# Patient Record
Sex: Female | Born: 1945 | Race: White | Hispanic: No | Marital: Single | Smoking: Never smoker
Health system: Southern US, Community
[De-identification: ages and names within clinical notes are randomized; demographics above are authoritative.]

## PROBLEM LIST (undated history)

## (undated) DIAGNOSIS — N186 End stage renal disease: Secondary | ICD-10-CM

## (undated) DIAGNOSIS — E785 Hyperlipidemia, unspecified: Secondary | ICD-10-CM

## (undated) DIAGNOSIS — I5030 Unspecified diastolic (congestive) heart failure: Secondary | ICD-10-CM

## (undated) DIAGNOSIS — Z9289 Personal history of other medical treatment: Secondary | ICD-10-CM

## (undated) DIAGNOSIS — I779 Disorder of arteries and arterioles, unspecified: Secondary | ICD-10-CM

## (undated) DIAGNOSIS — J449 Chronic obstructive pulmonary disease, unspecified: Secondary | ICD-10-CM

## (undated) DIAGNOSIS — G8929 Other chronic pain: Secondary | ICD-10-CM

## (undated) DIAGNOSIS — T7840XA Allergy, unspecified, initial encounter: Secondary | ICD-10-CM

## (undated) DIAGNOSIS — J45909 Unspecified asthma, uncomplicated: Secondary | ICD-10-CM

## (undated) DIAGNOSIS — I10 Essential (primary) hypertension: Secondary | ICD-10-CM

## (undated) DIAGNOSIS — Z95828 Presence of other vascular implants and grafts: Secondary | ICD-10-CM

## (undated) DIAGNOSIS — E118 Type 2 diabetes mellitus with unspecified complications: Secondary | ICD-10-CM

## (undated) DIAGNOSIS — I2510 Atherosclerotic heart disease of native coronary artery without angina pectoris: Secondary | ICD-10-CM

## (undated) DIAGNOSIS — G4730 Sleep apnea, unspecified: Secondary | ICD-10-CM

## (undated) DIAGNOSIS — I350 Nonrheumatic aortic (valve) stenosis: Secondary | ICD-10-CM

## (undated) DIAGNOSIS — D638 Anemia in other chronic diseases classified elsewhere: Secondary | ICD-10-CM

## (undated) DIAGNOSIS — Z992 Dependence on renal dialysis: Secondary | ICD-10-CM

## (undated) DIAGNOSIS — I509 Heart failure, unspecified: Secondary | ICD-10-CM

## (undated) DIAGNOSIS — M549 Dorsalgia, unspecified: Secondary | ICD-10-CM

## (undated) DIAGNOSIS — F419 Anxiety disorder, unspecified: Secondary | ICD-10-CM

## (undated) DIAGNOSIS — M109 Gout, unspecified: Secondary | ICD-10-CM

## (undated) DIAGNOSIS — L988 Other specified disorders of the skin and subcutaneous tissue: Secondary | ICD-10-CM

## (undated) DIAGNOSIS — I700 Atherosclerosis of aorta: Secondary | ICD-10-CM

## (undated) DIAGNOSIS — K219 Gastro-esophageal reflux disease without esophagitis: Secondary | ICD-10-CM

## (undated) DIAGNOSIS — N289 Disorder of kidney and ureter, unspecified: Secondary | ICD-10-CM

## (undated) HISTORY — PX: GALLBLADDER SURGERY: SHX652

## (undated) HISTORY — DX: Anxiety disorder, unspecified: F41.9

## (undated) HISTORY — DX: Type 2 diabetes mellitus with unspecified complications: E11.8

## (undated) HISTORY — DX: Unspecified diastolic (congestive) heart failure: I50.30

## (undated) HISTORY — DX: Other chronic pain: G89.29

## (undated) HISTORY — DX: Allergy, unspecified, initial encounter: T78.40XA

## (undated) HISTORY — PX: OTHER SURGICAL HISTORY: SHX169

## (undated) HISTORY — DX: Heart failure, unspecified: I50.9

## (undated) HISTORY — DX: Essential (primary) hypertension: I10

## (undated) HISTORY — PX: CARDIAC CATHETERIZATION: SHX172

## (undated) HISTORY — DX: Personal history of other medical treatment: Z92.89

## (undated) HISTORY — DX: Chronic obstructive pulmonary disease, unspecified: J44.9

## (undated) HISTORY — DX: Anemia in other chronic diseases classified elsewhere: D63.8

## (undated) HISTORY — DX: Dorsalgia, unspecified: M54.9

## (undated) HISTORY — DX: End stage renal disease: N18.6

## (undated) HISTORY — DX: Unspecified asthma, uncomplicated: J45.909

## (undated) HISTORY — DX: Disorder of arteries and arterioles, unspecified: I77.9

## (undated) HISTORY — DX: Hyperlipidemia, unspecified: E78.5

## (undated) HISTORY — DX: Atherosclerosis of aorta: I70.0

## (undated) HISTORY — DX: Dependence on renal dialysis: Z99.2

---
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
## 2004-10-25 ENCOUNTER — Ambulatory Visit: Payer: Self-pay | Admitting: Physician Assistant

## 2005-02-21 ENCOUNTER — Ambulatory Visit: Payer: Self-pay | Admitting: Physician Assistant

## 2005-04-24 ENCOUNTER — Ambulatory Visit: Payer: Self-pay | Admitting: Internal Medicine

## 2005-05-03 ENCOUNTER — Ambulatory Visit: Payer: Self-pay | Admitting: Internal Medicine

## 2005-05-12 ENCOUNTER — Other Ambulatory Visit: Payer: Self-pay

## 2005-05-16 ENCOUNTER — Ambulatory Visit: Payer: Self-pay | Admitting: General Surgery

## 2005-06-20 ENCOUNTER — Ambulatory Visit: Payer: Self-pay | Admitting: Physician Assistant

## 2005-06-28 ENCOUNTER — Encounter: Payer: Self-pay | Admitting: Physician Assistant

## 2005-06-30 ENCOUNTER — Ambulatory Visit: Payer: Self-pay | Admitting: Internal Medicine

## 2005-07-07 ENCOUNTER — Ambulatory Visit: Payer: Self-pay | Admitting: Internal Medicine

## 2005-07-21 ENCOUNTER — Encounter: Payer: Self-pay | Admitting: Physician Assistant

## 2005-07-28 ENCOUNTER — Ambulatory Visit: Payer: Self-pay | Admitting: Internal Medicine

## 2005-10-19 ENCOUNTER — Ambulatory Visit: Payer: Self-pay | Admitting: Physician Assistant

## 2006-01-17 ENCOUNTER — Ambulatory Visit: Payer: Self-pay | Admitting: Pain Medicine

## 2006-02-01 ENCOUNTER — Ambulatory Visit: Payer: Self-pay | Admitting: Pain Medicine

## 2006-02-13 ENCOUNTER — Ambulatory Visit: Payer: Self-pay | Admitting: Physician Assistant

## 2006-03-12 ENCOUNTER — Ambulatory Visit: Payer: Self-pay | Admitting: Physician Assistant

## 2006-03-16 ENCOUNTER — Ambulatory Visit: Payer: Self-pay | Admitting: Internal Medicine

## 2006-03-29 ENCOUNTER — Ambulatory Visit: Payer: Self-pay | Admitting: Internal Medicine

## 2006-05-15 ENCOUNTER — Ambulatory Visit: Payer: Self-pay

## 2006-05-18 ENCOUNTER — Ambulatory Visit: Payer: Self-pay | Admitting: General Surgery

## 2006-05-28 ENCOUNTER — Ambulatory Visit: Payer: Self-pay | Admitting: Physician Assistant

## 2006-06-06 ENCOUNTER — Ambulatory Visit: Payer: Self-pay | Admitting: Unknown Physician Specialty

## 2006-08-16 ENCOUNTER — Encounter: Payer: Self-pay | Admitting: Rheumatology

## 2006-08-20 ENCOUNTER — Encounter: Payer: Self-pay | Admitting: Rheumatology

## 2006-08-20 ENCOUNTER — Ambulatory Visit: Payer: Self-pay | Admitting: Pain Medicine

## 2006-09-17 ENCOUNTER — Ambulatory Visit: Payer: Self-pay | Admitting: Specialist

## 2006-09-20 ENCOUNTER — Encounter: Payer: Self-pay | Admitting: Rheumatology

## 2006-11-21 ENCOUNTER — Ambulatory Visit: Payer: Self-pay | Admitting: Physician Assistant

## 2007-01-14 ENCOUNTER — Ambulatory Visit: Payer: Self-pay | Admitting: Pain Medicine

## 2007-01-24 ENCOUNTER — Ambulatory Visit: Payer: Self-pay | Admitting: Pain Medicine

## 2007-02-18 ENCOUNTER — Ambulatory Visit: Payer: Self-pay | Admitting: Physician Assistant

## 2007-02-26 ENCOUNTER — Ambulatory Visit: Payer: Self-pay | Admitting: Specialist

## 2007-02-28 ENCOUNTER — Ambulatory Visit: Payer: Self-pay | Admitting: Pain Medicine

## 2007-03-18 ENCOUNTER — Ambulatory Visit: Payer: Self-pay | Admitting: Pain Medicine

## 2007-03-25 ENCOUNTER — Ambulatory Visit: Payer: Self-pay | Admitting: Pain Medicine

## 2007-04-30 ENCOUNTER — Ambulatory Visit: Payer: Self-pay | Admitting: Pain Medicine

## 2007-05-15 ENCOUNTER — Ambulatory Visit: Payer: Self-pay | Admitting: Physician Assistant

## 2007-06-06 ENCOUNTER — Ambulatory Visit: Payer: Self-pay | Admitting: Pain Medicine

## 2007-07-01 ENCOUNTER — Ambulatory Visit: Payer: Self-pay | Admitting: Pain Medicine

## 2007-07-11 ENCOUNTER — Ambulatory Visit: Payer: Self-pay | Admitting: Pain Medicine

## 2007-08-12 ENCOUNTER — Ambulatory Visit: Payer: Self-pay | Admitting: Pain Medicine

## 2007-10-07 ENCOUNTER — Ambulatory Visit: Payer: Self-pay | Admitting: Physician Assistant

## 2007-11-04 ENCOUNTER — Ambulatory Visit: Payer: Self-pay | Admitting: Physician Assistant

## 2007-11-28 ENCOUNTER — Ambulatory Visit: Payer: Self-pay | Admitting: Pain Medicine

## 2007-12-30 ENCOUNTER — Ambulatory Visit: Payer: Self-pay | Admitting: Physician Assistant

## 2008-01-14 ENCOUNTER — Encounter: Payer: Self-pay | Admitting: Physician Assistant

## 2008-01-19 ENCOUNTER — Encounter: Payer: Self-pay | Admitting: Physician Assistant

## 2008-01-30 ENCOUNTER — Ambulatory Visit: Payer: Self-pay | Admitting: Physician Assistant

## 2008-02-03 ENCOUNTER — Ambulatory Visit: Payer: Self-pay | Admitting: Internal Medicine

## 2008-04-07 ENCOUNTER — Ambulatory Visit: Payer: Self-pay | Admitting: Physician Assistant

## 2008-06-15 ENCOUNTER — Ambulatory Visit: Payer: Self-pay | Admitting: Physician Assistant

## 2008-07-28 ENCOUNTER — Ambulatory Visit: Payer: Self-pay | Admitting: Physician Assistant

## 2008-08-17 ENCOUNTER — Ambulatory Visit: Payer: Self-pay | Admitting: Internal Medicine

## 2008-09-02 ENCOUNTER — Ambulatory Visit: Payer: Self-pay | Admitting: Specialist

## 2008-11-24 ENCOUNTER — Ambulatory Visit: Payer: Self-pay | Admitting: Physician Assistant

## 2008-12-24 ENCOUNTER — Emergency Department: Payer: Self-pay

## 2008-12-31 ENCOUNTER — Encounter: Payer: Self-pay | Admitting: Unknown Physician Specialty

## 2009-03-02 ENCOUNTER — Ambulatory Visit: Payer: Self-pay | Admitting: Physician Assistant

## 2009-04-28 ENCOUNTER — Ambulatory Visit: Payer: Self-pay | Admitting: Physician Assistant

## 2009-07-28 ENCOUNTER — Ambulatory Visit: Payer: Self-pay | Admitting: Physician Assistant

## 2009-11-09 ENCOUNTER — Ambulatory Visit: Payer: Self-pay | Admitting: Physician Assistant

## 2009-12-06 ENCOUNTER — Ambulatory Visit: Payer: Self-pay | Admitting: Pain Medicine

## 2009-12-29 ENCOUNTER — Ambulatory Visit: Payer: Self-pay | Admitting: Pain Medicine

## 2010-02-25 ENCOUNTER — Encounter: Payer: Self-pay | Admitting: Internal Medicine

## 2010-03-20 ENCOUNTER — Encounter: Payer: Self-pay | Admitting: Internal Medicine

## 2010-04-11 ENCOUNTER — Ambulatory Visit: Payer: Self-pay | Admitting: Internal Medicine

## 2010-04-20 ENCOUNTER — Encounter: Payer: Self-pay | Admitting: Internal Medicine

## 2010-07-22 ENCOUNTER — Ambulatory Visit: Payer: Self-pay | Admitting: Internal Medicine

## 2011-07-10 ENCOUNTER — Encounter: Payer: Self-pay | Admitting: Physician Assistant

## 2011-07-22 ENCOUNTER — Encounter: Payer: Self-pay | Admitting: Physician Assistant

## 2011-08-21 ENCOUNTER — Encounter: Payer: Self-pay | Admitting: Physician Assistant

## 2011-11-06 ENCOUNTER — Ambulatory Visit: Payer: Self-pay | Admitting: Internal Medicine

## 2012-08-19 ENCOUNTER — Ambulatory Visit: Payer: Self-pay | Admitting: General Practice

## 2012-08-23 ENCOUNTER — Ambulatory Visit: Payer: Self-pay | Admitting: General Practice

## 2013-03-18 ENCOUNTER — Ambulatory Visit: Payer: Self-pay

## 2013-04-10 ENCOUNTER — Ambulatory Visit: Payer: Self-pay | Admitting: Unknown Physician Specialty

## 2013-06-11 ENCOUNTER — Encounter: Payer: Self-pay | Admitting: Otolaryngology

## 2013-06-16 ENCOUNTER — Emergency Department: Payer: Self-pay | Admitting: Emergency Medicine

## 2013-06-20 ENCOUNTER — Encounter: Payer: Self-pay | Admitting: Otolaryngology

## 2013-07-21 ENCOUNTER — Encounter: Payer: Self-pay | Admitting: Otolaryngology

## 2013-09-08 ENCOUNTER — Emergency Department: Payer: Self-pay | Admitting: Emergency Medicine

## 2014-01-22 ENCOUNTER — Ambulatory Visit (INDEPENDENT_AMBULATORY_CARE_PROVIDER_SITE_OTHER): Payer: Medicare Other | Admitting: Podiatry

## 2014-01-22 ENCOUNTER — Ambulatory Visit (INDEPENDENT_AMBULATORY_CARE_PROVIDER_SITE_OTHER): Payer: Medicare Other

## 2014-01-22 ENCOUNTER — Encounter: Payer: Self-pay | Admitting: Podiatry

## 2014-01-22 VITALS — BP 144/83 | HR 85 | Resp 16 | Ht <= 58 in | Wt 144.0 lb

## 2014-01-22 DIAGNOSIS — M722 Plantar fascial fibromatosis: Secondary | ICD-10-CM

## 2014-01-22 DIAGNOSIS — M79676 Pain in unspecified toe(s): Secondary | ICD-10-CM

## 2014-01-22 DIAGNOSIS — M795 Residual foreign body in soft tissue: Secondary | ICD-10-CM

## 2014-01-22 DIAGNOSIS — M79609 Pain in unspecified limb: Secondary | ICD-10-CM

## 2014-01-22 NOTE — Progress Notes (Signed)
Need and injection in both arches and also have a piece of glass in my left big toe that has been there since two weeks ago Sunday. She denies fever chills nausea vomiting muscle aches and pains. She states that she has recently been placed on the kidney transplant list at [facility]. She's also complaining of plantar fasciitis bilateral.  Objective: Vital signs are stable she is alert and oriented x3. Pulses are palpable bilateral foot. Hallux left does not demonstrate any erythema edema cellulitis drainage or odor. However we are able to see where a small piece of glass in her foot. Radiographic evaluation does demonstrate a stained-glass or leg glass within the skin and superficial tissues. I see no signs of skin right down gases or bone infection. Pain on palpation medial continued tubercles bilateral.  Assessment: Painful foreign body hallux left. Plantar fasciitis bilateral.   Plan: Discussed etiology pathology conservative versus surgical therapies. After initial evaluation of the glasses determined it was deep within the tissues. We did provide and local anesthesia to the hallux in the form of lidocaine and Marcaine 50-50 mixture total of 3 cc was utilized. The toe was then prepped and draped in is normal sterile fashion and the glass was removed. The area was then dressed with antibiotic ointment a dry sterile compressive dressing. I injected her bilateral heels with Kenalog and local anesthetic. She will start soaking on a twice a day basis and Betadine and water she will watch for signs and symptoms of infection. She will notify us with any questions or concerns.

## 2014-02-16 ENCOUNTER — Ambulatory Visit (INDEPENDENT_AMBULATORY_CARE_PROVIDER_SITE_OTHER): Payer: Medicare Other | Admitting: Podiatry

## 2014-02-16 VITALS — Resp 16 | Ht <= 58 in | Wt 144.0 lb

## 2014-02-16 DIAGNOSIS — M795 Residual foreign body in soft tissue: Secondary | ICD-10-CM

## 2014-02-16 NOTE — Progress Notes (Signed)
She presents today for followup of her painful foreign body hallux left.  Objective: Vital signs are stable she is alert and oriented x3. She still has pain on palpation to the area from which the glass was removed. It is tender on palpation today I pain and it with Betadine and debrided the lesions once again removing a small piece of glass.  Assessment: Retention remnant of foreign body hallux left.  Plan: Debridement of foreign body today. She will soak in Betadine and water or Epsom salts warm water and cover with a Band-Aid. She will washes signs and symptoms of infection if there are any she will notify me immediately.

## 2014-07-31 ENCOUNTER — Ambulatory Visit: Payer: Self-pay | Admitting: Internal Medicine

## 2014-11-02 ENCOUNTER — Ambulatory Visit (INDEPENDENT_AMBULATORY_CARE_PROVIDER_SITE_OTHER): Payer: Medicare Other | Admitting: Podiatry

## 2014-11-02 VITALS — BP 130/70 | HR 91 | Resp 16

## 2014-11-02 DIAGNOSIS — B351 Tinea unguium: Secondary | ICD-10-CM

## 2014-11-02 DIAGNOSIS — M722 Plantar fascial fibromatosis: Secondary | ICD-10-CM | POA: Diagnosis not present

## 2014-11-02 DIAGNOSIS — M79676 Pain in unspecified toe(s): Secondary | ICD-10-CM

## 2014-11-02 NOTE — Progress Notes (Signed)
She presents today bilateral heel pain states that she like to have another set of injections if possible also complaining of painful elongated toenails 1 through 5 bilateral.  Objective: Vital signs are stable she is alert and oriented 3. Pulses are strongly palpable bilateral. Pain on palpation medially continue tubercles bilateral. Nails are thick yellow dystrophic and mycotic bilateral.  Assessment: Pain in limb secondary to onychomycosis 1 through 5 bilateral. Plantar fasciitis bilateral.  Plan: Injected bilateral heels today with Kenalog and local anesthetic and debrided nails 1 through 5 bilateral. Service secondary to pain and diabetes.

## 2014-12-04 ENCOUNTER — Inpatient Hospital Stay: Payer: Self-pay | Admitting: Internal Medicine

## 2014-12-04 LAB — CBC
HCT: 25.9 % — ABNORMAL LOW (ref 35.0–47.0)
HGB: 8.2 g/dL — ABNORMAL LOW (ref 12.0–16.0)
MCH: 29.1 pg (ref 26.0–34.0)
MCHC: 31.7 g/dL — ABNORMAL LOW (ref 32.0–36.0)
MCV: 92 fL (ref 80–100)
Platelet: 141 10*3/uL — ABNORMAL LOW (ref 150–440)
RBC: 2.82 10*6/uL — ABNORMAL LOW (ref 3.80–5.20)
RDW: 15 % — ABNORMAL HIGH (ref 11.5–14.5)
WBC: 13.4 10*3/uL — ABNORMAL HIGH (ref 3.6–11.0)

## 2014-12-04 LAB — HEMOGLOBIN A1C: Hemoglobin A1C: 4.9 % (ref 4.2–6.3)

## 2014-12-04 LAB — BASIC METABOLIC PANEL
Anion Gap: 13 (ref 7–16)
BUN: 90 mg/dL — ABNORMAL HIGH (ref 7–18)
Calcium, Total: 8.2 mg/dL — ABNORMAL LOW (ref 8.5–10.1)
Chloride: 111 mmol/L — ABNORMAL HIGH (ref 98–107)
Co2: 16 mmol/L — ABNORMAL LOW (ref 21–32)
Creatinine: 9.39 mg/dL — ABNORMAL HIGH (ref 0.60–1.30)
EGFR (African American): 5 — ABNORMAL LOW
EGFR (Non-African Amer.): 4 — ABNORMAL LOW
Glucose: 125 mg/dL — ABNORMAL HIGH (ref 65–99)
Osmolality: 308 (ref 275–301)
Potassium: 4.7 mmol/L (ref 3.5–5.1)
Sodium: 140 mmol/L (ref 136–145)

## 2014-12-04 LAB — CK TOTAL AND CKMB (NOT AT ARMC)
CK, Total: 54 U/L (ref 26–192)
CK, Total: 49 U/L (ref 26–192)
CK, Total: 45 U/L (ref 26–192)
CK-MB: 2.1 ng/mL (ref 0.5–3.6)
CK-MB: 2 ng/mL (ref 0.5–3.6)
CK-MB: 1.8 ng/mL (ref 0.5–3.6)

## 2014-12-04 LAB — TROPONIN I
Troponin-I: 0.04 ng/mL
Troponin-I: 0.05 ng/mL
Troponin-I: 0.06 ng/mL — ABNORMAL HIGH

## 2014-12-04 LAB — PROTIME-INR
INR: 1.1
Prothrombin Time: 14.4 secs (ref 11.5–14.7)

## 2014-12-04 LAB — PRO B NATRIURETIC PEPTIDE: B-Type Natriuretic Peptide: 22913 pg/mL — ABNORMAL HIGH (ref 0–125)

## 2014-12-05 LAB — BASIC METABOLIC PANEL
Anion Gap: 14 (ref 7–16)
BUN: 98 mg/dL — ABNORMAL HIGH (ref 7–18)
Calcium, Total: 7.8 mg/dL — ABNORMAL LOW (ref 8.5–10.1)
Chloride: 112 mmol/L — ABNORMAL HIGH (ref 98–107)
Co2: 17 mmol/L — ABNORMAL LOW (ref 21–32)
Creatinine: 9.37 mg/dL — ABNORMAL HIGH (ref 0.60–1.30)
EGFR (African American): 5 — ABNORMAL LOW
EGFR (Non-African Amer.): 4 — ABNORMAL LOW
Glucose: 83 mg/dL (ref 65–99)
Osmolality: 315 (ref 275–301)
Potassium: 4.7 mmol/L (ref 3.5–5.1)
Sodium: 143 mmol/L (ref 136–145)

## 2014-12-05 LAB — CBC WITH DIFFERENTIAL/PLATELET
Basophil #: 0 10*3/uL (ref 0.0–0.1)
Basophil %: 0.3 %
Eosinophil #: 0.1 10*3/uL (ref 0.0–0.7)
Eosinophil %: 1.5 %
HCT: 20.7 % — ABNORMAL LOW (ref 35.0–47.0)
HGB: 6.8 g/dL — ABNORMAL LOW (ref 12.0–16.0)
Lymphocyte #: 0.9 10*3/uL — ABNORMAL LOW (ref 1.0–3.6)
Lymphocyte %: 11.7 %
MCH: 29.6 pg (ref 26.0–34.0)
MCHC: 32.7 g/dL (ref 32.0–36.0)
MCV: 91 fL (ref 80–100)
Monocyte #: 0.7 x10 3/mm (ref 0.2–0.9)
Monocyte %: 8.9 %
Neutrophil #: 5.8 10*3/uL (ref 1.4–6.5)
Neutrophil %: 77.6 %
Platelet: 103 10*3/uL — ABNORMAL LOW (ref 150–440)
RBC: 2.28 10*6/uL — ABNORMAL LOW (ref 3.80–5.20)
RDW: 15.1 % — ABNORMAL HIGH (ref 11.5–14.5)
WBC: 7.5 10*3/uL (ref 3.6–11.0)

## 2014-12-05 LAB — TSH: Thyroid Stimulating Horm: 0.762 u[IU]/mL

## 2014-12-06 LAB — BASIC METABOLIC PANEL
Anion Gap: 12 (ref 7–16)
BUN: 103 mg/dL — ABNORMAL HIGH (ref 7–18)
Calcium, Total: 8.2 mg/dL — ABNORMAL LOW (ref 8.5–10.1)
Chloride: 109 mmol/L — ABNORMAL HIGH (ref 98–107)
Co2: 18 mmol/L — ABNORMAL LOW (ref 21–32)
Creatinine: 9.63 mg/dL — ABNORMAL HIGH (ref 0.60–1.30)
EGFR (African American): 5 — ABNORMAL LOW
EGFR (Non-African Amer.): 4 — ABNORMAL LOW
Glucose: 103 mg/dL — ABNORMAL HIGH (ref 65–99)
Osmolality: 310 (ref 275–301)
Potassium: 4.4 mmol/L (ref 3.5–5.1)
Sodium: 139 mmol/L (ref 136–145)

## 2014-12-06 LAB — CBC WITH DIFFERENTIAL/PLATELET
Basophil #: 0 10*3/uL (ref 0.0–0.1)
Basophil %: 0.3 %
Eosinophil #: 0.2 10*3/uL (ref 0.0–0.7)
Eosinophil %: 3.2 %
HCT: 25 % — ABNORMAL LOW (ref 35.0–47.0)
HGB: 8.2 g/dL — ABNORMAL LOW (ref 12.0–16.0)
Lymphocyte #: 0.9 10*3/uL — ABNORMAL LOW (ref 1.0–3.6)
Lymphocyte %: 12.7 %
MCH: 29.2 pg (ref 26.0–34.0)
MCHC: 32.8 g/dL (ref 32.0–36.0)
MCV: 89 fL (ref 80–100)
Monocyte #: 0.5 x10 3/mm (ref 0.2–0.9)
Monocyte %: 7.4 %
Neutrophil #: 5.4 10*3/uL (ref 1.4–6.5)
Neutrophil %: 76.4 %
Platelet: 114 10*3/uL — ABNORMAL LOW (ref 150–440)
RBC: 2.8 10*6/uL — ABNORMAL LOW (ref 3.80–5.20)
RDW: 14.6 % — ABNORMAL HIGH (ref 11.5–14.5)
WBC: 7.1 10*3/uL (ref 3.6–11.0)

## 2014-12-07 LAB — COMPREHENSIVE METABOLIC PANEL
Albumin: 2.5 g/dL — ABNORMAL LOW (ref 3.4–5.0)
Alkaline Phosphatase: 58 U/L
Anion Gap: 13 (ref 7–16)
BUN: 109 mg/dL — ABNORMAL HIGH (ref 7–18)
Bilirubin,Total: 0.3 mg/dL (ref 0.2–1.0)
Calcium, Total: 8.3 mg/dL — ABNORMAL LOW (ref 8.5–10.1)
Chloride: 108 mmol/L — ABNORMAL HIGH (ref 98–107)
Co2: 19 mmol/L — ABNORMAL LOW (ref 21–32)
Creatinine: 9.64 mg/dL — ABNORMAL HIGH (ref 0.60–1.30)
EGFR (African American): 5 — ABNORMAL LOW
EGFR (Non-African Amer.): 4 — ABNORMAL LOW
Glucose: 103 mg/dL — ABNORMAL HIGH (ref 65–99)
Osmolality: 314 (ref 275–301)
Potassium: 4.2 mmol/L (ref 3.5–5.1)
SGOT(AST): 18 U/L (ref 15–37)
SGPT (ALT): 18 U/L
Sodium: 140 mmol/L (ref 136–145)
Total Protein: 6 g/dL — ABNORMAL LOW (ref 6.4–8.2)

## 2014-12-07 LAB — CBC WITH DIFFERENTIAL/PLATELET
Basophil #: 0 10*3/uL (ref 0.0–0.1)
Basophil %: 0.6 %
Eosinophil #: 0.3 10*3/uL (ref 0.0–0.7)
Eosinophil %: 5 %
HCT: 26.1 % — ABNORMAL LOW (ref 35.0–47.0)
HGB: 8.5 g/dL — ABNORMAL LOW (ref 12.0–16.0)
Lymphocyte #: 1 10*3/uL (ref 1.0–3.6)
Lymphocyte %: 15.7 %
MCH: 29.2 pg (ref 26.0–34.0)
MCHC: 32.4 g/dL (ref 32.0–36.0)
MCV: 90 fL (ref 80–100)
Monocyte #: 0.5 x10 3/mm (ref 0.2–0.9)
Monocyte %: 8 %
Neutrophil #: 4.7 10*3/uL (ref 1.4–6.5)
Neutrophil %: 70.7 %
Platelet: 126 10*3/uL — ABNORMAL LOW (ref 150–440)
RBC: 2.9 10*6/uL — ABNORMAL LOW (ref 3.80–5.20)
RDW: 14.4 % (ref 11.5–14.5)
WBC: 6.7 10*3/uL (ref 3.6–11.0)

## 2014-12-07 LAB — PHOSPHORUS: Phosphorus: 8.4 mg/dL — ABNORMAL HIGH (ref 2.5–4.9)

## 2014-12-07 LAB — IRON AND TIBC
Iron Bind.Cap.(Total): 206 ug/dL — ABNORMAL LOW (ref 250–450)
Iron Saturation: 27 %
Iron: 55 ug/dL (ref 50–170)
Unbound Iron-Bind.Cap.: 151 ug/dL

## 2014-12-07 LAB — FERRITIN: Ferritin (ARMC): 387 ng/mL (ref 8–388)

## 2014-12-08 LAB — BASIC METABOLIC PANEL
Anion Gap: 10 (ref 7–16)
Anion Gap: 13 (ref 7–16)
BUN: 79 mg/dL — ABNORMAL HIGH (ref 7–18)
BUN: 79 mg/dL — ABNORMAL HIGH (ref 7–18)
Calcium, Total: 8 mg/dL — ABNORMAL LOW (ref 8.5–10.1)
Calcium, Total: 7.8 mg/dL — ABNORMAL LOW (ref 8.5–10.1)
Chloride: 106 mmol/L (ref 98–107)
Chloride: 104 mmol/L (ref 98–107)
Co2: 26 mmol/L (ref 21–32)
Co2: 24 mmol/L (ref 21–32)
Creatinine: 7.41 mg/dL — ABNORMAL HIGH (ref 0.60–1.30)
Creatinine: 7.71 mg/dL — ABNORMAL HIGH (ref 0.60–1.30)
EGFR (African American): 7 — ABNORMAL LOW
EGFR (African American): 7 — ABNORMAL LOW
EGFR (Non-African Amer.): 6 — ABNORMAL LOW
EGFR (Non-African Amer.): 6 — ABNORMAL LOW
Glucose: 101 mg/dL — ABNORMAL HIGH (ref 65–99)
Glucose: 207 mg/dL — ABNORMAL HIGH (ref 65–99)
Osmolality: 307 (ref 275–301)
Osmolality: 311 (ref 275–301)
Potassium: 3.8 mmol/L (ref 3.5–5.1)
Potassium: 3.6 mmol/L (ref 3.5–5.1)
Sodium: 142 mmol/L (ref 136–145)
Sodium: 141 mmol/L (ref 136–145)

## 2014-12-08 LAB — CBC WITH DIFFERENTIAL/PLATELET
Basophil #: 0 10*3/uL (ref 0.0–0.1)
Basophil %: 0.5 %
Eosinophil #: 0.3 10*3/uL (ref 0.0–0.7)
Eosinophil %: 5.3 %
HCT: 26.3 % — ABNORMAL LOW (ref 35.0–47.0)
HGB: 8.9 g/dL — ABNORMAL LOW (ref 12.0–16.0)
Lymphocyte #: 1.1 10*3/uL (ref 1.0–3.6)
Lymphocyte %: 19.1 %
MCH: 29.7 pg (ref 26.0–34.0)
MCHC: 33.8 g/dL (ref 32.0–36.0)
MCV: 88 fL (ref 80–100)
Monocyte #: 0.7 x10 3/mm (ref 0.2–0.9)
Monocyte %: 11.8 %
Neutrophil #: 3.7 10*3/uL (ref 1.4–6.5)
Neutrophil %: 63.3 %
Platelet: 145 10*3/uL — ABNORMAL LOW (ref 150–440)
RBC: 3 10*6/uL — ABNORMAL LOW (ref 3.80–5.20)
RDW: 14.6 % — ABNORMAL HIGH (ref 11.5–14.5)
WBC: 5.8 10*3/uL (ref 3.6–11.0)

## 2014-12-09 LAB — CBC WITH DIFFERENTIAL/PLATELET
Basophil #: 0 10*3/uL (ref 0.0–0.1)
Basophil %: 0.5 %
Eosinophil #: 0.3 10*3/uL (ref 0.0–0.7)
Eosinophil %: 5.4 %
HCT: 27.6 % — ABNORMAL LOW (ref 35.0–47.0)
HGB: 9.2 g/dL — ABNORMAL LOW (ref 12.0–16.0)
Lymphocyte #: 1.5 10*3/uL (ref 1.0–3.6)
Lymphocyte %: 27.9 %
MCH: 29.7 pg (ref 26.0–34.0)
MCHC: 33.3 g/dL (ref 32.0–36.0)
MCV: 89 fL (ref 80–100)
Monocyte #: 0.6 x10 3/mm (ref 0.2–0.9)
Monocyte %: 12.1 %
Neutrophil #: 2.9 10*3/uL (ref 1.4–6.5)
Neutrophil %: 54.1 %
Platelet: 137 10*3/uL — ABNORMAL LOW (ref 150–440)
RBC: 3.1 10*6/uL — ABNORMAL LOW (ref 3.80–5.20)
RDW: 14.6 % — ABNORMAL HIGH (ref 11.5–14.5)
WBC: 5.4 10*3/uL (ref 3.6–11.0)

## 2014-12-09 LAB — PHOSPHORUS: Phosphorus: 4.4 mg/dL (ref 2.5–4.9)

## 2014-12-10 LAB — CBC WITH DIFFERENTIAL/PLATELET
Basophil #: 0 10*3/uL (ref 0.0–0.1)
Basophil %: 0.6 %
Eosinophil #: 0.4 10*3/uL (ref 0.0–0.7)
Eosinophil %: 6.1 %
HCT: 28.5 % — ABNORMAL LOW (ref 35.0–47.0)
HGB: 9.4 g/dL — ABNORMAL LOW (ref 12.0–16.0)
Lymphocyte #: 1.4 10*3/uL (ref 1.0–3.6)
Lymphocyte %: 24.4 %
MCH: 29.3 pg (ref 26.0–34.0)
MCHC: 33 g/dL (ref 32.0–36.0)
MCV: 89 fL (ref 80–100)
Monocyte #: 0.6 x10 3/mm (ref 0.2–0.9)
Monocyte %: 10.3 %
Neutrophil #: 3.4 10*3/uL (ref 1.4–6.5)
Neutrophil %: 58.6 %
Platelet: 136 10*3/uL — ABNORMAL LOW (ref 150–440)
RBC: 3.21 10*6/uL — ABNORMAL LOW (ref 3.80–5.20)
RDW: 14.1 % (ref 11.5–14.5)
WBC: 5.8 10*3/uL (ref 3.6–11.0)

## 2014-12-10 LAB — BASIC METABOLIC PANEL
Anion Gap: 6 — ABNORMAL LOW (ref 7–16)
BUN: 19 mg/dL — ABNORMAL HIGH (ref 7–18)
Calcium, Total: 8.6 mg/dL (ref 8.5–10.1)
Chloride: 102 mmol/L (ref 98–107)
Co2: 34 mmol/L — ABNORMAL HIGH (ref 21–32)
Creatinine: 3.3 mg/dL — ABNORMAL HIGH (ref 0.60–1.30)
EGFR (African American): 18 — ABNORMAL LOW
EGFR (Non-African Amer.): 15 — ABNORMAL LOW
Glucose: 104 mg/dL — ABNORMAL HIGH (ref 65–99)
Osmolality: 286 (ref 275–301)
Potassium: 3.6 mmol/L (ref 3.5–5.1)
Sodium: 142 mmol/L (ref 136–145)

## 2014-12-17 ENCOUNTER — Ambulatory Visit: Payer: Self-pay | Admitting: Vascular Surgery

## 2015-02-01 ENCOUNTER — Ambulatory Visit (INDEPENDENT_AMBULATORY_CARE_PROVIDER_SITE_OTHER): Payer: Medicare Other | Admitting: Cardiovascular Disease

## 2015-02-01 ENCOUNTER — Encounter: Payer: Self-pay | Admitting: Cardiovascular Disease

## 2015-02-01 VITALS — BP 146/72 | HR 90 | Ht <= 58 in | Wt 136.0 lb

## 2015-02-01 DIAGNOSIS — J45909 Unspecified asthma, uncomplicated: Secondary | ICD-10-CM | POA: Insufficient documentation

## 2015-02-01 DIAGNOSIS — R0602 Shortness of breath: Secondary | ICD-10-CM | POA: Insufficient documentation

## 2015-02-01 DIAGNOSIS — Z992 Dependence on renal dialysis: Secondary | ICD-10-CM | POA: Diagnosis not present

## 2015-02-01 DIAGNOSIS — N186 End stage renal disease: Secondary | ICD-10-CM | POA: Insufficient documentation

## 2015-02-01 DIAGNOSIS — E1169 Type 2 diabetes mellitus with other specified complication: Secondary | ICD-10-CM | POA: Insufficient documentation

## 2015-02-01 DIAGNOSIS — I5032 Chronic diastolic (congestive) heart failure: Secondary | ICD-10-CM

## 2015-02-01 DIAGNOSIS — I5033 Acute on chronic diastolic (congestive) heart failure: Secondary | ICD-10-CM | POA: Insufficient documentation

## 2015-02-01 NOTE — Assessment & Plan Note (Signed)
End-stage renal disease, worse recently now requiring hemodialysis. She indicates that she is on the transplant list at [facility]. Prior echocardiogram and stress test were normal No further testing needed

## 2015-02-01 NOTE — Assessment & Plan Note (Signed)
Managed by Dr. Ginette Pitman. Notes indicate diabetes may have led to her underlying renal dysfunction

## 2015-02-01 NOTE — Assessment & Plan Note (Signed)
Reports feeling well, continues to make urine. No significant hypotension, infect blood pressure typically runs high after dialysis. Suggested she monitor her blood pressure before and after dialysis. Additional medication changes could be made. She is curious about her elevated heart rate. Perhaps carvedilol could be increased if tolerated

## 2015-02-01 NOTE — Patient Instructions (Signed)
You are doing well. No medication changes were made.  Please call us if you have new issues that need to be addressed before your next appt.  Your physician wants you to follow-up in: 6 months.  You will receive a reminder letter in the mail two months in advance. If you don't receive a letter, please call our office to schedule the follow-up appointment.   

## 2015-02-01 NOTE — Assessment & Plan Note (Signed)
No recent shortness of breath symptoms since her hospitalization and start on dialysis. Appears euvolemic

## 2015-02-01 NOTE — Assessment & Plan Note (Signed)
She appears to be relatively euvolemic on hemodialysis. Receiving regular HD 3 days per week. She is not short of breath leading up to dialysis even the night before with no orthostasis or PND. I suspect she is having some reaction while actually having dialysis as after being set up for her procedure she has acute shortness of breath. Suggested she talk about this with Dr. Candiss Norse. Based on her day-to-day performance, I do not think that she needs a lower dry weight

## 2015-02-01 NOTE — Progress Notes (Signed)
Patient ID: Phyllis [name], female    DOB: Dec 19, 1945, 69 y.o.   MRN: [id number]  HPI Comments: [name] is a very pleasant 69 year old woman with history of chronic renal failure, essentially normal echocardiogram and stress test March 2015, with recent admission to the hospital 12/04/2014 for acute respiratory failure with hypoxia, found to have acute on chronic renal failure, 25 pound weight gain, evidence of heart failure. AV fistula was not working on the left and temporary dialysis catheter was placed and dialysis performed 3 in the hospital. She presents today for follow-up of possible heart failure.  She has continued on dialysis on Tuesday Thursday and Saturdays. Monitored by Dr. Candiss Norse. She does not have any shortness of breath in general, even on Monday evenings and Tuesday mornings prior to dialysis. She does report that she needs oxygen when placed on dialysis for acute shortness of breath only during the several hours she is at the center and going through dialysis. Otherwise she does not need oxygen. Denies any leg edema. She continues to make significant urine. Reports sleeping well with no orthopnea or PND. In general has good energy  Review of the records shows stress test 02/12/2014 showing no ischemia Also echocardiogram March 2015 showing normal LV function, no significant valve problems  EKG on today's visit   shows normal sinus rhythm with rate 90 bpm, no significant ST or T-wave changes  Allergies  Allergen Reactions  . Baclofen Other (See Comments)    lightheadness ,drowsiness , muscle weakness , twitching in hands   . Bactrim [Sulfamethoxazole-Trimethoprim] Swelling  . Codeine Nausea And Vomiting  . Macrodantin [Nitrofurantoin Macrocrystal] Swelling  . Neosporin [Neomycin-Bacitracin Zn-Polymyx] Other (See Comments)    Skin irritation   . Quinine Derivatives Other (See Comments)    Vertigo,nausea vomiting blurred vision headache ears sensitivity   . Vasotec [Enalapril] Other (See Comments)    Headaches   . Vicodin [Hydrocodone-Acetaminophen] Nausea And Vomiting  . Zocor [Simvastatin] Other (See Comments)    Muscle pain and spasms  . Lidoderm [Lidocaine] Rash  . Ultram [Tramadol] Palpitations    Outpatient Encounter Prescriptions as of 02/01/2015  Medication Sig  . allopurinol (ZYLOPRIM) 100 MG tablet Take 100 mg by mouth 2 (two) times daily.   Marland Kitchen amLODipine (NORVASC) 10 MG tablet Take 10 mg by mouth.   . budesonide-formoterol (SYMBICORT) 160-4.5 MCG/ACT inhaler Inhale 2 puffs into the lungs 2 (two) times daily.  . carvedilol (COREG) 12.5 MG tablet Take 12.5 mg by mouth 2 (two) times daily with a meal.   . cetirizine (ZYRTEC) 10 MG tablet Take 10 mg by mouth as needed for allergies.  . cyclobenzaprine (FLEXERIL) 10 MG tablet Take 10 mg by mouth 3 (three) times daily as needed for muscle spasms.  Marland Kitchen docusate sodium (COLACE) 100 MG capsule Take 100 mg by mouth 3 (three) times daily as needed for mild constipation.  Marland Kitchen HYDROcodone-acetaminophen (NORCO) 7.5-325 MG per tablet 1 tablet every 4 (four) hours as needed.   Marland Kitchen ipratropium-albuterol (DUONEB) 0.5-2.5 (3) MG/3ML SOLN Take 3 mLs by nebulization.  . mometasone (NASONEX) 50 MCG/ACT nasal spray Place 2 sprays into the nose daily.  Marland Kitchen omeprazole (PRILOSEC) 20 MG capsule Take 20 mg by mouth 2 (two) times daily before a meal.  . pravastatin (PRAVACHOL) 40 MG tablet Take 40 mg by mouth daily.   Marland Kitchen senna (SENOKOT) 8.6 MG tablet Take 1 tablet by mouth daily.  Marland Kitchen topiramate (TOPAMAX) 25 MG tablet Take 25 mg by mouth as  needed.   . VESICARE 5 MG tablet Take 5 mg by mouth daily.   . Vitamin D, Ergocalciferol, (DRISDOL) 50000 UNITS CAPS capsule Take 50,000 Units by mouth every 7 (seven) days.   . [DISCONTINUED] RENVELA 800 MG tablet     Past Medical History  Diagnosis Date  . Diabetes mellitus without complication   . Allergy   . Chronic kidney disease   . Hypertension   . Asthma   . COPD  (chronic obstructive pulmonary disease)   . Dialysis patient     3 days every week.     Past Surgical History  Procedure Laterality Date  . Gallbladder surgery    . Carpel tunnel      Social History  reports that she has never smoked. She has never used smokeless tobacco. She reports that she does not drink alcohol or use illicit drugs.  Family History family history includes Heart attack in her brother; Heart attack (age of onset: 9) in her father; Heart disease in her brother and father; Hyperlipidemia in her father and mother; Hypertension in her father and mother.       Review of Systems  Constitutional: Negative.   HENT: Negative.   Respiratory: Negative.   Cardiovascular: Negative.   Gastrointestinal: Negative.   Musculoskeletal: Negative.   Skin: Negative.   Neurological: Negative.   Hematological: Negative.   Psychiatric/Behavioral: Negative.   All other systems reviewed and are negative.   BP 146/72 mmHg  Pulse 90  Ht 4\' 10"  (1.473 m)  Wt 136 lb (61.689 kg)  BMI 28.43 kg/m2  Physical Exam  Constitutional: She is oriented to person, place, and time. She appears well-developed and well-nourished.  HENT:  Head: Normocephalic.  Nose: Nose normal.  Mouth/Throat: Oropharynx is clear and moist.  Eyes: Conjunctivae are normal. Pupils are equal, round, and reactive to light.  Neck: Normal range of motion. Neck supple. No JVD present.  Cardiovascular: Normal rate, regular rhythm, S1 normal, S2 normal, normal heart sounds and intact distal pulses.  Exam reveals no gallop and no friction rub.   No murmur heard. Pulmonary/Chest: Effort normal and breath sounds normal. No respiratory distress. She has no wheezes. She has no rales. She exhibits no tenderness.  Abdominal: Soft. Bowel sounds are normal. She exhibits no distension. There is no tenderness.  Musculoskeletal: Normal range of motion. She exhibits no edema or tenderness.  Lymphadenopathy:    She has no  cervical adenopathy.  Neurological: She is alert and oriented to person, place, and time. Coordination normal.  Skin: Skin is warm and dry. No rash noted. No erythema.  Psychiatric: She has a normal mood and affect. Her behavior is normal. Judgment and thought content normal.    Assessment and Plan  Nursing note and vitals reviewed.

## 2015-02-18 ENCOUNTER — Ambulatory Visit
Admit: 2015-02-18 | Disposition: A | Discharge status: Home / Self Care | Payer: Self-pay | Attending: Vascular Surgery | Admitting: Vascular Surgery

## 2015-03-09 NOTE — Op Note (Signed)
PATIENT NAME:  [name], Phyllis [name] MR#:  [id number] DATE OF BIRTH:  [birth year]  DATE OF PROCEDURE:  08/23/2012  PREOPERATIVE DIAGNOSIS: Right carpal tunnel syndrome.   POSTOPERATIVE DIAGNOSIS: Right carpal tunnel syndrome.    PROCEDURE PERFORMED: Right carpal tunnel release.   SURGEON: Laurice Record. Holley Bouche., MD   ANESTHESIA: General.   ESTIMATED BLOOD LOSS: Minimal.   TOURNIQUET TIME: 21 minutes.   DRAINS: None.   INDICATIONS FOR SURGERY: The patient is a 69 year old female who has been seen for complaints of numbness and pain to the hands with the right hand more symptomatic than the left. The EMG nerve conduction studies were consistent with severe carpal tunnel syndrome to the right with more mild to moderate changes to the left. After discussion of the risks and benefits of surgical intervention, the patient expressed her understanding of the risks and benefits and agreed with plans for surgical intervention.   PROCEDURE IN DETAIL: The patient was brought in the operating room and, after adequate general anesthesia was achieved, a tourniquet was placed on the patient's upper right arm. The patient's right hand and arm were cleaned and prepped with alcohol and DuraPrep and draped in the usual sterile fashion. A "time-out" was performed as per usual protocol. Loupe magnification was used throughout the procedure. The right upper extremity was exsanguinated using an Esmarch and the tourniquet was inflated to 250 mmHg. A curvilinear incision was made just ulnar to the thenar palmar crease. Dissection was carried down through the palmar fascia of the transverse carpal ligament. Transverse carpal ligament was sharply incised, taking care to protect the underlying structures within the carpal tunnel. Complete release of the transverse carpal ligament was achieved. Inspection of the tunnel showed no evidence of lipoma or ganglion cyst. There was initially a fusiform shape to the median nerve  consistent with compression. The wound was irrigated with copious amounts of fluid. Wound edges were reapproximated using interrupted sutures of #5-0 nylon. 0.25% Marcaine was injected along the incision site. A sterile dressing was applied followed by application of a volar splint. Tourniquet was deflated after a total tourniquet time of 20 minutes.   The patient tolerated the procedure well. She was transported to the recovery room in stable condition.   ____________________________ Laurice Record. Holley Bouche., MD jph:drc D: 08/23/2012 08:19:29 ET T: 08/23/2012 08:35:56 ET JOB#: [id number]  cc: Laurice Record. Holley Bouche., MD, <Dictator> Laurice Record Holley Bouche MD ELECTRONICALLY SIGNED 08/23/2012 16:35

## 2015-03-21 NOTE — H&P (Signed)
PATIENT NAME:  Phyllis [name], Phyllis [name] MR#:  [id number] DATE OF BIRTH:  02-07-1946  DATE OF ADMISSION:  12/04/2014  REFERRING PHYSICIAN:  Gregor Hams, M.D.   PRIMARY CARE PHYSICIAN:  [name], MD   ADMISSION DIAGNOSIS: Acute respiratory failure with hypoxemia.   HISTORY OF PRESENT ILLNESS: This is a 69 year old Caucasian female who presents to the Emergency Department via EMS for acute shortness of breath. The patient states that her symptoms began last night when she felt as if she were having an asthma attack.  She had some inhalers at home, but admits that they are old. She tried to push through her symptoms, but gradually it became harder and harder to breathe.  Once she reached point where she could not take more than a few steps without severe shortness of breath and air hunger, she called EMS. The patient denies having any associated chest pain, nausea, vomiting or diaphoresis with this episode of shortness of breath.  In the Emergency Department, the patient arrived on CPAP, but was initially found to have O2 saturation in the 50% range.  After receiving multiple DuoNebs through the BiPAP circuit, the patient was feeling much better and her sats had improved.  She revealed to me that she also has congestive heart failure and is approximately 25 pounds above her dry weight. Due to the need for continued respiratory care, as well as management of fluid overload, the Emergency Department called for admission.   REVIEW OF SYSTEMS:  CONSTITUTIONAL: The patient denies fever, but admits to fatigue.  EYES: Denies blurred vision or inflammation.  EARS, NOSE AND THROAT: Denies tinnitus or sore throat.  RESPIRATORY: Admits to shortness of breath, but denies cough.  CARDIOVASCULAR: Denies chest pain, palpitations, orthopnea, or paroxysmal nocturnal dyspnea.  GASTROINTESTINAL: Denies nausea, vomiting, diarrhea, or abdominal pain.  GENITOURINARY: The patient makes urine. She denies dysuria,  increased frequency, or hesitancy of urination.  ENDOCRINE: Denies polyuria, polydipsia.  HEMATOLOGIC AND LYMPHATIC: Denies easy bruising or bleeding.  INTEGUMENT: Denies rashes or lesions.  MUSCULOSKELETAL: Denies arthralgias or myalgias.  NEUROLOGIC: Denies numbness in her extremities, (but admits to a burning sensation in her feet associated with neuropathy).  She also denies dysarthria.  PSYCHIATRIC: Denies depression or suicidal ideation.   PAST MEDICAL HISTORY: Congestive heart failure, presumably systolic, chronic kidney disease, diabetic neuropathy, diabetes type 2 and asthma.   PAST SURGICAL HISTORY: Fistula placement in the left forearm.   SOCIAL HISTORY: The patient lives with a friend of hers. She does not smoke, drink, or do any drugs.   FAMILY HISTORY: Significant for coronary artery disease, particularly in her brother who died fairly young from a myocardial infarction.   MEDICATIONS:  1.  Acetaminophen with hydrocodone 325 mg-7.5 mg tablet 1 tablet p.o. 4 times a day.  2.  Topiramate 25 mg 1 tablet orally continuously as needed (directions need to be clarified).  3.  Allopurinol 100 mg 1 tablet p.o. b.i.d.  4.  Cetirizine 10 mg 1 tab p.o. daily.  5.  Carvedilol 12.5 mg 1 tablet p.o. b.i.d.  6.  Combivent 2 puffs inhaled b.i.d. as needed for shortness of breath.  7.  Amlodipine 10 mg 1 tablet p.o. daily.  8.  Docusate sodium 100 mg 1 capsule p.o. t.i.d.  9.  Omeprazole 20 mg delayed release capsule 1 capsule p.o. b.i.d.  10. VESIcare 1 tablet p.o. once daily.  11. Vitamin D2 at 50,000 international units 1 capsule p.o. once per week.  12. Pravastatin 40 mg 1  tablet p.o. at bedtime.   ALLERGIES: BACLOFEN, BACTRIM, CODEINE, LIDODERM, NEOSPORIN, QUININE, ULTRAM. VASOTEC, ZOCOR AND MACRODANTIN.   PERTINENT LABORATORY RESULTS AND RADIOGRAPHIC FINDINGS: Serum glucose is 125. BNP is 22,913, BUN 90, creatinine 9.39, serum sodium 140, potassium is 4.7, chloride is 111, bicarb  16, calcium is 8.2. Troponin is 0.04. White blood cell count is 13.4, hemoglobin 8.2, hematocrit is 25.9, platelet count 141,000.  MCV is 92, ABG shows a pH of 7.3,  pCO2 of 31, PO2 of 133, with an FiO2 of 40, base excess of -10 and that is on BiPAP with a PEEP of 5, mechanical rate of 12. Chest x-ray shows patchy right-sided air space opacification, which raises concern for pneumonia.  The radiologist comments that this is an appearance less typical for interstitial edema.   PHYSICAL EXAMINATION:  VITAL SIGNS: Temperature is not documented at this time, pulse 103, respirations 38, blood pressure is 169/74, pulse oximetry is 100% on 3 liters of oxygen via BiPAP.  GENERAL: The patient is alert and oriented x 3 in no apparent distress.  HEENT: Normocephalic, atraumatic.  Pupils equal, round, and reactive to light and accommodation. Extraocular movements are intact. Mucous membranes are moist.  NECK: Trachea is midline. No adenopathy. Thyroid is nonpalpable and nontender.  CHEST: Symmetric and atraumatic.  CARDIOVASCULAR: Tachycardic. Normal rhythm. Normal S1, S2. No rubs, clicks, or murmurs appreciated.  LUNGS: Clear to auscultation bilaterally. The patient is wearing BiPAP.  ABDOMEN: Positive bowel sounds. Soft, nontender, nondistended. No hepatosplenomegaly.  GENITOURINARY: Deferred.  MUSCULOSKELETAL: The patient moves all 4 extremities equally. There is good range of motion, but I have not done strength testing.  SKIN: Warm and dry. No rashes or lesions.  EXTREMITIES: No clubbing or cyanosis. The patient does have some nonpitting trace edema of her lower extremities.  NEUROLOGIC: Cranial nerves II through XII are grossly intact.  PSYCHIATRIC: Mood is normal. Affect is congruent. The patient has good insight and judgment into her medical condition.   ASSESSMENT AND PLAN: This is an 69 year old female admitted for acute respiratory failure with hypoxemia.  1.  Acute respiratory failure. The patient  likely has some form of chronic lung disease. She carries the diagnosis of chronic obstructive pulmonary disease on her triage intake notations, but the patient states that she has asthma.  I feel like the latter is more likely as she has remarkably good air movement at this time without any auto PEEP.  Her lungs also do not have the appearance of hyperinflation seen in chronic obstructive disease.  Her oxygen saturations have markedly improved following multiple DuoNebs and she is feeling much more comfortable. This exacerbation may have been caused by congestive heart failure and the improvement seen now on my physical examination is likely the result of IV Lasix that she is has already received.   We will continue to give her DuoNebs as needed for respiratory support.  There is a concern about pneumonia in the right middle or right lower lobe of the patient's lungs.   I will give her a dose of Zosyn and azithromycin.  If her respiratory status improves greatly in under 24 hours, we will know that some of that opacification seen on chest x-ray is likely fluid and not infiltrate and will thus not have to continue with pneumonia treatment.    2.  Congestive heart failure. The patient reports being 25 pounds over her dry weight from 1 month ago and up 15 pounds since last week.  She has already received  some Lasix in the Emergency Department, but she has very tenuous renal function, so we will have to diurese her gently.  3.  Chronic kidney disease, Stage V.  The patient has a mature fistula in her left arm. She does not require dialysis at the moment as potassium is normal and respiratory function has improved. It does not look like she has terrible pulmonary edema at this time. Thus, I will hold off diuresing her further without input from nephrology.  She usually sees her nephrologist at [facility].   4.  Diabetes type 2, hold oral hypoglycemics. We will check the patient's hemoglobin  A1c and I have started a sliding scale insulin while she is hospitalized.  5.  Neuropathy. Continue gabapentin.  6.  Asthma.  Excellent air movement now. She may need a maintenance inhaler or further trigger modification to prevent future recurrences.  7.  Deep vein thrombosis prophylaxis. Heparin.  8.  Gastrointestinal prophylaxis: None.      CODE STATUS:  The patient is a FULL CODE.   TIME SPENT ON ADMISSION ORDERS AND CRITICAL PATIENT CARE: Approximately 45 minutes.     ____________________________ Norva Riffle. Marcille Blanco, MD msd:DT D: 12/04/2014 07:46:55 ET T: 12/04/2014 08:31:47 ET JOB#: [id number]  cc: Norva Riffle. Marcille Blanco, MD, <Dictator> Norva Riffle Saleem Coccia MD ELECTRONICALLY SIGNED 12/15/2014 2:53

## 2015-03-21 NOTE — Op Note (Signed)
PATIENT NAME:  Phyllis [name], Phyllis [name] MR#:  [id number] DATE OF BIRTH:  1946/09/30  DATE OF PROCEDURE:  12/07/2014  PREOPERATIVE DIAGNOSES:   1.  End-stage renal disease.  2.  Hypertension.  3.  Diabetes.   POSTOPERATIVE DIAGNOSES: 1.  End-stage renal disease.  2.  Hypertension.  3.  Diabetes.   PROCEDURES: 1.  Ultrasound guidance for vascular access to right internal jugular vein.  2.  Fluoroscopic guidance for placement of catheter.  3. Placement of a 19 cm tip-to-cuff tunneled hemodialysis catheter via the right internal jugular vein.   SURGEON:  Leotis Pain, MD.    ANESTHESIA: Local with sedation.   BLOOD LOSS: 25 mL.   INDICATION FOR PROCEDURE: The patient is a 69 year old female who has now progressed to end-stage renal disease. An attempt to use her AV fistula was tried this weekend, but the fistula infiltrated. She now needs a PermCath for initiation of dialysis. Risks and benefits were discussed. Informed consent was obtained.   DESCRIPTION OF THE PROCEDURE: The patient was brought to the vascular and interventional radiology suite. The patient's right neck and chest were sterilely prepped and draped and a sterile surgical field was created. The right internal jugular vein was visualized with ultrasound and found to be patent. It was then accessed under direct ultrasound guidance and a permanent image was recorded. A wire was placed. After a skin nick and dilatation, the peel-away sheath was placed over the wire.   I then turned my attention to an area under the clavicle. Approximately 2 fingerbreadths below the clavicle a small counter incision was created and we tunneled from the subclavicular incision to the access site. Using fluoroscopic guidance, a 19 cm tip-to-cuff tunneled hemodialysis catheter was selected, tunneled from the subclavicular incision to the access site. It was then placed through the peel-away sheath and the peel-away sheath was removed. The catheter tips were  parked in the right atrium. The appropriate distal connectors were placed. It withdrew blood well and flushed easily with heparinized saline and a concentrated heparin solution was then placed. It was secured to the chest wall with 2 Prolene sutures. The access incision was closed with a single 4-0 Monocryl. A 4-0 Monocryl pursestring suture was placed around the exit site. Sterile dressings were placed.   The patient tolerated the procedure well and was taken to the recovery room in stable condition.    ____________________________ Algernon Huxley, MD jsd:bu D: 12/07/2014 15:43:59 ET T: 12/07/2014 16:13:43 ET JOB#: [id number]  cc: Algernon Huxley, MD, <Dictator> Algernon Huxley MD ELECTRONICALLY SIGNED 12/10/2014 10:41

## 2015-03-21 NOTE — Op Note (Signed)
PATIENT NAME:  Phyllis [name], Phyllis [name] MR#:  [id number] DATE OF BIRTH:  03-27-1946  DATE OF PROCEDURE:  12/17/2014  PREOPERATIVE DIAGNOSES: 1. End-stage renal disease.  2. 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   POSTOPERATIVE DIAGNOSES:  1. End-stage renal disease.  2. 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   PROCEDURES: 1. Ultrasound guidance for vascular access to the cephalic vein just below the antecubital fossa for retrograde access to radiocephalic AV fistula.  2. Left upper extremity fistulogram and central venogram.  3. Percutaneous transluminal angioplasty of perianastomotic stenosis with 5 mm diameter drug-coated, and 6 mm and 7 mm diameter high-pressure angioplasty balloon.   SURGEON: Algernon Huxley, MD   ANESTHESIA: Local with moderate conscious sedation.   ESTIMATED BLOOD LOSS: Minimal.   FLUOROSCOPY TIME: Two minutes.   INDICATION FOR PROCEDURE: This is an individual with end-stage renal disease. She had a left radiocephalic AV fistula placed about a year ago. This has not matured to be usable for dialysis, so recent attempt resulted in significant infiltration and hematoma. She is brought in for fistulogram for further evaluation and potential treatment. Risks and benefits were discussed. Informed consent was obtained.   DESCRIPTION OF PROCEDURE: The patient was brought to the vascular suite. The left upper extremity is sterilely prepped and draped and a sterile surgical field was created. Just below the antecubital fossa, accessed the cephalic vein in a retrograde fashion without difficulty with a micropuncture needle. A micropuncture wire and sheath were then placed. Upsized to a 6 [place] sheath and placed a Kumpe catheter to arterial anastomosis. There was a perianastomotic stenosis from intimal hyperplasia less than 1 cm from the anastomosis that tracked over about a 2 cm span. There was  some mild narrowing where the sheath was placed, likely from spasm. There was then two-vessel outflow in the upper arm from basilic and the cephalic vein, and the central venous circulation was patent. The patient was given 3000 units of intravenous heparin. Magic Torque wire was replaced. I treated the perianastomotic stenosis initially with a 5 mm diameter Lutonix drug-coated angioplasty balloon and then a 6 mm diameter conventional angioplasty balloon. There was still some residual narrowing of a centimeter or 2 beyond the anastomosis and upsized to a 7 mm diameter angioplasty balloon with a good angiographic completion result and only about a 20% to 25% residual stenosis. At this point, I terminated the procedure. The sheath was removed, 4-0 Monocryl pursestring suture placed. Pressure was held. Sterile dressings were placed. The patient tolerated the procedure well and was taken to the recovery room in stable condition.     ____________________________ Algernon Huxley, MD jsd:mw D: 12/17/2014 10:18:29 ET T: 12/17/2014 15:55:16 ET JOB#: [id number]  cc: Algernon Huxley, MD, <Dictator> Algernon Huxley MD ELECTRONICALLY SIGNED 12/23/2014 11:54

## 2015-03-21 NOTE — Op Note (Signed)
PATIENT NAME:  Phyllis [name], Phyllis [name] MR#:  [id number] DATE OF BIRTH:  03/13/1946  DATE OF PROCEDURE:  02/18/2015  PREOPERATIVE DIAGNOSES:  1. End-stage renal disease.  2. Poorly functioning dialysis access with poor flows on dialysis and the report of the dialysis center pulling clots.  3. Hyperlipidemia.  4. Chronic obstructive pulmonary disease.  5. Hypertension.  6. Diabetes mellitus.   POSTOPERATIVE DIAGNOSES:  1. End-stage renal disease.  2. Poorly functioning dialysis access with poor flows on dialysis and the report of the dialysis center pulling clots.  3. Hyperlipidemia.  4. Chronic obstructive pulmonary disease.  5. Hypertension.  6. Diabetes mellitus.   PROCEDURES:  1. Ultrasound guidance for vascular access to left radiocephalic AV fistula in a retrograde fashion just below the antecubital fossa.  2. Percutaneous transluminal angioplasty with drug-coated angioplasty balloon to the anastomosis of the radiocephalic arteriovenous fistula with a 5-mm diameter Lutonix drug-coated angioplasty balloon.  3. Coil embolization of cephalic vein branches in the forearm to diminish competitive flow with two Nester coils, one 6 mm, one 8 mm.  SURGEON: Algernon Huxley, MD.   ANESTHESIA: Local with moderate conscious sedation.   BLOOD LOSS: Minimal.   INDICATION FOR PROCEDURE: A 69 year old female who was sent [place] for from her dialysis center. They had reported pulling clots. Her flow rates were low, and her fistula was not running as well as it should. It had been in for a long enough time to have matured normally. She still had a catheter in place to try to get her a functional fistula. Intervention is recommended. Risks and benefits were discussed. Informed consent was obtained.   DESCRIPTION OF PROCEDURE: The patient was brought to the vascular suite. The left upper extremity was sterilely prepped and draped and a sterile surgical field was created. The fistula was accessed just below the  antecubital fossa in a retrograde fashion in the cephalic vein under direct ultrasound guidance, and a permanent image was recorded. A micropuncture wire and sheath were then placed, and we upsized to a 6-French sheath and gave the patient 3000 units of intravenous heparin. A Kumpe catheter was placed into the radial artery and imaging was performed. This showed a moderate stenosis from intimal hyperplasia just beyond the anastomosis in the 60% to 65% range. About 5 to 7 cm beyond the anastomosis, there was a large cephalic vein branch with 2 primary branches causing competitive flow. The remainder of the fistula appeared widely patent, and there was dual outflow in the upper arm of the cephalic and the basilic vein. The central venous circulation was patent. I initially re-parked a wire in the radial artery and treated the peri-anastomotic stenosis with a 5-mm diameter Lutonix drug-coated angioplasty balloon with the distal tip just into the radial artery and the balloon encompassing the stenosis at the anastomosis and into the cephalic vein.  Completion angiogram following this showed improvement, although there was significant spasm in the vein. The residual stenosis appeared to be about 30%. I then cannulated the large cephalic vein branch. I went out both of the primary branches as this branch bifurcated quickly after about 3 to 4 cm. In the smaller of the 2 branches, I placed a 6-mm coil. In the larger of the 2 branches and back into the main cephalic vein branch, I used an 8 Nestor coil. This resulted in successful exclusion of flow in these branches with all the flow now going through the fistula. At this point, I elected to terminate the procedure.  The sheath was removed. A 4-0 Monocryl pursestring suture was placed. Pressure was held and sterile dressings were placed. The patient tolerated the procedure well and was taken to the recovery room in stable condition.    ____________________________ Algernon Huxley, MD jsd:jh D: 02/18/2015 17:17:50 ET T: 02/19/2015 08:42:06 ET JOB#: [id number]  cc: Algernon Huxley, MD, <Dictator> Algernon Huxley MD ELECTRONICALLY SIGNED 02/22/2015 15:52

## 2015-03-21 NOTE — Consult Note (Signed)
CHIEF COMPLAINT and HISTORY:  Subjective/Chief Complaint SOB, ESRD   History of Present Illness Patient admitted over the weekend and found to now have ESRD.  Had known CKD for several years.  Was admitted with lethargy and SOB and found to now have a GFR of 4.  She has no palpitations or arrhythmias.  She was quite anemic and has responded to transfusion.  Her lethargy and SOB had progressed over many months.  It worsened to the point she needed to be admitted.  She has an AVF which was placed a year or so ago. This was attempted for dialysis and unable to be used. Needs a permcath and needs procedure to get fistula useable.   PAST MEDICAL/SURGICAL HISTORY:  Past Medical History:   Gout:    Migraines:    Sleep Apnea:    COPD:    Asthma:    Renal Insufficiency:    Osteoarthritis:    Hypertension:    NIDDM:    GERD - Esophageal Reflux:    Cholecystectomy:   ALLERGIES:  Allergies:  Codeine: N/V  Quinine: N/V, Headaches  Macrodantin: Swelling  Bactrim: Swelling  Vasotec: Headaches  Lidoderm: Hives  Zocor: Other  Baclofen: Other  Neosporin: Other  Ultram: Other  HOME MEDICATIONS:  Home Medications: Medication Instructions Status  allopurinol 100 mg oral tablet 1 tab(s) orally 2 times a day  Active  docusate sodium 100 mg oral capsule 1 cap(s) orally 3 times a day Active  Vitamin D2 50,000 intl units (1.25 mg) oral capsule 1 cap(s) orally once a week Active  cetirizine 10 mg oral tablet 1 tab(s) orally once a day Active  amlodipine 10 mg oral tablet 1 tab(s) orally once a day Active  topiramate 25 mg oral tablet 1 tab(s) orally continuously, As Needed Active  VESIcare 5 mg oral tablet 1 tab(s) orally once a day Active  omeprazole 20 mg oral delayed release capsule 1 cap(s) orally 2 times a day Active  cyclobenzaprine 10 mg oral tablet 1 tab(s) orally 4 times a day, As Needed for muscle spasms.  Active  carvedilol 12.5 mg oral tablet 1 tab(s) orally 2 times a day  Active  acetaminophen-HYDROcodone 325 mg-7.5 mg oral tablet 1 tab(s) orally 4 times a day Active  Combivent 2 puff(s) inhaled 2 times a day, As Needed - for Shortness of Breath Active  pravastatin 40 mg oral tablet 1 tab(s) orally once a day (at bedtime) Active   Family and Social History:  Family History Coronary Artery Disease  Hypertension   Social History negative tobacco, negative ETOH, negative Illicit drugs   Place of Living Home   Review of Systems:  Subjective/Chief Complaint No TIA/stroke/seizure No heat or cold intolerance No dysuria/hematuria No blurry or double vision No tinnitus or ear pain No rashes or ulcer No suicidal ideation or psychosis No signs of bleeding or easy bruising.  Anemia from CKD Positive for SOB No palpitations or chest pain No N/V/D or abdominal pain No joint pain or joint swelling No fever or chills No unintentional weight loss.  Positive for weight gain   Medications/Allergies Reviewed Medications/Allergies reviewed   Physical Exam:  GEN well developed, well nourished, no acute distress   HEENT pink conjunctivae, moist oral mucosa   NECK No masses  trachea midline   RESP normal resp effort  no use of accessory muscles   CARD regular rate  LE edema present  no JVD   VASCULAR ACCESS AV fistula present  Good bruit  Good  thrill  left arm   ABD denies tenderness  soft  normal BS   GU clear yellow urine draining  no superpubic tenderness   LYMPH negative neck, negative axillae   EXTR negative cyanosis/clubbing, positive edema   SKIN normal to palpation, skin turgor good   NEURO cranial nerves intact, follows commands, motor/sensory function intact   PSYCH alert, A+O to time, place, person   LABS:  Laboratory Results: Thyroid:    16-Jan-16 04:56, Thyroid Stimulating Hormone  Thyroid Stimulating Hormone 0.762  0.45-4.50  (IU = International Unit)   -----------------------  Pregnant patients have   different reference    ranges for TSH:   - - - - - - - - - -   Pregnant, first trimetser:   0.36 - 2.50 uIU/mL  Hepatic:    18-Jan-16 06:09, Comprehensive Metabolic Panel  Bilirubin, Total 0.3  Alkaline Phosphatase 58  46-116  NOTE: New Reference Range  06/09/14  SGPT (ALT) 18  14-63  NOTE: New Reference Range  06/09/14  SGOT (AST) 18  Total Protein, Serum 6.0  Albumin, Serum 2.5  Routine BB:    16-Jan-16 09:16, Crossmatch 2 Units  Crossmatch Unit 1   Transfused  Crossmatch Unit 2 Ready  Result(s) reported on 06 Dec 2014 at 07:34AM.    16-Jan-16 09:16, Type and Antibody Screen  ABO Group + Rh Type   O Positive  Antibody Screen NEGATIVE  Result(s) reported on 05 Dec 2014 at 10:14AM.  Lab:    15-Jan-16 06:10, ABG  pH (ABG) 7.300  7.350-7.450  NOTE: New Reference Range  06/13/14  PCO2 31  32-48  NOTE: New Reference Range  06/30/14  PO2 133  83-108  NOTE: New Reference Range  06/13/14  FiO2 40  Base Excess -10  -3-3  NOTE: New Reference Range  06/30/14  HCO3 15.3  21.0-28.0  NOTE: New Reference Range  06/13/14  O2 Saturation 97.1  O2 Device BIPAP  Specimen Site (ABG)   RT RADIAL  Specimen Type (ABG) ARTERIAL  Patient Temp (ABG) 37.0  PSV 12  PEEP 5.0  Mechanical Rate 12  Result(s) reported on 04 Dec 2014 at 06:18AM.  Cardiology:    15-Jan-16 05:49, ED ECG  Ventricular Rate 106  Atrial Rate 106  P-R Interval 138  QRS Duration 82  QT 504  QTc 669  P Axis 32  T Axis 22  ECG interpretation   Sinus tachycardia with frequent Premature ventricular complexes  Possible Left atrial enlargement  Borderline ECG  When compared with ECG of 19-Aug-2012 12:31,  Premature ventricular complexes are now Present  Vent. rate has increased BY  50 BPM  Nonspecific T wave abnormality, improved in Anterior leads  QT has lengthened  ----------unconfirmed----------  Confirmed by OVERREAD, NOT ([id number]), editor PEARSON, BARBARA ([id number]) on 12/07/2014 9:07:20 AM  ED ECG   Routine Chem:    15-Jan-16  62:56, Basic Metabolic Panel (w/Total Calcium)  Glucose, Serum 125  BUN 90  Creatinine (comp) 9.39  Sodium, Serum 140  Potassium, Serum 4.7  Chloride, Serum 111  CO2, Serum 16  Calcium (Total), Serum 8.2  Anion Gap 13  Osmolality (calc) 308  eGFR (African American) 5  eGFR (Non-African American) 4  eGFR values <19m/min/1.73 m2 may be an indication of chronic  kidney disease (CKD).  Calculated eGFR, using the MRDR Study equation, is useful in   patients with stable renal function.  The eGFR calculation will not be reliable in acutely ill patients  when serum creatinine is  changing rapidly. It is not useful in  patients on dialysis. The eGFR calculation may not be applicable  to patients at the low and high extremes of body sizes, pregnant  women, and vegetarians.    15-Jan-16 05:54, B-Type Natriuretic Peptide [facility])  B-Type Natriuretic Peptide [facility]) [phone]  Result(s) reported on 04 Dec 2014 at 06:18AM.    15-Jan-16 05:54, Hemoglobin A1c ([facility])  Hemoglobin A1c [facility]) 4.9  The American Diabetes Association recommends that a primary goal of  therapy should be <7% and that physicians should reevaluate the  treatment regimen in patients with HbA1c values consistently >8%.    15-Jan-16 13:40, Troponin I  Result Comment   TROPONIN - RESULTS VERIFIED BY REPEAT TESTING.   - C/BRITTANY RUDD AT 1425 12/04/14-DAS   - READ-BACK PROCESS PERFORMED.   Result(s) reported on 04 Dec 2014 at 02:29PM.    16-Jan-16 78:58, Basic Metabolic Panel (w/Total Calcium)  Glucose, Serum 83  BUN 98  Creatinine (comp) 9.37  Sodium, Serum 143  Potassium, Serum 4.7  Chloride, Serum 112  CO2, Serum 17  Calcium (Total), Serum 7.8  Anion Gap 14  Osmolality (calc) 315  eGFR (African American) 5  eGFR (Non-African American) 4  eGFR values <87m/min/1.73 m2 may be an indication of chronic  kidney disease (CKD).  Calculated eGFR, using the MRDR Study equation, is useful in   patients with stable renal  function.  The eGFR calculation will not be reliable in acutely ill patients  when serum creatinine is changing rapidly. It is not useful in  patients on dialysis. The eGFR calculation may not be applicable  to patients at the low and high extremes of body sizes, pregnant  women, and vegetarians.    17-Jan-16 085:02 Basic Metabolic Panel (w/Total Calcium)  Glucose, Serum 103  BUN 103  Creatinine (comp) 9.63  Sodium, Serum 139  Potassium, Serum 4.4  Chloride, Serum 109  CO2, Serum 18  Calcium (Total), Serum 8.2  Anion Gap 12  Osmolality (calc) 310  eGFR (African American) 5  eGFR (Non-African American) 4  eGFR values <679mmin/1.73 m2 may be an indication of chronic  kidney disease (CKD).  Calculated eGFR, using the MRDR Study equation, is useful in   patients with stable renal function.  The eGFR calculation will not be reliable in acutely ill patients  when serum creatinine is changing rapidly. It is not useful in  patients on dialysis. The eGFR calculation may not be applicable  to patients at the low and high extremes of body sizes, pregnant  women, and vegetarians.    18-Jan-16 06:09, Comprehensive Metabolic Panel  Glucose, Serum 103  BUN 109  Creatinine (comp) 9.64  Sodium, Serum 140  Potassium, Serum 4.2  Chloride, Serum 108  CO2, Serum 19  Calcium (Total), Serum 8.3  Osmolality (calc) 314  eGFR (African American) 5  eGFR (Non-African American) 4  eGFR values <6037min/1.73 m2 may be an indication of chronic  kidney disease (CKD).  Calculated eGFR, using the MRDR Study equation, is useful in   patients with stable renal function.  The eGFR calculation will not be reliable in acutely ill patients  when serum creatinine is changing rapidly. It is not useful in  patients on dialysis. The eGFR calculation may not be applicable  to patients at the low and high extremes of body sizes, pregnant  women, and vegetarians.  Anion Gap 13  Cardiac:    15-Jan-16 05:54,  Cardiac Panel  CK, Total 54  CPK-MB, Serum 2.1  Result(s) reported on 04 Dec 2014 at 08:44AM.    15-Jan-16 05:54, Troponin I  Troponin I 0.04  0.00-0.05  0.05 ng/mL or less: NEGATIVE   Repeat testing in 3-6 hrs   if clinically indicated.  >0.05 ng/mL: POTENTIAL   MYOCARDIAL INJURY. Repeat   testing in 3-6 hrs if   clinically indicated.  NOTE: An increase or decrease   of 30% or more on serial   testing suggests a   clinically important change    15-Jan-16 09:56, Cardiac Panel  CK, Total 49  CPK-MB, Serum 2.0  Result(s) reported on 04 Dec 2014 at 11:36AM.    15-Jan-16 09:56, Troponin I  Troponin I 0.05  0.00-0.05  0.05 ng/mL or less: NEGATIVE   Repeat testing in 3-6 hrs   if clinically indicated.  >0.05 ng/mL: POTENTIAL   MYOCARDIAL INJURY. Repeat   testing in 3-6 hrs if   clinically indicated.  NOTE: An increase or decrease   of 30% or more on serial   testing suggests a   clinically important change    15-Jan-16 13:40, Cardiac Panel  CK, Total 45  CPK-MB, Serum 1.8  Result(s) reported on 04 Dec 2014 at 02:20PM.    15-Jan-16 13:40, Troponin I  Troponin I 0.06  0.00-0.05  0.05 ng/mL or less: NEGATIVE   Repeat testing in 3-6 hrs   if clinically indicated.  >0.05 ng/mL: POTENTIAL   MYOCARDIAL INJURY. Repeat   testing in 3-6 hrs if   clinically indicated.  NOTE: An increase or decrease   of 30% or more on serial   testing suggests a   clinically important change  Routine Coag:    15-Jan-16 05:54, Prothrombin Time  Prothrombin 14.4  INR 1.1  INR reference interval applies to patients on anticoagulant therapy.  A single INR therapeutic range for coumarins is not optimal for all  indications; however, the suggested range for most indications is  2.0 - 3.0.  Exceptions to the INR Reference Range may include: Prosthetic heart  valves, acute myocardial infarction, prevention of myocardial  infarction, and combinations of aspirin and anticoagulant. The need  for  a higher or lower target INR must be assessed individually.  Reference: The Pharmacology and Management of the Vitamin K   antagonists: the seventh ACCP Conference on Antithrombotic and  Thrombolytic Therapy. XHBZJ.6967 Sept:126 (3suppl): [id number].  A HCT value >55% may artifactually increase the PT.  In one study,   the increase was an average of 25%.  Reference:  "Effect on Routine and Special Coagulation Testing Values  of Citrate Anticoagulant Adjustment in Patients with High HCT Values."  American Journal of Clinical Pathology 2006;126:400-405.  Routine Hem:    15-Jan-16 05:54, Hemogram, Platelet Count  WBC (CBC) 13.4  RBC (CBC) 2.82  Hemoglobin (CBC) 8.2  Hematocrit (CBC) 25.9  Platelet Count (CBC) 141  Result(s) reported on 04 Dec 2014 at 06:06AM.  MCV 92  MCH 29.1  MCHC 31.7  RDW 15.0    16-Jan-16 04:56, CBC Profile  WBC (CBC) 7.5  RBC (CBC) 2.28  Hemoglobin (CBC) 6.8  Hematocrit (CBC) 20.7  Platelet Count (CBC) 103  MCV 91  MCH 29.6  MCHC 32.7  RDW 15.1  Neutrophil % 77.6  Lymphocyte % 11.7  Monocyte % 8.9  Eosinophil % 1.5  Basophil % 0.3  Neutrophil # 5.8  Lymphocyte # 0.9  Monocyte # 0.7  Eosinophil # 0.1  Basophil # 0.0  Result(s) reported on 05 Dec 2014 at 05:38AM.  17-Jan-16 07:44, CBC Profile  WBC (CBC) 7.1  RBC (CBC) 2.80  Hemoglobin (CBC) 8.2  Hematocrit (CBC) 25.0  Platelet Count (CBC) 114  MCV 89  MCH 29.2  MCHC 32.8  RDW 14.6  Neutrophil % 76.4  Lymphocyte % 12.7  Monocyte % 7.4  Eosinophil % 3.2  Basophil % 0.3  Neutrophil # 5.4  Lymphocyte # 0.9  Monocyte # 0.5  Eosinophil # 0.2  Basophil # 0.0  Result(s) reported on 06 Dec 2014 at 07:55AM.    18-Jan-16 06:09, CBC Profile  WBC (CBC) 6.7  RBC (CBC) 2.90  Hemoglobin (CBC) 8.5  Hematocrit (CBC) 26.1  Platelet Count (CBC) 126  MCV 90  MCH 29.2  MCHC 32.4  RDW 14.4  Neutrophil % 70.7  Lymphocyte % 15.7  Monocyte % 8.0  Eosinophil % 5.0  Basophil % 0.6  Neutrophil # 4.7   Lymphocyte # 1.0  Monocyte # 0.5  Eosinophil # 0.3  Basophil # 0.0  Result(s) reported on 07 Dec 2014 at 06:32AM.   RADIOLOGY:  Radiology Results: XRay:    15-Jan-16 06:14, Chest Portable Single View  Chest Portable Single View  REASON FOR EXAM:    resp distress  COMMENTS:       PROCEDURE: DXR - DXR PORTABLE CHEST SINGLE VIEW  - Dec 04 2014  6:14AM     CLINICAL DATA:  Acute onset of respiratory distress and decreased O2  saturation. Tachypnea and tachycardia. Initial encounter.    EXAM:  PORTABLE CHEST - 1 VIEW    COMPARISON:  CT of the chest performed 09/02/2008    FINDINGS:  The lungs are well-aerated. Patchy right-sided airspace  opacification raises concern for pneumonia. There is no evidence of  pleural effusion or pneumothorax.    The cardiomediastinal silhouette is within normal limits. No acute  osseous abnormalities are seen.     IMPRESSION:  Patchy right-sided airspace opacification raises concern for  pneumonia. The appearance is less typical for interstitial edema.      Electronically Signed    By: Garald Balding M.D.    On: 12/04/2014 06:24     Verified By: JEFFREY . CHANG, M.D.,    18-Jan-16 09:42, Chest 1 View AP or PA  Chest 1 View AP or PA  REASON FOR EXAM:    Pneumonia follow up  COMMENTS:       PROCEDURE: DXR - DXR CHEST 1 VIEWAP OR PA  - Dec 07 2014  9:42AM     CLINICAL DATA:  Pneumonia follow-up    EXAM:  CHEST - 1 VIEW    COMPARISON:  12/04/2014    FINDINGS:  Cardiomediastinal silhouette is stable. There is improvement in  aeration. Residual small atelectasis or infiltrate right base. No  pulmonary edema. Atherosclerotic calcifications of thoracic aorta  again noted. No new infiltrate or pulmonary edema.     IMPRESSION:  Residual small atelectasis or infiltrate right base with improvement  from prior exam. No pulmonary edema.      Electronically Signed    By: Lahoma Crocker M.D.    On: 12/07/2014 10:24         Verified By:  Ephraim Hamburger, M.D.,  [place]:    11-Sep-15 10:35, Screening Digital Mammogram  PACS Image    15-Jan-16 06:14, Chest Portable Single View  PACS Image    18-Jan-16 09:42, Chest 1 View AP or PA  PACS Image  [facility]:    11-Sep-15 10:35, Screening Digital Mammogram  Screening Digital Mammogram  REASON FOR EXAM:    SCR MAMMO NO ORDER  COMMENTS:       PROCEDURE: MAM - MAM DGTL SCRN MAM NO ORDER W/CAD  - Jul 31 2014 10:35AM     CLINICAL DATA:  Screening.    EXAM:  DIGITAL SCREENING BILATERAL MAMMOGRAM WITH CAD    COMPARISON:  Previous exam(s).    ACR Breast Density Category b: There are scattered areas of  fibroglandular density.  FINDINGS:  There are no findings suspicious for malignancy. Images were  processed with CAD.     IMPRESSION:  No mammographic evidence of malignancy. A result letter of this  screening mammogram will be mailed directly to the patient.    RECOMMENDATION:  Screening mammogram in one year. (Code:[id number]-B-01Y)    BI-RADS CATEGORY  1: Negative.      Electronically Signed    By: Enrique Sack M.D.    On: 07/31/2014 15:01        Verified By: Gerald Stabs, M.D.,   ASSESSMENT AND PLAN:  Assessment/Admission Diagnosis progression of renal disease and now ESRD AVF not matured for use despite being a year old.  Infitrated with dialysis attempt Saturday Other medical issues as above   Plan ESRD, Permcath placed today Complication of dialysis access.  AVF needs a fistulagram and this can be scheduled for later this week after she starts HD with a Permcath.  May need surgical revision but would do fistulagram first. Other medical issues being managed by primary service.   level 4 consult   Electronic Signatures: Algernon Huxley (MD)  (Signed 18-Jan-16 15:41)  Authored: Chief Complaint and History, PAST MEDICAL/SURGICAL HISTORY, ALLERGIES, HOME MEDICATIONS, Family and Social History, Review of Systems, Physical Exam, LABS, RADIOLOGY, Assessment and  Plan   Last Updated: 18-Jan-16 15:41 by Algernon Huxley (MD)

## 2015-03-21 NOTE — Discharge Summary (Signed)
PATIENT NAME:  Phyllis, [name] MR#:  [id number] DATE OF BIRTH:  1946/02/22  DATE OF ADMISSION:  12/04/2014 DATE OF DISCHARGE:    DIAGNOSES AT TIME OF DISCHARGE:  1. Acute respiratory failure secondary to pneumonia and asthma.  2. End-stage renal disease requiring dialysis.  3. Type 2 diabetes.  4. Peripheral neuropathy.  5. Generalized weakness.   CHIEF COMPLAINT: Shortness of breath.   HISTORY OF PRESENT ILLNESS: Phyllis [name] is a 69 year old female who presents to the ED complaining of shortness of breath like she was having an asthma attack. The patient had been using inhalers at home, but reportedly symptoms continued to get worse and she subsequently called EMS, was initially placed on CPAP, but O2 saturations were noted to be around 50 and after receiving multiple DuoNebs through BiPAP circuit her breathing improved. The patient also has been experiencing fluid retention and gained approximately 25 pounds in weight.   PAST MEDICAL HISTORY: Significant for chronic kidney disease, diabetic neuropathy, type 2 diabetes, and asthma. See H and P for full details.   HOSPITAL COURSE: The patient was admitted initially to ICU. She was seen in consultation by nephrologist Dr. Holley Raring.  Her GFR as noted before. She was therefore advised dialysis.  The patient has already had an AV fistula in the left forearm, but this did not work very well and she therefore needed PermCath placement in the jugular artery and the jugulars and subsequently dialysis was started. The patient underwent 3 sessions of dialysis and was also started on IV Levaquin and subsequently switched to p.o. Levaquin. During her stay in the hospital she continued to make good progress.   LABORATORY DATA: Serum creatinine 9.64 on admission, glucose 103, potassium 4.2, CO2 19, total protein 6, albumin 2.5. Serum creatinine improved to 3.3 following dialysis. Initial chest x-ray showed patchy right-sided air space opacification  raising concern for pneumonia and a repeat x-ray showed residual small atelectasis or infiltrate in the right base with improvement from previous exam. The patient clinically improved and it was felt that she would benefit from rehabilitation. She was therefore transferred to rehabilitation facility at [facility] and will undergo dialysis there as well.   MEDICATIONS ON DISCHARGE: Levaquin 250 mg every 48 hours for 10 days, calcium acetate 667 mg t.i.d. with meals, albuterol ipratropium nebulizer q.i.d. p.r.n., Combivent inhaler 2 puffs 4 times a day as needed, hydrocodone/acetaminophen 7.5/325 mg 1 tablet 4 times a day as needed, carvedilol 12.5 mg b.i.d., cyclobenzaprine 10 mg q.i.d. as needed, omeprazole 20 mg b.i.d., Vesicare 5 mg once a day, Topamax 25 mg as needed, amlodipine 10 mg once a day, cetirizine 10 mg once a day, vitamin D2, 50,000 units once a week, docusate sodium 100 mg t.i.d., pravastatin 40 mg once a day, allopurinol 100 mg once a day, and insulin aspart subcutaneous solution at bedtime.   DISCHARGE INSTRUCTIONS:  The patient was advised a renal diet and to follow up with Dr. Holley Raring, nephrologist and also follow up with Dr. Ginette Pitman in 1-2 weeks time. The patient is stable at the time of discharge.   Total time spent in discharge of this pt; 35 minutes ____________________________ [name], MD vh:bu D: 12/10/2014 13:18:41 ET T: 12/10/2014 13:42:08 ET JOB#: [id number]  cc: [name], MD, <Dictator> [name] MD ELECTRONICALLY SIGNED 12/10/2014 17:45

## 2015-04-12 ENCOUNTER — Ambulatory Visit
Admission: RE | Admit: 2015-04-12 | Discharge: 2015-04-12 | Disposition: A | Discharge status: Home / Self Care | Payer: Medicare Other | Source: Ambulatory Visit | Attending: Vascular Surgery | Admitting: Vascular Surgery

## 2015-04-12 ENCOUNTER — Encounter
Admission: RE | Disposition: A | Discharge status: Home / Self Care | Payer: PRIVATE HEALTH INSURANCE | Source: Ambulatory Visit | Attending: Vascular Surgery

## 2015-04-12 ENCOUNTER — Encounter: Payer: Self-pay | Admitting: *Deleted

## 2015-04-12 DIAGNOSIS — E780 Pure hypercholesterolemia: Secondary | ICD-10-CM | POA: Insufficient documentation

## 2015-04-12 DIAGNOSIS — M109 Gout, unspecified: Secondary | ICD-10-CM | POA: Diagnosis not present

## 2015-04-12 DIAGNOSIS — Z79899 Other long term (current) drug therapy: Secondary | ICD-10-CM | POA: Insufficient documentation

## 2015-04-12 DIAGNOSIS — I120 Hypertensive chronic kidney disease with stage 5 chronic kidney disease or end stage renal disease: Secondary | ICD-10-CM | POA: Insufficient documentation

## 2015-04-12 DIAGNOSIS — G4730 Sleep apnea, unspecified: Secondary | ICD-10-CM | POA: Diagnosis not present

## 2015-04-12 DIAGNOSIS — T829XXA Unspecified complication of cardiac and vascular prosthetic device, implant and graft, initial encounter: Secondary | ICD-10-CM

## 2015-04-12 DIAGNOSIS — T82858A Stenosis of vascular prosthetic devices, implants and grafts, initial encounter: Secondary | ICD-10-CM | POA: Insufficient documentation

## 2015-04-12 DIAGNOSIS — K219 Gastro-esophageal reflux disease without esophagitis: Secondary | ICD-10-CM | POA: Diagnosis not present

## 2015-04-12 DIAGNOSIS — I868 Varicose veins of other specified sites: Secondary | ICD-10-CM | POA: Insufficient documentation

## 2015-04-12 DIAGNOSIS — Y832 Surgical operation with anastomosis, bypass or graft as the cause of abnormal reaction of the patient, or of later complication, without mention of misadventure at the time of the procedure: Secondary | ICD-10-CM | POA: Insufficient documentation

## 2015-04-12 DIAGNOSIS — G43909 Migraine, unspecified, not intractable, without status migrainosus: Secondary | ICD-10-CM | POA: Insufficient documentation

## 2015-04-12 DIAGNOSIS — J449 Chronic obstructive pulmonary disease, unspecified: Secondary | ICD-10-CM | POA: Insufficient documentation

## 2015-04-12 DIAGNOSIS — N186 End stage renal disease: Secondary | ICD-10-CM | POA: Diagnosis not present

## 2015-04-12 DIAGNOSIS — E119 Type 2 diabetes mellitus without complications: Secondary | ICD-10-CM | POA: Insufficient documentation

## 2015-04-12 DIAGNOSIS — J45909 Unspecified asthma, uncomplicated: Secondary | ICD-10-CM | POA: Diagnosis not present

## 2015-04-12 HISTORY — DX: Sleep apnea, unspecified: G47.30

## 2015-04-12 HISTORY — PX: PERIPHERAL VASCULAR CATHETERIZATION: SHX172C

## 2015-04-12 HISTORY — DX: Gastro-esophageal reflux disease without esophagitis: K21.9

## 2015-04-12 HISTORY — DX: Other specified disorders of the skin and subcutaneous tissue: L98.8

## 2015-04-12 HISTORY — DX: Gout, unspecified: M10.9

## 2015-04-12 HISTORY — DX: Presence of other vascular implants and grafts: Z95.828

## 2015-04-12 LAB — POTASSIUM (ARMC VASCULAR LAB ONLY): Potassium (ARMC vascular lab): 4.4

## 2015-04-12 LAB — GLUCOSE, CAPILLARY: Glucose-Capillary: 103 mg/dL — ABNORMAL HIGH (ref 65–99)

## 2015-04-12 SURGERY — A/V SHUNTOGRAM/FISTULAGRAM
Anesthesia: Moderate Sedation

## 2015-04-12 MED ORDER — CEFAZOLIN SODIUM 1-5 GM-% IV SOLN
1.0000 g | Freq: Once | INTRAVENOUS | Status: AC
  Administered 2015-04-12: 1 g via INTRAVENOUS

## 2015-04-12 MED ORDER — HEPARIN SODIUM (PORCINE) 1000 UNIT/ML IJ SOLN
INTRAMUSCULAR | Status: AC
  Filled 2015-04-12: qty 1

## 2015-04-12 MED ORDER — IOHEXOL 300 MG/ML  SOLN
INTRAMUSCULAR | Status: DC | PRN
  Administered 2015-04-12: 30 mL via INTRA_ARTERIAL

## 2015-04-12 MED ORDER — MIDAZOLAM HCL 2 MG/2ML IJ SOLN
INTRAMUSCULAR | Status: DC | PRN
  Administered 2015-04-12: 2 mg via INTRAVENOUS

## 2015-04-12 MED ORDER — FENTANYL CITRATE (PF) 100 MCG/2ML IJ SOLN
INTRAMUSCULAR | Status: DC | PRN
  Administered 2015-04-12: 50 ug via INTRAVENOUS

## 2015-04-12 MED ORDER — ATROPINE SULFATE 0.4 MG/ML IJ SOLN
0.5000 mg | INTRAMUSCULAR | Status: DC | PRN
  Filled 2015-04-12: qty 1.25

## 2015-04-12 MED ORDER — MIDAZOLAM HCL 5 MG/5ML IJ SOLN
INTRAMUSCULAR | Status: AC
  Filled 2015-04-12: qty 5

## 2015-04-12 MED ORDER — FENTANYL CITRATE (PF) 100 MCG/2ML IJ SOLN
INTRAMUSCULAR | Status: AC
  Filled 2015-04-12: qty 2

## 2015-04-12 MED ORDER — SODIUM CHLORIDE 0.9 % IV SOLN
INTRAVENOUS | Status: DC
  Administered 2015-04-12: 10:00:00 via INTRAVENOUS

## 2015-04-12 MED ORDER — LIDOCAINE-EPINEPHRINE (PF) 1 %-1:200000 IJ SOLN
INTRAMUSCULAR | Status: AC
  Filled 2015-04-12: qty 30

## 2015-04-12 MED ORDER — HEPARIN (PORCINE) IN NACL 2-0.9 UNIT/ML-% IJ SOLN
INTRAMUSCULAR | Status: AC
  Filled 2015-04-12: qty 1000

## 2015-04-12 MED ORDER — HEPARIN SODIUM (PORCINE) 1000 UNIT/ML IJ SOLN
INTRAMUSCULAR | Status: DC | PRN
  Administered 2015-04-12: 3000 [IU] via INTRAVENOUS

## 2015-04-12 MED ORDER — CEFAZOLIN SODIUM 1-5 GM-% IV SOLN
INTRAVENOUS | Status: AC
  Filled 2015-04-12: qty 50

## 2015-04-12 MED ORDER — ONDANSETRON HCL 4 MG/2ML IJ SOLN: 4.0000 mg | INTRAMUSCULAR | Status: DC | PRN

## 2015-04-12 SURGICAL SUPPLY — 10 items
BALLN DORADO 8X60X80 (BALLOONS) ×3
BALLN LUTONIX DCB 7X60X130 (BALLOONS) ×3
CANNULA 5F STIFF (CANNULA) ×3
CATH KUMPE (CATHETERS) ×2
CATH SLIP 5FR 0.38 X 40 KMP (CATHETERS) ×1
DEVICE PRESTO INFLATION (MISCELLANEOUS) ×3
PACK ANGIOGRAPHY (CUSTOM PROCEDURE TRAY) ×3
SHEATH BRITE TIP 6FRX5.5 (SHEATH) ×3
TOWEL OR 17X26 4PK STRL BLUE (TOWEL DISPOSABLE) ×3
WIRE MAGIC TOR.035 180C (WIRE) ×3

## 2015-04-12 NOTE — H&P (Signed)
[facility] History & Physical Update  The patient was interviewed and re-examined.  The patient's previous History and Physical has been reviewed and is unchanged.  There is no change in the plan of care.  Tron Flythe, MD  04/12/2015, 9:18 AM

## 2015-04-12 NOTE — Op Note (Signed)
 VEIN AND VASCULAR SURGERY    OPERATIVE NOTE   PROCEDURE: 1.   Left radiocephalic arteriovenous fistula cannulation under ultrasound guidance in the cephalic vein in a retrograde fashion just below the antecubital fossa 2.   Left arm fistulagram including central venogram 3.   Percutaneous transluminal angioplasty of perianastomotic stenosis with 7 mm diameter drug-coated an 8 mm diameter high pressure angioplasty balloon  PRE-OPERATIVE DIAGNOSIS: 1. ESRD 2. Poorly functional left radiocephalic AVF  POST-OPERATIVE DIAGNOSIS: same as above   SURGEON: Leotis Pain, MD  ANESTHESIA: local with MCS  ESTIMATED BLOOD LOSS: Minimal  FINDING(S): 1. 70-80% stenosis in the cephalic vein just beyond the anastomosis, improved to less than 30% after angioplasty  SPECIMEN(S):  None  CONTRAST: 35 cc  INDICATIONS: Phyllis [name] is a 69 y.o. female who presents with malfunctioning  left radiocephalic arteriovenous fistula.  The patient is scheduled for  left arm fistulagram.  The patient is aware the risks include but are not limited to: bleeding, infection, thrombosis of the cannulated access, and possible anaphylactic reaction to the contrast.  The patient is aware of the risks of the procedure and elects to proceed forward.  DESCRIPTION: After full informed written consent was obtained, the patient was brought back to the angiography suite and placed supine upon the angiography table.  The patient was connected to monitoring equipment.  The  left arm was prepped and draped in the standard fashion for a percutaneous access intervention.  Under ultrasound guidance, the  left radiocephalic arteriovenous fistula was cannulated with a micropuncture needle under direct ultrasound guidance just below the antecubital fossa and a retrograde fashion and a permanent image was performed.  The microwire was advanced into the fistula and the needle was exchanged for the a microsheath.  I then upsized  to a 6 Fr Sheath and imaging was performed.  Hand injections were completed to image the access including the central venous system. A Kumpe catheter was placed at the anastomosis to evaluate the perianastomotic region. This demonstrated about a 70-75% stenosis in the cephalic vein just beyond the anastomosis. The previously embolized branch of the cephalic vein remained occluded. There was dual outflow in the upper arm and patent central venous circulation.  Based on the images, this patient will need intervention for the perianastomotic stenosis. I then gave the patient 3000 units of intravenous heparin.  I then crossed the stenosis with a Magic Tourqe wire.  Based on the imaging, a 7 mm x 6 cm  Lutonix drug-coated angioplasty balloon was selected.  The balloon was centered around the perianastomotic stenosis with the distal tip placed into the radial artery and inflated to 12 ATM for 1 minute(s).  This resulted in a suboptimal result with a greater than 50% stenosis. I then exchanged for an 8 mm diameter high pressure angioplasty balloon inflated to 12 atm for 1 minute. On completion imaging, a 25-30 % residual stenosis was present.  The fistula now had an excellent thrill to palpation   Based on the completion imaging, no further intervention is necessary.  The wire and balloon were removed from the sheath.  A 4-0 Monocryl purse-string suture was sewn around the sheath.  The sheath was removed while tying down the suture.  A sterile bandage was applied to the puncture site.  COMPLICATIONS: none  CONDITION: stable   Phyllis [name]  04/12/2015 10:40 AM

## 2015-04-13 ENCOUNTER — Encounter: Payer: Self-pay | Admitting: Vascular Surgery

## 2015-04-30 ENCOUNTER — Other Ambulatory Visit: Payer: Self-pay | Admitting: Internal Medicine

## 2015-04-30 DIAGNOSIS — Z1231 Encounter for screening mammogram for malignant neoplasm of breast: Secondary | ICD-10-CM

## 2015-06-09 ENCOUNTER — Encounter
Admission: RE | Disposition: A | Discharge status: Home / Self Care | Payer: Self-pay | Source: Ambulatory Visit | Attending: Vascular Surgery

## 2015-06-09 ENCOUNTER — Ambulatory Visit
Admission: RE | Admit: 2015-06-09 | Discharge: 2015-06-09 | Disposition: A | Discharge status: Home / Self Care | Payer: Medicare Other | Source: Ambulatory Visit | Attending: Vascular Surgery | Admitting: Vascular Surgery

## 2015-06-09 DIAGNOSIS — I120 Hypertensive chronic kidney disease with stage 5 chronic kidney disease or end stage renal disease: Secondary | ICD-10-CM | POA: Insufficient documentation

## 2015-06-09 DIAGNOSIS — J45909 Unspecified asthma, uncomplicated: Secondary | ICD-10-CM | POA: Insufficient documentation

## 2015-06-09 DIAGNOSIS — M109 Gout, unspecified: Secondary | ICD-10-CM | POA: Insufficient documentation

## 2015-06-09 DIAGNOSIS — G4730 Sleep apnea, unspecified: Secondary | ICD-10-CM | POA: Insufficient documentation

## 2015-06-09 DIAGNOSIS — E1122 Type 2 diabetes mellitus with diabetic chronic kidney disease: Secondary | ICD-10-CM | POA: Insufficient documentation

## 2015-06-09 DIAGNOSIS — E780 Pure hypercholesterolemia: Secondary | ICD-10-CM | POA: Insufficient documentation

## 2015-06-09 DIAGNOSIS — Z452 Encounter for adjustment and management of vascular access device: Secondary | ICD-10-CM | POA: Insufficient documentation

## 2015-06-09 DIAGNOSIS — K219 Gastro-esophageal reflux disease without esophagitis: Secondary | ICD-10-CM | POA: Diagnosis not present

## 2015-06-09 DIAGNOSIS — I868 Varicose veins of other specified sites: Secondary | ICD-10-CM | POA: Insufficient documentation

## 2015-06-09 DIAGNOSIS — J449 Chronic obstructive pulmonary disease, unspecified: Secondary | ICD-10-CM | POA: Insufficient documentation

## 2015-06-09 DIAGNOSIS — Z79899 Other long term (current) drug therapy: Secondary | ICD-10-CM | POA: Insufficient documentation

## 2015-06-09 DIAGNOSIS — N186 End stage renal disease: Secondary | ICD-10-CM | POA: Insufficient documentation

## 2015-06-09 HISTORY — PX: PERIPHERAL VASCULAR CATHETERIZATION: SHX172C

## 2015-06-09 SURGERY — DIALYSIS/PERMA CATHETER REMOVAL
Anesthesia: Moderate Sedation

## 2015-06-09 MED ORDER — LIDOCAINE-EPINEPHRINE (PF) 1 %-1:200000 IJ SOLN
INTRAMUSCULAR | Status: AC
  Filled 2015-06-09: qty 30

## 2015-06-09 SURGICAL SUPPLY — 1 items: TRAY LACERAT/PLASTIC (MISCELLANEOUS) ×3 IMPLANT

## 2015-06-09 NOTE — Op Note (Signed)
  OPERATIVE NOTE   PROCEDURE: 1. Removal of a Right internal jugular tunneled dialysis catheter  PRE-OPERATIVE DIAGNOSIS: Complication of dialysis catheter, End stage renal disease  POST-OPERATIVE DIAGNOSIS: Same  SURGEON: Schnier, Dolores Lory, M.D.  ANESTHESIA: Local anesthetic with 1% lidocaine with epinephrine   ESTIMATED BLOOD LOSS: Minimal   FINDING(S): 1. Catheter intact   SPECIMEN(S):  Catheter  INDICATIONS:   Phyllis [name] is a 69 y.o. female who presents with a working AV fistula and poor function of the dialysis catheter.  The patient has undergone placement of an extremity access which is working and this has been successfully cannulated without difficulty.  therefore is undergoing removal of his tunneled catheter which is no longer needed to avoid septic complications.   DESCRIPTION: After obtaining full informed written consent, the patient was positioned supine. The right catheter and surrounding area is prepped and draped in a sterile fashion. The cuff was localized by palpation and noted to be less than 3 cm from the exit site. After appropriate timeout is called, 1% lidocaine with epinephrine is infiltrated into the surrounding tissues around the cuff. Small transverse incision is created at the exit site with an 11 blade scalpel and the dissection was carried up along the catheter to expose the cuff of the tunneled catheter.  The catheter cuff is then freed from the surrounding attachments and adhesions. Once the catheter has been freed circumferentially it is removed in 1 piece. Light pressure was held at the base of the neck.   Antibiotic ointment and a sterile dressing is applied to the exit site. Patient tolerated procedure well and there were no complications.  COMPLICATIONS: None  CONDITION: Unchanged  Schnier, Dolores Lory, M.D. [facility] Office: [phone]  06/09/2015,1:13 PM

## 2015-06-10 ENCOUNTER — Encounter: Payer: Self-pay | Admitting: Vascular Surgery

## 2015-06-11 NOTE — H&P (Signed)
[facility] History & Physical Update  The patient was interviewed and re-examined.  The patient's previous History and Physical has been reviewed and is unchanged.  There is no change in the plan of care. We plan to proceed with the scheduled procedure.  Please see H&P from office dated 05/28/2015   Delana Meyer Dolores Lory, MD  06/11/2015, 12:32 PM

## 2015-07-27 ENCOUNTER — Encounter: Payer: Self-pay | Admitting: Podiatry

## 2015-07-27 ENCOUNTER — Ambulatory Visit: Payer: Medicare Other

## 2015-07-27 ENCOUNTER — Ambulatory Visit (INDEPENDENT_AMBULATORY_CARE_PROVIDER_SITE_OTHER): Payer: Medicare Other | Admitting: Podiatry

## 2015-07-27 VITALS — BP 116/63 | HR 105 | Resp 18

## 2015-07-27 DIAGNOSIS — M79672 Pain in left foot: Secondary | ICD-10-CM

## 2015-07-27 DIAGNOSIS — M795 Residual foreign body in soft tissue: Secondary | ICD-10-CM

## 2015-07-27 MED ORDER — DOXYCYCLINE HYCLATE 100 MG PO TABS: 100.0000 mg | ORAL_TABLET | Freq: Two times a day (BID) | ORAL | Status: DC

## 2015-07-28 NOTE — Progress Notes (Signed)
Patient ID: Phyllis [name], female   DOB: 1946/01/27, 69 y.o.   MRN: [id number]   Subjective: 69 year old female presents the office or concerns or glass the bottom of her foot on the ball the fifth toe. She states that over the weekend she was cutting stain glass step on a piece. She states that she did not remove any glass her foot she was there is still a piece in there and she can feel it when she walks and puts pressure to her foot. She denies any swelling or redness to the area. She denies any systemic complaints as fevers, chills, nausea, vomiting. No calf pain, chest pain, shards of breath. She's had no recent treatment for this. No other complaints at this time.  Objective: AAO 3, NAD DP/PT pulses palpable, CRT less than 3 seconds protective sensation appears to be intact [employer] monofilament On the plantar aspect of the left foot sub-metatarsal 5 there is an area of what appears to be of puncture wound. There is small pieces of black foreign objects visible. Upon debridement small pieces of black appearing class was removed. Subjectively she states that she was cutting black glass. Upon further debridement puncture site was visible however no further foreign body was identified. There is no drainage or purulence. There is no swelling erythema, ascending cellulitis, fluctuance, crepitus, malodor. No other areas of open lesions or puncture site identified. No pain with calf compression, swelling, warmth, erythema.  Assessment: Left foot foreign body  Plan: -X-rays were obtained and reviewed with the patient. No visible foreign body was identified at this time. -Treatment options discussed including all alternatives, risks, and complications -Discussed exploration and removal of foreign body. The area was sharply debrided with a scalpel. Due to tenderness total of 2 mL of a mixture of 2% lidocaine plain and 0.5% Marcaine plain was infiltrated in a regional block fashion of the area  for anesthesia. Once anesthetized the skin was prepped in sterile fashion. The areas further debrided and multiple pieces a small black appearing glass was removed. Upon further evaluation no further body was identified. Area was irrigated antibiotics when it was applied followed by a bandage. -I discussed with the patient that once the anesthesia wears off she continues to have symptoms to call the office and I will order an ultrasound however it appears like all of the foreign object out of this time. Continue to monitor for any signs or symptoms of infection. -Prescribed doxycycline due to diabetes on dialysis with foreign body -She is unsure about her tetanus and may have been within the last 5 years. Recommended her to call her primary care physician to see she is an update. If she is unable to to go the urgent care for tetanus. She understands. -Monitor for any clinical signs or symptoms of infection and directed to call the office immediately should any occur or go to the ER. -Follow-up 10 days or sooner if any problems arise. In the meantime, encouraged to call the office with any questions, concerns, change in symptoms.   Celesta Gentile, DPM

## 2015-08-02 ENCOUNTER — Ambulatory Visit
Admission: RE | Admit: 2015-08-02 | Discharge: 2015-08-02 | Disposition: A | Discharge status: Home / Self Care | Payer: Medicare Other | Source: Ambulatory Visit | Attending: Internal Medicine | Admitting: Internal Medicine

## 2015-08-02 ENCOUNTER — Other Ambulatory Visit: Payer: Self-pay | Admitting: Internal Medicine

## 2015-08-02 DIAGNOSIS — Z1231 Encounter for screening mammogram for malignant neoplasm of breast: Secondary | ICD-10-CM | POA: Insufficient documentation

## 2015-08-06 ENCOUNTER — Ambulatory Visit (INDEPENDENT_AMBULATORY_CARE_PROVIDER_SITE_OTHER): Payer: Medicare Other | Admitting: Cardiovascular Disease

## 2015-08-06 ENCOUNTER — Encounter: Payer: Self-pay | Admitting: Cardiovascular Disease

## 2015-08-06 VITALS — BP 112/62 | HR 83 | Ht <= 58 in | Wt 144.8 lb

## 2015-08-06 DIAGNOSIS — E785 Hyperlipidemia, unspecified: Secondary | ICD-10-CM

## 2015-08-06 DIAGNOSIS — R0789 Other chest pain: Secondary | ICD-10-CM

## 2015-08-06 DIAGNOSIS — R079 Chest pain, unspecified: Secondary | ICD-10-CM | POA: Diagnosis not present

## 2015-08-06 DIAGNOSIS — E1169 Type 2 diabetes mellitus with other specified complication: Secondary | ICD-10-CM | POA: Diagnosis not present

## 2015-08-06 DIAGNOSIS — I5032 Chronic diastolic (congestive) heart failure: Secondary | ICD-10-CM | POA: Diagnosis not present

## 2015-08-06 DIAGNOSIS — N186 End stage renal disease: Secondary | ICD-10-CM

## 2015-08-06 DIAGNOSIS — R000 Tachycardia, unspecified: Secondary | ICD-10-CM

## 2015-08-06 NOTE — Assessment & Plan Note (Signed)
Fluid status managed by dialysis. She has indicated that she would like to only do dialysis 2 times per week. Given she has been stable, do not think this would be very good idea. Explained this to her. She will talk with Dr. Candiss Norse

## 2015-08-06 NOTE — Assessment & Plan Note (Signed)
Atypical type pain. She reports stress test needed for kidney transplant list.  She is unable to treadmill. Pharmacologic Myoview will be ordered

## 2015-08-06 NOTE — Assessment & Plan Note (Signed)
Currently on pravastatin. Will defer lab work to Dr. Candiss Norse or primary care

## 2015-08-06 NOTE — Assessment & Plan Note (Signed)
Still makes urine , on dialysis 3 days per week managed by Dr. Candiss Norse

## 2015-08-06 NOTE — Assessment & Plan Note (Signed)
We have encouraged continued exercise, careful diet management in an effort to lose weight. 

## 2015-08-06 NOTE — Progress Notes (Signed)
Patient ID: Phyllis [name], female    DOB: 05-Jan-1946, 70 y.o.   MRN: [id number]  HPI Comments: [name] is a very pleasant 69 year old woman with history of chronic renal failure, essentially normal echocardiogram and stress test March 2015, with recent admission to the hospital 12/04/2014 for acute respiratory failure with hypoxia, found to have acute on chronic renal failure, 25 pound weight gain, evidence of heart failure. AV fistula was not working on the left and temporary dialysis catheter was placed and dialysis performed 3 in the hospital. She presents today for chronic diastolic CHF  She reports that she does dialysis 3 days per week,. Monitored by Dr. Candiss Norse. On this regimen, she has been doing well. She does report that she continues to make urine Weight is up from 136 pounds on her last clinic visit March 2016 now 144 pounds She states this is food weight, not water weight When she has fluid, typically has abdominal bloating. Denies any significant symptoms. Reports having some chest discomfort, tenderness to palpation along the ribs, mediastinal area  Monitored by [facility], kidney transplant list. They are requesting pharmacologic Myoview per the patient. Review of her lab work shows hemoglobin A1c 5.1  EKG on today's visit shows normal sinus rhythm with rate 83 bpm, no significant ST or T-wave changes  Other past medical history stress test 02/12/2014 showing no ischemia echocardiogram March 2015 showing normal LV function, no significant valve problems  Allergies  Allergen Reactions  . Codeine Nausea And Vomiting and Nausea Only  . Nitrofurantoin Swelling and Rash    Other Reaction: swelling of body  . Sulfamethoxazole-Trimethoprim Swelling  . Vicodin [Hydrocodone-Acetaminophen] Nausea And Vomiting and Nausea Only  . 2,4-D Dimethylamine (Amisol) Rash    Other Reaction: h/a  . Baclofen Other (See Comments) and Nausea Only    lightheadness  ,drowsiness , muscle weakness , twitching in hands   . Lidoderm [Lidocaine] Rash  . Neosporin [Neomycin-Bacitracin Zn-Polymyx] Other (See Comments) and Rash    Other Reaction: irritation Skin irritation   . Quinine Rash    Other Reaction: Vomiting rash, h/a, vision  . Ultram [Tramadol] Palpitations  . Zocor [Simvastatin] Other (See Comments) and Rash    Other Reaction: muscle spasms Muscle pain and spasms  . Bactrim [Sulfamethoxazole-Trimethoprim] Swelling  . Macrodantin [Nitrofurantoin Macrocrystal] Swelling  . Quinine Derivatives Other (See Comments)    Vertigo,nausea vomiting blurred vision headache ears sensitivity   . Vasotec [Enalapril] Other (See Comments)    Headaches     Outpatient Encounter Prescriptions as of 08/06/2015  Medication Sig  . allopurinol (ZYLOPRIM) 100 MG tablet Take 100 mg by mouth 2 (two) times daily.   Marland Kitchen amLODipine (NORVASC) 10 MG tablet Take 10 mg by mouth.   . budesonide-formoterol (SYMBICORT) 160-4.5 MCG/ACT inhaler Inhale 2 puffs into the lungs 2 (two) times daily.  . calcium acetate, Phos Binder, (PHOSLYRA) 667 MG/5ML SOLN Take by mouth 3 (three) times daily with meals.  . carvedilol (COREG) 12.5 MG tablet Take 12.5 mg by mouth 2 (two) times daily with a meal.   . cetirizine (ZYRTEC) 10 MG tablet Take 10 mg by mouth as needed for allergies.  . cyclobenzaprine (FLEXERIL) 10 MG tablet Take 10 mg by mouth 3 (three) times daily as needed for muscle spasms.  Marland Kitchen docusate sodium (COLACE) 100 MG capsule Take 100 mg by mouth 3 (three) times daily as needed for mild constipation.  Marland Kitchen HYDROcodone-acetaminophen (NORCO) 7.5-325 MG per tablet 1 tablet every 4 (four) hours  as needed.   Marland Kitchen ipratropium-albuterol (DUONEB) 0.5-2.5 (3) MG/3ML SOLN Take 3 mLs by nebulization.  . mometasone (NASONEX) 50 MCG/ACT nasal spray Place 2 sprays into the nose daily.  Marland Kitchen omeprazole (PRILOSEC) 20 MG capsule Take 20 mg by mouth 2 (two) times daily before a meal.  . pravastatin  (PRAVACHOL) 40 MG tablet Take 40 mg by mouth daily.   Marland Kitchen senna (SENOKOT) 8.6 MG tablet Take 1 tablet by mouth daily.  Marland Kitchen topiramate (TOPAMAX) 25 MG tablet Take 25 mg by mouth as needed.   . VESICARE 5 MG tablet Take 5 mg by mouth daily.   . Vitamin D, Ergocalciferol, (DRISDOL) 50000 UNITS CAPS capsule Take 50,000 Units by mouth every 7 (seven) days.   . [DISCONTINUED] doxycycline (VIBRA-TABS) 100 MG tablet Take 1 tablet (100 mg total) by mouth 2 (two) times daily. (Patient not taking: Reported on 08/06/2015)   No facility-administered encounter medications on file as of 08/06/2015.    Past Medical History  Diagnosis Date  . Diabetes mellitus without complication   . Allergy   . Chronic kidney disease   . Hypertension   . Asthma   . COPD (chronic obstructive pulmonary disease)   . Dialysis patient     3 days every week.   . Sleep apnea   . GERD (gastroesophageal reflux disease)   . Gout   . 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     right chest  . Fistula     lower left arm  . Dialysis patient     Past Surgical History  Procedure Laterality Date  . Gallbladder surgery    . Carpel tunnel    . Peripheral vascular catheterization N/A 04/12/2015    Procedure: A/V Shuntogram/Fistulagram;  Surgeon: Algernon Huxley, MD;  Location: [facility];  Service: Cardiovascular;  Laterality: N/A;  . Peripheral vascular catheterization N/A 04/12/2015    Procedure: A/V Shunt Intervention;  Surgeon: Algernon Huxley, MD;  Location: [facility];  Service: Cardiovascular;  Laterality: N/A;  . Peripheral vascular catheterization N/A 06/09/2015    Procedure: Dialysis/Perma Catheter Removal;  Surgeon: Katha Cabal, MD;  Location: [place] [facility];  Service: Cardiovascular;  Laterality: N/A;    Social History  reports that she has never smoked. She has never used smokeless tobacco. She reports that she does not drink alcohol or use illicit drugs.  Family History family history  includes Heart attack in her brother; Heart attack (age of onset: 87) in her father; Heart disease in her brother and father; Hyperlipidemia in her father and mother; Hypertension in her father and mother.   Review of Systems  Constitutional: Negative.   Respiratory: Negative.   Cardiovascular: Negative.   Gastrointestinal: Negative.   Musculoskeletal: Negative.   Neurological: Negative.   Hematological: Negative.   Psychiatric/Behavioral: Negative.   All other systems reviewed and are negative.   BP 112/62 mmHg  Pulse 83  Ht 4\' 10"  (1.473 m)  Wt 144 lb 12 oz (65.658 kg)  BMI 30.26 kg/m2  Physical Exam  Constitutional: She is oriented to person, place, and time. She appears well-developed and well-nourished.  HENT:  Head: Normocephalic.  Nose: Nose normal.  Mouth/Throat: Oropharynx is clear and moist.  Eyes: Conjunctivae are normal. Pupils are equal, round, and reactive to light.  Neck: Normal range of motion. Neck supple. No JVD present.  Cardiovascular: Normal rate, regular rhythm, S1 normal, S2 normal, normal heart sounds and intact distal pulses.  Exam reveals no  gallop and no friction rub.   No murmur heard. Pulmonary/Chest: Effort normal and breath sounds normal. No respiratory distress. She has no wheezes. She has no rales. She exhibits no tenderness.  Abdominal: Soft. Bowel sounds are normal. She exhibits no distension. There is no tenderness.  Musculoskeletal: Normal range of motion. She exhibits no edema or tenderness.  Lymphadenopathy:    She has no cervical adenopathy.  Neurological: She is alert and oriented to person, place, and time. Coordination normal.  Skin: Skin is warm and dry. No rash noted. No erythema.  Psychiatric: She has a normal mood and affect. Her behavior is normal. Judgment and thought content normal.    Assessment and Plan  Nursing note and vitals reviewed.

## 2015-08-06 NOTE — Patient Instructions (Addendum)
You are doing well. No medication changes were made.  We will set up a pharmacological myoview  Please call us if you have new issues that need to be addressed before your next appt.  Your physician wants you to follow-up in: 12 months.  You will receive a reminder letter in the mail two months in advance. If you don't receive a letter, please call our office to schedule the follow-up appointment.  [place]  Your caregiver has ordered a Stress Test with nuclear imaging. The purpose of this test is to evaluate the blood supply to your heart muscle. This procedure is referred to as a "Non-Invasive Stress Test." This is because other than having an IV started in your vein, nothing is inserted or "invades" your body. Cardiac stress tests are done to find areas of poor blood flow to the heart by determining the extent of coronary artery disease (CAD). Some patients exercise on a treadmill, which naturally increases the blood flow to your heart, while others who are  unable to walk on a treadmill due to physical limitations have a pharmacologic/chemical stress agent called Lexiscan . This medicine will mimic walking on a treadmill by temporarily increasing your coronary blood flow.   Please note: these test may take anywhere between 2-4 hours to complete  PLEASE REPORT TO [place] AT THE FIRST DESK WILL DIRECT YOU WHERE TO GO  Date of Procedure:__Monday, October 3_______  Arrival Time for Procedure:___7:15 am__________  Instructions regarding medication:   __X__:  Hold CARVEDILOL the night before procedure and morning of procedure   PLEASE NOTIFY THE OFFICE AT LEAST 24 HOURS IN ADVANCE IF YOU ARE UNABLE TO KEEP YOUR APPOINTMENT.  [phone] AND  PLEASE NOTIFY NUCLEAR MEDICINE AT [facility] AT LEAST 24 HOURS IN ADVANCE IF YOU ARE UNABLE TO KEEP YOUR APPOINTMENT. [phone]  How to prepare for your Myoview test:   Do not eat or drink after midnight  No  caffeine for 24 hours prior to test  No smoking 24 hours prior to test.  Your medication may be taken with water.  If your doctor stopped a medication because of this test, do not take that medication.  Ladies, please do not wear dresses.  Skirts or pants are appropriate. Please wear a short sleeve shirt.  No perfume, cologne or lotion.  Cardiac Nuclear Scanning A cardiac nuclear scan is used to check your heart for problems, such as the following:  A portion of the heart is not getting enough blood.  Part of the heart muscle has died, which happens with a heart attack.  The heart wall is not working normally.  In this test, a radioactive dye (tracer) is injected into your bloodstream. After the tracer has traveled to your heart, a scanning device is used to measure how much of the tracer is absorbed by or distributed to various areas of your heart. LET [facility] CARE PROVIDER KNOW ABOUT:  Any allergies you have.  All medicines you are taking, including vitamins, herbs, eye drops, creams, and over-the-counter medicines.  Previous problems you or members of your family have had with the use of anesthetics.  Any blood disorders you have.  Previous surgeries you have had.  Medical conditions you have.  RISKS AND COMPLICATIONS Generally, this is a safe procedure. However, as with any procedure, problems can occur. Possible problems include:   Serious chest pain.  Rapid heartbeat.  Sensation of warmth in your chest. This usually passes quickly.  BEFORE THE PROCEDURE Ask your health care provider about changing or stopping your regular medicines. PROCEDURE This procedure is usually done at a hospital and takes 2-4 hours.  An IV tube is inserted into one of your veins.  Your health care provider will inject a small amount of radioactive tracer through the tube.  You will then wait for 20-40 minutes while the tracer travels through your bloodstream.  You will lie down on  an exam table so images of your heart can be taken. Images will be taken for about 15-20 minutes.  You will exercise on a treadmill or stationary bike. While you exercise, your heart activity will be monitored with an electrocardiogram (ECG), and your blood pressure will be checked.  If you are unable to exercise, you may be given a medicine to make your heart beat faster.  When blood flow to your heart has peaked, tracer will again be injected through the IV tube.  After 20-40 minutes, you will get back on the exam table and have more images taken of your heart.  When the procedure is over, your IV tube will be removed. AFTER THE PROCEDURE  You will likely be able to leave shortly after the test. Unless your health care provider tells you otherwise, you may return to your normal schedule, including diet, activities, and medicines.  Make sure you find out how and when you will get your test results. Document Released: 12/01/2004 Document Revised: 11/11/2013 Document Reviewed: 10/15/2013 [facility] Patient Information 2015 [place], [place]. This information is not intended to replace advice given to you by your health care provider. Make sure you discuss any questions you have with your health care provider.

## 2015-08-17 ENCOUNTER — Encounter: Payer: Self-pay | Admitting: Podiatry

## 2015-08-17 ENCOUNTER — Ambulatory Visit (INDEPENDENT_AMBULATORY_CARE_PROVIDER_SITE_OTHER): Payer: Medicare Other | Admitting: Podiatry

## 2015-08-17 VITALS — BP 144/69 | HR 86 | Resp 16

## 2015-08-17 DIAGNOSIS — M79672 Pain in left foot: Secondary | ICD-10-CM | POA: Diagnosis not present

## 2015-08-17 DIAGNOSIS — M795 Residual foreign body in soft tissue: Secondary | ICD-10-CM | POA: Diagnosis not present

## 2015-08-17 NOTE — Progress Notes (Signed)
Patient ID: Phyllis [name], female   DOB: 1945/11/21, 69 y.o.   MRN: [id number]  Subjective: 69 year old female presents the office they for follow-up evaluation of removal of foreign body to her left foot . She states that she had resolution of pain within 2 days after last appointment. She denies any redness or swelling to her foot. She has no pain bilaterally. No other complaints at this time in no acute changes since last appointment.  Objective: AAO 3, NAD Neurovascular status intact and unchanged At this time there is no evidence of foreign body or puncture site of bilateral lower extremity's. There is no open lesion the site of the foreign body removal on the left side. There is no tenderness palpation around the area. There is no overlying edema, erythema, increase in warmth. No open lesions or pre-ulcerative lesions bilaterally. There is no pain with calf compression, swelling, warmth, erythema.  Assessment: 69 year old female with resolved symptoms after form body removal left foot  Plan: -Treatment options discussed including all alternatives, risks, and complications -Continue to monitor for any reoccurrence. Discussed that as important to wear shoes and socks. Discussed importance of daily foot inspection. Follow-up as needed. Call the office with any questions, concerns, change in symptoms.  Celesta Gentile, DPM

## 2015-08-23 ENCOUNTER — Ambulatory Visit
Admission: RE | Admit: 2015-08-23 | Discharge: 2015-08-23 | Disposition: A | Discharge status: Home / Self Care | Payer: Medicare Other | Source: Ambulatory Visit | Attending: Cardiovascular Disease | Admitting: Cardiovascular Disease

## 2015-08-23 DIAGNOSIS — R079 Chest pain, unspecified: Secondary | ICD-10-CM

## 2015-08-23 DIAGNOSIS — R000 Tachycardia, unspecified: Secondary | ICD-10-CM

## 2015-08-27 ENCOUNTER — Encounter
Admission: RE | Admit: 2015-08-27 | Discharge: 2015-08-27 | Disposition: A | Discharge status: Home / Self Care | Payer: Medicare Other | Source: Ambulatory Visit | Attending: Cardiovascular Disease | Admitting: Cardiovascular Disease

## 2015-08-27 DIAGNOSIS — R000 Tachycardia, unspecified: Secondary | ICD-10-CM

## 2015-08-27 DIAGNOSIS — R079 Chest pain, unspecified: Secondary | ICD-10-CM

## 2015-08-27 MED ORDER — TECHNETIUM TC 99M SESTAMIBI - CARDIOLITE
33.0000 | Freq: Once | INTRAVENOUS | Status: AC | PRN
  Administered 2015-08-27: 30.465 via INTRAVENOUS

## 2015-08-27 MED ORDER — TECHNETIUM TC 99M SESTAMIBI - CARDIOLITE
13.0000 | Freq: Once | INTRAVENOUS | Status: AC | PRN
Start: 2015-08-27 — End: 2015-08-27
  Administered 2015-08-27: 08:00:00 12.4 via INTRAVENOUS

## 2015-08-27 MED ORDER — REGADENOSON 0.4 MG/5ML IV SOLN
0.4000 mg | Freq: Once | INTRAVENOUS | Status: DC
  Filled 2015-08-27: qty 5

## 2015-08-30 LAB — NM MYOCAR MULTI W/SPECT W/WALL MOTION / EF
LV dias vol: 58 mL
LV sys vol: 23 mL
Peak HR: 109 {beats}/min
Percent HR: 72 %
Rest HR: 79 {beats}/min
SDS: 1
SRS: 2
SSS: 1
TID: 1.12

## 2015-09-01 ENCOUNTER — Other Ambulatory Visit: Payer: Self-pay

## 2015-09-01 DIAGNOSIS — R079 Chest pain, unspecified: Secondary | ICD-10-CM

## 2015-10-01 ENCOUNTER — Ambulatory Visit (INDEPENDENT_AMBULATORY_CARE_PROVIDER_SITE_OTHER): Payer: Medicare Other

## 2015-10-01 ENCOUNTER — Other Ambulatory Visit: Payer: Self-pay

## 2015-10-01 DIAGNOSIS — R079 Chest pain, unspecified: Secondary | ICD-10-CM | POA: Diagnosis not present

## 2016-04-26 ENCOUNTER — Ambulatory Visit: Payer: Medicare Other | Admitting: Podiatry

## 2016-05-08 ENCOUNTER — Ambulatory Visit (INDEPENDENT_AMBULATORY_CARE_PROVIDER_SITE_OTHER): Payer: Medicare Other | Admitting: Podiatry

## 2016-05-08 ENCOUNTER — Encounter: Payer: Self-pay | Admitting: Podiatry

## 2016-05-08 VITALS — BP 167/77 | HR 88 | Resp 12

## 2016-05-08 DIAGNOSIS — E1142 Type 2 diabetes mellitus with diabetic polyneuropathy: Secondary | ICD-10-CM | POA: Diagnosis not present

## 2016-05-08 DIAGNOSIS — B351 Tinea unguium: Secondary | ICD-10-CM

## 2016-05-08 DIAGNOSIS — M79676 Pain in unspecified toe(s): Secondary | ICD-10-CM | POA: Diagnosis not present

## 2016-05-08 DIAGNOSIS — M722 Plantar fascial fibromatosis: Secondary | ICD-10-CM

## 2016-05-08 NOTE — Progress Notes (Signed)
She presents today with chief complaint of ankle pain right she's also complaining of painful elongated toenails. She is also complaining of bilateral plantar fasciitis.  Objective: Vital signs are stable she is alert and oriented 3. Pulses are palpable. She has pain on palpation medial calcaneal tubercles bilateral heels and pain on palpation to thick yellow dystrophic onychomycotic nails bilaterally. Evaluation of the right ankle does not use any type of major abnormality nor does it demonstrate any fluctuance or any suspicion of a tear.  Assessment: Plantar fasciitis bilateral. Pain in limb secondary to onychomycosis and diabetic peripheral neuropathy.  Plan: She is scan for new set of orthotics today. I also injected the bilateral heels today with Kenalog and local anesthetic. And I also debrided her nails for her today is covered service secondary to pain.

## 2016-06-23 ENCOUNTER — Encounter: Payer: Self-pay | Admitting: *Deleted

## 2016-07-10 ENCOUNTER — Encounter: Payer: Self-pay | Admitting: Podiatry

## 2016-07-10 ENCOUNTER — Ambulatory Visit (INDEPENDENT_AMBULATORY_CARE_PROVIDER_SITE_OTHER): Payer: Medicare Other | Admitting: Podiatry

## 2016-07-10 DIAGNOSIS — M722 Plantar fascial fibromatosis: Secondary | ICD-10-CM | POA: Diagnosis not present

## 2016-07-10 NOTE — Progress Notes (Signed)
She presents today with a chief complaint of painful bilateral heels. She states that she has to have a exercise induced stress test which she cannot walk.  Objective: Vital signs are stable she is alert and oriented 3 pulses are strongly palpable posterior tibial and dorsalis pedis bilateral. Capillary fill time is immediate. Venous distention of the veins is present. Toenails are thick but not painful. She has pain on palpation medial calcaneal tubercles bilateral.  Assessment: Diabetes mellitus with diabetic peripheral neuropathy as well as kidney failure. And is on dialysis. She has plantar fasciitis bilateral.  Plan: I injected her bilateral heels today with Kenalog and local anesthetic and we'll follow-up with her on an as-needed basis.

## 2016-08-08 ENCOUNTER — Other Ambulatory Visit
Admission: RE | Admit: 2016-08-08 | Discharge: 2016-08-08 | Disposition: A | Discharge status: Home / Self Care | Payer: Medicare Other | Source: Other Acute Inpatient Hospital | Attending: Nephrology | Admitting: Nephrology

## 2016-08-08 DIAGNOSIS — N186 End stage renal disease: Secondary | ICD-10-CM | POA: Insufficient documentation

## 2016-08-08 LAB — POTASSIUM: Potassium: 2.6 mmol/L — CL (ref 3.5–5.1)

## 2016-09-15 ENCOUNTER — Other Ambulatory Visit: Payer: Self-pay | Admitting: Internal Medicine

## 2016-09-15 DIAGNOSIS — Z1231 Encounter for screening mammogram for malignant neoplasm of breast: Secondary | ICD-10-CM

## 2016-10-23 ENCOUNTER — Ambulatory Visit: Payer: Medicare Other | Attending: Rheumatology | Admitting: Physical Therapy

## 2016-10-23 ENCOUNTER — Encounter: Payer: Self-pay | Admitting: Physical Therapy

## 2016-10-23 DIAGNOSIS — R262 Difficulty in walking, not elsewhere classified: Secondary | ICD-10-CM | POA: Diagnosis present

## 2016-10-23 DIAGNOSIS — M6281 Muscle weakness (generalized): Secondary | ICD-10-CM | POA: Insufficient documentation

## 2016-10-24 NOTE — Therapy (Signed)
[facility] [address], [place], [address] Phone: [phone]   Fax:  [phone]  Physical Therapy Evaluation  Patient Details  Name: Phyllis [name] MRN: [id number] Date of Birth: 01-08-1946 Referring Provider: Cynda Familia MD  Encounter Date: 10/23/2016      PT End of Session - 10/23/16 1027    Visit Number 1   Number of Visits 12   Date for PT Re-Evaluation 12/04/16   Authorization Type 1   Authorization Time Period 10 (G-code)   PT Start Time [phone]   PT Stop Time 1025   PT Time Calculation (min) 48 min   Activity Tolerance Patient tolerated treatment well   Behavior During Therapy [facility] for tasks assessed/performed      Past Medical History:  Diagnosis Date  . Allergy   . Asthma   . Chronic kidney disease   . COPD (chronic obstructive pulmonary disease) ([place])   . Diabetes mellitus without complication ([place])   . Dialysis patient ([place])    3 days every week.   . Dialysis patient ([place])   . Fistula    lower left arm  . GERD (gastroesophageal reflux disease)   . Gout   . Hypertension   . 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 Sleep apnea     Past Surgical History:  Procedure Laterality Date  . carpel tunnel    . GALLBLADDER SURGERY    . PERIPHERAL VASCULAR CATHETERIZATION N/A 04/12/2015   Procedure: A/V Shuntogram/Fistulagram;  Surgeon: Algernon Huxley, MD;  Location: [facility];  Service: Cardiovascular;  Laterality: N/A;  . PERIPHERAL VASCULAR CATHETERIZATION N/A 04/12/2015   Procedure: A/V Shunt Intervention;  Surgeon: Algernon Huxley, MD;  Location: [place] [facility];  Service: Cardiovascular;  Laterality: N/A;  . PERIPHERAL VASCULAR CATHETERIZATION N/A 06/09/2015   Procedure: Dialysis/Perma Catheter Removal;  Surgeon: Katha Cabal, MD;  Location: [place] [facility];  Service: Cardiovascular;  Laterality: N/A;    There were no vitals filed for  this visit.       Subjective Assessment - 10/23/16 0956    Subjective Patient reports she is having difficulty with walking long distances (>3 min. she feels weak).    Pertinent History Patient reports history of kidney problems and is on dialysis currently since last year, 2x/week. Plantar fascitis within the past year with injections within the past 6 months. also reports she has had back pain intermittently with activity over the past >10 years.    Limitations House hold activities;Walking;Standing   Patient Stated Goals be able to walk further for community ambulation; shopping   Currently in Pain? No/denies            [facility] PT Assessment - 10/23/16 0948      Assessment   Medical Diagnosis bilateral leg weakness (R29.898)   Referring Provider Cynda Familia MD   Onset Date/Surgical Date 10/21/15  worsening in the past 5-6 months   Hand Dominance Right   Next MD Visit none   Prior Therapy 2016 skilled nursing care      Precautions   Precautions None     Restrictions   Weight Bearing Restrictions No     Balance Screen   Has the patient fallen in the past 6 months Yes   How many times? 1  tripped over a rug   Has the patient had a decrease in activity level because of  a fear of falling?  No   Is the patient reluctant to leave their home because of a fear of falling?  No     Home Environment   Living Environment Private residence   Living Arrangements Non-relatives/Friends   Type of [place] to enter   Entrance Stairs-Number of Steps 4   Entrance Stairs-Rails Left  going up   [place] - single point;Shower seat - built in;Grab bars - tub/shower;Hand held shower head     Prior Function   Level of Independence Independent   Vocation Retired   Biomedical scientist retired Pharmacist, hospital   Leisure stained glass, wood working     Charity fundraiser Status Within [employer] for tasks  assessed     Objective: Gait: ambulating with decreased trunk rotation, decreased step length, decreased hip/knee flexion with swing through AROM: LE's hip and knee WNL all planes of motion  Strength: RLE hip flexion 4-/5, hip extension grossly 3/5, ER 4-/5 Knee extension 4-/5, flexion 4/5 LLE hip flexion 4/5, hip extension grossly 3/5, ER 4-/5 Knee extension 4-/5, flexion 4-/5  Outcome measures: 5x <>stand 16.5 seconds: using UE for assist (decreased: norm for age 5.6 seconds without use of UE's: in need of intervention) 10MW  12.22 seconds: .2m/s (decresaed: norm .833 - 1.3 m/s: in need of intervention) 6 min walk test to be assessed LEFS: 27/80 (80 = no self perceived impairment/disability) Patient with self perceived disability to perform daily tasks, decreased ability getting in/out of car, stair climbing, standing, walking even short distances        PT Education - 10/23/16 1030    Education provided Yes   Education Details HEP: sitting hip adduction with glute sets, hip abduction with band, ball roll outs    Person(s) Educated Patient   Methods Explanation;Demonstration;Verbal cues;Handout   Comprehension Verbalized understanding;Returned demonstration;Verbal cues required             PT Long Term Goals - 10/23/16 1055      PT LONG TERM GOAL #1   Title Patient will demonstrate improved function with daily tasks at home and in community with LEFS score of 40/80 or better by 12/04/2016   Baseline LEFS 27/80   Status New     PT LONG TERM GOAL #2   Title Patient with demonstrate improved functional strength to demonstrate improved LE function with transfers, walking as indicated by 5x sit to stand in < 13 seconds by 12/04/2016   Baseline 16.5 seconds (norm = 12.6 for age group)   Status New     PT LONG TERM GOAL #3   Title patient will demonstrate improved community ambulation ability as indicated by 10MW of 27m/s or better by 12/04/2016   Baseline current:  0.18m/s   Status New     PT LONG TERM GOAL #4   Title Patient will be independent with home program to continue with strength, balance/endurance by discharge to continue self management by 12/04/2016   Baseline limited knowledge of appropriate exercises and progression to improve function   Status New               Plan - 10/23/16 1038    Clinical Impression Statement Patient is a 70 year old female who presents with decreased endurance/strength in LE's with worsening symptoms over the past 6 months. She has limitations with walking and daily tasks that limit household and community ambulation safely. Her  LEFS score of 27.5 and 10MW test and 5x sit to stand all demonstrate moderate impairments and are in need of intervention to allow patient to return to improved functional level. She has limited knoweldge of appropriate exercises and progression to assist with improving strength and endurance and will benefit from skilled physical therapy intervention to achieve goals for improved community ambulation.    Rehab Potential Good   Clinical Impairments Affecting Rehab Potential (+) motivated (-)comorbidities, age, chronic back pain   PT Frequency 2x / week   PT Duration 6 weeks   PT Treatment/Interventions Patient/family education;Electrical Stimulation;Cryotherapy;Moist Heat;Neuromuscular re-education;Therapeutic exercise;Manual techniques   PT Next Visit Plan therapeutic exercise   PT Home Exercise Plan LE exercises, core exercises   Consulted and Agree with Plan of Care Patient      Patient will benefit from skilled therapeutic intervention in order to improve the following deficits and impairments:  Decreased strength, Impaired perceived functional ability, Decreased activity tolerance, Decreased endurance, Difficulty walking  Visit Diagnosis: Difficulty in walking, not elsewhere classified - Plan: PT plan of care cert/re-cert  Muscle weakness (generalized) - Plan: PT plan of care  cert/re-cert      G-Codes - 48/88/91 1033    Functional Assessment Tool Used 10MW, 5x sit to stand, LEFS, strength, clinical judgment   Functional Limitation Mobility: Walking and moving around   Mobility: Walking and Moving Around Current Status ([id number]) At least 40 percent but less than 60 percent impaired, limited or restricted   Mobility: Walking and Moving Around Goal Status [phone]) At least 20 percent but less than 40 percent impaired, limited or restricted       Problem List Patient Active Problem List   Diagnosis Date Noted  . Chest discomfort 08/06/2015  . Hyperlipidemia 08/06/2015  . Complication from renal dialysis device 04/12/2015  . SOB (shortness of breath) 02/01/2015  . End stage renal disease ([place]) 02/01/2015  . 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 Type 2 diabetes mellitus with other specified complication ([place]) 80/01/4916  . Asthma 02/01/2015  . Chronic diastolic CHF (congestive heart failure) ([place]) 02/01/2015    Jomarie Longs PT 10/24/2016, 1:11 PM  [facility] [address] [place], [address] Phone: [phone]   Fax:  [phone]  Name: MYLIYAH REBUCK MRN: [id number] Date of Birth: March 02, 1946

## 2016-10-25 ENCOUNTER — Ambulatory Visit: Payer: Medicare Other | Admitting: Physical Therapy

## 2016-10-27 ENCOUNTER — Ambulatory Visit
Admission: RE | Admit: 2016-10-27 | Discharge: 2016-10-27 | Disposition: A | Discharge status: Home / Self Care | Payer: Medicare Other | Source: Ambulatory Visit | Attending: Internal Medicine | Admitting: Internal Medicine

## 2016-10-27 DIAGNOSIS — Z1231 Encounter for screening mammogram for malignant neoplasm of breast: Secondary | ICD-10-CM | POA: Insufficient documentation

## 2016-10-31 ENCOUNTER — Ambulatory Visit: Payer: Medicare Other | Admitting: Physical Therapy

## 2016-10-31 ENCOUNTER — Encounter: Payer: Self-pay | Admitting: Physical Therapy

## 2016-10-31 DIAGNOSIS — M6281 Muscle weakness (generalized): Secondary | ICD-10-CM

## 2016-10-31 DIAGNOSIS — R262 Difficulty in walking, not elsewhere classified: Secondary | ICD-10-CM

## 2016-10-31 NOTE — Therapy (Signed)
[facility] [address] [place], [address] Phone: [phone]   Fax:  [phone]  Physical Therapy Treatment  Patient Details  Name: Phyllis [name] MRN: [id number] Date of Birth: 01/09/1946 Referring Provider: Cynda Familia MD  Encounter Date: 10/31/2016      PT End of Session - 10/31/16 1630    Visit Number 2   Number of Visits 12   Date for PT Re-Evaluation 12/04/16   Authorization Type 2   Authorization Time Period 10 (G-code)   PT Start Time 1625   PT Stop Time 1655   PT Time Calculation (min) 30 min   Activity Tolerance Patient tolerated treatment well;Patient limited by fatigue   Behavior During Therapy [facility] for tasks assessed/performed      Past Medical History:  Diagnosis Date  . Allergy   . Asthma   . Chronic kidney disease   . COPD (chronic obstructive pulmonary disease) ([place])   . Diabetes mellitus without complication ([place])   . Dialysis patient ([place])    3 days every week.   . Dialysis patient ([place])   . Fistula    lower left arm  . GERD (gastroesophageal reflux disease)   . Gout   . Hypertension   . 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 Sleep apnea     Past Surgical History:  Procedure Laterality Date  . carpel tunnel    . GALLBLADDER SURGERY    . PERIPHERAL VASCULAR CATHETERIZATION N/A 04/12/2015   Procedure: A/V Shuntogram/Fistulagram;  Surgeon: Algernon Huxley, MD;  Location: [facility];  Service: Cardiovascular;  Laterality: N/A;  . PERIPHERAL VASCULAR CATHETERIZATION N/A 04/12/2015   Procedure: A/V Shunt Intervention;  Surgeon: Algernon Huxley, MD;  Location: [place] [facility];  Service: Cardiovascular;  Laterality: N/A;  . PERIPHERAL VASCULAR CATHETERIZATION N/A 06/09/2015   Procedure: Dialysis/Perma Catheter Removal;  Surgeon: Katha Cabal, MD;  Location: [place] [facility];  Service: Cardiovascular;  Laterality: N/A;     There were no vitals filed for this visit.      Subjective Assessment - 10/31/16 1628    Subjective Patient reports she feels tired today. She had to walk quite a bit and is fatigued from that.    Limitations House hold activities;Walking;Standing   Patient Stated Goals be able to walk further for community ambulation; shopping   Currently in Pain? No/denies     Objective: Gait: slow cadence, ambulating without AD Strength: decreased bilateral LE's left>right for hip flexion, knee flexion and extension  Treatment:  Therapeutic exercise: patient performed with VC, tactile cues and demonstration of therapist:  Sitting:  Rocker board for DF/PF x 2 min and side to side x 2 min. Knee extension 2# x 15 reps, no weight x 15 reps Knee flexion red resistive band 2 x 15 reps Hip flexion 2 x 5 reps with 2# weights in thighs Hip adduction with ball and glute sets x 15 reps Hip abduction with resistive band x 15 reps with guidance  Patient response to treatment: Patient demonstrated improved technique with exercises with moderate VC and assistance for correct alignment. Patient with mild to moderate fatigue with exercises and able to ambulate with less difficulty at end of session.        PT Education - 10/31/16 1629    Education provided Yes   Education Details HEP: sitting rocker board, knee extension, flexion, hip abduction with resistive  band   Person(s) Educated Patient   Methods Explanation;Demonstration;Verbal cues   Comprehension Verbalized understanding;Returned demonstration;Verbal cues required             PT Long Term Goals - 10/23/16 1055      PT LONG TERM GOAL #1   Title Patient will demonstrate improved function with daily tasks at home and in community with LEFS score of 40/80 or better by 12/04/2016   Baseline LEFS 27/80   Status New     PT LONG TERM GOAL #2   Title Patient with demonstrate improved functional strength to demonstrate improved LE function  with transfers, walking as indicated by 5x sit to stand in < 13 seconds by 12/04/2016   Baseline 16.5 seconds (norm = 12.6 for age group)   Status New     PT LONG TERM GOAL #3   Title patient will demonstrate improved community ambulation ability as indicated by 10MW of 36m/s or better by 12/04/2016   Baseline current: 0.44m/s   Status New     PT LONG TERM GOAL #4   Title Patient will be independent with home program to continue with strength, balance/endurance by discharge to continue self management by 12/04/2016   Baseline limited knowledge of appropriate exercises and progression to improve function   Status New               Plan - 10/31/16 1707    Clinical Impression Statement Patient tolerated session well with improved motor control and endurance with exercises. she requires guidance and assistance to complete exercises with correct technique and alignment and will benefit from additional physical therapy intervention to achieve goals.    Rehab Potential Good   PT Frequency 2x / week   PT Duration 6 weeks   PT Treatment/Interventions Patient/family education;Electrical Stimulation;Cryotherapy;Moist Heat;Neuromuscular re-education;Therapeutic exercise;Manual techniques   PT Next Visit Plan therapeutic exercise   PT Home Exercise Plan LE exercises, core exercises      Patient will benefit from skilled therapeutic intervention in order to improve the following deficits and impairments:  Decreased strength, Impaired perceived functional ability, Decreased activity tolerance, Decreased endurance, Difficulty walking  Visit Diagnosis: Difficulty in walking, not elsewhere classified  Muscle weakness (generalized)     Problem List Patient Active Problem List   Diagnosis Date Noted  . Chest discomfort 08/06/2015  . Hyperlipidemia 08/06/2015  . Complication from renal dialysis device 04/12/2015  . SOB (shortness of breath) 02/01/2015  . End stage renal disease ([place])  02/01/2015  . 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 Type 2 diabetes mellitus with other specified complication ([place]) 86/75/4492  . Asthma 02/01/2015  . Chronic diastolic CHF (congestive heart failure) ([place]) 02/01/2015    Jomarie Longs PT 11/01/2016, 11:43 AM  [facility] [address], [place], [address] Phone: [phone]   Fax:  [phone]  Name: Phyllis [name] MRN: [id number] Date of Birth: [birth year]

## 2016-11-02 ENCOUNTER — Ambulatory Visit: Payer: Medicare Other | Admitting: Physical Therapy

## 2016-11-02 ENCOUNTER — Encounter: Payer: Self-pay | Admitting: Physical Therapy

## 2016-11-02 DIAGNOSIS — M6281 Muscle weakness (generalized): Secondary | ICD-10-CM

## 2016-11-02 DIAGNOSIS — R262 Difficulty in walking, not elsewhere classified: Secondary | ICD-10-CM

## 2016-11-03 NOTE — Therapy (Signed)
[facility] [address] [place], [address] Phone: [phone]   Fax:  [phone]  Physical Therapy Treatment  Patient Details  Name: Phyllis [name] MRN: [id number] Date of Birth: 03/12/1946 Referring Provider: Cynda Familia MD  Encounter Date: 11/02/2016      PT End of Session - 11/02/16 1619    Visit Number 3   Number of Visits 12   Date for PT Re-Evaluation 12/04/16   Authorization Type 3   Authorization Time Period 10 (G-code)   PT Start Time 1617   PT Stop Time 1645   PT Time Calculation (min) 28 min   Activity Tolerance Patient tolerated treatment well;Patient limited by fatigue   Behavior During Therapy [facility] for tasks assessed/performed      Past Medical History:  Diagnosis Date  . Allergy   . Asthma   . Chronic kidney disease   . COPD (chronic obstructive pulmonary disease) ([place])   . Diabetes mellitus without complication ([place])   . Dialysis patient ([place])    3 days every week.   . Dialysis patient ([place])   . Fistula    lower left arm  . GERD (gastroesophageal reflux disease)   . Gout   . Hypertension   . 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 Sleep apnea     Past Surgical History:  Procedure Laterality Date  . carpel tunnel    . GALLBLADDER SURGERY    . PERIPHERAL VASCULAR CATHETERIZATION N/A 04/12/2015   Procedure: A/V Shuntogram/Fistulagram;  Surgeon: Algernon Huxley, MD;  Location: [place] [facility];  Service: Cardiovascular;  Laterality: N/A;  . PERIPHERAL VASCULAR CATHETERIZATION N/A 04/12/2015   Procedure: A/V Shunt Intervention;  Surgeon: Algernon Huxley, MD;  Location: [place] [facility];  Service: Cardiovascular;  Laterality: N/A;  . PERIPHERAL VASCULAR CATHETERIZATION N/A 06/09/2015   Procedure: Dialysis/Perma Catheter Removal;  Surgeon: Katha Cabal, MD;  Location: [place] [facility];  Service: Cardiovascular;  Laterality: N/A;     There were no vitals filed for this visit.      Subjective Assessment - 11/02/16 1618    Subjective Patient less tired today and did not feel bad following previous session.    Limitations House hold activities;Walking;Standing   Patient Stated Goals be able to walk further for community ambulation; shopping   Currently in Pain? No/denies      Objective:  Treatment:  Therapeutic exercise: patient performed with VC, tactile cues and demonstration of therapist:  Sitting:  Rocker board for DF/PF x 2 min and side to side x 2 min. Knee extension 2# x 15 reps Knee flexion red resistive band 2 x 15 reps Hip flexion 3 x 5 reps with 2# weights in thighs Hip adduction with ball and glute sets x 15 reps Hip abduction with resistive band x 15 reps with guidance Standing: Step ups onto balance pad leading with each LE x 10 reps with VC to clear pad  Patient response to treatment: Patient demonstrated improved endurance with minimal rest periods between exercises and minimal VC/guidance to perform exercises with proper alignment, technique. Mild fatigue with exercises Patient demonstrated improved technique with exercises with moderate VC and assistance for correct alignment.        PT Education - 11/02/16 1644    Education provided Yes   Education Details HEP: as instruced, re assessed technique   Person(s) Educated Patient   Methods Explanation;Verbal cues;Demonstration  Comprehension Verbalized understanding;Returned demonstration;Verbal cues required             PT Long Term Goals - 10/23/16 1055      PT LONG TERM GOAL #1   Title Patient will demonstrate improved function with daily tasks at home and in community with LEFS score of 40/80 or better by 12/04/2016   Baseline LEFS 27/80   Status New     PT LONG TERM GOAL #2   Title Patient with demonstrate improved functional strength to demonstrate improved LE function with transfers, walking as indicated by 5x sit  to stand in < 13 seconds by 12/04/2016   Baseline 16.5 seconds (norm = 12.6 for age group)   Status New     PT LONG TERM GOAL #3   Title patient will demonstrate improved community ambulation ability as indicated by 10MW of 15m/s or better by 12/04/2016   Baseline current: 0.16m/s   Status New     PT LONG TERM GOAL #4   Title Patient will be independent with home program to continue with strength, balance/endurance by discharge to continue self management by 12/04/2016   Baseline limited knowledge of appropriate exercises and progression to improve function   Status New               Plan - 11/02/16 1620    Clinical Impression Statement Patient demonstrated improved endurance this session indicating good carry over with treatment. Patient requires assistance and VC for correct technique and alignment of hip/knee during exercises. She should continue to improved with additional physical therapy intervention.    Rehab Potential Good   PT Frequency 2x / week   PT Duration 6 weeks   PT Treatment/Interventions Patient/family education;Electrical Stimulation;Cryotherapy;Moist Heat;Neuromuscular re-education;Therapeutic exercise;Manual techniques   PT Next Visit Plan therapeutic exercise   PT Home Exercise Plan LE exercises, core exercises      Patient will benefit from skilled therapeutic intervention in order to improve the following deficits and impairments:  Decreased strength, Impaired perceived functional ability, Decreased activity tolerance, Decreased endurance, Difficulty walking  Visit Diagnosis: Difficulty in walking, not elsewhere classified  Muscle weakness (generalized)     Problem List Patient Active Problem List   Diagnosis Date Noted  . Chest discomfort 08/06/2015  . Hyperlipidemia 08/06/2015  . Complication from renal dialysis device 04/12/2015  . SOB (shortness of breath) 02/01/2015  . End stage renal disease ([place]) 02/01/2015  . 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 Type 2 diabetes mellitus with other specified complication ([place]) 89/37/3428  . Asthma 02/01/2015  . Chronic diastolic CHF (congestive heart failure) ([place]) 02/01/2015    Jomarie Longs PT 11/03/2016, 6:57 PM  [facility] [address], [place], [address] Phone: [phone]   Fax:  [phone]  Name: Phyllis [name] MRN: [id number] Date of Birth: 05-Feb-1946

## 2016-11-06 ENCOUNTER — Ambulatory Visit: Payer: Medicare Other | Admitting: Physical Therapy

## 2016-11-06 ENCOUNTER — Encounter: Payer: Self-pay | Admitting: Physical Therapy

## 2016-11-06 DIAGNOSIS — M6281 Muscle weakness (generalized): Secondary | ICD-10-CM

## 2016-11-06 DIAGNOSIS — R262 Difficulty in walking, not elsewhere classified: Secondary | ICD-10-CM

## 2016-11-06 NOTE — Therapy (Signed)
[facility] [address], [place], [address] Phone: [phone]   Fax:  [phone]  Physical Therapy Treatment  Patient Details  Name: Phyllis [name] MRN: [id number] Date of Birth: 1946-10-17 Referring Provider: Cynda Familia MD  Encounter Date: 11/06/2016      PT End of Session - 11/06/16 1540    Visit Number 4   Number of Visits 12   Date for PT Re-Evaluation 12/04/16   Authorization Type 4   Authorization Time Period 10 (G-code)   PT Start Time 1537   PT Stop Time 1605   PT Time Calculation (min) 28 min   Activity Tolerance Patient tolerated treatment well;Patient limited by fatigue   Behavior During Therapy [facility] for tasks assessed/performed      Past Medical History:  Diagnosis Date  . Allergy   . Asthma   . Chronic kidney disease   . COPD (chronic obstructive pulmonary disease) ([place])   . Diabetes mellitus without complication ([place])   . Dialysis patient ([place])    3 days every week.   . Dialysis patient ([place])   . Fistula    lower left arm  . GERD (gastroesophageal reflux disease)   . Gout   . Hypertension   . 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 Sleep apnea     Past Surgical History:  Procedure Laterality Date  . carpel tunnel    . GALLBLADDER SURGERY    . PERIPHERAL VASCULAR CATHETERIZATION N/A 04/12/2015   Procedure: A/V Shuntogram/Fistulagram;  Surgeon: Algernon Huxley, MD;  Location: [place] [facility];  Service: Cardiovascular;  Laterality: N/A;  . PERIPHERAL VASCULAR CATHETERIZATION N/A 04/12/2015   Procedure: A/V Shunt Intervention;  Surgeon: Algernon Huxley, MD;  Location: [place] [facility];  Service: Cardiovascular;  Laterality: N/A;  . PERIPHERAL VASCULAR CATHETERIZATION N/A 06/09/2015   Procedure: Dialysis/Perma Catheter Removal;  Surgeon: Katha Cabal, MD;  Location: [place] [facility];  Service: Cardiovascular;  Laterality: N/A;     There were no vitals filed for this visit.      Subjective Assessment - 11/06/16 1539    Subjective Patient reports she has been busy today in her shop doing drafts.    Limitations House hold activities;Walking;Standing   Patient Stated Goals be able to walk further for community ambulation; shopping   Currently in Pain? No/denies      Objective:  Treatment:  Therapeutic exercise: patient performed with VC, tactile cues and demonstration of therapist:  Sitting:  Rocker board for DF/PF x 2 min and side to side x 2 min. With balance stones on top of board Knee extension 2#  2 x 15 reps Knee flexion red resistive band 2 x 15 reps Hip adduction with ball and glute sets x 15 reps Hip abduction with resistive band x 15 reps with guidance Standing: Step ups onto balance pad leading with each LE x 10 reps with VC to clear pad  Patient response to treatment: Patient demonstrated improved technique with minimal VC and assistance of therapist. Mild fatigue noted with exercises.         PT Education - 11/06/16 1539    Education provided Yes   Education Details HEP:     Person(s) Educated Patient   Methods Explanation;Demonstration;Verbal cues   Comprehension Verbalized understanding;Returned demonstration;Verbal cues required             PT Long Term Goals - 10/23/16  [place] #1   Title Patient will demonstrate improved function with daily tasks at home and in community with LEFS score of 40/80 or better by 12/04/2016   Baseline LEFS 27/80   Status New     PT LONG TERM GOAL #2   Title Patient with demonstrate improved functional strength to demonstrate improved LE function with transfers, walking as indicated by 5x sit to stand in < 13 seconds by 12/04/2016   Baseline 16.5 seconds (norm = 12.6 for age group)   Status New     PT LONG TERM GOAL #3   Title patient will demonstrate improved community ambulation ability as indicated by 10MW of 18m/s or  better by 12/04/2016   Baseline current: 0.70m/s   Status New     PT LONG TERM GOAL #4   Title Patient will be independent with home program to continue with strength, balance/endurance by discharge to continue self management by 12/04/2016   Baseline limited knowledge of appropriate exercises and progression to improve function   Status New               Plan - 11/06/16 1604    Clinical Impression Statement Patient demosntrated improved endurance this session indicating good carry over between visits/ She requires assistance and VC to perform most exercises with correct posiitoning and alignment and will benefit from additional physical therpay interveniton to achieve goals.    Rehab Potential Good   PT Frequency 2x / week   PT Duration 6 weeks   PT Treatment/Interventions Patient/family education;Electrical Stimulation;Cryotherapy;Moist Heat;Neuromuscular re-education;Therapeutic exercise;Manual techniques   PT Next Visit Plan therapeutic exercise   PT Home Exercise Plan LE exercises, core exercises      Patient will benefit from skilled therapeutic intervention in order to improve the following deficits and impairments:  Decreased strength, Impaired perceived functional ability, Decreased activity tolerance, Decreased endurance, Difficulty walking  Visit Diagnosis: Difficulty in walking, not elsewhere classified  Muscle weakness (generalized)     Problem List Patient Active Problem List   Diagnosis Date Noted  . Chest discomfort 08/06/2015  . Hyperlipidemia 08/06/2015  . Complication from renal dialysis device 04/12/2015  . SOB (shortness of breath) 02/01/2015  . End stage renal disease ([place]) 02/01/2015  . 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 Type 2 diabetes mellitus with other specified complication ([place]) 09/38/1829  . Asthma 02/01/2015  . Chronic diastolic CHF (congestive heart failure) ([place]) 02/01/2015    Jomarie Longs PT 11/07/2016, 7:33 PM  [facility] [address], [place], [address] Phone: [phone]   Fax:  [phone]  Name: Phyllis [name] MRN: [id number] Date of Birth: [birth year]

## 2016-11-07 ENCOUNTER — Encounter: Payer: PRIVATE HEALTH INSURANCE | Admitting: Physical Therapy

## 2016-11-09 ENCOUNTER — Ambulatory Visit: Payer: Medicare Other | Admitting: Physical Therapy

## 2016-11-09 ENCOUNTER — Encounter: Payer: Self-pay | Admitting: Physical Therapy

## 2016-11-09 DIAGNOSIS — M6281 Muscle weakness (generalized): Secondary | ICD-10-CM

## 2016-11-09 DIAGNOSIS — R262 Difficulty in walking, not elsewhere classified: Secondary | ICD-10-CM

## 2016-11-09 NOTE — Therapy (Signed)
[facility] [address], [place], [address] Phone: [phone]   Fax:  [phone]  Physical Therapy Treatment  Patient Details  Name: DEONE OMAHONEY MRN: [id number] Date of Birth: [birth year] Referring Provider: Cynda Familia MD  Encounter Date: 11/09/2016      PT End of Session - 11/09/16 1612    Visit Number 5   Number of Visits 12   Date for PT Re-Evaluation 12/04/16   Authorization Type 5   Authorization Time Period 10 (G-code)   PT Start Time 1528   PT Stop Time 1604   PT Time Calculation (min) 36 min   Activity Tolerance Patient tolerated treatment well;Patient limited by fatigue;Patient limited by pain   Behavior During Therapy [facility] for tasks assessed/performed      Past Medical History:  Diagnosis Date  . Allergy   . Asthma   . Chronic kidney disease   . COPD (chronic obstructive pulmonary disease) ([place])   . Diabetes mellitus without complication ([place])   . Dialysis patient ([place])    3 days every week.   . Dialysis patient ([place])   . Fistula    lower left arm  . GERD (gastroesophageal reflux disease)   . Gout   . Hypertension   . 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 Sleep apnea     Past Surgical History:  Procedure Laterality Date  . carpel tunnel    . GALLBLADDER SURGERY    . PERIPHERAL VASCULAR CATHETERIZATION N/A 04/12/2015   Procedure: A/V Shuntogram/Fistulagram;  Surgeon: Algernon Huxley, MD;  Location: [place] [facility];  Service: Cardiovascular;  Laterality: N/A;  . PERIPHERAL VASCULAR CATHETERIZATION N/A 04/12/2015   Procedure: A/V Shunt Intervention;  Surgeon: Algernon Huxley, MD;  Location: [place] [facility];  Service: Cardiovascular;  Laterality: N/A;  . PERIPHERAL VASCULAR CATHETERIZATION N/A 06/09/2015   Procedure: Dialysis/Perma Catheter Removal;  Surgeon: Katha Cabal, MD;  Location: [place] [facility];  Service: Cardiovascular;   Laterality: N/A;    There were no vitals filed for this visit.      Subjective Assessment - 11/09/16 1532    Subjective Patient reports she has been busy today shopping and walking quite a bit and is doing well.    Pertinent History Patient reports history of kidney problems and is on dialysis currently since last year, 2x/week. Plantar fascitis within the past year with injections within the past 6 months. also reports she has had back pain intermittently with activity over the past >10 years.    Limitations House hold activities;Walking;Standing   Patient Stated Goals be able to walk further for community ambulation; shopping   Currently in Pain? No/denies        Objective: Gait: ambulating without AD, slow cadence, Y ligament walk pattern Strength: both LE's decreased knee extension strength 4-/5, knee flexion 3+/5  Treatment:  Therapeutic exercise: patient performed with VC, tactile cues and demonstration of therapist:  Sitting:  Rocker board for DF/PF x 2 min and side to side x 2 min. With balance stones on top of board Knee extension 2#  2 x 15 reps Knee flexion red resistive band 1 x 15 reps Hip adduction with ball and glute sets x 15 reps Hip abduction with resistive band x 10 reps with guidance and manual resistance at end range hold 5 seconds Hip abduction with red resistive band x 10 reps single leg with guided  motion Standing: Step ups onto balance beam leading with each LE x 10 reps  Lateral step ups each LE x 10 Side step along beam x 1 min. Side stepping with red resistive band around thighs x 15 reps each direction (3 steps x 5 reps) Standing hip extension with toe tap with red resistive band around thighs  NuStep (no charge) x 8 min. At end of session for ROM/endurance level #1-2 workload  Patient response to treatment: patient able to perform all exercises with guided motion to achieve full ROM for extension and flexion of knees. Fatigued with increased  right knee soreness/pain following walking and standing activies, better following NuStep x 7 min.            PT Education - 11/09/16 1600    Education provided Yes   Education Details HEP: continue with exercises with 2# weight and resistive band at home as able   Person(s) Educated Patient   Methods Explanation   Comprehension Verbalized understanding             PT Long Term Goals - 10/23/16 1055      PT LONG TERM GOAL #1   Title Patient will demonstrate improved function with daily tasks at home and in community with LEFS score of 40/80 or better by 12/04/2016   Baseline LEFS 27/80   Status New     PT LONG TERM GOAL #2   Title Patient with demonstrate improved functional strength to demonstrate improved LE function with transfers, walking as indicated by 5x sit to stand in < 13 seconds by 12/04/2016   Baseline 16.5 seconds (norm = 12.6 for age group)   Status New     PT LONG TERM GOAL #3   Title patient will demonstrate improved community ambulation ability as indicated by 10MW of 50m/s or better by 12/04/2016   Baseline current: 0.60m/s   Status New     PT LONG TERM GOAL #4   Title Patient will be independent with home program to continue with strength, balance/endurance by discharge to continue self management by 12/04/2016   Baseline limited knowledge of appropriate exercises and progression to improve function   Status New               Plan - 11/09/16 1616    Clinical Impression Statement Patient demonstrated improved endurance on Nustep and with exercises indicating good carry over between sessions. She requires VC and assistance for exercises to perform with correct technique and good alignment.    Rehab Potential Good   PT Frequency 2x / week   PT Duration 6 weeks   PT Treatment/Interventions Patient/family education;Electrical Stimulation;Cryotherapy;Moist Heat;Neuromuscular re-education;Therapeutic exercise;Manual techniques   PT Next Visit Plan  therapeutic exercise   PT Home Exercise Plan LE exercises, core exercises      Patient will benefit from skilled therapeutic intervention in order to improve the following deficits and impairments:  Decreased strength, Impaired perceived functional ability, Decreased activity tolerance, Decreased endurance, Difficulty walking  Visit Diagnosis: Difficulty in walking, not elsewhere classified  Muscle weakness (generalized)     Problem List Patient Active Problem List   Diagnosis Date Noted  . Chest discomfort 08/06/2015  . Hyperlipidemia 08/06/2015  . Complication from renal dialysis device 04/12/2015  . SOB (shortness of breath) 02/01/2015  . End stage renal disease ([place]) 02/01/2015  . 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 Type 2 diabetes mellitus with other specified complication ([place]) 28/31/5176  . Asthma 02/01/2015  . Chronic diastolic  CHF (congestive heart failure) ([place]) 02/01/2015    Jomarie Longs PT 11/09/2016, 4:21 PM  [facility] [address] [place], [address] Phone: [phone]   Fax:  [phone]  Name: DEBHORA TITUS MRN: [id number] Date of Birth: 1946-05-28

## 2016-11-14 ENCOUNTER — Encounter: Payer: PRIVATE HEALTH INSURANCE | Admitting: Physical Therapy

## 2016-11-15 ENCOUNTER — Ambulatory Visit: Payer: Medicare Other | Admitting: Physical Therapy

## 2016-11-16 ENCOUNTER — Ambulatory Visit: Payer: Medicare Other | Admitting: Physical Therapy

## 2016-11-16 ENCOUNTER — Encounter: Payer: Self-pay | Admitting: Physical Therapy

## 2016-11-16 DIAGNOSIS — R262 Difficulty in walking, not elsewhere classified: Secondary | ICD-10-CM | POA: Diagnosis not present

## 2016-11-16 DIAGNOSIS — M6281 Muscle weakness (generalized): Secondary | ICD-10-CM

## 2016-11-16 NOTE — Therapy (Signed)
[facility] [address], [place], [address] Phone: [phone]   Fax:  [phone]  Physical Therapy Treatment  Patient Details  Name: Phyllis [name] MRN: [id number] Date of Birth: 12-02-1945 Referring Provider: Cynda Familia MD  Encounter Date: 11/16/2016      PT End of Session - 11/16/16 1620    Visit Number 6   Number of Visits 12   Date for PT Re-Evaluation 12/04/16   Authorization Type 6   Authorization Time Period 10 (G-code)   PT Start Time 1614   PT Stop Time 1652   PT Time Calculation (min) 38 min   Activity Tolerance Patient tolerated treatment well;Patient limited by fatigue;Patient limited by pain   Behavior During Therapy [facility] for tasks assessed/performed      Past Medical History:  Diagnosis Date  . Allergy   . Asthma   . Chronic kidney disease   . COPD (chronic obstructive pulmonary disease) ([place])   . Diabetes mellitus without complication ([place])   . Dialysis patient ([place])    3 days every week.   . Dialysis patient ([place])   . Fistula    lower left arm  . GERD (gastroesophageal reflux disease)   . Gout   . Hypertension   . 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 Sleep apnea     Past Surgical History:  Procedure Laterality Date  . carpel tunnel    . GALLBLADDER SURGERY    . PERIPHERAL VASCULAR CATHETERIZATION N/A 04/12/2015   Procedure: A/V Shuntogram/Fistulagram;  Surgeon: Algernon Huxley, MD;  Location: [facility];  Service: Cardiovascular;  Laterality: N/A;  . PERIPHERAL VASCULAR CATHETERIZATION N/A 04/12/2015   Procedure: A/V Shunt Intervention;  Surgeon: Algernon Huxley, MD;  Location: [facility];  Service: Cardiovascular;  Laterality: N/A;  . PERIPHERAL VASCULAR CATHETERIZATION N/A 06/09/2015   Procedure: Dialysis/Perma Catheter Removal;  Surgeon: Katha Cabal, MD;  Location: [facility];  Service: Cardiovascular;   Laterality: N/A;    There were no vitals filed for this visit.      Subjective Assessment - 11/16/16 1618    Subjective Patient reports having a busy day and having to walk into hospital today and she is tired this afternoon. She was able to walk into movie theater over the weekend well and only  had difficulty with climbing down stairs in the theater.    Limitations House hold activities;Walking;Standing   Patient Stated Goals be able to walk further for community ambulation; shopping   Currently in Pain? No/denies      Objective: Gait: ambulating without AD, slow cadence Strength: both LE's decreased knee extension strength 4-/5, knee flexion 3+/5  Treatment:  Therapeutic exercise: patient performed with VC, tactile cues and demonstration of therapist:  Sitting:  Rocker board for DF/PF x 2 min and side to side x 2 min. Knee extension 2# 2 x 15 reps Knee extension sitting 2# weights on ankles 1 x 15 reps Knee flexion red resistive band 1 x 20 reps Hip adduction with ball and glute sets x 15 reps Hip abduction with resistive band x 10 reps with guidance and manual resistance at end range hold 5 seconds Sit to stand from elevated surface onto balance pad 3 x 5 reps 5x sit to stand 12 seconds without use of hands  Standing: Step ups onto balance beam leading with each LE x 10 reps  Lateral step ups each LE x 10 Side stepping with red resistive band around thighs x 15 reps each direction (3 steps x 5 reps)  NuStep (no charge) x 8 min. At end of session for ROM/endurance level #2 workload (averaged 26 SPM)  Patient response to treatment: Patient able to perform all exercises with improved technique and minimal VC. Mild fatigue noted with standing exercises.          PT Education - 11/16/16 1750    Education provided Yes   Education Details HEP: re assessed exercises   Person(s) Educated Patient   Methods Explanation;Demonstration;Verbal cues   Comprehension Verbalized  understanding;Returned demonstration;Verbal cues required             PT Long Term Goals - 10/23/16 1055      PT LONG TERM GOAL #1   Title Patient will demonstrate improved function with daily tasks at home and in community with LEFS score of 40/80 or better by 12/04/2016   Baseline LEFS 27/80   Status New     PT LONG TERM GOAL #2   Title Patient with demonstrate improved functional strength to demonstrate improved LE function with transfers, walking as indicated by 5x sit to stand in < 13 seconds by 12/04/2016   Baseline 16.5 seconds (norm = 12.6 for age group)   Status New     PT LONG TERM GOAL #3   Title patient will demonstrate improved community ambulation ability as indicated by 10MW of 77m/s or better by 12/04/2016   Baseline current: 0.35m/s   Status New     PT LONG TERM GOAL #4   Title Patient will be independent with home program to continue with strength, balance/endurance by discharge to continue self management by 12/04/2016   Baseline limited knowledge of appropriate exercises and progression to improve function   Status New               Plan - 11/16/16 2006    Clinical Impression Statement Patient improving with strength and endurance as demonstrated by ability to perform exercises with minimal rest periods and with good techniques. mild fatigue noted at end of session.    PT Frequency 2x / week   PT Duration 6 weeks   PT Treatment/Interventions Patient/family education;Electrical Stimulation;Cryotherapy;Moist Heat;Neuromuscular re-education;Therapeutic exercise;Manual techniques   PT Next Visit Plan therapeutic exercise   PT Home Exercise Plan LE exercises, core exercises      Patient will benefit from skilled therapeutic intervention in order to improve the following deficits and impairments:  Decreased strength, Impaired perceived functional ability, Decreased activity tolerance, Decreased endurance, Difficulty walking  Visit Diagnosis: Difficulty in  walking, not elsewhere classified  Muscle weakness (generalized)     Problem List Patient Active Problem List   Diagnosis Date Noted  . Chest discomfort 08/06/2015  . Hyperlipidemia 08/06/2015  . Complication from renal dialysis device 04/12/2015  . SOB (shortness of breath) 02/01/2015  . End stage renal disease ([place]) 02/01/2015  . 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 Type 2 diabetes mellitus with other specified complication ([place]) 34/74/2595  . Asthma 02/01/2015  . Chronic diastolic CHF (congestive heart failure) ([place]) 02/01/2015    Jomarie Longs PT 11/17/2016, 10:57 AM  [facility] [address], [place], [address] Phone: [phone]   Fax:  [phone]  Name: Phyllis [name] MRN: [id number] Date of Birth: Apr 07, 1946

## 2016-11-23 ENCOUNTER — Ambulatory Visit: Payer: Medicare Other | Admitting: Physical Therapy

## 2016-11-30 ENCOUNTER — Ambulatory Visit: Payer: PRIVATE HEALTH INSURANCE | Admitting: Cardiovascular Disease

## 2016-12-08 ENCOUNTER — Ambulatory Visit: Payer: PRIVATE HEALTH INSURANCE | Admitting: Cardiovascular Disease

## 2016-12-14 ENCOUNTER — Other Ambulatory Visit (INDEPENDENT_AMBULATORY_CARE_PROVIDER_SITE_OTHER): Payer: Self-pay | Admitting: Vascular Surgery

## 2016-12-14 DIAGNOSIS — T829XXD Unspecified complication of cardiac and vascular prosthetic device, implant and graft, subsequent encounter: Secondary | ICD-10-CM

## 2016-12-15 ENCOUNTER — Ambulatory Visit (INDEPENDENT_AMBULATORY_CARE_PROVIDER_SITE_OTHER): Payer: Medicare Other | Admitting: Vascular Surgery

## 2016-12-15 ENCOUNTER — Ambulatory Visit (INDEPENDENT_AMBULATORY_CARE_PROVIDER_SITE_OTHER): Payer: Medicare Other

## 2016-12-15 ENCOUNTER — Encounter (INDEPENDENT_AMBULATORY_CARE_PROVIDER_SITE_OTHER): Payer: Self-pay | Admitting: Vascular Surgery

## 2016-12-15 VITALS — BP 185/77 | HR 71 | Resp 16 | Ht <= 58 in | Wt 144.0 lb

## 2016-12-15 DIAGNOSIS — E785 Hyperlipidemia, unspecified: Secondary | ICD-10-CM

## 2016-12-15 DIAGNOSIS — T829XXD Unspecified complication of cardiac and vascular prosthetic device, implant and graft, subsequent encounter: Secondary | ICD-10-CM | POA: Diagnosis not present

## 2016-12-15 DIAGNOSIS — N186 End stage renal disease: Secondary | ICD-10-CM | POA: Diagnosis not present

## 2016-12-15 DIAGNOSIS — E1169 Type 2 diabetes mellitus with other specified complication: Secondary | ICD-10-CM | POA: Diagnosis not present

## 2016-12-15 NOTE — Assessment & Plan Note (Signed)
blood glucose control important in reducing the progression of atherosclerotic disease. Also, involved in wound healing. On appropriate medications.  

## 2016-12-15 NOTE — Assessment & Plan Note (Signed)
lipid control important in reducing the progression of atherosclerotic disease. Continue statin therapy  

## 2016-12-15 NOTE — Progress Notes (Signed)
MRN : [id number]  Phyllis [name] is a 71 y.o. ([birth year]) female who presents with chief complaint of  Chief Complaint  Patient presents with  . Re-evaluation    6 month Ultrasound follow up  .  History of Present Illness: Patient returns today in follow up of AV fistula on the left arm being used for her dialysis treatments. She says it is working well without any major problems. The access is working well without any current issues with difficult access, prolonged bleeding or diminished flow. Duplex today shows some moderately elevated velocities with some narrowing in the perianastomotic cephalic vein with an otherwise patent left radiocephalic AV fistula.  Current Outpatient Prescriptions  Medication Sig Dispense Refill  . allopurinol (ZYLOPRIM) 100 MG tablet Take 100 mg by mouth 2 (two) times daily.     Marland Kitchen amLODipine (NORVASC) 10 MG tablet Take 10 mg by mouth.     . budesonide-formoterol (SYMBICORT) 160-4.5 MCG/ACT inhaler Inhale 2 puffs into the lungs 2 (two) times daily.    . calcium acetate, Phos Binder, (PHOSLYRA) 667 MG/5ML SOLN Take by mouth 3 (three) times daily with meals.    . carvedilol (COREG) 12.5 MG tablet Take 12.5 mg by mouth 2 (two) times daily with a meal.     . cetirizine (ZYRTEC) 10 MG tablet Take 10 mg by mouth as needed for allergies.    . cyclobenzaprine (FLEXERIL) 10 MG tablet Take 10 mg by mouth 3 (three) times daily as needed for muscle spasms.    Marland Kitchen docusate sodium (COLACE) 100 MG capsule Take 100 mg by mouth 3 (three) times daily as needed for mild constipation.    Marland Kitchen HYDROcodone-acetaminophen (NORCO) 7.5-325 MG per tablet 1 tablet every 4 (four) hours as needed.     Marland Kitchen ipratropium-albuterol (DUONEB) 0.5-2.5 (3) MG/3ML SOLN Take 3 mLs by nebulization.    . mometasone (NASONEX) 50 MCG/ACT nasal spray Place 2 sprays into the nose daily.    Marland Kitchen omeprazole (PRILOSEC) 20 MG capsule Take 20 mg by mouth 2 (two) times daily before a meal.    . pravastatin  (PRAVACHOL) 40 MG tablet Take 40 mg by mouth daily.     Marland Kitchen senna (SENOKOT) 8.6 MG tablet Take 1 tablet by mouth daily.    Marland Kitchen topiramate (TOPAMAX) 25 MG tablet Take 25 mg by mouth as needed.     . VESICARE 5 MG tablet Take 5 mg by mouth daily.     . Vitamin D, Ergocalciferol, (DRISDOL) 50000 UNITS CAPS capsule Take 50,000 Units by mouth every 7 (seven) days.      No current facility-administered medications for this visit.     Past Medical History:  Diagnosis Date  . Allergy   . Asthma   . Chronic kidney disease   . COPD (chronic obstructive pulmonary disease) ([place])   . Diabetes mellitus without complication ([place])   . Dialysis patient ([place])    3 days every week.   . Dialysis patient ([place])   . Fistula    lower left arm  . GERD (gastroesophageal reflux disease)   . Gout   . Hypertension   . 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 Sleep apnea     Past Surgical History:  Procedure Laterality Date  . carpel tunnel    . GALLBLADDER SURGERY    . PERIPHERAL VASCULAR CATHETERIZATION N/A 04/12/2015   Procedure: A/V Shuntogram/Fistulagram;  Surgeon: Algernon Huxley, MD;  Location: [facility];  Service: Cardiovascular;  Laterality: N/A;  . PERIPHERAL VASCULAR CATHETERIZATION N/A 04/12/2015   Procedure: A/V Shunt Intervention;  Surgeon: Algernon Huxley, MD;  Location: [facility];  Service: Cardiovascular;  Laterality: N/A;  . PERIPHERAL VASCULAR CATHETERIZATION N/A 06/09/2015   Procedure: Dialysis/Perma Catheter Removal;  Surgeon: Katha Cabal, MD;  Location: [facility];  Service: Cardiovascular;  Laterality: N/A;    Social History Social History  Substance Use Topics  . Smoking status: Never Smoker  . Smokeless tobacco: Never Used  . Alcohol use No    Family History Family History  Problem Relation Age of Onset  . Hypertension Mother   . Hyperlipidemia Mother   . Heart disease Father   . Heart attack Father 55  . Hypertension  Father   . Hyperlipidemia Father   . Heart disease Brother     CABG   . Heart attack Brother   . Breast cancer Neg Hx     Allergies  Allergen Reactions  . Codeine Nausea And Vomiting and Nausea Only  . Nitrofurantoin Swelling and Rash    Other Reaction: swelling of body  . Sulfamethoxazole-Trimethoprim Swelling  . Vicodin [Hydrocodone-Acetaminophen] Nausea And Vomiting and Nausea Only  . 2,4-D Dimethylamine (Amisol) Rash    Other Reaction: h/a  . Baclofen Other (See Comments) and Nausea Only    lightheadness ,drowsiness , muscle weakness , twitching in hands   . Neosporin [Neomycin-Bacitracin Zn-Polymyx] Other (See Comments) and Rash    Other Reaction: irritation Skin irritation   . Quinine Rash    Other Reaction: Vomiting rash, h/a, vision  . Ultram [Tramadol] Palpitations  . Zocor [Simvastatin] Other (See Comments) and Rash    Other Reaction: muscle spasms Muscle pain and spasms  . Bactrim [Sulfamethoxazole-Trimethoprim] Swelling  . Macrodantin [Nitrofurantoin Macrocrystal] Swelling  . Quinine Derivatives Other (See Comments)    Vertigo,nausea vomiting blurred vision headache ears sensitivity   . Vasotec [Enalapril] Other (See Comments)    Headaches      REVIEW OF SYSTEMS (Negative unless checked)  Constitutional: [] Weight loss  [] Fever  [] Chills Cardiac: [] Chest pain   [] Chest pressure   [] Palpitations   [] Shortness of breath when laying flat   [] Shortness of breath at rest   [] Shortness of breath with exertion. Vascular:  [] Pain in legs with walking   [] Pain in legs at rest   [] Pain in legs when laying flat   [] Claudication   [] Pain in feet when walking  [] Pain in feet at rest  [] Pain in feet when laying flat   [] History of DVT   [] Phlebitis   [x] Swelling in legs   [] Varicose veins   [] Non-healing ulcers Pulmonary:   [] Uses home oxygen   [] Productive cough   [] Hemoptysis   [] Wheeze  [] COPD   [] Asthma Neurologic:  [] Dizziness  [] Blackouts   [] Seizures   [] History of  stroke   [] History of TIA  [] Aphasia   [] Temporary blindness   [] Dysphagia   [] Weakness or numbness in arms   [] Weakness or numbness in legs Musculoskeletal:  [] Arthritis   [] Joint swelling   [] Joint pain   [] Low back pain Hematologic:  [] Easy bruising  [] Easy bleeding   [] Hypercoagulable state   [] Anemic   Gastrointestinal:  [] Blood in stool   [] Vomiting blood  [] Gastroesophageal reflux/heartburn   [] Abdominal pain Genitourinary:  [x] Chronic kidney disease   [] Difficult urination  [] Frequent urination  [] Burning with urination   [] Hematuria Skin:  [] Rashes   [] Ulcers   [] Wounds Psychological:  [] History of  anxiety   []  History of major depression.  Physical Examination  BP (!) 185/77 (BP Location: Right Arm)   Pulse 71   Resp 16   Ht 4\' 10"  (1.473 m)   Wt 65.3 kg (144 lb)   BMI 30.10 kg/m  Gen:  WD/WN, NAD Head: [place]/AT, No temporalis wasting. Ear/Nose/Throat: Hearing grossly intact, nares w/o erythema or drainage, trachea midline Eyes: Conjunctiva clear. Sclera non-icteric Neck: Supple.  No JVD.  Pulmonary:  Good air movement, no use of accessory muscles.  Cardiac: RRR, normal S1, S2 Vascular: thrill present in left radiocephalic AVF Vessel Right Left  Radial Palpable Palpable                                   Gastrointestinal: soft, non-tender/non-distended. No guarding/reflex.  Musculoskeletal: M/S 5/5 throughout.  No deformity or atrophy.  Neurologic: Sensation grossly intact in extremities.  Symmetrical.  Speech is fluent.  Psychiatric: Judgment intact, Mood & affect appropriate for pt's clinical situation. Dermatologic: No rashes or ulcers noted.  No cellulitis or open wounds. Lymph : No Cervical, Axillary, or Inguinal lymphadenopathy.      Labs No results found for this or any previous visit (from the past 2160 hour(s)).  Radiology No results found.    Assessment/Plan  Hyperlipidemia lipid control important in reducing the progression of atherosclerotic  disease. Continue statin therapy   Type 2 diabetes mellitus with other specified complication blood glucose control important in reducing the progression of atherosclerotic disease. Also, involved in wound healing. On appropriate medications.   End stage renal disease Duplex today shows some moderately elevated velocities with some narrowing in the perianastomotic cephalic vein with an otherwise patent left radiocephalic AV fistula. As the fistula is currently working well, I do not think this will require any intervention at this point. I would recommend evaluation with a duplex in about 6 months for further evaluation of the narrowing to recheck it status. If she has any difficulties with the access in the interim, she will contact our office.    Leotis Pain, MD  12/15/2016 4:46 PM    This note was created with Dragon medical transcription system.  Any errors from dictation are purely unintentional

## 2016-12-15 NOTE — Assessment & Plan Note (Signed)
Duplex today shows some moderately elevated velocities with some narrowing in the perianastomotic cephalic vein with an otherwise patent left radiocephalic AV fistula. As the fistula is currently working well, I do not think this will require any intervention at this point. I would recommend evaluation with a duplex in about 6 months for further evaluation of the narrowing to recheck it status. If she has any difficulties with the access in the interim, she will contact our office.

## 2016-12-22 ENCOUNTER — Ambulatory Visit (INDEPENDENT_AMBULATORY_CARE_PROVIDER_SITE_OTHER): Payer: Medicare Other | Admitting: Cardiovascular Disease

## 2016-12-22 ENCOUNTER — Encounter: Payer: Self-pay | Admitting: Cardiovascular Disease

## 2016-12-22 VITALS — BP 170/84 | HR 85 | Ht <= 58 in | Wt 141.0 lb

## 2016-12-22 DIAGNOSIS — R0602 Shortness of breath: Secondary | ICD-10-CM | POA: Diagnosis not present

## 2016-12-22 DIAGNOSIS — E785 Hyperlipidemia, unspecified: Secondary | ICD-10-CM

## 2016-12-22 DIAGNOSIS — I5032 Chronic diastolic (congestive) heart failure: Secondary | ICD-10-CM | POA: Diagnosis not present

## 2016-12-22 DIAGNOSIS — Z992 Dependence on renal dialysis: Secondary | ICD-10-CM

## 2016-12-22 DIAGNOSIS — N186 End stage renal disease: Secondary | ICD-10-CM | POA: Diagnosis not present

## 2016-12-22 DIAGNOSIS — E1169 Type 2 diabetes mellitus with other specified complication: Secondary | ICD-10-CM

## 2016-12-22 NOTE — Progress Notes (Signed)
Cardiology Office Note  Date:  12/22/2016   ID:  RHODA WALDVOGEL, DOB 08/16/1946, MRN [id number]  PCP:  [name], MD   Chief Complaint  Patient presents with  . other     OD 75mo f/u. Pt states she had stress test at [facility] 3-4 months ago and would like Dr.Delmer Kowalski to review. Pt worried that they're trying to knock her off of kidney transplant list due to results. Pt also c/o diarrhea the past couple of days. Reviewed meds with pt verbally.    HPI:  Ms. Abagael Kramm is a very pleasant 71 year old woman with history of chronic renal failure, on hemodialysis 2 days per week Tuesday and Saturday, diffuse aortic atherosclerosis seen on CT scan 2014, obesity, hypertension, hyperlipidemia who presents for follow-up of her hypertension, atherosclerosis.  Recent stress Myoview done October 2017 showing no ischemia, significant GI uptake artifact  She reports that she had recent exercise stress test at [facility] 09/2016 She exercises on a bike for only 3 minutes,Mets of 2.28, no incline. 48% of VO2.   knee pain and leg fatiugue   She reports that based on this study, they are going to take her off the transplant list  At baseline she does do any exercise, is very deconditioned Despite this, she reports that she feels well has no complaints Blood pressure running high today, only took her medications before she came to her visit today Typically blood pressure at home runs 297 up to 989 systolic over 80  Previous CT abd in 2014: Reviewed with her Diffuse athero in aorta  Has HD satuday, Tuesday Tuesday they pulled 1.6 L  No chest discomfort, denies any significant change in her breathing  hemoglobin A1c typically in the 5 range  EKG on today's visit shows normal sinus rhythm with rate 85 bpm, no significant ST or T-wave changes  Other past medical history reviewed  essentially normal echocardiogram and stress test March 2015,  admission to the hospital 12/04/2014 for  acute respiratory failure with hypoxia, found to have acute on chronic renal failure, 25 pound weight gain  AV fistula was not working on the left and temporary dialysis catheter was placed and dialysis performed 3 in the hospital.  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  PSH:    Past Surgical History:  Procedure Laterality Date  . carpel tunnel    . GALLBLADDER SURGERY    . PERIPHERAL VASCULAR CATHETERIZATION N/A 04/12/2015   Procedure: A/V Shuntogram/Fistulagram;  Surgeon: Algernon Huxley, MD;  Location: [place] [facility];  Service: Cardiovascular;  Laterality: N/A;  . PERIPHERAL VASCULAR CATHETERIZATION N/A 04/12/2015   Procedure: A/V Shunt Intervention;  Surgeon: Algernon Huxley, MD;  Location: [place] [facility];  Service: Cardiovascular;  Laterality: N/A;  . PERIPHERAL VASCULAR CATHETERIZATION N/A 06/09/2015   Procedure: Dialysis/Perma Catheter Removal;  Surgeon: Katha Cabal, MD;  Location: [place] [facility];  Service: Cardiovascular;  Laterality: N/A;    Current Outpatient Prescriptions  Medication Sig Dispense Refill  . allopurinol (ZYLOPRIM) 100 MG tablet Take 100 mg by mouth 2 (two) times daily.     Marland Kitchen amLODipine (NORVASC) 10 MG tablet Take  10 mg by mouth.     . calcium acetate, Phos Binder, (PHOSLYRA) 667 MG/5ML SOLN Take by mouth 3 (three) times daily with meals.    . carvedilol (COREG) 12.5 MG tablet Take 12.5 mg by mouth 2 (two) times daily with a meal.     . cetirizine (ZYRTEC) 10 MG tablet Take 10 mg by mouth as needed for allergies.     . cyclobenzaprine (FLEXERIL) 10 MG tablet Take 10 mg by mouth 3 (three) times daily as needed for muscle spasms.    Marland Kitchen docusate sodium (COLACE) 100 MG capsule Take 100 mg by mouth 3 (three) times daily as needed for mild constipation.    Marland Kitchen HYDROcodone-acetaminophen (NORCO) 7.5-325 MG per tablet 1 tablet 3 (three) times daily.     Marland Kitchen ipratropium-albuterol (DUONEB) 0.5-2.5 (3) MG/3ML SOLN Take 3 mLs by nebulization.    . mometasone (NASONEX) 50 MCG/ACT nasal spray Place 2 sprays into the nose daily.    Marland Kitchen omeprazole (PRILOSEC) 20 MG capsule Take 20 mg by mouth 2 (two) times daily before a meal.    . pravastatin (PRAVACHOL) 40 MG tablet Take 40 mg by mouth daily.     Marland Kitchen senna (SENOKOT) 8.6 MG tablet Take 1 tablet by mouth daily.    Marland Kitchen topiramate (TOPAMAX) 25 MG tablet Take 25 mg by mouth as needed.     . VESICARE 5 MG tablet Take 5 mg by mouth daily.     . Vitamin D, Ergocalciferol, (DRISDOL) 50000 UNITS CAPS capsule Take 50,000 Units by mouth every 7 (seven) days.      No current facility-administered medications for this visit.      Allergies:   Codeine; Nitrofurantoin; Sulfamethoxazole-trimethoprim; Vicodin [hydrocodone-acetaminophen]; 2,4-d dimethylamine (amisol); Baclofen; Neosporin [neomycin-bacitracin zn-polymyx]; Quinine; Ultram [tramadol]; Zocor [simvastatin]; Bactrim [sulfamethoxazole-trimethoprim]; Macrodantin [nitrofurantoin macrocrystal]; Quinine derivatives; and Vasotec [enalapril]   Social History:  The patient  reports that she has never smoked. She has never used smokeless tobacco. She reports that she does not drink alcohol or use drugs.   Family History:   family history includes Heart attack in her brother; Heart attack (age of onset: 76) in her father; Heart disease in her brother and father; Hyperlipidemia in her father and mother; Hypertension in her father and mother.    Review of Systems: Review of Systems  Constitutional: Negative.   Respiratory: Negative.    Cardiovascular: Negative.   Gastrointestinal: Negative.   Musculoskeletal: Negative.   Neurological: Negative.   Psychiatric/Behavioral: Negative.   All other systems reviewed and are negative.    PHYSICAL EXAM: VS:  BP (!) 170/84 (BP Location: Left Arm, Patient Position: Sitting, Cuff Size: Normal)   Pulse 85   Ht 4\' 10"  (1.473 m)   Wt 141 lb (64 kg)   BMI 29.47 kg/m  , BMI Body mass index is 29.47 kg/m. GEN: Well nourished, well developed, in no acute distress, obese  HEENT: normal  Neck: no JVD, carotid bruits, or masses Cardiac: RRR; no murmurs, rubs, or gallops,no edema  Respiratory:  clear to auscultation bilaterally, normal work of breathing GI: soft, nontender, nondistended, + BS MS: no deformity or atrophy  Skin: warm and dry, no rash Neuro:  Strength and sensation are intact Psych: euthymic mood, full affect    Recent Labs: 08/08/2016: Potassium 2.6    Lipid Panel No results found for: CHOL, HDL, LDLCALC, TRIG    Wt Readings from Last 3 Encounters:  12/22/16 141 lb (64 kg)  12/15/16 144 lb (65.3 kg)  08/06/15  144 lb 12 oz (65.7 kg)       ASSESSMENT AND PLAN:  Chronic diastolic CHF (congestive heart failure) ([place]) - Plan: EKG 12-Lead Appears relatively euvolemic on today's visit, dialysis 2 days per week Fluid status managed by hemodialysis.   Hyperlipidemia, unspecified hyperlipidemia type - Plan: EKG 12-Lead No recent lipid panel available that we can find Recommended she discuss this with primary care, certainly we could do this in our office if needed. Goal LDL less than 70 given aortic atherosclerosis  SOB (shortness of breath) - Plan: EKG 12-Lead Recommended she start a regular exercise or walking program Recent exercise stress study at [facility] shows she is very deconditioned. 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 EKG 12-Lead     Signed, Esmond Plants, M.D., Ph.D. 12/22/2016  [place], [place]

## 2016-12-22 NOTE — Patient Instructions (Signed)

## 2017-05-07 ENCOUNTER — Encounter: Payer: Self-pay | Admitting: Podiatry

## 2017-05-07 ENCOUNTER — Ambulatory Visit (INDEPENDENT_AMBULATORY_CARE_PROVIDER_SITE_OTHER): Payer: Medicare Other | Admitting: Podiatry

## 2017-05-07 DIAGNOSIS — M722 Plantar fascial fibromatosis: Secondary | ICD-10-CM

## 2017-05-07 DIAGNOSIS — E1142 Type 2 diabetes mellitus with diabetic polyneuropathy: Secondary | ICD-10-CM

## 2017-05-07 DIAGNOSIS — B351 Tinea unguium: Secondary | ICD-10-CM

## 2017-05-07 DIAGNOSIS — M79676 Pain in unspecified toe(s): Secondary | ICD-10-CM

## 2017-05-07 NOTE — Progress Notes (Signed)
She presents today states that she needs an injection in both heels also like to have her toenails trimmed those are long and painful.  Objective: Vital signs are stable she is alert and oriented 3 pulses remain palpable. She has pain on palpation medial calcaneal tubercles bilateral. Her toenails are long thick yellow dystrophic and clinically mycotic.  Assessment: Pain and limb secondary to onychomycosis diabetes mellitus with diabetic peripheral neuropathy and plantar fasciitis.  Plan: Injected the bilateral heels today and debrided toenails 1 through 5 bilateral.

## 2017-05-31 ENCOUNTER — Other Ambulatory Visit: Payer: Self-pay | Admitting: Internal Medicine

## 2017-05-31 DIAGNOSIS — M545 Low back pain: Secondary | ICD-10-CM

## 2017-06-07 ENCOUNTER — Ambulatory Visit
Admission: RE | Admit: 2017-06-07 | Discharge: 2017-06-07 | Disposition: A | Discharge status: Home / Self Care | Payer: Medicare Other | Source: Ambulatory Visit | Attending: Internal Medicine | Admitting: Internal Medicine

## 2017-06-07 DIAGNOSIS — R29898 Other symptoms and signs involving the musculoskeletal system: Secondary | ICD-10-CM | POA: Diagnosis present

## 2017-06-07 DIAGNOSIS — G8929 Other chronic pain: Secondary | ICD-10-CM | POA: Diagnosis present

## 2017-06-07 DIAGNOSIS — M545 Low back pain: Secondary | ICD-10-CM

## 2017-06-07 DIAGNOSIS — M47816 Spondylosis without myelopathy or radiculopathy, lumbar region: Secondary | ICD-10-CM | POA: Insufficient documentation

## 2017-06-22 ENCOUNTER — Encounter (INDEPENDENT_AMBULATORY_CARE_PROVIDER_SITE_OTHER): Payer: Self-pay | Admitting: Vascular Surgery

## 2017-06-22 ENCOUNTER — Ambulatory Visit (INDEPENDENT_AMBULATORY_CARE_PROVIDER_SITE_OTHER): Payer: Medicare Other | Admitting: Vascular Surgery

## 2017-06-22 ENCOUNTER — Ambulatory Visit (INDEPENDENT_AMBULATORY_CARE_PROVIDER_SITE_OTHER): Payer: Medicare Other

## 2017-06-22 VITALS — BP 181/73 | HR 74 | Resp 15 | Ht <= 58 in | Wt 141.0 lb

## 2017-06-22 DIAGNOSIS — E1169 Type 2 diabetes mellitus with other specified complication: Secondary | ICD-10-CM | POA: Diagnosis not present

## 2017-06-22 DIAGNOSIS — E785 Hyperlipidemia, unspecified: Secondary | ICD-10-CM

## 2017-06-22 DIAGNOSIS — I5032 Chronic diastolic (congestive) heart failure: Secondary | ICD-10-CM | POA: Diagnosis not present

## 2017-06-22 DIAGNOSIS — N186 End stage renal disease: Secondary | ICD-10-CM | POA: Diagnosis not present

## 2017-06-22 DIAGNOSIS — T829XXD Unspecified complication of cardiac and vascular prosthetic device, implant and graft, subsequent encounter: Secondary | ICD-10-CM | POA: Diagnosis not present

## 2017-06-22 NOTE — Progress Notes (Signed)
MRN : [id number]  Phyllis [name] is a 71 y.o. ([birth year]) female who presents with chief complaint of  Chief Complaint  Patient presents with  . Re-evaluation    6 month HDA follow up  .  History of Present Illness: Patient returns today in follow up of her AVF and ESRD. She has a left radiocephalic AV fistula about 63-21 years old and has not had an intervention in a couple of years. She says it is working well on dialysis. She denies any prolonged bleeding, difficulties with access, or diminished flow. Her duplex does show significantly elevated velocities in the perianastomotic regions of the left radiocephalic AV fistula with no other areas of narrowing or stenosis identified.  Current Outpatient Prescriptions  Medication Sig Dispense Refill  . allopurinol (ZYLOPRIM) 100 MG tablet Take 100 mg by mouth 2 (two) times daily.     Marland Kitchen amLODipine (NORVASC) 10 MG tablet Take 10 mg by mouth.     . budesonide-formoterol (SYMBICORT) 160-4.5 MCG/ACT inhaler Inhale 2 puffs into the lungs 2 (two) times daily.    . calcium acetate, Phos Binder, (PHOSLYRA) 667 MG/5ML SOLN Take by mouth 3 (three) times daily with meals.    . carvedilol (COREG) 12.5 MG tablet Take 12.5 mg by mouth 2 (two) times daily with a meal.     . cetirizine (ZYRTEC) 10 MG tablet Take 10 mg by mouth as needed for allergies.    . cyclobenzaprine (FLEXERIL) 10 MG tablet Take 10 mg by mouth 3 (three) times daily as needed for muscle spasms.    Marland Kitchen docusate sodium (COLACE) 100 MG capsule Take 100 mg by mouth 3 (three) times daily as needed for mild constipation.    Marland Kitchen HYDROcodone-acetaminophen (NORCO) 7.5-325 MG per tablet 1 tablet every 4 (four) hours as needed.     Marland Kitchen ipratropium-albuterol (DUONEB) 0.5-2.5 (3) MG/3ML SOLN Take 3 mLs by nebulization.    . mometasone (NASONEX) 50 MCG/ACT nasal spray Place 2 sprays into the nose daily.    Marland Kitchen omeprazole (PRILOSEC) 20 MG capsule Take 20 mg by mouth 2 (two) times daily  before a meal.    . pravastatin (PRAVACHOL) 40 MG tablet Take 40 mg by mouth daily.     Marland Kitchen senna (SENOKOT) 8.6 MG tablet Take 1 tablet by mouth daily.    Marland Kitchen topiramate (TOPAMAX) 25 MG tablet Take 25 mg by mouth as needed.     . VESICARE 5 MG tablet Take 5 mg by mouth daily.     . Vitamin D, Ergocalciferol, (DRISDOL) 50000 UNITS CAPS capsule Take 50,000 Units by mouth every 7 (seven) days.      No current facility-administered medications for this visit.         Past Medical History:  Diagnosis Date  . Allergy   . Asthma   . Chronic kidney disease   . COPD (chronic obstructive pulmonary disease) ([place])   . Diabetes mellitus without complication ([place])   . Dialysis patient ([place])    3 days every week.   . Dialysis patient ([place])   . Fistula    lower left arm  . GERD (gastroesophageal reflux disease)   . Gout   . Hypertension   . 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 Sleep apnea          Past Surgical History:  Procedure Laterality Date  . carpel tunnel    . GALLBLADDER SURGERY    . PERIPHERAL VASCULAR CATHETERIZATION N/A  04/12/2015   Procedure: A/V Shuntogram/Fistulagram;  Surgeon: Algernon Huxley, MD;  Location: [place] [facility];  Service: Cardiovascular;  Laterality: N/A;  . PERIPHERAL VASCULAR CATHETERIZATION N/A 04/12/2015   Procedure: A/V Shunt Intervention;  Surgeon: Algernon Huxley, MD;  Location: [place] [facility];  Service: Cardiovascular;  Laterality: N/A;  . PERIPHERAL VASCULAR CATHETERIZATION N/A 06/09/2015   Procedure: Dialysis/Perma Catheter Removal;  Surgeon: Katha Cabal, MD;  Location: [place] [facility];  Service: Cardiovascular;  Laterality: N/A;    Social History     Social History  Substance Use Topics  . Smoking status: Never Smoker  . Smokeless tobacco: Never Used  . Alcohol use No    Family History       Family History  Problem Relation Age of Onset  . Hypertension Mother     . Hyperlipidemia Mother   . Heart disease Father   . Heart attack Father 28  . Hypertension Father   . Hyperlipidemia Father   . Heart disease Brother     CABG   . Heart attack Brother   . Breast cancer Neg Hx          Allergies  Allergen Reactions  . Codeine Nausea And Vomiting and Nausea Only  . Nitrofurantoin Swelling and Rash    Other Reaction: swelling of body  . Sulfamethoxazole-Trimethoprim Swelling  . Vicodin [Hydrocodone-Acetaminophen] Nausea And Vomiting and Nausea Only  . 2,4-D Dimethylamine (Amisol) Rash    Other Reaction: h/a  . Baclofen Other (See Comments) and Nausea Only    lightheadness ,drowsiness , muscle weakness , twitching in hands   . Neosporin [Neomycin-Bacitracin Zn-Polymyx] Other (See Comments) and Rash    Other Reaction: irritation Skin irritation   . Quinine Rash    Other Reaction: Vomiting rash, h/a, vision  . Ultram [Tramadol] Palpitations  . Zocor [Simvastatin] Other (See Comments) and Rash    Other Reaction: muscle spasms Muscle pain and spasms  . Bactrim [Sulfamethoxazole-Trimethoprim] Swelling  . Macrodantin [Nitrofurantoin Macrocrystal] Swelling  . Quinine Derivatives Other (See Comments)    Vertigo,nausea vomiting blurred vision headache ears sensitivity   . Vasotec [Enalapril] Other (See Comments)    Headaches      REVIEW OF SYSTEMS (Negative unless checked)  Constitutional: [] Weight loss  [] Fever  [] Chills Cardiac: [] Chest pain   [] Chest pressure   [] Palpitations   [] Shortness of breath when laying flat   [] Shortness of breath at rest   [] Shortness of breath with exertion. Vascular:  [] Pain in legs with walking   [] Pain in legs at rest   [] Pain in legs when laying flat   [] Claudication   [] Pain in feet when walking  [] Pain in feet at rest  [] Pain in feet when laying flat   [] History of DVT   [] Phlebitis   [x] Swelling in legs   [] Varicose veins   [] Non-healing ulcers Pulmonary:   [] Uses home  oxygen   [] Productive cough   [] Hemoptysis   [] Wheeze  [] COPD   [] Asthma Neurologic:  [] Dizziness  [] Blackouts   [] Seizures   [] History of stroke   [] History of TIA  [] Aphasia   [] Temporary blindness   [] Dysphagia   [] Weakness or numbness in arms   [] Weakness or numbness in legs Musculoskeletal:  [] Arthritis   [] Joint swelling   [] Joint pain   [] Low back pain Hematologic:  [] Easy bruising  [] Easy bleeding   [] Hypercoagulable state   [] Anemic   Gastrointestinal:  [] Blood in stool   [] Vomiting blood  [] Gastroesophageal reflux/heartburn   []   Abdominal pain Genitourinary:  [x] Chronic kidney disease   [] Difficult urination  [] Frequent urination  [] Burning with urination   [] Hematuria Skin:  [] Rashes   [] Ulcers   [] Wounds Psychological:  [] History of anxiety   []  History of major depression.   Physical Examination  BP (!) 181/73 (BP Location: Right Arm)   Pulse 74   Resp 15   Ht 4\' 10"  (1.473 m)   Wt 141 lb (64 kg)   BMI 29.47 kg/m  Gen:  WD/WN, NAD Head: /AT, No temporalis wasting. Ear/Nose/Throat: Hearing grossly intact, nares w/o erythema or drainage, trachea midline Eyes: Conjunctiva clear. Sclera non-icteric Neck: Supple.  No JVD.  Pulmonary:  Good air movement, no use of accessory muscles.  Cardiac: RRR, normal S1, S2 Vascular: good thrill in left radiocephalic AVF Vessel Right Left  Radial Palpable Palpable                                    Musculoskeletal: M/S 5/5 throughout.  No deformity or atrophy.  Neurologic: Sensation grossly intact in extremities.  Symmetrical.  Speech is fluent.  Psychiatric: Judgment intact, Mood & affect appropriate for pt's clinical situation. Dermatologic: No rashes or ulcers noted.  No cellulitis or open wounds.       Labs No results found for this or any previous visit (from the past 2160 hour(s)).  Radiology Mr Lumbar Spine Wo Contrast  Result Date: 06/07/2017 CLINICAL DATA:  Chronic low back pain with onset of lower extremity  weakness in the past 6 months to a year. No known injury. EXAM: MRI LUMBAR SPINE WITHOUT CONTRAST TECHNIQUE: Multiplanar, multisequence MR imaging of the lumbar spine was performed. No intravenous contrast was administered. COMPARISON:  None. FINDINGS: Segmentation:  Standard. Alignment:  Maintained. Vertebrae:  No fracture or worrisome lesion. Conus medullaris: Extends to the L1 level and appears normal. Paraspinal and other soft tissues: The kidneys are atrophic with scattered cysts. Disc levels: T9-10, T10-11 and T11-12 are imaged in the sagittal plane only and negative. T12-L1:  Negative. L1-2:  Negative. L2-3:  Negative. L3-4: Shallow disc bulge without central canal or foraminal stenosis. L4-5: There is ligamentum flavum thickening and a shallow broad-based disc bulge with an annular fissure. Mild to moderate central canal stenosis is present with narrowing in the subarticular recesses. Moderate to moderately severe foraminal narrowing is worse on the right. L5-S1: Right worse than left facet degenerative disease. Very shallow central protrusion is identified without central canal or foraminal stenosis. 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 Moderate to moderately severe foraminal narrowing at this level is worse on the right. Electronically Signed   By: Inge Rise M.D.   On: 06/07/2017 12:29    Assessment/Plan Hyperlipidemia lipid control important in reducing the progression of atherosclerotic disease. Continue statin therapy   Type 2 diabetes mellitus with other specified complication blood glucose control important in reducing the progression of atherosclerotic disease. Also, involved in wound healing. On appropriate medications.  End stage renal disease Her duplex does show significantly elevated velocities in the perianastomotic regions of the left radiocephalic AV fistula  with no other areas of narrowing or stenosis identified. She says the access is working pretty well and as long as the function is good think we can safely follow this area of stenosis without intervention. Should she develop any problems on dialysis,  a fistulogram would clearly be recommended. Otherwise, I will plan to see her back in about 6 months.    Leotis Pain, MD  06/22/2017 5:20 PM    This note was created with Dragon medical transcription system.  Any errors from dictation are purely unintentional

## 2017-06-22 NOTE — Assessment & Plan Note (Signed)
Her duplex does show significantly elevated velocities in the perianastomotic regions of the left radiocephalic AV fistula with no other areas of narrowing or stenosis identified. She says the access is working pretty well and as long as the function is good think we can safely follow this area of stenosis without intervention. Should she develop any problems on dialysis, a fistulogram would clearly be recommended. Otherwise, I will plan to see her back in about 6 months.

## 2017-06-27 ENCOUNTER — Encounter: Payer: Self-pay | Admitting: Physical Therapy

## 2017-06-27 ENCOUNTER — Ambulatory Visit: Payer: Medicare Other | Attending: Internal Medicine | Admitting: Physical Therapy

## 2017-06-27 DIAGNOSIS — M6281 Muscle weakness (generalized): Secondary | ICD-10-CM | POA: Insufficient documentation

## 2017-06-27 DIAGNOSIS — R262 Difficulty in walking, not elsewhere classified: Secondary | ICD-10-CM

## 2017-06-27 DIAGNOSIS — G8929 Other chronic pain: Secondary | ICD-10-CM | POA: Diagnosis present

## 2017-06-27 DIAGNOSIS — M545 Low back pain, unspecified: Secondary | ICD-10-CM

## 2017-06-28 NOTE — Therapy (Signed)
[facility] [address] [place], [address] Phone: [phone]   Fax:  [phone]  Physical Therapy Evaluation  Patient Details  Name: Phyllis [name] MRN: [id number] Date of Birth: Oct 04, 1946 Referring Provider: Azzie Glatter MD  Encounter Date: 06/27/2017      PT End of Session - 06/27/17 1630    Visit Number 1   Number of Visits 8   Date for PT Re-Evaluation 07/25/17   Authorization Type 1   Authorization Time Period 10 (G-code)   PT Start Time 1540   PT Stop Time 1620   PT Time Calculation (min) 40 min   Activity Tolerance Patient tolerated treatment well   Behavior During Therapy [facility] for tasks assessed/performed      Past Medical History:  Diagnosis Date  . Allergy   . Asthma   . Chronic kidney disease   . COPD (chronic obstructive pulmonary disease) ([place])   . Diabetes mellitus without complication ([place])   . Dialysis patient ([place])    3 days every week.   . Dialysis patient ([place])   . Fistula    lower left arm  . GERD (gastroesophageal reflux disease)   . Gout   . Hypertension   . 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 Sleep apnea     Past Surgical History:  Procedure Laterality Date  . carpel tunnel    . GALLBLADDER SURGERY    . PERIPHERAL VASCULAR CATHETERIZATION N/A 04/12/2015   Procedure: A/V Shuntogram/Fistulagram;  Surgeon: Algernon Huxley, MD;  Location: [facility];  Service: Cardiovascular;  Laterality: N/A;  . PERIPHERAL VASCULAR CATHETERIZATION N/A 04/12/2015   Procedure: A/V Shunt Intervention;  Surgeon: Algernon Huxley, MD;  Location: [place];  Service: Cardiovascular;  Laterality: N/A;  . PERIPHERAL VASCULAR CATHETERIZATION N/A 06/09/2015   Procedure: Dialysis/Perma Catheter Removal;  Surgeon: Katha Cabal, MD;  Location: [facility];  Service: Cardiovascular;  Laterality: N/A;    There were no vitals  filed for this visit.       Subjective Assessment - 06/27/17 1611    Subjective Patient reports having pain in mid back between scapulae and pain is worse with using arms such as house cleaning, doing wood working crafts or stained glass.    Pertinent History Pain in her back is chronic and has worsened over the past year. Patient reports history of kidney problems and is on dialysis currently since last year, 2x/week. Plantar fascitis within the past year with injections within the past year. also reports she has had back pain intermittently with activity over the past >10 years.    Limitations House hold activities;Other (comment);Lifting  doing crafts, wood working, stained glass   How long can you sit comfortably? as long as she wants    How long can you stand comfortably? 30 min or less   How long can you walk comfortably? unable to walk long distances   Diagnostic tests MRI   Patient Stated Goals less back pain to allow her to do house keeping chores and do wood working, stained glass with less back pain   Currently in Pain? Yes   Pain Score 3    Pain Location Back   Pain Orientation Mid;Right   Pain Descriptors / Indicators Aching;Burning;Sore   Pain Type Chronic pain   Pain Onset More than a month ago   Pain Frequency Constant   Aggravating  Factors  using UE's for daily chores, crafts   Pain Relieving Factors rest: no pain on waking, pain increases at the day progresses and she uses her UE's   Effect of Pain on Daily Activities limits abiltiy to use UE's for tasks            [facility] PT Assessment - 06/27/17 1757      Assessment   Medical Diagnosis Chronic midline low back pain without sciatica,   Referring Provider Azzie Glatter MD   Onset Date/Surgical Date 11/21/15   Hand Dominance Right   Next MD Visit none   Prior Therapy yes     Precautions   Precautions None     Restrictions   Weight Bearing Restrictions No     Balance Screen   Has the  patient fallen in the past 6 months Yes   How many times? 1  slipped getting out of bed, landed on floor, called EMS   Has the patient had a decrease in activity level because of a fear of falling?  No   Is the patient reluctant to leave their home because of a fear of falling?  No     Home Environment   Living Environment Private residence   Living Arrangements Non-relatives/Friends   Type of [place] to enter   Entrance Stairs-Number of Steps 4   Entrance Stairs-Rails Left  going up   [place] - single point;Shower seat - built in;Grab bars - tub/shower;Hand held shower head     Prior Function   Level of Independence Independent   Vocation Retired   Biomedical scientist retired Pharmacist, hospital   Leisure stained glass, wood working     Cognition   Overall Cognitive Status Within [employer] [facility] for tasks assessed     Observation/Other Assessments   Modified [place] 36%     Posture/Postural Control   Posture/Postural Control Postural limitations   Posture Comments increased thoracic kyphosis, forward head and rounded shoulders     ROM / Strength   AROM / PROM / Strength AROM;Strength     AROM   Overall AROM Comments UE's shoulder forward elevation 150 degrees, ER, IR WFL     Strength   Overall Strength Comments bilateral UE's shoulder flexion, abduction, ER/IR decreased 4-/5, periscapular muscles and lats. decresaed strength     Palpation   Palpation comment thoracic to lumbar spine tender to palaption along spinous processes and right side paraspinal muscles lower thoracic into lumbar spine     Ambulation/Gait   Gait Comments ambulating independently without AD with short step length bilateral, decresaed hip/knee flexion bilateral            Objective measurements completed on examination: See above findings.                  PT Education - 06/27/17 1700    Education provided Yes    Education Details POC; body mechanics for daily activities; hip adduction with ball and glute sets, scapula retraction with resistive band, high row, shoulder extension to hips red resistive band   Person(s) Educated Patient   Methods Explanation;Demonstration;Verbal cues;Handout   Comprehension Returned demonstration;Verbal cues required;Verbalized understanding             PT Long Term Goals - 06/27/17 1732      PT LONG TERM GOAL #1   Title Patient will demonstrate improved function with walking, standing and use of UE's  with MODI score of <20%    Baseline MODI 36%   Status New   Target Date 07/25/17     PT LONG TERM GOAL #2   Title Patient will be independent with home program to continue with strength, balance/endurance by discharge to continue self management   Baseline limited knowledge of appropriate exercises and progression to improve function   Status New   Target Date 07/25/17                Plan - 06/27/17 1630    Clinical Impression Statement Patient is a 71 year old female who presents with chronic midline back pain that has worsened over the past year with decresaed functional use of UE's. She has spasms along right side mid to lower back and she is limited in abiltiy to stand for prolonged periods. Her MODI score of 36% indicates moderate self perceived disability. She has limited knowledge of appropriate exercises and self managemnt of pain and will therefore benefit from physical therapy intervention to address functional limitations in order to achieve goals.    History and Personal Factors relevant to plan of care: chronic back pain, kidney problems; on dialysis;    Clinical Presentation Evolving   Clinical Presentation due to: worseingin symptoms with decreased functional use UE's    Clinical Decision Making Moderate   Rehab Potential Fair   Clinical Impairments Affecting Rehab Potential (+)motivated(-)chronic condition, comorbidities, age   PT  Frequency 2x / week   PT Duration 4 weeks   PT Treatment/Interventions Patient/family education;Electrical Stimulation;Cryotherapy;Moist Heat;Neuromuscular re-education;Therapeutic exercise;Manual techniques   PT Next Visit Plan therapeutic exercise   PT Home Exercise Plan scapular retraction, posture awareness, body mechanics   Consulted and Agree with Plan of Care Patient      Patient will benefit from skilled therapeutic intervention in order to improve the following deficits and impairments:  Decreased strength, Impaired perceived functional ability, Decreased activity tolerance, Decreased endurance, Pain, Increased muscle spasms, Impaired UE functional use  Visit Diagnosis: Muscle weakness (generalized) - Plan: PT plan of care cert/re-cert  Difficulty in walking, not elsewhere classified - Plan: PT plan of care cert/re-cert  Chronic midline low back pain without sciatica - Plan: PT plan of care cert/re-cert      G-Codes - 17/51/02 1710    Functional Assessment Tool Used (Outpatient Only) MODI, strength, pain, clinical judgment   Functional Limitation Mobility: Walking and moving around   Mobility: Walking and Moving Around Current Status (H8527) At least 40 percent but less than 60 percent impaired, limited or restricted   Mobility: Walking and Moving Around Goal Status [phone]) At least 20 percent but less than 40 percent impaired, limited or restricted       Problem List Patient Active Problem List   Diagnosis Date Noted  . Chest discomfort 08/06/2015  . Hyperlipidemia 08/06/2015  . Complication from renal dialysis device 04/12/2015  . SOB (shortness of breath) 02/01/2015  . End stage renal disease ([place]) 02/01/2015  . 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 Type 2 diabetes mellitus with other specified complication ([place]) 35/36/1443  . Asthma 02/01/2015  . Chronic diastolic CHF (congestive heart failure) ([place]) 02/01/2015    Jomarie Longs PT 06/28/2017, 10:51  PM  [facility] [address], [place], [address] Phone: [phone]   Fax:  [phone]  Name: Phyllis [name] MRN: [id number] Date of Birth: 1946-03-11

## 2017-07-02 ENCOUNTER — Ambulatory Visit: Payer: Medicare Other | Admitting: Physical Therapy

## 2017-07-02 DIAGNOSIS — R262 Difficulty in walking, not elsewhere classified: Secondary | ICD-10-CM

## 2017-07-02 DIAGNOSIS — M6281 Muscle weakness (generalized): Secondary | ICD-10-CM

## 2017-07-02 DIAGNOSIS — M545 Low back pain, unspecified: Secondary | ICD-10-CM

## 2017-07-02 DIAGNOSIS — G8929 Other chronic pain: Secondary | ICD-10-CM

## 2017-07-02 NOTE — Therapy (Signed)
[facility] [address] [place], [address] Phone: [phone]   Fax:  [phone]  Physical Therapy Treatment  Patient Details  Name: Phyllis [name] MRN: [id number] Date of Birth: 1946-01-14 Referring Provider: Azzie Glatter MD  Encounter Date: 07/02/2017      PT End of Session - 07/02/17 1515    Visit Number 2   Number of Visits 8   Date for PT Re-Evaluation 07/25/17   Authorization Type 2   Authorization Time Period 10 (G-code)   PT Start Time 1443   PT Stop Time 1515   PT Time Calculation (min) 32 min   Activity Tolerance Patient tolerated treatment well   Behavior During Therapy [name] for tasks assessed/performed      Past Medical History:  Diagnosis Date  . Allergy   . Asthma   . Chronic kidney disease   . COPD (chronic obstructive pulmonary disease) ([place])   . Diabetes mellitus without complication ([place])   . Dialysis patient ([place])    3 days every week.   . Dialysis patient ([place])   . Fistula    lower left arm  . GERD (gastroesophageal reflux disease)   . Gout   . Hypertension   . 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 Sleep apnea     Past Surgical History:  Procedure Laterality Date  . carpel tunnel    . GALLBLADDER SURGERY    . PERIPHERAL VASCULAR CATHETERIZATION N/A 04/12/2015   Procedure: A/V Shuntogram/Fistulagram;  Surgeon: Algernon Huxley, MD;  Location: [facility];  Service: Cardiovascular;  Laterality: N/A;  . PERIPHERAL VASCULAR CATHETERIZATION N/A 04/12/2015   Procedure: A/V Shunt Intervention;  Surgeon: Algernon Huxley, MD;  Location: [facility];  Service: Cardiovascular;  Laterality: N/A;  . PERIPHERAL VASCULAR CATHETERIZATION N/A 06/09/2015   Procedure: Dialysis/Perma Catheter Removal;  Surgeon: Katha Cabal, MD;  Location: [facility];  Service: Cardiovascular;  Laterality: N/A;    There were no vitals  filed for this visit.      Subjective Assessment - 07/02/17 1444    Subjective Patient reports she is not having any discomfort in her back today and has not done a whole lot of activity today. She reports she is exercising at home as instructed.   Pertinent History Pain in her back is chronic and has worsened over the past year. Patient reports history of kidney problems and is on dialysis currently since last year, 2x/week. Plantar fascitis within the past year with injections within the past year. also reports she has had back pain intermittently with activity over the past >10 years.    Limitations House hold activities;Other (comment);Lifting  doing crafts, wood working, stained glass   How long can you sit comfortably? as long as she wants    How long can you stand comfortably? 30 min or less   How long can you walk comfortably? unable to walk long distances   Diagnostic tests MRI   Patient Stated Goals less back pain to allow her to do house keeping chores and do wood working, stained glass with less back pain   Currently in Pain? No/denies       Objective: Palpation: increased spasms along left side of lumbar spine, decreased thoracic spine mobility Periscapular control decreased bilaterally  Treatment: Therapeutic exercise: patient performed exercises with verbal, tactile cues and demonstration of therapist: Goal: improve MODI; independent with  home program Sitting:  Hip adduction with ball and glute sets x 10 reps Hip abduction with manual resistance x 10 reps with VC Knee extension with tapping balance stones in front x 15 reps Rocker board for DF/PF x 2 min./ weight shifting side to side x 2 min. UE's: Scapular retraction x 15 reps standing red resistive band scapular retraction with shoulder extension to hips x 15 reps Sitting scapular retraction sets of 5 reps, alternating with thoracic extension x 5 reps with tactile cues and guidance mid thoracic spine, instructed in  self mobilization in chair over towel or pillow Sitting shoulder elevation and depression x 5 reps  Patient response to treatment: patient demonstrated improved technique with exercises with minimal VC for correct alignment. Improved ability to perform scapular retraction with increased ROM following thoracic spine exercises/mobilization and repetitions.            PT Education - 07/02/17 2330    Education provided Yes   Education Details exercise instruction for home: shoulder elevation, depression, retraction and thoracic extension in sitting   Person(s) Educated Patient   Methods Explanation;Demonstration;Verbal cues   Comprehension Verbalized understanding;Returned demonstration;Verbal cues required             PT Long Term Goals - 06/27/17 1732      PT LONG TERM GOAL #1   Title Patient will demonstrate improved function with walking, standing and use of UE's with MODI score of <20%    Baseline MODI 36%   Status New   Target Date 07/25/17     PT LONG TERM GOAL #2   Title Patient will be independent with home program to continue with strength, balance/endurance by discharge to continue self management   Baseline limited knowledge of appropriate exercises and progression to improve function   Status New   Target Date 07/25/17               Plan - 07/02/17 1516    Clinical Impression Statement Patient demonstrated improved technique with exercises with minimal VC and demonstration. Patient demonstrates steady progress towards goals with improvement noted in ROM, strength, endurance.  Patient will benefit from continued physical therapy intervention to address limitations and achieve goals.    Rehab Potential Fair   Clinical Impairments Affecting Rehab Potential (+)motivated(-)chronic condition, comorbidities, age   PT Frequency 2x / week   PT Duration 4 weeks   PT Treatment/Interventions Patient/family education;Electrical Stimulation;Cryotherapy;Moist  Heat;Neuromuscular re-education;Therapeutic exercise;Manual techniques   PT Next Visit Plan therapeutic exercise   PT Home Exercise Plan scapular retraction, posture awareness, body mechanics      Patient will benefit from skilled therapeutic intervention in order to improve the following deficits and impairments:  Decreased strength, Impaired perceived functional ability, Decreased activity tolerance, Decreased endurance, Pain, Increased muscle spasms, Impaired UE functional use  Visit Diagnosis: Muscle weakness (generalized)  Difficulty in walking, not elsewhere classified  Chronic midline low back pain without sciatica     Problem List Patient Active Problem List   Diagnosis Date Noted  . Chest discomfort 08/06/2015  . Hyperlipidemia 08/06/2015  . Complication from renal dialysis device 04/12/2015  . SOB (shortness of breath) 02/01/2015  . End stage renal disease ([place]) 02/01/2015  . 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 Type 2 diabetes mellitus with other specified complication ([place]) 73/22/0254  . Asthma 02/01/2015  . Chronic diastolic CHF (congestive heart failure) ([place]) 02/01/2015    Jomarie Longs PT 07/03/2017, 2:11 PM  [facility] [address] [place], [address] Phone: [phone]   Fax:  [phone]  Name: Phyllis [name] MRN: [id number] Date of Birth: 12-12-1945

## 2017-07-05 ENCOUNTER — Ambulatory Visit: Payer: Medicare Other | Admitting: Physical Therapy

## 2017-07-09 ENCOUNTER — Ambulatory Visit: Payer: Medicare Other | Admitting: Physical Therapy

## 2017-07-11 ENCOUNTER — Ambulatory Visit: Payer: Medicare Other | Admitting: Physical Therapy

## 2017-07-11 ENCOUNTER — Encounter: Payer: Self-pay | Admitting: Physical Therapy

## 2017-07-11 DIAGNOSIS — G8929 Other chronic pain: Secondary | ICD-10-CM

## 2017-07-11 DIAGNOSIS — M6281 Muscle weakness (generalized): Secondary | ICD-10-CM | POA: Diagnosis not present

## 2017-07-11 DIAGNOSIS — R262 Difficulty in walking, not elsewhere classified: Secondary | ICD-10-CM

## 2017-07-11 DIAGNOSIS — M545 Low back pain: Secondary | ICD-10-CM

## 2017-07-12 NOTE — Therapy (Signed)
[facility] [address] [place], [address] Phone: [phone]   Fax:  [phone]  Physical Therapy Treatment  Patient Details  Name: Phyllis [name] MRN: [id number] Date of Birth: 09/24/1946 Referring Provider: Azzie Glatter MD  Encounter Date: 07/11/2017      PT End of Session - 07/11/17 0958    Visit Number 3   Number of Visits 8   Date for PT Re-Evaluation 07/25/17   Authorization Type 2   Authorization Time Period 10 (G-code)   PT Start Time 0955   PT Stop Time 1027   PT Time Calculation (min) 32 min   Activity Tolerance Patient tolerated treatment well   Behavior During Therapy [facility] for tasks assessed/performed      Past Medical History:  Diagnosis Date  . Allergy   . Asthma   . Chronic kidney disease   . COPD (chronic obstructive pulmonary disease) ([place])   . Diabetes mellitus without complication ([place])   . Dialysis patient ([place])    3 days every week.   . Dialysis patient ([place])   . Fistula    lower left arm  . GERD (gastroesophageal reflux disease)   . Gout   . Hypertension   . 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 Sleep apnea     Past Surgical History:  Procedure Laterality Date  . carpel tunnel    . GALLBLADDER SURGERY    . PERIPHERAL VASCULAR CATHETERIZATION N/A 04/12/2015   Procedure: A/V Shuntogram/Fistulagram;  Surgeon: Algernon Huxley, MD;  Location: [facility];  Service: Cardiovascular;  Laterality: N/A;  . PERIPHERAL VASCULAR CATHETERIZATION N/A 04/12/2015   Procedure: A/V Shunt Intervention;  Surgeon: Algernon Huxley, MD;  Location: [facility];  Service: Cardiovascular;  Laterality: N/A;  . PERIPHERAL VASCULAR CATHETERIZATION N/A 06/09/2015   Procedure: Dialysis/Perma Catheter Removal;  Surgeon: Katha Cabal, MD;  Location: [facility];  Service: Cardiovascular;  Laterality: N/A;    There were no vitals  filed for this visit.      Subjective Assessment - 07/11/17 0956    Subjective Patient reports she is not having any discomfort in her back today and has not done a whole lot.    Pertinent History Pain in her back is chronic and has worsened over the past year. Patient reports history of kidney problems and is on dialysis currently since last year, 2x/week. Plantar fascitis within the past year with injections within the past year. also reports she has had back pain intermittently with activity over the past >10 years.    Limitations House hold activities;Other (comment);Lifting  doing crafts, wood working, stained glass   How long can you sit comfortably? as long as she wants    How long can you stand comfortably? 30 min or less   How long can you walk comfortably? unable to walk long distances   Diagnostic tests MRI   Patient Stated Goals less back pain to allow her to do house keeping chores and do wood working, stained glass with less back pain   Currently in Pain? No/denies      Objective: Posture: increased thoracic kyphosis, rounded shoulders  Palpation: decreased thoracic spine mobility Periscapular control decreased bilaterally, improved from previous session  Treatment: Therapeutic exercise: patient performed exercises with verbal, tactile cues and demonstration of therapist: Goal: improve MODI; independent with home program Sitting:  Hip adduction with ball and  glute sets x 10 reps Hip abduction with manual resistance x 10 reps with VC Rocker board for DF/PF x 2 min./ weight shifting side to side x 2 min. Sitting on stability ball:  Slouch correct posture x 10 Rhythmic stabilization flexion/extension x 10 reps with patient using UE's for balance/support as needed Knee extension alternating LE's x 10  UE's: seated:  Scapular retraction x 15 reps red resistive band Sitting shoulder elevation and depression x 5 reps Raise arms overhead with breathing in, breathe out when  lowering 5 reps Scapular retraction x 10   Patient response to treatment: Patient demonstrated improved technique with exercises with minimal cuing and assistance.        PT Education - 07/11/17 0956    Education provided Yes   Education Details exercise instruction with shoulder exercises and thoracic mobilization   Person(s) Educated Patient   Methods Explanation;Demonstration;Verbal cues   Comprehension Verbalized understanding;Returned demonstration;Verbal cues required             PT Long Term Goals - 06/27/17 1732      PT LONG TERM GOAL #1   Title Patient will demonstrate improved function with walking, standing and use of UE's with MODI score of <20%    Baseline MODI 36%   Status New   Target Date 07/25/17     PT LONG TERM GOAL #2   Title Patient will be independent with home program to continue with strength, balance/endurance by discharge to continue self management   Baseline limited knowledge of appropriate exercises and progression to improve function   Status New   Target Date 07/25/17               Plan - 07/11/17 1030    Clinical Impression Statement Patient demonstrates improving knowledge of appropriate exercises, intensity and self management of pain with good carry over between sessions. She continues with pain in upper back and UE's with crafts and this should improve with continued exercise instruction with goal of transition to indpendent HEP and self managment.    Rehab Potential Fair   Clinical Impairments Affecting Rehab Potential (+)motivated(-)chronic condition, comorbidities, age   PT Frequency 2x / week   PT Duration 4 weeks   PT Treatment/Interventions Patient/family education;Electrical Stimulation;Cryotherapy;Moist Heat;Neuromuscular re-education;Therapeutic exercise;Manual techniques   PT Next Visit Plan therapeutic exercise   PT Home Exercise Plan scapular retraction, posture awareness, body mechanics      Patient will  benefit from skilled therapeutic intervention in order to improve the following deficits and impairments:  Decreased strength, Impaired perceived functional ability, Decreased activity tolerance, Decreased endurance, Pain, Increased muscle spasms, Impaired UE functional use  Visit Diagnosis: Muscle weakness (generalized)  Difficulty in walking, not elsewhere classified  Chronic midline low back pain without sciatica     Problem List Patient Active Problem List   Diagnosis Date Noted  . Chest discomfort 08/06/2015  . Hyperlipidemia 08/06/2015  . Complication from renal dialysis device 04/12/2015  . SOB (shortness of breath) 02/01/2015  . End stage renal disease ([place]) 02/01/2015  . 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 Type 2 diabetes mellitus with other specified complication ([place]) 98/92/1194  . Asthma 02/01/2015  . Chronic diastolic CHF (congestive heart failure) ([place]) 02/01/2015    Jomarie Longs PT 07/12/2017, 1:22 PM  [facility] [address], [place], [address] Phone: [phone]   Fax:  [phone]  Name: MAKINZY CLEERE MRN: [id number] Date of Birth: 30-Aug-1946

## 2017-07-18 ENCOUNTER — Encounter: Payer: Self-pay | Admitting: Physical Therapy

## 2017-07-18 ENCOUNTER — Ambulatory Visit: Payer: Medicare Other | Admitting: Physical Therapy

## 2017-07-18 DIAGNOSIS — M545 Low back pain, unspecified: Secondary | ICD-10-CM

## 2017-07-18 DIAGNOSIS — R262 Difficulty in walking, not elsewhere classified: Secondary | ICD-10-CM

## 2017-07-18 DIAGNOSIS — G8929 Other chronic pain: Secondary | ICD-10-CM

## 2017-07-18 DIAGNOSIS — M6281 Muscle weakness (generalized): Secondary | ICD-10-CM | POA: Diagnosis not present

## 2017-07-18 NOTE — Therapy (Signed)
[facility] [address] [place], [address] Phone: [phone]   Fax:  [phone]  Physical Therapy Treatment  Patient Details  Name: Phyllis [name] MRN: [id number] Date of Birth: Mar 18, 1946 Referring Provider: Azzie Glatter MD  Encounter Date: 07/18/2017      PT End of Session - 07/18/17 1003    Visit Number 4   Number of Visits 8   Date for PT Re-Evaluation 07/25/17   Authorization Type 2   Authorization Time Period 10 (G-code)   PT Start Time [phone]   PT Stop Time 1030   PT Time Calculation (min) 32 min   Activity Tolerance Patient tolerated treatment well   Behavior During Therapy [facility] for tasks assessed/performed      Past Medical History:  Diagnosis Date  . Allergy   . Asthma   . Chronic kidney disease   . COPD (chronic obstructive pulmonary disease) ([place])   . Diabetes mellitus without complication ([place])   . Dialysis patient ([place])    3 days every week.   . Dialysis patient ([place])   . Fistula    lower left arm  . GERD (gastroesophageal reflux disease)   . Gout   . Hypertension   . 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 Sleep apnea     Past Surgical History:  Procedure Laterality Date  . carpel tunnel    . GALLBLADDER SURGERY    . PERIPHERAL VASCULAR CATHETERIZATION N/A 04/12/2015   Procedure: A/V Shuntogram/Fistulagram;  Surgeon: Algernon Huxley, MD;  Location: [place] [facility];  Service: Cardiovascular;  Laterality: N/A;  . PERIPHERAL VASCULAR CATHETERIZATION N/A 04/12/2015   Procedure: A/V Shunt Intervention;  Surgeon: Algernon Huxley, MD;  Location: [place] [facility];  Service: Cardiovascular;  Laterality: N/A;  . PERIPHERAL VASCULAR CATHETERIZATION N/A 06/09/2015   Procedure: Dialysis/Perma Catheter Removal;  Surgeon: Katha Cabal, MD;  Location: [place] [facility];  Service: Cardiovascular;  Laterality: N/A;    There were no vitals  filed for this visit.      Subjective Assessment - 07/18/17 1001    Subjective Patient reports she did not do as well with dialysis yesterday with cramping in her toes. Today she feels a little weak in her legs.    Pertinent History Pain in her back is chronic and has worsened over the past year. Patient reports history of kidney problems and is on dialysis currently since last year, 2x/week. Plantar fascitis within the past year with injections within the past year. also reports she has had back pain intermittently with activity over the past >10 years.    Limitations House hold activities;Other (comment);Lifting  doing crafts, wood working, stained glass   How long can you sit comfortably? as long as she wants    How long can you stand comfortably? 30 min or less   How long can you walk comfortably? unable to walk long distances   Diagnostic tests MRI   Patient Stated Goals less back pain to allow her to do house keeping chores and do wood working, stained glass with less back pain   Currently in Pain? No/denies        Objective: Posture: increased thoracic kyphosis, rounded shoulders  Periscapular control decreased bilaterally, improved from previous session  Treatment: Therapeutic exercise: patient performed exercises with verbal, tactile cues and demonstration of therapist: Goal: improve MODI; independent with home program Sitting:  Hip adduction  with ball and glute sets x 10 reps Hip abduction with manual resistance x 10 reps with VC Rocker board for DF/PF x 2 min Slouch correct posture x 10 Knee extension alternating LE's x 10  UE's: seated:  Scapular retraction 2 x 15 reps red resistive band Straight arm pull downs 2 x 15 palloff modified press forward 2 x 15 reps Sitting shoulder elevation and depression x 5 reps Raise arms overhead with breathing in, breathe out when lowering 5 reps Hug a tree position/stretch x 5 reps Scapular retraction x 10   Patient response to  treatment: Patient improved technique with all exercises with demonstration and minimal cuing       PT Education - 07/18/17 1013    Education provided Yes   Education Details exercise instruction with cuing for technique   Person(s) Educated Patient   Methods Explanation;Demonstration;Verbal cues   Comprehension Verbalized understanding;Returned demonstration;Verbal cues required             PT Long Term Goals - 06/27/17 1732      PT LONG TERM GOAL #1   Title Patient will demonstrate improved function with walking, standing and use of UE's with MODI score of <20%    Baseline MODI 36%   Status New   Target Date 07/25/17     PT LONG TERM GOAL #2   Title Patient will be independent with home program to continue with strength, balance/endurance by discharge to continue self management   Baseline limited knowledge of appropriate exercises and progression to improve function   Status New   Target Date 07/25/17               Plan - 07/18/17 1003    Clinical Impression Statement Patient demonstrates improvement with stabilization and strength with exercises. Patient demonstrates steady progress towards goals with improvement noted in strength, endurance.  Patient will benefit from continued physical therapy intervention to address limitations and achieve goals.     Rehab Potential Fair   Clinical Impairments Affecting Rehab Potential (+)motivated(-)chronic condition, comorbidities, age   PT Frequency 2x / week   PT Duration 4 weeks   PT Treatment/Interventions Patient/family education;Electrical Stimulation;Cryotherapy;Moist Heat;Neuromuscular re-education;Therapeutic exercise;Manual techniques   PT Next Visit Plan therapeutic exercise   PT Home Exercise Plan scapular retraction, posture awareness, body mechanics      Patient will benefit from skilled therapeutic intervention in order to improve the following deficits and impairments:  Decreased strength, Impaired  perceived functional ability, Decreased activity tolerance, Decreased endurance, Pain, Increased muscle spasms, Impaired UE functional use  Visit Diagnosis: Muscle weakness (generalized)  Difficulty in walking, not elsewhere classified  Chronic midline low back pain without sciatica     Problem List Patient Active Problem List   Diagnosis Date Noted  . Chest discomfort 08/06/2015  . Hyperlipidemia 08/06/2015  . Complication from renal dialysis device 04/12/2015  . SOB (shortness of breath) 02/01/2015  . End stage renal disease ([place]) 02/01/2015  . 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 Type 2 diabetes mellitus with other specified complication ([place]) 82/99/3716  . Asthma 02/01/2015  . Chronic diastolic CHF (congestive heart failure) ([place]) 02/01/2015    Jomarie Longs PT 07/18/2017, 10:51 PM  [facility] [address] [place], [address] Phone: [phone]   Fax:  [phone]  Name: Phyllis [name] MRN: [id number] Date of Birth: [birth year]

## 2017-07-25 ENCOUNTER — Encounter: Payer: Self-pay | Admitting: Physical Therapy

## 2017-07-25 ENCOUNTER — Ambulatory Visit: Payer: Medicare Other | Attending: Internal Medicine | Admitting: Physical Therapy

## 2017-07-25 DIAGNOSIS — M545 Low back pain, unspecified: Secondary | ICD-10-CM

## 2017-07-25 DIAGNOSIS — R262 Difficulty in walking, not elsewhere classified: Secondary | ICD-10-CM | POA: Diagnosis present

## 2017-07-25 DIAGNOSIS — G8929 Other chronic pain: Secondary | ICD-10-CM | POA: Diagnosis present

## 2017-07-25 DIAGNOSIS — M6281 Muscle weakness (generalized): Secondary | ICD-10-CM | POA: Diagnosis present

## 2017-07-25 NOTE — Therapy (Signed)
[facility] [address], [place], [address] Phone: [phone]   Fax:  [phone]  Physical Therapy Treatment  Patient Details  Name: Phyllis [name] MRN: [id number] Date of Birth: [birth year] Referring Provider: Azzie Glatter MD  Encounter Date: 07/25/2017      PT End of Session - 07/25/17 1501    Visit Number 5   Number of Visits 8   Date for PT Re-Evaluation 09/12/17   Authorization Type 5   Authorization Time Period 10 (G-code)   PT Start Time 1458   PT Stop Time 1528   PT Time Calculation (min) 30 min   Activity Tolerance Patient tolerated treatment well   Behavior During Therapy [facility] for tasks assessed/performed      Past Medical History:  Diagnosis Date  . Allergy   . Asthma   . Chronic kidney disease   . COPD (chronic obstructive pulmonary disease) ([place])   . Diabetes mellitus without complication ([place])   . Dialysis patient ([place])    3 days every week.   . Dialysis patient ([place])   . Fistula    lower left arm  . GERD (gastroesophageal reflux disease)   . Gout   . Hypertension   . 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 Sleep apnea     Past Surgical History:  Procedure Laterality Date  . carpel tunnel    . GALLBLADDER SURGERY    . PERIPHERAL VASCULAR CATHETERIZATION N/A 04/12/2015   Procedure: A/V Shuntogram/Fistulagram;  Surgeon: Algernon Huxley, MD;  Location: [place] [facility];  Service: Cardiovascular;  Laterality: N/A;  . PERIPHERAL VASCULAR CATHETERIZATION N/A 04/12/2015   Procedure: A/V Shunt Intervention;  Surgeon: Algernon Huxley, MD;  Location: [place] [facility];  Service: Cardiovascular;  Laterality: N/A;  . PERIPHERAL VASCULAR CATHETERIZATION N/A 06/09/2015   Procedure: Dialysis/Perma Catheter Removal;  Surgeon: Katha Cabal, MD;  Location: [place] [facility];  Service: Cardiovascular;  Laterality: N/A;    There were no vitals  filed for this visit.      Subjective Assessment - 07/25/17 1500    Subjective patient reports she did well with dialysis yesterday and has not had pain in back due to not doing much at home. Still feels weak in legs.    Pertinent History Pain in her back is chronic and has worsened over the past year. Patient reports history of kidney problems and is on dialysis currently since last year, 2x/week. Plantar fascitis within the past year with injections within the past year. also reports she has had back pain intermittently with activity over the past >10 years.    Limitations House hold activities;Other (comment);Lifting  doing crafts, wood working, stained glass   How long can you sit comfortably? as long as she wants    How long can you stand comfortably? 30 min or less   How long can you walk comfortably? unable to walk long distances   Diagnostic tests MRI   Patient Stated Goals less back pain to allow her to do house keeping chores and do wood working, stained glass with less back pain   Currently in Pain? No/denies      Objective: Posture: increased thoracic kyphosis, rounded shoulders  Periscapular control decreased bilaterally, improved from previous session Outcome measure: MODI 44% (0 = no self perceived disability)  Treatment: Therapeutic exercise: patient performed exercises with verbal, tactile cues and demonstration of therapist: Goal:  improve MODI; independent with home program Sitting:  Hip adduction with ball and glute sets x 10 reps Hip abduction with manual resistance x 10 reps with VC Slouch correct posture x 10 Knee extension alternating LE's 3# weights on ankles 2  x 10  UE's: seated:  Scapular retraction 2 x 15 reps red resistive band Straight arm pull downs 2 x 15 palloff modified press forward 2 x 15 reps Sitting shoulder elevation and depression x 5 reps Raise arms overhead with breathing in, breathe out when lowering 5 reps Hug a tree position/stretch x  5 reps  Patient response to treatment: Patient improved technique with all exercises with demonstration and minimal cuing; mild fatigue noted w exercises          PT Education - 07/25/17 1501    Education provided Yes   Education Details exercise instruction    Person(s) Educated Patient   Methods Explanation;Demonstration;Verbal cues   Comprehension Verbalized understanding;Returned demonstration;Verbal cues required             PT Long Term Goals - 07/25/17 1552      PT LONG TERM GOAL #1   Title Patient will demonstrate improved function with walking, standing and use of UE's with MODI score of <20%    Baseline MODI 36%; 07/25/17 44%   Status Revised   Target Date 09/12/17     PT LONG TERM GOAL #2   Title Patient will be independent with home program to continue with strength, balance/endurance by discharge to continue self management   Baseline limited knowledge of appropriate exercises and progression to improve function   Status Revised   Target Date 09/12/17               Plan - 07/25/17 1600    Clinical Impression Statement Patient demonstrates improvement with strength in core and UE's and continues with generalized weakness in LE's which limit ambulation and daily tasks without difficulty. Her MODI score of 44% indicates continued moderate self perceived disability. She will benefit from additional physical therapy intervention to address strength deficits and pain in order to achieve maximal function and be able to transition to self management.    Rehab Potential Fair   Clinical Impairments Affecting Rehab Potential (+)motivated(-)chronic condition, comorbidities, age   PT Frequency 2x / week   PT Duration 6 weeks   PT Treatment/Interventions Patient/family education;Electrical Stimulation;Cryotherapy;Moist Heat;Neuromuscular re-education;Therapeutic exercise;Manual techniques   PT Next Visit Plan therapeutic exercise   PT Home Exercise Plan scapular  retraction, posture awareness, body mechanics      Patient will benefit from skilled therapeutic intervention in order to improve the following deficits and impairments:  Decreased strength, Impaired perceived functional ability, Decreased activity tolerance, Decreased endurance, Pain, Increased muscle spasms, Impaired UE functional use  Visit Diagnosis: Muscle weakness (generalized) - Plan: PT plan of care cert/re-cert  Difficulty in walking, not elsewhere classified - Plan: PT plan of care cert/re-cert  Chronic midline low back pain without sciatica - Plan: PT plan of care cert/re-cert     Problem List Patient Active Problem List   Diagnosis Date Noted  . Chest discomfort 08/06/2015  . Hyperlipidemia 08/06/2015  . Complication from renal dialysis device 04/12/2015  . SOB (shortness of breath) 02/01/2015  . End stage renal disease ([place]) 02/01/2015  . 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 Type 2 diabetes mellitus with other specified complication ([place]) 16/08/9603  . Asthma 02/01/2015  . Chronic diastolic CHF (congestive heart failure) ([place]) 02/01/2015  Jomarie Longs PT 07/26/2017, 10:31 PM  [facility] [address] [place], [address] Phone: [phone]   Fax:  [phone]  Name: ARYIA DELIRA MRN: [id number] Date of Birth: [birth year]

## 2017-07-30 ENCOUNTER — Ambulatory Visit: Payer: Medicare Other | Admitting: Physical Therapy

## 2017-07-30 ENCOUNTER — Encounter: Payer: Self-pay | Admitting: Physical Therapy

## 2017-07-30 DIAGNOSIS — M6281 Muscle weakness (generalized): Secondary | ICD-10-CM

## 2017-07-30 DIAGNOSIS — M545 Low back pain, unspecified: Secondary | ICD-10-CM

## 2017-07-30 DIAGNOSIS — G8929 Other chronic pain: Secondary | ICD-10-CM

## 2017-07-30 DIAGNOSIS — R262 Difficulty in walking, not elsewhere classified: Secondary | ICD-10-CM

## 2017-07-30 NOTE — Therapy (Signed)
[facility] [address] [place], [address] Phone: [phone]   Fax:  [phone]  Physical Therapy Treatment  Patient Details  Name: Phyllis [name] MRN: [id number] Date of Birth: [birth year] Referring Provider: Azzie Glatter MD  Encounter Date: 07/30/2017      PT End of Session - 07/30/17 1440    Visit Number 6   Number of Visits 8   Date for PT Re-Evaluation 09/12/17   Authorization Type 6   Authorization Time Period 10 (G-code)   PT Start Time 1436   PT Stop Time 1510   PT Time Calculation (min) 34 min   Activity Tolerance Patient tolerated treatment well   Behavior During Therapy [facility] for tasks assessed/performed      Past Medical History:  Diagnosis Date  . Allergy   . Asthma   . Chronic kidney disease   . COPD (chronic obstructive pulmonary disease) ([place])   . Diabetes mellitus without complication ([place])   . Dialysis patient ([place])    3 days every week.   . Dialysis patient ([place])   . Fistula    lower left arm  . GERD (gastroesophageal reflux disease)   . Gout   . Hypertension   . 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 Sleep apnea     Past Surgical History:  Procedure Laterality Date  . carpel tunnel    . GALLBLADDER SURGERY    . PERIPHERAL VASCULAR CATHETERIZATION N/A 04/12/2015   Procedure: A/V Shuntogram/Fistulagram;  Surgeon: Algernon Huxley, MD;  Location: [place] [facility];  Service: Cardiovascular;  Laterality: N/A;  . PERIPHERAL VASCULAR CATHETERIZATION N/A 04/12/2015   Procedure: A/V Shunt Intervention;  Surgeon: Algernon Huxley, MD;  Location: [place] [facility];  Service: Cardiovascular;  Laterality: N/A;  . PERIPHERAL VASCULAR CATHETERIZATION N/A 06/09/2015   Procedure: Dialysis/Perma Catheter Removal;  Surgeon: Katha Cabal, MD;  Location: [place] [facility];  Service: Cardiovascular;  Laterality: N/A;    There were no vitals  filed for this visit.      Subjective Assessment - 07/30/17 1439    Subjective patient reports she did well with dialysis yesterday and has not had pain in back due to not doing much at home. Still feels weak in legs.    Pertinent History Pain in her back is chronic and has worsened over the past year. Patient reports history of kidney problems and is on dialysis currently since last year, 2x/week. Plantar fascitis within the past year with injections within the past year. also reports she has had back pain intermittently with activity over the past >10 years.    Limitations House hold activities;Other (comment);Lifting  doing crafts, wood working, stained glass   How long can you sit comfortably? as long as she wants    How long can you stand comfortably? 30 min or less   How long can you walk comfortably? unable to walk long distances   Diagnostic tests MRI   Patient Stated Goals less back pain to allow her to do house keeping chores and do wood working, stained glass with less back pain   Currently in Pain? No/denies       Objective: Posture: increased thoracic kyphosis, rounded shoulders  Periscapular control decreased bilaterally, improved from previous session Strength: decreased left LE knee extension 4-/5, hip abduction 4-/5, knee flexion 3+/5  Treatment: Therapeutic exercise: patient performed exercises with verbal, tactile cues and  demonstration of therapist: Goal: improve MODI; independent with home program Sitting:  Hip adduction with ball and glute sets x 10 reps Hip abduction with red resistive band x 10 reps with VC Slouch correct posture x 10 Knee extension alternating LE's 3# weights on ankles 2  x 10  UE's: seated:  Scapular retraction 2x 15 reps red resistive band Straight arm pull downs 2 x 15 palloff modified press forward  x 15 reps Raise arms overhead with breathing in, breathe out when lowering 5 reps Hug a tree position/stretch x 5 reps scaption with  bilateral UE's x 15 reps  Patient response to treatment: Patient demonstrated improved scapular retraction with repetition.  Patient with mild fatigue w exercises             PT Education - 07/30/17 1528    Education provided Yes   Education Details exercise instruction, proper technique and alignment   Person(s) Educated Patient   Methods Explanation;Verbal cues;Demonstration   Comprehension Verbalized understanding;Returned demonstration;Verbal cues required             PT Long Term Goals - 07/25/17 1552      PT LONG TERM GOAL #1   Title Patient will demonstrate improved function with walking, standing and use of UE's with MODI score of <20%    Baseline MODI 36%   Status Revised   Target Date 09/12/17     PT LONG TERM GOAL #2   Title Patient will be independent with home program to continue with strength, balance/endurance by discharge to continue self management   Baseline limited knowledge of appropriate exercises and progression to improve function   Status Revised   Target Date 09/12/17               Plan - 07/30/17 1516    Clinical Impression Statement Patient demonstrated improvement with core control, strength. left LE with decreased strength today as compared to right, improved with repetition. She continues with limitations on strength and endurance and will benefit from additional physical therapy intervention to achieve goals.     Rehab Potential Fair   Clinical Impairments Affecting Rehab Potential (+)motivated(-)chronic condition, comorbidities, age   PT Frequency 2x / week   PT Duration 6 weeks   PT Treatment/Interventions Patient/family education;Electrical Stimulation;Cryotherapy;Moist Heat;Neuromuscular re-education;Therapeutic exercise;Manual techniques   PT Next Visit Plan therapeutic exercise   PT Home Exercise Plan scapular retraction, posture awareness, body mechanics      Patient will benefit from skilled therapeutic intervention in  order to improve the following deficits and impairments:  Decreased strength, Impaired perceived functional ability, Decreased activity tolerance, Decreased endurance, Pain, Increased muscle spasms, Impaired UE functional use  Visit Diagnosis: Muscle weakness (generalized)  Difficulty in walking, not elsewhere classified  Chronic midline low back pain without sciatica     Problem List Patient Active Problem List   Diagnosis Date Noted  . Chest discomfort 08/06/2015  . Hyperlipidemia 08/06/2015  . Complication from renal dialysis device 04/12/2015  . SOB (shortness of breath) 02/01/2015  . End stage renal disease ([place]) 02/01/2015  . 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 Type 2 diabetes mellitus with other specified complication ([place]) 93/57/0177  . Asthma 02/01/2015  . Chronic diastolic CHF (congestive heart failure) ([place]) 02/01/2015    Jomarie Longs PT 07/30/2017, 3:30 PM  [facility] [address] [place], [address] Phone: [phone]   Fax:  [phone]  Name: Phyllis [name] MRN: [id number] Date  of Birth: 12-30-1945

## 2017-08-01 ENCOUNTER — Encounter: Payer: PRIVATE HEALTH INSURANCE | Admitting: Physical Therapy

## 2017-08-08 ENCOUNTER — Ambulatory Visit: Payer: Medicare Other | Admitting: Physical Therapy

## 2017-08-15 ENCOUNTER — Encounter: Payer: Self-pay | Admitting: Physical Therapy

## 2017-08-15 ENCOUNTER — Ambulatory Visit: Payer: Medicare Other | Admitting: Physical Therapy

## 2017-08-15 DIAGNOSIS — M6281 Muscle weakness (generalized): Secondary | ICD-10-CM | POA: Diagnosis not present

## 2017-08-15 DIAGNOSIS — G8929 Other chronic pain: Secondary | ICD-10-CM

## 2017-08-15 DIAGNOSIS — M545 Low back pain, unspecified: Secondary | ICD-10-CM

## 2017-08-15 DIAGNOSIS — R262 Difficulty in walking, not elsewhere classified: Secondary | ICD-10-CM

## 2017-08-16 ENCOUNTER — Emergency Department: Payer: Medicare Other

## 2017-08-16 ENCOUNTER — Emergency Department
Admission: EM | Admit: 2017-08-16 | Discharge: 2017-08-16 | Disposition: A | Discharge status: Home / Self Care | Payer: Medicare Other | Attending: Emergency Medicine | Admitting: Emergency Medicine

## 2017-08-16 DIAGNOSIS — S0990XA Unspecified injury of head, initial encounter: Secondary | ICD-10-CM | POA: Diagnosis present

## 2017-08-16 DIAGNOSIS — Y9301 Activity, walking, marching and hiking: Secondary | ICD-10-CM | POA: Diagnosis not present

## 2017-08-16 DIAGNOSIS — W19XXXA Unspecified fall, initial encounter: Secondary | ICD-10-CM

## 2017-08-16 DIAGNOSIS — Z992 Dependence on renal dialysis: Secondary | ICD-10-CM | POA: Insufficient documentation

## 2017-08-16 DIAGNOSIS — J45909 Unspecified asthma, uncomplicated: Secondary | ICD-10-CM | POA: Insufficient documentation

## 2017-08-16 DIAGNOSIS — I132 Hypertensive heart and chronic kidney disease with heart failure and with stage 5 chronic kidney disease, or end stage renal disease: Secondary | ICD-10-CM | POA: Diagnosis not present

## 2017-08-16 DIAGNOSIS — Z23 Encounter for immunization: Secondary | ICD-10-CM | POA: Diagnosis not present

## 2017-08-16 DIAGNOSIS — N186 End stage renal disease: Secondary | ICD-10-CM | POA: Diagnosis not present

## 2017-08-16 DIAGNOSIS — W010XXA Fall on same level from slipping, tripping and stumbling without subsequent striking against object, initial encounter: Secondary | ICD-10-CM | POA: Diagnosis not present

## 2017-08-16 DIAGNOSIS — E1122 Type 2 diabetes mellitus with diabetic chronic kidney disease: Secondary | ICD-10-CM | POA: Diagnosis not present

## 2017-08-16 DIAGNOSIS — Y929 Unspecified place or not applicable: Secondary | ICD-10-CM | POA: Insufficient documentation

## 2017-08-16 DIAGNOSIS — Y999 Unspecified external cause status: Secondary | ICD-10-CM | POA: Diagnosis not present

## 2017-08-16 DIAGNOSIS — S0003XA Contusion of scalp, initial encounter: Secondary | ICD-10-CM

## 2017-08-16 DIAGNOSIS — J449 Chronic obstructive pulmonary disease, unspecified: Secondary | ICD-10-CM | POA: Insufficient documentation

## 2017-08-16 DIAGNOSIS — I5032 Chronic diastolic (congestive) heart failure: Secondary | ICD-10-CM | POA: Diagnosis not present

## 2017-08-16 DIAGNOSIS — S0101XA Laceration without foreign body of scalp, initial encounter: Secondary | ICD-10-CM | POA: Insufficient documentation

## 2017-08-16 DIAGNOSIS — S40022A Contusion of left upper arm, initial encounter: Secondary | ICD-10-CM | POA: Diagnosis not present

## 2017-08-16 LAB — CBC
HCT: 26.3 % — ABNORMAL LOW (ref 35.0–47.0)
Hemoglobin: 9.2 g/dL — ABNORMAL LOW (ref 12.0–16.0)
MCH: 30.7 pg (ref 26.0–34.0)
MCHC: 35 g/dL (ref 32.0–36.0)
MCV: 87.5 fL (ref 80.0–100.0)
Platelets: 175 10*3/uL (ref 150–440)
RBC: 3 MIL/uL — ABNORMAL LOW (ref 3.80–5.20)
RDW: 13.5 % (ref 11.5–14.5)
WBC: 13.3 10*3/uL — ABNORMAL HIGH (ref 3.6–11.0)

## 2017-08-16 LAB — BASIC METABOLIC PANEL
Anion gap: 13 (ref 5–15)
BUN: 33 mg/dL — ABNORMAL HIGH (ref 6–20)
CO2: 28 mmol/L (ref 22–32)
Calcium: 8.5 mg/dL — ABNORMAL LOW (ref 8.9–10.3)
Chloride: 97 mmol/L — ABNORMAL LOW (ref 101–111)
Creatinine, Ser: 6.31 mg/dL — ABNORMAL HIGH (ref 0.44–1.00)
GFR calc Af Amer: 7 mL/min — ABNORMAL LOW (ref >60)
GFR calc non Af Amer: 6 mL/min — ABNORMAL LOW (ref >60)
Glucose, Bld: 128 mg/dL — ABNORMAL HIGH (ref 65–99)
Potassium: 3.9 mmol/L (ref 3.5–5.1)
Sodium: 138 mmol/L (ref 135–145)

## 2017-08-16 LAB — TROPONIN I
Troponin I: 0.03 ng/mL (ref <0.03)
Troponin I: 0.03 ng/mL (ref <0.03)

## 2017-08-16 LAB — CK: Total CK: 95 U/L (ref 38–234)

## 2017-08-16 MED ORDER — HYDROCODONE-ACETAMINOPHEN 5-325 MG PO TABS
1.0000 | ORAL_TABLET | Freq: Once | ORAL | Status: AC
  Administered 2017-08-16: 1 via ORAL
  Filled 2017-08-16: qty 1

## 2017-08-16 MED ORDER — HYDROCODONE-ACETAMINOPHEN 5-325 MG PO TABS: 1.0000 | ORAL_TABLET | Freq: Four times a day (QID) | ORAL | 0 refills | Status: DC | PRN

## 2017-08-16 MED ORDER — LIDOCAINE-EPINEPHRINE 1 %-1:100000 IJ SOLN
20.0000 mL | Freq: Once | INTRAMUSCULAR | Status: AC
  Administered 2017-08-16: 5 mL via INTRADERMAL
  Filled 2017-08-16: qty 20

## 2017-08-16 MED ORDER — TETANUS-DIPHTH-ACELL PERTUSSIS 5-2.5-18.5 LF-MCG/0.5 IM SUSP
0.5000 mL | Freq: Once | INTRAMUSCULAR | Status: AC
  Administered 2017-08-16: 0.5 mL via INTRAMUSCULAR
  Filled 2017-08-16: qty 0.5

## 2017-08-16 NOTE — ED Provider Notes (Signed)
[facility] Emergency Department Provider Note  ____________________________________________  Time seen: Approximately 8:35 AM  I have reviewed the triage vital signs and the nursing notes.   HISTORY  Chief Complaint Fall    HPI RAWAN RIENDEAU is a 71 y.o. female with a history of ESRD on HD, last full dialysis Tuesday, COPD, HTN, presenting with a fall and head injury. The patient reports that she sustained a fall yesterday when she tripped over some shoes. She denies any loss of consciousness, hitting her head, neck or back pain. She did land on her left side and developed a large left upper extremity hematoma in the upper part of the arm but continues to have full range of motion without significant pain. Today, she was trying to push a foot rest into a reclining chair while standing, and lost her balance, falling backwards and striking the top of her head on a wooden stereo stand. She did not lose consciousness but was unable to get up on her own; she called EMS. She has been ambulatory since the fall without significant difficulty; she is supposed to use a cane but does not use it.the patient denies any severe headache, nausea or vomiting, numbness tingling or weakness. She has not had any chest pain, shortness of breath, lightheadedness or syncope, palpitations over the past several days. She has had no recent illness or change in her medications.   Past Medical History:  Diagnosis Date  . Allergy   . Asthma   . Chronic kidney disease   . COPD (chronic obstructive pulmonary disease) ([place])   . Diabetes mellitus without complication ([place])   . Dialysis patient ([place])    3 days every week.   . Dialysis patient ([place])   . Fistula    lower left arm  . GERD (gastroesophageal reflux disease)   . Gout   . Hypertension   . 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 Sleep apnea     Patient Active Problem List   Diagnosis Date Noted  .  Chest discomfort 08/06/2015  . Hyperlipidemia 08/06/2015  . Complication from renal dialysis device 04/12/2015  . SOB (shortness of breath) 02/01/2015  . End stage renal disease ([place]) 02/01/2015  . Dependence on hemodialysis ([place]) 02/01/2015  . Type 2 diabetes mellitus with other specified complication ([place]) 68[birth year]/1962  . Asthma 02/01/2015  . Chronic diastolic CHF (congestive heart failure) ([place]) 02/01/2015    Past Surgical History:  Procedure Laterality Date  . carpel tunnel    . GALLBLADDER SURGERY    . PERIPHERAL VASCULAR CATHETERIZATION N/A 04/12/2015   Procedure: A/V Shuntogram/Fistulagram;  Surgeon: Algernon Huxley, MD;  Location: [facility];  Service: Cardiovascular;  Laterality: N/A;  . PERIPHERAL VASCULAR CATHETERIZATION N/A 04/12/2015   Procedure: A/V Shunt Intervention;  Surgeon: Algernon Huxley, MD;  Location: [facility];  Service: Cardiovascular;  Laterality: N/A;  . PERIPHERAL VASCULAR CATHETERIZATION N/A 06/09/2015   Procedure: Dialysis/Perma Catheter Removal;  Surgeon: Katha Cabal, MD;  Location: [facility];  Service: Cardiovascular;  Laterality: N/A;    Current Outpatient Rx  . Order #: [id number] Class: Historical Med  . Order #: [id number] Class: Historical Med  . Order #: [id number] Class: Historical Med  . Order #: [id number] Class: Historical Med  . Order #: [id number] Class: Historical Med  . Order #: [id number] Class: Historical Med  . Order #: [id number] Class: Historical Med  . Order #: [id number] Class: Historical Med  . Order #:  [id number] Class: Historical Med  . Order #: [id number] Class: Historical Med  . Order #: [id number] Class: Historical Med  . Order #: [id number] Class: Historical Med  . Order #: [id number] Class: Historical Med  . Order #: [id number] Class: Historical Med  . Order #: [id number] Class: Historical Med  . Order #: [id number] Class: Historical Med  . Order #: [id number] Class: Historical Med    Allergies Codeine; Enalapril maleate;  Nitrofurantoin; Sulfamethoxazole-trimethoprim; Vicodin [hydrocodone-acetaminophen]; 2,4-d dimethylamine (amisol); Baclofen; Neosporin [neomycin-bacitracin zn-polymyx]; Quinine; Ultram [tramadol]; Zocor [simvastatin]; Bactrim [sulfamethoxazole-trimethoprim]; Macrodantin [nitrofurantoin macrocrystal]; Quinine derivatives; and Vasotec [enalapril]  Family History  Problem Relation Age of Onset  . Hypertension Mother   . Hyperlipidemia Mother   . Heart disease Father   . Heart attack Father 66  . Hypertension Father   . Hyperlipidemia Father   . Heart disease Brother        CABG   . Heart attack Brother   . Breast cancer Neg Hx     Social History Social History  Substance Use Topics  . Smoking status: Never Smoker  . Smokeless tobacco: Never Used  . Alcohol use No    Review of Systems Constitutional: No fever/chills.positive mechanical fall 2. Negative loss of consciousness. No general malaise or recent illness. Eyes: No visual changes.no blurred or double vision. ENT: No sore throat. No congestion or rhinorrhea. Cardiovascular: Denies chest pain. Denies palpitations. Respiratory: Denies shortness of breath.  No cough. Gastrointestinal: No abdominal pain.  No nausea, no vomiting.  No diarrhea.  No constipation. Genitourinary: Negative for dysuria. Musculoskeletal: Negative for back pain.negative for neck pain. Positive for left upper extremity bruising and pain. Skin: Negative for rash.positive for laceration to the top of the scalp. Neurological: Negative for headaches. No focal numbness, tingling or weakness.     ____________________________________________   PHYSICAL EXAM:  VITAL SIGNS: ED Triage Vitals  Enc Vitals Group     BP 08/16/17 0749 (!) 145/60     Pulse Rate 08/16/17 0749 76     Resp 08/16/17 0749 16     Temp 08/16/17 0749 98.4 F (36.9 C)     Temp Source 08/16/17 0749 Oral     SpO2 08/16/17 0749 95 %     Weight 08/16/17 0750 139 lb (63 kg)     Height  08/16/17 0750 4\' 11"  (1.499 m)     Head Circumference --      Peak Flow --      Pain Score 08/16/17 0749 7     Pain Loc --      Pain Edu? --      Excl. in [place]? --     Constitutional: Alert and oriented. hronically ill appearing but nontoxic.Marland Kitchen Answers questions appropriately.GCS is 15 Eyes: Conjunctivae are normal.  EOMI. PERRLA. No horizontal or vertical nystagmus. No raccoon eyes.No scleral icterus. Head: the patient has a large 20 cm C-shaped scalp hematoma to the galea on the top right scalp.No Battle sign. Nose: No congestion/rhinnorhea.no swelling over the nose or septal hematoma. Mouth/Throat: Mucous membranes are moist. No dental injury or malocclusion. Neck: No stridor.  Supple.  o midline C-spine tenderness to palpation, step-offs or deformities. Cardiovascular: Normal rate, regular rhythm. Significant 5 out of 5 holosystolic murmur, without rubs or gallops.  Respiratory: Normal respiratory effort.  No accessory muscle use or retractions. Lungs CTAB.  No wheezes, rales or ronchi. Gastrointestinal: morbidly obese.Soft, nontender and nondistended.  No guarding or rebound.  No peritoneal signs. Musculoskeletal: the patient has bruising of almost the entirety of the upper left upper  extremity from the shoulder to be elbow on the lateral aspect. She has a small abrasion without any evidence of infection or acute bleeding. The patient has full range of motion of the bilateral wrists, elbows, and shoulders with out pain. She has range of motion of the bilateral hips, knees, and ankles without pain. She has a stable pelvis. The patient has a thrill over her left upper extremity fistula that is normal. Neurologic:  A&Ox3.  Speech is clear.  Face and smile are symmetric.  EOMI.  Moves all extremities well. Skin:  Skin is warm, dry.See skin exam under [facility]: Mood and affect are normal. Speech and behavior are normal.  Normal judgement  ____________________________________________    LABS (all labs ordered are listed, but only abnormal results are displayed)  Labs Reviewed  CBC - Abnormal; Notable for the following:       Result Value   WBC 13.3 (*)    RBC 3.00 (*)    Hemoglobin 9.2 (*)    HCT 26.3 (*)    All other components within normal limits  BASIC METABOLIC PANEL - Abnormal; Notable for the following:    Chloride 97 (*)    Glucose, Bld 128 (*)    BUN 33 (*)    Creatinine, Ser 6.31 (*)    Calcium 8.5 (*)    GFR calc non Af Amer 6 (*)    GFR calc Af Amer 7 (*)    All other components within normal limits  TROPONIN I - Abnormal; Notable for the following:    Troponin I 0.03 (*)    All other components within normal limits  CK  TROPONIN I   ____________________________________________  EKG  ED ECG REPORT I, Eula Listen, the attending physician, personally viewed and interpreted this ECG.   Date: 08/16/2017  EKG Time: 746  Rate: 77  Rhythm: normal sinus rhythm  Axis: normal  Intervals:prolonged QTc  ST&T Change: No STEMI  ____________________________________________  RADIOLOGY  Ct Head Wo Contrast  Result Date: 08/16/2017 CLINICAL DATA:  Fall at home. Worsening bilateral lower extremity weakness. Head injury. Neck pain. EXAM: CT HEAD WITHOUT CONTRAST CT CERVICAL SPINE WITHOUT CONTRAST TECHNIQUE: Multidetector CT imaging of the head and cervical spine was performed following the standard protocol without intravenous contrast. Multiplanar CT image reconstructions of the cervical spine were also generated. COMPARISON:  07/30/2010 head CT. FINDINGS: CT HEAD FINDINGS Brain: No evidence of parenchymal hemorrhage or extra-axial fluid collection. No mass lesion, mass effect, or midline shift. No CT evidence of acute infarction. Two tiny right basal ganglia lacunes. Nonspecific moderate subcortical and periventricular white matter hypodensity, most in keeping with chronic small vessel ischemic change. No ventriculomegaly. Vascular: No acute  abnormality. Skull: No evidence of calvarial fracture. Sinuses/Orbits: Complete opacification of the left sphenoid sinus with associated left sphenoid sinus wall hyperostosis. Mucoperiosteal thickening in the right sphenoid sinus. No fluid levels. Other: Moderate scalp contusion with minimal subcutaneous emphysema at the vertex. The mastoid air cells are unopacified. CT CERVICAL SPINE FINDINGS Alignment: Straightening of the cervical spine. No subluxation. Dens is well positioned between the lateral masses of C1. Skull base and vertebrae: No acute fracture. No primary bone lesion or focal pathologic process. Soft tissues and spinal canal: No prevertebral fluid or swelling. No visible canal hematoma. Disc levels: Mild-to-moderate multilevel degenerative disc disease in the cervical spine, most prominent at C5-6. Moderate bilateral facet arthropathy. No significant degenerative foraminal stenosis. Upper chest: Negative. Other: Visualized mastoid air cells appear clear. Heterogeneous thyroid  gland without discrete nodules. No pathologically enlarged cervical nodes. IMPRESSION: 1. Moderate scalp contusion with minimal subcutaneous emphysema at the vertex. 2. No evidence of acute intracranial abnormality. No evidence of calvarial fracture. 3. Moderate chronic small vessel ischemic change. Two tiny right basal ganglia lacunes. 4. No cervical spine fracture or subluxation. 5. Mild-to-moderate degenerative changes in the cervical spine as detailed. Electronically Signed   By: Ilona Sorrel M.D.   On: 08/16/2017 09:15   Ct Cervical Spine Wo Contrast  Result Date: 08/16/2017 CLINICAL DATA:  Fall at home. Worsening bilateral lower extremity weakness. Head injury. Neck pain. EXAM: CT HEAD WITHOUT CONTRAST CT CERVICAL SPINE WITHOUT CONTRAST TECHNIQUE: Multidetector CT imaging of the head and cervical spine was performed following the standard protocol without intravenous contrast. Multiplanar CT image reconstructions of the  cervical spine were also generated. COMPARISON:  07/30/2010 head CT. FINDINGS: CT HEAD FINDINGS Brain: No evidence of parenchymal hemorrhage or extra-axial fluid collection. No mass lesion, mass effect, or midline shift. No CT evidence of acute infarction. Two tiny right basal ganglia lacunes. Nonspecific moderate subcortical and periventricular white matter hypodensity, most in keeping with chronic small vessel ischemic change. No ventriculomegaly. Vascular: No acute abnormality. Skull: No evidence of calvarial fracture. Sinuses/Orbits: Complete opacification of the left sphenoid sinus with associated left sphenoid sinus wall hyperostosis. Mucoperiosteal thickening in the right sphenoid sinus. No fluid levels. Other: Moderate scalp contusion with minimal subcutaneous emphysema at the vertex. The mastoid air cells are unopacified. CT CERVICAL SPINE FINDINGS Alignment: Straightening of the cervical spine. No subluxation. Dens is well positioned between the lateral masses of C1. Skull base and vertebrae: No acute fracture. No primary bone lesion or focal pathologic process. Soft tissues and spinal canal: No prevertebral fluid or swelling. No visible canal hematoma. Disc levels: Mild-to-moderate multilevel degenerative disc disease in the cervical spine, most prominent at C5-6. Moderate bilateral facet arthropathy. No significant degenerative foraminal stenosis. Upper chest: Negative. Other: Visualized mastoid air cells appear clear. Heterogeneous thyroid gland without discrete nodules. No pathologically enlarged cervical nodes. IMPRESSION: 1. Moderate scalp contusion with minimal subcutaneous emphysema at the vertex. 2. No evidence of acute intracranial abnormality. No evidence of calvarial fracture. 3. Moderate chronic small vessel ischemic change. Two tiny right basal ganglia lacunes. 4. No cervical spine fracture or subluxation. 5. Mild-to-moderate degenerative changes in the cervical spine as detailed.  Electronically Signed   By: Ilona Sorrel M.D.   On: 08/16/2017 09:15   Dg Humerus Left  Result Date: 08/16/2017 CLINICAL DATA:  Status post fall today. The patient is complaining of upper left lateral humeral pain and abrasion. EXAM: LEFT HUMERUS - 2+ VIEW COMPARISON:  None in PACs FINDINGS: The humerus is subjectively adequately mineralized. No acute fracture nor dislocation is observed. The overlying soft tissues exhibit no acute abnormalities. IMPRESSION: No acute fracture nor dislocation of the left humerus is observed. Electronically Signed   By: David  Martinique M.D.   On: 08/16/2017 09:27    ____________________________________________   PROCEDURES  Procedure(s) performed: None  Procedures  Critical Care performed: No ____________________________________________   INITIAL IMPRESSION / ASSESSMENT AND PLAN / ED COURSE  Pertinent labs & imaging results that were available during my care of the patient were reviewed by me and considered in my medical decision making (see chart for details).  71 y.o. Female, not anticoagulated, with ESRD on HD and multiple other chronic comorbidities presenting with 2 falls that were mechanical over the last 2 days. She denies any loss of consciousness. The  patient has a large scalp hematoma with laceration it will need repair. We'll get a CT scan of the head and neck to rule out any cranial or C-spine injury. I'm also concerned about the extent of the bruising in the left upper extremity will get x-ray imaging to evaluate for bony injury, and a CK to rule out rhabdomyolysis. Basic labs are also pending. The patient will be given hydrocodone, which she takes at home for pain,to treat her discomfort. Plan reevaluation for final disposition. The patient will receive a Tdap, as she does not remember if she has had one in the last 5 years.  ----------------------------------------- 9:38 AM on 08/16/2017 -----------------------------------------  The  patient's workup in the emergency department has been reassuring. She does not have any evidence of fracture in her left humerus, nor does she have any acute intracranial or C-spine injury from her fall. I will plan to repair her scalp laceration. Her laboratory studies are also reassuring, she has a chronic anemia which is unchanged. She does have a troponin of 0.03, but has no ischemic changes on her EKG and has not been experiencing any chest pain, shortness of breath or red flag symptoms. I will repeat this to ensure that it is stable, but anticipate discharge home.  LACERATION REPAIR Performed by: Eula Listen Authorized by: Eula Listen Consent: Verbal consent obtained. Risks and benefits: risks, benefits and alternatives were discussed Consent given by: patient Patient identity confirmed: provided demographic data Prepped and Draped in normal sterile fashion Wound explored  Laceration Location: scalp  Laceration Length: 25cm; Once blood clot was removed, the laceration was seemed to extend 5 additional centimeters.  No Foreign Bodies seen or palpated  Anesthesia: local infiltration  Local anesthetic: lidocaine 1% with epinephrine  Anesthetic total: 8 ml  Irrigation method: syringe Amount of cleaning: complicated with removal of clot and fat globules  Skin closure: staples  Number of sutures: 21  Technique: simple  Patient tolerance: Patient tolerated the procedure well with no immediate complications.  ____________________________________________  FINAL CLINICAL IMPRESSION(S) / ED DIAGNOSES  Final diagnoses:  Fall, initial encounter  Laceration of scalp, initial encounter  Hematoma of scalp, initial encounter  Traumatic ecchymosis of left upper arm, initial encounter         NEW MEDICATIONS STARTED DURING THIS VISIT:  New Prescriptions   No medications on file      Eula Listen, MD 08/16/17 1008

## 2017-08-16 NOTE — Therapy (Signed)
[facility] [address], [place], [address] Phone: [phone]   Fax:  [phone]  Physical Therapy Treatment  Patient Details  Name: Phyllis [name] MRN: [id number] Date of Birth: 1945-11-22 Referring Provider: Azzie Glatter MD  Encounter Date: 08/15/2017      PT End of Session - 08/15/17 1700    Visit Number 7   Number of Visits 8   Date for PT Re-Evaluation 09/12/17   Authorization Type 7   Authorization Time Period 10 (G-code)   PT Start Time 1515   PT Stop Time 1548   PT Time Calculation (min) 33 min   Activity Tolerance Patient tolerated treatment well   Behavior During Therapy [facility] for tasks assessed/performed      Past Medical History:  Diagnosis Date  . Allergy   . Asthma   . Chronic kidney disease   . COPD (chronic obstructive pulmonary disease) ([place])   . Diabetes mellitus without complication ([place])   . Dialysis patient ([place])    3 days every week.   . Dialysis patient ([place])   . Fistula    lower left arm  . GERD (gastroesophageal reflux disease)   . Gout   . Hypertension   . 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 Sleep apnea     Past Surgical History:  Procedure Laterality Date  . carpel tunnel    . GALLBLADDER SURGERY    . PERIPHERAL VASCULAR CATHETERIZATION N/A 04/12/2015   Procedure: A/V Shuntogram/Fistulagram;  Surgeon: Algernon Huxley, MD;  Location: [facility];  Service: Cardiovascular;  Laterality: N/A;  . PERIPHERAL VASCULAR CATHETERIZATION N/A 04/12/2015   Procedure: A/V Shunt Intervention;  Surgeon: Algernon Huxley, MD;  Location: [facility];  Service: Cardiovascular;  Laterality: N/A;  . PERIPHERAL VASCULAR CATHETERIZATION N/A 06/09/2015   Procedure: Dialysis/Perma Catheter Removal;  Surgeon: Katha Cabal, MD;  Location: [facility];  Service: Cardiovascular;  Laterality: N/A;    There were no vitals  filed for this visit.      Subjective Assessment - 08/15/17 1540    Subjective Patient reports she fell this morning due to tripping on a shoe in her room. She required assistance to get up and had to call EMS. She did not go to emergency room and is able to move and walk without increased pain. She reports she fell to her left side and has bruises on arm and side and thigh.   Pertinent History Pain in her back is chronic and has worsened over the past year. Patient reports history of kidney problems and is on dialysis currently since last year, 2x/week. Plantar fascitis within the past year with injections within the past year. also reports she has had back pain intermittently with activity over the past >10 years.    Limitations House hold activities;Other (comment);Lifting  doing crafts, wood working, stained glass   How long can you sit comfortably? as long as she wants    How long can you stand comfortably? 30 min or less   How long can you walk comfortably? unable to walk long distances   Diagnostic tests MRI   Patient Stated Goals less back pain to allow her to do house keeping chores and do wood working, stained glass with less back pain      Objective: Gait: ambulating with SPC independently on arrival to clinic Observation: ecchymosis along left forearm and  upper arm: able to actively move UE's through full ROM without complaints of pain; LE's hip, knees and ankle WNL active motion without complaints of pain  Treatment: Therapeutic exercise: patient performed exercises with verbal, tactile cues and demonstration of therapist: Goal: improve MODI; independent with home program Sitting: (with moist heat applied to back during exercises and ice pack applied to left UE with UE supported on pillow) no adverse reactions noted Hip adduction with ball and glute sets x 10 reps Hip abduction with x 10 reps with VC Knee extension alternating LE's x 10  Ankle DF and PF active x 10 each Ball  under foot; roll back and forth with knee flexion/extension x 25 reps   Patient response to treatment: Patient performed all exercises with minimal cuing and no reports of increased pain; improved gait and able to ambulate with less difficulty at end of session.                   PT Education - 08/15/17 1543    Education provided Yes   Education Details exercise instruction; use of heat/ice to decreased soreness in back/UE   Person(s) Educated Patient   Methods Explanation   Comprehension Verbalized understanding             PT Long Term Goals - 07/25/17 1552      PT LONG TERM GOAL #1   Title Patient will demonstrate improved function with walking, standing and use of UE's with MODI score of <20%    Baseline MODI 36%   Status Revised   Target Date 09/12/17     PT LONG TERM GOAL #2   Title Patient will be independent with home program to continue with strength, balance/endurance by discharge to continue self management   Baseline limited knowledge of appropriate exercises and progression to improve function   Status Revised   Target Date 09/12/17               Plan - 08/15/17 1706    Clinical Impression Statement Patient was able to perform all exercises with minimal cuing and no difficulty. She reported no increased pain during session. She will perform active exercises until next session and then resume strengthening as able.    Rehab Potential Fair   Clinical Impairments Affecting Rehab Potential (+)motivated(-)chronic condition, comorbidities, age   PT Frequency 2x / week   PT Duration 6 weeks   PT Treatment/Interventions Patient/family education;Electrical Stimulation;Cryotherapy;Moist Heat;Neuromuscular re-education;Therapeutic exercise;Manual techniques   PT Next Visit Plan therapeutic exercise   PT Home Exercise Plan scapular retraction, posture awareness, body mechanics      Patient will benefit from skilled therapeutic intervention in  order to improve the following deficits and impairments:  Decreased strength, Impaired perceived functional ability, Decreased activity tolerance, Decreased endurance, Pain, Increased muscle spasms, Impaired UE functional use  Visit Diagnosis: Muscle weakness (generalized)  Difficulty in walking, not elsewhere classified  Chronic midline low back pain without sciatica     Problem List Patient Active Problem List   Diagnosis Date Noted  . Chest discomfort 08/06/2015  . Hyperlipidemia 08/06/2015  . Complication from renal dialysis device 04/12/2015  . SOB (shortness of breath) 02/01/2015  . End stage renal disease ([place]) 02/01/2015  . Dependence on hemodialysis ([place]) 02/01/2015  . Type 2 diabetes mellitus with other specified complication ([place]) 02/40/9735  . Asthma 02/01/2015  . Chronic diastolic CHF (congestive heart failure) ([place]) 02/01/2015    Jomarie Longs PT 08/16/2017, 7:28 PM  [place]  [facility] [address] [place], [address] Phone: [phone]   Fax:  [phone]  Name: JULIETH TUGMAN MRN: [id number] Date of Birth: 06/29/1946

## 2017-08-16 NOTE — ED Notes (Signed)
PT dressed and verbalized understanding of wait for EMS to transport pt home. PT and pts partner at bedside. NAD at this time. Bleeding from laceration controlled at this time.

## 2017-08-16 NOTE — ED Notes (Signed)
Patient returned from CT and x-ray.  Cardiac monitor applied.  VSS.

## 2017-08-16 NOTE — ED Triage Notes (Addendum)
Patient brought in via [facility] for fall at home.  Patient reports being treated by physical therapy for bilateral weakness in legs that has gotten progressively worse.  Yesterday patient tripped and fell over her shoes but did not come to ER.  Today patient was trying to put foot portion of recliner chair back down when she reports losing her balance and falling.  Patient fell and hit head and has sustained large hematoma to top medial portion of head.  Currently bleeding is controlled.  Patient denies dizziness prior to fall and denies loss of consciousness.  Patient denies being on blood thinners.  Patient is alert and oriented. Large purple/blue bruise noted to left upper arm, patient reports that bruise is from fall yesterday but only painful when touched.  Best friend at bedside.

## 2017-08-16 NOTE — ED Notes (Signed)
PT taken out with EMS. Pts roommate left with patient. PT in no acute distress. Bleeding under control and no neuro deficits noted. Pt given new pain prescription to hold her over until PCP can rewrite prescription. Pt given discharge papers and reports understanding of follow up care.

## 2017-08-16 NOTE — ED Notes (Signed)
Patient transported to CT 

## 2017-08-16 NOTE — Discharge Instructions (Signed)
PLEASE USE YOUR CANE AT ALL TIMES WHEN YOU ARE WALKING.  You may shower with your staples in place but do not soak your head. Please make sure your monitoring your laceration; you may notice some mild bleeding or clear drainage but seek immediate attention if you develop significant swelling, pain, warmth, redness, or pus drainage.  Please have the medical staff at dialysis check your wound on Saturday to look for any signs of infection. Please make an appointment with your primary care physician for staple removal and wound recheck in 5-7 days.  Return to the emergency department if you develop severe pain, chest pain, shortness of breath, signs of infection, fever, lightheadedness or fainting, or any other symptoms concerning to you.

## 2017-08-22 ENCOUNTER — Encounter: Payer: PRIVATE HEALTH INSURANCE | Admitting: Physical Therapy

## 2017-08-27 ENCOUNTER — Encounter: Payer: PRIVATE HEALTH INSURANCE | Admitting: Physical Therapy

## 2017-08-27 ENCOUNTER — Ambulatory Visit: Payer: Medicare Other | Admitting: Physical Therapy

## 2017-08-29 ENCOUNTER — Encounter: Payer: PRIVATE HEALTH INSURANCE | Admitting: Physical Therapy

## 2017-09-05 ENCOUNTER — Encounter: Payer: Self-pay | Admitting: Physical Therapy

## 2017-09-05 ENCOUNTER — Ambulatory Visit: Payer: Medicare Other | Attending: Internal Medicine | Admitting: Physical Therapy

## 2017-09-05 DIAGNOSIS — G8929 Other chronic pain: Secondary | ICD-10-CM

## 2017-09-05 DIAGNOSIS — M545 Low back pain: Secondary | ICD-10-CM | POA: Insufficient documentation

## 2017-09-05 DIAGNOSIS — R262 Difficulty in walking, not elsewhere classified: Secondary | ICD-10-CM | POA: Diagnosis present

## 2017-09-05 DIAGNOSIS — M6281 Muscle weakness (generalized): Secondary | ICD-10-CM | POA: Diagnosis present

## 2017-09-06 NOTE — Therapy (Signed)
[facility] [address], [place], [address] Phone: [phone]   Fax:  [phone]  Physical Therapy Treatment  Patient Details  Name: Phyllis [name] MRN: [id number] Date of Birth: 1946/11/12 Referring Provider: Azzie Glatter MD  Encounter Date: 09/05/2017      PT End of Session - 09/05/17 1535    Visit Number 8   Number of Visits 16   Date for PT Re-Evaluation 09/12/17   Authorization Type 8   Authorization Time Period 10 (G-code)   PT Start Time 1458   PT Stop Time 1528   PT Time Calculation (min) 30 min   Activity Tolerance Patient tolerated treatment well   Behavior During Therapy [facility] for tasks assessed/performed      Past Medical History:  Diagnosis Date  . Allergy   . Asthma   . Chronic kidney disease   . COPD (chronic obstructive pulmonary disease) ([place])   . Diabetes mellitus without complication ([place])   . Dialysis patient ([place])    3 days every week.   . Dialysis patient ([place])   . Fistula    lower left arm  . GERD (gastroesophageal reflux disease)   . Gout   . Hypertension   . 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 Sleep apnea     Past Surgical History:  Procedure Laterality Date  . carpel tunnel    . GALLBLADDER SURGERY    . PERIPHERAL VASCULAR CATHETERIZATION N/A 04/12/2015   Procedure: A/V Shuntogram/Fistulagram;  Surgeon: Algernon Huxley, MD;  Location: [facility];  Service: Cardiovascular;  Laterality: N/A;  . PERIPHERAL VASCULAR CATHETERIZATION N/A 04/12/2015   Procedure: A/V Shunt Intervention;  Surgeon: Algernon Huxley, MD;  Location: [facility];  Service: Cardiovascular;  Laterality: N/A;  . PERIPHERAL VASCULAR CATHETERIZATION N/A 06/09/2015   Procedure: Dialysis/Perma Catheter Removal;  Surgeon: Katha Cabal, MD;  Location: [facility];  Service: Cardiovascular;  Laterality: N/A;    There were no vitals  filed for this visit.      Subjective Assessment - 09/05/17 1504    Subjective Patient reports she fell 08/16/2017 and hit her head and was seen in ED treated for laceration of head and had CT of head and X ray of left arm with report of negative for fx. Today she is returning for exercises for strengthening.    Pertinent History Pain in her back is chronic and has worsened over the past year. Patient reports history of kidney problems and is on dialysis currently since last year, 2x/week. Plantar fascitis within the past year with injections within the past year. also reports she has had back pain intermittently with activity over the past >10 years.    Limitations House hold activities;Other (comment);Lifting  doing crafts, wood working, stained glass   How long can you sit comfortably? as long as she wants    How long can you stand comfortably? 30 min or less   How long can you walk comfortably? unable to walk long distances   Diagnostic tests MRI   Patient Stated Goals less back pain to allow her to do house keeping chores and do wood working, stained glass with less back pain   Currently in Pain? No/denies        Objective: Gait: ambulating with SPC with small base  For support, using significant other for support Observation: ecchymosis along left forearm and upper  arm: increased firm area lateral aspect of shoulder; encouraged patient to contact MD to have this area further evaluated Palpation: increased warmth lateral aspect of left shoulder  Treatment: Therapeutic exercise: patient performed exercises with verbal, tactile cues and demonstration of therapist: Goal: improve MODI; independent with home program Sitting:  Hip adduction with ball and glute sets x 10 reps Hip abduction x 10 reps with red resistive band then single leg abduction x 10 reps Knee extension with 2# on ankles alternating LE's 2 x 10  Ankle DF with red resistive band 2 x 10 each PF off balance stones 2 x 15  reps, with 4# weight on thighs  Patient response to treatment: patient demonstrated improved technique with exercises with minimal VC for correct alignment. Patient required assistance to complete exercises with correct technique. Decreased strength and endurance noted with left LE as compared to right          PT Education - 09/05/17 1530    Education provided Yes   Education Details exercise instruction  and  plan of progression of exercises   Person(s) Educated Patient   Methods Explanation;Demonstration;Verbal cues   Comprehension Verbalized understanding;Returned demonstration;Verbal cues required             PT Long Term Goals - 07/25/17 1552      PT LONG TERM GOAL #1   Title Patient will demonstrate improved function with walking, standing and use of UE's with MODI score of <20%    Baseline MODI 36%   Status Revised   Target Date 09/12/17     PT LONG TERM GOAL #2   Title Patient will be independent with home program to continue with strength, balance/endurance by discharge to continue self management   Baseline limited knowledge of appropriate exercises and progression to improve function   Status Revised   Target Date 09/12/17               Plan - 09/05/17 1535    Clinical Impression Statement Patient demonstrated good  technique and  able to perform exercises with moderate assistance of therapist. She continues with decreased strength left LE and was limited in UE exercises due to recent fall. she will benefit from continue physical therapy intervention to address limitations and achieve maximal function.    Rehab Potential Fair   Clinical Impairments Affecting Rehab Potential (+)motivated(-)chronic condition, comorbidities, age   PT Frequency 2x / week   PT Duration 6 weeks   PT Treatment/Interventions Patient/family education;Electrical Stimulation;Cryotherapy;Moist Heat;Neuromuscular re-education;Therapeutic exercise;Manual techniques   PT Next Visit  Plan therapeutic exercise   PT Home Exercise Plan scapular retraction, posture awareness, body mechanics      Patient will benefit from skilled therapeutic intervention in order to improve the following deficits and impairments:  Decreased strength, Impaired perceived functional ability, Decreased activity tolerance, Decreased endurance, Pain, Increased muscle spasms, Impaired UE functional use  Visit Diagnosis: Muscle weakness (generalized)  Difficulty in walking, not elsewhere classified  Chronic midline low back pain without sciatica     Problem List Patient Active Problem List   Diagnosis Date Noted  . Chest discomfort 08/06/2015  . Hyperlipidemia 08/06/2015  . Complication from renal dialysis device 04/12/2015  . SOB (shortness of breath) 02/01/2015  . End stage renal disease ([place]) 02/01/2015  . 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 Type 2 diabetes mellitus with other specified complication ([place]) 02/63/7858  . Asthma 02/01/2015  . Chronic diastolic CHF (congestive heart failure) ([place]) 02/01/2015    Jomarie Longs  PT 09/06/2017, 9:43 PM  [facility] [address], [place], [address] Phone: [phone]   Fax:  [phone]  Name: TZIVIA ONEIL MRN: [id number] Date of Birth: [birth year]

## 2017-09-11 ENCOUNTER — Encounter: Payer: Self-pay | Admitting: Intensive Care

## 2017-09-11 ENCOUNTER — Emergency Department: Payer: Medicare Other

## 2017-09-11 ENCOUNTER — Emergency Department
Admission: EM | Admit: 2017-09-11 | Discharge: 2017-09-11 | Disposition: A | Discharge status: Home / Self Care | Payer: Medicare Other | Attending: Emergency Medicine | Admitting: Emergency Medicine

## 2017-09-11 DIAGNOSIS — J189 Pneumonia, unspecified organism: Secondary | ICD-10-CM

## 2017-09-11 DIAGNOSIS — Z79899 Other long term (current) drug therapy: Secondary | ICD-10-CM | POA: Insufficient documentation

## 2017-09-11 DIAGNOSIS — J449 Chronic obstructive pulmonary disease, unspecified: Secondary | ICD-10-CM | POA: Diagnosis not present

## 2017-09-11 DIAGNOSIS — N186 End stage renal disease: Secondary | ICD-10-CM | POA: Diagnosis not present

## 2017-09-11 DIAGNOSIS — I132 Hypertensive heart and chronic kidney disease with heart failure and with stage 5 chronic kidney disease, or end stage renal disease: Secondary | ICD-10-CM | POA: Insufficient documentation

## 2017-09-11 DIAGNOSIS — I5032 Chronic diastolic (congestive) heart failure: Secondary | ICD-10-CM | POA: Insufficient documentation

## 2017-09-11 DIAGNOSIS — Z992 Dependence on renal dialysis: Secondary | ICD-10-CM | POA: Diagnosis not present

## 2017-09-11 DIAGNOSIS — R0602 Shortness of breath: Secondary | ICD-10-CM | POA: Diagnosis present

## 2017-09-11 DIAGNOSIS — E1122 Type 2 diabetes mellitus with diabetic chronic kidney disease: Secondary | ICD-10-CM | POA: Diagnosis not present

## 2017-09-11 DIAGNOSIS — J181 Lobar pneumonia, unspecified organism: Secondary | ICD-10-CM

## 2017-09-11 LAB — COMPREHENSIVE METABOLIC PANEL
ALT: 8 U/L — ABNORMAL LOW (ref 14–54)
AST: 15 U/L (ref 15–41)
Albumin: 3.8 g/dL (ref 3.5–5.0)
Alkaline Phosphatase: 64 U/L (ref 38–126)
Anion gap: 13 (ref 5–15)
BUN: 28 mg/dL — ABNORMAL HIGH (ref 6–20)
CO2: 26 mmol/L (ref 22–32)
Calcium: 8.7 mg/dL — ABNORMAL LOW (ref 8.9–10.3)
Chloride: 102 mmol/L (ref 101–111)
Creatinine, Ser: 6.32 mg/dL — ABNORMAL HIGH (ref 0.44–1.00)
GFR calc Af Amer: 7 mL/min — ABNORMAL LOW (ref >60)
GFR calc non Af Amer: 6 mL/min — ABNORMAL LOW (ref >60)
Glucose, Bld: 118 mg/dL — ABNORMAL HIGH (ref 65–99)
Potassium: 3.4 mmol/L — ABNORMAL LOW (ref 3.5–5.1)
Sodium: 141 mmol/L (ref 135–145)
Total Bilirubin: 0.8 mg/dL (ref 0.3–1.2)
Total Protein: 7.2 g/dL (ref 6.5–8.1)

## 2017-09-11 LAB — CBC WITH DIFFERENTIAL/PLATELET
Basophils Absolute: 0.1 10*3/uL (ref 0–0.1)
Basophils Relative: 1 %
Eosinophils Absolute: 0.4 10*3/uL (ref 0–0.7)
Eosinophils Relative: 5 %
HCT: 28.1 % — ABNORMAL LOW (ref 35.0–47.0)
Hemoglobin: 9.2 g/dL — ABNORMAL LOW (ref 12.0–16.0)
Lymphocytes Relative: 12 %
Lymphs Abs: 1.1 10*3/uL (ref 1.0–3.6)
MCH: 29.8 pg (ref 26.0–34.0)
MCHC: 32.8 g/dL (ref 32.0–36.0)
MCV: 90.7 fL (ref 80.0–100.0)
Monocytes Absolute: 0.6 10*3/uL (ref 0.2–0.9)
Monocytes Relative: 6 %
Neutro Abs: 6.8 10*3/uL — ABNORMAL HIGH (ref 1.4–6.5)
Neutrophils Relative %: 76 %
Platelets: 200 10*3/uL (ref 150–440)
RBC: 3.1 MIL/uL — ABNORMAL LOW (ref 3.80–5.20)
RDW: 14.3 % (ref 11.5–14.5)
WBC: 8.8 10*3/uL (ref 3.6–11.0)

## 2017-09-11 LAB — BRAIN NATRIURETIC PEPTIDE: B Natriuretic Peptide: 664 pg/mL — ABNORMAL HIGH (ref 0.0–100.0)

## 2017-09-11 MED ORDER — ALBUTEROL SULFATE (2.5 MG/3ML) 0.083% IN NEBU
5.0000 mg | INHALATION_SOLUTION | Freq: Once | RESPIRATORY_TRACT | Status: AC
  Administered 2017-09-11: 5 mg via RESPIRATORY_TRACT
  Filled 2017-09-11: qty 6

## 2017-09-11 MED ORDER — LEVOFLOXACIN 750 MG PO TABS
750.0000 mg | ORAL_TABLET | Freq: Once | ORAL | Status: AC
  Administered 2017-09-11: 750 mg via ORAL
  Filled 2017-09-11: qty 1

## 2017-09-11 MED ORDER — LEVOFLOXACIN 500 MG PO TABS: 500.0000 mg | ORAL_TABLET | ORAL | 0 refills | Status: DC

## 2017-09-11 NOTE — Discharge Instructions (Signed)
Your chest xray shows increased fluid in your lungs which will be improved by your dialysis.  You also have a small pneumonia.  Take Levaquin as prescribed to treat this infection.

## 2017-09-11 NOTE — ED Provider Notes (Signed)
[facility] Emergency Department Provider Note  ____________________________________________  Time seen: Approximately 9:11 AM  I have reviewed the triage vital signs and the nursing notes.   HISTORY  Chief Complaint Shortness of Breath    HPI Phyllis [name] is a 71 y.o. female who complains of shortness of breath for the past 3 days. Improves with her inhalers at home, but today just seemed worse. Positive nonproductive cough. She is dialysis dependent and goes all her sessions and has not had any issues with them, but does note that she does not adhere to the dialysis diet as she should. Shortness of breath is intermittent, no aggravating factors. Not associated with pain. Moderate intensity.     Past Medical History:  Diagnosis Date  . Allergy   . Asthma   . Chronic kidney disease   . COPD (chronic obstructive pulmonary disease) ([place])   . Diabetes mellitus without complication ([place])   . Dialysis patient ([place])    3 days every week.   . Dialysis patient ([place])   . Fistula    lower left arm  . GERD (gastroesophageal reflux disease)   . Gout   . Hypertension   . 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 Sleep apnea      Patient Active Problem List   Diagnosis Date Noted  . Chest discomfort 08/06/2015  . Hyperlipidemia 08/06/2015  . Complication from renal dialysis device 04/12/2015  . SOB (shortness of breath) 02/01/2015  . End stage renal disease ([place]) 02/01/2015  . 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 Type 2 diabetes mellitus with other specified complication ([place]) 34/28/7681  . Asthma 02/01/2015  . Chronic diastolic CHF (congestive heart failure) ([place]) 02/01/2015     Past Surgical History:  Procedure Laterality Date  . carpel tunnel    . GALLBLADDER SURGERY    . PERIPHERAL VASCULAR CATHETERIZATION N/A 04/12/2015   Procedure: A/V Shuntogram/Fistulagram;  Surgeon: Algernon Huxley, MD;  Location: [place] [facility];  Service: Cardiovascular;  Laterality: N/A;  . PERIPHERAL VASCULAR CATHETERIZATION N/A 04/12/2015   Procedure: A/V Shunt Intervention;  Surgeon: Algernon Huxley, MD;  Location: [place] [facility];  Service: Cardiovascular;  Laterality: N/A;  . PERIPHERAL VASCULAR CATHETERIZATION N/A 06/09/2015   Procedure: Dialysis/Perma Catheter Removal;  Surgeon: Katha Cabal, MD;  Location: [place] [facility];  Service: Cardiovascular;  Laterality: N/A;     Prior to Admission medications   Medication Sig Start Date End Date Taking? Authorizing Provider  allopurinol (ZYLOPRIM) 100 MG tablet Take 100 mg by mouth 2 (two) times daily.  01/14/14   [provider]  amLODipine (NORVASC) 10 MG tablet Take 10 mg by mouth.  12/29/13   [provider]  calcium acetate, Phos Binder, (PHOSLYRA) 667 MG/5ML SOLN Take by mouth 3 (three) times daily with meals.    [provider]  carvedilol (COREG) 12.5 MG tablet Take 12.5 mg by mouth 2 (two) times daily with a meal.  01/09/14   [provider]  cetirizine (ZYRTEC) 10 MG tablet Take 10 mg by mouth as needed for allergies.    [provider]  cyclobenzaprine (FLEXERIL) 10 MG tablet Take 10 mg by mouth 3 (three) times daily as needed for muscle spasms.    [provider]  docusate sodium (COLACE) 100 MG capsule Take 100 mg by mouth 3 (three) times daily as needed for mild constipation.    [provider]  HYDROcodone-acetaminophen (  NORCO) 5-325 MG tablet Take 1 tablet by mouth every 6 (six) hours as needed for moderate pain or severe pain. 08/16/17   Eula Listen, MD  ipratropium-albuterol (DUONEB) 0.5-2.5 (3) MG/3ML SOLN Inhale into the lungs.    [provider]  levofloxacin (LEVAQUIN) 500 MG tablet Take 1 tablet (500 mg total) by mouth every other day. 09/13/17 09/18/17  Carrie Mew, MD  Lidocaine HCl 4 % GEL Apply 1 Application topically once daily as needed. 03/26/17 03/26/18   [provider]  mometasone (NASONEX) 50 MCG/ACT nasal spray Place 2 sprays into the nose daily.    [provider]  omeprazole (PRILOSEC) 20 MG capsule Take 20 mg by mouth 2 (two) times daily before a meal.    [provider]  pravastatin (PRAVACHOL) 40 MG tablet Take 40 mg by mouth daily.  12/29/13   [provider]  senna (SENOKOT) 8.6 MG tablet Take 1 tablet by mouth daily.    [provider]  topiramate (TOPAMAX) 25 MG tablet Take 25 mg by mouth as needed.  01/14/14   [provider]  VESICARE 5 MG tablet Take 5 mg by mouth daily.  11/11/13   [provider]  Vitamin D, Ergocalciferol, (DRISDOL) 50000 UNITS CAPS capsule Take 50,000 Units by mouth every 7 (seven) days.  12/29/13   [provider]     Allergies Codeine; Enalapril maleate; Nitrofurantoin; Sulfamethoxazole-trimethoprim; Vicodin [hydrocodone-acetaminophen]; 2,4-d dimethylamine (amisol); Baclofen; Neosporin [neomycin-bacitracin zn-polymyx]; Quinine; Ultram [tramadol]; Zocor [simvastatin]; Bactrim [sulfamethoxazole-trimethoprim]; Macrodantin [nitrofurantoin macrocrystal]; Quinine derivatives; and Vasotec [enalapril]   Family History  Problem Relation Age of Onset  . Hypertension Mother   . Hyperlipidemia Mother   . Heart disease Father   . Heart attack Father 44  . Hypertension Father   . Hyperlipidemia Father   . Heart disease Brother        CABG   . Heart attack Brother   . Breast cancer Neg Hx     Social History Social History  Substance Use Topics  . Smoking status: Never Smoker  . Smokeless tobacco: Never Used  . Alcohol use No    Review of Systems  Constitutional:   No fever or chills.  ENT:   No sore throat. No rhinorrhea. Cardiovascular:   No chest pain or syncope. Respiratory:   Positive shortness of breath and nonproductive cough. Gastrointestinal:   Negative for abdominal pain, vomiting and diarrhea.  Musculoskeletal:  Mild  bilateral leg swelling All other systems reviewed and are negative except as documented above in ROS and HPI.  ____________________________________________   PHYSICAL EXAM:  VITAL SIGNS: ED Triage Vitals  Enc Vitals Group     BP 09/11/17 0734 (!) 148/92     Pulse Rate 09/11/17 0734 92     Resp 09/11/17 0734 18     Temp 09/11/17 0734 98.1 F (36.7 C)     Temp src --      SpO2 09/11/17 0734 92 %     Weight 09/11/17 0736 139 lb (63 kg)     Height 09/11/17 0736 4\' 11"  (1.499 m)     Head Circumference --      Peak Flow --      Pain Score 09/11/17 0733 2     Pain Loc --      Pain Edu? --      Excl. in [place]? --   Oxygen saturation 96% on room air  Vital signs reviewed, nursing assessments reviewed.   Constitutional:   Alert and  oriented. Well appearing and in no distress. Eyes:   No scleral icterus.  EOMI. No nystagmus. No conjunctival pallor. PERRL. ENT   Head:   Normocephalic and atraumatic.   Nose:   No congestion/rhinnorhea.    Mouth/Throat:   MMM, no pharyngeal erythema. No peritonsillar mass.    Neck:   No meningismus. Full ROM. No JVD Hematological/Lymphatic/Immunilogical:   No cervical lymphadenopathy. Cardiovascular:   RRR. Symmetric bilateral radial and DP pulses.  No murmurs.  Respiratory:   Normal respiratory effort without tachypnea/retractions. Good air entry in all lung fields. Right basilar crackles. No wheezing Gastrointestinal:   Soft and nontender. Non distended. There is no CVA tenderness.  No rebound, rigidity, or guarding. Genitourinary:   deferred Musculoskeletal:   Normal range of motion in all extremities. No joint effusions.  No lower extremity tenderness.  1+ doughy pitting edema erythema or inflammatory changes. No calf tenderness. Neurologic:   Normal speech and language.  Motor grossly intact. No gross focal neurologic deficits are appreciated.  Skin:    Skin is warm, dry and intact. No rash noted.  No petechiae, purpura, or  bullae.  ____________________________________________    LABS (pertinent positives/negatives) (all labs ordered are listed, but only abnormal results are displayed) Labs Reviewed  CBC WITH DIFFERENTIAL/PLATELET - Abnormal; Notable for the following:       Result Value   RBC 3.10 (*)    Hemoglobin 9.2 (*)    HCT 28.1 (*)    Neutro Abs 6.8 (*)    All other components within normal limits  BRAIN NATRIURETIC PEPTIDE - Abnormal; Notable for the following:    B Natriuretic Peptide 664.0 (*)    All other components within normal limits  COMPREHENSIVE METABOLIC PANEL - Abnormal; Notable for the following:    Potassium 3.4 (*)    Glucose, Bld 118 (*)    BUN 28 (*)    Creatinine, Ser 6.32 (*)    Calcium 8.7 (*)    ALT 8 (*)    GFR calc non Af Amer 6 (*)    GFR calc Af Amer 7 (*)    All other components within normal limits   ____________________________________________   EKG  Interpreted by me Sinus rhythm rate of 90, normal axis and intervals. Normal QRS ST segments and T waves.  ____________________________________________    [id number]  Dg Chest 2 View  Result Date: 09/11/2017 CLINICAL DATA:  Shortness of Breath EXAM: CHEST  2 VIEW COMPARISON:  December 04, 2014 FINDINGS: There is interstitial pulmonary edema. No airspace consolidation. Heart is mildly enlarged with mild pulmonary venous hypertension. There is aortic atherosclerosis. No adenopathy. Bones are osteoporotic. IMPRESSION: Interstitial pulmonary edema. No airspace consolidation. Mild cardiomegaly with pulmonary venous hypertension consistent with a degree of pulmonary vascular congestion. There is aortic atherosclerosis. Bones osteoporotic. Aortic Atherosclerosis (ICD10-I70.0). Electronically Signed   By: Lowella Grip III M.D.   On: 09/11/2017 08:06    ____________________________________________   PROCEDURES Procedures  ____________________________________________   DIFFERENTIAL DIAGNOSIS  Pneumonia,  pneumothorax, COPD exacerbation, pulmonary edema  CLINICAL IMPRESSION / ASSESSMENT AND PLAN / ED COURSE  Pertinent labs & imaging results that were available during my care of the patient were reviewed by me and considered in my medical decision making (see chart for details).   Patient well-appearing no acute distress, unremarkable vital signs, presents with shortness of breath and cough. Well appearing. Suspicion for PE dissection or ACS. Not septic. Check labs and chest x-ray. Albuterol trial.  Clinical Course as of Oct  [id number]  Tue Sep 11, 2017  0859 Rad report of cxr c/w mild pulm edema / volume overload. This matches with 1+ doughy peripheral edema on exam and will be approp. Treated with her routing dialysis today at noon.  On my view of CXR images, there also appears to be a hazy opacity on lateral film in the retro-diaphragmatic space matching the R base crackles. I will treat her for CAP with levaquin while planning for discharge to continue her dialysis.  She is suitable for outpatient follow up with nephro and PCP. Return precautions discused. Low susp. Acs, pe, dissection, aaa, SSTI, sepsis, or resp failure.  DG Chest 2 View [PS]    Clinical Course User Index [PS] Carrie Mew, MD     ----------------------------------------- 9:15 AM on 09/11/2017 -----------------------------------------  Patient feeling fine, oxygenation stable without supplemental O2. Chest x-ray consistent with mild edema as well as a small infiltrate consistent with an early community-acquired pneumonia. Started on Levaquin, follow up with primary care, continue outpatient dialysis as scheduled today which will help with the edema.  ____________________________________________   FINAL CLINICAL IMPRESSION(S) / ED DIAGNOSES    Final diagnoses:  Shortness of breath  Community acquired pneumonia of right lower lobe of lung ([place])  End stage renal disease ([place])      New Prescriptions    LEVOFLOXACIN (LEVAQUIN) 500 MG TABLET    Take 1 tablet (500 mg total) by mouth every other day.     Portions of this note were generated with dragon dictation software. Dictation errors may occur despite best attempts at proofreading.    Carrie Mew, MD 09/11/17 [phone]

## 2017-09-11 NOTE — ED Notes (Signed)
ED Provider at bedside explaining discharge

## 2017-09-12 ENCOUNTER — Ambulatory Visit: Payer: Medicare Other | Admitting: Physical Therapy

## 2017-09-14 ENCOUNTER — Encounter: Payer: Self-pay | Admitting: Emergency Medicine

## 2017-09-14 ENCOUNTER — Emergency Department
Admission: EM | Admit: 2017-09-14 | Discharge: 2017-09-15 | Disposition: A | Discharge status: Home / Self Care | Payer: Medicare Other | Attending: Emergency Medicine | Admitting: Emergency Medicine

## 2017-09-14 ENCOUNTER — Emergency Department: Payer: Medicare Other

## 2017-09-14 DIAGNOSIS — T7840XA Allergy, unspecified, initial encounter: Secondary | ICD-10-CM | POA: Diagnosis not present

## 2017-09-14 DIAGNOSIS — N186 End stage renal disease: Secondary | ICD-10-CM | POA: Insufficient documentation

## 2017-09-14 DIAGNOSIS — J45909 Unspecified asthma, uncomplicated: Secondary | ICD-10-CM | POA: Diagnosis not present

## 2017-09-14 DIAGNOSIS — I5032 Chronic diastolic (congestive) heart failure: Secondary | ICD-10-CM | POA: Diagnosis not present

## 2017-09-14 DIAGNOSIS — T368X5A Adverse effect of other systemic antibiotics, initial encounter: Secondary | ICD-10-CM | POA: Diagnosis not present

## 2017-09-14 DIAGNOSIS — I132 Hypertensive heart and chronic kidney disease with heart failure and with stage 5 chronic kidney disease, or end stage renal disease: Secondary | ICD-10-CM | POA: Insufficient documentation

## 2017-09-14 DIAGNOSIS — Z79899 Other long term (current) drug therapy: Secondary | ICD-10-CM | POA: Diagnosis not present

## 2017-09-14 DIAGNOSIS — R259 Unspecified abnormal involuntary movements: Secondary | ICD-10-CM | POA: Diagnosis present

## 2017-09-14 DIAGNOSIS — Z992 Dependence on renal dialysis: Secondary | ICD-10-CM | POA: Insufficient documentation

## 2017-09-14 DIAGNOSIS — J449 Chronic obstructive pulmonary disease, unspecified: Secondary | ICD-10-CM | POA: Diagnosis not present

## 2017-09-14 DIAGNOSIS — E1122 Type 2 diabetes mellitus with diabetic chronic kidney disease: Secondary | ICD-10-CM | POA: Diagnosis not present

## 2017-09-14 DIAGNOSIS — I509 Heart failure, unspecified: Secondary | ICD-10-CM

## 2017-09-14 DIAGNOSIS — T50905A Adverse effect of unspecified drugs, medicaments and biological substances, initial encounter: Secondary | ICD-10-CM

## 2017-09-14 LAB — CBC WITH DIFFERENTIAL/PLATELET
Basophils Absolute: 0.1 10*3/uL (ref 0–0.1)
Basophils Relative: 1 %
Eosinophils Absolute: 0.4 10*3/uL (ref 0–0.7)
Eosinophils Relative: 6 %
HCT: 28.8 % — ABNORMAL LOW (ref 35.0–47.0)
Hemoglobin: 9.7 g/dL — ABNORMAL LOW (ref 12.0–16.0)
Lymphocytes Relative: 15 %
Lymphs Abs: 1 10*3/uL (ref 1.0–3.6)
MCH: 29.8 pg (ref 26.0–34.0)
MCHC: 33.7 g/dL (ref 32.0–36.0)
MCV: 88.6 fL (ref 80.0–100.0)
Monocytes Absolute: 0.5 10*3/uL (ref 0.2–0.9)
Monocytes Relative: 8 %
Neutro Abs: 4.6 10*3/uL (ref 1.4–6.5)
Neutrophils Relative %: 70 %
Platelets: 258 10*3/uL (ref 150–440)
RBC: 3.25 MIL/uL — ABNORMAL LOW (ref 3.80–5.20)
RDW: 14.4 % (ref 11.5–14.5)
WBC: 6.6 10*3/uL (ref 3.6–11.0)

## 2017-09-14 LAB — URINALYSIS, COMPLETE (UACMP) WITH MICROSCOPIC
Bilirubin Urine: NEGATIVE
Glucose, UA: NEGATIVE mg/dL
Hgb urine dipstick: NEGATIVE
Ketones, ur: NEGATIVE mg/dL
Nitrite: NEGATIVE
Protein, ur: >=300 mg/dL — AB
Specific Gravity, Urine: 1.014 (ref 1.005–1.030)
pH: 7 (ref 5.0–8.0)

## 2017-09-14 LAB — COMPREHENSIVE METABOLIC PANEL
ALT: 8 U/L — ABNORMAL LOW (ref 14–54)
AST: 17 U/L (ref 15–41)
Albumin: 3.9 g/dL (ref 3.5–5.0)
Alkaline Phosphatase: 69 U/L (ref 38–126)
Anion gap: 15 (ref 5–15)
BUN: 23 mg/dL — ABNORMAL HIGH (ref 6–20)
CO2: 26 mmol/L (ref 22–32)
Calcium: 9.2 mg/dL (ref 8.9–10.3)
Chloride: 98 mmol/L — ABNORMAL LOW (ref 101–111)
Creatinine, Ser: 6.23 mg/dL — ABNORMAL HIGH (ref 0.44–1.00)
GFR calc Af Amer: 7 mL/min — ABNORMAL LOW (ref >60)
GFR calc non Af Amer: 6 mL/min — ABNORMAL LOW (ref >60)
Glucose, Bld: 115 mg/dL — ABNORMAL HIGH (ref 65–99)
Potassium: 3.7 mmol/L (ref 3.5–5.1)
Sodium: 139 mmol/L (ref 135–145)
Total Bilirubin: 0.7 mg/dL (ref 0.3–1.2)
Total Protein: 7.3 g/dL (ref 6.5–8.1)

## 2017-09-14 LAB — TROPONIN I: Troponin I: <0.03 ng/mL (ref <0.03)

## 2017-09-14 LAB — BRAIN NATRIURETIC PEPTIDE: B Natriuretic Peptide: 578 pg/mL — ABNORMAL HIGH (ref 0.0–100.0)

## 2017-09-14 MED ORDER — FUROSEMIDE 10 MG/ML IJ SOLN
20.0000 mg | Freq: Once | INTRAMUSCULAR | Status: AC
  Administered 2017-09-14: 20 mg via INTRAVENOUS
  Filled 2017-09-14: qty 4

## 2017-09-14 MED ORDER — HYDROCODONE-ACETAMINOPHEN 5-325 MG PO TABS
1.0000 | ORAL_TABLET | Freq: Once | ORAL | Status: AC
  Administered 2017-09-14: 1 via ORAL

## 2017-09-14 MED ORDER — HYDROCODONE-ACETAMINOPHEN 5-325 MG PO TABS
ORAL_TABLET | ORAL | Status: AC
  Filled 2017-09-14: qty 1

## 2017-09-14 NOTE — ED Notes (Signed)
Verified with pt that she does take hydrocodone at home for pain. Pt verbalizes yes. I have been taking it for years. Im allergic to it in cough syrup.

## 2017-09-14 NOTE — ED Provider Notes (Signed)
[facility] Emergency Department Provider Note   ____________________________________________   First MD Initiated Contact with Patient 09/14/17 1947     (approximate)  I have reviewed the triage vital signs and the nursing notes.   HISTORY  Chief Complaint Allergic Reaction   HPI Phyllis [name] is a 71 y.o. female Says about 4:00 this morning she felt nervous and shaky and felt like everything was closing in on the inside. She is possibly a little bit better now. She started Levaquin 3 days ago for pneumonia she's had 2 pills so far. She is not itchy or any more short of breath than usual she has a history of COPD.  Past Medical History:  Diagnosis Date  . Allergy   . Asthma   . Chronic kidney disease   . COPD (chronic obstructive pulmonary disease) ([place])   . Diabetes mellitus without complication ([place])   . Dialysis patient ([place])    3 days every week.   . Dialysis patient ([place])   . Fistula    lower left arm  . GERD (gastroesophageal reflux disease)   . Gout   . Hypertension   . 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 Sleep apnea     Patient Active Problem List   Diagnosis Date Noted  . Chest discomfort 08/06/2015  . Hyperlipidemia 08/06/2015  . Complication from renal dialysis device 04/12/2015  . SOB (shortness of breath) 02/01/2015  . End stage renal disease ([place]) 02/01/2015  . 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 Type 2 diabetes mellitus with other specified complication ([place]) 81/82/9937  . Asthma 02/01/2015  . Chronic diastolic CHF (congestive heart failure) ([place]) 02/01/2015    Past Surgical History:  Procedure Laterality Date  . carpel tunnel    . GALLBLADDER SURGERY    . PERIPHERAL VASCULAR CATHETERIZATION N/A 04/12/2015   Procedure: A/V Shuntogram/Fistulagram;  Surgeon: Algernon Huxley, MD;  Location: [place] [facility];  Service: Cardiovascular;  Laterality: N/A;  . PERIPHERAL VASCULAR  CATHETERIZATION N/A 04/12/2015   Procedure: A/V Shunt Intervention;  Surgeon: Algernon Huxley, MD;  Location: [place] [facility];  Service: Cardiovascular;  Laterality: N/A;  . PERIPHERAL VASCULAR CATHETERIZATION N/A 06/09/2015   Procedure: Dialysis/Perma Catheter Removal;  Surgeon: Katha Cabal, MD;  Location: [place] [facility];  Service: Cardiovascular;  Laterality: N/A;    Prior to Admission medications   Medication Sig Start Date End Date Taking? Authorizing Provider  allopurinol (ZYLOPRIM) 100 MG tablet Take 100 mg by mouth 2 (two) times daily.  01/14/14   [provider]  amLODipine (NORVASC) 10 MG tablet Take 10 mg by mouth.  12/29/13   [provider]  calcium acetate, Phos Binder, (PHOSLYRA) 667 MG/5ML SOLN Take by mouth 3 (three) times daily with meals.    [provider]  carvedilol (COREG) 12.5 MG tablet Take 12.5 mg by mouth 2 (two) times daily with a meal.  01/09/14   [provider]  cetirizine (ZYRTEC) 10 MG tablet Take 10 mg by mouth as needed for allergies.    [provider]  cyclobenzaprine (FLEXERIL) 10 MG tablet Take 10 mg by mouth 3 (three) times daily as needed for muscle spasms.    [provider]  docusate sodium (COLACE) 100 MG capsule Take 100 mg by mouth 3 (three) times daily as needed for mild constipation.    [provider]  HYDROcodone-acetaminophen (NORCO) 5-325 MG tablet Take 1 tablet by  mouth every 6 (six) hours as needed for moderate pain or severe pain. 08/16/17   Eula Listen, MD  ipratropium-albuterol (DUONEB) 0.5-2.5 (3) MG/3ML SOLN Inhale into the lungs.    [provider]  levofloxacin (LEVAQUIN) 500 MG tablet Take 1 tablet (500 mg total) by mouth every other day. 09/13/17 09/18/17  Carrie Mew, MD  Lidocaine HCl 4 % GEL Apply 1 Application topically once daily as needed. 03/26/17 03/26/18  [provider]  mometasone (NASONEX) 50 MCG/ACT nasal spray Place 2 sprays  into the nose daily.    [provider]  omeprazole (PRILOSEC) 20 MG capsule Take 20 mg by mouth 2 (two) times daily before a meal.    [provider]  pravastatin (PRAVACHOL) 40 MG tablet Take 40 mg by mouth daily.  12/29/13   [provider]  senna (SENOKOT) 8.6 MG tablet Take 1 tablet by mouth daily.    [provider]  topiramate (TOPAMAX) 25 MG tablet Take 25 mg by mouth as needed.  01/14/14   [provider]  VESICARE 5 MG tablet Take 5 mg by mouth daily.  11/11/13   [provider]  Vitamin D, Ergocalciferol, (DRISDOL) 50000 UNITS CAPS capsule Take 50,000 Units by mouth every 7 (seven) days.  12/29/13   [provider]    Allergies Codeine; Enalapril maleate; Nitrofurantoin; Sulfamethoxazole-trimethoprim; Vicodin [hydrocodone-acetaminophen]; 2,4-d dimethylamine (amisol); Baclofen; Neosporin [neomycin-bacitracin zn-polymyx]; Quinine; Ultram [tramadol]; Zocor [simvastatin]; Bactrim [sulfamethoxazole-trimethoprim]; Macrodantin [nitrofurantoin macrocrystal]; Quinine derivatives; and Vasotec [enalapril]  Family History  Problem Relation Age of Onset  . Hypertension Mother   . Hyperlipidemia Mother   . Heart disease Father   . Heart attack Father 68  . Hypertension Father   . Hyperlipidemia Father   . Heart disease Brother        CABG   . Heart attack Brother   . Breast cancer Neg Hx     Social History Social History  Substance Use Topics  . Smoking status: Never Smoker  . Smokeless tobacco: Never Used  . Alcohol use No    Review of Systems  Constitutional: No fever/chills Eyes: No visual changes. ENT: No sore throat. Cardiovascular: Denies chest pain. Respiratory: Denies shortness of breath. Gastrointestinal: No abdominal pain.  No nausea, no vomiting.  No diarrhea.  No constipation. Genitourinary: Negative for dysuria. Musculoskeletal: Negative for back pain. Skin: Negative for rash. Neurological: Negative for  headaches, focal weakness  ____________________________________________   PHYSICAL EXAM:  VITAL SIGNS: ED Triage Vitals [09/14/17 1930]  Enc Vitals Group     BP (!) 167/53     Pulse Rate 99     Resp 16     Temp 98.4 F (36.9 C)     Temp Source Oral     SpO2 (!) 89 %     Weight      Height      Head Circumference      Peak Flow      Pain Score 9     Pain Loc      Pain Edu?      Excl. in [place]?     Constitutional: Alert and oriented. Well appearing and in no acute distress. Eyes: Conjunctivae are normal.  Head: Atraumatic. Nose: No congestion/rhinnorhea. Mouth/Throat: Mucous membranes are moist.  Oropharynx non-erythematous. Neck: No stridor.  Cardiovascular: Normal rate, regular rhythm. Grossly normal heart sounds.  Good peripheral circulation. Respiratory: Normal respiratory effort.  No retractions. Lungs CTAB. Gastrointestinal: Soft and nontender. No distention. No abdominal bruits. No  CVA tenderness. }Musculoskeletal: No lower extremity tenderness nor edema.  No joint effusions. Neurologic:  Normal speech and language. No gross focal neurologic deficits are appreciated. Skin:  Skin is warm, dry and intact. No rash noted. Psychiatric: Mood and affect are normal. Speech and behavior are normal.  ____________________________________________   LABS (all labs ordered are listed, but only abnormal results are displayed)  Labs Reviewed  COMPREHENSIVE METABOLIC PANEL - Abnormal; Notable for the following:       Result Value   Chloride 98 (*)    Glucose, Bld 115 (*)    BUN 23 (*)    Creatinine, Ser 6.23 (*)    ALT 8 (*)    GFR calc non Af Amer 6 (*)    GFR calc Af Amer 7 (*)    All other components within normal limits  BRAIN NATRIURETIC PEPTIDE - Abnormal; Notable for the following:    B Natriuretic Peptide 578.0 (*)    All other components within normal limits  CBC WITH DIFFERENTIAL/PLATELET - Abnormal; Notable for the following:    RBC 3.25 (*)    Hemoglobin 9.7  (*)    HCT 28.8 (*)    All other components within normal limits  URINALYSIS, COMPLETE (UACMP) WITH MICROSCOPIC - Abnormal; Notable for the following:    Color, Urine YELLOW (*)    APPearance HAZY (*)    Protein, ur >=300 (*)    Leukocytes, UA TRACE (*)    Bacteria, UA RARE (*)    Squamous Epithelial / LPF 0-5 (*)    All other components within normal limits  TROPONIN I   ____________________________________________  EKG EKG didn't done during the last visit 3 days ago showed no acute disease.  ____________________________________________  RADIOLOGY  chest x-ray looks mostly like congestive failure which would be consistent with her lab work especially the BNP and a normal white count ____________________________________________   PROCEDURES  Procedure(s) performed:   Procedures  Critical Care performed:   ____________________________________________   INITIAL IMPRESSION / ASSESSMENT AND PLAN / ED COURSE  As part of my medical decision making, I reviewed the following data within the [place]   reviewed care everywhere records from [facility] in [place] and the records from last visit here.  patient feels better after Lasix she is getting up and down not having that closing and sensation any more I will let her go have her follow-up with acute care tomorrow patient's BNP is lower than it was 3 days ago he feels better. ____________________________________________   FINAL CLINICAL IMPRESSION(S) / ED DIAGNOSES  Final diagnoses:  Adverse effect of drug, initial encounter  Congestive heart failure, unspecified HF chronicity, unspecified heart failure type ([place])      NEW MEDICATIONS STARTED DURING THIS VISIT:  New Prescriptions   No medications on file     Note:  This document was prepared using Dragon voice recognition software and may include unintentional dictation errors.    Nena Polio, MD 09/14/17 2312

## 2017-09-14 NOTE — ED Notes (Signed)
Pt up to the bedside commode for the 2nd time to urinate

## 2017-09-14 NOTE — Discharge Instructions (Signed)
please do not take the Levaquin any longer. Please return if you have any more symptoms of the closing in feeling or anything else unusual. Please follow-up with your doctor or acute-care tomorrow. Please have them review the tests we did here. It looks like you have a little bit of congestive heart failure yourheart is not pumping as well as it should.

## 2017-09-14 NOTE — ED Triage Notes (Signed)
Patient seen her for SOB and pneumonia found and patient started on Levofloxacin 500mg  every other day.  Pt states "it feels like things are closing in on me", she denies any skin irregularities, or SOB.  Pt had hx of COPD and Asthma.  Pt has chronic neck and back pain.

## 2017-09-14 NOTE — ED Notes (Signed)
Family out of room asking for something for pain in the neck. Pt has chronic neck pain.

## 2017-09-14 NOTE — ED Notes (Signed)
Family at bedside. 

## 2017-09-14 NOTE — ED Notes (Signed)
Pt to the er for possible allergic reaction. Pt currently taking levoquin every other day for pneumonia. Pt seen in the Er by [facility]. Pt RX 4 levoquin tabs. Pt has taken 2. Today when pt was sitting in her recliner, she felt like things were closing in and jittery on the inside. Pt says it started at 4 or 5 yesterday. Pt sats are 95% on room air at this time.

## 2017-09-14 NOTE — ED Notes (Signed)
ED Provider at bedside. 

## 2017-09-15 ENCOUNTER — Emergency Department
Admission: EM | Admit: 2017-09-15 | Discharge: 2017-09-15 | Disposition: A | Discharge status: Home / Self Care | Payer: Medicare Other | Attending: Emergency Medicine | Admitting: Emergency Medicine

## 2017-09-15 ENCOUNTER — Encounter: Payer: Self-pay | Admitting: Emergency Medicine

## 2017-09-15 DIAGNOSIS — Z992 Dependence on renal dialysis: Secondary | ICD-10-CM | POA: Diagnosis not present

## 2017-09-15 DIAGNOSIS — J45909 Unspecified asthma, uncomplicated: Secondary | ICD-10-CM | POA: Insufficient documentation

## 2017-09-15 DIAGNOSIS — Z7902 Long term (current) use of antithrombotics/antiplatelets: Secondary | ICD-10-CM | POA: Insufficient documentation

## 2017-09-15 DIAGNOSIS — I132 Hypertensive heart and chronic kidney disease with heart failure and with stage 5 chronic kidney disease, or end stage renal disease: Secondary | ICD-10-CM | POA: Diagnosis not present

## 2017-09-15 DIAGNOSIS — F419 Anxiety disorder, unspecified: Secondary | ICD-10-CM | POA: Diagnosis not present

## 2017-09-15 DIAGNOSIS — J449 Chronic obstructive pulmonary disease, unspecified: Secondary | ICD-10-CM | POA: Diagnosis not present

## 2017-09-15 DIAGNOSIS — I5032 Chronic diastolic (congestive) heart failure: Secondary | ICD-10-CM | POA: Insufficient documentation

## 2017-09-15 DIAGNOSIS — Z79899 Other long term (current) drug therapy: Secondary | ICD-10-CM | POA: Insufficient documentation

## 2017-09-15 DIAGNOSIS — E1122 Type 2 diabetes mellitus with diabetic chronic kidney disease: Secondary | ICD-10-CM | POA: Insufficient documentation

## 2017-09-15 DIAGNOSIS — N186 End stage renal disease: Secondary | ICD-10-CM | POA: Insufficient documentation

## 2017-09-15 LAB — CBC WITH DIFFERENTIAL/PLATELET
Basophils Absolute: 0 10*3/uL (ref 0–0.1)
Basophils Relative: 1 %
Eosinophils Absolute: 0.2 10*3/uL (ref 0–0.7)
Eosinophils Relative: 4 %
HCT: 27.8 % — ABNORMAL LOW (ref 35.0–47.0)
Hemoglobin: 9.3 g/dL — ABNORMAL LOW (ref 12.0–16.0)
Lymphocytes Relative: 12 %
Lymphs Abs: 0.8 10*3/uL — ABNORMAL LOW (ref 1.0–3.6)
MCH: 29.9 pg (ref 26.0–34.0)
MCHC: 33.6 g/dL (ref 32.0–36.0)
MCV: 89.2 fL (ref 80.0–100.0)
Monocytes Absolute: 0.5 10*3/uL (ref 0.2–0.9)
Monocytes Relative: 7 %
Neutro Abs: 5 10*3/uL (ref 1.4–6.5)
Neutrophils Relative %: 76 %
Platelets: 231 10*3/uL (ref 150–440)
RBC: 3.12 MIL/uL — ABNORMAL LOW (ref 3.80–5.20)
RDW: 14.4 % (ref 11.5–14.5)
WBC: 6.6 10*3/uL (ref 3.6–11.0)

## 2017-09-15 LAB — COMPREHENSIVE METABOLIC PANEL
ALT: 9 U/L — ABNORMAL LOW (ref 14–54)
AST: 19 U/L (ref 15–41)
Albumin: 3.8 g/dL (ref 3.5–5.0)
Alkaline Phosphatase: 60 U/L (ref 38–126)
Anion gap: 12 (ref 5–15)
BUN: 11 mg/dL (ref 6–20)
CO2: 29 mmol/L (ref 22–32)
Calcium: 8.6 mg/dL — ABNORMAL LOW (ref 8.9–10.3)
Chloride: 101 mmol/L (ref 101–111)
Creatinine, Ser: 3.67 mg/dL — ABNORMAL HIGH (ref 0.44–1.00)
GFR calc Af Amer: 13 mL/min — ABNORMAL LOW (ref >60)
GFR calc non Af Amer: 11 mL/min — ABNORMAL LOW (ref >60)
Glucose, Bld: 136 mg/dL — ABNORMAL HIGH (ref 65–99)
Potassium: 3.2 mmol/L — ABNORMAL LOW (ref 3.5–5.1)
Sodium: 142 mmol/L (ref 135–145)
Total Bilirubin: 0.6 mg/dL (ref 0.3–1.2)
Total Protein: 7 g/dL (ref 6.5–8.1)

## 2017-09-15 MED ORDER — ACETAMINOPHEN 325 MG PO TABS
650.0000 mg | ORAL_TABLET | Freq: Once | ORAL | Status: AC
  Administered 2017-09-15: 650 mg via ORAL
  Filled 2017-09-15: qty 2

## 2017-09-15 NOTE — ED Triage Notes (Signed)
States has been feeling anxious x 1 1/2 hours. States was at dialysis and stopped her treatment about 30 min early. States had similar symptoms yesterday and thought it was a reaction to an antibiotic.

## 2017-09-15 NOTE — ED Provider Notes (Addendum)
[facility] Emergency Department Provider Note  ____________________________________________   I have reviewed the triage vital signs and the nursing notes.   HISTORY  Chief Complaint Anxiety    HPI Phyllis [name] is a 71 y.o. female who was recently on Levaquin has not had a dose in 2 days however it made her feel very anxious.  She states she still feels anxious.  She has no chest pain or shortness of breath no nausea no vomiting, she is not having any hallucinations, she does not feel otherwise altered she did drive a car here with no difficulty.  She states "on the inside I feel anxious".  She has no SI no HI, she states that it is unusual for her to feel this anxious.  She is anxious about feeling anxious.  She has had no recent change in medications, aside from Levaquin.  Apparently, after the Levaquin she received dialysis and all of her respiratory symptoms are gone and she has no ongoing symptoms of that variety.  No chest pain or shortness of breath, no focal numbness no weakness no chest pain no headache no stiff neck she has no focal complaints of neurologic deficit or difficulty speaking, she just feels "anxious inside". She states she stopped her dialysis today a few minutes early because she was feeling very anxious.  Yesterday for feeling anxious at that time as well.  No history of benzodiazepine use or abuse, no history of alcohol use or abuse     Past Medical History:  Diagnosis Date  . Allergy   . Asthma   . Chronic kidney disease   . COPD (chronic obstructive pulmonary disease) ([place])   . Diabetes mellitus without complication ([place])   . Dialysis patient ([place])    3 days every week.   . Dialysis patient ([place])   . Fistula    lower left arm  . GERD (gastroesophageal reflux disease)   . Gout   . Hypertension   . 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 Sleep apnea     Patient Active Problem List   Diagnosis Date  Noted  . Chest discomfort 08/06/2015  . Hyperlipidemia 08/06/2015  . Complication from renal dialysis device 04/12/2015  . SOB (shortness of breath) 02/01/2015  . End stage renal disease ([place]) 02/01/2015  . 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 Type 2 diabetes mellitus with other specified complication ([place]) 16/60/6301  . Asthma 02/01/2015  . Chronic diastolic CHF (congestive heart failure) ([place]) 02/01/2015    Past Surgical History:  Procedure Laterality Date  . carpel tunnel    . GALLBLADDER SURGERY    . PERIPHERAL VASCULAR CATHETERIZATION N/A 04/12/2015   Procedure: A/V Shuntogram/Fistulagram;  Surgeon: Algernon Huxley, MD;  Location: [place] [facility];  Service: Cardiovascular;  Laterality: N/A;  . PERIPHERAL VASCULAR CATHETERIZATION N/A 04/12/2015   Procedure: A/V Shunt Intervention;  Surgeon: Algernon Huxley, MD;  Location: [place] [facility];  Service: Cardiovascular;  Laterality: N/A;  . PERIPHERAL VASCULAR CATHETERIZATION N/A 06/09/2015   Procedure: Dialysis/Perma Catheter Removal;  Surgeon: Katha Cabal, MD;  Location: [place] [facility];  Service: Cardiovascular;  Laterality: N/A;    Prior to Admission medications   Medication Sig Start Date End Date Taking? Authorizing Provider  allopurinol (ZYLOPRIM) 100 MG tablet Take 100 mg by mouth 2 (two) times daily.  01/14/14   [provider]  amLODipine (NORVASC) 10 MG tablet Take 10 mg by mouth.  12/29/13   [provider]  calcium acetate, Phos Binder, (PHOSLYRA) 667 MG/5ML SOLN Take by mouth 3 (three) times daily with meals.    [provider]  carvedilol (COREG) 12.5 MG tablet Take 12.5 mg by mouth 2 (two) times daily with a meal.  01/09/14   [provider]  cetirizine (ZYRTEC) 10 MG tablet Take 10 mg by mouth as needed for allergies.    [provider]  cyclobenzaprine (FLEXERIL) 10 MG tablet Take 10 mg by mouth 3 (three) times daily as needed for muscle spasms.    [provider]  docusate sodium (COLACE) 100 MG capsule Take 100 mg by mouth 3 (three) times daily as needed for mild constipation.    [provider]  HYDROcodone-acetaminophen (NORCO) 5-325 MG tablet Take 1 tablet by mouth every 6 (six) hours as needed for moderate pain or severe pain. 08/16/17   Eula Listen, MD  ipratropium-albuterol (DUONEB) 0.5-2.5 (3) MG/3ML SOLN Inhale into the lungs.    [provider]  levofloxacin (LEVAQUIN) 500 MG tablet Take 1 tablet (500 mg total) by mouth every other day. 09/13/17 09/18/17  Carrie Mew, MD  Lidocaine HCl 4 % GEL Apply 1 Application topically once daily as needed. 03/26/17 03/26/18  [provider]  mometasone (NASONEX) 50 MCG/ACT nasal spray Place 2 sprays into the nose daily.    [provider]  omeprazole (PRILOSEC) 20 MG capsule Take 20 mg by mouth 2 (two) times daily before a meal.    [provider]  pravastatin (PRAVACHOL) 40 MG tablet Take 40 mg by mouth daily.  12/29/13   [provider]  senna (SENOKOT) 8.6 MG tablet Take 1 tablet by mouth daily.    [provider]  topiramate (TOPAMAX) 25 MG tablet Take 25 mg by mouth as needed.  01/14/14   [provider]  VESICARE 5 MG tablet Take 5 mg by mouth daily.  11/11/13   [provider]  Vitamin D, Ergocalciferol, (DRISDOL) 50000 UNITS CAPS capsule Take 50,000 Units by mouth every 7 (seven) days.  12/29/13   [provider]    Allergies Codeine; Enalapril maleate; Nitrofurantoin; Sulfamethoxazole-trimethoprim; Vicodin [hydrocodone-acetaminophen]; 2,4-d dimethylamine (amisol); Baclofen; Neosporin [neomycin-bacitracin zn-polymyx]; Quinine; Ultram [tramadol]; Zocor [simvastatin]; Bactrim [sulfamethoxazole-trimethoprim]; Macrodantin [nitrofurantoin macrocrystal]; Quinine derivatives; and Vasotec [enalapril]  Family History  Problem Relation Age of Onset  . Hypertension Mother   . Hyperlipidemia Mother    . Heart disease Father   . Heart attack Father 38  . Hypertension Father   . Hyperlipidemia Father   . Heart disease Brother        CABG   . Heart attack Brother   . Breast cancer Neg Hx     Social History Social History  Substance Use Topics  . Smoking status: Never Smoker  . Smokeless tobacco: Never Used  . Alcohol use No    Review of Systems Constitutional: No fever/chills Eyes: No visual changes. ENT: No sore throat. No stiff neck no neck pain Cardiovascular: Denies chest pain. Respiratory: Denies shortness of breath. Gastrointestinal:   no vomiting.  No diarrhea.  No constipation. Genitourinary: Negative for dysuria. Musculoskeletal: Negative lower extremity swelling Skin: Negative for rash. Neurological: Negative for severe headaches, focal weakness or numbness.   ____________________________________________   PHYSICAL EXAM:  VITAL SIGNS: ED Triage Vitals  Enc Vitals Group     BP 09/15/17 1454 (!) 137/36     Pulse Rate 09/15/17 1454 72     Resp 09/15/17 1454  18     Temp 09/15/17 1454 98.8 F (37.1 C)     Temp Source 09/15/17 1454 Oral     SpO2 09/15/17 1454 96 %     Weight 09/15/17 1456 139 lb (63 kg)     Height 09/15/17 1456 4\' 10"  (1.473 m)     Head Circumference --      Peak Flow --      Pain Score --      Pain Loc --      Pain Edu? --      Excl. in [place]? --     Constitutional: Alert and oriented. Well appearing and in no acute distress. Eyes: Conjunctivae are normal Head: Atraumatic HEENT: No congestion/rhinnorhea. Mucous membranes are moist.  Oropharynx non-erythematous Neck:   Nontender with no meningismus, no masses, no stridor Cardiovascular: Normal rate, regular rhythm. Grossly normal heart sounds.  Good peripheral circulation. Respiratory: Normal respiratory effort.  No retractions. Lungs CTAB. Abdominal: Soft and nontender. No distention. No guarding no rebound Back:  There is no focal tenderness or step off.  there is no midline  tenderness there are no lesions noted. there is no CVA tenderness Musculoskeletal: No lower extremity tenderness, no upper extremity tenderness. No joint effusions, no DVT signs strong distal pulses no edema Neurologic:  Normal speech and language. No gross focal neurologic deficits are appreciated.  Skin:  Skin is warm, dry and intact. No rash noted. Psychiatric: Mood and affect are anxious. Speech and behavior are normal.  ____________________________________________   LABS (all labs ordered are listed, but only abnormal results are displayed)  Labs Reviewed  CBC WITH DIFFERENTIAL/PLATELET - Abnormal; Notable for the following:       Result Value   RBC 3.12 (*)    Hemoglobin 9.3 (*)    HCT 27.8 (*)    Lymphs Abs 0.8 (*)    All other components within normal limits  COMPREHENSIVE METABOLIC PANEL - Abnormal; Notable for the following:    Potassium 3.2 (*)    Glucose, Bld 136 (*)    Creatinine, Ser 3.67 (*)    Calcium 8.6 (*)    ALT 9 (*)    GFR calc non Af Amer 11 (*)    GFR calc Af Amer 13 (*)    All other components within normal limits    Pertinent labs  results that were available during my care of the patient were reviewed by me and considered in my medical decision making (see chart for details). ____________________________________________  EKG  I personally interpreted any EKGs ordered by me or triage Normal sinus rhythm, LVH noted no acute ST elevation or depression no specific ST changes rate 86 ____________________________________________  RADIOLOGY  Pertinent labs & imaging results that were available during my care of the patient were reviewed by me and considered in my medical decision making (see chart for details). If possible, patient and/or family made aware of any abnormal findings. ____________________________________________    PROCEDURES  Procedure(s) performed: None  Procedures  Critical Care performed:  None  ____________________________________________   INITIAL IMPRESSION / ASSESSMENT AND PLAN / ED COURSE  Pertinent labs & imaging results that were available during my care of the patient were reviewed by me and considered in my medical decision making (see chart for details).  Here feeling anxious, she was seen here yesterday as well.  Very reassuring exam and workup thus far.  Nothing to suggest withdrawal or CVA, nothing to suggest ACS PE or dissection nothing to suggest ongoing  significant toxidrome.  Is not unusual for people her age to have reactions to Levaquin but she has not been out for 2 days there is no evidence of pneumonia on chest x-ray yesterday she has no cough or shortness of breath today.  She is no longer taking the medication of advised her not to return to it it should clear, extensive return precautions and follow-up given and understood patient does not meet criteria for admitted shunt, she has not altered in any way, she just feels somewhat anxious.  She has no evidence of danger to self or others.   ----------------------------------------- 7:17 PM on 09/15/2017 -----------------------------------------  Patient in no acute distress using the telephone, she is reassured by her visit here, no acute intervention is indicated at this moment I do not think, we have encouraged her to practice deep breathing and relax, and we will have her follow closely with primary care.  She will no longer take Levaquin and there is no indication that is acutely needed at this time     ____________________________________________   FINAL CLINICAL IMPRESSION(S) / ED DIAGNOSES  Final diagnoses:  None      This chart was dictated using voice recognition software.  Despite best efforts to proofread,  errors can occur which can change meaning.      Schuyler Amor, MD 09/15/17 1843    Schuyler Amor, MD 09/15/17 [id number]

## 2017-09-15 NOTE — Discharge Instructions (Signed)
Return to the emergency room for any new or worrisome symptoms, if you feel more anxious have any thoughts of hurting herself or anyone else or any other concerns please return here.  Do not take any more Levaquin, which should be out of your system very soon,

## 2017-09-18 ENCOUNTER — Encounter: Payer: Self-pay | Admitting: *Deleted

## 2017-09-18 ENCOUNTER — Observation Stay
Admission: EM | Admit: 2017-09-18 | Discharge: 2017-09-19 | Disposition: A | Discharge status: Home / Self Care | Payer: Medicare Other | Attending: Internal Medicine | Admitting: Internal Medicine

## 2017-09-18 ENCOUNTER — Telehealth: Payer: Self-pay | Admitting: Internal Medicine

## 2017-09-18 ENCOUNTER — Emergency Department: Payer: Medicare Other

## 2017-09-18 DIAGNOSIS — K219 Gastro-esophageal reflux disease without esophagitis: Secondary | ICD-10-CM | POA: Insufficient documentation

## 2017-09-18 DIAGNOSIS — J449 Chronic obstructive pulmonary disease, unspecified: Secondary | ICD-10-CM | POA: Insufficient documentation

## 2017-09-18 DIAGNOSIS — E785 Hyperlipidemia, unspecified: Secondary | ICD-10-CM | POA: Diagnosis not present

## 2017-09-18 DIAGNOSIS — Z882 Allergy status to sulfonamides status: Secondary | ICD-10-CM | POA: Insufficient documentation

## 2017-09-18 DIAGNOSIS — M1990 Unspecified osteoarthritis, unspecified site: Secondary | ICD-10-CM | POA: Insufficient documentation

## 2017-09-18 DIAGNOSIS — N186 End stage renal disease: Secondary | ICD-10-CM | POA: Diagnosis not present

## 2017-09-18 DIAGNOSIS — M109 Gout, unspecified: Secondary | ICD-10-CM | POA: Insufficient documentation

## 2017-09-18 DIAGNOSIS — Z888 Allergy status to other drugs, medicaments and biological substances status: Secondary | ICD-10-CM | POA: Insufficient documentation

## 2017-09-18 DIAGNOSIS — R079 Chest pain, unspecified: Secondary | ICD-10-CM | POA: Diagnosis present

## 2017-09-18 DIAGNOSIS — I5032 Chronic diastolic (congestive) heart failure: Secondary | ICD-10-CM | POA: Insufficient documentation

## 2017-09-18 DIAGNOSIS — Z992 Dependence on renal dialysis: Secondary | ICD-10-CM | POA: Insufficient documentation

## 2017-09-18 DIAGNOSIS — I2510 Atherosclerotic heart disease of native coronary artery without angina pectoris: Secondary | ICD-10-CM | POA: Insufficient documentation

## 2017-09-18 DIAGNOSIS — Z885 Allergy status to narcotic agent status: Secondary | ICD-10-CM | POA: Diagnosis not present

## 2017-09-18 DIAGNOSIS — E1122 Type 2 diabetes mellitus with diabetic chronic kidney disease: Secondary | ICD-10-CM | POA: Diagnosis not present

## 2017-09-18 DIAGNOSIS — Z79899 Other long term (current) drug therapy: Secondary | ICD-10-CM | POA: Diagnosis not present

## 2017-09-18 DIAGNOSIS — Z66 Do not resuscitate: Secondary | ICD-10-CM | POA: Diagnosis not present

## 2017-09-18 DIAGNOSIS — R0789 Other chest pain: Secondary | ICD-10-CM | POA: Diagnosis not present

## 2017-09-18 DIAGNOSIS — G4730 Sleep apnea, unspecified: Secondary | ICD-10-CM | POA: Insufficient documentation

## 2017-09-18 DIAGNOSIS — I132 Hypertensive heart and chronic kidney disease with heart failure and with stage 5 chronic kidney disease, or end stage renal disease: Secondary | ICD-10-CM | POA: Diagnosis not present

## 2017-09-18 LAB — BASIC METABOLIC PANEL
Anion gap: 14 (ref 5–15)
BUN: 28 mg/dL — ABNORMAL HIGH (ref 6–20)
CO2: 27 mmol/L (ref 22–32)
Calcium: 8.6 mg/dL — ABNORMAL LOW (ref 8.9–10.3)
Chloride: 97 mmol/L — ABNORMAL LOW (ref 101–111)
Creatinine, Ser: 7.79 mg/dL — ABNORMAL HIGH (ref 0.44–1.00)
GFR calc Af Amer: 5 mL/min — ABNORMAL LOW (ref >60)
GFR calc non Af Amer: 5 mL/min — ABNORMAL LOW (ref >60)
Glucose, Bld: 111 mg/dL — ABNORMAL HIGH (ref 65–99)
Potassium: 3.7 mmol/L (ref 3.5–5.1)
Sodium: 138 mmol/L (ref 135–145)

## 2017-09-18 LAB — CBC
HCT: 28.7 % — ABNORMAL LOW (ref 35.0–47.0)
Hemoglobin: 9.5 g/dL — ABNORMAL LOW (ref 12.0–16.0)
MCH: 29.4 pg (ref 26.0–34.0)
MCHC: 33.2 g/dL (ref 32.0–36.0)
MCV: 88.5 fL (ref 80.0–100.0)
Platelets: 246 10*3/uL (ref 150–440)
RBC: 3.24 MIL/uL — ABNORMAL LOW (ref 3.80–5.20)
RDW: 14.3 % (ref 11.5–14.5)
WBC: 6.7 10*3/uL (ref 3.6–11.0)

## 2017-09-18 LAB — TROPONIN I
Troponin I: 0.03 ng/mL (ref <0.03)
Troponin I: 0.03 ng/mL (ref <0.03)
Troponin I: 0.03 ng/mL (ref <0.03)

## 2017-09-18 MED ORDER — CARVEDILOL 12.5 MG PO TABS
12.5000 mg | ORAL_TABLET | Freq: Two times a day (BID) | ORAL | Status: DC
  Administered 2017-09-18 – 2017-09-19 (×2): 12.5 mg via ORAL
  Filled 2017-09-18 (×2): qty 1

## 2017-09-18 MED ORDER — IPRATROPIUM-ALBUTEROL 0.5-2.5 (3) MG/3ML IN SOLN
3.0000 mL | Freq: Four times a day (QID) | RESPIRATORY_TRACT | Status: DC
  Administered 2017-09-18 – 2017-09-19 (×3): 3 mL via RESPIRATORY_TRACT
  Filled 2017-09-18 (×3): qty 3

## 2017-09-18 MED ORDER — ONDANSETRON HCL 4 MG PO TABS: 4.0000 mg | ORAL_TABLET | Freq: Four times a day (QID) | ORAL | Status: DC | PRN

## 2017-09-18 MED ORDER — HEPARIN SODIUM (PORCINE) 5000 UNIT/ML IJ SOLN
5000.0000 [IU] | Freq: Three times a day (TID) | INTRAMUSCULAR | Status: DC
  Administered 2017-09-18 – 2017-09-19 (×3): 5000 [IU] via SUBCUTANEOUS
  Filled 2017-09-18 (×3): qty 1

## 2017-09-18 MED ORDER — HYDROCODONE-ACETAMINOPHEN 5-325 MG PO TABS: 1.0000 | ORAL_TABLET | Freq: Four times a day (QID) | ORAL | Status: DC | PRN

## 2017-09-18 MED ORDER — SENNA 8.6 MG PO TABS
1.0000 | ORAL_TABLET | Freq: Every day | ORAL | Status: DC
  Administered 2017-09-18 – 2017-09-19 (×2): 8.6 mg via ORAL
  Filled 2017-09-18 (×2): qty 1

## 2017-09-18 MED ORDER — ACETAMINOPHEN 650 MG RE SUPP: 650.0000 mg | Freq: Four times a day (QID) | RECTAL | Status: DC | PRN

## 2017-09-18 MED ORDER — PANTOPRAZOLE SODIUM 40 MG PO TBEC
40.0000 mg | DELAYED_RELEASE_TABLET | Freq: Two times a day (BID) | ORAL | Status: DC
Start: 2017-09-18 — End: 2017-09-19
  Administered 2017-09-18 – 2017-09-19 (×2): 40 mg via ORAL
  Filled 2017-09-18 (×2): qty 1

## 2017-09-18 MED ORDER — SODIUM CHLORIDE 0.9% FLUSH
3.0000 mL | Freq: Two times a day (BID) | INTRAVENOUS | Status: DC
  Administered 2017-09-18 – 2017-09-19 (×2): 3 mL via INTRAVENOUS

## 2017-09-18 MED ORDER — FENTANYL CITRATE (PF) 100 MCG/2ML IJ SOLN
25.0000 ug | Freq: Once | INTRAMUSCULAR | Status: AC
  Administered 2017-09-18: 25 ug via INTRAVENOUS
  Filled 2017-09-18: qty 2

## 2017-09-18 MED ORDER — FLUTICASONE PROPIONATE 50 MCG/ACT NA SUSP
2.0000 | Freq: Every day | NASAL | Status: DC
  Administered 2017-09-19: 2 via NASAL
  Filled 2017-09-18: qty 16

## 2017-09-18 MED ORDER — DARIFENACIN HYDROBROMIDE ER 7.5 MG PO TB24
7.5000 mg | ORAL_TABLET | Freq: Every day | ORAL | Status: DC
  Administered 2017-09-19: 7.5 mg via ORAL
  Filled 2017-09-18: qty 1

## 2017-09-18 MED ORDER — ACETAMINOPHEN 325 MG PO TABS: 650.0000 mg | ORAL_TABLET | Freq: Four times a day (QID) | ORAL | Status: DC | PRN

## 2017-09-18 MED ORDER — DOCUSATE SODIUM 100 MG PO CAPS: 100.0000 mg | ORAL_CAPSULE | Freq: Three times a day (TID) | ORAL | Status: DC | PRN

## 2017-09-18 MED ORDER — ONDANSETRON HCL 4 MG/2ML IJ SOLN
4.0000 mg | Freq: Four times a day (QID) | INTRAMUSCULAR | Status: DC | PRN
  Administered 2017-09-19: 4 mg via INTRAVENOUS
  Filled 2017-09-18: qty 2

## 2017-09-18 MED ORDER — ASPIRIN EC 81 MG PO TBEC
81.0000 mg | DELAYED_RELEASE_TABLET | Freq: Every day | ORAL | Status: DC
  Administered 2017-09-19: 81 mg via ORAL
  Filled 2017-09-18: qty 1

## 2017-09-18 MED ORDER — CYCLOBENZAPRINE HCL 10 MG PO TABS: 10.0000 mg | ORAL_TABLET | Freq: Three times a day (TID) | ORAL | Status: DC | PRN

## 2017-09-18 MED ORDER — LORATADINE 10 MG PO TABS
10.0000 mg | ORAL_TABLET | Freq: Every day | ORAL | Status: DC
  Administered 2017-09-19: 10 mg via ORAL
  Filled 2017-09-18: qty 1

## 2017-09-18 MED ORDER — ASPIRIN 81 MG PO CHEW
324.0000 mg | CHEWABLE_TABLET | Freq: Once | ORAL | Status: AC
  Administered 2017-09-18: 324 mg via ORAL
  Filled 2017-09-18: qty 4

## 2017-09-18 MED ORDER — METHYLPREDNISOLONE SODIUM SUCC 40 MG IJ SOLR
40.0000 mg | Freq: Every day | INTRAMUSCULAR | Status: DC
  Administered 2017-09-18 – 2017-09-19 (×2): 40 mg via INTRAVENOUS
  Filled 2017-09-18 (×2): qty 1

## 2017-09-18 MED ORDER — AMLODIPINE BESYLATE 10 MG PO TABS
10.0000 mg | ORAL_TABLET | Freq: Every day | ORAL | Status: DC
Start: 2017-09-19 — End: 2017-09-19
  Administered 2017-09-19: 10 mg via ORAL
  Filled 2017-09-18: qty 1

## 2017-09-18 MED ORDER — BUDESONIDE 0.5 MG/2ML IN SUSP
0.5000 mg | Freq: Two times a day (BID) | RESPIRATORY_TRACT | Status: DC
  Administered 2017-09-18 – 2017-09-19 (×2): 0.5 mg via RESPIRATORY_TRACT
  Filled 2017-09-18 (×3): qty 2

## 2017-09-18 MED ORDER — CALCIUM ACETATE (PHOS BINDER) 667 MG/5ML PO SOLN
667.0000 mg | Freq: Three times a day (TID) | ORAL | Status: DC
  Administered 2017-09-18 – 2017-09-19 (×2): 667 mg via ORAL
  Filled 2017-09-18 (×4): qty 5

## 2017-09-18 MED ORDER — PRAVASTATIN SODIUM 40 MG PO TABS
40.0000 mg | ORAL_TABLET | Freq: Every day | ORAL | Status: DC
  Administered 2017-09-18: 40 mg via ORAL
  Filled 2017-09-18: qty 1

## 2017-09-18 NOTE — ED Provider Notes (Signed)
[facility] Emergency Department Provider Note  ____________________________________________   First MD Initiated Contact with Patient 09/18/17 1059     (approximate)  I have reviewed the triage vital signs and the nursing notes.   HISTORY  Chief Complaint Chest Pain   HPI Phyllis [name] is a 71 y.o. female history of CHF as well as end-stage renal disease on dialysis who is complaining of 24 hours of chest pain.  She says the chest pain started when she was at rest.  She says the pain is central and nonradiating it feels like a squeezing pain.  She rates it as a 5 out of 10 at this time.  She says that the pain has been intermittent since yesterday morning and is not worsened by activity or movement.  She has no associated symptoms.  Patient is denying any associated shortness of breath, nausea, vomiting, diaphoresis or worsening with exertion.  Patient does not have a history of ischemic heart disease.  Denies any radiation of her pain.  Patient came to the emergency department instead of going to dialysis because of concern over her chest pain.  She is a patient of Dr. Zollie Scale.  Last dialyzed this past Tuesday.    Past Medical History:  Diagnosis Date  . Allergy   . Asthma   . Chronic kidney disease   . COPD (chronic obstructive pulmonary disease) ([place])   . Diabetes mellitus without complication ([place])   . Dialysis patient ([place])    3 days every week.   . Dialysis patient ([place])   . Fistula    lower left arm  . GERD (gastroesophageal reflux disease)   . Gout   . Hypertension   . 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 Sleep apnea     Patient Active Problem List   Diagnosis Date Noted  . Chest discomfort 08/06/2015  . Hyperlipidemia 08/06/2015  . Complication from renal dialysis device 04/12/2015  . SOB (shortness of breath) 02/01/2015  . End stage renal disease ([place]) 02/01/2015  . Dependence on hemodialysis ([place])  02/01/2015  . Type 2 diabetes mellitus with other specified complication ([place]) 03/50/0938  . Asthma 02/01/2015  . Chronic diastolic CHF (congestive heart failure) ([place]) 02/01/2015    Past Surgical History:  Procedure Laterality Date  . carpel tunnel    . GALLBLADDER SURGERY    . PERIPHERAL VASCULAR CATHETERIZATION N/A 04/12/2015   Procedure: A/V Shuntogram/Fistulagram;  Surgeon: Algernon Huxley, MD;  Location: [place] [facility];  Service: Cardiovascular;  Laterality: N/A;  . PERIPHERAL VASCULAR CATHETERIZATION N/A 04/12/2015   Procedure: A/V Shunt Intervention;  Surgeon: Algernon Huxley, MD;  Location: [place] [facility];  Service: Cardiovascular;  Laterality: N/A;  . PERIPHERAL VASCULAR CATHETERIZATION N/A 06/09/2015   Procedure: Dialysis/Perma Catheter Removal;  Surgeon: Katha Cabal, MD;  Location: [place] [facility];  Service: Cardiovascular;  Laterality: N/A;    Prior to Admission medications   Medication Sig Start Date End Date Taking? Authorizing Provider  albuterol (PROVENTIL HFA;VENTOLIN HFA) 108 (90 Base) MCG/ACT inhaler Inhale 2 puffs into the lungs every 6 (six) hours as needed for wheezing or shortness of breath.   Yes [provider]  amLODipine (NORVASC) 10 MG tablet Take 10 mg by mouth.  12/29/13  Yes [provider]  calcium acetate, Phos Binder, (PHOSLYRA) 667 MG/5ML SOLN Take by mouth 3 (three) times daily with meals.   Yes [provider]  carvedilol (COREG)  12.5 MG tablet Take 12.5 mg by mouth 2 (two) times daily with a meal.  01/09/14  Yes [provider]  cetirizine (ZYRTEC) 10 MG tablet Take 10 mg by mouth as needed for allergies.   Yes [provider]  Fluticasone-Salmeterol (ADVAIR) 250-50 MCG/DOSE AEPB Inhale 1 puff into the lungs 2 (two) times daily.   Yes [provider]  HYDROcodone-acetaminophen (NORCO) 5-325 MG tablet Take 1 tablet by mouth every 6 (six) hours as needed for moderate pain or severe pain.  08/16/17  Yes Eula Listen, MD  omeprazole (PRILOSEC) 20 MG capsule Take 20 mg by mouth 2 (two) times daily before a meal.   Yes [provider]  ondansetron (ZOFRAN) 4 MG tablet Take 4 mg by mouth every 8 (eight) hours as needed for nausea or vomiting.   Yes [provider]  pravastatin (PRAVACHOL) 40 MG tablet Take 40 mg by mouth daily.  12/29/13  Yes [provider]  VESICARE 5 MG tablet Take 5 mg by mouth daily.  11/11/13  Yes [provider]  Vitamin D, Ergocalciferol, (DRISDOL) 50000 UNITS CAPS capsule Take 50,000 Units by mouth every 7 (seven) days.  12/29/13  Yes [provider]  cyclobenzaprine (FLEXERIL) 10 MG tablet Take 10 mg by mouth 3 (three) times daily as needed for muscle spasms.    [provider]  docusate sodium (COLACE) 100 MG capsule Take 100 mg by mouth 3 (three) times daily as needed for mild constipation.    [provider]  ipratropium-albuterol (DUONEB) 0.5-2.5 (3) MG/3ML SOLN Inhale into the lungs.    [provider]  levofloxacin (LEVAQUIN) 500 MG tablet Take 1 tablet (500 mg total) by mouth every other day. Patient not taking: Reported on 09/18/2017 09/13/17 09/18/17  Carrie Mew, MD  Lidocaine HCl 4 % GEL Apply 1 Application topically once daily as needed. 03/26/17 03/26/18  [provider]  mometasone (NASONEX) 50 MCG/ACT nasal spray Place 2 sprays into the nose daily.    [provider]  senna (SENOKOT) 8.6 MG tablet Take 1 tablet by mouth daily.    [provider]  topiramate (TOPAMAX) 25 MG tablet Take 25 mg by mouth as needed.  01/14/14   [provider]    Allergies Codeine; Enalapril maleate; Nitrofurantoin; Sulfamethoxazole-trimethoprim; Vicodin [hydrocodone-acetaminophen]; 2,4-d dimethylamine (amisol); Baclofen; Neosporin [neomycin-bacitracin zn-polymyx]; Quinine; Ultram [tramadol]; Zocor [simvastatin]; Bactrim [sulfamethoxazole-trimethoprim];  Macrodantin [nitrofurantoin macrocrystal]; Quinine derivatives; and Vasotec [enalapril]  Family History  Problem Relation Age of Onset  . Hypertension Mother   . Hyperlipidemia Mother   . Heart disease Father   . Heart attack Father 82  . Hypertension Father   . Hyperlipidemia Father   . Heart disease Brother        CABG   . Heart attack Brother   . Breast cancer Neg Hx     Social History Social History  Substance Use Topics  . Smoking status: Never Smoker  . Smokeless tobacco: Never Used  . Alcohol use No    Review of Systems  Constitutional: No fever/chills Eyes: No visual changes. ENT: No sore throat. Cardiovascular: As above Respiratory: Denies shortness of breath. Gastrointestinal: No abdominal pain.  No nausea, no vomiting.  No diarrhea.  No constipation. Genitourinary: Negative for dysuria. Musculoskeletal: Negative for back pain. Skin: Negative for rash. Neurological: Negative for headaches, focal weakness or numbness.   ____________________________________________   PHYSICAL EXAM:  VITAL SIGNS: ED Triage Vitals [09/18/17 1037]  Enc Vitals Group  BP (!) 139/53     Pulse Rate 72     Resp 18     Temp 99 F (37.2 C)     Temp Source Oral     SpO2 99 %     Weight 139 lb (63 kg)     Height 4\' 10"  (1.473 m)     Head Circumference      Peak Flow      Pain Score 8     Pain Loc      Pain Edu?      Excl. in [place]?     Constitutional: Alert and oriented. Well appearing and in no acute distress. Eyes: Conjunctivae are normal.  Head: Atraumatic. Nose: No congestion/rhinnorhea. Mouth/Throat: Mucous membranes are moist.  Neck: No stridor.   Cardiovascular: Normal rate, regular rhythm.  3 out of 6 systolic ejection murmur present. Good peripheral circulation with palpable thrill to the left forearm dialysis fistula.  Chest pain is reproducible over the sternum Respiratory: Normal respiratory effort.  No retractions. Lungs CTAB. Gastrointestinal: Soft and  nontender. No distention. Musculoskeletal: No lower extremity tenderness nor edema.  No joint effusions. Neurologic:  Normal speech and language. No gross focal neurologic deficits are appreciated. Skin:  Skin is warm, dry and intact. No rash noted. Psychiatric: Mood and affect are normal. Speech and behavior are normal.  ____________________________________________   LABS (all labs ordered are listed, but only abnormal results are displayed)  Labs Reviewed  BASIC METABOLIC PANEL - Abnormal; Notable for the following:       Result Value   Chloride 97 (*)    Glucose, Bld 111 (*)    BUN 28 (*)    Creatinine, Ser 7.79 (*)    Calcium 8.6 (*)    GFR calc non Af Amer 5 (*)    GFR calc Af Amer 5 (*)    All other components within normal limits  CBC - Abnormal; Notable for the following:    RBC 3.24 (*)    Hemoglobin 9.5 (*)    HCT 28.7 (*)    All other components within normal limits  TROPONIN I - Abnormal; Notable for the following:    Troponin I 0.03 (*)    All other components within normal limits  TROPONIN I   ____________________________________________  EKG  ED ECG REPORT I, Doran Stabler, the attending physician, personally viewed and interpreted this ECG.   Date: 09/18/2017  EKG Time: 10 37  Rate: 73  Rhythm: normal sinus rhythm  Axis: Normal  Intervals:Prolonged QT  ST&T Change: No ST segment elevation or depression.  No abnormal T wave inversion.  ____________________________________________  RADIOLOGY  Small bilateral pleural effusions. ____________________________________________   PROCEDURES  Procedure(s) performed:  Procedures  Critical Care performed:   ____________________________________________   INITIAL IMPRESSION / ASSESSMENT AND PLAN / ED COURSE  Pertinent labs & imaging results that were available during my care of the patient were reviewed by me and considered in my medical decision making (see chart for  details).  Differential diagnosis includes, but is not limited to, ACS, aortic dissection, pulmonary embolism, cardiac tamponade, pneumothorax, pneumonia, pericarditis/myocarditis, GI-related causes including esophagitis/gastritis, and musculoskeletal chest wall pain.    As part of my medical decision making, I reviewed the following data within the electronic MEDICAL RECORD NUMBER Notes from prior ED visits  ----------------------------------------- 12:23 PM on 09/18/2017 -----------------------------------------  Discussed the case with Dr. and of cardiology who agrees that if the patient seems low risk that she should  be okay to follow-up in the office.  I also discussed the case with Dr. Zollie Scale of nephrology who says that he will schedule the patient for third shift tomorrow at 2 or 3 PM to be dialyzed.    ----------------------------------------- 1:16 PM on 09/18/2017 -----------------------------------------  Patient with persistent chest pain despite fentanyl.  Patient will be admitted to the hospital.  Patient also complaining of feeling jittery and anxious and asking for anxiety medications.  Signed out to Dr. Earleen Newport.   ____________________________________________   FINAL CLINICAL IMPRESSION(S) / ED DIAGNOSES  Chest pain     NEW MEDICATIONS STARTED DURING THIS VISIT:  New Prescriptions   No medications on file     Note:  This document was prepared using Dragon voice recognition software and may include unintentional dictation errors.     Orbie Pyo, MD 09/18/17 [phone]

## 2017-09-18 NOTE — Progress Notes (Signed)
Admitted for chest pain and shortness of breath,alert and oriented x4,troponins positive and cardiologist consultation done,high fall risk and uses a cane.

## 2017-09-18 NOTE — Telephone Encounter (Signed)
I was contacted by Dr. Clearnce Hasten in the [facility] ED regarding Phyllis [name], who presented with an episode of chest pain.  She has previously seen Dr. Rockey Situ, with low risk, albeit difficult to interpret, nuclear stress test in 08/2015.  Initial EKG shows no acute ischemic changes.  First troponin is 0.03, which is non-specific in the setting of ESRD.  Dr. Clearnce Hasten  plans to check delta troponin and discharge the patient with outpatient follow-up if there is no significant bump in troponin.  Assuming that she is chest pain-free without rising troponin, I think this is a reasonable plan.  I will forward this to Dr. Rockey Situ for his review.  [name], MD [facility] Pager: [phone]

## 2017-09-18 NOTE — Telephone Encounter (Signed)
She may benefit from clinic follow-up to discuss

## 2017-09-18 NOTE — ED Triage Notes (Signed)
States chest pain and SOB since yesterday, states she is scheduled for dialysis today at 1130 but states her chest pain was too bad, awake and alert in no acute distress

## 2017-09-18 NOTE — ED Notes (Signed)
Unable to call report at this time due to floor RN being unavailable at this time.

## 2017-09-18 NOTE — H&P (Signed)
[facility] [place] NAME: Phyllis [name]    MR#:  [id number]  DATE OF BIRTH:  11-02-1946  DATE OF ADMISSION:  09/18/2017  PRIMARY CARE PHYSICIAN: [name], MD   REQUESTING/REFERRING PHYSICIAN: Dr Clearnce Hasten  CHIEF COMPLAINT:   Chief Complaint  Patient presents with  . Chest Pain    HISTORY OF PRESENT ILLNESS:  Phyllis [name]  is a 71 y.o. female presents with chest pain.  Patient states that she came into the ER last week with an allergic reaction to Levaquin.  She took 2 days of this medication.  Since then she is feeling all nervous inside.  She started developing chest pressure yesterday.  It was on and off for about 30 minutes at a time.  This morning it became more constant.  She thought taking a deep breath helps a little bit.  Severe in nature center of her chest.  Reproducible when she presses on her chest.  In the ER, the first cardiac enzyme was negative and EKG was unremarkable.  The patient has had a stress test in 2016 and 2017 that was negative.  The patient states that she does not want to do a stress test if possible.  PAST MEDICAL HISTORY:   Past Medical History:  Diagnosis Date  . Allergy   . Asthma   . Chronic kidney disease   . COPD (chronic obstructive pulmonary disease) ([place])   . Diabetes mellitus without complication ([place])   . Dialysis patient ([place])    3 days every week.   . Dialysis patient ([place])   . Fistula    lower left arm  . GERD (gastroesophageal reflux disease)   . Gout   . Hypertension   . 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 Sleep apnea     PAST SURGICAL HISTORY:   Past Surgical History:  Procedure Laterality Date  . carpel tunnel    . GALLBLADDER SURGERY    . PERIPHERAL VASCULAR CATHETERIZATION N/A 04/12/2015   Procedure: A/V Shuntogram/Fistulagram;  Surgeon: Algernon Huxley, MD;  Location: [facility];  Service: Cardiovascular;  Laterality:  N/A;  . PERIPHERAL VASCULAR CATHETERIZATION N/A 04/12/2015   Procedure: A/V Shunt Intervention;  Surgeon: Algernon Huxley, MD;  Location: [facility];  Service: Cardiovascular;  Laterality: N/A;  . PERIPHERAL VASCULAR CATHETERIZATION N/A 06/09/2015   Procedure: Dialysis/Perma Catheter Removal;  Surgeon: Katha Cabal, MD;  Location: [facility];  Service: Cardiovascular;  Laterality: N/A;    SOCIAL HISTORY:   Social History  Substance Use Topics  . Smoking status: Never Smoker  . Smokeless tobacco: Never Used  . Alcohol use No    FAMILY HISTORY:   Family History  Problem Relation Age of Onset  . Hypertension Mother   . Hyperlipidemia Mother   . Heart disease Father   . Heart attack Father 77  . Hypertension Father   . Hyperlipidemia Father   . Heart disease Brother        CABG   . Heart attack Brother   . Breast cancer Neg Hx     DRUG ALLERGIES:   Allergies  Allergen Reactions  . Codeine Nausea And Vomiting and Nausea Only  . Enalapril Maleate     Other reaction(s): Headache  . Nitrofurantoin Swelling and Rash    Other Reaction: swelling of body  . Sulfamethoxazole-Trimethoprim Swelling  . Vicodin [Hydrocodone-Acetaminophen] Nausea And Vomiting and Nausea Only  .  2,4-D Dimethylamine (Amisol) Rash    Other Reaction: h/a  . Baclofen Other (See Comments) and Nausea Only    lightheadness ,drowsiness , muscle weakness , twitching in hands   . Neosporin [Neomycin-Bacitracin Zn-Polymyx] Other (See Comments) and Rash    Other Reaction: irritation Skin irritation   . Quinine Rash    Other Reaction: Vomiting rash, h/a, vision  . Ultram [Tramadol] Palpitations  . Zocor [Simvastatin] Other (See Comments) and Rash    Other Reaction: muscle spasms Muscle pain and spasms  . Bactrim [Sulfamethoxazole-Trimethoprim] Swelling  . Macrodantin [Nitrofurantoin Macrocrystal] Swelling  . Quinine Derivatives Other (See Comments)    Vertigo,nausea vomiting blurred  vision headache ears sensitivity   . Vasotec [Enalapril] Other (See Comments)    Headaches     REVIEW OF SYSTEMS:  CONSTITUTIONAL: No fever, fatigue or weakness.  EYES: No blurred or double vision.  Wears glasses. EARS, NOSE, AND THROAT: No tinnitus or ear pain. No sore throat RESPIRATORY: No cough, some shortness of breath, wheezing or hemoptysis.  CARDIOVASCULAR: Positive for chest pain, no orthopnea, edema.  GASTROINTESTINAL: No nausea, vomiting, diarrhea or abdominal pain. No blood in bowel movements GENITOURINARY: No dysuria, hematuria.  ENDOCRINE: No polyuria, nocturia,  HEMATOLOGY: No anemia, easy bruising or bleeding SKIN: No rash or lesion. MUSCULOSKELETAL: Some left knee pain NEUROLOGIC: No tingling, numbness, weakness.  PSYCHIATRY: Nervous feeling since taking Levaquin  MEDICATIONS AT HOME:   Prior to Admission medications   Medication Sig Start Date End Date Taking? Authorizing Provider  albuterol (PROVENTIL HFA;VENTOLIN HFA) 108 (90 Base) MCG/ACT inhaler Inhale 2 puffs into the lungs every 6 (six) hours as needed for wheezing or shortness of breath.   Yes [provider]  amLODipine (NORVASC) 10 MG tablet Take 10 mg by mouth.  12/29/13  Yes [provider]  calcium acetate, Phos Binder, (PHOSLYRA) 667 MG/5ML SOLN Take by mouth 3 (three) times daily with meals.   Yes [provider]  carvedilol (COREG) 12.5 MG tablet Take 12.5 mg by mouth 2 (two) times daily with a meal.  01/09/14  Yes [provider]  cetirizine (ZYRTEC) 10 MG tablet Take 10 mg by mouth as needed for allergies.   Yes [provider]  Fluticasone-Salmeterol (ADVAIR) 250-50 MCG/DOSE AEPB Inhale 1 puff into the lungs 2 (two) times daily.   Yes [provider]  HYDROcodone-acetaminophen (NORCO) 5-325 MG tablet Take 1 tablet by mouth every 6 (six) hours as needed for moderate pain or severe pain. 08/16/17  Yes Eula Listen, MD  omeprazole (PRILOSEC)  20 MG capsule Take 20 mg by mouth 2 (two) times daily before a meal.   Yes [provider]  ondansetron (ZOFRAN) 4 MG tablet Take 4 mg by mouth every 8 (eight) hours as needed for nausea or vomiting.   Yes [provider]  pravastatin (PRAVACHOL) 40 MG tablet Take 40 mg by mouth daily.  12/29/13  Yes [provider]  VESICARE 5 MG tablet Take 5 mg by mouth daily.  11/11/13  Yes [provider]  Vitamin D, Ergocalciferol, (DRISDOL) 50000 UNITS CAPS capsule Take 50,000 Units by mouth every 7 (seven) days.  12/29/13  Yes [provider]  cyclobenzaprine (FLEXERIL) 10 MG tablet Take 10 mg by mouth 3 (three) times daily as needed for muscle spasms.    [provider]  docusate sodium (COLACE) 100 MG capsule Take 100 mg by mouth 3 (three) times daily as needed for mild constipation.    [provider]  ipratropium-albuterol (DUONEB) 0.5-2.5 (3) MG/3ML SOLN Inhale into the lungs.    [provider]  Lidocaine HCl 4 % GEL Apply 1 Application topically once daily as needed. 03/26/17 03/26/18  [provider]  mometasone (NASONEX) 50 MCG/ACT nasal spray Place 2 sprays into the nose daily.    [provider]  senna (SENOKOT) 8.6 MG tablet Take 1 tablet by mouth daily.    [provider]  topiramate (TOPAMAX) 25 MG tablet Take 25 mg by mouth as needed.  01/14/14   [provider]      VITAL SIGNS:  Blood pressure (!) 148/56, pulse 80, temperature 99 F (37.2 C), temperature source Oral, resp. rate 16, height 4\' 10"  (1.473 m), weight 63 kg (139 lb), SpO2 96 %.  PHYSICAL EXAMINATION:  GENERAL:  71 y.o.-year-old patient lying in the bed with no acute distress.  EYES: Pupils equal, round, reactive to light and accommodation. No scleral icterus. Extraocular muscles intact.  HEENT: Head atraumatic, normocephalic. Oropharynx and nasopharynx clear.  NECK:  Supple, no jugular venous distention. No thyroid  enlargement, no tenderness.  LUNGS: Decreased breath sounds bilaterally, no wheezing, rales,rhonchi or crepitation. No use of accessory muscles of respiration.  CARDIOVASCULAR: S1, S2 normal.  2 out of 6 systolic murmurs.  No rubs, or gallops.  Chest pain reproducible palpation over sternum. ABDOMEN: Soft, nontender, nondistended. Bowel sounds present. No organomegaly or mass.  EXTREMITIES: No pedal edema, cyanosis, or clubbing.  NEUROLOGIC: Cranial nerves II through XII are intact. Muscle strength 5/5 in all extremities. Sensation intact. Gait not checked.  PSYCHIATRIC: The patient is alert and oriented x 3.  SKIN: No rash, lesion, or ulcer.   LABORATORY PANEL:   CBC  Recent Labs Lab 09/18/17 1052  WBC 6.7  HGB 9.5*  HCT 28.7*  PLT 246   ------------------------------------------------------------------------------------------------------------------  Chemistries   Recent Labs Lab 09/15/17 1509 09/18/17 1052  NA 142 138  K 3.2* 3.7  CL 101 97*  CO2 29 27  GLUCOSE 136* 111*  BUN 11 28*  CREATININE 3.67* 7.79*  CALCIUM 8.6* 8.6*  AST 19  --   ALT 9*  --   ALKPHOS 60  --   BILITOT 0.6  --    ------------------------------------------------------------------------------------------------------------------  Cardiac Enzymes  Recent Labs Lab 09/18/17 1052  TROPONINI 0.03*   ------------------------------------------------------------------------------------------------------------------  RADIOLOGY:  Dg Chest 2 View  Result Date: 09/18/2017 CLINICAL DATA:  Chest pain on and off since yesterday. EXAM: CHEST  2 VIEW COMPARISON:  09/14/2017 FINDINGS: The heart is normal in size and stable. There is significant tortuosity and calcification of the thoracic aorta. The pulmonary hila appear grossly normal in stable. Chronic bronchitic type interstitial lung changes along with streaky areas of atelectasis. No focal pulmonary infiltrates. There are small bilateral pleural  effusions noted. The bony thorax is intact. IMPRESSION: Chronic bronchitic type interstitial lung changes but no focal infiltrate. Small bilateral pleural effusions. Electronically Signed   By: Marijo Sanes M.D.   On: 09/18/2017 12:19    EKG:   Normal sinus rhythm 73 bpm no acute ST-T wave changes.  IMPRESSION AND PLAN:   1.  Atypical chest pain.  Monitor overnight on telemetry.  Serial cardiac enzymes.  Since chest pain is reproducible this is most likely a costochondritis.  Will give IV Solu-Medrol 40 mg IV daily.  Aspirin given in the ER.  Chest x-ray showing chronic bronchitic interstitial lung changes.  Will also give nebulizer treatment. 2.  End-stage renal disease on dialysis twice a  week.  Case discussed with Dr. Holley Raring nephrology and he will set up outpatient dialysis for tomorrow at 4 PM. 3.  Essential hypertension continue usual medications 4.  GERD on PPI 5.  Osteoarthritis 6.  History of COPD.  Will give nebulizer treatment.  All the records are reviewed and case discussed with ED provider. Management plans discussed with the patient, family and they are in agreement.  CODE STATUS: DNR  TOTAL TIME TAKING CARE OF THIS PATIENT: 50 minutes.    Loletha Grayer M.D on 09/18/2017 at 1:53 PM  Between 7am to 6pm - Pager - [phone]  After 6pm call admission pager [phone]  [facility] Office  [phone]  CC: Primary care physician; [name], MD

## 2017-09-18 NOTE — ED Notes (Signed)
Dr. Clearnce Hasten aware of Troponin 0.03.

## 2017-09-19 ENCOUNTER — Ambulatory Visit: Payer: Medicare Other | Admitting: Physical Therapy

## 2017-09-19 DIAGNOSIS — R0789 Other chest pain: Secondary | ICD-10-CM | POA: Diagnosis not present

## 2017-09-19 LAB — CBC
HCT: 26.4 % — ABNORMAL LOW (ref 35.0–47.0)
Hemoglobin: 8.9 g/dL — ABNORMAL LOW (ref 12.0–16.0)
MCH: 29.6 pg (ref 26.0–34.0)
MCHC: 33.6 g/dL (ref 32.0–36.0)
MCV: 87.9 fL (ref 80.0–100.0)
Platelets: 216 10*3/uL (ref 150–440)
RBC: 3.01 MIL/uL — ABNORMAL LOW (ref 3.80–5.20)
RDW: 14.6 % — ABNORMAL HIGH (ref 11.5–14.5)
WBC: 4.2 10*3/uL (ref 3.6–11.0)

## 2017-09-19 LAB — BASIC METABOLIC PANEL
Anion gap: 13 (ref 5–15)
BUN: 34 mg/dL — ABNORMAL HIGH (ref 6–20)
CO2: 24 mmol/L (ref 22–32)
Calcium: 8.7 mg/dL — ABNORMAL LOW (ref 8.9–10.3)
Chloride: 98 mmol/L — ABNORMAL LOW (ref 101–111)
Creatinine, Ser: 7.93 mg/dL — ABNORMAL HIGH (ref 0.44–1.00)
GFR calc Af Amer: 5 mL/min — ABNORMAL LOW (ref >60)
GFR calc non Af Amer: 5 mL/min — ABNORMAL LOW (ref >60)
Glucose, Bld: 171 mg/dL — ABNORMAL HIGH (ref 65–99)
Potassium: 4.1 mmol/L (ref 3.5–5.1)
Sodium: 135 mmol/L (ref 135–145)

## 2017-09-19 LAB — LIPID PANEL
Cholesterol: 159 mg/dL (ref 0–200)
HDL: 53 mg/dL (ref >40)
LDL Cholesterol: 82 mg/dL (ref 0–99)
Total CHOL/HDL Ratio: 3 RATIO
Triglycerides: 118 mg/dL (ref <150)
VLDL: 24 mg/dL (ref 0–40)

## 2017-09-19 MED ORDER — ASPIRIN 81 MG PO TBEC: 81.0000 mg | DELAYED_RELEASE_TABLET | Freq: Every day | ORAL | Status: DC

## 2017-09-19 NOTE — Progress Notes (Signed)
Pt discharged to home via wc.  Instructions and rx given to pt.  Questions answered.  No distress.  

## 2017-09-19 NOTE — Plan of Care (Signed)
Problem: Safety: Goal: Ability to remain free from injury will improve Outcome: Progressing Fall precautions in place, non skid socks when oob  Problem: Pain Managment: Goal: General experience of comfort will improve Outcome: Progressing Prn medications  Problem: Tissue Perfusion: Goal: Risk factors for ineffective tissue perfusion will decrease Outcome: Progressing SQ Heparin  Problem: Fluid Volume: Goal: Ability to maintain a balanced intake and output will improve Outcome: Progressing Daily weighs, intake and output  Problem: Education: Goal: Knowledge of disease and its progression will improve Outcome: Progressing Renal diet with 126ml FR  Problem: Fluid Volume: Goal: Compliance with measures to maintain balanced fluid volume will improve Outcome: Progressing Daily weights, intake and output

## 2017-09-19 NOTE — Telephone Encounter (Signed)
Patient currently admitted at this time. 

## 2017-09-19 NOTE — Care Management Obs Status (Signed)
[facility] NOTIFICATION   Patient Details  Name: Phyllis [name] MRN: [id number] Date of Birth: 1946-04-25   Medicare Observation Status Notification Given:  Yes Notice signed, one given to patient and the other to HIM for scanning    Katrina Stack, RN 09/19/2017, 10:25 AM

## 2017-09-19 NOTE — Discharge Summary (Addendum)
[facility] [name] NAME: Phyllis [name]    MR#:  [id number]  DATE OF BIRTH:  [birth year]  DATE OF ADMISSION:  09/18/2017 ADMITTING PHYSICIAN: Loletha Grayer, MD  DATE OF DISCHARGE: 09/19/2017  PRIMARY CARE PHYSICIAN: [name], MD    ADMISSION DIAGNOSIS:  Nonspecific chest pain [R07.9]  DISCHARGE DIAGNOSIS:  Active Problems:   Chest pain   SECONDARY DIAGNOSIS:   Past Medical History:  Diagnosis Date  . Allergy   . Asthma   . Chronic kidney disease   . COPD (chronic obstructive pulmonary disease) ([place])   . Diabetes mellitus without complication ([place])   . Dialysis patient ([place])    3 days every week.   . Dialysis patient ([facility])   . Fistula    lower left arm  . GERD (gastroesophageal reflux disease)   . Gout   . Hypertension   . 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 Sleep apnea     HOSPITAL COURSE:   71 year old female with end-stage renal disease on hemodialysis, CAD and COPD who presents with atypical chest pain/pressure.  1. ATypical chest pain: Patient's headache enzymes were negative. She had no chest pain during hospital stay. She will follow-up withcardiology as an outpatient.  2. End-stage renal disease on hemodialysis: Patient will have outpatient dialysis which has been scheduled by Dr. Holley Raring today at 4:00 and then will resume her regular schedule dialysis.  3. Essential hypertension:continue Norvasc and Coreg  4. Hyperlipidemia: Continue statin  5. CAD: Continue Norvasc, Coregaspirin and pravastatin DISCHARGE CONDITIONS AND DIET:   Stable for discharge on heart healthy diet  CONSULTS OBTAINED:    DRUG ALLERGIES:   Allergies  Allergen Reactions  . Codeine Nausea And Vomiting and Nausea Only  . Enalapril Maleate     Other reaction(s): Headache  . Nitrofurantoin Swelling and Rash    Other Reaction: swelling of body  . Sulfamethoxazole-Trimethoprim Swelling  .  Vicodin [Hydrocodone-Acetaminophen] Nausea And Vomiting and Nausea Only  . 2,4-D Dimethylamine (Amisol) Rash    Other Reaction: h/a  . Baclofen Other (See Comments) and Nausea Only    lightheadness ,drowsiness , muscle weakness , twitching in hands   . Neosporin [Neomycin-Bacitracin Zn-Polymyx] Other (See Comments) and Rash    Other Reaction: irritation Skin irritation   . Quinine Rash    Other Reaction: Vomiting rash, h/a, vision  . Ultram [Tramadol] Palpitations  . Zocor [Simvastatin] Other (See Comments) and Rash    Other Reaction: muscle spasms Muscle pain and spasms  . Bactrim [Sulfamethoxazole-Trimethoprim] Swelling  . Macrodantin [Nitrofurantoin Macrocrystal] Swelling  . Quinine Derivatives Other (See Comments)    Vertigo,nausea vomiting blurred vision headache ears sensitivity   . Vasotec [Enalapril] Other (See Comments)    Headaches     DISCHARGE MEDICATIONS:   Current Discharge Medication List    START taking these medications   Details  aspirin EC 81 MG EC tablet Take 1 tablet (81 mg total) by mouth daily.      CONTINUE these medications which have NOT CHANGED   Details  albuterol (PROVENTIL HFA;VENTOLIN HFA) 108 (90 Base) MCG/ACT inhaler Inhale 2 puffs into the lungs every 6 (six) hours as needed for wheezing or shortness of breath.    amLODipine (NORVASC) 10 MG tablet Take 10 mg by mouth.     calcium acetate, Phos Binder, (PHOSLYRA) 667 MG/5ML SOLN Take by mouth 3 (three) times daily with meals.    carvedilol (COREG) 12.5  MG tablet Take 12.5 mg by mouth 2 (two) times daily with a meal.     cetirizine (ZYRTEC) 10 MG tablet Take 10 mg by mouth as needed for allergies.    Fluticasone-Salmeterol (ADVAIR) 250-50 MCG/DOSE AEPB Inhale 1 puff into the lungs 2 (two) times daily.    HYDROcodone-acetaminophen (NORCO) 5-325 MG tablet Take 1 tablet by mouth every 6 (six) hours as needed for moderate pain or severe pain. Qty: 10 tablet, Refills: 0    omeprazole  (PRILOSEC) 20 MG capsule Take 20 mg by mouth 2 (two) times daily before a meal.    ondansetron (ZOFRAN) 4 MG tablet Take 4 mg by mouth every 8 (eight) hours as needed for nausea or vomiting.    pravastatin (PRAVACHOL) 40 MG tablet Take 40 mg by mouth daily.     VESICARE 5 MG tablet Take 5 mg by mouth daily.     Vitamin D, Ergocalciferol, (DRISDOL) 50000 UNITS CAPS capsule Take 50,000 Units by mouth every 7 (seven) days.     cyclobenzaprine (FLEXERIL) 10 MG tablet Take 10 mg by mouth 3 (three) times daily as needed for muscle spasms.    docusate sodium (COLACE) 100 MG capsule Take 100 mg by mouth 3 (three) times daily as needed for mild constipation.    ipratropium-albuterol (DUONEB) 0.5-2.5 (3) MG/3ML SOLN Inhale into the lungs.    Lidocaine HCl 4 % GEL Apply 1 Application topically once daily as needed.    mometasone (NASONEX) 50 MCG/ACT nasal spray Place 2 sprays into the nose daily.    senna (SENOKOT) 8.6 MG tablet Take 1 tablet by mouth daily.    topiramate (TOPAMAX) 25 MG tablet Take 25 mg by mouth as needed.           Today   CHIEF COMPLAINT:   No chest pain overnight   VITAL SIGNS:  Blood pressure (!) 134/48, pulse 82, temperature 98.2 F (36.8 C), temperature source Oral, resp. rate 17, height 4\' 10"  (1.473 m), weight 63 kg (138 lb 12.8 oz), SpO2 96 %.   REVIEW OF SYSTEMS:  Review of Systems  Constitutional: Negative.  Negative for chills, fever and malaise/fatigue.  HENT: Negative.  Negative for ear discharge, ear pain, hearing loss, nosebleeds and sore throat.   Eyes: Negative.  Negative for blurred vision and pain.  Respiratory: Negative.  Negative for cough, hemoptysis, shortness of breath and wheezing.   Cardiovascular: Negative.  Negative for chest pain, palpitations and leg swelling.  Gastrointestinal: Negative.  Negative for abdominal pain, blood in stool, diarrhea, nausea and vomiting.  Genitourinary: Negative.  Negative for dysuria.   Musculoskeletal: Negative.  Negative for back pain.  Skin: Negative.   Neurological: Negative for dizziness, tremors, speech change, focal weakness, seizures and headaches.  Endo/Heme/Allergies: Negative.  Does not bruise/bleed easily.  Psychiatric/Behavioral: Negative.  Negative for depression, hallucinations and suicidal ideas.     PHYSICAL EXAMINATION:  GENERAL:  71 y.o.-year-old patient lying in the bed with no acute distress.  NECK:  Supple, no jugular venous distention. No thyroid enlargement, no tenderness.  LUNGS: Normal breath sounds bilaterally, no wheezing, rales,rhonchi  No use of accessory muscles of respiration.  CARDIOVASCULAR: S1, S2 normal. No murmurs, rubs, or gallops.  ABDOMEN: Soft, non-tender, non-distended. Bowel sounds present. No organomegaly or mass.  EXTREMITIES: No pedal edema, cyanosis, or clubbing.  PSYCHIATRIC: The patient is alert and oriented x 3.  SKIN: No obvious rash, lesion, or ulcer.   DATA REVIEW:   CBC  Recent Labs Lab 09/19/17  0455  WBC 4.2  HGB 8.9*  HCT 26.4*  PLT 216    Chemistries   Recent Labs Lab 09/15/17 1509  09/19/17 0455  NA 142  < > 135  K 3.2*  < > 4.1  CL 101  < > 98*  CO2 29  < > 24  GLUCOSE 136*  < > 171*  BUN 11  < > 34*  CREATININE 3.67*  < > 7.93*  CALCIUM 8.6*  < > 8.7*  AST 19  --   --   ALT 9*  --   --   ALKPHOS 60  --   --   BILITOT 0.6  --   --   < > = values in this interval not displayed.  Cardiac Enzymes  Recent Labs Lab 09/18/17 1052 09/18/17 1534 09/18/17 1912  TROPONINI 0.03* 0.03* 0.03*    Microbiology Results  @[email or web address] @  RADIOLOGY:  Dg Chest 2 View  Result Date: 09/18/2017 CLINICAL DATA:  Chest pain on and off since yesterday. EXAM: CHEST  2 VIEW COMPARISON:  09/14/2017 FINDINGS: The heart is normal in size and stable. There is significant tortuosity and calcification of the thoracic aorta. The pulmonary hila appear grossly normal in stable. Chronic bronchitic type  interstitial lung changes along with streaky areas of atelectasis. No focal pulmonary infiltrates. There are small bilateral pleural effusions noted. The bony thorax is intact. IMPRESSION: Chronic bronchitic type interstitial lung changes but no focal infiltrate. Small bilateral pleural effusions. Electronically Signed   By: Marijo Sanes M.D.   On: 09/18/2017 12:19      Current Discharge Medication List    START taking these medications   Details  aspirin EC 81 MG EC tablet Take 1 tablet (81 mg total) by mouth daily.      CONTINUE these medications which have NOT CHANGED   Details  albuterol (PROVENTIL HFA;VENTOLIN HFA) 108 (90 Base) MCG/ACT inhaler Inhale 2 puffs into the lungs every 6 (six) hours as needed for wheezing or shortness of breath.    amLODipine (NORVASC) 10 MG tablet Take 10 mg by mouth.     calcium acetate, Phos Binder, (PHOSLYRA) 667 MG/5ML SOLN Take by mouth 3 (three) times daily with meals.    carvedilol (COREG) 12.5 MG tablet Take 12.5 mg by mouth 2 (two) times daily with a meal.     cetirizine (ZYRTEC) 10 MG tablet Take 10 mg by mouth as needed for allergies.    Fluticasone-Salmeterol (ADVAIR) 250-50 MCG/DOSE AEPB Inhale 1 puff into the lungs 2 (two) times daily.    HYDROcodone-acetaminophen (NORCO) 5-325 MG tablet Take 1 tablet by mouth every 6 (six) hours as needed for moderate pain or severe pain. Qty: 10 tablet, Refills: 0    omeprazole (PRILOSEC) 20 MG capsule Take 20 mg by mouth 2 (two) times daily before a meal.    ondansetron (ZOFRAN) 4 MG tablet Take 4 mg by mouth every 8 (eight) hours as needed for nausea or vomiting.    pravastatin (PRAVACHOL) 40 MG tablet Take 40 mg by mouth daily.     VESICARE 5 MG tablet Take 5 mg by mouth daily.     Vitamin D, Ergocalciferol, (DRISDOL) 50000 UNITS CAPS capsule Take 50,000 Units by mouth every 7 (seven) days.     cyclobenzaprine (FLEXERIL) 10 MG tablet Take 10 mg by mouth 3 (three) times daily as needed for  muscle spasms.    docusate sodium (COLACE) 100 MG capsule Take 100 mg by mouth 3 (three) times daily  as needed for mild constipation.    ipratropium-albuterol (DUONEB) 0.5-2.5 (3) MG/3ML SOLN Inhale into the lungs.    Lidocaine HCl 4 % GEL Apply 1 Application topically once daily as needed.    mometasone (NASONEX) 50 MCG/ACT nasal spray Place 2 sprays into the nose daily.    senna (SENOKOT) 8.6 MG tablet Take 1 tablet by mouth daily.    topiramate (TOPAMAX) 25 MG tablet Take 25 mg by mouth as needed.             Management plans discussed with the patient and she is in agreement. Stable for discharge home  Patient should follow up with PCP  CODE STATUS:     Code Status Orders        Start     Ordered   09/18/17 1348  Do not attempt resuscitation (DNR)  Continuous    Question Answer Comment  In the event of cardiac or respiratory ARREST Do not call a "code blue"   In the event of cardiac or respiratory ARREST Do not perform Intubation, CPR, defibrillation or ACLS   In the event of cardiac or respiratory ARREST Use medication by any route, position, wound care, and other measures to relive pain and suffering. May use oxygen, suction and manual treatment of airway obstruction as needed for comfort.   Comments nurse may pronounce      09/18/17 1348    Code Status History    Date Active Date Inactive Code Status Order ID Comments User Context   04/12/2015 10:56 AM 04/12/2015  4:31 PM Full Code [id number]  Algernon Huxley, MD Inpatient      TOTAL TIME TAKING CARE OF THIS PATIENT: 37 minutes.    Note: This dictation was prepared with Dragon dictation along with smaller phrase technology. Any transcriptional errors that result from this process are unintentional.  Bohdi Leeds M.D on 09/19/2017 at 11:25 AM  Between 7am to 6pm - Pager - [phone] After 6pm go to [email or web address] - password [email or web address]  Office  [phone]  CC: Primary care  physician; [name], MD

## 2017-09-20 ENCOUNTER — Encounter: Payer: Self-pay | Admitting: Physician Assistant

## 2017-09-20 ENCOUNTER — Ambulatory Visit (INDEPENDENT_AMBULATORY_CARE_PROVIDER_SITE_OTHER): Payer: Medicare Other | Admitting: Physician Assistant

## 2017-09-20 VITALS — BP 130/62 | HR 69 | Ht <= 58 in | Wt 141.2 lb

## 2017-09-20 DIAGNOSIS — R079 Chest pain, unspecified: Secondary | ICD-10-CM

## 2017-09-20 DIAGNOSIS — I10 Essential (primary) hypertension: Secondary | ICD-10-CM

## 2017-09-20 DIAGNOSIS — W19XXXS Unspecified fall, sequela: Secondary | ICD-10-CM

## 2017-09-20 DIAGNOSIS — R0602 Shortness of breath: Secondary | ICD-10-CM | POA: Diagnosis not present

## 2017-09-20 DIAGNOSIS — N186 End stage renal disease: Secondary | ICD-10-CM | POA: Diagnosis not present

## 2017-09-20 DIAGNOSIS — R011 Cardiac murmur, unspecified: Secondary | ICD-10-CM

## 2017-09-20 DIAGNOSIS — I5032 Chronic diastolic (congestive) heart failure: Secondary | ICD-10-CM | POA: Diagnosis not present

## 2017-09-20 DIAGNOSIS — E782 Mixed hyperlipidemia: Secondary | ICD-10-CM

## 2017-09-20 NOTE — Progress Notes (Signed)
Cardiology Office Note Date:  09/20/2017  Patient ID:  Phyllis [name], DOB July 26, 1946, MRN [id number] PCP:  [name], MD  Cardiologist:  Dr. Rockey Situ, MD    Chief Complaint: Hospital follow up  History of Present Illness: Phyllis [name] is a 71 y.o. female with history of ESRD (small bilateral kidneys) on HD Tuesday and Saturday listed for transplant at [facility] since 10/2013, aortic atherosclerosis, DM2, COPD, HTN, HLD, anemia of chronic disease, chronic back and knee pain, anxiety, sleep apnea, and GERD who presents for hospital follow up of chest pain.   Prior echo in 01/2014 showed normal LV systolic function without any significant valvular abnormalities. Stress test in 01/2014 showed no evidence of ischemia. Repeat stress test 08/2015 showed no significant ischemia with significant GI uptake artifact, unable to estimate EF or wall motion, no significant EKG changes concerning for ischemia, low risk scan. TTE 09/2015 showed normal EF with mild LVH. Cardiopulmonary stress test at [facility] 10/06/2016, exercising for 3 minutes and 12 second on the bike, without incline, with a peak METs of 2.28. She achieved VO2 of 8.1, 48% of prediced, indicating moderate to severe functional impairment. Evidence of blunted heart rate, stroke volume and BP augmentation as well as ventilation-perfusion mismatch with exercise were noted. EKG was notable for baseline NSR with isolated PVCs with exercise. No st/t changes were noted with exercise. No pulmonary limitations. Patient felt like her test was limited by knee pain, though data indicated maximal CV performance. There were some concerns among the transplant team regarding her candidacy regarding the above CPET.   She was seen in the ED on 10/23 and diagnosed with PNA. BNP 664, WBC 8.8, HGB 9.2, SCr 6.32, K+ 3.4. CXR showed interstitial edema with pulmonary vascular congestion. EKG not acute. She was advised to follow up with dialysis, started on Levaquin,  and advised to follow up with PCP as an outpatient. She returend to the ED on 10/26 for possible allergic reaction. She was noted to be hypoxic with ocygen saturation of 89% on room air. Repeat CXR showed atypical PNA vs vascular congestion with small bilateral pleural effusions. Repeat BNP was 578, troponin negative x 1, WBC 6.6, HGB 9.7, SCr 6.23, K+ 3.7. She was given IV Lasix with improvement in breathing. Outpatient follow up was advised. She returned to the ED on 10/27 with sensation of anxiety. No chest pain or SOB. EKG not acute. CXR not performed. Labs showed SCr 3.67, K+ 3.2, WBC 6.6, HGB 9.3. She was advised to no longer take Levaquin and follow up with her PCP. She returned to the ED for a 4th time in 1 week on 10/30 with intermittent chest pain that was reproducible to palpation and was admitted. EKG not acute. Troponin 0.03 x 3, CXR showed small bilateral pleural effusions with changes consistent with chronic bronchitis. WBC 4.2, HGB 8.9, SCr 7.93, K+ 4.1, LDL 82. She declined stress testing and was advised to follow up with cardiology. She underwent HD as an outpatient on 10/31 following her discharge with subsequent plans to resume her regular schedule of Tuesday and Saturday.   Patient comes in today stating she has really noticed a decline in her functional capacity since suffering a fall in late September, 2018 leading to multiple staples being placed in her scalp. In late September she was attempting to push in the reclining function of her recliner when she fell backwards, hitting her head on her stereo. She is uncertain what led to the fall and does  not know if she just lost her balance. She denies any chest pain, palpitations, diaphoresis, dizziness, presyncope, or syncope around the time of the fall or afterwards. She reports she did not suffer LOC. She does report being fairly unsteady on her feet and ambulates with a cane and sometimes leaning on her friend for added support. She does not  have a walker. Since this fall, her functional status has been very limited, more so than her baseline limitations as noted above. Over the past 1 to 1.5 weeks she has noticed intermittent, substernal chest pain that is not associated with activities or exertion. Pain does not radiate from the center of her chest and improves with deep inspiration after about 30-45 minutes. She has never had pain like this before. There is no associated SOB, nausea, vomiting, diaphoresis, palpitations, dizziness, presyncope, or syncope. She denies any cough, lower extremity swelling, abdominal distension, early satiety, PND, or orthopnea. She does apply extra salt to her foods and eats out at restaurants ~ 1 time per month. She drinks less than 2 liters of fluids daily. She has not missed any dialysis sessions. She does not weight herself or check her BP at home. She last had an episode of chest pain the night prior (following her discharge from [facility]) that lasted ~ 30 minutes and improved with deep inspiration. She is currently symptom free. She is accompanied by her long time friend today.    Past Medical History:  Diagnosis Date  . Allergy   . Anemia of chronic disease   . Anxiety   . Aortic atherosclerosis ([place])   . Asthma   . Chronic back pain   . COPD (chronic obstructive pulmonary disease) ([place])   . Diabetes mellitus with complication ([place])   . ESRD on hemodialysis ([place])    a. Tues/Sat; b. 2/2 small kidneys  . Essential hypertension   . Fistula    lower left arm  . GERD (gastroesophageal reflux disease)   . Gout   . History of echocardiogram    a. TTE 01/2014: nl LV sys fxn, no valvular abnormalities; b. TTE 11/16: nl EF, mild LVH  . History of exercise stress test    a. 01/2014: no evidence of ischemia; b. Lexiscan 08/2015: no sig ischemia, severe GI uptake artifact, low risk; c. CPET @ [facility] 09/2016: exercised 3 min 12 sec on bike without incline, 2.28 METs, VO2 of 8.1, 48% of predicted, indicating mod to  sev functional impairment, evidence of blunted HR, stroke volume, and BP augmentation as well as ventilation-perfusion mismatch with exercise  . HLD (hyperlipidemia)   . 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 Sleep apnea     Past Surgical History:  Procedure Laterality Date  . carpel tunnel    . GALLBLADDER SURGERY    . PERIPHERAL VASCULAR CATHETERIZATION N/A 04/12/2015   Procedure: A/V Shuntogram/Fistulagram;  Surgeon: Algernon Huxley, MD;  Location: [place] [facility];  Service: Cardiovascular;  Laterality: N/A;  . PERIPHERAL VASCULAR CATHETERIZATION N/A 04/12/2015   Procedure: A/V Shunt Intervention;  Surgeon: Algernon Huxley, MD;  Location: [place] [facility];  Service: Cardiovascular;  Laterality: N/A;  . PERIPHERAL VASCULAR CATHETERIZATION N/A 06/09/2015   Procedure: Dialysis/Perma Catheter Removal;  Surgeon: Katha Cabal, MD;  Location: [place] [facility];  Service: Cardiovascular;  Laterality: N/A;    Current Meds  Medication Sig  . albuterol (PROVENTIL HFA;VENTOLIN HFA) 108 (90 Base) MCG/ACT inhaler Inhale 2 puffs into the  lungs every 6 (six) hours as needed for wheezing or shortness of breath.  Marland Kitchen amLODipine (NORVASC) 10 MG tablet Take 10 mg by mouth.   Marland Kitchen aspirin EC 81 MG EC tablet Take 1 tablet (81 mg total) by mouth daily.  . calcium acetate, Phos Binder, (PHOSLYRA) 667 MG/5ML SOLN Take by mouth 3 (three) times daily with meals.  . carvedilol (COREG) 12.5 MG tablet Take 12.5 mg by mouth 2 (two) times daily with a meal.   . cetirizine (ZYRTEC) 10 MG tablet Take 10 mg by mouth as needed for allergies.  . cyclobenzaprine (FLEXERIL) 10 MG tablet Take 10 mg by mouth 3 (three) times daily as needed for muscle spasms.  Marland Kitchen docusate sodium (COLACE) 100 MG capsule Take 100 mg by mouth 3 (three) times daily as needed for mild constipation.  . Fluticasone-Salmeterol (ADVAIR) 250-50 MCG/DOSE AEPB Inhale 1 puff into the lungs 2 (two) times daily.  Marland Kitchen  HYDROcodone-acetaminophen (NORCO) 5-325 MG tablet Take 1 tablet by mouth every 6 (six) hours as needed for moderate pain or severe pain.  Marland Kitchen ipratropium-albuterol (DUONEB) 0.5-2.5 (3) MG/3ML SOLN Inhale into the lungs.  . Lidocaine HCl 4 % GEL Apply 1 Application topically once daily as needed.  . mometasone (NASONEX) 50 MCG/ACT nasal spray Place 2 sprays into the nose daily.  Marland Kitchen omeprazole (PRILOSEC) 20 MG capsule Take 20 mg by mouth 2 (two) times daily before a meal.  . ondansetron (ZOFRAN) 4 MG tablet Take 4 mg by mouth every 8 (eight) hours as needed for nausea or vomiting.  . pravastatin (PRAVACHOL) 40 MG tablet Take 40 mg by mouth daily.   Marland Kitchen senna (SENOKOT) 8.6 MG tablet Take 1 tablet by mouth daily.  Marland Kitchen topiramate (TOPAMAX) 25 MG tablet Take 25 mg by mouth as needed.   . VESICARE 5 MG tablet Take 5 mg by mouth daily.   . Vitamin D, Ergocalciferol, (DRISDOL) 50000 UNITS CAPS capsule Take 50,000 Units by mouth every 7 (seven) days.     Allergies:   Codeine; Enalapril maleate; Nitrofurantoin; Sulfamethoxazole-trimethoprim; Vicodin [hydrocodone-acetaminophen]; 2,4-d dimethylamine (amisol); Baclofen; Neosporin [neomycin-bacitracin zn-polymyx]; Quinine; Ultram [tramadol]; Zocor [simvastatin]; Bactrim [sulfamethoxazole-trimethoprim]; Levodopa; Macrodantin [nitrofurantoin macrocrystal]; Quinine derivatives; and Vasotec [enalapril]   Social History:  The patient  reports that she has never smoked. She has never used smokeless tobacco. She reports that she does not drink alcohol or use drugs.   Family History:  The patient's family history includes Heart attack in her brother; Heart attack (age of onset: 28) in her father; Heart disease in her brother and father; Hyperlipidemia in her father and mother; Hypertension in her father and mother.  ROS:   Review of Systems  Constitutional: Positive for malaise/fatigue. Negative for chills, diaphoresis, fever and weight loss.  HENT: Negative for congestion.    Eyes: Negative for discharge and redness.  Respiratory: Positive for shortness of breath. Negative for cough, hemoptysis, sputum production and wheezing.   Cardiovascular: Positive for chest pain. Negative for palpitations, orthopnea, claudication, leg swelling and PND.  Gastrointestinal: Negative for abdominal pain, blood in stool, heartburn, melena, nausea and vomiting.  Genitourinary: Negative for hematuria.  Musculoskeletal: Positive for falls. Negative for myalgias.  Skin: Negative for rash.  Neurological: Positive for weakness. Negative for dizziness, tingling, tremors, sensory change, speech change, focal weakness, seizures, loss of consciousness and headaches.  Endo/Heme/Allergies: Does not bruise/bleed easily.  Psychiatric/Behavioral: Negative for substance abuse. The patient is not nervous/anxious.   All other systems reviewed and are negative.    PHYSICAL  EXAM:  VS:  BP 130/62 (BP Location: Right Arm, Patient Position: Sitting, Cuff Size: Normal)   Pulse 68   Ht 4\' 10"  (1.473 m)   Wt 141 lb 4 oz (64.1 kg)   BMI 29.52 kg/m  BMI: Body mass index is 29.52 kg/m.  Physical Exam  Constitutional: She is oriented to person, place, and time. She appears well-developed and well-nourished.  HENT:  Head: Normocephalic and atraumatic.  Eyes: Right eye exhibits no discharge. Left eye exhibits no discharge.  Neck: Normal range of motion. No JVD present.  Cardiovascular: Normal rate, regular rhythm, S1 normal and S2 normal.  Exam reveals no distant heart sounds, no friction rub, no midsystolic click and no opening snap.   Murmur heard. High-pitched blowing holosystolic murmur is present with a grade of 2/6  at the apex  Harsh midsystolic murmur of grade 2/6 is also present at the upper right sternal border radiating to the neck. Pulses:      Posterior tibial pulses are 2+ on the right side, and 2+ on the left side.  Palpation of the anterior chest wall does cause some discomfort,  though this pain is not the same pain she has been noting or same pain that led to her ED visits and hospital admission. She indicates when her chest was palpated in the hospital it too caused some discomfort, though it was not the same pain she had been noting.   Pulmonary/Chest: Effort normal and breath sounds normal. No respiratory distress. She has no decreased breath sounds. She has no wheezes. She has no rales. She exhibits no tenderness.  Abdominal: Soft. She exhibits no distension. There is no tenderness.  Musculoskeletal: She exhibits no edema.  Neurological: She is alert and oriented to person, place, and time.  Skin: Skin is warm and dry. No cyanosis. Nails show no clubbing.  Psychiatric: She has a normal mood and affect. Her speech is normal and behavior is normal. Judgment and thought content normal.    EKG:  Was ordered and interpreted by me today. Shows NSR, 69 bpm, LVH, nonspecific inferior st/t changes  Recent Labs: 09/14/2017: B Natriuretic Peptide 578.0 09/15/2017: ALT 9 09/19/2017: BUN 34; Creatinine, Ser 7.93; Hemoglobin 8.9; Platelets 216; Potassium 4.1; Sodium 135  09/19/2017: Cholesterol 159; HDL 53; LDL Cholesterol 82; Total CHOL/HDL Ratio 3.0; Triglycerides 118; VLDL 24   Estimated Creatinine Clearance: 5.2 mL/min (A) (by C-G formula based on SCr of 7.93 mg/dL (H)).   Wt Readings from Last 3 Encounters:  09/20/17 141 lb 4 oz (64.1 kg)  09/19/17 138 lb 12.8 oz (63 kg)  09/15/17 139 lb (63 kg)     Other studies reviewed: Additional studies/records reviewed today include: summarized above  ASSESSMENT AND PLAN:  1. Chest pain with moderate risk for cardiac etiology: Currently without pain. Pain has been substernal and does not radiate. Deep inspiration will improve the pain. Pain has been intermittent and not associated with exertion. Pain will last for ~ 30-45 minutes before improving with deep breathing. She has not taken any medication for this pain. Palpation  of the anterior chest wall does cause some discomfort, though this is a different pain than the pain she has been dealing with. Schedule Lexiscan Myoview to evaluate for high-risk ischemia. Continue ASA and Coreg.   2. Chronic diastolic CHF: She does not appear volume overloaded at this time. Volume is mostly managed through HD, though she does female some urine. Consider prn Lasix pending echo results. Not on spironolactone given CKD.  CHF education provided. Patient does not eat out at restaurants though does apply extra salt to her food daily. She drinks less than liters of fluids daily.   3. Murmur: Check echo. Previously noted to have mild mitral regurgitation.   4. Fall: Patient reports falling down while attempting to push in the reclining function on her recliner in late September, 2018. She is not certain what led to the fall. There was no associated chest pain, palpitations, diaphoresis, dizziness, presyncope or syncope. She is fairly unsteady on her feet at baseline and uses a cane. She declines prescription for a walker today. She declines outpatient cardiac monitoring. Check echo as above.   5. Dyspnea: Improved. Nuclear stress test 08/2015 without evidence of ischemia, though poor image quality due to severe GI uptake artifact. Unable to assess EF or wall motion. Read as low risk study. She underwent CPET 09/2016 that showed poor function status without evidence of ischemia on EKG. Has been advised to start exercise program previously. She is yet to start this exercise regimen. Check echo and nuclear stress test as above.   6. ESRD on HD: Followed by nephrology for HD Tuesday and Saturday. CPET as above showing poor functional status. Followed by [facility] transplant service.   7. HTN: Blood pressure reasonably controlled today. Continue current medications.   8. HLD: Consider changing pravastatin to Lipitor as below. Defer to primary cardiologist.   9. Aortic atherosclerosis: LDL of 82 on  09/19/2017. Consider changing pravastatin to Lipitor. Has previously not tolerated simvastatin 2/2 myalgias.   Disposition: F/u with me in 4 weeks.   Current medicines are reviewed at length with the patient today.  The patient did not have any concerns regarding medicines.  Melvern Banker PA-C 09/20/2017 2:21 PM     [address] [place], [place] [address] [phone]

## 2017-09-20 NOTE — Patient Instructions (Addendum)
Medication Instructions:  Your physician recommends that you continue on your current medications as directed. Please refer to the Current Medication list given to you today.   Labwork: none  Testing/Procedures: Your physician has requested that you have an echocardiogram. Echocardiography is a painless test that uses sound waves to create images of your heart. It provides your doctor with information about the size and shape of your heart and how well your heart's chambers and valves are working. This procedure takes approximately one hour. There are no restrictions for this procedure.  Your physician has requested that you have a lexiscan myoview. For further information please visit [email or web address]. Please follow instruction sheet, as given.  Phyllis [name]  Your caregiver has ordered a Stress Test with nuclear imaging. The purpose of this test is to evaluate the blood supply to your heart muscle. This procedure is referred to as a "Non-Invasive Stress Test." This is because other than having an IV started in your vein, nothing is inserted or "invades" your body. Cardiac stress tests are done to find areas of poor blood flow to the heart by determining the extent of coronary artery disease (CAD). Some patients exercise on a treadmill, which naturally increases the blood flow to your heart, while others who are  unable to walk on a treadmill due to physical limitations have a pharmacologic/chemical stress agent called Lexiscan . This medicine will mimic walking on a treadmill by temporarily increasing your coronary blood flow.   Please note: these test may take anywhere between 2-4 hours to complete  PLEASE REPORT TO [place] AT THE FIRST DESK WILL DIRECT YOU WHERE TO GO  Date of Procedure:___11/5/18_______  Arrival Time for Procedure:______08:45 AM______  Instructions regarding medication:   _X_:  Hold betablocker(CARVEDILOL) THE night before  procedure and morning of procedure   PLEASE NOTIFY THE OFFICE AT LEAST 24 HOURS IN ADVANCE IF YOU ARE UNABLE TO KEEP YOUR APPOINTMENT.  [phone] AND  PLEASE NOTIFY NUCLEAR MEDICINE AT [facility] AT LEAST 24 HOURS IN ADVANCE IF YOU ARE UNABLE TO KEEP YOUR APPOINTMENT. [phone]  How to prepare for your Myoview test:  1. Do not eat or drink after midnight 2. No caffeine for 24 hours prior to test 3. No smoking 24 hours prior to test. 4. Your medication may be taken with water.  If your doctor stopped a medication because of this test, do not take that medication. 5. Ladies, please do not wear dresses.  Skirts or pants are appropriate. Please wear a short sleeve shirt. 6. No perfume, cologne or lotion. 7. Wear comfortable walking shoes. No heels!    Follow-Up: Your physician recommends that you schedule a follow-up appointment in: [place] APP.   If you need a refill on your cardiac medications before your next appointment, please call your pharmacy.   Cardiac Nuclear Scan A cardiac nuclear scan is a test that measures blood flow to the heart when a person is resting and when he or she is exercising. The test looks for problems such as:  Not enough blood reaching a portion of the heart.  The heart muscle not working normally.  You may need this test if:  You have heart disease.  You have had abnormal lab results.  You have had heart surgery or angioplasty.  You have chest pain.  You have shortness of breath.  In this test, a radioactive dye (tracer) is injected into your bloodstream. After the tracer has  traveled to your heart, an imaging device is used to measure how much of the tracer is absorbed by or distributed to various areas of your heart. This procedure is usually done at a hospital and takes 2-4 hours. Tell a health care provider about:  Any allergies you have.  All medicines you are taking, including vitamins, herbs, eye drops, creams, and  over-the-counter medicines.  Any problems you or family members have had with the use of anesthetic medicines.  Any blood disorders you have.  Any surgeries you have had.  Any medical conditions you have.  Whether you are pregnant or may be pregnant. What are the risks? Generally, this is a safe procedure. However, problems may occur, including:  Serious chest pain and heart attack. This is only a risk if the stress portion of the test is done.  Rapid heartbeat.  Sensation of warmth in your chest. This usually passes quickly.  What happens before the procedure?  Ask your health care provider about changing or stopping your regular medicines. This is especially important if you are taking diabetes medicines or blood thinners.  Remove your jewelry on the day of the procedure. What happens during the procedure?  An IV tube will be inserted into one of your veins.  Your health care provider will inject a small amount of radioactive tracer through the tube.  You will wait for 20-40 minutes while the tracer travels through your bloodstream.  Your heart activity will be monitored with an electrocardiogram (ECG).  You will lie down on an exam table.  Images of your heart will be taken for about 15-20 minutes.  You may be asked to exercise on a treadmill or stationary bike. While you exercise, your heart's activity will be monitored with an ECG, and your blood pressure will be checked. If you are unable to exercise, you may be given a medicine to increase blood flow to parts of your heart.  When blood flow to your heart has peaked, a tracer will again be injected through the IV tube.  After 20-40 minutes, you will get back on the exam table and have more images taken of your heart.  When the procedure is over, your IV tube will be removed. The procedure may vary among health care providers and hospitals. Depending on the type of tracer used, scans may need to be repeated 3-4  hours later. What happens after the procedure?  Unless your health care provider tells you otherwise, you may return to your normal schedule, including diet, activities, and medicines.  Unless your health care provider tells you otherwise, you may increase your fluid intake. This will help flush the contrast dye from your body. Drink enough fluid to keep your urine clear or pale yellow.  It is up to you to get your test results. Ask your health care provider, or the department that is doing the test, when your results will be ready. Summary  A cardiac nuclear scan measures the blood flow to the heart when a person is resting and when he or she is exercising.  You may need this test if you are at risk for heart disease.  Tell your health care provider if you are pregnant.  Unless your health care provider tells you otherwise, increase your fluid intake. This will help flush the contrast dye from your body. Drink enough fluid to keep your urine clear or pale yellow. This information is not intended to replace advice given to you by your health care  provider. Make sure you discuss any questions you have with your health care provider. Document Released: 12/01/2004 Document Revised: 11/08/2016 Document Reviewed: 10/15/2013 Elsevier Interactive Patient Education  2017 [place].     Echocardiogram An echocardiogram, or echocardiography, uses sound waves (ultrasound) to produce an image of your heart. The echocardiogram is simple, painless, obtained within a short period of time, and offers valuable information to your health care provider. The images from an echocardiogram can provide information such as:  Evidence of coronary artery disease (CAD).  Heart size.  Heart muscle function.  Heart valve function.  Aneurysm detection.  Evidence of a past heart attack.  Fluid buildup around the heart.  Heart muscle thickening.  Assess heart valve function.  Tell a health care  provider about:  Any allergies you have.  All medicines you are taking, including vitamins, herbs, eye drops, creams, and over-the-counter medicines.  Any problems you or family members have had with anesthetic medicines.  Any blood disorders you have.  Any surgeries you have had.  Any medical conditions you have.  Whether you are pregnant or may be pregnant. What happens before the procedure? No special preparation is needed. Eat and drink normally. What happens during the procedure?  In order to produce an image of your heart, gel will be applied to your chest and a wand-like tool (transducer) will be moved over your chest. The gel will help transmit the sound waves from the transducer. The sound waves will harmlessly bounce off your heart to allow the heart images to be captured in real-time motion. These images will then be recorded.  You may need an IV to receive a medicine that improves the quality of the pictures. What happens after the procedure? You may return to your normal schedule including diet, activities, and medicines, unless your health care provider tells you otherwise. This information is not intended to replace advice given to you by your health care provider. Make sure you discuss any questions you have with your health care provider. Document Released: 11/03/2000 Document Revised: 06/24/2016 Document Reviewed: 07/14/2013 Elsevier Interactive Patient Education  2017 [employer].

## 2017-09-21 ENCOUNTER — Emergency Department
Admission: EM | Admit: 2017-09-21 | Discharge: 2017-09-21 | Disposition: A | Discharge status: Home / Self Care | Payer: Medicare Other | Attending: Emergency Medicine | Admitting: Emergency Medicine

## 2017-09-21 ENCOUNTER — Telehealth: Payer: Self-pay | Admitting: Cardiovascular Disease

## 2017-09-21 ENCOUNTER — Emergency Department: Payer: Medicare Other

## 2017-09-21 ENCOUNTER — Other Ambulatory Visit: Payer: Self-pay

## 2017-09-21 DIAGNOSIS — Z7982 Long term (current) use of aspirin: Secondary | ICD-10-CM | POA: Insufficient documentation

## 2017-09-21 DIAGNOSIS — Z885 Allergy status to narcotic agent status: Secondary | ICD-10-CM | POA: Diagnosis not present

## 2017-09-21 DIAGNOSIS — J45909 Unspecified asthma, uncomplicated: Secondary | ICD-10-CM | POA: Insufficient documentation

## 2017-09-21 DIAGNOSIS — J449 Chronic obstructive pulmonary disease, unspecified: Secondary | ICD-10-CM | POA: Diagnosis not present

## 2017-09-21 DIAGNOSIS — E1122 Type 2 diabetes mellitus with diabetic chronic kidney disease: Secondary | ICD-10-CM | POA: Insufficient documentation

## 2017-09-21 DIAGNOSIS — I5032 Chronic diastolic (congestive) heart failure: Secondary | ICD-10-CM | POA: Insufficient documentation

## 2017-09-21 DIAGNOSIS — I132 Hypertensive heart and chronic kidney disease with heart failure and with stage 5 chronic kidney disease, or end stage renal disease: Secondary | ICD-10-CM | POA: Diagnosis not present

## 2017-09-21 DIAGNOSIS — Z79899 Other long term (current) drug therapy: Secondary | ICD-10-CM | POA: Diagnosis not present

## 2017-09-21 DIAGNOSIS — I700 Atherosclerosis of aorta: Secondary | ICD-10-CM | POA: Diagnosis not present

## 2017-09-21 DIAGNOSIS — Z992 Dependence on renal dialysis: Secondary | ICD-10-CM | POA: Diagnosis not present

## 2017-09-21 DIAGNOSIS — R0789 Other chest pain: Secondary | ICD-10-CM | POA: Insufficient documentation

## 2017-09-21 DIAGNOSIS — N186 End stage renal disease: Secondary | ICD-10-CM | POA: Diagnosis not present

## 2017-09-21 LAB — CBC WITH DIFFERENTIAL/PLATELET
Basophils Absolute: 0.1 10*3/uL (ref 0–0.1)
Basophils Relative: 1 %
Eosinophils Absolute: 0.1 10*3/uL (ref 0–0.7)
Eosinophils Relative: 2 %
HCT: 28 % — ABNORMAL LOW (ref 35.0–47.0)
Hemoglobin: 9.4 g/dL — ABNORMAL LOW (ref 12.0–16.0)
Lymphocytes Relative: 16 %
Lymphs Abs: 1.2 10*3/uL (ref 1.0–3.6)
MCH: 29.6 pg (ref 26.0–34.0)
MCHC: 33.5 g/dL (ref 32.0–36.0)
MCV: 88.2 fL (ref 80.0–100.0)
Monocytes Absolute: 0.8 10*3/uL (ref 0.2–0.9)
Monocytes Relative: 10 %
Neutro Abs: 5.5 10*3/uL (ref 1.4–6.5)
Neutrophils Relative %: 71 %
Platelets: 229 10*3/uL (ref 150–440)
RBC: 3.18 MIL/uL — ABNORMAL LOW (ref 3.80–5.20)
RDW: 14.6 % — ABNORMAL HIGH (ref 11.5–14.5)
WBC: 7.6 10*3/uL (ref 3.6–11.0)

## 2017-09-21 LAB — BASIC METABOLIC PANEL
Anion gap: 12 (ref 5–15)
BUN: 33 mg/dL — ABNORMAL HIGH (ref 6–20)
CO2: 26 mmol/L (ref 22–32)
Calcium: 8 mg/dL — ABNORMAL LOW (ref 8.9–10.3)
Chloride: 97 mmol/L — ABNORMAL LOW (ref 101–111)
Creatinine, Ser: 6.87 mg/dL — ABNORMAL HIGH (ref 0.44–1.00)
GFR calc Af Amer: 6 mL/min — ABNORMAL LOW (ref >60)
GFR calc non Af Amer: 5 mL/min — ABNORMAL LOW (ref >60)
Glucose, Bld: 107 mg/dL — ABNORMAL HIGH (ref 65–99)
Potassium: 3.5 mmol/L (ref 3.5–5.1)
Sodium: 135 mmol/L (ref 135–145)

## 2017-09-21 LAB — HEPATIC FUNCTION PANEL
ALT: 14 U/L (ref 14–54)
AST: 24 U/L (ref 15–41)
Albumin: 3.7 g/dL (ref 3.5–5.0)
Alkaline Phosphatase: 52 U/L (ref 38–126)
Bilirubin, Direct: <0.1 mg/dL — ABNORMAL LOW (ref 0.1–0.5)
Total Bilirubin: 0.6 mg/dL (ref 0.3–1.2)
Total Protein: 6.3 g/dL — ABNORMAL LOW (ref 6.5–8.1)

## 2017-09-21 LAB — TROPONIN I: Troponin I: 0.03 ng/mL (ref <0.03)

## 2017-09-21 LAB — BRAIN NATRIURETIC PEPTIDE: B Natriuretic Peptide: 451 pg/mL — ABNORMAL HIGH (ref 0.0–100.0)

## 2017-09-21 MED ORDER — OXYCODONE-ACETAMINOPHEN 5-325 MG PO TABS
1.0000 | ORAL_TABLET | Freq: Once | ORAL | Status: AC
  Administered 2017-09-21: 1 via ORAL
  Filled 2017-09-21: qty 1

## 2017-09-21 MED ORDER — NITROGLYCERIN 0.4 MG SL SUBL: 0.4000 mg | SUBLINGUAL_TABLET | SUBLINGUAL | 1 refills | Status: DC | PRN

## 2017-09-21 NOTE — Telephone Encounter (Signed)
Pt c/o of Chest Pain: STAT if CP now or developed within 24 hours  1. Are you having CP right now? yes  2. Are you experiencing any other symptoms (ex. SOB, nausea, vomiting, sweating)?  No just chest hurting  3. How long have you been experiencing CP?  For a couple hours  4. Is your CP continuous or coming and going?  Continuous  5. Have you taken Nitroglycerin?  Doesn't have any   ?

## 2017-09-21 NOTE — Telephone Encounter (Signed)
Pt called back to report she took 3 nitroglycerin tablets. Pain unrelieved so she called 911. She is currently in the ER awaiting labs. Bernerd Pho, [place], aware

## 2017-09-21 NOTE — ED Notes (Signed)
Patient does not appear to be in any acute distress at time of discharge. Patient wheeled to lobby in wheelchair. Patient denies any comments or concerns regarding discharge.  

## 2017-09-21 NOTE — Telephone Encounter (Signed)
Pt c/o 9 out of 10 chest pain beginning at 7am today. It is constant in the center of her chest, sometimes improving with deep inspiration. Denies N/V, left arm or jaw pain, no epigastric or back pain.  Reports being "a little short of breath" BP 169/71, HR 96. She took coreg and amlodipine half hour ago. Pt's friend [name] is with her at this time.  Pt was recently discharged 10/31 [facility] w/diagnosis of chest pain. Follow up visit with Christell Faith, PA, 11/1. Pt scheduled for lexiscan and echo.  Reviewed with Bernerd Pho, PA, who recommends nitroglycerin and continue to monitor sx. Reviewed recommendations w/pt who is agreeable w/plan. Her friend will pick up prescription now. Reviewed how to take nitroglycerin and Ss to monitor. She understands if symptoms do not improve or worsen, she should proceed to ER or call 911.

## 2017-09-21 NOTE — ED Notes (Signed)
Reported top provider patient requested something for pain

## 2017-09-21 NOTE — ED Provider Notes (Addendum)
[facility] Emergency Department Provider Note  ____________________________________________   I have reviewed the triage vital signs and the nursing notes.   HISTORY  Chief Complaint Chest Pain (Since 0700, constant, sub sternal, mid chest, no radiation, no SOB)    HPI Phyllis [name] is a 71 y.o. female  a history of anemia anxiety asthma chronic back pain COPD end-stage renal usually on Tuesday/Saturday dialysis, hypertension, who presents today complaining of right-sided chest wall pain. Patient states she has a history of costochondritis, she takes hydrocodone for it.  She states she has not run out of her hydrocodone.  The pain is sharp, nonpleuritic, worse when she touches it or changes position.  "It is my chest muscles that hurt".  She firmly believes that her costochondritis.  Patient is very anxious and upset about this.  She has had multiple different visits in the last few days similar including a hospital admission, she was seen here on October 23 October 27 October 30th and October 31 and admitted for chest pain, with a reassuring evaluation and discharge.  He also saw her cardiologist for this and they do not think it is a blood clot or ACS.  She has no cough she is not short of breath pain is pain is sometimes is better when she takes a deep breath in fact.   Past Medical History:  Diagnosis Date  . Allergy   . Anemia of chronic disease   . Anxiety   . Aortic atherosclerosis ([place])   . Asthma   . Chronic back pain   . COPD (chronic obstructive pulmonary disease) ([place])   . Diabetes mellitus with complication ([place])   . ESRD on hemodialysis ([place])    a. Tues/Sat; b. 2/2 small kidneys  . Essential hypertension   . Fistula    lower left arm  . GERD (gastroesophageal reflux disease)   . Gout   . History of echocardiogram    a. TTE 01/2014: nl LV sys fxn, no valvular abnormalities; b. TTE 11/16: nl EF, mild LVH  . History of exercise stress  test    a. 01/2014: no evidence of ischemia; b. Lexiscan 08/2015: no sig ischemia, severe GI uptake artifact, low risk; c. CPET @ [facility] 09/2016: exercised 3 min 12 sec on bike without incline, 2.28 METs, VO2 of 8.1, 48% of predicted, indicating mod to sev functional impairment, evidence of blunted HR, stroke volume, and BP augmentation as well as ventilation-perfusion mismatch with exercise  . HLD (hyperlipidemia)   . 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 Sleep apnea     Patient Active Problem List   Diagnosis Date Noted  . Chest pain 09/18/2017  . Chest discomfort 08/06/2015  . Hyperlipidemia 08/06/2015  . Complication from renal dialysis device 04/12/2015  . SOB (shortness of breath) 02/01/2015  . End stage renal disease ([place]) 02/01/2015  . Dependence on hemodialysis ([place]) 02/01/2015  . Type 2 diabetes mellitus with other specified complication ([place]) 96/22/2979  . Asthma 02/01/2015  . Chronic diastolic CHF (congestive heart failure) ([place]) 02/01/2015    Past Surgical History:  Procedure Laterality Date  . carpel tunnel    . GALLBLADDER SURGERY    . PERIPHERAL VASCULAR CATHETERIZATION N/A 04/12/2015   Procedure: A/V Shuntogram/Fistulagram;  Surgeon: Algernon Huxley, MD;  Location: [place] [facility];  Service: Cardiovascular;  Laterality: N/A;  . PERIPHERAL VASCULAR CATHETERIZATION N/A 04/12/2015   Procedure: A/V Shunt Intervention;  Surgeon: Algernon Huxley,  MD;  Location: [place] [facility];  Service: Cardiovascular;  Laterality: N/A;  . PERIPHERAL VASCULAR CATHETERIZATION N/A 06/09/2015   Procedure: Dialysis/Perma Catheter Removal;  Surgeon: Katha Cabal, MD;  Location: [place] [facility];  Service: Cardiovascular;  Laterality: N/A;    Prior to Admission medications   Medication Sig Start Date End Date Taking? Authorizing Provider  albuterol (PROVENTIL HFA;VENTOLIN HFA) 108 (90 Base) MCG/ACT inhaler Inhale 2 puffs into the lungs every 6 (six) hours as  needed for wheezing or shortness of breath.    [provider]  amLODipine (NORVASC) 10 MG tablet Take 10 mg by mouth.  12/29/13   [provider]  aspirin EC 81 MG EC tablet Take 1 tablet (81 mg total) by mouth daily. 09/20/17   Bettey Costa, MD  calcium acetate, Phos Binder, (PHOSLYRA) 667 MG/5ML SOLN Take by mouth 3 (three) times daily with meals.    [provider]  carvedilol (COREG) 12.5 MG tablet Take 12.5 mg by mouth 2 (two) times daily with a meal.  01/09/14   [provider]  cetirizine (ZYRTEC) 10 MG tablet Take 10 mg by mouth as needed for allergies.    [provider]  cyclobenzaprine (FLEXERIL) 10 MG tablet Take 10 mg by mouth 3 (three) times daily as needed for muscle spasms.    [provider]  docusate sodium (COLACE) 100 MG capsule Take 100 mg by mouth 3 (three) times daily as needed for mild constipation.    [provider]  Fluticasone-Salmeterol (ADVAIR) 250-50 MCG/DOSE AEPB Inhale 1 puff into the lungs 2 (two) times daily.    [provider]  HYDROcodone-acetaminophen (NORCO) 5-325 MG tablet Take 1 tablet by mouth every 6 (six) hours as needed for moderate pain or severe pain. 08/16/17   Eula Listen, MD  ipratropium-albuterol (DUONEB) 0.5-2.5 (3) MG/3ML SOLN Inhale into the lungs.    [provider]  Lidocaine HCl 4 % GEL Apply 1 Application topically once daily as needed. 03/26/17 03/26/18  [provider]  mometasone (NASONEX) 50 MCG/ACT nasal spray Place 2 sprays into the nose daily.    [provider]  nitroGLYCERIN (NITROSTAT) 0.4 MG SL tablet Place 1 tablet (0.4 mg total) under the tongue every 5 (five) minutes as needed for chest pain. 09/21/17   Strader, Fransisco Hertz, PA-C  omeprazole (PRILOSEC) 20 MG capsule Take 20 mg by mouth 2 (two) times daily before a meal.    [provider]  ondansetron (ZOFRAN) 4 MG tablet Take 4 mg by mouth every 8 (eight) hours as needed for  nausea or vomiting.    [provider]  pravastatin (PRAVACHOL) 40 MG tablet Take 40 mg by mouth daily.  12/29/13   [provider]  senna (SENOKOT) 8.6 MG tablet Take 1 tablet by mouth daily.    [provider]  topiramate (TOPAMAX) 25 MG tablet Take 25 mg by mouth as needed.  01/14/14   [provider]  VESICARE 5 MG tablet Take 5 mg by mouth daily.  11/11/13   [provider]  Vitamin D, Ergocalciferol, (DRISDOL) 50000 UNITS CAPS capsule Take 50,000 Units by mouth every 7 (seven) days.  12/29/13   [provider]    Allergies Codeine; Enalapril maleate; Nitrofurantoin; Sulfamethoxazole-trimethoprim; Vicodin [hydrocodone-acetaminophen]; 2,4-d dimethylamine (amisol); Baclofen; Neosporin [neomycin-bacitracin zn-polymyx]; Quinine; Ultram [tramadol]; Zocor [simvastatin]; Bactrim [sulfamethoxazole-trimethoprim]; Levodopa; Macrodantin [nitrofurantoin macrocrystal]; Quinine derivatives; and Vasotec [enalapril]  Family History  Problem Relation Age of Onset  . Hypertension Mother   .  Hyperlipidemia Mother   . Heart disease Father   . Heart attack Father 35  . Hypertension Father   . Hyperlipidemia Father   . Heart disease Brother        CABG   . Heart attack Brother   . Breast cancer Neg Hx     Social History Social History  Substance Use Topics  . Smoking status: Never Smoker  . Smokeless tobacco: Never Used  . Alcohol use No    Review of Systems Constitutional: No fever/chills Eyes: No visual changes. ENT: No sore throat. No stiff neck no neck pain Cardiovascular: + chest pain. Respiratory: Denies shortness of breath. Gastrointestinal:   no vomiting.  No diarrhea.  No constipation. Genitourinary: Negative for dysuria. Musculoskeletal: Negative lower extremity swelling Skin: Negative for rash. Neurological: Negative for severe headaches, focal weakness or numbness.   ____________________________________________   PHYSICAL  EXAM:  VITAL SIGNS: ED Triage Vitals  Enc Vitals Group     BP 09/21/17 1538 (!) 136/58     Pulse Rate 09/21/17 1538 77     Resp 09/21/17 1538 18     Temp 09/21/17 1538 98.8 F (37.1 C)     Temp Source 09/21/17 1538 Oral     SpO2 09/21/17 1538 95 %     Weight 09/21/17 1539 141 lb (64 kg)     Height 09/21/17 1539 4\' 10"  (1.473 m)     Head Circumference --      Peak Flow --      Pain Score 09/21/17 1536 9     Pain Loc --      Pain Edu? --      Excl. in [place]? --     Constitutional: Alert and oriented. Well appearing and in no acute distress. Eyes: Conjunctivae are normal Head: Atraumatic HEENT: No congestion/rhinnorhea. Mucous membranes are moist.  Oropharynx non-erythematous Neck:   Nontender with no meningismus, no masses, no stridor Cardiovascular: Normal rate, regular rhythm.  Chronic 2/6 systolic murmur noted good peripheral circulation. Chest: Tender to palpation the right chest wall no crepitus no flail chest by touch this area patient states "ouch that is the pain right there" and post back.  Also elicitable with motion or ranging her arm.  T there are no lesions noted Respiratory: Normal respiratory effort.  No retractions. Lungs CTAB. Abdominal: Soft and nontender. No distention. No guarding no rebound Back:  There is no focal tenderness or step off.  there is no midline tenderness there are no lesions noted. there is no CVA tenderness  Musculoskeletal: No lower extremity tenderness, no upper extremity tenderness. No joint effusions, no DVT signs strong distal pulses no edema Neurologic:  Normal speech and language. No gross focal neurologic deficits are appreciated.  Skin:  Skin is warm, dry and intact. No rash noted. Psychiatric: Mood and affect are anxious. Speech and behavior are normal.  ____________________________________________   LABS (all labs ordered are listed, but only abnormal results are displayed)  Labs Reviewed  TROPONIN I - Abnormal; Notable for the  following:       Result Value   Troponin I 0.03 (*)    All other components within normal limits  CBC WITH DIFFERENTIAL/PLATELET - Abnormal; Notable for the following:    RBC 3.18 (*)    Hemoglobin 9.4 (*)    HCT 28.0 (*)    RDW 14.6 (*)    All other components within normal limits  BASIC METABOLIC PANEL - Abnormal; Notable for the following:  Chloride 97 (*)    Glucose, Bld 107 (*)    BUN 33 (*)    Creatinine, Ser 6.87 (*)    Calcium 8.0 (*)    GFR calc non Af Amer 5 (*)    GFR calc Af Amer 6 (*)    All other components within normal limits  HEPATIC FUNCTION PANEL - Abnormal; Notable for the following:    Total Protein 6.3 (*)    Bilirubin, Direct <0.1 (*)    All other components within normal limits  BRAIN NATRIURETIC PEPTIDE - Abnormal; Notable for the following:    B Natriuretic Peptide 451.0 (*)    All other components within normal limits    Pertinent labs  results that were available during my care of the patient were reviewed by me and considered in my medical decision making (see chart for details). ____________________________________________  EKG  I personally interpreted any EKGs ordered by me or triage Normal sinus rhythm, rate 78 bpm, no acute ST elevation or depression, normal axis, nonspecific ST changes borderline long QT ____________________________________________  RADIOLOGY  Pertinent labs & imaging results that were available during my care of the patient were reviewed by me and considered in my medical decision making (see chart for details). If possible, patient and/or family made aware of any abnormal findings. ____________________________________________    PROCEDURES  Procedure(s) performed: None  Procedures  Critical Care performed: None  ____________________________________________   INITIAL IMPRESSION / ASSESSMENT AND PLAN / ED COURSE  Pertinent labs & imaging results that were available during my care of the patient were reviewed  by me and considered in my medical decision making (see chart for details).  Patient here with very reproducible chest wall pain which is been chronic for 1 week.  She has chronic costochondritis.  She feels this is her costochondritis and frankly I do to.  At this time at this time, there does not appear to be clinical evidence to support the diagnosis of pulmonary embolus, dissection, myocarditis, endocarditis, pericarditis, pericardial tamponade, acute coronary syndrome, pneumothorax, pneumonia, or any other acute intrathoracic pathology that will require admission or acute intervention. Nor is there evidence of any significant intra-abdominal pathology causing this discomfort.  We will give the patient pain medication, and we will strongly advise close outpatient follow-up. Minute the patient has had uninterrupted pain for 1 week in fact, we will avoid serial enzymes as I do not think they are of utility.  BNP is trending down patient is stable for dialysis tomorrow, creatinine flex ESRD, K+ level is normal.  Patient is somewhat anxious but has no evidence of thoughts of self-harm and with family.  Patient instructed not to drive after getting pain medications here she is here with a ride she states    ____________________________________________   FINAL CLINICAL IMPRESSION(S) / ED DIAGNOSES  Final diagnoses:  None      This chart was dictated using voice recognition software.  Despite best efforts to proofread,  errors can occur which can change meaning.      Schuyler Amor, MD 09/21/17 Joan Flores, MD 09/21/17 1800    Schuyler Amor, MD 09/21/17 Kathyrn Drown

## 2017-09-24 ENCOUNTER — Ambulatory Visit: Payer: Medicare Other | Admitting: Physical Therapy

## 2017-09-24 ENCOUNTER — Encounter
Admission: RE | Admit: 2017-09-24 | Discharge: 2017-09-24 | Disposition: A | Discharge status: Home / Self Care | Payer: Medicare Other | Source: Ambulatory Visit | Attending: Physician Assistant | Admitting: Physician Assistant

## 2017-09-24 DIAGNOSIS — R011 Cardiac murmur, unspecified: Secondary | ICD-10-CM

## 2017-09-24 DIAGNOSIS — R079 Chest pain, unspecified: Secondary | ICD-10-CM

## 2017-09-24 HISTORY — DX: Disorder of kidney and ureter, unspecified: N28.9

## 2017-09-24 MED ORDER — TECHNETIUM TC 99M TETROFOSMIN IV KIT
27.2700 | PACK | Freq: Once | INTRAVENOUS | Status: AC | PRN
  Administered 2017-09-24: 27.27 via INTRAVENOUS

## 2017-09-24 MED ORDER — REGADENOSON 0.4 MG/5ML IV SOLN
0.4000 mg | Freq: Once | INTRAVENOUS | Status: AC
  Administered 2017-09-24: 0.4 mg via INTRAVENOUS
  Filled 2017-09-24: qty 5

## 2017-09-24 MED ORDER — TECHNETIUM TC 99M TETROFOSMIN IV KIT
13.0000 | PACK | Freq: Once | INTRAVENOUS | Status: AC | PRN
  Administered 2017-09-24: 9.573 via INTRAVENOUS

## 2017-09-25 LAB — NM MYOCAR MULTI W/SPECT W/WALL MOTION / EF
Estimated workload: 1 METS
LV dias vol: 72 mL (ref 46–106)
LV sys vol: 35 mL
Peak HR: 104 {beats}/min
Percent HR: 73 %
Percent of predicted max HR: 69 %
Rest HR: 81 {beats}/min
SDS: 3
SRS: 1
SSS: 4
Stage 1 Grade: 0 %
Stage 1 HR: 83 {beats}/min
Stage 1 Speed: 0 mph
Stage 2 Grade: 0 %
Stage 2 HR: 83 {beats}/min
Stage 2 Speed: 0 mph
Stage 3 Grade: 0 %
Stage 3 HR: 104 {beats}/min
Stage 3 Speed: 0 mph
Stage 4 Grade: 0 %
Stage 4 HR: 109 {beats}/min
Stage 4 Speed: 0 mph
Stage 5 DBP: 54 mmHg
Stage 5 Grade: 0 %
Stage 5 HR: 107 {beats}/min
Stage 5 SBP: 167 mmHg
Stage 5 Speed: 0 mph
TID: 1.26

## 2017-10-03 ENCOUNTER — Other Ambulatory Visit: Payer: Medicare Other

## 2017-10-03 ENCOUNTER — Encounter: Payer: PRIVATE HEALTH INSURANCE | Admitting: Physical Therapy

## 2017-10-07 ENCOUNTER — Encounter: Payer: Self-pay | Admitting: Emergency Medicine

## 2017-10-07 ENCOUNTER — Emergency Department
Admission: EM | Admit: 2017-10-07 | Discharge: 2017-10-07 | Disposition: A | Discharge status: Home / Self Care | Payer: Medicare Other | Attending: Emergency Medicine | Admitting: Emergency Medicine

## 2017-10-07 ENCOUNTER — Emergency Department: Payer: Medicare Other

## 2017-10-07 ENCOUNTER — Other Ambulatory Visit: Payer: Self-pay

## 2017-10-07 DIAGNOSIS — Z992 Dependence on renal dialysis: Secondary | ICD-10-CM | POA: Diagnosis not present

## 2017-10-07 DIAGNOSIS — R51 Headache: Secondary | ICD-10-CM | POA: Insufficient documentation

## 2017-10-07 DIAGNOSIS — Z7982 Long term (current) use of aspirin: Secondary | ICD-10-CM | POA: Insufficient documentation

## 2017-10-07 DIAGNOSIS — I132 Hypertensive heart and chronic kidney disease with heart failure and with stage 5 chronic kidney disease, or end stage renal disease: Secondary | ICD-10-CM | POA: Diagnosis not present

## 2017-10-07 DIAGNOSIS — F419 Anxiety disorder, unspecified: Secondary | ICD-10-CM | POA: Diagnosis not present

## 2017-10-07 DIAGNOSIS — J45909 Unspecified asthma, uncomplicated: Secondary | ICD-10-CM | POA: Insufficient documentation

## 2017-10-07 DIAGNOSIS — N186 End stage renal disease: Secondary | ICD-10-CM | POA: Diagnosis not present

## 2017-10-07 DIAGNOSIS — I5032 Chronic diastolic (congestive) heart failure: Secondary | ICD-10-CM | POA: Diagnosis not present

## 2017-10-07 DIAGNOSIS — E1122 Type 2 diabetes mellitus with diabetic chronic kidney disease: Secondary | ICD-10-CM | POA: Diagnosis not present

## 2017-10-07 DIAGNOSIS — R519 Headache, unspecified: Secondary | ICD-10-CM

## 2017-10-07 DIAGNOSIS — Z79899 Other long term (current) drug therapy: Secondary | ICD-10-CM | POA: Diagnosis not present

## 2017-10-07 DIAGNOSIS — J449 Chronic obstructive pulmonary disease, unspecified: Secondary | ICD-10-CM | POA: Insufficient documentation

## 2017-10-07 LAB — CBC
HCT: 34.9 % — ABNORMAL LOW (ref 35.0–47.0)
Hemoglobin: 11.3 g/dL — ABNORMAL LOW (ref 12.0–16.0)
MCH: 29 pg (ref 26.0–34.0)
MCHC: 32.4 g/dL (ref 32.0–36.0)
MCV: 89.5 fL (ref 80.0–100.0)
Platelets: 198 10*3/uL (ref 150–440)
RBC: 3.9 MIL/uL (ref 3.80–5.20)
RDW: 15.8 % — ABNORMAL HIGH (ref 11.5–14.5)
WBC: 6.8 10*3/uL (ref 3.6–11.0)

## 2017-10-07 LAB — COMPREHENSIVE METABOLIC PANEL
ALT: 12 U/L — ABNORMAL LOW (ref 14–54)
AST: 19 U/L (ref 15–41)
Albumin: 4.1 g/dL (ref 3.5–5.0)
Alkaline Phosphatase: 65 U/L (ref 38–126)
Anion gap: 15 (ref 5–15)
BUN: 34 mg/dL — ABNORMAL HIGH (ref 6–20)
CO2: 20 mmol/L — ABNORMAL LOW (ref 22–32)
Calcium: 8.7 mg/dL — ABNORMAL LOW (ref 8.9–10.3)
Chloride: 103 mmol/L (ref 101–111)
Creatinine, Ser: 6.42 mg/dL — ABNORMAL HIGH (ref 0.44–1.00)
GFR calc Af Amer: 7 mL/min — ABNORMAL LOW (ref >60)
GFR calc non Af Amer: 6 mL/min — ABNORMAL LOW (ref >60)
Glucose, Bld: 95 mg/dL (ref 65–99)
Potassium: 4.1 mmol/L (ref 3.5–5.1)
Sodium: 138 mmol/L (ref 135–145)
Total Bilirubin: 0.9 mg/dL (ref 0.3–1.2)
Total Protein: 6.8 g/dL (ref 6.5–8.1)

## 2017-10-07 LAB — LIPASE, BLOOD: Lipase: 17 U/L (ref 11–51)

## 2017-10-07 MED ORDER — MORPHINE SULFATE (PF) 2 MG/ML IV SOLN
2.0000 mg | Freq: Once | INTRAVENOUS | Status: AC
Start: 2017-10-07 — End: 2017-10-07
  Administered 2017-10-07: 2 mg via INTRAVENOUS
  Filled 2017-10-07: qty 1

## 2017-10-07 MED ORDER — AMLODIPINE BESYLATE 5 MG PO TABS
10.0000 mg | ORAL_TABLET | Freq: Once | ORAL | Status: AC
Start: 2017-10-07 — End: 2017-10-07
  Administered 2017-10-07: 10 mg via ORAL
  Filled 2017-10-07: qty 2

## 2017-10-07 MED ORDER — ONDANSETRON 4 MG PO TBDP
4.0000 mg | ORAL_TABLET | Freq: Once | ORAL | Status: AC
  Administered 2017-10-07: 4 mg via ORAL
  Filled 2017-10-07: qty 1

## 2017-10-07 MED ORDER — CARVEDILOL 6.25 MG PO TABS
12.5000 mg | ORAL_TABLET | Freq: Once | ORAL | Status: AC
  Administered 2017-10-07: 12.5 mg via ORAL
  Filled 2017-10-07: qty 2

## 2017-10-07 MED ORDER — HYDROCODONE-ACETAMINOPHEN 5-325 MG PO TABS
1.0000 | ORAL_TABLET | Freq: Once | ORAL | Status: AC
  Administered 2017-10-07: 1 via ORAL
  Filled 2017-10-07: qty 1

## 2017-10-07 NOTE — Discharge Instructions (Signed)
You have been seen in the Emergency Department (ED) for a headache.  Please use Tylenol or Motrin as needed for symptoms, but only as written on the box.  As we have discussed, please follow up with your primary care doctor as soon as possible regarding today?s Emergency Department (ED) visit and your headache symptoms.  It is possible that sure new medication, BuSpar may be leading to your headache.  Please discontinue this and discuss this with your primary doctor.  Call your doctor or return to the ED if you have a worsening headache, sudden and severe headache, confusion, slurred speech, facial droop, weakness or numbness in any arm or leg, extreme fatigue, vision problems, or other symptoms that concern you.

## 2017-10-07 NOTE — ED Provider Notes (Signed)
[facility] Emergency Department Provider Note   ____________________________________________   First MD Initiated Contact with Patient 10/07/17 [phone]     (approximate)  I have reviewed the triage vital signs and the nursing notes.   HISTORY  Chief Complaint Headache and Emesis    HPI Phyllis [name] is a 71 y.o. female comes for evaluation of a headache  Patient reports that she had a headache on Tuesday at dialysis, it was throbbing and mostly right-sided.  It went away after a day or 2, then yesterday her headache came back.  She reports that she started a new medication, BuSpar, to help with anxiety related to her medical conditions and dialysis.  She first took the [place] before her dialysis Tuesday, and reports that the headache started about 1-2 hours after that and lasted for about 12 hours.  The headache did go away, and then it came back yesterday about an hour to 2 hours after she took a second tablet of BuSpar in preparation for having dialysis yesterday.  She reports however that she became nauseated felt slightly dizzy with it, and threw up once so she did not go to dialysis.  She presently reports that she has a moderate to severe right sided throbbing headache.  No changes in her vision.  No numbness or tingling.  No trouble speaking.  Reports that seems to be getting slightly better now than it was yesterday, but has been somewhat persistent which led her to come for evaluation today.  No chest pain.  No trouble breathing.  Sees a nephrologist regularly.  No fevers or chills.  No stiff neck.  No abdominal pain.  Past Medical History:  Diagnosis Date  . Allergy   . Anemia of chronic disease   . Anxiety   . Aortic atherosclerosis ([place])   . Asthma   . Chronic back pain   . COPD (chronic obstructive pulmonary disease) ([place])   . Diabetes mellitus with complication ([place])   . ESRD on hemodialysis ([place])    a. Tues/Sat; b. 2/2 small kidneys   . Essential hypertension   . Fistula    lower left arm  . GERD (gastroesophageal reflux disease)   . Gout   . History of echocardiogram    a. TTE 01/2014: nl LV sys fxn, no valvular abnormalities; b. TTE 11/16: nl EF, mild LVH  . History of exercise stress test    a. 01/2014: no evidence of ischemia; b. Lexiscan 08/2015: no sig ischemia, severe GI uptake artifact, low risk; c. CPET @ [facility] 09/2016: exercised 3 min 12 sec on bike without incline, 2.28 METs, VO2 of 8.1, 48% of predicted, indicating mod to sev functional impairment, evidence of blunted HR, stroke volume, and BP augmentation as well as ventilation-perfusion mismatch with exercise  . HLD (hyperlipidemia)   . 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 Renal insufficiency 09/24/2017   Dialysis patient.  . Sleep apnea     Patient Active Problem List   Diagnosis Date Noted  . Chest pain 09/18/2017  . Chest discomfort 08/06/2015  . Hyperlipidemia 08/06/2015  . Complication from renal dialysis device 04/12/2015  . SOB (shortness of breath) 02/01/2015  . End stage renal disease ([place]) 02/01/2015  . Dependence on hemodialysis ([employer]) 02/01/2015  . Type 2 diabetes mellitus with other specified complication ([place]) 70/11/7492  . Asthma 02/01/2015  . Chronic diastolic CHF (congestive heart failure) ([place]) 02/01/2015    Past Surgical History:  Procedure Laterality Date  . A/V Shunt Intervention N/A 04/12/2015   Performed by Algernon Huxley, MD at [place] [facility]  . A/V Shuntogram/Fistulagram N/A 04/12/2015   Performed by Algernon Huxley, MD at [place] [facility]  . carpel tunnel    . Dialysis/Perma Catheter Removal N/A 06/09/2015   Performed by Katha Cabal, MD at [place] [facility]  . GALLBLADDER SURGERY      Prior to Admission medications   Medication Sig Start Date End Date Taking? Authorizing Provider  albuterol (PROVENTIL HFA;VENTOLIN HFA) 108 (90 Base) MCG/ACT inhaler Inhale 2 puffs into the lungs  every 6 (six) hours as needed for wheezing or shortness of breath.    [provider]  amLODipine (NORVASC) 10 MG tablet Take 10 mg by mouth.  12/29/13   [provider]  aspirin EC 81 MG EC tablet Take 1 tablet (81 mg total) by mouth daily. 09/20/17   Bettey Costa, MD  calcium acetate, Phos Binder, (PHOSLYRA) 667 MG/5ML SOLN Take by mouth 3 (three) times daily with meals.    [provider]  carvedilol (COREG) 12.5 MG tablet Take 12.5 mg by mouth 2 (two) times daily with a meal.  01/09/14   [provider]  cetirizine (ZYRTEC) 10 MG tablet Take 10 mg by mouth as needed for allergies.    [provider]  cyclobenzaprine (FLEXERIL) 10 MG tablet Take 10 mg by mouth 3 (three) times daily as needed for muscle spasms.    [provider]  docusate sodium (COLACE) 100 MG capsule Take 100 mg by mouth 3 (three) times daily as needed for mild constipation.    [provider]  Fluticasone-Salmeterol (ADVAIR) 250-50 MCG/DOSE AEPB Inhale 1 puff into the lungs 2 (two) times daily.    [provider]  HYDROcodone-acetaminophen (NORCO) 5-325 MG tablet Take 1 tablet by mouth every 6 (six) hours as needed for moderate pain or severe pain. 08/16/17   Eula Listen, MD  ipratropium-albuterol (DUONEB) 0.5-2.5 (3) MG/3ML SOLN Inhale into the lungs.    [provider]  Lidocaine HCl 4 % GEL Apply 1 Application topically once daily as needed. 03/26/17 03/26/18  [provider]  mometasone (NASONEX) 50 MCG/ACT nasal spray Place 2 sprays into the nose daily.    [provider]  nitroGLYCERIN (NITROSTAT) 0.4 MG SL tablet Place 1 tablet (0.4 mg total) under the tongue every 5 (five) minutes as needed for chest pain. 09/21/17   Strader, Fransisco Hertz, PA-C  omeprazole (PRILOSEC) 20 MG capsule Take 20 mg by mouth 2 (two) times daily before a meal.    [provider]  ondansetron (ZOFRAN) 4 MG tablet Take 4 mg by mouth every 8  (eight) hours as needed for nausea or vomiting.    [provider]  pravastatin (PRAVACHOL) 40 MG tablet Take 40 mg by mouth daily.  12/29/13   [provider]  senna (SENOKOT) 8.6 MG tablet Take 1 tablet by mouth daily.    [provider]  topiramate (TOPAMAX) 25 MG tablet Take 25 mg by mouth as needed.  01/14/14   [provider]  VESICARE 5 MG tablet Take 5 mg by mouth daily.  11/11/13   [provider]  Vitamin D, Ergocalciferol, (DRISDOL) 50000 UNITS CAPS capsule Take 50,000 Units by mouth every 7 (seven) days.  12/29/13   [provider]    Allergies Codeine; Enalapril maleate; Nitrofurantoin; Sulfamethoxazole-trimethoprim; Vicodin [hydrocodone-acetaminophen]; 2,4-d dimethylamine (amisol); Baclofen; Neosporin [neomycin-bacitracin zn-polymyx];  Quinine; Ultram [tramadol]; Zocor [simvastatin]; Bactrim [sulfamethoxazole-trimethoprim]; Levodopa; Macrodantin [nitrofurantoin macrocrystal]; Quinine derivatives; and Vasotec [enalapril]  Family History  Problem Relation Age of Onset  . Hypertension Mother   . Hyperlipidemia Mother   . Heart disease Father   . Heart attack Father 109  . Hypertension Father   . Hyperlipidemia Father   . Heart disease Brother        CABG   . Heart attack Brother   . Breast cancer Neg Hx     Social History Social History   Tobacco Use  . Smoking status: Never Smoker  . Smokeless tobacco: Never Used  Substance Use Topics  . Alcohol use: No  . Drug use: No    Review of Systems Constitutional: No fever/chills Eyes: No visual changes. ENT: No sore throat. Cardiovascular: Denies chest pain. Respiratory: Denies shortness of breath. Gastrointestinal: No abdominal pain.   No diarrhea.  No constipation. Genitourinary: Negative for dysuria. Musculoskeletal: Negative for back pain. Skin: Negative for rash. Neurological: Negative for focal weakness or numbness. Has not taken her morning medications  yet   ____________________________________________   PHYSICAL EXAM:  VITAL SIGNS: ED Triage Vitals  Enc Vitals Group     BP 10/07/17 0759 (!) 163/71     Pulse Rate 10/07/17 0759 86     Resp 10/07/17 0759 (!) 22     Temp 10/07/17 0759 98.4 F (36.9 C)     Temp Source 10/07/17 0759 Oral     SpO2 10/07/17 0759 96 %     Weight 10/07/17 0759 140 lb (63.5 kg)     Height 10/07/17 0759 4\' 10"  (1.473 m)     Head Circumference --      Peak Flow --      Pain Score 10/07/17 0758 8     Pain Loc --      Pain Edu? --      Excl. in [place]? --     Constitutional: Alert and oriented. Well appearing and in no acute distress.  Does not appear to be in painful extremis.  Eyes: Conjunctivae are normal. Head: Atraumatic.  No temporal artery tenderness.  No tenderness over the scalp. Nose: No congestion/rhinnorhea. Mouth/Throat: Mucous membranes are moist. Neck: No stridor.   Cardiovascular: Normal rate, regular rhythm. Grossly normal heart sounds.  Good peripheral circulation. Respiratory: Normal respiratory effort.  No retractions. Lungs CTAB. Gastrointestinal: Soft and nontender. No distention. Musculoskeletal: No lower extremity tenderness nor edema. Neurologic:  Normal speech and language. No gross focal neurologic deficits are appreciated.  No pronator drift.  Normal strength in all extremities.  No sensory deficits noted in any extremity or over the face.  No cranial nerve deficits.  Normal cranial nerve exam.  Extraocular movements are normal. Skin:  Skin is warm, dry and intact. No rash noted. Psychiatric: Mood and affect are normal. Speech and behavior are normal.  ____________________________________________   LABS (all labs ordered are listed, but only abnormal results are displayed)  Labs Reviewed  CBC - Abnormal; Notable for the following components:      Result Value   Hemoglobin 11.3 (*)    HCT 34.9 (*)    RDW 15.8 (*)    All other components within normal limits   COMPREHENSIVE METABOLIC PANEL - Abnormal; Notable for the following components:   CO2 20 (*)    BUN 34 (*)    Creatinine, Ser 6.42 (*)    Calcium 8.7 (*)    ALT 12 (*)    GFR calc  non Af Amer 6 (*)    GFR calc Af Amer 7 (*)    All other components within normal limits  LIPASE, BLOOD  URINALYSIS, COMPLETE (UACMP) WITH MICROSCOPIC   ____________________________________________  EKG   ____________________________________________  RADIOLOGY  Ct Head Wo Contrast  Result Date: 10/07/2017 CLINICAL DATA:  Headache EXAM: CT HEAD WITHOUT CONTRAST TECHNIQUE: Contiguous axial images were obtained from the base of the skull through the vertex without intravenous contrast. COMPARISON:  08/16/2017 FINDINGS: Brain: Few small old bilateral basal ganglia and right thalamus lacunar infarcts. Extensive chronic small vessel disease throughout the deep white matter. No acute infarction or hemorrhage. No hydrocephalus. Vascular: No hyperdense vessel or unexpected calcification. Skull: No acute calvarial abnormality. Sinuses/Orbits: Visualized paranasal sinuses and mastoids clear. Orbital soft tissues unremarkable. 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 Chronic small vessel disease. No acute intracranial abnormality. Electronically Signed   By: Rolm Baptise M.D.   On: 10/07/2017 08:47   CT result reviewed by me, no acute infarcts ____________________________________________   PROCEDURES  Procedure(s) performed: None  Procedures  Critical Care performed: No  ____________________________________________   INITIAL IMPRESSION / ASSESSMENT AND PLAN / ED COURSE  Pertinent labs & imaging results that were available during my care of the patient were reviewed by me and considered in my medical decision making (see chart for details).  Patient presents for evaluation of a headache.  She does have elevated blood pressure, but reports headache is been  somewhat associated with use of a new medication BuSpar, and headache is a known side effect.  She appears alert well oriented, no evidence or symptoms of stroke or central neurologic abnormality.  Afebrile, no meningismus, no infectious symptoms.  Headache was not sudden onset, not worst headache of her life, has had a waxing and waning course.  No red flags noted.  I do not believe the patient is in need of acute MRI or MRV at this time based on clinical exam and symptoms.  Possibly related to her elevated blood pressure, but I will give her her normal blood pressure medicine today.  She did miss her dialysis session on Saturday, I discussed this with Dr. Candiss Norse from nephrology and he advises after reviewing labs and symptoms that the patient should follow-up on Tuesday for her regular dialysis session.  ----------------------------------------- 9:59 AM on 10/07/2017 -----------------------------------------  Patient reports feeling improved, resting comfortably at this time.  Appears appropriate for ongoing outpatient management, and I did advise her to stop BuSpar as this may be causing her headache.  Return precautions and treatment recommendations and follow-up discussed with the patient who is agreeable with the plan.       ____________________________________________   FINAL CLINICAL IMPRESSION(S) / ED DIAGNOSES  Final diagnoses:  Acute nonintractable headache, unspecified headache type      NEW MEDICATIONS STARTED DURING THIS VISIT:  This SmartLink is deprecated. Use AVSMEDLIST instead to display the medication list for a patient.   Note:  This document was prepared using Dragon voice recognition software and may include unintentional dictation errors.     Delman Kitten, MD 10/07/17 1000

## 2017-10-07 NOTE — ED Notes (Signed)
'  paper siganture of discharge placed in medical records box

## 2017-10-07 NOTE — ED Triage Notes (Signed)
Pt arrived via EMS from home with reports headache x 24 hours and nausea and vomiting.  Pt reports she was started on Buspirone 10mg  3 weeks ago but states she has only been taking it twice a week as needed. Pt states she took her last dose yesterday. Pt is alert and oriented at this time.  Pt is on HD and goes Tuesday and Saturday only. Pt missed yesterday's treatment due to headache.

## 2017-10-07 NOTE — ED Notes (Signed)
Pt resting in bed in no distress, resp even and unlabored

## 2017-10-10 ENCOUNTER — Encounter: Payer: PRIVATE HEALTH INSURANCE | Admitting: Physical Therapy

## 2017-10-15 ENCOUNTER — Ambulatory Visit: Payer: Medicare Other | Admitting: Physical Therapy

## 2017-10-17 ENCOUNTER — Other Ambulatory Visit: Payer: Medicare Other

## 2017-10-19 ENCOUNTER — Ambulatory Visit: Payer: Medicare Other | Admitting: Cardiovascular Disease

## 2017-10-24 ENCOUNTER — Encounter: Payer: PRIVATE HEALTH INSURANCE | Admitting: Physical Therapy

## 2017-11-01 ENCOUNTER — Other Ambulatory Visit: Payer: Medicare Other

## 2017-11-07 ENCOUNTER — Ambulatory Visit (INDEPENDENT_AMBULATORY_CARE_PROVIDER_SITE_OTHER): Payer: Medicare Other | Admitting: Podiatry

## 2017-11-07 ENCOUNTER — Encounter: Payer: Self-pay | Admitting: Podiatry

## 2017-11-07 ENCOUNTER — Ambulatory Visit (INDEPENDENT_AMBULATORY_CARE_PROVIDER_SITE_OTHER): Payer: Medicare Other

## 2017-11-07 DIAGNOSIS — S90852A Superficial foreign body, left foot, initial encounter: Secondary | ICD-10-CM

## 2017-11-07 NOTE — Progress Notes (Signed)
She states that she stepped on a piece of glass 2 weeks ago and I think is trying to work its way out but is painful.  Objective: Vital signs are stable she is alert and oriented x3.  Pulses are palpable.  Neurologic sensorium is intact.  Deep tendon reflexes are intact.  Small area plantar aspect of the forefoot is mildly red and has a very small less than 1 mm piece of glass sticking out of it.  The area was wiped with Betadine and utilizing a scalpel I was able to pick out a small piece of glass.  She states that the pain was completely resolved.  Assessment: Foreign body retained glass forefoot left.  Plan: Removal foreign body left foot.  Follow-up with me in an as-needed basis.

## 2017-11-19 ENCOUNTER — Telehealth: Payer: Self-pay | Admitting: Cardiovascular Disease

## 2017-11-19 ENCOUNTER — Other Ambulatory Visit: Payer: Medicare Other

## 2017-11-19 NOTE — Telephone Encounter (Signed)
Patient came late for echo and could not come back at 4 pm   R/S appt for after ov with Gollan   Should she see Gollan after echo or keep ov  Please advise

## 2017-11-19 NOTE — Telephone Encounter (Signed)
Pt advised to have myoview and echo then follow up.  11/5 lexi myoivew was low risk.  Would reschedule follow up office visit after echo is complete.   Routed back to [employer].

## 2017-11-19 NOTE — Telephone Encounter (Signed)
lmov to confirm appt changes

## 2017-11-28 ENCOUNTER — Ambulatory Visit: Payer: Medicare Other | Admitting: Cardiovascular Disease

## 2017-12-03 ENCOUNTER — Other Ambulatory Visit: Payer: Medicare Other

## 2017-12-10 NOTE — Telephone Encounter (Signed)
Attempted to contact patient to r/s schedule appt   No ans no vm  Bot home and celll

## 2017-12-11 DIAGNOSIS — I700 Atherosclerosis of aorta: Secondary | ICD-10-CM | POA: Insufficient documentation

## 2017-12-11 NOTE — Progress Notes (Signed)
Cardiology Office Note  Date:  12/12/2017   ID:  JACQUELYNNE [name], DOB October 06, 1946, MRN [id number]  PCP:  [name], MD   Chief Complaint  Patient presents with  . OTHER    ED chest pain pt mentioned that she was unable to have echo due to not being able to make it. Meds reviewed verbally with pt.    HPI:  [name] is a very pleasant 72 year old woman with history of  chronic renal failure, on hemodialysis 2 days per week Tuesday and Saturday,  diffuse aortic atherosclerosis seen on CT scan 2014,  obesity,  hypertension,  hyperlipidemia  Mild mitral valve stenosis who presents for follow-up of her hypertension, atherosclerosis, episodes of chest pain.  On HD two day a week , tues and Sat <3 hours Not on lasix  In the ER 09/2017 H/A and vomiting From anxiety pill, buspar  Previous fall 2018 Had stitiches, 08/16/2017  Frequent emergency room evaluation visits for chest pain Atypical in nature 1 hospital admission had Stress test 09/2017 Showing no significant ischemia Normal wall motion, EF estimated at 52% Low risk scan  Previous stress Myoview done October 2017 showing no ischemia, significant GI uptake artifact   she does do any exercise, is very deconditioned  Previous CT abd in 2014: Diffuse athero in aorta  EKG on today's visit shows normal sinus rhythm with rate 75 bpm, no significant ST or T-wave changes  Other past medical history reviewed  essentially normal echocardiogram and stress test March 2015,  admission to the hospital 12/04/2014 for acute respiratory failure with hypoxia, found to have acute on chronic renal failure, 25 pound weight gain  AV fistula was not working on the left and temporary dialysis catheter was placed and dialysis performed 3 in the hospital.  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  PSH:    Past Surgical History:  Procedure Laterality Date  . carpel tunnel    . GALLBLADDER SURGERY    . PERIPHERAL VASCULAR CATHETERIZATION N/A 04/12/2015   Procedure: A/V Shuntogram/Fistulagram;  Surgeon: Algernon Huxley, MD;  Location: [facility];  Service: Cardiovascular;  Laterality: N/A;  . PERIPHERAL VASCULAR CATHETERIZATION N/A 04/12/2015   Procedure: A/V Shunt Intervention;  Surgeon: Algernon Huxley, MD;  Location: [facility];  Service: Cardiovascular;  Laterality: N/A;  . PERIPHERAL VASCULAR CATHETERIZATION N/A 06/09/2015   Procedure: Dialysis/Perma Catheter Removal;  Surgeon: Katha Cabal, MD;  Location: [facility];  Service: Cardiovascular;  Laterality: N/A;    Current Outpatient Medications  Medication Sig Dispense Refill  . albuterol (PROVENTIL HFA;VENTOLIN HFA) 108 (90 Base) MCG/ACT inhaler Inhale 2 puffs into the lungs every 6 (six) hours as needed for wheezing or shortness of breath.    Marland Kitchen amLODipine (NORVASC) 10 MG tablet Take 10 mg by mouth.     Marland Kitchen aspirin EC 81 MG EC tablet Take 1 tablet (81 mg total) by mouth daily.    . calcium acetate, Phos Binder, (PHOSLYRA) 667 MG/5ML SOLN Take by mouth 3 (  three) times daily with meals.    . carvedilol (COREG) 12.5 MG tablet Take 12.5 mg by mouth 2 (two) times daily with a meal.     . cetirizine (ZYRTEC) 10 MG tablet Take 10 mg by mouth as needed for allergies.    . cyclobenzaprine (FLEXERIL) 10 MG tablet Take 10 mg by mouth 3 (three) times  daily as needed for muscle spasms.    Marland Kitchen docusate sodium (COLACE) 100 MG capsule Take 100 mg by mouth 3 (three) times daily as needed for mild constipation.    . Fluticasone-Salmeterol (ADVAIR) 250-50 MCG/DOSE AEPB Inhale 1 puff into the lungs 2 (two) times daily.    Marland Kitchen HYDROcodone-acetaminophen (NORCO) 5-325 MG tablet Take 1 tablet by mouth every 6 (six) hours as needed for moderate pain or severe pain. 10 tablet 0  . ipratropium-albuterol (DUONEB) 0.5-2.5 (3) MG/3ML SOLN Inhale into the lungs.    . Lidocaine HCl 4 % GEL Apply 1 Application topically once daily as needed.    . mometasone (NASONEX) 50 MCG/ACT nasal spray Place 2 sprays into the nose daily.    . nitroGLYCERIN (NITROSTAT) 0.4 MG SL tablet Place 1 tablet (0.4 mg total) under the tongue every 5 (five) minutes as needed for chest pain. 25 tablet 1  . omeprazole (PRILOSEC) 20 MG capsule Take 20 mg by mouth 2 (two) times daily before a meal.    . ondansetron (ZOFRAN) 4 MG tablet Take 4 mg by mouth every 8 (eight) hours as needed for nausea or vomiting.    . pravastatin (PRAVACHOL) 40 MG tablet Take 40 mg by mouth daily.     Marland Kitchen senna (SENOKOT) 8.6 MG tablet Take 1 tablet by mouth daily.    Marland Kitchen topiramate (TOPAMAX) 25 MG tablet Take 25 mg by mouth as needed.     . VESICARE 5 MG tablet Take 5 mg by mouth daily.     . Vitamin D, Ergocalciferol, (DRISDOL) 50000 UNITS CAPS capsule Take 50,000 Units by mouth every 7 (seven) days.     Marland Kitchen aspirin EC 81 MG tablet Take 1 tablet (81 mg total) by mouth daily. 90 tablet 3   No current facility-administered medications for this visit.      Allergies:   Codeine; Enalapril maleate; Nitrofurantoin; Sulfamethoxazole-trimethoprim; Vicodin [hydrocodone-acetaminophen]; 2,4-d dimethylamine (amisol); Baclofen; Neosporin [neomycin-bacitracin zn-polymyx]; Quinine; Ultram [tramadol]; Zocor [simvastatin]; Bactrim [sulfamethoxazole-trimethoprim]; Levodopa; Macrodantin [nitrofurantoin macrocrystal]; Quinine derivatives;  and Vasotec [enalapril]   Social History:  The patient  reports that  has never smoked. she has never used smokeless tobacco. She reports that she does not drink alcohol or use drugs.   Family History:   family history includes Heart attack in her brother; Heart attack (age of onset: 43) in her father; Heart disease in her brother and father; Hyperlipidemia in her father and mother; Hypertension in her father and mother.    Review of Systems: Review of Systems  Constitutional: Negative.   Respiratory: Negative.   Cardiovascular: Negative.   Gastrointestinal: Negative.   Musculoskeletal: Positive for falls.       Leg weakness, unsteady gait  Neurological: Negative.   Psychiatric/Behavioral: Negative.   All other systems reviewed and are negative.    PHYSICAL EXAM: VS:  BP (!) 150/60 (BP Location: Left Arm, Patient Position: Sitting, Cuff Size: Normal)   Pulse 75   Ht 4\' 11"  (1.499 m)   Wt 134 lb 8 oz (61 kg)   BMI 27.17 kg/m  , BMI Body mass index is 27.17 kg/m. GEN: Well  nourished, well developed, in no acute distress, obese  HEENT: normal  Neck: no JVD, carotid bruits, or masses Cardiac: RRR; no murmurs, rubs, or gallops,no edema  Respiratory:  clear to auscultation bilaterally, normal work of breathing GI: soft, nontender, nondistended, + BS MS: no deformity or atrophy  Skin: warm and dry, no rash Neuro:  Strength and sensation are intact Psych: euthymic mood, full affect    Recent Labs: 09/21/2017: B Natriuretic Peptide 451.0 10/07/2017: ALT 12; BUN 34; Creatinine, Ser 6.42; Hemoglobin 11.3; Platelets 198; Potassium 4.1; Sodium 138    Lipid Panel Lab Results  Component Value Date   CHOL 159 09/19/2017   HDL 53 09/19/2017   LDLCALC 82 09/19/2017   TRIG 118 09/19/2017      Wt Readings from Last 3 Encounters:  12/12/17 134 lb 8 oz (61 kg)  10/07/17 140 lb (63.5 kg)  09/21/17 141 lb (64 kg)       ASSESSMENT AND PLAN:  Chronic diastolic CHF (congestive  heart failure) ([place]) - Plan: EKG 12-Lead Euvolemic, hemodialysis twice a week Recommended if she has excessive ankle swelling that she call our office for Lasix on nondialysis days.   Hyperlipidemia, unspecified hyperlipidemia type - Plan: EKG 12-Lead Cholesterol slightly above goal  on the current lipid regimen. No changes to the medications were made.  SOB (shortness of breath) - Plan: EKG 12-Lead Recommended she start a regular exercise or walking program Recent stress test with no ischemia Walks with a cane, leg deconditioning  End stage renal disease ([place]) - Plan: EKG 12-Lead Has hemodialysis 2 days per week  Managed by Dr. Zollie Scale  Dependence on hemodialysis [facility]) - Plan: EKG 12-Lead Not a candidate for kidney transplant per the patient Denies fluid overload  Type 2 diabetes mellitus with other specified complication, unspecified long term insulin use status ([place]) - Plan: EKG 12-Lead Hemoglobin A1c well controlled Stable  Mitral valve stenosis Mild by echo 09/2015 Will repeat echo.  This was previously ordered November 2018    Total encounter time more than 25 minutes  Greater than 50% was spent in counseling and coordination of care with the patient   Disposition:   F/U  12 months   Orders Placed This Encounter  Procedures  . EKG 12-Lead     Signed, Esmond Plants, M.D., Ph.D. 12/12/2017  [place], [place]

## 2017-12-12 ENCOUNTER — Ambulatory Visit (INDEPENDENT_AMBULATORY_CARE_PROVIDER_SITE_OTHER): Payer: Medicare Other | Admitting: Cardiovascular Disease

## 2017-12-12 ENCOUNTER — Encounter: Payer: Self-pay | Admitting: Cardiovascular Disease

## 2017-12-12 VITALS — BP 150/60 | HR 75 | Ht 59.0 in | Wt 134.5 lb

## 2017-12-12 DIAGNOSIS — E1169 Type 2 diabetes mellitus with other specified complication: Secondary | ICD-10-CM

## 2017-12-12 DIAGNOSIS — Z992 Dependence on renal dialysis: Secondary | ICD-10-CM

## 2017-12-12 DIAGNOSIS — R0602 Shortness of breath: Secondary | ICD-10-CM

## 2017-12-12 DIAGNOSIS — I700 Atherosclerosis of aorta: Secondary | ICD-10-CM | POA: Diagnosis not present

## 2017-12-12 DIAGNOSIS — E782 Mixed hyperlipidemia: Secondary | ICD-10-CM | POA: Diagnosis not present

## 2017-12-12 DIAGNOSIS — N186 End stage renal disease: Secondary | ICD-10-CM

## 2017-12-12 DIAGNOSIS — I5032 Chronic diastolic (congestive) heart failure: Secondary | ICD-10-CM | POA: Diagnosis not present

## 2017-12-12 NOTE — Patient Instructions (Addendum)
Medication Instructions:   Consider start aspirin 81 mg daily  Labwork:  No new labs needed  Testing/Procedures:  No further testing at this time   Follow-Up: It was a pleasure seeing you in the office today. Please call us if you have new issues that need to be addressed before your next appt.  [phone]  Your physician wants you to follow-up in: 12 months.  You will receive a reminder letter in the mail two months in advance. If you don't receive a letter, please call our office to schedule the follow-up appointment.  If you need a refill on your cardiac medications before your next appointment, please call your pharmacy.

## 2017-12-26 ENCOUNTER — Ambulatory Visit: Payer: Medicare Other | Admitting: Podiatry

## 2017-12-27 ENCOUNTER — Ambulatory Visit (INDEPENDENT_AMBULATORY_CARE_PROVIDER_SITE_OTHER): Payer: Medicare Other

## 2017-12-27 ENCOUNTER — Other Ambulatory Visit: Payer: Self-pay

## 2017-12-27 DIAGNOSIS — R011 Cardiac murmur, unspecified: Secondary | ICD-10-CM

## 2017-12-27 DIAGNOSIS — R079 Chest pain, unspecified: Secondary | ICD-10-CM | POA: Diagnosis not present

## 2017-12-27 LAB — ECHOCARDIOGRAM COMPLETE
AO mean calculated velocity dopler: 206 cm/s
AV Area VTI index: 0.65 cm2/m2
AV Area mean vel: 1.05 cm2
AV Mean grad: 20 mmHg
AV Peak grad: 37 mmHg
AV VEL mean LVOT/AV: 0.37
AV area mean vel ind: 0.65 cm2/m2
AV pk vel: 305 cm/s
AV vel: 1.05
Area-P 1/2: 2.82 cm2
E decel time: 187 msec
E/e' ratio: 21.22
FS: 29 % (ref 28–44)
IVS/LV PW RATIO, ED: 0.9
LA ID, A-P, ES: 49 mm
LA diam end sys: 49 mm
LA diam index: 3.04 cm/m2
LA vol A4C: 66.5 ml
LA vol index: 37.9 mL/m2
LA vol: 61.2 mL
LV E/e' medial: 21.22
LV E/e'average: 21.22
LV PW d: 10 mm — AB (ref 0.6–1.1)
LV e' LATERAL: 6.74 cm/s
LVOT MV VTI INDEX: 0.88 cm2/m2
LVOT MV VTI: 1.42
LVOT SV: 69 mL
LVOT VTI: 24.4 cm
LVOT area: 2.84 cm2
LVOT diameter: 19 mm
LVOT peak VTI: 0.37 cm
Lateral S' vel: 17 cm/s
MV Annulus VTI: 48.7 cm
MV Dec: 187
MV M vel: 96.9
MV Peak grad: 8 mmHg
MV pk A vel: 130 m/s
MV pk E vel: 143 m/s
Mean grad: 4 mmHg
P 1/2 time: 101 ms
TAPSE: 27 mm
TDI e' lateral: 6.74
TDI e' medial: 6.2
VTI: 66.3 cm
Valve area index: 0.65
Valve area: 1.05 cm2

## 2017-12-28 ENCOUNTER — Other Ambulatory Visit: Payer: Self-pay

## 2017-12-28 ENCOUNTER — Encounter (INDEPENDENT_AMBULATORY_CARE_PROVIDER_SITE_OTHER): Payer: Medicare Other

## 2017-12-28 ENCOUNTER — Ambulatory Visit (INDEPENDENT_AMBULATORY_CARE_PROVIDER_SITE_OTHER): Payer: Medicare Other | Admitting: Vascular Surgery

## 2017-12-28 DIAGNOSIS — I050 Rheumatic mitral stenosis: Secondary | ICD-10-CM

## 2017-12-28 DIAGNOSIS — I350 Nonrheumatic aortic (valve) stenosis: Secondary | ICD-10-CM

## 2018-01-07 ENCOUNTER — Ambulatory Visit: Payer: Medicare Other | Admitting: Podiatry

## 2018-01-16 ENCOUNTER — Inpatient Hospital Stay
Admission: EM | Admit: 2018-01-16 | Discharge: 2018-01-18 | DRG: 123 | Disposition: A | Discharge status: Home / Self Care | Payer: Medicare Other | Attending: Internal Medicine | Admitting: Internal Medicine

## 2018-01-16 ENCOUNTER — Emergency Department: Payer: Medicare Other

## 2018-01-16 ENCOUNTER — Other Ambulatory Visit: Payer: Self-pay

## 2018-01-16 ENCOUNTER — Encounter: Payer: Self-pay | Admitting: Emergency Medicine

## 2018-01-16 ENCOUNTER — Inpatient Hospital Stay: Payer: Medicare Other

## 2018-01-16 DIAGNOSIS — Z885 Allergy status to narcotic agent status: Secondary | ICD-10-CM | POA: Diagnosis not present

## 2018-01-16 DIAGNOSIS — Z66 Do not resuscitate: Secondary | ICD-10-CM | POA: Diagnosis present

## 2018-01-16 DIAGNOSIS — R29701 NIHSS score 1: Secondary | ICD-10-CM | POA: Diagnosis present

## 2018-01-16 DIAGNOSIS — R402414 Glasgow coma scale score 13-15, 24 hours or more after hospital admission: Secondary | ICD-10-CM | POA: Diagnosis not present

## 2018-01-16 DIAGNOSIS — K219 Gastro-esophageal reflux disease without esophagitis: Secondary | ICD-10-CM | POA: Diagnosis present

## 2018-01-16 DIAGNOSIS — Z888 Allergy status to other drugs, medicaments and biological substances status: Secondary | ICD-10-CM

## 2018-01-16 DIAGNOSIS — E1122 Type 2 diabetes mellitus with diabetic chronic kidney disease: Secondary | ICD-10-CM | POA: Diagnosis present

## 2018-01-16 DIAGNOSIS — I120 Hypertensive chronic kidney disease with stage 5 chronic kidney disease or end stage renal disease: Secondary | ICD-10-CM | POA: Diagnosis present

## 2018-01-16 DIAGNOSIS — Z7951 Long term (current) use of inhaled steroids: Secondary | ICD-10-CM

## 2018-01-16 DIAGNOSIS — N2581 Secondary hyperparathyroidism of renal origin: Secondary | ICD-10-CM | POA: Diagnosis present

## 2018-01-16 DIAGNOSIS — H3412 Central retinal artery occlusion, left eye: Principal | ICD-10-CM | POA: Diagnosis present

## 2018-01-16 DIAGNOSIS — E8770 Fluid overload, unspecified: Secondary | ICD-10-CM | POA: Diagnosis present

## 2018-01-16 DIAGNOSIS — H5462 Unqualified visual loss, left eye, normal vision right eye: Secondary | ICD-10-CM | POA: Diagnosis present

## 2018-01-16 DIAGNOSIS — Z992 Dependence on renal dialysis: Secondary | ICD-10-CM

## 2018-01-16 DIAGNOSIS — J9601 Acute respiratory failure with hypoxia: Secondary | ICD-10-CM | POA: Diagnosis present

## 2018-01-16 DIAGNOSIS — I700 Atherosclerosis of aorta: Secondary | ICD-10-CM | POA: Diagnosis present

## 2018-01-16 DIAGNOSIS — G4730 Sleep apnea, unspecified: Secondary | ICD-10-CM | POA: Diagnosis present

## 2018-01-16 DIAGNOSIS — N186 End stage renal disease: Secondary | ICD-10-CM | POA: Diagnosis present

## 2018-01-16 DIAGNOSIS — G8929 Other chronic pain: Secondary | ICD-10-CM | POA: Diagnosis present

## 2018-01-16 DIAGNOSIS — H547 Unspecified visual loss: Secondary | ICD-10-CM

## 2018-01-16 DIAGNOSIS — Z79899 Other long term (current) drug therapy: Secondary | ICD-10-CM | POA: Diagnosis not present

## 2018-01-16 DIAGNOSIS — Z883 Allergy status to other anti-infective agents status: Secondary | ICD-10-CM

## 2018-01-16 DIAGNOSIS — J449 Chronic obstructive pulmonary disease, unspecified: Secondary | ICD-10-CM | POA: Diagnosis present

## 2018-01-16 DIAGNOSIS — Z881 Allergy status to other antibiotic agents status: Secondary | ICD-10-CM

## 2018-01-16 DIAGNOSIS — M549 Dorsalgia, unspecified: Secondary | ICD-10-CM | POA: Diagnosis present

## 2018-01-16 DIAGNOSIS — I639 Cerebral infarction, unspecified: Secondary | ICD-10-CM | POA: Diagnosis present

## 2018-01-16 DIAGNOSIS — Z7982 Long term (current) use of aspirin: Secondary | ICD-10-CM

## 2018-01-16 DIAGNOSIS — E785 Hyperlipidemia, unspecified: Secondary | ICD-10-CM | POA: Diagnosis present

## 2018-01-16 DIAGNOSIS — Z9115 Patient's noncompliance with renal dialysis: Secondary | ICD-10-CM

## 2018-01-16 DIAGNOSIS — D631 Anemia in chronic kidney disease: Secondary | ICD-10-CM | POA: Diagnosis present

## 2018-01-16 LAB — CBC
HCT: 30.2 % — ABNORMAL LOW (ref 35.0–47.0)
Hemoglobin: 10 g/dL — ABNORMAL LOW (ref 12.0–16.0)
MCH: 28.2 pg (ref 26.0–34.0)
MCHC: 33 g/dL (ref 32.0–36.0)
MCV: 85.6 fL (ref 80.0–100.0)
Platelets: 173 10*3/uL (ref 150–440)
RBC: 3.53 MIL/uL — ABNORMAL LOW (ref 3.80–5.20)
RDW: 15.9 % — ABNORMAL HIGH (ref 11.5–14.5)
WBC: 7.3 10*3/uL (ref 3.6–11.0)

## 2018-01-16 LAB — URINE DRUG SCREEN, QUALITATIVE (ARMC ONLY)
Amphetamines, Ur Screen: NOT DETECTED
Barbiturates, Ur Screen: NOT DETECTED
Benzodiazepine, Ur Scrn: NOT DETECTED
Cannabinoid 50 Ng, Ur ~~LOC~~: NOT DETECTED
Cocaine Metabolite,Ur ~~LOC~~: NOT DETECTED
MDMA (Ecstasy)Ur Screen: NOT DETECTED
Methadone Scn, Ur: NOT DETECTED
Opiate, Ur Screen: POSITIVE — AB
Phencyclidine (PCP) Ur S: NOT DETECTED
Tricyclic, Ur Screen: NOT DETECTED

## 2018-01-16 LAB — COMPREHENSIVE METABOLIC PANEL
ALT: 8 U/L — ABNORMAL LOW (ref 14–54)
AST: 14 U/L — ABNORMAL LOW (ref 15–41)
Albumin: 3.7 g/dL (ref 3.5–5.0)
Alkaline Phosphatase: 83 U/L (ref 38–126)
Anion gap: 14 (ref 5–15)
BUN: 48 mg/dL — ABNORMAL HIGH (ref 6–20)
CO2: 21 mmol/L — ABNORMAL LOW (ref 22–32)
Calcium: 7.4 mg/dL — ABNORMAL LOW (ref 8.9–10.3)
Chloride: 102 mmol/L (ref 101–111)
Creatinine, Ser: 7.28 mg/dL — ABNORMAL HIGH (ref 0.44–1.00)
GFR calc Af Amer: 6 mL/min — ABNORMAL LOW (ref >60)
GFR calc non Af Amer: 5 mL/min — ABNORMAL LOW (ref >60)
Glucose, Bld: 115 mg/dL — ABNORMAL HIGH (ref 65–99)
Potassium: 3.9 mmol/L (ref 3.5–5.1)
Sodium: 137 mmol/L (ref 135–145)
Total Bilirubin: 0.7 mg/dL (ref 0.3–1.2)
Total Protein: 6.8 g/dL (ref 6.5–8.1)

## 2018-01-16 LAB — DIFFERENTIAL
Basophils Absolute: 0 10*3/uL (ref 0–0.1)
Basophils Relative: 1 %
Eosinophils Absolute: 0.3 10*3/uL (ref 0–0.7)
Eosinophils Relative: 4 %
Lymphocytes Relative: 10 %
Lymphs Abs: 0.7 10*3/uL — ABNORMAL LOW (ref 1.0–3.6)
Monocytes Absolute: 0.5 10*3/uL (ref 0.2–0.9)
Monocytes Relative: 7 %
Neutro Abs: 5.7 10*3/uL (ref 1.4–6.5)
Neutrophils Relative %: 78 %

## 2018-01-16 LAB — URINALYSIS, ROUTINE W REFLEX MICROSCOPIC
Bilirubin Urine: NEGATIVE
Glucose, UA: NEGATIVE mg/dL
Hgb urine dipstick: NEGATIVE
Ketones, ur: NEGATIVE mg/dL
Nitrite: NEGATIVE
Protein, ur: 100 mg/dL — AB
Specific Gravity, Urine: 1.014 (ref 1.005–1.030)
pH: 7 (ref 5.0–8.0)

## 2018-01-16 LAB — TROPONIN I
Troponin I: 0.03 ng/mL (ref <0.03)
Troponin I: 0.03 ng/mL (ref <0.03)
Troponin I: 0.03 ng/mL (ref <0.03)

## 2018-01-16 LAB — PROTIME-INR
INR: 1.18
Prothrombin Time: 14.9 seconds (ref 11.4–15.2)

## 2018-01-16 LAB — ETHANOL: Alcohol, Ethyl (B): <10 mg/dL (ref <10)

## 2018-01-16 LAB — APTT: aPTT: 32 seconds (ref 24–36)

## 2018-01-16 MED ORDER — NITROGLYCERIN 0.4 MG SL SUBL: 0.4000 mg | SUBLINGUAL_TABLET | SUBLINGUAL | Status: DC | PRN

## 2018-01-16 MED ORDER — ACETAMINOPHEN 325 MG PO TABS: 650.0000 mg | ORAL_TABLET | ORAL | Status: DC | PRN

## 2018-01-16 MED ORDER — ASPIRIN 325 MG PO TABS
325.0000 mg | ORAL_TABLET | Freq: Every day | ORAL | Status: DC
  Administered 2018-01-17: 325 mg via ORAL
  Filled 2018-01-16 (×2): qty 1

## 2018-01-16 MED ORDER — CYCLOBENZAPRINE HCL 10 MG PO TABS
10.0000 mg | ORAL_TABLET | Freq: Three times a day (TID) | ORAL | Status: DC | PRN
Start: 2018-01-16 — End: 2018-01-18
  Administered 2018-01-17 – 2018-01-18 (×2): 10 mg via ORAL
  Filled 2018-01-16 (×2): qty 1

## 2018-01-16 MED ORDER — DOCUSATE SODIUM 100 MG PO CAPS: 100.0000 mg | ORAL_CAPSULE | Freq: Three times a day (TID) | ORAL | Status: DC | PRN

## 2018-01-16 MED ORDER — PANTOPRAZOLE SODIUM 40 MG PO TBEC
40.0000 mg | DELAYED_RELEASE_TABLET | Freq: Every day | ORAL | Status: DC
  Administered 2018-01-17 – 2018-01-18 (×2): 40 mg via ORAL
  Filled 2018-01-16 (×2): qty 1

## 2018-01-16 MED ORDER — PRAVASTATIN SODIUM 20 MG PO TABS
40.0000 mg | ORAL_TABLET | Freq: Every day | ORAL | Status: DC
  Administered 2018-01-17 – 2018-01-18 (×2): 40 mg via ORAL
  Filled 2018-01-16: qty 1
  Filled 2018-01-16: qty 2

## 2018-01-16 MED ORDER — IPRATROPIUM-ALBUTEROL 0.5-2.5 (3) MG/3ML IN SOLN
3.0000 mL | RESPIRATORY_TRACT | Status: DC
  Administered 2018-01-17: 3 mL via RESPIRATORY_TRACT
  Filled 2018-01-16: qty 3

## 2018-01-16 MED ORDER — LORATADINE 10 MG PO TABS
10.0000 mg | ORAL_TABLET | Freq: Every day | ORAL | Status: DC
  Administered 2018-01-17 – 2018-01-18 (×2): 10 mg via ORAL
  Filled 2018-01-16 (×2): qty 1

## 2018-01-16 MED ORDER — IPRATROPIUM-ALBUTEROL 0.5-2.5 (3) MG/3ML IN SOLN
3.0000 mL | Freq: Once | RESPIRATORY_TRACT | Status: AC
  Administered 2018-01-16: 3 mL via RESPIRATORY_TRACT

## 2018-01-16 MED ORDER — VITAMIN D (ERGOCALCIFEROL) 1.25 MG (50000 UNIT) PO CAPS
50000.0000 [IU] | ORAL_CAPSULE | ORAL | Status: DC
  Filled 2018-01-16: qty 1

## 2018-01-16 MED ORDER — ONDANSETRON HCL 4 MG PO TABS: 4.0000 mg | ORAL_TABLET | Freq: Three times a day (TID) | ORAL | Status: DC | PRN

## 2018-01-16 MED ORDER — ASPIRIN 300 MG RE SUPP: 300.0000 mg | Freq: Every day | RECTAL | Status: DC

## 2018-01-16 MED ORDER — ALBUTEROL SULFATE (2.5 MG/3ML) 0.083% IN NEBU: 3.0000 mL | INHALATION_SOLUTION | Freq: Four times a day (QID) | RESPIRATORY_TRACT | Status: DC | PRN

## 2018-01-16 MED ORDER — IPRATROPIUM-ALBUTEROL 0.5-2.5 (3) MG/3ML IN SOLN
RESPIRATORY_TRACT | Status: AC
  Filled 2018-01-16: qty 6

## 2018-01-16 MED ORDER — HYDROCODONE-ACETAMINOPHEN 5-325 MG PO TABS
1.0000 | ORAL_TABLET | Freq: Four times a day (QID) | ORAL | Status: DC | PRN
  Administered 2018-01-17 – 2018-01-18 (×3): 1 via ORAL
  Filled 2018-01-16 (×3): qty 1

## 2018-01-16 MED ORDER — CALCIUM ACETATE (PHOS BINDER) 667 MG PO CAPS
667.0000 mg | ORAL_CAPSULE | Freq: Three times a day (TID) | ORAL | Status: DC
  Administered 2018-01-17 – 2018-01-18 (×4): 667 mg via ORAL
  Filled 2018-01-16 (×7): qty 1

## 2018-01-16 MED ORDER — ASPIRIN 81 MG PO CHEW
324.0000 mg | CHEWABLE_TABLET | Freq: Once | ORAL | Status: AC
  Administered 2018-01-16: 324 mg via ORAL
  Filled 2018-01-16: qty 4

## 2018-01-16 MED ORDER — ACETAMINOPHEN 160 MG/5ML PO SOLN
650.0000 mg | ORAL | Status: DC | PRN
  Filled 2018-01-16: qty 20.3

## 2018-01-16 MED ORDER — AMLODIPINE BESYLATE 10 MG PO TABS
10.0000 mg | ORAL_TABLET | Freq: Every day | ORAL | Status: DC
  Administered 2018-01-17 – 2018-01-18 (×2): 10 mg via ORAL
  Filled 2018-01-16 (×2): qty 1

## 2018-01-16 MED ORDER — METHYLPREDNISOLONE SODIUM SUCC 125 MG IJ SOLR
60.0000 mg | Freq: Four times a day (QID) | INTRAMUSCULAR | Status: DC
  Administered 2018-01-16 – 2018-01-17 (×3): 60 mg via INTRAVENOUS
  Filled 2018-01-16 (×3): qty 2

## 2018-01-16 MED ORDER — ACETAMINOPHEN 650 MG RE SUPP: 650.0000 mg | RECTAL | Status: DC | PRN

## 2018-01-16 MED ORDER — OXYBUTYNIN CHLORIDE ER 15 MG PO TB24
15.0000 mg | ORAL_TABLET | Freq: Every day | ORAL | Status: DC
  Administered 2018-01-17 – 2018-01-18 (×2): 15 mg via ORAL
  Filled 2018-01-16 (×2): qty 1

## 2018-01-16 MED ORDER — HEPARIN SODIUM (PORCINE) 5000 UNIT/ML IJ SOLN
5000.0000 [IU] | Freq: Three times a day (TID) | INTRAMUSCULAR | Status: DC
  Administered 2018-01-17 – 2018-01-18 (×4): 5000 [IU] via SUBCUTANEOUS
  Filled 2018-01-16 (×4): qty 1

## 2018-01-16 MED ORDER — CARVEDILOL 25 MG PO TABS
12.5000 mg | ORAL_TABLET | Freq: Two times a day (BID) | ORAL | Status: DC
  Administered 2018-01-17 – 2018-01-18 (×3): 12.5 mg via ORAL
  Filled 2018-01-16 (×2): qty 1

## 2018-01-16 MED ORDER — STROKE: EARLY STAGES OF RECOVERY BOOK
Freq: Once | Status: AC
  Administered 2018-01-16: 18:00:00

## 2018-01-16 MED ORDER — ENOXAPARIN SODIUM 40 MG/0.4ML ~~LOC~~ SOLN: 40.0000 mg | SUBCUTANEOUS | Status: DC

## 2018-01-16 MED ORDER — MOMETASONE FURO-FORMOTEROL FUM 200-5 MCG/ACT IN AERO
2.0000 | INHALATION_SPRAY | Freq: Two times a day (BID) | RESPIRATORY_TRACT | Status: DC
  Administered 2018-01-16 – 2018-01-18 (×4): 2 via RESPIRATORY_TRACT
  Filled 2018-01-16: qty 8.8

## 2018-01-16 NOTE — Progress Notes (Signed)
HD tx start 

## 2018-01-16 NOTE — ED Provider Notes (Addendum)
[facility] Emergency Department Provider Note       Time seen: ----------------------------------------- 12:06 PM on 01/16/2018 -----------------------------------------   I have reviewed the triage vital signs and the nursing notes.  HISTORY   Chief Complaint Loss of Vision    HPI Phyllis [name] is a 72 y.o. female with a history of allergies, anxiety, asthma, COPD, end-stage renal disease on dialysis, gout, hyperlipidemia who presents to the ED for vision loss out of the left eye.  Patient saw her doctor this morning who sent her here for possible emboli in the eye.  Patient denies any other weakness or neurologic complaints.  She arrives alert and oriented.  Past Medical History:  Diagnosis Date  . Allergy   . Anemia of chronic disease   . Anxiety   . Aortic atherosclerosis ([place])   . Asthma   . Chronic back pain   . COPD (chronic obstructive pulmonary disease) ([place])   . Diabetes mellitus with complication ([place])   . ESRD on hemodialysis ([place])    a. Tues/Sat; b. 2/2 small kidneys  . Essential hypertension   . Fistula    lower left arm  . GERD (gastroesophageal reflux disease)   . Gout   . History of echocardiogram    a. TTE 01/2014: nl LV sys fxn, no valvular abnormalities; b. TTE 11/16: nl EF, mild LVH  . History of exercise stress test    a. 01/2014: no evidence of ischemia; b. Lexiscan 08/2015: no sig ischemia, severe GI uptake artifact, low risk; c. CPET @ [facility] 09/2016: exercised 3 min 12 sec on bike without incline, 2.28 METs, VO2 of 8.1, 48% of predicted, indicating mod to sev functional impairment, evidence of blunted HR, stroke volume, and BP augmentation as well as ventilation-perfusion mismatch with exercise  . HLD (hyperlipidemia)   . 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 Renal insufficiency 09/24/2017   Dialysis patient.  . Sleep apnea     Patient Active Problem List   Diagnosis Date Noted  . Aortic  atherosclerosis ([place]) 12/11/2017  . Chest pain 09/18/2017  . Chest discomfort 08/06/2015  . Hyperlipidemia 08/06/2015  . Complication from renal dialysis device 04/12/2015  . SOB (shortness of breath) 02/01/2015  . End stage renal disease ([place]) 02/01/2015  . Dependence on hemodialysis ([place]) 02/01/2015  . Type 2 diabetes mellitus with other specified complication ([place]) 16/08/9603  . Asthma 02/01/2015  . Chronic diastolic CHF (congestive heart failure) ([place]) 02/01/2015    Past Surgical History:  Procedure Laterality Date  . carpel tunnel    . GALLBLADDER SURGERY    . PERIPHERAL VASCULAR CATHETERIZATION N/A 04/12/2015   Procedure: A/V Shuntogram/Fistulagram;  Surgeon: Algernon Huxley, MD;  Location: [facility];  Service: Cardiovascular;  Laterality: N/A;  . PERIPHERAL VASCULAR CATHETERIZATION N/A 04/12/2015   Procedure: A/V Shunt Intervention;  Surgeon: Algernon Huxley, MD;  Location: [facility];  Service: Cardiovascular;  Laterality: N/A;  . PERIPHERAL VASCULAR CATHETERIZATION N/A 06/09/2015   Procedure: Dialysis/Perma Catheter Removal;  Surgeon: Katha Cabal, MD;  Location: [facility];  Service: Cardiovascular;  Laterality: N/A;    Allergies Codeine; Enalapril maleate; Nitrofurantoin; Sulfamethoxazole-trimethoprim; Vicodin [hydrocodone-acetaminophen]; 2,4-d dimethylamine (amisol); Baclofen; Neosporin [neomycin-bacitracin zn-polymyx]; Quinine; Ultram [tramadol]; Zocor [simvastatin]; Bactrim [sulfamethoxazole-trimethoprim]; Levodopa; Macrodantin [nitrofurantoin macrocrystal]; Quinine derivatives; and Vasotec [enalapril]  Social History Social History   Tobacco Use  . Smoking status: Never Smoker  . Smokeless tobacco: Never Used  Substance  Use Topics  . Alcohol use: No  . Drug use: No    Review of Systems Constitutional: Negative for fever. Eyes: Positive for painless left positive for painless vision loss in left eye upon awakening ENT:  Negative for  congestion, sore throat Cardiovascular: Negative for chest pain. Respiratory: Negative for shortness of breath. Gastrointestinal: Negative for abdominal pain, vomiting and diarrhea. Genitourinary: Negative for dysuria. Musculoskeletal: Negative for back pain. Skin: Negative for rash. Neurological: Negative for headaches, focal weakness or numbness.  All systems negative/normal/unremarkable except as stated in the HPI  ____________________________________________   PHYSICAL EXAM:  VITAL SIGNS: ED Triage Vitals  Enc Vitals Group     BP 01/16/18 1205 (!) 144/55     Pulse Rate 01/16/18 1205 81     Resp 01/16/18 1205 18     Temp 01/16/18 1205 98.1 F (36.7 C)     Temp Source 01/16/18 1205 Oral     SpO2 01/16/18 1205 97 %     Weight 01/16/18 1206 132 lb (59.9 kg)     Height 01/16/18 1206 4\' 10"  (1.473 m)     Head Circumference --      Peak Flow --      Pain Score --      Pain Loc --      Pain Edu? --      Excl. in [place]? --     Constitutional: Alert and oriented. Well appearing and in no distress. Eyes: Conjunctivae are normal. Normal extraocular movements.  Minimal left-sided peripheral vision in the left eye, otherwise total visual field deficit.  Pale retina on the left.  Normal right eye exam ENT   Head: Normocephalic and atraumatic.   Nose: No congestion/rhinnorhea.   Mouth/Throat: Mucous membranes are moist.   Neck: No stridor. Cardiovascular: Normal rate, regular rhythm. No murmurs, rubs, or gallops. Respiratory: Normal respiratory effort without tachypnea nor retractions. Breath sounds are clear and equal bilaterally. No wheezes/rales/rhonchi. Gastrointestinal: Soft and nontender. Normal bowel sounds Musculoskeletal: Nontender with normal range of motion in extremities. No lower extremity tenderness nor edema. Neurologic:  Normal speech and language. No gross focal neurologic deficits are appreciated.  Strength, sensation, cranial nerves are normal other than  total visual field deficit in the left eye Skin:  Skin is warm, dry and intact. No rash noted. Psychiatric: Mood and affect are normal. Speech and behavior are normal.  ____________________________________________  EKG: Interpreted by me.  Sinus rhythm the rate is 77 bpm, normal PR interval, normal QRS, normal QT, flat T waves are noted  ____________________________________________  ED COURSE:  As part of my medical decision making, I reviewed the following data within the [place] History obtained from family if available, nursing notes, old chart and ekg, as well as notes from prior ED visits. Patient presented for visual field loss in the left and central retinal artery occlusion according to ophthalmology, we will assess with labs and imaging as indicated at this time.   Procedures ____________________________________________   LABS (pertinent positives/negatives)  Labs Reviewed  CBC - Abnormal; Notable for the following components:      Result Value   RBC 3.53 (*)    Hemoglobin 10.0 (*)    HCT 30.2 (*)    RDW 15.9 (*)    All other components within normal limits  DIFFERENTIAL - Abnormal; Notable for the following components:   Lymphs Abs 0.7 (*)    All other components within normal limits  PROTIME-INR  APTT  COMPREHENSIVE METABOLIC PANEL  TROPONIN I  URINE DRUG SCREEN, QUALITATIVE ([facility] ONLY)  URINALYSIS, ROUTINE W REFLEX MICROSCOPIC  ETHANOL    RADIOLOGY Images were viewed by me  CT head IMPRESSION: 1. No acute intracranial abnormalities. 2. Mild cerebral atrophy with extensive chronic microvascular ischemic changes in the cerebral white matter. 3. Chronic left sphenoid sinus disease, similar to the prior study, as above. ____________________________________________  DIFFERENTIAL DIAGNOSIS   Central retinal artery occlusion, central retinal vein, CVA  FINAL ASSESSMENT AND PLAN  Central retinal artery occlusion, end-stage renal  disease on dialysis   Plan: Patient had presented for painless vision loss in the left eye noted upon awakening. Patient's labs did not reveal any acute process, she has known end-stage renal disease on dialysis. Patient's imaging was negative on CT for any acute process.  She will need an MRI.  I discussed with neurology who will see her in consult and we have given her an aspirin as she has not yet taken her today.   Laurence Aly, MD   Note: This note was generated in part or whole with voice recognition software. Voice recognition is usually quite accurate but there are transcription errors that can and very often do occur. I apologize for any typographical errors that were not detected and corrected.     Earleen Newport, MD 01/16/18 1304    Earleen Newport, MD 01/16/18 [phone]

## 2018-01-16 NOTE — ED Notes (Signed)
Pt is not being called a CODE STROKE, per MD work up with protocols

## 2018-01-16 NOTE — Progress Notes (Signed)
Post HD assessment  

## 2018-01-16 NOTE — Progress Notes (Signed)
HD tx end  

## 2018-01-16 NOTE — Progress Notes (Signed)
[facility]  ROUNDING NOTE   Subjective:   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     HEMODIALYSIS FLOWSHEET:  Blood Flow Rate (mL/min): 400 mL/min Arterial Pressure (mmHg): -260 mmHg Venous Pressure (mmHg): 160 mmHg Transmembrane Pressure (mmHg): 60 mmHg Ultrafiltration Rate (mL/min): 1150 mL/min Dialysate Flow Rate (mL/min): 600 ml/min Conductivity: Machine : 14.4 Conductivity: Machine : 14.4 Dialysis Fluid Bolus: Normal Saline Bolus Amount (mL): 250 mL    Objective:  Vital signs in last 24 hours:  Temp:  [98.1 F (36.7 C)-98.6 F (37 C)] 98.6 F (37 C) (02/27 1810) Pulse Rate:  [78-97] 85 (02/27 2015) Resp:  [15-35] 20 (02/27 2015) BP: (144-194)/(55-99) 162/72 (02/27 2015) SpO2:  [88 %-100 %] 100 % (02/27 2015) Weight:  [59.9 kg (132 lb)-60.9 kg (134 lb 4.2 oz)] 60.9 kg (134 lb 4.2 oz) (02/27 1810)  Weight change:  Filed Weights   01/16/18 1206 01/16/18 1810  Weight: 59.9 kg (132 lb) 60.9 kg (134 lb 4.2 oz)    Intake/Output: No intake/output data recorded.   Intake/Output this shift:  No intake/output data recorded.  Physical Exam: General: NAD,   Head: Normocephalic, atraumatic. Moist oral mucosal membranes  Eyes: Right sided blindness  Neck: Supple, trachea midline  Lungs:  Bilateral crackles  Heart: Regular rate and rhythm  Abdomen:  Soft, nontender,   Extremities: no peripheral edema.  Neurologic: Nonfocal, moving all four extremities  Skin: No lesions  Access: Left AVF    Basic Metabolic Panel: Recent Labs  Lab 01/16/18 1223  NA 137  K 3.9  CL 102  CO2 21*  GLUCOSE 115*  BUN 48*  CREATININE 7.28*  CALCIUM 7.4*    Liver Function Tests: Recent Labs  Lab 01/16/18 1223  AST 14*  ALT 8*  ALKPHOS 83  BILITOT 0.7  PROT 6.8  ALBUMIN 3.7   No results for  input(s): LIPASE, AMYLASE in the last 168 hours. No results for input(s): AMMONIA in the last 168 hours.  CBC: Recent Labs  Lab 01/16/18 1223  WBC 7.3  NEUTROABS 5.7  HGB 10.0*  HCT 30.2*  MCV 85.6  PLT 173    Cardiac Enzymes: Recent Labs  Lab 01/16/18 1223 01/16/18 1612  TROPONINI 0.03* 0.03*    BNP: Invalid input(s): POCBNP  CBG: No results for input(s): GLUCAP in the last 168 hours.  Microbiology: No results found for this or any previous visit.  Coagulation Studies: Recent Labs    01/16/18 1223  LABPROT 14.9  INR 1.18    Urinalysis: Recent Labs    01/16/18 1244  COLORURINE YELLOW*  LABSPEC 1.014  PHURINE 7.0  GLUCOSEU NEGATIVE  HGBUR NEGATIVE  BILIRUBINUR NEGATIVE  KETONESUR NEGATIVE  PROTEINUR 100*  NITRITE NEGATIVE  LEUKOCYTESUR LARGE*      Imaging: Ct Head Wo Contrast  Result Date: 01/16/2018 CLINICAL DATA:  72 year old female with vision loss in the left eye. EXAM: CT HEAD WITHOUT CONTRAST TECHNIQUE: Contiguous axial images were obtained from the base of the skull through the vertex without intravenous contrast. COMPARISON:  None. FINDINGS: Brain: Mild cerebral atrophy. Patchy and confluent areas of decreased attenuation are noted throughout the deep and periventricular white matter of the cerebral hemispheres bilaterally, compatible with chronic microvascular ischemic disease. No evidence of acute infarction, hemorrhage, hydrocephalus, extra-axial collection or mass lesion/mass effect. Vascular: No hyperdense vessel or unexpected calcification. Skull: Normal.  Negative for fracture or focal lesion. Sinuses/Orbits: Chronic mucoperiosteal thickening and opacification of much of the left sphenoid sinus, similar to the prior study. Other: None. IMPRESSION: 1. No acute intracranial abnormalities. 2. Mild cerebral atrophy with extensive chronic microvascular ischemic changes in the cerebral white matter. 3. Chronic left sphenoid sinus disease, similar to  the prior study, as above. Electronically Signed   By: Vinnie Langton M.D.   On: 01/16/2018 12:59   Dg Chest Portable 1 View  Result Date: 01/16/2018 CLINICAL DATA:  Chest pain EXAM: PORTABLE CHEST 1 VIEW COMPARISON:  09/21/2017, 09/11/2017, 12/04/2014 FINDINGS: Mild cardiomegaly. Diffuse interstitial opacity, suspect for pulmonary edema. Aortic atherosclerosis. No focal consolidation. No large effusion. No pneumothorax. IMPRESSION: 1. Cardiomegaly. Mild diffuse increased interstitial opacity, suspect acute interstitial edema or inflammation superimposed on chronic changes. 2. No focal pulmonary opacity or pleural effusion is seen. Electronically Signed   By: Donavan Foil M.D.   On: 01/16/2018 14:55     Medications:    . [START ON 01/17/2018] amLODipine  10 mg Oral Daily  . aspirin  300 mg Rectal Daily   Or  . aspirin  325 mg Oral Daily  . calcium acetate  667 mg Oral TID WC  . carvedilol  12.5 mg Oral BID WC  . heparin injection (subcutaneous)  5,000 Units Subcutaneous Q8H  . ipratropium-albuterol  3 mL Nebulization Q4H  . loratadine  10 mg Oral Daily  . methylPREDNISolone (SOLU-MEDROL) injection  60 mg Intravenous Q6H  . mometasone-formoterol  2 puff Inhalation BID  . [START ON 01/17/2018] oxybutynin  15 mg Oral Daily  . [START ON 01/17/2018] pantoprazole  40 mg Oral QAC breakfast  . pravastatin  40 mg Oral Daily  . Vitamin D (Ergocalciferol)  50,000 Units Oral Q Wed   acetaminophen **OR** acetaminophen (TYLENOL) oral liquid 160 mg/5 mL **OR** acetaminophen, albuterol, cyclobenzaprine, docusate sodium, HYDROcodone-acetaminophen, nitroGLYCERIN, ondansetron  Assessment/ Plan:  [name] is a 72 y.o. white female with end stage renal disease on hemodialysis, hypertension, hyperlipidemia, gout, GERD, diabetes mellitus type II, COPD  TTS [place]. Left AVF EDW 60.5kg  1. End Stage Renal Disease: seen and examined on hemodialysis. Tolerating treatment well.  Missed dialysis yesterday due to infiltration of AV fistula. However running well today.  - Next treatment for Saturday.   2. Hypertension: elevated on treatment. Seems to be volume related.  - amlodipine, carvedilol  - Concern for ischemic CVA. Appreciate neurology input. MRI pending.   3. Anemia of chronic kidney disease: hemoglobin 10. Holding ESA due to ischemic stroke.   4. Secondary Hyperparathyroidism:  - Calcium acetate with meals.    LOS: 0 Phyllis [name] 2/27/20198:24 PM

## 2018-01-16 NOTE — ED Notes (Signed)
Pt stating she cant catch her breath and feels she needs oxygen. Pt  O2 currently at 100%. RN at bedside, pt O2 drops to 85-86%, 2L O2 applied with no relief. Pt increased to 5L with O2 at 92% now. MD Jimmye Norman at bedside. Medication ordered

## 2018-01-16 NOTE — ED Triage Notes (Addendum)
Pt comes into the ED via POV c/o vision loss out of the lefty eye.  Patient saw her MD this morning who sent her here for possible emboli in the eye.  Patient denies any other weakness or neurological complaints.  Patient is alert and oriented x4. Patient last seen normal at 3:00 this morning and woke up at 8:30 with the vision loss.

## 2018-01-16 NOTE — Progress Notes (Signed)
Pre HD assessment  

## 2018-01-16 NOTE — ED Notes (Addendum)
MD Posey Pronto paged.MD Posey Pronto notified of patient breathing change. MD states he will come check her out.

## 2018-01-16 NOTE — ED Notes (Signed)
MD Patel at bedside.

## 2018-01-16 NOTE — ED Notes (Signed)
Per HS,  Patel MD states he will make some calls and let us know if patient is still appropriate for 1C.

## 2018-01-16 NOTE — ED Notes (Addendum)
MD Posey Pronto requesting repeat EKG

## 2018-01-16 NOTE — ED Notes (Signed)
Patient transported to CT 

## 2018-01-16 NOTE — ED Notes (Addendum)
MD Posey Pronto at bedside. Patient to be admitted to ICU, possible BIPAP

## 2018-01-16 NOTE — H&P (Signed)
[place] at [place] NAME: Phyllis [name]    MR#:  [id number]  DATE OF BIRTH:  1946-01-01  DATE OF ADMISSION:  01/16/2018  PRIMARY CARE PHYSICIAN: [name], MD   REQUESTING/REFERRING PHYSICIAN: Lenise Arena MD  CHIEF COMPLAINT:   Chief Complaint  Patient presents with  . Loss of Vision    HISTORY OF PRESENT ILLNESS: Phyllis [name]  is a 72 y.o. female with a known history of end-stage renal disease on hemodialysis, COPD, chronic back pain, diabetes type 2, essential hypertension, GERD, gout, hyperlipidemia and sleep apnea but does not use a CPAP machine who woke up this morning with complete left eye vision loss.  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  Therefore she was referred to the ED.  The ER physician has spoken to the neurologist who recommends stroke workup.  Patient besides having difficulty with left-sided vision loss has no other complaints. She states that she is supposed to be getting dialyzed on Tuesday and Saturday and was unable to get dialyzed because of infiltration of the IV prior to dialysis.   PAST MEDICAL HISTORY:   Past Medical History:  Diagnosis Date  . Allergy   . Anemia of chronic disease   . Anxiety   . Aortic atherosclerosis ([place])   . Asthma   . Chronic back pain   . COPD (chronic obstructive pulmonary disease) ([place])   . Diabetes mellitus with complication ([place])   . ESRD on hemodialysis ([place])    a. Tues/Sat; b. 2/2 small kidneys  . Essential hypertension   . Fistula    lower left arm  . GERD (gastroesophageal reflux disease)   . Gout   . History of echocardiogram    a. TTE 01/2014: nl LV sys fxn, no valvular abnormalities; b. TTE 11/16: nl EF, mild LVH  . History of exercise stress test    a. 01/2014: no evidence of ischemia; b. Lexiscan 08/2015: no sig ischemia, severe GI uptake artifact, low  risk; c. CPET @ [facility] 09/2016: exercised 3 min 12 sec on bike without incline, 2.28 METs, VO2 of 8.1, 48% of predicted, indicating mod to sev functional impairment, evidence of blunted HR, stroke volume, and BP augmentation as well as ventilation-perfusion mismatch with exercise  . HLD (hyperlipidemia)   . 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 Renal insufficiency 09/24/2017   Dialysis patient.  . Sleep apnea     PAST SURGICAL HISTORY:  Past Surgical History:  Procedure Laterality Date  . carpel tunnel    . GALLBLADDER SURGERY    . PERIPHERAL VASCULAR CATHETERIZATION N/A 04/12/2015   Procedure: A/V Shuntogram/Fistulagram;  Surgeon: Algernon Huxley, MD;  Location: [place] [facility];  Service: Cardiovascular;  Laterality: N/A;  . PERIPHERAL VASCULAR CATHETERIZATION N/A 04/12/2015   Procedure: A/V Shunt Intervention;  Surgeon: Algernon Huxley, MD;  Location: [place] [facility];  Service: Cardiovascular;  Laterality: N/A;  . PERIPHERAL VASCULAR CATHETERIZATION N/A 06/09/2015   Procedure: Dialysis/Perma Catheter Removal;  Surgeon: Katha Cabal, MD;  Location: [place] [facility];  Service: Cardiovascular;  Laterality: N/A;    SOCIAL HISTORY:  Social History   Tobacco Use  . Smoking status: Never Smoker  . Smokeless tobacco: Never Used  Substance Use Topics  . Alcohol use: No    FAMILY HISTORY:  Family History  Problem Relation Age  of Onset  . Hypertension Mother   . Hyperlipidemia Mother   . Heart disease Father   . Heart attack Father 51  . Hypertension Father   . Hyperlipidemia Father   . Heart disease Brother        CABG   . Heart attack Brother   . Breast cancer Neg Hx     DRUG ALLERGIES:  Allergies  Allergen Reactions  . Codeine Nausea And Vomiting and Nausea Only  . Enalapril Maleate     Other reaction(s): Headache  . Nitrofurantoin Swelling and Rash    Other Reaction: swelling of body  . Sulfamethoxazole-Trimethoprim Swelling  . Vicodin  [Hydrocodone-Acetaminophen] Nausea And Vomiting and Nausea Only  . 2,4-D Dimethylamine (Amisol) Rash    Other Reaction: h/a  . Baclofen Other (See Comments) and Nausea Only    lightheadness ,drowsiness , muscle weakness , twitching in hands   . Neosporin [Neomycin-Bacitracin Zn-Polymyx] Other (See Comments) and Rash    Other Reaction: irritation Skin irritation   . Quinine Rash    Other Reaction: Vomiting rash, h/a, vision  . Ultram [Tramadol] Palpitations  . Zocor [Simvastatin] Other (See Comments) and Rash    Other Reaction: muscle spasms Muscle pain and spasms  . Bactrim [Sulfamethoxazole-Trimethoprim] Swelling  . Levodopa   . Macrodantin [Nitrofurantoin Macrocrystal] Swelling  . Quinine Derivatives Other (See Comments)    Vertigo,nausea vomiting blurred vision headache ears sensitivity   . Vasotec [Enalapril] Other (See Comments)    Headaches     REVIEW OF SYSTEMS:   CONSTITUTIONAL: No fever, fatigue or weakness.  EYES: Left eye complete vision loss.  EARS, NOSE, AND THROAT: No tinnitus or ear pain.  RESPIRATORY: No cough, shortness of breath, wheezing or hemoptysis.  CARDIOVASCULAR: No chest pain, orthopnea, edema.  GASTROINTESTINAL: No nausea, vomiting, diarrhea or abdominal pain.  GENITOURINARY: No dysuria, hematuria.  ENDOCRINE: No polyuria, nocturia,  HEMATOLOGY: No anemia, easy bruising or bleeding SKIN: No rash or lesion. MUSCULOSKELETAL: No joint pain or arthritis.   NEUROLOGIC: No tingling, numbness, weakness.  PSYCHIATRY: No anxiety or depression.   MEDICATIONS AT HOME:  Prior to Admission medications   Medication Sig Start Date End Date Taking? Authorizing Provider  albuterol (PROVENTIL HFA;VENTOLIN HFA) 108 (90 Base) MCG/ACT inhaler Inhale 2 puffs into the lungs every 6 (six) hours as needed for wheezing or shortness of breath.   Yes [provider]  amLODipine (NORVASC) 10 MG tablet Take 10 mg by mouth.  12/29/13  Yes [provider]   aspirin EC 81 MG EC tablet Take 1 tablet (81 mg total) by mouth daily. 09/20/17  Yes Mody, Ulice Bold, MD  calcium acetate (PHOSLO) 667 MG capsule Take 667 mg by mouth 3 (three) times daily with meals.    Yes [provider]  carvedilol (COREG) 12.5 MG tablet Take 12.5 mg by mouth 2 (two) times daily with a meal.  01/09/14  Yes [provider]  cetirizine (ZYRTEC) 10 MG tablet Take 10 mg by mouth as needed for allergies.   Yes [provider]  cyclobenzaprine (FLEXERIL) 10 MG tablet Take 10 mg by mouth 3 (three) times daily as needed for muscle spasms.   Yes [provider]  docusate sodium (COLACE) 100 MG capsule Take 100 mg by mouth 3 (three) times daily as needed for mild constipation.   Yes [provider]  Fluticasone-Salmeterol (ADVAIR) 250-50 MCG/DOSE AEPB Inhale 1 puff into the lungs 2 (two) times daily.   Yes [provider]  HYDROcodone-acetaminophen [facility])  5-325 MG tablet Take 1 tablet by mouth every 6 (six) hours as needed for moderate pain or severe pain. 08/16/17  Yes Eula Listen, MD  lidocaine-prilocaine (EMLA) cream Apply 1 application topically as needed.   Yes [provider]  nitroGLYCERIN (NITROSTAT) 0.4 MG SL tablet Place 1 tablet (0.4 mg total) under the tongue every 5 (five) minutes as needed for chest pain. 09/21/17  Yes Strader, [place] M, PA-C  omeprazole (PRILOSEC) 20 MG capsule Take 20 mg by mouth 2 (two) times daily before a meal.   Yes [provider]  ondansetron (ZOFRAN) 4 MG tablet Take 4 mg by mouth every 8 (eight) hours as needed for nausea or vomiting.   Yes [provider]  oxybutynin (DITROPAN XL) 15 MG 24 hr tablet Take 15 mg by mouth daily. 12/24/17  Yes [provider]  pravastatin (PRAVACHOL) 40 MG tablet Take 40 mg by mouth daily.  12/29/13  Yes [provider]  senna (SENOKOT) 8.6 MG tablet Take 1 tablet by mouth daily.   Yes [provider]   topiramate (TOPAMAX) 25 MG tablet Take 25 mg by mouth as needed.  01/14/14  Yes [provider]  Vitamin D, Ergocalciferol, (DRISDOL) 50000 UNITS CAPS capsule Take 50,000 Units by mouth every Wednesday.  12/29/13  Yes [provider]      PHYSICAL EXAMINATION:   VITAL SIGNS: Blood pressure (!) 157/71, pulse 78, temperature 98.6 F (37 C), resp. rate (!) 24, height 4\' 10"  (1.473 m), weight 132 lb (59.9 kg), SpO2 98 %.  GENERAL:  72 y.o.-year-old patient lying in the bed with no acute distress.  EYES: Pupils equal, round, reactive to light and accommodation. No scleral icterus. Extraocular muscles intact.  HEENT: Head atraumatic, normocephalic. Oropharynx and nasopharynx clear.  NECK:  Supple, no jugular venous distention. No thyroid enlargement, no tenderness.  LUNGS: Normal breath sounds bilaterally, no wheezing, rales,rhonchi or crepitation. No use of accessory muscles of respiration.  CARDIOVASCULAR: S1, S2 normal. No murmurs, rubs, or gallops.  ABDOMEN: Soft, nontender, nondistended. Bowel sounds present. No organomegaly or mass.  EXTREMITIES: No pedal edema, cyanosis, or clubbing.  NEUROLOGIC: Cranial nerves II through XII are intact. Muscle strength 5/5 in all extremities. Sensation intact. Gait not checked.  Left eye complete vision loss PSYCHIATRIC: The patient is alert and oriented x 3.  SKIN: No obvious rash, lesion, or ulcer.   LABORATORY PANEL:   CBC Recent Labs  Lab 01/16/18 1223  WBC 7.3  HGB 10.0*  HCT 30.2*  PLT 173  MCV 85.6  MCH 28.2  MCHC 33.0  RDW 15.9*  LYMPHSABS 0.7*  MONOABS 0.5  EOSABS 0.3  BASOSABS 0.0   ------------------------------------------------------------------------------------------------------------------  Chemistries  Recent Labs  Lab 01/16/18 1223  NA 137  K 3.9  CL 102  CO2 21*  GLUCOSE 115*  BUN 48*  CREATININE 7.28*  CALCIUM 7.4*  AST 14*  ALT 8*  ALKPHOS 83  BILITOT 0.7    ------------------------------------------------------------------------------------------------------------------ estimated creatinine clearance is 5.4 mL/min (A) (by C-G formula based on SCr of 7.28 mg/dL (H)). ------------------------------------------------------------------------------------------------------------------ No results for input(s): TSH, T4TOTAL, T3FREE, THYROIDAB in the last 72 hours.  Invalid input(s): FREET3   Coagulation profile Recent Labs  Lab 01/16/18 1223  INR 1.18   ------------------------------------------------------------------------------------------------------------------- No results for input(s): DDIMER in the last 72 hours. -------------------------------------------------------------------------------------------------------------------  Cardiac Enzymes Recent Labs  Lab 01/16/18 1223  TROPONINI 0.03*   ------------------------------------------------------------------------------------------------------------------ Invalid input(s): POCBNP  ---------------------------------------------------------------------------------------------------------------  Urinalysis    Component Value Date/Time  COLORURINE YELLOW (A) 09/14/2017 2038   APPEARANCEUR HAZY (A) 09/14/2017 2038   LABSPEC 1.014 09/14/2017 2038   PHURINE 7.0 09/14/2017 2038   GLUCOSEU NEGATIVE 09/14/2017 2038   HGBUR NEGATIVE 09/14/2017 2038   BILIRUBINUR NEGATIVE 09/14/2017 2038   KETONESUR NEGATIVE 09/14/2017 2038   PROTEINUR >=300 (A) 09/14/2017 2038   NITRITE NEGATIVE 09/14/2017 2038   LEUKOCYTESUR TRACE (A) 09/14/2017 2038     RADIOLOGY: Ct Head Wo Contrast  Result Date: 01/16/2018 CLINICAL DATA:  72 year old female with vision loss in the left eye. EXAM: CT HEAD WITHOUT CONTRAST TECHNIQUE: Contiguous axial images were obtained from the base of the skull through the vertex without intravenous contrast. COMPARISON:  None. FINDINGS: Brain: Mild cerebral atrophy. Patchy  and confluent areas of decreased attenuation are noted throughout the deep and periventricular white matter of the cerebral hemispheres bilaterally, compatible with chronic microvascular ischemic disease. No evidence of acute infarction, hemorrhage, hydrocephalus, extra-axial collection or mass lesion/mass effect. Vascular: No hyperdense vessel or unexpected calcification. Skull: Normal. Negative for fracture or focal lesion. Sinuses/Orbits: Chronic mucoperiosteal thickening and opacification of much of the left sphenoid sinus, similar to the prior study. Other: None. IMPRESSION: 1. No acute intracranial abnormalities. 2. Mild cerebral atrophy with extensive chronic microvascular ischemic changes in the cerebral white matter. 3. Chronic left sphenoid sinus disease, similar to the prior study, as above. Electronically Signed   By: Vinnie Langton M.D.   On: 01/16/2018 12:59    EKG: Orders placed or performed during the hospital encounter of 01/16/18  . ED EKG  . ED EKG  . EKG 12-Lead  . EKG 12-Lead    IMPRESSION AND PLAN: Patient 72 year old presenting with complete left-sided vision loss  1.  Acute CVA leading to left-sided visual loss MRI and MRA of the brain Aspirin is recommended by neurology Neurology Telemetry Echocardiogram of the heart Patient has passed her swallow eval for  2.  End-stage renal disease patient missed her dialysis yesterday will need her hemodialysis Nephrology consult   3.  Essential hypertension continue amlodipine and Coreg  4.  GERD continue omeprazole  5.  Hyperlipidemia continue Pravachol  6.  Miscellaneous heparin for DVT prophylaxis  7.  CODE STATUS DNR confirmed with patient  8. Heparin for dvt proph   All the records are reviewed and case discussed with ED provider. Management plans discussed with the patient, family and they are in agreement.  CODE STATUS:    Code Status Orders  (From admission, onward)        Start     Ordered    01/16/18 1341  Do not attempt resuscitation (DNR)  Continuous    Question Answer Comment  In the event of cardiac or respiratory ARREST Do not call a "code blue"   In the event of cardiac or respiratory ARREST Do not perform Intubation, CPR, defibrillation or ACLS   In the event of cardiac or respiratory ARREST Use medication by any route, position, wound care, and other measures to relive pain and suffering. May use oxygen, suction and manual treatment of airway obstruction as needed for comfort.   Comments nurse may pronounce      01/16/18 1340    Code Status History    Date Active Date Inactive Code Status Order ID Comments User Context   09/18/2017 13:48 09/19/2017 15:13 DNR [id number]  Loletha Grayer, MD ED   04/12/2015 10:56 04/12/2015 16:31 Full Code [id number]  Algernon Huxley, MD Inpatient       TOTAL TIME  TAKING CARE OF THIS PATIENT55 minutes.    Dustin Flock M.D on 01/16/2018 at 1:59 PM  Between 7am to 6pm - Pager - [phone]  After 6pm go to [email or web address] - password [email or web address] [facility]  Office  [phone]  CC: Primary care physician; [name], MD

## 2018-01-16 NOTE — Progress Notes (Signed)
Post HD assessment. Pt tolerated tx well without c/o or complications. Net UF 2806, goal met.

## 2018-01-16 NOTE — Progress Notes (Signed)
Patient reevaluated after nebulizer therapy seems to be doing better, respirations improved I have talked to nephrology she will need dialysis patient may go to the floor.

## 2018-01-16 NOTE — Progress Notes (Signed)
[facility]                                                                                                                                                                                  Patient Demographics   Phyllis [name], is a 72 y.o. female, DOB - [birth year], [place] date - 01/16/2018   Admitting Physician No admitting provider for patient encounter.  Outpatient Primary MD for the patient is [name], MD   LOS - 0  Subjective: While the patient was waiting in the emergency room for bed she started complaining of shortness of breath chest pain.  Asking for her inhalers. I reevaluated the patient she is sitting at the side of the bed, short of breath wheezing and tachypneic    Review of Systems:   CONSTITUTIONAL: No documented fever. No fatigue, weakness. No weight gain, no weight loss.  EYES: No blurry or double vision.  ENT: No tinnitus. No postnasal drip. No redness of the oropharynx.  RESPIRATORY: No cough, no wheeze, no hemoptysis.  Positive dyspnea.  CARDIOVASCULAR: Positive chest pain. No orthopnea. No palpitations. No syncope.  GASTROINTESTINAL: No nausea, no vomiting or diarrhea. No abdominal pain. No melena or hematochezia.  GENITOURINARY: No dysuria or hematuria.  ENDOCRINE: No polyuria or nocturia. No heat or cold intolerance.  HEMATOLOGY: No anemia. No bruising. No bleeding.  INTEGUMENTARY: No rashes. No lesions.  MUSCULOSKELETAL: No arthritis. No swelling. No gout.  NEUROLOGIC: No numbness, tingling, or ataxia. No seizure-type activity.  PSYCHIATRIC: No anxiety. No insomnia. No ADD.    Vitals:   Vitals:   01/16/18 1300 01/16/18 1330 01/16/18 1345 01/16/18 1400  BP: (!) 157/71 (!) 164/71  (!) 158/70  Pulse: 78 79 79   Resp: (!) 24 (!) 22 (!) 27 (!) 23  Temp:      TempSrc:      SpO2: 98% 96% 96%   Weight:      Height:        Wt Readings from Last 3 Encounters:  01/16/18 132 lb (59.9 kg)   12/12/17 134 lb 8 oz (61 kg)  10/07/17 140 lb (63.5 kg)    No intake or output data in the 24 hours ending 01/16/18 1447  Physical Exam:   GENERAL: Uncomfortable sitting at the side of the bed HEAD, EYES, EARS, NOSE AND THROAT: Atraumatic, normocephalic. Extraocular muscles are intact. Pupils equal and reactive to light. Sclerae anicteric. No conjunctival injection. No oro-pharyngeal erythema.  NECK: Supple. There is no jugular venous distention. No bruits, no lymphadenopathy, no thyromegaly.  HEART: Regular rate and rhythm,. No murmurs, no rubs, no clicks.  LUNGS: Bilateral wheezing throughout  both lungs  ABDOMEN: Soft, flat, nontender, nondistended. Has good bowel sounds. No hepatosplenomegaly appreciated.  EXTREMITIES: No evidence of any cyanosis, clubbing, or peripheral edema.  +2 pedal and radial pulses bilaterally.  NEUROLOGIC: The patient is alert, awake, and oriented x3 with no focal motor or sensory deficits appreciated bilaterally.  SKIN: Moist and warm with no rashes appreciated.  Psych: Not anxious, depressed LN: No inguinal LN enlargement    Antibiotics   Anti-infectives (From admission, onward)   None      Medications   Scheduled Meds: . amLODipine  10 mg Oral Daily  . calcium acetate  667 mg Oral TID WC  . carvedilol  12.5 mg Oral BID WC  . ipratropium-albuterol  3 mL Nebulization Q4H  . loratadine  10 mg Oral Daily  . methylPREDNISolone (SOLU-MEDROL) injection  60 mg Intravenous Q6H  . mometasone-formoterol  2 puff Inhalation BID  . oxybutynin  15 mg Oral Daily  . pantoprazole  40 mg Oral Daily  . pravastatin  40 mg Oral Daily  . Vitamin D (Ergocalciferol)  50,000 Units Oral Q Wed   Continuous Infusions: PRN Meds:.albuterol, cyclobenzaprine, docusate sodium, HYDROcodone-acetaminophen, nitroGLYCERIN, ondansetron   Data Review:   Micro Results No results found for this or any previous visit (from the past 240 hour(s)).  Radiology Reports Ct Head Wo  Contrast  Result Date: 01/16/2018 CLINICAL DATA:  72 year old female with vision loss in the left eye. EXAM: CT HEAD WITHOUT CONTRAST TECHNIQUE: Contiguous axial images were obtained from the base of the skull through the vertex without intravenous contrast. COMPARISON:  None. FINDINGS: Brain: Mild cerebral atrophy. Patchy and confluent areas of decreased attenuation are noted throughout the deep and periventricular white matter of the cerebral hemispheres bilaterally, compatible with chronic microvascular ischemic disease. No evidence of acute infarction, hemorrhage, hydrocephalus, extra-axial collection or mass lesion/mass effect. Vascular: No hyperdense vessel or unexpected calcification. Skull: Normal. Negative for fracture or focal lesion. Sinuses/Orbits: Chronic mucoperiosteal thickening and opacification of much of the left sphenoid sinus, similar to the prior study. Other: None. IMPRESSION: 1. No acute intracranial abnormalities. 2. Mild cerebral atrophy with extensive chronic microvascular ischemic changes in the cerebral white matter. 3. Chronic left sphenoid sinus disease, similar to the prior study, as above. Electronically Signed   By: Vinnie Langton M.D.   On: 01/16/2018 12:59     CBC Recent Labs  Lab 01/16/18 1223  WBC 7.3  HGB 10.0*  HCT 30.2*  PLT 173  MCV 85.6  MCH 28.2  MCHC 33.0  RDW 15.9*  LYMPHSABS 0.7*  MONOABS 0.5  EOSABS 0.3  BASOSABS 0.0    Chemistries  Recent Labs  Lab 01/16/18 1223  NA 137  K 3.9  CL 102  CO2 21*  GLUCOSE 115*  BUN 48*  CREATININE 7.28*  CALCIUM 7.4*  AST 14*  ALT 8*  ALKPHOS 83  BILITOT 0.7   ------------------------------------------------------------------------------------------------------------------ estimated creatinine clearance is 5.4 mL/min (A) (by C-G formula based on SCr of 7.28 mg/dL (H)). ------------------------------------------------------------------------------------------------------------------ No results  for input(s): HGBA1C in the last 72 hours. ------------------------------------------------------------------------------------------------------------------ No results for input(s): CHOL, HDL, LDLCALC, TRIG, CHOLHDL, LDLDIRECT in the last 72 hours. ------------------------------------------------------------------------------------------------------------------ No results for input(s): TSH, T4TOTAL, T3FREE, THYROIDAB in the last 72 hours.  Invalid input(s): FREET3 ------------------------------------------------------------------------------------------------------------------ No results for input(s): VITAMINB12, FOLATE, FERRITIN, TIBC, IRON, RETICCTPCT in the last 72 hours.  Coagulation profile Recent Labs  Lab 01/16/18 1223  INR 1.18    No results for input(s): DDIMER in the last  72 hours.  Cardiac Enzymes Recent Labs  Lab 01/16/18 1223  TROPONINI 0.03*   ------------------------------------------------------------------------------------------------------------------ Invalid input(s): POCBNP    Assessment & Plan   Patient is a 72 year old white female was admitted for acute CVA now complaining of respiratory difficulty  1.  Acute respiratory failure Patient started on nebulizer therapy I will treat her with Solu-Medrol  stat chest x-ray Stat ekg Stat cardiac enzymes I have discussed with nephrology since she has missed her dialysis that she may need her dialysis Patient may need to be placed on BiPAP if not improved with breathing treatment       Code Status Orders  (From admission, onward)        Start     Ordered   01/16/18 1341  Do not attempt resuscitation (DNR)  Continuous    Question Answer Comment  In the event of cardiac or respiratory ARREST Do not call a "code blue"   In the event of cardiac or respiratory ARREST Do not perform Intubation, CPR, defibrillation or ACLS   In the event of cardiac or respiratory ARREST Use medication by any route,  position, wound care, and other measures to relive pain and suffering. May use oxygen, suction and manual treatment of airway obstruction as needed for comfort.   Comments nurse may pronounce      01/16/18 1340    Code Status History    Date Active Date Inactive Code Status Order ID Comments User Context   09/18/2017 13:48 09/19/2017 15:13 DNR [id number]  Loletha Grayer, MD ED   04/12/2015 10:56 04/12/2015 16:31 Full Code [id number]  Algernon Huxley, MD Inpatient           Consults nephrology   DVT Prophylaxis heparin  Lab Results  Component Value Date   PLT 173 01/16/2018     Time Spent in minutes 85 minutes of additional critical care time spent on this patient greater than 50% of time spent in care coordination and counseling patient regarding the condition and plan of care.   Dustin Flock M.D on 01/16/2018 at 2:47 PM  Between 7am to 6pm - Pager - [phone]  After 6pm go to [email or web address] - password [email or web address] [facility]   Office  [phone]

## 2018-01-16 NOTE — ED Notes (Signed)
Right eye-20/30 Left eye-0

## 2018-01-17 ENCOUNTER — Inpatient Hospital Stay: Payer: Medicare Other

## 2018-01-17 ENCOUNTER — Inpatient Hospital Stay
Admit: 2018-01-17 | Discharge: 2018-01-17 | Disposition: A | Discharge status: Home / Self Care | Payer: Medicare Other | Attending: Internal Medicine | Admitting: Internal Medicine

## 2018-01-17 DIAGNOSIS — H3412 Central retinal artery occlusion, left eye: Principal | ICD-10-CM

## 2018-01-17 LAB — SEDIMENTATION RATE: Sed Rate: 60 mm/hr — ABNORMAL HIGH (ref 0–30)

## 2018-01-17 LAB — ECHOCARDIOGRAM COMPLETE
Height: 58 in
Weight: 2049.4 oz

## 2018-01-17 LAB — TROPONIN I: Troponin I: 0.03 ng/mL (ref <0.03)

## 2018-01-17 LAB — LIPID PANEL
Cholesterol: 180 mg/dL (ref 0–200)
HDL: 48 mg/dL (ref >40)
LDL Cholesterol: 101 mg/dL — ABNORMAL HIGH (ref 0–99)
Total CHOL/HDL Ratio: 3.8 RATIO
Triglycerides: 154 mg/dL — ABNORMAL HIGH (ref <150)
VLDL: 31 mg/dL (ref 0–40)

## 2018-01-17 LAB — HEMOGLOBIN A1C
Hgb A1c MFr Bld: 4.6 % — ABNORMAL LOW (ref 4.8–5.6)
Mean Plasma Glucose: 85.32 mg/dL

## 2018-01-17 MED ORDER — IPRATROPIUM-ALBUTEROL 0.5-2.5 (3) MG/3ML IN SOLN
3.0000 mL | Freq: Four times a day (QID) | RESPIRATORY_TRACT | Status: DC
  Administered 2018-01-17 – 2018-01-18 (×4): 3 mL via RESPIRATORY_TRACT
  Filled 2018-01-17 (×2): qty 3
  Filled 2018-01-17: qty 30
  Filled 2018-01-17: qty 3

## 2018-01-17 MED ORDER — IOPAMIDOL (ISOVUE-370) INJECTION 76%
75.0000 mL | Freq: Once | INTRAVENOUS | Status: AC | PRN
  Administered 2018-01-17: 15:00:00 75 mL via INTRAVENOUS

## 2018-01-17 MED ORDER — CLOPIDOGREL BISULFATE 75 MG PO TABS
75.0000 mg | ORAL_TABLET | Freq: Every day | ORAL | Status: DC
  Administered 2018-01-17 – 2018-01-18 (×2): 75 mg via ORAL
  Filled 2018-01-17: qty 1

## 2018-01-17 NOTE — Evaluation (Signed)
Occupational Therapy Evaluation Patient Details Name: Phyllis [name] MRN: [id number] DOB: [birth year] Today's Date: 01/17/2018    History of Present Illness Phyllis [name]  is a 72 y.o. female with a known history of end-stage renal disease on hemodialysis, COPD, chronic back pain, diabetes type 2, essential hypertension, GERD, gout, hyperlipidemia and sleep apnea but does not use a CPAP machine who woke up 01-16-18 with complete left eye vision loss.  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  Therefore she was referred to the ED.  The ER physician has spoken to the neurologist who recommends stroke workup.  Patient besides having difficulty with left-sided vision loss has no other complaints.   Clinical Impression   Pt is 72year old female who presents to [facility] hospital with above history and new onset of complete L eye vision loss.  CT was negative for new CVA and to have MRI today as well. She receives dialysis 2 days a week and lives with her friend [name] in a 2 story home living on the main level. She was independent in all ADLs prior to admission but used a cane or walker with cues and assist from her friend [name] for balance.  She was receiving outpatient PT at [facility] outpatient clinic on [address] (by [name]).  She is on 3L of O2 nasal cannula with mild SOB and dizziness when first transitioning from lying to sitting EOB.  She was not using any O2 at home. Standing and ambulation deferred due to dizziness and not having MRI done yet.  She currently needs supervision to min assist for ADLs due to SOB, fatigue, weakness and would benefit from instruction in energy conservation tech and training during ADLs in addition to vision training to compensate for L eye vision loss.  Reviewed compensatory tech for impaired vision.  Pt is interest in talking to PT about a Rollator .  Pt would benefit from skilled OT  services to increase independence in ADLs, education in energy conservation techniques, pursed lip breathing and recommendations for home modifications to increase safety and prevent falls.  Rec OT HH after discharge from hospital.    Follow Up Recommendations  Home health OT    Equipment Recommendations  Toilet rise with handles    Recommendations for Other Services       Precautions / Restrictions Precautions Precautions: Fall Precaution Comments: On 3L of nasal cannula O2 Restrictions Weight Bearing Restrictions: No      Mobility Bed Mobility                  Transfers                      Balance                                           ADL either performed or assessed with clinical judgement   ADL Overall ADL's : Needs assistance/impaired Eating/Feeding: Independent;Set up   Grooming: Wash/dry hands;Wash/dry face;Oral care;Brushing hair;Set up           Upper Body Dressing : Supervision/safety;Set up   Lower Body Dressing: Set up;Min guard;Supervision/safety Lower Body Dressing Details (indicate cue type and reason): deferred standing and OOB tasks since pt was feeling fatigued and slightly dizzy sitting at EOB.  Pt  reported feeling more fatigued this week due to missing dialysis once this week.               General ADL Comments: Pt needs cues to compensate by using head turning since most of L eye vision is occluded. She is at risk for falls due to change in visual status and now on O2 nasal cannula at 3L which she was not on at home.      Vision Baseline Vision/History: Wears glasses Wears Glasses: At all times Patient Visual Report: Peripheral vision impairment;Other (comment)(almost complete vision loss in L eye) Vision Assessment?: Yes Eye Alignment: Impaired (comment) Ocular Range of Motion: Within Functional Limits Alignment/Gaze Preference: Within Defined Limits Tracking/Visual Pursuits: Decreased  smoothness of eye movement to LEFT superior field;Left eye does not track medially Saccades: Additional head turns occurred during testing;Impaired - to be further tested in functional context Convergence: Impaired (comment) Visual Fields: Left visual field deficit Additional Comments: When looking straight ahead while upright in bed, patient can see the left bottom edge of television set only.  When someone stands at bedside, she only sees up to the person's neck and has to move head up to see their head and face.      Perception     Praxis      Pertinent Vitals/Pain Pain Assessment: No/denies pain     Hand Dominance Right   Extremity/Trunk Assessment Upper Extremity Assessment Upper Extremity Assessment: Generalized weakness   Lower Extremity Assessment Lower Extremity Assessment: Defer to PT evaluation       Communication Communication Communication: No difficulties   Cognition Arousal/Alertness: Awake/alert Behavior During Therapy: WFL for tasks assessed/performed Overall Cognitive Status: Within Functional Limits for tasks assessed                                     General Comments       Exercises     Shoulder Instructions      Home Living Family/patient expects to be discharged to:: Private residence Living Arrangements: Non-relatives/Friends Available Help at Discharge: Friend(s) Type of Home: House Home Access: Stairs to enter [employer] of Steps: 4 stairs at side entrance swhich is preferred entrance--pt reports going up sideways Entrance Stairs-Rails: Left Home Layout: Two level;Able to live on main level with bedroom/bathroom Alternate Level Stairs-Number of Steps: pt reports her house is 1 1/2 stories but she does not go up to upper half.   Bathroom Shower/Tub: Gaffer;Door   [employer] Toilet: Standard Bathroom Accessibility: Yes   Home Equipment: Cane - single point;Walker - 2 wheels;Shower seat - built in    Additional Comments: pt asking about a rolling walker with built in seat/Rollator      Prior Functioning/Environment Level of Independence: Independent with assistive device(s)        Comments: Pt reports using a cane mainly for ambulation and a walker when going to dialysis 2 days a week        OT Problem List: Impaired vision/perception;Decreased safety awareness;Cardiopulmonary status limiting activity;Decreased activity tolerance      OT Treatment/Interventions: Energy conservation;Visual/perceptual remediation/compensation;Therapeutic activities;Patient/family education    OT Goals(Current goals can be found in the care plan section) Acute Rehab OT Goals Patient Stated Goal: "to get my energy level back so I can take care of myself again" OT Goal Formulation: With patient/family Time For Goal Achievement: 01/31/18 Potential to Achieve Goals: Good ADL Goals Pt Will  Perform Lower Body Dressing: with set-up;with supervision;sit to/from stand Pt Will Transfer to Toilet: with set-up;with supervision;bedside commode Pt/caregiver will Perform Home Exercise Program: Independently;With written HEP provided  OT Frequency: Min 1X/week   Barriers to D/C:            Co-evaluation              AM-PAC PT "6 Clicks" Daily Activity     Outcome Measure Help from another person eating meals?: None Help from another person taking care of personal grooming?: None Help from another person toileting, which includes using toliet, bedpan, or urinal?: A Little Help from another person bathing (including washing, rinsing, drying)?: A Little Help from another person to put on and taking off regular upper body clothing?: None Help from another person to put on and taking off regular lower body clothing?: A Little 6 Click Score: 21   End of Session    Activity Tolerance: Patient limited by fatigue Patient left: in bed;with call bell/phone within reach;with bed alarm set;with  family/visitor present  OT Visit Diagnosis: History of falling (Z91.81);Muscle weakness (generalized) (M62.81);Other symptoms and signs involving the nervous system (R29.898)                Time: 1100-1145 OT Time Calculation (min): 45 min Charges:  OT General Charges $OT Visit: 1 Visit OT Evaluation $OT Eval Low Complexity: 1 Low OT Treatments $Self Care/Home Management : 8-22 mins $Therapeutic Activity: 8-22 mins G-Codes: OT G-codes **NOT FOR INPATIENT CLASS** Functional Assessment Tool Used: AM-PAC 6 Clicks Daily Activity;Clinical judgement   Chrys Racer, OTR/L ascom [phone] 01/17/18, 1:35 PM

## 2018-01-17 NOTE — Progress Notes (Signed)
[facility]  ROUNDING NOTE   Subjective:   Hemodialysis treatment yesterday. Tolerated treatment well. UF of 2.8 liters.   No indication for dialysis today.    Objective:  Vital signs in last 24 hours:  Temp:  [98 F (36.7 C)-98.9 F (37.2 C)] 98.1 F (36.7 C) (02/28 1255) Pulse Rate:  [79-97] 81 (02/28 1255) Resp:  [15-35] 20 (02/28 1255) BP: (115-194)/(48-104) 156/62 (02/28 1255) SpO2:  [88 %-100 %] 100 % (02/28 1255) Weight:  [58.1 kg (128 lb 1.4 oz)-60.9 kg (134 lb 4.2 oz)] 58.1 kg (128 lb 1.4 oz) (02/27 2139)  Weight change:  Filed Weights   01/16/18 1206 01/16/18 1810 01/16/18 2139  Weight: 59.9 kg (132 lb) 60.9 kg (134 lb 4.2 oz) 58.1 kg (128 lb 1.4 oz)    Intake/Output: I/O last 3 completed shifts: In: -  Out: 2906 [Urine:100; Other:2806]   Intake/Output this shift:  Total I/O In: 240 [P.O.:240] Out: -   Physical Exam: General: NAD,   Head: Normocephalic, atraumatic. Moist oral mucosal membranes  Eyes: Right sided blindness  Neck: Supple, trachea midline  Lungs:  Bilateral crackles  Heart: Regular rate and rhythm  Abdomen:  Soft, nontender,   Extremities: no peripheral edema.  Neurologic: Nonfocal, moving all four extremities  Skin: No lesions  Access: Left AVF    Basic Metabolic Panel: Recent Labs  Lab 01/16/18 1223  NA 137  K 3.9  CL 102  CO2 21*  GLUCOSE 115*  BUN 48*  CREATININE 7.28*  CALCIUM 7.4*    Liver Function Tests: Recent Labs  Lab 01/16/18 1223  AST 14*  ALT 8*  ALKPHOS 83  BILITOT 0.7  PROT 6.8  ALBUMIN 3.7   No results for input(s): LIPASE, AMYLASE in the last 168 hours. No results for input(s): AMMONIA in the last 168 hours.  CBC: Recent Labs  Lab 01/16/18 1223  WBC 7.3  NEUTROABS 5.7  HGB 10.0*  HCT 30.2*  MCV 85.6  PLT 173    Cardiac Enzymes: Recent Labs  Lab 01/16/18 1223 01/16/18 1612 01/16/18 2028 01/17/18 0241  TROPONINI 0.03* 0.03* 0.03* 0.03*    BNP: Invalid input(s):  POCBNP  CBG: No results for input(s): GLUCAP in the last 168 hours.  Microbiology: No results found for this or any previous visit.  Coagulation Studies: Recent Labs    01/16/18 1223  LABPROT 14.9  INR 1.18    Urinalysis: Recent Labs    01/16/18 1244  COLORURINE YELLOW*  LABSPEC 1.014  PHURINE 7.0  GLUCOSEU NEGATIVE  HGBUR NEGATIVE  BILIRUBINUR NEGATIVE  KETONESUR NEGATIVE  PROTEINUR 100*  NITRITE NEGATIVE  LEUKOCYTESUR LARGE*      Imaging: Ct Head Wo Contrast  Result Date: 01/16/2018 CLINICAL DATA:  72 year old female with vision loss in the left eye. EXAM: CT HEAD WITHOUT CONTRAST TECHNIQUE: Contiguous axial images were obtained from the base of the skull through the vertex without intravenous contrast. COMPARISON:  None. FINDINGS: Brain: Mild cerebral atrophy. Patchy and confluent areas of decreased attenuation are noted throughout the deep and periventricular white matter of the cerebral hemispheres bilaterally, compatible with chronic microvascular ischemic disease. No evidence of acute infarction, hemorrhage, hydrocephalus, extra-axial collection or mass lesion/mass effect. Vascular: No hyperdense vessel or unexpected calcification. Skull: Normal. Negative for fracture or focal lesion. Sinuses/Orbits: Chronic mucoperiosteal thickening and opacification of much of the left sphenoid sinus, similar to the prior study. Other: None. IMPRESSION: 1. No acute intracranial abnormalities. 2. Mild cerebral atrophy with extensive chronic microvascular ischemic changes  in the cerebral white matter. 3. Chronic left sphenoid sinus disease, similar to the prior study, as above. Electronically Signed   By: Vinnie Langton M.D.   On: 01/16/2018 12:59   Mr Brain Wo Contrast  Result Date: 01/17/2018 CLINICAL DATA:  Dialysis patient. Diabetes. Hypertension. Hyperlipidemia. Acute onset of left eye vision loss. Retinal artery occlusion suspected. EXAM: MRI HEAD WITHOUT CONTRAST MRA HEAD  WITHOUT CONTRAST TECHNIQUE: Multiplanar, multiecho pulse sequences of the brain and surrounding structures were obtained without intravenous contrast. Angiographic images of the head were obtained using MRA technique without contrast. COMPARISON:  CT 01/16/2018 FINDINGS: MRI HEAD FINDINGS Brain: Diffusion imaging does not show any acute or subacute infarction. Chronic small-vessel ischemic changes affect the pons. No focal cerebellar insult. Cerebral hemispheres show generalized atrophy with pronounced chronic small-vessel ischemic changes throughout the white matter. Old small vessel infarctions affect the thalami and basal ganglia. No large vessel territory infarction. No mass lesion, hemorrhage, hydrocephalus or extra-axial collection. Vascular: Major vessels at the base of the brain show flow. Skull and upper cervical spine: Negative Sinuses/Orbits: Opacification of the left division of the sphenoid sinus. Other sinuses are clear. Orbits appear normal. Other: None MRA HEAD FINDINGS Both internal carotid arteries are patent through the skull base and siphon regions. There is atherosclerotic narrowing and irregularity in the carotid siphon regions, worse on the right the left. Stenosis on the right is estimated at 70%. Stenosis on the left is estimated at 50%. Supraclinoid internal carotid arteries are patent. The anterior and middle cerebral vessels are patent. Left carotid artery supplies the left middle cerebral artery territory and both anterior cerebral artery territories. There is a tiny A1 segment on the right, probably congenital. Right MCA appears widely patent. There is some narrowing and irregularity of more distal intracranial branch vessels. Flow is evident in both ophthalmic arteries. Both vertebral arteries are widely patent to the basilar. No basilar stenosis. Posterior circulation branch vessels are patent. More distal PCA branch vessels show some atherosclerotic irregularity. IMPRESSION: No acute  infarction of the brain by MRI. Extensive chronic small-vessel ischemic changes throughout as outlined above. Flow is seen in both ophthalmic arteries. Atherosclerotic disease affecting both carotid siphon regions, right worse than left, with right stenosis estimated at 70% and left stenosis estimated at 50%. Large and medium sized intracranial branch vessels are patent, but show distal vessel atherosclerotic irregularity. Opacification of the left division of the sphenoid sinus. No orbital sequela. Electronically Signed   By: Nelson Chimes M.D.   On: 01/17/2018 13:28   US Carotid Bilateral (at [facility] And [facility] Only)  Result Date: 01/17/2018 CLINICAL DATA:  Visual loss, hypertension hyperlipidemia, diabetes EXAM: BILATERAL CAROTID DUPLEX ULTRASOUND TECHNIQUE: Pearline Cables scale imaging, color Doppler and duplex ultrasound were performed of bilateral carotid and vertebral arteries in the neck. COMPARISON:  None. FINDINGS: Criteria: Quantification of carotid stenosis is based on velocity parameters that correlate the residual internal carotid diameter with NASCET-based stenosis levels, using the diameter of the distal internal carotid lumen as the denominator for stenosis measurement. The following velocity measurements were obtained: RIGHT ICA:  80/12 cm/sec CCA:  83/38 cm/sec SYSTOLIC ICA/CCA RATIO:  0.9 DIASTOLIC ICA/CCA RATIO:  1.2 ECA:  117 cm/sec LEFT ICA:  85/17 cm/sec CCA:  25/05 cm/sec SYSTOLIC ICA/CCA RATIO:  1.1 DIASTOLIC ICA/CCA RATIO:  1.3 ECA:  126 cm/sec RIGHT CAROTID ARTERY: Minor echogenic shadowing plaque formation. No hemodynamically significant right ICA stenosis, velocity elevation, or turbulent flow. Degree of narrowing less than 50%. RIGHT VERTEBRAL  ARTERY:  Antegrade LEFT CAROTID ARTERY: Similar scattered minor echogenic plaque formation. No hemodynamically significant left ICA stenosis, velocity elevation, or turbulent flow. LEFT VERTEBRAL ARTERY:  Antegrade IMPRESSION: Mild bilateral carotid  atherosclerosis. No hemodynamically significant ICA stenosis. Degree of narrowing less than 50% bilaterally by ultrasound criteria. Patent antegrade vertebral flow bilaterally. Electronically Signed   By: Jerilynn Mages.  Shick M.D.   On: 01/17/2018 10:58   Dg Chest Portable 1 View  Result Date: 01/16/2018 CLINICAL DATA:  Chest pain EXAM: PORTABLE CHEST 1 VIEW COMPARISON:  09/21/2017, 09/11/2017, 12/04/2014 FINDINGS: Mild cardiomegaly. Diffuse interstitial opacity, suspect for pulmonary edema. Aortic atherosclerosis. No focal consolidation. No large effusion. No pneumothorax. IMPRESSION: 1. Cardiomegaly. Mild diffuse increased interstitial opacity, suspect acute interstitial edema or inflammation superimposed on chronic changes. 2. No focal pulmonary opacity or pleural effusion is seen. Electronically Signed   By: Donavan Foil M.D.   On: 01/16/2018 14:55   Mr Jodene Nam Head/brain QZ Cm  Result Date: 01/17/2018 CLINICAL DATA:  Dialysis patient. Diabetes. Hypertension. Hyperlipidemia. Acute onset of left eye vision loss. Retinal artery occlusion suspected. EXAM: MRI HEAD WITHOUT CONTRAST MRA HEAD WITHOUT CONTRAST TECHNIQUE: Multiplanar, multiecho pulse sequences of the brain and surrounding structures were obtained without intravenous contrast. Angiographic images of the head were obtained using MRA technique without contrast. COMPARISON:  CT 01/16/2018 FINDINGS: MRI HEAD FINDINGS Brain: Diffusion imaging does not show any acute or subacute infarction. Chronic small-vessel ischemic changes affect the pons. No focal cerebellar insult. Cerebral hemispheres show generalized atrophy with pronounced chronic small-vessel ischemic changes throughout the white matter. Old small vessel infarctions affect the thalami and basal ganglia. No large vessel territory infarction. No mass lesion, hemorrhage, hydrocephalus or extra-axial collection. Vascular: Major vessels at the base of the brain show flow. Skull and upper cervical spine:  Negative Sinuses/Orbits: Opacification of the left division of the sphenoid sinus. Other sinuses are clear. Orbits appear normal. Other: None MRA HEAD FINDINGS Both internal carotid arteries are patent through the skull base and siphon regions. There is atherosclerotic narrowing and irregularity in the carotid siphon regions, worse on the right the left. Stenosis on the right is estimated at 70%. Stenosis on the left is estimated at 50%. Supraclinoid internal carotid arteries are patent. The anterior and middle cerebral vessels are patent. Left carotid artery supplies the left middle cerebral artery territory and both anterior cerebral artery territories. There is a tiny A1 segment on the right, probably congenital. Right MCA appears widely patent. There is some narrowing and irregularity of more distal intracranial branch vessels. Flow is evident in both ophthalmic arteries. Both vertebral arteries are widely patent to the basilar. No basilar stenosis. Posterior circulation branch vessels are patent. More distal PCA branch vessels show some atherosclerotic irregularity. IMPRESSION: No acute infarction of the brain by MRI. Extensive chronic small-vessel ischemic changes throughout as outlined above. Flow is seen in both ophthalmic arteries. Atherosclerotic disease affecting both carotid siphon regions, right worse than left, with right stenosis estimated at 70% and left stenosis estimated at 50%. Large and medium sized intracranial branch vessels are patent, but show distal vessel atherosclerotic irregularity. Opacification of the left division of the sphenoid sinus. No orbital sequela. Electronically Signed   By: Nelson Chimes M.D.   On: 01/17/2018 13:28     Medications:    . amLODipine  10 mg Oral Daily  . aspirin  300 mg Rectal Daily   Or  . aspirin  325 mg Oral Daily  . calcium acetate  667 mg Oral  TID WC  . carvedilol  12.5 mg Oral BID WC  . heparin injection (subcutaneous)  5,000 Units Subcutaneous  Q8H  . ipratropium-albuterol  3 mL Nebulization QID  . loratadine  10 mg Oral Daily  . methylPREDNISolone (SOLU-MEDROL) injection  60 mg Intravenous Q6H  . mometasone-formoterol  2 puff Inhalation BID  . oxybutynin  15 mg Oral Daily  . pantoprazole  40 mg Oral QAC breakfast  . pravastatin  40 mg Oral Daily  . Vitamin D (Ergocalciferol)  50,000 Units Oral Q Wed   acetaminophen **OR** acetaminophen (TYLENOL) oral liquid 160 mg/5 mL **OR** acetaminophen, albuterol, cyclobenzaprine, docusate sodium, HYDROcodone-acetaminophen, nitroGLYCERIN, ondansetron  Assessment/ Plan:  Phyllis [name] is a 72 y.o. white female with end stage renal disease on hemodialysis, hypertension, hyperlipidemia, gout, GERD, diabetes mellitus type II, COPD  TTS [facility]. Left AVF EDW 60.5kg  1. End Stage Renal Disease: seen and examined on hemodialysis. Tolerating treatment well. Missed dialysis yesterday due to infiltration of AV fistula. However running well today.  - Next treatment for Saturday.   2. Hypertension: with concerns for ischemic CVA. MRI with  - amlodipine, carvedilol  - Concern for ischemic CVA. Appreciate neurology input. MRI pending.   3. Anemia of chronic kidney disease: hemoglobin 10. Holding ESA due to ischemic stroke.   4. Secondary Hyperparathyroidism:  - Calcium acetate with meals.    LOS: 1 Phyllis [name] 2/28/20191:34 PM

## 2018-01-17 NOTE — Progress Notes (Signed)
PT Cancellation Note  Patient Details Name: Phyllis [name] MRN: [id number] DOB: October 15, 1946   Cancelled Treatment:    Reason Eval/Treat Not Completed: Patient at procedure or test/unavailable(Consult received and chart reviewed.  Patient currently off unit for diagnostic testing.  Will re-attempt at later time/date as medically appropriate.)   Pershing Skidmore H. Owens Shark, PT, DPT, NCS 01/17/18, 10:14 AM [phone]

## 2018-01-17 NOTE — Progress Notes (Signed)
Patient ID: Phyllis [name], female   DOB: October 30, 1946, 72 y.o.   MRN: [id number]  [facility] PROGRESS NOTE  Phyllis [name] BTD:[id number] DOB: 03-28-1946 DOA: 01/16/2018 PCP: [name], MD  HPI/Subjective: Patient states she can see only light out of her left eye.  States she is always a little bit weak on her left side.  Objective: Vitals:   01/17/18 0507 01/17/18 1255  BP: (!) 164/56 (!) 156/62  Pulse: 86 81  Resp: 18 20  Temp: 98.1 F (36.7 C) 98.1 F (36.7 C)  SpO2: 100% 100%    Filed Weights   01/16/18 1206 01/16/18 1810 01/16/18 2139  Weight: 59.9 kg (132 lb) 60.9 kg (134 lb 4.2 oz) 58.1 kg (128 lb 1.4 oz)    ROS: Review of Systems  Constitutional: Negative for chills and fever.  Eyes: Negative for blurred vision.  Respiratory: Negative for cough and shortness of breath.   Cardiovascular: Negative for chest pain.  Gastrointestinal: Negative for abdominal pain, constipation, diarrhea, nausea and vomiting.  Genitourinary: Negative for dysuria.  Musculoskeletal: Negative for joint pain.  Neurological: Negative for dizziness and headaches.   Exam: Physical Exam  Constitutional: She is oriented to person, place, and time.  HENT:  Nose: No mucosal edema.  Mouth/Throat: No oropharyngeal exudate or posterior oropharyngeal edema.  Eyes: Conjunctivae, EOM and lids are normal. Pupils are equal, round, and reactive to light.  Neck: No JVD present. Carotid bruit is not present. No edema present. No thyroid mass and no thyromegaly present.  Cardiovascular: S1 normal and S2 normal. Exam reveals no gallop.  No murmur heard. Pulses:      Dorsalis pedis pulses are 2+ on the right side, and 2+ on the left side.  Respiratory: No respiratory distress. She has no wheezes. She has no rhonchi. She has no rales.  GI: Soft. Bowel sounds are normal. There is no tenderness.  Musculoskeletal:       Right ankle: She exhibits no swelling.       Left ankle: She exhibits  no swelling.  Lymphadenopathy:    She has no cervical adenopathy.  Neurological: She is alert and oriented to person, place, and time.  Only able to see light out of the left eye.  Skin: Skin is warm. No rash noted. Nails show no clubbing.  Psychiatric: She has a normal mood and affect.      Data Reviewed: Basic Metabolic Panel: Recent Labs  Lab 01/16/18 1223  NA 137  K 3.9  CL 102  CO2 21*  GLUCOSE 115*  BUN 48*  CREATININE 7.28*  CALCIUM 7.4*   Liver Function Tests: Recent Labs  Lab 01/16/18 1223  AST 14*  ALT 8*  ALKPHOS 83  BILITOT 0.7  PROT 6.8  ALBUMIN 3.7   CBC: Recent Labs  Lab 01/16/18 1223  WBC 7.3  NEUTROABS 5.7  HGB 10.0*  HCT 30.2*  MCV 85.6  PLT 173   Cardiac Enzymes: Recent Labs  Lab 01/16/18 1223 01/16/18 1612 01/16/18 2028 01/17/18 0241  TROPONINI 0.03* 0.03* 0.03* 0.03*   BNP (last 3 results) Recent Labs    09/11/17 0736 09/14/17 1956 09/21/17 1632  BNP 664.0* 578.0* 451.0*      Studies: Ct Head Wo Contrast  Result Date: 01/16/2018 CLINICAL DATA:  72 year old female with vision loss in the left eye. EXAM: CT HEAD WITHOUT CONTRAST TECHNIQUE: Contiguous axial images were obtained from the base of the skull through the vertex without intravenous contrast. COMPARISON:  None. FINDINGS: Brain:  Mild cerebral atrophy. Patchy and confluent areas of decreased attenuation are noted throughout the deep and periventricular white matter of the cerebral hemispheres bilaterally, compatible with chronic microvascular ischemic disease. No evidence of acute infarction, hemorrhage, hydrocephalus, extra-axial collection or mass lesion/mass effect. Vascular: No hyperdense vessel or unexpected calcification. Skull: Normal. Negative for fracture or focal lesion. Sinuses/Orbits: Chronic mucoperiosteal thickening and opacification of much of the left sphenoid sinus, similar to the prior study. Other: None. IMPRESSION: 1. No acute intracranial  abnormalities. 2. Mild cerebral atrophy with extensive chronic microvascular ischemic changes in the cerebral white matter. 3. Chronic left sphenoid sinus disease, similar to the prior study, as above. Electronically Signed   By: Vinnie Langton M.D.   On: 01/16/2018 12:59   Mr Brain Wo Contrast  Result Date: 01/17/2018 CLINICAL DATA:  Dialysis patient. Diabetes. Hypertension. Hyperlipidemia. Acute onset of left eye vision loss. Retinal artery occlusion suspected. EXAM: MRI HEAD WITHOUT CONTRAST MRA HEAD WITHOUT CONTRAST TECHNIQUE: Multiplanar, multiecho pulse sequences of the brain and surrounding structures were obtained without intravenous contrast. Angiographic images of the head were obtained using MRA technique without contrast. COMPARISON:  CT 01/16/2018 FINDINGS: MRI HEAD FINDINGS Brain: Diffusion imaging does not show any acute or subacute infarction. Chronic small-vessel ischemic changes affect the pons. No focal cerebellar insult. Cerebral hemispheres show generalized atrophy with pronounced chronic small-vessel ischemic changes throughout the white matter. Old small vessel infarctions affect the thalami and basal ganglia. No large vessel territory infarction. No mass lesion, hemorrhage, hydrocephalus or extra-axial collection. Vascular: Major vessels at the base of the brain show flow. Skull and upper cervical spine: Negative Sinuses/Orbits: Opacification of the left division of the sphenoid sinus. Other sinuses are clear. Orbits appear normal. Other: None MRA HEAD FINDINGS Both internal carotid arteries are patent through the skull base and siphon regions. There is atherosclerotic narrowing and irregularity in the carotid siphon regions, worse on the right the left. Stenosis on the right is estimated at 70%. Stenosis on the left is estimated at 50%. Supraclinoid internal carotid arteries are patent. The anterior and middle cerebral vessels are patent. Left carotid artery supplies the left middle  cerebral artery territory and both anterior cerebral artery territories. There is a tiny A1 segment on the right, probably congenital. Right MCA appears widely patent. There is some narrowing and irregularity of more distal intracranial branch vessels. Flow is evident in both ophthalmic arteries. Both vertebral arteries are widely patent to the basilar. No basilar stenosis. Posterior circulation branch vessels are patent. More distal PCA branch vessels show some atherosclerotic irregularity. IMPRESSION: No acute infarction of the brain by MRI. Extensive chronic small-vessel ischemic changes throughout as outlined above. Flow is seen in both ophthalmic arteries. Atherosclerotic disease affecting both carotid siphon regions, right worse than left, with right stenosis estimated at 70% and left stenosis estimated at 50%. Large and medium sized intracranial branch vessels are patent, but show distal vessel atherosclerotic irregularity. Opacification of the left division of the sphenoid sinus. No orbital sequela. Electronically Signed   By: Nelson Chimes M.D.   On: 01/17/2018 13:28   US Carotid Bilateral (at [facility] And [facility] Only)  Result Date: 01/17/2018 CLINICAL DATA:  Visual loss, hypertension hyperlipidemia, diabetes EXAM: BILATERAL CAROTID DUPLEX ULTRASOUND TECHNIQUE: Pearline Cables scale imaging, color Doppler and duplex ultrasound were performed of bilateral carotid and vertebral arteries in the neck. COMPARISON:  None. FINDINGS: Criteria: Quantification of carotid stenosis is based on velocity parameters that correlate the residual internal carotid diameter with NASCET-based stenosis levels,  using the diameter of the distal internal carotid lumen as the denominator for stenosis measurement. The following velocity measurements were obtained: RIGHT ICA:  80/12 cm/sec CCA:  08/65 cm/sec SYSTOLIC ICA/CCA RATIO:  0.9 DIASTOLIC ICA/CCA RATIO:  1.2 ECA:  117 cm/sec LEFT ICA:  85/17 cm/sec CCA:  78/46 cm/sec SYSTOLIC ICA/CCA RATIO:   1.1 DIASTOLIC ICA/CCA RATIO:  1.3 ECA:  126 cm/sec RIGHT CAROTID ARTERY: Minor echogenic shadowing plaque formation. No hemodynamically significant right ICA stenosis, velocity elevation, or turbulent flow. Degree of narrowing less than 50%. RIGHT VERTEBRAL ARTERY:  Antegrade LEFT CAROTID ARTERY: Similar scattered minor echogenic plaque formation. No hemodynamically significant left ICA stenosis, velocity elevation, or turbulent flow. LEFT VERTEBRAL ARTERY:  Antegrade IMPRESSION: Mild bilateral carotid atherosclerosis. No hemodynamically significant ICA stenosis. Degree of narrowing less than 50% bilaterally by ultrasound criteria. Patent antegrade vertebral flow bilaterally. Electronically Signed   By: Jerilynn Mages.  Shick M.D.   On: 01/17/2018 10:58   Dg Chest Portable 1 View  Result Date: 01/16/2018 CLINICAL DATA:  Chest pain EXAM: PORTABLE CHEST 1 VIEW COMPARISON:  09/21/2017, 09/11/2017, 12/04/2014 FINDINGS: Mild cardiomegaly. Diffuse interstitial opacity, suspect for pulmonary edema. Aortic atherosclerosis. No focal consolidation. No large effusion. No pneumothorax. IMPRESSION: 1. Cardiomegaly. Mild diffuse increased interstitial opacity, suspect acute interstitial edema or inflammation superimposed on chronic changes. 2. No focal pulmonary opacity or pleural effusion is seen. Electronically Signed   By: Donavan Foil M.D.   On: 01/16/2018 14:55   Mr Jodene Nam Head/brain NG Cm  Result Date: 01/17/2018 CLINICAL DATA:  Dialysis patient. Diabetes. Hypertension. Hyperlipidemia. Acute onset of left eye vision loss. Retinal artery occlusion suspected. EXAM: MRI HEAD WITHOUT CONTRAST MRA HEAD WITHOUT CONTRAST TECHNIQUE: Multiplanar, multiecho pulse sequences of the brain and surrounding structures were obtained without intravenous contrast. Angiographic images of the head were obtained using MRA technique without contrast. COMPARISON:  CT 01/16/2018 FINDINGS: MRI HEAD FINDINGS Brain: Diffusion imaging does not show any acute  or subacute infarction. Chronic small-vessel ischemic changes affect the pons. No focal cerebellar insult. Cerebral hemispheres show generalized atrophy with pronounced chronic small-vessel ischemic changes throughout the white matter. Old small vessel infarctions affect the thalami and basal ganglia. No large vessel territory infarction. No mass lesion, hemorrhage, hydrocephalus or extra-axial collection. Vascular: Major vessels at the base of the brain show flow. Skull and upper cervical spine: Negative Sinuses/Orbits: Opacification of the left division of the sphenoid sinus. Other sinuses are clear. Orbits appear normal. Other: None MRA HEAD FINDINGS Both internal carotid arteries are patent through the skull base and siphon regions. There is atherosclerotic narrowing and irregularity in the carotid siphon regions, worse on the right the left. Stenosis on the right is estimated at 70%. Stenosis on the left is estimated at 50%. Supraclinoid internal carotid arteries are patent. The anterior and middle cerebral vessels are patent. Left carotid artery supplies the left middle cerebral artery territory and both anterior cerebral artery territories. There is a tiny A1 segment on the right, probably congenital. Right MCA appears widely patent. There is some narrowing and irregularity of more distal intracranial branch vessels. Flow is evident in both ophthalmic arteries. Both vertebral arteries are widely patent to the basilar. No basilar stenosis. Posterior circulation branch vessels are patent. More distal PCA branch vessels show some atherosclerotic irregularity. IMPRESSION: No acute infarction of the brain by MRI. Extensive chronic small-vessel ischemic changes throughout as outlined above. Flow is seen in both ophthalmic arteries. Atherosclerotic disease affecting both carotid siphon regions, right worse than left, with  right stenosis estimated at 70% and left stenosis estimated at 50%. Large and medium sized  intracranial branch vessels are patent, but show distal vessel atherosclerotic irregularity. Opacification of the left division of the sphenoid sinus. No orbital sequela. Electronically Signed   By: Nelson Chimes M.D.   On: 01/17/2018 13:28    Scheduled Meds: . amLODipine  10 mg Oral Daily  . calcium acetate  667 mg Oral TID WC  . carvedilol  12.5 mg Oral BID WC  . clopidogrel  75 mg Oral Daily  . heparin injection (subcutaneous)  5,000 Units Subcutaneous Q8H  . ipratropium-albuterol  3 mL Nebulization QID  . loratadine  10 mg Oral Daily  . mometasone-formoterol  2 puff Inhalation BID  . oxybutynin  15 mg Oral Daily  . pantoprazole  40 mg Oral QAC breakfast  . pravastatin  40 mg Oral Daily  . Vitamin D (Ergocalciferol)  50,000 Units Oral Q Wed   Continuous Infusions:  Assessment/Plan:  1. 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  Switched to Plavix.  Nervous about switching to a high intensity statin because the patient has issues with Zocor.  Continue pravastatin.  Echocardiogram still pending.  Conflicting results with MRA and sonogram so a CTA ordered.  ESR 60 so less likely temporal arteritis. 2. Fluid overload on admission required stat dialysis last night.  Will have to have dialysis again tomorrow after CTA. 3. End-stage renal disease on dialysis.  Will have dialysis again tomorrow morning. 4. Essential hypertension on amlodipine and Coreg 5. Hyperlipidemia unspecified on pravastatin.  LDL 101 and goal less than 70. 6. GERD on PPI 7. Patient is not a diabetic with hemoglobin A1c of 4.6.  Code Status:     Code Status Orders  (From admission, onward)        Start     Ordered   01/16/18 1631  Full code  Continuous     01/16/18 1630    Code Status History    Date Active Date Inactive Code Status Order ID Comments User Context   01/16/2018 13:40 01/16/2018 16:30 DNR [id number]  Dustin Flock, MD ED   09/18/2017 13:48 09/19/2017 15:13 DNR [id number]  Loletha Grayer, MD ED   04/12/2015 10:56  04/12/2015 16:31 Full Code [id number]  Algernon Huxley, MD Inpatient     Family Communication: Permission to speak in front friend Disposition Plan: Likely home tomorrow after dialysis  Consultants:  Neurology  Nephrology  Time spent: 25 minutes  [place]

## 2018-01-17 NOTE — Progress Notes (Signed)
SLP Cancellation Note  Patient Details Name: MIGNONNE AFONSO MRN: [id number] DOB: 10/20/1946   Cancelled treatment:       Reason Eval/Treat Not Completed: Patient at procedure or test/unavailable(chart reviewed; ST will f/u when pt is available next. ) NSG consulted.     Orinda Kenner, MS, CCC-SLP Watson,Katherine 01/17/2018, 12:41 PM

## 2018-01-17 NOTE — Progress Notes (Signed)
OT Cancellation Note  Patient Details Name: VALARY MANAHAN MRN: [id number] DOB: 12-08-1945   Cancelled Treatment:    Reason Eval/Treat Not Completed: Patient at procedure or test/ unavailable Order received and chart reviewed.  Pt at medical test and spoke to [place], patient's friend who was in room. Will attempt evaluation again later today.    Chrys Racer, OTR/L ascom [phone] 01/17/18, 9:54 AM

## 2018-01-17 NOTE — Progress Notes (Signed)
*  PRELIMINARY RESULTS* Echocardiogram 2D Echocardiogram has been performed.  Phyllis [name] 01/17/2018, 2:22 PM

## 2018-01-17 NOTE — Consult Note (Signed)
Referring Physician: Leslye Peer    Chief Complaint: Left eye vision loss  HPI: Phyllis [name] is an 72 y.o. female with multiple medical problems who awakened on yesterday unable to see out of the left eye.  Was seen by her ophthalmologist who felt the patient had a CRAO.  Was sent to the ED for further evaluation.  Initial NIHSS of 1  Date last known well: Date: 01/16/2018 Time last known well: Time: 03:00 tPA Given: No: Outside time window  Past Medical History:  Diagnosis Date  . Allergy   . Anemia of chronic disease   . Anxiety   . Aortic atherosclerosis ([place])   . Asthma   . Chronic back pain   . COPD (chronic obstructive pulmonary disease) ([place])   . Diabetes mellitus with complication ([place])   . ESRD on hemodialysis ([place])    a. Tues/Sat; b. 2/2 small kidneys  . Essential hypertension   . Fistula    lower left arm  . GERD (gastroesophageal reflux disease)   . Gout   . History of echocardiogram    a. TTE 01/2014: nl LV sys fxn, no valvular abnormalities; b. TTE 11/16: nl EF, mild LVH  . History of exercise stress test    a. 01/2014: no evidence of ischemia; b. Lexiscan 08/2015: no sig ischemia, severe GI uptake artifact, low risk; c. CPET @ [facility] 09/2016: exercised 3 min 12 sec on bike without incline, 2.28 METs, VO2 of 8.1, 48% of predicted, indicating mod to sev functional impairment, evidence of blunted HR, stroke volume, and BP augmentation as well as ventilation-perfusion mismatch with exercise  . HLD (hyperlipidemia)   . 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 Renal insufficiency 09/24/2017   Dialysis patient.  . Sleep apnea     Past Surgical History:  Procedure Laterality Date  . carpel tunnel    . GALLBLADDER SURGERY    . PERIPHERAL VASCULAR CATHETERIZATION N/A 04/12/2015   Procedure: A/V Shuntogram/Fistulagram;  Surgeon: Algernon Huxley, MD;  Location: [facility];  Service: Cardiovascular;  Laterality: N/A;  . PERIPHERAL VASCULAR  CATHETERIZATION N/A 04/12/2015   Procedure: A/V Shunt Intervention;  Surgeon: Algernon Huxley, MD;  Location: [facility];  Service: Cardiovascular;  Laterality: N/A;  . PERIPHERAL VASCULAR CATHETERIZATION N/A 06/09/2015   Procedure: Dialysis/Perma Catheter Removal;  Surgeon: Katha Cabal, MD;  Location: [facility];  Service: Cardiovascular;  Laterality: N/A;    Family History  Problem Relation Age of Onset  . Hypertension Mother   . Hyperlipidemia Mother   . Heart disease Father   . Heart attack Father 62  . Hypertension Father   . Hyperlipidemia Father   . Heart disease Brother        CABG   . Heart attack Brother   . Breast cancer Neg Hx    Social History:  reports that  has never smoked. she has never used smokeless tobacco. She reports that she does not drink alcohol or use drugs.  Allergies:  Allergies  Allergen Reactions  . Codeine Nausea And Vomiting and Nausea Only  . Enalapril Maleate     Other reaction(s): Headache  . Nitrofurantoin Swelling and Rash    Other Reaction: swelling of body  . Sulfamethoxazole-Trimethoprim Swelling  . Vicodin [Hydrocodone-Acetaminophen] Nausea And Vomiting and Nausea Only  . 2,4-D Dimethylamine (Amisol) Rash    Other Reaction: h/a  . Baclofen Other (See Comments) and Nausea Only  lightheadness ,drowsiness , muscle weakness , twitching in hands   . Neosporin [Neomycin-Bacitracin Zn-Polymyx] Other (See Comments) and Rash    Other Reaction: irritation Skin irritation   . Quinine Rash    Other Reaction: Vomiting rash, h/a, vision  . Ultram [Tramadol] Palpitations  . Zocor [Simvastatin] Other (See Comments) and Rash    Other Reaction: muscle spasms Muscle pain and spasms  . Bactrim [Sulfamethoxazole-Trimethoprim] Swelling  . Levodopa   . Macrodantin [Nitrofurantoin Macrocrystal] Swelling  . Quinine Derivatives Other (See Comments)    Vertigo,nausea vomiting blurred vision headache ears sensitivity   . Vasotec  [Enalapril] Other (See Comments)    Headaches     Medications:  I have reviewed the patient's current medications. Prior to Admission:  Medications Prior to Admission  Medication Sig Dispense Refill Last Dose  . albuterol (PROVENTIL HFA;VENTOLIN HFA) 108 (90 Base) MCG/ACT inhaler Inhale 2 puffs into the lungs every 6 (six) hours as needed for wheezing or shortness of breath.   PRN at PRN  . amLODipine (NORVASC) 10 MG tablet Take 10 mg by mouth.    01/16/2018 at AM  . aspirin EC 81 MG EC tablet Take 1 tablet (81 mg total) by mouth daily.   01/16/2018 at AM  . calcium acetate (PHOSLO) 667 MG capsule Take 667 mg by mouth 3 (three) times daily with meals.    01/16/2018 at AM  . carvedilol (COREG) 12.5 MG tablet Take 12.5 mg by mouth 2 (two) times daily with a meal.    01/16/2018 at AM  . cetirizine (ZYRTEC) 10 MG tablet Take 10 mg by mouth as needed for allergies.   PRN at PRN  . cyclobenzaprine (FLEXERIL) 10 MG tablet Take 10 mg by mouth 3 (three) times daily as needed for muscle spasms.   PRN at PRN  . docusate sodium (COLACE) 100 MG capsule Take 100 mg by mouth 3 (three) times daily as needed for mild constipation.   PRN at PRN  . Fluticasone-Salmeterol (ADVAIR) 250-50 MCG/DOSE AEPB Inhale 1 puff into the lungs 2 (two) times daily.   01/16/2018 at AM  . HYDROcodone-acetaminophen (NORCO) 5-325 MG tablet Take 1 tablet by mouth every 6 (six) hours as needed for moderate pain or severe pain. 10 tablet 0 PRN at PRN  . lidocaine-prilocaine (EMLA) cream Apply 1 application topically as needed.   UTD at UTD  . nitroGLYCERIN (NITROSTAT) 0.4 MG SL tablet Place 1 tablet (0.4 mg total) under the tongue every 5 (five) minutes as needed for chest pain. 25 tablet 1 PRN at PRN  . omeprazole (PRILOSEC) 20 MG capsule Take 20 mg by mouth 2 (two) times daily before a meal.   01/16/2018 at AM  . ondansetron (ZOFRAN) 4 MG tablet Take 4 mg by mouth every 8 (eight) hours as needed for nausea or vomiting.   PRN at PRN  .  oxybutynin (DITROPAN XL) 15 MG 24 hr tablet Take 15 mg by mouth daily.   01/16/2018 at AM  . pravastatin (PRAVACHOL) 40 MG tablet Take 40 mg by mouth daily.    01/15/2018 at PM  . senna (SENOKOT) 8.6 MG tablet Take 1 tablet by mouth daily.   PRN at PRN  . topiramate (TOPAMAX) 25 MG tablet Take 25 mg by mouth as needed.    PRN at PRN  . Vitamin D, Ergocalciferol, (DRISDOL) 50000 UNITS CAPS capsule Take 50,000 Units by mouth every Wednesday.    01/09/2018 at AM   Scheduled: . amLODipine  10 mg Oral  Daily  . aspirin  300 mg Rectal Daily   Or  . aspirin  325 mg Oral Daily  . calcium acetate  667 mg Oral TID WC  . carvedilol  12.5 mg Oral BID WC  . heparin injection (subcutaneous)  5,000 Units Subcutaneous Q8H  . ipratropium-albuterol  3 mL Nebulization QID  . loratadine  10 mg Oral Daily  . methylPREDNISolone (SOLU-MEDROL) injection  60 mg Intravenous Q6H  . mometasone-formoterol  2 puff Inhalation BID  . oxybutynin  15 mg Oral Daily  . pantoprazole  40 mg Oral QAC breakfast  . pravastatin  40 mg Oral Daily  . Vitamin D (Ergocalciferol)  50,000 Units Oral Q Wed    ROS: History obtained from the patient  General ROS: negative for - chills, fatigue, fever, night sweats, weight gain or weight loss Psychological ROS: negative for - behavioral disorder, hallucinations, memory difficulties, mood swings or suicidal ideation Ophthalmic ROS: negative for - blurry vision, double vision, eye pain or loss of vision ENT ROS: negative for - epistaxis, nasal discharge, oral lesions, sore throat, tinnitus or vertigo Allergy and Immunology ROS: negative for - hives or itchy/watery eyes Hematological and Lymphatic ROS: negative for - bleeding problems, bruising or swollen lymph nodes Endocrine ROS: negative for - galactorrhea, hair pattern changes, polydipsia/polyuria or temperature intolerance Respiratory ROS: negative for - cough, hemoptysis, shortness of breath or wheezing Cardiovascular ROS: negative  for - chest pain, dyspnea on exertion, edema or irregular heartbeat Gastrointestinal ROS: negative for - abdominal pain, diarrhea, hematemesis, nausea/vomiting or stool incontinence Genito-Urinary ROS: negative for - dysuria, hematuria, incontinence or urinary frequency/urgency Musculoskeletal ROS: negative for - joint swelling or muscular weakness Neurological ROS: as noted in HPI Dermatological ROS: negative for rash and skin lesion changes  Physical Examination: Blood pressure (!) 156/62, pulse 81, temperature 98.1 F (36.7 C), temperature source Oral, resp. rate 20, height 4\' 10"  (1.473 m), weight 58.1 kg (128 lb 1.4 oz), SpO2 100 %.  HEENT-  Normocephalic, no lesions, without obvious abnormality.  Normal external eye and conjunctiva.  Normal TM's bilaterally.  Normal auditory canals and external ears. Normal external nose, mucus membranes and septum.  Normal pharynx. Cardiovascular- S1, S2 normal, pulses palpable throughout   Lungs- chest clear, no wheezing, rales, normal symmetric air entry Abdomen- soft, non-tender; bowel sounds normal; no masses,  no organomegaly Extremities- no edema Lymph-no adenopathy palpable Musculoskeletal-no joint tenderness, deformity or swelling Skin-warm and dry, no hyperpigmentation, vitiligo, or suspicious lesions  Neurological Examination   Mental Status: Alert, oriented, thought content appropriate.  Speech fluent without evidence of aphasia.  Able to follow 3 step commands without difficulty. Cranial Nerves: II: Discs flat bilaterally; Unable to count fingers with the left eye, vision intact with the right eye, right pupil reactive, left pupil sluggish III,IV, VI: ptosis not present, extra-ocular motions intact bilaterally V,VII: smile symmetric, facial light touch sensation normal bilaterally VIII: hearing normal bilaterally IX,X: gag reflex present XI: bilateral shoulder shrug XII: midline tongue extension Motor: Right : Upper extremity    5/5    Left:     Upper extremity   5/5  Lower extremity   5/5     Lower extremity   5/5 Tone and bulk:normal tone throughout; no atrophy noted Sensory: Pinprick and light touch intact throughout, bilaterally Deep Tendon Reflexes: 2+ and symmetric with absent AJ's bilaterally Plantars: Right: downgoing   Left: downgoing Cerebellar: Normal finger-to-nose and normal heel-to-shin testing bilaterally Gait: not tested due to safety concerns  Laboratory Studies:  Basic Metabolic Panel: Recent Labs  Lab 01/16/18 1223  NA 137  K 3.9  CL 102  CO2 21*  GLUCOSE 115*  BUN 48*  CREATININE 7.28*  CALCIUM 7.4*    Liver Function Tests: Recent Labs  Lab 01/16/18 1223  AST 14*  ALT 8*  ALKPHOS 83  BILITOT 0.7  PROT 6.8  ALBUMIN 3.7   No results for input(s): LIPASE, AMYLASE in the last 168 hours. No results for input(s): AMMONIA in the last 168 hours.  CBC: Recent Labs  Lab 01/16/18 1223  WBC 7.3  NEUTROABS 5.7  HGB 10.0*  HCT 30.2*  MCV 85.6  PLT 173    Cardiac Enzymes: Recent Labs  Lab 01/16/18 1223 01/16/18 1612 01/16/18 2028 01/17/18 0241  TROPONINI 0.03* 0.03* 0.03* 0.03*    BNP: Invalid input(s): POCBNP  CBG: No results for input(s): GLUCAP in the last 168 hours.  Microbiology: No results found for this or any previous visit.  Coagulation Studies: Recent Labs    01/16/18 1223  LABPROT 14.9  INR 1.18    Urinalysis:  Recent Labs  Lab 01/16/18 1244  COLORURINE YELLOW*  LABSPEC 1.014  PHURINE 7.0  GLUCOSEU NEGATIVE  HGBUR NEGATIVE  BILIRUBINUR NEGATIVE  KETONESUR NEGATIVE  PROTEINUR 100*  NITRITE NEGATIVE  LEUKOCYTESUR LARGE*    Lipid Panel:    Component Value Date/Time   CHOL 180 01/17/2018 0241   TRIG 154 (H) 01/17/2018 0241   HDL 48 01/17/2018 0241   CHOLHDL 3.8 01/17/2018 0241   VLDL 31 01/17/2018 0241   LDLCALC 101 (H) 01/17/2018 0241    HgbA1C:  Lab Results  Component Value Date   HGBA1C 4.6 (L) 01/17/2018     Urine Drug Screen:      Component Value Date/Time   LABOPIA POSITIVE (A) 01/16/2018 1244   COCAINSCRNUR NONE DETECTED 01/16/2018 1244   LABBENZ NONE DETECTED 01/16/2018 1244   AMPHETMU NONE DETECTED 01/16/2018 1244   THCU NONE DETECTED 01/16/2018 1244   LABBARB NONE DETECTED 01/16/2018 1244    Alcohol Level:  Recent Labs  Lab 01/16/18 1233  ETH <10    Other results: EKG: sinus tachycardia at 115 bpm.  Imaging: Ct Head Wo Contrast  Result Date: 01/16/2018 CLINICAL DATA:  72 year old female with vision loss in the left eye. EXAM: CT HEAD WITHOUT CONTRAST TECHNIQUE: Contiguous axial images were obtained from the base of the skull through the vertex without intravenous contrast. COMPARISON:  None. FINDINGS: Brain: Mild cerebral atrophy. Patchy and confluent areas of decreased attenuation are noted throughout the deep and periventricular white matter of the cerebral hemispheres bilaterally, compatible with chronic microvascular ischemic disease. No evidence of acute infarction, hemorrhage, hydrocephalus, extra-axial collection or mass lesion/mass effect. Vascular: No hyperdense vessel or unexpected calcification. Skull: Normal. Negative for fracture or focal lesion. Sinuses/Orbits: Chronic mucoperiosteal thickening and opacification of much of the left sphenoid sinus, similar to the prior study. Other: None. IMPRESSION: 1. No acute intracranial abnormalities. 2. Mild cerebral atrophy with extensive chronic microvascular ischemic changes in the cerebral white matter. 3. Chronic left sphenoid sinus disease, similar to the prior study, as above. Electronically Signed   By: Vinnie Langton M.D.   On: 01/16/2018 12:59   Mr Brain Wo Contrast  Result Date: 01/17/2018 CLINICAL DATA:  Dialysis patient. Diabetes. Hypertension. Hyperlipidemia. Acute onset of left eye vision loss. Retinal artery occlusion suspected. EXAM: MRI HEAD WITHOUT CONTRAST MRA HEAD WITHOUT CONTRAST TECHNIQUE: Multiplanar,  multiecho pulse sequences of the brain and surrounding structures  were obtained without intravenous contrast. Angiographic images of the head were obtained using MRA technique without contrast. COMPARISON:  CT 01/16/2018 FINDINGS: MRI HEAD FINDINGS Brain: Diffusion imaging does not show any acute or subacute infarction. Chronic small-vessel ischemic changes affect the pons. No focal cerebellar insult. Cerebral hemispheres show generalized atrophy with pronounced chronic small-vessel ischemic changes throughout the white matter. Old small vessel infarctions affect the thalami and basal ganglia. No large vessel territory infarction. No mass lesion, hemorrhage, hydrocephalus or extra-axial collection. Vascular: Major vessels at the base of the brain show flow. Skull and upper cervical spine: Negative Sinuses/Orbits: Opacification of the left division of the sphenoid sinus. Other sinuses are clear. Orbits appear normal. Other: None MRA HEAD FINDINGS Both internal carotid arteries are patent through the skull base and siphon regions. There is atherosclerotic narrowing and irregularity in the carotid siphon regions, worse on the right the left. Stenosis on the right is estimated at 70%. Stenosis on the left is estimated at 50%. Supraclinoid internal carotid arteries are patent. The anterior and middle cerebral vessels are patent. Left carotid artery supplies the left middle cerebral artery territory and both anterior cerebral artery territories. There is a tiny A1 segment on the right, probably congenital. Right MCA appears widely patent. There is some narrowing and irregularity of more distal intracranial branch vessels. Flow is evident in both ophthalmic arteries. Both vertebral arteries are widely patent to the basilar. No basilar stenosis. Posterior circulation branch vessels are patent. More distal PCA branch vessels show some atherosclerotic irregularity. IMPRESSION: No acute infarction of the brain by MRI.  Extensive chronic small-vessel ischemic changes throughout as outlined above. Flow is seen in both ophthalmic arteries. Atherosclerotic disease affecting both carotid siphon regions, right worse than left, with right stenosis estimated at 70% and left stenosis estimated at 50%. Large and medium sized intracranial branch vessels are patent, but show distal vessel atherosclerotic irregularity. Opacification of the left division of the sphenoid sinus. No orbital sequela. Electronically Signed   By: Nelson Chimes M.D.   On: 01/17/2018 13:28   US Carotid Bilateral (at [facility] And Ap Only)  Result Date: 01/17/2018 CLINICAL DATA:  Visual loss, hypertension hyperlipidemia, diabetes EXAM: BILATERAL CAROTID DUPLEX ULTRASOUND TECHNIQUE: Pearline Cables scale imaging, color Doppler and duplex ultrasound were performed of bilateral carotid and vertebral arteries in the neck. COMPARISON:  None. FINDINGS: Criteria: Quantification of carotid stenosis is based on velocity parameters that correlate the residual internal carotid diameter with NASCET-based stenosis levels, using the diameter of the distal internal carotid lumen as the denominator for stenosis measurement. The following velocity measurements were obtained: RIGHT ICA:  80/12 cm/sec CCA:  14/43 cm/sec SYSTOLIC ICA/CCA RATIO:  0.9 DIASTOLIC ICA/CCA RATIO:  1.2 ECA:  117 cm/sec LEFT ICA:  85/17 cm/sec CCA:  15/40 cm/sec SYSTOLIC ICA/CCA RATIO:  1.1 DIASTOLIC ICA/CCA RATIO:  1.3 ECA:  126 cm/sec RIGHT CAROTID ARTERY: Minor echogenic shadowing plaque formation. No hemodynamically significant right ICA stenosis, velocity elevation, or turbulent flow. Degree of narrowing less than 50%. RIGHT VERTEBRAL ARTERY:  Antegrade LEFT CAROTID ARTERY: Similar scattered minor echogenic plaque formation. No hemodynamically significant left ICA stenosis, velocity elevation, or turbulent flow. LEFT VERTEBRAL ARTERY:  Antegrade IMPRESSION: Mild bilateral carotid atherosclerosis. No hemodynamically  significant ICA stenosis. Degree of narrowing less than 50% bilaterally by ultrasound criteria. Patent antegrade vertebral flow bilaterally. Electronically Signed   By: Jerilynn Mages.  Shick M.D.   On: 01/17/2018 10:58   Dg Chest Portable 1 View  Result Date: 01/16/2018 CLINICAL DATA:  Chest pain EXAM: PORTABLE CHEST 1 VIEW COMPARISON:  09/21/2017, 09/11/2017, 12/04/2014 FINDINGS: Mild cardiomegaly. Diffuse interstitial opacity, suspect for pulmonary edema. Aortic atherosclerosis. No focal consolidation. No large effusion. No pneumothorax. IMPRESSION: 1. Cardiomegaly. Mild diffuse increased interstitial opacity, suspect acute interstitial edema or inflammation superimposed on chronic changes. 2. No focal pulmonary opacity or pleural effusion is seen. Electronically Signed   By: Donavan Foil M.D.   On: 01/16/2018 14:55   Mr Jodene Nam Head/brain LK Cm  Result Date: 01/17/2018 CLINICAL DATA:  Dialysis patient. Diabetes. Hypertension. Hyperlipidemia. Acute onset of left eye vision loss. Retinal artery occlusion suspected. EXAM: MRI HEAD WITHOUT CONTRAST MRA HEAD WITHOUT CONTRAST TECHNIQUE: Multiplanar, multiecho pulse sequences of the brain and surrounding structures were obtained without intravenous contrast. Angiographic images of the head were obtained using MRA technique without contrast. COMPARISON:  CT 01/16/2018 FINDINGS: MRI HEAD FINDINGS Brain: Diffusion imaging does not show any acute or subacute infarction. Chronic small-vessel ischemic changes affect the pons. No focal cerebellar insult. Cerebral hemispheres show generalized atrophy with pronounced chronic small-vessel ischemic changes throughout the white matter. Old small vessel infarctions affect the thalami and basal ganglia. No large vessel territory infarction. No mass lesion, hemorrhage, hydrocephalus or extra-axial collection. Vascular: Major vessels at the base of the brain show flow. Skull and upper cervical spine: Negative Sinuses/Orbits: Opacification of  the left division of the sphenoid sinus. Other sinuses are clear. Orbits appear normal. Other: None MRA HEAD FINDINGS Both internal carotid arteries are patent through the skull base and siphon regions. There is atherosclerotic narrowing and irregularity in the carotid siphon regions, worse on the right the left. Stenosis on the right is estimated at 70%. Stenosis on the left is estimated at 50%. Supraclinoid internal carotid arteries are patent. The anterior and middle cerebral vessels are patent. Left carotid artery supplies the left middle cerebral artery territory and both anterior cerebral artery territories. There is a tiny A1 segment on the right, probably congenital. Right MCA appears widely patent. There is some narrowing and irregularity of more distal intracranial branch vessels. Flow is evident in both ophthalmic arteries. Both vertebral arteries are widely patent to the basilar. No basilar stenosis. Posterior circulation branch vessels are patent. More distal PCA branch vessels show some atherosclerotic irregularity. IMPRESSION: No acute infarction of the brain by MRI. Extensive chronic small-vessel ischemic changes throughout as outlined above. Flow is seen in both ophthalmic arteries. Atherosclerotic disease affecting both carotid siphon regions, right worse than left, with right stenosis estimated at 70% and left stenosis estimated at 50%. Large and medium sized intracranial branch vessels are patent, but show distal vessel atherosclerotic irregularity. Opacification of the left division of the sphenoid sinus. No orbital sequela. Electronically Signed   By: Nelson Chimes M.D.   On: 01/17/2018 13:28    Assessment: 72 y.o. female presenting with a left CRAO.  Has loss of vision in the left eye.  Concern is for embolic etiology.  On ASA and a statin prior to admission.  MRI of the brain reviewed and shows no acute changes but evidence of small vessel ischemic changes.  MRA shows 70% right carotid  stenosis and 50% left   Ophthalmic arteries are patent.  Echocardiogram is pending.  Carotid dopplers show no evidence of hemodynamically significant stenosis.  A1c 4.6, LDL 101.  Stroke Risk Factors - diabetes mellitus, hyperlipidemia and hypertension  Plan: 1. PT consult, OT consult, Speech consult 2. Prophylactic therapy-Antiplatelet med: Plavix - dose 75mg  daily 3. NPO until RN stroke swallow  screen 4. Telemetry monitoring 5. Frequent neuro checks 6. MRA and carotid dopplers have results that are conflicting.  Would perform CTA of the neck.  Patient already on dialysis. 7. Aggressive lipid management with target LDL<70.    Alexis Goodell, MD Neurology [phone] 01/17/2018, 1:34 PM

## 2018-01-17 NOTE — Progress Notes (Signed)
SLP Cancellation Note  Patient Details Name: BEENA CATANO MRN: [id number] DOB: 1946/09/26   Cancelled treatment:       Reason Eval/Treat Not Completed: SLP screened, no needs identified, will sign off(chart reviewed; consulted NSG then met w/ pt/family). Pt denied any difficulty swallowing and is currently on a regular diet eating her dinner meal; tolerates swallowing pills w/ water per NSG. Pt conversed at conversational level w/out deficits noted; pt and family denied any speech-language deficits.  No further skilled ST services indicated as pt appears at her baseline. Pt agreed. NSG to reconsult if any change in status.    Orinda Kenner, MS, CCC-SLP Watson,Katherine 01/17/2018, 5:51 PM

## 2018-01-17 NOTE — Evaluation (Signed)
Physical Therapy Evaluation Patient Details Name: Phyllis [name] MRN: [id number] DOB: 1946-10-13 Today's Date: 01/17/2018   History of Present Illness  Phyllis [name]  is a 72 y.o. female with a known history of end-stage renal disease on hemodialysis, COPD, chronic back pain, diabetes type 2, essential hypertension, GERD, gout, hyperlipidemia and sleep apnea but does not use a CPAP machine who woke up 01-16-18 with complete left eye vision loss.  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  Therefore she was referred to the ED.  The ER physician has spoken to the neurologist who recommends stroke workup.  Patient besides having difficulty with left-sided vision loss has no other complaints.  Clinical Impression  Upon evaluation, patient alert and oriented; follows all commands and demonstrates good effort/insight into deficits.  Mild weakness noted L LE > UE (4-/5), but no significant sensory or coordination deficits noted.  Does continue to endorse significant L visual deficits/loss, but reports slight improvement (able to see far L latearal visual field) since admission.  Able to complete bed mobility with mod indep; sit/stand, basic transfers and gait (210') with RW, close sup.  Slow, but steady, cadence without buckling or LOB.  Mildly excessive weight shift to L LE, but corrects appropriately as needed.  Do recommend continued use of RW for all mobility for optimal safety; patient voiced agreement. Educated on use of visual scanning techniques to optimize awareness of environment/surroundings; patient voiced understanding and demonstrated good integration throughout session. Would benefit from skilled PT to address above deficits and promote optimal return to PLOF; recommend transition home with outpatient PT follow up as appropriate.    Follow Up Recommendations Outpatient PT    Equipment  Recommendations  (youth RW)    Recommendations for Other Services       Precautions / Restrictions Precautions Precautions: Fall Precaution Comments: On 3L of nasal cannula O2 Restrictions Weight Bearing Restrictions: No      Mobility  Bed Mobility Overal bed mobility: Modified Independent                Transfers Overall transfer level: Needs assistance   Transfers: Sit to/from Stand Sit to Stand: Supervision         General transfer comment: mild use of UEs to assist with lift off  Ambulation/Gait Ambulation/Gait assistance: Supervision Ambulation Distance (Feet): 210 Feet Assistive device: Rolling walker (2 wheeled)       General Gait Details: reciprocal stepping pattern with good step height/length; slow, but steady, cadence without buckling or LOB.  Mildly excessive weight shift to L LE, but corrects appropriately as needed.  Stairs            Wheelchair Mobility    Modified Rankin (Stroke Patients Only)       Balance Overall balance assessment: Needs assistance Sitting-balance support: No upper extremity supported;Feet supported Sitting balance-Leahy Scale: Good     Standing balance support: Bilateral upper extremity supported Standing balance-Leahy Scale: Fair                               Pertinent Vitals/Pain Pain Assessment: No/denies pain    Home Living Family/patient expects to be discharged to:: Private residence Living Arrangements: Non-relatives/Friends Available Help at Discharge: Friend(s) Type of Home: House Home Access: Stairs to enter Entrance Stairs-Rails: Left Entrance Stairs-Number of Steps: 4 stairs at side entrance swhich is  preferred entrance--pt reports going up sideways Home Layout: Two level;Able to live on main level with bedroom/bathroom Home Equipment: Kasandra Knudsen - single point;Walker - 2 wheels;Shower seat - built in Additional Comments: pt asking about a rolling walker with built in  seat/Rollator    Prior Function Level of Independence: Independent with assistive device(s)         Comments: Mod indep with RW inside the home, [facility] for longer-community distances (when needing to transport device in car).  Drives self to/from dialysis. Does endorse 2-3 falls within previous six months.     Hand Dominance   Dominant Hand: Right    Extremity/Trunk Assessment   Upper Extremity Assessment Upper Extremity Assessment: Overall WFL for tasks assessed    Lower Extremity Assessment Lower Extremity Assessment: Overall WFL for tasks assessed(R LE grossly 4+/5, L LE grossly 4-/5.  Denies sensory deficit or paresthesia; coordnation grossly WFL and symmetrical)       Communication   Communication: No difficulties  Cognition Arousal/Alertness: Awake/alert Behavior During Therapy: WFL for tasks assessed/performed Overall Cognitive Status: Within Functional Limits for tasks assessed                                        General Comments      Exercises Other Exercises Other Exercises: Educated on visual scanning techniques ("lighthouse technique") during mobility tasks, esepcially in unfamiliar environments to ensure visual awareness and safety with all mobility efforts.  Patient voiced understanding; demonstrates good integration with gait trials.   Assessment/Plan    PT Assessment Patient needs continued PT services  PT Problem List Decreased strength;Decreased balance;Decreased mobility;Decreased coordination       PT Treatment Interventions DME instruction;Gait training;Stair training;Functional mobility training;Therapeutic activities;Therapeutic exercise;Balance training;Neuromuscular re-education;Patient/family education    PT Goals (Current goals can be found in the Care Plan section)  Acute Rehab PT Goals Patient Stated Goal: "to get my energy level back so I can take care of myself again" PT Goal Formulation: With patient Time For Goal  Achievement: 01/31/18 Potential to Achieve Goals: Good Additional Goals Additional Goal #1: (P) Assess and establish goals for objective balance measure next session    Frequency BID   Barriers to discharge        Co-evaluation               AM-PAC PT "6 Clicks" Daily Activity  Outcome Measure Difficulty turning over in bed (including adjusting bedclothes, sheets and blankets)?: None Difficulty moving from lying on back to sitting on the side of the bed? : None Difficulty sitting down on and standing up from a chair with arms (e.g., wheelchair, bedside commode, etc,.)?: A Little Help needed moving to and from a bed to chair (including a wheelchair)?: A Little Help needed walking in hospital room?: A Little Help needed climbing 3-5 steps with a railing? : A Little 6 Click Score: 20    End of Session Equipment Utilized During Treatment: Gait belt Activity Tolerance: Patient tolerated treatment well Patient left: in chair;with call bell/phone within reach;with chair alarm set;with family/visitor present Nurse Communication: Mobility status PT Visit Diagnosis: Muscle weakness (generalized) (M62.81);Hemiplegia and hemiparesis Hemiplegia - Right/Left: Left Hemiplegia - dominant/non-dominant: Non-dominant Hemiplegia - caused by: Cerebral infarction    Time: 1510-1530 PT Time Calculation (min) (ACUTE ONLY): 20 min   Charges:   PT Evaluation $PT Eval Moderate Complexity: 1 Mod PT Treatments $Therapeutic Activity: 8-22 mins  PT G Codes:       Kalah Pflum H. Owens Shark, PT, DPT, NCS 01/17/18, 3:51 PM [phone]

## 2018-01-18 LAB — RENAL FUNCTION PANEL
Albumin: 3.6 g/dL (ref 3.5–5.0)
Anion gap: 16 — ABNORMAL HIGH (ref 5–15)
BUN: 43 mg/dL — ABNORMAL HIGH (ref 6–20)
CO2: 26 mmol/L (ref 22–32)
Calcium: 8.2 mg/dL — ABNORMAL LOW (ref 8.9–10.3)
Chloride: 99 mmol/L — ABNORMAL LOW (ref 101–111)
Creatinine, Ser: 5.3 mg/dL — ABNORMAL HIGH (ref 0.44–1.00)
GFR calc Af Amer: 9 mL/min — ABNORMAL LOW (ref >60)
GFR calc non Af Amer: 7 mL/min — ABNORMAL LOW (ref >60)
Glucose, Bld: 153 mg/dL — ABNORMAL HIGH (ref 65–99)
Phosphorus: 5.2 mg/dL — ABNORMAL HIGH (ref 2.5–4.6)
Potassium: 3.9 mmol/L (ref 3.5–5.1)
Sodium: 141 mmol/L (ref 135–145)

## 2018-01-18 LAB — CBC
HCT: 27.7 % — ABNORMAL LOW (ref 35.0–47.0)
Hemoglobin: 9.2 g/dL — ABNORMAL LOW (ref 12.0–16.0)
MCH: 28.7 pg (ref 26.0–34.0)
MCHC: 33.3 g/dL (ref 32.0–36.0)
MCV: 86.2 fL (ref 80.0–100.0)
Platelets: 168 10*3/uL (ref 150–440)
RBC: 3.21 MIL/uL — ABNORMAL LOW (ref 3.80–5.20)
RDW: 15.4 % — ABNORMAL HIGH (ref 11.5–14.5)
WBC: 9.8 10*3/uL (ref 3.6–11.0)

## 2018-01-18 LAB — GLUCOSE, CAPILLARY: Glucose-Capillary: 91 mg/dL (ref 65–99)

## 2018-01-18 MED ORDER — CLOPIDOGREL BISULFATE 75 MG PO TABS: 75.0000 mg | ORAL_TABLET | Freq: Every day | ORAL | 0 refills | Status: DC

## 2018-01-18 NOTE — Progress Notes (Signed)
[facility]  ROUNDING NOTE   Subjective:   Received IV contrast yesterday. Requiring hemodialysis today.   Seen and examined on hemodialysis. Tolerating treatment well. UF goal of 2.5 liters.     HEMODIALYSIS FLOWSHEET:  Blood Flow Rate (mL/min): 400 mL/min Arterial Pressure (mmHg): -170 mmHg Venous Pressure (mmHg): 150 mmHg Transmembrane Pressure (mmHg): 60 mmHg Ultrafiltration Rate (mL/min): 1000 mL/min Dialysate Flow Rate (mL/min): 600 ml/min Conductivity: Machine : 14.1 Conductivity: Machine : 14.1 Dialysis Fluid Bolus: Normal Saline Bolus Amount (mL): 250 mL     Objective:  Vital signs in last 24 hours:  Temp:  [98.1 F (36.7 C)-98.6 F (37 C)] 98.6 F (37 C) (03/01 1030) Pulse Rate:  [73-87] 73 (03/01 1115) Resp:  [15-20] 20 (03/01 1115) BP: (134-156)/(50-62) 151/58 (03/01 1115) SpO2:  [94 %-100 %] 99 % (03/01 1115) Weight:  [58.1 kg (128 lb 1.4 oz)] 58.1 kg (128 lb 1.4 oz) (03/01 1030)  Weight change:  Filed Weights   01/16/18 1810 01/16/18 2139 01/18/18 1030  Weight: 60.9 kg (134 lb 4.2 oz) 58.1 kg (128 lb 1.4 oz) 58.1 kg (128 lb 1.4 oz)    Intake/Output: I/O last 3 completed shifts: In: 600 [P.O.:600] Out: 2906 [Urine:100; Other:2806]   Intake/Output this shift:  Total I/O In: 240 [P.O.:240] Out: -   Physical Exam: General: NAD,   Head: Normocephalic, atraumatic. Moist oral mucosal membranes  Eyes: Left sided blindness  Neck: Supple, trachea midline  Lungs:  Bilateral crackles  Heart: Regular rate and rhythm  Abdomen:  Soft, nontender,   Extremities: no peripheral edema.  Neurologic: Nonfocal, moving all four extremities  Skin: No lesions  Access: Left AVF    Basic Metabolic Panel: Recent Labs  Lab 01/16/18 1223  NA 137  K 3.9  CL 102  CO2 21*  GLUCOSE 115*  BUN 48*  CREATININE 7.28*  CALCIUM 7.4*    Liver Function Tests: Recent Labs  Lab 01/16/18 1223  AST 14*  ALT 8*  ALKPHOS 83  BILITOT 0.7  PROT 6.8   ALBUMIN 3.7   No results for input(s): LIPASE, AMYLASE in the last 168 hours. No results for input(s): AMMONIA in the last 168 hours.  CBC: Recent Labs  Lab 01/16/18 1223  WBC 7.3  NEUTROABS 5.7  HGB 10.0*  HCT 30.2*  MCV 85.6  PLT 173    Cardiac Enzymes: Recent Labs  Lab 01/16/18 1223 01/16/18 1612 01/16/18 2028 01/17/18 0241  TROPONINI 0.03* 0.03* 0.03* 0.03*    BNP: Invalid input(s): POCBNP  CBG: No results for input(s): GLUCAP in the last 168 hours.  Microbiology: No results found for this or any previous visit.  Coagulation Studies: Recent Labs    01/16/18 1223  LABPROT 14.9  INR 1.18    Urinalysis: Recent Labs    01/16/18 1244  COLORURINE YELLOW*  LABSPEC 1.014  PHURINE 7.0  GLUCOSEU NEGATIVE  HGBUR NEGATIVE  BILIRUBINUR NEGATIVE  KETONESUR NEGATIVE  PROTEINUR 100*  NITRITE NEGATIVE  LEUKOCYTESUR LARGE*      Imaging: Ct Head Wo Contrast  Result Date: 01/16/2018 CLINICAL DATA:  72 year old female with vision loss in the left eye. EXAM: CT HEAD WITHOUT CONTRAST TECHNIQUE: Contiguous axial images were obtained from the base of the skull through the vertex without intravenous contrast. COMPARISON:  None. FINDINGS: Brain: Mild cerebral atrophy. Patchy and confluent areas of decreased attenuation are noted throughout the deep and periventricular white matter of the cerebral hemispheres bilaterally, compatible with chronic microvascular ischemic disease. No evidence of acute  infarction, hemorrhage, hydrocephalus, extra-axial collection or mass lesion/mass effect. Vascular: No hyperdense vessel or unexpected calcification. Skull: Normal. Negative for fracture or focal lesion. Sinuses/Orbits: Chronic mucoperiosteal thickening and opacification of much of the left sphenoid sinus, similar to the prior study. Other: None. IMPRESSION: 1. No acute intracranial abnormalities. 2. Mild cerebral atrophy with extensive chronic microvascular ischemic changes in the  cerebral white matter. 3. Chronic left sphenoid sinus disease, similar to the prior study, as above. Electronically Signed   By: Vinnie Langton M.D.   On: 01/16/2018 12:59   Ct Angio Neck W Or Wo Contrast  Result Date: 01/17/2018 CLINICAL DATA:  Follow-up stroke. LEFT vision loss. History of end-stage renal disease on dialysis. EXAM: CT ANGIOGRAPHY NECK TECHNIQUE: Multidetector CT imaging of the neck was performed using the standard protocol during bolus administration of intravenous contrast. Multiplanar CT image reconstructions and MIPs were obtained to evaluate the vascular anatomy. Carotid stenosis measurements (when applicable) are obtained utilizing NASCET criteria, using the distal internal carotid diameter as the denominator. CONTRAST:  34mL ISOVUE-370 IOPAMIDOL (ISOVUE-370) INJECTION 76% COMPARISON:  MRI/MRA head January 17, 2018 and carotid ultrasound January 17, 2018. FINDINGS: AORTIC ARCH: Normal appearance of the thoracic arch, normal branch pattern. Moderate calcific atherosclerosis aortic arch. The origins of the innominate, left Common carotid artery and subclavian artery are widely patent. RIGHT CAROTID SYSTEM: Common carotid artery is widely patent, mild intimal thickening and calcific atherosclerosis. Asymmetrically smaller RIGHT carotid artery. Calcific atherosclerosis resulting in less than 50% stenosis by NASCET criteria. LEFT CAROTID SYSTEM: Common carotid artery is widely patent, mild calcific atherosclerosis. Calcific atherosclerosis resulting in less than 50% stenosis by NASCET criteria. VERTEBRAL ARTERIES:Calcific atherosclerosis resulting in moderate stenosis RIGHT vertebral artery origin. LEFT vertebral artery is dominant. Bilateral vertebral arteries are patent. SKELETON: No acute osseous process though bone windows have not been submitted. OTHER NECK: Soft tissues of the neck are nonacute though, not tailored for evaluation. Multiple thyroid nodules measuring to 17 mm on the  LEFT. Asymmetric fullness LEFT base of tongue. UPPER CHEST: Small bilateral pleural effusions. Pulmonary vascular congestion and heterogeneous lung attenuation seen with small airway disease/pulmonary edema. IMPRESSION: 1. Atherosclerosis resulting in less than 50% stenosis bilateral internal carotid arteries. 2. Moderate stenosis RIGHT vertebral artery origin. Patent vertebral arteries. 3. Asymmetric fullness LEFT base of tongue, recommend direct inspection. 4. **An incidental finding of potential clinical significance has been found. Thyroid nodules measuring to 17 mm. Recommend thyroid sonogram on nonemergent basis. This follows ACR consensus guidelines: Managing Incidental Thyroid Nodules Detected on Imaging: White Paper of the ACR Incidental Thyroid Findings Committee. J Am Coll Radiol 2015; 12:143-150.** Electronically Signed   By: Elon Alas M.D.   On: 01/17/2018 15:14   Mr Brain Wo Contrast  Result Date: 01/17/2018 CLINICAL DATA:  Dialysis patient. Diabetes. Hypertension. Hyperlipidemia. Acute onset of left eye vision loss. Retinal artery occlusion suspected. EXAM: MRI HEAD WITHOUT CONTRAST MRA HEAD WITHOUT CONTRAST TECHNIQUE: Multiplanar, multiecho pulse sequences of the brain and surrounding structures were obtained without intravenous contrast. Angiographic images of the head were obtained using MRA technique without contrast. COMPARISON:  CT 01/16/2018 FINDINGS: MRI HEAD FINDINGS Brain: Diffusion imaging does not show any acute or subacute infarction. Chronic small-vessel ischemic changes affect the pons. No focal cerebellar insult. Cerebral hemispheres show generalized atrophy with pronounced chronic small-vessel ischemic changes throughout the white matter. Old small vessel infarctions affect the thalami and basal ganglia. No large vessel territory infarction. No mass lesion, hemorrhage, hydrocephalus or extra-axial collection. Vascular: Major vessels  at the base of the brain show flow.  Skull and upper cervical spine: Negative Sinuses/Orbits: Opacification of the left division of the sphenoid sinus. Other sinuses are clear. Orbits appear normal. Other: None MRA HEAD FINDINGS Both internal carotid arteries are patent through the skull base and siphon regions. There is atherosclerotic narrowing and irregularity in the carotid siphon regions, worse on the right the left. Stenosis on the right is estimated at 70%. Stenosis on the left is estimated at 50%. Supraclinoid internal carotid arteries are patent. The anterior and middle cerebral vessels are patent. Left carotid artery supplies the left middle cerebral artery territory and both anterior cerebral artery territories. There is a tiny A1 segment on the right, probably congenital. Right MCA appears widely patent. There is some narrowing and irregularity of more distal intracranial branch vessels. Flow is evident in both ophthalmic arteries. Both vertebral arteries are widely patent to the basilar. No basilar stenosis. Posterior circulation branch vessels are patent. More distal PCA branch vessels show some atherosclerotic irregularity. IMPRESSION: No acute infarction of the brain by MRI. Extensive chronic small-vessel ischemic changes throughout as outlined above. Flow is seen in both ophthalmic arteries. Atherosclerotic disease affecting both carotid siphon regions, right worse than left, with right stenosis estimated at 70% and left stenosis estimated at 50%. Large and medium sized intracranial branch vessels are patent, but show distal vessel atherosclerotic irregularity. Opacification of the left division of the sphenoid sinus. No orbital sequela. Electronically Signed   By: Nelson Chimes M.D.   On: 01/17/2018 13:28   US Carotid Bilateral (at [facility] And Ap Only)  Result Date: 01/17/2018 CLINICAL DATA:  Visual loss, hypertension hyperlipidemia, diabetes EXAM: BILATERAL CAROTID DUPLEX ULTRASOUND TECHNIQUE: Pearline Cables scale imaging, color Doppler and  duplex ultrasound were performed of bilateral carotid and vertebral arteries in the neck. COMPARISON:  None. FINDINGS: Criteria: Quantification of carotid stenosis is based on velocity parameters that correlate the residual internal carotid diameter with NASCET-based stenosis levels, using the diameter of the distal internal carotid lumen as the denominator for stenosis measurement. The following velocity measurements were obtained: RIGHT ICA:  80/12 cm/sec CCA:  62/13 cm/sec SYSTOLIC ICA/CCA RATIO:  0.9 DIASTOLIC ICA/CCA RATIO:  1.2 ECA:  117 cm/sec LEFT ICA:  85/17 cm/sec CCA:  08/65 cm/sec SYSTOLIC ICA/CCA RATIO:  1.1 DIASTOLIC ICA/CCA RATIO:  1.3 ECA:  126 cm/sec RIGHT CAROTID ARTERY: Minor echogenic shadowing plaque formation. No hemodynamically significant right ICA stenosis, velocity elevation, or turbulent flow. Degree of narrowing less than 50%. RIGHT VERTEBRAL ARTERY:  Antegrade LEFT CAROTID ARTERY: Similar scattered minor echogenic plaque formation. No hemodynamically significant left ICA stenosis, velocity elevation, or turbulent flow. LEFT VERTEBRAL ARTERY:  Antegrade IMPRESSION: Mild bilateral carotid atherosclerosis. No hemodynamically significant ICA stenosis. Degree of narrowing less than 50% bilaterally by ultrasound criteria. Patent antegrade vertebral flow bilaterally. Electronically Signed   By: Jerilynn Mages.  Shick M.D.   On: 01/17/2018 10:58   Dg Chest Portable 1 View  Result Date: 01/16/2018 CLINICAL DATA:  Chest pain EXAM: PORTABLE CHEST 1 VIEW COMPARISON:  09/21/2017, 09/11/2017, 12/04/2014 FINDINGS: Mild cardiomegaly. Diffuse interstitial opacity, suspect for pulmonary edema. Aortic atherosclerosis. No focal consolidation. No large effusion. No pneumothorax. IMPRESSION: 1. Cardiomegaly. Mild diffuse increased interstitial opacity, suspect acute interstitial edema or inflammation superimposed on chronic changes. 2. No focal pulmonary opacity or pleural effusion is seen. Electronically Signed   By:  Donavan Foil M.D.   On: 01/16/2018 14:55   Mr Jodene Nam Head/brain HQ Cm  Result Date: 01/17/2018 CLINICAL DATA:  Dialysis patient. Diabetes. Hypertension. Hyperlipidemia. Acute onset of left eye vision loss. Retinal artery occlusion suspected. EXAM: MRI HEAD WITHOUT CONTRAST MRA HEAD WITHOUT CONTRAST TECHNIQUE: Multiplanar, multiecho pulse sequences of the brain and surrounding structures were obtained without intravenous contrast. Angiographic images of the head were obtained using MRA technique without contrast. COMPARISON:  CT 01/16/2018 FINDINGS: MRI HEAD FINDINGS Brain: Diffusion imaging does not show any acute or subacute infarction. Chronic small-vessel ischemic changes affect the pons. No focal cerebellar insult. Cerebral hemispheres show generalized atrophy with pronounced chronic small-vessel ischemic changes throughout the white matter. Old small vessel infarctions affect the thalami and basal ganglia. No large vessel territory infarction. No mass lesion, hemorrhage, hydrocephalus or extra-axial collection. Vascular: Major vessels at the base of the brain show flow. Skull and upper cervical spine: Negative Sinuses/Orbits: Opacification of the left division of the sphenoid sinus. Other sinuses are clear. Orbits appear normal. Other: None MRA HEAD FINDINGS Both internal carotid arteries are patent through the skull base and siphon regions. There is atherosclerotic narrowing and irregularity in the carotid siphon regions, worse on the right the left. Stenosis on the right is estimated at 70%. Stenosis on the left is estimated at 50%. Supraclinoid internal carotid arteries are patent. The anterior and middle cerebral vessels are patent. Left carotid artery supplies the left middle cerebral artery territory and both anterior cerebral artery territories. There is a tiny A1 segment on the right, probably congenital. Right MCA appears widely patent. There is some narrowing and irregularity of more distal  intracranial branch vessels. Flow is evident in both ophthalmic arteries. Both vertebral arteries are widely patent to the basilar. No basilar stenosis. Posterior circulation branch vessels are patent. More distal PCA branch vessels show some atherosclerotic irregularity. IMPRESSION: No acute infarction of the brain by MRI. Extensive chronic small-vessel ischemic changes throughout as outlined above. Flow is seen in both ophthalmic arteries. Atherosclerotic disease affecting both carotid siphon regions, right worse than left, with right stenosis estimated at 70% and left stenosis estimated at 50%. Large and medium sized intracranial branch vessels are patent, but show distal vessel atherosclerotic irregularity. Opacification of the left division of the sphenoid sinus. No orbital sequela. Electronically Signed   By: Nelson Chimes M.D.   On: 01/17/2018 13:28     Medications:    . amLODipine  10 mg Oral Daily  . calcium acetate  667 mg Oral TID WC  . carvedilol  12.5 mg Oral BID WC  . clopidogrel  75 mg Oral Daily  . heparin injection (subcutaneous)  5,000 Units Subcutaneous Q8H  . ipratropium-albuterol  3 mL Nebulization QID  . loratadine  10 mg Oral Daily  . mometasone-formoterol  2 puff Inhalation BID  . oxybutynin  15 mg Oral Daily  . pantoprazole  40 mg Oral QAC breakfast  . pravastatin  40 mg Oral Daily  . Vitamin D (Ergocalciferol)  50,000 Units Oral Q Wed   acetaminophen **OR** acetaminophen (TYLENOL) oral liquid 160 mg/5 mL **OR** acetaminophen, albuterol, cyclobenzaprine, docusate sodium, HYDROcodone-acetaminophen, nitroGLYCERIN, ondansetron  Assessment/ Plan:  [name] is a 72 y.o. white female with end stage renal disease on hemodialysis, hypertension, hyperlipidemia, gout, GERD, diabetes mellitus type II, COPD  TTS [place]. Left AVF EDW 60.5kg  1. End Stage Renal Disease: seen and examined on hemodialysis. Tolerating treatment well.  - Continue TTS  schedule after today.   2. Hypertension: with concerns for ischemic CVA.   - amlodipine, carvedilol  - Concern for  ischemic CVA. Appreciate neurology input.   3. Anemia of chronic kidney disease: hemoglobin 10. Holding ESA due to ischemic stroke.   4. Secondary Hyperparathyroidism:  - Calcium acetate with meals.    LOS: 2 Phyllis [name] 3/1/201911:24 AM

## 2018-01-18 NOTE — Discharge Summary (Signed)
Phyllis [name], is a 72 y.o. female  DOB October 24, 1946  MRN [id number].  Admission date:  01/16/2018  Admitting Physician  Dustin Flock, MD  Discharge Date:  01/18/2018   Primary MD  [name], MD  Recommendations for primary care physician for things to follow:   Follow-up with PCP in 1 week   Admission Diagnosis  Central retinal artery occlusion of left eye [H34.12] CVA (cerebral vascular accident) [facility]) [I63.9]   Discharge Diagnosis  Central retinal artery occlusion of left eye [H34.12] CVA (cerebral vascular accident) ([place]) [I63.9]    Active Problems:   CVA (cerebral vascular accident) ([place])   Acute respiratory failure ([place])      Past Medical History:  Diagnosis Date  . Allergy   . Anemia of chronic disease   . Anxiety   . Aortic atherosclerosis ([place])   . Asthma   . Chronic back pain   . COPD (chronic obstructive pulmonary disease) ([place])   . Diabetes mellitus with complication ([place])   . ESRD on hemodialysis ([place])    a. Tues/Sat; b. 2/2 small kidneys  . Essential hypertension   . Fistula    lower left arm  . GERD (gastroesophageal reflux disease)   . Gout   . History of echocardiogram    a. TTE 01/2014: nl LV sys fxn, no valvular abnormalities; b. TTE 11/16: nl EF, mild LVH  . History of exercise stress test    a. 01/2014: no evidence of ischemia; b. Lexiscan 08/2015: no sig ischemia, severe GI uptake artifact, low risk; c. CPET @ [facility] 09/2016: exercised 3 min 12 sec on bike without incline, 2.28 METs, VO2 of 8.1, 48% of predicted, indicating mod to sev functional impairment, evidence of blunted HR, stroke volume, and BP augmentation as well as ventilation-perfusion mismatch with exercise  . HLD (hyperlipidemia)   . 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 Renal insufficiency  09/24/2017   Dialysis patient.  . Sleep apnea     Past Surgical History:  Procedure Laterality Date  . carpel tunnel    . GALLBLADDER SURGERY    . PERIPHERAL VASCULAR CATHETERIZATION N/A 04/12/2015   Procedure: A/V Shuntogram/Fistulagram;  Surgeon: Algernon Huxley, MD;  Location: [place] [facility];  Service: Cardiovascular;  Laterality: N/A;  . PERIPHERAL VASCULAR CATHETERIZATION N/A 04/12/2015   Procedure: A/V Shunt Intervention;  Surgeon: Algernon Huxley, MD;  Location: [place] [facility];  Service: Cardiovascular;  Laterality: N/A;  . PERIPHERAL VASCULAR CATHETERIZATION N/A 06/09/2015   Procedure: Dialysis/Perma Catheter Removal;  Surgeon: Katha Cabal, MD;  Location: [place] [facility];  Service: Cardiovascular;  Laterality: N/A;       History of present illness and  Hospital Course:     Kindly see H&P for history of present illness and admission details, please review complete Labs, Consult reports and Test reports for all details in brief  HPI  from the history and physical done on the day of admission 72 year old female patient admitted for sudden loss of vision in the left eye on 2/27.  Admitted for evaluation of stroke.   Hospital Course  #1. complete left eye vision loss secondary to acute retinal artery occlusion due to stroke: Admitted to stroke unit, MRI of the brain did not show acute stroke.  Initial stroke scale of 1.  seenby Dr. Doy Mince from neurology.  Patient MRA of carotids showed 70% stenosis on the right, 50% stenosis on the left side.  Patient  ophthalmic arteries are patent.  CT Angie of carotids showed less than 50% stenosis bilaterally.  Patient started on dual antiplatelet therapy, Plavix 75 mg added in addition to baby aspirin that she takes.  Advised the patient to continue aspirin, Plavix, pravastatin.  Physical therapy recommended outpatient physical therapy, youth rolling walker.  2/;ESRD on hemodialysis: Patient gets hemodialysis on Tuesday, Thursday,  Saturday.  But she got CT angiogram just yesterday so she is going to have extra hemodialysis today and after the dialysis patient can be discharged home. 3 essential hypertension: Controlled 4.  COPD: No wheezing. 5.  Diabetes mellitus type 2: Continue home medicines 6.  GERD: Continue PPIs 7.  Chronic back pain issues: Continue pain medicines as she is doing before.    Discharge Condition: Stable   Follow UP Follow-up with PCP in 1 week     Discharge Instructions  and  Discharge Medications   Out pt physical therapy   Allergies as of 01/18/2018      Reactions   Codeine Nausea And Vomiting, Nausea Only   Enalapril Maleate    Other reaction(s): Headache   Nitrofurantoin Swelling, Rash   Other Reaction: swelling of body   Sulfamethoxazole-trimethoprim Swelling   Vicodin [hydrocodone-acetaminophen] Nausea And Vomiting, Nausea Only   2,4-d Dimethylamine (amisol) Rash   Other Reaction: h/a   Baclofen Other (See Comments), Nausea Only   lightheadness ,drowsiness , muscle weakness , twitching in hands    Neosporin [neomycin-bacitracin Zn-polymyx] Other (See Comments), Rash   Other Reaction: irritation Skin irritation   Quinine Rash   Other Reaction: Vomiting rash, h/a, vision   Ultram [tramadol] Palpitations   Zocor [simvastatin] Other (See Comments), Rash   Other Reaction: muscle spasms Muscle pain and spasms   Bactrim [sulfamethoxazole-trimethoprim] Swelling   Levodopa    Macrodantin [nitrofurantoin Macrocrystal] Swelling   Quinine Derivatives Other (See Comments)   Vertigo,nausea vomiting blurred vision headache ears sensitivity   Vasotec [enalapril] Other (See Comments)   Headaches      Medication List    TAKE these medications   albuterol 108 (90 Base) MCG/ACT inhaler Commonly known as:  PROVENTIL HFA;VENTOLIN HFA Inhale 2 puffs into the lungs every 6 (six) hours as needed for wheezing or shortness of breath.   amLODipine 10 MG tablet Commonly known as:   NORVASC Take 10 mg by mouth.   aspirin 81 MG EC tablet Take 1 tablet (81 mg total) by mouth daily.   calcium acetate 667 MG capsule Commonly known as:  PHOSLO Take 667 mg by mouth 3 (three) times daily with meals.   carvedilol 12.5 MG tablet Commonly known as:  COREG Take 12.5 mg by mouth 2 (two) times daily with a meal.   cetirizine 10 MG tablet Commonly known as:  ZYRTEC Take 10 mg by mouth as needed for allergies.   clopidogrel 75 MG tablet Commonly known as:  PLAVIX Take 1 tablet (75 mg total) by mouth daily.   cyclobenzaprine 10 MG tablet Commonly known as:  FLEXERIL Take 10 mg by mouth 3 (three) times daily as needed for muscle spasms.   docusate sodium 100 MG capsule Commonly known as:  COLACE Take 100 mg by mouth 3 (three) times daily as needed for mild constipation.   Fluticasone-Salmeterol 250-50 MCG/DOSE Aepb Commonly known as:  ADVAIR Inhale 1 puff into the lungs 2 (two) times daily.   HYDROcodone-acetaminophen 5-325 MG tablet Commonly known as:  NORCO Take 1 tablet by mouth every 6 (six) hours as needed for moderate  pain or severe pain.   lidocaine-prilocaine cream Commonly known as:  EMLA Apply 1 application topically as needed.   nitroGLYCERIN 0.4 MG SL tablet Commonly known as:  NITROSTAT Place 1 tablet (0.4 mg total) under the tongue every 5 (five) minutes as needed for chest pain.   omeprazole 20 MG capsule Commonly known as:  PRILOSEC Take 20 mg by mouth 2 (two) times daily before a meal.   ondansetron 4 MG tablet Commonly known as:  ZOFRAN Take 4 mg by mouth every 8 (eight) hours as needed for nausea or vomiting.   oxybutynin 15 MG 24 hr tablet Commonly known as:  DITROPAN XL Take 15 mg by mouth daily.   pravastatin 40 MG tablet Commonly known as:  PRAVACHOL Take 40 mg by mouth daily.   senna 8.6 MG tablet Commonly known as:  SENOKOT Take 1 tablet by mouth daily.   topiramate 25 MG tablet Commonly known as:  TOPAMAX Take 25 mg by  mouth as needed.   Vitamin D (Ergocalciferol) 50000 units Caps capsule Commonly known as:  DRISDOL Take 50,000 Units by mouth every Wednesday.         Diet and Activity recommendation: See Discharge Instructions above   Consults obtained -neurology,nephrology   Major procedures and Radiology Reports - PLEASE review detailed and final reports for all details, in brief -     Ct Head Wo Contrast  Result Date: 01/16/2018 CLINICAL DATA:  72 year old female with vision loss in the left eye. EXAM: CT HEAD WITHOUT CONTRAST TECHNIQUE: Contiguous axial images were obtained from the base of the skull through the vertex without intravenous contrast. COMPARISON:  None. FINDINGS: Brain: Mild cerebral atrophy. Patchy and confluent areas of decreased attenuation are noted throughout the deep and periventricular white matter of the cerebral hemispheres bilaterally, compatible with chronic microvascular ischemic disease. No evidence of acute infarction, hemorrhage, hydrocephalus, extra-axial collection or mass lesion/mass effect. Vascular: No hyperdense vessel or unexpected calcification. Skull: Normal. Negative for fracture or focal lesion. Sinuses/Orbits: Chronic mucoperiosteal thickening and opacification of much of the left sphenoid sinus, similar to the prior study. Other: None. IMPRESSION: 1. No acute intracranial abnormalities. 2. Mild cerebral atrophy with extensive chronic microvascular ischemic changes in the cerebral white matter. 3. Chronic left sphenoid sinus disease, similar to the prior study, as above. Electronically Signed   By: Vinnie Langton M.D.   On: 01/16/2018 12:59   Ct Angio Neck W Or Wo Contrast  Result Date: 01/17/2018 CLINICAL DATA:  Follow-up stroke. LEFT vision loss. History of end-stage renal disease on dialysis. EXAM: CT ANGIOGRAPHY NECK TECHNIQUE: Multidetector CT imaging of the neck was performed using the standard protocol during bolus administration of intravenous  contrast. Multiplanar CT image reconstructions and MIPs were obtained to evaluate the vascular anatomy. Carotid stenosis measurements (when applicable) are obtained utilizing NASCET criteria, using the distal internal carotid diameter as the denominator. CONTRAST:  66mL ISOVUE-370 IOPAMIDOL (ISOVUE-370) INJECTION 76% COMPARISON:  MRI/MRA head January 17, 2018 and carotid ultrasound January 17, 2018. FINDINGS: AORTIC ARCH: Normal appearance of the thoracic arch, normal branch pattern. Moderate calcific atherosclerosis aortic arch. The origins of the innominate, left Common carotid artery and subclavian artery are widely patent. RIGHT CAROTID SYSTEM: Common carotid artery is widely patent, mild intimal thickening and calcific atherosclerosis. Asymmetrically smaller RIGHT carotid artery. Calcific atherosclerosis resulting in less than 50% stenosis by NASCET criteria. LEFT CAROTID SYSTEM: Common carotid artery is widely patent, mild calcific atherosclerosis. Calcific atherosclerosis resulting in less than 50% stenosis by  NASCET criteria. VERTEBRAL ARTERIES:Calcific atherosclerosis resulting in moderate stenosis RIGHT vertebral artery origin. LEFT vertebral artery is dominant. Bilateral vertebral arteries are patent. SKELETON: No acute osseous process though bone windows have not been submitted. OTHER NECK: Soft tissues of the neck are nonacute though, not tailored for evaluation. Multiple thyroid nodules measuring to 17 mm on the LEFT. Asymmetric fullness LEFT base of tongue. UPPER CHEST: Small bilateral pleural effusions. Pulmonary vascular congestion and heterogeneous lung attenuation seen with small airway disease/pulmonary edema. IMPRESSION: 1. Atherosclerosis resulting in less than 50% stenosis bilateral internal carotid arteries. 2. Moderate stenosis RIGHT vertebral artery origin. Patent vertebral arteries. 3. Asymmetric fullness LEFT base of tongue, recommend direct inspection. 4. **An incidental finding of  potential clinical significance has been found. Thyroid nodules measuring to 17 mm. Recommend thyroid sonogram on nonemergent basis. This follows ACR consensus guidelines: Managing Incidental Thyroid Nodules Detected on Imaging: White Paper of the ACR Incidental Thyroid Findings Committee. J Am Coll Radiol 2015; 12:143-150.** Electronically Signed   By: Elon Alas M.D.   On: 01/17/2018 15:14   Mr Brain Wo Contrast  Result Date: 01/17/2018 CLINICAL DATA:  Dialysis patient. Diabetes. Hypertension. Hyperlipidemia. Acute onset of left eye vision loss. Retinal artery occlusion suspected. EXAM: MRI HEAD WITHOUT CONTRAST MRA HEAD WITHOUT CONTRAST TECHNIQUE: Multiplanar, multiecho pulse sequences of the brain and surrounding structures were obtained without intravenous contrast. Angiographic images of the head were obtained using MRA technique without contrast. COMPARISON:  CT 01/16/2018 FINDINGS: MRI HEAD FINDINGS Brain: Diffusion imaging does not show any acute or subacute infarction. Chronic small-vessel ischemic changes affect the pons. No focal cerebellar insult. Cerebral hemispheres show generalized atrophy with pronounced chronic small-vessel ischemic changes throughout the white matter. Old small vessel infarctions affect the thalami and basal ganglia. No large vessel territory infarction. No mass lesion, hemorrhage, hydrocephalus or extra-axial collection. Vascular: Major vessels at the base of the brain show flow. Skull and upper cervical spine: Negative Sinuses/Orbits: Opacification of the left division of the sphenoid sinus. Other sinuses are clear. Orbits appear normal. Other: None MRA HEAD FINDINGS Both internal carotid arteries are patent through the skull base and siphon regions. There is atherosclerotic narrowing and irregularity in the carotid siphon regions, worse on the right the left. Stenosis on the right is estimated at 70%. Stenosis on the left is estimated at 50%. Supraclinoid internal  carotid arteries are patent. The anterior and middle cerebral vessels are patent. Left carotid artery supplies the left middle cerebral artery territory and both anterior cerebral artery territories. There is a tiny A1 segment on the right, probably congenital. Right MCA appears widely patent. There is some narrowing and irregularity of more distal intracranial branch vessels. Flow is evident in both ophthalmic arteries. Both vertebral arteries are widely patent to the basilar. No basilar stenosis. Posterior circulation branch vessels are patent. More distal PCA branch vessels show some atherosclerotic irregularity. IMPRESSION: No acute infarction of the brain by MRI. Extensive chronic small-vessel ischemic changes throughout as outlined above. Flow is seen in both ophthalmic arteries. Atherosclerotic disease affecting both carotid siphon regions, right worse than left, with right stenosis estimated at 70% and left stenosis estimated at 50%. Large and medium sized intracranial branch vessels are patent, but show distal vessel atherosclerotic irregularity. Opacification of the left division of the sphenoid sinus. No orbital sequela. Electronically Signed   By: Nelson Chimes M.D.   On: 01/17/2018 13:28   US Carotid Bilateral (at [facility] And [facility] Only)  Result Date: 01/17/2018 CLINICAL DATA:  Visual loss, hypertension hyperlipidemia, diabetes EXAM: BILATERAL CAROTID DUPLEX ULTRASOUND TECHNIQUE: Pearline Cables scale imaging, color Doppler and duplex ultrasound were performed of bilateral carotid and vertebral arteries in the neck. COMPARISON:  None. FINDINGS: Criteria: Quantification of carotid stenosis is based on velocity parameters that correlate the residual internal carotid diameter with NASCET-based stenosis levels, using the diameter of the distal internal carotid lumen as the denominator for stenosis measurement. The following velocity measurements were obtained: RIGHT ICA:  80/12 cm/sec CCA:  01/60 cm/sec SYSTOLIC ICA/CCA  RATIO:  0.9 DIASTOLIC ICA/CCA RATIO:  1.2 ECA:  117 cm/sec LEFT ICA:  85/17 cm/sec CCA:  10/93 cm/sec SYSTOLIC ICA/CCA RATIO:  1.1 DIASTOLIC ICA/CCA RATIO:  1.3 ECA:  126 cm/sec RIGHT CAROTID ARTERY: Minor echogenic shadowing plaque formation. No hemodynamically significant right ICA stenosis, velocity elevation, or turbulent flow. Degree of narrowing less than 50%. RIGHT VERTEBRAL ARTERY:  Antegrade LEFT CAROTID ARTERY: Similar scattered minor echogenic plaque formation. No hemodynamically significant left ICA stenosis, velocity elevation, or turbulent flow. LEFT VERTEBRAL ARTERY:  Antegrade IMPRESSION: Mild bilateral carotid atherosclerosis. No hemodynamically significant ICA stenosis. Degree of narrowing less than 50% bilaterally by ultrasound criteria. Patent antegrade vertebral flow bilaterally. Electronically Signed   By: Jerilynn Mages.  Shick M.D.   On: 01/17/2018 10:58   Dg Chest Portable 1 View  Result Date: 01/16/2018 CLINICAL DATA:  Chest pain EXAM: PORTABLE CHEST 1 VIEW COMPARISON:  09/21/2017, 09/11/2017, 12/04/2014 FINDINGS: Mild cardiomegaly. Diffuse interstitial opacity, suspect for pulmonary edema. Aortic atherosclerosis. No focal consolidation. No large effusion. No pneumothorax. IMPRESSION: 1. Cardiomegaly. Mild diffuse increased interstitial opacity, suspect acute interstitial edema or inflammation superimposed on chronic changes. 2. No focal pulmonary opacity or pleural effusion is seen. Electronically Signed   By: Donavan Foil M.D.   On: 01/16/2018 14:55   Mr Jodene Nam Head/brain AT Cm  Result Date: 01/17/2018 CLINICAL DATA:  Dialysis patient. Diabetes. Hypertension. Hyperlipidemia. Acute onset of left eye vision loss. Retinal artery occlusion suspected. EXAM: MRI HEAD WITHOUT CONTRAST MRA HEAD WITHOUT CONTRAST TECHNIQUE: Multiplanar, multiecho pulse sequences of the brain and surrounding structures were obtained without intravenous contrast. Angiographic images of the head were obtained using MRA  technique without contrast. COMPARISON:  CT 01/16/2018 FINDINGS: MRI HEAD FINDINGS Brain: Diffusion imaging does not show any acute or subacute infarction. Chronic small-vessel ischemic changes affect the pons. No focal cerebellar insult. Cerebral hemispheres show generalized atrophy with pronounced chronic small-vessel ischemic changes throughout the white matter. Old small vessel infarctions affect the thalami and basal ganglia. No large vessel territory infarction. No mass lesion, hemorrhage, hydrocephalus or extra-axial collection. Vascular: Major vessels at the base of the brain show flow. Skull and upper cervical spine: Negative Sinuses/Orbits: Opacification of the left division of the sphenoid sinus. Other sinuses are clear. Orbits appear normal. Other: None MRA HEAD FINDINGS Both internal carotid arteries are patent through the skull base and siphon regions. There is atherosclerotic narrowing and irregularity in the carotid siphon regions, worse on the right the left. Stenosis on the right is estimated at 70%. Stenosis on the left is estimated at 50%. Supraclinoid internal carotid arteries are patent. The anterior and middle cerebral vessels are patent. Left carotid artery supplies the left middle cerebral artery territory and both anterior cerebral artery territories. There is a tiny A1 segment on the right, probably congenital. Right MCA appears widely patent. There is some narrowing and irregularity of more distal intracranial branch vessels. Flow is evident in both ophthalmic arteries. Both vertebral arteries are widely patent to the basilar.  No basilar stenosis. Posterior circulation branch vessels are patent. More distal PCA branch vessels show some atherosclerotic irregularity. IMPRESSION: No acute infarction of the brain by MRI. Extensive chronic small-vessel ischemic changes throughout as outlined above. Flow is seen in both ophthalmic arteries. Atherosclerotic disease affecting both carotid siphon  regions, right worse than left, with right stenosis estimated at 70% and left stenosis estimated at 50%. Large and medium sized intracranial branch vessels are patent, but show distal vessel atherosclerotic irregularity. Opacification of the left division of the sphenoid sinus. No orbital sequela. Electronically Signed   By: Nelson Chimes M.D.   On: 01/17/2018 13:28    Micro Results    No results found for this or any previous visit (from the past 240 hour(s)).     Today   Subjective:   Phyllis [name] today has no headache,no chest abdominal pain,no new weakness tingling or numbness, feels much better wants to go home today.   Objective:   Blood pressure (!) 145/53, pulse 78, temperature 98.2 F (36.8 C), temperature source Oral, resp. rate 18, height 4\' 10"  (1.473 m), weight 58.1 kg (128 lb 1.4 oz), SpO2 99 %.   Intake/Output Summary (Last 24 hours) at 01/18/2018 0925 Last data filed at 01/17/2018 1851 Gross per 24 hour  Intake 600 ml  Output -  Net 600 ml    Exam Awake Alert, Oriented x 3, No new F.N deficits, Normal affect [place].AT,PERRAL, patient still has significant left eye vision loss but able to see only in the periphery of the left eye.  Left lateral vision is better than before according to her. Supple Neck,No JVD, No cervical lymphadenopathy appriciated.  Symmetrical Chest wall movement, Good air movement bilaterally, CTAB RRR,No Gallops,Rubs or new Murmurs, No Parasternal Heave +ve B.Sounds, Abd Soft, Non tender, No organomegaly appriciated, No rebound -guarding or rigidity. No Cyanosis, Clubbing or edema, No new Rash or bruise  Data Review   CBC w Diff:  Lab Results  Component Value Date   WBC 7.3 01/16/2018   HGB 10.0 (L) 01/16/2018   HGB 9.4 (L) 12/10/2014   HCT 30.2 (L) 01/16/2018   HCT 28.5 (L) 12/10/2014   PLT 173 01/16/2018   PLT 136 (L) 12/10/2014   LYMPHOPCT 10 01/16/2018   LYMPHOPCT 24.4 12/10/2014   MONOPCT 7 01/16/2018   MONOPCT 10.3  12/10/2014   EOSPCT 4 01/16/2018   EOSPCT 6.1 12/10/2014   BASOPCT 1 01/16/2018   BASOPCT 0.6 12/10/2014    CMP:  Lab Results  Component Value Date   NA 137 01/16/2018   NA 142 12/10/2014   K 3.9 01/16/2018   K 3.6 12/10/2014   CL 102 01/16/2018   CL 102 12/10/2014   CO2 21 (L) 01/16/2018   CO2 34 (H) 12/10/2014   BUN 48 (H) 01/16/2018   BUN 19 (H) 12/10/2014   CREATININE 7.28 (H) 01/16/2018   CREATININE 3.30 (H) 12/10/2014   PROT 6.8 01/16/2018   PROT 6.0 (L) 12/07/2014   ALBUMIN 3.7 01/16/2018   ALBUMIN 2.5 (L) 12/07/2014   BILITOT 0.7 01/16/2018   BILITOT 0.3 12/07/2014   ALKPHOS 83 01/16/2018   ALKPHOS 58 12/07/2014   AST 14 (L) 01/16/2018   AST 18 12/07/2014   ALT 8 (L) 01/16/2018   ALT 18 12/07/2014  .   Total Time in preparing paper work, data evaluation and todays exam - 31 minutes  Epifanio Lesches M.D on 01/18/2018 at 9:25 AM    Note: This dictation was prepared with Viviann Spare  dictation along with smaller phrase technology. Any transcriptional errors that result from this process are unintentional.

## 2018-01-18 NOTE — Progress Notes (Signed)
HD tx start 

## 2018-01-18 NOTE — Progress Notes (Signed)
Pt has been discharged home. Discharge papers given and explained to pt. Pt verbalized understanding. Meds and follow up appointments reviewed. RX to be picked up from pharmacy. Pt is aware.

## 2018-01-18 NOTE — Progress Notes (Signed)
Floor RN called back to give report

## 2018-01-18 NOTE — Progress Notes (Signed)
Post HD assessment  

## 2018-01-18 NOTE — Progress Notes (Signed)
Pre HD assessment  

## 2018-01-18 NOTE — Progress Notes (Signed)
Per Dr. Vianne Bulls echo results must be available prior to discharge. Assigned RN made aware. Madlyn Frankel, RN

## 2018-01-18 NOTE — Care Management Note (Signed)
Case Management Note  Patient Details  Name: MERSADES BARBARO MRN: [id number] Date of Birth: [birth year]  Subjective/Objective:  Arranged outpatient PT with [facility] OP PT at [facility] street at patient request. Ordered youth walker from [place] from [place].                   Action/Plan:   Expected Discharge Date:  01/18/18               Expected Discharge Plan:     In-House Referral:     Discharge planning Services     Post Acute Care Choice:    Choice offered to:     DME Arranged:    DME Agency:     HH Arranged:    HH Agency:     Status of Service:     If discussed at [employer], dates discussed:    Additional Comments:  Jolly Mango, RN 01/18/2018, 3:23 PM

## 2018-01-18 NOTE — Progress Notes (Signed)
Per Dr. Vianne Bulls OK to discharge pt.

## 2018-01-18 NOTE — Progress Notes (Signed)
HD COMPLETED  

## 2018-01-18 NOTE — Progress Notes (Signed)
HD RN tried to call floor RN to get report prior to tx. Floor RN was busy and said she would call back once she was available.

## 2018-01-18 NOTE — Discharge Instructions (Signed)
Arrange out patient  Physical therapy

## 2018-01-30 ENCOUNTER — Ambulatory Visit (INDEPENDENT_AMBULATORY_CARE_PROVIDER_SITE_OTHER): Payer: Medicare Other | Admitting: Podiatry

## 2018-01-30 ENCOUNTER — Encounter: Payer: Self-pay | Admitting: Podiatry

## 2018-01-30 DIAGNOSIS — D689 Coagulation defect, unspecified: Secondary | ICD-10-CM | POA: Diagnosis not present

## 2018-01-30 DIAGNOSIS — Q828 Other specified congenital malformations of skin: Secondary | ICD-10-CM | POA: Diagnosis not present

## 2018-01-30 NOTE — Progress Notes (Signed)
She presents today for follow-up of some tenderness to the forefoot left.  States that last time I was here a prescription small piece of glass out of it and I think there is still some left in there.  She states that she is tried working on herself the other day and got a small piece out.  Objective: Vital signs are stable she is alert and oriented x3.  Plantar aspect of the left foot does demonstrate a very small area where it appears that there is a foreign body.  There is some mild erythema with a foreign body centrally located it measures approximately the diameter of a human hair but it appears to be a small piece of glass.  Assessment: Foreign body piece of glass.  Plan: Removal of his foreign body today no complications very simple only charge office visit.

## 2018-02-15 ENCOUNTER — Ambulatory Visit (INDEPENDENT_AMBULATORY_CARE_PROVIDER_SITE_OTHER): Payer: Medicare Other | Admitting: Vascular Surgery

## 2018-02-15 ENCOUNTER — Encounter (INDEPENDENT_AMBULATORY_CARE_PROVIDER_SITE_OTHER): Payer: Self-pay | Admitting: Vascular Surgery

## 2018-02-15 ENCOUNTER — Ambulatory Visit (INDEPENDENT_AMBULATORY_CARE_PROVIDER_SITE_OTHER): Payer: Medicare Other

## 2018-02-15 VITALS — BP 163/68 | HR 72 | Resp 16 | Ht <= 58 in | Wt 131.0 lb

## 2018-02-15 DIAGNOSIS — E782 Mixed hyperlipidemia: Secondary | ICD-10-CM | POA: Diagnosis not present

## 2018-02-15 DIAGNOSIS — N186 End stage renal disease: Secondary | ICD-10-CM

## 2018-02-15 DIAGNOSIS — E1169 Type 2 diabetes mellitus with other specified complication: Secondary | ICD-10-CM

## 2018-02-15 NOTE — Progress Notes (Signed)
Subjective:    Patient ID: Phyllis [name], female    DOB: May 14, 1946, 72 y.o.   MRN: [id number] Chief Complaint  Patient presents with  . Follow-up    6 month HDA   Patient presents for a six month hemodialysis access follow up. The patient underwent a duplex ultrasound of the AV access which was notable for a patent fistula without any significant hemodynamic stenosis. Patient reports his hemodialysis doppler flow is "good" as per her dialysis center.  Total flow volume is 1092.  The patient denies any issues with hemodialysis such as cannulation problems, increased bleeding, decrease in doppler flow or recirculation. The patient also denies any fistula skin breakdown, pain, edema, pallor or ulceration of the arm / hand.  Patient denies any fever, nausea vomiting.  Review of Systems  Constitutional: Negative.   HENT: Negative.   Eyes: Negative.   Respiratory: Negative.   Cardiovascular: Negative.   Gastrointestinal: Negative.   Endocrine: Negative.   Genitourinary:       ESRD  Musculoskeletal: Negative.   Skin: Negative.   Allergic/Immunologic: Negative.   Neurological: Negative.   Hematological: Negative.   Psychiatric/Behavioral: Negative.       Objective:   Physical Exam  Constitutional: She is oriented to person, place, and time. She appears well-developed and well-nourished. No distress.  HENT:  Head: Normocephalic and atraumatic.  Eyes: Pupils are equal, round, and reactive to light. Conjunctivae are normal.  Neck: Normal range of motion.  Cardiovascular: Normal rate, regular rhythm, normal heart sounds and intact distal pulses.  Pulses:      Radial pulses are 2+ on the right side, and 2+ on the left side.  Left upper extremity dialysis access: Good bruit and thrill skin is intact.  Pulmonary/Chest: Effort normal and breath sounds normal.  Musculoskeletal: Normal range of motion. She exhibits no edema.  Neurological: She is alert and oriented to person, place,  and time.  Skin: Skin is warm and dry. She is not diaphoretic.  Psychiatric: She has a normal mood and affect. Her behavior is normal. Judgment and thought content normal.  Vitals reviewed.  BP (!) 163/68 (BP Location: Right Arm, Patient Position: Sitting)   Pulse 72   Resp 16   Ht 4\' 10"  (1.473 m)   Wt 131 lb (59.4 kg)   BMI 27.38 kg/m   Past Medical History:  Diagnosis Date  . Allergy   . Anemia of chronic disease   . Anxiety   . Aortic atherosclerosis ([place])   . Asthma   . Chronic back pain   . COPD (chronic obstructive pulmonary disease) ([place])   . Diabetes mellitus with complication ([place])   . ESRD on hemodialysis ([place])    a. Tues/Sat; b. 2/2 small kidneys  . Essential hypertension   . Fistula    lower left arm  . GERD (gastroesophageal reflux disease)   . Gout   . History of echocardiogram    a. TTE 01/2014: nl LV sys fxn, no valvular abnormalities; b. TTE 11/16: nl EF, mild LVH  . History of exercise stress test    a. 01/2014: no evidence of ischemia; b. Lexiscan 08/2015: no sig ischemia, severe GI uptake artifact, low risk; c. CPET @ [facility] 09/2016: exercised 3 min 12 sec on bike without incline, 2.28 METs, VO2 of 8.1, 48% of predicted, indicating mod to sev functional impairment, evidence of blunted HR, stroke volume, and BP augmentation as well as ventilation-perfusion mismatch with exercise  . HLD (hyperlipidemia)   .  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 Renal insufficiency 09/24/2017   Dialysis patient.  . Sleep apnea    Social History   Socioeconomic History  . Marital status: Single    Spouse name: Not on file  . Number of children: Not on file  . Years of education: Not on file  . Highest education level: Not on file  Occupational History  . Not on file  Social Needs  . Financial resource strain: Not on file  . Food insecurity:    Worry: Not on file    Inability: Not on file  . Transportation needs:    Medical: Not on file     Non-medical: Not on file  Tobacco Use  . Smoking status: Never Smoker  . Smokeless tobacco: Never Used  Substance and Sexual Activity  . Alcohol use: No  . Drug use: No  . Sexual activity: Not Currently  Lifestyle  . Physical activity:    Days per week: Not on file    Minutes per session: Not on file  . Stress: Not on file  Relationships  . Social connections:    Talks on phone: Not on file    Gets together: Not on file    Attends religious service: Not on file    Active member of club or organization: Not on file    Attends meetings of clubs or organizations: Not on file    Relationship status: Not on file  . Intimate partner violence:    Fear of current or ex partner: Not on file    Emotionally abused: Not on file    Physically abused: Not on file    Forced sexual activity: Not on file  Other Topics Concern  . Not on file  Social History Narrative  . Not on file   Past Surgical History:  Procedure Laterality Date  . carpel tunnel    . GALLBLADDER SURGERY    . PERIPHERAL VASCULAR CATHETERIZATION N/A 04/12/2015   Procedure: A/V Shuntogram/Fistulagram;  Surgeon: Algernon Huxley, MD;  Location: [facility];  Service: Cardiovascular;  Laterality: N/A;  . PERIPHERAL VASCULAR CATHETERIZATION N/A 04/12/2015   Procedure: A/V Shunt Intervention;  Surgeon: Algernon Huxley, MD;  Location: [place] [facility];  Service: Cardiovascular;  Laterality: N/A;  . PERIPHERAL VASCULAR CATHETERIZATION N/A 06/09/2015   Procedure: Dialysis/Perma Catheter Removal;  Surgeon: Katha Cabal, MD;  Location: [facility];  Service: Cardiovascular;  Laterality: N/A;   Family History  Problem Relation Age of Onset  . Hypertension Mother   . Hyperlipidemia Mother   . Heart disease Father   . Heart attack Father 5  . Hypertension Father   . Hyperlipidemia Father   . Heart disease Brother        CABG   . Heart attack Brother   . Breast cancer Neg Hx    Allergies  Allergen Reactions    . Codeine Nausea And Vomiting and Nausea Only  . Enalapril Maleate     Other reaction(s): Headache  . Nitrofurantoin Swelling and Rash    Other Reaction: swelling of body  . Sulfamethoxazole-Trimethoprim Swelling  . Vicodin [Hydrocodone-Acetaminophen] Nausea And Vomiting and Nausea Only  . 2,4-D Dimethylamine (Amisol) Rash    Other Reaction: h/a  . Baclofen Other (See Comments) and Nausea Only    lightheadness ,drowsiness , muscle weakness , twitching in hands   . Neosporin [Neomycin-Bacitracin Zn-Polymyx] Other (See Comments) and Rash  Other Reaction: irritation Skin irritation   . Quinine Rash    Other Reaction: Vomiting rash, h/a, vision  . Ultram [Tramadol] Palpitations  . Zocor [Simvastatin] Other (See Comments) and Rash    Other Reaction: muscle spasms Muscle pain and spasms  . Bactrim [Sulfamethoxazole-Trimethoprim] Swelling  . Levodopa   . Macrodantin [Nitrofurantoin Macrocrystal] Swelling  . Quinine Derivatives Other (See Comments)    Vertigo,nausea vomiting blurred vision headache ears sensitivity   . Vasotec [Enalapril] Other (See Comments)    Headaches       Assessment & Plan:  Patient presents for a six month hemodialysis access follow up. The patient underwent a duplex ultrasound of the AV access which was notable for a patent fistula without any significant hemodynamic stenosis. Patient reports his hemodialysis doppler flow is "good" as per her dialysis center.  Total flow volume is 1092.  The patient denies any issues with hemodialysis such as cannulation problems, increased bleeding, decrease in doppler flow or recirculation. The patient also denies any fistula skin breakdown, pain, edema, pallor or ulceration of the arm / hand.  Patient denies any fever, nausea vomiting.  1. End stage renal disease ([place]) - Stable Studies reviewed with patient. The patient is doing well and currently has adequate dialysis access. Duplex ultrasound of the AV access shows a  patent access with no evidence of hemodynamically significant strictures or stenosis.  The patient should continue to have duplex ultrasounds of the dialysis access every six months. The patient was instructed to call the office in the interim if any issues with dialysis access / doppler flow, pain, edema, pallor, fistula skin breakdown or ulceration of the arm / hand occur. The patient expressed their understanding.  - VAS US DUPLEX DIALYSIS ACCESS (AVF,AVG); Future  2. Type 2 diabetes mellitus with other specified complication, unspecified whether long term insulin use (HCC) - Stable Encouraged good control as its slows the progression of atherosclerotic disease  3. Mixed hyperlipidemia - Stable Encouraged good control as its slows the progression of atherosclerotic disease  Current Outpatient Medications on File Prior to Visit  Medication Sig Dispense Refill  . albuterol (PROVENTIL HFA;VENTOLIN HFA) 108 (90 Base) MCG/ACT inhaler Inhale 2 puffs into the lungs every 6 (six) hours as needed for wheezing or shortness of breath.    Marland Kitchen amLODipine (NORVASC) 10 MG tablet Take 10 mg by mouth.     Marland Kitchen aspirin EC 81 MG EC tablet Take 1 tablet (81 mg total) by mouth daily.    . budesonide-formoterol (SYMBICORT) 160-4.5 MCG/ACT inhaler Inhale into the lungs.    . calcium acetate (PHOSLO) 667 MG capsule Take 667 mg by mouth 3 (three) times daily with meals.     . carvedilol (COREG) 12.5 MG tablet Take 12.5 mg by mouth 2 (two) times daily with a meal.     . cetirizine (ZYRTEC) 10 MG tablet Take 10 mg by mouth as needed for allergies.    Marland Kitchen clopidogrel (PLAVIX) 75 MG tablet Take 1 tablet (75 mg total) by mouth daily. 30 tablet 0  . cyclobenzaprine (FLEXERIL) 10 MG tablet Take 10 mg by mouth 3 (three) times daily as needed for muscle spasms.    Marland Kitchen docusate sodium (COLACE) 100 MG capsule Take 100 mg by mouth 3 (three) times daily as needed for mild constipation.    . Fluticasone-Salmeterol (ADVAIR) 250-50  MCG/DOSE AEPB Inhale 1 puff into the lungs 2 (two) times daily.    Marland Kitchen HYDROcodone-acetaminophen (NORCO) 5-325 MG tablet Take 1 tablet by  mouth every 6 (six) hours as needed for moderate pain or severe pain. 10 tablet 0  . lidocaine-prilocaine (EMLA) cream Apply 1 application topically as needed.    . nitroGLYCERIN (NITROSTAT) 0.4 MG SL tablet Place 1 tablet (0.4 mg total) under the tongue every 5 (five) minutes as needed for chest pain. 25 tablet 1  . omeprazole (PRILOSEC) 20 MG capsule Take 20 mg by mouth 2 (two) times daily before a meal.    . ondansetron (ZOFRAN) 4 MG tablet Take 4 mg by mouth every 8 (eight) hours as needed for nausea or vomiting.    Marland Kitchen oxybutynin (DITROPAN XL) 15 MG 24 hr tablet Take 15 mg by mouth daily.    . pravastatin (PRAVACHOL) 40 MG tablet Take 40 mg by mouth daily.     Marland Kitchen senna (SENOKOT) 8.6 MG tablet Take 1 tablet by mouth daily.    Marland Kitchen topiramate (TOPAMAX) 25 MG tablet Take 25 mg by mouth as needed.     . VESICARE 10 MG tablet     . Vitamin D, Ergocalciferol, (DRISDOL) 50000 UNITS CAPS capsule Take 50,000 Units by mouth every Wednesday.      No current facility-administered medications on file prior to visit.    There are no Patient Instructions on file for this visit. No follow-ups on file.  KIMBERLY A STEGMAYER, PA-C

## 2018-03-16 ENCOUNTER — Inpatient Hospital Stay
Admission: EM | Admit: 2018-03-16 | Discharge: 2018-03-17 | DRG: 291 | Disposition: A | Discharge status: Home / Self Care | Payer: Medicare Other | Attending: Internal Medicine | Admitting: Internal Medicine

## 2018-03-16 ENCOUNTER — Encounter: Payer: Self-pay | Admitting: Internal Medicine

## 2018-03-16 ENCOUNTER — Other Ambulatory Visit: Payer: Self-pay

## 2018-03-16 ENCOUNTER — Emergency Department: Payer: Medicare Other

## 2018-03-16 DIAGNOSIS — G4733 Obstructive sleep apnea (adult) (pediatric): Secondary | ICD-10-CM | POA: Diagnosis present

## 2018-03-16 DIAGNOSIS — L8990 Pressure ulcer of unspecified site, unspecified stage: Secondary | ICD-10-CM

## 2018-03-16 DIAGNOSIS — Z7982 Long term (current) use of aspirin: Secondary | ICD-10-CM

## 2018-03-16 DIAGNOSIS — R0602 Shortness of breath: Secondary | ICD-10-CM | POA: Diagnosis present

## 2018-03-16 DIAGNOSIS — Z992 Dependence on renal dialysis: Secondary | ICD-10-CM | POA: Diagnosis not present

## 2018-03-16 DIAGNOSIS — M25511 Pain in right shoulder: Secondary | ICD-10-CM | POA: Diagnosis present

## 2018-03-16 DIAGNOSIS — I132 Hypertensive heart and chronic kidney disease with heart failure and with stage 5 chronic kidney disease, or end stage renal disease: Principal | ICD-10-CM | POA: Diagnosis present

## 2018-03-16 DIAGNOSIS — J9601 Acute respiratory failure with hypoxia: Secondary | ICD-10-CM | POA: Diagnosis not present

## 2018-03-16 DIAGNOSIS — Z888 Allergy status to other drugs, medicaments and biological substances status: Secondary | ICD-10-CM

## 2018-03-16 DIAGNOSIS — I5031 Acute diastolic (congestive) heart failure: Secondary | ICD-10-CM | POA: Diagnosis not present

## 2018-03-16 DIAGNOSIS — E8770 Fluid overload, unspecified: Secondary | ICD-10-CM

## 2018-03-16 DIAGNOSIS — G8929 Other chronic pain: Secondary | ICD-10-CM | POA: Diagnosis present

## 2018-03-16 DIAGNOSIS — N186 End stage renal disease: Secondary | ICD-10-CM | POA: Diagnosis present

## 2018-03-16 DIAGNOSIS — I700 Atherosclerosis of aorta: Secondary | ICD-10-CM | POA: Diagnosis present

## 2018-03-16 DIAGNOSIS — M542 Cervicalgia: Secondary | ICD-10-CM | POA: Diagnosis present

## 2018-03-16 DIAGNOSIS — I5033 Acute on chronic diastolic (congestive) heart failure: Secondary | ICD-10-CM | POA: Diagnosis present

## 2018-03-16 DIAGNOSIS — M549 Dorsalgia, unspecified: Secondary | ICD-10-CM | POA: Diagnosis present

## 2018-03-16 DIAGNOSIS — I509 Heart failure, unspecified: Secondary | ICD-10-CM

## 2018-03-16 DIAGNOSIS — Z7951 Long term (current) use of inhaled steroids: Secondary | ICD-10-CM

## 2018-03-16 DIAGNOSIS — Z8349 Family history of other endocrine, nutritional and metabolic diseases: Secondary | ICD-10-CM

## 2018-03-16 DIAGNOSIS — Z8249 Family history of ischemic heart disease and other diseases of the circulatory system: Secondary | ICD-10-CM

## 2018-03-16 DIAGNOSIS — M109 Gout, unspecified: Secondary | ICD-10-CM | POA: Diagnosis present

## 2018-03-16 DIAGNOSIS — J810 Acute pulmonary edema: Secondary | ICD-10-CM

## 2018-03-16 DIAGNOSIS — F419 Anxiety disorder, unspecified: Secondary | ICD-10-CM | POA: Diagnosis present

## 2018-03-16 DIAGNOSIS — K219 Gastro-esophageal reflux disease without esophagitis: Secondary | ICD-10-CM | POA: Diagnosis present

## 2018-03-16 DIAGNOSIS — Z882 Allergy status to sulfonamides status: Secondary | ICD-10-CM

## 2018-03-16 DIAGNOSIS — I158 Other secondary hypertension: Secondary | ICD-10-CM | POA: Diagnosis present

## 2018-03-16 DIAGNOSIS — D631 Anemia in chronic kidney disease: Secondary | ICD-10-CM | POA: Diagnosis present

## 2018-03-16 DIAGNOSIS — H547 Unspecified visual loss: Secondary | ICD-10-CM | POA: Diagnosis present

## 2018-03-16 DIAGNOSIS — J449 Chronic obstructive pulmonary disease, unspecified: Secondary | ICD-10-CM | POA: Diagnosis present

## 2018-03-16 DIAGNOSIS — E785 Hyperlipidemia, unspecified: Secondary | ICD-10-CM | POA: Diagnosis present

## 2018-03-16 DIAGNOSIS — Z7902 Long term (current) use of antithrombotics/antiplatelets: Secondary | ICD-10-CM

## 2018-03-16 DIAGNOSIS — E1122 Type 2 diabetes mellitus with diabetic chronic kidney disease: Secondary | ICD-10-CM | POA: Diagnosis present

## 2018-03-16 DIAGNOSIS — Z8673 Personal history of transient ischemic attack (TIA), and cerebral infarction without residual deficits: Secondary | ICD-10-CM

## 2018-03-16 DIAGNOSIS — Z79899 Other long term (current) drug therapy: Secondary | ICD-10-CM

## 2018-03-16 DIAGNOSIS — N2581 Secondary hyperparathyroidism of renal origin: Secondary | ICD-10-CM | POA: Diagnosis present

## 2018-03-16 DIAGNOSIS — I350 Nonrheumatic aortic (valve) stenosis: Secondary | ICD-10-CM | POA: Diagnosis present

## 2018-03-16 DIAGNOSIS — Z885 Allergy status to narcotic agent status: Secondary | ICD-10-CM

## 2018-03-16 DIAGNOSIS — K5900 Constipation, unspecified: Secondary | ICD-10-CM | POA: Diagnosis present

## 2018-03-16 LAB — CBC
HCT: 30.5 % — ABNORMAL LOW (ref 35.0–47.0)
Hemoglobin: 10.4 g/dL — ABNORMAL LOW (ref 12.0–16.0)
MCH: 30.2 pg (ref 26.0–34.0)
MCHC: 34.1 g/dL (ref 32.0–36.0)
MCV: 88.6 fL (ref 80.0–100.0)
Platelets: 187 10*3/uL (ref 150–440)
RBC: 3.44 MIL/uL — ABNORMAL LOW (ref 3.80–5.20)
RDW: 15.7 % — ABNORMAL HIGH (ref 11.5–14.5)
WBC: 7.2 10*3/uL (ref 3.6–11.0)

## 2018-03-16 LAB — URINALYSIS, COMPLETE (UACMP) WITH MICROSCOPIC
Bilirubin Urine: NEGATIVE
Glucose, UA: 50 mg/dL — AB
Hgb urine dipstick: NEGATIVE
Ketones, ur: NEGATIVE mg/dL
Nitrite: NEGATIVE
Protein, ur: 100 mg/dL — AB
Specific Gravity, Urine: 1.011 (ref 1.005–1.030)
WBC, UA: >50 WBC/hpf — ABNORMAL HIGH (ref 0–5)
pH: 7 (ref 5.0–8.0)

## 2018-03-16 LAB — BASIC METABOLIC PANEL
Anion gap: 11 (ref 5–15)
BUN: 46 mg/dL — ABNORMAL HIGH (ref 6–20)
CO2: 25 mmol/L (ref 22–32)
Calcium: 8.6 mg/dL — ABNORMAL LOW (ref 8.9–10.3)
Chloride: 101 mmol/L (ref 101–111)
Creatinine, Ser: 6.73 mg/dL — ABNORMAL HIGH (ref 0.44–1.00)
GFR calc Af Amer: 6 mL/min — ABNORMAL LOW (ref >60)
GFR calc non Af Amer: 6 mL/min — ABNORMAL LOW (ref >60)
Glucose, Bld: 120 mg/dL — ABNORMAL HIGH (ref 65–99)
Potassium: 4.8 mmol/L (ref 3.5–5.1)
Sodium: 137 mmol/L (ref 135–145)

## 2018-03-16 LAB — GLUCOSE, CAPILLARY: Glucose-Capillary: 112 mg/dL — ABNORMAL HIGH (ref 65–99)

## 2018-03-16 LAB — MRSA PCR SCREENING: MRSA by PCR: NEGATIVE

## 2018-03-16 LAB — TROPONIN I: Troponin I: 0.03 ng/mL (ref <0.03)

## 2018-03-16 MED ORDER — AMLODIPINE BESYLATE 10 MG PO TABS
10.0000 mg | ORAL_TABLET | Freq: Every day | ORAL | Status: DC
  Administered 2018-03-16 – 2018-03-17 (×2): 10 mg via ORAL
  Filled 2018-03-16 (×2): qty 1

## 2018-03-16 MED ORDER — IPRATROPIUM-ALBUTEROL 0.5-2.5 (3) MG/3ML IN SOLN
3.0000 mL | Freq: Once | RESPIRATORY_TRACT | Status: AC
  Administered 2018-03-16: 3 mL via RESPIRATORY_TRACT

## 2018-03-16 MED ORDER — BUDESONIDE 0.5 MG/2ML IN SUSP
0.5000 mg | Freq: Two times a day (BID) | RESPIRATORY_TRACT | Status: DC
  Administered 2018-03-16 – 2018-03-17 (×3): 0.5 mg via RESPIRATORY_TRACT
  Filled 2018-03-16 (×4): qty 2

## 2018-03-16 MED ORDER — FUROSEMIDE 10 MG/ML IJ SOLN
80.0000 mg | Freq: Once | INTRAMUSCULAR | Status: AC
  Administered 2018-03-16: 80 mg via INTRAVENOUS
  Filled 2018-03-16: qty 8

## 2018-03-16 MED ORDER — CYCLOBENZAPRINE HCL 10 MG PO TABS
10.0000 mg | ORAL_TABLET | Freq: Three times a day (TID) | ORAL | Status: DC | PRN
Start: 2018-03-16 — End: 2018-03-17
  Filled 2018-03-16: qty 1

## 2018-03-16 MED ORDER — CARVEDILOL 12.5 MG PO TABS
12.5000 mg | ORAL_TABLET | Freq: Two times a day (BID) | ORAL | Status: DC
  Administered 2018-03-16 – 2018-03-17 (×2): 12.5 mg via ORAL
  Filled 2018-03-16 (×2): qty 1

## 2018-03-16 MED ORDER — ACETAMINOPHEN 650 MG RE SUPP: 650.0000 mg | Freq: Four times a day (QID) | RECTAL | Status: DC | PRN

## 2018-03-16 MED ORDER — FUROSEMIDE 10 MG/ML IJ SOLN
40.0000 mg | Freq: Once | INTRAMUSCULAR | Status: AC
  Administered 2018-03-16: 40 mg via INTRAVENOUS
  Filled 2018-03-16: qty 4

## 2018-03-16 MED ORDER — FUROSEMIDE 80 MG PO TABS: 80.0000 mg | ORAL_TABLET | Freq: Every day | ORAL | Status: DC

## 2018-03-16 MED ORDER — NITROGLYCERIN 2 % TD OINT
TOPICAL_OINTMENT | TRANSDERMAL | Status: AC
  Filled 2018-03-16: qty 1

## 2018-03-16 MED ORDER — ONDANSETRON HCL 4 MG PO TABS: 4.0000 mg | ORAL_TABLET | Freq: Three times a day (TID) | ORAL | Status: DC | PRN

## 2018-03-16 MED ORDER — IPRATROPIUM-ALBUTEROL 0.5-2.5 (3) MG/3ML IN SOLN
RESPIRATORY_TRACT | Status: AC
  Administered 2018-03-16: 3 mL via RESPIRATORY_TRACT
  Filled 2018-03-16: qty 3

## 2018-03-16 MED ORDER — IPRATROPIUM-ALBUTEROL 0.5-2.5 (3) MG/3ML IN SOLN
3.0000 mL | Freq: Four times a day (QID) | RESPIRATORY_TRACT | Status: DC
  Administered 2018-03-16 – 2018-03-17 (×4): 3 mL via RESPIRATORY_TRACT
  Filled 2018-03-16 (×6): qty 3

## 2018-03-16 MED ORDER — ASPIRIN EC 81 MG PO TBEC
81.0000 mg | DELAYED_RELEASE_TABLET | Freq: Every day | ORAL | Status: DC
  Administered 2018-03-16 – 2018-03-17 (×2): 81 mg via ORAL
  Filled 2018-03-16 (×2): qty 1

## 2018-03-16 MED ORDER — KETOROLAC TROMETHAMINE 15 MG/ML IJ SOLN
15.0000 mg | Freq: Once | INTRAMUSCULAR | Status: AC
  Administered 2018-03-16: 15 mg via INTRAVENOUS
  Filled 2018-03-16: qty 1

## 2018-03-16 MED ORDER — PRAVASTATIN SODIUM 20 MG PO TABS
40.0000 mg | ORAL_TABLET | Freq: Every day | ORAL | Status: DC
  Administered 2018-03-16 – 2018-03-17 (×2): 40 mg via ORAL
  Filled 2018-03-16 (×2): qty 2

## 2018-03-16 MED ORDER — SENNA 8.6 MG PO TABS
1.0000 | ORAL_TABLET | Freq: Every day | ORAL | Status: DC
  Administered 2018-03-17: 8.6 mg via ORAL
  Filled 2018-03-16 (×3): qty 1

## 2018-03-16 MED ORDER — NITROGLYCERIN 2 % TD OINT
1.0000 [in_us] | TOPICAL_OINTMENT | Freq: Once | TRANSDERMAL | Status: AC
  Administered 2018-03-16: 1 [in_us] via TOPICAL

## 2018-03-16 MED ORDER — HYDRALAZINE HCL 20 MG/ML IJ SOLN: 10.0000 mg | INTRAMUSCULAR | Status: DC | PRN

## 2018-03-16 MED ORDER — NITROGLYCERIN 0.4 MG SL SUBL: 0.4000 mg | SUBLINGUAL_TABLET | SUBLINGUAL | Status: DC | PRN

## 2018-03-16 MED ORDER — DOCUSATE SODIUM 100 MG PO CAPS: 100.0000 mg | ORAL_CAPSULE | Freq: Three times a day (TID) | ORAL | Status: DC | PRN

## 2018-03-16 MED ORDER — PANTOPRAZOLE SODIUM 40 MG PO TBEC
40.0000 mg | DELAYED_RELEASE_TABLET | Freq: Every day | ORAL | Status: DC
  Administered 2018-03-16 – 2018-03-17 (×2): 40 mg via ORAL
  Filled 2018-03-16 (×2): qty 1

## 2018-03-16 MED ORDER — HYDROCODONE-ACETAMINOPHEN 5-325 MG PO TABS
1.0000 | ORAL_TABLET | Freq: Four times a day (QID) | ORAL | Status: DC | PRN
  Administered 2018-03-16: 1 via ORAL
  Filled 2018-03-16: qty 1

## 2018-03-16 MED ORDER — METHYLPREDNISOLONE SODIUM SUCC 40 MG IJ SOLR
40.0000 mg | Freq: Two times a day (BID) | INTRAMUSCULAR | Status: DC
  Administered 2018-03-16 (×2): 40 mg via INTRAVENOUS
  Filled 2018-03-16 (×3): qty 1

## 2018-03-16 MED ORDER — ACETAMINOPHEN 325 MG PO TABS: 650.0000 mg | ORAL_TABLET | Freq: Four times a day (QID) | ORAL | Status: DC | PRN

## 2018-03-16 MED ORDER — LORATADINE 10 MG PO TABS
10.0000 mg | ORAL_TABLET | Freq: Every day | ORAL | Status: DC
  Administered 2018-03-16 – 2018-03-17 (×2): 10 mg via ORAL
  Filled 2018-03-16 (×2): qty 1

## 2018-03-16 MED ORDER — HEPARIN SODIUM (PORCINE) 5000 UNIT/ML IJ SOLN
5000.0000 [IU] | Freq: Three times a day (TID) | INTRAMUSCULAR | Status: DC
  Administered 2018-03-16 – 2018-03-17 (×3): 5000 [IU] via SUBCUTANEOUS
  Filled 2018-03-16 (×3): qty 1

## 2018-03-16 MED ORDER — CLOPIDOGREL BISULFATE 75 MG PO TABS
75.0000 mg | ORAL_TABLET | Freq: Every day | ORAL | Status: DC
  Administered 2018-03-16 – 2018-03-17 (×2): 75 mg via ORAL
  Filled 2018-03-16 (×2): qty 1

## 2018-03-16 MED ORDER — CALCIUM ACETATE (PHOS BINDER) 667 MG PO CAPS
667.0000 mg | ORAL_CAPSULE | Freq: Three times a day (TID) | ORAL | Status: DC
  Administered 2018-03-16 – 2018-03-17 (×3): 667 mg via ORAL
  Filled 2018-03-16 (×5): qty 1

## 2018-03-16 MED ORDER — OXYBUTYNIN CHLORIDE ER 5 MG PO TB24
15.0000 mg | ORAL_TABLET | Freq: Every day | ORAL | Status: DC
  Administered 2018-03-16 – 2018-03-17 (×2): 15 mg via ORAL
  Filled 2018-03-16 (×2): qty 3

## 2018-03-16 MED ORDER — LIDOCAINE-PRILOCAINE 2.5-2.5 % EX CREA
1.0000 "application " | TOPICAL_CREAM | CUTANEOUS | Status: DC | PRN
  Filled 2018-03-16: qty 5

## 2018-03-16 NOTE — ED Notes (Addendum)
Pt reports SOB at home and wheezing "really bad" Used Proair x2 and Symbicort x 1 without positive results; pt reports SOB with any activity  Pt dialysis (left arm shunt) on Tues and Saturday, has some remaining kidney function and hx of CHF  Pt clear but diminished especially diminshed in lower lobes

## 2018-03-16 NOTE — Progress Notes (Signed)
This note also relates to the following rows which could not be included: Pulse Rate - Cannot attach notes to unvalidated device data Resp - Cannot attach notes to unvalidated device data SpO2 - Cannot attach notes to unvalidated device data  Hd started  

## 2018-03-16 NOTE — ED Notes (Signed)
Pt with increased SOB - O2 80% and pt panicked, BIPAP applied with nominal efficacy, pt manually ventilated by Dr Dahlia Client, sats to 100% and pt tired but no longer panicked

## 2018-03-16 NOTE — ED Notes (Signed)
Rhonchi auscultated throughout upper lobes from front

## 2018-03-16 NOTE — ED Notes (Signed)
This RN and Arlyss Repress, Student RN to bedside at this time to introduce selves to patient and friend at bedside. Pt repositioned in bed for comfort. Pt remains on BiPap at this time. Pt c/o R shoulder pain, worse with movement. Pt O2 sat 100% on BiPap.

## 2018-03-16 NOTE — Progress Notes (Signed)
[facility]  ROUNDING NOTE   Subjective:   [name] admitted to [facility] on 03/16/2018 for Shortness of breath [R06.02] Acute pulmonary edema ([place]) [J81.0]  Last hemodialysis treatment was Tuesday. She gets hemodialysis on Mondays and Saturdays only.  Developed shortness of breath since yesterday.   Objective:  Vital signs in last 24 hours:  Temp:  [98.2 F (36.8 C)-98.4 F (36.9 C)] 98.2 F (36.8 C) (04/27 0946) Pulse Rate:  [83-112] 103 (04/27 1030) Resp:  [16-39] 33 (04/27 1030) BP: (153-200)/(59-104) 162/63 (04/27 1000) SpO2:  [75 %-100 %] 75 % (04/27 1030) FiO2 (%):  [30 %-100 %] 30 % (04/27 0946) Weight:  [59.7 kg (131 lb 9.8 oz)-60.8 kg (134 lb)] 59.7 kg (131 lb 9.8 oz) (04/27 0946)  Weight change:  Filed Weights   03/16/18 0201 03/16/18 0946  Weight: 60.8 kg (134 lb) 59.7 kg (131 lb 9.8 oz)    Intake/Output: No intake/output data recorded.   Intake/Output this shift:  No intake/output data recorded.  Physical Exam: General: Critically ill   Head: BIPAP  Eyes: Anicteric, PERRL  Neck: Supple, trachea midline  Lungs:  Diminished bilaterally, BIPAP  Heart: Regular rate and rhythm  Abdomen:  Soft, nontender,   Extremities: trace peripheral edema.  Neurologic: Nonfocal, moving all four extremities  Skin: No lesions  Access: Left AVF    Basic Metabolic Panel: Recent Labs  Lab 03/16/18 0208  NA 137  K 4.8  CL 101  CO2 25  GLUCOSE 120*  BUN 46*  CREATININE 6.73*  CALCIUM 8.6*    Liver Function Tests: No results for input(s): AST, ALT, ALKPHOS, BILITOT, PROT, ALBUMIN in the last 168 hours. No results for input(s): LIPASE, AMYLASE in the last 168 hours. No results for input(s): AMMONIA in the last 168 hours.  CBC: Recent Labs  Lab 03/16/18 0208  WBC 7.2  HGB 10.4*  HCT 30.5*  MCV 88.6  PLT 187    Cardiac Enzymes: Recent Labs  Lab 03/16/18 0208  TROPONINI 0.03*    BNP: Invalid input(s): POCBNP  CBG: Recent  Labs  Lab 03/16/18 0944  GLUCAP 112*    Microbiology: No results found for this or any previous visit.  Coagulation Studies: No results for input(s): LABPROT, INR in the last 72 hours.  Urinalysis: Recent Labs    03/16/18 0348  COLORURINE STRAW*  LABSPEC 1.011  PHURINE 7.0  GLUCOSEU 50*  HGBUR NEGATIVE  BILIRUBINUR NEGATIVE  KETONESUR NEGATIVE  PROTEINUR 100*  NITRITE NEGATIVE  LEUKOCYTESUR SMALL*      Imaging: Dg Chest 2 View  Result Date: 03/16/2018 CLINICAL DATA:  Dyspnea x1 day with chest pain EXAM: CHEST - 2 VIEW COMPARISON:  01/16/2018 CXR FINDINGS: Stable cardiomegaly with tortuous atherosclerotic aorta. Diffuse increase in interstitial lung markings consistent with interstitial edema. No alveolar consolidation, effusion or pneumothorax. Slight blunting of the posterior costophrenic angles likely related to small pleural effusions. IMPRESSION: 1. Stable cardiomegaly with tortuous atherosclerotic aorta. No aneurysm. 2. Diffuse interstitial edema with small posterior pleural effusions. Electronically Signed   By: Ashley Royalty M.D.   On: 03/16/2018 02:45     Medications:    . amLODipine  10 mg Oral Daily  . aspirin EC  81 mg Oral Daily  . budesonide (PULMICORT) nebulizer solution  0.5 mg Nebulization BID  . calcium acetate  667 mg Oral TID WC  . carvedilol  12.5 mg Oral BID WC  . clopidogrel  75 mg Oral Daily  . [START ON 03/17/2018]  furosemide  80 mg Oral Daily  . heparin  5,000 Units Subcutaneous Q8H  . ipratropium-albuterol  3 mL Nebulization Q6H  . loratadine  10 mg Oral Daily  . methylPREDNISolone (SOLU-MEDROL) injection  40 mg Intravenous Q12H  . oxybutynin  15 mg Oral Daily  . pantoprazole  40 mg Oral Daily  . pravastatin  40 mg Oral Daily  . senna  1 tablet Oral Daily   acetaminophen **OR** acetaminophen, cyclobenzaprine, docusate sodium, HYDROcodone-acetaminophen, lidocaine-prilocaine, nitroGLYCERIN, ondansetron  Assessment/ Plan:  [name] is a 72 y.o.  female [name] is a 72 y.o. white female with end stage renal disease on hemodialysis, hypertension, hyperlipidemia, gout, GERD, diabetes mellitus type II, COPD  TTS [place]. Left AVF 58.5kg 132min  1. End Stage Renal Disease: emergent hemodialysis today for fluid overload and pulmonary edema leading to acute respiratory failure requiring noninvasive ventilation.   2. Hypertension:   - amlodipine, carvedilol   3. Anemia of chronic kidney disease: hemoglobin 10.4 - EPO as outpatient.   4. Secondary Hyperparathyroidism: outpatient PTH elevated at 642.  - Calcium acetate with meals.   5. Acute respiratory failure: requiring noninvasive mechanical ventilation. Secondary to pulmonary edema, valvular heart disease and acute exacerbation of COPD.  - Emergent hemodialysis  - Steroids, nebs.    LOS: 0 Stephene Alegria 4/27/201910:42 AM

## 2018-03-16 NOTE — ED Notes (Signed)
Patient transported to X-ray 

## 2018-03-16 NOTE — Progress Notes (Signed)
This note also relates to the following rows which could not be included: Pulse Rate - Cannot attach notes to unvalidated device data Resp - Cannot attach notes to unvalidated device data BP - Cannot attach notes to unvalidated device data  Hd completed  

## 2018-03-16 NOTE — ED Notes (Addendum)
Pt assisted to toilet, stand and pivot, pt wheezing when returned to toilet, rhonchi auscultated in right upper lobe, DR Dahlia Client notified and at bedside

## 2018-03-16 NOTE — Progress Notes (Signed)
Patient ID: Phyllis [name], female   DOB: [birth year], 72 y.o.   MRN: [id number]  ACP note  Patient and family at bedside  Diagnosis: Acute hypoxic respiratory failure requiring BiPAP, acute on chronic diastolic congestive heart failure, COPD exacerbation, end-stage renal disease on hemodialysis, GERD, hyperlipidemia.   Patient will be admitted to the CCU stepdown since the patient is on BiPAP.  I will give an extra dose of Lasix.  Case discussed with nephrology to do dialysis today.  Case discussed with critical care specialist.  CODE STATUS discussed.  Patient would like to be a full code.  Time spent on ACP discussion 17 minutes  Dr. Marquis Lunch

## 2018-03-16 NOTE — Progress Notes (Signed)
Patient no longer requiring oxygen.  O2 sats above 90% on room air.  Friend at bedside.  No acute distress noted.  Patient will be transferred to 256.  Report given to Adonis Huguenin, Therapist, sports.

## 2018-03-16 NOTE — Progress Notes (Signed)
Eating dinner.  Alert with no respiratory distress on room air.  NSR per cardiac monitor.  Friend at bedside.  Tolerated dialysis well.

## 2018-03-16 NOTE — H&P (Addendum)
[facility] NAME: Phyllis [name]    MR#:  [id number]  DATE OF BIRTH:  28-Jan-1946  DATE OF ADMISSION:  03/16/2018  PRIMARY CARE PHYSICIAN: [name], MD   REQUESTING/REFERRING PHYSICIAN: Dr Charlesetta Ivory  CHIEF COMPLAINT:   Chief Complaint  Patient presents with  . Shortness of Breath    HISTORY OF PRESENT ILLNESS:  Phyllis [name]  is a 72 y.o. female with a known history of end-stage renal disease on dialysis 2 times a week on Tuesdays and Saturdays.  She states that she has been having shortness of breath since Friday afternoon.  No coughing or weight gain.  She states that she has been wheezing.  Shortness of breath had worsened and she was placed on BiPAP and currently on 60% FiO2 in the emergency room.  Hospitalist services were contacted for further evaluation.  Since the BiPAP with strength on she is having neck pain and shoulder pain.  She is asking for something for pain.  No complaints of chest pain.  PAST MEDICAL HISTORY:   Past Medical History:  Diagnosis Date  . Allergy   . Anemia of chronic disease   . Anxiety   . Aortic atherosclerosis ([place])   . Asthma   . Chronic back pain   . COPD (chronic obstructive pulmonary disease) ([place])   . Diabetes mellitus with complication ([place])   . ESRD on hemodialysis ([place])    a. Tues/Sat; b. 2/2 small kidneys  . Essential hypertension   . Fistula    lower left arm  . GERD (gastroesophageal reflux disease)   . Gout   . History of echocardiogram    a. TTE 01/2014: nl LV sys fxn, no valvular abnormalities; b. TTE 11/16: nl EF, mild LVH  . History of exercise stress test    a. 01/2014: no evidence of ischemia; b. Lexiscan 08/2015: no sig ischemia, severe GI uptake artifact, low risk; c. CPET @ [facility] 09/2016: exercised 3 min 12 sec on bike without incline, 2.28 METs, VO2 of 8.1, 48% of predicted, indicating mod to sev functional impairment, evidence of blunted  HR, stroke volume, and BP augmentation as well as ventilation-perfusion mismatch with exercise  . HLD (hyperlipidemia)   . 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 Renal insufficiency 09/24/2017   Dialysis patient.  . Sleep apnea     PAST SURGICAL HISTORY:   Past Surgical History:  Procedure Laterality Date  . carpel tunnel    . GALLBLADDER SURGERY    . PERIPHERAL VASCULAR CATHETERIZATION N/A 04/12/2015   Procedure: A/V Shuntogram/Fistulagram;  Surgeon: Algernon Huxley, MD;  Location: [facility];  Service: Cardiovascular;  Laterality: N/A;  . PERIPHERAL VASCULAR CATHETERIZATION N/A 04/12/2015   Procedure: A/V Shunt Intervention;  Surgeon: Algernon Huxley, MD;  Location: [facility];  Service: Cardiovascular;  Laterality: N/A;  . PERIPHERAL VASCULAR CATHETERIZATION N/A 06/09/2015   Procedure: Dialysis/Perma Catheter Removal;  Surgeon: Katha Cabal, MD;  Location: [facility];  Service: Cardiovascular;  Laterality: N/A;    SOCIAL HISTORY:   Social History   Tobacco Use  . Smoking status: Never Smoker  . Smokeless tobacco: Never Used  Substance Use Topics  . Alcohol use: No    FAMILY HISTORY:   Family History  Problem Relation Age of Onset  . Hypertension Mother   . Hyperlipidemia Mother   . Heart disease Father   . Heart attack  Father 58  . Hypertension Father   . Hyperlipidemia Father   . Heart disease Brother        CABG   . Heart attack Brother   . Breast cancer Neg Hx     DRUG ALLERGIES:   Allergies  Allergen Reactions  . Codeine Nausea And Vomiting and Nausea Only  . Enalapril Maleate     Other reaction(s): Headache  . Nitrofurantoin Swelling and Rash    Other Reaction: swelling of body  . Sulfamethoxazole-Trimethoprim Swelling  . Vicodin [Hydrocodone-Acetaminophen] Nausea And Vomiting and Nausea Only  . 2,4-D Dimethylamine (Amisol) Rash    Other Reaction: h/a  . Baclofen Other (See Comments) and Nausea Only     lightheadness ,drowsiness , muscle weakness , twitching in hands   . Neosporin [Neomycin-Bacitracin Zn-Polymyx] Other (See Comments) and Rash    Other Reaction: irritation Skin irritation   . Quinine Rash    Other Reaction: Vomiting rash, h/a, vision  . Ultram [Tramadol] Palpitations  . Zocor [Simvastatin] Other (See Comments) and Rash    Other Reaction: muscle spasms Muscle pain and spasms  . Bactrim [Sulfamethoxazole-Trimethoprim] Swelling  . Levodopa   . Macrodantin [Nitrofurantoin Macrocrystal] Swelling  . Quinine Derivatives Other (See Comments)    Vertigo,nausea vomiting blurred vision headache ears sensitivity   . Vasotec [Enalapril] Other (See Comments)    Headaches     REVIEW OF SYSTEMS:  CONSTITUTIONAL: No fever, fatigue or weakness.  Feels hot. EYES: Poor vision left eye EARS, NOSE, AND THROAT: No tinnitus or ear pain. No sore throat RESPIRATORY: No cough.  Positive for shortness of breath and wheezing.  No hemoptysis.  CARDIOVASCULAR: No chest pain, orthopnea, edema.  GASTROINTESTINAL: No nausea, vomiting, diarrhea or abdominal pain. No blood in bowel movements.  Positive for constipation  GENITOURINARY: No dysuria, hematuria.  ENDOCRINE: No polyuria, nocturia,  HEMATOLOGY: No anemia, easy bruising or bleeding SKIN: No rash or lesion. MUSCULOSKELETAL: No joint pain or arthritis.   NEUROLOGIC: No tingling, numbness, weakness.  PSYCHIATRY: No anxiety or depression.   MEDICATIONS AT HOME:   Prior to Admission medications   Medication Sig Start Date End Date Taking? Authorizing Provider  albuterol (PROVENTIL HFA;VENTOLIN HFA) 108 (90 Base) MCG/ACT inhaler Inhale 2 puffs into the lungs every 6 (six) hours as needed for wheezing or shortness of breath.    [provider]  amLODipine (NORVASC) 10 MG tablet Take 10 mg by mouth.  12/29/13   [provider]  aspirin EC 81 MG EC tablet Take 1 tablet (81 mg total) by mouth daily. 09/20/17   Bettey Costa,  MD  budesonide-formoterol (SYMBICORT) 160-4.5 MCG/ACT inhaler Inhale into the lungs. 01/25/18 01/25/19  [provider]  calcium acetate (PHOSLO) 667 MG capsule Take 667 mg by mouth 3 (three) times daily with meals.     [provider]  carvedilol (COREG) 12.5 MG tablet Take 12.5 mg by mouth 2 (two) times daily with a meal.  01/09/14   [provider]  cetirizine (ZYRTEC) 10 MG tablet Take 10 mg by mouth as needed for allergies.    [provider]  clopidogrel (PLAVIX) 75 MG tablet Take 1 tablet (75 mg total) by mouth daily. 01/18/18   Epifanio Lesches, MD  cyclobenzaprine (FLEXERIL) 10 MG tablet Take 10 mg by mouth 3 (three) times daily as needed for muscle spasms.    [provider]  docusate sodium (COLACE) 100 MG capsule Take 100 mg by mouth 3 (three) times daily as needed for mild  constipation.    [provider]  Fluticasone-Salmeterol (ADVAIR) 250-50 MCG/DOSE AEPB Inhale 1 puff into the lungs 2 (two) times daily.    [provider]  HYDROcodone-acetaminophen (NORCO) 5-325 MG tablet Take 1 tablet by mouth every 6 (six) hours as needed for moderate pain or severe pain. 08/16/17   Eula Listen, MD  lidocaine-prilocaine (EMLA) cream Apply 1 application topically as needed.    [provider]  nitroGLYCERIN (NITROSTAT) 0.4 MG SL tablet Place 1 tablet (0.4 mg total) under the tongue every 5 (five) minutes as needed for chest pain. 09/21/17   Strader, Fransisco Hertz, PA-C  omeprazole (PRILOSEC) 20 MG capsule Take 20 mg by mouth 2 (two) times daily before a meal.    [provider]  ondansetron (ZOFRAN) 4 MG tablet Take 4 mg by mouth every 8 (eight) hours as needed for nausea or vomiting.    [provider]  oxybutynin (DITROPAN XL) 15 MG 24 hr tablet Take 15 mg by mouth daily. 12/24/17   [provider]  pravastatin (PRAVACHOL) 40 MG tablet Take 40 mg by mouth daily.  12/29/13   [provider]   senna (SENOKOT) 8.6 MG tablet Take 1 tablet by mouth daily.    [provider]  topiramate (TOPAMAX) 25 MG tablet Take 25 mg by mouth as needed.  01/14/14   [provider]  VESICARE 10 MG tablet  12/15/17   [provider]  Vitamin D, Ergocalciferol, (DRISDOL) 50000 UNITS CAPS capsule Take 50,000 Units by mouth every Wednesday.  12/29/13   [provider]      VITAL SIGNS:  Blood pressure (!) 156/104, pulse 96, temperature 98.4 F (36.9 C), temperature source Oral, resp. rate (!) 22, height 4\' 10"  (1.473 m), weight 60.8 kg (134 lb), SpO2 100 %.  PHYSICAL EXAMINATION:  GENERAL:  72 y.o.-year-old patient lying in the bed with with acute distress.  EYES: Pupils equal, round, reactive to light and accommodation. No scleral icterus. Extraocular muscles intact.  HEENT: Head atraumatic, normocephalic. Oropharynx and nasopharynx clear.  NECK:  Supple, no jugular venous distention. No thyroid enlargement, no tenderness.  LUNGS: Decreased breath sounds bilaterally, upper airway expiratory wheezing.  Lower airway rales.  Positive use of accessory muscles of respiration.  CARDIOVASCULAR: S1, S2 normal. No murmurs, rubs, or gallops.  ABDOMEN: Soft, nontender, nondistended. Bowel sounds present. No organomegaly or mass.  EXTREMITIES: Trace pedal edema, no cyanosis, or clubbing.  NEUROLOGIC: Cranial nerves II through XII are intact. Muscle strength 5/5 in all extremities. Sensation intact. Gait not checked.  PSYCHIATRIC: The patient is alert and oriented x 3.  SKIN: No rash, lesion, or ulcer.   LABORATORY PANEL:   CBC Recent Labs  Lab 03/16/18 0208  WBC 7.2  HGB 10.4*  HCT 30.5*  PLT 187   ------------------------------------------------------------------------------------------------------------------  Chemistries  Recent Labs  Lab 03/16/18 0208  NA 137  K 4.8  CL 101  CO2 25  GLUCOSE 120*  BUN 46*  CREATININE 6.73*  CALCIUM 8.6*    ------------------------------------------------------------------------------------------------------------------  Cardiac Enzymes Recent Labs  Lab 03/16/18 0208  TROPONINI 0.03*   ------------------------------------------------------------------------------------------------------------------  RADIOLOGY:  Dg Chest 2 View  Result Date: 03/16/2018 CLINICAL DATA:  Dyspnea x1 day with chest pain EXAM: CHEST - 2 VIEW COMPARISON:  01/16/2018 CXR FINDINGS: Stable cardiomegaly with tortuous atherosclerotic aorta. Diffuse increase in interstitial lung markings consistent with interstitial edema. No alveolar consolidation, effusion or pneumothorax. Slight blunting of the posterior costophrenic angles likely related to small pleural effusions.  IMPRESSION: 1. Stable cardiomegaly with tortuous atherosclerotic aorta. No aneurysm. 2. Diffuse interstitial edema with small posterior pleural effusions. Electronically Signed   By: Ashley Royalty M.D.   On: 03/16/2018 02:45    EKG:   Normal sinus rhythm 94 bpm.  No acute ST-T wave changes.  IMPRESSION AND PLAN:   1.  Acute hypoxic respiratory failure.  Patient was placed on BiPAP in the emergency room and currently on 60% FiO2.  Will admit the patient to the CCU stepdown for close monitoring of respiratory status.  Continue to monitor respiratory status and pulse ox in order to taper down the amount of oxygen.  Hopefully after dialysis will be able to get off the BiPAP. 2.  Acute diastolic congestive heart failure.  Patient also has mild to moderate aortic stenosis.  Patient has an allergy to ACE inhibitor and this is also contraindicated with those patients with aortic stenosis.  Patient is on beta-blocker.  Increase the dose of Lasix 120 mg this morning.  Dialysis to manage fluid.  Oral Lasix for tomorrow. 3.  Possibility of COPD exacerbation also.  Start steroids and nebulizer treatments. 4.  Accelerated hypertension likely secondary to respiratory  distress.  Continue usual medications. 5.  GERD on PPI 6.  Hyperlipidemia unspecified on pravastatin 7.  End-stage renal disease on dialysis only twice a week.  Spoke with Dr. Rolly Salter and potentially they can increase this to 3 times a week. 8.  Neck pain and right shoulder pain.  Steroid should help.  1 dose of Toradol.  Her usual chronic pain medication ordered.    All the records are reviewed and case discussed with ED provider. Management plans discussed with the patient, family and they are in agreement.  CODE STATUS: Full code discussed.  TOTAL TIME TAKING CARE OF THIS PATIENT: 50 minutes.  Case discussed with nephrology to start dialysis.  Case discussed with critical care specialist to watch respiratory status closely.      Loletha Grayer M.D on 03/16/2018 at 8:02 AM  Between 7am to 6pm - Pager - [phone]  After 6pm call admission pager [phone]  [facility] Office  [phone]  CC: Primary care physician; [name], MD

## 2018-03-16 NOTE — ED Notes (Signed)
Admitting MD at bedside at this time.

## 2018-03-16 NOTE — ED Notes (Signed)
Date and time results received: 03/16/18 02:42 (use smartphrase ".now" to insert current time)  Test: troponin Critical Value: 0.03  Name of Provider Notified: Dr. Dahlia Client  Orders Received? Or Actions Taken?: Acknowledged

## 2018-03-16 NOTE — ED Notes (Signed)
RT called to adjust %O2 and mask

## 2018-03-16 NOTE — ED Provider Notes (Signed)
[facility] Emergency Department Provider Note   ____________________________________________   First MD Initiated Contact with Patient 03/16/18 0315     (approximate)  I have reviewed the triage vital signs and the nursing notes.   HISTORY  Chief Complaint Shortness of Breath    HPI Phyllis [name] is a 72 y.o. female who comes into the hospital today with some shortness of breath.  The patient states that she has had shortness of breath and wheezing since yesterday afternoon.  She has a history of end-stage renal disease on dialysis.  She reports that the symptoms got better but then got worse again this evening.  She reports that she used her pro-air inhaler for her shortness of breath and she is due for dialysis today at noon.  She is had a nonproductive cough and chest tightness.  The patient reports though that she kept feeling as though she could not breathe so she decided to come into the hospital for evaluation.  She has not had any fevers, nausea, vomiting.   Past Medical History:  Diagnosis Date  . Allergy   . Anemia of chronic disease   . Anxiety   . Aortic atherosclerosis ([place])   . Asthma   . Chronic back pain   . COPD (chronic obstructive pulmonary disease) ([place])   . Diabetes mellitus with complication ([place])   . ESRD on hemodialysis ([place])    a. Tues/Sat; b. 2/2 small kidneys  . Essential hypertension   . Fistula    lower left arm  . GERD (gastroesophageal reflux disease)   . Gout   . History of echocardiogram    a. TTE 01/2014: nl LV sys fxn, no valvular abnormalities; b. TTE 11/16: nl EF, mild LVH  . History of exercise stress test    a. 01/2014: no evidence of ischemia; b. Lexiscan 08/2015: no sig ischemia, severe GI uptake artifact, low risk; c. CPET @ [facility] 09/2016: exercised 3 min 12 sec on bike without incline, 2.28 METs, VO2 of 8.1, 48% of predicted, indicating mod to sev functional impairment, evidence of blunted HR, stroke  volume, and BP augmentation as well as ventilation-perfusion mismatch with exercise  . HLD (hyperlipidemia)   . 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 Renal insufficiency 09/24/2017   Dialysis patient.  . Sleep apnea     Patient Active Problem List   Diagnosis Date Noted  . CVA (cerebral vascular accident) ([place]) 01/16/2018  . Acute respiratory failure ([place]) 01/16/2018  . Aortic atherosclerosis ([place]) 12/11/2017  . Chest pain 09/18/2017  . Chest discomfort 08/06/2015  . Hyperlipidemia 08/06/2015  . Complication from renal dialysis device 04/12/2015  . SOB (shortness of breath) 02/01/2015  . End stage renal disease ([place]) 02/01/2015  . Dependence on hemodialysis ([place]) 02/01/2015  . Type 2 diabetes mellitus with other specified complication ([place]) 52/77/8242  . Asthma 02/01/2015  . Chronic diastolic CHF (congestive heart failure) ([place]) 02/01/2015    Past Surgical History:  Procedure Laterality Date  . carpel tunnel    . GALLBLADDER SURGERY    . PERIPHERAL VASCULAR CATHETERIZATION N/A 04/12/2015   Procedure: A/V Shuntogram/Fistulagram;  Surgeon: Algernon Huxley, MD;  Location: [facility];  Service: Cardiovascular;  Laterality: N/A;  . PERIPHERAL VASCULAR CATHETERIZATION N/A 04/12/2015   Procedure: A/V Shunt Intervention;  Surgeon: Algernon Huxley, MD;  Location: [facility];  Service: Cardiovascular;  Laterality: N/A;  . PERIPHERAL VASCULAR CATHETERIZATION N/A 06/09/2015  Procedure: Dialysis/Perma Catheter Removal;  Surgeon: Katha Cabal, MD;  Location: [facility];  Service: Cardiovascular;  Laterality: N/A;    Prior to Admission medications   Medication Sig Start Date End Date Taking? Authorizing Provider  albuterol (PROVENTIL HFA;VENTOLIN HFA) 108 (90 Base) MCG/ACT inhaler Inhale 2 puffs into the lungs every 6 (six) hours as needed for wheezing or shortness of breath.    [provider]  amLODipine (NORVASC) 10 MG tablet Take  10 mg by mouth.  12/29/13   [provider]  aspirin EC 81 MG EC tablet Take 1 tablet (81 mg total) by mouth daily. 09/20/17   Bettey Costa, MD  budesonide-formoterol (SYMBICORT) 160-4.5 MCG/ACT inhaler Inhale into the lungs. 01/25/18 01/25/19  [provider]  calcium acetate (PHOSLO) 667 MG capsule Take 667 mg by mouth 3 (three) times daily with meals.     [provider]  carvedilol (COREG) 12.5 MG tablet Take 12.5 mg by mouth 2 (two) times daily with a meal.  01/09/14   [provider]  cetirizine (ZYRTEC) 10 MG tablet Take 10 mg by mouth as needed for allergies.    [provider]  clopidogrel (PLAVIX) 75 MG tablet Take 1 tablet (75 mg total) by mouth daily. 01/18/18   Epifanio Lesches, MD  cyclobenzaprine (FLEXERIL) 10 MG tablet Take 10 mg by mouth 3 (three) times daily as needed for muscle spasms.    [provider]  docusate sodium (COLACE) 100 MG capsule Take 100 mg by mouth 3 (three) times daily as needed for mild constipation.    [provider]  Fluticasone-Salmeterol (ADVAIR) 250-50 MCG/DOSE AEPB Inhale 1 puff into the lungs 2 (two) times daily.    [provider]  HYDROcodone-acetaminophen (NORCO) 5-325 MG tablet Take 1 tablet by mouth every 6 (six) hours as needed for moderate pain or severe pain. 08/16/17   Eula Listen, MD  lidocaine-prilocaine (EMLA) cream Apply 1 application topically as needed.    [provider]  nitroGLYCERIN (NITROSTAT) 0.4 MG SL tablet Place 1 tablet (0.4 mg total) under the tongue every 5 (five) minutes as needed for chest pain. 09/21/17   Strader, Fransisco Hertz, PA-C  omeprazole (PRILOSEC) 20 MG capsule Take 20 mg by mouth 2 (two) times daily before a meal.    [provider]  ondansetron (ZOFRAN) 4 MG tablet Take 4 mg by mouth every 8 (eight) hours as needed for nausea or vomiting.    [provider]  oxybutynin (DITROPAN XL) 15 MG 24 hr tablet Take 15 mg by mouth  daily. 12/24/17   [provider]  pravastatin (PRAVACHOL) 40 MG tablet Take 40 mg by mouth daily.  12/29/13   [provider]  senna (SENOKOT) 8.6 MG tablet Take 1 tablet by mouth daily.    [provider]  topiramate (TOPAMAX) 25 MG tablet Take 25 mg by mouth as needed.  01/14/14   [provider]  VESICARE 10 MG tablet  12/15/17   [provider]  Vitamin D, Ergocalciferol, (DRISDOL) 50000 UNITS CAPS capsule Take 50,000 Units by mouth every Wednesday.  12/29/13   [provider]    Allergies Codeine; Enalapril maleate; Nitrofurantoin; Sulfamethoxazole-trimethoprim; Vicodin [hydrocodone-acetaminophen]; 2,4-d dimethylamine (amisol); Baclofen; Neosporin [neomycin-bacitracin zn-polymyx]; Quinine; Ultram [tramadol]; Zocor [simvastatin]; Bactrim [sulfamethoxazole-trimethoprim]; Levodopa; Macrodantin [nitrofurantoin macrocrystal]; Quinine derivatives; and Vasotec [enalapril]  Family History  Problem Relation Age of Onset  . Hypertension Mother   . Hyperlipidemia Mother   . Heart disease Father   .  Heart attack Father 72  . Hypertension Father   . Hyperlipidemia Father   . Heart disease Brother        CABG   . Heart attack Brother   . Breast cancer Neg Hx     Social History Social History   Tobacco Use  . Smoking status: Never Smoker  . Smokeless tobacco: Never Used  Substance Use Topics  . Alcohol use: No  . Drug use: No    Review of Systems  Constitutional: No fever/chills Eyes: No visual changes. ENT: No sore throat. Cardiovascular: Chest tightness Respiratory:  shortness of breath. Gastrointestinal: No abdominal pain.  No nausea, no vomiting.  No diarrhea.  No constipation. Genitourinary: Negative for dysuria. Musculoskeletal: Negative for back pain. Skin: Negative for rash. Neurological: Negative for headaches, focal weakness or numbness.   ____________________________________________   PHYSICAL EXAM:  VITAL  SIGNS: ED Triage Vitals  Enc Vitals Group     BP 03/16/18 0204 (!) 166/59     Pulse Rate 03/16/18 0204 90     Resp 03/16/18 0204 20     Temp 03/16/18 0204 98.4 F (36.9 C)     Temp Source 03/16/18 0204 Oral     SpO2 03/16/18 0204 98 %     Weight 03/16/18 0201 134 lb (60.8 kg)     Height 03/16/18 0201 4\' 10"  (1.473 m)     Head Circumference --      Peak Flow --      Pain Score 03/16/18 0201 0     Pain Loc --      Pain Edu? --      Excl. in [place]? --     Constitutional: Alert and oriented. Well appearing and in moderate to severe distress. Eyes: Conjunctivae are normal. PERRL. EOMI. Head: Atraumatic. Nose: No congestion/rhinnorhea. Mouth/Throat: Mucous membranes are moist.  Oropharynx non-erythematous. Cardiovascular: Normal rate, regular rhythm. Grossly normal heart sounds.  Good peripheral circulation. Respiratory: Increased respiratory effort.  subcostal retractions. Rales heard in all lung fields and some moderate wheezing. Gastrointestinal: Soft and nontender. No distention.  Positive bowel sounds Musculoskeletal: bilateral lower extremity pitting edema Neurologic:  Normal speech and language.  Skin:  Skin is warm, dry and intact.  Psychiatric: Patient anxious   ____________________________________________   LABS (all labs ordered are listed, but only abnormal results are displayed)  Labs Reviewed  BASIC METABOLIC PANEL - Abnormal; Notable for the following components:      Result Value   Glucose, Bld 120 (*)    BUN 46 (*)    Creatinine, Ser 6.73 (*)    Calcium 8.6 (*)    GFR calc non Af Amer 6 (*)    GFR calc Af Amer 6 (*)    All other components within normal limits  CBC - Abnormal; Notable for the following components:   RBC 3.44 (*)    Hemoglobin 10.4 (*)    HCT 30.5 (*)    RDW 15.7 (*)    All other components within normal limits  TROPONIN I - Abnormal; Notable for the following components:   Troponin I 0.03 (*)    All other components within normal limits   URINALYSIS, COMPLETE (UACMP) WITH MICROSCOPIC - Abnormal; Notable for the following components:   Color, Urine STRAW (*)    APPearance CLEAR (*)    Glucose, UA 50 (*)    Protein, ur 100 (*)    Leukocytes, UA SMALL (*)    WBC, UA >50 (*)    Bacteria, UA  RARE (*)    All other components within normal limits   ____________________________________________  EKG  ED ECG REPORT I, Loney Hering, the attending physician, personally viewed and interpreted this ECG.   Date: 03/16/2018  EKG Time: 204  Rate: 94  Rhythm: normal sinus rhythm  Axis: normal  Intervals:none  ST&T Change: none  ____________________________________________  RADIOLOGY  ED MD interpretation:  CXR: Stable cardiomegaly with tortuous atherosclerotic aorta, No aneurysm. Diffuse interstitial edema with small posterior pleural effusions  Official radiology report(s): Dg Chest 2 View  Result Date: 03/16/2018 CLINICAL DATA:  Dyspnea x1 day with chest pain EXAM: CHEST - 2 VIEW COMPARISON:  01/16/2018 CXR FINDINGS: Stable cardiomegaly with tortuous atherosclerotic aorta. Diffuse increase in interstitial lung markings consistent with interstitial edema. No alveolar consolidation, effusion or pneumothorax. Slight blunting of the posterior costophrenic angles likely related to small pleural effusions. IMPRESSION: 1. Stable cardiomegaly with tortuous atherosclerotic aorta. No aneurysm. 2. Diffuse interstitial edema with small posterior pleural effusions. Electronically Signed   By: Ashley Royalty M.D.   On: 03/16/2018 02:45    ____________________________________________   PROCEDURES  Procedure(s) performed: please, see procedure note(s).  .Critical Care Performed by: Loney Hering, MD Authorized by: Loney Hering, MD   Critical care provider statement:    Critical care time (minutes):  30   Critical care start time:  03/16/2018 3:15 AM   Critical care end time:  03/16/2018 3:45 AM   Critical care time  was exclusive of:  Separately billable procedures and treating other patients   Critical care was necessary to treat or prevent imminent or life-threatening deterioration of the following conditions:  Respiratory failure   Critical care was time spent personally by me on the following activities:  Development of treatment plan with patient or surrogate, evaluation of patient's response to treatment, examination of patient, obtaining history from patient or surrogate, ordering and performing treatments and interventions, ordering and review of laboratory studies, ordering and review of radiographic studies, pulse oximetry, re-evaluation of patient's condition and review of old charts   I assumed direction of critical care for this patient from another provider in my specialty: no      Critical Care performed: Yes, see critical care note(s)  ____________________________________________   INITIAL IMPRESSION / ASSESSMENT AND PLAN / ED COURSE  As part of my medical decision making, I reviewed the following data within the electronic MEDICAL RECORD NUMBER Notes from prior ED visits and [employer] Controlled Substance Database   This is a 72 year old female who comes into the hospital today with some shortness of breath.  I walked into the room and the patient did have some wheezing heard across the room.  As I was talking with the patient she she developed more more respiratory distress.  I did order a DuoNeb for the patient but by the time she received a DuoNeb she was in more distress and having desaturations. My concern is that the patient was in flash pulmonary edema.  She had a chest x-ray that showed some edema previously and she is due for dialysis.  We contacted respiratory to have the patient placed on BiPAP.  The patient had some blood work which was unremarkable.  Her creatinine is 6.73 but that is reasonable given her history of dialysis dependence.  We placed the patient on BiPAP and she became more  somnolent and continued to struggle.  I made the decision that I would intubate the patient and did start assisting her ventilations with  bag valve mask.  As I assisted her ventilations the patient's breathing did improve.  Her oxygen levels increased to 100% and she became more alert.  We then attempted the BiPAP again and the patient was able to breathing higher volumes than previous.  The patient had a Nitropaste placed to her chest and was given 40 mg of Lasix IV as she does still make urine.  I did contact the hospitalist to admit the patient.  She will be admitted to the stepdown unit.      ____________________________________________   FINAL CLINICAL IMPRESSION(S) / ED DIAGNOSES  Final diagnoses:  Shortness of breath  Acute pulmonary edema [facility])     ED Discharge Orders    None       Note:  This document was prepared using Dragon voice recognition software and may include unintentional dictation errors.    Loney Hering, MD 03/16/18 [phone]

## 2018-03-16 NOTE — Consult Note (Signed)
[facility] Consultation      Name: Phyllis [name] MRN: [id number] DOB: August 16, 1946    ADMISSION DATE:  03/16/2018 CONSULTATION DATE:  03/16/2018  REFERRING MD : Loletha Grayer, MD   CHIEF COMPLAINT:    Dyspnoea   HISTORY OF PRESENT ILLNESS    Phyllis [name]  is a 72 y.o. female with a known history of end-stage renal disease on dialysis 2 times a week on Tuesdays and Saturdays.  She states that she has been having shortness of breath since Friday afternoon.  No coughing or weight gain.  She states that she has been wheezing.  Shortness of breath had worsened and she was placed on BiPAP and currently on 60% FiO2 in the emergency room.  Hospitalist services were contacted for further evaluation. Patient complained of right neck pain and shoulder pain soon after BIPAP was placed..  She denied chest pain.     SIGNIFICANT EVENTS   4/27 Admission to ICU    PAST MEDICAL HISTORY    :  Past Medical History:  Diagnosis Date  . Allergy   . Anemia of chronic disease   . Anxiety   . Aortic atherosclerosis ([place])   . Asthma   . Chronic back pain   . COPD (chronic obstructive pulmonary disease) ([place])   . Diabetes mellitus with complication ([place])   . ESRD on hemodialysis ([place])    a. Tues/Sat; b. 2/2 small kidneys  . Essential hypertension   . Fistula    lower left arm  . GERD (gastroesophageal reflux disease)   . Gout   . History of echocardiogram    a. TTE 01/2014: nl LV sys fxn, no valvular abnormalities; b. TTE 11/16: nl EF, mild LVH  . History of exercise stress test    a. 01/2014: no evidence of ischemia; b. Lexiscan 08/2015: no sig ischemia, severe GI uptake artifact, low risk; c. CPET @ [facility] 09/2016: exercised 3 min 12 sec on bike without incline, 2.28 METs, VO2 of 8.1, 48% of predicted, indicating mod to sev functional impairment, evidence of blunted HR, stroke volume, and BP augmentation as well as ventilation-perfusion mismatch with exercise  .  HLD (hyperlipidemia)   . 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 Renal insufficiency 09/24/2017   Dialysis patient.  . Sleep apnea    Past Surgical History:  Procedure Laterality Date  . carpel tunnel    . GALLBLADDER SURGERY    . PERIPHERAL VASCULAR CATHETERIZATION N/A 04/12/2015   Procedure: A/V Shuntogram/Fistulagram;  Surgeon: Algernon Huxley, MD;  Location: [facility];  Service: Cardiovascular;  Laterality: N/A;  . PERIPHERAL VASCULAR CATHETERIZATION N/A 04/12/2015   Procedure: A/V Shunt Intervention;  Surgeon: Algernon Huxley, MD;  Location: [facility];  Service: Cardiovascular;  Laterality: N/A;  . PERIPHERAL VASCULAR CATHETERIZATION N/A 06/09/2015   Procedure: Dialysis/Perma Catheter Removal;  Surgeon: Katha Cabal, MD;  Location: [facility];  Service: Cardiovascular;  Laterality: N/A;   Prior to Admission medications   Medication Sig Start Date End Date Taking? Authorizing Provider  albuterol (PROVENTIL HFA;VENTOLIN HFA) 108 (90 Base) MCG/ACT inhaler Inhale 2 puffs into the lungs every 6 (six) hours as needed for wheezing or shortness of breath.    [provider]  amLODipine (NORVASC) 10 MG tablet Take 10 mg by mouth.  12/29/13   [provider]  aspirin EC 81 MG EC tablet Take 1 tablet (81 mg total) by mouth daily.  09/20/17   Bettey Costa, MD  budesonide-formoterol (SYMBICORT) 160-4.5 MCG/ACT inhaler Inhale into the lungs. 01/25/18 01/25/19  [provider]  calcium acetate (PHOSLO) 667 MG capsule Take 667 mg by mouth 3 (three) times daily with meals.     [provider]  carvedilol (COREG) 12.5 MG tablet Take 12.5 mg by mouth 2 (two) times daily with a meal.  01/09/14   [provider]  cetirizine (ZYRTEC) 10 MG tablet Take 10 mg by mouth as needed for allergies.    [provider]  clopidogrel (PLAVIX) 75 MG tablet Take 1 tablet (75 mg total) by mouth daily. 01/18/18   Epifanio Lesches,  MD  cyclobenzaprine (FLEXERIL) 10 MG tablet Take 10 mg by mouth 3 (three) times daily as needed for muscle spasms.    [provider]  docusate sodium (COLACE) 100 MG capsule Take 100 mg by mouth 3 (three) times daily as needed for mild constipation.    [provider]  Fluticasone-Salmeterol (ADVAIR) 250-50 MCG/DOSE AEPB Inhale 1 puff into the lungs 2 (two) times daily.    [provider]  HYDROcodone-acetaminophen (NORCO) 5-325 MG tablet Take 1 tablet by mouth every 6 (six) hours as needed for moderate pain or severe pain. 08/16/17   Eula Listen, MD  lidocaine-prilocaine (EMLA) cream Apply 1 application topically as needed.    [provider]  nitroGLYCERIN (NITROSTAT) 0.4 MG SL tablet Place 1 tablet (0.4 mg total) under the tongue every 5 (five) minutes as needed for chest pain. 09/21/17   Strader, Fransisco Hertz, PA-C  omeprazole (PRILOSEC) 20 MG capsule Take 20 mg by mouth 2 (two) times daily before a meal.    [provider]  ondansetron (ZOFRAN) 4 MG tablet Take 4 mg by mouth every 8 (eight) hours as needed for nausea or vomiting.    [provider]  oxybutynin (DITROPAN XL) 15 MG 24 hr tablet Take 15 mg by mouth daily. 12/24/17   [provider]  pravastatin (PRAVACHOL) 40 MG tablet Take 40 mg by mouth daily.  12/29/13   [provider]  senna (SENOKOT) 8.6 MG tablet Take 1 tablet by mouth daily.    [provider]  topiramate (TOPAMAX) 25 MG tablet Take 25 mg by mouth as needed.  01/14/14   [provider]  VESICARE 10 MG tablet  12/15/17   [provider]  Vitamin D, Ergocalciferol, (DRISDOL) 50000 UNITS CAPS capsule Take 50,000 Units by mouth every Wednesday.  12/29/13   [provider]   Allergies  Allergen Reactions  . Codeine Nausea And Vomiting and Nausea Only  . Enalapril Maleate     Other reaction(s): Headache  . Nitrofurantoin Swelling and Rash    Other Reaction: swelling of  body  . Sulfamethoxazole-Trimethoprim Swelling  . Vicodin [Hydrocodone-Acetaminophen] Nausea And Vomiting and Nausea Only  . 2,4-D Dimethylamine (Amisol) Rash    Other Reaction: h/a  . Baclofen Other (See Comments) and Nausea Only    lightheadness ,drowsiness , muscle weakness , twitching in hands   . Neosporin [Neomycin-Bacitracin Zn-Polymyx] Other (See Comments) and Rash    Other Reaction: irritation Skin irritation   . Quinine Rash    Other Reaction: Vomiting rash, h/a, vision  . Ultram [Tramadol] Palpitations  . Zocor [Simvastatin] Other (See Comments) and Rash    Other Reaction: muscle spasms Muscle pain and spasms  . Bactrim [Sulfamethoxazole-Trimethoprim] Swelling  . Levodopa   . Macrodantin [Nitrofurantoin Macrocrystal] Swelling  . Quinine Derivatives Other (See Comments)  Vertigo,nausea vomiting blurred vision headache ears sensitivity   . Vasotec [Enalapril] Other (See Comments)    Headaches      FAMILY HISTORY   Family History  Problem Relation Age of Onset  . Hypertension Mother   . Hyperlipidemia Mother   . Heart disease Father   . Heart attack Father 75  . Hypertension Father   . Hyperlipidemia Father   . Heart disease Brother        CABG   . Heart attack Brother   . Breast cancer Neg Hx       SOCIAL HISTORY    reports that she has never smoked. She has never used smokeless tobacco. She reports that she does not drink alcohol or use drugs.   CONSTITUTIONAL: No fever, fatigue or weakness.  Feels hot. EYES: Poor vision left eye EARS, NOSE, AND THROAT: No tinnitus or ear pain. No sore throat RESPIRATORY: No cough.  Positive for shortness of breath and wheezing.  No hemoptysis.  CARDIOVASCULAR: No chest pain, orthopnea, edema.  GASTROINTESTINAL: No nausea, vomiting, diarrhea or abdominal pain. No blood in bowel movements.  Positive for constipation  GENITOURINARY: No dysuria, hematuria.  ENDOCRINE: No polyuria, nocturia,  HEMATOLOGY: No anemia,  easy bruising or bleeding SKIN: No rash or lesion. MUSCULOSKELETAL: No joint pain or arthritis.   NEUROLOGIC: No tingling, numbness, weakness.  PSYCHIATRY: No anxiety or depression.       VITAL SIGNS    Temp:  [98 F (36.7 C)-99.3 F (37.4 C)] 99.3 F (37.4 C) (04/27 1700) Pulse Rate:  [81-112] 92 (04/27 1800) Resp:  [14-39] 15 (04/27 1800) BP: (127-200)/(50-104) 140/72 (04/27 1800) SpO2:  [75 %-100 %] 100 % (04/27 1800) FiO2 (%):  [30 %-100 %] 30 % (04/27 0946) Weight:  [131 lb 9.8 oz (59.7 kg)-134 lb (60.8 kg)] 131 lb 9.8 oz (59.7 kg) (04/27 1030) HEMODYNAMICS:   VENTILATOR SETTINGS: FiO2 (%):  [30 %-100 %] 30 % INTAKE / OUTPUT:  Intake/Output Summary (Last 24 hours) at 03/16/2018 1853 Last data filed at 03/16/2018 1330 Gross per 24 hour  Intake -  Output 2300 ml  Net -2300 ml       PHYSICAL EXAM    GENERAL:  72 y.o.-year-old patient lying in the bed with with acute distress.  EYES: Pupils equal, round, reactive to light and accommodation. No scleral icterus. Extraocular muscles intact.  HEENT: Head atraumatic, normocephalic. Oropharynx and nasopharynx clear.  NECK:  Supple, no jugular venous distention. No thyroid enlargement, no tenderness.  LUNGS: Decreased breath sounds bilaterally, upper airway expiratory wheezing.  Lower airway rales.  Positive use of accessory muscles of respiration. On BIPAP CARDIOVASCULAR: S1, S2 normal. No murmurs, rubs, or gallops.  ABDOMEN: Soft, nontender, nondistended. Bowel sounds present. No organomegaly or mass.  EXTREMITIES: Trace pedal edema, no cyanosis, or clubbing.  NEUROLOGIC: Cranial nerves II through XII are intact. Muscle strength 5/5 in all extremities. Sensation intact. Gait not checked.  PSYCHIATRIC: The patient is alert and oriented x 3.  SKIN: No rash, lesion, or ulcer.      LABS   LABS:  CBC Recent Labs  Lab 03/16/18 0208  WBC 7.2  HGB 10.4*  HCT 30.5*  PLT 187   Coag's No results for input(s):  APTT, INR in the last 168 hours. BMET Recent Labs  Lab 03/16/18 0208  NA 137  K 4.8  CL 101  CO2 25  BUN 46*  CREATININE 6.73*  GLUCOSE 120*   Electrolytes Recent Labs  Lab 03/16/18 0208  CALCIUM 8.6*   Sepsis Markers No results for input(s): LATICACIDVEN, PROCALCITON, O2SATVEN in the last 168 hours. ABG No results for input(s): PHART, PCO2ART, PO2ART in the last 168 hours. Liver Enzymes No results for input(s): AST, ALT, ALKPHOS, BILITOT, ALBUMIN in the last 168 hours. Cardiac Enzymes Recent Labs  Lab 03/16/18 0208  TROPONINI 0.03*   Glucose Recent Labs  Lab 03/16/18 0944  GLUCAP 112*     Recent Results (from the past 240 hour(s))  MRSA PCR Screening     Status: None   Collection Time: 03/16/18 10:00 AM  Result Value Ref Range Status   MRSA by PCR NEGATIVE NEGATIVE Final    Comment:        The GeneXpert MRSA Assay (FDA approved for NASAL specimens only), is one component of a comprehensive MRSA colonization surveillance program. It is not intended to diagnose MRSA infection nor to guide or monitor treatment for MRSA infections. Performed at [facility], [address] [place], [place] [address]      Current Facility-Administered Medications:  .  acetaminophen (TYLENOL) tablet 650 mg, 650 mg, Oral, Q6H PRN **OR** acetaminophen (TYLENOL) suppository 650 mg, 650 mg, Rectal, Q6H PRN, Wieting, Richard, MD .  amLODipine (NORVASC) tablet 10 mg, 10 mg, Oral, Daily, Leslye Peer, Richard, MD, 10 mg at 03/16/18 1537 .  aspirin EC tablet 81 mg, 81 mg, Oral, Daily, Loletha Grayer, MD, 81 mg at 03/16/18 1135 .  budesonide (PULMICORT) nebulizer solution 0.5 mg, 0.5 mg, Nebulization, BID, Leslye Peer, Richard, MD, 0.5 mg at 03/16/18 0959 .  calcium acetate (PHOSLO) capsule 667 mg, 667 mg, Oral, TID WC, Loletha Grayer, MD, 667 mg at 03/16/18 1800 .  carvedilol (COREG) tablet 12.5 mg, 12.5 mg, Oral, BID WC, Wieting, Richard, MD, 12.5 mg at 03/16/18 1539 .   clopidogrel (PLAVIX) tablet 75 mg, 75 mg, Oral, Daily, Leslye Peer, Richard, MD, 75 mg at 03/16/18 1135 .  cyclobenzaprine (FLEXERIL) tablet 10 mg, 10 mg, Oral, TID PRN, Leslye Peer, Richard, MD .  docusate sodium (COLACE) capsule 100 mg, 100 mg, Oral, TID PRN, Loletha Grayer, MD .  Derrill Memo ON 03/17/2018] furosemide (LASIX) tablet 80 mg, 80 mg, Oral, Daily, Wieting, Richard, MD .  heparin injection 5,000 Units, 5,000 Units, Subcutaneous, Q8H, Loletha Grayer, MD, 5,000 Units at 03/16/18 1537 .  HYDROcodone-acetaminophen (NORCO/VICODIN) 5-325 MG per tablet 1 tablet, 1 tablet, Oral, Q6H PRN, Loletha Grayer, MD, 1 tablet at 03/16/18 1136 .  ipratropium-albuterol (DUONEB) 0.5-2.5 (3) MG/3ML nebulizer solution 3 mL, 3 mL, Nebulization, Q6H, Wieting, Richard, MD, 3 mL at 03/16/18 1310 .  lidocaine-prilocaine (EMLA) cream 1 application, 1 application, Topical, PRN, Wieting, Richard, MD .  loratadine (CLARITIN) tablet 10 mg, 10 mg, Oral, Daily, Leslye Peer, Richard, MD, 10 mg at 03/16/18 1136 .  methylPREDNISolone sodium succinate (SOLU-MEDROL) 40 mg/mL injection 40 mg, 40 mg, Intravenous, Q12H, Loletha Grayer, MD, 40 mg at 03/16/18 0903 .  nitroGLYCERIN (NITROSTAT) SL tablet 0.4 mg, 0.4 mg, Sublingual, Q5 min PRN, Wieting, Richard, MD .  ondansetron (ZOFRAN) tablet 4 mg, 4 mg, Oral, Q8H PRN, Wieting, Richard, MD .  oxybutynin (DITROPAN-XL) 24 hr tablet 15 mg, 15 mg, Oral, Daily, Wieting, Richard, MD, 15 mg at 03/16/18 1141 .  pantoprazole (PROTONIX) EC tablet 40 mg, 40 mg, Oral, Daily, Leslye Peer, Richard, MD, 40 mg at 03/16/18 1136 .  pravastatin (PRAVACHOL) tablet 40 mg, 40 mg, Oral, Daily, Leslye Peer, Richard, MD, 40 mg at 03/16/18 1135 .  senna (SENOKOT) tablet 8.6 mg, 1 tablet, Oral, Daily, Loletha Grayer, MD  IMAGING    Dg Chest 2 View  Result Date: 03/16/2018 CLINICAL DATA:  Dyspnea x1 day with chest pain EXAM: CHEST - 2 VIEW COMPARISON:  01/16/2018 CXR FINDINGS: Stable cardiomegaly with tortuous  atherosclerotic aorta. Diffuse increase in interstitial lung markings consistent with interstitial edema. No alveolar consolidation, effusion or pneumothorax. Slight blunting of the posterior costophrenic angles likely related to small pleural effusions. IMPRESSION: 1. Stable cardiomegaly with tortuous atherosclerotic aorta. No aneurysm. 2. Diffuse interstitial edema with small posterior pleural effusions. Electronically Signed   By: Ashley Royalty M.D.   On: 03/16/2018 02:45       MAJOR EVENTS/TEST RESULTS: As per HPI  INDWELLING DEVICES:: Left AV fistula  MICRO DATA: MRSA PCR -Negative  ANTIMICROBIALS:  nill  ASSESSMENT/PLAN   1. Acute hypoxic respiratory Failure 2. Acute diastolic dysfunction heart failure 3. Fluid Overload 4. End stage renal disease on hemodialysis 5. ? COPD exacerbation 6. Hx of OSA  Plan 1. Liberated from BIPAP post HD 2. S/P hemodialysis with 2.3 liters ultrafiltration 3. Restart home meds 4. GI and DVT prophylaxis 5. Duoneb, steroids- will rapidly wean    I have personally obtained a history, examined the patient, evaluated laboratory and independently reviewed  imaging results, formulated the assessment and plan and placed orders.  The Patient requires high complexity decision making for assessment and support, frequent evaluation and titration of therapies, application of advanced monitoring technologies and extensive interpretation of multiple databases. Critical Care Time devoted to patient care services described in this note is 35 minutes.     Cammie Sickle, M.D

## 2018-03-16 NOTE — Progress Notes (Signed)
Patient seen and examined. Doing well post dialysis. Now on room air. Vitals signs normal. Offers no complaints. OK to transfer patient out of the ICU   Blood pressure 140/72, pulse 92, temperature 99.3 F (37.4 C), temperature source Axillary, resp. rate 15, height 4\' 10"  (1.473 m), weight 131 lb 9.8 oz (59.7 kg), SpO2 93 %.  Phyllis [name] S. [facility] ANP-BC Pulmonary and Critical Care Medicine [facility] Pager [phone] or [phone]  NB: This document was prepared using Dragon voice recognition software and may include unintentional dictation errors.

## 2018-03-16 NOTE — ED Triage Notes (Signed)
Patient reports feeling short of breath since Friday.

## 2018-03-17 ENCOUNTER — Inpatient Hospital Stay: Payer: Medicare Other

## 2018-03-17 LAB — RENAL FUNCTION PANEL
Albumin: 3.6 g/dL (ref 3.5–5.0)
Anion gap: 11 (ref 5–15)
BUN: 31 mg/dL — ABNORMAL HIGH (ref 6–20)
CO2: 30 mmol/L (ref 22–32)
Calcium: 8.9 mg/dL (ref 8.9–10.3)
Chloride: 96 mmol/L — ABNORMAL LOW (ref 101–111)
Creatinine, Ser: 4.17 mg/dL — ABNORMAL HIGH (ref 0.44–1.00)
GFR calc Af Amer: 11 mL/min — ABNORMAL LOW (ref >60)
GFR calc non Af Amer: 10 mL/min — ABNORMAL LOW (ref >60)
Glucose, Bld: 182 mg/dL — ABNORMAL HIGH (ref 65–99)
Phosphorus: 4.4 mg/dL (ref 2.5–4.6)
Potassium: 4.1 mmol/L (ref 3.5–5.1)
Sodium: 137 mmol/L (ref 135–145)

## 2018-03-17 LAB — CBC
HCT: 30.6 % — ABNORMAL LOW (ref 35.0–47.0)
Hemoglobin: 10.4 g/dL — ABNORMAL LOW (ref 12.0–16.0)
MCH: 29.9 pg (ref 26.0–34.0)
MCHC: 33.9 g/dL (ref 32.0–36.0)
MCV: 88.4 fL (ref 80.0–100.0)
Platelets: 166 10*3/uL (ref 150–440)
RBC: 3.46 MIL/uL — ABNORMAL LOW (ref 3.80–5.20)
RDW: 15.8 % — ABNORMAL HIGH (ref 11.5–14.5)
WBC: 6.1 10*3/uL (ref 3.6–11.0)

## 2018-03-17 MED ORDER — FUROSEMIDE 80 MG PO TABS: 80.0000 mg | ORAL_TABLET | Freq: Every day | ORAL | 0 refills | Status: DC

## 2018-03-17 MED ORDER — FUROSEMIDE 40 MG PO TABS
80.0000 mg | ORAL_TABLET | Freq: Every day | ORAL | Status: DC
  Administered 2018-03-17: 80 mg via ORAL
  Filled 2018-03-17: qty 2

## 2018-03-17 MED ORDER — SODIUM CHLORIDE 0.9% FLUSH: 3.0000 mL | INTRAVENOUS | Status: DC | PRN

## 2018-03-17 MED ORDER — ALBUTEROL SULFATE (2.5 MG/3ML) 0.083% IN NEBU: 2.5000 mg | INHALATION_SOLUTION | RESPIRATORY_TRACT | Status: DC | PRN

## 2018-03-17 MED ORDER — IPRATROPIUM-ALBUTEROL 0.5-2.5 (3) MG/3ML IN SOLN: 3.0000 mL | Freq: Three times a day (TID) | RESPIRATORY_TRACT | Status: DC

## 2018-03-17 MED ORDER — SODIUM CHLORIDE 0.9% FLUSH
3.0000 mL | Freq: Two times a day (BID) | INTRAVENOUS | Status: DC
  Administered 2018-03-17: 3 mL via INTRAVENOUS

## 2018-03-17 MED ORDER — SODIUM CHLORIDE 0.9% FLUSH: 3.0000 mL | Freq: Two times a day (BID) | INTRAVENOUS | Status: DC

## 2018-03-17 NOTE — Discharge Summary (Signed)
[name] NAME: Phyllis [name]    MR#:  [id number]  DATE OF BIRTH:  Apr 22, 1946  DATE OF ADMISSION:  03/16/2018 ADMITTING PHYSICIAN: Loletha Grayer, MD  DATE OF DISCHARGE: 03/17/2018  1:32 PM  PRIMARY CARE PHYSICIAN: [name], MD    ADMISSION DIAGNOSIS:  Shortness of breath [R06.02] Acute pulmonary edema (HCC) [J81.0]  DISCHARGE DIAGNOSIS:  Acute hypoxic respiratory failure Acute diastolic congestive heart failure  SECONDARY DIAGNOSIS:   Past Medical History:  Diagnosis Date  . Allergy   . Anemia of chronic disease   . Anxiety   . Aortic atherosclerosis ([place])   . Asthma   . Chronic back pain   . COPD (chronic obstructive pulmonary disease) ([place])   . Diabetes mellitus with complication ([place])   . ESRD on hemodialysis ([place])    a. Tues/Sat; b. 2/2 small kidneys  . Essential hypertension   . Fistula    lower left arm  . GERD (gastroesophageal reflux disease)   . Gout   . History of echocardiogram    a. TTE 01/2014: nl LV sys fxn, no valvular abnormalities; b. TTE 11/16: nl EF, mild LVH  . History of exercise stress test    a. 01/2014: no evidence of ischemia; b. Lexiscan 08/2015: no sig ischemia, severe GI uptake artifact, low risk; c. CPET @ [facility] 09/2016: exercised 3 min 12 sec on bike without incline, 2.28 METs, VO2 of 8.1, 48% of predicted, indicating mod to sev functional impairment, evidence of blunted HR, stroke volume, and BP augmentation as well as ventilation-perfusion mismatch with exercise  . HLD (hyperlipidemia)   . 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 Renal insufficiency 09/24/2017   Dialysis patient.  . Sleep apnea     HOSPITAL COURSE:   1.  Acute hypoxic respiratory failure.  Patient was admitted in respiratory distress on BiPAP.  She was on 60% FiO2 on the BiPAP when I saw her.  She was admitted to the CCU stepdown.  Dialysis was done.  This morning when I saw her she  was breathing comfortably on room air.  This has resolved totally. 2.  Acute diastolic congestive heart failure.  The patient also has mild to moderate aortic stenosis.  The patient has an allergy to ACE inhibitor and this is also contraindicated with those patients with aortic stenosis.  Patient is on a beta-blocker.  Patient was given 120 mg of IV Lasix yesterday.  We will give 80 mg of Lasix daily.  Dialysis to manage fluid. 3.  History of COPD.  I do not think this was a COPD exacerbation because the patient improved so rapidly.  Stop steroid.  Go back to normal inhalers. 4.  Accelerated hypertension secondary to respiratory distress.  Continue usual medications.  Blood pressure improved. 5.  GERD on PPI 6.  Hyperlipidemia unspecified on pravastatin 7.  End-stage renal disease on dialysis only twice a week.  Spoke with Dr. Rolly Salter about potentially increasing to 3 times a week.  Patient is very hesitant about this.  Patient states that she only allows the them to take off 1600 of fluid every dialysis session.  If she is only going to go to dialysis twice a week they need to take off more fluid than this.  I recommended 3 times a week dialysis.  The patient states she will speak with Dr. Holley Raring about this.  Lasix prescribed daily. 8.  Neck pain and right  shoulder pain has resolved.  Steroids helped.  DISCHARGE CONDITIONS:   Satisfactory  CONSULTS OBTAINED:  Treatment Team:  Arta Silence, MD Lavonia Dana, MD  DRUG ALLERGIES:   Allergies  Allergen Reactions  . Codeine Nausea And Vomiting and Nausea Only  . Enalapril Maleate     Other reaction(s): Headache  . Nitrofurantoin Swelling and Rash    Other Reaction: swelling of body  . Sulfamethoxazole-Trimethoprim Swelling  . Vicodin [Hydrocodone-Acetaminophen] Nausea And Vomiting and Nausea Only  . 2,4-D Dimethylamine (Amisol) Rash    Other Reaction: h/a  . Baclofen Other (See Comments) and Nausea Only    lightheadness  ,drowsiness , muscle weakness , twitching in hands   . Neosporin [Neomycin-Bacitracin Zn-Polymyx] Other (See Comments) and Rash    Other Reaction: irritation Skin irritation   . Quinine Rash    Other Reaction: Vomiting rash, h/a, vision  . Ultram [Tramadol] Palpitations  . Zocor [Simvastatin] Other (See Comments) and Rash    Other Reaction: muscle spasms Muscle pain and spasms  . Bactrim [Sulfamethoxazole-Trimethoprim] Swelling  . Levodopa   . Macrodantin [Nitrofurantoin Macrocrystal] Swelling  . Quinine Derivatives Other (See Comments)    Vertigo,nausea vomiting blurred vision headache ears sensitivity   . Vasotec [Enalapril] Other (See Comments)    Headaches     DISCHARGE MEDICATIONS:   Allergies as of 03/17/2018      Reactions   Codeine Nausea And Vomiting, Nausea Only   Enalapril Maleate    Other reaction(s): Headache   Nitrofurantoin Swelling, Rash   Other Reaction: swelling of body   Sulfamethoxazole-trimethoprim Swelling   Vicodin [hydrocodone-acetaminophen] Nausea And Vomiting, Nausea Only   2,4-d Dimethylamine (amisol) Rash   Other Reaction: h/a   Baclofen Other (See Comments), Nausea Only   lightheadness ,drowsiness , muscle weakness , twitching in hands    Neosporin [neomycin-bacitracin Zn-polymyx] Other (See Comments), Rash   Other Reaction: irritation Skin irritation   Quinine Rash   Other Reaction: Vomiting rash, h/a, vision   Ultram [tramadol] Palpitations   Zocor [simvastatin] Other (See Comments), Rash   Other Reaction: muscle spasms Muscle pain and spasms   Bactrim [sulfamethoxazole-trimethoprim] Swelling   Levodopa    Macrodantin [nitrofurantoin Macrocrystal] Swelling   Quinine Derivatives Other (See Comments)   Vertigo,nausea vomiting blurred vision headache ears sensitivity   Vasotec [enalapril] Other (See Comments)   Headaches      Medication List    STOP taking these medications   VESICARE 10 MG tablet Generic drug:  solifenacin      TAKE these medications   albuterol 108 (90 Base) MCG/ACT inhaler Commonly known as:  PROVENTIL HFA;VENTOLIN HFA Inhale 2 puffs into the lungs every 6 (six) hours as needed for wheezing or shortness of breath. Notes to patient:  None given today   amLODipine 10 MG tablet Commonly known as:  NORVASC Take 10 mg by mouth.   aspirin 81 MG EC tablet Take 1 tablet (81 mg total) by mouth daily.   calcium acetate 667 MG capsule Commonly known as:  PHOSLO Take 667 mg by mouth 3 (three) times daily with meals.   carvedilol 12.5 MG tablet Commonly known as:  COREG Take 12.5 mg by mouth 2 (two) times daily with a meal.   cetirizine 10 MG tablet Commonly known as:  ZYRTEC Take 10 mg by mouth as needed for allergies. Notes to patient:  NONE GIVEN TODAY   clopidogrel 75 MG tablet Commonly known as:  PLAVIX Take 1 tablet (75 mg total) by mouth daily.  cyclobenzaprine 10 MG tablet Commonly known as:  FLEXERIL Take 10 mg by mouth 3 (three) times daily as needed for muscle spasms.   docusate sodium 100 MG capsule Commonly known as:  COLACE Take 100 mg by mouth 3 (three) times daily as needed for mild constipation.   Fluticasone-Salmeterol 250-50 MCG/DOSE Aepb Commonly known as:  ADVAIR Inhale 1 puff into the lungs 2 (two) times daily.   furosemide 80 MG tablet Commonly known as:  LASIX Take 1 tablet (80 mg total) by mouth daily.   HYDROcodone-acetaminophen 5-325 MG tablet Commonly known as:  NORCO Take 1 tablet by mouth every 6 (six) hours as needed for moderate pain or severe pain.   lidocaine-prilocaine cream Commonly known as:  EMLA Apply 1 application topically as needed.   nitroGLYCERIN 0.4 MG SL tablet Commonly known as:  NITROSTAT Place 1 tablet (0.4 mg total) under the tongue every 5 (five) minutes as needed for chest pain.   omeprazole 20 MG capsule Commonly known as:  PRILOSEC Take 20 mg by mouth 2 (two) times daily before a meal.   ondansetron 4 MG  tablet Commonly known as:  ZOFRAN Take 4 mg by mouth every 8 (eight) hours as needed for nausea or vomiting.   oxybutynin 15 MG 24 hr tablet Commonly known as:  DITROPAN XL Take 15 mg by mouth daily.   pravastatin 40 MG tablet Commonly known as:  PRAVACHOL Take 40 mg by mouth daily.   senna 8.6 MG tablet Commonly known as:  SENOKOT Take 1 tablet by mouth daily.   SYMBICORT 160-4.5 MCG/ACT inhaler Generic drug:  budesonide-formoterol Inhale into the lungs.   topiramate 25 MG tablet Commonly known as:  TOPAMAX Take 25 mg by mouth as needed.   Vitamin D (Ergocalciferol) 50000 units Caps capsule Commonly known as:  DRISDOL Take 50,000 Units by mouth every Wednesday.        DISCHARGE INSTRUCTIONS:   Follow-up with dialysis as scheduled and consider 3 times a week dialysis. Follow-up PMD 1 week  If you experience worsening of your admission symptoms, develop shortness of breath, life threatening emergency, suicidal or homicidal thoughts you must seek medical attention immediately by calling 911 or calling your MD immediately  if symptoms less severe.  You Must read complete instructions/literature along with all the possible adverse reactions/side effects for all the Medicines you take and that have been prescribed to you. Take any new Medicines after you have completely understood and accept all the possible adverse reactions/side effects.   Please note  You were cared for by a hospitalist during your hospital stay. If you have any questions about your discharge medications or the care you received while you were in the hospital after you are discharged, you can call the unit and asked to speak with the hospitalist on call if the hospitalist that took care of you is not available. Once you are discharged, your primary care physician will handle any further medical issues. Please note that NO REFILLS for any discharge medications will be authorized once you are discharged, as it  is imperative that you return to your primary care physician (or establish a relationship with a primary care physician if you do not have one) for your aftercare needs so that they can reassess your need for medications and monitor your lab values.    Today   CHIEF COMPLAINT:   Chief Complaint  Patient presents with  . Shortness of Breath    HISTORY OF PRESENT ILLNESS:  Phyllis [name]  is a 72 y.o. female with a known history of end-stage renal disease on dialysis and CHF.   VITAL SIGNS:  Blood pressure (!) 158/60, pulse 93, temperature 98 F (36.7 C), temperature source Oral, resp. rate (!) 22, height 4\' 10"  (1.473 m), weight 57.2 kg (126 lb), SpO2 100 %.   PHYSICAL EXAMINATION:  GENERAL:  72 y.o.-year-old patient lying in the bed with no acute distress.  Respiratory rate 16 when I saw her. EYES: Pupils equal, round, reactive to light and accommodation. No scleral icterus. Extraocular muscles intact.  HEENT: Head atraumatic, normocephalic. Oropharynx and nasopharynx clear.  NECK:  Supple, no jugular venous distention. No thyroid enlargement, no tenderness.  LUNGS: Normal breath sounds bilaterally, no wheezing, rales,rhonchi or crepitation. No use of accessory muscles of respiration.  CARDIOVASCULAR: S1, S2 normal. No murmurs, rubs, or gallops.  ABDOMEN: Soft, non-tender, non-distended. Bowel sounds present. No organomegaly or mass.  EXTREMITIES: Trace pedal edema, no cyanosis, or clubbing.  NEUROLOGIC: Cranial nerves II through XII are intact. Muscle strength 5/5 in all extremities. Sensation intact. Gait not checked.  PSYCHIATRIC: The patient is alert and oriented x 3.  SKIN: No obvious rash, lesion, or ulcer.   DATA REVIEW:   CBC Recent Labs  Lab 03/17/18 0353  WBC 6.1  HGB 10.4*  HCT 30.6*  PLT 166    Chemistries  Recent Labs  Lab 03/17/18 0353  NA 137  K 4.1  CL 96*  CO2 30  GLUCOSE 182*  BUN 31*  CREATININE 4.17*  CALCIUM 8.9    Cardiac  Enzymes Recent Labs  Lab 03/16/18 0208  TROPONINI 0.03*    Microbiology Results  Results for orders placed or performed during the hospital encounter of 03/16/18  MRSA PCR Screening     Status: None   Collection Time: 03/16/18 10:00 AM  Result Value Ref Range Status   MRSA by PCR NEGATIVE NEGATIVE Final    Comment:        The GeneXpert MRSA Assay (FDA approved for NASAL specimens only), is one component of a comprehensive MRSA colonization surveillance program. It is not intended to diagnose MRSA infection nor to guide or monitor treatment for MRSA infections. Performed at [facility], [address] [place], [place] [address]     RADIOLOGY:  Dg Chest 2 View  Result Date: 03/16/2018 CLINICAL DATA:  Dyspnea x1 day with chest pain EXAM: CHEST - 2 VIEW COMPARISON:  01/16/2018 CXR FINDINGS: Stable cardiomegaly with tortuous atherosclerotic aorta. Diffuse increase in interstitial lung markings consistent with interstitial edema. No alveolar consolidation, effusion or pneumothorax. Slight blunting of the posterior costophrenic angles likely related to small pleural effusions. IMPRESSION: 1. Stable cardiomegaly with tortuous atherosclerotic aorta. No aneurysm. 2. Diffuse interstitial edema with small posterior pleural effusions. Electronically Signed   By: Ashley Royalty M.D.   On: 03/16/2018 02:45   Dg Chest Port 1 View  Result Date: 03/17/2018 CLINICAL DATA:  CHF EXAM: PORTABLE CHEST 1 VIEW COMPARISON:  None. FINDINGS: Normal heart size. Lungs clear. No pneumothorax. No pleural effusion. IMPRESSION: No active disease. Electronically Signed   By: Marybelle Killings M.D.   On: 03/17/2018 09:32      Management plans discussed with the patient, family and they are in agreement.  CODE STATUS:     Code Status Orders  (From admission, onward)        Start     Ordered   03/16/18 0806  Full code  Continuous  03/16/18 0806    Code Status History    Date Active Date  Inactive Code Status Order ID Comments User Context   01/16/2018 1630 01/18/2018 2158 Full Code [id number]  Dustin Flock, MD Inpatient   01/16/2018 1340 01/16/2018 1630 DNR [id number]  Dustin Flock, MD ED   09/18/2017 1348 09/19/2017 1513 DNR [id number]  Loletha Grayer, MD ED   04/12/2015 1056 04/12/2015 1631 Full Code [id number]  Dew, Erskine Squibb, MD Inpatient      TOTAL TIME TAKING CARE OF THIS PATIENT: 35 minutes.    Loletha Grayer M.D on 03/17/2018 at 1:33 PM  Between 7am to 6pm - Pager - [phone]  After 6pm go to [email or web address] - password [email or web address]  [facility] Office  [phone]  CC: Primary care physician; [name], MD

## 2018-03-17 NOTE — Progress Notes (Signed)
[facility]  ROUNDING NOTE   Subjective:   Emergent hemodialysis yesterday. Tolerated treatment well. Uf of 2.3 liters.  Friend at bedside.   Objective:  Vital signs in last 24 hours:  Temp:  [98 F (36.7 C)-99.3 F (37.4 C)] 98 F (36.7 C) (04/28 0334) Pulse Rate:  [77-104] 93 (04/28 1027) Resp:  [14-33] 22 (04/28 1027) BP: (132-173)/(50-107) 158/60 (04/28 1027) SpO2:  [92 %-100 %] 100 % (04/28 1027) Weight:  [57.2 kg (126 lb)] 57.2 kg (126 lb) (04/27 2333)  Weight change: -1.082 kg (-2 lb 6.2 oz) Filed Weights   03/16/18 0946 03/16/18 1030 03/16/18 2333  Weight: 59.7 kg (131 lb 9.8 oz) 59.7 kg (131 lb 9.8 oz) 57.2 kg (126 lb)    Intake/Output: I/O last 3 completed shifts: In: 360 [P.O.:360] Out: 2500 [Urine:200; Other:2300]   Intake/Output this shift:  Total I/O In: 0  Out: 100 [Urine:100]  Physical Exam: General: Critically ill   Head: BIPAP  Eyes: Anicteric, PERRL  Neck: Supple, trachea midline  Lungs:  clear  Heart: Regular rate and rhythm  Abdomen:  Soft, nontender,   Extremities: No peripheral edema.  Neurologic: Nonfocal, moving all four extremities  Skin: No lesions  Access: Left AVF    Basic Metabolic Panel: Recent Labs  Lab 03/16/18 0208 03/17/18 0353  NA 137 137  K 4.8 4.1  CL 101 96*  CO2 25 30  GLUCOSE 120* 182*  BUN 46* 31*  CREATININE 6.73* 4.17*  CALCIUM 8.6* 8.9  PHOS  --  4.4    Liver Function Tests: Recent Labs  Lab 03/17/18 0353  ALBUMIN 3.6   No results for input(s): LIPASE, AMYLASE in the last 168 hours. No results for input(s): AMMONIA in the last 168 hours.  CBC: Recent Labs  Lab 03/16/18 0208 03/17/18 0353  WBC 7.2 6.1  HGB 10.4* 10.4*  HCT 30.5* 30.6*  MCV 88.6 88.4  PLT 187 166    Cardiac Enzymes: Recent Labs  Lab 03/16/18 0208  TROPONINI 0.03*    BNP: Invalid input(s): POCBNP  CBG: Recent Labs  Lab 03/16/18 0944  GLUCAP 112*    Microbiology: Results for orders placed or  performed during the hospital encounter of 03/16/18  MRSA PCR Screening     Status: None   Collection Time: 03/16/18 10:00 AM  Result Value Ref Range Status   MRSA by PCR NEGATIVE NEGATIVE Final    Comment:        The GeneXpert MRSA Assay (FDA approved for NASAL specimens only), is one component of a comprehensive MRSA colonization surveillance program. It is not intended to diagnose MRSA infection nor to guide or monitor treatment for MRSA infections. Performed at [facility], [address] [place], [place] [address]     Coagulation Studies: No results for input(s): LABPROT, INR in the last 72 hours.  Urinalysis: Recent Labs    03/16/18 0348  COLORURINE STRAW*  LABSPEC 1.011  PHURINE 7.0  GLUCOSEU 50*  HGBUR NEGATIVE  BILIRUBINUR NEGATIVE  KETONESUR NEGATIVE  PROTEINUR 100*  NITRITE NEGATIVE  LEUKOCYTESUR SMALL*      Imaging: Dg Chest 2 View  Result Date: 03/16/2018 CLINICAL DATA:  Dyspnea x1 day with chest pain EXAM: CHEST - 2 VIEW COMPARISON:  01/16/2018 CXR FINDINGS: Stable cardiomegaly with tortuous atherosclerotic aorta. Diffuse increase in interstitial lung markings consistent with interstitial edema. No alveolar consolidation, effusion or pneumothorax. Slight blunting of the posterior costophrenic angles likely related to small pleural effusions. IMPRESSION: 1. Stable cardiomegaly  with tortuous atherosclerotic aorta. No aneurysm. 2. Diffuse interstitial edema with small posterior pleural effusions. Electronically Signed   By: Ashley Royalty M.D.   On: 03/16/2018 02:45   Dg Chest Port 1 View  Result Date: 03/17/2018 CLINICAL DATA:  CHF EXAM: PORTABLE CHEST 1 VIEW COMPARISON:  None. FINDINGS: Normal heart size. Lungs clear. No pneumothorax. No pleural effusion. IMPRESSION: No active disease. Electronically Signed   By: Marybelle Killings M.D.   On: 03/17/2018 09:32     Medications:    . amLODipine  10 mg Oral Daily  . aspirin EC  81 mg Oral Daily  .  budesonide (PULMICORT) nebulizer solution  0.5 mg Nebulization BID  . calcium acetate  667 mg Oral TID WC  . carvedilol  12.5 mg Oral BID WC  . clopidogrel  75 mg Oral Daily  . furosemide  80 mg Oral Daily  . heparin  5,000 Units Subcutaneous Q8H  . ipratropium-albuterol  3 mL Nebulization TID  . loratadine  10 mg Oral Daily  . oxybutynin  15 mg Oral Daily  . pantoprazole  40 mg Oral Daily  . pravastatin  40 mg Oral Daily  . senna  1 tablet Oral Daily  . sodium chloride flush  3 mL Intravenous Q12H  . sodium chloride flush  3 mL Intravenous Q12H   acetaminophen **OR** acetaminophen, albuterol, cyclobenzaprine, docusate sodium, hydrALAZINE, HYDROcodone-acetaminophen, lidocaine-prilocaine, nitroGLYCERIN, ondansetron, sodium chloride flush  Assessment/ Plan:  Ms. AIDALY CORDNER is a 72 y.o.  female Ms. ANGY SWEARENGIN is a 72 y.o. white female with end stage renal disease on hemodialysis, hypertension, hyperlipidemia, gout, GERD, diabetes mellitus type II, COPD  TS (twice a week) [place]. Left AVF 58.5kg 133min  1. End Stage Renal Disease: emergent hemodialysis on admission for fluid overload and pulmonary edema leading to acute respiratory failure requiring noninvasive ventilation.  - Discussed intradialytic fluid management - Discussed doing dialysis three times a week instead of two  2. Hypertension:   - amlodipine, carvedilol   3. Anemia of chronic kidney disease:   - EPO as outpatient.   4. Secondary Hyperparathyroidism: outpatient PTH elevated at 642.  - Calcium acetate with meals.   5. Acute respiratory failure: breathing room air this morning.  required noninvasive mechanical ventilation on admission Secondary to pulmonary edema, valvular heart disease and acute exacerbation of COPD.  - Emergent hemodialysis on admission.  - Steroids, nebs.    LOS: 1 Jacqualin Shirkey 4/28/201911:34 AM

## 2018-03-17 NOTE — Progress Notes (Signed)
Discharge to home with her friend.  Patient will drive herself.

## 2018-04-24 ENCOUNTER — Encounter (INDEPENDENT_AMBULATORY_CARE_PROVIDER_SITE_OTHER): Payer: Self-pay

## 2018-04-24 ENCOUNTER — Other Ambulatory Visit (INDEPENDENT_AMBULATORY_CARE_PROVIDER_SITE_OTHER): Payer: Self-pay | Admitting: Vascular Surgery

## 2018-04-28 MED ORDER — CEFAZOLIN SODIUM-DEXTROSE 1-4 GM/50ML-% IV SOLN: 1.0000 g | Freq: Once | INTRAVENOUS | Status: DC

## 2018-04-29 ENCOUNTER — Telehealth (INDEPENDENT_AMBULATORY_CARE_PROVIDER_SITE_OTHER): Payer: Self-pay | Admitting: Vascular Surgery

## 2018-04-29 ENCOUNTER — Encounter: Admission: RE | Payer: Self-pay | Source: Ambulatory Visit

## 2018-04-29 ENCOUNTER — Ambulatory Visit: Admission: RE | Admit: 2018-04-29 | Payer: Medicare Other | Source: Ambulatory Visit | Admitting: Vascular Surgery

## 2018-04-29 SURGERY — A/V FISTULAGRAM
Anesthesia: Moderate Sedation | Laterality: Left

## 2018-04-29 MED ORDER — SODIUM CHLORIDE 0.9 % IV SOLN: INTRAVENOUS | Status: DC

## 2018-04-29 MED ORDER — FAMOTIDINE 20 MG PO TABS: 40.0000 mg | ORAL_TABLET | ORAL | Status: DC | PRN

## 2018-04-29 MED ORDER — HYDROMORPHONE HCL 1 MG/ML IJ SOLN: 1.0000 mg | Freq: Once | INTRAMUSCULAR | Status: DC | PRN

## 2018-04-29 MED ORDER — METHYLPREDNISOLONE SODIUM SUCC 125 MG IJ SOLR: 125.0000 mg | INTRAMUSCULAR | Status: DC | PRN

## 2018-04-29 MED ORDER — ONDANSETRON HCL 4 MG/2ML IJ SOLN: 4.0000 mg | Freq: Four times a day (QID) | INTRAMUSCULAR | Status: DC | PRN

## 2018-04-29 NOTE — Telephone Encounter (Signed)
Please get her scheduled to be seen regarding her legs.

## 2018-04-29 NOTE — Telephone Encounter (Signed)
The appt she has in Sept is an HDA she is calling and need an appt to be seen for her legs.

## 2018-04-29 NOTE — Telephone Encounter (Signed)
YES

## 2018-06-04 ENCOUNTER — Encounter (INDEPENDENT_AMBULATORY_CARE_PROVIDER_SITE_OTHER): Payer: Self-pay | Admitting: Vascular Surgery

## 2018-06-04 ENCOUNTER — Ambulatory Visit (INDEPENDENT_AMBULATORY_CARE_PROVIDER_SITE_OTHER): Payer: Medicare Other | Admitting: Vascular Surgery

## 2018-06-04 VITALS — BP 154/68 | HR 76 | Resp 16 | Ht <= 58 in | Wt 131.4 lb

## 2018-06-04 DIAGNOSIS — N186 End stage renal disease: Secondary | ICD-10-CM | POA: Diagnosis not present

## 2018-06-04 DIAGNOSIS — E782 Mixed hyperlipidemia: Secondary | ICD-10-CM | POA: Diagnosis not present

## 2018-06-04 DIAGNOSIS — E1169 Type 2 diabetes mellitus with other specified complication: Secondary | ICD-10-CM | POA: Diagnosis not present

## 2018-06-04 NOTE — Progress Notes (Signed)
MRN : [id number]  Phyllis [name] is a 72 y.o. (Aug 24, 1946) female who presents with chief complaint of  Chief Complaint  Patient presents with  . Follow-up    pt have left arm swelling  .  History of Present Illness: Patient returns today in follow up of her dialysis access.  A few weeks ago, she was having significant pain overlying her left radiocephalic AV fistula both with and after dialysis.  This is essentially resolved.  Her fistula is working well without any major issues.  Current Outpatient Medications  Medication Sig Dispense Refill  . albuterol (PROVENTIL HFA;VENTOLIN HFA) 108 (90 Base) MCG/ACT inhaler Inhale 2 puffs into the lungs every 6 (six) hours as needed for wheezing or shortness of breath.    Marland Kitchen amLODipine (NORVASC) 10 MG tablet Take 10 mg by mouth.     Marland Kitchen aspirin EC 81 MG EC tablet Take 1 tablet (81 mg total) by mouth daily.    . budesonide-formoterol (SYMBICORT) 160-4.5 MCG/ACT inhaler Inhale into the lungs.    . calcium acetate (PHOSLO) 667 MG capsule Take 667 mg by mouth 3 (three) times daily with meals.     . carvedilol (COREG) 12.5 MG tablet Take 12.5 mg by mouth 2 (two) times daily with a meal.     . cetirizine (ZYRTEC) 10 MG tablet Take 10 mg by mouth as needed for allergies.    Marland Kitchen clopidogrel (PLAVIX) 75 MG tablet Take 1 tablet (75 mg total) by mouth daily. 30 tablet 0  . cyclobenzaprine (FLEXERIL) 10 MG tablet Take 10 mg by mouth 3 (three) times daily as needed for muscle spasms.    Marland Kitchen docusate sodium (COLACE) 100 MG capsule Take 100 mg by mouth 3 (three) times daily as needed for mild constipation.    . Fluticasone-Salmeterol (ADVAIR) 250-50 MCG/DOSE AEPB Inhale 1 puff into the lungs 2 (two) times daily.    . furosemide (LASIX) 80 MG tablet Take 1 tablet (80 mg total) by mouth daily. 30 tablet 0  . HYDROcodone-acetaminophen (NORCO) 5-325 MG tablet Take 1 tablet by mouth every 6 (six) hours as needed for moderate pain or severe pain. 10 tablet 0  .  lidocaine-prilocaine (EMLA) cream Apply 1 application topically as needed.    . nitroGLYCERIN (NITROSTAT) 0.4 MG SL tablet Place 1 tablet (0.4 mg total) under the tongue every 5 (five) minutes as needed for chest pain. 25 tablet 1  . omeprazole (PRILOSEC) 20 MG capsule Take 20 mg by mouth 2 (two) times daily before a meal.    . ondansetron (ZOFRAN) 4 MG tablet Take 4 mg by mouth every 8 (eight) hours as needed for nausea or vomiting.    Marland Kitchen oxybutynin (DITROPAN XL) 15 MG 24 hr tablet Take 15 mg by mouth daily.    . pravastatin (PRAVACHOL) 40 MG tablet Take 40 mg by mouth daily.     Marland Kitchen senna (SENOKOT) 8.6 MG tablet Take 1 tablet by mouth daily.    Marland Kitchen topiramate (TOPAMAX) 25 MG tablet Take 25 mg by mouth as needed.     . Vitamin D, Ergocalciferol, (DRISDOL) 50000 UNITS CAPS capsule Take 50,000 Units by mouth every Wednesday.      No current facility-administered medications for this visit.     Past Medical History:  Diagnosis Date  . Allergy   . Anemia of chronic disease   . Anxiety   . Aortic atherosclerosis ([place])   . Asthma   . Chronic back pain   . COPD (chronic  obstructive pulmonary disease) ([place])   . Diabetes mellitus with complication ([place])   . ESRD on hemodialysis ([place])    a. Tues/Sat; b. 2/2 small kidneys  . Essential hypertension   . Fistula    lower left arm  . GERD (gastroesophageal reflux disease)   . Gout   . History of echocardiogram    a. TTE 01/2014: nl LV sys fxn, no valvular abnormalities; b. TTE 11/16: nl EF, mild LVH  . History of exercise stress test    a. 01/2014: no evidence of ischemia; b. Lexiscan 08/2015: no sig ischemia, severe GI uptake artifact, low risk; c. CPET @ [facility] 09/2016: exercised 3 min 12 sec on bike without incline, 2.28 METs, VO2 of 8.1, 48% of predicted, indicating mod to sev functional impairment, evidence of blunted HR, stroke volume, and BP augmentation as well as ventilation-perfusion mismatch with exercise  . HLD (hyperlipidemia)   . 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 Renal insufficiency 09/24/2017   Dialysis patient.  . Sleep apnea     Past Surgical History:  Procedure Laterality Date  . carpel tunnel    . GALLBLADDER SURGERY    . PERIPHERAL VASCULAR CATHETERIZATION N/A 04/12/2015   Procedure: A/V Shuntogram/Fistulagram;  Surgeon: Algernon Huxley, MD;  Location: [facility];  Service: Cardiovascular;  Laterality: N/A;  . PERIPHERAL VASCULAR CATHETERIZATION N/A 04/12/2015   Procedure: A/V Shunt Intervention;  Surgeon: Algernon Huxley, MD;  Location: [facility];  Service: Cardiovascular;  Laterality: N/A;  . PERIPHERAL VASCULAR CATHETERIZATION N/A 06/09/2015   Procedure: Dialysis/Perma Catheter Removal;  Surgeon: Katha Cabal, MD;  Location: [place];  Service: Cardiovascular;  Laterality: N/A;    Social History  Substance Use Topics  . Smoking status: Never Smoker  . Smokeless tobacco: Never Used  . Alcohol use No    Family History       Family History  Problem Relation Age of Onset  . Hypertension Mother   . Hyperlipidemia Mother   . Heart disease Father   . Heart attack Father 82  . Hypertension Father   . Hyperlipidemia Father   . Heart disease Brother     CABG   . Heart attack Brother   . Breast cancer Neg Hx          Allergies  Allergen Reactions  . Codeine Nausea And Vomiting and Nausea Only  . Nitrofurantoin Swelling and Rash    Other Reaction: swelling of body  . Sulfamethoxazole-Trimethoprim Swelling  . Vicodin [Hydrocodone-Acetaminophen] Nausea And Vomiting and Nausea Only  . 2,4-D Dimethylamine (Amisol) Rash    Other Reaction: h/a  . Baclofen Other (See Comments) and Nausea Only    lightheadness ,drowsiness , muscle weakness , twitching in hands   . Neosporin [Neomycin-Bacitracin Zn-Polymyx] Other (See Comments) and Rash    Other Reaction: irritation Skin irritation   . Quinine Rash    Other Reaction:  Vomiting rash, h/a, vision  . Ultram [Tramadol] Palpitations  . Zocor [Simvastatin] Other (See Comments) and Rash    Other Reaction: muscle spasms Muscle pain and spasms  . Bactrim [Sulfamethoxazole-Trimethoprim] Swelling  . Macrodantin [Nitrofurantoin Macrocrystal] Swelling  . Quinine Derivatives Other (See Comments)    Vertigo,nausea vomiting blurred vision headache ears sensitivity   . Vasotec [Enalapril] Other (See Comments)    Headaches      REVIEW OF SYSTEMS(Negative unless checked)  Constitutional: [] Weight loss[] Fever[] Chills Cardiac:[] Chest pain[] Chest pressure[] Palpitations [] Shortness of breath when  laying flat [] Shortness of breath at rest [] Shortness of breath with exertion. Vascular: [] Pain in legs with walking[] Pain in legsat rest[] Pain in legs when laying flat [] Claudication [] Pain in feet when walking [] Pain in feet at rest [] Pain in feet when laying flat [] History of DVT [] Phlebitis [x] Swelling in legs [] Varicose veins [] Non-healing ulcers Pulmonary: [] Uses home oxygen [] Productive cough[] Hemoptysis [] Wheeze [] COPD [] Asthma Neurologic: [] Dizziness [] Blackouts [] Seizures [] History of stroke [] History of TIA[] Aphasia [] Temporary blindness[] Dysphagia [] Weaknessor numbness in arms [] Weakness or numbnessin legs Musculoskeletal: [] Arthritis [] Joint swelling [] Joint pain [] Low back pain Hematologic:[] Easy bruising[] Easy bleeding [] Hypercoagulable state [] Anemic  Gastrointestinal:[] Blood in stool[] Vomiting blood[] Gastroesophageal reflux/heartburn[] Abdominal pain Genitourinary: [x] Chronic kidney disease [] Difficulturination [] Frequenturination [] Burning with urination[] Hematuria Skin: [] Rashes [] Ulcers [] Wounds Psychological: [] History of anxiety[] History of major depression.      Physical Examination  BP (!) 154/68 (BP Location: Right Arm)    Pulse 76   Resp 16   Ht 4' 10"  (1.473 m)   Wt 131 lb 6.4 oz (59.6 kg)   BMI 27.46 kg/m  Gen:  WD/WN, NAD Head: [place]/AT, No temporalis wasting. Ear/Nose/Throat: Hearing grossly intact, nares w/o erythema or drainage Eyes: Conjunctiva clear. Sclera non-icteric Neck: Supple.  Trachea midline Pulmonary:  Good air movement, no use of accessory muscles.  Cardiac: irregular Vascular: good thrill in left forearm AVF Vessel Right Left  Radial Palpable Palpable                                   Musculoskeletal: M/S 5/5 throughout.  No deformity or atrophy. No edema. Neurologic: Sensation grossly intact in extremities.  Symmetrical.  Speech is fluent.  Psychiatric: Judgment intact, Mood & affect appropriate for pt's clinical situation. Dermatologic: No rashes or ulcers noted.  No cellulitis or open wounds.       Labs Recent Results (from the past 2160 hour(s))  Basic metabolic panel     Status: Abnormal   Collection Time: 03/16/18  2:08 AM  Result Value Ref Range   Sodium 137 135 - 145 mmol/L   Potassium 4.8 3.5 - 5.1 mmol/L   Chloride 101 101 - 111 mmol/L   CO2 25 22 - 32 mmol/L   Glucose, Bld 120 (H) 65 - 99 mg/dL   BUN 46 (H) 6 - 20 mg/dL   Creatinine, Ser 6.73 (H) 0.44 - 1.00 mg/dL   Calcium 8.6 (L) 8.9 - 10.3 mg/dL   GFR calc non Af Amer 6 (L) >60 mL/min   GFR calc Af Amer 6 (L) >60 mL/min    Comment: (NOTE) The eGFR has been calculated using the CKD EPI equation. This calculation has not been validated in all clinical situations. eGFR's persistently <60 mL/min signify possible Chronic Kidney Disease.    Anion gap 11 5 - 15    Comment: Performed at [facility], [address] [place], [place] [address]  CBC     Status: Abnormal   Collection Time: 03/16/18  2:08 AM  Result Value Ref Range   WBC 7.2 3.6 - 11.0 K/uL   RBC 3.44 (L) 3.80 - 5.20 MIL/uL   Hemoglobin 10.4 (L) 12.0 - 16.0 g/dL   HCT 30.5 (L) 35.0 - 47.0 %   MCV 88.6 80.0 - 100.0 fL    MCH 30.2 26.0 - 34.0 pg   MCHC 34.1 32.0 - 36.0 g/dL   RDW 15.7 (H) 11.5 - 14.5 %   Platelets 187 150 - 440 K/uL    Comment: Performed at [facility]  [facility], [place], [place] [address]  Troponin I     Status: Abnormal   Collection Time: 03/16/18  2:08 AM  Result Value Ref Range   Troponin I 0.03 (HH) <0.03 ng/mL    Comment: CRITICAL RESULT CALLED TO, READ BACK BY AND VERIFIED WITH MICHELE MORTON AT 0242 ON 03/16/18 RWW Performed at [name], [address] [place], [place] [address]   Urinalysis, Complete w Microscopic     Status: Abnormal   Collection Time: 03/16/18  3:48 AM  Result Value Ref Range   Color, Urine STRAW (A) YELLOW   APPearance CLEAR (A) CLEAR   Specific Gravity, Urine 1.011 1.005 - 1.030   pH 7.0 5.0 - 8.0   Glucose, UA 50 (A) NEGATIVE mg/dL   Hgb urine dipstick NEGATIVE NEGATIVE   Bilirubin Urine NEGATIVE NEGATIVE   Ketones, ur NEGATIVE NEGATIVE mg/dL   Protein, ur 100 (A) NEGATIVE mg/dL   Nitrite NEGATIVE NEGATIVE   Leukocytes, UA SMALL (A) NEGATIVE   RBC / HPF 0-5 0 - 5 RBC/hpf   WBC, UA >50 (H) 0 - 5 WBC/hpf   Bacteria, UA RARE (A) NONE SEEN   Squamous Epithelial / LPF 0-5 0 - 5    Comment: Please note change in reference range. Performed at [facility], [address] [place], [place] [address]   Glucose, capillary     Status: Abnormal   Collection Time: 03/16/18  9:44 AM  Result Value Ref Range   Glucose-Capillary 112 (H) 65 - 99 mg/dL  MRSA PCR Screening     Status: None   Collection Time: 03/16/18 10:00 AM  Result Value Ref Range   MRSA by PCR NEGATIVE NEGATIVE    Comment:        The GeneXpert MRSA Assay (FDA approved for NASAL specimens only), is one component of a comprehensive MRSA colonization surveillance program. It is not intended to diagnose MRSA infection nor to guide or monitor treatment for MRSA infections. Performed at [facility], [address] [place], [place]  [address]   CBC     Status: Abnormal   Collection Time: 03/17/18  3:53 AM  Result Value Ref Range   WBC 6.1 3.6 - 11.0 K/uL   RBC 3.46 (L) 3.80 - 5.20 MIL/uL   Hemoglobin 10.4 (L) 12.0 - 16.0 g/dL   HCT 30.6 (L) 35.0 - 47.0 %   MCV 88.4 80.0 - 100.0 fL   MCH 29.9 26.0 - 34.0 pg   MCHC 33.9 32.0 - 36.0 g/dL   RDW 15.8 (H) 11.5 - 14.5 %   Platelets 166 150 - 440 K/uL    Comment: Performed at [facility], [address] [place], [place] [address]  Renal function panel     Status: Abnormal   Collection Time: 03/17/18  3:53 AM  Result Value Ref Range   Sodium 137 135 - 145 mmol/L   Potassium 4.1 3.5 - 5.1 mmol/L   Chloride 96 (L) 101 - 111 mmol/L   CO2 30 22 - 32 mmol/L   Glucose, Bld 182 (H) 65 - 99 mg/dL   BUN 31 (H) 6 - 20 mg/dL   Creatinine, Ser 4.17 (H) 0.44 - 1.00 mg/dL   Calcium 8.9 8.9 - 10.3 mg/dL   Phosphorus 4.4 2.5 - 4.6 mg/dL   Albumin 3.6 3.5 - 5.0 g/dL   GFR calc non Af Amer 10 (L) >60 mL/min   GFR calc Af Amer 11 (L) >60 mL/min  Comment: (NOTE) The eGFR has been calculated using the CKD EPI equation. This calculation has not been validated in all clinical situations. eGFR's persistently <60 mL/min signify possible Chronic Kidney Disease.    Anion gap 11 5 - 15    Comment: Performed at [facility], [address], [place], [place] [address]    Radiology No results found.  Assessment/Plan Hyperlipidemia lipid control important in reducing the progression of atherosclerotic disease. Continue statin therapy   Type 2 diabetes mellitus with other specified complication blood glucose control important in reducing the progression of atherosclerotic disease. Also, involved in wound healing. On appropriate medications.   End stage renal disease The patient has a long-standing fistula which has been working reasonably well but was painful recently.  We will check an ultrasound later this year which may already be on the schedule.  If not, we  will place her on the schedule for a duplex in several months.  Continue to use the fistula and rotate her access sites as current.    Leotis Pain, MD  06/04/2018 10:27 AM    This note was created with Dragon medical transcription system.  Any errors from dictation are purely unintentional

## 2018-06-04 NOTE — Assessment & Plan Note (Signed)
The patient has a long-standing fistula which has been working reasonably well but was painful recently.  We will check an ultrasound later this year which may already be on the schedule.  If not, we will place her on the schedule for a duplex in several months.  Continue to use the fistula and rotate her access sites as current.

## 2018-08-16 ENCOUNTER — Ambulatory Visit (INDEPENDENT_AMBULATORY_CARE_PROVIDER_SITE_OTHER): Payer: Medicare Other | Admitting: Vascular Surgery

## 2018-08-16 ENCOUNTER — Encounter (INDEPENDENT_AMBULATORY_CARE_PROVIDER_SITE_OTHER): Payer: Medicare Other

## 2018-09-02 IMAGING — CR DG CHEST 2V
2 series · 2 of 2 positions shown · non-contrast
Comparison: 09/11/2017

CLINICAL DATA: Possible allergic reaction. Patient on Levaquin for
pneumonia.

EXAM:
CHEST  2 VIEW

[chest lat]
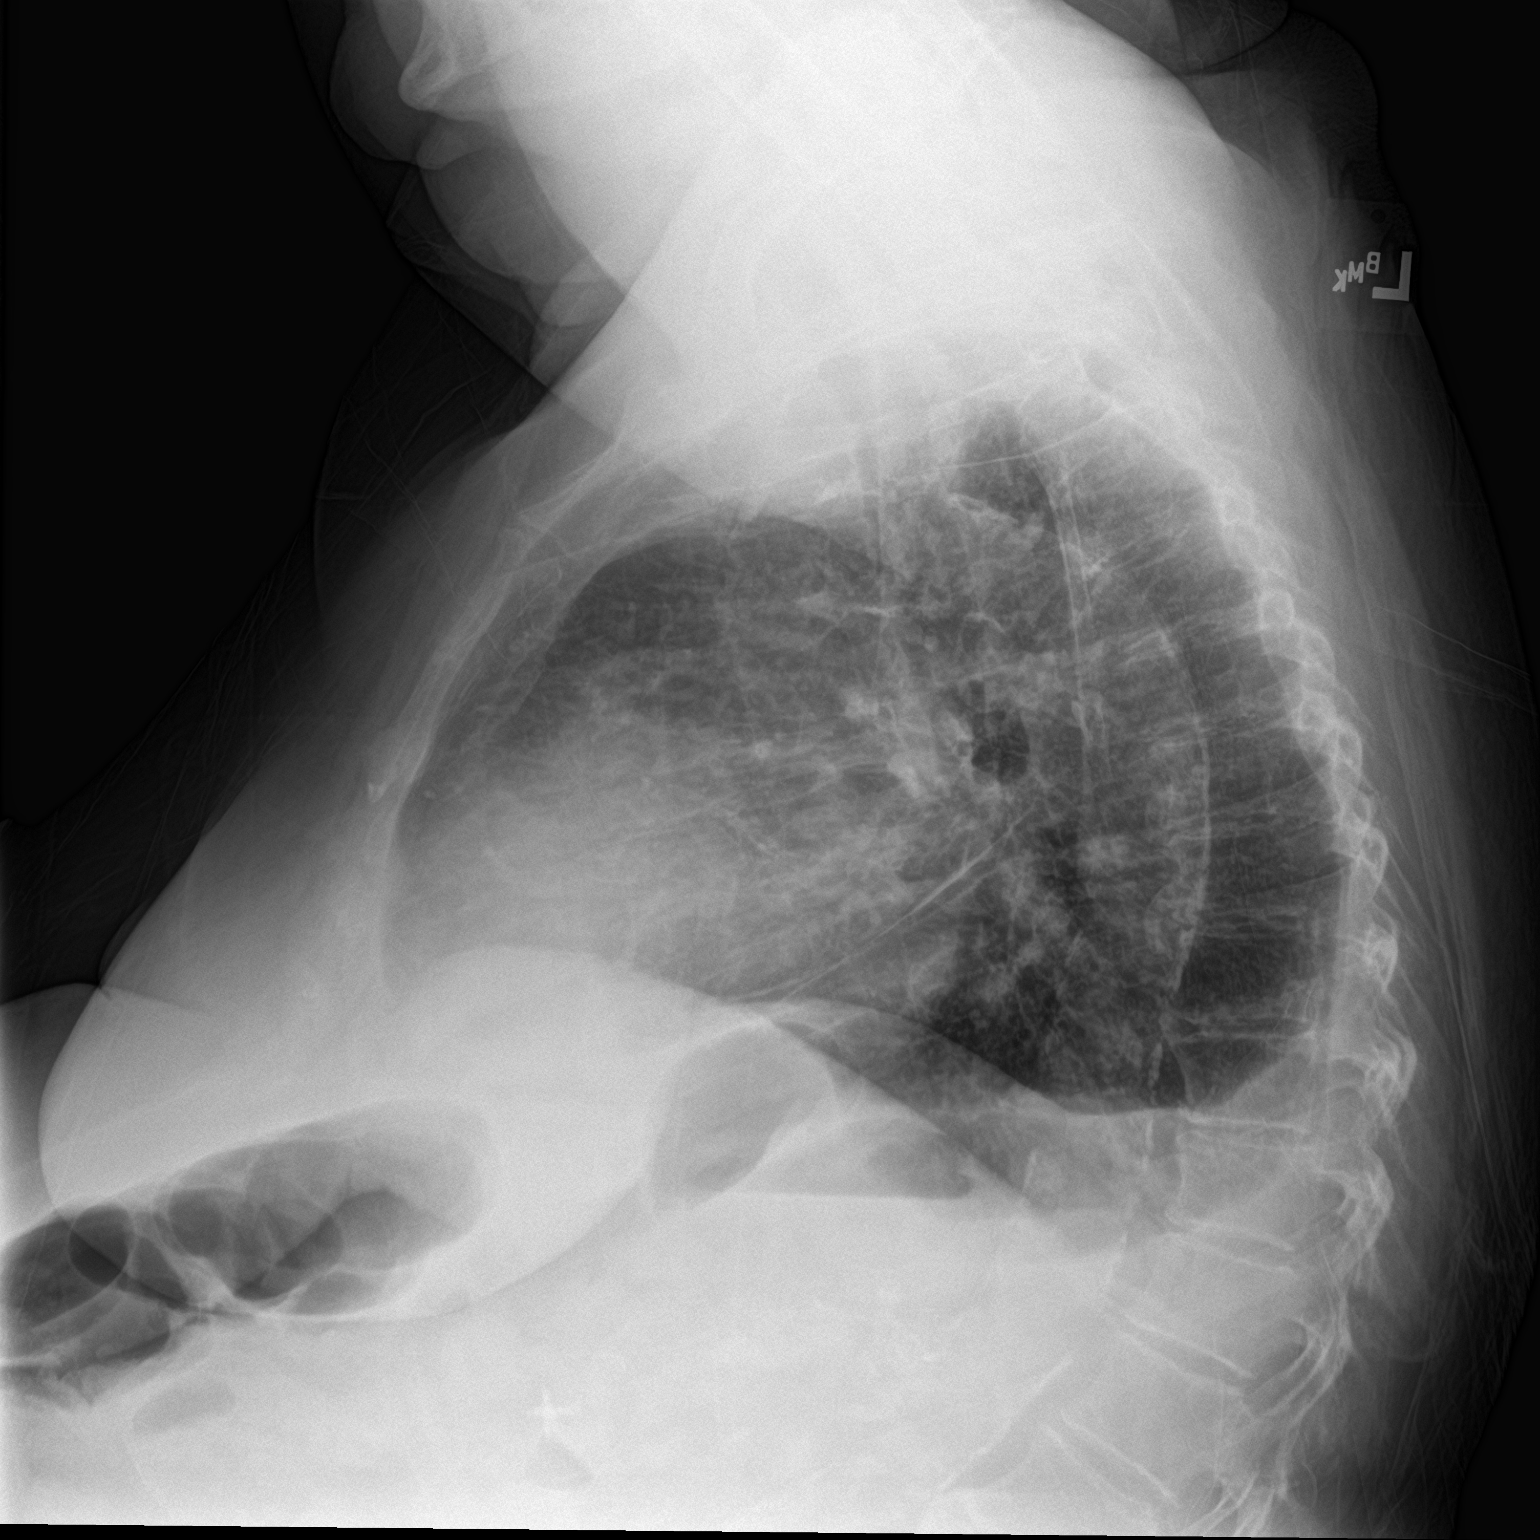

[chest ap]
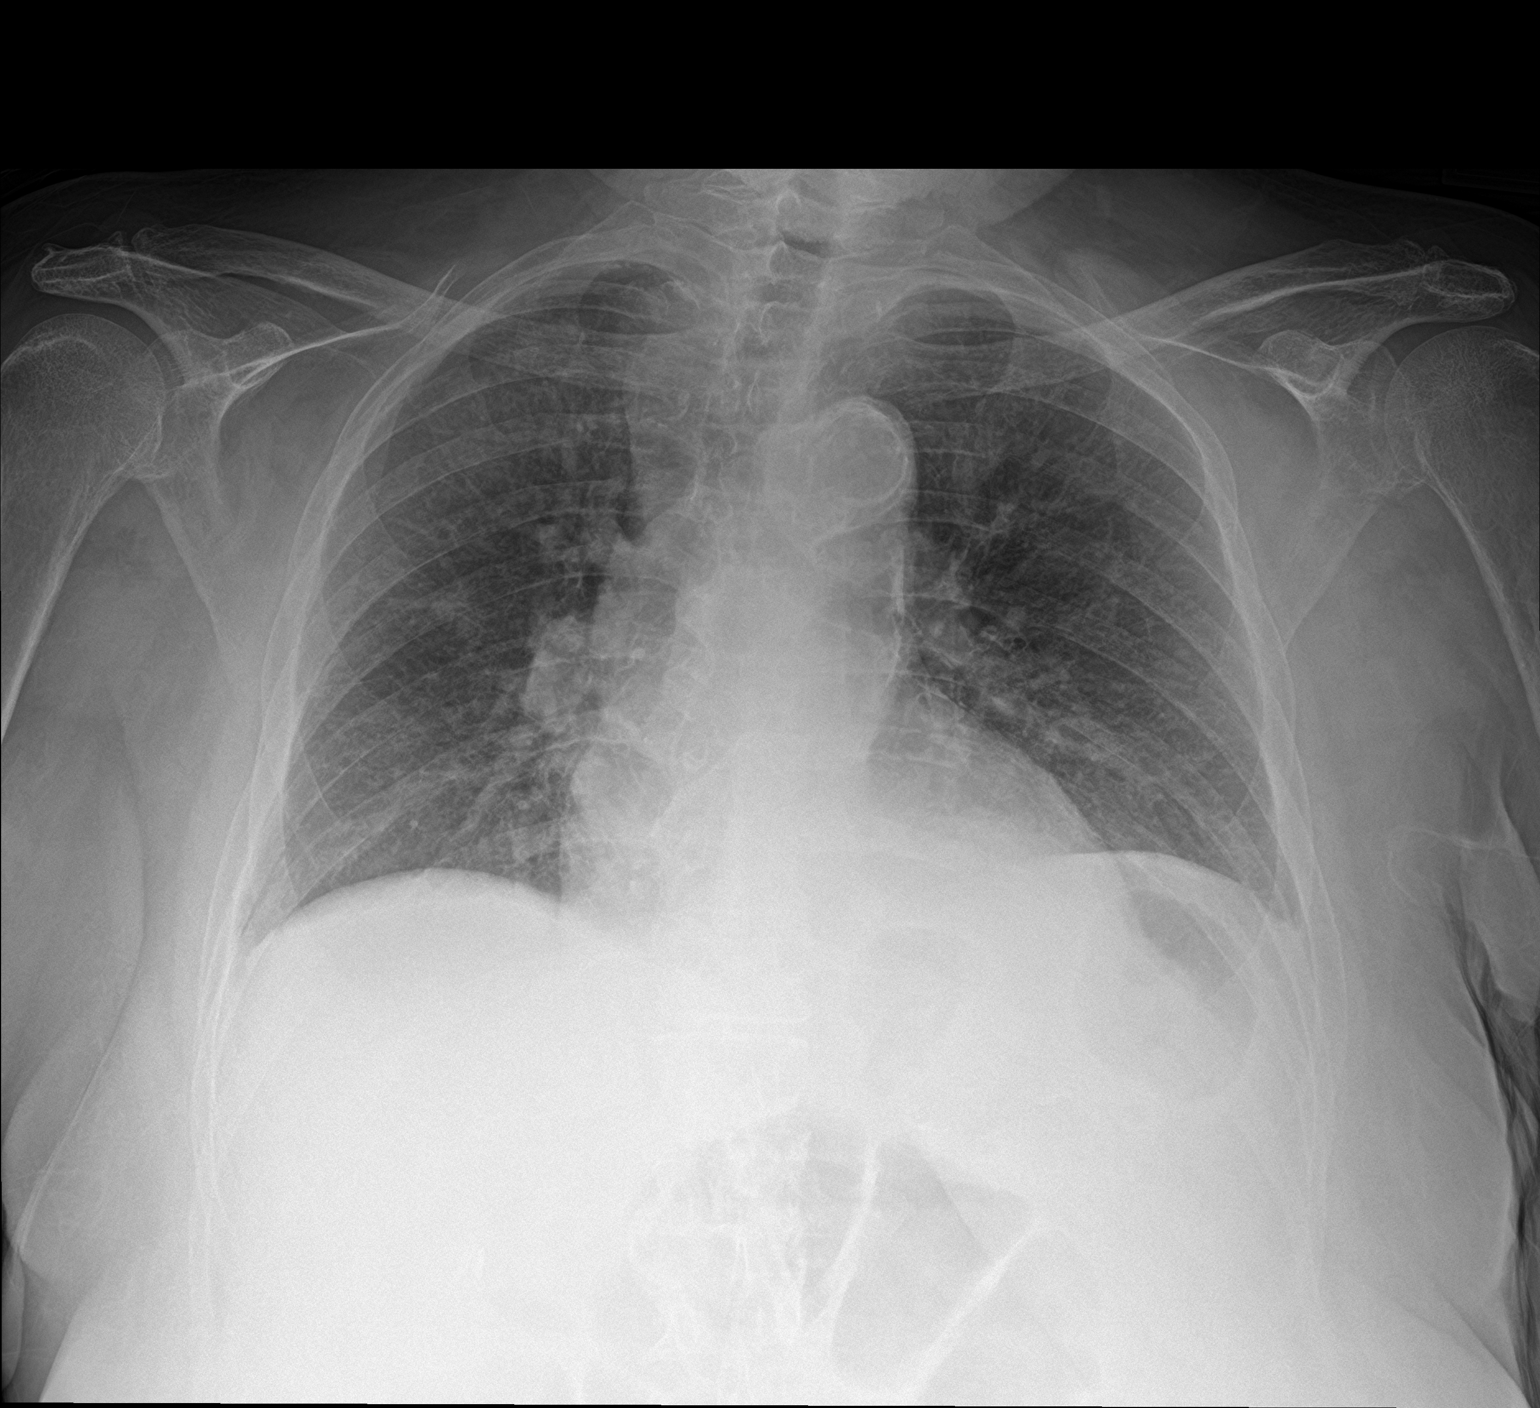

[2 of 2 positions shown; findings below may reference images not displayed]

FINDINGS: Lungs are hypoinflated and demonstrate subtle stable prominence of
the bronchovascular markings. Small bilateral pleural effusions
posteriorly on the lateral film slightly worse. Mild opacification
over the left retrocardiac region likely due to the effusions.
Cardiomediastinal silhouette and remainder of the exam is unchanged.
IMPRESSION: Subtle stable prominence of the bronchovascular markings which could
be due to mild vascular congestion versus atypical
infectious/inflammatory process. Small bilateral pleural effusions.

## 2018-09-05 ENCOUNTER — Ambulatory Visit (INDEPENDENT_AMBULATORY_CARE_PROVIDER_SITE_OTHER): Payer: Medicare Other | Admitting: Vascular Surgery

## 2018-09-05 ENCOUNTER — Encounter (INDEPENDENT_AMBULATORY_CARE_PROVIDER_SITE_OTHER): Payer: Medicare Other

## 2018-09-06 IMAGING — CR DG CHEST 2V
2 series · 2 of 2 positions shown · non-contrast
Comparison: 09/14/2017

CLINICAL DATA: Chest pain on and off since yesterday.

EXAM:
CHEST  2 VIEW

[chest lat]
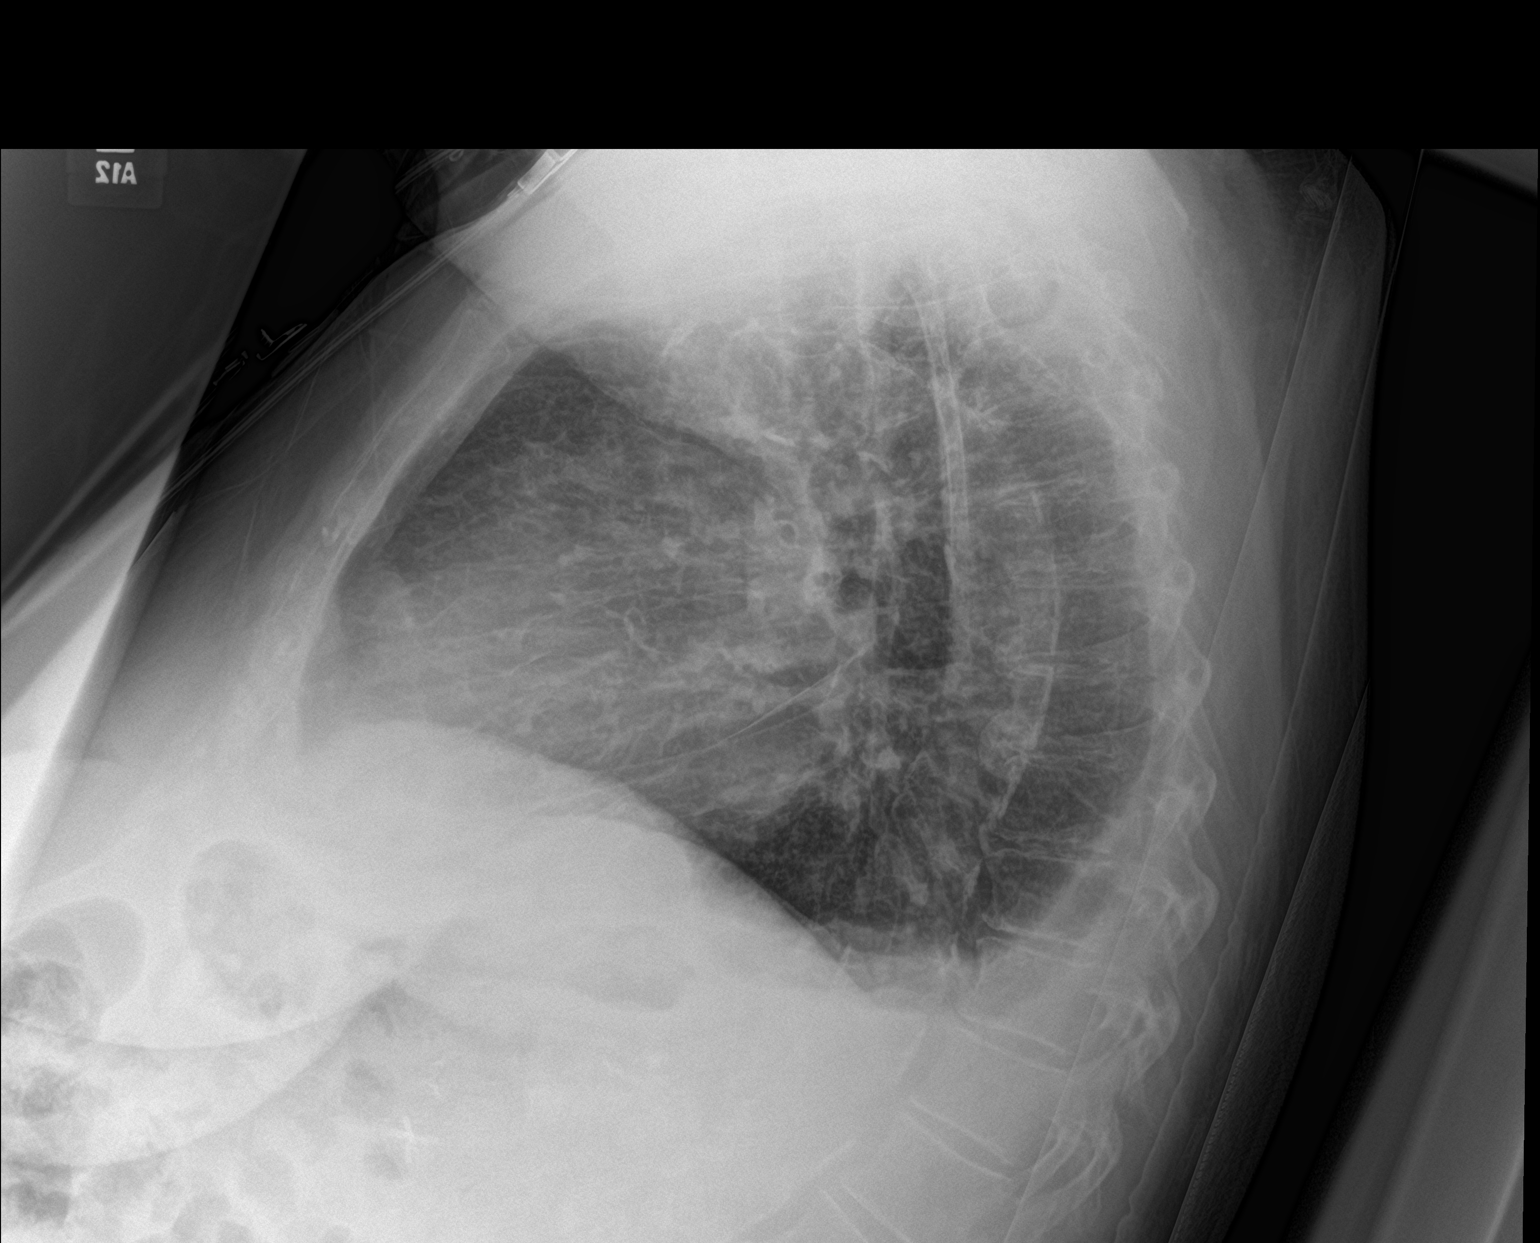

[chest ap]
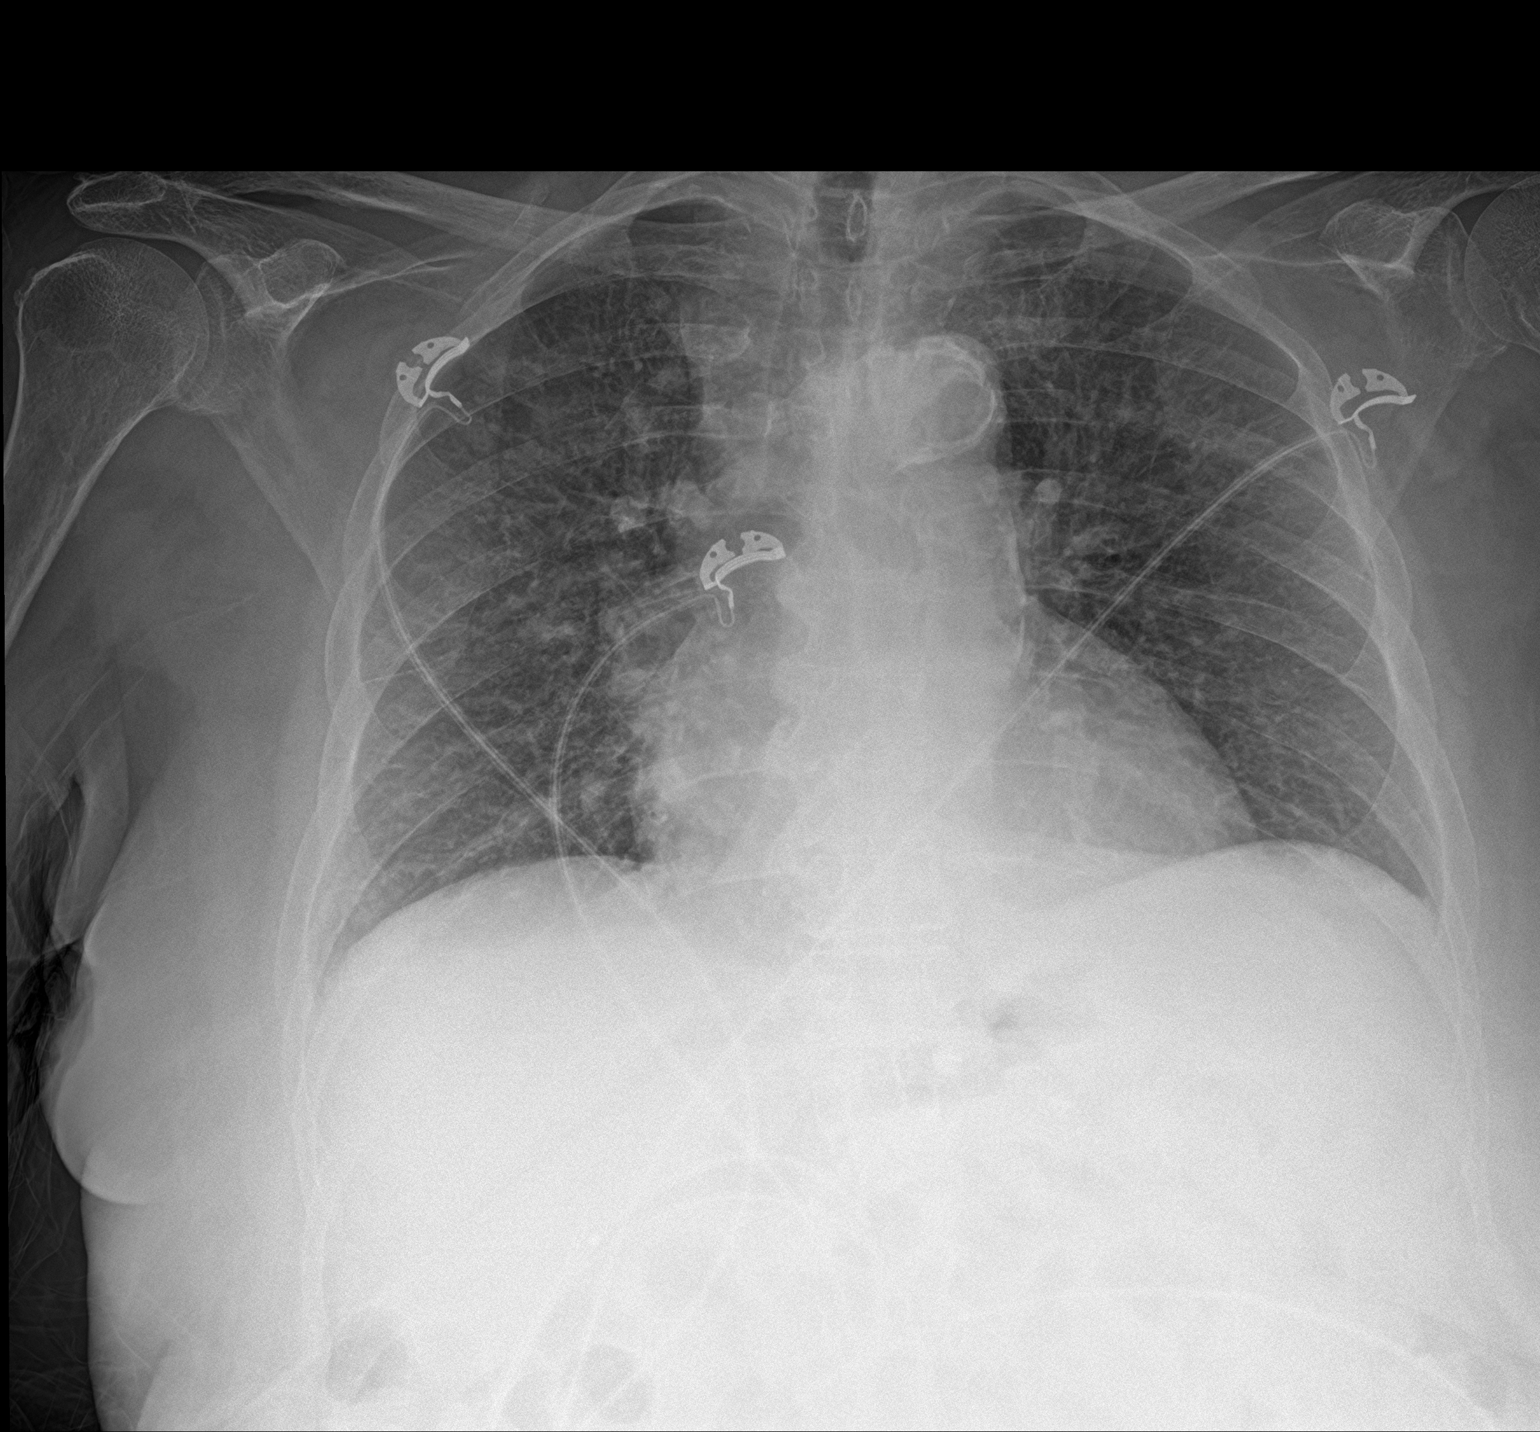

[2 of 2 positions shown; findings below may reference images not displayed]

FINDINGS: The heart is normal in size and stable. There is significant
tortuosity and calcification of the thoracic aorta. The pulmonary
hila appear grossly normal in stable. Chronic bronchitic type
interstitial lung changes along with streaky areas of atelectasis.
No focal pulmonary infiltrates. There are small bilateral pleural
effusions noted. The bony thorax is intact.
IMPRESSION: Chronic bronchitic type interstitial lung changes but no focal
infiltrate.

Small bilateral pleural effusions.

## 2018-09-26 ENCOUNTER — Ambulatory Visit (INDEPENDENT_AMBULATORY_CARE_PROVIDER_SITE_OTHER): Payer: Medicare Other | Admitting: Nurse Practitioner

## 2018-09-26 ENCOUNTER — Encounter (INDEPENDENT_AMBULATORY_CARE_PROVIDER_SITE_OTHER): Payer: Medicare Other

## 2018-10-16 ENCOUNTER — Encounter (INDEPENDENT_AMBULATORY_CARE_PROVIDER_SITE_OTHER): Payer: Self-pay | Admitting: Nurse Practitioner

## 2018-10-16 ENCOUNTER — Ambulatory Visit (INDEPENDENT_AMBULATORY_CARE_PROVIDER_SITE_OTHER): Payer: Medicare Other | Admitting: Vascular Surgery

## 2018-10-16 ENCOUNTER — Ambulatory Visit (INDEPENDENT_AMBULATORY_CARE_PROVIDER_SITE_OTHER): Payer: Medicare Other

## 2018-10-16 VITALS — BP 154/69 | HR 73 | Resp 16 | Ht <= 58 in | Wt 133.8 lb

## 2018-10-16 DIAGNOSIS — E782 Mixed hyperlipidemia: Secondary | ICD-10-CM

## 2018-10-16 DIAGNOSIS — N186 End stage renal disease: Secondary | ICD-10-CM

## 2018-10-16 DIAGNOSIS — E1122 Type 2 diabetes mellitus with diabetic chronic kidney disease: Secondary | ICD-10-CM

## 2018-10-16 DIAGNOSIS — E1169 Type 2 diabetes mellitus with other specified complication: Secondary | ICD-10-CM

## 2018-10-16 NOTE — Progress Notes (Signed)
Subjective:    Patient ID: Phyllis [name], female    DOB: 01/18/1946, 72 y.o.   MRN: [id number] Chief Complaint  Patient presents with  . Follow-up    ultrasound follow up   Patient presents for a 72-month HDA follow-up. The patient underwent a duplex ultrasound of the AV access which was notable for a patent fistula without any significant hemodynamic stenosis.  There is an area of narrowing in the distal forearm (392) however when compared to the previous study conducted on February 09, 2018 this is stable.  Total flow volume 1078 - this is also stable when compared to the previous exam.  The patient denies any issues with hemodialysis such as cannulation problems, increased bleeding, decrease in doppler flow or recirculation. The patient also denies any fistula skin breakdown, pain, edema, pallor or ulceration of the arm / hand.  She denies any uremic symptoms.  Patient denies any fever, nausea vomiting.  Review of Systems  Constitutional: Negative.   HENT: Negative.   Eyes: Negative.   Respiratory: Negative.   Cardiovascular: Negative.   Gastrointestinal: Negative.   Endocrine: Negative.   Genitourinary: Negative.   Musculoskeletal: Negative.   Skin: Negative.   Allergic/Immunologic: Negative.   Neurological: Negative.   Hematological: Negative.   Psychiatric/Behavioral: Negative.       Objective:   Physical Exam  Constitutional: She is oriented to person, place, and time. She appears well-developed and well-nourished. No distress.  HENT:  Head: Normocephalic and atraumatic.  Right Ear: External ear normal.  Left Ear: External ear normal.  Eyes: Pupils are equal, round, and reactive to light. Conjunctivae and EOM are normal.  Neck: Normal range of motion.  Cardiovascular: Normal rate, regular rhythm, normal heart sounds and intact distal pulses.  Pulses:      Radial pulses are 2+ on the right side, and 2+ on the left side.  Left radiocephalic AV fistula: Bruit and thrill.   Skin is intact.  Pulmonary/Chest: Effort normal and breath sounds normal.  Musculoskeletal: Normal range of motion. She exhibits no edema.  Neurological: She is alert and oriented to person, place, and time.  Skin: Skin is warm and dry. She is not diaphoretic.  Psychiatric: She has a normal mood and affect. Her behavior is normal. Judgment and thought content normal.  Vitals reviewed.  BP (!) 154/69 (BP Location: Right Arm)   Pulse 73   Resp 16   Ht 4\' 10"  (1.473 m)   Wt 133 lb 12.8 oz (60.7 kg)   BMI 27.96 kg/m   Past Medical History:  Diagnosis Date  . Allergy   . Anemia of chronic disease   . Anxiety   . Aortic atherosclerosis ([place])   . Asthma   . Chronic back pain   . COPD (chronic obstructive pulmonary disease) ([place])   . Diabetes mellitus with complication ([place])   . ESRD on hemodialysis ([place])    a. Tues/Sat; b. 2/2 small kidneys  . Essential hypertension   . Fistula    lower left arm  . GERD (gastroesophageal reflux disease)   . Gout   . History of echocardiogram    a. TTE 01/2014: nl LV sys fxn, no valvular abnormalities; b. TTE 11/16: nl EF, mild LVH  . History of exercise stress test    a. 01/2014: no evidence of ischemia; b. Lexiscan 08/2015: no sig ischemia, severe GI uptake artifact, low risk; c. CPET @ [facility] 09/2016: exercised 3 min 12 sec on bike without incline, 2.28 METs,  VO2 of 8.1, 48% of predicted, indicating mod to sev functional impairment, evidence of blunted HR, stroke volume, and BP augmentation as well as ventilation-perfusion mismatch with exercise  . HLD (hyperlipidemia)   . 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 Renal insufficiency 09/24/2017   Dialysis patient.  . Sleep apnea    Social History   Socioeconomic History  . Marital status: Single    Spouse name: Not on file  . Number of children: Not on file  . Years of education: Not on file  . Highest education level: Not on file  Occupational History  . Not on file    Social Needs  . Financial resource strain: Not on file  . Food insecurity:    Worry: Not on file    Inability: Not on file  . Transportation needs:    Medical: Not on file    Non-medical: Not on file  Tobacco Use  . Smoking status: Never Smoker  . Smokeless tobacco: Never Used  Substance and Sexual Activity  . Alcohol use: No  . Drug use: No  . Sexual activity: Not Currently  Lifestyle  . Physical activity:    Days per week: Not on file    Minutes per session: Not on file  . Stress: Not on file  Relationships  . Social connections:    Talks on phone: Not on file    Gets together: Not on file    Attends religious service: Not on file    Active member of club or organization: Not on file    Attends meetings of clubs or organizations: Not on file    Relationship status: Not on file  . Intimate partner violence:    Fear of current or ex partner: Not on file    Emotionally abused: Not on file    Physically abused: Not on file    Forced sexual activity: Not on file  Other Topics Concern  . Not on file  Social History Narrative  . Not on file   Past Surgical History:  Procedure Laterality Date  . carpel tunnel    . GALLBLADDER SURGERY    . PERIPHERAL VASCULAR CATHETERIZATION N/A 04/12/2015   Procedure: A/V Shuntogram/Fistulagram;  Surgeon: Algernon Huxley, MD;  Location: [facility];  Service: Cardiovascular;  Laterality: N/A;  . PERIPHERAL VASCULAR CATHETERIZATION N/A 04/12/2015   Procedure: A/V Shunt Intervention;  Surgeon: Algernon Huxley, MD;  Location: [facility];  Service: Cardiovascular;  Laterality: N/A;  . PERIPHERAL VASCULAR CATHETERIZATION N/A 06/09/2015   Procedure: Dialysis/Perma Catheter Removal;  Surgeon: Katha Cabal, MD;  Location: [facility];  Service: Cardiovascular;  Laterality: N/A;   Family History  Problem Relation Age of Onset  . Hypertension Mother   . Hyperlipidemia Mother   . Heart disease Father   . Heart attack  Father 29  . Hypertension Father   . Hyperlipidemia Father   . Heart disease Brother        CABG   . Heart attack Brother   . Breast cancer Neg Hx    Allergies  Allergen Reactions  . Codeine Nausea And Vomiting and Nausea Only  . Enalapril Maleate     Other reaction(s): Headache  . Nitrofurantoin Swelling and Rash    Other Reaction: swelling of body  . Sulfamethoxazole-Trimethoprim Swelling  . Vicodin [Hydrocodone-Acetaminophen] Nausea And Vomiting and Nausea Only  . 2,4-D Dimethylamine (Amisol) Rash    Other Reaction:  h/a  . Baclofen Other (See Comments) and Nausea Only    lightheadness ,drowsiness , muscle weakness , twitching in hands   . Neosporin [Neomycin-Bacitracin Zn-Polymyx] Other (See Comments) and Rash    Other Reaction: irritation Skin irritation   . Quinine Rash    Other Reaction: Vomiting rash, h/a, vision  . Ultram [Tramadol] Palpitations  . Zocor [Simvastatin] Other (See Comments) and Rash    Other Reaction: muscle spasms Muscle pain and spasms  . Bactrim [Sulfamethoxazole-Trimethoprim] Swelling  . Levodopa   . Macrodantin [Nitrofurantoin Macrocrystal] Swelling  . Quinine Derivatives Other (See Comments)    Vertigo,nausea vomiting blurred vision headache ears sensitivity   . Vasotec [Enalapril] Other (See Comments)    Headaches       Assessment & Plan:  Patient presents for a 21-month HDA follow-up. The patient underwent a duplex ultrasound of the AV access which was notable for a patent fistula without any significant hemodynamic stenosis.  There is an area of narrowing in the distal forearm (392) however when compared to the previous study conducted on February 09, 2018 this is stable.  Total flow volume 1078 - this is also stable when compared to the previous exam.  The patient denies any issues with hemodialysis such as cannulation problems, increased bleeding, decrease in doppler flow or recirculation. The patient also denies any fistula skin breakdown,  pain, edema, pallor or ulceration of the arm / hand.  She denies any uremic symptoms.  Patient denies any fever, nausea vomiting.  1. End stage renal disease ([place]) - Stable Studies reviewed with patient. The patient is doing well and currently has adequate dialysis access. Duplex ultrasound of the AV access shows a patent access with no evidence of hemodynamically significant strictures or stenosis.  The patient should continue to have duplex ultrasounds of the dialysis access every six months. The patient was instructed to call the office in the interim if any issues with dialysis access / doppler flow, pain, edema, pallor, fistula skin breakdown or ulceration of the arm / hand occur. The patient expressed their understanding.  - VAS US DUPLEX DIALYSIS ACCESS (AVF,AVG); Future  2. Type 2 diabetes mellitus with other specified complication, unspecified whether long term insulin use (HCC) - Stable On appropriate medications Encouraged good control as its slows the progression of atherosclerotic disease  3. Mixed hyperlipidemia - Stable On appropriate medications Encouraged good control as its slows the progression of atherosclerotic disease  Current Outpatient Medications on File Prior to Visit  Medication Sig Dispense Refill  . albuterol (PROVENTIL HFA;VENTOLIN HFA) 108 (90 Base) MCG/ACT inhaler Inhale 2 puffs into the lungs every 6 (six) hours as needed for wheezing or shortness of breath.    Marland Kitchen amLODipine (NORVASC) 10 MG tablet Take 10 mg by mouth.     Marland Kitchen aspirin EC 81 MG EC tablet Take 1 tablet (81 mg total) by mouth daily.    . budesonide-formoterol (SYMBICORT) 160-4.5 MCG/ACT inhaler Inhale into the lungs.    . calcium acetate (PHOSLO) 667 MG capsule Take 667 mg by mouth 3 (three) times daily with meals.     . carvedilol (COREG) 12.5 MG tablet Take 12.5 mg by mouth 2 (two) times daily with a meal.     . cetirizine (ZYRTEC) 10 MG tablet Take 10 mg by mouth as needed for allergies.    Marland Kitchen  clopidogrel (PLAVIX) 75 MG tablet Take 1 tablet (75 mg total) by mouth daily. 30 tablet 0  . cyclobenzaprine (FLEXERIL) 10 MG tablet Take  10 mg by mouth 3 (three) times daily as needed for muscle spasms.    Marland Kitchen docusate sodium (COLACE) 100 MG capsule Take 100 mg by mouth 3 (three) times daily as needed for mild constipation.    . Fluticasone-Salmeterol (ADVAIR) 250-50 MCG/DOSE AEPB Inhale 1 puff into the lungs 2 (two) times daily.    . furosemide (LASIX) 80 MG tablet Take 1 tablet (80 mg total) by mouth daily. 30 tablet 0  . HYDROcodone-acetaminophen (NORCO) 5-325 MG tablet Take 1 tablet by mouth every 6 (six) hours as needed for moderate pain or severe pain. 10 tablet 0  . lidocaine-prilocaine (EMLA) cream Apply 1 application topically as needed.    . nitroGLYCERIN (NITROSTAT) 0.4 MG SL tablet Place 1 tablet (0.4 mg total) under the tongue every 5 (five) minutes as needed for chest pain. 25 tablet 1  . omeprazole (PRILOSEC) 20 MG capsule Take 20 mg by mouth 2 (two) times daily before a meal.    . ondansetron (ZOFRAN) 4 MG tablet Take 4 mg by mouth every 8 (eight) hours as needed for nausea or vomiting.    Marland Kitchen oxybutynin (DITROPAN XL) 15 MG 24 hr tablet Take 15 mg by mouth daily.    . pravastatin (PRAVACHOL) 40 MG tablet Take 40 mg by mouth daily.     Marland Kitchen senna (SENOKOT) 8.6 MG tablet Take 1 tablet by mouth daily.    Marland Kitchen topiramate (TOPAMAX) 25 MG tablet Take 25 mg by mouth as needed.     . Vitamin D, Ergocalciferol, (DRISDOL) 50000 UNITS CAPS capsule Take 50,000 Units by mouth every Wednesday.      No current facility-administered medications on file prior to visit.    There are no Patient Instructions on file for this visit. Return in about 6 months (around 04/16/2019), or if symptoms worsen or fail to improve.  Jermika Olden A Lulubelle Simcoe, PA-C

## 2018-10-21 ENCOUNTER — Emergency Department: Payer: Medicare Other

## 2018-10-21 ENCOUNTER — Inpatient Hospital Stay
Admission: EM | Admit: 2018-10-21 | Discharge: 2018-10-23 | DRG: 698 | Disposition: A | Discharge status: Home / Self Care | Payer: Medicare Other | Attending: Internal Medicine | Admitting: Internal Medicine

## 2018-10-21 ENCOUNTER — Encounter: Payer: Self-pay | Admitting: *Deleted

## 2018-10-21 ENCOUNTER — Other Ambulatory Visit: Payer: Self-pay

## 2018-10-21 DIAGNOSIS — J9600 Acute respiratory failure, unspecified whether with hypoxia or hypercapnia: Secondary | ICD-10-CM

## 2018-10-21 DIAGNOSIS — G4733 Obstructive sleep apnea (adult) (pediatric): Secondary | ICD-10-CM | POA: Diagnosis present

## 2018-10-21 DIAGNOSIS — Z8709 Personal history of other diseases of the respiratory system: Secondary | ICD-10-CM | POA: Diagnosis present

## 2018-10-21 DIAGNOSIS — K219 Gastro-esophageal reflux disease without esophagitis: Secondary | ICD-10-CM | POA: Diagnosis present

## 2018-10-21 DIAGNOSIS — J189 Pneumonia, unspecified organism: Secondary | ICD-10-CM

## 2018-10-21 DIAGNOSIS — I10 Essential (primary) hypertension: Secondary | ICD-10-CM | POA: Diagnosis present

## 2018-10-21 DIAGNOSIS — Z8349 Family history of other endocrine, nutritional and metabolic diseases: Secondary | ICD-10-CM

## 2018-10-21 DIAGNOSIS — I700 Atherosclerosis of aorta: Secondary | ICD-10-CM | POA: Diagnosis present

## 2018-10-21 DIAGNOSIS — J9621 Acute and chronic respiratory failure with hypoxia: Secondary | ICD-10-CM | POA: Diagnosis present

## 2018-10-21 DIAGNOSIS — I132 Hypertensive heart and chronic kidney disease with heart failure and with stage 5 chronic kidney disease, or end stage renal disease: Secondary | ICD-10-CM | POA: Diagnosis present

## 2018-10-21 DIAGNOSIS — E875 Hyperkalemia: Secondary | ICD-10-CM | POA: Diagnosis present

## 2018-10-21 DIAGNOSIS — Z7982 Long term (current) use of aspirin: Secondary | ICD-10-CM | POA: Diagnosis not present

## 2018-10-21 DIAGNOSIS — Z7951 Long term (current) use of inhaled steroids: Secondary | ICD-10-CM | POA: Diagnosis not present

## 2018-10-21 DIAGNOSIS — E1122 Type 2 diabetes mellitus with diabetic chronic kidney disease: Secondary | ICD-10-CM | POA: Diagnosis present

## 2018-10-21 DIAGNOSIS — Z885 Allergy status to narcotic agent status: Secondary | ICD-10-CM

## 2018-10-21 DIAGNOSIS — Z7902 Long term (current) use of antithrombotics/antiplatelets: Secondary | ICD-10-CM

## 2018-10-21 DIAGNOSIS — I5033 Acute on chronic diastolic (congestive) heart failure: Secondary | ICD-10-CM | POA: Diagnosis present

## 2018-10-21 DIAGNOSIS — Z992 Dependence on renal dialysis: Secondary | ICD-10-CM

## 2018-10-21 DIAGNOSIS — J810 Acute pulmonary edema: Secondary | ICD-10-CM | POA: Diagnosis present

## 2018-10-21 DIAGNOSIS — I248 Other forms of acute ischemic heart disease: Secondary | ICD-10-CM | POA: Diagnosis present

## 2018-10-21 DIAGNOSIS — Z883 Allergy status to other anti-infective agents status: Secondary | ICD-10-CM | POA: Diagnosis not present

## 2018-10-21 DIAGNOSIS — Z79899 Other long term (current) drug therapy: Secondary | ICD-10-CM | POA: Diagnosis not present

## 2018-10-21 DIAGNOSIS — E1169 Type 2 diabetes mellitus with other specified complication: Secondary | ICD-10-CM | POA: Diagnosis present

## 2018-10-21 DIAGNOSIS — Z888 Allergy status to other drugs, medicaments and biological substances status: Secondary | ICD-10-CM

## 2018-10-21 DIAGNOSIS — D631 Anemia in chronic kidney disease: Secondary | ICD-10-CM | POA: Diagnosis present

## 2018-10-21 DIAGNOSIS — N186 End stage renal disease: Secondary | ICD-10-CM | POA: Diagnosis present

## 2018-10-21 DIAGNOSIS — J449 Chronic obstructive pulmonary disease, unspecified: Secondary | ICD-10-CM | POA: Diagnosis present

## 2018-10-21 DIAGNOSIS — Z8249 Family history of ischemic heart disease and other diseases of the circulatory system: Secondary | ICD-10-CM | POA: Diagnosis not present

## 2018-10-21 DIAGNOSIS — E785 Hyperlipidemia, unspecified: Secondary | ICD-10-CM | POA: Diagnosis present

## 2018-10-21 DIAGNOSIS — Z882 Allergy status to sulfonamides status: Secondary | ICD-10-CM

## 2018-10-21 DIAGNOSIS — J9601 Acute respiratory failure with hypoxia: Secondary | ICD-10-CM

## 2018-10-21 DIAGNOSIS — N2581 Secondary hyperparathyroidism of renal origin: Secondary | ICD-10-CM | POA: Diagnosis present

## 2018-10-21 LAB — BASIC METABOLIC PANEL
Anion gap: 11 (ref 5–15)
BUN: 43 mg/dL — ABNORMAL HIGH (ref 8–23)
CO2: 26 mmol/L (ref 22–32)
Calcium: 8.1 mg/dL — ABNORMAL LOW (ref 8.9–10.3)
Chloride: 104 mmol/L (ref 98–111)
Creatinine, Ser: 7.51 mg/dL — ABNORMAL HIGH (ref 0.44–1.00)
GFR calc Af Amer: 6 mL/min — ABNORMAL LOW (ref >60)
GFR calc non Af Amer: 5 mL/min — ABNORMAL LOW (ref >60)
Glucose, Bld: 167 mg/dL — ABNORMAL HIGH (ref 70–99)
Potassium: 5.5 mmol/L — ABNORMAL HIGH (ref 3.5–5.1)
Sodium: 141 mmol/L (ref 135–145)

## 2018-10-21 LAB — CBC
HCT: 35.9 % — ABNORMAL LOW (ref 36.0–46.0)
Hemoglobin: 11.3 g/dL — ABNORMAL LOW (ref 12.0–15.0)
MCH: 29.4 pg (ref 26.0–34.0)
MCHC: 31.5 g/dL (ref 30.0–36.0)
MCV: 93.2 fL (ref 80.0–100.0)
Platelets: 166 10*3/uL (ref 150–400)
RBC: 3.85 MIL/uL — ABNORMAL LOW (ref 3.87–5.11)
RDW: 14.1 % (ref 11.5–15.5)
WBC: 9.6 10*3/uL (ref 4.0–10.5)
nRBC: 0 % (ref 0.0–0.2)

## 2018-10-21 LAB — BLOOD GAS, VENOUS
Acid-Base Excess: 2 mmol/L (ref 0.0–2.0)
Bicarbonate: 27.8 mmol/L (ref 20.0–28.0)
Delivery systems: POSITIVE
FIO2: 0.4
Mechanical Rate: 8
O2 Saturation: 82.6 %
Patient temperature: 37
pCO2, Ven: 47 mmHg (ref 44.0–60.0)
pH, Ven: 7.38 (ref 7.250–7.430)
pO2, Ven: 48 mmHg — ABNORMAL HIGH (ref 32.0–45.0)

## 2018-10-21 LAB — GLUCOSE, CAPILLARY: Glucose-Capillary: 127 mg/dL — ABNORMAL HIGH (ref 70–99)

## 2018-10-21 LAB — TROPONIN I: Troponin I: 0.04 ng/mL (ref <0.03)

## 2018-10-21 LAB — CG4 I-STAT (LACTIC ACID): Lactic Acid, Venous: 1.37 mmol/L (ref 0.5–1.9)

## 2018-10-21 LAB — PROTIME-INR
INR: 1.01
Prothrombin Time: 13.2 seconds (ref 11.4–15.2)

## 2018-10-21 MED ORDER — DEXTROSE 50 % IV SOLN
1.0000 | Freq: Once | INTRAVENOUS | Status: AC
  Administered 2018-10-21: 50 mL via INTRAVENOUS
  Filled 2018-10-21: qty 50

## 2018-10-21 MED ORDER — HEPARIN SODIUM (PORCINE) 5000 UNIT/ML IJ SOLN
5000.0000 [IU] | Freq: Three times a day (TID) | INTRAMUSCULAR | Status: DC
  Administered 2018-10-22: 5000 [IU] via SUBCUTANEOUS
  Filled 2018-10-21 (×4): qty 1

## 2018-10-21 MED ORDER — INSULIN ASPART 100 UNIT/ML ~~LOC~~ SOLN: 0.0000 [IU] | Freq: Three times a day (TID) | SUBCUTANEOUS | Status: DC

## 2018-10-21 MED ORDER — INSULIN ASPART 100 UNIT/ML ~~LOC~~ SOLN
10.0000 [IU] | Freq: Once | SUBCUTANEOUS | Status: AC
  Administered 2018-10-21: 10 [IU] via INTRAVENOUS
  Filled 2018-10-21: qty 1

## 2018-10-21 MED ORDER — HYDROCODONE-ACETAMINOPHEN 7.5-325 MG PO TABS
1.0000 | ORAL_TABLET | Freq: Two times a day (BID) | ORAL | Status: DC | PRN
  Administered 2018-10-22 – 2018-10-23 (×3): 1 via ORAL
  Filled 2018-10-21 (×3): qty 1

## 2018-10-21 MED ORDER — ALBUTEROL SULFATE (2.5 MG/3ML) 0.083% IN NEBU
2.5000 mg | INHALATION_SOLUTION | Freq: Once | RESPIRATORY_TRACT | Status: AC
  Administered 2018-10-21: 2.5 mg via RESPIRATORY_TRACT

## 2018-10-21 MED ORDER — TOPIRAMATE 25 MG PO TABS
25.0000 mg | ORAL_TABLET | Freq: Every day | ORAL | Status: DC | PRN
  Filled 2018-10-21: qty 1

## 2018-10-21 MED ORDER — CARVEDILOL 12.5 MG PO TABS
12.5000 mg | ORAL_TABLET | Freq: Two times a day (BID) | ORAL | Status: DC
  Administered 2018-10-22: 12.5 mg via ORAL
  Filled 2018-10-21: qty 1

## 2018-10-21 MED ORDER — NITROGLYCERIN IN D5W 200-5 MCG/ML-% IV SOLN
0.0000 ug/min | INTRAVENOUS | Status: DC
  Administered 2018-10-21: 20 ug/min via INTRAVENOUS

## 2018-10-21 MED ORDER — ONDANSETRON HCL 4 MG/2ML IJ SOLN: 4.0000 mg | Freq: Four times a day (QID) | INTRAMUSCULAR | Status: DC | PRN

## 2018-10-21 MED ORDER — CHLORHEXIDINE GLUCONATE CLOTH 2 % EX PADS
6.0000 | MEDICATED_PAD | Freq: Every day | CUTANEOUS | Status: DC
  Filled 2018-10-21: qty 6

## 2018-10-21 MED ORDER — INSULIN ASPART 100 UNIT/ML ~~LOC~~ SOLN: 0.0000 [IU] | Freq: Every day | SUBCUTANEOUS | Status: DC

## 2018-10-21 MED ORDER — ASPIRIN EC 81 MG PO TBEC
81.0000 mg | DELAYED_RELEASE_TABLET | Freq: Every day | ORAL | Status: DC
  Administered 2018-10-22 – 2018-10-23 (×2): 81 mg via ORAL
  Filled 2018-10-21 (×2): qty 1

## 2018-10-21 MED ORDER — ACETAMINOPHEN 325 MG PO TABS
650.0000 mg | ORAL_TABLET | Freq: Four times a day (QID) | ORAL | Status: DC | PRN
  Administered 2018-10-21: 650 mg via ORAL
  Filled 2018-10-21: qty 2

## 2018-10-21 MED ORDER — ALTEPLASE 2 MG IJ SOLR
2.0000 mg | Freq: Once | INTRAMUSCULAR | Status: DC | PRN
  Filled 2018-10-21: qty 2

## 2018-10-21 MED ORDER — VANCOMYCIN HCL IN DEXTROSE 1-5 GM/200ML-% IV SOLN
1000.0000 mg | Freq: Once | INTRAVENOUS | Status: AC
  Administered 2018-10-21: 1000 mg via INTRAVENOUS
  Filled 2018-10-21: qty 200

## 2018-10-21 MED ORDER — LIDOCAINE HCL (PF) 1 % IJ SOLN
5.0000 mL | INTRAMUSCULAR | Status: DC | PRN
  Filled 2018-10-21: qty 5

## 2018-10-21 MED ORDER — IPRATROPIUM-ALBUTEROL 0.5-2.5 (3) MG/3ML IN SOLN: 3.0000 mL | RESPIRATORY_TRACT | Status: DC | PRN

## 2018-10-21 MED ORDER — PENTAFLUOROPROP-TETRAFLUOROETH EX AERO
1.0000 "application " | INHALATION_SPRAY | CUTANEOUS | Status: DC | PRN
  Filled 2018-10-21: qty 30

## 2018-10-21 MED ORDER — FUROSEMIDE 40 MG PO TABS
80.0000 mg | ORAL_TABLET | Freq: Every day | ORAL | Status: DC
  Administered 2018-10-22 – 2018-10-23 (×2): 80 mg via ORAL
  Filled 2018-10-21: qty 4
  Filled 2018-10-21 (×2): qty 2

## 2018-10-21 MED ORDER — ACETAMINOPHEN 650 MG RE SUPP: 650.0000 mg | Freq: Four times a day (QID) | RECTAL | Status: DC | PRN

## 2018-10-21 MED ORDER — PRAVASTATIN SODIUM 20 MG PO TABS
40.0000 mg | ORAL_TABLET | Freq: Every day | ORAL | Status: DC
  Administered 2018-10-21 – 2018-10-22 (×2): 40 mg via ORAL
  Filled 2018-10-21 (×2): qty 2

## 2018-10-21 MED ORDER — HEPARIN SODIUM (PORCINE) 1000 UNIT/ML DIALYSIS
1000.0000 [IU] | INTRAMUSCULAR | Status: DC | PRN
  Filled 2018-10-21: qty 1

## 2018-10-21 MED ORDER — SODIUM CHLORIDE 0.9 % IV SOLN: 100.0000 mL | INTRAVENOUS | Status: DC | PRN

## 2018-10-21 MED ORDER — METHYLPREDNISOLONE SODIUM SUCC 125 MG IJ SOLR
125.0000 mg | Freq: Once | INTRAMUSCULAR | Status: AC
  Administered 2018-10-21: 125 mg via INTRAVENOUS

## 2018-10-21 MED ORDER — MOMETASONE FURO-FORMOTEROL FUM 200-5 MCG/ACT IN AERO
2.0000 | INHALATION_SPRAY | Freq: Two times a day (BID) | RESPIRATORY_TRACT | Status: DC
  Filled 2018-10-21 (×2): qty 8.8

## 2018-10-21 MED ORDER — INSULIN ASPART 100 UNIT/ML ~~LOC~~ SOLN: 0.0000 [IU] | Freq: Four times a day (QID) | SUBCUTANEOUS | Status: DC

## 2018-10-21 MED ORDER — LIDOCAINE-PRILOCAINE 2.5-2.5 % EX CREA
1.0000 "application " | TOPICAL_CREAM | CUTANEOUS | Status: DC | PRN
  Filled 2018-10-21: qty 5

## 2018-10-21 MED ORDER — CLOPIDOGREL BISULFATE 75 MG PO TABS
75.0000 mg | ORAL_TABLET | Freq: Every day | ORAL | Status: DC
  Administered 2018-10-22 – 2018-10-23 (×2): 75 mg via ORAL
  Filled 2018-10-21 (×2): qty 1

## 2018-10-21 MED ORDER — ONDANSETRON HCL 4 MG PO TABS
4.0000 mg | ORAL_TABLET | Freq: Four times a day (QID) | ORAL | Status: DC | PRN
  Administered 2018-10-22: 4 mg via ORAL
  Filled 2018-10-21: qty 1

## 2018-10-21 MED ORDER — SODIUM CHLORIDE 0.9 % IV SOLN
1.0000 g | Freq: Once | INTRAVENOUS | Status: AC
  Administered 2018-10-21: 1 g via INTRAVENOUS
  Filled 2018-10-21: qty 1

## 2018-10-21 MED ORDER — AMLODIPINE BESYLATE 10 MG PO TABS
10.0000 mg | ORAL_TABLET | Freq: Every day | ORAL | Status: DC
  Administered 2018-10-22 – 2018-10-23 (×2): 10 mg via ORAL
  Filled 2018-10-21 (×2): qty 1

## 2018-10-21 MED ORDER — SODIUM BICARBONATE 8.4 % IV SOLN
50.0000 meq | Freq: Once | INTRAVENOUS | Status: AC
  Administered 2018-10-21: 50 meq via INTRAVENOUS
  Filled 2018-10-21: qty 50

## 2018-10-21 MED ORDER — FUROSEMIDE 10 MG/ML IJ SOLN
80.0000 mg | Freq: Once | INTRAMUSCULAR | Status: AC
  Administered 2018-10-21: 80 mg via INTRAVENOUS
  Filled 2018-10-21: qty 8

## 2018-10-21 MED ORDER — PANTOPRAZOLE SODIUM 40 MG PO PACK
20.0000 mg | PACK | Freq: Two times a day (BID) | ORAL | Status: DC
  Administered 2018-10-22: 20 mg via ORAL
  Filled 2018-10-21 (×7): qty 20

## 2018-10-21 NOTE — Progress Notes (Signed)
[facility] Progress Note [name] DOB: July 13, 1946 MRN: [id number]   Date of Service  10/21/2018  HPI/Events of Note  72 yo female with ESRD  eICU Interventions       Intervention Category Evaluation Type: New Patient Evaluation  [name] 10/21/2018, 11:17 PM

## 2018-10-21 NOTE — ED Notes (Signed)
Pt alert  Waiting for admission.  nsr on monitor   Iv meds infusing.

## 2018-10-21 NOTE — ED Provider Notes (Signed)
[facility] Emergency Department Provider Note  ____________________________________________  Time seen: Approximately 8:36 PM  I have reviewed the triage vital signs and the nursing notes.   HISTORY  Chief Complaint Respiratory Distress   HPI Phyllis [name] is a 72 y.o. female with a history of ESRD on HD (T,S), COPD, CHF, diabetes, asthma, anemia who presents for evaluation of shortness of breath.  Patient reports that she was watching TV when she developed sudden onset of shortness of breath.  When EMS arrived the patient was satting 65% on room air, blood pressure was 230/100.  Patient was transported on CPAP.  She has not missed dialysis with her last treatment being 2 days ago.  She denies fever, chills, cough, chest pain.  She also noticed swelling on bilateral lower extremities which started this evening.  No prior history of PE or DVT, no recent travel immobilization, no leg pain, no hemoptysis, no exogenous hormones.  Patient still makes urine.  She endorses compliance with her Lasix.  Past Medical History:  Diagnosis Date  . Allergy   . Anemia of chronic disease   . Anxiety   . Aortic atherosclerosis ([place])   . Asthma   . Chronic back pain   . COPD (chronic obstructive pulmonary disease) ([place])   . Diabetes mellitus with complication ([place])   . ESRD on hemodialysis ([place])    a. Tues/Sat; b. 2/2 small kidneys  . Essential hypertension   . Fistula    lower left arm  . GERD (gastroesophageal reflux disease)   . Gout   . History of echocardiogram    a. TTE 01/2014: nl LV sys fxn, no valvular abnormalities; b. TTE 11/16: nl EF, mild LVH  . History of exercise stress test    a. 01/2014: no evidence of ischemia; b. Lexiscan 08/2015: no sig ischemia, severe GI uptake artifact, low risk; c. CPET @ [facility] 09/2016: exercised 3 min 12 sec on bike without incline, 2.28 METs, VO2 of 8.1, 48% of predicted, indicating mod to sev functional impairment, evidence  of blunted HR, stroke volume, and BP augmentation as well as ventilation-perfusion mismatch with exercise  . HLD (hyperlipidemia)   . 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 Renal insufficiency 09/24/2017   Dialysis patient.  . Sleep apnea     Patient Active Problem List   Diagnosis Date Noted  . Accelerated hypertension 10/21/2018  . Flash pulmonary edema ([place]) 10/21/2018  . Pressure injury of skin 03/16/2018  . CVA (cerebral vascular accident) ([place]) 01/16/2018  . Acute respiratory failure with hypoxia ([place]) 01/16/2018  . Aortic atherosclerosis ([place]) 12/11/2017  . Chest pain 09/18/2017  . Chest discomfort 08/06/2015  . Hyperlipidemia 08/06/2015  . Complication from renal dialysis device 04/12/2015  . SOB (shortness of breath) 02/01/2015  . End stage renal disease on dialysis ([place]) 02/01/2015  . Type 2 diabetes mellitus with other specified complication ([place]) 23/55/7322  . Asthma 02/01/2015  . Acute on chronic diastolic CHF (congestive heart failure) ([place]) 02/01/2015    Past Surgical History:  Procedure Laterality Date  . carpel tunnel    . GALLBLADDER SURGERY    . PERIPHERAL VASCULAR CATHETERIZATION N/A 04/12/2015   Procedure: A/V Shuntogram/Fistulagram;  Surgeon: Algernon Huxley, MD;  Location: [place] [facility];  Service: Cardiovascular;  Laterality: N/A;  . PERIPHERAL VASCULAR CATHETERIZATION N/A 04/12/2015   Procedure: A/V Shunt Intervention;  Surgeon: Algernon Huxley, MD;  Location: [place] [facility];  Service:  Cardiovascular;  Laterality: N/A;  . PERIPHERAL VASCULAR CATHETERIZATION N/A 06/09/2015   Procedure: Dialysis/Perma Catheter Removal;  Surgeon: Katha Cabal, MD;  Location: [place] [facility];  Service: Cardiovascular;  Laterality: N/A;    Prior to Admission medications   Medication Sig Start Date End Date Taking? Authorizing Provider  albuterol (PROVENTIL HFA;VENTOLIN HFA) 108 (90 Base) MCG/ACT inhaler Inhale 2 puffs into the lungs every  6 (six) hours as needed for wheezing or shortness of breath.    [provider]  amLODipine (NORVASC) 10 MG tablet Take 10 mg by mouth.  12/29/13   [provider]  aspirin EC 81 MG EC tablet Take 1 tablet (81 mg total) by mouth daily. 09/20/17   Bettey Costa, MD  calcium acetate (PHOSLO) 667 MG capsule Take 667 mg by mouth 3 (three) times daily with meals.     [provider]  carvedilol (COREG) 12.5 MG tablet Take 12.5 mg by mouth 2 (two) times daily with a meal.  01/09/14   [provider]  cetirizine (ZYRTEC) 10 MG tablet Take 10 mg by mouth as needed for allergies.    [provider]  clopidogrel (PLAVIX) 75 MG tablet Take 1 tablet (75 mg total) by mouth daily. 01/18/18   Epifanio Lesches, MD  cyclobenzaprine (FLEXERIL) 10 MG tablet Take 10 mg by mouth 3 (three) times daily as needed for muscle spasms.    [provider]  docusate sodium (COLACE) 100 MG capsule Take 100 mg by mouth 3 (three) times daily as needed for mild constipation.    [provider]  Fluticasone-Salmeterol (ADVAIR) 250-50 MCG/DOSE AEPB Inhale 1 puff into the lungs 2 (two) times daily.    [provider]  furosemide (LASIX) 80 MG tablet Take 1 tablet (80 mg total) by mouth daily. 03/17/18   Loletha Grayer, MD  lidocaine-prilocaine (EMLA) cream Apply 1 application topically as needed.    [provider]  nitroGLYCERIN (NITROSTAT) 0.4 MG SL tablet Place 1 tablet (0.4 mg total) under the tongue every 5 (five) minutes as needed for chest pain. 09/21/17   Strader, Fransisco Hertz, PA-C  omeprazole (PRILOSEC) 20 MG capsule Take 20 mg by mouth 2 (two) times daily before a meal.    [provider]  oxybutynin (DITROPAN XL) 15 MG 24 hr tablet Take 15 mg by mouth daily. 12/24/17   [provider]  pravastatin (PRAVACHOL) 40 MG tablet Take 40 mg by mouth daily.  12/29/13   [provider]  senna (SENOKOT) 8.6 MG tablet Take 1 tablet by mouth  daily.    [provider]  topiramate (TOPAMAX) 25 MG tablet Take 25 mg by mouth as needed.  01/14/14   [provider]  Vitamin D, Ergocalciferol, (DRISDOL) 50000 UNITS CAPS capsule Take 50,000 Units by mouth every Wednesday.  12/29/13   [provider]    Allergies Codeine; Enalapril maleate; Nitrofurantoin; Sulfamethoxazole-trimethoprim; Vicodin [hydrocodone-acetaminophen]; 2,4-d dimethylamine (amisol); Baclofen; Neosporin [neomycin-bacitracin zn-polymyx]; Quinine; Ultram [tramadol]; Zocor [simvastatin]; Bactrim [sulfamethoxazole-trimethoprim]; Levodopa; Macrodantin [nitrofurantoin macrocrystal]; Quinine derivatives; and Vasotec [enalapril]  Family History  Problem Relation Age of Onset  . Hypertension Mother   . Hyperlipidemia Mother   . Heart disease Father   . Heart attack Father 13  . Hypertension Father   . Hyperlipidemia Father   . Heart disease Brother        CABG   . Heart attack Brother   . Breast cancer Neg Hx     Social History Social History  Tobacco Use  . Smoking status: Never Smoker  . Smokeless tobacco: Never Used  Substance Use Topics  . Alcohol use: No  . Drug use: No    Review of Systems  Constitutional: Negative for fever. Eyes: Negative for visual changes. ENT: Negative for sore throat. Neck: No neck pain  Cardiovascular: Negative for chest pain. Respiratory: + shortness of breath. Gastrointestinal: Negative for abdominal pain, vomiting or diarrhea. Genitourinary: Negative for dysuria. Musculoskeletal: Negative for back pain. + b/l leg swelling Skin: Negative for rash. Neurological: Negative for headaches, weakness or numbness. Psych: No SI or HI  ____________________________________________   PHYSICAL EXAM:  VITAL SIGNS: ED Triage Vitals  Enc Vitals Group     BP 10/21/18 1921 (!) 173/79     Pulse Rate 10/21/18 1912 (!) 107     Resp 10/21/18 1912 (!) 32     Temp --      Temp Source 10/21/18 1921 Oral      SpO2 10/21/18 1912 100 %     Weight 10/21/18 1912 133 lb (60.3 kg)     Height 10/21/18 1912 4\' 10"  (1.473 m)     Head Circumference --      Peak Flow --      Pain Score 10/21/18 1911 0     Pain Loc --      Pain Edu? --      Excl. in [place]? --     Constitutional: Alert and oriented, severe respiratory distress.  HEENT:      Head: Normocephalic and atraumatic.         Eyes: Conjunctivae are normal. Sclera is non-icteric.       Mouth/Throat: Mucous membranes are moist.       Neck: Supple with no signs of meningismus. Cardiovascular: Tachycardic with regular rhythm and elevated JVD Respiratory: Increased work of breathing, hypoxic on room air, bilateral crackles on exam with good air movement. Gastrointestinal: Soft, non tender, and non distended with positive bowel sounds. No rebound or guarding. Musculoskeletal: 2+ pitting edema in bilateral lower extremities Neurologic: Normal speech and language. Face is symmetric. Moving all extremities. No gross focal neurologic deficits are appreciated. Skin: Skin is warm, dry and intact. No rash noted. Psychiatric: Mood and affect are normal. Speech and behavior are normal.  ____________________________________________   LABS (all labs ordered are listed, but only abnormal results are displayed)  Labs Reviewed  BASIC METABOLIC PANEL - Abnormal; Notable for the following components:      Result Value   Potassium 5.5 (*)    Glucose, Bld 167 (*)    BUN 43 (*)    Creatinine, Ser 7.51 (*)    Calcium 8.1 (*)    GFR calc non Af Amer 5 (*)    GFR calc Af Amer 6 (*)    All other components within normal limits  CBC - Abnormal; Notable for the following components:   RBC 3.85 (*)    Hemoglobin 11.3 (*)    HCT 35.9 (*)    All other components within normal limits  TROPONIN I - Abnormal; Notable for the following components:   Troponin I 0.04 (*)    All other components within normal limits  BLOOD GAS, VENOUS - Abnormal; Notable for the  following components:   pO2, Ven 48.0 (*)    All other components within normal limits  PROTIME-INR  CG4 I-STAT (LACTIC ACID)   ____________________________________________  EKG  ED ECG REPORT I, Rudene Re, the attending physician, personally viewed and interpreted this  ECG. Sinus tachycardia, rate of 107, normal intervals, normal axis, no ST elevations or depressions.  ____________________________________________  RADIOLOGY  I have personally reviewed the images performed during this visit and I agree with the Radiologist's read.   Interpretation by Radiologist:  Dg Chest Portable 1 View  Result Date: 10/21/2018 CLINICAL DATA:  Patient with shortness of breath EXAM: PORTABLE CHEST 1 VIEW COMPARISON:  Chest radiograph 03/17/2018 FINDINGS: Monitoring leads overlie the patient. Stable cardiac and mediastinal contours. Aortic atherosclerosis. Low lung volumes. New patchy consolidative opacities within the left mid lower lung and right lung base. IMPRESSION: New patchy consolidation within the left mid and lower lung concerning for pneumonia. Followup PA and lateral chest X-ray is recommended in 3-4 weeks following trial of antibiotic therapy to ensure resolution and exclude underlying malignancy. Heterogeneous opacities right lung base may represent atelectasis. Electronically Signed   By: Lovey Newcomer M.D.   On: 10/21/2018 20:10     ____________________________________________   PROCEDURES  Procedure(s) performed: None Procedures Critical Care performed: yes  CRITICAL CARE Performed by: Rudene Re  ?  Total critical care time: 40 min  Critical care time was exclusive of separately billable procedures and treating other patients.  Critical care was necessary to treat or prevent imminent or life-threatening deterioration.  Critical care was time spent personally by me on the following activities: development of treatment plan with patient and/or surrogate as  well as nursing, discussions with consultants, evaluation of patient's response to treatment, examination of patient, obtaining history from patient or surrogate, ordering and performing treatments and interventions, ordering and review of laboratory studies, ordering and review of radiographic studies, pulse oximetry and re-evaluation of patient's condition.  ____________________________________________   INITIAL IMPRESSION / ASSESSMENT AND PLAN / ED COURSE  72 y.o. female with a history of ESRD on HD (T,S), COPD, CHF, diabetes, asthma, anemia who presents for evaluation of sudden onset severe shortness of breath and hypoxia.  Patient arrives extremely hypertensive, severe respiratory distress, volume overloaded with 2+ pitting edema and crackles bilaterally, and elevated JVD.  EKG showing no acute ischemic changes.  Labs showing hyperkalemia with no EKG changes, no acidosis and normal anion gap.  Presentation concerning for flash pulmonary edema.  Patient was started on BiPAP, DuoNeb's, Solu-Medrol, Lasix 80 mg IV, nitro drip.  Chest x-ray concerning for pneumonia.  Patient was given cefepime and vancomycin for possible  HCAP. No signs of sepsis. patient will be admitted to the ICU. Discussed with Dr. Holley Raring need for emergent dialysis this evening and he will arrange that. Hyperkalemia treated with D50, insulin, bicarb, Lasix, and albuterol.      As part of my medical decision making, I reviewed the following data within the [place] notes reviewed and incorporated, Labs reviewed , EKG interpreted , Old EKG reviewed, Old chart reviewed, Radiograph reviewed , Discussed with admitting physician , A consult was requested and obtained from this/these consultant(s) Nephrology, Notes from prior ED visits and [place] Controlled Substance Database    Pertinent labs & imaging results that were available during my care of the patient were reviewed by me and considered in my medical  decision making (see chart for details).    ____________________________________________   FINAL CLINICAL IMPRESSION(S) / ED DIAGNOSES  Final diagnoses:  HCAP (healthcare-associated pneumonia)  Acute respiratory failure with hypoxia ([place])  Flash pulmonary edema ([place])      NEW MEDICATIONS STARTED DURING THIS VISIT:  ED Discharge Orders    None  Note:  This document was prepared using Dragon voice recognition software and may include unintentional dictation errors.    Rudene Re, MD 10/21/18 2045

## 2018-10-21 NOTE — ED Notes (Signed)
Pt brought in via ems from home with sob.  Began while watching tv.  No chest pain.  Pt on cpap on arrival switched to bipap by rt.  Pt alert  Iv's in place and meds given stat.   md at bedside. Sinus on monitor.  Skin warm and dry  Pt had dialysis 2 days ago and has not missed an appointment.

## 2018-10-21 NOTE — ED Notes (Signed)
istat lactic 1.32

## 2018-10-21 NOTE — H&P (Addendum)
[facility] NAME: Phyllis [name]    MR#:  [id number]  DATE OF BIRTH:  [birth year]  DATE OF ADMISSION:  10/21/2018  PRIMARY CARE PHYSICIAN: [name], MD   REQUESTING/REFERRING PHYSICIAN: Alfred Levins, MD  CHIEF COMPLAINT:   Chief Complaint  Patient presents with  . Respiratory Distress    HISTORY OF PRESENT ILLNESS:  Phyllis [name]  is a 72 y.o. female who presents with chief complaint as above.  Patient presents to the ED today with significant respiratory distress.  She states that this was acute in onset today.  However, she does state that over the past week she has gone to the dialysis on 2 separate occasions wheezing.  Tonight when she presented to the ED her blood pressure was significantly elevated, with systolic in the 175Z and diastolic in the 025E per report.  She was hypoxic, and placed on BiPAP.  She required nitroglycerin drip to reduce her blood pressure.  Chest x-ray shows some vascular congestion, but also some left-sided opacity consistent with possible infiltrate.  Hospitalist were called for admission and further treatment  PAST MEDICAL HISTORY:   Past Medical History:  Diagnosis Date  . Allergy   . Anemia of chronic disease   . Anxiety   . Aortic atherosclerosis ([place])   . Asthma   . Chronic back pain   . COPD (chronic obstructive pulmonary disease) ([place])   . Diabetes mellitus with complication ([place])   . ESRD on hemodialysis ([place])    a. Tues/Sat; b. 2/2 small kidneys  . Essential hypertension   . Fistula    lower left arm  . GERD (gastroesophageal reflux disease)   . Gout   . History of echocardiogram    a. TTE 01/2014: nl LV sys fxn, no valvular abnormalities; b. TTE 11/16: nl EF, mild LVH  . History of exercise stress test    a. 01/2014: no evidence of ischemia; b. Lexiscan 08/2015: no sig ischemia, severe GI uptake artifact, low risk; c. CPET @ [facility] 09/2016: exercised 3 min 12 sec on  bike without incline, 2.28 METs, VO2 of 8.1, 48% of predicted, indicating mod to sev functional impairment, evidence of blunted HR, stroke volume, and BP augmentation as well as ventilation-perfusion mismatch with exercise  . HLD (hyperlipidemia)   . 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 Renal insufficiency 09/24/2017   Dialysis patient.  . Sleep apnea      PAST SURGICAL HISTORY:   Past Surgical History:  Procedure Laterality Date  . carpel tunnel    . GALLBLADDER SURGERY    . PERIPHERAL VASCULAR CATHETERIZATION N/A 04/12/2015   Procedure: A/V Shuntogram/Fistulagram;  Surgeon: Algernon Huxley, MD;  Location: [facility];  Service: Cardiovascular;  Laterality: N/A;  . PERIPHERAL VASCULAR CATHETERIZATION N/A 04/12/2015   Procedure: A/V Shunt Intervention;  Surgeon: Algernon Huxley, MD;  Location: [facility];  Service: Cardiovascular;  Laterality: N/A;  . PERIPHERAL VASCULAR CATHETERIZATION N/A 06/09/2015   Procedure: Dialysis/Perma Catheter Removal;  Surgeon: Katha Cabal, MD;  Location: [facility];  Service: Cardiovascular;  Laterality: N/A;     SOCIAL HISTORY:   Social History   Tobacco Use  . Smoking status: Never Smoker  . Smokeless tobacco: Never Used  Substance Use Topics  . Alcohol use: No     FAMILY HISTORY:   Family History  Problem Relation Age of Onset  . Hypertension Mother   .  Hyperlipidemia Mother   . Heart disease Father   . Heart attack Father 24  . Hypertension Father   . Hyperlipidemia Father   . Heart disease Brother        CABG   . Heart attack Brother   . Breast cancer Neg Hx      DRUG ALLERGIES:   Allergies  Allergen Reactions  . Codeine Nausea And Vomiting and Nausea Only  . Enalapril Maleate     Other reaction(s): Headache  . Nitrofurantoin Swelling and Rash    Other Reaction: swelling of body  . Sulfamethoxazole-Trimethoprim Swelling  . Vicodin [Hydrocodone-Acetaminophen] Nausea And  Vomiting and Nausea Only  . 2,4-D Dimethylamine (Amisol) Rash    Other Reaction: h/a  . Baclofen Other (See Comments) and Nausea Only    lightheadness ,drowsiness , muscle weakness , twitching in hands   . Neosporin [Neomycin-Bacitracin Zn-Polymyx] Other (See Comments) and Rash    Other Reaction: irritation Skin irritation   . Quinine Rash    Other Reaction: Vomiting rash, h/a, vision  . Ultram [Tramadol] Palpitations  . Zocor [Simvastatin] Other (See Comments) and Rash    Other Reaction: muscle spasms Muscle pain and spasms  . Bactrim [Sulfamethoxazole-Trimethoprim] Swelling  . Levodopa   . Macrodantin [Nitrofurantoin Macrocrystal] Swelling  . Quinine Derivatives Other (See Comments)    Vertigo,nausea vomiting blurred vision headache ears sensitivity   . Vasotec [Enalapril] Other (See Comments)    Headaches     MEDICATIONS AT HOME:   Prior to Admission medications   Medication Sig Start Date End Date Taking? Authorizing Provider  albuterol (PROVENTIL HFA;VENTOLIN HFA) 108 (90 Base) MCG/ACT inhaler Inhale 2 puffs into the lungs every 6 (six) hours as needed for wheezing or shortness of breath.   Yes [provider]  amLODipine (NORVASC) 10 MG tablet Take 10 mg by mouth.  12/29/13  Yes [provider]  aspirin EC 81 MG EC tablet Take 1 tablet (81 mg total) by mouth daily. 09/20/17  Yes Mody, Ulice Bold, MD  calcium acetate (PHOSLO) 667 MG capsule Take 667 mg by mouth 3 (three) times daily with meals.    Yes [provider]  carvedilol (COREG) 12.5 MG tablet Take 12.5 mg by mouth 2 (two) times daily with a meal.  01/09/14  Yes [provider]  cetirizine (ZYRTEC) 10 MG tablet Take 10 mg by mouth daily.    Yes [provider]  clopidogrel (PLAVIX) 75 MG tablet Take 1 tablet (75 mg total) by mouth daily. 01/18/18  Yes Epifanio Lesches, MD  cyclobenzaprine (FLEXERIL) 10 MG tablet Take 10 mg by mouth daily as needed for muscle spasms.    Yes  [provider]  docusate sodium (COLACE) 100 MG capsule Take 100 mg by mouth 3 (three) times daily as needed for mild constipation.   Yes [provider]  Fluticasone-Salmeterol (ADVAIR) 250-50 MCG/DOSE AEPB Inhale 1 puff into the lungs 2 (two) times daily as needed (for shortness of breath).    Yes [provider]  furosemide (LASIX) 80 MG tablet Take 1 tablet (80 mg total) by mouth daily. 03/17/18  Yes Wieting, Richard, MD  HYDROcodone-acetaminophen (NORCO) 7.5-325 MG tablet Take 1 tablet by mouth 2 (two) times daily as needed for moderate pain.    Yes [provider]  lidocaine-prilocaine (EMLA) cream Apply 1 application topically as needed (prior to treatment).    Yes [provider]  nitroGLYCERIN (NITROSTAT) 0.4 MG SL tablet Place 1 tablet (0.4 mg total) under the  tongue every 5 (five) minutes as needed for chest pain. 09/21/17  Yes Strader, [place] M, PA-C  omeprazole (PRILOSEC) 20 MG capsule Take 20 mg by mouth 2 (two) times daily before a meal.   Yes [provider]  ondansetron (ZOFRAN-ODT) 4 MG disintegrating tablet Take 4 mg by mouth 2 (two) times daily as needed for nausea or vomiting.    Yes [provider]  oxybutynin (DITROPAN XL) 15 MG 24 hr tablet Take 15 mg by mouth daily. 12/24/17  Yes [provider]  pravastatin (PRAVACHOL) 40 MG tablet Take 40 mg by mouth at bedtime.    Yes [provider]  senna (SENOKOT) 8.6 MG tablet Take 1 tablet by mouth daily.   Yes [provider]  topiramate (TOPAMAX) 25 MG tablet Take 25 mg by mouth daily as needed (for headaches).    Yes [provider]    REVIEW OF SYSTEMS:  Review of Systems  Constitutional: Negative for chills, fever, malaise/fatigue and weight loss.  HENT: Negative for ear pain, hearing loss and tinnitus.   Eyes: Negative for blurred vision, double vision, pain and redness.  Respiratory: Positive for shortness of breath and  wheezing. Negative for cough and hemoptysis.   Cardiovascular: Negative for chest pain, palpitations, orthopnea and leg swelling.  Gastrointestinal: Negative for abdominal pain, constipation, diarrhea, nausea and vomiting.  Genitourinary: Negative for dysuria, frequency and hematuria.  Musculoskeletal: Negative for back pain, joint pain and neck pain.  Skin:       No acne, rash, or lesions  Neurological: Negative for dizziness, tremors, focal weakness and weakness.  Endo/Heme/Allergies: Negative for polydipsia. Does not bruise/bleed easily.  Psychiatric/Behavioral: Negative for depression. The patient is not nervous/anxious and does not have insomnia.      VITAL SIGNS:   Vitals:   10/21/18 2037 10/21/18 2100 10/21/18 2115 10/21/18 2121  BP: (!) 151/71   (!) 143/63  Pulse:  99 91   Resp:  (!) 28 19   TempSrc:      SpO2:  100% 100%   Weight:      Height:       Wt Readings from Last 3 Encounters:  10/21/18 60.3 kg  10/16/18 60.7 kg  06/04/18 59.6 kg    PHYSICAL EXAMINATION:  Physical Exam  Vitals reviewed. Constitutional: She is oriented to person, place, and time. She appears well-developed and well-nourished. No distress.  HENT:  Head: Normocephalic and atraumatic.  Mouth/Throat: Oropharynx is clear and moist.  Eyes: Pupils are equal, round, and reactive to light. Conjunctivae and EOM are normal. No scleral icterus.  Neck: Normal range of motion. Neck supple. No JVD present. No thyromegaly present.  Cardiovascular: Normal rate, regular rhythm and intact distal pulses. Exam reveals no gallop and no friction rub.  No murmur heard. Respiratory: She is in respiratory distress (On BiPAP). She has no wheezes. She has no rales.  Left greater than right coarse breath sounds  GI: Soft. Bowel sounds are normal. She exhibits no distension. There is no tenderness.  Musculoskeletal: Normal range of motion. She exhibits no edema.  No arthritis, no gout  Lymphadenopathy:    She has no  cervical adenopathy.  Neurological: She is alert and oriented to person, place, and time. No cranial nerve deficit.  No dysarthria, no aphasia  Skin: Skin is warm and dry. No rash noted. No erythema.  Psychiatric: She has a normal mood and affect. Her behavior is normal. Judgment and thought content normal.    LABORATORY PANEL:  CBC Recent Labs  Lab 10/21/18 1917  WBC 9.6  HGB 11.3*  HCT 35.9*  PLT 166   ------------------------------------------------------------------------------------------------------------------  Chemistries  Recent Labs  Lab 10/21/18 1917  NA 141  K 5.5*  CL 104  CO2 26  GLUCOSE 167*  BUN 43*  CREATININE 7.51*  CALCIUM 8.1*   ------------------------------------------------------------------------------------------------------------------  Cardiac Enzymes Recent Labs  Lab 10/21/18 1917  TROPONINI 0.04*   ------------------------------------------------------------------------------------------------------------------  RADIOLOGY:  Dg Chest Portable 1 View  Result Date: 10/21/2018 CLINICAL DATA:  Patient with shortness of breath EXAM: PORTABLE CHEST 1 VIEW COMPARISON:  Chest radiograph 03/17/2018 FINDINGS: Monitoring leads overlie the patient. Stable cardiac and mediastinal contours. Aortic atherosclerosis. Low lung volumes. New patchy consolidative opacities within the left mid lower lung and right lung base. IMPRESSION: New patchy consolidation within the left mid and lower lung concerning for pneumonia. Followup PA and lateral chest X-ray is recommended in 3-4 weeks following trial of antibiotic therapy to ensure resolution and exclude underlying malignancy. Heterogeneous opacities right lung base may represent atelectasis. Electronically Signed   By: Lovey Newcomer M.D.   On: 10/21/2018 20:10    EKG:   Orders placed or performed during the hospital encounter of 10/21/18  . EKG 12-Lead  . EKG 12-Lead  . ED EKG within 10 minutes  . ED EKG  within 10 minutes    IMPRESSION AND PLAN:  Principal Problem:   Acute respiratory failure with hypoxia (HCC) -patient's oxygen saturations have improved significantly on BiPAP.  She received IV Lasix, see below.  Respiratory failure is due to flash pulmonary edema, potentially with some underlying pneumonia as well.  Admit to stepdown, continue BiPAP.  IV antibiotics administered for question of possible pneumonia.  We will get procalcitonin. Active Problems:   Acute on chronic diastolic CHF (congestive heart failure) (HCC) -IV Lasix given, patient will likely need dialysis session, see below, continue home meds   Accelerated hypertension -IV nitro drip has controlled blood pressure fairly well, continue this for now, continue home meds   Flash pulmonary edema (HCC) -breathing has improved on BiPAP and with blood pressure control, treatment as above and below   End stage renal disease on dialysis [facility]) -nephrology was contacted by ED physician and is aware of the patient, they will follow up determine need for dialysis timing   Type 2 diabetes mellitus with other specified complication ([place]) -sliding scale insulin coverage   Hyperlipidemia -Home dose antilipid  Chart review performed and case discussed with ED provider. Labs, imaging and/or ECG reviewed by provider and discussed with patient/family. Management plans discussed with the patient and/or family.  DVT PROPHYLAXIS: SubQ heparin  GI PROPHYLAXIS:  PPI   ADMISSION STATUS: Inpatient     CODE STATUS: Full Code Status History    Date Active Date Inactive Code Status Order ID Comments User Context   03/16/2018 0806 03/17/2018 1637 Full Code [id number]  Loletha Grayer, MD ED   01/16/2018 1630 01/18/2018 2158 Full Code [id number]  Dustin Flock, MD Inpatient   01/16/2018 1340 01/16/2018 1630 DNR [id number]  Dustin Flock, MD ED   09/18/2017 1348 09/19/2017 1513 DNR [id number]  Loletha Grayer, MD ED   04/12/2015 1056 04/12/2015 1631 Full  Code [id number]  Dew, Erskine Squibb, MD Inpatient      TOTAL CRITICAL CARE TIME TAKING CARE OF THIS PATIENT: 45 minutes.   Phyllis [name] 10/21/2018, 9:22 PM  [facility]  Office  [phone]  CC: Primary care physician; [name], MD  Note:  This  document was prepared using Systems analyst and may include unintentional dictation errors.

## 2018-10-21 NOTE — Progress Notes (Signed)
Transported pt to ICU 15 on Bipap without incident. Pt remains on Bipap and is tol well at this time. Report given to ICU RT.

## 2018-10-21 NOTE — ED Notes (Signed)
Report  Called to renee rn icu nurse

## 2018-10-21 NOTE — ED Notes (Signed)
istat lactic 1.37 Amy, RN and Alfred Levins MD made aware.

## 2018-10-21 NOTE — Consult Note (Signed)
Name: Phyllis [name] MRN: [id number] DOB: 1946/08/06    ADMISSION DATE:  10/21/2018 CONSULTATION DATE: 10/21/2018  REFERRING MD : Dr. Jannifer Franklin   CHIEF COMPLAINT: Shortness of Breath   BRIEF PATIENT DESCRIPTION:  72 yo female admitted with acute on chronic respiratory failure secondary to pulmonary edema and possible pneumonia requiring Bipap and acute on chronic renal failure with hyperkalemia   SIGNIFICANT EVENTS  12/2-Pt admitted to the stepdown unit on Bipap   HISTORY OF PRESENT ILLNESS:   This is a 72 yo female with a PMH of OSA, ESRD on HD (T-Th-Sat), Hyperlipidemia, Gout, GERD, Essential HTN, Type II Diabetes Mellitus, COPD, Chronic Back Pain, Asthma, Aortic Atherosclerosis, Anxiety, Anemia of Chronic Disease, and Allergies.  She presented to [facility] ER via EMS on 12/2 from home with c/o sudden onset of shortness of breath with bilateral lower extremity swelling.  When EMS arrived at pts home her O2 sats were 65% on RA and blood pressure 230/100, therefore she was placed on CPAP.  Per ER notes the pt has been compliant with medications and hemodialysis her most recent session was 11/30.  Upon arrival to the ER pt transitioned to [place].  She remained hypertensive bp 173/79 with audible crackles and JVD present.  EKG revealed no acute ischemic changes.  She received duonebs, solumedrol, 80 mg iv lasix, and nitroglycerin gtt initiated.  CXR concerning for left sided pneumonia she received cefepime and vancomycin.  Lab results revealed K+ 5.5, glucose 167, BUN 43, creatinine 7.51, troponin 0.04, lactic acid 1.37, and hgb 11.3.  Therefore, she received D50W, insulin, sodium bicarb, and albuterol to treat hyperkalemia.  ER physician contacted Nephrologist Dr. Holley Raring pt to undergo emergent hemodialysis.  She was subsequently admitted to the stepdown unit for additional workup and treatment.    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  has a past surgical history that includes Gallbladder surgery; carpel tunnel; Cardiac catheterization (N/A, 04/12/2015); Cardiac catheterization (N/A, 04/12/2015); and Cardiac catheterization (N/A, 06/09/2015). Prior to Admission medications   Medication Sig Start Date End Date Taking? Authorizing Provider  albuterol (PROVENTIL HFA;VENTOLIN HFA) 108 (90 Base) MCG/ACT inhaler Inhale 2 puffs into the lungs every 6 (six) hours as needed for wheezing or shortness of breath.   Yes [provider]  amLODipine (NORVASC) 10 MG tablet Take 10 mg by mouth.  12/29/13  Yes [provider]  aspirin EC 81 MG EC tablet Take 1 tablet (81 mg total) by mouth daily. 09/20/17  Yes Mody, Ulice Bold, MD  calcium acetate (PHOSLO) 667 MG capsule Take 667 mg by mouth 3 (three) times daily with meals.    Yes [provider]  carvedilol (COREG) 12.5 MG tablet Take 12.5 mg by mouth 2 (two) times daily with a meal.  01/09/14  Yes [provider]  cetirizine (ZYRTEC) 10 MG tablet Take 10 mg by mouth daily.    Yes [provider]  clopidogrel (PLAVIX) 75 MG tablet Take 1 tablet (75 mg total) by mouth daily. 01/18/18  Yes Epifanio Lesches, MD  cyclobenzaprine (FLEXERIL) 10 MG tablet Take 10 mg by mouth daily as needed for muscle spasms.  Yes [provider]  docusate sodium (COLACE) 100 MG capsule Take 100 mg by mouth 3 (three) times daily as needed for mild constipation.   Yes [provider]  Fluticasone-Salmeterol (ADVAIR) 250-50 MCG/DOSE AEPB  Inhale 1 puff into the lungs 2 (two) times daily as needed (for shortness of breath).    Yes [provider]  furosemide (LASIX) 80 MG tablet Take 1 tablet (80 mg total) by mouth daily. 03/17/18  Yes Wieting, Richard, MD  HYDROcodone-acetaminophen (NORCO) 7.5-325 MG tablet Take 1 tablet by mouth 2 (two) times daily as needed for moderate pain.    Yes [provider]  lidocaine-prilocaine (EMLA) cream Apply 1 application topically as needed (prior to treatment).    Yes [provider]  nitroGLYCERIN (NITROSTAT) 0.4 MG SL tablet Place 1 tablet (0.4 mg total) under the tongue every 5 (five) minutes as needed for chest pain. 09/21/17  Yes Strader, [place] M, PA-C  omeprazole (PRILOSEC) 20 MG capsule Take 20 mg by mouth 2 (two) times daily before a meal.   Yes [provider]  ondansetron (ZOFRAN-ODT) 4 MG disintegrating tablet Take 4 mg by mouth 2 (two) times daily as needed for nausea or vomiting.    Yes [provider]  oxybutynin (DITROPAN XL) 15 MG 24 hr tablet Take 15 mg by mouth daily. 12/24/17  Yes [provider]  pravastatin (PRAVACHOL) 40 MG tablet Take 40 mg by mouth at bedtime.    Yes [provider]  senna (SENOKOT) 8.6 MG tablet Take 1 tablet by mouth daily.   Yes [provider]  topiramate (TOPAMAX) 25 MG tablet Take 25 mg by mouth daily as needed (for headaches).    Yes [provider]   Allergies  Allergen Reactions  . Codeine Nausea And Vomiting and Nausea Only  . Enalapril Maleate     Other reaction(s): Headache  . Nitrofurantoin Swelling and Rash    Other Reaction: swelling of body  . Sulfamethoxazole-Trimethoprim Swelling  . Vicodin [Hydrocodone-Acetaminophen] Nausea And Vomiting and Nausea Only  . 2,4-D Dimethylamine (Amisol) Rash    Other Reaction: h/a  . Baclofen Other (See Comments) and Nausea Only    lightheadness ,drowsiness , muscle weakness , twitching in hands   . Neosporin  [Neomycin-Bacitracin Zn-Polymyx] Other (See Comments) and Rash    Other Reaction: irritation Skin irritation   . Quinine Rash    Other Reaction: Vomiting rash, h/a, vision  . Ultram [Tramadol] Palpitations  . Zocor [Simvastatin] Other (See Comments) and Rash    Other Reaction: muscle spasms Muscle pain and spasms  . Bactrim [Sulfamethoxazole-Trimethoprim] Swelling  . Levodopa   . Macrodantin [Nitrofurantoin Macrocrystal] Swelling  . Quinine Derivatives Other (See Comments)    Vertigo,nausea vomiting blurred vision headache ears sensitivity   . Vasotec [Enalapril] Other (See Comments)    Headaches     FAMILY HISTORY:  family history includes Heart attack in her brother; Heart attack (age of onset: 26) in her father; Heart disease in her brother and father; Hyperlipidemia in her father and mother; Hypertension in her father and mother. SOCIAL HISTORY:  reports that she has never smoked. She has never used smokeless tobacco. She reports that she does not drink alcohol or use drugs.  REVIEW OF SYSTEMS: Positives in BOLD  Constitutional: Negative for fever, chills, weight loss, malaise/fatigue and diaphoresis.  HENT: Negative for hearing loss, ear pain, nosebleeds, congestion, sore throat, neck pain, tinnitus and ear discharge.   Eyes: Negative for blurred vision, double vision,  photophobia, pain, discharge and redness.  Respiratory: cough, hemoptysis, sputum production, shortness of breath, wheezing and stridor.   Cardiovascular: chest pain, palpitations, orthopnea, claudication, leg swelling and PND.  Gastrointestinal: Negative for heartburn, nausea, vomiting, abdominal pain, diarrhea, constipation, blood in stool and melena.  Genitourinary: Negative for dysuria, urgency, frequency, hematuria and flank pain.  Musculoskeletal: Negative for myalgias, back pain, joint pain and falls.  Skin: Negative for itching and rash.  Neurological: dizziness, tingling, tremors, sensory change,  speech change, focal weakness, seizures, loss of consciousness, weakness and headaches.  Endo/Heme/Allergies: Negative for environmental allergies and polydipsia. Does not bruise/bleed easily.  SUBJECTIVE:  Pt c/o headache   VITAL SIGNS: Pulse Rate:  [91-107] 91 (12/02 2115) Resp:  [15-32] 19 (12/02 2115) BP: (143-173)/(63-79) 143/63 (12/02 2121) SpO2:  [100 %] 100 % (12/02 2115) Weight:  [60.3 kg] 60.3 kg (12/02 1912)  PHYSICAL EXAMINATION: General: well developed, well nourished, NAD  Neuro: alert and oriented, follows commands HEENT: supple, no JVD Cardiovascular: nsr, rrr, no R/G  Lungs: faint crackles throughout, even, non labored  Abdomen: +BS x4, obese, soft, non tender, non distended  Musculoskeletal: 1+ bilateral extremity edema  Skin: intact no rashes or lesions, right forearm fistula positive bruit/thrill    Recent Labs  Lab 10/21/18 1917  NA 141  K 5.5*  CL 104  CO2 26  BUN 43*  CREATININE 7.51*  GLUCOSE 167*   Recent Labs  Lab 10/21/18 1917  HGB 11.3*  HCT 35.9*  WBC 9.6  PLT 166   Dg Chest Portable 1 View  Result Date: 10/21/2018 CLINICAL DATA:  Patient with shortness of breath EXAM: PORTABLE CHEST 1 VIEW COMPARISON:  Chest radiograph 03/17/2018 FINDINGS: Monitoring leads overlie the patient. Stable cardiac and mediastinal contours. Aortic atherosclerosis. Low lung volumes. New patchy consolidative opacities within the left mid lower lung and right lung base. IMPRESSION: New patchy consolidation within the left mid and lower lung concerning for pneumonia. Followup PA and lateral chest X-ray is recommended in 3-4 weeks following trial of antibiotic therapy to ensure resolution and exclude underlying malignancy. Heterogeneous opacities right lung base may represent atelectasis. Electronically Signed   By: Lovey Newcomer M.D.   On: 10/21/2018 20:10    ASSESSMENT / PLAN: Acute on chronic respiratory failure secondary to pulmonary edema and possible  pneumonia Hx: COPD, Asthma, and OSA Supplemental O2 for dyspnea and/or hypoxia  Prn bronchodilator therapy  Continue dulera Trend WBC and monitor fever curve  PCT pending if elevated will start empiric abx    Mildly elevated troponin likely demand ischemia in setting of respiratory failure HTN  Hx: Hyperlipidemia and Aortic Atherosclerosis  Continuous telemetry monitoring  Trend troponin's  Nitroglycerin gtt to maintain sbp 160 or less  Continue outpatient pravastatin, amlodipine, aspirin, carvedilol, plavix, and lasix   ESRD on Hemodialysis  Hyperkalemia  Nephrology consulted appreciate input-emergent HD pending  Trend BMP  Replace electrolytes as indicated  Monitor UOP  Avoid nephrotoxic medications   Anemia of Chronic Disease  VTE px: subq heparin Trend CBC  Monitor for s/sx of bleeding and transfuse for hgb <7  GERD  Continue protonix   Type II Diabetes Mellitus  CBG's ac/hs  SSI   Marda Stalker, [place] Pager [phone] (please enter 7 digits) PCCM Consult Pager [phone] (please enter 7 digits)

## 2018-10-21 NOTE — ED Triage Notes (Signed)
Pt brought in via ems from home with sob.  Pt on cpap on arrival  Iv's in place  md at bedside.  Pt alert.

## 2018-10-22 ENCOUNTER — Other Ambulatory Visit: Payer: Self-pay

## 2018-10-22 ENCOUNTER — Inpatient Hospital Stay: Payer: Medicare Other

## 2018-10-22 LAB — TROPONIN I
Troponin I: 0.1 ng/mL (ref <0.03)
Troponin I: 0.14 ng/mL (ref <0.03)

## 2018-10-22 LAB — MRSA PCR SCREENING: MRSA by PCR: NEGATIVE

## 2018-10-22 LAB — RENAL FUNCTION PANEL
Albumin: 3.9 g/dL (ref 3.5–5.0)
Anion gap: 14 (ref 5–15)
BUN: 44 mg/dL — ABNORMAL HIGH (ref 8–23)
CO2: 25 mmol/L (ref 22–32)
Calcium: 8 mg/dL — ABNORMAL LOW (ref 8.9–10.3)
Chloride: 101 mmol/L (ref 98–111)
Creatinine, Ser: 7.55 mg/dL — ABNORMAL HIGH (ref 0.44–1.00)
GFR calc Af Amer: 6 mL/min — ABNORMAL LOW (ref >60)
GFR calc non Af Amer: 5 mL/min — ABNORMAL LOW (ref >60)
Glucose, Bld: 167 mg/dL — ABNORMAL HIGH (ref 70–99)
Phosphorus: 3.7 mg/dL (ref 2.5–4.6)
Potassium: 5 mmol/L (ref 3.5–5.1)
Sodium: 140 mmol/L (ref 135–145)

## 2018-10-22 LAB — CBC
HCT: 29.8 % — ABNORMAL LOW (ref 36.0–46.0)
Hemoglobin: 9.5 g/dL — ABNORMAL LOW (ref 12.0–15.0)
MCH: 29.4 pg (ref 26.0–34.0)
MCHC: 31.9 g/dL (ref 30.0–36.0)
MCV: 92.3 fL (ref 80.0–100.0)
Platelets: 143 10*3/uL — ABNORMAL LOW (ref 150–400)
RBC: 3.23 MIL/uL — ABNORMAL LOW (ref 3.87–5.11)
RDW: 14.1 % (ref 11.5–15.5)
WBC: 8.3 10*3/uL (ref 4.0–10.5)
nRBC: 0 % (ref 0.0–0.2)

## 2018-10-22 LAB — GLUCOSE, CAPILLARY
Glucose-Capillary: 128 mg/dL — ABNORMAL HIGH (ref 70–99)
Glucose-Capillary: 137 mg/dL — ABNORMAL HIGH (ref 70–99)
Glucose-Capillary: 152 mg/dL — ABNORMAL HIGH (ref 70–99)
Glucose-Capillary: 165 mg/dL — ABNORMAL HIGH (ref 70–99)
Glucose-Capillary: 93 mg/dL (ref 70–99)

## 2018-10-22 LAB — BRAIN NATRIURETIC PEPTIDE: B Natriuretic Peptide: 1546 pg/mL — ABNORMAL HIGH (ref 0.0–100.0)

## 2018-10-22 LAB — PROCALCITONIN: Procalcitonin: 0.53 ng/mL

## 2018-10-22 LAB — PHOSPHORUS: Phosphorus: 3.8 mg/dL (ref 2.5–4.6)

## 2018-10-22 MED ORDER — OXYBUTYNIN CHLORIDE ER 5 MG PO TB24
15.0000 mg | ORAL_TABLET | Freq: Every day | ORAL | Status: DC
  Administered 2018-10-22: 15 mg via ORAL
  Filled 2018-10-22 (×2): qty 3

## 2018-10-22 MED ORDER — CARVEDILOL 25 MG PO TABS
25.0000 mg | ORAL_TABLET | Freq: Two times a day (BID) | ORAL | Status: DC
  Administered 2018-10-22 – 2018-10-23 (×2): 25 mg via ORAL
  Filled 2018-10-22 (×3): qty 1

## 2018-10-22 MED ORDER — EPOETIN ALFA 10000 UNIT/ML IJ SOLN
4000.0000 [IU] | INTRAMUSCULAR | Status: DC
  Administered 2018-10-22: 4000 [IU] via INTRAVENOUS
  Filled 2018-10-22: qty 1

## 2018-10-22 NOTE — Progress Notes (Addendum)
Pre HD Assessment    10/22/18 1200  Neurological  Level of Consciousness Alert  Orientation Level Oriented X4  Respiratory  Respiratory Pattern Regular;Unlabored;Symmetrical  Chest Assessment Chest expansion symmetrical  Bilateral Breath Sounds Diminished  Cardiac  Pulse Regular  Heart Sounds S1, S2  Jugular Venous Distention (JVD) No  ECG Monitor Yes  Cardiac Rhythm NSR  Vascular  R Radial Pulse +2  L Radial Pulse +2  R Dorsalis Pedis Pulse +1  L Dorsalis Pedis Pulse +1  Edema Generalized  Generalized Edema +1  RUE Edema +1  LUE Edema +1  RLE Edema Non-pitting  LLE Edema Non-Pitting  Integumentary  Integumentary (WDL) X  Skin Color Appropriate for ethnicity  Skin Condition Dry  Skin Integrity Ecchymosis  Ecchymosis Location Abdomen;Leg  Ecchymosis Location Orientation Right;Left  Musculoskeletal  Musculoskeletal (WDL) X  Generalized Weakness Yes  GU Assessment  Genitourinary (WDL) X (HD pt, still makes urine)  External Urinary Catheter  Placement Date/Time: 10/21/18 2308   External Urinary Catheter Type: Female  Collection Container Dedicated Suction Canister  Securement Method Other (Comment)  Psychosocial  Psychosocial (WDL) WDL

## 2018-10-22 NOTE — Progress Notes (Signed)
Post HD assessment    10/22/18 0449  Neurological  Level of Consciousness Alert  Orientation Level Oriented X4  Respiratory  Respiratory Pattern Regular;Unlabored  Chest Assessment Chest expansion symmetrical  Cardiac  ECG Monitor Yes  Cardiac Rhythm NSR  Ectopy Unifocal PVC's  Ectopy Frequency Frequent  Vascular  R Radial Pulse +2  L Radial Pulse +2  Edema Generalized  Integumentary  Integumentary (WDL) X  Skin Color Appropriate for ethnicity  Musculoskeletal  Musculoskeletal (WDL) X  Generalized Weakness Yes  Assistive Device None  GU Assessment  Genitourinary (WDL) X  Genitourinary Symptoms  (HD)  Psychosocial  Psychosocial (WDL) WDL

## 2018-10-22 NOTE — Progress Notes (Signed)
Patient's system clotted. Notified MD, he states to reset patient to finish treatment. Patient has 1 hour of treatment left.

## 2018-10-22 NOTE — Progress Notes (Signed)
Pre HD assessment    10/22/18 0110  Neurological  Level of Consciousness Alert  Orientation Level Oriented X4  Respiratory  Respiratory Pattern Regular;Unlabored  Chest Assessment Chest expansion symmetrical  Cardiac  ECG Monitor Yes  Cardiac Rhythm NSR  Vascular  R Radial Pulse +2  L Radial Pulse +2  Edema Generalized  Integumentary  Integumentary (WDL) X  Skin Color Appropriate for ethnicity  Musculoskeletal  Musculoskeletal (WDL) X  Generalized Weakness Yes  Assistive Device None  GU Assessment  Genitourinary (WDL) X  Genitourinary Symptoms  (HD)  Psychosocial  Psychosocial (WDL) WDL

## 2018-10-22 NOTE — Progress Notes (Signed)
[facility]  ROUNDING NOTE   Subjective:  Patient well-known to [place] as we follow her for outpatient hemodialysis. She normally dialyzes on Tuesday and Saturday. Came in with significantly high blood pressure as well as pulmonary edema. Underwent urgent dialysis last night and tolerated well. Breathing comfortably at the moment.   Objective:  Vital signs in last 24 hours:  Temp:  [97.9 F (36.6 C)-98.5 F (36.9 C)] 97.9 F (36.6 C) (12/03 0450) Pulse Rate:  [81-107] 93 (12/03 0700) Resp:  [11-32] 13 (12/03 0700) BP: (115-191)/(62-98) 180/80 (12/03 0700) SpO2:  [98 %-100 %] 99 % (12/03 0600) Weight:  [60.3 kg-64.2 kg] 64.2 kg (12/03 0115)  Weight change:  Filed Weights   10/21/18 1912 10/21/18 2254 10/22/18 0115  Weight: 60.3 kg 60.5 kg 64.2 kg    Intake/Output: I/O last 3 completed shifts: In: 37.5 [I.V.:37.5] Out: 2013 [Other:2013]   Intake/Output this shift:  No intake/output data recorded.  Physical Exam: General: No acute distress  Head: Normocephalic, atraumatic. Moist oral mucosal membranes  Eyes: Anicteric  Neck: Supple, trachea midline  Lungs:  Basilar rales, normal effort  Heart: S1S2 no rubs  Abdomen:  Soft, nontender, bowel sounds present  Extremities: trace peripheral edema.  Neurologic: Awake, alert, following commands  Skin: No lesions  Access: LUE AVF    Basic Metabolic Panel: Recent Labs  Lab 10/21/18 1917 10/22/18 0157  NA 141 140  K 5.5* 5.0  CL 104 101  CO2 26 25  GLUCOSE 167* 167*  BUN 43* 44*  CREATININE 7.51* 7.55*  CALCIUM 8.1* 8.0*  PHOS  --  3.8  3.7    Liver Function Tests: Recent Labs  Lab 10/22/18 0157  ALBUMIN 3.9   No results for input(s): LIPASE, AMYLASE in the last 168 hours. No results for input(s): AMMONIA in the last 168 hours.  CBC: Recent Labs  Lab 10/21/18 1917 10/22/18 0157  WBC 9.6 8.3  HGB 11.3* 9.5*  HCT 35.9* 29.8*  MCV 93.2 92.3  PLT 166 143*    Cardiac Enzymes: Recent  Labs  Lab 10/21/18 1917 10/22/18 0157 10/22/18 0708  TROPONINI 0.04* 0.10* 0.14*    BNP: Invalid input(s): POCBNP  CBG: Recent Labs  Lab 10/21/18 2239 10/22/18 0041 10/22/18 0339 10/22/18 0754  GLUCAP 127* 152* 128* 89*    Microbiology: Results for orders placed or performed during the hospital encounter of 10/21/18  MRSA PCR Screening     Status: None   Collection Time: 10/21/18 10:46 PM  Result Value Ref Range Status   MRSA by PCR NEGATIVE NEGATIVE Final    Comment:        The GeneXpert MRSA Assay (FDA approved for NASAL specimens only), is one component of a comprehensive MRSA colonization surveillance program. It is not intended to diagnose MRSA infection nor to guide or monitor treatment for MRSA infections. Performed at [facility], [address] [place], [place] [address]     Coagulation Studies: Recent Labs    10/21/18 1917  LABPROT 13.2  INR 1.01    Urinalysis: No results for input(s): COLORURINE, LABSPEC, PHURINE, GLUCOSEU, HGBUR, BILIRUBINUR, KETONESUR, PROTEINUR, UROBILINOGEN, NITRITE, LEUKOCYTESUR in the last 72 hours.  Invalid input(s): APPERANCEUR    Imaging: Dg Chest Port 1 View  Result Date: 10/22/2018 CLINICAL DATA:  Acute respiratory failure EXAM: PORTABLE CHEST 1 VIEW COMPARISON:  10/21/2018 FINDINGS: Cardiac shadow is stable. Aortic calcifications are again noted. The overall inspiratory effort is poor although there has been clearing of the infiltrate in  the left base when compared with the previous day. No sizable effusion is noted. No bony abnormality is seen. IMPRESSION: Interval clearing of left basilar infiltrate. Electronically Signed   By: Inez Catalina M.D.   On: 10/22/2018 07:12   Dg Chest Portable 1 View  Result Date: 10/21/2018 CLINICAL DATA:  Patient with shortness of breath EXAM: PORTABLE CHEST 1 VIEW COMPARISON:  Chest radiograph 03/17/2018 FINDINGS: Monitoring leads overlie the patient. Stable cardiac and  mediastinal contours. Aortic atherosclerosis. Low lung volumes. New patchy consolidative opacities within the left mid lower lung and right lung base. IMPRESSION: New patchy consolidation within the left mid and lower lung concerning for pneumonia. Followup PA and lateral chest X-ray is recommended in 3-4 weeks following trial of antibiotic therapy to ensure resolution and exclude underlying malignancy. Heterogeneous opacities right lung base may represent atelectasis. Electronically Signed   By: Lovey Newcomer M.D.   On: 10/21/2018 20:10     Medications:   . sodium chloride    . sodium chloride     . amLODipine  10 mg Oral Daily  . aspirin EC  81 mg Oral Daily  . carvedilol  12.5 mg Oral BID WC  . Chlorhexidine Gluconate Cloth  6 each Topical Q0600  . clopidogrel  75 mg Oral Daily  . furosemide  80 mg Oral Daily  . heparin  5,000 Units Subcutaneous Q8H  . insulin aspart  0-5 Units Subcutaneous QHS  . insulin aspart  0-9 Units Subcutaneous TID WC  . mometasone-formoterol  2 puff Inhalation BID  . pantoprazole sodium  20 mg Oral BID  . pravastatin  40 mg Oral QHS   sodium chloride, sodium chloride, acetaminophen **OR** acetaminophen, alteplase, heparin, HYDROcodone-acetaminophen, ipratropium-albuterol, lidocaine (PF), lidocaine-prilocaine, ondansetron **OR** ondansetron (ZOFRAN) IV, pentafluoroprop-tetrafluoroeth, topiramate  Assessment/ Plan:  72 y.o. female with end stage renal disease on hemodialysis, hypertension, hyperlipidemia, gout, GERD, diabetes mellitus type II, COPD  TS (twice a week) [place].  1.  End-stage renal disease on hemodialysis Tuesday/Saturday.  Patient on dialysis twice a week.  Suspect volume overload as a cause for her shortness of breath.  Patient underwent hemodialysis urgently yesterday and we plan for additional dialysis treatment today as well.  2.  Shortness of breath/Pulmonary edema.  Chest x-ray apparently demonstrated focal consolidation.   Blood pressure was quite high upon admission therefore pulmonary edema could also be a concern.  Patient significantly improved with dialysis.  She was given a dose of antibiotics yesterday.  However this focal infiltrate has cleared up on x-ray today therefore it appears that this was most likely unilateral pulmonary edema.  3.  Anemia of chronic kidney disease.  Hemoglobin currently 9.5.  Administer Epogen 4000 units IV with dialysis today.  4.  Secondary hyperparathyroidism.  Check phosphorus with dialysis treatment today.   LOS: 1 Rudie Sermons 12/3/20198:48 AM

## 2018-10-22 NOTE — Progress Notes (Signed)
Pre HD assessment    10/22/18 0115  Vital Signs  Temp 98.5 F (36.9 C)  Temp Source Oral  Pulse Rate 90  Pulse Rate Source Monitor  Resp (!) 23  BP (!) 159/68  BP Location Right Arm  BP Method Automatic  Patient Position (if appropriate) Lying  Oxygen Therapy  SpO2 99 %  O2 Device Nasal Cannula  O2 Flow Rate (L/min) 2 L/min  Pain Assessment  Pain Scale 0-10  Pain Score 0  Dialysis Weight  Weight 64.2 kg  Type of Weight Pre-Dialysis  Time-Out for Hemodialysis  What Procedure? HD  Pt Identifiers(min of two) First/Last Name;MRN/Account#  Correct Site? Yes  Correct Side? Yes  Correct Procedure? Yes  Consents Verified? Yes  Rad Studies Available? N/A  Safety Precautions Reviewed? Yes  Engineer, civil (consulting) Number  (4A)  Station Number  (bedside ICU 15)  UF/Alarm Test Passed  Conductivity: Meter 13.8  Conductivity: Machine  13.9  pH 7.6  Reverse Osmosis WRO # 1  Normal Saline Lot Number [id number]  Dialyzer Lot Number [id number]  Disposable Set Lot Number [id number]  Machine Temperature 98.6 F (37 C)  Musician and Audible Yes  Blood Lines Intact and Secured Yes  Pre Treatment Patient Checks  Vascular access used during treatment Fistula  Hepatitis B Surface Antigen Results Negative  Date Hepatitis B Surface Antigen Drawn 10/01/18  Hepatitis B Surface Antibody  (<10)  Date Hepatitis B Surface Antibody Drawn 09/03/18  Hemodialysis Consent Verified Yes  Hemodialysis Standing Orders Initiated Yes  ECG (Telemetry) Monitor On Yes  Prime Ordered Normal Saline  Length of  DialysisTreatment -hour(s) 3 Hour(s)  Dialyzer Elisio 17H NR  Dialysate 2K, 2.5 Ca  Dialysis Anticoagulant None  Dialysate Flow Ordered 800  Blood Flow Rate Ordered 300 mL/min  Ultrafiltration Goal 2 Liters  Pre Treatment Labs Renal panel;Phosphorus;Other (Comment);CBC (BNP, Procalcitonin, Troponin, BMP)  Dialysis Blood Pressure Support Ordered Normal Saline  Education / Care Plan   Dialysis Education Provided Yes  Documented Education in Care Plan Yes

## 2018-10-22 NOTE — Care Management (Signed)
Phyllis [name] with [facility] notified of admission

## 2018-10-22 NOTE — Progress Notes (Signed)
CRITICAL VALUE ALERT  Critical Value: 0.10 Troponin  Date & Time Notied:  10/22/2018 02:42  Provider Notified: Marda Stalker, NP  Orders Received/Actions taken: no new orders at this time, pt is currently receiving dialysis

## 2018-10-22 NOTE — Progress Notes (Signed)
HD tx end    10/22/18 0445  Vital Signs  Pulse Rate 84  Pulse Rate Source Monitor  Resp 19  BP (!) 175/68  BP Location Right Arm  BP Method Automatic  Patient Position (if appropriate) Lying  Oxygen Therapy  SpO2 100 %  O2 Device Nasal Cannula  O2 Flow Rate (L/min) 2 L/min  During Hemodialysis Assessment  Dialysis Fluid Bolus Normal Saline  Bolus Amount (mL) 250 mL  Intra-Hemodialysis Comments Tx completed

## 2018-10-22 NOTE — Progress Notes (Signed)
Pt tolerated treatment well. She was unable to meet goal after being reset up due to dialyzer clotting. Patient's net UF was 1405 and her BVP was 61.3. MD aware.     10/22/18 1645  Vital Signs  Temp 98.5 F (36.9 C)  Temp Source Oral  Pulse Rate 92  Pulse Rate Source Monitor  Resp 14  BP (!) 171/74  BP Location Right Arm  BP Method Automatic  Patient Position (if appropriate) Lying  Oxygen Therapy  SpO2 100 %  O2 Device Nasal Cannula  O2 Flow Rate (L/min) 2 L/min  Pain Assessment  Pain Scale 0-10  Pain Score 0  Dialysis Weight  Weight  (Unable to weigh)  Post-Hemodialysis Assessment  Rinseback Volume (mL) 250 mL  Dialyzer Clearance Lightly streaked  Duration of HD Treatment -hour(s) 3.5 hour(s)  Hemodialysis Intake (mL) 600 mL  UF Total -Machine (mL) 2005 mL  Net UF (mL) 1405 mL  Tolerated HD Treatment Yes  AVG/AVF Arterial Site Held (minutes) 10 minutes  AVG/AVF Venous Site Held (minutes) 10 minutes

## 2018-10-22 NOTE — Progress Notes (Signed)
HD Treatment Complete    10/22/18 1632  Vital Signs  Pulse Rate 93  Pulse Rate Source Monitor  Resp 17  Oxygen Therapy  SpO2 100 %  O2 Device Nasal Cannula  O2 Flow Rate (L/min) 2 L/min  During Hemodialysis Assessment  Blood Flow Rate (mL/min) 300 mL/min  Arterial Pressure (mmHg) -170 mmHg  Venous Pressure (mmHg) 150 mmHg  Transmembrane Pressure (mmHg) 60 mmHg  Ultrafiltration Rate (mL/min) 590 mL/min  Dialysate Flow Rate (mL/min) 600 ml/min  Conductivity: Machine  13.5  HD Safety Checks Performed Yes  Intra-Hemodialysis Comments Tolerated well;Tx completed (UF 2005)

## 2018-10-22 NOTE — Progress Notes (Signed)
[facility] NAME: Phyllis [name]    MR#:  [id number]  DATE OF BIRTH:  1946-06-26  SUBJECTIVE:  CHIEF COMPLAINT:   Chief Complaint  Patient presents with  . Respiratory Distress  Patient seen and evaluated today Decreased shortness of breath On oxygen via nasal cannula Getting hemodialysis  REVIEW OF SYSTEMS:    ROS  CONSTITUTIONAL: No documented fever. No fatigue, weakness. No weight gain, no weight loss.  EYES: No blurry or double vision.  ENT: No tinnitus. No postnasal drip. No redness of the oropharynx.  RESPIRATORY: No cough, no wheeze, no hemoptysis.  Decreased dyspnea.  CARDIOVASCULAR: No chest pain. No orthopnea. No palpitations. No syncope.  GASTROINTESTINAL: No nausea, no vomiting or diarrhea. No abdominal pain. No melena or hematochezia.  GENITOURINARY: No dysuria or hematuria.  ENDOCRINE: No polyuria or nocturia. No heat or cold intolerance.  HEMATOLOGY: No anemia. No bruising. No bleeding.  INTEGUMENTARY: No rashes. No lesions.  MUSCULOSKELETAL: No arthritis. No swelling. No gout.  NEUROLOGIC: No numbness, tingling, or ataxia. No seizure-type activity.  PSYCHIATRIC: No anxiety. No insomnia. No ADD.   DRUG ALLERGIES:   Allergies  Allergen Reactions  . Codeine Nausea And Vomiting and Nausea Only  . Enalapril Maleate     Other reaction(s): Headache  . Nitrofurantoin Swelling and Rash    Other Reaction: swelling of body  . Sulfamethoxazole-Trimethoprim Swelling  . Vicodin [Hydrocodone-Acetaminophen] Nausea And Vomiting and Nausea Only  . 2,4-D Dimethylamine (Amisol) Rash    Other Reaction: h/a  . Baclofen Other (See Comments) and Nausea Only    lightheadness ,drowsiness , muscle weakness , twitching in hands   . Neosporin [Neomycin-Bacitracin Zn-Polymyx] Other (See Comments) and Rash    Other Reaction: irritation Skin irritation   . Quinine Rash    Other Reaction: Vomiting rash, h/a, vision  . Ultram  [Tramadol] Palpitations  . Zocor [Simvastatin] Other (See Comments) and Rash    Other Reaction: muscle spasms Muscle pain and spasms  . Bactrim [Sulfamethoxazole-Trimethoprim] Swelling  . Levodopa   . Macrodantin [Nitrofurantoin Macrocrystal] Swelling  . Quinine Derivatives Other (See Comments)    Vertigo,nausea vomiting blurred vision headache ears sensitivity   . Vasotec [Enalapril] Other (See Comments)    Headaches     VITALS:  Blood pressure (!) 164/62, pulse 81, temperature 98.9 F (37.2 C), temperature source Oral, resp. rate (!) 35, height 4\' 10"  (1.473 m), weight 64.2 kg, SpO2 100 %.  PHYSICAL EXAMINATION:   Physical Exam  GENERAL:  72 y.o.-year-old patient lying in the bed with no acute distress.  EYES: Pupils equal, round, reactive to light and accommodation. No scleral icterus. Extraocular muscles intact.  HEENT: Head atraumatic, normocephalic. Oropharynx and nasopharynx clear.  NECK:  Supple, no jugular venous distention. No thyroid enlargement, no tenderness.  LUNGS: Improved breath sounds bilaterally, no wheezing, rales, rhonchi. No use of accessory muscles of respiration.  CARDIOVASCULAR: S1, S2 normal. No murmurs, rubs, or gallops.  ABDOMEN: Soft, nontender, nondistended. Bowel sounds present. No organomegaly or mass.  EXTREMITIES: No cyanosis, clubbing or edema b/l.    NEUROLOGIC: Cranial nerves II through XII are intact. No focal Motor or sensory deficits b/l.   PSYCHIATRIC: The patient is alert and oriented x 3.  SKIN: No obvious rash, lesion, or ulcer.   LABORATORY PANEL:   CBC Recent Labs  Lab 10/22/18 0157  WBC 8.3  HGB 9.5*  HCT 29.8*  PLT 143*   ------------------------------------------------------------------------------------------------------------------ Chemistries  Recent Labs  Lab 10/22/18 0157  NA 140  K 5.0  CL 101  CO2 25  GLUCOSE 167*  BUN 44*  CREATININE 7.55*  CALCIUM 8.0*    ------------------------------------------------------------------------------------------------------------------  Cardiac Enzymes Recent Labs  Lab 10/22/18 0708  TROPONINI 0.14*   ------------------------------------------------------------------------------------------------------------------  RADIOLOGY:  Dg Chest Port 1 View  Result Date: 10/22/2018 CLINICAL DATA:  Acute respiratory failure EXAM: PORTABLE CHEST 1 VIEW COMPARISON:  10/21/2018 FINDINGS: Cardiac shadow is stable. Aortic calcifications are again noted. The overall inspiratory effort is poor although there has been clearing of the infiltrate in the left base when compared with the previous day. No sizable effusion is noted. No bony abnormality is seen. IMPRESSION: Interval clearing of left basilar infiltrate. Electronically Signed   By: Inez Catalina M.D.   On: 10/22/2018 07:12   Dg Chest Portable 1 View  Result Date: 10/21/2018 CLINICAL DATA:  Patient with shortness of breath EXAM: PORTABLE CHEST 1 VIEW COMPARISON:  Chest radiograph 03/17/2018 FINDINGS: Monitoring leads overlie the patient. Stable cardiac and mediastinal contours. Aortic atherosclerosis. Low lung volumes. New patchy consolidative opacities within the left mid lower lung and right lung base. IMPRESSION: New patchy consolidation within the left mid and lower lung concerning for pneumonia. Followup PA and lateral chest X-ray is recommended in 3-4 weeks following trial of antibiotic therapy to ensure resolution and exclude underlying malignancy. Heterogeneous opacities right lung base may represent atelectasis. Electronically Signed   By: Lovey Newcomer M.D.   On: 10/21/2018 20:10     ASSESSMENT AND PLAN:  71 year old female patient with history of end-stage renal disease on dialysis, bronchial asthma, COPD, diabetes mellitus type 2, hypertension, GERD currently under hospitalist service for respiratory distress and hypoxia  -Acute respiratory distress with  hypoxia secondary to fluid overload Has been weaned off BiPAP and transferred to medical floor On dialysis to remove excess fluid  -Acute on chronic diastolic heart failure Dialysis to remove excess fluid Medical management to continue  -Acute pulmonary edema Remove excess fluid with dialysis  -ESRD Continue dialysis as per nephrology  -Wean oxygen   All the records are reviewed and case discussed with Care Management/Social Worker. Management plans discussed with the patient, family and they are in agreement.  CODE STATUS: Full code  DVT Prophylaxis: SCDs  TOTAL TIME TAKING CARE OF THIS PATIENT: 35 minutes.   POSSIBLE D/C IN 1 to 2 DAYS, DEPENDING ON CLINICAL CONDITION.  Saundra Shelling M.D on 10/22/2018 at 3:59 PM  Between 7am to 6pm - Pager - [phone]  After 6pm go to [email or web address] - password [email or web address] [facility]  Office  [phone]  CC: Primary care physician; [name], MD  Note: This dictation was prepared with Dragon dictation along with smaller phrase technology. Any transcriptional errors that result from this process are unintentional.

## 2018-10-22 NOTE — Progress Notes (Signed)
Pre HD Treatment    10/22/18 1200  Vital Signs  Temp 98.9 F (37.2 C)  Temp Source Oral  Pulse Rate 87  Pulse Rate Source Monitor  Resp 19  BP (!) 166/62  BP Location Right Arm  BP Method Automatic  Patient Position (if appropriate) Lying  Oxygen Therapy  SpO2 100 %  O2 Device Nasal Cannula  O2 Flow Rate (L/min) 2 L/min  Pain Assessment  Pain Scale 0-10  Pain Score 0  Dialysis Weight  Weight  (Unable to weigh)  Time-Out for Hemodialysis  What Procedure? HD   Pt Identifiers(min of two) First/Last Name;MRN/Account#;Pt's DOB(use if MRN/Acct# not available  Correct Site? Yes  Correct Side? Yes  Correct Procedure? Yes  Consents Verified? Yes  Rad Studies Available? N/A  Safety Precautions Reviewed? Yes  Engineer, civil (consulting) Number 5  Station Number 2  UF/Alarm Test Passed  Conductivity: Meter 13.5  Conductivity: Machine  13.6  pH 7.2  Reverse Osmosis Main  Normal Saline Lot Number [id number]  Dialyzer Lot Number [id number]  Disposable Set Lot Number [id number]  Machine Temperature 98.6 F (37 C)  Musician and Audible Yes  Blood Lines Intact and Secured Yes  Pre Treatment Patient Checks  Vascular access used during treatment Fistula  Hepatitis B Surface Antigen Results Negative  Date Hepatitis B Surface Antigen Drawn 10/01/18  Hepatitis B Surface Antibody  (<10)  Date Hepatitis B Surface Antibody Drawn 09/03/18  Hemodialysis Consent Verified Yes  Hemodialysis Standing Orders Initiated Yes  ECG (Telemetry) Monitor On Yes  Prime Ordered Normal Saline  Length of  DialysisTreatment -hour(s) 3.5 Hour(s)  Dialyzer Elisio 17H NR  Dialysate 2K, 2.5 Ca  Dialysis Anticoagulant None  Dialysate Flow Ordered 600  Blood Flow Rate Ordered 400 mL/min  Ultrafiltration Goal 1.5 Liters  Dialysis Blood Pressure Support Ordered Normal Saline  Education / Care Plan  Dialysis Education Provided Yes  Documented Education in Care Plan Yes  Note  Observations Access without  complications, positive for thrill and brui

## 2018-10-22 NOTE — Progress Notes (Signed)
Post HD Assessment

## 2018-10-22 NOTE — Progress Notes (Signed)
HD tx start    10/22/18 0136  Vital Signs  Pulse Rate 93  Pulse Rate Source Monitor  Resp (!) 25  BP (!) 160/72  BP Location Right Arm  BP Method Automatic  Patient Position (if appropriate) Lying  Oxygen Therapy  SpO2 99 %  O2 Device Nasal Cannula  O2 Flow Rate (L/min) 2 L/min  During Hemodialysis Assessment  Blood Flow Rate (mL/min) 300 mL/min  Arterial Pressure (mmHg) -150 mmHg  Venous Pressure (mmHg) 140 mmHg  Transmembrane Pressure (mmHg) 70 mmHg  Ultrafiltration Rate (mL/min) 830 mL/min  Dialysate Flow Rate (mL/min) 800 ml/min  Conductivity: Machine  13.9  HD Safety Checks Performed Yes  Dialysis Fluid Bolus Normal Saline  Bolus Amount (mL) 250 mL  Intra-Hemodialysis Comments Tx initiated

## 2018-10-22 NOTE — Progress Notes (Signed)
Pt brought from home a bottle containing 9 pills of hydrocodone-acetaminophen and a bottle containing 3 pills of oxycodone HCL.  The pills were counted and sent to pharmacy.  Receipt is in the chart.

## 2018-10-22 NOTE — Progress Notes (Signed)
HD Treatment Initiated    10/22/18 1207  Vital Signs  Pulse Rate Source Monitor  During Hemodialysis Assessment  Blood Flow Rate (mL/min) 300 mL/min  Arterial Pressure (mmHg) -150 mmHg  Venous Pressure (mmHg) 140 mmHg  Transmembrane Pressure (mmHg) 60 mmHg  Ultrafiltration Rate (mL/min) 570 mL/min  Dialysate Flow Rate (mL/min) 600 ml/min  Conductivity: Machine  13.9  HD Safety Checks Performed Yes  Dialysis Fluid Bolus Normal Saline  Bolus Amount (mL) 250 mL  Intra-Hemodialysis Comments Tx initiated;Progressing as prescribed  Note  Observations Cannulation with 16 g per MD, pt now running at 300.

## 2018-10-22 NOTE — Progress Notes (Signed)
Post HD assessment. PT tolerated tx well without complication.Pt c/o cramping during HD tx, MD aware. Net UF 2013, goal met.      10/22/18 0450  Vital Signs  Temp 97.9 F (36.6 C)  Temp Source Oral  Pulse Rate 82  Pulse Rate Source Monitor  Resp 18  BP (!) 170/70  BP Location Right Arm  BP Method Automatic  Patient Position (if appropriate) Lying  Oxygen Therapy  SpO2 100 %  O2 Device Nasal Cannula  O2 Flow Rate (L/min) 2 L/min  Post-Hemodialysis Assessment  Rinseback Volume (mL) 250 mL  KECN 49.1 V  Dialyzer Clearance Lightly streaked  Duration of HD Treatment -hour(s) 3 hour(s)  Hemodialysis Intake (mL) 500 mL  UF Total -Machine (mL) 2513 mL  Net UF (mL) 2013 mL  Tolerated HD Treatment Yes  AVG/AVF Arterial Site Held (minutes) 10 minutes  AVG/AVF Venous Site Held (minutes) 10 minutes  Education / Care Plan  Dialysis Education Provided Yes  Documented Education in Care Plan Yes

## 2018-10-23 LAB — BASIC METABOLIC PANEL
Anion gap: 9 (ref 5–15)
BUN: 15 mg/dL (ref 8–23)
CO2: 35 mmol/L — ABNORMAL HIGH (ref 22–32)
Calcium: 8.9 mg/dL (ref 8.9–10.3)
Chloride: 96 mmol/L — ABNORMAL LOW (ref 98–111)
Creatinine, Ser: 2.83 mg/dL — ABNORMAL HIGH (ref 0.44–1.00)
GFR calc Af Amer: 19 mL/min — ABNORMAL LOW (ref >60)
GFR calc non Af Amer: 16 mL/min — ABNORMAL LOW (ref >60)
Glucose, Bld: 112 mg/dL — ABNORMAL HIGH (ref 70–99)
Potassium: 4.1 mmol/L (ref 3.5–5.1)
Sodium: 140 mmol/L (ref 135–145)

## 2018-10-23 LAB — GLUCOSE, CAPILLARY
Glucose-Capillary: 95 mg/dL (ref 70–99)
Glucose-Capillary: 137 mg/dL — ABNORMAL HIGH (ref 70–99)

## 2018-10-23 NOTE — Progress Notes (Signed)
10/23/2018 11:54 AM  Contacted Orbie Pyo at the [facility] [phone] to pick up patient. Per Orson Gear, she will be here at 1:00 pm today to pick up patient.   Fuller Mandril, RN

## 2018-10-23 NOTE — Progress Notes (Signed)
Advanced care plan.  Purpose of the Encounter: CODE STATUS  Parties in Attendance: Patient  Patient's Decision Capacity: Good  Subjective/Patient's story: Presented to emergency room for shortness of breath   Objective/Medical story Patient had respiratory distress and low oxygen saturation needs BiPAP Has fluid overload and needs dialysis   Goals of care determination:  Advance care directives and goals of care discussed Patient wants everything done which includes CPR, intubation ventilator if the need arises  CODE STATUS: Full code   Time spent discussing advanced care planning: 16 minutes

## 2018-10-23 NOTE — Care Management (Signed)
Phyllis [name] dialysis liaison notified of discharge.    Patient maintain O2 sats on RA, and did not require home O2

## 2018-10-23 NOTE — Care Management (Signed)
SATURATION QUALIFICATIONS: (This note is used to comply with regulatory documentation for home oxygen)  Patient Saturations on Room Air at Rest = 96%  Patient Saturations on Room Air while Ambulating = 95%  Please briefly explain why patient needs home oxygen:  sats checked by [facility] bedside RN.  Does not require O2 to maintain sats

## 2018-10-23 NOTE — Progress Notes (Signed)
10/23/2018 1:39 PM  Ennis Forts Crook to be D/C'd Home per MD order.  Discussed prescriptions and follow up appointments with the patient. Prescriptions given to patient, medication list explained in detail. Pt verbalized understanding.  Allergies as of 10/23/2018      Reactions   Codeine Nausea And Vomiting, Nausea Only   Enalapril Maleate    Other reaction(s): Headache   Nitrofurantoin Swelling, Rash   Other Reaction: swelling of body   Sulfamethoxazole-trimethoprim Swelling   Vicodin [hydrocodone-acetaminophen] Nausea And Vomiting, Nausea Only   2,4-d Dimethylamine (amisol) Rash   Other Reaction: h/a   Baclofen Other (See Comments), Nausea Only   lightheadness ,drowsiness , muscle weakness , twitching in hands    Neosporin [neomycin-bacitracin Zn-polymyx] Other (See Comments), Rash   Other Reaction: irritation Skin irritation   Quinine Rash   Other Reaction: Vomiting rash, h/a, vision   Ultram [tramadol] Palpitations   Zocor [simvastatin] Other (See Comments), Rash   Other Reaction: muscle spasms Muscle pain and spasms   Bactrim [sulfamethoxazole-trimethoprim] Swelling   Levodopa    Macrodantin [nitrofurantoin Macrocrystal] Swelling   Quinine Derivatives Other (See Comments)   Vertigo,nausea vomiting blurred vision headache ears sensitivity   Vasotec [enalapril] Other (See Comments)   Headaches      Medication List    TAKE these medications   albuterol 108 (90 Base) MCG/ACT inhaler Commonly known as:  PROVENTIL HFA;VENTOLIN HFA Inhale 2 puffs into the lungs every 6 (six) hours as needed for wheezing or shortness of breath.   amLODipine 10 MG tablet Commonly known as:  NORVASC Take 10 mg by mouth.   aspirin 81 MG EC tablet Take 1 tablet (81 mg total) by mouth daily.   calcium acetate 667 MG capsule Commonly known as:  PHOSLO Take 667 mg by mouth 3 (three) times daily with meals.   carvedilol 12.5 MG tablet Commonly known as:  COREG Take 12.5 mg by mouth 2  (two) times daily with a meal.   cetirizine 10 MG tablet Commonly known as:  ZYRTEC Take 10 mg by mouth daily.   clopidogrel 75 MG tablet Commonly known as:  PLAVIX Take 1 tablet (75 mg total) by mouth daily.   cyclobenzaprine 10 MG tablet Commonly known as:  FLEXERIL Take 10 mg by mouth daily as needed for muscle spasms.   docusate sodium 100 MG capsule Commonly known as:  COLACE Take 100 mg by mouth 3 (three) times daily as needed for mild constipation.   Fluticasone-Salmeterol 250-50 MCG/DOSE Aepb Commonly known as:  ADVAIR Inhale 1 puff into the lungs 2 (two) times daily as needed (for shortness of breath).   furosemide 80 MG tablet Commonly known as:  LASIX Take 1 tablet (80 mg total) by mouth daily.   HYDROcodone-acetaminophen 7.5-325 MG tablet Commonly known as:  NORCO Take 1 tablet by mouth 2 (two) times daily as needed for moderate pain.   lidocaine-prilocaine cream Commonly known as:  EMLA Apply 1 application topically as needed (prior to treatment).   nitroGLYCERIN 0.4 MG SL tablet Commonly known as:  NITROSTAT Place 1 tablet (0.4 mg total) under the tongue every 5 (five) minutes as needed for chest pain.   omeprazole 20 MG capsule Commonly known as:  PRILOSEC Take 20 mg by mouth 2 (two) times daily before a meal.   ondansetron 4 MG disintegrating tablet Commonly known as:  ZOFRAN-ODT Take 4 mg by mouth 2 (two) times daily as needed for nausea or vomiting.   oxybutynin 15 MG 24 hr  tablet Commonly known as:  DITROPAN XL Take 15 mg by mouth daily.   pravastatin 40 MG tablet Commonly known as:  PRAVACHOL Take 40 mg by mouth at bedtime.   senna 8.6 MG tablet Commonly known as:  SENOKOT Take 1 tablet by mouth daily.   topiramate 25 MG tablet Commonly known as:  TOPAMAX Take 25 mg by mouth daily as needed (for headaches).       Vitals:   10/23/18 0441 10/23/18 1151  BP: (!) 152/71 (!) 149/64  Pulse: 79 81  Resp: 18 18  Temp: 98.2 F (36.8 C)  98.4 F (36.9 C)  SpO2: 100% 97%    Skin clean, dry and intact without evidence of skin break down, no evidence of skin tears noted. IV catheter discontinued intact. Site without signs and symptoms of complications. Dressing and pressure applied. Pt denies pain at this time. No complaints noted.  An After Visit Summary was printed and given to the patient. Patient escorted via [place], and D/C home via private auto.  Fuller Mandril, RN

## 2018-10-23 NOTE — Progress Notes (Signed)
[facility]  ROUNDING NOTE   Subjective:  Patient feeling much better with 2 days of consecutive dialysis. Much less short of breath.    Objective:  Vital signs in last 24 hours:  Temp:  [98 F (36.7 C)-98.9 F (37.2 C)] 98.2 F (36.8 C) (12/04 0441) Pulse Rate:  [71-96] 79 (12/04 0441) Resp:  [14-36] 18 (12/04 0441) BP: (149-177)/(47-89) 152/71 (12/04 0441) SpO2:  [100 %] 100 % (12/04 0441)  Weight change:  Filed Weights   10/21/18 1912 10/21/18 2254 10/22/18 0115  Weight: 60.3 kg 60.5 kg 64.2 kg    Intake/Output: I/O last 3 completed shifts: In: 37.5 [I.V.:37.5] Out: 1308 [Urine:425; MVHQI:6962]   Intake/Output this shift:  Total I/O In: 240 [P.O.:240] Out: 100 [Urine:100]  Physical Exam: General: No acute distress  Head: Normocephalic, atraumatic. Moist oral mucosal membranes  Eyes: Anicteric  Neck: Supple, trachea midline  Lungs:  Basilar rales, normal effort  Heart: S1S2 no rubs  Abdomen:  Soft, nontender, bowel sounds present  Extremities: trace peripheral edema.  Neurologic: Awake, alert, following commands  Skin: No lesions  Access: LUE AVF    Basic Metabolic Panel: Recent Labs  Lab 10/21/18 1917 10/22/18 0157 10/23/18 0451  NA 141 140 140  K 5.5* 5.0 4.1  CL 104 101 96*  CO2 26 25 35*  GLUCOSE 167* 167* 112*  BUN 43* 44* 15  CREATININE 7.51* 7.55* 2.83*  CALCIUM 8.1* 8.0* 8.9  PHOS  --  3.8  3.7  --     Liver Function Tests: Recent Labs  Lab 10/22/18 0157  ALBUMIN 3.9   No results for input(s): LIPASE, AMYLASE in the last 168 hours. No results for input(s): AMMONIA in the last 168 hours.  CBC: Recent Labs  Lab 10/21/18 1917 10/22/18 0157  WBC 9.6 8.3  HGB 11.3* 9.5*  HCT 35.9* 29.8*  MCV 93.2 92.3  PLT 166 143*    Cardiac Enzymes: Recent Labs  Lab 10/21/18 1917 10/22/18 0157 10/22/18 0708  TROPONINI 0.04* 0.10* 0.14*    BNP: Invalid input(s): POCBNP  CBG: Recent Labs  Lab 10/22/18 0339  10/22/18 0754 10/22/18 1741 10/22/18 2108 10/23/18 0806  GLUCAP 128* 137* 93 165* 95    Microbiology: Results for orders placed or performed during the hospital encounter of 10/21/18  MRSA PCR Screening     Status: None   Collection Time: 10/21/18 10:46 PM  Result Value Ref Range Status   MRSA by PCR NEGATIVE NEGATIVE Final    Comment:        The GeneXpert MRSA Assay (FDA approved for NASAL specimens only), is one component of a comprehensive MRSA colonization surveillance program. It is not intended to diagnose MRSA infection nor to guide or monitor treatment for MRSA infections. Performed at [facility], [address] [place], [place] [address]     Coagulation Studies: Recent Labs    10/21/18 1917  LABPROT 13.2  INR 1.01    Urinalysis: No results for input(s): COLORURINE, LABSPEC, PHURINE, GLUCOSEU, HGBUR, BILIRUBINUR, KETONESUR, PROTEINUR, UROBILINOGEN, NITRITE, LEUKOCYTESUR in the last 72 hours.  Invalid input(s): APPERANCEUR    Imaging: Dg Chest Port 1 View  Result Date: 10/22/2018 CLINICAL DATA:  Acute respiratory failure EXAM: PORTABLE CHEST 1 VIEW COMPARISON:  10/21/2018 FINDINGS: Cardiac shadow is stable. Aortic calcifications are again noted. The overall inspiratory effort is poor although there has been clearing of the infiltrate in the left base when compared with the previous day. No sizable effusion is noted. No bony  abnormality is seen. IMPRESSION: Interval clearing of left basilar infiltrate. Electronically Signed   By: Inez Catalina M.D.   On: 10/22/2018 07:12   Dg Chest Portable 1 View  Result Date: 10/21/2018 CLINICAL DATA:  Patient with shortness of breath EXAM: PORTABLE CHEST 1 VIEW COMPARISON:  Chest radiograph 03/17/2018 FINDINGS: Monitoring leads overlie the patient. Stable cardiac and mediastinal contours. Aortic atherosclerosis. Low lung volumes. New patchy consolidative opacities within the left mid lower lung and right lung  base. IMPRESSION: New patchy consolidation within the left mid and lower lung concerning for pneumonia. Followup PA and lateral chest X-ray is recommended in 3-4 weeks following trial of antibiotic therapy to ensure resolution and exclude underlying malignancy. Heterogeneous opacities right lung base may represent atelectasis. Electronically Signed   By: Lovey Newcomer M.D.   On: 10/21/2018 20:10     Medications:    . amLODipine  10 mg Oral Daily  . aspirin EC  81 mg Oral Daily  . carvedilol  25 mg Oral BID WC  . clopidogrel  75 mg Oral Daily  . epoetin (EPOGEN/PROCRIT) injection  4,000 Units Intravenous Q T,Th,Sa-HD  . furosemide  80 mg Oral Daily  . heparin  5,000 Units Subcutaneous Q8H  . insulin aspart  0-5 Units Subcutaneous QHS  . insulin aspart  0-9 Units Subcutaneous TID WC  . mometasone-formoterol  2 puff Inhalation BID  . oxybutynin  15 mg Oral QHS  . pantoprazole sodium  20 mg Oral BID  . pravastatin  40 mg Oral QHS   acetaminophen **OR** acetaminophen, HYDROcodone-acetaminophen, ipratropium-albuterol, ondansetron **OR** ondansetron (ZOFRAN) IV, topiramate  Assessment/ Plan:  72 y.o. female with end stage renal disease on hemodialysis, hypertension, hyperlipidemia, gout, GERD, diabetes mellitus type II, COPD  TS (twice a week) [place].  1.  End-stage renal disease on hemodialysis Tuesday/Saturday.  Patient feeling much better with 2 consecutive days of dialysis.  We advised the patient to monitor her fluid intake as an outpatient.  She verbalized understanding of this.  Next dialysis for Saturday.  However we may need to consider dialysis 3 times a week if she continues to have problems with volume.  2.  Shortness of breath/Pulmonary edema.  Chest x-ray apparently demonstrated focal consolidation.  Blood pressure was quite high upon admission therefore pulmonary edema could also be a concern.  Patient significantly improved with dialysis.  Infiltrate also  improved with dialysis. -Clinically improved.  3.  Anemia of chronic kidney disease.  Continue Epogen as an outpatient.  4.  Secondary hyperparathyroidism.  Phosphorus 3.7 at last check.   LOS: 2 Phyllis [name] 12/4/201910:48 AM

## 2018-10-23 NOTE — Discharge Summary (Signed)
[facility] NAME: Phyllis [name]    MR#:  [id number]  DATE OF BIRTH:  [birth year]  DATE OF ADMISSION:  10/21/2018 ADMITTING PHYSICIAN: Lance Coon, MD  DATE OF DISCHARGE: 10/23/2018  1:20 PM  PRIMARY CARE PHYSICIAN: [name], MD   ADMISSION DIAGNOSIS:  Flash pulmonary edema ([place]) [J81.0] Acute respiratory failure with hypoxia ([place]) [J96.01] HCAP (healthcare-associated pneumonia) [J18.9]  DISCHARGE DIAGNOSIS:  Principal Problem:   Acute respiratory failure with hypoxia ([place]) Active Problems:   End stage renal disease on dialysis ([place])   Type 2 diabetes mellitus with other specified complication (HCC)   Acute on chronic diastolic CHF (congestive heart failure) (HCC)   Hyperlipidemia   Accelerated hypertension   Flash pulmonary edema ([place])   SECONDARY DIAGNOSIS:   Past Medical History:  Diagnosis Date  . Allergy   . Anemia of chronic disease   . Anxiety   . Aortic atherosclerosis ([place])   . Asthma   . Chronic back pain   . COPD (chronic obstructive pulmonary disease) ([place])   . Diabetes mellitus with complication ([place])   . ESRD on hemodialysis ([place])    a. Tues/Sat; b. 2/2 small kidneys  . Essential hypertension   . Fistula    lower left arm  . GERD (gastroesophageal reflux disease)   . Gout   . History of echocardiogram    a. TTE 01/2014: nl LV sys fxn, no valvular abnormalities; b. TTE 11/16: nl EF, mild LVH  . History of exercise stress test    a. 01/2014: no evidence of ischemia; b. Lexiscan 08/2015: no sig ischemia, severe GI uptake artifact, low risk; c. CPET @ [facility] 09/2016: exercised 3 min 12 sec on bike without incline, 2.28 METs, VO2 of 8.1, 48% of predicted, indicating mod to sev functional impairment, evidence of blunted HR, stroke volume, and BP augmentation as well as ventilation-perfusion mismatch with exercise  . HLD (hyperlipidemia)   . 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 Renal insufficiency 09/24/2017   Dialysis patient.  . Sleep apnea      ADMITTING HISTORY Phyllis [name]  is a 72 y.o. female who presents with chief complaint as above.  Patient presents to the ED today with significant respiratory distress.  She states that this was acute in onset today.  However, she does state that over the past week she has gone to the dialysis on 2 separate occasions wheezing.  Tonight when she presented to the ED her blood pressure was significantly elevated, with systolic in the 573U and diastolic in the 202R per report.  She was hypoxic, and placed on BiPAP.  She required nitroglycerin drip to reduce her blood pressure.  Chest x-ray shows some vascular congestion, but also some left-sided opacity consistent with possible infiltrate.  Hospitalist were called for admission and further treatment  HOSPITAL COURSE:  Was admitted to stepdown unit on BiPAP for respiratory distress and hypoxia.  She was dialyzed and excess fluid was removed.  Initially because of her elevated blood pressure she had to be put on nitroglycerin drip to control the blood pressure.  Pulmonary edema improved fluid overload resolved with dialysis.  Shortness of breath improved.  She was weaned of BiPAP and she was also weaned off oxygen via nasal cannula.  Elevated troponin secondary to demand ischemia. Blood pressure also normalized.  CONSULTS OBTAINED:  Nephrology consult  DRUG ALLERGIES:   Allergies  Allergen Reactions  . Codeine  Nausea And Vomiting and Nausea Only  . Enalapril Maleate     Other reaction(s): Headache  . Nitrofurantoin Swelling and Rash    Other Reaction: swelling of body  . Sulfamethoxazole-Trimethoprim Swelling  . Vicodin [Hydrocodone-Acetaminophen] Nausea And Vomiting and Nausea Only  . 2,4-D Dimethylamine (Amisol) Rash    Other Reaction: h/a  . Baclofen Other (See Comments) and Nausea Only    lightheadness ,drowsiness , muscle weakness , twitching in hands   .  Neosporin [Neomycin-Bacitracin Zn-Polymyx] Other (See Comments) and Rash    Other Reaction: irritation Skin irritation   . Quinine Rash    Other Reaction: Vomiting rash, h/a, vision  . Ultram [Tramadol] Palpitations  . Zocor [Simvastatin] Other (See Comments) and Rash    Other Reaction: muscle spasms Muscle pain and spasms  . Bactrim [Sulfamethoxazole-Trimethoprim] Swelling  . Levodopa   . Macrodantin [Nitrofurantoin Macrocrystal] Swelling  . Quinine Derivatives Other (See Comments)    Vertigo,nausea vomiting blurred vision headache ears sensitivity   . Vasotec [Enalapril] Other (See Comments)    Headaches     DISCHARGE MEDICATIONS:   Allergies as of 10/23/2018      Reactions   Codeine Nausea And Vomiting, Nausea Only   Enalapril Maleate    Other reaction(s): Headache   Nitrofurantoin Swelling, Rash   Other Reaction: swelling of body   Sulfamethoxazole-trimethoprim Swelling   Vicodin [hydrocodone-acetaminophen] Nausea And Vomiting, Nausea Only   2,4-d Dimethylamine (amisol) Rash   Other Reaction: h/a   Baclofen Other (See Comments), Nausea Only   lightheadness ,drowsiness , muscle weakness , twitching in hands    Neosporin [neomycin-bacitracin Zn-polymyx] Other (See Comments), Rash   Other Reaction: irritation Skin irritation   Quinine Rash   Other Reaction: Vomiting rash, h/a, vision   Ultram [tramadol] Palpitations   Zocor [simvastatin] Other (See Comments), Rash   Other Reaction: muscle spasms Muscle pain and spasms   Bactrim [sulfamethoxazole-trimethoprim] Swelling   Levodopa    Macrodantin [nitrofurantoin Macrocrystal] Swelling   Quinine Derivatives Other (See Comments)   Vertigo,nausea vomiting blurred vision headache ears sensitivity   Vasotec [enalapril] Other (See Comments)   Headaches      Medication List    TAKE these medications   albuterol 108 (90 Base) MCG/ACT inhaler Commonly known as:  PROVENTIL HFA;VENTOLIN HFA Inhale 2 puffs into the lungs  every 6 (six) hours as needed for wheezing or shortness of breath.   amLODipine 10 MG tablet Commonly known as:  NORVASC Take 10 mg by mouth.   aspirin 81 MG EC tablet Take 1 tablet (81 mg total) by mouth daily.   calcium acetate 667 MG capsule Commonly known as:  PHOSLO Take 667 mg by mouth 3 (three) times daily with meals.   carvedilol 12.5 MG tablet Commonly known as:  COREG Take 12.5 mg by mouth 2 (two) times daily with a meal.   cetirizine 10 MG tablet Commonly known as:  ZYRTEC Take 10 mg by mouth daily.   clopidogrel 75 MG tablet Commonly known as:  PLAVIX Take 1 tablet (75 mg total) by mouth daily.   cyclobenzaprine 10 MG tablet Commonly known as:  FLEXERIL Take 10 mg by mouth daily as needed for muscle spasms.   docusate sodium 100 MG capsule Commonly known as:  COLACE Take 100 mg by mouth 3 (three) times daily as needed for mild constipation.   Fluticasone-Salmeterol 250-50 MCG/DOSE Aepb Commonly known as:  ADVAIR Inhale 1 puff into the lungs 2 (two) times daily as needed (for shortness  of breath).   furosemide 80 MG tablet Commonly known as:  LASIX Take 1 tablet (80 mg total) by mouth daily.   HYDROcodone-acetaminophen 7.5-325 MG tablet Commonly known as:  NORCO Take 1 tablet by mouth 2 (two) times daily as needed for moderate pain.   lidocaine-prilocaine cream Commonly known as:  EMLA Apply 1 application topically as needed (prior to treatment).   nitroGLYCERIN 0.4 MG SL tablet Commonly known as:  NITROSTAT Place 1 tablet (0.4 mg total) under the tongue every 5 (five) minutes as needed for chest pain.   omeprazole 20 MG capsule Commonly known as:  PRILOSEC Take 20 mg by mouth 2 (two) times daily before a meal.   ondansetron 4 MG disintegrating tablet Commonly known as:  ZOFRAN-ODT Take 4 mg by mouth 2 (two) times daily as needed for nausea or vomiting.   oxybutynin 15 MG 24 hr tablet Commonly known as:  DITROPAN XL Take 15 mg by mouth  daily.   pravastatin 40 MG tablet Commonly known as:  PRAVACHOL Take 40 mg by mouth at bedtime.   senna 8.6 MG tablet Commonly known as:  SENOKOT Take 1 tablet by mouth daily.   topiramate 25 MG tablet Commonly known as:  TOPAMAX Take 25 mg by mouth daily as needed (for headaches).       Today  Patient seen and evaluated today Weaned off oxygen by nasal cannula No shortness of breath Blood pressure stable  VITAL SIGNS:  Blood pressure (!) 149/64, pulse 81, temperature 98.4 F (36.9 C), temperature source Oral, resp. rate 18, height 4\' 10"  (1.473 m), weight 64.2 kg, SpO2 97 %.  I/O:    Intake/Output Summary (Last 24 hours) at 10/23/2018 1511 Last data filed at 10/23/2018 1218 Gross per 24 hour  Intake 240 ml  Output 1930 ml  Net -1690 ml    PHYSICAL EXAMINATION:  Physical Exam  GENERAL:  72 y.o.-year-old patient lying in the bed with no acute distress.  LUNGS: Normal breath sounds bilaterally, no wheezing, rales,rhonchi or crepitation. No use of accessory muscles of respiration.  CARDIOVASCULAR: S1, S2 normal. No murmurs, rubs, or gallops.  ABDOMEN: Soft, non-tender, non-distended. Bowel sounds present. No organomegaly or mass.  NEUROLOGIC: Moves all 4 extremities. PSYCHIATRIC: The patient is alert and oriented x 3.  SKIN: No obvious rash, lesion, or ulcer.   DATA REVIEW:   CBC Recent Labs  Lab 10/22/18 0157  WBC 8.3  HGB 9.5*  HCT 29.8*  PLT 143*    Chemistries  Recent Labs  Lab 10/23/18 0451  NA 140  K 4.1  CL 96*  CO2 35*  GLUCOSE 112*  BUN 15  CREATININE 2.83*  CALCIUM 8.9    Cardiac Enzymes Recent Labs  Lab 10/22/18 0708  TROPONINI 0.14*    Microbiology Results  Results for orders placed or performed during the hospital encounter of 10/21/18  MRSA PCR Screening     Status: None   Collection Time: 10/21/18 10:46 PM  Result Value Ref Range Status   MRSA by PCR NEGATIVE NEGATIVE Final    Comment:        The GeneXpert MRSA Assay  (FDA approved for NASAL specimens only), is one component of a comprehensive MRSA colonization surveillance program. It is not intended to diagnose MRSA infection nor to guide or monitor treatment for MRSA infections. Performed at [facility], [address], [place], [place] [address]     RADIOLOGY:  Dg Chest Port 1 View  Result Date: 10/22/2018 CLINICAL  DATA:  Acute respiratory failure EXAM: PORTABLE CHEST 1 VIEW COMPARISON:  10/21/2018 FINDINGS: Cardiac shadow is stable. Aortic calcifications are again noted. The overall inspiratory effort is poor although there has been clearing of the infiltrate in the left base when compared with the previous day. No sizable effusion is noted. No bony abnormality is seen. IMPRESSION: Interval clearing of left basilar infiltrate. Electronically Signed   By: Inez Catalina M.D.   On: 10/22/2018 07:12   Dg Chest Portable 1 View  Result Date: 10/21/2018 CLINICAL DATA:  Patient with shortness of breath EXAM: PORTABLE CHEST 1 VIEW COMPARISON:  Chest radiograph 03/17/2018 FINDINGS: Monitoring leads overlie the patient. Stable cardiac and mediastinal contours. Aortic atherosclerosis. Low lung volumes. New patchy consolidative opacities within the left mid lower lung and right lung base. IMPRESSION: New patchy consolidation within the left mid and lower lung concerning for pneumonia. Followup PA and lateral chest X-ray is recommended in 3-4 weeks following trial of antibiotic therapy to ensure resolution and exclude underlying malignancy. Heterogeneous opacities right lung base may represent atelectasis. Electronically Signed   By: Lovey Newcomer M.D.   On: 10/21/2018 20:10    Follow up with PCP in 1 week.  Management plans discussed with the patient, family and they are in agreement.  CODE STATUS: Full code    Code Status Orders  (From admission, onward)         Start     Ordered   10/21/18 2236  Full code  Continuous     10/21/18 2235         Code Status History    Date Active Date Inactive Code Status Order ID Comments User Context   03/16/2018 0806 03/17/2018 1637 Full Code [id number]  Loletha Grayer, MD ED   01/16/2018 1630 01/18/2018 2158 Full Code [id number]  Dustin Flock, MD Inpatient   01/16/2018 1340 01/16/2018 1630 DNR [id number]  Dustin Flock, MD ED   09/18/2017 1348 09/19/2017 1513 DNR [id number]  Loletha Grayer, MD ED   04/12/2015 1056 04/12/2015 1631 Full Code [id number]  Dew, Erskine Squibb, MD Inpatient      TOTAL TIME TAKING CARE OF THIS PATIENT ON DAY OF DISCHARGE: more than 35 minutes.   Saundra Shelling M.D on 10/23/2018 at 3:11 PM  Between 7am to 6pm - Pager - [phone]  After 6pm go to [email or web address] - password [email or web address] [facility]  Office  [phone]  CC: Primary care physician; [name], MD  Note: This dictation was prepared with Dragon dictation along with smaller phrase technology. Any transcriptional errors that result from this process are unintentional.

## 2018-11-01 ENCOUNTER — Encounter: Payer: Self-pay | Admitting: Family

## 2018-11-01 ENCOUNTER — Ambulatory Visit: Payer: Medicare Other | Attending: Family | Admitting: Family

## 2018-11-01 VITALS — BP 155/57 | Resp 18 | Ht <= 58 in | Wt 132.4 lb

## 2018-11-01 DIAGNOSIS — E785 Hyperlipidemia, unspecified: Secondary | ICD-10-CM | POA: Insufficient documentation

## 2018-11-01 DIAGNOSIS — Z7951 Long term (current) use of inhaled steroids: Secondary | ICD-10-CM | POA: Insufficient documentation

## 2018-11-01 DIAGNOSIS — Z992 Dependence on renal dialysis: Secondary | ICD-10-CM | POA: Diagnosis not present

## 2018-11-01 DIAGNOSIS — G4733 Obstructive sleep apnea (adult) (pediatric): Secondary | ICD-10-CM | POA: Diagnosis not present

## 2018-11-01 DIAGNOSIS — J449 Chronic obstructive pulmonary disease, unspecified: Secondary | ICD-10-CM | POA: Diagnosis not present

## 2018-11-01 DIAGNOSIS — I700 Atherosclerosis of aorta: Secondary | ICD-10-CM | POA: Insufficient documentation

## 2018-11-01 DIAGNOSIS — Z8249 Family history of ischemic heart disease and other diseases of the circulatory system: Secondary | ICD-10-CM | POA: Diagnosis not present

## 2018-11-01 DIAGNOSIS — I5032 Chronic diastolic (congestive) heart failure: Secondary | ICD-10-CM

## 2018-11-01 DIAGNOSIS — D649 Anemia, unspecified: Secondary | ICD-10-CM | POA: Diagnosis not present

## 2018-11-01 DIAGNOSIS — M545 Low back pain: Secondary | ICD-10-CM | POA: Diagnosis not present

## 2018-11-01 DIAGNOSIS — Z7982 Long term (current) use of aspirin: Secondary | ICD-10-CM | POA: Diagnosis not present

## 2018-11-01 DIAGNOSIS — I509 Heart failure, unspecified: Secondary | ICD-10-CM | POA: Diagnosis present

## 2018-11-01 DIAGNOSIS — Z79899 Other long term (current) drug therapy: Secondary | ICD-10-CM | POA: Insufficient documentation

## 2018-11-01 DIAGNOSIS — N186 End stage renal disease: Secondary | ICD-10-CM | POA: Insufficient documentation

## 2018-11-01 DIAGNOSIS — Z7902 Long term (current) use of antithrombotics/antiplatelets: Secondary | ICD-10-CM | POA: Diagnosis not present

## 2018-11-01 DIAGNOSIS — E1122 Type 2 diabetes mellitus with diabetic chronic kidney disease: Secondary | ICD-10-CM | POA: Diagnosis not present

## 2018-11-01 DIAGNOSIS — M109 Gout, unspecified: Secondary | ICD-10-CM | POA: Insufficient documentation

## 2018-11-01 DIAGNOSIS — I132 Hypertensive heart and chronic kidney disease with heart failure and with stage 5 chronic kidney disease, or end stage renal disease: Secondary | ICD-10-CM | POA: Insufficient documentation

## 2018-11-01 DIAGNOSIS — K219 Gastro-esophageal reflux disease without esophagitis: Secondary | ICD-10-CM | POA: Diagnosis not present

## 2018-11-01 DIAGNOSIS — F419 Anxiety disorder, unspecified: Secondary | ICD-10-CM | POA: Diagnosis not present

## 2018-11-01 DIAGNOSIS — G8929 Other chronic pain: Secondary | ICD-10-CM | POA: Diagnosis not present

## 2018-11-01 DIAGNOSIS — I10 Essential (primary) hypertension: Secondary | ICD-10-CM

## 2018-11-01 NOTE — Patient Instructions (Addendum)
Continue weighing daily and call for an overnight weight gain of > 2 pounds or a weekly weight gain of >5 pounds.   Low-Sodium Eating Plan Sodium, which is an element that makes up salt, helps you maintain a healthy balance of fluids in your body. Too much sodium can increase your blood pressure and cause fluid and waste to be held in your body. Your health care provider or dietitian may recommend following this plan if you have high blood pressure (hypertension), kidney disease, liver disease, or heart failure. Eating less sodium can help lower your blood pressure, reduce swelling, and protect your heart, liver, and kidneys. What are tips for following this plan? General guidelines  Most people on this plan should limit their sodium intake to 2,000 mg (milligrams) of sodium each day. Reading food labels  The Nutrition Facts label lists the amount of sodium in one serving of the food. If you eat more than one serving, you must multiply the listed amount of sodium by the number of servings.  Choose foods with less than 140 mg of sodium per serving.  Avoid foods with 300 mg of sodium or more per serving. Shopping  Look for lower-sodium products, often labeled as "low-sodium" or "no salt added."  Always check the sodium content even if foods are labeled as "unsalted" or "no salt added".  Buy fresh foods. ? Avoid canned foods and premade or frozen meals. ? Avoid canned, cured, or processed meats  Buy breads that have less than 80 mg of sodium per slice. Cooking  Eat more home-cooked food and less restaurant, buffet, and fast food.  Avoid adding salt when cooking. Use salt-free seasonings or herbs instead of table salt or sea salt. Check with your health care provider or pharmacist before using salt substitutes.  Cook with plant-based oils, such as canola, sunflower, or olive oil. Meal planning  When eating at a restaurant, ask that your food be prepared with less salt or no salt, if  possible.  Avoid foods that contain MSG (monosodium glutamate). MSG is sometimes added to Chinese food, bouillon, and some canned foods. What foods are recommended? The items listed may not be a complete list. Talk with your dietitian about what dietary choices are best for you. Grains Low-sodium cereals, including oats, puffed wheat and rice, and shredded wheat. Low-sodium crackers. Unsalted rice. Unsalted pasta. Low-sodium bread. Whole-grain breads and whole-grain pasta. Vegetables Fresh or frozen vegetables. "No salt added" canned vegetables. "No salt added" tomato sauce and paste. Low-sodium or reduced-sodium tomato and vegetable juice. Fruits Fresh, frozen, or canned fruit. Fruit juice. Meats and other protein foods Fresh or frozen (no salt added) meat, poultry, seafood, and fish. Low-sodium canned tuna and salmon. Unsalted nuts. Dried peas, beans, and lentils without added salt. Unsalted canned beans. Eggs. Unsalted nut butters. Dairy Milk. Soy milk. Cheese that is naturally low in sodium, such as ricotta cheese, fresh mozzarella, or Swiss cheese Low-sodium or reduced-sodium cheese. Cream cheese. Yogurt. Fats and oils Unsalted butter. Unsalted margarine with no trans fat. Vegetable oils such as canola or olive oils. Seasonings and other foods Fresh and dried herbs and spices. Salt-free seasonings. Low-sodium mustard and ketchup. Sodium-free salad dressing. Sodium-free light mayonnaise. Fresh or refrigerated horseradish. Lemon juice. Vinegar. Homemade, reduced-sodium, or low-sodium soups. Unsalted popcorn and pretzels. Low-salt or salt-free chips. What foods are not recommended? The items listed may not be a complete list. Talk with your dietitian about what dietary choices are best for you. Grains Instant hot cereals.   Bread stuffing, pancake, and biscuit mixes. Croutons. Seasoned rice or pasta mixes. Noodle soup cups. Boxed or frozen macaroni and cheese. Regular salted crackers.  Self-rising flour. Vegetables Sauerkraut, pickled vegetables, and relishes. Olives. French fries. Onion rings. Regular canned vegetables (not low-sodium or reduced-sodium). Regular canned tomato sauce and paste (not low-sodium or reduced-sodium). Regular tomato and vegetable juice (not low-sodium or reduced-sodium). Frozen vegetables in sauces. Meats and other protein foods Meat or fish that is salted, canned, smoked, spiced, or pickled. Bacon, ham, sausage, hotdogs, corned beef, chipped beef, packaged lunch meats, salt pork, jerky, pickled herring, anchovies, regular canned tuna, sardines, salted nuts. Dairy Processed cheese and cheese spreads. Cheese curds. Blue cheese. Feta cheese. String cheese. Regular cottage cheese. Buttermilk. Canned milk. Fats and oils Salted butter. Regular margarine. Ghee. Bacon fat. Seasonings and other foods Onion salt, garlic salt, seasoned salt, table salt, and sea salt. Canned and packaged gravies. Worcestershire sauce. Tartar sauce. Barbecue sauce. Teriyaki sauce. Soy sauce, including reduced-sodium. Steak sauce. Fish sauce. Oyster sauce. Cocktail sauce. Horseradish that you find on the shelf. Regular ketchup and mustard. Meat flavorings and tenderizers. Bouillon cubes. Hot sauce and Tabasco sauce. Premade or packaged marinades. Premade or packaged taco seasonings. Relishes. Regular salad dressings. Salsa. Potato and tortilla chips. Corn chips and puffs. Salted popcorn and pretzels. Canned or dried soups. Pizza. Frozen entrees and pot pies. Summary  Eating less sodium can help lower your blood pressure, reduce swelling, and protect your heart, liver, and kidneys.  Most people on this plan should limit their sodium intake to 1,500-2,000 mg (milligrams) of sodium each day.  Canned, boxed, and frozen foods are high in sodium. Restaurant foods, fast foods, and pizza are also very high in sodium. You also get sodium by adding salt to food.  Try to cook at home, eat  more fresh fruits and vegetables, and eat less fast food, canned, processed, or prepared foods. This information is not intended to replace advice given to you by your health care provider. Make sure you discuss any questions you have with your health care provider. Document Released: 04/28/2002 Document Revised: 10/30/2016 Document Reviewed: 10/30/2016 Elsevier Interactive Patient Education  2018 [employer]  

## 2018-11-01 NOTE — Progress Notes (Signed)
Patient ID: Phyllis [name], female    DOB: 1946-08-11, 72 y.o.   MRN: [id number]  HPI  Ms Dirden is a 72 y/o female with a history of asthma, DM, HTN, CKD, hyperlipidemia, COPD, obstructive sleep apnea, GERD, gout, anemia, anxiety and ESRD on dialysis.   Echo report from 12/27/17 reviewed and showed an EF of 55-60% along with mild MS, mild/moderate AS and a normal PA pressure.   Admitted 10/21/18 due to acute on chronic HF. Initially needed bipap and NTG drip due to HTN. Had dialysis while admitted. Elevated troponin thought to be due to demand ischemia. She was discharged after 2 days.   She presents today for her initial visit with a chief complaint of moderate fatigue upon minimal exertion. She describes this as chronic in nature having been present for several years. She has associated chest tightness, shortness of breath and dizziness along with this. She denies any difficulty sleeping, abdominal distention, palpitations, pedal edema or chest pain. She is currently not weighing herself as she doesn't have any scales.   Past Medical History:  Diagnosis Date  . Allergy   . Anemia of chronic disease   . Anxiety   . Aortic atherosclerosis ([place])   . Asthma   . CHF (congestive heart failure) ([place])   . Chronic back pain   . COPD (chronic obstructive pulmonary disease) ([place])   . Diabetes mellitus with complication ([place])   . ESRD on hemodialysis ([place])    a. Tues/Sat; b. 2/2 small kidneys  . Essential hypertension   . Fistula    lower left arm  . GERD (gastroesophageal reflux disease)   . Gout   . History of echocardiogram    a. TTE 01/2014: nl LV sys fxn, no valvular abnormalities; b. TTE 11/16: nl EF, mild LVH  . History of exercise stress test    a. 01/2014: no evidence of ischemia; b. Lexiscan 08/2015: no sig ischemia, severe GI uptake artifact, low risk; c. CPET @ [facility] 09/2016: exercised 3 min 12 sec on bike without incline, 2.28 METs, VO2 of 8.1, 48% of predicted, indicating  mod to sev functional impairment, evidence of blunted HR, stroke volume, and BP augmentation as well as ventilation-perfusion mismatch with exercise  . HLD (hyperlipidemia)   . 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 Renal insufficiency 09/24/2017   Dialysis patient.  . Sleep apnea    Past Surgical History:  Procedure Laterality Date  . carpel tunnel    . GALLBLADDER SURGERY    . PERIPHERAL VASCULAR CATHETERIZATION N/A 04/12/2015   Procedure: A/V Shuntogram/Fistulagram;  Surgeon: Algernon Huxley, MD;  Location: [place] [facility];  Service: Cardiovascular;  Laterality: N/A;  . PERIPHERAL VASCULAR CATHETERIZATION N/A 04/12/2015   Procedure: A/V Shunt Intervention;  Surgeon: Algernon Huxley, MD;  Location: [place] [facility];  Service: Cardiovascular;  Laterality: N/A;  . PERIPHERAL VASCULAR CATHETERIZATION N/A 06/09/2015   Procedure: Dialysis/Perma Catheter Removal;  Surgeon: Katha Cabal, MD;  Location: [place] [facility];  Service: Cardiovascular;  Laterality: N/A;   Family History  Problem Relation Age of Onset  . Hypertension Mother   . Hyperlipidemia Mother   . Heart disease Father   . Heart attack Father 53  . Hypertension Father   . Hyperlipidemia Father   . Heart disease Brother        CABG   . Heart attack Brother   . Breast cancer Neg Hx    Social  History   Tobacco Use  . Smoking status: Never Smoker  . Smokeless tobacco: Never Used  Substance Use Topics  . Alcohol use: No   Allergies  Allergen Reactions  . Codeine Nausea And Vomiting and Nausea Only  . Enalapril Maleate     Other reaction(s): Headache  . Nitrofurantoin Swelling and Rash    Other Reaction: swelling of body  . Sulfamethoxazole-Trimethoprim Swelling  . Vicodin [Hydrocodone-Acetaminophen] Nausea And Vomiting and Nausea Only  . 2,4-D Dimethylamine (Amisol) Rash    Other Reaction: h/a  . Baclofen Other (See Comments) and Nausea Only    lightheadness ,drowsiness , muscle  weakness , twitching in hands   . Neosporin [Neomycin-Bacitracin Zn-Polymyx] Other (See Comments) and Rash    Other Reaction: irritation Skin irritation   . Quinine Rash    Other Reaction: Vomiting rash, h/a, vision  . Ultram [Tramadol] Palpitations  . Zocor [Simvastatin] Other (See Comments) and Rash    Other Reaction: muscle spasms Muscle pain and spasms  . Bactrim [Sulfamethoxazole-Trimethoprim] Swelling  . Levodopa   . Macrodantin [Nitrofurantoin Macrocrystal] Swelling  . Quinine Derivatives Other (See Comments)    Vertigo,nausea vomiting blurred vision headache ears sensitivity   . Vasotec [Enalapril] Other (See Comments)    Headaches    Prior to Admission medications   Medication Sig Start Date End Date Taking? Authorizing Provider  albuterol (PROVENTIL HFA;VENTOLIN HFA) 108 (90 Base) MCG/ACT inhaler Inhale 2 puffs into the lungs every 6 (six) hours as needed for wheezing or shortness of breath.   Yes [provider]  amLODipine (NORVASC) 10 MG tablet Take 10 mg by mouth.  12/29/13  Yes [provider]  aspirin EC 81 MG EC tablet Take 1 tablet (81 mg total) by mouth daily. 09/20/17  Yes Mody, Ulice Bold, MD  calcium acetate (PHOSLO) 667 MG capsule Take 667 mg by mouth 3 (three) times daily with meals.    Yes [provider]  carvedilol (COREG) 12.5 MG tablet Take 12.5 mg by mouth 2 (two) times daily with a meal.  01/09/14  Yes [provider]  cetirizine (ZYRTEC) 10 MG tablet Take 10 mg by mouth daily.    Yes [provider]  clopidogrel (PLAVIX) 75 MG tablet Take 1 tablet (75 mg total) by mouth daily. 01/18/18  Yes Epifanio Lesches, MD  cyclobenzaprine (FLEXERIL) 10 MG tablet Take 10 mg by mouth daily as needed for muscle spasms.    Yes [provider]  docusate sodium (COLACE) 100 MG capsule Take 100 mg by mouth 3 (three) times daily as needed for mild constipation.   Yes [provider]  Fluticasone-Salmeterol (ADVAIR)  250-50 MCG/DOSE AEPB Inhale 1 puff into the lungs 2 (two) times daily as needed (for shortness of breath).    Yes [provider]  furosemide (LASIX) 80 MG tablet Take 1 tablet (80 mg total) by mouth daily. 03/17/18  Yes Wieting, Richard, MD  HYDROcodone-acetaminophen (NORCO) 7.5-325 MG tablet Take 1 tablet by mouth 2 (two) times daily as needed for moderate pain.    Yes [provider]  lidocaine-prilocaine (EMLA) cream Apply 1 application topically as needed (prior to treatment).    Yes [provider]  nitroGLYCERIN (NITROSTAT) 0.4 MG SL tablet Place 1 tablet (0.4 mg total) under the tongue every 5 (five) minutes as needed for chest pain. 09/21/17  Yes Strader, [place] M, PA-C  omeprazole (PRILOSEC) 20 MG capsule Take 20 mg by mouth 2 (two) times daily before a meal.   Yes  [provider]  ondansetron (ZOFRAN-ODT) 4 MG disintegrating tablet Take 4 mg by mouth 2 (two) times daily as needed for nausea or vomiting.    Yes [provider]  oxybutynin (DITROPAN XL) 15 MG 24 hr tablet Take 15 mg by mouth daily. 12/24/17  Yes [provider]  pravastatin (PRAVACHOL) 40 MG tablet Take 40 mg by mouth at bedtime.    Yes [provider]  senna (SENOKOT) 8.6 MG tablet Take 1 tablet by mouth daily.   Yes [provider]  topiramate (TOPAMAX) 25 MG tablet Take 25 mg by mouth daily as needed (for headaches).    Yes [provider]   Review of Systems  Constitutional: Positive for fatigue. Negative for appetite change.  HENT: Positive for congestion. Negative for postnasal drip and sore throat.   Eyes: Negative.   Respiratory: Positive for chest tightness and shortness of breath.   Cardiovascular: Negative for chest pain, palpitations and leg swelling.  Gastrointestinal: Negative for abdominal distention and abdominal pain.  Endocrine: Negative.   Genitourinary: Negative.   Musculoskeletal: Positive for neck pain. Negative for back  pain.  Skin: Negative.   Allergic/Immunologic: Negative.   Neurological: Positive for dizziness (seeing ENT for balance issues). Negative for light-headedness.  Hematological: Negative for adenopathy. Does not bruise/bleed easily.  Psychiatric/Behavioral: Negative for dysphoric mood and sleep disturbance (sleeping in recliner due to difficulty getting in her tall bed). The patient is not nervous/anxious.    Vitals:   11/01/18 1136  Resp: 18  Weight: 132 lb 6 oz (60 kg)  Height: 4\' 10"  (1.473 m)   Wt Readings from Last 3 Encounters:  11/01/18 132 lb 6 oz (60 kg)  10/22/18 141 lb 8.6 oz (64.2 kg)  10/16/18 133 lb 12.8 oz (60.7 kg)   Lab Results  Component Value Date   CREATININE 2.83 (H) 10/23/2018   CREATININE 7.55 (H) 10/22/2018   CREATININE 7.51 (H) 10/21/2018    Physical Exam Vitals signs and nursing note reviewed.  Constitutional:      Appearance: Normal appearance.  HENT:     Head: Normocephalic and atraumatic.  Neck:     Musculoskeletal: Normal range of motion and neck supple.  Cardiovascular:     Rate and Rhythm: Normal rate and regular rhythm.  Pulmonary:     Effort: Pulmonary effort is normal.     Breath sounds: Normal breath sounds. No rales.  Abdominal:     General: There is no distension.     Palpations: Abdomen is soft.  Musculoskeletal:        General: No swelling or tenderness.  Skin:    General: Skin is warm and dry.  Neurological:     General: No focal deficit present.     Mental Status: She is alert and oriented to person, place, and time.  Psychiatric:        Mood and Affect: Mood normal.        Behavior: Behavior normal.    Assessment & Plan:  1: Chronic heart failure with preserved ejection fraction- - NYHA class III - euvolemic today - set of scales given to her and she was instructed to weigh daily and call for an overnight weight gain of >2 pounds or a weekly weight gain of >5 pounds - not adding salt but hasn't been reading food  labels much. Reviewed the importance of closely following a 2000mg  sodium diet and written dietary information was given to her about this  - she says that she's  received her flu vaccine for this season - saw cardiology Rockey Situ) 12/12/17  2: HTN- - BP mildly elevated today - saw PCP (T[place]) 10/30/18 - BMP from 10/23/18 reviewed and showed sodium   3: ESRD- - dialysis on Tuesday and Saturday - saw vascular 10/16/18  Patient did not bring her medications nor a list. Each medication was verbally reviewed with the patient and she was encouraged to bring the bottles to every visit to confirm accuracy of list.  Return in 2 months or sooner for any questions/problems before then.

## 2018-11-02 ENCOUNTER — Encounter: Payer: Self-pay | Admitting: Family

## 2018-11-02 DIAGNOSIS — I10 Essential (primary) hypertension: Secondary | ICD-10-CM | POA: Insufficient documentation

## 2018-11-02 DIAGNOSIS — I5032 Chronic diastolic (congestive) heart failure: Secondary | ICD-10-CM | POA: Insufficient documentation

## 2018-11-28 ENCOUNTER — Inpatient Hospital Stay
Admission: EM | Admit: 2018-11-28 | Discharge: 2018-11-29 | DRG: 291 | Disposition: A | Discharge status: Home / Self Care | Payer: Medicare Other | Attending: Internal Medicine | Admitting: Internal Medicine

## 2018-11-28 ENCOUNTER — Emergency Department: Payer: Medicare Other

## 2018-11-28 DIAGNOSIS — I5033 Acute on chronic diastolic (congestive) heart failure: Secondary | ICD-10-CM | POA: Diagnosis present

## 2018-11-28 DIAGNOSIS — Z882 Allergy status to sulfonamides status: Secondary | ICD-10-CM

## 2018-11-28 DIAGNOSIS — A419 Sepsis, unspecified organism: Secondary | ICD-10-CM | POA: Diagnosis present

## 2018-11-28 DIAGNOSIS — D631 Anemia in chronic kidney disease: Secondary | ICD-10-CM | POA: Diagnosis present

## 2018-11-28 DIAGNOSIS — E785 Hyperlipidemia, unspecified: Secondary | ICD-10-CM | POA: Diagnosis present

## 2018-11-28 DIAGNOSIS — Z885 Allergy status to narcotic agent status: Secondary | ICD-10-CM | POA: Diagnosis not present

## 2018-11-28 DIAGNOSIS — E1122 Type 2 diabetes mellitus with diabetic chronic kidney disease: Secondary | ICD-10-CM | POA: Diagnosis present

## 2018-11-28 DIAGNOSIS — G4730 Sleep apnea, unspecified: Secondary | ICD-10-CM | POA: Diagnosis present

## 2018-11-28 DIAGNOSIS — N186 End stage renal disease: Secondary | ICD-10-CM | POA: Diagnosis present

## 2018-11-28 DIAGNOSIS — Z992 Dependence on renal dialysis: Secondary | ICD-10-CM

## 2018-11-28 DIAGNOSIS — R001 Bradycardia, unspecified: Secondary | ICD-10-CM | POA: Diagnosis not present

## 2018-11-28 DIAGNOSIS — J449 Chronic obstructive pulmonary disease, unspecified: Secondary | ICD-10-CM | POA: Diagnosis present

## 2018-11-28 DIAGNOSIS — K219 Gastro-esophageal reflux disease without esophagitis: Secondary | ICD-10-CM | POA: Diagnosis present

## 2018-11-28 DIAGNOSIS — I132 Hypertensive heart and chronic kidney disease with heart failure and with stage 5 chronic kidney disease, or end stage renal disease: Principal | ICD-10-CM | POA: Diagnosis present

## 2018-11-28 DIAGNOSIS — Z66 Do not resuscitate: Secondary | ICD-10-CM | POA: Diagnosis present

## 2018-11-28 DIAGNOSIS — Z888 Allergy status to other drugs, medicaments and biological substances status: Secondary | ICD-10-CM

## 2018-11-28 DIAGNOSIS — Z7951 Long term (current) use of inhaled steroids: Secondary | ICD-10-CM | POA: Diagnosis not present

## 2018-11-28 DIAGNOSIS — N2581 Secondary hyperparathyroidism of renal origin: Secondary | ICD-10-CM | POA: Diagnosis present

## 2018-11-28 DIAGNOSIS — I130 Hypertensive heart and chronic kidney disease with heart failure and stage 1 through stage 4 chronic kidney disease, or unspecified chronic kidney disease: Secondary | ICD-10-CM | POA: Diagnosis present

## 2018-11-28 DIAGNOSIS — Z7982 Long term (current) use of aspirin: Secondary | ICD-10-CM | POA: Diagnosis not present

## 2018-11-28 DIAGNOSIS — Z79899 Other long term (current) drug therapy: Secondary | ICD-10-CM

## 2018-11-28 DIAGNOSIS — R4182 Altered mental status, unspecified: Secondary | ICD-10-CM

## 2018-11-28 DIAGNOSIS — I10 Essential (primary) hypertension: Secondary | ICD-10-CM | POA: Diagnosis present

## 2018-11-28 DIAGNOSIS — E875 Hyperkalemia: Secondary | ICD-10-CM | POA: Diagnosis present

## 2018-11-28 DIAGNOSIS — E1169 Type 2 diabetes mellitus with other specified complication: Secondary | ICD-10-CM | POA: Diagnosis present

## 2018-11-28 LAB — CBC WITH DIFFERENTIAL/PLATELET
Abs Immature Granulocytes: 0.08 10*3/uL — ABNORMAL HIGH (ref 0.00–0.07)
Basophils Absolute: 0.1 10*3/uL (ref 0.0–0.1)
Basophils Relative: 0 %
Eosinophils Absolute: 0 10*3/uL (ref 0.0–0.5)
Eosinophils Relative: 0 %
HCT: 37.9 % (ref 36.0–46.0)
Hemoglobin: 12.2 g/dL (ref 12.0–15.0)
Immature Granulocytes: 1 %
Lymphocytes Relative: 5 %
Lymphs Abs: 0.6 10*3/uL — ABNORMAL LOW (ref 0.7–4.0)
MCH: 30.1 pg (ref 26.0–34.0)
MCHC: 32.2 g/dL (ref 30.0–36.0)
MCV: 93.6 fL (ref 80.0–100.0)
Monocytes Absolute: 0.3 10*3/uL (ref 0.1–1.0)
Monocytes Relative: 2 %
Neutro Abs: 11.3 10*3/uL — ABNORMAL HIGH (ref 1.7–7.7)
Neutrophils Relative %: 92 %
Platelets: 190 10*3/uL (ref 150–400)
RBC: 4.05 MIL/uL (ref 3.87–5.11)
RDW: 14.6 % (ref 11.5–15.5)
WBC: 12.4 10*3/uL — ABNORMAL HIGH (ref 4.0–10.5)
nRBC: 0 % (ref 0.0–0.2)

## 2018-11-28 LAB — URINALYSIS, COMPLETE (UACMP) WITH MICROSCOPIC
Bilirubin Urine: NEGATIVE
Glucose, UA: 50 mg/dL — AB
Hgb urine dipstick: NEGATIVE
Ketones, ur: 5 mg/dL — AB
Leukocytes, UA: NEGATIVE
Nitrite: NEGATIVE
Protein, ur: >=300 mg/dL — AB
Specific Gravity, Urine: 1.012 (ref 1.005–1.030)
Squamous Epithelial / LPF: NONE SEEN (ref 0–5)
pH: 8 (ref 5.0–8.0)

## 2018-11-28 LAB — TSH: TSH: 1.789 u[IU]/mL (ref 0.350–4.500)

## 2018-11-28 LAB — AMMONIA: Ammonia: 19 umol/L (ref 9–35)

## 2018-11-28 LAB — TROPONIN I: Troponin I: <0.03 ng/mL (ref <0.03)

## 2018-11-28 MED ORDER — CALCIUM GLUCONATE 10 % IV SOLN
1.0000 g | Freq: Once | INTRAVENOUS | Status: AC
  Administered 2018-11-28: 1 g via INTRAVENOUS
  Filled 2018-11-28: qty 10

## 2018-11-28 NOTE — ED Provider Notes (Addendum)
[facility] Emergency Department Provider Note  ____________________________________________   First MD Initiated Contact with Patient 11/28/18 2144     (approximate)  I have reviewed the triage vital signs and the nursing notes.   HISTORY  Chief Complaint Altered Mental Status   HPI AARADHYA KYSAR is a 73 y.o. female with a history of end-stage renal disease on dialysis, Tuesday and Saturday who presented emergency department with an altered mental status.  Patient brought in by EMS from her home.  EMS reports that she was found by her neighbor with altered mental status.  Unknown last normal but the patient is reportedly ambulatory and with a normal, functional mentation.  Patient unable to give further history at this time.  EMS reports blood sugar in the 200s.  EMS also reports patient's heart rate variable between the 30s in the 70s.  Past Medical History:  Diagnosis Date  . Allergy   . Anemia of chronic disease   . Anxiety   . Aortic atherosclerosis ([place])   . Asthma   . CHF (congestive heart failure) ([place])   . Chronic back pain   . COPD (chronic obstructive pulmonary disease) ([place])   . Diabetes mellitus with complication ([place])   . ESRD on hemodialysis ([place])    a. Tues/Sat; b. 2/2 small kidneys  . Essential hypertension   . Fistula    lower left arm  . GERD (gastroesophageal reflux disease)   . Gout   . History of echocardiogram    a. TTE 01/2014: nl LV sys fxn, no valvular abnormalities; b. TTE 11/16: nl EF, mild LVH  . History of exercise stress test    a. 01/2014: no evidence of ischemia; b. Lexiscan 08/2015: no sig ischemia, severe GI uptake artifact, low risk; c. CPET @ [facility] 09/2016: exercised 3 min 12 sec on bike without incline, 2.28 METs, VO2 of 8.1, 48% of predicted, indicating mod to sev functional impairment, evidence of blunted HR, stroke volume, and BP augmentation as well as ventilation-perfusion mismatch with exercise  . HLD  (hyperlipidemia)   . 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 Renal insufficiency 09/24/2017   Dialysis patient.  . Sleep apnea     Patient Active Problem List   Diagnosis Date Noted  . Chronic diastolic heart failure ([place]) 11/02/2018  . HTN (hypertension) 11/02/2018  . Accelerated hypertension 10/21/2018  . Flash pulmonary edema ([place]) 10/21/2018  . Pressure injury of skin 03/16/2018  . CVA (cerebral vascular accident) ([place]) 01/16/2018  . Aortic atherosclerosis ([place]) 12/11/2017  . Chest pain 09/18/2017  . Chest discomfort 08/06/2015  . Hyperlipidemia 08/06/2015  . Complication from renal dialysis device 04/12/2015  . SOB (shortness of breath) 02/01/2015  . End stage renal disease on dialysis ([place]) 02/01/2015  . Type 2 diabetes mellitus with other specified complication ([place]) 09/73/5329  . Asthma 02/01/2015  . Acute on chronic diastolic CHF (congestive heart failure) ([place]) 02/01/2015    Past Surgical History:  Procedure Laterality Date  . carpel tunnel    . GALLBLADDER SURGERY    . PERIPHERAL VASCULAR CATHETERIZATION N/A 04/12/2015   Procedure: A/V Shuntogram/Fistulagram;  Surgeon: Algernon Huxley, MD;  Location: [place] [facility];  Service: Cardiovascular;  Laterality: N/A;  . PERIPHERAL VASCULAR CATHETERIZATION N/A 04/12/2015   Procedure: A/V Shunt Intervention;  Surgeon: Algernon Huxley, MD;  Location: [place] [facility];  Service: Cardiovascular;  Laterality: N/A;  . PERIPHERAL VASCULAR CATHETERIZATION N/A 06/09/2015  Procedure: Dialysis/Perma Catheter Removal;  Surgeon: Katha Cabal, MD;  Location: [facility];  Service: Cardiovascular;  Laterality: N/A;    Prior to Admission medications   Medication Sig Start Date End Date Taking? Authorizing Provider  albuterol (PROVENTIL HFA;VENTOLIN HFA) 108 (90 Base) MCG/ACT inhaler Inhale 2 puffs into the lungs every 6 (six) hours as needed for wheezing or shortness of breath.    [provider]  amLODipine (NORVASC) 10 MG tablet Take 10 mg by mouth.  12/29/13   [provider]  aspirin EC 81 MG EC tablet Take 1 tablet (81 mg total) by mouth daily. 09/20/17   Bettey Costa, MD  calcium acetate (PHOSLO) 667 MG capsule Take 667 mg by mouth 3 (three) times daily with meals.     [provider]  carvedilol (COREG) 12.5 MG tablet Take 12.5 mg by mouth 2 (two) times daily with a meal.  01/09/14   [provider]  cetirizine (ZYRTEC) 10 MG tablet Take 10 mg by mouth daily.     [provider]  clopidogrel (PLAVIX) 75 MG tablet Take 1 tablet (75 mg total) by mouth daily. 01/18/18   Epifanio Lesches, MD  cyclobenzaprine (FLEXERIL) 10 MG tablet Take 10 mg by mouth daily as needed for muscle spasms.     [provider]  docusate sodium (COLACE) 100 MG capsule Take 100 mg by mouth 3 (three) times daily as needed for mild constipation.    [provider]  Fluticasone-Salmeterol (ADVAIR) 250-50 MCG/DOSE AEPB Inhale 1 puff into the lungs 2 (two) times daily as needed (for shortness of breath).     [provider]  furosemide (LASIX) 80 MG tablet Take 1 tablet (80 mg total) by mouth daily. 03/17/18   Loletha Grayer, MD  HYDROcodone-acetaminophen (NORCO) 7.5-325 MG tablet Take 1 tablet by mouth 2 (two) times daily as needed for moderate pain.     [provider]  lidocaine-prilocaine (EMLA) cream Apply 1 application topically as needed (prior to treatment).     [provider]  nitroGLYCERIN (NITROSTAT) 0.4 MG SL tablet Place 1 tablet (0.4 mg total) under the tongue every 5 (five) minutes as needed for chest pain. 09/21/17   Strader, Fransisco Hertz, PA-C  omeprazole (PRILOSEC) 20 MG capsule Take 20 mg by mouth 2 (two) times daily before a meal.    [provider]  ondansetron (ZOFRAN-ODT) 4 MG disintegrating tablet Take 4 mg by mouth 2 (two) times daily as needed for nausea or vomiting.     [provider]    oxybutynin (DITROPAN XL) 15 MG 24 hr tablet Take 15 mg by mouth daily. 12/24/17   [provider]  pravastatin (PRAVACHOL) 40 MG tablet Take 40 mg by mouth at bedtime.     [provider]  senna (SENOKOT) 8.6 MG tablet Take 1 tablet by mouth daily.    [provider]  topiramate (TOPAMAX) 25 MG tablet Take 25 mg by mouth daily as needed (for headaches).     [provider]    Allergies Codeine; Enalapril maleate; Nitrofurantoin; Sulfamethoxazole-trimethoprim; Vicodin [hydrocodone-acetaminophen]; 2,4-d dimethylamine (amisol); Baclofen; Neosporin [neomycin-bacitracin zn-polymyx]; Quinine; Ultram [tramadol]; Zocor [simvastatin]; Bactrim [sulfamethoxazole-trimethoprim]; Levodopa; Macrodantin [nitrofurantoin macrocrystal]; Quinine derivatives; and Vasotec [enalapril]  Family History  Problem Relation Age of Onset  . Hypertension Mother   . Hyperlipidemia Mother   . Heart disease Father   . Heart attack Father 38  . Hypertension Father   . Hyperlipidemia Father   . Heart  disease Brother        CABG   . Heart attack Brother   . Breast cancer Neg Hx     Social History Social History   Tobacco Use  . Smoking status: Never Smoker  . Smokeless tobacco: Never Used  Substance Use Topics  . Alcohol use: No  . Drug use: No    Review of Systems  Level 5 caveat secondary to altered mental status.  ____________________________________________   PHYSICAL EXAM:  VITAL SIGNS: ED Triage Vitals  Enc Vitals Group     BP 11/28/18 2200 (!) 160/69     Pulse Rate 11/28/18 2200 (!) 37     Resp 11/28/18 2200 17     Temp 11/28/18 2221 98.6 F (37 C)     Temp Source 11/28/18 2221 Rectal     SpO2 11/28/18 2200 (!) 84 %     Weight 11/28/18 2158 135 lb (61.2 kg)     Height 11/28/18 2158 5' (1.524 m)     Head Circumference --      Peak Flow --      Pain Score --      Pain Loc --      Pain Edu? --      Excl. in GC? --     Constitutional: GCS of 9.   Patient localizes to pain.  Groans with incomprehensible sounds.  Opens eyes to pain as well. Eyes: Conjunctivae are normal.  Pupils are PERRLA Head: Atraumatic. Nose: No congestion/rhinnorhea. Mouth/Throat: Mucous membranes are moist.  Neck: No stridor.   Cardiovascular: Irregularly irregular heart rate in the 40s.  No murmurs auscultated.  Palpable thrill to the left upper extremity fistula. Respiratory: Normal respiratory effort.  No retractions. Lungs CTAB. Gastrointestinal: Soft and nontender. No distention.  Musculoskeletal: No lower extremity tenderness nor edema.  No joint effusions. Neurologic: Does not appear to have any focal weakness.  Good tone in all 4 extremities.   Skin:  Skin is warm, dry and intact. No rash noted.  ____________________________________________   LABS (all labs ordered are listed, but only abnormal results are displayed)  Labs Reviewed  CBC WITH DIFFERENTIAL/PLATELET - Abnormal; Notable for the following components:      Result Value   WBC 12.4 (*)    Neutro Abs 11.3 (*)    Lymphs Abs 0.6 (*)    Abs Immature Granulocytes 0.08 (*)    All other components within normal limits  URINALYSIS, COMPLETE (UACMP) WITH MICROSCOPIC - Abnormal; Notable for the following components:   Color, Urine YELLOW (*)    APPearance CLEAR (*)    Glucose, UA 50 (*)    Ketones, ur 5 (*)    Protein, ur >=300 (*)    Bacteria, UA RARE (*)    All other components within normal limits  CULTURE, BLOOD (ROUTINE X 2)  CULTURE, BLOOD (ROUTINE X 2)  URINE CULTURE  AMMONIA  CBC WITH DIFFERENTIAL/PLATELET  COMPREHENSIVE METABOLIC PANEL  TROPONIN I  TSH  BRAIN NATRIURETIC PEPTIDE  I-STAT CG4 LACTIC ACID, ED  I-STAT CG4 LACTIC ACID, ED   ____________________________________________  EKG  ED ECG REPORT I, Doran Stabler, the attending physician, personally viewed and interpreted this ECG.   Date: 11/29/2018  EKG Time: 2208  Rate: 94  Rhythm: Exhilarated junctional  rhythm  Axis: Normal  Intervals:nonspecific intraventricular conduction delay  ST&T Change: No ST segment elevation or depression.  No abnormal T wave inversion   ED ECG REPORT I, Doran Stabler, the attending physician,  personally viewed and interpreted this ECG.   Date: 11/28/2018  EKG Time: 2209  Rate: 36  Rhythm: Appears to be in possible heart block versus junctional escape rhythm.  Possible A. fib as well with slow response  Axis: Normal  Intervals: Narrow complex without interval abnormality  ST&T Change: No ST segment elevation or depression.  No abnormal T wave inversion.  ED ECG REPORT I, Doran Stabler, the attending physician, personally viewed and interpreted this ECG.   Date: 11/29/2018  EKG Time: 2156  Rate: 46  Rhythm: Junctional rhythm  Axis: Normal  Intervals:none  ST&T Change: No ST segment elevation or depression.  No abnormal T wave inversion.  ____________________________________________  RADIOLOGY  Pelvic x-ray as well as chest x-ray without acute osseous abnormality.  Chest x-ray with cardiomegaly with vascular congestion and mild groundglass opacities suspicious for mild edema. ____________________________________________   PROCEDURES  Procedure(s) performed:   .Critical Care Performed by: Orbie Pyo, MD Authorized by: Orbie Pyo, MD   Critical care provider statement:    Critical care time (minutes):  35   Critical care time was exclusive of:  Separately billable procedures and treating other patients   Critical care was necessary to treat or prevent imminent or life-threatening deterioration of the following conditions:  CNS failure or compromise and circulatory failure   Critical care was time spent personally by me on the following activities:  Development of treatment plan with patient or surrogate, discussions with consultants, evaluation of patient's response to treatment, examination of patient,  obtaining history from patient or surrogate, ordering and performing treatments and interventions, ordering and review of laboratory studies, ordering and review of radiographic studies, pulse oximetry, re-evaluation of patient's condition and review of old charts    Critical Care performed:    ____________________________________________   INITIAL IMPRESSION / ASSESSMENT AND PLAN / ED COURSE  Pertinent labs & imaging results that were available during my care of the patient were reviewed by me and considered in my medical decision making (see chart for details).  Differential diagnosis includes, but is not limited to, alcohol, illicit or prescription medications, or other toxic ingestion; intracranial pathology such as stroke or intracerebral hemorrhage; fever or infectious causes including sepsis; hypoxemia and/or hypercarbia; uremia; trauma; endocrine related disorders such as diabetes, hypoglycemia, and thyroid-related diseases; hypertensive encephalopathy; etc. As part of my medical decision making, I reviewed the following data within the electronic MEDICAL RECORD NUMBER Notes from prior ED visits  ----------------------------------------- 11:43 PM on 11/28/2018 -----------------------------------------  Patient at this time still bradycardic but with perfusing blood pressure.  Pending CT results.  However, I reviewed the scan of the brain myself and there does not appear to be any acute hemorrhage.  Pending metabolic panel as well as urinalysis.  Patient will require admission for altered mental status in addition to bradycardia.  Given calcium gluconate as the patient is a renal patient hyperkalemia is also on the differential.  However, reassuring T wave as well as narrow complex rhythm.  Also on a calcium channel blocker as well as a beta-blocker.  Rate variable in the emergency department from the 40s in the 90s.  EKG is without P waves.  Signed out to Dr.  Owens Shark. ____________________________________________   FINAL CLINICAL IMPRESSION(S) / ED DIAGNOSES  Altered mental status.  Bradycardia.  NEW MEDICATIONS STARTED DURING THIS VISIT:  New Prescriptions   No medications on file     Note:  This document was prepared using Dragon voice recognition software and  may include unintentional dictation errors.     Orbie Pyo, MD 11/28/18 2345    Clearnce Hasten Randall An, MD 11/29/18 Dyann Kief

## 2018-11-28 NOTE — ED Triage Notes (Signed)
Pt was found on the floor by her room mate. The neighbor came over and called EMS. He told EMS that pt was not acting like herself. Pt is a dialysis pt but unsure when her last appt is. On arrival to ED pt is not answering questions, when her name is called she will open her eyes and mumble but nothing can be understood.

## 2018-11-28 NOTE — ED Notes (Signed)
Pt is going to medical imaging.   

## 2018-11-29 DIAGNOSIS — G4730 Sleep apnea, unspecified: Secondary | ICD-10-CM | POA: Diagnosis not present

## 2018-11-29 DIAGNOSIS — J449 Chronic obstructive pulmonary disease, unspecified: Secondary | ICD-10-CM | POA: Diagnosis not present

## 2018-11-29 DIAGNOSIS — I130 Hypertensive heart and chronic kidney disease with heart failure and stage 1 through stage 4 chronic kidney disease, or unspecified chronic kidney disease: Secondary | ICD-10-CM | POA: Diagnosis present

## 2018-11-29 DIAGNOSIS — Z79899 Other long term (current) drug therapy: Secondary | ICD-10-CM | POA: Diagnosis not present

## 2018-11-29 DIAGNOSIS — N2581 Secondary hyperparathyroidism of renal origin: Secondary | ICD-10-CM | POA: Diagnosis not present

## 2018-11-29 DIAGNOSIS — E785 Hyperlipidemia, unspecified: Secondary | ICD-10-CM | POA: Diagnosis not present

## 2018-11-29 DIAGNOSIS — A419 Sepsis, unspecified organism: Secondary | ICD-10-CM | POA: Diagnosis present

## 2018-11-29 DIAGNOSIS — N186 End stage renal disease: Secondary | ICD-10-CM | POA: Diagnosis not present

## 2018-11-29 DIAGNOSIS — Z992 Dependence on renal dialysis: Secondary | ICD-10-CM | POA: Diagnosis not present

## 2018-11-29 DIAGNOSIS — I5033 Acute on chronic diastolic (congestive) heart failure: Secondary | ICD-10-CM | POA: Diagnosis not present

## 2018-11-29 DIAGNOSIS — I132 Hypertensive heart and chronic kidney disease with heart failure and with stage 5 chronic kidney disease, or end stage renal disease: Secondary | ICD-10-CM | POA: Diagnosis not present

## 2018-11-29 DIAGNOSIS — E875 Hyperkalemia: Secondary | ICD-10-CM | POA: Diagnosis present

## 2018-11-29 DIAGNOSIS — Z7982 Long term (current) use of aspirin: Secondary | ICD-10-CM | POA: Diagnosis not present

## 2018-11-29 DIAGNOSIS — Z66 Do not resuscitate: Secondary | ICD-10-CM | POA: Diagnosis not present

## 2018-11-29 DIAGNOSIS — D631 Anemia in chronic kidney disease: Secondary | ICD-10-CM | POA: Diagnosis not present

## 2018-11-29 DIAGNOSIS — Z7951 Long term (current) use of inhaled steroids: Secondary | ICD-10-CM | POA: Diagnosis not present

## 2018-11-29 DIAGNOSIS — Z885 Allergy status to narcotic agent status: Secondary | ICD-10-CM | POA: Diagnosis not present

## 2018-11-29 DIAGNOSIS — R001 Bradycardia, unspecified: Secondary | ICD-10-CM | POA: Diagnosis not present

## 2018-11-29 DIAGNOSIS — E1122 Type 2 diabetes mellitus with diabetic chronic kidney disease: Secondary | ICD-10-CM | POA: Diagnosis not present

## 2018-11-29 DIAGNOSIS — K219 Gastro-esophageal reflux disease without esophagitis: Secondary | ICD-10-CM | POA: Diagnosis not present

## 2018-11-29 DIAGNOSIS — Z888 Allergy status to other drugs, medicaments and biological substances status: Secondary | ICD-10-CM | POA: Diagnosis not present

## 2018-11-29 DIAGNOSIS — Z882 Allergy status to sulfonamides status: Secondary | ICD-10-CM | POA: Diagnosis not present

## 2018-11-29 LAB — CG4 I-STAT (LACTIC ACID): Lactic Acid, Venous: 1.54 mmol/L (ref 0.5–1.9)

## 2018-11-29 LAB — CK: Total CK: 38 U/L (ref 38–234)

## 2018-11-29 LAB — CBC
HCT: 34.5 % — ABNORMAL LOW (ref 36.0–46.0)
Hemoglobin: 10.8 g/dL — ABNORMAL LOW (ref 12.0–15.0)
MCH: 29.5 pg (ref 26.0–34.0)
MCHC: 31.3 g/dL (ref 30.0–36.0)
MCV: 94.3 fL (ref 80.0–100.0)
Platelets: 158 10*3/uL (ref 150–400)
RBC: 3.66 MIL/uL — ABNORMAL LOW (ref 3.87–5.11)
RDW: 14.5 % (ref 11.5–15.5)
WBC: 9 10*3/uL (ref 4.0–10.5)
nRBC: 0 % (ref 0.0–0.2)

## 2018-11-29 LAB — COMPREHENSIVE METABOLIC PANEL
ALT: 12 U/L (ref 0–44)
AST: 17 U/L (ref 15–41)
Albumin: 4.2 g/dL (ref 3.5–5.0)
Alkaline Phosphatase: 82 U/L (ref 38–126)
Anion gap: 13 (ref 5–15)
BUN: 44 mg/dL — ABNORMAL HIGH (ref 8–23)
CO2: 24 mmol/L (ref 22–32)
Calcium: 8.7 mg/dL — ABNORMAL LOW (ref 8.9–10.3)
Chloride: 100 mmol/L (ref 98–111)
Creatinine, Ser: 6.85 mg/dL — ABNORMAL HIGH (ref 0.44–1.00)
GFR calc Af Amer: 6 mL/min — ABNORMAL LOW (ref >60)
GFR calc non Af Amer: 5 mL/min — ABNORMAL LOW (ref >60)
Glucose, Bld: 199 mg/dL — ABNORMAL HIGH (ref 70–99)
Potassium: 7.5 mmol/L (ref 3.5–5.1)
Sodium: 137 mmol/L (ref 135–145)
Total Bilirubin: 0.9 mg/dL (ref 0.3–1.2)
Total Protein: 6.7 g/dL (ref 6.5–8.1)

## 2018-11-29 LAB — BASIC METABOLIC PANEL
Anion gap: 10 (ref 5–15)
BUN: 18 mg/dL (ref 8–23)
CO2: 29 mmol/L (ref 22–32)
Calcium: 9.1 mg/dL (ref 8.9–10.3)
Chloride: 100 mmol/L (ref 98–111)
Creatinine, Ser: 3.86 mg/dL — ABNORMAL HIGH (ref 0.44–1.00)
GFR calc Af Amer: 13 mL/min — ABNORMAL LOW (ref >60)
GFR calc non Af Amer: 11 mL/min — ABNORMAL LOW (ref >60)
Glucose, Bld: 136 mg/dL — ABNORMAL HIGH (ref 70–99)
Potassium: 4.2 mmol/L (ref 3.5–5.1)
Sodium: 139 mmol/L (ref 135–145)

## 2018-11-29 LAB — BRAIN NATRIURETIC PEPTIDE: B Natriuretic Peptide: 1283 pg/mL — ABNORMAL HIGH (ref 0.0–100.0)

## 2018-11-29 LAB — GLUCOSE, CAPILLARY: Glucose-Capillary: 117 mg/dL — ABNORMAL HIGH (ref 70–99)

## 2018-11-29 MED ORDER — AMLODIPINE BESYLATE 5 MG PO TABS
10.0000 mg | ORAL_TABLET | Freq: Every day | ORAL | Status: DC
  Administered 2018-11-29: 10 mg via ORAL
  Filled 2018-11-29: qty 2

## 2018-11-29 MED ORDER — DEXTROSE 50 % IV SOLN
25.0000 g | Freq: Once | INTRAVENOUS | Status: AC
  Administered 2018-11-29: 25 g via INTRAVENOUS

## 2018-11-29 MED ORDER — INSULIN ASPART 100 UNIT/ML ~~LOC~~ SOLN
6.0000 [IU] | Freq: Once | SUBCUTANEOUS | Status: AC
  Administered 2018-11-29: 6 [IU] via INTRAVENOUS

## 2018-11-29 MED ORDER — ALBUTEROL SULFATE (2.5 MG/3ML) 0.083% IN NEBU
15.0000 mg | INHALATION_SOLUTION | Freq: Once | RESPIRATORY_TRACT | Status: AC
  Administered 2018-11-29: 15 mg via RESPIRATORY_TRACT

## 2018-11-29 MED ORDER — SODIUM BICARBONATE 8.4 % IV SOLN
100.0000 meq | Freq: Once | INTRAVENOUS | Status: AC
  Administered 2018-11-29: 100 meq via INTRAVENOUS

## 2018-11-29 MED ORDER — ACETAMINOPHEN 325 MG PO TABS: 650.0000 mg | ORAL_TABLET | Freq: Four times a day (QID) | ORAL | Status: DC | PRN

## 2018-11-29 MED ORDER — CALCIUM GLUCONATE 10 % IV SOLN
INTRAVENOUS | Status: AC
  Filled 2018-11-29: qty 10

## 2018-11-29 MED ORDER — ACETAMINOPHEN 650 MG RE SUPP: 650.0000 mg | Freq: Four times a day (QID) | RECTAL | Status: DC | PRN

## 2018-11-29 MED ORDER — ONDANSETRON HCL 4 MG/2ML IJ SOLN: 4.0000 mg | Freq: Four times a day (QID) | INTRAMUSCULAR | Status: DC | PRN

## 2018-11-29 MED ORDER — INSULIN ASPART 100 UNIT/ML ~~LOC~~ SOLN
SUBCUTANEOUS | Status: AC
  Administered 2018-11-29: 6 [IU] via INTRAVENOUS
  Filled 2018-11-29: qty 1

## 2018-11-29 MED ORDER — ONDANSETRON HCL 4 MG/2ML IJ SOLN
INTRAMUSCULAR | Status: AC
  Administered 2018-11-29: 4 mg via INTRAVENOUS
  Filled 2018-11-29: qty 2

## 2018-11-29 MED ORDER — CHLORHEXIDINE GLUCONATE CLOTH 2 % EX PADS
6.0000 | MEDICATED_PAD | Freq: Every day | CUTANEOUS | Status: DC
  Filled 2018-11-29: qty 6

## 2018-11-29 MED ORDER — CARVEDILOL 6.25 MG PO TABS
12.5000 mg | ORAL_TABLET | Freq: Two times a day (BID) | ORAL | Status: DC
  Administered 2018-11-29 (×2): 12.5 mg via ORAL
  Filled 2018-11-29 (×2): qty 2

## 2018-11-29 MED ORDER — PRAVASTATIN SODIUM 40 MG PO TABS
40.0000 mg | ORAL_TABLET | Freq: Every day | ORAL | Status: DC
  Filled 2018-11-29: qty 1

## 2018-11-29 MED ORDER — HEPARIN SODIUM (PORCINE) 1000 UNIT/ML DIALYSIS: 20.0000 [IU]/kg | INTRAMUSCULAR | Status: DC | PRN

## 2018-11-29 MED ORDER — INSULIN ASPART 100 UNIT/ML ~~LOC~~ SOLN: 0.0000 [IU] | Freq: Four times a day (QID) | SUBCUTANEOUS | Status: DC

## 2018-11-29 MED ORDER — CLOPIDOGREL BISULFATE 75 MG PO TABS
75.0000 mg | ORAL_TABLET | Freq: Every day | ORAL | Status: DC
  Administered 2018-11-29: 75 mg via ORAL
  Filled 2018-11-29: qty 1

## 2018-11-29 MED ORDER — ASPIRIN EC 81 MG PO TBEC
81.0000 mg | DELAYED_RELEASE_TABLET | Freq: Every day | ORAL | Status: DC
  Administered 2018-11-29: 81 mg via ORAL
  Filled 2018-11-29: qty 1

## 2018-11-29 MED ORDER — MOMETASONE FURO-FORMOTEROL FUM 200-5 MCG/ACT IN AERO
2.0000 | INHALATION_SPRAY | Freq: Two times a day (BID) | RESPIRATORY_TRACT | Status: DC
  Filled 2018-11-29: qty 8.8

## 2018-11-29 MED ORDER — PATIROMER SORBITEX CALCIUM 8.4 G PO PACK
16.8000 g | PACK | Freq: Every day | ORAL | Status: DC
  Administered 2018-11-29: 16.8 g via ORAL
  Filled 2018-11-29: qty 2

## 2018-11-29 MED ORDER — CALCIUM ACETATE (PHOS BINDER) 667 MG PO CAPS
667.0000 mg | ORAL_CAPSULE | Freq: Three times a day (TID) | ORAL | Status: DC
  Administered 2018-11-29 (×2): 667 mg via ORAL
  Filled 2018-11-29 (×3): qty 1

## 2018-11-29 MED ORDER — FUROSEMIDE 40 MG PO TABS
80.0000 mg | ORAL_TABLET | Freq: Every day | ORAL | Status: DC
  Administered 2018-11-29: 80 mg via ORAL
  Filled 2018-11-29: qty 2

## 2018-11-29 MED ORDER — CALCIUM GLUCONATE 10 % IV SOLN
1.0000 g | Freq: Once | INTRAVENOUS | Status: AC
  Administered 2018-11-29: 1 g via INTRAVENOUS

## 2018-11-29 MED ORDER — ONDANSETRON HCL 4 MG/2ML IJ SOLN
4.0000 mg | Freq: Once | INTRAMUSCULAR | Status: AC
  Administered 2018-11-29: 4 mg via INTRAVENOUS

## 2018-11-29 MED ORDER — ONDANSETRON HCL 4 MG PO TABS: 4.0000 mg | ORAL_TABLET | Freq: Four times a day (QID) | ORAL | Status: DC | PRN

## 2018-11-29 MED ORDER — PANTOPRAZOLE SODIUM 20 MG PO TBEC
20.0000 mg | DELAYED_RELEASE_TABLET | Freq: Two times a day (BID) | ORAL | Status: DC
  Filled 2018-11-29: qty 1

## 2018-11-29 MED ORDER — HEPARIN SODIUM (PORCINE) 5000 UNIT/ML IJ SOLN
5000.0000 [IU] | Freq: Three times a day (TID) | INTRAMUSCULAR | Status: DC
  Administered 2018-11-29: 5000 [IU] via SUBCUTANEOUS
  Filled 2018-11-29 (×2): qty 1

## 2018-11-29 MED ORDER — ALBUTEROL SULFATE (2.5 MG/3ML) 0.083% IN NEBU
INHALATION_SOLUTION | RESPIRATORY_TRACT | Status: AC
  Administered 2018-11-29: 15 mg via RESPIRATORY_TRACT
  Filled 2018-11-29: qty 18

## 2018-11-29 NOTE — Care Management (Signed)
Notified Amanda Morrison, Dialysis liaison with patient pathways of admission.   

## 2018-11-29 NOTE — Progress Notes (Signed)
[place] [facility] [place] NAME: Lucienne Sawyers    MR#:  [id number]  DATE OF BIRTH:  1946/11/18  SUBJECTIVE: Admitted for altered mental status and found to have severe hyperkalemia, received emergency hemodialysis.  Admitted because of severe hyperkalemia and also found to have bradycardia.  CHIEF COMPLAINT:   Chief Complaint  Patient presents with  . Altered Mental Status  She is now alert, awake, oriented, potassium is 4.2.  She denies any complaints, bradycardia resolved, no hypoxia, alert, awake, oriented and wants to go home.  REVIEW OF SYSTEMS:   ROS CONSTITUTIONAL: No fever, fatigue or weakness.  EYES: No blurred or double vision.  EARS, NOSE, AND THROAT: No tinnitus or ear pain.  RESPIRATORY: No cough, shortness of breath, wheezing or hemoptysis.  CARDIOVASCULAR: No chest pain, orthopnea, edema.  GASTROINTESTINAL: No nausea, vomiting, diarrhea or abdominal pain.  GENITOURINARY: No dysuria, hematuria.  ENDOCRINE: No polyuria, nocturia,  HEMATOLOGY: No anemia, easy bruising or bleeding SKIN: No rash or lesion. MUSCULOSKELETAL: No joint pain or arthritis.   NEUROLOGIC: No tingling, numbness, weakness.  PSYCHIATRY: No anxiety or depression.   DRUG ALLERGIES:   Allergies  Allergen Reactions  . Codeine Nausea And Vomiting and Nausea Only  . Enalapril Maleate     Other reaction(s): Headache  . Nitrofurantoin Swelling and Rash    Other Reaction: swelling of body  . Sulfamethoxazole-Trimethoprim Swelling  . Vicodin [Hydrocodone-Acetaminophen] Nausea And Vomiting and Nausea Only  . 2,4-D Dimethylamine (Amisol) Rash    Other Reaction: h/a  . Baclofen Other (See Comments) and Nausea Only    lightheadness ,drowsiness , muscle weakness , twitching in hands   . Neosporin [Neomycin-Bacitracin Zn-Polymyx] Other (See Comments) and Rash    Other Reaction: irritation Skin irritation   . Quinine Rash    Other Reaction: Vomiting  rash, h/a, vision  . Ultram [Tramadol] Palpitations  . Zocor [Simvastatin] Other (See Comments) and Rash    Other Reaction: muscle spasms Muscle pain and spasms  . Bactrim [Sulfamethoxazole-Trimethoprim] Swelling  . Levodopa   . Macrodantin [Nitrofurantoin Macrocrystal] Swelling  . Quinine Derivatives Other (See Comments)    Vertigo,nausea vomiting blurred vision headache ears sensitivity   . Vasotec [Enalapril] Other (See Comments)    Headaches     VITALS:  Blood pressure (!) 156/65, pulse 71, temperature 98.2 F (36.8 C), temperature source Oral, resp. rate (!) 23, height 5' (1.524 m), weight 60 kg, SpO2 97 %.  PHYSICAL EXAMINATION:  GENERAL:  73 y.o.-year-old patient lying in the bed with no acute distress.  EYES: Pupils equal, round, reactive to light and accommodation. No scleral icterus. Extraocular muscles intact.  HEENT: Head atraumatic, normocephalic. Oropharynx and nasopharynx clear.  NECK:  Supple, no jugular venous distention. No thyroid enlargement, no tenderness.  LUNGS: Normal breath sounds bilaterally, no wheezing, rales,rhonchi or crepitation. No use of accessory muscles of respiration.  CARDIOVASCULAR: S1, S2 normal. No murmurs, rubs, or gallops.  ABDOMEN: Soft, nontender, nondistended. Bowel sounds present. No organomegaly or mass.  EXTREMITIES: No pedal edema, cyanosis, or clubbing.  NEUROLOGIC: Cranial nerves II through XII are intact. Muscle strength 5/5 in all extremities. Sensation intact. Gait not checked.  PSYCHIATRIC: The patient is alert and oriented x 3.  SKIN: No obvious rash, lesion, or ulcer.    LABORATORY PANEL:   CBC Recent Labs  Lab 11/29/18 0557  WBC 9.0  HGB 10.8*  HCT 34.5*  PLT 158   ------------------------------------------------------------------------------------------------------------------  Chemistries  Recent Labs  Lab 11/28/18 2147 11/29/18 0557  NA 137 139  K 7.5* 4.2  CL 100 100  CO2 24 29  GLUCOSE 199* 136*   BUN 44* 18  CREATININE 6.85* 3.86*  CALCIUM 8.7* 9.1  AST 17  --   ALT 12  --   ALKPHOS 82  --   BILITOT 0.9  --    ------------------------------------------------------------------------------------------------------------------  Cardiac Enzymes Recent Labs  Lab 11/28/18 2147  TROPONINI <0.03   ------------------------------------------------------------------------------------------------------------------  RADIOLOGY:  Dg Pelvis 1-2 Views  Result Date: 11/28/2018 CLINICAL DATA:  Fall EXAM: PELVIS - 1-2 VIEW COMPARISON:  CP 02/26/2007 FINDINGS: SI joints are non widened. Pubic symphysis and rami are intact. No acute displaced fracture or malalignment. Vascular calcifications. IMPRESSION: No acute osseous abnormality. Electronically Signed   By: Donavan Foil M.D.   On: 11/28/2018 23:21   Ct Head Wo Contrast  Result Date: 11/28/2018 CLINICAL DATA:  73 y/o  F; found down. EXAM: CT HEAD WITHOUT CONTRAST CT CERVICAL SPINE WITHOUT CONTRAST TECHNIQUE: Multidetector CT imaging of the head and cervical spine was performed following the standard protocol without intravenous contrast. Multiplanar CT image reconstructions of the cervical spine were also generated. COMPARISON:  08/16/2017 CT head and cervical spine. 01/17/2018 MRI head. FINDINGS: CT HEAD FINDINGS Brain: No evidence of acute infarction, hemorrhage, hydrocephalus, extra-axial collection or mass lesion/mass effect. Multiple chronic lacunar infarcts are present within bilateral thalami, bilateral lentiform nuclei, and the left caudate head. Nonspecific white matter hypodensities are compatible with chronic microvascular ischemic changes, stable from prior MRI given differences in technique. Stable volume loss of the brain. Vascular: Calcific atherosclerosis of carotid siphons. No hyperdense vessel identified. Skull: Normal. Negative for fracture or focal lesion. Sinuses/Orbits: Chronic left sphenoid sinus opacification calcified  inspissated secretions. Chronic inflammatory changes of the walls of the left sphenoid sinus. Additional visible paranasal sinuses and the mastoid air cells are normally aerated. High-riding right jugular bulb with thin sigmoid plate. Other: None. CT CERVICAL SPINE FINDINGS Alignment: Normal. Skull base and vertebrae: No acute fracture. No primary bone lesion or focal pathologic process. Soft tissues and spinal canal: No prevertebral fluid or swelling. No visible canal hematoma. Disc levels: Mild multilevel discogenic degenerative changes with loss of intervertebral disc space height. Predominant left-sided facet arthropathy. No significant bony foraminal or canal stenosis. Upper chest: Negative. Other: Calcific atherosclerosis of the bilateral carotid systems. IMPRESSION: 1. No acute intracranial abnormality identified. 2. Stable chronic microvascular ischemic changes, chronic lacunar infarcts, and volume loss of the brain. 3. No acute fracture or dislocation of the cervical spine. Electronically Signed   By: Kristine Garbe M.D.   On: 11/28/2018 23:48   Ct Cervical Spine Wo Contrast  Result Date: 11/28/2018 CLINICAL DATA:  73 y/o  F; found down. EXAM: CT HEAD WITHOUT CONTRAST CT CERVICAL SPINE WITHOUT CONTRAST TECHNIQUE: Multidetector CT imaging of the head and cervical spine was performed following the standard protocol without intravenous contrast. Multiplanar CT image reconstructions of the cervical spine were also generated. COMPARISON:  08/16/2017 CT head and cervical spine. 01/17/2018 MRI head. FINDINGS: CT HEAD FINDINGS Brain: No evidence of acute infarction, hemorrhage, hydrocephalus, extra-axial collection or mass lesion/mass effect. Multiple chronic lacunar infarcts are present within bilateral thalami, bilateral lentiform nuclei, and the left caudate head. Nonspecific white matter hypodensities are compatible with chronic microvascular ischemic changes, stable from prior MRI given  differences in technique. Stable volume loss of the brain. Vascular: Calcific atherosclerosis of carotid siphons. No hyperdense vessel identified. Skull: Normal. Negative for fracture or focal  lesion. Sinuses/Orbits: Chronic left sphenoid sinus opacification calcified inspissated secretions. Chronic inflammatory changes of the walls of the left sphenoid sinus. Additional visible paranasal sinuses and the mastoid air cells are normally aerated. High-riding right jugular bulb with thin sigmoid plate. Other: None. CT CERVICAL SPINE FINDINGS Alignment: Normal. Skull base and vertebrae: No acute fracture. No primary bone lesion or focal pathologic process. Soft tissues and spinal canal: No prevertebral fluid or swelling. No visible canal hematoma. Disc levels: Mild multilevel discogenic degenerative changes with loss of intervertebral disc space height. Predominant left-sided facet arthropathy. No significant bony foraminal or canal stenosis. Upper chest: Negative. Other: Calcific atherosclerosis of the bilateral carotid systems. IMPRESSION: 1. No acute intracranial abnormality identified. 2. Stable chronic microvascular ischemic changes, chronic lacunar infarcts, and volume loss of the brain. 3. No acute fracture or dislocation of the cervical spine. Electronically Signed   By: Kristine Garbe M.D.   On: 11/28/2018 23:48   Dg Chest Port 1 View  Result Date: 11/28/2018 CLINICAL DATA:  Altered mental status EXAM: PORTABLE CHEST 1 VIEW COMPARISON:  10/22/2018, 10/21/2018 FINDINGS: Low lung volumes. Cardiomegaly with vascular congestion and mild bilateral ground-glass opacities suspect for mild edema. Aortic atherosclerosis. No pneumothorax. IMPRESSION: Cardiomegaly with vascular congestion and mild ground-glass opacities suspicious for mild edema Electronically Signed   By: Donavan Foil M.D.   On: 11/28/2018 23:19    EKG:   Orders placed or performed during the hospital encounter of 11/28/18  . ED EKG  12-Lead  . ED EKG  . ED EKG 12-Lead  . ED EKG  . EKG 12-Lead  . EKG 12-Lead    ASSESSMENT AND PLAN:   Altered mental status secondary to life-threatening hyperkalemia, patient also had bradycardia, potassium was 7.5 initially decreased to 4.2 after dialysis.  She is now very alert, awake, oriented, family is at the bedside.  Patient is a ESRD patient on hemodialysis 2 times a week but according to Dr. Candiss Norse needs hemodialysis 3 times a week from now, patient will get dialysis Tuesday, Thursday, Saturday, will be evaluated tomorrow morning for further need for dialysis.  Dietary noncompliance. 2.  Severe hyperkalemia improved 3.  Secondary hyperparathyroidism: Stable  #4 diabetes mellitus type 2 5.  Hyperlipidemia  Start the patient on diabetic renal diet.  More than 50% time spent in counseling, coordination of care All the records are reviewed and case discussed with Care Management/Social Workerr. Management plans discussed with the patient, family and they are in agreement.  CODE STATUS: Full code  TOTAL TIME TAKING CARE OF THIS PATIENT: 38 minutes.   POSSIBLE D/C IN 1-2 DAYS, DEPENDING ON CLINICAL CONDITION.   Epifanio Lesches M.D on 11/29/2018 at 2:15 PM  Between 7am to 6pm - Pager - [phone]  After 6pm go to [email or web address] - password [email or web address] [facility]  Office  [phone]  CC: Primary care physician; [name], MD   Note: This dictation was prepared with Dragon dictation along with smaller phrase technology. Any transcriptional errors that result from this process are unintentional.

## 2018-11-29 NOTE — ED Notes (Signed)
Pt is with dialysis nurse.

## 2018-11-29 NOTE — Progress Notes (Signed)
CODE SEPSIS - PHARMACY COMMUNICATION  **Broad Spectrum Antibiotics should be administered within 1 hour of Sepsis diagnosis**  Time Code Sepsis Called/Page Received: 7253 2146  Antibiotics Ordered: n/a. No infection in H&P.  Time of 1st antibiotic administration:   Additional action taken by pharmacy:   If necessary, Name of Provider/Nurse Contacted:     Eloise Harman ,PharmD Clinical Pharmacist  11/29/2018  3:03 AM

## 2018-11-29 NOTE — Progress Notes (Signed)
Post HD assessment. Pt tolerated tx well without c/o or complication. Net UF 2009, goal met.    11/29/18 0503  Vital Signs  Temp 98.2 F (36.8 C)  Temp Source Oral  Pulse Rate 76  Pulse Rate Source Monitor  Resp 19  BP (!) 151/50  BP Location Right Arm  BP Method Automatic  Patient Position (if appropriate) Lying  Oxygen Therapy  SpO2 97 %  O2 Device Room Air  Dialysis Weight  Weight 60 kg  Type of Weight Post-Dialysis  Post-Hemodialysis Assessment  Rinseback Volume (mL) 250 mL  KECN 42.1 V  Dialyzer Clearance Lightly streaked  Duration of HD Treatment -hour(s) 2.5 hour(s)  Hemodialysis Intake (mL) 500 mL  UF Total -Machine (mL) 2509 mL  Net UF (mL) 2009 mL  Tolerated HD Treatment Yes  AVG/AVF Arterial Site Held (minutes) 10 minutes  AVG/AVF Venous Site Held (minutes) 10 minutes  Education / Care Plan  Dialysis Education Provided Yes  Documented Education in Care Plan Yes

## 2018-11-29 NOTE — Progress Notes (Signed)
Case d/w Dr Owens Shark Patient presents with K > 7.5 with bradycardia Emergent HD requested No ICU beds available Pt to dialyze in ER Orders placed and dialysis nurse alerted

## 2018-11-29 NOTE — H&P (Signed)
[facility] NAME: Phyllis [name]    MR#:  [id number]  DATE OF BIRTH:  1945-12-15  DATE OF ADMISSION:  11/28/2018  PRIMARY CARE PHYSICIAN: [name], MD   REQUESTING/REFERRING PHYSICIAN: Owens Shark, MD  CHIEF COMPLAINT:   Chief Complaint  Patient presents with  . Altered Mental Status    HISTORY OF PRESENT ILLNESS:  Phyllis [name]  is a 73 y.o. female who presents with chief complaint as above.  Patient presents to the ED referred by her roommate for altered mental status.  She is unable to contribute information to HPI tonight.  On evaluation here in the ED she meets sepsis criteria, though source is not immediately elucidated.  She is a dialysis patient and her potassium is 7.5.  Nephrology was contacted by ED physician for emergent dialysis.  Hospitalist were called for admission  PAST MEDICAL HISTORY:   Past Medical History:  Diagnosis Date  . Allergy   . Anemia of chronic disease   . Anxiety   . Aortic atherosclerosis ([place])   . Asthma   . CHF (congestive heart failure) ([place])   . Chronic back pain   . COPD (chronic obstructive pulmonary disease) ([place])   . Diabetes mellitus with complication ([place])   . ESRD on hemodialysis ([place])    a. Tues/Sat; b. 2/2 small kidneys  . Essential hypertension   . Fistula    lower left arm  . GERD (gastroesophageal reflux disease)   . Gout   . History of echocardiogram    a. TTE 01/2014: nl LV sys fxn, no valvular abnormalities; b. TTE 11/16: nl EF, mild LVH  . History of exercise stress test    a. 01/2014: no evidence of ischemia; b. Lexiscan 08/2015: no sig ischemia, severe GI uptake artifact, low risk; c. CPET @ [facility] 09/2016: exercised 3 min 12 sec on bike without incline, 2.28 METs, VO2 of 8.1, 48% of predicted, indicating mod to sev functional impairment, evidence of blunted HR, stroke volume, and BP augmentation as well as ventilation-perfusion mismatch with exercise  .  HLD (hyperlipidemia)   . 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 Renal insufficiency 09/24/2017   Dialysis patient.  . Sleep apnea      PAST SURGICAL HISTORY:   Past Surgical History:  Procedure Laterality Date  . carpel tunnel    . GALLBLADDER SURGERY    . PERIPHERAL VASCULAR CATHETERIZATION N/A 04/12/2015   Procedure: A/V Shuntogram/Fistulagram;  Surgeon: Algernon Huxley, MD;  Location: [facility];  Service: Cardiovascular;  Laterality: N/A;  . PERIPHERAL VASCULAR CATHETERIZATION N/A 04/12/2015   Procedure: A/V Shunt Intervention;  Surgeon: Algernon Huxley, MD;  Location: [facility];  Service: Cardiovascular;  Laterality: N/A;  . PERIPHERAL VASCULAR CATHETERIZATION N/A 06/09/2015   Procedure: Dialysis/Perma Catheter Removal;  Surgeon: Katha Cabal, MD;  Location: [facility];  Service: Cardiovascular;  Laterality: N/A;     SOCIAL HISTORY:   Social History   Tobacco Use  . Smoking status: Never Smoker  . Smokeless tobacco: Never Used  Substance Use Topics  . Alcohol use: No     FAMILY HISTORY:   Family History  Problem Relation Age of Onset  . Hypertension Mother   . Hyperlipidemia Mother   . Heart disease Father   . Heart attack Father 2  . Hypertension Father   . Hyperlipidemia Father   . Heart disease Brother  CABG   . Heart attack Brother   . Breast cancer Neg Hx      DRUG ALLERGIES:   Allergies  Allergen Reactions  . Codeine Nausea And Vomiting and Nausea Only  . Enalapril Maleate     Other reaction(s): Headache  . Nitrofurantoin Swelling and Rash    Other Reaction: swelling of body  . Sulfamethoxazole-Trimethoprim Swelling  . Vicodin [Hydrocodone-Acetaminophen] Nausea And Vomiting and Nausea Only  . 2,4-D Dimethylamine (Amisol) Rash    Other Reaction: h/a  . Baclofen Other (See Comments) and Nausea Only    lightheadness ,drowsiness , muscle weakness , twitching in hands   . Neosporin  [Neomycin-Bacitracin Zn-Polymyx] Other (See Comments) and Rash    Other Reaction: irritation Skin irritation   . Quinine Rash    Other Reaction: Vomiting rash, h/a, vision  . Ultram [Tramadol] Palpitations  . Zocor [Simvastatin] Other (See Comments) and Rash    Other Reaction: muscle spasms Muscle pain and spasms  . Bactrim [Sulfamethoxazole-Trimethoprim] Swelling  . Levodopa   . Macrodantin [Nitrofurantoin Macrocrystal] Swelling  . Quinine Derivatives Other (See Comments)    Vertigo,nausea vomiting blurred vision headache ears sensitivity   . Vasotec [Enalapril] Other (See Comments)    Headaches     MEDICATIONS AT HOME:   Prior to Admission medications   Medication Sig Start Date End Date Taking? Authorizing Provider  albuterol (PROVENTIL HFA;VENTOLIN HFA) 108 (90 Base) MCG/ACT inhaler Inhale 2 puffs into the lungs every 6 (six) hours as needed for wheezing or shortness of breath.    [provider]  amLODipine (NORVASC) 10 MG tablet Take 10 mg by mouth.  12/29/13   [provider]  aspirin EC 81 MG EC tablet Take 1 tablet (81 mg total) by mouth daily. 09/20/17   Bettey Costa, MD  calcium acetate (PHOSLO) 667 MG capsule Take 667 mg by mouth 3 (three) times daily with meals.     [provider]  carvedilol (COREG) 12.5 MG tablet Take 12.5 mg by mouth 2 (two) times daily with a meal.  01/09/14   [provider]  cetirizine (ZYRTEC) 10 MG tablet Take 10 mg by mouth daily.     [provider]  clopidogrel (PLAVIX) 75 MG tablet Take 1 tablet (75 mg total) by mouth daily. 01/18/18   Epifanio Lesches, MD  cyclobenzaprine (FLEXERIL) 10 MG tablet Take 10 mg by mouth daily as needed for muscle spasms.     [provider]  docusate sodium (COLACE) 100 MG capsule Take 100 mg by mouth 3 (three) times daily as needed for mild constipation.    [provider]  Fluticasone-Salmeterol (ADVAIR) 250-50 MCG/DOSE AEPB Inhale 1 puff into the  lungs 2 (two) times daily as needed (for shortness of breath).     [provider]  furosemide (LASIX) 80 MG tablet Take 1 tablet (80 mg total) by mouth daily. 03/17/18   Loletha Grayer, MD  HYDROcodone-acetaminophen (NORCO) 7.5-325 MG tablet Take 1 tablet by mouth 2 (two) times daily as needed for moderate pain.     [provider]  lidocaine-prilocaine (EMLA) cream Apply 1 application topically as needed (prior to treatment).     [provider]  nitroGLYCERIN (NITROSTAT) 0.4 MG SL tablet Place 1 tablet (0.4 mg total) under the tongue every 5 (five) minutes as needed for chest pain. 09/21/17   Strader, Fransisco Hertz, PA-C  omeprazole (PRILOSEC) 20 MG capsule Take 20 mg by mouth 2 (two) times daily before a meal.  [provider]  ondansetron (ZOFRAN-ODT) 4 MG disintegrating tablet Take 4 mg by mouth 2 (two) times daily as needed for nausea or vomiting.     [provider]  oxybutynin (DITROPAN XL) 15 MG 24 hr tablet Take 15 mg by mouth daily. 12/24/17   [provider]  pravastatin (PRAVACHOL) 40 MG tablet Take 40 mg by mouth at bedtime.     [provider]  senna (SENOKOT) 8.6 MG tablet Take 1 tablet by mouth daily.    [provider]  topiramate (TOPAMAX) 25 MG tablet Take 25 mg by mouth daily as needed (for headaches).     [provider]    REVIEW OF SYSTEMS:  Review of Systems  Unable to perform ROS: Acuity of condition     VITAL SIGNS:   Vitals:   11/28/18 2230 11/28/18 2300 11/29/18 0030 11/29/18 0050  BP:  (!) 178/62 (!) 193/55   Pulse: (!) 45 (!) 42 (!) 58 77  Resp: 15  17 (!) 24  Temp:      TempSrc:      SpO2: 97% 98% 100% 100%  Weight:      Height:       Wt Readings from Last 3 Encounters:  11/28/18 61.2 kg  11/01/18 60 kg  10/22/18 64.2 kg    PHYSICAL EXAMINATION:  Physical Exam  Vitals reviewed. Constitutional: She appears well-developed and well-nourished. No distress.  HENT:  Head:  Normocephalic and atraumatic.  Mouth/Throat: Oropharynx is clear and moist.  Eyes: Pupils are equal, round, and reactive to light. Conjunctivae and EOM are normal. No scleral icterus.  Neck: Normal range of motion. Neck supple. No JVD present. No thyromegaly present.  Cardiovascular: Normal rate, regular rhythm and intact distal pulses. Exam reveals no gallop and no friction rub.  No murmur heard. Respiratory: Effort normal and breath sounds normal. No respiratory distress. She has no wheezes. She has no rales.  GI: Soft. Bowel sounds are normal. She exhibits no distension. There is no abdominal tenderness.  Musculoskeletal: Normal range of motion.        General: No edema.     Comments: No arthritis, no gout  Lymphadenopathy:    She has no cervical adenopathy.  Neurological: No cranial nerve deficit.  No dysarthria, no aphasia, sponsor to verbal stimuli, she is oriented to person, but not to time place or circumstance.  She does not follow commands.  Skin: Skin is warm and dry. No rash noted. No erythema.  Psychiatric:  Unable to assess due to her condition    LABORATORY PANEL:   CBC Recent Labs  Lab 11/28/18 2147  WBC 12.4*  HGB 12.2  HCT 37.9  PLT 190   ------------------------------------------------------------------------------------------------------------------  Chemistries  Recent Labs  Lab 11/28/18 2147  NA 137  K 7.5*  CL 100  CO2 24  GLUCOSE 199*  BUN 44*  CREATININE 6.85*  CALCIUM 8.7*  AST 17  ALT 12  ALKPHOS 82  BILITOT 0.9   ------------------------------------------------------------------------------------------------------------------  Cardiac Enzymes Recent Labs  Lab 11/28/18 2147  TROPONINI <0.03   ------------------------------------------------------------------------------------------------------------------  RADIOLOGY:  Dg Pelvis 1-2 Views  Result Date: 11/28/2018 CLINICAL DATA:  Fall EXAM: PELVIS - 1-2 VIEW COMPARISON:  CP  02/26/2007 FINDINGS: SI joints are non widened. Pubic symphysis and rami are intact. No acute displaced fracture or malalignment. Vascular calcifications. IMPRESSION: No acute osseous abnormality. Electronically Signed   By: Donavan Foil M.D.   On: 11/28/2018 23:21   Ct Head Wo Contrast  Result Date: 11/28/2018 CLINICAL DATA:  73 y/o  F; found down. EXAM: CT HEAD WITHOUT CONTRAST CT CERVICAL SPINE WITHOUT CONTRAST TECHNIQUE: Multidetector CT imaging of the head and cervical spine was performed following the standard protocol without intravenous contrast. Multiplanar CT image reconstructions of the cervical spine were also generated. COMPARISON:  08/16/2017 CT head and cervical spine. 01/17/2018 MRI head. FINDINGS: CT HEAD FINDINGS Brain: No evidence of acute infarction, hemorrhage, hydrocephalus, extra-axial collection or mass lesion/mass effect. Multiple chronic lacunar infarcts are present within bilateral thalami, bilateral lentiform nuclei, and the left caudate head. Nonspecific white matter hypodensities are compatible with chronic microvascular ischemic changes, stable from prior MRI given differences in technique. Stable volume loss of the brain. Vascular: Calcific atherosclerosis of carotid siphons. No hyperdense vessel identified. Skull: Normal. Negative for fracture or focal lesion. Sinuses/Orbits: Chronic left sphenoid sinus opacification calcified inspissated secretions. Chronic inflammatory changes of the walls of the left sphenoid sinus. Additional visible paranasal sinuses and the mastoid air cells are normally aerated. High-riding right jugular bulb with thin sigmoid plate. Other: None. CT CERVICAL SPINE FINDINGS Alignment: Normal. Skull base and vertebrae: No acute fracture. No primary bone lesion or focal pathologic process. Soft tissues and spinal canal: No prevertebral fluid or swelling. No visible canal hematoma. Disc levels: Mild multilevel discogenic degenerative changes with loss of  intervertebral disc space height. Predominant left-sided facet arthropathy. No significant bony foraminal or canal stenosis. Upper chest: Negative. Other: Calcific atherosclerosis of the bilateral carotid systems. IMPRESSION: 1. No acute intracranial abnormality identified. 2. Stable chronic microvascular ischemic changes, chronic lacunar infarcts, and volume loss of the brain. 3. No acute fracture or dislocation of the cervical spine. Electronically Signed   By: Kristine Garbe M.D.   On: 11/28/2018 23:48   Ct Cervical Spine Wo Contrast  Result Date: 11/28/2018 CLINICAL DATA:  73 y/o  F; found down. EXAM: CT HEAD WITHOUT CONTRAST CT CERVICAL SPINE WITHOUT CONTRAST TECHNIQUE: Multidetector CT imaging of the head and cervical spine was performed following the standard protocol without intravenous contrast. Multiplanar CT image reconstructions of the cervical spine were also generated. COMPARISON:  08/16/2017 CT head and cervical spine. 01/17/2018 MRI head. FINDINGS: CT HEAD FINDINGS Brain: No evidence of acute infarction, hemorrhage, hydrocephalus, extra-axial collection or mass lesion/mass effect. Multiple chronic lacunar infarcts are present within bilateral thalami, bilateral lentiform nuclei, and the left caudate head. Nonspecific white matter hypodensities are compatible with chronic microvascular ischemic changes, stable from prior MRI given differences in technique. Stable volume loss of the brain. Vascular: Calcific atherosclerosis of carotid siphons. No hyperdense vessel identified. Skull: Normal. Negative for fracture or focal lesion. Sinuses/Orbits: Chronic left sphenoid sinus opacification calcified inspissated secretions. Chronic inflammatory changes of the walls of the left sphenoid sinus. Additional visible paranasal sinuses and the mastoid air cells are normally aerated. High-riding right jugular bulb with thin sigmoid plate. Other: None. CT CERVICAL SPINE FINDINGS Alignment: Normal. Skull  base and vertebrae: No acute fracture. No primary bone lesion or focal pathologic process. Soft tissues and spinal canal: No prevertebral fluid or swelling. No visible canal hematoma. Disc levels: Mild multilevel discogenic degenerative changes with loss of intervertebral disc space height. Predominant left-sided facet arthropathy. No significant bony foraminal or canal stenosis. Upper chest: Negative. Other: Calcific atherosclerosis of the bilateral carotid systems. IMPRESSION: 1. No acute intracranial abnormality identified. 2. Stable chronic microvascular ischemic changes, chronic lacunar infarcts, and volume loss of the brain. 3. No acute fracture or dislocation of the cervical spine. Electronically Signed  By: Kristine Garbe M.D.   On: 11/28/2018 23:48   Dg Chest Port 1 View  Result Date: 11/28/2018 CLINICAL DATA:  Altered mental status EXAM: PORTABLE CHEST 1 VIEW COMPARISON:  10/22/2018, 10/21/2018 FINDINGS: Low lung volumes. Cardiomegaly with vascular congestion and mild bilateral ground-glass opacities suspect for mild edema. Aortic atherosclerosis. No pneumothorax. IMPRESSION: Cardiomegaly with vascular congestion and mild ground-glass opacities suspicious for mild edema Electronically Signed   By: Donavan Foil M.D.   On: 11/28/2018 23:19    EKG:   Orders placed or performed during the hospital encounter of 11/28/18  . ED EKG 12-Lead  . ED EKG  . ED EKG 12-Lead  . ED EKG  . EKG 12-Lead  . EKG 12-Lead    IMPRESSION AND PLAN:  Principal Problem:   Acute on chronic diastolic CHF (congestive heart failure) ([place]) -She is undergoing dialysis emergently at this time.  We will continue home meds Active Problems:   Accelerated hypertension -dialysis as above, antihypertensives as necessary to bring her blood pressure down in the meantime   Hyperkalemia -she was given initial treatment with [place], was temporizing treatment with IV calcium, bicarb, insulin, definitive treatment  with dialysis as above   End stage renal disease on dialysis [facility]) -dialysis, nephrology consult   Type 2 diabetes mellitus with other specified complication ([place]) -sliding scale insulin   Hyperlipidemia -Home dose antilipid  Chart review performed and case discussed with ED provider. Labs, imaging and/or ECG reviewed by provider and discussed with patient/family. Management plans discussed with the patient and/or family.  DVT PROPHYLAXIS: SubQ heparin  GI PROPHYLAXIS:  PPI  ADMISSION STATUS: Inpatient     CODE STATUS: Full Code Status History    Date Active Date Inactive Code Status Order ID Comments User Context   10/21/2018 2235 10/23/2018 1643 Full Code [id number]  Lance Coon, MD Inpatient   03/16/2018 0806 03/17/2018 1637 Full Code [id number]  Loletha Grayer, MD ED   01/16/2018 1630 01/18/2018 2158 Full Code [id number]  Dustin Flock, MD Inpatient   01/16/2018 1340 01/16/2018 1630 DNR [id number]  Dustin Flock, MD ED   09/18/2017 1348 09/19/2017 1513 DNR [id number]  Loletha Grayer, MD ED   04/12/2015 1056 04/12/2015 1631 Full Code [id number]  Dew, Erskine Squibb, MD Inpatient      TOTAL CRITICAL CARE TIME TAKING CARE OF THIS PATIENT: 50 minutes.   Lorrie Gargan [place] 11/29/2018, 1:02 AM  [employer]  [phone]  CC: Primary care physician; [name], MD  Note:  This document was prepared using Dragon voice recognition software and may include unintentional dictation errors.

## 2018-11-29 NOTE — Progress Notes (Signed)
[place], [place] 11/29/18  Subjective:  Patient known to our practice from outpatient dialysis.  According to ER notes, patient was found on the floor by her roommate.  Her neighbor came over to call EMS.  They found her to be not acting like herself.  She was brought to the emergency room for further evaluation. In the ER, patient was found to have critical potassium of 7.5 with bradycardia.  Emergent hemodialysis was requested which was performed last night.  Postdialysis potassium is 4.2.  Patient is lethargic but able to answer a few questions.  She has no recollection of the events.  She denies being sick at home from any GI illness or respiratory illness.   Objective:  Vital signs in last 24 hours:  Temp:  [98 F (36.7 C)-98.6 F (37 C)] 98.2 F (36.8 C) (01/10 0503) Pulse Rate:  [37-94] 87 (01/10 0930) Resp:  [10-25] 16 (01/10 0930) BP: (125-193)/(47-138) 163/64 (01/10 0930) SpO2:  [84 %-100 %] 96 % (01/10 0930) Weight:  [60 kg-61.2 kg] 60 kg (01/10 0503)  Weight change:  Filed Weights   11/28/18 2158 11/29/18 0145 11/29/18 0503  Weight: 61.2 kg 61.2 kg 60 kg    Intake/Output:    Intake/Output Summary (Last 24 hours) at 11/29/2018 1009 Last data filed at 11/29/2018 0503 Gross per 24 hour  Intake -  Output 2009 ml  Net -2009 ml     Physical Exam: General:  Lethargic, laying in the bed  HEENT  mouth is dry  Neck  supple  Pulm/lungs  shallow breathing effort, no crackles  CV[facility]/Heart  no rub  Abdomen:   Soft, nontender  Extremities:  No edema  Neurologic:  Lethargic but able to answer a few questions  Skin:  Normal turgor, no acute rashes  Access:  Left arm AV fistula, good bruit       Basic Metabolic Panel:  Recent Labs  Lab 11/28/18 2147 11/29/18 0557  NA 137 139  K 7.5* 4.2  CL 100 100  CO2 24 29  GLUCOSE 199* 136*  BUN 44* 18  CREATININE 6.85* 3.86*  CALCIUM 8.7* 9.1     CBC: Recent Labs  Lab 11/28/18 2147  11/29/18 0557  WBC 12.4* 9.0  NEUTROABS 11.3*  --   HGB 12.2 10.8*  HCT 37.9 34.5*  MCV 93.6 94.3  PLT 190 158     No results found for: HEPBSAG, HEPBSAB, HEPBIGM    Microbiology:  Recent Results (from the past 240 hour(s))  Blood Culture (routine x 2)     Status: None (Preliminary result)   Collection Time: 11/28/18  9:47 PM  Result Value Ref Range Status   Specimen Description BLOOD BLOOD RIGHT FOREARM  Final   Special Requests   Final    BOTTLES DRAWN AEROBIC AND ANAEROBIC Blood Culture adequate volume   Culture   Final    NO GROWTH < 12 HOURS Performed at [facility], [address] [place], [place] [address]    Report Status PENDING  Incomplete  Blood Culture (routine x 2)     Status: None (Preliminary result)   Collection Time: 11/28/18 10:35 PM  Result Value Ref Range Status   Specimen Description BLOOD RIGHT ANTECUBITAL  Final   Special Requests   Final    BOTTLES DRAWN AEROBIC AND ANAEROBIC Blood Culture adequate volume   Culture   Final    NO GROWTH < 12 HOURS Performed at [facility], [address],  [place], [place] [address]    Report Status PENDING  Incomplete    Coagulation Studies: No results for input(s): LABPROT, INR in the last 72 hours.  Urinalysis: Recent Labs    11/28/18 2146  COLORURINE YELLOW*  LABSPEC 1.012  PHURINE 8.0  GLUCOSEU 50*  HGBUR NEGATIVE  BILIRUBINUR NEGATIVE  KETONESUR 5*  PROTEINUR >=300*  NITRITE NEGATIVE  LEUKOCYTESUR NEGATIVE      Imaging: Dg Pelvis 1-2 Views  Result Date: 11/28/2018 CLINICAL DATA:  Fall EXAM: PELVIS - 1-2 VIEW COMPARISON:  CP 02/26/2007 FINDINGS: SI joints are non widened. Pubic symphysis and rami are intact. No acute displaced fracture or malalignment. Vascular calcifications. IMPRESSION: No acute osseous abnormality. Electronically Signed   By: Donavan Foil M.D.   On: 11/28/2018 23:21   Ct Head Wo Contrast  Result Date: 11/28/2018 CLINICAL DATA:  73 y/o  F; found  down. EXAM: CT HEAD WITHOUT CONTRAST CT CERVICAL SPINE WITHOUT CONTRAST TECHNIQUE: Multidetector CT imaging of the head and cervical spine was performed following the standard protocol without intravenous contrast. Multiplanar CT image reconstructions of the cervical spine were also generated. COMPARISON:  08/16/2017 CT head and cervical spine. 01/17/2018 MRI head. FINDINGS: CT HEAD FINDINGS Brain: No evidence of acute infarction, hemorrhage, hydrocephalus, extra-axial collection or mass lesion/mass effect. Multiple chronic lacunar infarcts are present within bilateral thalami, bilateral lentiform nuclei, and the left caudate head. Nonspecific white matter hypodensities are compatible with chronic microvascular ischemic changes, stable from prior MRI given differences in technique. Stable volume loss of the brain. Vascular: Calcific atherosclerosis of carotid siphons. No hyperdense vessel identified. Skull: Normal. Negative for fracture or focal lesion. Sinuses/Orbits: Chronic left sphenoid sinus opacification calcified inspissated secretions. Chronic inflammatory changes of the walls of the left sphenoid sinus. Additional visible paranasal sinuses and the mastoid air cells are normally aerated. High-riding right jugular bulb with thin sigmoid plate. Other: None. CT CERVICAL SPINE FINDINGS Alignment: Normal. Skull base and vertebrae: No acute fracture. No primary bone lesion or focal pathologic process. Soft tissues and spinal canal: No prevertebral fluid or swelling. No visible canal hematoma. Disc levels: Mild multilevel discogenic degenerative changes with loss of intervertebral disc space height. Predominant left-sided facet arthropathy. No significant bony foraminal or canal stenosis. Upper chest: Negative. Other: Calcific atherosclerosis of the bilateral carotid systems. IMPRESSION: 1. No acute intracranial abnormality identified. 2. Stable chronic microvascular ischemic changes, chronic lacunar infarcts, and  volume loss of the brain. 3. No acute fracture or dislocation of the cervical spine. Electronically Signed   By: Kristine Garbe M.D.   On: 11/28/2018 23:48   Ct Cervical Spine Wo Contrast  Result Date: 11/28/2018 CLINICAL DATA:  73 y/o  F; found down. EXAM: CT HEAD WITHOUT CONTRAST CT CERVICAL SPINE WITHOUT CONTRAST TECHNIQUE: Multidetector CT imaging of the head and cervical spine was performed following the standard protocol without intravenous contrast. Multiplanar CT image reconstructions of the cervical spine were also generated. COMPARISON:  08/16/2017 CT head and cervical spine. 01/17/2018 MRI head. FINDINGS: CT HEAD FINDINGS Brain: No evidence of acute infarction, hemorrhage, hydrocephalus, extra-axial collection or mass lesion/mass effect. Multiple chronic lacunar infarcts are present within bilateral thalami, bilateral lentiform nuclei, and the left caudate head. Nonspecific white matter hypodensities are compatible with chronic microvascular ischemic changes, stable from prior MRI given differences in technique. Stable volume loss of the brain. Vascular: Calcific atherosclerosis of carotid siphons. No hyperdense vessel identified. Skull: Normal. Negative for fracture or focal lesion. Sinuses/Orbits: Chronic left sphenoid sinus opacification calcified inspissated secretions. Chronic  inflammatory changes of the walls of the left sphenoid sinus. Additional visible paranasal sinuses and the mastoid air cells are normally aerated. High-riding right jugular bulb with thin sigmoid plate. Other: None. CT CERVICAL SPINE FINDINGS Alignment: Normal. Skull base and vertebrae: No acute fracture. No primary bone lesion or focal pathologic process. Soft tissues and spinal canal: No prevertebral fluid or swelling. No visible canal hematoma. Disc levels: Mild multilevel discogenic degenerative changes with loss of intervertebral disc space height. Predominant left-sided facet arthropathy. No significant bony  foraminal or canal stenosis. Upper chest: Negative. Other: Calcific atherosclerosis of the bilateral carotid systems. IMPRESSION: 1. No acute intracranial abnormality identified. 2. Stable chronic microvascular ischemic changes, chronic lacunar infarcts, and volume loss of the brain. 3. No acute fracture or dislocation of the cervical spine. Electronically Signed   By: Kristine Garbe M.D.   On: 11/28/2018 23:48   Dg Chest Port 1 View  Result Date: 11/28/2018 CLINICAL DATA:  Altered mental status EXAM: PORTABLE CHEST 1 VIEW COMPARISON:  10/22/2018, 10/21/2018 FINDINGS: Low lung volumes. Cardiomegaly with vascular congestion and mild bilateral ground-glass opacities suspect for mild edema. Aortic atherosclerosis. No pneumothorax. IMPRESSION: Cardiomegaly with vascular congestion and mild ground-glass opacities suspicious for mild edema Electronically Signed   By: Donavan Foil M.D.   On: 11/28/2018 23:19     Medications:    . amLODipine  10 mg Oral Daily  . aspirin EC  81 mg Oral Daily  . calcium acetate  667 mg Oral TID WC  . carvedilol  12.5 mg Oral BID WC  . Chlorhexidine Gluconate Cloth  6 each Topical Q0600  . clopidogrel  75 mg Oral Daily  . furosemide  80 mg Oral Daily  . heparin  5,000 Units Subcutaneous Q8H  . insulin aspart  0-9 Units Subcutaneous Q6H  . mometasone-formoterol  2 puff Inhalation BID  . pantoprazole  20 mg Oral BID  . patiromer  16.8 g Oral Daily  . pravastatin  40 mg Oral QHS   acetaminophen **OR** acetaminophen, heparin, ondansetron **OR** ondansetron (ZOFRAN) IV  Assessment/ Plan:  72 y.o. Caucasian female with end-stage renal disease on hemodialysis, hypertension, hyperlipidemia, gout, GERD, diabetes type 2, COPD  [facility] [address]/Tuesday/Saturday 59.5 kg  End-stage renal disease and we are symptomatic Hyperkalemia - Emergent hemodialysis yesterday for hyperkalemia.  Postdialysis potassium is in the normal range. - Patient had recent  admission for volume overload in early December - Recommended to the patient that we switch her to 3 times a week dialysis regimen (from twice weekly now) - Continue furosemide supplementation -We will reevaluate for hemodialysis tomorrow  Secondary hyperparathyroidism -Continue outpatient regimen of calcium acetate  Anemia of chronic kidney disease -IMA globin 10.8.  We will continue to monitor    LOS: 0 Nichole Keltner 1/10/202010:09 AM  [place], [place]  Note: This note was prepared with Dragon dictation. Any transcription errors are unintentional

## 2018-11-29 NOTE — ED Notes (Signed)
Dinner tray provided, pt to be discharged when ride arrives.

## 2018-11-29 NOTE — Progress Notes (Signed)
Pre HD assessment     11/29/18 0145  Vital Signs  Temp 98 F (36.7 C)  Temp Source Oral  Pulse Rate 83  Pulse Rate Source Monitor  Resp (!) 23  BP (!) 142/50  BP Location Right Arm  BP Method Automatic  Patient Position (if appropriate) Lying  Oxygen Therapy  SpO2 93 %  O2 Device Room Air  Pain Assessment  Pain Scale PAINAD  PAINAD (Pain Assessment in Advanced Dementia)  Breathing 0  Negative Vocalization 0  Facial Expression 0  Body Language 0  Consolability 0  PAINAD Score 0  Dialysis Weight  Weight 61.2 kg  Type of Weight Pre-Dialysis  Time-Out for Hemodialysis  What Procedure? HD  Pt Identifiers(min of two) First/Last Name;MRN/Account#  Correct Site? Yes  Correct Side? Yes  Correct Procedure? Yes  Consents Verified? Yes  Rad Studies Available? N/A  Safety Precautions Reviewed? Yes  Engineer, civil (consulting) Number  (7A)  Station Number  (ED 17, emergent bedside )  UF/Alarm Test Passed  Conductivity: Meter 14  Conductivity: Machine  14.1  pH 7.6  Reverse Osmosis WRO #3  Normal Saline Lot Number [id number]  Dialyzer Lot Number [id number]  Disposable Set Lot Number [id number]  Machine Temperature 98.6 F (37 C)  Musician and Audible Yes  Blood Lines Intact and Secured Yes  Pre Treatment Patient Checks  Vascular access used during treatment Fistula  Hepatitis B Surface Antigen Results Negative  Date Hepatitis B Surface Antigen Drawn 11/05/18  Hepatitis B Surface Antibody 210 (>10)  Date Hepatitis B Surface Antibody Drawn 11/05/18  Hemodialysis Consent Verified Yes  Hemodialysis Standing Orders Initiated Yes  ECG (Telemetry) Monitor On Yes  Prime Ordered Normal Saline  Length of  DialysisTreatment -hour(s) 2.5 Hour(s)  Dialyzer Elisio 17H NR  Dialysate 1K  Dialysis Anticoagulant None  Dialysate Flow Ordered 600  Blood Flow Rate Ordered 300 mL/min  Ultrafiltration Goal 2 Liters  Pre Treatment Labs Hepatitis B Surface Antigen (HBSAB)  Dialysis  Blood Pressure Support Ordered Normal Saline  Education / Care Plan  Dialysis Education Provided Yes  Documented Education in Care Plan Yes

## 2018-11-29 NOTE — Progress Notes (Signed)
Pre HD assessment   11/29/18 0146  Neurological  Level of Consciousness Responds to Voice  Orientation Level Oriented to person;Disoriented to place;Disoriented to time;Disoriented to situation  Respiratory  Respiratory Pattern Regular;Unlabored  Chest Assessment Chest expansion symmetrical  Cardiac  ECG Monitor Yes  Cardiac Rhythm NSR  Vascular  R Radial Pulse +2  L Radial Pulse +2  Edema Generalized  Integumentary  Integumentary (WDL) X  Skin Color Appropriate for ethnicity  Musculoskeletal  Musculoskeletal (WDL) X  Generalized Weakness Yes  Assistive Device None  GU Assessment  Genitourinary (WDL) X  Genitourinary Symptoms  (HD)  Psychosocial  Psychosocial (WDL) X  Patient Behaviors Not interactive

## 2018-11-29 NOTE — Care Management Note (Addendum)
Case Management Note  Patient Details  Name: Phyllis [name] MRN: [id number] Date of Birth: [birth year]  Subjective/Objective:  RNCM consulted on patient to assist with any transition of care needs. Patient currently lives with her roommate [name] [phone]. Patient is able to complete activities of daily living at baseline using only a walker. No other DME needs. [name] is able to drive the patient to appointments, etc. PCP is [facility]. [facility] uses [facility] and reports difficulty affording her pain medication. She tells me she pays around $40 a month for her Norco. Goodrx coupon found for [facility] for $21.Patient is HD two days a week but per Nephrologist will beocme T/T/S at discharge.                    Action/Plan: RNCM to continue to follow for any needs.   Expected Discharge Date:                  Expected Discharge Plan:     In-House Referral:     Discharge planning Services  CM Consult, Medication Assistance  Post Acute Care Choice:    Choice offered to:     DME Arranged:    DME Agency:     HH Arranged:    HH Agency:     Status of Service:  In process, will continue to follow  If discussed at Long Length of Stay Meetings, dates discussed:    Additional Comments:  Latanya Maudlin, RN 11/29/2018, 2:26 PM

## 2018-11-29 NOTE — ED Notes (Signed)
To room to check on pt.  Pt is now alert and answering questions appropriately.  Pt states she has no memory of last nights events, but remembers talking to a doctor this morning.  Pt provided water, taking sips without difficulty.  VSS.  Pt calling family at this time.  Will continue to monitor closely.

## 2018-11-29 NOTE — ED Notes (Signed)
Pt is resting in bed. Respirations even/unlabored.

## 2018-11-29 NOTE — Progress Notes (Signed)
HD tx end    11/29/18 0454  Vital Signs  Pulse Rate 80  Pulse Rate Source Monitor  Resp 16  BP (!) 157/138  BP Location Right Arm  BP Method Automatic  Patient Position (if appropriate) Lying  Oxygen Therapy  SpO2 100 %  O2 Device Room Air  During Hemodialysis Assessment  Dialysis Fluid Bolus Normal Saline  Bolus Amount (mL) 250 mL  Intra-Hemodialysis Comments Tx completed

## 2018-11-29 NOTE — ED Notes (Signed)
Pt has finished dialysis. Dialysis nurse stated that she pulled 2090 off of patient. Pt is resting in bed. Pt is will still only move eyes when her name is called.

## 2018-11-29 NOTE — Progress Notes (Deleted)
Pre HD assessment    11/29/18 0145  Vital Signs  Temp 98 F (36.7 C)  Temp Source Oral  Pulse Rate 83  Pulse Rate Source Monitor  Resp (!) 23  BP (!) 142/50  BP Location Right Arm  BP Method Automatic  Patient Position (if appropriate) Lying  Oxygen Therapy  SpO2 93 %  O2 Device Room Air  Pain Assessment  Pain Scale PAINAD  PAINAD (Pain Assessment in Advanced Dementia)  Breathing 0  Negative Vocalization 0  Facial Expression 0  Body Language 0  Consolability 0  PAINAD Score 0  Dialysis Weight  Weight 61.2 kg  Type of Weight Pre-Dialysis  Time-Out for Hemodialysis  What Procedure? HD  Pt Identifiers(min of two) First/Last Name;MRN/Account#  Correct Site? Yes  Correct Side? Yes  Correct Procedure? Yes  Consents Verified? Yes  Rad Studies Available? N/A  Safety Precautions Reviewed? Yes  Engineer, civil (consulting) Number  (7A)  Station Number  (ED 17, emergent bedside )  UF/Alarm Test Passed  Conductivity: Meter 14  Conductivity: Machine  14.1  pH 7.6  Reverse Osmosis WRO #3  Normal Saline Lot Number [id number]  Dialyzer Lot Number [id number]  Disposable Set Lot Number [id number]  Machine Temperature 98.6 F (37 C)  Musician and Audible Yes  Blood Lines Intact and Secured Yes  Pre Treatment Patient Checks  Vascular access used during treatment Fistula  Hepatitis B Surface Antigen Results  (unk)  Hepatitis B Surface Antibody  (unk)  Date Hepatitis B Surface Antibody Drawn 11/29/18  Hemodialysis Consent Verified Yes  Hemodialysis Standing Orders Initiated Yes  ECG (Telemetry) Monitor On Yes  Prime Ordered Normal Saline  Length of  DialysisTreatment -hour(s) 2.5 Hour(s)  Dialyzer Elisio 17H NR  Dialysate 1K  Dialysis Anticoagulant None  Dialysate Flow Ordered 600  Blood Flow Rate Ordered 300 mL/min  Ultrafiltration Goal 2 Liters  Pre Treatment Labs Hepatitis B Surface Antigen (HBSAB)  Dialysis Blood Pressure Support Ordered Normal Saline  Education /  Care Plan  Dialysis Education Provided Yes  Documented Education in Care Plan Yes

## 2018-11-29 NOTE — Progress Notes (Signed)
HD tx start    11/29/18 0219  Vital Signs  Pulse Rate 81  Pulse Rate Source Monitor  Resp (!) 24  BP (!) 156/55  BP Location Right Arm  BP Method Automatic  Patient Position (if appropriate) Lying  Oxygen Therapy  SpO2 95 %  O2 Device Room Air  During Hemodialysis Assessment  Blood Flow Rate (mL/min) 300 mL/min  Arterial Pressure (mmHg) -130 mmHg  Venous Pressure (mmHg) 90 mmHg  Transmembrane Pressure (mmHg) 80 mmHg  Ultrafiltration Rate (mL/min) 1000 mL/min  Dialysate Flow Rate (mL/min) 600 ml/min  Conductivity: Machine  14.1  HD Safety Checks Performed Yes  Dialysis Fluid Bolus Normal Saline  Bolus Amount (mL) 250 mL  Intra-Hemodialysis Comments Tx initiated

## 2018-11-29 NOTE — ED Notes (Signed)
Pt provided lunch tray at this time.  Hospitalist discussing POC with pt.

## 2018-11-29 NOTE — ED Notes (Signed)
Dr. Vianne Bulls called this RN, states that she spoke with Dr. Candiss Norse again and given that pt is still in ED, he feels that discharge would be appropriate.  Pt notified of same.  Will begin working on d/c papers.

## 2018-11-29 NOTE — ED Notes (Signed)
Spoke with Dr. Marcille Blanco about assisting pt due to her inability to swallow medication at this time.

## 2018-11-29 NOTE — Progress Notes (Signed)
Post HD assessment    11/29/18 0500  Neurological  Level of Consciousness Responds to Voice  Orientation Level Oriented to person;Disoriented to place;Disoriented to time;Disoriented to situation  Respiratory  Respiratory Pattern Regular;Unlabored  Chest Assessment Chest expansion symmetrical  Cardiac  ECG Monitor Yes  Cardiac Rhythm NSR  Vascular  R Radial Pulse +2  L Radial Pulse +2  Edema Generalized  Integumentary  Integumentary (WDL) X  Skin Color Appropriate for ethnicity  Musculoskeletal  Musculoskeletal (WDL) X  Generalized Weakness Yes  Assistive Device None  GU Assessment  Genitourinary (WDL) X  Genitourinary Symptoms  (HD)  Psychosocial  Psychosocial (WDL) X  Patient Behaviors Not interactive

## 2018-11-29 NOTE — ED Provider Notes (Signed)
I assumed care of the patient at 11:00 PM from Dr. Clearnce Hasten.  Patient with market altered mental status and is nonverbal at this time.  Heart rate 32 patient is a persistent bradycardia while in the emergency department.  Laboratory data notable for potassium of 7.5.  Patient given 1 amp calcium gluconate, 1 amp sodium bicarb, 6 units IV insulin, 10 mg albuterol nebulized.  In addition patient given Veltassa 16.8 mg.  Patient discussed with Dr. Candiss Norse nephrologist on call as well as Dr. Jannifer Franklin hospitalist on-call.  Plan to dialyze the patient here in the emergency department with subsequent admission.  Following above-stated intervention patient's heart rate currently  .Critical Care Performed by: Gregor Hams, MD Authorized by: Gregor Hams, MD   Critical care provider statement:    Critical care time (minutes):  45   Critical care time was exclusive of:  Separately billable procedures and treating other patients and teaching time (Severe hyperkalemia)   Critical care was necessary to treat or prevent imminent or life-threatening deterioration of the following conditions:  Circulatory failure and cardiac failure   Critical care was time spent personally by me on the following activities:  Development of treatment plan with patient or surrogate, discussions with consultants, evaluation of patient's response to treatment, examination of patient, obtaining history from patient or surrogate, ordering and performing treatments and interventions, ordering and review of laboratory studies, ordering and review of radiographic studies, pulse oximetry, re-evaluation of patient's condition and review of old charts   I assumed direction of critical care for this patient from another provider in my specialty: no       Gregor Hams, MD 11/29/18 [phone]

## 2018-11-29 NOTE — Progress Notes (Signed)
Discussed with Dr. Candiss Norse, patient is already scheduled for dialysis tomorrow.  As patient is alert, awake, oriented, hemodynamically stable and still waiting for a bed in the emergency room, at this time we feel it safe for her to go home and have dialysis tomorrow.  She can continue all her home meds, does not need Veltassa.

## 2018-11-29 NOTE — ED Notes (Signed)
Report received, care of pt assumed.  No change in status, VSS, awaiting inpt bed placement, will continue to monitor.

## 2018-11-30 LAB — HEPATITIS B SURFACE ANTIGEN: Hepatitis B Surface Ag: NEGATIVE

## 2018-11-30 LAB — HEPATITIS B SURFACE ANTIBODY, QUANTITATIVE: Hepatitis B-Post: 83.7 m[IU]/mL (ref >9.9)

## 2018-12-02 LAB — URINE CULTURE: Culture: 10000 — AB

## 2018-12-03 LAB — CULTURE, BLOOD (ROUTINE X 2)
Culture: NO GROWTH
Culture: NO GROWTH
Special Requests: ADEQUATE
Special Requests: ADEQUATE

## 2018-12-21 ENCOUNTER — Emergency Department: Payer: Medicare Other

## 2018-12-21 ENCOUNTER — Emergency Department
Admission: EM | Admit: 2018-12-21 | Discharge: 2018-12-21 | Disposition: A | Discharge status: Home / Self Care | Payer: Medicare Other | Attending: Emergency Medicine | Admitting: Emergency Medicine

## 2018-12-21 DIAGNOSIS — Z79899 Other long term (current) drug therapy: Secondary | ICD-10-CM | POA: Diagnosis not present

## 2018-12-21 DIAGNOSIS — N186 End stage renal disease: Secondary | ICD-10-CM | POA: Insufficient documentation

## 2018-12-21 DIAGNOSIS — R0602 Shortness of breath: Secondary | ICD-10-CM | POA: Diagnosis present

## 2018-12-21 DIAGNOSIS — I5032 Chronic diastolic (congestive) heart failure: Secondary | ICD-10-CM | POA: Insufficient documentation

## 2018-12-21 DIAGNOSIS — R079 Chest pain, unspecified: Secondary | ICD-10-CM | POA: Diagnosis not present

## 2018-12-21 DIAGNOSIS — I132 Hypertensive heart and chronic kidney disease with heart failure and with stage 5 chronic kidney disease, or end stage renal disease: Secondary | ICD-10-CM | POA: Diagnosis not present

## 2018-12-21 DIAGNOSIS — E1122 Type 2 diabetes mellitus with diabetic chronic kidney disease: Secondary | ICD-10-CM | POA: Insufficient documentation

## 2018-12-21 DIAGNOSIS — Z7982 Long term (current) use of aspirin: Secondary | ICD-10-CM | POA: Diagnosis not present

## 2018-12-21 DIAGNOSIS — J45909 Unspecified asthma, uncomplicated: Secondary | ICD-10-CM | POA: Diagnosis not present

## 2018-12-21 DIAGNOSIS — Z992 Dependence on renal dialysis: Secondary | ICD-10-CM | POA: Diagnosis not present

## 2018-12-21 LAB — TROPONIN I
Troponin I: 0.03 ng/mL (ref <0.03)
Troponin I: 0.04 ng/mL (ref <0.03)

## 2018-12-21 LAB — BASIC METABOLIC PANEL
Anion gap: 8 (ref 5–15)
BUN: 17 mg/dL (ref 8–23)
CO2: 31 mmol/L (ref 22–32)
Calcium: 8.8 mg/dL — ABNORMAL LOW (ref 8.9–10.3)
Chloride: 96 mmol/L — ABNORMAL LOW (ref 98–111)
Creatinine, Ser: 3.65 mg/dL — ABNORMAL HIGH (ref 0.44–1.00)
GFR calc Af Amer: 14 mL/min — ABNORMAL LOW (ref >60)
GFR calc non Af Amer: 12 mL/min — ABNORMAL LOW (ref >60)
Glucose, Bld: 121 mg/dL — ABNORMAL HIGH (ref 70–99)
Potassium: 3.1 mmol/L — ABNORMAL LOW (ref 3.5–5.1)
Sodium: 135 mmol/L (ref 135–145)

## 2018-12-21 LAB — CBC
HCT: 33.9 % — ABNORMAL LOW (ref 36.0–46.0)
Hemoglobin: 11.1 g/dL — ABNORMAL LOW (ref 12.0–15.0)
MCH: 29.1 pg (ref 26.0–34.0)
MCHC: 32.7 g/dL (ref 30.0–36.0)
MCV: 88.7 fL (ref 80.0–100.0)
Platelets: 161 10*3/uL (ref 150–400)
RBC: 3.82 MIL/uL — ABNORMAL LOW (ref 3.87–5.11)
RDW: 13.4 % (ref 11.5–15.5)
WBC: 6.9 10*3/uL (ref 4.0–10.5)
nRBC: 0 % (ref 0.0–0.2)

## 2018-12-21 MED ORDER — SODIUM CHLORIDE 0.9% FLUSH: 3.0000 mL | Freq: Once | INTRAVENOUS | Status: DC

## 2018-12-21 NOTE — ED Notes (Signed)
Date and time results received: 12/21/18 1924 (use smartphrase ".now" to insert current time)  Test: troponin Critical Value: 0.03   Name of Provider Notified: Dr. Angelene Giovanni  Orders Received? Or Actions Taken?:

## 2018-12-21 NOTE — ED Triage Notes (Addendum)
Pt came to ED via EMS c/o central chest pain for the past hour and a half. Pt normally takes oxycodone, took one 20 mins prior to arrival. Reports dull central chest pain 5/10.History of copd.

## 2018-12-21 NOTE — ED Notes (Signed)
Pt is dialysis pt, T Th, Sat schedule. Pt had full treatment today. Chest pain started about 1.5 hour after dialysis

## 2018-12-21 NOTE — ED Provider Notes (Signed)
[facility] Emergency Department Provider Note  Time seen: 6:37 PM  I have reviewed the triage vital signs and the nursing notes.   HISTORY  Chief Complaint Chest Pain    HPI Phyllis [name] is a 73 y.o. female with a past medical history of anxiety, CHF, COPD, diabetes, end-stage renal disease on hemodialysis, received dialysis earlier today, costochondritis, presents to the emergency department for shortness of breath and chest pain.  According to the patient several hours ago while at home watching TV she developed mild shortness of breath and developed pain in the center of her chest.  Patient states both the shortness of breath and chest pain have since gone away but she wanted to be safe so she came to the emergency department for evaluation.   Past Medical History:  Diagnosis Date  . Allergy   . Anemia of chronic disease   . Anxiety   . Aortic atherosclerosis ([place])   . Asthma   . CHF (congestive heart failure) ([place])   . Chronic back pain   . COPD (chronic obstructive pulmonary disease) ([place])   . Diabetes mellitus with complication ([place])   . ESRD on hemodialysis ([place])    a. Tues/Sat; b. 2/2 small kidneys  . Essential hypertension   . Fistula    lower left arm  . GERD (gastroesophageal reflux disease)   . Gout   . History of echocardiogram    a. TTE 01/2014: nl LV sys fxn, no valvular abnormalities; b. TTE 11/16: nl EF, mild LVH  . History of exercise stress test    a. 01/2014: no evidence of ischemia; b. Lexiscan 08/2015: no sig ischemia, severe GI uptake artifact, low risk; c. CPET @ [facility] 09/2016: exercised 3 min 12 sec on bike without incline, 2.28 METs, VO2 of 8.1, 48% of predicted, indicating mod to sev functional impairment, evidence of blunted HR, stroke volume, and BP augmentation as well as ventilation-perfusion mismatch with exercise  . HLD (hyperlipidemia)   . 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 Renal  insufficiency 09/24/2017   Dialysis patient.  . Sleep apnea     Patient Active Problem List   Diagnosis Date Noted  . Hyperkalemia 11/29/2018  . Sepsis ([place]) 11/29/2018  . Chronic diastolic heart failure ([place]) 11/02/2018  . HTN (hypertension) 11/02/2018  . Accelerated hypertension 10/21/2018  . Flash pulmonary edema ([place]) 10/21/2018  . Pressure injury of skin 03/16/2018  . CVA (cerebral vascular accident) ([place]) 01/16/2018  . Aortic atherosclerosis ([place]) 12/11/2017  . Chest pain 09/18/2017  . Chest discomfort 08/06/2015  . Hyperlipidemia 08/06/2015  . Complication from renal dialysis device 04/12/2015  . SOB (shortness of breath) 02/01/2015  . End stage renal disease on dialysis ([place]) 02/01/2015  . Type 2 diabetes mellitus with other specified complication ([place]) 99/83/3825  . Asthma 02/01/2015  . Acute on chronic diastolic CHF (congestive heart failure) ([place]) 02/01/2015    Past Surgical History:  Procedure Laterality Date  . carpel tunnel    . GALLBLADDER SURGERY    . PERIPHERAL VASCULAR CATHETERIZATION N/A 04/12/2015   Procedure: A/V Shuntogram/Fistulagram;  Surgeon: Algernon Huxley, MD;  Location: [place] [facility];  Service: Cardiovascular;  Laterality: N/A;  . PERIPHERAL VASCULAR CATHETERIZATION N/A 04/12/2015   Procedure: A/V Shunt Intervention;  Surgeon: Algernon Huxley, MD;  Location: [place] [facility];  Service: Cardiovascular;  Laterality: N/A;  . PERIPHERAL VASCULAR CATHETERIZATION N/A 06/09/2015   Procedure: Dialysis/Perma Catheter Removal;  Surgeon: Katha Cabal, MD;  Location: [place] [facility];  Service: Cardiovascular;  Laterality: N/A;    Prior to Admission medications   Medication Sig Start Date End Date Taking? Authorizing Provider  albuterol (PROVENTIL HFA;VENTOLIN HFA) 108 (90 Base) MCG/ACT inhaler Inhale 2 puffs into the lungs every 6 (six) hours as needed for wheezing or shortness of breath.    [provider]  amLODipine (NORVASC) 10 MG  tablet Take 10 mg by mouth.  12/29/13   [provider]  aspirin EC 81 MG EC tablet Take 1 tablet (81 mg total) by mouth daily. 09/20/17   Bettey Costa, MD  calcium acetate (PHOSLO) 667 MG capsule Take 667 mg by mouth 3 (three) times daily with meals.     [provider]  carvedilol (COREG) 12.5 MG tablet Take 12.5 mg by mouth 2 (two) times daily with a meal.  01/09/14   [provider]  cetirizine (ZYRTEC) 10 MG tablet Take 10 mg by mouth daily.     [provider]  clopidogrel (PLAVIX) 75 MG tablet Take 1 tablet (75 mg total) by mouth daily. 01/18/18   Epifanio Lesches, MD  cyclobenzaprine (FLEXERIL) 10 MG tablet Take 10 mg by mouth daily as needed for muscle spasms.     [provider]  docusate sodium (COLACE) 100 MG capsule Take 100 mg by mouth 3 (three) times daily as needed for mild constipation.    [provider]  Fluticasone-Salmeterol (ADVAIR) 250-50 MCG/DOSE AEPB Inhale 1 puff into the lungs 2 (two) times daily as needed (for shortness of breath).     [provider]  furosemide (LASIX) 80 MG tablet Take 1 tablet (80 mg total) by mouth daily. 03/17/18   Loletha Grayer, MD  lidocaine-prilocaine (EMLA) cream Apply 1 application topically as needed (prior to treatment).     [provider]  omeprazole (PRILOSEC) 20 MG capsule Take 20 mg by mouth 2 (two) times daily before a meal.    [provider]  oxybutynin (DITROPAN XL) 15 MG 24 hr tablet Take 15 mg by mouth daily. 12/24/17   [provider]  pravastatin (PRAVACHOL) 40 MG tablet Take 40 mg by mouth at bedtime.     [provider]  senna (SENOKOT) 8.6 MG tablet Take 1 tablet by mouth daily.    [provider]  topiramate (TOPAMAX) 25 MG tablet Take 25 mg by mouth daily.     [provider]    Allergies  Allergen Reactions  . Codeine Nausea And Vomiting and Nausea Only  . Enalapril Maleate     Other reaction(s): Headache   . Nitrofurantoin Swelling and Rash    Other Reaction: swelling of body  . Sulfamethoxazole-Trimethoprim Swelling  . Vicodin [Hydrocodone-Acetaminophen] Nausea And Vomiting and Nausea Only  . 2,4-D Dimethylamine (Amisol) Rash    Other Reaction: h/a  . Baclofen Other (See Comments) and Nausea Only    lightheadness ,drowsiness , muscle weakness , twitching in hands   . Neosporin [Neomycin-Bacitracin Zn-Polymyx] Other (See Comments) and Rash    Other Reaction: irritation Skin irritation   . Quinine Rash    Other Reaction: Vomiting rash, h/a, vision  . Ultram [Tramadol] Palpitations  . Zocor [Simvastatin] Other (See Comments) and Rash    Other Reaction: muscle spasms Muscle pain and spasms  . Bactrim [Sulfamethoxazole-Trimethoprim] Swelling  . Levodopa   . Macrodantin [Nitrofurantoin Macrocrystal] Swelling  . Quinine Derivatives Other (See Comments)    Vertigo,nausea vomiting blurred vision headache  ears sensitivity   . Vasotec [Enalapril] Other (See Comments)    Headaches     Family History  Problem Relation Age of Onset  . Hypertension Mother   . Hyperlipidemia Mother   . Heart disease Father   . Heart attack Father 81  . Hypertension Father   . Hyperlipidemia Father   . Heart disease Brother        CABG   . Heart attack Brother   . Breast cancer Neg Hx     Social History Social History   Tobacco Use  . Smoking status: Never Smoker  . Smokeless tobacco: Never Used  Substance Use Topics  . Alcohol use: No  . Drug use: No    Review of Systems Constitutional: Negative for fever. Cardiovascular: Positive for chest pain several hours ago now resolved Respiratory: Shortness of breath now resolved Gastrointestinal: Negative for abdominal pain, vomiting Genitourinary: On hemodialysis. Musculoskeletal: Negative for leg pain or swelling. Skin: Negative for skin complaints  Neurological: Negative for headache All other ROS  negative  ____________________________________________   PHYSICAL EXAM:  VITAL SIGNS: ED Triage Vitals  Enc Vitals Group     BP --      Pulse Rate 12/21/18 1835 80     Resp 12/21/18 1835 14     Temp --      Temp src --      SpO2 --      Weight 12/21/18 1832 132 lb 4.4 oz (60 kg)     Height 12/21/18 1832 4\' 10"  (1.473 m)     Head Circumference --      Peak Flow --      Pain Score 12/21/18 1833 5     Pain Loc --      Pain Edu? --      Excl. in [place]? --    Constitutional: Alert and oriented. Well appearing and in no distress. Eyes: Normal exam ENT   Head: Normocephalic and atraumatic.   Mouth/Throat: Mucous membranes are moist. Cardiovascular: Normal rate, regular rhythm.  Respiratory: Normal respiratory effort without tachypnea nor retractions. Breath sounds are clear  Gastrointestinal: Soft and nontender. No distention.   Musculoskeletal: Nontender with normal range of motion in all extremities.  Left upper extremity AV fistula with good thrill. Neurologic:  Normal speech and language. No gross focal neurologic deficits Skin:  Skin is warm, dry and intact.  Psychiatric: Mood and affect are normal.   ____________________________________________    EKG  EKG viewed and interpreted by myself shows a normal sinus rhythm at 74 bpm with a narrow QRS, normal axis, normal intervals, no ST changes.  ____________________________________________    RADIOLOGY  Chest x-ray shows cardiomegaly without acute abnormality  ____________________________________________   INITIAL IMPRESSION / ASSESSMENT AND PLAN / ED COURSE  Pertinent labs & imaging results that were available during my care of the patient were reviewed by me and considered in my medical decision making (see chart for details).  Patient presents to the emergency department for acute onset of chest pain or shortness of breath approximately 1 to 2 hours ago, now feels much better.  Patient states a history of  costochondritis, which feels similar.  Wanted to be safe so she came to the emergency department for evaluation.  Currently the patient appears well, no distress.  States she was having dull central chest pain 5/10 in severity but now states that has resolved.  We will check labs, chest x-ray and EKG.  Repeat troponin is largely unchanged.  Slight elevation chronically as the patient is on dialysis.  Labs otherwise largely at baseline.  Patient is feeling much better.  We will discharge patient home with PCP follow-up.  Patient agreeable plan of care.  Family agreeable.  ____________________________________________   FINAL CLINICAL IMPRESSION(S) / ED DIAGNOSES  Chest pain Shortness of breath   Harvest Dark, MD 12/21/18 2252

## 2018-12-21 NOTE — ED Notes (Signed)
Pt being transported to x-ray by Jarrett Soho, [employer]

## 2019-01-13 ENCOUNTER — Ambulatory Visit: Payer: Medicare Other | Attending: Family | Admitting: Family

## 2019-01-13 ENCOUNTER — Encounter: Payer: Self-pay | Admitting: Family

## 2019-01-13 ENCOUNTER — Encounter: Payer: Self-pay | Admitting: Pharmacist

## 2019-01-13 VITALS — BP 157/57 | HR 76 | Resp 18 | Ht <= 58 in | Wt 133.1 lb

## 2019-01-13 DIAGNOSIS — G8929 Other chronic pain: Secondary | ICD-10-CM | POA: Insufficient documentation

## 2019-01-13 DIAGNOSIS — Z7902 Long term (current) use of antithrombotics/antiplatelets: Secondary | ICD-10-CM | POA: Diagnosis not present

## 2019-01-13 DIAGNOSIS — Z7982 Long term (current) use of aspirin: Secondary | ICD-10-CM | POA: Diagnosis not present

## 2019-01-13 DIAGNOSIS — E1122 Type 2 diabetes mellitus with diabetic chronic kidney disease: Secondary | ICD-10-CM | POA: Diagnosis not present

## 2019-01-13 DIAGNOSIS — Z79899 Other long term (current) drug therapy: Secondary | ICD-10-CM | POA: Insufficient documentation

## 2019-01-13 DIAGNOSIS — N186 End stage renal disease: Secondary | ICD-10-CM

## 2019-01-13 DIAGNOSIS — M549 Dorsalgia, unspecified: Secondary | ICD-10-CM | POA: Insufficient documentation

## 2019-01-13 DIAGNOSIS — D631 Anemia in chronic kidney disease: Secondary | ICD-10-CM | POA: Diagnosis not present

## 2019-01-13 DIAGNOSIS — Z7901 Long term (current) use of anticoagulants: Secondary | ICD-10-CM | POA: Diagnosis not present

## 2019-01-13 DIAGNOSIS — E875 Hyperkalemia: Secondary | ICD-10-CM | POA: Insufficient documentation

## 2019-01-13 DIAGNOSIS — M109 Gout, unspecified: Secondary | ICD-10-CM | POA: Diagnosis not present

## 2019-01-13 DIAGNOSIS — Z992 Dependence on renal dialysis: Secondary | ICD-10-CM | POA: Insufficient documentation

## 2019-01-13 DIAGNOSIS — E785 Hyperlipidemia, unspecified: Secondary | ICD-10-CM | POA: Insufficient documentation

## 2019-01-13 DIAGNOSIS — R0789 Other chest pain: Secondary | ICD-10-CM | POA: Diagnosis not present

## 2019-01-13 DIAGNOSIS — Z8249 Family history of ischemic heart disease and other diseases of the circulatory system: Secondary | ICD-10-CM | POA: Insufficient documentation

## 2019-01-13 DIAGNOSIS — G4733 Obstructive sleep apnea (adult) (pediatric): Secondary | ICD-10-CM | POA: Diagnosis not present

## 2019-01-13 DIAGNOSIS — K219 Gastro-esophageal reflux disease without esophagitis: Secondary | ICD-10-CM | POA: Insufficient documentation

## 2019-01-13 DIAGNOSIS — I5032 Chronic diastolic (congestive) heart failure: Secondary | ICD-10-CM | POA: Insufficient documentation

## 2019-01-13 DIAGNOSIS — J449 Chronic obstructive pulmonary disease, unspecified: Secondary | ICD-10-CM | POA: Insufficient documentation

## 2019-01-13 DIAGNOSIS — I132 Hypertensive heart and chronic kidney disease with heart failure and with stage 5 chronic kidney disease, or end stage renal disease: Secondary | ICD-10-CM | POA: Insufficient documentation

## 2019-01-13 DIAGNOSIS — R5383 Other fatigue: Secondary | ICD-10-CM | POA: Diagnosis present

## 2019-01-13 DIAGNOSIS — R001 Bradycardia, unspecified: Secondary | ICD-10-CM | POA: Insufficient documentation

## 2019-01-13 DIAGNOSIS — I10 Essential (primary) hypertension: Secondary | ICD-10-CM

## 2019-01-13 NOTE — Patient Instructions (Signed)
Continue weighing daily and call for an overnight weight gain of > 2 pounds or a weekly weight gain of >5 pounds. 

## 2019-01-13 NOTE — Progress Notes (Signed)
Patient ID: Phyllis [name], female    DOB: 22-Apr-1946, 72 y.o.   MRN: [id number]  HPI  Phyllis [name] is a 73 y/o female with a history of asthma, DM, HTN, CKD, hyperlipidemia, COPD, obstructive sleep apnea, GERD, gout, anemia, anxiety and ESRD on dialysis.   Echo report from 12/27/17 reviewed and showed an EF of 55-60% along with mild Phyllis, mild/moderate AS and a normal PA pressure.   Was in the ED 12/21/2018 due to chest pain which had resolved prior to ED visit. Labs normal and she was released. Was in the ED 11/28/2018 due to altered mental status. Patient also bradycardic with hyperkalemia. Dialysis done and she was later released. Admitted 10/21/18 due to acute on chronic HF. Initially needed bipap and NTG drip due to HTN. Had dialysis while admitted. Elevated troponin thought to be due to demand ischemia. She was discharged after 2 days.   She presents today for a follow-up visit with a chief complaint of moderate fatigue upon minimal exertion. She describes this as chronic in nature having been present for several years. She has associated occasional chest tightness and neck pain along with this. She denies any difficulty sleeping, dizziness, abdominal distention, palpitations, pedal edema, chest pain, shortness of breath or weight gain. She says that she missed her dialysis session on 01/11/2019 due to snow on her car. Is next scheduled for dialysis tomorrow.    Past Medical History:  Diagnosis Date  . Allergy   . Anemia of chronic disease   . Anxiety   . Aortic atherosclerosis ([place])   . Asthma   . CHF (congestive heart failure) ([place])   . Chronic back pain   . COPD (chronic obstructive pulmonary disease) ([place])   . Diabetes mellitus with complication ([place])   . ESRD on hemodialysis ([place])    a. Tues/Sat; b. 2/2 small kidneys  . Essential hypertension   . Fistula    lower left arm  . GERD (gastroesophageal reflux disease)   . Gout   . History of echocardiogram    a. TTE 01/2014: nl LV  sys fxn, no valvular abnormalities; b. TTE 11/16: nl EF, mild LVH  . History of exercise stress test    a. 01/2014: no evidence of ischemia; b. Lexiscan 08/2015: no sig ischemia, severe GI uptake artifact, low risk; c. CPET @ [facility] 09/2016: exercised 3 min 12 sec on bike without incline, 2.28 METs, VO2 of 8.1, 48% of predicted, indicating mod to sev functional impairment, evidence of blunted HR, stroke volume, and BP augmentation as well as ventilation-perfusion mismatch with exercise  . HLD (hyperlipidemia)   . 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 Renal insufficiency 09/24/2017   Dialysis patient.  . Sleep apnea    Past Surgical History:  Procedure Laterality Date  . carpel tunnel    . GALLBLADDER SURGERY    . PERIPHERAL VASCULAR CATHETERIZATION N/A 04/12/2015   Procedure: A/V Shuntogram/Fistulagram;  Surgeon: Algernon Huxley, MD;  Location: [place] [facility];  Service: Cardiovascular;  Laterality: N/A;  . PERIPHERAL VASCULAR CATHETERIZATION N/A 04/12/2015   Procedure: A/V Shunt Intervention;  Surgeon: Algernon Huxley, MD;  Location: [facility] [facility];  Service: Cardiovascular;  Laterality: N/A;  . PERIPHERAL VASCULAR CATHETERIZATION N/A 06/09/2015   Procedure: Dialysis/Perma Catheter Removal;  Surgeon: Katha Cabal, MD;  Location: [facility];  Service: Cardiovascular;  Laterality: N/A;   Family History  Problem Relation Age of Onset  .  Hypertension Mother   . Hyperlipidemia Mother   . Heart disease Father   . Heart attack Father 43  . Hypertension Father   . Hyperlipidemia Father   . Heart disease Brother        CABG   . Heart attack Brother   . Breast cancer Neg Hx    Social History   Tobacco Use  . Smoking status: Never Smoker  . Smokeless tobacco: Never Used  Substance Use Topics  . Alcohol use: No   Allergies  Allergen Reactions  . Codeine Nausea And Vomiting and Nausea Only  . Enalapril Maleate     Other reaction(s): Headache  .  Nitrofurantoin Swelling and Rash    Other Reaction: swelling of body  . Sulfamethoxazole-Trimethoprim Swelling  . Vicodin [Hydrocodone-Acetaminophen] Nausea And Vomiting and Nausea Only  . 2,4-D Dimethylamine (Amisol) Rash    Other Reaction: h/a  . Baclofen Other (See Comments) and Nausea Only    lightheadness ,drowsiness , muscle weakness , twitching in hands   . Neosporin [Neomycin-Bacitracin Zn-Polymyx] Other (See Comments) and Rash    Other Reaction: irritation Skin irritation   . Quinine Rash    Other Reaction: Vomiting rash, h/a, vision  . Ultram [Tramadol] Palpitations  . Zocor [Simvastatin] Other (See Comments) and Rash    Other Reaction: muscle spasms Muscle pain and spasms  . Bactrim [Sulfamethoxazole-Trimethoprim] Swelling  . Levodopa   . Macrodantin [Nitrofurantoin Macrocrystal] Swelling  . Quinine Derivatives Other (See Comments)    Vertigo,nausea vomiting blurred vision headache ears sensitivity   . Vasotec [Enalapril] Other (See Comments)    Headaches    Prior to Admission medications   Medication Sig Start Date End Date Taking? Authorizing Provider  albuterol (PROVENTIL HFA;VENTOLIN HFA) 108 (90 Base) MCG/ACT inhaler Inhale 2 puffs into the lungs every 6 (six) hours as needed for wheezing or shortness of breath.   Yes [provider]  amLODipine (NORVASC) 10 MG tablet Take 10 mg by mouth.  12/29/13  Yes [provider]  aspirin EC 81 MG EC tablet Take 1 tablet (81 mg total) by mouth daily. 09/20/17  Yes Mody, Ulice Bold, MD  calcium acetate (PHOSLO) 667 MG capsule Take 667 mg by mouth 3 (three) times daily with meals.    Yes [provider]  carvedilol (COREG) 12.5 MG tablet Take 12.5 mg by mouth 2 (two) times daily with a meal.  01/09/14  Yes [provider]  cetirizine (ZYRTEC) 10 MG tablet Take 10 mg by mouth daily.    Yes [provider]  clopidogrel (PLAVIX) 75 MG tablet Take 1 tablet (75 mg total) by mouth daily. 01/18/18   Yes Epifanio Lesches, MD  cyclobenzaprine (FLEXERIL) 10 MG tablet Take 10 mg by mouth daily as needed for muscle spasms.    Yes [provider]  Fluticasone-Salmeterol (ADVAIR) 250-50 MCG/DOSE AEPB Inhale 1 puff into the lungs 2 (two) times daily as needed (for shortness of breath).    Yes [provider]  furosemide (LASIX) 80 MG tablet Take 1 tablet (80 mg total) by mouth daily. 03/17/18  Yes Wieting, Richard, MD  lidocaine-prilocaine (EMLA) cream Apply 1 application topically as needed (prior to treatment).    Yes [provider]  omeprazole (PRILOSEC) 20 MG capsule Take 20 mg by mouth 2 (two) times daily before a meal.   Yes [provider]  oxybutynin (DITROPAN XL) 15 MG 24 hr tablet Take 15 mg by mouth daily. 12/24/17  Yes [provider]  pravastatin (  PRAVACHOL) 40 MG tablet Take 40 mg by mouth at bedtime.    Yes [provider]  senna (SENOKOT) 8.6 MG tablet Take 1 tablet by mouth daily.   Yes [provider]  topiramate (TOPAMAX) 25 MG tablet Take 25 mg by mouth as needed (for headaches).    Yes [provider]  docusate sodium (COLACE) 100 MG capsule Take 100 mg by mouth 3 (three) times daily as needed for mild constipation.    [provider]    Review of Systems  Constitutional: Positive for fatigue. Negative for appetite change.  HENT: Positive for congestion. Negative for postnasal drip and sore throat.   Eyes: Negative.   Respiratory: Positive for chest tightness. Negative for shortness of breath.   Cardiovascular: Negative for chest pain, palpitations and leg swelling.  Gastrointestinal: Negative for abdominal distention and abdominal pain.  Endocrine: Negative.   Genitourinary: Negative.   Musculoskeletal: Positive for neck pain. Negative for back pain.  Skin: Negative.   Allergic/Immunologic: Negative.   Neurological: Negative for dizziness and light-headedness.  Hematological: Negative for  adenopathy. Does not bruise/bleed easily.  Psychiatric/Behavioral: Negative for dysphoric mood and sleep disturbance (sleeping in recliner due to difficulty getting in her tall bed). The patient is not nervous/anxious.    Vitals:   01/13/19 1254  BP: (!) 157/57  Pulse: 76  Resp: 18  SpO2: 100%  Weight: 133 lb 2 oz (60.4 kg)  Height: 4\' 10"  (1.473 m)   Wt Readings from Last 3 Encounters:  01/13/19 133 lb 2 oz (60.4 kg)  12/21/18 132 lb 4.4 oz (60 kg)  11/29/18 132 lb 4.4 oz (60 kg)   Lab Results  Component Value Date   CREATININE 3.65 (H) 12/21/2018   CREATININE 3.86 (H) 11/29/2018   CREATININE 6.85 (H) 11/28/2018    Physical Exam Vitals signs and nursing note reviewed.  Constitutional:      Appearance: Normal appearance.  HENT:     Head: Normocephalic and atraumatic.  Neck:     Musculoskeletal: Normal range of motion and neck supple.  Cardiovascular:     Rate and Rhythm: Normal rate and regular rhythm.  Pulmonary:     Effort: Pulmonary effort is normal.     Breath sounds: Normal breath sounds. No rales.  Abdominal:     General: There is no distension.     Palpations: Abdomen is soft.  Musculoskeletal:        General: No swelling or tenderness.  Skin:    General: Skin is warm and dry.  Neurological:     General: No focal deficit present.     Mental Status: She is alert and oriented to person, place, and time.  Psychiatric:        Mood and Affect: Mood normal.        Behavior: Behavior normal.    Assessment & Plan:  1: Chronic heart failure with preserved ejection fraction- - NYHA class III - euvolemic today - weighing daily and she was reminded to call for an overnight weight gain of >2 pounds or a weekly weight gain of >5 pounds - wight stable from last visit here 2 months ago - not adding salt but hasn't been reading food labels much. Reviewed the importance of closely following a 2000mg  sodium diet  - she says that she's received her flu vaccine for this  season - saw cardiology Rockey Situ) 12/12/17 - BNP 11/28/2018 was 1283.0  2: HTN- - BP mildly elevated today - saw PCP Vidal Schwalbe) 10/30/18 -  BMP from 12/21/2018 reviewed and showed sodium 135, potassium 3.1, creatinine 3.65 and GFR 12  3: ESRD- - dialysis on Tuesday and Saturday - missed dialysis on Saturday due to the weather; scheduled for tomorrow - saw vascular 10/16/18  Patient did not bring her medications nor a list. Each medication was verbally reviewed with the patient and she was encouraged to bring the bottles to every visit to confirm accuracy of list.  Return in 6 months or sooner for any questions/problems before then.

## 2019-01-13 NOTE — Progress Notes (Signed)
[place] - PHARMACIST COUNSELING NOTE  ADHERENCE ASSESSMENT  Adherence strategy: pill box   Do you ever forget to take your medication? [x] Yes (1) [] No (0)  Do you ever skip doses due to side effects? [] Yes (1) [x] No (0)  Do you have trouble affording your medicines? [] Yes (1) [x] No (0)  Are you ever unable to pick up your medication due to transportation difficulties? [] Yes (1) [x] No (0)  Do you ever stop taking your medications because you don't believe they are helping? [] Yes (1) [x] No (0)  Total score _1______    Recommendations given to patient about increasing adherence: Patient reports good adherence to her medications and rarely misses doses.  Guideline-Directed Medical Therapy/Evidence Based Medicine    ACE/ARB/ARNI: none (HFpEF)   Beta Blocker: carvedilol 12.5 mg twice daily   Aldosterone Antagonist: none (HFpEF) Diuretic: furosemide 80 mg daily    SUBJECTIVE   HPI: In the ED earlier this month for chest pain. Admitted 10/21/18 for acute on chronic HF. Here today for a follow up visit.  Past Medical History:  Diagnosis Date  . Allergy   . Anemia of chronic disease   . Anxiety   . Aortic atherosclerosis ([place])   . Asthma   . CHF (congestive heart failure) ([place])   . Chronic back pain   . COPD (chronic obstructive pulmonary disease) ([place])   . Diabetes mellitus with complication ([place])   . ESRD on hemodialysis ([place])    a. Tues/Sat; b. 2/2 small kidneys  . Essential hypertension   . Fistula    lower left arm  . GERD (gastroesophageal reflux disease)   . Gout   . History of echocardiogram    a. TTE 01/2014: nl LV sys fxn, no valvular abnormalities; b. TTE 11/16: nl EF, mild LVH  . History of exercise stress test    a. 01/2014: no evidence of ischemia; b. Lexiscan 08/2015: no sig ischemia, severe GI uptake artifact, low risk; c. CPET @ [facility] 09/2016: exercised 3 min 12 sec on bike without incline, 2.28 METs, VO2 of 8.1,  48% of predicted, indicating mod to sev functional impairment, evidence of blunted HR, stroke volume, and BP augmentation as well as ventilation-perfusion mismatch with exercise  . HLD (hyperlipidemia)   . 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 Renal insufficiency 09/24/2017   Dialysis patient.  . Sleep apnea         OBJECTIVE    Vital signs: HR 76, BP 157/57, weight (pounds) 133.2 lb  ECHO: Date 12/27/17, EF 55-60%   BMP Latest Ref Rng & Units 12/21/2018 11/29/2018 11/28/2018  Glucose 70 - 99 mg/dL 121(H) 136(H) 199(H)  BUN 8 - 23 mg/dL 17 18 44(H)  Creatinine 0.44 - 1.00 mg/dL 3.65(H) 3.86(H) 6.85(H)  Sodium 135 - 145 mmol/L 135 139 137  Potassium 3.5 - 5.1 mmol/L 3.1(L) 4.2 7.5(HH)  Chloride 98 - 111 mmol/L 96(L) 100 100  CO2 22 - 32 mmol/L 31 29 24   Calcium 8.9 - 10.3 mg/dL 8.8(L) 9.1 8.7(L)    ASSESSMENT 73 year old female with HFpEF. She is on dialysis Tuesday, Thursday and Saturday. She reports that she missed dialysis on Saturday because there was snow on her car and she was unable to drive. Her BP today is elevated, which may be a result of missing dialysis. She is scheduled to go tomorrow. She does not report any issues with any of her medications and rarely misses doses.  She does not weigh herself but they do monitor her weight with each dialysis session.   PLAN Encouraged patient to continue monitoring her weight and sodium intake.   Time spent: 10 minutes  [place], Pharm.D. 01/13/2019 2:33 PM    Current Outpatient Medications:  .  albuterol (PROVENTIL HFA;VENTOLIN HFA) 108 (90 Base) MCG/ACT inhaler, Inhale 2 puffs into the lungs every 6 (six) hours as needed for wheezing or shortness of breath., Disp: , Rfl:  .  amLODipine (NORVASC) 10 MG tablet, Take 10 mg by mouth. , Disp: , Rfl:  .  aspirin EC 81 MG EC tablet, Take 1 tablet (81 mg total) by mouth daily., Disp: , Rfl:  .  calcium acetate (PHOSLO) 667 MG capsule, Take 667 mg by mouth  3 (three) times daily with meals. , Disp: , Rfl:  .  carvedilol (COREG) 12.5 MG tablet, Take 12.5 mg by mouth 2 (two) times daily with a meal. , Disp: , Rfl:  .  cetirizine (ZYRTEC) 10 MG tablet, Take 10 mg by mouth daily. , Disp: , Rfl:  .  clopidogrel (PLAVIX) 75 MG tablet, Take 1 tablet (75 mg total) by mouth daily., Disp: 30 tablet, Rfl: 0 .  cyclobenzaprine (FLEXERIL) 10 MG tablet, Take 10 mg by mouth daily as needed for muscle spasms. , Disp: , Rfl:  .  docusate sodium (COLACE) 100 MG capsule, Take 100 mg by mouth 3 (three) times daily as needed for mild constipation., Disp: , Rfl:  .  Fluticasone-Salmeterol (ADVAIR) 250-50 MCG/DOSE AEPB, Inhale 1 puff into the lungs 2 (two) times daily as needed (for shortness of breath). , Disp: , Rfl:  .  furosemide (LASIX) 80 MG tablet, Take 1 tablet (80 mg total) by mouth daily., Disp: 30 tablet, Rfl: 0 .  lidocaine-prilocaine (EMLA) cream, Apply 1 application topically as needed (prior to treatment). , Disp: , Rfl:  .  omeprazole (PRILOSEC) 20 MG capsule, Take 20 mg by mouth 2 (two) times daily before a meal., Disp: , Rfl:  .  oxybutynin (DITROPAN XL) 15 MG 24 hr tablet, Take 15 mg by mouth daily., Disp: , Rfl:  .  pravastatin (PRAVACHOL) 40 MG tablet, Take 40 mg by mouth at bedtime. , Disp: , Rfl:  .  senna (SENOKOT) 8.6 MG tablet, Take 1 tablet by mouth daily., Disp: , Rfl:  .  topiramate (TOPAMAX) 25 MG tablet, Take 25 mg by mouth as needed (for headaches). , Disp: , Rfl:    COUNSELING POINTS/CLINICAL PEARLS  Carvedilol (Goal: weight less than 85 kg is 25 mg BID, weight greater than 85 kg is 50 mg BID)  Patient should avoid activities requiring coordination until drug effects are realized, as drug may cause dizziness.  This drug may cause diarrhea, nausea, vomiting, arthralgia, back pain, myalgia, headache, vision disorder, erectile dysfunction, reduced libido, or fatigue.  Instruct patient to report signs/symptoms of adverse cardiovascular effects  such as hypotension (especially in elderly patients), arrhythmias, syncope, palpitations, angina, or edema.  Drug may mask symptoms of hypoglycemia. Advise diabetic patients to carefully monitor blood sugar levels.  Patient should take drug with food.  Advise patient against sudden discontinuation of drug. Furosemide  Drug causes sun-sensitivity. Advise patient to use sunscreen and avoid tanning beds. Patient should avoid activities requiring coordination until drug effects are realized, as drug may cause dizziness, vertigo, or blurred vision. This drug may cause hyperglycemia, hyperuricemia, constipation, diarrhea, loss of appetite, nausea, vomiting, purpuric disorder, cramps, spasticity, asthenia, headache, paresthesia, or scaling  eczema. Instruct patient to report unusual bleeding/bruising or signs/symptoms of hypotension, infection, pancreatitis, or ototoxicity (tinnitus, hearing impairment). Advise patient to report signs/symptoms of a severe skin reactions (flu-like symptoms, spreading red rash, or skin/mucous membrane blistering) or erythema multiforme. Instruct patient to eat high-potassium foods during drug therapy, as directed by healthcare professional.  Patient should not drink alcohol while taking this drug.   DRUGS TO AVOID IN HEART FAILURE  Drug or Class Mechanism  Analgesics . NSAIDs . COX-2 inhibitors . Glucocorticoids  Sodium and water retention, increased systemic vascular resistance, decreased response to diuretics   Diabetes Medications . Metformin . Thiazolidinediones o Rosiglitazone (Avandia) o Pioglitazone (Actos) . DPP4 Inhibitors o Saxagliptin (Onglyza) o Sitagliptin (Januvia)   Lactic acidosis Possible calcium channel blockade   Unknown  Antiarrhythmics . Class I  o Flecainide o Disopyramide . Class III o Sotalol . Other o Dronedarone  Negative inotrope, proarrhythmic   Proarrhythmic, beta blockade  Negative inotrope   Antihypertensives . Alpha Blockers o Doxazosin . Calcium Channel Blockers o Diltiazem o Verapamil o Nifedipine . Central Alpha Adrenergics o Moxonidine . Peripheral Vasodilators o Minoxidil  Increases renin and aldosterone  Negative inotrope    Possible sympathetic withdrawal  Unknown  Anti-infective . Itraconazole . Amphotericin B  Negative inotrope Unknown  Hematologic . Anagrelide . Cilostazol   Possible inhibition of PD IV Inhibition of PD III causing arrhythmias  Neurologic/Psychiatric . Stimulants . Anti-Seizure Drugs o Carbamazepine o Pregabalin . Antidepressants o Tricyclics o Citalopram . Parkinsons o Bromocriptine o Pergolide o Pramipexole . Antipsychotics o Clozapine . Antimigraine o Ergotamine o Methysergide . Appetite suppressants . Bipolar o Lithium  Peripheral alpha and beta agonist activity  Negative inotrope and chronotrope Calcium channel blockade  Negative inotrope, proarrhythmic Dose-dependent QT prolongation  Excessive serotonin activity/valvular damage Excessive serotonin activity/valvular damage Unknown  IgE mediated hypersensitivy, calcium channel blockade  Excessive serotonin activity/valvular damage Excessive serotonin activity/valvular damage Valvular damage  Direct myofibrillar degeneration, adrenergic stimulation  Antimalarials . Chloroquine . Hydroxychloroquine Intracellular inhibition of lysosomal enzymes  Urologic Agents . Alpha Blockers o Doxazosin o Prazosin o Tamsulosin o Terazosin  Increased renin and aldosterone  Adapted from Page RL, et al. "Drugs That May Cause or Exacerbate Heart Failure: A Scientific Statement from the [place]." Circulation 2016; 450:T88-E28. DOI: 10.1161/CIR.0000000000000426   MEDICATION ADHERENCES TIPS AND STRATEGIES 1. Taking medication as prescribed improves patient outcomes in heart failure (reduces hospitalizations, improves symptoms, increases  survival) 2. Side effects of medications can be managed by decreasing doses, switching agents, stopping drugs, or adding additional therapy. Please let someone in the [facility] know if you have having bothersome side effects so we can modify your regimen. Do not alter your medication regimen without talking to [place].  3. Medication reminders can help patients remember to take drugs on time. If you are missing or forgetting doses you can try linking behaviors, using pill boxes, or an electronic reminder like an alarm on your phone or an app. Some people can also get automated phone calls as medication reminders.

## 2019-01-14 ENCOUNTER — Encounter: Payer: Self-pay | Admitting: Family

## 2019-04-17 ENCOUNTER — Encounter (INDEPENDENT_AMBULATORY_CARE_PROVIDER_SITE_OTHER): Payer: Medicare Other

## 2019-04-17 ENCOUNTER — Ambulatory Visit (INDEPENDENT_AMBULATORY_CARE_PROVIDER_SITE_OTHER): Payer: Medicare Other | Admitting: Nurse Practitioner

## 2019-05-04 ENCOUNTER — Emergency Department
Admission: EM | Admit: 2019-05-04 | Discharge: 2019-05-04 | Disposition: A | Discharge status: Home / Self Care | Payer: Medicare Other | Source: Home / Self Care | Attending: Emergency Medicine | Admitting: Emergency Medicine

## 2019-05-04 ENCOUNTER — Emergency Department: Payer: Medicare Other

## 2019-05-04 ENCOUNTER — Encounter: Payer: Self-pay | Admitting: Emergency Medicine

## 2019-05-04 ENCOUNTER — Inpatient Hospital Stay
Admission: EM | Admit: 2019-05-04 | Discharge: 2019-05-08 | DRG: 205 | Disposition: A | Discharge status: Home Health | Payer: Medicare Other | Attending: Internal Medicine | Admitting: Internal Medicine

## 2019-05-04 ENCOUNTER — Other Ambulatory Visit: Payer: Self-pay

## 2019-05-04 DIAGNOSIS — Z7982 Long term (current) use of aspirin: Secondary | ICD-10-CM

## 2019-05-04 DIAGNOSIS — R0600 Dyspnea, unspecified: Secondary | ICD-10-CM

## 2019-05-04 DIAGNOSIS — G8929 Other chronic pain: Secondary | ICD-10-CM | POA: Diagnosis present

## 2019-05-04 DIAGNOSIS — R05 Cough: Secondary | ICD-10-CM | POA: Insufficient documentation

## 2019-05-04 DIAGNOSIS — M109 Gout, unspecified: Secondary | ICD-10-CM | POA: Diagnosis present

## 2019-05-04 DIAGNOSIS — E1122 Type 2 diabetes mellitus with diabetic chronic kidney disease: Secondary | ICD-10-CM | POA: Diagnosis present

## 2019-05-04 DIAGNOSIS — I10 Essential (primary) hypertension: Secondary | ICD-10-CM | POA: Diagnosis present

## 2019-05-04 DIAGNOSIS — G4733 Obstructive sleep apnea (adult) (pediatric): Secondary | ICD-10-CM | POA: Diagnosis present

## 2019-05-04 DIAGNOSIS — I350 Nonrheumatic aortic (valve) stenosis: Secondary | ICD-10-CM | POA: Diagnosis present

## 2019-05-04 DIAGNOSIS — Z7951 Long term (current) use of inhaled steroids: Secondary | ICD-10-CM

## 2019-05-04 DIAGNOSIS — D631 Anemia in chronic kidney disease: Secondary | ICD-10-CM | POA: Diagnosis present

## 2019-05-04 DIAGNOSIS — M940 Chondrocostal junction syndrome [Tietze]: Principal | ICD-10-CM | POA: Diagnosis present

## 2019-05-04 DIAGNOSIS — R0602 Shortness of breath: Secondary | ICD-10-CM | POA: Diagnosis not present

## 2019-05-04 DIAGNOSIS — E1169 Type 2 diabetes mellitus with other specified complication: Secondary | ICD-10-CM | POA: Diagnosis present

## 2019-05-04 DIAGNOSIS — I132 Hypertensive heart and chronic kidney disease with heart failure and with stage 5 chronic kidney disease, or end stage renal disease: Secondary | ICD-10-CM | POA: Diagnosis present

## 2019-05-04 DIAGNOSIS — N3281 Overactive bladder: Secondary | ICD-10-CM | POA: Diagnosis present

## 2019-05-04 DIAGNOSIS — N2581 Secondary hyperparathyroidism of renal origin: Secondary | ICD-10-CM | POA: Diagnosis present

## 2019-05-04 DIAGNOSIS — J449 Chronic obstructive pulmonary disease, unspecified: Secondary | ICD-10-CM | POA: Diagnosis present

## 2019-05-04 DIAGNOSIS — E785 Hyperlipidemia, unspecified: Secondary | ICD-10-CM | POA: Diagnosis present

## 2019-05-04 DIAGNOSIS — Z992 Dependence on renal dialysis: Secondary | ICD-10-CM | POA: Insufficient documentation

## 2019-05-04 DIAGNOSIS — N186 End stage renal disease: Secondary | ICD-10-CM | POA: Insufficient documentation

## 2019-05-04 DIAGNOSIS — I6523 Occlusion and stenosis of bilateral carotid arteries: Secondary | ICD-10-CM | POA: Diagnosis present

## 2019-05-04 DIAGNOSIS — R0789 Other chest pain: Secondary | ICD-10-CM

## 2019-05-04 DIAGNOSIS — R079 Chest pain, unspecified: Secondary | ICD-10-CM | POA: Diagnosis present

## 2019-05-04 DIAGNOSIS — K219 Gastro-esophageal reflux disease without esophagitis: Secondary | ICD-10-CM | POA: Diagnosis present

## 2019-05-04 DIAGNOSIS — I5032 Chronic diastolic (congestive) heart failure: Secondary | ICD-10-CM | POA: Diagnosis present

## 2019-05-04 DIAGNOSIS — E119 Type 2 diabetes mellitus without complications: Secondary | ICD-10-CM | POA: Insufficient documentation

## 2019-05-04 DIAGNOSIS — I120 Hypertensive chronic kidney disease with stage 5 chronic kidney disease or end stage renal disease: Secondary | ICD-10-CM | POA: Insufficient documentation

## 2019-05-04 DIAGNOSIS — Z7902 Long term (current) use of antithrombotics/antiplatelets: Secondary | ICD-10-CM

## 2019-05-04 DIAGNOSIS — Z1159 Encounter for screening for other viral diseases: Secondary | ICD-10-CM

## 2019-05-04 DIAGNOSIS — Z8673 Personal history of transient ischemic attack (TIA), and cerebral infarction without residual deficits: Secondary | ICD-10-CM

## 2019-05-04 DIAGNOSIS — I2510 Atherosclerotic heart disease of native coronary artery without angina pectoris: Secondary | ICD-10-CM | POA: Diagnosis present

## 2019-05-04 DIAGNOSIS — I5033 Acute on chronic diastolic (congestive) heart failure: Secondary | ICD-10-CM | POA: Diagnosis not present

## 2019-05-04 DIAGNOSIS — Z79899 Other long term (current) drug therapy: Secondary | ICD-10-CM

## 2019-05-04 LAB — CBC WITH DIFFERENTIAL/PLATELET
Abs Immature Granulocytes: 0.02 10*3/uL (ref 0.00–0.07)
Basophils Absolute: 0 10*3/uL (ref 0.0–0.1)
Basophils Relative: 1 %
Eosinophils Absolute: 0.4 10*3/uL (ref 0.0–0.5)
Eosinophils Relative: 6 %
HCT: 29.9 % — ABNORMAL LOW (ref 36.0–46.0)
Hemoglobin: 9.8 g/dL — ABNORMAL LOW (ref 12.0–15.0)
Immature Granulocytes: 0 %
Lymphocytes Relative: 14 %
Lymphs Abs: 0.8 10*3/uL (ref 0.7–4.0)
MCH: 29.3 pg (ref 26.0–34.0)
MCHC: 32.8 g/dL (ref 30.0–36.0)
MCV: 89.5 fL (ref 80.0–100.0)
Monocytes Absolute: 0.5 10*3/uL (ref 0.1–1.0)
Monocytes Relative: 9 %
Neutro Abs: 4.2 10*3/uL (ref 1.7–7.7)
Neutrophils Relative %: 70 %
Platelets: 182 10*3/uL (ref 150–400)
RBC: 3.34 MIL/uL — ABNORMAL LOW (ref 3.87–5.11)
RDW: 13.4 % (ref 11.5–15.5)
WBC: 6 10*3/uL (ref 4.0–10.5)
nRBC: 0 % (ref 0.0–0.2)

## 2019-05-04 LAB — CBC
HCT: 29.3 % — ABNORMAL LOW (ref 36.0–46.0)
Hemoglobin: 9.5 g/dL — ABNORMAL LOW (ref 12.0–15.0)
MCH: 29.6 pg (ref 26.0–34.0)
MCHC: 32.4 g/dL (ref 30.0–36.0)
MCV: 91.3 fL (ref 80.0–100.0)
Platelets: 164 10*3/uL (ref 150–400)
RBC: 3.21 MIL/uL — ABNORMAL LOW (ref 3.87–5.11)
RDW: 13.5 % (ref 11.5–15.5)
WBC: 6.5 10*3/uL (ref 4.0–10.5)
nRBC: 0 % (ref 0.0–0.2)

## 2019-05-04 LAB — BASIC METABOLIC PANEL
Anion gap: 12 (ref 5–15)
Anion gap: 15 (ref 5–15)
BUN: 26 mg/dL — ABNORMAL HIGH (ref 8–23)
BUN: 30 mg/dL — ABNORMAL HIGH (ref 8–23)
CO2: 30 mmol/L (ref 22–32)
CO2: 28 mmol/L (ref 22–32)
Calcium: 9 mg/dL (ref 8.9–10.3)
Calcium: 9.1 mg/dL (ref 8.9–10.3)
Chloride: 99 mmol/L (ref 98–111)
Chloride: 99 mmol/L (ref 98–111)
Creatinine, Ser: 4.85 mg/dL — ABNORMAL HIGH (ref 0.44–1.00)
Creatinine, Ser: 5.35 mg/dL — ABNORMAL HIGH (ref 0.44–1.00)
GFR calc Af Amer: 10 mL/min — ABNORMAL LOW (ref >60)
GFR calc Af Amer: 9 mL/min — ABNORMAL LOW (ref >60)
GFR calc non Af Amer: 8 mL/min — ABNORMAL LOW (ref >60)
GFR calc non Af Amer: 7 mL/min — ABNORMAL LOW (ref >60)
Glucose, Bld: 107 mg/dL — ABNORMAL HIGH (ref 70–99)
Glucose, Bld: 152 mg/dL — ABNORMAL HIGH (ref 70–99)
Potassium: 3.7 mmol/L (ref 3.5–5.1)
Potassium: 3.6 mmol/L (ref 3.5–5.1)
Sodium: 141 mmol/L (ref 135–145)
Sodium: 142 mmol/L (ref 135–145)

## 2019-05-04 LAB — TROPONIN I
Troponin I: 0.03 ng/mL (ref <0.03)
Troponin I: 0.03 ng/mL (ref <0.03)

## 2019-05-04 MED ORDER — ALBUTEROL SULFATE (2.5 MG/3ML) 0.083% IN NEBU: 2.5000 mg | INHALATION_SOLUTION | Freq: Four times a day (QID) | RESPIRATORY_TRACT | 1 refills | Status: DC | PRN

## 2019-05-04 MED ORDER — IPRATROPIUM-ALBUTEROL 0.5-2.5 (3) MG/3ML IN SOLN
3.0000 mL | Freq: Once | RESPIRATORY_TRACT | Status: AC
  Administered 2019-05-04: 3 mL via RESPIRATORY_TRACT
  Filled 2019-05-04: qty 3

## 2019-05-04 MED ORDER — ACETAMINOPHEN 500 MG PO TABS
ORAL_TABLET | ORAL | Status: AC
  Administered 2019-05-05: 1000 mg via ORAL
  Filled 2019-05-04: qty 2

## 2019-05-04 MED ORDER — ACETAMINOPHEN 325 MG PO TABS: 650.0000 mg | ORAL_TABLET | Freq: Once | ORAL | Status: DC

## 2019-05-04 MED ORDER — OXYCODONE-ACETAMINOPHEN 5-325 MG PO TABS
1.0000 | ORAL_TABLET | Freq: Once | ORAL | Status: AC
  Administered 2019-05-04: 1 via ORAL
  Filled 2019-05-04: qty 1

## 2019-05-04 NOTE — ED Provider Notes (Signed)
[facility] Emergency Department Provider Note  Time seen: 10:41 PM  I have reviewed the triage vital signs and the nursing notes.   HISTORY  Chief Complaint Shortness of Breath   HPI Phyllis [name] is a 73 y.o. female the past medical history of anemia, CHF, COPD, ESRD on hemodialysis, gastric reflux, presents to the emergency department for shortness of breath and chest pain.  Patient was seen in the emergency department earlier today around 3 PM for the same symptoms.  Patient had a negative work-up at that time and was ultimately discharged home.  Patient states she has continued to have chest pain ever since going home however she developed shortness of breath once again became very short of breath so she called EMS.  Per EMS upon their arrival patient had a room air saturation of 80% with a good waveform.  Patient was brought to the emergency department however upon arrival to the emergency department patient was taken off oxygen and was satting 96% on room air.  Patient continues to feel short of breath, continues to have chest pain which she states feels like her typical costochondritis which she has been dealing with for many years per patient.  Describes her chest pain as mild dull pain.  Describes her shortness of breath is moderate.   Past Medical History:  Diagnosis Date  . Allergy   . Anemia of chronic disease   . Anxiety   . Aortic atherosclerosis ([place])   . Asthma   . CHF (congestive heart failure) ([place])   . Chronic back pain   . COPD (chronic obstructive pulmonary disease) ([place])   . Diabetes mellitus with complication ([place])   . ESRD on hemodialysis ([place])    a. Tues/Sat; b. 2/2 small kidneys  . Essential hypertension   . Fistula    lower left arm  . GERD (gastroesophageal reflux disease)   . Gout   . History of echocardiogram    a. TTE 01/2014: nl LV sys fxn, no valvular abnormalities; b. TTE 11/16: nl EF, mild LVH  . History of exercise  stress test    a. 01/2014: no evidence of ischemia; b. Lexiscan 08/2015: no sig ischemia, severe GI uptake artifact, low risk; c. CPET @ [facility] 09/2016: exercised 3 min 12 sec on bike without incline, 2.28 METs, VO2 of 8.1, 48% of predicted, indicating mod to sev functional impairment, evidence of blunted HR, stroke volume, and BP augmentation as well as ventilation-perfusion mismatch with exercise  . HLD (hyperlipidemia)   . 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 Renal insufficiency 09/24/2017   Dialysis patient.  . Sleep apnea     Patient Active Problem List   Diagnosis Date Noted  . Hyperkalemia 11/29/2018  . Chronic diastolic heart failure ([place]) 11/02/2018  . HTN (hypertension) 11/02/2018  . Accelerated hypertension 10/21/2018  . Pressure injury of skin 03/16/2018  . CVA (cerebral vascular accident) ([place]) 01/16/2018  . Aortic atherosclerosis ([place]) 12/11/2017  . Chest pain 09/18/2017  . Hyperlipidemia 08/06/2015  . Complication from renal dialysis device 04/12/2015  . End stage renal disease on dialysis ([place]) 02/01/2015  . Type 2 diabetes mellitus with other specified complication ([place]) 78/93/8101  . Asthma 02/01/2015  . Acute on chronic diastolic CHF (congestive heart failure) ([place]) 02/01/2015    Past Surgical History:  Procedure Laterality Date  . carpel tunnel    . GALLBLADDER SURGERY    . PERIPHERAL VASCULAR CATHETERIZATION N/A 04/12/2015  Procedure: A/V Shuntogram/Fistulagram;  Surgeon: Algernon Huxley, MD;  Location: [facility];  Service: Cardiovascular;  Laterality: N/A;  . PERIPHERAL VASCULAR CATHETERIZATION N/A 04/12/2015   Procedure: A/V Shunt Intervention;  Surgeon: Algernon Huxley, MD;  Location: [facility];  Service: Cardiovascular;  Laterality: N/A;  . PERIPHERAL VASCULAR CATHETERIZATION N/A 06/09/2015   Procedure: Dialysis/Perma Catheter Removal;  Surgeon: Katha Cabal, MD;  Location: [facility];  Service:  Cardiovascular;  Laterality: N/A;    Prior to Admission medications   Medication Sig Start Date End Date Taking? Authorizing Provider  albuterol (PROVENTIL HFA;VENTOLIN HFA) 108 (90 Base) MCG/ACT inhaler Inhale 2 puffs into the lungs every 6 (six) hours as needed for wheezing or shortness of breath.    [provider]  albuterol (PROVENTIL) (2.5 MG/3ML) 0.083% nebulizer solution Take 3 mLs (2.5 mg total) by nebulization every 6 (six) hours as needed for wheezing or shortness of breath. 05/04/19   Nance Pear, MD  amLODipine (NORVASC) 10 MG tablet Take 10 mg by mouth.  12/29/13   [provider]  aspirin EC 81 MG EC tablet Take 1 tablet (81 mg total) by mouth daily. 09/20/17   Bettey Costa, MD  calcium acetate (PHOSLO) 667 MG capsule Take 667 mg by mouth 3 (three) times daily with meals.     [provider]  carvedilol (COREG) 12.5 MG tablet Take 12.5 mg by mouth 2 (two) times daily with a meal.  01/09/14   [provider]  cetirizine (ZYRTEC) 10 MG tablet Take 10 mg by mouth daily.     [provider]  clopidogrel (PLAVIX) 75 MG tablet Take 1 tablet (75 mg total) by mouth daily. 01/18/18   Epifanio Lesches, MD  cyclobenzaprine (FLEXERIL) 10 MG tablet Take 10 mg by mouth daily as needed for muscle spasms.     [provider]  Fluticasone-Salmeterol (ADVAIR) 250-50 MCG/DOSE AEPB Inhale 1 puff into the lungs 2 (two) times daily as needed (for shortness of breath).     [provider]  furosemide (LASIX) 80 MG tablet Take 1 tablet (80 mg total) by mouth daily. 03/17/18   Loletha Grayer, MD  hydrALAZINE (APRESOLINE) 25 MG tablet Take 25 mg by mouth 3 (three) times daily. 03/25/19   [provider]  HYDROcodone-acetaminophen (NORCO) 7.5-325 MG tablet TAKE 1 TO 2 TABLETS BY MOUTH EVERY DAY AS NEEDED 04/09/19   [provider]  lidocaine-prilocaine (EMLA) cream Apply 1 application topically as needed (prior to treatment).      [provider]  losartan (COZAAR) 50 MG tablet Take 50 mg by mouth daily. 03/18/19   [provider]  omeprazole (PRILOSEC) 20 MG capsule Take 20 mg by mouth 2 (two) times daily before a meal.    [provider]  oxybutynin (DITROPAN XL) 15 MG 24 hr tablet Take 15 mg by mouth daily. 12/24/17   [provider]  pravastatin (PRAVACHOL) 40 MG tablet Take 40 mg by mouth at bedtime.     [provider]  topiramate (TOPAMAX) 25 MG tablet Take 25 mg by mouth as needed (for headaches).     [provider]    Allergies  Allergen Reactions  . Codeine Nausea And Vomiting and Nausea Only  . Enalapril Maleate     Other reaction(s): Headache  . Nitrofurantoin Swelling and Rash    Other Reaction: swelling of body  . Sulfamethoxazole-Trimethoprim Swelling  . Vicodin [Hydrocodone-Acetaminophen] Nausea And Vomiting and Nausea Only  .  2,4-D Dimethylamine (Amisol) Rash    Other Reaction: h/a  . Baclofen Other (See Comments) and Nausea Only    lightheadness ,drowsiness , muscle weakness , twitching in hands   . Neosporin [Neomycin-Bacitracin Zn-Polymyx] Other (See Comments) and Rash    Other Reaction: irritation Skin irritation   . Quinine Rash    Other Reaction: Vomiting rash, h/a, vision  . Ultram [Tramadol] Palpitations  . Zocor [Simvastatin] Other (See Comments) and Rash    Other Reaction: muscle spasms Muscle pain and spasms  . Bactrim [Sulfamethoxazole-Trimethoprim] Swelling  . Levodopa   . Macrodantin [Nitrofurantoin Macrocrystal] Swelling  . Quinine Derivatives Other (See Comments)    Vertigo,nausea vomiting blurred vision headache ears sensitivity   . Vasotec [Enalapril] Other (See Comments)    Headaches     Family History  Problem Relation Age of Onset  . Hypertension Mother   . Hyperlipidemia Mother   . Heart disease Father   . Heart attack Father 20  . Hypertension Father   . Hyperlipidemia Father   . Heart disease Brother         CABG   . Heart attack Brother   . Breast cancer Neg Hx     Social History Social History   Tobacco Use  . Smoking status: Never Smoker  . Smokeless tobacco: Never Used  Substance Use Topics  . Alcohol use: No  . Drug use: No    Review of Systems Constitutional: Negative for fever. Cardiovascular: Mild chest pain Respiratory: As of her shortness of breath.  Negative for cough. Gastrointestinal: Negative for abdominal pain, vomiting Musculoskeletal: Negative for musculoskeletal complaints Skin: Negative for skin complaints  Neurological: Negative for headache All other ROS negative  ____________________________________________   PHYSICAL EXAM:  VITAL SIGNS: ED Triage Vitals  Enc Vitals Group     BP 05/04/19 2159 (!) 181/59     Pulse Rate 05/04/19 2152 92     Resp 05/04/19 2159 20     Temp 05/04/19 2159 99.1 F (37.3 C)     Temp Source 05/04/19 2159 Oral     SpO2 05/04/19 2152 93 %     Weight 05/04/19 2154 130 lb 1.1 oz (59 kg)     Height 05/04/19 2154 4\' 10"  (1.473 m)     Head Circumference --      Peak Flow --      Pain Score 05/04/19 2153 5     Pain Loc --      Pain Edu? --      Excl. in [place]? --     Constitutional: Alert and oriented. Well appearing and in no distress. Eyes: Normal exam ENT      Head: Normocephalic and atraumatic      Mouth/Throat: Mucous membranes are moist. Cardiovascular: Normal rate, regular rhythm.  2/6 systolic murmur. Respiratory: Normal respiratory effort without tachypnea nor retractions. Breath sounds are clear  Gastrointestinal: Soft and nontender. No distention.   Musculoskeletal: Nontender with normal range of motion in all extremities.  Neurologic:  Normal speech and language. No gross focal neurologic deficits  Skin:  Skin is warm, dry and intact.  Psychiatric: Mood and affect are normal.   ____________________________________________    EKG  EKG viewed and interpreted by myself shows a normal sinus rhythm at  88 bpm with a narrow QRS, normal axis, normal intervals, no concerning ST changes.  ____________________________________________   INITIAL IMPRESSION / ASSESSMENT AND PLAN / ED COURSE  Pertinent labs & imaging results that were available during my  care of the patient were reviewed by me and considered in my medical decision making (see chart for details).   Patient presents emergency department for continued chest pain and now with shortness of breath, 80% room air saturation per EMS.  Currently the patient appears well, satting 96% on room air, borderline low-grade temperature 99.1.  Patient is hypertensive, she has end-stage renal disease on hemodialysis.  Patient's chest x-ray from earlier today is clear however differential this time would include fluid overload, ACS, anxiety, mucous plugging.  We will check labs, swab for corona as a precaution and continue to closely monitor.  Labs are pending however patient states she does not feel comfortable going home, given the patient is on dialysis with an 80% room air saturation recorded by EMS I believe she would warrant overnight admission.  Patient agreeable to plan of care.  Arsenia Goracke Rago was evaluated in Emergency Department on 05/04/2019 for the symptoms described in the history of present illness. She was evaluated in the context of the global COVID-19 pandemic, which necessitated consideration that the patient might be at risk for infection with the SARS-CoV-2 virus that causes COVID-19. Institutional protocols and algorithms that pertain to the evaluation of patients at risk for COVID-19 are in a state of rapid change based on information released by regulatory bodies including the CDC and federal and state organizations. These policies and algorithms were followed during the patient's care in the ED.  ____________________________________________   FINAL CLINICAL IMPRESSION(S) / ED DIAGNOSES  Chest pain Dyspnea   Harvest Dark, MD 05/04/19 2244

## 2019-05-04 NOTE — ED Notes (Signed)
Pt has multiple allergies but states she can take percocet.

## 2019-05-04 NOTE — ED Triage Notes (Signed)
Pt states out of her zyrtec and having sinus troubles today

## 2019-05-04 NOTE — ED Notes (Signed)
Dr Kerman Passey notified of troponin level- no new orders at this time

## 2019-05-04 NOTE — Discharge Instructions (Addendum)
Please seek medical attention for any high fevers, chest pain, shortness of breath, change in behavior, persistent vomiting, bloody stool or any other new or concerning symptoms.  

## 2019-05-04 NOTE — ED Triage Notes (Signed)
Pt arrives via EMS after having increased SHOB from when she was here earlier- she was given a neb treatment and sent home- fire dept found her at 80% RA- EMS had her on 6L [place] satting at 95-96%- pt states she feels like she is wheezing- no auditory wheeze noted

## 2019-05-04 NOTE — ED Notes (Signed)
Pt up and ambulated to the toilet after given a walker to use. Pt has steady gait and needed no assistance.

## 2019-05-04 NOTE — ED Notes (Signed)
Pt states her pain is 4/10 and would like something for it. Pt is not to take ibuprofen d/t dialysis.

## 2019-05-04 NOTE — ED Notes (Signed)
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 Patient given pillow and remote. Patient declined a warm blanket.

## 2019-05-04 NOTE — ED Notes (Signed)
Troponin 0.03- will notify Dr Kerman Passey

## 2019-05-04 NOTE — ED Provider Notes (Signed)
[facility] Emergency Department Provider Note   ____________________________________________   I have reviewed the triage vital signs and the nursing notes.   HISTORY  Chief Complaint Shortness of Breath   History limited by: Not Limited   HPI Phyllis [name] is a 73 y.o. female who presents to the emergency department today with complaint of shortness of breath in the setting of chest pain. She states that the pain is located in her central chest. She says it is the same pain that she has been having for the past ten years on and off. She has been told its costochondritis. This current episode started 4 days ago. She did take one of her pain pills earlier in the day which did help with the pain. She does get some associated shortness of breath with this pain which she had again today. Did undergo dialysis yesterday. She denies any productive cough. Denies any fevers.    Records reviewed. Per medical record review patient has a history of COPD, CHF, ESRD on dialysis.   Past Medical History:  Diagnosis Date  . Allergy   . Anemia of chronic disease   . Anxiety   . Aortic atherosclerosis ([place])   . Asthma   . CHF (congestive heart failure) ([place])   . Chronic back pain   . COPD (chronic obstructive pulmonary disease) ([place])   . Diabetes mellitus with complication ([place])   . ESRD on hemodialysis ([place])    a. Tues/Sat; b. 2/2 small kidneys  . Essential hypertension   . Fistula    lower left arm  . GERD (gastroesophageal reflux disease)   . Gout   . History of echocardiogram    a. TTE 01/2014: nl LV sys fxn, no valvular abnormalities; b. TTE 11/16: nl EF, mild LVH  . History of exercise stress test    a. 01/2014: no evidence of ischemia; b. Lexiscan 08/2015: no sig ischemia, severe GI uptake artifact, low risk; c. CPET @ [facility] 09/2016: exercised 3 min 12 sec on bike without incline, 2.28 METs, VO2 of 8.1, 48% of predicted, indicating mod to sev functional  impairment, evidence of blunted HR, stroke volume, and BP augmentation as well as ventilation-perfusion mismatch with exercise  . HLD (hyperlipidemia)   . 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 Renal insufficiency 09/24/2017   Dialysis patient.  . Sleep apnea     Patient Active Problem List   Diagnosis Date Noted  . Hyperkalemia 11/29/2018  . Chronic diastolic heart failure ([place]) 11/02/2018  . HTN (hypertension) 11/02/2018  . Accelerated hypertension 10/21/2018  . Pressure injury of skin 03/16/2018  . CVA (cerebral vascular accident) ([place]) 01/16/2018  . Aortic atherosclerosis ([place]) 12/11/2017  . Chest pain 09/18/2017  . Hyperlipidemia 08/06/2015  . Complication from renal dialysis device 04/12/2015  . End stage renal disease on dialysis ([place]) 02/01/2015  . Type 2 diabetes mellitus with other specified complication ([place]) 62/13/0865  . Asthma 02/01/2015  . Acute on chronic diastolic CHF (congestive heart failure) ([place]) 02/01/2015    Past Surgical History:  Procedure Laterality Date  . carpel tunnel    . GALLBLADDER SURGERY    . PERIPHERAL VASCULAR CATHETERIZATION N/A 04/12/2015   Procedure: A/V Shuntogram/Fistulagram;  Surgeon: Algernon Huxley, MD;  Location: [place] [facility];  Service: Cardiovascular;  Laterality: N/A;  . PERIPHERAL VASCULAR CATHETERIZATION N/A 04/12/2015   Procedure: A/V Shunt Intervention;  Surgeon: Algernon Huxley, MD;  Location: [place] [facility];  Service: Cardiovascular;  Laterality: N/A;  . PERIPHERAL VASCULAR CATHETERIZATION N/A 06/09/2015   Procedure: Dialysis/Perma Catheter Removal;  Surgeon: Katha Cabal, MD;  Location: [place] [facility];  Service: Cardiovascular;  Laterality: N/A;    Prior to Admission medications   Medication Sig Start Date End Date Taking? Authorizing Provider  albuterol (PROVENTIL HFA;VENTOLIN HFA) 108 (90 Base) MCG/ACT inhaler Inhale 2 puffs into the lungs every 6 (six) hours as needed for wheezing or  shortness of breath.    [provider]  amLODipine (NORVASC) 10 MG tablet Take 10 mg by mouth.  12/29/13   [provider]  aspirin EC 81 MG EC tablet Take 1 tablet (81 mg total) by mouth daily. 09/20/17   Bettey Costa, MD  calcium acetate (PHOSLO) 667 MG capsule Take 667 mg by mouth 3 (three) times daily with meals.     [provider]  carvedilol (COREG) 12.5 MG tablet Take 12.5 mg by mouth 2 (two) times daily with a meal.  01/09/14   [provider]  cetirizine (ZYRTEC) 10 MG tablet Take 10 mg by mouth daily.     [provider]  clopidogrel (PLAVIX) 75 MG tablet Take 1 tablet (75 mg total) by mouth daily. 01/18/18   Epifanio Lesches, MD  cyclobenzaprine (FLEXERIL) 10 MG tablet Take 10 mg by mouth daily as needed for muscle spasms.     [provider]  docusate sodium (COLACE) 100 MG capsule Take 100 mg by mouth 3 (three) times daily as needed for mild constipation.    [provider]  Fluticasone-Salmeterol (ADVAIR) 250-50 MCG/DOSE AEPB Inhale 1 puff into the lungs 2 (two) times daily as needed (for shortness of breath).     [provider]  furosemide (LASIX) 80 MG tablet Take 1 tablet (80 mg total) by mouth daily. 03/17/18   Loletha Grayer, MD  lidocaine-prilocaine (EMLA) cream Apply 1 application topically as needed (prior to treatment).     [provider]  omeprazole (PRILOSEC) 20 MG capsule Take 20 mg by mouth 2 (two) times daily before a meal.    [provider]  oxybutynin (DITROPAN XL) 15 MG 24 hr tablet Take 15 mg by mouth daily. 12/24/17   [provider]  pravastatin (PRAVACHOL) 40 MG tablet Take 40 mg by mouth at bedtime.     [provider]  senna (SENOKOT) 8.6 MG tablet Take 1 tablet by mouth daily.    [provider]  topiramate (TOPAMAX) 25 MG tablet Take 25 mg by mouth as needed (for headaches).     [provider]    Allergies Codeine; Enalapril  maleate; Nitrofurantoin; Sulfamethoxazole-trimethoprim; Vicodin [hydrocodone-acetaminophen]; 2,4-d dimethylamine (amisol); Baclofen; Neosporin [neomycin-bacitracin zn-polymyx]; Quinine; Ultram [tramadol]; Zocor [simvastatin]; Bactrim [sulfamethoxazole-trimethoprim]; Levodopa; Macrodantin [nitrofurantoin macrocrystal]; Quinine derivatives; and Vasotec [enalapril]  Family History  Problem Relation Age of Onset  . Hypertension Mother   . Hyperlipidemia Mother   . Heart disease Father   . Heart attack Father 4  . Hypertension Father   . Hyperlipidemia Father   . Heart disease Brother        CABG   . Heart attack Brother   . Breast cancer Neg Hx     Social History Social History   Tobacco Use  . Smoking status: Never Smoker  . Smokeless tobacco: Never Used  Substance Use Topics  . Alcohol use: No  . Drug use: No    Review of Systems Constitutional: No fever/chills Eyes: No visual changes. ENT: Positive  for sinus congestion.  Cardiovascular: Positive for chest pain. Respiratory: Positive for shortness of breath. Gastrointestinal: No abdominal pain.  No nausea, no vomiting.  No diarrhea.   Genitourinary: Negative for dysuria. Musculoskeletal: Negative for back pain. Skin: Negative for rash. Neurological: Negative for headaches, focal weakness or numbness.  ____________________________________________   PHYSICAL EXAM:  VITAL SIGNS: ED Triage Vitals  Enc Vitals Group     BP 05/04/19 1451 (!) 171/80     Pulse Rate 05/04/19 1451 77     Resp 05/04/19 1451 18     Temp 05/04/19 1451 99.5 F (37.5 C)     Temp Source 05/04/19 1451 Oral     SpO2 05/04/19 1451 99 %     Weight 05/04/19 1452 130 lb 1.1 oz (59 kg)     Height 05/04/19 1452 4\' 10"  (1.473 m)     Head Circumference --      Peak Flow --      Pain Score 05/04/19 1452 2   Constitutional: Alert and oriented.  Eyes: Conjunctivae are normal.  ENT      Head: Normocephalic and atraumatic.      Nose: No  congestion/rhinnorhea.      Mouth/Throat: Mucous membranes are moist.      Neck: No stridor. Hematological/Lymphatic/Immunilogical: No cervical lymphadenopathy. Cardiovascular: Normal rate, regular rhythm.  Systolic murmur.  Respiratory: Normal respiratory effort without tachypnea nor retractions. Breath sounds are clear and equal bilaterally. No wheezes/rales/rhonchi. Gastrointestinal: Soft and non tender. No rebound. No guarding.  Genitourinary: Deferred Musculoskeletal: Normal range of motion in all extremities. No lower extremity edema. Neurologic:  Normal speech and language. No gross focal neurologic deficits are appreciated.  Skin:  Skin is warm, dry and intact. No rash noted. Psychiatric: Mood and affect are normal. Speech and behavior are normal. Patient exhibits appropriate insight and judgment.  ____________________________________________    LABS (pertinent positives/negatives)  Trop 0.03 BMP na 141, k 3.7, glu 107, cr 4.85 CBC wbc 6.0, hgb 9.8, plt 182  ____________________________________________   EKG  I, Nance Pear, attending physician, personally viewed and interpreted this EKG  EKG Time: 1454 Rate: 76 Rhythm: sinus rhythm Axis: normal Intervals: qtc 491 QRS: narrow ST changes: no st elevation Impression: abnormal ekg  ____________________________________________    RADIOLOGY  CXR No acute abnormality  ____________________________________________   PROCEDURES  Procedures  ____________________________________________   INITIAL IMPRESSION / ASSESSMENT AND PLAN / ED COURSE  Pertinent labs & imaging results that were available during my care of the patient were reviewed by me and considered in my medical decision making (see chart for details).   Patient presented to the emergency department today because of concerns for chest pain and shortness of breath.  She states that this feels like her typical costochondritis.  Patient's troponin  was very minimally elevated at 0.03 but patient peers to have some baseline elevation likely secondary to dialysis.  Do not think ACS is likely given the fact that the patient does not have any significant elevation and the pain is been going on for the past few days.  Patient did feel better with her shortness of breath after breathing treatment.  She states she is out of her nebulizer solution.  She feels comfortable going home at this time and I think that is reasonable.  ____________________________________________   FINAL CLINICAL IMPRESSION(S) / ED DIAGNOSES  Final diagnoses:  SOB (shortness of breath)  Chest wall pain     Note: This dictation was prepared with Dragon dictation. Any transcriptional  errors that result from this process are unintentional     Nance Pear, MD 05/04/19 1711

## 2019-05-04 NOTE — ED Triage Notes (Signed)
Pt  States sob x 5 days. Is a dialysis pt. Tried nasocort and proair with no relief. States has costochrondritis so "gets like this on ocassion"

## 2019-05-05 ENCOUNTER — Other Ambulatory Visit: Payer: Self-pay

## 2019-05-05 DIAGNOSIS — R0602 Shortness of breath: Secondary | ICD-10-CM

## 2019-05-05 DIAGNOSIS — R079 Chest pain, unspecified: Secondary | ICD-10-CM

## 2019-05-05 LAB — CBC
HCT: 26.5 % — ABNORMAL LOW (ref 36.0–46.0)
Hemoglobin: 8.6 g/dL — ABNORMAL LOW (ref 12.0–15.0)
MCH: 30 pg (ref 26.0–34.0)
MCHC: 32.5 g/dL (ref 30.0–36.0)
MCV: 92.3 fL (ref 80.0–100.0)
Platelets: 117 10*3/uL — ABNORMAL LOW (ref 150–400)
RBC: 2.87 MIL/uL — ABNORMAL LOW (ref 3.87–5.11)
RDW: 13.3 % (ref 11.5–15.5)
WBC: 5.8 10*3/uL (ref 4.0–10.5)
nRBC: 0.3 % — ABNORMAL HIGH (ref 0.0–0.2)

## 2019-05-05 LAB — GLUCOSE, CAPILLARY
Glucose-Capillary: 71 mg/dL (ref 70–99)
Glucose-Capillary: 115 mg/dL — ABNORMAL HIGH (ref 70–99)
Glucose-Capillary: 111 mg/dL — ABNORMAL HIGH (ref 70–99)
Glucose-Capillary: 136 mg/dL — ABNORMAL HIGH (ref 70–99)

## 2019-05-05 LAB — CREATININE, SERUM
Creatinine, Ser: 5.78 mg/dL — ABNORMAL HIGH (ref 0.44–1.00)
GFR calc Af Amer: 8 mL/min — ABNORMAL LOW (ref >60)
GFR calc non Af Amer: 7 mL/min — ABNORMAL LOW (ref >60)

## 2019-05-05 LAB — TROPONIN I
Troponin I: 0.05 ng/mL (ref <0.03)
Troponin I: 0.04 ng/mL (ref <0.03)

## 2019-05-05 LAB — SARS CORONAVIRUS 2 BY RT PCR (HOSPITAL ORDER, PERFORMED IN ~~LOC~~ HOSPITAL LAB): SARS Coronavirus 2: NEGATIVE

## 2019-05-05 MED ORDER — INSULIN ASPART 100 UNIT/ML ~~LOC~~ SOLN
0.0000 [IU] | Freq: Three times a day (TID) | SUBCUTANEOUS | Status: DC
  Administered 2019-05-07: 1 [IU] via SUBCUTANEOUS
  Administered 2019-05-07: 5 [IU] via SUBCUTANEOUS
  Administered 2019-05-07 – 2019-05-08 (×2): 1 [IU] via SUBCUTANEOUS
  Filled 2019-05-05 (×4): qty 1

## 2019-05-05 MED ORDER — HYDRALAZINE HCL 25 MG PO TABS
25.0000 mg | ORAL_TABLET | Freq: Three times a day (TID) | ORAL | Status: DC
  Administered 2019-05-05 – 2019-05-08 (×9): 25 mg via ORAL
  Filled 2019-05-05 (×9): qty 1

## 2019-05-05 MED ORDER — FUROSEMIDE 40 MG PO TABS
80.0000 mg | ORAL_TABLET | Freq: Every day | ORAL | Status: DC
  Administered 2019-05-05 – 2019-05-08 (×4): 80 mg via ORAL
  Filled 2019-05-05 (×4): qty 2

## 2019-05-05 MED ORDER — ACETAMINOPHEN 500 MG PO TABS
1000.0000 mg | ORAL_TABLET | Freq: Once | ORAL | Status: AC
  Administered 2019-05-05: 1000 mg via ORAL

## 2019-05-05 MED ORDER — LABETALOL HCL 5 MG/ML IV SOLN
10.0000 mg | INTRAVENOUS | Status: DC | PRN
  Administered 2019-05-05: 10 mg via INTRAVENOUS
  Filled 2019-05-05: qty 4

## 2019-05-05 MED ORDER — ACETAMINOPHEN 650 MG RE SUPP: 650.0000 mg | Freq: Four times a day (QID) | RECTAL | Status: DC | PRN

## 2019-05-05 MED ORDER — ASPIRIN EC 81 MG PO TBEC
81.0000 mg | DELAYED_RELEASE_TABLET | Freq: Every day | ORAL | Status: DC
  Administered 2019-05-05: 81 mg via ORAL
  Filled 2019-05-05: qty 1

## 2019-05-05 MED ORDER — HYDROCODONE-ACETAMINOPHEN 7.5-325 MG PO TABS: 1.0000 | ORAL_TABLET | Freq: Every day | ORAL | Status: DC | PRN

## 2019-05-05 MED ORDER — PANTOPRAZOLE SODIUM 20 MG PO TBEC
20.0000 mg | DELAYED_RELEASE_TABLET | Freq: Two times a day (BID) | ORAL | Status: DC
  Administered 2019-05-05 – 2019-05-08 (×8): 20 mg via ORAL
  Filled 2019-05-05 (×8): qty 1

## 2019-05-05 MED ORDER — OXYBUTYNIN CHLORIDE ER 5 MG PO TB24
15.0000 mg | ORAL_TABLET | Freq: Every day | ORAL | Status: DC
  Administered 2019-05-05 – 2019-05-08 (×4): 15 mg via ORAL
  Filled 2019-05-05 (×4): qty 3

## 2019-05-05 MED ORDER — ONDANSETRON HCL 4 MG PO TABS: 4.0000 mg | ORAL_TABLET | Freq: Four times a day (QID) | ORAL | Status: DC | PRN

## 2019-05-05 MED ORDER — HYDROCODONE-ACETAMINOPHEN 7.5-325 MG PO TABS
1.0000 | ORAL_TABLET | Freq: Two times a day (BID) | ORAL | Status: DC | PRN
  Administered 2019-05-05 – 2019-05-08 (×5): 1 via ORAL
  Filled 2019-05-05 (×5): qty 1

## 2019-05-05 MED ORDER — ONDANSETRON HCL 4 MG/2ML IJ SOLN
4.0000 mg | Freq: Four times a day (QID) | INTRAMUSCULAR | Status: DC | PRN
  Administered 2019-05-06: 4 mg via INTRAVENOUS
  Filled 2019-05-05: qty 2

## 2019-05-05 MED ORDER — INSULIN ASPART 100 UNIT/ML ~~LOC~~ SOLN
0.0000 [IU] | Freq: Every day | SUBCUTANEOUS | Status: DC
  Administered 2019-05-06: 2 [IU] via SUBCUTANEOUS
  Filled 2019-05-05: qty 1

## 2019-05-05 MED ORDER — MOMETASONE FURO-FORMOTEROL FUM 200-5 MCG/ACT IN AERO
2.0000 | INHALATION_SPRAY | Freq: Two times a day (BID) | RESPIRATORY_TRACT | Status: DC
  Administered 2019-05-05 – 2019-05-08 (×7): 2 via RESPIRATORY_TRACT
  Filled 2019-05-05: qty 8.8

## 2019-05-05 MED ORDER — ASPIRIN EC 81 MG PO TBEC: 81.0000 mg | DELAYED_RELEASE_TABLET | Freq: Every day | ORAL | Status: DC

## 2019-05-05 MED ORDER — NITROGLYCERIN 2 % TD OINT
1.0000 [in_us] | TOPICAL_OINTMENT | Freq: Once | TRANSDERMAL | Status: AC
  Administered 2019-05-06: 1 [in_us] via TOPICAL
  Filled 2019-05-05: qty 1

## 2019-05-05 MED ORDER — ACETAMINOPHEN 325 MG PO TABS
650.0000 mg | ORAL_TABLET | Freq: Four times a day (QID) | ORAL | Status: DC | PRN
  Administered 2019-05-05 – 2019-05-06 (×3): 650 mg via ORAL
  Filled 2019-05-05 (×3): qty 2

## 2019-05-05 MED ORDER — LOSARTAN POTASSIUM 50 MG PO TABS
50.0000 mg | ORAL_TABLET | Freq: Every day | ORAL | Status: DC
  Administered 2019-05-05 – 2019-05-08 (×4): 50 mg via ORAL
  Filled 2019-05-05 (×4): qty 1

## 2019-05-05 MED ORDER — HEPARIN SODIUM (PORCINE) 5000 UNIT/ML IJ SOLN
5000.0000 [IU] | Freq: Three times a day (TID) | INTRAMUSCULAR | Status: DC
  Administered 2019-05-05 – 2019-05-08 (×8): 5000 [IU] via SUBCUTANEOUS
  Filled 2019-05-05 (×8): qty 1

## 2019-05-05 MED ORDER — PRAVASTATIN SODIUM 40 MG PO TABS
40.0000 mg | ORAL_TABLET | Freq: Every day | ORAL | Status: DC
  Administered 2019-05-05 – 2019-05-07 (×4): 40 mg via ORAL
  Filled 2019-05-05 (×4): qty 1

## 2019-05-05 MED ORDER — CARVEDILOL 12.5 MG PO TABS
12.5000 mg | ORAL_TABLET | Freq: Two times a day (BID) | ORAL | Status: DC
  Administered 2019-05-05 – 2019-05-08 (×6): 12.5 mg via ORAL
  Filled 2019-05-05 (×7): qty 1

## 2019-05-05 MED ORDER — SODIUM CHLORIDE 0.9% FLUSH
3.0000 mL | Freq: Two times a day (BID) | INTRAVENOUS | Status: DC
  Administered 2019-05-05 – 2019-05-08 (×7): 3 mL via INTRAVENOUS

## 2019-05-05 MED ORDER — CLOPIDOGREL BISULFATE 75 MG PO TABS
75.0000 mg | ORAL_TABLET | Freq: Every day | ORAL | Status: DC
  Administered 2019-05-05 – 2019-05-08 (×4): 75 mg via ORAL
  Filled 2019-05-05 (×5): qty 1

## 2019-05-05 MED ORDER — CALCIUM ACETATE (PHOS BINDER) 667 MG PO CAPS
1334.0000 mg | ORAL_CAPSULE | Freq: Three times a day (TID) | ORAL | Status: DC
  Administered 2019-05-05 – 2019-05-08 (×8): 1334 mg via ORAL
  Filled 2019-05-05 (×10): qty 2

## 2019-05-05 MED ORDER — AMLODIPINE BESYLATE 10 MG PO TABS
10.0000 mg | ORAL_TABLET | Freq: Every day | ORAL | Status: DC
  Administered 2019-05-05 – 2019-05-08 (×4): 10 mg via ORAL
  Filled 2019-05-05 (×4): qty 1

## 2019-05-05 MED ORDER — LABETALOL HCL 5 MG/ML IV SOLN
10.0000 mg | INTRAVENOUS | Status: DC | PRN
  Administered 2019-05-05 – 2019-05-08 (×2): 10 mg via INTRAVENOUS
  Filled 2019-05-05 (×2): qty 4

## 2019-05-05 NOTE — Consult Note (Signed)
Cardiology Consultation:   Patient ID: [name] MRN: [id number]; DOB: 05-30-1946  Admit date: 05/04/2019 Date of Consult: 05/05/2019  Primary Care Provider: [name], MD Primary Cardiologist: Ida Rogue, MD  Primary Electrophysiologist:  None    Patient Profile:   Phyllis [name] is a 73 y.o. 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 Jannifer Franklin.  History of Present Illness:   Phyllis [name] is a 73 yo female with PMH as above. On 10/06/2016, she underwent stress testing at [facility] for which she exercised for 3 minutes and 12 seconds on the bike, without incline, with a peak METS of 2.28.  She achieved VO2 of 8.1, 48% of predicted, indicating moderate to severe functional impairment.  Evidence of blunted heart rate, stroke-volume and BP augmentation as well as ventilation perfusion mismatch with exercise.  EKG showed NSR, isolated PVCs with exercise.  She was seen in 2018 and noted a decline in her functional capacity since suffering a fall in 07/2017 leading to multiple staples being placed on her scalp.  She also reported intermittent, substernal chest pain over the last 1 to 1.5 weeks that was not associated with activities or exertion.  It was noted to be tender to palpation of the anterior chest wall, although this pain was reportedly different in character. The pain improved with deep inspiration and was gone after about 30 to 45 minutes.  She reported she had never experienced pain like this before in the past.  No associated shortness of breath, nausea, vomiting, diaphoresis, palpitations, near-syncope, or syncope.  She did admit to applying extra salt to her foods and eating out at restaurants  once a month.  She drank less than 2 L of fluid daily with volume management mostly through HD. Intermittent / PRN lasix was discussed pending updated echo. Given her intermittent chest pain with moderate risk for cardiac etiology, plan was for Lexiscan Myoview to evaluate for high risk ischemia.  She continued ASA and Coreg. Subsequent 09/2017 stress test showed no significant ischemia with EF estimated at 52% and ruled a lower scan. 12/2017 echo as below showed EF 55-60%, no RWMA, G2DD, mild to moderate AS, mild MS, mild LAE.   On 05/04/2019, she presented to [facility] ED with complaint of shortness of breath and chest pain then discharged home after it was determined low risk with no further cardiac workup needed at that time. After discharge, she noted progressive SOB, which she stated worsened when she raised her arms. She continued to c/o chest pain; however, she described this chest pain as unchanged from "her typical costochondritis." Her most recent costochondritis episode was described as starting on 05/02/19, rated 10/10 in severity, non-pleuritic, non-radiating, and "soreness" that was TTP and improved with ice. She denied associated palpitations, racing HR, nausea, emesis, diaphoresis, syncope, or near syncope. She emphasized several times her main concern was for her SOB, which worsened with raising her arms. No hematuria, melena, BRBPR, or hematochezia reported. In the ED, vitals were significant for elevated BP at 181/59, HR 92, low-grade fever at 99.32F, SpO2 93% on room air (per EMS, reportedly 80%ORA). EKG showed NSR, 88bpm, no acute ST/T changes.  Labs significant for Na 142, K 3.6, Cr 5.35, BUN 30, troponin 0.03  0.05  0.04, Hgb 9.5  8.6, RBC 6.5  5.8. COVID-19 not detected. CXR without acute changes.  Past Medical History:  Diagnosis Date   Allergy    Anemia of chronic disease    Anxiety    Aortic atherosclerosis (HCC)    Asthma    CHF (congestive heart failure) (HCC)    Chronic back  pain    COPD (chronic obstructive pulmonary disease) ([place])    Diabetes mellitus with complication ([place])    ESRD on hemodialysis ([place])    a. Tues/Sat; b. 2/2 small kidneys   Essential hypertension    Fistula    lower left arm   GERD (gastroesophageal reflux disease)    Gout    History of echocardiogram    a. TTE 01/2014: nl LV sys fxn, no valvular abnormalities; b. TTE 11/16: nl EF, mild LVH   History of exercise stress test    a. 01/2014: no evidence of ischemia; b. Lexiscan 08/2015: no sig ischemia, severe GI uptake artifact, low risk; c. 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   Sleep apnea     Past Surgical History:  Procedure Laterality Date   carpel tunnel     GALLBLADDER SURGERY     PERIPHERAL VASCULAR CATHETERIZATION N/A 04/12/2015   Procedure: A/V Shuntogram/Fistulagram;  Surgeon: Algernon Huxley, MD;  Location: [place] [facility];  Service: Cardiovascular;  Laterality: N/A;   PERIPHERAL VASCULAR CATHETERIZATION N/A 04/12/2015   Procedure: A/V Shunt Intervention;  Surgeon: Algernon Huxley, MD;  Location: [place] [facility];  Service: Cardiovascular;  Laterality: N/A;   PERIPHERAL VASCULAR CATHETERIZATION N/A 06/09/2015   Procedure: Dialysis/Perma Catheter Removal;  Surgeon: Katha Cabal, MD;  Location: [place] [facility];  Service: Cardiovascular;  Laterality: N/A;     Home Medications:  Prior to Admission medications   Medication Sig Start Date End Date Taking? Authorizing Provider  albuterol (PROVENTIL HFA;VENTOLIN HFA) 108 (90 Base) MCG/ACT inhaler Inhale 2 puffs into the lungs every 6 (six)  hours as needed for wheezing or shortness of breath.   Yes [provider]  albuterol (PROVENTIL) (2.5 MG/3ML) 0.083% nebulizer solution Take 3 mLs (2.5 mg total) by nebulization every 6 (six) hours as needed for wheezing or shortness of breath. 05/04/19  Yes Nance Pear, MD  amLODipine (NORVASC) 10 MG tablet Take 10 mg by mouth.  12/29/13  Yes [provider]  aspirin EC 81 MG EC tablet Take 1 tablet (81 mg total) by mouth daily. 09/20/17  Yes Mody, Ulice Bold, MD  calcium acetate (PHOSLO) 667 MG capsule Take 667 mg by mouth 3 (three) times daily with meals.    Yes [provider]  carvedilol (COREG) 12.5 MG tablet Take 12.5 mg by mouth 2 (two) times daily with a meal.  01/09/14  Yes [provider]  cetirizine (ZYRTEC) 10 MG tablet Take 10 mg by mouth at bedtime.    Yes [provider]  clopidogrel (PLAVIX) 75 MG tablet Take 1 tablet (75 mg total) by mouth daily. 01/18/18  Yes Epifanio Lesches, MD  cyclobenzaprine (FLEXERIL) 10 MG tablet Take 10 mg by mouth daily as  needed for muscle spasms.    Yes [provider]  Fluticasone-Salmeterol (ADVAIR) 250-50 MCG/DOSE AEPB Inhale 1 puff into the lungs 2 (two) times daily as needed (for shortness of breath).    Yes [provider]  furosemide (LASIX) 80 MG tablet Take 1 tablet (80 mg total) by mouth daily. 03/17/18  Yes Wieting, Richard, MD  hydrALAZINE (APRESOLINE) 25 MG tablet Take 25 mg by mouth 3 (three) times daily. 03/25/19  Yes [provider]  HYDROcodone-acetaminophen (NORCO) 7.5-325 MG tablet Take 1-2 tablets by mouth daily as needed (pain).  04/09/19  Yes [provider]  lidocaine-prilocaine (EMLA) cream Apply 1 application topically as needed (prior to treatment).    Yes [provider]  losartan (COZAAR) 50 MG tablet Take 50 mg by mouth daily. 03/18/19  Yes [provider]  omeprazole (PRILOSEC) 20 MG capsule Take 20 mg by mouth 2 (two) times daily  before a meal.   Yes [provider]  oxybutynin (DITROPAN XL) 15 MG 24 hr tablet Take 15 mg by mouth daily. 12/24/17  Yes [provider]  pravastatin (PRAVACHOL) 40 MG tablet Take 40 mg by mouth at bedtime.    Yes [provider]  topiramate (TOPAMAX) 25 MG tablet Take 25 mg by mouth as needed (for headaches).    Yes [provider]    Inpatient Medications: Scheduled Meds:  amLODipine  10 mg Oral Daily   aspirin EC  81 mg Oral Daily   carvedilol  12.5 mg Oral BID WC   clopidogrel  75 mg Oral Daily   furosemide  80 mg Oral Daily   heparin  5,000 Units Subcutaneous Q8H   hydrALAZINE  25 mg Oral TID   insulin aspart  0-5 Units Subcutaneous QHS   insulin aspart  0-9 Units Subcutaneous TID WC   losartan  50 mg Oral Daily   mometasone-formoterol  2 puff Inhalation BID   oxybutynin  15 mg Oral Daily   pantoprazole  20 mg Oral BID AC   pravastatin  40 mg Oral QHS   sodium chloride flush  3 mL Intravenous Q12H   Continuous Infusions:  PRN Meds: acetaminophen **OR** acetaminophen, HYDROcodone-acetaminophen, labetalol, ondansetron **OR** ondansetron (ZOFRAN) IV  Allergies:    Allergies  Allergen Reactions   Codeine Nausea And Vomiting and Nausea Only   Enalapril Maleate Other (See Comments)    Other reaction(s): Headache   Nitrofurantoin Swelling and Rash    Other Reaction: swelling of body   Sulfamethoxazole-Trimethoprim Swelling   Vicodin [Hydrocodone-Acetaminophen] Nausea And Vomiting and Nausea Only   2,4-D Dimethylamine (Amisol) Rash and Other (See Comments)    Other Reaction: h/a   Baclofen Other (See Comments) and Nausea Only    lightheadness ,drowsiness , muscle weakness , twitching in hands    Neosporin [Neomycin-Bacitracin Zn-Polymyx] Other (See Comments) and Rash    Other Reaction: irritation Skin irritation    Quinine Nausea And Vomiting, Rash and Other (See Comments)    Other Reaction: Vomiting, rash, h/a,  vision   Ultram [Tramadol] Palpitations   Zocor [Simvastatin] Other (See Comments) and Rash    Other Reaction: muscle spasms Muscle pain and spasms   Bactrim [Sulfamethoxazole-Trimethoprim] Swelling   Levodopa Other (See Comments)    Reaction: unknown   Macrodantin [Nitrofurantoin Macrocrystal] Swelling   Quinine Derivatives Other (See Comments)    Vertigo,nausea vomiting blurred vision headache ears sensitivity     Social History:   Social History   Socioeconomic History   Marital status:  Single    Spouse name: Not on file   Number of children: Not on file   Years of education: Not on file   Highest education level: Not on file  Occupational History   Not on file  Social Needs   Financial resource strain: Not on file   Food insecurity    Worry: Not on file    Inability: Not on file   Transportation needs    Medical: Not on file    Non-medical: Not on file  Tobacco Use   Smoking status: Never Smoker   Smokeless tobacco: Never Used  Substance and Sexual Activity   Alcohol use: No   Drug use: No   Sexual activity: Not Currently  Lifestyle   Physical activity    Days per week: Not on file    Minutes per session: Not on file   Stress: Not on file  Relationships   Social connections    Talks on phone: Not on file    Gets together: Not on file    Attends religious service: Not on file    Active member of club or organization: Not on file    Attends meetings of clubs or organizations: Not on file    Relationship status: Not on file   Intimate partner violence    Fear of current or ex partner: Not on file    Emotionally abused: Not on file    Physically abused: Not on file    Forced sexual activity: Not on file  Other Topics Concern   Not on file  Social History Narrative   Not on file    Family History:    Family History  Problem Relation Age of Onset   Hypertension Mother    Hyperlipidemia Mother    Heart disease Father     Heart attack Father 7   Hypertension Father    Hyperlipidemia Father    Heart disease Brother        CABG    Heart attack Brother    Breast cancer Neg Hx      ROS:  Please see the history of present illness.  Review of Systems  Respiratory: Positive for shortness of breath. Negative for cough.        "SOB when I lift my arms"  Cardiovascular: Positive for chest pain. Negative for palpitations and leg swelling.       Non-radiating, non-pleuritic CP, currently 3/10 and ongoing since Friday 6/12. TTP and improved with ice. Unchanged from known costochondritis   Gastrointestinal: Negative for abdominal pain, blood in stool, constipation, diarrhea, melena, nausea and vomiting.  Genitourinary: Negative for dysuria and hematuria.  Musculoskeletal: Negative for falls.  Neurological: Negative for dizziness and loss of consciousness.  All other systems reviewed and are negative.   All other ROS reviewed and negative.     Physical Exam/Data:   Vitals:   05/05/19 0216 05/05/19 0407 05/05/19 0600 05/05/19 0752  BP: (!) 165/50 (!) 161/59 (!) 158/54 (!) 163/64  Pulse: 70 76 68 78  Resp:  20  18  Temp:  98.3 F (36.8 C)  98.4 F (36.9 C)  TempSrc:    Oral  SpO2: 100% 99%  100%  Weight:      Height:        Intake/Output Summary (Last 24 hours) at 05/05/2019 1256 Last data filed at 05/05/2019 0939 Gross per 24 hour  Intake 243 ml  Output 150 ml  Net 93 ml   [employer]  05/04/19 2154 05/05/19 0200  Weight: 59 kg 58.5 kg   Body mass index is 26.96 kg/m.  General:  Well nourished, well developed, in no acute distress. Elderly female lying in bed HEENT: normal. [place] oxygen Neck: no JVD Vascular: Radial pulses 2+ bilaterally  Cardiac:  normal S1, S2; RRR; 2/6 systolic murmur. Anterior chest TTP Lungs:  clear to auscultation bilaterally, no wheezing, rhonchi or rales  Abd: soft, nontender, no hepatomegaly  Ext: no b/l lower extremity edema Musculoskeletal:  No deformities,  BUE and BLE strength normal and equal Skin: warm and dry  Neuro:  CNs 2-12 intact, no focal abnormalities noted Psych:  Normal affect   EKG:  The EKG was personally reviewed and demonstrates:  NSR, 76bpm, no acute ST/T changes Telemetry:  Telemetry was personally reviewed and demonstrates: SR, 70-80bpm  Relevant CV Studies: TTE  12/27/2017 - Left ventricle: The cavity size was normal. Systolic function was   normal. The estimated ejection fraction was in the range of 55%   to 60%. Wall motion was normal; there were no regional wall   motion abnormalities. Features are consistent with a pseudonormal   left ventricular filling pattern, with concomitant abnormal   relaxation and increased filling pressure (grade 2 diastolic   dysfunction). - Aortic valve: Transvalvular velocity was increased. There was   mild to moderate stenosis. Peak velocity (S): 305 cm/s. Mean   gradient (S): 20 mm Hg. Peak gradient (S): 37 mm Hg. - Mitral valve: Calcified annulus. Mildly thickened leaflets . The   findings are consistent with mild stenosis. Valve area by   continuity equation (using LVOT flow): 1.42 cm^2. - Left atrium: The atrium was mildly dilated. - Right ventricle: Systolic function was normal. - Pulmonary arteries: Systolic pressure was within the normal   range.  Stress 09/25/2017 There was no ST segment deviation noted during stress.  No T wave inversion was noted during stress.  Arrhythmias during stress:  none.   Arrhythmias during recovery:  none.     There were no significant arrhythmias noted during the test.   ECG was interpretable and there was no significant change from baseline.   Laboratory Data:  Chemistry Recent Labs  Lab 05/04/19 1450 05/04/19 2201 05/05/19 0434  NA 141 142  --   K 3.7 3.6  --   CL 99 99  --   CO2 30 28  --   GLUCOSE 107* 152*  --   BUN 26* 30*  --   CREATININE 4.85* 5.35* 5.78*  CALCIUM 9.0 9.1  --   GFRNONAA 8* 7* 7*  GFRAA 10* 9* 8*    ANIONGAP 12 15  --     No results for input(s): PROT, ALBUMIN, AST, ALT, ALKPHOS, BILITOT in the last 168 hours. Hematology Recent Labs  Lab 05/04/19 1450 05/04/19 2201 05/05/19 0434  WBC 6.0 6.5 5.8  RBC 3.34* 3.21* 2.87*  HGB 9.8* 9.5* 8.6*  HCT 29.9* 29.3* 26.5*  MCV 89.5 91.3 92.3  MCH 29.3 29.6 30.0  MCHC 32.8 32.4 32.5  RDW 13.4 13.5 13.3  PLT 182 164 117*   Cardiac Enzymes Recent Labs  Lab 05/04/19 1450 05/04/19 2201 05/05/19 0434 05/05/19 1026  TROPONINI 0.03* 0.03* 0.05* 0.04*   No results for input(s): TROPIPOC in the last 168 hours.  BNPNo results for input(s): BNP, PROBNP in the last 168 hours.  DDimer No results for input(s): DDIMER in the last 168 hours.  Radiology/Studies:  Dg Chest 2 View  Result Date: 05/04/2019  CLINICAL DATA:  Patient reports two episodes of SOB. Reports first episode onset 5 days ago and second episode onset last night. Reports most recent episode has not resolved yet. Patient believes SOB is a result of costochondritis. Hx GERD, HTN, ESRD, DM, COPD, CHF, asthma. Non-smoker. EXAM: CHEST - 2 VIEW COMPARISON:  12/21/2018 FINDINGS: Cardiac silhouette is top-normal in size. No mediastinal or hilar masses. No evidence of adenopathy. There are thickened bilateral bronchovascular markings and mild interstitial thickening, similar to the prior exam. No evidence of pneumonia or pulmonary edema. No pleural effusion or pneumothorax. Skeletal structures are demineralized but intact. IMPRESSION: No acute cardiopulmonary disease. Electronically Signed   By: Lajean Manes M.D.   On: 05/04/2019 15:52    Assessment and Plan:   Elevated troponin with h/o chronic atypical chest pain --Chronic atypical chest pain and unchanged from pain associated with costochondritis. Current pain 3/10. Presented to ED for second time on 6/14 due to increased SOB with lifting arms.Previous 2018 stress ruled low risk as in HPI. Consider anemia as contributing to SOB as below.   --Low suspicion for for cardiac ischemia at this time given atypical nature of CP and as consistent with and unchanged from previous chronic costochondritis episodes with low risk stress test. Consider SOB d/t worsening aortic stenosis / valvular disease with repeat echo as indicated to monitor progression. Consider also SOB d/t worsening anemia. --EKG without acute changes. Troponin flat trending and minimally elevated with peak 0.05, already down trending.  --Suspect Tn elevation due to supply demand ischemia in setting of anemia, ESRD on HD, uncontrolled HTN, and elevated temperature. --No indication for cardiac catheterization or further ischemic workup at this time. Continue medication management and risk factor control. No indication for heparin gtt. Will defer management of DAPT with ASA and Plavix per neuro as below.  Anemia --Hgb 9.5  8.6, RBC 3.21   2.8.  --Known anemia of chronic disease with recent drop in Hgb/RBC. Continue daily CBC. Management of DAPT with ASA and Plavix, started at 01/2018 admission for concern for CVA, per neurology. --Recommend transfuse below 8.0, per IM / nephrology.   Bilateral carotid stenosis --12/2017 carotid ultrasound after v/o visual loss showed b/l mild atherosclerosis of carotids. No hemodynamically significant ICA stenosis.  --DAPT with ASA and Plavix, started at 01/2018 admission for concern for CVA with CT head imaging negative for acute changes and carotid ultrasound showing mild bilateral carotid stenosis.  --ASA and Plavix managment per neurology with consideration of Hgb as above.  HTN - Continue medication management and titrate as HR allows.  ESRD - HD per nephrology.   For questions or updates, please contact [place] Please consult [email or web address] for contact info under     Signed, Arvil Chaco, PA-C  05/05/2019 12:56 PM

## 2019-05-05 NOTE — ED Notes (Addendum)
Pt placed on 2L for comfort. Pt became shob after ambulating with assistance to bedside toilet. Pts sats were 95% after ambulating.

## 2019-05-05 NOTE — Progress Notes (Signed)
Established hemodialysis patient known at [facility] TTS 11:00.

## 2019-05-05 NOTE — Plan of Care (Signed)
Pt arrived to unit from ED this shift. Alert and oriented x 4. Compliant with medications. Denied any pain or discomfort when asked. Dyspnea observed with exertion. Pt 100 % on O2 @ 2L but refused to take it off when asked stating she feels that she needs it. Fall precautions initiated. Will continue to monitor.   Problem: Education: Goal: Knowledge of General Education information will improve Description: Including pain rating scale, medication(s)/side effects and non-pharmacologic comfort measures Outcome: Progressing   Problem: Clinical Measurements: Goal: Ability to maintain clinical measurements within normal limits will improve Outcome: Progressing Goal: Will remain free from infection Outcome: Progressing Goal: Respiratory complications will improve Outcome: Progressing   Problem: Coping: Goal: Level of anxiety will decrease Outcome: Progressing   Problem: Pain Managment: Goal: General experience of comfort will improve Outcome: Progressing   Problem: Safety: Goal: Ability to remain free from injury will improve Outcome: Progressing

## 2019-05-05 NOTE — ED Notes (Signed)
ED TO INPATIENT HANDOFF REPORT  ED Nurse Name and Phone #: [phone] [phone]  S Name/Age/Gender Phyllis [name] Lape 74 y.o. female Room/Bed: ED06A/ED06A  Code Status   Code Status: Prior  Home/SNF/Other Home Patient oriented to: self, place, time and situation Is this baseline? Yes   Triage Complete: Triage complete  Chief Complaint SOB  Triage Note Pt arrives via EMS after having increased SHOB from when she was here earlier- she was given a neb treatment and sent home- fire dept found her at 80% RA- EMS had her on 6L [place] satting at 95-96%- pt states she feels like she is wheezing- no auditory wheeze noted   Allergies Allergies  Allergen Reactions  . Codeine Nausea And Vomiting and Nausea Only  . Enalapril Maleate Other (See Comments)    Other reaction(s): Headache  . Nitrofurantoin Swelling and Rash    Other Reaction: swelling of body  . Sulfamethoxazole-Trimethoprim Swelling  . Vicodin [Hydrocodone-Acetaminophen] Nausea And Vomiting and Nausea Only  . 2,4-D Dimethylamine (Amisol) Rash and Other (See Comments)    Other Reaction: h/a  . Baclofen Other (See Comments) and Nausea Only    lightheadness ,drowsiness , muscle weakness , twitching in hands   . Neosporin [Neomycin-Bacitracin Zn-Polymyx] Other (See Comments) and Rash    Other Reaction: irritation Skin irritation   . Quinine Nausea And Vomiting, Rash and Other (See Comments)    Other Reaction: Vomiting, rash, h/a, vision  . Ultram [Tramadol] Palpitations  . Zocor [Simvastatin] Other (See Comments) and Rash    Other Reaction: muscle spasms Muscle pain and spasms  . Bactrim [Sulfamethoxazole-Trimethoprim] Swelling  . Levodopa Other (See Comments)    Reaction: unknown  . Macrodantin [Nitrofurantoin Macrocrystal] Swelling  . Quinine Derivatives Other (See Comments)    Vertigo,nausea vomiting blurred vision headache ears sensitivity     Level of Care/Admitting Diagnosis ED Disposition    ED Disposition  Condition [facility]: [place] [[id number]]  Level of Care: Telemetry [5]  Covid Evaluation: Confirmed COVID Negative  Diagnosis: Chest pain [119417]  Admitting Physician: Lance Coon [[id number]]  Attending Physician: Lance Coon [[id number]]  Bed request comments: 2a  PT Class (Do Not Modify): Observation [104]  PT Acc Code (Do Not Modify): Observation [10022]       B Medical/Surgery History Past Medical History:  Diagnosis Date  . Allergy   . Anemia of chronic disease   . Anxiety   . Aortic atherosclerosis ([place])   . Asthma   . CHF (congestive heart failure) ([place])   . Chronic back pain   . COPD (chronic obstructive pulmonary disease) ([place])   . Diabetes mellitus with complication ([place])   . ESRD on hemodialysis ([place])    a. Tues/Sat; b. 2/2 small kidneys  . Essential hypertension   . Fistula    lower left arm  . GERD (gastroesophageal reflux disease)   . Gout   . History of echocardiogram    a. TTE 01/2014: nl LV sys fxn, no valvular abnormalities; b. TTE 11/16: nl EF, mild LVH  . History of exercise stress test    a. 01/2014: no evidence of ischemia; b. Lexiscan 08/2015: no sig ischemia, severe GI uptake artifact, low risk; c. CPET @ [facility] 09/2016: exercised 3 min 12 sec on bike without incline, 2.28 METs, VO2 of 8.1, 48% of predicted, indicating mod to sev functional impairment, evidence of blunted HR, stroke volume, and BP augmentation as well as ventilation-perfusion mismatch with exercise  .  HLD (hyperlipidemia)   . 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 Renal insufficiency 09/24/2017   Dialysis patient.  . Sleep apnea    Past Surgical History:  Procedure Laterality Date  . carpel tunnel    . GALLBLADDER SURGERY    . PERIPHERAL VASCULAR CATHETERIZATION N/A 04/12/2015   Procedure: A/V Shuntogram/Fistulagram;  Surgeon: Algernon Huxley, MD;  Location: [place] [facility];  Service: Cardiovascular;  Laterality:  N/A;  . PERIPHERAL VASCULAR CATHETERIZATION N/A 04/12/2015   Procedure: A/V Shunt Intervention;  Surgeon: Algernon Huxley, MD;  Location: [place] [facility];  Service: Cardiovascular;  Laterality: N/A;  . PERIPHERAL VASCULAR CATHETERIZATION N/A 06/09/2015   Procedure: Dialysis/Perma Catheter Removal;  Surgeon: Katha Cabal, MD;  Location: [place] [facility];  Service: Cardiovascular;  Laterality: N/A;     A IV Location/Drains/Wounds Patient Lines/Drains/Airways Status   Active Line/Drains/Airways    Name:   Placement date:   Placement time:   Site:   Days:   Peripheral IV 05/04/19 Right Antecubital   05/04/19    2200    Antecubital   1   Fistula / Graft Left Forearm Arteriovenous fistula   -    -    Forearm      Pressure Injury 03/16/18 Stage I -  Intact skin with non-blanchable redness of a localized area usually over a bony prominence. Red   03/16/18    1000     415          Intake/Output Last 24 hours No intake or output data in the 24 hours ending 05/05/19 0112  Labs/Imaging Results for orders placed or performed during the hospital encounter of 05/04/19 (from the past 48 hour(s))  Basic metabolic panel     Status: Abnormal   Collection Time: 05/04/19 10:01 PM  Result Value Ref Range   Sodium 142 135 - 145 mmol/L   Potassium 3.6 3.5 - 5.1 mmol/L   Chloride 99 98 - 111 mmol/L   CO2 28 22 - 32 mmol/L   Glucose, Bld 152 (H) 70 - 99 mg/dL   BUN 30 (H) 8 - 23 mg/dL   Creatinine, Ser 5.35 (H) 0.44 - 1.00 mg/dL   Calcium 9.1 8.9 - 10.3 mg/dL   GFR calc non Af Amer 7 (L) >60 mL/min   GFR calc Af Amer 9 (L) >60 mL/min   Anion gap 15 5 - 15    Comment: Performed at [facility], [address] [place], [place] [address]  CBC     Status: Abnormal   Collection Time: 05/04/19 10:01 PM  Result Value Ref Range   WBC 6.5 4.0 - 10.5 K/uL   RBC 3.21 (L) 3.87 - 5.11 MIL/uL   Hemoglobin 9.5 (L) 12.0 - 15.0 g/dL   HCT 29.3 (L) 36.0 - 46.0 %   MCV 91.3 80.0 - 100.0 fL    MCH 29.6 26.0 - 34.0 pg   MCHC 32.4 30.0 - 36.0 g/dL   RDW 13.5 11.5 - 15.5 %   Platelets 164 150 - 400 K/uL   nRBC 0.0 0.0 - 0.2 %    Comment: Performed at [facility], [address] [place],  [address]  Troponin I - ONCE - STAT     Status: Abnormal   Collection Time: 05/04/19 10:01 PM  Result Value Ref Range   Troponin I 0.03 (HH) <0.03 ng/mL    Comment: CRITICAL RESULT CALLED TO, READ BACK BY  AND VERIFIED WITH Laren Boom AT 2246 05/04/2019/TFK Performed at [facility], [address] [place], [place] [address]   SARS Coronavirus 2 (CEPHEID - Performed in [facility] hospital lab), Hosp Order     Status: None   Collection Time: 05/04/19 11:43 PM   Specimen: Nasopharyngeal Swab  Result Value Ref Range   SARS Coronavirus 2 NEGATIVE NEGATIVE    Comment: (NOTE) If result is NEGATIVE SARS-CoV-2 target nucleic acids are NOT DETECTED. The SARS-CoV-2 RNA is generally detectable in upper and lower  respiratory specimens during the acute phase of infection. The lowest  concentration of SARS-CoV-2 viral copies this assay can detect is 250  copies / mL. A negative result does not preclude SARS-CoV-2 infection  and should not be used as the sole basis for treatment or other  patient management decisions.  A negative result may occur with  improper specimen collection / handling, submission of specimen other  than nasopharyngeal swab, presence of viral mutation(s) within the  areas targeted by this assay, and inadequate number of viral copies  (<250 copies / mL). A negative result must be combined with clinical  observations, patient history, and epidemiological information. If result is POSITIVE SARS-CoV-2 target nucleic acids are DETECTED. The SARS-CoV-2 RNA is generally detectable in upper and lower  respiratory specimens dur ing the acute phase of infection.  Positive  results are indicative of active infection with SARS-CoV-2.  Clinical  correlation  with patient history and other diagnostic information is  necessary to determine patient infection status.  Positive results do  not rule out bacterial infection or co-infection with other viruses. If result is PRESUMPTIVE POSTIVE SARS-CoV-2 nucleic acids MAY BE PRESENT.   A presumptive positive result was obtained on the submitted specimen  and confirmed on repeat testing.  While 2019 novel coronavirus  (SARS-CoV-2) nucleic acids may be present in the submitted sample  additional confirmatory testing may be necessary for epidemiological  and / or clinical management purposes  to differentiate between  SARS-CoV-2 and other Sarbecovirus currently known to infect humans.  If clinically indicated additional testing with an alternate test  methodology [phone]) is advised. The SARS-CoV-2 RNA is generally  detectable in upper and lower respiratory sp ecimens during the acute  phase of infection. The expected result is Negative. Fact Sheet for Patients:  StrictlyIdeas.no Fact Sheet for Healthcare Providers: BankingDealers.co.za This test is not yet approved or cleared by the [place] FDA and has been authorized for detection and/or diagnosis of SARS-CoV-2 by FDA under an Emergency Use Authorization (EUA).  This EUA will remain in effect (meaning this test can be used) for the duration of the COVID-19 declaration under Section 564(b)(1) of the Act, 21 U.S.C. section 360bbb-3(b)(1), unless the authorization is terminated or revoked sooner. Performed at [facility], [address] [place], [place] [address]    Dg Chest 2 View  Result Date: 05/04/2019 CLINICAL DATA:  Patient reports two episodes of SOB. Reports first episode onset 5 days ago and second episode onset last night. Reports most recent episode has not resolved yet. Patient believes SOB is a result of costochondritis. Hx GERD, HTN, ESRD, DM, COPD, CHF, asthma.  Non-smoker. EXAM: CHEST - 2 VIEW COMPARISON:  12/21/2018 FINDINGS: Cardiac silhouette is top-normal in size. No mediastinal or hilar masses. No evidence of adenopathy. There are thickened bilateral bronchovascular markings and mild interstitial thickening, similar to the prior exam. No evidence of pneumonia or pulmonary edema. No pleural effusion or pneumothorax. Skeletal structures  are demineralized but intact. IMPRESSION: No acute cardiopulmonary disease. Electronically Signed   By: Lajean Manes M.D.   On: 05/04/2019 15:52    Pending Labs [employer] (From admission, onward)    Start     Ordered   Signed and Held  CBC  (heparin)  Once,   R    Comments: Baseline for heparin therapy IF NOT ALREADY DRAWN.  Notify MD if PLT < 100 K.    Signed and Held   Signed and Held  Creatinine, serum  (heparin)  Once,   R    Comments: Baseline for heparin therapy IF NOT ALREADY DRAWN.    Signed and Held   Signed and Held  Troponin I - Now Then Q6H  Now then every 6 hours,   R     Signed and Held          Vitals/Pain Today's Vitals   05/04/19 2230 05/04/19 2300 05/04/19 2330 05/05/19 0030  BP: (!) 177/52 (!) 170/54 (!) 188/63 (!) 185/57  Pulse: 80 79 81 80  Resp: 16 (!) 23 17 18   Temp:      TempSrc:      SpO2: 91% 92% 94% 99%  Weight:      Height:      PainSc:        Isolation Precautions No active isolations  Medications Medications  labetalol (NORMODYNE) injection 10 mg (10 mg Intravenous Given 05/05/19 0048)  HYDROcodone-acetaminophen (NORCO) 7.5-325 MG per tablet 1 tablet (has no administration in time range)  acetaminophen (TYLENOL) tablet 1,000 mg (1,000 mg Oral Given 05/05/19 0002)    Mobility walks with person assist Low fall risk   Focused Assessments Cardiac Assessment Handoff:    Lab Results  Component Value Date   CKTOTAL 38 11/28/2018   CKMB 1.8 12/04/2014   TROPONINI 0.03 ([place]) 05/04/2019   No results found for: DDIMER Does the Patient currently have chest  pain? Yes     R Recommendations: See Admitting Provider Note  Report given to:   Additional Notes: pt is also dialysis pt. Pt had last dialysis yesterday. Access in left forearm. Pt goes tues/thur/sat

## 2019-05-05 NOTE — Progress Notes (Signed)
[facility]  ROUNDING NOTE   Subjective:   [name] admitted to [facility] on 05/04/2019 for SOB (shortness of breath) [R06.02] Chest pain, unspecified type [R07.9]  Patient's last hemodialysis was Saturday.   Objective:  Vital signs in last 24 hours:  Temp:  [98.2 F (36.8 C)-99.1 F (37.3 C)] 98.4 F (36.9 C) (06/15 0752) Pulse Rate:  [66-92] 78 (06/15 0752) Resp:  [11-23] 18 (06/15 0752) BP: (148-188)/(48-64) 163/64 (06/15 0752) SpO2:  [91 %-100 %] 100 % (06/15 0752) Weight:  [58.5 kg-59 kg] 58.5 kg (06/15 0200)  Weight change:  Filed Weights   05/04/19 2154 05/05/19 0200  Weight: 59 kg 58.5 kg    Intake/Output: I/O last 3 completed shifts: In: 240 [P.O.:240] Out: -    Intake/Output this shift:  Total I/O In: 243 [P.O.:240; I.V.:3] Out: 325 [Urine:325]  Physical Exam: General: NAD,   Head: Normocephalic, atraumatic. Moist oral mucosal membranes  Eyes: Anicteric, PERRL  Neck: Supple, trachea midline  Chest Tender to palpation  Lungs:  Clear to auscultation  Heart: Regular rate and rhythm  Abdomen:  Soft, nontender,   Extremities: no peripheral edema.  Neurologic: Nonfocal, moving all four extremities  Skin: No lesions  Access: Left AVF    Basic Metabolic Panel: Recent Labs  Lab 05/04/19 1450 05/04/19 2201 05/05/19 0434  NA 141 142  --   K 3.7 3.6  --   CL 99 99  --   CO2 30 28  --   GLUCOSE 107* 152*  --   BUN 26* 30*  --   CREATININE 4.85* 5.35* 5.78*  CALCIUM 9.0 9.1  --     Liver Function Tests: No results for input(s): AST, ALT, ALKPHOS, BILITOT, PROT, ALBUMIN in the last 168 hours. No results for input(s): LIPASE, AMYLASE in the last 168 hours. No results for input(s): AMMONIA in the last 168 hours.  CBC: Recent Labs  Lab 05/04/19 1450 05/04/19 2201 05/05/19 0434  WBC 6.0 6.5 5.8  NEUTROABS 4.2  --   --   HGB 9.8* 9.5* 8.6*  HCT 29.9* 29.3* 26.5*  MCV 89.5 91.3 92.3  PLT 182 164 117*    Cardiac  Enzymes: Recent Labs  Lab 05/04/19 1450 05/04/19 2201 05/05/19 0434 05/05/19 1026  TROPONINI 0.03* 0.03* 0.05* 0.04*    BNP: Invalid input(s): POCBNP  CBG: Recent Labs  Lab 05/05/19 0754 05/05/19 1134  GLUCAP 71 115*    Microbiology: Results for orders placed or performed during the hospital encounter of 05/04/19  SARS Coronavirus 2 (CEPHEID - Performed in [place] hospital lab), Hosp Order     Status: None   Collection Time: 05/04/19 11:43 PM   Specimen: Nasopharyngeal Swab  Result Value Ref Range Status   SARS Coronavirus 2 NEGATIVE NEGATIVE Final    Comment: (NOTE) If result is NEGATIVE SARS-CoV-2 target nucleic acids are NOT DETECTED. The SARS-CoV-2 RNA is generally detectable in upper and lower  respiratory specimens during the acute phase of infection. The lowest  concentration of SARS-CoV-2 viral copies this assay can detect is 250  copies / mL. A negative result does not preclude SARS-CoV-2 infection  and should not be used as the sole basis for treatment or other  patient management decisions.  A negative result may occur with  improper specimen collection / handling, submission of specimen other  than nasopharyngeal swab, presence of viral mutation(s) within the  areas targeted by this assay, and inadequate number of viral copies  (<250 copies /  mL). A negative result must be combined with clinical  observations, patient history, and epidemiological information. If result is POSITIVE SARS-CoV-2 target nucleic acids are DETECTED. The SARS-CoV-2 RNA is generally detectable in upper and lower  respiratory specimens dur ing the acute phase of infection.  Positive  results are indicative of active infection with SARS-CoV-2.  Clinical  correlation with patient history and other diagnostic information is  necessary to determine patient infection status.  Positive results do  not rule out bacterial infection or co-infection with other viruses. If result is  PRESUMPTIVE POSTIVE SARS-CoV-2 nucleic acids MAY BE PRESENT.   A presumptive positive result was obtained on the submitted specimen  and confirmed on repeat testing.  While 2019 novel coronavirus  (SARS-CoV-2) nucleic acids may be present in the submitted sample  additional confirmatory testing may be necessary for epidemiological  and / or clinical management purposes  to differentiate between  SARS-CoV-2 and other Sarbecovirus currently known to infect humans.  If clinically indicated additional testing with an alternate test  methodology [phone]) is advised. The SARS-CoV-2 RNA is generally  detectable in upper and lower respiratory sp ecimens during the acute  phase of infection. The expected result is Negative. Fact Sheet for Patients:  StrictlyIdeas.no Fact Sheet for Healthcare Providers: BankingDealers.co.za This test is not yet approved or cleared by the [place] FDA and has been authorized for detection and/or diagnosis of SARS-CoV-2 by FDA under an Emergency Use Authorization (EUA).  This EUA will remain in effect (meaning this test can be used) for the duration of the COVID-19 declaration under Section 564(b)(1) of the Act, 21 U.S.C. section 360bbb-3(b)(1), unless the authorization is terminated or revoked sooner. Performed at [facility], [address] [place], [place] [address]     Coagulation Studies: No results for input(s): LABPROT, INR in the last 72 hours.  Urinalysis: No results for input(s): COLORURINE, LABSPEC, PHURINE, GLUCOSEU, HGBUR, BILIRUBINUR, KETONESUR, PROTEINUR, UROBILINOGEN, NITRITE, LEUKOCYTESUR in the last 72 hours.  Invalid input(s): APPERANCEUR    Imaging: Dg Chest 2 View  Result Date: 05/04/2019 CLINICAL DATA:  Patient reports two episodes of SOB. Reports first episode onset 5 days ago and second episode onset last night. Reports most recent episode has not resolved yet.  Patient believes SOB is a result of costochondritis. Hx GERD, HTN, ESRD, DM, COPD, CHF, asthma. Non-smoker. EXAM: CHEST - 2 VIEW COMPARISON:  12/21/2018 FINDINGS: Cardiac silhouette is top-normal in size. No mediastinal or hilar masses. No evidence of adenopathy. There are thickened bilateral bronchovascular markings and mild interstitial thickening, similar to the prior exam. No evidence of pneumonia or pulmonary edema. No pleural effusion or pneumothorax. Skeletal structures are demineralized but intact. IMPRESSION: No acute cardiopulmonary disease. Electronically Signed   By: Lajean Manes M.D.   On: 05/04/2019 15:52     Medications:    . amLODipine  10 mg Oral Daily  . carvedilol  12.5 mg Oral BID WC  . clopidogrel  75 mg Oral Daily  . furosemide  80 mg Oral Daily  . heparin  5,000 Units Subcutaneous Q8H  . hydrALAZINE  25 mg Oral TID  . insulin aspart  0-5 Units Subcutaneous QHS  . insulin aspart  0-9 Units Subcutaneous TID WC  . losartan  50 mg Oral Daily  . mometasone-formoterol  2 puff Inhalation BID  . oxybutynin  15 mg Oral Daily  . pantoprazole  20 mg Oral BID AC  . pravastatin  40 mg Oral QHS  . sodium chloride  flush  3 mL Intravenous Q12H   acetaminophen **OR** acetaminophen, HYDROcodone-acetaminophen, labetalol, ondansetron **OR** ondansetron (ZOFRAN) IV  Assessment/ Plan:  [name] is a 73 y.o. white female with end stage renal disease on hemodialysis, hypertension, diabetes mellitus type II, overactive bladder, gout, COPD, congestive heart failure, coronary artery disease  [facility] T[facility] [address] 58.5kg Left AVF  1. End Stage Renal Disease: on hemodialysis.  Dialysis for tomorrow  2. Hypertension: 163/64 - elevated. Home regimen of amlodipine, carvedilol, furosemide, hydralazine, losartan.   3. Anemia with chronic kidney disease: hemoglobin 8.6 - EPO with HD treatment  4. Secondary Hyperparathyroidism: labs from 04/29/19 PTH 391, phosphorus 4.9,  calcium 9.5 - Calcium acetate with meals.    LOS: 0 Jabez Molner 6/15/20203:57 PM

## 2019-05-05 NOTE — Plan of Care (Signed)
  Problem: Education: Goal: Knowledge of General Education information will improve Description: Including pain rating scale, medication(s)/side effects and non-pharmacologic comfort measures Outcome: Progressing   Problem: Health Behavior/Discharge Planning: Goal: Ability to manage health-related needs will improve Outcome: Progressing   Problem: Clinical Measurements: Goal: Ability to maintain clinical measurements within normal limits will improve Outcome: Progressing Goal: Will remain free from infection Outcome: Progressing Note: Remains afebrile   Problem: Activity: Goal: Ability to tolerate increased activity will improve Outcome: Progressing   Problem: Education: Goal: Ability to verbalize understanding of medication therapies will improve Outcome: Progressing   Problem: Clinical Measurements: Goal: Respiratory complications will improve Outcome: Not Progressing Note: 100% on 2LO2, pt still feels like she can not breath with the oxygen on    Problem: Clinical Measurements: Goal: Diagnostic test results will improve Note: BUN 30/5.78, HBG 8.6/26.5

## 2019-05-05 NOTE — H&P (Signed)
[place] at [place] NAME: Phyllis [name]    MR#:  [id number]  DATE OF BIRTH:  [birth year]  DATE OF ADMISSION:  05/04/2019  PRIMARY CARE PHYSICIAN: [name], MD   REQUESTING/REFERRING PHYSICIAN: Kerman Passey, MD  CHIEF COMPLAINT:   Chief Complaint  Patient presents with  . Shortness of Breath    HISTORY OF PRESENT ILLNESS:  Phyllis [name]  is a 73 y.o. female who presents with chief complaint as above.  Patient presents to the ED with a complaint of shortness of breath.  She was in the ED initially early in the afternoon with this complaint.  Work-up was largely unrevealing and she was discharged home.  She states that when she got home she became much more short of breath again and called EMS.  EMS said her oxygen saturations were in the 80s when they arrived at her home.  Here in the ED her evaluation is much improved, but she does complain of chest pain.  She states that she has chronic costochondritis, and that this pain feels the same as her chronic pain.  However, she states she is more short of breath.  Work-up is again largely unrevealing.  However, she is significantly hypertensive.  The chest x-ray is clear, troponin is barely elevated which seems to be her baseline based on chart review.  Hospitalist called for admission and further evaluation  PAST MEDICAL HISTORY:   Past Medical History:  Diagnosis Date  . Allergy   . Anemia of chronic disease   . Anxiety   . Aortic atherosclerosis ([place])   . Asthma   . CHF (congestive heart failure) ([place])   . Chronic back pain   . COPD (chronic obstructive pulmonary disease) ([place])   . Diabetes mellitus with complication ([place])   . ESRD on hemodialysis ([place])    a. Tues/Sat; b. 2/2 small kidneys  . Essential hypertension   . Fistula    lower left arm  . GERD (gastroesophageal reflux disease)   . Gout   . History of echocardiogram    a. TTE 01/2014: nl LV sys fxn, no  valvular abnormalities; b. TTE 11/16: nl EF, mild LVH  . History of exercise stress test    a. 01/2014: no evidence of ischemia; b. Lexiscan 08/2015: no sig ischemia, severe GI uptake artifact, low risk; c. CPET @ [facility] 09/2016: exercised 3 min 12 sec on bike without incline, 2.28 METs, VO2 of 8.1, 48% of predicted, indicating mod to sev functional impairment, evidence of blunted HR, stroke volume, and BP augmentation as well as ventilation-perfusion mismatch with exercise  . HLD (hyperlipidemia)   . 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 Renal insufficiency 09/24/2017   Dialysis patient.  . Sleep apnea      PAST SURGICAL HISTORY:   Past Surgical History:  Procedure Laterality Date  . carpel tunnel    . GALLBLADDER SURGERY    . PERIPHERAL VASCULAR CATHETERIZATION N/A 04/12/2015   Procedure: A/V Shuntogram/Fistulagram;  Surgeon: Algernon Huxley, MD;  Location: [place] [facility];  Service: Cardiovascular;  Laterality: N/A;  . PERIPHERAL VASCULAR CATHETERIZATION N/A 04/12/2015   Procedure: A/V Shunt Intervention;  Surgeon: Algernon Huxley, MD;  Location: [facility];  Service: Cardiovascular;  Laterality: N/A;  . PERIPHERAL VASCULAR CATHETERIZATION N/A 06/09/2015   Procedure: Dialysis/Perma Catheter Removal;  Surgeon: Katha Cabal, MD;  Location: [place] [facility];  Service: Cardiovascular;  Laterality:  N/A;     SOCIAL HISTORY:   Social History   Tobacco Use  . Smoking status: Never Smoker  . Smokeless tobacco: Never Used  Substance Use Topics  . Alcohol use: No     FAMILY HISTORY:   Family History  Problem Relation Age of Onset  . Hypertension Mother   . Hyperlipidemia Mother   . Heart disease Father   . Heart attack Father 4  . Hypertension Father   . Hyperlipidemia Father   . Heart disease Brother        CABG   . Heart attack Brother   . Breast cancer Neg Hx      DRUG ALLERGIES:   Allergies  Allergen Reactions  . Codeine Nausea  And Vomiting and Nausea Only  . Enalapril Maleate Other (See Comments)    Other reaction(s): Headache  . Nitrofurantoin Swelling and Rash    Other Reaction: swelling of body  . Sulfamethoxazole-Trimethoprim Swelling  . Vicodin [Hydrocodone-Acetaminophen] Nausea And Vomiting and Nausea Only  . 2,4-D Dimethylamine (Amisol) Rash and Other (See Comments)    Other Reaction: h/a  . Baclofen Other (See Comments) and Nausea Only    lightheadness ,drowsiness , muscle weakness , twitching in hands   . Neosporin [Neomycin-Bacitracin Zn-Polymyx] Other (See Comments) and Rash    Other Reaction: irritation Skin irritation   . Quinine Nausea And Vomiting, Rash and Other (See Comments)    Other Reaction: Vomiting, rash, h/a, vision  . Ultram [Tramadol] Palpitations  . Zocor [Simvastatin] Other (See Comments) and Rash    Other Reaction: muscle spasms Muscle pain and spasms  . Bactrim [Sulfamethoxazole-Trimethoprim] Swelling  . Levodopa Other (See Comments)    Reaction: unknown  . Macrodantin [Nitrofurantoin Macrocrystal] Swelling  . Quinine Derivatives Other (See Comments)    Vertigo,nausea vomiting blurred vision headache ears sensitivity     MEDICATIONS AT HOME:   Prior to Admission medications   Medication Sig Start Date End Date Taking? Authorizing Provider  albuterol (PROVENTIL HFA;VENTOLIN HFA) 108 (90 Base) MCG/ACT inhaler Inhale 2 puffs into the lungs every 6 (six) hours as needed for wheezing or shortness of breath.   Yes [provider]  albuterol (PROVENTIL) (2.5 MG/3ML) 0.083% nebulizer solution Take 3 mLs (2.5 mg total) by nebulization every 6 (six) hours as needed for wheezing or shortness of breath. 05/04/19  Yes Nance Pear, MD  amLODipine (NORVASC) 10 MG tablet Take 10 mg by mouth.  12/29/13  Yes [provider]  aspirin EC 81 MG EC tablet Take 1 tablet (81 mg total) by mouth daily. 09/20/17  Yes Mody, Ulice Bold, MD  calcium acetate (PHOSLO) 667 MG capsule Take  667 mg by mouth 3 (three) times daily with meals.    Yes [provider]  carvedilol (COREG) 12.5 MG tablet Take 12.5 mg by mouth 2 (two) times daily with a meal.  01/09/14  Yes [provider]  cetirizine (ZYRTEC) 10 MG tablet Take 10 mg by mouth at bedtime.    Yes [provider]  clopidogrel (PLAVIX) 75 MG tablet Take 1 tablet (75 mg total) by mouth daily. 01/18/18  Yes Epifanio Lesches, MD  cyclobenzaprine (FLEXERIL) 10 MG tablet Take 10 mg by mouth daily as needed for muscle spasms.    Yes [provider]  Fluticasone-Salmeterol (ADVAIR) 250-50 MCG/DOSE AEPB Inhale 1 puff into the lungs 2 (two) times daily as needed (for shortness of breath).    Yes [provider]  furosemide (LASIX) 80 MG tablet Take 1  tablet (80 mg total) by mouth daily. 03/17/18  Yes Wieting, Richard, MD  hydrALAZINE (APRESOLINE) 25 MG tablet Take 25 mg by mouth 3 (three) times daily. 03/25/19  Yes [provider]  HYDROcodone-acetaminophen (NORCO) 7.5-325 MG tablet Take 1-2 tablets by mouth daily as needed (pain).  04/09/19  Yes [provider]  lidocaine-prilocaine (EMLA) cream Apply 1 application topically as needed (prior to treatment).    Yes [provider]  losartan (COZAAR) 50 MG tablet Take 50 mg by mouth daily. 03/18/19  Yes [provider]  omeprazole (PRILOSEC) 20 MG capsule Take 20 mg by mouth 2 (two) times daily before a meal.   Yes [provider]  oxybutynin (DITROPAN XL) 15 MG 24 hr tablet Take 15 mg by mouth daily. 12/24/17  Yes [provider]  pravastatin (PRAVACHOL) 40 MG tablet Take 40 mg by mouth at bedtime.    Yes [provider]  topiramate (TOPAMAX) 25 MG tablet Take 25 mg by mouth as needed (for headaches).    Yes [provider]    REVIEW OF SYSTEMS:  Review of Systems  Constitutional: Negative for chills, fever, malaise/fatigue and weight loss.  HENT: Negative for ear pain, hearing  loss and tinnitus.   Eyes: Negative for blurred vision, double vision, pain and redness.  Respiratory: Positive for shortness of breath. Negative for cough and hemoptysis.   Cardiovascular: Positive for chest pain. Negative for palpitations, orthopnea and leg swelling.  Gastrointestinal: Negative for abdominal pain, constipation, diarrhea, nausea and vomiting.  Genitourinary: Negative for dysuria, frequency and hematuria.  Musculoskeletal: Negative for back pain, joint pain and neck pain.  Skin:       No acne, rash, or lesions  Neurological: Negative for dizziness, tremors, focal weakness and weakness.  Endo/Heme/Allergies: Negative for polydipsia. Does not bruise/bleed easily.  Psychiatric/Behavioral: Negative for depression. The patient is not nervous/anxious and does not have insomnia.      VITAL SIGNS:   Vitals:   05/04/19 2230 05/04/19 2300 05/04/19 2330 05/05/19 0030  BP: (!) 177/52 (!) 170/54 (!) 188/63 (!) 185/57  Pulse: 80 79 81 80  Resp: 16 (!) 23 17 18   Temp:      TempSrc:      SpO2: 91% 92% 94% 99%  Weight:      Height:       Wt Readings from Last 3 Encounters:  05/04/19 59 kg  05/04/19 59 kg  01/13/19 60.4 kg    PHYSICAL EXAMINATION:  Physical Exam  Vitals reviewed. Constitutional: She is oriented to person, place, and time. She appears well-developed and well-nourished. No distress.  HENT:  Head: Normocephalic and atraumatic.  Mouth/Throat: Oropharynx is clear and moist.  Eyes: Pupils are equal, round, and reactive to light. Conjunctivae and EOM are normal. No scleral icterus.  Neck: Normal range of motion. Neck supple. No JVD present. No thyromegaly present.  Cardiovascular: Normal rate, regular rhythm and intact distal pulses. Exam reveals no gallop and no friction rub.  No murmur heard. Respiratory: Effort normal and breath sounds normal. No respiratory distress. She has no wheezes. She has no rales.  GI: Soft. Bowel sounds are normal. She exhibits no  distension. There is no abdominal tenderness.  Musculoskeletal: Normal range of motion.        General: No edema.     Comments: No arthritis, no gout  Lymphadenopathy:    She has no cervical adenopathy.  Neurological: She is alert and oriented to person, place, and time. No cranial nerve  deficit.  No dysarthria, no aphasia  Skin: Skin is warm and dry. No rash noted. No erythema.  Psychiatric: She has a normal mood and affect. Her behavior is normal. Judgment and thought content normal.    LABORATORY PANEL:   CBC Recent Labs  Lab 05/04/19 2201  WBC 6.5  HGB 9.5*  HCT 29.3*  PLT 164   ------------------------------------------------------------------------------------------------------------------  Chemistries  Recent Labs  Lab 05/04/19 2201  NA 142  K 3.6  CL 99  CO2 28  GLUCOSE 152*  BUN 30*  CREATININE 5.35*  CALCIUM 9.1   ------------------------------------------------------------------------------------------------------------------  Cardiac Enzymes Recent Labs  Lab 05/04/19 2201  TROPONINI 0.03*   ------------------------------------------------------------------------------------------------------------------  RADIOLOGY:  Dg Chest 2 View  Result Date: 05/04/2019 CLINICAL DATA:  Patient reports two episodes of SOB. Reports first episode onset 5 days ago and second episode onset last night. Reports most recent episode has not resolved yet. Patient believes SOB is a result of costochondritis. Hx GERD, HTN, ESRD, DM, COPD, CHF, asthma. Non-smoker. EXAM: CHEST - 2 VIEW COMPARISON:  12/21/2018 FINDINGS: Cardiac silhouette is top-normal in size. No mediastinal or hilar masses. No evidence of adenopathy. There are thickened bilateral bronchovascular markings and mild interstitial thickening, similar to the prior exam. No evidence of pneumonia or pulmonary edema. No pleural effusion or pneumothorax. Skeletal structures are demineralized but intact. IMPRESSION: No acute  cardiopulmonary disease. Electronically Signed   By: Lajean Manes M.D.   On: 05/04/2019 15:52    EKG:   Orders placed or performed during the hospital encounter of 05/04/19  . ED EKG  . ED EKG    IMPRESSION AND PLAN:  Principal Problem:   Chest pain -unclear etiology.  Does not seem to be ACS based on her EKG without ischemic findings, troponin barely elevated though seems to be her baseline.  However, we will trend her cardiac enzymes, treat her blood pressure, and get a cardiology consult given that she is also complaining of increased shortness of breath. Active Problems:   End stage renal disease on dialysis [facility]) -nephrology consult for dialysis support   Type 2 diabetes mellitus with other specified complication (HCC) -sliding scale insulin coverage   Uncontrolled hypertension -continue home dose antihypertensives with additional PRN antihypertensives to keep blood pressure less than 160/100   Chronic diastolic heart failure ([place]) -does not seem to be in exacerbation.  Chest x-ray was clear.  Continue home meds, other work-up as above   Hyperlipidemia -home dose antilipid  Chart review performed and case discussed with ED provider. Labs, imaging and/or ECG reviewed by provider and discussed with patient/family. Management plans discussed with the patient and/or family.  COVID-19 status: Tested negative     DVT PROPHYLAXIS: SubQ heparin  GI PROPHYLAXIS:  None  ADMISSION STATUS: Observation  CODE STATUS: Full Code Status History    Date Active Date Inactive Code Status Order ID Comments User Context   11/29/2018 0554 11/29/2018 2201 Full Code [id number]  Lance Coon, MD ED   10/21/2018 2235 10/23/2018 1643 Full Code [id number]  Lance Coon, MD Inpatient   03/16/2018 0806 03/17/2018 1637 Full Code [id number]  Loletha Grayer, MD ED   01/16/2018 1630 01/18/2018 2158 Full Code [id number]  Dustin Flock, MD Inpatient   01/16/2018 1340 01/16/2018 1630 DNR [id number]  Dustin Flock, MD  ED   09/18/2017 1348 09/19/2017 1513 DNR [id number]  Loletha Grayer, MD ED   04/12/2015 1056 04/12/2015 1631 Full Code [id number]  Dew, Erskine Squibb, MD Inpatient   Advance Care Planning Activity  TOTAL TIME TAKING CARE OF THIS PATIENT: 40 minutes.   This patient was evaluated in the context of the global COVID-19 pandemic, which necessitated consideration that the patient might be at risk for infection with the SARS-CoV-2 virus that causes COVID-19. Institutional protocols and algorithms that pertain to the evaluation of patients at risk for COVID-19 are in a state of rapid change based on information released by regulatory bodies including the CDC and federal and state organizations. These policies and algorithms were followed to the best of this provider's knowledge to date during the patient's care at this facility.  Ethlyn Daniels 05/05/2019, 1:07 AM  [facility]  Office  [phone]  CC: Primary care physician; [name], MD  Note:  This document was prepared using Dragon voice recognition software and may include unintentional dictation errors.

## 2019-05-05 NOTE — Progress Notes (Signed)
[place] at [place] NAME: Phyllis [name]    MR#:  [id number]  DATE OF BIRTH:  1945/12/23  SUBJECTIVE:  CHIEF COMPLAINT:   Chief Complaint  Patient presents with   Shortness of Breath    No new complaints.  Shortness of breath improving.  Chest pain improved.  REVIEW OF SYSTEMS:  Review of Systems  Constitutional: Negative for chills and fever.  HENT: Negative for hearing loss and tinnitus.   Eyes: Negative for blurred vision and double vision.  Respiratory: Positive for shortness of breath. Negative for cough and wheezing.   Cardiovascular: Positive for chest pain. Negative for orthopnea.  Gastrointestinal: Negative for heartburn and nausea.  Genitourinary: Negative for dysuria and urgency.  Musculoskeletal: Negative for myalgias and neck pain.  Skin: Negative for itching and rash.  Neurological: Negative for dizziness and headaches.  Psychiatric/Behavioral: Negative for depression and hallucinations.    DRUG ALLERGIES:   Allergies  Allergen Reactions   Codeine Nausea And Vomiting and Nausea Only   Enalapril Maleate Other (See Comments)    Other reaction(s): Headache   Nitrofurantoin Swelling and Rash    Other Reaction: swelling of body   Sulfamethoxazole-Trimethoprim Swelling   Vicodin [Hydrocodone-Acetaminophen] Nausea And Vomiting and Nausea Only   2,4-D Dimethylamine (Amisol) Rash and Other (See Comments)    Other Reaction: h/a   Baclofen Other (See Comments) and Nausea Only    lightheadness ,drowsiness , muscle weakness , twitching in hands    Neosporin [Neomycin-Bacitracin Zn-Polymyx] Other (See Comments) and Rash    Other Reaction: irritation Skin irritation    Quinine Nausea And Vomiting, Rash and Other (See Comments)    Other Reaction: Vomiting, rash, h/a, vision   Ultram [Tramadol] Palpitations   Zocor [Simvastatin] Other (See Comments) and Rash    Other Reaction: muscle spasms Muscle pain and  spasms   Bactrim [Sulfamethoxazole-Trimethoprim] Swelling   Levodopa Other (See Comments)    Reaction: unknown   Macrodantin [Nitrofurantoin Macrocrystal] Swelling   Quinine Derivatives Other (See Comments)    Vertigo,nausea vomiting blurred vision headache ears sensitivity    VITALS:  Blood pressure (!) 163/64, pulse 78, temperature 98.4 F (36.9 C), temperature source Oral, resp. rate 18, height 4\' 10"  (1.473 m), weight 58.5 kg, SpO2 100 %. PHYSICAL EXAMINATION:  Physical Exam  Constitutional: She is oriented to person, place, and time. She appears well-developed.  HENT:  Head: Normocephalic and atraumatic.  Right Ear: External ear normal.  Eyes: Pupils are equal, round, and reactive to light. Conjunctivae are normal. Right eye exhibits no discharge.  Neck: Normal range of motion. Neck supple. No thyromegaly present.  Cardiovascular: Normal rate and regular rhythm.  No murmur heard. Respiratory: Effort normal and breath sounds normal. No respiratory distress.  GI: Soft. Bowel sounds are normal. She exhibits no distension.  Musculoskeletal: Normal range of motion.        General: No edema.  Neurological: She is alert and oriented to person, place, and time. No cranial nerve deficit.  Skin: Skin is warm. She is not diaphoretic. No erythema.  Psychiatric: She has a normal mood and affect. Her behavior is normal.    LABORATORY PANEL:  Female CBC Recent Labs  Lab 05/05/19 0434  WBC 5.8  HGB 8.6*  HCT 26.5*  PLT 117*   ------------------------------------------------------------------------------------------------------------------ Chemistries  Recent Labs  Lab 05/04/19 2201 05/05/19 0434  NA 142  --   K 3.6  --   CL 99  --  CO2 28  --   GLUCOSE 152*  --   BUN 30*  --   CREATININE 5.35* 5.78*  CALCIUM 9.1  --    RADIOLOGY:  No results found. ASSESSMENT AND PLAN:   1.  Atypical chest pain and shortness of breath Patient with chronically mildly elevated  troponin which is stable with recent troponin of 0.04.  No EKG changes. Patient seen by cardiologist.  Chest pain said to be secondary to costochondritis.  2D echocardiogram requested to evaluate cardiac function. Patient said to be slightly anemic with hemoglobin of 8.6.  Cardiologist noted patient has been on dual antiplatelet therapy with aspirin and Plavix since March 2019 due to concern for possible TIA/CVA in the past.  Cardiologist recommended discontinuing 1 of the antiplatelet therapy since no indication for dual therapy at this time.  Low-dose aspirin discontinued to decrease risk of bleeding and worsening of anemia  2.  End-stage renal disease on hemodialysis Nephrologist already consulted for inpatient hemodialysis  3.  Diabetes mellitus type 2 Sliding scale insulin coverage.  Glycosylated hemoglobin level in a.m.  4.  Hypertension Continue current blood pressure medications.  PRN IV hydralazine for systolic blood pressure greater than 160.  Monitor and adjust regimen as needed  5.  Chronic diastolic CHF Stable.  6.  Hyperlipidemia Continue statins  DVT prophylaxis; heparin    All the records are reviewed and case discussed with Care Management/Social Worker. Management plans discussed with the patient, family and they are in agreement.  CODE STATUS: Full Code  TOTAL TIME TAKING CARE OF THIS PATIENT: 36 minutes.   More than 50% of the time was spent in counseling/coordination of care: YES  POSSIBLE D/C IN 2 DAYS, DEPENDING ON CLINICAL CONDITION.   [name] M.D on 05/05/2019 at 3:31 PM  Between 7am to 6pm - Pager - [phone]  After 6pm go to [email or web address] - Proofreader  [facility]  Office  [phone]  CC: Primary care physician; [name], MD  Note: This dictation was prepared with Dragon dictation along with smaller phrase technology. Any transcriptional errors that result from this process are unintentional.

## 2019-05-06 ENCOUNTER — Observation Stay: Payer: Medicare Other

## 2019-05-06 ENCOUNTER — Observation Stay (HOSPITAL_BASED_OUTPATIENT_CLINIC_OR_DEPARTMENT_OTHER)
Admit: 2019-05-06 | Discharge: 2019-05-06 | Disposition: A | Discharge status: Home / Self Care | Payer: Medicare Other | Attending: Physician Assistant | Admitting: Physician Assistant

## 2019-05-06 DIAGNOSIS — R7989 Other specified abnormal findings of blood chemistry: Secondary | ICD-10-CM

## 2019-05-06 DIAGNOSIS — I132 Hypertensive heart and chronic kidney disease with heart failure and with stage 5 chronic kidney disease, or end stage renal disease: Secondary | ICD-10-CM | POA: Diagnosis present

## 2019-05-06 DIAGNOSIS — Z7951 Long term (current) use of inhaled steroids: Secondary | ICD-10-CM | POA: Diagnosis not present

## 2019-05-06 DIAGNOSIS — Z79899 Other long term (current) drug therapy: Secondary | ICD-10-CM | POA: Diagnosis not present

## 2019-05-06 DIAGNOSIS — M109 Gout, unspecified: Secondary | ICD-10-CM | POA: Diagnosis present

## 2019-05-06 DIAGNOSIS — E1122 Type 2 diabetes mellitus with diabetic chronic kidney disease: Secondary | ICD-10-CM | POA: Diagnosis present

## 2019-05-06 DIAGNOSIS — N2581 Secondary hyperparathyroidism of renal origin: Secondary | ICD-10-CM | POA: Diagnosis present

## 2019-05-06 DIAGNOSIS — I350 Nonrheumatic aortic (valve) stenosis: Secondary | ICD-10-CM

## 2019-05-06 DIAGNOSIS — M940 Chondrocostal junction syndrome [Tietze]: Secondary | ICD-10-CM | POA: Diagnosis present

## 2019-05-06 DIAGNOSIS — J449 Chronic obstructive pulmonary disease, unspecified: Secondary | ICD-10-CM | POA: Diagnosis present

## 2019-05-06 DIAGNOSIS — E785 Hyperlipidemia, unspecified: Secondary | ICD-10-CM | POA: Diagnosis present

## 2019-05-06 DIAGNOSIS — R0789 Other chest pain: Secondary | ICD-10-CM

## 2019-05-06 DIAGNOSIS — I5033 Acute on chronic diastolic (congestive) heart failure: Secondary | ICD-10-CM | POA: Diagnosis not present

## 2019-05-06 DIAGNOSIS — Z1159 Encounter for screening for other viral diseases: Secondary | ICD-10-CM | POA: Diagnosis not present

## 2019-05-06 DIAGNOSIS — D631 Anemia in chronic kidney disease: Secondary | ICD-10-CM | POA: Diagnosis present

## 2019-05-06 DIAGNOSIS — N186 End stage renal disease: Secondary | ICD-10-CM | POA: Diagnosis present

## 2019-05-06 DIAGNOSIS — N3281 Overactive bladder: Secondary | ICD-10-CM | POA: Diagnosis present

## 2019-05-06 DIAGNOSIS — I10 Essential (primary) hypertension: Secondary | ICD-10-CM | POA: Diagnosis not present

## 2019-05-06 DIAGNOSIS — I2510 Atherosclerotic heart disease of native coronary artery without angina pectoris: Secondary | ICD-10-CM | POA: Diagnosis present

## 2019-05-06 DIAGNOSIS — R0602 Shortness of breath: Secondary | ICD-10-CM | POA: Diagnosis present

## 2019-05-06 DIAGNOSIS — Z7982 Long term (current) use of aspirin: Secondary | ICD-10-CM | POA: Diagnosis not present

## 2019-05-06 DIAGNOSIS — Z7902 Long term (current) use of antithrombotics/antiplatelets: Secondary | ICD-10-CM | POA: Diagnosis not present

## 2019-05-06 DIAGNOSIS — Z992 Dependence on renal dialysis: Secondary | ICD-10-CM | POA: Diagnosis not present

## 2019-05-06 LAB — BASIC METABOLIC PANEL
Anion gap: 13 (ref 5–15)
BUN: 43 mg/dL — ABNORMAL HIGH (ref 8–23)
CO2: 27 mmol/L (ref 22–32)
Calcium: 8.7 mg/dL — ABNORMAL LOW (ref 8.9–10.3)
Chloride: 100 mmol/L (ref 98–111)
Creatinine, Ser: 7.19 mg/dL — ABNORMAL HIGH (ref 0.44–1.00)
GFR calc Af Amer: 6 mL/min — ABNORMAL LOW (ref >60)
GFR calc non Af Amer: 5 mL/min — ABNORMAL LOW (ref >60)
Glucose, Bld: 105 mg/dL — ABNORMAL HIGH (ref 70–99)
Potassium: 3.9 mmol/L (ref 3.5–5.1)
Sodium: 140 mmol/L (ref 135–145)

## 2019-05-06 LAB — GLUCOSE, CAPILLARY
Glucose-Capillary: 100 mg/dL — ABNORMAL HIGH (ref 70–99)
Glucose-Capillary: 99 mg/dL (ref 70–99)
Glucose-Capillary: 83 mg/dL (ref 70–99)
Glucose-Capillary: 207 mg/dL — ABNORMAL HIGH (ref 70–99)

## 2019-05-06 LAB — ECHOCARDIOGRAM COMPLETE
Height: 58 in
Weight: 2092.8 oz

## 2019-05-06 LAB — CBC
HCT: 27.3 % — ABNORMAL LOW (ref 36.0–46.0)
Hemoglobin: 8.6 g/dL — ABNORMAL LOW (ref 12.0–15.0)
MCH: 29.1 pg (ref 26.0–34.0)
MCHC: 31.5 g/dL (ref 30.0–36.0)
MCV: 92.2 fL (ref 80.0–100.0)
Platelets: 139 10*3/uL — ABNORMAL LOW (ref 150–400)
RBC: 2.96 MIL/uL — ABNORMAL LOW (ref 3.87–5.11)
RDW: 13.6 % (ref 11.5–15.5)
WBC: 8.6 10*3/uL (ref 4.0–10.5)
nRBC: 0 % (ref 0.0–0.2)

## 2019-05-06 LAB — HEMOGLOBIN A1C
Hgb A1c MFr Bld: 4.5 % — ABNORMAL LOW (ref 4.8–5.6)
Mean Plasma Glucose: 82.45 mg/dL

## 2019-05-06 LAB — MRSA PCR SCREENING: MRSA by PCR: NEGATIVE

## 2019-05-06 LAB — MAGNESIUM: Magnesium: 2.3 mg/dL (ref 1.7–2.4)

## 2019-05-06 MED ORDER — MORPHINE SULFATE (PF) 2 MG/ML IV SOLN: 2.0000 mg | Freq: Once | INTRAVENOUS | Status: DC

## 2019-05-06 MED ORDER — FUROSEMIDE 10 MG/ML IJ SOLN
40.0000 mg | Freq: Once | INTRAMUSCULAR | Status: AC
  Administered 2019-05-06: 40 mg via INTRAVENOUS
  Filled 2019-05-06: qty 4

## 2019-05-06 MED ORDER — PREDNISONE 20 MG PO TABS
40.0000 mg | ORAL_TABLET | Freq: Every day | ORAL | Status: DC
  Administered 2019-05-06 – 2019-05-08 (×3): 40 mg via ORAL
  Filled 2019-05-06 (×2): qty 2
  Filled 2019-05-06: qty 4
  Filled 2019-05-06: qty 2

## 2019-05-06 MED ORDER — EPOETIN ALFA 10000 UNIT/ML IJ SOLN
10000.0000 [IU] | INTRAMUSCULAR | Status: DC
  Administered 2019-05-06: 10000 [IU] via INTRAVENOUS

## 2019-05-06 NOTE — Progress Notes (Signed)
Pre HD Assessment    05/06/19 1340  Neurological  Level of Consciousness Alert  Orientation Level Oriented X4  Respiratory  Respiratory Pattern Regular  Chest Assessment Chest expansion symmetrical  Bilateral Breath Sounds Diminished;Expiratory wheezes  Cardiac  Heart Sounds Murmur  Cardiac Rhythm NSR  Vascular  R Radial Pulse +2  L Radial Pulse +2  Integumentary  Integumentary (WDL) X  Skin Color Appropriate for ethnicity  Skin Condition Dry  Skin Integrity Ecchymosis  Ecchymosis Location Arm  Ecchymosis Location Orientation Bilateral  Musculoskeletal  Musculoskeletal (WDL) X  Generalized Weakness Yes  GU Assessment  Genitourinary (WDL) X  Genitourinary Symptoms Oliguria (HD pt)  Urine Characteristics  Urine Color Yellow/straw  Urine Appearance Clear  Psychosocial  Psychosocial (WDL) WDL

## 2019-05-06 NOTE — Progress Notes (Signed)
[facility] [place] NAME: Phyllis [name]    MR#:  [id number]  DATE OF BIRTH:  02-Feb-1946  SUBJECTIVE:  CHIEF COMPLAINT:   Chief Complaint  Patient presents with  . Shortness of Breath   Last night patient appears to have gone into pulmonary edema as evidenced by chest x-ray done last night.  Was given a dose of Lasix.  Had hemodialysis done today.  Patient complaining of chronic chest pain from costochondritis.  Nephrologist okay with initiation of prednisone for same   REVIEW OF SYSTEMS:  Review of Systems  Constitutional: Negative for chills and fever.  HENT: Negative for hearing loss and tinnitus.   Eyes: Negative for blurred vision and double vision.  Respiratory: Positive for shortness of breath. Negative for cough and wheezing.   Cardiovascular: Positive for chest pain. Negative for orthopnea.  Gastrointestinal: Negative for heartburn and nausea.  Genitourinary: Negative for dysuria and urgency.  Musculoskeletal: Negative for myalgias and neck pain.  Skin: Negative for itching and rash.  Neurological: Negative for dizziness and headaches.  Psychiatric/Behavioral: Negative for depression and hallucinations.    DRUG ALLERGIES:   Allergies  Allergen Reactions  . Codeine Nausea And Vomiting and Nausea Only  . Enalapril Maleate Other (See Comments)    Other reaction(s): Headache  . Nitrofurantoin Swelling and Rash    Other Reaction: swelling of body  . Sulfamethoxazole-Trimethoprim Swelling  . Vicodin [Hydrocodone-Acetaminophen] Nausea And Vomiting and Nausea Only  . 2,4-D Dimethylamine (Amisol) Rash and Other (See Comments)    Other Reaction: h/a  . Baclofen Other (See Comments) and Nausea Only    lightheadness ,drowsiness , muscle weakness , twitching in hands   . Neosporin [Neomycin-Bacitracin Zn-Polymyx] Other (See Comments) and Rash    Other Reaction: irritation Skin irritation   . Quinine Nausea And Vomiting,  Rash and Other (See Comments)    Other Reaction: Vomiting, rash, h/a, vision  . Ultram [Tramadol] Palpitations  . Zocor [Simvastatin] Other (See Comments) and Rash    Other Reaction: muscle spasms Muscle pain and spasms  . Bactrim [Sulfamethoxazole-Trimethoprim] Swelling  . Levodopa Other (See Comments)    Reaction: unknown  . Macrodantin [Nitrofurantoin Macrocrystal] Swelling  . Quinine Derivatives Other (See Comments)    Vertigo,nausea vomiting blurred vision headache ears sensitivity    VITALS:  Blood pressure (!) 170/51, pulse 69, temperature 98.6 F (37 C), temperature source Oral, resp. rate 16, height 4\' 10"  (1.473 m), weight 59.3 kg, SpO2 100 %. PHYSICAL EXAMINATION:  Physical Exam  Constitutional: She is oriented to person, place, and time. She appears well-developed.  HENT:  Head: Normocephalic and atraumatic.  Right Ear: External ear normal.  Eyes: Pupils are equal, round, and reactive to light. Conjunctivae are normal. Right eye exhibits no discharge.  Neck: Normal range of motion. Neck supple. No thyromegaly present.  Cardiovascular: Normal rate and regular rhythm.  No murmur heard. Respiratory: Effort normal and breath sounds normal. No respiratory distress.  GI: Soft. Bowel sounds are normal. She exhibits no distension.  Musculoskeletal: Normal range of motion.        General: No edema.  Neurological: She is alert and oriented to person, place, and time. No cranial nerve deficit.  Skin: Skin is warm. She is not diaphoretic. No erythema.  Psychiatric: She has a normal mood and affect. Her behavior is normal.    LABORATORY PANEL:  Female CBC Recent Labs  Lab 05/06/19 0349  WBC 8.6  HGB 8.6*  HCT  27.3*  PLT 139*   ------------------------------------------------------------------------------------------------------------------ Chemistries  Recent Labs  Lab 05/06/19 0349  NA 140  K 3.9  CL 100  CO2 27  GLUCOSE 105*  BUN 43*  CREATININE 7.19*   CALCIUM 8.7*  MG 2.3   RADIOLOGY:  Dg Chest Port 1 View  Result Date: 05/06/2019 CLINICAL DATA:  73 y/o  F; shortness of breath. EXAM: PORTABLE CHEST 1 VIEW COMPARISON:  05/04/2019 chest radiograph. FINDINGS: Stable borderline enlarged cardiac silhouette given projection and technique. Aortic atherosclerosis with calcification. Interval development of hazy and reticular opacities of the lungs. No pleural effusion or pneumothorax. No acute osseous abnormality is evident. Right upper quadrant surgical clips, presumably cholecystectomy. IMPRESSION: Interval development of hazy and reticular opacities of the lungs, probably developing pulmonary edema. Aortic Atherosclerosis (ICD10-I70.0). Electronically Signed   By: Kristine Garbe M.D.   On: 05/06/2019 00:29   ASSESSMENT AND PLAN:   1.  Atypical chest pain and shortness of breath Patient with chronically mildly elevated troponin which is stable with recent troponin of 0.04.  No EKG changes. Patient seen by cardiologist.  Chest pain said to be secondary to costochondritis.  Nephrologist okay with initiation of prednisone.  To avoid NSAIDs since patient still making urine.   2D echocardiogram done with ejection fraction of 60 to 65%. Patient said to be slightly anemic with hemoglobin of 8.6.  Cardiologist noted patient has been on dual antiplatelet therapy with aspirin and Plavix since March 2019 due to concern for possible TIA/CVA in the past.  Cardiologist recommended discontinuing 1 of the antiplatelet therapy since no indication for dual therapy at this time.  Low-dose aspirin discontinued to decrease risk of bleeding and worsening of anemia  2.  End-stage renal disease on hemodialysis Nephrologist already consulted for inpatient hemodialysis  3.  Diabetes mellitus type 2 Sliding scale insulin coverage.  Glycosylated hemoglobin level of 4.5  4.  Hypertension Continue current blood pressure medications.  PRN IV hydralazine for systolic  blood pressure greater than 160.  Monitor and adjust regimen as needed  5.  History of chronic diastolic CHF Noted evidence of pulmonary edema on chest x-ray last night.  Likely has acute on chronic diastolic CHF Being managed with hemodialysis.  6.  Hyperlipidemia Continue statins  DVT prophylaxis; heparin    All the records are reviewed and case discussed with Care Management/Social Worker. Management plans discussed with the patient, family and they are in agreement.  CODE STATUS: Full Code  TOTAL TIME TAKING CARE OF THIS PATIENT: 34 minutes.   More than 50% of the time was spent in counseling/coordination of care: YES  POSSIBLE D/C IN 2 DAYS, DEPENDING ON CLINICAL CONDITION.   Dainelle Hun M.D on 05/06/2019 at 4:32 PM  Between 7am to 6pm - Pager - [phone]  After 6pm go to [email or web address] - Proofreader  [facility]  Office  [phone]  CC: Primary care physician; [name], MD  Note: This dictation was prepared with Dragon dictation along with smaller phrase technology. Any transcriptional errors that result from this process are unintentional.

## 2019-05-06 NOTE — Progress Notes (Signed)
Patient back from dialysis. Up to chair for change of position and dinner. Normally takes all of her missed daily meds after dialysis - ok to give per Dr. Juleen China (aware of BP). Chair alarm on. Belongings within reach. Will continue to monitor.

## 2019-05-06 NOTE — Progress Notes (Signed)
Progress Note  [name] Date of Encounter: 05/06/2019  Primary Cardiologist: Ida Rogue, MD   Subjective   Chest pain and shortness of breath improved today.  Inpatient Medications    Scheduled Meds:  amLODipine  10 mg Oral Daily   calcium acetate  1,334 mg Oral TID WC   carvedilol  12.5 mg Oral BID WC   clopidogrel  75 mg Oral Daily   epoetin (EPOGEN/PROCRIT) injection  10,000 Units Intravenous Q T,Th,Sa-HD   furosemide  80 mg Oral Daily   heparin  5,000 Units Subcutaneous Q8H   hydrALAZINE  25 mg Oral TID   insulin aspart  0-5 Units Subcutaneous QHS   insulin aspart  0-9 Units Subcutaneous TID WC   losartan  50 mg Oral Daily   mometasone-formoterol  2 puff Inhalation BID    morphine injection  2 mg Intravenous Once   oxybutynin  15 mg Oral Daily   pantoprazole  20 mg Oral BID AC   pravastatin  40 mg Oral QHS   sodium chloride flush  3 mL Intravenous Q12H   Continuous Infusions:  PRN Meds: acetaminophen **OR** acetaminophen, HYDROcodone-acetaminophen, labetalol, ondansetron **OR** ondansetron (ZOFRAN) IV   Vital Signs    Vitals:   05/06/19 0028 05/06/19 0102 05/06/19 0324 05/06/19 0738  BP: (!) 143/54  (!) 160/59 (!) 149/57  Pulse: 77  74 77  Resp:  (!) 22 20 18   Temp:   98.6 F (37 C) 98.2 F (36.8 C)  TempSrc:   Axillary Oral  SpO2: 100%  100% 100%  Weight:   59.3 kg   Height:        Intake/Output Summary (Last 24 hours) at 05/06/2019 1408 Last data filed at 05/06/2019 1330 Gross per 24 hour  Intake 1440 ml  Output 300 ml  Net 1140 ml   Last 3 Weights 05/06/2019 05/05/2019 05/04/2019  Weight (lbs) 130 lb 12.8 oz 129 lb 130 lb 1.1 oz  Weight (kg) 59.33 kg 58.514 kg 59 kg      Telemetry    Normal sinus rhythm and sinus tachycardia - Personally Reviewed  ECG   No new tracing  Physical Exam   GEN: No acute distress.   Neck: No JVD Cardiac: RRR, no murmurs, rubs, or gallops.  Respiratory: Clear to  auscultation bilaterally. GI: Soft, nontender, non-distended  MS: No edema; No deformity.  Anterior chest wall tenderness noted. Neuro:  Nonfocal  Psych: Normal affect   Labs    Chemistry Recent Labs  Lab 05/04/19 1450 05/04/19 2201 05/05/19 0434 05/06/19 0349  NA 141 142  --  140  K 3.7 3.6  --  3.9  CL 99 99  --  100  CO2 30 28  --  27  GLUCOSE 107* 152*  --  105*  BUN 26* 30*  --  43*  CREATININE 4.85* 5.35* 5.78* 7.19*  CALCIUM 9.0 9.1  --  8.7*  GFRNONAA 8* 7* 7* 5*  GFRAA 10* 9* 8* 6*  ANIONGAP 12 15  --  13     Hematology Recent Labs  Lab 05/04/19 2201 05/05/19 0434 05/06/19 0349  WBC 6.5 5.8 8.6  RBC 3.21* 2.87* 2.96*  HGB 9.5* 8.6* 8.6*  HCT 29.3* 26.5* 27.3*  MCV 91.3 92.3 92.2  MCH 29.6 30.0 29.1  MCHC 32.4 32.5 31.5  RDW 13.5 13.3 13.6  PLT 164 117* 139*    Cardiac Enzymes Recent Labs  Lab 05/04/19 1450 05/04/19 2201 05/05/19 0434 05/05/19 1026  TROPONINI 0.03* 0.03* 0.05* 0.04*   No results for input(s): TROPIPOC in the last 168 hours.   BNPNo results for input(s): BNP, PROBNP in the last 168 hours.   DDimer No results for input(s): DDIMER in the last 168 hours.   Radiology    Dg Chest 2 View  Result Date: 05/04/2019 CLINICAL DATA:  Patient reports two episodes of SOB. Reports first episode onset 5 days ago and second episode onset last night. Reports most recent episode has not resolved yet. Patient believes SOB is a result of costochondritis. Hx GERD, HTN, ESRD, DM, COPD, CHF, asthma. Non-smoker. EXAM: CHEST - 2 VIEW COMPARISON:  12/21/2018 FINDINGS: Cardiac silhouette is top-normal in size. No mediastinal or hilar masses. No evidence of adenopathy. There are thickened bilateral bronchovascular markings and mild interstitial thickening, similar to the prior exam. No evidence of pneumonia or pulmonary edema. No pleural effusion or pneumothorax. Skeletal structures are demineralized but intact. IMPRESSION: No acute cardiopulmonary disease.  Electronically Signed   By: Lajean Manes M.D.   On: 05/04/2019 15:52   Dg Chest Port 1 View  Result Date: 05/06/2019 CLINICAL DATA:  73 y/o  F; shortness of breath. EXAM: PORTABLE CHEST 1 VIEW COMPARISON:  05/04/2019 chest radiograph. FINDINGS: Stable borderline enlarged cardiac silhouette given projection and technique. Aortic atherosclerosis with calcification. Interval development of hazy and reticular opacities of the lungs. No pleural effusion or pneumothorax. No acute osseous abnormality is evident. Right upper quadrant surgical clips, presumably cholecystectomy. IMPRESSION: Interval development of hazy and reticular opacities of the lungs, probably developing pulmonary edema. Aortic Atherosclerosis (ICD10-I70.0). Electronically Signed   By: Kristine Garbe M.D.   On: 05/06/2019 00:29    Cardiac Studies   Echo (05/06/2019):  1. The left ventricle has normal systolic function with an ejection fraction of 60-65%. The cavity size was normal. Left ventricular diastolic Doppler parameters are consistent with pseudonormalization. Elevated mean left atrial pressure No evidence of  left ventricular regional wall motion abnormalities.  2. The right ventricle has normal systolic function. The cavity was normal. There is no increase in right ventricular wall thickness.  3. Left atrial size was mild-moderately dilated.  4. The aortic valve has an indeterminate number of cusps. Moderate thickening of the aortic valve. Moderate calcification of the aortic valve. Moderate stenosis of the aortic valve.  5. The mitral valve is degenerative. Mild thickening of the mitral valve leaflet. There is severe mitral annular calcification present. No evidence of mitral valve stenosis.  6. The aortic root is normal in size and structure.  7. The interatrial septum was not well visualized.  Patient Profile     73 y.o. female history of chronic HFpEF, arctic stenosis, hypertension, hyperlipidemia, ESRD, and  anemia of chronic disease, whom we are following due to atypical chest pain and shortness of breath.  Assessment & Plan    Atypical chest pain and elevated troponin: Chest pain is most consistent with the patient's history of costochondritis.  Troponin elevation peaking at 0.05 is negligible and most consistent with supply-demand mismatch in the setting of aortic stenosis, hypertension, and Nataliya Graig-stage renal disease.  I do not believe this represents acute coronary syndrome.  No further work-up is recommended at this time.  Management of chronic costochondritis per internal medicine.  Moderate aortic stenosis: There has been slight progression of aortic stenosis, which is moderate in severity.  I do not believe this alone explains the patient's symptoms.  Continued outpatient follow-up is recommended.  Acute on chronic HFpEF: The patient  appears euvolemic on exam today.  She should continue her scheduled hemodialysis.  Worsening anemia is likely also contributing to shortness of breath.  Anemia of chronic disease: Hemoglobin has been gradually trending down over the last 4 months.  I agree with Dr. Tyrell Antonio recommendation to transition to single antiplatelet therapy (either aspirin or clopidogrel).  Further management of anemia of chronic disease per internal medicine and nephrology.  Hypertension: Labile blood pressure again noted but only mildly elevated this morning.  Continue current medications, further titration may be needed based on blood pressure after dialysis today.  [facility] will sign off.   Medication Recommendations: Continue current medications. Other recommendations (labs, testing, etc): None. Follow up as an outpatient: 2-4 weeks with Dr. Rockey Situ  For questions or updates, please contact [place] Please consult [email or web address] for contact info under [facility].  Signed, [name], MD  05/06/2019, 2:08 PM

## 2019-05-06 NOTE — Progress Notes (Signed)
Post HD Tx   873mL fluid removal, tolerated tx well.    05/06/19 1645  Hand-Off documentation  Report given to (Full Name) Lovena Le RN 2A  Report received from (Full Name) Trellis Paganini RN  Vital Signs  Temp 98.5 F (36.9 C)  Temp Source Oral  Pulse Rate 73  Resp 16  BP (!) 158/55  Oxygen Therapy  SpO2 100 %  O2 Device Nasal Cannula  O2 Flow Rate (L/min) 3 L/min

## 2019-05-06 NOTE — Progress Notes (Signed)
HD Tx End     05/06/19 1630  Vital Signs  Pulse Rate 67  Resp 16  BP (!) 164/53  Oxygen Therapy  SpO2 100 %  O2 Device Nasal Cannula  O2 Flow Rate (L/min) 3 L/min  During Hemodialysis Assessment  Blood Flow Rate (mL/min) 200 mL/min  Arterial Pressure (mmHg) -100 mmHg  Venous Pressure (mmHg) 100 mmHg  Transmembrane Pressure (mmHg) 50 mmHg  Ultrafiltration Rate (mL/min) 560 mL/min  Dialysate Flow Rate (mL/min) 600 ml/min  Conductivity: Machine  14  HD Safety Checks Performed Yes  Dialysis Fluid Bolus Normal Saline  Bolus Amount (mL) 250 mL  Intra-Hemodialysis Comments Tx completed;Tolerated well  Post-Hemodialysis Assessment  Rinseback Volume (mL) 250 mL  Dialyzer Clearance Lightly streaked  Duration of HD Treatment -hour(s) 2.5 hour(s)  Hemodialysis Intake (mL) 500 mL  UF Total -Machine (mL) 1300 mL  Net UF (mL) 800 mL  Tolerated HD Treatment Yes  AVG/AVF Arterial Site Held (minutes) 5 minutes  AVG/AVF Venous Site Held (minutes) 5 minutes  Fistula / Graft Left Forearm Arteriovenous fistula  No Placement Date or Time found.   Placed prior to admission: Yes  Orientation: Left  Access Location: Forearm  Access Type: Arteriovenous fistula  Site Condition No complications  Fistula / Graft Assessment Present;Bruit;Thrill  Status Deaccessed  Drainage Description None

## 2019-05-06 NOTE — Progress Notes (Signed)
Pre HD Tx   05/06/19 1340  Hand-Off documentation  Report given to (Full Name) Trellis Paganini RN  Report received from (Full Name) Lovena Le RN 2A  Vital Signs  Temp 98.6 F (37 C)  Temp Source Oral  Pulse Rate 78  Pulse Rate Source Monitor  Resp 18  BP (!) 148/59  BP Location Right Arm  BP Method Automatic  Patient Position (if appropriate) Lying  Oxygen Therapy  SpO2 100 %  O2 Device Nasal Cannula  O2 Flow Rate (L/min) 3 L/min  Pain Assessment  Pain Scale 0-10  Pain Score 0  Dialysis Weight  Weight 59.3 kg  Type of Weight Pre-Dialysis  Time-Out for Hemodialysis  What Procedure? Hemodialysis  Pt Identifiers(min of two) First/Last Name;MRN/Account#  Correct Site? Yes  Correct Side? Yes  Correct Procedure? Yes  Consents Verified? Yes  Rad Studies Available? N/A  Safety Precautions Reviewed? Yes  Engineer, civil (consulting) Number 4  Station Number 2  UF/Alarm Test Passed  Conductivity: Meter 14  Conductivity: Machine  13.8  pH 7  Reverse Osmosis Main  Normal Saline Lot Number [id number]  Dialyzer Lot Number [id number]  Disposable Set Lot Number [id number]  Machine Temperature 98.6 F (37 C)  Musician and Audible Yes  Blood Lines Intact and Secured Yes  Pre Treatment Patient Checks  Vascular access used during treatment Fistula  Hepatitis B Surface Antigen Results Negative  Date Hepatitis B Surface Antigen Drawn 11/05/18  Hepatitis B Surface Antibody  (>10)  Date Hepatitis B Surface Antibody Drawn 11/05/18  Hemodialysis Consent Verified Yes  Hemodialysis Standing Orders Initiated Yes  ECG (Telemetry) Monitor On Yes  Prime Ordered Normal Saline  Length of  DialysisTreatment -hour(s) 2.5 Hour(s)  Dialysis Treatment Comments Na 140  Dialyzer Elisio 17H NR  Dialysate 3K;2.5 Ca  Dialysate Flow Ordered 600  Blood Flow Rate Ordered 350 mL/min  Ultrafiltration Goal 1 Liters  Dialysis Blood Pressure Support Ordered Normal Saline  Education / Care Plan  Dialysis  Education Provided Yes  Documented Education in Care Plan Yes  Fistula / Graft Left Forearm Arteriovenous fistula  No Placement Date or Time found.   Placed prior to admission: Yes  Orientation: Left  Access Location: Forearm  Access Type: Arteriovenous fistula  Site Condition No complications  Fistula / Graft Assessment Present;Bruit;Thrill  Status Accessed  Needle Size 16 (short needles)  Drainage Description None

## 2019-05-06 NOTE — Progress Notes (Signed)
*  PRELIMINARY RESULTS* Echocardiogram 2D Echocardiogram has been performed.  Phyllis [name] 05/06/2019, 11:52 AM

## 2019-05-06 NOTE — Progress Notes (Signed)
Post Tx Assessment    05/06/19 1700  Neurological  Level of Consciousness Alert  Orientation Level Oriented X4  Respiratory  Respiratory Pattern Regular  Chest Assessment Chest expansion symmetrical  Bilateral Breath Sounds Diminished  Cardiac  Heart Sounds Murmur  Cardiac Rhythm NSR  Vascular  R Radial Pulse +2  L Radial Pulse +2  Integumentary  Integumentary (WDL) X  Skin Color Appropriate for ethnicity  Skin Condition Dry  Skin Integrity Ecchymosis  Ecchymosis Location Arm  Ecchymosis Location Orientation Bilateral  Musculoskeletal  Musculoskeletal (WDL) X  Generalized Weakness Yes  GU Assessment  Genitourinary (WDL) X  Genitourinary Symptoms Oliguria (HD pt)  Urine Characteristics  Urine Color Yellow/straw  Urine Appearance Clear  Psychosocial  Psychosocial (WDL) WDL

## 2019-05-06 NOTE — Progress Notes (Signed)
[facility]  ROUNDING NOTE   Subjective:   Chest pain and shortness of breath. Given IV labetalol and IV furosemide.  Hemodialysis scheduled for later today.  Objective:  Vital signs in last 24 hours:  Temp:  [98.2 F (36.8 C)-98.6 F (37 C)] 98.2 F (36.8 C) (06/16 0738) Pulse Rate:  [36-82] 77 (06/16 0738) Resp:  [18-40] 18 (06/16 0738) BP: (143-231)/(47-68) 149/57 (06/16 0738) SpO2:  [97 %-100 %] 100 % (06/16 0738) Weight:  [59.3 kg] 59.3 kg (06/16 0324)  Weight change: 0.331 kg Filed Weights   05/04/19 2154 05/05/19 0200 05/06/19 0324  Weight: 59 kg 58.5 kg 59.3 kg    Intake/Output: I/O last 3 completed shifts: In: 963 [P.O.:960; I.V.:3] Out: 425 [Urine:425]   Intake/Output this shift:  Total I/O In: 480 [P.O.:480] Out: 200 [Urine:200]  Physical Exam: General: NAD,   Head: Normocephalic, atraumatic. Moist oral mucosal membranes  Eyes: Anicteric, PERRL  Neck: Supple, trachea midline  Chest Tender to palpation  Lungs:  Clear to auscultation  Heart: Regular rate and rhythm  Abdomen:  Soft, nontender,   Extremities: no peripheral edema.  Neurologic: Nonfocal, moving all four extremities  Skin: No lesions  Access: Left AVF    Basic Metabolic Panel: Recent Labs  Lab 05/04/19 1450 05/04/19 2201 05/05/19 0434 05/06/19 0349  NA 141 142  --  140  K 3.7 3.6  --  3.9  CL 99 99  --  100  CO2 30 28  --  27  GLUCOSE 107* 152*  --  105*  BUN 26* 30*  --  43*  CREATININE 4.85* 5.35* 5.78* 7.19*  CALCIUM 9.0 9.1  --  8.7*  MG  --   --   --  2.3    Liver Function Tests: No results for input(s): AST, ALT, ALKPHOS, BILITOT, PROT, ALBUMIN in the last 168 hours. No results for input(s): LIPASE, AMYLASE in the last 168 hours. No results for input(s): AMMONIA in the last 168 hours.  CBC: Recent Labs  Lab 05/04/19 1450 05/04/19 2201 05/05/19 0434  WBC 6.0 6.5 5.8  NEUTROABS 4.2  --   --   HGB 9.8* 9.5* 8.6*  HCT 29.9* 29.3* 26.5*  MCV 89.5  91.3 92.3  PLT 182 164 117*    Cardiac Enzymes: Recent Labs  Lab 05/04/19 1450 05/04/19 2201 05/05/19 0434 05/05/19 1026  TROPONINI 0.03* 0.03* 0.05* 0.04*    BNP: Invalid input(s): POCBNP  CBG: Recent Labs  Lab 05/05/19 1134 05/05/19 1638 05/05/19 2039 05/06/19 0739 05/06/19 1107  GLUCAP 115* 111* 136* 100* 99    Microbiology: Results for orders placed or performed during the hospital encounter of 05/04/19  SARS Coronavirus 2 (CEPHEID - Performed in [place] hospital lab), Hosp Order     Status: None   Collection Time: 05/04/19 11:43 PM   Specimen: Nasopharyngeal Swab  Result Value Ref Range Status   SARS Coronavirus 2 NEGATIVE NEGATIVE Final    Comment: (NOTE) If result is NEGATIVE SARS-CoV-2 target nucleic acids are NOT DETECTED. The SARS-CoV-2 RNA is generally detectable in upper and lower  respiratory specimens during the acute phase of infection. The lowest  concentration of SARS-CoV-2 viral copies this assay can detect is 250  copies / mL. A negative result does not preclude SARS-CoV-2 infection  and should not be used as the sole basis for treatment or other  patient management decisions.  A negative result may occur with  improper specimen collection / handling, submission of specimen other  than nasopharyngeal swab, presence of viral mutation(s) within the  areas targeted by this assay, and inadequate number of viral copies  (<250 copies / mL). A negative result must be combined with clinical  observations, patient history, and epidemiological information. If result is POSITIVE SARS-CoV-2 target nucleic acids are DETECTED. The SARS-CoV-2 RNA is generally detectable in upper and lower  respiratory specimens dur ing the acute phase of infection.  Positive  results are indicative of active infection with SARS-CoV-2.  Clinical  correlation with patient history and other diagnostic information is  necessary to determine patient infection status.   Positive results do  not rule out bacterial infection or co-infection with other viruses. If result is PRESUMPTIVE POSTIVE SARS-CoV-2 nucleic acids MAY BE PRESENT.   A presumptive positive result was obtained on the submitted specimen  and confirmed on repeat testing.  While 2019 novel coronavirus  (SARS-CoV-2) nucleic acids may be present in the submitted sample  additional confirmatory testing may be necessary for epidemiological  and / or clinical management purposes  to differentiate between  SARS-CoV-2 and other Sarbecovirus currently known to infect humans.  If clinically indicated additional testing with an alternate test  methodology [phone]) is advised. The SARS-CoV-2 RNA is generally  detectable in upper and lower respiratory sp ecimens during the acute  phase of infection. The expected result is Negative. Fact Sheet for Patients:  StrictlyIdeas.no Fact Sheet for Healthcare Providers: BankingDealers.co.za This test is not yet approved or cleared by the [place] FDA and has been authorized for detection and/or diagnosis of SARS-CoV-2 by FDA under an Emergency Use Authorization (EUA).  This EUA will remain in effect (meaning this test can be used) for the duration of the COVID-19 declaration under Section 564(b)(1) of the Act, 21 U.S.C. section 360bbb-3(b)(1), unless the authorization is terminated or revoked sooner. Performed at [facility], [address] [place], [place] [address]   MRSA PCR Screening     Status: None   Collection Time: 05/06/19  8:55 AM   Specimen: Nasopharyngeal  Result Value Ref Range Status   MRSA by PCR NEGATIVE NEGATIVE Final    Comment:        The GeneXpert MRSA Assay (FDA approved for NASAL specimens only), is one component of a comprehensive MRSA colonization surveillance program. It is not intended to diagnose MRSA infection nor to guide or monitor treatment for MRSA  infections. Performed at [facility], [address] [place], [place] [address]     Coagulation Studies: No results for input(s): LABPROT, INR in the last 72 hours.  Urinalysis: No results for input(s): COLORURINE, LABSPEC, PHURINE, GLUCOSEU, HGBUR, BILIRUBINUR, KETONESUR, PROTEINUR, UROBILINOGEN, NITRITE, LEUKOCYTESUR in the last 72 hours.  Invalid input(s): APPERANCEUR    Imaging: Dg Chest 2 View  Result Date: 05/04/2019 CLINICAL DATA:  Patient reports two episodes of SOB. Reports first episode onset 5 days ago and second episode onset last night. Reports most recent episode has not resolved yet. Patient believes SOB is a result of costochondritis. Hx GERD, HTN, ESRD, DM, COPD, CHF, asthma. Non-smoker. EXAM: CHEST - 2 VIEW COMPARISON:  12/21/2018 FINDINGS: Cardiac silhouette is top-normal in size. No mediastinal or hilar masses. No evidence of adenopathy. There are thickened bilateral bronchovascular markings and mild interstitial thickening, similar to the prior exam. No evidence of pneumonia or pulmonary edema. No pleural effusion or pneumothorax. Skeletal structures are demineralized but intact. IMPRESSION: No acute cardiopulmonary disease. Electronically Signed   By: Lajean Manes M.D.   On:  05/04/2019 15:52   Dg Chest Port 1 View  Result Date: 05/06/2019 CLINICAL DATA:  73 y/o  F; shortness of breath. EXAM: PORTABLE CHEST 1 VIEW COMPARISON:  05/04/2019 chest radiograph. FINDINGS: Stable borderline enlarged cardiac silhouette given projection and technique. Aortic atherosclerosis with calcification. Interval development of hazy and reticular opacities of the lungs. No pleural effusion or pneumothorax. No acute osseous abnormality is evident. Right upper quadrant surgical clips, presumably cholecystectomy. IMPRESSION: Interval development of hazy and reticular opacities of the lungs, probably developing pulmonary edema. Aortic Atherosclerosis (ICD10-I70.0). Electronically Signed    By: Kristine Garbe M.D.   On: 05/06/2019 00:29     Medications:    . amLODipine  10 mg Oral Daily  . calcium acetate  1,334 mg Oral TID WC  . carvedilol  12.5 mg Oral BID WC  . clopidogrel  75 mg Oral Daily  . epoetin (EPOGEN/PROCRIT) injection  10,000 Units Intravenous Q T,Th,Sa-HD  . furosemide  80 mg Oral Daily  . heparin  5,000 Units Subcutaneous Q8H  . hydrALAZINE  25 mg Oral TID  . insulin aspart  0-5 Units Subcutaneous QHS  . insulin aspart  0-9 Units Subcutaneous TID WC  . losartan  50 mg Oral Daily  . mometasone-formoterol  2 puff Inhalation BID  .  morphine injection  2 mg Intravenous Once  . oxybutynin  15 mg Oral Daily  . pantoprazole  20 mg Oral BID AC  . pravastatin  40 mg Oral QHS  . sodium chloride flush  3 mL Intravenous Q12H   acetaminophen **OR** acetaminophen, HYDROcodone-acetaminophen, labetalol, ondansetron **OR** ondansetron (ZOFRAN) IV  Assessment/ Plan:  Phyllis [name] is a 73 y.o. white female with end stage renal disease on hemodialysis, hypertension, diabetes mellitus type II, overactive bladder, gout, COPD, congestive heart failure, coronary artery disease  [facility] TTS [address] 58.5kg Left AVF  1. End Stage Renal Disease: on hemodialysis.  Dialysis for later today. Orders prepared.   2. Hypertension: 149/57  Home regimen of amlodipine, carvedilol, furosemide, hydralazine, losartan.   3. Anemia with chronic kidney disease: hemoglobin 8.6 - EPO with HD treatment  4. Secondary Hyperparathyroidism: labs from 04/29/19 PTH 391, phosphorus 4.9, calcium 9.5 - Calcium acetate with meals.    LOS: 0 Phyllis [name] 6/16/202011:39 AM

## 2019-05-06 NOTE — Progress Notes (Signed)
HD Tx Start   05/06/19 1405  Vital Signs  Pulse Rate 70  Resp 18  BP (!) 150/51  Oxygen Therapy  SpO2 100 %  O2 Device Nasal Cannula  O2 Flow Rate (L/min) 3 L/min  During Hemodialysis Assessment  Blood Flow Rate (mL/min) 350 mL/min  Arterial Pressure (mmHg) -130 mmHg  Venous Pressure (mmHg) 150 mmHg  Transmembrane Pressure (mmHg) 50 mmHg  Ultrafiltration Rate (mL/min) 560 mL/min  Dialysate Flow Rate (mL/min) 600 ml/min  Conductivity: Machine  14  HD Safety Checks Performed Yes  Dialysis Fluid Bolus Normal Saline  Bolus Amount (mL) 250 mL  Intra-Hemodialysis Comments Tx initiated

## 2019-05-06 NOTE — Progress Notes (Signed)
Pt c/o chest discomfort and requested PRN pain medications. Pt also stated that she felt like it was harder to breathe when she had the chest pain. Pt given PRN tylenol and per her request a cold compress. Pt had no other concerns and was in no noted distress. Pt stated she would wait for the PRN Norco that was due shortly. Lung sounds slightly wheezy but O2 level  99-100%. Pt called staff into room 10 minutes later in noted respiratory distress with audible wheezing, course crackles, and BP 231/68. RT put pt on non rebreather, MD Willis notified. STAT chest Xray ordered and 10 ml IV labetalol given. Pt BP decreased to 143/54. IV Lasix 40 mg ordered and one inch nitropaste applied. At this time pt is resting quietly. Lung sounds still have crackles but no audible wheezes heard. Will continue to monitor breathing and output.

## 2019-05-06 NOTE — Plan of Care (Signed)
Patient has rested quietly this shift with no acute distress. Mild nausea and pain relieved with PRN medications this morning. Patient now in dialysis. Will assess when she returns.

## 2019-05-07 ENCOUNTER — Inpatient Hospital Stay: Payer: Medicare Other

## 2019-05-07 LAB — GLUCOSE, CAPILLARY
Glucose-Capillary: 146 mg/dL — ABNORMAL HIGH (ref 70–99)
Glucose-Capillary: 253 mg/dL — ABNORMAL HIGH (ref 70–99)
Glucose-Capillary: 151 mg/dL — ABNORMAL HIGH (ref 70–99)
Glucose-Capillary: 178 mg/dL — ABNORMAL HIGH (ref 70–99)

## 2019-05-07 LAB — BASIC METABOLIC PANEL
Anion gap: 12 (ref 5–15)
BUN: 25 mg/dL — ABNORMAL HIGH (ref 8–23)
CO2: 27 mmol/L (ref 22–32)
Calcium: 9.2 mg/dL (ref 8.9–10.3)
Chloride: 101 mmol/L (ref 98–111)
Creatinine, Ser: 4.47 mg/dL — ABNORMAL HIGH (ref 0.44–1.00)
GFR calc Af Amer: 11 mL/min — ABNORMAL LOW (ref >60)
GFR calc non Af Amer: 9 mL/min — ABNORMAL LOW (ref >60)
Glucose, Bld: 166 mg/dL — ABNORMAL HIGH (ref 70–99)
Potassium: 4.5 mmol/L (ref 3.5–5.1)
Sodium: 140 mmol/L (ref 135–145)

## 2019-05-07 LAB — MAGNESIUM: Magnesium: 2.2 mg/dL (ref 1.7–2.4)

## 2019-05-07 MED ORDER — PREDNISONE 20 MG PO TABS: 40.0000 mg | ORAL_TABLET | Freq: Every day | ORAL | 0 refills | Status: AC

## 2019-05-07 NOTE — Progress Notes (Signed)
[facility]  ROUNDING NOTE   Subjective:   Hemodialysis treatment yesterday. Tolerated treatment well. UF 836mL  Chest pain has improved with prednisone.   Objective:  Vital signs in last 24 hours:  Temp:  [98.5 F (36.9 C)-99.2 F (37.3 C)] 99 F (37.2 C) (06/17 0727) Pulse Rate:  [66-80] 76 (06/17 0727) Resp:  [16-20] 19 (06/17 0727) BP: (125-170)/(47-69) 160/55 (06/17 0727) SpO2:  [95 %-100 %] 95 % (06/17 0930) Weight:  [58.1 kg-59.3 kg] 58.2 kg (06/17 0537)  Weight change: -0.031 kg Filed Weights   05/06/19 1340 05/06/19 1729 05/07/19 0537  Weight: 59.3 kg 58.1 kg 58.2 kg    Intake/Output: I/O last 3 completed shifts: In: 960 [P.O.:960] Out: 1100 [Urine:300; Other:800]   Intake/Output this shift:  Total I/O In: 240 [P.O.:240] Out: -   Physical Exam: General: NAD,   Head: Normocephalic, atraumatic. Moist oral mucosal membranes  Eyes: Anicteric, PERRL  Neck: Supple, trachea midline  Chest Tender to palpation  Lungs:  Clear to auscultation  Heart: Regular rate and rhythm  Abdomen:  Soft, nontender,   Extremities: no peripheral edema.  Neurologic: Nonfocal, moving all four extremities  Skin: No lesions  Access: Left AVF    Basic Metabolic Panel: Recent Labs  Lab 05/04/19 1450 05/04/19 2201 05/05/19 0434 05/06/19 0349 05/07/19 0244  NA 141 142  --  140 140  K 3.7 3.6  --  3.9 4.5  CL 99 99  --  100 101  CO2 30 28  --  27 27  GLUCOSE 107* 152*  --  105* 166*  BUN 26* 30*  --  43* 25*  CREATININE 4.85* 5.35* 5.78* 7.19* 4.47*  CALCIUM 9.0 9.1  --  8.7* 9.2  MG  --   --   --  2.3 2.2    Liver Function Tests: No results for input(s): AST, ALT, ALKPHOS, BILITOT, PROT, ALBUMIN in the last 168 hours. No results for input(s): LIPASE, AMYLASE in the last 168 hours. No results for input(s): AMMONIA in the last 168 hours.  CBC: Recent Labs  Lab 05/04/19 1450 05/04/19 2201 05/05/19 0434 05/06/19 0349  WBC 6.0 6.5 5.8 8.6  NEUTROABS  4.2  --   --   --   HGB 9.8* 9.5* 8.6* 8.6*  HCT 29.9* 29.3* 26.5* 27.3*  MCV 89.5 91.3 92.3 92.2  PLT 182 164 117* 139*    Cardiac Enzymes: Recent Labs  Lab 05/04/19 1450 05/04/19 2201 05/05/19 0434 05/05/19 1026  TROPONINI 0.03* 0.03* 0.05* 0.04*    BNP: Invalid input(s): POCBNP  CBG: Recent Labs  Lab 05/06/19 1107 05/06/19 1735 05/06/19 2058 05/07/19 0728 05/07/19 1121  GLUCAP 99 83 207* 146* 253*    Microbiology: Results for orders placed or performed during the hospital encounter of 05/04/19  SARS Coronavirus 2 (CEPHEID - Performed in [place] hospital lab), Hosp Order     Status: None   Collection Time: 05/04/19 11:43 PM   Specimen: Nasopharyngeal Swab  Result Value Ref Range Status   SARS Coronavirus 2 NEGATIVE NEGATIVE Final    Comment: (NOTE) If result is NEGATIVE SARS-CoV-2 target nucleic acids are NOT DETECTED. The SARS-CoV-2 RNA is generally detectable in upper and lower  respiratory specimens during the acute phase of infection. The lowest  concentration of SARS-CoV-2 viral copies this assay can detect is 250  copies / mL. A negative result does not preclude SARS-CoV-2 infection  and should not be used as the sole basis for treatment or other  patient management decisions.  A negative result may occur with  improper specimen collection / handling, submission of specimen other  than nasopharyngeal swab, presence of viral mutation(s) within the  areas targeted by this assay, and inadequate number of viral copies  (<250 copies / mL). A negative result must be combined with clinical  observations, patient history, and epidemiological information. If result is POSITIVE SARS-CoV-2 target nucleic acids are DETECTED. The SARS-CoV-2 RNA is generally detectable in upper and lower  respiratory specimens dur ing the acute phase of infection.  Positive  results are indicative of active infection with SARS-CoV-2.  Clinical  correlation with patient history  and other diagnostic information is  necessary to determine patient infection status.  Positive results do  not rule out bacterial infection or co-infection with other viruses. If result is PRESUMPTIVE POSTIVE SARS-CoV-2 nucleic acids MAY BE PRESENT.   A presumptive positive result was obtained on the submitted specimen  and confirmed on repeat testing.  While 2019 novel coronavirus  (SARS-CoV-2) nucleic acids may be present in the submitted sample  additional confirmatory testing may be necessary for epidemiological  and / or clinical management purposes  to differentiate between  SARS-CoV-2 and other Sarbecovirus currently known to infect humans.  If clinically indicated additional testing with an alternate test  methodology [phone]) is advised. The SARS-CoV-2 RNA is generally  detectable in upper and lower respiratory sp ecimens during the acute  phase of infection. The expected result is Negative. Fact Sheet for Patients:  StrictlyIdeas.no Fact Sheet for Healthcare Providers: BankingDealers.co.za This test is not yet approved or cleared by the [place] FDA and has been authorized for detection and/or diagnosis of SARS-CoV-2 by FDA under an Emergency Use Authorization (EUA).  This EUA will remain in effect (meaning this test can be used) for the duration of the COVID-19 declaration under Section 564(b)(1) of the Act, 21 U.S.C. section 360bbb-3(b)(1), unless the authorization is terminated or revoked sooner. Performed at [facility], [address] [place], [place] [address]   MRSA PCR Screening     Status: None   Collection Time: 05/06/19  8:55 AM   Specimen: Nasopharyngeal  Result Value Ref Range Status   MRSA by PCR NEGATIVE NEGATIVE Final    Comment:        The GeneXpert MRSA Assay (FDA approved for NASAL specimens only), is one component of a comprehensive MRSA colonization surveillance program. It is  not intended to diagnose MRSA infection nor to guide or monitor treatment for MRSA infections. Performed at [facility], [address] [place],  [address]     Coagulation Studies: No results for input(s): LABPROT, INR in the last 72 hours.  Urinalysis: No results for input(s): COLORURINE, LABSPEC, PHURINE, GLUCOSEU, HGBUR, BILIRUBINUR, KETONESUR, PROTEINUR, UROBILINOGEN, NITRITE, LEUKOCYTESUR in the last 72 hours.  Invalid input(s): APPERANCEUR    Imaging: Dg Chest 1 View  Result Date: 05/07/2019 CLINICAL DATA:  Shortness of breath. EXAM: CHEST  1 VIEW COMPARISON:  05/06/2019. FINDINGS: Mediastinum hilar structures normal. Cardiomegaly. Bilateral interstitial prominence, improved from prior exam. Findings suggest improving CHF and/or pneumonitis. No pleural effusion or pneumothorax. IMPRESSION: Cardiomegaly with mild pulmonary interstitial prominence. Improved interstitial prominence from prior exam. Findings most consistent with improving CHF. Electronically Signed   By: Marcello Moores  Register   On: 05/07/2019 12:16   Dg Chest Port 1 View  Result Date: 05/06/2019 CLINICAL DATA:  73 y/o  F; shortness of breath. EXAM: PORTABLE CHEST 1 VIEW COMPARISON:  05/04/2019 chest  radiograph. FINDINGS: Stable borderline enlarged cardiac silhouette given projection and technique. Aortic atherosclerosis with calcification. Interval development of hazy and reticular opacities of the lungs. No pleural effusion or pneumothorax. No acute osseous abnormality is evident. Right upper quadrant surgical clips, presumably cholecystectomy. IMPRESSION: Interval development of hazy and reticular opacities of the lungs, probably developing pulmonary edema. Aortic Atherosclerosis (ICD10-I70.0). Electronically Signed   By: Kristine Garbe M.D.   On: 05/06/2019 00:29     Medications:    . amLODipine  10 mg Oral Daily  . calcium acetate  1,334 mg Oral TID WC  . carvedilol  12.5 mg Oral BID WC   . clopidogrel  75 mg Oral Daily  . furosemide  80 mg Oral Daily  . heparin  5,000 Units Subcutaneous Q8H  . hydrALAZINE  25 mg Oral TID  . insulin aspart  0-5 Units Subcutaneous QHS  . insulin aspart  0-9 Units Subcutaneous TID WC  . losartan  50 mg Oral Daily  . mometasone-formoterol  2 puff Inhalation BID  .  morphine injection  2 mg Intravenous Once  . oxybutynin  15 mg Oral Daily  . pantoprazole  20 mg Oral BID AC  . pravastatin  40 mg Oral QHS  . predniSONE  40 mg Oral Q breakfast  . sodium chloride flush  3 mL Intravenous Q12H   acetaminophen **OR** acetaminophen, HYDROcodone-acetaminophen, labetalol, ondansetron **OR** ondansetron (ZOFRAN) IV  Assessment/ Plan:  [name] is a 73 y.o. white female with end stage renal disease on hemodialysis, hypertension, diabetes mellitus type II, overactive bladder, gout, COPD, congestive heart failure, coronary artery disease  [facility] TTS [address] 58.5kg Left AVF  1. End Stage Renal Disease: on hemodialysis.  Continue TTS schedule  2. Hypertension:  Home regimen of amlodipine, carvedilol, furosemide, hydralazine, losartan.   3. Anemia with chronic kidney disease:   - EPO with HD treatment  4. Secondary Hyperparathyroidism: labs from 04/29/19 PTH 391, phosphorus 4.9, calcium 9.5 - Calcium acetate with meals.   5. Costochondritis:  Appreciate cardiology input.  PO prednisone   LOS: 1 Phyllis [name] 6/17/202012:19 PM

## 2019-05-07 NOTE — TOC Transition Note (Signed)
Transition of Care [facility]) - CM/SW Discharge Note   Patient Details  Name: Phyllis [name] MRN: [id number] Date of Birth: Mar 16, 1946  [facility] CM/SW Contact:  Beverly Sessions, RN Phone Number: 05/07/2019, 3:25 PM   Clinical Narrative:     Updated that patient discharge cancelled.  Plan for patient to discharge after HD tomorrow.   MD agreeable to home health RN services at discharge.  Patient agreeable.  Patient states that she has had Amy Pope with [facility] before and would like to use her again. Heads up referral made to [facility] with [place]   Final next level of care: [place] [facility] Barriers to Discharge: Other (comment)(pt c/o continue chest pain)   Patient Goals and CMS Choice     Choice offered to / list presented to : Patient  Discharge Placement                       Discharge Plan and Services   Discharge Planning Services: Homebound not met per provider                      HH Arranged: RN [facility] Agency: [place] ([facility]) Date [place]: 05/07/19 Time [place]: [place] Representative spoke with at [place]: [place] ([place]) Interventions     Readmission Risk Interventions Readmission Risk Prevention Plan 05/07/2019  Transportation Screening Complete  Medication Review [employer]) Complete  PCP or Specialist appointment within 3-5 days of discharge Complete  Palliative Care Screening Not [place] Not Applicable  Some recent data might be hidden

## 2019-05-07 NOTE — Progress Notes (Signed)
Cardiac Rehab Navigator/ Exercise Physiologist Note  Patient presented with chest pain due to costochondritis that flares up occasionally. This EP will educate patient according to the Assessment and Plan of Acute on Chronic Diastolic CHF. Patient reports not having this education previously.   CHF Education:??  Educational session with patient completed.  Provided patient with "Living Better with Heart Failure" packet. Briefly reviewed definition of heart failure and signs and symptoms of an exacerbation. Discussed potential causes of CHF.  Explained to patient that HF is a chronic illness which requires self-assessment / self-management along with help from the cardiologist/PCP. Discussed definition of EF measurement along with normal value compared to patient's EF 60-65%.?  *Reviewed importance of and reason behind checking weight daily in the AM, after using the bathroom, but before getting dressed. Patient has hemodialysis 3 days per week Tuesday, Thursday, and Saturday. Patient is weighed there and her dialysis is adjusted according to weight.  *Reviewed with patient the following information:  *Discussed when to call the Dr= weight gain of >2-3lb overnight of 5lb in a week,  *Discussed yellow zone= call MD: weight gain of >2-3lb overnight of 5lb in a week, increased swelling, increased SOB when lying down, chest discomfort, dizziness, increased fatigue  *Red Zone= call 911: struggle to breath, fainting or near fainting, significant chest pain.  *Heart Failure Zone Magnet given and reviewed with patient.   *Diet - Patient currently ordered heart healthy. Referral for Dietitian Consultation for diet education has been ordered. Instructed patient to follow a low sodium diet of 2000 mg or less. Recommended foods for low sodium heart healthy nutrition nutrition therapy discussed. Reviewed with patient steps to reading a food label with close attention to serving size and mg of sodium.  ?  *Discussed fluid intake with patient as well. Patient on fluid restriction currently on 1200 ml fluid restriction.  Demonstrated this volume to patient using the bedside water pitcher. ?  *Instructed patient to take medications as prescribed for heart failure. Explained briefly why patient is on the medications (either make you feel better, live longer or keep you out of the hospital) and discussed monitoring and side effects.?  *Discussed exercise / activity. Patient is currently sedentary. Patient is not interested in participating in a Pulmonary Rehab program at this time. Patient has a lot of chest pain when she uses her arms. Patient was given program brochure and encouraged to attend the program when she is feeling better. Patient sees Dr. Raul Del for Pulmonology and Dr. Rockey Situ for follow up to this visit. Encouraged patient to be as active as possible.?  *Smoking Cessation- Patient is a NEVER smoker.?  *[facility] - Explained the purpose of the HF Clinic. Explained to patient the HF Clinic does not replace PCP nor Cardiologist, but is an additional resource to helping patient manage heart failure at home. Patient is an established patient in the [facility].  Next appt scheduled for 08/24 at 1 pm.   Again, the 5 Steps to Living Better with Heart Failure were reviewed with patient.  Patient thanked me for providing the above information. ?  Phyllis [name], [facility]  Exercise Physiologist Department Phone #: [phone] Fax: [phone]  Direct Line [phone] Email Address: [email or web address]

## 2019-05-07 NOTE — Discharge Summary (Addendum)
[facility] [place] NAME: Phyllis [name]    MR#:  [id number]  DATE OF BIRTH:  February 18, 1946  DATE OF ADMISSION:  05/04/2019   ADMITTING PHYSICIAN: Lance Coon, MD  DATE OF DISCHARGE: 05/07/2019  PRIMARY CARE PHYSICIAN: [name], MD   ADMISSION DIAGNOSIS:  SOB (shortness of breath) [R06.02] Chest pain, unspecified type [R07.9] DISCHARGE DIAGNOSIS:  Principal Problem:   Chest pain Active Problems:   SOB (shortness of breath)   End stage renal disease on dialysis ([place])   Type 2 diabetes mellitus with other specified complication (HCC)   Hyperlipidemia   Uncontrolled hypertension   Chronic diastolic heart failure ([place])  SECONDARY DIAGNOSIS:   Past Medical History:  Diagnosis Date  . Allergy   . Anemia of chronic disease   . Anxiety   . Aortic atherosclerosis ([place])   . Asthma   . CHF (congestive heart failure) ([place])   . Chronic back pain   . COPD (chronic obstructive pulmonary disease) ([place])   . Diabetes mellitus with complication ([place])   . ESRD on hemodialysis ([place])    a. Tues/Sat; b. 2/2 small kidneys  . Essential hypertension   . Fistula    lower left arm  . GERD (gastroesophageal reflux disease)   . Gout   . History of echocardiogram    a. TTE 01/2014: nl LV sys fxn, no valvular abnormalities; b. TTE 11/16: nl EF, mild LVH  . History of exercise stress test    a. 01/2014: no evidence of ischemia; b. Lexiscan 08/2015: no sig ischemia, severe GI uptake artifact, low risk; c. CPET @ [facility] 09/2016: exercised 3 min 12 sec on bike without incline, 2.28 METs, VO2 of 8.1, 48% of predicted, indicating mod to sev functional impairment, evidence of blunted HR, stroke volume, and BP augmentation as well as ventilation-perfusion mismatch with exercise  . HLD (hyperlipidemia)   . 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 Renal insufficiency 09/24/2017   Dialysis patient.  . Sleep apnea    HOSPITAL  COURSE:  Chief complaint; shortness of breath   History of presenting complaint; Phyllis [name]  is a 73 y.o. female who presented to the ED with a complaint of shortness of breath.  She was in the ED initially early in the afternoon with this complaint.  Work-up was largely unrevealing and she was discharged home.  She states that when she got home she became much more short of breath again and called EMS.  EMS said her oxygen saturations were in the 80s when they arrived at her home.  Here in the ED her evaluation is much improved, but she does complain of chest pain.  She states that she has chronic costochondritis, and that this pain feels the same as her chronic pain.  However, she states she is more short of breath.  Work-up is again largely unrevealing.  However, she is significantly hypertensive.  The chest x-ray is clear, troponin is barely elevated which seems to be her baseline based on chart review.  Hospitalist called for admission and further evaluation   Hospital course; 1.  Atypical chest pain and shortness of breath Patient with chronically mildly elevated troponin which is stable with recent troponin of 0.04.  No EKG changes.Patient seen by cardiologist.  Chest pain said to be secondary to costochondritis.  Patient with known history of costochondritis. Nephrologist okay with initiation of prednisone.  To avoid NSAIDs since patient still making  urine.  Patient started on prednisone 40 mg p.o. daily yesterday with significant improvement already.  Prescription for prednisone for a few more days given.  Denies any shortness of breath this morning following hemodialysis yesterday.  2D echocardiogram done with ejection fraction of 60 to 65%. Patient said to be slightly anemic with hemoglobin of 8.6.  Cardiologist Dr Fletcher Anon noted patient has been on dual antiplatelet therapy with aspirin and Plavix since March 2019 due to concern for possible TIA/CVA in the past.  Cardiologist recommended  discontinuing 1 antiplatelet therapy since no indication for dual therapy at this time.  Low-dose aspirin discontinued to decrease risk of bleeding and worsening of anemia. Appointment made to follow-up with her cardiologist Dr. Rockey Situ in 2 weeks as recommended  2.  End-stage renal disease on hemodialysis Nephrologist already consulted for inpatient hemodialysis.  Patient to continue with outpatient hemodialysis on discharge  3.  Diabetes mellitus type 2  Glycosylated hemoglobin level of 4.5 suggestive of well-controlled diabetes mellitus  4.  Hypertension Continue home regimen and follow-up with primary care physician for monitoring  5.  History of chronic diastolic CHF Noted evidence of pulmonary edema on chest x-ray 2 nights ago.  Likely has acute on chronic diastolic CHF.  Patient had hemodialysis yesterday with significant improvement.  Denies any shortness of breath this morning.  Already weaned off oxygen with oxygen saturation of 95% on room air this morning.  6.  Hyperlipidemia Continue statins  I called patient's family/friend listed as a contact for update but no response.  Left voice message.  Plan was for discharge today.  I was later notified by nursing staff that patient was complaining of shortness of breath.  Repeat chest x-ray done reviewed improving CHF.  Patient requests waiting to have hemodialysis done tomorrow for further improvement in shortness of breath before discharge.  Nephrologist okay with plans to hold off on discharge for today.  DISCHARGE CONDITIONS:  Stable CONSULTS OBTAINED:  Treatment Team:  Lavonia Dana, MD DRUG ALLERGIES:   Allergies  Allergen Reactions  . Codeine Nausea And Vomiting and Nausea Only  . Enalapril Maleate Other (See Comments)    Other reaction(s): Headache  . Nitrofurantoin Swelling and Rash    Other Reaction: swelling of body  . Sulfamethoxazole-Trimethoprim Swelling  . Vicodin [Hydrocodone-Acetaminophen] Nausea And  Vomiting and Nausea Only  . 2,4-D Dimethylamine (Amisol) Rash and Other (See Comments)    Other Reaction: h/a  . Baclofen Other (See Comments) and Nausea Only    lightheadness ,drowsiness , muscle weakness , twitching in hands   . Neosporin [Neomycin-Bacitracin Zn-Polymyx] Other (See Comments) and Rash    Other Reaction: irritation Skin irritation   . Quinine Nausea And Vomiting, Rash and Other (See Comments)    Other Reaction: Vomiting, rash, h/a, vision  . Ultram [Tramadol] Palpitations  . Zocor [Simvastatin] Other (See Comments) and Rash    Other Reaction: muscle spasms Muscle pain and spasms  . Bactrim [Sulfamethoxazole-Trimethoprim] Swelling  . Levodopa Other (See Comments)    Reaction: unknown  . Macrodantin [Nitrofurantoin Macrocrystal] Swelling  . Quinine Derivatives Other (See Comments)    Vertigo,nausea vomiting blurred vision headache ears sensitivity    DISCHARGE MEDICATIONS:   Allergies as of 05/07/2019      Reactions   Codeine Nausea And Vomiting, Nausea Only   Enalapril Maleate Other (See Comments)   Other reaction(s): Headache   Nitrofurantoin Swelling, Rash   Other Reaction: swelling of body   Sulfamethoxazole-trimethoprim Swelling   Vicodin [hydrocodone-acetaminophen] Nausea And  Vomiting, Nausea Only   2,4-d Dimethylamine (amisol) Rash, Other (See Comments)   Other Reaction: h/a   Baclofen Other (See Comments), Nausea Only   lightheadness ,drowsiness , muscle weakness , twitching in hands    Neosporin [neomycin-bacitracin Zn-polymyx] Other (See Comments), Rash   Other Reaction: irritation Skin irritation   Quinine Nausea And Vomiting, Rash, Other (See Comments)   Other Reaction: Vomiting, rash, h/a, vision   Ultram [tramadol] Palpitations   Zocor [simvastatin] Other (See Comments), Rash   Other Reaction: muscle spasms Muscle pain and spasms   Bactrim [sulfamethoxazole-trimethoprim] Swelling   Levodopa Other (See Comments)   Reaction: unknown    Macrodantin [nitrofurantoin Macrocrystal] Swelling   Quinine Derivatives Other (See Comments)   Vertigo,nausea vomiting blurred vision headache ears sensitivity      Medication List    STOP taking these medications   aspirin 81 MG EC tablet     TAKE these medications   albuterol 108 (90 Base) MCG/ACT inhaler Commonly known as: VENTOLIN HFA Inhale 2 puffs into the lungs every 6 (six) hours as needed for wheezing or shortness of breath.   albuterol (2.5 MG/3ML) 0.083% nebulizer solution Commonly known as: PROVENTIL Take 3 mLs (2.5 mg total) by nebulization every 6 (six) hours as needed for wheezing or shortness of breath.   amLODipine 10 MG tablet Commonly known as: NORVASC Take 10 mg by mouth.   calcium acetate 667 MG capsule Commonly known as: PHOSLO Take 667 mg by mouth 3 (three) times daily with meals.   carvedilol 12.5 MG tablet Commonly known as: COREG Take 12.5 mg by mouth 2 (two) times daily with a meal.   cetirizine 10 MG tablet Commonly known as: ZYRTEC Take 10 mg by mouth at bedtime.   clopidogrel 75 MG tablet Commonly known as: PLAVIX Take 1 tablet (75 mg total) by mouth daily.   cyclobenzaprine 10 MG tablet Commonly known as: FLEXERIL Take 10 mg by mouth daily as needed for muscle spasms.   Fluticasone-Salmeterol 250-50 MCG/DOSE Aepb Commonly known as: ADVAIR Inhale 1 puff into the lungs 2 (two) times daily as needed (for shortness of breath).   furosemide 80 MG tablet Commonly known as: LASIX Take 1 tablet (80 mg total) by mouth daily.   hydrALAZINE 25 MG tablet Commonly known as: APRESOLINE Take 25 mg by mouth 3 (three) times daily.   HYDROcodone-acetaminophen 7.5-325 MG tablet Commonly known as: NORCO Take 1-2 tablets by mouth daily as needed (pain).   lidocaine-prilocaine cream Commonly known as: EMLA Apply 1 application topically as needed (prior to treatment).   losartan 50 MG tablet Commonly known as: COZAAR Take 50 mg by mouth  daily.   omeprazole 20 MG capsule Commonly known as: PRILOSEC Take 20 mg by mouth 2 (two) times daily before a meal.   oxybutynin 15 MG 24 hr tablet Commonly known as: DITROPAN XL Take 15 mg by mouth daily.   pravastatin 40 MG tablet Commonly known as: PRAVACHOL Take 40 mg by mouth at bedtime.   predniSONE 20 MG tablet Commonly known as: DELTASONE Take 2 tablets (40 mg total) by mouth daily with breakfast for 4 days. Start taking on: May 08, 2019   topiramate 25 MG tablet Commonly known as: TOPAMAX Take 25 mg by mouth as needed (for headaches).        DISCHARGE INSTRUCTIONS:   DIET:  Renal diet DISCHARGE CONDITION:  Stable ACTIVITY:  Activity as tolerated OXYGEN:  Home Oxygen: No.  Oxygen Delivery: room air DISCHARGE LOCATION:  home   If you experience worsening of your admission symptoms, develop shortness of breath, life threatening emergency, suicidal or homicidal thoughts you must seek medical attention immediately by calling 911 or calling your MD immediately  if symptoms less severe.  You Must read complete instructions/literature along with all the possible adverse reactions/side effects for all the Medicines you take and that have been prescribed to you. Take any new Medicines after you have completely understood and accpet all the possible adverse reactions/side effects.   Please note  You were cared for by a hospitalist during your hospital stay. If you have any questions about your discharge medications or the care you received while you were in the hospital after you are discharged, you can call the unit and asked to speak with the hospitalist on call if the hospitalist that took care of you is not available. Once you are discharged, your primary care physician will handle any further medical issues. Please note that NO REFILLS for any discharge medications will be authorized once you are discharged, as it is imperative that you return to your primary care  physician (or establish a relationship with a primary care physician if you do not have one) for your aftercare needs so that they can reassess your need for medications and monitor your lab values.    On the day of Discharge:  VITAL SIGNS:  Blood pressure (!) 160/55, pulse 76, temperature 99 F (37.2 C), temperature source Oral, resp. rate 19, height 4\' 10"  (1.473 m), weight 58.2 kg, SpO2 99 %. PHYSICAL EXAMINATION:  GENERAL:  73 y.o.-year-old patient lying in the bed with no acute distress.  EYES: Pupils equal, round, reactive to light and accommodation. No scleral icterus. Extraocular muscles intact.  HEENT: Head atraumatic, normocephalic. Oropharynx and nasopharynx clear.  NECK:  Supple, no jugular venous distention. No thyroid enlargement, no tenderness.  LUNGS: Normal breath sounds bilaterally, no wheezing, rales,rhonchi or crepitation. No use of accessory muscles of respiration.  CARDIOVASCULAR: S1, S2 normal. No murmurs, rubs, or gallops.  ABDOMEN: Soft, non-tender, non-distended. Bowel sounds present. No organomegaly or mass.  EXTREMITIES: No pedal edema, cyanosis, or clubbing.  NEUROLOGIC: Cranial nerves II through XII are intact. Muscle strength 5/5 in all extremities. Sensation intact. Gait not checked.  PSYCHIATRIC: The patient is alert and oriented x 3.  SKIN: No obvious rash, lesion, or ulcer.  DATA REVIEW:   CBC Recent Labs  Lab 05/06/19 0349  WBC 8.6  HGB 8.6*  HCT 27.3*  PLT 139*    Chemistries  Recent Labs  Lab 05/07/19 0244  NA 140  K 4.5  CL 101  CO2 27  GLUCOSE 166*  BUN 25*  CREATININE 4.47*  CALCIUM 9.2  MG 2.2     Microbiology Results  Results for orders placed or performed during the hospital encounter of 05/04/19  SARS Coronavirus 2 (CEPHEID - Performed in [place] hospital lab), Hosp Order     Status: None   Collection Time: 05/04/19 11:43 PM   Specimen: Nasopharyngeal Swab  Result Value Ref Range Status   SARS Coronavirus 2  NEGATIVE NEGATIVE Final    Comment: (NOTE) If result is NEGATIVE SARS-CoV-2 target nucleic acids are NOT DETECTED. The SARS-CoV-2 RNA is generally detectable in upper and lower  respiratory specimens during the acute phase of infection. The lowest  concentration of SARS-CoV-2 viral copies this assay can detect is 250  copies / mL. A negative result does not preclude SARS-CoV-2 infection  and should not be  used as the sole basis for treatment or other  patient management decisions.  A negative result may occur with  improper specimen collection / handling, submission of specimen other  than nasopharyngeal swab, presence of viral mutation(s) within the  areas targeted by this assay, and inadequate number of viral copies  (<250 copies / mL). A negative result must be combined with clinical  observations, patient history, and epidemiological information. If result is POSITIVE SARS-CoV-2 target nucleic acids are DETECTED. The SARS-CoV-2 RNA is generally detectable in upper and lower  respiratory specimens dur ing the acute phase of infection.  Positive  results are indicative of active infection with SARS-CoV-2.  Clinical  correlation with patient history and other diagnostic information is  necessary to determine patient infection status.  Positive results do  not rule out bacterial infection or co-infection with other viruses. If result is PRESUMPTIVE POSTIVE SARS-CoV-2 nucleic acids MAY BE PRESENT.   A presumptive positive result was obtained on the submitted specimen  and confirmed on repeat testing.  While 2019 novel coronavirus  (SARS-CoV-2) nucleic acids may be present in the submitted sample  additional confirmatory testing may be necessary for epidemiological  and / or clinical management purposes  to differentiate between  SARS-CoV-2 and other Sarbecovirus currently known to infect humans.  If clinically indicated additional testing with an alternate test  methodology [phone])  is advised. The SARS-CoV-2 RNA is generally  detectable in upper and lower respiratory sp ecimens during the acute  phase of infection. The expected result is Negative. Fact Sheet for Patients:  StrictlyIdeas.no Fact Sheet for Healthcare Providers: BankingDealers.co.za This test is not yet approved or cleared by the [place] FDA and has been authorized for detection and/or diagnosis of SARS-CoV-2 by FDA under an Emergency Use Authorization (EUA).  This EUA will remain in effect (meaning this test can be used) for the duration of the COVID-19 declaration under Section 564(b)(1) of the Act, 21 U.S.C. section 360bbb-3(b)(1), unless the authorization is terminated or revoked sooner. Performed at [facility], [address] [place], [place] [address]   MRSA PCR Screening     Status: None   Collection Time: 05/06/19  8:55 AM   Specimen: Nasopharyngeal  Result Value Ref Range Status   MRSA by PCR NEGATIVE NEGATIVE Final    Comment:        The GeneXpert MRSA Assay (FDA approved for NASAL specimens only), is one component of a comprehensive MRSA colonization surveillance program. It is not intended to diagnose MRSA infection nor to guide or monitor treatment for MRSA infections. Performed at [facility], [address], [place], [place] [address]     RADIOLOGY:  No results found.   Management plans discussed with the patient, family and they are in agreement.  CODE STATUS: Full Code   TOTAL TIME TAKING CARE OF THIS PATIENT: 38 minutes.    Jazen Spraggins M.D on 05/07/2019 at 10:04 AM  Between 7am to 6pm - Pager - [phone]  After 6pm go to [email or web address] - Proofreader  [facility]  Office  [phone]  CC: Primary care physician; [name], MD   Note: This dictation was prepared with Dragon dictation along with smaller phrase technology. Any  transcriptional errors that result from this process are unintentional.

## 2019-05-07 NOTE — TOC Initial Note (Signed)
Transition of Care [facility]) - Initial/Assessment Note    Patient Details  Name: Phyllis [name] MRN: [id number] Date of Birth: 02-20-1946  Transition of Care [facility]) CM/SW Contact:    Beverly Sessions, RN Phone Number: 05/07/2019, 12:07 PM  Clinical Narrative:                  Patient admitted from home with atypical chest pain.  Patient lives at home with a friend.  However patient states "she isnt much help to me"  Patient to discharge home today.  Elvera Bicker dialysis liaison notified of discharge.   PCP hande.  Pharmacy [facility]. Patient denies issues obtaining medications.   Patient states that for the most part she is independent and drives her self.  Patient states that she does have members in her "stain glass group" that can help with transportation if needed.   Patient states that she has a RW and cane in the home if needed.   Patient does state that at time she had difficulity preparing her meals.  She inquires if there is anyone available that could help with light house work and or preparing meals.  RNCM provided her list of PCS services and meals on wheels Expected Discharge Plan: Home/Self Care     Patient Goals and CMS Choice        Expected Discharge Plan and Services Expected Discharge Plan: Home/Self Care   Discharge Planning Services: Homebound not met per provider   Living arrangements for the past 2 months: Single Family Home Expected Discharge Date: 05/07/19                                    Prior Living Arrangements/Services Living arrangements for the past 2 months: Single Family Home Lives with:: Roommate Patient language and need for interpreter reviewed:: Yes            Current home services: DME Criminal Activity/Legal Involvement Pertinent to Current Situation/Hospitalization: No - Comment as needed  Activities of Daily Living Home Assistive Devices/Equipment: Environmental consultant (specify type) ADL Screening (condition at time  of admission) Patient's cognitive ability adequate to safely complete daily activities?: Yes Is the patient deaf or have difficulty hearing?: No Does the patient have difficulty seeing, even when wearing glasses/contacts?: Yes Does the patient have difficulty concentrating, remembering, or making decisions?: No Patient able to express need for assistance with ADLs?: Yes Does the patient have difficulty dressing or bathing?: Yes Independently performs ADLs?: No Communication: Independent Dressing (OT): Independent Grooming: Independent Feeding: Independent Bathing: Needs assistance Toileting: Needs assistance In/Out Bed: Needs assistance Walks in Home: Independent Does the patient have difficulty walking or climbing stairs?: Yes Weakness of Legs: Both Weakness of Arms/Hands: None  Permission Sought/Granted                  Emotional Assessment Appearance:: Appears stated age Attitude/Demeanor/Rapport: Gracious Affect (typically observed): Accepting Orientation: : Oriented to Self, Oriented to Place, Oriented to  Time, Oriented to Situation   Psych Involvement: No (comment)  Admission diagnosis:  SOB (shortness of breath) [R06.02] Chest pain, unspecified type [R07.9] Patient Active Problem List   Diagnosis Date Noted  . Hyperkalemia 11/29/2018  . Chronic diastolic heart failure ([place]) 11/02/2018  . HTN (hypertension) 11/02/2018  . Uncontrolled hypertension 10/21/2018  . Pressure injury of skin 03/16/2018  . CVA (cerebral vascular accident) ([place]) 01/16/2018  . Aortic atherosclerosis ([place]) 12/11/2017  .  Chest pain 09/18/2017  . Hyperlipidemia 08/06/2015  . Complication from renal dialysis device 04/12/2015  . SOB (shortness of breath) 02/01/2015  . End stage renal disease on dialysis ([place]) 02/01/2015  . Type 2 diabetes mellitus with other specified complication ([place]) 18/40/3754  . Asthma 02/01/2015  . Acute on chronic diastolic CHF (congestive heart failure) ([place])  02/01/2015   PCP:  [name], MD Pharmacy:   [facility], [place] [address] [place] [address] Phone: [phone] Fax: [place], [place] [address] [place] [address] Phone: [phone] Fax: [phone]  [id number] [place] [id number]: [phone]: [phone]    Social Determinants of Health (SDOH) Interventions    Readmission Risk Interventions Readmission Risk Prevention Plan 05/07/2019  Transportation Screening Complete  Medication Review ([place] Complete  PCP or Specialist appointment within 3-5 days of discharge Complete  Palliative Care Screening Not [place]leasant Plain[place] Applicable  Some recent data might be hidden

## 2019-05-08 LAB — GLUCOSE, CAPILLARY
Glucose-Capillary: 110 mg/dL — ABNORMAL HIGH (ref 70–99)
Glucose-Capillary: 137 mg/dL — ABNORMAL HIGH (ref 70–99)

## 2019-05-08 NOTE — Progress Notes (Signed)
HD Tx completed tolerated well, UF goal Met    05/08/19 1615  Vital Signs  Pulse Rate 73  Pulse Rate Source Monitor  Resp (!) 23  BP (!) 180/47  BP Location Right Arm  BP Method Automatic  Patient Position (if appropriate) Sitting  Oxygen Therapy  SpO2 97 %  O2 Device Room Air  Pulse Oximetry Type Continuous  During Hemodialysis Assessment  HD Safety Checks Performed Yes  KECN 41.5 KECN  Dialysis Fluid Bolus Normal Saline  Bolus Amount (mL) 250 mL  Intra-Hemodialysis Comments Tx completed;Tolerated well

## 2019-05-08 NOTE — Progress Notes (Signed)
Discharge instructions explained to pt/ verbalized an understanding/ iv and tele removed/ RX given to pt/ transported off unit via wheelchair.  

## 2019-05-08 NOTE — Progress Notes (Signed)
Pre HD Tx   05/08/19 1339  Hand-Off documentation  Report given to (Full Name) Beatris Ship, RN   Report received from (Full Name) Georgina Quint, RN   Vital Signs  Temp 98.4 F (36.9 C)  Temp Source Oral  Pulse Rate 78  Pulse Rate Source Monitor  Resp 18  BP 140/88  BP Location Right Arm  BP Method Automatic  Patient Position (if appropriate) Sitting  Oxygen Therapy  SpO2 98 %  O2 Device Room Air  Pulse Oximetry Type Continuous  Pain Assessment  Pain Scale 0-10  Pain Score 0  Dialysis Weight  Weight 58.6 kg  Type of Weight Pre-Dialysis  Time-Out for Hemodialysis  What Procedure? HD  Pt Identifiers(min of two) First/Last Name;MRN/Account#  Correct Site? Yes  Correct Side? Yes  Correct Procedure? Yes  Consents Verified? Yes  Rad Studies Available? Yes  Safety Precautions Reviewed? Yes  Engineer, civil (consulting) Number 5  Station Number 3  UF/Alarm Test Passed  Conductivity: Meter 14  Conductivity: Machine  13.9  pH 7.2  Reverse Osmosis main  Normal Saline Lot Number [id number]  Dialyzer Lot Number [id number]  Disposable Set Lot Number [id number]  Machine Temperature 98.6 F (37 C)  Musician and Audible Yes  Blood Lines Intact and Secured Yes  Pre Treatment Patient Checks  Vascular access used during treatment Fistula  Patient is receiving dialysis in a chair Yes  Hepatitis B Surface Antigen Results Negative  Date Hepatitis B Surface Antigen Drawn 11/05/18  Hepatitis B Surface Antibody  (>10)  Date Hepatitis B Surface Antibody Drawn 11/05/18  Hemodialysis Consent Verified Yes  Hemodialysis Standing Orders Initiated Yes  ECG (Telemetry) Monitor On Yes  Prime Ordered Normal Saline  Length of  DialysisTreatment -hour(s) 2.5 Hour(s)  Dialysis Treatment Comments Na 140  Dialyzer Elisio 17H NR  Dialysate 2K;2.5 Ca  Dialysis Anticoagulant None  Dialysate Flow Ordered 600  Blood Flow Rate Ordered 300 mL/min  Ultrafiltration Goal 1 Liters  Dialysis Blood  Pressure Support Ordered Normal Saline  Education / Care Plan  Dialysis Education Provided Yes  Documented Education in Care Plan Yes  Fistula / Graft Left Forearm Arteriovenous fistula  No Placement Date or Time found.   Placed prior to admission: Yes  Orientation: Left  Access Location: Forearm  Access Type: Arteriovenous fistula  Site Condition No complications  Fistula / Graft Assessment Present;Thrill;Bruit  Status Patent  Drainage Description None

## 2019-05-08 NOTE — TOC Transition Note (Signed)
Transition of Care [facility]) - CM/SW Discharge Note   Patient Details  Name: Phyllis [name] MRN: [id number] Date of Birth: Dec 27, 1945  [facility] CM/SW Contact:  Beverly Sessions, RN Phone Number: 05/08/2019, 12:10 PM   Clinical Narrative:     Corene Cornea with [facility] notified of discharge  Final next level of care: [place] Services Barriers to Discharge: Other (comment)(pt c/o continue chest pain)   Patient Goals and CMS Choice     Choice offered to / list presented to : Patient  Discharge Placement                       Discharge Plan and Services   Discharge Planning Services: Homebound not met per provider                      HH Arranged: RN [facility] Agency: [place] ([place]) Date [place]: 05/07/19 Time [place]: [place] Representative spoke with at [place]: [place] ([place]) Interventions     Readmission Risk Interventions Readmission Risk Prevention Plan 05/07/2019  Transportation Screening Complete  Medication Review Press photographer) Complete  PCP or Specialist appointment within 3-5 days of discharge Complete  HRI or [place] Not Applicable  Some recent data might be hidden

## 2019-05-08 NOTE — Progress Notes (Signed)
HD Tx started w/o complication    35/46/56 1340  Vital Signs  Pulse Rate 78  Resp 19  BP (!) 159/55  BP Location Right Arm  BP Method Automatic  Patient Position (if appropriate) Sitting  Oxygen Therapy  SpO2 98 %  O2 Device Room Air  Pulse Oximetry Type Continuous  During Hemodialysis Assessment  Blood Flow Rate (mL/min) 300 mL/min  Arterial Pressure (mmHg) -250 mmHg  Venous Pressure (mmHg) 170 mmHg  Transmembrane Pressure (mmHg) 60 mmHg  Ultrafiltration Rate (mL/min) 600 mL/min  Dialysate Flow Rate (mL/min) 600 ml/min  Conductivity: Machine  15.3  HD Safety Checks Performed Yes  Dialysis Fluid Bolus Normal Saline  Bolus Amount (mL) 250 mL  Intra-Hemodialysis Comments Tx initiated  Fistula / Graft Left Forearm Arteriovenous fistula  No Placement Date or Time found.   Placed prior to admission: Yes  Orientation: Left  Access Location: Forearm  Access Type: Arteriovenous fistula  Status Accessed  Needle Size 16g  Drainage Description None

## 2019-05-08 NOTE — Progress Notes (Signed)
[facility]  ROUNDING NOTE   Subjective:   Seen and examined on hemodialysis treatment.     HEMODIALYSIS FLOWSHEET:  Blood Flow Rate (mL/min): 300 mL/min Arterial Pressure (mmHg): -210 mmHg Venous Pressure (mmHg): 170 mmHg Transmembrane Pressure (mmHg): 60 mmHg Ultrafiltration Rate (mL/min): 600 mL/min Dialysate Flow Rate (mL/min): 600 ml/min Conductivity: Machine : 13.4 Conductivity: Machine : 13.4 Dialysis Fluid Bolus: Normal Saline Bolus Amount (mL): 250 mL    Objective:  Vital signs in last 24 hours:  Temp:  [98.4 F (36.9 C)-98.7 F (37.1 C)] 98.4 F (36.9 C) (06/18 1339) Pulse Rate:  [66-80] 67 (06/18 1515) Resp:  [16-21] 17 (06/18 1515) BP: (135-177)/(43-88) 169/48 (06/18 1515) SpO2:  [93 %-98 %] 98 % (06/18 1340) Weight:  [58.6 kg] 58.6 kg (06/18 1339)  Weight change: -0.7 kg Filed Weights   05/07/19 0537 05/08/19 0404 05/08/19 1339  Weight: 58.2 kg 58.6 kg 58.6 kg    Intake/Output: I/O last 3 completed shifts: In: 48 [P.O.:960] Out: 750 [Urine:750]   Intake/Output this shift:  Total I/O In: 480 [P.O.:480] Out: 150 [Urine:150]  Physical Exam: General: NAD,   Head: Normocephalic, atraumatic. Moist oral mucosal membranes  Eyes: Anicteric, PERRL  Neck: Supple, trachea midline  Chest Tender to palpation  Lungs:  Clear to auscultation  Heart: Regular rate and rhythm  Abdomen:  Soft, nontender,   Extremities: no peripheral edema.  Neurologic: Nonfocal, moving all four extremities  Skin: No lesions  Access: Left AVF    Basic Metabolic Panel: Recent Labs  Lab 05/04/19 1450 05/04/19 2201 05/05/19 0434 05/06/19 0349 05/07/19 0244  NA 141 142  --  140 140  K 3.7 3.6  --  3.9 4.5  CL 99 99  --  100 101  CO2 30 28  --  27 27  GLUCOSE 107* 152*  --  105* 166*  BUN 26* 30*  --  43* 25*  CREATININE 4.85* 5.35* 5.78* 7.19* 4.47*  CALCIUM 9.0 9.1  --  8.7* 9.2  MG  --   --   --  2.3 2.2    Liver Function Tests: No results for  input(s): AST, ALT, ALKPHOS, BILITOT, PROT, ALBUMIN in the last 168 hours. No results for input(s): LIPASE, AMYLASE in the last 168 hours. No results for input(s): AMMONIA in the last 168 hours.  CBC: Recent Labs  Lab 05/04/19 1450 05/04/19 2201 05/05/19 0434 05/06/19 0349  WBC 6.0 6.5 5.8 8.6  NEUTROABS 4.2  --   --   --   HGB 9.8* 9.5* 8.6* 8.6*  HCT 29.9* 29.3* 26.5* 27.3*  MCV 89.5 91.3 92.3 92.2  PLT 182 164 117* 139*    Cardiac Enzymes: Recent Labs  Lab 05/04/19 1450 05/04/19 2201 05/05/19 0434 05/05/19 1026  TROPONINI 0.03* 0.03* 0.05* 0.04*    BNP: Invalid input(s): POCBNP  CBG: Recent Labs  Lab 05/07/19 1121 05/07/19 1629 05/07/19 2050 05/08/19 0759 05/08/19 1135  GLUCAP 253* 151* 178* 110* 137*    Microbiology: Results for orders placed or performed during the hospital encounter of 05/04/19  SARS Coronavirus 2 (CEPHEID - Performed in [place] hospital lab), Hosp Order     Status: None   Collection Time: 05/04/19 11:43 PM   Specimen: Nasopharyngeal Swab  Result Value Ref Range Status   SARS Coronavirus 2 NEGATIVE NEGATIVE Final    Comment: (NOTE) If result is NEGATIVE SARS-CoV-2 target nucleic acids are NOT DETECTED. The SARS-CoV-2 RNA is generally detectable in upper and lower  respiratory specimens  during the acute phase of infection. The lowest  concentration of SARS-CoV-2 viral copies this assay can detect is 250  copies / mL. A negative result does not preclude SARS-CoV-2 infection  and should not be used as the sole basis for treatment or other  patient management decisions.  A negative result may occur with  improper specimen collection / handling, submission of specimen other  than nasopharyngeal swab, presence of viral mutation(s) within the  areas targeted by this assay, and inadequate number of viral copies  (<250 copies / mL). A negative result must be combined with clinical  observations, patient history, and epidemiological  information. If result is POSITIVE SARS-CoV-2 target nucleic acids are DETECTED. The SARS-CoV-2 RNA is generally detectable in upper and lower  respiratory specimens dur ing the acute phase of infection.  Positive  results are indicative of active infection with SARS-CoV-2.  Clinical  correlation with patient history and other diagnostic information is  necessary to determine patient infection status.  Positive results do  not rule out bacterial infection or co-infection with other viruses. If result is PRESUMPTIVE POSTIVE SARS-CoV-2 nucleic acids MAY BE PRESENT.   A presumptive positive result was obtained on the submitted specimen  and confirmed on repeat testing.  While 2019 novel coronavirus  (SARS-CoV-2) nucleic acids may be present in the submitted sample  additional confirmatory testing may be necessary for epidemiological  and / or clinical management purposes  to differentiate between  SARS-CoV-2 and other Sarbecovirus currently known to infect humans.  If clinically indicated additional testing with an alternate test  methodology [id number]) is advised. The SARS-CoV-2 RNA is generally  detectable in upper and lower respiratory sp ecimens during the acute  phase of infection. The expected result is Negative. Fact Sheet for Patients:  StrictlyIdeas.no Fact Sheet for Healthcare Providers: BankingDealers.co.za This test is not yet approved or cleared by the [place] FDA and has been authorized for detection and/or diagnosis of SARS-CoV-2 by FDA under an Emergency Use Authorization (EUA).  This EUA will remain in effect (meaning this test can be used) for the duration of the COVID-19 declaration under Section 564(b)(1) of the Act, 21 U.S.C. section 360bbb-3(b)(1), unless the authorization is terminated or revoked sooner. Performed at [facility], [address] [place], [place] [address]   MRSA PCR Screening      Status: None   Collection Time: 05/06/19  8:55 AM   Specimen: Nasopharyngeal  Result Value Ref Range Status   MRSA by PCR NEGATIVE NEGATIVE Final    Comment:        The GeneXpert MRSA Assay (FDA approved for NASAL specimens only), is one component of a comprehensive MRSA colonization surveillance program. It is not intended to diagnose MRSA infection nor to guide or monitor treatment for MRSA infections. Performed at [facility], [address] [place], [place] [address]     Coagulation Studies: No results for input(s): LABPROT, INR in the last 72 hours.  Urinalysis: No results for input(s): COLORURINE, LABSPEC, PHURINE, GLUCOSEU, HGBUR, BILIRUBINUR, KETONESUR, PROTEINUR, UROBILINOGEN, NITRITE, LEUKOCYTESUR in the last 72 hours.  Invalid input(s): APPERANCEUR    Imaging: Dg Chest 1 View  Result Date: 05/07/2019 CLINICAL DATA:  Shortness of breath. EXAM: CHEST  1 VIEW COMPARISON:  05/06/2019. FINDINGS: Mediastinum hilar structures normal. Cardiomegaly. Bilateral interstitial prominence, improved from prior exam. Findings suggest improving CHF and/or pneumonitis. No pleural effusion or pneumothorax. IMPRESSION: Cardiomegaly with mild pulmonary interstitial prominence. Improved interstitial prominence from prior exam. Findings most  consistent with improving CHF. Electronically Signed   By: Wilson   On: 05/07/2019 12:16     Medications:    . amLODipine  10 mg Oral Daily  . calcium acetate  1,334 mg Oral TID WC  . carvedilol  12.5 mg Oral BID WC  . clopidogrel  75 mg Oral Daily  . furosemide  80 mg Oral Daily  . heparin  5,000 Units Subcutaneous Q8H  . hydrALAZINE  25 mg Oral TID  . insulin aspart  0-5 Units Subcutaneous QHS  . insulin aspart  0-9 Units Subcutaneous TID WC  . losartan  50 mg Oral Daily  . mometasone-formoterol  2 puff Inhalation BID  .  morphine injection  2 mg Intravenous Once  . oxybutynin  15 mg Oral Daily  . pantoprazole  20 mg  Oral BID AC  . pravastatin  40 mg Oral QHS  . predniSONE  40 mg Oral Q breakfast  . sodium chloride flush  3 mL Intravenous Q12H   acetaminophen **OR** acetaminophen, HYDROcodone-acetaminophen, labetalol, ondansetron **OR** ondansetron (ZOFRAN) IV  Assessment/ Plan:  [name] is a 73 y.o. white female with end stage renal disease on hemodialysis, hypertension, diabetes mellitus type II, overactive bladder, gout, COPD, congestive heart failure, coronary artery disease  [facility] [address] 58.5kg Left AVF  1. End Stage Renal Disease: on hemodialysis.  Seen and examined on hemodialysis treatment.   2. Hypertension:  Home regimen of amlodipine, carvedilol, furosemide, hydralazine, losartan.   3. Anemia with chronic kidney disease:   - EPO with HD treatment  4. Secondary Hyperparathyroidism: labs from 04/29/19 PTH 391, phosphorus 4.9, calcium 9.5 - Calcium acetate with meals.   5. Costochondritis:  Appreciate cardiology input.    LOS: 2 Daleysa Kristiansen 6/18/20204:24 PM

## 2019-05-08 NOTE — Progress Notes (Signed)
Pre HD Assessment    05/08/19 1330  Neurological  Level of Consciousness Alert  Orientation Level Oriented X4  Respiratory  Respiratory Pattern Regular  Chest Assessment Chest expansion symmetrical  Bilateral Breath Sounds Diminished  Cough None  Cardiac  Pulse Regular  Heart Sounds S1, S2  ECG Monitor Yes  Cardiac Rhythm NSR  Vascular  R Radial Pulse +2  L Radial Pulse +2  Psychosocial  Psychosocial (WDL) WDL

## 2019-05-08 NOTE — Discharge Summary (Signed)
[facility] NAME: Phyllis [name]    MR#:  [id number]  DATE OF BIRTH:  03-15-1946  DATE OF ADMISSION:  05/04/2019   ADMITTING PHYSICIAN: Lance Coon, MD  DATE OF DISCHARGE: 05/08/2019  PRIMARY CARE PHYSICIAN: [name], MD   ADMISSION DIAGNOSIS:  SOB (shortness of breath) [R06.02] Chest pain, unspecified type [R07.9] DISCHARGE DIAGNOSIS:  Principal Problem:   Chest pain Active Problems:   SOB (shortness of breath)   End stage renal disease on dialysis ([place])   Type 2 diabetes mellitus with other specified complication (HCC)   Hyperlipidemia   Uncontrolled hypertension   Chronic diastolic heart failure ([place])  SECONDARY DIAGNOSIS:   Past Medical History:  Diagnosis Date  . Allergy   . Anemia of chronic disease   . Anxiety   . Aortic atherosclerosis ([place])   . Asthma   . CHF (congestive heart failure) ([place])   . Chronic back pain   . COPD (chronic obstructive pulmonary disease) ([place])   . Diabetes mellitus with complication ([place])   . ESRD on hemodialysis ([place])    a. Tues/Sat; b. 2/2 small kidneys  . Essential hypertension   . Fistula    lower left arm  . GERD (gastroesophageal reflux disease)   . Gout   . History of echocardiogram    a. TTE 01/2014: nl LV sys fxn, no valvular abnormalities; b. TTE 11/16: nl EF, mild LVH  . History of exercise stress test    a. 01/2014: no evidence of ischemia; b. Lexiscan 08/2015: no sig ischemia, severe GI uptake artifact, low risk; c. CPET @ [facility] 09/2016: exercised 3 min 12 sec on bike without incline, 2.28 METs, VO2 of 8.1, 48% of predicted, indicating mod to sev functional impairment, evidence of blunted HR, stroke volume, and BP augmentation as well as ventilation-perfusion mismatch with exercise  . HLD (hyperlipidemia)   . 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 Renal insufficiency 09/24/2017   Dialysis patient.  . Sleep apnea    HOSPITAL  COURSE:  Chief complaint; shortness of breath   History of presenting complaint; LindaCrutchfieldis a73 y.o.femalewho presented to the ED with a complaint of shortness of breath. She was in the ED initially early in the afternoon with this complaint. Work-up was largely unrevealing and she was discharged home. She states that when she got home she became much more short of breath again and called EMS. EMS said her oxygen saturations were in the 80s when they arrived at her home. Here in the ED her evaluation is much improved, but she does complain of chest pain. She states that she has chronic costochondritis, and that this pain feels the same as her chronic pain. However, she states she is more short of breath. Work-up is again largely unrevealing. However, she is significantly hypertensive. The chest x-ray is clear, troponin is barely elevated which seems to be her baseline based on chart review. Hospitalist called for admission and further evaluation   Hospital course; 1. Atypical chest pain and shortness of breath Patient with chronically mildly elevated troponin which is stable with recent troponin of 0.04. No EKG changes.Patient seen by cardiologist. Chest pain said to be secondary to costochondritis.  Patient with known history of costochondritis.Nephrologist okay with initiation of prednisone. To avoid NSAIDs since patient still making urine.  Patient started on prednisone 40 mg p.o. daily during this admission and has responded very well with significant improvement .  Prescription for prednisone for a few more days given. 2D echocardiogramdone with ejection fraction of 60 to 65%. Patient said to be slightly anemic with hemoglobin of 8.6. Cardiologist Dr Fletcher Anon noted patient has been on dual antiplatelet therapy with aspirin and Plavix since March 2019 due to concern for possible TIA/CVA in the past. Cardiologist recommended discontinuing 1 antiplatelet therapy since  no indication for dual therapy at this time. Low-dose aspirin discontinued to decrease risk of bleeding and worsening of anemia. Appointment made to follow-up with her cardiologist Dr. Rockey Situ in 2 weeks as recommended  2. End-stage renal disease on hemodialysis Nephrologist already consulted for inpatient hemodialysis.  Patient to continue with outpatient hemodialysis on discharge  3. Diabetes mellitus type 2 Glycosylated hemoglobin levelof 4.5 suggestive of well-controlled diabetes mellitus  4. Hypertension Continue home regimen and follow-up with primary care physician for monitoring  5.History of chronic diastolic CHF Noted evidence of pulmonary edema on chest x-ray 3 nights ago.  Likely has acute on chronic diastolic CHF.  Patient had hemodialysis with significant improvement.  Already weaned off oxygen with oxygen saturation of 94% on room air this morning.  Discharge was canceled yesterday since patient later complained of some shortness of breath.  Repeat stat chest x-ray done yesterday showed improvement in CHF.  Plans for discharge later today after hemodialysis.  6. Hyperlipidemia Continue statins  DISCHARGE CONDITIONS:  Stable CONSULTS OBTAINED:  Treatment Team:  Lavonia Dana, MD DRUG ALLERGIES:   Allergies  Allergen Reactions  . Codeine Nausea And Vomiting and Nausea Only  . Enalapril Maleate Other (See Comments)    Other reaction(s): Headache  . Nitrofurantoin Swelling and Rash    Other Reaction: swelling of body  . Sulfamethoxazole-Trimethoprim Swelling  . Vicodin [Hydrocodone-Acetaminophen] Nausea And Vomiting and Nausea Only  . 2,4-D Dimethylamine (Amisol) Rash and Other (See Comments)    Other Reaction: h/a  . Baclofen Other (See Comments) and Nausea Only    lightheadness ,drowsiness , muscle weakness , twitching in hands   . Neosporin [Neomycin-Bacitracin Zn-Polymyx] Other (See Comments) and Rash    Other Reaction: irritation Skin irritation    . Quinine Nausea And Vomiting, Rash and Other (See Comments)    Other Reaction: Vomiting, rash, h/a, vision  . Ultram [Tramadol] Palpitations  . Zocor [Simvastatin] Other (See Comments) and Rash    Other Reaction: muscle spasms Muscle pain and spasms  . Bactrim [Sulfamethoxazole-Trimethoprim] Swelling  . Levodopa Other (See Comments)    Reaction: unknown  . Macrodantin [Nitrofurantoin Macrocrystal] Swelling  . Quinine Derivatives Other (See Comments)    Vertigo,nausea vomiting blurred vision headache ears sensitivity    DISCHARGE MEDICATIONS:   Allergies as of 05/08/2019      Reactions   Codeine Nausea And Vomiting, Nausea Only   Enalapril Maleate Other (See Comments)   Other reaction(s): Headache   Nitrofurantoin Swelling, Rash   Other Reaction: swelling of body   Sulfamethoxazole-trimethoprim Swelling   Vicodin [hydrocodone-acetaminophen] Nausea And Vomiting, Nausea Only   2,4-d Dimethylamine (amisol) Rash, Other (See Comments)   Other Reaction: h/a   Baclofen Other (See Comments), Nausea Only   lightheadness ,drowsiness , muscle weakness , twitching in hands    Neosporin [neomycin-bacitracin Zn-polymyx] Other (See Comments), Rash   Other Reaction: irritation Skin irritation   Quinine Nausea And Vomiting, Rash, Other (See Comments)   Other Reaction: Vomiting, rash, h/a, vision   Ultram [tramadol] Palpitations   Zocor [simvastatin] Other (See Comments), Rash   Other Reaction: muscle spasms Muscle pain and spasms  Bactrim [sulfamethoxazole-trimethoprim] Swelling   Levodopa Other (See Comments)   Reaction: unknown   Macrodantin [nitrofurantoin Macrocrystal] Swelling   Quinine Derivatives Other (See Comments)   Vertigo,nausea vomiting blurred vision headache ears sensitivity      Medication List    STOP taking these medications   aspirin 81 MG EC tablet     TAKE these medications   albuterol 108 (90 Base) MCG/ACT inhaler Commonly known as: VENTOLIN HFA Inhale  2 puffs into the lungs every 6 (six) hours as needed for wheezing or shortness of breath.   albuterol (2.5 MG/3ML) 0.083% nebulizer solution Commonly known as: PROVENTIL Take 3 mLs (2.5 mg total) by nebulization every 6 (six) hours as needed for wheezing or shortness of breath.   amLODipine 10 MG tablet Commonly known as: NORVASC Take 10 mg by mouth. Notes to patient: Next dose 6/18 am   calcium acetate 667 MG capsule Commonly known as: PHOSLO Take 667 mg by mouth 3 (three) times daily with meals. Notes to patient: Next dose 6/17 dinner   carvedilol 12.5 MG tablet Commonly known as: COREG Take 12.5 mg by mouth 2 (two) times daily with a meal. Notes to patient: Next dose 6/17 dinnertime   cetirizine 10 MG tablet Commonly known as: ZYRTEC Take 10 mg by mouth at bedtime. Notes to patient: Next dose 6/17 pm   clopidogrel 75 MG tablet Commonly known as: PLAVIX Take 1 tablet (75 mg total) by mouth daily. Notes to patient: Next dose 6/18 am   cyclobenzaprine 10 MG tablet Commonly known as: FLEXERIL Take 10 mg by mouth daily as needed for muscle spasms.   Fluticasone-Salmeterol 250-50 MCG/DOSE Aepb Commonly known as: ADVAIR Inhale 1 puff into the lungs 2 (two) times daily as needed (for shortness of breath).   furosemide 80 MG tablet Commonly known as: LASIX Take 1 tablet (80 mg total) by mouth daily. Notes to patient: Next dose 6/18 am   hydrALAZINE 25 MG tablet Commonly known as: APRESOLINE Take 25 mg by mouth 3 (three) times daily. Notes to patient: Next dose 6/17 evening   HYDROcodone-acetaminophen 7.5-325 MG tablet Commonly known as: NORCO Take 1-2 tablets by mouth daily as needed (pain). Notes to patient: Can take next dose 6/18   lidocaine-prilocaine cream Commonly known as: EMLA Apply 1 application topically as needed (prior to treatment).   losartan 50 MG tablet Commonly known as: COZAAR Take 50 mg by mouth daily. Notes to patient: Next dose 6/18 am    omeprazole 20 MG capsule Commonly known as: PRILOSEC Take 20 mg by mouth 2 (two) times daily before a meal. Notes to patient: Next dose 6/17 dinner   oxybutynin 15 MG 24 hr tablet Commonly known as: DITROPAN XL Take 15 mg by mouth daily. Notes to patient: Next dose 6/18 am   pravastatin 40 MG tablet Commonly known as: PRAVACHOL Take 40 mg by mouth at bedtime. Notes to patient: Next dose 6/17 pm   predniSONE 20 MG tablet Commonly known as: DELTASONE Take 2 tablets (40 mg total) by mouth daily with breakfast for 4 days. Notes to patient: Next dose 6/18 am   topiramate 25 MG tablet Commonly known as: TOPAMAX Take 25 mg by mouth as needed (for headaches).        DISCHARGE INSTRUCTIONS:   DIET:  Renal diet and with 1800 ADA diet restriction DISCHARGE CONDITION:  Stable ACTIVITY:  Activity as tolerated OXYGEN:  Home Oxygen: No.  Oxygen Delivery: room air DISCHARGE LOCATION:  home   If  you experience worsening of your admission symptoms, develop shortness of breath, life threatening emergency, suicidal or homicidal thoughts you must seek medical attention immediately by calling 911 or calling your MD immediately  if symptoms less severe.  You Must read complete instructions/literature along with all the possible adverse reactions/side effects for all the Medicines you take and that have been prescribed to you. Take any new Medicines after you have completely understood and accpet all the possible adverse reactions/side effects.   Please note  You were cared for by a hospitalist during your hospital stay. If you have any questions about your discharge medications or the care you received while you were in the hospital after you are discharged, you can call the unit and asked to speak with the hospitalist on call if the hospitalist that took care of you is not available. Once you are discharged, your primary care physician will handle any further medical issues. Please note  that NO REFILLS for any discharge medications will be authorized once you are discharged, as it is imperative that you return to your primary care physician (or establish a relationship with a primary care physician if you do not have one) for your aftercare needs so that they can reassess your need for medications and monitor your lab values.    On the day of Discharge:  VITAL SIGNS:  Blood pressure (!) 135/45, pulse 69, temperature 98.5 F (36.9 C), temperature source Oral, resp. rate 19, height 4\' 10"  (1.473 m), weight 58.6 kg, SpO2 94 %. PHYSICAL EXAMINATION:  GENERAL:  73 y.o.-year-old patient lying in the bed with no acute distress.  EYES: Pupils equal, round, reactive to light and accommodation. No scleral icterus. Extraocular muscles intact.  HEENT: Head atraumatic, normocephalic. Oropharynx and nasopharynx clear.  NECK:  Supple, no jugular venous distention. No thyroid enlargement, no tenderness.  LUNGS: Normal breath sounds bilaterally, no wheezing, rales,rhonchi or crepitation. No use of accessory muscles of respiration.  CARDIOVASCULAR: S1, S2 normal. No murmurs, rubs, or gallops.  ABDOMEN: Soft, non-tender, non-distended. Bowel sounds present. No organomegaly or mass.  EXTREMITIES: No pedal edema, cyanosis, or clubbing.  NEUROLOGIC: Cranial nerves II through XII are intact. Muscle strength 5/5 in all extremities. Sensation intact. Gait not checked.  PSYCHIATRIC: The patient is alert and oriented x 3.  SKIN: No obvious rash, lesion, or ulcer.  DATA REVIEW:   CBC Recent Labs  Lab 05/06/19 0349  WBC 8.6  HGB 8.6*  HCT 27.3*  PLT 139*    Chemistries  Recent Labs  Lab 05/07/19 0244  NA 140  K 4.5  CL 101  CO2 27  GLUCOSE 166*  BUN 25*  CREATININE 4.47*  CALCIUM 9.2  MG 2.2     Microbiology Results  Results for orders placed or performed during the hospital encounter of 05/04/19  SARS Coronavirus 2 (CEPHEID - Performed in [place] hospital lab), Hosp Order      Status: None   Collection Time: 05/04/19 11:43 PM   Specimen: Nasopharyngeal Swab  Result Value Ref Range Status   SARS Coronavirus 2 NEGATIVE NEGATIVE Final    Comment: (NOTE) If result is NEGATIVE SARS-CoV-2 target nucleic acids are NOT DETECTED. The SARS-CoV-2 RNA is generally detectable in upper and lower  respiratory specimens during the acute phase of infection. The lowest  concentration of SARS-CoV-2 viral copies this assay can detect is 250  copies / mL. A negative result does not preclude SARS-CoV-2 infection  and should not be used as the sole  basis for treatment or other  patient management decisions.  A negative result may occur with  improper specimen collection / handling, submission of specimen other  than nasopharyngeal swab, presence of viral mutation(s) within the  areas targeted by this assay, and inadequate number of viral copies  (<250 copies / mL). A negative result must be combined with clinical  observations, patient history, and epidemiological information. If result is POSITIVE SARS-CoV-2 target nucleic acids are DETECTED. The SARS-CoV-2 RNA is generally detectable in upper and lower  respiratory specimens dur ing the acute phase of infection.  Positive  results are indicative of active infection with SARS-CoV-2.  Clinical  correlation with patient history and other diagnostic information is  necessary to determine patient infection status.  Positive results do  not rule out bacterial infection or co-infection with other viruses. If result is PRESUMPTIVE POSTIVE SARS-CoV-2 nucleic acids MAY BE PRESENT.   A presumptive positive result was obtained on the submitted specimen  and confirmed on repeat testing.  While 2019 novel coronavirus  (SARS-CoV-2) nucleic acids may be present in the submitted sample  additional confirmatory testing may be necessary for epidemiological  and / or clinical management purposes  to differentiate between  SARS-CoV-2 and  other Sarbecovirus currently known to infect humans.  If clinically indicated additional testing with an alternate test  methodology [phone]) is advised. The SARS-CoV-2 RNA is generally  detectable in upper and lower respiratory sp ecimens during the acute  phase of infection. The expected result is Negative. Fact Sheet for Patients:  StrictlyIdeas.no Fact Sheet for Healthcare Providers: BankingDealers.co.za This test is not yet approved or cleared by the [place] FDA and has been authorized for detection and/or diagnosis of SARS-CoV-2 by FDA under an Emergency Use Authorization (EUA).  This EUA will remain in effect (meaning this test can be used) for the duration of the COVID-19 declaration under Section 564(b)(1) of the Act, 21 U.S.C. section 360bbb-3(b)(1), unless the authorization is terminated or revoked sooner. Performed at [facility], [address] [place], [place] [address]   MRSA PCR Screening     Status: None   Collection Time: 05/06/19  8:55 AM   Specimen: Nasopharyngeal  Result Value Ref Range Status   MRSA by PCR NEGATIVE NEGATIVE Final    Comment:        The GeneXpert MRSA Assay (FDA approved for NASAL specimens only), is one component of a comprehensive MRSA colonization surveillance program. It is not intended to diagnose MRSA infection nor to guide or monitor treatment for MRSA infections. Performed at [facility], [address], [place], [place] [address]     RADIOLOGY:  Dg Chest 1 View  Result Date: 05/07/2019 CLINICAL DATA:  Shortness of breath. EXAM: CHEST  1 VIEW COMPARISON:  05/06/2019. FINDINGS: Mediastinum hilar structures normal. Cardiomegaly. Bilateral interstitial prominence, improved from prior exam. Findings suggest improving CHF and/or pneumonitis. No pleural effusion or pneumothorax. IMPRESSION: Cardiomegaly with mild pulmonary interstitial prominence. Improved  interstitial prominence from prior exam. Findings most consistent with improving CHF. Electronically Signed   By: Marcello Moores  Register   On: 05/07/2019 12:16     Management plans discussed with the patient, family and they are in agreement.  CODE STATUS: Full Code   TOTAL TIME TAKING CARE OF THIS PATIENT: 37 minutes.    Coal Jon M.D on 05/08/2019 at 11:33 AM  Between 7am to 6pm - Pager - [phone]  After 6pm go to [email or web address] - [facility]  [facility]  Office  [phone]  CC: Primary care physician; [name], MD   Note: This dictation was prepared with Dragon dictation along with smaller phrase technology. Any transcriptional errors that result from this process are unintentional.

## 2019-05-08 NOTE — Progress Notes (Signed)
Post HD Assessment    05/08/19 1630  Neurological  Level of Consciousness Alert  Orientation Level Oriented X4  Respiratory  Respiratory Pattern Regular  Chest Assessment Chest expansion symmetrical  Bilateral Breath Sounds Clear;Diminished  Cough None  Cardiac  Pulse Regular  Heart Sounds S1, S2  ECG Monitor Yes  Cardiac Rhythm NSR  Vascular  R Radial Pulse +2  L Radial Pulse +2  Psychosocial  Psychosocial (WDL) WDL

## 2019-05-08 NOTE — Progress Notes (Signed)
Pt request to take her AM meds after dialysis

## 2019-05-08 NOTE — Progress Notes (Signed)
Post HD [facility]    05/08/19 1630  Hand-Off documentation  Report given to (Full Name) Georgina Quint, RN   Report received from (Full Name) Beatris Ship, RN   Vital Signs  Temp 98.4 F (36.9 C)  Temp Source Oral  Pulse Rate 73  Pulse Rate Source Monitor  Resp 14  BP (!) 158/46  BP Location Right Arm  BP Method Automatic  Patient Position (if appropriate) Sitting  Oxygen Therapy  SpO2 100 %  O2 Device Room Air  Pulse Oximetry Type Continuous  Pain Assessment  Pain Scale 0-10  Pain Score 0  Dialysis Weight  Weight 57.4 kg  Type of Weight Post-Dialysis  Post-Hemodialysis Assessment  Rinseback Volume (mL) 250 mL  KECN 41.5 V  Dialyzer Clearance Lightly streaked  Duration of HD Treatment -hour(s) 2.5 hour(s)  Hemodialysis Intake (mL) 500 mL  UF Total -Machine (mL) 1505 mL  Net UF (mL) 1005 mL  Tolerated HD Treatment Yes  AVG/AVF Arterial Site Held (minutes) 5 minutes  AVG/AVF Venous Site Held (minutes) 5 minutes  Fistula / Graft Left Forearm Arteriovenous fistula  No Placement Date or Time found.   Placed prior to admission: Yes  Orientation: Left  Access Location: Forearm  Access Type: Arteriovenous fistula  Site Condition No complications  Status Deaccessed  Drainage Description None

## 2019-05-21 ENCOUNTER — Ambulatory Visit: Payer: Medicare Other | Admitting: Nurse Practitioner

## 2019-05-21 ENCOUNTER — Encounter: Payer: Self-pay | Admitting: Nurse Practitioner

## 2019-05-21 NOTE — Progress Notes (Deleted)
Office Visit    [name] Date of Encounter: 05/21/2019  Primary Care Provider:  [name], MD Primary Cardiologist:  Ida Rogue, MD  Chief Complaint    73 year old female with a history of end-stage renal disease on dialysis, HFpEF, moderate aortic stenosis, diabetes, hypertension, hyperlipidemia, GERD, sleep apnea, COPD, anemia of chronic disease, chronic back and chest pain, and nonobstructive carotid arterial disease who presents for follow-up after recent hospitalization related to chest pain and dyspnea.  Past Medical History    Past Medical History:  Diagnosis Date  . (HFpEF) heart failure with preserved ejection fraction ([place])    a. TTE 01/2014: nl LV sys fxn, no valvular abnormalities; b. TTE 11/16: nl EF, mild LVH;  c. 04/2019 Echo: EF 60-65%. DD. Nl RV fxn. Mod AS. 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  . Allergy   . Anemia of chronic disease   . Anxiety   . Aortic atherosclerosis ([place])   . Asthma   . Chronic back pain   . COPD (chronic obstructive pulmonary disease) ([place])   . Diabetes mellitus with complication ([place])   . ESRD on hemodialysis ([place])    a. Tues/Sat; b. 2/2 small kidneys  . Essential hypertension   . Fistula    lower left arm  . GERD (gastroesophageal reflux disease)   . Gout   . History of exercise stress test    a. 01/2014: no evidence of ischemia; b. Lexiscan 08/2015: no sig ischemia, severe GI uptake artifact, low risk; c. CPET @ [facility] 09/2016: exercised 3 min 12 sec on bike without incline, 2.28 METs, VO2 of 8.1, 48% of predicted, indicating mod to sev functional impairment, evidence of blunted HR, stroke volume, and BP augmentation as well as ventilation-perfusion mismatch with exercise  . HLD (hyperlipidemia)   . Moderate aortic stenosis    a. 04/2019 Echo: Mod AS.  . Non-obstructive Carotid arterial disease ([place])    a. 12/2017: <50% bilat ICA dzs.  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 Sleep apnea    Past Surgical History:  Procedure Laterality Date  . carpel tunnel    . GALLBLADDER SURGERY    . PERIPHERAL VASCULAR CATHETERIZATION N/A 04/12/2015   Procedure: A/V Shuntogram/Fistulagram;  Surgeon: Algernon Huxley, MD;  Location: [place] [facility];  Service: Cardiovascular;  Laterality: N/A;  . PERIPHERAL VASCULAR CATHETERIZATION N/A 04/12/2015   Procedure: A/V Shunt Intervention;  Surgeon: Algernon Huxley, MD;  Location: [place] [facility];  Service: Cardiovascular;  Laterality: N/A;  . PERIPHERAL VASCULAR CATHETERIZATION N/A 06/09/2015   Procedure: Dialysis/Perma Catheter Removal;  Surgeon: Katha Cabal, MD;  Location: [place];  Service: Cardiovascular;  Laterality: N/A;    Allergies  Allergies  Allergen Reactions  . Codeine Nausea And Vomiting and Nausea Only  . Enalapril Maleate Other (See Comments)    Other reaction(s): Headache  . Nitrofurantoin Swelling and Rash    Other Reaction: swelling of body  . Sulfamethoxazole-Trimethoprim Swelling  . Vicodin [Hydrocodone-Acetaminophen] Nausea And Vomiting and Nausea Only  . 2,4-D Dimethylamine (Amisol) Rash and Other (See Comments)    Other Reaction: h/a  . Baclofen Other (See Comments) and Nausea Only    lightheadness ,drowsiness , muscle weakness , twitching in hands   . Neosporin [Neomycin-Bacitracin Zn-Polymyx] Other (See Comments) and Rash    Other Reaction: irritation Skin irritation   . Quinine Nausea And Vomiting, Rash and Other (See Comments)  Other Reaction: Vomiting, rash, h/a, vision  . Ultram [Tramadol] Palpitations  . Zocor [Simvastatin] Other (See Comments) and Rash    Other Reaction: muscle spasms Muscle pain and spasms  . Bactrim [Sulfamethoxazole-Trimethoprim] Swelling  . Levodopa Other (See Comments)    Reaction: unknown  . Macrodantin [Nitrofurantoin Macrocrystal] Swelling  . Quinine Derivatives Other (See Comments)    Vertigo,nausea vomiting blurred vision  headache ears sensitivity     History of Present Illness    72 year old female with a history of end-stage renal disease on dialysis, HFpEF, moderate aortic stenosis, diabetes, hypertension, hyperlipidemia, GERD, sleep apnea, COPD, anemia of chronic disease, chronic back and chest pain, and nonobstructive carotid arterial disease.She has no prior history of coronary artery disease and had nonischemic stress tests in 2015 and 2017.  On June 14, she presented to the [facility] emergency department for chest pain and dyspnea.  Work-up was unremarkable and she was discharged home.  Unfortunately, she continued to have progressive dyspnea and chest pain, prompting her to present back to the ED.  Symptoms were similar to typical costochondritis.  She was hypertensive on evaluation and also had a low-grade fever of 99.1.  Saturation was reported at 80% on room air by EMS.  ECG was unremarkable and troponin was mildly elevated at 0.05 with a flat trend.  Our team saw her in consultation.  Echocardiogram showed normal LV function with moderate aortic stenosis.  Chest pain was reproducible and not felt to be cardiac in origin.  No further ischemic evaluation was warranted.  In the setting of baseline anemia which was slightly worse than usual with a hemoglobin of 8.6, it was recommended that either her aspirin or Plavix were discontinued (prior history of TIA), and aspirin was stopped.  Home Medications    Prior to Admission medications   Medication Sig Start Date End Date Taking? Authorizing Provider  albuterol (PROVENTIL HFA;VENTOLIN HFA) 108 (90 Base) MCG/ACT inhaler Inhale 2 puffs into the lungs every 6 (six) hours as needed for wheezing or shortness of breath.    [provider]  albuterol (PROVENTIL) (2.5 MG/3ML) 0.083% nebulizer solution Take 3 mLs (2.5 mg total) by nebulization every 6 (six) hours as needed for wheezing or shortness of breath. 05/04/19   Nance Pear, MD  amLODipine  (NORVASC) 10 MG tablet Take 10 mg by mouth.  12/29/13   [provider]  calcium acetate (PHOSLO) 667 MG capsule Take 667 mg by mouth 3 (three) times daily with meals.     [provider]  carvedilol (COREG) 12.5 MG tablet Take 12.5 mg by mouth 2 (two) times daily with a meal.  01/09/14   [provider]  cetirizine (ZYRTEC) 10 MG tablet Take 10 mg by mouth at bedtime.     [provider]  clopidogrel (PLAVIX) 75 MG tablet Take 1 tablet (75 mg total) by mouth daily. 01/18/18   Epifanio Lesches, MD  cyclobenzaprine (FLEXERIL) 10 MG tablet Take 10 mg by mouth daily as needed for muscle spasms.     [provider]  Fluticasone-Salmeterol (ADVAIR) 250-50 MCG/DOSE AEPB Inhale 1 puff into the lungs 2 (two) times daily as needed (for shortness of breath).     [provider]  furosemide (LASIX) 80 MG tablet Take 1 tablet (80 mg total) by mouth daily. 03/17/18   Loletha Grayer, MD  hydrALAZINE (APRESOLINE) 25 MG tablet Take 25 mg by mouth 3 (three) times daily. 03/25/19   [provider]  HYDROcodone-acetaminophen ([place]) 7.5-325 MG  tablet Take 1-2 tablets by mouth daily as needed (pain).  04/09/19   [provider]  lidocaine-prilocaine (EMLA) cream Apply 1 application topically as needed (prior to treatment).     [provider]  losartan (COZAAR) 50 MG tablet Take 50 mg by mouth daily. 03/18/19   [provider]  omeprazole (PRILOSEC) 20 MG capsule Take 20 mg by mouth 2 (two) times daily before a meal.    [provider]  oxybutynin (DITROPAN XL) 15 MG 24 hr tablet Take 15 mg by mouth daily. 12/24/17   [provider]  pravastatin (PRAVACHOL) 40 MG tablet Take 40 mg by mouth at bedtime.     [provider]  topiramate (TOPAMAX) 25 MG tablet Take 25 mg by mouth as needed (for headaches).     [provider]    Review of Systems    ***.  All other systems reviewed and are otherwise  negative except as noted above.  Physical Exam    VS:  There were no vitals taken for this visit. , BMI There is no height or weight on file to calculate BMI. GEN: Well nourished, well developed, in no acute distress. HEENT: normal. Neck: Supple, no JVD, carotid bruits, or masses. Cardiac: RRR, no murmurs, rubs, or gallops. No clubbing, cyanosis, edema.  Radials/DP/PT 2+ and equal bilaterally.  Respiratory:  Respirations regular and unlabored, clear to auscultation bilaterally. GI: Soft, nontender, nondistended, BS + x 4. MS: no deformity or atrophy. Skin: warm and dry, no rash. Neuro:  Strength and sensation are intact. Psych: Normal affect.  Accessory Clinical Findings    ECG personally reviewed by me today - *** - no acute changes.  Assessment & Plan    1.  ***   Murray Hodgkins, NP 05/21/2019, 8:57 AM

## 2019-05-23 ENCOUNTER — Other Ambulatory Visit: Payer: Self-pay

## 2019-05-23 ENCOUNTER — Emergency Department: Payer: Medicare Other

## 2019-05-23 ENCOUNTER — Inpatient Hospital Stay
Admission: EM | Admit: 2019-05-23 | Discharge: 2019-05-26 | DRG: 291 | Disposition: A | Discharge status: Home Health | Payer: Medicare Other | Attending: Internal Medicine | Admitting: Internal Medicine

## 2019-05-23 DIAGNOSIS — I132 Hypertensive heart and chronic kidney disease with heart failure and with stage 5 chronic kidney disease, or end stage renal disease: Secondary | ICD-10-CM | POA: Diagnosis not present

## 2019-05-23 DIAGNOSIS — I5033 Acute on chronic diastolic (congestive) heart failure: Secondary | ICD-10-CM | POA: Diagnosis present

## 2019-05-23 DIAGNOSIS — J449 Chronic obstructive pulmonary disease, unspecified: Secondary | ICD-10-CM | POA: Diagnosis present

## 2019-05-23 DIAGNOSIS — I350 Nonrheumatic aortic (valve) stenosis: Secondary | ICD-10-CM | POA: Diagnosis present

## 2019-05-23 DIAGNOSIS — J9620 Acute and chronic respiratory failure, unspecified whether with hypoxia or hypercapnia: Secondary | ICD-10-CM

## 2019-05-23 DIAGNOSIS — Z1159 Encounter for screening for other viral diseases: Secondary | ICD-10-CM

## 2019-05-23 DIAGNOSIS — M109 Gout, unspecified: Secondary | ICD-10-CM | POA: Diagnosis present

## 2019-05-23 DIAGNOSIS — Z7902 Long term (current) use of antithrombotics/antiplatelets: Secondary | ICD-10-CM

## 2019-05-23 DIAGNOSIS — Z7951 Long term (current) use of inhaled steroids: Secondary | ICD-10-CM

## 2019-05-23 DIAGNOSIS — Z8249 Family history of ischemic heart disease and other diseases of the circulatory system: Secondary | ICD-10-CM

## 2019-05-23 DIAGNOSIS — N3281 Overactive bladder: Secondary | ICD-10-CM | POA: Diagnosis present

## 2019-05-23 DIAGNOSIS — N186 End stage renal disease: Secondary | ICD-10-CM | POA: Diagnosis present

## 2019-05-23 DIAGNOSIS — I5031 Acute diastolic (congestive) heart failure: Secondary | ICD-10-CM

## 2019-05-23 DIAGNOSIS — Z8349 Family history of other endocrine, nutritional and metabolic diseases: Secondary | ICD-10-CM

## 2019-05-23 DIAGNOSIS — E1122 Type 2 diabetes mellitus with diabetic chronic kidney disease: Secondary | ICD-10-CM | POA: Diagnosis present

## 2019-05-23 DIAGNOSIS — Z992 Dependence on renal dialysis: Secondary | ICD-10-CM

## 2019-05-23 DIAGNOSIS — I2510 Atherosclerotic heart disease of native coronary artery without angina pectoris: Secondary | ICD-10-CM | POA: Diagnosis present

## 2019-05-23 DIAGNOSIS — I509 Heart failure, unspecified: Secondary | ICD-10-CM | POA: Diagnosis not present

## 2019-05-23 DIAGNOSIS — D72829 Elevated white blood cell count, unspecified: Secondary | ICD-10-CM | POA: Diagnosis present

## 2019-05-23 DIAGNOSIS — N2581 Secondary hyperparathyroidism of renal origin: Secondary | ICD-10-CM | POA: Diagnosis present

## 2019-05-23 DIAGNOSIS — D631 Anemia in chronic kidney disease: Secondary | ICD-10-CM | POA: Diagnosis present

## 2019-05-23 DIAGNOSIS — Z66 Do not resuscitate: Secondary | ICD-10-CM | POA: Diagnosis present

## 2019-05-23 DIAGNOSIS — I248 Other forms of acute ischemic heart disease: Secondary | ICD-10-CM | POA: Diagnosis present

## 2019-05-23 DIAGNOSIS — Z79891 Long term (current) use of opiate analgesic: Secondary | ICD-10-CM

## 2019-05-23 DIAGNOSIS — R079 Chest pain, unspecified: Secondary | ICD-10-CM

## 2019-05-23 DIAGNOSIS — K219 Gastro-esophageal reflux disease without esophagitis: Secondary | ICD-10-CM | POA: Diagnosis present

## 2019-05-23 DIAGNOSIS — M940 Chondrocostal junction syndrome [Tietze]: Secondary | ICD-10-CM | POA: Diagnosis present

## 2019-05-23 DIAGNOSIS — J9621 Acute and chronic respiratory failure with hypoxia: Secondary | ICD-10-CM | POA: Diagnosis present

## 2019-05-23 LAB — BLOOD GAS, ARTERIAL
Acid-Base Excess: 6.4 mmol/L — ABNORMAL HIGH (ref 0.0–2.0)
Bicarbonate: 31.3 mmol/L — ABNORMAL HIGH (ref 20.0–28.0)
Delivery systems: POSITIVE
Expiratory PAP: 8
FIO2: 0.32
Inspiratory PAP: 16
O2 Saturation: 97.9 %
Patient temperature: 37
RATE: 12 resp/min
pCO2 arterial: 45 mmHg (ref 32.0–48.0)
pH, Arterial: 7.45 (ref 7.350–7.450)
pO2, Arterial: 98 mmHg (ref 83.0–108.0)

## 2019-05-23 LAB — CBC WITH DIFFERENTIAL/PLATELET
Abs Immature Granulocytes: 0.07 10*3/uL (ref 0.00–0.07)
Basophils Absolute: 0 10*3/uL (ref 0.0–0.1)
Basophils Relative: 0 %
Eosinophils Absolute: 0.3 10*3/uL (ref 0.0–0.5)
Eosinophils Relative: 2 %
HCT: 30.8 % — ABNORMAL LOW (ref 36.0–46.0)
Hemoglobin: 9.8 g/dL — ABNORMAL LOW (ref 12.0–15.0)
Immature Granulocytes: 1 %
Lymphocytes Relative: 8 %
Lymphs Abs: 1.1 10*3/uL (ref 0.7–4.0)
MCH: 29.6 pg (ref 26.0–34.0)
MCHC: 31.8 g/dL (ref 30.0–36.0)
MCV: 93.1 fL (ref 80.0–100.0)
Monocytes Absolute: 0.7 10*3/uL (ref 0.1–1.0)
Monocytes Relative: 5 %
Neutro Abs: 12.4 10*3/uL — ABNORMAL HIGH (ref 1.7–7.7)
Neutrophils Relative %: 84 %
Platelets: 182 10*3/uL (ref 150–400)
RBC: 3.31 MIL/uL — ABNORMAL LOW (ref 3.87–5.11)
RDW: 14 % (ref 11.5–15.5)
WBC: 14.7 10*3/uL — ABNORMAL HIGH (ref 4.0–10.5)
nRBC: 0 % (ref 0.0–0.2)

## 2019-05-23 LAB — COMPREHENSIVE METABOLIC PANEL
ALT: 12 U/L (ref 0–44)
AST: 17 U/L (ref 15–41)
Albumin: 4.2 g/dL (ref 3.5–5.0)
Alkaline Phosphatase: 86 U/L (ref 38–126)
Anion gap: 14 (ref 5–15)
BUN: 32 mg/dL — ABNORMAL HIGH (ref 8–23)
CO2: 28 mmol/L (ref 22–32)
Calcium: 7.8 mg/dL — ABNORMAL LOW (ref 8.9–10.3)
Chloride: 97 mmol/L — ABNORMAL LOW (ref 98–111)
Creatinine, Ser: 5.33 mg/dL — ABNORMAL HIGH (ref 0.44–1.00)
GFR calc Af Amer: 9 mL/min — ABNORMAL LOW (ref >60)
GFR calc non Af Amer: 7 mL/min — ABNORMAL LOW (ref >60)
Glucose, Bld: 208 mg/dL — ABNORMAL HIGH (ref 70–99)
Potassium: 4 mmol/L (ref 3.5–5.1)
Sodium: 139 mmol/L (ref 135–145)
Total Bilirubin: 0.7 mg/dL (ref 0.3–1.2)
Total Protein: 6.8 g/dL (ref 6.5–8.1)

## 2019-05-23 LAB — TROPONIN I (HIGH SENSITIVITY): Troponin I (High Sensitivity): 30 ng/L — ABNORMAL HIGH (ref <18)

## 2019-05-23 LAB — LACTIC ACID, PLASMA: Lactic Acid, Venous: 1.7 mmol/L (ref 0.5–1.9)

## 2019-05-23 LAB — BRAIN NATRIURETIC PEPTIDE: B Natriuretic Peptide: 895 pg/mL — ABNORMAL HIGH (ref 0.0–100.0)

## 2019-05-23 MED ORDER — FUROSEMIDE 10 MG/ML IJ SOLN
80.0000 mg | Freq: Once | INTRAMUSCULAR | Status: AC
  Administered 2019-05-24: 80 mg via INTRAVENOUS
  Filled 2019-05-23: qty 8

## 2019-05-23 NOTE — ED Triage Notes (Addendum)
Pt comes via [facility] from home with c/o SOB. Pt recently seen here for same. EMS reports when they arrived pt was in low 80s and then placed on nonrebreather and increased to upper 80s. EMS placed pt on CPAP. Pt given 2 duonebs, 2 nitro sprays and 1 1/2 inch nitro paste, 125 solumedrol.  BP-198/95 before nitro then 133/59. Pt arrives on CPAP and alert and oriented.  Respiratory at bedside, pt placed on BIPAP.  Pt has hx of COPD and CHF.

## 2019-05-23 NOTE — ED Provider Notes (Signed)
[facility] Emergency Department Provider Note   ____________________________________________   First MD Initiated Contact with Patient 05/23/19 2306     (approximate)  I have reviewed the triage vital signs and the nursing notes.   HISTORY  Chief Complaint Shortness of Breath    HPI Phyllis [name] is a 73 y.o. female brought to the ED from home via EMS with a chief complaint of respiratory distress. Patient has a history of ESRD on HD T/Th/Sat who presents on CPAP. Initial saturations on RA in the 80%s. Given 2 duonebs, IV solumedrol and 2 nitro sprays with improvement in her breathing. Also improved her chest discomfort which she describes as her typical costocondritis. Denies recent fever, cough, abdominal pain, nausea, vomiting.        Past Medical History:  Diagnosis Date   (HFpEF) heart failure with preserved ejection fraction ([place])    a. TTE 01/2014: nl LV sys fxn, no valvular abnormalities; b. TTE 11/16: nl EF, mild LVH;  c. 04/2019 Echo: EF 60-65%. DD. Nl RV fxn. Mod AS. 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   Allergy    Anemia of chronic disease    Anxiety    Aortic atherosclerosis (HCC)    Asthma    Chronic back pain    COPD (chronic obstructive pulmonary disease) ([place])    Diabetes mellitus with complication ([place])    ESRD on hemodialysis ([place])    a. Tues/Sat; b. 2/2 small kidneys   Essential hypertension    Fistula    lower left arm   GERD (gastroesophageal reflux disease)    Gout    History of exercise stress test    a. 01/2014: no evidence of ischemia; b. Lexiscan 08/2015: no sig ischemia, severe GI uptake artifact, low risk; c. CPET @ [facility] 09/2016: exercised 3 min 12 sec on bike without incline, 2.28 METs, VO2 of 8.1, 48% of predicted, indicating mod to sev functional impairment, evidence of blunted HR, stroke volume, and BP augmentation as well as ventilation-perfusion mismatch with exercise   HLD  (hyperlipidemia)    Moderate aortic stenosis    a. 04/2019 Echo: Mod AS.   Non-obstructive Carotid arterial disease ([place])    a. 12/2017: <50% bilat ICA dzs.   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    right chest   Sleep apnea     Patient Active Problem List   Diagnosis Date Noted   Acute CHF (congestive heart failure) ([place]) 05/24/2019   Hyperkalemia 11/29/2018   Chronic diastolic heart failure ([place]) 11/02/2018   HTN (hypertension) 11/02/2018   Uncontrolled hypertension 10/21/2018   Pressure injury of skin 03/16/2018   CVA (cerebral vascular accident) ([place]) 01/16/2018   Aortic atherosclerosis ([place]) 12/11/2017   Chest pain 09/18/2017   Hyperlipidemia 15/94/5859   Complication from renal dialysis device 04/12/2015   SOB (shortness of breath) 02/01/2015   End stage renal disease on dialysis ([place]) 02/01/2015   Type 2 diabetes mellitus with other specified complication ([place]) 29/24/4628   Asthma 02/01/2015   Acute on chronic diastolic CHF (congestive heart failure) ([place]) 02/01/2015    Past Surgical History:  Procedure Laterality Date   carpel tunnel     GALLBLADDER SURGERY     PERIPHERAL VASCULAR CATHETERIZATION N/A 04/12/2015   Procedure: A/V Shuntogram/Fistulagram;  Surgeon: Algernon Huxley, MD;  Location: [place] [facility];  Service: Cardiovascular;  Laterality: N/A;   PERIPHERAL VASCULAR CATHETERIZATION N/A 04/12/2015   Procedure: A/V Shunt Intervention;  Surgeon: Erskine Squibb  Lucky Cowboy, MD;  Location: [place] [facility];  Service: Cardiovascular;  Laterality: N/A;   PERIPHERAL VASCULAR CATHETERIZATION N/A 06/09/2015   Procedure: Dialysis/Perma Catheter Removal;  Surgeon: Katha Cabal, MD;  Location: [place] [facility];  Service: Cardiovascular;  Laterality: N/A;    Prior to Admission medications   Medication Sig Start Date End Date Taking? Authorizing Provider  albuterol (PROVENTIL HFA;VENTOLIN HFA) 108 (90 Base) MCG/ACT inhaler Inhale 2 puffs into the  lungs every 6 (six) hours as needed for wheezing or shortness of breath.   Yes [provider]  albuterol (PROVENTIL) (2.5 MG/3ML) 0.083% nebulizer solution Take 3 mLs (2.5 mg total) by nebulization every 6 (six) hours as needed for wheezing or shortness of breath. 05/04/19  Yes Nance Pear, MD  amLODipine (NORVASC) 10 MG tablet Take 10 mg by mouth.  12/29/13  Yes [provider]  ANECREAM 4 % cream APPLY TOPICALLY 2 (TWO) TIMES DAILY FOR 30 DAYS 05/12/19  Yes [provider]  calcium acetate (PHOSLO) 667 MG capsule Take 667 mg by mouth 3 (three) times daily with meals.    Yes [provider]  carvedilol (COREG) 12.5 MG tablet Take 12.5 mg by mouth 2 (two) times daily with a meal.  01/09/14  Yes [provider]  cetirizine (ZYRTEC) 10 MG tablet Take 10 mg by mouth at bedtime.    Yes [provider]  cyclobenzaprine (FLEXERIL) 10 MG tablet Take 10 mg by mouth daily as needed for muscle spasms.    Yes [provider]  Fluticasone-Salmeterol (ADVAIR) 250-50 MCG/DOSE AEPB Inhale 1 puff into the lungs 2 (two) times daily as needed (for shortness of breath).    Yes [provider]  furosemide (LASIX) 80 MG tablet Take 1 tablet (80 mg total) by mouth daily. 03/17/18  Yes Wieting, Richard, MD  hydrALAZINE (APRESOLINE) 25 MG tablet Take 25 mg by mouth 3 (three) times daily. 03/25/19  Yes [provider]  HYDROcodone-acetaminophen (NORCO) 7.5-325 MG tablet Take 1-2 tablets by mouth daily as needed (pain).  04/09/19  Yes [provider]  lidocaine-prilocaine (EMLA) cream Apply 1 application topically as needed (prior to treatment).    Yes [provider]  losartan (COZAAR) 50 MG tablet Take 50 mg by mouth daily. 03/18/19  Yes [provider]  omeprazole (PRILOSEC) 20 MG capsule Take 20 mg by mouth 2 (two) times daily before a meal.   Yes [provider]  ondansetron (ZOFRAN) 4 MG tablet Take 4 mg by  mouth every 8 (eight) hours as needed. 05/20/19  Yes [provider]  oxybutynin (DITROPAN XL) 15 MG 24 hr tablet Take 15 mg by mouth daily. 12/24/17  Yes [provider]  pravastatin (PRAVACHOL) 40 MG tablet Take 40 mg by mouth at bedtime.    Yes [provider]  topiramate (TOPAMAX) 25 MG tablet Take 25 mg by mouth as needed (for headaches).    Yes [provider]  clopidogrel (PLAVIX) 75 MG tablet Take 1 tablet (75 mg total) by mouth daily. 01/18/18   Epifanio Lesches, MD  predniSONE (DELTASONE) 20 MG tablet Take 40 mg by mouth daily with breakfast.    [provider]    Allergies Codeine; Enalapril maleate; Nitrofurantoin; Sulfamethoxazole-trimethoprim; Vicodin [hydrocodone-acetaminophen]; 2,4-d dimethylamine (amisol); Baclofen; Neosporin [neomycin-bacitracin zn-polymyx]; Quinine; Ultram [tramadol]; Zocor [simvastatin]; Bactrim [sulfamethoxazole-trimethoprim]; Levodopa; Macrodantin [nitrofurantoin macrocrystal]; and Quinine derivatives  Family History  Problem Relation Age of Onset   Hypertension Mother    Hyperlipidemia Mother  Heart disease Father    Heart attack Father 44   Hypertension Father    Hyperlipidemia Father    Heart disease Brother        CABG    Heart attack Brother    Breast cancer Neg Hx     Social History Social History   Tobacco Use   Smoking status: Never Smoker   Smokeless tobacco: Never Used  Substance Use Topics   Alcohol use: No   Drug use: No    Review of Systems  Constitutional: No fever/chills Eyes: No visual changes. ENT: No sore throat. Cardiovascular: Positie for chest pain. Respiratory: Positive for shortness of breath. Gastrointestinal: No abdominal pain.  No nausea, no vomiting.  No diarrhea.  No constipation. Genitourinary: Negative for dysuria. Musculoskeletal: Negative for back pain. Skin: Negative for rash. Neurological: Negative for headaches, focal weakness or  numbness.   ____________________________________________   PHYSICAL EXAM:  VITAL SIGNS: ED Triage Vitals  Enc Vitals Group     BP 05/23/19 2255 (!) 125/59     Pulse Rate 05/23/19 2256 (!) 106     Resp 05/23/19 2255 (!) 31     Temp 05/23/19 2304 98.1 F (36.7 C)     Temp src --      SpO2 05/23/19 2256 100 %     Weight 05/23/19 2302 126 lb 8.7 oz (57.4 kg)     Height 05/23/19 2302 4\' 10"  (1.473 m)     Head Circumference --      Peak Flow --      Pain Score 05/23/19 2254 5     Pain Loc --      Pain Edu? --      Excl. in [place]? --     Constitutional: Alert and oriented. Chronically ill appearing and in moderate acute distress. Eyes: Conjunctivae are normal. PERRL. EOMI. Head: Atraumatic. Nose: No congestion/rhinnorhea. Mouth/Throat: Mucous membranes are moist.  Oropharynx non-erythematous. Neck: No stridor.   Cardiovascular: Normal rate, regular rhythm. Grossly normal heart sounds.  Good peripheral circulation. Respiratory: Increased respiratory effort.  No retractions. Lungs with bibasilar rales. Gastrointestinal: Soft and nontender. No distention. No abdominal bruits. No CVA tenderness. Musculoskeletal: LUE AV fistula. No lower extremity tenderness nor edema.  No joint effusions. Neurologic:  Normal speech and language. No gross focal neurologic deficits are appreciated.  Skin:  Skin is warm, dry and intact. No rash noted. Psychiatric: Mood and affect are normal. Speech and behavior are normal.  ____________________________________________   LABS (all labs ordered are listed, but only abnormal results are displayed)  Labs Reviewed  CBC WITH DIFFERENTIAL/PLATELET - Abnormal; Notable for the following components:      Result Value   WBC 14.7 (*)    RBC 3.31 (*)    Hemoglobin 9.8 (*)    HCT 30.8 (*)    Neutro Abs 12.4 (*)    All other components within normal limits  COMPREHENSIVE METABOLIC PANEL - Abnormal; Notable for the following components:   Chloride 97 (*)     Glucose, Bld 208 (*)    BUN 32 (*)    Creatinine, Ser 5.33 (*)    Calcium 7.8 (*)    GFR calc non Af Amer 7 (*)    GFR calc Af Amer 9 (*)    All other components within normal limits  TROPONIN I (HIGH SENSITIVITY) - Abnormal; Notable for the following components:   Troponin I (High Sensitivity) 30 (*)    All other components within normal limits  BRAIN  NATRIURETIC PEPTIDE - Abnormal; Notable for the following components:   B Natriuretic Peptide 895.0 (*)    All other components within normal limits  BLOOD GAS, ARTERIAL - Abnormal; Notable for the following components:   Bicarbonate 31.3 (*)    Acid-Base Excess 6.4 (*)    All other components within normal limits  CBC - Abnormal; Notable for the following components:   WBC 12.2 (*)    RBC 3.26 (*)    Hemoglobin 9.7 (*)    HCT 30.1 (*)    Platelets 125 (*)    All other components within normal limits  CREATININE, SERUM - Abnormal; Notable for the following components:   Creatinine, Ser 5.56 (*)    GFR calc non Af Amer 7 (*)    GFR calc Af Amer 8 (*)    All other components within normal limits  SARS CORONAVIRUS 2 (HOSPITAL ORDER, [facility])  CULTURE, BLOOD (ROUTINE X 2)  CULTURE, BLOOD (ROUTINE X 2)  LACTIC ACID, PLASMA  CBC WITH DIFFERENTIAL/PLATELET   ____________________________________________  EKG  ED ECG REPORT I, Deedee Lybarger J, the attending physician, personally viewed and interpreted this ECG.   Date: 05/23/2019  EKG Time: 2256  Rate: 105  Rhythm: sinus tachycardia  Axis: Normal  Intervals:none  ST&T Change: Nonspecific ____________________________________________  RADIOLOGY  ED MD interpretation:  Pulmonary edema  Official radiology report(s): Dg Chest Port 1 View  Result Date: 05/23/2019 CLINICAL DATA:  Shortness of breath. EXAM: PORTABLE CHEST 1 VIEW COMPARISON:  05/07/2019 FINDINGS: Unchanged cardiomegaly and mediastinal contours. Tortuosity and atherosclerosis of the  thoracic aorta. Worsening interstitial and peribronchial thickening consistent with pulmonary edema. No definite pleural fluid or focal airspace disease. Overlying support apparatus partially obscure the left chest. IMPRESSION: Worsening pulmonary edema with grossly unchanged cardiomegaly. Electronically Signed   By: Keith Rake M.D.   On: 05/23/2019 23:24    ____________________________________________   PROCEDURES  Procedure(s) performed (including Critical Care):  Procedures  CRITICAL CARE Performed by: Paulette Blanch   Total critical care time: 30 minutes  Critical care time was exclusive of separately billable procedures and treating other patients.  Critical care was necessary to treat or prevent imminent or life-threatening deterioration.  Critical care was time spent personally by me on the following activities: development of treatment plan with patient and/or surrogate as well as nursing, discussions with consultants, evaluation of patient's response to treatment, examination of patient, obtaining history from patient or surrogate, ordering and performing treatments and interventions, ordering and review of laboratory studies, ordering and review of radiographic studies, pulse oximetry and re-evaluation of patient's condition. ____________________________________________   INITIAL IMPRESSION / ASSESSMENT AND PLAN / ED COURSE  As part of my medical decision making, I reviewed the following data within the [facility] notes reviewed and incorporated, Labs reviewed, EKG interpreted, Old EKG reviewed, Old chart reviewed, Radiograph reviewed, Discussed with admitting physician and Notes from prior ED visits     JALEXUS BRETT was evaluated in Emergency Department on 05/24/2019 for the symptoms described in the history of present illness. She was evaluated in the context of the global COVID-19 pandemic, which necessitated consideration that the patient  might be at risk for infection with the SARS-CoV-2 virus that causes COVID-19. Institutional protocols and algorithms that pertain to the evaluation of patients at risk for COVID-19 are in a state of rapid change based on information released by regulatory bodies including the CDC and federal and state organizations. These policies  and algorithms were followed during the patient's care in the ED.   73 year old female with ESRD on HD who presents in respiratory distress. Differential includes, but is not limited to, viral syndrome, bronchitis including COPD exacerbation, pneumonia, reactive airway disease including asthma, CHF including exacerbation with or without pulmonary/interstitial edema, pneumothorax, ACS, thoracic trauma, and pulmonary embolism.  Placed on BiPAP upon her arrival to the ED. Will obtain septic workup, Covid swab. Anticipate hospitalization.   Clinical Course as of May 23 245  Fri May 23, 2019  2350 Patient looking more comfortable on BiPAP. Lab results noted. Patient still makes urine; will administer IV Lasix. Awaiting Covid results.   [JS]    Clinical Course User Index [JS] Paulette Blanch, MD     ____________________________________________   FINAL CLINICAL IMPRESSION(S) / ED DIAGNOSES  Final diagnoses:  Acute on chronic congestive heart failure, unspecified heart failure type ([place])  Acute on chronic respiratory failure, unspecified whether with hypoxia or hypercapnia (HCC)  ESRD (end stage renal disease) on dialysis [facility])  Chest pain, unspecified type     ED Discharge Orders    None       Note:  This document was prepared using Dragon voice recognition software and may include unintentional dictation errors.   Paulette Blanch, MD 05/24/19 [phone]

## 2019-05-24 DIAGNOSIS — I509 Heart failure, unspecified: Secondary | ICD-10-CM

## 2019-05-24 DIAGNOSIS — I132 Hypertensive heart and chronic kidney disease with heart failure and with stage 5 chronic kidney disease, or end stage renal disease: Secondary | ICD-10-CM | POA: Diagnosis present

## 2019-05-24 DIAGNOSIS — N186 End stage renal disease: Secondary | ICD-10-CM | POA: Diagnosis present

## 2019-05-24 DIAGNOSIS — N2581 Secondary hyperparathyroidism of renal origin: Secondary | ICD-10-CM | POA: Diagnosis present

## 2019-05-24 DIAGNOSIS — M940 Chondrocostal junction syndrome [Tietze]: Secondary | ICD-10-CM | POA: Diagnosis present

## 2019-05-24 DIAGNOSIS — Z992 Dependence on renal dialysis: Secondary | ICD-10-CM | POA: Diagnosis not present

## 2019-05-24 DIAGNOSIS — Z1159 Encounter for screening for other viral diseases: Secondary | ICD-10-CM | POA: Diagnosis not present

## 2019-05-24 DIAGNOSIS — Z8349 Family history of other endocrine, nutritional and metabolic diseases: Secondary | ICD-10-CM | POA: Diagnosis not present

## 2019-05-24 DIAGNOSIS — I5033 Acute on chronic diastolic (congestive) heart failure: Secondary | ICD-10-CM | POA: Diagnosis present

## 2019-05-24 DIAGNOSIS — I350 Nonrheumatic aortic (valve) stenosis: Secondary | ICD-10-CM | POA: Diagnosis present

## 2019-05-24 DIAGNOSIS — I5031 Acute diastolic (congestive) heart failure: Secondary | ICD-10-CM | POA: Diagnosis not present

## 2019-05-24 DIAGNOSIS — D631 Anemia in chronic kidney disease: Secondary | ICD-10-CM | POA: Diagnosis present

## 2019-05-24 DIAGNOSIS — M109 Gout, unspecified: Secondary | ICD-10-CM | POA: Diagnosis present

## 2019-05-24 DIAGNOSIS — J449 Chronic obstructive pulmonary disease, unspecified: Secondary | ICD-10-CM | POA: Diagnosis present

## 2019-05-24 DIAGNOSIS — Z7951 Long term (current) use of inhaled steroids: Secondary | ICD-10-CM | POA: Diagnosis not present

## 2019-05-24 DIAGNOSIS — Z79891 Long term (current) use of opiate analgesic: Secondary | ICD-10-CM | POA: Diagnosis not present

## 2019-05-24 DIAGNOSIS — I2510 Atherosclerotic heart disease of native coronary artery without angina pectoris: Secondary | ICD-10-CM | POA: Diagnosis present

## 2019-05-24 DIAGNOSIS — Z7902 Long term (current) use of antithrombotics/antiplatelets: Secondary | ICD-10-CM | POA: Diagnosis not present

## 2019-05-24 DIAGNOSIS — D72829 Elevated white blood cell count, unspecified: Secondary | ICD-10-CM | POA: Diagnosis present

## 2019-05-24 DIAGNOSIS — N3281 Overactive bladder: Secondary | ICD-10-CM | POA: Diagnosis present

## 2019-05-24 DIAGNOSIS — I248 Other forms of acute ischemic heart disease: Secondary | ICD-10-CM | POA: Diagnosis present

## 2019-05-24 DIAGNOSIS — J9621 Acute and chronic respiratory failure with hypoxia: Secondary | ICD-10-CM | POA: Diagnosis present

## 2019-05-24 DIAGNOSIS — K219 Gastro-esophageal reflux disease without esophagitis: Secondary | ICD-10-CM | POA: Diagnosis present

## 2019-05-24 DIAGNOSIS — E1122 Type 2 diabetes mellitus with diabetic chronic kidney disease: Secondary | ICD-10-CM | POA: Diagnosis present

## 2019-05-24 DIAGNOSIS — Z8249 Family history of ischemic heart disease and other diseases of the circulatory system: Secondary | ICD-10-CM | POA: Diagnosis not present

## 2019-05-24 DIAGNOSIS — Z66 Do not resuscitate: Secondary | ICD-10-CM | POA: Diagnosis present

## 2019-05-24 LAB — CBC WITH DIFFERENTIAL/PLATELET
Abs Immature Granulocytes: 0.06 10*3/uL (ref 0.00–0.07)
Basophils Absolute: 0 10*3/uL (ref 0.0–0.1)
Basophils Relative: 0 %
Eosinophils Absolute: 0 10*3/uL (ref 0.0–0.5)
Eosinophils Relative: 0 %
HCT: 30 % — ABNORMAL LOW (ref 36.0–46.0)
Hemoglobin: 9.6 g/dL — ABNORMAL LOW (ref 12.0–15.0)
Immature Granulocytes: 1 %
Lymphocytes Relative: 4 %
Lymphs Abs: 0.4 10*3/uL — ABNORMAL LOW (ref 0.7–4.0)
MCH: 29.6 pg (ref 26.0–34.0)
MCHC: 32 g/dL (ref 30.0–36.0)
MCV: 92.6 fL (ref 80.0–100.0)
Monocytes Absolute: 0.2 10*3/uL (ref 0.1–1.0)
Monocytes Relative: 2 %
Neutro Abs: 8.8 10*3/uL — ABNORMAL HIGH (ref 1.7–7.7)
Neutrophils Relative %: 93 %
Platelets: 125 10*3/uL — ABNORMAL LOW (ref 150–400)
RBC: 3.24 MIL/uL — ABNORMAL LOW (ref 3.87–5.11)
RDW: 13.6 % (ref 11.5–15.5)
WBC: 9.5 10*3/uL (ref 4.0–10.5)
nRBC: 0 % (ref 0.0–0.2)

## 2019-05-24 LAB — CREATININE, SERUM
Creatinine, Ser: 5.56 mg/dL — ABNORMAL HIGH (ref 0.44–1.00)
GFR calc Af Amer: 8 mL/min — ABNORMAL LOW (ref >60)
GFR calc non Af Amer: 7 mL/min — ABNORMAL LOW (ref >60)

## 2019-05-24 LAB — CBC
HCT: 30.1 % — ABNORMAL LOW (ref 36.0–46.0)
Hemoglobin: 9.7 g/dL — ABNORMAL LOW (ref 12.0–15.0)
MCH: 29.8 pg (ref 26.0–34.0)
MCHC: 32.2 g/dL (ref 30.0–36.0)
MCV: 92.3 fL (ref 80.0–100.0)
Platelets: 125 10*3/uL — ABNORMAL LOW (ref 150–400)
RBC: 3.26 MIL/uL — ABNORMAL LOW (ref 3.87–5.11)
RDW: 13.7 % (ref 11.5–15.5)
WBC: 12.2 10*3/uL — ABNORMAL HIGH (ref 4.0–10.5)
nRBC: 0 % (ref 0.0–0.2)

## 2019-05-24 LAB — GLUCOSE, CAPILLARY
Glucose-Capillary: 159 mg/dL — ABNORMAL HIGH (ref 70–99)
Glucose-Capillary: 170 mg/dL — ABNORMAL HIGH (ref 70–99)
Glucose-Capillary: 117 mg/dL — ABNORMAL HIGH (ref 70–99)
Glucose-Capillary: 209 mg/dL — ABNORMAL HIGH (ref 70–99)
Glucose-Capillary: 188 mg/dL — ABNORMAL HIGH (ref 70–99)

## 2019-05-24 LAB — SARS CORONAVIRUS 2 BY RT PCR (HOSPITAL ORDER, PERFORMED IN ~~LOC~~ HOSPITAL LAB): SARS Coronavirus 2: NEGATIVE

## 2019-05-24 LAB — TROPONIN I (HIGH SENSITIVITY)
Troponin I (High Sensitivity): 79 ng/L — ABNORMAL HIGH (ref <18)
Troponin I (High Sensitivity): 93 ng/L — ABNORMAL HIGH (ref <18)

## 2019-05-24 LAB — MRSA PCR SCREENING: MRSA by PCR: NEGATIVE

## 2019-05-24 LAB — PHOSPHORUS: Phosphorus: 3.9 mg/dL (ref 2.5–4.6)

## 2019-05-24 MED ORDER — PRAVASTATIN SODIUM 40 MG PO TABS
40.0000 mg | ORAL_TABLET | Freq: Every day | ORAL | Status: DC
  Administered 2019-05-24 – 2019-05-25 (×2): 40 mg via ORAL
  Filled 2019-05-24 (×3): qty 1

## 2019-05-24 MED ORDER — FUROSEMIDE 10 MG/ML IJ SOLN
80.0000 mg | Freq: Two times a day (BID) | INTRAMUSCULAR | Status: DC
  Administered 2019-05-24 – 2019-05-26 (×4): 80 mg via INTRAVENOUS
  Filled 2019-05-24 (×4): qty 8

## 2019-05-24 MED ORDER — MORPHINE SULFATE (PF) 2 MG/ML IV SOLN
2.0000 mg | Freq: Once | INTRAVENOUS | Status: AC
  Administered 2019-05-24: 2 mg via INTRAVENOUS

## 2019-05-24 MED ORDER — ALBUTEROL SULFATE (2.5 MG/3ML) 0.083% IN NEBU
2.5000 mg | INHALATION_SOLUTION | Freq: Four times a day (QID) | RESPIRATORY_TRACT | Status: DC | PRN
  Administered 2019-05-25: 2.5 mg via RESPIRATORY_TRACT
  Filled 2019-05-24: qty 3

## 2019-05-24 MED ORDER — SODIUM CHLORIDE 0.9% FLUSH: 3.0000 mL | INTRAVENOUS | Status: DC | PRN

## 2019-05-24 MED ORDER — PENTAFLUOROPROP-TETRAFLUOROETH EX AERO
1.0000 "application " | INHALATION_SPRAY | CUTANEOUS | Status: DC | PRN
  Filled 2019-05-24: qty 30

## 2019-05-24 MED ORDER — TOPIRAMATE 25 MG PO TABS
25.0000 mg | ORAL_TABLET | ORAL | Status: DC | PRN
  Filled 2019-05-24: qty 1

## 2019-05-24 MED ORDER — HYDROCODONE-ACETAMINOPHEN 5-325 MG PO TABS
1.0000 | ORAL_TABLET | Freq: Once | ORAL | Status: AC | PRN
  Administered 2019-05-25: 1 via ORAL
  Filled 2019-05-24: qty 1

## 2019-05-24 MED ORDER — CHLORHEXIDINE GLUCONATE CLOTH 2 % EX PADS
6.0000 | MEDICATED_PAD | Freq: Every day | CUTANEOUS | Status: DC
  Administered 2019-05-24 – 2019-05-26 (×2): 6 via TOPICAL

## 2019-05-24 MED ORDER — PREDNISONE 20 MG PO TABS
40.0000 mg | ORAL_TABLET | Freq: Every day | ORAL | Status: DC
  Administered 2019-05-24 – 2019-05-26 (×3): 40 mg via ORAL
  Filled 2019-05-24 (×4): qty 2

## 2019-05-24 MED ORDER — HEPARIN SODIUM (PORCINE) 5000 UNIT/ML IJ SOLN
5000.0000 [IU] | Freq: Two times a day (BID) | INTRAMUSCULAR | Status: DC
  Administered 2019-05-24 – 2019-05-26 (×6): 5000 [IU] via SUBCUTANEOUS
  Filled 2019-05-24 (×6): qty 1

## 2019-05-24 MED ORDER — CALCIUM ACETATE (PHOS BINDER) 667 MG PO CAPS
667.0000 mg | ORAL_CAPSULE | Freq: Three times a day (TID) | ORAL | Status: DC
  Administered 2019-05-24 – 2019-05-26 (×8): 667 mg via ORAL
  Filled 2019-05-24 (×9): qty 1

## 2019-05-24 MED ORDER — LORATADINE 10 MG PO TABS
10.0000 mg | ORAL_TABLET | Freq: Every day | ORAL | Status: DC
  Administered 2019-05-24 – 2019-05-26 (×3): 10 mg via ORAL
  Filled 2019-05-24 (×3): qty 1

## 2019-05-24 MED ORDER — CLOPIDOGREL BISULFATE 75 MG PO TABS
75.0000 mg | ORAL_TABLET | Freq: Every day | ORAL | Status: DC
  Administered 2019-05-24 – 2019-05-26 (×3): 75 mg via ORAL
  Filled 2019-05-24 (×3): qty 1

## 2019-05-24 MED ORDER — LIDOCAINE-PRILOCAINE 2.5-2.5 % EX CREA
1.0000 "application " | TOPICAL_CREAM | CUTANEOUS | Status: DC | PRN
  Filled 2019-05-24: qty 5

## 2019-05-24 MED ORDER — INSULIN ASPART 100 UNIT/ML ~~LOC~~ SOLN
0.0000 [IU] | Freq: Three times a day (TID) | SUBCUTANEOUS | Status: DC
  Administered 2019-05-24: 2 [IU] via SUBCUTANEOUS
  Administered 2019-05-24: 3 [IU] via SUBCUTANEOUS
  Administered 2019-05-25: 2 [IU] via SUBCUTANEOUS
  Filled 2019-05-24 (×3): qty 1

## 2019-05-24 MED ORDER — OXYBUTYNIN CHLORIDE ER 5 MG PO TB24
15.0000 mg | ORAL_TABLET | Freq: Every day | ORAL | Status: DC
  Administered 2019-05-24 – 2019-05-26 (×3): 15 mg via ORAL
  Filled 2019-05-24 (×3): qty 1

## 2019-05-24 MED ORDER — MORPHINE SULFATE (PF) 2 MG/ML IV SOLN
INTRAVENOUS | Status: AC
  Filled 2019-05-24: qty 1

## 2019-05-24 MED ORDER — LOSARTAN POTASSIUM 50 MG PO TABS
50.0000 mg | ORAL_TABLET | Freq: Every day | ORAL | Status: DC
  Administered 2019-05-25 – 2019-05-26 (×2): 50 mg via ORAL
  Filled 2019-05-24 (×2): qty 1

## 2019-05-24 MED ORDER — SODIUM CHLORIDE 0.9 % IV SOLN: 100.0000 mL | INTRAVENOUS | Status: DC | PRN

## 2019-05-24 MED ORDER — AMLODIPINE BESYLATE 10 MG PO TABS
10.0000 mg | ORAL_TABLET | Freq: Every day | ORAL | Status: DC
  Administered 2019-05-25 – 2019-05-26 (×2): 10 mg via ORAL
  Filled 2019-05-24 (×3): qty 1

## 2019-05-24 MED ORDER — HYDRALAZINE HCL 25 MG PO TABS
25.0000 mg | ORAL_TABLET | Freq: Three times a day (TID) | ORAL | Status: DC
  Administered 2019-05-24 – 2019-05-26 (×6): 25 mg via ORAL
  Filled 2019-05-24 (×7): qty 1

## 2019-05-24 MED ORDER — CYCLOBENZAPRINE HCL 10 MG PO TABS
10.0000 mg | ORAL_TABLET | Freq: Every day | ORAL | Status: DC | PRN
  Administered 2019-05-24 – 2019-05-25 (×2): 10 mg via ORAL
  Filled 2019-05-24 (×3): qty 1

## 2019-05-24 MED ORDER — ONDANSETRON HCL 4 MG PO TABS: 4.0000 mg | ORAL_TABLET | Freq: Three times a day (TID) | ORAL | Status: DC | PRN

## 2019-05-24 MED ORDER — SODIUM CHLORIDE 0.9 % IV SOLN: 250.0000 mL | INTRAVENOUS | Status: DC | PRN

## 2019-05-24 MED ORDER — EPOETIN ALFA 10000 UNIT/ML IJ SOLN: 4000.0000 [IU] | INTRAMUSCULAR | Status: DC

## 2019-05-24 MED ORDER — PANTOPRAZOLE SODIUM 40 MG PO TBEC
40.0000 mg | DELAYED_RELEASE_TABLET | Freq: Every day | ORAL | Status: DC
  Administered 2019-05-24 – 2019-05-26 (×3): 40 mg via ORAL
  Filled 2019-05-24 (×3): qty 1

## 2019-05-24 MED ORDER — CARVEDILOL 12.5 MG PO TABS
12.5000 mg | ORAL_TABLET | Freq: Two times a day (BID) | ORAL | Status: DC
  Administered 2019-05-24 – 2019-05-26 (×4): 12.5 mg via ORAL
  Filled 2019-05-24 (×3): qty 1
  Filled 2019-05-24: qty 2
  Filled 2019-05-24 (×2): qty 1

## 2019-05-24 MED ORDER — ZOLPIDEM TARTRATE 5 MG PO TABS: 5.0000 mg | ORAL_TABLET | Freq: Every evening | ORAL | Status: DC | PRN

## 2019-05-24 MED ORDER — SODIUM CHLORIDE 0.9% FLUSH
3.0000 mL | Freq: Two times a day (BID) | INTRAVENOUS | Status: DC
  Administered 2019-05-24 – 2019-05-26 (×5): 3 mL via INTRAVENOUS

## 2019-05-24 MED ORDER — HYDROCODONE-ACETAMINOPHEN 7.5-325 MG PO TABS
1.0000 | ORAL_TABLET | Freq: Every day | ORAL | Status: DC | PRN
  Administered 2019-05-24: 1 via ORAL
  Administered 2019-05-25 – 2019-05-26 (×3): 2 via ORAL
  Filled 2019-05-24: qty 1
  Filled 2019-05-24 (×3): qty 2

## 2019-05-24 MED ORDER — LIDOCAINE HCL (PF) 1 % IJ SOLN
5.0000 mL | INTRAMUSCULAR | Status: DC | PRN
  Filled 2019-05-24: qty 5

## 2019-05-24 MED ORDER — HEPARIN SODIUM (PORCINE) 1000 UNIT/ML DIALYSIS: 1000.0000 [IU] | INTRAMUSCULAR | Status: DC | PRN

## 2019-05-24 MED ORDER — ALTEPLASE 2 MG IJ SOLR: 2.0000 mg | Freq: Once | INTRAMUSCULAR | Status: DC | PRN

## 2019-05-24 MED ORDER — ONDANSETRON HCL 4 MG/2ML IJ SOLN: 4.0000 mg | Freq: Four times a day (QID) | INTRAMUSCULAR | Status: DC | PRN

## 2019-05-24 MED ORDER — ACETAMINOPHEN 325 MG PO TABS
650.0000 mg | ORAL_TABLET | ORAL | Status: DC | PRN
  Administered 2019-05-24 – 2019-05-25 (×3): 650 mg via ORAL
  Filled 2019-05-24 (×3): qty 2

## 2019-05-24 NOTE — ED Notes (Signed)
Page placed to dr. Sidney Ace to notify of no urine output and pt complains of central chest pain 7/10.

## 2019-05-24 NOTE — Consult Note (Signed)
Cardiology Consultation:   Patient ID: Phyllis [name] MRN: [id number]; DOB: 07/20/1946  Admit date: 05/23/2019 Date of Consult: 05/24/2019  Primary Care Provider: Wellington Hampshire, MD Primary Cardiologist: Ida Rogue, MD  Primary Electrophysiologist:  None    Patient Profile:   Phyllis [name] is a 73 y.o. 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 Darvin Neighbours  History of Present Illness:   Phyllis [name] is a 73 year old female with the above medical problems.  She is known to have chronic chest pain thought to be due to costochondritis with negative stress testing in the past.  We saw her in June when she was admitted with atypical chest pain.  We did not recommend any further ischemic cardiac work-up.  She is known to have her aortic stenosis and had an echocardiogram done during that admission.  I personally reviewed the echocardiogram which showed normal LV systolic function, moderate aortic stenosis with a mean gradient of 25 mmHg and valve area of 0.98 cm. She has end-stage renal disease on hemodialysis initially that was twice weekly but she started having issues with volume overload and was switched to 3 times per week on Tuesday, Thursday and Saturday.  Her most recent dialysis was on Thursday.  She reports that they took only 1 L of fluid because her dry weight has been decreasing.  She also continues to make some urine and takes furosemide.  She presented with worsening shortness of breath and orthopnea without chest pain.  She was found to be volume overloaded and required BiPAP.  She feels better this morning.  She reports that her blood pressure has  been elevated.  She is getting dialysis today.  High-sensitivity troponin was mildly elevated with most recent 1 being 93.  BNP was 885.  Heart Pathway Score:     Past Medical History:  Diagnosis Date   (HFpEF) heart failure with preserved ejection fraction ([place])    a. TTE 01/2014: nl LV sys fxn, no valvular abnormalities; b. TTE 11/16: nl EF, mild LVH;  c. 04/2019 Echo: EF 60-65%. DD. Nl RV fxn. Mod AS. 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   Allergy    Anemia of chronic disease    Anxiety    Aortic atherosclerosis ([facility])    Asthma    Chronic back pain    COPD (chronic obstructive pulmonary disease) ([place])    Diabetes mellitus with complication ([place])    ESRD on hemodialysis ([place])    a. Tues/Sat; b. 2/2 small kidneys   Essential hypertension    Fistula    lower left arm   GERD (gastroesophageal reflux disease)    Gout    History of exercise stress test    a. 01/2014: no evidence of ischemia; b. Lexiscan 08/2015: no sig ischemia, severe GI uptake artifact, low risk; c. CPET @ [facility] 09/2016: exercised 3 min 12 sec on bike without incline, 2.28 METs, VO2 of 8.1, 48% of predicted, indicating mod to sev functional impairment, evidence of blunted HR, stroke volume, and BP augmentation as well as ventilation-perfusion mismatch with exercise   HLD (hyperlipidemia)    Moderate aortic stenosis    a. 04/2019 Echo: Mod AS.  Non-obstructive Carotid arterial disease ([place])    a. 12/2017: <50% bilat ICA dzs.   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    right chest   Sleep apnea     Past Surgical History:  Procedure Laterality Date   carpel tunnel     GALLBLADDER SURGERY     PERIPHERAL VASCULAR CATHETERIZATION N/A 04/12/2015   Procedure: A/V Shuntogram/Fistulagram;  Surgeon: Algernon Huxley, MD;  Location: [facility];  Service: Cardiovascular;  Laterality: N/A;   PERIPHERAL VASCULAR CATHETERIZATION N/A 04/12/2015   Procedure: A/V Shunt Intervention;   Surgeon: Algernon Huxley, MD;  Location: [facility];  Service: Cardiovascular;  Laterality: N/A;   PERIPHERAL VASCULAR CATHETERIZATION N/A 06/09/2015   Procedure: Dialysis/Perma Catheter Removal;  Surgeon: Katha Cabal, MD;  Location: [facility];  Service: Cardiovascular;  Laterality: N/A;     Home Medications:  Prior to Admission medications   Medication Sig Start Date End Date Taking? Authorizing Provider  albuterol (PROVENTIL HFA;VENTOLIN HFA) 108 (90 Base) MCG/ACT inhaler Inhale 2 puffs into the lungs every 6 (six) hours as needed for wheezing or shortness of breath.   Yes [provider]  albuterol (PROVENTIL) (2.5 MG/3ML) 0.083% nebulizer solution Take 3 mLs (2.5 mg total) by nebulization every 6 (six) hours as needed for wheezing or shortness of breath. 05/04/19  Yes Nance Pear, MD  amLODipine (NORVASC) 10 MG tablet Take 10 mg by mouth.  12/29/13  Yes [provider]  ANECREAM 4 % cream APPLY TOPICALLY 2 (TWO) TIMES DAILY FOR 30 DAYS 05/12/19  Yes [provider]  calcium acetate (PHOSLO) 667 MG capsule Take 667 mg by mouth 3 (three) times daily with meals.    Yes [provider]  carvedilol (COREG) 12.5 MG tablet Take 12.5 mg by mouth 2 (two) times daily with a meal.  01/09/14  Yes [provider]  cetirizine (ZYRTEC) 10 MG tablet Take 10 mg by mouth at bedtime.    Yes [provider]  cyclobenzaprine (FLEXERIL) 10 MG tablet Take 10 mg by mouth daily as needed for muscle spasms.    Yes [provider]  Fluticasone-Salmeterol (ADVAIR) 250-50 MCG/DOSE AEPB Inhale 1 puff into the lungs 2 (two) times daily as needed (for shortness of breath).    Yes [provider]  furosemide (LASIX) 80 MG tablet Take 1 tablet (80 mg total) by mouth daily. 03/17/18  Yes Wieting, Richard, MD  hydrALAZINE (APRESOLINE) 25 MG tablet Take 25 mg by mouth 3 (three) times daily. 03/25/19  Yes [provider]    HYDROcodone-acetaminophen (NORCO) 7.5-325 MG tablet Take 1-2 tablets by mouth daily as needed (pain).  04/09/19  Yes [provider]  lidocaine-prilocaine (EMLA) cream Apply 1 application topically as needed (prior to treatment).    Yes [provider]  losartan (COZAAR) 50 MG tablet Take 50 mg by mouth daily. 03/18/19  Yes [provider]  omeprazole (PRILOSEC) 20 MG capsule Take 20 mg by mouth 2 (two) times daily before a meal.   Yes [provider]  ondansetron (ZOFRAN) 4 MG tablet Take 4 mg by mouth every 8 (eight) hours as needed. 05/20/19  Yes [provider]  oxybutynin (DITROPAN XL) 15 MG 24 hr tablet Take 15 mg by mouth daily. 12/24/17  Yes [provider]  pravastatin (PRAVACHOL) 40 MG tablet Take 40 mg by mouth at bedtime.    Yes [provider]  topiramate (TOPAMAX) 25 MG tablet Take 25 mg by  mouth as needed (for headaches).    Yes [provider]  clopidogrel (PLAVIX) 75 MG tablet Take 1 tablet (75 mg total) by mouth daily. 01/18/18   Epifanio Lesches, MD  predniSONE (DELTASONE) 20 MG tablet Take 40 mg by mouth daily with breakfast.    [provider]    Inpatient Medications: Scheduled Meds:  amLODipine  10 mg Oral Daily   calcium acetate  667 mg Oral TID WC   carvedilol  12.5 mg Oral BID WC   Chlorhexidine Gluconate Cloth  6 each Topical Q0600   clopidogrel  75 mg Oral Daily   furosemide  80 mg Intravenous Q12H   heparin  5,000 Units Subcutaneous Q12H   hydrALAZINE  25 mg Oral TID   insulin aspart  0-9 Units Subcutaneous TID WC   loratadine  10 mg Oral Daily   losartan  50 mg Oral Daily   pantoprazole  40 mg Oral Daily   pravastatin  40 mg Oral QHS   predniSONE  40 mg Oral Q breakfast   sodium chloride flush  3 mL Intravenous Q12H   Continuous Infusions:  sodium chloride     sodium chloride     sodium chloride     PRN Meds: sodium chloride, sodium chloride, sodium  chloride, acetaminophen, albuterol, alteplase, cyclobenzaprine, heparin, HYDROcodone-acetaminophen, lidocaine (PF), lidocaine-prilocaine, ondansetron (ZOFRAN) IV, ondansetron, pentafluoroprop-tetrafluoroeth, sodium chloride flush, topiramate, zolpidem  Allergies:    Allergies  Allergen Reactions   Codeine Nausea And Vomiting and Nausea Only   Enalapril Maleate Other (See Comments)    Other reaction(s): Headache   Nitrofurantoin Swelling and Rash    Other Reaction: swelling of body   Sulfamethoxazole-Trimethoprim Swelling   Vicodin [Hydrocodone-Acetaminophen] Nausea And Vomiting and Nausea Only   2,4-D Dimethylamine (Amisol) Rash and Other (See Comments)    Other Reaction: h/a   Baclofen Other (See Comments) and Nausea Only    lightheadness ,drowsiness , muscle weakness , twitching in hands    Neosporin [Neomycin-Bacitracin Zn-Polymyx] Other (See Comments) and Rash    Other Reaction: irritation Skin irritation    Quinine Nausea And Vomiting, Rash and Other (See Comments)    Other Reaction: Vomiting, rash, h/a, vision   Ultram [Tramadol] Palpitations   Zocor [Simvastatin] Other (See Comments) and Rash    Other Reaction: muscle spasms Muscle pain and spasms   Bactrim [Sulfamethoxazole-Trimethoprim] Swelling   Levodopa Other (See Comments)    Reaction: unknown   Macrodantin [Nitrofurantoin Macrocrystal] Swelling   Quinine Derivatives Other (See Comments)    Vertigo,nausea vomiting blurred vision headache ears sensitivity     Social History:   Social History   Socioeconomic History   Marital status: Single    Spouse name: Not on file   Number of children: Not on file   Years of education: Not on file   Highest education level: Not on file  Occupational History   Not on file  Social Needs   Financial resource strain: Not on file   Food insecurity    Worry: Not on file    Inability: Not on file   Transportation needs    Medical: Not on file     Non-medical: Not on file  Tobacco Use   Smoking status: Never Smoker   Smokeless tobacco: Never Used  Substance and Sexual Activity   Alcohol use: No   Drug use: No   Sexual activity: Not Currently  Lifestyle   Physical activity    Days per week: Not on file  Minutes per session: Not on file   Stress: Not on file  Relationships   Social connections    Talks on phone: Not on file    Gets together: Not on file    Attends religious service: Not on file    Active member of club or organization: Not on file    Attends meetings of clubs or organizations: Not on file    Relationship status: Not on file   Intimate partner violence    Fear of current or ex partner: Not on file    Emotionally abused: Not on file    Physically abused: Not on file    Forced sexual activity: Not on file  Other Topics Concern   Not on file  Social History Narrative   Not on file    Family History:    Family History  Problem Relation Age of Onset   Hypertension Mother    Hyperlipidemia Mother    Heart disease Father    Heart attack Father 64   Hypertension Father    Hyperlipidemia Father    Heart disease Brother        CABG    Heart attack Brother    Breast cancer Neg Hx      ROS:  Please see the history of present illness.   All other ROS reviewed and negative.     Physical Exam/Data:   Vitals:   05/24/19 0730 05/24/19 0800 05/24/19 0832 05/24/19 0900  BP: (!) 128/53  (!) 175/69   Pulse: 70 79 (!) 102 87  Resp: 14 15 19 14   Temp:   98 F (36.7 C)   TempSrc:   Oral   SpO2: 99% 100% 100% 100%  Weight:   56.9 kg   Height:   4\' 10"  (1.473 m)    No intake or output data in the 24 hours ending 05/24/19 1116 Last 3 Weights 05/24/2019 05/23/2019 05/08/2019  Weight (lbs) 125 lb 7.1 oz 126 lb 8.7 oz 126 lb 8.7 oz  Weight (kg) 56.9 kg 57.4 kg 57.4 kg     Body mass index is 26.22 kg/m.  General:  Well nourished, well developed, in no acute distress HEENT:  normal Lymph: no adenopathy Neck: Mild Endocrine:  No thryomegaly Vascular: No carotid bruits; FA pulses 2+ bilaterally  Cardiac:  normal S1, S2; RRR; 3 out of 6 crescendo decrescendo systolic murmur which is mid to late peaking with slightly diminished S2. Lungs: Bibasilar crackles Abd: soft, nontender, no hepatomegaly  Ext: no edema Musculoskeletal:  No deformities, BUE and BLE strength normal and equal Skin: warm and dry  Neuro:  CNs 2-12 intact, no focal abnormalities noted Psych:  Normal affect   EKG:   EKG is not available for review Telemetry:  Telemetry was personally reviewed and demonstrates: Normal sinus rhythm  Relevant CV Studies: I personally reviewed echocardiogram from last month and was summarized above.  Laboratory Data:  High Sensitivity Troponin:   Recent Labs  Lab 05/23/19 2259 05/24/19 0413 05/24/19 0746  TROPONINIHS 30* 79* 93*     Cardiac EnzymesNo results for input(s): TROPONINI in the last 168 hours. No results for input(s): TROPIPOC in the last 168 hours.  Chemistry Recent Labs  Lab 05/23/19 2259 05/24/19 0159  NA 139  --   K 4.0  --   CL 97*  --   CO2 28  --   GLUCOSE 208*  --   BUN 32*  --   CREATININE 5.33* 5.56*  CALCIUM 7.8*  --  GFRNONAA 7* 7*  GFRAA 9* 8*  ANIONGAP 14  --     Recent Labs  Lab 05/23/19 2259  PROT 6.8  ALBUMIN 4.2  AST 17  ALT 12  ALKPHOS 86  BILITOT 0.7   Hematology Recent Labs  Lab 05/23/19 2259 05/24/19 0159 05/24/19 0413  WBC 14.7* 12.2* 9.5  RBC 3.31* 3.26* 3.24*  HGB 9.8* 9.7* 9.6*  HCT 30.8* 30.1* 30.0*  MCV 93.1 92.3 92.6  MCH 29.6 29.8 29.6  MCHC 31.8 32.2 32.0  RDW 14.0 13.7 13.6  PLT 182 125* 125*   BNP Recent Labs  Lab 05/23/19 2300  BNP 895.0*    DDimer No results for input(s): DDIMER in the last 168 hours.   Radiology/Studies:  Dg Chest Port 1 View  Result Date: 05/23/2019 CLINICAL DATA:  Shortness of breath. EXAM: PORTABLE CHEST 1 VIEW COMPARISON:  05/07/2019 FINDINGS:  Unchanged cardiomegaly and mediastinal contours. Tortuosity and atherosclerosis of the thoracic aorta. Worsening interstitial and peribronchial thickening consistent with pulmonary edema. No definite pleural fluid or focal airspace disease. Overlying support apparatus partially obscure the left chest. IMPRESSION: Worsening pulmonary edema with grossly unchanged cardiomegaly. Electronically Signed   By: Keith Rake M.D.   On: 05/23/2019 23:24    Assessment and Plan:   1. Acute on chronic diastolic heart failure: The patient does have grade 2 diastolic dysfunction on most recent echocardiogram and I suspect she requires more fluid removal during dialysis.  I discussed this with Dr. Holley Raring.  I do not think that her current episode was triggered by an ischemic cardiac event.  I also do not think her aortic stenosis is severe based on most recent echocardiogram from last month.  However, if she continues to have these episodes, I think the best option is to proceed with right and left cardiac catheterization in the near future to fully evaluate this.  2.  Aortic stenosis: Likely in the moderate range although low flow low gradient severe aortic stenosis cannot be completely excluded.  Continue close monitoring of this with serial echocardiograms.  Next echocardiogram should be done in December or as I mentioned above if she continues to have recurrent episodes of heart failure, this should be fully evaluated with a right and left cardiac catheterization before then.  3.  Mildly elevated troponin: Likely supply demand ischemia.  No chest pain.  No plans for ischemic cardiac work-up at the present time.  4.  Essential hypertension: Blood pressure is elevated.  Should consider increasing the dose of carvedilol.      For questions or updates, please contact [place] Please consult [email or web address] for contact info under     Signed, Kathlyn Sacramento, MD  05/24/2019 11:16 AM

## 2019-05-24 NOTE — ED Notes (Signed)
Pt sleeping in room at this time and in NAD.

## 2019-05-24 NOTE — Progress Notes (Signed)
[facility]  ROUNDING NOTE   Subjective:  Patient well-known to [place]. Follow her for outpatient hemodialysis. Came in with shortness of breath. Due for her usual dialysis today.    Objective:  Vital signs in last 24 hours:  Temp:  [98 F (36.7 C)-98.2 F (36.8 C)] 98.2 F (36.8 C) (07/04 0930) Pulse Rate:  [66-106] 85 (07/04 1230) Resp:  [11-31] 17 (07/04 1230) BP: (120-175)/(49-69) 158/67 (07/04 1230) SpO2:  [97 %-100 %] 100 % (07/04 1230) Weight:  [56.9 kg-57.4 kg] 56.9 kg (07/04 0930)  Weight change:  Filed Weights   05/23/19 2302 05/24/19 0832 05/24/19 0930  Weight: 57.4 kg 56.9 kg 56.9 kg    Intake/Output: No intake/output data recorded.   Intake/Output this shift:  Total I/O In: -  Out: 1154 [Other:1154]  Physical Exam: General: No acute distress  Head: Normocephalic, atraumatic. Moist oral mucosal membranes  Eyes: Anicteric  Neck: Supple, trachea midline  Lungs:  Basilar rales, normal effort  Heart: S1S2 no rubs  Abdomen:  Soft, nontender, bowel sounds present  Extremities: No peripheral edema.  Neurologic: Awake, alert, following commands  Skin: No lesions  Access: left upper extremity AV fistula    Basic Metabolic Panel: Recent Labs  Lab 05/23/19 2259 05/24/19 0159 05/24/19 0413  NA 139  --   --   K 4.0  --   --   CL 97*  --   --   CO2 28  --   --   GLUCOSE 208*  --   --   BUN 32*  --   --   CREATININE 5.33* 5.56*  --   CALCIUM 7.8*  --   --   PHOS  --   --  3.9    Liver Function Tests: Recent Labs  Lab 05/23/19 2259  AST 17  ALT 12  ALKPHOS 86  BILITOT 0.7  PROT 6.8  ALBUMIN 4.2   No results for input(s): LIPASE, AMYLASE in the last 168 hours. No results for input(s): AMMONIA in the last 168 hours.  CBC: Recent Labs  Lab 05/23/19 2259 05/24/19 0159 05/24/19 0413  WBC 14.7* 12.2* 9.5  NEUTROABS 12.4*  --  8.8*  HGB 9.8* 9.7* 9.6*  HCT 30.8* 30.1* 30.0*  MCV 93.1 92.3 92.6  PLT 182 125* 125*    Cardiac  Enzymes: No results for input(s): CKTOTAL, CKMB, CKMBINDEX, TROPONINI in the last 168 hours.  BNP: Invalid input(s): POCBNP  CBG: Recent Labs  Lab 05/24/19 0749 05/24/19 0835  GLUCAP 159* 170*    Microbiology: Results for orders placed or performed during the hospital encounter of 05/23/19  SARS Coronavirus 2 (CEPHEID - Performed in [place] hospital lab), Hosp Order     Status: None   Collection Time: 05/23/19 11:00 PM   Specimen: Nasopharyngeal Swab  Result Value Ref Range Status   SARS Coronavirus 2 NEGATIVE NEGATIVE Final    Comment: (NOTE) If result is NEGATIVE SARS-CoV-2 target nucleic acids are NOT DETECTED. The SARS-CoV-2 RNA is generally detectable in upper and lower  respiratory specimens during the acute phase of infection. The lowest  concentration of SARS-CoV-2 viral copies this assay can detect is 250  copies / mL. A negative result does not preclude SARS-CoV-2 infection  and should not be used as the sole basis for treatment or other  patient management decisions.  A negative result may occur with  improper specimen collection / handling, submission of specimen other  than nasopharyngeal swab, presence of viral mutation(s) within  the  areas targeted by this assay, and inadequate number of viral copies  (<250 copies / mL). A negative result must be combined with clinical  observations, patient history, and epidemiological information. If result is POSITIVE SARS-CoV-2 target nucleic acids are DETECTED. The SARS-CoV-2 RNA is generally detectable in upper and lower  respiratory specimens dur ing the acute phase of infection.  Positive  results are indicative of active infection with SARS-CoV-2.  Clinical  correlation with patient history and other diagnostic information is  necessary to determine patient infection status.  Positive results do  not rule out bacterial infection or co-infection with other viruses. If result is PRESUMPTIVE POSTIVE SARS-CoV-2  nucleic acids MAY BE PRESENT.   A presumptive positive result was obtained on the submitted specimen  and confirmed on repeat testing.  While 2019 novel coronavirus  (SARS-CoV-2) nucleic acids may be present in the submitted sample  additional confirmatory testing may be necessary for epidemiological  and / or clinical management purposes  to differentiate between  SARS-CoV-2 and other Sarbecovirus currently known to infect humans.  If clinically indicated additional testing with an alternate test  methodology [phone]) is advised. The SARS-CoV-2 RNA is generally  detectable in upper and lower respiratory sp ecimens during the acute  phase of infection. The expected result is Negative. Fact Sheet for Patients:  StrictlyIdeas.no Fact Sheet for Healthcare Providers: BankingDealers.co.za This test is not yet approved or cleared by the [place] FDA and has been authorized for detection and/or diagnosis of SARS-CoV-2 by FDA under an Emergency Use Authorization (EUA).  This EUA will remain in effect (meaning this test can be used) for the duration of the COVID-19 declaration under Section 564(b)(1) of the Act, 21 U.S.C. section 360bbb-3(b)(1), unless the authorization is terminated or revoked sooner. Performed at [facility], [address] [place], [place] [address]   Blood culture (routine x 2)     Status: None (Preliminary result)   Collection Time: 05/23/19 11:00 PM   Specimen: BLOOD  Result Value Ref Range Status   Specimen Description BLOOD RIGHT ASSIST CONTROL  Final   Special Requests   Final    BOTTLES DRAWN AEROBIC AND ANAEROBIC Blood Culture adequate volume   Culture   Final    NO GROWTH < 12 HOURS Performed at [facility], [address], [place], [place] [address]    Report Status PENDING  Incomplete  Blood culture (routine x 2)     Status: None (Preliminary result)   Collection Time: 05/23/19  11:01 PM   Specimen: BLOOD  Result Value Ref Range Status   Specimen Description BLOOD RIGHT HAND  Final   Special Requests   Final    BOTTLES DRAWN AEROBIC AND ANAEROBIC Blood Culture results may not be optimal due to an excessive volume of blood received in culture bottles   Culture   Final    NO GROWTH < 12 HOURS Performed at [facility], [address] [place],  [address]    Report Status PENDING  Incomplete    Coagulation Studies: No results for input(s): LABPROT, INR in the last 72 hours.  Urinalysis: No results for input(s): COLORURINE, LABSPEC, PHURINE, GLUCOSEU, HGBUR, BILIRUBINUR, KETONESUR, PROTEINUR, UROBILINOGEN, NITRITE, LEUKOCYTESUR in the last 72 hours.  Invalid input(s): APPERANCEUR    Imaging: Dg Chest Port 1 View  Result Date: 05/23/2019 CLINICAL DATA:  Shortness of breath. EXAM: PORTABLE CHEST 1 VIEW COMPARISON:  05/07/2019 FINDINGS: Unchanged cardiomegaly and mediastinal contours. Tortuosity and atherosclerosis of  the thoracic aorta. Worsening interstitial and peribronchial thickening consistent with pulmonary edema. No definite pleural fluid or focal airspace disease. Overlying support apparatus partially obscure the left chest. IMPRESSION: Worsening pulmonary edema with grossly unchanged cardiomegaly. Electronically Signed   By: Keith Rake M.D.   On: 05/23/2019 23:24     Medications:   . sodium chloride    . sodium chloride    . sodium chloride     . amLODipine  10 mg Oral Daily  . calcium acetate  667 mg Oral TID WC  . carvedilol  12.5 mg Oral BID WC  . Chlorhexidine Gluconate Cloth  6 each Topical Q0600  . clopidogrel  75 mg Oral Daily  . furosemide  80 mg Intravenous Q12H  . heparin  5,000 Units Subcutaneous Q12H  . hydrALAZINE  25 mg Oral TID  . insulin aspart  0-9 Units Subcutaneous TID WC  . loratadine  10 mg Oral Daily  . losartan  50 mg Oral Daily  . pantoprazole  40 mg Oral Daily  . pravastatin  40 mg Oral QHS  .  predniSONE  40 mg Oral Q breakfast  . sodium chloride flush  3 mL Intravenous Q12H   sodium chloride, sodium chloride, sodium chloride, acetaminophen, albuterol, alteplase, cyclobenzaprine, heparin, HYDROcodone-acetaminophen, lidocaine (PF), lidocaine-prilocaine, ondansetron (ZOFRAN) IV, ondansetron, pentafluoroprop-tetrafluoroeth, sodium chloride flush, topiramate, zolpidem  Assessment/ Plan:  73 y.o. female with end stage renal disease on hemodialysis, hypertension, diabetes mellitus type II, overactive bladder, gout, COPD, congestive heart failure, coronary artery disease  [facility] T[facility] [address] 58.5kg Left AVF  1. End Stage Renal Disease: on hemodialysis.  Patient seen and evaluated during hemodialysis.  Tolerating well.  We plan to complete dialysis treatment today.  2. Hypertension:  Maintain the patient on amlodipine, carvedilol, hydralazine, losartan.  3. Anemia with chronic kidney disease:   -Hemoglobin 9.6.  Start the patient on Epogen.  4. Secondary Hyperparathyroidism: Continue to monitor bone mineral metabolism parameters over the course of the hospitalization.   LOS: 0 Zo [place] 7/4/202012:44 PM

## 2019-05-24 NOTE — ED Notes (Addendum)
ED TO INPATIENT HANDOFF REPORT  ED Nurse Name and Phone #:  [facility] [place] Name/Age/Gender Phyllis [name] 73 y.o. female Room/Bed: ED16A/ED16A  Code Status   Code Status: Full Code  Home/SNF/Other Home Patient oriented to: self, place, time and situation Is this baseline? Yes   Triage Complete: Triage complete  Chief Complaint resp distress  Triage Note Pt comes via [facility] from home with c/o SOB. Pt recently seen here for same. EMS reports when they arrived pt was in low 80s and then placed on nonrebreather and increased to upper 80s. EMS placed pt on CPAP. Pt given 2 duonebs, 2 nitro sprays and 1 1/2 inch nitro paste, 125 solumedrol.  BP-198/95 before nitro then 133/59. Pt arrives on CPAP and alert and oriented.  Respiratory at bedside, pt placed on BIPAP.  Pt has hx of COPD and CHF.   Allergies Allergies  Allergen Reactions  . Codeine Nausea And Vomiting and Nausea Only  . Enalapril Maleate Other (See Comments)    Other reaction(s): Headache  . Nitrofurantoin Swelling and Rash    Other Reaction: swelling of body  . Sulfamethoxazole-Trimethoprim Swelling  . Vicodin [Hydrocodone-Acetaminophen] Nausea And Vomiting and Nausea Only  . 2,4-D Dimethylamine (Amisol) Rash and Other (See Comments)    Other Reaction: h/a  . Baclofen Other (See Comments) and Nausea Only    lightheadness ,drowsiness , muscle weakness , twitching in hands   . Neosporin [Neomycin-Bacitracin Zn-Polymyx] Other (See Comments) and Rash    Other Reaction: irritation Skin irritation   . Quinine Nausea And Vomiting, Rash and Other (See Comments)    Other Reaction: Vomiting, rash, h/a, vision  . Ultram [Tramadol] Palpitations  . Zocor [Simvastatin] Other (See Comments) and Rash    Other Reaction: muscle spasms Muscle pain and spasms  . Bactrim [Sulfamethoxazole-Trimethoprim] Swelling  . Levodopa Other (See Comments)    Reaction: unknown  . Macrodantin [Nitrofurantoin Macrocrystal]  Swelling  . Quinine Derivatives Other (See Comments)    Vertigo,nausea vomiting blurred vision headache ears sensitivity     Level of Care/Admitting Diagnosis ED Disposition    ED Disposition Condition [place]: [place] [[id number]]  Level of Care: Stepdown [14]  Covid Evaluation: Confirmed COVID Negative  Diagnosis: Acute CHF (congestive heart failure) [facility]) [[id number]]  Admitting Physician: Christel Mormon [[id number]]  Attending Physician: Christel Mormon [[id number]]  Estimated length of stay: past midnight tomorrow  Certification:: I certify this patient will need inpatient services for at least 2 midnights  PT Class (Do Not Modify): Inpatient [101]  PT Acc Code (Do Not Modify): Private [1]       B Medical/Surgery History Past Medical History:  Diagnosis Date  . (HFpEF) heart failure with preserved ejection fraction ([place])    a. TTE 01/2014: nl LV sys fxn, no valvular abnormalities; b. TTE 11/16: nl EF, mild LVH;  c. 04/2019 Echo: EF 60-65%. DD. Nl RV fxn. Mod AS. 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  . Allergy   . Anemia of chronic disease   . Anxiety   . Aortic atherosclerosis ([place])   . Asthma   . Chronic back pain   . COPD (chronic obstructive pulmonary disease) ([place])   . Diabetes mellitus with complication ([place])   . ESRD on hemodialysis ([place])    a. Tues/Sat; b. 2/2 small kidneys  . Essential hypertension   . Fistula    lower left arm  . GERD (gastroesophageal reflux disease)   .  Gout   . History of exercise stress test    a. 01/2014: no evidence of ischemia; b. Lexiscan 08/2015: no sig ischemia, severe GI uptake artifact, low risk; c. CPET @ [facility] 09/2016: exercised 3 min 12 sec on bike without incline, 2.28 METs, VO2 of 8.1, 48% of predicted, indicating mod to sev functional impairment, evidence of blunted HR, stroke volume, and BP augmentation as well as ventilation-perfusion mismatch with exercise  . HLD (hyperlipidemia)    . Moderate aortic stenosis    a. 04/2019 Echo: Mod AS.  . Non-obstructive Carotid arterial disease ([place])    a. 12/2017: <50% bilat ICA dzs.  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 Sleep apnea    Past Surgical History:  Procedure Laterality Date  . carpel tunnel    . GALLBLADDER SURGERY    . PERIPHERAL VASCULAR CATHETERIZATION N/A 04/12/2015   Procedure: A/V Shuntogram/Fistulagram;  Surgeon: Algernon Huxley, MD;  Location: [facility];  Service: Cardiovascular;  Laterality: N/A;  . PERIPHERAL VASCULAR CATHETERIZATION N/A 04/12/2015   Procedure: A/V Shunt Intervention;  Surgeon: Algernon Huxley, MD;  Location: [facility];  Service: Cardiovascular;  Laterality: N/A;  . PERIPHERAL VASCULAR CATHETERIZATION N/A 06/09/2015   Procedure: Dialysis/Perma Catheter Removal;  Surgeon: Katha Cabal, MD;  Location: [facility];  Service: Cardiovascular;  Laterality: N/A;     A IV Location/Drains/Wounds Patient Lines/Drains/Airways Status   Active Line/Drains/Airways    Name:   Placement date:   Placement time:   Site:   Days:   Peripheral IV 05/23/19 Right Hand   05/23/19    2258    Hand   1   Peripheral IV 05/23/19 Right Arm   05/23/19    2302    Arm   1   Fistula / Graft Left Forearm Arteriovenous fistula   -    -    Forearm             Intake/Output Last 24 hours No intake or output data in the 24 hours ending 05/24/19 0758  Labs/Imaging Results for orders placed or performed during the hospital encounter of 05/23/19 (from the past 48 hour(s))  CBC with Differential     Status: Abnormal   Collection Time: 05/23/19 10:59 PM  Result Value Ref Range   WBC 14.7 (H) 4.0 - 10.5 K/uL   RBC 3.31 (L) 3.87 - 5.11 MIL/uL   Hemoglobin 9.8 (L) 12.0 - 15.0 g/dL   HCT 30.8 (L) 36.0 - 46.0 %   MCV 93.1 80.0 - 100.0 fL   MCH 29.6 26.0 - 34.0 pg   MCHC 31.8 30.0 - 36.0 g/dL   RDW 14.0 11.5 - 15.5 %   Platelets 182 150 - 400 K/uL   nRBC 0.0 0.0 - 0.2 %    Neutrophils Relative % 84 %   Neutro Abs 12.4 (H) 1.7 - 7.7 K/uL   Lymphocytes Relative 8 %   Lymphs Abs 1.1 0.7 - 4.0 K/uL   Monocytes Relative 5 %   Monocytes Absolute 0.7 0.1 - 1.0 K/uL   Eosinophils Relative 2 %   Eosinophils Absolute 0.3 0.0 - 0.5 K/uL   Basophils Relative 0 %   Basophils Absolute 0.0 0.0 - 0.1 K/uL   Immature Granulocytes 1 %   Abs Immature Granulocytes 0.07 0.00 - 0.07 K/uL    Comment: Performed at [facility], [address], [place], [place] [address]  Comprehensive metabolic  panel     Status: Abnormal   Collection Time: 05/23/19 10:59 PM  Result Value Ref Range   Sodium 139 135 - 145 mmol/L   Potassium 4.0 3.5 - 5.1 mmol/L   Chloride 97 (L) 98 - 111 mmol/L   CO2 28 22 - 32 mmol/L   Glucose, Bld 208 (H) 70 - 99 mg/dL   BUN 32 (H) 8 - 23 mg/dL   Creatinine, Ser 5.33 (H) 0.44 - 1.00 mg/dL   Calcium 7.8 (L) 8.9 - 10.3 mg/dL   Total Protein 6.8 6.5 - 8.1 g/dL   Albumin 4.2 3.5 - 5.0 g/dL   AST 17 15 - 41 U/L   ALT 12 0 - 44 U/L   Alkaline Phosphatase 86 38 - 126 U/L   Total Bilirubin 0.7 0.3 - 1.2 mg/dL   GFR calc non Af Amer 7 (L) >60 mL/min   GFR calc Af Amer 9 (L) >60 mL/min   Anion gap 14 5 - 15    Comment: Performed at [facility], [place], [place] [address]  Troponin I (High Sensitivity)     Status: Abnormal   Collection Time: 05/23/19 10:59 PM  Result Value Ref Range   Troponin I (High Sensitivity) 30 (H) <18 ng/L    Comment: (NOTE) Elevated high sensitivity troponin I (hsTnI) values and significant  changes across serial measurements may suggest ACS but many other  chronic and acute conditions are known to elevate hsTnI results.  Refer to the "Links" section for chest pain algorithms and additional  guidance. Performed at [facility], [address] [place], [place] [address]   SARS Coronavirus 2 (CEPHEID - Performed in [facility] hospital lab), Hosp Order     Status: None   Collection  Time: 05/23/19 11:00 PM   Specimen: Nasopharyngeal Swab  Result Value Ref Range   SARS Coronavirus 2 NEGATIVE NEGATIVE    Comment: (NOTE) If result is NEGATIVE SARS-CoV-2 target nucleic acids are NOT DETECTED. The SARS-CoV-2 RNA is generally detectable in upper and lower  respiratory specimens during the acute phase of infection. The lowest  concentration of SARS-CoV-2 viral copies this assay can detect is 250  copies / mL. A negative result does not preclude SARS-CoV-2 infection  and should not be used as the sole basis for treatment or other  patient management decisions.  A negative result may occur with  improper specimen collection / handling, submission of specimen other  than nasopharyngeal swab, presence of viral mutation(s) within the  areas targeted by this assay, and inadequate number of viral copies  (<250 copies / mL). A negative result must be combined with clinical  observations, patient history, and epidemiological information. If result is POSITIVE SARS-CoV-2 target nucleic acids are DETECTED. The SARS-CoV-2 RNA is generally detectable in upper and lower  respiratory specimens dur ing the acute phase of infection.  Positive  results are indicative of active infection with SARS-CoV-2.  Clinical  correlation with patient history and other diagnostic information is  necessary to determine patient infection status.  Positive results do  not rule out bacterial infection or co-infection with other viruses. If result is PRESUMPTIVE POSTIVE SARS-CoV-2 nucleic acids MAY BE PRESENT.   A presumptive positive result was obtained on the submitted specimen  and confirmed on repeat testing.  While 2019 novel coronavirus  (SARS-CoV-2) nucleic acids may be present in the submitted sample  additional confirmatory testing may be necessary for epidemiological  and / or clinical management  purposes  to differentiate between  SARS-CoV-2 and other Sarbecovirus currently known to infect  humans.  If clinically indicated additional testing with an alternate test  methodology [phone]) is advised. The SARS-CoV-2 RNA is generally  detectable in upper and lower respiratory sp ecimens during the acute  phase of infection. The expected result is Negative. Fact Sheet for Patients:  StrictlyIdeas.no Fact Sheet for Healthcare Providers: BankingDealers.co.za This test is not yet approved or cleared by the [place] FDA and has been authorized for detection and/or diagnosis of SARS-CoV-2 by FDA under an Emergency Use Authorization (EUA).  This EUA will remain in effect (meaning this test can be used) for the duration of the COVID-19 declaration under Section 564(b)(1) of the Act, 21 U.S.C. section 360bbb-3(b)(1), unless the authorization is terminated or revoked sooner. Performed at [facility], [address] [place], [place] [address]   Brain natriuretic peptide     Status: Abnormal   Collection Time: 05/23/19 11:00 PM  Result Value Ref Range   B Natriuretic Peptide 895.0 (H) 0.0 - 100.0 pg/mL    Comment: Performed at [facility], [address] [place], [place] [address]  Blood culture (routine x 2)     Status: None (Preliminary result)   Collection Time: 05/23/19 11:00 PM   Specimen: BLOOD  Result Value Ref Range   Specimen Description BLOOD RIGHT ASSIST CONTROL    Special Requests      BOTTLES DRAWN AEROBIC AND ANAEROBIC Blood Culture adequate volume   Culture      NO GROWTH < 12 HOURS Performed at [facility], [address], [place], [place] [address]    Report Status PENDING   Lactic acid, plasma     Status: None   Collection Time: 05/23/19 11:00 PM  Result Value Ref Range   Lactic Acid, Venous 1.7 0.5 - 1.9 mmol/L    Comment: Performed at [facility], [address] [place], [place] [address]  Blood culture (routine x 2)     Status: None (Preliminary result)    Collection Time: 05/23/19 11:01 PM   Specimen: BLOOD  Result Value Ref Range   Specimen Description BLOOD RIGHT HAND    Special Requests      BOTTLES DRAWN AEROBIC AND ANAEROBIC Blood Culture results may not be optimal due to an excessive volume of blood received in culture bottles   Culture      NO GROWTH < 12 HOURS Performed at [facility], [address] [place], [place] [address]    Report Status PENDING   Blood gas, arterial     Status: Abnormal   Collection Time: 05/23/19 11:10 PM  Result Value Ref Range   FIO2 0.32    Delivery systems BILEVEL POSITIVE AIRWAY PRESSURE    LHR 12 resp/min   Inspiratory PAP 16    Expiratory PAP 8    pH, Arterial 7.45 7.350 - 7.450   pCO2 arterial 45 32.0 - 48.0 mmHg   pO2, Arterial 98 83.0 - 108.0 mmHg   Bicarbonate 31.3 (H) 20.0 - 28.0 mmol/L   Acid-Base Excess 6.4 (H) 0.0 - 2.0 mmol/L   O2 Saturation 97.9 %   Patient temperature 37.0    Collection site RIGHT RADIAL    Sample type ARTERIAL DRAW    Allens test (pass/fail) PASS PASS    Comment: Performed at [facility], [address] [place], [place] [address]  CBC     Status: Abnormal   Collection Time: 05/24/19  1:59 AM  Result Value Ref Range   WBC 12.2 (H) 4.0 - 10.5 K/uL   RBC 3.26 (L) 3.87 - 5.11 MIL/uL   Hemoglobin 9.7 (L) 12.0 - 15.0 g/dL   HCT 30.1 (L) 36.0 - 46.0 %   MCV 92.3 80.0 - 100.0 fL   MCH 29.8 26.0 - 34.0 pg   MCHC 32.2 30.0 - 36.0 g/dL   RDW 13.7 11.5 - 15.5 %   Platelets 125 (L) 150 - 400 K/uL   nRBC 0.0 0.0 - 0.2 %    Comment: Performed at [facility], [address] [place], [place] [address]  Creatinine, serum     Status: Abnormal   Collection Time: 05/24/19  1:59 AM  Result Value Ref Range   Creatinine, Ser 5.56 (H) 0.44 - 1.00 mg/dL   GFR calc non Af Amer 7 (L) >60 mL/min   GFR calc Af Amer 8 (L) >60 mL/min    Comment: Performed at [facility], [address] [place], [place] [address]  CBC WITH  DIFFERENTIAL     Status: Abnormal   Collection Time: 05/24/19  4:13 AM  Result Value Ref Range   WBC 9.5 4.0 - 10.5 K/uL   RBC 3.24 (L) 3.87 - 5.11 MIL/uL   Hemoglobin 9.6 (L) 12.0 - 15.0 g/dL   HCT 30.0 (L) 36.0 - 46.0 %   MCV 92.6 80.0 - 100.0 fL   MCH 29.6 26.0 - 34.0 pg   MCHC 32.0 30.0 - 36.0 g/dL   RDW 13.6 11.5 - 15.5 %   Platelets 125 (L) 150 - 400 K/uL   nRBC 0.0 0.0 - 0.2 %   Neutrophils Relative % 93 %   Neutro Abs 8.8 (H) 1.7 - 7.7 K/uL   Lymphocytes Relative 4 %   Lymphs Abs 0.4 (L) 0.7 - 4.0 K/uL   Monocytes Relative 2 %   Monocytes Absolute 0.2 0.1 - 1.0 K/uL   Eosinophils Relative 0 %   Eosinophils Absolute 0.0 0.0 - 0.5 K/uL   Basophils Relative 0 %   Basophils Absolute 0.0 0.0 - 0.1 K/uL   Immature Granulocytes 1 %   Abs Immature Granulocytes 0.06 0.00 - 0.07 K/uL    Comment: Performed at [facility], [place], [place] [address]  Troponin I (High Sensitivity)     Status: Abnormal   Collection Time: 05/24/19  4:13 AM  Result Value Ref Range   Troponin I (High Sensitivity) 79 (H) <18 ng/L    Comment: READ BACK AND VERIFIED WITH APRIL BRUMGARD AT 0451 ON 05/24/19 RWW (NOTE) Elevated high sensitivity troponin I (hsTnI) values and significant  changes across serial measurements may suggest ACS but many other  chronic and acute conditions are known to elevate hsTnI results.  Refer to the "Links" section for chest pain algorithms and additional  guidance. Performed at [facility], [address] [place], [place] [address]   Glucose, capillary     Status: Abnormal   Collection Time: 05/24/19  7:49 AM  Result Value Ref Range   Glucose-Capillary 159 (H) 70 - 99 mg/dL   Dg Chest Port 1 View  Result Date: 05/23/2019 CLINICAL DATA:  Shortness of breath. EXAM: PORTABLE CHEST 1 VIEW COMPARISON:  05/07/2019 FINDINGS: Unchanged cardiomegaly and mediastinal contours. Tortuosity and atherosclerosis of the thoracic aorta. Worsening  interstitial and peribronchial thickening consistent with pulmonary edema. No definite pleural fluid or focal airspace disease. Overlying support apparatus partially obscure the left chest. IMPRESSION:  Worsening pulmonary edema with grossly unchanged cardiomegaly. Electronically Signed   By: Keith Rake M.D.   On: 05/23/2019 23:24    Pending Labs Unresulted Labs (From admission, onward)    Start     Ordered   05/25/19 3875  Basic metabolic panel  Daily,   STAT     05/24/19 0134   05/24/19 0508  Troponin I (High Sensitivity)  STAT Now then every 2 hours,   STAT    Question:  Indication  Answer:  Suspect ACS   05/24/19 0309          Vitals/Pain Today's Vitals   05/24/19 0615 05/24/19 0630 05/24/19 0700 05/24/19 0730  BP:  (!) 138/54 (!) 131/51 (!) 128/53  Pulse: 70 66 68 70  Resp: 15 11 13 14   Temp:      SpO2: 100% 100% 100% 99%  Weight:      Height:      PainSc:        Isolation Precautions No active isolations  Medications Medications  HYDROcodone-acetaminophen (NORCO) 7.5-325 MG per tablet 1-2 tablet (has no administration in time range)  amLODipine (NORVASC) tablet 10 mg (has no administration in time range)  carvedilol (COREG) tablet 12.5 mg (has no administration in time range)  hydrALAZINE (APRESOLINE) tablet 25 mg (has no administration in time range)  losartan (COZAAR) tablet 50 mg (has no administration in time range)  pravastatin (PRAVACHOL) tablet 40 mg (has no administration in time range)  predniSONE (DELTASONE) tablet 40 mg (has no administration in time range)  calcium acetate (PHOSLO) capsule 667 mg (has no administration in time range)  pantoprazole (PROTONIX) EC tablet 40 mg (has no administration in time range)  ondansetron (ZOFRAN) tablet 4 mg (has no administration in time range)  clopidogrel (PLAVIX) tablet 75 mg (has no administration in time range)  cyclobenzaprine (FLEXERIL) tablet 10 mg (has no administration in time range)  topiramate  (TOPAMAX) tablet 25 mg (has no administration in time range)  albuterol (PROVENTIL) (2.5 MG/3ML) 0.083% nebulizer solution 2.5 mg (has no administration in time range)  loratadine (CLARITIN) tablet 10 mg (has no administration in time range)  sodium chloride flush (NS) 0.9 % injection 3 mL (3 mLs Intravenous Not Given 05/24/19 0143)  sodium chloride flush (NS) 0.9 % injection 3 mL (has no administration in time range)  0.9 %  sodium chloride infusion (has no administration in time range)  acetaminophen (TYLENOL) tablet 650 mg (has no administration in time range)  ondansetron (ZOFRAN) injection 4 mg (has no administration in time range)  heparin injection 5,000 Units (5,000 Units Subcutaneous Given 05/24/19 0247)  furosemide (LASIX) injection 80 mg (0 mg Intravenous Hold 05/24/19 0152)  zolpidem (AMBIEN) tablet 5 mg (has no administration in time range)  insulin aspart (novoLOG) injection 0-9 Units (2 Units Subcutaneous Given 05/24/19 0755)  furosemide (LASIX) injection 80 mg (80 mg Intravenous Given 05/24/19 0020)  morphine 2 MG/ML injection 2 mg (2 mg Intravenous Given 05/24/19 0410)    Mobility walks with person assist Low fall risk   Focused Assessments Pulmonary Assessment Handoff:  Lung sounds: Bilateral Breath Sounds: Coarse crackles O2 Device: Bi-PAP   Pt is on 28% O2 Bipap settings IPAP 16, EPAP8     R Recommendations: See Admitting Provider Note  Report given to:   Additional Notes:

## 2019-05-24 NOTE — Progress Notes (Signed)
Pre HD Tx   05/24/19 0930  Hand-Off documentation  Report given to (Full Name) Trellis Paganini RN  Report received from (Full Name) [facility] RN ICU  Vital Signs  Temp 98.2 F (36.8 C)  Temp Source Oral  Pulse Rate 85  Pulse Rate Source Monitor  Resp 13  BP (!) 152/62  BP Location Right Arm  BP Method Automatic  Patient Position (if appropriate) Lying  Oxygen Therapy  SpO2 100 %  O2 Device Nasal Cannula  O2 Flow Rate (L/min) 2 L/min  Pain Assessment  Pain Scale 0-10  Pain Score 0  Dialysis Weight  Weight 56.9 kg  Type of Weight Pre-Dialysis  Time-Out for Hemodialysis  What Procedure? Hemodialysis  Pt Identifiers(min of two) First/Last Name;MRN/Account#  Correct Site? Yes  Correct Side? Yes  Correct Procedure? Yes  Consents Verified? Yes  Rad Studies Available? N/A  Safety Precautions Reviewed? Yes  Engineer, civil (consulting) Number 2  Station Number 1  UF/Alarm Test Passed  Conductivity: Meter 14  Conductivity: Machine  13.8  pH 7  Reverse Osmosis Main  Normal Saline Lot Number [id number]  Dialyzer Lot Number [id number]  Disposable Set Lot Number [id number]  Machine Temperature 98.6 F (37 C)  Musician and Audible Yes  Blood Lines Intact and Secured Yes  Pre Treatment Patient Checks  Vascular access used during treatment Fistula  Hepatitis B Surface Antigen Results Negative  Date Hepatitis B Surface Antigen Drawn 11/29/18  Hepatitis B Surface Antibody 83.7  Date Hepatitis B Surface Antibody Drawn 11/29/18  Hemodialysis Consent Verified Yes  Hemodialysis Standing Orders Initiated Yes  ECG (Telemetry) Monitor On Yes  Prime Ordered Normal Saline  Length of  DialysisTreatment -hour(s) 2.75 Hour(s)  Dialysis Treatment Comments Na 140  Dialyzer Elisio 17H NR  Dialysate 3K;2.5 Ca (changed to3 from 2 at tx start per MD)  Dialysate Flow Ordered 600  Blood Flow Rate Ordered 350 mL/min  Ultrafiltration Goal 2.5 Liters  Dialysis Blood Pressure Support Ordered Normal  Saline  Education / Care Plan  Dialysis Education Provided Yes  Documented Education in Care Plan Yes  Fistula / Graft Left Forearm Arteriovenous fistula  No Placement Date or Time found.   Placed prior to admission: Yes  Orientation: Left  Access Location: Forearm  Access Type: Arteriovenous fistula  Site Condition No complications  Fistula / Graft Assessment Present;Thrill;Bruit  Status Accessed  Needle Size 16 (short needles per outpt order)  Drainage Description None

## 2019-05-24 NOTE — ED Notes (Signed)
Gardiner Barefoot, NP notified of no urine production and chest pain. Telephone orders received.

## 2019-05-24 NOTE — Progress Notes (Signed)
HD Tx Start   05/24/19 0945  Vital Signs  Pulse Rate 82  Resp 16  BP (!) 155/68  Oxygen Therapy  SpO2 100 %  During Hemodialysis Assessment  Blood Flow Rate (mL/min) 350 mL/min  Arterial Pressure (mmHg) -190 mmHg  Venous Pressure (mmHg) 120 mmHg  Transmembrane Pressure (mmHg) 40 mmHg  Ultrafiltration Rate (mL/min) 870 mL/min  Dialysate Flow Rate (mL/min) 600 ml/min  Conductivity: Machine  14  HD Safety Checks Performed Yes  Dialysis Fluid Bolus Normal Saline  Bolus Amount (mL) 250 mL  Intra-Hemodialysis Comments Tx initiated

## 2019-05-24 NOTE — Progress Notes (Signed)
Discussed patient status and planning for dialysis today with April, RN. --waiting on CCU availability. Communicated that that in the case pt condition worsens, can dialyze in ED if pt were transferred to Rm17 (only room which has water connected needed for portable dialysis RO).

## 2019-05-24 NOTE — ED Notes (Signed)
Pt's mouth swabbed with lemon glycerin swabs for comfort.

## 2019-05-24 NOTE — Progress Notes (Addendum)
Notify Gardiner Barefoot, NP about patient's complaints of chest pain 3/10, patient is asking if we can increase dose of her Norco for q6 PRN since that is how she take it at home. Here she can only takes is daily, order given for just a one time dose Norco 5 mg. RN just gave flexeril and tylenol, will time Norco for later date. Also asked patient if she is allergic to hydrocodone/codeine as it is listed on her allergies, per patient she has been taking hydrocodone without any reaction. RN will continue to monitor.

## 2019-05-24 NOTE — ED Notes (Signed)
Admission MD at bedside.  

## 2019-05-24 NOTE — H&P (Signed)
[facility] NAME: Phyllis [name]    MR#:  [id number]  DATE OF BIRTH:  26-Dec-1945  DATE OF ADMISSION:  05/23/2019  PRIMARY CARE PHYSICIAN: [name], MD   REQUESTING/REFERRING PHYSICIAN: Lurline Hare, MD CHIEF COMPLAINT:   Chief Complaint  Patient presents with  . Shortness of Breath    HISTORY OF PRESENT ILLNESS:  Phyllis [name]  is a 73 y.o. Caucasian female with a known history of end-stage renal disease on hemodialysis, who presented to the emergency room with acute onset of worsening dyspnea since yesterday without significant orthopnea or paroxysmal nocturnal dyspnea or lower extremity edema.  She denied any fever or chills.  No recent sick exposures.  She has been having chest pain only with deep breathing which she attributes to her costochondritis.  She denied any headache or dizziness or blurred vision.  She denied any rhinorrhea or nasal congestion or sore throat.  She was in significant respiratory distress she had to be placed on BiPAP.  Upon presentation to the emergency room, heart rate was 106 with respiratory to 31 with a pulse currently 100% on BiPAP.  ABG showed pH 7.45, bicarbonate 31.3, PCO2 45, PO2 98 and O2 sats of 97.9 on 4 L O2 by nasal cannula.  BNP was 895 and high-sensitivity troponin was 30.  CBC showed leukocytosis of 14.7 with anemia that is better than previous levels.  COVID-19 test came back negative.  The patient was given 80 mg of IV Lasix.  She will be admitted to a stepdown bed for further evaluation and management. PAST MEDICAL HISTORY:   Past Medical History:  Diagnosis Date  . (HFpEF) heart failure with preserved ejection fraction ([place])    a. TTE 01/2014: nl LV sys fxn, no valvular abnormalities; b. TTE 11/16: nl EF, mild LVH;  c. 04/2019 Echo: EF 60-65%. DD. Nl RV fxn. Mod AS. 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  . Allergy   . Anemia of chronic disease   . Anxiety    . Aortic atherosclerosis ([place])   . Asthma   . Chronic back pain   . COPD (chronic obstructive pulmonary disease) ([place])   . Diabetes mellitus with complication ([place])   . ESRD on hemodialysis ([place])    a. Tues/Sat; b. 2/2 small kidneys  . Essential hypertension   . Fistula    lower left arm  . GERD (gastroesophageal reflux disease)   . Gout   . History of exercise stress test    a. 01/2014: no evidence of ischemia; b. Lexiscan 08/2015: no sig ischemia, severe GI uptake artifact, low risk; c. CPET @ [facility] 09/2016: exercised 3 min 12 sec on bike without incline, 2.28 METs, VO2 of 8.1, 48% of predicted, indicating mod to sev functional impairment, evidence of blunted HR, stroke volume, and BP augmentation as well as ventilation-perfusion mismatch with exercise  . HLD (hyperlipidemia)   . Moderate aortic stenosis    a. 04/2019 Echo: Mod AS.  . Non-obstructive Carotid arterial disease ([place])    a. 12/2017: <50% bilat ICA dzs.  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 Sleep apnea     PAST SURGICAL HISTORY:   Past Surgical History:  Procedure Laterality Date  . carpel tunnel    . GALLBLADDER SURGERY    . PERIPHERAL VASCULAR CATHETERIZATION N/A 04/12/2015   Procedure: A/V Shuntogram/Fistulagram;  Surgeon: Algernon Huxley, MD;  Location: [facility];  Service: Cardiovascular;  Laterality: N/A;  . PERIPHERAL VASCULAR CATHETERIZATION N/A 04/12/2015   Procedure: A/V Shunt Intervention;  Surgeon: Algernon Huxley, MD;  Location: [facility];  Service: Cardiovascular;  Laterality: N/A;  . PERIPHERAL VASCULAR CATHETERIZATION N/A 06/09/2015   Procedure: Dialysis/Perma Catheter Removal;  Surgeon: Katha Cabal, MD;  Location: [facility];  Service: Cardiovascular;  Laterality: N/A;    SOCIAL HISTORY:   Social History   Tobacco Use  . Smoking status: Never Smoker  . Smokeless tobacco: Never Used  Substance Use Topics  . Alcohol use: No    FAMILY  HISTORY:   Family History  Problem Relation Age of Onset  . Hypertension Mother   . Hyperlipidemia Mother   . Heart disease Father   . Heart attack Father 6  . Hypertension Father   . Hyperlipidemia Father   . Heart disease Brother        CABG   . Heart attack Brother   . Breast cancer Neg Hx     DRUG ALLERGIES:   Allergies  Allergen Reactions  . Codeine Nausea And Vomiting and Nausea Only  . Enalapril Maleate Other (See Comments)    Other reaction(s): Headache  . Nitrofurantoin Swelling and Rash    Other Reaction: swelling of body  . Sulfamethoxazole-Trimethoprim Swelling  . Vicodin [Hydrocodone-Acetaminophen] Nausea And Vomiting and Nausea Only  . 2,4-D Dimethylamine (Amisol) Rash and Other (See Comments)    Other Reaction: h/a  . Baclofen Other (See Comments) and Nausea Only    lightheadness ,drowsiness , muscle weakness , twitching in hands   . Neosporin [Neomycin-Bacitracin Zn-Polymyx] Other (See Comments) and Rash    Other Reaction: irritation Skin irritation   . Quinine Nausea And Vomiting, Rash and Other (See Comments)    Other Reaction: Vomiting, rash, h/a, vision  . Ultram [Tramadol] Palpitations  . Zocor [Simvastatin] Other (See Comments) and Rash    Other Reaction: muscle spasms Muscle pain and spasms  . Bactrim [Sulfamethoxazole-Trimethoprim] Swelling  . Levodopa Other (See Comments)    Reaction: unknown  . Macrodantin [Nitrofurantoin Macrocrystal] Swelling  . Quinine Derivatives Other (See Comments)    Vertigo,nausea vomiting blurred vision headache ears sensitivity     REVIEW OF SYSTEMS:   ROS As per history of present illness. All pertinent systems were reviewed above. Constitutional,  HEENT, cardiovascular, respiratory, GI, GU, musculoskeletal, neuro, psychiatric, endocrine,  integumentary and hematologic systems were reviewed and are otherwise  negative/unremarkable except for positive findings mentioned above in the HPI.   MEDICATIONS AT  HOME:   Prior to Admission medications   Medication Sig Start Date End Date Taking? Authorizing Provider  albuterol (PROVENTIL HFA;VENTOLIN HFA) 108 (90 Base) MCG/ACT inhaler Inhale 2 puffs into the lungs every 6 (six) hours as needed for wheezing or shortness of breath.   Yes [provider]  albuterol (PROVENTIL) (2.5 MG/3ML) 0.083% nebulizer solution Take 3 mLs (2.5 mg total) by nebulization every 6 (six) hours as needed for wheezing or shortness of breath. 05/04/19  Yes Nance Pear, MD  amLODipine (NORVASC) 10 MG tablet Take 10 mg by mouth.  12/29/13  Yes [provider]  ANECREAM 4 % cream APPLY TOPICALLY 2 (TWO) TIMES DAILY FOR 30 DAYS 05/12/19  Yes [provider]  calcium acetate (PHOSLO) 667 MG capsule Take 667 mg by mouth 3 (three) times daily with meals.    Yes [provider]  carvedilol (COREG) 12.5 MG tablet Take 12.5 mg by mouth 2 (  two) times daily with a meal.  01/09/14  Yes [provider]  cetirizine (ZYRTEC) 10 MG tablet Take 10 mg by mouth at bedtime.    Yes [provider]  cyclobenzaprine (FLEXERIL) 10 MG tablet Take 10 mg by mouth daily as needed for muscle spasms.    Yes [provider]  Fluticasone-Salmeterol (ADVAIR) 250-50 MCG/DOSE AEPB Inhale 1 puff into the lungs 2 (two) times daily as needed (for shortness of breath).    Yes [provider]  furosemide (LASIX) 80 MG tablet Take 1 tablet (80 mg total) by mouth daily. 03/17/18  Yes Wieting, Richard, MD  hydrALAZINE (APRESOLINE) 25 MG tablet Take 25 mg by mouth 3 (three) times daily. 03/25/19  Yes [provider]  HYDROcodone-acetaminophen (NORCO) 7.5-325 MG tablet Take 1-2 tablets by mouth daily as needed (pain).  04/09/19  Yes [provider]  lidocaine-prilocaine (EMLA) cream Apply 1 application topically as needed (prior to treatment).    Yes [provider]  losartan (COZAAR) 50 MG tablet Take 50 mg by mouth daily. 03/18/19   Yes [provider]  omeprazole (PRILOSEC) 20 MG capsule Take 20 mg by mouth 2 (two) times daily before a meal.   Yes [provider]  ondansetron (ZOFRAN) 4 MG tablet Take 4 mg by mouth every 8 (eight) hours as needed. 05/20/19  Yes [provider]  oxybutynin (DITROPAN XL) 15 MG 24 hr tablet Take 15 mg by mouth daily. 12/24/17  Yes [provider]  pravastatin (PRAVACHOL) 40 MG tablet Take 40 mg by mouth at bedtime.    Yes [provider]  topiramate (TOPAMAX) 25 MG tablet Take 25 mg by mouth as needed (for headaches).    Yes [provider]  clopidogrel (PLAVIX) 75 MG tablet Take 1 tablet (75 mg total) by mouth daily. 01/18/18   Epifanio Lesches, MD  predniSONE (DELTASONE) 20 MG tablet Take 40 mg by mouth daily with breakfast.    [provider]      VITAL SIGNS:  Blood pressure (!) 158/60, pulse 75, temperature 98.1 F (36.7 C), resp. rate 16, height 4\' 10"  (1.473 m), weight 57.4 kg, SpO2 100 %.  PHYSICAL EXAMINATION:  Physical Exam  GENERAL:  73 y.o.-year-old Caucasian female patient lying in the bed with mild respiratory distress on BiPAP EYES: Pupils equal, round, reactive to light and accommodation. No scleral icterus. Extraocular muscles intact.  HEENT: Head atraumatic, normocephalic. Oropharynx and nasopharynx clear.  NECK:  Supple, no jugular venous distention. No thyroid enlargement, no tenderness.  LUNGS: Diminished bibasilar breath sounds with bibasal crackles extending to some degree to the midlung zones CARDIOVASCULAR: Regular rate and rhythm, S1, S2 normal. No murmurs, rubs, or gallops.  ABDOMEN: Soft, nondistended, nontender. Bowel sounds present. No organomegaly or mass.  EXTREMITIES: Trace bilateral lower extremity pitting edema with no clubbing or cyanosis.  NEUROLOGIC: Cranial nerves II through XII are intact. Muscle strength 5/5 in all extremities. Sensation intact. Gait not checked.  PSYCHIATRIC: The  patient is alert and oriented x 3.  Normal affect and good eye contact. SKIN: No obvious rash, lesion, or ulcer.   LABORATORY PANEL:   CBC Recent Labs  Lab 05/24/19 0159  WBC 12.2*  HGB 9.7*  HCT 30.1*  PLT 125*   ------------------------------------------------------------------------------------------------------------------  Chemistries  Recent Labs  Lab 05/23/19 2259 05/24/19 0159  NA 139  --   K 4.0  --   CL 97*  --   CO2 28  --   GLUCOSE 208*  --  BUN 32*  --   CREATININE 5.33* 5.56*  CALCIUM 7.8*  --   AST 17  --   ALT 12  --   ALKPHOS 86  --   BILITOT 0.7  --    ------------------------------------------------------------------------------------------------------------------  Cardiac Enzymes No results for input(s): TROPONINI in the last 168 hours. ------------------------------------------------------------------------------------------------------------------  RADIOLOGY:  Dg Chest Port 1 View  Result Date: 05/23/2019 CLINICAL DATA:  Shortness of breath. EXAM: PORTABLE CHEST 1 VIEW COMPARISON:  05/07/2019 FINDINGS: Unchanged cardiomegaly and mediastinal contours. Tortuosity and atherosclerosis of the thoracic aorta. Worsening interstitial and peribronchial thickening consistent with pulmonary edema. No definite pleural fluid or focal airspace disease. Overlying support apparatus partially obscure the left chest. IMPRESSION: Worsening pulmonary edema with grossly unchanged cardiomegaly. Electronically Signed   By: Keith Rake M.D.   On: 05/23/2019 23:24      IMPRESSION AND PLAN:   1.  Acute on chronic diastolic CHF with associated fluid overload.  The patient will be admitted to a telemetry bed.  Will follow serial cardiac biomarkers.  The patient had a 2D echo on 05/06/2019 revealing an EF of 60 to 65% with mild to moderate left atrial dilatation.  Will diurese her with IV Lasix 80 mg every 12 hours.  A cardiology consultation will be obtained by Dr.  Curt Bears who was notified about the patient  2.  End-stage renal disease on hemodialysis with current fluid overload.  Nephrology consultation will be obtained by Dr. Holley Raring who was notified about the case.  3.  Chest pain.  This is likely musculoskeletal.  It could be related to her costochondritis.  Pain management will be provided.  Will follow serial troponin I.  4.  Hypertension.  Amlodipine, hydralazine and Coreg will be resumed.  5.  GERD.  PPI therapy will be resumed.  6.  DVT prophylaxis.  Subcutaneous heparin.   All the records are reviewed and case discussed with ED provider. The plan of care was discussed in details with the patient (and family). I answered all questions. The patient agreed to proceed with the above mentioned plan. Further management will depend upon hospital course.   CODE STATUS: Full code  TOTAL TIME TAKING CARE OF THIS PATIENT: 55 minutes.    Christel Mormon M.D on 05/24/2019 at 2:36 AM  Pager - [phone]  After 6pm go to [email or web address] - Proofreader  [facility]  Office  [phone]  CC: Primary care physician; [name], MD   Note: This dictation was prepared with Dragon dictation along with smaller phrase technology. Any transcriptional errors that result from this process are unintentional.

## 2019-05-24 NOTE — Progress Notes (Signed)
Advance care planning  Purpose of Encounter CHF, end-stage renal disease  Parties in Attendance Patient  Patients Decisional capacity Alert and oriented.  Able to make medical decisions.  No documented healthcare power of attorney.  She tells me she does not have anyone who would make decisions for her if she is unable to.  Emergency contact his friend.  No ACP documents in place.  Discussed in detail regarding CHF, end-stage renal disease.  Treatment plan , prognosis discussed.  All questions answered  Discussed regarding CODE STATUS and patient wishes for aggressive medical care with CPR/defibrillation/intubation if needed  Orders entered and CODE STATUS changed  FULL CODE  Time spent - 17 minutes

## 2019-05-24 NOTE — Consult Note (Signed)
CRITICAL CARE NOTE      CHIEF COMPLAINT:   Acute hypoxemic respiratory failure   HISTORY OF PRESENT ILLNESS   This is a pleasant 73 year old female with a history of ESRD currently on hemodialysis, diastolic CHF, COPD, who came into the hospital with complaints of worsening dyspnea was found to have significant hypoxemia requiring 100% BiPAP.  She had a arterial blood gas done in the ER which was significant for hypoxemia.  Her BNP level was elevated over 800.  She was also found to have leukocytosis over 14.  Her COVID testing was negative.  She was admitted to MICU with consultation placed by hospitalist service for acute hypoxemic respiratory failure.  PAST MEDICAL HISTORY   Past Medical History:  Diagnosis Date  . (HFpEF) heart failure with preserved ejection fraction ([place])    a. TTE 01/2014: nl LV sys fxn, no valvular abnormalities; b. TTE 11/16: nl EF, mild LVH;  c. 04/2019 Echo: EF 60-65%. DD. Nl RV fxn. Mod AS. 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  . Allergy   . Anemia of chronic disease   . Anxiety   . Aortic atherosclerosis ([place])   . Asthma   . Chronic back pain   . COPD (chronic obstructive pulmonary disease) ([place])   . Diabetes mellitus with complication ([place])   . ESRD on hemodialysis ([place])    a. Tues/Sat; b. 2/2 small kidneys  . Essential hypertension   . Fistula    lower left arm  . GERD (gastroesophageal reflux disease)   . Gout   . History of exercise stress test    a. 01/2014: no evidence of ischemia; b. Lexiscan 08/2015: no sig ischemia, severe GI uptake artifact, low risk; c. CPET @ [facility] 09/2016: exercised 3 min 12 sec on bike without incline, 2.28 METs, VO2 of 8.1, 48% of predicted, indicating mod to sev functional impairment, evidence of blunted HR, stroke volume, and BP  augmentation as well as ventilation-perfusion mismatch with exercise  . HLD (hyperlipidemia)   . Moderate aortic stenosis    a. 04/2019 Echo: Mod AS.  . Non-obstructive Carotid arterial disease ([place])    a. 12/2017: <50% bilat ICA dzs.  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 Sleep apnea      SURGICAL HISTORY   Past Surgical History:  Procedure Laterality Date  . carpel tunnel    . GALLBLADDER SURGERY    . PERIPHERAL VASCULAR CATHETERIZATION N/A 04/12/2015   Procedure: A/V Shuntogram/Fistulagram;  Surgeon: Algernon Huxley, MD;  Location: [facility];  Service: Cardiovascular;  Laterality: N/A;  . PERIPHERAL VASCULAR CATHETERIZATION N/A 04/12/2015   Procedure: A/V Shunt Intervention;  Surgeon: Algernon Huxley, MD;  Location: [facility];  Service: Cardiovascular;  Laterality: N/A;  . PERIPHERAL VASCULAR CATHETERIZATION N/A 06/09/2015   Procedure: Dialysis/Perma Catheter Removal;  Surgeon: Katha Cabal, MD;  Location: [facility];  Service: Cardiovascular;  Laterality: N/A;     FAMILY HISTORY   Family History  Problem Relation Age of Onset  . Hypertension Mother   . Hyperlipidemia Mother   . Heart disease Father   . Heart attack Father 64  . Hypertension Father   . Hyperlipidemia Father   . Heart disease Brother        CABG   . Heart attack Brother   . Breast cancer Neg Hx      SOCIAL HISTORY   Social History   Tobacco  Use  . Smoking status: Never Smoker  . Smokeless tobacco: Never Used  Substance Use Topics  . Alcohol use: No  . Drug use: No     MEDICATIONS   Current Medication:  Current Facility-Administered Medications:  .  0.9 %  sodium chloride infusion, 250 mL, Intravenous, PRN, Mansy, Jan A, MD .  acetaminophen (TYLENOL) tablet 650 mg, 650 mg, Oral, Q4H PRN, Mansy, Jan A, MD .  albuterol (PROVENTIL) (2.5 MG/3ML) 0.083% nebulizer solution 2.5 mg, 2.5 mg, Nebulization, Q6H PRN, Mansy, Jan A, MD .  amLODipine  (NORVASC) tablet 10 mg, 10 mg, Oral, Daily, Mansy, Jan A, MD .  calcium acetate (PHOSLO) capsule 667 mg, 667 mg, Oral, TID WC, Mansy, Jan A, MD, 667 mg at 05/24/19 0915 .  carvedilol (COREG) tablet 12.5 mg, 12.5 mg, Oral, BID WC, Mansy, Jan A, MD .  Chlorhexidine Gluconate Cloth 2 % PADS 6 each, 6 each, Topical, Q0600, Holley Raring, Munsoor, MD, 6 each at 05/24/19 0839 .  clopidogrel (PLAVIX) tablet 75 mg, 75 mg, Oral, Daily, Mansy, Jan A, MD, 75 mg at 05/24/19 0915 .  cyclobenzaprine (FLEXERIL) tablet 10 mg, 10 mg, Oral, Daily PRN, Mansy, Jan A, MD .  Derrill Memo ON 05/27/2019] epoetin alfa (EPOGEN) injection 4,000 Units, 4,000 Units, Intravenous, Q T,Th,Sa-HD, Lateef, Munsoor, MD .  furosemide (LASIX) injection 80 mg, 80 mg, Intravenous, Q12H, Mansy, Jan A, MD, Stopped at 05/24/19 0152 .  heparin injection 5,000 Units, 5,000 Units, Subcutaneous, Q12H, Mansy, Jan A, MD, 5,000 Units at 05/24/19 0539 .  hydrALAZINE (APRESOLINE) tablet 25 mg, 25 mg, Oral, TID, Mansy, Jan A, MD .  HYDROcodone-acetaminophen ([place]) 7.5-325 MG per tablet 1-2 tablet, 1-2 tablet, Oral, Daily PRN, Mansy, Jan A, MD .  insulin aspart (novoLOG) injection 0-9 Units, 0-9 Units, Subcutaneous, TID WC, Mansy, Arvella Merles, MD, 2 Units at 05/24/19 0755 .  loratadine (CLARITIN) tablet 10 mg, 10 mg, Oral, Daily, Mansy, Jan A, MD, 10 mg at 05/24/19 0915 .  losartan (COZAAR) tablet 50 mg, 50 mg, Oral, Daily, Mansy, Jan A, MD .  ondansetron [facility]) injection 4 mg, 4 mg, Intravenous, Q6H PRN, Mansy, Jan A, MD .  ondansetron [facility]) tablet 4 mg, 4 mg, Oral, Q8H PRN, Mansy, Jan A, MD .  pantoprazole (PROTONIX) EC tablet 40 mg, 40 mg, Oral, Daily, Mansy, Jan A, MD, 40 mg at 05/24/19 0915 .  pravastatin (PRAVACHOL) tablet 40 mg, 40 mg, Oral, QHS, Mansy, Jan A, MD .  predniSONE (DELTASONE) tablet 40 mg, 40 mg, Oral, Q breakfast, Mansy, Jan A, MD, 40 mg at 05/24/19 0915 .  sodium chloride flush (NS) 0.9 % injection 3 mL, 3 mL, Intravenous, Q12H, Mansy, Jan A,  MD, 3 mL at 05/24/19 0915 .  sodium chloride flush (NS) 0.9 % injection 3 mL, 3 mL, Intravenous, PRN, Mansy, Jan A, MD .  topiramate (TOPAMAX) tablet 25 mg, 25 mg, Oral, PRN, Mansy, Jan A, MD .  zolpidem (AMBIEN) tablet 5 mg, 5 mg, Oral, QHS PRN, Mansy, Jan A, MD    ALLERGIES   Codeine; Enalapril maleate; Nitrofurantoin; Sulfamethoxazole-trimethoprim; Vicodin [hydrocodone-acetaminophen]; 2,4-d dimethylamine (amisol); Baclofen; Neosporin [neomycin-bacitracin zn-polymyx]; Quinine; Ultram [tramadol]; Zocor [simvastatin]; Bactrim [sulfamethoxazole-trimethoprim]; Levodopa; Macrodantin [nitrofurantoin macrocrystal]; and Quinine derivatives    REVIEW OF SYSTEMS    Point ROS conducted and is negative except as per HPI  PHYSICAL EXAMINATION   Vitals:   05/24/19 1215 05/24/19 1230  BP: (!) 169/69 (!) 158/67  Pulse: 83 85  Resp: 16 17  Temp:  SpO2: 100% 100%    GENERAL:mild distress due to sob HEAD: Normocephalic, atraumatic.  EYES: Pupils equal, round, reactive to light.  No scleral icterus.  MOUTH: Moist mucosal membrane. NECK: Supple. No thyromegaly. No nodules. No JVD.  PULMONARY: mild bibasilar crackles CARDIOVASCULAR: S1 and S2. Regular rate and rhythm. No murmurs, rubs, or gallops.  GASTROINTESTINAL: Soft, nontender, non-distended. No masses. Positive bowel sounds. No hepatosplenomegaly.  MUSCULOSKELETAL: No swelling, clubbing, or edema.  NEUROLOGIC: Mild distress due to acute illness SKIN:intact,warm,dry   LABS AND IMAGING       LAB RESULTS: Recent Labs  Lab 05/23/19 2259 05/24/19 0159  NA 139  --   K 4.0  --   CL 97*  --   CO2 28  --   BUN 32*  --   CREATININE 5.33* 5.56*  GLUCOSE 208*  --    Recent Labs  Lab 05/23/19 2259 05/24/19 0159 05/24/19 0413  HGB 9.8* 9.7* 9.6*  HCT 30.8* 30.1* 30.0*  WBC 14.7* 12.2* 9.5  PLT 182 125* 125*     IMAGING RESULTS: Dg Chest Port 1 View  Result Date: 05/23/2019 CLINICAL DATA:  Shortness of breath. EXAM:  PORTABLE CHEST 1 VIEW COMPARISON:  05/07/2019 FINDINGS: Unchanged cardiomegaly and mediastinal contours. Tortuosity and atherosclerosis of the thoracic aorta. Worsening interstitial and peribronchial thickening consistent with pulmonary edema. No definite pleural fluid or focal airspace disease. Overlying support apparatus partially obscure the left chest. IMPRESSION: Worsening pulmonary edema with grossly unchanged cardiomegaly. Electronically Signed   By: Keith Rake M.D.   On: 05/23/2019 23:24      ASSESSMENT AND PLAN    -Multidisciplinary rounds held today  Acute Hypoxic Respiratory Failure  - due to decompensated diastolic chf EF >39 -Patient has been improving and is weaned off of BIPAP now - will optimize for transfer to med tele   Acute decompensated heart failure with preserved EF over 60% -Status post cardiology evaluation-appreciate input -oxygen as needed -Lasix as tolerated -follow up cardiac enzymes as indicated ICU monitoring   Renal Failure-ESRD -Status post hemodialysis with significant improvement-nephrology on case appreciate input -follow chem 7 -follow UO -continue Foley Catheter-assess need daily   NEUROLOGY - intubated and sedated - minimal sedation to achieve a RASS goal: -1 Wake up assessment pending   GI/Nutrition GI PROPHYLAXIS as indicated DIET-->TF's as tolerated Constipation protocol as indicated  ENDO - ICU hypoglycemic\Hyperglycemia protocol -check FSBS per protocol   ELECTROLYTES -follow labs as needed -replace as needed -pharmacy consultation   DVT/GI PRX ordered -SCDs  TRANSFUSIONS AS NEEDED MONITOR FSBS ASSESS the need for LABS as needed   Critical care provider statement:    Critical care time (minutes):  32   Critical care time was exclusive of:  Separately billable procedures and treating other patients   Critical care was necessary to treat or prevent imminent or life-threatening deterioration of the following  conditions:   Cute hypoxemic respiratory failure, acute decompensated diastolic CHF, renal failure, fluid overload, multiple comorbid conditions   Critical care was time spent personally by me on the following activities:  Development of treatment plan with patient or surrogate, discussions with consultants, evaluation of patient's response to treatment, examination of patient, obtaining history from patient or surrogate, ordering and performing treatments and interventions, ordering and review of laboratory studies and re-evaluation of patient's condition.  I assumed direction of critical care for this patient from another provider in my specialty: no    This document was prepared using Dragon voice recognition software and  may include unintentional dictation errors.    Ottie Glazier, M.D.  Division of [place]

## 2019-05-24 NOTE — Progress Notes (Signed)
Pre HD Assessment    05/24/19 0930  Neurological  Level of Consciousness Alert  Orientation Level Oriented X4  Respiratory  Respiratory Pattern Regular;Unlabored  Chest Assessment Chest expansion symmetrical  Bilateral Breath Sounds Clear;Diminished  Cough None  Cardiac  Pulse Regular  Heart Sounds S1, S2;No adventitious heart sounds  Jugular Venous Distention (JVD) No  Cardiac Rhythm NSR  Antiarrhythmic device No  Vascular  R Radial Pulse +2  L Radial Pulse +2  Integumentary  Integumentary (WDL) X  Skin Color Appropriate for ethnicity  Skin Condition Dry;Flaky (on feet)  Skin Integrity Ecchymosis  Ecchymosis Location Abdomen  Ecchymosis Location Orientation Left  Musculoskeletal  Musculoskeletal (WDL) X  Generalized Weakness Yes  Gastrointestinal  Bowel Sounds Assessment Active  GU Assessment  Genitourinary (WDL) X  Genitourinary Symptoms Oliguria;External catheter  External Urinary Catheter  Placement Date/Time: 05/24/19 0830   Person Inserting Catheter: Phyllis [name]  External Urinary Catheter Type: Female  Collection Container Dedicated Suction Canister  Securement Method Other (Comment) (patient anatomy )  Psychosocial  Psychosocial (WDL) WDL

## 2019-05-24 NOTE — ED Notes (Signed)
Critical potassium of 2.1 called from Phyllis [name] in lab. Dr. Alfred Levins notified, no new verbal orders received.

## 2019-05-24 NOTE — Progress Notes (Signed)
Post HD Tx  1117mL fluid removal (unable to reach Md ordered fluid removal goal d/t pt c/o cramping during tx)  Tolerated well overall, unlabored breathing sats 100% 2L [place].    05/24/19 1243  Neurological  Level of Consciousness Alert  Orientation Level Oriented X4  Respiratory  Respiratory Pattern Regular;Unlabored  Chest Assessment Chest expansion symmetrical  Bilateral Breath Sounds Clear;Diminished  Cardiac  Pulse Regular  Heart Sounds S1, S2;No adventitious heart sounds  Jugular Venous Distention (JVD) No  Cardiac Rhythm NSR  Antiarrhythmic device No  Vascular  R Radial Pulse +2  L Radial Pulse +2  Integumentary  Integumentary (WDL) X  Skin Color Appropriate for ethnicity  Skin Condition Dry;Flaky (on feet)  Skin Integrity Ecchymosis  Ecchymosis Location Abdomen  Ecchymosis Location Orientation Left  Musculoskeletal  Musculoskeletal (WDL) X  Generalized Weakness Yes  Gastrointestinal  Bowel Sounds Assessment Active  GU Assessment  Genitourinary (WDL) X  Genitourinary Symptoms Oliguria;External catheter  External Urinary Catheter  Placement Date/Time: 05/24/19 0830   Person Inserting Catheter: Marcene Brawn Knightg  External Urinary Catheter Type: Female  Collection Container Dedicated Suction Canister  Securement Method Other (Comment) (patient anatomy )  Psychosocial  Psychosocial (WDL) WDL

## 2019-05-24 NOTE — ED Notes (Signed)
Pt awoken by rn in room, pt states "my chest hurts". Pt complains of central chest pain 7/10.

## 2019-05-24 NOTE — ED Notes (Signed)
Report to angela, rn. 

## 2019-05-24 NOTE — ED Notes (Signed)
No urine produced at this time. Pt resting.

## 2019-05-24 NOTE — Progress Notes (Signed)
Pt taken off bipap and placed on 3lpm [place], respiratory rate 20/min, no shortness of breath noted, sats 98%. Tolerating well at this time.

## 2019-05-24 NOTE — ED Notes (Signed)
Gardiner Barefoot, NP notified of troponin of 79, critical value called from lab. No new orders received. Seals, NP notified pt continues to have no urine output.

## 2019-05-25 DIAGNOSIS — I5031 Acute diastolic (congestive) heart failure: Secondary | ICD-10-CM

## 2019-05-25 LAB — BASIC METABOLIC PANEL
Anion gap: 13 (ref 5–15)
BUN: 35 mg/dL — ABNORMAL HIGH (ref 8–23)
CO2: 28 mmol/L (ref 22–32)
Calcium: 8.3 mg/dL — ABNORMAL LOW (ref 8.9–10.3)
Chloride: 100 mmol/L (ref 98–111)
Creatinine, Ser: 4.77 mg/dL — ABNORMAL HIGH (ref 0.44–1.00)
GFR calc Af Amer: 10 mL/min — ABNORMAL LOW (ref >60)
GFR calc non Af Amer: 9 mL/min — ABNORMAL LOW (ref >60)
Glucose, Bld: 152 mg/dL — ABNORMAL HIGH (ref 70–99)
Potassium: 4.3 mmol/L (ref 3.5–5.1)
Sodium: 141 mmol/L (ref 135–145)

## 2019-05-25 LAB — GLUCOSE, CAPILLARY
Glucose-Capillary: 116 mg/dL — ABNORMAL HIGH (ref 70–99)
Glucose-Capillary: 118 mg/dL — ABNORMAL HIGH (ref 70–99)
Glucose-Capillary: 200 mg/dL — ABNORMAL HIGH (ref 70–99)
Glucose-Capillary: 182 mg/dL — ABNORMAL HIGH (ref 70–99)

## 2019-05-25 LAB — PARATHYROID HORMONE, INTACT (NO CA): PTH: 160 pg/mL — ABNORMAL HIGH (ref 15–65)

## 2019-05-25 NOTE — Progress Notes (Signed)
Progress Note  [name] Date of Encounter: 05/25/2019  Primary Cardiologist: Ida Rogue, MD   Subjective   She underwent dialysis yesterday.  She feels better overall.  She continues to receive IV furosemide.  No chest pain.  Inpatient Medications    Scheduled Meds: . amLODipine  10 mg Oral Daily  . calcium acetate  667 mg Oral TID WC  . carvedilol  12.5 mg Oral BID WC  . Chlorhexidine Gluconate Cloth  6 each Topical Q0600  . clopidogrel  75 mg Oral Daily  . [START ON 05/27/2019] epoetin (EPOGEN/PROCRIT) injection  4,000 Units Intravenous Q T,Th,Sa-HD  . furosemide  80 mg Intravenous Q12H  . heparin  5,000 Units Subcutaneous Q12H  . hydrALAZINE  25 mg Oral TID  . insulin aspart  0-9 Units Subcutaneous TID WC  . loratadine  10 mg Oral Daily  . losartan  50 mg Oral Daily  . oxybutynin  15 mg Oral Daily  . pantoprazole  40 mg Oral Daily  . pravastatin  40 mg Oral QHS  . predniSONE  40 mg Oral Q breakfast  . sodium chloride flush  3 mL Intravenous Q12H   Continuous Infusions: . sodium chloride     PRN Meds: sodium chloride, acetaminophen, albuterol, cyclobenzaprine, HYDROcodone-acetaminophen, HYDROcodone-acetaminophen, ondansetron (ZOFRAN) IV, ondansetron, sodium chloride flush, topiramate, zolpidem   Vital Signs    Vitals:   05/24/19 1548 05/24/19 1917 05/25/19 0335 05/25/19 0708  BP:  (!) 143/55 (!) 140/50 (!) 139/53  Pulse:  88 79 76  Resp:  20 20 18   Temp:  98.4 F (36.9 C) 98.2 F (36.8 C) 98.6 F (37 C)  TempSrc:  Oral Oral Oral  SpO2:  100% 98% 98%  Weight: 57.2 kg  58.6 kg   Height: 4\' 10"  (1.473 m)       Intake/Output Summary (Last 24 hours) at 05/25/2019 1226 Last data filed at 05/25/2019 1012 Gross per 24 hour  Intake 483 ml  Output 1429 ml  Net -946 ml   Last 3 Weights 05/25/2019 05/24/2019 05/24/2019  Weight (lbs) 129 lb 1.6 oz 126 lb 125 lb 7.1 oz  Weight (kg) 58.559 kg 57.153 kg 56.9 kg      Telemetry    Normal sinus  rhythm- Personally Reviewed  ECG     - Personally Reviewed  Physical Exam   GEN: No acute distress.   Neck: No JVD Cardiac: RRR,  rubs, or gallops.  3 out of 6 crescendo decrescendo systolic murmur in the aortic area which is mid to late peaking with slightly diminished S2. Respiratory:  Decreased breath sounds at the base with diminished crackles improved from yesterday GI: Soft, nontender, non-distended  MS: No edema; No deformity. Neuro:  Nonfocal  Psych: Normal affect   Labs    High Sensitivity Troponin:   Recent Labs  Lab 05/23/19 2259 05/24/19 0413 05/24/19 0746  TROPONINIHS 30* 79* 93*      Cardiac EnzymesNo results for input(s): TROPONINI in the last 168 hours. No results for input(s): TROPIPOC in the last 168 hours.   Chemistry Recent Labs  Lab 05/23/19 2259 05/24/19 0159 05/25/19 0417  NA 139  --  141  K 4.0  --  4.3  CL 97*  --  100  CO2 28  --  28  GLUCOSE 208*  --  152*  BUN 32*  --  35*  CREATININE 5.33* 5.56* 4.77*  CALCIUM 7.8*  --  8.3*  PROT 6.8  --   --  ALBUMIN 4.2  --   --   AST 17  --   --   ALT 12  --   --   ALKPHOS 86  --   --   BILITOT 0.7  --   --   GFRNONAA 7* 7* 9*  GFRAA 9* 8* 10*  ANIONGAP 14  --  13     Hematology Recent Labs  Lab 05/23/19 2259 05/24/19 0159 05/24/19 0413  WBC 14.7* 12.2* 9.5  RBC 3.31* 3.26* 3.24*  HGB 9.8* 9.7* 9.6*  HCT 30.8* 30.1* 30.0*  MCV 93.1 92.3 92.6  MCH 29.6 29.8 29.6  MCHC 31.8 32.2 32.0  RDW 14.0 13.7 13.6  PLT 182 125* 125*    BNP Recent Labs  Lab 05/23/19 2300  BNP 895.0*     DDimer No results for input(s): DDIMER in the last 168 hours.   Radiology    Dg Chest Port 1 View  Result Date: 05/23/2019 CLINICAL DATA:  Shortness of breath. EXAM: PORTABLE CHEST 1 VIEW COMPARISON:  05/07/2019 FINDINGS: Unchanged cardiomegaly and mediastinal contours. Tortuosity and atherosclerosis of the thoracic aorta. Worsening interstitial and peribronchial thickening consistent with  pulmonary edema. No definite pleural fluid or focal airspace disease. Overlying support apparatus partially obscure the left chest. IMPRESSION: Worsening pulmonary edema with grossly unchanged cardiomegaly. Electronically Signed   By: Keith Rake M.D.   On: 05/23/2019 23:24    Cardiac Studies   Echocardiogram in June 2020:   1. The left ventricle has normal systolic function with an ejection fraction of 60-65%. The cavity size was normal. Left ventricular diastolic Doppler parameters are consistent with pseudonormalization. Elevated mean left atrial pressure No evidence of  left ventricular regional wall motion abnormalities.  2. The right ventricle has normal systolic function. The cavity was normal. There is no increase in right ventricular wall thickness.  3. Left atrial size was mild-moderately dilated.  4. The aortic valve has an indeterminate number of cusps. Moderate thickening of the aortic valve. Moderate calcification of the aortic valve. Moderate stenosis of the aortic valve.  5. The mitral valve is degenerative. Mild thickening of the mitral valve leaflet. There is severe mitral annular calcification present. No evidence of mitral valve stenosis.  6. The aortic root is normal in size and structure.  7. The interatrial septum was not well visualized.  Patient Profile     73 y.o. 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 Darvin Neighbours  Assessment & Plan    1. Acute on chronic diastolic heart failure: The patient does have grade 2 diastolic dysfunction on most recent echocardiogram and I suspect she requires more fluid removal during dialysis.  I do not think that  her current episode was triggered by an ischemic cardiac event.  I also do not think her aortic stenosis is severe based on most recent echocardiogram from last month.  However, if she continues to have these episodes, I think the best option is to proceed with right and left cardiac catheterization in the near future to fully evaluate this.  2.  Aortic stenosis: Likely in the moderate range although low flow low gradient severe aortic stenosis cannot be completely excluded.  Continue close monitoring of this  with serial echocardiograms.  Next echocardiogram should be done in December or as I mentioned above if she continues to have recurrent episodes of heart failure, this should be fully evaluated with a right and left cardiac catheterization before then.  3.  Mildly elevated troponin: Likely supply demand ischemia.  No chest pain.  No plans for ischemic cardiac work-up at the present time given the lack of anginal symptoms .  4.  Essential hypertension: Blood pressure is reasonably controlled.        For questions or updates, please contact [place] Please consult [email or web address] for contact info under        Signed, Kathlyn Sacramento, MD  05/25/2019, 12:26 PM

## 2019-05-25 NOTE — Progress Notes (Signed)
[facility]  ROUNDING NOTE   Subjective:  Patient underwent hemodialysis yesterday. Tolerated well. Feeling much better today. Currently denying chest pain.    Objective:  Vital signs in last 24 hours:  Temp:  [98.2 F (36.8 C)-98.6 F (37 C)] 98.6 F (37 C) (07/05 0708) Pulse Rate:  [76-97] 76 (07/05 0708) Resp:  [16-20] 18 (07/05 0708) BP: (139-157)/(50-60) 139/53 (07/05 0708) SpO2:  [94 %-100 %] 94 % (07/05 1322) Weight:  [57.2 kg-58.6 kg] 58.6 kg (07/05 0335)  Weight change: -0.5 kg Filed Weights   05/24/19 0930 05/24/19 1548 05/25/19 0335  Weight: 56.9 kg 57.2 kg 58.6 kg    Intake/Output: I/O last 3 completed shifts: In: -  Out: 1429 [Urine:275; ZOXWR:6045]   Intake/Output this shift:  Total I/O In: 723 [P.O.:720; I.V.:3] Out: 150 [Urine:150]  Physical Exam: General: No acute distress  Head: Normocephalic, atraumatic. Moist oral mucosal membranes  Eyes: Anicteric  Neck: Supple, trachea midline  Lungs:  Clear bilateral, normal effort  Heart: S1S2 no rubs  Abdomen:  Soft, nontender, bowel sounds present  Extremities: No peripheral edema.  Neurologic: Awake, alert, following commands  Skin: No lesions  Access: left upper extremity AV fistula    Basic Metabolic Panel: Recent Labs  Lab 05/23/19 2259 05/24/19 0159 05/24/19 0413 05/25/19 0417  NA 139  --   --  141  K 4.0  --   --  4.3  CL 97*  --   --  100  CO2 28  --   --  28  GLUCOSE 208*  --   --  152*  BUN 32*  --   --  35*  CREATININE 5.33* 5.56*  --  4.77*  CALCIUM 7.8*  --   --  8.3*  PHOS  --   --  3.9  --     Liver Function Tests: Recent Labs  Lab 05/23/19 2259  AST 17  ALT 12  ALKPHOS 86  BILITOT 0.7  PROT 6.8  ALBUMIN 4.2   No results for input(s): LIPASE, AMYLASE in the last 168 hours. No results for input(s): AMMONIA in the last 168 hours.  CBC: Recent Labs  Lab 05/23/19 2259 05/24/19 0159 05/24/19 0413  WBC 14.7* 12.2* 9.5  NEUTROABS 12.4*  --  8.8*   HGB 9.8* 9.7* 9.6*  HCT 30.8* 30.1* 30.0*  MCV 93.1 92.3 92.6  PLT 182 125* 125*    Cardiac Enzymes: No results for input(s): CKTOTAL, CKMB, CKMBINDEX, TROPONINI in the last 168 hours.  BNP: Invalid input(s): POCBNP  CBG: Recent Labs  Lab 05/24/19 1406 05/24/19 1607 05/24/19 2141 05/25/19 0710 05/25/19 1123  GLUCAP 117* 209* 188* 116* 118*    Microbiology: Results for orders placed or performed during the hospital encounter of 05/23/19  SARS Coronavirus 2 (CEPHEID - Performed in [place] hospital lab), Hosp Order     Status: None   Collection Time: 05/23/19 11:00 PM   Specimen: Nasopharyngeal Swab  Result Value Ref Range Status   SARS Coronavirus 2 NEGATIVE NEGATIVE Final    Comment: (NOTE) If result is NEGATIVE SARS-CoV-2 target nucleic acids are NOT DETECTED. The SARS-CoV-2 RNA is generally detectable in upper and lower  respiratory specimens during the acute phase of infection. The lowest  concentration of SARS-CoV-2 viral copies this assay can detect is 250  copies / mL. A negative result does not preclude SARS-CoV-2 infection  and should not be used as the sole basis for treatment or other  patient management decisions.  A negative result may occur with  improper specimen collection / handling, submission of specimen other  than nasopharyngeal swab, presence of viral mutation(s) within the  areas targeted by this assay, and inadequate number of viral copies  (<250 copies / mL). A negative result must be combined with clinical  observations, patient history, and epidemiological information. If result is POSITIVE SARS-CoV-2 target nucleic acids are DETECTED. The SARS-CoV-2 RNA is generally detectable in upper and lower  respiratory specimens dur ing the acute phase of infection.  Positive  results are indicative of active infection with SARS-CoV-2.  Clinical  correlation with patient history and other diagnostic information is  necessary to determine patient  infection status.  Positive results do  not rule out bacterial infection or co-infection with other viruses. If result is PRESUMPTIVE POSTIVE SARS-CoV-2 nucleic acids MAY BE PRESENT.   A presumptive positive result was obtained on the submitted specimen  and confirmed on repeat testing.  While 2019 novel coronavirus  (SARS-CoV-2) nucleic acids may be present in the submitted sample  additional confirmatory testing may be necessary for epidemiological  and / or clinical management purposes  to differentiate between  SARS-CoV-2 and other Sarbecovirus currently known to infect humans.  If clinically indicated additional testing with an alternate test  methodology [phone]) is advised. The SARS-CoV-2 RNA is generally  detectable in upper and lower respiratory sp ecimens during the acute  phase of infection. The expected result is Negative. Fact Sheet for Patients:  StrictlyIdeas.no Fact Sheet for Healthcare Providers: BankingDealers.co.za This test is not yet approved or cleared by the [place] FDA and has been authorized for detection and/or diagnosis of SARS-CoV-2 by FDA under an Emergency Use Authorization (EUA).  This EUA will remain in effect (meaning this test can be used) for the duration of the COVID-19 declaration under Section 564(b)(1) of the Act, 21 U.S.C. section 360bbb-3(b)(1), unless the authorization is terminated or revoked sooner. Performed at [facility], [address] [place], [place] [address]   Blood culture (routine x 2)     Status: None (Preliminary result)   Collection Time: 05/23/19 11:00 PM   Specimen: BLOOD  Result Value Ref Range Status   Specimen Description BLOOD RIGHT ASSIST CONTROL  Final   Special Requests   Final    BOTTLES DRAWN AEROBIC AND ANAEROBIC Blood Culture adequate volume   Culture   Final    NO GROWTH 2 DAYS Performed at [facility], [address],  [place], [place] [address]    Report Status PENDING  Incomplete  Blood culture (routine x 2)     Status: None (Preliminary result)   Collection Time: 05/23/19 11:01 PM   Specimen: BLOOD  Result Value Ref Range Status   Specimen Description BLOOD RIGHT HAND  Final   Special Requests   Final    BOTTLES DRAWN AEROBIC AND ANAEROBIC Blood Culture results may not be optimal due to an excessive volume of blood received in culture bottles   Culture   Final    NO GROWTH 2 DAYS Performed at [facility], [address] [place], [place] [address]    Report Status PENDING  Incomplete  MRSA PCR Screening     Status: None   Collection Time: 05/24/19  6:38 PM   Specimen: Nasal Mucosa; Nasopharyngeal  Result Value Ref Range Status   MRSA by PCR NEGATIVE NEGATIVE Final    Comment:        The GeneXpert MRSA Assay (FDA approved for  NASAL specimens only), is one component of a comprehensive MRSA colonization surveillance program. It is not intended to diagnose MRSA infection nor to guide or monitor treatment for MRSA infections. Performed at [facility], [address] [place], [place] [address]     Coagulation Studies: No results for input(s): LABPROT, INR in the last 72 hours.  Urinalysis: No results for input(s): COLORURINE, LABSPEC, PHURINE, GLUCOSEU, HGBUR, BILIRUBINUR, KETONESUR, PROTEINUR, UROBILINOGEN, NITRITE, LEUKOCYTESUR in the last 72 hours.  Invalid input(s): APPERANCEUR    Imaging: Dg Chest Port 1 View  Result Date: 05/23/2019 CLINICAL DATA:  Shortness of breath. EXAM: PORTABLE CHEST 1 VIEW COMPARISON:  05/07/2019 FINDINGS: Unchanged cardiomegaly and mediastinal contours. Tortuosity and atherosclerosis of the thoracic aorta. Worsening interstitial and peribronchial thickening consistent with pulmonary edema. No definite pleural fluid or focal airspace disease. Overlying support apparatus partially obscure the left chest. IMPRESSION: Worsening pulmonary edema  with grossly unchanged cardiomegaly. Electronically Signed   By: Keith Rake M.D.   On: 05/23/2019 23:24     Medications:   . sodium chloride     . amLODipine  10 mg Oral Daily  . calcium acetate  667 mg Oral TID WC  . carvedilol  12.5 mg Oral BID WC  . Chlorhexidine Gluconate Cloth  6 each Topical Q0600  . clopidogrel  75 mg Oral Daily  . [START ON 05/27/2019] epoetin (EPOGEN/PROCRIT) injection  4,000 Units Intravenous Q T,Th,Sa-HD  . furosemide  80 mg Intravenous Q12H  . heparin  5,000 Units Subcutaneous Q12H  . hydrALAZINE  25 mg Oral TID  . insulin aspart  0-9 Units Subcutaneous TID WC  . loratadine  10 mg Oral Daily  . losartan  50 mg Oral Daily  . oxybutynin  15 mg Oral Daily  . pantoprazole  40 mg Oral Daily  . pravastatin  40 mg Oral QHS  . predniSONE  40 mg Oral Q breakfast  . sodium chloride flush  3 mL Intravenous Q12H   sodium chloride, acetaminophen, albuterol, cyclobenzaprine, HYDROcodone-acetaminophen, HYDROcodone-acetaminophen, ondansetron (ZOFRAN) IV, ondansetron, sodium chloride flush, topiramate, zolpidem  Assessment/ Plan:  73 y.o. female with end stage renal disease on hemodialysis, hypertension, diabetes mellitus type II, overactive bladder, gout, COPD, congestive heart failure, coronary artery disease  [facility] TTS [address] 58.5kg Left AVF  1. End Stage Renal Disease: on hemodialysis.  Patient underwent her usual dialysis treatment yesterday.  Tolerated well.  No acute indication for dialysis today.  Next Alysis treatment for Tuesday.  2. Hypertension:  Continue amlodipine, carvedilol, losartan, and hydralazine.  3.  Anemia of chronic kidney disease.  Maintain the patient on Epogen with dialysis treatment.  4. Secondary hyperparathyroidism.  Phosphorus under good control at 3.9.  Maintain the patient on calcium acetate 1 tablet p.o. 3 times daily with meals.   LOS: 1 Silvanna Ohmer 7/5/20202:48 PM

## 2019-05-25 NOTE — Progress Notes (Signed)
[facility] NAME: Phyllis [name]    MR#:  [id number]  DATE OF BIRTH:  January 18, 1946  SUBJECTIVE:  CHIEF COMPLAINT:   Chief Complaint  Patient presents with  . Shortness of Breath   SOB better. Still on O2 Had HD yesterday.  REVIEW OF SYSTEMS:    Review of Systems  Constitutional: Positive for malaise/fatigue. Negative for chills and fever.  HENT: Negative for sore throat.   Eyes: Negative for blurred vision, double vision and pain.  Respiratory: Positive for shortness of breath. Negative for cough, hemoptysis and wheezing.   Cardiovascular: Negative for chest pain, palpitations, orthopnea and leg swelling.  Gastrointestinal: Negative for abdominal pain, constipation, diarrhea, heartburn, nausea and vomiting.  Genitourinary: Negative for dysuria and hematuria.  Musculoskeletal: Negative for back pain and joint pain.  Skin: Negative for rash.  Neurological: Negative for sensory change, speech change, focal weakness and headaches.  Endo/Heme/Allergies: Does not bruise/bleed easily.  Psychiatric/Behavioral: Negative for depression. The patient is not nervous/anxious.     DRUG ALLERGIES:   Allergies  Allergen Reactions  . Codeine Nausea And Vomiting and Nausea Only  . Enalapril Maleate Other (See Comments)    Other reaction(s): Headache  . Nitrofurantoin Swelling and Rash    Other Reaction: swelling of body  . Sulfamethoxazole-Trimethoprim Swelling  . Vicodin [Hydrocodone-Acetaminophen] Nausea And Vomiting and Nausea Only  . 2,4-D Dimethylamine (Amisol) Rash and Other (See Comments)    Other Reaction: h/a  . Baclofen Other (See Comments) and Nausea Only    lightheadness ,drowsiness , muscle weakness , twitching in hands   . Neosporin [Neomycin-Bacitracin Zn-Polymyx] Other (See Comments) and Rash    Other Reaction: irritation Skin irritation   . Quinine Nausea And Vomiting, Rash and Other (See Comments)    Other Reaction:  Vomiting, rash, h/a, vision  . Ultram [Tramadol] Palpitations  . Zocor [Simvastatin] Other (See Comments) and Rash    Other Reaction: muscle spasms Muscle pain and spasms  . Bactrim [Sulfamethoxazole-Trimethoprim] Swelling  . Levodopa Other (See Comments)    Reaction: unknown  . Macrodantin [Nitrofurantoin Macrocrystal] Swelling  . Quinine Derivatives Other (See Comments)    Vertigo,nausea vomiting blurred vision headache ears sensitivity     VITALS:  Blood pressure (!) 139/53, pulse 76, temperature 98.6 F (37 C), temperature source Oral, resp. rate 18, height 4\' 10"  (1.473 m), weight 58.6 kg, SpO2 98 %.  PHYSICAL EXAMINATION:   Physical Exam  GENERAL:  73 y.o.-year-old patient lying in the bed with no acute distress.  EYES: Pupils equal, round, reactive to light and accommodation. No scleral icterus. Extraocular muscles intact.  HEENT: Head atraumatic, normocephalic. Oropharynx and nasopharynx clear.  NECK:  Supple, no jugular venous distention. No thyroid enlargement, no tenderness.  LUNGS: Normal breath sounds bilaterally, no wheezing, rales, rhonchi. No use of accessory muscles of respiration.  CARDIOVASCULAR: S1, S2 normal. No murmurs, rubs, or gallops.  ABDOMEN: Soft, nontender, nondistended. Bowel sounds present. No organomegaly or mass.  EXTREMITIES: No cyanosis, clubbing or edema b/l.    NEUROLOGIC: Cranial nerves II through XII are intact. No focal Motor or sensory deficits b/l.   PSYCHIATRIC: The patient is alert and oriented x 3.  SKIN: No obvious rash, lesion, or ulcer.   LABORATORY PANEL:   CBC Recent Labs  Lab 05/24/19 0413  WBC 9.5  HGB 9.6*  HCT 30.0*  PLT 125*   ------------------------------------------------------------------------------------------------------------------ Chemistries  Recent Labs  Lab 05/23/19 2259  05/25/19 0417  NA 139  --  141  K 4.0  --  4.3  CL 97*  --  100  CO2 28  --  28  GLUCOSE 208*  --  152*  BUN 32*  --  35*   CREATININE 5.33*   < > 4.77*  CALCIUM 7.8*  --  8.3*  AST 17  --   --   ALT 12  --   --   ALKPHOS 86  --   --   BILITOT 0.7  --   --    < > = values in this interval not displayed.   ------------------------------------------------------------------------------------------------------------------  Cardiac Enzymes No results for input(s): TROPONINI in the last 168 hours. ------------------------------------------------------------------------------------------------------------------  RADIOLOGY:  Dg Chest Port 1 View  Result Date: 05/23/2019 CLINICAL DATA:  Shortness of breath. EXAM: PORTABLE CHEST 1 VIEW COMPARISON:  05/07/2019 FINDINGS: Unchanged cardiomegaly and mediastinal contours. Tortuosity and atherosclerosis of the thoracic aorta. Worsening interstitial and peribronchial thickening consistent with pulmonary edema. No definite pleural fluid or focal airspace disease. Overlying support apparatus partially obscure the left chest. IMPRESSION: Worsening pulmonary edema with grossly unchanged cardiomegaly. Electronically Signed   By: Keith Rake M.D.   On: 05/23/2019 23:24     ASSESSMENT AND PLAN:   1.  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 Still on O2 and has SOB Will need further fluid removal.  2.  End-stage renal disease on hemodialysis  Will need further HD  3.  Chest pain.  Due to chf has resolved  4.  Hypertension.  Amlodipine, hydralazine and Coreg  5.  GERD.  PPI therapy  6.  DVT prophylaxis.  SQ heparin.   All the records are reviewed and case discussed with Care Management/Social Worker Management plans discussed with the patient, family and they are in agreement.  CODE STATUS: FULL CODE  DVT Prophylaxis: SCDs  TOTAL TIME TAKING CARE OF THIS PATIENT: 35 minutes.   POSSIBLE D/C IN 1-2 DAYS, DEPENDING ON CLINICAL CONDITION.  Leia Alf Gearald Stonebraker M.D on 05/25/2019 at 11:31 AM  Between 7am to 6pm -  Pager - [phone]  After 6pm go to [email or web address] - password [email or web address] [facility]  Office  [phone]  CC: Primary care physician; No primary care provider on file.  Note: This dictation was prepared with Dragon dictation along with smaller phrase technology. Any transcriptional errors that result from this process are unintentional.

## 2019-05-26 LAB — BASIC METABOLIC PANEL
Anion gap: 11 (ref 5–15)
BUN: 61 mg/dL — ABNORMAL HIGH (ref 8–23)
CO2: 29 mmol/L (ref 22–32)
Calcium: 8.2 mg/dL — ABNORMAL LOW (ref 8.9–10.3)
Chloride: 100 mmol/L (ref 98–111)
Creatinine, Ser: 6.48 mg/dL — ABNORMAL HIGH (ref 0.44–1.00)
GFR calc Af Amer: 7 mL/min — ABNORMAL LOW (ref >60)
GFR calc non Af Amer: 6 mL/min — ABNORMAL LOW (ref >60)
Glucose, Bld: 127 mg/dL — ABNORMAL HIGH (ref 70–99)
Potassium: 4.1 mmol/L (ref 3.5–5.1)
Sodium: 140 mmol/L (ref 135–145)

## 2019-05-26 LAB — GLUCOSE, CAPILLARY
Glucose-Capillary: 105 mg/dL — ABNORMAL HIGH (ref 70–99)
Glucose-Capillary: 130 mg/dL — ABNORMAL HIGH (ref 70–99)

## 2019-05-26 NOTE — Progress Notes (Signed)
Pulmonary Rehab Navigator/ Exercise Physiologist Note  CHF Education:??  Educational session with patient completed.  Provided patient with "Living Better with Heart Failure" packet during previous admission, reviewed materials today. Briefly reviewed definition of heart failure and signs and symptoms of an exacerbation. This EP discussed potential causes of CHF.  Explained to patient that HF is a chronic illness which requires self-assessment / self-management along with help from the cardiologist/PCP. This EP discussed definition of EF measurement along with normal value compared to patient's EF  60-65%.?  *Reviewed importance of and reason behind checking weight daily in the AM, after using the bathroom, but before getting dressed.  Patient is weighed and has her fluid managed at Dialysis 3 times per week.  *Reviewed with patient the following information:  *Discussed when to call the Dr= weight gain of >2-3lb overnight of 5lb in a week,  *Discussed yellow zone= call MD: weight gain of >2-3lb overnight of 5lb in a week, increased swelling, increased SOB when lying down, chest discomfort, dizziness, increased fatigue  *Red Zone= call 911: struggle to breath, fainting or near fainting, significant chest pain ?  *Heart Failure Zone Magnet given and reviewed with patient. ?  *Diet - Patient currently ordered heart healthy. Patient has access to a Dietitian at Dialysis and wants to discuss iceberg lettuce with her at her next visit. Patient wonders if the water in iceburg lettuce may have contributed to this exacerbation when we reviewed the 5 Simple Steps and what may have contributed to her fluid gain. Instructed patient to follow a low sodium diet of 2000 mg or less.  Recommended foods for low sodium heart healthy nutrition or heart failure carb modified nutrition therapy discussed. Reviewed with patient steps to reading a food label with close attention to serving size and mg of sodium.  ?  *Discussed fluid intake with patient as well. Patient not currently on a fluid restriction, but advised no more than 64 ounces of fluid per day. Demonstrated this volume to patient using the bedside water pitcher. ?  *Instructed patient to take medications as prescribed for heart failure. Explained briefly why patient is on the medications and discussed monitoring and side effects.?  *Discussed exercise / activity. Patient is sedentary. Patient wants to start PT and HH. Patient has been unable to do so bc of SOB. Encouraged patient to be as active as possible. Patient would benefit from Pulmonary Rehab after PT and [facility] sessions are complete. Patient can do exercises that would not aggravate her costochondritis but would increase her strength, stamina, and stability. Pulmonary Rehab is still closed due to COVID-19 but has starting seeing Cardiac patients.   *Smoking Cessation- Patient is a NEVER smoker.?  *[facility] - Explained the purpose of the HF Clinic. This EP explained to patient the HF Clinic does not replace PCP nor Cardiologist, but is an additional resource to helping patient manage heart failure at home. Patient is followed by HF Clinic and also goes to Dialysis. This EP sent a note to HF Clinic to confirm need for both.  Again, the 5 Steps to Living Better with Heart Failure were reviewed with patient.  Patient thanked me for providing the above information. ?  Jasper Loser, [facility]  Exercise Physiologist Department Phone #: [phone] Fax: [phone]  Direct Line [phone] Email Address: [email or web address]

## 2019-05-26 NOTE — Progress Notes (Signed)
IV and tele removed from patient. Discharge instructions given to patient. Verbalized understanding. No acute distress at this time. Patient to call her ride after lunch.

## 2019-05-26 NOTE — Discharge Summary (Signed)
[facility] NAME: Phyllis [name]    MR#:  [id number]  DATE OF BIRTH:  05-12-1946  DATE OF ADMISSION:  05/23/2019 ADMITTING PHYSICIAN: Christel Mormon, MD  DATE OF DISCHARGE: May 26, 2019  PRIMARY CARE PHYSICIAN: [name], MD    ADMISSION DIAGNOSIS:  ESRD (end stage renal disease) on dialysis ([place]) [N18.6, Z99.2] Acute on chronic respiratory failure, unspecified whether with hypoxia or hypercapnia (HCC) [J96.20] Chest pain, unspecified type [R07.9] Acute on chronic congestive heart failure, unspecified heart failure type ([place]) [I50.9]  DISCHARGE DIAGNOSIS:  Active Problems:   Acute CHF (congestive heart failure) ([place])   SECONDARY DIAGNOSIS:   Past Medical History:  Diagnosis Date  . (HFpEF) heart failure with preserved ejection fraction ([place])    a. TTE 01/2014: nl LV sys fxn, no valvular abnormalities; b. TTE 11/16: nl EF, mild LVH;  c. 04/2019 Echo: EF 60-65%. DD. Nl RV fxn. Mod AS. 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  . Allergy   . Anemia of chronic disease   . Anxiety   . Aortic atherosclerosis ([place])   . Asthma   . Chronic back pain   . COPD (chronic obstructive pulmonary disease) ([place])   . Diabetes mellitus with complication ([place])   . ESRD on hemodialysis ([place])    a. Tues/Sat; b. 2/2 small kidneys  . Essential hypertension   . Fistula    lower left arm  . GERD (gastroesophageal reflux disease)   . Gout   . History of exercise stress test    a. 01/2014: no evidence of ischemia; b. Lexiscan 08/2015: no sig ischemia, severe GI uptake artifact, low risk; c. CPET @ [facility] 09/2016: exercised 3 min 12 sec on bike without incline, 2.28 METs, VO2 of 8.1, 48% of predicted, indicating mod to sev functional impairment, evidence of blunted HR, stroke volume, and BP augmentation as well as ventilation-perfusion mismatch with exercise  . HLD (hyperlipidemia)   . Moderate aortic stenosis    a. 04/2019 Echo: Mod AS.   . Non-obstructive Carotid arterial disease ([place])    a. 12/2017: <50% bilat ICA dzs.  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 Sleep apnea     HOSPITAL COURSE:   73 year old female with chronic diastolic heart failure, moderate aortic stenosis and end-stage renal disease on hemodialysis who presented to the emergency room due to shortness of breath.  1.  Acute hypoxic respiratory failure in the setting of acute on chronic diastolic heart failure preserved ejection fraction: Patient has been weaned off of oxygen.  2.  Acute on chronic diastolic heart failure with preserved ejection fraction: Patient has grade 2 diastolic dysfunction on most recent echocardiogram.  She was evaluated by cardiology.  Etiology of heart failure is due to underlying end-stage renal disease with fluid overload.   After proceeding with dialysis her shortness of breath has resolved.  3.  Aortic stenosis, moderate: Patient with outpatient follow-up.  Next echocardiogram should be done in December.  4.  Elevated troponin in the setting of demand ischemia.  Patient was ruled out for ACS  5.  End-stage renal disease on hemodialysis: Patient had dialysis due to shortness of breath while in the hospital.  She will resume her outpatient schedule which includes Tuesday, Thursday and Saturday.  6.  Essential hypertension: Patient will continue Coreg, losartan  DISCHARGE CONDITIONS AND DIET:   Stable for discharge renal diet  CONSULTS OBTAINED:  Treatment  Team:  Wellington Hampshire, MD  DRUG ALLERGIES:   Allergies  Allergen Reactions  . Codeine Nausea And Vomiting and Nausea Only  . Enalapril Maleate Other (See Comments)    Other reaction(s): Headache  . Nitrofurantoin Swelling and Rash    Other Reaction: swelling of body  . Sulfamethoxazole-Trimethoprim Swelling  . Vicodin [Hydrocodone-Acetaminophen] Nausea And Vomiting and Nausea Only  . 2,4-D Dimethylamine (Amisol) Rash and Other (See  Comments)    Other Reaction: h/a  . Baclofen Other (See Comments) and Nausea Only    lightheadness ,drowsiness , muscle weakness , twitching in hands   . Neosporin [Neomycin-Bacitracin Zn-Polymyx] Other (See Comments) and Rash    Other Reaction: irritation Skin irritation   . Quinine Nausea And Vomiting, Rash and Other (See Comments)    Other Reaction: Vomiting, rash, h/a, vision  . Ultram [Tramadol] Palpitations  . Zocor [Simvastatin] Other (See Comments) and Rash    Other Reaction: muscle spasms Muscle pain and spasms  . Bactrim [Sulfamethoxazole-Trimethoprim] Swelling  . Levodopa Other (See Comments)    Reaction: unknown  . Macrodantin [Nitrofurantoin Macrocrystal] Swelling  . Quinine Derivatives Other (See Comments)    Vertigo,nausea vomiting blurred vision headache ears sensitivity     DISCHARGE MEDICATIONS:   Allergies as of 05/26/2019      Reactions   Codeine Nausea And Vomiting, Nausea Only   Enalapril Maleate Other (See Comments)   Other reaction(s): Headache   Nitrofurantoin Swelling, Rash   Other Reaction: swelling of body   Sulfamethoxazole-trimethoprim Swelling   Vicodin [hydrocodone-acetaminophen] Nausea And Vomiting, Nausea Only   2,4-d Dimethylamine (amisol) Rash, Other (See Comments)   Other Reaction: h/a   Baclofen Other (See Comments), Nausea Only   lightheadness ,drowsiness , muscle weakness , twitching in hands    Neosporin [neomycin-bacitracin Zn-polymyx] Other (See Comments), Rash   Other Reaction: irritation Skin irritation   Quinine Nausea And Vomiting, Rash, Other (See Comments)   Other Reaction: Vomiting, rash, h/a, vision   Ultram [tramadol] Palpitations   Zocor [simvastatin] Other (See Comments), Rash   Other Reaction: muscle spasms Muscle pain and spasms   Bactrim [sulfamethoxazole-trimethoprim] Swelling   Levodopa Other (See Comments)   Reaction: unknown   Macrodantin [nitrofurantoin Macrocrystal] Swelling   Quinine Derivatives Other  (See Comments)   Vertigo,nausea vomiting blurred vision headache ears sensitivity      Medication List    TAKE these medications   albuterol 108 (90 Base) MCG/ACT inhaler Commonly known as: VENTOLIN HFA Inhale 2 puffs into the lungs every 6 (six) hours as needed for wheezing or shortness of breath.   albuterol (2.5 MG/3ML) 0.083% nebulizer solution Commonly known as: PROVENTIL Take 3 mLs (2.5 mg total) by nebulization every 6 (six) hours as needed for wheezing or shortness of breath.   amLODipine 10 MG tablet Commonly known as: NORVASC Take 10 mg by mouth.   AneCream 4 % cream Generic drug: lidocaine APPLY TOPICALLY 2 (TWO) TIMES DAILY FOR 30 DAYS   calcium acetate 667 MG capsule Commonly known as: PHOSLO Take 667 mg by mouth 3 (three) times daily with meals.   carvedilol 12.5 MG tablet Commonly known as: COREG Take 12.5 mg by mouth 2 (two) times daily with a meal.   cetirizine 10 MG tablet Commonly known as: ZYRTEC Take 10 mg by mouth at bedtime.   clopidogrel 75 MG tablet Commonly known as: PLAVIX Take 1 tablet (75 mg total) by mouth daily.   cyclobenzaprine 10 MG tablet Commonly known as: FLEXERIL Take 10  mg by mouth daily as needed for muscle spasms.   Fluticasone-Salmeterol 250-50 MCG/DOSE Aepb Commonly known as: ADVAIR Inhale 1 puff into the lungs 2 (two) times daily as needed (for shortness of breath).   furosemide 80 MG tablet Commonly known as: LASIX Take 1 tablet (80 mg total) by mouth daily.   hydrALAZINE 25 MG tablet Commonly known as: APRESOLINE Take 25 mg by mouth 3 (three) times daily.   HYDROcodone-acetaminophen 7.5-325 MG tablet Commonly known as: NORCO Take 1-2 tablets by mouth daily as needed (pain).   lidocaine-prilocaine cream Commonly known as: EMLA Apply 1 application topically as needed (prior to treatment).   losartan 50 MG tablet Commonly known as: COZAAR Take 50 mg by mouth daily.   omeprazole 20 MG capsule Commonly known  as: PRILOSEC Take 20 mg by mouth 2 (two) times daily before a meal.   ondansetron 4 MG tablet Commonly known as: ZOFRAN Take 4 mg by mouth every 8 (eight) hours as needed.   oxybutynin 15 MG 24 hr tablet Commonly known as: DITROPAN XL Take 15 mg by mouth daily.   pravastatin 40 MG tablet Commonly known as: PRAVACHOL Take 40 mg by mouth at bedtime.   predniSONE 20 MG tablet Commonly known as: DELTASONE Take 40 mg by mouth daily with breakfast.   topiramate 25 MG tablet Commonly known as: TOPAMAX Take 25 mg by mouth as needed (for headaches).         Today   CHIEF COMPLAINT:  Patient doing well and ready to be discharged home today.   VITAL SIGNS:  Blood pressure (!) 141/56, pulse 69, temperature 98.3 F (36.8 C), temperature source Oral, resp. rate 18, height 4\' 10"  (1.473 m), weight 59 kg, SpO2 99 %.   REVIEW OF SYSTEMS:  Review of Systems  Constitutional: Negative.  Negative for chills, fever and malaise/fatigue.  HENT: Negative.  Negative for ear discharge, ear pain, hearing loss, nosebleeds and sore throat.   Eyes: Negative.  Negative for blurred vision and pain.  Respiratory: Negative.  Negative for cough, hemoptysis, shortness of breath and wheezing.   Cardiovascular: Negative.  Negative for chest pain, palpitations and leg swelling.  Gastrointestinal: Negative.  Negative for abdominal pain, blood in stool, diarrhea, nausea and vomiting.  Genitourinary: Negative.  Negative for dysuria.  Musculoskeletal: Negative.  Negative for back pain.  Skin: Negative.   Neurological: Negative for dizziness, tremors, speech change, focal weakness, seizures and headaches.  Endo/Heme/Allergies: Negative.  Does not bruise/bleed easily.  Psychiatric/Behavioral: Negative.  Negative for depression, hallucinations and suicidal ideas.     PHYSICAL EXAMINATION:  GENERAL:  73 y.o.-year-old patient lying in the bed with no acute distress.  NECK:  Supple, no jugular venous  distention. No thyroid enlargement, no tenderness.  LUNGS: Normal breath sounds bilaterally, no wheezing, rales,rhonchi  No use of accessory muscles of respiration.  CARDIOVASCULAR: S1, S2 normal. 2/6 murmurs, rubs, or gallops.  ABDOMEN: Soft, non-tender, non-distended. Bowel sounds present. No organomegaly or mass.  EXTREMITIES: No pedal edema, cyanosis, or clubbing.  PSYCHIATRIC: The patient is alert and oriented x 3.  SKIN: No obvious rash, lesion, or ulcer.   DATA REVIEW:   CBC Recent Labs  Lab 05/24/19 0413  WBC 9.5  HGB 9.6*  HCT 30.0*  PLT 125*    Chemistries  Recent Labs  Lab 05/23/19 2259  05/26/19 0431  NA 139   < > 140  K 4.0   < > 4.1  CL 97*   < > 100  CO2 28   < > 29  GLUCOSE 208*   < > 127*  BUN 32*   < > 61*  CREATININE 5.33*   < > 6.48*  CALCIUM 7.8*   < > 8.2*  AST 17  --   --   ALT 12  --   --   ALKPHOS 86  --   --   BILITOT 0.7  --   --    < > = values in this interval not displayed.    Cardiac Enzymes No results for input(s): TROPONINI in the last 168 hours.  Microbiology Results  @MICRORSLT48 @  RADIOLOGY:  No results found.    Allergies as of 05/26/2019      Reactions   Codeine Nausea And Vomiting, Nausea Only   Enalapril Maleate Other (See Comments)   Other reaction(s): Headache   Nitrofurantoin Swelling, Rash   Other Reaction: swelling of body   Sulfamethoxazole-trimethoprim Swelling   Vicodin [hydrocodone-acetaminophen] Nausea And Vomiting, Nausea Only   2,4-d Dimethylamine (amisol) Rash, Other (See Comments)   Other Reaction: h/a   Baclofen Other (See Comments), Nausea Only   lightheadness ,drowsiness , muscle weakness , twitching in hands    Neosporin [neomycin-bacitracin Zn-polymyx] Other (See Comments), Rash   Other Reaction: irritation Skin irritation   Quinine Nausea And Vomiting, Rash, Other (See Comments)   Other Reaction: Vomiting, rash, h/a, vision   Ultram [tramadol] Palpitations   Zocor [simvastatin] Other (See  Comments), Rash   Other Reaction: muscle spasms Muscle pain and spasms   Bactrim [sulfamethoxazole-trimethoprim] Swelling   Levodopa Other (See Comments)   Reaction: unknown   Macrodantin [nitrofurantoin Macrocrystal] Swelling   Quinine Derivatives Other (See Comments)   Vertigo,nausea vomiting blurred vision headache ears sensitivity      Medication List    TAKE these medications   albuterol 108 (90 Base) MCG/ACT inhaler Commonly known as: VENTOLIN HFA Inhale 2 puffs into the lungs every 6 (six) hours as needed for wheezing or shortness of breath.   albuterol (2.5 MG/3ML) 0.083% nebulizer solution Commonly known as: PROVENTIL Take 3 mLs (2.5 mg total) by nebulization every 6 (six) hours as needed for wheezing or shortness of breath.   amLODipine 10 MG tablet Commonly known as: NORVASC Take 10 mg by mouth.   AneCream 4 % cream Generic drug: lidocaine APPLY TOPICALLY 2 (TWO) TIMES DAILY FOR 30 DAYS   calcium acetate 667 MG capsule Commonly known as: PHOSLO Take 667 mg by mouth 3 (three) times daily with meals.   carvedilol 12.5 MG tablet Commonly known as: COREG Take 12.5 mg by mouth 2 (two) times daily with a meal.   cetirizine 10 MG tablet Commonly known as: ZYRTEC Take 10 mg by mouth at bedtime.   clopidogrel 75 MG tablet Commonly known as: PLAVIX Take 1 tablet (75 mg total) by mouth daily.   cyclobenzaprine 10 MG tablet Commonly known as: FLEXERIL Take 10 mg by mouth daily as needed for muscle spasms.   Fluticasone-Salmeterol 250-50 MCG/DOSE Aepb Commonly known as: ADVAIR Inhale 1 puff into the lungs 2 (two) times daily as needed (for shortness of breath).   furosemide 80 MG tablet Commonly known as: LASIX Take 1 tablet (80 mg total) by mouth daily.   hydrALAZINE 25 MG tablet Commonly known as: APRESOLINE Take 25 mg by mouth 3 (three) times daily.   HYDROcodone-acetaminophen 7.5-325 MG tablet Commonly known as: NORCO Take 1-2 tablets by mouth daily  as needed (pain).   lidocaine-prilocaine cream Commonly known as:  EMLA Apply 1 application topically as needed (prior to treatment).   losartan 50 MG tablet Commonly known as: COZAAR Take 50 mg by mouth daily.   omeprazole 20 MG capsule Commonly known as: PRILOSEC Take 20 mg by mouth 2 (two) times daily before a meal.   ondansetron 4 MG tablet Commonly known as: ZOFRAN Take 4 mg by mouth every 8 (eight) hours as needed.   oxybutynin 15 MG 24 hr tablet Commonly known as: DITROPAN XL Take 15 mg by mouth daily.   pravastatin 40 MG tablet Commonly known as: PRAVACHOL Take 40 mg by mouth at bedtime.   predniSONE 20 MG tablet Commonly known as: DELTASONE Take 40 mg by mouth daily with breakfast.   topiramate 25 MG tablet Commonly known as: TOPAMAX Take 25 mg by mouth as needed (for headaches).         Management plans discussed with the patient and dhe is in agreement. Stable for discharge   Patient should follow up with pcp  CODE STATUS:     Code Status Orders  (From admission, onward)         Start     Ordered   05/24/19 0128  Full code  Continuous     05/24/19 0134        Code Status History    Date Active Date Inactive Code Status Order ID Comments User Context   05/05/2019 0152 05/08/2019 2121 Full Code [id number]  Lance Coon, MD Inpatient   11/29/2018 0554 11/29/2018 2201 Full Code [id number]  Lance Coon, MD ED   10/21/2018 2235 10/23/2018 1643 Full Code [id number]  Lance Coon, MD Inpatient   03/16/2018 0806 03/17/2018 1637 Full Code [id number]  Loletha Grayer, MD ED   01/16/2018 1630 01/18/2018 2158 Full Code [id number]  Dustin Flock, MD Inpatient   01/16/2018 1340 01/16/2018 1630 DNR [id number]  Dustin Flock, MD ED   09/18/2017 1348 09/19/2017 1513 DNR [id number]  Loletha Grayer, MD ED   04/12/2015 1056 04/12/2015 1631 Full Code [id number]  Dew, Erskine Squibb, MD Inpatient   [facility] Care Planning Activity      TOTAL TIME TAKING CARE OF THIS PATIENT: 38  minutes.    Note: This dictation was prepared with Dragon dictation along with smaller phrase technology. Any transcriptional errors that result from this process are unintentional.  Bettey Costa M.D on 05/26/2019 at 10:29 AM  Between 7am to 6pm - Pager - [phone] After 6pm go to [email or web address] - password [email or web address]  Office  [phone]  CC: Primary care physician; [name], MD

## 2019-05-26 NOTE — TOC Transition Note (Addendum)
Transition of Care [facility]) - CM/SW Discharge Note   Patient Details  Name: Phyllis [name] MRN: [id number] Date of Birth: 09/27/1946  Transition of [name]) CM/SW Contact:  Ross Ludwig, LCSW Phone Number: 05/26/2019, 11:55 AM   Clinical Narrative:     Patient is already active with [facility], CSW contacted Melissa at [facility], she is aware that patient is discharging today.  Orders have been put in place for home health nursing, aide, and PT.   Final next level of care: [place] Barriers to Discharge: No Barriers Identified   Patient Goals and CMS Choice Patient states their goals for this hospitalization and ongoing recovery are:: To return back home with home health. CMS Medicare.gov Compare Post Acute Care list provided to:: Patient Choice offered to / list presented to : Patient  Discharge Placement  Patient will be discharging back home with home health PT, Nursing, Aide.  No other anticipated needs.                   Discharge Plan and Services                DME Arranged: N/A DME Agency: NA       HH Arranged: RN, PT, Nurse's Aide [place] Agency: [place] ([facility]) Date HH Agency Contacted: 05/26/19 Time [place]: [id number] Representative spoke with at [place]: [place] (Lup[place]) Interventions     Readmission Risk Interventions Readmission Risk Prevention Plan 05/07/2019  Transportation Screening Complete  Medication Review Press photographer) Complete  PCP or Specialist appointment within 3-5 days of discharge Complete  HRI or [place] Not Applicable  Some recent data might be hidden

## 2019-05-26 NOTE — Plan of Care (Signed)
  Problem: Clinical Measurements: Goal: Respiratory complications will improve Outcome: Progressing   

## 2019-05-26 NOTE — Progress Notes (Signed)
[facility]  ROUNDING NOTE   Subjective:   Seated in chair. No complaints  Objective:  Vital signs in last 24 hours:  Temp:  [98.2 F (36.8 C)-98.9 F (37.2 C)] 98.3 F (36.8 C) (07/06 0716) Pulse Rate:  [69-80] 69 (07/06 0716) Resp:  [18-20] 18 (07/06 0716) BP: (132-141)/(48-56) 141/56 (07/06 0716) SpO2:  [94 %-99 %] 99 % (07/06 0716) Weight:  [59 kg] 59 kg (07/06 0440)  Weight change: 2.1 kg Filed Weights   05/24/19 1548 05/25/19 0335 05/26/19 0440  Weight: 57.2 kg 58.6 kg 59 kg    Intake/Output: I/O last 3 completed shifts: In: 963 [P.O.:960; I.V.:3] Out: 325 [Urine:325]   Intake/Output this shift:  No intake/output data recorded.  Physical Exam: General: No acute distress  Head: Normocephalic, atraumatic. Moist oral mucosal membranes  Eyes: Anicteric  Neck: Supple, trachea midline  Lungs:  Clear bilateral, normal effort  Heart: S1S2 no rubs  Abdomen:  Soft, nontender, bowel sounds present  Extremities: No peripheral edema.  Neurologic: Awake, alert, following commands  Skin: No lesions  Access: left upper extremity AV fistula    Basic Metabolic Panel: Recent Labs  Lab 05/23/19 2259 05/24/19 0159 05/24/19 0413 05/25/19 0417 05/26/19 0431  NA 139  --   --  141 140  K 4.0  --   --  4.3 4.1  CL 97*  --   --  100 100  CO2 28  --   --  28 29  GLUCOSE 208*  --   --  152* 127*  BUN 32*  --   --  35* 61*  CREATININE 5.33* 5.56*  --  4.77* 6.48*  CALCIUM 7.8*  --   --  8.3* 8.2*  PHOS  --   --  3.9  --   --     Liver Function Tests: Recent Labs  Lab 05/23/19 2259  AST 17  ALT 12  ALKPHOS 86  BILITOT 0.7  PROT 6.8  ALBUMIN 4.2   No results for input(s): LIPASE, AMYLASE in the last 168 hours. No results for input(s): AMMONIA in the last 168 hours.  CBC: Recent Labs  Lab 05/23/19 2259 05/24/19 0159 05/24/19 0413  WBC 14.7* 12.2* 9.5  NEUTROABS 12.4*  --  8.8*  HGB 9.8* 9.7* 9.6*  HCT 30.8* 30.1* 30.0*  MCV 93.1 92.3 92.6   PLT 182 125* 125*    Cardiac Enzymes: No results for input(s): CKTOTAL, CKMB, CKMBINDEX, TROPONINI in the last 168 hours.  BNP: Invalid input(s): POCBNP  CBG: Recent Labs  Lab 05/25/19 1123 05/25/19 1620 05/25/19 2053 05/26/19 0714 05/26/19 1145  GLUCAP 118* 200* 182* 105* 130*    Microbiology: Results for orders placed or performed during the hospital encounter of 05/23/19  SARS Coronavirus 2 (CEPHEID - Performed in [facility] hospital lab), Hosp Order     Status: None   Collection Time: 05/23/19 11:00 PM   Specimen: Nasopharyngeal Swab  Result Value Ref Range Status   SARS Coronavirus 2 NEGATIVE NEGATIVE Final    Comment: (NOTE) If result is NEGATIVE SARS-CoV-2 target nucleic acids are NOT DETECTED. The SARS-CoV-2 RNA is generally detectable in upper and lower  respiratory specimens during the acute phase of infection. The lowest  concentration of SARS-CoV-2 viral copies this assay can detect is 250  copies / mL. A negative result does not preclude SARS-CoV-2 infection  and should not be used as the sole basis for treatment or other  patient management decisions.  A negative result  may occur with  improper specimen collection / handling, submission of specimen other  than nasopharyngeal swab, presence of viral mutation(s) within the  areas targeted by this assay, and inadequate number of viral copies  (<250 copies / mL). A negative result must be combined with clinical  observations, patient history, and epidemiological information. If result is POSITIVE SARS-CoV-2 target nucleic acids are DETECTED. The SARS-CoV-2 RNA is generally detectable in upper and lower  respiratory specimens dur ing the acute phase of infection.  Positive  results are indicative of active infection with SARS-CoV-2.  Clinical  correlation with patient history and other diagnostic information is  necessary to determine patient infection status.  Positive results do  not rule out bacterial  infection or co-infection with other viruses. If result is PRESUMPTIVE POSTIVE SARS-CoV-2 nucleic acids MAY BE PRESENT.   A presumptive positive result was obtained on the submitted specimen  and confirmed on repeat testing.  While 2019 novel coronavirus  (SARS-CoV-2) nucleic acids may be present in the submitted sample  additional confirmatory testing may be necessary for epidemiological  and / or clinical management purposes  to differentiate between  SARS-CoV-2 and other Sarbecovirus currently known to infect humans.  If clinically indicated additional testing with an alternate test  methodology [phone]) is advised. The SARS-CoV-2 RNA is generally  detectable in upper and lower respiratory sp ecimens during the acute  phase of infection. The expected result is Negative. Fact Sheet for Patients:  StrictlyIdeas.no Fact Sheet for Healthcare Providers: BankingDealers.co.za This test is not yet approved or cleared by the [place] FDA and has been authorized for detection and/or diagnosis of SARS-CoV-2 by FDA under an Emergency Use Authorization (EUA).  This EUA will remain in effect (meaning this test can be used) for the duration of the COVID-19 declaration under Section 564(b)(1) of the Act, 21 U.S.C. section 360bbb-3(b)(1), unless the authorization is terminated or revoked sooner. Performed at [facility], [address] [place], [place] [address]   Blood culture (routine x 2)     Status: None (Preliminary result)   Collection Time: 05/23/19 11:00 PM   Specimen: BLOOD  Result Value Ref Range Status   Specimen Description BLOOD RIGHT ASSIST CONTROL  Final   Special Requests   Final    BOTTLES DRAWN AEROBIC AND ANAEROBIC Blood Culture adequate volume   Culture   Final    NO GROWTH 3 DAYS Performed at [facility], [address] [place], [place] [address]    Report Status PENDING  Incomplete  Blood  culture (routine x 2)     Status: None (Preliminary result)   Collection Time: 05/23/19 11:01 PM   Specimen: BLOOD  Result Value Ref Range Status   Specimen Description BLOOD RIGHT HAND  Final   Special Requests   Final    BOTTLES DRAWN AEROBIC AND ANAEROBIC Blood Culture results may not be optimal due to an excessive volume of blood received in culture bottles   Culture   Final    NO GROWTH 3 DAYS Performed at [facility], [address] [place], [place] [address]    Report Status PENDING  Incomplete  MRSA PCR Screening     Status: None   Collection Time: 05/24/19  6:38 PM   Specimen: Nasal Mucosa; Nasopharyngeal  Result Value Ref Range Status   MRSA by PCR NEGATIVE NEGATIVE Final    Comment:        The GeneXpert MRSA Assay (FDA approved for NASAL specimens only),  is one component of a comprehensive MRSA colonization surveillance program. It is not intended to diagnose MRSA infection nor to guide or monitor treatment for MRSA infections. Performed at [facility], [address] [place], [place] [address]     Coagulation Studies: No results for input(s): LABPROT, INR in the last 72 hours.  Urinalysis: No results for input(s): COLORURINE, LABSPEC, PHURINE, GLUCOSEU, HGBUR, BILIRUBINUR, KETONESUR, PROTEINUR, UROBILINOGEN, NITRITE, LEUKOCYTESUR in the last 72 hours.  Invalid input(s): APPERANCEUR    Imaging: No results found.   Medications:   . sodium chloride     . amLODipine  10 mg Oral Daily  . calcium acetate  667 mg Oral TID WC  . carvedilol  12.5 mg Oral BID WC  . Chlorhexidine Gluconate Cloth  6 each Topical Q0600  . clopidogrel  75 mg Oral Daily  . [START ON 05/27/2019] epoetin (EPOGEN/PROCRIT) injection  4,000 Units Intravenous Q T,Th,Sa-HD  . furosemide  80 mg Intravenous Q12H  . heparin  5,000 Units Subcutaneous Q12H  . hydrALAZINE  25 mg Oral TID  . insulin aspart  0-9 Units Subcutaneous TID WC  . loratadine  10 mg Oral Daily   . losartan  50 mg Oral Daily  . oxybutynin  15 mg Oral Daily  . pantoprazole  40 mg Oral Daily  . pravastatin  40 mg Oral QHS  . predniSONE  40 mg Oral Q breakfast  . sodium chloride flush  3 mL Intravenous Q12H   sodium chloride, acetaminophen, albuterol, cyclobenzaprine, HYDROcodone-acetaminophen, ondansetron (ZOFRAN) IV, ondansetron, sodium chloride flush, topiramate, zolpidem  Assessment/ Plan:  73 y.o. female with end stage renal disease on hemodialysis, hypertension, diabetes mellitus type II, overactive bladder, gout, COPD, congestive heart failure, coronary artery disease  [facility] TTS [address] 58.5kg Left AVF  1. End Stage Renal Disease: on hemodialysis.  TTS schedule  2. Hypertension:  Continue amlodipine, carvedilol, losartan, and hydralazine.  3.  Anemia of chronic kidney disease.   EPO with HD treatment  4. Secondary hyperparathyroidism.      - calcium acetate 1 tablet p.o. 3 times daily with meals.   LOS: 2 Phyllis [name] 7/6/20201:18 PM

## 2019-05-28 LAB — CULTURE, BLOOD (ROUTINE X 2)
Culture: NO GROWTH
Culture: NO GROWTH
Special Requests: ADEQUATE

## 2019-05-31 NOTE — Progress Notes (Signed)
Cardiology Office Note  Date:  06/02/2019   ID:  Phyllis [name], DOB 1946-04-17, MRN [id number]  PCP:  [name], MD   Chief Complaint  Patient presents with  . other    Hospital follow up. Patient c/o Chest pain. Meds reviewed verbally with patient.     HPI:  [name] is a very pleasant 73 year old woman with history of  chronic renal failure, on hemodialysis 3 days per week  diffuse aortic atherosclerosis seen on CT scan 2014,  obesity,  hypertension,  hyperlipidemia  Aortic valve stenosis, moderate, mean gradient 25 mm Hg who presents for follow-up of her hypertension, atherosclerosis, episodes of chest pain.  On HD two day a week , tues , Thursday and Sat <3 hours  In the emergency room February 2020 for chest pain Felt to be atypical in nature  Admitted to the hospital May 04, 2019 for shortness of breath Shortness of breath felt secondary to anemia, physical deconditioning, aortic valve stenosis, Fluid status managed by hemodialysis  Readmitted to the hospital May 23, 2019 for shortness of breath Had dialysis, lasix IV  She is telling HD to pull more Followed by Dr. Holley Raring Dry weight 57.8 kg Used to be higher before   Takes lasix 80 daily Recently BP elevated  HCT 30  Echo 04/2019 The left ventricle has normal systolic function with an ejection fraction of 60-65%. The cavity size was normal.  2. The right ventricle has normal systolic function.   3. Left atrial size was mild-moderately dilated.  4. The aortic valve  Moderate stenosis of the aortic valve.  EKG on today's visit shows normal sinus rhythm with rate 66 bpm, nonspecific T wave changes  Other past medical history reviewed  Stress test 09/2017 Showing no significant ischemia Normal wall motion, EF estimated at 52% Low risk scan  Previous CT abd in 2014: Diffuse athero in aorta  admission to the hospital 12/04/2014 for acute respiratory failure with hypoxia, found to  have acute on chronic renal failure, 25 pound weight gain  AV fistula was not working on the left and temporary dialysis catheter was placed and dialysis performed 3 in the hospital.  stress test 02/12/2014 showing no ischemia echocardiogram March 2015 showing normal LV function, no significant valve problems  PMH:   has a past medical history of (HFpEF) heart failure with preserved ejection fraction ([place]), Allergy, Anemia of chronic disease, Anxiety, Aortic atherosclerosis ([place]), Asthma, Chronic back pain, COPD (chronic obstructive pulmonary disease) ([place]), Diabetes mellitus with complication ([place]), ESRD on hemodialysis ([place]), Essential hypertension, Fistula, GERD (gastroesophageal reflux disease), Gout, History of exercise stress test, HLD (hyperlipidemia), Moderate aortic stenosis, Non-obstructive Carotid arterial disease ([place]), 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, and Sleep apnea.  PSH:    Past Surgical History:  Procedure Laterality Date  . carpel tunnel    . GALLBLADDER SURGERY    . PERIPHERAL VASCULAR CATHETERIZATION N/A 04/12/2015   Procedure: A/V Shuntogram/Fistulagram;  Surgeon: Algernon Huxley, MD;  Location: [facility];  Service: Cardiovascular;  Laterality: N/A;  . PERIPHERAL VASCULAR CATHETERIZATION N/A 04/12/2015   Procedure: A/V Shunt Intervention;  Surgeon: Algernon Huxley, MD;  Location: [facility];  Service: Cardiovascular;  Laterality: N/A;  . PERIPHERAL VASCULAR CATHETERIZATION N/A 06/09/2015   Procedure: Dialysis/Perma Catheter Removal;  Surgeon: Katha Cabal, MD;  Location: [facility];  Service: Cardiovascular;  Laterality: N/A;    Current Outpatient Medications  Medication Sig Dispense Refill  . albuterol (PROVENTIL  HFA;VENTOLIN HFA) 108 (90 Base) MCG/ACT inhaler Inhale 2 puffs into the lungs every 6 (six) hours as needed for wheezing or shortness of breath.    Marland Kitchen albuterol (PROVENTIL) (2.5 MG/3ML) 0.083% nebulizer solution Take 3 mLs  (2.5 mg total) by nebulization every 6 (six) hours as needed for wheezing or shortness of breath. 75 mL 1  . amLODipine (NORVASC) 10 MG tablet Take 10 mg by mouth.     . ANECREAM 4 % cream APPLY TOPICALLY 2 (TWO) TIMES DAILY FOR 30 DAYS    . calcium acetate (PHOSLO) 667 MG capsule Take 667 mg by mouth 3 (three) times daily with meals.     . carvedilol (COREG) 12.5 MG tablet Take 12.5 mg by mouth 2 (two) times daily with a meal.     . cetirizine (ZYRTEC) 10 MG tablet Take 10 mg by mouth at bedtime.     . clopidogrel (PLAVIX) 75 MG tablet Take 1 tablet (75 mg total) by mouth daily. 30 tablet 0  . cyclobenzaprine (FLEXERIL) 10 MG tablet Take 10 mg by mouth daily as needed for muscle spasms.     . Fluticasone-Salmeterol (ADVAIR) 250-50 MCG/DOSE AEPB Inhale 1 puff into the lungs 2 (two) times daily as needed (for shortness of breath).     . furosemide (LASIX) 80 MG tablet Take 1 tablet (80 mg total) by mouth daily. 30 tablet 0  . hydrALAZINE (APRESOLINE) 25 MG tablet Take 25 mg by mouth 3 (three) times daily.    Marland Kitchen HYDROcodone-acetaminophen (NORCO) 7.5-325 MG tablet Take 1-2 tablets by mouth daily as needed (pain).     Marland Kitchen lidocaine-prilocaine (EMLA) cream Apply 1 application topically as needed (prior to treatment).     . losartan (COZAAR) 50 MG tablet Take 50 mg by mouth daily.    Marland Kitchen omeprazole (PRILOSEC) 20 MG capsule Take 20 mg by mouth 2 (two) times daily before a meal.    . ondansetron (ZOFRAN) 4 MG tablet Take 4 mg by mouth every 8 (eight) hours as needed.    Marland Kitchen oxybutynin (DITROPAN XL) 15 MG 24 hr tablet Take 15 mg by mouth daily.    . pravastatin (PRAVACHOL) 40 MG tablet Take 40 mg by mouth at bedtime.     . predniSONE (DELTASONE) 20 MG tablet Take 40 mg by mouth daily with breakfast.    . topiramate (TOPAMAX) 25 MG tablet Take 25 mg by mouth as needed (for headaches).      No current facility-administered medications for this visit.      Allergies:   Enalapril maleate; Nitrofurantoin;  Sulfamethoxazole-trimethoprim; 2,4-d dimethylamine (amisol); Baclofen; Neosporin [neomycin-bacitracin zn-polymyx]; Quinine; Ultram [tramadol]; Zocor [simvastatin]; Bactrim [sulfamethoxazole-trimethoprim]; Levodopa; Macrodantin [nitrofurantoin macrocrystal]; and Quinine derivatives   Social History:  The patient  reports that she has never smoked. She has never used smokeless tobacco. She reports that she does not drink alcohol or use drugs.   Family History:   family history includes Heart attack in her brother; Heart attack (age of onset: 61) in her father; Heart disease in her brother and father; Hyperlipidemia in her father and mother; Hypertension in her father and mother.    Review of Systems: Review of Systems  Constitutional: Negative.   Respiratory: Negative.   Cardiovascular: Negative.   Gastrointestinal: Negative.   Musculoskeletal: Positive for falls.       Leg weakness, unsteady gait  Neurological: Negative.   Psychiatric/Behavioral: Negative.   All other systems reviewed and are negative.    PHYSICAL EXAM: VS:  BP Marland Kitchen)  160/60 (BP Location: Left Arm, Patient Position: Sitting, Cuff Size: Normal)   Pulse 66   Ht 4\' 10"  (1.473 m)   Wt 128 lb (58.1 kg)   BMI 26.75 kg/m  , BMI Body mass index is 26.75 kg/m. GEN: Well nourished, well developed, in no acute distress, obese  Presents with a walker and wheelchair HEENT: normal  Neck: no JVD, carotid bruits, or masses Cardiac: RRR; no murmurs, rubs, or gallops,no edema  Respiratory:  clear to auscultation bilaterally, normal work of breathing GI: soft, nontender, nondistended, + BS MS: no deformity or atrophy  Skin: warm and dry, no rash Neuro:  Strength and sensation are intact Psych: euthymic mood, full affect   Recent Labs: 11/28/2018: TSH 1.789 05/07/2019: Magnesium 2.2 05/23/2019: ALT 12; B Natriuretic Peptide 895.0 05/24/2019: Hemoglobin 9.6; Platelets 125 05/26/2019: BUN 61; Creatinine, Ser 6.48; Potassium 4.1; Sodium  140    Lipid Panel Lab Results  Component Value Date   CHOL 180 01/17/2018   HDL 48 01/17/2018   LDLCALC 101 (H) 01/17/2018   TRIG 154 (H) 01/17/2018      Wt Readings from Last 3 Encounters:  06/02/19 128 lb (58.1 kg)  05/26/19 130 lb 1.1 oz (59 kg)  05/08/19 126 lb 8.7 oz (57.4 kg)       ASSESSMENT AND PLAN:  Chronic diastolic CHF (congestive heart failure) (HCC) - Lasix daily, lower dry weight  Will work on BP Hydralazine up to 100 3 times daily  Hyperlipidemia, unspecified hyperlipidemia type - Plan: EKG 12-Lead Will check with Dr. Holley Raring for new numbers, no recent labs in the computer  SOB (shortness of breath) - Plan: EKG 12-Lead stress test with no ischemia Walks with a cane, leg deconditioning  End stage renal disease ([place]) - Plan: EKG 12-Lead Has hemodialysis 3 days per week  Managed by Dr. Holley Raring  Dependence on hemodialysis [facility]) - Plan: EKG 12-Lead HD 3x a week, Needs low dry weight  Type 2 diabetes mellitus with other specified complication, unspecified long term insulin use status ([place]) - Plan: EKG 12-Lead Hemoglobin A1c well controlled, 4.5 in 04/2019 Stable  Mitral valve stenosis Monitor, not severe  Aortic valve stenosis Moderate repeat echo one year  HTN Increase hydrlazine 100 TID   Total encounter time more than 25 minutes  Greater than 50% was spent in counseling and coordination of care with the patient   Disposition:   F/U  6 months   No orders of the defined types were placed in this encounter.    Signed, Esmond Plants, M.D., Ph.D. 06/02/2019  [place], [place]

## 2019-06-02 ENCOUNTER — Ambulatory Visit (INDEPENDENT_AMBULATORY_CARE_PROVIDER_SITE_OTHER): Payer: Medicare Other | Admitting: Cardiovascular Disease

## 2019-06-02 ENCOUNTER — Other Ambulatory Visit: Payer: Self-pay

## 2019-06-02 VITALS — BP 160/60 | HR 66 | Ht <= 58 in | Wt 128.0 lb

## 2019-06-02 DIAGNOSIS — I700 Atherosclerosis of aorta: Secondary | ICD-10-CM

## 2019-06-02 DIAGNOSIS — I5032 Chronic diastolic (congestive) heart failure: Secondary | ICD-10-CM

## 2019-06-02 DIAGNOSIS — N186 End stage renal disease: Secondary | ICD-10-CM | POA: Diagnosis not present

## 2019-06-02 DIAGNOSIS — I10 Essential (primary) hypertension: Secondary | ICD-10-CM

## 2019-06-02 DIAGNOSIS — I350 Nonrheumatic aortic (valve) stenosis: Secondary | ICD-10-CM

## 2019-06-02 DIAGNOSIS — Z992 Dependence on renal dialysis: Secondary | ICD-10-CM

## 2019-06-02 DIAGNOSIS — E782 Mixed hyperlipidemia: Secondary | ICD-10-CM

## 2019-06-02 MED ORDER — HYDRALAZINE HCL 100 MG PO TABS: 100.0000 mg | ORAL_TABLET | Freq: Three times a day (TID) | ORAL | 3 refills | Status: DC

## 2019-06-02 NOTE — Patient Instructions (Addendum)
Medication Instructions:  Your physician has recommended you make the following change in your medication:  1. INCREASE Hydralazine 100 mg three times a day  If you need a refill on your cardiac medications before your next appointment, please call your pharmacy.    Lab work: No new labs needed   If you have labs (blood work) drawn today and your tests are completely normal, you will receive your results only by: Marland Kitchen MyChart Message (if you have MyChart) OR . A paper copy in the mail If you have any lab test that is abnormal or we need to change your treatment, we will call you to review the results.   Testing/Procedures: No new testing needed   Follow-Up: At [facility], you and your health needs are our priority.  As part of our continuing mission to provide you with exceptional heart care, we have created designated Provider Care Teams.  These Care Teams include your primary Cardiologist (physician) and Advanced Practice Providers (APPs -  Physician Assistants and Nurse Practitioners) who all work together to provide you with the care you need, when you need it.  . You will need a follow up appointment in 6 months .   Please call our office 2 months in advance to schedule this appointment.    . Providers on your designated Care Team:   . Murray Hodgkins, NP . Christell Faith, PA-C . Marrianne Mood, PA-C  Any Other Special Instructions Will Be Listed Below (If Applicable).  For educational health videos Log in to : [email or web address] Or : [email or web address], password : [email or web address]

## 2019-07-12 NOTE — Progress Notes (Deleted)
Patient ID: [name], female    DOB: 1946-01-24, 73 y.o.   MRN: [id number]  HPI  Ms Chapmon is a 73 y/o female with a history of asthma, DM, HTN, CKD, hyperlipidemia, COPD, obstructive sleep apnea, GERD, gout, anemia, anxiety and ESRD on dialysis.   Echo report from 05/06/2019 reviewed and showed an EF of 60-65% along with moderate AS. Echo report from 12/27/17 reviewed and showed an EF of 55-60% along with mild MS, mild/moderate AS and a normal PA pressure.   Admitted 05/23/2019 due to acute on chronic HF. Cardiology consult obtained. Given dialysis. Elevated troponin thought to be due to demand ischemia. Discharged in 3 days. Admitted 05/04/2019 due to atypical chest pain and shortness of breath. Cardiology consult obtained. Started on prednisone due to costochondritis. Discharged after 4 days. Was in the ED 05/04/2019 due to shortness of breath where she was treated and released.   She presents today for a follow-up visit with a chief complaint of   Past Medical History:  Diagnosis Date  . (HFpEF) heart failure with preserved ejection fraction ([place])    a. TTE 01/2014: nl LV sys fxn, no valvular abnormalities; b. TTE 11/16: nl EF, mild LVH;  c. 04/2019 Echo: EF 60-65%. DD. Nl RV fxn. Mod AS. 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  . Allergy   . Anemia of chronic disease   . Anxiety   . Aortic atherosclerosis ([place])   . Asthma   . Chronic back pain   . COPD (chronic obstructive pulmonary disease) ([place])   . Diabetes mellitus with complication ([place])   . ESRD on hemodialysis ([place])    a. Tues/Sat; b. 2/2 small kidneys  . Essential hypertension   . Fistula    lower left arm  . GERD (gastroesophageal reflux disease)   . Gout   . History of exercise stress test    a. 01/2014: no evidence of ischemia; b. Lexiscan 08/2015: no sig ischemia, severe GI uptake artifact, low risk; c. CPET @ [facility] 09/2016: exercised 3 min 12 sec on bike without incline, 2.28 METs, VO2 of 8.1, 48% of  predicted, indicating mod to sev functional impairment, evidence of blunted HR, stroke volume, and BP augmentation as well as ventilation-perfusion mismatch with exercise  . HLD (hyperlipidemia)   . Moderate aortic stenosis    a. 04/2019 Echo: Mod AS.  . Non-obstructive Carotid arterial disease ([place])    a. 12/2017: <50% bilat ICA dzs.  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 Sleep apnea    Past Surgical History:  Procedure Laterality Date  . carpel tunnel    . GALLBLADDER SURGERY    . PERIPHERAL VASCULAR CATHETERIZATION N/A 04/12/2015   Procedure: A/V Shuntogram/Fistulagram;  Surgeon: Algernon Huxley, MD;  Location: [facility];  Service: Cardiovascular;  Laterality: N/A;  . PERIPHERAL VASCULAR CATHETERIZATION N/A 04/12/2015   Procedure: A/V Shunt Intervention;  Surgeon: Algernon Huxley, MD;  Location: [facility];  Service: Cardiovascular;  Laterality: N/A;  . PERIPHERAL VASCULAR CATHETERIZATION N/A 06/09/2015   Procedure: Dialysis/Perma Catheter Removal;  Surgeon: Katha Cabal, MD;  Location: [facility];  Service: Cardiovascular;  Laterality: N/A;   Family History  Problem Relation Age of Onset  . Hypertension Mother   . Hyperlipidemia Mother   . Heart disease Father   . Heart attack Father 38  . Hypertension Father   . Hyperlipidemia Father   . Heart disease  Brother        CABG   . Heart attack Brother   . Breast cancer Neg Hx    Social History   Tobacco Use  . Smoking status: Never Smoker  . Smokeless tobacco: Never Used  Substance Use Topics  . Alcohol use: No   Allergies  Allergen Reactions  . Enalapril Maleate Other (See Comments)    Other reaction(s): Headache  . Nitrofurantoin Swelling and Rash    Other Reaction: swelling of body  . Sulfamethoxazole-Trimethoprim Swelling  . 2,4-D Dimethylamine (Amisol) Rash and Other (See Comments)    Other Reaction: h/a  . Baclofen Other (See Comments) and Nausea Only     lightheadness ,drowsiness , muscle weakness , twitching in hands   . Neosporin [Neomycin-Bacitracin Zn-Polymyx] Other (See Comments) and Rash    Other Reaction: irritation Skin irritation   . Quinine Nausea And Vomiting, Rash and Other (See Comments)    Other Reaction: Vomiting, rash, h/a, vision  . Ultram [Tramadol] Palpitations  . Zocor [Simvastatin] Other (See Comments) and Rash    Other Reaction: muscle spasms Muscle pain and spasms  . Bactrim [Sulfamethoxazole-Trimethoprim] Swelling  . Levodopa Other (See Comments)    Reaction: unknown  . Macrodantin [Nitrofurantoin Macrocrystal] Swelling  . Quinine Derivatives Other (See Comments)    Vertigo,nausea vomiting blurred vision headache ears sensitivity      Review of Systems  Constitutional: Positive for fatigue. Negative for appetite change.  HENT: Positive for congestion. Negative for postnasal drip and sore throat.   Eyes: Negative.   Respiratory: Positive for chest tightness. Negative for shortness of breath.   Cardiovascular: Negative for chest pain, palpitations and leg swelling.  Gastrointestinal: Negative for abdominal distention and abdominal pain.  Endocrine: Negative.   Genitourinary: Negative.   Musculoskeletal: Positive for neck pain. Negative for back pain.  Skin: Negative.   Allergic/Immunologic: Negative.   Neurological: Negative for dizziness and light-headedness.  Hematological: Negative for adenopathy. Does not bruise/bleed easily.  Psychiatric/Behavioral: Negative for dysphoric mood and sleep disturbance (sleeping in recliner due to difficulty getting in her tall bed). The patient is not nervous/anxious.      Physical Exam Vitals signs and nursing note reviewed.  Constitutional:      Appearance: Normal appearance.  HENT:     Head: Normocephalic and atraumatic.  Neck:     Musculoskeletal: Normal range of motion and neck supple.  Cardiovascular:     Rate and Rhythm: Normal rate and regular rhythm.   Pulmonary:     Effort: Pulmonary effort is normal.     Breath sounds: Normal breath sounds. No rales.  Abdominal:     General: There is no distension.     Palpations: Abdomen is soft.  Musculoskeletal:        General: No swelling or tenderness.  Skin:    General: Skin is warm and dry.  Neurological:     General: No focal deficit present.     Mental Status: She is alert and oriented to person, place, and time.  Psychiatric:        Mood and Affect: Mood normal.        Behavior: Behavior normal.    Assessment & Plan:  1: Chronic heart failure with preserved ejection fraction- - NYHA class III - euvolemic today - weighing daily and she was reminded to call for an overnight weight gain of >2 pounds or a weekly weight gain of >5 pounds - wight 133.2 from last visit here 6 months ago -  not adding salt but hasn't been reading food labels much. Reviewed the importance of closely following a 2000mg  sodium diet   - saw cardiology Rockey Situ) 12/12/17 - BNP 11/28/2018 was 1283.0  2: HTN- - BP  - saw PCP ([facility]) 10/30/18 - BMP from 05/26/2019 reviewed and showed sodium 140, potassium 4.1, creatinine 6.48 and GFR 6  3: ESRD- - dialysis on Tuesday, Thursday and Saturday - saw vascular 10/16/18  Patient did not bring her medications nor a list. Each medication was verbally reviewed with the patient and she was encouraged to bring the bottles to every visit to confirm accuracy of list.

## 2019-07-14 ENCOUNTER — Telehealth: Payer: Self-pay | Admitting: Family

## 2019-07-14 ENCOUNTER — Ambulatory Visit: Payer: Medicare Other | Admitting: Family

## 2019-07-14 NOTE — Telephone Encounter (Signed)
Patient did not show for her [facility] Failure [facility] appointment on 07/14/2019. Will attempt to reschedule.

## 2019-09-05 ENCOUNTER — Inpatient Hospital Stay
Admission: EM | Admit: 2019-09-05 | Discharge: 2019-09-07 | DRG: 190 | Disposition: A | Discharge status: Home Health | Payer: Medicare Other | Attending: Specialist | Admitting: Specialist

## 2019-09-05 ENCOUNTER — Emergency Department: Payer: Medicare Other

## 2019-09-05 ENCOUNTER — Encounter: Payer: Self-pay | Admitting: Emergency Medicine

## 2019-09-05 ENCOUNTER — Other Ambulatory Visit: Payer: Self-pay

## 2019-09-05 DIAGNOSIS — N2581 Secondary hyperparathyroidism of renal origin: Secondary | ICD-10-CM | POA: Diagnosis present

## 2019-09-05 DIAGNOSIS — I2510 Atherosclerotic heart disease of native coronary artery without angina pectoris: Secondary | ICD-10-CM | POA: Diagnosis present

## 2019-09-05 DIAGNOSIS — Z8249 Family history of ischemic heart disease and other diseases of the circulatory system: Secondary | ICD-10-CM

## 2019-09-05 DIAGNOSIS — K219 Gastro-esophageal reflux disease without esophagitis: Secondary | ICD-10-CM | POA: Diagnosis present

## 2019-09-05 DIAGNOSIS — G4733 Obstructive sleep apnea (adult) (pediatric): Secondary | ICD-10-CM | POA: Diagnosis present

## 2019-09-05 DIAGNOSIS — I132 Hypertensive heart and chronic kidney disease with heart failure and with stage 5 chronic kidney disease, or end stage renal disease: Secondary | ICD-10-CM | POA: Diagnosis present

## 2019-09-05 DIAGNOSIS — E1122 Type 2 diabetes mellitus with diabetic chronic kidney disease: Secondary | ICD-10-CM | POA: Diagnosis present

## 2019-09-05 DIAGNOSIS — N186 End stage renal disease: Secondary | ICD-10-CM | POA: Diagnosis present

## 2019-09-05 DIAGNOSIS — J209 Acute bronchitis, unspecified: Secondary | ICD-10-CM | POA: Diagnosis present

## 2019-09-05 DIAGNOSIS — M109 Gout, unspecified: Secondary | ICD-10-CM | POA: Diagnosis present

## 2019-09-05 DIAGNOSIS — E785 Hyperlipidemia, unspecified: Secondary | ICD-10-CM | POA: Diagnosis present

## 2019-09-05 DIAGNOSIS — I10 Essential (primary) hypertension: Secondary | ICD-10-CM | POA: Diagnosis present

## 2019-09-05 DIAGNOSIS — J441 Chronic obstructive pulmonary disease with (acute) exacerbation: Secondary | ICD-10-CM | POA: Diagnosis present

## 2019-09-05 DIAGNOSIS — D631 Anemia in chronic kidney disease: Secondary | ICD-10-CM | POA: Diagnosis present

## 2019-09-05 DIAGNOSIS — I350 Nonrheumatic aortic (valve) stenosis: Secondary | ICD-10-CM | POA: Diagnosis present

## 2019-09-05 DIAGNOSIS — Z8349 Family history of other endocrine, nutritional and metabolic diseases: Secondary | ICD-10-CM

## 2019-09-05 DIAGNOSIS — R0602 Shortness of breath: Secondary | ICD-10-CM | POA: Diagnosis present

## 2019-09-05 DIAGNOSIS — Z992 Dependence on renal dialysis: Secondary | ICD-10-CM | POA: Diagnosis not present

## 2019-09-05 DIAGNOSIS — Z882 Allergy status to sulfonamides status: Secondary | ICD-10-CM

## 2019-09-05 DIAGNOSIS — Z881 Allergy status to other antibiotic agents status: Secondary | ICD-10-CM | POA: Diagnosis not present

## 2019-09-05 DIAGNOSIS — N3281 Overactive bladder: Secondary | ICD-10-CM | POA: Diagnosis present

## 2019-09-05 DIAGNOSIS — R32 Unspecified urinary incontinence: Secondary | ICD-10-CM | POA: Diagnosis present

## 2019-09-05 DIAGNOSIS — I5032 Chronic diastolic (congestive) heart failure: Secondary | ICD-10-CM | POA: Diagnosis present

## 2019-09-05 DIAGNOSIS — R0902 Hypoxemia: Secondary | ICD-10-CM | POA: Diagnosis present

## 2019-09-05 DIAGNOSIS — E1169 Type 2 diabetes mellitus with other specified complication: Secondary | ICD-10-CM | POA: Diagnosis present

## 2019-09-05 DIAGNOSIS — Z7952 Long term (current) use of systemic steroids: Secondary | ICD-10-CM

## 2019-09-05 DIAGNOSIS — Z20828 Contact with and (suspected) exposure to other viral communicable diseases: Secondary | ICD-10-CM | POA: Diagnosis present

## 2019-09-05 DIAGNOSIS — R079 Chest pain, unspecified: Secondary | ICD-10-CM

## 2019-09-05 DIAGNOSIS — Z888 Allergy status to other drugs, medicaments and biological substances status: Secondary | ICD-10-CM

## 2019-09-05 DIAGNOSIS — Z7902 Long term (current) use of antithrombotics/antiplatelets: Secondary | ICD-10-CM

## 2019-09-05 DIAGNOSIS — J440 Chronic obstructive pulmonary disease with acute lower respiratory infection: Secondary | ICD-10-CM | POA: Diagnosis present

## 2019-09-05 LAB — CBC WITH DIFFERENTIAL/PLATELET
Abs Immature Granulocytes: 0.05 10*3/uL (ref 0.00–0.07)
Basophils Absolute: 0 10*3/uL (ref 0.0–0.1)
Basophils Relative: 0 %
Eosinophils Absolute: 0.2 10*3/uL (ref 0.0–0.5)
Eosinophils Relative: 3 %
HCT: 26.3 % — ABNORMAL LOW (ref 36.0–46.0)
Hemoglobin: 8.5 g/dL — ABNORMAL LOW (ref 12.0–15.0)
Immature Granulocytes: 1 %
Lymphocytes Relative: 6 %
Lymphs Abs: 0.6 10*3/uL — ABNORMAL LOW (ref 0.7–4.0)
MCH: 29.7 pg (ref 26.0–34.0)
MCHC: 32.3 g/dL (ref 30.0–36.0)
MCV: 92 fL (ref 80.0–100.0)
Monocytes Absolute: 0.6 10*3/uL (ref 0.1–1.0)
Monocytes Relative: 7 %
Neutro Abs: 8 10*3/uL — ABNORMAL HIGH (ref 1.7–7.7)
Neutrophils Relative %: 83 %
Platelets: 137 10*3/uL — ABNORMAL LOW (ref 150–400)
RBC: 2.86 MIL/uL — ABNORMAL LOW (ref 3.87–5.11)
RDW: 13.3 % (ref 11.5–15.5)
WBC: 9.5 10*3/uL (ref 4.0–10.5)
nRBC: 0 % (ref 0.0–0.2)

## 2019-09-05 LAB — BASIC METABOLIC PANEL
Anion gap: 11 (ref 5–15)
BUN: 30 mg/dL — ABNORMAL HIGH (ref 8–23)
CO2: 30 mmol/L (ref 22–32)
Calcium: 8.2 mg/dL — ABNORMAL LOW (ref 8.9–10.3)
Chloride: 100 mmol/L (ref 98–111)
Creatinine, Ser: 5.5 mg/dL — ABNORMAL HIGH (ref 0.44–1.00)
GFR calc Af Amer: 8 mL/min — ABNORMAL LOW (ref >60)
GFR calc non Af Amer: 7 mL/min — ABNORMAL LOW (ref >60)
Glucose, Bld: 226 mg/dL — ABNORMAL HIGH (ref 70–99)
Potassium: 3.9 mmol/L (ref 3.5–5.1)
Sodium: 141 mmol/L (ref 135–145)

## 2019-09-05 LAB — TROPONIN I (HIGH SENSITIVITY)
Troponin I (High Sensitivity): 34 ng/L — ABNORMAL HIGH (ref <18)
Troponin I (High Sensitivity): 61 ng/L — ABNORMAL HIGH (ref <18)

## 2019-09-05 LAB — BRAIN NATRIURETIC PEPTIDE: B Natriuretic Peptide: 979 pg/mL — ABNORMAL HIGH (ref 0.0–100.0)

## 2019-09-05 MED ORDER — HYDROCODONE-ACETAMINOPHEN 5-325 MG PO TABS
1.0000 | ORAL_TABLET | Freq: Once | ORAL | Status: AC
  Administered 2019-09-05: 1 via ORAL
  Filled 2019-09-05: qty 1

## 2019-09-05 MED ORDER — NITROGLYCERIN 0.4 MG SL SUBL
0.4000 mg | SUBLINGUAL_TABLET | Freq: Once | SUBLINGUAL | Status: AC
  Administered 2019-09-05: 0.4 mg via SUBLINGUAL
  Filled 2019-09-05: qty 1

## 2019-09-05 MED ORDER — ACETAMINOPHEN 325 MG PO TABS
650.0000 mg | ORAL_TABLET | Freq: Four times a day (QID) | ORAL | Status: DC | PRN
  Administered 2019-09-06: 650 mg via ORAL
  Filled 2019-09-05: qty 2

## 2019-09-05 MED ORDER — IPRATROPIUM-ALBUTEROL 0.5-2.5 (3) MG/3ML IN SOLN
3.0000 mL | Freq: Once | RESPIRATORY_TRACT | Status: AC
  Administered 2019-09-05: 3 mL via RESPIRATORY_TRACT
  Filled 2019-09-05: qty 3

## 2019-09-05 MED ORDER — CALCIUM ACETATE (PHOS BINDER) 667 MG PO CAPS
667.0000 mg | ORAL_CAPSULE | Freq: Three times a day (TID) | ORAL | Status: DC
  Administered 2019-09-06 – 2019-09-07 (×5): 667 mg via ORAL
  Filled 2019-09-05 (×5): qty 1

## 2019-09-05 MED ORDER — CLOPIDOGREL BISULFATE 75 MG PO TABS
75.0000 mg | ORAL_TABLET | Freq: Every day | ORAL | Status: DC
  Administered 2019-09-06 – 2019-09-07 (×2): 75 mg via ORAL
  Filled 2019-09-05 (×2): qty 1

## 2019-09-05 MED ORDER — PANTOPRAZOLE SODIUM 40 MG PO TBEC
40.0000 mg | DELAYED_RELEASE_TABLET | Freq: Two times a day (BID) | ORAL | Status: DC
  Administered 2019-09-06 – 2019-09-07 (×3): 40 mg via ORAL
  Filled 2019-09-05 (×4): qty 1

## 2019-09-05 MED ORDER — GUAIFENESIN-DM 100-10 MG/5ML PO SYRP
5.0000 mL | ORAL_SOLUTION | ORAL | Status: DC | PRN
  Filled 2019-09-05: qty 5

## 2019-09-05 MED ORDER — INSULIN ASPART 100 UNIT/ML ~~LOC~~ SOLN
0.0000 [IU] | Freq: Three times a day (TID) | SUBCUTANEOUS | Status: DC
  Administered 2019-09-06: 1 [IU] via SUBCUTANEOUS
  Administered 2019-09-06: 3 [IU] via SUBCUTANEOUS
  Administered 2019-09-06 – 2019-09-07 (×2): 2 [IU] via SUBCUTANEOUS
  Filled 2019-09-05 (×4): qty 1

## 2019-09-05 MED ORDER — HEPARIN SODIUM (PORCINE) 5000 UNIT/ML IJ SOLN
5000.0000 [IU] | Freq: Three times a day (TID) | INTRAMUSCULAR | Status: DC
  Administered 2019-09-06 – 2019-09-07 (×4): 5000 [IU] via SUBCUTANEOUS
  Filled 2019-09-05 (×4): qty 1

## 2019-09-05 MED ORDER — IPRATROPIUM-ALBUTEROL 20-100 MCG/ACT IN AERS
1.0000 | INHALATION_SPRAY | Freq: Four times a day (QID) | RESPIRATORY_TRACT | Status: DC | PRN
  Filled 2019-09-05: qty 4

## 2019-09-05 MED ORDER — MOMETASONE FURO-FORMOTEROL FUM 200-5 MCG/ACT IN AERO
2.0000 | INHALATION_SPRAY | Freq: Two times a day (BID) | RESPIRATORY_TRACT | Status: DC
  Administered 2019-09-06 – 2019-09-07 (×2): 2 via RESPIRATORY_TRACT
  Filled 2019-09-05: qty 8.8

## 2019-09-05 MED ORDER — AZITHROMYCIN 250 MG PO TABS
500.0000 mg | ORAL_TABLET | Freq: Every day | ORAL | Status: DC
  Administered 2019-09-06 – 2019-09-07 (×2): 500 mg via ORAL
  Filled 2019-09-05 (×2): qty 2

## 2019-09-05 MED ORDER — PRAVASTATIN SODIUM 20 MG PO TABS
40.0000 mg | ORAL_TABLET | Freq: Every day | ORAL | Status: DC
  Administered 2019-09-06: 40 mg via ORAL
  Filled 2019-09-05: qty 2

## 2019-09-05 MED ORDER — FUROSEMIDE 40 MG PO TABS
80.0000 mg | ORAL_TABLET | Freq: Every day | ORAL | Status: DC
  Administered 2019-09-07: 08:00:00 80 mg via ORAL
  Filled 2019-09-05 (×2): qty 2

## 2019-09-05 MED ORDER — HYDROCODONE-ACETAMINOPHEN 7.5-325 MG PO TABS
1.0000 | ORAL_TABLET | Freq: Two times a day (BID) | ORAL | Status: DC | PRN
  Administered 2019-09-06: 01:00:00 1 via ORAL
  Filled 2019-09-05 (×2): qty 1

## 2019-09-05 MED ORDER — METHYLPREDNISOLONE SODIUM SUCC 125 MG IJ SOLR
125.0000 mg | Freq: Once | INTRAMUSCULAR | Status: AC
  Administered 2019-09-05: 17:00:00 125 mg via INTRAVENOUS
  Filled 2019-09-05: qty 2

## 2019-09-05 MED ORDER — INSULIN ASPART 100 UNIT/ML ~~LOC~~ SOLN: 0.0000 [IU] | Freq: Every day | SUBCUTANEOUS | Status: DC

## 2019-09-05 MED ORDER — AMLODIPINE BESYLATE 10 MG PO TABS
10.0000 mg | ORAL_TABLET | Freq: Every day | ORAL | Status: DC
  Administered 2019-09-07: 10 mg via ORAL
  Filled 2019-09-05 (×2): qty 1

## 2019-09-05 MED ORDER — ONDANSETRON HCL 4 MG/2ML IJ SOLN: 4.0000 mg | Freq: Four times a day (QID) | INTRAMUSCULAR | Status: DC | PRN

## 2019-09-05 MED ORDER — METHYLPREDNISOLONE SODIUM SUCC 125 MG IJ SOLR
60.0000 mg | Freq: Four times a day (QID) | INTRAMUSCULAR | Status: DC
  Administered 2019-09-06 (×2): 60 mg via INTRAVENOUS
  Filled 2019-09-05 (×2): qty 2

## 2019-09-05 MED ORDER — ONDANSETRON HCL 4 MG PO TABS: 4.0000 mg | ORAL_TABLET | Freq: Four times a day (QID) | ORAL | Status: DC | PRN

## 2019-09-05 MED ORDER — CARVEDILOL 12.5 MG PO TABS
12.5000 mg | ORAL_TABLET | Freq: Two times a day (BID) | ORAL | Status: DC
  Administered 2019-09-07: 08:00:00 12.5 mg via ORAL
  Filled 2019-09-05 (×2): qty 1

## 2019-09-05 MED ORDER — ACETAMINOPHEN 650 MG RE SUPP: 650.0000 mg | Freq: Four times a day (QID) | RECTAL | Status: DC | PRN

## 2019-09-05 NOTE — ED Provider Notes (Signed)
[facility] Emergency Department Provider Note       Time seen: ----------------------------------------- 4:53 PM on 09/05/2019 -----------------------------------------   I have reviewed the triage vital signs and the nursing notes.  HISTORY   Chief Complaint Shortness of Breath    HPI Phyllis [name] is a 73 y.o. female with a history of congestive heart failure, anemia, anxiety, COPD, end-stage renal disease on dialysis, hypertension, GERD, gout, hyperlipidemia who presents to the ED for sudden onset of shortness of breath.  EMS reports sudden onset of shortness of breath with sats around 74% on room air, 95% on a nonrebreather.  She was given a breathing treatment in route, last dialysis treatment was yesterday.  Past Medical History:  Diagnosis Date  . (HFpEF) heart failure with preserved ejection fraction ([place])    a. TTE 01/2014: nl LV sys fxn, no valvular abnormalities; b. TTE 11/16: nl EF, mild LVH;  c. 04/2019 Echo: EF 60-65%. DD. Nl RV fxn. Mod AS. 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  . Allergy   . Anemia of chronic disease   . Anxiety   . Aortic atherosclerosis ([place])   . Asthma   . Chronic back pain   . COPD (chronic obstructive pulmonary disease) ([place])   . Diabetes mellitus with complication ([place])   . ESRD on hemodialysis ([place])    a. Tues/Sat; b. 2/2 small kidneys  . Essential hypertension   . Fistula    lower left arm  . GERD (gastroesophageal reflux disease)   . Gout   . History of exercise stress test    a. 01/2014: no evidence of ischemia; b. Lexiscan 08/2015: no sig ischemia, severe GI uptake artifact, low risk; c. CPET @ [facility] 09/2016: exercised 3 min 12 sec on bike without incline, 2.28 METs, VO2 of 8.1, 48% of predicted, indicating mod to sev functional impairment, evidence of blunted HR, stroke volume, and BP augmentation as well as ventilation-perfusion mismatch with exercise  . HLD (hyperlipidemia)   . Moderate  aortic stenosis    a. 04/2019 Echo: Mod AS.  . Non-obstructive Carotid arterial disease ([place])    a. 12/2017: <50% bilat ICA dzs.  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 Sleep apnea     Patient Active Problem List   Diagnosis Date Noted  . Acute CHF (congestive heart failure) ([place]) 05/24/2019  . Hyperkalemia 11/29/2018  . Chronic diastolic heart failure ([place]) 11/02/2018  . HTN (hypertension) 11/02/2018  . Uncontrolled hypertension 10/21/2018  . Pressure injury of skin 03/16/2018  . CVA (cerebral vascular accident) ([place]) 01/16/2018  . Aortic atherosclerosis ([place]) 12/11/2017  . Chest pain 09/18/2017  . Hyperlipidemia 08/06/2015  . Complication from renal dialysis device 04/12/2015  . SOB (shortness of breath) 02/01/2015  . End stage renal disease on dialysis ([place]) 02/01/2015  . Type 2 diabetes mellitus with other specified complication ([place]) [id number]  . Asthma 02/01/2015  . Acute on chronic diastolic CHF (congestive heart failure) ([place]) 02/01/2015    Past Surgical History:  Procedure Laterality Date  . carpel tunnel    . GALLBLADDER SURGERY    . PERIPHERAL VASCULAR CATHETERIZATION N/A 04/12/2015   Procedure: A/V Shuntogram/Fistulagram;  Surgeon: Algernon Huxley, MD;  Location: [facility];  Service: Cardiovascular;  Laterality: N/A;  . PERIPHERAL VASCULAR CATHETERIZATION N/A 04/12/2015   Procedure: A/V Shunt Intervention;  Surgeon: Algernon Huxley, MD;  Location: [facility];  Service: Cardiovascular;  Laterality: N/A;  .  PERIPHERAL VASCULAR CATHETERIZATION N/A 06/09/2015   Procedure: Dialysis/Perma Catheter Removal;  Surgeon: Katha Cabal, MD;  Location: [facility];  Service: Cardiovascular;  Laterality: N/A;    Allergies Enalapril maleate; Nitrofurantoin; Sulfamethoxazole-trimethoprim; 2,4-d dimethylamine (amisol); Baclofen; Neosporin [neomycin-bacitracin zn-polymyx]; Quinine; Ultram [tramadol]; Zocor [simvastatin]; Bactrim  [sulfamethoxazole-trimethoprim]; Levodopa; Macrodantin [nitrofurantoin macrocrystal]; and Quinine derivatives  Social History Social History   Tobacco Use  . Smoking status: Never Smoker  . Smokeless tobacco: Never Used  Substance Use Topics  . Alcohol use: No  . Drug use: No   Review of Systems Constitutional: Negative for fever. Cardiovascular: Negative for chest pain. Respiratory: Positive for shortness of breath Gastrointestinal: Negative for abdominal pain, vomiting and diarrhea. Musculoskeletal: Negative for back pain. Skin: Negative for rash. Neurological: Negative for headaches, focal weakness or numbness.  All systems negative/normal/unremarkable except as stated in the HPI  ____________________________________________   PHYSICAL EXAM:  VITAL SIGNS: ED Triage Vitals  Enc Vitals Group     BP 09/05/19 1647 (!) 144/64     Pulse Rate 09/05/19 1647 91     Resp 09/05/19 1647 12     Temp 09/05/19 1647 98.4 F (36.9 C)     Temp Source 09/05/19 1647 Oral     SpO2 09/05/19 1647 100 %     Weight 09/05/19 1648 131 lb (59.4 kg)     Height 09/05/19 1648 4\' 10"  (1.473 m)     Head Circumference --      Peak Flow --      Pain Score 09/05/19 1648 0     Pain Loc --      Pain Edu? --      Excl. in [place]? --     Constitutional: Alert and oriented.  Mild distress Eyes: Conjunctivae are normal. Normal extraocular movements. ENT      Head: Normocephalic and atraumatic.      Nose: No congestion/rhinnorhea.      Mouth/Throat: Mucous membranes are moist.      Neck: No stridor. Cardiovascular: Normal rate, regular rhythm. No murmurs, rubs, or gallops. Respiratory: Normal respiratory effort without tachypnea, bilateral rales and rhonchi Gastrointestinal: Soft and nontender. Normal bowel sounds Musculoskeletal: Nontender with normal range of motion in extremities. No lower extremity tenderness nor edema. Neurologic:  Normal speech and language. No gross focal neurologic deficits  are appreciated.  Skin:  Skin is warm, dry and intact. No rash noted. Psychiatric: Mood and affect are normal. Speech and behavior are normal.  ____________________________________________  EKG: Interpreted by me.  Sinus rhythm with a rate of 92 bpm, borderline repolarization abnormality, normal axis  ____________________________________________  ED COURSE:  As part of my medical decision making, I reviewed the following data within the [facility] History obtained from family if available, nursing notes, old chart and ekg, as well as notes from prior ED visits. Patient presented for shortness of breath, we will assess with labs and imaging as indicated at this time.   Procedures  Anastashia Cumba Rockwell was evaluated in Emergency Department on 09/05/2019 for the symptoms described in the history of present illness. She was evaluated in the context of the global COVID-19 pandemic, which necessitated consideration that the patient might be at risk for infection with the SARS-CoV-2 virus that causes COVID-19. Institutional protocols and algorithms that pertain to the evaluation of patients at risk for COVID-19 are in a state of rapid change based on information released by regulatory bodies including the CDC and federal and state organizations. These policies  and algorithms were followed during the patient's care in the ED.  ____________________________________________   LABS (pertinent positives/negatives)  Labs Reviewed  CBC WITH DIFFERENTIAL/PLATELET - Abnormal; Notable for the following components:      Result Value   RBC 2.86 (*)    Hemoglobin 8.5 (*)    HCT 26.3 (*)    Platelets 137 (*)    Neutro Abs 8.0 (*)    Lymphs Abs 0.6 (*)    All other components within normal limits  BASIC METABOLIC PANEL - Abnormal; Notable for the following components:   Glucose, Bld 226 (*)    BUN 30 (*)    Creatinine, Ser 5.50 (*)    Calcium 8.2 (*)    GFR calc non Af Amer 7 (*)    GFR  calc Af Amer 8 (*)    All other components within normal limits  BRAIN NATRIURETIC PEPTIDE - Abnormal; Notable for the following components:   B Natriuretic Peptide 979.0 (*)    All other components within normal limits  TROPONIN I (HIGH SENSITIVITY) - Abnormal; Notable for the following components:   Troponin I (High Sensitivity) 34 (*)    All other components within normal limits  TROPONIN I (HIGH SENSITIVITY) - Abnormal; Notable for the following components:   Troponin I (High Sensitivity) 61 (*)    All other components within normal limits  SARS CORONAVIRUS 2 (TAT 6-24 HRS)    RADIOLOGY Images were viewed by me  Chest x-ray  IMPRESSION:  Mild peribronchial thickening and interstitial prominence may  reflect bronchitic changes.  ____________________________________________   DIFFERENTIAL DIAGNOSIS   CHF, COPD, pneumonia, MI, PE, end-stage renal disease, pulmonary edema  FINAL ASSESSMENT AND PLAN  Dyspnea, chest pain, end-stage renal disease   Plan: The patient had presented for sudden onset of shortness of breath with hypoxia. Patient's labs did not reveal any acute abnormality although her troponin doubled from 34-61. Patient's imaging revealed bronchitic changes without other acute process.  She is still complaining of significant chest pain of uncertain etiology.  I will discuss with the hospitalist for admission.   Laurence Aly, MD    Note: This note was generated in part or whole with voice recognition software. Voice recognition is usually quite accurate but there are transcription errors that can and very often do occur. I apologize for any typographical errors that were not detected and corrected.     Earleen Newport, MD 09/05/19 2113

## 2019-09-05 NOTE — H&P (Signed)
[facility] NAME: Phyllis [name]    MR#:  [id number]  DATE OF BIRTH:  11-23-1945  DATE OF ADMISSION:  09/05/2019  PRIMARY CARE PHYSICIAN: [name], MD   REQUESTING/REFERRING PHYSICIAN: Jimmye Norman, MD  CHIEF COMPLAINT:   Chief Complaint  Patient presents with  . Shortness of Breath    HISTORY OF PRESENT ILLNESS:  Phyllis [name]  is a 73 y.o. female who presents with chief complaint as above.  Patient presents to the ED with complaint of increasing shortness of breath and central chest discomfort.  Notes that she feels like this is costochondritis.  Work-up here in the ED is consistent with bronchitis on chest imaging.  However, she does also have a mildly elevated troponin.  She is a dialysis patient.  Hospitalist were called for admission and further evaluation and treatment  PAST MEDICAL HISTORY:   Past Medical History:  Diagnosis Date  . (HFpEF) heart failure with preserved ejection fraction ([place])    a. TTE 01/2014: nl LV sys fxn, no valvular abnormalities; b. TTE 11/16: nl EF, mild LVH;  c. 04/2019 Echo: EF 60-65%. DD. Nl RV fxn. Mod AS. 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  . Allergy   . Anemia of chronic disease   . Anxiety   . Aortic atherosclerosis ([place])   . Asthma   . Chronic back pain   . COPD (chronic obstructive pulmonary disease) ([place])   . Diabetes mellitus with complication ([place])   . ESRD on hemodialysis ([place])    a. Tues/Sat; b. 2/2 small kidneys  . Essential hypertension   . Fistula    lower left arm  . GERD (gastroesophageal reflux disease)   . Gout   . History of exercise stress test    a. 01/2014: no evidence of ischemia; b. Lexiscan 08/2015: no sig ischemia, severe GI uptake artifact, low risk; c. CPET @ [facility] 09/2016: exercised 3 min 12 sec on bike without incline, 2.28 METs, VO2 of 8.1, 48% of predicted, indicating mod to sev functional impairment, evidence of blunted HR,  stroke volume, and BP augmentation as well as ventilation-perfusion mismatch with exercise  . HLD (hyperlipidemia)   . Moderate aortic stenosis    a. 04/2019 Echo: Mod AS.  . Non-obstructive Carotid arterial disease ([place])    a. 12/2017: <50% bilat ICA dzs.  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 Sleep apnea      PAST SURGICAL HISTORY:   Past Surgical History:  Procedure Laterality Date  . carpel tunnel    . GALLBLADDER SURGERY    . PERIPHERAL VASCULAR CATHETERIZATION N/A 04/12/2015   Procedure: A/V Shuntogram/Fistulagram;  Surgeon: Algernon Huxley, MD;  Location: [facility];  Service: Cardiovascular;  Laterality: N/A;  . PERIPHERAL VASCULAR CATHETERIZATION N/A 04/12/2015   Procedure: A/V Shunt Intervention;  Surgeon: Algernon Huxley, MD;  Location: [facility];  Service: Cardiovascular;  Laterality: N/A;  . PERIPHERAL VASCULAR CATHETERIZATION N/A 06/09/2015   Procedure: Dialysis/Perma Catheter Removal;  Surgeon: Katha Cabal, MD;  Location: [facility];  Service: Cardiovascular;  Laterality: N/A;     SOCIAL HISTORY:   Social History   Tobacco Use  . Smoking status: Never Smoker  . Smokeless tobacco: Never Used  Substance Use Topics  . Alcohol use: No     FAMILY HISTORY:   Family History  Problem Relation Age of Onset  . Hypertension  Mother   . Hyperlipidemia Mother   . Heart disease Father   . Heart attack Father 28  . Hypertension Father   . Hyperlipidemia Father   . Heart disease Brother        CABG   . Heart attack Brother   . Breast cancer Neg Hx      DRUG ALLERGIES:   Allergies  Allergen Reactions  . Enalapril Maleate Other (See Comments)    Other reaction(s): Headache  . Nitrofurantoin Swelling and Rash    Other Reaction: swelling of body  . Sulfamethoxazole-Trimethoprim Swelling  . 2,4-D Dimethylamine (Amisol) Rash and Other (See Comments)    Other Reaction: h/a  . Baclofen Other (See Comments) and  Nausea Only    lightheadness ,drowsiness , muscle weakness , twitching in hands   . Neosporin [Neomycin-Bacitracin Zn-Polymyx] Other (See Comments) and Rash    Other Reaction: irritation Skin irritation   . Quinine Nausea And Vomiting, Rash and Other (See Comments)    Other Reaction: Vomiting, rash, h/a, vision  . Ultram [Tramadol] Palpitations  . Zocor [Simvastatin] Other (See Comments) and Rash    Other Reaction: muscle spasms Muscle pain and spasms  . Bactrim [Sulfamethoxazole-Trimethoprim] Swelling  . Levodopa Other (See Comments)    Reaction: unknown  . Macrodantin [Nitrofurantoin Macrocrystal] Swelling  . Quinine Derivatives Other (See Comments)    Vertigo,nausea vomiting blurred vision headache ears sensitivity     MEDICATIONS AT HOME:   Prior to Admission medications   Medication Sig Start Date End Date Taking? Authorizing Provider  albuterol (PROVENTIL HFA;VENTOLIN HFA) 108 (90 Base) MCG/ACT inhaler Inhale 2 puffs into the lungs every 6 (six) hours as needed for wheezing or shortness of breath.    [provider]  albuterol (PROVENTIL) (2.5 MG/3ML) 0.083% nebulizer solution Take 3 mLs (2.5 mg total) by nebulization every 6 (six) hours as needed for wheezing or shortness of breath. 05/04/19   Nance Pear, MD  amLODipine (NORVASC) 10 MG tablet Take 10 mg by mouth.  12/29/13   [provider]  ANECREAM 4 % cream APPLY TOPICALLY 2 (TWO) TIMES DAILY FOR 30 DAYS 05/12/19   [provider]  calcium acetate (PHOSLO) 667 MG capsule Take 667 mg by mouth 3 (three) times daily with meals.     [provider]  carvedilol (COREG) 12.5 MG tablet Take 12.5 mg by mouth 2 (two) times daily with a meal.  01/09/14   [provider]  cetirizine (ZYRTEC) 10 MG tablet Take 10 mg by mouth at bedtime.     [provider]  clopidogrel (PLAVIX) 75 MG tablet Take 1 tablet (75 mg total) by mouth daily. 01/18/18   Epifanio Lesches, MD   cyclobenzaprine (FLEXERIL) 10 MG tablet Take 10 mg by mouth daily as needed for muscle spasms.     [provider]  Fluticasone-Salmeterol (ADVAIR) 250-50 MCG/DOSE AEPB Inhale 1 puff into the lungs 2 (two) times daily as needed (for shortness of breath).     [provider]  furosemide (LASIX) 80 MG tablet Take 1 tablet (80 mg total) by mouth daily. 03/17/18   Loletha Grayer, MD  hydrALAZINE (APRESOLINE) 100 MG tablet Take 1 tablet (100 mg total) by mouth 3 (three) times daily. 06/02/19   Minna Merritts, MD  HYDROcodone-acetaminophen (NORCO) 7.5-325 MG tablet Take 1-2 tablets by mouth daily as needed (pain).  04/09/19   [provider]  lidocaine-prilocaine (EMLA) cream Apply 1 application topically as needed (prior to treatment).  [provider]  losartan (COZAAR) 50 MG tablet Take 50 mg by mouth daily. 03/18/19   [provider]  omeprazole (PRILOSEC) 20 MG capsule Take 20 mg by mouth 2 (two) times daily before a meal.    [provider]  ondansetron (ZOFRAN) 4 MG tablet Take 4 mg by mouth every 8 (eight) hours as needed. 05/20/19   [provider]  oxybutynin (DITROPAN XL) 15 MG 24 hr tablet Take 15 mg by mouth daily. 12/24/17   [provider]  pravastatin (PRAVACHOL) 40 MG tablet Take 40 mg by mouth at bedtime.     [provider]  predniSONE (DELTASONE) 20 MG tablet Take 40 mg by mouth daily with breakfast.    [provider]  topiramate (TOPAMAX) 25 MG tablet Take 25 mg by mouth as needed (for headaches).     [provider]    REVIEW OF SYSTEMS:  Review of Systems  Constitutional: Negative for chills, fever, malaise/fatigue and weight loss.  HENT: Negative for ear pain, hearing loss and tinnitus.   Eyes: Negative for blurred vision, double vision, pain and redness.  Respiratory: Positive for shortness of breath. Negative for cough and hemoptysis.   Cardiovascular: Positive for chest  pain. Negative for palpitations, orthopnea and leg swelling.  Gastrointestinal: Negative for abdominal pain, constipation, diarrhea, nausea and vomiting.  Genitourinary: Negative for dysuria, frequency and hematuria.  Musculoskeletal: Negative for back pain, joint pain and neck pain.  Skin:       No acne, rash, or lesions  Neurological: Negative for dizziness, tremors, focal weakness and weakness.  Endo/Heme/Allergies: Negative for polydipsia. Does not bruise/bleed easily.  Psychiatric/Behavioral: Negative for depression. The patient is not nervous/anxious and does not have insomnia.      VITAL SIGNS:   Vitals:   09/05/19 1930 09/05/19 2000 09/05/19 2030 09/05/19 2100  BP: (!) 157/58 (!) 149/58 (!) 151/63 (!) 140/55  Pulse: 86 85 86 83  Resp: 16 14 16 13   Temp:      TempSrc:      SpO2: 98% 99% 100% 99%  Weight:      Height:       Wt Readings from Last 3 Encounters:  09/05/19 59.4 kg  06/02/19 58.1 kg  05/26/19 59 kg    PHYSICAL EXAMINATION:  Physical Exam  Vitals reviewed. Constitutional: She is oriented to person, place, and time. She appears well-developed and well-nourished. No distress.  HENT:  Head: Normocephalic and atraumatic.  Mouth/Throat: Oropharynx is clear and moist.  Eyes: Pupils are equal, round, and reactive to light. Conjunctivae and EOM are normal. No scleral icterus.  Neck: Normal range of motion. Neck supple. No JVD present. No thyromegaly present.  Cardiovascular: Normal rate, regular rhythm and intact distal pulses. Exam reveals no gallop and no friction rub.  No murmur heard. Respiratory: Effort normal. No respiratory distress. She has wheezes. She has no rales. She exhibits tenderness.  GI: Soft. Bowel sounds are normal. She exhibits no distension. There is no abdominal tenderness.  Musculoskeletal: Normal range of motion.        General: No edema.     Comments: No arthritis, no gout  Lymphadenopathy:    She has no cervical adenopathy.   Neurological: She is alert and oriented to person, place, and time. No cranial nerve deficit.  No dysarthria, no aphasia  Skin: Skin is warm and dry. No rash noted. No erythema.  Psychiatric: She has a normal mood and affect. Her behavior is normal. Judgment and  thought content normal.    LABORATORY PANEL:   CBC Recent Labs  Lab 09/05/19 1651  WBC 9.5  HGB 8.5*  HCT 26.3*  PLT 137*   ------------------------------------------------------------------------------------------------------------------  Chemistries  Recent Labs  Lab 09/05/19 1651  NA 141  K 3.9  CL 100  CO2 30  GLUCOSE 226*  BUN 30*  CREATININE 5.50*  CALCIUM 8.2*   ------------------------------------------------------------------------------------------------------------------  Cardiac Enzymes No results for input(s): TROPONINI in the last 168 hours. ------------------------------------------------------------------------------------------------------------------  RADIOLOGY:  Dg Chest Port 1 View  Result Date: 09/05/2019 CLINICAL DATA:  Shortness of breath EXAM: PORTABLE CHEST 1 VIEW COMPARISON:  05/23/2019 FINDINGS: Tortuosity of the thoracic aorta with diffuse calcifications. Aorta is normal size. Low lung volumes with mild peribronchial thickening and interstitial prominence. No effusions. No acute bony abnormality. IMPRESSION: Mild peribronchial thickening and interstitial prominence may reflect bronchitic changes. Electronically Signed   By: Rolm Baptise M.D.   On: 09/05/2019 17:12    EKG:   Orders placed or performed during the hospital encounter of 09/05/19  . EKG 12-Lead  . EKG 12-Lead  . ED EKG  . ED EKG    IMPRESSION AND PLAN:  Principal Problem:   COPD with acute bronchitis ([place]) -we will give her steroids, as needed inhalers, azithromycin, continue home meds, as needed supportive treatment Active Problems:   End stage renal disease on dialysis [facility]) -nephrology consult for dialysis  support   Type 2 diabetes mellitus with other specified complication (HCC) -sliding scale insulin coverage   Chronic diastolic heart failure ([place]) -continue home medications   HTN (hypertension) -home dose antihypertensives   Hyperlipidemia -Home dose antilipid  Chart review performed and case discussed with ED provider. Labs, imaging and/or ECG reviewed by provider and discussed with patient/family. Management plans discussed with the patient and/or family.  COVID-19 status: Pending  DVT PROPHYLAXIS: SubQ lovenox   GI PROPHYLAXIS:  None  ADMISSION STATUS: Inpatient      CODE STATUS: Full Code Status History    Date Active Date Inactive Code Status Order ID Comments User Context   05/24/2019 0134 05/26/2019 1628 Full Code [id number]  Mansy, Arvella Merles, MD ED   05/05/2019 0152 05/08/2019 2121 Full Code [id number]  Lance Coon, MD Inpatient   11/29/2018 0554 11/29/2018 2201 Full Code [id number]  Lance Coon, MD ED   10/21/2018 2235 10/23/2018 1643 Full Code [id number]  Lance Coon, MD Inpatient   03/16/2018 0806 03/17/2018 1637 Full Code [id number]  Loletha Grayer, MD ED   01/16/2018 1630 01/18/2018 2158 Full Code [id number]  Dustin Flock, MD Inpatient   01/16/2018 1340 01/16/2018 1630 DNR [id number]  Dustin Flock, MD ED   09/18/2017 1348 09/19/2017 1513 DNR [id number]  Loletha Grayer, MD ED   04/12/2015 1056 04/12/2015 1631 Full Code [id number]  Dew, Erskine Squibb, MD Inpatient   Advance Care Planning Activity      TOTAL TIME TAKING CARE OF THIS PATIENT: 45 minutes.   This patient was evaluated in the context of the global COVID-19 pandemic, which necessitated consideration that the patient might be at risk for infection with the SARS-CoV-2 virus that causes COVID-19. Institutional protocols and algorithms that pertain to the evaluation of patients at risk for COVID-19 are in a state of rapid change based on information released by regulatory bodies including the CDC and federal and state organizations.  These policies and algorithms were followed to the best of this provider's knowledge to date during the patient's care at this facility.  Ethlyn Daniels 09/05/2019, 10:27 PM  [place]  [phone]  CC: Primary care physician; [name], MD  Note:  This document was prepared using Dragon voice recognition software and may include unintentional dictation errors.

## 2019-09-05 NOTE — ED Triage Notes (Signed)
Pt presents to ED via [facility] from home c/o sudden onset SOB. EMS report initial O2 sat 74% on RA, 88% on 4L, 95% on NRB. Hx COPD, DM, on dialysis. Last dialysis treatment yesterday, pt states she completed treatment. Pt given x1 duoneb tx at home with some relief of symptoms.

## 2019-09-05 NOTE — ED Notes (Signed)
Reported to Dr Jimmye Norman about Pt rating current pain at 7 out of 10.

## 2019-09-05 NOTE — ED Notes (Signed)
Informed MD of critical trop of 69

## 2019-09-05 NOTE — ED Notes (Signed)
Assisted pt to restroom  

## 2019-09-05 NOTE — ED Notes (Signed)
This RN answered pt's call bell, she stated she felt like she was wheezing. This Rn informed Willis MD who is headed to bedside at this time.

## 2019-09-06 LAB — BASIC METABOLIC PANEL
Anion gap: 14 (ref 5–15)
BUN: 37 mg/dL — ABNORMAL HIGH (ref 8–23)
CO2: 24 mmol/L (ref 22–32)
Calcium: 8.2 mg/dL — ABNORMAL LOW (ref 8.9–10.3)
Chloride: 100 mmol/L (ref 98–111)
Creatinine, Ser: 5.91 mg/dL — ABNORMAL HIGH (ref 0.44–1.00)
GFR calc Af Amer: 8 mL/min — ABNORMAL LOW (ref >60)
GFR calc non Af Amer: 7 mL/min — ABNORMAL LOW (ref >60)
Glucose, Bld: 218 mg/dL — ABNORMAL HIGH (ref 70–99)
Potassium: 4.4 mmol/L (ref 3.5–5.1)
Sodium: 138 mmol/L (ref 135–145)

## 2019-09-06 LAB — GLUCOSE, CAPILLARY
Glucose-Capillary: 141 mg/dL — ABNORMAL HIGH (ref 70–99)
Glucose-Capillary: 166 mg/dL — ABNORMAL HIGH (ref 70–99)
Glucose-Capillary: 197 mg/dL — ABNORMAL HIGH (ref 70–99)
Glucose-Capillary: 228 mg/dL — ABNORMAL HIGH (ref 70–99)

## 2019-09-06 LAB — CBC
HCT: 25.6 % — ABNORMAL LOW (ref 36.0–46.0)
Hemoglobin: 8.3 g/dL — ABNORMAL LOW (ref 12.0–15.0)
MCH: 29.6 pg (ref 26.0–34.0)
MCHC: 32.4 g/dL (ref 30.0–36.0)
MCV: 91.4 fL (ref 80.0–100.0)
Platelets: 131 10*3/uL — ABNORMAL LOW (ref 150–400)
RBC: 2.8 MIL/uL — ABNORMAL LOW (ref 3.87–5.11)
RDW: 13.1 % (ref 11.5–15.5)
WBC: 6.1 10*3/uL (ref 4.0–10.5)
nRBC: 0 % (ref 0.0–0.2)

## 2019-09-06 LAB — SARS CORONAVIRUS 2 (TAT 6-24 HRS): SARS Coronavirus 2: NEGATIVE

## 2019-09-06 LAB — TROPONIN I (HIGH SENSITIVITY)
Troponin I (High Sensitivity): 109 ng/L (ref <18)
Troponin I (High Sensitivity): 111 ng/L (ref <18)

## 2019-09-06 MED ORDER — LORATADINE 10 MG PO TABS
10.0000 mg | ORAL_TABLET | Freq: Every day | ORAL | Status: DC
  Administered 2019-09-06 – 2019-09-07 (×2): 10 mg via ORAL
  Filled 2019-09-06 (×2): qty 1

## 2019-09-06 MED ORDER — METHYLPREDNISOLONE SODIUM SUCC 125 MG IJ SOLR
60.0000 mg | Freq: Every day | INTRAMUSCULAR | Status: DC
  Administered 2019-09-07: 60 mg via INTRAVENOUS
  Filled 2019-09-06: qty 2

## 2019-09-06 MED ORDER — EPOETIN ALFA 10000 UNIT/ML IJ SOLN: 10000.0000 [IU] | INTRAMUSCULAR | Status: DC

## 2019-09-06 MED ORDER — LOSARTAN POTASSIUM 50 MG PO TABS
50.0000 mg | ORAL_TABLET | Freq: Every day | ORAL | Status: DC
  Administered 2019-09-07: 08:00:00 50 mg via ORAL
  Filled 2019-09-06: qty 1

## 2019-09-06 MED ORDER — PENTAFLUOROPROP-TETRAFLUOROETH EX AERO
1.0000 "application " | INHALATION_SPRAY | CUTANEOUS | Status: DC | PRN
  Filled 2019-09-06: qty 30

## 2019-09-06 MED ORDER — HYDRALAZINE HCL 50 MG PO TABS
100.0000 mg | ORAL_TABLET | Freq: Three times a day (TID) | ORAL | Status: DC
  Administered 2019-09-07 (×2): 100 mg via ORAL
  Filled 2019-09-06 (×4): qty 2

## 2019-09-06 MED ORDER — TOPIRAMATE 25 MG PO TABS
25.0000 mg | ORAL_TABLET | ORAL | Status: DC | PRN
  Filled 2019-09-06: qty 1

## 2019-09-06 MED ORDER — LIDOCAINE-PRILOCAINE 2.5-2.5 % EX CREA
1.0000 "application " | TOPICAL_CREAM | CUTANEOUS | Status: DC | PRN
  Filled 2019-09-06: qty 5

## 2019-09-06 MED ORDER — ALTEPLASE 2 MG IJ SOLR: 2.0000 mg | Freq: Once | INTRAMUSCULAR | Status: DC | PRN

## 2019-09-06 MED ORDER — LIDOCAINE HCL (PF) 1 % IJ SOLN
5.0000 mL | INTRAMUSCULAR | Status: DC | PRN
  Filled 2019-09-06: qty 5

## 2019-09-06 MED ORDER — HYDROCODONE-ACETAMINOPHEN 7.5-325 MG PO TABS
1.0000 | ORAL_TABLET | Freq: Four times a day (QID) | ORAL | Status: DC | PRN
  Administered 2019-09-06 (×2): 1 via ORAL
  Filled 2019-09-06: qty 1

## 2019-09-06 MED ORDER — HEPARIN SODIUM (PORCINE) 1000 UNIT/ML DIALYSIS: 1000.0000 [IU] | INTRAMUSCULAR | Status: DC | PRN

## 2019-09-06 MED ORDER — SODIUM CHLORIDE 0.9 % IV SOLN: 100.0000 mL | INTRAVENOUS | Status: DC | PRN

## 2019-09-06 MED ORDER — CHLORHEXIDINE GLUCONATE CLOTH 2 % EX PADS: 6.0000 | MEDICATED_PAD | Freq: Every day | CUTANEOUS | Status: DC

## 2019-09-06 MED ORDER — OXYBUTYNIN CHLORIDE ER 5 MG PO TB24
15.0000 mg | ORAL_TABLET | Freq: Every day | ORAL | Status: DC
  Administered 2019-09-06 – 2019-09-07 (×2): 15 mg via ORAL
  Filled 2019-09-06 (×2): qty 3

## 2019-09-06 NOTE — Progress Notes (Signed)
[facility]  ROUNDING NOTE   Subjective:  Patient well-known to [place] as we follow her for outpatient hemodialysis. She comes in with now with chest pain and shortness of breath. Due for hemodialysis today.   Objective:  Vital signs in last 24 hours:  Temp:  [97.9 F (36.6 C)-99.8 F (37.7 C)] 99.8 F (37.7 C) (10/17 1334) Pulse Rate:  [71-92] 86 (10/17 1334) Resp:  [12-24] 18 (10/17 1334) BP: (118-157)/(45-95) 133/53 (10/17 1334) SpO2:  [97 %-100 %] 99 % (10/17 1334) Weight:  [59.4 kg-60.1 kg] 60.1 kg (10/16 2320)  Weight change:  Filed Weights   09/05/19 1648 09/05/19 2320  Weight: 59.4 kg 60.1 kg    Intake/Output: No intake/output data recorded.   Intake/Output this shift:  Total I/O In: 480 [P.O.:480] Out: -   Physical Exam: General: No acute distress  Head: Normocephalic, atraumatic. Moist oral mucosal membranes  Eyes: Anicteric  Neck: Supple, trachea midline  Lungs:  Clear to auscultation, normal effort  Heart: S1S2 no rubs  Abdomen:  Soft, nontender, bowel sounds present  Extremities: Trace peripheral edema.  Neurologic: Awake, alert, following commands  Skin: No lesions  Access: Left upper extremity AV fistula    Basic Metabolic Panel: Recent Labs  Lab 09/05/19 1651 09/06/19 0119  NA 141 138  K 3.9 4.4  CL 100 100  CO2 30 24  GLUCOSE 226* 218*  BUN 30* 37*  CREATININE 5.50* 5.91*  CALCIUM 8.2* 8.2*    Liver Function Tests: No results for input(s): AST, ALT, ALKPHOS, BILITOT, PROT, ALBUMIN in the last 168 hours. No results for input(s): LIPASE, AMYLASE in the last 168 hours. No results for input(s): AMMONIA in the last 168 hours.  CBC: Recent Labs  Lab 09/05/19 1651 09/06/19 0119  WBC 9.5 6.1  NEUTROABS 8.0*  --   HGB 8.5* 8.3*  HCT 26.3* 25.6*  MCV 92.0 91.4  PLT 137* 131*    Cardiac Enzymes: No results for input(s): CKTOTAL, CKMB, CKMBINDEX, TROPONINI in the last 168 hours.  BNP: Invalid input(s):  POCBNP  CBG: Recent Labs  Lab 09/06/19 0023 09/06/19 0813 09/06/19 1207  GLUCAP 197* 166* 228*    Microbiology: Results for orders placed or performed during the hospital encounter of 09/05/19  SARS CORONAVIRUS 2 (TAT 6-24 HRS) Nasopharyngeal Nasopharyngeal Swab     Status: None   Collection Time: 09/05/19  4:54 PM   Specimen: Nasopharyngeal Swab  Result Value Ref Range Status   SARS Coronavirus 2 NEGATIVE NEGATIVE Final    Comment: (NOTE) SARS-CoV-2 target nucleic acids are NOT DETECTED. The SARS-CoV-2 RNA is generally detectable in upper and lower respiratory specimens during the acute phase of infection. Negative results do not preclude SARS-CoV-2 infection, do not rule out co-infections with other pathogens, and should not be used as the sole basis for treatment or other patient management decisions. Negative results must be combined with clinical observations, patient history, and epidemiological information. The expected result is Negative. Fact Sheet for Patients: SugarRoll.be Fact Sheet for Healthcare Providers: https://www.woods-mathews.com/ This test is not yet approved or cleared by the [place] FDA and  has been authorized for detection and/or diagnosis of SARS-CoV-2 by FDA under an Emergency Use Authorization (EUA). This EUA will remain  in effect (meaning this test can be used) for the duration of the COVID-19 declaration under Section 56 4(b)(1) of the Act, 21 U.S.C. section 360bbb-3(b)(1), unless the authorization is terminated or revoked sooner. Performed at [facility], [place] [address],  [place], [place] [address]     Coagulation Studies: No results for input(s): LABPROT, INR in the last 72 hours.  Urinalysis: No results for input(s): COLORURINE, LABSPEC, PHURINE, GLUCOSEU, HGBUR, BILIRUBINUR, KETONESUR, PROTEINUR, UROBILINOGEN, NITRITE, LEUKOCYTESUR in the last 72 hours.  Invalid input(s):  APPERANCEUR    Imaging: Dg Chest Port 1 View  Result Date: 09/05/2019 CLINICAL DATA:  Shortness of breath EXAM: PORTABLE CHEST 1 VIEW COMPARISON:  05/23/2019 FINDINGS: Tortuosity of the thoracic aorta with diffuse calcifications. Aorta is normal size. Low lung volumes with mild peribronchial thickening and interstitial prominence. No effusions. No acute bony abnormality. IMPRESSION: Mild peribronchial thickening and interstitial prominence may reflect bronchitic changes. Electronically Signed   By: Rolm Baptise M.D.   On: 09/05/2019 17:12     Medications:    . amLODipine  10 mg Oral Daily  . azithromycin  500 mg Oral Daily  . calcium acetate  667 mg Oral TID WC  . carvedilol  12.5 mg Oral BID WC  . clopidogrel  75 mg Oral Daily  . furosemide  80 mg Oral Daily  . heparin  5,000 Units Subcutaneous Q8H  . hydrALAZINE  100 mg Oral TID  . insulin aspart  0-5 Units Subcutaneous QHS  . insulin aspart  0-9 Units Subcutaneous TID WC  . loratadine  10 mg Oral Daily  . losartan  50 mg Oral Daily  . [START ON 09/07/2019] methylPREDNISolone (SOLU-MEDROL) injection  60 mg Intravenous Daily  . mometasone-formoterol  2 puff Inhalation BID  . oxybutynin  15 mg Oral Daily  . pantoprazole  40 mg Oral BID   acetaminophen **OR** acetaminophen, guaiFENesin-dextromethorphan, HYDROcodone-acetaminophen, Ipratropium-Albuterol, ondansetron **OR** ondansetron (ZOFRAN) IV, topiramate  Assessment/ Plan:  73 y.o. female with end stage renal disease on hemodialysis, hypertension, diabetes mellitus type II, overactive bladder, gout, COPD, congestive heart failure, coronary artery disease  [facility] TTS [address] 58.3kg Left AVF  1.  ESRD on HD TTS.  Patient due for hemodialysis today.  Orders have been prepared.  Ultrafiltration target 1.5 kg.  2.  Hypertension.  Maintain the patient on amlodipine, carvedilol, hydralazine, and losartan for blood pressure control.  3.  Anemia of chronic kidney disease.   Hemoglobin 8.3.  Administer Epogen 10,000 units IV with dialysis today.  4.  Secondary hyperparathyroidism.  We will plan to check phosphorus and PTH today.   LOS: 1 Mikolaj Woolstenhulme 10/17/20202:49 PM

## 2019-09-06 NOTE — Progress Notes (Signed)
RN informed MD, Dr. Marcille Blanco, or Troponin of 111, no new orders received.

## 2019-09-06 NOTE — Progress Notes (Signed)
Patient off the floor,  transported by orderly to Dialysis.

## 2019-09-06 NOTE — Progress Notes (Signed)
Physical Therapy Evaluation Patient Details Name: Phyllis [name] MRN: [id number] DOB: [birth year] Today's Date: 09/06/2019   History of Present Illness  Phyllis [name]  is a 73 y.o. female who presents with chief complaint  of shortness of breath and chest discomfort.   Clinical Impression  Patient performs bed mobility and transfers, with MI and supervision with RW. She aambulates with RW for 65 feet with decreases in O2 saturation down to 81 percent , and HR increase to 129 BPM from 100 BPM. She recovers with O2 saturation back to 99% with 1 minute rest period and HR decreased back to 100 with a rest. She does report chest pain following ambulation from pushing on the RW. She will continue to benefit from skilled PT to improve mobility and strength.    Follow Up Recommendations Home health PT    Equipment Recommendations  None recommended by PT    Recommendations for Other Services       Precautions / Restrictions Restrictions Weight Bearing Restrictions: No      Mobility  Bed Mobility Overal bed mobility: Modified Independent                Transfers Overall transfer level: Modified independent               General transfer comment: cues for safety  Ambulation/Gait Ambulation/Gait assistance: Modified independent (Device/Increase time) Gait Distance (Feet): 65 Feet Assistive device: Rolling walker (2 wheeled) Gait Pattern/deviations: Step-to pattern     General Gait Details: (decreased gait speed)  Stairs            Wheelchair Mobility    Modified Rankin (Stroke Patients Only)       Balance Overall balance assessment: Modified Independent                                           Pertinent Vitals/Pain Pain Assessment: 0-10 Pain Score: 4  Pain Location: (chest/ribs) Pain Descriptors / Indicators: Aching Pain Intervention(s): Limited activity within patient's tolerance;Monitored during session    Home Living  Family/patient expects to be discharged to:: Private residence Living Arrangements: Non-relatives/Friends Available Help at Discharge: Friend(s) Type of Home: House Home Access: Stairs to enter Entrance Stairs-Rails: Right Entrance Stairs-Number of Steps: (4) Home Layout: Multi-level;Able to live on main level with bedroom/bathroom Home Equipment: Gilford Rile - 2 wheels;Shower seat      Prior Function Level of Independence: Independent with assistive device(s)               Hand Dominance        Extremity/Trunk Assessment   Upper Extremity Assessment Upper Extremity Assessment: Overall WFL for tasks assessed    Lower Extremity Assessment Lower Extremity Assessment: Overall WFL for tasks assessed       Communication   Communication: No difficulties  Cognition Arousal/Alertness: Awake/alert Behavior During Therapy: WFL for tasks assessed/performed;Flat affect Overall Cognitive Status: Within Functional Limits for tasks assessed                                        General Comments      Exercises     Assessment/Plan    PT Assessment Patient needs continued PT services  PT Problem List Decreased strength;Decreased activity tolerance;Decreased mobility;Pain       PT Treatment Interventions  Gait training;Therapeutic activities;Therapeutic exercise    PT Goals (Current goals can be found in the Care Plan section)  Acute Rehab PT Goals Patient Stated Goal: (to go home) PT Goal Formulation: With patient Time For Goal Achievement: 09/20/19 Potential to Achieve Goals: Good    Frequency Min 2X/week   Barriers to discharge        Co-evaluation               AM-PAC PT "6 Clicks" Mobility  Outcome Measure Help needed turning from your back to your side while in a flat bed without using bedrails?: A Little Help needed moving from lying on your back to sitting on the side of a flat bed without using bedrails?: A Little Help needed moving  to and from a bed to a chair (including a wheelchair)?: A Little Help needed standing up from a chair using your arms (e.g., wheelchair or bedside chair)?: A Little Help needed to walk in hospital room?: A Little Help needed climbing 3-5 steps with a railing? : A Little 6 Click Score: 18    End of Session Equipment Utilized During Treatment: Gait belt Activity Tolerance: Patient limited by fatigue;Patient limited by pain Patient left: with bed alarm set Nurse Communication: Mobility status PT Visit Diagnosis: Muscle weakness (generalized) (M62.81);Difficulty in walking, not elsewhere classified (R26.2)    Time: 1045-1110 PT Time Calculation (min) (ACUTE ONLY): 25 min   Charges:   PT Evaluation $PT Eval Low Complexity: 1 Low PT Treatments $Gait Training: 8-22 mins          Alanson Puls, PT DPT 09/06/2019, 1:41 PM

## 2019-09-06 NOTE — Progress Notes (Signed)
[place] [facility] [place] NAME: Phyllis [name]    MR#:  [id number]  DATE OF BIRTH:  1946/05/15  SUBJECTIVE:   Pt. Here due to shortness of breath and suspected to COPD Exacerbation.  Shortness of breath, chest pain has improved.  Patient denies any other complaints presently.  Overall feels better since yesterday.  REVIEW OF SYSTEMS:    Review of Systems  Constitutional: Negative for chills and fever.  HENT: Negative for congestion and tinnitus.   Eyes: Negative for blurred vision and double vision.  Respiratory: Positive for cough and shortness of breath. Negative for wheezing.   Cardiovascular: Negative for chest pain, orthopnea and PND.  Gastrointestinal: Negative for abdominal pain, diarrhea, nausea and vomiting.  Genitourinary: Negative for dysuria and hematuria.  Neurological: Negative for dizziness, sensory change and focal weakness.  All other systems reviewed and are negative.   Nutrition: Heart Healthy/Carb control Tolerating Diet: Yes Tolerating PT: Ambulatory   DRUG ALLERGIES:   Allergies  Allergen Reactions  . Enalapril Maleate Other (See Comments)    Other reaction(s): Headache  . Nitrofurantoin Swelling and Rash    Other Reaction: swelling of body  . Sulfamethoxazole-Trimethoprim Swelling  . 2,4-D Dimethylamine (Amisol) Rash and Other (See Comments)    Other Reaction: h/a  . Baclofen Other (See Comments) and Nausea Only    lightheadness ,drowsiness , muscle weakness , twitching in hands   . Neosporin [Neomycin-Bacitracin Zn-Polymyx] Other (See Comments) and Rash    Other Reaction: irritation Skin irritation   . Quinine Nausea And Vomiting, Rash and Other (See Comments)    Other Reaction: Vomiting, rash, h/a, vision  . Ultram [Tramadol] Palpitations  . Zocor [Simvastatin] Other (See Comments) and Rash    Other Reaction: muscle spasms Muscle pain and spasms  . Bactrim [Sulfamethoxazole-Trimethoprim] Swelling   . Levodopa Other (See Comments)    Reaction: unknown  . Macrodantin [Nitrofurantoin Macrocrystal] Swelling  . Quinine Derivatives Other (See Comments)    Vertigo,nausea vomiting blurred vision headache ears sensitivity     VITALS:  Blood pressure (!) 118/45, pulse 71, temperature 97.9 F (36.6 C), temperature source Oral, resp. rate 18, height 4\' 10"  (1.473 m), weight 60.1 kg, SpO2 98 %.  PHYSICAL EXAMINATION:   Physical Exam  GENERAL:  73 y.o.-year-old obese patient lying in bed in no acute distress.  EYES: Pupils equal, round, reactive to light and accommodation. No scleral icterus. Extraocular muscles intact.  HEENT: Head atraumatic, normocephalic. Oropharynx and nasopharynx clear.  NECK:  Supple, no jugular venous distention. No thyroid enlargement, no tenderness.  LUNGS: Good air entry bilaterally, no rales, rhonchi, wheezing.  Negative use of accessory muscles. CARDIOVASCULAR: S1, S2 normal. No murmurs, rubs, or gallops.  ABDOMEN: Soft, nontender, nondistended. Bowel sounds present. No organomegaly or mass.  EXTREMITIES: No cyanosis, clubbing or edema b/l.    NEUROLOGIC: Cranial nerves II through XII are intact. No focal Motor or sensory deficits b/l.   PSYCHIATRIC: The patient is alert and oriented x 3.  SKIN: No obvious rash, lesion, or ulcer.    LABORATORY PANEL:   CBC Recent Labs  Lab 09/06/19 0119  WBC 6.1  HGB 8.3*  HCT 25.6*  PLT 131*   ------------------------------------------------------------------------------------------------------------------  Chemistries  Recent Labs  Lab 09/06/19 0119  NA 138  K 4.4  CL 100  CO2 24  GLUCOSE 218*  BUN 37*  CREATININE 5.91*  CALCIUM 8.2*   ------------------------------------------------------------------------------------------------------------------  Cardiac Enzymes No results for input(s): TROPONINI in  the last 168 hours.  ------------------------------------------------------------------------------------------------------------------  RADIOLOGY:  Dg Chest Port 1 View  Result Date: 09/05/2019 CLINICAL DATA:  Shortness of breath EXAM: PORTABLE CHEST 1 VIEW COMPARISON:  05/23/2019 FINDINGS: Tortuosity of the thoracic aorta with diffuse calcifications. Aorta is normal size. Low lung volumes with mild peribronchial thickening and interstitial prominence. No effusions. No acute bony abnormality. IMPRESSION: Mild peribronchial thickening and interstitial prominence may reflect bronchitic changes. Electronically Signed   By: Rolm Baptise M.D.   On: 09/05/2019 17:12     ASSESSMENT AND PLAN:   73 year old female with past medical history of end-stage renal disease on hemodialysis, COPD, essential hypertension, hyperlipidemia, obstructive sleep apnea, aortic stenosis, anemia of chronic disease who presented to the hospital due to shortness of breath.  1.  COPD exacerbation-this is a cause of patient's worsening shortness of breath. She much improved since yesterday.  Patient has less wheezing and bronchospasm.  Continue IV steroids but will taper. -Continue Dulera, Combivent as needed  2.  End-stage renal disease on hemodialysis-patient gets dialysis on Tuesday Thursday for Saturday -Nephrology consulted.  Continue further care as per them.  3.  Essential hypertension-continue carvedilol, Norvasc, hydralazine, losartan.  Blood pressure stable.  4.  Secondary hyperparathyroidism-continue PhosLo.  5.  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  Follow blood sugars.  6.  GERD-continue Protonix.   All the records are reviewed and case discussed with Care Management/Social Worker. Management plans discussed with the patient, family and they are in agreement.  CODE STATUS: Full code  DVT Prophylaxis: Hep. SQ  TOTAL TIME TAKING CARE OF THIS PATIENT: 30 minutes.   POSSIBLE D/C  IN 1-2 DAYS, DEPENDING ON CLINICAL CONDITION.   Henreitta Leber M.D on 09/06/2019 at 11:58 AM  Between 7am to 6pm - Pager - [phone]  After 6pm go to [email or web address] - Proofreader  [facility]  Office  [phone]  CC: Primary care physician; [name], MD

## 2019-09-06 NOTE — Progress Notes (Signed)
This note also relates to the following rows which could not be included: Pulse Rate - Cannot attach notes to unvalidated device data Resp - Cannot attach notes to unvalidated device data  Hd started  

## 2019-09-06 NOTE — Progress Notes (Signed)
RN informed MD,  Dr. Jannifer Franklin of Troponin of 109.

## 2019-09-07 LAB — GLUCOSE, CAPILLARY
Glucose-Capillary: 114 mg/dL — ABNORMAL HIGH (ref 70–99)
Glucose-Capillary: 168 mg/dL — ABNORMAL HIGH (ref 70–99)

## 2019-09-07 LAB — HEPATITIS B SURFACE ANTIGEN: Hepatitis B Surface Ag: NONREACTIVE

## 2019-09-07 MED ORDER — AZITHROMYCIN 250 MG PO TABS: 250.0000 mg | ORAL_TABLET | Freq: Every day | ORAL | 0 refills | Status: DC

## 2019-09-07 MED ORDER — FLUTICASONE-SALMETEROL 250-50 MCG/DOSE IN AEPB: 1.0000 | INHALATION_SPRAY | Freq: Two times a day (BID) | RESPIRATORY_TRACT | Status: DC | PRN

## 2019-09-07 MED ORDER — OMEPRAZOLE 20 MG PO CPDR: 20.0000 mg | DELAYED_RELEASE_CAPSULE | Freq: Two times a day (BID) | ORAL | Status: DC

## 2019-09-07 MED ORDER — TOPIRAMATE 25 MG PO TABS: 25.0000 mg | ORAL_TABLET | ORAL | Status: DC | PRN

## 2019-09-07 MED ORDER — PREDNISONE 10 MG PO TABS: ORAL_TABLET | ORAL | 0 refills | Status: DC

## 2019-09-07 NOTE — TOC Transition Note (Signed)
Transition of Care [facility]) - CM/SW Discharge Note   Patient Details  Name: Phyllis [name] MRN: [id number] Date of Birth: [birth year]  [facility] of [facility]) CM/SW Contact:  Grabiel Schmutz, Lenice Llamas Phone Number: [phone]  09/07/2019, 12:10 PM   Clinical Narrative: Clinical Social Worker (CSW) met with patient alone at bedside to discuss D/C plan. Patient was alert and oriented X4 and was laying in the bed. CSW introduced self and explained role of CSW department. Per patient she lives in [name] with her friend [name]. Patient reported that she does not have any adult children. Patient reported that she has a walker at home. Per patient she uses her walker to ambulate at baseline. CSW explained home health options and provided patient home health CMS list. Patient chose [facility]. Per patient she has used [facility] in the past and had a good experience. Per [facility] representative they can accept patient. [name] is aware that patient will D/C home today with home health PT and aide. Patient reported that her friend [name] will pick her up from [name] today. RN aware of above. Please reconsult if future social work needs arise. CSW signing off.      Final next level of care: [name] Barriers to Discharge: Barriers Resolved   Patient Goals and CMS Choice Patient states their goals for this hospitalization and ongoing recovery are:: To go home. CMS Medicare.gov Compare Post Acute Care list provided to:: Patient Choice offered to / list presented to : Patient  Discharge Placement                       Discharge Plan and Services In-house Referral: Clinical Social Work   Post Acute Care Choice: Home Health          DME Arranged: N/A         HH Arranged: PT, Nurse's Aide [name] Agency: [place] ([facility]) Date HH Agency Contacted: 09/07/19 Time [name]: 1120 Representative spoke with at [place]:  [place] ([place]) Interventions     Readmission Risk Interventions Readmission Risk Prevention Plan 05/07/2019  Transportation Screening Complete  Medication Review [employer]) Complete  PCP or Specialist appointment within 3-5 days of discharge Complete  HRI or Home Care Consult Complete  [place] Not Applicable  Some recent data might be hidden

## 2019-09-07 NOTE — Discharge Summary (Signed)
[facility] [place] NAME: Phyllis [name]    MR#:  [id number]  DATE OF BIRTH:  Feb 23, 1946  DATE OF ADMISSION:  09/05/2019 ADMITTING PHYSICIAN: Lance Coon, MD  DATE OF DISCHARGE: 09/07/2019  PRIMARY CARE PHYSICIAN: [name], MD    ADMISSION DIAGNOSIS:  Shortness of breath [R06.02] End stage renal disease ([place]) [N18.6] Chest pain, unspecified type [R07.9]  DISCHARGE DIAGNOSIS:  Principal Problem:   COPD with acute bronchitis ([place]) Active Problems:   End stage renal disease on dialysis ([place])   Type 2 diabetes mellitus with other specified complication (HCC)   Hyperlipidemia   Chronic diastolic heart failure (HCC)   HTN (hypertension)   SECONDARY DIAGNOSIS:   Past Medical History:  Diagnosis Date  . (HFpEF) heart failure with preserved ejection fraction ([place])    a. TTE 01/2014: nl LV sys fxn, no valvular abnormalities; b. TTE 11/16: nl EF, mild LVH;  c. 04/2019 Echo: EF 60-65%. DD. Nl RV fxn. Mod AS. 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  . Allergy   . Anemia of chronic disease   . Anxiety   . Aortic atherosclerosis ([place])   . Asthma   . Chronic back pain   . COPD (chronic obstructive pulmonary disease) ([place])   . Diabetes mellitus with complication ([place])   . ESRD on hemodialysis ([place])    a. Tues/Sat; b. 2/2 small kidneys  . Essential hypertension   . Fistula    lower left arm  . GERD (gastroesophageal reflux disease)   . Gout   . History of exercise stress test    a. 01/2014: no evidence of ischemia; b. Lexiscan 08/2015: no sig ischemia, severe GI uptake artifact, low risk; c. CPET @ [facility] 09/2016: exercised 3 min 12 sec on bike without incline, 2.28 METs, VO2 of 8.1, 48% of predicted, indicating mod to sev functional impairment, evidence of blunted HR, stroke volume, and BP augmentation as well as ventilation-perfusion mismatch with exercise  . HLD (hyperlipidemia)   . Moderate aortic stenosis    a.  04/2019 Echo: Mod AS.  . Non-obstructive Carotid arterial disease ([place])    a. 12/2017: <50% bilat ICA dzs.  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 Sleep apnea     HOSPITAL COURSE:   73 year old female with past medical history of end-stage renal disease on hemodialysis, COPD, essential hypertension, hyperlipidemia, obstructive sleep apnea, aortic stenosis, anemia of chronic disease who presented to the hospital due to shortness of breath.  1.  COPD exacerbation-this was the cause of patient's worsening shortness of breath. -Patient's chest x-ray did not show any evidence of acute pneumonia.  Patient was treated empirically with IV steroids, scheduled duo nebs, Pulmicort nebs. -She has improved.  She is now being discharged on oral prednisone taper and some oral Zithromax.. - she will Continue Dulera, Combivent as needed  2.  End-stage renal disease on hemodialysis-patient was dialyzed while in the hospital on her Tuesday Thursday Saturday schedule.  She tolerated well.  She will resume her schedule upon discharge.  3.  Essential hypertension- she will continue carvedilol, Norvasc, hydralazine, losartan.  Blood pressure stable.  4.  Secondary hyperparathyroidism- pt. Will continue PhosLo.  5. Hx of Urinary Incontinence - cont. Oxybutynin.   6.  GERD- pt. Will cont. Omeprazole.    Stable to be discharged home today.   DISCHARGE CONDITIONS:   Stable.   CONSULTS OBTAINED:  Treatment Team:  Anthonette Legato,  MD  DRUG ALLERGIES:   Allergies  Allergen Reactions  . Enalapril Maleate Other (See Comments)    Other reaction(s): Headache  . Nitrofurantoin Swelling and Rash    Other Reaction: swelling of body  . Sulfamethoxazole-Trimethoprim Swelling  . 2,4-D Dimethylamine (Amisol) Rash and Other (See Comments)    Other Reaction: h/a  . Baclofen Other (See Comments) and Nausea Only    lightheadness ,drowsiness , muscle weakness , twitching in hands   .  Neosporin [Neomycin-Bacitracin Zn-Polymyx] Other (See Comments) and Rash    Other Reaction: irritation Skin irritation   . Quinine Nausea And Vomiting, Rash and Other (See Comments)    Other Reaction: Vomiting, rash, h/a, vision  . Ultram [Tramadol] Palpitations  . Zocor [Simvastatin] Other (See Comments) and Rash    Other Reaction: muscle spasms Muscle pain and spasms  . Bactrim [Sulfamethoxazole-Trimethoprim] Swelling  . Levodopa Other (See Comments)    Reaction: unknown  . Macrodantin [Nitrofurantoin Macrocrystal] Swelling  . Quinine Derivatives Other (See Comments)    Vertigo,nausea vomiting blurred vision headache ears sensitivity     DISCHARGE MEDICATIONS:   Allergies as of 09/07/2019      Reactions   Enalapril Maleate Other (See Comments)   Other reaction(s): Headache   Nitrofurantoin Swelling, Rash   Other Reaction: swelling of body   Sulfamethoxazole-trimethoprim Swelling   2,4-d Dimethylamine (amisol) Rash, Other (See Comments)   Other Reaction: h/a   Baclofen Other (See Comments), Nausea Only   lightheadness ,drowsiness , muscle weakness , twitching in hands    Neosporin [neomycin-bacitracin Zn-polymyx] Other (See Comments), Rash   Other Reaction: irritation Skin irritation   Quinine Nausea And Vomiting, Rash, Other (See Comments)   Other Reaction: Vomiting, rash, h/a, vision   Ultram [tramadol] Palpitations   Zocor [simvastatin] Other (See Comments), Rash   Other Reaction: muscle spasms Muscle pain and spasms   Bactrim [sulfamethoxazole-trimethoprim] Swelling   Levodopa Other (See Comments)   Reaction: unknown   Macrodantin [nitrofurantoin Macrocrystal] Swelling   Quinine Derivatives Other (See Comments)   Vertigo,nausea vomiting blurred vision headache ears sensitivity      Medication List    TAKE these medications   albuterol 108 (90 Base) MCG/ACT inhaler Commonly known as: VENTOLIN HFA Inhale 2 puffs into the lungs every 6 (six) hours as needed for  wheezing or shortness of breath.   albuterol (2.5 MG/3ML) 0.083% nebulizer solution Commonly known as: PROVENTIL Take 3 mLs (2.5 mg total) by nebulization every 6 (six) hours as needed for wheezing or shortness of breath.   amLODipine 10 MG tablet Commonly known as: NORVASC Take 10 mg by mouth daily.   azithromycin 250 MG tablet Commonly known as: Zithromax Take 1 tablet (250 mg total) by mouth daily.   calcium acetate 667 MG capsule Commonly known as: PHOSLO Take 1,334 mg by mouth 3 (three) times daily with meals.   carvedilol 12.5 MG tablet Commonly known as: COREG Take 12.5 mg by mouth 2 (two) times daily with a meal.   cetirizine 10 MG tablet Commonly known as: ZYRTEC Take 10 mg by mouth daily as needed for allergies.   clopidogrel 75 MG tablet Commonly known as: PLAVIX Take 1 tablet (75 mg total) by mouth daily.   Fluticasone-Salmeterol 250-50 MCG/DOSE Aepb Commonly known as: ADVAIR Inhale 1 puff into the lungs 2 (two) times daily as needed (for shortness of breath).   furosemide 80 MG tablet Commonly known as: LASIX Take 1 tablet (80 mg total) by mouth daily.   hydrALAZINE  100 MG tablet Commonly known as: APRESOLINE Take 1 tablet (100 mg total) by mouth 3 (three) times daily.   HYDROcodone-acetaminophen 7.5-325 MG tablet Commonly known as: NORCO Take 1-2 tablets by mouth daily as needed (pain).   lidocaine-prilocaine cream Commonly known as: EMLA Apply 1 application topically as needed (prior to treatment).   losartan 50 MG tablet Commonly known as: COZAAR Take 50 mg by mouth daily.   omeprazole 20 MG capsule Commonly known as: PRILOSEC Take 1 capsule (20 mg total) by mouth 2 (two) times daily before a meal.   ondansetron 4 MG tablet Commonly known as: ZOFRAN Take 4 mg by mouth every 8 (eight) hours as needed.   oxybutynin 15 MG 24 hr tablet Commonly known as: DITROPAN XL Take 15 mg by mouth daily.   pravastatin 40 MG tablet Commonly known as:  PRAVACHOL Take 40 mg by mouth at bedtime.   predniSONE 10 MG tablet Commonly known as: DELTASONE Label  & dispense according to the schedule below. 5 Pills PO for 1 day then, 4 Pills PO for 1 day, 3 Pills PO for 1 day, 2 Pills PO for 1 day, 1 Pill PO for 1 days then STOP.   topiramate 25 MG tablet Commonly known as: TOPAMAX Take 1 tablet (25 mg total) by mouth as needed (for headaches).         DISCHARGE INSTRUCTIONS:   DIET:  Cardiac diet and Renal diet  DISCHARGE CONDITION:  Stable  ACTIVITY:  Activity as tolerated  OXYGEN:  Home Oxygen: No.   Oxygen Delivery: room air  DISCHARGE LOCATION:  home   If you experience worsening of your admission symptoms, develop shortness of breath, life threatening emergency, suicidal or homicidal thoughts you must seek medical attention immediately by calling 911 or calling your MD immediately  if symptoms less severe.  You Must read complete instructions/literature along with all the possible adverse reactions/side effects for all the Medicines you take and that have been prescribed to you. Take any new Medicines after you have completely understood and accpet all the possible adverse reactions/side effects.   Please note  You were cared for by a hospitalist during your hospital stay. If you have any questions about your discharge medications or the care you received while you were in the hospital after you are discharged, you can call the unit and asked to speak with the hospitalist on call if the hospitalist that took care of you is not available. Once you are discharged, your primary care physician will handle any further medical issues. Please note that NO REFILLS for any discharge medications will be authorized once you are discharged, as it is imperative that you return to your primary care physician (or establish a relationship with a primary care physician if you do not have one) for your aftercare needs so that they can reassess  your need for medications and monitor your lab values.     Today   No shortness of breath or chest pain.  Had dialysis yesterday and tolerated well.  Will discharge home today.  VITAL SIGNS:  Blood pressure (!) 123/48, pulse 79, temperature 98.2 F (36.8 C), temperature source Oral, resp. rate 18, height 4\' 10"  (1.473 m), weight 62.4 kg, SpO2 96 %.  I/O:    Intake/Output Summary (Last 24 hours) at 09/07/2019 1306 Last data filed at 09/07/2019 0000 Gross per 24 hour  Intake 120 ml  Output 1500 ml  Net -1380 ml    PHYSICAL EXAMINATION:  GENERAL:  73 y.o.-year-old obese patient lying in bed in no acute distress.  EYES: Pupils equal, round, reactive to light and accommodation. No scleral icterus. Extraocular muscles intact.  HEENT: Head atraumatic, normocephalic. Oropharynx and nasopharynx clear.  NECK:  Supple, no jugular venous distention. No thyroid enlargement, no tenderness.  LUNGS: Good air entry bilaterally, no rales, rhonchi, wheezing.  Negative use of accessory muscles. CARDIOVASCULAR: S1, S2 normal. No murmurs, rubs, or gallops.  ABDOMEN: Soft, nontender, nondistended. Bowel sounds present. No organomegaly or mass.  EXTREMITIES: No cyanosis, clubbing or edema b/l.    NEUROLOGIC: Cranial nerves II through XII are intact. No focal Motor or sensory deficits b/l.   PSYCHIATRIC: The patient is alert and oriented x 3.  SKIN: No obvious rash, lesion, or ulcer.   Left upper extremity AV fistula with good bruit and thrill.  DATA REVIEW:   CBC Recent Labs  Lab 09/06/19 0119  WBC 6.1  HGB 8.3*  HCT 25.6*  PLT 131*    Chemistries  Recent Labs  Lab 09/06/19 0119  NA 138  K 4.4  CL 100  CO2 24  GLUCOSE 218*  BUN 37*  CREATININE 5.91*  CALCIUM 8.2*    Cardiac Enzymes No results for input(s): TROPONINI in the last 168 hours.  Microbiology Results  Results for orders placed or performed during the hospital encounter of 09/05/19  SARS CORONAVIRUS 2 (TAT  6-24 HRS) Nasopharyngeal Nasopharyngeal Swab     Status: None   Collection Time: 09/05/19  4:54 PM   Specimen: Nasopharyngeal Swab  Result Value Ref Range Status   SARS Coronavirus 2 NEGATIVE NEGATIVE Final    Comment: (NOTE) SARS-CoV-2 target nucleic acids are NOT DETECTED. The SARS-CoV-2 RNA is generally detectable in upper and lower respiratory specimens during the acute phase of infection. Negative results do not preclude SARS-CoV-2 infection, do not rule out co-infections with other pathogens, and should not be used as the sole basis for treatment or other patient management decisions. Negative results must be combined with clinical observations, patient history, and epidemiological information. The expected result is Negative. Fact Sheet for Patients: SugarRoll.be Fact Sheet for Healthcare Providers: https://www.woods-mathews.com/ This test is not yet approved or cleared by the [place] FDA and  has been authorized for detection and/or diagnosis of SARS-CoV-2 by FDA under an Emergency Use Authorization (EUA). This EUA will remain  in effect (meaning this test can be used) for the duration of the COVID-19 declaration under Section 56 4(b)(1) of the Act, 21 U.S.C. section 360bbb-3(b)(1), unless the authorization is terminated or revoked sooner. Performed at [facility], [place] [address], [place], [place] [address]     RADIOLOGY:  Dg Chest Port 1 View  Result Date: 09/05/2019 CLINICAL DATA:  Shortness of breath EXAM: PORTABLE CHEST 1 VIEW COMPARISON:  05/23/2019 FINDINGS: Tortuosity of the thoracic aorta with diffuse calcifications. Aorta is normal size. Low lung volumes with mild peribronchial thickening and interstitial prominence. No effusions. No acute bony abnormality. IMPRESSION: Mild peribronchial thickening and interstitial prominence may reflect bronchitic changes. Electronically Signed   By: Rolm Baptise M.D.   On:  09/05/2019 17:12      Management plans discussed with the patient, family and they are in agreement.  CODE STATUS:     Code Status Orders  (From admission, onward)         Start     Ordered   09/05/19 2318  Full code  Continuous     09/05/19 2317  Code Status History    Date Active Date Inactive Code Status Order ID Comments User Context   05/24/2019 0134 05/26/2019 1628 Full Code [id number]  Mansy, Arvella Merles, MD ED   TOTAL TIME TAKING CARE OF THIS PATIENT: 40 minutes.    Henreitta Leber M.D on 09/07/2019 at 1:06 PM  Between 7am to 6pm - Pager - [phone]  After 6pm go to [email or web address] - Proofreader  [facility]  Office  [phone]  CC: Primary care physician; [name], MD

## 2019-09-07 NOTE — Progress Notes (Signed)
Post HD Assessment:    09/07/19 0001  Neurological  Level of Consciousness Alert  Orientation Level Oriented X4  Respiratory  Bilateral Breath Sounds Diminished  Cardiac  Pulse Regular  Heart Sounds S1, S2  Vascular  R Radial Pulse +3  L Radial Pulse +2  Integumentary  Integumentary (WDL) X  Skin Color Jaundice  Psychosocial  Psychosocial (WDL) WDL

## 2019-09-07 NOTE — Progress Notes (Signed)
Post HD Tx:   09/07/19 0000  Vital Signs  Temp 98.7 F (37.1 C)  Temp Source Oral  Pulse Rate 76  Pulse Rate Source Dinamap  Resp 17  BP (!) 164/62  BP Location Right Arm  BP Method Automatic  Patient Position (if appropriate) Lying  Oxygen Therapy  SpO2 100 %  O2 Device Nasal Cannula  O2 Flow Rate (L/min) 2 L/min  Pain Assessment  Pain Scale 0-10  Pain Score 0  Post-Hemodialysis Assessment  Rinseback Volume (mL) 250 mL  KECN 52.4 V  Dialyzer Clearance Lightly streaked  Duration of HD Treatment -hour(s) 2.45 hour(s)  Hemodialysis Intake (mL) 500 mL  UF Total -Machine (mL) 2000 mL  Net UF (mL) 1500 mL  Tolerated HD Treatment Yes  AVG/AVF Arterial Site Held (minutes)  (6)  AVG/AVF Venous Site Held (minutes)  (6)  Education / Care Plan  Dialysis Education Provided Yes  Documented Education in Care Plan Yes  Outpatient Plan of Care Reviewed and on Chart Yes

## 2019-09-07 NOTE — Progress Notes (Signed)
[facility]  ROUNDING NOTE   Subjective:  Patient did receive dialysis yesterday. Doing much better today. We will resume outpatient dialysis on Tuesday.   Objective:  Vital signs in last 24 hours:  Temp:  [98 F (36.7 C)-98.7 F (37.1 C)] 98.2 F (36.8 C) (10/18 1202) Pulse Rate:  [65-83] 79 (10/18 1202) Resp:  [14-20] 18 (10/18 1202) BP: (123-170)/(48-72) 123/48 (10/18 1202) SpO2:  [96 %-100 %] 96 % (10/18 1202) Weight:  [62.4 kg] 62.4 kg (10/18 0500)  Weight change: 2.979 kg Filed Weights   09/05/19 1648 09/05/19 2320 09/07/19 0500  Weight: 59.4 kg 60.1 kg 62.4 kg    Intake/Output: I/O last 3 completed shifts: In: 600 [P.O.:600] Out: 1500 [Other:1500]   Intake/Output this shift:  No intake/output data recorded.  Physical Exam: General: No acute distress  Head: Normocephalic, atraumatic. Moist oral mucosal membranes  Eyes: Anicteric  Neck: Supple, trachea midline  Lungs:  Clear to auscultation, normal effort  Heart: S1S2 no rubs  Abdomen:  Soft, nontender, bowel sounds present  Extremities: Trace peripheral edema.  Neurologic: Awake, alert, following commands  Skin: No lesions  Access: Left upper extremity AV fistula    Basic Metabolic Panel: Recent Labs  Lab 09/05/19 1651 09/06/19 0119  NA 141 138  K 3.9 4.4  CL 100 100  CO2 30 24  GLUCOSE 226* 218*  BUN 30* 37*  CREATININE 5.50* 5.91*  CALCIUM 8.2* 8.2*    Liver Function Tests: No results for input(s): AST, ALT, ALKPHOS, BILITOT, PROT, ALBUMIN in the last 168 hours. No results for input(s): LIPASE, AMYLASE in the last 168 hours. No results for input(s): AMMONIA in the last 168 hours.  CBC: Recent Labs  Lab 09/05/19 1651 09/06/19 0119  WBC 9.5 6.1  NEUTROABS 8.0*  --   HGB 8.5* 8.3*  HCT 26.3* 25.6*  MCV 92.0 91.4  PLT 137* 131*    Cardiac Enzymes: No results for input(s): CKTOTAL, CKMB, CKMBINDEX, TROPONINI in the last 168 hours.  BNP: Invalid input(s):  POCBNP  CBG: Recent Labs  Lab 09/06/19 0813 09/06/19 1207 09/06/19 1706 09/07/19 0730 09/07/19 1200  GLUCAP 166* 228* 141* 114* 168*    Microbiology: Results for orders placed or performed during the hospital encounter of 09/05/19  SARS CORONAVIRUS 2 (TAT 6-24 HRS) Nasopharyngeal Nasopharyngeal Swab     Status: None   Collection Time: 09/05/19  4:54 PM   Specimen: Nasopharyngeal Swab  Result Value Ref Range Status   SARS Coronavirus 2 NEGATIVE NEGATIVE Final    Comment: (NOTE) SARS-CoV-2 target nucleic acids are NOT DETECTED. The SARS-CoV-2 RNA is generally detectable in upper and lower respiratory specimens during the acute phase of infection. Negative results do not preclude SARS-CoV-2 infection, do not rule out co-infections with other pathogens, and should not be used as the sole basis for treatment or other patient management decisions. Negative results must be combined with clinical observations, patient history, and epidemiological information. The expected result is Negative. Fact Sheet for Patients: SugarRoll.be Fact Sheet for Healthcare Providers: https://www.woods-mathews.com/ This test is not yet approved or cleared by the [place] FDA and  has been authorized for detection and/or diagnosis of SARS-CoV-2 by FDA under an Emergency Use Authorization (EUA). This EUA will remain  in effect (meaning this test can be used) for the duration of the COVID-19 declaration under Section 56 4(b)(1) of the Act, 21 U.S.C. section 360bbb-3(b)(1), unless the authorization is terminated or revoked sooner. Performed at [facility], [place]  [address], [place], [place] [address]     Coagulation Studies: No results for input(s): LABPROT, INR in the last 72 hours.  Urinalysis: No results for input(s): COLORURINE, LABSPEC, PHURINE, GLUCOSEU, HGBUR, BILIRUBINUR, KETONESUR, PROTEINUR, UROBILINOGEN, NITRITE, LEUKOCYTESUR in the last  72 hours.  Invalid input(s): APPERANCEUR    Imaging: Dg Chest Port 1 View  Result Date: 09/05/2019 CLINICAL DATA:  Shortness of breath EXAM: PORTABLE CHEST 1 VIEW COMPARISON:  05/23/2019 FINDINGS: Tortuosity of the thoracic aorta with diffuse calcifications. Aorta is normal size. Low lung volumes with mild peribronchial thickening and interstitial prominence. No effusions. No acute bony abnormality. IMPRESSION: Mild peribronchial thickening and interstitial prominence may reflect bronchitic changes. Electronically Signed   By: Rolm Baptise M.D.   On: 09/05/2019 17:12     Medications:    . amLODipine  10 mg Oral Daily  . azithromycin  500 mg Oral Daily  . calcium acetate  667 mg Oral TID WC  . carvedilol  12.5 mg Oral BID WC  . clopidogrel  75 mg Oral Daily  . [START ON 09/09/2019] epoetin (EPOGEN/PROCRIT) injection  10,000 Units Intravenous Q T,Th,Sa-HD  . furosemide  80 mg Oral Daily  . heparin  5,000 Units Subcutaneous Q8H  . hydrALAZINE  100 mg Oral TID  . insulin aspart  0-5 Units Subcutaneous QHS  . insulin aspart  0-9 Units Subcutaneous TID WC  . loratadine  10 mg Oral Daily  . losartan  50 mg Oral Daily  . methylPREDNISolone (SOLU-MEDROL) injection  60 mg Intravenous Daily  . mometasone-formoterol  2 puff Inhalation BID  . oxybutynin  15 mg Oral Daily  . pantoprazole  40 mg Oral BID   acetaminophen **OR** acetaminophen, guaiFENesin-dextromethorphan, HYDROcodone-acetaminophen, Ipratropium-Albuterol, ondansetron **OR** ondansetron (ZOFRAN) IV, topiramate  Assessment/ Plan:  73 y.o. female with end stage renal disease on hemodialysis, hypertension, diabetes mellitus type II, overactive bladder, gout, COPD, congestive heart failure, coronary artery disease  [facility] TTS [address] 58.3kg Left AVF  1.  ESRD on HD TTS.  Patient has tolerated dialysis well while here.  No urgent indication for dialysis today.  We will plan for hemodialysis again on Tuesday as an  outpatient.  2.  Hypertension.  Blood pressure well controlled at 123/48.  Therefore we will continue amlodipine, carvedilol, hydralazine, and losartan for blood pressure control.  3.  Anemia of chronic kidney disease.  Continue Epogen as an outpatient.  4.  Secondary hyperparathyroidism.  We plan to monitor bone mineral metabolism parameters as an outpatient.   LOS: 2 Blayke Cordrey 10/18/20202:46 PM

## 2019-09-07 NOTE — Progress Notes (Signed)
Ennis Forts Haley to be D/C'd home with friend Vaughan Basta per MD order.  Discussed prescriptions and follow up appointments with the patient. Prescriptions given to patient, medication list explained in detail. Pt verbalized understanding.  Allergies as of 09/07/2019       Reactions   Enalapril Maleate Other (See Comments)   Other reaction(s): Headache   Nitrofurantoin Swelling, Rash   Other Reaction: swelling of body   Sulfamethoxazole-trimethoprim Swelling   2,4-d Dimethylamine (amisol) Rash, Other (See Comments)   Other Reaction: h/a   Baclofen Other (See Comments), Nausea Only   lightheadness ,drowsiness , muscle weakness , twitching in hands    Neosporin [neomycin-bacitracin Zn-polymyx] Other (See Comments), Rash   Other Reaction: irritation Skin irritation   Quinine Nausea And Vomiting, Rash, Other (See Comments)   Other Reaction: Vomiting, rash, h/a, vision   Ultram [tramadol] Palpitations   Zocor [simvastatin] Other (See Comments), Rash   Other Reaction: muscle spasms Muscle pain and spasms   Bactrim [sulfamethoxazole-trimethoprim] Swelling   Levodopa Other (See Comments)   Reaction: unknown   Macrodantin [nitrofurantoin Macrocrystal] Swelling   Quinine Derivatives Other (See Comments)   Vertigo,nausea vomiting blurred vision headache ears sensitivity        Medication List     TAKE these medications    albuterol 108 (90 Base) MCG/ACT inhaler Commonly known as: VENTOLIN HFA Inhale 2 puffs into the lungs every 6 (six) hours as needed for wheezing or shortness of breath.   albuterol (2.5 MG/3ML) 0.083% nebulizer solution Commonly known as: PROVENTIL Take 3 mLs (2.5 mg total) by nebulization every 6 (six) hours as needed for wheezing or shortness of breath.   amLODipine 10 MG tablet Commonly known as: NORVASC Take 10 mg by mouth daily.   azithromycin 250 MG tablet Commonly known as: Zithromax Take 1 tablet (250 mg total) by mouth daily.   calcium acetate 667 MG  capsule Commonly known as: PHOSLO Take 1,334 mg by mouth 3 (three) times daily with meals.   carvedilol 12.5 MG tablet Commonly known as: COREG Take 12.5 mg by mouth 2 (two) times daily with a meal.   cetirizine 10 MG tablet Commonly known as: ZYRTEC Take 10 mg by mouth daily as needed for allergies.   clopidogrel 75 MG tablet Commonly known as: PLAVIX Take 1 tablet (75 mg total) by mouth daily.   Fluticasone-Salmeterol 250-50 MCG/DOSE Aepb Commonly known as: ADVAIR Inhale 1 puff into the lungs 2 (two) times daily as needed (for shortness of breath).   furosemide 80 MG tablet Commonly known as: LASIX Take 1 tablet (80 mg total) by mouth daily.   hydrALAZINE 100 MG tablet Commonly known as: APRESOLINE Take 1 tablet (100 mg total) by mouth 3 (three) times daily.   HYDROcodone-acetaminophen 7.5-325 MG tablet Commonly known as: NORCO Take 1-2 tablets by mouth daily as needed (pain).   lidocaine-prilocaine cream Commonly known as: EMLA Apply 1 application topically as needed (prior to treatment).   losartan 50 MG tablet Commonly known as: COZAAR Take 50 mg by mouth daily.   omeprazole 20 MG capsule Commonly known as: PRILOSEC Take 1 capsule (20 mg total) by mouth 2 (two) times daily before a meal.   ondansetron 4 MG tablet Commonly known as: ZOFRAN Take 4 mg by mouth every 8 (eight) hours as needed.   oxybutynin 15 MG 24 hr tablet Commonly known as: DITROPAN XL Take 15 mg by mouth daily.   pravastatin 40 MG tablet Commonly known as: PRAVACHOL Take 40 mg by  mouth at bedtime.   predniSONE 10 MG tablet Commonly known as: DELTASONE Label  & dispense according to the schedule below. 5 Pills PO for 1 day then, 4 Pills PO for 1 day, 3 Pills PO for 1 day, 2 Pills PO for 1 day, 1 Pill PO for 1 days then STOP.   topiramate 25 MG tablet Commonly known as: TOPAMAX Take 1 tablet (25 mg total) by mouth as needed (for headaches).        Vitals:   09/07/19 0808  09/07/19 1202  BP: (!) 140/49 (!) 123/48  Pulse: 75 79  Resp:  18  Temp:  98.2 F (36.8 C)  SpO2:  96%    Skin clean, dry and intact without evidence of skin break down, no evidence of skin tears noted. IV catheter discontinued intact. Site without signs and symptoms of complications. Dressing and pressure applied. Pt denies pain at this time. No complaints noted.  An After Visit Summary was printed and given to the patient. Patient escorted via [place], and D/C home via private auto.  Phyllis [name]

## 2019-09-07 NOTE — Progress Notes (Signed)
HD Tx Completed:    09/06/19 2345  Vital Signs  Temp 98.7 F (37.1 C)  Temp Source Oral  Pulse Rate 78  Pulse Rate Source Dinamap  Resp 17  BP (!) 162/62  BP Location Right Arm  BP Method Automatic  Patient Position (if appropriate) Lying  Oxygen Therapy  SpO2 100 %  O2 Device Nasal Cannula  O2 Flow Rate (L/min) 2 L/min  Pain Assessment  Pain Scale 0-10  Pain Score 0  During Hemodialysis Assessment  Blood Flow Rate (mL/min) 330 mL/min  Arterial Pressure (mmHg) -190 mmHg  Venous Pressure (mmHg) 130 mmHg  Transmembrane Pressure (mmHg) 80 mmHg  Ultrafiltration Rate (mL/min) 720 mL/min  Dialysate Flow Rate (mL/min) 800 ml/min  Conductivity: Machine  13.9  HD Safety Checks Performed Yes  KECN 52.4 KECN  Dialysis Fluid Bolus Normal Saline  Bolus Amount (mL) 250 mL  Intra-Hemodialysis Comments Tx completed;Tolerated well

## 2019-09-15 ENCOUNTER — Other Ambulatory Visit: Payer: Self-pay | Admitting: Rheumatology

## 2019-09-15 DIAGNOSIS — R0789 Other chest pain: Secondary | ICD-10-CM

## 2019-09-19 ENCOUNTER — Ambulatory Visit
Admission: RE | Admit: 2019-09-19 | Discharge: 2019-09-19 | Disposition: A | Discharge status: Home / Self Care | Payer: Medicare Other | Source: Ambulatory Visit | Attending: Rheumatology | Admitting: Rheumatology

## 2019-09-19 ENCOUNTER — Other Ambulatory Visit: Payer: Self-pay

## 2019-09-19 DIAGNOSIS — R0789 Other chest pain: Secondary | ICD-10-CM | POA: Insufficient documentation

## 2019-09-20 ENCOUNTER — Inpatient Hospital Stay: Payer: Medicare Other

## 2019-09-20 ENCOUNTER — Emergency Department: Payer: Medicare Other

## 2019-09-20 ENCOUNTER — Encounter: Payer: Self-pay | Admitting: Emergency Medicine

## 2019-09-20 ENCOUNTER — Inpatient Hospital Stay
Admission: EM | Admit: 2019-09-20 | Discharge: 2019-09-30 | DRG: 291 | Disposition: A | Discharge status: Skilled Nursing Facility | Payer: Medicare Other | Attending: Family Medicine | Admitting: Family Medicine

## 2019-09-20 ENCOUNTER — Other Ambulatory Visit: Payer: Self-pay

## 2019-09-20 DIAGNOSIS — I5030 Unspecified diastolic (congestive) heart failure: Secondary | ICD-10-CM | POA: Diagnosis not present

## 2019-09-20 DIAGNOSIS — Z20828 Contact with and (suspected) exposure to other viral communicable diseases: Secondary | ICD-10-CM | POA: Diagnosis present

## 2019-09-20 DIAGNOSIS — R0902 Hypoxemia: Secondary | ICD-10-CM | POA: Diagnosis not present

## 2019-09-20 DIAGNOSIS — K219 Gastro-esophageal reflux disease without esophagitis: Secondary | ICD-10-CM | POA: Diagnosis present

## 2019-09-20 DIAGNOSIS — E1122 Type 2 diabetes mellitus with diabetic chronic kidney disease: Secondary | ICD-10-CM | POA: Diagnosis present

## 2019-09-20 DIAGNOSIS — Z4659 Encounter for fitting and adjustment of other gastrointestinal appliance and device: Secondary | ICD-10-CM

## 2019-09-20 DIAGNOSIS — E785 Hyperlipidemia, unspecified: Secondary | ICD-10-CM | POA: Diagnosis present

## 2019-09-20 DIAGNOSIS — J440 Chronic obstructive pulmonary disease with acute lower respiratory infection: Secondary | ICD-10-CM | POA: Diagnosis present

## 2019-09-20 DIAGNOSIS — I214 Non-ST elevation (NSTEMI) myocardial infarction: Secondary | ICD-10-CM | POA: Diagnosis not present

## 2019-09-20 DIAGNOSIS — E872 Acidosis: Secondary | ICD-10-CM | POA: Diagnosis present

## 2019-09-20 DIAGNOSIS — I38 Endocarditis, valve unspecified: Secondary | ICD-10-CM | POA: Diagnosis not present

## 2019-09-20 DIAGNOSIS — R0603 Acute respiratory distress: Secondary | ICD-10-CM | POA: Diagnosis present

## 2019-09-20 DIAGNOSIS — J441 Chronic obstructive pulmonary disease with (acute) exacerbation: Secondary | ICD-10-CM

## 2019-09-20 DIAGNOSIS — J9690 Respiratory failure, unspecified, unspecified whether with hypoxia or hypercapnia: Secondary | ICD-10-CM

## 2019-09-20 DIAGNOSIS — I25110 Atherosclerotic heart disease of native coronary artery with unstable angina pectoris: Secondary | ICD-10-CM | POA: Diagnosis present

## 2019-09-20 DIAGNOSIS — D62 Acute posthemorrhagic anemia: Secondary | ICD-10-CM | POA: Diagnosis present

## 2019-09-20 DIAGNOSIS — I700 Atherosclerosis of aorta: Secondary | ICD-10-CM | POA: Diagnosis present

## 2019-09-20 DIAGNOSIS — D631 Anemia in chronic kidney disease: Secondary | ICD-10-CM | POA: Diagnosis present

## 2019-09-20 DIAGNOSIS — M109 Gout, unspecified: Secondary | ICD-10-CM | POA: Diagnosis present

## 2019-09-20 DIAGNOSIS — I350 Nonrheumatic aortic (valve) stenosis: Secondary | ICD-10-CM | POA: Diagnosis present

## 2019-09-20 DIAGNOSIS — A419 Sepsis, unspecified organism: Secondary | ICD-10-CM | POA: Diagnosis not present

## 2019-09-20 DIAGNOSIS — N3281 Overactive bladder: Secondary | ICD-10-CM | POA: Diagnosis present

## 2019-09-20 DIAGNOSIS — R7989 Other specified abnormal findings of blood chemistry: Secondary | ICD-10-CM | POA: Diagnosis not present

## 2019-09-20 DIAGNOSIS — N2581 Secondary hyperparathyroidism of renal origin: Secondary | ICD-10-CM | POA: Diagnosis present

## 2019-09-20 DIAGNOSIS — I5033 Acute on chronic diastolic (congestive) heart failure: Secondary | ICD-10-CM

## 2019-09-20 DIAGNOSIS — I060 Rheumatic aortic stenosis: Secondary | ICD-10-CM | POA: Diagnosis not present

## 2019-09-20 DIAGNOSIS — N186 End stage renal disease: Secondary | ICD-10-CM

## 2019-09-20 DIAGNOSIS — J810 Acute pulmonary edema: Secondary | ICD-10-CM | POA: Diagnosis not present

## 2019-09-20 DIAGNOSIS — E669 Obesity, unspecified: Secondary | ICD-10-CM | POA: Diagnosis present

## 2019-09-20 DIAGNOSIS — R918 Other nonspecific abnormal finding of lung field: Secondary | ICD-10-CM

## 2019-09-20 DIAGNOSIS — Z8249 Family history of ischemic heart disease and other diseases of the circulatory system: Secondary | ICD-10-CM | POA: Diagnosis not present

## 2019-09-20 DIAGNOSIS — J9621 Acute and chronic respiratory failure with hypoxia: Secondary | ICD-10-CM | POA: Diagnosis present

## 2019-09-20 DIAGNOSIS — I132 Hypertensive heart and chronic kidney disease with heart failure and with stage 5 chronic kidney disease, or end stage renal disease: Principal | ICD-10-CM | POA: Diagnosis present

## 2019-09-20 DIAGNOSIS — Z992 Dependence on renal dialysis: Secondary | ICD-10-CM | POA: Diagnosis not present

## 2019-09-20 DIAGNOSIS — J9601 Acute respiratory failure with hypoxia: Secondary | ICD-10-CM | POA: Diagnosis not present

## 2019-09-20 DIAGNOSIS — I10 Essential (primary) hypertension: Secondary | ICD-10-CM | POA: Diagnosis not present

## 2019-09-20 DIAGNOSIS — J984 Other disorders of lung: Secondary | ICD-10-CM

## 2019-09-20 DIAGNOSIS — E1151 Type 2 diabetes mellitus with diabetic peripheral angiopathy without gangrene: Secondary | ICD-10-CM | POA: Diagnosis present

## 2019-09-20 DIAGNOSIS — Z7902 Long term (current) use of antithrombotics/antiplatelets: Secondary | ICD-10-CM | POA: Diagnosis not present

## 2019-09-20 DIAGNOSIS — J189 Pneumonia, unspecified organism: Secondary | ICD-10-CM

## 2019-09-20 DIAGNOSIS — I509 Heart failure, unspecified: Secondary | ICD-10-CM | POA: Diagnosis not present

## 2019-09-20 DIAGNOSIS — I5031 Acute diastolic (congestive) heart failure: Secondary | ICD-10-CM | POA: Diagnosis not present

## 2019-09-20 DIAGNOSIS — Z79899 Other long term (current) drug therapy: Secondary | ICD-10-CM

## 2019-09-20 DIAGNOSIS — G4733 Obstructive sleep apnea (adult) (pediatric): Secondary | ICD-10-CM | POA: Diagnosis present

## 2019-09-20 DIAGNOSIS — R6521 Severe sepsis with septic shock: Secondary | ICD-10-CM | POA: Diagnosis not present

## 2019-09-20 LAB — CBC WITH DIFFERENTIAL/PLATELET
Abs Immature Granulocytes: 0.14 10*3/uL — ABNORMAL HIGH (ref 0.00–0.07)
Basophils Absolute: 0 10*3/uL (ref 0.0–0.1)
Basophils Relative: 0 %
Eosinophils Absolute: 0.4 10*3/uL (ref 0.0–0.5)
Eosinophils Relative: 3 %
HCT: 24.5 % — ABNORMAL LOW (ref 36.0–46.0)
Hemoglobin: 7.9 g/dL — ABNORMAL LOW (ref 12.0–15.0)
Immature Granulocytes: 1 %
Lymphocytes Relative: 9 %
Lymphs Abs: 1.4 10*3/uL (ref 0.7–4.0)
MCH: 29.8 pg (ref 26.0–34.0)
MCHC: 32.2 g/dL (ref 30.0–36.0)
MCV: 92.5 fL (ref 80.0–100.0)
Monocytes Absolute: 0.8 10*3/uL (ref 0.1–1.0)
Monocytes Relative: 5 %
Neutro Abs: 12.3 10*3/uL — ABNORMAL HIGH (ref 1.7–7.7)
Neutrophils Relative %: 82 %
Platelets: 206 10*3/uL (ref 150–400)
RBC: 2.65 MIL/uL — ABNORMAL LOW (ref 3.87–5.11)
RDW: 14.2 % (ref 11.5–15.5)
WBC: 15.1 10*3/uL — ABNORMAL HIGH (ref 4.0–10.5)
nRBC: 0 % (ref 0.0–0.2)

## 2019-09-20 LAB — BLOOD GAS, VENOUS
Acid-base deficit: 1.1 mmol/L (ref 0.0–2.0)
Bicarbonate: 25.3 mmol/L (ref 20.0–28.0)
Delivery systems: POSITIVE
O2 Saturation: 91.2 %
Patient temperature: 37
pCO2, Ven: 48 mmHg (ref 44.0–60.0)
pH, Ven: 7.33 (ref 7.250–7.430)
pO2, Ven: 66 mmHg — ABNORMAL HIGH (ref 32.0–45.0)

## 2019-09-20 LAB — TROPONIN I (HIGH SENSITIVITY)
Troponin I (High Sensitivity): 41 ng/L — ABNORMAL HIGH (ref <18)
Troponin I (High Sensitivity): 32 ng/L — ABNORMAL HIGH (ref <18)

## 2019-09-20 LAB — COMPREHENSIVE METABOLIC PANEL
ALT: 11 U/L (ref 0–44)
AST: 16 U/L (ref 15–41)
Albumin: 3.9 g/dL (ref 3.5–5.0)
Alkaline Phosphatase: 68 U/L (ref 38–126)
Anion gap: 14 (ref 5–15)
BUN: 49 mg/dL — ABNORMAL HIGH (ref 8–23)
CO2: 25 mmol/L (ref 22–32)
Calcium: 7.8 mg/dL — ABNORMAL LOW (ref 8.9–10.3)
Chloride: 99 mmol/L (ref 98–111)
Creatinine, Ser: 6.54 mg/dL — ABNORMAL HIGH (ref 0.44–1.00)
GFR calc Af Amer: 7 mL/min — ABNORMAL LOW (ref >60)
GFR calc non Af Amer: 6 mL/min — ABNORMAL LOW (ref >60)
Glucose, Bld: 190 mg/dL — ABNORMAL HIGH (ref 70–99)
Potassium: 3.7 mmol/L (ref 3.5–5.1)
Sodium: 138 mmol/L (ref 135–145)
Total Bilirubin: 0.7 mg/dL (ref 0.3–1.2)
Total Protein: 6.8 g/dL (ref 6.5–8.1)

## 2019-09-20 LAB — LACTIC ACID, PLASMA
Lactic Acid, Venous: 2.1 mmol/L (ref 0.5–1.9)
Lactic Acid, Venous: 0.7 mmol/L (ref 0.5–1.9)

## 2019-09-20 LAB — SARS CORONAVIRUS 2 BY RT PCR (HOSPITAL ORDER, PERFORMED IN ~~LOC~~ HOSPITAL LAB): SARS Coronavirus 2: NEGATIVE

## 2019-09-20 LAB — BRAIN NATRIURETIC PEPTIDE: B Natriuretic Peptide: 1516 pg/mL — ABNORMAL HIGH (ref 0.0–100.0)

## 2019-09-20 LAB — INFLUENZA PANEL BY PCR (TYPE A & B)
Influenza A By PCR: NEGATIVE
Influenza B By PCR: NEGATIVE

## 2019-09-20 LAB — GLUCOSE, CAPILLARY
Glucose-Capillary: 149 mg/dL — ABNORMAL HIGH (ref 70–99)
Glucose-Capillary: 151 mg/dL — ABNORMAL HIGH (ref 70–99)
Glucose-Capillary: 114 mg/dL — ABNORMAL HIGH (ref 70–99)
Glucose-Capillary: 109 mg/dL — ABNORMAL HIGH (ref 70–99)

## 2019-09-20 LAB — STREP PNEUMONIAE URINARY ANTIGEN: Strep Pneumo Urinary Antigen: NEGATIVE

## 2019-09-20 LAB — MRSA PCR SCREENING: MRSA by PCR: NEGATIVE

## 2019-09-20 MED ORDER — SODIUM CHLORIDE 0.9 % IV BOLUS (SEPSIS)
1000.0000 mL | Freq: Once | INTRAVENOUS | Status: AC
  Administered 2019-09-20: 1000 mL via INTRAVENOUS

## 2019-09-20 MED ORDER — ALBUTEROL SULFATE (2.5 MG/3ML) 0.083% IN NEBU
2.5000 mg | INHALATION_SOLUTION | Freq: Four times a day (QID) | RESPIRATORY_TRACT | Status: DC
  Administered 2019-09-20 – 2019-09-21 (×4): 2.5 mg via RESPIRATORY_TRACT
  Filled 2019-09-20 (×4): qty 3

## 2019-09-20 MED ORDER — CHLORHEXIDINE GLUCONATE 0.12% ORAL RINSE (MEDLINE KIT)
15.0000 mL | Freq: Two times a day (BID) | OROMUCOSAL | Status: DC
  Administered 2019-09-20 – 2019-09-21 (×3): 15 mL via OROMUCOSAL

## 2019-09-20 MED ORDER — METHYLPREDNISOLONE SODIUM SUCC 125 MG IJ SOLR
INTRAMUSCULAR | Status: AC
  Filled 2019-09-20: qty 2

## 2019-09-20 MED ORDER — ENOXAPARIN SODIUM 40 MG/0.4ML ~~LOC~~ SOLN: 40.0000 mg | SUBCUTANEOUS | Status: DC

## 2019-09-20 MED ORDER — ORAL CARE MOUTH RINSE
15.0000 mL | OROMUCOSAL | Status: DC
  Administered 2019-09-20 – 2019-09-21 (×10): 15 mL via OROMUCOSAL

## 2019-09-20 MED ORDER — ETOMIDATE 2 MG/ML IV SOLN
INTRAVENOUS | Status: AC | PRN
  Administered 2019-09-20: 20 mg via INTRAVENOUS

## 2019-09-20 MED ORDER — SODIUM CHLORIDE 0.9 % IV SOLN
2.0000 g | INTRAVENOUS | Status: DC
  Filled 2019-09-20: qty 20

## 2019-09-20 MED ORDER — PANTOPRAZOLE SODIUM 40 MG PO PACK
40.0000 mg | PACK | Freq: Every day | ORAL | Status: DC
  Administered 2019-09-20: 40 mg
  Filled 2019-09-20: qty 20

## 2019-09-20 MED ORDER — SUCCINYLCHOLINE CHLORIDE 20 MG/ML IJ SOLN
INTRAMUSCULAR | Status: AC | PRN
  Administered 2019-09-20: 200 mg via INTRAVENOUS

## 2019-09-20 MED ORDER — PROPOFOL 1000 MG/100ML IV EMUL
INTRAVENOUS | Status: AC
  Administered 2019-09-20: 20 ug/kg/min via INTRAVENOUS
  Filled 2019-09-20: qty 100

## 2019-09-20 MED ORDER — HEPARIN SODIUM (PORCINE) 5000 UNIT/ML IJ SOLN
5000.0000 [IU] | Freq: Three times a day (TID) | INTRAMUSCULAR | Status: DC
  Administered 2019-09-20 – 2019-09-21 (×3): 5000 [IU] via SUBCUTANEOUS
  Filled 2019-09-20 (×3): qty 1

## 2019-09-20 MED ORDER — SODIUM CHLORIDE 0.9 % IV SOLN
2.0000 g | INTRAVENOUS | Status: DC
  Administered 2019-09-20: 2 g via INTRAVENOUS
  Filled 2019-09-20: qty 20

## 2019-09-20 MED ORDER — SODIUM CHLORIDE 0.9 % IV BOLUS (SEPSIS)
1000.0000 mL | Freq: Once | INTRAVENOUS | Status: AC
  Administered 2019-09-20: 07:00:00 1000 mL via INTRAVENOUS

## 2019-09-20 MED ORDER — PROPOFOL 1000 MG/100ML IV EMUL: 5.0000 ug/kg/min | INTRAVENOUS | Status: DC

## 2019-09-20 MED ORDER — MAGNESIUM SULFATE 2 GM/50ML IV SOLN
2.0000 g | Freq: Once | INTRAVENOUS | Status: AC
  Administered 2019-09-20: 2 g via INTRAVENOUS
  Administered 2019-09-20: 14:00:00 via INTRAVENOUS
  Filled 2019-09-20: qty 50

## 2019-09-20 MED ORDER — PROPOFOL 1000 MG/100ML IV EMUL
5.0000 ug/kg/min | INTRAVENOUS | Status: DC
  Administered 2019-09-20: 50 ug/kg/min via INTRAVENOUS
  Administered 2019-09-20: 60 ug/kg/min via INTRAVENOUS
  Administered 2019-09-20: 50 ug/kg/min via INTRAVENOUS
  Administered 2019-09-20 – 2019-09-21 (×4): 60 ug/kg/min via INTRAVENOUS
  Filled 2019-09-20 (×5): qty 100

## 2019-09-20 MED ORDER — MAGNESIUM SULFATE 2 GM/50ML IV SOLN
INTRAVENOUS | Status: AC
  Administered 2019-09-20: 14:00:00 via INTRAVENOUS
  Filled 2019-09-20: qty 50

## 2019-09-20 MED ORDER — SODIUM CHLORIDE 0.9 % IV SOLN
500.0000 mg | INTRAVENOUS | Status: DC
  Administered 2019-09-20: 500 mg via INTRAVENOUS
  Filled 2019-09-20: qty 500

## 2019-09-20 MED ORDER — METHYLPREDNISOLONE SODIUM SUCC 125 MG IJ SOLR
125.0000 mg | Freq: Once | INTRAMUSCULAR | Status: AC
  Administered 2019-09-20: 125 mg via INTRAVENOUS

## 2019-09-20 MED ORDER — CHLORHEXIDINE GLUCONATE CLOTH 2 % EX PADS
6.0000 | MEDICATED_PAD | Freq: Every day | CUTANEOUS | Status: DC
  Administered 2019-09-20 – 2019-09-30 (×9): 6 via TOPICAL

## 2019-09-20 MED ORDER — PROPOFOL 1000 MG/100ML IV EMUL
5.0000 ug/kg/min | INTRAVENOUS | Status: DC
  Administered 2019-09-20 (×4): 20 ug/kg/min via INTRAVENOUS
  Administered 2019-09-20: 5 ug/kg/min via INTRAVENOUS

## 2019-09-20 MED ORDER — INSULIN ASPART 100 UNIT/ML ~~LOC~~ SOLN
0.0000 [IU] | SUBCUTANEOUS | Status: DC
  Administered 2019-09-20: 2 [IU] via SUBCUTANEOUS
  Administered 2019-09-21: 1 [IU] via SUBCUTANEOUS
  Filled 2019-09-20 (×2): qty 1

## 2019-09-20 MED ORDER — SODIUM CHLORIDE 0.9 % IV SOLN
500.0000 mg | INTRAVENOUS | Status: DC
  Filled 2019-09-20: qty 500

## 2019-09-20 MED ORDER — EPOETIN ALFA 4000 UNIT/ML IJ SOLN
4000.0000 [IU] | INTRAMUSCULAR | Status: DC
  Administered 2019-09-20 – 2019-09-30 (×5): 4000 [IU] via INTRAVENOUS
  Filled 2019-09-20 (×5): qty 1

## 2019-09-20 MED ORDER — IPRATROPIUM-ALBUTEROL 0.5-2.5 (3) MG/3ML IN SOLN
3.0000 mL | Freq: Once | RESPIRATORY_TRACT | Status: AC
  Administered 2019-09-20: 3 mL via RESPIRATORY_TRACT

## 2019-09-20 MED ORDER — MIDAZOLAM HCL 2 MG/2ML IJ SOLN
2.0000 mg | INTRAMUSCULAR | Status: DC | PRN
  Administered 2019-09-20: 2 mg via INTRAVENOUS
  Filled 2019-09-20: qty 2

## 2019-09-20 NOTE — ED Triage Notes (Signed)
Pt arrives via [facility] with c/o respiratory distress. Pt was found by fire department at 60% RA. Per EMS, pt was administered x 1 duoneb. Pt is experiencing labored breathing at this time and has hx of CHF and COPD.

## 2019-09-20 NOTE — Progress Notes (Signed)
Anticoag Monitoring: Patient is a 73yo female with h/o ESRD on HD. Patient ordered Lovenox 40mg  SQ q24h for DVT prevention.  Will discontinue Lovenox and order Heparin 5000 units SQ q8h per protocol.  Paulina Fusi, PharmD, BCPS 09/20/2019 10:02 AM

## 2019-09-20 NOTE — Progress Notes (Signed)
Pre HD Tx Note  Pt is planned to have HD Txin ICU. Pt is currently intubated, receiving continuous  Propofol. BP and vitals are WDL to receive HD Tx. Unable to have consent due to pt status. 3K2.5Ca for 3.5 hrs. Access is WDL. Will keep monitoring  09/20/19 1510  Hand-Off documentation  Report given to (Full Name) Newt Minion RN  Report received from (Full Name) Shannonn RN  Vital Signs  Temp 97.9 F (36.6 C)  Pulse Rate 75  Pulse Rate Source Monitor  Resp 13  BP (!) 143/57  BP Location Right Arm  BP Method Automatic  Patient Position (if appropriate) Lying  Oxygen Therapy  SpO2 100 %  O2 Device Ventilator  Pain Assessment  Pain Scale Faces (intubated)  Pain Score Asleep (intubated)  Critical Care Pain Observation Tool (CPOT)  Facial Expression 0  Dialysis Weight  Weight 63.9 kg  Type of Weight Pre-Dialysis  Time-Out for Hemodialysis  What Procedure? HD  Pt Identifiers(min of two) First/Last Name;MRN/Account#  Correct Site? Yes  Correct Side? Yes  Correct Procedure? Yes  Consents Verified? No (Pt is ICU /Intibated)  Safety Precautions Reviewed? Yes  Engineer, civil (consulting) Number 3  Station Number  [id number])  UF/Alarm Test Passed  Conductivity: Meter 13.7  Conductivity: Machine  14  pH 7.4  Reverse Osmosis  (RO 3)  Normal Saline Lot Number [id number]  Dialyzer Lot Number [id number]  Disposable Set Lot Number [id number]  Machine Temperature 98.6 F (37 C)  Musician and Audible Yes  Blood Lines Intact and Secured Yes  Pre Treatment Patient Checks  Vascular access used during treatment Fistula  HD catheter dressing before treatment WDL  Patient is receiving dialysis in a chair  (in icu )  Hepatitis B Surface Antigen Results Negative  Date Hepatitis B Surface Antigen Drawn 09/06/19  Hepatitis B Surface Antibody  (>10)  Date Hepatitis B Surface Antibody Drawn 11/29/18  Hemodialysis Consent Verified Other (Comment) (Pt is intubated)  Hemodialysis Standing  Orders Initiated Yes  ECG (Telemetry) Monitor On Yes  Prime Ordered Normal Saline  Length of  DialysisTreatment -hour(s) 3.5 Hour(s)  Dialysis Treatment Comments  (Na140)  Dialyzer Elisio 17H NR  Dialysate 3K;2.5 Ca  Dialysis Anticoagulant None  Dialysate Flow Ordered 600  Blood Flow Rate Ordered 400 mL/min  Ultrafiltration Goal 1.5 Liters  Dialysis Blood Pressure Support Ordered Albumin  Education / Care Plan  Dialysis Education Provided  (Pt is intubated)  Documented Education in Care Plan Yes  Fistula / Graft Left Forearm Arteriovenous fistula  No Placement Date or Time found.   Placed prior to admission: Yes  Orientation: Left  Access Location: Forearm  Access Type: Arteriovenous fistula  Site Condition No complications  Fistula / Graft Assessment Present;Absent ;Thrill  Status Accessed  Needle Size 15  Drainage Description None

## 2019-09-20 NOTE — ED Provider Notes (Signed)
Ultrasound ED Peripheral IV (Provider)  Date/Time: 09/20/2019 7:19 AM Performed by: Duffy Bruce, MD Authorized by: Duffy Bruce, MD   Procedure details:    Indications: multiple failed IV attempts     Skin Prep: chlorhexidine gluconate     Location:  Right forearm   Angiocath:  20 G   Bedside Ultrasound Guided: Yes     Images: archived     Patient tolerated procedure without complications: Yes     Dressing applied: Yes        Duffy Bruce, MD 09/20/19 0730

## 2019-09-20 NOTE — Progress Notes (Signed)
[facility], [place] 09/20/19  Subjective:   LOS: 0 No intake/output data recorded.  Patient known to our practice from outpatient dialysis.  She arrives via EMS for complaints of respiratory distress.  She had low saturation of 60% on room air was administered DuoNeb by EMS.  Patient became fatigued.  She was intubated for respiratory distress.  She will not be monitored in the ICU.  Urgent hemodialysis requested by emergency room team  Last HD was on 10/29.  Patient completed her treatment and achieved dry weight  CXR shows b/l patch infiltrate - pneumonia vs Pulm edema Patient is currently intubated and sedated with propofol   Objective:  Vital signs in last 24 hours:  Pulse Rate:  [78-100] 81 (10/31 0815) Resp:  [16-43] 25 (10/31 0815) BP: (124-195)/(44-79) 134/54 (10/31 0815) SpO2:  [90 %-100 %] 100 % (10/31 0815) FiO2 (%):  [60 %] 60 % (10/31 0555) Weight:  [61.2 kg] 61.2 kg (10/31 0524)  Weight change:  Filed Weights   09/20/19 0524  Weight: 61.2 kg    Intake/Output:    Intake/Output Summary (Last 24 hours) at 09/20/2019 [id number] Last data filed at 09/20/2019 0807 Gross per 24 hour  Intake 100 ml  Output -  Net 100 ml     Physical Exam: General: Critically ill appearing  HEENT Anicteric, ETT  Pulm/lungs Coarse crackles b/l, vent assisted  CVS/Heart Regular tachycardic  Abdomen:  Soft, non distended  Extremities: No edema  Neurologic: sedated  Skin: No acute rashes, warm dry  Access: Left forearm AVF       Basic Metabolic Panel:  Recent Labs  Lab 09/20/19 0526  NA 138  K 3.7  CL 99  CO2 25  GLUCOSE 190*  BUN 49*  CREATININE 6.54*  CALCIUM 7.8*     CBC: Recent Labs  Lab 09/20/19 0526  WBC 15.1*  NEUTROABS 12.3*  HGB 7.9*  HCT 24.5*  MCV 92.5  PLT 206      Lab Results  Component Value Date   HEPBSAG NON REACTIVE 09/06/2019      Microbiology:  Recent Results (from the past 240 hour(s))  SARS  Coronavirus 2 by RT PCR (hospital order, performed in [facility] hospital lab) Nasopharyngeal Nasopharyngeal Swab     Status: None   Collection Time: 09/20/19  5:26 AM   Specimen: Nasopharyngeal Swab  Result Value Ref Range Status   SARS Coronavirus 2 NEGATIVE NEGATIVE Final    Comment: (NOTE) If result is NEGATIVE SARS-CoV-2 target nucleic acids are NOT DETECTED. The SARS-CoV-2 RNA is generally detectable in upper and lower  respiratory specimens during the acute phase of infection. The lowest  concentration of SARS-CoV-2 viral copies this assay can detect is 250  copies / mL. A negative result does not preclude SARS-CoV-2 infection  and should not be used as the sole basis for treatment or other  patient management decisions.  A negative result may occur with  improper specimen collection / handling, submission of specimen other  than nasopharyngeal swab, presence of viral mutation(s) within the  areas targeted by this assay, and inadequate number of viral copies  (<250 copies / mL). A negative result must be combined with clinical  observations, patient history, and epidemiological information. If result is POSITIVE SARS-CoV-2 target nucleic acids are DETECTED. The SARS-CoV-2 RNA is generally detectable in upper and lower  respiratory specimens dur ing the acute phase of infection.  Positive  results are indicative of active infection with SARS-CoV-2.  Clinical  correlation with patient history and other diagnostic information is  necessary to determine patient infection status.  Positive results do  not rule out bacterial infection or co-infection with other viruses. If result is PRESUMPTIVE POSTIVE SARS-CoV-2 nucleic acids MAY BE PRESENT.   A presumptive positive result was obtained on the submitted specimen  and confirmed on repeat testing.  While 2019 novel coronavirus  (SARS-CoV-2) nucleic acids may be present in the submitted sample  additional confirmatory testing may be  necessary for epidemiological  and / or clinical management purposes  to differentiate between  SARS-CoV-2 and other Sarbecovirus currently known to infect humans.  If clinically indicated additional testing with an alternate test  methodology [id number]) is advised. The SARS-CoV-2 RNA is generally  detectable in upper and lower respiratory sp ecimens during the acute  phase of infection. The expected result is Negative. Fact Sheet for Patients:  StrictlyIdeas.no Fact Sheet for Healthcare Providers: BankingDealers.co.za This test is not yet approved or cleared by the [place] FDA and has been authorized for detection and/or diagnosis of SARS-CoV-2 by FDA under an Emergency Use Authorization (EUA).  This EUA will remain in effect (meaning this test can be used) for the duration of the COVID-19 declaration under Section 564(b)(1) of the Act, 21 U.S.C. section 360bbb-3(b)(1), unless the authorization is terminated or revoked sooner. Performed at [facility], [address] [place], [place] [address]     Coagulation Studies: No results for input(s): LABPROT, INR in the last 72 hours.  Urinalysis: No results for input(s): COLORURINE, LABSPEC, PHURINE, GLUCOSEU, HGBUR, BILIRUBINUR, KETONESUR, PROTEINUR, UROBILINOGEN, NITRITE, LEUKOCYTESUR in the last 72 hours.  Invalid input(s): APPERANCEUR    Imaging: Ct Chest Wo Contrast  Result Date: 09/19/2019 CLINICAL DATA:  Midsternal chest pain for 2 weeks with shortness of breath. Nonsmoker without history of cancer. EXAM: CT CHEST WITHOUT CONTRAST TECHNIQUE: Multidetector CT imaging of the chest was performed following the standard protocol without IV contrast. COMPARISON:  09/05/2019 chest radiograph. Most recent CT of 09/02/2008 FINDINGS: Cardiovascular: Advanced aortic and branch vessel atherosclerosis. Tortuous thoracic aorta. Mild cardiomegaly, without pericardial effusion.  Multivessel coronary artery atherosclerosis. Aortic valve calcification. Mediastinum/Nodes: Hypoattenuating left thyroid nodule of 1.2 cm, new but nonspecific. No mediastinal or definite hilar adenopathy, given limitations of unenhanced CT. Lungs/Pleura: No pleural fluid.  Clear lungs. Upper Abdomen: Nonspecific caudate lobe enlargement. Similar. Normal imaged portions of the spleen, adrenal glands. Renal atrophy bilaterally. An upper pole left renal exophytic 1.8 cm cyst. Pancreatic tail 1.8 cm lesion on 114/2 is minimally enlarged compared to 2009 where it measured 1.5 cm. Calcification in its inferior portion. There is also a incompletely imaged pancreatic body lesion on 125/2, on the order of 1.8 cm-similar. Suggestion of peripancreatic inflammation, especially about the pancreatic tail lesion on 113/2. Cholecystectomy. Musculoskeletal: Lower thoracic spondylosis. No sternal fracture or retrosternal fluid. IMPRESSION: 1.  No acute process in the chest. 2. Coronary artery atherosclerosis. Aortic Atherosclerosis (ICD10-I70.0). 3. Pancreatic cystic lesions, suboptimally and incompletely evaluated. Suspicion of peripancreatic edema. Correlate with symptoms to suggest acute on chronic pancreatitis. Consider follow-up with dedicated MRI or CT. 4. Chronic caudate lobe prominence, nonspecific. Correlate with any risk factors for cirrhosis. Electronically Signed   By: Abigail Miyamoto M.D.   On: 09/19/2019 12:28   Dg Chest Port 1 View  Result Date: 09/20/2019 CLINICAL DATA:  Intubated. Respiratory distress. EXAM: PORTABLE CHEST 1 VIEW COMPARISON:  09/05/2019 FINDINGS: The endotracheal tube tip is at the thoracic inlet. Borderline enlarged cardiac silhouette.  Tortuous and calcified thoracic aorta. Extensive patchy bilateral airspace opacity, greater on the right. No pleural fluid. Thoracic spine degenerative changes. IMPRESSION: 1. Endotracheal tube tip at the thoracic inlet. It is recommended that this be advanced 4.3  cm. 2. Extensive patchy bilateral probable pneumonia. 3. Aortic atherosclerosis. Electronically Signed   By: Claudie Revering M.D.   On: 09/20/2019 06:41     Medications:   . azithromycin 500 mg (09/20/19 [id number])  . cefTRIAXone (ROCEPHIN)  IV Stopped (09/20/19 [id number])      Assessment/ Plan:  73 y.o. female with end stage renal disease on hemodialysis, hypertension, diabetes mellitus type II, overactive bladder, gout, COPD, congestive heart failure, coronary artery disease, aortic atherosclearosis  Active Problems:   * No active hospital problems. *  [facility]// [facility] //TuThSa-2// TW 60kg  #. ESRD with acute resp failure - Acute pulm edema vs pneumonia - will arange for urgent HD today - UF goal 1.5-2 kg as tolerated - no family available.  Friend Betsy Brewer line is busy.  Not picking up the phone. -Discussed with Dr. Manuella Ghazi and patient's nurse, will place dialysis orders as emergent.  No consent available.   #. Anemia of CKD  Lab Results  Component Value Date   HGB 7.9 (L) 09/20/2019  low dose epo with HD   #. SHPTH     Component Value Date/Time   PTH 160 (H) 05/24/2019 0413   Lab Results  Component Value Date   PHOS 3.9 05/24/2019  monitor phos during this admission       LOS: [place] 10/31/20208:22 [place], [place]

## 2019-09-20 NOTE — Progress Notes (Signed)
Post HD Tx  Pt tolerated well the HD. UF goal as PO was 1.5 to 2.L Pt tolerated  The removal of [id number] without complications. Pt continous to be intubated and continues to receive Propofol. Pt Tx run for 3.5 hrs 3K2.5Ca and received her 4000K Epo as PO. AVF is de-accessed with no complications. Atrial access was bleeding for more than 10 min. Report is givern to primary RN   09/20/19 1915  Hand-Off documentation  Report given to (Full Name) Skeet Latch RN   Report received from (Full Name) Newt Minion RN   Vital Signs  Temp 99.5 F (37.5 C)  Pulse Rate 81  Resp 20  BP (!) 147/49  BP Location Right Arm  BP Method Automatic  Patient Position (if appropriate) Lying  Oxygen Therapy  SpO2 100 %  O2 Device Ventilator  FiO2 (%) 30 %  Post-Hemodialysis Assessment  Rinseback Volume (mL) 250 mL  KECN 72.6 V  Dialyzer Clearance Lightly streaked  Duration of HD Treatment -hour(s) 3.5 hour(s)  Hemodialysis Intake (mL) 500 mL  UF Total -Machine (mL) 2300 mL  Net UF (mL) 1800 mL  Tolerated HD Treatment Yes  AVG/AVF Arterial Site Held (minutes) 8 minutes  AVG/AVF Venous Site Held (minutes) 13 minutes  Fistula / Graft Left Forearm Arteriovenous fistula  No Placement Date or Time found.   Placed prior to admission: Yes  Orientation: Left  Access Location: Forearm  Access Type: Arteriovenous fistula  Site Condition No complications  Fistula / Graft Assessment Present;Thrill;Bruit  Status Deaccessed  Needle Size 15  Drainage Description None

## 2019-09-20 NOTE — Progress Notes (Signed)
HD Tx completed   09/20/19 1854  Vital Signs  Temp 99.5 F (37.5 C)  Pulse Rate 78  Pulse Rate Source Monitor  Resp 18  BP (!) 143/47  Oxygen Therapy  SpO2 100 %  FiO2 (%) 30 %  During Hemodialysis Assessment  Blood Flow Rate (mL/min) 400 mL/min  Arterial Pressure (mmHg) -150 mmHg  Venous Pressure (mmHg) 180 mmHg  Transmembrane Pressure (mmHg) 60 mmHg  Ultrafiltration Rate (mL/min) 710 mL/min  Dialysate Flow Rate (mL/min) 600 ml/min  Conductivity: Machine  14  HD Safety Checks Performed Yes  Intra-Hemodialysis Comments Tx completed

## 2019-09-20 NOTE — H&P (Signed)
[facility] NAME: Phyllis [name]    MR#:  [id number]  DATE OF BIRTH:  1946-07-24  DATE OF ADMISSION:  09/20/2019  PRIMARY CARE PHYSICIAN: [name], MD   REQUESTING/REFERRING PHYSICIAN: Paulette Blanch, MD  CHIEF COMPLAINT:   Chief Complaint  Patient presents with   Respiratory Distress    HISTORY OF PRESENT ILLNESS:  Phyllis [name]  is a 73 y.o. female with a known history of COPD, CHF, ESRD on HD T/Th/Saturday is being admitted for acute on chronic hypoxic respiratory failure likely due to fluid overload.  She was brought via EMS from home with a chief complaint of respiratory distress.  Room air saturation 60% found by fire department.  Administered 1 DuoNeb per EMS.  Arrives to the ED on CPAP.  Complains of shortness of breath and chest tightness.  While in the ED she was intubated without worsening respiratory status. PAST MEDICAL HISTORY:   Past Medical History:  Diagnosis Date   (HFpEF) heart failure with preserved ejection fraction ([place])    a. TTE 01/2014: nl LV sys fxn, no valvular abnormalities; b. TTE 11/16: nl EF, mild LVH;  c. 04/2019 Echo: EF 60-65%. DD. Nl RV fxn. Mod AS. 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   Allergy    Anemia of chronic disease    Anxiety    Aortic atherosclerosis (HCC)    Asthma    Chronic back pain    COPD (chronic obstructive pulmonary disease) ([place])    Diabetes mellitus with complication ([place])    ESRD on hemodialysis ([place])    a. Tues/Sat; b. 2/2 small kidneys   Essential hypertension    Fistula    lower left arm   GERD (gastroesophageal reflux disease)    Gout    History of exercise stress test    a. 01/2014: no evidence of ischemia; b. Lexiscan 08/2015: no sig ischemia, severe GI uptake artifact, low risk; c. CPET @ [facility] 09/2016: exercised 3 min 12 sec on bike without incline, 2.28 METs, VO2 of 8.1, 48% of predicted, indicating mod to sev  functional impairment, evidence of blunted HR, stroke volume, and BP augmentation as well as ventilation-perfusion mismatch with exercise   HLD (hyperlipidemia)    Moderate aortic stenosis    a. 04/2019 Echo: Mod AS.   Non-obstructive Carotid arterial disease ([place])    a. 12/2017: <50% bilat ICA dzs.   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    right chest   Sleep apnea     PAST SURGICAL HISTORY:   Past Surgical History:  Procedure Laterality Date   carpel tunnel     GALLBLADDER SURGERY     PERIPHERAL VASCULAR CATHETERIZATION N/A 04/12/2015   Procedure: A/V Shuntogram/Fistulagram;  Surgeon: Algernon Huxley, MD;  Location: [facility];  Service: Cardiovascular;  Laterality: N/A;   PERIPHERAL VASCULAR CATHETERIZATION N/A 04/12/2015   Procedure: A/V Shunt Intervention;  Surgeon: Algernon Huxley, MD;  Location: [facility];  Service: Cardiovascular;  Laterality: N/A;   PERIPHERAL VASCULAR CATHETERIZATION N/A 06/09/2015   Procedure: Dialysis/Perma Catheter Removal;  Surgeon: Katha Cabal, MD;  Location: [facility];  Service: Cardiovascular;  Laterality: N/A;    SOCIAL HISTORY:   Social History   Tobacco Use   Smoking status: Never Smoker   Smokeless tobacco: Never Used  Substance Use Topics   Alcohol use: No    FAMILY HISTORY:   Family History  Problem Relation Age of Onset   Hypertension Mother    Hyperlipidemia Mother    Heart disease Father    Heart attack Father 49   Hypertension Father    Hyperlipidemia Father    Heart disease Brother        CABG    Heart attack Brother    Breast cancer Neg Hx     DRUG ALLERGIES:   Allergies  Allergen Reactions   Enalapril Maleate Other (See Comments)    Other reaction(s): Headache   Nitrofurantoin Swelling and Rash    Other Reaction: swelling of body   Sulfamethoxazole-Trimethoprim Swelling   2,4-D Dimethylamine (Amisol) Rash and Other (See Comments)    Other Reaction: h/a     Baclofen Other (See Comments) and Nausea Only    lightheadness ,drowsiness , muscle weakness , twitching in hands    Neosporin [Neomycin-Bacitracin Zn-Polymyx] Other (See Comments) and Rash    Other Reaction: irritation Skin irritation    Quinine Nausea And Vomiting, Rash and Other (See Comments)    Other Reaction: Vomiting, rash, h/a, vision   Ultram [Tramadol] Palpitations   Zocor [Simvastatin] Other (See Comments) and Rash    Other Reaction: muscle spasms Muscle pain and spasms   Bactrim [Sulfamethoxazole-Trimethoprim] Swelling   Levodopa Other (See Comments)    Reaction: unknown   Macrodantin [Nitrofurantoin Macrocrystal] Swelling   Quinine Derivatives Other (See Comments)    Vertigo,nausea vomiting blurred vision headache ears sensitivity     REVIEW OF SYSTEMS:  ROS: Unable to obtain as she is intubated MEDICATIONS AT HOME:   Prior to Admission medications   Medication Sig Start Date End Date Taking? Authorizing Provider  albuterol (PROVENTIL HFA;VENTOLIN HFA) 108 (90 Base) MCG/ACT inhaler Inhale 2 puffs into the lungs every 6 (six) hours as needed for wheezing or shortness of breath.   Yes [provider]  albuterol (PROVENTIL) (2.5 MG/3ML) 0.083% nebulizer solution Take 3 mLs (2.5 mg total) by nebulization every 6 (six) hours as needed for wheezing or shortness of breath. 05/04/19  Yes Nance Pear, MD  amLODipine (NORVASC) 10 MG tablet Take 10 mg by mouth daily.  12/29/13  Yes [provider]  calcium acetate (PHOSLO) 667 MG capsule Take 1,334 mg by mouth 3 (three) times daily with meals.    Yes [provider]  carvedilol (COREG) 12.5 MG tablet Take 12.5 mg by mouth 2 (two) times daily with a meal.  01/09/14  Yes [provider]  cetirizine (ZYRTEC) 10 MG tablet Take 10 mg by mouth daily as needed for allergies.    Yes [provider]  clopidogrel (PLAVIX) 75 MG tablet Take 1 tablet (75 mg total) by mouth daily. 01/18/18   Yes Epifanio Lesches, MD  Fluticasone-Salmeterol (ADVAIR) 250-50 MCG/DOSE AEPB Inhale 1 puff into the lungs 2 (two) times daily as needed (for shortness of breath). 09/07/19  Yes Henreitta Leber, MD  furosemide (LASIX) 80 MG tablet Take 1 tablet (80 mg total) by mouth daily. 03/17/18  Yes Wieting, Richard, MD  hydrALAZINE (APRESOLINE) 100 MG tablet Take 1 tablet (100 mg total) by mouth 3 (three) times daily. 06/02/19  Yes Gollan, Kathlene November, MD  HYDROcodone-acetaminophen (NORCO) 7.5-325 MG tablet Take 1-2 tablets by mouth daily as needed (pain).  04/09/19  Yes [provider]  lidocaine-prilocaine (EMLA) cream Apply 1 application topically as needed (prior to treatment).    Yes [provider]  losartan (COZAAR) 50 MG tablet Take 50 mg by mouth daily. 03/18/19  Yes [provider]  omeprazole (PRILOSEC) 20 MG capsule Take 1 capsule (20 mg total) by mouth 2 (two) times daily before a meal. 09/07/19  Yes Sainani, Belia Heman, MD  ondansetron (ZOFRAN) 4 MG tablet Take 4 mg by mouth every 8 (eight) hours as needed. 05/20/19  Yes [provider]  oxybutynin (DITROPAN XL) 15 MG 24 hr tablet Take 15 mg by mouth daily. 12/24/17  Yes [provider]  pravastatin (PRAVACHOL) 40 MG tablet Take 40 mg by mouth at bedtime.    Yes [provider]  topiramate (TOPAMAX) 25 MG tablet Take 1 tablet (25 mg total) by mouth as needed (for headaches). 09/07/19  Yes Sainani, Belia Heman, MD      VITAL SIGNS:  Blood pressure (!) 134/54, pulse 81, resp. rate (!) 25, height 4\' 10"  (1.473 m), weight 61.2 kg, SpO2 100 %.  PHYSICAL EXAMINATION:  Physical Exam  GENERAL:  73 y.o.-year-old patient lying in the bed  EYES: Pupils equal, round, reactive to light and accommodation. No scleral icterus. Extraocular muscles intact.  HEENT: Head atraumatic, normocephalic. Oropharynx and nasopharynx clear.  ETT in place NECK:  Supple, no jugular venous distention. No thyroid enlargement, no  tenderness.  LUNGS: Decreased breath sounds bilaterally, no wheezing, + rales,rhonchi, no crepitation. No use of accessory muscles of respiration.  CARDIOVASCULAR: S1, S2 normal. No murmurs, rubs, or gallops.  ABDOMEN: Soft, nontender, nondistended. Bowel sounds present. No organomegaly or mass.  EXTREMITIES: No pedal edema, cyanosis, or clubbing.  NEUROLOGIC: Sedated on vent PSYCHIATRIC: Sedated on vent SKIN: No obvious rash, lesion, or ulcer.  LABORATORY PANEL:   CBC Recent Labs  Lab 09/20/19 0526  WBC 15.1*  HGB 7.9*  HCT 24.5*  PLT 206   ------------------------------------------------------------------------------------------------------------------  Chemistries  Recent Labs  Lab 09/20/19 0526  NA 138  K 3.7  CL 99  CO2 25  GLUCOSE 190*  BUN 49*  CREATININE 6.54*  CALCIUM 7.8*  AST 16  ALT 11  ALKPHOS 68  BILITOT 0.7   ------------------------------------------------------------------------------------------------------------------  Cardiac Enzymes No results for input(s): TROPONINI in the last 168 hours. ------------------------------------------------------------------------------------------------------------------  RADIOLOGY:  Ct Chest Wo Contrast  Result Date: 09/19/2019 CLINICAL DATA:  Midsternal chest pain for 2 weeks with shortness of breath. Nonsmoker without history of cancer. EXAM: CT CHEST WITHOUT CONTRAST TECHNIQUE: Multidetector CT imaging of the chest was performed following the standard protocol without IV contrast. COMPARISON:  09/05/2019 chest radiograph. Most recent CT of 09/02/2008 FINDINGS: Cardiovascular: Advanced aortic and branch vessel atherosclerosis. Tortuous thoracic aorta. Mild cardiomegaly, without pericardial effusion. Multivessel coronary artery atherosclerosis. Aortic valve calcification. Mediastinum/Nodes: Hypoattenuating left thyroid nodule of 1.2 cm, new but nonspecific. No mediastinal or definite hilar adenopathy, given  limitations of unenhanced CT. Lungs/Pleura: No pleural fluid.  Clear lungs. Upper Abdomen: Nonspecific caudate lobe enlargement. Similar. Normal imaged portions of the spleen, adrenal glands. Renal atrophy bilaterally. An upper pole left renal exophytic 1.8 cm cyst. Pancreatic tail 1.8 cm lesion on 114/2 is minimally enlarged compared to 2009 where it measured 1.5 cm. Calcification in its inferior portion. There is also a incompletely imaged pancreatic body lesion on 125/2, on the order of 1.8 cm-similar. Suggestion of peripancreatic inflammation, especially about the pancreatic tail lesion on 113/2. Cholecystectomy. Musculoskeletal: Lower thoracic spondylosis. No sternal fracture or retrosternal fluid. IMPRESSION: 1.  No acute process in the chest. 2. Coronary artery atherosclerosis. Aortic Atherosclerosis (ICD10-I70.0). 3. Pancreatic cystic lesions, suboptimally and incompletely evaluated. Suspicion of peripancreatic edema. Correlate with symptoms to suggest acute on chronic pancreatitis.  Consider follow-up with dedicated MRI or CT. 4. Chronic caudate lobe prominence, nonspecific. Correlate with any risk factors for cirrhosis. Electronically Signed   By: Abigail Miyamoto M.D.   On: 09/19/2019 12:28   Dg Chest Port 1 View  Result Date: 09/20/2019 CLINICAL DATA:  Intubated. Respiratory distress. EXAM: PORTABLE CHEST 1 VIEW COMPARISON:  09/05/2019 FINDINGS: The endotracheal tube tip is at the thoracic inlet. Borderline enlarged cardiac silhouette. Tortuous and calcified thoracic aorta. Extensive patchy bilateral airspace opacity, greater on the right. No pleural fluid. Thoracic spine degenerative changes. IMPRESSION: 1. Endotracheal tube tip at the thoracic inlet. It is recommended that this be advanced 4.3 cm. 2. Extensive patchy bilateral probable pneumonia. 3. Aortic atherosclerosis. Electronically Signed   By: Claudie Revering M.D.   On: 09/20/2019 06:41   IMPRESSION AND PLAN:  73 year old female being admitted  for acute on chronic hypoxic respiratory failure  *Acute on chronic hypoxic respiratory failure -Likely due to fluid overload due to flash pulmonary edema, will need urgent hemodialysis -Currently intubated -Intensivist notified for vent management and nephrologist for dialysis need  *ESRD -We will need urgent hemodialysis.  Dr. Candiss Norse aware  *Pneumonia -Start on Rocephin and Zithromax  *Anemia of chronic kidney disease -Monitor H&H   Case discussed with Dr. Patsey Berthold.  We will sign off at this time.  This patient will be managed by PCCM team for now   All the records are reviewed and case discussed with ED provider. Management plans discussed with the patient, intensivist, nursing and they are in agreement.  CODE STATUS: Full code  TOTAL TIME (critical care) TAKING CARE OF THIS PATIENT: 45 minutes.    Max Sane M.D on 09/20/2019 at 8:35 AM  Between 7am to 6pm - Pager - [phone]  After 6pm go to [email or web address] - Proofreader  [facility]  Office  [phone]  CC: Primary care physician; [name], MD   Note: This dictation was prepared with Dragon dictation along with smaller phrase technology. Any transcriptional errors that result from this process are unintentional.

## 2019-09-20 NOTE — Progress Notes (Signed)
HD Tx Start    09/20/19 1515  Vital Signs  Temp 97.9 F (36.6 C)  Pulse Rate 75  Resp (!) 9  Oxygen Therapy  SpO2 100 %  O2 Device Ventilator  FiO2 (%) 30 %  During Hemodialysis Assessment  Blood Flow Rate (mL/min) 400 mL/min  Arterial Pressure (mmHg) -140 mmHg  Venous Pressure (mmHg) 140 mmHg  Transmembrane Pressure (mmHg) 60 mmHg  Ultrafiltration Rate (mL/min) 570 mL/min  Dialysate Flow Rate (mL/min) 600 ml/min  Conductivity: Machine  14.1  HD Safety Checks Performed Yes  Dialysis Fluid Bolus Normal Saline  Bolus Amount (mL) 250 mL  Intra-Hemodialysis Comments Tx initiated

## 2019-09-20 NOTE — ED Provider Notes (Signed)
[facility] Emergency Department Provider Note   ____________________________________________   First MD Initiated Contact with Patient 09/20/19 [phone]     (approximate)  I have reviewed the triage vital signs and the nursing notes.   HISTORY  Chief Complaint Respiratory Distress    HPI Phyllis [name] is a 73 y.o. female brought to the ED via EMS from home with a chief complaint of respiratory distress.  Patient has a history of COPD, CHF, ESRD on HD T/Th/Saturday who presents with progressive shortness of breath.  Room air saturation 60% found by fire department.  Administered 1 DuoNeb per EMS.  Arrives to the ED on CPAP.  Complains of shortness of breath and chest tightness.  Denies fever, abdominal pain, nausea, vomiting, diarrhea.       Past Medical History:  Diagnosis Date   (HFpEF) heart failure with preserved ejection fraction ([place])    a. TTE 01/2014: nl LV sys fxn, no valvular abnormalities; b. TTE 11/16: nl EF, mild LVH;  c. 04/2019 Echo: EF 60-65%. DD. Nl RV fxn. Mod AS. 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   Allergy    Anemia of chronic disease    Anxiety    Aortic atherosclerosis (HCC)    Asthma    Chronic back pain    COPD (chronic obstructive pulmonary disease) ([place])    Diabetes mellitus with complication ([place])    ESRD on hemodialysis ([place])    a. Tues/Sat; b. 2/2 small kidneys   Essential hypertension    Fistula    lower left arm   GERD (gastroesophageal reflux disease)    Gout    History of exercise stress test    a. 01/2014: no evidence of ischemia; b. Lexiscan 08/2015: no sig ischemia, severe GI uptake artifact, low risk; c. CPET @ [facility] 09/2016: exercised 3 min 12 sec on bike without incline, 2.28 METs, VO2 of 8.1, 48% of predicted, indicating mod to sev functional impairment, evidence of blunted HR, stroke volume, and BP augmentation as well as ventilation-perfusion mismatch with exercise   HLD  (hyperlipidemia)    Moderate aortic stenosis    a. 04/2019 Echo: Mod AS.   Non-obstructive Carotid arterial disease ([place])    a. 12/2017: <50% bilat ICA dzs.   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    right chest   Sleep apnea     Patient Active Problem List   Diagnosis Date Noted   COPD with acute bronchitis ([place]) 09/05/2019   Acute CHF (congestive heart failure) ([place]) 05/24/2019   Hyperkalemia 11/29/2018   Chronic diastolic heart failure ([place]) 11/02/2018   HTN (hypertension) 11/02/2018   Uncontrolled hypertension 10/21/2018   Pressure injury of skin 03/16/2018   CVA (cerebral vascular accident) ([place]) 01/16/2018   Aortic atherosclerosis ([place]) 12/11/2017   Chest pain 09/18/2017   Hyperlipidemia [id number]   Complication from renal dialysis device 04/12/2015   SOB (shortness of breath) 02/01/2015   End stage renal disease on dialysis ([place]) 02/01/2015   Type 2 diabetes mellitus with other specified complication ([place]) [id number]   Asthma 02/01/2015   Acute on chronic diastolic CHF (congestive heart failure) ([place]) 02/01/2015    Past Surgical History:  Procedure Laterality Date   carpel tunnel     GALLBLADDER SURGERY     PERIPHERAL VASCULAR CATHETERIZATION N/A 04/12/2015   Procedure: A/V Shuntogram/Fistulagram;  Surgeon: Algernon Huxley, MD;  Location: [facility];  Service: Cardiovascular;  Laterality: N/A;   PERIPHERAL VASCULAR CATHETERIZATION N/A 04/12/2015  Procedure: A/V Shunt Intervention;  Surgeon: Algernon Huxley, MD;  Location: [facility];  Service: Cardiovascular;  Laterality: N/A;   PERIPHERAL VASCULAR CATHETERIZATION N/A 06/09/2015   Procedure: Dialysis/Perma Catheter Removal;  Surgeon: Katha Cabal, MD;  Location: [facility];  Service: Cardiovascular;  Laterality: N/A;    Prior to Admission medications   Medication Sig Start Date End Date Taking? Authorizing Provider  albuterol (PROVENTIL HFA;VENTOLIN HFA) 108  (90 Base) MCG/ACT inhaler Inhale 2 puffs into the lungs every 6 (six) hours as needed for wheezing or shortness of breath.   Yes [provider]  albuterol (PROVENTIL) (2.5 MG/3ML) 0.083% nebulizer solution Take 3 mLs (2.5 mg total) by nebulization every 6 (six) hours as needed for wheezing or shortness of breath. 05/04/19  Yes Nance Pear, MD  amLODipine (NORVASC) 10 MG tablet Take 10 mg by mouth daily.  12/29/13  Yes [provider]  calcium acetate (PHOSLO) 667 MG capsule Take 1,334 mg by mouth 3 (three) times daily with meals.    Yes [provider]  carvedilol (COREG) 12.5 MG tablet Take 12.5 mg by mouth 2 (two) times daily with a meal.  01/09/14  Yes [provider]  cetirizine (ZYRTEC) 10 MG tablet Take 10 mg by mouth daily as needed for allergies.    Yes [provider]  clopidogrel (PLAVIX) 75 MG tablet Take 1 tablet (75 mg total) by mouth daily. 01/18/18  Yes Epifanio Lesches, MD  Fluticasone-Salmeterol (ADVAIR) 250-50 MCG/DOSE AEPB Inhale 1 puff into the lungs 2 (two) times daily as needed (for shortness of breath). 09/07/19  Yes Henreitta Leber, MD  furosemide (LASIX) 80 MG tablet Take 1 tablet (80 mg total) by mouth daily. 03/17/18  Yes Wieting, Richard, MD  hydrALAZINE (APRESOLINE) 100 MG tablet Take 1 tablet (100 mg total) by mouth 3 (three) times daily. 06/02/19  Yes Gollan, Kathlene November, MD  HYDROcodone-acetaminophen (NORCO) 7.5-325 MG tablet Take 1-2 tablets by mouth daily as needed (pain).  04/09/19  Yes [provider]  lidocaine-prilocaine (EMLA) cream Apply 1 application topically as needed (prior to treatment).    Yes [provider]  losartan (COZAAR) 50 MG tablet Take 50 mg by mouth daily. 03/18/19  Yes [provider]  omeprazole (PRILOSEC) 20 MG capsule Take 1 capsule (20 mg total) by mouth 2 (two) times daily before a meal. 09/07/19  Yes Sainani, Belia Heman, MD  ondansetron (ZOFRAN) 4 MG tablet Take 4 mg by  mouth every 8 (eight) hours as needed. 05/20/19  Yes [provider]  oxybutynin (DITROPAN XL) 15 MG 24 hr tablet Take 15 mg by mouth daily. 12/24/17  Yes [provider]  pravastatin (PRAVACHOL) 40 MG tablet Take 40 mg by mouth at bedtime.    Yes [provider]  topiramate (TOPAMAX) 25 MG tablet Take 1 tablet (25 mg total) by mouth as needed (for headaches). 09/07/19  Yes Henreitta Leber, MD    Allergies Enalapril maleate; Nitrofurantoin; Sulfamethoxazole-trimethoprim; 2,4-d dimethylamine (amisol); Baclofen; Neosporin [neomycin-bacitracin zn-polymyx]; Quinine; Ultram [tramadol]; Zocor [simvastatin]; Bactrim [sulfamethoxazole-trimethoprim]; Levodopa; Macrodantin [nitrofurantoin macrocrystal]; and Quinine derivatives  Family History  Problem Relation Age of Onset   Hypertension Mother    Hyperlipidemia Mother    Heart disease Father    Heart attack Father 17   Hypertension Father    Hyperlipidemia Father    Heart disease Brother        CABG    Heart attack Brother    Breast cancer  Neg Hx     Social History Social History   Tobacco Use   Smoking status: Never Smoker   Smokeless tobacco: Never Used  Substance Use Topics   Alcohol use: No   Drug use: No    Review of Systems  Constitutional: No fever/chills Eyes: No visual changes. ENT: No sore throat. Cardiovascular: Positive for chest tightness. Respiratory: Positive for shortness of breath. Gastrointestinal: No abdominal pain.  No nausea, no vomiting.  No diarrhea.  No constipation. Genitourinary: Negative for dysuria. Musculoskeletal: Negative for back pain. Skin: Negative for rash. Neurological: Negative for headaches, focal weakness or numbness.   ____________________________________________   PHYSICAL EXAM:  VITAL SIGNS: ED Triage Vitals  Enc Vitals Group     BP --      Pulse --      Resp --      Temp --      Temp src --      SpO2 09/20/19 0522 90 %     Weight  09/20/19 0524 135 lb (61.2 kg)     Height 09/20/19 0524 4\' 10"  (1.473 m)     Head Circumference --      Peak Flow --      Pain Score --      Pain Loc --      Pain Edu? --      Excl. in [place]? --     Constitutional: Alert and oriented.  Ill appearing and in moderate acute distress. Eyes: Conjunctivae are normal. PERRL. EOMI. Head: Atraumatic. Nose: No congestion/rhinnorhea. Mouth/Throat: Mucous membranes are moist.  Oropharynx non-erythematous. Neck: No stridor.   Cardiovascular: Tachycardic rate, regular rhythm. Grossly normal heart sounds.  Good peripheral circulation. Respiratory: Increased respiratory effort.  No retractions. Lungs diminished with some wheezing. Gastrointestinal: Soft and nontender. No distention. No abdominal bruits. No CVA tenderness. Musculoskeletal: No lower extremity tenderness nor edema.  No joint effusions. Neurologic:  Normal speech and language. No gross focal neurologic deficits are appreciated.  Skin:  Skin is warm, dry and intact. No rash noted. Psychiatric: Mood and affect are normal. Speech and behavior are normal.  ____________________________________________   LABS (all labs ordered are listed, but only abnormal results are displayed)  Labs Reviewed  LACTIC ACID, PLASMA - Abnormal; Notable for the following components:      Result Value   Lactic Acid, Venous 2.1 (*)    All other components within normal limits  CBC WITH DIFFERENTIAL/PLATELET - Abnormal; Notable for the following components:   WBC 15.1 (*)    RBC 2.65 (*)    Hemoglobin 7.9 (*)    HCT 24.5 (*)    Neutro Abs 12.3 (*)    Abs Immature Granulocytes 0.14 (*)    All other components within normal limits  COMPREHENSIVE METABOLIC PANEL - Abnormal; Notable for the following components:   Glucose, Bld 190 (*)    BUN 49 (*)    Creatinine, Ser 6.54 (*)    Calcium 7.8 (*)    GFR calc non Af Amer 6 (*)    GFR calc Af Amer 7 (*)    All other components within normal limits  BRAIN  NATRIURETIC PEPTIDE - Abnormal; Notable for the following components:   B Natriuretic Peptide 1,516.0 (*)    All other components within normal limits  BLOOD GAS, VENOUS - Abnormal; Notable for the following components:   pO2, Ven 66.0 (*)    All other components within normal limits  TROPONIN I (HIGH SENSITIVITY) - Abnormal; Notable for  the following components:   Troponin I (High Sensitivity) 41 (*)    All other components within normal limits  SARS CORONAVIRUS 2 BY RT PCR (HOSPITAL ORDER, [facility])  CULTURE, BLOOD (ROUTINE X 2)  CULTURE, BLOOD (ROUTINE X 2)  LACTIC ACID, PLASMA  TROPONIN I (HIGH SENSITIVITY)   ____________________________________________  EKG  ED ECG REPORT I, Zonnique Norkus J, the attending physician, personally viewed and interpreted this ECG.   Date: 09/20/2019  EKG Time: 0529  Rate: 106  Rhythm: sinus tachycardia  Axis: Normal  Intervals:none  ST&T Change: Nonspecific  ____________________________________________  RADIOLOGY  ED MD interpretation: Patchy bilateral pneumonia  Official radiology report(s): Ct Chest Wo Contrast  Result Date: 09/19/2019 CLINICAL DATA:  Midsternal chest pain for 2 weeks with shortness of breath. Nonsmoker without history of cancer. EXAM: CT CHEST WITHOUT CONTRAST TECHNIQUE: Multidetector CT imaging of the chest was performed following the standard protocol without IV contrast. COMPARISON:  09/05/2019 chest radiograph. Most recent CT of 09/02/2008 FINDINGS: Cardiovascular: Advanced aortic and branch vessel atherosclerosis. Tortuous thoracic aorta. Mild cardiomegaly, without pericardial effusion. Multivessel coronary artery atherosclerosis. Aortic valve calcification. Mediastinum/Nodes: Hypoattenuating left thyroid nodule of 1.2 cm, new but nonspecific. No mediastinal or definite hilar adenopathy, given limitations of unenhanced CT. Lungs/Pleura: No pleural fluid.  Clear lungs. Upper Abdomen: Nonspecific  caudate lobe enlargement. Similar. Normal imaged portions of the spleen, adrenal glands. Renal atrophy bilaterally. An upper pole left renal exophytic 1.8 cm cyst. Pancreatic tail 1.8 cm lesion on 114/2 is minimally enlarged compared to 2009 where it measured 1.5 cm. Calcification in its inferior portion. There is also a incompletely imaged pancreatic body lesion on 125/2, on the order of 1.8 cm-similar. Suggestion of peripancreatic inflammation, especially about the pancreatic tail lesion on 113/2. Cholecystectomy. Musculoskeletal: Lower thoracic spondylosis. No sternal fracture or retrosternal fluid. IMPRESSION: 1.  No acute process in the chest. 2. Coronary artery atherosclerosis. Aortic Atherosclerosis (ICD10-I70.0). 3. Pancreatic cystic lesions, suboptimally and incompletely evaluated. Suspicion of peripancreatic edema. Correlate with symptoms to suggest acute on chronic pancreatitis. Consider follow-up with dedicated MRI or CT. 4. Chronic caudate lobe prominence, nonspecific. Correlate with any risk factors for cirrhosis. Electronically Signed   By: Abigail Miyamoto M.D.   On: 09/19/2019 12:28   Dg Chest Port 1 View  Result Date: 09/20/2019 CLINICAL DATA:  Intubated. Respiratory distress. EXAM: PORTABLE CHEST 1 VIEW COMPARISON:  09/05/2019 FINDINGS: The endotracheal tube tip is at the thoracic inlet. Borderline enlarged cardiac silhouette. Tortuous and calcified thoracic aorta. Extensive patchy bilateral airspace opacity, greater on the right. No pleural fluid. Thoracic spine degenerative changes. IMPRESSION: 1. Endotracheal tube tip at the thoracic inlet. It is recommended that this be advanced 4.3 cm. 2. Extensive patchy bilateral probable pneumonia. 3. Aortic atherosclerosis. Electronically Signed   By: Claudie Revering M.D.   On: 09/20/2019 06:41    ____________________________________________   PROCEDURES  Procedure(s) performed (including Critical Care):  Procedures  CRITICAL CARE Performed by:  Paulette Blanch   Total critical care time: 60 minutes  Critical care time was exclusive of separately billable procedures and treating other patients.  Critical care was necessary to treat or prevent imminent or life-threatening deterioration.  Critical care was time spent personally by me on the following activities: development of treatment plan with patient and/or surrogate as well as nursing, discussions with consultants, evaluation of patient's response to treatment, examination of patient, obtaining history from patient or surrogate, ordering and performing treatments and interventions, ordering and review of laboratory  studies, ordering and review of radiographic studies, pulse oximetry and re-evaluation of patient's condition. ____________________________________________   INITIAL IMPRESSION / ASSESSMENT AND PLAN / ED COURSE  As part of my medical decision making, I reviewed the following data within the [place] notes reviewed and incorporated, Labs reviewed, EKG interpreted, Old chart reviewed, Radiograph reviewed, Discussed with admitting physician and Notes from prior ED visits     Phyllis [name] was evaluated in Emergency Department on 09/20/2019 for the symptoms described in the history of present illness. She was evaluated in the context of the global COVID-19 pandemic, which necessitated consideration that the patient might be at risk for infection with the SARS-CoV-2 virus that causes COVID-19. Institutional protocols and algorithms that pertain to the evaluation of patients at risk for COVID-19 are in a state of rapid change based on information released by regulatory bodies including the CDC and federal and state organizations. These policies and algorithms were followed during the patient's care in the ED.    73 year old female with COPD, CHF, ESRD on HD who presents in acute respiratory distress. Differential includes, but is not limited to,  viral syndrome, bronchitis including COPD exacerbation, pneumonia, reactive airway disease including asthma, CHF including exacerbation with or without pulmonary/interstitial edema, pneumothorax, ACS, thoracic trauma, and pulmonary embolism.  Will obtain septic work-up, Covid swab, chest x-ray.  Switch to BiPAP upon her arrival to the treatment room.  Administer 125 mg IV Solu-Medrol, 2 g IV magnesium and DuoNebs.   Clinical Course as of Sep 20 711  Sat Sep 20, 2019  0619 Patient became fatigued and was intubated.  Trying to contact family.   [JS]  [id number] There is no family listed in the computer for patient.  She is listed as single and only has 2 friends listed for contacts.  Covid is negative.  Will admit patient to this facility.   [JS]  [id number] Lactic acid and chest x-ray noted.  Will initiate code sepsis and administer IV antibiotics.  Spoke with nephrology Dr. Candiss Norse and updated him that patient will need dialysis today.   [JS]    Clinical Course User Index [JS] Paulette Blanch, MD     ____________________________________________   FINAL CLINICAL IMPRESSION(S) / ED DIAGNOSES  Final diagnoses:  Respiratory distress  COPD exacerbation ([place])  Congestive heart failure, unspecified HF chronicity, unspecified heart failure type ([place])  ESRD (end stage renal disease) on dialysis ([place])  Hypoxia  Community acquired pneumonia, unspecified laterality     ED Discharge Orders    None       Note:  This document was prepared using Dragon voice recognition software and may include unintentional dictation errors.   Paulette Blanch, MD 09/20/19 [phone]

## 2019-09-20 NOTE — Progress Notes (Signed)
CODE SEPSIS - PHARMACY COMMUNICATION  **Broad Spectrum Antibiotics should be administered within 1 hour of Sepsis diagnosis**  Time Code Sepsis Called/Page Received: 0650  Antibiotics Ordered: Rocephin and Zithromax  Time of 1st antibiotic administration: ~ 0740  Additional action taken by pharmacy: Spoke to RN, tied up intubating patient  If necessary, Name of Provider/Nurse Contacted: Gloris Ham, Elayne Snare ,PharmD Clinical Pharmacist  09/20/2019  7:11 AM

## 2019-09-20 NOTE — Progress Notes (Signed)
Per MD verbal and CXR results, ETT was advanced from 18cm to 21 cm. Patient tolerated ETT adjustment well. ETT secured. Patient suctioned with no return of secretions. HOB elevated to 30 degrees.

## 2019-09-20 NOTE — Progress Notes (Signed)
Pre HD Tx Assessment   09/20/19 1510  Neurological  Level of Consciousness Responds to Pain  Orientation Level Intubated/Tracheostomy - Unable to assess  Respiratory  Respiratory Pattern Regular  Chest Assessment Chest expansion symmetrical  Bilateral Breath Sounds Rhonchi;Diminished  R Upper  Breath Sounds Rhonchi  L Upper Breath Sounds Rhonchi  R Lower Breath Sounds Diminished  L Lower Breath Sounds Diminished  Cough None (Intubated)  Cardiac  Pulse Regular  Cardiac Rhythm NSR  Vascular  R Radial Pulse +2  L Radial Pulse +2  R Dorsalis Pedis Pulse +2  L Dorsalis Pedis Pulse +2  Integumentary  Integumentary (WDL) WDL  Musculoskeletal  Musculoskeletal (WDL) X  Generalized Weakness Yes  Gastrointestinal  Bowel Sounds Assessment Hypoactive  GU Assessment  Genitourinary (WDL) X  Genitourinary Symptoms Urinary Catheter  Urine Characteristics  Urine Color Amber  Urine Appearance Cloudy  Psychosocial  Psychosocial (WDL) X  Patient Behaviors  (UTA)

## 2019-09-20 NOTE — Progress Notes (Signed)
Post HD Tx Assessment   09/20/19 1852  Neurological  Level of Consciousness Responds to Pain  Orientation Level Intubated/Tracheostomy - Unable to assess  Respiratory  Respiratory Pattern Regular;Unlabored  Chest Assessment Chest expansion symmetrical  Bilateral Breath Sounds Diminished;Clear  Cardiac  Pulse Regular  Cardiac Rhythm NSR  Vascular  R Radial Pulse +2  L Radial Pulse +2  R Dorsalis Pedis Pulse +2  L Dorsalis Pedis Pulse +2  Integumentary  Integumentary (WDL) WDL  Musculoskeletal  Musculoskeletal (WDL) X  Generalized Weakness Yes  Gastrointestinal  Bowel Sounds Assessment Hypoactive  GU Assessment  Genitourinary (WDL) X  Genitourinary Symptoms Urinary Catheter  Urine Characteristics  Urine Color Amber  Urine Appearance Cloudy  Psychosocial  Psychosocial (WDL) X  Patient Behaviors  (UTA)

## 2019-09-20 NOTE — Plan of Care (Signed)
  Problem: Clinical Measurements: Goal: Ability to maintain clinical measurements within normal limits will improve Outcome: Progressing Goal: Will remain free from infection Outcome: Progressing Goal: Diagnostic test results will improve Outcome: Progressing Goal: Respiratory complications will improve Outcome: Progressing Goal: Cardiovascular complication will be avoided Outcome: Progressing   Problem: Skin Integrity: Goal: Risk for impaired skin integrity will decrease Outcome: Progressing  Pt is currently receiving HD Tx in the ICU setting. Pt is currently intubated with Propofol continuous infusion. BP WDL to receive Tx. Will keep monitoring

## 2019-09-20 NOTE — Consult Note (Addendum)
Reason for Consult: Assistance with ventilator management Referring Physician: Max Sane, MD  Phyllis [name] is an 73 y.o. female.  HPI: 73 year old female, lifelong never smoker with ESRD on hemodialysis Tuesday Thursday Saturday and history as noted below presented with acute hypoxic respiratory failure due to volume overload.  The patient cannot provide history as she is currently intubated and mechanically ventilated.  She was brought via EMS from home with a chief complaint of increasing respiratory distress.  EMS found the patient to have saturations of 60% on room air.  They placed the patient on CPAP gave her a DuoNeb and transported her to the emergency room.  In the emergency room she was complaining of shortness of breath and chest tightness.  She was intubated due to increasing and worsening respiratory status.  She has now in the intensive care unit intubated and mechanically ventilated.  We are asked to assist with management.  I have reviewed the chest x-rays independently.  Initial chest x-ray had a pattern of asymmetric pulmonary edema mostly on the right side.  Post intubation this has almost completely cleared consistent with edema and not with infectious process.    Past Medical History:  Diagnosis Date  . (HFpEF) heart failure with preserved ejection fraction ([place])    a. TTE 01/2014: nl LV sys fxn, no valvular abnormalities; b. TTE 11/16: nl EF, mild LVH;  c. 04/2019 Echo: EF 60-65%. DD. Nl RV fxn. Mod AS. 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  . Allergy   . Anemia of chronic disease   . Anxiety   . Aortic atherosclerosis ([place])   . Asthma   . Chronic back pain   . COPD (chronic obstructive pulmonary disease) ([place])   . Diabetes mellitus with complication ([place])   . ESRD on hemodialysis ([place])    a. Tues/Sat; b. 2/2 small kidneys  . Essential hypertension   . Fistula    lower left arm  . GERD (gastroesophageal reflux disease)   . Gout   . History of  exercise stress test    a. 01/2014: no evidence of ischemia; b. Lexiscan 08/2015: no sig ischemia, severe GI uptake artifact, low risk; c. CPET @ [facility] 09/2016: exercised 3 min 12 sec on bike without incline, 2.28 METs, VO2 of 8.1, 48% of predicted, indicating mod to sev functional impairment, evidence of blunted HR, stroke volume, and BP augmentation as well as ventilation-perfusion mismatch with exercise  . HLD (hyperlipidemia)   . Moderate aortic stenosis    a. 04/2019 Echo: Mod AS.  . Non-obstructive Carotid arterial disease ([place])    a. 12/2017: <50% bilat ICA dzs.  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 Sleep apnea     Past Surgical History:  Procedure Laterality Date  . carpel tunnel    . GALLBLADDER SURGERY    . PERIPHERAL VASCULAR CATHETERIZATION N/A 04/12/2015   Procedure: A/V Shuntogram/Fistulagram;  Surgeon: Algernon Huxley, MD;  Location: [facility];  Service: Cardiovascular;  Laterality: N/A;  . PERIPHERAL VASCULAR CATHETERIZATION N/A 04/12/2015   Procedure: A/V Shunt Intervention;  Surgeon: Algernon Huxley, MD;  Location: [facility];  Service: Cardiovascular;  Laterality: N/A;  . PERIPHERAL VASCULAR CATHETERIZATION N/A 06/09/2015   Procedure: Dialysis/Perma Catheter Removal;  Surgeon: Katha Cabal, MD;  Location: [facility];  Service: Cardiovascular;  Laterality: N/A;    Family History  Problem Relation Age of Onset  . Hypertension Mother   .  Hyperlipidemia Mother   . Heart disease Father   . Heart attack Father 21  . Hypertension Father   . Hyperlipidemia Father   . Heart disease Brother        CABG   . Heart attack Brother   . Breast cancer Neg Hx     Social History:  reports that she has never smoked. She has never used smokeless tobacco. She reports that she does not drink alcohol or use drugs.  Allergies:  Allergies  Allergen Reactions  . Enalapril Maleate Other (See Comments)    Other reaction(s): Headache  .  Nitrofurantoin Swelling and Rash    Other Reaction: swelling of body  . Sulfamethoxazole-Trimethoprim Swelling  . 2,4-D Dimethylamine (Amisol) Rash and Other (See Comments)    Other Reaction: h/a  . Baclofen Other (See Comments) and Nausea Only    lightheadness ,drowsiness , muscle weakness , twitching in hands   . Neosporin [Neomycin-Bacitracin Zn-Polymyx] Other (See Comments) and Rash    Other Reaction: irritation Skin irritation   . Quinine Nausea And Vomiting, Rash and Other (See Comments)    Other Reaction: Vomiting, rash, h/a, vision  . Ultram [Tramadol] Palpitations  . Zocor [Simvastatin] Other (See Comments) and Rash    Other Reaction: muscle spasms Muscle pain and spasms  . Bactrim [Sulfamethoxazole-Trimethoprim] Swelling  . Levodopa Other (See Comments)    Reaction: unknown  . Macrodantin [Nitrofurantoin Macrocrystal] Swelling  . Quinine Derivatives Other (See Comments)    Vertigo,nausea vomiting blurred vision headache ears sensitivity     Medications:  I have reviewed the patient's current medications. Prior to Admission:  Medications Prior to Admission  Medication Sig Dispense Refill Last Dose  . albuterol (PROVENTIL HFA;VENTOLIN HFA) 108 (90 Base) MCG/ACT inhaler Inhale 2 puffs into the lungs every 6 (six) hours as needed for wheezing or shortness of breath.   prn at prn  . albuterol (PROVENTIL) (2.5 MG/3ML) 0.083% nebulizer solution Take 3 mLs (2.5 mg total) by nebulization every 6 (six) hours as needed for wheezing or shortness of breath. 75 mL 1 prn at prn  . amLODipine (NORVASC) 10 MG tablet Take 10 mg by mouth daily.    Past Month at Unknown time  . calcium acetate (PHOSLO) 667 MG capsule Take 1,334 mg by mouth 3 (three) times daily with meals.    prn at prn  . carvedilol (COREG) 12.5 MG tablet Take 12.5 mg by mouth 2 (two) times daily with a meal.    Past Month at Unknown time  . cetirizine (ZYRTEC) 10 MG tablet Take 10 mg by mouth daily as needed for  allergies.    prn at prn  . clopidogrel (PLAVIX) 75 MG tablet Take 1 tablet (75 mg total) by mouth daily. 30 tablet 0 Past Month at Unknown time  . Fluticasone-Salmeterol (ADVAIR) 250-50 MCG/DOSE AEPB Inhale 1 puff into the lungs 2 (two) times daily as needed (for shortness of breath). 60 each  prn at prn  . furosemide (LASIX) 80 MG tablet Take 1 tablet (80 mg total) by mouth daily. 30 tablet 0 Past Month at Unknown time  . hydrALAZINE (APRESOLINE) 100 MG tablet Take 1 tablet (100 mg total) by mouth 3 (three) times daily. 270 tablet 3 Past Month at Unknown time  . HYDROcodone-acetaminophen (NORCO) 7.5-325 MG tablet Take 1-2 tablets by mouth daily as needed (pain).    prn at prn  . lidocaine-prilocaine (EMLA) cream Apply 1 application topically as needed (prior to treatment).    prn at  prn  . losartan (COZAAR) 50 MG tablet Take 50 mg by mouth daily.   Past Month at Unknown time  . omeprazole (PRILOSEC) 20 MG capsule Take 1 capsule (20 mg total) by mouth 2 (two) times daily before a meal.   Past Month at Unknown time  . ondansetron (ZOFRAN) 4 MG tablet Take 4 mg by mouth every 8 (eight) hours as needed.   prn at prn  . oxybutynin (DITROPAN XL) 15 MG 24 hr tablet Take 15 mg by mouth daily.   Past Month at Unknown time  . pravastatin (PRAVACHOL) 40 MG tablet Take 40 mg by mouth at bedtime.    Past Month at Unknown time  . topiramate (TOPAMAX) 25 MG tablet Take 1 tablet (25 mg total) by mouth as needed (for headaches).   Past Month at Unknown time    Results for orders placed or performed during the hospital encounter of 09/20/19 (from the past 48 hour(s))  Lactic acid, plasma     Status: Abnormal   Collection Time: 09/20/19  5:26 AM  Result Value Ref Range   Lactic Acid, Venous 2.1 (HH) 0.5 - 1.9 mmol/L    Comment: CRITICAL RESULT CALLED TO, READ BACK BY AND VERIFIED WITH REBECCA [email or web address]  ON 09/20/19 BY HKP Performed at [facility], [address] [place], [place] [address]   CBC  with Differential     Status: Abnormal   Collection Time: 09/20/19  5:26 AM  Result Value Ref Range   WBC 15.1 (H) 4.0 - 10.5 K/uL   RBC 2.65 (L) 3.87 - 5.11 MIL/uL   Hemoglobin 7.9 (L) 12.0 - 15.0 g/dL   HCT 24.5 (L) 36.0 - 46.0 %   MCV 92.5 80.0 - 100.0 fL   MCH 29.8 26.0 - 34.0 pg   MCHC 32.2 30.0 - 36.0 g/dL   RDW 14.2 11.5 - 15.5 %   Platelets 206 150 - 400 K/uL   nRBC 0.0 0.0 - 0.2 %   Neutrophils Relative % 82 %   Neutro Abs 12.3 (H) 1.7 - 7.7 K/uL   Lymphocytes Relative 9 %   Lymphs Abs 1.4 0.7 - 4.0 K/uL   Monocytes Relative 5 %   Monocytes Absolute 0.8 0.1 - 1.0 K/uL   Eosinophils Relative 3 %   Eosinophils Absolute 0.4 0.0 - 0.5 K/uL   Basophils Relative 0 %   Basophils Absolute 0.0 0.0 - 0.1 K/uL   Immature Granulocytes 1 %   Abs Immature Granulocytes 0.14 (H) 0.00 - 0.07 K/uL    Comment: Performed at [facility], [address] [place], [place] [address]  Comprehensive metabolic panel     Status: Abnormal   Collection Time: 09/20/19  5:26 AM  Result Value Ref Range   Sodium 138 135 - 145 mmol/L   Potassium 3.7 3.5 - 5.1 mmol/L   Chloride 99 98 - 111 mmol/L   CO2 25 22 - 32 mmol/L   Glucose, Bld 190 (H) 70 - 99 mg/dL   BUN 49 (H) 8 - 23 mg/dL   Creatinine, Ser 6.54 (H) 0.44 - 1.00 mg/dL   Calcium 7.8 (L) 8.9 - 10.3 mg/dL   Total Protein 6.8 6.5 - 8.1 g/dL   Albumin 3.9 3.5 - 5.0 g/dL   AST 16 15 - 41 U/L   ALT 11 0 - 44 U/L   Alkaline Phosphatase 68 38 - 126 U/L   Total Bilirubin 0.7 0.3 - 1.2 mg/dL   GFR calc non Af Wyvonnia Lora  6 (L) >60 mL/min   GFR calc Af Amer 7 (L) >60 mL/min   Anion gap 14 5 - 15    Comment: Performed at [facility], [address] [place], [place] [address]  Brain natriuretic peptide     Status: Abnormal   Collection Time: 09/20/19  5:26 AM  Result Value Ref Range   B Natriuretic Peptide 1,516.0 (H) 0.0 - 100.0 pg/mL    Comment: Performed at [facility], [place], [place] [address]   Troponin I (High Sensitivity)     Status: Abnormal   Collection Time: 09/20/19  5:26 AM  Result Value Ref Range   Troponin I (High Sensitivity) 41 (H) <18 ng/L    Comment: (NOTE) Elevated high sensitivity troponin I (hsTnI) values and significant  changes across serial measurements may suggest ACS but many other  chronic and acute conditions are known to elevate hsTnI results.  Refer to the "Links" section for chest pain algorithms and additional  guidance. Performed at [facility], [address] [place], [place] [address]   SARS Coronavirus 2 by RT PCR (hospital order, performed in [facility] hospital lab) Nasopharyngeal Nasopharyngeal Swab     Status: None   Collection Time: 09/20/19  5:26 AM   Specimen: Nasopharyngeal Swab  Result Value Ref Range   SARS Coronavirus 2 NEGATIVE NEGATIVE    Comment: (NOTE) If result is NEGATIVE SARS-CoV-2 target nucleic acids are NOT DETECTED. The SARS-CoV-2 RNA is generally detectable in upper and lower  respiratory specimens during the acute phase of infection. The lowest  concentration of SARS-CoV-2 viral copies this assay can detect is 250  copies / mL. A negative result does not preclude SARS-CoV-2 infection  and should not be used as the sole basis for treatment or other  patient management decisions.  A negative result may occur with  improper specimen collection / handling, submission of specimen other  than nasopharyngeal swab, presence of viral mutation(s) within the  areas targeted by this assay, and inadequate number of viral copies  (<250 copies / mL). A negative result must be combined with clinical  observations, patient history, and epidemiological information. If result is POSITIVE SARS-CoV-2 target nucleic acids are DETECTED. The SARS-CoV-2 RNA is generally detectable in upper and lower  respiratory specimens dur ing the acute phase of infection.  Positive  results are indicative of active infection with  SARS-CoV-2.  Clinical  correlation with patient history and other diagnostic information is  necessary to determine patient infection status.  Positive results do  not rule out bacterial infection or co-infection with other viruses. If result is PRESUMPTIVE POSTIVE SARS-CoV-2 nucleic acids MAY BE PRESENT.   A presumptive positive result was obtained on the submitted specimen  and confirmed on repeat testing.  While 2019 novel coronavirus  (SARS-CoV-2) nucleic acids may be present in the submitted sample  additional confirmatory testing may be necessary for epidemiological  and / or clinical management purposes  to differentiate between  SARS-CoV-2 and other Sarbecovirus currently known to infect humans.  If clinically indicated additional testing with an alternate test  methodology [id number]) is advised. The SARS-CoV-2 RNA is generally  detectable in upper and lower respiratory sp ecimens during the acute  phase of infection. The expected result is Negative. Fact Sheet for Patients:  StrictlyIdeas.no Fact Sheet for Healthcare Providers: BankingDealers.co.za This test is not yet approved or cleared by the [place] FDA and has been authorized for detection and/or diagnosis of SARS-CoV-2  by FDA under an Emergency Use Authorization (EUA).  This EUA will remain in effect (meaning this test can be used) for the duration of the COVID-19 declaration under Section 564(b)(1) of the Act, 21 U.S.C. section 360bbb-3(b)(1), unless the authorization is terminated or revoked sooner. Performed at [facility], [address] [place], [place] [address]   Blood gas, venous     Status: Abnormal   Collection Time: 09/20/19  5:49 AM  Result Value Ref Range   Delivery systems BILEVEL POSITIVE AIRWAY PRESSURE    pH, Ven 7.33 7.250 - 7.430   pCO2, Ven 48 44.0 - 60.0 mmHg   pO2, Ven 66.0 (H) 32.0 - 45.0 mmHg   Bicarbonate 25.3 20.0 - 28.0  mmol/L   Acid-base deficit 1.1 0.0 - 2.0 mmol/L   O2 Saturation 91.2 %   Patient temperature 37.0    Collection site VENOUS    Sample type VENOUS     Comment: Performed at [facility], [address] [place], [place] [address]  Lactic acid, plasma     Status: None   Collection Time: 09/20/19  7:56 AM  Result Value Ref Range   Lactic Acid, Venous 0.7 0.5 - 1.9 mmol/L    Comment: Performed at [facility] [place], [place] [address]  Troponin I (High Sensitivity)     Status: Abnormal   Collection Time: 09/20/19  7:56 AM  Result Value Ref Range   Troponin I (High Sensitivity) 32 (H) <18 ng/L    Comment: (NOTE) Elevated high sensitivity troponin I (hsTnI) values and significant  changes across serial measurements may suggest ACS but many other  chronic and acute conditions are known to elevate hsTnI results.  Refer to the "Links" section for chest pain algorithms and additional  guidance. Performed at [facility], [address] [place], [place] [address]   Glucose, capillary     Status: Abnormal   Collection Time: 09/20/19 10:08 AM  Result Value Ref Range   Glucose-Capillary 149 (H) 70 - 99 mg/dL    Ct Chest Wo Contrast  Result Date: 09/19/2019 CLINICAL DATA:  Midsternal chest pain for 2 weeks with shortness of breath. Nonsmoker without history of cancer. EXAM: CT CHEST WITHOUT CONTRAST TECHNIQUE: Multidetector CT imaging of the chest was performed following the standard protocol without IV contrast. COMPARISON:  09/05/2019 chest radiograph. Most recent CT of 09/02/2008 FINDINGS: Cardiovascular: Advanced aortic and branch vessel atherosclerosis. Tortuous thoracic aorta. Mild cardiomegaly, without pericardial effusion. Multivessel coronary artery atherosclerosis. Aortic valve calcification. Mediastinum/Nodes: Hypoattenuating left thyroid nodule of 1.2 cm, new but nonspecific. No mediastinal or definite hilar adenopathy, given limitations  of unenhanced CT. Lungs/Pleura: No pleural fluid.  Clear lungs. Upper Abdomen: Nonspecific caudate lobe enlargement. Similar. Normal imaged portions of the spleen, adrenal glands. Renal atrophy bilaterally. An upper pole left renal exophytic 1.8 cm cyst. Pancreatic tail 1.8 cm lesion on 114/2 is minimally enlarged compared to 2009 where it measured 1.5 cm. Calcification in its inferior portion. There is also a incompletely imaged pancreatic body lesion on 125/2, on the order of 1.8 cm-similar. Suggestion of peripancreatic inflammation, especially about the pancreatic tail lesion on 113/2. Cholecystectomy. Musculoskeletal: Lower thoracic spondylosis. No sternal fracture or retrosternal fluid. IMPRESSION: 1.  No acute process in the chest. 2. Coronary artery atherosclerosis. Aortic Atherosclerosis (ICD10-I70.0). 3. Pancreatic cystic lesions, suboptimally and incompletely evaluated. Suspicion of peripancreatic edema. Correlate with symptoms to suggest acute on chronic pancreatitis. Consider follow-up with dedicated MRI or CT. 4.  Chronic caudate lobe prominence, nonspecific. Correlate with any risk factors for cirrhosis. Electronically Signed   By: Abigail Miyamoto M.D.   On: 09/19/2019 12:28   Dg Chest Port 1 View  Result Date: 09/20/2019 CLINICAL DATA:  Intubated. Respiratory distress. EXAM: PORTABLE CHEST 1 VIEW COMPARISON:  09/05/2019 FINDINGS: The endotracheal tube tip is at the thoracic inlet. Borderline enlarged cardiac silhouette. Tortuous and calcified thoracic aorta. Extensive patchy bilateral airspace opacity, greater on the right. No pleural fluid. Thoracic spine degenerative changes. IMPRESSION: 1. Endotracheal tube tip at the thoracic inlet. It is recommended that this be advanced 4.3 cm. 2. Extensive patchy bilateral probable pneumonia. 3. Aortic atherosclerosis. Electronically Signed   By: Claudie Revering M.D.   On: 09/20/2019 06:41    Review of Systems  Unable to perform ROS: Critical illness    Blood pressure (!) 123/46, pulse 76, resp. rate 19, height 4\' 10"  (1.473 m), weight 63.9 kg, SpO2 100 %. Physical Exam  Constitutional: She appears well-developed and well-nourished. She appears ill. She is sedated and intubated.  Chronically ill appearing  HENT:  Head: Normocephalic and atraumatic.  Right Ear: External ear normal.  Left Ear: External ear normal.  Nose: Nose normal.  Mouth/Throat: Mucous membranes are pale.  Orotracheally intubated. Copious frothy secretions  Eyes: Pupils are equal, round, and reactive to light. Conjunctivae are normal. No scleral icterus.  Neck: Neck supple. No tracheal deviation present. No thyromegaly present.  Cardiovascular: Normal rate and regular rhythm.  Murmur heard.  Systolic murmur is present with a grade of 2/6. AS murmur. AV fistula on L forearm with good palpable thrill.  Respiratory: She is intubated. She has no wheezes. She has rhonchi in the right upper field and the left upper field. She has rales.  Synchronous with vent. Rales throughout  GI: Soft. Bowel sounds are normal. She exhibits no distension.  Musculoskeletal: Normal range of motion.        General: No edema.  Neurological:  Sedated, cannot fully assess  Skin: Skin is warm and dry. There is pallor.  Psychiatric:  Sedated cannot assess    Assessment/Plan:  1.  Acute hypoxic respiratory failure:  This is due to volume overload.She has evidence of asymmetric pulmonary edema which is now clearing on chest x-ray.  She has diastolic dysfunction and aortic stenosis classified as moderate by her last echo.  She will need dialysis for volume removal.  Reassess for potential extubation after dialysis.  Of note she had a chest CT performed yesterday that showed no evidence of pneumonia and mild edema on my review.  2.  Acute on chronic diastolic heart failure: BNP 1, 516; likely cause of decompensation, she also has aortic stenosis which adds complexity to this issue.  Because of  her ESRD however will need dialysis to correct volume overload.  3.  End-stage renal disease: Hemodialysis today per Dr. Candiss Norse.  4.  Lactic acidosis due to hypoxemia: Resolved.  5.  Anemia of chronic kidney disease: Monitor, H&H somewhat lower than her baseline at 7.9 and 24.5 respectively, this may be dilutional effect.  Best practice: GI prophylaxis with PPI, DVT prophylaxis with Heparin subcu. Ventilator discomfort: RASS goal of -1 to -2, propofol and as needed Versed.   DPOA Orbie Pyo, notified of patient's admission to ICU.  PCCM will assume care of the patient while in the ICU.  Total critical care time 60 minutes  C. Derrill Kay, MD [facility] 09/20/2019, 10:17 AM

## 2019-09-20 NOTE — Progress Notes (Signed)
Notified bedside nurse of need to draw repeat lactic acid. 

## 2019-09-21 ENCOUNTER — Inpatient Hospital Stay (HOSPITAL_COMMUNITY)
Admit: 2019-09-21 | Discharge: 2019-09-21 | Disposition: A | Discharge status: Home / Self Care | Payer: Medicare Other | Attending: Pulmonary Disease | Admitting: Pulmonary Disease

## 2019-09-21 DIAGNOSIS — I060 Rheumatic aortic stenosis: Secondary | ICD-10-CM

## 2019-09-21 DIAGNOSIS — N186 End stage renal disease: Secondary | ICD-10-CM

## 2019-09-21 DIAGNOSIS — R0902 Hypoxemia: Secondary | ICD-10-CM

## 2019-09-21 DIAGNOSIS — D631 Anemia in chronic kidney disease: Secondary | ICD-10-CM

## 2019-09-21 DIAGNOSIS — U071 COVID-19: Secondary | ICD-10-CM

## 2019-09-21 DIAGNOSIS — I350 Nonrheumatic aortic (valve) stenosis: Secondary | ICD-10-CM

## 2019-09-21 DIAGNOSIS — Z992 Dependence on renal dialysis: Secondary | ICD-10-CM

## 2019-09-21 DIAGNOSIS — J9601 Acute respiratory failure with hypoxia: Secondary | ICD-10-CM

## 2019-09-21 DIAGNOSIS — I5030 Unspecified diastolic (congestive) heart failure: Secondary | ICD-10-CM

## 2019-09-21 DIAGNOSIS — J810 Acute pulmonary edema: Secondary | ICD-10-CM

## 2019-09-21 HISTORY — DX: COVID-19: U07.1

## 2019-09-21 LAB — CBC
HCT: 21.5 % — ABNORMAL LOW (ref 36.0–46.0)
HCT: 21.2 % — ABNORMAL LOW (ref 36.0–46.0)
Hemoglobin: 7.3 g/dL — ABNORMAL LOW (ref 12.0–15.0)
Hemoglobin: 6.8 g/dL — ABNORMAL LOW (ref 12.0–15.0)
MCH: 29.9 pg (ref 26.0–34.0)
MCH: 28.7 pg (ref 26.0–34.0)
MCHC: 34 g/dL (ref 30.0–36.0)
MCHC: 32.1 g/dL (ref 30.0–36.0)
MCV: 88.1 fL (ref 80.0–100.0)
MCV: 89.5 fL (ref 80.0–100.0)
Platelets: 126 10*3/uL — ABNORMAL LOW (ref 150–400)
Platelets: 126 10*3/uL — ABNORMAL LOW (ref 150–400)
RBC: 2.44 MIL/uL — ABNORMAL LOW (ref 3.87–5.11)
RBC: 2.37 MIL/uL — ABNORMAL LOW (ref 3.87–5.11)
RDW: 14 % (ref 11.5–15.5)
RDW: 15.9 % — ABNORMAL HIGH (ref 11.5–15.5)
WBC: 9.3 10*3/uL (ref 4.0–10.5)
WBC: 8.2 10*3/uL (ref 4.0–10.5)
nRBC: 0 % (ref 0.0–0.2)
nRBC: 0 % (ref 0.0–0.2)

## 2019-09-21 LAB — ECHOCARDIOGRAM COMPLETE
Height: 62 in
Weight: 2088.2 oz

## 2019-09-21 LAB — BASIC METABOLIC PANEL
Anion gap: 17 — ABNORMAL HIGH (ref 5–15)
BUN: 18 mg/dL (ref 8–23)
CO2: 26 mmol/L (ref 22–32)
Calcium: 8.4 mg/dL — ABNORMAL LOW (ref 8.9–10.3)
Chloride: 97 mmol/L — ABNORMAL LOW (ref 98–111)
Creatinine, Ser: 3.05 mg/dL — ABNORMAL HIGH (ref 0.44–1.00)
GFR calc Af Amer: 17 mL/min — ABNORMAL LOW (ref >60)
GFR calc non Af Amer: 15 mL/min — ABNORMAL LOW (ref >60)
Glucose, Bld: 123 mg/dL — ABNORMAL HIGH (ref 70–99)
Potassium: 3.9 mmol/L (ref 3.5–5.1)
Sodium: 140 mmol/L (ref 135–145)

## 2019-09-21 LAB — TROPONIN I (HIGH SENSITIVITY)
Troponin I (High Sensitivity): 81 ng/L — ABNORMAL HIGH (ref <18)
Troponin I (High Sensitivity): 109 ng/L (ref <18)
Troponin I (High Sensitivity): 114 ng/L (ref <18)

## 2019-09-21 LAB — PREPARE RBC (CROSSMATCH)

## 2019-09-21 LAB — MAGNESIUM: Magnesium: 2.4 mg/dL (ref 1.7–2.4)

## 2019-09-21 LAB — LEGIONELLA PNEUMOPHILA SEROGP 1 UR AG: L. pneumophila Serogp 1 Ur Ag: NEGATIVE

## 2019-09-21 LAB — PROTIME-INR
INR: 1.2 (ref 0.8–1.2)
Prothrombin Time: 15.1 seconds (ref 11.4–15.2)

## 2019-09-21 LAB — HEMOGLOBIN AND HEMATOCRIT, BLOOD
HCT: 23.7 % — ABNORMAL LOW (ref 36.0–46.0)
Hemoglobin: 8 g/dL — ABNORMAL LOW (ref 12.0–15.0)

## 2019-09-21 LAB — APTT: aPTT: 45 seconds — ABNORMAL HIGH (ref 24–36)

## 2019-09-21 LAB — GLUCOSE, CAPILLARY
Glucose-Capillary: 118 mg/dL — ABNORMAL HIGH (ref 70–99)
Glucose-Capillary: 120 mg/dL — ABNORMAL HIGH (ref 70–99)
Glucose-Capillary: 125 mg/dL — ABNORMAL HIGH (ref 70–99)
Glucose-Capillary: 84 mg/dL (ref 70–99)
Glucose-Capillary: 87 mg/dL (ref 70–99)

## 2019-09-21 MED ORDER — HYDROCODONE-ACETAMINOPHEN 7.5-325 MG PO TABS
1.0000 | ORAL_TABLET | Freq: Three times a day (TID) | ORAL | Status: DC | PRN
  Administered 2019-09-22 – 2019-09-29 (×4): 1 via ORAL
  Filled 2019-09-21 (×4): qty 1

## 2019-09-21 MED ORDER — FENTANYL CITRATE (PF) 100 MCG/2ML IJ SOLN
12.5000 ug | Freq: Once | INTRAMUSCULAR | Status: AC
  Administered 2019-09-21: 12.5 ug via INTRAVENOUS
  Filled 2019-09-21: qty 2

## 2019-09-21 MED ORDER — HEPARIN (PORCINE) 25000 UT/250ML-% IV SOLN
700.0000 [IU]/h | INTRAVENOUS | Status: DC
  Administered 2019-09-21: 700 [IU]/h via INTRAVENOUS
  Filled 2019-09-21: qty 250

## 2019-09-21 MED ORDER — SODIUM CHLORIDE 0.9% IV SOLUTION
Freq: Once | INTRAVENOUS | Status: AC
  Administered 2019-09-21: 10:00:00 via INTRAVENOUS

## 2019-09-21 MED ORDER — ORAL CARE MOUTH RINSE
15.0000 mL | Freq: Two times a day (BID) | OROMUCOSAL | Status: DC
  Administered 2019-09-22 – 2019-09-30 (×14): 15 mL via OROMUCOSAL

## 2019-09-21 MED ORDER — HEPARIN BOLUS VIA INFUSION
3500.0000 [IU] | Freq: Once | INTRAVENOUS | Status: AC
  Administered 2019-09-21: 3500 [IU] via INTRAVENOUS
  Filled 2019-09-21: qty 3500

## 2019-09-21 MED ORDER — NITROGLYCERIN IN D5W 200-5 MCG/ML-% IV SOLN
INTRAVENOUS | Status: AC
  Administered 2019-09-21: 50 mg via INTRAVENOUS
  Filled 2019-09-21: qty 250

## 2019-09-21 MED ORDER — NITROGLYCERIN IN D5W 200-5 MCG/ML-% IV SOLN
0.0000 ug/min | INTRAVENOUS | Status: DC
  Administered 2019-09-21: 09:00:00 50 mg via INTRAVENOUS
  Administered 2019-09-21: 6.667 ug/min via INTRAVENOUS
  Administered 2019-09-22: 60 ug/min via INTRAVENOUS
  Administered 2019-09-22: 70 ug/min via INTRAVENOUS
  Filled 2019-09-21 (×3): qty 250

## 2019-09-21 MED ORDER — ASPIRIN 300 MG RE SUPP
300.0000 mg | Freq: Every day | RECTAL | Status: DC
  Administered 2019-09-21: 300 mg via RECTAL
  Filled 2019-09-21: qty 1

## 2019-09-21 NOTE — Progress Notes (Signed)
HD Tx started w/o complication    [id number]  Vital Signs  Temp 99.5 F (37.5 C)  Pulse Rate 83  Resp 20  BP (!) 112/57  Oxygen Therapy  SpO2 99 %  During Hemodialysis Assessment  Blood Flow Rate (mL/min) 400 mL/min  Arterial Pressure (mmHg) -170 mmHg  Venous Pressure (mmHg) 140 mmHg  Transmembrane Pressure (mmHg) 60 mmHg  Ultrafiltration Rate (mL/min) 1000 mL/min  Dialysate Flow Rate (mL/min) 600 ml/min  Conductivity: Machine  15.5  HD Safety Checks Performed Yes  Dialysis Fluid Bolus Normal Saline  Bolus Amount (mL) 250 mL  Intra-Hemodialysis Comments Tx initiated  Fistula / Graft Left Forearm Arteriovenous fistula  No Placement Date or Time found.   Placed prior to admission: Yes  Orientation: Left  Access Location: Forearm  Access Type: Arteriovenous fistula  Site Condition No complications  Fistula / Graft Assessment Present;Thrill;Bruit  Status Accessed  Needle Size 15 g  Drainage Description None

## 2019-09-21 NOTE — Progress Notes (Signed)
Pre HD Tx   09/21/19 1129  Hand-Off documentation  Report given to (Full Name) Beatris Ship, RN   Report received from (Full Name) Britt Boozer, RN   Vital Signs  Temp 99.5 F (37.5 C)  Temp Source Bladder  Pulse Rate 83  Pulse Rate Source Monitor  Resp 19  BP (!) 109/49  BP Location Right Leg  BP Method Automatic  Patient Position (if appropriate) Lying  Oxygen Therapy  SpO2 100 %  O2 Device Bi-PAP  Pulse Oximetry Type Continuous  Pain Assessment  Pain Scale 0-10  Pain Score 0  Dialysis Weight  Weight 59.2 kg  Type of Weight Pre-Dialysis  Time-Out for Hemodialysis  What Procedure? HD  Pt Identifiers(min of two) First/Last Name;MRN/Account#  Correct Site? Yes  Correct Side? Yes  Correct Procedure? Yes  Consents Verified? Yes  Rad Studies Available? N/A  Safety Precautions Reviewed? Yes  Engineer, civil (consulting) Number 2  Station Number  (Bedside ICU 3 )  UF/Alarm Test Passed  Conductivity: Meter 14  Conductivity: Machine  14.2  pH 7.2  Reverse Osmosis Portable (WRO#3)  Normal Saline Lot Number [id number]  Dialyzer Lot Number [id number]  Machine Temperature 98.6 F (37 C)  Musician and Audible Yes  Blood Lines Intact and Secured Yes  Pre Treatment Patient Checks  Vascular access used during treatment Fistula  Hepatitis B Surface Antigen Results Negative  Date Hepatitis B Surface Antigen Drawn 09/06/19  Hepatitis B Surface Antibody  (>10)  Date Hepatitis B Surface Antibody Drawn 11/29/18  Hemodialysis Consent Verified Yes  Hemodialysis Standing Orders Initiated Yes  ECG (Telemetry) Monitor On Yes  Prime Ordered Normal Saline  Length of  DialysisTreatment -hour(s) 3.5 Hour(s)  Dialysis Treatment Comments Na 140  Dialyzer Elisio 17H NR  Dialysate 3K;2.5 Ca  Dialysis Anticoagulant None  Dialysate Flow Ordered 600  Blood Flow Rate Ordered 400 mL/min  Ultrafiltration Goal 2 Liters  Blood Products Ordered Packed Red Blood Cells  Dialysis Blood  Pressure Support Ordered Normal Saline  Education / Care Plan  Dialysis Education Provided Yes  Documented Education in Care Plan Yes  Fistula / Graft Left Forearm Arteriovenous fistula  No Placement Date or Time found.   Placed prior to admission: Yes  Orientation: Left  Access Location: Forearm  Access Type: Arteriovenous fistula  Site Condition No complications  Fistula / Graft Assessment Present;Thrill;Bruit  Status Patent  Drainage Description None

## 2019-09-21 NOTE — Progress Notes (Signed)
Follow up - Critical Care Medicine Note  Patient Details:    Phyllis [name] is an 73 y.o. female lifelong never smoker with ESRD on hemodialysis Tuesday Thursday Saturday and history as noted below presented with acute hypoxic respiratory failure due to volume overload.    Patient was admitted to the ICU intubated and mechanically ventilated.  Emergent dialysis ensued.  Lines, Airways, Drains: Urethral Catheter Kandis Nab, RN Latex 16 Fr. (Active)  Indication for Insertion or Continuance of Catheter Unstable critically ill patients first 24-48 hours (See Criteria) 09/21/19 1209  Site Assessment Clean;Intact 09/21/19 1209  Catheter Maintenance Bag below level of bladder;Drainage bag/tubing not touching floor;No dependent loops 09/21/19 1209  Collection Container Standard drainage bag 09/21/19 1209  Securement Method Securing device (Describe) 09/21/19 1209  Urinary Catheter Interventions (if applicable) Unclamped [id number] 1209  Output (mL) 75 mL 09/21/19 0500    Anti-infectives:  Anti-infectives (From admission, onward)   Start     Dose/Rate Route Frequency Ordered Stop   09/21/19 1000  cefTRIAXone (ROCEPHIN) 2 g in sodium chloride 0.9 % 100 mL IVPB  Status:  Discontinued     2 g 200 mL/hr over 30 Minutes Intravenous Every 24 hours 09/20/19 0833 09/21/19 0936   09/21/19 1000  azithromycin (ZITHROMAX) 500 mg in sodium chloride 0.9 % 250 mL IVPB  Status:  Discontinued     500 mg 250 mL/hr over 60 Minutes Intravenous Every 24 hours 09/20/19 0833 09/21/19 0936   09/20/19 0700  cefTRIAXone (ROCEPHIN) 2 g in sodium chloride 0.9 % 100 mL IVPB  Status:  Discontinued     2 g 200 mL/hr over 30 Minutes Intravenous Every 24 hours 09/20/19 0650 09/20/19 0958   09/20/19 0700  azithromycin (ZITHROMAX) 500 mg in sodium chloride 0.9 % 250 mL IVPB  Status:  Discontinued     500 mg 250 mL/hr over 60 Minutes Intravenous Every 24 hours 09/20/19 0650 09/20/19 0957      Microbiology: Results for orders  placed or performed during the hospital encounter of 09/20/19  Culture, blood (routine x 2)     Status: None (Preliminary result)   Collection Time: 09/20/19  5:26 AM   Specimen: BLOOD  Result Value Ref Range Status   Specimen Description BLOOD RIGHT FOREARM  Final   Special Requests   Final    BOTTLES DRAWN AEROBIC AND ANAEROBIC Blood Culture adequate volume   Culture   Final    NO GROWTH 1 DAY Performed at [facility], [address] [place], [place] [address]    Report Status PENDING  Incomplete  Culture, blood (routine x 2)     Status: None (Preliminary result)   Collection Time: 09/20/19  5:26 AM   Specimen: BLOOD  Result Value Ref Range Status   Specimen Description BLOOD RIGHT HAND  Final   Special Requests   Final    BOTTLES DRAWN AEROBIC AND ANAEROBIC Blood Culture adequate volume   Culture   Final    NO GROWTH 1 DAY Performed at [facility], [address], [place], [place] [address]    Report Status PENDING  Incomplete  SARS Coronavirus 2 by RT PCR (hospital order, performed in [place] hospital lab) Nasopharyngeal Nasopharyngeal Swab     Status: None   Collection Time: 09/20/19  5:26 AM   Specimen: Nasopharyngeal Swab  Result Value Ref Range Status   SARS Coronavirus 2 NEGATIVE NEGATIVE Final    Comment: (NOTE) If result is NEGATIVE SARS-CoV-2 target nucleic acids are  NOT DETECTED. The SARS-CoV-2 RNA is generally detectable in upper and lower  respiratory specimens during the acute phase of infection. The lowest  concentration of SARS-CoV-2 viral copies this assay can detect is 250  copies / mL. A negative result does not preclude SARS-CoV-2 infection  and should not be used as the sole basis for treatment or other  patient management decisions.  A negative result may occur with  improper specimen collection / handling, submission of specimen other  than nasopharyngeal swab, presence of viral mutation(s) within the  areas targeted by this  assay, and inadequate number of viral copies  (<250 copies / mL). A negative result must be combined with clinical  observations, patient history, and epidemiological information. If result is POSITIVE SARS-CoV-2 target nucleic acids are DETECTED. The SARS-CoV-2 RNA is generally detectable in upper and lower  respiratory specimens dur ing the acute phase of infection.  Positive  results are indicative of active infection with SARS-CoV-2.  Clinical  correlation with patient history and other diagnostic information is  necessary to determine patient infection status.  Positive results do  not rule out bacterial infection or co-infection with other viruses. If result is PRESUMPTIVE POSTIVE SARS-CoV-2 nucleic acids MAY BE PRESENT.   A presumptive positive result was obtained on the submitted specimen  and confirmed on repeat testing.  While 2019 novel coronavirus  (SARS-CoV-2) nucleic acids may be present in the submitted sample  additional confirmatory testing may be necessary for epidemiological  and / or clinical management purposes  to differentiate between  SARS-CoV-2 and other Sarbecovirus currently known to infect humans.  If clinically indicated additional testing with an alternate test  methodology [phone]) is advised. The SARS-CoV-2 RNA is generally  detectable in upper and lower respiratory sp ecimens during the acute  phase of infection. The expected result is Negative. Fact Sheet for Patients:  StrictlyIdeas.no Fact Sheet for Healthcare Providers: BankingDealers.co.za This test is not yet approved or cleared by the [place] FDA and has been authorized for detection and/or diagnosis of SARS-CoV-2 by FDA under an Emergency Use Authorization (EUA).  This EUA will remain in effect (meaning this test can be used) for the duration of the COVID-19 declaration under Section 564(b)(1) of the Act, 21 U.S.C. section 360bbb-3(b)(1),  unless the authorization is terminated or revoked sooner. Performed at [facility], [address] [place], [place] [address]   MRSA PCR Screening     Status: None   Collection Time: 09/20/19 10:12 AM   Specimen: Nasopharyngeal  Result Value Ref Range Status   MRSA by PCR NEGATIVE NEGATIVE Final    Comment:        The GeneXpert MRSA Assay (FDA approved for NASAL specimens only), is one component of a comprehensive MRSA colonization surveillance program. It is not intended to diagnose MRSA infection nor to guide or monitor treatment for MRSA infections. Performed at [facility], [address] [place], [place] [address]     Best Practice/Protocols:  VTE Prophylaxis: Heparin (SQ) GI Prophylaxis: Proton Pump Inhibitor Ventilator weaning  Events: 10/31 admitted to ICU with volume overload, intubated, mechanically ventilated, emergent dialysis done. 11/01 SAT passed.  SBT passed.  Extubated   Studies: Dg Abd 1 View  Result Date: 09/20/2019 CLINICAL DATA:  OG tube placement EXAM: ABDOMEN - 1 VIEW COMPARISON:  None. FINDINGS: OG tube appears adequately positioned in the stomach with proximal sideholes just below the level of the gastroesophageal junction. Visualized bowel gas pattern is nonobstructive. No evidence  of free intraperitoneal air or abnormal fluid collection. Aortic atherosclerosis. IMPRESSION: 1. OG tube appears adequately positioned in the stomach with proximal sideholes just below the level of the gastroesophageal junction. 2. Nonobstructive bowel gas pattern. 3. Aortic atherosclerosis. Electronically Signed   By: Franki Cabot M.D.   On: 09/20/2019 11:52   Ct Chest Wo Contrast  Result Date: 09/19/2019 CLINICAL DATA:  Midsternal chest pain for 2 weeks with shortness of breath. Nonsmoker without history of cancer. EXAM: CT CHEST WITHOUT CONTRAST TECHNIQUE: Multidetector CT imaging of the chest was performed following the standard protocol  without IV contrast. COMPARISON:  09/05/2019 chest radiograph. Most recent CT of 09/02/2008 FINDINGS: Cardiovascular: Advanced aortic and branch vessel atherosclerosis. Tortuous thoracic aorta. Mild cardiomegaly, without pericardial effusion. Multivessel coronary artery atherosclerosis. Aortic valve calcification. Mediastinum/Nodes: Hypoattenuating left thyroid nodule of 1.2 cm, new but nonspecific. No mediastinal or definite hilar adenopathy, given limitations of unenhanced CT. Lungs/Pleura: No pleural fluid.  Clear lungs. Upper Abdomen: Nonspecific caudate lobe enlargement. Similar. Normal imaged portions of the spleen, adrenal glands. Renal atrophy bilaterally. An upper pole left renal exophytic 1.8 cm cyst. Pancreatic tail 1.8 cm lesion on 114/2 is minimally enlarged compared to 2009 where it measured 1.5 cm. Calcification in its inferior portion. There is also a incompletely imaged pancreatic body lesion on 125/2, on the order of 1.8 cm-similar. Suggestion of peripancreatic inflammation, especially about the pancreatic tail lesion on 113/2. Cholecystectomy. Musculoskeletal: Lower thoracic spondylosis. No sternal fracture or retrosternal fluid. IMPRESSION: 1.  No acute process in the chest. 2. Coronary artery atherosclerosis. Aortic Atherosclerosis (ICD10-I70.0). 3. Pancreatic cystic lesions, suboptimally and incompletely evaluated. Suspicion of peripancreatic edema. Correlate with symptoms to suggest acute on chronic pancreatitis. Consider follow-up with dedicated MRI or CT. 4. Chronic caudate lobe prominence, nonspecific. Correlate with any risk factors for cirrhosis. Electronically Signed   By: Abigail Miyamoto M.D.   On: 09/19/2019 12:28   Dg Chest Port 1 View  Result Date: 09/20/2019 CLINICAL DATA:  Intubation EXAM: PORTABLE CHEST 1 VIEW COMPARISON:  09/20/2019, 5:50 a.m. FINDINGS: Interval placement of endotracheal tube, tip positioned over the lower trachea, approximately 2 cm above the carina.  Esophagogastric tube is position with tip and side port below the diaphragm. Mild cardiomegaly. Interval improvement in bilateral heterogeneous and interstitial opacity, most conspicuous in the right midlung. IMPRESSION: 1. Interval placement of endotracheal tube, tip positioned over the lower trachea, approximately 2 cm above the carina. Esophagogastric tube is position with tip and side port below the diaphragm. 2. Interval improvement in bilateral heterogeneous and interstitial opacity, most conspicuous in the right midlung, likely improved edema. Electronically Signed   By: Eddie Candle M.D.   On: 09/20/2019 11:20   Dg Chest Port 1 View  Result Date: 09/20/2019 CLINICAL DATA:  Intubated. Respiratory distress. EXAM: PORTABLE CHEST 1 VIEW COMPARISON:  09/05/2019 FINDINGS: The endotracheal tube tip is at the thoracic inlet. Borderline enlarged cardiac silhouette. Tortuous and calcified thoracic aorta. Extensive patchy bilateral airspace opacity, greater on the right. No pleural fluid. Thoracic spine degenerative changes. IMPRESSION: 1. Endotracheal tube tip at the thoracic inlet. It is recommended that this be advanced 4.3 cm. 2. Extensive patchy bilateral probable pneumonia. 3. Aortic atherosclerosis. Electronically Signed   By: Claudie Revering M.D.   On: 09/20/2019 06:41   Dg Chest Port 1 View  Result Date: 09/05/2019 CLINICAL DATA:  Shortness of breath EXAM: PORTABLE CHEST 1 VIEW COMPARISON:  05/23/2019 FINDINGS: Tortuosity of the thoracic aorta with diffuse calcifications. Aorta is normal size.  Low lung volumes with mild peribronchial thickening and interstitial prominence. No effusions. No acute bony abnormality. IMPRESSION: Mild peribronchial thickening and interstitial prominence may reflect bronchitic changes. Electronically Signed   By: Rolm Baptise M.D.   On: 09/05/2019 17:12    Consults: Treatment Team:  Minna Merritts, MD  Nephrology, Dr. Candiss Norse  Subjective:    Overnight Issues:  Tolerated dialysis well.  This morning he is awake alert and following commands.  Passing SBT.  Plan for extubation.  Objective:  Vital signs for last 24 hours: Temp:  [98.1 F (36.7 C)-100.2 F (37.9 C)] 99.7 F (37.6 C) (11/01 1500) Pulse Rate:  [64-109] 89 (11/01 1500) Resp:  [15-30] 22 (11/01 1500) BP: (85-166)/(31-75) 125/49 (11/01 1500) SpO2:  [97 %-100 %] 98 % (11/01 1500) FiO2 (%):  [30 %] 30 % (11/01 1209) Weight:  [59.2 kg] 59.2 kg (11/01 1129)  Hemodynamic parameters for last 24 hours:    Intake/Output from previous day: 10/31 0701 - 11/01 0700 In: 2626.9 [I.V.:466.9; NG/GT:60; IV Piggyback:2100] Out: 1976 [Urine:175; Stool:1]  Intake/Output this shift: Total I/O In: 834.4 [I.V.:471.4; Blood:363] Out: -   Vent settings for last 24 hours: Vent Mode: Spontaneous FiO2 (%):  [30 %] 30 % Set Rate:  [16 bmp] 16 bmp Vt Set:  [420 mL] 420 mL PEEP:  [5 cmH20] 5 cmH20 Plateau Pressure:  [12 cmH20-18 cmH20] 18 cmH20  Physical Exam:  Constitutional: She appears well-developed and well-nourished. She appears ill. She is  awake and following commands  she is intubated.  Chronically ill appearing  HENT:  Head: Normocephalic and atraumatic.  Mouth/Throat: Mucous membranes are pale.  Orotracheally intubated.  Eyes: PERRL. Conjunctivae are normal. No scleral icterus.  Neck: Neck supple. No tracheal deviation present. Cardiovascular: Normal rate and regular rhythm.  Murmur heard.  Systolic murmur is present with a grade of 3/6.  More audible today. AS murmur. AV fistula on L forearm with good palpable thrill.  Respiratory: She is intubated. She has no wheezes.  Clear lung fields  GI: Soft. Bowel sounds are normal. She exhibits no distension.  Musculoskeletal: Normal range of motion.        General: No edema.  Neurological:   Awakens easily, follows commands, no overt focal deficit. Skin: Skin is warm and dry. There is pallor.  Psychiatric:   Intubated cannot assess.    Assessment/Plan:   1.  Acute hypoxic respiratory failure:  This is due to volume overload.  She has diastolic dysfunction and aortic stenosis classified as moderate by her last echo.  She has done well after dialysis and will proceed with extubation today.   2.  Acute on chronic diastolic heart failure: BNP 1, 516; likely cause of decompensation, she also has aortic stenosis which adds complexity to this issue.  Dialysis as needed.  She has seen Dr. Jacqulyn Cane in the past, will have cardiology reevaluate.  3.  End-stage renal disease:  Dialysis yesterday as per Dr. Candiss Norse  4.  Lactic acidosis due to hypoxemia: Resolved.  5.  Anemia of chronic kidney disease: H&H lower today at 7.3/21.5 we will transfuse 1 unit.  No evidence of bleeding.  May be dilutional due to volume overload  Best practice: GI prophylaxis with PPI, DVT prophylaxis with Heparin subcu. Ventilator discomfort: Propofol discontinued as is to be extubated.     LOS: 1 day   Additional comments: Discussed with Dr. Candiss Norse, discussed with Dr. Rockey Situ.  Critical Care Total Time*: 45 Minutes    C. Mickel Baas  Patsey Berthold, MD [place] 09/21/2019  *Care during the described time interval was provided by me and/or other providers on the critical care team.  I have reviewed this patient's available data, including medical history, events of note, physical examination and test results as part of my evaluation.

## 2019-09-21 NOTE — Therapy (Signed)
Extubated to 2L with RT, RN and Dr. Patsey Berthold at bedside.  Pt. Is doing well.  100% on 2L, HR=88% RR=22.  Pt. Is calm and doing well.

## 2019-09-21 NOTE — Progress Notes (Signed)
Patient extubated at 8 am to 2L nasal cannula. At 9am, patient complained of chest pain. ICU MD notified. Received a one time order for fentanyl IV. Then, patient had increased respirations and patient complained of SOB. Listened to lung sounds and heard crackles on the top of the lungs. ICU MD notified. Patient placed on Bipap via RT. Nitro drip started. Aspirin given rectally. Heparin drip started. ICU MD consulted nephrology and cardiology.

## 2019-09-21 NOTE — Progress Notes (Signed)
Extubated onto 2L [place]

## 2019-09-21 NOTE — Progress Notes (Signed)
HD Tx completed tolerated well   09/21/19 1500  Vital Signs  Temp 99.7 F (37.6 C)  Pulse Rate 89  Pulse Rate Source Monitor  Resp (!) 22  BP (!) 125/49  BP Location Right Arm  BP Method Automatic  Patient Position (if appropriate) Lying  Oxygen Therapy  SpO2 98 %  O2 Device HFNC  O2 Flow Rate (L/min) 30 L/min  Pulse Oximetry Type Continuous  During Hemodialysis Assessment  HD Safety Checks Performed Yes  KECN 65.8 KECN  Dialysis Fluid Bolus Normal Saline  Bolus Amount (mL) 250 mL  Intra-Hemodialysis Comments Tx completed;Tolerated well

## 2019-09-21 NOTE — Progress Notes (Signed)
Post HD Tx    09/21/19 1508  Hand-Off documentation  Report given to (Full Name) Britt Boozer, RN   Report received from (Full Name) Beatris Ship, Rn   Vital Signs  Temp 99.9 F (37.7 C)  Temp Source Bladder  Pulse Rate 88  Pulse Rate Source Monitor  Resp 20  BP (!) 119/43  BP Location Right Arm  BP Method Automatic  Patient Position (if appropriate) Lying  Oxygen Therapy  SpO2 97 %  O2 Device HFNC  O2 Flow Rate (L/min) 30 L/min  Pulse Oximetry Type Continuous  Pain Assessment  Pain Scale 0-10  Pain Score 0  Dialysis Weight  Weight 57 kg  Type of Weight Post-Dialysis  Post-Hemodialysis Assessment  Rinseback Volume (mL) 250 mL  KECN 65.8 V  Dialyzer Clearance Lightly streaked  Duration of HD Treatment -hour(s) 3.5 hour(s)  Hemodialysis Intake (mL) 500 mL  UF Total -Machine (mL) 2500 mL  Net UF (mL) 2000 mL  AVG/AVF Arterial Site Held (minutes) 5 minutes  AVG/AVF Venous Site Held (minutes) 5 minutes  Fistula / Graft Left Forearm Arteriovenous fistula  No Placement Date or Time found.   Placed prior to admission: Yes  Orientation: Left  Access Location: Forearm  Access Type: Arteriovenous fistula  Site Condition No complications  Fistula / Graft Assessment Present;Thrill;Bruit  Status Deaccessed  Drainage Description None

## 2019-09-21 NOTE — Progress Notes (Signed)
ANTICOAGULATION CONSULT NOTE - Initial Consult  Pharmacy Consult for Heparin Indication: chest pain/ACS  Allergies  Allergen Reactions  . Enalapril Maleate Other (See Comments)    Other reaction(s): Headache  . Nitrofurantoin Swelling and Rash    Other Reaction: swelling of body  . Sulfamethoxazole-Trimethoprim Swelling  . 2,4-D Dimethylamine (Amisol) Rash and Other (See Comments)    Other Reaction: h/a  . Baclofen Other (See Comments) and Nausea Only    lightheadness ,drowsiness , muscle weakness , twitching in hands   . Neosporin [Neomycin-Bacitracin Zn-Polymyx] Other (See Comments) and Rash    Other Reaction: irritation Skin irritation   . Quinine Nausea And Vomiting, Rash and Other (See Comments)    Other Reaction: Vomiting, rash, h/a, vision  . Ultram [Tramadol] Palpitations  . Zocor [Simvastatin] Other (See Comments) and Rash    Other Reaction: muscle spasms Muscle pain and spasms  . Bactrim [Sulfamethoxazole-Trimethoprim] Swelling  . Levodopa Other (See Comments)    Reaction: unknown  . Macrodantin [Nitrofurantoin Macrocrystal] Swelling  . Quinine Derivatives Other (See Comments)    Vertigo,nausea vomiting blurred vision headache ears sensitivity     Patient Measurements: Height: 5\' 2"  (157.5 cm) Weight: 130 lb 8.2 oz (59.2 kg) IBW/kg (Calculated) : 50.1 Heparin Dosing Weight: 59.2 kg  Vital Signs: Temp: 99.7 F (37.6 C) (11/01 0900) Temp Source: Bladder (11/01 0733) BP: 154/66 (11/01 0900) Pulse Rate: 109 (11/01 0900)  Labs: Recent Labs    09/20/19 0526 09/20/19 0756 09/21/19 0435  HGB 7.9*  --  7.3*  HCT 24.5*  --  21.5*  PLT 206  --  126*  CREATININE 6.54*  --  3.05*  TROPONINIHS 41* 32*  --     Estimated Creatinine Clearance: 13 mL/min (A) (by C-G formula based on SCr of 3.05 mg/dL (H)).   Medical History: Past Medical History:  Diagnosis Date  . (HFpEF) heart failure with preserved ejection fraction ([place])    a. TTE 01/2014: nl LV sys fxn,  no valvular abnormalities; b. TTE 11/16: nl EF, mild LVH;  c. 04/2019 Echo: EF 60-65%. DD. Nl RV fxn. Mod AS. 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  . Allergy   . Anemia of chronic disease   . Anxiety   . Aortic atherosclerosis ([place])   . Asthma   . Chronic back pain   . COPD (chronic obstructive pulmonary disease) ([place])   . Diabetes mellitus with complication ([place])   . ESRD on hemodialysis ([place])    a. Tues/Sat; b. 2/2 small kidneys  . Essential hypertension   . Fistula    lower left arm  . GERD (gastroesophageal reflux disease)   . Gout   . History of exercise stress test    a. 01/2014: no evidence of ischemia; b. Lexiscan 08/2015: no sig ischemia, severe GI uptake artifact, low risk; c. CPET @ [facility] 09/2016: exercised 3 min 12 sec on bike without incline, 2.28 METs, VO2 of 8.1, 48% of predicted, indicating mod to sev functional impairment, evidence of blunted HR, stroke volume, and BP augmentation as well as ventilation-perfusion mismatch with exercise  . HLD (hyperlipidemia)   . Moderate aortic stenosis    a. 04/2019 Echo: Mod AS.  . Non-obstructive Carotid arterial disease ([place])    a. 12/2017: <50% bilat ICA dzs.  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 Sleep apnea    Assessment: Patient is a 73yo female admitted with acute respiratory failure, was mechanically ventilated. Extubated this  morning and went into respiratory distress and placed on BiPAP. Patient is ESRD on HD. Pharmacy consulted to manage Heparin for possible STEMI.  Goal of Therapy:  Heparin level 0.3-0.7 units/ml Monitor platelets by anticoagulation protocol: Yes   Plan:  Give 3500 units bolus x 1 Start heparin infusion at 700 units/hr Check anti-Xa level in 8 hours and daily while on heparin Continue to monitor H&H and platelets  Paulina Fusi, PharmD, BCPS 09/21/2019 10:18 AM

## 2019-09-21 NOTE — Progress Notes (Signed)
[facility], [place] 09/21/19  Subjective:   LOS: 1 10/31 0701 - 11/01 0700 In: 2626.9 [I.V.:466.9; NG/GT:60; IV Piggyback:2100] Out: 1976 [Urine:175; Stool:1]  Patient known to our practice from outpatient dialysis.  She arrives via EMS for complaints of respiratory distress.  She had low saturation of 60% on room air was administered DuoNeb by EMS.  Patient became fatigued.  She was intubated for respiratory distress. Underwent emergent HD on 10/31. 1800 cc was removed  This morning she was extubated but soon after she went into resp distress and had to be placed on BipAP. Urgent HD requested by ICU team  Able to follow simple commands    Objective:  Vital signs in last 24 hours:  Temp:  [96.4 F (35.8 C)-100.2 F (37.9 C)] 99 F (37.2 C) (11/01 0800) Pulse Rate:  [64-97] 97 (11/01 0800) Resp:  [9-30] 30 (11/01 0800) BP: (110-166)/(41-73) 147/73 (11/01 0800) SpO2:  [100 %] 100 % (11/01 0800) FiO2 (%):  [30 %] 30 % (11/01 0758) Weight:  [59.2 kg-63.9 kg] 59.2 kg (11/01 0500)  Weight change: 2.664 kg Filed Weights   09/20/19 1009 09/20/19 1510 09/21/19 0500  Weight: 63.9 kg 63.9 kg 59.2 kg    Intake/Output:    Intake/Output Summary (Last 24 hours) at 09/21/2019 0932 Last data filed at 09/21/2019 [id number] Gross per 24 hour  Intake 533.61 ml  Output 1976 ml  Net -1442.39 ml     Physical Exam: General: Critically ill appearing  HEENT Anicteric, BiPAP mask in place  Pulm/lungs Coarse crackles b/l,  PPV  CVS/Heart Regular tachycardic  Abdomen:  Soft, non distended  Extremities: No edema  Neurologic: Alert, able to follow simple commands  Skin: No acute rashes, warm dry  Access: Left forearm AVF       Basic Metabolic Panel:  Recent Labs  Lab 09/20/19 0526 09/21/19 0435 09/21/19 0441  NA 138 140  --   K 3.7 3.9  --   CL 99 97*  --   CO2 25 26  --   GLUCOSE 190* 123*  --   BUN 49* 18  --   CREATININE 6.54* 3.05*  --   CALCIUM  7.8* 8.4*  --   MG  --   --  2.4     CBC: Recent Labs  Lab 09/20/19 0526 09/21/19 0435  WBC 15.1* 9.3  NEUTROABS 12.3*  --   HGB 7.9* 7.3*  HCT 24.5* 21.5*  MCV 92.5 88.1  PLT 206 126*      Lab Results  Component Value Date   HEPBSAG NON REACTIVE 09/06/2019      Microbiology:  Recent Results (from the past 240 hour(s))  Culture, blood (routine x 2)     Status: None (Preliminary result)   Collection Time: 09/20/19  5:26 AM   Specimen: BLOOD  Result Value Ref Range Status   Specimen Description BLOOD RIGHT FOREARM  Final   Special Requests   Final    BOTTLES DRAWN AEROBIC AND ANAEROBIC Blood Culture adequate volume   Culture   Final    NO GROWTH 1 DAY Performed at [facility], [address] [place], [place] [address]    Report Status PENDING  Incomplete  Culture, blood (routine x 2)     Status: None (Preliminary result)   Collection Time: 09/20/19  5:26 AM   Specimen: BLOOD  Result Value Ref Range Status   Specimen Description BLOOD RIGHT HAND  Final   Special Requests  Final    BOTTLES DRAWN AEROBIC AND ANAEROBIC Blood Culture adequate volume   Culture   Final    NO GROWTH 1 DAY Performed at [facility], [address] [place], [place] [address]    Report Status PENDING  Incomplete  SARS Coronavirus 2 by RT PCR (hospital order, performed in [facility] hospital lab) Nasopharyngeal Nasopharyngeal Swab     Status: None   Collection Time: 09/20/19  5:26 AM   Specimen: Nasopharyngeal Swab  Result Value Ref Range Status   SARS Coronavirus 2 NEGATIVE NEGATIVE Final    Comment: (NOTE) If result is NEGATIVE SARS-CoV-2 target nucleic acids are NOT DETECTED. The SARS-CoV-2 RNA is generally detectable in upper and lower  respiratory specimens during the acute phase of infection. The lowest  concentration of SARS-CoV-2 viral copies this assay can detect is 250  copies / mL. A negative result does not preclude SARS-CoV-2 infection  and  should not be used as the sole basis for treatment or other  patient management decisions.  A negative result may occur with  improper specimen collection / handling, submission of specimen other  than nasopharyngeal swab, presence of viral mutation(s) within the  areas targeted by this assay, and inadequate number of viral copies  (<250 copies / mL). A negative result must be combined with clinical  observations, patient history, and epidemiological information. If result is POSITIVE SARS-CoV-2 target nucleic acids are DETECTED. The SARS-CoV-2 RNA is generally detectable in upper and lower  respiratory specimens dur ing the acute phase of infection.  Positive  results are indicative of active infection with SARS-CoV-2.  Clinical  correlation with patient history and other diagnostic information is  necessary to determine patient infection status.  Positive results do  not rule out bacterial infection or co-infection with other viruses. If result is PRESUMPTIVE POSTIVE SARS-CoV-2 nucleic acids MAY BE PRESENT.   A presumptive positive result was obtained on the submitted specimen  and confirmed on repeat testing.  While 2019 novel coronavirus  (SARS-CoV-2) nucleic acids may be present in the submitted sample  additional confirmatory testing may be necessary for epidemiological  and / or clinical management purposes  to differentiate between  SARS-CoV-2 and other Sarbecovirus currently known to infect humans.  If clinically indicated additional testing with an alternate test  methodology [phone]) is advised. The SARS-CoV-2 RNA is generally  detectable in upper and lower respiratory sp ecimens during the acute  phase of infection. The expected result is Negative. Fact Sheet for Patients:  StrictlyIdeas.no Fact Sheet for Healthcare Providers: BankingDealers.co.za This test is not yet approved or cleared by the [place] FDA and has been  authorized for detection and/or diagnosis of SARS-CoV-2 by FDA under an Emergency Use Authorization (EUA).  This EUA will remain in effect (meaning this test can be used) for the duration of the COVID-19 declaration under Section 564(b)(1) of the Act, 21 U.S.C. section 360bbb-3(b)(1), unless the authorization is terminated or revoked sooner. Performed at [facility], [address] [place], [place] [address]   MRSA PCR Screening     Status: None   Collection Time: 09/20/19 10:12 AM   Specimen: Nasopharyngeal  Result Value Ref Range Status   MRSA by PCR NEGATIVE NEGATIVE Final    Comment:        The GeneXpert MRSA Assay (FDA approved for NASAL specimens only), is one component of a comprehensive MRSA colonization surveillance program. It is not intended to diagnose MRSA infection nor to guide or  monitor treatment for MRSA infections. Performed at [facility], [address] [place], [place] [address]     Coagulation Studies: No results for input(s): LABPROT, INR in the last 72 hours.  Urinalysis: No results for input(s): COLORURINE, LABSPEC, PHURINE, GLUCOSEU, HGBUR, BILIRUBINUR, KETONESUR, PROTEINUR, UROBILINOGEN, NITRITE, LEUKOCYTESUR in the last 72 hours.  Invalid input(s): APPERANCEUR    Imaging: Dg Abd 1 View  Result Date: 09/20/2019 CLINICAL DATA:  OG tube placement EXAM: ABDOMEN - 1 VIEW COMPARISON:  None. FINDINGS: OG tube appears adequately positioned in the stomach with proximal sideholes just below the level of the gastroesophageal junction. Visualized bowel gas pattern is nonobstructive. No evidence of free intraperitoneal air or abnormal fluid collection. Aortic atherosclerosis. IMPRESSION: 1. OG tube appears adequately positioned in the stomach with proximal sideholes just below the level of the gastroesophageal junction. 2. Nonobstructive bowel gas pattern. 3. Aortic atherosclerosis. Electronically Signed   By: Franki Cabot M.D.   On:  09/20/2019 11:52   Ct Chest Wo Contrast  Result Date: 09/19/2019 CLINICAL DATA:  Midsternal chest pain for 2 weeks with shortness of breath. Nonsmoker without history of cancer. EXAM: CT CHEST WITHOUT CONTRAST TECHNIQUE: Multidetector CT imaging of the chest was performed following the standard protocol without IV contrast. COMPARISON:  09/05/2019 chest radiograph. Most recent CT of 09/02/2008 FINDINGS: Cardiovascular: Advanced aortic and branch vessel atherosclerosis. Tortuous thoracic aorta. Mild cardiomegaly, without pericardial effusion. Multivessel coronary artery atherosclerosis. Aortic valve calcification. Mediastinum/Nodes: Hypoattenuating left thyroid nodule of 1.2 cm, new but nonspecific. No mediastinal or definite hilar adenopathy, given limitations of unenhanced CT. Lungs/Pleura: No pleural fluid.  Clear lungs. Upper Abdomen: Nonspecific caudate lobe enlargement. Similar. Normal imaged portions of the spleen, adrenal glands. Renal atrophy bilaterally. An upper pole left renal exophytic 1.8 cm cyst. Pancreatic tail 1.8 cm lesion on 114/2 is minimally enlarged compared to 2009 where it measured 1.5 cm. Calcification in its inferior portion. There is also a incompletely imaged pancreatic body lesion on 125/2, on the order of 1.8 cm-similar. Suggestion of peripancreatic inflammation, especially about the pancreatic tail lesion on 113/2. Cholecystectomy. Musculoskeletal: Lower thoracic spondylosis. No sternal fracture or retrosternal fluid. IMPRESSION: 1.  No acute process in the chest. 2. Coronary artery atherosclerosis. Aortic Atherosclerosis (ICD10-I70.0). 3. Pancreatic cystic lesions, suboptimally and incompletely evaluated. Suspicion of peripancreatic edema. Correlate with symptoms to suggest acute on chronic pancreatitis. Consider follow-up with dedicated MRI or CT. 4. Chronic caudate lobe prominence, nonspecific. Correlate with any risk factors for cirrhosis. Electronically Signed   By: Abigail Miyamoto M.D.   On: 09/19/2019 12:28   Dg Chest Port 1 View  Result Date: 09/20/2019 CLINICAL DATA:  Intubation EXAM: PORTABLE CHEST 1 VIEW COMPARISON:  09/20/2019, 5:50 a.m. FINDINGS: Interval placement of endotracheal tube, tip positioned over the lower trachea, approximately 2 cm above the carina. Esophagogastric tube is position with tip and side port below the diaphragm. Mild cardiomegaly. Interval improvement in bilateral heterogeneous and interstitial opacity, most conspicuous in the right midlung. IMPRESSION: 1. Interval placement of endotracheal tube, tip positioned over the lower trachea, approximately 2 cm above the carina. Esophagogastric tube is position with tip and side port below the diaphragm. 2. Interval improvement in bilateral heterogeneous and interstitial opacity, most conspicuous in the right midlung, likely improved edema. Electronically Signed   By: Eddie Candle M.D.   On: 09/20/2019 11:20   Dg Chest Port 1 View  Result Date: 09/20/2019 CLINICAL DATA:  Intubated. Respiratory distress. EXAM: PORTABLE CHEST 1 VIEW COMPARISON:  09/05/2019  FINDINGS: The endotracheal tube tip is at the thoracic inlet. Borderline enlarged cardiac silhouette. Tortuous and calcified thoracic aorta. Extensive patchy bilateral airspace opacity, greater on the right. No pleural fluid. Thoracic spine degenerative changes. IMPRESSION: 1. Endotracheal tube tip at the thoracic inlet. It is recommended that this be advanced 4.3 cm. 2. Extensive patchy bilateral probable pneumonia. 3. Aortic atherosclerosis. Electronically Signed   By: Claudie Revering M.D.   On: 09/20/2019 06:41     Medications:   . azithromycin    . cefTRIAXone (ROCEPHIN)  IV    . nitroGLYCERIN 6.667 mcg/min (09/21/19 0919)  . propofol Stopped (09/21/19 0755)   . aspirin  300 mg Rectal Daily  . Chlorhexidine Gluconate Cloth  6 each Topical Q0600  . epoetin (EPOGEN/PROCRIT) injection  4,000 Units Intravenous Q T,Th,Sa-HD  . heparin  injection (subcutaneous)  5,000 Units Subcutaneous Q8H  . insulin aspart  0-9 Units Subcutaneous Q4H  . mouth rinse  15 mL Mouth Rinse BID  . pantoprazole sodium  40 mg Per Tube Daily     Assessment/ Plan:  73 y.o. female with end stage renal disease on hemodialysis, hypertension, diabetes mellitus type II, overactive bladder, gout, COPD, congestive heart failure, coronary artery disease, aortic atherosclearosis  Active Problems:   Acute respiratory failure with hypoxemia ([place])  [facility]// [facility] //TuThSa-2// TW 60kg  #. ESRD with acute resp failure - Acute pulm edema vs pneumonia, flash pum edema after extubation today - 1800 cc removed with HD on satruday - will arange for urgent HD today - UF goal 1.5-2 kg as tolerated - patient has verbally consented   #. Anemia of CKD  Lab Results  Component Value Date   HGB 7.3 (L) 09/21/2019  low dose epo with HD Blood transfusion planned with HD today   #. SHPTH     Component Value Date/Time   PTH 160 (H) 05/24/2019 0413   Lab Results  Component Value Date   PHOS 3.9 05/24/2019  monitor phos during this admission       LOS: [place] 11/1/20209:32 [place], [place]

## 2019-09-21 NOTE — Progress Notes (Signed)
Pre HD Assessment    09/21/19 1125  Neurological  Level of Consciousness Alert  Orientation Level Oriented X4  Respiratory  Respiratory Pattern Regular;Unlabored  Chest Assessment Chest expansion symmetrical  Bilateral Breath Sounds Clear  Cough None  Cardiac  Pulse Regular  Heart Sounds S1, S2  Cardiac Rhythm NSR  Vascular  R Radial Pulse +2  L Radial Pulse +2  Edema Generalized  Psychosocial  Psychosocial (WDL) WDL  Patient Behaviors Calm;Cooperative

## 2019-09-21 NOTE — Consult Note (Signed)
Cardiology Consultation:   Patient ID: JAMAIKA BRASSEAUX MRN: [id number]; DOB: [birth year]  Admit date: 09/20/2019 Date of Consult: 09/21/2019  Primary Care Provider: [name], MD Primary Cardiologist: Ida Rogue, MD  Physician requesting consult: Dr. Patsey Berthold Reason for consult: Flash pulmonary edema   Patient Profile:   MIAKODA ANDAZOLA is a 73 y.o. female with a hx of  aortic valve stenosis chronic renal failure, on hemodialysis 3 days per week  diffuse aortic atherosclerosis seen on CT scan 2014,  obesity,  hypertension,  hyperlipidemia  Presenting to the hospital with worsening shortness of breath, saturation 60% on room air    History of Present Illness:   Ms. Shinder presented yesterday with hypoxia, saturations 60%, found by fire department, failed CPAP on arrival, was intubated.  She had urgent hemodialysis (last was October 29 and she completed her treatment and achieved dry weight)  Chest x-ray with bilateral patchy infiltrates, pneumonia versus pulmonary edema After intubation, repeat chest x-ray clear suggesting pulmonary edema  Extubated today to nasal cannula Shortly after with respiratory distress, placed on BiPAP for support Nephrology called for hemodialysis again  Hemoglobin down to 7.3, sensitivity troponin 81  EKG was performed on BiPAP, diffuse ST depressions anterolateral, inferior leads   Heart Pathway Score:     Past Medical History:  Diagnosis Date   (HFpEF) heart failure with preserved ejection fraction ([place])    a. TTE 01/2014: nl LV sys fxn, no valvular abnormalities; b. TTE 11/16: nl EF, mild LVH;  c. 04/2019 Echo: EF 60-65%. DD. Nl RV fxn. Mod AS. 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   Allergy    Anemia of chronic disease    Anxiety    Aortic atherosclerosis (HCC)    Asthma    Chronic back pain    COPD (chronic obstructive pulmonary disease) ([place])    Diabetes mellitus with complication ([place])     ESRD on hemodialysis ([place])    a. Tues/Sat; b. 2/2 small kidneys   Essential hypertension    Fistula    lower left arm   GERD (gastroesophageal reflux disease)    Gout    History of exercise stress test    a. 01/2014: no evidence of ischemia; b. Lexiscan 08/2015: no sig ischemia, severe GI uptake artifact, low risk; c. CPET @ [facility] 09/2016: exercised 3 min 12 sec on bike without incline, 2.28 METs, VO2 of 8.1, 48% of predicted, indicating mod to sev functional impairment, evidence of blunted HR, stroke volume, and BP augmentation as well as ventilation-perfusion mismatch with exercise   HLD (hyperlipidemia)    Moderate aortic stenosis    a. 04/2019 Echo: Mod AS.   Non-obstructive Carotid arterial disease ([place])    a. 12/2017: <50% bilat ICA dzs.   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    right chest   Sleep apnea     Past Surgical History:  Procedure Laterality Date   carpel tunnel     GALLBLADDER SURGERY     PERIPHERAL VASCULAR CATHETERIZATION N/A 04/12/2015   Procedure: A/V Shuntogram/Fistulagram;  Surgeon: Algernon Huxley, MD;  Location: [place] [facility];  Service: Cardiovascular;  Laterality: N/A;   PERIPHERAL VASCULAR CATHETERIZATION N/A 04/12/2015   Procedure: A/V Shunt Intervention;  Surgeon: Algernon Huxley, MD;  Location: [place] [facility];  Service: Cardiovascular;  Laterality: N/A;   PERIPHERAL VASCULAR CATHETERIZATION N/A 06/09/2015   Procedure: Dialysis/Perma Catheter Removal;  Surgeon: Katha Cabal, MD;  Location: [place] [facility];  Service:  Cardiovascular;  Laterality: N/A;     Home Medications:  Prior to Admission medications   Medication Sig Start Date End Date Taking? Authorizing Provider  albuterol (PROVENTIL HFA;VENTOLIN HFA) 108 (90 Base) MCG/ACT inhaler Inhale 2 puffs into the lungs every 6 (six) hours as needed for wheezing or shortness of breath.   Yes [provider]  albuterol (PROVENTIL) (2.5 MG/3ML) 0.083% nebulizer  solution Take 3 mLs (2.5 mg total) by nebulization every 6 (six) hours as needed for wheezing or shortness of breath. 05/04/19  Yes Nance Pear, MD  amLODipine (NORVASC) 10 MG tablet Take 10 mg by mouth daily.  12/29/13  Yes [provider]  calcium acetate (PHOSLO) 667 MG capsule Take 1,334 mg by mouth 3 (three) times daily with meals.    Yes [provider]  carvedilol (COREG) 12.5 MG tablet Take 12.5 mg by mouth 2 (two) times daily with a meal.  01/09/14  Yes [provider]  cetirizine (ZYRTEC) 10 MG tablet Take 10 mg by mouth daily as needed for allergies.    Yes [provider]  clopidogrel (PLAVIX) 75 MG tablet Take 1 tablet (75 mg total) by mouth daily. 01/18/18  Yes Epifanio Lesches, MD  Fluticasone-Salmeterol (ADVAIR) 250-50 MCG/DOSE AEPB Inhale 1 puff into the lungs 2 (two) times daily as needed (for shortness of breath). 09/07/19  Yes Henreitta Leber, MD  furosemide (LASIX) 80 MG tablet Take 1 tablet (80 mg total) by mouth daily. 03/17/18  Yes Wieting, Richard, MD  hydrALAZINE (APRESOLINE) 100 MG tablet Take 1 tablet (100 mg total) by mouth 3 (three) times daily. 06/02/19  Yes Keno Caraway, Kathlene November, MD  HYDROcodone-acetaminophen (NORCO) 7.5-325 MG tablet Take 1-2 tablets by mouth daily as needed (pain).  04/09/19  Yes [provider]  lidocaine-prilocaine (EMLA) cream Apply 1 application topically as needed (prior to treatment).    Yes [provider]  losartan (COZAAR) 50 MG tablet Take 50 mg by mouth daily. 03/18/19  Yes [provider]  omeprazole (PRILOSEC) 20 MG capsule Take 1 capsule (20 mg total) by mouth 2 (two) times daily before a meal. 09/07/19  Yes Sainani, Belia Heman, MD  ondansetron (ZOFRAN) 4 MG tablet Take 4 mg by mouth every 8 (eight) hours as needed. 05/20/19  Yes [provider]  oxybutynin (DITROPAN XL) 15 MG 24 hr tablet Take 15 mg by mouth daily. 12/24/17  Yes [provider]  pravastatin  (PRAVACHOL) 40 MG tablet Take 40 mg by mouth at bedtime.    Yes [provider]  topiramate (TOPAMAX) 25 MG tablet Take 1 tablet (25 mg total) by mouth as needed (for headaches). 09/07/19  Yes Henreitta Leber, MD    Inpatient Medications: Scheduled Meds:  aspirin  300 mg Rectal Daily   Chlorhexidine Gluconate Cloth  6 each Topical Q0600   epoetin (EPOGEN/PROCRIT) injection  4,000 Units Intravenous Q T,Th,Sa-HD   insulin aspart  0-9 Units Subcutaneous Q4H   mouth rinse  15 mL Mouth Rinse BID   pantoprazole sodium  40 mg Per Tube Daily   Continuous Infusions:  heparin 700 Units/hr (09/21/19 1228)   nitroGLYCERIN 60 mcg/min (09/21/19 1228)   propofol Stopped (09/21/19 0755)   PRN Meds: HYDROcodone-acetaminophen, midazolam  Allergies:    Allergies  Allergen Reactions   Enalapril Maleate Other (See Comments)    Other reaction(s): Headache   Nitrofurantoin Swelling and Rash    Other Reaction: swelling of body   Sulfamethoxazole-Trimethoprim Swelling   2,4-D Dimethylamine (Amisol) Rash  and Other (See Comments)    Other Reaction: h/a   Baclofen Other (See Comments) and Nausea Only    lightheadness ,drowsiness , muscle weakness , twitching in hands    Neosporin [Neomycin-Bacitracin Zn-Polymyx] Other (See Comments) and Rash    Other Reaction: irritation Skin irritation    Quinine Nausea And Vomiting, Rash and Other (See Comments)    Other Reaction: Vomiting, rash, h/a, vision   Ultram [Tramadol] Palpitations   Zocor [Simvastatin] Other (See Comments) and Rash    Other Reaction: muscle spasms Muscle pain and spasms   Bactrim [Sulfamethoxazole-Trimethoprim] Swelling   Levodopa Other (See Comments)    Reaction: unknown   Macrodantin [Nitrofurantoin Macrocrystal] Swelling   Quinine Derivatives Other (See Comments)    Vertigo,nausea vomiting blurred vision headache ears sensitivity     Social History:   Social History   Socioeconomic History    Marital status: Single    Spouse name: Not on file   Number of children: Not on file   Years of education: Not on file   Highest education level: Not on file  Occupational History   Not on file  Social Needs   Financial resource strain: Not on file   Food insecurity    Worry: Not on file    Inability: Not on file   Transportation needs    Medical: Not on file    Non-medical: Not on file  Tobacco Use   Smoking status: Never Smoker   Smokeless tobacco: Never Used  Substance and Sexual Activity   Alcohol use: No   Drug use: No   Sexual activity: Not Currently  Lifestyle   Physical activity    Days per week: Not on file    Minutes per session: Not on file   Stress: Not on file  Relationships   Social connections    Talks on phone: Not on file    Gets together: Not on file    Attends religious service: Not on file    Active member of club or organization: Not on file    Attends meetings of clubs or organizations: Not on file    Relationship status: Not on file   Intimate partner violence    Fear of current or ex partner: Not on file    Emotionally abused: Not on file    Physically abused: Not on file    Forced sexual activity: Not on file  Other Topics Concern   Not on file  Social History Narrative   Not on file    Family History:    Family History  Problem Relation Age of Onset   Hypertension Mother    Hyperlipidemia Mother    Heart disease Father    Heart attack Father 59   Hypertension Father    Hyperlipidemia Father    Heart disease Brother        CABG    Heart attack Brother    Breast cancer Neg Hx      ROS:  Please see the history of present illness.  Review of Systems  Constitutional: Negative.   HENT: Negative.   Respiratory: Positive for shortness of breath.   Cardiovascular: Negative.   Gastrointestinal: Negative.   Musculoskeletal: Negative.   Neurological: Negative.   Psychiatric/Behavioral: Negative.   All  other systems reviewed and are negative.      Physical Exam/Data:   Vitals:   09/21/19 1400 09/21/19 1401 09/21/19 1415 09/21/19 1430  BP: (!) 85/57 105/65 117/67 (!) 138/56  Pulse: 84  86 86 86  Resp: 17 18 20 20   Temp: 99.7 F (37.6 C) 99.7 F (37.6 C) 99.7 F (37.6 C) 99.7 F (37.6 C)  TempSrc:      SpO2: 100% 99% 100% 100%  Weight:      Height:        Intake/Output Summary (Last 24 hours) at 09/21/2019 1452 Last data filed at 09/21/2019 1330 Gross per 24 hour  Intake 1125.57 ml  Output 1975 ml  Net -849.43 ml   Last 3 Weights 09/21/2019 09/21/2019 09/20/2019  Weight (lbs) 130 lb 8.2 oz 130 lb 8.2 oz 140 lb 14 oz  Weight (kg) 59.2 kg 59.2 kg 63.9 kg     Body mass index is 23.87 kg/m.  General:  Well nourished, mild respiratory distress, on BiPAP, appears pale HEENT: normal Lymph: no adenopathy Neck: no JVD Endocrine:  No thryomegaly Vascular: No carotid bruits; FA pulses 2+ bilaterally without bruits  Cardiac:  normal S1, S2; RRR; no murmur  Lungs: Scattered Rales, coarse breath sounds Abd: soft, nontender, no hepatomegaly  Ext: no edema Musculoskeletal:  No deformities, BUE and BLE strength normal and equal Skin: warm and dry  Neuro:  CNs 2-12 intact, no focal abnormalities noted Psych:  Normal affect   EKG:  The EKG was personally reviewed and demonstrates:   On arrival normal sinus rhythm rate 92 bpm prolonged QTc  502  Telemetry:  Telemetry was personally reviewed and demonstrates:   Normal sinus rhythm  Relevant CV Studies: Echocardiogram performed today 1. Left ventricular ejection fraction, by visual estimation, is 60 to 65%. The left ventricle has normal function. There is mildly increased left ventricular hypertrophy.  2. Global right ventricle has normal systolic function.The right ventricular size is normal. No increase in right ventricular wall thickness.  3. Left atrial size was mild-moderately dilated.  4. Mild mitral valve regurgitation.  5.  The tricuspid valve is normal in structure. Tricuspid valve regurgitation is not demonstrated.  6. The aortic valve was not well visualized. Measurements as detailed below, Moderate aortic valve stenosis by gradients, severe stenosis by estimated AVA.  7. TR signal is inadequate for assessing pulmonary artery systolic pressure.  8. Aortic valve mean gradient measures 20.6 mmHg.  9. Aortic valve peak gradient measures 32.0 mmHg. 10. Aortic valve area, by VTI measures 0.68 cm.   Laboratory Data:  High Sensitivity Troponin:   Recent Labs  Lab 09/05/19 2357 09/06/19 0119 09/20/19 0526 09/20/19 0756 09/21/19 0945  TROPONINIHS 109* 111* 41* 32* 81*     Chemistry Recent Labs  Lab 09/20/19 0526 09/21/19 0435  NA 138 140  K 3.7 3.9  CL 99 97*  CO2 25 26  GLUCOSE 190* 123*  BUN 49* 18  CREATININE 6.54* 3.05*  CALCIUM 7.8* 8.4*  GFRNONAA 6* 15*  GFRAA 7* 17*  ANIONGAP 14 17*    Recent Labs  Lab 09/20/19 0526  PROT 6.8  ALBUMIN 3.9  AST 16  ALT 11  ALKPHOS 68  BILITOT 0.7   Hematology Recent Labs  Lab 09/20/19 0526 09/21/19 0435  WBC 15.1* 9.3  RBC 2.65* 2.44*  HGB 7.9* 7.3*  HCT 24.5* 21.5*  MCV 92.5 88.1  MCH 29.8 29.9  MCHC 32.2 34.0  RDW 14.2 14.0  PLT 206 126*   BNP Recent Labs  Lab 09/20/19 0526  BNP 1,516.0*    DDimer No results for input(s): DDIMER in the last 168 hours.   Radiology/Studies:  Dg Abd 1 View  Result Date: 09/20/2019 CLINICAL  DATA:  OG tube placement EXAM: ABDOMEN - 1 VIEW COMPARISON:  None. FINDINGS: OG tube appears adequately positioned in the stomach with proximal sideholes just below the level of the gastroesophageal junction. Visualized bowel gas pattern is nonobstructive. No evidence of free intraperitoneal air or abnormal fluid collection. Aortic atherosclerosis. IMPRESSION: 1. OG tube appears adequately positioned in the stomach with proximal sideholes just below the level of the gastroesophageal junction. 2. Nonobstructive  bowel gas pattern. 3. Aortic atherosclerosis. Electronically Signed   By: Franki Cabot M.D.   On: 09/20/2019 11:52   Ct Chest Wo Contrast  Result Date: 09/19/2019 CLINICAL DATA:  Midsternal chest pain for 2 weeks with shortness of breath. Nonsmoker without history of cancer. EXAM: CT CHEST WITHOUT CONTRAST TECHNIQUE: Multidetector CT imaging of the chest was performed following the standard protocol without IV contrast. COMPARISON:  09/05/2019 chest radiograph. Most recent CT of 09/02/2008 FINDINGS: Cardiovascular: Advanced aortic and branch vessel atherosclerosis. Tortuous thoracic aorta. Mild cardiomegaly, without pericardial effusion. Multivessel coronary artery atherosclerosis. Aortic valve calcification. Mediastinum/Nodes: Hypoattenuating left thyroid nodule of 1.2 cm, new but nonspecific. No mediastinal or definite hilar adenopathy, given limitations of unenhanced CT. Lungs/Pleura: No pleural fluid.  Clear lungs. Upper Abdomen: Nonspecific caudate lobe enlargement. Similar. Normal imaged portions of the spleen, adrenal glands. Renal atrophy bilaterally. An upper pole left renal exophytic 1.8 cm cyst. Pancreatic tail 1.8 cm lesion on 114/2 is minimally enlarged compared to 2009 where it measured 1.5 cm. Calcification in its inferior portion. There is also a incompletely imaged pancreatic body lesion on 125/2, on the order of 1.8 cm-similar. Suggestion of peripancreatic inflammation, especially about the pancreatic tail lesion on 113/2. Cholecystectomy. Musculoskeletal: Lower thoracic spondylosis. No sternal fracture or retrosternal fluid. IMPRESSION: 1.  No acute process in the chest. 2. Coronary artery atherosclerosis. Aortic Atherosclerosis (ICD10-I70.0). 3. Pancreatic cystic lesions, suboptimally and incompletely evaluated. Suspicion of peripancreatic edema. Correlate with symptoms to suggest acute on chronic pancreatitis. Consider follow-up with dedicated MRI or CT. 4. Chronic caudate lobe prominence,  nonspecific. Correlate with any risk factors for cirrhosis. Electronically Signed   By: Abigail Miyamoto M.D.   On: 09/19/2019 12:28   Dg Chest Port 1 View  Result Date: 09/20/2019 CLINICAL DATA:  Intubation EXAM: PORTABLE CHEST 1 VIEW COMPARISON:  09/20/2019, 5:50 a.m. FINDINGS: Interval placement of endotracheal tube, tip positioned over the lower trachea, approximately 2 cm above the carina. Esophagogastric tube is position with tip and side port below the diaphragm. Mild cardiomegaly. Interval improvement in bilateral heterogeneous and interstitial opacity, most conspicuous in the right midlung. IMPRESSION: 1. Interval placement of endotracheal tube, tip positioned over the lower trachea, approximately 2 cm above the carina. Esophagogastric tube is position with tip and side port below the diaphragm. 2. Interval improvement in bilateral heterogeneous and interstitial opacity, most conspicuous in the right midlung, likely improved edema. Electronically Signed   By: Eddie Candle M.D.   On: 09/20/2019 11:20   Dg Chest Port 1 View  Result Date: 09/20/2019 CLINICAL DATA:  Intubated. Respiratory distress. EXAM: PORTABLE CHEST 1 VIEW COMPARISON:  09/05/2019 FINDINGS: The endotracheal tube tip is at the thoracic inlet. Borderline enlarged cardiac silhouette. Tortuous and calcified thoracic aorta. Extensive patchy bilateral airspace opacity, greater on the right. No pleural fluid. Thoracic spine degenerative changes. IMPRESSION: 1. Endotracheal tube tip at the thoracic inlet. It is recommended that this be advanced 4.3 cm. 2. Extensive patchy bilateral probable pneumonia. 3. Aortic atherosclerosis. Electronically Signed   By: Percell Locus.D.  On: 09/20/2019 06:41    Assessment and Plan:   1. Flash pulmonary edema Likely multifactorial in the setting of end-stage renal disease/hemodialysis dependent, worsening anemia, also with moderate to severe aortic valve stenosis.  -Agree with plan for hemodialysis  again today -Aortic valve stenosis likely needs further work-up as detailed below -May also benefit from ischemic work-up -Would continue to trend troponin Impressive EKG from this morning with diffuse ST depressions (in the chart)  2.  Aortic valve stenosis Measurements appear moderate by gradient though severe by estimated aortic valve area -She would likely benefit from transesophageal echo for further measurements As valve stenosis gets worse, she will have very narrow therapeutic window for fluid status, leading to frequent recurrences of flash pulmonary edema -Could also consider right and left heart catheterization, with attempt to cross aortic valve for pressure measurements.   3.  Elevated troponin Markedly abnormal EKG today diffuse ST depressions, in the chart Would recheck troponin.  Initial was 43 -We will discuss with team on Monday, may benefit from right and left heart catheterization, attempt to cross aortic valve -If unable to cross valve may need transesophageal echo  4.  Anemia Agree with transfusion Plan also to give Epogen with hemodialysis -Ideally goal hemoglobin of 10 Currently around 7  5.  End-stage renal disease on hemodialysis 3 days/week May need to lower dry weight if tolerated  Discussed with critical care team, nursing  Total encounter time more than 110 minutes  Greater than 50% was spent in counseling and coordination of care with the patient   For questions or updates, please contact [place] Please consult [email or web address] for contact info under     Signed, Ida Rogue, MD  09/21/2019 2:52 PM

## 2019-09-21 NOTE — Progress Notes (Signed)
Post HD Assessment    09/21/19 1510  Neurological  Level of Consciousness Alert  Orientation Level Oriented X4  Respiratory  Respiratory Pattern Regular;Unlabored  Chest Assessment Chest expansion symmetrical  Bilateral Breath Sounds Clear;Diminished  Cough None  Cardiac  Pulse Regular  Heart Sounds S1, S2  Cardiac Rhythm NSR  Vascular  R Radial Pulse +2  L Radial Pulse +2  Edema Generalized  Psychosocial  Psychosocial (WDL) WDL  Patient Behaviors Calm;Cooperative

## 2019-09-21 NOTE — Progress Notes (Signed)
*  PRELIMINARY RESULTS* Echocardiogram 2D Echocardiogram has been performed.  Sherrie Sport 09/21/2019, 1:00 PM

## 2019-09-21 NOTE — Significant Event (Signed)
Patient extubated without sequela subsequently developed flash pulmonary edema.  Became very tachypneic and hypertensive.  Required placement on BiPAP.  EKG shows ischemic changes.  Opponent I on upward rise.  She was stabilized on BiPAP, placed on heparin infusion, nitroglycerin infusion, given aspirin.  Have communicated with Dr. Rockey Situ on call for Dr. Fletcher Anon.  He will see the patient urgently.  I have discussed with Dr. Candiss Norse and she will be dialyzed again.  Suspect near critical AS as part of the issue.  However need to rule out ischemia.  Note is made that the patient has been having intermittent chest pain for approximately 2 months.  Renold Don, MD  PCCM

## 2019-09-22 ENCOUNTER — Inpatient Hospital Stay: Payer: Medicare Other

## 2019-09-22 DIAGNOSIS — I350 Nonrheumatic aortic (valve) stenosis: Secondary | ICD-10-CM

## 2019-09-22 DIAGNOSIS — I214 Non-ST elevation (NSTEMI) myocardial infarction: Secondary | ICD-10-CM

## 2019-09-22 DIAGNOSIS — R7989 Other specified abnormal findings of blood chemistry: Secondary | ICD-10-CM

## 2019-09-22 LAB — COMPREHENSIVE METABOLIC PANEL
ALT: 12 U/L (ref 0–44)
AST: 15 U/L (ref 15–41)
Albumin: 3.3 g/dL — ABNORMAL LOW (ref 3.5–5.0)
Alkaline Phosphatase: 44 U/L (ref 38–126)
Anion gap: 13 (ref 5–15)
BUN: 18 mg/dL (ref 8–23)
CO2: 30 mmol/L (ref 22–32)
Calcium: 7.8 mg/dL — ABNORMAL LOW (ref 8.9–10.3)
Chloride: 100 mmol/L (ref 98–111)
Creatinine, Ser: 3.05 mg/dL — ABNORMAL HIGH (ref 0.44–1.00)
GFR calc Af Amer: 17 mL/min — ABNORMAL LOW (ref >60)
GFR calc non Af Amer: 15 mL/min — ABNORMAL LOW (ref >60)
Glucose, Bld: 95 mg/dL (ref 70–99)
Potassium: 3.1 mmol/L — ABNORMAL LOW (ref 3.5–5.1)
Sodium: 143 mmol/L (ref 135–145)
Total Bilirubin: 1.3 mg/dL — ABNORMAL HIGH (ref 0.3–1.2)
Total Protein: 5.7 g/dL — ABNORMAL LOW (ref 6.5–8.1)

## 2019-09-22 LAB — TROPONIN I (HIGH SENSITIVITY)
Troponin I (High Sensitivity): 211 ng/L (ref <18)
Troponin I (High Sensitivity): 204 ng/L (ref <18)
Troponin I (High Sensitivity): 185 ng/L (ref <18)

## 2019-09-22 LAB — FIBRINOGEN: Fibrinogen: 485 mg/dL — ABNORMAL HIGH (ref 210–475)

## 2019-09-22 LAB — CBC
HCT: 24 % — ABNORMAL LOW (ref 36.0–46.0)
HCT: 26 % — ABNORMAL LOW (ref 36.0–46.0)
Hemoglobin: 8 g/dL — ABNORMAL LOW (ref 12.0–15.0)
Hemoglobin: 8.5 g/dL — ABNORMAL LOW (ref 12.0–15.0)
MCH: 29.4 pg (ref 26.0–34.0)
MCH: 29.3 pg (ref 26.0–34.0)
MCHC: 33.3 g/dL (ref 30.0–36.0)
MCHC: 32.7 g/dL (ref 30.0–36.0)
MCV: 88.2 fL (ref 80.0–100.0)
MCV: 89.7 fL (ref 80.0–100.0)
Platelets: 107 10*3/uL — ABNORMAL LOW (ref 150–400)
Platelets: 108 10*3/uL — ABNORMAL LOW (ref 150–400)
RBC: 2.72 MIL/uL — ABNORMAL LOW (ref 3.87–5.11)
RBC: 2.9 MIL/uL — ABNORMAL LOW (ref 3.87–5.11)
RDW: 15.9 % — ABNORMAL HIGH (ref 11.5–15.5)
RDW: 15.8 % — ABNORMAL HIGH (ref 11.5–15.5)
WBC: 6.6 10*3/uL (ref 4.0–10.5)
WBC: 9.1 10*3/uL (ref 4.0–10.5)
nRBC: 0 % (ref 0.0–0.2)
nRBC: 0 % (ref 0.0–0.2)

## 2019-09-22 LAB — GLUCOSE, CAPILLARY
Glucose-Capillary: 88 mg/dL (ref 70–99)
Glucose-Capillary: 84 mg/dL (ref 70–99)
Glucose-Capillary: 81 mg/dL (ref 70–99)
Glucose-Capillary: 98 mg/dL (ref 70–99)

## 2019-09-22 LAB — APTT: aPTT: 35 seconds (ref 24–36)

## 2019-09-22 LAB — PHOSPHORUS: Phosphorus: 2.5 mg/dL (ref 2.5–4.6)

## 2019-09-22 LAB — PROTIME-INR
INR: 1.1 (ref 0.8–1.2)
Prothrombin Time: 14.1 seconds (ref 11.4–15.2)

## 2019-09-22 LAB — LACTIC ACID, PLASMA
Lactic Acid, Venous: 0.8 mmol/L (ref 0.5–1.9)
Lactic Acid, Venous: 0.6 mmol/L (ref 0.5–1.9)

## 2019-09-22 LAB — HIV ANTIBODY (ROUTINE TESTING W REFLEX): HIV Screen 4th Generation wRfx: NONREACTIVE

## 2019-09-22 LAB — MAGNESIUM: Magnesium: 2 mg/dL (ref 1.7–2.4)

## 2019-09-22 LAB — LIPASE, BLOOD: Lipase: 17 U/L (ref 11–51)

## 2019-09-22 LAB — PREPARE RBC (CROSSMATCH)

## 2019-09-22 LAB — PROCALCITONIN: Procalcitonin: 3.98 ng/mL

## 2019-09-22 LAB — FIBRIN DERIVATIVES D-DIMER (ARMC ONLY): Fibrin derivatives D-dimer (ARMC): 900.93 ng/mL (FEU) — ABNORMAL HIGH (ref 0.00–499.00)

## 2019-09-22 MED ORDER — GUAIFENESIN-DM 100-10 MG/5ML PO SYRP
5.0000 mL | ORAL_SOLUTION | Freq: Four times a day (QID) | ORAL | Status: DC | PRN
  Administered 2019-09-22: 5 mL via ORAL
  Filled 2019-09-22: qty 5

## 2019-09-22 MED ORDER — MORPHINE SULFATE (PF) 2 MG/ML IV SOLN
1.0000 mg | INTRAVENOUS | Status: DC | PRN
  Administered 2019-09-22 – 2019-09-26 (×4): 2 mg via INTRAVENOUS
  Filled 2019-09-22 (×4): qty 1

## 2019-09-22 MED ORDER — PANTOPRAZOLE SODIUM 40 MG IV SOLR
40.0000 mg | Freq: Every day | INTRAVENOUS | Status: DC
  Administered 2019-09-22 – 2019-09-28 (×7): 40 mg via INTRAVENOUS
  Filled 2019-09-22 (×7): qty 40

## 2019-09-22 MED ORDER — CARVEDILOL 12.5 MG PO TABS
12.5000 mg | ORAL_TABLET | Freq: Two times a day (BID) | ORAL | Status: DC
  Administered 2019-09-22 – 2019-09-30 (×14): 12.5 mg via ORAL
  Filled 2019-09-22 (×14): qty 1

## 2019-09-22 MED ORDER — SODIUM CHLORIDE 0.9 % IV SOLN
0.3000 ug/kg | Freq: Once | INTRAVENOUS | Status: AC
  Administered 2019-09-22: 18 ug via INTRAVENOUS
  Filled 2019-09-22: qty 4.5

## 2019-09-22 MED ORDER — FUROSEMIDE 10 MG/ML IJ SOLN
80.0000 mg | Freq: Once | INTRAMUSCULAR | Status: AC
  Administered 2019-09-22: 80 mg via INTRAVENOUS
  Filled 2019-09-22: qty 8

## 2019-09-22 MED ORDER — AMLODIPINE BESYLATE 10 MG PO TABS
10.0000 mg | ORAL_TABLET | Freq: Every day | ORAL | Status: DC
  Administered 2019-09-22 – 2019-09-30 (×8): 10 mg via ORAL
  Filled 2019-09-22 (×8): qty 1

## 2019-09-22 MED ORDER — SODIUM CHLORIDE 0.9 % IV SOLN
1.0000 g | INTRAVENOUS | Status: DC
  Administered 2019-09-22 – 2019-09-25 (×4): 1 g via INTRAVENOUS
  Filled 2019-09-22: qty 10
  Filled 2019-09-22 (×3): qty 1

## 2019-09-22 MED ORDER — ACETAMINOPHEN 500 MG PO TABS
500.0000 mg | ORAL_TABLET | Freq: Four times a day (QID) | ORAL | Status: DC | PRN
  Administered 2019-09-22 – 2019-09-29 (×4): 500 mg via ORAL
  Filled 2019-09-22 (×4): qty 1

## 2019-09-22 MED ORDER — SODIUM CHLORIDE 0.9 % IV SOLN
500.0000 mg | INTRAVENOUS | Status: AC
  Administered 2019-09-22 – 2019-09-25 (×4): 500 mg via INTRAVENOUS
  Filled 2019-09-22 (×4): qty 500

## 2019-09-22 MED ORDER — ALBUTEROL SULFATE (2.5 MG/3ML) 0.083% IN NEBU
2.5000 mg | INHALATION_SOLUTION | RESPIRATORY_TRACT | Status: DC | PRN
  Administered 2019-09-29 (×2): 2.5 mg via RESPIRATORY_TRACT
  Filled 2019-09-22 (×2): qty 3

## 2019-09-22 MED ORDER — POTASSIUM CHLORIDE 20 MEQ PO PACK
20.0000 meq | PACK | Freq: Once | ORAL | Status: AC
  Administered 2019-09-22: 20 meq via ORAL
  Filled 2019-09-22: qty 1

## 2019-09-22 MED ORDER — ASPIRIN 81 MG PO CHEW
81.0000 mg | CHEWABLE_TABLET | Freq: Every day | ORAL | Status: DC
  Administered 2019-09-24 – 2019-09-30 (×7): 81 mg via ORAL
  Filled 2019-09-22 (×7): qty 1

## 2019-09-22 MED ORDER — ASPIRIN EC 325 MG PO TBEC: 325.0000 mg | DELAYED_RELEASE_TABLET | Freq: Every day | ORAL | Status: DC

## 2019-09-22 MED ORDER — SODIUM CHLORIDE 0.9 % IV SOLN
INTRAVENOUS | Status: DC | PRN
  Administered 2019-09-22: 250 mL via INTRAVENOUS

## 2019-09-22 MED ORDER — SODIUM CHLORIDE 0.9% IV SOLUTION: Freq: Once | INTRAVENOUS | Status: DC

## 2019-09-22 MED ORDER — FUROSEMIDE 10 MG/ML IJ SOLN
40.0000 mg | Freq: Once | INTRAMUSCULAR | Status: AC
  Administered 2019-09-22: 40 mg via INTRAVENOUS
  Filled 2019-09-22: qty 4

## 2019-09-22 NOTE — Progress Notes (Signed)
Pharmacy Electrolyte Monitoring Consult:  Phyllis [name] is a 61 YOF admitted on 09/20/2019 with respiratory distress. She is a lifelong never smoker with ESRD on HD (Tues, Thurs, Sat). Patient presented with acute hypoxic respiratory failure due to volume overload.  She was admitted to the ICU, intubated, and mechanically ventilated. Emergency dialysis ensued. Experiencing chest pain this AM and a worsening cough.  Patient was dialyzed yesterday and received Lasix 80mg  IV at 1100 today. Pharmacy was consulted to assist in monitoring and replacing electrolytes.  Labs:  Sodium (mmol/L)  Date Value  09/22/2019 143  12/10/2014 142   Potassium (mmol/L)  Date Value  09/22/2019 3.1 (L)  12/10/2014 3.6   Magnesium (mg/dL)  Date Value  09/22/2019 2.0   Phosphorus (mg/dL)  Date Value  09/22/2019 2.5  12/09/2014 4.4   Calcium (mg/dL)  Date Value  09/22/2019 7.8 (L)   Calcium, Total (mg/dL)  Date Value  12/10/2014 8.6   Albumin (g/dL)  Date Value  09/22/2019 3.3 (L)  12/07/2014 2.5 (L)    Assessment/Plan:  - Conservative replacement with potassium 76mEq x1  - Will follow-up after AM labs  Goals:  - Mg ~ 2 - K ~ 4  Thank you for allowing pharmacy to be a part of this patient's care.  Raiford Simmonds, PharmD Candidate 09/22/2019 2:13 PM

## 2019-09-22 NOTE — Progress Notes (Signed)
Follow up - Critical Care Medicine Note  Patient Details:    Phyllis [name] is an 73 y.o. female lifelong never smoker with ESRD on hemodialysis Tuesday Thursday Saturday and history as noted below presented with acute hypoxic respiratory failure due to volume overload.    Patient was admitted to the ICU intubated and mechanically ventilated.  Emergent dialysis ensued.  Chest pain this morning despite nitroglycerine, cough worsening will start morphine.     Lines, Airways, Drains: Urethral Catheter Kandis Nab, RN Latex 16 Fr. (Active)  Indication for Insertion or Continuance of Catheter Unstable critically ill patients first 24-48 hours (See Criteria) 09/21/19 1209  Site Assessment Clean;Intact 09/21/19 1209  Catheter Maintenance Bag below level of bladder;Drainage bag/tubing not touching floor;No dependent loops 09/21/19 1209  Collection Container Standard drainage bag 09/21/19 1209  Securement Method Securing device (Describe) 09/21/19 1209  Urinary Catheter Interventions (if applicable) Unclamped [id number] 1209  Output (mL) 75 mL 09/21/19 0500    Anti-infectives:  Anti-infectives (From admission, onward)   Start     Dose/Rate Route Frequency Ordered Stop   09/21/19 1000  cefTRIAXone (ROCEPHIN) 2 g in sodium chloride 0.9 % 100 mL IVPB  Status:  Discontinued     2 g 200 mL/hr over 30 Minutes Intravenous Every 24 hours 09/20/19 0833 09/21/19 0936   09/21/19 1000  azithromycin (ZITHROMAX) 500 mg in sodium chloride 0.9 % 250 mL IVPB  Status:  Discontinued     500 mg 250 mL/hr over 60 Minutes Intravenous Every 24 hours 09/20/19 0833 09/21/19 0936   09/20/19 0700  cefTRIAXone (ROCEPHIN) 2 g in sodium chloride 0.9 % 100 mL IVPB  Status:  Discontinued     2 g 200 mL/hr over 30 Minutes Intravenous Every 24 hours 09/20/19 0650 09/20/19 0958   09/20/19 0700  azithromycin (ZITHROMAX) 500 mg in sodium chloride 0.9 % 250 mL IVPB  Status:  Discontinued     500 mg 250 mL/hr over 60 Minutes  Intravenous Every 24 hours 09/20/19 0650 09/20/19 0957      Microbiology: Results for orders placed or performed during the hospital encounter of 09/20/19  Culture, blood (routine x 2)     Status: None (Preliminary result)   Collection Time: 09/20/19  5:26 AM   Specimen: BLOOD  Result Value Ref Range Status   Specimen Description BLOOD RIGHT FOREARM  Final   Special Requests   Final    BOTTLES DRAWN AEROBIC AND ANAEROBIC Blood Culture adequate volume   Culture   Final    NO GROWTH 2 DAYS Performed at [facility], [address] [place], [place] [address]    Report Status PENDING  Incomplete  Culture, blood (routine x 2)     Status: None (Preliminary result)   Collection Time: 09/20/19  5:26 AM   Specimen: BLOOD  Result Value Ref Range Status   Specimen Description BLOOD RIGHT HAND  Final   Special Requests   Final    BOTTLES DRAWN AEROBIC AND ANAEROBIC Blood Culture adequate volume   Culture   Final    NO GROWTH 2 DAYS Performed at [facility], [address] [place],  [address]    Report Status PENDING  Incomplete  SARS Coronavirus 2 by RT PCR (hospital order, performed in [place] hospital lab) Nasopharyngeal Nasopharyngeal Swab     Status: None   Collection Time: 09/20/19  5:26 AM   Specimen: Nasopharyngeal Swab  Result Value Ref Range Status   SARS Coronavirus 2 NEGATIVE NEGATIVE  Final    Comment: (NOTE) If result is NEGATIVE SARS-CoV-2 target nucleic acids are NOT DETECTED. The SARS-CoV-2 RNA is generally detectable in upper and lower  respiratory specimens during the acute phase of infection. The lowest  concentration of SARS-CoV-2 viral copies this assay can detect is 250  copies / mL. A negative result does not preclude SARS-CoV-2 infection  and should not be used as the sole basis for treatment or other  patient management decisions.  A negative result may occur with  improper specimen collection / handling, submission of specimen  other  than nasopharyngeal swab, presence of viral mutation(s) within the  areas targeted by this assay, and inadequate number of viral copies  (<250 copies / mL). A negative result must be combined with clinical  observations, patient history, and epidemiological information. If result is POSITIVE SARS-CoV-2 target nucleic acids are DETECTED. The SARS-CoV-2 RNA is generally detectable in upper and lower  respiratory specimens dur ing the acute phase of infection.  Positive  results are indicative of active infection with SARS-CoV-2.  Clinical  correlation with patient history and other diagnostic information is  necessary to determine patient infection status.  Positive results do  not rule out bacterial infection or co-infection with other viruses. If result is PRESUMPTIVE POSTIVE SARS-CoV-2 nucleic acids MAY BE PRESENT.   A presumptive positive result was obtained on the submitted specimen  and confirmed on repeat testing.  While 2019 novel coronavirus  (SARS-CoV-2) nucleic acids may be present in the submitted sample  additional confirmatory testing may be necessary for epidemiological  and / or clinical management purposes  to differentiate between  SARS-CoV-2 and other Sarbecovirus currently known to infect humans.  If clinically indicated additional testing with an alternate test  methodology [phone]) is advised. The SARS-CoV-2 RNA is generally  detectable in upper and lower respiratory sp ecimens during the acute  phase of infection. The expected result is Negative. Fact Sheet for Patients:  StrictlyIdeas.no Fact Sheet for Healthcare Providers: BankingDealers.co.za This test is not yet approved or cleared by the [place] FDA and has been authorized for detection and/or diagnosis of SARS-CoV-2 by FDA under an Emergency Use Authorization (EUA).  This EUA will remain in effect (meaning this test can be used) for the duration  of the COVID-19 declaration under Section 564(b)(1) of the Act, 21 U.S.C. section 360bbb-3(b)(1), unless the authorization is terminated or revoked sooner. Performed at [facility], [address] [place], [place] [address]   MRSA PCR Screening     Status: None   Collection Time: 09/20/19 10:12 AM   Specimen: Nasopharyngeal  Result Value Ref Range Status   MRSA by PCR NEGATIVE NEGATIVE Final    Comment:        The GeneXpert MRSA Assay (FDA approved for NASAL specimens only), is one component of a comprehensive MRSA colonization surveillance program. It is not intended to diagnose MRSA infection nor to guide or monitor treatment for MRSA infections. Performed at [facility], [address] [place], [place] [address]     Best Practice/Protocols:  VTE Prophylaxis: Heparin (SQ) GI Prophylaxis: Proton Pump Inhibitor Ventilator weaning  Events: 10/31 admitted to ICU with volume overload, intubated, mechanically ventilated, emergent dialysis done. 11/01 SAT passed.  SBT passed.  Extubated 11/2 - complains of chest pain despite nitroglycerine troponin is uptrending.    Studies: Dg Abd 1 View  Result Date: 09/20/2019 CLINICAL DATA:  OG tube placement EXAM: ABDOMEN - 1 VIEW COMPARISON:  None. FINDINGS:  OG tube appears adequately positioned in the stomach with proximal sideholes just below the level of the gastroesophageal junction. Visualized bowel gas pattern is nonobstructive. No evidence of free intraperitoneal air or abnormal fluid collection. Aortic atherosclerosis. IMPRESSION: 1. OG tube appears adequately positioned in the stomach with proximal sideholes just below the level of the gastroesophageal junction. 2. Nonobstructive bowel gas pattern. 3. Aortic atherosclerosis. Electronically Signed   By: Franki Cabot M.D.   On: 09/20/2019 11:52   Ct Chest Wo Contrast  Result Date: 09/19/2019 CLINICAL DATA:  Midsternal chest pain for 2 weeks with shortness  of breath. Nonsmoker without history of cancer. EXAM: CT CHEST WITHOUT CONTRAST TECHNIQUE: Multidetector CT imaging of the chest was performed following the standard protocol without IV contrast. COMPARISON:  09/05/2019 chest radiograph. Most recent CT of 09/02/2008 FINDINGS: Cardiovascular: Advanced aortic and branch vessel atherosclerosis. Tortuous thoracic aorta. Mild cardiomegaly, without pericardial effusion. Multivessel coronary artery atherosclerosis. Aortic valve calcification. Mediastinum/Nodes: Hypoattenuating left thyroid nodule of 1.2 cm, new but nonspecific. No mediastinal or definite hilar adenopathy, given limitations of unenhanced CT. Lungs/Pleura: No pleural fluid.  Clear lungs. Upper Abdomen: Nonspecific caudate lobe enlargement. Similar. Normal imaged portions of the spleen, adrenal glands. Renal atrophy bilaterally. An upper pole left renal exophytic 1.8 cm cyst. Pancreatic tail 1.8 cm lesion on 114/2 is minimally enlarged compared to 2009 where it measured 1.5 cm. Calcification in its inferior portion. There is also a incompletely imaged pancreatic body lesion on 125/2, on the order of 1.8 cm-similar. Suggestion of peripancreatic inflammation, especially about the pancreatic tail lesion on 113/2. Cholecystectomy. Musculoskeletal: Lower thoracic spondylosis. No sternal fracture or retrosternal fluid. IMPRESSION: 1.  No acute process in the chest. 2. Coronary artery atherosclerosis. Aortic Atherosclerosis (ICD10-I70.0). 3. Pancreatic cystic lesions, suboptimally and incompletely evaluated. Suspicion of peripancreatic edema. Correlate with symptoms to suggest acute on chronic pancreatitis. Consider follow-up with dedicated MRI or CT. 4. Chronic caudate lobe prominence, nonspecific. Correlate with any risk factors for cirrhosis. Electronically Signed   By: Abigail Miyamoto M.D.   On: 09/19/2019 12:28   Dg Chest Port 1 View  Result Date: 09/20/2019 CLINICAL DATA:  Intubation EXAM: PORTABLE CHEST 1  VIEW COMPARISON:  09/20/2019, 5:50 a.m. FINDINGS: Interval placement of endotracheal tube, tip positioned over the lower trachea, approximately 2 cm above the carina. Esophagogastric tube is position with tip and side port below the diaphragm. Mild cardiomegaly. Interval improvement in bilateral heterogeneous and interstitial opacity, most conspicuous in the right midlung. IMPRESSION: 1. Interval placement of endotracheal tube, tip positioned over the lower trachea, approximately 2 cm above the carina. Esophagogastric tube is position with tip and side port below the diaphragm. 2. Interval improvement in bilateral heterogeneous and interstitial opacity, most conspicuous in the right midlung, likely improved edema. Electronically Signed   By: Eddie Candle M.D.   On: 09/20/2019 11:20   Dg Chest Port 1 View  Result Date: 09/20/2019 CLINICAL DATA:  Intubated. Respiratory distress. EXAM: PORTABLE CHEST 1 VIEW COMPARISON:  09/05/2019 FINDINGS: The endotracheal tube tip is at the thoracic inlet. Borderline enlarged cardiac silhouette. Tortuous and calcified thoracic aorta. Extensive patchy bilateral airspace opacity, greater on the right. No pleural fluid. Thoracic spine degenerative changes. IMPRESSION: 1. Endotracheal tube tip at the thoracic inlet. It is recommended that this be advanced 4.3 cm. 2. Extensive patchy bilateral probable pneumonia. 3. Aortic atherosclerosis. Electronically Signed   By: Claudie Revering M.D.   On: 09/20/2019 06:41   Dg Chest Port 1 View  Result Date: 09/05/2019  CLINICAL DATA:  Shortness of breath EXAM: PORTABLE CHEST 1 VIEW COMPARISON:  05/23/2019 FINDINGS: Tortuosity of the thoracic aorta with diffuse calcifications. Aorta is normal size. Low lung volumes with mild peribronchial thickening and interstitial prominence. No effusions. No acute bony abnormality. IMPRESSION: Mild peribronchial thickening and interstitial prominence may reflect bronchitic changes. Electronically Signed    By: Rolm Baptise M.D.   On: 09/05/2019 17:12    Consults: Treatment Team:  Minna Merritts, MD Anthonette Legato, MD  Nephrology, Dr. Candiss Norse  Subjective:    Overnight Issues: Tolerated dialysis well.  This morning he is awake alert and following commands.  Passing SBT.  Plan for extubation.  Objective:  Vital signs for last 24 hours: Temp:  [98.3 F (36.8 C)-100.8 F (38.2 C)] 99.7 F (37.6 C) (11/02 0900) Pulse Rate:  [73-102] 73 (11/02 0900) Resp:  [17-30] 21 (11/02 0900) BP: (85-156)/(31-75) 140/49 (11/02 0900) SpO2:  [95 %-100 %] 100 % (11/02 0900) FiO2 (%):  [30 %-35 %] 30 % (11/01 2000) Weight:  [57 kg-59.9 kg] 59.9 kg (11/02 0437)  Hemodynamic parameters for last 24 hours:    Intake/Output from previous day: 11/01 0701 - 11/02 0700 In: 1106.6 [I.V.:743.6; Blood:363] Out: 2080 [Urine:80]  Intake/Output this shift: Total I/O In: 37.3 [I.V.:37.3] Out: 45 [Urine:45]  Vent settings for last 24 hours: FiO2 (%):  [30 %-35 %] 30 %  Physical Exam:  Constitutional: She appears well-developed and well-nourished. She appears ill. She is  awake and following commands  she is intubated.  Chronically ill appearing  HENT:  Head: Normocephalic and atraumatic.  Mouth/Throat: Mucous membranes are pale.  Orotracheally intubated.  Eyes: PERRL. Conjunctivae are normal. No scleral icterus.  Neck: Neck supple. No tracheal deviation present. Cardiovascular: Normal rate and regular rhythm.  Murmur heard.  Systolic murmur is present with a grade of 3/6.  More audible today. AS murmur. AV fistula on L forearm with good palpable thrill.  Respiratory: She is intubated. She has no wheezes.  Clear lung fields  GI: Soft. Bowel sounds are normal. She exhibits no distension.  Musculoskeletal: Normal range of motion.        General: No edema.  Neurological:   Awakens easily, follows commands, no overt focal deficit. Skin: Skin is warm and dry. There is pallor.  Psychiatric:    Intubated cannot assess.   Assessment/Plan:   1.  Acute hypoxic respiratory failure:  This is due to volume overload.  She has diastolic dysfunction and aortic stenosis classified as moderate by her last echo.  She has done well after dialysis and will proceed with extubation today.   2.  Acute on chronic diastolic heart failure: BNP 1, 516; likely cause of decompensation, she also has aortic stenosis which adds complexity to this issue.  Dialysis as needed.  She has seen Dr. Jacqulyn Cane in the past, will have cardiology reevaluate.  3.  End-stage renal disease:  Dialysis yesterday as per Dr. Candiss Norse  4.  Lactic acidosis due to hypoxemia: Resolved.  5.  Anemia of chronic kidney disease: H&H lower today at 7.3/21.5 we will transfuse 1 unit.  No evidence of bleeding.  May be dilutional due to volume overload  Best practice: GI prophylaxis with PPI, DVT prophylaxis with Heparin subcu. Ventilator discomfort: Propofol discontinued as is to be extubated.     LOS: 2 days       Critical care provider statement:    Critical care time (minutes):  33   Critical care time was exclusive  of:  Separately billable procedures and  treating other patients   Critical care was necessary to treat or prevent imminent or  life-threatening deterioration of the following conditions:  Acute hypoxemic respiratory failure, ESRD, acute on chronic diastolic chf, multiple comorbid conditions.    Critical care was time spent personally by me on the following  activities:  Development of treatment plan with patient or surrogate,  discussions with consultants, evaluation of patient's response to  treatment, examination of patient, obtaining history from patient or  surrogate, ordering and performing treatments and interventions, ordering  and review of laboratory studies and re-evaluation of patient's condition   I assumed direction of critical care for this patient from another  provider in my specialty:  no       Ottie Glazier, M.D.  Pulmonary [facility] [place]

## 2019-09-22 NOTE — Progress Notes (Addendum)
CRITICAL VALUE ALERT  Critical Value:  Troponin 211   Date & Time Notied:  09/22/2019 11:40  Provider Notified: Dr. Lanney Gins  Orders Received/Actions taken: N/A

## 2019-09-22 NOTE — Progress Notes (Signed)
[facility], [place] 09/22/19  Subjective:  Patient seen at bedside in the critical care unit. Patient had extra dialysis treatment yesterday. Starting to feel a bit better.    Objective:  Vital signs in last 24 hours:  Temp:  [98.3 F (36.8 C)-100.9 F (38.3 C)] 100.9 F (38.3 C) (11/02 1300) Pulse Rate:  [73-102] 96 (11/02 1300) Resp:  [17-30] 19 (11/02 1300) BP: (85-167)/(43-68) 167/59 (11/02 1300) SpO2:  [95 %-100 %] 97 % (11/02 1300) FiO2 (%):  [30 %-35 %] 30 % (11/01 2000) Weight:  [57 kg-59.9 kg] 59.9 kg (11/02 0437)  Weight change: -4.7 kg Filed Weights   09/21/19 1129 09/21/19 1508 09/22/19 0437  Weight: 59.2 kg 57 kg 59.9 kg    Intake/Output:    Intake/Output Summary (Last 24 hours) at 09/22/2019 1354 Last data filed at 09/22/2019 1300 Gross per 24 hour  Intake 475.93 ml  Output 2170 ml  Net -1694.07 ml     Physical Exam: General: No acute distress  HEENT  normocephalic, atraumatic, hearing intact  Pulm/lungs Coarse crackles b/l,  CVS/Heart Regular, no rubs  Abdomen:  Soft, non distended  Extremities: No edema  Neurologic: Alert, able to follow simple commands  Skin: No acute rashes, warm dry  Access: Left forearm AVF       Basic Metabolic Panel:  Recent Labs  Lab 09/20/19 0526 09/21/19 0435 09/21/19 0441 09/22/19 0752  NA 138 140  --  143  K 3.7 3.9  --  3.1*  CL 99 97*  --  100  CO2 25 26  --  30  GLUCOSE 190* 123*  --  95  BUN 49* 18  --  18  CREATININE 6.54* 3.05*  --  3.05*  CALCIUM 7.8* 8.4*  --  7.8*  MG  --   --  2.4 2.0  PHOS  --   --   --  2.5     CBC: Recent Labs  Lab 09/20/19 0526 09/21/19 0435 09/21/19 1459 09/21/19 2238 09/22/19 0752  WBC 15.1* 9.3  --  8.2 6.6  NEUTROABS 12.3*  --   --   --   --   HGB 7.9* 7.3* 8.0* 6.8* 8.0*  HCT 24.5* 21.5* 23.7* 21.2* 24.0*  MCV 92.5 88.1  --  89.5 88.2  PLT 206 126*  --  126* 107*      Lab Results  Component Value Date   HEPBSAG NON  REACTIVE 09/06/2019      Microbiology:  Recent Results (from the past 240 hour(s))  Culture, blood (routine x 2)     Status: None (Preliminary result)   Collection Time: 09/20/19  5:26 AM   Specimen: BLOOD  Result Value Ref Range Status   Specimen Description BLOOD RIGHT FOREARM  Final   Special Requests   Final    BOTTLES DRAWN AEROBIC AND ANAEROBIC Blood Culture adequate volume   Culture   Final    NO GROWTH 2 DAYS Performed at [facility], [address] [place], [place] [address]    Report Status PENDING  Incomplete  Culture, blood (routine x 2)     Status: None (Preliminary result)   Collection Time: 09/20/19  5:26 AM   Specimen: BLOOD  Result Value Ref Range Status   Specimen Description BLOOD RIGHT HAND  Final   Special Requests   Final    BOTTLES DRAWN AEROBIC AND ANAEROBIC Blood Culture adequate volume   Culture   Final    NO GROWTH  2 DAYS Performed at [facility], [address] [place], [place] [address]    Report Status PENDING  Incomplete  SARS Coronavirus 2 by RT PCR (hospital order, performed in [facility] hospital lab) Nasopharyngeal Nasopharyngeal Swab     Status: None   Collection Time: 09/20/19  5:26 AM   Specimen: Nasopharyngeal Swab  Result Value Ref Range Status   SARS Coronavirus 2 NEGATIVE NEGATIVE Final    Comment: (NOTE) If result is NEGATIVE SARS-CoV-2 target nucleic acids are NOT DETECTED. The SARS-CoV-2 RNA is generally detectable in upper and lower  respiratory specimens during the acute phase of infection. The lowest  concentration of SARS-CoV-2 viral copies this assay can detect is 250  copies / mL. A negative result does not preclude SARS-CoV-2 infection  and should not be used as the sole basis for treatment or other  patient management decisions.  A negative result may occur with  improper specimen collection / handling, submission of specimen other  than nasopharyngeal swab, presence of viral mutation(s)  within the  areas targeted by this assay, and inadequate number of viral copies  (<250 copies / mL). A negative result must be combined with clinical  observations, patient history, and epidemiological information. If result is POSITIVE SARS-CoV-2 target nucleic acids are DETECTED. The SARS-CoV-2 RNA is generally detectable in upper and lower  respiratory specimens dur ing the acute phase of infection.  Positive  results are indicative of active infection with SARS-CoV-2.  Clinical  correlation with patient history and other diagnostic information is  necessary to determine patient infection status.  Positive results do  not rule out bacterial infection or co-infection with other viruses. If result is PRESUMPTIVE POSTIVE SARS-CoV-2 nucleic acids MAY BE PRESENT.   A presumptive positive result was obtained on the submitted specimen  and confirmed on repeat testing.  While 2019 novel coronavirus  (SARS-CoV-2) nucleic acids may be present in the submitted sample  additional confirmatory testing may be necessary for epidemiological  and / or clinical management purposes  to differentiate between  SARS-CoV-2 and other Sarbecovirus currently known to infect humans.  If clinically indicated additional testing with an alternate test  methodology [phone]) is advised. The SARS-CoV-2 RNA is generally  detectable in upper and lower respiratory sp ecimens during the acute  phase of infection. The expected result is Negative. Fact Sheet for Patients:  StrictlyIdeas.no Fact Sheet for Healthcare Providers: BankingDealers.co.za This test is not yet approved or cleared by the [place] FDA and has been authorized for detection and/or diagnosis of SARS-CoV-2 by FDA under an Emergency Use Authorization (EUA).  This EUA will remain in effect (meaning this test can be used) for the duration of the COVID-19 declaration under Section 564(b)(1) of the Act,  21 U.S.C. section 360bbb-3(b)(1), unless the authorization is terminated or revoked sooner. Performed at [facility], [address] [place],  [address]   MRSA PCR Screening     Status: None   Collection Time: 09/20/19 10:12 AM   Specimen: Nasopharyngeal  Result Value Ref Range Status   MRSA by PCR NEGATIVE NEGATIVE Final    Comment:        The GeneXpert MRSA Assay (FDA approved for NASAL specimens only), is one component of a comprehensive MRSA colonization surveillance program. It is not intended to diagnose MRSA infection nor to guide or monitor treatment for MRSA infections. Performed at [facility], [address], [place],  [address]     Coagulation Studies: Recent  Labs    09/21/19 2238  LABPROT 15.1  INR 1.2    Urinalysis: No results for input(s): COLORURINE, LABSPEC, PHURINE, GLUCOSEU, HGBUR, BILIRUBINUR, KETONESUR, PROTEINUR, UROBILINOGEN, NITRITE, LEUKOCYTESUR in the last 72 hours.  Invalid input(s): APPERANCEUR    Imaging: No results found.   Medications:   . nitroGLYCERIN 60 mcg/min (09/22/19 1245)   . sodium chloride   Intravenous Once  . Chlorhexidine Gluconate Cloth  6 each Topical Q0600  . epoetin (EPOGEN/PROCRIT) injection  4,000 Units Intravenous Q T,Th,Sa-HD  . insulin aspart  0-9 Units Subcutaneous Q4H  . mouth rinse  15 mL Mouth Rinse BID  . pantoprazole (PROTONIX) IV  40 mg Intravenous Daily     Assessment/ Plan:  73 y.o. female with end stage renal disease on hemodialysis, hypertension, diabetes mellitus type II, overactive bladder, gout, COPD, congestive heart failure, coronary artery disease, aortic atherosclerosis  Active Problems:   Acute respiratory failure with hypoxemia ([place])  [facility] //TuThSa-2// TW 60kg  #. ESRD with acute resp failure -Patient completed dialysis yesterday.  No urgent indication for dialysis today.  We will plan for dialysis again  tomorrow.   #. Anemia of CKD  Lab Results  Component Value Date   HGB 8.0 (L) 09/22/2019  Patient received blood transfusion this admission.  Otherwise maintain the patient on Epogen 4000 units IV with dialysis on Tuesday, Thursday, Saturday.   #. SHPTH     Component Value Date/Time   PTH 160 (H) 05/24/2019 0413   Lab Results  Component Value Date   PHOS 2.5 09/22/2019  Phosphorus currently at target at 2.5.  Continue to periodically monitor bone mineral metabolism parameters.       LOS: 2 Phyllis [name] 11/2/20201:54 PM  [facility], [place]

## 2019-09-22 NOTE — Progress Notes (Signed)
Progress Note  [name] Date of Encounter: 09/22/2019  Primary Cardiologist: Ida Rogue, MD   Subjective   The patient presented with flash pulmonary edema and underwent urgent dialysis.  She was extubated yesterday.  She has been complaining of sharp chest discomfort all day long.  Her blood pressure has been elevated.  All antihypertensive medications have been on hold since admission including carvedilol, amlodipine and hydralazine.  She is currently on a nitroglycerin drip. She is oozing from IV sites and platelet count is dropping.  Inpatient Medications    Scheduled Meds: . sodium chloride   Intravenous Once  . amLODipine  10 mg Oral Daily  . aspirin  81 mg Oral Daily  . carvedilol  12.5 mg Oral BID WC  . Chlorhexidine Gluconate Cloth  6 each Topical Q0600  . epoetin (EPOGEN/PROCRIT) injection  4,000 Units Intravenous Q T,Th,Sa-HD  . insulin aspart  0-9 Units Subcutaneous Q4H  . mouth rinse  15 mL Mouth Rinse BID  . pantoprazole (PROTONIX) IV  40 mg Intravenous Daily   Continuous Infusions: . sodium chloride Stopped (09/22/19 1640)  . desmopressin (DDAVP) IV 18 mcg (09/22/19 1640)  . nitroGLYCERIN 75 mcg/min (09/22/19 1640)   PRN Meds: sodium chloride, acetaminophen, albuterol, guaiFENesin-dextromethorphan, HYDROcodone-acetaminophen, midazolam, morphine injection   Vital Signs    Vitals:   09/22/19 1400 09/22/19 1415 09/22/19 1500 09/22/19 1600  BP: (!) 144/63 (!) 142/55 (!) 149/57 (!) 155/65  Pulse: 96 91 91 (!) 106  Resp: (!) 32 (!) 26 (!) 29 (!) 32  Temp: (!) 100.9 F (38.3 C) (!) 100.6 F (38.1 C) (!) 100.4 F (38 C) (!) 100.6 F (38.1 C)  TempSrc: Bladder  Bladder   SpO2: 95% 96% 97% 95%  Weight:      Height:        Intake/Output Summary (Last 24 hours) at 09/22/2019 1647 Last data filed at 09/22/2019 1640 Gross per 24 hour  Intake 484.57 ml  Output 320 ml  Net 164.57 ml   Last 3 Weights 09/22/2019 09/21/2019 09/21/2019   Weight (lbs) 132 lb 0.9 oz 125 lb 10.6 oz 130 lb 8.2 oz  Weight (kg) 59.9 kg 57 kg 59.2 kg      Telemetry    Normal sinus rhythm with intermittent SVT- Personally Reviewed  ECG    Normal sinus rhythm with minor ST elevation in aVR with generalized ST depression.  This is improved from EKG on presentation- Personally Reviewed  Physical Exam   GEN:  Critically ill-appearing Neck: No JVD Cardiac: RRR, no  rubs, or gallops.  2 out of 6 systolic murmur in the aortic area which is mid peaking. Respiratory: Clear to auscultation bilaterally. GI: Soft, nontender, non-distended  MS: No edema; No deformity. Neuro:  Nonfocal  Psych: Normal affect   Labs    High Sensitivity Troponin:   Recent Labs  Lab 09/21/19 0945 09/21/19 1424 09/21/19 1620 09/22/19 1054 09/22/19 1238  TROPONINIHS 81* 109* 114* 211* 204*      Chemistry Recent Labs  Lab 09/20/19 0526 09/21/19 0435 09/22/19 0752  NA 138 140 143  K 3.7 3.9 3.1*  CL 99 97* 100  CO2 25 26 30   GLUCOSE 190* 123* 95  BUN 49* 18 18  CREATININE 6.54* 3.05* 3.05*  CALCIUM 7.8* 8.4* 7.8*  PROT 6.8  --  5.7*  ALBUMIN 3.9  --  3.3*  AST 16  --  15  ALT 11  --  12  ALKPHOS 68  --  50  BILITOT 0.7  --  1.3*  GFRNONAA 6* 15* 15*  GFRAA 7* 17* 17*  ANIONGAP 14 17* 13     Hematology Recent Labs  Lab 10/05/2019 0435 05-Oct-2019 1459 October 05, 2019 2238 09/22/19 0752  WBC 9.3  --  8.2 6.6  RBC 2.44*  --  2.37* 2.72*  HGB 7.3* 8.0* 6.8* 8.0*  HCT 21.5* 23.7* 21.2* 24.0*  MCV 88.1  --  89.5 88.2  MCH 29.9  --  28.7 29.4  MCHC 34.0  --  32.1 33.3  RDW 14.0  --  15.9* 15.9*  PLT 126*  --  126* 107*    BNP Recent Labs  Lab 09/20/19 0526  BNP 1,516.0*     DDimer No results for input(s): DDIMER in the last 168 hours.   Radiology    No results found.  Cardiac Studies   Echocardiogram done on 10/05/2023.   1. Left ventricular ejection fraction, by visual estimation, is 60 to 65%. The left ventricle has normal function.  There is mildly increased left ventricular hypertrophy.  2. Global right ventricle has normal systolic function.The right ventricular size is normal. No increase in right ventricular wall thickness.  3. Left atrial size was mild-moderately dilated.  4. Mild mitral valve regurgitation.  5. The tricuspid valve is normal in structure. Tricuspid valve regurgitation is not demonstrated.  6. The aortic valve was not well visualized. Measurements as detailed below, Moderate aortic valve stenosis by gradients, severe stenosis by estimated AVA.  7. TR signal is inadequate for assessing pulmonary artery systolic pressure.  8. Aortic valve mean gradient measures 20.6 mmHg.  9. Aortic valve peak gradient measures 32.0 mmHg. 10. Aortic valve area, by VTI measures 0.68 cm.  Patient Profile     73 y.o. female with history of aortic stenosis, end-stage renal disease on hemodialysis, diffuse aortic atherosclerosis, essential hypertension and hyperlipidemia who presented with flash pulmonary edema and required intubation.  Status post extubation yesterday.  Assessment & Plan    1.  Flash pulmonary edema: Likely multifactorial in the setting of end-stage renal disease, underlying aortic stenosis, worsening anemia and possible coronary ischemia. I agree that the patient would benefit from a right and left cardiac catheterization during this admission.  However, I do not think she is an optimal clinical condition right now especially with continued anemia, oozing from IV sites and dropping platelet count.  I think she will be a high risk for bleeding complication.  2.  Possible unstable angina: Troponin was elevated in the 200 range.  EKG at some point was very impressive with ST elevation in aVR with diffuse ST depression.  Subsequently this has improved and most recent troponin decreased.  I do suspect that she has significant underlying coronary artery disease.  I added low-dose aspirin and resume carvedilol and  amlodipine.  3.  Aortic valve stenosis: Moderate by gradient and severe by aortic valve area.  Possible low-flow low gradient severe aortic stenosis.  I do think that she is a poor surgical candidate.  In addition, she is high risk even for TAVR.  4.  Anemia: Hemoglobin posttransfusion was 8.  Consider transfusion to hemoglobin above 9 given underlying aortic stenosis and heart failure.  5.  End-stage renal disease on hemodialysis.  Given her multiple comorbidities, consider palliative care consult to clarify goals of treatment.       For questions or updates, please contact [place] Please consult [email or web address] for contact info under  Signed, Kathlyn Sacramento, MD  09/22/2019, 4:47 PM

## 2019-09-22 NOTE — Progress Notes (Signed)
Pt progressively declining through shift. Pt is tachypneic, tachycardic, febrile and hypoxic. Sepsis workup in progress. Oozing from insertion sites. MD aware continuing to monitor.

## 2019-09-22 NOTE — Progress Notes (Signed)
VAST Nurse at bedside at this time and informed Nurse Vivien Rota that pt. Has crea clearance of 44ml/min and that Midline is not suitable for the patient at this time. Nurse stated that she will just put an ultra  sound PIV herself  to patient and not needed VAST IV Nurse . Consult to be discontinued.

## 2019-09-23 DIAGNOSIS — A419 Sepsis, unspecified organism: Secondary | ICD-10-CM

## 2019-09-23 DIAGNOSIS — R6521 Severe sepsis with septic shock: Secondary | ICD-10-CM

## 2019-09-23 LAB — BPAM PLATELET PHERESIS
Blood Product Expiration Date: 202011032359
ISSUE DATE / TIME: 202011021828
Unit Type and Rh: 5100

## 2019-09-23 LAB — BLOOD GAS, ARTERIAL
Acid-Base Excess: 8 mmol/L — ABNORMAL HIGH (ref 0.0–2.0)
Bicarbonate: 32.8 mmol/L — ABNORMAL HIGH (ref 20.0–28.0)
Delivery systems: POSITIVE
Expiratory PAP: 5
FIO2: 0.36
Inspiratory PAP: 18
O2 Saturation: 99.3 %
Patient temperature: 37
RATE: 12 resp/min
pCO2 arterial: 45 mmHg (ref 32.0–48.0)
pH, Arterial: 7.47 — ABNORMAL HIGH (ref 7.350–7.450)
pO2, Arterial: 143 mmHg — ABNORMAL HIGH (ref 83.0–108.0)

## 2019-09-23 LAB — PREPARE FRESH FROZEN PLASMA: Unit division: 0

## 2019-09-23 LAB — PREPARE PLATELET PHERESIS: Unit division: 0

## 2019-09-23 LAB — BPAM FFP
Blood Product Expiration Date: 202011072359
ISSUE DATE / TIME: 202011020512
Unit Type and Rh: 5100

## 2019-09-23 LAB — CBC
HCT: 23.6 % — ABNORMAL LOW (ref 36.0–46.0)
Hemoglobin: 7.6 g/dL — ABNORMAL LOW (ref 12.0–15.0)
MCH: 28.8 pg (ref 26.0–34.0)
MCHC: 32.2 g/dL (ref 30.0–36.0)
MCV: 89.4 fL (ref 80.0–100.0)
Platelets: 105 10*3/uL — ABNORMAL LOW (ref 150–400)
RBC: 2.64 MIL/uL — ABNORMAL LOW (ref 3.87–5.11)
RDW: 15.2 % (ref 11.5–15.5)
WBC: 7.2 10*3/uL (ref 4.0–10.5)
nRBC: 0 % (ref 0.0–0.2)

## 2019-09-23 LAB — BASIC METABOLIC PANEL
Anion gap: 16 — ABNORMAL HIGH (ref 5–15)
BUN: 33 mg/dL — ABNORMAL HIGH (ref 8–23)
CO2: 28 mmol/L (ref 22–32)
Calcium: 7.6 mg/dL — ABNORMAL LOW (ref 8.9–10.3)
Chloride: 99 mmol/L (ref 98–111)
Creatinine, Ser: 4.57 mg/dL — ABNORMAL HIGH (ref 0.44–1.00)
GFR calc Af Amer: 10 mL/min — ABNORMAL LOW (ref >60)
GFR calc non Af Amer: 9 mL/min — ABNORMAL LOW (ref >60)
Glucose, Bld: 122 mg/dL — ABNORMAL HIGH (ref 70–99)
Potassium: 3.7 mmol/L (ref 3.5–5.1)
Sodium: 143 mmol/L (ref 135–145)

## 2019-09-23 LAB — GLUCOSE, CAPILLARY
Glucose-Capillary: 113 mg/dL — ABNORMAL HIGH (ref 70–99)
Glucose-Capillary: 146 mg/dL — ABNORMAL HIGH (ref 70–99)
Glucose-Capillary: 79 mg/dL (ref 70–99)
Glucose-Capillary: 93 mg/dL (ref 70–99)
Glucose-Capillary: 99 mg/dL (ref 70–99)

## 2019-09-23 LAB — PROCALCITONIN: Procalcitonin: 4.19 ng/mL

## 2019-09-23 LAB — MAGNESIUM: Magnesium: 1.9 mg/dL (ref 1.7–2.4)

## 2019-09-23 MED ORDER — INSULIN ASPART 100 UNIT/ML ~~LOC~~ SOLN
0.0000 [IU] | Freq: Three times a day (TID) | SUBCUTANEOUS | Status: DC
  Administered 2019-09-24 (×2): 1 [IU] via SUBCUTANEOUS
  Filled 2019-09-23 (×4): qty 1

## 2019-09-23 MED ORDER — FUROSEMIDE 10 MG/ML IJ SOLN: 80.0000 mg | Freq: Two times a day (BID) | INTRAMUSCULAR | Status: DC

## 2019-09-23 MED ORDER — INSULIN ASPART 100 UNIT/ML ~~LOC~~ SOLN: 0.0000 [IU] | Freq: Every day | SUBCUTANEOUS | Status: DC

## 2019-09-23 MED ORDER — CHLORHEXIDINE GLUCONATE 0.12 % MT SOLN
OROMUCOSAL | Status: AC
  Administered 2019-09-23: 15 mL
  Filled 2019-09-23: qty 15

## 2019-09-23 MED ORDER — FUROSEMIDE 10 MG/ML IJ SOLN
80.0000 mg | Freq: Two times a day (BID) | INTRAMUSCULAR | Status: DC
  Administered 2019-09-23: 80 mg via INTRAVENOUS
  Filled 2019-09-23: qty 8

## 2019-09-23 NOTE — Progress Notes (Signed)
Progress Note  [name] Date of Encounter: 09/23/2019  Primary Cardiologist: Ida Rogue, MD   Subjective   Patient very somnolent overnight and this morning.  She denies chest pain and shortness of breath but is unable to answer further questions.  Inpatient Medications    Scheduled Meds: . sodium chloride   Intravenous Once  . amLODipine  10 mg Oral Daily  . aspirin  81 mg Oral Daily  . carvedilol  12.5 mg Oral BID WC  . Chlorhexidine Gluconate Cloth  6 each Topical Q0600  . epoetin (EPOGEN/PROCRIT) injection  4,000 Units Intravenous Q T,Th,Sa-HD  . insulin aspart  0-9 Units Subcutaneous Q4H  . mouth rinse  15 mL Mouth Rinse BID  . pantoprazole (PROTONIX) IV  40 mg Intravenous Daily   Continuous Infusions: . sodium chloride Stopped (09/22/19 1822)  . azithromycin Stopped (09/22/19 2111)  . cefTRIAXone (ROCEPHIN)  IV 1 g (09/22/19 1859)  . nitroGLYCERIN 40 mcg/min (09/23/19 0600)   PRN Meds: sodium chloride, acetaminophen, albuterol, guaiFENesin-dextromethorphan, HYDROcodone-acetaminophen, midazolam, morphine injection   Vital Signs    Vitals:   09/23/19 0545 09/23/19 0600 09/23/19 0615 09/23/19 0700  BP: (!) 111/48 (!) 133/50 (!) 129/51 (!) 129/56  Pulse: 66 67 72 72  Resp: 15 17 14 15   Temp: 98.1 F (36.7 C) 98.1 F (36.7 C) 98.2 F (36.8 C) 98.2 F (36.8 C)  TempSrc:      SpO2: 100% 100% 100% 100%  Weight:      Height:        Intake/Output Summary (Last 24 hours) at 09/23/2019 0845 Last data filed at 09/23/2019 0600 Gross per 24 hour  Intake 1077.33 ml  Output 430 ml  Net 647.33 ml   Last 3 Weights 09/23/2019 09/22/2019 09/21/2019  Weight (lbs) 136 lb 14.5 oz 132 lb 0.9 oz 125 lb 10.6 oz  Weight (kg) 62.1 kg 59.9 kg 57 kg      Telemetry    NSR with single atrial run (19 beats) - Personally Reviewed  ECG    09/22/2019: NSR with LVH and anterior as well as inferolateral TWI - Personally Reviewed  Physical Exam   GEN:  Somnolent but in no distress.  BiPAP in place.   Neck: JVP difficult to assess due to BiPAP stap. Cardiac: Distant heart sounds.  RRR with 2/6 systolic murmur.  Respiratory: Coarse breath sounds; unable to auscultate posteriorly, as patient cannot reposition in bed. GI: Soft, nontender, non-distended  MS: No edema; No deformity. Neuro:  Somnolent; arouses briefly to voice. Psych: Unable to assess due to lethargy.  Labs    High Sensitivity Troponin:   Recent Labs  Lab 09/21/19 1424 09/21/19 1620 09/22/19 1054 09/22/19 1238 09/22/19 1853  TROPONINIHS 109* 114* 211* 204* 185*      Chemistry Recent Labs  Lab 09/20/19 0526 09/21/19 0435 09/22/19 0752 09/23/19 0346  NA 138 140 143 143  K 3.7 3.9 3.1* 3.7  CL 99 97* 100 99  CO2 25 26 30 28   GLUCOSE 190* 123* 95 122*  BUN 49* 18 18 33*  CREATININE 6.54* 3.05* 3.05* 4.57*  CALCIUM 7.8* 8.4* 7.8* 7.6*  PROT 6.8  --  5.7*  --   ALBUMIN 3.9  --  3.3*  --   AST 16  --  15  --   ALT 11  --  12  --   ALKPHOS 68  --  44  --   BILITOT 0.7  --  1.3*  --  GFRNONAA 6* 15* 15* 9*  GFRAA 7* 17* 17* 10*  ANIONGAP 14 17* 13 16*     Hematology Recent Labs  Lab 09/22/19 0752 09/22/19 1645 09/23/19 0346  WBC 6.6 9.1 7.2  RBC 2.72* 2.90* 2.64*  HGB 8.0* 8.5* 7.6*  HCT 24.0* 26.0* 23.6*  MCV 88.2 89.7 89.4  MCH 29.4 29.3 28.8  MCHC 33.3 32.7 32.2  RDW 15.9* 15.8* 15.2  PLT 107* 108* 105*    BNP Recent Labs  Lab 09/20/19 0526  BNP 1,516.0*     DDimer No results for input(s): DDIMER in the last 168 hours.   Radiology    Dg Chest Port 1 View  Result Date: 09/22/2019 CLINICAL DATA:  Pulmonary disease EXAM: PORTABLE CHEST 1 VIEW COMPARISON:  09/20/2019 FINDINGS: Bilateral diffuse interstitial thickening. No pleural effusion or pneumothorax. No focal consolidation. Stable cardiomediastinal silhouette. Thoracic aortic atherosclerosis. No acute osseous abnormality. IMPRESSION: Diffuse bilateral interstitial thickening likely  reflecting chronic interstitial disease with possible superimposed interstitial edema or infection. Electronically Signed   By: Kathreen Devoid   On: 09/22/2019 18:04    Cardiac Studies   TTE (09/21/2019):  1. Left ventricular ejection fraction, by visual estimation, is 60 to 65%. The left ventricle has normal function. There is mildly increased left ventricular hypertrophy.  2. Global right ventricle has normal systolic function.The right ventricular size is normal. No increase in right ventricular wall thickness.  3. Left atrial size was mild-moderately dilated.  4. Mild mitral valve regurgitation.  5. The tricuspid valve is normal in structure. Tricuspid valve regurgitation is not demonstrated.  6. The aortic valve was not well visualized. Measurements as detailed below, Moderate aortic valve stenosis by gradients, severe stenosis by estimated AVA.  7. TR signal is inadequate for assessing pulmonary artery systolic pressure.  8. Aortic valve mean gradient measures 20.6 mmHg.  9. Aortic valve peak gradient measures 32.0 mmHg. 10. Aortic valve area, by VTI measures 0.68 cm.  Patient Profile     73 y.o. female history of aortic stenosis, Anab Vivar-stage renal disease on hemodialysis, diffuse aortic atherosclerosis, essential hypertension and hyperlipidemia who presented with flash pulmonary edema and required intubation, now extubated on BiPAP.  Assessment & Plan    Acute on chronic HFpEF: Patient developed flash pulmonary edema, likely due to diastolic dysfunction, moderate to severe AS, and ESRD.  Coronary ischemia may be contributing.  She required intubation but has been extubated.  Volume assessment is challenging by exam; patient is roughly net even during this admission.  Recommend volume removal via HD, as tolerated.  Wean NTG as blood pressure tolerates.  Continue amlodipine and carvedilol.  Aortic stenosis: Moderate to severe by echo.  As noted by Dr. Fletcher Anon yesterday, Phyllis [name] is a poor candidate for aortic valve intervention given her multiple comorbidities.  Unstable angina: Patient reportedly had chest pain but denies discomfort at this time on NTG infusion.  Wean NTG infusion, as tolerated.  Could add isosorbide mononitrate 15 mg daily if the patient has recurrent chest pain.  Consider PRBC, given hemoglobin of 7.6 that would worsen supply-demand mismatch.  No plan for cardiac catheterization, given critical illness with worsening multiple comorbidities including worsening anemia, thrombocytopenia, fever up to 102.6 degrees Fahrenheit, and tenuous respiratory status.  ESRD:  Continue management per nephrology.  Sepsis: Concern for sepsis due to worsening multiorgan dysfunction and fever yesterday.  Continue ceftriaxone and azithromycin.  If patient decompensates further, broadening of antimicrobial therapy may need to be considered.  For questions or  updates, please contact [place] Please consult [email or web address] for contact info under [facility].  Signed, [name], MD  09/23/2019, 8:45 AM

## 2019-09-23 NOTE — Progress Notes (Signed)
   09/23/19 1500  Neurological  Level of Consciousness Alert  Orientation Level Oriented to person;Oriented to situation;Oriented to place  Respiratory  Respiratory Pattern Regular;Unlabored  Bilateral Breath Sounds Diminished;Clear  Cardiac  Pulse Regular  ECG Monitor Yes  pt in bed no c/os no distress noted stable for HD TX avf +/+ ufg 2L

## 2019-09-23 NOTE — Progress Notes (Signed)
Notified Dr A regarding patients BP 150-170's. Unable to give patient PO's due to bi-pap/lethargy. Coreg was given earlier. At this point per Dr. Delilah Shan. no PRN at this point for BP.

## 2019-09-23 NOTE — Progress Notes (Signed)
Pharmacy Electrolyte Monitoring Consult:  Phyllis [name] is a 65 YOF admitted on 09/20/2019 with respiratory distress. She is a lifelong never smoker with ESRD on HD (Tues, Thurs, Sat). Patient presented with acute hypoxic respiratory failure due to volume overload.  She was admitted to the ICU, intubated, and mechanically ventilated. Emergency dialysis ensued.   Patient was dialyzed today and is to start receiving Lasix 80mg  IV BID. Pharmacy was consulted to assist in monitoring and replacing electrolytes.  Labs:  Sodium (mmol/L)  Date Value  09/23/2019 143  12/10/2014 142   Potassium (mmol/L)  Date Value  09/23/2019 3.7  12/10/2014 3.6   Magnesium (mg/dL)  Date Value  09/23/2019 1.9   Phosphorus (mg/dL)  Date Value  09/22/2019 2.5  12/09/2014 4.4   Calcium (mg/dL)  Date Value  09/23/2019 7.6 (L)   Calcium, Total (mg/dL)  Date Value  12/10/2014 8.6   Albumin (g/dL)  Date Value  09/22/2019 3.3 (L)  12/07/2014 2.5 (L)   Corrected Calcium: 8.2 mg/dL  Electrolytes:  Assessment/Plan: - No electrolyte replacement warranted at this time. - Will check AM labs and supplement accordingly.  Goals:  - Mg ~ 2 - K ~ 4  Thank you for allowing pharmacy to be a part of this patient's care.  Raiford Simmonds, PharmD Candidate 09/23/2019 1:24 PM

## 2019-09-23 NOTE — Progress Notes (Signed)
Established hemodialysis patient known at [facility] TTS 11:45, patient transports self to treatments. Please contact me with any dialysis placement concerns.  Elvera Bicker Dialysis Coordinator [phone]

## 2019-09-23 NOTE — Progress Notes (Signed)
Follow up - Critical Care Medicine Note  Patient Details:    Phyllis [name] is an 73 y.o. female lifelong never smoker with ESRD on hemodialysis Tuesday Thursday Saturday and history as noted below presented with acute hypoxic respiratory failure due to volume overload.    Patient was admitted to the ICU intubated and mechanically ventilated.  Emergent dialysis ensued.  11/2 -Chest pain this morning despite nitroglycerine, cough worsening will start morphine.   11/3 patient febrile overnight, septic workup done, on empiric abx , abnormal cxr, elevated PCT  Lines, Airways, Drains: Urethral Catheter Kandis Nab, RN Latex 16 Fr. (Active)  Indication for Insertion or Continuance of Catheter Unstable critically ill patients first 24-48 hours (See Criteria) 09/21/19 1209  Site Assessment Clean;Intact 09/21/19 1209  Catheter Maintenance Bag below level of bladder;Drainage bag/tubing not touching floor;No dependent loops 09/21/19 1209  Collection Container Standard drainage bag 09/21/19 1209  Securement Method Securing device (Describe) 09/21/19 1209  Urinary Catheter Interventions (if applicable) Unclamped [id number] 1209  Output (mL) 75 mL 09/21/19 0500    Anti-infectives:  Anti-infectives (From admission, onward)   Start     Dose/Rate Route Frequency Ordered Stop   09/22/19 1800  cefTRIAXone (ROCEPHIN) 1 g in sodium chloride 0.9 % 100 mL IVPB     1 g 200 mL/hr over 30 Minutes Intravenous Every 24 hours 09/22/19 1740     09/22/19 1800  azithromycin (ZITHROMAX) 500 mg in sodium chloride 0.9 % 250 mL IVPB     500 mg 250 mL/hr over 60 Minutes Intravenous Every 24 hours 09/22/19 1740     09/21/19 1000  cefTRIAXone (ROCEPHIN) 2 g in sodium chloride 0.9 % 100 mL IVPB  Status:  Discontinued     2 g 200 mL/hr over 30 Minutes Intravenous Every 24 hours 09/20/19 0833 09/21/19 0936   09/21/19 1000  azithromycin (ZITHROMAX) 500 mg in sodium chloride 0.9 % 250 mL IVPB  Status:  Discontinued     500  mg 250 mL/hr over 60 Minutes Intravenous Every 24 hours 09/20/19 0833 09/21/19 0936   09/20/19 0700  cefTRIAXone (ROCEPHIN) 2 g in sodium chloride 0.9 % 100 mL IVPB  Status:  Discontinued     2 g 200 mL/hr over 30 Minutes Intravenous Every 24 hours 09/20/19 0650 09/20/19 0958   09/20/19 0700  azithromycin (ZITHROMAX) 500 mg in sodium chloride 0.9 % 250 mL IVPB  Status:  Discontinued     500 mg 250 mL/hr over 60 Minutes Intravenous Every 24 hours 09/20/19 0650 09/20/19 0957      Microbiology: Results for orders placed or performed during the hospital encounter of 09/20/19  Culture, blood (routine x 2)     Status: None (Preliminary result)   Collection Time: 09/20/19  5:26 AM   Specimen: BLOOD  Result Value Ref Range Status   Specimen Description BLOOD RIGHT FOREARM  Final   Special Requests   Final    BOTTLES DRAWN AEROBIC AND ANAEROBIC Blood Culture adequate volume   Culture   Final    NO GROWTH 3 DAYS Performed at [facility], [address] [place], [place] [address]    Report Status PENDING  Incomplete  Culture, blood (routine x 2)     Status: None (Preliminary result)   Collection Time: 09/20/19  5:26 AM   Specimen: BLOOD  Result Value Ref Range Status   Specimen Description BLOOD RIGHT HAND  Final   Special Requests   Final    BOTTLES DRAWN AEROBIC AND ANAEROBIC  Blood Culture adequate volume   Culture   Final    NO GROWTH 3 DAYS Performed at [facility], [address] [place], [place] [address]    Report Status PENDING  Incomplete  SARS Coronavirus 2 by RT PCR (hospital order, performed in [facility] hospital lab) Nasopharyngeal Nasopharyngeal Swab     Status: None   Collection Time: 09/20/19  5:26 AM   Specimen: Nasopharyngeal Swab  Result Value Ref Range Status   SARS Coronavirus 2 NEGATIVE NEGATIVE Final    Comment: (NOTE) If result is NEGATIVE SARS-CoV-2 target nucleic acids are NOT DETECTED. The SARS-CoV-2 RNA is generally detectable in  upper and lower  respiratory specimens during the acute phase of infection. The lowest  concentration of SARS-CoV-2 viral copies this assay can detect is 250  copies / mL. A negative result does not preclude SARS-CoV-2 infection  and should not be used as the sole basis for treatment or other  patient management decisions.  A negative result may occur with  improper specimen collection / handling, submission of specimen other  than nasopharyngeal swab, presence of viral mutation(s) within the  areas targeted by this assay, and inadequate number of viral copies  (<250 copies / mL). A negative result must be combined with clinical  observations, patient history, and epidemiological information. If result is POSITIVE SARS-CoV-2 target nucleic acids are DETECTED. The SARS-CoV-2 RNA is generally detectable in upper and lower  respiratory specimens dur ing the acute phase of infection.  Positive  results are indicative of active infection with SARS-CoV-2.  Clinical  correlation with patient history and other diagnostic information is  necessary to determine patient infection status.  Positive results do  not rule out bacterial infection or co-infection with other viruses. If result is PRESUMPTIVE POSTIVE SARS-CoV-2 nucleic acids MAY BE PRESENT.   A presumptive positive result was obtained on the submitted specimen  and confirmed on repeat testing.  While 2019 novel coronavirus  (SARS-CoV-2) nucleic acids may be present in the submitted sample  additional confirmatory testing may be necessary for epidemiological  and / or clinical management purposes  to differentiate between  SARS-CoV-2 and other Sarbecovirus currently known to infect humans.  If clinically indicated additional testing with an alternate test  methodology [id number]) is advised. The SARS-CoV-2 RNA is generally  detectable in upper and lower respiratory sp ecimens during the acute  phase of infection. The expected result is  Negative. Fact Sheet for Patients:  StrictlyIdeas.no Fact Sheet for Healthcare Providers: BankingDealers.co.za This test is not yet approved or cleared by the [place] FDA and has been authorized for detection and/or diagnosis of SARS-CoV-2 by FDA under an Emergency Use Authorization (EUA).  This EUA will remain in effect (meaning this test can be used) for the duration of the COVID-19 declaration under Section 564(b)(1) of the Act, 21 U.S.C. section 360bbb-3(b)(1), unless the authorization is terminated or revoked sooner. Performed at [facility], [address] [place],  [address]   MRSA PCR Screening     Status: None   Collection Time: 09/20/19 10:12 AM   Specimen: Nasopharyngeal  Result Value Ref Range Status   MRSA by PCR NEGATIVE NEGATIVE Final    Comment:        The GeneXpert MRSA Assay (FDA approved for NASAL specimens only), is one component of a comprehensive MRSA colonization surveillance program. It is not intended to diagnose MRSA infection nor to guide or monitor treatment for MRSA infections. Performed at [facility], [place], [place] [address]   CULTURE, BLOOD (ROUTINE X 2) w Reflex to ID Panel     Status: None (Preliminary result)   Collection Time: 09/22/19  6:30 PM   Specimen: BLOOD  Result Value Ref Range Status   Specimen Description BLOOD BLOOD LEFT HAND  Final   Special Requests   Final    BOTTLES DRAWN AEROBIC ONLY Blood Culture results may not be optimal due to an inadequate volume of blood received in culture bottles   Culture   Final    NO GROWTH < 12 HOURS Performed at [facility], [address], [place], [place] [address]    Report Status PENDING  Incomplete  CULTURE, BLOOD (ROUTINE X 2) w Reflex to ID Panel     Status: None (Preliminary result)   Collection Time: 09/22/19  6:53 PM   Specimen: BLOOD  Result Value Ref Range Status    Specimen Description BLOOD BLOOD RIGHT HAND  Final   Special Requests   Final    BOTTLES DRAWN AEROBIC AND ANAEROBIC Blood Culture adequate volume   Culture   Final    NO GROWTH < 12 HOURS Performed at [facility], [address] [place], [place] [address]    Report Status PENDING  Incomplete    Best Practice/Protocols:  VTE Prophylaxis: Heparin (SQ) GI Prophylaxis: Proton Pump Inhibitor Ventilator weaning  Events: 10/31 admitted to ICU with volume overload, intubated, mechanically ventilated, emergent dialysis done. 11/01 SAT passed.  SBT passed.  Extubated 11/2 - complains of chest pain despite nitroglycerine troponin is uptrending.    Studies: Dg Abd 1 View  Result Date: 09/20/2019 CLINICAL DATA:  OG tube placement EXAM: ABDOMEN - 1 VIEW COMPARISON:  None. FINDINGS: OG tube appears adequately positioned in the stomach with proximal sideholes just below the level of the gastroesophageal junction. Visualized bowel gas pattern is nonobstructive. No evidence of free intraperitoneal air or abnormal fluid collection. Aortic atherosclerosis. IMPRESSION: 1. OG tube appears adequately positioned in the stomach with proximal sideholes just below the level of the gastroesophageal junction. 2. Nonobstructive bowel gas pattern. 3. Aortic atherosclerosis. Electronically Signed   By: Franki Cabot M.D.   On: 09/20/2019 11:52   Ct Chest Wo Contrast  Result Date: 09/19/2019 CLINICAL DATA:  Midsternal chest pain for 2 weeks with shortness of breath. Nonsmoker without history of cancer. EXAM: CT CHEST WITHOUT CONTRAST TECHNIQUE: Multidetector CT imaging of the chest was performed following the standard protocol without IV contrast. COMPARISON:  09/05/2019 chest radiograph. Most recent CT of 09/02/2008 FINDINGS: Cardiovascular: Advanced aortic and branch vessel atherosclerosis. Tortuous thoracic aorta. Mild cardiomegaly, without pericardial effusion. Multivessel coronary artery  atherosclerosis. Aortic valve calcification. Mediastinum/Nodes: Hypoattenuating left thyroid nodule of 1.2 cm, new but nonspecific. No mediastinal or definite hilar adenopathy, given limitations of unenhanced CT. Lungs/Pleura: No pleural fluid.  Clear lungs. Upper Abdomen: Nonspecific caudate lobe enlargement. Similar. Normal imaged portions of the spleen, adrenal glands. Renal atrophy bilaterally. An upper pole left renal exophytic 1.8 cm cyst. Pancreatic tail 1.8 cm lesion on 114/2 is minimally enlarged compared to 2009 where it measured 1.5 cm. Calcification in its inferior portion. There is also a incompletely imaged pancreatic body lesion on 125/2, on the order of 1.8 cm-similar. Suggestion of peripancreatic inflammation, especially about the pancreatic tail lesion on 113/2. Cholecystectomy. Musculoskeletal: Lower thoracic spondylosis. No sternal fracture or retrosternal fluid. IMPRESSION: 1.  No acute process in the chest. 2. Coronary artery atherosclerosis. Aortic Atherosclerosis (ICD10-I70.0). 3.  Pancreatic cystic lesions, suboptimally and incompletely evaluated. Suspicion of peripancreatic edema. Correlate with symptoms to suggest acute on chronic pancreatitis. Consider follow-up with dedicated MRI or CT. 4. Chronic caudate lobe prominence, nonspecific. Correlate with any risk factors for cirrhosis. Electronically Signed   By: Abigail Miyamoto M.D.   On: 09/19/2019 12:28   Dg Chest Port 1 View  Result Date: 09/22/2019 CLINICAL DATA:  Pulmonary disease EXAM: PORTABLE CHEST 1 VIEW COMPARISON:  09/20/2019 FINDINGS: Bilateral diffuse interstitial thickening. No pleural effusion or pneumothorax. No focal consolidation. Stable cardiomediastinal silhouette. Thoracic aortic atherosclerosis. No acute osseous abnormality. IMPRESSION: Diffuse bilateral interstitial thickening likely reflecting chronic interstitial disease with possible superimposed interstitial edema or infection. Electronically Signed   By: Kathreen Devoid   On: 09/22/2019 18:04   Dg Chest Port 1 View  Result Date: 09/20/2019 CLINICAL DATA:  Intubation EXAM: PORTABLE CHEST 1 VIEW COMPARISON:  09/20/2019, 5:50 a.m. FINDINGS: Interval placement of endotracheal tube, tip positioned over the lower trachea, approximately 2 cm above the carina. Esophagogastric tube is position with tip and side port below the diaphragm. Mild cardiomegaly. Interval improvement in bilateral heterogeneous and interstitial opacity, most conspicuous in the right midlung. IMPRESSION: 1. Interval placement of endotracheal tube, tip positioned over the lower trachea, approximately 2 cm above the carina. Esophagogastric tube is position with tip and side port below the diaphragm. 2. Interval improvement in bilateral heterogeneous and interstitial opacity, most conspicuous in the right midlung, likely improved edema. Electronically Signed   By: Eddie Candle M.D.   On: 09/20/2019 11:20   Dg Chest Port 1 View  Result Date: 09/20/2019 CLINICAL DATA:  Intubated. Respiratory distress. EXAM: PORTABLE CHEST 1 VIEW COMPARISON:  09/05/2019 FINDINGS: The endotracheal tube tip is at the thoracic inlet. Borderline enlarged cardiac silhouette. Tortuous and calcified thoracic aorta. Extensive patchy bilateral airspace opacity, greater on the right. No pleural fluid. Thoracic spine degenerative changes. IMPRESSION: 1. Endotracheal tube tip at the thoracic inlet. It is recommended that this be advanced 4.3 cm. 2. Extensive patchy bilateral probable pneumonia. 3. Aortic atherosclerosis. Electronically Signed   By: Claudie Revering M.D.   On: 09/20/2019 06:41   Dg Chest Port 1 View  Result Date: 09/05/2019 CLINICAL DATA:  Shortness of breath EXAM: PORTABLE CHEST 1 VIEW COMPARISON:  05/23/2019 FINDINGS: Tortuosity of the thoracic aorta with diffuse calcifications. Aorta is normal size. Low lung volumes with mild peribronchial thickening and interstitial prominence. No effusions. No acute bony  abnormality. IMPRESSION: Mild peribronchial thickening and interstitial prominence may reflect bronchitic changes. Electronically Signed   By: Rolm Baptise M.D.   On: 09/05/2019 17:12    Consults: Treatment Team:  Minna Merritts, MD Anthonette Legato, MD [facility], Ander Gaster, MD  Nephrology, Dr. Candiss Norse  Subjective:    Overnight Issues: Tolerated dialysis well.  This morning he is awake alert and following commands.  Passing SBT.  Plan for extubation.  Objective:  Vital signs for last 24 hours: Temp:  [98.1 F (36.7 C)-102.6 F (39.2 C)] 98.2 F (36.8 C) (11/03 0700) Pulse Rate:  [43-106] 72 (11/03 0700) Resp:  [13-44] 15 (11/03 0700) BP: (109-167)/(45-74) 129/56 (11/03 0700) SpO2:  [91 %-100 %] 100 % (11/03 0700) Weight:  [62.1 kg] 62.1 kg (11/03 0500)  Hemodynamic parameters for last 24 hours:    Intake/Output from previous day: 11/02 0701 - 11/03 0700 In: 1077.3 [I.V.:419.5; Blood:292; IV Piggyback:305.9] Out: 430 [Urine:430]  Intake/Output this shift: No intake/output data recorded.  Vent settings for last 24 hours:  Physical Exam:  Constitutional: She appears well-developed and well-nourished. She appears ill. She is  awake and lethargic she ison bipap Chronically ill appearing  HENT:  Head: Normocephalic and atraumatic.  Mouth/Throat: Mucous membranes are pale.   Eyes: PERRL. Conjunctivae are normal. No scleral icterus.  Neck: Neck supple. No tracheal deviation present. Cardiovascular: Normal rate and regular rhythm.  Murmur heard.  Systolic murmur is present with a grade of 3/6.  More audible today. AS murmur. AV fistula on L forearm with good palpable thrill.  Respiratory: She is intubated. She has no wheezes.  Clear lung fields  GI: Soft. Bowel sounds are normal. She exhibits no distension.  Musculoskeletal: Normal range of motion.        General: No edema.  Neurological:   Awakens easily,lethargic no overt focal deficit. Skin: Skin is warm and dry.  There is pallor.  Psychiatric:       Assessment/Plan:   1.  Acute hypoxic respiratory failure:   -Due to flash pulm edema with diastolic chf and esrd. -weak response to 80 lasix due to esrd , ongoing HD  2.  Acute on chronic diastolic heart failure:  BNP 1, 516; likely cause of decompensation,  S/p cardio evaluation - appreciate input  3.  End-stage renal disease:  Dialysis yesterday as per Dr. Candiss Norse  4.  Lactic acidosis due to hypoxemia: Resolved.  5.  Anemia of chronic kidney disease:  Oozing from PIV overnight  S/p DDAVP and palteletes - likely uremia induced - now improved  Best practice: GI prophylaxis with PPI, DVT prophylaxis with Heparin subcu.    LOS: 3 days       Critical care provider statement:    Critical care time (minutes):  33   Critical care time was exclusive of:  Separately billable procedures and  treating other patients   Critical care was necessary to treat or prevent imminent or  life-threatening deterioration of the following conditions:  Acute hypoxemic respiratory failure, ESRD, acute on chronic diastolic chf, multiple comorbid conditions.    Critical care was time spent personally by me on the following  activities:  Development of treatment plan with patient or surrogate,  discussions with consultants, evaluation of patient's response to  treatment, examination of patient, obtaining history from patient or  surrogate, ordering and performing treatments and interventions, ordering  and review of laboratory studies and re-evaluation of patient's condition   I assumed direction of critical care for this patient from another  provider in my specialty: no       Ottie Glazier, M.D.  [facility] [place]

## 2019-09-23 NOTE — Progress Notes (Signed)
This note also relates to the following rows which could not be included: Temp - Cannot attach notes to unvalidated device data Pulse Rate - Cannot attach notes to unvalidated device data Resp - Cannot attach notes to unvalidated device data BP - Cannot attach notes to unvalidated device data SpO2 - Cannot attach notes to unvalidated device data    09/23/19 1915  Vital Signs  Temp Source Rectal  pt stable tolerated HD TX well no c/os avf +/+ functioned well ufg 2.0L

## 2019-09-23 NOTE — Progress Notes (Signed)
[facility], [place] 09/23/19  Subjective:  Patient due for hemodialysis today. Currently on BiPAP. Still a bit short of breath.   Objective:  Vital signs in last 24 hours:  Temp:  [98.1 F (36.7 C)-102.6 F (39.2 C)] 98.1 F (36.7 C) (11/03 1100) Pulse Rate:  [43-106] 68 (11/03 1100) Resp:  [13-44] 14 (11/03 1100) BP: (109-167)/(45-74) 150/52 (11/03 1100) SpO2:  [91 %-100 %] 100 % (11/03 1100) Weight:  [62.1 kg] 62.1 kg (11/03 0500)  Weight change: 2.9 kg Filed Weights   09/21/19 1508 09/22/19 0437 09/23/19 0500  Weight: 57 kg 59.9 kg 62.1 kg    Intake/Output:    Intake/Output Summary (Last 24 hours) at 09/23/2019 1116 Last data filed at 09/23/2019 0600 Gross per 24 hour  Intake 1040.04 ml  Output 340 ml  Net 700.04 ml     Physical Exam: General: No acute distress  HEENT normocephalic, atraumatic, hearing intact  Pulm/lungs Coarse crackles b/l, on BiPAP.  CVS/Heart Regular, no rubs  Abdomen:  Soft, non distended  Extremities: No edema  Neurologic: Alert, able to follow simple commands  Skin: No acute rashes, warm dry  Access: Left forearm AVF       Basic Metabolic Panel:  Recent Labs  Lab 09/20/19 0526 09/21/19 0435 09/21/19 0441 09/22/19 0752 09/23/19 0346  NA 138 140  --  143 143  K 3.7 3.9  --  3.1* 3.7  CL 99 97*  --  100 99  CO2 25 26  --  30 28  GLUCOSE 190* 123*  --  95 122*  BUN 49* 18  --  18 33*  CREATININE 6.54* 3.05*  --  3.05* 4.57*  CALCIUM 7.8* 8.4*  --  7.8* 7.6*  MG  --   --  2.4 2.0 1.9  PHOS  --   --   --  2.5  --      CBC: Recent Labs  Lab 09/20/19 0526 09/21/19 0435 09/21/19 1459 09/21/19 2238 09/22/19 0752 09/22/19 1645 09/23/19 0346  WBC 15.1* 9.3  --  8.2 6.6 9.1 7.2  NEUTROABS 12.3*  --   --   --   --   --   --   HGB 7.9* 7.3* 8.0* 6.8* 8.0* 8.5* 7.6*  HCT 24.5* 21.5* 23.7* 21.2* 24.0* 26.0* 23.6*  MCV 92.5 88.1  --  89.5 88.2 89.7 89.4  PLT 206 126*  --  126* 107* 108* 105*       Lab Results  Component Value Date   HEPBSAG NON REACTIVE 09/06/2019      Microbiology:  Recent Results (from the past 240 hour(s))  Culture, blood (routine x 2)     Status: None (Preliminary result)   Collection Time: 09/20/19  5:26 AM   Specimen: BLOOD  Result Value Ref Range Status   Specimen Description BLOOD RIGHT FOREARM  Final   Special Requests   Final    BOTTLES DRAWN AEROBIC AND ANAEROBIC Blood Culture adequate volume   Culture   Final    NO GROWTH 3 DAYS Performed at [facility], [address] [place],  [address]    Report Status PENDING  Incomplete  Culture, blood (routine x 2)     Status: None (Preliminary result)   Collection Time: 09/20/19  5:26 AM   Specimen: BLOOD  Result Value Ref Range Status   Specimen Description BLOOD RIGHT HAND  Final   Special Requests   Final    BOTTLES DRAWN AEROBIC AND  ANAEROBIC Blood Culture adequate volume   Culture   Final    NO GROWTH 3 DAYS Performed at [facility], [address] [place], [place] [address]    Report Status PENDING  Incomplete  SARS Coronavirus 2 by RT PCR (hospital order, performed in [facility] hospital lab) Nasopharyngeal Nasopharyngeal Swab     Status: None   Collection Time: 09/20/19  5:26 AM   Specimen: Nasopharyngeal Swab  Result Value Ref Range Status   SARS Coronavirus 2 NEGATIVE NEGATIVE Final    Comment: (NOTE) If result is NEGATIVE SARS-CoV-2 target nucleic acids are NOT DETECTED. The SARS-CoV-2 RNA is generally detectable in upper and lower  respiratory specimens during the acute phase of infection. The lowest  concentration of SARS-CoV-2 viral copies this assay can detect is 250  copies / mL. A negative result does not preclude SARS-CoV-2 infection  and should not be used as the sole basis for treatment or other  patient management decisions.  A negative result may occur with  improper specimen collection / handling, submission of specimen other   than nasopharyngeal swab, presence of viral mutation(s) within the  areas targeted by this assay, and inadequate number of viral copies  (<250 copies / mL). A negative result must be combined with clinical  observations, patient history, and epidemiological information. If result is POSITIVE SARS-CoV-2 target nucleic acids are DETECTED. The SARS-CoV-2 RNA is generally detectable in upper and lower  respiratory specimens dur ing the acute phase of infection.  Positive  results are indicative of active infection with SARS-CoV-2.  Clinical  correlation with patient history and other diagnostic information is  necessary to determine patient infection status.  Positive results do  not rule out bacterial infection or co-infection with other viruses. If result is PRESUMPTIVE POSTIVE SARS-CoV-2 nucleic acids MAY BE PRESENT.   A presumptive positive result was obtained on the submitted specimen  and confirmed on repeat testing.  While 2019 novel coronavirus  (SARS-CoV-2) nucleic acids may be present in the submitted sample  additional confirmatory testing may be necessary for epidemiological  and / or clinical management purposes  to differentiate between  SARS-CoV-2 and other Sarbecovirus currently known to infect humans.  If clinically indicated additional testing with an alternate test  methodology [phone]) is advised. The SARS-CoV-2 RNA is generally  detectable in upper and lower respiratory sp ecimens during the acute  phase of infection. The expected result is Negative. Fact Sheet for Patients:  StrictlyIdeas.no Fact Sheet for Healthcare Providers: BankingDealers.co.za This test is not yet approved or cleared by the [place] FDA and has been authorized for detection and/or diagnosis of SARS-CoV-2 by FDA under an Emergency Use Authorization (EUA).  This EUA will remain in effect (meaning this test can be used) for the duration of  the COVID-19 declaration under Section 564(b)(1) of the Act, 21 U.S.C. section 360bbb-3(b)(1), unless the authorization is terminated or revoked sooner. Performed at [facility], [address] [place], [place] [address]   MRSA PCR Screening     Status: None   Collection Time: 09/20/19 10:12 AM   Specimen: Nasopharyngeal  Result Value Ref Range Status   MRSA by PCR NEGATIVE NEGATIVE Final    Comment:        The GeneXpert MRSA Assay (FDA approved for NASAL specimens only), is one component of a comprehensive MRSA colonization surveillance program. It is not intended to diagnose MRSA infection nor to guide or monitor treatment for MRSA infections. Performed at [facility], [address], [place], [place] [address]   CULTURE, BLOOD (ROUTINE X 2) w Reflex to ID Panel     Status: None (Preliminary result)   Collection Time: 09/22/19  6:30 PM   Specimen: BLOOD  Result Value Ref Range Status   Specimen Description BLOOD BLOOD LEFT HAND  Final   Special Requests   Final    BOTTLES DRAWN AEROBIC ONLY Blood Culture results may not be optimal due to an inadequate volume of blood received in culture bottles   Culture   Final    NO GROWTH < 12 HOURS Performed at [facility], [address], [place], [place] [address]    Report Status PENDING  Incomplete  CULTURE, BLOOD (ROUTINE X 2) w Reflex to ID Panel     Status: None (Preliminary result)   Collection Time: 09/22/19  6:53 PM   Specimen: BLOOD  Result Value Ref Range Status   Specimen Description BLOOD BLOOD RIGHT HAND  Final   Special Requests   Final    BOTTLES DRAWN AEROBIC AND ANAEROBIC Blood Culture adequate volume   Culture   Final    NO GROWTH < 12 HOURS Performed at [facility], [address], [place], [place] [address]    Report Status PENDING  Incomplete    Coagulation Studies: Recent Labs    09/21/19 Feb 14, 2237 09/22/19 1645  LABPROT 15.1 14.1  INR 1.2 1.1     Urinalysis: No results for input(s): COLORURINE, LABSPEC, PHURINE, GLUCOSEU, HGBUR, BILIRUBINUR, KETONESUR, PROTEINUR, UROBILINOGEN, NITRITE, LEUKOCYTESUR in the last 72 hours.  Invalid input(s): APPERANCEUR    Imaging: Dg Chest Port 1 View  Result Date: 09/22/2019 CLINICAL DATA:  Pulmonary disease EXAM: PORTABLE CHEST 1 VIEW COMPARISON:  09/20/2019 FINDINGS: Bilateral diffuse interstitial thickening. No pleural effusion or pneumothorax. No focal consolidation. Stable cardiomediastinal silhouette. Thoracic aortic atherosclerosis. No acute osseous abnormality. IMPRESSION: Diffuse bilateral interstitial thickening likely reflecting chronic interstitial disease with possible superimposed interstitial edema or infection. Electronically Signed   By: Kathreen Devoid   On: 09/22/2019 18:04     Medications:   . sodium chloride Stopped (09/22/19 1822)  . azithromycin Stopped (09/22/19 14-Feb-2110)  . cefTRIAXone (ROCEPHIN)  IV 1 g (09/22/19 1859)  . nitroGLYCERIN 40 mcg/min (09/23/19 0600)   . sodium chloride   Intravenous Once  . amLODipine  10 mg Oral Daily  . aspirin  81 mg Oral Daily  . carvedilol  12.5 mg Oral BID WC  . Chlorhexidine Gluconate Cloth  6 each Topical Q0600  . epoetin (EPOGEN/PROCRIT) injection  4,000 Units Intravenous Q T,Th,Sa-HD  . furosemide  80 mg Intravenous BID  . insulin aspart  0-9 Units Subcutaneous Q4H  . mouth rinse  15 mL Mouth Rinse BID  . pantoprazole (PROTONIX) IV  40 mg Intravenous Daily     Assessment/ Plan:  73 y.o. female with end stage renal disease on hemodialysis, hypertension, diabetes mellitus type II, overactive bladder, gout, COPD, congestive heart failure, coronary artery disease, aortic atherosclerosis  Active Problems:   Acute respiratory failure with hypoxemia ([place])  [facility]// [facility] //TuThSa-2// TW 60kg  #. ESRD with acute resp failure -Patient due for hemodialysis with ultrafiltration today.  Orders have been prepared  for this.   #. Anemia of CKD  Lab Results  Component Value Date   HGB 7.6 (L) 09/23/2019  Hemoglobin currently 7.6.  No urgent indication for transfusion but this may need to be considered if hemoglobin continues to drop.  For now  maintain the patient on Epogen 4000 units IV with dialysis.  Patient currently on BiPAP.   #. SHPTH     Component Value Date/Time   PTH 160 (H) 05/24/2019 0413   Lab Results  Component Value Date   PHOS 2.5 09/22/2019  Repeat serum phosphorus today.  Most recent serum phosphorus as above was 2.5.       LOS: 3 Darcee Dekker 11/3/202011:16 AM  [place], [place]

## 2019-09-24 ENCOUNTER — Inpatient Hospital Stay: Payer: Medicare Other

## 2019-09-24 DIAGNOSIS — I509 Heart failure, unspecified: Secondary | ICD-10-CM

## 2019-09-24 DIAGNOSIS — I10 Essential (primary) hypertension: Secondary | ICD-10-CM

## 2019-09-24 DIAGNOSIS — I350 Nonrheumatic aortic (valve) stenosis: Secondary | ICD-10-CM

## 2019-09-24 LAB — BPAM RBC
Blood Product Expiration Date: 202011072359
Blood Product Expiration Date: 202011262359
ISSUE DATE / TIME: 202011011216
ISSUE DATE / TIME: 202011020138
Unit Type and Rh: 5100
Unit Type and Rh: 5100

## 2019-09-24 LAB — TYPE AND SCREEN
ABO/RH(D): O POS
Antibody Screen: NEGATIVE
Unit division: 0
Unit division: 0

## 2019-09-24 LAB — BASIC METABOLIC PANEL
Anion gap: 12 (ref 5–15)
BUN: 14 mg/dL (ref 8–23)
CO2: 31 mmol/L (ref 22–32)
Calcium: 8.2 mg/dL — ABNORMAL LOW (ref 8.9–10.3)
Chloride: 97 mmol/L — ABNORMAL LOW (ref 98–111)
Creatinine, Ser: 2.54 mg/dL — ABNORMAL HIGH (ref 0.44–1.00)
GFR calc Af Amer: 21 mL/min — ABNORMAL LOW (ref >60)
GFR calc non Af Amer: 18 mL/min — ABNORMAL LOW (ref >60)
Glucose, Bld: 101 mg/dL — ABNORMAL HIGH (ref 70–99)
Potassium: 3.5 mmol/L (ref 3.5–5.1)
Sodium: 140 mmol/L (ref 135–145)

## 2019-09-24 LAB — GLUCOSE, CAPILLARY
Glucose-Capillary: 93 mg/dL (ref 70–99)
Glucose-Capillary: 150 mg/dL — ABNORMAL HIGH (ref 70–99)
Glucose-Capillary: 130 mg/dL — ABNORMAL HIGH (ref 70–99)
Glucose-Capillary: 157 mg/dL — ABNORMAL HIGH (ref 70–99)

## 2019-09-24 LAB — ABO/RH: ABO/RH(D): O POS

## 2019-09-24 LAB — PROCALCITONIN: Procalcitonin: 3.11 ng/mL

## 2019-09-24 LAB — CBC
HCT: 26.4 % — ABNORMAL LOW (ref 36.0–46.0)
Hemoglobin: 8.6 g/dL — ABNORMAL LOW (ref 12.0–15.0)
MCH: 29.1 pg (ref 26.0–34.0)
MCHC: 32.6 g/dL (ref 30.0–36.0)
MCV: 89.2 fL (ref 80.0–100.0)
Platelets: 106 10*3/uL — ABNORMAL LOW (ref 150–400)
RBC: 2.96 MIL/uL — ABNORMAL LOW (ref 3.87–5.11)
RDW: 14.4 % (ref 11.5–15.5)
WBC: 6 10*3/uL (ref 4.0–10.5)
nRBC: 0 % (ref 0.0–0.2)

## 2019-09-24 MED ORDER — LOSARTAN POTASSIUM 50 MG PO TABS
50.0000 mg | ORAL_TABLET | Freq: Every day | ORAL | Status: DC
  Administered 2019-09-24 – 2019-09-30 (×7): 50 mg via ORAL
  Filled 2019-09-24 (×7): qty 1

## 2019-09-24 MED ORDER — CHLORHEXIDINE GLUCONATE 0.12 % MT SOLN
OROMUCOSAL | Status: AC
  Administered 2019-09-24: 15 mL
  Filled 2019-09-24: qty 15

## 2019-09-24 NOTE — Progress Notes (Signed)
Pharmacy Electrolyte Monitoring Consult:  Phyllis [name] is a 64 YOF admitted on 09/20/2019 with respiratory distress. She is a lifelong never smoker with ESRD on HD (Tues, Thurs, Sat). Patient presented with acute hypoxic respiratory failure due to volume overload.  She was admitted to the ICU, intubated, and mechanically ventilated. Emergency dialysis ensued.   Patient was dialyzed yesterday and was received Lasix 80mg  IV x 1. Pharmacy was consulted to assist in monitoring and replacing electrolytes.  Labs:  Sodium (mmol/L)  Date Value  09/24/2019 140  12/10/2014 142   Potassium (mmol/L)  Date Value  09/24/2019 3.5  12/10/2014 3.6   Magnesium (mg/dL)  Date Value  09/23/2019 1.9   Phosphorus (mg/dL)  Date Value  09/22/2019 2.5  12/09/2014 4.4   Calcium (mg/dL)  Date Value  09/24/2019 8.2 (L)   Calcium, Total (mg/dL)  Date Value  12/10/2014 8.6   Albumin (g/dL)  Date Value  09/22/2019 3.3 (L)  12/07/2014 2.5 (L)   Corrected Calcium: 8.8 mg/dL  Electrolytes:  Assessment/Plan: - No electrolyte replacement warranted at this time. - Will check AM labs and supplement accordingly.  Goals:  - Mg ~ 2 - K ~ 4  Thank you for allowing pharmacy to be a part of this patient's care.  Raiford Simmonds, PharmD Candidate 09/24/2019 1:39 PM

## 2019-09-24 NOTE — Progress Notes (Signed)
Progress Note  [name] Date of Encounter: 09/24/2019  Primary Cardiologist: Ida Rogue, MD   Subjective   Patient was seen this morning, states doing okay, denies chest pain or shortness of breath.  Eating much better after dialysis yesterday.  Inpatient Medications    Scheduled Meds: . sodium chloride   Intravenous Once  . amLODipine  10 mg Oral Daily  . aspirin  81 mg Oral Daily  . carvedilol  12.5 mg Oral BID WC  . Chlorhexidine Gluconate Cloth  6 each Topical Q0600  . epoetin (EPOGEN/PROCRIT) injection  4,000 Units Intravenous Q T,Th,Sa-HD  . insulin aspart  0-5 Units Subcutaneous QHS  . insulin aspart  0-9 Units Subcutaneous TID WC  . mouth rinse  15 mL Mouth Rinse BID  . pantoprazole (PROTONIX) IV  40 mg Intravenous Daily   Continuous Infusions: . sodium chloride Stopped (09/22/19 1822)  . azithromycin Stopped (09/23/19 2218)  . cefTRIAXone (ROCEPHIN)  IV Stopped (09/23/19 2100)   PRN Meds: sodium chloride, acetaminophen, albuterol, guaiFENesin-dextromethorphan, HYDROcodone-acetaminophen, midazolam, morphine injection   Vital Signs    Vitals:   09/24/19 0900 09/24/19 1000 09/24/19 1039 09/24/19 1100  BP: (!) 155/49 (!) 170/58 (!) 170/58 (!) 160/52  Pulse: 76 75 71 67  Resp: (!) 23 (!) 26  (!) 23  Temp: 100 F (37.8 C) 100 F (37.8 C) 100 F (37.8 C) 100 F (37.8 C)  TempSrc:      SpO2: 100% 100%  100%  Weight:      Height:        Intake/Output Summary (Last 24 hours) at 09/24/2019 1139 Last data filed at 09/24/2019 0600 Gross per 24 hour  Intake 616.15 ml  Output 2160 ml  Net -1543.85 ml   Last 3 Weights 09/24/2019 09/23/2019 09/22/2019  Weight (lbs) 137 lb 2 oz 136 lb 14.5 oz 132 lb 0.9 oz  Weight (kg) 62.2 kg 62.1 kg 59.9 kg      Telemetry    Sinus rhythm heart rate 80s.- Personally Reviewed  ECG    Not performed today-  Physical Exam   GEN: No acute distress.   Neck: No JVD Cardiac: RRR, 2/6 systolic  murmur, rubs, or gallops.  Respiratory:  Poor inspiratory effort, decreased sounds at bases. GI: Soft, nontender, non-distended  MS: No edema; No deformity. Neuro:  Nonfocal  Psych: Normal affect   Labs    High Sensitivity Troponin:   Recent Labs  Lab 09/21/19 1424 09/21/19 1620 09/22/19 1054 09/22/19 1238 09/22/19 1853  TROPONINIHS 109* 114* 211* 204* 185*      Chemistry Recent Labs  Lab 09/20/19 0526  09/22/19 0752 09/23/19 0346 09/24/19 0346  NA 138   < > 143 143 140  K 3.7   < > 3.1* 3.7 3.5  CL 99   < > 100 99 97*  CO2 25   < > 30 28 31   GLUCOSE 190*   < > 95 122* 101*  BUN 49*   < > 18 33* 14  CREATININE 6.54*   < > 3.05* 4.57* 2.54*  CALCIUM 7.8*   < > 7.8* 7.6* 8.2*  PROT 6.8  --  5.7*  --   --   ALBUMIN 3.9  --  3.3*  --   --   AST 16  --  15  --   --   ALT 11  --  12  --   --   ALKPHOS 68  --  44  --   --  BILITOT 0.7  --  1.3*  --   --   GFRNONAA 6*   < > 15* 9* 18*  GFRAA 7*   < > 17* 10* 21*  ANIONGAP 14   < > 13 16* 12   < > = values in this interval not displayed.     Hematology Recent Labs  Lab 09/22/19 1645 09/23/19 0346 09/24/19 0346  WBC 9.1 7.2 6.0  RBC 2.90* 2.64* 2.96*  HGB 8.5* 7.6* 8.6*  HCT 26.0* 23.6* 26.4*  MCV 89.7 89.4 89.2  MCH 29.3 28.8 29.1  MCHC 32.7 32.2 32.6  RDW 15.8* 15.2 14.4  PLT 108* 105* 106*    BNP Recent Labs  Lab 09/20/19 0526  BNP 1,516.0*     DDimer No results for input(s): DDIMER in the last 168 hours.   Radiology    Dg Chest Port 1 View  Result Date: 09/24/2019 CLINICAL DATA:  Pulmonary infiltrates. EXAM: PORTABLE CHEST 1 VIEW COMPARISON:  09/22/2019 FINDINGS: Improved appearance of interstitial and heterogeneous opacity seen on prior exams. Cardiomediastinal contours are stable with cardiac enlargement and dense aortic calcification and tortuosity. No signs of effusion or dense consolidation. No acute bone finding. IMPRESSION: 1. Improved appearance of presumed edema, potentially  superimposed on chronic lung disease. 2. Stable dense aortic calcification and tortuosity. Electronically Signed   By: Zetta Bills M.D.   On: 09/24/2019 10:41   Dg Chest Port 1 View  Result Date: 09/22/2019 CLINICAL DATA:  Pulmonary disease EXAM: PORTABLE CHEST 1 VIEW COMPARISON:  09/20/2019 FINDINGS: Bilateral diffuse interstitial thickening. No pleural effusion or pneumothorax. No focal consolidation. Stable cardiomediastinal silhouette. Thoracic aortic atherosclerosis. No acute osseous abnormality. IMPRESSION: Diffuse bilateral interstitial thickening likely reflecting chronic interstitial disease with possible superimposed interstitial edema or infection. Electronically Signed   By: Kathreen Devoid   On: 09/22/2019 18:04    Cardiac Studies   TTE (09/21/2019): 1. Left ventricular ejection fraction, by visual estimation, is 60 to 65%. The left ventricle has normal function. There is mildly increased left ventricular hypertrophy. 2. Global right ventricle has normal systolic function.The right ventricular size is normal. No increase in right ventricular wall thickness. 3. Left atrial size was mild-moderately dilated. 4. Mild mitral valve regurgitation. 5. The tricuspid valve is normal in structure. Tricuspid valve regurgitation is not demonstrated. 6. The aortic valve was not well visualized. Measurements as detailed below, Moderate aortic valve stenosis by gradients, severe stenosis by estimated AVA. 7. TR signal is inadequate for assessing pulmonary artery systolic pressure. 8. Aortic valve mean gradient measures 20.6 mmHg. 9. Aortic valve peak gradient measures 32.0 mmHg. 10. Aortic valve area, by VTI measures 0.68 cm.  Patient Profile     73 y.o. female with history of end-stage renal disease on dialysis, aortic stenosis, hyperlipidemia who presents due to shortness of breath, found to have pulmonary edema requiring intubation.  Patient is now currently extubated.  Assessment &  Plan    She seems clinically improved after hemodialysis yesterday.  HFpEF, pulm edema -Nitro drip weaned off -Continue volume removal with dialysis.  Dialysis is planned again for tomorrow.  Hypertension -Blood pressure not well controlled -Continue Norvasc 10 mg daily, continue Coreg 12.5 mg twice daily -We will restart patient home dose of losartan 50 mg daily.  End-stage renal disease on dialysis -Dialysis schedule as per nephrology team.  Moderate to severe aortic stenosis Multiple comorbidities puts her at high risk for intervention. -Management options can be discussed with the heart valve team in the  future.     Signed, Kate Sable, MD  09/24/2019, 11:39 AM

## 2019-09-24 NOTE — Progress Notes (Signed)
CRITICAL CARE PROGRESS NOTE    Name: Phyllis [name] MRN: [id number] DOB: [birth year]     LOS: 4   SUBJECTIVE FINDINGS & SIGNIFICANT EVENTS   Patient description:  Phyllis [name] is an 73 y.o. female lifelong never smoker with ESRD on hemodialysis Tuesday Thursday Saturday and history as noted below presented with acute hypoxic respiratory failure due to volume overload.   Patient was admitted to the ICU intubated and mechanically ventilated.  Emergent dialysis ensued.   Lines / Drains: AVF, PIVx2  Cultures / Sepsis markers: Blood culture 2 sets -2 days Blood culture 2 sets -4 days COVID-19 negative MRSA PCR negative Procalcitonin trending down from 4.19-3.11  Antibiotics: Zithromax Rocephin day 2   Protocols / Consultants: Nephrology, pharmacy , cardiology, critical care  Tests / Events: 11/2 -Chest pain this morning despite nitroglycerine, cough worsening will start morphine.   11/3 patient febrile overnight, septic workup done, on empiric abx , abnormal cxr, elevated PCT 11/4-significant improvement post antibiotic and HD therapy, low-grade fever this a.m. improved from previous   Overnight: Clinically improved, mentation lucid able to communicate appropriately   PAST MEDICAL HISTORY   Past Medical History:  Diagnosis Date  . (HFpEF) heart failure with preserved ejection fraction ([place])    a. TTE 01/2014: nl LV sys fxn, no valvular abnormalities; b. TTE 11/16: nl EF, mild LVH;  c. 04/2019 Echo: EF 60-65%. DD. Nl RV fxn. Mod AS. 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  . Allergy   . Anemia of chronic disease   . Anxiety   . Aortic atherosclerosis ([place])   . Asthma   . Chronic back pain   . COPD (chronic obstructive pulmonary disease) ([place])   . Diabetes mellitus with  complication ([place])   . ESRD on hemodialysis ([place])    a. Tues/Sat; b. 2/2 small kidneys  . Essential hypertension   . Fistula    lower left arm  . GERD (gastroesophageal reflux disease)   . Gout   . History of exercise stress test    a. 01/2014: no evidence of ischemia; b. Lexiscan 08/2015: no sig ischemia, severe GI uptake artifact, low risk; c. CPET @ [facility] 09/2016: exercised 3 min 12 sec on bike without incline, 2.28 METs, VO2 of 8.1, 48% of predicted, indicating mod to sev functional impairment, evidence of blunted HR, stroke volume, and BP augmentation as well as ventilation-perfusion mismatch with exercise  . HLD (hyperlipidemia)   . Moderate aortic stenosis    a. 04/2019 Echo: Mod AS.  . Non-obstructive Carotid arterial disease ([place])    a. 12/2017: <50% bilat ICA dzs.  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 Sleep apnea      SURGICAL HISTORY   Past Surgical History:  Procedure Laterality Date  . carpel tunnel    . GALLBLADDER SURGERY    . PERIPHERAL VASCULAR CATHETERIZATION N/A 04/12/2015   Procedure: A/V Shuntogram/Fistulagram;  Surgeon: Algernon Huxley, MD;  Location: [facility];  Service: Cardiovascular;  Laterality: N/A;  . PERIPHERAL VASCULAR CATHETERIZATION N/A 04/12/2015   Procedure: A/V Shunt Intervention;  Surgeon: Algernon Huxley, MD;  Location: [facility];  Service: Cardiovascular;  Laterality: N/A;  . PERIPHERAL VASCULAR CATHETERIZATION N/A 06/09/2015   Procedure: Dialysis/Perma Catheter Removal;  Surgeon: Katha Cabal, MD;  Location: [facility];  Service: Cardiovascular;  Laterality: N/A;     FAMILY HISTORY   Family History  Problem Relation  Age of Onset  . Hypertension Mother   . Hyperlipidemia Mother   . Heart disease Father   . Heart attack Father 40  . Hypertension Father   . Hyperlipidemia Father   . Heart disease Brother        CABG   . Heart attack Brother   . Breast cancer Neg Hx      SOCIAL  HISTORY   Social History   Tobacco Use  . Smoking status: Never Smoker  . Smokeless tobacco: Never Used  Substance Use Topics  . Alcohol use: No  . Drug use: No     MEDICATIONS   Current Medication:  Current Facility-Administered Medications:  .  0.9 %  sodium chloride infusion (Manually program via Guardrails IV Fluids), , Intravenous, Once, Tukov-Yual, Magdalene S, NP .  0.9 %  sodium chloride infusion, , Intravenous, PRN, Ottie Glazier, MD, Stopped at 09/22/19 1822 .  acetaminophen (TYLENOL) tablet 500 mg, 500 mg, Oral, Q6H PRN, Ottie Glazier, MD, 500 mg at 09/22/19 1312 .  albuterol (PROVENTIL) (2.5 MG/3ML) 0.083% nebulizer solution 2.5 mg, 2.5 mg, Nebulization, Q4H PRN, Lanney Gins, Shawntelle Ungar, MD .  amLODipine (NORVASC) tablet 10 mg, 10 mg, Oral, Daily, Arida, Muhammad A, MD, 10 mg at 09/22/19 1708 .  aspirin chewable tablet 81 mg, 81 mg, Oral, Daily, Arida, Muhammad A, MD .  azithromycin (ZITHROMAX) 500 mg in sodium chloride 0.9 % 250 mL IVPB, 500 mg, Intravenous, Q24H, Ottie Glazier, MD, Stopped at 09/23/19 2218 .  carvedilol (COREG) tablet 12.5 mg, 12.5 mg, Oral, BID WC, Arida, Muhammad A, MD, 12.5 mg at 09/23/19 1652 .  cefTRIAXone (ROCEPHIN) 1 g in sodium chloride 0.9 % 100 mL IVPB, 1 g, Intravenous, Q24H, Ottie Glazier, MD, Stopped at 09/23/19 2100 .  Chlorhexidine Gluconate Cloth 2 % PADS 6 each, 6 each, Topical, Q0600, Murlean Iba, MD, 6 each at 09/24/19 0200 .  epoetin alfa (EPOGEN) injection 4,000 Units, 4,000 Units, Intravenous, Q T,Th,Sa-HD, Murlean Iba, MD, 4,000 Units at 09/23/19 1541 .  furosemide (LASIX) injection 80 mg, 80 mg, Intravenous, BID, Ottie Glazier, MD, 80 mg at 09/23/19 2028 .  guaiFENesin-dextromethorphan (ROBITUSSIN DM) 100-10 MG/5ML syrup 5 mL, 5 mL, Oral, Q6H PRN, Lanney Gins, Grady Lucci, MD, 5 mL at 09/22/19 1120 .  HYDROcodone-acetaminophen (NORCO) 7.5-325 MG per tablet 1 tablet, 1 tablet, Oral, Q8H PRN, Tyler Pita, MD, 1 tablet at  09/22/19 0540 .  insulin aspart (novoLOG) injection 0-5 Units, 0-5 Units, Subcutaneous, QHS, Blakeney, Dana G, NP .  insulin aspart (novoLOG) injection 0-9 Units, 0-9 Units, Subcutaneous, TID WC, Blakeney, Dreama Saa, NP .  MEDLINE mouth rinse, 15 mL, Mouth Rinse, BID, Tyler Pita, MD, 15 mL at 09/23/19 2208 .  midazolam (VERSED) injection 2 mg, 2 mg, Intravenous, Q2H PRN, Tyler Pita, MD, 2 mg at 09/20/19 1722 .  morphine 2 MG/ML injection 1-2 mg, 1-2 mg, Intravenous, Q2H PRN, Ottie Glazier, MD, 2 mg at 09/22/19 1813 .  nitroGLYCERIN 50 mg in dextrose 5 % 250 mL (0.2 mg/mL) infusion, 0-200 mcg/min, Intravenous, Titrated, Tyler Pita, MD, Stopped at 09/23/19 0908 .  pantoprazole (PROTONIX) injection 40 mg, 40 mg, Intravenous, Daily, Lanney Gins, Elmore Hyslop, MD, 40 mg at 09/23/19 0908    ALLERGIES   Enalapril maleate; Nitrofurantoin; Sulfamethoxazole-trimethoprim; 2,4-d dimethylamine (amisol); Baclofen; Neosporin [neomycin-bacitracin zn-polymyx]; Quinine; Ultram [tramadol]; Zocor [simvastatin]; Bactrim [sulfamethoxazole-trimethoprim]; Levodopa; Macrodantin [nitrofurantoin macrocrystal]; and Quinine derivatives    REVIEW OF SYSTEMS    10 point ROS conducted and is negative except as per  subjective findings  PHYSICAL EXAMINATION   Vital Signs: Temp:  [98.1 F (36.7 C)-99.9 F (37.7 C)] 99.9 F (37.7 C) (11/04 0700) Pulse Rate:  [58-82] 67 (11/04 0700) Resp:  [13-27] 22 (11/04 0700) BP: (137-185)/(43-84) 144/51 (11/04 0700) SpO2:  [99 %-100 %] 100 % (11/04 0700) Weight:  [62.2 kg] 62.2 kg (11/04 0300)  GENERAL: Well-nourished age-appropriate HEAD: Normocephalic, atraumatic.  EYES: Pupils equal, round, reactive to light.  No scleral icterus.  MOUTH: Moist mucosal membrane. NECK: Supple. No thyromegaly. No nodules. No JVD.  PULMONARY: Crackles at the bases bilaterally CARDIOVASCULAR: S1 and S2. Regular rate and rhythm. No murmurs, rubs, or gallops.  GASTROINTESTINAL:  Soft, nontender, non-distended. No masses. Positive bowel sounds. No hepatosplenomegaly.  MUSCULOSKELETAL: No swelling, clubbing, or edema.  NEUROLOGIC: Mild distress due to acute illness SKIN:intact,warm,dry   PERTINENT DATA     Infusions: . sodium chloride Stopped (09/22/19 1822)  . azithromycin Stopped (09/23/19 2218)  . cefTRIAXone (ROCEPHIN)  IV Stopped (09/23/19 2100)  . nitroGLYCERIN Stopped (09/23/19 0908)   Scheduled Medications: . sodium chloride   Intravenous Once  . amLODipine  10 mg Oral Daily  . aspirin  81 mg Oral Daily  . carvedilol  12.5 mg Oral BID WC  . Chlorhexidine Gluconate Cloth  6 each Topical Q0600  . epoetin (EPOGEN/PROCRIT) injection  4,000 Units Intravenous Q T,Th,Sa-HD  . furosemide  80 mg Intravenous BID  . insulin aspart  0-5 Units Subcutaneous QHS  . insulin aspart  0-9 Units Subcutaneous TID WC  . mouth rinse  15 mL Mouth Rinse BID  . pantoprazole (PROTONIX) IV  40 mg Intravenous Daily   PRN Medications: sodium chloride, acetaminophen, albuterol, guaiFENesin-dextromethorphan, HYDROcodone-acetaminophen, midazolam, morphine injection Hemodynamic parameters:   Intake/Output: 11/03 0701 - 11/04 0700 In: 616.2 [P.O.:240; I.V.:26.2; IV Piggyback:350] Out: 2260 [Urine:260]  Ventilator  Settings:       LAB RESULTS:  Basic Metabolic Panel: Recent Labs  Lab 09/20/19 0526 09/21/19 0435 09/21/19 0441 09/22/19 0752 09/23/19 0346 09/24/19 0346  NA 138 140  --  143 143 140  K 3.7 3.9  --  3.1* 3.7 3.5  CL 99 97*  --  100 99 97*  CO2 25 26  --  30 28 31   GLUCOSE 190* 123*  --  95 122* 101*  BUN 49* 18  --  18 33* 14  CREATININE 6.54* 3.05*  --  3.05* 4.57* 2.54*  CALCIUM 7.8* 8.4*  --  7.8* 7.6* 8.2*  MG  --   --  2.4 2.0 1.9  --   PHOS  --   --   --  2.5  --   --    Liver Function Tests: Recent Labs  Lab 09/20/19 0526 09/22/19 0752  AST 16 15  ALT 11 12  ALKPHOS 68 44  BILITOT 0.7 1.3*  PROT 6.8 5.7*  ALBUMIN 3.9 3.3*    Recent Labs  Lab 09/22/19 1353  LIPASE 17   No results for input(s): AMMONIA in the last 168 hours. CBC: Recent Labs  Lab 09/20/19 0526  09/21/19 2238 09/22/19 0752 09/22/19 1645 09/23/19 0346 09/24/19 0346  WBC 15.1*   < > 8.2 6.6 9.1 7.2 6.0  NEUTROABS 12.3*  --   --   --   --   --   --   HGB 7.9*   < > 6.8* 8.0* 8.5* 7.6* 8.6*  HCT 24.5*   < > 21.2* 24.0* 26.0* 23.6* 26.4*  MCV 92.5   < > 89.5  88.2 89.7 89.4 89.2  PLT 206   < > 126* 107* 108* 105* 106*   < > = values in this interval not displayed.   Cardiac Enzymes: No results for input(s): CKTOTAL, CKMB, CKMBINDEX, TROPONINI in the last 168 hours. BNP: Invalid input(s): POCBNP CBG: Recent Labs  Lab 09/23/19 0738 09/23/19 1120 09/23/19 1615 09/23/19 2206 09/24/19 0803  GLUCAP 93 79 99 146* 93     IMAGING RESULTS:  Imaging: Dg Chest Port 1 View  Result Date: 09/22/2019 CLINICAL DATA:  Pulmonary disease EXAM: PORTABLE CHEST 1 VIEW COMPARISON:  09/20/2019 FINDINGS: Bilateral diffuse interstitial thickening. No pleural effusion or pneumothorax. No focal consolidation. Stable cardiomediastinal silhouette. Thoracic aortic atherosclerosis. No acute osseous abnormality. IMPRESSION: Diffuse bilateral interstitial thickening likely reflecting chronic interstitial disease with possible superimposed interstitial edema or infection. Electronically Signed   By: Kathreen Devoid   On: 09/22/2019 18:04         ASSESSMENT AND PLAN    -Multidisciplinary rounds held today  Acute Hypoxic Respiratory Failure -Likely due to acute on chronic heart failure with preserved EF with acute flash pulmonary edema in a background of ESRD -Improved post HD and diuresis -Continue BiPAP nightly -PT/ OT evaluation today   Heart failure with preserved EF acute on chronic -Complicated by moderate to severe aortic stenosis and ESRD -Cardiology on case appreciate input- will continue amlodipine and carvedilol oxygen as needed -Chest pain  resolved, nitroglycerin stopped ICU telemetry monitoring monitoring    Renal Failure-ESRD -Nephrology on case-appreciate input-HD with ultrafiltration -Anemia of chronic disease -Epogen 4000 -follow chem 7 -Hyperphosphatemia   Acute blood loss anemia -Due to oozing from AV fistula and peripheral lines likely secondary to uremia -Required PRBC transfusion -Status post platelet transfusion and DDAVP with resolution   Altered mental status with lethargy -Mentation has improved  -Continue BiPAP nightly -Unclear if septic encephalopathy versus uremic, patient febrile with abnormal CXR improved post HD and antibiotic therapy -Septic work-up negative thus far -Physical and occupational therapy evaluation today   ID -continue IV abx as prescibed -follow up cultures  GI/Nutrition GI PROPHYLAXIS as indicated DIET-->TF's as tolerated Constipation protocol as indicated  ENDO - ICU hypoglycemic\Hyperglycemia protocol -check FSBS per protocol   ELECTROLYTES -follow labs as needed -replace as needed -pharmacy consultation   DVT/GI PRX ordered -SCDs  TRANSFUSIONS AS NEEDED MONITOR FSBS ASSESS the need for LABS as needed   Critical care provider statement:   Critical care time (minutes): 33  Critical care time was exclusive of: Separately billable procedures and  treating other patients  Critical care was necessary to treat or prevent imminent or  life-threatening deterioration of the following conditions: Acute hypoxemic respiratory failure, ESRD, acute on chronic diastolic chf, multiple comorbid conditions.   Critical care was time spent personally by me on the following  activities: Development of treatment plan with patient or surrogate,  discussions with consultants, evaluation of patient's response to  treatment, examination of patient, obtaining history from patient or  surrogate, ordering and performing treatments and interventions, ordering  and review  of laboratory studies and re-evaluation of patient's condition  I assumed direction of critical care for this patient from another  provider in my specialty: no    This document was prepared using Dragon voice recognition software and may include unintentional dictation errors.    Ottie Glazier, M.D.  Division of [place]

## 2019-09-24 NOTE — Progress Notes (Signed)
[place], [place] 09/24/19  Subjective:  Patient completed dialysis yesterday. Tolerated well. Breathing much more comfortably today.   Objective:  Vital signs in last 24 hours:  Temp:  [98.1 F (36.7 C)-99.9 F (37.7 C)] 99.9 F (37.7 C) (11/04 0700) Pulse Rate:  [58-82] 67 (11/04 0700) Resp:  [13-27] 22 (11/04 0700) BP: (137-185)/(43-84) 144/51 (11/04 0700) SpO2:  [99 %-100 %] 100 % (11/04 0700) Weight:  [62.2 kg] 62.2 kg (11/04 0300)  Weight change: 0.1 kg Filed Weights   09/22/19 0437 09/23/19 0500 09/24/19 0300  Weight: 59.9 kg 62.1 kg 62.2 kg    Intake/Output:    Intake/Output Summary (Last 24 hours) at 09/24/2019 0932 Last data filed at 09/24/2019 0600 Gross per 24 hour  Intake 616.15 ml  Output 2260 ml  Net -1643.85 ml     Physical Exam: General: No acute distress  HEENT normocephalic, atraumatic, hearing intact  Pulm/lungs  minimal basilar rales, normal effort  CVS/Heart Regular, no rubs  Abdomen:  Soft, non distended  Extremities: No edema  Neurologic: Alert, able to follow simple commands  Skin: No acute rashes, warm dry  Access: Left forearm AVF       Basic Metabolic Panel:  Recent Labs  Lab 09/20/19 0526 09/21/19 0435 09/21/19 0441 09/22/19 0752 09/23/19 0346 09/24/19 0346  NA 138 140  --  143 143 140  K 3.7 3.9  --  3.1* 3.7 3.5  CL 99 97*  --  100 99 97*  CO2 25 26  --  30 28 31   GLUCOSE 190* 123*  --  95 122* 101*  BUN 49* 18  --  18 33* 14  CREATININE 6.54* 3.05*  --  3.05* 4.57* 2.54*  CALCIUM 7.8* 8.4*  --  7.8* 7.6* 8.2*  MG  --   --  2.4 2.0 1.9  --   PHOS  --   --   --  2.5  --   --      CBC: Recent Labs  Lab 09/20/19 0526  09/21/19 2238 09/22/19 0752 09/22/19 1645 09/23/19 0346 09/24/19 0346  WBC 15.1*   < > 8.2 6.6 9.1 7.2 6.0  NEUTROABS 12.3*  --   --   --   --   --   --   HGB 7.9*   < > 6.8* 8.0* 8.5* 7.6* 8.6*  HCT 24.5*   < > 21.2* 24.0* 26.0* 23.6* 26.4*  MCV 92.5   < >  89.5 88.2 89.7 89.4 89.2  PLT 206   < > 126* 107* 108* 105* 106*   < > = values in this interval not displayed.      Lab Results  Component Value Date   HEPBSAG NON REACTIVE 09/06/2019      Microbiology:  Recent Results (from the past 240 hour(s))  Culture, blood (routine x 2)     Status: None (Preliminary result)   Collection Time: 09/20/19  5:26 AM   Specimen: BLOOD  Result Value Ref Range Status   Specimen Description BLOOD RIGHT FOREARM  Final   Special Requests   Final    BOTTLES DRAWN AEROBIC AND ANAEROBIC Blood Culture adequate volume   Culture   Final    NO GROWTH 4 DAYS Performed at [facility], [address] [place], [place] [address]    Report Status PENDING  Incomplete  Culture, blood (routine x 2)     Status: None (Preliminary result)   Collection Time: 09/20/19  5:26 AM  Specimen: BLOOD  Result Value Ref Range Status   Specimen Description BLOOD RIGHT HAND  Final   Special Requests   Final    BOTTLES DRAWN AEROBIC AND ANAEROBIC Blood Culture adequate volume   Culture   Final    NO GROWTH 4 DAYS Performed at [facility], [address], [place], [place] [address]    Report Status PENDING  Incomplete  SARS Coronavirus 2 by RT PCR (hospital order, performed in [facility] hospital lab) Nasopharyngeal Nasopharyngeal Swab     Status: None   Collection Time: 09/20/19  5:26 AM   Specimen: Nasopharyngeal Swab  Result Value Ref Range Status   SARS Coronavirus 2 NEGATIVE NEGATIVE Final    Comment: (NOTE) If result is NEGATIVE SARS-CoV-2 target nucleic acids are NOT DETECTED. The SARS-CoV-2 RNA is generally detectable in upper and lower  respiratory specimens during the acute phase of infection. The lowest  concentration of SARS-CoV-2 viral copies this assay can detect is 250  copies / mL. A negative result does not preclude SARS-CoV-2 infection  and should not be used as the sole basis for treatment or other  patient management  decisions.  A negative result may occur with  improper specimen collection / handling, submission of specimen other  than nasopharyngeal swab, presence of viral mutation(s) within the  areas targeted by this assay, and inadequate number of viral copies  (<250 copies / mL). A negative result must be combined with clinical  observations, patient history, and epidemiological information. If result is POSITIVE SARS-CoV-2 target nucleic acids are DETECTED. The SARS-CoV-2 RNA is generally detectable in upper and lower  respiratory specimens dur ing the acute phase of infection.  Positive  results are indicative of active infection with SARS-CoV-2.  Clinical  correlation with patient history and other diagnostic information is  necessary to determine patient infection status.  Positive results do  not rule out bacterial infection or co-infection with other viruses. If result is PRESUMPTIVE POSTIVE SARS-CoV-2 nucleic acids MAY BE PRESENT.   A presumptive positive result was obtained on the submitted specimen  and confirmed on repeat testing.  While 2019 novel coronavirus  (SARS-CoV-2) nucleic acids may be present in the submitted sample  additional confirmatory testing may be necessary for epidemiological  and / or clinical management purposes  to differentiate between  SARS-CoV-2 and other Sarbecovirus currently known to infect humans.  If clinically indicated additional testing with an alternate test  methodology [phone]) is advised. The SARS-CoV-2 RNA is generally  detectable in upper and lower respiratory sp ecimens during the acute  phase of infection. The expected result is Negative. Fact Sheet for Patients:  StrictlyIdeas.no Fact Sheet for Healthcare Providers: BankingDealers.co.za This test is not yet approved or cleared by the [place] FDA and has been authorized for detection and/or diagnosis of SARS-CoV-2 by FDA under an  Emergency Use Authorization (EUA).  This EUA will remain in effect (meaning this test can be used) for the duration of the COVID-19 declaration under Section 564(b)(1) of the Act, 21 U.S.C. section 360bbb-3(b)(1), unless the authorization is terminated or revoked sooner. Performed at [facility], [address] [place], [place] [address]   MRSA PCR Screening     Status: None   Collection Time: 09/20/19 10:12 AM   Specimen: Nasopharyngeal  Result Value Ref Range Status   MRSA by PCR NEGATIVE NEGATIVE Final    Comment:        The GeneXpert MRSA Assay (FDA approved for NASAL specimens  only), is one component of a comprehensive MRSA colonization surveillance program. It is not intended to diagnose MRSA infection nor to guide or monitor treatment for MRSA infections. Performed at [facility], [address] [place], [place] [address]   CULTURE, BLOOD (ROUTINE X 2) w Reflex to ID Panel     Status: None (Preliminary result)   Collection Time: 09/22/19  6:30 PM   Specimen: BLOOD  Result Value Ref Range Status   Specimen Description BLOOD BLOOD LEFT HAND  Final   Special Requests   Final    BOTTLES DRAWN AEROBIC ONLY Blood Culture results may not be optimal due to an inadequate volume of blood received in culture bottles   Culture   Final    NO GROWTH 2 DAYS Performed at [facility], [address] [place], [place] [address]    Report Status PENDING  Incomplete  CULTURE, BLOOD (ROUTINE X 2) w Reflex to ID Panel     Status: None (Preliminary result)   Collection Time: 09/22/19  6:53 PM   Specimen: BLOOD  Result Value Ref Range Status   Specimen Description BLOOD BLOOD RIGHT HAND  Final   Special Requests   Final    BOTTLES DRAWN AEROBIC AND ANAEROBIC Blood Culture adequate volume   Culture   Final    NO GROWTH 2 DAYS Performed at [facility], [address], [place], [place] [address]    Report Status PENDING  Incomplete     Coagulation Studies: Recent Labs    09/21/19 02/14/2237 09/22/19 1645  LABPROT 15.1 14.1  INR 1.2 1.1    Urinalysis: No results for input(s): COLORURINE, LABSPEC, PHURINE, GLUCOSEU, HGBUR, BILIRUBINUR, KETONESUR, PROTEINUR, UROBILINOGEN, NITRITE, LEUKOCYTESUR in the last 72 hours.  Invalid input(s): APPERANCEUR    Imaging: Dg Chest Port 1 View  Result Date: 09/22/2019 CLINICAL DATA:  Pulmonary disease EXAM: PORTABLE CHEST 1 VIEW COMPARISON:  09/20/2019 FINDINGS: Bilateral diffuse interstitial thickening. No pleural effusion or pneumothorax. No focal consolidation. Stable cardiomediastinal silhouette. Thoracic aortic atherosclerosis. No acute osseous abnormality. IMPRESSION: Diffuse bilateral interstitial thickening likely reflecting chronic interstitial disease with possible superimposed interstitial edema or infection. Electronically Signed   By: Kathreen Devoid   On: 09/22/2019 18:04     Medications:   . sodium chloride Stopped (09/22/19 1822)  . azithromycin Stopped (09/23/19 02-14-2217)  . cefTRIAXone (ROCEPHIN)  IV Stopped (09/23/19 02/14/2099)  . nitroGLYCERIN Stopped (09/23/19 0908)   . sodium chloride   Intravenous Once  . amLODipine  10 mg Oral Daily  . aspirin  81 mg Oral Daily  . carvedilol  12.5 mg Oral BID WC  . Chlorhexidine Gluconate Cloth  6 each Topical Q0600  . epoetin (EPOGEN/PROCRIT) injection  4,000 Units Intravenous Q T,Th,Sa-HD  . furosemide  80 mg Intravenous BID  . insulin aspart  0-5 Units Subcutaneous QHS  . insulin aspart  0-9 Units Subcutaneous TID WC  . mouth rinse  15 mL Mouth Rinse BID  . pantoprazole (PROTONIX) IV  40 mg Intravenous Daily     Assessment/ Plan:  73 y.o. female with end stage renal disease on hemodialysis, hypertension, diabetes mellitus type II, overactive bladder, gout, COPD, congestive heart failure, coronary artery disease, aortic atherosclerosis  Active Problems:   Acute respiratory failure with hypoxemia ([place])  [facility] //TuTh[facility]-2// TW 60kg  #. ESRD with acute resp failure -Patient's respiratory status much improved.  Completed dialysis yesterday.  No urgent indication for dialysis today.  We will plan for  hemodialysis again tomorrow.   #. Anemia of CKD  Lab Results  Component Value Date   HGB 8.6 (L) 09/24/2019  Patient was transfused earlier this week.  Hemoglobin currently 8.6.  Continue to monitor CBC and maintain the patient on Epogen.   #. SHPTH     Component Value Date/Time   PTH 160 (H) 05/24/2019 0413   Lab Results  Component Value Date   PHOS 2.5 09/22/2019  Follow-up serum phosphorus tomorrow.       LOS: 4 July [place] 11/4/20209:32 AM  [place], [place]

## 2019-09-25 LAB — CULTURE, BLOOD (ROUTINE X 2)
Culture: NO GROWTH
Culture: NO GROWTH
Special Requests: ADEQUATE
Special Requests: ADEQUATE

## 2019-09-25 LAB — CBC WITH DIFFERENTIAL/PLATELET
Abs Immature Granulocytes: 0.02 10*3/uL (ref 0.00–0.07)
Basophils Absolute: 0 10*3/uL (ref 0.0–0.1)
Basophils Relative: 0 %
Eosinophils Absolute: 0.1 10*3/uL (ref 0.0–0.5)
Eosinophils Relative: 3 %
HCT: 26.5 % — ABNORMAL LOW (ref 36.0–46.0)
Hemoglobin: 8.6 g/dL — ABNORMAL LOW (ref 12.0–15.0)
Immature Granulocytes: 0 %
Lymphocytes Relative: 10 %
Lymphs Abs: 0.5 10*3/uL — ABNORMAL LOW (ref 0.7–4.0)
MCH: 29.3 pg (ref 26.0–34.0)
MCHC: 32.5 g/dL (ref 30.0–36.0)
MCV: 90.1 fL (ref 80.0–100.0)
Monocytes Absolute: 0.4 10*3/uL (ref 0.1–1.0)
Monocytes Relative: 7 %
Neutro Abs: 4.2 10*3/uL (ref 1.7–7.7)
Neutrophils Relative %: 80 %
Platelets: 108 10*3/uL — ABNORMAL LOW (ref 150–400)
RBC: 2.94 MIL/uL — ABNORMAL LOW (ref 3.87–5.11)
RDW: 13.8 % (ref 11.5–15.5)
WBC: 5.3 10*3/uL (ref 4.0–10.5)
nRBC: 0 % (ref 0.0–0.2)

## 2019-09-25 LAB — GLUCOSE, CAPILLARY
Glucose-Capillary: 109 mg/dL — ABNORMAL HIGH (ref 70–99)
Glucose-Capillary: 161 mg/dL — ABNORMAL HIGH (ref 70–99)
Glucose-Capillary: 108 mg/dL — ABNORMAL HIGH (ref 70–99)
Glucose-Capillary: 121 mg/dL — ABNORMAL HIGH (ref 70–99)

## 2019-09-25 LAB — BASIC METABOLIC PANEL
Anion gap: 15 (ref 5–15)
BUN: 29 mg/dL — ABNORMAL HIGH (ref 8–23)
CO2: 28 mmol/L (ref 22–32)
Calcium: 8.2 mg/dL — ABNORMAL LOW (ref 8.9–10.3)
Chloride: 96 mmol/L — ABNORMAL LOW (ref 98–111)
Creatinine, Ser: 4.26 mg/dL — ABNORMAL HIGH (ref 0.44–1.00)
GFR calc Af Amer: 11 mL/min — ABNORMAL LOW (ref >60)
GFR calc non Af Amer: 10 mL/min — ABNORMAL LOW (ref >60)
Glucose, Bld: 102 mg/dL — ABNORMAL HIGH (ref 70–99)
Potassium: 3.7 mmol/L (ref 3.5–5.1)
Sodium: 139 mmol/L (ref 135–145)

## 2019-09-25 LAB — MAGNESIUM: Magnesium: 1.9 mg/dL (ref 1.7–2.4)

## 2019-09-25 LAB — HEPATITIS B SURFACE ANTIBODY,QUALITATIVE: Hep B S Ab: REACTIVE — AB

## 2019-09-25 LAB — PHOSPHORUS: Phosphorus: 1.4 mg/dL — ABNORMAL LOW (ref 2.5–4.6)

## 2019-09-25 MED ORDER — SODIUM PHOSPHATES 45 MMOLE/15ML IV SOLN
20.0000 mmol | Freq: Once | INTRAVENOUS | Status: AC
  Administered 2019-09-25: 16:00:00 20 mmol via INTRAVENOUS
  Filled 2019-09-25: qty 6.67

## 2019-09-25 MED ORDER — POLYETHYLENE GLYCOL 3350 17 G PO PACK
17.0000 g | PACK | Freq: Every day | ORAL | Status: DC
  Administered 2019-09-25 – 2019-09-30 (×6): 17 g via ORAL
  Filled 2019-09-25 (×6): qty 1

## 2019-09-25 MED ORDER — FUROSEMIDE 80 MG PO TABS
80.0000 mg | ORAL_TABLET | Freq: Every day | ORAL | Status: DC
  Administered 2019-09-25 – 2019-09-26 (×2): 80 mg via ORAL
  Filled 2019-09-25: qty 4
  Filled 2019-09-25 (×2): qty 1

## 2019-09-25 MED ORDER — CLOPIDOGREL BISULFATE 75 MG PO TABS
75.0000 mg | ORAL_TABLET | Freq: Every day | ORAL | Status: DC
  Administered 2019-09-25 – 2019-09-30 (×6): 75 mg via ORAL
  Filled 2019-09-25 (×6): qty 1

## 2019-09-25 MED ORDER — HYDRALAZINE HCL 50 MG PO TABS
50.0000 mg | ORAL_TABLET | Freq: Three times a day (TID) | ORAL | Status: DC
  Administered 2019-09-25 – 2019-09-30 (×15): 50 mg via ORAL
  Filled 2019-09-25 (×15): qty 1

## 2019-09-25 MED ORDER — PRAVASTATIN SODIUM 20 MG PO TABS
40.0000 mg | ORAL_TABLET | Freq: Every day | ORAL | Status: DC
  Administered 2019-09-26 – 2019-09-29 (×3): 40 mg via ORAL
  Filled 2019-09-25 (×4): qty 2

## 2019-09-25 MED ORDER — HEPARIN SODIUM (PORCINE) 1000 UNIT/ML DIALYSIS
20.0000 [IU]/kg | INTRAMUSCULAR | Status: DC | PRN
  Filled 2019-09-25: qty 2

## 2019-09-25 MED ORDER — OXYBUTYNIN CHLORIDE ER 5 MG PO TB24
15.0000 mg | ORAL_TABLET | Freq: Every day | ORAL | Status: DC
  Administered 2019-09-25 – 2019-09-29 (×5): 15 mg via ORAL
  Filled 2019-09-25 (×6): qty 3

## 2019-09-25 NOTE — Progress Notes (Signed)
Pre HD Tx Assessment   09/25/19 1130  Neurological  Level of Consciousness Alert  Orientation Level Oriented X4  Respiratory  Respiratory Pattern Regular;Unlabored  Chest Assessment Chest expansion symmetrical  Bilateral Breath Sounds Clear;Diminished  R Upper  Breath Sounds Clear;Diminished  L Upper Breath Sounds Clear;Diminished  R Lower Breath Sounds Diminished  L Lower Breath Sounds Diminished  Cough Productive  Sputum Amount Small  Sputum Color Clear  Sputum Consistency Thin  Sputum How Obtained Oral  Cardiac  Pulse Regular  Heart Sounds S1, S2  Jugular Venous Distention (JVD) No  Cardiac Rhythm NSR  Antiarrhythmic device No  Vascular  R Radial Pulse +2  L Radial Pulse +2  R Dorsalis Pedis Pulse +1  L Dorsalis Pedis Pulse +1  Edema Generalized  Generalized Edema +1  RLE Edema +1  LLE Edema +1  Integumentary  Integumentary (WDL) WDL  Skin Color Appropriate for ethnicity  Skin Condition Dry  Skin Integrity Ecchymosis;Excoriated (scratch marks)  Ecchymosis Location Arm  Ecchymosis Location Orientation Bilateral  Musculoskeletal  Musculoskeletal (WDL) X  Generalized Weakness Yes  Gastrointestinal  Bowel Sounds Assessment Active  Last BM Date 09/22/19  GU Assessment  Genitourinary (WDL) X  Genitourinary Symptoms Urinary Catheter  Urine Characteristics  Urine Color Tea colored  Urine Appearance Clear  Psychosocial  Psychosocial (WDL) X  Patient Behaviors Sad;Tearful  Needs Expressed Emotional  Emotional support given Given to patient (chaplain is in the room)

## 2019-09-25 NOTE — Progress Notes (Signed)
Follow up - Critical Care Medicine Note  Patient Details:    Phyllis [name] is an 73 y.o. female lifelong never smoker with ESRD on hemodialysis Tuesday Thursday Saturday and history as noted below presented with acute hypoxic respiratory failure due to volume overload.    Patient was admitted to the ICU intubated and mechanically ventilated.  Emergent dialysis ensued.   Events: 10/31 admitted to ICU with volume overload, intubated, mechanically ventilated, emergent dialysis done. 11/01 SAT passed.  SBT passed.  Extubated 11/2 -Chest pain this morning despite nitroglycerine, cough worsening will start morphine.   11/3 patient febrile overnight, septic workup done, on empiric abx , abnormal cxr, elevated PCT 11/5 - patient was crying due to friend leaving to NH placement.   Lines, Airways, Drains: Urethral Catheter Kandis Nab, RN Latex 16 Fr. (Active)  Indication for Insertion or Continuance of Catheter Unstable critically ill patients first 24-48 hours (See Criteria) 09/21/19 1209  Site Assessment Clean;Intact 09/21/19 1209  Catheter Maintenance Bag below level of bladder;Drainage bag/tubing not touching floor;No dependent loops 09/21/19 1209  Collection Container Standard drainage bag 09/21/19 1209  Securement Method Securing device (Describe) 09/21/19 1209  Urinary Catheter Interventions (if applicable) Unclamped [id number] 1209  Output (mL) 75 mL 09/21/19 0500    Anti-infectives:  Anti-infectives (From admission, onward)   Start     Dose/Rate Route Frequency Ordered Stop   09/22/19 1800  cefTRIAXone (ROCEPHIN) 1 g in sodium chloride 0.9 % 100 mL IVPB     1 g 200 mL/hr over 30 Minutes Intravenous Every 24 hours 09/22/19 1740 09/27/19 2359   09/22/19 1800  azithromycin (ZITHROMAX) 500 mg in sodium chloride 0.9 % 250 mL IVPB     500 mg 250 mL/hr over 60 Minutes Intravenous Every 24 hours 09/22/19 1740 09/25/19 2359   09/21/19 1000  cefTRIAXone (ROCEPHIN) 2 g in sodium chloride 0.9 %  100 mL IVPB  Status:  Discontinued     2 g 200 mL/hr over 30 Minutes Intravenous Every 24 hours 09/20/19 0833 09/21/19 0936   09/21/19 1000  azithromycin (ZITHROMAX) 500 mg in sodium chloride 0.9 % 250 mL IVPB  Status:  Discontinued     500 mg 250 mL/hr over 60 Minutes Intravenous Every 24 hours 09/20/19 0833 09/21/19 0936   09/20/19 0700  cefTRIAXone (ROCEPHIN) 2 g in sodium chloride 0.9 % 100 mL IVPB  Status:  Discontinued     2 g 200 mL/hr over 30 Minutes Intravenous Every 24 hours 09/20/19 0650 09/20/19 0958   09/20/19 0700  azithromycin (ZITHROMAX) 500 mg in sodium chloride 0.9 % 250 mL IVPB  Status:  Discontinued     500 mg 250 mL/hr over 60 Minutes Intravenous Every 24 hours 09/20/19 0650 09/20/19 0957      Microbiology: Results for orders placed or performed during the hospital encounter of 09/20/19  Culture, blood (routine x 2)     Status: None   Collection Time: 09/20/19  5:26 AM   Specimen: BLOOD  Result Value Ref Range Status   Specimen Description BLOOD RIGHT FOREARM  Final   Special Requests   Final    BOTTLES DRAWN AEROBIC AND ANAEROBIC Blood Culture adequate volume   Culture   Final    NO GROWTH 5 DAYS Performed at [facility], [address] [place], [place] [address]    Report Status 09/25/2019 FINAL  Final  Culture, blood (routine x 2)     Status: None   Collection Time: 09/20/19  5:26 AM  Specimen: BLOOD  Result Value Ref Range Status   Specimen Description BLOOD RIGHT HAND  Final   Special Requests   Final    BOTTLES DRAWN AEROBIC AND ANAEROBIC Blood Culture adequate volume   Culture   Final    NO GROWTH 5 DAYS Performed at [facility], [address] [place], [place] [address]    Report Status 09/25/2019 FINAL  Final  SARS Coronavirus 2 by RT PCR (hospital order, performed in [facility] hospital lab) Nasopharyngeal Nasopharyngeal Swab     Status: None   Collection Time: 09/20/19  5:26 AM   Specimen: Nasopharyngeal Swab    Result Value Ref Range Status   SARS Coronavirus 2 NEGATIVE NEGATIVE Final    Comment: (NOTE) If result is NEGATIVE SARS-CoV-2 target nucleic acids are NOT DETECTED. The SARS-CoV-2 RNA is generally detectable in upper and lower  respiratory specimens during the acute phase of infection. The lowest  concentration of SARS-CoV-2 viral copies this assay can detect is 250  copies / mL. A negative result does not preclude SARS-CoV-2 infection  and should not be used as the sole basis for treatment or other  patient management decisions.  A negative result may occur with  improper specimen collection / handling, submission of specimen other  than nasopharyngeal swab, presence of viral mutation(s) within the  areas targeted by this assay, and inadequate number of viral copies  (<250 copies / mL). A negative result must be combined with clinical  observations, patient history, and epidemiological information. If result is POSITIVE SARS-CoV-2 target nucleic acids are DETECTED. The SARS-CoV-2 RNA is generally detectable in upper and lower  respiratory specimens dur ing the acute phase of infection.  Positive  results are indicative of active infection with SARS-CoV-2.  Clinical  correlation with patient history and other diagnostic information is  necessary to determine patient infection status.  Positive results do  not rule out bacterial infection or co-infection with other viruses. If result is PRESUMPTIVE POSTIVE SARS-CoV-2 nucleic acids MAY BE PRESENT.   A presumptive positive result was obtained on the submitted specimen  and confirmed on repeat testing.  While 2019 novel coronavirus  (SARS-CoV-2) nucleic acids may be present in the submitted sample  additional confirmatory testing may be necessary for epidemiological  and / or clinical management purposes  to differentiate between  SARS-CoV-2 and other Sarbecovirus currently known to infect humans.  If clinically indicated additional  testing with an alternate test  methodology [phone]) is advised. The SARS-CoV-2 RNA is generally  detectable in upper and lower respiratory sp ecimens during the acute  phase of infection. The expected result is Negative. Fact Sheet for Patients:  StrictlyIdeas.no Fact Sheet for Healthcare Providers: BankingDealers.co.za This test is not yet approved or cleared by the [place] FDA and has been authorized for detection and/or diagnosis of SARS-CoV-2 by FDA under an Emergency Use Authorization (EUA).  This EUA will remain in effect (meaning this test can be used) for the duration of the COVID-19 declaration under Section 564(b)(1) of the Act, 21 U.S.C. section 360bbb-3(b)(1), unless the authorization is terminated or revoked sooner. Performed at [facility], [address] [place], [place] [address]   MRSA PCR Screening     Status: None   Collection Time: 09/20/19 10:12 AM   Specimen: Nasopharyngeal  Result Value Ref Range Status   MRSA by PCR NEGATIVE NEGATIVE Final    Comment:        The GeneXpert MRSA Assay (FDA approved for  NASAL specimens only), is one component of a comprehensive MRSA colonization surveillance program. It is not intended to diagnose MRSA infection nor to guide or monitor treatment for MRSA infections. Performed at [facility], [address] [place], [place] [address]   CULTURE, BLOOD (ROUTINE X 2) w Reflex to ID Panel     Status: None (Preliminary result)   Collection Time: 09/22/19  6:30 PM   Specimen: BLOOD  Result Value Ref Range Status   Specimen Description BLOOD BLOOD LEFT HAND  Final   Special Requests   Final    BOTTLES DRAWN AEROBIC ONLY Blood Culture results may not be optimal due to an inadequate volume of blood received in culture bottles   Culture   Final    NO GROWTH 3 DAYS Performed at [facility], [address] [place], [place] [address]     Report Status PENDING  Incomplete  CULTURE, BLOOD (ROUTINE X 2) w Reflex to ID Panel     Status: None (Preliminary result)   Collection Time: 09/22/19  6:53 PM   Specimen: BLOOD  Result Value Ref Range Status   Specimen Description BLOOD BLOOD RIGHT HAND  Final   Special Requests   Final    BOTTLES DRAWN AEROBIC AND ANAEROBIC Blood Culture adequate volume   Culture   Final    NO GROWTH 3 DAYS Performed at [facility], [address] [place], [place] [address]    Report Status PENDING  Incomplete    Best Practice/Protocols:  VTE Prophylaxis: Heparin (SQ) GI Prophylaxis: Proton Pump Inhibitor Ventilator weaning    Studies: Dg Abd 1 View  Result Date: 09/20/2019 CLINICAL DATA:  OG tube placement EXAM: ABDOMEN - 1 VIEW COMPARISON:  None. FINDINGS: OG tube appears adequately positioned in the stomach with proximal sideholes just below the level of the gastroesophageal junction. Visualized bowel gas pattern is nonobstructive. No evidence of free intraperitoneal air or abnormal fluid collection. Aortic atherosclerosis. IMPRESSION: 1. OG tube appears adequately positioned in the stomach with proximal sideholes just below the level of the gastroesophageal junction. 2. Nonobstructive bowel gas pattern. 3. Aortic atherosclerosis. Electronically Signed   By: Franki Cabot M.D.   On: 09/20/2019 11:52   Ct Chest Wo Contrast  Result Date: 09/19/2019 CLINICAL DATA:  Midsternal chest pain for 2 weeks with shortness of breath. Nonsmoker without history of cancer. EXAM: CT CHEST WITHOUT CONTRAST TECHNIQUE: Multidetector CT imaging of the chest was performed following the standard protocol without IV contrast. COMPARISON:  09/05/2019 chest radiograph. Most recent CT of 09/02/2008 FINDINGS: Cardiovascular: Advanced aortic and branch vessel atherosclerosis. Tortuous thoracic aorta. Mild cardiomegaly, without pericardial effusion. Multivessel coronary artery atherosclerosis. Aortic valve  calcification. Mediastinum/Nodes: Hypoattenuating left thyroid nodule of 1.2 cm, new but nonspecific. No mediastinal or definite hilar adenopathy, given limitations of unenhanced CT. Lungs/Pleura: No pleural fluid.  Clear lungs. Upper Abdomen: Nonspecific caudate lobe enlargement. Similar. Normal imaged portions of the spleen, adrenal glands. Renal atrophy bilaterally. An upper pole left renal exophytic 1.8 cm cyst. Pancreatic tail 1.8 cm lesion on 114/2 is minimally enlarged compared to 2009 where it measured 1.5 cm. Calcification in its inferior portion. There is also a incompletely imaged pancreatic body lesion on 125/2, on the order of 1.8 cm-similar. Suggestion of peripancreatic inflammation, especially about the pancreatic tail lesion on 113/2. Cholecystectomy. Musculoskeletal: Lower thoracic spondylosis. No sternal fracture or retrosternal fluid. IMPRESSION: 1.  No acute process in the chest. 2. Coronary artery atherosclerosis. Aortic Atherosclerosis (ICD10-I70.0). 3. Pancreatic cystic  lesions, suboptimally and incompletely evaluated. Suspicion of peripancreatic edema. Correlate with symptoms to suggest acute on chronic pancreatitis. Consider follow-up with dedicated MRI or CT. 4. Chronic caudate lobe prominence, nonspecific. Correlate with any risk factors for cirrhosis. Electronically Signed   By: Abigail Miyamoto M.D.   On: 09/19/2019 12:28   Dg Chest Port 1 View  Result Date: 09/24/2019 CLINICAL DATA:  Pulmonary infiltrates. EXAM: PORTABLE CHEST 1 VIEW COMPARISON:  09/22/2019 FINDINGS: Improved appearance of interstitial and heterogeneous opacity seen on prior exams. Cardiomediastinal contours are stable with cardiac enlargement and dense aortic calcification and tortuosity. No signs of effusion or dense consolidation. No acute bone finding. IMPRESSION: 1. Improved appearance of presumed edema, potentially superimposed on chronic lung disease. 2. Stable dense aortic calcification and tortuosity.  Electronically Signed   By: Zetta Bills M.D.   On: 09/24/2019 10:41   Dg Chest Port 1 View  Result Date: 09/22/2019 CLINICAL DATA:  Pulmonary disease EXAM: PORTABLE CHEST 1 VIEW COMPARISON:  09/20/2019 FINDINGS: Bilateral diffuse interstitial thickening. No pleural effusion or pneumothorax. No focal consolidation. Stable cardiomediastinal silhouette. Thoracic aortic atherosclerosis. No acute osseous abnormality. IMPRESSION: Diffuse bilateral interstitial thickening likely reflecting chronic interstitial disease with possible superimposed interstitial edema or infection. Electronically Signed   By: Kathreen Devoid   On: 09/22/2019 18:04   Dg Chest Port 1 View  Result Date: 09/20/2019 CLINICAL DATA:  Intubation EXAM: PORTABLE CHEST 1 VIEW COMPARISON:  09/20/2019, 5:50 a.m. FINDINGS: Interval placement of endotracheal tube, tip positioned over the lower trachea, approximately 2 cm above the carina. Esophagogastric tube is position with tip and side port below the diaphragm. Mild cardiomegaly. Interval improvement in bilateral heterogeneous and interstitial opacity, most conspicuous in the right midlung. IMPRESSION: 1. Interval placement of endotracheal tube, tip positioned over the lower trachea, approximately 2 cm above the carina. Esophagogastric tube is position with tip and side port below the diaphragm. 2. Interval improvement in bilateral heterogeneous and interstitial opacity, most conspicuous in the right midlung, likely improved edema. Electronically Signed   By: Eddie Candle M.D.   On: 09/20/2019 11:20   Dg Chest Port 1 View  Result Date: 09/20/2019 CLINICAL DATA:  Intubated. Respiratory distress. EXAM: PORTABLE CHEST 1 VIEW COMPARISON:  09/05/2019 FINDINGS: The endotracheal tube tip is at the thoracic inlet. Borderline enlarged cardiac silhouette. Tortuous and calcified thoracic aorta. Extensive patchy bilateral airspace opacity, greater on the right. No pleural fluid. Thoracic spine  degenerative changes. IMPRESSION: 1. Endotracheal tube tip at the thoracic inlet. It is recommended that this be advanced 4.3 cm. 2. Extensive patchy bilateral probable pneumonia. 3. Aortic atherosclerosis. Electronically Signed   By: Claudie Revering M.D.   On: 09/20/2019 06:41   Dg Chest Port 1 View  Result Date: 09/05/2019 CLINICAL DATA:  Shortness of breath EXAM: PORTABLE CHEST 1 VIEW COMPARISON:  05/23/2019 FINDINGS: Tortuosity of the thoracic aorta with diffuse calcifications. Aorta is normal size. Low lung volumes with mild peribronchial thickening and interstitial prominence. No effusions. No acute bony abnormality. IMPRESSION: Mild peribronchial thickening and interstitial prominence may reflect bronchitic changes. Electronically Signed   By: Rolm Baptise M.D.   On: 09/05/2019 17:12    Consults: Treatment Team:  Minna Merritts, MD Anthonette Legato, MD [facility], Ander Gaster, MD  Nephrology, Dr. Candiss Norse  Subjective:    Overnight Issues: Tolerated dialysis well.  This morning he is awake alert and following commands.  Passing SBT.  Plan for extubation.  Objective:  Vital signs for last 24 hours: Temp:  [98.8 F (  37.1 C)-100 F (37.8 C)] 99.3 F (37.4 C) (11/05 1500) Pulse Rate:  [59-88] 72 (11/05 1500) Resp:  [14-27] 16 (11/05 1500) BP: (129-180)/(33-123) 156/59 (11/05 1500) SpO2:  [96 %-100 %] 99 % (11/05 1500) Weight:  [id number] kg] 63 kg (11/05 1045)  Hemodynamic parameters for last 24 hours:    Intake/Output from previous day: 11/04 0701 - 11/05 0700 In: -  Out: 190 [Urine:190]  Intake/Output this shift: Total I/O In: 360 [P.O.:360] Out: -   Vent settings for last 24 hours:    Physical Exam:  Constitutional: She appears well-developed and well-nourished. She appears ill. She is  awake and lethargic she ison bipap Chronically ill appearing  HENT:  Head: Normocephalic and atraumatic.  Mouth/Throat: Mucous membranes are pale.   Eyes: PERRL. Conjunctivae are normal. No  scleral icterus.  Neck: Neck supple. No tracheal deviation present. Cardiovascular: Normal rate and regular rhythm.  Murmur heard.  Systolic murmur is present with a grade of 3/6.  More audible today. AS murmur. AV fistula on L forearm with good palpable thrill.  Respiratory: She is intubated. She has no wheezes.  Clear lung fields  GI: Soft. Bowel sounds are normal. She exhibits no distension.  Musculoskeletal: Normal range of motion.        General: No edema.  Neurological:   Awakens easily,lethargic no overt focal deficit. Skin: Skin is warm and dry. There is pallor.  Psychiatric:       Assessment/Plan:   1.  Acute hypoxic respiratory failure:   -Due to flash pulm edema with diastolic chf and esrd. ongoing HD  2.  Acute on chronic diastolic heart failure:  BNP 1, 516; likely cause of decompensation,  S/p cardio evaluation - appreciate input  3.  End-stage renal disease:  Dialysis yesterday as per Dr. Candiss Norse  4.  Lactic acidosis due to hypoxemia: Resolved.  5.  Anemia of chronic kidney disease:  Oozing from PIV overnight  S/p DDAVP and palteletes - likely uremia induced - now improved  Best practice: GI prophylaxis with PPI, DVT prophylaxis with Heparin subcu.   MICU sign off today   LOS: 5 days       Critical care provider statement:    Critical care time (minutes):  33   Critical care time was exclusive of:  Separately billable procedures and  treating other patients   Critical care was necessary to treat or prevent imminent or  life-threatening deterioration of the following conditions:  Acute hypoxemic respiratory failure, ESRD, acute on chronic diastolic chf, multiple comorbid conditions.    Critical care was time spent personally by me on the following  activities:  Development of treatment plan with patient or surrogate,  discussions with consultants, evaluation of patient's response to  treatment, examination of patient, obtaining history from  patient or  surrogate, ordering and performing treatments and interventions, ordering  and review of laboratory studies and re-evaluation of patient's condition   I assumed direction of critical care for this patient from another  provider in my specialty: no       Ottie Glazier, M.D.  [facility] [place]

## 2019-09-25 NOTE — Progress Notes (Signed)
Pre HD Tx Note   I arrived to pt's room in ICU 3 to initiate and deliver HD Tx. Pt is A and O*4 on Room Air.  Pt BP WDL to start HD. AVF was hard to access. Pt is slightly dry. Nephrologist is notified and reduced UF to 1.5L instead of 2L.   09/25/19 1045  Hand-Off documentation  Report given to (Full Name) Newt Minion RN   Report received from (Full Name) Gonzella Lex RN  Vital Signs  Temp 100 F (37.8 C)  Temp Source  (monitor)  Pulse Rate 82  Pulse Rate Source Monitor  Resp 20  BP (!) 136/41  BP Location Right Arm  BP Method Automatic  Patient Position (if appropriate) Lying  Oxygen Therapy  SpO2 99 %  O2 Device Room Air  Pain Assessment  Pain Scale 0-10  Pain Score 0  Dialysis Weight  Weight 63 kg  Type of Weight Pre-Dialysis  Time-Out for Hemodialysis  What Procedure? HD   Pt Identifiers(min of two) First/Last Name;MRN/Account#  Correct Site? Yes  Correct Side? Yes  Correct Procedure? Yes  Consents Verified? Yes  Safety Precautions Reviewed? Yes  Engineer, civil (consulting) Number 5  Station Number  (ICU 3)  UF/Alarm Test Passed  Conductivity: Meter 13.8  Conductivity: Machine  13.8  pH 7.2  Reverse Osmosis Main  Normal Saline Lot Number [id number]  Dialyzer Lot Number [id number]  Disposable Set Lot Number [id number]  Machine Temperature 98.6 F (37 C)  Musician and Audible Yes  Blood Lines Intact and Secured Yes  Pre Treatment Patient Checks  Vascular access used during treatment Fistula  HD catheter dressing before treatment WDL  Patient is receiving dialysis in a chair  (in bed)  Hepatitis B Surface Antigen Results Negative  Date Hepatitis B Surface Antigen Drawn 09/06/19  Hepatitis B Surface Antibody  (>10)  Date Hepatitis B Surface Antibody Drawn 11/29/18  Hemodialysis Consent Verified Yes  Hemodialysis Standing Orders Initiated Yes  ECG (Telemetry) Monitor On Yes  Prime Ordered Normal Saline  Length of  DialysisTreatment -hour(s) 3.5 Hour(s)   Dialysis Treatment Comments  (Na140)  Dialyzer Elisio 17H NR  Dialysis Anticoagulant Heparin  Dialysate Flow Ordered 800  Blood Flow Rate Ordered 400 mL/min  Ultrafiltration Goal 1.5 Liters (as per Dr Holley Raring Verbal order since pt is slightly dry)  Dialysis Blood Pressure Support Ordered Albumin  Education / Care Plan  Dialysis Education Provided Yes  Documented Education in Care Plan Yes  Fistula / Graft Left Forearm Arteriovenous fistula  No Placement Date or Time found.   Placed prior to admission: Yes  Orientation: Left  Access Location: Forearm  Access Type: Arteriovenous fistula  Site Condition No complications;Painful  Fistula / Graft Assessment Present;Thrill;Bruit  Status Accessed  Needle Size 15  Drainage Description None

## 2019-09-25 NOTE — Progress Notes (Signed)
   09/25/19 1200  Clinical Encounter Type  Visited With Patient  Visit Type Follow-up;Spiritual support  Referral From Chaplain  Consult/Referral To Chaplain  Spiritual Encounters  Spiritual Needs Prayer;Emotional  Stress Factors  Patient Stress Factors Major life changes  Chaplain follow up with patient and she was still emotional about her roommate. Chaplain made pastoral presence made with active listening and empathy. Patient cried most of the visit and Chaplain prayer with her after giving her time to share her concerns.

## 2019-09-25 NOTE — Progress Notes (Signed)
HD Start Note   09/25/19 1055  Vital Signs  Temp 100 F (37.8 C)  Pulse Rate 82  Resp 14  BP (!) 136/41  BP Location Right Arm  BP Method Automatic  Patient Position (if appropriate) Lying  Oxygen Therapy  SpO2 96 %  O2 Device Room Air  Pain Assessment  Pain Scale 0-10  Pain Score 0  During Hemodialysis Assessment  Blood Flow Rate (mL/min) 400 mL/min  Arterial Pressure (mmHg) -100 mmHg  Venous Pressure (mmHg) 100 mmHg  Transmembrane Pressure (mmHg) 70 mmHg  Ultrafiltration Rate (mL/min) 430 mL/min  Dialysate Flow Rate (mL/min) 800 ml/min  Conductivity: Machine  13.8  HD Safety Checks Performed Yes  Dialysis Fluid Bolus Normal Saline  Bolus Amount (mL) 250 mL  Intra-Hemodialysis Comments Tx initiated

## 2019-09-25 NOTE — Progress Notes (Signed)
PROGRESS NOTE    Phyllis [name]  [id number] DOB: [birth year] DOA: 09/20/2019 PCP: [name], MD      Brief Narrative:  Phyllis [name] is a 73 y.o. F with ESRD on HD TThS, dCHF, AoCKD, HTN, PVD carotids, DM and moderate AS and asthma who presented with respiratory distress, found to have SpO2 60% by EMS.   In ER, arrived on CPAP, required intubation.  CXR showed edema.     Assessment & Plan:  Acute hypoxic respiratory failure From fluid overload from end stage renal failure, possible superimposed pneumonia.  Now post-dialysis, improved, still on O2.   ESRD -Consult nephrology for maintenance HD, appreciate intentions  Acute on chronic diastolic CHF Hypertension Peripheral vascular disease secondary prevention Fluid overloaded in setting of renal failure. BP elevated -Diuresis with HD, appreciate Nephrology assistance -Continue amlodipine, carvedilol, losartan -Resume hydralazine, Lasix -Continue aspirin -Resume Plavix   Possible pneumonia Started on empiric antibiotics at [facility]  Stopped after 1 dose, then developed fever and procal >3.    Still on supplemental oxygen today.  Afebrile, WBC improving. -Continue Ceftriaxone -Continue Zithromax  Diabetes Glucoses excellent -Continue SS correction insulin   Anemia of CKD Stable Hgb  -Continue EPO per Nephrology  OSA not on CPAP at home -CPAP while here  Thrombocytoepnia Stable  Other medicaitons -Continue oxybutynin         MDM and disposition: The below labs and imaging reports were reviewed and summarized above.  Medication management as above.  The patient was admitted with acute hypoxic respiratory failure from pneumonia fluid overload.    Her fluid overload is improved improved, she continues to improve from her pneumonia.  We will continue pulmonary toilet, antibiotics, and maintenance dialysis, and in the meantime she is significantly debilitated from her baseline physical  function, requires assistance for all self-cares, will require PT evaluation prior to discharge likely to SNF.       DVT prophylaxis: SCDs Code Status: Full code Family Communication:     Consultants:   Critical care  Nephrology  Procedures:   10/31 HD  11/1 HD  11/1 Echocardiogram IMPRESSIONS    1. Left ventricular ejection fraction, by visual estimation, is 60 to 65%. The left ventricle has normal function. There is mildly increased left ventricular hypertrophy.  2. Global right ventricle has normal systolic function.The right ventricular size is normal. No increase in right ventricular wall thickness.  3. Left atrial size was mild-moderately dilated.  4. Mild mitral valve regurgitation.  5. The tricuspid valve is normal in structure. Tricuspid valve regurgitation is not demonstrated.  6. The aortic valve was not well visualized. Measurements as detailed below, Moderate aortic valve stenosis by gradients, severe stenosis by estimated AVA.  7. TR signal is inadequate for assessing pulmonary artery systolic pressure.  8. Aortic valve mean gradient measures 20.6 mmHg.  9. Aortic valve peak gradient measures 32.0 mmHg. 10. Aortic valve area, by VTI measures 0.68 cm.   11/3 HD  11/4 HD      Antimicrobials:   Ceftriaxone 10/31 >>   Azithromycin 10/31 >> 11/4    Subjective: Patient is sad about her friend who is moving out.  She has no chest pain, fever, sputum production, cough, swelling, dyspnea, palpitations.  Objective: Vitals:   09/25/19 1500 09/25/19 1515 09/25/19 1520 09/25/19 1530  BP: (!) 156/59 (!) 161/43 (!) 161/43 (!) 175/46  Pulse: 72 75 75 89  Resp: 16 (!) 23 (!) 22 17  Temp: 99.3 F (37.4 C) 99.7 F (37.6  C) 99.7 F (37.6 C) 99.7 F (37.6 C)  TempSrc:      SpO2: 99% 98% 97% 96%  Weight:      Height:        Intake/Output Summary (Last 24 hours) at 09/25/2019 1645 Last data filed at 09/25/2019 1530 Gross per 24 hour  Intake 360 ml   Output 1165 ml  Net -805 ml   Filed Weights   09/24/19 0300 09/25/19 0500 09/25/19 1045  Weight: 62.2 kg 63 kg 63 kg    Examination: General appearance: Elderly adult female, alert and in no acute distress.  Appears extremely debilitated, lying in bed, on dialysis. HEENT: Anicteric, conjunctiva pink, lids and lashes normal. No nasal deformity, discharge, epistaxis.  Lips moist, oropharynx moist, no oral lesions, hearing normal.   Skin: Warm and dry.  Pale, no jaundice.  No suspicious rashes or lesions. Cardiac: RRR, nl S1-S2, no murmurs appreciated.  Capillary refill is brisk.  JVP not visible.  Trace LE edema.  Radia  pulses 2+ and symmetric. Respiratory: Respirations shallow.  CTAB without rales or wheezes. Abdomen: Abdomen soft.  No TTP or guarding. No ascites, distension, hepatosplenomegaly.   MSK: No deformities or effusions. Neuro: Awake and alert.  EOMI, moves all extremities with extreme global weakness. Speech fluent.    Psych: Sensorium intact and responding to questions, attention normal. Affect blunted, tearful.  Judgment and insight appear normal.    Data Reviewed: I have personally reviewed following labs and imaging studies:  CBC: Recent Labs  Lab 09/20/19 0526  09/22/19 0752 09/22/19 1645 09/23/19 0346 09/24/19 0346 09/25/19 0547  WBC 15.1*   < > 6.6 9.1 7.2 6.0 5.3  NEUTROABS 12.3*  --   --   --   --   --  4.2  HGB 7.9*   < > 8.0* 8.5* 7.6* 8.6* 8.6*  HCT 24.5*   < > 24.0* 26.0* 23.6* 26.4* 26.5*  MCV 92.5   < > 88.2 89.7 89.4 89.2 90.1  PLT 206   < > 107* 108* 105* 106* 108*   < > = values in this interval not displayed.   Basic Metabolic Panel: Recent Labs  Lab 09/21/19 0435 09/21/19 0441 09/22/19 0752 09/23/19 0346 09/24/19 0346 09/25/19 0547 09/25/19 1337  NA 140  --  143 143 140 139  --   K 3.9  --  3.1* 3.7 3.5 3.7  --   CL 97*  --  100 99 97* 96*  --   CO2 26  --  30 28 31 28   --   GLUCOSE 123*  --  95 122* 101* 102*  --   BUN 18  --   18 33* 14 29*  --   CREATININE 3.05*  --  3.05* 4.57* 2.54* 4.26*  --   CALCIUM 8.4*  --  7.8* 7.6* 8.2* 8.2*  --   MG  --  2.4 2.0 1.9  --  1.9  --   PHOS  --   --  2.5  --   --   --  1.4*   GFR: Estimated Creatinine Clearance: 10.3 mL/min (A) (by C-G formula based on SCr of 4.26 mg/dL (H)). Liver Function Tests: Recent Labs  Lab 09/20/19 0526 09/22/19 0752  AST 16 15  ALT 11 12  ALKPHOS 68 44  BILITOT 0.7 1.3*  PROT 6.8 5.7*  ALBUMIN 3.9 3.3*   Recent Labs  Lab 09/22/19 1353  LIPASE 17   No results for input(s): AMMONIA in the  last 168 hours. Coagulation Profile: Recent Labs  Lab 09/21/19 2238 09/22/19 1645  INR 1.2 1.1   Cardiac Enzymes: No results for input(s): CKTOTAL, CKMB, CKMBINDEX, TROPONINI in the last 168 hours. BNP (last 3 results) No results for input(s): PROBNP in the last 8760 hours. HbA1C: No results for input(s): HGBA1C in the last 72 hours. CBG: Recent Labs  Lab 09/24/19 1611 09/24/19 2135 09/25/19 0741 09/25/19 1107 09/25/19 1609  GLUCAP 130* 157* 109* 161* 108*   Lipid Profile: No results for input(s): CHOL, HDL, LDLCALC, TRIG, CHOLHDL, LDLDIRECT in the last 72 hours. Thyroid Function Tests: No results for input(s): TSH, T4TOTAL, FREET4, T3FREE, THYROIDAB in the last 72 hours. Anemia Panel: No results for input(s): VITAMINB12, FOLATE, FERRITIN, TIBC, IRON, RETICCTPCT in the last 72 hours. Urine analysis:    Component Value Date/Time   COLORURINE YELLOW (A) 11/28/2018 2146   APPEARANCEUR CLEAR (A) 11/28/2018 2146   LABSPEC 1.012 11/28/2018 2146   PHURINE 8.0 11/28/2018 2146   GLUCOSEU 50 (A) 11/28/2018 2146   HGBUR NEGATIVE 11/28/2018 2146   BILIRUBINUR NEGATIVE 11/28/2018 2146   KETONESUR 5 (A) 11/28/2018 2146   PROTEINUR >=300 (A) 11/28/2018 2146   NITRITE NEGATIVE 11/28/2018 2146   LEUKOCYTESUR NEGATIVE 11/28/2018 2146   Sepsis Labs: @LABRCNTIP (procalcitonin:4,lacticacidven:4)  ) Recent Results (from the past 240 hour(s))   Culture, blood (routine x 2)     Status: None   Collection Time: 09/20/19  5:26 AM   Specimen: BLOOD  Result Value Ref Range Status   Specimen Description BLOOD RIGHT FOREARM  Final   Special Requests   Final    BOTTLES DRAWN AEROBIC AND ANAEROBIC Blood Culture adequate volume   Culture   Final    NO GROWTH 5 DAYS Performed at [facility], [address], [place], [place] [address]    Report Status 09/25/2019 FINAL  Final  Culture, blood (routine x 2)     Status: None   Collection Time: 09/20/19  5:26 AM   Specimen: BLOOD  Result Value Ref Range Status   Specimen Description BLOOD RIGHT HAND  Final   Special Requests   Final    BOTTLES DRAWN AEROBIC AND ANAEROBIC Blood Culture adequate volume   Culture   Final    NO GROWTH 5 DAYS Performed at [facility], [address] [place],  [address]    Report Status 09/25/2019 FINAL  Final  SARS Coronavirus 2 by RT PCR (hospital order, performed in [facility] hospital lab) Nasopharyngeal Nasopharyngeal Swab     Status: None   Collection Time: 09/20/19  5:26 AM   Specimen: Nasopharyngeal Swab  Result Value Ref Range Status   SARS Coronavirus 2 NEGATIVE NEGATIVE Final    Comment: (NOTE) If result is NEGATIVE SARS-CoV-2 target nucleic acids are NOT DETECTED. The SARS-CoV-2 RNA is generally detectable in upper and lower  respiratory specimens during the acute phase of infection. The lowest  concentration of SARS-CoV-2 viral copies this assay can detect is 250  copies / mL. A negative result does not preclude SARS-CoV-2 infection  and should not be used as the sole basis for treatment or other  patient management decisions.  A negative result may occur with  improper specimen collection / handling, submission of specimen other  than nasopharyngeal swab, presence of viral mutation(s) within the  areas targeted by this assay, and inadequate number of viral copies  (<250 copies / mL). A negative result must be  combined with clinical  observations, patient history, and  epidemiological information. If result is POSITIVE SARS-CoV-2 target nucleic acids are DETECTED. The SARS-CoV-2 RNA is generally detectable in upper and lower  respiratory specimens dur ing the acute phase of infection.  Positive  results are indicative of active infection with SARS-CoV-2.  Clinical  correlation with patient history and other diagnostic information is  necessary to determine patient infection status.  Positive results do  not rule out bacterial infection or co-infection with other viruses. If result is PRESUMPTIVE POSTIVE SARS-CoV-2 nucleic acids MAY BE PRESENT.   A presumptive positive result was obtained on the submitted specimen  and confirmed on repeat testing.  While 2019 novel coronavirus  (SARS-CoV-2) nucleic acids may be present in the submitted sample  additional confirmatory testing may be necessary for epidemiological  and / or clinical management purposes  to differentiate between  SARS-CoV-2 and other Sarbecovirus currently known to infect humans.  If clinically indicated additional testing with an alternate test  methodology [phone]) is advised. The SARS-CoV-2 RNA is generally  detectable in upper and lower respiratory sp ecimens during the acute  phase of infection. The expected result is Negative. Fact Sheet for Patients:  StrictlyIdeas.no Fact Sheet for Healthcare Providers: BankingDealers.co.za This test is not yet approved or cleared by the [place] FDA and has been authorized for detection and/or diagnosis of SARS-CoV-2 by FDA under an Emergency Use Authorization (EUA).  This EUA will remain in effect (meaning this test can be used) for the duration of the COVID-19 declaration under Section 564(b)(1) of the Act, 21 U.S.C. section 360bbb-3(b)(1), unless the authorization is terminated or revoked sooner. Performed at [facility], [address] [place], [place] [address]   MRSA PCR Screening     Status: None   Collection Time: 09/20/19 10:12 AM   Specimen: Nasopharyngeal  Result Value Ref Range Status   MRSA by PCR NEGATIVE NEGATIVE Final    Comment:        The GeneXpert MRSA Assay (FDA approved for NASAL specimens only), is one component of a comprehensive MRSA colonization surveillance program. It is not intended to diagnose MRSA infection nor to guide or monitor treatment for MRSA infections. Performed at [facility], [address] [place], [place] [address]   CULTURE, BLOOD (ROUTINE X 2) w Reflex to ID Panel     Status: None (Preliminary result)   Collection Time: 09/22/19  6:30 PM   Specimen: BLOOD  Result Value Ref Range Status   Specimen Description BLOOD BLOOD LEFT HAND  Final   Special Requests   Final    BOTTLES DRAWN AEROBIC ONLY Blood Culture results may not be optimal due to an inadequate volume of blood received in culture bottles   Culture   Final    NO GROWTH 3 DAYS Performed at [facility], [address] [place], [place] [address]    Report Status PENDING  Incomplete  CULTURE, BLOOD (ROUTINE X 2) w Reflex to ID Panel     Status: None (Preliminary result)   Collection Time: 09/22/19  6:53 PM   Specimen: BLOOD  Result Value Ref Range Status   Specimen Description BLOOD BLOOD RIGHT HAND  Final   Special Requests   Final    BOTTLES DRAWN AEROBIC AND ANAEROBIC Blood Culture adequate volume   Culture   Final    NO GROWTH 3 DAYS Performed at [facility], [address] [place], [place] [address]    Report Status PENDING  Incomplete  Radiology Studies: Dg Chest Port 1 View  Result Date: 09/24/2019 CLINICAL DATA:  Pulmonary infiltrates. EXAM: PORTABLE CHEST 1 VIEW COMPARISON:  09/22/2019 FINDINGS: Improved appearance of interstitial and heterogeneous opacity seen on prior exams. Cardiomediastinal contours are stable with cardiac  enlargement and dense aortic calcification and tortuosity. No signs of effusion or dense consolidation. No acute bone finding. IMPRESSION: 1. Improved appearance of presumed edema, potentially superimposed on chronic lung disease. 2. Stable dense aortic calcification and tortuosity. Electronically Signed   By: Zetta Bills M.D.   On: 09/24/2019 10:41        Scheduled Meds: . sodium chloride   Intravenous Once  . amLODipine  10 mg Oral Daily  . aspirin  81 mg Oral Daily  . carvedilol  12.5 mg Oral BID WC  . Chlorhexidine Gluconate Cloth  6 each Topical Q0600  . clopidogrel  75 mg Oral Daily  . epoetin (EPOGEN/PROCRIT) injection  4,000 Units Intravenous Q T,Th,Sa-HD  . furosemide  80 mg Oral Daily  . hydrALAZINE  50 mg Oral Q8H  . insulin aspart  0-5 Units Subcutaneous QHS  . insulin aspart  0-9 Units Subcutaneous TID WC  . losartan  50 mg Oral Daily  . mouth rinse  15 mL Mouth Rinse BID  . oxybutynin  15 mg Oral QHS  . pantoprazole (PROTONIX) IV  40 mg Intravenous Daily  . polyethylene glycol  17 g Oral Daily  . pravastatin  40 mg Oral q1800   Continuous Infusions: . sodium chloride Stopped (09/22/19 1822)  . azithromycin 500 mg (09/24/19 1808)  . cefTRIAXone (ROCEPHIN)  IV 1 g (09/24/19 1730)  . sodium phosphate  Dextrose 5% IVPB 20 mmol (09/25/19 1600)     LOS: 5 days    Time spent: 35 minutes    Edwin Dada, MD [facility] 09/25/2019, 4:45 PM     Please page through [place]:  [email or web address] Password [email or web address] If 7PM-7AM, please contact night-coverage

## 2019-09-25 NOTE — Progress Notes (Signed)
Progress Note  [name] Date of Encounter: 09/25/2019  Primary Cardiologist: Ida Rogue, MD   Subjective   Reports shortness of breath has dramatically improved over the past week,  Very tearful, worried about her partner [name] who is in poor health " Tried the best I could to take care of her" At hemodialysis yesterday and plan for hemodialysis again today Nephrology following  Inpatient Medications    Scheduled Meds: . sodium chloride   Intravenous Once  . amLODipine  10 mg Oral Daily  . aspirin  81 mg Oral Daily  . carvedilol  12.5 mg Oral BID WC  . Chlorhexidine Gluconate Cloth  6 each Topical Q0600  . clopidogrel  75 mg Oral Daily  . epoetin (EPOGEN/PROCRIT) injection  4,000 Units Intravenous Q T,Th,Sa-HD  . furosemide  80 mg Oral Daily  . hydrALAZINE  50 mg Oral Q8H  . insulin aspart  0-5 Units Subcutaneous QHS  . insulin aspart  0-9 Units Subcutaneous TID WC  . losartan  50 mg Oral Daily  . mouth rinse  15 mL Mouth Rinse BID  . oxybutynin  15 mg Oral QHS  . pantoprazole (PROTONIX) IV  40 mg Intravenous Daily  . polyethylene glycol  17 g Oral Daily  . pravastatin  40 mg Oral q1800   Continuous Infusions: . sodium chloride Stopped (09/22/19 1822)  . azithromycin 500 mg (09/24/19 1808)  . cefTRIAXone (ROCEPHIN)  IV 1 g (09/24/19 1730)  . sodium phosphate  Dextrose 5% IVPB     PRN Meds: sodium chloride, acetaminophen, albuterol, guaiFENesin-dextromethorphan, heparin, HYDROcodone-acetaminophen, midazolam, morphine injection   Vital Signs    Vitals:   09/25/19 1500 09/25/19 1515 09/25/19 1520 09/25/19 1530  BP: (!) 156/59 (!) 161/43 (!) 161/43 (!) 175/46  Pulse: 72 75 75 89  Resp: 16 (!) 23 (!) 22 17  Temp: 99.3 F (37.4 C) 99.7 F (37.6 C) 99.7 F (37.6 C) 99.7 F (37.6 C)  TempSrc:      SpO2: 99% 98% 97% 96%  Weight:      Height:        Intake/Output Summary (Last 24 hours) at 09/25/2019 1559 Last data filed at  09/25/2019 1530 Gross per 24 hour  Intake 360 ml  Output 1165 ml  Net -805 ml   Last 3 Weights 09/25/2019 09/25/2019 09/24/2019  Weight (lbs) 138 lb 14.2 oz 138 lb 14.2 oz 137 lb 2 oz  Weight (kg) 63 kg 63 kg 62.2 kg      Telemetry    Sinus rhythm - Personally Reviewed  ECG    Not performed today-  Physical Exam   Constitutional:  oriented to person, place, and time. No distress.  Obese HENT:  Head: Grossly normal Eyes:  no discharge. No scleral icterus.  Neck: Unable to estimate JVD, no carotid bruits  Cardiovascular: Regular rate and rhythm, dullness at the bases [id number] systolic ejection murmur right sternal border Pulmonary/Chest: Clear to auscultation bilaterally, no wheezes or rales Abdominal: Soft.  no distension.  no tenderness.  Musculoskeletal: Normal range of motion Neurological:  normal muscle tone. Coordination normal. No atrophy Skin: Skin warm and dry Psychiatric: normal affect, pleasant   Labs    High Sensitivity Troponin:   Recent Labs  Lab 09/21/19 1424 09/21/19 1620 09/22/19 1054 09/22/19 1238 09/22/19 1853  TROPONINIHS 109* [place] Recent Labs  Lab 09/20/19 0526  09/22/19 [id number] 09/23/19 0346 09/24/19 0346 09/25/19 0547  NA 138   < > 143 143 140 139  K 3.7   < > 3.1* 3.7 3.5 3.7  CL 99   < > 100 99 97* 96*  CO2 25   < > 30 28 31 28   GLUCOSE 190*   < > 95 122* 101* 102*  BUN 49*   < > 18 33* 14 29*  CREATININE 6.54*   < > 3.05* 4.57* 2.54* 4.26*  CALCIUM 7.8*   < > 7.8* 7.6* 8.2* 8.2*  PROT 6.8  --  5.7*  --   --   --   ALBUMIN 3.9  --  3.3*  --   --   --   AST 16  --  15  --   --   --   ALT 11  --  12  --   --   --   ALKPHOS 68  --  44  --   --   --   BILITOT 0.7  --  1.3*  --   --   --   GFRNONAA 6*   < > 15* 9* 18* 10*  GFRAA 7*   < > 17* 10* 21* 11*  ANIONGAP 14   < > 13 16* 12 15   < > = values in this interval not displayed.     Hematology Recent Labs  Lab 09/23/19 0346 09/24/19 0346 09/25/19  0547  WBC 7.2 6.0 5.3  RBC 2.64* 2.96* 2.94*  HGB 7.6* 8.6* 8.6*  HCT 23.6* 26.4* 26.5*  MCV 89.4 89.2 90.1  MCH 28.8 29.1 29.3  MCHC 32.2 32.6 32.5  RDW 15.2 14.4 13.8  PLT 105* 106* 108*    BNP Recent Labs  Lab 09/20/19 0526  BNP 1,516.0*     DDimer No results for input(s): DDIMER in the last 168 hours.   Radiology    Dg Chest Port 1 View  Result Date: 09/24/2019 CLINICAL DATA:  Pulmonary infiltrates. EXAM: PORTABLE CHEST 1 VIEW COMPARISON:  09/22/2019 FINDINGS: Improved appearance of interstitial and heterogeneous opacity seen on prior exams. Cardiomediastinal contours are stable with cardiac enlargement and dense aortic calcification and tortuosity. No signs of effusion or dense consolidation. No acute bone finding. IMPRESSION: 1. Improved appearance of presumed edema, potentially superimposed on chronic lung disease. 2. Stable dense aortic calcification and tortuosity. Electronically Signed   By: Zetta Bills M.D.   On: 09/24/2019 10:41    Cardiac Studies   TTE (09/21/2019): 1. Left ventricular ejection fraction, by visual estimation, is 60 to 65%. The left ventricle has normal function. There is mildly increased left ventricular hypertrophy. 2. Global right ventricle has normal systolic function.The right ventricular size is normal. No increase in right ventricular wall thickness. 3. Left atrial size was mild-moderately dilated. 4. Mild mitral valve regurgitation. 5. The tricuspid valve is normal in structure. Tricuspid valve regurgitation is not demonstrated. 6. The aortic valve was not well visualized. Measurements as detailed below, Moderate aortic valve stenosis by gradients, severe stenosis by estimated AVA. 7. TR signal is inadequate for assessing pulmonary artery systolic pressure. 8. Aortic valve mean gradient measures 20.6 mmHg. 9. Aortic valve peak gradient measures 32.0 mmHg. 10. Aortic valve area, by VTI measures 0.68 cm.  Patient Profile      73 y.o. female with history of end-stage renal disease on dialysis, aortic stenosis, hyperlipidemia who presents due to shortness of breath, found to have pulmonary edema requiring intubation.  Patient is now currently extubated.  Assessment & Plan  1. Flash pulmonary edema multifactorial in the setting of end-stage renal disease/hemodialysis dependent, worsening anemia, moderate to severe aortic valve stenosis.  ---Would continue aggressive hemodialysis as she has been receiving over the past week.  Would likely benefit from low dry weight as outpatient  2.  Aortic valve stenosis Measurements appear moderate by gradient though severe by estimated aortic valve area --- very narrow therapeutic window for fluid status, leading to frequent recurrences of flash pulmonary edema --- Would work on conditioning, physical therapy --- Once conditioning improves as outpatient would consider right and left heart catheterization, even consider transesophageal echo in preparation for possible TAVR.  Candidacy for TAVR would depend on her recovery and therapy.  Currently very weak, deconditioned  3.  Anemia At transfusion, also,  Epogen with hemodialysis -Ideally goal hemoglobin of 10 Worsening anemia will contribute to flash pulmonary edema, worsening shortness of breath  5.  End-stage renal disease on hemodialysis 3 days/week Followed by nephrology, Has had aggressive diuresis through hemodialysis this admission -Would continue low dry weight as outpatient  Case discussed with critical care service, nursing Patient crying this morning, reassurance/comfort provided Distressed about her partner going to nursing facility  Total encounter time more than 35 minutes  Greater than 50% was spent in counseling and coordination of care with the patient   Signed, Ida Rogue, MD  09/25/2019, 3:59 PM

## 2019-09-25 NOTE — Progress Notes (Signed)
Pharmacy Electrolyte Monitoring Consult:  Phyllis [name] is a 39 YOF admitted on 09/20/2019 with respiratory distress. She is a lifelong never smoker with ESRD on HD (Tues, Thurs, Sat). Patient presented with acute hypoxic respiratory failure due to volume overload.  She was admitted to the ICU, intubated, and mechanically ventilated. Patient subsequently extubated and is currently stepdown status.   Patient to received dialysis on 11/5. Patient initiated on furosemide 80mg  PO Daily.   Labs:  Sodium (mmol/L)  Date Value  09/25/2019 139  12/10/2014 142   Potassium (mmol/L)  Date Value  09/25/2019 3.7  12/10/2014 3.6   Magnesium (mg/dL)  Date Value  09/25/2019 1.9   Phosphorus (mg/dL)  Date Value  09/25/2019 1.4 (L)  12/09/2014 4.4   Calcium (mg/dL)  Date Value  09/25/2019 8.2 (L)   Calcium, Total (mg/dL)  Date Value  12/10/2014 8.6   Albumin (g/dL)  Date Value  09/22/2019 3.3 (L)  12/07/2014 2.5 (L)   Electrolytes:  Assessment/Plan: - Per conversation with nephrology, will order sodium phosphate 37mmol IV x 1.  - Will check AM labs and supplement accordingly.  Goals: -Maintain electrolytes within normal limits   Glucose:  - Continue SSI as ordered; patient has received 2 units in last 24 hours.   Pharmacy will continue to monitor and adjust per consult.   Kaushik Maul L 09/25/2019 2:19 PM

## 2019-09-25 NOTE — Progress Notes (Signed)
PT Cancellation Note  Patient Details Name: YARIMAR REINHART MRN: [id number] DOB: 01/04/1946   Cancelled Treatment:    Reason Eval/Treat Not Completed: Patient at procedure or test/unavailable(Consult received and chart reviewed. Patient currently receiving dialysis.  Will re-attempt at later time/date as medically appropriate and available.)   Evann Koelzer H. Owens Shark, PT, DPT, NCS 09/25/19, 11:01 AM [phone]

## 2019-09-25 NOTE — Progress Notes (Signed)
HD TX Completed   09/25/19 1520  Vital Signs  Temp 99.7 F (37.6 C)  Pulse Rate 75  Resp (!) 22  BP (!) 161/43  Oxygen Therapy  SpO2 97 %  O2 Device Room Air  Pain Assessment  Pain Scale 0-10  Pain Score 0  During Hemodialysis Assessment  Blood Flow Rate (mL/min) 400 mL/min  Arterial Pressure (mmHg) -130 mmHg  Venous Pressure (mmHg) 130 mmHg  Transmembrane Pressure (mmHg) 70 mmHg  Ultrafiltration Rate (mL/min) 430 mL/min  Dialysate Flow Rate (mL/min) 800 ml/min  Conductivity: Machine  14  HD Safety Checks Performed Yes  Intra-Hemodialysis Comments Tx completed

## 2019-09-25 NOTE — Progress Notes (Signed)
Post HD Tx Note  Pt tolerated well the tx. Prescribed UF goal was met; 1.5L. Pt reports no chest pain or SOB. No fever. BP WDL post Tx. Pt received Epo as prescribed. Pt is on RA SPO2 96   09/25/19 1530  Hand-Off documentation  Report given to (Full Name) Madelon Lips RN   Report received from (Full Name) Newt Minion RN  Vital Signs  Temp 99.7 F (37.6 C)  Pulse Rate 89  Resp 17  BP (!) 175/46  BP Location Right Arm  BP Method Automatic  Patient Position (if appropriate) Lying  Oxygen Therapy  SpO2 96 %  O2 Device Room Air  Pain Assessment  Pain Scale 0-10  Pain Score 0  Post-Hemodialysis Assessment  Rinseback Volume (mL) 250 mL  KECN 77.4 V  Dialyzer Clearance Lightly streaked  Duration of HD Treatment -hour(s) 3.5 hour(s)  Hemodialysis Intake (mL) 500 mL  UF Total -Machine (mL) 1500 mL  Net UF (mL) 1000 mL  Tolerated HD Treatment Yes  AVG/AVF Arterial Site Held (minutes) 8 minutes  AVG/AVF Venous Site Held (minutes) 10 minutes  Fistula / Graft Left Forearm Arteriovenous fistula  No Placement Date or Time found.   Placed prior to admission: Yes  Orientation: Left  Access Location: Forearm  Access Type: Arteriovenous fistula  Site Condition No complications  Fistula / Graft Assessment Present;Thrill;Bruit  Status Deaccessed  Drainage Description None

## 2019-09-25 NOTE — Progress Notes (Signed)
OT Cancellation Note  Patient Details Name: Phyllis [name] MRN: [id number] DOB: 02/16/1946   Cancelled Treatment:    Reason Eval/Treat Not Completed: Other (comment). Due to high OT census, OT evaluation may be triaged until next date. Will continue to follow at a distance and initiate as able. Please call with any imminent needs or concerns.   Jeni Salles, MPH, MS, OTR/L ascom [phone] 09/25/19, 5:26 PM

## 2019-09-25 NOTE — Progress Notes (Signed)
[facility], [place] 09/25/19  Subjective:  Patient due for hemodialysis today. Resting comfortably at the moment. Not currently on BiPAP.   Objective:  Vital signs in last 24 hours:  Temp:  [98.8 F (37.1 C)-100 F (37.8 C)] 99.5 F (37.5 C) (11/05 0900) Pulse Rate:  [59-88] 88 (11/05 0900) Resp:  [14-29] 27 (11/05 0900) BP: (129-173)/(33-108) 147/47 (11/05 0800) SpO2:  [97 %-100 %] 97 % (11/05 0900) Weight:  [63 kg] 63 kg (11/05 0500)  Weight change: 0.8 kg Filed Weights   09/23/19 0500 09/24/19 0300 09/25/19 0500  Weight: 62.1 kg 62.2 kg 63 kg    Intake/Output:    Intake/Output Summary (Last 24 hours) at 09/25/2019 1003 Last data filed at 09/25/2019 0500 Gross per 24 hour  Intake -  Output 190 ml  Net -190 ml     Physical Exam: General: No acute distress  HEENT normocephalic, atraumatic, hearing intact  Pulm/lungs minimal basilar rales, normal effort  CVS/Heart Regular, no rubs  Abdomen:  Soft, non distended, bowel sounds present  Extremities: No edema  Neurologic: Alert, able to follow simple commands  Skin: No acute rashes, warm dry  Access: Left forearm AVF       Basic Metabolic Panel:  Recent Labs  Lab 09/21/19 0435 09/21/19 0441 09/22/19 0752 09/23/19 0346 09/24/19 0346 09/25/19 0547  NA 140  --  143 143 140 139  K 3.9  --  3.1* 3.7 3.5 3.7  CL 97*  --  100 99 97* 96*  CO2 26  --  30 28 31 28   GLUCOSE 123*  --  95 122* 101* 102*  BUN 18  --  18 33* 14 29*  CREATININE 3.05*  --  3.05* 4.57* 2.54* 4.26*  CALCIUM 8.4*  --  7.8* 7.6* 8.2* 8.2*  MG  --  2.4 2.0 1.9  --  1.9  PHOS  --   --  2.5  --   --   --      CBC: Recent Labs  Lab 09/20/19 0526  09/22/19 0752 09/22/19 1645 09/23/19 0346 09/24/19 0346 09/25/19 0547  WBC 15.1*   < > 6.6 9.1 7.2 6.0 5.3  NEUTROABS 12.3*  --   --   --   --   --  4.2  HGB 7.9*   < > 8.0* 8.5* 7.6* 8.6* 8.6*  HCT 24.5*   < > 24.0* 26.0* 23.6* 26.4* 26.5*  MCV 92.5   < >  88.2 89.7 89.4 89.2 90.1  PLT 206   < > 107* 108* 105* 106* 108*   < > = values in this interval not displayed.      Lab Results  Component Value Date   HEPBSAG NON REACTIVE 09/06/2019      Microbiology:  Recent Results (from the past 240 hour(s))  Culture, blood (routine x 2)     Status: None   Collection Time: 09/20/19  5:26 AM   Specimen: BLOOD  Result Value Ref Range Status   Specimen Description BLOOD RIGHT FOREARM  Final   Special Requests   Final    BOTTLES DRAWN AEROBIC AND ANAEROBIC Blood Culture adequate volume   Culture   Final    NO GROWTH 5 DAYS Performed at [facility], [address] [place], [place] [address]    Report Status 09/25/2019 FINAL  Final  Culture, blood (routine x 2)     Status: None   Collection Time: 09/20/19  5:26 AM   Specimen:  BLOOD  Result Value Ref Range Status   Specimen Description BLOOD RIGHT HAND  Final   Special Requests   Final    BOTTLES DRAWN AEROBIC AND ANAEROBIC Blood Culture adequate volume   Culture   Final    NO GROWTH 5 DAYS Performed at [facility], [address], [place], [place] [address]    Report Status 09/25/2019 FINAL  Final  SARS Coronavirus 2 by RT PCR (hospital order, performed in [facility] hospital lab) Nasopharyngeal Nasopharyngeal Swab     Status: None   Collection Time: 09/20/19  5:26 AM   Specimen: Nasopharyngeal Swab  Result Value Ref Range Status   SARS Coronavirus 2 NEGATIVE NEGATIVE Final    Comment: (NOTE) If result is NEGATIVE SARS-CoV-2 target nucleic acids are NOT DETECTED. The SARS-CoV-2 RNA is generally detectable in upper and lower  respiratory specimens during the acute phase of infection. The lowest  concentration of SARS-CoV-2 viral copies this assay can detect is 250  copies / mL. A negative result does not preclude SARS-CoV-2 infection  and should not be used as the sole basis for treatment or other  patient management decisions.  A negative result may occur  with  improper specimen collection / handling, submission of specimen other  than nasopharyngeal swab, presence of viral mutation(s) within the  areas targeted by this assay, and inadequate number of viral copies  (<250 copies / mL). A negative result must be combined with clinical  observations, patient history, and epidemiological information. If result is POSITIVE SARS-CoV-2 target nucleic acids are DETECTED. The SARS-CoV-2 RNA is generally detectable in upper and lower  respiratory specimens dur ing the acute phase of infection.  Positive  results are indicative of active infection with SARS-CoV-2.  Clinical  correlation with patient history and other diagnostic information is  necessary to determine patient infection status.  Positive results do  not rule out bacterial infection or co-infection with other viruses. If result is PRESUMPTIVE POSTIVE SARS-CoV-2 nucleic acids MAY BE PRESENT.   A presumptive positive result was obtained on the submitted specimen  and confirmed on repeat testing.  While 2019 novel coronavirus  (SARS-CoV-2) nucleic acids may be present in the submitted sample  additional confirmatory testing may be necessary for epidemiological  and / or clinical management purposes  to differentiate between  SARS-CoV-2 and other Sarbecovirus currently known to infect humans.  If clinically indicated additional testing with an alternate test  methodology [phone]) is advised. The SARS-CoV-2 RNA is generally  detectable in upper and lower respiratory sp ecimens during the acute  phase of infection. The expected result is Negative. Fact Sheet for Patients:  StrictlyIdeas.no Fact Sheet for Healthcare Providers: BankingDealers.co.za This test is not yet approved or cleared by the [place] FDA and has been authorized for detection and/or diagnosis of SARS-CoV-2 by FDA under an Emergency Use Authorization (EUA).  This EUA  will remain in effect (meaning this test can be used) for the duration of the COVID-19 declaration under Section 564(b)(1) of the Act, 21 U.S.C. section 360bbb-3(b)(1), unless the authorization is terminated or revoked sooner. Performed at [facility], [address] [place], [place] [address]   MRSA PCR Screening     Status: None   Collection Time: 09/20/19 10:12 AM   Specimen: Nasopharyngeal  Result Value Ref Range Status   MRSA by PCR NEGATIVE NEGATIVE Final    Comment:        The GeneXpert MRSA Assay (FDA approved for NASAL specimens  only), is one component of a comprehensive MRSA colonization surveillance program. It is not intended to diagnose MRSA infection nor to guide or monitor treatment for MRSA infections. Performed at [facility], [address] [place], [place] [address]   CULTURE, BLOOD (ROUTINE X 2) w Reflex to ID Panel     Status: None (Preliminary result)   Collection Time: 09/22/19  6:30 PM   Specimen: BLOOD  Result Value Ref Range Status   Specimen Description BLOOD BLOOD LEFT HAND  Final   Special Requests   Final    BOTTLES DRAWN AEROBIC ONLY Blood Culture results may not be optimal due to an inadequate volume of blood received in culture bottles   Culture   Final    NO GROWTH 3 DAYS Performed at [facility], [address], [place], [place] [address]    Report Status PENDING  Incomplete  CULTURE, BLOOD (ROUTINE X 2) w Reflex to ID Panel     Status: None (Preliminary result)   Collection Time: 09/22/19  6:53 PM   Specimen: BLOOD  Result Value Ref Range Status   Specimen Description BLOOD BLOOD RIGHT HAND  Final   Special Requests   Final    BOTTLES DRAWN AEROBIC AND ANAEROBIC Blood Culture adequate volume   Culture   Final    NO GROWTH 3 DAYS Performed at [facility], [address], [place],  [address]    Report Status PENDING  Incomplete    Coagulation Studies: Recent Labs     09/22/19 1645  LABPROT 14.1  INR 1.1    Urinalysis: No results for input(s): COLORURINE, LABSPEC, PHURINE, GLUCOSEU, HGBUR, BILIRUBINUR, KETONESUR, PROTEINUR, UROBILINOGEN, NITRITE, LEUKOCYTESUR in the last 72 hours.  Invalid input(s): APPERANCEUR    Imaging: Dg Chest Port 1 View  Result Date: 09/24/2019 CLINICAL DATA:  Pulmonary infiltrates. EXAM: PORTABLE CHEST 1 VIEW COMPARISON:  09/22/2019 FINDINGS: Improved appearance of interstitial and heterogeneous opacity seen on prior exams. Cardiomediastinal contours are stable with cardiac enlargement and dense aortic calcification and tortuosity. No signs of effusion or dense consolidation. No acute bone finding. IMPRESSION: 1. Improved appearance of presumed edema, potentially superimposed on chronic lung disease. 2. Stable dense aortic calcification and tortuosity. Electronically Signed   By: Zetta Bills M.D.   On: 09/24/2019 10:41     Medications:   . sodium chloride Stopped (09/22/19 1822)  . azithromycin 500 mg (09/24/19 1808)  . cefTRIAXone (ROCEPHIN)  IV 1 g (09/24/19 1730)   . sodium chloride   Intravenous Once  . amLODipine  10 mg Oral Daily  . aspirin  81 mg Oral Daily  . carvedilol  12.5 mg Oral BID WC  . Chlorhexidine Gluconate Cloth  6 each Topical Q0600  . clopidogrel  75 mg Oral Daily  . epoetin (EPOGEN/PROCRIT) injection  4,000 Units Intravenous Q T,Th,Sa-HD  . furosemide  80 mg Oral Daily  . hydrALAZINE  50 mg Oral Q8H  . insulin aspart  0-5 Units Subcutaneous QHS  . insulin aspart  0-9 Units Subcutaneous TID WC  . losartan  50 mg Oral Daily  . mouth rinse  15 mL Mouth Rinse BID  . oxybutynin  15 mg Oral QHS  . pantoprazole (PROTONIX) IV  40 mg Intravenous Daily  . polyethylene glycol  17 g Oral Daily  . pravastatin  40 mg Oral q1800     Assessment/ Plan:  73 y.o. female with end stage renal disease on hemodialysis, hypertension, diabetes mellitus type II, overactive bladder,  gout, COPD, congestive heart  failure, coronary artery disease, aortic atherosclerosis  Active Problems:   Congestive heart failure (HCC)   Acute respiratory failure with hypoxemia (HCC)   Aortic valve stenosis  [facility]// [facility] //TuThSa-2// TW 60kg  #. ESRD with acute resp failure -We will plan for hemodialysis today.  Orders have been prepared.   #. Anemia of CKD  Lab Results  Component Value Date   HGB 8.6 (L) 09/25/2019  Patient to receive Epogen 4000 units IV with dialysis treatment today.   #. SHPTH     Component Value Date/Time   PTH 160 (H) 05/24/2019 0413   Lab Results  Component Value Date   PHOS 2.5 09/22/2019  Serum phosphorus 2.5 at last check and we plan to follow this up today.       LOS: 5 Eldean Klatt 11/5/202010:03 AM  [place], [place]

## 2019-09-25 NOTE — Progress Notes (Signed)
Post HD Tx Note   09/25/19 1511  Neurological  Level of Consciousness Alert  Orientation Level Oriented X4  Respiratory  Respiratory Pattern Regular;Unlabored  Chest Assessment Chest expansion symmetrical  Bilateral Breath Sounds Clear;Diminished  R Upper  Breath Sounds Clear  L Upper Breath Sounds Clear;Diminished  R Lower Breath Sounds Clear;Diminished  L Lower Breath Sounds Diminished  Cough None  Cardiac  Pulse Regular  Heart Sounds S1, S2  Jugular Venous Distention (JVD) No  Cardiac Rhythm NSR  Antiarrhythmic device No  Vascular  R Radial Pulse +2  L Radial Pulse +2  R Dorsalis Pedis Pulse +1  L Dorsalis Pedis Pulse +1  Edema Generalized  Generalized Edema +1  RLE Edema +1  LLE Edema +1  Integumentary  Integumentary (WDL) WDL  Skin Color Appropriate for ethnicity  Skin Condition Dry  Skin Integrity Ecchymosis;Excoriated (scratch marks)  Ecchymosis Location Arm  Ecchymosis Location Orientation Bilateral  Musculoskeletal  Musculoskeletal (WDL) X  Generalized Weakness Yes  Gastrointestinal  Bowel Sounds Assessment Active  GU Assessment  Genitourinary (WDL) X  Genitourinary Symptoms Urinary Catheter  Urine Characteristics  Urine Color Tea colored  Urine Appearance Clear  Psychosocial  Psychosocial (WDL) WDL  Emotional support given  (chaplain is in the room)

## 2019-09-25 NOTE — Plan of Care (Signed)
  Problem: Clinical Measurements: Goal: Ability to maintain clinical measurements within normal limits will improve Outcome: Progressing Goal: Will remain free from infection Outcome: Progressing Goal: Diagnostic test results will improve Outcome: Progressing Goal: Respiratory complications will improve Outcome: Progressing Goal: Cardiovascular complication will be avoided Outcome: Progressing  Pt is in ICU 3 ready for HD Tx. Pt is Alert and O*4 , on room air, BP is WDL to receive Tx. Pt will receive epo 4000U for HMG of 8.6 on 09/25/2019. Pt Tx is planeed for 3.5hrs on 2K 2.5Ca and potassium is 3.7 on 09/25/2019. Will keep monitoring

## 2019-09-25 NOTE — Progress Notes (Signed)
   09/25/19 1000  Clinical Encounter Type  Visited With Patient;Health care provider  Visit Type Initial;Spiritual support  Referral From Nurse  Consult/Referral To Chaplain  Spiritual Encounters  Spiritual Needs Emotional  Stress Factors  Patient Stress Factors Major life changes  Chaplain was page to visit patient because she was emotional. Chaplain arrived at patient"s room and doctor was assessing patient. Chaplain stood outside door and patient begins to cry, so Chaplain walks in. Patient expresses her concern for a friend that she usually helps take care of but now she cannot due to her health issue and her friend has to go to a nursing facility. Chaplain made pastoral presence know with active listening  and empathy. Chaplain received another page for death and will follow up with patient afterwards.

## 2019-09-26 LAB — CBC WITH DIFFERENTIAL/PLATELET
Abs Immature Granulocytes: 0.02 10*3/uL (ref 0.00–0.07)
Basophils Absolute: 0 10*3/uL (ref 0.0–0.1)
Basophils Relative: 0 %
Eosinophils Absolute: 0.2 10*3/uL (ref 0.0–0.5)
Eosinophils Relative: 3 %
HCT: 25.7 % — ABNORMAL LOW (ref 36.0–46.0)
Hemoglobin: 8.2 g/dL — ABNORMAL LOW (ref 12.0–15.0)
Immature Granulocytes: 1 %
Lymphocytes Relative: 18 %
Lymphs Abs: 0.8 10*3/uL (ref 0.7–4.0)
MCH: 28.8 pg (ref 26.0–34.0)
MCHC: 31.9 g/dL (ref 30.0–36.0)
MCV: 90.2 fL (ref 80.0–100.0)
Monocytes Absolute: 0.3 10*3/uL (ref 0.1–1.0)
Monocytes Relative: 8 %
Neutro Abs: 3.1 10*3/uL (ref 1.7–7.7)
Neutrophils Relative %: 70 %
Platelets: 119 10*3/uL — ABNORMAL LOW (ref 150–400)
RBC: 2.85 MIL/uL — ABNORMAL LOW (ref 3.87–5.11)
RDW: 13.5 % (ref 11.5–15.5)
WBC: 4.4 10*3/uL (ref 4.0–10.5)
nRBC: 0 % (ref 0.0–0.2)

## 2019-09-26 LAB — GLUCOSE, CAPILLARY
Glucose-Capillary: 99 mg/dL (ref 70–99)
Glucose-Capillary: 100 mg/dL — ABNORMAL HIGH (ref 70–99)
Glucose-Capillary: 114 mg/dL — ABNORMAL HIGH (ref 70–99)
Glucose-Capillary: 121 mg/dL — ABNORMAL HIGH (ref 70–99)

## 2019-09-26 LAB — BASIC METABOLIC PANEL
Anion gap: 10 (ref 5–15)
BUN: 20 mg/dL (ref 8–23)
CO2: 29 mmol/L (ref 22–32)
Calcium: 7.6 mg/dL — ABNORMAL LOW (ref 8.9–10.3)
Chloride: 99 mmol/L (ref 98–111)
Creatinine, Ser: 2.75 mg/dL — ABNORMAL HIGH (ref 0.44–1.00)
GFR calc Af Amer: 19 mL/min — ABNORMAL LOW (ref >60)
GFR calc non Af Amer: 16 mL/min — ABNORMAL LOW (ref >60)
Glucose, Bld: 97 mg/dL (ref 70–99)
Potassium: 3 mmol/L — ABNORMAL LOW (ref 3.5–5.1)
Sodium: 138 mmol/L (ref 135–145)

## 2019-09-26 LAB — PHOSPHORUS: Phosphorus: 3.7 mg/dL (ref 2.5–4.6)

## 2019-09-26 LAB — MAGNESIUM: Magnesium: 1.6 mg/dL — ABNORMAL LOW (ref 1.7–2.4)

## 2019-09-26 MED ORDER — MAGNESIUM SULFATE 2 GM/50ML IV SOLN
2.0000 g | Freq: Once | INTRAVENOUS | Status: AC
  Administered 2019-09-26: 13:00:00 2 g via INTRAVENOUS
  Filled 2019-09-26: qty 50

## 2019-09-26 MED ORDER — TORSEMIDE 20 MG PO TABS
40.0000 mg | ORAL_TABLET | ORAL | Status: DC
  Administered 2019-09-28 – 2019-09-29 (×2): 40 mg via ORAL
  Filled 2019-09-26 (×2): qty 2

## 2019-09-26 MED ORDER — AMOXICILLIN-POT CLAVULANATE 875-125 MG PO TABS
1.0000 | ORAL_TABLET | Freq: Every day | ORAL | Status: AC
  Administered 2019-09-26 – 2019-09-27 (×2): 1 via ORAL
  Filled 2019-09-26 (×3): qty 1

## 2019-09-26 MED ORDER — POTASSIUM CHLORIDE CRYS ER 20 MEQ PO TBCR
20.0000 meq | EXTENDED_RELEASE_TABLET | Freq: Once | ORAL | Status: AC
  Administered 2019-09-26: 20 meq via ORAL
  Filled 2019-09-26: qty 1

## 2019-09-26 MED ORDER — STERILE WATER FOR INJECTION IJ SOLN
INTRAMUSCULAR | Status: AC
  Administered 2019-09-26: 10 mL
  Filled 2019-09-26: qty 10

## 2019-09-26 NOTE — Progress Notes (Signed)
PROGRESS NOTE    [name]  [id number] DOB: May 28, 1946 DOA: 09/20/2019 PCP: [name], MD      Brief Narrative:  [name] is a 73 y.o. F with ESRD on HD TThS, dCHF, AoCKD, HTN, PVD carotids, DM and moderate AS and asthma who presented with respiratory distress, found to have SpO2 60% by EMS.   In ER, arrived on CPAP, required intubation.  CXR showed edema.          Assessment & Plan:  Acute hypoxic respiratory failure From fluid overload from end stage renal failure, possible superimposed pneumonia.   Post-dialysis, she is weaned to room air now.   ESRD -Consult nephrology for maintenance HD, appreciate intentions  Acute on chronic diastolic CHF in setting of severe AS Given her advanced aortic stenosis, she has a tenuous fluid balance.  I agree with Dr. Rockey Situ, if her valve is not repaired in some way, she is likely to encounter frequent episodes like the current one, and her prognosis is very grim. -Fluid management with HD, appreciate Nephrology assistance -Consult Cardiology, appreciate assistance -Will stop Lasix, start torsemide on off HD days -Will need extensive rehab before candidate for TAVR   Hypertension Peripheral vascular disease secondary prevention BP still high -Continue amlodipine, carvedilol, losartan, hydralazine -Continue ASA, Plavix    Possible pneumonia Started on empiric antibiotics at admission.  Stopped after 1 dose, then developed fever and procal >3.    Afebrile, WBC normal. Patient now mentating at baseline, taking orals.  Temp < 100 F, heart rate < 100bpm, RR < 24, SpO2 at baseline.   Stable for discharge.   Has finished Zithromax 5 days -Transition CTX to Augmentin to complete 7 days    Diabetes Glucoses well controlled -Continue SS correction insulin   Anemia of CKD Stable HGb -Continue EPO per Nephrology  OSA not on CPAP at home -CPAP while here  Thrombocytoepnia Stable  Other medicaitons  -Continue oxybutynin         MDM and disposition: The below labs and imaging reports reviewed and summarized above.  Medication management as above.   The patient was admitted with acute hypoxic respiratory failure from pneumonia fluid overload.    Her fluid overload and pneumonia are resolved.  However she is significantly debilitated from her baseline physical function requiring assistance for all self-cares and substantial rehabilitation to return to her prior level of function.  Physical therapy recommended SNF placement, which we are working towards, she has no alternative safe discharge plan      DVT prophylaxis: SCDs Code Status: Full code Family Communication:     Consultants:   Critical care  Nephrology  Procedures:   10/31 HD  11/1 HD  11/1 Echocardiogram IMPRESSIONS    1. Left ventricular ejection fraction, by visual estimation, is 60 to 65%. The left ventricle has normal function. There is mildly increased left ventricular hypertrophy.  2. Global right ventricle has normal systolic function.The right ventricular size is normal. No increase in right ventricular wall thickness.  3. Left atrial size was mild-moderately dilated.  4. Mild mitral valve regurgitation.  5. The tricuspid valve is normal in structure. Tricuspid valve regurgitation is not demonstrated.  6. The aortic valve was not well visualized. Measurements as detailed below, Moderate aortic valve stenosis by gradients, severe stenosis by estimated AVA.  7. TR signal is inadequate for assessing pulmonary artery systolic pressure.  8. Aortic valve mean gradient measures 20.6 mmHg.  9. Aortic valve peak gradient measures 32.0 mmHg.  10. Aortic valve area, by VTI measures 0.68 cm.   11/3 HD  11/4 HD      Antimicrobials:   Ceftriaxone 10/31 >> 11.5   -- > Augmentin 11/6>11/7  Azithromycin 10/31 >> 11/4    Subjective: No new chest pain, fever, sputum, cough, swelling, dyspnea,  palpitations.  She is still extremely weak.       Objective: Vitals:   09/26/19 1200 09/26/19 1300 09/26/19 1342 09/26/19 1552  BP: (!) 153/49   129/89  Pulse: 81 (!) 54  72  Resp: (!) 29 (!) 27  16  Temp:  99.9 F (37.7 C)  98.6 F (37 C)  TempSrc:    Oral  SpO2: 96%  94% 98%  Weight:      Height:        Intake/Output Summary (Last 24 hours) at 09/26/2019 1633 Last data filed at 09/26/2019 1300 Gross per 24 hour  Intake 270 ml  Output 105 ml  Net 165 ml   Filed Weights   09/25/19 0500 09/25/19 1045 09/26/19 0500  Weight: 63 kg 63 kg 63.1 kg    Examination: General appearance: Frail elderly adult female, alert and in no acute distress.  Lying in recliner, appears very debilitated. HEENT: Anicteric, conjunctiva pink, lids and lashes normal. No nasal deformity, discharge, epistaxis.  Lips moist, teeth normal. OP normal, no oral lesions.   Skin: Warm and dry.  No suspicious rashes or lesions. Cardiac: RRR, actually did not appreciate a murmur.  JVP not visible, no LE edema.    Respiratory: Normal respiratory rate and rhythm.  CTAB without rales or wheezes. Abdomen: Abdomen soft.  No abdominal tenderness to palpation, no guarding. No ascites, distension, hepatosplenomegaly.   MSK: No deformities or effusions of the large joints of the upper or lower extremities bilaterally. Neuro: Awake and alert. Naming is grossly intact, and the patient's recall, recent and remote, as well as general fund of knowledge seem within normal limits.  Muscle tone very decreased, without fasciculations.  Moves all extremities with severe generalized weakness and with decreased coordination.  Marland Kitchen Speech fluent.    Psych: Sensorium intact and responding to questions, attention normal. Affect sad.  Judgment and insight appear normal.      Data Reviewed: I have personally reviewed following labs and imaging studies:  CBC: Recent Labs  Lab 09/20/19 0526  09/22/19 1645 09/23/19 0346 09/24/19 0346  09/25/19 0547 09/26/19 0335  WBC 15.1*   < > 9.1 7.2 6.0 5.3 4.4  NEUTROABS 12.3*  --   --   --   --  4.2 3.1  HGB 7.9*   < > 8.5* 7.6* 8.6* 8.6* 8.2*  HCT 24.5*   < > 26.0* 23.6* 26.4* 26.5* 25.7*  MCV 92.5   < > 89.7 89.4 89.2 90.1 90.2  PLT 206   < > 108* 105* 106* 108* 119*   < > = values in this interval not displayed.   Basic Metabolic Panel: Recent Labs  Lab 09/21/19 0441 09/22/19 0752 09/23/19 0346 09/24/19 0346 09/25/19 0547 09/25/19 1337 09/26/19 0335  NA  --  143 143 140 139  --  138  K  --  3.1* 3.7 3.5 3.7  --  3.0*  CL  --  100 99 97* 96*  --  99  CO2  --  30 28 31 28   --  29  GLUCOSE  --  95 122* 101* 102*  --  97  BUN  --  18 33* 14 29*  --  20  CREATININE  --  3.05* 4.57* 2.54* 4.26*  --  2.75*  CALCIUM  --  7.8* 7.6* 8.2* 8.2*  --  7.6*  MG 2.4 2.0 1.9  --  1.9  --  1.6*  PHOS  --  2.5  --   --   --  1.4* 3.7   GFR: Estimated Creatinine Clearance: 15.9 mL/min (A) (by C-G formula based on SCr of 2.75 mg/dL (H)). Liver Function Tests: Recent Labs  Lab 09/20/19 0526 09/22/19 0752  AST 16 15  ALT 11 12  ALKPHOS 68 44  BILITOT 0.7 1.3*  PROT 6.8 5.7*  ALBUMIN 3.9 3.3*   Recent Labs  Lab 09/22/19 1353  LIPASE 17   No results for input(s): AMMONIA in the last 168 hours. Coagulation Profile: Recent Labs  Lab 09/21/19 2238 09/22/19 1645  INR 1.2 1.1   Cardiac Enzymes: No results for input(s): CKTOTAL, CKMB, CKMBINDEX, TROPONINI in the last 168 hours. BNP (last 3 results) No results for input(s): PROBNP in the last 8760 hours. HbA1C: No results for input(s): HGBA1C in the last 72 hours. CBG: Recent Labs  Lab 09/25/19 1107 09/25/19 1609 09/25/19 2246 09/26/19 0748 09/26/19 1201  GLUCAP 161* 108* 121* 99 100*   Lipid Profile: No results for input(s): CHOL, HDL, LDLCALC, TRIG, CHOLHDL, LDLDIRECT in the last 72 hours. Thyroid Function Tests: No results for input(s): TSH, T4TOTAL, FREET4, T3FREE, THYROIDAB in the last 72 hours. Anemia  Panel: No results for input(s): VITAMINB12, FOLATE, FERRITIN, TIBC, IRON, RETICCTPCT in the last 72 hours. Urine analysis:    Component Value Date/Time   COLORURINE YELLOW (A) 11/28/2018 2146   APPEARANCEUR CLEAR (A) 11/28/2018 2146   LABSPEC 1.012 11/28/2018 2146   PHURINE 8.0 11/28/2018 2146   GLUCOSEU 50 (A) 11/28/2018 2146   HGBUR NEGATIVE 11/28/2018 2146   BILIRUBINUR NEGATIVE 11/28/2018 2146   KETONESUR 5 (A) 11/28/2018 2146   PROTEINUR >=300 (A) 11/28/2018 2146   NITRITE NEGATIVE 11/28/2018 2146   LEUKOCYTESUR NEGATIVE 11/28/2018 2146   Sepsis Labs: @LABRCNTIP (procalcitonin:4,lacticacidven:4)  ) Recent Results (from the past 240 hour(s))  Culture, blood (routine x 2)     Status: None   Collection Time: 09/20/19  5:26 AM   Specimen: BLOOD  Result Value Ref Range Status   Specimen Description BLOOD RIGHT FOREARM  Final   Special Requests   Final    BOTTLES DRAWN AEROBIC AND ANAEROBIC Blood Culture adequate volume   Culture   Final    NO GROWTH 5 DAYS Performed at [facility], [address], [place], [place] [address]    Report Status 09/25/2019 FINAL  Final  Culture, blood (routine x 2)     Status: None   Collection Time: 09/20/19  5:26 AM   Specimen: BLOOD  Result Value Ref Range Status   Specimen Description BLOOD RIGHT HAND  Final   Special Requests   Final    BOTTLES DRAWN AEROBIC AND ANAEROBIC Blood Culture adequate volume   Culture   Final    NO GROWTH 5 DAYS Performed at [facility], [address] [place], [place] [address]    Report Status 09/25/2019 FINAL  Final  SARS Coronavirus 2 by RT PCR (hospital order, performed in [facility] hospital lab) Nasopharyngeal Nasopharyngeal Swab     Status: None   Collection Time: 09/20/19  5:26 AM   Specimen: Nasopharyngeal Swab  Result Value Ref Range Status   SARS Coronavirus 2 NEGATIVE NEGATIVE Final    Comment: (NOTE) If  result is NEGATIVE SARS-CoV-2 target nucleic acids are NOT  DETECTED. The SARS-CoV-2 RNA is generally detectable in upper and lower  respiratory specimens during the acute phase of infection. The lowest  concentration of SARS-CoV-2 viral copies this assay can detect is 250  copies / mL. A negative result does not preclude SARS-CoV-2 infection  and should not be used as the sole basis for treatment or other  patient management decisions.  A negative result may occur with  improper specimen collection / handling, submission of specimen other  than nasopharyngeal swab, presence of viral mutation(s) within the  areas targeted by this assay, and inadequate number of viral copies  (<250 copies / mL). A negative result must be combined with clinical  observations, patient history, and epidemiological information. If result is POSITIVE SARS-CoV-2 target nucleic acids are DETECTED. The SARS-CoV-2 RNA is generally detectable in upper and lower  respiratory specimens dur ing the acute phase of infection.  Positive  results are indicative of active infection with SARS-CoV-2.  Clinical  correlation with patient history and other diagnostic information is  necessary to determine patient infection status.  Positive results do  not rule out bacterial infection or co-infection with other viruses. If result is PRESUMPTIVE POSTIVE SARS-CoV-2 nucleic acids MAY BE PRESENT.   A presumptive positive result was obtained on the submitted specimen  and confirmed on repeat testing.  While 2019 novel coronavirus  (SARS-CoV-2) nucleic acids may be present in the submitted sample  additional confirmatory testing may be necessary for epidemiological  and / or clinical management purposes  to differentiate between  SARS-CoV-2 and other Sarbecovirus currently known to infect humans.  If clinically indicated additional testing with an alternate test  methodology [phone]) is advised. The SARS-CoV-2 RNA is generally  detectable in upper and lower respiratory sp ecimens during  the acute  phase of infection. The expected result is Negative. Fact Sheet for Patients:  StrictlyIdeas.no Fact Sheet for Healthcare Providers: BankingDealers.co.za This test is not yet approved or cleared by the [place] FDA and has been authorized for detection and/or diagnosis of SARS-CoV-2 by FDA under an Emergency Use Authorization (EUA).  This EUA will remain in effect (meaning this test can be used) for the duration of the COVID-19 declaration under Section 564(b)(1) of the Act, 21 U.S.C. section 360bbb-3(b)(1), unless the authorization is terminated or revoked sooner. Performed at [facility], [address] [place], [place] [address]   MRSA PCR Screening     Status: None   Collection Time: 09/20/19 10:12 AM   Specimen: Nasopharyngeal  Result Value Ref Range Status   MRSA by PCR NEGATIVE NEGATIVE Final    Comment:        The GeneXpert MRSA Assay (FDA approved for NASAL specimens only), is one component of a comprehensive MRSA colonization surveillance program. It is not intended to diagnose MRSA infection nor to guide or monitor treatment for MRSA infections. Performed at [facility], [address] [place], [place] [address]   CULTURE, BLOOD (ROUTINE X 2) w Reflex to ID Panel     Status: None (Preliminary result)   Collection Time: 09/22/19  6:30 PM   Specimen: BLOOD  Result Value Ref Range Status   Specimen Description BLOOD BLOOD LEFT HAND  Final   Special Requests   Final    BOTTLES DRAWN AEROBIC ONLY Blood Culture results may not be optimal due to an inadequate volume of blood received in culture bottles   Culture   Final  NO GROWTH 3 DAYS Performed at [facility], [address] [place], [place] [address]    Report Status PENDING  Incomplete  CULTURE, BLOOD (ROUTINE X 2) w Reflex to ID Panel     Status: None (Preliminary result)   Collection Time: 09/22/19  6:53 PM    Specimen: BLOOD  Result Value Ref Range Status   Specimen Description BLOOD BLOOD RIGHT HAND  Final   Special Requests   Final    BOTTLES DRAWN AEROBIC AND ANAEROBIC Blood Culture adequate volume   Culture   Final    NO GROWTH 3 DAYS Performed at [facility], [address], [place], [place] [address]    Report Status PENDING  Incomplete         Radiology Studies: No results found.      Scheduled Meds: . sodium chloride   Intravenous Once  . amLODipine  10 mg Oral Daily  . amoxicillin-clavulanate  1 tablet Oral Daily  . aspirin  81 mg Oral Daily  . carvedilol  12.5 mg Oral BID WC  . Chlorhexidine Gluconate Cloth  6 each Topical Q0600  . clopidogrel  75 mg Oral Daily  . epoetin (EPOGEN/PROCRIT) injection  4,000 Units Intravenous Q T,Th,Sa-HD  . furosemide  80 mg Oral Daily  . hydrALAZINE  50 mg Oral Q8H  . insulin aspart  0-5 Units Subcutaneous QHS  . insulin aspart  0-9 Units Subcutaneous TID WC  . losartan  50 mg Oral Daily  . mouth rinse  15 mL Mouth Rinse BID  . oxybutynin  15 mg Oral QHS  . pantoprazole (PROTONIX) IV  40 mg Intravenous Daily  . polyethylene glycol  17 g Oral Daily  . pravastatin  40 mg Oral q1800   Continuous Infusions: . sodium chloride Stopped (09/22/19 1822)     LOS: 6 days    Time spent: 25 minutes   Edwin Dada, MD [facility] 09/26/2019, 4:33 PM     Please page through [place]:  [email or web address] Password [email or web address] If 7PM-7AM, please contact night-coverage

## 2019-09-26 NOTE — TOC Initial Note (Signed)
Transition of Care [facility]) - Initial/Assessment Note    Patient Details  Name: Phyllis [name] MRN: [id number] Date of Birth: 1946-08-30  Transition [facility] CM/SW Contact:    Annamaria Boots, [place] Phone Number: 09/26/2019, 10:37 AM  Clinical Narrative:   Patient is at extreme risk for readmission. CSW met with patient to discuss discharge planning. Patient lives with her friend [name] that she helps care for. Patient states that [name] has lived with her for several years and has aphasia. Patient reports that she normally walks with a walker at home. She is also a dialysis patient. Patient reports that she still drives sometimes but also has transportation provided by friends. Patient is active with [facility] and would prefer to continue using them when she goes home. Patient states that she has been to rehab facility before [facility]) and she would prefer to go home. Patient states that she needs to get home to help care for her friend. Patient reports that her friend is currently being care for by someone from [facility]) and she is worried that they may place her into a nursing facility. Patient is tearful and very worried that her friend may be placed in a facility and could get Covid as a result. CSW helped patient talk through her feelings and worries. CSW also will follow up with patient's friend [name] to find out about her roommate. Patient is currently saying that she will not go to a rehab facility if that is the recommendation because she is worried about getting Covid. CSW will continue to follow for discharge planning needs.                   Expected Discharge Plan: [place] Barriers to Discharge: Continued Medical Work up   Patient Goals and CMS Choice Patient states their goals for this hospitalization and ongoing recovery are:: Go home CMS Medicare.gov Compare Post Acute Care list provided to:: Patient Choice offered to / list  presented to : Patient  Expected Discharge Plan and Services Expected Discharge Plan: [place] In-house Referral: Clinical Social Work   Post Acute Care Choice: [place] arrangements for the past 2 months: [place]: [place] ([place])        Prior Living Arrangements/Services Living arrangements for the past 2 months: [place] Lives with:: Friends Patient language and need for interpreter reviewed:: Yes Do you feel safe going back to the place where you live?: Yes      Need for Family Participation in Patient Care: No (Comment) Care giver support system in place?: Yes (comment) Current home services: Home PT, Homehealth aide Criminal Activity/Legal Involvement Pertinent to Current Situation/Hospitalization: No - Comment as needed  Activities of Daily Living Home Assistive Devices/Equipment: Environmental consultant (specify type)    Permission Sought/Granted Permission sought to share information with : Case Freight forwarder, Customer service manager, Family Supports Permission granted to share information with : Yes, Verbal Permission Granted              Emotional Assessment Appearance:: Appears stated age Attitude/Demeanor/Rapport: Engaged, Crying Affect (typically observed): Sad Orientation: : Oriented to Self, Oriented to Place, Oriented to  Time, Oriented to Situation Alcohol / Substance Use: Not Applicable Psych Involvement: No (comment)  Admission diagnosis:  Respiratory distress [R06.03] Hypoxia [R09.02] COPD exacerbation (HCC) [J44.1] ESRD (end stage renal disease) on dialysis ([place]) [N18.6, Z99.2] Community acquired pneumonia, unspecified laterality [J18.9] Congestive heart failure, unspecified HF chronicity, unspecified heart failure type [facility]) [I50.9] Patient Active Problem List   Diagnosis Date Noted  . Aortic valve stenosis   . Acute respiratory failure with  hypoxemia ([place]) 09/20/2019  . COPD with acute bronchitis ([place]) 09/05/2019  . Congestive heart failure ([place]) 05/24/2019  . Hyperkalemia 11/29/2018  . Chronic diastolic heart failure ([place]) 11/02/2018  . HTN (hypertension) 11/02/2018  . Uncontrolled hypertension 10/21/2018  . Pressure injury of skin 03/16/2018  . CVA (cerebral vascular accident) ([place]) 01/16/2018  . Aortic atherosclerosis ([place]) 12/11/2017  . Chest pain 09/18/2017  . Hyperlipidemia 08/06/2015  . Complication from renal dialysis device 04/12/2015  . SOB (shortness of breath) 02/01/2015  . End stage renal disease on dialysis ([place]) 02/01/2015  . Type 2 diabetes mellitus with other specified complication ([place]) 31/42/7670  . Asthma 02/01/2015  . Acute on chronic diastolic CHF (congestive heart failure) ([place]) 02/01/2015   PCP:  [name], MD Pharmacy:   [facility], [place] [address] [place] [address] Phone: [phone] Fax: [place], [place] [address] [place] [address] Phone: [phone] Fax: [phone]  [id number] [place] [address] [place] [place]Phone: [phone]: [phone]    Social Determinants of Health (SDOH) Interventions    Readmission Risk Interventions Readmission Risk Prevention Plan 05/07/2019  Transportation Screening Complete  Medication Review ([place] Complete  PCP or Specialist appointment within 3-5 days of discharge Complete  HRI or [place] Post[place] Applicable  Some recent data might be hidden

## 2019-09-26 NOTE — Progress Notes (Signed)
Pharmacy Electrolyte Monitoring Consult:  Phyllis [name] is a 26 YOF admitted on 09/20/2019 with respiratory distress. She is a lifelong never smoker with ESRD on HD (Tues, Thurs, Sat). Patient presented with acute hypoxic respiratory failure due to volume overload.  She was admitted to the ICU, intubated, and mechanically ventilated. Patient subsequently extubated and is currently stepdown status.   Patient received dialysis on 11/5. Patient to receive torsemide 40mg  daily on non-dialysis days.  Labs:  Sodium (mmol/L)  Date Value  09/26/2019 138  12/10/2014 142   Potassium (mmol/L)  Date Value  09/26/2019 3.0 (L)  12/10/2014 3.6   Magnesium (mg/dL)  Date Value  09/26/2019 1.6 (L)   Phosphorus (mg/dL)  Date Value  09/26/2019 3.7  12/09/2014 4.4   Calcium (mg/dL)  Date Value  09/26/2019 7.6 (L)   Calcium, Total (mg/dL)  Date Value  12/10/2014 8.6   Albumin (g/dL)  Date Value  09/22/2019 3.3 (L)  12/07/2014 2.5 (L)   Corrected Calcium: 8.2 mg/dL  Electrolytes:  Assessment/Plan: - Patient received Mg 2g IV. - Patient received  K 20 mEq x 1 per conversation with nephrology. - Will check BMP and Mg with AM labs and supplement accordingly.  Goals: -Maintain electrolytes within normal limits   Glucose:  - Continue SSI as ordered; patient did not require any insulin yesterday.  Pharmacy will continue to monitor and adjust per consult.   Raiford Simmonds, PharmD Candidate 09/26/2019 11:17 AM

## 2019-09-26 NOTE — Evaluation (Signed)
Physical Therapy Evaluation Patient Details Name: Phyllis [name] MRN: [id number] DOB: 01-16-1946 Today's Date: 09/26/2019   History of Present Illness  73yo female admitted for acute hypoxic respiratory failure due to volume overload. Intubated and mechanically ventilated in the ICU on 10/31, emergent dialysis done, extubated on 11/1.  Clinical Impression  Upon evaluation, patient alert and oriented; follows commands and demonstrates good effort with mobility tasks.  Intermittently tearful throughout session (concerned live-in friend may have to go to STR); provided with support encouragement as appropriate.  Bilat UE/LE strength and ROM generally deconditioned, but [facility] for basic transfers and gait efforts.  No focal weakness appreciated, no pain reported.  Able to complete bed mobility with min/mod assist; sit/stand, basic transfers and gait (15') with RW, min/mod assist.  Demonstrates partially reciprocal stepping pattern with short, choppy steps; limited balance and overall functional activity tolerance.  Distance limited by fatigue, weakness in bilat LEs; sats >94% on RA throughout.  Lacks endurance and functional indep necessary to facilitate safe discharge home at this time. Would benefit from skilled PT to address above deficits and promote optimal return to PLOF.; recommend transition to STR upon discharge from acute hospitalization.      Follow Up Recommendations SNF    Equipment Recommendations       Recommendations for Other Services       Precautions / Restrictions Precautions Precautions: Fall Restrictions Weight Bearing Restrictions: No      Mobility  Bed Mobility Overal bed mobility: Needs Assistance Bed Mobility: Supine to Sit     Supine to sit: Min assist;Mod assist Sit to supine: Mod assist   General bed mobility comments: increased time, effort required to complete  Transfers Overall transfer level: Needs assistance Equipment used: Rolling walker (2  wheeled) Transfers: Sit to/from Stand Sit to Stand: Min assist;Mod assist         General transfer comment: cuing for hand placement; assist for lift off, standing balance  Ambulation/Gait Ambulation/Gait assistance: Min guard;Min assist Gait Distance (Feet): (5' x1, 15' x1) Assistive device: Rolling walker (2 wheeled)       General Gait Details: partially reciprocal stepping pattern with short, choppy steps; limited balance and overall functional activity tolerance.  Distance limited by fatigue, weakness in bilat LEs; sats >94% on RA throughout  Stairs            Wheelchair Mobility    Modified Rankin (Stroke Patients Only)       Balance Overall balance assessment: Needs assistance Sitting-balance support: No upper extremity supported;Feet supported Sitting balance-Leahy Scale: Fair Sitting balance - Comments: difficulty with sitting balance/posture due to surface height (and patient short stature) Postural control: Posterior lean Standing balance support: Bilateral upper extremity supported Standing balance-Leahy Scale: Fair                               Pertinent Vitals/Pain Pain Assessment: No/denies pain    Home Living Family/patient expects to be discharged to:: Private residence Living Arrangements: Spouse/significant other Available Help at Discharge: Friend(s);Available PRN/intermittently Type of Home: House Home Access: Stairs to enter Entrance Stairs-Rails: Right Entrance Stairs-Number of Steps: 4 Home Layout: Multi-level;Able to live on main level with bedroom/bathroom Home Equipment: Gilford Rile - 2 wheels;Shower seat;Other (comment) Additional Comments: lift recliner    Prior Function Level of Independence: Independent with assistive device(s)         Comments: Pt mod indep with RW in the home, [facility] for longer community  distances; driving; sleeps in lift recliner, no falls in past 12 months; enjoy doing stained glass art as a hobby      Hand Dominance   Dominant Hand: Right    Extremity/Trunk Assessment   Upper Extremity Assessment Upper Extremity Assessment: Overall WFL for tasks assessed    Lower Extremity Assessment Lower Extremity Assessment: Overall WFL for tasks assessed(grossly 4-/5 throughout)       Communication   Communication: No difficulties  Cognition Arousal/Alertness: Awake/alert Behavior During Therapy: WFL for tasks assessed/performed Overall Cognitive Status: Within Functional Limits for tasks assessed                                 General Comments: intermittently tearful throughout session      General Comments General comments (skin integrity, edema, etc.): VSS throughout, pt reporting fatigue near end of sitting approx 10 min    Exercises Other Exercises Other Exercises: sitting balance activity requiring CGA to close supervision for balance and reaching in all planes   Assessment/Plan    PT Assessment Patient needs continued PT services  PT Problem List Decreased strength;Decreased activity tolerance;Decreased mobility;Pain;Decreased balance;Cardiopulmonary status limiting activity;Decreased knowledge of precautions;Decreased knowledge of use of DME;Decreased safety awareness       PT Treatment Interventions      PT Goals (Current goals can be found in the Care Plan section)  Acute Rehab PT Goals Patient Stated Goal: go home, care for friend in the home PT Goal Formulation: With patient Time For Goal Achievement: 10/10/19 Potential to Achieve Goals: Good    Frequency Min 2X/week   Barriers to discharge Decreased caregiver support      Co-evaluation               AM-PAC PT "6 Clicks" Mobility  Outcome Measure Help needed turning from your back to your side while in a flat bed without using bedrails?: A Little Help needed moving from lying on your back to sitting on the side of a flat bed without using bedrails?: A Little Help needed moving  to and from a bed to a chair (including a wheelchair)?: A Little Help needed standing up from a chair using your arms (e.g., wheelchair or bedside chair)?: A Little Help needed to walk in hospital room?: A Little Help needed climbing 3-5 steps with a railing? : A Little 6 Click Score: 18    End of Session Equipment Utilized During Treatment: Gait belt Activity Tolerance: Patient limited by fatigue Patient left: in chair;with call bell/phone within reach Nurse Communication: Mobility status PT Visit Diagnosis: Muscle weakness (generalized) (M62.81);Difficulty in walking, not elsewhere classified (R26.2)    Time: [id number] PT Time Calculation (min) (ACUTE ONLY): 34 min   Charges:   PT Evaluation $PT Eval Moderate Complexity: 1 Mod PT Treatments $Gait Training: 8-22 mins        Katianna Mcclenney H. Owens Shark, PT, DPT, NCS 09/26/19, 1:51 PM [phone]

## 2019-09-26 NOTE — Progress Notes (Signed)
[facility] [place], [place] 09/26/19  Subjective:  Overall patient feeling much better. Much less short of breath as compared to admission. Underwent dialysis treatment yesterday.  Objective:  Vital signs in last 24 hours:  Temp:  [98.6 F (37 C)-100.6 F (38.1 C)] 98.6 F (37 C) (11/06 0600) Pulse Rate:  [68-89] 77 (11/06 0600) Resp:  [14-28] 23 (11/06 0600) BP: (129-180)/(39-124) 136/41 (11/06 0632) SpO2:  [92 %-100 %] 93 % (11/06 0600) Weight:  [63 kg-63.1 kg] 63.1 kg (11/06 0500)  Weight change: 0 kg Filed Weights   09/25/19 0500 09/25/19 1045 09/26/19 0500  Weight: 63 kg 63 kg 63.1 kg    Intake/Output:    Intake/Output Summary (Last 24 hours) at 09/26/2019 0906 Last data filed at 09/26/2019 0400 Gross per 24 hour  Intake 225 ml  Output 1045 ml  Net -820 ml     Physical Exam: General: No acute distress  HEENT normocephalic, atraumatic, hearing intact  Pulm/lungs clear bilateral, normal effort  CVS/Heart Regular, no rubs  Abdomen:  Soft, non distended  Extremities: No edema  Neurologic: Alert, able to follow simple commands  Skin: No acute rashes, warm dry  Access: Left forearm AVF       Basic Metabolic Panel:  Recent Labs  Lab 09/21/19 0441 09/22/19 0752 09/23/19 0346 09/24/19 0346 09/25/19 0547 09/25/19 1337 09/26/19 0335  NA  --  143 143 140 139  --  138  K  --  3.1* 3.7 3.5 3.7  --  3.0*  CL  --  100 99 97* 96*  --  99  CO2  --  30 28 31 28   --  29  GLUCOSE  --  95 122* 101* 102*  --  97  BUN  --  18 33* 14 29*  --  20  CREATININE  --  3.05* 4.57* 2.54* 4.26*  --  2.75*  CALCIUM  --  7.8* 7.6* 8.2* 8.2*  --  7.6*  MG 2.4 2.0 1.9  --  1.9  --  1.6*  PHOS  --  2.5  --   --   --  1.4* 3.7     CBC: Recent Labs  Lab 09/20/19 0526  09/22/19 1645 09/23/19 0346 09/24/19 0346 09/25/19 0547 09/26/19 0335  WBC 15.1*   < > 9.1 7.2 6.0 5.3 4.4  NEUTROABS 12.3*  --   --   --   --  4.2 3.1  HGB 7.9*   < > 8.5* 7.6* 8.6*  8.6* 8.2*  HCT 24.5*   < > 26.0* 23.6* 26.4* 26.5* 25.7*  MCV 92.5   < > 89.7 89.4 89.2 90.1 90.2  PLT 206   < > 108* 105* 106* 108* 119*   < > = values in this interval not displayed.      Lab Results  Component Value Date   HEPBSAG NON REACTIVE 09/06/2019   HEPBSAB Reactive (A) 09/25/2019      Microbiology:  Recent Results (from the past 240 hour(s))  Culture, blood (routine x 2)     Status: None   Collection Time: 09/20/19  5:26 AM   Specimen: BLOOD  Result Value Ref Range Status   Specimen Description BLOOD RIGHT FOREARM  Final   Special Requests   Final    BOTTLES DRAWN AEROBIC AND ANAEROBIC Blood Culture adequate volume   Culture   Final    NO GROWTH 5 DAYS Performed at [facility], [address] [place], [place] [address]  Report Status 09/25/2019 FINAL  Final  Culture, blood (routine x 2)     Status: None   Collection Time: 09/20/19  5:26 AM   Specimen: BLOOD  Result Value Ref Range Status   Specimen Description BLOOD RIGHT HAND  Final   Special Requests   Final    BOTTLES DRAWN AEROBIC AND ANAEROBIC Blood Culture adequate volume   Culture   Final    NO GROWTH 5 DAYS Performed at [facility], [address], [place], [place] [address]    Report Status 09/25/2019 FINAL  Final  SARS Coronavirus 2 by RT PCR (hospital order, performed in [place] hospital lab) Nasopharyngeal Nasopharyngeal Swab     Status: None   Collection Time: 09/20/19  5:26 AM   Specimen: Nasopharyngeal Swab  Result Value Ref Range Status   SARS Coronavirus 2 NEGATIVE NEGATIVE Final    Comment: (NOTE) If result is NEGATIVE SARS-CoV-2 target nucleic acids are NOT DETECTED. The SARS-CoV-2 RNA is generally detectable in upper and lower  respiratory specimens during the acute phase of infection. The lowest  concentration of SARS-CoV-2 viral copies this assay can detect is 250  copies / mL. A negative result does not preclude SARS-CoV-2 infection  and should not  be used as the sole basis for treatment or other  patient management decisions.  A negative result may occur with  improper specimen collection / handling, submission of specimen other  than nasopharyngeal swab, presence of viral mutation(s) within the  areas targeted by this assay, and inadequate number of viral copies  (<250 copies / mL). A negative result must be combined with clinical  observations, patient history, and epidemiological information. If result is POSITIVE SARS-CoV-2 target nucleic acids are DETECTED. The SARS-CoV-2 RNA is generally detectable in upper and lower  respiratory specimens dur ing the acute phase of infection.  Positive  results are indicative of active infection with SARS-CoV-2.  Clinical  correlation with patient history and other diagnostic information is  necessary to determine patient infection status.  Positive results do  not rule out bacterial infection or co-infection with other viruses. If result is PRESUMPTIVE POSTIVE SARS-CoV-2 nucleic acids MAY BE PRESENT.   A presumptive positive result was obtained on the submitted specimen  and confirmed on repeat testing.  While 2019 novel coronavirus  (SARS-CoV-2) nucleic acids may be present in the submitted sample  additional confirmatory testing may be necessary for epidemiological  and / or clinical management purposes  to differentiate between  SARS-CoV-2 and other Sarbecovirus currently known to infect humans.  If clinically indicated additional testing with an alternate test  methodology [phone]) is advised. The SARS-CoV-2 RNA is generally  detectable in upper and lower respiratory sp ecimens during the acute  phase of infection. The expected result is Negative. Fact Sheet for Patients:  StrictlyIdeas.no Fact Sheet for Healthcare Providers: BankingDealers.co.za This test is not yet approved or cleared by the [place] FDA and has been authorized  for detection and/or diagnosis of SARS-CoV-2 by FDA under an Emergency Use Authorization (EUA).  This EUA will remain in effect (meaning this test can be used) for the duration of the COVID-19 declaration under Section 564(b)(1) of the Act, 21 U.S.C. section 360bbb-3(b)(1), unless the authorization is terminated or revoked sooner. Performed at [facility], [address], [place], [place] [address]   MRSA PCR Screening     Status: None   Collection Time: 09/20/19 10:12 AM   Specimen: Nasopharyngeal  Result Value Ref Range  Status   MRSA by PCR NEGATIVE NEGATIVE Final    Comment:        The GeneXpert MRSA Assay (FDA approved for NASAL specimens only), is one component of a comprehensive MRSA colonization surveillance program. It is not intended to diagnose MRSA infection nor to guide or monitor treatment for MRSA infections. Performed at [facility], [address] [place], [place] [address]   CULTURE, BLOOD (ROUTINE X 2) w Reflex to ID Panel     Status: None (Preliminary result)   Collection Time: 09/22/19  6:30 PM   Specimen: BLOOD  Result Value Ref Range Status   Specimen Description BLOOD BLOOD LEFT HAND  Final   Special Requests   Final    BOTTLES DRAWN AEROBIC ONLY Blood Culture results may not be optimal due to an inadequate volume of blood received in culture bottles   Culture   Final    NO GROWTH 3 DAYS Performed at [facility], [address], [place], [place] [address]    Report Status PENDING  Incomplete  CULTURE, BLOOD (ROUTINE X 2) w Reflex to ID Panel     Status: None (Preliminary result)   Collection Time: 09/22/19  6:53 PM   Specimen: BLOOD  Result Value Ref Range Status   Specimen Description BLOOD BLOOD RIGHT HAND  Final   Special Requests   Final    BOTTLES DRAWN AEROBIC AND ANAEROBIC Blood Culture adequate volume   Culture   Final    NO GROWTH 3 DAYS Performed at [facility], [address]  [place], [place] [address]    Report Status PENDING  Incomplete    Coagulation Studies: No results for input(s): LABPROT, INR in the last 72 hours.  Urinalysis: No results for input(s): COLORURINE, LABSPEC, PHURINE, GLUCOSEU, HGBUR, BILIRUBINUR, KETONESUR, PROTEINUR, UROBILINOGEN, NITRITE, LEUKOCYTESUR in the last 72 hours.  Invalid input(s): APPERANCEUR    Imaging: Dg Chest Port 1 View  Result Date: 09/24/2019 CLINICAL DATA:  Pulmonary infiltrates. EXAM: PORTABLE CHEST 1 VIEW COMPARISON:  09/22/2019 FINDINGS: Improved appearance of interstitial and heterogeneous opacity seen on prior exams. Cardiomediastinal contours are stable with cardiac enlargement and dense aortic calcification and tortuosity. No signs of effusion or dense consolidation. No acute bone finding. IMPRESSION: 1. Improved appearance of presumed edema, potentially superimposed on chronic lung disease. 2. Stable dense aortic calcification and tortuosity. Electronically Signed   By: Zetta Bills M.D.   On: 09/24/2019 10:41     Medications:   . sodium chloride Stopped (09/22/19 1822)   . sodium chloride   Intravenous Once  . amLODipine  10 mg Oral Daily  . amoxicillin-clavulanate  1 tablet Oral Daily  . aspirin  81 mg Oral Daily  . carvedilol  12.5 mg Oral BID WC  . Chlorhexidine Gluconate Cloth  6 each Topical Q0600  . clopidogrel  75 mg Oral Daily  . epoetin (EPOGEN/PROCRIT) injection  4,000 Units Intravenous Q T,Th,Sa-HD  . furosemide  80 mg Oral Daily  . hydrALAZINE  50 mg Oral Q8H  . insulin aspart  0-5 Units Subcutaneous QHS  . insulin aspart  0-9 Units Subcutaneous TID WC  . losartan  50 mg Oral Daily  . mouth rinse  15 mL Mouth Rinse BID  . oxybutynin  15 mg Oral QHS  . pantoprazole (PROTONIX) IV  40 mg Intravenous Daily  . polyethylene glycol  17 g Oral Daily  . pravastatin  40 mg Oral q1800     Assessment/ Plan:  73  y.o. female with end stage renal disease on hemodialysis, hypertension, diabetes  mellitus type II, overactive bladder, gout, COPD, congestive heart failure, coronary artery disease, aortic atherosclerosis  Active Problems:   Congestive heart failure (HCC)   Acute respiratory failure with hypoxemia (HCC)   Aortic valve stenosis  [facility] //TuThSa-2// TW 60kg  #. ESRD with acute resp failure -Patient completed dialysis yesterday.  Tolerated well.  No acute indication for dialysis today.   #. Anemia of CKD  Lab Results  Component Value Date   HGB 8.2 (L) 09/26/2019  Continue Epogen 4000 units IV with dialysis.   #. SHPTH     Component Value Date/Time   PTH 160 (H) 05/24/2019 0413   Lab Results  Component Value Date   PHOS 3.7 09/26/2019  Phosphorus at target at 3.7.  Continue to periodically monitor.       LOS: 6 Tkai Large 11/6/20209:06 [place], [place]

## 2019-09-26 NOTE — NC FL2 (Signed)
[place] LEVEL OF CARE SCREENING TOOL     IDENTIFICATION  [name] Birthdate: 1946-04-02 Sex: female Admission Date (Current Location): 09/20/2019  [place] and [place] Number:  Engineering geologist and Address:  [facility], [address] [place], [place] [address]      Provider Number: [id number]  Attending Physician Name and Address:  Edwin Dada, *  Relative Name and Phone Number:       Current Level of Care: Hospital Recommended Level of Care: [place] Prior Approval Number:    Date Approved/Denied:   PASRR Number: [id number]  Discharge Plan: SNF    Current Diagnoses: Patient Active Problem List   Diagnosis Date Noted  . Aortic valve stenosis   . Acute respiratory failure with hypoxemia ([place]) 09/20/2019  . COPD with acute bronchitis ([place]) 09/05/2019  . Congestive heart failure ([place]) 05/24/2019  . Hyperkalemia 11/29/2018  . Chronic diastolic heart failure ([place]) 11/02/2018  . HTN (hypertension) 11/02/2018  . Uncontrolled hypertension 10/21/2018  . Pressure injury of skin 03/16/2018  . CVA (cerebral vascular accident) ([place]) 01/16/2018  . Aortic atherosclerosis ([place]) 12/11/2017  . Chest pain 09/18/2017  . Hyperlipidemia 08/06/2015  . Complication from renal dialysis device 04/12/2015  . SOB (shortness of breath) 02/01/2015  . End stage renal disease on dialysis ([place]) 02/01/2015  . Type 2 diabetes mellitus with other specified complication ([place]) [id number]  . Asthma 02/01/2015  . Acute on chronic diastolic CHF (congestive heart failure) ([place]) 02/01/2015    Orientation RESPIRATION BLADDER Height & Weight     Self, Time, Situation, Place  Normal Continent Weight: 139 lb 1.8 oz (63.1 kg) Height:  5\' 2"  (157.5 cm)  BEHAVIORAL SYMPTOMS/MOOD NEUROLOGICAL BOWEL NUTRITION STATUS  (none) (none) Continent Diet(Renal diet with 1230ml fluid restriction)  AMBULATORY STATUS  COMMUNICATION OF NEEDS Skin   Extensive Assist Verbally Normal                       Personal Care Assistance Level of Assistance  Bathing, Feeding, Dressing Bathing Assistance: Limited assistance Feeding assistance: Independent Dressing Assistance: Limited assistance     Functional Limitations Info  Sight, Speech, Hearing Sight Info: Adequate Hearing Info: Adequate Speech Info: Adequate    SPECIAL CARE FACTORS FREQUENCY  PT (By licensed PT), OT (By licensed OT)     PT Frequency: 5 OT Frequency: 5            Contractures Contractures Info: Not present    Additional Factors Info  Code Status, Allergies Code Status Info: Full Code Allergies Info: Enalapril Maleate, Nitrofurantoin, Sulfamethoxazole-trimethoprim, 2,4-d Dimethylamine, Baclofen, Neosporin, Quinine, Ultram, Zocor, Bactrim, Levodopa, Macrodantin, Quinine Derivatives           Current Medications (09/26/2019):  This is the current hospital active medication list Current Facility-Administered Medications  Medication Dose Route Frequency Provider Last Rate Last Dose  . 0.9 %  sodium chloride infusion (Manually program via Guardrails IV Fluids)   Intravenous Once Tukov-Yual, Magdalene S, NP      . 0.9 %  sodium chloride infusion   Intravenous PRN Ottie Glazier, MD   Stopped at 09/22/19 1822  . acetaminophen (TYLENOL) tablet 500 mg  500 mg Oral Q6H PRN Ottie Glazier, MD   500 mg at 09/22/19 1312  . albuterol (PROVENTIL) (2.5 MG/3ML) 0.083% nebulizer solution 2.5 mg  2.5 mg Nebulization Q4H PRN Ottie Glazier, MD      . amLODipine (NORVASC) tablet 10 mg  10 mg Oral Daily Wellington Hampshire, MD   10 mg at 09/26/19 0945  . amoxicillin-clavulanate (AUGMENTIN) 875-125 MG per tablet 1 tablet  1 tablet Oral Daily Danford, Suann Larry, MD   1 tablet at 09/26/19 1249  . aspirin chewable tablet 81 mg  81 mg Oral Daily Kathlyn Sacramento A, MD   81 mg at 09/26/19 0945  . carvedilol (COREG) tablet 12.5 mg  12.5 mg Oral  BID WC Kathlyn Sacramento A, MD   12.5 mg at 09/26/19 0945  . Chlorhexidine Gluconate Cloth 2 % PADS 6 each  6 each Topical Q0600 Murlean Iba, MD   6 each at 09/26/19 0441  . clopidogrel (PLAVIX) tablet 75 mg  75 mg Oral Daily Edwin Dada, MD   75 mg at 09/26/19 0945  . epoetin alfa (EPOGEN) injection 4,000 Units  4,000 Units Intravenous Q T,Th,Sa-HD Murlean Iba, MD   4,000 Units at 09/25/19 1246  . furosemide (LASIX) tablet 80 mg  80 mg Oral Daily Edwin Dada, MD   80 mg at 09/26/19 0945  . guaiFENesin-dextromethorphan (ROBITUSSIN DM) 100-10 MG/5ML syrup 5 mL  5 mL Oral Q6H PRN Ottie Glazier, MD   5 mL at 09/22/19 1120  . heparin injection 1,200 Units  20 Units/kg Dialysis PRN Murlean Iba, MD      . hydrALAZINE (APRESOLINE) tablet 50 mg  50 mg Oral Q8H Danford, Suann Larry, MD   50 mg at 09/26/19 1248  . HYDROcodone-acetaminophen (NORCO) 7.5-325 MG per tablet 1 tablet  1 tablet Oral Q8H PRN Tyler Pita, MD   1 tablet at 09/22/19 0540  . insulin aspart (novoLOG) injection 0-5 Units  0-5 Units Subcutaneous QHS Awilda Bill, NP      . insulin aspart (novoLOG) injection 0-9 Units  0-9 Units Subcutaneous TID WC Awilda Bill, NP   1 Units at 09/24/19 1631  . losartan (COZAAR) tablet 50 mg  50 mg Oral Daily Kate Sable, MD   50 mg at 09/26/19 1249  . MEDLINE mouth rinse  15 mL Mouth Rinse BID Tyler Pita, MD   15 mL at 09/26/19 0950  . midazolam (VERSED) injection 2 mg  2 mg Intravenous Q2H PRN Tyler Pita, MD   2 mg at 09/20/19 1722  . morphine 2 MG/ML injection 1-2 mg  1-2 mg Intravenous Q2H PRN Ottie Glazier, MD   2 mg at 09/26/19 0946  . oxybutynin (DITROPAN-XL) 24 hr tablet 15 mg  15 mg Oral QHS Edwin Dada, MD   15 mg at 09/25/19 2253  . pantoprazole (PROTONIX) injection 40 mg  40 mg Intravenous Daily Ottie Glazier, MD   40 mg at 09/26/19 0945  . polyethylene glycol (MIRALAX / GLYCOLAX) packet 17 g  17 g Oral Daily  Ottie Glazier, MD   17 g at 09/26/19 0945  . pravastatin (PRAVACHOL) tablet 40 mg  40 mg Oral q1800 Danford, Suann Larry, MD         Discharge Medications: Please see discharge summary for a list of discharge medications.  Relevant Imaging Results:  Relevant Lab Results:   Additional Information SSN: [id number]  Annamaria Boots, [place]

## 2019-09-26 NOTE — Evaluation (Signed)
Occupational Therapy Evaluation Patient Details Name: Phyllis [name] MRN: [id number] DOB: [birth year] Today's Date: 09/26/2019    History of Present Illness 73yo female admitted for acute hypoxic respiratory failure due to volume overload. Intubated and mechanically ventilated in the ICU on 10/31, emergent dialysis done, extubated on 11/1.   Clinical Impression   Pt seen for OT evaluation this date. Prior to hospital admission, pt was modified independent using 2WW for mobility and indep with basic ADL and light meal prep, driving, and enjoyed creating stained glass art.  Pt lives with her partner and is eager to return home.  Currently pt demonstrates impairments in activity tolerance, strength, and balance requiring min-mod assist for bed mobility and min-mod assist for LB ADL. VSS throughout. Pt demo'd posterior lean with seated balance requiring CGA and occasional cues to correct with heavy reliance on BUE support to keep her upright. Pt reports posterior LOB at home as well. Pt at high risk of falls. Pt would benefit from skilled OT to address noted impairments and functional limitations (see below for any additional details) in order to maximize safety and independence while minimizing falls risk and caregiver burden.  Upon hospital discharge, recommend pt discharge to SNF. Will continue to progress towards goals and reassess most appropriate discharge recommendation as pt progresses.    Follow Up Recommendations  SNF    Equipment Recommendations  3 in 1 bedside commode    Recommendations for Other Services       Precautions / Restrictions Precautions Precautions: Fall Restrictions Weight Bearing Restrictions: No      Mobility Bed Mobility Overal bed mobility: Needs Assistance Bed Mobility: Supine to Sit;Sit to Supine     Supine to sit: Min assist;HOB elevated Sit to supine: Mod assist   General bed mobility comments: additional time, effort, BUE support  Transfers                       Balance Overall balance assessment: Needs assistance Sitting-balance support: Single extremity supported;Bilateral upper extremity supported;Feet unsupported Sitting balance-Leahy Scale: Fair Sitting balance - Comments: heavy UE reliance on holding bed rail or EOB to prevent posterior LOB, CGA at times but no more assist required Postural control: Posterior lean                                 ADL either performed or assessed with clinical judgement   ADL Overall ADL's : Needs assistance/impaired Eating/Feeding: Sitting;Supervision/ safety   Grooming: Sitting;Min guard Grooming Details (indicate cue type and reason): CGA for sitting balance Upper Body Bathing: Sitting;Min guard   Lower Body Bathing: Sitting/lateral leans;Moderate assistance   Upper Body Dressing : Sitting;Min guard   Lower Body Dressing: Sitting/lateral leans;Moderate assistance                       Vision Baseline Vision/History: Wears glasses Wears Glasses: At all times Patient Visual Report: No change from baseline       Perception     Praxis      Pertinent Vitals/Pain Pain Assessment: No/denies pain     Hand Dominance Right   Extremity/Trunk Assessment Upper Extremity Assessment Upper Extremity Assessment: Overall WFL for tasks assessed(shoulders at least 4/5, otherwise [facility])   Lower Extremity Assessment Lower Extremity Assessment: Generalized weakness       Communication Communication Communication: No difficulties   Cognition Arousal/Alertness: Awake/alert Behavior During Therapy: [facility] for  tasks assessed/performed Overall Cognitive Status: Within Functional Limits for tasks assessed                                     General Comments  VSS throughout, pt reporting fatigue near end of sitting approx 10 min    Exercises Other Exercises Other Exercises: sitting balance activity requiring CGA to close supervision for  balance and reaching in all planes   Shoulder Instructions      Home Living Family/patient expects to be discharged to:: Private residence Living Arrangements: Spouse/significant other Available Help at Discharge: Friend(s);Available PRN/intermittently Type of Home: House Home Access: Stairs to enter [employer] of Steps: 4 Entrance Stairs-Rails: Right Home Layout: Multi-level;Able to live on main level with bedroom/bathroom Alternate Level Stairs-Number of Steps: 1 1/2 story home but does not go to upper half   Bathroom Shower/Tub: Occupational psychologist: Standard     Home Equipment: Environmental consultant - 2 wheels;Shower seat;Other (comment)   Additional Comments: lift recliner      Prior Functioning/Environment Level of Independence: Independent with assistive device(s)        Comments: Pt mod indep with RW in the home, [facility] for longer community distances; driving; sleeps in lift recliner, no falls in past 12 months; enjoy doing stained glass art as a hobby        OT Problem List: Decreased activity tolerance;Decreased strength;Decreased knowledge of use of DME or AE;Impaired balance (sitting and/or standing)      OT Treatment/Interventions: Self-care/ADL training;Therapeutic exercise;Therapeutic activities;Patient/family education;DME and/or AE instruction;Balance training;Energy conservation    OT Goals(Current goals can be found in the care plan section) Acute Rehab OT Goals Patient Stated Goal: go home OT Goal Formulation: With patient Time For Goal Achievement: 10/10/19 Potential to Achieve Goals: Good ADL Goals Pt Will Perform Lower Body Dressing: with min guard assist;sit to/from stand Pt Will Transfer to Toilet: with min guard assist;ambulating;bedside commode(LRAD for amb)  OT Frequency: Min 1X/week   Barriers to D/C: Decreased caregiver support          Co-evaluation              AM-PAC OT "6 Clicks" Daily Activity     Outcome Measure  Help from another person eating meals?: None Help from another person taking care of personal grooming?: None Help from another person toileting, which includes using toliet, bedpan, or urinal?: A Lot Help from another person bathing (including washing, rinsing, drying)?: A Lot Help from another person to put on and taking off regular upper body clothing?: A Little Help from another person to put on and taking off regular lower body clothing?: A Lot 6 Click Score: 17   End of Session    Activity Tolerance: Patient tolerated treatment well Patient left: in bed;with call bell/phone within reach;with bed alarm set  OT Visit Diagnosis: Other abnormalities of gait and mobility (R26.89);Muscle weakness (generalized) (M62.81)                Time: [id number] OT Time Calculation (min): 26 min Charges:  OT General Charges $OT Visit: 1 Visit OT Evaluation $OT Eval Low Complexity: 1 Low OT Treatments $Therapeutic Activity: 8-22 mins  Jeni Salles, MPH, MS, OTR/L ascom [phone] 09/26/19, 10:17 AM

## 2019-09-26 NOTE — Progress Notes (Signed)
Progress Note  [name] Date of Encounter: 09/26/2019  Primary Cardiologist: Ida Rogue, MD   Subjective   Shortness of breath close to baseline Slept well, not on nasal cannula oxygen Hemodialysis for 1.5 L yesterday  Inpatient Medications    Scheduled Meds: . sodium chloride   Intravenous Once  . amLODipine  10 mg Oral Daily  . amoxicillin-clavulanate  1 tablet Oral Daily  . aspirin  81 mg Oral Daily  . carvedilol  12.5 mg Oral BID WC  . Chlorhexidine Gluconate Cloth  6 each Topical Q0600  . clopidogrel  75 mg Oral Daily  . epoetin (EPOGEN/PROCRIT) injection  4,000 Units Intravenous Q T,Th,Sa-HD  . furosemide  80 mg Oral Daily  . hydrALAZINE  50 mg Oral Q8H  . insulin aspart  0-5 Units Subcutaneous QHS  . insulin aspart  0-9 Units Subcutaneous TID WC  . losartan  50 mg Oral Daily  . mouth rinse  15 mL Mouth Rinse BID  . oxybutynin  15 mg Oral QHS  . pantoprazole (PROTONIX) IV  40 mg Intravenous Daily  . polyethylene glycol  17 g Oral Daily  . pravastatin  40 mg Oral q1800   Continuous Infusions: . sodium chloride Stopped (09/22/19 1822)   PRN Meds: sodium chloride, acetaminophen, albuterol, guaiFENesin-dextromethorphan, heparin, HYDROcodone-acetaminophen, midazolam, morphine injection   Vital Signs    Vitals:   09/26/19 0700 09/26/19 0800 09/26/19 0900 09/26/19 0946  BP: (!) 140/39 (!) 145/46 (!) 144/43 (!) 150/48  Pulse: 75 86 85 79  Resp: (!) 25 (!) 30 (!) 22 (!) 24  Temp: 99.3 F (37.4 C) 99.5 F (37.5 C) 99.9 F (37.7 C) 100 F (37.8 C)  TempSrc:      SpO2: 91% 96% 96% 97%  Weight:      Height:        Intake/Output Summary (Last 24 hours) at 09/26/2019 0958 Last data filed at 09/26/2019 0400 Gross per 24 hour  Intake 225 ml  Output 1045 ml  Net -820 ml   Last 3 Weights 09/26/2019 09/25/2019 09/25/2019  Weight (lbs) 139 lb 1.8 oz 138 lb 14.2 oz 138 lb 14.2 oz  Weight (kg) 63.1 kg 63 kg 63 kg      Telemetry     Sinus rhythm - Personally Reviewed  ECG    Not performed today-  Physical Exam   Constitutional:  oriented to person, place, and time. No distress.  HENT:  Head: Grossly normal Eyes:  no discharge. No scleral icterus.  Neck: No JVD, no carotid bruits  Cardiovascular: Regular rate and rhythm, 2/6 SEM RSB Pulmonary/Chest: Clear to auscultation bilaterally, no wheezes or rails Abdominal: Soft.  no distension.  no tenderness.  Musculoskeletal: Normal range of motion Neurological:  normal muscle tone. Coordination normal. No atrophy Skin: Skin warm and dry Psychiatric: normal affect, pleasant    Labs    High Sensitivity Troponin:   Recent Labs  Lab 09/21/19 1424 09/21/19 1620 09/22/19 1054 09/22/19 1238 09/22/19 1853  TROPONINIHS 109* 114* 211* 204* 185*      Chemistry Recent Labs  Lab 09/20/19 0526  09/22/19 0752  09/24/19 0346 09/25/19 0547 09/26/19 0335  NA 138   < > 143   < > 140 139 138  K 3.7   < > 3.1*   < > 3.5 3.7 3.0*  CL 99   < > 100   < > 97* 96* 99  CO2 25   < > 30   < >  31 28 29   GLUCOSE 190*   < > 95   < > 101* 102* 97  BUN 49*   < > 18   < > 14 29* 20  CREATININE 6.54*   < > 3.05*   < > 2.54* 4.26* 2.75*  CALCIUM 7.8*   < > 7.8*   < > 8.2* 8.2* 7.6*  PROT 6.8  --  5.7*  --   --   --   --   ALBUMIN 3.9  --  3.3*  --   --   --   --   AST 16  --  15  --   --   --   --   ALT 11  --  12  --   --   --   --   ALKPHOS 68  --  44  --   --   --   --   BILITOT 0.7  --  1.3*  --   --   --   --   GFRNONAA 6*   < > 15*   < > 18* 10* 16*  GFRAA 7*   < > 17*   < > 21* 11* 19*  ANIONGAP 14   < > 13   < > 12 15 10    < > = values in this interval not displayed.     Hematology Recent Labs  Lab 09/24/19 0346 09/25/19 0547 09/26/19 0335  WBC 6.0 5.3 4.4  RBC 2.96* 2.94* 2.85*  HGB 8.6* 8.6* 8.2*  HCT 26.4* 26.5* 25.7*  MCV 89.2 90.1 90.2  MCH 29.1 29.3 28.8  MCHC 32.6 32.5 31.9  RDW 14.4 13.8 13.5  PLT 106* 108* 119*    BNP Recent Labs  Lab  09/20/19 0526  BNP 1,516.0*     DDimer No results for input(s): DDIMER in the last 168 hours.   Radiology    Dg Chest Port 1 View  Result Date: 09/24/2019 CLINICAL DATA:  Pulmonary infiltrates. EXAM: PORTABLE CHEST 1 VIEW COMPARISON:  09/22/2019 FINDINGS: Improved appearance of interstitial and heterogeneous opacity seen on prior exams. Cardiomediastinal contours are stable with cardiac enlargement and dense aortic calcification and tortuosity. No signs of effusion or dense consolidation. No acute bone finding. IMPRESSION: 1. Improved appearance of presumed edema, potentially superimposed on chronic lung disease. 2. Stable dense aortic calcification and tortuosity. Electronically Signed   By: Zetta Bills M.D.   On: 09/24/2019 10:41    Cardiac Studies   TTE (09/21/2019): 1. Left ventricular ejection fraction, by visual estimation, is 60 to 65%. The left ventricle has normal function. There is mildly increased left ventricular hypertrophy. 2. Global right ventricle has normal systolic function.The right ventricular size is normal. No increase in right ventricular wall thickness. 3. Left atrial size was mild-moderately dilated. 4. Mild mitral valve regurgitation. 5. The tricuspid valve is normal in structure. Tricuspid valve regurgitation is not demonstrated. 6. The aortic valve was not well visualized. Measurements as detailed below, Moderate aortic valve stenosis by gradients, severe stenosis by estimated AVA. 7. TR signal is inadequate for assessing pulmonary artery systolic pressure. 8. Aortic valve mean gradient measures 20.6 mmHg. 9. Aortic valve peak gradient measures 32.0 mmHg. 10. Aortic valve area, by VTI measures 0.68 cm.  Patient Profile     73 y.o. female with history of end-stage renal disease on dialysis, aortic stenosis, hyperlipidemia who presents due to shortness of breath, found to have pulmonary edema requiring intubation.  Patient is now currently  extubated.  Assessment & Plan     1. Flash pulmonary edema multifactorial in the setting of end-stage renal disease/hemodialysis dependent, worsening anemia, moderate to severe aortic valve stenosis.  -She has had aggressive hemodialysis this hospital admission, likely close to her dry weight -We will need to continue CHF education, needs to minimize fluid intake, Could consider changing Lasix as outpatient to torsemide on nondialysis days  2.  Aortic valve stenosis Measurements appear moderate by gradient though severe by estimated aortic valve area --- very narrow therapeutic window leading to frequent recurrences of flash pulmonary edema.  Will need to be aggressive with her hemodialysis === Would suggest aggressive physical therapy, work on her conditioning in effort to see if she would be a candidate for further work-up for aortic valve stenosis including TEE,  CATH, and referral to TAVR clinic  Currently very weak, deconditioned  3.  Anemia Received Epogen with hemodialysis -Ideally goal hemoglobin of 10 Worsening anemia will contribute to flash pulmonary edema, worsening shortness of breath Currently hemoglobin of 8  4.  End-stage renal disease on hemodialysis 3 days/week Followed by nephrology, --We will need to keep near her dry weight going forward Consider torsemide on nondialysis days, We will continue discussion with her to limit fluids   Total encounter time more than 25 minutes  Greater than 50% was spent in counseling and coordination of care with the patient   Signed, Ida Rogue, MD  09/26/2019, 9:58 AM

## 2019-09-27 DIAGNOSIS — I5031 Acute diastolic (congestive) heart failure: Secondary | ICD-10-CM

## 2019-09-27 LAB — GLUCOSE, CAPILLARY
Glucose-Capillary: 108 mg/dL — ABNORMAL HIGH (ref 70–99)
Glucose-Capillary: 106 mg/dL — ABNORMAL HIGH (ref 70–99)
Glucose-Capillary: 133 mg/dL — ABNORMAL HIGH (ref 70–99)

## 2019-09-27 LAB — CREATININE, SERUM
Creatinine, Ser: 4.37 mg/dL — ABNORMAL HIGH (ref 0.44–1.00)
GFR calc Af Amer: 11 mL/min — ABNORMAL LOW (ref >60)
GFR calc non Af Amer: 9 mL/min — ABNORMAL LOW (ref >60)

## 2019-09-27 LAB — PHOSPHORUS: Phosphorus: 1.2 mg/dL — ABNORMAL LOW (ref 2.5–4.6)

## 2019-09-27 MED ORDER — POTASSIUM CHLORIDE CRYS ER 20 MEQ PO TBCR
20.0000 meq | EXTENDED_RELEASE_TABLET | Freq: Once | ORAL | Status: AC
  Administered 2019-09-27: 20 meq via ORAL
  Filled 2019-09-27: qty 1

## 2019-09-27 NOTE — Progress Notes (Signed)
Pharmacy Electrolyte Monitoring Consult:  Betzabel Vink is a 72 YOF admitted on 09/20/2019 with respiratory distress. She is a lifelong never smoker with ESRD on HD (Tues, Thurs, Sat). Patient presented with acute hypoxic respiratory failure due to volume overload.  She was admitted to the ICU, intubated, and mechanically ventilated. Patient subsequently extubated and is currently stepdown status.   Patient received dialysis on 11/5. Patient to receive torsemide 40mg  daily on non-dialysis days. Plan for HD 11/7.   Labs:  Sodium (mmol/L)  Date Value  09/26/2019 138  12/10/2014 142   Potassium (mmol/L)  Date Value  09/26/2019 3.0 (L)  12/10/2014 3.6   Magnesium (mg/dL)  Date Value  09/26/2019 1.6 (L)   Phosphorus (mg/dL)  Date Value  09/26/2019 3.7  12/09/2014 4.4   Calcium (mg/dL)  Date Value  09/26/2019 7.6 (L)   Calcium, Total (mg/dL)  Date Value  12/10/2014 8.6   Albumin (g/dL)  Date Value  09/22/2019 3.3 (L)  12/07/2014 2.5 (L)   Corrected Calcium: 8.2 mg/dL  Electrolytes:  Assessment/Plan: - K+ 3.0 on 11/6. Pt only received KCl 20 mEq x 1. Will order KCl 20 mEq x 1 as pt will receive HD today.  - Pt is on losartan 50 mg daily.  - Will check BMP and Mg with AM labs and supplement accordingly.  Goals: -Maintain electrolytes within normal limits   Glucose:  - Continue SSI as ordered. BG are within goal range.   Pharmacy will continue to monitor and adjust per consult.   Oswald Hillock, PharmD, BCPS 09/27/2019 9:22 AM

## 2019-09-27 NOTE — Progress Notes (Signed)
Pre HD Tx Assessment   09/27/19 1545  Neurological  Level of Consciousness Alert  Orientation Level Oriented X4  Respiratory  Respiratory Pattern Regular;Unlabored  Chest Assessment Chest expansion symmetrical  Bilateral Breath Sounds Clear;Diminished  R Upper  Breath Sounds Clear  L Upper Breath Sounds Clear  R Lower Breath Sounds Clear;Diminished  L Lower Breath Sounds Clear;Diminished  Cough None  Cardiac  Pulse Irregular  Heart Sounds Murmur  Jugular Venous Distention (JVD) No  ECG Monitor No  Antiarrhythmic device No  Vascular  R Radial Pulse +2  L Radial Pulse +2  R Dorsalis Pedis Pulse +2  L Dorsalis Pedis Pulse +1  RLE Edema Non-pitting  LLE Edema Non-Pitting  Integumentary  Integumentary (WDL) X  Skin Color Appropriate for ethnicity  Skin Condition Dry  Skin Integrity Ecchymosis;Excoriated (scratch marks)  Ecchymosis Location Arm  Ecchymosis Location Orientation Bilateral  Musculoskeletal  Musculoskeletal (WDL) X  Generalized Weakness Yes  GU Assessment  Genitourinary (WDL) X  Genitourinary Symptoms Oliguria  Psychosocial  Psychosocial (WDL) WDL

## 2019-09-27 NOTE — Progress Notes (Signed)
Post HD Tx Note  Pt tolerated   09/27/19 1945  Hand-Off documentation  Report given to (Full Name) Jaymes Graff RN  Report received from (Full Name) Newt Minion RN  Vital Signs  Temp 98.8 F (37.1 C)  Temp Source Oral  Pulse Rate 78  Pulse Rate Source Monitor  Resp 18  BP (!) 150/45  BP Location Left Arm  BP Method Automatic  Patient Position (if appropriate) Lying  Oxygen Therapy  SpO2 100 %  O2 Device Nasal Cannula  O2 Flow Rate (L/min) 3 L/min  Pain Assessment  Pain Scale 0-10  Pain Score 0  Post-Hemodialysis Assessment  Rinseback Volume (mL) 250 mL  KECN 78.7 V  Dialyzer Clearance Lightly streaked  Duration of HD Treatment -hour(s) 3.5 hour(s)  Hemodialysis Intake (mL) 500 mL  UF Total -Machine (mL) 2000 mL  Net UF (mL) 1500 mL  Tolerated HD Treatment Yes  AVG/AVF Arterial Site Held (minutes) 8 minutes  AVG/AVF Venous Site Held (minutes) 9 minutes  Fistula / Graft Left Forearm Arteriovenous fistula  No Placement Date or Time found.   Placed prior to admission: Yes  Orientation: Left  Access Location: Forearm  Access Type: Arteriovenous fistula  Site Condition No complications  Fistula / Graft Assessment Present;Thrill;Bruit  Status Deaccessed  Drainage Description None   well the Tx. Pt tx run for 3.5 hrs on 4K2.5Ca asper nephrologist verbal order for low Potassium as of this morning. Pt requested 3LO2 for comfort during Tx. AVF patent WDL.

## 2019-09-27 NOTE — Progress Notes (Signed)
[facility], [place] 09/27/19  Subjective:  Patient due for hemodialysis today. Moved to floor care yesterday. Overall doing much better as compared to admission.  Objective:  Vital signs in last 24 hours:  Temp:  [98.2 F (36.8 C)-100 F (37.8 C)] 98.2 F (36.8 C) (11/06 2109) Pulse Rate:  [54-86] 68 (11/06 2109) Resp:  [16-30] 19 (11/06 2109) BP: (129-165)/(43-89) 140/44 (11/06 2109) SpO2:  [94 %-100 %] 100 % (11/06 2109) Weight:  [62.1 kg] 62.1 kg (11/07 0500)  Weight change: -0.857 kg Filed Weights   09/25/19 1045 09/26/19 0500 09/27/19 0500  Weight: 63 kg 63.1 kg 62.1 kg    Intake/Output:    Intake/Output Summary (Last 24 hours) at 09/27/2019 0738 Last data filed at 09/27/2019 0526 Gross per 24 hour  Intake 290 ml  Output 210 ml  Net 80 ml     Physical Exam: General: No acute distress  HEENT normocephalic, atraumatic, hearing intact  Pulm/lungs clear bilateral, normal effort  CVS/Heart Regular, no rubs  Abdomen:  Soft, non distended  Extremities: No edema  Neurologic: Alert, able to follow simple commands  Skin: No acute rashes, warm dry  Access: Left forearm AVF       Basic Metabolic Panel:  Recent Labs  Lab 09/21/19 0441 09/22/19 0752 09/23/19 0346 09/24/19 0346 09/25/19 0547 09/25/19 1337 09/26/19 0335 09/27/19 0443  NA  --  143 143 140 139  --  138  --   K  --  3.1* 3.7 3.5 3.7  --  3.0*  --   CL  --  100 99 97* 96*  --  99  --   CO2  --  30 28 31 28   --  29  --   GLUCOSE  --  95 122* 101* 102*  --  97  --   BUN  --  18 33* 14 29*  --  20  --   CREATININE  --  3.05* 4.57* 2.54* 4.26*  --  2.75* 4.37*  CALCIUM  --  7.8* 7.6* 8.2* 8.2*  --  7.6*  --   MG 2.4 2.0 1.9  --  1.9  --  1.6*  --   PHOS  --  2.5  --   --   --  1.4* 3.7  --      CBC: Recent Labs  Lab 09/22/19 1645 09/23/19 0346 09/24/19 0346 09/25/19 0547 09/26/19 0335  WBC 9.1 7.2 6.0 5.3 4.4  NEUTROABS  --   --   --  4.2 3.1  HGB 8.5* 7.6*  8.6* 8.6* 8.2*  HCT 26.0* 23.6* 26.4* 26.5* 25.7*  MCV 89.7 89.4 89.2 90.1 90.2  PLT 108* 105* 106* 108* 119*      Lab Results  Component Value Date   HEPBSAG NON REACTIVE 09/06/2019   HEPBSAB Reactive (A) 09/25/2019      Microbiology:  Recent Results (from the past 240 hour(s))  Culture, blood (routine x 2)     Status: None   Collection Time: 09/20/19  5:26 AM   Specimen: BLOOD  Result Value Ref Range Status   Specimen Description BLOOD RIGHT FOREARM  Final   Special Requests   Final    BOTTLES DRAWN AEROBIC AND ANAEROBIC Blood Culture adequate volume   Culture   Final    NO GROWTH 5 DAYS Performed at [facility], [address], [place], [place] [address]    Report Status 09/25/2019 FINAL  Final  Culture, blood (routine x 2)  Status: None   Collection Time: 09/20/19  5:26 AM   Specimen: BLOOD  Result Value Ref Range Status   Specimen Description BLOOD RIGHT HAND  Final   Special Requests   Final    BOTTLES DRAWN AEROBIC AND ANAEROBIC Blood Culture adequate volume   Culture   Final    NO GROWTH 5 DAYS Performed at [facility], [address] [place], [place] [address]    Report Status 09/25/2019 FINAL  Final  SARS Coronavirus 2 by RT PCR (hospital order, performed in [place] hospital lab) Nasopharyngeal Nasopharyngeal Swab     Status: None   Collection Time: 09/20/19  5:26 AM   Specimen: Nasopharyngeal Swab  Result Value Ref Range Status   SARS Coronavirus 2 NEGATIVE NEGATIVE Final    Comment: (NOTE) If result is NEGATIVE SARS-CoV-2 target nucleic acids are NOT DETECTED. The SARS-CoV-2 RNA is generally detectable in upper and lower  respiratory specimens during the acute phase of infection. The lowest  concentration of SARS-CoV-2 viral copies this assay can detect is 250  copies / mL. A negative result does not preclude SARS-CoV-2 infection  and should not be used as the sole basis for treatment or other  patient management  decisions.  A negative result may occur with  improper specimen collection / handling, submission of specimen other  than nasopharyngeal swab, presence of viral mutation(s) within the  areas targeted by this assay, and inadequate number of viral copies  (<250 copies / mL). A negative result must be combined with clinical  observations, patient history, and epidemiological information. If result is POSITIVE SARS-CoV-2 target nucleic acids are DETECTED. The SARS-CoV-2 RNA is generally detectable in upper and lower  respiratory specimens dur ing the acute phase of infection.  Positive  results are indicative of active infection with SARS-CoV-2.  Clinical  correlation with patient history and other diagnostic information is  necessary to determine patient infection status.  Positive results do  not rule out bacterial infection or co-infection with other viruses. If result is PRESUMPTIVE POSTIVE SARS-CoV-2 nucleic acids MAY BE PRESENT.   A presumptive positive result was obtained on the submitted specimen  and confirmed on repeat testing.  While 2019 novel coronavirus  (SARS-CoV-2) nucleic acids may be present in the submitted sample  additional confirmatory testing may be necessary for epidemiological  and / or clinical management purposes  to differentiate between  SARS-CoV-2 and other Sarbecovirus currently known to infect humans.  If clinically indicated additional testing with an alternate test  methodology [phone]) is advised. The SARS-CoV-2 RNA is generally  detectable in upper and lower respiratory sp ecimens during the acute  phase of infection. The expected result is Negative. Fact Sheet for Patients:  StrictlyIdeas.no Fact Sheet for Healthcare Providers: BankingDealers.co.za This test is not yet approved or cleared by the [place] FDA and has been authorized for detection and/or diagnosis of SARS-CoV-2 by FDA under an  Emergency Use Authorization (EUA).  This EUA will remain in effect (meaning this test can be used) for the duration of the COVID-19 declaration under Section 564(b)(1) of the Act, 21 U.S.C. section 360bbb-3(b)(1), unless the authorization is terminated or revoked sooner. Performed at [facility] [address], [place] [place], [place] [address]   MRSA PCR Screening     Status: None   Collection Time: 09/20/19 10:12 AM   Specimen: Nasopharyngeal  Result Value Ref Range Status   MRSA by PCR NEGATIVE NEGATIVE Final    Comment:  The GeneXpert MRSA Assay (FDA approved for NASAL specimens only), is one component of a comprehensive MRSA colonization surveillance program. It is not intended to diagnose MRSA infection nor to guide or monitor treatment for MRSA infections. Performed at [facility], [address] [place], [place] [address]   CULTURE, BLOOD (ROUTINE X 2) w Reflex to ID Panel     Status: None (Preliminary result)   Collection Time: 09/22/19  6:30 PM   Specimen: BLOOD  Result Value Ref Range Status   Specimen Description BLOOD BLOOD LEFT HAND  Final   Special Requests   Final    BOTTLES DRAWN AEROBIC ONLY Blood Culture results may not be optimal due to an inadequate volume of blood received in culture bottles   Culture   Final    NO GROWTH 3 DAYS Performed at [facility], [address], [place], [place] [address]    Report Status PENDING  Incomplete  CULTURE, BLOOD (ROUTINE X 2) w Reflex to ID Panel     Status: None (Preliminary result)   Collection Time: 09/22/19  6:53 PM   Specimen: BLOOD  Result Value Ref Range Status   Specimen Description BLOOD BLOOD RIGHT HAND  Final   Special Requests   Final    BOTTLES DRAWN AEROBIC AND ANAEROBIC Blood Culture adequate volume   Culture   Final    NO GROWTH 3 DAYS Performed at [facility], [address] [place], [place] [address]    Report Status PENDING  Incomplete     Coagulation Studies: No results for input(s): LABPROT, INR in the last 72 hours.  Urinalysis: No results for input(s): COLORURINE, LABSPEC, PHURINE, GLUCOSEU, HGBUR, BILIRUBINUR, KETONESUR, PROTEINUR, UROBILINOGEN, NITRITE, LEUKOCYTESUR in the last 72 hours.  Invalid input(s): APPERANCEUR    Imaging: No results found.   Medications:   . sodium chloride Stopped (09/22/19 1822)   . sodium chloride   Intravenous Once  . amLODipine  10 mg Oral Daily  . amoxicillin-clavulanate  1 tablet Oral Daily  . aspirin  81 mg Oral Daily  . carvedilol  12.5 mg Oral BID WC  . Chlorhexidine Gluconate Cloth  6 each Topical Q0600  . clopidogrel  75 mg Oral Daily  . epoetin (EPOGEN/PROCRIT) injection  4,000 Units Intravenous Q T,Th,Sa-HD  . hydrALAZINE  50 mg Oral Q8H  . insulin aspart  0-5 Units Subcutaneous QHS  . insulin aspart  0-9 Units Subcutaneous TID WC  . losartan  50 mg Oral Daily  . mouth rinse  15 mL Mouth Rinse BID  . oxybutynin  15 mg Oral QHS  . pantoprazole (PROTONIX) IV  40 mg Intravenous Daily  . polyethylene glycol  17 g Oral Daily  . pravastatin  40 mg Oral q1800  . [START ON 09/28/2019] torsemide  40 mg Oral Once per day on Sun Mon Wed Fri     Assessment/ Plan:  73 y.o. female with end stage renal disease on hemodialysis, hypertension, diabetes mellitus type II, overactive bladder, gout, COPD, congestive heart failure, coronary artery disease, aortic atherosclerosis  Active Problems:   Congestive heart failure (HCC)   Acute respiratory failure with hypoxemia ([place])   Aortic valve stenosis  [facility]// [facility] //TuThSa-2// TW 60kg  #. ESRD with acute resp failure -Patient due for dialysis today.  We have prepared orders.  #. Anemia of CKD  Lab Results  Component Value Date   HGB 8.2 (L) 09/26/2019  Hemoglobin remains a bit low at 8.2.  Maintain  the patient on Epogen 4000 units IV with dialysis.   #. SHPTH     Component Value Date/Time    PTH 160 (H) 05/24/2019 0413   Lab Results  Component Value Date   PHOS 3.7 09/26/2019  Phosphorus remains at target at 3.7.  Repeat bone mineral metabolism parameters today.       LOS: 7 Mcdaniel Ohms 11/7/20207:38 AM  [place], [place]

## 2019-09-27 NOTE — Progress Notes (Signed)
Post HD Tx Assessment   09/27/19 1930  Neurological  Level of Consciousness Alert  Orientation Level Oriented X4  Respiratory  Respiratory Pattern Regular;Unlabored  Chest Assessment Chest expansion symmetrical  Bilateral Breath Sounds Clear;Diminished  R Upper  Breath Sounds Clear  L Upper Breath Sounds Clear  R Lower Breath Sounds Clear;Diminished  L Lower Breath Sounds Clear;Diminished  Cough None  Cardiac  Pulse Irregular  Heart Sounds Murmur  Jugular Venous Distention (JVD) No  ECG Monitor No  Antiarrhythmic device No  Vascular  R Radial Pulse +2  L Radial Pulse +2  R Dorsalis Pedis Pulse +2  L Dorsalis Pedis Pulse +1  RLE Edema Non-pitting  LLE Edema Non-Pitting  Integumentary  Integumentary (WDL) X  Skin Color Appropriate for ethnicity  Skin Condition Dry  Skin Integrity Ecchymosis;Excoriated (scratch marks)  Ecchymosis Location Arm  Ecchymosis Location Orientation Bilateral  Musculoskeletal  Musculoskeletal (WDL) X  Generalized Weakness Yes  GU Assessment  Genitourinary (WDL) X  Genitourinary Symptoms Oliguria  Psychosocial  Psychosocial (WDL) WDL

## 2019-09-27 NOTE — Progress Notes (Signed)
POCT glucose stable. HD scheduled for today with delayed treatment schedule. Likes ice pack to chest for "costochronditis" filled for pt. Eating well. Fluid restriction 1200 ml reinforced. Foley discontinued with pt educated on I&O.

## 2019-09-27 NOTE — Progress Notes (Signed)
Pre HD Tx Note Pt is scheduled for routine HD Tx. Pt is A and O*4 on room aire but had requested 3L of O2 for comfort during Tx.  Pt Potassium is 3.0 as of this morning. Nephrologist is notified and requested to change the Dialysate bath to 4K 2.5Ca instead of 3K2.5Ca. Pt Tx will rum for3.5. AVF WDL Will keep monitoring   09/27/19 1545  Hand-Off documentation  Report given to (Full Name) Newt Minion RN   Report received from (Full Name) Westley Gambles RN   Vital Signs  Temp 98.3 F (36.8 C)  Temp Source Oral  Pulse Rate 68  Pulse Rate Source Monitor  Resp 20  BP (!) 146/57  BP Location Left Arm  BP Method Automatic  Patient Position (if appropriate) Lying  Oxygen Therapy  SpO2 100 %  O2 Device Nasal Cannula  Heater temperature 37.4 F (3 C)  O2 Flow Rate (L/min) 3 L/min  Pain Assessment  Pain Scale 0-10  Pain Score 0  Time-Out for Hemodialysis  What Procedure? HD   Pt Identifiers(min of two) MRN/Account#;First/Last Name  Correct Site? Yes  Correct Side? Yes  Correct Procedure? Yes  Consents Verified? Yes  Safety Precautions Reviewed? Yes  Engineer, civil (consulting) Number 7  Station Number 3  UF/Alarm Test Passed  Conductivity: Meter 14  Conductivity: Machine  14  pH 7.6  Reverse Osmosis Main  Normal Saline Lot Number [id number]  Dialyzer Lot Number [id number]  Disposable Set Lot Number [id number]  Machine Temperature 98.6 F (37 C)  Musician and Audible Yes  Blood Lines Intact and Secured Yes  Pre Treatment Patient Checks  Vascular access used during treatment Fistula  HD catheter dressing before treatment WDL  Patient is receiving dialysis in a chair  (In Bed)  Hepatitis B Surface Antigen Results Negative  Date Hepatitis B Surface Antigen Drawn 09/06/19  Hepatitis B Surface Antibody  (>10)  Date Hepatitis B Surface Antibody Drawn 11/29/18  Hemodialysis Consent Verified Yes  Hemodialysis Standing Orders Initiated Yes  ECG (Telemetry) Monitor On Yes  Prime  Ordered Normal Saline  Length of  DialysisTreatment -hour(s) 3.5 Hour(s)  Dialysis Treatment Comments  (Na140)  Dialyzer Elisio 17H NR  Dialysate 4K;2.5 Ca  Dialysis Anticoagulant None  Dialysate Flow Ordered 800  Blood Flow Rate Ordered 400 mL/min  Ultrafiltration Goal 1.5 Liters  Dialysis Blood Pressure Support Ordered Albumin  Education / Care Plan  Dialysis Education Provided Yes  Documented Education in Care Plan Yes  Fistula / Graft Left Forearm Arteriovenous fistula  No Placement Date or Time found.   Placed prior to admission: Yes  Orientation: Left  Access Location: Forearm  Access Type: Arteriovenous fistula  Site Condition No complications  Fistula / Graft Assessment Present;Thrill;Bruit  Status Accessed  Needle Size 15  Drainage Description None

## 2019-09-27 NOTE — Progress Notes (Signed)
PROGRESS NOTE    Phyllis [name]  [id number] DOB: 1945/12/05 DOA: 09/20/2019 PCP: [name], MD      Brief Narrative:  Phyllis [name] is a 73 y.o. F with ESRD on HD TThS, dCHF, AoCKD, HTN, PVD carotids, DM and moderate AS and asthma who presented with respiratory distress, found to have SpO2 60% by EMS.   In ER, arrived on CPAP, required intubation.  CXR showed edema.          Assessment & Plan:   Acute hypoxic respiratory failure from flash pulmonary edema Acute on chronic diastolic CHF in setting of severe AS Patient admitted with flash pulmonary edema requiring intubation.  Dialyzed and fluid status corrected, able to be extubated.  Given her advanced aortic stenosis and concomitant dialysis-dependence, she has a tenuous and challenging fluid balance.  It seems likely that she may have frequent episodes like this if her valve cannot be repaired, and her prognosis is guarded. -Fluid management with HD, appreciate Nephrology assistance -Consult Cardiology, appreciate assistance -Lasix stopped, started torsemide on off HD days -Will need extensive rehab before candidate for TAVR  Possible pneumonia Started on empiric antibiotics at admission.  These were stopped after 1 dose, then developed fever and procal >3 and antibiotics were restarted.  Afebrile.   Has finished Zithromax 5 days -Continue Augmentin, day 6 of 7  ESRD -Consult nephrology for maintenance HD, appreciate intentions  Hypertension Peripheral vascular disease secondary prevention BP improved -Continue amlodipine, carvedilol, losartan, hydralazine -Continue ASA, Plavix   Diabetes Glucoses good -Continue SS correction insulin   Anemia of CKD -Continue EPO per Nephrology  OSA not on CPAP at home -CPAP while here  Thrombocytoepnia Stable  Other medicaitons -Continue oxybutynin         MDM and disposition: The below labs and imaging reports reviewed and summarized  above.  Medication management as above.   The patient was admitted with acute hypoxic respiratory failure from pneumonia fluid overload.    Her fluid overload and pneumonia are resolved.  However she remains significantly debilitated from baseline, and requires substantial rehabilitation return to her prior level of function, and in fact improved to the point that she can get an aortic valve replacement if possible.  At the moment we have no safe alternative discharge plans other than SNF, so we will continue to work towards placement.      DVT prophylaxis: SCDs Code Status: Full code Family Communication:     Consultants:   Critical care  Nephrology  Procedures:   10/31 HD  11/1 HD  11/1 Echocardiogram IMPRESSIONS    1. Left ventricular ejection fraction, by visual estimation, is 60 to 65%. The left ventricle has normal function. There is mildly increased left ventricular hypertrophy.  2. Global right ventricle has normal systolic function.The right ventricular size is normal. No increase in right ventricular wall thickness.  3. Left atrial size was mild-moderately dilated.  4. Mild mitral valve regurgitation.  5. The tricuspid valve is normal in structure. Tricuspid valve regurgitation is not demonstrated.  6. The aortic valve was not well visualized. Measurements as detailed below, Moderate aortic valve stenosis by gradients, severe stenosis by estimated AVA.  7. TR signal is inadequate for assessing pulmonary artery systolic pressure.  8. Aortic valve mean gradient measures 20.6 mmHg.  9. Aortic valve peak gradient measures 32.0 mmHg. 10. Aortic valve area, by VTI measures 0.68 cm.   11/3 HD  11/4 HD      Antimicrobials:   Ceftriaxone 10/31 >>  11.5   -- > Augmentin 11/6>11/7  Azithromycin 10/31 >> 11/4    Subjective: No swelling, dyspnea, palpitations, fever, cough.  She remains very weak.  She has some chest wall pain which is better with icing.       Objective: Vitals:   09/26/19 1342 09/26/19 1552 09/26/19 2109 09/27/19 0500  BP:  129/89 (!) 140/44   Pulse:  72 68   Resp:  16 19   Temp:  98.6 F (37 C) 98.2 F (36.8 C)   TempSrc:  Oral Oral   SpO2: 94% 98% 100%   Weight:    62.1 kg  Height:        Intake/Output Summary (Last 24 hours) at 09/27/2019 [id number] Last data filed at 09/27/2019 0526 Gross per 24 hour  Intake 290 ml  Output 210 ml  Net 80 ml   Filed Weights   09/25/19 1045 09/26/19 0500 09/27/19 0500  Weight: 63 kg 63.1 kg 62.1 kg    Examination: General appearance: Small elderly adult female, alert and in no acute distress.  Playing and asked on her iPad in bed. HEENT: Anicteric, conjunctiva pink, lids and lashes normal. No nasal deformity, discharge, epistaxis.  Lips moist, teeth normal. OP moist, no oral lesions.  Hearing normal. Skin: Warm and dry.  No suspicious rashes or lesions. Cardiac: RRR, actually do not appreciate a systolic murmur.  JVP not visible, no LE edema.    Respiratory: Normal respiratory rate and rhythm.  CTAB without rales or wheezes. Abdomen: Abdomen soft.  No TTP or guarding. No ascites, distension, hepatosplenomegaly.   MSK: No deformities or effusions of the large joints of the upper or lower extremities bilaterally.  Her legs seem somewhat contractured Neuro: Awake and alert. Naming is grossly intact, and the patient's recall, recent and remote, as well as general fund of knowledge seem within normal limits.  Muscle tone normal, without fasciculations.  Moves all extremities equally with extreme generalized weakness and with deficient coordination.  Speech fluent.    Psych: Sensorium intact and responding to questions, attention normal. Affect normal.  Judgment and insight appear normal.      Data Reviewed: I have personally reviewed following labs and imaging studies:  CBC: Recent Labs  Lab 09/22/19 1645 09/23/19 0346 09/24/19 0346 09/25/19 0547 09/26/19 0335  WBC 9.1 7.2  6.0 5.3 4.4  NEUTROABS  --   --   --  4.2 3.1  HGB 8.5* 7.6* 8.6* 8.6* 8.2*  HCT 26.0* 23.6* 26.4* 26.5* 25.7*  MCV 89.7 89.4 89.2 90.1 90.2  PLT 108* 105* 106* 108* [id number]*   Basic Metabolic Panel: Recent Labs  Lab 09/21/19 0441 09/22/19 0752 09/23/19 0346 09/24/19 0346 09/25/19 0547 09/25/19 1337 09/26/19 0335 09/27/19 0443  NA  --  143 143 140 139  --  138  --   K  --  3.1* 3.7 3.5 3.7  --  3.0*  --   CL  --  100 99 97* 96*  --  99  --   CO2  --  30 28 31 28   --  29  --   GLUCOSE  --  95 122* 101* 102*  --  97  --   BUN  --  18 33* 14 29*  --  20  --   CREATININE  --  3.05* 4.57* 2.54* 4.26*  --  2.75* 4.37*  CALCIUM  --  7.8* 7.6* 8.2* 8.2*  --  7.6*  --   MG 2.4 2.0 1.9  --  1.9  --  1.6*  --   PHOS  --  2.5  --   --   --  1.4* 3.7  --    GFR: Estimated Creatinine Clearance: 9.9 mL/min (A) (by C-G formula based on SCr of 4.37 mg/dL (H)). Liver Function Tests: Recent Labs  Lab 09/22/19 0752  AST 15  ALT 12  ALKPHOS 44  BILITOT 1.3*  PROT 5.7*  ALBUMIN 3.3*   Recent Labs  Lab 09/22/19 1353  LIPASE 17   No results for input(s): AMMONIA in the last 168 hours. Coagulation Profile: Recent Labs  Lab 09/21/19 2238 09/22/19 1645  INR 1.2 1.1   Cardiac Enzymes: No results for input(s): CKTOTAL, CKMB, CKMBINDEX, TROPONINI in the last 168 hours. BNP (last 3 results) No results for input(s): PROBNP in the last 8760 hours. HbA1C: No results for input(s): HGBA1C in the last 72 hours. CBG: Recent Labs  Lab 09/25/19 2246 09/26/19 0748 09/26/19 1201 09/26/19 1633 09/26/19 2110  GLUCAP 121* 99 100* 114* 121*   Lipid Profile: No results for input(s): CHOL, HDL, LDLCALC, TRIG, CHOLHDL, LDLDIRECT in the last 72 hours. Thyroid Function Tests: No results for input(s): TSH, T4TOTAL, FREET4, T3FREE, THYROIDAB in the last 72 hours. Anemia Panel: No results for input(s): VITAMINB12, FOLATE, FERRITIN, TIBC, IRON, RETICCTPCT in the last 72 hours. Urine analysis:     Component Value Date/Time   COLORURINE YELLOW (A) 11/28/2018 2146   APPEARANCEUR CLEAR (A) 11/28/2018 2146   LABSPEC 1.012 11/28/2018 2146   PHURINE 8.0 11/28/2018 2146   GLUCOSEU 50 (A) 11/28/2018 2146   HGBUR NEGATIVE 11/28/2018 2146   BILIRUBINUR NEGATIVE 11/28/2018 2146   KETONESUR 5 (A) 11/28/2018 2146   PROTEINUR >=300 (A) 11/28/2018 2146   NITRITE NEGATIVE 11/28/2018 2146   LEUKOCYTESUR NEGATIVE 11/28/2018 2146   Sepsis Labs: @LABRCNTIP (procalcitonin:4,lacticacidven:4)  ) Recent Results (from the past 240 hour(s))  Culture, blood (routine x 2)     Status: None   Collection Time: 09/20/19  5:26 AM   Specimen: BLOOD  Result Value Ref Range Status   Specimen Description BLOOD RIGHT FOREARM  Final   Special Requests   Final    BOTTLES DRAWN AEROBIC AND ANAEROBIC Blood Culture adequate volume   Culture   Final    NO GROWTH 5 DAYS Performed at [facility], [address], [place], [place] [address]    Report Status 09/25/2019 FINAL  Final  Culture, blood (routine x 2)     Status: None   Collection Time: 09/20/19  5:26 AM   Specimen: BLOOD  Result Value Ref Range Status   Specimen Description BLOOD RIGHT HAND  Final   Special Requests   Final    BOTTLES DRAWN AEROBIC AND ANAEROBIC Blood Culture adequate volume   Culture   Final    NO GROWTH 5 DAYS Performed at [facility], [address] [place], [place] [address]    Report Status 09/25/2019 FINAL  Final  SARS Coronavirus 2 by RT PCR (hospital order, performed in [facility] hospital lab) Nasopharyngeal Nasopharyngeal Swab     Status: None   Collection Time: 09/20/19  5:26 AM   Specimen: Nasopharyngeal Swab  Result Value Ref Range Status   SARS Coronavirus 2 NEGATIVE NEGATIVE Final    Comment: (NOTE) If result is NEGATIVE SARS-CoV-2 target nucleic acids are NOT DETECTED. The SARS-CoV-2 RNA is generally detectable in upper and lower  respiratory specimens during the acute phase of infection.  The lowest  concentration of SARS-CoV-2 viral  copies this assay can detect is 250  copies / mL. A negative result does not preclude SARS-CoV-2 infection  and should not be used as the sole basis for treatment or other  patient management decisions.  A negative result may occur with  improper specimen collection / handling, submission of specimen other  than nasopharyngeal swab, presence of viral mutation(s) within the  areas targeted by this assay, and inadequate number of viral copies  (<250 copies / mL). A negative result must be combined with clinical  observations, patient history, and epidemiological information. If result is POSITIVE SARS-CoV-2 target nucleic acids are DETECTED. The SARS-CoV-2 RNA is generally detectable in upper and lower  respiratory specimens dur ing the acute phase of infection.  Positive  results are indicative of active infection with SARS-CoV-2.  Clinical  correlation with patient history and other diagnostic information is  necessary to determine patient infection status.  Positive results do  not rule out bacterial infection or co-infection with other viruses. If result is PRESUMPTIVE POSTIVE SARS-CoV-2 nucleic acids MAY BE PRESENT.   A presumptive positive result was obtained on the submitted specimen  and confirmed on repeat testing.  While 2019 novel coronavirus  (SARS-CoV-2) nucleic acids may be present in the submitted sample  additional confirmatory testing may be necessary for epidemiological  and / or clinical management purposes  to differentiate between  SARS-CoV-2 and other Sarbecovirus currently known to infect humans.  If clinically indicated additional testing with an alternate test  methodology [phone]) is advised. The SARS-CoV-2 RNA is generally  detectable in upper and lower respiratory sp ecimens during the acute  phase of infection. The expected result is Negative. Fact Sheet for Patients:  StrictlyIdeas.no  Fact Sheet for Healthcare Providers: BankingDealers.co.za This test is not yet approved or cleared by the [place] FDA and has been authorized for detection and/or diagnosis of SARS-CoV-2 by FDA under an Emergency Use Authorization (EUA).  This EUA will remain in effect (meaning this test can be used) for the duration of the COVID-19 declaration under Section 564(b)(1) of the Act, 21 U.S.C. section 360bbb-3(b)(1), unless the authorization is terminated or revoked sooner. Performed at [facility], [address] [place], [place] [address]   MRSA PCR Screening     Status: None   Collection Time: 09/20/19 10:12 AM   Specimen: Nasopharyngeal  Result Value Ref Range Status   MRSA by PCR NEGATIVE NEGATIVE Final    Comment:        The GeneXpert MRSA Assay (FDA approved for NASAL specimens only), is one component of a comprehensive MRSA colonization surveillance program. It is not intended to diagnose MRSA infection nor to guide or monitor treatment for MRSA infections. Performed at [facility], [address] [place], [place] [address]   CULTURE, BLOOD (ROUTINE X 2) w Reflex to ID Panel     Status: None (Preliminary result)   Collection Time: 09/22/19  6:30 PM   Specimen: BLOOD  Result Value Ref Range Status   Specimen Description BLOOD BLOOD LEFT HAND  Final   Special Requests   Final    BOTTLES DRAWN AEROBIC ONLY Blood Culture results may not be optimal due to an inadequate volume of blood received in culture bottles   Culture   Final    NO GROWTH 3 DAYS Performed at [facility], [address] [place], [place] [address]    Report Status PENDING  Incomplete  CULTURE, BLOOD (ROUTINE X 2) w Reflex to ID  Panel     Status: None (Preliminary result)   Collection Time: 09/22/19  6:53 PM   Specimen: BLOOD  Result Value Ref Range Status   Specimen Description BLOOD BLOOD RIGHT HAND  Final   Special Requests   Final     BOTTLES DRAWN AEROBIC AND ANAEROBIC Blood Culture adequate volume   Culture   Final    NO GROWTH 3 DAYS Performed at [facility], [address], [place], [place] [address]    Report Status PENDING  Incomplete         Radiology Studies: No results found.      Scheduled Meds: . sodium chloride   Intravenous Once  . amLODipine  10 mg Oral Daily  . amoxicillin-clavulanate  1 tablet Oral Daily  . aspirin  81 mg Oral Daily  . carvedilol  12.5 mg Oral BID WC  . Chlorhexidine Gluconate Cloth  6 each Topical Q0600  . clopidogrel  75 mg Oral Daily  . epoetin (EPOGEN/PROCRIT) injection  4,000 Units Intravenous Q T,Th,Sa-HD  . hydrALAZINE  50 mg Oral Q8H  . insulin aspart  0-5 Units Subcutaneous QHS  . insulin aspart  0-9 Units Subcutaneous TID WC  . losartan  50 mg Oral Daily  . mouth rinse  15 mL Mouth Rinse BID  . oxybutynin  15 mg Oral QHS  . pantoprazole (PROTONIX) IV  40 mg Intravenous Daily  . polyethylene glycol  17 g Oral Daily  . pravastatin  40 mg Oral q1800  . [START ON 09/28/2019] torsemide  40 mg Oral Once per day on Sun Mon Wed Fri   Continuous Infusions: . sodium chloride Stopped (09/22/19 1822)     LOS: 7 days    Time spent: 25 minutes   Edwin Dada, MD [facility] 09/27/2019, 8:12 AM     Please page through [place]:  [email or web address] Password [email or web address] If 7PM-7AM, please contact night-coverage

## 2019-09-27 NOTE — Progress Notes (Signed)
HD Tx Completed   09/27/19 1930  Vital Signs  Pulse Rate 75  Resp (!) 23  BP (!) 158/44  Oxygen Therapy  SpO2 100 %  O2 Device Nasal Cannula  O2 Flow Rate (L/min) 3 L/min  Pain Assessment  Pain Scale 0-10  Pain Score 0  During Hemodialysis Assessment  Blood Flow Rate (mL/min) 400 mL/min  Arterial Pressure (mmHg) -150 mmHg  Venous Pressure (mmHg) 15 mmHg  Transmembrane Pressure (mmHg) 70 mmHg  Ultrafiltration Rate (mL/min) 560 mL/min  Dialysate Flow Rate (mL/min) 800 ml/min  Conductivity: Machine  14  HD Safety Checks Performed Yes  Intra-Hemodialysis Comments Tx completed

## 2019-09-27 NOTE — Progress Notes (Signed)
HD Tx Started    09/27/19 1600  Vital Signs  Pulse Rate 70  Resp 19  BP (!) 146/54  Oxygen Therapy  SpO2 100 %  O2 Device Nasal Cannula  O2 Flow Rate (L/min) 3 L/min  Pain Assessment  Pain Scale 0-10  Pain Score 0  During Hemodialysis Assessment  Blood Flow Rate (mL/min) 400 mL/min  Arterial Pressure (mmHg) -150 mmHg  Venous Pressure (mmHg) 150 mmHg  Transmembrane Pressure (mmHg) 70 mmHg  Ultrafiltration Rate (mL/min) 560 mL/min  Dialysate Flow Rate (mL/min) 800 ml/min  Conductivity: Machine  14.1  HD Safety Checks Performed Yes  Dialysis Fluid Bolus Normal Saline  Bolus Amount (mL) 250 mL  Intra-Hemodialysis Comments Tx initiated

## 2019-09-27 NOTE — Plan of Care (Signed)
  Problem: Clinical Measurements: Goal: Ability to maintain clinical measurements within normal limits will improve Outcome: Progressing Goal: Will remain free from infection Outcome: Progressing Goal: Diagnostic test results will improve Outcome: Progressing Goal: Respiratory complications will improve Outcome: Progressing Goal: Cardiovascular complication will be avoided Outcome: Progressing Pt is Alert and O*4 requested 3 L O2 for comfort. Still receiving dialysis. BP WDL will keep monitoring

## 2019-09-27 NOTE — Progress Notes (Signed)
Progress Note  [name] Date of Encounter: 09/27/2019  Primary Cardiologist:   Ida Rogue, MD   Subjective   No complaints.  No SOB.    Inpatient Medications    Scheduled Meds: . sodium chloride   Intravenous Once  . amLODipine  10 mg Oral Daily  . aspirin  81 mg Oral Daily  . carvedilol  12.5 mg Oral BID WC  . Chlorhexidine Gluconate Cloth  6 each Topical Q0600  . clopidogrel  75 mg Oral Daily  . epoetin (EPOGEN/PROCRIT) injection  4,000 Units Intravenous Q T,Th,Sa-HD  . hydrALAZINE  50 mg Oral Q8H  . insulin aspart  0-5 Units Subcutaneous QHS  . insulin aspart  0-9 Units Subcutaneous TID WC  . losartan  50 mg Oral Daily  . mouth rinse  15 mL Mouth Rinse BID  . oxybutynin  15 mg Oral QHS  . pantoprazole (PROTONIX) IV  40 mg Intravenous Daily  . polyethylene glycol  17 g Oral Daily  . pravastatin  40 mg Oral q1800  . [START ON 09/28/2019] torsemide  40 mg Oral Once per day on Sun Mon Wed Fri   Continuous Infusions: . sodium chloride Stopped (09/22/19 1822)   PRN Meds: sodium chloride, acetaminophen, albuterol, guaiFENesin-dextromethorphan, heparin, HYDROcodone-acetaminophen, midazolam, morphine injection   Vital Signs    Vitals:   09/26/19 1552 09/26/19 2109 09/27/19 0500 09/27/19 0834  BP: 129/89 (!) 140/44  (!) 127/45  Pulse: 72 68  73  Resp: 16 19  16   Temp: 98.6 F (37 C) 98.2 F (36.8 C)  98.4 F (36.9 C)  TempSrc: Oral Oral  Oral  SpO2: 98% 100%  95%  Weight:   62.1 kg   Height:        Intake/Output Summary (Last 24 hours) at 09/27/2019 1347 Last data filed at 09/27/2019 0920 Gross per 24 hour  Intake 240 ml  Output 600 ml  Net -360 ml   Filed Weights   09/25/19 1045 09/26/19 0500 09/27/19 0500  Weight: 63 kg 63.1 kg 62.1 kg    Telemetry    NA - Personally Reviewed  ECG    NA - Personally Reviewed  Physical Exam   GEN: No acute distress.   Neck: No  JVD Cardiac: RRR, no murmurs, rubs, or gallops.   Respiratory:    Bilateral basilar crackles.  GI: Soft, nontender, non-distended  MS: No edema; No deformity. Neuro:  Nonfocal  Psych: Normal affect   Labs    Chemistry Recent Labs  Lab 09/22/19 0752  09/24/19 0346 09/25/19 0547 09/26/19 0335 09/27/19 0443  NA 143   < > 140 139 138  --   K 3.1*   < > 3.5 3.7 3.0*  --   CL 100   < > 97* 96* 99  --   CO2 30   < > 31 28 29   --   GLUCOSE 95   < > 101* 102* 97  --   BUN 18   < > 14 29* 20  --   CREATININE 3.05*   < > 2.54* 4.26* 2.75* 4.37*  CALCIUM 7.8*   < > 8.2* 8.2* 7.6*  --   PROT 5.7*  --   --   --   --   --   ALBUMIN 3.3*  --   --   --   --   --   AST 15  --   --   --   --   --  ALT 12  --   --   --   --   --   ALKPHOS 44  --   --   --   --   --   BILITOT 1.3*  --   --   --   --   --   GFRNONAA 15*   < > 18* 10* 16* 9*  GFRAA 17*   < > 21* 11* 19* 11*  ANIONGAP 13   < > 12 15 10   --    < > = values in this interval not displayed.     Hematology Recent Labs  Lab 09/24/19 0346 09/25/19 0547 09/26/19 0335  WBC 6.0 5.3 4.4  RBC 2.96* 2.94* 2.85*  HGB 8.6* 8.6* 8.2*  HCT 26.4* 26.5* 25.7*  MCV 89.2 90.1 90.2  MCH 29.1 29.3 28.8  MCHC 32.6 32.5 31.9  RDW 14.4 13.8 13.5  PLT 106* 108* 119*    Cardiac EnzymesNo results for input(s): TROPONINI in the last 168 hours. No results for input(s): TROPIPOC in the last 168 hours.   BNPNo results for input(s): BNP, PROBNP in the last 168 hours.   DDimer No results for input(s): DDIMER in the last 168 hours.   Radiology    No results found.  Cardiac Studies     TTE (09/21/2019): 1. Left ventricular ejection fraction, by visual estimation, is 60 to 65%. The left ventricle has normal function. There is mildly increased left ventricular hypertrophy. 2. Global right ventricle has normal systolic function.The right ventricular size is normal. No increase in right ventricular wall thickness. 3. Left atrial size was mild-moderately dilated. 4. Mild mitral valve  regurgitation. 5. The tricuspid valve is normal in structure. Tricuspid valve regurgitation is not demonstrated. 6. The aortic valve was not well visualized. Measurements as detailed below, Moderate aortic valve stenosis by gradients, severe stenosis by estimated AVA. 7. TR signal is inadequate for assessing pulmonary artery systolic pressure. 8. Aortic valve mean gradient measures 20.6 mmHg. 9. Aortic valve peak gradient measures 32.0 mmHg. 10. Aortic valve area, by VTI measures 0.68 cm.  Patient Profile     73 y.o. female with history of end-stage renal disease  on dialysis, aortic stenosis, hyperlipidemia who presents due to shortness of breath, found to have pulmonary edema requiring intubation.    Assessment & Plan    ACUTE RESPIRATORY DISTRESS:  Pulmonary edema.  Volume improved with dialysis.  Negative 1.9 liters.  Making urine.  Continue Torsemide as ordered.    AS:  Moderate.  However, this will need further work up as an out patient after she is further recovered from this acute event.   For questions or updates, please contact [place] Please consult [email or web address] for contact info under Cardiology/STEMI.   Signed, Minus Breeding, MD  09/27/2019, 1:47 PM

## 2019-09-28 LAB — BASIC METABOLIC PANEL
Anion gap: 9 (ref 5–15)
BUN: 16 mg/dL (ref 8–23)
CO2: 31 mmol/L (ref 22–32)
Calcium: 8.2 mg/dL — ABNORMAL LOW (ref 8.9–10.3)
Chloride: 99 mmol/L (ref 98–111)
Creatinine, Ser: 2.62 mg/dL — ABNORMAL HIGH (ref 0.44–1.00)
GFR calc Af Amer: 20 mL/min — ABNORMAL LOW (ref >60)
GFR calc non Af Amer: 17 mL/min — ABNORMAL LOW (ref >60)
Glucose, Bld: 99 mg/dL (ref 70–99)
Potassium: 4.1 mmol/L (ref 3.5–5.1)
Sodium: 139 mmol/L (ref 135–145)

## 2019-09-28 LAB — GLUCOSE, CAPILLARY
Glucose-Capillary: 100 mg/dL — ABNORMAL HIGH (ref 70–99)
Glucose-Capillary: 113 mg/dL — ABNORMAL HIGH (ref 70–99)
Glucose-Capillary: 93 mg/dL (ref 70–99)
Glucose-Capillary: 98 mg/dL (ref 70–99)

## 2019-09-28 LAB — MAGNESIUM: Magnesium: 2.1 mg/dL (ref 1.7–2.4)

## 2019-09-28 LAB — PHOSPHORUS: Phosphorus: 3.1 mg/dL (ref 2.5–4.6)

## 2019-09-28 MED ORDER — BISACODYL 10 MG RE SUPP: 10.0000 mg | Freq: Every day | RECTAL | Status: DC | PRN

## 2019-09-28 MED ORDER — SENNOSIDES-DOCUSATE SODIUM 8.6-50 MG PO TABS: 1.0000 | ORAL_TABLET | Freq: Two times a day (BID) | ORAL | Status: DC | PRN

## 2019-09-28 MED ORDER — SODIUM CHLORIDE 0.9% FLUSH
3.0000 mL | INTRAVENOUS | Status: DC | PRN
  Administered 2019-09-28: 3 mL via INTRAVENOUS

## 2019-09-28 MED ORDER — LACTULOSE 10 GM/15ML PO SOLN
30.0000 g | Freq: Two times a day (BID) | ORAL | Status: DC | PRN
  Administered 2019-09-28: 10:00:00 30 g via ORAL
  Filled 2019-09-28: qty 60

## 2019-09-28 NOTE — Progress Notes (Signed)
PROGRESS NOTE    Phyllis [name]  [id number] DOB: 03-23-1946 DOA: 09/20/2019 PCP: [name], MD      Brief Narrative:  [name] is a 73 y.o. F with ESRD on HD TThS, dCHF, AoCKD, HTN, PVD carotids, DM and moderate AS and asthma who presented with respiratory distress, found to have SpO2 60% by EMS.   In ER, arrived on CPAP, required intubation.  CXR showed edema.          Assessment & Plan:   Acute hypoxic respiratory failure from flash pulmonary edema Acute on chronic diastolic CHF in setting of severe AS Patient admitted with flash pulmonary edema requiring intubation.  Dialyzed and fluid status corrected, able to be extubated.  Given her advanced aortic stenosis and concomitant dialysis-dependence, she has a tenuous and challenging fluid balance.  It seems likely that she may have frequent episodes like this if her valve cannot be repaired, and her prognosis is guarded.  Lasix was stopped and she was started on torsemide on off dialysis days. -Continue fluid management with HD, appreciate nephrology management -Continue torsemide  Possible pneumonia Started on empiric antibiotics at admission.  These were stopped after 1 dose, then developed fever and procal >3 and antibiotics were restarted.  Remains afebrile, without cough or sputum. Has finished Zithromax 5 days -Continue Augmentin, complete 7 days today  ESRD -Consult nephrology for maintenance HD, appreciate intentions  Hypertension Peripheral vascular disease secondary prevention BP improved -Continue amlodipine, carvedilol, losartan, hydralazine -Continue ASA, Plavix   Diabetes Glucoses good -Continue SS correction insulin   Anemia of CKD -Continue EPO per Nephrology  OSA not on CPAP at home -CPAP while here  Thrombocytoepnia Stable  Other medicaitons -Continue oxybutynin         MDM and disposition: The below labs and imaging reports reviewed and summarized  above.  Medication management as above.   The patient was admitted with acute hypoxic respiratory failure from pneumonia fluid overload.    Her fluid overload and pneumonia are resolved.  However she remains significantly debilitated from baseline, and requires substantial rehabilitation return to her prior level of function, and in fact improved to the point that she can get an aortic valve replacement if possible.  At the moment we have no safe alternative discharge plans other than SNF, so we will continue to work towards placement.      DVT prophylaxis: SCDs Code Status: Full code Family Communication:     Consultants:   Critical care  Nephrology  Procedures:   10/31 HD  11/1 HD  11/1 Echocardiogram IMPRESSIONS    1. Left ventricular ejection fraction, by visual estimation, is 60 to 65%. The left ventricle has normal function. There is mildly increased left ventricular hypertrophy.  2. Global right ventricle has normal systolic function.The right ventricular size is normal. No increase in right ventricular wall thickness.  3. Left atrial size was mild-moderately dilated.  4. Mild mitral valve regurgitation.  5. The tricuspid valve is normal in structure. Tricuspid valve regurgitation is not demonstrated.  6. The aortic valve was not well visualized. Measurements as detailed below, Moderate aortic valve stenosis by gradients, severe stenosis by estimated AVA.  7. TR signal is inadequate for assessing pulmonary artery systolic pressure.  8. Aortic valve mean gradient measures 20.6 mmHg.  9. Aortic valve peak gradient measures 32.0 mmHg. 10. Aortic valve area, by VTI measures 0.68 cm.   11/3 HD  11/4 HD      Antimicrobials:   Ceftriaxone 10/31 >>  11.5   -- > Augmentin 11/6>11/7  Azithromycin 10/31 >> 11/4    Subjective: No new chest pain, swelling, dyspnea, palpitations, cough, fever.  She remains extremely weak.    Objective: Vitals:   09/28/19  0539 09/28/19 0700 09/28/19 0718 09/28/19 1539  BP: (!) 154/44  (!) 130/51 (!) 136/53  Pulse: 76  78 74  Resp:   20 17  Temp: 98.6 F (37 C)  98.6 F (37 C) 98.4 F (36.9 C)  TempSrc: Oral  Oral Oral  SpO2: 97%  100% 99%  Weight:  61.1 kg    Height:        Intake/Output Summary (Last 24 hours) at 09/28/2019 2000 Last data filed at 09/28/2019 1400 Gross per 24 hour  Intake 480 ml  Output 1 ml  Net 479 ml   Filed Weights   09/26/19 0500 09/27/19 0500 09/28/19 0700  Weight: 63.1 kg 62.1 kg 61.1 kg    Examination: General appearance: Small elderly adult female, alert and in no acute distress.  Lying in bed. HEENT: Anicteric, conjunctiva pink, lids and lashes normal. No nasal deformity, discharge, epistaxis.  Lips moist, oropharynx moist, no oral lesions, hearing normal   Skin: Warm and dry.  No suspicious rashes or lesions. Cardiac: RRR, no murmurs appreciated.  Trace LE edema.    Respiratory: Normal respiratory rate and rhythm.  CTAB without rales or wheezes. Abdomen: Abdomen soft.  No tenderness palpation or guarding. No ascites, distension, hepatosplenomegaly.   MSK:  Neuro: Awake and alert. Naming is grossly intact, and the patient's recall, recent and remote, as well as general fund of knowledge seem within normal limits.  Muscle tone diminished, without fasciculations.  Moves all extremities generalized weakness.  Speech fluent.    Psych: Sensorium intact and responding to questions, attention normal. Affect normal.  Judgment and insight appear normal.        Data Reviewed: I have personally reviewed following labs and imaging studies:  CBC: Recent Labs  Lab 09/22/19 1645 09/23/19 0346 09/24/19 0346 09/25/19 0547 09/26/19 0335  WBC 9.1 7.2 6.0 5.3 4.4  NEUTROABS  --   --   --  4.2 3.1  HGB 8.5* 7.6* 8.6* 8.6* 8.2*  HCT 26.0* 23.6* 26.4* 26.5* 25.7*  MCV 89.7 89.4 89.2 90.1 90.2  PLT 108* 105* 106* 108* [id number]*   Basic Metabolic Panel: Recent Labs  Lab  09/22/19 0752 09/23/19 0346 09/24/19 0346 09/25/19 0547 09/25/19 1337 09/26/19 0335 09/27/19 0443 09/27/19 1919 09/28/19 0504 09/28/19 1807  NA 143 143 140 139  --  138  --   --  139  --   K 3.1* 3.7 3.5 3.7  --  3.0*  --   --  4.1  --   CL 100 99 97* 96*  --  99  --   --  99  --   CO2 30 28 31 28   --  29  --   --  31  --   GLUCOSE 95 122* 101* 102*  --  97  --   --  99  --   BUN 18 33* 14 29*  --  20  --   --  16  --   CREATININE 3.05* 4.57* 2.54* 4.26*  --  2.75* 4.37*  --  2.62*  --   CALCIUM 7.8* 7.6* 8.2* 8.2*  --  7.6*  --   --  8.2*  --   MG 2.0 1.9  --  1.9  --  1.6*  --   --  2.1  --   PHOS 2.5  --   --   --  1.4* 3.7  --  1.2*  --  3.1   GFR: Estimated Creatinine Clearance: 16.5 mL/min (A) (by C-G formula based on SCr of 2.62 mg/dL (H)). Liver Function Tests: Recent Labs  Lab 09/22/19 0752  AST 15  ALT 12  ALKPHOS 44  BILITOT 1.3*  PROT 5.7*  ALBUMIN 3.3*   Recent Labs  Lab 09/22/19 1353  LIPASE 17   No results for input(s): AMMONIA in the last 168 hours. Coagulation Profile: Recent Labs  Lab 09/21/19 2238 09/22/19 1645  INR 1.2 1.1   Cardiac Enzymes: No results for input(s): CKTOTAL, CKMB, CKMBINDEX, TROPONINI in the last 168 hours. BNP (last 3 results) No results for input(s): PROBNP in the last 8760 hours. HbA1C: No results for input(s): HGBA1C in the last 72 hours. CBG: Recent Labs  Lab 09/27/19 1208 09/27/19 2257 09/28/19 0753 09/28/19 1158 09/28/19 1740  GLUCAP 106* 133* 100* 113* 93   Lipid Profile: No results for input(s): CHOL, HDL, LDLCALC, TRIG, CHOLHDL, LDLDIRECT in the last 72 hours. Thyroid Function Tests: No results for input(s): TSH, T4TOTAL, FREET4, T3FREE, THYROIDAB in the last 72 hours. Anemia Panel: No results for input(s): VITAMINB12, FOLATE, FERRITIN, TIBC, IRON, RETICCTPCT in the last 72 hours. Urine analysis:    Component Value Date/Time   COLORURINE YELLOW (A) 11/28/2018 2146   APPEARANCEUR CLEAR (A)  11/28/2018 2146   LABSPEC 1.012 11/28/2018 2146   PHURINE 8.0 11/28/2018 2146   GLUCOSEU 50 (A) 11/28/2018 2146   HGBUR NEGATIVE 11/28/2018 2146   BILIRUBINUR NEGATIVE 11/28/2018 2146   KETONESUR 5 (A) 11/28/2018 2146   PROTEINUR >=300 (A) 11/28/2018 2146   NITRITE NEGATIVE 11/28/2018 2146   LEUKOCYTESUR NEGATIVE 11/28/2018 2146   Sepsis Labs: @LABRCNTIP (procalcitonin:4,lacticacidven:4)  ) Recent Results (from the past 240 hour(s))  Culture, blood (routine x 2)     Status: None   Collection Time: 09/20/19  5:26 AM   Specimen: BLOOD  Result Value Ref Range Status   Specimen Description BLOOD RIGHT FOREARM  Final   Special Requests   Final    BOTTLES DRAWN AEROBIC AND ANAEROBIC Blood Culture adequate volume   Culture   Final    NO GROWTH 5 DAYS Performed at [facility], [address] [place], [place] [address]    Report Status 09/25/2019 FINAL  Final  Culture, blood (routine x 2)     Status: None   Collection Time: 09/20/19  5:26 AM   Specimen: BLOOD  Result Value Ref Range Status   Specimen Description BLOOD RIGHT HAND  Final   Special Requests   Final    BOTTLES DRAWN AEROBIC AND ANAEROBIC Blood Culture adequate volume   Culture   Final    NO GROWTH 5 DAYS Performed at [facility], [address] [place], [place] [address]    Report Status 09/25/2019 FINAL  Final  SARS Coronavirus 2 by RT PCR (hospital order, performed in [facility] hospital lab) Nasopharyngeal Nasopharyngeal Swab     Status: None   Collection Time: 09/20/19  5:26 AM   Specimen: Nasopharyngeal Swab  Result Value Ref Range Status   SARS Coronavirus 2 NEGATIVE NEGATIVE Final    Comment: (NOTE) If result is NEGATIVE SARS-CoV-2 target nucleic acids are NOT DETECTED. The SARS-CoV-2 RNA is generally detectable in upper and lower  respiratory specimens during the acute phase of infection. The lowest  concentration of SARS-CoV-2 viral copies this assay  can detect is 250  copies /  mL. A negative result does not preclude SARS-CoV-2 infection  and should not be used as the sole basis for treatment or other  patient management decisions.  A negative result may occur with  improper specimen collection / handling, submission of specimen other  than nasopharyngeal swab, presence of viral mutation(s) within the  areas targeted by this assay, and inadequate number of viral copies  (<250 copies / mL). A negative result must be combined with clinical  observations, patient history, and epidemiological information. If result is POSITIVE SARS-CoV-2 target nucleic acids are DETECTED. The SARS-CoV-2 RNA is generally detectable in upper and lower  respiratory specimens dur ing the acute phase of infection.  Positive  results are indicative of active infection with SARS-CoV-2.  Clinical  correlation with patient history and other diagnostic information is  necessary to determine patient infection status.  Positive results do  not rule out bacterial infection or co-infection with other viruses. If result is PRESUMPTIVE POSTIVE SARS-CoV-2 nucleic acids MAY BE PRESENT.   A presumptive positive result was obtained on the submitted specimen  and confirmed on repeat testing.  While 2019 novel coronavirus  (SARS-CoV-2) nucleic acids may be present in the submitted sample  additional confirmatory testing may be necessary for epidemiological  and / or clinical management purposes  to differentiate between  SARS-CoV-2 and other Sarbecovirus currently known to infect humans.  If clinically indicated additional testing with an alternate test  methodology [phone]) is advised. The SARS-CoV-2 RNA is generally  detectable in upper and lower respiratory sp ecimens during the acute  phase of infection. The expected result is Negative. Fact Sheet for Patients:  StrictlyIdeas.no Fact Sheet for Healthcare Providers: BankingDealers.co.za This test is  not yet approved or cleared by the [place] FDA and has been authorized for detection and/or diagnosis of SARS-CoV-2 by FDA under an Emergency Use Authorization (EUA).  This EUA will remain in effect (meaning this test can be used) for the duration of the COVID-19 declaration under Section 564(b)(1) of the Act, 21 U.S.C. section 360bbb-3(b)(1), unless the authorization is terminated or revoked sooner. Performed at [facility], [address] [place], [place] [address]   MRSA PCR Screening     Status: None   Collection Time: 09/20/19 10:12 AM   Specimen: Nasopharyngeal  Result Value Ref Range Status   MRSA by PCR NEGATIVE NEGATIVE Final    Comment:        The GeneXpert MRSA Assay (FDA approved for NASAL specimens only), is one component of a comprehensive MRSA colonization surveillance program. It is not intended to diagnose MRSA infection nor to guide or monitor treatment for MRSA infections. Performed at [facility], [address] [place], [place] [address]   CULTURE, BLOOD (ROUTINE X 2) w Reflex to ID Panel     Status: None (Preliminary result)   Collection Time: 09/22/19  6:30 PM   Specimen: BLOOD  Result Value Ref Range Status   Specimen Description BLOOD BLOOD LEFT HAND  Final   Special Requests   Final    BOTTLES DRAWN AEROBIC ONLY Blood Culture results may not be optimal due to an inadequate volume of blood received in culture bottles   Culture   Final    NO GROWTH 3 DAYS Performed at [facility], [address] [place], [place] [address]    Report Status PENDING  Incomplete  CULTURE, BLOOD (ROUTINE X 2) w Reflex to ID Panel  Status: None (Preliminary result)   Collection Time: 09/22/19  6:53 PM   Specimen: BLOOD  Result Value Ref Range Status   Specimen Description BLOOD BLOOD RIGHT HAND  Final   Special Requests   Final    BOTTLES DRAWN AEROBIC AND ANAEROBIC Blood Culture adequate volume   Culture   Final    NO  GROWTH 3 DAYS Performed at [facility], [address] [place],  [address]    Report Status PENDING  Incomplete         Radiology Studies: No results found.      Scheduled Meds: . sodium chloride   Intravenous Once  . amLODipine  10 mg Oral Daily  . aspirin  81 mg Oral Daily  . carvedilol  12.5 mg Oral BID WC  . Chlorhexidine Gluconate Cloth  6 each Topical Q0600  . clopidogrel  75 mg Oral Daily  . epoetin (EPOGEN/PROCRIT) injection  4,000 Units Intravenous Q T,Th,Sa-HD  . hydrALAZINE  50 mg Oral Q8H  . insulin aspart  0-5 Units Subcutaneous QHS  . insulin aspart  0-9 Units Subcutaneous TID WC  . losartan  50 mg Oral Daily  . mouth rinse  15 mL Mouth Rinse BID  . oxybutynin  15 mg Oral QHS  . pantoprazole (PROTONIX) IV  40 mg Intravenous Daily  . polyethylene glycol  17 g Oral Daily  . pravastatin  40 mg Oral q1800  . torsemide  40 mg Oral Once per day on Sun Mon Wed Fri   Continuous Infusions: . sodium chloride Stopped (09/22/19 1822)     LOS: 8 days    Time spent: 25 minutes  Edwin Dada, MD [facility] 09/28/2019, 8:00 PM     Please page through [place]:  [email or web address] Password [email or web address] If 7PM-7AM, please contact night-coverage

## 2019-09-28 NOTE — Progress Notes (Signed)
[place], [place] 09/28/19  Subjective:  Patient completed dialysis yesterday evening. Tolerated well. It appears that she will be placed into a rehabilitation facility.  Objective:  Vital signs in last 24 hours:  Temp:  [98.3 F (36.8 C)-98.8 F (37.1 C)] 98.6 F (37 C) (11/08 0718) Pulse Rate:  [68-82] 78 (11/08 0718) Resp:  [15-25] 20 (11/08 0718) BP: (114-159)/(43-91) 130/51 (11/08 0718) SpO2:  [97 %-100 %] 100 % (11/08 0718) Weight:  [61.1 kg] 61.1 kg (11/08 0700)  Weight change: -1.043 kg Filed Weights   09/26/19 0500 09/27/19 0500 09/28/19 0700  Weight: 63.1 kg 62.1 kg 61.1 kg    Intake/Output:    Intake/Output Summary (Last 24 hours) at 09/28/2019 1247 Last data filed at 09/28/2019 0900 Gross per 24 hour  Intake 480 ml  Output 1725 ml  Net -1245 ml     Physical Exam: General: No acute distress  HEENT normocephalic, atraumatic, hearing intact  Pulm/lungs clear bilateral, normal effort  CVS/Heart Regular, no rubs  Abdomen:  Soft, non distended  Extremities: No edema  Neurologic: Alert, able to follow simple commands  Skin: No acute rashes, warm dry  Access: Left forearm AVF       Basic Metabolic Panel:  Recent Labs  Lab 09/22/19 0752 09/23/19 0346 09/24/19 0346 09/25/19 0547 09/25/19 1337 09/26/19 0335 09/27/19 0443 09/27/19 1919 09/28/19 0504  NA 143 143 140 139  --  138  --   --  139  K 3.1* 3.7 3.5 3.7  --  3.0*  --   --  4.1  CL 100 99 97* 96*  --  99  --   --  99  CO2 30 28 31 28   --  29  --   --  31  GLUCOSE 95 122* 101* 102*  --  97  --   --  99  BUN 18 33* 14 29*  --  20  --   --  16  CREATININE 3.05* 4.57* 2.54* 4.26*  --  2.75* 4.37*  --  2.62*  CALCIUM 7.8* 7.6* 8.2* 8.2*  --  7.6*  --   --  8.2*  MG 2.0 1.9  --  1.9  --  1.6*  --   --  2.1  PHOS 2.5  --   --   --  1.4* 3.7  --  1.2*  --      CBC: Recent Labs  Lab 09/22/19 1645 09/23/19 0346 09/24/19 0346 09/25/19 0547 09/26/19 0335  WBC  9.1 7.2 6.0 5.3 4.4  NEUTROABS  --   --   --  4.2 3.1  HGB 8.5* 7.6* 8.6* 8.6* 8.2*  HCT 26.0* 23.6* 26.4* 26.5* 25.7*  MCV 89.7 89.4 89.2 90.1 90.2  PLT 108* 105* 106* 108* 119*      Lab Results  Component Value Date   HEPBSAG NON REACTIVE 09/06/2019   HEPBSAB Reactive (A) 09/25/2019      Microbiology:  Recent Results (from the past 240 hour(s))  Culture, blood (routine x 2)     Status: None   Collection Time: 09/20/19  5:26 AM   Specimen: BLOOD  Result Value Ref Range Status   Specimen Description BLOOD RIGHT FOREARM  Final   Special Requests   Final    BOTTLES DRAWN AEROBIC AND ANAEROBIC Blood Culture adequate volume   Culture   Final    NO GROWTH 5 DAYS Performed at [facility], [address], [place], [place] [address]  Report Status 09/25/2019 FINAL  Final  Culture, blood (routine x 2)     Status: None   Collection Time: 09/20/19  5:26 AM   Specimen: BLOOD  Result Value Ref Range Status   Specimen Description BLOOD RIGHT HAND  Final   Special Requests   Final    BOTTLES DRAWN AEROBIC AND ANAEROBIC Blood Culture adequate volume   Culture   Final    NO GROWTH 5 DAYS Performed at [facility], [address], [place], [place] [address]    Report Status 09/25/2019 FINAL  Final  SARS Coronavirus 2 by RT PCR (hospital order, performed in [place] hospital lab) Nasopharyngeal Nasopharyngeal Swab     Status: None   Collection Time: 09/20/19  5:26 AM   Specimen: Nasopharyngeal Swab  Result Value Ref Range Status   SARS Coronavirus 2 NEGATIVE NEGATIVE Final    Comment: (NOTE) If result is NEGATIVE SARS-CoV-2 target nucleic acids are NOT DETECTED. The SARS-CoV-2 RNA is generally detectable in upper and lower  respiratory specimens during the acute phase of infection. The lowest  concentration of SARS-CoV-2 viral copies this assay can detect is 250  copies / mL. A negative result does not preclude SARS-CoV-2 infection  and should not be  used as the sole basis for treatment or other  patient management decisions.  A negative result may occur with  improper specimen collection / handling, submission of specimen other  than nasopharyngeal swab, presence of viral mutation(s) within the  areas targeted by this assay, and inadequate number of viral copies  (<250 copies / mL). A negative result must be combined with clinical  observations, patient history, and epidemiological information. If result is POSITIVE SARS-CoV-2 target nucleic acids are DETECTED. The SARS-CoV-2 RNA is generally detectable in upper and lower  respiratory specimens dur ing the acute phase of infection.  Positive  results are indicative of active infection with SARS-CoV-2.  Clinical  correlation with patient history and other diagnostic information is  necessary to determine patient infection status.  Positive results do  not rule out bacterial infection or co-infection with other viruses. If result is PRESUMPTIVE POSTIVE SARS-CoV-2 nucleic acids MAY BE PRESENT.   A presumptive positive result was obtained on the submitted specimen  and confirmed on repeat testing.  While 2019 novel coronavirus  (SARS-CoV-2) nucleic acids may be present in the submitted sample  additional confirmatory testing may be necessary for epidemiological  and / or clinical management purposes  to differentiate between  SARS-CoV-2 and other Sarbecovirus currently known to infect humans.  If clinically indicated additional testing with an alternate test  methodology [phone]) is advised. The SARS-CoV-2 RNA is generally  detectable in upper and lower respiratory sp ecimens during the acute  phase of infection. The expected result is Negative. Fact Sheet for Patients:  StrictlyIdeas.no Fact Sheet for Healthcare Providers: BankingDealers.co.za This test is not yet approved or cleared by the [place] FDA and has been authorized  for detection and/or diagnosis of SARS-CoV-2 by FDA under an Emergency Use Authorization (EUA).  This EUA will remain in effect (meaning this test can be used) for the duration of the COVID-19 declaration under Section 564(b)(1) of the Act, 21 U.S.C. section 360bbb-3(b)(1), unless the authorization is terminated or revoked sooner. Performed at [facility], [address], [place], [place] [address]   MRSA PCR Screening     Status: None   Collection Time: 09/20/19 10:12 AM   Specimen: Nasopharyngeal  Result Value Ref Range  Status   MRSA by PCR NEGATIVE NEGATIVE Final    Comment:        The GeneXpert MRSA Assay (FDA approved for NASAL specimens only), is one component of a comprehensive MRSA colonization surveillance program. It is not intended to diagnose MRSA infection nor to guide or monitor treatment for MRSA infections. Performed at [facility], [address] [place], [place] [address]   CULTURE, BLOOD (ROUTINE X 2) w Reflex to ID Panel     Status: None (Preliminary result)   Collection Time: 09/22/19  6:30 PM   Specimen: BLOOD  Result Value Ref Range Status   Specimen Description BLOOD BLOOD LEFT HAND  Final   Special Requests   Final    BOTTLES DRAWN AEROBIC ONLY Blood Culture results may not be optimal due to an inadequate volume of blood received in culture bottles   Culture   Final    NO GROWTH 3 DAYS Performed at [facility], [address] [place], [place] [address]    Report Status PENDING  Incomplete  CULTURE, BLOOD (ROUTINE X 2) w Reflex to ID Panel     Status: None (Preliminary result)   Collection Time: 09/22/19  6:53 PM   Specimen: BLOOD  Result Value Ref Range Status   Specimen Description BLOOD BLOOD RIGHT HAND  Final   Special Requests   Final    BOTTLES DRAWN AEROBIC AND ANAEROBIC Blood Culture adequate volume   Culture   Final    NO GROWTH 3 DAYS Performed at [facility], [address]  [place],  [address]    Report Status PENDING  Incomplete    Coagulation Studies: No results for input(s): LABPROT, INR in the last 72 hours.  Urinalysis: No results for input(s): COLORURINE, LABSPEC, PHURINE, GLUCOSEU, HGBUR, BILIRUBINUR, KETONESUR, PROTEINUR, UROBILINOGEN, NITRITE, LEUKOCYTESUR in the last 72 hours.  Invalid input(s): APPERANCEUR    Imaging: No results found.   Medications:   . sodium chloride Stopped (09/22/19 1822)   . sodium chloride   Intravenous Once  . amLODipine  10 mg Oral Daily  . aspirin  81 mg Oral Daily  . carvedilol  12.5 mg Oral BID WC  . Chlorhexidine Gluconate Cloth  6 each Topical Q0600  . clopidogrel  75 mg Oral Daily  . epoetin (EPOGEN/PROCRIT) injection  4,000 Units Intravenous Q T,Th,Sa-HD  . hydrALAZINE  50 mg Oral Q8H  . insulin aspart  0-5 Units Subcutaneous QHS  . insulin aspart  0-9 Units Subcutaneous TID WC  . losartan  50 mg Oral Daily  . mouth rinse  15 mL Mouth Rinse BID  . oxybutynin  15 mg Oral QHS  . pantoprazole (PROTONIX) IV  40 mg Intravenous Daily  . polyethylene glycol  17 g Oral Daily  . pravastatin  40 mg Oral q1800  . torsemide  40 mg Oral Once per day on Sun Mon Wed Fri     Assessment/ Plan:  73 y.o. female with end stage renal disease on hemodialysis, hypertension, diabetes mellitus type II, overactive bladder, gout, COPD, congestive heart failure, coronary artery disease, aortic atherosclerosis  Active Problems:   Congestive heart failure (HCC)   Acute respiratory failure with hypoxemia ([place])   Aortic valve stenosis  [facility]// [facility] //TuThSa-2// TW 60kg  #. ESRD with acute resp failure -Patient completed dialysis yesterday.  No acute indication for dialysis today.  Next Alysis treatment on Tuesday.  #. Anemia of CKD  Lab Results  Component Value Date  HGB 8.2 (L) 09/26/2019  Continue Epogen 4000 IV with dialysis.  #. SHPTH     Component Value Date/Time   PTH 160 (H)  05/24/2019 0413   Lab Results  Component Value Date   PHOS 1.2 (L) 09/27/2019  Phosphorus drawn yesterday was during dialysis treatment.  Repeat serum phosphorus today.         LOS: 8 Emylee Decelle 11/8/202012:47 PM  [place], [place]

## 2019-09-28 NOTE — Plan of Care (Signed)

## 2019-09-28 NOTE — Progress Notes (Signed)
Pt reports doing well; awaiting rehab arrangements. Ambulated in hall with walker to intersection of hall at nurse's desk and back to room and reports tolerated well. Pt had excellent results with the addition of lactulose for constipation. Tylenol x 1 effective for chronic pain in chest wall.

## 2019-09-28 NOTE — Progress Notes (Addendum)
Pharmacy Electrolyte Monitoring Consult:  Phyllis [name] is a 85 YOF admitted on 09/20/2019 with respiratory distress. She is a lifelong never smoker with ESRD on HD (Tues, Thurs, Sat). Patient presented with acute hypoxic respiratory failure due to volume overload.  She was admitted to the ICU, intubated, and mechanically ventilated. Patient subsequently extubated and is currently stepdown status.   Patient received dialysis on 11/5. Patient to receive torsemide 40mg  daily on non-dialysis days. Plan for HD 11/7.   Labs:  Sodium (mmol/L)  Date Value  09/28/2019 139  12/10/2014 142   Potassium (mmol/L)  Date Value  09/28/2019 4.1  12/10/2014 3.6   Magnesium (mg/dL)  Date Value  09/28/2019 2.1   Phosphorus (mg/dL)  Date Value  09/27/2019 1.2 (L)  12/09/2014 4.4   Calcium (mg/dL)  Date Value  09/28/2019 8.2 (L)   Calcium, Total (mg/dL)  Date Value  12/10/2014 8.6   Albumin (g/dL)  Date Value  09/22/2019 3.3 (L)  12/07/2014 2.5 (L)   Corrected Calcium: 8.2 mg/dL  Electrolytes:  Assessment/Plan: - Electrolyte protocol was ordered by CCM team. Pt is on the floor. Pharmacy will sign off.  - Phos low per Dr. Holley Raring rechecking level, if still remains low recommend replacing.  - Pt is on losartan 50 mg daily.  - Will check BMP and Mg with AM labs and supplement accordingly.  Goals: -Maintain electrolytes within normal limits   Glucose:  - Continue SSI as ordered. BG are within goal range.   Pharmacy will continue to monitor and adjust per consult.   Oswald Hillock, PharmD, BCPS 09/28/2019 10:26 AM

## 2019-09-28 NOTE — Progress Notes (Signed)
Pharmacy Electrolyte Monitoring Consult:  Phyllis [name] is a 75 YOF admitted on 09/20/2019 with respiratory distress. She is a lifelong never smoker with ESRD on HD (Tues, Thurs, Sat). Patient presented with acute hypoxic respiratory failure due to volume overload.  She was admitted to the ICU, intubated, and mechanically ventilated. Patient subsequently extubated and is currently stepdown status.   Patient received dialysis on 11/5. Patient to receive torsemide 40mg  daily on non-dialysis days. Plan for HD 11/7.   Labs:  Sodium (mmol/L)  Date Value  09/28/2019 139  12/10/2014 142   Potassium (mmol/L)  Date Value  09/28/2019 4.1  12/10/2014 3.6   Magnesium (mg/dL)  Date Value  09/28/2019 2.1   Phosphorus (mg/dL)  Date Value  09/28/2019 3.1  12/09/2014 4.4   Calcium (mg/dL)  Date Value  09/28/2019 8.2 (L)   Calcium, Total (mg/dL)  Date Value  12/10/2014 8.6   Albumin (g/dL)  Date Value  09/22/2019 3.3 (L)  12/07/2014 2.5 (L)   Corrected Calcium: 8.2 mg/dL  Electrolytes:  Assessment/Plan: - Electrolyte protocol was ordered by CCM team. Pt is on the floor. Pharmacy will sign off.  - Phos low per Dr. Holley Raring rechecking level, if still remains low recommend replacing.  - Pt is on losartan 50 mg daily.  - Will check BMP and Mg with AM labs and supplement accordingly.  11/8:   Phos @ 1807 = 3.1 No additional phos supplementation needed.  Will recheck electrolytes on 11/9 with AM labs.   Goals: -Maintain electrolytes within normal limits   Glucose:  - Continue SSI as ordered. BG are within goal range.   Pharmacy will continue to monitor and adjust per consult.   Phyllis [name] D, PharmD 09/28/2019 7:44 PM

## 2019-09-29 LAB — BASIC METABOLIC PANEL
Anion gap: 12 (ref 5–15)
BUN: 26 mg/dL — ABNORMAL HIGH (ref 8–23)
CO2: 27 mmol/L (ref 22–32)
Calcium: 8.6 mg/dL — ABNORMAL LOW (ref 8.9–10.3)
Chloride: 97 mmol/L — ABNORMAL LOW (ref 98–111)
Creatinine, Ser: 4.49 mg/dL — ABNORMAL HIGH (ref 0.44–1.00)
GFR calc Af Amer: 11 mL/min — ABNORMAL LOW (ref >60)
GFR calc non Af Amer: 9 mL/min — ABNORMAL LOW (ref >60)
Glucose, Bld: 110 mg/dL — ABNORMAL HIGH (ref 70–99)
Potassium: 4.3 mmol/L (ref 3.5–5.1)
Sodium: 136 mmol/L (ref 135–145)

## 2019-09-29 LAB — GLUCOSE, CAPILLARY
Glucose-Capillary: 83 mg/dL (ref 70–99)
Glucose-Capillary: 120 mg/dL — ABNORMAL HIGH (ref 70–99)
Glucose-Capillary: 109 mg/dL — ABNORMAL HIGH (ref 70–99)
Glucose-Capillary: 127 mg/dL — ABNORMAL HIGH (ref 70–99)

## 2019-09-29 MED ORDER — PANTOPRAZOLE SODIUM 40 MG PO TBEC
40.0000 mg | DELAYED_RELEASE_TABLET | Freq: Every day | ORAL | Status: DC
  Administered 2019-09-29 – 2019-09-30 (×2): 40 mg via ORAL
  Filled 2019-09-29 (×2): qty 1

## 2019-09-29 MED ORDER — TORSEMIDE 20 MG PO TABS: 40.0000 mg | ORAL_TABLET | ORAL | Status: DC

## 2019-09-29 MED ORDER — ASPIRIN 81 MG PO CHEW: 81.0000 mg | CHEWABLE_TABLET | Freq: Every day | ORAL | Status: DC

## 2019-09-29 MED ORDER — ACETAMINOPHEN 500 MG PO TABS: 500.0000 mg | ORAL_TABLET | Freq: Four times a day (QID) | ORAL | 0 refills | Status: DC | PRN

## 2019-09-29 MED ORDER — HYDRALAZINE HCL 50 MG PO TABS: 50.0000 mg | ORAL_TABLET | Freq: Three times a day (TID) | ORAL | Status: DC

## 2019-09-29 MED ORDER — ALPRAZOLAM 0.25 MG PO TABS
0.2500 mg | ORAL_TABLET | Freq: Once | ORAL | Status: AC
  Administered 2019-09-30: 02:00:00 0.25 mg via ORAL
  Filled 2019-09-29: qty 1

## 2019-09-29 MED ORDER — HYDROCODONE-ACETAMINOPHEN 7.5-325 MG PO TABS: 1.0000 | ORAL_TABLET | Freq: Every day | ORAL | 0 refills | Status: DC | PRN

## 2019-09-29 NOTE — Plan of Care (Signed)

## 2019-09-29 NOTE — TOC Progression Note (Cosign Needed)
Transition of Care [name]) - Progression Note    Patient Details  Name: Phyllis [name] MRN: [id number] Date of Birth: 1945/11/29  Transition of Care [facility]) CM/SW [place], RN Phone Number: 09/29/2019, 9:39 AM  Clinical Narrative:     I reached out to each Nursing facility to request them to look at the bed offer, will review beds once offers are in. DC once bed available Expected Discharge Plan: [place] Barriers to Discharge: Continued Medical Work up  Expected Discharge Plan and Services Expected Discharge Plan: [place] In-house Referral: Clinical Social Work   Post Acute Care Choice: [place] arrangements for the past 2 months: [place] Agency: [place] ([facility])         Social Determinants of Health (SDOH) Interventions    Readmission Risk Interventions Readmission Risk Prevention Plan 09/26/2019 05/07/2019  Transportation Screening Complete Complete  Medication Review Press photographer) Complete Complete  PCP or Specialist appointment within 3-5 days of discharge Complete Complete  HRI or Home Care Consult Complete Complete  SW Recovery Care/Counseling Consult Complete -  Palliative Care Screening Not Applicable Not [place] Not Applicable Not Applicable  Some recent data might be hidden

## 2019-09-29 NOTE — Care Management Important Message (Signed)
Important Message  Patient Details  Name: MASIEL LOFTHOUSE MRN: [id number] Date of Birth: 02-12-1946   Medicare Important Message Given:  Yes     Juliann Pulse A Perez Dirico 09/29/2019, 10:53 AM

## 2019-09-29 NOTE — Progress Notes (Signed)
[facility]  ROUNDING NOTE   Subjective:   Patient is waiting on SNF placement. No complaints. No shortness of breath.  Reports some wheezing last night.   Objective:  Vital signs in last 24 hours:  Temp:  [98.2 F (36.8 C)-99 F (37.2 C)] 98.2 F (36.8 C) (11/09 0742) Pulse Rate:  [74-80] 80 (11/09 0930) Resp:  [17-18] 18 (11/09 0742) BP: (136-155)/(44-57) 155/57 (11/09 0930) SpO2:  [96 %-99 %] 97 % (11/09 0742)  Weight change:  Filed Weights   09/26/19 0500 09/27/19 0500 09/28/19 0700  Weight: 63.1 kg 62.1 kg 61.1 kg    Intake/Output: I/O last 3 completed shifts: In: 480 [P.O.:480] Out: 1901 [Urine:400; Other:1500; Stool:1]   Intake/Output this shift:  Total I/O In: 240 [P.O.:240] Out: -   Physical Exam: General: NAD,   Head: Normocephalic, atraumatic. Moist oral mucosal membranes  Eyes: Anicteric, PERRL  Neck: Supple, trachea midline  Lungs:  Clear to auscultation  Heart: Regular rate and rhythm  Abdomen:  Soft, nontender,   Extremities:  no peripheral edema.  Neurologic: Nonfocal, moving all four extremities  Skin: No lesions  Access: Left AVF    Basic Metabolic Panel: Recent Labs  Lab 09/23/19 0346 09/24/19 0346 09/25/19 0547 09/25/19 1337 09/26/19 0335 09/27/19 0443 09/27/19 1919 09/28/19 0504 09/28/19 1807 09/29/19 0830  NA 143 140 139  --  138  --   --  139  --  136  K 3.7 3.5 3.7  --  3.0*  --   --  4.1  --  4.3  CL 99 97* 96*  --  99  --   --  99  --  97*  CO2 28 31 28   --  29  --   --  31  --  27  GLUCOSE 122* 101* 102*  --  97  --   --  99  --  110*  BUN 33* 14 29*  --  20  --   --  16  --  26*  CREATININE 4.57* 2.54* 4.26*  --  2.75* 4.37*  --  2.62*  --  4.49*  CALCIUM 7.6* 8.2* 8.2*  --  7.6*  --   --  8.2*  --  8.6*  MG 1.9  --  1.9  --  1.6*  --   --  2.1  --   --   PHOS  --   --   --  1.4* 3.7  --  1.2*  --  3.1  --     Liver Function Tests: No results for input(s): AST, ALT, ALKPHOS, BILITOT, PROT, ALBUMIN in  the last 168 hours. Recent Labs  Lab 09/22/19 1353  LIPASE 17   No results for input(s): AMMONIA in the last 168 hours.  CBC: Recent Labs  Lab 09/22/19 1645 09/23/19 0346 09/24/19 0346 09/25/19 0547 09/26/19 0335  WBC 9.1 7.2 6.0 5.3 4.4  NEUTROABS  --   --   --  4.2 3.1  HGB 8.5* 7.6* 8.6* 8.6* 8.2*  HCT 26.0* 23.6* 26.4* 26.5* 25.7*  MCV 89.7 89.4 89.2 90.1 90.2  PLT 108* 105* 106* 108* 119*    Cardiac Enzymes: No results for input(s): CKTOTAL, CKMB, CKMBINDEX, TROPONINI in the last 168 hours.  BNP: Invalid input(s): POCBNP  CBG: Recent Labs  Lab 09/28/19 0753 09/28/19 1158 09/28/19 1740 09/28/19 2242 09/29/19 0742  GLUCAP 100* 113* 93 98 83    Microbiology: Results for orders placed or performed during the hospital encounter  of 09/20/19  Culture, blood (routine x 2)     Status: None   Collection Time: 09/20/19  5:26 AM   Specimen: BLOOD  Result Value Ref Range Status   Specimen Description BLOOD RIGHT FOREARM  Final   Special Requests   Final    BOTTLES DRAWN AEROBIC AND ANAEROBIC Blood Culture adequate volume   Culture   Final    NO GROWTH 5 DAYS Performed at [facility], [address], [place], [place] [address]    Report Status 09/25/2019 FINAL  Final  Culture, blood (routine x 2)     Status: None   Collection Time: 09/20/19  5:26 AM   Specimen: BLOOD  Result Value Ref Range Status   Specimen Description BLOOD RIGHT HAND  Final   Special Requests   Final    BOTTLES DRAWN AEROBIC AND ANAEROBIC Blood Culture adequate volume   Culture   Final    NO GROWTH 5 DAYS Performed at [facility], [address], [place], [place] [address]    Report Status 09/25/2019 FINAL  Final  SARS Coronavirus 2 by RT PCR (hospital order, performed in [place] hospital lab) Nasopharyngeal Nasopharyngeal Swab     Status: None   Collection Time: 09/20/19  5:26 AM   Specimen: Nasopharyngeal Swab  Result Value Ref Range Status   SARS  Coronavirus 2 NEGATIVE NEGATIVE Final    Comment: (NOTE) If result is NEGATIVE SARS-CoV-2 target nucleic acids are NOT DETECTED. The SARS-CoV-2 RNA is generally detectable in upper and lower  respiratory specimens during the acute phase of infection. The lowest  concentration of SARS-CoV-2 viral copies this assay can detect is 250  copies / mL. A negative result does not preclude SARS-CoV-2 infection  and should not be used as the sole basis for treatment or other  patient management decisions.  A negative result may occur with  improper specimen collection / handling, submission of specimen other  than nasopharyngeal swab, presence of viral mutation(s) within the  areas targeted by this assay, and inadequate number of viral copies  (<250 copies / mL). A negative result must be combined with clinical  observations, patient history, and epidemiological information. If result is POSITIVE SARS-CoV-2 target nucleic acids are DETECTED. The SARS-CoV-2 RNA is generally detectable in upper and lower  respiratory specimens dur ing the acute phase of infection.  Positive  results are indicative of active infection with SARS-CoV-2.  Clinical  correlation with patient history and other diagnostic information is  necessary to determine patient infection status.  Positive results do  not rule out bacterial infection or co-infection with other viruses. If result is PRESUMPTIVE POSTIVE SARS-CoV-2 nucleic acids MAY BE PRESENT.   A presumptive positive result was obtained on the submitted specimen  and confirmed on repeat testing.  While 2019 novel coronavirus  (SARS-CoV-2) nucleic acids may be present in the submitted sample  additional confirmatory testing may be necessary for epidemiological  and / or clinical management purposes  to differentiate between  SARS-CoV-2 and other Sarbecovirus currently known to infect humans.  If clinically indicated additional testing with an alternate test   methodology [id number]) is advised. The SARS-CoV-2 RNA is generally  detectable in upper and lower respiratory sp ecimens during the acute  phase of infection. The expected result is Negative. Fact Sheet for Patients:  StrictlyIdeas.no Fact Sheet for Healthcare Providers: BankingDealers.co.za This test is not yet approved or cleared by the [place] FDA and has been authorized for detection and/or  diagnosis of SARS-CoV-2 by FDA under an Emergency Use Authorization (EUA).  This EUA will remain in effect (meaning this test can be used) for the duration of the COVID-19 declaration under Section 564(b)(1) of the Act, 21 U.S.C. section 360bbb-3(b)(1), unless the authorization is terminated or revoked sooner. Performed at [facility], [address] [place], [place] [address]   MRSA PCR Screening     Status: None   Collection Time: 09/20/19 10:12 AM   Specimen: Nasopharyngeal  Result Value Ref Range Status   MRSA by PCR NEGATIVE NEGATIVE Final    Comment:        The GeneXpert MRSA Assay (FDA approved for NASAL specimens only), is one component of a comprehensive MRSA colonization surveillance program. It is not intended to diagnose MRSA infection nor to guide or monitor treatment for MRSA infections. Performed at [facility] [address] [place], [place] [address]   CULTURE, BLOOD (ROUTINE X 2) w Reflex to ID Panel     Status: None (Preliminary result)   Collection Time: 09/22/19  6:30 PM   Specimen: BLOOD  Result Value Ref Range Status   Specimen Description BLOOD BLOOD LEFT HAND  Final   Special Requests   Final    BOTTLES DRAWN AEROBIC ONLY Blood Culture results may not be optimal due to an inadequate volume of blood received in culture bottles   Culture   Final    NO GROWTH 3 DAYS Performed at [facility], [address], [place], [place] [address]    Report Status PENDING  Incomplete   CULTURE, BLOOD (ROUTINE X 2) w Reflex to ID Panel     Status: None (Preliminary result)   Collection Time: 09/22/19  6:53 PM   Specimen: BLOOD  Result Value Ref Range Status   Specimen Description BLOOD BLOOD RIGHT HAND  Final   Special Requests   Final    BOTTLES DRAWN AEROBIC AND ANAEROBIC Blood Culture adequate volume   Culture   Final    NO GROWTH 3 DAYS Performed at [facility], [address] [place], [place] [address]    Report Status PENDING  Incomplete    Coagulation Studies: No results for input(s): LABPROT, INR in the last 72 hours.  Urinalysis: No results for input(s): COLORURINE, LABSPEC, PHURINE, GLUCOSEU, HGBUR, BILIRUBINUR, KETONESUR, PROTEINUR, UROBILINOGEN, NITRITE, LEUKOCYTESUR in the last 72 hours.  Invalid input(s): APPERANCEUR    Imaging: No results found.   Medications:   . sodium chloride Stopped (09/22/19 1822)   . sodium chloride   Intravenous Once  . amLODipine  10 mg Oral Daily  . aspirin  81 mg Oral Daily  . carvedilol  12.5 mg Oral BID WC  . Chlorhexidine Gluconate Cloth  6 each Topical Q0600  . clopidogrel  75 mg Oral Daily  . epoetin (EPOGEN/PROCRIT) injection  4,000 Units Intravenous Q T,Th,Sa-HD  . hydrALAZINE  50 mg Oral Q8H  . insulin aspart  0-5 Units Subcutaneous QHS  . insulin aspart  0-9 Units Subcutaneous TID WC  . losartan  50 mg Oral Daily  . mouth rinse  15 mL Mouth Rinse BID  . oxybutynin  15 mg Oral QHS  . pantoprazole  40 mg Oral Daily  . polyethylene glycol  17 g Oral Daily  . pravastatin  40 mg Oral q1800  . torsemide  40 mg Oral Once per day on Sun Mon Wed Fri   sodium chloride, acetaminophen, albuterol, bisacodyl, guaiFENesin-dextromethorphan, HYDROcodone-acetaminophen, lactulose, midazolam, morphine injection, senna-docusate,  sodium chloride flush  Assessment/ Plan:  [name] is a 73 y.o. white female with end stage renal disease on hemodialysis, hypertension, diabetes mellitus type  II, overactive bladder, gout, COPD, congestive heart failure, coronary artery disease, aortic atherosclerosis admitted to [facility] on 09/20/2019 for Respiratory distress [R06.03] Hypoxia [R09.02] COPD exacerbation ([place]) [J44.1] ESRD (end stage renal disease) on dialysis ([place]) [N18.6, Z99.2] Community acquired pneumonia, unspecified laterality [J18.9] Congestive heart failure, unspecified HF chronicity, unspecified heart failure type ([place]) [I50.9]  [facility] TTS 60kg  #. ESRD: dialysis for tomorrow.  -Patient completed dialysis yesterday.  No acute indication for dialysis today.  Next Alysis treatment on Tuesday.  #. Anemia of CKD - EPO with HD treatment  #. SHPTH - holding binders due to low phosphorus level.   # Hypertension - losartan, torsemide, carvedilol, hydralazine.    LOS: 9 Sharen Youngren 11/9/202012:02 PM

## 2019-09-29 NOTE — TOC Progression Note (Signed)
Transition of Care [facility]) - Progression Note    Patient Details  Name: LISIA DESARRO MRN: [id number] Date of Birth: [birth year]  [facility]) CM/SW Contact  Su Hilt, RN Phone Number: 09/29/2019, 12:55 PM  Clinical Narrative:    Patient accepted the offer at [facility], I  Notified leslie at [facility].     Expected Discharge Plan: [place] Barriers to Discharge: Continued Medical Work up  Expected Discharge Plan and Services Expected Discharge Plan: [place] In-house Referral: Clinical Social Work   Post Acute Care Choice: [place] arrangements for the past 2 months: [place] Expected Discharge Date: 09/29/19                           [facility] Agency: [place] ([facility])         Social Determinants of Health (SDOH) Interventions    Readmission Risk Interventions Readmission Risk Prevention Plan 09/26/2019 05/07/2019  Transportation Screening Complete Complete  Medication Review Press photographer) Complete Complete  PCP or Specialist appointment within 3-5 days of discharge Complete Complete  HRI or Home Care Consult Complete Complete  SW Recovery Care/Counseling Consult Complete -  Palliative Care Screening Not Applicable Not [place] Not Applicable Not Applicable  Some recent data might be hidden

## 2019-09-29 NOTE — TOC Transition Note (Signed)
Transition of Care [name]) - CM/SW Discharge Note   Patient Details  Name: KHILYN ZAJICEK MRN: [id number] Date of Birth: [birth year]  Transition of Care [facility]) CM/SW Contact:  Su Hilt, RN Phone Number: 09/29/2019, 12:56 PM   Clinical Narrative:    Patient to DC to [facility] today The bedside nurse is to call report to [place] Nurse to call for EMS today for transport when reasdy   Final next level of care: Skilled Nursing Facility Barriers to Discharge: Barriers Resolved   Patient Goals and CMS Choice Patient states their goals for this hospitalization and ongoing recovery are:: Go home CMS Medicare.gov Compare Post Acute Care list provided to:: Patient Choice offered to / list presented to : Patient  Discharge Placement                       Discharge Plan and Services In-house Referral: Clinical Social Work   Post Acute Care Choice: [place] Agency: [place] ([facility])        Social Determinants of Health (SDOH) Interventions     Readmission Risk Interventions Readmission Risk Prevention Plan 09/26/2019 05/07/2019  Transportation Screening Complete Complete  Medication Review Press photographer) Complete Complete  PCP or Specialist appointment within 3-5 days of discharge Complete Complete  HRI or Home Care Consult Complete Complete  SW Recovery Care/Counseling Consult Complete -  Palliative Care Screening Not Applicable Not [place] Not Applicable Not Applicable  Some recent data might be hidden

## 2019-09-29 NOTE — Discharge Summary (Signed)
Physician Discharge Summary  Phyllis [name] [id number] DOB: 1946-10-29 DOA: 09/20/2019  PCP: [name], MD  Admit date: 09/20/2019 Discharge date: 09/29/2019  Admitted From: Home  Disposition:  SNF   Recommendations for Outpatient Follow-up:  1. Follow up with PCP in 1-2 weeks after discharge from SNF 2. Follow up with Cardiology re: Aortic stenosis as soon as able      Home Health: TBD at SNF  Equipment/Devices: TBD at SNF  Discharge Condition: Fair  CODE STATUS: FULL Diet recommendation: Diabetic, cardiac, renal  Brief/Interim Summary: Phyllis [name] is a 73 y.o. F with ESRD on HD TThS, dCHF, AoCKD, HTN, PVD carotids, DM and moderate AS and asthma who presented with respiratory distress, found to have SpO2 60% by EMS.   In ER, arrived on CPAP, required intubation.  CXR showed edema.       PRINCIPAL HOSPITAL DIAGNOSIS: Acute respiratory failure due to flash pulmonary edema    Discharge Diagnoses:   Acute hypoxic respiratory failure from flash pulmonary edema Acute on chronic diastolic CHF in setting of severe AS Patient admitted with flash pulmonary edema requiring intubation.  Dialyzed and fluid status corrected, able to be extubated.  Given her advanced aortic stenosis and concomitant dialysis-dependence, she has a tenuous and challenging fluid balance.  It seems likely that she may have frequent episodes like this if her valve cannot be repaired, and her prognosis is guarded.  Lasix was stopped and she was started on torsemide on off dialysis days, and she remained clinically stable.   Possible pneumonia Started on empiric antibiotics at admission.  These were stopped after 1 dose, then developed fever and procal >3 and antibiotics were restarted.  Remains afebrile, without cough or sputum. Has finished Zithromax 5 days, and 7 days beta-lactam.  ESRD Dialysis Tuesday, Thursday, Saturday  Hypertension Peripheral vascular disease  secondary prevention   Diabetes  Anemia of CKD  OSA not on CPAP at home  Thrombocytoepnia             Discharge Instructions  Discharge Instructions    Discharge instructions   Complete by: As directed    From Dr. Loleta Books: You were admitted for fluid overload. You got dialysis and this got better. We weren't sure if you had a pneumonia on top of it, so we treated you with antibiotics. You completed the course of antibiotics.   We have made two small changes to your medicines: Stop Lasix and start torsemide (this is an alternate diuretic) Reduce your dose of hydralazine  Follow up with Dr. Rockey Situ Follow up with your primary care doctor asap after you get out of rehab   Increase activity slowly   Complete by: As directed      Allergies as of 09/29/2019      Reactions   Enalapril Maleate Other (See Comments)   Other reaction(s): Headache   Nitrofurantoin Swelling, Rash   Other Reaction: swelling of body   Sulfamethoxazole-trimethoprim Swelling   2,4-d Dimethylamine (amisol) Rash, Other (See Comments)   Other Reaction: h/a   Baclofen Other (See Comments), Nausea Only   lightheadness ,drowsiness , muscle weakness , twitching in hands    Neosporin [neomycin-bacitracin Zn-polymyx] Other (See Comments), Rash   Other Reaction: irritation Skin irritation   Quinine Nausea And Vomiting, Rash, Other (See Comments)   Other Reaction: Vomiting, rash, h/a, vision   Ultram [tramadol] Palpitations   Zocor [simvastatin] Other (See Comments), Rash   Other Reaction: muscle spasms Muscle pain and spasms   Bactrim [sulfamethoxazole-trimethoprim]  Swelling   Levodopa Other (See Comments)   Reaction: unknown   Macrodantin [nitrofurantoin Macrocrystal] Swelling   Quinine Derivatives Other (See Comments)   Vertigo,nausea vomiting blurred vision headache ears sensitivity      Medication List    STOP taking these medications   furosemide 80 MG tablet Commonly known  as: LASIX     TAKE these medications   acetaminophen 500 MG tablet Commonly known as: TYLENOL Take 1 tablet (500 mg total) by mouth every 6 (six) hours as needed for mild pain or fever.   albuterol 108 (90 Base) MCG/ACT inhaler Commonly known as: VENTOLIN HFA Inhale 2 puffs into the lungs every 6 (six) hours as needed for wheezing or shortness of breath. What changed: Another medication with the same name was removed. Continue taking this medication, and follow the directions you see here.   amLODipine 10 MG tablet Commonly known as: NORVASC Take 10 mg by mouth daily.   aspirin 81 MG chewable tablet Chew 1 tablet (81 mg total) by mouth daily. Start taking on: September 30, 2019   calcium acetate 667 MG capsule Commonly known as: PHOSLO Take 1,334 mg by mouth 3 (three) times daily with meals.   carvedilol 12.5 MG tablet Commonly known as: COREG Take 12.5 mg by mouth 2 (two) times daily with a meal.   cetirizine 10 MG tablet Commonly known as: ZYRTEC Take 10 mg by mouth daily as needed for allergies.   clopidogrel 75 MG tablet Commonly known as: PLAVIX Take 1 tablet (75 mg total) by mouth daily.   Fluticasone-Salmeterol 250-50 MCG/DOSE Aepb Commonly known as: ADVAIR Inhale 1 puff into the lungs 2 (two) times daily as needed (for shortness of breath).   hydrALAZINE 50 MG tablet Commonly known as: APRESOLINE Take 1 tablet (50 mg total) by mouth every 8 (eight) hours. What changed:   medication strength  how much to take  when to take this   HYDROcodone-acetaminophen 7.5-325 MG tablet Commonly known as: NORCO Take 1-2 tablets by mouth daily as needed (pain).   lidocaine-prilocaine cream Commonly known as: EMLA Apply 1 application topically as needed (prior to treatment).   losartan 50 MG tablet Commonly known as: COZAAR Take 50 mg by mouth daily.   omeprazole 20 MG capsule Commonly known as: PRILOSEC Take 1 capsule (20 mg total) by mouth 2 (two) times daily  before a meal.   ondansetron 4 MG tablet Commonly known as: ZOFRAN Take 4 mg by mouth every 8 (eight) hours as needed.   oxybutynin 15 MG 24 hr tablet Commonly known as: DITROPAN XL Take 15 mg by mouth daily.   pravastatin 40 MG tablet Commonly known as: PRAVACHOL Take 40 mg by mouth at bedtime.   topiramate 25 MG tablet Commonly known as: TOPAMAX Take 1 tablet (25 mg total) by mouth as needed (for headaches).   torsemide 20 MG tablet Commonly known as: DEMADEX Take 2 tablets (40 mg total) by mouth every Monday,Wednesday,Friday, and Sunday at 6 PM. Start taking on: October 01, 2019       Contact information for follow-up providers    Rise Mu, PA-C. Go on 10/07/2019.   Specialties: Physician Assistant, Cardiology, Radiology Why: @9 :30 AM  Contact information: [address] [place] [address] [phone]        [name], MD. Go on 10/20/2019.   Specialty: Internal Medicine Why: @2 :30 PM Contact information: [address] [place] [address] [phone]  Contact information for after-discharge care    [facility] .   Service: Skilled Nursing Contact information: [place] [phone]                 Allergies  Allergen Reactions  . Enalapril Maleate Other (See Comments)    Other reaction(s): Headache  . Nitrofurantoin Swelling and Rash    Other Reaction: swelling of body  . Sulfamethoxazole-Trimethoprim Swelling  . 2,4-D Dimethylamine (Amisol) Rash and Other (See Comments)    Other Reaction: h/a  . Baclofen Other (See Comments) and Nausea Only    lightheadness ,drowsiness , muscle weakness , twitching in hands   . Neosporin [Neomycin-Bacitracin Zn-Polymyx] Other (See Comments) and Rash    Other Reaction: irritation Skin irritation   . Quinine Nausea And Vomiting, Rash and Other  (See Comments)    Other Reaction: Vomiting, rash, h/a, vision  . Ultram [Tramadol] Palpitations  . Zocor [Simvastatin] Other (See Comments) and Rash    Other Reaction: muscle spasms Muscle pain and spasms  . Bactrim [Sulfamethoxazole-Trimethoprim] Swelling  . Levodopa Other (See Comments)    Reaction: unknown  . Macrodantin [Nitrofurantoin Macrocrystal] Swelling  . Quinine Derivatives Other (See Comments)    Vertigo,nausea vomiting blurred vision headache ears sensitivity     Consultations:  Cardiology  Nephrology  Critical care   Procedures/Studies: Dg Abd 1 View  Result Date: 09/20/2019 CLINICAL DATA:  OG tube placement EXAM: ABDOMEN - 1 VIEW COMPARISON:  None. FINDINGS: OG tube appears adequately positioned in the stomach with proximal sideholes just below the level of the gastroesophageal junction. Visualized bowel gas pattern is nonobstructive. No evidence of free intraperitoneal air or abnormal fluid collection. Aortic atherosclerosis. IMPRESSION: 1. OG tube appears adequately positioned in the stomach with proximal sideholes just below the level of the gastroesophageal junction. 2. Nonobstructive bowel gas pattern. 3. Aortic atherosclerosis. Electronically Signed   By: Franki Cabot M.D.   On: 09/20/2019 11:52   Ct Chest Wo Contrast  Result Date: 09/19/2019 CLINICAL DATA:  Midsternal chest pain for 2 weeks with shortness of breath. Nonsmoker without history of cancer. EXAM: CT CHEST WITHOUT CONTRAST TECHNIQUE: Multidetector CT imaging of the chest was performed following the standard protocol without IV contrast. COMPARISON:  09/05/2019 chest radiograph. Most recent CT of 09/02/2008 FINDINGS: Cardiovascular: Advanced aortic and branch vessel atherosclerosis. Tortuous thoracic aorta. Mild cardiomegaly, without pericardial effusion. Multivessel coronary artery atherosclerosis. Aortic valve calcification. Mediastinum/Nodes: Hypoattenuating left thyroid nodule of 1.2 cm, new but  nonspecific. No mediastinal or definite hilar adenopathy, given limitations of unenhanced CT. Lungs/Pleura: No pleural fluid.  Clear lungs. Upper Abdomen: Nonspecific caudate lobe enlargement. Similar. Normal imaged portions of the spleen, adrenal glands. Renal atrophy bilaterally. An upper pole left renal exophytic 1.8 cm cyst. Pancreatic tail 1.8 cm lesion on 114/2 is minimally enlarged compared to 2009 where it measured 1.5 cm. Calcification in its inferior portion. There is also a incompletely imaged pancreatic body lesion on 125/2, on the order of 1.8 cm-similar. Suggestion of peripancreatic inflammation, especially about the pancreatic tail lesion on 113/2. Cholecystectomy. Musculoskeletal: Lower thoracic spondylosis. No sternal fracture or retrosternal fluid. IMPRESSION: 1.  No acute process in the chest. 2. Coronary artery atherosclerosis. Aortic Atherosclerosis (ICD10-I70.0). 3. Pancreatic cystic lesions, suboptimally and incompletely evaluated. Suspicion of peripancreatic edema. Correlate with symptoms to suggest acute on chronic pancreatitis. Consider follow-up with dedicated MRI or CT. 4. Chronic caudate lobe prominence, nonspecific. Correlate with any risk  factors for cirrhosis. Electronically Signed   By: Abigail Miyamoto M.D.   On: 09/19/2019 12:28   Dg Chest Port 1 View  Result Date: 09/24/2019 CLINICAL DATA:  Pulmonary infiltrates. EXAM: PORTABLE CHEST 1 VIEW COMPARISON:  09/22/2019 FINDINGS: Improved appearance of interstitial and heterogeneous opacity seen on prior exams. Cardiomediastinal contours are stable with cardiac enlargement and dense aortic calcification and tortuosity. No signs of effusion or dense consolidation. No acute bone finding. IMPRESSION: 1. Improved appearance of presumed edema, potentially superimposed on chronic lung disease. 2. Stable dense aortic calcification and tortuosity. Electronically Signed   By: Zetta Bills M.D.   On: 09/24/2019 10:41   Dg Chest Port 1  View  Result Date: 09/22/2019 CLINICAL DATA:  Pulmonary disease EXAM: PORTABLE CHEST 1 VIEW COMPARISON:  09/20/2019 FINDINGS: Bilateral diffuse interstitial thickening. No pleural effusion or pneumothorax. No focal consolidation. Stable cardiomediastinal silhouette. Thoracic aortic atherosclerosis. No acute osseous abnormality. IMPRESSION: Diffuse bilateral interstitial thickening likely reflecting chronic interstitial disease with possible superimposed interstitial edema or infection. Electronically Signed   By: Kathreen Devoid   On: 09/22/2019 18:04   Dg Chest Port 1 View  Result Date: 09/20/2019 CLINICAL DATA:  Intubation EXAM: PORTABLE CHEST 1 VIEW COMPARISON:  09/20/2019, 5:50 a.m. FINDINGS: Interval placement of endotracheal tube, tip positioned over the lower trachea, approximately 2 cm above the carina. Esophagogastric tube is position with tip and side port below the diaphragm. Mild cardiomegaly. Interval improvement in bilateral heterogeneous and interstitial opacity, most conspicuous in the right midlung. IMPRESSION: 1. Interval placement of endotracheal tube, tip positioned over the lower trachea, approximately 2 cm above the carina. Esophagogastric tube is position with tip and side port below the diaphragm. 2. Interval improvement in bilateral heterogeneous and interstitial opacity, most conspicuous in the right midlung, likely improved edema. Electronically Signed   By: Eddie Candle M.D.   On: 09/20/2019 11:20   Dg Chest Port 1 View  Result Date: 09/20/2019 CLINICAL DATA:  Intubated. Respiratory distress. EXAM: PORTABLE CHEST 1 VIEW COMPARISON:  09/05/2019 FINDINGS: The endotracheal tube tip is at the thoracic inlet. Borderline enlarged cardiac silhouette. Tortuous and calcified thoracic aorta. Extensive patchy bilateral airspace opacity, greater on the right. No pleural fluid. Thoracic spine degenerative changes. IMPRESSION: 1. Endotracheal tube tip at the thoracic inlet. It is recommended  that this be advanced 4.3 cm. 2. Extensive patchy bilateral probable pneumonia. 3. Aortic atherosclerosis. Electronically Signed   By: Claudie Revering M.D.   On: 09/20/2019 06:41   Dg Chest Port 1 View  Result Date: 09/05/2019 CLINICAL DATA:  Shortness of breath EXAM: PORTABLE CHEST 1 VIEW COMPARISON:  05/23/2019 FINDINGS: Tortuosity of the thoracic aorta with diffuse calcifications. Aorta is normal size. Low lung volumes with mild peribronchial thickening and interstitial prominence. No effusions. No acute bony abnormality. IMPRESSION: Mild peribronchial thickening and interstitial prominence may reflect bronchitic changes. Electronically Signed   By: Rolm Baptise M.D.   On: 09/05/2019 17:12       Subjective: She has no complaints.  No chest pain, dyspnea, confusion, fever, cough, sputum, swelling.  Discharge Exam: Vitals:   09/29/19 0742 09/29/19 0930  BP: (!) 155/47 (!) 155/57  Pulse: 75 80  Resp: 18   Temp: 98.2 F (36.8 C)   SpO2: 97%    Vitals:   09/28/19 2105 09/29/19 0343 09/29/19 0742 09/29/19 0930  BP: (!) 141/44 (!) 146/48 (!) 155/47 (!) 155/57  Pulse: 74 77 75 80  Resp:   18   Temp: 99 F (37.2  C) 98.9 F (37.2 C) 98.2 F (36.8 C)   TempSrc: Oral Oral Oral   SpO2: 98% 96% 97%   Weight:      Height:        General: Pt is alert, awake, not in acute distress, lying in bed, reading Cardiovascular: RRR, nl [id number], systolic murmur noted today.   No LE edema.   Respiratory: Normal respiratory rate and rhythm.  CTAB without rales or wheezes. Abdominal: Abdomen soft and non-tender.  No distension or HSM.   Neuro/Psych: Strength symmetric in upper and lower extremities.  Judgment and insight appear normal.   The results of significant diagnostics from this hospitalization (including imaging, microbiology, ancillary and laboratory) are listed below for reference.     Microbiology: Recent Results (from the past 240 hour(s))  Culture, blood (routine x 2)     Status: None    Collection Time: 09/20/19  5:26 AM   Specimen: BLOOD  Result Value Ref Range Status   Specimen Description BLOOD RIGHT FOREARM  Final   Special Requests   Final    BOTTLES DRAWN AEROBIC AND ANAEROBIC Blood Culture adequate volume   Culture   Final    NO GROWTH 5 DAYS Performed at [facility], [address] [place], [place] [address]    Report Status 09/25/2019 FINAL  Final  Culture, blood (routine x 2)     Status: None   Collection Time: 09/20/19  5:26 AM   Specimen: BLOOD  Result Value Ref Range Status   Specimen Description BLOOD RIGHT HAND  Final   Special Requests   Final    BOTTLES DRAWN AEROBIC AND ANAEROBIC Blood Culture adequate volume   Culture   Final    NO GROWTH 5 DAYS Performed at [facility], [address] [place],  [address]    Report Status 09/25/2019 FINAL  Final  SARS Coronavirus 2 by RT PCR (hospital order, performed in [place] hospital lab) Nasopharyngeal Nasopharyngeal Swab     Status: None   Collection Time: 09/20/19  5:26 AM   Specimen: Nasopharyngeal Swab  Result Value Ref Range Status   SARS Coronavirus 2 NEGATIVE NEGATIVE Final    Comment: (NOTE) If result is NEGATIVE SARS-CoV-2 target nucleic acids are NOT DETECTED. The SARS-CoV-2 RNA is generally detectable in upper and lower  respiratory specimens during the acute phase of infection. The lowest  concentration of SARS-CoV-2 viral copies this assay can detect is 250  copies / mL. A negative result does not preclude SARS-CoV-2 infection  and should not be used as the sole basis for treatment or other  patient management decisions.  A negative result may occur with  improper specimen collection / handling, submission of specimen other  than nasopharyngeal swab, presence of viral mutation(s) within the  areas targeted by this assay, and inadequate number of viral copies  (<250 copies / mL). A negative result must be combined with clinical  observations, patient  history, and epidemiological information. If result is POSITIVE SARS-CoV-2 target nucleic acids are DETECTED. The SARS-CoV-2 RNA is generally detectable in upper and lower  respiratory specimens dur ing the acute phase of infection.  Positive  results are indicative of active infection with SARS-CoV-2.  Clinical  correlation with patient history and other diagnostic information is  necessary to determine patient infection status.  Positive results do  not rule out bacterial infection or co-infection with other viruses. If result is PRESUMPTIVE POSTIVE SARS-CoV-2 nucleic acids MAY BE PRESENT.   A  presumptive positive result was obtained on the submitted specimen  and confirmed on repeat testing.  While 2019 novel coronavirus  (SARS-CoV-2) nucleic acids may be present in the submitted sample  additional confirmatory testing may be necessary for epidemiological  and / or clinical management purposes  to differentiate between  SARS-CoV-2 and other Sarbecovirus currently known to infect humans.  If clinically indicated additional testing with an alternate test  methodology [phone]) is advised. The SARS-CoV-2 RNA is generally  detectable in upper and lower respiratory sp ecimens during the acute  phase of infection. The expected result is Negative. Fact Sheet for Patients:  StrictlyIdeas.no Fact Sheet for Healthcare Providers: BankingDealers.co.za This test is not yet approved or cleared by the [place] FDA and has been authorized for detection and/or diagnosis of SARS-CoV-2 by FDA under an Emergency Use Authorization (EUA).  This EUA will remain in effect (meaning this test can be used) for the duration of the COVID-19 declaration under Section 564(b)(1) of the Act, 21 U.S.C. section 360bbb-3(b)(1), unless the authorization is terminated or revoked sooner. Performed at [facility], [address] [place], [place]  [address]   MRSA PCR Screening     Status: None   Collection Time: 09/20/19 10:12 AM   Specimen: Nasopharyngeal  Result Value Ref Range Status   MRSA by PCR NEGATIVE NEGATIVE Final    Comment:        The GeneXpert MRSA Assay (FDA approved for NASAL specimens only), is one component of a comprehensive MRSA colonization surveillance program. It is not intended to diagnose MRSA infection nor to guide or monitor treatment for MRSA infections. Performed at [facility], [address] [place], [place] [address]   CULTURE, BLOOD (ROUTINE X 2) w Reflex to ID Panel     Status: None (Preliminary result)   Collection Time: 09/22/19  6:30 PM   Specimen: BLOOD  Result Value Ref Range Status   Specimen Description BLOOD BLOOD LEFT HAND  Final   Special Requests   Final    BOTTLES DRAWN AEROBIC ONLY Blood Culture results may not be optimal due to an inadequate volume of blood received in culture bottles   Culture   Final    NO GROWTH 3 DAYS Performed at [facility], [address] [place], [place] [address]    Report Status PENDING  Incomplete  CULTURE, BLOOD (ROUTINE X 2) w Reflex to ID Panel     Status: None (Preliminary result)   Collection Time: 09/22/19  6:53 PM   Specimen: BLOOD  Result Value Ref Range Status   Specimen Description BLOOD BLOOD RIGHT HAND  Final   Special Requests   Final    BOTTLES DRAWN AEROBIC AND ANAEROBIC Blood Culture adequate volume   Culture   Final    NO GROWTH 3 DAYS Performed at [facility], [address] [place], [place] [address]    Report Status PENDING  Incomplete     Labs: BNP (last 3 results) Recent Labs    05/23/19 2300 09/05/19 1651 09/20/19 0526  BNP 895.0* 979.0* [id number]*   Basic Metabolic Panel: Recent Labs  Lab 09/23/19 0346 09/24/19 0346 09/25/19 0547 09/25/19 1337 09/26/19 0335 09/27/19 0443 09/27/19 1919 09/28/19 0504 09/28/19 1807 09/29/19 0830  NA 143 140 139  --  138  --   --  139   --  136  K 3.7 3.5 3.7  --  3.0*  --   --  4.1  --  4.3  CL 99 97* 96*  --  99  --   --  99  --  97*  CO2 28 31 28   --  29  --   --  31  --  27  GLUCOSE 122* 101* 102*  --  97  --   --  99  --  110*  BUN 33* 14 29*  --  20  --   --  16  --  26*  CREATININE 4.57* 2.54* 4.26*  --  2.75* 4.37*  --  2.62*  --  4.49*  CALCIUM 7.6* 8.2* 8.2*  --  7.6*  --   --  8.2*  --  8.6*  MG 1.9  --  1.9  --  1.6*  --   --  2.1  --   --   PHOS  --   --   --  1.4* 3.7  --  1.2*  --  3.1  --    Liver Function Tests: No results for input(s): AST, ALT, ALKPHOS, BILITOT, PROT, ALBUMIN in the last 168 hours. No results for input(s): LIPASE, AMYLASE in the last 168 hours. No results for input(s): AMMONIA in the last 168 hours. CBC: Recent Labs  Lab 09/22/19 1645 09/23/19 0346 09/24/19 0346 09/25/19 0547 09/26/19 0335  WBC 9.1 7.2 6.0 5.3 4.4  NEUTROABS  --   --   --  4.2 3.1  HGB 8.5* 7.6* 8.6* 8.6* 8.2*  HCT 26.0* 23.6* 26.4* 26.5* 25.7*  MCV 89.7 89.4 89.2 90.1 90.2  PLT 108* 105* 106* 108* 119*   Cardiac Enzymes: No results for input(s): CKTOTAL, CKMB, CKMBINDEX, TROPONINI in the last 168 hours. BNP: Invalid input(s): POCBNP CBG: Recent Labs  Lab 09/28/19 1158 09/28/19 1740 09/28/19 2242 09/29/19 0742 09/29/19 1230  GLUCAP 113* 93 98 83 120*   D-Dimer No results for input(s): DDIMER in the last 72 hours. Hgb A1c No results for input(s): HGBA1C in the last 72 hours. Lipid Profile No results for input(s): CHOL, HDL, LDLCALC, TRIG, CHOLHDL, LDLDIRECT in the last 72 hours. Thyroid function studies No results for input(s): TSH, T4TOTAL, T3FREE, THYROIDAB in the last 72 hours.  Invalid input(s): FREET3 Anemia work up No results for input(s): VITAMINB12, FOLATE, FERRITIN, TIBC, IRON, RETICCTPCT in the last 72 hours. Urinalysis    Component Value Date/Time   COLORURINE YELLOW (A) 11/28/2018 2146   APPEARANCEUR CLEAR (A) 11/28/2018 2146   LABSPEC 1.012 11/28/2018 2146   PHURINE 8.0  11/28/2018 2146   GLUCOSEU 50 (A) 11/28/2018 2146   HGBUR NEGATIVE 11/28/2018 2146   BILIRUBINUR NEGATIVE 11/28/2018 2146   KETONESUR 5 (A) 11/28/2018 2146   PROTEINUR >=300 (A) 11/28/2018 2146   NITRITE NEGATIVE 11/28/2018 2146   LEUKOCYTESUR NEGATIVE 11/28/2018 2146   Sepsis Labs Invalid input(s): PROCALCITONIN,  WBC,  LACTICIDVEN Microbiology Recent Results (from the past 240 hour(s))  Culture, blood (routine x 2)     Status: None   Collection Time: 09/20/19  5:26 AM   Specimen: BLOOD  Result Value Ref [place] Status   Specimen Description BLOOD RIGHT FOREARM  Final   Special Requests   Final    BOTTLES DRAWN AEROBIC AND ANAEROBIC Blood Culture adequate volume   Culture   Final    NO GROWTH 5 DAYS Performed at [facility], [address], [place], [place] [address]    Report Status 09/25/2019 FINAL  Final  Culture, blood (routine x 2)     Status: None   Collection Time: 09/20/19  5:26 AM   Specimen: BLOOD  Result Value Ref [place] Status   Specimen Description BLOOD RIGHT HAND  Final   Special Requests   Final    BOTTLES DRAWN AEROBIC AND ANAEROBIC Blood Culture adequate volume   Culture   Final    NO GROWTH 5 DAYS Performed at [facility], [address], [place], [place] [address]    Report Status 09/25/2019 FINAL  Final  SARS Coronavirus 2 by RT PCR (hospital order, performed in [facility] hospital lab) Nasopharyngeal Nasopharyngeal Swab     Status: None   Collection Time: 09/20/19  5:26 AM   Specimen: Nasopharyngeal Swab  Result Value Ref Range Status   SARS Coronavirus 2 NEGATIVE NEGATIVE Final    Comment: (NOTE) If result is NEGATIVE SARS-CoV-2 target nucleic acids are NOT DETECTED. The SARS-CoV-2 RNA is generally detectable in upper and lower  respiratory specimens during the acute phase of infection. The lowest  concentration of SARS-CoV-2 viral copies this assay can detect is 250  copies / mL. A negative result does not preclude  SARS-CoV-2 infection  and should not be used as the sole basis for treatment or other  patient management decisions.  A negative result may occur with  improper specimen collection / handling, submission of specimen other  than nasopharyngeal swab, presence of viral mutation(s) within the  areas targeted by this assay, and inadequate number of viral copies  (<250 copies / mL). A negative result must be combined with clinical  observations, patient history, and epidemiological information. If result is POSITIVE SARS-CoV-2 target nucleic acids are DETECTED. The SARS-CoV-2 RNA is generally detectable in upper and lower  respiratory specimens dur ing the acute phase of infection.  Positive  results are indicative of active infection with SARS-CoV-2.  Clinical  correlation with patient history and other diagnostic information is  necessary to determine patient infection status.  Positive results do  not rule out bacterial infection or co-infection with other viruses. If result is PRESUMPTIVE POSTIVE SARS-CoV-2 nucleic acids MAY BE PRESENT.   A presumptive positive result was obtained on the submitted specimen  and confirmed on repeat testing.  While 2019 novel coronavirus  (SARS-CoV-2) nucleic acids may be present in the submitted sample  additional confirmatory testing may be necessary for epidemiological  and / or clinical management purposes  to differentiate between  SARS-CoV-2 and other Sarbecovirus currently known to infect humans.  If clinically indicated additional testing with an alternate test  methodology [phone]) is advised. The SARS-CoV-2 RNA is generally  detectable in upper and lower respiratory sp ecimens during the acute  phase of infection. The expected result is Negative. Fact Sheet for Patients:  StrictlyIdeas.no Fact Sheet for Healthcare Providers: BankingDealers.co.za This test is not yet approved or cleared by the  [place] FDA and has been authorized for detection and/or diagnosis of SARS-CoV-2 by FDA under an Emergency Use Authorization (EUA).  This EUA will remain in effect (meaning this test can be used) for the duration of the COVID-19 declaration under Section 564(b)(1) of the Act, 21 U.S.C. section 360bbb-3(b)(1), unless the authorization is terminated or revoked sooner. Performed at [facility], [address] [place], [place] [address]   MRSA PCR Screening     Status: None   Collection Time: 09/20/19 10:12 AM   Specimen: Nasopharyngeal  Result Value Ref Range Status   MRSA by PCR NEGATIVE NEGATIVE Final    Comment:        The GeneXpert MRSA Assay (  FDA approved for NASAL specimens only), is one component of a comprehensive MRSA colonization surveillance program. It is not intended to diagnose MRSA infection nor to guide or monitor treatment for MRSA infections. Performed at [facility], [address] [place], [place] [address]   CULTURE, BLOOD (ROUTINE X 2) w Reflex to ID Panel     Status: None (Preliminary result)   Collection Time: 09/22/19  6:30 PM   Specimen: BLOOD  Result Value Ref Range Status   Specimen Description BLOOD BLOOD LEFT HAND  Final   Special Requests   Final    BOTTLES DRAWN AEROBIC ONLY Blood Culture results may not be optimal due to an inadequate volume of blood received in culture bottles   Culture   Final    NO GROWTH 3 DAYS Performed at [facility], [address], [place], [place] [address]    Report Status PENDING  Incomplete  CULTURE, BLOOD (ROUTINE X 2) w Reflex to ID Panel     Status: None (Preliminary result)   Collection Time: 09/22/19  6:53 PM   Specimen: BLOOD  Result Value Ref Range Status   Specimen Description BLOOD BLOOD RIGHT HAND  Final   Special Requests   Final    BOTTLES DRAWN AEROBIC AND ANAEROBIC Blood Culture adequate volume   Culture   Final    NO GROWTH 3 DAYS Performed at [facility], [address] [place], [place] [address]    Report Status PENDING  Incomplete     Time coordinating discharge: 40 minutes The [place] controlled substances registry was reviewed for this patient prior to filling the <5 days supply controlled substances script.      SIGNED:   Edwin Dada, MD  [facility] 09/29/2019, 2:32 PM

## 2019-09-29 NOTE — Progress Notes (Signed)
Pt refused to get OOB to chair at this time. States she will get up after lunch and after pain in her chest revolves. Pain medication giving at this time.

## 2019-09-29 NOTE — Progress Notes (Signed)
PHARMACIST - PHYSICIAN COMMUNICATION  CONCERNING: IV to Oral Route Change Policy  RECOMMENDATION: This patient is receiving Protonix by the intravenous route.  Based on criteria approved by the Pharmacy and Therapeutics Committee, the intravenous medication(s) is/are being converted to the equivalent oral dose form(s).   DESCRIPTION: These criteria include:  The patient is eating (either orally or via tube) and/or has been taking other orally administered medications for a least 24 hours  The patient has no evidence of active gastrointestinal bleeding or impaired GI absorption (gastrectomy, short bowel, patient on TNA or NPO).  Pernell Dupre, PharmD, BCPS Clinical Pharmacist 09/29/2019 8:11 AM

## 2019-09-30 LAB — CBC
HCT: 24.9 % — ABNORMAL LOW (ref 36.0–46.0)
Hemoglobin: 8.3 g/dL — ABNORMAL LOW (ref 12.0–15.0)
MCH: 29.6 pg (ref 26.0–34.0)
MCHC: 33.3 g/dL (ref 30.0–36.0)
MCV: 88.9 fL (ref 80.0–100.0)
Platelets: 170 10*3/uL (ref 150–400)
RBC: 2.8 MIL/uL — ABNORMAL LOW (ref 3.87–5.11)
RDW: 14.7 % (ref 11.5–15.5)
WBC: 4 10*3/uL (ref 4.0–10.5)
nRBC: 0 % (ref 0.0–0.2)

## 2019-09-30 LAB — CULTURE, BLOOD (ROUTINE X 2)
Culture: NO GROWTH
Culture: NO GROWTH
Special Requests: ADEQUATE

## 2019-09-30 LAB — RENAL FUNCTION PANEL
Albumin: 3.6 g/dL (ref 3.5–5.0)
Anion gap: 12 (ref 5–15)
BUN: 38 mg/dL — ABNORMAL HIGH (ref 8–23)
CO2: 27 mmol/L (ref 22–32)
Calcium: 8.6 mg/dL — ABNORMAL LOW (ref 8.9–10.3)
Chloride: 95 mmol/L — ABNORMAL LOW (ref 98–111)
Creatinine, Ser: 5.91 mg/dL — ABNORMAL HIGH (ref 0.44–1.00)
GFR calc Af Amer: 8 mL/min — ABNORMAL LOW (ref >60)
GFR calc non Af Amer: 7 mL/min — ABNORMAL LOW (ref >60)
Glucose, Bld: 103 mg/dL — ABNORMAL HIGH (ref 70–99)
Phosphorus: 5.4 mg/dL — ABNORMAL HIGH (ref 2.5–4.6)
Potassium: 4.8 mmol/L (ref 3.5–5.1)
Sodium: 134 mmol/L — ABNORMAL LOW (ref 135–145)

## 2019-09-30 LAB — GLUCOSE, CAPILLARY
Glucose-Capillary: 94 mg/dL (ref 70–99)
Glucose-Capillary: 89 mg/dL (ref 70–99)

## 2019-09-30 NOTE — Discharge Summary (Signed)
Physician Discharge Summary  Phyllis [name] [id number] DOB: Mar 24, 1946 DOA: 09/20/2019  PCP: [name], MD  Admit date: 09/20/2019 Discharge date: 09/30/2019  Admitted From: Home  Disposition:  SNF   Recommendations for Outpatient Follow-up:  1. Follow up with PCP in 1-2 weeks after discharge from SNF 2. Follow up with Cardiology re: Aortic stenosis as soon as able      Home Health: TBD at SNF  Equipment/Devices: TBD at SNF  Discharge Condition: Fair  CODE STATUS: FULL Diet recommendation: Diabetic, cardiac, renal  Brief/Interim Summary: Phyllis [name] is a 73 y.o. F with ESRD on HD TThS, dCHF, AoCKD, HTN, PVD carotids, DM and moderate AS and asthma who presented with respiratory distress, found to have SpO2 60% by EMS.   In ER, arrived on CPAP, required intubation.  CXR showed edema.    Discharge delayed one day due to insurance authorization.      PRINCIPAL HOSPITAL DIAGNOSIS: Acute respiratory failure due to flash pulmonary edema    Discharge Diagnoses:   Acute hypoxic respiratory failure from flash pulmonary edema Acute on chronic diastolic CHF in setting of severe AS Patient admitted with flash pulmonary edema requiring intubation.  Dialyzed and fluid status corrected, able to be extubated.  Given her advanced aortic stenosis and concomitant dialysis-dependence, she has a tenuous and challenging fluid balance.  It seems likely that she may have frequent episodes like this if her valve cannot be repaired, and her prognosis is guarded.  Lasix was stopped and she was started on torsemide on off dialysis days, and she remained clinically stable.   Possible pneumonia Started on empiric antibiotics at admission.  These were stopped after 1 dose, then developed fever and procal >3 and antibiotics were restarted.  Remains afebrile, without cough or sputum. Has finished Zithromax 5 days, and 7 days beta-lactam.  ESRD Dialysis Tuesday,  Thursday, Saturday  Hypertension Peripheral vascular disease secondary prevention   Diabetes  Anemia of CKD  OSA not on CPAP at home  Thrombocytoepnia             Discharge Instructions  Discharge Instructions    Discharge instructions   Complete by: As directed    From Dr. Loleta Books: You were admitted for fluid overload. You got dialysis and this got better. We weren't sure if you had a pneumonia on top of it, so we treated you with antibiotics. You completed the course of antibiotics.   We have made two small changes to your medicines: Stop Lasix and start torsemide (this is an alternate diuretic) Reduce your dose of hydralazine  Follow up with Dr. Rockey Situ Follow up with your primary care doctor asap after you get out of rehab   Increase activity slowly   Complete by: As directed      Allergies as of 09/30/2019      Reactions   Enalapril Maleate Other (See Comments)   Other reaction(s): Headache   Nitrofurantoin Swelling, Rash   Other Reaction: swelling of body   Sulfamethoxazole-trimethoprim Swelling   2,4-d Dimethylamine (amisol) Rash, Other (See Comments)   Other Reaction: h/a   Baclofen Other (See Comments), Nausea Only   lightheadness ,drowsiness , muscle weakness , twitching in hands    Neosporin [neomycin-bacitracin Zn-polymyx] Other (See Comments), Rash   Other Reaction: irritation Skin irritation   Quinine Nausea And Vomiting, Rash, Other (See Comments)   Other Reaction: Vomiting, rash, h/a, vision   Ultram [tramadol] Palpitations   Zocor [simvastatin] Other (See Comments), Rash   Other Reaction:  muscle spasms Muscle pain and spasms   Bactrim [sulfamethoxazole-trimethoprim] Swelling   Levodopa Other (See Comments)   Reaction: unknown   Macrodantin [nitrofurantoin Macrocrystal] Swelling   Quinine Derivatives Other (See Comments)   Vertigo,nausea vomiting blurred vision headache ears sensitivity      Medication List    STOP  taking these medications   furosemide 80 MG tablet Commonly known as: LASIX     TAKE these medications   acetaminophen 500 MG tablet Commonly known as: TYLENOL Take 1 tablet (500 mg total) by mouth every 6 (six) hours as needed for mild pain or fever.   albuterol 108 (90 Base) MCG/ACT inhaler Commonly known as: VENTOLIN HFA Inhale 2 puffs into the lungs every 6 (six) hours as needed for wheezing or shortness of breath. What changed: Another medication with the same name was removed. Continue taking this medication, and follow the directions you see here.   amLODipine 10 MG tablet Commonly known as: NORVASC Take 10 mg by mouth daily.   aspirin 81 MG chewable tablet Chew 1 tablet (81 mg total) by mouth daily.   calcium acetate 667 MG capsule Commonly known as: PHOSLO Take 1,334 mg by mouth 3 (three) times daily with meals.   carvedilol 12.5 MG tablet Commonly known as: COREG Take 12.5 mg by mouth 2 (two) times daily with a meal.   cetirizine 10 MG tablet Commonly known as: ZYRTEC Take 10 mg by mouth daily as needed for allergies.   clopidogrel 75 MG tablet Commonly known as: PLAVIX Take 1 tablet (75 mg total) by mouth daily.   Fluticasone-Salmeterol 250-50 MCG/DOSE Aepb Commonly known as: ADVAIR Inhale 1 puff into the lungs 2 (two) times daily as needed (for shortness of breath).   hydrALAZINE 50 MG tablet Commonly known as: APRESOLINE Take 1 tablet (50 mg total) by mouth every 8 (eight) hours. What changed:   medication strength  how much to take  when to take this   HYDROcodone-acetaminophen 7.5-325 MG tablet Commonly known as: NORCO Take 1-2 tablets by mouth daily as needed (pain).   lidocaine-prilocaine cream Commonly known as: EMLA Apply 1 application topically as needed (prior to treatment).   losartan 50 MG tablet Commonly known as: COZAAR Take 50 mg by mouth daily.   omeprazole 20 MG capsule Commonly known as: PRILOSEC Take 1 capsule (20 mg  total) by mouth 2 (two) times daily before a meal.   ondansetron 4 MG tablet Commonly known as: ZOFRAN Take 4 mg by mouth every 8 (eight) hours as needed.   oxybutynin 15 MG 24 hr tablet Commonly known as: DITROPAN XL Take 15 mg by mouth daily.   pravastatin 40 MG tablet Commonly known as: PRAVACHOL Take 40 mg by mouth at bedtime.   topiramate 25 MG tablet Commonly known as: TOPAMAX Take 1 tablet (25 mg total) by mouth as needed (for headaches).   torsemide 20 MG tablet Commonly known as: DEMADEX Take 2 tablets (40 mg total) by mouth every Monday,Wednesday,Friday, and Sunday at 6 PM. Start taking on: October 01, 2019       Contact information for follow-up providers    Rise Mu, PA-C. Go on 10/07/2019.   Specialties: Physician Assistant, Cardiology, Radiology Why: @9 :30 AM  Contact information: [place] [address] [phone]        [name], MD. Go on 10/20/2019.   Specialty: Internal Medicine Why: @2 :30 PM Contact information: [address] [place] [address]  [phone]            Contact information for after-discharge care    [facility] .   Service: Skilled Nursing Contact information: [place] [phone]                 Allergies  Allergen Reactions  . Enalapril Maleate Other (See Comments)    Other reaction(s): Headache  . Nitrofurantoin Swelling and Rash    Other Reaction: swelling of body  . Sulfamethoxazole-Trimethoprim Swelling  . 2,4-D Dimethylamine (Amisol) Rash and Other (See Comments)    Other Reaction: h/a  . Baclofen Other (See Comments) and Nausea Only    lightheadness ,drowsiness , muscle weakness , twitching in hands   . Neosporin [Neomycin-Bacitracin Zn-Polymyx] Other (See Comments) and Rash    Other Reaction: irritation Skin irritation   . Quinine  Nausea And Vomiting, Rash and Other (See Comments)    Other Reaction: Vomiting, rash, h/a, vision  . Ultram [Tramadol] Palpitations  . Zocor [Simvastatin] Other (See Comments) and Rash    Other Reaction: muscle spasms Muscle pain and spasms  . Bactrim [Sulfamethoxazole-Trimethoprim] Swelling  . Levodopa Other (See Comments)    Reaction: unknown  . Macrodantin [Nitrofurantoin Macrocrystal] Swelling  . Quinine Derivatives Other (See Comments)    Vertigo,nausea vomiting blurred vision headache ears sensitivity     Consultations:  Cardiology  Nephrology  Critical care   Procedures/Studies: Dg Abd 1 View  Result Date: 09/20/2019 CLINICAL DATA:  OG tube placement EXAM: ABDOMEN - 1 VIEW COMPARISON:  None. FINDINGS: OG tube appears adequately positioned in the stomach with proximal sideholes just below the level of the gastroesophageal junction. Visualized bowel gas pattern is nonobstructive. No evidence of free intraperitoneal air or abnormal fluid collection. Aortic atherosclerosis. IMPRESSION: 1. OG tube appears adequately positioned in the stomach with proximal sideholes just below the level of the gastroesophageal junction. 2. Nonobstructive bowel gas pattern. 3. Aortic atherosclerosis. Electronically Signed   By: Franki Cabot M.D.   On: 09/20/2019 11:52   Ct Chest Wo Contrast  Result Date: 09/19/2019 CLINICAL DATA:  Midsternal chest pain for 2 weeks with shortness of breath. Nonsmoker without history of cancer. EXAM: CT CHEST WITHOUT CONTRAST TECHNIQUE: Multidetector CT imaging of the chest was performed following the standard protocol without IV contrast. COMPARISON:  09/05/2019 chest radiograph. Most recent CT of 09/02/2008 FINDINGS: Cardiovascular: Advanced aortic and branch vessel atherosclerosis. Tortuous thoracic aorta. Mild cardiomegaly, without pericardial effusion. Multivessel coronary artery atherosclerosis. Aortic valve calcification. Mediastinum/Nodes: Hypoattenuating left  thyroid nodule of 1.2 cm, new but nonspecific. No mediastinal or definite hilar adenopathy, given limitations of unenhanced CT. Lungs/Pleura: No pleural fluid.  Clear lungs. Upper Abdomen: Nonspecific caudate lobe enlargement. Similar. Normal imaged portions of the spleen, adrenal glands. Renal atrophy bilaterally. An upper pole left renal exophytic 1.8 cm cyst. Pancreatic tail 1.8 cm lesion on 114/2 is minimally enlarged compared to 2009 where it measured 1.5 cm. Calcification in its inferior portion. There is also a incompletely imaged pancreatic body lesion on 125/2, on the order of 1.8 cm-similar. Suggestion of peripancreatic inflammation, especially about the pancreatic tail lesion on 113/2. Cholecystectomy. Musculoskeletal: Lower thoracic spondylosis. No sternal fracture or retrosternal fluid. IMPRESSION: 1.  No acute process in the chest. 2. Coronary artery atherosclerosis. Aortic Atherosclerosis (ICD10-I70.0). 3. Pancreatic cystic lesions, suboptimally and incompletely evaluated. Suspicion of peripancreatic edema. Correlate with symptoms to suggest acute on chronic pancreatitis. Consider follow-up with dedicated MRI  or CT. 4. Chronic caudate lobe prominence, nonspecific. Correlate with any risk factors for cirrhosis. Electronically Signed   By: Abigail Miyamoto M.D.   On: 09/19/2019 12:28   Dg Chest Port 1 View  Result Date: 09/24/2019 CLINICAL DATA:  Pulmonary infiltrates. EXAM: PORTABLE CHEST 1 VIEW COMPARISON:  09/22/2019 FINDINGS: Improved appearance of interstitial and heterogeneous opacity seen on prior exams. Cardiomediastinal contours are stable with cardiac enlargement and dense aortic calcification and tortuosity. No signs of effusion or dense consolidation. No acute bone finding. IMPRESSION: 1. Improved appearance of presumed edema, potentially superimposed on chronic lung disease. 2. Stable dense aortic calcification and tortuosity. Electronically Signed   By: Zetta Bills M.D.   On: 09/24/2019  10:41   Dg Chest Port 1 View  Result Date: 09/22/2019 CLINICAL DATA:  Pulmonary disease EXAM: PORTABLE CHEST 1 VIEW COMPARISON:  09/20/2019 FINDINGS: Bilateral diffuse interstitial thickening. No pleural effusion or pneumothorax. No focal consolidation. Stable cardiomediastinal silhouette. Thoracic aortic atherosclerosis. No acute osseous abnormality. IMPRESSION: Diffuse bilateral interstitial thickening likely reflecting chronic interstitial disease with possible superimposed interstitial edema or infection. Electronically Signed   By: Kathreen Devoid   On: 09/22/2019 18:04   Dg Chest Port 1 View  Result Date: 09/20/2019 CLINICAL DATA:  Intubation EXAM: PORTABLE CHEST 1 VIEW COMPARISON:  09/20/2019, 5:50 a.m. FINDINGS: Interval placement of endotracheal tube, tip positioned over the lower trachea, approximately 2 cm above the carina. Esophagogastric tube is position with tip and side port below the diaphragm. Mild cardiomegaly. Interval improvement in bilateral heterogeneous and interstitial opacity, most conspicuous in the right midlung. IMPRESSION: 1. Interval placement of endotracheal tube, tip positioned over the lower trachea, approximately 2 cm above the carina. Esophagogastric tube is position with tip and side port below the diaphragm. 2. Interval improvement in bilateral heterogeneous and interstitial opacity, most conspicuous in the right midlung, likely improved edema. Electronically Signed   By: Eddie Candle M.D.   On: 09/20/2019 11:20   Dg Chest Port 1 View  Result Date: 09/20/2019 CLINICAL DATA:  Intubated. Respiratory distress. EXAM: PORTABLE CHEST 1 VIEW COMPARISON:  09/05/2019 FINDINGS: The endotracheal tube tip is at the thoracic inlet. Borderline enlarged cardiac silhouette. Tortuous and calcified thoracic aorta. Extensive patchy bilateral airspace opacity, greater on the right. No pleural fluid. Thoracic spine degenerative changes. IMPRESSION: 1. Endotracheal tube tip at the thoracic  inlet. It is recommended that this be advanced 4.3 cm. 2. Extensive patchy bilateral probable pneumonia. 3. Aortic atherosclerosis. Electronically Signed   By: Claudie Revering M.D.   On: 09/20/2019 06:41   Dg Chest Port 1 View  Result Date: 09/05/2019 CLINICAL DATA:  Shortness of breath EXAM: PORTABLE CHEST 1 VIEW COMPARISON:  05/23/2019 FINDINGS: Tortuosity of the thoracic aorta with diffuse calcifications. Aorta is normal size. Low lung volumes with mild peribronchial thickening and interstitial prominence. No effusions. No acute bony abnormality. IMPRESSION: Mild peribronchial thickening and interstitial prominence may reflect bronchitic changes. Electronically Signed   By: Rolm Baptise M.D.   On: 09/05/2019 17:12      Subjective: She has no complaints.  No chest pain, dyspnea, confusion, fever, cough, sputum, swelling.  Discharge Exam: Vitals:   09/30/19 1134 09/30/19 1204  BP: (!) 143/56 (!) 148/83  Pulse: 81 86  Resp: 15 18  Temp:  98.4 F (36.9 C)  SpO2:  97%   Vitals:   09/30/19 1115 09/30/19 1130 09/30/19 1134 09/30/19 1204  BP: (!) 138/44 (!) 127/58 (!) 143/56 (!) 148/83  Pulse: 79 78 81 86  Resp: 20 16 15 18   Temp:  97.7 F (36.5 C)  98.4 F (36.9 C)  TempSrc:  Oral  Oral  SpO2:  99%  97%  Weight:  63 kg    Height:        General: Pt lying in bed, interactive, no acute distress, elderly female Cardiovascular: RRR, systolic murmur, no LE edema  Respiratory: RR normal, no rales or wheezes. Abdominal: Abd soft and nontender, no distension Neuro/Psych: Strength globally somewhat weak but symmetric. [place] normal, judgement and insight good.   The results of significant diagnostics from this hospitalization (including imaging, microbiology, ancillary and laboratory) are listed below for reference.     Microbiology: Recent Results (from the past 240 hour(s))  CULTURE, BLOOD (ROUTINE X 2) w Reflex to ID Panel     Status: None   Collection Time: 09/22/19  6:30 PM    Specimen: BLOOD  Result Value Ref Range Status   Specimen Description BLOOD BLOOD LEFT HAND  Final   Special Requests   Final    BOTTLES DRAWN AEROBIC ONLY Blood Culture results may not be optimal due to an inadequate volume of blood received in culture bottles   Culture   Final    NO GROWTH 8 DAYS Performed at [facility], [address] [place], [place] [address]    Report Status 09/30/2019 FINAL  Final  CULTURE, BLOOD (ROUTINE X 2) w Reflex to ID Panel     Status: None   Collection Time: 09/22/19  6:53 PM   Specimen: BLOOD  Result Value Ref Range Status   Specimen Description BLOOD BLOOD RIGHT HAND  Final   Special Requests   Final    BOTTLES DRAWN AEROBIC AND ANAEROBIC Blood Culture adequate volume   Culture   Final    NO GROWTH 8 DAYS Performed at [facility], [address] [place], [place] [address]    Report Status 09/30/2019 FINAL  Final     Labs: BNP (last 3 results) Recent Labs    05/23/19 2300 09/05/19 1651 09/20/19 0526  BNP 895.0* 979.0* [id number]*   Basic Metabolic Panel: Recent Labs  Lab 09/25/19 0547 09/25/19 1337 09/26/19 0335 09/27/19 0443 09/27/19 1919 09/28/19 0504 09/28/19 1807 09/29/19 0830 09/30/19 0839  NA 139  --  138  --   --  139  --  136 134*  K 3.7  --  3.0*  --   --  4.1  --  4.3 4.8  CL 96*  --  99  --   --  99  --  97* 95*  CO2 28  --  29  --   --  31  --  27 27  GLUCOSE 102*  --  97  --   --  99  --  110* 103*  BUN 29*  --  20  --   --  16  --  26* 38*  CREATININE 4.26*  --  2.75* 4.37*  --  2.62*  --  4.49* 5.91*  CALCIUM 8.2*  --  7.6*  --   --  8.2*  --  8.6* 8.6*  MG 1.9  --  1.6*  --   --  2.1  --   --   --   PHOS  --  1.4* 3.7  --  1.2*  --  3.1  --  5.4*   Liver Function Tests: Recent Labs  Lab 09/30/19 0839  ALBUMIN 3.6   No results for input(s): LIPASE, AMYLASE  in the last 168 hours. No results for input(s): AMMONIA in the last 168 hours. CBC: Recent Labs  Lab 09/24/19 0346  09/25/19 0547 09/26/19 0335 09/30/19 0839  WBC 6.0 5.3 4.4 4.0  NEUTROABS  --  4.2 3.1  --   HGB 8.6* 8.6* 8.2* 8.3*  HCT 26.4* 26.5* 25.7* 24.9*  MCV 89.2 90.1 90.2 88.9  PLT 106* 108* 119* 170   Cardiac Enzymes: No results for input(s): CKTOTAL, CKMB, CKMBINDEX, TROPONINI in the last 168 hours. BNP: Invalid input(s): POCBNP CBG: Recent Labs  Lab 09/29/19 1230 09/29/19 1802 09/29/19 2114 09/30/19 0820 09/30/19 1157  GLUCAP 120* 109* 127* 94 89   D-Dimer No results for input(s): DDIMER in the last 72 hours. Hgb A1c No results for input(s): HGBA1C in the last 72 hours. Lipid Profile No results for input(s): CHOL, HDL, LDLCALC, TRIG, CHOLHDL, LDLDIRECT in the last 72 hours. Thyroid function studies No results for input(s): TSH, T4TOTAL, T3FREE, THYROIDAB in the last 72 hours.  Invalid input(s): FREET3 Anemia work up No results for input(s): VITAMINB12, FOLATE, FERRITIN, TIBC, IRON, RETICCTPCT in the last 72 hours. Urinalysis    Component Value Date/Time   COLORURINE YELLOW (A) 11/28/2018 2146   APPEARANCEUR CLEAR (A) 11/28/2018 2146   LABSPEC 1.012 11/28/2018 2146   PHURINE 8.0 11/28/2018 2146   GLUCOSEU 50 (A) 11/28/2018 2146   HGBUR NEGATIVE 11/28/2018 2146   BILIRUBINUR NEGATIVE 11/28/2018 2146   KETONESUR 5 (A) 11/28/2018 2146   PROTEINUR >=300 (A) 11/28/2018 2146   NITRITE NEGATIVE 11/28/2018 2146   LEUKOCYTESUR NEGATIVE 11/28/2018 2146   Sepsis Labs Invalid input(s): PROCALCITONIN,  WBC,  LACTICIDVEN Microbiology Recent Results (from the past 240 hour(s))  CULTURE, BLOOD (ROUTINE X 2) w Reflex to ID Panel     Status: None   Collection Time: 09/22/19  6:30 PM   Specimen: BLOOD  Result Value Ref Range Status   Specimen Description BLOOD BLOOD LEFT HAND  Final   Special Requests   Final    BOTTLES DRAWN AEROBIC ONLY Blood Culture results may not be optimal due to an inadequate volume of blood received in culture bottles   Culture   Final    NO GROWTH 8  DAYS Performed at [facility], [address] [place], [place] [address]    Report Status 09/30/2019 FINAL  Final  CULTURE, BLOOD (ROUTINE X 2) w Reflex to ID Panel     Status: None   Collection Time: 09/22/19  6:53 PM   Specimen: BLOOD  Result Value Ref Range Status   Specimen Description BLOOD BLOOD RIGHT HAND  Final   Special Requests   Final    BOTTLES DRAWN AEROBIC AND ANAEROBIC Blood Culture adequate volume   Culture   Final    NO GROWTH 8 DAYS Performed at [facility], [address], [place], [place] [address]    Report Status 09/30/2019 FINAL  Final     Time coordinating discharge: 40 minutes The  controlled substances registry was reviewed for this patient prior to filling the <5 days supply controlled substances script.      SIGNED:   Edwin Dada, MD  [facility] 09/30/2019, 12:19 PM

## 2019-09-30 NOTE — Progress Notes (Signed)
HD initiated via L AVF using 15g needles x2. Patient is tearful this am, worried about her friend since she is going to rehab. Given additional blankets. Patient currently on cell phone. Continue to monitor

## 2019-09-30 NOTE — TOC Progression Note (Signed)
Transition of Care [facility]) - Progression Note    Patient Details  Name: Phyllis [name] MRN: [id number] Date of Birth: Aug 23, 1946  Transition of Care [facility]) CM/SW [place], RN Phone Number: 09/30/2019, 9:57 AM  Clinical Narrative:    The patient did not DC yesterday, will DC today after Dialysis.  Magda Paganini at [facility] is aware that the patient is ready to DC once finished with Dialysis.  A new DC summary will be sent thru the Hub.  The bedside Nurse is aware of the DC to [facility] today   Expected Discharge Plan: [place] Barriers to Discharge: Barriers Resolved  Expected Discharge Plan and Services Expected Discharge Plan: [place] In-house Referral: Clinical Social Work   Post Acute Care Choice: [place] arrangements for the past 2 months: [place] Expected Discharge Date: 09/30/19                           [facility] Agency: [place] ([facility])         Social Determinants of Health (SDOH) Interventions    Readmission Risk Interventions Readmission Risk Prevention Plan 09/26/2019 05/07/2019  Transportation Screening Complete Complete  Medication Review Press photographer) Complete Complete  PCP or Specialist appointment within 3-5 days of discharge Complete Complete  HRI or Home Care Consult Complete Complete  SW Recovery Care/Counseling Consult Complete -  Palliative Care Screening Not Applicable Not [place] Not Applicable Not Applicable  Some recent data might be hidden

## 2019-09-30 NOTE — Progress Notes (Signed)
Report giving to [facility]

## 2019-09-30 NOTE — TOC Transition Note (Signed)
Transition of Care [facility]) - CM/SW Discharge Note   Patient Details  Name: SKYLIA ANGELICA MRN: [id number] Date of Birth: Nov 26, 1945  Transition of Care [facility]) CM/SW Contact:  Su Hilt, RN Phone Number: 09/30/2019, 12:25 PM   Clinical Narrative:     Patient to DC to [facility] today, The Nurse to call report to [facility], and call EMS for transport. The DC summary sent thru the Hub to [facility].  Final next level of care: Skilled Nursing Facility Barriers to Discharge: Barriers Resolved   Patient Goals and CMS Choice Patient states their goals for this hospitalization and ongoing recovery are:: Go home CMS Medicare.gov Compare Post Acute Care list provided to:: Patient Choice offered to / list presented to : Patient  Discharge Placement                       Discharge Plan and Services In-house Referral: Clinical Social Work   Post Acute Care Choice: [place] Agency: [place] ([facility])        Social Determinants of Health (SDOH) Interventions     Readmission Risk Interventions Readmission Risk Prevention Plan 09/26/2019 05/07/2019  Transportation Screening Complete Complete  Medication Review Press photographer) Complete Complete  PCP or Specialist appointment within 3-5 days of discharge Complete Complete  HRI or Home Care Consult Complete Complete  SW Recovery Care/Counseling Consult Complete -  Palliative Care Screening Not Applicable Not [place] Not Applicable Not Applicable  Some recent data might be hidden

## 2019-09-30 NOTE — Progress Notes (Signed)
[facility]  ROUNDING NOTE   Subjective:   Seen and examined on hemodialysis treatment. Tolerating treatment well. UF of goal of  1 liter.     HEMODIALYSIS FLOWSHEET:  Blood Flow Rate (mL/min): 400 mL/min Arterial Pressure (mmHg): -110 mmHg Venous Pressure (mmHg): 130 mmHg Transmembrane Pressure (mmHg): 60 mmHg Ultrafiltration Rate (mL/min): 600 mL/min Dialysate Flow Rate (mL/min): 600 ml/min Conductivity: Machine : 13.5 Conductivity: Machine : 13.5 Dialysis Fluid Bolus: Normal Saline Bolus Amount (mL): 250 mL Dialysate Change: (3k 2.5ca)    Objective:  Vital signs in last 24 hours:  Temp:  [97.7 F (36.5 C)-98.5 F (36.9 C)] 97.7 F (36.5 C) (11/10 1130) Pulse Rate:  [66-93] 81 (11/10 1134) Resp:  [15-23] 15 (11/10 1134) BP: (127-164)/(42-68) 143/56 (11/10 1134) SpO2:  [99 %-100 %] 99 % (11/10 1130) Weight:  [63 kg-64 kg] 63 kg (11/10 1130)  Weight change:  Filed Weights   09/28/19 0700 09/30/19 0856 09/30/19 1130  Weight: 61.1 kg 64 kg 63 kg    Intake/Output: I/O last 3 completed shifts: In: 480 [P.O.:480] Out: 400 [Urine:400]   Intake/Output this shift:  Total I/O In: -  Out: 1000 [Other:1000]  Physical Exam: General: NAD, laying in bed  Head: Normocephalic, atraumatic. Moist oral mucosal membranes  Eyes: Anicteric, PERRL  Neck: Supple, trachea midline  Lungs:  Clear to auscultation  Heart: Regular rate and rhythm  Abdomen:  Soft, nontender,   Extremities:  no peripheral edema.  Neurologic: Nonfocal, moving all four extremities  Skin: No lesions  Access: Left AVF    Basic Metabolic Panel: Recent Labs  Lab 09/25/19 0547 09/25/19 1337 09/26/19 0335 09/27/19 0443 09/27/19 1919 09/28/19 0504 09/28/19 1807 09/29/19 0830 09/30/19 0839  NA 139  --  138  --   --  139  --  136 134*  K 3.7  --  3.0*  --   --  4.1  --  4.3 4.8  CL 96*  --  99  --   --  99  --  97* 95*  CO2 28  --  29  --   --  31  --  27 27  GLUCOSE 102*  --  97   --   --  99  --  110* 103*  BUN 29*  --  20  --   --  16  --  26* 38*  CREATININE 4.26*  --  2.75* 4.37*  --  2.62*  --  4.49* 5.91*  CALCIUM 8.2*  --  7.6*  --   --  8.2*  --  8.6* 8.6*  MG 1.9  --  1.6*  --   --  2.1  --   --   --   PHOS  --  1.4* 3.7  --  1.2*  --  3.1  --  5.4*    Liver Function Tests: Recent Labs  Lab 09/30/19 0839  ALBUMIN 3.6   No results for input(s): LIPASE, AMYLASE in the last 168 hours. No results for input(s): AMMONIA in the last 168 hours.  CBC: Recent Labs  Lab 09/24/19 0346 09/25/19 0547 09/26/19 0335 09/30/19 0839  WBC 6.0 5.3 4.4 4.0  NEUTROABS  --  4.2 3.1  --   HGB 8.6* 8.6* 8.2* 8.3*  HCT 26.4* 26.5* 25.7* 24.9*  MCV 89.2 90.1 90.2 88.9  PLT 106* 108* 119* 170    Cardiac Enzymes: No results for input(s): CKTOTAL, CKMB, CKMBINDEX, TROPONINI in the last 168 hours.  BNP: Invalid  input(s): POCBNP  CBG: Recent Labs  Lab 09/29/19 1230 09/29/19 1802 09/29/19 2114 09/30/19 0820 09/30/19 1157  GLUCAP 120* 109* 127* 94 58    Microbiology: Results for orders placed or performed during the hospital encounter of 09/20/19  Culture, blood (routine x 2)     Status: None   Collection Time: 09/20/19  5:26 AM   Specimen: BLOOD  Result Value Ref Range Status   Specimen Description BLOOD RIGHT FOREARM  Final   Special Requests   Final    BOTTLES DRAWN AEROBIC AND ANAEROBIC Blood Culture adequate volume   Culture   Final    NO GROWTH 5 DAYS Performed at [facility], [address] [place], [place] [address]    Report Status 09/25/2019 FINAL  Final  Culture, blood (routine x 2)     Status: None   Collection Time: 09/20/19  5:26 AM   Specimen: BLOOD  Result Value Ref Range Status   Specimen Description BLOOD RIGHT HAND  Final   Special Requests   Final    BOTTLES DRAWN AEROBIC AND ANAEROBIC Blood Culture adequate volume   Culture   Final    NO GROWTH 5 DAYS Performed at [facility], [address],  [place], [place] [address]    Report Status 09/25/2019 FINAL  Final  SARS Coronavirus 2 by RT PCR (hospital order, performed in [place] hospital lab) Nasopharyngeal Nasopharyngeal Swab     Status: None   Collection Time: 09/20/19  5:26 AM   Specimen: Nasopharyngeal Swab  Result Value Ref Range Status   SARS Coronavirus 2 NEGATIVE NEGATIVE Final    Comment: (NOTE) If result is NEGATIVE SARS-CoV-2 target nucleic acids are NOT DETECTED. The SARS-CoV-2 RNA is generally detectable in upper and lower  respiratory specimens during the acute phase of infection. The lowest  concentration of SARS-CoV-2 viral copies this assay can detect is 250  copies / mL. A negative result does not preclude SARS-CoV-2 infection  and should not be used as the sole basis for treatment or other  patient management decisions.  A negative result may occur with  improper specimen collection / handling, submission of specimen other  than nasopharyngeal swab, presence of viral mutation(s) within the  areas targeted by this assay, and inadequate number of viral copies  (<250 copies / mL). A negative result must be combined with clinical  observations, patient history, and epidemiological information. If result is POSITIVE SARS-CoV-2 target nucleic acids are DETECTED. The SARS-CoV-2 RNA is generally detectable in upper and lower  respiratory specimens dur ing the acute phase of infection.  Positive  results are indicative of active infection with SARS-CoV-2.  Clinical  correlation with patient history and other diagnostic information is  necessary to determine patient infection status.  Positive results do  not rule out bacterial infection or co-infection with other viruses. If result is PRESUMPTIVE POSTIVE SARS-CoV-2 nucleic acids MAY BE PRESENT.   A presumptive positive result was obtained on the submitted specimen  and confirmed on repeat testing.  While 2019 novel coronavirus  (SARS-CoV-2) nucleic acids may be  present in the submitted sample  additional confirmatory testing may be necessary for epidemiological  and / or clinical management purposes  to differentiate between  SARS-CoV-2 and other Sarbecovirus currently known to infect humans.  If clinically indicated additional testing with an alternate test  methodology [phone]) is advised. The SARS-CoV-2 RNA is generally  detectable in upper and lower respiratory sp ecimens during the acute  phase  of infection. The expected result is Negative. Fact Sheet for Patients:  StrictlyIdeas.no Fact Sheet for Healthcare Providers: BankingDealers.co.za This test is not yet approved or cleared by the [place] FDA and has been authorized for detection and/or diagnosis of SARS-CoV-2 by FDA under an Emergency Use Authorization (EUA).  This EUA will remain in effect (meaning this test can be used) for the duration of the COVID-19 declaration under Section 564(b)(1) of the Act, 21 U.S.C. section 360bbb-3(b)(1), unless the authorization is terminated or revoked sooner. Performed at [facility], [address] [place], [place] [address]   MRSA PCR Screening     Status: None   Collection Time: 09/20/19 10:12 AM   Specimen: Nasopharyngeal  Result Value Ref Range Status   MRSA by PCR NEGATIVE NEGATIVE Final    Comment:        The GeneXpert MRSA Assay (FDA approved for NASAL specimens only), is one component of a comprehensive MRSA colonization surveillance program. It is not intended to diagnose MRSA infection nor to guide or monitor treatment for MRSA infections. Performed at [facility], [address] [place] [address], [place] [address]   CULTURE, BLOOD (ROUTINE X 2) w Reflex to ID Panel     Status: None (Preliminary result)   Collection Time: 09/22/19  6:30 PM   Specimen: BLOOD  Result Value Ref Range Status   Specimen Description BLOOD BLOOD LEFT HAND  Final   Special Requests    Final    BOTTLES DRAWN AEROBIC ONLY Blood Culture results may not be optimal due to an inadequate volume of blood received in culture bottles   Culture   Final    NO GROWTH 3 DAYS Performed at [facility], [address], [place], [place] [address]    Report Status PENDING  Incomplete  CULTURE, BLOOD (ROUTINE X 2) w Reflex to ID Panel     Status: None (Preliminary result)   Collection Time: 09/22/19  6:53 PM   Specimen: BLOOD  Result Value Ref Range Status   Specimen Description BLOOD BLOOD RIGHT HAND  Final   Special Requests   Final    BOTTLES DRAWN AEROBIC AND ANAEROBIC Blood Culture adequate volume   Culture   Final    NO GROWTH 3 DAYS Performed at [facility], [address] [place], [place] [address]    Report Status PENDING  Incomplete    Coagulation Studies: No results for input(s): LABPROT, INR in the last 72 hours.  Urinalysis: No results for input(s): COLORURINE, LABSPEC, PHURINE, GLUCOSEU, HGBUR, BILIRUBINUR, KETONESUR, PROTEINUR, UROBILINOGEN, NITRITE, LEUKOCYTESUR in the last 72 hours.  Invalid input(s): APPERANCEUR    Imaging: No results found.   Medications:   . sodium chloride Stopped (09/22/19 1822)   . sodium chloride   Intravenous Once  . amLODipine  10 mg Oral Daily  . aspirin  81 mg Oral Daily  . carvedilol  12.5 mg Oral BID WC  . Chlorhexidine Gluconate Cloth  6 each Topical Q0600  . clopidogrel  75 mg Oral Daily  . epoetin (EPOGEN/PROCRIT) injection  4,000 Units Intravenous Q T,Th,Sa-HD  . hydrALAZINE  50 mg Oral Q8H  . insulin aspart  0-5 Units Subcutaneous QHS  . insulin aspart  0-9 Units Subcutaneous TID WC  . losartan  50 mg Oral Daily  . mouth rinse  15 mL Mouth Rinse BID  . oxybutynin  15 mg Oral QHS  . pantoprazole  40 mg Oral Daily  . polyethylene glycol  17 g  Oral Daily  . pravastatin  40 mg Oral q1800  . torsemide  40 mg Oral Once per day on Sun Mon Wed Fri   sodium chloride, acetaminophen, albuterol,  bisacodyl, guaiFENesin-dextromethorphan, HYDROcodone-acetaminophen, lactulose, midazolam, morphine injection, senna-docusate, sodium chloride flush  Assessment/ Plan:  [name] is a 73 y.o. white female with end stage renal disease on hemodialysis, hypertension, diabetes mellitus type II, overactive bladder, gout, COPD, congestive heart failure, coronary artery disease, aortic atherosclerosis admitted to [facility] on 09/20/2019 for Respiratory distress [R06.03] Hypoxia [R09.02] COPD exacerbation ([place]) [J44.1] ESRD (end stage renal disease) on dialysis ([place]) [N18.6, Z99.2] Community acquired pneumonia, unspecified laterality [J18.9] Congestive heart failure, unspecified HF chronicity, unspecified heart failure type ([place]) [I50.9]  [facility] TTS 60kg  #. ESRD:seen and examined on hemodialysis treatment - Continue TTS schedule.   #. Anemia of CKD: hemoglobin 8.3 - EPO with HD treatment  #. SHPTH - holding binders due to low phosphorus level.   # Hypertension - losartan, torsemide, carvedilol, hydralazine.    LOS: [place] 11/10/202012:09 PM

## 2019-10-01 ENCOUNTER — Emergency Department: Payer: Medicare Other

## 2019-10-01 ENCOUNTER — Encounter: Payer: Self-pay | Admitting: Emergency Medicine

## 2019-10-01 ENCOUNTER — Inpatient Hospital Stay
Admission: EM | Admit: 2019-10-01 | Discharge: 2019-10-08 | DRG: 291 | Disposition: A | Discharge status: Skilled Nursing Facility | Payer: Medicare Other | Attending: Family Medicine | Admitting: Family Medicine

## 2019-10-01 ENCOUNTER — Other Ambulatory Visit: Payer: Self-pay

## 2019-10-01 DIAGNOSIS — E669 Obesity, unspecified: Secondary | ICD-10-CM | POA: Diagnosis present

## 2019-10-01 DIAGNOSIS — J811 Chronic pulmonary edema: Secondary | ICD-10-CM | POA: Diagnosis not present

## 2019-10-01 DIAGNOSIS — I700 Atherosclerosis of aorta: Secondary | ICD-10-CM | POA: Diagnosis present

## 2019-10-01 DIAGNOSIS — Z992 Dependence on renal dialysis: Secondary | ICD-10-CM | POA: Diagnosis not present

## 2019-10-01 DIAGNOSIS — Z883 Allergy status to other anti-infective agents status: Secondary | ICD-10-CM

## 2019-10-01 DIAGNOSIS — Z7982 Long term (current) use of aspirin: Secondary | ICD-10-CM

## 2019-10-01 DIAGNOSIS — J302 Other seasonal allergic rhinitis: Secondary | ICD-10-CM | POA: Diagnosis present

## 2019-10-01 DIAGNOSIS — E785 Hyperlipidemia, unspecified: Secondary | ICD-10-CM | POA: Diagnosis present

## 2019-10-01 DIAGNOSIS — J449 Chronic obstructive pulmonary disease, unspecified: Secondary | ICD-10-CM | POA: Diagnosis present

## 2019-10-01 DIAGNOSIS — D649 Anemia, unspecified: Secondary | ICD-10-CM | POA: Diagnosis not present

## 2019-10-01 DIAGNOSIS — J9621 Acute and chronic respiratory failure with hypoxia: Secondary | ICD-10-CM | POA: Diagnosis not present

## 2019-10-01 DIAGNOSIS — E1169 Type 2 diabetes mellitus with other specified complication: Secondary | ICD-10-CM | POA: Diagnosis present

## 2019-10-01 DIAGNOSIS — N3281 Overactive bladder: Secondary | ICD-10-CM | POA: Diagnosis present

## 2019-10-01 DIAGNOSIS — Z8349 Family history of other endocrine, nutritional and metabolic diseases: Secondary | ICD-10-CM

## 2019-10-01 DIAGNOSIS — G4733 Obstructive sleep apnea (adult) (pediatric): Secondary | ICD-10-CM | POA: Diagnosis present

## 2019-10-01 DIAGNOSIS — F419 Anxiety disorder, unspecified: Secondary | ICD-10-CM | POA: Diagnosis present

## 2019-10-01 DIAGNOSIS — A419 Sepsis, unspecified organism: Secondary | ICD-10-CM

## 2019-10-01 DIAGNOSIS — J9601 Acute respiratory failure with hypoxia: Secondary | ICD-10-CM | POA: Diagnosis present

## 2019-10-01 DIAGNOSIS — Z7951 Long term (current) use of inhaled steroids: Secondary | ICD-10-CM | POA: Diagnosis not present

## 2019-10-01 DIAGNOSIS — M109 Gout, unspecified: Secondary | ICD-10-CM | POA: Diagnosis present

## 2019-10-01 DIAGNOSIS — D631 Anemia in chronic kidney disease: Secondary | ICD-10-CM | POA: Diagnosis present

## 2019-10-01 DIAGNOSIS — R0603 Acute respiratory distress: Secondary | ICD-10-CM | POA: Diagnosis present

## 2019-10-01 DIAGNOSIS — I132 Hypertensive heart and chronic kidney disease with heart failure and with stage 5 chronic kidney disease, or end stage renal disease: Secondary | ICD-10-CM | POA: Diagnosis present

## 2019-10-01 DIAGNOSIS — Z885 Allergy status to narcotic agent status: Secondary | ICD-10-CM

## 2019-10-01 DIAGNOSIS — Z7902 Long term (current) use of antithrombotics/antiplatelets: Secondary | ICD-10-CM

## 2019-10-01 DIAGNOSIS — I5033 Acute on chronic diastolic (congestive) heart failure: Secondary | ICD-10-CM | POA: Diagnosis present

## 2019-10-01 DIAGNOSIS — G8929 Other chronic pain: Secondary | ICD-10-CM | POA: Diagnosis present

## 2019-10-01 DIAGNOSIS — E1151 Type 2 diabetes mellitus with diabetic peripheral angiopathy without gangrene: Secondary | ICD-10-CM | POA: Diagnosis present

## 2019-10-01 DIAGNOSIS — Z713 Dietary counseling and surveillance: Secondary | ICD-10-CM

## 2019-10-01 DIAGNOSIS — Z20828 Contact with and (suspected) exposure to other viral communicable diseases: Secondary | ICD-10-CM | POA: Diagnosis present

## 2019-10-01 DIAGNOSIS — N2581 Secondary hyperparathyroidism of renal origin: Secondary | ICD-10-CM | POA: Diagnosis present

## 2019-10-01 DIAGNOSIS — N186 End stage renal disease: Secondary | ICD-10-CM

## 2019-10-01 DIAGNOSIS — Z515 Encounter for palliative care: Secondary | ICD-10-CM | POA: Diagnosis not present

## 2019-10-01 DIAGNOSIS — Z6825 Body mass index (BMI) 25.0-25.9, adult: Secondary | ICD-10-CM

## 2019-10-01 DIAGNOSIS — K219 Gastro-esophageal reflux disease without esophagitis: Secondary | ICD-10-CM | POA: Diagnosis present

## 2019-10-01 DIAGNOSIS — Z888 Allergy status to other drugs, medicaments and biological substances status: Secondary | ICD-10-CM

## 2019-10-01 DIAGNOSIS — Z79899 Other long term (current) drug therapy: Secondary | ICD-10-CM

## 2019-10-01 DIAGNOSIS — Z8673 Personal history of transient ischemic attack (TIA), and cerebral infarction without residual deficits: Secondary | ICD-10-CM

## 2019-10-01 DIAGNOSIS — E1122 Type 2 diabetes mellitus with diabetic chronic kidney disease: Secondary | ICD-10-CM | POA: Diagnosis present

## 2019-10-01 DIAGNOSIS — I2510 Atherosclerotic heart disease of native coronary artery without angina pectoris: Secondary | ICD-10-CM | POA: Diagnosis present

## 2019-10-01 DIAGNOSIS — Z7189 Other specified counseling: Secondary | ICD-10-CM | POA: Diagnosis not present

## 2019-10-01 DIAGNOSIS — I350 Nonrheumatic aortic (valve) stenosis: Secondary | ICD-10-CM | POA: Diagnosis present

## 2019-10-01 DIAGNOSIS — J189 Pneumonia, unspecified organism: Secondary | ICD-10-CM

## 2019-10-01 DIAGNOSIS — I10 Essential (primary) hypertension: Secondary | ICD-10-CM | POA: Diagnosis present

## 2019-10-01 DIAGNOSIS — M940 Chondrocostal junction syndrome [Tietze]: Secondary | ICD-10-CM | POA: Diagnosis present

## 2019-10-01 DIAGNOSIS — J810 Acute pulmonary edema: Secondary | ICD-10-CM | POA: Diagnosis not present

## 2019-10-01 DIAGNOSIS — Z8249 Family history of ischemic heart disease and other diseases of the circulatory system: Secondary | ICD-10-CM

## 2019-10-01 DIAGNOSIS — E8779 Other fluid overload: Secondary | ICD-10-CM

## 2019-10-01 LAB — LIPASE, BLOOD: Lipase: 22 U/L (ref 11–51)

## 2019-10-01 LAB — CBC WITH DIFFERENTIAL/PLATELET
Abs Immature Granulocytes: 0.1 10*3/uL — ABNORMAL HIGH (ref 0.00–0.07)
Basophils Absolute: 0.1 10*3/uL (ref 0.0–0.1)
Basophils Relative: 1 %
Eosinophils Absolute: 0.5 10*3/uL (ref 0.0–0.5)
Eosinophils Relative: 5 %
HCT: 30 % — ABNORMAL LOW (ref 36.0–46.0)
Hemoglobin: 9.7 g/dL — ABNORMAL LOW (ref 12.0–15.0)
Immature Granulocytes: 1 %
Lymphocytes Relative: 14 %
Lymphs Abs: 1.3 10*3/uL (ref 0.7–4.0)
MCH: 29.5 pg (ref 26.0–34.0)
MCHC: 32.3 g/dL (ref 30.0–36.0)
MCV: 91.2 fL (ref 80.0–100.0)
Monocytes Absolute: 0.7 10*3/uL (ref 0.1–1.0)
Monocytes Relative: 7 %
Neutro Abs: 6.8 10*3/uL (ref 1.7–7.7)
Neutrophils Relative %: 72 %
Platelets: 300 10*3/uL (ref 150–400)
RBC: 3.29 MIL/uL — ABNORMAL LOW (ref 3.87–5.11)
RDW: 14.7 % (ref 11.5–15.5)
WBC: 9.4 10*3/uL (ref 4.0–10.5)
nRBC: 0 % (ref 0.0–0.2)

## 2019-10-01 LAB — BLOOD GAS, VENOUS
Acid-Base Excess: 4.4 mmol/L — ABNORMAL HIGH (ref 0.0–2.0)
Bicarbonate: 29.2 mmol/L — ABNORMAL HIGH (ref 20.0–28.0)
O2 Saturation: 99.1 %
Patient temperature: 37
pCO2, Ven: 43 mmHg — ABNORMAL LOW (ref 44.0–60.0)
pH, Ven: 7.44 — ABNORMAL HIGH (ref 7.250–7.430)
pO2, Ven: 130 mmHg — ABNORMAL HIGH (ref 32.0–45.0)

## 2019-10-01 LAB — GLUCOSE, CAPILLARY
Glucose-Capillary: 169 mg/dL — ABNORMAL HIGH (ref 70–99)
Glucose-Capillary: 162 mg/dL — ABNORMAL HIGH (ref 70–99)
Glucose-Capillary: 139 mg/dL — ABNORMAL HIGH (ref 70–99)

## 2019-10-01 LAB — COMPREHENSIVE METABOLIC PANEL
ALT: 9 U/L (ref 0–44)
AST: 14 U/L — ABNORMAL LOW (ref 15–41)
Albumin: 3.6 g/dL (ref 3.5–5.0)
Alkaline Phosphatase: 59 U/L (ref 38–126)
Anion gap: 13 (ref 5–15)
BUN: 31 mg/dL — ABNORMAL HIGH (ref 8–23)
CO2: 28 mmol/L (ref 22–32)
Calcium: 8.8 mg/dL — ABNORMAL LOW (ref 8.9–10.3)
Chloride: 95 mmol/L — ABNORMAL LOW (ref 98–111)
Creatinine, Ser: 4.63 mg/dL — ABNORMAL HIGH (ref 0.44–1.00)
GFR calc Af Amer: 10 mL/min — ABNORMAL LOW (ref >60)
GFR calc non Af Amer: 9 mL/min — ABNORMAL LOW (ref >60)
Glucose, Bld: 167 mg/dL — ABNORMAL HIGH (ref 70–99)
Potassium: 4.7 mmol/L (ref 3.5–5.1)
Sodium: 136 mmol/L (ref 135–145)
Total Bilirubin: 0.7 mg/dL (ref 0.3–1.2)
Total Protein: 6.4 g/dL — ABNORMAL LOW (ref 6.5–8.1)

## 2019-10-01 LAB — BRAIN NATRIURETIC PEPTIDE: B Natriuretic Peptide: 1854 pg/mL — ABNORMAL HIGH (ref 0.0–100.0)

## 2019-10-01 LAB — LACTIC ACID, PLASMA
Lactic Acid, Venous: 0.7 mmol/L (ref 0.5–1.9)
Lactic Acid, Venous: 0.7 mmol/L (ref 0.5–1.9)

## 2019-10-01 LAB — TROPONIN I (HIGH SENSITIVITY)
Troponin I (High Sensitivity): 23 ng/L — ABNORMAL HIGH (ref <18)
Troponin I (High Sensitivity): 26 ng/L — ABNORMAL HIGH (ref <18)

## 2019-10-01 LAB — PROCALCITONIN: Procalcitonin: 0.92 ng/mL

## 2019-10-01 MED ORDER — ALBUTEROL SULFATE (2.5 MG/3ML) 0.083% IN NEBU: 2.5000 mg | INHALATION_SOLUTION | Freq: Four times a day (QID) | RESPIRATORY_TRACT | Status: DC | PRN

## 2019-10-01 MED ORDER — ASPIRIN 81 MG PO CHEW
81.0000 mg | CHEWABLE_TABLET | Freq: Every day | ORAL | Status: DC
  Administered 2019-10-01 – 2019-10-04 (×4): 81 mg via ORAL
  Filled 2019-10-01 (×3): qty 1

## 2019-10-01 MED ORDER — ONDANSETRON HCL 4 MG PO TABS: 4.0000 mg | ORAL_TABLET | Freq: Four times a day (QID) | ORAL | Status: DC | PRN

## 2019-10-01 MED ORDER — SODIUM CHLORIDE 0.9 % IV SOLN: INTRAVENOUS | Status: DC | PRN

## 2019-10-01 MED ORDER — VANCOMYCIN HCL IN DEXTROSE 1-5 GM/200ML-% IV SOLN
1000.0000 mg | Freq: Once | INTRAVENOUS | Status: AC
  Administered 2019-10-01: 1000 mg via INTRAVENOUS
  Filled 2019-10-01: qty 200

## 2019-10-01 MED ORDER — INSULIN ASPART 100 UNIT/ML ~~LOC~~ SOLN
0.0000 [IU] | SUBCUTANEOUS | Status: DC
  Administered 2019-10-01: 1 [IU] via SUBCUTANEOUS
  Administered 2019-10-01: 2 [IU] via SUBCUTANEOUS
  Administered 2019-10-02: 1 [IU] via SUBCUTANEOUS
  Administered 2019-10-02: 2 [IU] via SUBCUTANEOUS
  Administered 2019-10-02: 1 [IU] via SUBCUTANEOUS
  Administered 2019-10-02: 2 [IU] via SUBCUTANEOUS
  Filled 2019-10-01 (×6): qty 1

## 2019-10-01 MED ORDER — SODIUM CHLORIDE 0.9 % IV SOLN
2.0000 g | Freq: Once | INTRAVENOUS | Status: AC
  Administered 2019-10-01: 2 g via INTRAVENOUS
  Filled 2019-10-01: qty 2

## 2019-10-01 MED ORDER — HEPARIN SODIUM (PORCINE) 5000 UNIT/ML IJ SOLN
5000.0000 [IU] | Freq: Three times a day (TID) | INTRAMUSCULAR | Status: DC
  Administered 2019-10-02 – 2019-10-08 (×17): 5000 [IU] via SUBCUTANEOUS
  Filled 2019-10-01 (×18): qty 1

## 2019-10-01 MED ORDER — PRAVASTATIN SODIUM 20 MG PO TABS
40.0000 mg | ORAL_TABLET | Freq: Every day | ORAL | Status: DC
  Administered 2019-10-02 – 2019-10-07 (×6): 40 mg via ORAL
  Filled 2019-10-01 (×6): qty 2
  Filled 2019-10-01: qty 1

## 2019-10-01 MED ORDER — ORAL CARE MOUTH RINSE
15.0000 mL | Freq: Two times a day (BID) | OROMUCOSAL | Status: DC
  Administered 2019-10-02 – 2019-10-08 (×8): 15 mL via OROMUCOSAL

## 2019-10-01 MED ORDER — ACETAMINOPHEN 650 MG RE SUPP: 650.0000 mg | Freq: Four times a day (QID) | RECTAL | Status: DC | PRN

## 2019-10-01 MED ORDER — HYDRALAZINE HCL 50 MG PO TABS
50.0000 mg | ORAL_TABLET | Freq: Three times a day (TID) | ORAL | Status: DC
  Administered 2019-10-02 – 2019-10-08 (×11): 50 mg via ORAL
  Filled 2019-10-01 (×14): qty 1

## 2019-10-01 MED ORDER — SODIUM CHLORIDE 0.9% FLUSH
3.0000 mL | Freq: Two times a day (BID) | INTRAVENOUS | Status: DC
  Administered 2019-10-01 – 2019-10-08 (×11): 3 mL via INTRAVENOUS

## 2019-10-01 MED ORDER — ONDANSETRON HCL 4 MG/2ML IJ SOLN: 4.0000 mg | Freq: Four times a day (QID) | INTRAMUSCULAR | Status: DC | PRN

## 2019-10-01 MED ORDER — CLOPIDOGREL BISULFATE 75 MG PO TABS
75.0000 mg | ORAL_TABLET | Freq: Every day | ORAL | Status: DC
  Administered 2019-10-02 – 2019-10-08 (×7): 75 mg via ORAL
  Filled 2019-10-01 (×6): qty 1

## 2019-10-01 MED ORDER — ACETAMINOPHEN 325 MG PO TABS
650.0000 mg | ORAL_TABLET | Freq: Four times a day (QID) | ORAL | Status: DC | PRN
  Administered 2019-10-02 – 2019-10-03 (×3): 650 mg via ORAL
  Filled 2019-10-01 (×4): qty 2

## 2019-10-01 MED ORDER — TORSEMIDE 20 MG PO TABS
40.0000 mg | ORAL_TABLET | ORAL | Status: DC
  Administered 2019-10-03 – 2019-10-06 (×3): 40 mg via ORAL
  Filled 2019-10-01 (×4): qty 2

## 2019-10-01 MED ORDER — MOMETASONE FURO-FORMOTEROL FUM 200-5 MCG/ACT IN AERO
2.0000 | INHALATION_SPRAY | Freq: Two times a day (BID) | RESPIRATORY_TRACT | Status: DC
  Administered 2019-10-02 – 2019-10-08 (×12): 2 via RESPIRATORY_TRACT
  Filled 2019-10-01 (×2): qty 8.8

## 2019-10-01 MED ORDER — AMLODIPINE BESYLATE 10 MG PO TABS
10.0000 mg | ORAL_TABLET | Freq: Every day | ORAL | Status: DC
  Administered 2019-10-02 – 2019-10-06 (×4): 10 mg via ORAL
  Filled 2019-10-01 (×6): qty 1

## 2019-10-01 MED ORDER — IPRATROPIUM-ALBUTEROL 0.5-2.5 (3) MG/3ML IN SOLN
3.0000 mL | Freq: Once | RESPIRATORY_TRACT | Status: AC
  Administered 2019-10-01: 3 mL via RESPIRATORY_TRACT
  Filled 2019-10-01: qty 3

## 2019-10-01 MED ORDER — METHYLPREDNISOLONE SODIUM SUCC 125 MG IJ SOLR
125.0000 mg | Freq: Once | INTRAMUSCULAR | Status: AC
  Administered 2019-10-01: 125 mg via INTRAVENOUS
  Filled 2019-10-01: qty 2

## 2019-10-01 MED ORDER — CHLORHEXIDINE GLUCONATE CLOTH 2 % EX PADS
6.0000 | MEDICATED_PAD | Freq: Every day | CUTANEOUS | Status: DC
  Administered 2019-10-02 – 2019-10-08 (×7): 6 via TOPICAL

## 2019-10-01 MED ORDER — CARVEDILOL 12.5 MG PO TABS
12.5000 mg | ORAL_TABLET | Freq: Two times a day (BID) | ORAL | Status: DC
  Administered 2019-10-03 – 2019-10-08 (×7): 12.5 mg via ORAL
  Filled 2019-10-01 (×10): qty 1

## 2019-10-01 MED ORDER — CHLORHEXIDINE GLUCONATE 0.12 % MT SOLN
15.0000 mL | Freq: Two times a day (BID) | OROMUCOSAL | Status: DC
  Administered 2019-10-02 – 2019-10-08 (×11): 15 mL via OROMUCOSAL
  Filled 2019-10-01 (×11): qty 15

## 2019-10-01 MED ORDER — LOSARTAN POTASSIUM 50 MG PO TABS
50.0000 mg | ORAL_TABLET | Freq: Every day | ORAL | Status: DC
  Administered 2019-10-03 – 2019-10-06 (×4): 50 mg via ORAL
  Filled 2019-10-01 (×5): qty 1

## 2019-10-01 NOTE — ED Notes (Signed)
Assumed care of patient aox4, tolerating c-pap well. vss safety maintained awaiting bed status.

## 2019-10-01 NOTE — ED Provider Notes (Signed)
[facility] Emergency Department Provider Note  ____________________________________________   First MD Initiated Contact with Patient 10/01/19 1648     (approximate)  I have reviewed the triage vital signs and the nursing notes.   HISTORY  Chief Complaint Respiratory Distress    HPI Phyllis [name] is a 73 y.o. female with ESRD, Tuesday Thursday Saturdays acute hypoxic respiratory failure from flash pulmonary edema with severe aortic stenosis who presented with shortness of breath.  Patient has a tenuous fluid status.  She was stopped off of Lasix and started on torsemide on off dialysis days.  Patient was also treated for possible pneumonia at admission.  She gets dialysis on Tuesdays, Thursdays, Saturdays.  Patient was called out for respiratory distress.  Initially patient was satting 77% on room air.  Patient was in the low 90s on nonrebreather so placed on CPAP.  She had negative coronavirus testing.  Unable to get full HPI due to patient's being on BiPAP and respiratory issue.    Past Medical History:  Diagnosis Date  . (HFpEF) heart failure with preserved ejection fraction ([place])    a. TTE 01/2014: nl LV sys fxn, no valvular abnormalities; b. TTE 11/16: nl EF, mild LVH;  c. 04/2019 Echo: EF 60-65%. DD. Nl RV fxn. Mod AS. 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  . Allergy   . Anemia of chronic disease   . Anxiety   . Aortic atherosclerosis ([place])   . Asthma   . Chronic back pain   . COPD (chronic obstructive pulmonary disease) ([place])   . Diabetes mellitus with complication ([place])   . ESRD on hemodialysis ([place])    a. Tues/Sat; b. 2/2 small kidneys  . Essential hypertension   . Fistula    lower left arm  . GERD (gastroesophageal reflux disease)   . Gout   . History of exercise stress test    a. 01/2014: no evidence of ischemia; b. Lexiscan 08/2015: no sig ischemia, severe GI uptake artifact, low risk; c. CPET @ [facility] 09/2016:  exercised 3 min 12 sec on bike without incline, 2.28 METs, VO2 of 8.1, 48% of predicted, indicating mod to sev functional impairment, evidence of blunted HR, stroke volume, and BP augmentation as well as ventilation-perfusion mismatch with exercise  . HLD (hyperlipidemia)   . Moderate aortic stenosis    a. 04/2019 Echo: Mod AS.  . Non-obstructive Carotid arterial disease ([place])    a. 12/2017: <50% bilat ICA dzs.  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 Sleep apnea     Patient Active Problem List   Diagnosis Date Noted  . Aortic valve stenosis   . Acute respiratory failure with hypoxemia ([place]) 09/20/2019  . COPD with acute bronchitis ([place]) 09/05/2019  . Congestive heart failure ([place]) 05/24/2019  . Hyperkalemia 11/29/2018  . Chronic diastolic heart failure ([place]) 11/02/2018  . HTN (hypertension) 11/02/2018  . Uncontrolled hypertension 10/21/2018  . Pressure injury of skin 03/16/2018  . CVA (cerebral vascular accident) ([place]) 01/16/2018  . Aortic atherosclerosis ([place]) 12/11/2017  . Chest pain 09/18/2017  . Hyperlipidemia 08/06/2015  . Complication from renal dialysis device 04/12/2015  . SOB (shortness of breath) 02/01/2015  . End stage renal disease on dialysis ([place]) 02/01/2015  . Type 2 diabetes mellitus with other specified complication ([place]) [id number]  . Asthma 02/01/2015  . Acute on chronic diastolic CHF (congestive heart failure) ([place]) 02/01/2015    Past Surgical History:  Procedure Laterality  Date  . carpel tunnel    . GALLBLADDER SURGERY    . PERIPHERAL VASCULAR CATHETERIZATION N/A 04/12/2015   Procedure: A/V Shuntogram/Fistulagram;  Surgeon: Algernon Huxley, MD;  Location: [place] [facility];  Service: Cardiovascular;  Laterality: N/A;  . PERIPHERAL VASCULAR CATHETERIZATION N/A 04/12/2015   Procedure: A/V Shunt Intervention;  Surgeon: Algernon Huxley, MD;  Location: [place] [facility];  Service: Cardiovascular;  Laterality: N/A;  . PERIPHERAL VASCULAR  CATHETERIZATION N/A 06/09/2015   Procedure: Dialysis/Perma Catheter Removal;  Surgeon: Katha Cabal, MD;  Location: [place] [facility];  Service: Cardiovascular;  Laterality: N/A;    Prior to Admission medications   Medication Sig Start Date End Date Taking? Authorizing Provider  acetaminophen (TYLENOL) 500 MG tablet Take 1 tablet (500 mg total) by mouth every 6 (six) hours as needed for mild pain or fever. 09/29/19   Danford, Suann Larry, MD  albuterol (PROVENTIL HFA;VENTOLIN HFA) 108 (90 Base) MCG/ACT inhaler Inhale 2 puffs into the lungs every 6 (six) hours as needed for wheezing or shortness of breath.    [provider]  amLODipine (NORVASC) 10 MG tablet Take 10 mg by mouth daily.  12/29/13   [provider]  aspirin 81 MG chewable tablet Chew 1 tablet (81 mg total) by mouth daily. 09/30/19   Danford, Suann Larry, MD  calcium acetate (PHOSLO) 667 MG capsule Take 1,334 mg by mouth 3 (three) times daily with meals.     [provider]  carvedilol (COREG) 12.5 MG tablet Take 12.5 mg by mouth 2 (two) times daily with a meal.  01/09/14   [provider]  cetirizine (ZYRTEC) 10 MG tablet Take 10 mg by mouth daily as needed for allergies.     [provider]  clopidogrel (PLAVIX) 75 MG tablet Take 1 tablet (75 mg total) by mouth daily. 01/18/18   Epifanio Lesches, MD  Fluticasone-Salmeterol (ADVAIR) 250-50 MCG/DOSE AEPB Inhale 1 puff into the lungs 2 (two) times daily as needed (for shortness of breath). 09/07/19   Henreitta Leber, MD  hydrALAZINE (APRESOLINE) 50 MG tablet Take 1 tablet (50 mg total) by mouth every 8 (eight) hours. 09/29/19   Danford, Suann Larry, MD  HYDROcodone-acetaminophen (NORCO) 7.5-325 MG tablet Take 1-2 tablets by mouth daily as needed (pain). 09/29/19   Danford, Suann Larry, MD  lidocaine-prilocaine (EMLA) cream Apply 1 application topically as needed (prior to treatment).     [provider]  losartan (COZAAR)  50 MG tablet Take 50 mg by mouth daily. 03/18/19   [provider]  omeprazole (PRILOSEC) 20 MG capsule Take 1 capsule (20 mg total) by mouth 2 (two) times daily before a meal. 09/07/19   Sainani, Belia Heman, MD  ondansetron (ZOFRAN) 4 MG tablet Take 4 mg by mouth every 8 (eight) hours as needed. 05/20/19   [provider]  oxybutynin (DITROPAN XL) 15 MG 24 hr tablet Take 15 mg by mouth daily. 12/24/17   [provider]  pravastatin (PRAVACHOL) 40 MG tablet Take 40 mg by mouth at bedtime.     [provider]  topiramate (TOPAMAX) 25 MG tablet Take 1 tablet (25 mg total) by mouth as needed (for headaches). 09/07/19   Henreitta Leber, MD  torsemide (DEMADEX) 20 MG tablet Take 2 tablets (40 mg total) by mouth every Monday,Wednesday,Friday, and Sunday at 6 PM. 10/01/19   Danford, Suann Larry, MD    Allergies Enalapril maleate; Nitrofurantoin; Sulfamethoxazole-trimethoprim; 2,4-d dimethylamine (amisol); Baclofen; Neosporin [neomycin-bacitracin zn-polymyx];  Quinine; Ultram [tramadol]; Zocor [simvastatin]; Bactrim [sulfamethoxazole-trimethoprim]; Levodopa; Macrodantin [nitrofurantoin macrocrystal]; and Quinine derivatives  Family History  Problem Relation Age of Onset  . Hypertension Mother   . Hyperlipidemia Mother   . Heart disease Father   . Heart attack Father 11  . Hypertension Father   . Hyperlipidemia Father   . Heart disease Brother        CABG   . Heart attack Brother   . Breast cancer Neg Hx     Social History Social History   Tobacco Use  . Smoking status: Never Smoker  . Smokeless tobacco: Never Used  Substance Use Topics  . Alcohol use: No  . Drug use: No      Review of Systems Unable to get full review of system due to patient being on BiPAP ____________________________________________   PHYSICAL EXAM:  VITAL SIGNS: Blood pressure (!) 158/59, pulse 96, temperature 99.6 F (37.6 C), temperature source Rectal, resp. rate (!) 39,  height 5\' 2"  (1.575 m), weight 63 kg, SpO2 97 %.  Constitutional: Wakes to voice and following simple commands. Eyes: Conjunctivae are normal. EOMI. Head: Atraumatic. Nose: No congestion/rhinnorhea. Mouth/Throat: Mucous membranes are moist.   Neck: No stridor. Trachea Midline. FROM Cardiovascular: Normal rate, regular rhythm. Grossly normal heart sounds.  Good peripheral circulation. Respiratory: Increased work of breathing, on BiPAP, coarse breath sounds bilaterally Gastrointestinal: Soft and nontender. No distention. No abdominal bruits.  Musculoskeletal: No lower extremity tenderness nor edema.  No joint effusions. Neurologic:  No gross focal neurologic deficits are appreciated.  Equal strength in her hands and able to wiggle her bilateral toes Skin:  Skin is warm, dry and intact. No rash noted. Psychiatric: Unable to assess due to acuity of situation GU: Deferred   ____________________________________________   LABS (all labs ordered are listed, but only abnormal results are displayed)  Labs Reviewed  GLUCOSE, CAPILLARY - Abnormal; Notable for the following components:      Result Value   Glucose-Capillary 169 (*)    All other components within normal limits  CBC WITH DIFFERENTIAL/PLATELET - Abnormal; Notable for the following components:   RBC 3.29 (*)    Hemoglobin 9.7 (*)    HCT 30.0 (*)    Abs Immature Granulocytes 0.10 (*)    All other components within normal limits  COMPREHENSIVE METABOLIC PANEL - Abnormal; Notable for the following components:   Chloride 95 (*)    Glucose, Bld 167 (*)    BUN 31 (*)    Creatinine, Ser 4.63 (*)    Calcium 8.8 (*)    Total Protein 6.4 (*)    AST 14 (*)    GFR calc non Af Amer 9 (*)    GFR calc Af Amer 10 (*)    All other components within normal limits  BRAIN NATRIURETIC PEPTIDE - Abnormal; Notable for the following components:   B Natriuretic Peptide 1,854.0 (*)    All other components within normal limits  BLOOD GAS, VENOUS -  Abnormal; Notable for the following components:   pH, Ven 7.44 (*)    pCO2, Ven 43 (*)    pO2, Ven 130.0 (*)    Bicarbonate 29.2 (*)    Acid-Base Excess 4.4 (*)    All other components within normal limits  TROPONIN I (HIGH SENSITIVITY) - Abnormal; Notable for the following components:   Troponin I (High Sensitivity) 23 (*)    All other components within normal limits  SARS CORONAVIRUS 2 (TAT 6-24 HRS)  URINE CULTURE  LIPASE,  BLOOD  PROCALCITONIN  PROCALCITONIN  LACTIC ACID, PLASMA  LACTIC ACID, PLASMA  TROPONIN I (HIGH SENSITIVITY)   ____________________________________________   ED ECG REPORT I, Vanessa Meeker, the attending physician, personally viewed and interpreted this ECG.  EKG normal sinus rate of 96, no ST elevation, no T wave inversions, normal intervals ____________________________________________  RADIOLOGY Robert Bellow, personally viewed and evaluated these images (plain radiographs) as part of my medical decision making, as well as reviewing the written report by the radiologist.  ED MD interpretation: Chest x-ray concerning for possible opacities edema versus infection  Official radiology report(s): Dg Chest 1 View  Result Date: 10/01/2019 CLINICAL DATA:  Shortness of breath, respiratory distress EXAM: CHEST  1 VIEW COMPARISON:  09/24/2019 FINDINGS: Multifocal interstitial/airspace opacities, lower lobe predominant. This appearance is new/progressive from the prior. No pleural effusion or pneumothorax. The heart is normal in size.  Thoracic aortic atherosclerosis. Degenerative changes of the visualized thoracolumbar spine. IMPRESSION: Multifocal interstitial/airspace opacities, possibly reflecting interstitial edema given the clinical history. No associated pleural effusions. Technically speaking, in the appropriate clinical setting, atypical/viral pneumonia (including COVID) is within the differential. Electronically Signed   By: Julian Hy M.D.   On:  10/01/2019 17:30    ____________________________________________   PROCEDURES  Procedure(s) performed (including Critical Care):  .Critical Care Performed by: Vanessa Sherburne, MD Authorized by: Vanessa Orin, MD   Critical care provider statement:    Critical care time (minutes):  45   Critical care was necessary to treat or prevent imminent or life-threatening deterioration of the following conditions:  Respiratory failure   Critical care was time spent personally by me on the following activities:  Discussions with consultants, evaluation of patient's response to treatment, examination of patient, ordering and performing treatments and interventions, ordering and review of laboratory studies, ordering and review of radiographic studies, pulse oximetry, re-evaluation of patient's condition, obtaining history from patient or surrogate and review of old charts    ____________________________________________   INITIAL IMPRESSION / ASSESSMENT AND PLAN / ED COURSE   Burkleigh Hor Cavness was evaluated in Emergency Department on 10/01/2019 for the symptoms described in the history of present illness. She was evaluated in the context of the global COVID-19 pandemic, which necessitated consideration that the patient might be at risk for infection with the SARS-CoV-2 virus that causes COVID-19. Institutional protocols and algorithms that pertain to the evaluation of patients at risk for COVID-19 are in a state of rapid change based on information released by regulatory bodies including the CDC and federal and state organizations. These policies and algorithms were followed during the patient's care in the ED.     Pt presents with SOB.  Biggest concern is hospital-acquired pneumonia given her coarse breath sounds versus fluid overload..  Patient not super hypertensive to suggest flashing at this time will get chest x-ray to further evaluate.  Patient does have significant aortic stenosis so her  fluid balance is very hard.  PNA-will get xray to evaluation Anemia-CBC to evaluate ACS- will get trops Arrhythmia-Will get EKG and keep on monitor.  COVID- will get testing per algorithm. PE-lower suspicion given no risk factors and other cause more likely  Patient pulling volumes around 300 on BiPAP.  Will give a DuoNeb to see if that helps open her up at all.  Also give a dose of Solu-Medrol given her history of COPD.  PCO2 is not significantly elevated at 43.  Therefore less likely COPD.  Hemoglobin is above baseline  Troponin is 23 which is down from prior  K is 4.7  BNP is 1854 which is up from her prior admission  Chest x-ray concerning for possible interstitial edema versus pneumonia.  Will cover with antibiotics and discussed with renal.  Discussed with Dr. Juleen China.  Patient was last dialyzed yesterday.  However given the patient has questionable interstitial edema and a procalcitonin is downtrending I suspect that be dialyzed today will help her status.  He will work on try to get her dialyzed today.  Discussed the hospital team for admission to the ICU.  Patient admitted.  ____________________________________________   FINAL CLINICAL IMPRESSION(S) / ED DIAGNOSES   Final diagnoses:  Respiratory distress  Other hypervolemia  ESRD (end stage renal disease) ([place])  Healthcare-associated pneumonia  Sepsis, due to unspecified organism, unspecified whether acute organ dysfunction present [facility])     MEDICATIONS GIVEN DURING THIS VISIT:  Medications  vancomycin (VANCOCIN) IVPB 1000 mg/200 mL premix (1,000 mg Intravenous New Bag/Given 10/01/19 1807)  ceFEPIme (MAXIPIME) 2 g in sodium chloride 0.9 % 100 mL IVPB (2 g Intravenous New Bag/Given 10/01/19 1809)  ipratropium-albuterol (DUONEB) 0.5-2.5 (3) MG/3ML nebulizer solution 3 mL (3 mLs Nebulization Given 10/01/19 1757)  methylPREDNISolone sodium succinate (SOLU-MEDROL) 125 mg/2 mL injection 125 mg (125 mg Intravenous  Given 10/01/19 1753)     ED Discharge Orders    None       Note:  This document was prepared using Dragon voice recognition software and may include unintentional dictation errors.   Vanessa Miner, MD 10/01/19 [phone]

## 2019-10-01 NOTE — ED Notes (Signed)
Pt changed into a gown at this time, pt agreeable to having shirt cut off, pt's [facility] cloth mask, glasses, and T-shirt (per her request) placed into patient belongings bag at this time.

## 2019-10-01 NOTE — ED Notes (Signed)
Blood glucose 162

## 2019-10-01 NOTE — ED Notes (Signed)
Pt provided with 2 warm blankets per her request.

## 2019-10-01 NOTE — H&P (Signed)
History and Physical    Phyllis [name] [id number] DOB: [birth year] DOA: 10/01/2019  PCP: [name], MD  Patient coming from: [place] have personally briefly reviewed patient's old medical records in [place]  Chief Complaint: Shortness of breath  HPI: Phyllis [name] is a 73 y.o. female with medical history significant for ESRD on TTS HD, HFpEF, COPD, moderate aortic stenosis, type 2 diabetes, hypertension, and hyperlipidemia who presents to the ED for evaluation of dyspnea.  Patient was recently admitted from 09/20/2019-09/30/2019 for acute respiratory failure with hypoxia felt secondary to acute CHF exacerbation/pulmonary edema.  She required intubation and was felt to have a challenging fluid balance given her aortic stenosis and dialysis dependence.  At time of discharge she was taken off Lasix and switched to torsemide to be taken on nondialysis days.  She also completed a total 7-day course of antibiotics for possible pneumonia.  Patient states she felt well at time of discharge from the hospital until earlier today when she had sudden onset of shortness of breath which has been occurring even at rest.  She is not sure if she was able to take torsemide today.  She called EMS for evaluation for respiratory distress.  Per ED documentation O2 saturation was 77% on room air on their arrival and improved into the low 90s on nonrebreather and patient was subsequently placed on CPAP and brought to the ED.  ED Course:  Initial vitals showed BP 122/50, pulse 84, RR 26, temp 99.6 Fahrenheit, SPO2 97% on 50% FiO2 on CPAP.  Labs are notable for WBC 9.4, hemoglobin 9.7, platelets 300,000, sodium 136, potassium 4.7, bicarb 28, BUN 31, creatinine 4.63, lipase 23, BNP 1854, procalcitonin 0.92.  VBG showed pH 7.44, PCO2 43, PO2 130.  SARS-CoV-2 test was obtained and pending.  Portable chest x-ray showed new/progressed multifocal interstitial opacities without  pleural effusions.  Patient was given IV vancomycin and cefepime, IV Solu-Medrol 125 mg, and DuoNeb treatment.  EDP discussed with nephrology.  The hospitalist service was consulted to admit for further evaluation and management.  Review of Systems: All systems reviewed and are negative except as documented in history of present illness above.   Past Medical History:  Diagnosis Date   (HFpEF) heart failure with preserved ejection fraction ([place])    a. TTE 01/2014: nl LV sys fxn, no valvular abnormalities; b. TTE 11/16: nl EF, mild LVH;  c. 04/2019 Echo: EF 60-65%. DD. Nl RV fxn. Mod AS. 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   Allergy    Anemia of chronic disease    Anxiety    Aortic atherosclerosis (HCC)    Asthma    Chronic back pain    COPD (chronic obstructive pulmonary disease) ([place])    Diabetes mellitus with complication ([place])    ESRD on hemodialysis ([place])    a. Tues/Sat; b. 2/2 small kidneys   Essential hypertension    Fistula    lower left arm   GERD (gastroesophageal reflux disease)    Gout    History of exercise stress test    a. 01/2014: no evidence of ischemia; b. Lexiscan 08/2015: no sig ischemia, severe GI uptake artifact, low risk; c. CPET @ [facility] 09/2016: exercised 3 min 12 sec on bike without incline, 2.28 METs, VO2 of 8.1, 48% of predicted, indicating mod to sev functional impairment, evidence of blunted HR, stroke volume, and BP augmentation as well as ventilation-perfusion mismatch with exercise   HLD (hyperlipidemia)  Moderate aortic stenosis    a. 04/2019 Echo: Mod AS.   Non-obstructive Carotid arterial disease ([place])    a. 12/2017: <50% bilat ICA dzs.   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    right chest   Sleep apnea     Past Surgical History:  Procedure Laterality Date   carpel tunnel     GALLBLADDER SURGERY     PERIPHERAL VASCULAR CATHETERIZATION N/A 04/12/2015   Procedure: A/V Shuntogram/Fistulagram;  Surgeon:  Algernon Huxley, MD;  Location: [facility];  Service: Cardiovascular;  Laterality: N/A;   PERIPHERAL VASCULAR CATHETERIZATION N/A 04/12/2015   Procedure: A/V Shunt Intervention;  Surgeon: Algernon Huxley, MD;  Location: [facility];  Service: Cardiovascular;  Laterality: N/A;   PERIPHERAL VASCULAR CATHETERIZATION N/A 06/09/2015   Procedure: Dialysis/Perma Catheter Removal;  Surgeon: Katha Cabal, MD;  Location: [facility];  Service: Cardiovascular;  Laterality: N/A;    Social History:  reports that she has never smoked. She has never used smokeless tobacco. She reports that she does not drink alcohol or use drugs.  Allergies  Allergen Reactions   Enalapril Maleate Other (See Comments)    Other reaction(s): Headache   Nitrofurantoin Swelling and Rash    Other Reaction: swelling of body   Sulfamethoxazole-Trimethoprim Swelling   2,4-D Dimethylamine (Amisol) Rash and Other (See Comments)    Other Reaction: h/a   Baclofen Other (See Comments) and Nausea Only    lightheadness ,drowsiness , muscle weakness , twitching in hands    Neosporin [Neomycin-Bacitracin Zn-Polymyx] Other (See Comments) and Rash    Other Reaction: irritation Skin irritation    Quinine Nausea And Vomiting, Rash and Other (See Comments)    Other Reaction: Vomiting, rash, h/a, vision   Ultram [Tramadol] Palpitations   Zocor [Simvastatin] Other (See Comments) and Rash    Other Reaction: muscle spasms Muscle pain and spasms   Bactrim [Sulfamethoxazole-Trimethoprim] Swelling   Levodopa Other (See Comments)    Reaction: unknown   Macrodantin [Nitrofurantoin Macrocrystal] Swelling   Quinine Derivatives Other (See Comments)    Vertigo,nausea vomiting blurred vision headache ears sensitivity     Family History  Problem Relation Age of Onset   Hypertension Mother    Hyperlipidemia Mother    Heart disease Father    Heart attack Father 73   Hypertension Father     Hyperlipidemia Father    Heart disease Brother        CABG    Heart attack Brother    Breast cancer Neg Hx      Prior to Admission medications   Medication Sig Start Date End Date Taking? Authorizing Provider  acetaminophen (TYLENOL) 500 MG tablet Take 1 tablet (500 mg total) by mouth every 6 (six) hours as needed for mild pain or fever. 09/29/19   Danford, Suann Larry, MD  albuterol (PROVENTIL HFA;VENTOLIN HFA) 108 (90 Base) MCG/ACT inhaler Inhale 2 puffs into the lungs every 6 (six) hours as needed for wheezing or shortness of breath.    [provider]  amLODipine (NORVASC) 10 MG tablet Take 10 mg by mouth daily.  12/29/13   [provider]  aspirin 81 MG chewable tablet Chew 1 tablet (81 mg total) by mouth daily. 09/30/19   Danford, Suann Larry, MD  calcium acetate (PHOSLO) 667 MG capsule Take 1,334 mg by mouth 3 (three) times daily with meals.     [provider]  carvedilol (COREG) 12.5 MG tablet Take 12.5 mg by mouth  2 (two) times daily with a meal.  01/09/14   [provider]  cetirizine (ZYRTEC) 10 MG tablet Take 10 mg by mouth daily as needed for allergies.     [provider]  clopidogrel (PLAVIX) 75 MG tablet Take 1 tablet (75 mg total) by mouth daily. 01/18/18   Epifanio Lesches, MD  Fluticasone-Salmeterol (ADVAIR) 250-50 MCG/DOSE AEPB Inhale 1 puff into the lungs 2 (two) times daily as needed (for shortness of breath). 09/07/19   Henreitta Leber, MD  hydrALAZINE (APRESOLINE) 50 MG tablet Take 1 tablet (50 mg total) by mouth every 8 (eight) hours. 09/29/19   Danford, Suann Larry, MD  HYDROcodone-acetaminophen (NORCO) 7.5-325 MG tablet Take 1-2 tablets by mouth daily as needed (pain). 09/29/19   Danford, Suann Larry, MD  lidocaine-prilocaine (EMLA) cream Apply 1 application topically as needed (prior to treatment).     [provider]  losartan (COZAAR) 50 MG tablet Take 50 mg by mouth daily. 03/18/19   [provider]  omeprazole (PRILOSEC) 20 MG capsule Take 1 capsule (20 mg total) by mouth 2 (two) times daily before a meal. 09/07/19   Sainani, Belia Heman, MD  ondansetron (ZOFRAN) 4 MG tablet Take 4 mg by mouth every 8 (eight) hours as needed. 05/20/19   [provider]  oxybutynin (DITROPAN XL) 15 MG 24 hr tablet Take 15 mg by mouth daily. 12/24/17   [provider]  pravastatin (PRAVACHOL) 40 MG tablet Take 40 mg by mouth at bedtime.     [provider]  topiramate (TOPAMAX) 25 MG tablet Take 1 tablet (25 mg total) by mouth as needed (for headaches). 09/07/19   Henreitta Leber, MD  torsemide (DEMADEX) 20 MG tablet Take 2 tablets (40 mg total) by mouth every Monday,Wednesday,Friday, and Sunday at 6 PM. 10/01/19   Edwin Dada, MD    Physical Exam: Vitals:   10/01/19 1730 10/01/19 1800 10/01/19 1830 10/01/19 1900  BP: (!) 121/49 (!) 145/48 (!) 142/54 (!) 158/52  Pulse: 73 73 66 72  Resp: (!) 22 (!) 23 18 19   Temp:      TempSrc:      SpO2: 96% 98% 97% 99%  Weight:      Height:        Constitutional: Obese woman resting supine in bed, currently on BiPAP Eyes: PERRL, lids and conjunctivae normal ENMT: Limited as patient currently wearing BiPAP Neck: normal, supple, no masses. Respiratory: Currently on BiPAP, coarse breath sounds anteriorly Cardiovascular: Regular rate and rhythm, no murmurs / rubs / gallops. No extremity edema. 2+ pedal pulses.  LUE aVF with palpable thrill. Abdomen: no tenderness, no masses palpated. No hepatosplenomegaly. Bowel sounds positive.  Musculoskeletal: no clubbing / cyanosis. No joint deformity upper and lower extremities. Good ROM, no contractures. Normal muscle tone.  Skin: no rashes, lesions, ulcers. No induration Neurologic: CN 2-12 grossly intact. Sensation intact, Strength 5/5 in all 4.  Psychiatric: Alert and oriented x 3. Normal mood.    Labs on Admission: I have personally reviewed following labs and imaging  studies  CBC: Recent Labs  Lab 09/25/19 0547 09/26/19 0335 09/30/19 0839 10/01/19 1650  WBC 5.3 4.4 4.0 9.4  NEUTROABS 4.2 3.1  --  6.8  HGB 8.6* 8.2* 8.3* 9.7*  HCT 26.5* 25.7* 24.9* 30.0*  MCV 90.1 90.2 88.9 91.2  PLT 108* 119* 170 [id number]   Basic Metabolic Panel: Recent Labs  Lab 09/25/19 0547 09/25/19 1337 09/26/19 0335 09/27/19 0443 09/27/19 1919 09/28/19 0504 09/28/19  1807 09/29/19 0830 09/30/19 0839 10/01/19 1650  NA 139  --  138  --   --  139  --  136 134* 136  K 3.7  --  3.0*  --   --  4.1  --  4.3 4.8 4.7  CL 96*  --  99  --   --  99  --  97* 95* 95*  CO2 28  --  29  --   --  31  --  27 27 28   GLUCOSE 102*  --  97  --   --  99  --  110* 103* 167*  BUN 29*  --  20  --   --  16  --  26* 38* 31*  CREATININE 4.26*  --  2.75* 4.37*  --  2.62*  --  4.49* 5.91* 4.63*  CALCIUM 8.2*  --  7.6*  --   --  8.2*  --  8.6* 8.6* 8.8*  MG 1.9  --  1.6*  --   --  2.1  --   --   --   --   PHOS  --  1.4* 3.7  --  1.2*  --  3.1  --  5.4*  --    GFR: Estimated Creatinine Clearance: 9.4 mL/min (A) (by C-G formula based on SCr of 4.63 mg/dL (H)). Liver Function Tests: Recent Labs  Lab 09/30/19 0839 10/01/19 1650  AST  --  14*  ALT  --  9  ALKPHOS  --  59  BILITOT  --  0.7  PROT  --  6.4*  ALBUMIN 3.6 3.6   Recent Labs  Lab 10/01/19 1650  LIPASE 22   No results for input(s): AMMONIA in the last 168 hours. Coagulation Profile: No results for input(s): INR, PROTIME in the last 168 hours. Cardiac Enzymes: No results for input(s): CKTOTAL, CKMB, CKMBINDEX, TROPONINI in the last 168 hours. BNP (last 3 results) No results for input(s): PROBNP in the last 8760 hours. HbA1C: No results for input(s): HGBA1C in the last 72 hours. CBG: Recent Labs  Lab 09/29/19 1802 09/29/19 2114 09/30/19 0820 09/30/19 1157 10/01/19 1646  GLUCAP 109* 127* 94 89 169*   Lipid Profile: No results for input(s): CHOL, HDL, LDLCALC, TRIG, CHOLHDL, LDLDIRECT in the last 72 hours. Thyroid  Function Tests: No results for input(s): TSH, T4TOTAL, FREET4, T3FREE, THYROIDAB in the last 72 hours. Anemia Panel: No results for input(s): VITAMINB12, FOLATE, FERRITIN, TIBC, IRON, RETICCTPCT in the last 72 hours. Urine analysis:    Component Value Date/Time   COLORURINE YELLOW (A) 11/28/2018 2146   APPEARANCEUR CLEAR (A) 11/28/2018 2146   LABSPEC 1.012 11/28/2018 2146   PHURINE 8.0 11/28/2018 2146   GLUCOSEU 50 (A) 11/28/2018 2146   HGBUR NEGATIVE 11/28/2018 2146   BILIRUBINUR NEGATIVE 11/28/2018 2146   KETONESUR 5 (A) 11/28/2018 2146   PROTEINUR >=300 (A) 11/28/2018 2146   NITRITE NEGATIVE 11/28/2018 2146   LEUKOCYTESUR NEGATIVE 11/28/2018 2146    Radiological Exams on Admission: Dg Chest 1 View  Result Date: 10/01/2019 CLINICAL DATA:  Shortness of breath, respiratory distress EXAM: CHEST  1 VIEW COMPARISON:  09/24/2019 FINDINGS: Multifocal interstitial/airspace opacities, lower lobe predominant. This appearance is new/progressive from the prior. No pleural effusion or pneumothorax. The heart is normal in size.  Thoracic aortic atherosclerosis. Degenerative changes of the visualized thoracolumbar spine. IMPRESSION: Multifocal interstitial/airspace opacities, possibly reflecting interstitial edema given the clinical history. No associated pleural effusions. Technically speaking, in the appropriate clinical setting, atypical/viral pneumonia (  including COVID) is within the differential. Electronically Signed   By: Julian Hy M.D.   On: 10/01/2019 17:30    EKG: Independently reviewed. Sinus rhythm, nonspecific T wave changes lateral leads.  Assessment/Plan Principal Problem:   Acute on chronic respiratory failure with hypoxia (HCC) Active Problems:   End stage renal disease on dialysis ([place])   Type 2 diabetes mellitus with other specified complication (HCC)   Acute on chronic diastolic CHF (congestive heart failure) (HCC)   Hyperlipidemia   HTN (hypertension)  NATACIA JUILLERAT is a 73 y.o. female with medical history significant for ESRD on TTS HD, HFpEF, COPD, moderate aortic stenosis, type 2 diabetes, hypertension, and hyperlipidemia who is admitted with acute on chronic respiratory failure with hypoxia.   Acute on chronic respiratory failure with hypoxia Acute on chronic diastolic CHF: Suspected due to acute CHF/pulmonary edema.  Procalcitonin is downtrending. SARS-CoV-2 test is currently pending.  She has been placed on BiPAP with interval improvement. -Admit to stepdown unit, use BiPAP as needed -Monitor daily weights, strict I/O's -Nephrology to see, will consider dialysis tonight versus tomorrow  ESRD on TTS HD: Nephrology consulted, appreciate further assistance with dialysis needs.  COPD: Continue BiPAP as needed as above.  Continue Dulera and albuterol as needed.  Aortic stenosis: Moderate aortic stenosis as seen on echo 09/21/2019.  Type 2 diabetes: Continue sensitive SSI.  Hypertension: Resume home Coreg, amlodipine, hydralazine.  Hyperlipidemia: Continue pravastatin.  DVT prophylaxis: Subcutaneous heparin Code Status: Full code, confirmed with patient Family Communication: None present on admission. Disposition Plan: Pending clinical progress Consults called: Nephrology Admission status: Admit - It is my clinical opinion that admission to INPATIENT is reasonable and necessary because of the expectation that this patient will require hospital care that crosses at least 2 midnights to treat this condition based on the medical complexity of the problems presented.  Given the aforementioned information, the predictability of an adverse outcome is felt to be significant.      Zada Finders MD [facility]  If 7PM-7AM, please contact night-coverage [email or web address]  10/01/2019, 8:30 PM

## 2019-10-01 NOTE — ED Notes (Signed)
First attempt to call report, spoke with Casey Burkitt, unable to receive patient at present time, will call when ready to take report. Charge nurse aware.

## 2019-10-01 NOTE — Progress Notes (Signed)
CODE SEPSIS - PHARMACY COMMUNICATION  **Broad Spectrum Antibiotics should be administered within 1 hour of Sepsis diagnosis**  Time Code Sepsis Called/Page Received: 1743  Antibiotics Ordered: cefepime and vancomycin  Time of 1st antibiotic administration: 1807  Additional action taken by pharmacy: [place] Resident 10/01/2019  6:24 PM

## 2019-10-01 NOTE — Consult Note (Signed)
PHARMACY -  BRIEF ANTIBIOTIC NOTE   Pharmacy has received consult(s) for vancomycin and cefepime from an ED provider. The patient's profile has been reviewed for ht/wt/allergies/indication/available labs.    One time order(s) placed for  -cefepime 2 g already ordered -vancomycin 1 g already ordered  Further antibiotics/pharmacy consults should be ordered by admitting physician if indicated.                       Thank you, [place] Resident 10/01/2019  5:55 PM

## 2019-10-01 NOTE — Progress Notes (Signed)
[facility]  ROUNDING NOTE   Subjective:   [name] was discharged yesterday. She had dialysis yesterday with ultrafiltration of 1 liter. Patient presents this evening with shortness of breath and pulmonary edema. Placed on BPAP and going to step down unit.   Objective:  Vital signs in last 24 hours:  Temp:  [99.6 F (37.6 C)] 99.6 F (37.6 C) (11/11 1657) Pulse Rate:  [66-96] 72 (11/11 1900) Resp:  [18-39] 19 (11/11 1900) BP: (121-158)/(48-59) 158/52 (11/11 1900) SpO2:  [96 %-99 %] 99 % (11/11 1900) FiO2 (%):  [50 %] 50 % (11/11 1649) Weight:  [63 kg] 63 kg (11/11 1647)  Weight change:  Filed Weights   10/01/19 1647  Weight: 63 kg    Intake/Output: No intake/output data recorded.   Intake/Output this shift:  Total I/O In: 500 [IV Piggyback:500] Out: -   Physical Exam: General: ill  Head: +BIPAP  Eyes: PERRL  Neck: Supple, trachea midline  Lungs:   Coarse breath sounds bilaterally  Heart: Regular rate and rhythm  Abdomen:  Soft, nontender,   Extremities:  no peripheral edema.  Neurologic: Nonfocal, moving all four extremities  Skin: No lesions  Access: Left AVF    Basic Metabolic Panel: Recent Labs  Lab 09/25/19 0547 09/25/19 1337 09/26/19 0335 09/27/19 0443 09/27/19 1919 09/28/19 0504 09/28/19 1807 09/29/19 0830 09/30/19 0839 10/01/19 1650  NA 139  --  138  --   --  139  --  136 134* 136  K 3.7  --  3.0*  --   --  4.1  --  4.3 4.8 4.7  CL 96*  --  99  --   --  99  --  97* 95* 95*  CO2 28  --  29  --   --  31  --  27 27 28   GLUCOSE 102*  --  97  --   --  99  --  110* 103* 167*  BUN 29*  --  20  --   --  16  --  26* 38* 31*  CREATININE 4.26*  --  2.75* 4.37*  --  2.62*  --  4.49* 5.91* 4.63*  CALCIUM 8.2*  --  7.6*  --   --  8.2*  --  8.6* 8.6* 8.8*  MG 1.9  --  1.6*  --   --  2.1  --   --   --   --   PHOS  --  1.4* 3.7  --  1.2*  --  3.1  --  5.4*  --     Liver Function Tests: Recent Labs  Lab 09/30/19 0839  10/01/19 1650  AST  --  14*  ALT  --  9  ALKPHOS  --  59  BILITOT  --  0.7  PROT  --  6.4*  ALBUMIN 3.6 3.6   Recent Labs  Lab 10/01/19 1650  LIPASE 22   No results for input(s): AMMONIA in the last 168 hours.  CBC: Recent Labs  Lab 09/25/19 0547 09/26/19 0335 09/30/19 0839 10/01/19 1650  WBC 5.3 4.4 4.0 9.4  NEUTROABS 4.2 3.1  --  6.8  HGB 8.6* 8.2* 8.3* 9.7*  HCT 26.5* 25.7* 24.9* 30.0*  MCV 90.1 90.2 88.9 91.2  PLT 108* 119* 170 300    Cardiac Enzymes: No results for input(s): CKTOTAL, CKMB, CKMBINDEX, TROPONINI in the last 168 hours.  BNP: Invalid input(s): POCBNP  CBG: Recent Labs  Lab 09/29/19 1802 09/29/19  2114 09/30/19 0820 09/30/19 1157 10/01/19 1646  GLUCAP 109* 127* 94 89 169*    Microbiology: Results for orders placed or performed during the hospital encounter of 09/20/19  Culture, blood (routine x 2)     Status: None   Collection Time: 09/20/19  5:26 AM   Specimen: BLOOD  Result Value Ref Range Status   Specimen Description BLOOD RIGHT FOREARM  Final   Special Requests   Final    BOTTLES DRAWN AEROBIC AND ANAEROBIC Blood Culture adequate volume   Culture   Final    NO GROWTH 5 DAYS Performed at [facility], [address], [place], [place] [address]    Report Status 09/25/2019 FINAL  Final  Culture, blood (routine x 2)     Status: None   Collection Time: 09/20/19  5:26 AM   Specimen: BLOOD  Result Value Ref Range Status   Specimen Description BLOOD RIGHT HAND  Final   Special Requests   Final    BOTTLES DRAWN AEROBIC AND ANAEROBIC Blood Culture adequate volume   Culture   Final    NO GROWTH 5 DAYS Performed at [facility], [address], [place], [place] [address]    Report Status 09/25/2019 FINAL  Final  SARS Coronavirus 2 by RT PCR (hospital order, performed in [place] hospital lab) Nasopharyngeal Nasopharyngeal Swab     Status: None   Collection Time: 09/20/19  5:26 AM   Specimen: Nasopharyngeal  Swab  Result Value Ref Range Status   SARS Coronavirus 2 NEGATIVE NEGATIVE Final    Comment: (NOTE) If result is NEGATIVE SARS-CoV-2 target nucleic acids are NOT DETECTED. The SARS-CoV-2 RNA is generally detectable in upper and lower  respiratory specimens during the acute phase of infection. The lowest  concentration of SARS-CoV-2 viral copies this assay can detect is 250  copies / mL. A negative result does not preclude SARS-CoV-2 infection  and should not be used as the sole basis for treatment or other  patient management decisions.  A negative result may occur with  improper specimen collection / handling, submission of specimen other  than nasopharyngeal swab, presence of viral mutation(s) within the  areas targeted by this assay, and inadequate number of viral copies  (<250 copies / mL). A negative result must be combined with clinical  observations, patient history, and epidemiological information. If result is POSITIVE SARS-CoV-2 target nucleic acids are DETECTED. The SARS-CoV-2 RNA is generally detectable in upper and lower  respiratory specimens dur ing the acute phase of infection.  Positive  results are indicative of active infection with SARS-CoV-2.  Clinical  correlation with patient history and other diagnostic information is  necessary to determine patient infection status.  Positive results do  not rule out bacterial infection or co-infection with other viruses. If result is PRESUMPTIVE POSTIVE SARS-CoV-2 nucleic acids MAY BE PRESENT.   A presumptive positive result was obtained on the submitted specimen  and confirmed on repeat testing.  While 2019 novel coronavirus  (SARS-CoV-2) nucleic acids may be present in the submitted sample  additional confirmatory testing may be necessary for epidemiological  and / or clinical management purposes  to differentiate between  SARS-CoV-2 and other Sarbecovirus currently known to infect humans.  If clinically indicated  additional testing with an alternate test  methodology [phone]) is advised. The SARS-CoV-2 RNA is generally  detectable in upper and lower respiratory sp ecimens during the acute  phase of infection. The expected result is Negative. Fact Sheet for Patients:  StrictlyIdeas.no Fact Sheet for Healthcare Providers: BankingDealers.co.za This test is not yet approved or cleared by the [place] FDA and has been authorized for detection and/or diagnosis of SARS-CoV-2 by FDA under an Emergency Use Authorization (EUA).  This EUA will remain in effect (meaning this test can be used) for the duration of the COVID-19 declaration under Section 564(b)(1) of the Act, 21 U.S.C. section 360bbb-3(b)(1), unless the authorization is terminated or revoked sooner. Performed at [facility], [address] [place], [place] [address]   MRSA PCR Screening     Status: None   Collection Time: 09/20/19 10:12 AM   Specimen: Nasopharyngeal  Result Value Ref Range Status   MRSA by PCR NEGATIVE NEGATIVE Final    Comment:        The GeneXpert MRSA Assay (FDA approved for NASAL specimens only), is one component of a comprehensive MRSA colonization surveillance program. It is not intended to diagnose MRSA infection nor to guide or monitor treatment for MRSA infections. Performed at [facility], [address] [place], [place] [address]   CULTURE, BLOOD (ROUTINE X 2) w Reflex to ID Panel     Status: None   Collection Time: 09/22/19  6:30 PM   Specimen: BLOOD  Result Value Ref Range Status   Specimen Description BLOOD BLOOD LEFT HAND  Final   Special Requests   Final    BOTTLES DRAWN AEROBIC ONLY Blood Culture results may not be optimal due to an inadequate volume of blood received in culture bottles   Culture   Final    NO GROWTH 8 DAYS Performed at [facility], [address] [place], [place] [address]    Report Status  09/30/2019 FINAL  Final  CULTURE, BLOOD (ROUTINE X 2) w Reflex to ID Panel     Status: None   Collection Time: 09/22/19  6:53 PM   Specimen: BLOOD  Result Value Ref Range Status   Specimen Description BLOOD BLOOD RIGHT HAND  Final   Special Requests   Final    BOTTLES DRAWN AEROBIC AND ANAEROBIC Blood Culture adequate volume   Culture   Final    NO GROWTH 8 DAYS Performed at [facility], [address] [place], [place] [address]    Report Status 09/30/2019 FINAL  Final    Coagulation Studies: No results for input(s): LABPROT, INR in the last 72 hours.  Urinalysis: No results for input(s): COLORURINE, LABSPEC, PHURINE, GLUCOSEU, HGBUR, BILIRUBINUR, KETONESUR, PROTEINUR, UROBILINOGEN, NITRITE, LEUKOCYTESUR in the last 72 hours.  Invalid input(s): APPERANCEUR    Imaging: Dg Chest 1 View  Result Date: 10/01/2019 CLINICAL DATA:  Shortness of breath, respiratory distress EXAM: CHEST  1 VIEW COMPARISON:  09/24/2019 FINDINGS: Multifocal interstitial/airspace opacities, lower lobe predominant. This appearance is new/progressive from the prior. No pleural effusion or pneumothorax. The heart is normal in size.  Thoracic aortic atherosclerosis. Degenerative changes of the visualized thoracolumbar spine. IMPRESSION: Multifocal interstitial/airspace opacities, possibly reflecting interstitial edema given the clinical history. No associated pleural effusions. Technically speaking, in the appropriate clinical setting, atypical/viral pneumonia (including COVID) is within the differential. Electronically Signed   By: Julian Hy M.D.   On: 10/01/2019 17:30     Medications:    . [START ON 10/02/2019] amLODipine  10 mg Oral Daily  . aspirin  81 mg Oral Daily  . [START ON 10/02/2019] carvedilol  12.5 mg Oral BID WC  . [START ON 10/02/2019] clopidogrel  75 mg Oral Daily  . heparin  5,000  Units Subcutaneous Q8H  . [START ON 10/02/2019] hydrALAZINE  50 mg Oral Q8H  . insulin aspart   0-9 Units Subcutaneous Q4H  . mometasone-formoterol  2 puff Inhalation BID  . pravastatin  40 mg Oral QHS  . sodium chloride flush  3 mL Intravenous Q12H   acetaminophen **OR** acetaminophen, albuterol, ondansetron **OR** ondansetron (ZOFRAN) IV  Assessment/ Plan:  [name] is a 73 y.o. white female with end stage renal disease on hemodialysis, hypertension, diabetes mellitus type II, overactive bladder, gout, COPD, congestive heart failure, coronary artery disease, aortic atherosclerosis admitted to [facility] on 10/01/2019 for Breathing Difficulty  [facility] TTS 60kg  #. ESRD: No acute indication for dialysis at this time. Breathing comfortably on BIPAP - Continue TTS schedule. Dialysis tomorrow morning.   #. Anemia of CKD:   - EPO with HD treatment  #. SHPTH - holding binders due to low phosphorus level.   # Hypertension: home regimen of losartan, torsemide, carvedilol, hydralazine.    LOS: 0 Mannat Benedetti 11/11/20208:57 PM

## 2019-10-01 NOTE — ED Notes (Signed)
ED TO INPATIENT HANDOFF REPORT  ED Nurse Name and Phone #:    S Name/Age/Gender Phyllis [name] 73 y.o. female Room/Bed: ED15A/ED15A  Code Status   Code Status: Full Code  Home/SNF/Other Home Patient oriented to: self, place, time and situation Is this baseline? Yes   Triage Complete: Triage complete  Chief Complaint Breathing Difficulty  Triage Note Pt presents to ED via [facility] with c/o respiratory distress. Per EMS respiratory distress started approx 52mins ago. EMS reports RA sats 77%, lows 90's on NRB, 97% on CPAP. Per EMS hx of COPD and CHF. Reports recently D/C and was intubated during last admission.   157/73 97% on CPAP 40RR 70HR  Upon arrival to ED lung sounds with rhonchi, pt is alert with labored respiratory effort.    Allergies Allergies  Allergen Reactions  . Enalapril Maleate Other (See Comments)    Other reaction(s): Headache  . Nitrofurantoin Swelling and Rash    Other Reaction: swelling of body  . Sulfamethoxazole-Trimethoprim Swelling  . 2,4-D Dimethylamine (Amisol) Rash and Other (See Comments)    Other Reaction: h/a  . Baclofen Other (See Comments) and Nausea Only    lightheadness ,drowsiness , muscle weakness , twitching in hands   . Neosporin [Neomycin-Bacitracin Zn-Polymyx] Other (See Comments) and Rash    Other Reaction: irritation Skin irritation   . Quinine Nausea And Vomiting, Rash and Other (See Comments)    Other Reaction: Vomiting, rash, h/a, vision  . Ultram [Tramadol] Palpitations  . Zocor [Simvastatin] Other (See Comments) and Rash    Other Reaction: muscle spasms Muscle pain and spasms  . Bactrim [Sulfamethoxazole-Trimethoprim] Swelling  . Levodopa Other (See Comments)    Reaction: unknown  . Macrodantin [Nitrofurantoin Macrocrystal] Swelling  . Quinine Derivatives Other (See Comments)    Vertigo,nausea vomiting blurred vision headache ears sensitivity     Level of Care/Admitting Diagnosis ED Disposition    ED  Disposition Condition [place]: [place] [[id number]]  Level of Care: Stepdown [14]  Covid Evaluation: Person Under Investigation (PUI)  Diagnosis: Acute on chronic respiratory failure with [id number]  Admitting Physician: Lenore Cordia [id number]  Attending Physician: Lenore Cordia [id number]  Estimated length of stay: past midnight tomorrow  Certification:: I certify this patient will need inpatient services for at least 2 midnights  PT Class (Do Not Modify): Inpatient [101]  PT Acc Code (Do Not Modify): Private [1]       B Medical/Surgery History Past Medical History:  Diagnosis Date  . (HFpEF) heart failure with preserved ejection fraction ([place])    a. TTE 01/2014: nl LV sys fxn, no valvular abnormalities; b. TTE 11/16: nl EF, mild LVH;  c. 04/2019 Echo: EF 60-65%. DD. Nl RV fxn. Mod AS. 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  . Allergy   . Anemia of chronic disease   . Anxiety   . Aortic atherosclerosis ([place])   . Asthma   . Chronic back pain   . COPD (chronic obstructive pulmonary disease) ([place])   . Diabetes mellitus with complication ([place])   . ESRD on hemodialysis ([place])    a. Tues/Sat; b. 2/2 small kidneys  . Essential hypertension   . Fistula    lower left arm  . GERD (gastroesophageal reflux disease)   . Gout   . History of exercise stress test    a. 01/2014: no evidence of ischemia; b. Lexiscan 08/2015: no sig ischemia, severe GI uptake artifact, low  risk; c. CPET @ [facility] 09/2016: exercised 3 min 12 sec on bike without incline, 2.28 METs, VO2 of 8.1, 48% of predicted, indicating mod to sev functional impairment, evidence of blunted HR, stroke volume, and BP augmentation as well as ventilation-perfusion mismatch with exercise  . HLD (hyperlipidemia)   . Moderate aortic stenosis    a. 04/2019 Echo: Mod AS.  . Non-obstructive Carotid arterial disease ([place])    a. 12/2017: <50% bilat ICA dzs.  Marland Kitchen Permanent  central venous catheter in place    right chest  . Sleep apnea    Past Surgical History:  Procedure Laterality Date  . carpel tunnel    . GALLBLADDER SURGERY    . PERIPHERAL VASCULAR CATHETERIZATION N/A 04/12/2015   Procedure: A/V Shuntogram/Fistulagram;  Surgeon: Algernon Huxley, MD;  Location: [place] [facility];  Service: Cardiovascular;  Laterality: N/A;  . PERIPHERAL VASCULAR CATHETERIZATION N/A 04/12/2015   Procedure: A/V Shunt Intervention;  Surgeon: Algernon Huxley, MD;  Location: [place] [facility];  Service: Cardiovascular;  Laterality: N/A;  . PERIPHERAL VASCULAR CATHETERIZATION N/A 06/09/2015   Procedure: Dialysis/Perma Catheter Removal;  Surgeon: Katha Cabal, MD;  Location: [place] [facility];  Service: Cardiovascular;  Laterality: N/A;     A IV Location/Drains/Wounds Patient Lines/Drains/Airways Status   Active Line/Drains/Airways    Name:   Placement date:   Placement time:   Site:   Days:   Peripheral IV 10/01/19 Right Antecubital   10/01/19    1650    Antecubital   less than 1   Peripheral IV 10/01/19 Right Hand   10/01/19    1651    Hand   less than 1   Fistula / Graft Left Forearm Arteriovenous fistula   -    -    Forearm      External Urinary Catheter   10/01/19    1724    -   less than 1          Intake/Output Last 24 hours  Intake/Output Summary (Last 24 hours) at 10/01/2019 2056 Last data filed at 10/01/2019 1922 Gross per 24 hour  Intake 500 ml  Output -  Net 500 ml    Labs/Imaging Results for orders placed or performed during the hospital encounter of 10/01/19 (from the past 48 hour(s))  Glucose, capillary     Status: Abnormal   Collection Time: 10/01/19  4:46 PM  Result Value Ref Range   Glucose-Capillary 169 (H) 70 - 99 mg/dL   Comment 1 Notify RN   CBC with Differential     Status: Abnormal   Collection Time: 10/01/19  4:50 PM  Result Value Ref Range   WBC 9.4 4.0 - 10.5 K/uL   RBC 3.29 (L) 3.87 - 5.11 MIL/uL   Hemoglobin 9.7 (L) 12.0 -  15.0 g/dL   HCT 30.0 (L) 36.0 - 46.0 %   MCV 91.2 80.0 - 100.0 fL   MCH 29.5 26.0 - 34.0 pg   MCHC 32.3 30.0 - 36.0 g/dL   RDW 14.7 11.5 - 15.5 %   Platelets 300 150 - 400 K/uL   nRBC 0.0 0.0 - 0.2 %   Neutrophils Relative % 72 %   Neutro Abs 6.8 1.7 - 7.7 K/uL   Lymphocytes Relative 14 %   Lymphs Abs 1.3 0.7 - 4.0 K/uL   Monocytes Relative 7 %   Monocytes Absolute 0.7 0.1 - 1.0 K/uL   Eosinophils Relative 5 %   Eosinophils Absolute 0.5 0.0 -  0.5 K/uL   Basophils Relative 1 %   Basophils Absolute 0.1 0.0 - 0.1 K/uL   Immature Granulocytes 1 %   Abs Immature Granulocytes 0.10 (H) 0.00 - 0.07 K/uL    Comment: Performed at [facility], [address] [place], [place] [address]  Comprehensive metabolic panel     Status: Abnormal   Collection Time: 10/01/19  4:50 PM  Result Value Ref Range   Sodium 136 135 - 145 mmol/L   Potassium 4.7 3.5 - 5.1 mmol/L   Chloride 95 (L) 98 - 111 mmol/L   CO2 28 22 - 32 mmol/L   Glucose, Bld 167 (H) 70 - 99 mg/dL   BUN 31 (H) 8 - 23 mg/dL   Creatinine, Ser 4.63 (H) 0.44 - 1.00 mg/dL   Calcium 8.8 (L) 8.9 - 10.3 mg/dL   Total Protein 6.4 (L) 6.5 - 8.1 g/dL   Albumin 3.6 3.5 - 5.0 g/dL   AST 14 (L) 15 - 41 U/L   ALT 9 0 - 44 U/L   Alkaline Phosphatase 59 38 - 126 U/L   Total Bilirubin 0.7 0.3 - 1.2 mg/dL   GFR calc non Af Amer 9 (L) >60 mL/min   GFR calc Af Amer 10 (L) >60 mL/min   Anion gap 13 5 - 15    Comment: Performed at [facility], [address] [place], [place] [address]  Lipase, blood     Status: None   Collection Time: 10/01/19  4:50 PM  Result Value Ref Range   Lipase 22 11 - 51 U/L    Comment: Performed at [facility], [place], [place] [address]  Troponin I (High Sensitivity)     Status: Abnormal   Collection Time: 10/01/19  4:50 PM  Result Value Ref Range   Troponin I (High Sensitivity) 23 (H) <18 ng/L    Comment: (NOTE) Elevated high sensitivity troponin I (hsTnI) values and  significant  changes across serial measurements may suggest ACS but many other  chronic and acute conditions are known to elevate hsTnI results.  Refer to the "Links" section for chest pain algorithms and additional  guidance. Performed at [facility], [address] [place], [place] [address]   Brain natriuretic peptide     Status: Abnormal   Collection Time: 10/01/19  4:50 PM  Result Value Ref Range   B Natriuretic Peptide 1,854.0 (H) 0.0 - 100.0 pg/mL    Comment: Performed at [facility], [address] [place], [place] [address]  Procalcitonin - Baseline     Status: None   Collection Time: 10/01/19  4:50 PM  Result Value Ref Range   Procalcitonin 0.92 ng/mL    Comment:        Interpretation: PCT > 0.5 ng/mL and <= 2 ng/mL: Systemic infection (sepsis) is possible, but other conditions are known to elevate PCT as well. (NOTE)       Sepsis PCT Algorithm           Lower Respiratory Tract                                      Infection PCT Algorithm    ----------------------------     ----------------------------         PCT < 0.25 ng/mL                PCT <  0.10 ng/mL         Strongly encourage             Strongly discourage   discontinuation of antibiotics    initiation of antibiotics    ----------------------------     -----------------------------       PCT 0.25 - 0.50 ng/mL            PCT 0.10 - 0.25 ng/mL               OR       >80% decrease in PCT            Discourage initiation of                                            antibiotics      Encourage discontinuation           of antibiotics    ----------------------------     -----------------------------         PCT >= 0.50 ng/mL              PCT 0.26 - 0.50 ng/mL                AND       <80% decrease in PCT             Encourage initiation of                                             antibiotics       Encourage continuation           of antibiotics    ----------------------------      -----------------------------        PCT >= 0.50 ng/mL                  PCT > 0.50 ng/mL               AND         increase in PCT                  Strongly encourage                                      initiation of antibiotics    Strongly encourage escalation           of antibiotics                                     -----------------------------                                           PCT <= 0.25 ng/mL                                                 OR                                        >  80% decrease in PCT                                     Discontinue / Do not initiate                                             antibiotics Performed at [facility], [address] [place], [place] [address]   Blood gas, venous     Status: Abnormal   Collection Time: 10/01/19  4:57 PM  Result Value Ref Range   pH, Ven 7.44 (H) 7.250 - 7.430   pCO2, Ven 43 (L) 44.0 - 60.0 mmHg   pO2, Ven 130.0 (H) 32.0 - 45.0 mmHg   Bicarbonate 29.2 (H) 20.0 - 28.0 mmol/L   Acid-Base Excess 4.4 (H) 0.0 - 2.0 mmol/L   O2 Saturation 99.1 %   Patient temperature 37.0    Collection site VEIN    Sample type VENIPUNCTURE     Comment: Performed at [facility], [address], [place], [place] [address]  Lactic acid, plasma     Status: None   Collection Time: 10/01/19  6:36 PM  Result Value Ref Range   Lactic Acid, Venous 0.7 0.5 - 1.9 mmol/L    Comment: Performed at [facility], [place], [place] [address]  Troponin I (High Sensitivity)     Status: Abnormal   Collection Time: 10/01/19  6:36 PM  Result Value Ref Range   Troponin I (High Sensitivity) 26 (H) <18 ng/L    Comment: (NOTE) Elevated high sensitivity troponin I (hsTnI) values and significant  changes across serial measurements may suggest ACS but many other  chronic and acute conditions are known to elevate hsTnI results.  Refer to the "Links" section for chest pain algorithms and additional   guidance. Performed at [facility], [address] [place], [place] [address]   Lactic acid, plasma     Status: None   Collection Time: 10/01/19  7:23 PM  Result Value Ref Range   Lactic Acid, Venous 0.7 0.5 - 1.9 mmol/L    Comment: Performed at [facility], [address] [place], [place] [address]   Dg Chest 1 View  Result Date: 10/01/2019 CLINICAL DATA:  Shortness of breath, respiratory distress EXAM: CHEST  1 VIEW COMPARISON:  09/24/2019 FINDINGS: Multifocal interstitial/airspace opacities, lower lobe predominant. This appearance is new/progressive from the prior. No pleural effusion or pneumothorax. The heart is normal in size.  Thoracic aortic atherosclerosis. Degenerative changes of the visualized thoracolumbar spine. IMPRESSION: Multifocal interstitial/airspace opacities, possibly reflecting interstitial edema given the clinical history. No associated pleural effusions. Technically speaking, in the appropriate clinical setting, atypical/viral pneumonia (including COVID) is within the differential. Electronically Signed   By: Julian Hy M.D.   On: 10/01/2019 17:30    Pending Labs Unresulted Labs (From admission, onward)    Start     Ordered   10/02/19 0500  Procalcitonin  Daily,   STAT     10/01/19 1652   10/02/19 0500  Magnesium  Tomorrow morning,   STAT     10/01/19 1906   10/02/19 0500  CBC  Tomorrow morning,   STAT     10/01/19 1906   10/02/19 0500  Renal function panel  Tomorrow morning,  STAT     10/01/19 1906   10/01/19 1749  Urine culture  ONCE - STAT,   STAT     10/01/19 1748   10/01/19 1652  SARS CORONAVIRUS 2 (TAT 6-24 HRS) Nasopharyngeal Nasopharyngeal Swab  (Asymptomatic/Tier 2 Patients Labs)  Once,   STAT    Question Answer Comment  Is this test for diagnosis or screening Screening   Symptomatic for COVID-19 as defined by CDC No   Hospitalized for COVID-19 No   Admitted to ICU for COVID-19 No   Previously tested for COVID-19  Yes   Resident in a congregate (group) care setting No   Employed in healthcare setting No   Pregnant No      10/01/19 1652   Signed and Held  Renal function panel  Once,   R     Signed and Held   Signed and Held  CBC  Once,   R     Signed and Held          Vitals/Pain Today's Vitals   10/01/19 1800 10/01/19 1830 10/01/19 1900 10/01/19 2000  BP: (!) 145/48 (!) 142/54 (!) 158/52   Pulse: 73 66 72   Resp: (!) 23 18 19    Temp:      TempSrc:      SpO2: 98% 97% 99%   Weight:      Height:      PainSc:    0-No pain    Isolation Precautions Airborne and Contact precautions  Medications Medications  heparin injection 5,000 Units (has no administration in time range)  sodium chloride flush (NS) 0.9 % injection 3 mL (has no administration in time range)  acetaminophen (TYLENOL) tablet 650 mg (has no administration in time range)    Or  acetaminophen (TYLENOL) suppository 650 mg (has no administration in time range)  ondansetron (ZOFRAN) tablet 4 mg (has no administration in time range)    Or  ondansetron (ZOFRAN) injection 4 mg (has no administration in time range)  albuterol (VENTOLIN HFA) 108 (90 Base) MCG/ACT inhaler 2 puff (has no administration in time range)  amLODipine (NORVASC) tablet 10 mg (has no administration in time range)  aspirin chewable tablet 81 mg (81 mg Oral Given 10/01/19 1936)  carvedilol (COREG) tablet 12.5 mg (has no administration in time range)  clopidogrel (PLAVIX) tablet 75 mg (has no administration in time range)  mometasone-formoterol (DULERA) 200-5 MCG/ACT inhaler 2 puff (has no administration in time range)  pravastatin (PRAVACHOL) tablet 40 mg (has no administration in time range)  hydrALAZINE (APRESOLINE) tablet 50 mg (has no administration in time range)  insulin aspart (novoLOG) injection 0-9 Units (2 Units Subcutaneous Given 10/01/19 2025)  ipratropium-albuterol (DUONEB) 0.5-2.5 (3) MG/3ML nebulizer solution 3 mL (3 mLs Nebulization Given  10/01/19 1757)  methylPREDNISolone sodium succinate (SOLU-MEDROL) 125 mg/2 mL injection 125 mg (125 mg Intravenous Given 10/01/19 1753)  vancomycin (VANCOCIN) IVPB 1000 mg/200 mL premix (0 mg Intravenous Stopped 10/01/19 1922)  ceFEPIme (MAXIPIME) 2 g in sodium chloride 0.9 % 100 mL IVPB (0 g Intravenous Stopped 10/01/19 1857)    Mobility walks High fall risk   Focused Assessments Renal Assessment Handoff:  Hemodialysis Schedule: Hemodialysis Schedule: Tuesday/Thursday/Saturday Last Hemodialysis date and time:    Restricted appendage:    left arm      R Recommendations: See Admitting Provider Note  Report given to:   Additional Notes:

## 2019-10-01 NOTE — ED Triage Notes (Signed)
Pt presents to ED via [facility] with c/o respiratory distress. Per EMS respiratory distress started approx 63mins ago. EMS reports RA sats 77%, lows 90's on NRB, 97% on CPAP. Per EMS hx of COPD and CHF. Reports recently D/C and was intubated during last admission.   157/73 97% on CPAP 40RR 70HR  Upon arrival to ED lung sounds with rhonchi, pt is alert with labored respiratory effort.

## 2019-10-01 NOTE — Sepsis Progress Note (Signed)
Notified provider of need to order lactic acid. ° °

## 2019-10-02 DIAGNOSIS — Z992 Dependence on renal dialysis: Secondary | ICD-10-CM

## 2019-10-02 DIAGNOSIS — R0603 Acute respiratory distress: Secondary | ICD-10-CM

## 2019-10-02 DIAGNOSIS — Z515 Encounter for palliative care: Secondary | ICD-10-CM

## 2019-10-02 DIAGNOSIS — J810 Acute pulmonary edema: Secondary | ICD-10-CM

## 2019-10-02 DIAGNOSIS — Z7189 Other specified counseling: Secondary | ICD-10-CM

## 2019-10-02 DIAGNOSIS — N186 End stage renal disease: Secondary | ICD-10-CM

## 2019-10-02 DIAGNOSIS — E1169 Type 2 diabetes mellitus with other specified complication: Secondary | ICD-10-CM

## 2019-10-02 LAB — CBC
HCT: 24 % — ABNORMAL LOW (ref 36.0–46.0)
Hemoglobin: 7.8 g/dL — ABNORMAL LOW (ref 12.0–15.0)
MCH: 29 pg (ref 26.0–34.0)
MCHC: 32.5 g/dL (ref 30.0–36.0)
MCV: 89.2 fL (ref 80.0–100.0)
Platelets: 162 10*3/uL (ref 150–400)
RBC: 2.69 MIL/uL — ABNORMAL LOW (ref 3.87–5.11)
RDW: 14.6 % (ref 11.5–15.5)
WBC: 6.1 10*3/uL (ref 4.0–10.5)
nRBC: 0 % (ref 0.0–0.2)

## 2019-10-02 LAB — RENAL FUNCTION PANEL
Albumin: 3.4 g/dL — ABNORMAL LOW (ref 3.5–5.0)
Anion gap: 13 (ref 5–15)
BUN: 40 mg/dL — ABNORMAL HIGH (ref 8–23)
CO2: 26 mmol/L (ref 22–32)
Calcium: 8.7 mg/dL — ABNORMAL LOW (ref 8.9–10.3)
Chloride: 97 mmol/L — ABNORMAL LOW (ref 98–111)
Creatinine, Ser: 5.58 mg/dL — ABNORMAL HIGH (ref 0.44–1.00)
GFR calc Af Amer: 8 mL/min — ABNORMAL LOW (ref >60)
GFR calc non Af Amer: 7 mL/min — ABNORMAL LOW (ref >60)
Glucose, Bld: 153 mg/dL — ABNORMAL HIGH (ref 70–99)
Phosphorus: 5 mg/dL — ABNORMAL HIGH (ref 2.5–4.6)
Potassium: 5.1 mmol/L (ref 3.5–5.1)
Sodium: 136 mmol/L (ref 135–145)

## 2019-10-02 LAB — MAGNESIUM: Magnesium: 2.3 mg/dL (ref 1.7–2.4)

## 2019-10-02 LAB — PROCALCITONIN: Procalcitonin: 1.23 ng/mL

## 2019-10-02 LAB — GLUCOSE, CAPILLARY
Glucose-Capillary: 139 mg/dL — ABNORMAL HIGH (ref 70–99)
Glucose-Capillary: 162 mg/dL — ABNORMAL HIGH (ref 70–99)
Glucose-Capillary: 158 mg/dL — ABNORMAL HIGH (ref 70–99)
Glucose-Capillary: 70 mg/dL (ref 70–99)

## 2019-10-02 LAB — SARS CORONAVIRUS 2 BY RT PCR (HOSPITAL ORDER, PERFORMED IN ~~LOC~~ HOSPITAL LAB): SARS Coronavirus 2: NEGATIVE

## 2019-10-02 LAB — SARS CORONAVIRUS 2 (TAT 6-24 HRS): SARS Coronavirus 2: NEGATIVE

## 2019-10-02 MED ORDER — TOPIRAMATE 25 MG PO TABS
25.0000 mg | ORAL_TABLET | ORAL | Status: DC | PRN
  Filled 2019-10-02: qty 1

## 2019-10-02 MED ORDER — PANTOPRAZOLE SODIUM 40 MG PO TBEC
40.0000 mg | DELAYED_RELEASE_TABLET | Freq: Every day | ORAL | Status: DC
  Administered 2019-10-02 – 2019-10-08 (×7): 40 mg via ORAL
  Filled 2019-10-02 (×6): qty 1

## 2019-10-02 MED ORDER — HYDROCODONE-ACETAMINOPHEN 7.5-325 MG PO TABS
1.0000 | ORAL_TABLET | Freq: Every day | ORAL | Status: DC | PRN
  Administered 2019-10-02: 1 via ORAL
  Administered 2019-10-03 – 2019-10-05 (×3): 2 via ORAL
  Filled 2019-10-02: qty 2
  Filled 2019-10-02 (×2): qty 1
  Filled 2019-10-02: qty 2

## 2019-10-02 MED ORDER — OXYBUTYNIN CHLORIDE ER 5 MG PO TB24
15.0000 mg | ORAL_TABLET | Freq: Every day | ORAL | Status: DC
  Administered 2019-10-02 – 2019-10-08 (×7): 15 mg via ORAL
  Filled 2019-10-02 (×4): qty 3
  Filled 2019-10-02: qty 1
  Filled 2019-10-02: qty 3

## 2019-10-02 MED ORDER — LORATADINE 10 MG PO TABS
10.0000 mg | ORAL_TABLET | Freq: Every day | ORAL | Status: DC
  Administered 2019-10-02 – 2019-10-08 (×7): 10 mg via ORAL
  Filled 2019-10-02 (×6): qty 1

## 2019-10-02 MED ORDER — SCOPOLAMINE 1 MG/3DAYS TD PT72
1.0000 | MEDICATED_PATCH | TRANSDERMAL | Status: DC
  Administered 2019-10-02 – 2019-10-08 (×3): 1.5 mg via TRANSDERMAL
  Filled 2019-10-02 (×3): qty 1

## 2019-10-02 MED ORDER — INSULIN ASPART 100 UNIT/ML ~~LOC~~ SOLN
0.0000 [IU] | Freq: Three times a day (TID) | SUBCUTANEOUS | Status: DC
  Administered 2019-10-04: 1 [IU] via SUBCUTANEOUS
  Administered 2019-10-06: 2 [IU] via SUBCUTANEOUS
  Administered 2019-10-07 (×2): 1 [IU] via SUBCUTANEOUS
  Administered 2019-10-07: 2 [IU] via SUBCUTANEOUS
  Filled 2019-10-02 (×5): qty 1

## 2019-10-02 NOTE — Progress Notes (Signed)
Pt transferred to Room 143.

## 2019-10-02 NOTE — Progress Notes (Signed)
Post HD:   10/02/19 1430  Vital Signs  Temp 97.7 F (36.5 C)  Temp Source Oral  Pulse Rate 85  Pulse Rate Source Monitor  Resp 16  BP (!) 134/45  BP Location Right Arm  BP Method Automatic  Patient Position (if appropriate) Lying  Post-Hemodialysis Assessment  Rinseback Volume (mL) 250 mL  KECN 42 V  Dialyzer Clearance Lightly streaked  Duration of HD Treatment -hour(s) 3 hour(s)  Hemodialysis Intake (mL) 500 mL  UF Total -Machine (mL) 2771 mL  Net UF (mL) 2271 mL  Tolerated HD Treatment Yes  AVG/AVF Arterial Site Held (minutes) 5 minutes  AVG/AVF Venous Site Held (minutes) 5 minutes  Fistula / Graft Left Forearm Arteriovenous fistula  No Placement Date or Time found.   Placed prior to admission: Yes  Orientation: Left  Access Location: Forearm  Access Type: Arteriovenous fistula  Site Condition Other (Comment) (bruises forming)  Fistula / Graft Assessment Bruit;Thrill;Present  Status Deaccessed  Drainage Description None

## 2019-10-02 NOTE — Consult Note (Signed)
Consultation Note Date: 10/02/2019   [name]  DOB: 08/21/1946  MRN: [id number]  Age / Sex: 73 y.o., female  PCP: [name], MD Referring Physician: Louellen Molder, MD  Reason for Consultation: Establishing goals of care  HPI/Patient Profile: Phyllis [name] is a 73 y.o. female with medical history significant for ESRD on TTS HD, HFpEF, COPD, moderate aortic stenosis, type 2 diabetes, hypertension, and hyperlipidemia who presents to the ED for evaluation of dyspnea. She required intubation and was felt to have a challenging fluid balance given her aortic stenosis and dialysis dependence.  Patient was recently admitted from 09/20/2019-09/30/2019 for acute respiratory failure with hypoxia felt secondary to acute CHF exacerbation/pulmonary edema.  Clinical Assessment and Goals of Care: Patient is sitting in bed. She has lived with a roommate, but her roommate just moved to a nursing facility. Patient has never been married or had children. Her brother and parents are deceased. She has a nephew, but she states he lives too far away, and she would want a friend Orbie Pyo to be her HPOA.   Functionally, she states she drives and does her ADL's. She states she is going to hire someone to come in and cook, as she is able to but not well, and to clean for her.    We discussed her diagnoses, prognosis, GOC, EOL wishes disposition and options.  A detailed discussion was had today regarding advanced directives.  Concepts specific to code status, artifical feeding and hydration, IV antibiotics and rehospitalization were discussed.  The difference between an aggressive medical intervention path and a comfort care path was discussed.  Values and goals of care important to patient and family were attempted to be elicited.  Discussed limitations of medical interventions to prolong  quality of life in some situations and discussed the concept of human mortality.  She becomes tearful during our conversation. She states she is a woman of faith, and is not afraid to die, but does not want to. She wants to live as long as possible. She states she would never want a feeding tube or to have a tracheostomy. She states she is hopeful to improve enough to have surgery for her aortic stenosis. She would want CPR. She would be amenable to living in a facility if needed, and would be amenable to having all ADL's performed for her if that were ever needed.     SUMMARY OF RECOMMENDATIONS   Full code/full scope.  Recommend palliative at D/C.    Prognosis:   Poor overall      Primary Diagnoses: Present on Admission: . Acute on chronic respiratory failure with hypoxia ([place]) . Acute on chronic diastolic CHF (congestive heart failure) ([place]) . HTN (hypertension) . Hyperlipidemia . Type 2 diabetes mellitus with other specified complication ([place])   I have reviewed the medical record, interviewed the patient and family, and examined the patient. The following aspects are pertinent.  Past Medical History:  Diagnosis Date  . (HFpEF) heart failure with preserved ejection fraction ([place])  a. TTE 01/2014: nl LV sys fxn, no valvular abnormalities; b. TTE 11/16: nl EF, mild LVH;  c. 04/2019 Echo: EF 60-65%. DD. Nl RV fxn. Mod AS. 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  . Allergy   . Anemia of chronic disease   . Anxiety   . Aortic atherosclerosis ([place])   . Asthma   . Chronic back pain   . COPD (chronic obstructive pulmonary disease) ([place])   . Diabetes mellitus with complication ([place])   . ESRD on hemodialysis ([place])    a. Tues/Sat; b. 2/2 small kidneys  . Essential hypertension   . Fistula    lower left arm  . GERD (gastroesophageal reflux disease)   . Gout   . History of exercise stress test    a. 01/2014: no evidence of ischemia; b. Lexiscan 08/2015: no sig  ischemia, severe GI uptake artifact, low risk; c. CPET @ [facility] 09/2016: exercised 3 min 12 sec on bike without incline, 2.28 METs, VO2 of 8.1, 48% of predicted, indicating mod to sev functional impairment, evidence of blunted HR, stroke volume, and BP augmentation as well as ventilation-perfusion mismatch with exercise  . HLD (hyperlipidemia)   . Moderate aortic stenosis    a. 04/2019 Echo: Mod AS.  . Non-obstructive Carotid arterial disease ([place])    a. 12/2017: <50% bilat ICA dzs.  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 Sleep apnea    Social History   Socioeconomic History  . Marital status: Single    Spouse name: Not on file  . Number of children: Not on file  . Years of education: Not on file  . Highest education level: Not on file  Occupational History  . Not on file  Social Needs  . Financial resource strain: Not on file  . Food insecurity    Worry: Not on file    Inability: Not on file  . Transportation needs    Medical: Not on file    Non-medical: Not on file  Tobacco Use  . Smoking status: Never Smoker  . Smokeless tobacco: Never Used  Substance and Sexual Activity  . Alcohol use: No  . Drug use: No  . Sexual activity: Not Currently  Lifestyle  . Physical activity    Days per week: Not on file    Minutes per session: Not on file  . Stress: Not on file  Relationships  . Social Herbalist on phone: Not on file    Gets together: Not on file    Attends religious service: Not on file    Active member of club or organization: Not on file    Attends meetings of clubs or organizations: Not on file    Relationship status: Not on file  Other Topics Concern  . Not on file  Social History Narrative  . Not on file   Family History  Problem Relation Age of Onset  . Hypertension Mother   . Hyperlipidemia Mother   . Heart disease Father   . Heart attack Father 14  . Hypertension Father   . Hyperlipidemia Father   . Heart disease  Brother        CABG   . Heart attack Brother   . Breast cancer Neg Hx    Scheduled Meds: . amLODipine  10 mg Oral Daily  . aspirin  81 mg Oral Daily  . carvedilol  12.5 mg Oral BID WC  . chlorhexidine  15 mL Mouth Rinse BID  . Chlorhexidine Gluconate Cloth  6 each Topical Daily  . clopidogrel  75 mg Oral Daily  . heparin  5,000 Units Subcutaneous Q8H  . hydrALAZINE  50 mg Oral Q8H  . insulin aspart  0-9 Units Subcutaneous Q4H  . loratadine  10 mg Oral Daily  . losartan  50 mg Oral Daily  . mouth rinse  15 mL Mouth Rinse q12n4p  . mometasone-formoterol  2 puff Inhalation BID  . oxybutynin  15 mg Oral Daily  . pantoprazole  40 mg Oral Daily  . pravastatin  40 mg Oral QHS  . scopolamine  1 patch Transdermal Q72H  . sodium chloride flush  3 mL Intravenous Q12H  . [START ON 10/03/2019] torsemide  40 mg Oral Q M,W,F,Su-1800   Continuous Infusions: . sodium chloride     PRN Meds:.sodium chloride, acetaminophen **OR** acetaminophen, albuterol, HYDROcodone-acetaminophen, ondansetron **OR** ondansetron (ZOFRAN) IV, topiramate Medications Prior to Admission:  Prior to Admission medications   Medication Sig Start Date End Date Taking? Authorizing Provider  acetaminophen (TYLENOL) 500 MG tablet Take 1 tablet (500 mg total) by mouth every 6 (six) hours as needed for mild pain or fever. 09/29/19  Yes Danford, Suann Larry, MD  albuterol (PROVENTIL HFA;VENTOLIN HFA) 108 (90 Base) MCG/ACT inhaler Inhale 2 puffs into the lungs every 6 (six) hours as needed for wheezing or shortness of breath.   Yes [provider]  amLODipine (NORVASC) 10 MG tablet Take 10 mg by mouth daily.  12/29/13  Yes [provider]  aspirin 81 MG chewable tablet Chew 1 tablet (81 mg total) by mouth daily. 09/30/19  Yes Danford, Suann Larry, MD  calcium acetate (PHOSLO) 667 MG capsule Take 1,334 mg by mouth 3 (three) times daily with meals.    Yes [provider]  carvedilol (COREG) 12.5 MG tablet  Take 12.5 mg by mouth 2 (two) times daily with a meal.  01/09/14  Yes [provider]  cetirizine (ZYRTEC) 10 MG tablet Take 10 mg by mouth daily as needed for allergies.    Yes [provider]  clopidogrel (PLAVIX) 75 MG tablet Take 1 tablet (75 mg total) by mouth daily. 01/18/18  Yes Epifanio Lesches, MD  Fluticasone-Salmeterol (ADVAIR) 250-50 MCG/DOSE AEPB Inhale 1 puff into the lungs 2 (two) times daily as needed (for shortness of breath). 09/07/19  Yes Henreitta Leber, MD  HYDROcodone-acetaminophen (NORCO) 7.5-325 MG tablet Take 1-2 tablets by mouth daily as needed (pain). 09/29/19  Yes Danford, Suann Larry, MD  lidocaine-prilocaine (EMLA) cream Apply 1 application topically as needed (prior to treatment).    Yes [provider]  losartan (COZAAR) 50 MG tablet Take 50 mg by mouth daily. 03/18/19  Yes [provider]  omeprazole (PRILOSEC) 20 MG capsule Take 1 capsule (20 mg total) by mouth 2 (two) times daily before a meal. 09/07/19  Yes Sainani, Belia Heman, MD  ondansetron (ZOFRAN) 4 MG tablet Take 4 mg by mouth every 8 (eight) hours as needed. 05/20/19  Yes [provider]  oxybutynin (DITROPAN XL) 15 MG 24 hr tablet Take 15 mg by mouth daily. 12/24/17  Yes [provider]  pravastatin (PRAVACHOL) 40 MG tablet Take 40 mg by mouth at bedtime.    Yes [provider]  scopolamine (TRANSDERM-SCOP) 1 MG/3DAYS Place 1 patch onto the skin every 3 (three) days.   Yes [provider]  topiramate (TOPAMAX) 25 MG tablet Take 1 tablet (25 mg total) by mouth as  needed (for headaches). 09/07/19  Yes Henreitta Leber, MD  torsemide (DEMADEX) 20 MG tablet Take 2 tablets (40 mg total) by mouth every Monday,Wednesday,Friday, and Sunday at 6 PM. 10/01/19  Yes Danford, Suann Larry, MD  hydrALAZINE (APRESOLINE) 50 MG tablet Take 1 tablet (50 mg total) by mouth every 8 (eight) hours. 09/29/19   Danford, Suann Larry, MD   Allergies  Allergen  Reactions  . Enalapril Maleate Other (See Comments)    Other reaction(s): Headache  . Nitrofurantoin Swelling and Rash    Other Reaction: swelling of body  . Sulfamethoxazole-Trimethoprim Swelling  . 2,4-D Dimethylamine (Amisol) Rash and Other (See Comments)    Other Reaction: h/a  . Baclofen Other (See Comments) and Nausea Only    lightheadness ,drowsiness , muscle weakness , twitching in hands   . Neosporin [Neomycin-Bacitracin Zn-Polymyx] Other (See Comments) and Rash    Other Reaction: irritation Skin irritation   . Quinine Nausea And Vomiting, Rash and Other (See Comments)    Other Reaction: Vomiting, rash, h/a, vision  . Ultram [Tramadol] Palpitations  . Zocor [Simvastatin] Other (See Comments) and Rash    Other Reaction: muscle spasms Muscle pain and spasms  . Bactrim [Sulfamethoxazole-Trimethoprim] Swelling  . Levodopa Other (See Comments)    Reaction: unknown  . Macrodantin [Nitrofurantoin Macrocrystal] Swelling  . Quinine Derivatives Other (See Comments)    Vertigo,nausea vomiting blurred vision headache ears sensitivity    Review of Systems  All other systems reviewed and are negative.   Physical Exam Pulmonary:     Effort: Pulmonary effort is normal.  Skin:    General: Skin is warm and dry.  Neurological:     Mental Status: She is alert.     Vital Signs: BP (!) 134/45 (BP Location: Right Arm)   Pulse 85   Temp 97.7 F (36.5 C) (Oral)   Resp 16   Ht 5\' 2"  (1.575 m)   Wt 62.3 kg   SpO2 100%   BMI 25.12 kg/m  Pain Scale: 0-10   Pain Score: 0-No pain   SpO2: SpO2: 100 % O2 Device:SpO2: 100 % O2 Flow Rate: .O2 Flow Rate (L/min): 2 L/min  IO: Intake/output summary:   Intake/Output Summary (Last 24 hours) at 10/02/2019 1522 Last data filed at 10/02/2019 1430 Gross per 24 hour  Intake 740 ml  Output 2371 ml  Net -1631 ml    LBM: Last BM Date: 10/08/19 Baseline Weight: Weight: 63 kg Most recent weight: Weight: 62.3 kg     Palliative  Assessment/Data:     Time In: 2:55 Time Out: 3:30 Time Total: 35 min Greater than 50%  of this time was spent counseling and coordinating care related to the above assessment and plan.  Signed by: Asencion Gowda, NP   Please contact Palliative Medicine Team phone at [phone] for questions and concerns.  For individual provider: See Shea Evans

## 2019-10-02 NOTE — Progress Notes (Signed)
Pt arrived from CCU. Alert and oriented. Pdowless, rn

## 2019-10-02 NOTE — Progress Notes (Signed)
Pre HD Assessment:    10/02/19 1002  Neurological  Level of Consciousness Alert  Orientation Level Oriented X4  Respiratory  Respiratory Pattern Regular;Unlabored  Chest Assessment Chest expansion symmetrical  Bilateral Breath Sounds Diminished  Cardiac  Pulse Regular  Heart Sounds S1, S2  Cardiac Rhythm NSR  Vascular  R Radial Pulse +2  L Radial Pulse +2  Integumentary  Integumentary (WDL) X  Skin Color Appropriate for ethnicity  Skin Integrity Ecchymosis  Ecchymosis Location Arm  Ecchymosis Location Orientation Bilateral  Musculoskeletal  Musculoskeletal (WDL) X  Generalized Weakness Yes  GU Assessment  Genitourinary (WDL) X  Genitourinary Symptoms External catheter  Urine Characteristics  Urine Color Amber  Urine Appearance Clear  Psychosocial  Psychosocial (WDL) WDL  Patient Behaviors Cooperative;Calm  Emotional support given Given to patient

## 2019-10-02 NOTE — Consult Note (Signed)
Cardiology Consultation:   Patient ID: Phyllis [name]; [id number]; November 18, 1946   Admit date: 10/01/2019 Date of Consult: 10/02/2019  Primary Care Provider: [name], MD Primary Cardiologist: Rockey Situ   Patient Profile:   Phyllis [name] is a 73 y.o. female with a hx of moderate to severe aortic valve stenosis, ESRD on HD TTS, anemia of chronic disease, aortic atherosclerosis, anxiety, DM, HTN, HLD, obesity, asthma, COPD, OSA, and physical deconditioning who is being seen today for the evaluation of dyspnea at the request of Dr. Clementeen Graham.  History of Present Illness:   [name] has no chronic chest pain felt to be secondary to costochondritis with negative stress testing in the past.  She has known aortic stenosis that has been followed with periodic echo.  Echo in 04/2019, during admission for atypical chest pain, showed normal LV systolic function, moderate aortic stenosis with a mean gradient 25 mmHg and valve area 0.98 cm.  No further ischemic cardiac work-up was recommended.  She was admitted to the hospital in 05/2019 with volume overload with volume managed by hemodialysis.  There was some concern she may have low flow low gradient severe aortic stenosis.  More recently, she was admitted to the hospital earlier this month with flash pulmonary edema requiring intubation for airway protection and underwent daily hemodialysis with subsequent improvement in volume status.  Repeat echo on 09/21/2019 showed an EF of 60 to 65%, mild LVH, normal RV systolic function and cavity size, mild to moderate left atrial dilatation, mild mitral regurgitation, moderate aortic valve stenosis by gradients and severe stenosis by valve area with a mean gradient of 20.6 mmHg and a valve area of 0.68 cm.  It was recommended her Lasix be changed to torsemide on on dialysis days for further optimization of volume management.  In the setting of her aortic valve disease her euvolemic window has  been quite narrow.  She was also treated with a 7-day course of antibiotic for possible pneumonia.  She was noted to be significantly deconditioned with recommendation to pursue aggressive work-up with physical therapy followed by outpatient evaluation by the structural heart team.  She was subsequently discharged to [facility] 2 days prior on 09/30/2019 after undergoing her regular hemodialysis.  While at [facility] she she developed sudden shortness of breath while laying in the bed.  She indicates she called for help though states no one came.  In this setting, she had to call a friend at home twice to get them to call the living facility for assistance for the patient.  In this setting, the patient became quite anxious.  Per documentation, O2 saturation was 77% on room air upon EMS arrival which subsequently improved to the low 90s on a nonrebreather.  She was subsequently placed on CPAP and has been tapered back down to supplemental oxygen via nasal cannula.  She is undergoing hemodialysis today though efforts are somewhat reduced secondary to needle placement along with ultrafiltration being limited secondary to aortic valve disease.  At time of cardiology consultation she feels her breathing is back to baseline.  She denies any lower extremity swelling, abdominal distention, chest pain, orthopnea, dizziness, presyncope, or syncope.    Past Medical History:  Diagnosis Date   (HFpEF) heart failure with preserved ejection fraction ([place])    a. TTE 01/2014: nl LV sys fxn, no valvular abnormalities; b. TTE 11/16: nl EF, mild LVH;  c. 04/2019 Echo: EF 60-65%. DD. Nl RV fxn. Mod AS. Mild-mod LAE, Sev  mitral annular Ca2+ w/o stenosis.   Allergy    Anemia of chronic disease    Anxiety    Aortic atherosclerosis (HCC)    Asthma    Chronic back pain    COPD (chronic obstructive pulmonary disease) ([place])    Diabetes mellitus with complication ([place])    ESRD on hemodialysis ([place])    a.  Tues/Sat; b. 2/2 small kidneys   Essential hypertension    Fistula    lower left arm   GERD (gastroesophageal reflux disease)    Gout    History of exercise stress test    a. 01/2014: no evidence of ischemia; b. Lexiscan 08/2015: no sig ischemia, severe GI uptake artifact, low risk; c. CPET @ [facility] 09/2016: exercised 3 min 12 sec on bike without incline, 2.28 METs, VO2 of 8.1, 48% of predicted, indicating mod to sev functional impairment, evidence of blunted HR, stroke volume, and BP augmentation as well as ventilation-perfusion mismatch with exercise   HLD (hyperlipidemia)    Moderate aortic stenosis    a. 04/2019 Echo: Mod AS.   Non-obstructive Carotid arterial disease ([place])    a. 12/2017: <50% bilat ICA dzs.   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    right chest   Sleep apnea     Past Surgical History:  Procedure Laterality Date   carpel tunnel     GALLBLADDER SURGERY     PERIPHERAL VASCULAR CATHETERIZATION N/A 04/12/2015   Procedure: A/V Shuntogram/Fistulagram;  Surgeon: Algernon Huxley, MD;  Location: [place] [facility];  Service: Cardiovascular;  Laterality: N/A;   PERIPHERAL VASCULAR CATHETERIZATION N/A 04/12/2015   Procedure: A/V Shunt Intervention;  Surgeon: Algernon Huxley, MD;  Location: [place] [facility];  Service: Cardiovascular;  Laterality: N/A;   PERIPHERAL VASCULAR CATHETERIZATION N/A 06/09/2015   Procedure: Dialysis/Perma Catheter Removal;  Surgeon: Katha Cabal, MD;  Location: [place] [facility];  Service: Cardiovascular;  Laterality: N/A;     Home Meds: Prior to Admission medications   Medication Sig Start Date End Date Taking? Authorizing Provider  acetaminophen (TYLENOL) 500 MG tablet Take 1 tablet (500 mg total) by mouth every 6 (six) hours as needed for mild pain or fever. 09/29/19  Yes Danford, Suann Larry, MD  albuterol (PROVENTIL HFA;VENTOLIN HFA) 108 (90 Base) MCG/ACT inhaler Inhale 2 puffs into the lungs every 6 (six) hours as needed  for wheezing or shortness of breath.   Yes [provider]  amLODipine (NORVASC) 10 MG tablet Take 10 mg by mouth daily.  12/29/13  Yes [provider]  aspirin 81 MG chewable tablet Chew 1 tablet (81 mg total) by mouth daily. 09/30/19  Yes Danford, Suann Larry, MD  calcium acetate (PHOSLO) 667 MG capsule Take 1,334 mg by mouth 3 (three) times daily with meals.    Yes [provider]  carvedilol (COREG) 12.5 MG tablet Take 12.5 mg by mouth 2 (two) times daily with a meal.  01/09/14  Yes [provider]  cetirizine (ZYRTEC) 10 MG tablet Take 10 mg by mouth daily as needed for allergies.    Yes [provider]  clopidogrel (PLAVIX) 75 MG tablet Take 1 tablet (75 mg total) by mouth daily. 01/18/18  Yes Epifanio Lesches, MD  Fluticasone-Salmeterol (ADVAIR) 250-50 MCG/DOSE AEPB Inhale 1 puff into the lungs 2 (two) times daily as needed (for shortness of breath). 09/07/19  Yes Henreitta Leber, MD  HYDROcodone-acetaminophen (NORCO) 7.5-325 MG tablet Take 1-2 tablets by mouth daily as needed (pain). 09/29/19  Yes Danford, Suann Larry, MD  lidocaine-prilocaine (EMLA) cream Apply 1 application topically as needed (prior to treatment).    Yes [provider]  losartan (COZAAR) 50 MG tablet Take 50 mg by mouth daily. 03/18/19  Yes [provider]  omeprazole (PRILOSEC) 20 MG capsule Take 1 capsule (20 mg total) by mouth 2 (two) times daily before a meal. 09/07/19  Yes Sainani, Belia Heman, MD  ondansetron (ZOFRAN) 4 MG tablet Take 4 mg by mouth every 8 (eight) hours as needed. 05/20/19  Yes [provider]  oxybutynin (DITROPAN XL) 15 MG 24 hr tablet Take 15 mg by mouth daily. 12/24/17  Yes [provider]  pravastatin (PRAVACHOL) 40 MG tablet Take 40 mg by mouth at bedtime.    Yes [provider]  scopolamine (TRANSDERM-SCOP) 1 MG/3DAYS Place 1 patch onto the skin every 3 (three) days.   Yes [provider]  topiramate  (TOPAMAX) 25 MG tablet Take 1 tablet (25 mg total) by mouth as needed (for headaches). 09/07/19  Yes Henreitta Leber, MD  torsemide (DEMADEX) 20 MG tablet Take 2 tablets (40 mg total) by mouth every Monday,Wednesday,Friday, and Sunday at 6 PM. 10/01/19  Yes Danford, Suann Larry, MD  hydrALAZINE (APRESOLINE) 50 MG tablet Take 1 tablet (50 mg total) by mouth every 8 (eight) hours. 09/29/19   Danford, Suann Larry, MD    Inpatient Medications: Scheduled Meds:  amLODipine  10 mg Oral Daily   aspirin  81 mg Oral Daily   carvedilol  12.5 mg Oral BID WC   chlorhexidine  15 mL Mouth Rinse BID   Chlorhexidine Gluconate Cloth  6 each Topical Daily   clopidogrel  75 mg Oral Daily   heparin  5,000 Units Subcutaneous Q8H   hydrALAZINE  50 mg Oral Q8H   insulin aspart  0-9 Units Subcutaneous Q4H   loratadine  10 mg Oral Daily   losartan  50 mg Oral Daily   mouth rinse  15 mL Mouth Rinse q12n4p   mometasone-formoterol  2 puff Inhalation BID   oxybutynin  15 mg Oral Daily   pantoprazole  40 mg Oral Daily   pravastatin  40 mg Oral QHS   scopolamine  1 patch Transdermal Q72H   sodium chloride flush  3 mL Intravenous Q12H   [START ON 10/03/2019] torsemide  40 mg Oral Q M,W,F,Su-1800   Continuous Infusions:  sodium chloride     PRN Meds: sodium chloride, acetaminophen **OR** acetaminophen, albuterol, HYDROcodone-acetaminophen, ondansetron **OR** ondansetron (ZOFRAN) IV, topiramate  Allergies:   Allergies  Allergen Reactions   Enalapril Maleate Other (See Comments)    Other reaction(s): Headache   Nitrofurantoin Swelling and Rash    Other Reaction: swelling of body   Sulfamethoxazole-Trimethoprim Swelling   2,4-D Dimethylamine (Amisol) Rash and Other (See Comments)    Other Reaction: h/a   Baclofen Other (See Comments) and Nausea Only    lightheadness ,drowsiness , muscle weakness , twitching in hands    Neosporin [Neomycin-Bacitracin Zn-Polymyx] Other (See  Comments) and Rash    Other Reaction: irritation Skin irritation    Quinine Nausea And Vomiting, Rash and Other (See Comments)    Other Reaction: Vomiting, rash, h/a, vision   Ultram [Tramadol] Palpitations   Zocor [Simvastatin] Other (See Comments) and Rash    Other Reaction: muscle spasms Muscle pain and spasms   Bactrim [Sulfamethoxazole-Trimethoprim] Swelling   Levodopa Other (See Comments)    Reaction: unknown   Macrodantin [Nitrofurantoin Macrocrystal] Swelling   Quinine Derivatives  Other (See Comments)    Vertigo,nausea vomiting blurred vision headache ears sensitivity     Social History:   Social History   Socioeconomic History   Marital status: Single    Spouse name: Not on file   Number of children: Not on file   Years of education: Not on file   Highest education level: Not on file  Occupational History   Not on file  Social Needs   Financial resource strain: Not on file   Food insecurity    Worry: Not on file    Inability: Not on file   Transportation needs    Medical: Not on file    Non-medical: Not on file  Tobacco Use   Smoking status: Never Smoker   Smokeless tobacco: Never Used  Substance and Sexual Activity   Alcohol use: No   Drug use: No   Sexual activity: Not Currently  Lifestyle   Physical activity    Days per week: Not on file    Minutes per session: Not on file   Stress: Not on file  Relationships   Social connections    Talks on phone: Not on file    Gets together: Not on file    Attends religious service: Not on file    Active member of club or organization: Not on file    Attends meetings of clubs or organizations: Not on file    Relationship status: Not on file   Intimate partner violence    Fear of current or ex partner: Not on file    Emotionally abused: Not on file    Physically abused: Not on file    Forced sexual activity: Not on file  Other Topics Concern   Not on file  Social History Narrative    Not on file     Family History:   Family History  Problem Relation Age of Onset   Hypertension Mother    Hyperlipidemia Mother    Heart disease Father    Heart attack Father 25   Hypertension Father    Hyperlipidemia Father    Heart disease Brother        CABG    Heart attack Brother    Breast cancer Neg Hx     ROS:  Review of Systems  Constitutional: Positive for malaise/fatigue. Negative for chills, diaphoresis, fever and weight loss.  HENT: Negative for congestion.   Eyes: Negative for discharge and redness.  Respiratory: Positive for shortness of breath. Negative for cough, hemoptysis, sputum production and wheezing.   Cardiovascular: Positive for orthopnea. Negative for chest pain, palpitations, claudication, leg swelling and PND.  Gastrointestinal: Negative for abdominal pain, blood in stool, heartburn, melena, nausea and vomiting.  Genitourinary: Negative for hematuria.  Musculoskeletal: Negative for falls and myalgias.  Skin: Negative for rash.  Neurological: Positive for weakness. Negative for dizziness, tingling, tremors, sensory change, speech change, focal weakness and loss of consciousness.  Endo/Heme/Allergies: Does not bruise/bleed easily.  Psychiatric/Behavioral: Negative for substance abuse. The patient is nervous/anxious.   All other systems reviewed and are negative.     Physical Exam/Data:   Vitals:   10/02/19 1400 10/02/19 1415 10/02/19 1421 10/02/19 1430  BP: (!) 103/55 (!) 115/48 (!) 135/52 (!) 134/45  Pulse: 91 83 85 85  Resp: 17 14 15 16   Temp:   97.7 F (36.5 C) 97.7 F (36.5 C)  TempSrc:   Oral Oral  SpO2:      Weight:      Height:  Intake/Output Summary (Last 24 hours) at 10/02/2019 1700 Last data filed at 10/02/2019 1430 Gross per 24 hour  Intake 740 ml  Output 2371 ml  Net -1631 ml   Filed Weights   10/01/19 1647 10/02/19 1000  Weight: 63 kg 62.3 kg   Body mass index is 25.12 kg/m.   Physical  Exam: General: Well developed, well nourished, in no acute distress. Head: Normocephalic, atraumatic, sclera non-icteric, no xanthomas, nares without discharge.  Neck: Negative for carotid bruits. JVD not elevated. Lungs: Clear bilaterally to auscultation without wheezes, rales, or rhonchi. Breathing is unlabored. Heart: RRR with S1 S2. II/VI systolic murmur RUSB, no rubs, or gallops appreciated. Abdomen: Soft, non-tender, non-distended with normoactive bowel sounds. No hepatomegaly. No rebound/guarding. No obvious abdominal masses. Msk:  Strength and tone appear normal for age. Extremities: No clubbing or cyanosis. No edema. Distal pedal pulses are 2+ and equal bilaterally. Neuro: Alert and oriented X 3. No facial asymmetry. No focal deficit. Moves all extremities spontaneously. Psych:  Responds to questions appropriately with a normal affect.   EKG:  The EKG was personally reviewed and demonstrates: NSR, 96 bpm, baseline wandering, nonspecific inferolateral ST-T changes Telemetry:  Telemetry was personally reviewed and demonstrates: SR  Weights: Filed Weights   10/01/19 1647 10/02/19 1000  Weight: 63 kg 62.3 kg    Relevant CV Studies: 2D echo 09/21/2019: 1. Left ventricular ejection fraction, by visual estimation, is 60 to 65%. The left ventricle has normal function. There is mildly increased left ventricular hypertrophy.  2. Global right ventricle has normal systolic function.The right ventricular size is normal. No increase in right ventricular wall thickness.  3. Left atrial size was mild-moderately dilated.  4. Mild mitral valve regurgitation.  5. The tricuspid valve is normal in structure. Tricuspid valve regurgitation is not demonstrated.  6. The aortic valve was not well visualized. Measurements as detailed below, Moderate aortic valve stenosis by gradients, severe stenosis by estimated AVA.  7. TR signal is inadequate for assessing pulmonary artery systolic pressure.  8.  Aortic valve mean gradient measures 20.6 mmHg.  9. Aortic valve peak gradient measures 32.0 mmHg. 10. Aortic valve area, by VTI measures 0.68 cm.  Laboratory Data:  Chemistry Recent Labs  Lab 09/30/19 0839 10/01/19 1650 10/02/19 0551  NA 134* 136 136  K 4.8 4.7 5.1  CL 95* 95* 97*  CO2 27 28 26   GLUCOSE 103* 167* 153*  BUN 38* 31* 40*  CREATININE 5.91* 4.63* 5.58*  CALCIUM 8.6* 8.8* 8.7*  GFRNONAA 7* 9* 7*  GFRAA 8* 10* 8*  ANIONGAP 12 13 13     Recent Labs  Lab 09/30/19 0839 10/01/19 1650 10/02/19 0551  PROT  --  6.4*  --   ALBUMIN 3.6 3.6 3.4*  AST  --  14*  --   ALT  --  9  --   ALKPHOS  --  59  --   BILITOT  --  0.7  --    Hematology Recent Labs  Lab 09/30/19 0839 10/01/19 1650 10/02/19 0551  WBC 4.0 9.4 6.1  RBC 2.80* 3.29* 2.69*  HGB 8.3* 9.7* 7.8*  HCT 24.9* 30.0* 24.0*  MCV 88.9 91.2 89.2  MCH 29.6 29.5 29.0  MCHC 33.3 32.3 32.5  RDW 14.7 14.7 14.6  PLT 170 300 162   Cardiac EnzymesNo results for input(s): TROPONINI in the last 168 hours. No results for input(s): TROPIPOC in the last 168 hours.  BNP Recent Labs  Lab 10/01/19 1650  BNP 1,854.0*  DDimer No results for input(s): DDIMER in the last 168 hours.  Radiology/Studies:  Dg Chest 1 View  Result Date: 10/01/2019 IMPRESSION: Multifocal interstitial/airspace opacities, possibly reflecting interstitial edema given the clinical history. No associated pleural effusions. Technically speaking, in the appropriate clinical setting, atypical/viral pneumonia (including COVID) is within the differential. Electronically Signed   By: Julian Hy M.D.   On: 10/01/2019 17:30    Assessment and Plan:   1.  Dyspnea: -Likely multifactorial including volume overload with narrow therapeutic/euvolemic window with ultrafiltration being limited by aortic stenosis, moderate to severe aortic stenosis, anemia of chronic disease, morbid obesity and significant physical deconditioning with possible  component of underlying anxiety -Symptoms appear improved during time of consultation with patient up sitting in bed comfortably  2.  Moderate to severe aortic valve stenosis: -Measurements reviewed by structural heart team and felt to not be severe at this time -Nonetheless, she does have a narrow therapeutic window given her valvular heart disease -Continue with physical therapy to work on her conditioning and follow-up with structural heart team as an outpatient for TEE, and diagnostic cath if indicated  3.  ESRD: -Volume management per nephrology  4.  Anemia of chronic disease: -Most recent hemoglobin 7.8 -EPO with hemodialysis treatment -Managed by nephrology  5.  Morbid obesity with significant physical deconditioning: -Continue to work with PT -Weight loss advised   For questions or updates, please contact [facility] Please consult [email or web address] for contact info under Cardiology/STEMI.   Signed, Christell Faith, PA-C [place] Pager: [phone] 10/02/2019, 5:00 PM

## 2019-10-02 NOTE — TOC Initial Note (Signed)
Transition of Care [facility]) - Initial/Assessment Note    Patient Details  Name: Phyllis [name] MRN: [id number] Date of Birth: January 29, 1946  Transition of Care [facility]) CM/SW Contact:    Annamaria Boots, [place] Phone Number: 10/02/2019, 4:43 PM  Clinical Narrative:  Patient readmitted from [facility] for short term rehab. Patient recently discharged to SNF for rehab and has been readmitted. CSW met with patient to discuss discharge plan. Patient reports that she would like to go to SNF at discharge but does not want to return to [facility]. Patient reports that she would prefer to go to [facility] if a bed is available. Patient was living with roommate prior to last hospitalization. Patient reports that her roommate is being followed by DSS and may be placed into a facility as well. CSW will begin bed search. CSW will continue to follow for discharge planning.                  Expected Discharge Plan: Skilled Nursing Facility Barriers to Discharge: No Barriers Identified   Patient Goals and CMS Choice   CMS Medicare.gov Compare Post Acute Care list provided to:: Patient Choice offered to / list presented to : Patient  Expected Discharge Plan and Services Expected Discharge Plan: [place] In-house Referral: Hospice / [facility] Acute Care Choice: [place] Living arrangements for the past 2 months: Single Family Home, [place]                                      Prior Living Arrangements/Services Living arrangements for the past 2 months: [place], [place] Lives with:: Roommate Patient language and need for interpreter reviewed:: Yes Do you feel safe going back to the place where you live?: Yes      Need for Family Participation in Patient Care: Yes (Comment) Care giver support system in place?: Yes (comment)   Criminal Activity/Legal Involvement Pertinent to Current  Situation/Hospitalization: No - Comment as needed  Activities of Daily Living   ADL Screening (condition at time of admission) Patient's cognitive ability adequate to safely complete daily activities?: Yes Is the patient deaf or have difficulty hearing?: No Does the patient have difficulty seeing, even when wearing glasses/contacts?: No Does the patient have difficulty concentrating, remembering, or making decisions?: No Patient able to express need for assistance with ADLs?: Yes Does the patient have difficulty dressing or bathing?: Yes Independently performs ADLs?: No Communication: Independent Dressing (OT): Needs assistance Is this a change from baseline?: Pre-admission baseline Grooming: Independent Is this a change from baseline?: Pre-admission baseline Feeding: Independent Bathing: Needs assistance Is this a change from baseline?: Pre-admission baseline Toileting: Needs assistance Is this a change from baseline?: Pre-admission baseline In/Out Bed: Needs assistance Is this a change from baseline?: Pre-admission baseline Walks in Home: Dependent Is this a change from baseline?: Pre-admission baseline Does the patient have difficulty walking or climbing stairs?: No Weakness of Legs: Both Weakness of Arms/Hands: Both  Permission Sought/Granted Permission sought to share information with : Case Manager, Customer service manager, Family Supports Permission granted to share information with : Yes, Verbal Permission Granted              Emotional Assessment Appearance:: Appears stated age Attitude/Demeanor/Rapport: Engaged Affect (typically observed): Accepting Orientation: : Oriented to Self, Oriented to Place, Oriented to  Time, Oriented to Situation  Alcohol / Substance Use: Not Applicable Psych Involvement: No (comment)  Admission diagnosis:  Respiratory distress [R06.03] ESRD (end stage renal disease) ([place]) [N18.6] Healthcare-associated pneumonia  [J18.9] Other hypervolemia [E87.79] Sepsis, due to unspecified organism, unspecified whether acute organ dysfunction present [facility]) [A41.9] Patient Active Problem List   Diagnosis Date Noted  . Acute on chronic respiratory failure with hypoxia ([place]) 10/01/2019  . Aortic valve stenosis   . Acute respiratory failure with hypoxemia ([place]) 09/20/2019  . COPD with acute bronchitis ([place]) 09/05/2019  . Congestive heart failure ([place]) 05/24/2019  . Hyperkalemia 11/29/2018  . Chronic diastolic heart failure ([place]) 11/02/2018  . HTN (hypertension) 11/02/2018  . Uncontrolled hypertension 10/21/2018  . Pressure injury of skin 03/16/2018  . CVA (cerebral vascular accident) ([place]) 01/16/2018  . Aortic atherosclerosis ([place]) 12/11/2017  . Chest pain 09/18/2017  . Hyperlipidemia 08/06/2015  . Complication from renal dialysis device 04/12/2015  . SOB (shortness of breath) 02/01/2015  . End stage renal disease on dialysis ([place]) 02/01/2015  . Type 2 diabetes mellitus with other specified complication ([place]) 90/68/9340  . Asthma 02/01/2015  . Acute on chronic diastolic CHF (congestive heart failure) ([place]) 02/01/2015   PCP:  [name], MD Pharmacy:   [facility] [place] [place] [address] Phone: [phone] Fax: [place], [place] [address] [place] [address] Phone: [phone] Fax: [phone]  [id number] [place] [id number]: [phone]: [phone]    Social Determinants of Health (SDOH) Interventions    Readmission Risk Interventions Readmission Risk Prevention Plan 09/26/2019 05/07/2019  Transportation Screening Complete Complete  Medication Review (Press photographer Complete Complete  PCP or Specialist appointment within 3-5 days of discharge Complete Complete  HRI  or Home Care Consult Complete Complete  SW Recovery Care/Counseling Consult Complete -  Palliative Care Screening Not Applicable Not A[place]Not Applicable Not Applicable  Some recent data might be hidden

## 2019-10-02 NOTE — Progress Notes (Signed)
Pre HD     10/02/19 1000  Vital Signs  Temp 98.4 F (36.9 C)  Temp Source Oral  Pulse Rate 85  Pulse Rate Source Monitor  Resp 20  BP (!) 138/56  BP Location Right Arm  BP Method Automatic  Patient Position (if appropriate) Lying  Oxygen Therapy  SpO2 99 %  O2 Device Nasal Cannula  Heater temperature 36.5 F (2.5 C)  Pain Assessment  Pain Scale 0-10  Pain Score 0  Dialysis Weight  Weight 62.3 kg  Type of Weight Pre-Dialysis  Time-Out for Hemodialysis  What Procedure? HD  Pt Identifiers(min of two) First/Last Name;MRN/Account#;Pt's DOB(use if MRN/Acct# not available  Correct Site? Yes  Correct Side? Yes  Correct Procedure? Yes  Consents Verified? Yes  Rad Studies Available? N/A  Safety Precautions Reviewed? Yes  Engineer, civil (consulting) Number [place] Number  (Bedside)  UF/Alarm Test Passed  Conductivity: Meter 13.9  Conductivity: Machine  14  pH 7.2  Reverse Osmosis WRO#4  Normal Saline Lot Number [id number]  Dialyzer Lot Number [id number]  Disposable Set Lot Number [id number]  Machine Temperature 98.6 F (37 C)  Musician and Audible Yes  Blood Lines Intact and Secured Yes  Pre Treatment Patient Checks  Vascular access used during treatment Fistula  HD catheter dressing before treatment  (n/a)  Patient is receiving dialysis in a chair  (no)  Hepatitis B Surface Antigen Results Negative  Date Hepatitis B Surface Antigen Drawn 09/06/19  Isolation Initiated  (no)  Hepatitis B Surface Antibody  (>10)  Date Hepatitis B Surface Antibody Drawn 11/29/18  Hemodialysis Consent Verified Yes  Hemodialysis Standing Orders Initiated Yes  ECG (Telemetry) Monitor On Yes  Prime Ordered Normal Saline  Length of  DialysisTreatment -hour(s) 3 Hour(s)  Dialysis Treatment Comments  (Na 140)  Dialyzer Elisio 17H NR  Dialysate 3K;2.5 Ca  Dialysate Flow Ordered 600  Blood Flow Rate Ordered 400 mL/min  Ultrafiltration Goal 2.5 Liters  Dialysis Blood Pressure Support  Ordered Normal Saline  Education / Care Plan  Dialysis Education Provided Yes  Documented Education in Care Plan Yes  Outpatient Plan of Care Reviewed and on Chart Yes  Fistula / Graft Left Forearm Arteriovenous fistula  No Placement Date or Time found.   Placed prior to admission: Yes  Orientation: Left  Access Location: Forearm  Access Type: Arteriovenous fistula  Site Condition No complications  Fistula / Graft Assessment Bruit;Thrill;Present  Status Accessed  Needle Size 15  Drainage Description None

## 2019-10-02 NOTE — NC FL2 (Signed)
[place] LEVEL OF CARE SCREENING TOOL     IDENTIFICATION  [name] Birthdate: [birth year] Sex: female Admission Date (Current Location): 10/01/2019  [place] and [place] Number:  Engineering geologist and Address:  [facility], [address] [place], [place] [address]      Provider Number: [id number]  Attending Physician Name and Address:  Louellen Molder, MD  Relative Name and Phone Number:       Current Level of Care: Hospital Recommended Level of Care: [place] Prior Approval Number:    Date Approved/Denied:   PASRR Number: [id number]  Discharge Plan: SNF    Current Diagnoses: Patient Active Problem List   Diagnosis Date Noted  . Acute on chronic respiratory failure with hypoxia ([place]) 10/01/2019  . Aortic valve stenosis   . Acute respiratory failure with hypoxemia ([place]) 09/20/2019  . COPD with acute bronchitis ([place]) 09/05/2019  . Congestive heart failure ([place]) 05/24/2019  . Hyperkalemia 11/29/2018  . Chronic diastolic heart failure ([place]) 11/02/2018  . HTN (hypertension) 11/02/2018  . Uncontrolled hypertension 10/21/2018  . Pressure injury of skin 03/16/2018  . CVA (cerebral vascular accident) ([place]) 01/16/2018  . Aortic atherosclerosis ([place]) 12/11/2017  . Chest pain 09/18/2017  . Hyperlipidemia 08/06/2015  . Complication from renal dialysis device 04/12/2015  . SOB (shortness of breath) 02/01/2015  . End stage renal disease on dialysis ([place]) 02/01/2015  . Type 2 diabetes mellitus with other specified complication ([place]) [id number]  . Asthma 02/01/2015  . Acute on chronic diastolic CHF (congestive heart failure) ([place]) 02/01/2015    Orientation RESPIRATION BLADDER Height & Weight     Self, Time, Situation, Place  O2(2 liters) Continent Weight: 137 lb 5.6 oz (62.3 kg) Height:  5\' 2"  (157.5 cm)  BEHAVIORAL SYMPTOMS/MOOD NEUROLOGICAL BOWEL NUTRITION STATUS  Other (Comment)(none)    Continent Diet(Renal diet with fluid restriction 1250ml)  AMBULATORY STATUS COMMUNICATION OF NEEDS Skin   Extensive Assist Verbally Normal                       Personal Care Assistance Level of Assistance  Bathing, Feeding, Dressing Bathing Assistance: Limited assistance Feeding assistance: Independent Dressing Assistance: Limited assistance     Functional Limitations Info  Sight, Speech, Hearing Sight Info: Adequate Hearing Info: Adequate Speech Info: Adequate    SPECIAL CARE FACTORS FREQUENCY  PT (By licensed PT), OT (By licensed OT)     PT Frequency: 5 OT Frequency: 5            Contractures Contractures Info: Not present    Additional Factors Info  Code Status, Allergies Code Status Info: Full Code Allergies Info: : Enalapril Maleate, Nitrofurantoin, Sulfamethoxazole-trimethoprim, 2,4-d Dimethylamine, Baclofen, Neosporin, Quinine, Ultram, Zocor, Bactrim, Levodopa, Macrodantin, Quinine Derivatives           Current Medications (10/02/2019):  This is the current hospital active medication list Current Facility-Administered Medications  Medication Dose Route Frequency Provider Last Rate Last Dose  . 0.9 %  sodium chloride infusion   Intravenous PRN Zada Finders R, MD      . acetaminophen (TYLENOL) tablet 650 mg  650 mg Oral Q6H PRN Lenore Cordia, MD   650 mg at 10/02/19 0109   Or  . acetaminophen (TYLENOL) suppository 650 mg  650 mg Rectal Q6H PRN Zada Finders R, MD      . albuterol (PROVENTIL) (2.5 MG/3ML) 0.083% nebulizer solution 2.5 mg  2.5 mg Inhalation Q6H PRN Zada Finders  R, MD      . amLODipine (NORVASC) tablet 10 mg  10 mg Oral Daily Lenore Cordia, MD   10 mg at 10/02/19 1543  . aspirin chewable tablet 81 mg  81 mg Oral Daily Lenore Cordia, MD   81 mg at 10/02/19 1543  . carvedilol (COREG) tablet 12.5 mg  12.5 mg Oral BID WC Patel, Vishal R, MD      . chlorhexidine (PERIDEX) 0.12 % solution 15 mL  15 mL Mouth Rinse BID Zada Finders R, MD    15 mL at 10/02/19 1545  . Chlorhexidine Gluconate Cloth 2 % PADS 6 each  6 each Topical Daily Lenore Cordia, MD   6 each at 10/02/19 1543  . clopidogrel (PLAVIX) tablet 75 mg  75 mg Oral Daily Zada Finders R, MD   75 mg at 10/02/19 1531  . heparin injection 5,000 Units  5,000 Units Subcutaneous Q8H Zada Finders R, MD   5,000 Units at 10/02/19 1544  . hydrALAZINE (APRESOLINE) tablet 50 mg  50 mg Oral Q8H Zada Finders R, MD   50 mg at 10/02/19 1544  . HYDROcodone-acetaminophen (NORCO) 7.5-325 MG per tablet 1-2 tablet  1-2 tablet Oral Daily PRN Lang Snow, NP   1 tablet at 10/02/19 1542  . insulin aspart (novoLOG) injection 0-9 Units  0-9 Units Subcutaneous Q4H Lenore Cordia, MD   2 Units at 10/02/19 1315  . loratadine (CLARITIN) tablet 10 mg  10 mg Oral Daily Lang Snow, NP   10 mg at 10/02/19 1542  . losartan (COZAAR) tablet 50 mg  50 mg Oral Daily Zada Finders R, MD      . MEDLINE mouth rinse  15 mL Mouth Rinse q12n4p Zada Finders R, MD   15 mL at 10/02/19 1546  . mometasone-formoterol (DULERA) 200-5 MCG/ACT inhaler 2 puff  2 puff Inhalation BID Zada Finders R, MD      . ondansetron (ZOFRAN) tablet 4 mg  4 mg Oral Q6H PRN Lenore Cordia, MD       Or  . ondansetron (ZOFRAN) injection 4 mg  4 mg Intravenous Q6H PRN Zada Finders R, MD      . oxybutynin (DITROPAN-XL) 24 hr tablet 15 mg  15 mg Oral Daily Lang Snow, NP   15 mg at 10/02/19 1531  . pantoprazole (PROTONIX) EC tablet 40 mg  40 mg Oral Daily Lang Snow, NP   40 mg at 10/02/19 1543  . pravastatin (PRAVACHOL) tablet 40 mg  40 mg Oral QHS Patel, Vishal R, MD      . scopolamine (TRANSDERM-SCOP) 1 MG/3DAYS 1.5 mg  1 patch Transdermal Q72H Lang Snow, NP   1.5 mg at 10/02/19 0605  . sodium chloride flush (NS) 0.9 % injection 3 mL  3 mL Intravenous Q12H Zada Finders R, MD   3 mL at 10/02/19 1545  . topiramate (TOPAMAX) tablet 25 mg  25 mg Oral PRN Lang Snow, NP      . Derrill Memo ON 10/03/2019] torsemide (DEMADEX) tablet 40 mg  40 mg Oral Q M,W,F,Su-1800 Lenore Cordia, MD         Discharge Medications: Please see discharge summary for a list of discharge medications.  Relevant Imaging Results:  Relevant Lab Results:   Additional Information SSN: [id number]  Annamaria Boots, [place]

## 2019-10-02 NOTE — Progress Notes (Signed)
PROGRESS NOTE                                                                                                                                                                                                             Patient Demographics:    Phyllis [name], is a 73 y.o. female, DOB - Aug 11, 1946, [id number]  Admit date - 10/01/2019   Admitting Physician Lenore Cordia, MD  Outpatient Primary MD for the patient is [name], MD  LOS - 1  Outpatient Specialists: Renal  Chief Complaint  Patient presents with   Respiratory Distress       Brief Narrative   73 year old female with history of ESRD on hemodialysis (TTS), diastolic CHF, severe aortic stenosis, COPD, type 2 diabetes mellitus, hypertension and hyperlipidemia who was recently hospitalized from 10/31-11/10 for acute hypoxic respiratory failure secondary to CHF exacerbation with pulmonary edema and required mechanical ventilation with difficulty managing her volume due to her severe aortic stenosis and dialysis dependence.  She was also treated with 7-day course of antibiotic for possible pneumonia. She was discharged to SNF but on the next day started feeling short of breath with minimal exertion.  EMS was called and found to have O2 sat of 77% on room air and improved to low 90s on nonrebreather.  She was placed on CPAP and brought to the ED. In the ED she had low-grade fever and sats maintained on CPAP.  Chest x-ray showed new multifocal interstitial opacity.  Given empiric antibiotic, IV Solu-Medrol, DuoNeb.  Nephrology consulted and patient admitted to stepdown unit.     Subjective:   Reports breathing slightly better, no cough, CP or fever   Assessment  & Plan :    Principal Problem:   Acute on chronic respiratory failure with hypoxia (HCC) Secondary to acute pulmonary edema with end-stage renal disease.  Symptoms contributed by her severe  aortic stenosis. Cardiology consulted who will arrange outpatient appointment for TAVR. Makes minimal urine.  Volume management with dialysis.  Patient did receive her scheduled dialysis 2 days back before being discharged.  Nephrology on board and will be receiving dialysis today.  Patient was discharged on torsemide to be received on nondialysis days. No clinical signs of infection.  Will discontinue antibiotics  Active Problems:   End stage renal disease on dialysis (HCC) Ultrafiltration limited due to  severe aortic stenosis.  Scheduled hemodialysis today.  (TTS)    Type 2 diabetes mellitus with other specified complication (HCC) Monitor on sliding scale coverage.    Acute on chronic diastolic CHF (congestive heart failure) (HCC) Volume management with dialysis, limited with severe AS   Essential hypertension Resume home meds (Coreg, hydralazine, torsemide and losartan).  Anemia of chronic disease Hemoglobin of 7.8.  Receiving EPO with hemodialysis       Code Status : Full code.  Patient was made DNR during last hospitalization but then switched to being full code.  Will obtain palliative care consult for goals of care discussion.  Family Communication  : None  Disposition Plan  : Home pending clinical improvement  Barriers For Discharge : Active symptoms  Consults  : Cardiology, renal  Procedures  : None  DVT Prophylaxis  : Subcu heparin  Lab Results  Component Value Date   PLT 162 10/02/2019    Antibiotics  :   Anti-infectives (From admission, onward)   Start     Dose/Rate Route Frequency Ordered Stop   10/01/19 1800  vancomycin (VANCOCIN) IVPB 1000 mg/200 mL premix     1,000 mg 200 mL/hr over 60 Minutes Intravenous  Once 10/01/19 1748 10/01/19 1922   10/01/19 1800  ceFEPIme (MAXIPIME) 2 g in sodium chloride 0.9 % 100 mL IVPB     2 g 200 mL/hr over 30 Minutes Intravenous  Once 10/01/19 1748 10/01/19 1857        Objective:   Vitals:   10/02/19 0300  10/02/19 0400 10/02/19 0500 10/02/19 0600  BP: (!) 127/48 (!) 127/50 (!) 154/53 (!) 126/49  Pulse: 80 81 87 85  Resp: 19 20 (!) 23 20  Temp:      TempSrc:      SpO2: 99% 100% 99% 99%  Weight:      Height:        Wt Readings from Last 3 Encounters:  10/01/19 63 kg  09/30/19 63 kg  09/07/19 62.4 kg     Intake/Output Summary (Last 24 hours) at 10/02/2019 1018 Last data filed at 10/02/2019 0600 Gross per 24 hour  Intake 500 ml  Output 100 ml  Net 400 ml     Physical Exam  Gen: not in distress HEENT:  moist mucosa, supple neck Chest: fine bibasilar crackles CVS: N [id number], 3/6 systolic murmur GI: soft, NT, ND,  Musculoskeletal: warm, no edema, left AV fistula, trace edema b/l     Data Review:    CBC Recent Labs  Lab 09/26/19 0335 09/30/19 0839 10/01/19 1650 10/02/19 0551  WBC 4.4 4.0 9.4 6.1  HGB 8.2* 8.3* 9.7* 7.8*  HCT 25.7* 24.9* 30.0* 24.0*  PLT 119* 170 300 162  MCV 90.2 88.9 91.2 89.2  MCH 28.8 29.6 29.5 29.0  MCHC 31.9 33.3 32.3 32.5  RDW 13.5 14.7 14.7 14.6  LYMPHSABS 0.8  --  1.3  --   MONOABS 0.3  --  0.7  --   EOSABS 0.2  --  0.5  --   BASOSABS 0.0  --  0.1  --     Chemistries  Recent Labs  Lab 09/26/19 0335  09/28/19 0504 09/29/19 0830 09/30/19 0839 10/01/19 1650 10/02/19 0551  NA 138  --  139 136 134* 136 136  K 3.0*  --  4.1 4.3 4.8 4.7 5.1  CL 99  --  99 97* 95* 95* 97*  CO2 29  --  31 27 27 28 26   GLUCOSE 97  --  99 110* 103* 167* 153*  BUN 20  --  16 26* 38* 31* 40*  CREATININE 2.75*   < > 2.62* 4.49* 5.91* 4.63* 5.58*  CALCIUM 7.6*  --  8.2* 8.6* 8.6* 8.8* 8.7*  MG 1.6*  --  2.1  --   --   --  2.3  AST  --   --   --   --   --  14*  --   ALT  --   --   --   --   --  9  --   ALKPHOS  --   --   --   --   --  59  --   BILITOT  --   --   --   --   --  0.7  --    < > = values in this interval not displayed.   ------------------------------------------------------------------------------------------------------------------ No  results for input(s): CHOL, HDL, LDLCALC, TRIG, CHOLHDL, LDLDIRECT in the last 72 hours.  Lab Results  Component Value Date   HGBA1C 4.5 (L) 05/06/2019   ------------------------------------------------------------------------------------------------------------------ No results for input(s): TSH, T4TOTAL, T3FREE, THYROIDAB in the last 72 hours.  Invalid input(s): FREET3 ------------------------------------------------------------------------------------------------------------------ No results for input(s): VITAMINB12, FOLATE, FERRITIN, TIBC, IRON, RETICCTPCT in the last 72 hours.  Coagulation profile No results for input(s): INR, PROTIME in the last 168 hours.  No results for input(s): DDIMER in the last 72 hours.  Cardiac Enzymes No results for input(s): CKMB, TROPONINI, MYOGLOBIN in the last 168 hours.  Invalid input(s): CK ------------------------------------------------------------------------------------------------------------------    Component Value Date/Time   BNP 1,854.0 (H) 10/01/2019 1650    Inpatient Medications  Scheduled Meds:  amLODipine  10 mg Oral Daily   aspirin  81 mg Oral Daily   carvedilol  12.5 mg Oral BID WC   chlorhexidine  15 mL Mouth Rinse BID   Chlorhexidine Gluconate Cloth  6 each Topical Daily   clopidogrel  75 mg Oral Daily   heparin  5,000 Units Subcutaneous Q8H   hydrALAZINE  50 mg Oral Q8H   insulin aspart  0-9 Units Subcutaneous Q4H   loratadine  10 mg Oral Daily   losartan  50 mg Oral Daily   mouth rinse  15 mL Mouth Rinse q12n4p   mometasone-formoterol  2 puff Inhalation BID   oxybutynin  15 mg Oral Daily   pantoprazole  40 mg Oral Daily   pravastatin  40 mg Oral QHS   scopolamine  1 patch Transdermal Q72H   sodium chloride flush  3 mL Intravenous Q12H   [START ON 10/03/2019] torsemide  40 mg Oral Q M,W,F,Su-1800   Continuous Infusions:  sodium chloride     PRN Meds:.sodium chloride, acetaminophen **OR**  acetaminophen, albuterol, HYDROcodone-acetaminophen, ondansetron **OR** ondansetron (ZOFRAN) IV, topiramate  Micro Results Recent Results (from the past 240 hour(s))  CULTURE, BLOOD (ROUTINE X 2) w Reflex to ID Panel     Status: None   Collection Time: 09/22/19  6:30 PM   Specimen: BLOOD  Result Value Ref Range Status   Specimen Description BLOOD BLOOD LEFT HAND  Final   Special Requests   Final    BOTTLES DRAWN AEROBIC ONLY Blood Culture results may not be optimal due to an inadequate volume of blood received in culture bottles   Culture   Final    NO GROWTH 8 DAYS Performed at [facility], [address] [place], [place] [address]    Report Status 09/30/2019 FINAL  Final  CULTURE, BLOOD (ROUTINE  X 2) w Reflex to ID Panel     Status: None   Collection Time: 09/22/19  6:53 PM   Specimen: BLOOD  Result Value Ref Range Status   Specimen Description BLOOD BLOOD RIGHT HAND  Final   Special Requests   Final    BOTTLES DRAWN AEROBIC AND ANAEROBIC Blood Culture adequate volume   Culture   Final    NO GROWTH 8 DAYS Performed at [facility], [address], [place], [place] [address]    Report Status 09/30/2019 FINAL  Final  SARS CORONAVIRUS 2 (TAT 6-24 HRS) Nasopharyngeal Nasopharyngeal Swab     Status: None   Collection Time: 10/01/19  4:50 PM   Specimen: Nasopharyngeal Swab  Result Value Ref Range Status   SARS Coronavirus 2 NEGATIVE NEGATIVE Final    Comment: (NOTE) SARS-CoV-2 target nucleic acids are NOT DETECTED. The SARS-CoV-2 RNA is generally detectable in upper and lower respiratory specimens during the acute phase of infection. Negative results do not preclude SARS-CoV-2 infection, do not rule out co-infections with other pathogens, and should not be used as the sole basis for treatment or other patient management decisions. Negative results must be combined with clinical observations, patient history, and epidemiological information. The  expected result is Negative. Fact Sheet for Patients: SugarRoll.be Fact Sheet for Healthcare Providers: https://www.woods-mathews.com/ This test is not yet approved or cleared by the [place] FDA and  has been authorized for detection and/or diagnosis of SARS-CoV-2 by FDA under an Emergency Use Authorization (EUA). This EUA will remain  in effect (meaning this test can be used) for the duration of the COVID-19 declaration under Section 56 4(b)(1) of the Act, 21 U.S.C. section 360bbb-3(b)(1), unless the authorization is terminated or revoked sooner. Performed at [facility], [place] [address], [place], [place] [address]   SARS Coronavirus 2 by RT PCR (hospital order, performed in [facility] hospital lab) Nasopharyngeal Nasopharyngeal Swab     Status: None   Collection Time: 10/01/19 11:04 PM   Specimen: Nasopharyngeal Swab  Result Value Ref Range Status   SARS Coronavirus 2 NEGATIVE NEGATIVE Final    Comment: (NOTE) If result is NEGATIVE SARS-CoV-2 target nucleic acids are NOT DETECTED. The SARS-CoV-2 RNA is generally detectable in upper and lower  respiratory specimens during the acute phase of infection. The lowest  concentration of SARS-CoV-2 viral copies this assay can detect is 250  copies / mL. A negative result does not preclude SARS-CoV-2 infection  and should not be used as the sole basis for treatment or other  patient management decisions.  A negative result may occur with  improper specimen collection / handling, submission of specimen other  than nasopharyngeal swab, presence of viral mutation(s) within the  areas targeted by this assay, and inadequate number of viral copies  (<250 copies / mL). A negative result must be combined with clinical  observations, patient history, and epidemiological information. If result is POSITIVE SARS-CoV-2 target nucleic acids are DETECTED. The SARS-CoV-2 RNA is generally detectable  in upper and lower  respiratory specimens dur ing the acute phase of infection.  Positive  results are indicative of active infection with SARS-CoV-2.  Clinical  correlation with patient history and other diagnostic information is  necessary to determine patient infection status.  Positive results do  not rule out bacterial infection or co-infection with other viruses. If result is PRESUMPTIVE POSTIVE SARS-CoV-2 nucleic acids MAY BE PRESENT.   A presumptive positive result was obtained on the submitted specimen  and confirmed on repeat testing.  While 2019 novel coronavirus  (SARS-CoV-2) nucleic acids may be present in the submitted sample  additional confirmatory testing may be necessary for epidemiological  and / or clinical management purposes  to differentiate between  SARS-CoV-2 and other Sarbecovirus currently known to infect humans.  If clinically indicated additional testing with an alternate test  methodology [phone]) is advised. The SARS-CoV-2 RNA is generally  detectable in upper and lower respiratory sp ecimens during the acute  phase of infection. The expected result is Negative. Fact Sheet for Patients:  StrictlyIdeas.no Fact Sheet for Healthcare Providers: BankingDealers.co.za This test is not yet approved or cleared by the [place] FDA and has been authorized for detection and/or diagnosis of SARS-CoV-2 by FDA under an Emergency Use Authorization (EUA).  This EUA will remain in effect (meaning this test can be used) for the duration of the COVID-19 declaration under Section 564(b)(1) of the Act, 21 U.S.C. section 360bbb-3(b)(1), unless the authorization is terminated or revoked sooner. Performed at [facility], [address], [place], [place] [address]     Radiology Reports Dg Chest 1 View  Result Date: 10/01/2019 CLINICAL DATA:  Shortness of breath, respiratory distress EXAM: CHEST  1 VIEW  COMPARISON:  09/24/2019 FINDINGS: Multifocal interstitial/airspace opacities, lower lobe predominant. This appearance is new/progressive from the prior. No pleural effusion or pneumothorax. The heart is normal in size.  Thoracic aortic atherosclerosis. Degenerative changes of the visualized thoracolumbar spine. IMPRESSION: Multifocal interstitial/airspace opacities, possibly reflecting interstitial edema given the clinical history. No associated pleural effusions. Technically speaking, in the appropriate clinical setting, atypical/viral pneumonia (including COVID) is within the differential. Electronically Signed   By: Julian Hy M.D.   On: 10/01/2019 17:30   Dg Abd 1 View  Result Date: 09/20/2019 CLINICAL DATA:  OG tube placement EXAM: ABDOMEN - 1 VIEW COMPARISON:  None. FINDINGS: OG tube appears adequately positioned in the stomach with proximal sideholes just below the level of the gastroesophageal junction. Visualized bowel gas pattern is nonobstructive. No evidence of free intraperitoneal air or abnormal fluid collection. Aortic atherosclerosis. IMPRESSION: 1. OG tube appears adequately positioned in the stomach with proximal sideholes just below the level of the gastroesophageal junction. 2. Nonobstructive bowel gas pattern. 3. Aortic atherosclerosis. Electronically Signed   By: Franki Cabot M.D.   On: 09/20/2019 11:52   Ct Chest Wo Contrast  Result Date: 09/19/2019 CLINICAL DATA:  Midsternal chest pain for 2 weeks with shortness of breath. Nonsmoker without history of cancer. EXAM: CT CHEST WITHOUT CONTRAST TECHNIQUE: Multidetector CT imaging of the chest was performed following the standard protocol without IV contrast. COMPARISON:  09/05/2019 chest radiograph. Most recent CT of 09/02/2008 FINDINGS: Cardiovascular: Advanced aortic and branch vessel atherosclerosis. Tortuous thoracic aorta. Mild cardiomegaly, without pericardial effusion. Multivessel coronary artery atherosclerosis. Aortic  valve calcification. Mediastinum/Nodes: Hypoattenuating left thyroid nodule of 1.2 cm, new but nonspecific. No mediastinal or definite hilar adenopathy, given limitations of unenhanced CT. Lungs/Pleura: No pleural fluid.  Clear lungs. Upper Abdomen: Nonspecific caudate lobe enlargement. Similar. Normal imaged portions of the spleen, adrenal glands. Renal atrophy bilaterally. An upper pole left renal exophytic 1.8 cm cyst. Pancreatic tail 1.8 cm lesion on 114/2 is minimally enlarged compared to 2009 where it measured 1.5 cm. Calcification in its inferior portion. There is also a incompletely imaged pancreatic body lesion on 125/2, on the order of 1.8 cm-similar. Suggestion of peripancreatic inflammation, especially about the pancreatic tail lesion on 113/2. Cholecystectomy. Musculoskeletal: Lower thoracic spondylosis. No sternal  fracture or retrosternal fluid. IMPRESSION: 1.  No acute process in the chest. 2. Coronary artery atherosclerosis. Aortic Atherosclerosis (ICD10-I70.0). 3. Pancreatic cystic lesions, suboptimally and incompletely evaluated. Suspicion of peripancreatic edema. Correlate with symptoms to suggest acute on chronic pancreatitis. Consider follow-up with dedicated MRI or CT. 4. Chronic caudate lobe prominence, nonspecific. Correlate with any risk factors for cirrhosis. Electronically Signed   By: Abigail Miyamoto M.D.   On: 09/19/2019 12:28   Dg Chest Port 1 View  Result Date: 09/24/2019 CLINICAL DATA:  Pulmonary infiltrates. EXAM: PORTABLE CHEST 1 VIEW COMPARISON:  09/22/2019 FINDINGS: Improved appearance of interstitial and heterogeneous opacity seen on prior exams. Cardiomediastinal contours are stable with cardiac enlargement and dense aortic calcification and tortuosity. No signs of effusion or dense consolidation. No acute bone finding. IMPRESSION: 1. Improved appearance of presumed edema, potentially superimposed on chronic lung disease. 2. Stable dense aortic calcification and tortuosity.  Electronically Signed   By: Zetta Bills M.D.   On: 09/24/2019 10:41   Dg Chest Port 1 View  Result Date: 09/22/2019 CLINICAL DATA:  Pulmonary disease EXAM: PORTABLE CHEST 1 VIEW COMPARISON:  09/20/2019 FINDINGS: Bilateral diffuse interstitial thickening. No pleural effusion or pneumothorax. No focal consolidation. Stable cardiomediastinal silhouette. Thoracic aortic atherosclerosis. No acute osseous abnormality. IMPRESSION: Diffuse bilateral interstitial thickening likely reflecting chronic interstitial disease with possible superimposed interstitial edema or infection. Electronically Signed   By: Kathreen Devoid   On: 09/22/2019 18:04   Dg Chest Port 1 View  Result Date: 09/20/2019 CLINICAL DATA:  Intubation EXAM: PORTABLE CHEST 1 VIEW COMPARISON:  09/20/2019, 5:50 a.m. FINDINGS: Interval placement of endotracheal tube, tip positioned over the lower trachea, approximately 2 cm above the carina. Esophagogastric tube is position with tip and side port below the diaphragm. Mild cardiomegaly. Interval improvement in bilateral heterogeneous and interstitial opacity, most conspicuous in the right midlung. IMPRESSION: 1. Interval placement of endotracheal tube, tip positioned over the lower trachea, approximately 2 cm above the carina. Esophagogastric tube is position with tip and side port below the diaphragm. 2. Interval improvement in bilateral heterogeneous and interstitial opacity, most conspicuous in the right midlung, likely improved edema. Electronically Signed   By: Eddie Candle M.D.   On: 09/20/2019 11:20   Dg Chest Port 1 View  Result Date: 09/20/2019 CLINICAL DATA:  Intubated. Respiratory distress. EXAM: PORTABLE CHEST 1 VIEW COMPARISON:  09/05/2019 FINDINGS: The endotracheal tube tip is at the thoracic inlet. Borderline enlarged cardiac silhouette. Tortuous and calcified thoracic aorta. Extensive patchy bilateral airspace opacity, greater on the right. No pleural fluid. Thoracic spine  degenerative changes. IMPRESSION: 1. Endotracheal tube tip at the thoracic inlet. It is recommended that this be advanced 4.3 cm. 2. Extensive patchy bilateral probable pneumonia. 3. Aortic atherosclerosis. Electronically Signed   By: Claudie Revering M.D.   On: 09/20/2019 06:41   Dg Chest Port 1 View  Result Date: 09/05/2019 CLINICAL DATA:  Shortness of breath EXAM: PORTABLE CHEST 1 VIEW COMPARISON:  05/23/2019 FINDINGS: Tortuosity of the thoracic aorta with diffuse calcifications. Aorta is normal size. Low lung volumes with mild peribronchial thickening and interstitial prominence. No effusions. No acute bony abnormality. IMPRESSION: Mild peribronchial thickening and interstitial prominence may reflect bronchitic changes. Electronically Signed   By: Rolm Baptise M.D.   On: 09/05/2019 17:12    Time Spent in minutes  35   Nasean Zapf M.D on 10/02/2019 at 10:18 AM  Between 7am to 7pm - Pager - [phone]  After 7pm go to [email or web address] - password [facility]  [facility] -  Office  [phone]

## 2019-10-02 NOTE — Progress Notes (Signed)
[facility]  ROUNDING NOTE   Subjective:   Seen and examined on hemodialysis treatment. BFR reduced due to needle placement.  UF goal of 2.5 liters  Breathing nonlabored.  Sitting up in bed, eating.   Objective:  Vital signs in last 24 hours:  Temp:  [98.2 F (36.8 C)-99.6 F (37.6 C)] (P) 98.4 F (36.9 C) (11/12 1030) Pulse Rate:  [66-96] 88 (11/12 1100) Resp:  [0-39] 19 (11/12 1100) BP: (120-158)/(47-60) 145/60 (11/12 1100) SpO2:  [85 %-100 %] 85 % (11/12 1100) FiO2 (%):  [50 %] 50 % (11/11 1649) Weight:  [63 kg] 63 kg (11/11 1647)  Weight change:  Filed Weights   10/01/19 1647  Weight: 63 kg    Intake/Output: I/O last 3 completed shifts: In: 500 [IV Piggyback:500] Out: 100 [Urine:100]   Intake/Output this shift:  No intake/output data recorded.  Physical Exam: General: Ill, sitting in bed  Head: [place]/AT  Eyes: PERRL  Neck: Supple, trachea midline  Lungs:   Coarse breath sounds bilaterally  Heart: Regular rate and rhythm  Abdomen:  Soft, nontender, obese  Extremities: + peripheral edema.  Neurologic: Nonfocal, moving all four extremities  Skin: No lesions  Access: Left AVF    Basic Metabolic Panel: Recent Labs  Lab 09/26/19 0335  09/27/19 1919 09/28/19 0504 09/28/19 1807 09/29/19 0830 09/30/19 0839 10/01/19 1650 10/02/19 0551  NA 138  --   --  139  --  136 134* 136 136  K 3.0*  --   --  4.1  --  4.3 4.8 4.7 5.1  CL 99  --   --  99  --  97* 95* 95* 97*  CO2 29  --   --  31  --  27 27 28 26   GLUCOSE 97  --   --  99  --  110* 103* 167* 153*  BUN 20  --   --  16  --  26* 38* 31* 40*  CREATININE 2.75*   < >  --  2.62*  --  4.49* 5.91* 4.63* 5.58*  CALCIUM 7.6*  --   --  8.2*  --  8.6* 8.6* 8.8* 8.7*  MG 1.6*  --   --  2.1  --   --   --   --  2.3  PHOS 3.7  --  1.2*  --  3.1  --  5.4*  --  5.0*   < > = values in this interval not displayed.    Liver Function Tests: Recent Labs  Lab 09/30/19 0839 10/01/19 1650 10/02/19 0551  AST   --  14*  --   ALT  --  9  --   ALKPHOS  --  59  --   BILITOT  --  0.7  --   PROT  --  6.4*  --   ALBUMIN 3.6 3.6 3.4*   Recent Labs  Lab 10/01/19 1650  LIPASE 22   No results for input(s): AMMONIA in the last 168 hours.  CBC: Recent Labs  Lab 09/26/19 0335 09/30/19 0839 10/01/19 1650 10/02/19 0551  WBC 4.4 4.0 9.4 6.1  NEUTROABS 3.1  --  6.8  --   HGB 8.2* 8.3* 9.7* 7.8*  HCT 25.7* 24.9* 30.0* 24.0*  MCV 90.2 88.9 91.2 89.2  PLT 119* 170 300 162    Cardiac Enzymes: No results for input(s): CKTOTAL, CKMB, CKMBINDEX, TROPONINI in the last 168 hours.  BNP: Invalid input(s): POCBNP  CBG: Recent Labs  Lab 09/30/19 1157  10/01/19 1646 10/01/19 1934 10/01/19 2325 10/02/19 0554  GLUCAP 89 169* 162* 139* 139*    Microbiology: Results for orders placed or performed during the hospital encounter of 10/01/19  SARS CORONAVIRUS 2 (TAT 6-24 HRS) Nasopharyngeal Nasopharyngeal Swab     Status: None   Collection Time: 10/01/19  4:50 PM   Specimen: Nasopharyngeal Swab  Result Value Ref Range Status   SARS Coronavirus 2 NEGATIVE NEGATIVE Final    Comment: (NOTE) SARS-CoV-2 target nucleic acids are NOT DETECTED. The SARS-CoV-2 RNA is generally detectable in upper and lower respiratory specimens during the acute phase of infection. Negative results do not preclude SARS-CoV-2 infection, do not rule out co-infections with other pathogens, and should not be used as the sole basis for treatment or other patient management decisions. Negative results must be combined with clinical observations, patient history, and epidemiological information. The expected result is Negative. Fact Sheet for Patients: SugarRoll.be Fact Sheet for Healthcare Providers: https://www.woods-mathews.com/ This test is not yet approved or cleared by the [place] FDA and  has been authorized for detection and/or diagnosis of SARS-CoV-2 by FDA under an  Emergency Use Authorization (EUA). This EUA will remain  in effect (meaning this test can be used) for the duration of the COVID-19 declaration under Section 56 4(b)(1) of the Act, 21 U.S.C. section 360bbb-3(b)(1), unless the authorization is terminated or revoked sooner. Performed at [facility], [place] [address] [place], [place] [address]   SARS Coronavirus 2 by RT PCR (hospital order, performed in [facility] hospital lab) Nasopharyngeal Nasopharyngeal Swab     Status: None   Collection Time: 10/01/19 11:04 PM   Specimen: Nasopharyngeal Swab  Result Value Ref Range Status   SARS Coronavirus 2 NEGATIVE NEGATIVE Final    Comment: (NOTE) If result is NEGATIVE SARS-CoV-2 target nucleic acids are NOT DETECTED. The SARS-CoV-2 RNA is generally detectable in upper and lower  respiratory specimens during the acute phase of infection. The lowest  concentration of SARS-CoV-2 viral copies this assay can detect is 250  copies / mL. A negative result does not preclude SARS-CoV-2 infection  and should not be used as the sole basis for treatment or other  patient management decisions.  A negative result may occur with  improper specimen collection / handling, submission of specimen other  than nasopharyngeal swab, presence of viral mutation(s) within the  areas targeted by this assay, and inadequate number of viral copies  (<250 copies / mL). A negative result must be combined with clinical  observations, patient history, and epidemiological information. If result is POSITIVE SARS-CoV-2 target nucleic acids are DETECTED. The SARS-CoV-2 RNA is generally detectable in upper and lower  respiratory specimens dur ing the acute phase of infection.  Positive  results are indicative of active infection with SARS-CoV-2.  Clinical  correlation with patient history and other diagnostic information is  necessary to determine patient infection status.  Positive results do  not rule out bacterial  infection or co-infection with other viruses. If result is PRESUMPTIVE POSTIVE SARS-CoV-2 nucleic acids MAY BE PRESENT.   A presumptive positive result was obtained on the submitted specimen  and confirmed on repeat testing.  While 2019 novel coronavirus  (SARS-CoV-2) nucleic acids may be present in the submitted sample  additional confirmatory testing may be necessary for epidemiological  and / or clinical management purposes  to differentiate between  SARS-CoV-2 and other Sarbecovirus currently known to infect humans.  If clinically indicated additional testing with an alternate test  methodology [phone]) is advised. The SARS-CoV-2 RNA is generally  detectable in upper and lower respiratory sp ecimens during the acute  phase of infection. The expected result is Negative. Fact Sheet for Patients:  StrictlyIdeas.no Fact Sheet for Healthcare Providers: BankingDealers.co.za This test is not yet approved or cleared by the [place] FDA and has been authorized for detection and/or diagnosis of SARS-CoV-2 by FDA under an Emergency Use Authorization (EUA).  This EUA will remain in effect (meaning this test can be used) for the duration of the COVID-19 declaration under Section 564(b)(1) of the Act, 21 U.S.C. section 360bbb-3(b)(1), unless the authorization is terminated or revoked sooner. Performed at [facility], [place] [place], [place] [address]     Coagulation Studies: No results for input(s): LABPROT, INR in the last 72 hours.  Urinalysis: No results for input(s): COLORURINE, LABSPEC, PHURINE, GLUCOSEU, HGBUR, BILIRUBINUR, KETONESUR, PROTEINUR, UROBILINOGEN, NITRITE, LEUKOCYTESUR in the last 72 hours.  Invalid input(s): APPERANCEUR    Imaging: Dg Chest 1 View  Result Date: 10/01/2019 CLINICAL DATA:  Shortness of breath, respiratory distress EXAM: CHEST  1 VIEW COMPARISON:  09/24/2019 FINDINGS: Multifocal  interstitial/airspace opacities, lower lobe predominant. This appearance is new/progressive from the prior. No pleural effusion or pneumothorax. The heart is normal in size.  Thoracic aortic atherosclerosis. Degenerative changes of the visualized thoracolumbar spine. IMPRESSION: Multifocal interstitial/airspace opacities, possibly reflecting interstitial edema given the clinical history. No associated pleural effusions. Technically speaking, in the appropriate clinical setting, atypical/viral pneumonia (including COVID) is within the differential. Electronically Signed   By: Julian Hy M.D.   On: 10/01/2019 17:30     Medications:   . sodium chloride     . amLODipine  10 mg Oral Daily  . aspirin  81 mg Oral Daily  . carvedilol  12.5 mg Oral BID WC  . chlorhexidine  15 mL Mouth Rinse BID  . Chlorhexidine Gluconate Cloth  6 each Topical Daily  . clopidogrel  75 mg Oral Daily  . heparin  5,000 Units Subcutaneous Q8H  . hydrALAZINE  50 mg Oral Q8H  . insulin aspart  0-9 Units Subcutaneous Q4H  . loratadine  10 mg Oral Daily  . losartan  50 mg Oral Daily  . mouth rinse  15 mL Mouth Rinse q12n4p  . mometasone-formoterol  2 puff Inhalation BID  . oxybutynin  15 mg Oral Daily  . pantoprazole  40 mg Oral Daily  . pravastatin  40 mg Oral QHS  . scopolamine  1 patch Transdermal Q72H  . sodium chloride flush  3 mL Intravenous Q12H  . [START ON 10/03/2019] torsemide  40 mg Oral Q M,W,F,Su-1800   sodium chloride, acetaminophen **OR** acetaminophen, albuterol, HYDROcodone-acetaminophen, ondansetron **OR** ondansetron (ZOFRAN) IV, topiramate  Assessment/ Plan:  [name] is a 73 y.o. white female with end stage renal disease on hemodialysis, hypertension, diabetes mellitus type II, overactive bladder, gout, COPD, congestive heart failure, coronary artery disease, aortic atherosclerosis admitted to [facility] on 10/01/2019 for Respiratory distress [R06.03] ESRD (end stage renal disease)  ([place]) [N18.6] Healthcare-associated pneumonia [J18.9] Other hypervolemia [E87.79] Sepsis, due to unspecified organism, unspecified whether acute organ dysfunction present ([place]) [A41.9]  [facility] TTS 60kg  #. ESRD: with volume overload/acute exacerbation of congestive heart failure/aortic stenosis.  Seen and examined on hemodialysis treatment. UF goal of 2.5 liters Evaluate daily for dialysis need.  Ultrafiltration limited by aortic stenosis.   #. Anemia of CKD: hemoglobin 7.8 - EPO with HD treatment  #.  SHPTH - holding binders    # Hypertension: home regimen of losartan, torsemide, carvedilol, hydralazine.    LOS: 1 Pallavi Clifton 11/12/202011:28 AM

## 2019-10-02 NOTE — Progress Notes (Signed)
HD Initiated:    10/02/19 1030  Vital Signs  Temp 98.4 F (36.9 C)  Temp Source Oral  Pulse Rate 85  Pulse Rate Source Monitor  Resp (!) 23  BP (!) 127/48  BP Location Right Arm  BP Method Automatic  Patient Position (if appropriate) Lying  Oxygen Therapy  SpO2 98 %  O2 Device Nasal Cannula  Heater temperature 36.5 F (2.5 C)  Pain Assessment  Pain Scale 0-10  Pain Score 0  During Hemodialysis Assessment  Blood Flow Rate (mL/min) 400 mL/min  Arterial Pressure (mmHg) -290 mmHg  Venous Pressure (mmHg) 70 mmHg  Transmembrane Pressure (mmHg) 90 mmHg  Ultrafiltration Rate (mL/min) 1000 mL/min  Dialysate Flow Rate (mL/min) 600 ml/min  Conductivity: Machine  13.9  HD Safety Checks Performed Yes  Dialysis Fluid Bolus Normal Saline  Bolus Amount (mL) 250 mL  Intra-Hemodialysis Comments Tx initiated

## 2019-10-02 NOTE — Progress Notes (Signed)
Post HD Assessment:    10/02/19 1002  Neurological  Level of Consciousness Alert  Orientation Level Oriented X4  Respiratory  Respiratory Pattern Regular;Unlabored  Chest Assessment Chest expansion symmetrical  Bilateral Breath Sounds Diminished  Cardiac  Pulse Regular  Heart Sounds S1, S2  Cardiac Rhythm NSR  Vascular  R Radial Pulse +2  L Radial Pulse +2  Integumentary  Integumentary (WDL) X  Skin Color Appropriate for ethnicity  Skin Integrity Ecchymosis  Ecchymosis Location Arm  Ecchymosis Location Orientation Bilateral  Musculoskeletal  Musculoskeletal (WDL) X  Generalized Weakness Yes  GU Assessment  Genitourinary (WDL) X  Genitourinary Symptoms External catheter  Urine Characteristics  Urine Color Amber  Urine Appearance Clear  Psychosocial  Psychosocial (WDL) WDL  Patient Behaviors Cooperative;Calm  Emotional support given Given to patient

## 2019-10-02 NOTE — Progress Notes (Signed)
HD Tx Completed:    10/02/19 1421  Vital Signs  Temp 97.7 F (36.5 C)  Temp Source Oral  Pulse Rate 85  Pulse Rate Source Monitor  Resp 15  BP (!) 135/52  BP Location Right Arm  BP Method Automatic  Patient Position (if appropriate) Lying  During Hemodialysis Assessment  KECN 42 KECN  Dialysis Fluid Bolus Normal Saline  Bolus Amount (mL) 250 mL  Intra-Hemodialysis Comments Tx completed;Tolerated well

## 2019-10-03 ENCOUNTER — Other Ambulatory Visit: Payer: Self-pay

## 2019-10-03 ENCOUNTER — Encounter: Payer: Self-pay | Admitting: Internal Medicine

## 2019-10-03 DIAGNOSIS — I350 Nonrheumatic aortic (valve) stenosis: Secondary | ICD-10-CM

## 2019-10-03 DIAGNOSIS — N186 End stage renal disease: Secondary | ICD-10-CM

## 2019-10-03 DIAGNOSIS — D631 Anemia in chronic kidney disease: Secondary | ICD-10-CM

## 2019-10-03 DIAGNOSIS — J811 Chronic pulmonary edema: Secondary | ICD-10-CM

## 2019-10-03 DIAGNOSIS — D649 Anemia, unspecified: Secondary | ICD-10-CM

## 2019-10-03 DIAGNOSIS — I5033 Acute on chronic diastolic (congestive) heart failure: Secondary | ICD-10-CM

## 2019-10-03 LAB — CBC
HCT: 20.5 % — ABNORMAL LOW (ref 36.0–46.0)
Hemoglobin: 6.7 g/dL — ABNORMAL LOW (ref 12.0–15.0)
MCH: 29.4 pg (ref 26.0–34.0)
MCHC: 32.7 g/dL (ref 30.0–36.0)
MCV: 89.9 fL (ref 80.0–100.0)
Platelets: 143 10*3/uL — ABNORMAL LOW (ref 150–400)
RBC: 2.28 MIL/uL — ABNORMAL LOW (ref 3.87–5.11)
RDW: 15.1 % (ref 11.5–15.5)
WBC: 7.4 10*3/uL (ref 4.0–10.5)
nRBC: 0.3 % — ABNORMAL HIGH (ref 0.0–0.2)

## 2019-10-03 LAB — GLUCOSE, CAPILLARY
Glucose-Capillary: 82 mg/dL (ref 70–99)
Glucose-Capillary: 99 mg/dL (ref 70–99)
Glucose-Capillary: 102 mg/dL — ABNORMAL HIGH (ref 70–99)
Glucose-Capillary: 97 mg/dL (ref 70–99)

## 2019-10-03 LAB — RENAL FUNCTION PANEL
Albumin: 3.3 g/dL — ABNORMAL LOW (ref 3.5–5.0)
Anion gap: 15 (ref 5–15)
BUN: 30 mg/dL — ABNORMAL HIGH (ref 8–23)
CO2: 24 mmol/L (ref 22–32)
Calcium: 8.2 mg/dL — ABNORMAL LOW (ref 8.9–10.3)
Chloride: 99 mmol/L (ref 98–111)
Creatinine, Ser: 4.44 mg/dL — ABNORMAL HIGH (ref 0.44–1.00)
GFR calc Af Amer: 11 mL/min — ABNORMAL LOW (ref >60)
GFR calc non Af Amer: 9 mL/min — ABNORMAL LOW (ref >60)
Glucose, Bld: 83 mg/dL (ref 70–99)
Phosphorus: 3.3 mg/dL (ref 2.5–4.6)
Potassium: 4.3 mmol/L (ref 3.5–5.1)
Sodium: 138 mmol/L (ref 135–145)

## 2019-10-03 LAB — PROCALCITONIN: Procalcitonin: 3.25 ng/mL

## 2019-10-03 MED ORDER — SENNA 8.6 MG PO TABS
1.0000 | ORAL_TABLET | Freq: Two times a day (BID) | ORAL | Status: DC | PRN
  Administered 2019-10-03 – 2019-10-08 (×3): 8.6 mg via ORAL
  Filled 2019-10-03 (×3): qty 1

## 2019-10-03 MED ORDER — SODIUM CHLORIDE 0.9% IV SOLUTION
Freq: Once | INTRAVENOUS | Status: AC
  Administered 2019-10-04: 05:00:00 via INTRAVENOUS

## 2019-10-03 NOTE — Progress Notes (Signed)
   10/03/19 1500  Clinical Encounter Type  Visited With Patient  Visit Type Follow-up  Referral From Palliative care team  Spiritual Encounters  Spiritual Needs Brochure  Stress Factors  Patient Stress Factors Health changes   This chaplain followed up with the patient regarding the completion of HPOA documents. Upon arrival, the patient was sitting up in bed and watching television. She was pleasant and welcoming. The patient confirmed that she is interested in the [facility] paperwork and feels that she "probably needs to complete" the documents since she has no family in the state. The patient confirmed that her nephew lives in [place], so she is concerned about his physical proximity if needs arise. This chaplain provided education and provided the patient with guidance on next steps in the process. The patient reported that she was previously at [facility], but does not wish to return there because she was concerned about the responsiveness of staff to her needs.The patient wishes to go to [facility]. The patient would want to name one of her friends as HPOA, but would need to discuss with them further. She seemed to be relieved to hear that a second person could be named "just in case." This chaplain provided support through active and reflective listening, compassionate presence, encouragement, AD education, and prayer.

## 2019-10-03 NOTE — Progress Notes (Signed)
   10/03/19 1100  Clinical Encounter Type  Visited With Patient;Health care provider  Visit Type Initial  Referral From Palliative care team   Chaplain received a referral from Palliative Care to assist the patient with completion of HPOA paperwork. Upon arrival, the patient was talking on the phone. This chaplain will return to talk with the patient and assess her needs.

## 2019-10-03 NOTE — Plan of Care (Signed)
Pt alert and oriented. Verbalizes understanding of illness and medications. Pdowless,rn

## 2019-10-03 NOTE — Progress Notes (Deleted)
Cardiology Office Note    Date:  10/03/2019   ID:  Phyllis [name], DOB May 11, 1946, MRN [id number]  PCP:  [name], MD  Cardiologist:  Ida Rogue, MD  Electrophysiologist:  None   Chief Complaint: Hospital follow-up  History of Present Illness:   Phyllis [name] is a 73 y.o. female with history of moderate to severe aortic valve stenosis, ESRD on HD TTS, anemia of chronic disease, aortic atherosclerosis, anxiety, DM, HTN, HLD, obesity, asthma, COPD, OSA, and physical deconditioning  who presents for hospital follow-up as detailed below.  Phyllis [name] has a history of chronic chest pain felt to be secondary to costochondritis with negative stress testing in the past.  She has known aortic stenosis that has been followed with periodic echo.  Echo in 04/2019, during admission for atypical chest pain, showed normal LV systolic function, moderate aortic stenosis with a mean gradient 25 mmHg and valve area 0.98 cm.  No further ischemic cardiac work-up was recommended.  She was admitted to the hospital in 05/2019 with volume overload with volume managed by hemodialysis.  There was some concern she may have low flow low gradient severe aortic stenosis.  More recently, she was admitted to the hospital in early 09/2019 with flash pulmonary edema requiring intubation for airway protection and underwent daily hemodialysis with subsequent improvement in volume status.  Repeat echo on 09/21/2019 showed an EF of 60 to 65%, mild LVH, normal RV systolic function and cavity size, mild to moderate left atrial dilatation, mild mitral regurgitation, moderate aortic valve stenosis by gradients and severe stenosis by valve area with a mean gradient of 20.6 mmHg and a valve area of 0.68 cm.  It was recommended her Lasix be changed to torsemide on non dialysis days for further optimization of volume management.  In the setting of her aortic valve disease her euvolemic window has been quite narrow.   She was also treated with a 7-day course of antibiotic for possible pneumonia.  She was noted to be significantly deconditioned with recommendation to pursue aggressive work-up with physical therapy followed by outpatient evaluation by the structural heart team.  She was subsequently discharged to [facility] on 09/30/2019 after undergoing her regular hemodialysis.  She returned to the hospital 2 days after her discharge after developing recurrent acute respiratory distress with hypoxia.  Her presentation was felt to be multifactorial including aortic valve stenosis, anemia, end-stage renal disease on hemodialysis, morbid obesity, and physical deconditioning.  Following dialysis, she noted significant improvement in her symptoms.  During her admission, she was noted to have a downtrending hemoglobin to 6.7 requiring 1 unit of packed red blood cell transfusion.  It was recommended she have close monitoring with regards to her volume status with hemodialysis given a narrow therapeutic window in the setting of her aortic valve disease.  Case was discussed with the structural heart team with recommendation to follow-up with them as an outpatient.  This appointment has been scheduled for 11/23.  ***   Labs: ***  Past Medical History:  Diagnosis Date   (HFpEF) heart failure with preserved ejection fraction ([place])    a. TTE 01/2014: nl LV sys fxn, no valvular abnormalities; b. TTE 11/16: nl EF, mild LVH;  c. 04/2019 Echo: EF 60-65%. DD. Nl RV fxn. Mod AS. 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   Allergy    Anemia of chronic disease    Anxiety    Aortic atherosclerosis ([place])    Asthma  Chronic back pain    COPD (chronic obstructive pulmonary disease) (HCC)    Diabetes mellitus with complication ([place])    ESRD on hemodialysis ([place])    a. Tues/Sat; b. 2/2 small kidneys   Essential hypertension    Fistula    lower left arm   GERD (gastroesophageal reflux disease)    Gout      History of exercise stress test    a. 01/2014: no evidence of ischemia; b. Lexiscan 08/2015: no sig ischemia, severe GI uptake artifact, low risk; c. CPET @ [facility] 09/2016: exercised 3 min 12 sec on bike without incline, 2.28 METs, VO2 of 8.1, 48% of predicted, indicating mod to sev functional impairment, evidence of blunted HR, stroke volume, and BP augmentation as well as ventilation-perfusion mismatch with exercise   HLD (hyperlipidemia)    Moderate aortic stenosis    a. 04/2019 Echo: Mod AS.   Non-obstructive Carotid arterial disease ([place])    a. 12/2017: <50% bilat ICA dzs.   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    right chest   Sleep apnea     Past Surgical History:  Procedure Laterality Date   carpel tunnel     GALLBLADDER SURGERY     PERIPHERAL VASCULAR CATHETERIZATION N/A 04/12/2015   Procedure: A/V Shuntogram/Fistulagram;  Surgeon: Algernon Huxley, MD;  Location: [place] [facility];  Service: Cardiovascular;  Laterality: N/A;   PERIPHERAL VASCULAR CATHETERIZATION N/A 04/12/2015   Procedure: A/V Shunt Intervention;  Surgeon: Algernon Huxley, MD;  Location: [facility];  Service: Cardiovascular;  Laterality: N/A;   PERIPHERAL VASCULAR CATHETERIZATION N/A 06/09/2015   Procedure: Dialysis/Perma Catheter Removal;  Surgeon: Katha Cabal, MD;  Location: [place] [facility];  Service: Cardiovascular;  Laterality: N/A;    Current Medications: No outpatient medications have been marked as taking for the 10/07/19 encounter (Appointment) with Rise Mu, PA-C.    Allergies:   Enalapril maleate; Nitrofurantoin; Sulfamethoxazole-trimethoprim; 2,4-d dimethylamine (amisol); Baclofen; Neosporin [neomycin-bacitracin zn-polymyx]; Quinine; Ultram [tramadol]; Zocor [simvastatin]; Bactrim [sulfamethoxazole-trimethoprim]; Levodopa; Macrodantin [nitrofurantoin macrocrystal]; and Quinine derivatives   Social History   Socioeconomic History   Marital status: Single    Spouse  name: Not on file   Number of children: Not on file   Years of education: Not on file   Highest education level: Not on file  Occupational History   Not on file  Social Needs   Financial resource strain: Not on file   Food insecurity    Worry: Not on file    Inability: Not on file   Transportation needs    Medical: Not on file    Non-medical: Not on file  Tobacco Use   Smoking status: Never Smoker   Smokeless tobacco: Never Used  Substance and Sexual Activity   Alcohol use: No   Drug use: No   Sexual activity: Not Currently  Lifestyle   Physical activity    Days per week: Not on file    Minutes per session: Not on file   Stress: Not on file  Relationships   Social connections    Talks on phone: Not on file    Gets together: Not on file    Attends religious service: Not on file    Active member of club or organization: Not on file    Attends meetings of clubs or organizations: Not on file    Relationship status: Not on file  Other Topics Concern   Not on file  Social History Narrative  Not on file     Family History:  The patient's family history includes Heart attack in her brother; Heart attack (age of onset: 59) in her father; Heart disease in her brother and father; Hyperlipidemia in her father and mother; Hypertension in her father and mother. There is no history of Breast cancer.  ROS:   ROS   EKGs/Labs/Other Studies Reviewed:    Studies reviewed were summarized above. The additional studies were reviewed today:  2D echo 09/21/2019: 1. Left ventricular ejection fraction, by visual estimation, is 60 to 65%. The left ventricle has normal function. There is mildly increased left ventricular hypertrophy. 2. Global right ventricle has normal systolic function.The right ventricular size is normal. No increase in right ventricular wall thickness. 3. Left atrial size was mild-moderately dilated. 4. Mild mitral valve regurgitation. 5. The  tricuspid valve is normal in structure. Tricuspid valve regurgitation is not demonstrated. 6. The aortic valve was not well visualized. Measurements as detailed below, Moderate aortic valve stenosis by gradients, severe stenosis by estimated AVA. 7. TR signal is inadequate for assessing pulmonary artery systolic pressure. 8. Aortic valve mean gradient measures 20.6 mmHg. 9. Aortic valve peak gradient measures 32.0 mmHg. 10. Aortic valve area, by VTI measures 0.68 cm.  EKG:  EKG is ordered today.  The EKG ordered today demonstrates ***  Recent Labs: 11/28/2018: TSH 1.789 10/01/2019: ALT 9; B Natriuretic Peptide 1,854.0 10/02/2019: Magnesium 2.3 10/03/2019: BUN 30; Creatinine, Ser 4.44; Hemoglobin 6.7; Platelets 143; Potassium 4.3; Sodium 138  Recent Lipid Panel    Component Value Date/Time   CHOL 180 01/17/2018 0241   TRIG 154 (H) 01/17/2018 0241   HDL 48 01/17/2018 0241   CHOLHDL 3.8 01/17/2018 0241   VLDL 31 01/17/2018 0241   LDLCALC 101 (H) 01/17/2018 0241    PHYSICAL EXAM:    VS:  There were no vitals taken for this visit.  BMI: There is no height or weight on file to calculate BMI.  Physical Exam  Wt Readings from Last 3 Encounters:  10/02/19 137 lb 5.6 oz (62.3 kg)  09/30/19 138 lb 14.2 oz (63 kg)  09/07/19 137 lb 9.1 oz (62.4 kg)     ASSESSMENT & PLAN:   1. ***  Disposition: F/u with Dr. Rockey Situ or an APP in ***.   Medication Adjustments/Labs and Tests Ordered: Current medicines are reviewed at length with the patient today.  Concerns regarding medicines are outlined above. Medication changes, Labs and Tests ordered today are summarized above and listed in the Patient Instructions accessible in Encounters.   Signed, Christell Faith, PA-C 10/03/2019 3:15 PM     [place],  [phone]

## 2019-10-03 NOTE — Progress Notes (Signed)
Established hemodialysis patient known at [place] 11:45. Normally self transports until recent admission, to which patient was placed at [facility] for rehab. Readmission noted and clinic is aware. Please contact me with any dialysis placement concerns.  Elvera Bicker Dialysis Coordinator [phone]

## 2019-10-03 NOTE — Progress Notes (Addendum)
Progress Note    Phyllis [name]  [id number] DOB: 01-May-1946  DOA: 10/01/2019 PCP: [name], MD      Brief Narrative:    Medical records reviewed and are as summarized below:  Phyllis [name] is an 73 y.o. female with medical history significant for ESRD on hemodialysis (Tuesdays, Thursdays and Saturdays), chronic diastolic CHF, moderate to severe aortic stenosis, COPD, type II DM, hypertension, hyperlipidemia.  She was recently hospitalized from 78 31-11 10 for acute hypoxemic respiratory failure secondary to CHF exacerbation/pulmonary edema that required mechanical ventilation.  She had also received a course of antibiotics for possible pneumonia on that visit.  She was readmitted to the hospital because of increasing shortness of breath and acute hypoxemic respiratory failure.  She was seen in consultation by the nephrologist and she underwent hemodialysis.  She was also seen in consultation by the cardiologist, Dr. Rockey Situ.  Follow-up has been arranged for evaluation for TAVR.      Assessment/Plan:   Principal Problem:   Acute on chronic respiratory failure with hypoxia (HCC) Active Problems:   End stage renal disease on dialysis ([place])   Type 2 diabetes mellitus with other specified complication (HCC)   Acute on chronic diastolic CHF (congestive heart failure) (HCC)   Hyperlipidemia   HTN (hypertension)   Acute on chronic anemia   Body mass index is 25.12 kg/m.    Acute on chronic hypoxemic respiratory failure: Improved.  Taper off oxygen as able.  Acute on chronic anemia/anemia of chronic disease: Transfuse 1 unit of packed red blood cells.  Risks, benefits and alternatives of blood transfusion were discussed with the patient and she is agreeable to proceed with blood transfusion.  She said she has had blood transfusion before and she is okay with that.  Follow-up H&H.  Plan to transfuse was discussed with nephrologist, Dr. Juleen China  ESRD:  Follow-up with nephrologist for hemodialysis.  Acute on chronic diastolic CHF/moderate to severe aortic stenosis: Hemodialysis as scheduled for fluid management.  Patient has been seen by the cardiologist.  Outpatient follow-up for evaluation for TAVR has been arranged.  Type 2 diabetes mellitus: NovoLog as needed.  Hypertension: Continue antihypertensives    Family Communication/Anticipated D/C date and plan/Code Status   DVT prophylaxis: Heparin Code Status: Full code Family Communication: Plan discussed with the patient Disposition Plan: Skilled nursing facility      Subjective:   No shortness of breath, chest pain, dizziness or fatigue.  She feels better.  She said she does not want to go back to doing nursing home where she came from.  She prefers to go to another facility.  Objective:    Vitals:   10/02/19 2111 10/03/19 0457 10/03/19 0745 10/03/19 0942  BP: (!) 121/44 (!) 122/51 (!) 119/53 (!) 116/43  Pulse: 84 79 84 84  Resp: 18 18 17 20   Temp: 98.5 F (36.9 C) 97.6 F (36.4 C) 98.5 F (36.9 C) 98 F (36.7 C)  TempSrc:  Oral Oral Oral  SpO2: 100% 100% 100% 100%  Weight:      Height:        Intake/Output Summary (Last 24 hours) at 10/03/2019 1449 Last data filed at 10/03/2019 1025 Gross per 24 hour  Intake 120 ml  Output --  Net 120 ml   Filed Weights   10/01/19 1647 10/02/19 1000  Weight: 63 kg 62.3 kg    Exam:  GEN: NAD SKIN: No rash EYES: EOMI ENT: MMM CV: RRR PULM: CTA B  ABD: soft, ND, NT, +BS CNS: AAO x 3, non focal EXT: No edema or tenderness   Data Reviewed:   I have personally reviewed following labs and imaging studies:  Labs: Labs show the following:   Basic Metabolic Panel: Recent Labs  Lab 09/27/19 1919  09/28/19 0504 09/28/19 1807 09/29/19 0830 09/30/19 0839 10/01/19 1650 10/02/19 0551 10/03/19 0639  NA  --    < > 139  --  136 134* 136 136 138  K  --    < > 4.1  --  4.3 4.8 4.7 5.1 4.3  CL  --    < > 99  --   97* 95* 95* 97* 99  CO2  --    < > 31  --  27 27 28 26 24   GLUCOSE  --    < > 99  --  110* 103* 167* 153* 83  BUN  --    < > 16  --  26* 38* 31* 40* 30*  CREATININE  --   --  2.62*  --  4.49* 5.91* 4.63* 5.58* 4.44*  CALCIUM  --    < > 8.2*  --  8.6* 8.6* 8.8* 8.7* 8.2*  MG  --   --  2.1  --   --   --   --  2.3  --   PHOS 1.2*  --   --  3.1  --  5.4*  --  5.0* 3.3   < > = values in this interval not displayed.   GFR Estimated Creatinine Clearance: 9.8 mL/min (A) (by C-G formula based on SCr of 4.44 mg/dL (H)). Liver Function Tests: Recent Labs  Lab 09/30/19 0839 10/01/19 1650 10/02/19 0551 10/03/19 0639  AST  --  14*  --   --   ALT  --  9  --   --   ALKPHOS  --  59  --   --   BILITOT  --  0.7  --   --   PROT  --  6.4*  --   --   ALBUMIN 3.6 3.6 3.4* 3.3*   Recent Labs  Lab 10/01/19 1650  LIPASE 22   No results for input(s): AMMONIA in the last 168 hours. Coagulation profile No results for input(s): INR, PROTIME in the last 168 hours.  CBC: Recent Labs  Lab 09/30/19 0839 10/01/19 1650 10/02/19 0551 10/03/19 0639  WBC 4.0 9.4 6.1 7.4  NEUTROABS  --  6.8  --   --   HGB 8.3* 9.7* 7.8* 6.7*  HCT 24.9* 30.0* 24.0* 20.5*  MCV 88.9 91.2 89.2 89.9  PLT 170 300 162 143*   Cardiac Enzymes: No results for input(s): CKTOTAL, CKMB, CKMBINDEX, TROPONINI in the last 168 hours. BNP (last 3 results) No results for input(s): PROBNP in the last 8760 hours. CBG: Recent Labs  Lab 10/02/19 1310 10/02/19 1725 10/02/19 2202 10/03/19 0747 10/03/19 1138  GLUCAP 162* 158* 70 82 99   D-Dimer: No results for input(s): DDIMER in the last 72 hours. Hgb A1c: No results for input(s): HGBA1C in the last 72 hours. Lipid Profile: No results for input(s): CHOL, HDL, LDLCALC, TRIG, CHOLHDL, LDLDIRECT in the last 72 hours. Thyroid function studies: No results for input(s): TSH, T4TOTAL, T3FREE, THYROIDAB in the last 72 hours.  Invalid input(s): FREET3 Anemia work up: No results for  input(s): VITAMINB12, FOLATE, FERRITIN, TIBC, IRON, RETICCTPCT in the last 72 hours. Sepsis Labs: Recent Labs  Lab 09/30/19 [id number] 10/01/19 1650 10/01/19 1836  10/01/19 1923 10/02/19 0551 10/03/19 0639  PROCALCITON  --  0.92  --   --  1.23 3.25  WBC 4.0 9.4  --   --  6.1 7.4  LATICACIDVEN  --   --  0.7 0.7  --   --     Microbiology Recent Results (from the past 240 hour(s))  SARS CORONAVIRUS 2 (TAT 6-24 HRS) Nasopharyngeal Nasopharyngeal Swab     Status: None   Collection Time: 10/01/19  4:50 PM   Specimen: Nasopharyngeal Swab  Result Value Ref Range Status   SARS Coronavirus 2 NEGATIVE NEGATIVE Final    Comment: (NOTE) SARS-CoV-2 target nucleic acids are NOT DETECTED. The SARS-CoV-2 RNA is generally detectable in upper and lower respiratory specimens during the acute phase of infection. Negative results do not preclude SARS-CoV-2 infection, do not rule out co-infections with other pathogens, and should not be used as the sole basis for treatment or other patient management decisions. Negative results must be combined with clinical observations, patient history, and epidemiological information. The expected result is Negative. Fact Sheet for Patients: SugarRoll.be Fact Sheet for Healthcare Providers: https://www.woods-mathews.com/ This test is not yet approved or cleared by the [place] FDA and  has been authorized for detection and/or diagnosis of SARS-CoV-2 by FDA under an Emergency Use Authorization (EUA). This EUA will remain  in effect (meaning this test can be used) for the duration of the COVID-19 declaration under Section 56 4(b)(1) of the Act, 21 U.S.C. section 360bbb-3(b)(1), unless the authorization is terminated or revoked sooner. Performed at [facility], [place] [address], [place], [place] [address]   SARS Coronavirus 2 by RT PCR (hospital order, performed in [facility] hospital lab) Nasopharyngeal  Nasopharyngeal Swab     Status: None   Collection Time: 10/01/19 11:04 PM   Specimen: Nasopharyngeal Swab  Result Value Ref Range Status   SARS Coronavirus 2 NEGATIVE NEGATIVE Final    Comment: (NOTE) If result is NEGATIVE SARS-CoV-2 target nucleic acids are NOT DETECTED. The SARS-CoV-2 RNA is generally detectable in upper and lower  respiratory specimens during the acute phase of infection. The lowest  concentration of SARS-CoV-2 viral copies this assay can detect is 250  copies / mL. A negative result does not preclude SARS-CoV-2 infection  and should not be used as the sole basis for treatment or other  patient management decisions.  A negative result may occur with  improper specimen collection / handling, submission of specimen other  than nasopharyngeal swab, presence of viral mutation(s) within the  areas targeted by this assay, and inadequate number of viral copies  (<250 copies / mL). A negative result must be combined with clinical  observations, patient history, and epidemiological information. If result is POSITIVE SARS-CoV-2 target nucleic acids are DETECTED. The SARS-CoV-2 RNA is generally detectable in upper and lower  respiratory specimens dur ing the acute phase of infection.  Positive  results are indicative of active infection with SARS-CoV-2.  Clinical  correlation with patient history and other diagnostic information is  necessary to determine patient infection status.  Positive results do  not rule out bacterial infection or co-infection with other viruses. If result is PRESUMPTIVE POSTIVE SARS-CoV-2 nucleic acids MAY BE PRESENT.   A presumptive positive result was obtained on the submitted specimen  and confirmed on repeat testing.  While 2019 novel coronavirus  (SARS-CoV-2) nucleic acids may be present in the submitted sample  additional confirmatory testing may be necessary for epidemiological  and / or clinical  management purposes  to differentiate between    SARS-CoV-2 and other Sarbecovirus currently known to infect humans.  If clinically indicated additional testing with an alternate test  methodology [phone]) is advised. The SARS-CoV-2 RNA is generally  detectable in upper and lower respiratory sp ecimens during the acute  phase of infection. The expected result is Negative. Fact Sheet for Patients:  StrictlyIdeas.no Fact Sheet for Healthcare Providers: BankingDealers.co.za This test is not yet approved or cleared by the [place] FDA and has been authorized for detection and/or diagnosis of SARS-CoV-2 by FDA under an Emergency Use Authorization (EUA).  This EUA will remain in effect (meaning this test can be used) for the duration of the COVID-19 declaration under Section 564(b)(1) of the Act, 21 U.S.C. section 360bbb-3(b)(1), unless the authorization is terminated or revoked sooner. Performed at [facility], [address] [place], [place] [address]     Procedures and diagnostic studies:  Dg Chest 1 View  Result Date: 10/01/2019 CLINICAL DATA:  Shortness of breath, respiratory distress EXAM: CHEST  1 VIEW COMPARISON:  09/24/2019 FINDINGS: Multifocal interstitial/airspace opacities, lower lobe predominant. This appearance is new/progressive from the prior. No pleural effusion or pneumothorax. The heart is normal in size.  Thoracic aortic atherosclerosis. Degenerative changes of the visualized thoracolumbar spine. IMPRESSION: Multifocal interstitial/airspace opacities, possibly reflecting interstitial edema given the clinical history. No associated pleural effusions. Technically speaking, in the appropriate clinical setting, atypical/viral pneumonia (including COVID) is within the differential. Electronically Signed   By: Julian Hy M.D.   On: 10/01/2019 17:30    Medications:    sodium chloride   Intravenous Once   amLODipine  10 mg Oral Daily   aspirin  81 mg  Oral Daily   carvedilol  12.5 mg Oral BID WC   chlorhexidine  15 mL Mouth Rinse BID   Chlorhexidine Gluconate Cloth  6 each Topical Daily   clopidogrel  75 mg Oral Daily   heparin  5,000 Units Subcutaneous Q8H   hydrALAZINE  50 mg Oral Q8H   insulin aspart  0-9 Units Subcutaneous TID AC & HS   loratadine  10 mg Oral Daily   losartan  50 mg Oral Daily   mouth rinse  15 mL Mouth Rinse q12n4p   mometasone-formoterol  2 puff Inhalation BID   oxybutynin  15 mg Oral Daily   pantoprazole  40 mg Oral Daily   pravastatin  40 mg Oral QHS   scopolamine  1 patch Transdermal Q72H   sodium chloride flush  3 mL Intravenous Q12H   torsemide  40 mg Oral Q M,W,F,Su-1800   Continuous Infusions:  sodium chloride       LOS: 2 days   Jamell Opfer  [facility] Pager [phone].   *Please refer to [email or web address], password [email or web address] to get updated schedule on who will round on this patient, as hospitalists switch teams weekly. If 7PM-7AM, please contact night-coverage at [email or web address], password [email or web address] for any overnight needs.  10/03/2019, 2:49 PM

## 2019-10-03 NOTE — Progress Notes (Signed)
Progress Note  [name] Date of Encounter: 10/03/2019  Primary Cardiologist: Rockey Situ  Subjective   No complaints this morning. Transferred out of the ICU on 11/12. Has outpatient follow up with structural heart clinic 11/23. She does not want to go back to [facility]. Prefers [facility]. No HD today. Torsemide 40 mg is scheduled for non HD days.   Inpatient Medications    Scheduled Meds: . amLODipine  10 mg Oral Daily  . aspirin  81 mg Oral Daily  . carvedilol  12.5 mg Oral BID WC  . chlorhexidine  15 mL Mouth Rinse BID  . Chlorhexidine Gluconate Cloth  6 each Topical Daily  . clopidogrel  75 mg Oral Daily  . heparin  5,000 Units Subcutaneous Q8H  . hydrALAZINE  50 mg Oral Q8H  . insulin aspart  0-9 Units Subcutaneous TID AC & HS  . loratadine  10 mg Oral Daily  . losartan  50 mg Oral Daily  . mouth rinse  15 mL Mouth Rinse q12n4p  . mometasone-formoterol  2 puff Inhalation BID  . oxybutynin  15 mg Oral Daily  . pantoprazole  40 mg Oral Daily  . pravastatin  40 mg Oral QHS  . scopolamine  1 patch Transdermal Q72H  . sodium chloride flush  3 mL Intravenous Q12H  . torsemide  40 mg Oral Q M,W,F,Su-1800   Continuous Infusions: . sodium chloride     PRN Meds: sodium chloride, acetaminophen **OR** acetaminophen, albuterol, HYDROcodone-acetaminophen, ondansetron **OR** ondansetron (ZOFRAN) IV, topiramate   Vital Signs    Vitals:   10/02/19 1728 10/02/19 2000 10/02/19 2111 10/03/19 0457  BP: (!) 117/39 111/74 (!) 121/44 (!) 122/51  Pulse:  88 84 79  Resp:  (!) 21 18 18   Temp:   98.5 F (36.9 C) 97.6 F (36.4 C)  TempSrc:    Oral  SpO2:  99% 100% 100%  Weight:      Height:        Intake/Output Summary (Last 24 hours) at 10/03/2019 0745 Last data filed at 10/02/2019 1430 Gross per 24 hour  Intake 240 ml  Output 2271 ml  Net -2031 ml   Filed Weights   10/01/19 1647 10/02/19 1000  Weight: 63 kg 62.3 kg    Telemetry     SR - Personally Reviewed  ECG    No new tracings - Personally Reviewed  Physical Exam   GEN: No acute distress.   Neck: No JVD. Cardiac: RRR, II/VI systolic murmur RUSB, no rubs, or gallops.  Respiratory: Clear to auscultation bilaterally.  GI: Soft, nontender, non-distended.   MS: No edema; No deformity. Neuro:  Alert and oriented x 3; Nonfocal.  Psych: Normal affect.  Labs    Chemistry Recent Labs  Lab 09/30/19 0839 10/01/19 1650 10/02/19 0551  NA 134* 136 136  K 4.8 4.7 5.1  CL 95* 95* 97*  CO2 27 28 26   GLUCOSE 103* 167* 153*  BUN 38* 31* 40*  CREATININE 5.91* 4.63* 5.58*  CALCIUM 8.6* 8.8* 8.7*  PROT  --  6.4*  --   ALBUMIN 3.6 3.6 3.4*  AST  --  14*  --   ALT  --  9  --   ALKPHOS  --  59  --   BILITOT  --  0.7  --   GFRNONAA 7* 9* 7*  GFRAA 8* 10* 8*  ANIONGAP 12 13 13      Hematology Recent Labs  Lab 10/01/19  1650 10/02/19 0551 10/03/19 0639  WBC 9.4 6.1 7.4  RBC 3.29* 2.69* 2.28*  HGB 9.7* 7.8* 6.7*  HCT 30.0* 24.0* 20.5*  MCV 91.2 89.2 89.9  MCH 29.5 29.0 29.4  MCHC 32.3 32.5 32.7  RDW 14.7 14.6 15.1  PLT 300 162 143*    Cardiac EnzymesNo results for input(s): TROPONINI in the last 168 hours. No results for input(s): TROPIPOC in the last 168 hours.   BNP Recent Labs  Lab 10/01/19 1650  BNP 1,854.0*     DDimer No results for input(s): DDIMER in the last 168 hours.   Radiology    Dg Chest 1 View  Result Date: 10/01/2019 IMPRESSION: Multifocal interstitial/airspace opacities, possibly reflecting interstitial edema given the clinical history. No associated pleural effusions. Technically speaking, in the appropriate clinical setting, atypical/viral pneumonia (including COVID) is within the differential. Electronically Signed   By: Julian Hy M.D.   On: 10/01/2019 17:30    Cardiac Studies   2D echo 09/21/2019: 1. Left ventricular ejection fraction, by visual estimation, is 60 to 65%. The left ventricle has normal function.  There is mildly increased left ventricular hypertrophy. 2. Global right ventricle has normal systolic function.The right ventricular size is normal. No increase in right ventricular wall thickness. 3. Left atrial size was mild-moderately dilated. 4. Mild mitral valve regurgitation. 5. The tricuspid valve is normal in structure. Tricuspid valve regurgitation is not demonstrated. 6. The aortic valve was not well visualized. Measurements as detailed below, Moderate aortic valve stenosis by gradients, severe stenosis by estimated AVA. 7. TR signal is inadequate for assessing pulmonary artery systolic pressure. 8. Aortic valve mean gradient measures 20.6 mmHg. 9. Aortic valve peak gradient measures 32.0 mmHg. 10. Aortic valve area, by VTI measures 0.68 cm.  Patient Profile     73 y.o. female with history of moderate to severe aortic valve stenosis, ESRD on HD TTS, anemia of chronic disease, aortic atherosclerosis, anxiety, DM, HTN, HLD, obesity, asthma, COPD, OSA, and physical deconditioning who is being seen today for the evaluation of dyspnea.  Assessment & Plan    1. Dyspnea: -Improved -Likely multifactorial including volume overload with narrow therapeutic/euvolemic window with ultrafiltration being limited by aortic stenosis, moderate to severe aortic stenosis, anemia of chronic disease, morbid obesity and significant physical deconditioning with possible component of underlying anxiety  2.  Moderate to severe aortic valve stenosis: -Measurements reviewed by structural heart team and felt to not be severe at this time -Nonetheless, she does have a narrow therapeutic window given her valvular heart disease -Continue with physical therapy to work on her conditioning and follow-up with structural heart team on 11/23 as an outpatient for TEE, imaging, and diagnostic cath as indicated   3.  ESRD: -Volume management per nephrology -Torsemide 40 mg on non-HD days  4.  Anemia of  chronic disease: -Most recent hemoglobin 6.7 -EPO with hemodialysis treatment -Managed by nephrology  5.  Morbid obesity with significant physical deconditioning: -Continue to work with PT -Weight loss advised   For questions or updates, please contact [place] Please consult [email or web address] for contact info under Cardiology/STEMI.    Signed, Christell Faith, PA-C [place] Pager: [phone] 10/03/2019, 7:45 AM

## 2019-10-03 NOTE — Progress Notes (Signed)
[facility]  ROUNDING NOTE   Subjective:   Emergent hemodialysis treatment yesterday. UF fo 2.2 liters.   Weaned down to 1 liter [place] [facility].   Hemoglobin 6.7  Objective:  Vital signs in last 24 hours:  Temp:  [97.6 F (36.4 C)-98.5 F (36.9 C)] 98 F (36.7 C) (11/13 0942) Pulse Rate:  [79-91] 84 (11/13 0942) Resp:  [14-21] 20 (11/13 0942) BP: (103-147)/(39-74) 116/43 (11/13 0942) SpO2:  [85 %-100 %] 100 % (11/13 0942)  Weight change: -0.7 kg Filed Weights   10/01/19 1647 10/02/19 1000  Weight: 63 kg 62.3 kg    Intake/Output: I/O last 3 completed shifts: In: 740 [P.O.:240; IV Piggyback:500] Out: 2371 [Urine:100; Other:2271]   Intake/Output this shift:  Total I/O In: 120 [P.O.:120] Out: -   Physical Exam: General: Ill, sitting in bed  Head: [place]/AT  Eyes: PERRL  Neck: Supple, trachea midline  Lungs:   Coarse breath sounds bilaterally  Heart: Regular rate and rhythm, +murmur  Abdomen:  Soft, nontender, obese  Extremities: trace peripheral edema.  Neurologic: Nonfocal, moving all four extremities  Skin: No lesions  Access: Left AVF    Basic Metabolic Panel: Recent Labs  Lab 09/27/19 1919  09/28/19 0504 09/28/19 1807 09/29/19 0830 09/30/19 0839 10/01/19 1650 10/02/19 0551 10/03/19 0639  NA  --    < > 139  --  136 134* 136 136 138  K  --    < > 4.1  --  4.3 4.8 4.7 5.1 4.3  CL  --    < > 99  --  97* 95* 95* 97* 99  CO2  --    < > 31  --  27 27 28 26 24   GLUCOSE  --    < > 99  --  110* 103* 167* 153* 83  BUN  --    < > 16  --  26* 38* 31* 40* 30*  CREATININE  --   --  2.62*  --  4.49* 5.91* 4.63* 5.58* 4.44*  CALCIUM  --    < > 8.2*  --  8.6* 8.6* 8.8* 8.7* 8.2*  MG  --   --  2.1  --   --   --   --  2.3  --   PHOS 1.2*  --   --  3.1  --  5.4*  --  5.0* 3.3   < > = values in this interval not displayed.    Liver Function Tests: Recent Labs  Lab 09/30/19 0839 10/01/19 1650 10/02/19 0551 10/03/19 0639  AST  --  14*  --   --   ALT   --  9  --   --   ALKPHOS  --  59  --   --   BILITOT  --  0.7  --   --   PROT  --  6.4*  --   --   ALBUMIN 3.6 3.6 3.4* 3.3*   Recent Labs  Lab 10/01/19 1650  LIPASE 22   No results for input(s): AMMONIA in the last 168 hours.  CBC: Recent Labs  Lab 09/30/19 0839 10/01/19 1650 10/02/19 0551 10/03/19 0639  WBC 4.0 9.4 6.1 7.4  NEUTROABS  --  6.8  --   --   HGB 8.3* 9.7* 7.8* 6.7*  HCT 24.9* 30.0* 24.0* 20.5*  MCV 88.9 91.2 89.2 89.9  PLT 170 300 162 143*    Cardiac Enzymes: No results for input(s): CKTOTAL, CKMB, CKMBINDEX, TROPONINI in the last  168 hours.  BNP: Invalid input(s): POCBNP  CBG: Recent Labs  Lab 10/02/19 0554 10/02/19 1310 10/02/19 1725 10/02/19 2202 10/03/19 0747  GLUCAP 139* 162* 158* 70 82    Microbiology: Results for orders placed or performed during the hospital encounter of 10/01/19  SARS CORONAVIRUS 2 (TAT 6-24 HRS) Nasopharyngeal Nasopharyngeal Swab     Status: None   Collection Time: 10/01/19  4:50 PM   Specimen: Nasopharyngeal Swab  Result Value Ref Range Status   SARS Coronavirus 2 NEGATIVE NEGATIVE Final    Comment: (NOTE) SARS-CoV-2 target nucleic acids are NOT DETECTED. The SARS-CoV-2 RNA is generally detectable in upper and lower respiratory specimens during the acute phase of infection. Negative results do not preclude SARS-CoV-2 infection, do not rule out co-infections with other pathogens, and should not be used as the sole basis for treatment or other patient management decisions. Negative results must be combined with clinical observations, patient history, and epidemiological information. The expected result is Negative. Fact Sheet for Patients: SugarRoll.be Fact Sheet for Healthcare Providers: https://www.woods-mathews.com/ This test is not yet approved or cleared by the [place] FDA and  has been authorized for detection and/or diagnosis of SARS-CoV-2 by FDA under an  Emergency Use Authorization (EUA). This EUA will remain  in effect (meaning this test can be used) for the duration of the COVID-19 declaration under Section 56 4(b)(1) of the Act, 21 U.S.C. section 360bbb-3(b)(1), unless the authorization is terminated or revoked sooner. Performed at [facility], [place] [address] [place], [place] [address]   SARS Coronavirus 2 by RT PCR (hospital order, performed in [facility] hospital lab) Nasopharyngeal Nasopharyngeal Swab     Status: None   Collection Time: 10/01/19 11:04 PM   Specimen: Nasopharyngeal Swab  Result Value Ref Range Status   SARS Coronavirus 2 NEGATIVE NEGATIVE Final    Comment: (NOTE) If result is NEGATIVE SARS-CoV-2 target nucleic acids are NOT DETECTED. The SARS-CoV-2 RNA is generally detectable in upper and lower  respiratory specimens during the acute phase of infection. The lowest  concentration of SARS-CoV-2 viral copies this assay can detect is 250  copies / mL. A negative result does not preclude SARS-CoV-2 infection  and should not be used as the sole basis for treatment or other  patient management decisions.  A negative result may occur with  improper specimen collection / handling, submission of specimen other  than nasopharyngeal swab, presence of viral mutation(s) within the  areas targeted by this assay, and inadequate number of viral copies  (<250 copies / mL). A negative result must be combined with clinical  observations, patient history, and epidemiological information. If result is POSITIVE SARS-CoV-2 target nucleic acids are DETECTED. The SARS-CoV-2 RNA is generally detectable in upper and lower  respiratory specimens dur ing the acute phase of infection.  Positive  results are indicative of active infection with SARS-CoV-2.  Clinical  correlation with patient history and other diagnostic information is  necessary to determine patient infection status.  Positive results do  not rule out bacterial  infection or co-infection with other viruses. If result is PRESUMPTIVE POSTIVE SARS-CoV-2 nucleic acids MAY BE PRESENT.   A presumptive positive result was obtained on the submitted specimen  and confirmed on repeat testing.  While 2019 novel coronavirus  (SARS-CoV-2) nucleic acids may be present in the submitted sample  additional confirmatory testing may be necessary for epidemiological  and / or clinical management purposes  to differentiate between  SARS-CoV-2 and other Sarbecovirus currently  known to infect humans.  If clinically indicated additional testing with an alternate test  methodology [id number]) is advised. The SARS-CoV-2 RNA is generally  detectable in upper and lower respiratory sp ecimens during the acute  phase of infection. The expected result is Negative. Fact Sheet for Patients:  StrictlyIdeas.no Fact Sheet for Healthcare Providers: BankingDealers.co.za This test is not yet approved or cleared by the [place] FDA and has been authorized for detection and/or diagnosis of SARS-CoV-2 by FDA under an Emergency Use Authorization (EUA).  This EUA will remain in effect (meaning this test can be used) for the duration of the COVID-19 declaration under Section 564(b)(1) of the Act, 21 U.S.C. section 360bbb-3(b)(1), unless the authorization is terminated or revoked sooner. Performed at [facility], [address] [place], [place] [address]     Coagulation Studies: No results for input(s): LABPROT, INR in the last 72 hours.  Urinalysis: No results for input(s): COLORURINE, LABSPEC, PHURINE, GLUCOSEU, HGBUR, BILIRUBINUR, KETONESUR, PROTEINUR, UROBILINOGEN, NITRITE, LEUKOCYTESUR in the last 72 hours.  Invalid input(s): APPERANCEUR    Imaging: Dg Chest 1 View  Result Date: 10/01/2019 CLINICAL DATA:  Shortness of breath, respiratory distress EXAM: CHEST  1 VIEW COMPARISON:  09/24/2019 FINDINGS: Multifocal  interstitial/airspace opacities, lower lobe predominant. This appearance is new/progressive from the prior. No pleural effusion or pneumothorax. The heart is normal in size.  Thoracic aortic atherosclerosis. Degenerative changes of the visualized thoracolumbar spine. IMPRESSION: Multifocal interstitial/airspace opacities, possibly reflecting interstitial edema given the clinical history. No associated pleural effusions. Technically speaking, in the appropriate clinical setting, atypical/viral pneumonia (including COVID) is within the differential. Electronically Signed   By: Julian Hy M.D.   On: 10/01/2019 17:30     Medications:   . sodium chloride     . amLODipine  10 mg Oral Daily  . aspirin  81 mg Oral Daily  . carvedilol  12.5 mg Oral BID WC  . chlorhexidine  15 mL Mouth Rinse BID  . Chlorhexidine Gluconate Cloth  6 each Topical Daily  . clopidogrel  75 mg Oral Daily  . heparin  5,000 Units Subcutaneous Q8H  . hydrALAZINE  50 mg Oral Q8H  . insulin aspart  0-9 Units Subcutaneous TID AC & HS  . loratadine  10 mg Oral Daily  . losartan  50 mg Oral Daily  . mouth rinse  15 mL Mouth Rinse q12n4p  . mometasone-formoterol  2 puff Inhalation BID  . oxybutynin  15 mg Oral Daily  . pantoprazole  40 mg Oral Daily  . pravastatin  40 mg Oral QHS  . scopolamine  1 patch Transdermal Q72H  . sodium chloride flush  3 mL Intravenous Q12H  . torsemide  40 mg Oral Q M,W,F,Su-1800   sodium chloride, acetaminophen **OR** acetaminophen, albuterol, HYDROcodone-acetaminophen, ondansetron **OR** ondansetron (ZOFRAN) IV, senna, topiramate  Assessment/ Plan:  Ms. NEETU BOKA is a 73 y.o. white female with end stage renal disease on hemodialysis, hypertension, diabetes mellitus type II, overactive bladder, gout, COPD, congestive heart failure, coronary artery disease, aortic atherosclerosis admitted to [facility] on 10/01/2019 for Respiratory distress [R06.03] ESRD (end stage renal disease) ([place])  [N18.6] Healthcare-associated pneumonia [J18.9] Other hypervolemia [E87.79] Sepsis, due to unspecified organism, unspecified whether acute organ dysfunction present ([place]) [A41.9]  [facility] TTS 60kg  #. ESRD: with volume overload/acute exacerbation of congestive heart failure/aortic stenosis.  Evaluate daily for dialysis need.  Ultrafiltration limited by aortic stenosis.  - dialysis for tomorrow  #. Anemia of CKD:  hemoglobin 6.7 - Recommend PRBC blood transfusion - EPO with HD treatment  #. SHPTH - holding binders    # Hypertension: home regimen of losartan, torsemide, carvedilol, hydralazine.    LOS: 2 Deaja Rizo 11/13/202010:35 AM

## 2019-10-03 NOTE — Progress Notes (Signed)
Pt to get 1 unit prbcs. Lab called and found  A positive antibody on pt  And it would take longer  For the blood to be ready.  Prn earlier for c/o pain with ease

## 2019-10-03 NOTE — Progress Notes (Signed)
Daily Progress Note   [name]       Date: 10/03/2019 DOB: [birth year]  Age: 73 y.o. MRN#: [id number] Attending Physician: Jennye Boroughs, MD Primary Care Physician: [name], MD Admit Date: 10/01/2019  Reason for Consultation/Follow-up: Establishing goals of care  Subjective: Patient is resting in bed. She states she has talked with cardiology and is happy for referral to TAVR clinic, and is hopeful this would help her feel better and help her toleration of dialysis. Chaplain made aware of patient request for her friend to be her HPOA and will assist with this.   Recommend palliative at D/C.    Length of Stay: 2  Current Medications: Scheduled Meds:  . amLODipine  10 mg Oral Daily  . aspirin  81 mg Oral Daily  . carvedilol  12.5 mg Oral BID WC  . chlorhexidine  15 mL Mouth Rinse BID  . Chlorhexidine Gluconate Cloth  6 each Topical Daily  . clopidogrel  75 mg Oral Daily  . heparin  5,000 Units Subcutaneous Q8H  . hydrALAZINE  50 mg Oral Q8H  . insulin aspart  0-9 Units Subcutaneous TID AC & HS  . loratadine  10 mg Oral Daily  . losartan  50 mg Oral Daily  . mouth rinse  15 mL Mouth Rinse q12n4p  . mometasone-formoterol  2 puff Inhalation BID  . oxybutynin  15 mg Oral Daily  . pantoprazole  40 mg Oral Daily  . pravastatin  40 mg Oral QHS  . scopolamine  1 patch Transdermal Q72H  . sodium chloride flush  3 mL Intravenous Q12H  . torsemide  40 mg Oral Q M,W,F,Su-1800    Continuous Infusions: . sodium chloride      PRN Meds: sodium chloride, acetaminophen **OR** acetaminophen, albuterol, HYDROcodone-acetaminophen, ondansetron **OR** ondansetron (ZOFRAN) IV, senna, topiramate  Physical Exam Pulmonary:     Effort: Pulmonary effort is normal.   Neurological:     Mental Status: She is alert.             Vital Signs: BP (!) 116/43   Pulse 84   Temp 98 F (36.7 C) (Oral)   Resp 20   Ht 5\' 2"  (1.575 m)   Wt 62.3 kg   SpO2 100%   BMI 25.12 kg/m  SpO2: SpO2: 100 %  O2 Device: O2 Device: Nasal Cannula O2 Flow Rate: O2 Flow Rate (L/min): 1 L/min  Intake/output summary:   Intake/Output Summary (Last 24 hours) at 10/03/2019 1139 Last data filed at 10/03/2019 1025 Gross per 24 hour  Intake 360 ml  Output 2271 ml  Net -1911 ml   LBM: Last BM Date: 10/08/19 Baseline Weight: Weight: 63 kg Most recent weight: Weight: 62.3 kg       Palliative Assessment/Data: 60%      Patient Active Problem List   Diagnosis Date Noted  . Acute on chronic respiratory failure with hypoxia ([place]) 10/01/2019  . Aortic valve stenosis   . Acute respiratory failure with hypoxemia ([place]) 09/20/2019  . COPD with acute bronchitis ([place]) 09/05/2019  . Congestive heart failure ([place]) 05/24/2019  . Hyperkalemia 11/29/2018  . Chronic diastolic heart failure ([place]) 11/02/2018  . HTN (hypertension) 11/02/2018  . Uncontrolled hypertension 10/21/2018  . Pressure injury of skin 03/16/2018  . CVA (cerebral vascular accident) ([place]) 01/16/2018  . Aortic atherosclerosis ([place]) 12/11/2017  . Chest pain 09/18/2017  . Hyperlipidemia 08/06/2015  . Complication from renal dialysis device 04/12/2015  . SOB (shortness of breath) 02/01/2015  . End stage renal disease on dialysis ([place]) 02/01/2015  . Type 2 diabetes mellitus with other specified complication ([place]) [id number]  . Asthma 02/01/2015  . Acute on chronic diastolic CHF (congestive heart failure) ([place]) 02/01/2015    Palliative Care Assessment & Plan    Recommendations/Plan:  Recommend palliative at D/C.     Code Status:    Code Status Orders  (From admission, onward)         Start     Ordered   10/01/19 1905  Full code  Continuous     10/01/19 1906        Code Status History    Date  Active Date Inactive Code Status Order ID Comments User Context   09/24/2019 1636 09/30/2019 1839 Full Code [id number]  Ottie Glazier, MD Inpatient   09/22/2019 1901 09/24/2019 1636 DNR [id number]  Ottie Glazier, MD Inpatient   09/22/2019 1901 09/22/2019 1901 DNR [id number]  Ottie Glazier, MD Inpatient   09/20/2019 0830 09/22/2019 1900 Full Code [id number]  Max Sane, MD ED   09/05/2019 2317 09/07/2019 1855 Full Code [id number]  Lance Coon, MD Inpatient   05/24/2019 0134 05/26/2019 1628 Full Code [id number]  Mansy, Arvella Merles, MD ED   05/05/2019 0152 05/08/2019 2121 Full Code [id number]  Lance Coon, MD Inpatient   11/29/2018 0554 11/29/2018 2201 Full Code [id number]  Lance Coon, MD ED   10/21/2018 2235 10/23/2018 1643 Full Code [id number]  Lance Coon, MD Inpatient   03/16/2018 0806 03/17/2018 1637 Full Code [id number]  Loletha Grayer, MD ED   01/16/2018 1630 01/18/2018 2158 Full Code [id number]  Dustin Flock, MD Inpatient   01/16/2018 1340 01/16/2018 1630 DNR [id number]  Dustin Flock, MD ED   09/18/2017 1348 09/19/2017 1513 DNR [id number]  Loletha Grayer, MD ED   04/12/2015 1056 04/12/2015 1631 Full Code [id number]  Dew, Erskine Squibb, MD Inpatient   Advance Care Planning Activity       Prognosis:   Poor without intervention for aortic stenosis.    Thank you for allowing the Palliative Medicine Team to assist in the care of this patient.   Total Time 15 min Prolonged Time Billed  no      Greater than 50%  of this time was spent counseling and coordinating care related to the above assessment and plan.  Asencion Gowda, NP  Please  contact Palliative Medicine Team phone at [phone] for questions and concerns.

## 2019-10-04 DIAGNOSIS — J9621 Acute and chronic respiratory failure with hypoxia: Secondary | ICD-10-CM

## 2019-10-04 LAB — RENAL FUNCTION PANEL
Albumin: 3 g/dL — ABNORMAL LOW (ref 3.5–5.0)
Anion gap: 14 (ref 5–15)
BUN: 44 mg/dL — ABNORMAL HIGH (ref 8–23)
CO2: 24 mmol/L (ref 22–32)
Calcium: 7.8 mg/dL — ABNORMAL LOW (ref 8.9–10.3)
Chloride: 95 mmol/L — ABNORMAL LOW (ref 98–111)
Creatinine, Ser: 6.33 mg/dL — ABNORMAL HIGH (ref 0.44–1.00)
GFR calc Af Amer: 7 mL/min — ABNORMAL LOW (ref >60)
GFR calc non Af Amer: 6 mL/min — ABNORMAL LOW (ref >60)
Glucose, Bld: 141 mg/dL — ABNORMAL HIGH (ref 70–99)
Phosphorus: 3.5 mg/dL (ref 2.5–4.6)
Potassium: 4.2 mmol/L (ref 3.5–5.1)
Sodium: 133 mmol/L — ABNORMAL LOW (ref 135–145)

## 2019-10-04 LAB — GLUCOSE, CAPILLARY
Glucose-Capillary: 93 mg/dL (ref 70–99)
Glucose-Capillary: 130 mg/dL — ABNORMAL HIGH (ref 70–99)
Glucose-Capillary: 99 mg/dL (ref 70–99)

## 2019-10-04 LAB — CBC
HCT: 23.5 % — ABNORMAL LOW (ref 36.0–46.0)
Hemoglobin: 8.1 g/dL — ABNORMAL LOW (ref 12.0–15.0)
MCH: 30 pg (ref 26.0–34.0)
MCHC: 34.5 g/dL (ref 30.0–36.0)
MCV: 87 fL (ref 80.0–100.0)
Platelets: 163 10*3/uL (ref 150–400)
RBC: 2.7 MIL/uL — ABNORMAL LOW (ref 3.87–5.11)
RDW: 15 % (ref 11.5–15.5)
WBC: 5.5 10*3/uL (ref 4.0–10.5)
nRBC: 0 % (ref 0.0–0.2)

## 2019-10-04 LAB — SARS CORONAVIRUS 2 (TAT 6-24 HRS): SARS Coronavirus 2: NEGATIVE

## 2019-10-04 LAB — PREPARE RBC (CROSSMATCH)

## 2019-10-04 MED ORDER — EPOETIN ALFA 10000 UNIT/ML IJ SOLN
10000.0000 [IU] | INTRAMUSCULAR | Status: DC
  Administered 2019-10-04 – 2019-10-07 (×2): 10000 [IU] via INTRAVENOUS
  Filled 2019-10-04 (×2): qty 1

## 2019-10-04 NOTE — Progress Notes (Signed)
Pre HD Note   10/04/19 1015  Hand-Off documentation  Report given to (Full Name) Newt Minion RN   Report received from (Full Name) Jaynie Crumble RN   Vital Signs  Temp 98.7 F (37.1 C)  Temp Source Oral  Pulse Rate 68  Pulse Rate Source Monitor  Resp 17  BP (!) 118/43  BP Location Right Arm  BP Method Automatic  Patient Position (if appropriate) Lying  Oxygen Therapy  SpO2 99 %  O2 Device Nasal Cannula  O2 Flow Rate (L/min) 3 L/min  Pain Assessment  Pain Scale 0-10  Pain Score 0  Time-Out for Hemodialysis  What Procedure? HD  Pt Identifiers(min of two) First/Last Name;MRN/Account#  Correct Site? Yes  Correct Side? Yes  Correct Procedure? Yes  Consents Verified? Yes  Safety Precautions Reviewed? Yes  Engineer, civil (consulting) Number 6  Station Number 3  UF/Alarm Test Passed  Conductivity: Meter 13.6  Conductivity: Machine  13.9  pH 7.2  Reverse Osmosis Main  Normal Saline Lot Number [id number]  Dialyzer Lot Number [id number]  Disposable Set Lot Number [id number]  Machine Temperature 98.6 F (37 C)  Musician and Audible Yes  Blood Lines Intact and Secured Yes  Pre Treatment Patient Checks  Vascular access used during treatment Fistula  HD catheter dressing before treatment WDL  Patient is receiving dialysis in a chair  (In Befd)  Hepatitis B Surface Antigen Results Negative  Date Hepatitis B Surface Antigen Drawn 09/06/19  Hepatitis B Surface Antibody  (>10)  Date Hepatitis B Surface Antibody Drawn 09/25/19  Hemodialysis Consent Verified Yes  Hemodialysis Standing Orders Initiated Yes  ECG (Telemetry) Monitor On Yes  Prime Ordered Normal Saline  Length of  DialysisTreatment -hour(s) 3 Hour(s)  Dialysis Treatment Comments  (Na140)  Dialyzer Elisio 17H NR  Dialysate 2K;2.5 Ca  Dialysis Anticoagulant None  Dialysate Flow Ordered 600  Blood Flow Rate Ordered 400 mL/min  Ultrafiltration Goal 2.5 Liters  Pre Treatment Labs Renal panel;CBC  Dialysis Blood  Pressure Support Ordered Albumin  Education / Care Plan  Dialysis Education Provided Yes  Documented Education in Care Plan Yes  Fistula / Graft Left Forearm Arteriovenous fistula  No Placement Date or Time found.   Placed prior to admission: Yes  Orientation: Left  Access Location: Forearm  Access Type: Arteriovenous fistula  Site Condition No complications  Fistula / Graft Assessment Present;Thrill;Bruit  Status Accessed  Needle Size 15  Drainage Description None

## 2019-10-04 NOTE — TOC Progression Note (Addendum)
Transition of Care [facility]) - Progression Note    Patient Details  Name: Phyllis [name] MRN: [id number] Date of Birth: 1945/12/11  [facility] CM/SW Contact  Katrina Stack, RN Phone Number: 10/04/2019, 3:56 PM  Clinical Narrative:   Patient accepted bed offer from [facility]  Sent acceptance via [place] and left voicemail message for Hilda Blades. Informed attending of need for repeat covid testing prior to discharge to skilled nursing facility    Expected Discharge Plan: [place] Barriers to Discharge: No Barriers Identified  Expected Discharge Plan and Services Expected Discharge Plan: [place] In-house Referral: Hospice / [place] Acute Care Choice: [place] arrangements for the past 2 months: [place], [place]                                       Social Determinants of Health (SDOH) Interventions    Readmission Risk Interventions Readmission Risk Prevention Plan 09/26/2019 05/07/2019  Transportation Screening Complete Complete  Medication Review Press photographer) Complete Complete  PCP or Specialist appointment within 3-5 days of discharge Complete Complete  HRI or Home Care Consult Complete Complete  SW Recovery Care/Counseling Consult Complete -  Palliative Care Screening Not Applicable Not [place] Not Applicable Not Applicable  Some recent data might be hidden

## 2019-10-04 NOTE — Progress Notes (Signed)
Post HD assessment   10/04/19 1340  Neurological  Level of Consciousness Alert  Orientation Level Oriented X4  Respiratory  Respiratory Pattern Regular;Unlabored  Chest Assessment Chest expansion symmetrical  Bilateral Breath Sounds Diminished;Clear  Cardiac  Pulse Regular  Heart Sounds S1, S2;Murmur  Jugular Venous Distention (JVD) No  ECG Monitor Yes  Cardiac Rhythm NSR  Antiarrhythmic device No  Vascular  R Radial Pulse +2  L Radial Pulse +2  Edema Right lower extremity;Left lower extremity  RLE Edema Non-pitting  LLE Edema Non-Pitting  Integumentary  Integumentary (WDL) X  Skin Color Appropriate for ethnicity  Skin Integrity Ecchymosis  Ecchymosis Location Arm  Ecchymosis Location Orientation Bilateral  Musculoskeletal  Musculoskeletal (WDL) X  Generalized Weakness Yes  Gastrointestinal  Bowel Sounds Assessment Active  Psychosocial  Psychosocial (WDL) WDL  Patient Behaviors Cooperative;Calm  Emotional support given Given to patient

## 2019-10-04 NOTE — Progress Notes (Signed)
    Patient to get 1 unit pRBC today. Currently in HD. No new recommendations from a cardiac perspective at this time. Please see progress note from 11/13. Plan for outpatient follow up with structural heart team on 11/23.

## 2019-10-04 NOTE — Plan of Care (Signed)

## 2019-10-04 NOTE — Progress Notes (Signed)
HD Tx Start   Pt arrived to [place] to receive Routine HD Tx. BP WDL to start Tx. Pt requested 3L O2 during Tx for comfort. 2K2.5Ca for 3 hrs. CBC and Renal Blood work was sent to lab.  Will keep monitoring    10/04/19 1030  Vital Signs  Pulse Rate 69  Pulse Rate Source Monitor  Resp 18  BP (!) 122/45  BP Location Right Arm  BP Method Automatic  Patient Position (if appropriate) Lying  Oxygen Therapy  SpO2 99 %  O2 Device Nasal Cannula  O2 Flow Rate (L/min) 3 L/min  Pain Assessment  Pain Scale 0-10  Pain Score 0  During Hemodialysis Assessment  Blood Flow Rate (mL/min) 400 mL/min  Arterial Pressure (mmHg) -120 mmHg  Venous Pressure (mmHg) 90 mmHg  Transmembrane Pressure (mmHg) 80 mmHg  Ultrafiltration Rate (mL/min) 1000 mL/min  Dialysate Flow Rate (mL/min) 600 ml/min  Conductivity: Machine  13.8  HD Safety Checks Performed Yes  Dialysis Fluid Bolus Normal Saline  Bolus Amount (mL) 250 mL  Intra-Hemodialysis Comments Tx initiated

## 2019-10-04 NOTE — Progress Notes (Signed)
[facility]  ROUNDING NOTE   Subjective:   Seen and examined on hemodialysis treatment. Tolerating treatment well.   Hemoglobin 6.7 - PRBC transfusion yesterday.     HEMODIALYSIS FLOWSHEET:  Blood Flow Rate (mL/min): 350 mL/min Arterial Pressure (mmHg): -230 mmHg Venous Pressure (mmHg): 160 mmHg Transmembrane Pressure (mmHg): 60 mmHg Ultrafiltration Rate (mL/min): 0 mL/min Dialysate Flow Rate (mL/min): 600 ml/min Conductivity: Machine : 14 Conductivity: Machine : 14 Dialysis Fluid Bolus: Normal Saline Bolus Amount (mL): 250 mL    Objective:  Vital signs in last 24 hours:  Temp:  [97.6 F (36.4 C)-99 F (37.2 C)] 97.6 F (36.4 C) (11/14 0940) Pulse Rate:  [66-82] 69 (11/14 0940) Resp:  [17-20] 17 (11/14 1115) BP: (118-136)/(42-97) 127/44 (11/14 1115) SpO2:  [97 %-98 %] 97 % (11/14 0940) Weight:  [61.4 kg] 61.4 kg (11/14 0500)  Weight change: -0.9 kg Filed Weights   10/01/19 1647 10/02/19 1000 10/04/19 0500  Weight: 63 kg 62.3 kg 61.4 kg    Intake/Output: I/O last 3 completed shifts: In: 170 [P.O.:170] Out: -    Intake/Output this shift:  Total I/O In: 500 [P.O.:120; Blood:380] Out: -   Physical Exam: General: NAD,  laying in bed  Head: [place]/AT  Eyes: PERRL  Neck: Supple, trachea midline  Lungs:   Coarse breath sounds bilaterally, 3L [place] O2  Heart: Regular rate and rhythm, +murmur  Abdomen:  Soft, nontender, obese  Extremities: trace peripheral edema.  Neurologic: Nonfocal, moving all four extremities  Skin: No lesions  Access: Left AVF    Basic Metabolic Panel: Recent Labs  Lab 09/27/19 1919  09/28/19 0504 09/28/19 1807 09/29/19 0830 09/30/19 0839 10/01/19 1650 10/02/19 0551 10/03/19 0639  NA  --    < > 139  --  136 134* 136 136 138  K  --    < > 4.1  --  4.3 4.8 4.7 5.1 4.3  CL  --    < > 99  --  97* 95* 95* 97* 99  CO2  --    < > 31  --  27 27 28 26 24   GLUCOSE  --    < > 99  --  110* 103* 167* 153* 83  BUN  --    < > 16   --  26* 38* 31* 40* 30*  CREATININE  --    < > 2.62*  --  4.49* 5.91* 4.63* 5.58* 4.44*  CALCIUM  --    < > 8.2*  --  8.6* 8.6* 8.8* 8.7* 8.2*  MG  --   --  2.1  --   --   --   --  2.3  --   PHOS 1.2*  --   --  3.1  --  5.4*  --  5.0* 3.3   < > = values in this interval not displayed.    Liver Function Tests: Recent Labs  Lab 09/30/19 0839 10/01/19 1650 10/02/19 0551 10/03/19 0639  AST  --  14*  --   --   ALT  --  9  --   --   ALKPHOS  --  59  --   --   BILITOT  --  0.7  --   --   PROT  --  6.4*  --   --   ALBUMIN 3.6 3.6 3.4* 3.3*   Recent Labs  Lab 10/01/19 1650  LIPASE 22   No results for input(s): AMMONIA in the last 168 hours.  CBC:  Recent Labs  Lab 09/30/19 0839 10/01/19 1650 10/02/19 0551 10/03/19 0639  WBC 4.0 9.4 6.1 7.4  NEUTROABS  --  6.8  --   --   HGB 8.3* 9.7* 7.8* 6.7*  HCT 24.9* 30.0* 24.0* 20.5*  MCV 88.9 91.2 89.2 89.9  PLT 170 300 162 143*    Cardiac Enzymes: No results for input(s): CKTOTAL, CKMB, CKMBINDEX, TROPONINI in the last 168 hours.  BNP: Invalid input(s): POCBNP  CBG: Recent Labs  Lab 10/03/19 0747 10/03/19 1138 10/03/19 1709 10/03/19 2123 10/04/19 0753  GLUCAP 82 99 102* 97 93    Microbiology: Results for orders placed or performed during the hospital encounter of 10/01/19  SARS CORONAVIRUS 2 (TAT 6-24 HRS) Nasopharyngeal Nasopharyngeal Swab     Status: None   Collection Time: 10/01/19  4:50 PM   Specimen: Nasopharyngeal Swab  Result Value Ref Range Status   SARS Coronavirus 2 NEGATIVE NEGATIVE Final    Comment: (NOTE) SARS-CoV-2 target nucleic acids are NOT DETECTED. The SARS-CoV-2 RNA is generally detectable in upper and lower respiratory specimens during the acute phase of infection. Negative results do not preclude SARS-CoV-2 infection, do not rule out co-infections with other pathogens, and should not be used as the sole basis for treatment or other patient management decisions. Negative results must be  combined with clinical observations, patient history, and epidemiological information. The expected result is Negative. Fact Sheet for Patients: SugarRoll.be Fact Sheet for Healthcare Providers: https://www.woods-mathews.com/ This test is not yet approved or cleared by the [place] FDA and  has been authorized for detection and/or diagnosis of SARS-CoV-2 by FDA under an Emergency Use Authorization (EUA). This EUA will remain  in effect (meaning this test can be used) for the duration of the COVID-19 declaration under Section 56 4(b)(1) of the Act, 21 U.S.C. section 360bbb-3(b)(1), unless the authorization is terminated or revoked sooner. Performed at [facility], [place] [address], [place], [place] [address]   SARS Coronavirus 2 by RT PCR (hospital order, performed in [facility] hospital lab) Nasopharyngeal Nasopharyngeal Swab     Status: None   Collection Time: 10/01/19 11:04 PM   Specimen: Nasopharyngeal Swab  Result Value Ref Range Status   SARS Coronavirus 2 NEGATIVE NEGATIVE Final    Comment: (NOTE) If result is NEGATIVE SARS-CoV-2 target nucleic acids are NOT DETECTED. The SARS-CoV-2 RNA is generally detectable in upper and lower  respiratory specimens during the acute phase of infection. The lowest  concentration of SARS-CoV-2 viral copies this assay can detect is 250  copies / mL. A negative result does not preclude SARS-CoV-2 infection  and should not be used as the sole basis for treatment or other  patient management decisions.  A negative result may occur with  improper specimen collection / handling, submission of specimen other  than nasopharyngeal swab, presence of viral mutation(s) within the  areas targeted by this assay, and inadequate number of viral copies  (<250 copies / mL). A negative result must be combined with clinical  observations, patient history, and epidemiological information. If result is  POSITIVE SARS-CoV-2 target nucleic acids are DETECTED. The SARS-CoV-2 RNA is generally detectable in upper and lower  respiratory specimens dur ing the acute phase of infection.  Positive  results are indicative of active infection with SARS-CoV-2.  Clinical  correlation with patient history and other diagnostic information is  necessary to determine patient infection status.  Positive results do  not rule out bacterial infection or co-infection with other viruses. If  result is PRESUMPTIVE POSTIVE SARS-CoV-2 nucleic acids MAY BE PRESENT.   A presumptive positive result was obtained on the submitted specimen  and confirmed on repeat testing.  While 2019 novel coronavirus  (SARS-CoV-2) nucleic acids may be present in the submitted sample  additional confirmatory testing may be necessary for epidemiological  and / or clinical management purposes  to differentiate between  SARS-CoV-2 and other Sarbecovirus currently known to infect humans.  If clinically indicated additional testing with an alternate test  methodology [phone]) is advised. The SARS-CoV-2 RNA is generally  detectable in upper and lower respiratory sp ecimens during the acute  phase of infection. The expected result is Negative. Fact Sheet for Patients:  StrictlyIdeas.no Fact Sheet for Healthcare Providers: BankingDealers.co.za This test is not yet approved or cleared by the [place] FDA and has been authorized for detection and/or diagnosis of SARS-CoV-2 by FDA under an Emergency Use Authorization (EUA).  This EUA will remain in effect (meaning this test can be used) for the duration of the COVID-19 declaration under Section 564(b)(1) of the Act, 21 U.S.C. section 360bbb-3(b)(1), unless the authorization is terminated or revoked sooner. Performed at [facility], [address] [place], [place] [address]     Coagulation Studies: No results for input(s):  LABPROT, INR in the last 72 hours.  Urinalysis: No results for input(s): COLORURINE, LABSPEC, PHURINE, GLUCOSEU, HGBUR, BILIRUBINUR, KETONESUR, PROTEINUR, UROBILINOGEN, NITRITE, LEUKOCYTESUR in the last 72 hours.  Invalid input(s): APPERANCEUR    Imaging: No results found.   Medications:   . sodium chloride     . amLODipine  10 mg Oral Daily  . aspirin  81 mg Oral Daily  . carvedilol  12.5 mg Oral BID WC  . chlorhexidine  15 mL Mouth Rinse BID  . Chlorhexidine Gluconate Cloth  6 each Topical Daily  . clopidogrel  75 mg Oral Daily  . epoetin (EPOGEN/PROCRIT) injection  10,000 Units Intravenous Q T,Th,Sa-HD  . heparin  5,000 Units Subcutaneous Q8H  . hydrALAZINE  50 mg Oral Q8H  . insulin aspart  0-9 Units Subcutaneous TID AC & HS  . loratadine  10 mg Oral Daily  . losartan  50 mg Oral Daily  . mouth rinse  15 mL Mouth Rinse q12n4p  . mometasone-formoterol  2 puff Inhalation BID  . oxybutynin  15 mg Oral Daily  . pantoprazole  40 mg Oral Daily  . pravastatin  40 mg Oral QHS  . scopolamine  1 patch Transdermal Q72H  . sodium chloride flush  3 mL Intravenous Q12H  . torsemide  40 mg Oral Q M,W,F,Su-1800   sodium chloride, acetaminophen **OR** acetaminophen, albuterol, HYDROcodone-acetaminophen, ondansetron **OR** ondansetron (ZOFRAN) IV, senna, topiramate  Assessment/ Plan:  Ms. KERBI DUGUE is a 73 y.o. white female with end stage renal disease on hemodialysis, hypertension, diabetes mellitus type II, overactive bladder, gout, COPD, congestive heart failure, coronary artery disease, aortic atherosclerosis admitted to [facility] on 10/01/2019 for Respiratory distress [R06.03] ESRD (end stage renal disease) ([place]) [N18.6] Healthcare-associated pneumonia [J18.9] Other hypervolemia [E87.79] Sepsis, due to unspecified organism, unspecified whether acute organ dysfunction present ([place]) [A41.9]  [facility] TTS 60kg  #. ESRD: with volume overload/acute  exacerbation of congestive heart failure/aortic stenosis.  Evaluate daily for dialysis need.  Ultrafiltration limited by aortic stenosis.  Seen and examined on hemodialysis treatment. UF goal of 2.5 -3 liters.   #. Anemia of CKD: hemoglobin 6.7. PRBC blood transfusion on 11/13 - EPO with HD treatment  #.  SHPTH - holding binders    # Hypertension: home regimen of losartan, torsemide, carvedilol, hydralazine.    LOS: 3 Mariadejesus Cade 11/14/202011:39 AM

## 2019-10-04 NOTE — Progress Notes (Signed)
Post HD Tx Note    10/04/19 1345  Hand-Off documentation  Report given to (Full Name) Aris Georgia RN   Report received from (Full Name) Newt Minion RN   Vital Signs  Temp 98.3 F (36.8 C)  Temp Source Oral  Pulse Rate 72  Pulse Rate Source Monitor  Resp 17  BP (!) 132/53  BP Location Right Arm  BP Method Automatic  Patient Position (if appropriate) Lying  Oxygen Therapy  SpO2 100 %  O2 Device Nasal Cannula  O2 Flow Rate (L/min) 3 L/min  Pain Assessment  Pain Scale 0-10  Pain Score 0  Post-Hemodialysis Assessment  Rinseback Volume (mL) 250 mL  KECN 69.7 V  Dialyzer Clearance Lightly streaked  Duration of HD Treatment -hour(s) 3 hour(s)  Hemodialysis Intake (mL) 500 mL  UF Total -Machine (mL) 3010 mL  Net UF (mL) 2510 mL  Tolerated HD Treatment Yes  AVG/AVF Arterial Site Held (minutes) 8 minutes  AVG/AVF Venous Site Held (minutes) 10 minutes  Fistula / Graft Left Forearm Arteriovenous fistula  No Placement Date or Time found.   Placed prior to admission: Yes  Orientation: Left  Access Location: Forearm  Access Type: Arteriovenous fistula  Site Condition No complications  Fistula / Graft Assessment Present;Thrill;Bruit  Status Deaccessed  Drainage Description None

## 2019-10-04 NOTE — Progress Notes (Signed)
HD Tx completed   10/04/19 1335  Vital Signs  Pulse Rate 75  Resp 13  BP 122/65  Oxygen Therapy  SpO2 100 %  O2 Device Nasal Cannula  O2 Flow Rate (L/min) 3 L/min  Pain Assessment  Pain Scale 0-10  Pain Score 0  During Hemodialysis Assessment  Blood Flow Rate (mL/min) 400 mL/min  Arterial Pressure (mmHg) -130 mmHg  Venous Pressure (mmHg) 130 mmHg  Transmembrane Pressure (mmHg) 80 mmHg  Ultrafiltration Rate (mL/min) 1000 mL/min  Dialysate Flow Rate (mL/min) 600 ml/min  Conductivity: Machine  14  HD Safety Checks Performed Yes  Intra-Hemodialysis Comments Tx completed

## 2019-10-04 NOTE — Progress Notes (Signed)
Pre HD Tx Assessment   10/04/19 1015  Neurological  Level of Consciousness Alert  Orientation Level Oriented X4  Respiratory  Respiratory Pattern Regular;Unlabored  Chest Assessment Chest expansion symmetrical  Bilateral Breath Sounds Clear;Diminished  Cough Non-productive  Cardiac  Pulse Regular  Heart Sounds S1, S2;Murmur  Jugular Venous Distention (JVD) No  ECG Monitor Yes  Cardiac Rhythm NSR  Antiarrhythmic device No  Vascular  R Radial Pulse +2  L Radial Pulse +2  Edema Right lower extremity;Left lower extremity  RLE Edema Non-pitting  LLE Edema Non-Pitting  Integumentary  Integumentary (WDL) X  Skin Color Appropriate for ethnicity  Skin Integrity Ecchymosis  Ecchymosis Location Arm  Ecchymosis Location Orientation Bilateral  Musculoskeletal  Musculoskeletal (WDL) X  Generalized Weakness Yes  Gastrointestinal  Bowel Sounds Assessment Active  Last BM Date 09/28/19  Psychosocial  Psychosocial (WDL) WDL  Patient Behaviors Cooperative;Calm  Emotional support given Given to patient

## 2019-10-04 NOTE — Progress Notes (Signed)
Progress Note    Phyllis [name]  [id number] DOB: May 28, 1946  DOA: 10/01/2019 PCP: [name], MD      Brief Narrative:    Medical records reviewed and are as summarized below:  Phyllis [name] is an 73 y.o. female with medical history significant for ESRD on hemodialysis (Tuesdays, Thursdays and Saturdays), chronic diastolic CHF, moderate to severe aortic stenosis, COPD, type II DM, hypertension, hyperlipidemia.  She was recently hospitalized from 55 31-11 10 for acute hypoxemic respiratory failure secondary to CHF exacerbation/pulmonary edema that required mechanical ventilation.  She had also received a course of antibiotics for possible pneumonia on that visit.  She was readmitted to the hospital because of increasing shortness of breath and acute hypoxemic respiratory failure.  She was seen in consultation by the nephrologist and she underwent hemodialysis.  She was also seen in consultation by the cardiologist, Dr. Rockey Situ.  Follow-up has been arranged for evaluation for TAVR.      Assessment/Plan:   Principal Problem:   Acute on chronic respiratory failure with hypoxia (HCC) Active Problems:   End stage renal disease on dialysis ([place])   Type 2 diabetes mellitus with other specified complication (HCC)   Acute on chronic diastolic CHF (congestive heart failure) (HCC)   Hyperlipidemia   HTN (hypertension)   Acute on chronic anemia   Body mass index is 24.76 kg/m.    Acute on chronic hypoxemic respiratory failure: Improved.  Taper off oxygen as able.  Acute on chronic anemia/anemia of chronic disease: s/p transfusion with 1 unit of packed red blood cells on 10/03/2019.  ESRD: Follow-up with nephrologist for hemodialysis.  Acute on chronic diastolic CHF/moderate to severe aortic stenosis: Hemodialysis as scheduled for fluid management.  Outpatient follow-up with cardiologist for evaluation for TAVR.   PVD/carotid artery disease: Patient said  she was taking aspirin and Plavix but she was asked to discontinue aspirin on her recent admission.  She said she was "bleeding everywhere" because she was also taking heparin.  Aspirin will therefore be discontinued per patient's request.   Type 2 diabetes mellitus: NovoLog as needed.  Hypertension: Continue antihypertensives    Family Communication/Anticipated D/C date and plan/Code Status   DVT prophylaxis: Heparin Code Status: Full code Family Communication: Plan discussed with the patient Disposition Plan: Skilled nursing facility when bed available      Subjective:   C/o mild neck discomfort from laying on the bed.  No shortness of breath or chest pain.  She tolerated dialysis well today.  She did not have any problems with blood transfusion yesterday.  Objective:    Vitals:   10/04/19 1315 10/04/19 1330 10/04/19 1335 10/04/19 1345  BP: (!) 126/46 122/65 122/65 (!) 132/53  Pulse: 75 75 75 72  Resp: 14 16 13 17   Temp:    98.3 F (36.8 C)  TempSrc:    Oral  SpO2: 100% 100% 100% 100%  Weight:      Height:        Intake/Output Summary (Last 24 hours) at 10/04/2019 1643 Last data filed at 10/04/2019 1345 Gross per 24 hour  Intake 550 ml  Output 2510 ml  Net -1960 ml   Filed Weights   10/01/19 1647 10/02/19 1000 10/04/19 0500  Weight: 63 kg 62.3 kg 61.4 kg    Exam:  GEN: NAD SKIN: No rash EYES: EOMI ENT: MMM CV: RRR PULM: CTA B ABD: soft, ND, NT, +BS CNS: AAO x 3, non focal EXT: No edema or tenderness  Data Reviewed:   I have personally reviewed following labs and imaging studies:  Labs: Labs show the following:   Basic Metabolic Panel: Recent Labs  Lab 09/28/19 0504 09/28/19 1807  09/30/19 0839 10/01/19 1650 10/02/19 0551 10/03/19 0639 10/04/19 1100  NA 139  --    < > 134* 136 136 138 133*  K 4.1  --    < > 4.8 4.7 5.1 4.3 4.2  CL 99  --    < > 95* 95* 97* 99 95*  CO2 31  --    < > 27 28 26 24 24   GLUCOSE 99  --    < > 103* 167*  153* 83 141*  BUN 16  --    < > 38* 31* 40* 30* 44*  CREATININE 2.62*  --    < > 5.91* 4.63* 5.58* 4.44* 6.33*  CALCIUM 8.2*  --    < > 8.6* 8.8* 8.7* 8.2* 7.8*  MG 2.1  --   --   --   --  2.3  --   --   PHOS  --  3.1  --  5.4*  --  5.0* 3.3 3.5   < > = values in this interval not displayed.   GFR Estimated Creatinine Clearance: 6.8 mL/min (A) (by C-G formula based on SCr of 6.33 mg/dL (H)). Liver Function Tests: Recent Labs  Lab 09/30/19 0839 10/01/19 1650 10/02/19 0551 10/03/19 0639 10/04/19 1100  AST  --  14*  --   --   --   ALT  --  9  --   --   --   ALKPHOS  --  59  --   --   --   BILITOT  --  0.7  --   --   --   PROT  --  6.4*  --   --   --   ALBUMIN 3.6 3.6 3.4* 3.3* 3.0*   Recent Labs  Lab 10/01/19 1650  LIPASE 22   No results for input(s): AMMONIA in the last 168 hours. Coagulation profile No results for input(s): INR, PROTIME in the last 168 hours.  CBC: Recent Labs  Lab 09/30/19 0839 10/01/19 1650 10/02/19 0551 10/03/19 0639 10/04/19 1100  WBC 4.0 9.4 6.1 7.4 5.5  NEUTROABS  --  6.8  --   --   --   HGB 8.3* 9.7* 7.8* 6.7* 8.1*  HCT 24.9* 30.0* 24.0* 20.5* 23.5*  MCV 88.9 91.2 89.2 89.9 87.0  PLT 170 300 162 143* 163   Cardiac Enzymes: No results for input(s): CKTOTAL, CKMB, CKMBINDEX, TROPONINI in the last 168 hours. BNP (last 3 results) No results for input(s): PROBNP in the last 8760 hours. CBG: Recent Labs  Lab 10/03/19 0747 10/03/19 1138 10/03/19 1709 10/03/19 2123 10/04/19 0753  GLUCAP 82 99 102* 97 93   D-Dimer: No results for input(s): DDIMER in the last 72 hours. Hgb A1c: No results for input(s): HGBA1C in the last 72 hours. Lipid Profile: No results for input(s): CHOL, HDL, LDLCALC, TRIG, CHOLHDL, LDLDIRECT in the last 72 hours. Thyroid function studies: No results for input(s): TSH, T4TOTAL, T3FREE, THYROIDAB in the last 72 hours.  Invalid input(s): FREET3 Anemia work up: No results for input(s): VITAMINB12, FOLATE,  FERRITIN, TIBC, IRON, RETICCTPCT in the last 72 hours. Sepsis Labs: Recent Labs  Lab 10/01/19 1650 10/01/19 1836 10/01/19 1923 10/02/19 0551 10/03/19 0639 10/04/19 1100  PROCALCITON 0.92  --   --  1.23 3.25  --  WBC 9.4  --   --  6.1 7.4 5.5  LATICACIDVEN  --  0.7 0.7  --   --   --     Microbiology Recent Results (from the past 240 hour(s))  SARS CORONAVIRUS 2 (TAT 6-24 HRS) Nasopharyngeal Nasopharyngeal Swab     Status: None   Collection Time: 10/01/19  4:50 PM   Specimen: Nasopharyngeal Swab  Result Value Ref Range Status   SARS Coronavirus 2 NEGATIVE NEGATIVE Final    Comment: (NOTE) SARS-CoV-2 target nucleic acids are NOT DETECTED. The SARS-CoV-2 RNA is generally detectable in upper and lower respiratory specimens during the acute phase of infection. Negative results do not preclude SARS-CoV-2 infection, do not rule out co-infections with other pathogens, and should not be used as the sole basis for treatment or other patient management decisions. Negative results must be combined with clinical observations, patient history, and epidemiological information. The expected result is Negative. Fact Sheet for Patients: SugarRoll.be Fact Sheet for Healthcare Providers: https://www.woods-mathews.com/ This test is not yet approved or cleared by the [place] FDA and  has been authorized for detection and/or diagnosis of SARS-CoV-2 by FDA under an Emergency Use Authorization (EUA). This EUA will remain  in effect (meaning this test can be used) for the duration of the COVID-19 declaration under Section 56 4(b)(1) of the Act, 21 U.S.C. section 360bbb-3(b)(1), unless the authorization is terminated or revoked sooner. Performed at [facility], [place] [address], [place], [place] [address]   SARS Coronavirus 2 by RT PCR (hospital order, performed in [facility] hospital lab) Nasopharyngeal Nasopharyngeal Swab     Status: None    Collection Time: 10/01/19 11:04 PM   Specimen: Nasopharyngeal Swab  Result Value Ref Range Status   SARS Coronavirus 2 NEGATIVE NEGATIVE Final    Comment: (NOTE) If result is NEGATIVE SARS-CoV-2 target nucleic acids are NOT DETECTED. The SARS-CoV-2 RNA is generally detectable in upper and lower  respiratory specimens during the acute phase of infection. The lowest  concentration of SARS-CoV-2 viral copies this assay can detect is 250  copies / mL. A negative result does not preclude SARS-CoV-2 infection  and should not be used as the sole basis for treatment or other  patient management decisions.  A negative result may occur with  improper specimen collection / handling, submission of specimen other  than nasopharyngeal swab, presence of viral mutation(s) within the  areas targeted by this assay, and inadequate number of viral copies  (<250 copies / mL). A negative result must be combined with clinical  observations, patient history, and epidemiological information. If result is POSITIVE SARS-CoV-2 target nucleic acids are DETECTED. The SARS-CoV-2 RNA is generally detectable in upper and lower  respiratory specimens dur ing the acute phase of infection.  Positive  results are indicative of active infection with SARS-CoV-2.  Clinical  correlation with patient history and other diagnostic information is  necessary to determine patient infection status.  Positive results do  not rule out bacterial infection or co-infection with other viruses. If result is PRESUMPTIVE POSTIVE SARS-CoV-2 nucleic acids MAY BE PRESENT.   A presumptive positive result was obtained on the submitted specimen  and confirmed on repeat testing.  While 2019 novel coronavirus  (SARS-CoV-2) nucleic acids may be present in the submitted sample  additional confirmatory testing may be necessary for epidemiological  and / or clinical management purposes  to differentiate between  SARS-CoV-2 and other Sarbecovirus  currently known to infect humans.  If clinically indicated  additional testing with an alternate test  methodology [phone]) is advised. The SARS-CoV-2 RNA is generally  detectable in upper and lower respiratory sp ecimens during the acute  phase of infection. The expected result is Negative. Fact Sheet for Patients:  StrictlyIdeas.no Fact Sheet for Healthcare Providers: BankingDealers.co.za This test is not yet approved or cleared by the [place] FDA and has been authorized for detection and/or diagnosis of SARS-CoV-2 by FDA under an Emergency Use Authorization (EUA).  This EUA will remain in effect (meaning this test can be used) for the duration of the COVID-19 declaration under Section 564(b)(1) of the Act, 21 U.S.C. section 360bbb-3(b)(1), unless the authorization is terminated or revoked sooner. Performed at [facility], [address] [place], [place] [address]     Procedures and diagnostic studies:  No results found.  Medications:   . amLODipine  10 mg Oral Daily  . carvedilol  12.5 mg Oral BID WC  . chlorhexidine  15 mL Mouth Rinse BID  . Chlorhexidine Gluconate Cloth  6 each Topical Daily  . clopidogrel  75 mg Oral Daily  . epoetin (EPOGEN/PROCRIT) injection  10,000 Units Intravenous Q T,Th,Sa-HD  . heparin  5,000 Units Subcutaneous Q8H  . hydrALAZINE  50 mg Oral Q8H  . insulin aspart  0-9 Units Subcutaneous TID AC & HS  . loratadine  10 mg Oral Daily  . losartan  50 mg Oral Daily  . mouth rinse  15 mL Mouth Rinse q12n4p  . mometasone-formoterol  2 puff Inhalation BID  . oxybutynin  15 mg Oral Daily  . pantoprazole  40 mg Oral Daily  . pravastatin  40 mg Oral QHS  . scopolamine  1 patch Transdermal Q72H  . sodium chloride flush  3 mL Intravenous Q12H  . torsemide  40 mg Oral Q M,W,F,Su-1800   Continuous Infusions: . sodium chloride       LOS: 3 days   Graceson Nichelson  [facility] Pager  [phone].   *Please refer to [email or web address], password [email or web address] to get updated schedule on who will round on this patient, as hospitalists switch teams weekly. If 7PM-7AM, please contact night-coverage at [email or web address], password [email or web address] for any overnight needs.  10/04/2019, 4:43 PM

## 2019-10-05 LAB — TYPE AND SCREEN
ABO/RH(D): O POS
Antibody Screen: POSITIVE
Donor AG Type: NEGATIVE
Unit division: 0
Unit division: 0
Unit division: 0

## 2019-10-05 LAB — GLUCOSE, CAPILLARY
Glucose-Capillary: 108 mg/dL — ABNORMAL HIGH (ref 70–99)
Glucose-Capillary: 108 mg/dL — ABNORMAL HIGH (ref 70–99)
Glucose-Capillary: 109 mg/dL — ABNORMAL HIGH (ref 70–99)
Glucose-Capillary: 118 mg/dL — ABNORMAL HIGH (ref 70–99)
Glucose-Capillary: 90 mg/dL (ref 70–99)

## 2019-10-05 LAB — BPAM RBC
Blood Product Expiration Date: 202012072359
Blood Product Expiration Date: 202012152359
Blood Product Expiration Date: 202012192359
ISSUE DATE / TIME: 202011140507
Unit Type and Rh: 5100
Unit Type and Rh: 5100
Unit Type and Rh: 5100

## 2019-10-05 MED ORDER — HYDROCODONE-ACETAMINOPHEN 7.5-325 MG PO TABS
1.0000 | ORAL_TABLET | Freq: Two times a day (BID) | ORAL | Status: DC | PRN
  Administered 2019-10-06 – 2019-10-07 (×2): 1 via ORAL
  Administered 2019-10-08: 2 via ORAL
  Filled 2019-10-05: qty 1
  Filled 2019-10-05: qty 2
  Filled 2019-10-05: qty 1

## 2019-10-05 NOTE — Progress Notes (Signed)
[facility]  ROUNDING NOTE   Subjective:   Hemodialysis treatment yesterday. Tolerated treatment well. UF of 2.5 liters.  Patient states her chest pain is well controlled this morning.   Objective:  Vital signs in last 24 hours:  Temp:  [97.5 F (36.4 C)-98.9 F (37.2 C)] 97.5 F (36.4 C) (11/15 0850) Pulse Rate:  [62-76] 72 (11/15 0850) Resp:  [13-17] 15 (11/15 0850) BP: (122-145)/(32-65) 122/50 (11/15 0850) SpO2:  [96 %-100 %] 98 % (11/15 0850) Weight:  [60.3 kg] 60.3 kg (11/15 0500)  Weight change: -1.1 kg Filed Weights   10/02/19 1000 10/04/19 0500 10/05/19 0500  Weight: 62.3 kg 61.4 kg 60.3 kg    Intake/Output: I/O last 3 completed shifts: In: 670 [P.O.:290; Blood:380] Out: 2710 [Urine:200; Other:2510]   Intake/Output this shift:  Total I/O In: 360 [P.O.:360] Out: -   Physical Exam: General: NAD, sitting in chair  Head: [place]/AT  Eyes: PERRL  Neck: Supple, trachea midline  Lungs:   clear   Heart: Regular rate and rhythm, +murmur  Abdomen:  Soft, nontender, obese  Extremities: No peripheral edema.  Neurologic: Nonfocal, moving all four extremities  Skin: No lesions  Access: Left AVF    Basic Metabolic Panel: Recent Labs  Lab 09/28/19 1807  09/30/19 0839 10/01/19 1650 10/02/19 0551 10/03/19 0639 10/04/19 1100  NA  --    < > 134* 136 136 138 133*  K  --    < > 4.8 4.7 5.1 4.3 4.2  CL  --    < > 95* 95* 97* 99 95*  CO2  --    < > 27 28 26 24 24   GLUCOSE  --    < > 103* 167* 153* 83 141*  BUN  --    < > 38* 31* 40* 30* 44*  CREATININE  --    < > 5.91* 4.63* 5.58* 4.44* 6.33*  CALCIUM  --    < > 8.6* 8.8* 8.7* 8.2* 7.8*  MG  --   --   --   --  2.3  --   --   PHOS 3.1  --  5.4*  --  5.0* 3.3 3.5   < > = values in this interval not displayed.    Liver Function Tests: Recent Labs  Lab 09/30/19 0839 10/01/19 1650 10/02/19 0551 10/03/19 0639 10/04/19 1100  AST  --  14*  --   --   --   ALT  --  9  --   --   --   ALKPHOS  --  59   --   --   --   BILITOT  --  0.7  --   --   --   PROT  --  6.4*  --   --   --   ALBUMIN 3.6 3.6 3.4* 3.3* 3.0*   Recent Labs  Lab 10/01/19 1650  LIPASE 22   No results for input(s): AMMONIA in the last 168 hours.  CBC: Recent Labs  Lab 09/30/19 0839 10/01/19 1650 10/02/19 0551 10/03/19 0639 10/04/19 1100  WBC 4.0 9.4 6.1 7.4 5.5  NEUTROABS  --  6.8  --   --   --   HGB 8.3* 9.7* 7.8* 6.7* 8.1*  HCT 24.9* 30.0* 24.0* 20.5* 23.5*  MCV 88.9 91.2 89.2 89.9 87.0  PLT 170 300 162 143* 163    Cardiac Enzymes: No results for input(s): CKTOTAL, CKMB, CKMBINDEX, TROPONINI in the last 168 hours.  BNP: Invalid input(s): POCBNP  CBG: Recent Labs  Lab 10/04/19 1715 10/04/19 2020 10/05/19 0013 10/05/19 0745 10/05/19 1131  GLUCAP 130* 99 118* 90 109*    Microbiology: Results for orders placed or performed during the hospital encounter of 10/01/19  SARS CORONAVIRUS 2 (TAT 6-24 HRS) Nasopharyngeal Nasopharyngeal Swab     Status: None   Collection Time: 10/01/19  4:50 PM   Specimen: Nasopharyngeal Swab  Result Value Ref Range Status   SARS Coronavirus 2 NEGATIVE NEGATIVE Final    Comment: (NOTE) SARS-CoV-2 target nucleic acids are NOT DETECTED. The SARS-CoV-2 RNA is generally detectable in upper and lower respiratory specimens during the acute phase of infection. Negative results do not preclude SARS-CoV-2 infection, do not rule out co-infections with other pathogens, and should not be used as the sole basis for treatment or other patient management decisions. Negative results must be combined with clinical observations, patient history, and epidemiological information. The expected result is Negative. Fact Sheet for Patients: SugarRoll.be Fact Sheet for Healthcare Providers: https://www.woods-mathews.com/ This test is not yet approved or cleared by the [place] FDA and  has been authorized for detection and/or diagnosis of  SARS-CoV-2 by FDA under an Emergency Use Authorization (EUA). This EUA will remain  in effect (meaning this test can be used) for the duration of the COVID-19 declaration under Section 56 4(b)(1) of the Act, 21 U.S.C. section 360bbb-3(b)(1), unless the authorization is terminated or revoked sooner. Performed at [facility], [place] [address], [place], [place] [address]   SARS Coronavirus 2 by RT PCR (hospital order, performed in [facility] hospital lab) Nasopharyngeal Nasopharyngeal Swab     Status: None   Collection Time: 10/01/19 11:04 PM   Specimen: Nasopharyngeal Swab  Result Value Ref Range Status   SARS Coronavirus 2 NEGATIVE NEGATIVE Final    Comment: (NOTE) If result is NEGATIVE SARS-CoV-2 target nucleic acids are NOT DETECTED. The SARS-CoV-2 RNA is generally detectable in upper and lower  respiratory specimens during the acute phase of infection. The lowest  concentration of SARS-CoV-2 viral copies this assay can detect is 250  copies / mL. A negative result does not preclude SARS-CoV-2 infection  and should not be used as the sole basis for treatment or other  patient management decisions.  A negative result may occur with  improper specimen collection / handling, submission of specimen other  than nasopharyngeal swab, presence of viral mutation(s) within the  areas targeted by this assay, and inadequate number of viral copies  (<250 copies / mL). A negative result must be combined with clinical  observations, patient history, and epidemiological information. If result is POSITIVE SARS-CoV-2 target nucleic acids are DETECTED. The SARS-CoV-2 RNA is generally detectable in upper and lower  respiratory specimens dur ing the acute phase of infection.  Positive  results are indicative of active infection with SARS-CoV-2.  Clinical  correlation with patient history and other diagnostic information is  necessary to determine patient infection status.  Positive results do   not rule out bacterial infection or co-infection with other viruses. If result is PRESUMPTIVE POSTIVE SARS-CoV-2 nucleic acids MAY BE PRESENT.   A presumptive positive result was obtained on the submitted specimen  and confirmed on repeat testing.  While 2019 novel coronavirus  (SARS-CoV-2) nucleic acids may be present in the submitted sample  additional confirmatory testing may be necessary for epidemiological  and / or clinical management purposes  to differentiate between  SARS-CoV-2 and other Sarbecovirus currently known to infect humans.  If clinically indicated  additional testing with an alternate test  methodology [phone]) is advised. The SARS-CoV-2 RNA is generally  detectable in upper and lower respiratory sp ecimens during the acute  phase of infection. The expected result is Negative. Fact Sheet for Patients:  StrictlyIdeas.no Fact Sheet for Healthcare Providers: BankingDealers.co.za This test is not yet approved or cleared by the [place] FDA and has been authorized for detection and/or diagnosis of SARS-CoV-2 by FDA under an Emergency Use Authorization (EUA).  This EUA will remain in effect (meaning this test can be used) for the duration of the COVID-19 declaration under Section 564(b)(1) of the Act, 21 U.S.C. section 360bbb-3(b)(1), unless the authorization is terminated or revoked sooner. Performed at [facility], [place], [place] [address]   SARS CORONAVIRUS 2 (TAT 6-24 HRS) Nasopharyngeal Nasopharyngeal Swab     Status: None   Collection Time: 10/04/19  5:13 PM   Specimen: Nasopharyngeal Swab  Result Value Ref Range Status   SARS Coronavirus 2 NEGATIVE NEGATIVE Final    Comment: (NOTE) SARS-CoV-2 target nucleic acids are NOT DETECTED. The SARS-CoV-2 RNA is generally detectable in upper and lower respiratory specimens during the acute phase of infection. Negative results do not  preclude SARS-CoV-2 infection, do not rule out co-infections with other pathogens, and should not be used as the sole basis for treatment or other patient management decisions. Negative results must be combined with clinical observations, patient history, and epidemiological information. The expected result is Negative. Fact Sheet for Patients: SugarRoll.be Fact Sheet for Healthcare Providers: https://www.woods-mathews.com/ This test is not yet approved or cleared by the [place] FDA and  has been authorized for detection and/or diagnosis of SARS-CoV-2 by FDA under an Emergency Use Authorization (EUA). This EUA will remain  in effect (meaning this test can be used) for the duration of the COVID-19 declaration under Section 56 4(b)(1) of the Act, 21 U.S.C. section 360bbb-3(b)(1), unless the authorization is terminated or revoked sooner. Performed at [facility], [place] [address], [place], [place] [address]     Coagulation Studies: No results for input(s): LABPROT, INR in the last 72 hours.  Urinalysis: No results for input(s): COLORURINE, LABSPEC, PHURINE, GLUCOSEU, HGBUR, BILIRUBINUR, KETONESUR, PROTEINUR, UROBILINOGEN, NITRITE, LEUKOCYTESUR in the last 72 hours.  Invalid input(s): APPERANCEUR    Imaging: No results found.   Medications:   . sodium chloride     . amLODipine  10 mg Oral Daily  . carvedilol  12.5 mg Oral BID WC  . chlorhexidine  15 mL Mouth Rinse BID  . Chlorhexidine Gluconate Cloth  6 each Topical Daily  . clopidogrel  75 mg Oral Daily  . epoetin (EPOGEN/PROCRIT) injection  10,000 Units Intravenous Q T,Th,Sa-HD  . heparin  5,000 Units Subcutaneous Q8H  . hydrALAZINE  50 mg Oral Q8H  . insulin aspart  0-9 Units Subcutaneous TID AC & HS  . loratadine  10 mg Oral Daily  . losartan  50 mg Oral Daily  . mouth rinse  15 mL Mouth Rinse q12n4p  . mometasone-formoterol  2 puff Inhalation BID  . oxybutynin   15 mg Oral Daily  . pantoprazole  40 mg Oral Daily  . pravastatin  40 mg Oral QHS  . scopolamine  1 patch Transdermal Q72H  . sodium chloride flush  3 mL Intravenous Q12H  . torsemide  40 mg Oral Q M,W,F,Su-1800   sodium chloride, acetaminophen **OR** acetaminophen, albuterol, HYDROcodone-acetaminophen, ondansetron **OR** ondansetron (ZOFRAN) IV, senna, topiramate  Assessment/ Plan:  Ms.  Phyllis [name] is a 73 y.o. white female with end stage renal disease on hemodialysis, hypertension, diabetes mellitus type II, overactive bladder, gout, COPD, congestive heart failure, coronary artery disease, aortic atherosclerosis admitted to [facility] on 10/01/2019 for Respiratory distress [R06.03] ESRD (end stage renal disease) ([place]) [N18.6] Healthcare-associated pneumonia [J18.9] Other hypervolemia [E87.79] Sepsis, due to unspecified organism, unspecified whether acute organ dysfunction present ([place]) [A41.9]  [facility] TTS 60kg  #. ESRD: with volume overload/acute exacerbation of congestive heart failure/aortic stenosis on admission. Emergent dialysis on admission Evaluate daily for dialysis need.  Ultrafiltration limited by aortic stenosis.  - Continue TTS schecdule  #. Anemia of CKD: hemoglobin 8.1. Status post PRBC blood transfusion on 11/13 - EPO with HD treatment  #. SHPTH - holding binders    # Hypertension: home regimen of losartan, torsemide, carvedilol, hydralazine.    LOS: 4 Phyllis [name] 11/15/202012:35 PM

## 2019-10-05 NOTE — Progress Notes (Signed)
Progress Note    Phyllis [name]  [id number] DOB: 1946-10-01  DOA: 10/01/2019 PCP: [name], MD      Brief Narrative:    Medical records reviewed and are as summarized below:  Phyllis [name] is an 73 y.o. female with medical history significant for ESRD on hemodialysis (Tuesdays, Thursdays and Saturdays), chronic diastolic CHF, moderate to severe aortic stenosis, COPD, type II DM, hypertension, hyperlipidemia.  She was recently hospitalized from 48 31-11 10 for acute hypoxemic respiratory failure secondary to CHF exacerbation/pulmonary edema that required mechanical ventilation.  She had also received a course of antibiotics for possible pneumonia on that visit.  She was readmitted to the hospital because of increasing shortness of breath and acute hypoxemic respiratory failure.  She was seen in consultation by the nephrologist and she underwent hemodialysis.  She was also seen in consultation by the cardiologist, Dr. Rockey Situ.  Follow-up has been arranged for evaluation for TAVR.      Assessment/Plan:   Principal Problem:   Acute on chronic respiratory failure with hypoxia (HCC) Active Problems:   End stage renal disease on dialysis ([place])   Type 2 diabetes mellitus with other specified complication (HCC)   Acute on chronic diastolic CHF (congestive heart failure) (HCC)   Hyperlipidemia   HTN (hypertension)   Acute on chronic anemia   Body mass index is 24.31 kg/m.    Acute on chronic hypoxemic respiratory failure: Resolved.  She is tolerating room air.  Acute on chronic anemia/anemia of chronic disease: s/p transfusion with 1 unit of packed red blood cells on 10/03/2019.  ESRD: Follow-up with nephrologist for hemodialysis.  Acute on chronic diastolic CHF/moderate to severe aortic stenosis: Hemodialysis as scheduled for fluid management.  Outpatient follow-up with cardiologist for evaluation for TAVR.   PVD/carotid artery disease: Patient said  she was taking aspirin and Plavix but she was asked to discontinue aspirin on her recent admission.  She said she was "bleeding everywhere" because she was also taking heparin.  Aspirin has been discontinued.  Type 2 diabetes mellitus: NovoLog as needed.  Hypertension: Continue antihypertensives  Chest pain/costochondritis: Analgesics as needed for pain.    Family Communication/Anticipated D/C date and plan/Code Status   DVT prophylaxis: Heparin Code Status: Full code Family Communication: Plan discussed with the patient Disposition Plan: Possible discharge to skilled nursing facility in 1 to 2 days depending on bed availability and insurance authorization.     Subjective:   C/o chest pain from costochondritis.  She said she was diagnosed with costochondritis several years ago she has been using Darvocet for this.  Objective:    Vitals:   10/05/19 0010 10/05/19 0500 10/05/19 0600 10/05/19 0850  BP: (!) 122/32  (!) 135/58 (!) 122/50  Pulse: 73  62 72  Resp: 17   15  Temp: 98.6 F (37 C)   (!) 97.5 F (36.4 C)  TempSrc: Oral   Oral  SpO2: 97%   98%  Weight:  60.3 kg    Height:        Intake/Output Summary (Last 24 hours) at 10/05/2019 1250 Last data filed at 10/05/2019 1132 Gross per 24 hour  Intake 480 ml  Output 2710 ml  Net -2230 ml   Filed Weights   10/02/19 1000 10/04/19 0500 10/05/19 0500  Weight: 62.3 kg 61.4 kg 60.3 kg    Exam:  GEN: NAD SKIN: No rash EYES: EOMI ENT: MMM CV: RRR, systolic murmur loudest in the aortic area PULM: CTA B  ABD: soft, ND, NT, +BS CNS: AAO x 3, non focal EXT: No edema or tenderness MSK: Mid anterior chest wall tenderness      Data Reviewed:   I have personally reviewed following labs and imaging studies:  Labs: Labs show the following:   Basic Metabolic Panel: Recent Labs  Lab 09/28/19 1807  09/30/19 0839 10/01/19 1650 10/02/19 0551 10/03/19 0639 10/04/19 1100  NA  --    < > 134* 136 136 138 133*  K   --    < > 4.8 4.7 5.1 4.3 4.2  CL  --    < > 95* 95* 97* 99 95*  CO2  --    < > 27 28 26 24 24   GLUCOSE  --    < > 103* 167* 153* 83 141*  BUN  --    < > 38* 31* 40* 30* 44*  CREATININE  --    < > 5.91* 4.63* 5.58* 4.44* 6.33*  CALCIUM  --    < > 8.6* 8.8* 8.7* 8.2* 7.8*  MG  --   --   --   --  2.3  --   --   PHOS 3.1  --  5.4*  --  5.0* 3.3 3.5   < > = values in this interval not displayed.   GFR Estimated Creatinine Clearance: 6.8 mL/min (A) (by C-G formula based on SCr of 6.33 mg/dL (H)). Liver Function Tests: Recent Labs  Lab 09/30/19 0839 10/01/19 1650 10/02/19 0551 10/03/19 0639 10/04/19 1100  AST  --  14*  --   --   --   ALT  --  9  --   --   --   ALKPHOS  --  59  --   --   --   BILITOT  --  0.7  --   --   --   PROT  --  6.4*  --   --   --   ALBUMIN 3.6 3.6 3.4* 3.3* 3.0*   Recent Labs  Lab 10/01/19 1650  LIPASE 22   No results for input(s): AMMONIA in the last 168 hours. Coagulation profile No results for input(s): INR, PROTIME in the last 168 hours.  CBC: Recent Labs  Lab 09/30/19 0839 10/01/19 1650 10/02/19 0551 10/03/19 0639 10/04/19 1100  WBC 4.0 9.4 6.1 7.4 5.5  NEUTROABS  --  6.8  --   --   --   HGB 8.3* 9.7* 7.8* 6.7* 8.1*  HCT 24.9* 30.0* 24.0* 20.5* 23.5*  MCV 88.9 91.2 89.2 89.9 87.0  PLT 170 300 162 143* 163   Cardiac Enzymes: No results for input(s): CKTOTAL, CKMB, CKMBINDEX, TROPONINI in the last 168 hours. BNP (last 3 results) No results for input(s): PROBNP in the last 8760 hours. CBG: Recent Labs  Lab 10/04/19 1715 10/04/19 2020 10/05/19 0013 10/05/19 0745 10/05/19 1131  GLUCAP 130* 99 118* 90 109*   D-Dimer: No results for input(s): DDIMER in the last 72 hours. Hgb A1c: No results for input(s): HGBA1C in the last 72 hours. Lipid Profile: No results for input(s): CHOL, HDL, LDLCALC, TRIG, CHOLHDL, LDLDIRECT in the last 72 hours. Thyroid function studies: No results for input(s): TSH, T4TOTAL, T3FREE, THYROIDAB in the  last 72 hours.  Invalid input(s): FREET3 Anemia work up: No results for input(s): VITAMINB12, FOLATE, FERRITIN, TIBC, IRON, RETICCTPCT in the last 72 hours. Sepsis Labs: Recent Labs  Lab 10/01/19 1650 10/01/19 1836 10/01/19 1923 10/02/19 0551 10/03/19 0639 10/04/19 1100  PROCALCITON  0.92  --   --  1.23 3.25  --   WBC 9.4  --   --  6.1 7.4 5.5  LATICACIDVEN  --  0.7 0.7  --   --   --     Microbiology Recent Results (from the past 240 hour(s))  SARS CORONAVIRUS 2 (TAT 6-24 HRS) Nasopharyngeal Nasopharyngeal Swab     Status: None   Collection Time: 10/01/19  4:50 PM   Specimen: Nasopharyngeal Swab  Result Value Ref Range Status   SARS Coronavirus 2 NEGATIVE NEGATIVE Final    Comment: (NOTE) SARS-CoV-2 target nucleic acids are NOT DETECTED. The SARS-CoV-2 RNA is generally detectable in upper and lower respiratory specimens during the acute phase of infection. Negative results do not preclude SARS-CoV-2 infection, do not rule out co-infections with other pathogens, and should not be used as the sole basis for treatment or other patient management decisions. Negative results must be combined with clinical observations, patient history, and epidemiological information. The expected result is Negative. Fact Sheet for Patients: SugarRoll.be Fact Sheet for Healthcare Providers: https://www.woods-mathews.com/ This test is not yet approved or cleared by the [place] FDA and  has been authorized for detection and/or diagnosis of SARS-CoV-2 by FDA under an Emergency Use Authorization (EUA). This EUA will remain  in effect (meaning this test can be used) for the duration of the COVID-19 declaration under Section 56 4(b)(1) of the Act, 21 U.S.C. section 360bbb-3(b)(1), unless the authorization is terminated or revoked sooner. Performed at [facility], [place] [address] [place], [place] [address]   SARS Coronavirus 2 by RT PCR  (hospital order, performed in [facility] hospital lab) Nasopharyngeal Nasopharyngeal Swab     Status: None   Collection Time: 10/01/19 11:04 PM   Specimen: Nasopharyngeal Swab  Result Value Ref Range Status   SARS Coronavirus 2 NEGATIVE NEGATIVE Final    Comment: (NOTE) If result is NEGATIVE SARS-CoV-2 target nucleic acids are NOT DETECTED. The SARS-CoV-2 RNA is generally detectable in upper and lower  respiratory specimens during the acute phase of infection. The lowest  concentration of SARS-CoV-2 viral copies this assay can detect is 250  copies / mL. A negative result does not preclude SARS-CoV-2 infection  and should not be used as the sole basis for treatment or other  patient management decisions.  A negative result may occur with  improper specimen collection / handling, submission of specimen other  than nasopharyngeal swab, presence of viral mutation(s) within the  areas targeted by this assay, and inadequate number of viral copies  (<250 copies / mL). A negative result must be combined with clinical  observations, patient history, and epidemiological information. If result is POSITIVE SARS-CoV-2 target nucleic acids are DETECTED. The SARS-CoV-2 RNA is generally detectable in upper and lower  respiratory specimens dur ing the acute phase of infection.  Positive  results are indicative of active infection with SARS-CoV-2.  Clinical  correlation with patient history and other diagnostic information is  necessary to determine patient infection status.  Positive results do  not rule out bacterial infection or co-infection with other viruses. If result is PRESUMPTIVE POSTIVE SARS-CoV-2 nucleic acids MAY BE PRESENT.   A presumptive positive result was obtained on the submitted specimen  and confirmed on repeat testing.  While 2019 novel coronavirus  (SARS-CoV-2) nucleic acids may be present in the submitted sample  additional confirmatory testing may be necessary for  epidemiological  and / or clinical management purposes  to differentiate between  SARS-CoV-2 and other Sarbecovirus currently known to infect humans.  If clinically indicated additional testing with an alternate test  methodology [phone]) is advised. The SARS-CoV-2 RNA is generally  detectable in upper and lower respiratory sp ecimens during the acute  phase of infection. The expected result is Negative. Fact Sheet for Patients:  StrictlyIdeas.no Fact Sheet for Healthcare Providers: BankingDealers.co.za This test is not yet approved or cleared by the [place] FDA and has been authorized for detection and/or diagnosis of SARS-CoV-2 by FDA under an Emergency Use Authorization (EUA).  This EUA will remain in effect (meaning this test can be used) for the duration of the COVID-19 declaration under Section 564(b)(1) of the Act, 21 U.S.C. section 360bbb-3(b)(1), unless the authorization is terminated or revoked sooner. Performed at [facility], [place], [place] [address]   SARS CORONAVIRUS 2 (TAT 6-24 HRS) Nasopharyngeal Nasopharyngeal Swab     Status: None   Collection Time: 10/04/19  5:13 PM   Specimen: Nasopharyngeal Swab  Result Value Ref Range Status   SARS Coronavirus 2 NEGATIVE NEGATIVE Final    Comment: (NOTE) SARS-CoV-2 target nucleic acids are NOT DETECTED. The SARS-CoV-2 RNA is generally detectable in upper and lower respiratory specimens during the acute phase of infection. Negative results do not preclude SARS-CoV-2 infection, do not rule out co-infections with other pathogens, and should not be used as the sole basis for treatment or other patient management decisions. Negative results must be combined with clinical observations, patient history, and epidemiological information. The expected result is Negative. Fact Sheet for Patients: SugarRoll.be Fact Sheet for  Healthcare Providers: https://www.woods-mathews.com/ This test is not yet approved or cleared by the [place] FDA and  has been authorized for detection and/or diagnosis of SARS-CoV-2 by FDA under an Emergency Use Authorization (EUA). This EUA will remain  in effect (meaning this test can be used) for the duration of the COVID-19 declaration under Section 56 4(b)(1) of the Act, 21 U.S.C. section 360bbb-3(b)(1), unless the authorization is terminated or revoked sooner. Performed at [facility], [place] [address] [place], [place] [address]     Procedures and diagnostic studies:  No results found.  Medications:    amLODipine  10 mg Oral Daily   carvedilol  12.5 mg Oral BID WC   chlorhexidine  15 mL Mouth Rinse BID   Chlorhexidine Gluconate Cloth  6 each Topical Daily   clopidogrel  75 mg Oral Daily   epoetin (EPOGEN/PROCRIT) injection  10,000 Units Intravenous Q T,Th,Sa-HD   heparin  5,000 Units Subcutaneous Q8H   hydrALAZINE  50 mg Oral Q8H   insulin aspart  0-9 Units Subcutaneous TID AC & HS   loratadine  10 mg Oral Daily   losartan  50 mg Oral Daily   mouth rinse  15 mL Mouth Rinse q12n4p   mometasone-formoterol  2 puff Inhalation BID   oxybutynin  15 mg Oral Daily   pantoprazole  40 mg Oral Daily   pravastatin  40 mg Oral QHS   scopolamine  1 patch Transdermal Q72H   sodium chloride flush  3 mL Intravenous Q12H   torsemide  40 mg Oral Q M,W,F,Su-1800   Continuous Infusions:  sodium chloride       LOS: 4 days   Nechama Escutia  [facility] Pager [phone].   *Please refer to [email or web address], password [email or web address] to get updated schedule on who will round on this patient, as hospitalists switch teams weekly. If 7PM-7AM, please contact night-coverage  at [email or web address], password [email or web address] for any overnight needs.  10/05/2019, 12:50 PM

## 2019-10-05 NOTE — Progress Notes (Signed)
   10/05/19 1900  Clinical Encounter Type  Visited With Patient  Visit Type Follow-up  Spiritual Encounters  Spiritual Needs Brochure   Chaplain followed up with the patient from an earlier visit. Upon arrival, the patient appeared to be calm and breathing much more easily. The patient confirmed that she is feeling better and breathing easier today. The patient reported that she received blood and had hemodialysis. The patient requested another AD packet, which this chaplain provided. She will complete the paperwork and ask her nurse to page the on-call chaplain tomorrow, 10/06/19 to finalize the documents when ready. The patient expressed gratitude for the visit and follow-up.

## 2019-10-06 LAB — GLUCOSE, CAPILLARY
Glucose-Capillary: 95 mg/dL (ref 70–99)
Glucose-Capillary: 111 mg/dL — ABNORMAL HIGH (ref 70–99)
Glucose-Capillary: 108 mg/dL — ABNORMAL HIGH (ref 70–99)
Glucose-Capillary: 154 mg/dL — ABNORMAL HIGH (ref 70–99)

## 2019-10-06 NOTE — Progress Notes (Signed)
   10/06/19 0900  Clinical Encounter Type  Visited With Patient;Health care provider  Visit Type Follow-up  Spiritual Encounters  Spiritual Needs Brochure  Ch was called to complete AD document. There were not enough volunteers available to serve as the witnesses. Ch will make another attempt this afternoon.

## 2019-10-06 NOTE — Progress Notes (Signed)
[facility]  ROUNDING NOTE   Subjective:   Doing fair.  Denies any acute complaints Denies shortness of breath No nausea or vomiting reported  Objective:  Vital signs in last 24 hours:  Temp:  [97.9 F (36.6 C)] 97.9 F (36.6 C) (11/16 0451) Pulse Rate:  [70-77] 70 (11/16 0451) Resp:  [17] 17 (11/15 1751) BP: (129-137)/(44-46) 129/46 (11/16 0451) SpO2:  [97 %-98 %] 97 % (11/16 0451) Weight:  [60.7 kg] 60.7 kg (11/16 0500)  Weight change: 0.4 kg Filed Weights   10/04/19 0500 10/05/19 0500 10/06/19 0500  Weight: 61.4 kg 60.3 kg 60.7 kg    Intake/Output: I/O last 3 completed shifts: In: 1060 [P.O.:1060] Out: 900 [Urine:900]   Intake/Output this shift:  Total I/O In: 630 [P.O.:630] Out: -    Physical Exam: General: NAD, laying in the bed,   Head: [place]/AT, moist oral mucous membranes  Eyes:  Anicteric  Lungs:   clear to auscultation  Heart: Regular rate and rhythm, +murmur  Abdomen:  Soft, nontender, obese  Extremities: No peripheral edema.  Neurologic: Nonfocal, alert and oriented  Skin: No lesions  Access: Left forearm AVF, good bruit    Basic Metabolic Panel: Recent Labs  Lab 09/30/19 0839 10/01/19 1650 10/02/19 0551 10/03/19 0639 10/04/19 1100  NA 134* 136 136 138 133*  K 4.8 4.7 5.1 4.3 4.2  CL 95* 95* 97* 99 95*  CO2 27 28 26 24 24   GLUCOSE 103* 167* 153* 83 141*  BUN 38* 31* 40* 30* 44*  CREATININE 5.91* 4.63* 5.58* 4.44* 6.33*  CALCIUM 8.6* 8.8* 8.7* 8.2* 7.8*  MG  --   --  2.3  --   --   PHOS 5.4*  --  5.0* 3.3 3.5    Liver Function Tests: Recent Labs  Lab 09/30/19 0839 10/01/19 1650 10/02/19 0551 10/03/19 0639 10/04/19 1100  AST  --  14*  --   --   --   ALT  --  9  --   --   --   ALKPHOS  --  59  --   --   --   BILITOT  --  0.7  --   --   --   PROT  --  6.4*  --   --   --   ALBUMIN 3.6 3.6 3.4* 3.3* 3.0*   Recent Labs  Lab 10/01/19 1650  LIPASE 22   No results for input(s): AMMONIA in the last 168  hours.  CBC: Recent Labs  Lab 09/30/19 0839 10/01/19 1650 10/02/19 0551 10/03/19 0639 10/04/19 1100  WBC 4.0 9.4 6.1 7.4 5.5  NEUTROABS  --  6.8  --   --   --   HGB 8.3* 9.7* 7.8* 6.7* 8.1*  HCT 24.9* 30.0* 24.0* 20.5* 23.5*  MCV 88.9 91.2 89.2 89.9 87.0  PLT 170 300 162 143* 163    Cardiac Enzymes: No results for input(s): CKTOTAL, CKMB, CKMBINDEX, TROPONINI in the last 168 hours.  BNP: Invalid input(s): POCBNP  CBG: Recent Labs  Lab 10/05/19 0745 10/05/19 1131 10/05/19 1655 10/05/19 2226 10/06/19 0747  GLUCAP 90 109* 108* 108* 95    Microbiology: Results for orders placed or performed during the hospital encounter of 10/01/19  SARS CORONAVIRUS 2 (TAT 6-24 HRS) Nasopharyngeal Nasopharyngeal Swab     Status: None   Collection Time: 10/01/19  4:50 PM   Specimen: Nasopharyngeal Swab  Result Value Ref Range Status   SARS Coronavirus 2 NEGATIVE NEGATIVE Final  Comment: (NOTE) SARS-CoV-2 target nucleic acids are NOT DETECTED. The SARS-CoV-2 RNA is generally detectable in upper and lower respiratory specimens during the acute phase of infection. Negative results do not preclude SARS-CoV-2 infection, do not rule out co-infections with other pathogens, and should not be used as the sole basis for treatment or other patient management decisions. Negative results must be combined with clinical observations, patient history, and epidemiological information. The expected result is Negative. Fact Sheet for Patients: SugarRoll.be Fact Sheet for Healthcare Providers: https://www.woods-mathews.com/ This test is not yet approved or cleared by the [place] FDA and  has been authorized for detection and/or diagnosis of SARS-CoV-2 by FDA under an Emergency Use Authorization (EUA). This EUA will remain  in effect (meaning this test can be used) for the duration of the COVID-19 declaration under Section 56 4(b)(1) of the Act, 21  U.S.C. section 360bbb-3(b)(1), unless the authorization is terminated or revoked sooner. Performed at [facility], [place] [address], [place], [place] [address]   SARS Coronavirus 2 by RT PCR (hospital order, performed in [facility] hospital lab) Nasopharyngeal Nasopharyngeal Swab     Status: None   Collection Time: 10/01/19 11:04 PM   Specimen: Nasopharyngeal Swab  Result Value Ref Range Status   SARS Coronavirus 2 NEGATIVE NEGATIVE Final    Comment: (NOTE) If result is NEGATIVE SARS-CoV-2 target nucleic acids are NOT DETECTED. The SARS-CoV-2 RNA is generally detectable in upper and lower  respiratory specimens during the acute phase of infection. The lowest  concentration of SARS-CoV-2 viral copies this assay can detect is 250  copies / mL. A negative result does not preclude SARS-CoV-2 infection  and should not be used as the sole basis for treatment or other  patient management decisions.  A negative result may occur with  improper specimen collection / handling, submission of specimen other  than nasopharyngeal swab, presence of viral mutation(s) within the  areas targeted by this assay, and inadequate number of viral copies  (<250 copies / mL). A negative result must be combined with clinical  observations, patient history, and epidemiological information. If result is POSITIVE SARS-CoV-2 target nucleic acids are DETECTED. The SARS-CoV-2 RNA is generally detectable in upper and lower  respiratory specimens dur ing the acute phase of infection.  Positive  results are indicative of active infection with SARS-CoV-2.  Clinical  correlation with patient history and other diagnostic information is  necessary to determine patient infection status.  Positive results do  not rule out bacterial infection or co-infection with other viruses. If result is PRESUMPTIVE POSTIVE SARS-CoV-2 nucleic acids MAY BE PRESENT.   A presumptive positive result was obtained on the submitted  specimen  and confirmed on repeat testing.  While 2019 novel coronavirus  (SARS-CoV-2) nucleic acids may be present in the submitted sample  additional confirmatory testing may be necessary for epidemiological  and / or clinical management purposes  to differentiate between  SARS-CoV-2 and other Sarbecovirus currently known to infect humans.  If clinically indicated additional testing with an alternate test  methodology [phone]) is advised. The SARS-CoV-2 RNA is generally  detectable in upper and lower respiratory sp ecimens during the acute  phase of infection. The expected result is Negative. Fact Sheet for Patients:  StrictlyIdeas.no Fact Sheet for Healthcare Providers: BankingDealers.co.za This test is not yet approved or cleared by the [place] FDA and has been authorized for detection and/or diagnosis of SARS-CoV-2 by FDA under an Emergency Use Authorization (EUA).  This EUA will  remain in effect (meaning this test can be used) for the duration of the COVID-19 declaration under Section 564(b)(1) of the Act, 21 U.S.C. section 360bbb-3(b)(1), unless the authorization is terminated or revoked sooner. Performed at [facility], [place], [place] [address]   SARS CORONAVIRUS 2 (TAT 6-24 HRS) Nasopharyngeal Nasopharyngeal Swab     Status: None   Collection Time: 10/04/19  5:13 PM   Specimen: Nasopharyngeal Swab  Result Value Ref Range Status   SARS Coronavirus 2 NEGATIVE NEGATIVE Final    Comment: (NOTE) SARS-CoV-2 target nucleic acids are NOT DETECTED. The SARS-CoV-2 RNA is generally detectable in upper and lower respiratory specimens during the acute phase of infection. Negative results do not preclude SARS-CoV-2 infection, do not rule out co-infections with other pathogens, and should not be used as the sole basis for treatment or other patient management decisions. Negative results must be combined  with clinical observations, patient history, and epidemiological information. The expected result is Negative. Fact Sheet for Patients: SugarRoll.be Fact Sheet for Healthcare Providers: https://www.woods-mathews.com/ This test is not yet approved or cleared by the [place] FDA and  has been authorized for detection and/or diagnosis of SARS-CoV-2 by FDA under an Emergency Use Authorization (EUA). This EUA will remain  in effect (meaning this test can be used) for the duration of the COVID-19 declaration under Section 56 4(b)(1) of the Act, 21 U.S.C. section 360bbb-3(b)(1), unless the authorization is terminated or revoked sooner. Performed at [facility], [place] [address], [place], [place] [address]     Coagulation Studies: No results for input(s): LABPROT, INR in the last 72 hours.  Urinalysis: No results for input(s): COLORURINE, LABSPEC, PHURINE, GLUCOSEU, HGBUR, BILIRUBINUR, KETONESUR, PROTEINUR, UROBILINOGEN, NITRITE, LEUKOCYTESUR in the last 72 hours.  Invalid input(s): APPERANCEUR    Imaging: No results found.   Medications:   . sodium chloride     . amLODipine  10 mg Oral Daily  . carvedilol  12.5 mg Oral BID WC  . chlorhexidine  15 mL Mouth Rinse BID  . Chlorhexidine Gluconate Cloth  6 each Topical Daily  . clopidogrel  75 mg Oral Daily  . epoetin (EPOGEN/PROCRIT) injection  10,000 Units Intravenous Q T,Th,Sa-HD  . heparin  5,000 Units Subcutaneous Q8H  . hydrALAZINE  50 mg Oral Q8H  . insulin aspart  0-9 Units Subcutaneous TID AC & HS  . loratadine  10 mg Oral Daily  . losartan  50 mg Oral Daily  . mouth rinse  15 mL Mouth Rinse q12n4p  . mometasone-formoterol  2 puff Inhalation BID  . oxybutynin  15 mg Oral Daily  . pantoprazole  40 mg Oral Daily  . pravastatin  40 mg Oral QHS  . scopolamine  1 patch Transdermal Q72H  . sodium chloride flush  3 mL Intravenous Q12H  . torsemide  40 mg Oral Q  M,W,F,Su-1800   sodium chloride, acetaminophen **OR** acetaminophen, albuterol, HYDROcodone-acetaminophen, ondansetron **OR** ondansetron (ZOFRAN) IV, senna, topiramate  Assessment/ Plan:  [name] is a 73 y.o. white female with end stage renal disease on hemodialysis, hypertension, diabetes mellitus type II, overactive bladder, gout, COPD, congestive heart failure, coronary artery disease, aortic atherosclerosis admitted to [facility] on 10/01/2019 for Respiratory distress [R06.03] ESRD (end stage renal disease) ([place]) [N18.6] Healthcare-associated pneumonia [J18.9] Other hypervolemia [E87.79] Sepsis, due to unspecified organism, unspecified whether acute organ dysfunction present ([place]) [A41.9]  [facility] TTS 60kg  #. ESRD: with volume overload/acute exacerbation of congestive  heart failure/aortic stenosis on admission. Emergent dialysis on admission Evaluate daily for dialysis need.  Ultrafiltration limited by aortic stenosis.  - Continue TTS schecdule.  Next hemodialysis will be planned for Tuesday  #. Anemia of CKD: . Status post PRBC blood transfusion on 11/13 Lab Results  Component Value Date   HGB 8.1 (L) 10/04/2019   - EPO with HD treatment  #. SHPTH - holding binders        LOS: 5 Chistopher Mangino 11/16/20202:17 PM

## 2019-10-06 NOTE — Plan of Care (Signed)

## 2019-10-06 NOTE — Progress Notes (Signed)
Contacted Debra at [facility] to discuss bed offer. Hilda Blades is requesting PT notes. She asked about pt's dialysis. Pt goes to [facility]  on T, Th and Sat at 11:45. Hilda Blades stated that the time for the dialysis may be a problem due to transportation. She stated that she is going to discuss the time with transportation and she will f/u.

## 2019-10-06 NOTE — Progress Notes (Signed)
   10/06/19 1400  Clinical Encounter Type  Visited With Patient  Visit Type Follow-up  Spiritual Encounters  Spiritual Needs Brochure  Ch assisted with the completion of the AD document. The signed and notarized document can be found in the pt's chart.

## 2019-10-06 NOTE — Care Management Important Message (Signed)
Important Message  Patient Details  Name: Phyllis [name] MRN: [id number] Date of Birth: 07/28/1946   Medicare Important Message Given:  Yes     [name] 10/06/2019, 10:45 AM

## 2019-10-06 NOTE — Progress Notes (Addendum)
Progress Note    Phyllis [name]  [id number] DOB: 05-06-1946  DOA: 10/01/2019 PCP: [name], MD      Brief Narrative:    Medical records reviewed and are as summarized below:  Phyllis [name] is an 73 y.o. female with medical history significant for ESRD on hemodialysis (Tuesdays, Thursdays and Saturdays), chronic diastolic CHF, moderate to severe aortic stenosis, COPD, type II DM, hypertension, hyperlipidemia.  She was recently hospitalized from 40 31-11 10 for acute hypoxemic respiratory failure secondary to CHF exacerbation/pulmonary edema that required mechanical ventilation.  She had also received a course of antibiotics for possible pneumonia on that visit.  She was readmitted to the hospital because of increasing shortness of breath and acute hypoxemic respiratory failure.  She was seen in consultation by the nephrologist and she underwent hemodialysis.  She was also seen in consultation by the cardiologist, Dr. Rockey Situ.  Follow-up has been arranged for evaluation for TAVR.      Assessment/Plan:   Principal Problem:   Acute on chronic respiratory failure with hypoxia (HCC) Active Problems:   End stage renal disease on dialysis ([place])   Type 2 diabetes mellitus with other specified complication (HCC)   Acute on chronic diastolic CHF (congestive heart failure) (HCC)   Hyperlipidemia   HTN (hypertension)   Acute on chronic anemia   Body mass index is 24.48 kg/m.    Acute on chronic hypoxemic respiratory failure: Resolved  Acute on chronic anemia/anemia of chronic disease: s/p transfusion with 1 unit of packed red blood cells on 10/03/2019.  ESRD: Plan for hemodialysis tomorrow.  Follow-up with nephrologist.  Acute on chronic diastolic CHF/moderate to severe aortic stenosis: Hemodialysis as scheduled for fluid management.  Outpatient follow-up with cardiologist for evaluation for TAVR.   PVD/carotid artery disease: Continue Plavix  Type 2  diabetes mellitus: NovoLog as needed.  Hypertension: Continue antihypertensives  Chest pain/costochondritis: Analgesics as needed for pain.    Family Communication/Anticipated D/C date and plan/Code Status   DVT prophylaxis: Heparin Code Status: Full code Family Communication: Plan discussed with the patient Disposition Plan: Possible discharge to skilled nursing facility in tomorrow pending insurance authorization and bed availability.    Subjective:   No new complaints.  Chest pain is better.  Objective:    Vitals:   10/05/19 1411 10/05/19 1751 10/06/19 0451 10/06/19 0500  BP: (!) 133/46 (!) 137/44 (!) 129/46   Pulse: 73 77 70   Resp: 16 17    Temp: 98.3 F (36.8 C)  97.9 F (36.6 C)   TempSrc: Oral  Oral   SpO2: 99% 98% 97%   Weight:    60.7 kg  Height:        Intake/Output Summary (Last 24 hours) at 10/06/2019 1429 Last data filed at 10/06/2019 1300 Gross per 24 hour  Intake 1090 ml  Output 300 ml  Net 790 ml   Filed Weights   10/04/19 0500 10/05/19 0500 10/06/19 0500  Weight: 61.4 kg 60.3 kg 60.7 kg    Exam:  GEN: NAD SKIN: No rash EYES: EOMI ENT: MMM, systolic murmur heard best in the aortic area CV: RRR PULM: CTA B ABD: soft, ND, NT, +BS CNS: AAO x 3, non focal EXT: No edema or tenderness MSK: Anterior chest wall tenderness has improved     Data Reviewed:   I have personally reviewed following labs and imaging studies:  Labs: Labs show the following:   Basic Metabolic Panel: Recent Labs  Lab 09/30/19 0839 10/01/19  1650 10/02/19 0551 10/03/19 0639 10/04/19 1100  NA 134* 136 136 138 133*  K 4.8 4.7 5.1 4.3 4.2  CL 95* 95* 97* 99 95*  CO2 27 28 26 24 24   GLUCOSE 103* 167* 153* 83 141*  BUN 38* 31* 40* 30* 44*  CREATININE 5.91* 4.63* 5.58* 4.44* 6.33*  CALCIUM 8.6* 8.8* 8.7* 8.2* 7.8*  MG  --   --  2.3  --   --   PHOS 5.4*  --  5.0* 3.3 3.5   GFR Estimated Creatinine Clearance: 6.8 mL/min (A) (by C-G formula based on SCr  of 6.33 mg/dL (H)). Liver Function Tests: Recent Labs  Lab 09/30/19 0839 10/01/19 1650 10/02/19 0551 10/03/19 0639 10/04/19 1100  AST  --  14*  --   --   --   ALT  --  9  --   --   --   ALKPHOS  --  59  --   --   --   BILITOT  --  0.7  --   --   --   PROT  --  6.4*  --   --   --   ALBUMIN 3.6 3.6 3.4* 3.3* 3.0*   Recent Labs  Lab 10/01/19 1650  LIPASE 22   No results for input(s): AMMONIA in the last 168 hours. Coagulation profile No results for input(s): INR, PROTIME in the last 168 hours.  CBC: Recent Labs  Lab 09/30/19 0839 10/01/19 1650 10/02/19 0551 10/03/19 0639 10/04/19 1100  WBC 4.0 9.4 6.1 7.4 5.5  NEUTROABS  --  6.8  --   --   --   HGB 8.3* 9.7* 7.8* 6.7* 8.1*  HCT 24.9* 30.0* 24.0* 20.5* 23.5*  MCV 88.9 91.2 89.2 89.9 87.0  PLT 170 300 162 143* 163   Cardiac Enzymes: No results for input(s): CKTOTAL, CKMB, CKMBINDEX, TROPONINI in the last 168 hours. BNP (last 3 results) No results for input(s): PROBNP in the last 8760 hours. CBG: Recent Labs  Lab 10/05/19 0745 10/05/19 1131 10/05/19 1655 10/05/19 2226 10/06/19 0747  GLUCAP 90 109* 108* 108* 95   D-Dimer: No results for input(s): DDIMER in the last 72 hours. Hgb A1c: No results for input(s): HGBA1C in the last 72 hours. Lipid Profile: No results for input(s): CHOL, HDL, LDLCALC, TRIG, CHOLHDL, LDLDIRECT in the last 72 hours. Thyroid function studies: No results for input(s): TSH, T4TOTAL, T3FREE, THYROIDAB in the last 72 hours.  Invalid input(s): FREET3 Anemia work up: No results for input(s): VITAMINB12, FOLATE, FERRITIN, TIBC, IRON, RETICCTPCT in the last 72 hours. Sepsis Labs: Recent Labs  Lab 10/01/19 1650 10/01/19 1836 10/01/19 1923 10/02/19 0551 10/03/19 0639 10/04/19 1100  PROCALCITON 0.92  --   --  1.23 3.25  --   WBC 9.4  --   --  6.1 7.4 5.5  LATICACIDVEN  --  0.7 0.7  --   --   --     Microbiology Recent Results (from the past 240 hour(s))  SARS CORONAVIRUS 2 (TAT  6-24 HRS) Nasopharyngeal Nasopharyngeal Swab     Status: None   Collection Time: 10/01/19  4:50 PM   Specimen: Nasopharyngeal Swab  Result Value Ref Range Status   SARS Coronavirus 2 NEGATIVE NEGATIVE Final    Comment: (NOTE) SARS-CoV-2 target nucleic acids are NOT DETECTED. The SARS-CoV-2 RNA is generally detectable in upper and lower respiratory specimens during the acute phase of infection. Negative results do not preclude SARS-CoV-2 infection, do not rule out co-infections with other  pathogens, and should not be used as the sole basis for treatment or other patient management decisions. Negative results must be combined with clinical observations, patient history, and epidemiological information. The expected result is Negative. Fact Sheet for Patients: SugarRoll.be Fact Sheet for Healthcare Providers: https://www.woods-mathews.com/ This test is not yet approved or cleared by the [place] FDA and  has been authorized for detection and/or diagnosis of SARS-CoV-2 by FDA under an Emergency Use Authorization (EUA). This EUA will remain  in effect (meaning this test can be used) for the duration of the COVID-19 declaration under Section 56 4(b)(1) of the Act, 21 U.S.C. section 360bbb-3(b)(1), unless the authorization is terminated or revoked sooner. Performed at [facility], [place] [address], [place], [place] [address]   SARS Coronavirus 2 by RT PCR (hospital order, performed in [facility] hospital lab) Nasopharyngeal Nasopharyngeal Swab     Status: None   Collection Time: 10/01/19 11:04 PM   Specimen: Nasopharyngeal Swab  Result Value Ref Range Status   SARS Coronavirus 2 NEGATIVE NEGATIVE Final    Comment: (NOTE) If result is NEGATIVE SARS-CoV-2 target nucleic acids are NOT DETECTED. The SARS-CoV-2 RNA is generally detectable in upper and lower  respiratory specimens during the acute phase of infection. The lowest   concentration of SARS-CoV-2 viral copies this assay can detect is 250  copies / mL. A negative result does not preclude SARS-CoV-2 infection  and should not be used as the sole basis for treatment or other  patient management decisions.  A negative result may occur with  improper specimen collection / handling, submission of specimen other  than nasopharyngeal swab, presence of viral mutation(s) within the  areas targeted by this assay, and inadequate number of viral copies  (<250 copies / mL). A negative result must be combined with clinical  observations, patient history, and epidemiological information. If result is POSITIVE SARS-CoV-2 target nucleic acids are DETECTED. The SARS-CoV-2 RNA is generally detectable in upper and lower  respiratory specimens dur ing the acute phase of infection.  Positive  results are indicative of active infection with SARS-CoV-2.  Clinical  correlation with patient history and other diagnostic information is  necessary to determine patient infection status.  Positive results do  not rule out bacterial infection or co-infection with other viruses. If result is PRESUMPTIVE POSTIVE SARS-CoV-2 nucleic acids MAY BE PRESENT.   A presumptive positive result was obtained on the submitted specimen  and confirmed on repeat testing.  While 2019 novel coronavirus  (SARS-CoV-2) nucleic acids may be present in the submitted sample  additional confirmatory testing may be necessary for epidemiological  and / or clinical management purposes  to differentiate between  SARS-CoV-2 and other Sarbecovirus currently known to infect humans.  If clinically indicated additional testing with an alternate test  methodology [phone]) is advised. The SARS-CoV-2 RNA is generally  detectable in upper and lower respiratory sp ecimens during the acute  phase of infection. The expected result is Negative. Fact Sheet for Patients:  StrictlyIdeas.no Fact Sheet  for Healthcare Providers: BankingDealers.co.za This test is not yet approved or cleared by the [place] FDA and has been authorized for detection and/or diagnosis of SARS-CoV-2 by FDA under an Emergency Use Authorization (EUA).  This EUA will remain in effect (meaning this test can be used) for the duration of the COVID-19 declaration under Section 564(b)(1) of the Act, 21 U.S.C. section 360bbb-3(b)(1), unless the authorization is terminated or revoked sooner. Performed at [facility], [address] [place], [place] [address]   SARS CORONAVIRUS 2 (TAT 6-24 HRS) Nasopharyngeal Nasopharyngeal Swab     Status: None   Collection Time: 10/04/19  5:13 PM   Specimen: Nasopharyngeal Swab  Result Value Ref Range Status   SARS Coronavirus 2 NEGATIVE NEGATIVE Final    Comment: (NOTE) SARS-CoV-2 target nucleic acids are NOT DETECTED. The SARS-CoV-2 RNA is generally detectable in upper and lower respiratory specimens during the acute phase of infection. Negative results do not preclude SARS-CoV-2 infection, do not rule out co-infections with other pathogens, and should not be used as the sole basis for treatment or other patient management decisions. Negative results must be combined with clinical observations, patient history, and epidemiological information. The expected result is Negative. Fact Sheet for Patients: SugarRoll.be Fact Sheet for Healthcare Providers: https://www.woods-mathews.com/ This test is not yet approved or cleared by the [place] FDA and  has been authorized for detection and/or diagnosis of SARS-CoV-2 by FDA under an Emergency Use Authorization (EUA). This EUA will remain  in effect (meaning this test can be used) for the duration of the COVID-19 declaration under Section 56 4(b)(1) of the Act, 21 U.S.C. section 360bbb-3(b)(1), unless the authorization is terminated or revoked sooner.  Performed at [facility], [place] [address], [place], [place] [address]     Procedures and diagnostic studies:  No results found.  Medications:   . amLODipine  10 mg Oral Daily  . carvedilol  12.5 mg Oral BID WC  . chlorhexidine  15 mL Mouth Rinse BID  . Chlorhexidine Gluconate Cloth  6 each Topical Daily  . clopidogrel  75 mg Oral Daily  . epoetin (EPOGEN/PROCRIT) injection  10,000 Units Intravenous Q T,Th,Sa-HD  . heparin  5,000 Units Subcutaneous Q8H  . hydrALAZINE  50 mg Oral Q8H  . insulin aspart  0-9 Units Subcutaneous TID AC & HS  . loratadine  10 mg Oral Daily  . losartan  50 mg Oral Daily  . mouth rinse  15 mL Mouth Rinse q12n4p  . mometasone-formoterol  2 puff Inhalation BID  . oxybutynin  15 mg Oral Daily  . pantoprazole  40 mg Oral Daily  . pravastatin  40 mg Oral QHS  . scopolamine  1 patch Transdermal Q72H  . sodium chloride flush  3 mL Intravenous Q12H  . torsemide  40 mg Oral Q M,W,F,Su-1800   Continuous Infusions: . sodium chloride       LOS: 5 days   Judd Mccubbin  [facility] Pager [phone].   *Please refer to [email or web address], password [email or web address] to get updated schedule on who will round on this patient, as hospitalists switch teams weekly. If 7PM-7AM, please contact night-coverage at [email or web address], password [email or web address] for any overnight needs.  10/06/2019, 2:29 PM

## 2019-10-07 ENCOUNTER — Ambulatory Visit: Payer: Medicare Other | Admitting: Physician Assistant

## 2019-10-07 LAB — GLUCOSE, CAPILLARY
Glucose-Capillary: 121 mg/dL — ABNORMAL HIGH (ref 70–99)
Glucose-Capillary: 74 mg/dL (ref 70–99)
Glucose-Capillary: 185 mg/dL — ABNORMAL HIGH (ref 70–99)
Glucose-Capillary: 143 mg/dL — ABNORMAL HIGH (ref 70–99)

## 2019-10-07 NOTE — Progress Notes (Signed)
Patients blood pressure has been running on the low side most of the day and she had scheduled blood pressure medications. Messaged Dr. Mal Misty and he told me to hold the blood pressure medications on both occasions that I messaged him on d/t patients low blood pressure.

## 2019-10-07 NOTE — Progress Notes (Addendum)
Progress Note    Phyllis [name]  [id number] DOB: 1946/08/18  DOA: 10/01/2019 PCP: Phyllis Harrier, MD      Brief Narrative:    Medical records reviewed and are as summarized below:  Phyllis [name] is an 73 y.o. female with medical history significant for ESRD on hemodialysis (Tuesdays, Thursdays and Saturdays), chronic diastolic CHF, moderate to severe aortic stenosis, COPD, type II DM, hypertension, hyperlipidemia.  She was recently hospitalized from 64 31-11 10 for acute hypoxemic respiratory failure secondary to CHF exacerbation/pulmonary edema that required mechanical ventilation.  She had also received a course of antibiotics for possible pneumonia on that visit.  She was readmitted to the hospital because of increasing shortness of breath and acute hypoxemic respiratory failure.  She was seen in consultation by the nephrologist and she underwent hemodialysis.  She was also seen in consultation by the cardiologist, Dr. Rockey Situ.  Follow-up has been arranged for evaluation for TAVR.      Assessment/Plan:   Principal Problem:   Acute on chronic respiratory failure with hypoxia (HCC) Active Problems:   End stage renal disease on dialysis ([place])   Type 2 diabetes mellitus with other specified complication (HCC)   Acute on chronic diastolic CHF (congestive heart failure) (HCC)   Hyperlipidemia   HTN (hypertension)   Acute on chronic anemia   Body mass index is 25.04 kg/m.    Acute on chronic hypoxemic respiratory failure: Resolved  Acute on chronic anemia/anemia of chronic disease: s/p transfusion with 1 unit of packed red blood cells on 10/03/2019.  H&H is stable  ESRD: She had hemodialysis today which she tolerated.  Acute on chronic diastolic CHF/moderate to severe aortic stenosis: Hemodialysis as scheduled for fluid management.  Outpatient follow-up with cardiologist for evaluation for TAVR.   PVD/carotid artery disease: Continue Plavix  Type 2  diabetes mellitus: NovoLog as needed.  Hypertension: Continue antihypertensives  Chest pain/costochondritis: Analgesics as needed for pain.    Family Communication/Anticipated D/C date and plan/Code Status   DVT prophylaxis: Heparin Code Status: Full code Family Communication: Plan discussed with the patient Disposition Plan: Possible discharge to skilled nursing facility tomorrow pending insurance authorization and bed availability.    Subjective:   She said chest pain bothered her last night but it is better today.  No shortness of breath or any other problems.  She was eating lunch at the time of my visit.  Objective:    Vitals:   10/07/19 1230 10/07/19 1245 10/07/19 1300 10/07/19 1335  BP: (!) 136/45 (!) 125/44  (!) 137/42  Pulse: 73 75  77  Resp: (!) 23 17  18   Temp:   98.7 F (37.1 C) 97.6 F (36.4 C)  TempSrc:   Oral   SpO2:   100% 99%  Weight:      Height:        Intake/Output Summary (Last 24 hours) at 10/07/2019 1548 Last data filed at 10/07/2019 1245 Gross per 24 hour  Intake 480 ml  Output 1902 ml  Net -1422 ml   Filed Weights   10/06/19 0500 10/07/19 0434 10/07/19 0940  Weight: 60.7 kg 58.9 kg 62.1 kg    Exam:   GEN: NAD SKIN: No rash EYES: EOMI ENT: MMM,  CV: RRR, systolic murmur loudest in aortic area PULM: CTA B ABD: soft, ND, NT, +BS CNS: AAO x 3, non focal EXT: No edema or tenderness      Data Reviewed:   I have personally reviewed following labs  and imaging studies:  Labs: Labs show the following:   Basic Metabolic Panel: Recent Labs  Lab 10/01/19 1650 10/02/19 0551 10/03/19 0639 10/04/19 1100  NA 136 136 138 133*  K 4.7 5.1 4.3 4.2  CL 95* 97* 99 95*  CO2 28 26 24 24   GLUCOSE 167* 153* 83 141*  BUN 31* 40* 30* 44*  CREATININE 4.63* 5.58* 4.44* 6.33*  CALCIUM 8.8* 8.7* 8.2* 7.8*  MG  --  2.3  --   --   PHOS  --  5.0* 3.3 3.5   GFR Estimated Creatinine Clearance: 6.9 mL/min (A) (by C-G formula based on SCr  of 6.33 mg/dL (H)). Liver Function Tests: Recent Labs  Lab 10/01/19 1650 10/02/19 0551 10/03/19 0639 10/04/19 1100  AST 14*  --   --   --   ALT 9  --   --   --   ALKPHOS 59  --   --   --   BILITOT 0.7  --   --   --   PROT 6.4*  --   --   --   ALBUMIN 3.6 3.4* 3.3* 3.0*   Recent Labs  Lab 10/01/19 1650  LIPASE 22   No results for input(s): AMMONIA in the last 168 hours. Coagulation profile No results for input(s): INR, PROTIME in the last 168 hours.  CBC: Recent Labs  Lab 10/01/19 1650 10/02/19 0551 10/03/19 0639 10/04/19 1100  WBC 9.4 6.1 7.4 5.5  NEUTROABS 6.8  --   --   --   HGB 9.7* 7.8* 6.7* 8.1*  HCT 30.0* 24.0* 20.5* 23.5*  MCV 91.2 89.2 89.9 87.0  PLT 300 162 143* 163   Cardiac Enzymes: No results for input(s): CKTOTAL, CKMB, CKMBINDEX, TROPONINI in the last 168 hours. BNP (last 3 results) No results for input(s): PROBNP in the last 8760 hours. CBG: Recent Labs  Lab 10/06/19 1144 10/06/19 1633 10/06/19 2145 10/07/19 0833 10/07/19 1338  GLUCAP 111* 108* 154* 121* 74   D-Dimer: No results for input(s): DDIMER in the last 72 hours. Hgb A1c: No results for input(s): HGBA1C in the last 72 hours. Lipid Profile: No results for input(s): CHOL, HDL, LDLCALC, TRIG, CHOLHDL, LDLDIRECT in the last 72 hours. Thyroid function studies: No results for input(s): TSH, T4TOTAL, T3FREE, THYROIDAB in the last 72 hours.  Invalid input(s): FREET3 Anemia work up: No results for input(s): VITAMINB12, FOLATE, FERRITIN, TIBC, IRON, RETICCTPCT in the last 72 hours. Sepsis Labs: Recent Labs  Lab 10/01/19 1650 10/01/19 1836 10/01/19 1923 10/02/19 0551 10/03/19 0639 10/04/19 1100  PROCALCITON 0.92  --   --  1.23 3.25  --   WBC 9.4  --   --  6.1 7.4 5.5  LATICACIDVEN  --  0.7 0.7  --   --   --     Microbiology Recent Results (from the past 240 hour(s))  SARS CORONAVIRUS 2 (TAT 6-24 HRS) Nasopharyngeal Nasopharyngeal Swab     Status: None   Collection Time:  10/01/19  4:50 PM   Specimen: Nasopharyngeal Swab  Result Value Ref Range Status   SARS Coronavirus 2 NEGATIVE NEGATIVE Final    Comment: (NOTE) SARS-CoV-2 target nucleic acids are NOT DETECTED. The SARS-CoV-2 RNA is generally detectable in upper and lower respiratory specimens during the acute phase of infection. Negative results do not preclude SARS-CoV-2 infection, do not rule out co-infections with other pathogens, and should not be used as the sole basis for treatment or other patient management decisions. Negative results must be  combined with clinical observations, patient history, and epidemiological information. The expected result is Negative. Fact Sheet for Patients: SugarRoll.be Fact Sheet for Healthcare Providers: https://www.woods-mathews.com/ This test is not yet approved or cleared by the [place] FDA and  has been authorized for detection and/or diagnosis of SARS-CoV-2 by FDA under an Emergency Use Authorization (EUA). This EUA will remain  in effect (meaning this test can be used) for the duration of the COVID-19 declaration under Section 56 4(b)(1) of the Act, 21 U.S.C. section 360bbb-3(b)(1), unless the authorization is terminated or revoked sooner. Performed at [facility], [place] [address], [place], [place] [address]   SARS Coronavirus 2 by RT PCR (hospital order, performed in [facility] hospital lab) Nasopharyngeal Nasopharyngeal Swab     Status: None   Collection Time: 10/01/19 11:04 PM   Specimen: Nasopharyngeal Swab  Result Value Ref Range Status   SARS Coronavirus 2 NEGATIVE NEGATIVE Final    Comment: (NOTE) If result is NEGATIVE SARS-CoV-2 target nucleic acids are NOT DETECTED. The SARS-CoV-2 RNA is generally detectable in upper and lower  respiratory specimens during the acute phase of infection. The lowest  concentration of SARS-CoV-2 viral copies this assay can detect is 250  copies / mL. A  negative result does not preclude SARS-CoV-2 infection  and should not be used as the sole basis for treatment or other  patient management decisions.  A negative result may occur with  improper specimen collection / handling, submission of specimen other  than nasopharyngeal swab, presence of viral mutation(s) within the  areas targeted by this assay, and inadequate number of viral copies  (<250 copies / mL). A negative result must be combined with clinical  observations, patient history, and epidemiological information. If result is POSITIVE SARS-CoV-2 target nucleic acids are DETECTED. The SARS-CoV-2 RNA is generally detectable in upper and lower  respiratory specimens dur ing the acute phase of infection.  Positive  results are indicative of active infection with SARS-CoV-2.  Clinical  correlation with patient history and other diagnostic information is  necessary to determine patient infection status.  Positive results do  not rule out bacterial infection or co-infection with other viruses. If result is PRESUMPTIVE POSTIVE SARS-CoV-2 nucleic acids MAY BE PRESENT.   A presumptive positive result was obtained on the submitted specimen  and confirmed on repeat testing.  While 2019 novel coronavirus  (SARS-CoV-2) nucleic acids may be present in the submitted sample  additional confirmatory testing may be necessary for epidemiological  and / or clinical management purposes  to differentiate between  SARS-CoV-2 and other Sarbecovirus currently known to infect humans.  If clinically indicated additional testing with an alternate test  methodology [phone]) is advised. The SARS-CoV-2 RNA is generally  detectable in upper and lower respiratory sp ecimens during the acute  phase of infection. The expected result is Negative. Fact Sheet for Patients:  StrictlyIdeas.no Fact Sheet for Healthcare Providers: BankingDealers.co.za This test is not  yet approved or cleared by the [place] FDA and has been authorized for detection and/or diagnosis of SARS-CoV-2 by FDA under an Emergency Use Authorization (EUA).  This EUA will remain in effect (meaning this test can be used) for the duration of the COVID-19 declaration under Section 564(b)(1) of the Act, 21 U.S.C. section 360bbb-3(b)(1), unless the authorization is terminated or revoked sooner. Performed at [facility], [place], [place] [id number]   SARS CORONAVIRUS 2 (TAT 6-24 HRS) Nasopharyngeal Nasopharyngeal Swab     Status:  None   Collection Time: 10/04/19  5:13 PM   Specimen: Nasopharyngeal Swab  Result Value Ref Range Status   SARS Coronavirus 2 NEGATIVE NEGATIVE Final    Comment: (NOTE) SARS-CoV-2 target nucleic acids are NOT DETECTED. The SARS-CoV-2 RNA is generally detectable in upper and lower respiratory specimens during the acute phase of infection. Negative results do not preclude SARS-CoV-2 infection, do not rule out co-infections with other pathogens, and should not be used as the sole basis for treatment or other patient management decisions. Negative results must be combined with clinical observations, patient history, and epidemiological information. The expected result is Negative. Fact Sheet for Patients: SugarRoll.be Fact Sheet for Healthcare Providers: https://www.woods-mathews.com/ This test is not yet approved or cleared by the [place] FDA and  has been authorized for detection and/or diagnosis of SARS-CoV-2 by FDA under an Emergency Use Authorization (EUA). This EUA will remain  in effect (meaning this test can be used) for the duration of the COVID-19 declaration under Section 56 4(b)(1) of the Act, 21 U.S.C. section 360bbb-3(b)(1), unless the authorization is terminated or revoked sooner. Performed at [facility], [place] [address] [place], [place] [address]      Procedures and diagnostic studies:  No results found.  Medications:   . amLODipine  10 mg Oral Daily  . carvedilol  12.5 mg Oral BID WC  . chlorhexidine  15 mL Mouth Rinse BID  . Chlorhexidine Gluconate Cloth  6 each Topical Daily  . clopidogrel  75 mg Oral Daily  . epoetin (EPOGEN/PROCRIT) injection  10,000 Units Intravenous Q T,Th,Sa-HD  . heparin  5,000 Units Subcutaneous Q8H  . hydrALAZINE  50 mg Oral Q8H  . insulin aspart  0-9 Units Subcutaneous TID AC & HS  . loratadine  10 mg Oral Daily  . losartan  50 mg Oral Daily  . mouth rinse  15 mL Mouth Rinse q12n4p  . mometasone-formoterol  2 puff Inhalation BID  . oxybutynin  15 mg Oral Daily  . pantoprazole  40 mg Oral Daily  . pravastatin  40 mg Oral QHS  . scopolamine  1 patch Transdermal Q72H  . sodium chloride flush  3 mL Intravenous Q12H  . torsemide  40 mg Oral Q M,W,F,Su-1800   Continuous Infusions: . sodium chloride       LOS: 6 days   Raia Amico  [facility] Pager [phone].   *Please refer to [email or web address], password [email or web address] to get updated schedule on who will round on this patient, as hospitalists switch teams weekly. If 7PM-7AM, please contact night-coverage at [email or web address], password [email or web address] for any overnight needs.  10/07/2019, 3:48 PM

## 2019-10-07 NOTE — Progress Notes (Signed)
This note also relates to the following rows which could not be included: Pulse Rate - Cannot attach notes to unvalidated device data Resp - Cannot attach notes to unvalidated device data BP - Cannot attach notes to unvalidated device data    10/07/19 1300  Vital Signs  Temp 98.7 F (37.1 C)  Temp Source Oral  Pulse Rate Source Monitor  Oxygen Therapy  SpO2 100 %  O2 Device Room Air  TX COMPLETED PT TOLERATED WELL NO C/OS NO DISTRESS NOTED UFG 1.6L AVF+/+

## 2019-10-07 NOTE — Progress Notes (Signed)
[facility]  ROUNDING NOTE   Subjective:   Doing fair.  Denies any acute complaints Denies shortness of breath No nausea or vomiting reported Patient seen during dialysis Tolerating well   HEMODIALYSIS FLOWSHEET:  Blood Flow Rate (mL/min): 400 mL/min Arterial Pressure (mmHg): -120 mmHg Venous Pressure (mmHg): 160 mmHg Transmembrane Pressure (mmHg): 600 mmHg Ultrafiltration Rate (mL/min): 700 mL/min Dialysate Flow Rate (mL/min): 600 ml/min Conductivity: Machine : 14 Conductivity: Machine : 14 Dialysis Fluid Bolus: Normal Saline Bolus Amount (mL): 250 mL    Objective:  Vital signs in last 24 hours:  Temp:  [97.4 F (36.3 C)-98.7 F (37.1 C)] 97.6 F (36.4 C) (11/17 1335) Pulse Rate:  [64-77] 77 (11/17 1335) Resp:  [13-23] 18 (11/17 1335) BP: (115-148)/(36-52) 137/42 (11/17 1335) SpO2:  [97 %-100 %] 99 % (11/17 1335) Weight:  [58.9 kg-62.1 kg] 62.1 kg (11/17 0940)  Weight change: -1.8 kg Filed Weights   10/06/19 0500 10/07/19 0434 10/07/19 0940  Weight: 60.7 kg 58.9 kg 62.1 kg    Intake/Output: I/O last 3 completed shifts: In: 1330 [P.O.:1330] Out: 300 [Urine:300]   Intake/Output this shift:  Total I/O In: 240 [P.O.:240] Out: 1602 [Other:1602]   Physical Exam: General: NAD, laying in the bed,   Head: [place]/AT, moist oral mucous membranes  Eyes: Anicteric  Lungs:  clear to auscultation  Heart: Regular rate and rhythm, +murmur  Abdomen:  Soft, nontender, obese  Extremities: No peripheral edema.  Neurologic: Nonfocal, alert and oriented  Skin: No lesions  Access: Left forearm AVF, good bruit    Basic Metabolic Panel: Recent Labs  Lab 10/01/19 1650 10/02/19 0551 10/03/19 0639 10/04/19 1100  NA 136 136 138 133*  K 4.7 5.1 4.3 4.2  CL 95* 97* 99 95*  CO2 28 26 24 24   GLUCOSE 167* 153* 83 141*  BUN 31* 40* 30* 44*  CREATININE 4.63* 5.58* 4.44* 6.33*  CALCIUM 8.8* 8.7* 8.2* 7.8*  MG  --  2.3  --   --   PHOS  --  5.0* 3.3 3.5     Liver Function Tests: Recent Labs  Lab 10/01/19 1650 10/02/19 0551 10/03/19 0639 10/04/19 1100  AST 14*  --   --   --   ALT 9  --   --   --   ALKPHOS 59  --   --   --   BILITOT 0.7  --   --   --   PROT 6.4*  --   --   --   ALBUMIN 3.6 3.4* 3.3* 3.0*   Recent Labs  Lab 10/01/19 1650  LIPASE 22   No results for input(s): AMMONIA in the last 168 hours.  CBC: Recent Labs  Lab 10/01/19 1650 10/02/19 0551 10/03/19 0639 10/04/19 1100  WBC 9.4 6.1 7.4 5.5  NEUTROABS 6.8  --   --   --   HGB 9.7* 7.8* 6.7* 8.1*  HCT 30.0* 24.0* 20.5* 23.5*  MCV 91.2 89.2 89.9 87.0  PLT 300 162 143* 163    Cardiac Enzymes: No results for input(s): CKTOTAL, CKMB, CKMBINDEX, TROPONINI in the last 168 hours.  BNP: Invalid input(s): POCBNP  CBG: Recent Labs  Lab 10/06/19 1144 10/06/19 1633 10/06/19 2145 10/07/19 0833 10/07/19 1338  GLUCAP 111* 108* 154* 121* 108    Microbiology: Results for orders placed or performed during the hospital encounter of 10/01/19  SARS CORONAVIRUS 2 (TAT 6-24 HRS) Nasopharyngeal Nasopharyngeal Swab     Status: None   Collection Time: 10/01/19  4:50  PM   Specimen: Nasopharyngeal Swab  Result Value Ref Range Status   SARS Coronavirus 2 NEGATIVE NEGATIVE Final    Comment: (NOTE) SARS-CoV-2 target nucleic acids are NOT DETECTED. The SARS-CoV-2 RNA is generally detectable in upper and lower respiratory specimens during the acute phase of infection. Negative results do not preclude SARS-CoV-2 infection, do not rule out co-infections with other pathogens, and should not be used as the sole basis for treatment or other patient management decisions. Negative results must be combined with clinical observations, patient history, and epidemiological information. The expected result is Negative. Fact Sheet for Patients: SugarRoll.be Fact Sheet for Healthcare Providers: https://www.woods-mathews.com/ This test is  not yet approved or cleared by the [place] FDA and  has been authorized for detection and/or diagnosis of SARS-CoV-2 by FDA under an Emergency Use Authorization (EUA). This EUA will remain  in effect (meaning this test can be used) for the duration of the COVID-19 declaration under Section 56 4(b)(1) of the Act, 21 U.S.C. section 360bbb-3(b)(1), unless the authorization is terminated or revoked sooner. Performed at [facility], [place] [address], [place], [place] [address]   SARS Coronavirus 2 by RT PCR (hospital order, performed in [facility] hospital lab) Nasopharyngeal Nasopharyngeal Swab     Status: None   Collection Time: 10/01/19 11:04 PM   Specimen: Nasopharyngeal Swab  Result Value Ref Range Status   SARS Coronavirus 2 NEGATIVE NEGATIVE Final    Comment: (NOTE) If result is NEGATIVE SARS-CoV-2 target nucleic acids are NOT DETECTED. The SARS-CoV-2 RNA is generally detectable in upper and lower  respiratory specimens during the acute phase of infection. The lowest  concentration of SARS-CoV-2 viral copies this assay can detect is 250  copies / mL. A negative result does not preclude SARS-CoV-2 infection  and should not be used as the sole basis for treatment or other  patient management decisions.  A negative result may occur with  improper specimen collection / handling, submission of specimen other  than nasopharyngeal swab, presence of viral mutation(s) within the  areas targeted by this assay, and inadequate number of viral copies  (<250 copies / mL). A negative result must be combined with clinical  observations, patient history, and epidemiological information. If result is POSITIVE SARS-CoV-2 target nucleic acids are DETECTED. The SARS-CoV-2 RNA is generally detectable in upper and lower  respiratory specimens dur ing the acute phase of infection.  Positive  results are indicative of active infection with SARS-CoV-2.  Clinical  correlation with patient  history and other diagnostic information is  necessary to determine patient infection status.  Positive results do  not rule out bacterial infection or co-infection with other viruses. If result is PRESUMPTIVE POSTIVE SARS-CoV-2 nucleic acids MAY BE PRESENT.   A presumptive positive result was obtained on the submitted specimen  and confirmed on repeat testing.  While 2019 novel coronavirus  (SARS-CoV-2) nucleic acids may be present in the submitted sample  additional confirmatory testing may be necessary for epidemiological  and / or clinical management purposes  to differentiate between  SARS-CoV-2 and other Sarbecovirus currently known to infect humans.  If clinically indicated additional testing with an alternate test  methodology [phone]) is advised. The SARS-CoV-2 RNA is generally  detectable in upper and lower respiratory sp ecimens during the acute  phase of infection. The expected result is Negative. Fact Sheet for Patients:  StrictlyIdeas.no Fact Sheet for Healthcare Providers: BankingDealers.co.za This test is not yet approved or cleared by the [place]  FDA and has been authorized for detection and/or diagnosis of SARS-CoV-2 by FDA under an Emergency Use Authorization (EUA).  This EUA will remain in effect (meaning this test can be used) for the duration of the COVID-19 declaration under Section 564(b)(1) of the Act, 21 U.S.C. section 360bbb-3(b)(1), unless the authorization is terminated or revoked sooner. Performed at [facility], [place], [place] [address]   SARS CORONAVIRUS 2 (TAT 6-24 HRS) Nasopharyngeal Nasopharyngeal Swab     Status: None   Collection Time: 10/04/19  5:13 PM   Specimen: Nasopharyngeal Swab  Result Value Ref Range Status   SARS Coronavirus 2 NEGATIVE NEGATIVE Final    Comment: (NOTE) SARS-CoV-2 target nucleic acids are NOT DETECTED. The SARS-CoV-2 RNA is generally  detectable in upper and lower respiratory specimens during the acute phase of infection. Negative results do not preclude SARS-CoV-2 infection, do not rule out co-infections with other pathogens, and should not be used as the sole basis for treatment or other patient management decisions. Negative results must be combined with clinical observations, patient history, and epidemiological information. The expected result is Negative. Fact Sheet for Patients: SugarRoll.be Fact Sheet for Healthcare Providers: https://www.woods-mathews.com/ This test is not yet approved or cleared by the [place] FDA and  has been authorized for detection and/or diagnosis of SARS-CoV-2 by FDA under an Emergency Use Authorization (EUA). This EUA will remain  in effect (meaning this test can be used) for the duration of the COVID-19 declaration under Section 56 4(b)(1) of the Act, 21 U.S.C. section 360bbb-3(b)(1), unless the authorization is terminated or revoked sooner. Performed at [facility], [place] [address], [place], [place] [address]     Coagulation Studies: No results for input(s): LABPROT, INR in the last 72 hours.  Urinalysis: No results for input(s): COLORURINE, LABSPEC, PHURINE, GLUCOSEU, HGBUR, BILIRUBINUR, KETONESUR, PROTEINUR, UROBILINOGEN, NITRITE, LEUKOCYTESUR in the last 72 hours.  Invalid input(s): APPERANCEUR    Imaging: No results found.   Medications:   . sodium chloride     . amLODipine  10 mg Oral Daily  . carvedilol  12.5 mg Oral BID WC  . chlorhexidine  15 mL Mouth Rinse BID  . Chlorhexidine Gluconate Cloth  6 each Topical Daily  . clopidogrel  75 mg Oral Daily  . epoetin (EPOGEN/PROCRIT) injection  10,000 Units Intravenous Q T,Th,Sa-HD  . heparin  5,000 Units Subcutaneous Q8H  . hydrALAZINE  50 mg Oral Q8H  . insulin aspart  0-9 Units Subcutaneous TID AC & HS  . loratadine  10 mg Oral Daily  . losartan  50 mg Oral  Daily  . mouth rinse  15 mL Mouth Rinse q12n4p  . mometasone-formoterol  2 puff Inhalation BID  . oxybutynin  15 mg Oral Daily  . pantoprazole  40 mg Oral Daily  . pravastatin  40 mg Oral QHS  . scopolamine  1 patch Transdermal Q72H  . sodium chloride flush  3 mL Intravenous Q12H  . torsemide  40 mg Oral Q M,W,F,Su-1800   sodium chloride, acetaminophen **OR** acetaminophen, albuterol, HYDROcodone-acetaminophen, ondansetron **OR** ondansetron (ZOFRAN) IV, senna, topiramate  Assessment/ Plan:  [name] is a 73 y.o. white female with end stage renal disease on hemodialysis, hypertension, diabetes mellitus type II, overactive bladder, gout, COPD, congestive heart failure, coronary artery disease, aortic atherosclerosis admitted to [facility] on 10/01/2019 for Respiratory distress [R06.03] ESRD (end stage renal disease) ([place]) [N18.6] Healthcare-associated pneumonia [J18.9] Other hypervolemia [E87.79] Sepsis, due to unspecified organism, unspecified  whether acute organ dysfunction present ([place]) [A41.9]  [facility] TTS 60kg  #. ESRD: with volume overload/acute exacerbation of congestive heart failure/aortic stenosis on admission. Emergent dialysis on admission Evaluate daily for dialysis need.  Ultrafiltration limited by aortic stenosis.  - Continue TTS schecdule.  Next hemodialysis will be planned for Tuesday  #. Anemia of CKD: . Status post PRBC blood transfusion on 11/13 Lab Results  Component Value Date   HGB 8.1 (L) 10/04/2019   - EPO with HD treatment  #. [facility] Lab Results  Component Value Date   CALCIUM 7.8 (L) 10/04/2019   PHOS 3.5 10/04/2019   - holding binders for now - may restart phoslo 2 tabs with meals at discharge      LOS: [place] 11/17/20203:42 PM

## 2019-10-07 NOTE — Progress Notes (Signed)
   10/07/19 0940  Neurological  Level of Consciousness Alert  Orientation Level Oriented X4  Respiratory  Respiratory Pattern Regular;Unlabored  Bilateral Breath Sounds Clear;Diminished  Cardiac  ECG Monitor Yes  pt stable for HD TX no c/os

## 2019-10-08 LAB — GLUCOSE, CAPILLARY
Glucose-Capillary: 102 mg/dL — ABNORMAL HIGH (ref 70–99)
Glucose-Capillary: 92 mg/dL (ref 70–99)

## 2019-10-08 LAB — SARS CORONAVIRUS 2 (TAT 6-24 HRS): SARS Coronavirus 2: NEGATIVE

## 2019-10-08 MED ORDER — HYDROCODONE-ACETAMINOPHEN 7.5-325 MG PO TABS: 1.0000 | ORAL_TABLET | Freq: Four times a day (QID) | ORAL | 0 refills | Status: DC | PRN

## 2019-10-08 NOTE — Discharge Summary (Signed)
Physician Discharge Summary  Phyllis [name] [id number] DOB: 03/17/1946 DOA: 10/01/2019  PCP: [name], MD  Admit date: 10/01/2019 Discharge date: 10/08/2019  Admitted From: SNF  Disposition:  SNF   Recommendations for Outpatient Follow-up:  1. Follow up with PCP in 1-2 weeks after discharge from SNF 2. Follow up with Cardiology Dr. Burt Knack in [place] on Nov 23 at 3:40PM       Home Health: TBD at SNF  Equipment/Devices: TBD at SNF  Discharge Condition: Fair  CODE STATUS: FULL Diet recommendation: Diabetic, cardiac, renal  Brief/Interim Summary: Phyllis [name] is a 73 y.o. F with ESRD on HD TThS, dCHF, Anemia of CKD, HTN, PVD of the carotids, DM, and severe AS who presented with dyspnea again and EMS arrived with SpO2 77%.  In the ER, CXR showed multifocal opacities.  Started on BiPAP and empiric antibiotics.  Procalcitonin was downtrending, and so Abx were held and she was admitted for urgent dialysis.        PRINCIPAL HOSPITAL DIAGNOSIS: Acute hypoxic respiratory failure    Discharge Diagnoses:  Acute hypoxic respiratory failure due to flash pulmonary edema due to acute on chronic diastolic CHF in the setting of severe aortic stenosis Patient does not have chronic respiratory failure. As result of her severe aortic stenosis, and dialysis dependence, she has a very labile and challenging fluid status.  She was admitted and underwent dialysis in the hospital, was immediately comfortable afterwards.  Cardiology were consulted who recommended lowering her dry weight  Palliative care were consulted as result of her complicated and challenging medical problem.   Acute on chronic anemia Anemia of chronic kidney disease Transfused 1 unit during this hospitalization  ESRD  Peripheral vascular disease Aspirin has been stopped due to easy bleeding  Diabetes  Hypertension  Costochondritis Pain controlled with symptomatic  treatment.       Discharge Instructions  Discharge Instructions    Increase activity slowly   Complete by: As directed      Allergies as of 10/08/2019      Reactions   Enalapril Maleate Other (See Comments)   Other reaction(s): Headache   Nitrofurantoin Swelling, Rash   Other Reaction: swelling of body   Sulfamethoxazole-trimethoprim Swelling   2,4-d Dimethylamine (amisol) Rash, Other (See Comments)   Other Reaction: h/a   Baclofen Other (See Comments), Nausea Only   lightheadness ,drowsiness , muscle weakness , twitching in hands    Neosporin [neomycin-bacitracin Zn-polymyx] Other (See Comments), Rash   Other Reaction: irritation Skin irritation   Quinine Nausea And Vomiting, Rash, Other (See Comments)   Other Reaction: Vomiting, rash, h/a, vision   Ultram [tramadol] Palpitations   Zocor [simvastatin] Other (See Comments), Rash   Other Reaction: muscle spasms Muscle pain and spasms   Bactrim [sulfamethoxazole-trimethoprim] Swelling   Levodopa Other (See Comments)   Reaction: unknown   Macrodantin [nitrofurantoin Macrocrystal] Swelling   Quinine Derivatives Other (See Comments)   Vertigo,nausea vomiting blurred vision headache ears sensitivity      Medication List    STOP taking these medications   aspirin 81 MG chewable tablet     TAKE these medications   acetaminophen 500 MG tablet Commonly known as: TYLENOL Take 1 tablet (500 mg total) by mouth every 6 (six) hours as needed for mild pain or fever.   albuterol 108 (90 Base) MCG/ACT inhaler Commonly known as: VENTOLIN HFA Inhale 2 puffs into the lungs every 6 (six) hours as needed for wheezing or shortness of breath.   amLODipine 10  MG tablet Commonly known as: NORVASC Take 10 mg by mouth daily.   calcium acetate 667 MG capsule Commonly known as: PHOSLO Take 1,334 mg by mouth 3 (three) times daily with meals.   carvedilol 12.5 MG tablet Commonly known as: COREG Take 12.5 mg by mouth 2 (two) times  daily with a meal.   cetirizine 10 MG tablet Commonly known as: ZYRTEC Take 10 mg by mouth daily as needed for allergies.   clopidogrel 75 MG tablet Commonly known as: PLAVIX Take 1 tablet (75 mg total) by mouth daily.   Fluticasone-Salmeterol 250-50 MCG/DOSE Aepb Commonly known as: ADVAIR Inhale 1 puff into the lungs 2 (two) times daily as needed (for shortness of breath).   hydrALAZINE 50 MG tablet Commonly known as: APRESOLINE Take 1 tablet (50 mg total) by mouth every 8 (eight) hours.   HYDROcodone-acetaminophen 7.5-325 MG tablet Commonly known as: NORCO Take 1 tablet by mouth every 6 (six) hours as needed (pain). What changed:   how much to take  when to take this   lidocaine-prilocaine cream Commonly known as: EMLA Apply 1 application topically as needed (prior to treatment).   losartan 50 MG tablet Commonly known as: COZAAR Take 50 mg by mouth daily.   omeprazole 20 MG capsule Commonly known as: PRILOSEC Take 1 capsule (20 mg total) by mouth 2 (two) times daily before a meal.   ondansetron 4 MG tablet Commonly known as: ZOFRAN Take 4 mg by mouth every 8 (eight) hours as needed.   oxybutynin 15 MG 24 hr tablet Commonly known as: DITROPAN XL Take 15 mg by mouth daily.   pravastatin 40 MG tablet Commonly known as: PRAVACHOL Take 40 mg by mouth at bedtime.   scopolamine 1 MG/3DAYS Commonly known as: TRANSDERM-SCOP Place 1 patch onto the skin every 3 (three) days.   topiramate 25 MG tablet Commonly known as: TOPAMAX Take 1 tablet (25 mg total) by mouth as needed (for headaches).   torsemide 20 MG tablet Commonly known as: DEMADEX Take 2 tablets (40 mg total) by mouth every Monday,Wednesday,Friday, and Sunday at 6 PM.       Contact information for follow-up providers    Rise Mu, PA-C On 10/21/2019.   Specialties: Physician Assistant, Cardiology, Radiology Why: @ 8:30 am Contact information: [address] [place]  [address] [phone]        [name], MD. Schedule an appointment as soon as possible for a visit.   Specialty: Internal Medicine Why: make appt for 1 week after discharge from rehab Contact information: [address] [place] [address] [phone]        Sherren Mocha, MD Follow up on 10/13/2019.   Specialty: Cardiology Why: You have an appointment in [facility] with Dr. Burt Knack for valve on Monday Nov 23 at 3:40PM.  Arrive by 3:25PM Contact information: [id number] [place] [address] [phone]            Contact information for after-discharge care    Destination    [facility]   Service: Skilled Nursing Contact information: [address] [place] [phone]                 Allergies  Allergen Reactions  . Enalapril Maleate Other (See Comments)    Other reaction(s): Headache  . Nitrofurantoin Swelling and Rash    Other Reaction: swelling of body  . Sulfamethoxazole-Trimethoprim Swelling  .  2,4-D Dimethylamine (Amisol) Rash and Other (See Comments)    Other Reaction: h/a  . Baclofen Other (See Comments) and Nausea Only    lightheadness ,drowsiness , muscle weakness , twitching in hands   . Neosporin [Neomycin-Bacitracin Zn-Polymyx] Other (See Comments) and Rash    Other Reaction: irritation Skin irritation   . Quinine Nausea And Vomiting, Rash and Other (See Comments)    Other Reaction: Vomiting, rash, h/a, vision  . Ultram [Tramadol] Palpitations  . Zocor [Simvastatin] Other (See Comments) and Rash    Other Reaction: muscle spasms Muscle pain and spasms  . Bactrim [Sulfamethoxazole-Trimethoprim] Swelling  . Levodopa Other (See Comments)    Reaction: unknown  . Macrodantin [Nitrofurantoin Macrocrystal] Swelling  . Quinine Derivatives Other (See Comments)    Vertigo,nausea vomiting blurred vision headache ears  sensitivity     Consultations:  Cardiology  Nephrology   Procedures/Studies: Dg Chest 1 View  Result Date: 10/01/2019 CLINICAL DATA:  Shortness of breath, respiratory distress EXAM: CHEST  1 VIEW COMPARISON:  09/24/2019 FINDINGS: Multifocal interstitial/airspace opacities, lower lobe predominant. This appearance is new/progressive from the prior. No pleural effusion or pneumothorax. The heart is normal in size.  Thoracic aortic atherosclerosis. Degenerative changes of the visualized thoracolumbar spine. IMPRESSION: Multifocal interstitial/airspace opacities, possibly reflecting interstitial edema given the clinical history. No associated pleural effusions. Technically speaking, in the appropriate clinical setting, atypical/viral pneumonia (including COVID) is within the differential. Electronically Signed   By: Julian Hy M.D.   On: 10/01/2019 17:30   Dg Abd 1 View  Result Date: 09/20/2019 CLINICAL DATA:  OG tube placement EXAM: ABDOMEN - 1 VIEW COMPARISON:  None. FINDINGS: OG tube appears adequately positioned in the stomach with proximal sideholes just below the level of the gastroesophageal junction. Visualized bowel gas pattern is nonobstructive. No evidence of free intraperitoneal air or abnormal fluid collection. Aortic atherosclerosis. IMPRESSION: 1. OG tube appears adequately positioned in the stomach with proximal sideholes just below the level of the gastroesophageal junction. 2. Nonobstructive bowel gas pattern. 3. Aortic atherosclerosis. Electronically Signed   By: Franki Cabot M.D.   On: 09/20/2019 11:52   Ct Chest Wo Contrast  Result Date: 09/19/2019 CLINICAL DATA:  Midsternal chest pain for 2 weeks with shortness of breath. Nonsmoker without history of cancer. EXAM: CT CHEST WITHOUT CONTRAST TECHNIQUE: Multidetector CT imaging of the chest was performed following the standard protocol without IV contrast. COMPARISON:  09/05/2019 chest radiograph. Most recent CT of  09/02/2008 FINDINGS: Cardiovascular: Advanced aortic and branch vessel atherosclerosis. Tortuous thoracic aorta. Mild cardiomegaly, without pericardial effusion. Multivessel coronary artery atherosclerosis. Aortic valve calcification. Mediastinum/Nodes: Hypoattenuating left thyroid nodule of 1.2 cm, new but nonspecific. No mediastinal or definite hilar adenopathy, given limitations of unenhanced CT. Lungs/Pleura: No pleural fluid.  Clear lungs. Upper Abdomen: Nonspecific caudate lobe enlargement. Similar. Normal imaged portions of the spleen, adrenal glands. Renal atrophy bilaterally. An upper pole left renal exophytic 1.8 cm cyst. Pancreatic tail 1.8 cm lesion on 114/2 is minimally enlarged compared to 2009 where it measured 1.5 cm. Calcification in its inferior portion. There is also a incompletely imaged pancreatic body lesion on 125/2, on the order of 1.8 cm-similar. Suggestion of peripancreatic inflammation, especially about the pancreatic tail lesion on 113/2. Cholecystectomy. Musculoskeletal: Lower thoracic spondylosis. No sternal fracture or retrosternal fluid. IMPRESSION: 1.  No acute process in the chest. 2. Coronary artery atherosclerosis. Aortic Atherosclerosis (ICD10-I70.0). 3. Pancreatic cystic lesions, suboptimally and incompletely evaluated. Suspicion of peripancreatic edema. Correlate with symptoms to suggest acute on chronic  pancreatitis. Consider follow-up with dedicated MRI or CT. 4. Chronic caudate lobe prominence, nonspecific. Correlate with any risk factors for cirrhosis. Electronically Signed   By: Abigail Miyamoto M.D.   On: 09/19/2019 12:28   Dg Chest Port 1 View  Result Date: 09/24/2019 CLINICAL DATA:  Pulmonary infiltrates. EXAM: PORTABLE CHEST 1 VIEW COMPARISON:  09/22/2019 FINDINGS: Improved appearance of interstitial and heterogeneous opacity seen on prior exams. Cardiomediastinal contours are stable with cardiac enlargement and dense aortic calcification and tortuosity. No signs of  effusion or dense consolidation. No acute bone finding. IMPRESSION: 1. Improved appearance of presumed edema, potentially superimposed on chronic lung disease. 2. Stable dense aortic calcification and tortuosity. Electronically Signed   By: Zetta Bills M.D.   On: 09/24/2019 10:41   Dg Chest Port 1 View  Result Date: 09/22/2019 CLINICAL DATA:  Pulmonary disease EXAM: PORTABLE CHEST 1 VIEW COMPARISON:  09/20/2019 FINDINGS: Bilateral diffuse interstitial thickening. No pleural effusion or pneumothorax. No focal consolidation. Stable cardiomediastinal silhouette. Thoracic aortic atherosclerosis. No acute osseous abnormality. IMPRESSION: Diffuse bilateral interstitial thickening likely reflecting chronic interstitial disease with possible superimposed interstitial edema or infection. Electronically Signed   By: Kathreen Devoid   On: 09/22/2019 18:04   Dg Chest Port 1 View  Result Date: 09/20/2019 CLINICAL DATA:  Intubation EXAM: PORTABLE CHEST 1 VIEW COMPARISON:  09/20/2019, 5:50 a.m. FINDINGS: Interval placement of endotracheal tube, tip positioned over the lower trachea, approximately 2 cm above the carina. Esophagogastric tube is position with tip and side port below the diaphragm. Mild cardiomegaly. Interval improvement in bilateral heterogeneous and interstitial opacity, most conspicuous in the right midlung. IMPRESSION: 1. Interval placement of endotracheal tube, tip positioned over the lower trachea, approximately 2 cm above the carina. Esophagogastric tube is position with tip and side port below the diaphragm. 2. Interval improvement in bilateral heterogeneous and interstitial opacity, most conspicuous in the right midlung, likely improved edema. Electronically Signed   By: Eddie Candle M.D.   On: 09/20/2019 11:20   Dg Chest Port 1 View  Result Date: 09/20/2019 CLINICAL DATA:  Intubated. Respiratory distress. EXAM: PORTABLE CHEST 1 VIEW COMPARISON:  09/05/2019 FINDINGS: The endotracheal tube tip is  at the thoracic inlet. Borderline enlarged cardiac silhouette. Tortuous and calcified thoracic aorta. Extensive patchy bilateral airspace opacity, greater on the right. No pleural fluid. Thoracic spine degenerative changes. IMPRESSION: 1. Endotracheal tube tip at the thoracic inlet. It is recommended that this be advanced 4.3 cm. 2. Extensive patchy bilateral probable pneumonia. 3. Aortic atherosclerosis. Electronically Signed   By: Claudie Revering M.D.   On: 09/20/2019 06:41      Subjective: She has no chest pain, dyspnea, malaise, weakness.  Discharge Exam: Vitals:   10/08/19 0522 10/08/19 1006  BP: (!) 137/46 (!) 143/43  Pulse: 71 74  Resp: 17   Temp: 98.2 F (36.8 C)   SpO2: 97%    Vitals:   10/07/19 2227 10/08/19 0522 10/08/19 0600 10/08/19 1006  BP: (!) 133/48 (!) 137/46  (!) 143/43  Pulse: 77 71  74  Resp: 17 17    Temp: 98.2 F (36.8 C) 98.2 F (36.8 C)    TempSrc: Oral     SpO2: 100% 97%    Weight:   57.2 kg   Height:        General: Pt is alert, awake, not in acute distress, lying in bed Cardiovascular: RRR, nl [id number], systolic murmur noted.   No LE edema.   Respiratory: Normal respiratory rate and rhythm.  CTAB  without rales or wheezes. Abdominal: Abdomen soft and non-tender.  No distension or HSM.   Neuro/Psych: Strength symmetrically very weak in upper and lower extremities.  Judgment and insight appear normal.   The results of significant diagnostics from this hospitalization (including imaging, microbiology, ancillary and laboratory) are listed below for reference.     Microbiology: Recent Results (from the past 240 hour(s))  SARS CORONAVIRUS 2 (TAT 6-24 HRS) Nasopharyngeal Nasopharyngeal Swab     Status: None   Collection Time: 10/01/19  4:50 PM   Specimen: Nasopharyngeal Swab  Result Value Ref Range Status   SARS Coronavirus 2 NEGATIVE NEGATIVE Final    Comment: (NOTE) SARS-CoV-2 target nucleic acids are NOT DETECTED. The SARS-CoV-2 RNA is generally  detectable in upper and lower respiratory specimens during the acute phase of infection. Negative results do not preclude SARS-CoV-2 infection, do not rule out co-infections with other pathogens, and should not be used as the sole basis for treatment or other patient management decisions. Negative results must be combined with clinical observations, patient history, and epidemiological information. The expected result is Negative. Fact Sheet for Patients: SugarRoll.be Fact Sheet for Healthcare Providers: https://www.woods-mathews.com/ This test is not yet approved or cleared by the [place] FDA and  has been authorized for detection and/or diagnosis of SARS-CoV-2 by FDA under an Emergency Use Authorization (EUA). This EUA will remain  in effect (meaning this test can be used) for the duration of the COVID-19 declaration under Section 56 4(b)(1) of the Act, 21 U.S.C. section 360bbb-3(b)(1), unless the authorization is terminated or revoked sooner. Performed at [facility], [place] [address], [place], [place] [address]   SARS Coronavirus 2 by RT PCR (hospital order, performed in [facility] hospital lab) Nasopharyngeal Nasopharyngeal Swab     Status: None   Collection Time: 10/01/19 11:04 PM   Specimen: Nasopharyngeal Swab  Result Value Ref Range Status   SARS Coronavirus 2 NEGATIVE NEGATIVE Final    Comment: (NOTE) If result is NEGATIVE SARS-CoV-2 target nucleic acids are NOT DETECTED. The SARS-CoV-2 RNA is generally detectable in upper and lower  respiratory specimens during the acute phase of infection. The lowest  concentration of SARS-CoV-2 viral copies this assay can detect is 250  copies / mL. A negative result does not preclude SARS-CoV-2 infection  and should not be used as the sole basis for treatment or other  patient management decisions.  A negative result may occur with  improper specimen collection / handling,  submission of specimen other  than nasopharyngeal swab, presence of viral mutation(s) within the  areas targeted by this assay, and inadequate number of viral copies  (<250 copies / mL). A negative result must be combined with clinical  observations, patient history, and epidemiological information. If result is POSITIVE SARS-CoV-2 target nucleic acids are DETECTED. The SARS-CoV-2 RNA is generally detectable in upper and lower  respiratory specimens dur ing the acute phase of infection.  Positive  results are indicative of active infection with SARS-CoV-2.  Clinical  correlation with patient history and other diagnostic information is  necessary to determine patient infection status.  Positive results do  not rule out bacterial infection or co-infection with other viruses. If result is PRESUMPTIVE POSTIVE SARS-CoV-2 nucleic acids MAY BE PRESENT.   A presumptive positive result was obtained on the submitted specimen  and confirmed on repeat testing.  While 2019 novel coronavirus  (SARS-CoV-2) nucleic acids may be present in the submitted sample  additional confirmatory testing may be necessary  for epidemiological  and / or clinical management purposes  to differentiate between  SARS-CoV-2 and other Sarbecovirus currently known to infect humans.  If clinically indicated additional testing with an alternate test  methodology [phone]) is advised. The SARS-CoV-2 RNA is generally  detectable in upper and lower respiratory sp ecimens during the acute  phase of infection. The expected result is Negative. Fact Sheet for Patients:  StrictlyIdeas.no Fact Sheet for Healthcare Providers: BankingDealers.co.za This test is not yet approved or cleared by the [place] FDA and has been authorized for detection and/or diagnosis of SARS-CoV-2 by FDA under an Emergency Use Authorization (EUA).  This EUA will remain in effect (meaning this test can be  used) for the duration of the COVID-19 declaration under Section 564(b)(1) of the Act, 21 U.S.C. section 360bbb-3(b)(1), unless the authorization is terminated or revoked sooner. Performed at [facility], [place], [place] [address]   SARS CORONAVIRUS 2 (TAT 6-24 HRS) Nasopharyngeal Nasopharyngeal Swab     Status: None   Collection Time: 10/04/19  5:13 PM   Specimen: Nasopharyngeal Swab  Result Value Ref Range Status   SARS Coronavirus 2 NEGATIVE NEGATIVE Final    Comment: (NOTE) SARS-CoV-2 target nucleic acids are NOT DETECTED. The SARS-CoV-2 RNA is generally detectable in upper and lower respiratory specimens during the acute phase of infection. Negative results do not preclude SARS-CoV-2 infection, do not rule out co-infections with other pathogens, and should not be used as the sole basis for treatment or other patient management decisions. Negative results must be combined with clinical observations, patient history, and epidemiological information. The expected result is Negative. Fact Sheet for Patients: SugarRoll.be Fact Sheet for Healthcare Providers: https://www.woods-mathews.com/ This test is not yet approved or cleared by the [place] FDA and  has been authorized for detection and/or diagnosis of SARS-CoV-2 by FDA under an Emergency Use Authorization (EUA). This EUA will remain  in effect (meaning this test can be used) for the duration of the COVID-19 declaration under Section 56 4(b)(1) of the Act, 21 U.S.C. section 360bbb-3(b)(1), unless the authorization is terminated or revoked sooner. Performed at [facility], [place] [address] [place], [place] [address]   SARS CORONAVIRUS 2 (TAT 6-24 HRS) Nasopharyngeal Nasopharyngeal Swab     Status: None   Collection Time: 10/07/19  3:26 PM   Specimen: Nasopharyngeal Swab  Result Value Ref Range Status   SARS Coronavirus 2 NEGATIVE NEGATIVE Final     Comment: (NOTE) SARS-CoV-2 target nucleic acids are NOT DETECTED. The SARS-CoV-2 RNA is generally detectable in upper and lower respiratory specimens during the acute phase of infection. Negative results do not preclude SARS-CoV-2 infection, do not rule out co-infections with other pathogens, and should not be used as the sole basis for treatment or other patient management decisions. Negative results must be combined with clinical observations, patient history, and epidemiological information. The expected result is Negative. Fact Sheet for Patients: SugarRoll.be Fact Sheet for Healthcare Providers: https://www.woods-mathews.com/ This test is not yet approved or cleared by the [place] FDA and  has been authorized for detection and/or diagnosis of SARS-CoV-2 by FDA under an Emergency Use Authorization (EUA). This EUA will remain  in effect (meaning this test can be used) for the duration of the COVID-19 declaration under Section 56 4(b)(1) of the Act, 21 U.S.C. section 360bbb-3(b)(1), unless the authorization is terminated or revoked sooner. Performed at [facility], [place] [address], [place], [place] [address]  Labs: BNP (last 3 results) Recent Labs    09/05/19 1651 09/20/19 0526 10/01/19 1650  BNP 979.0* 1,516.0* [id number]*   Basic Metabolic Panel: Recent Labs  Lab 10/01/19 1650 10/02/19 0551 10/03/19 0639 10/04/19 1100  NA 136 136 138 133*  K 4.7 5.1 4.3 4.2  CL 95* 97* 99 95*  CO2 28 26 24 24   GLUCOSE 167* 153* 83 141*  BUN 31* 40* 30* 44*  CREATININE 4.63* 5.58* 4.44* 6.33*  CALCIUM 8.8* 8.7* 8.2* 7.8*  MG  --  2.3  --   --   PHOS  --  5.0* 3.3 3.5   Liver Function Tests: Recent Labs  Lab 10/01/19 1650 10/02/19 0551 10/03/19 0639 10/04/19 1100  AST 14*  --   --   --   ALT 9  --   --   --   ALKPHOS 59  --   --   --   BILITOT 0.7  --   --   --   PROT 6.4*  --   --   --   ALBUMIN 3.6 3.4* 3.3*  3.0*   Recent Labs  Lab 10/01/19 1650  LIPASE 22   No results for input(s): AMMONIA in the last 168 hours. CBC: Recent Labs  Lab 10/01/19 1650 10/02/19 0551 10/03/19 0639 10/04/19 1100  WBC 9.4 6.1 7.4 5.5  NEUTROABS 6.8  --   --   --   HGB 9.7* 7.8* 6.7* 8.1*  HCT 30.0* 24.0* 20.5* 23.5*  MCV 91.2 89.2 89.9 87.0  PLT 300 162 143* 163   Cardiac Enzymes: No results for input(s): CKTOTAL, CKMB, CKMBINDEX, TROPONINI in the last 168 hours. BNP: Invalid input(s): POCBNP CBG: Recent Labs  Lab 10/07/19 0833 10/07/19 1338 10/07/19 1650 10/07/19 2128 10/08/19 0835  GLUCAP 121* 74 185* 143* 102*   D-Dimer No results for input(s): DDIMER in the last 72 hours. Hgb A1c No results for input(s): HGBA1C in the last 72 hours. Lipid Profile No results for input(s): CHOL, HDL, LDLCALC, TRIG, CHOLHDL, LDLDIRECT in the last 72 hours. Thyroid function studies No results for input(s): TSH, T4TOTAL, T3FREE, THYROIDAB in the last 72 hours.  Invalid input(s): FREET3 Anemia work up No results for input(s): VITAMINB12, FOLATE, FERRITIN, TIBC, IRON, RETICCTPCT in the last 72 hours. Urinalysis    Component Value Date/Time   COLORURINE YELLOW (A) 11/28/2018 2146   APPEARANCEUR CLEAR (A) 11/28/2018 2146   LABSPEC 1.012 11/28/2018 2146   PHURINE 8.0 11/28/2018 2146   GLUCOSEU 50 (A) 11/28/2018 2146   HGBUR NEGATIVE 11/28/2018 2146   BILIRUBINUR NEGATIVE 11/28/2018 2146   KETONESUR 5 (A) 11/28/2018 2146   PROTEINUR >=300 (A) 11/28/2018 2146   NITRITE NEGATIVE 11/28/2018 2146   LEUKOCYTESUR NEGATIVE 11/28/2018 2146   Sepsis Labs Invalid input(s): PROCALCITONIN,  WBC,  LACTICIDVEN Microbiology Recent Results (from the past 240 hour(s))  SARS CORONAVIRUS 2 (TAT 6-24 HRS) Nasopharyngeal Nasopharyngeal Swab     Status: None   Collection Time: 10/01/19  4:50 PM   Specimen: Nasopharyngeal Swab  Result Value Ref Range Status   SARS Coronavirus 2 NEGATIVE NEGATIVE Final    Comment:  (NOTE) SARS-CoV-2 target nucleic acids are NOT DETECTED. The SARS-CoV-2 RNA is generally detectable in upper and lower respiratory specimens during the acute phase of infection. Negative results do not preclude SARS-CoV-2 infection, do not rule out co-infections with other pathogens, and should not be used as the sole basis for treatment or other patient management decisions. Negative results must be combined with clinical  observations, patient history, and epidemiological information. The expected result is Negative. Fact Sheet for Patients: SugarRoll.be Fact Sheet for Healthcare Providers: https://www.woods-mathews.com/ This test is not yet approved or cleared by the [place] FDA and  has been authorized for detection and/or diagnosis of SARS-CoV-2 by FDA under an Emergency Use Authorization (EUA). This EUA will remain  in effect (meaning this test can be used) for the duration of the COVID-19 declaration under Section 56 4(b)(1) of the Act, 21 U.S.C. section 360bbb-3(b)(1), unless the authorization is terminated or revoked sooner. Performed at [facility], [place] [address], [place], [place] [address]   SARS Coronavirus 2 by RT PCR (hospital order, performed in [facility] hospital lab) Nasopharyngeal Nasopharyngeal Swab     Status: None   Collection Time: 10/01/19 11:04 PM   Specimen: Nasopharyngeal Swab  Result Value Ref Range Status   SARS Coronavirus 2 NEGATIVE NEGATIVE Final    Comment: (NOTE) If result is NEGATIVE SARS-CoV-2 target nucleic acids are NOT DETECTED. The SARS-CoV-2 RNA is generally detectable in upper and lower  respiratory specimens during the acute phase of infection. The lowest  concentration of SARS-CoV-2 viral copies this assay can detect is 250  copies / mL. A negative result does not preclude SARS-CoV-2 infection  and should not be used as the sole basis for treatment or other  patient management  decisions.  A negative result may occur with  improper specimen collection / handling, submission of specimen other  than nasopharyngeal swab, presence of viral mutation(s) within the  areas targeted by this assay, and inadequate number of viral copies  (<250 copies / mL). A negative result must be combined with clinical  observations, patient history, and epidemiological information. If result is POSITIVE SARS-CoV-2 target nucleic acids are DETECTED. The SARS-CoV-2 RNA is generally detectable in upper and lower  respiratory specimens dur ing the acute phase of infection.  Positive  results are indicative of active infection with SARS-CoV-2.  Clinical  correlation with patient history and other diagnostic information is  necessary to determine patient infection status.  Positive results do  not rule out bacterial infection or co-infection with other viruses. If result is PRESUMPTIVE POSTIVE SARS-CoV-2 nucleic acids MAY BE PRESENT.   A presumptive positive result was obtained on the submitted specimen  and confirmed on repeat testing.  While 2019 novel coronavirus  (SARS-CoV-2) nucleic acids may be present in the submitted sample  additional confirmatory testing may be necessary for epidemiological  and / or clinical management purposes  to differentiate between  SARS-CoV-2 and other Sarbecovirus currently known to infect humans.  If clinically indicated additional testing with an alternate test  methodology [phone]) is advised. The SARS-CoV-2 RNA is generally  detectable in upper and lower respiratory sp ecimens during the acute  phase of infection. The expected result is Negative. Fact Sheet for Patients:  StrictlyIdeas.no Fact Sheet for Healthcare Providers: BankingDealers.co.za This test is not yet approved or cleared by the [place] FDA and has been authorized for detection and/or diagnosis of SARS-CoV-2 by FDA under an  Emergency Use Authorization (EUA).  This EUA will remain in effect (meaning this test can be used) for the duration of the COVID-19 declaration under Section 564(b)(1) of the Act, 21 U.S.C. section 360bbb-3(b)(1), unless the authorization is terminated or revoked sooner. Performed at [facility], [place], [place] [address]   SARS CORONAVIRUS 2 (TAT 6-24 HRS) Nasopharyngeal Nasopharyngeal Swab     Status: None  Collection Time: 10/04/19  5:13 PM   Specimen: Nasopharyngeal Swab  Result Value Ref Range Status   SARS Coronavirus 2 NEGATIVE NEGATIVE Final    Comment: (NOTE) SARS-CoV-2 target nucleic acids are NOT DETECTED. The SARS-CoV-2 RNA is generally detectable in upper and lower respiratory specimens during the acute phase of infection. Negative results do not preclude SARS-CoV-2 infection, do not rule out co-infections with other pathogens, and should not be used as the sole basis for treatment or other patient management decisions. Negative results must be combined with clinical observations, patient history, and epidemiological information. The expected result is Negative. Fact Sheet for Patients: SugarRoll.be Fact Sheet for Healthcare Providers: https://www.woods-mathews.com/ This test is not yet approved or cleared by the [place] FDA and  has been authorized for detection and/or diagnosis of SARS-CoV-2 by FDA under an Emergency Use Authorization (EUA). This EUA will remain  in effect (meaning this test can be used) for the duration of the COVID-19 declaration under Section 56 4(b)(1) of the Act, 21 U.S.C. section 360bbb-3(b)(1), unless the authorization is terminated or revoked sooner. Performed at [facility], [place] [address], [place], [place] [address]   SARS CORONAVIRUS 2 (TAT 6-24 HRS) Nasopharyngeal Nasopharyngeal Swab     Status: None   Collection Time: 10/07/19  3:26 PM   Specimen:  Nasopharyngeal Swab  Result Value Ref Range Status   SARS Coronavirus 2 NEGATIVE NEGATIVE Final    Comment: (NOTE) SARS-CoV-2 target nucleic acids are NOT DETECTED. The SARS-CoV-2 RNA is generally detectable in upper and lower respiratory specimens during the acute phase of infection. Negative results do not preclude SARS-CoV-2 infection, do not rule out co-infections with other pathogens, and should not be used as the sole basis for treatment or other patient management decisions. Negative results must be combined with clinical observations, patient history, and epidemiological information. The expected result is Negative. Fact Sheet for Patients: SugarRoll.be Fact Sheet for Healthcare Providers: https://www.woods-mathews.com/ This test is not yet approved or cleared by the [place] FDA and  has been authorized for detection and/or diagnosis of SARS-CoV-2 by FDA under an Emergency Use Authorization (EUA). This EUA will remain  in effect (meaning this test can be used) for the duration of the COVID-19 declaration under Section 56 4(b)(1) of the Act, 21 U.S.C. section 360bbb-3(b)(1), unless the authorization is terminated or revoked sooner. Performed at [facility], [place] [address] [place], [place] [address]      Time coordinating discharge: 25 minutes The  controlled substances registry was reviewed for this patient prior to filling the <5 days supply controlled substances script.      SIGNED:   Edwin Dada, MD  [facility] 10/08/2019, 10:12 AM

## 2019-10-08 NOTE — TOC Transition Note (Signed)
Transition of Care [facility]) - CM/SW Discharge Note   Patient Details  Name: Phyllis [name] MRN: [id number] Date of Birth: [birth year]  Transition of Care [facility]) CM/SW Contact:  Weston Anna, LCSW Phone Number: 10/08/2019, 10:06 AM   Clinical Narrative:     Patient set to discharge to [facility] for today- please call report to [phone]. Patient aware of discharge. EMS will need to provide transportation.   Final next level of care: Skilled Nursing Facility Barriers to Discharge: No Barriers Identified   Patient Goals and CMS Choice   CMS Medicare.gov Compare Post Acute Care list provided to:: Patient Choice offered to / list presented to : Patient  Discharge Placement              Patient chooses bed at: [facility] Patient to be transferred to facility by: [facility] Name of family member notified: patient notified Patient and family notified of of transfer: 10/08/19  Discharge Plan and Services In-house Referral: Hospice / [place] Acute Care Choice: [place]          DME Arranged: N/A         HH Arranged: NA          Social Determinants of Health (SDOH) Interventions     Readmission Risk Interventions Readmission Risk Prevention Plan 09/26/2019 05/07/2019  Transportation Screening Complete Complete  Medication Review Press photographer) Complete Complete  PCP or Specialist appointment within 3-5 days of discharge Complete Complete  HRI or Home Care Consult Complete Complete  SW Recovery Care/Counseling Consult Complete -  Palliative Care Screening Not Applicable Not [place] Not Applicable Not Applicable  Some recent data might be hidden

## 2019-10-11 ENCOUNTER — Inpatient Hospital Stay
Admission: EM | Admit: 2019-10-11 | Discharge: 2019-10-14 | DRG: 291 | Disposition: A | Discharge status: Other Acute Inpatient Hospital | Payer: Medicare Other | Source: Skilled Nursing Facility | Attending: Internal Medicine | Admitting: Internal Medicine

## 2019-10-11 ENCOUNTER — Emergency Department: Payer: Medicare Other

## 2019-10-11 ENCOUNTER — Other Ambulatory Visit: Payer: Self-pay

## 2019-10-11 DIAGNOSIS — R570 Cardiogenic shock: Secondary | ICD-10-CM | POA: Diagnosis present

## 2019-10-11 DIAGNOSIS — Z8673 Personal history of transient ischemic attack (TIA), and cerebral infarction without residual deficits: Secondary | ICD-10-CM | POA: Diagnosis not present

## 2019-10-11 DIAGNOSIS — J449 Chronic obstructive pulmonary disease, unspecified: Secondary | ICD-10-CM | POA: Diagnosis present

## 2019-10-11 DIAGNOSIS — Z7902 Long term (current) use of antithrombotics/antiplatelets: Secondary | ICD-10-CM | POA: Diagnosis not present

## 2019-10-11 DIAGNOSIS — G4733 Obstructive sleep apnea (adult) (pediatric): Secondary | ICD-10-CM | POA: Diagnosis present

## 2019-10-11 DIAGNOSIS — G8929 Other chronic pain: Secondary | ICD-10-CM | POA: Diagnosis present

## 2019-10-11 DIAGNOSIS — Z9889 Other specified postprocedural states: Secondary | ICD-10-CM | POA: Diagnosis not present

## 2019-10-11 DIAGNOSIS — Z992 Dependence on renal dialysis: Secondary | ICD-10-CM | POA: Diagnosis not present

## 2019-10-11 DIAGNOSIS — I469 Cardiac arrest, cause unspecified: Secondary | ICD-10-CM | POA: Diagnosis not present

## 2019-10-11 DIAGNOSIS — I5032 Chronic diastolic (congestive) heart failure: Secondary | ICD-10-CM | POA: Diagnosis present

## 2019-10-11 DIAGNOSIS — J9601 Acute respiratory failure with hypoxia: Secondary | ICD-10-CM

## 2019-10-11 DIAGNOSIS — I700 Atherosclerosis of aorta: Secondary | ICD-10-CM | POA: Diagnosis present

## 2019-10-11 DIAGNOSIS — I132 Hypertensive heart and chronic kidney disease with heart failure and with stage 5 chronic kidney disease, or end stage renal disease: Principal | ICD-10-CM | POA: Diagnosis present

## 2019-10-11 DIAGNOSIS — N2581 Secondary hyperparathyroidism of renal origin: Secondary | ICD-10-CM | POA: Diagnosis present

## 2019-10-11 DIAGNOSIS — Z79899 Other long term (current) drug therapy: Secondary | ICD-10-CM

## 2019-10-11 DIAGNOSIS — Z1159 Encounter for screening for other viral diseases: Secondary | ICD-10-CM | POA: Diagnosis not present

## 2019-10-11 DIAGNOSIS — Z881 Allergy status to other antibiotic agents status: Secondary | ICD-10-CM | POA: Diagnosis not present

## 2019-10-11 DIAGNOSIS — Z7951 Long term (current) use of inhaled steroids: Secondary | ICD-10-CM

## 2019-10-11 DIAGNOSIS — M549 Dorsalgia, unspecified: Secondary | ICD-10-CM | POA: Diagnosis present

## 2019-10-11 DIAGNOSIS — E871 Hypo-osmolality and hyponatremia: Secondary | ICD-10-CM | POA: Diagnosis not present

## 2019-10-11 DIAGNOSIS — J810 Acute pulmonary edema: Secondary | ICD-10-CM | POA: Diagnosis not present

## 2019-10-11 DIAGNOSIS — M109 Gout, unspecified: Secondary | ICD-10-CM | POA: Diagnosis present

## 2019-10-11 DIAGNOSIS — N186 End stage renal disease: Secondary | ICD-10-CM | POA: Diagnosis not present

## 2019-10-11 DIAGNOSIS — Z8674 Personal history of sudden cardiac arrest: Secondary | ICD-10-CM | POA: Diagnosis not present

## 2019-10-11 DIAGNOSIS — I350 Nonrheumatic aortic (valve) stenosis: Secondary | ICD-10-CM | POA: Diagnosis not present

## 2019-10-11 DIAGNOSIS — K219 Gastro-esophageal reflux disease without esophagitis: Secondary | ICD-10-CM | POA: Diagnosis present

## 2019-10-11 DIAGNOSIS — E785 Hyperlipidemia, unspecified: Secondary | ICD-10-CM | POA: Diagnosis present

## 2019-10-11 DIAGNOSIS — Z8349 Family history of other endocrine, nutritional and metabolic diseases: Secondary | ICD-10-CM

## 2019-10-11 DIAGNOSIS — U071 COVID-19: Secondary | ICD-10-CM | POA: Diagnosis present

## 2019-10-11 DIAGNOSIS — E1122 Type 2 diabetes mellitus with diabetic chronic kidney disease: Secondary | ICD-10-CM | POA: Diagnosis present

## 2019-10-11 DIAGNOSIS — A419 Sepsis, unspecified organism: Secondary | ICD-10-CM

## 2019-10-11 DIAGNOSIS — J9691 Respiratory failure, unspecified with hypoxia: Secondary | ICD-10-CM | POA: Diagnosis present

## 2019-10-11 DIAGNOSIS — D649 Anemia, unspecified: Secondary | ICD-10-CM | POA: Diagnosis not present

## 2019-10-11 DIAGNOSIS — Z8249 Family history of ischemic heart disease and other diseases of the circulatory system: Secondary | ICD-10-CM

## 2019-10-11 DIAGNOSIS — J9602 Acute respiratory failure with hypercapnia: Secondary | ICD-10-CM | POA: Diagnosis present

## 2019-10-11 DIAGNOSIS — I5033 Acute on chronic diastolic (congestive) heart failure: Secondary | ICD-10-CM | POA: Diagnosis not present

## 2019-10-11 DIAGNOSIS — D631 Anemia in chronic kidney disease: Secondary | ICD-10-CM | POA: Diagnosis not present

## 2019-10-11 DIAGNOSIS — R011 Cardiac murmur, unspecified: Secondary | ICD-10-CM | POA: Diagnosis not present

## 2019-10-11 DIAGNOSIS — M898X9 Other specified disorders of bone, unspecified site: Secondary | ICD-10-CM | POA: Diagnosis present

## 2019-10-11 HISTORY — DX: Nonrheumatic aortic (valve) stenosis: I35.0

## 2019-10-11 LAB — BLOOD GAS, ARTERIAL
Acid-Base Excess: 2.3 mmol/L — ABNORMAL HIGH (ref 0.0–2.0)
Bicarbonate: 26.5 mmol/L (ref 20.0–28.0)
FIO2: 40
MECHVT: 500 mL
O2 Saturation: 99.2 %
PEEP: 5 cmH2O
Patient temperature: 37
RATE: 18 resp/min
pCO2 arterial: 39 mmHg (ref 32.0–48.0)
pH, Arterial: 7.44 (ref 7.350–7.450)
pO2, Arterial: 140 mmHg — ABNORMAL HIGH (ref 83.0–108.0)

## 2019-10-11 LAB — COMPREHENSIVE METABOLIC PANEL
ALT: 17 U/L (ref 0–44)
AST: 37 U/L (ref 15–41)
Albumin: 3.6 g/dL (ref 3.5–5.0)
Alkaline Phosphatase: 63 U/L (ref 38–126)
Anion gap: 16 — ABNORMAL HIGH (ref 5–15)
BUN: 28 mg/dL — ABNORMAL HIGH (ref 8–23)
CO2: 22 mmol/L (ref 22–32)
Calcium: 8.5 mg/dL — ABNORMAL LOW (ref 8.9–10.3)
Chloride: 96 mmol/L — ABNORMAL LOW (ref 98–111)
Creatinine, Ser: 5.56 mg/dL — ABNORMAL HIGH (ref 0.44–1.00)
GFR calc Af Amer: 8 mL/min — ABNORMAL LOW (ref >60)
GFR calc non Af Amer: 7 mL/min — ABNORMAL LOW (ref >60)
Glucose, Bld: 222 mg/dL — ABNORMAL HIGH (ref 70–99)
Potassium: 5 mmol/L (ref 3.5–5.1)
Sodium: 134 mmol/L — ABNORMAL LOW (ref 135–145)
Total Bilirubin: 0.8 mg/dL (ref 0.3–1.2)
Total Protein: 6 g/dL — ABNORMAL LOW (ref 6.5–8.1)

## 2019-10-11 LAB — CBC WITH DIFFERENTIAL/PLATELET
Abs Immature Granulocytes: 0.38 10*3/uL — ABNORMAL HIGH (ref 0.00–0.07)
Basophils Absolute: 0.1 10*3/uL (ref 0.0–0.1)
Basophils Relative: 0 %
Eosinophils Absolute: 0.3 10*3/uL (ref 0.0–0.5)
Eosinophils Relative: 3 %
HCT: 32.8 % — ABNORMAL LOW (ref 36.0–46.0)
Hemoglobin: 10.5 g/dL — ABNORMAL LOW (ref 12.0–15.0)
Immature Granulocytes: 3 %
Lymphocytes Relative: 20 %
Lymphs Abs: 2.4 10*3/uL (ref 0.7–4.0)
MCH: 30.3 pg (ref 26.0–34.0)
MCHC: 32 g/dL (ref 30.0–36.0)
MCV: 94.8 fL (ref 80.0–100.0)
Monocytes Absolute: 0.8 10*3/uL (ref 0.1–1.0)
Monocytes Relative: 7 %
Neutro Abs: 8.4 10*3/uL — ABNORMAL HIGH (ref 1.7–7.7)
Neutrophils Relative %: 67 %
Platelets: 272 10*3/uL (ref 150–400)
RBC: 3.46 MIL/uL — ABNORMAL LOW (ref 3.87–5.11)
RDW: 15.9 % — ABNORMAL HIGH (ref 11.5–15.5)
WBC: 12.4 10*3/uL — ABNORMAL HIGH (ref 4.0–10.5)
nRBC: 0 % (ref 0.0–0.2)

## 2019-10-11 LAB — LACTIC ACID, PLASMA: Lactic Acid, Venous: 4.8 mmol/L (ref 0.5–1.9)

## 2019-10-11 LAB — POC SARS CORONAVIRUS 2 AG: SARS Coronavirus 2 Ag: POSITIVE — AB

## 2019-10-11 LAB — TROPONIN I (HIGH SENSITIVITY): Troponin I (High Sensitivity): 64 ng/L — ABNORMAL HIGH (ref <18)

## 2019-10-11 LAB — GLUCOSE, CAPILLARY
Glucose-Capillary: 137 mg/dL — ABNORMAL HIGH (ref 70–99)
Glucose-Capillary: 148 mg/dL — ABNORMAL HIGH (ref 70–99)

## 2019-10-11 LAB — BRAIN NATRIURETIC PEPTIDE: B Natriuretic Peptide: 2087 pg/mL — ABNORMAL HIGH (ref 0.0–100.0)

## 2019-10-11 MED ORDER — LIDOCAINE-PRILOCAINE 2.5-2.5 % EX CREA
1.0000 "application " | TOPICAL_CREAM | CUTANEOUS | Status: DC | PRN
  Filled 2019-10-11: qty 5

## 2019-10-11 MED ORDER — IPRATROPIUM-ALBUTEROL 0.5-2.5 (3) MG/3ML IN SOLN: 3.0000 mL | Freq: Four times a day (QID) | RESPIRATORY_TRACT | Status: DC | PRN

## 2019-10-11 MED ORDER — HEPARIN SODIUM (PORCINE) 1000 UNIT/ML DIALYSIS: 1000.0000 [IU] | INTRAMUSCULAR | Status: DC | PRN

## 2019-10-11 MED ORDER — PIPERACILLIN-TAZOBACTAM 3.375 G IVPB 30 MIN: 3.3750 g | Freq: Once | INTRAVENOUS | Status: DC

## 2019-10-11 MED ORDER — FAMOTIDINE IN NACL 20-0.9 MG/50ML-% IV SOLN
20.0000 mg | Freq: Every day | INTRAVENOUS | Status: DC
  Administered 2019-10-11: 20 mg via INTRAVENOUS
  Filled 2019-10-11: qty 50

## 2019-10-11 MED ORDER — DIPHENHYDRAMINE HCL 50 MG/ML IJ SOLN: 25.0000 mg | INTRAMUSCULAR | Status: DC | PRN

## 2019-10-11 MED ORDER — SODIUM CHLORIDE 0.9 % IV SOLN: 100.0000 mL | INTRAVENOUS | Status: DC | PRN

## 2019-10-11 MED ORDER — SUCCINYLCHOLINE CHLORIDE 20 MG/ML IJ SOLN
200.0000 mg | Freq: Once | INTRAMUSCULAR | Status: AC
  Administered 2019-10-11: 200 mg via INTRAVENOUS

## 2019-10-11 MED ORDER — CHLORHEXIDINE GLUCONATE 0.12% ORAL RINSE (MEDLINE KIT)
15.0000 mL | Freq: Two times a day (BID) | OROMUCOSAL | Status: DC
  Administered 2019-10-11 – 2019-10-12 (×2): 15 mL via OROMUCOSAL

## 2019-10-11 MED ORDER — HEPARIN SODIUM (PORCINE) 5000 UNIT/ML IJ SOLN
5000.0000 [IU] | Freq: Three times a day (TID) | INTRAMUSCULAR | Status: DC
  Administered 2019-10-11 – 2019-10-14 (×5): 5000 [IU] via SUBCUTANEOUS
  Filled 2019-10-11 (×5): qty 1

## 2019-10-11 MED ORDER — ORAL CARE MOUTH RINSE: 15.0000 mL | Freq: Two times a day (BID) | OROMUCOSAL | Status: DC

## 2019-10-11 MED ORDER — LIDOCAINE HCL (PF) 1 % IJ SOLN
5.0000 mL | INTRAMUSCULAR | Status: DC | PRN
  Filled 2019-10-11: qty 5

## 2019-10-11 MED ORDER — POLYVINYL ALCOHOL 1.4 % OP SOLN
1.0000 [drp] | Freq: Four times a day (QID) | OPHTHALMIC | Status: DC | PRN
  Filled 2019-10-11: qty 15

## 2019-10-11 MED ORDER — DEXTROSE 5 % IV SOLN
INTRAVENOUS | Status: DC
  Administered 2019-10-11: 15:00:00 via INTRAVENOUS

## 2019-10-11 MED ORDER — SODIUM CHLORIDE 0.9 % IV BOLUS
1000.0000 mL | Freq: Once | INTRAVENOUS | Status: AC
  Administered 2019-10-11: 1000 mL via INTRAVENOUS

## 2019-10-11 MED ORDER — PROPOFOL 1000 MG/100ML IV EMUL
5.0000 ug/kg/min | INTRAVENOUS | Status: DC
  Administered 2019-10-11: 60 ug/kg/min via INTRAVENOUS
  Administered 2019-10-11: 20 ug/kg/min via INTRAVENOUS
  Administered 2019-10-11: 50 ug/kg/min via INTRAVENOUS
  Filled 2019-10-11 (×2): qty 100

## 2019-10-11 MED ORDER — ORAL CARE MOUTH RINSE: 15.0000 mL | OROMUCOSAL | Status: DC

## 2019-10-11 MED ORDER — GLYCOPYRROLATE 1 MG PO TABS
1.0000 mg | ORAL_TABLET | ORAL | Status: DC | PRN
  Filled 2019-10-11: qty 1

## 2019-10-11 MED ORDER — SODIUM CHLORIDE 0.9 % IV SOLN
INTRAVENOUS | Status: DC | PRN
  Administered 2019-10-11: 1000 mL via INTRAVENOUS

## 2019-10-11 MED ORDER — ALTEPLASE 2 MG IJ SOLR: 2.0000 mg | Freq: Once | INTRAMUSCULAR | Status: DC | PRN

## 2019-10-11 MED ORDER — ACETAMINOPHEN 325 MG PO TABS: 650.0000 mg | ORAL_TABLET | ORAL | Status: DC | PRN

## 2019-10-11 MED ORDER — GLYCOPYRROLATE 0.2 MG/ML IJ SOLN: 0.2000 mg | INTRAMUSCULAR | Status: DC | PRN

## 2019-10-11 MED ORDER — CHLORHEXIDINE GLUCONATE CLOTH 2 % EX PADS
6.0000 | MEDICATED_PAD | Freq: Every day | CUTANEOUS | Status: DC
  Administered 2019-10-12 – 2019-10-13 (×2): 6 via TOPICAL

## 2019-10-11 MED ORDER — MORPHINE 100MG IN NS 100ML (1MG/ML) PREMIX INFUSION
1.0000 mg/h | INTRAVENOUS | Status: DC
  Administered 2019-10-11: 5 mg/h via INTRAVENOUS
  Filled 2019-10-11: qty 100

## 2019-10-11 MED ORDER — PENTAFLUOROPROP-TETRAFLUOROETH EX AERO
1.0000 "application " | INHALATION_SPRAY | CUTANEOUS | Status: DC | PRN
  Filled 2019-10-11: qty 30

## 2019-10-11 MED ORDER — ACETAMINOPHEN 650 MG RE SUPP: 650.0000 mg | Freq: Four times a day (QID) | RECTAL | Status: DC | PRN

## 2019-10-11 MED ORDER — ACETAMINOPHEN 325 MG PO TABS: 650.0000 mg | ORAL_TABLET | Freq: Four times a day (QID) | ORAL | Status: DC | PRN

## 2019-10-11 MED ORDER — ETOMIDATE 2 MG/ML IV SOLN
20.0000 mg | Freq: Once | INTRAVENOUS | Status: AC
  Administered 2019-10-11: 20 mg via INTRAVENOUS

## 2019-10-11 MED ORDER — VANCOMYCIN HCL IN DEXTROSE 1-5 GM/200ML-% IV SOLN: 1000.0000 mg | Freq: Once | INTRAVENOUS | Status: DC

## 2019-10-11 MED ORDER — EPOETIN ALFA 2000 UNIT/ML IJ SOLN
2000.0000 [IU] | INTRAMUSCULAR | Status: DC
  Filled 2019-10-11: qty 1

## 2019-10-11 NOTE — Progress Notes (Signed)
This note also relates to the following rows which could not be included: Pulse Rate - Cannot attach notes to unvalidated device data Resp - Cannot attach notes to unvalidated device data BP - Cannot attach notes to unvalidated device data SpO2 - Cannot attach notes to unvalidated device data    10/11/19 2130  Vital Signs  Temp 99.7 F (37.6 C)  Temp Source Rectal  Pulse Rate Source Monitor  During Hemodialysis Assessment  Blood Flow Rate (mL/min) 300 mL/min  Arterial Pressure (mmHg) -150 mmHg  Venous Pressure (mmHg) 110 mmHg  Transmembrane Pressure (mmHg) 70 mmHg  Ultrafiltration Rate (mL/min) 570 mL/min  Dialysate Flow Rate (mL/min) 600 ml/min  Conductivity: Machine  13.9  HD Safety Checks Performed Yes  Dialysis Fluid Bolus Normal Saline  Bolus Amount (mL) 250 mL  Intra-Hemodialysis Comments Progressing as prescribed;Tolerated well;Tx completed  Post-Hemodialysis Assessment  Rinseback Volume (mL) 250 mL  Dialyzer Clearance Clear  Duration of HD Treatment -hour(s) 2.5 hour(s)  Hemodialysis Intake (mL) 500 mL  UF Total -Machine (mL) 1500 mL  Net UF (mL) 1000 mL  Tolerated HD Treatment Yes  AVG/AVF Arterial Site Held (minutes) 8 minutes  AVG/AVF Venous Site Held (minutes) 8 minutes  HD TX COMPLETED PT TOLERATED WELL VITALS STABLE AVF +/+ UFG 1L

## 2019-10-11 NOTE — Progress Notes (Signed)
clinical status relayed to [name] and [name] Boone(who was in process of getting HCPOA)  Updated and notified of patients medical condition-  Progressive multiorgan failure with very low chance of meaningful recovery.  Patient is in dying  Process.  Family understands the situation.  They have consented and agreed to DNR status.    They are satisfied with Plan of action and management. All questions answered  Corrin Parker, M.D.  [facility]  Medical Director [place] Director [facility] Cardio-Pulmonary Department

## 2019-10-11 NOTE — Plan of Care (Signed)

## 2019-10-11 NOTE — Progress Notes (Signed)
This note also relates to the following rows which could not be included: Pulse Rate - Cannot attach notes to unvalidated device data Resp - Cannot attach notes to unvalidated device data SpO2 - Cannot attach notes to unvalidated device data    10/11/19 1845  Vital Signs  Temp 99.9 F (37.7 C)  Temp Source Rectal  Pulse Rate Source Monitor  BP (!) 154/64  Pain Assessment  Pain Scale 0-10  Pain Score 0  Time-Out for Hemodialysis  What Procedure? HEMODIALYSIS  Pt Identifiers(min of two) First/Last Name;MRN/Account#  Correct Site? Yes  Correct Side? Yes  Correct Procedure? Yes  Consents Verified? Yes  Rad Studies Available? N/A  Safety Precautions Reviewed? Yes  Engineer, civil (consulting) Number 5  Station Number 6  UF/Alarm Test Passed  Conductivity: Meter 13.8  Conductivity: Machine  13.8  pH 7.4  Reverse Osmosis 1  Normal Saline Lot Number [id number]  Dialyzer Lot Number [id number]  Disposable Set Lot Number [id number]  Dialysate Acid Bath Lot Number [id number]  Dialysate HCO3 Bath Lot Number [id number]  Machine Temperature 98.6 F (37 C)  Musician and Audible Yes  Blood Lines Intact and Secured Yes  Pre Treatment Patient Checks  Vascular access used during treatment Fistula  Patient is receiving dialysis in a chair  (BED)  Hepatitis B Surface Antigen Results Negative  Date Hepatitis B Surface Antigen Drawn 09/06/19  Isolation Initiated Yes (COVID PRECAUTIONS)  Hepatitis B Surface Antibody 83.7  Date Hepatitis B Surface Antibody Drawn 11/29/18  Hemodialysis Consent Verified Yes  Hemodialysis Standing Orders Initiated Yes  ECG (Telemetry) Monitor On Yes  Prime Ordered Normal Saline  Length of  DialysisTreatment -hour(s) 2.5 Hour(s) (COVID PRECAUTIONS)  Dialyzer Elisio 17H NR  Dialysate 2K;2.5 Ca  Dialysate Flow Ordered 600  Blood Flow Rate Ordered 300 mL/min  Ultrafiltration Goal 1000 Liters  Dialysis Blood Pressure Support Ordered Normal Saline  PT STABLE FOR HD  TX VITALS STABLE ALERT AND ORIENTED AVF +/+ UFG 1L TX 2.5HRS DUE TO COVID PRECAUTIONS PER MD

## 2019-10-11 NOTE — Progress Notes (Addendum)
Shift summary:  - Patient admitted from ED from [facility]. - Intubated and sedated, purposefully reaching for ETT.  - Not following commands.  - Airborne/Contact Iso, positive for antigens for COVID.   - 1455 hrs: Patient extubated pursuant to comfort care orders. Patient is awake and talking. NAD. Morphine stopped. SPO2 stable on 2L [place]. Dr. Mortimer Fries notified.

## 2019-10-11 NOTE — Progress Notes (Signed)
   Progressive multiorgan failure with very low chance of meaningful recovery.  Patient is in dying  Process.  Friends and family understands the situation.  They have consented and agreed to DNR/DNI and would like to proceed with Comfort care measures.   Family are satisfied with Plan of action and management. All questions answered  Corrin Parker, M.D.  [facility]  Medical Director [place] Director [facility] Cardio-Pulmonary Department

## 2019-10-11 NOTE — ED Provider Notes (Signed)
Patient is remained stable on the ventilator normotensive in the ED.  Currently resting, is showing some purposeful movement on ventilator   Delman Kitten, MD 10/11/19 [phone]

## 2019-10-11 NOTE — ED Triage Notes (Addendum)
Patient coming [facility] from [facility] for respiratory distress and unresponsiveness. Patient was given a neb treatment by staff, later checked on and found unresponsive and pulseless. CPR performed by facility staff. Patient had HR of 120 at EMS arrival with gurgling respirations, oxygen saturation of 74, CO2 in the 30s, BP of 140/78. Patient placed on nonrebreather and oxygen climbed 90s. 21 gauge IV placed by EMS in left [facility] in transport (dialysis fistula also in left arm). Patient given 125 of solumedrol given in transport. 1" nitro placed on chest.

## 2019-10-11 NOTE — Progress Notes (Signed)
The plan was to make patient comfort care, After extubation, patient was more alert and awake. Morphine drip was held. Will plan to continue medical care and management.  Remains DNR/DNI

## 2019-10-11 NOTE — Procedures (Signed)
Extubation Procedure Note  Patient Details:   Name: Phyllis [name] DOB: Mar 25, 1946 MRN: [id number]   Airway Documentation:  Airway 7.5 mm (Active)  Secured at (cm) 21 cm 10/11/19 1352  Measured From Lips 10/11/19 1352  Secured Location Right 10/11/19 1352  Secured By [employer] 10/11/19 1352  Tube Holder Repositioned Yes 10/11/19 1352  Cuff Pressure (cm H2O) 24 cm H2O 10/11/19 1352  Site Condition Dry 10/11/19 1352     Evaluation  O2 sats: stable throughout Complications: No apparent complications Patient did tolerate procedure well. Bilateral Breath Sounds: Diminished, Clear   Yes.  Extubated to comfort care per CCM order.  Patient conscious with unlabored respirations post procedure.  Placed on [place] by RN for comfort.  Rayne Du Mikella Linsley 10/11/2019, 2:57 PM

## 2019-10-11 NOTE — ED Provider Notes (Signed)
[facility] Emergency Department Provider Note   ____________________________________________   First MD Initiated Contact with Patient 10/11/19 [phone]     (approximate)  I have reviewed the triage vital signs and the nursing notes.   HISTORY  Chief Complaint Respiratory Distress  Level V caveat: Limited by respiratory failure  HPI Phyllis [name] is a 73 y.o. female well-known to the emergency department with a history of ESRD on HD T/Th/Sat, COPD, diabetes sent from [facility] for respiratory failure.  She is due for dialysis this morning.  Staff noted patient was struggling to breathe and had gurgling respirations.  They went to get an albuterol nebulizer for the patient and when they returned patient was unresponsive and pulseless per the report.  CPR was initiated.  Upon EMS arrival, patient had pulses and had agonal respirations.  Arrives to the ED unresponsive, being bagged with gurgling respirations.       Past Medical History:  Diagnosis Date   (HFpEF) heart failure with preserved ejection fraction ([place])    a. TTE 01/2014: nl LV sys fxn, no valvular abnormalities; b. TTE 11/16: nl EF, mild LVH;  c. 04/2019 Echo: EF 60-65%. DD. Nl RV fxn. Mod AS. 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   Allergy    Anemia of chronic disease    Anxiety    Aortic atherosclerosis (HCC)    Asthma    Chronic back pain    COPD (chronic obstructive pulmonary disease) ([place])    Diabetes mellitus with complication ([place])    ESRD on hemodialysis ([place])    a. Tues/Sat; b. 2/2 small kidneys   Essential hypertension    Fistula    lower left arm   GERD (gastroesophageal reflux disease)    Gout    History of exercise stress test    a. 01/2014: no evidence of ischemia; b. Lexiscan 08/2015: no sig ischemia, severe GI uptake artifact, low risk; c. CPET @ [facility] 09/2016: exercised 3 min 12 sec on bike without incline, 2.28 METs, VO2 of  8.1, 48% of predicted, indicating mod to sev functional impairment, evidence of blunted HR, stroke volume, and BP augmentation as well as ventilation-perfusion mismatch with exercise   HLD (hyperlipidemia)    Moderate aortic stenosis    a. 04/2019 Echo: Mod AS.   Non-obstructive Carotid arterial disease ([place])    a. 12/2017: <50% bilat ICA dzs.   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    right chest   Sleep apnea     Patient Active Problem List   Diagnosis Date Noted   Respiratory failure with hypoxia ([place]) 10/11/2019   Acute on chronic anemia    Acute on chronic respiratory failure with hypoxia ([place]) 10/01/2019   Aortic valve stenosis    Acute respiratory failure with hypoxemia ([place]) 09/20/2019   COPD with acute bronchitis ([place]) 09/05/2019   Congestive heart failure ([place]) 05/24/2019   Hyperkalemia 11/29/2018   Chronic diastolic heart failure ([place]) 11/02/2018   HTN (hypertension) 11/02/2018   Uncontrolled hypertension 10/21/2018   Pressure injury of skin 03/16/2018   CVA (cerebral vascular accident) ([place]) 01/16/2018   Aortic atherosclerosis ([place]) 12/11/2017   Chest pain 09/18/2017   Hyperlipidemia [id number]   Complication from renal dialysis device 04/12/2015   SOB (shortness of breath) 02/01/2015   End stage renal disease on dialysis ([place]) 02/01/2015   Type 2 diabetes mellitus with other specified complication ([place]) [id number]   Asthma 02/01/2015   Acute on chronic  diastolic CHF (congestive heart failure) ([place]) 02/01/2015    Past Surgical History:  Procedure Laterality Date   carpel tunnel     GALLBLADDER SURGERY     PERIPHERAL VASCULAR CATHETERIZATION N/A 04/12/2015   Procedure: A/V Shuntogram/Fistulagram;  Surgeon: Algernon Huxley, MD;  Location: [facility];  Service: Cardiovascular;  Laterality: N/A;   PERIPHERAL VASCULAR CATHETERIZATION N/A 04/12/2015   Procedure: A/V Shunt Intervention;  Surgeon: Algernon Huxley, MD;  Location: [facility];  Service: Cardiovascular;  Laterality: N/A;   PERIPHERAL VASCULAR CATHETERIZATION N/A 06/09/2015   Procedure: Dialysis/Perma Catheter Removal;  Surgeon: Katha Cabal, MD;  Location: [place];  Service: Cardiovascular;  Laterality: N/A;    Prior to Admission medications   Medication Sig Start Date End Date Taking? Authorizing Provider  acetaminophen (TYLENOL) 500 MG tablet Take 1 tablet (500 mg total) by mouth every 6 (six) hours as needed for mild pain or fever. 09/29/19   Danford, Suann Larry, MD  albuterol (PROVENTIL HFA;VENTOLIN HFA) 108 (90 Base) MCG/ACT inhaler Inhale 2 puffs into the lungs every 6 (six) hours as needed for wheezing or shortness of breath.    [provider]  amLODipine (NORVASC) 10 MG tablet Take 10 mg by mouth daily.  12/29/13   [provider]  calcium acetate (PHOSLO) 667 MG capsule Take 1,334 mg by mouth 3 (three) times daily with meals.     [provider]  carvedilol (COREG) 12.5 MG tablet Take 12.5 mg by mouth 2 (two) times daily with a meal.  01/09/14   [provider]  cetirizine (ZYRTEC) 10 MG tablet Take 10 mg by mouth daily as needed for allergies.     [provider]  clopidogrel (PLAVIX) 75 MG tablet Take 1 tablet (75 mg total) by mouth daily. 01/18/18   Epifanio Lesches, MD  Fluticasone-Salmeterol (ADVAIR) 250-50 MCG/DOSE AEPB Inhale 1 puff into the lungs 2 (two) times daily as needed (for shortness of breath). 09/07/19   Henreitta Leber, MD  hydrALAZINE (APRESOLINE) 50 MG tablet Take 1 tablet (50 mg total) by mouth every 8 (eight) hours. 09/29/19   Danford, Suann Larry, MD  HYDROcodone-acetaminophen (NORCO) 7.5-325 MG tablet Take 1 tablet by mouth every 6 (six) hours as needed (pain). 10/08/19   Danford, Suann Larry, MD  lidocaine-prilocaine (EMLA) cream Apply 1 application topically as needed (prior to treatment).     [provider]  losartan (COZAAR) 50 MG tablet  Take 50 mg by mouth daily. 03/18/19   [provider]  omeprazole (PRILOSEC) 20 MG capsule Take 1 capsule (20 mg total) by mouth 2 (two) times daily before a meal. 09/07/19   Sainani, Belia Heman, MD  ondansetron (ZOFRAN) 4 MG tablet Take 4 mg by mouth every 8 (eight) hours as needed. 05/20/19   [provider]  oxybutynin (DITROPAN XL) 15 MG 24 hr tablet Take 15 mg by mouth daily. 12/24/17   [provider]  pravastatin (PRAVACHOL) 40 MG tablet Take 40 mg by mouth at bedtime.     [provider]  scopolamine (TRANSDERM-SCOP) 1 MG/3DAYS Place 1 patch onto the skin every 3 (three) days.    [provider]  topiramate (TOPAMAX) 25 MG tablet Take 1 tablet (25 mg total) by mouth as needed (for headaches). 09/07/19   Henreitta Leber, MD  torsemide (DEMADEX) 20 MG tablet Take 2 tablets (40 mg total) by mouth every Monday,Wednesday,Friday, and Sunday at 6 PM. 10/01/19   Danford,  Suann Larry, MD    Allergies Enalapril maleate; Nitrofurantoin; Sulfamethoxazole-trimethoprim; 2,4-d dimethylamine (amisol); Baclofen; Neosporin [neomycin-bacitracin zn-polymyx]; Quinine; Ultram [tramadol]; Zocor [simvastatin]; Bactrim [sulfamethoxazole-trimethoprim]; Levodopa; Macrodantin [nitrofurantoin macrocrystal]; and Quinine derivatives  Family History  Problem Relation Age of Onset   Hypertension Mother    Hyperlipidemia Mother    Heart disease Father    Heart attack Father 23   Hypertension Father    Hyperlipidemia Father    Heart disease Brother        CABG    Heart attack Brother    Breast cancer Neg Hx     Social History Social History   Tobacco Use   Smoking status: Never Smoker   Smokeless tobacco: Never Used  Substance Use Topics   Alcohol use: No   Drug use: No    Review of Systems  Constitutional: No fever/chills Eyes: No visual changes. ENT: No sore throat. Cardiovascular: Denies chest pain. Respiratory: Positive for shortness of  breath. Gastrointestinal: No abdominal pain.  No nausea, no vomiting.  No diarrhea.  No constipation. Genitourinary: Negative for dysuria. Musculoskeletal: Negative for back pain. Skin: Negative for rash. Neurological: Negative for headaches, focal weakness or numbness.   ____________________________________________   PHYSICAL EXAM:  VITAL SIGNS: ED Triage Vitals  Enc Vitals Group     BP 10/11/19 0623 (!) 167/83     Pulse Rate 10/11/19 0623 93     Resp 10/11/19 0623 (!) 22     Temp --      Temp src --      SpO2 10/11/19 0623 100 %     Weight 10/11/19 0619 126 lb 1.7 oz (57.2 kg)     Height --      Head Circumference --      Peak Flow --      Pain Score --      Pain Loc --      Pain Edu? --      Excl. in [place]? --     Constitutional: Unresponsive. Eyes: Conjunctivae are normal. PERRL. EOMI. Head: Atraumatic. Nose: No congestion/rhinnorhea. Mouth/Throat: Mucous membranes are mildly dry. Neck: No stridor.   Cardiovascular: Normal rate, regular rhythm. Grossly normal heart sounds.  Good peripheral circulation. Respiratory: Gurgling respirations.  BVM.   Gastrointestinal: Soft and nontender. No distention. No abdominal bruits. No CVA tenderness. Musculoskeletal: No lower extremity tenderness nor edema.  No joint effusions. Neurologic: Unresponsive.   Skin:  Skin is warm, dry and intact. No rash noted. Psychiatric: Unable to assess. ____________________________________________   LABS (all labs ordered are listed, but only abnormal results are displayed)  Labs Reviewed  LACTIC ACID, PLASMA - Abnormal; Notable for the following components:      Result Value   Lactic Acid, Venous 4.8 (*)    All other components within normal limits  CBC WITH DIFFERENTIAL/PLATELET - Abnormal; Notable for the following components:   WBC 12.4 (*)    RBC 3.46 (*)    Hemoglobin 10.5 (*)    HCT 32.8 (*)    RDW 15.9 (*)    Neutro Abs 8.4 (*)    Abs Immature Granulocytes 0.38 (*)    All  other components within normal limits  COMPREHENSIVE METABOLIC PANEL - Abnormal; Notable for the following components:   Sodium 134 (*)    Chloride 96 (*)    Glucose, Bld 222 (*)    BUN 28 (*)    Creatinine, Ser 5.56 (*)    Calcium 8.5 (*)    Total Protein 6.0 (*)  GFR calc non Af Amer 7 (*)    GFR calc Af Amer 8 (*)    Anion gap 16 (*)    All other components within normal limits  POC SARS CORONAVIRUS 2 AG - Abnormal; Notable for the following components:   SARS Coronavirus 2 Ag POSITIVE (*)    All other components within normal limits  TROPONIN I (HIGH SENSITIVITY) - Abnormal; Notable for the following components:   Troponin I (High Sensitivity) 64 (*)    All other components within normal limits  CULTURE, BLOOD (ROUTINE X 2)  CULTURE, BLOOD (ROUTINE X 2)  CULTURE, RESPIRATORY  LACTIC ACID, PLASMA  BRAIN NATRIURETIC PEPTIDE  BLOOD GAS, ARTERIAL  CBC  CREATININE, SERUM   ____________________________________________  EKG  ED ECG REPORT I, Pam Vanalstine J, the attending physician, personally viewed and interpreted this ECG.   Date: 10/11/2019  EKG Time: 0616  Rate: 108  Rhythm: sinus tachycardia  Axis: Normal  Intervals:none  ST&T Change: Nonspecific  ____________________________________________  RADIOLOGY  ED MD interpretation: Pending  Official radiology report(s): No results found.  ____________________________________________   PROCEDURES  Procedure(s) performed (including Critical Care):  Procedure Name: Intubation Date/Time: 10/11/2019 6:40 AM Performed by: Paulette Blanch, MD Pre-anesthesia Checklist: Patient identified, Patient being monitored, Emergency Drugs available, Timeout performed and Suction available Oxygen Delivery Method: Non-rebreather mask Preoxygenation: Pre-oxygenation with 100% oxygen Induction Type: Rapid sequence Ventilation: Mask ventilation without difficulty Laryngoscope Size: Glidescope and 3 Grade View: Grade I Tube size:  7.5 mm Number of attempts: 1 Airway Equipment and Method: Rigid stylet Placement Confirmation: ETT inserted through vocal cords under direct vision,  CO2 detector and Breath sounds checked- equal and bilateral     CRITICAL CARE Performed by: Paulette Blanch   Total critical care time: 45 minutes  Critical care time was exclusive of separately billable procedures and treating other patients.  Critical care was necessary to treat or prevent imminent or life-threatening deterioration.  Critical care was time spent personally by me on the following activities: development of treatment plan with patient and/or surrogate as well as nursing, discussions with consultants, evaluation of patient's response to treatment, examination of patient, obtaining history from patient or surrogate, ordering and performing treatments and interventions, ordering and review of laboratory studies, ordering and review of radiographic studies, pulse oximetry and re-evaluation of patient's condition.    ____________________________________________   INITIAL IMPRESSION / ASSESSMENT AND PLAN / ED COURSE  As part of my medical decision making, I reviewed the following data within the [facility] notes reviewed and incorporated, Labs reviewed, EKG interpreted, Old chart reviewed, Radiograph reviewed, Discussed with admitting physician and Notes from prior ED visits     Phyllis [name] was evaluated in Emergency Department on 10/11/2019 for the symptoms described in the history of present illness. She was evaluated in the context of the global COVID-19 pandemic, which necessitated consideration that the patient might be at risk for infection with the SARS-CoV-2 virus that causes COVID-19. Institutional protocols and algorithms that pertain to the evaluation of patients at risk for COVID-19 are in a state of rapid change based on information released by regulatory bodies including the CDC and  federal and state organizations. These policies and algorithms were followed during the patient's care in the ED.    73 year old female presenting to the ED for respiratory failure. Differential includes, but is not limited to, viral syndrome, bronchitis including COPD exacerbation, pneumonia, reactive airway disease including asthma, CHF including exacerbation with or without  pulmonary/interstitial edema, pneumothorax, ACS, thoracic trauma, and pulmonary embolism.   Clinical Course as of Oct 11 719  Sat Oct 11, 2019  0635 I have spoken with both CCU nurse practitioner as well as Dr. Candiss Norse from nephrology.  Patient had two COVID-19 antigen test from the same cartridge - one was positive and one was negative.  Nursing supervisor has authorized rapid PCR test.   [JS]  0714 Lactic acid noted.  Will cover with broad-spectrum IV antibiotics.   [JS]    Clinical Course User Index [JS] Paulette Blanch, MD     ____________________________________________   FINAL CLINICAL IMPRESSION(S) / ED DIAGNOSES  Final diagnoses:  Acute respiratory failure with hypoxia ([place])  Acute pulmonary edema (HCC)  Sepsis, due to unspecified organism, unspecified whether acute organ dysfunction present [facility])     ED Discharge Orders    None       Note:  This document was prepared using Dragon voice recognition software and may include unintentional dictation errors.   Paulette Blanch, MD 10/11/19 [phone]

## 2019-10-11 NOTE — Consult Note (Signed)
Phyllis [name] MRN: [id number] DOB/AGE: 1946-11-05 73 y.o. Primary Care Physician:Hande, Cherlyn Labella, MD Admit date: 10/11/2019 Chief Complaint:  Chief Complaint  Patient presents with  . Respiratory Distress   HPI: Patient 73 year old Caucasian female with a past medical history of  ESRD on HD T/Th/Sat, COPD, diabetes sent from [place] for respiratory failure.   History of present illness dates back to this morning when [facility] staff noted patient was struggling to breathe and had gurgling respirations. They went to get an albuterol nebulizer for the patient and when they returned patient was unresponsive and pulseless per the report. CPR was initiated.   EMS was called upon EMS arrival, patient had pulses and had agonal respirations. Patient was brought to the ER , upon arrival  to the ER  Patient was  unresponsive, and was being bagged with gurgling respirations. Patient was admitted , intubated with multiorgan failure Patient remains intubated unable to offer any complaints     Past Medical History:  Diagnosis Date  . (HFpEF) heart failure with preserved ejection fraction ([place])    a. TTE 01/2014: nl LV sys fxn, no valvular abnormalities; b. TTE 11/16: nl EF, mild LVH;  c. 04/2019 Echo: EF 60-65%. DD. Nl RV fxn. Mod AS. 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  . Allergy   . Anemia of chronic disease   . Anxiety   . Aortic atherosclerosis ([place])   . Asthma   . Chronic back pain   . COPD (chronic obstructive pulmonary disease) ([place])   . Diabetes mellitus with complication ([place])   . ESRD on hemodialysis ([place])    a. Tues/Sat; b. 2/2 small kidneys  . Essential hypertension   . Fistula    lower left arm  . GERD (gastroesophageal reflux disease)   . Gout   . History of exercise stress test    a. 01/2014: no evidence of ischemia; b. Lexiscan 08/2015: no sig ischemia, severe GI uptake artifact, low risk; c. CPET @ [facility] 09/2016: exercised  3 min 12 sec on bike without incline, 2.28 METs, VO2 of 8.1, 48% of predicted, indicating mod to sev functional impairment, evidence of blunted HR, stroke volume, and BP augmentation as well as ventilation-perfusion mismatch with exercise  . HLD (hyperlipidemia)   . Moderate aortic stenosis    a. 04/2019 Echo: Mod AS.  . Non-obstructive Carotid arterial disease ([place])    a. 12/2017: <50% bilat ICA dzs.  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 Sleep apnea         Family History  Problem Relation Age of Onset  . Hypertension Mother   . Hyperlipidemia Mother   . Heart disease Father   . Heart attack Father 52  . Hypertension Father   . Hyperlipidemia Father   . Heart disease Brother        CABG   . Heart attack Brother   . Breast cancer Neg Hx     Social History:  reports that she has never smoked. She has never used smokeless tobacco. She reports that she does not drink alcohol or use drugs.   Allergies:  Allergies  Allergen Reactions  . Enalapril Maleate Other (See Comments)    Other reaction(s): Headache  . Nitrofurantoin Swelling and Rash    Other Reaction: swelling of body  . Sulfamethoxazole-Trimethoprim Swelling  . 2,4-D Dimethylamine (Amisol) Rash and Other (See Comments)    Other Reaction: h/a  .  Baclofen Other (See Comments) and Nausea Only    lightheadness ,drowsiness , muscle weakness , twitching in hands   . Neosporin [Neomycin-Bacitracin Zn-Polymyx] Other (See Comments) and Rash    Other Reaction: irritation Skin irritation   . Quinine Nausea And Vomiting, Rash and Other (See Comments)    Other Reaction: Vomiting, rash, h/a, vision  . Ultram [Tramadol] Palpitations  . Zocor [Simvastatin] Other (See Comments) and Rash    Other Reaction: muscle spasms Muscle pain and spasms  . Bactrim [Sulfamethoxazole-Trimethoprim] Swelling  . Levodopa Other (See Comments)    Reaction: unknown  . Macrodantin [Nitrofurantoin Macrocrystal] Swelling   . Quinine Derivatives Other (See Comments)    Vertigo,nausea vomiting blurred vision headache ears sensitivity     Medications Prior to Admission  Medication Sig Dispense Refill  . acetaminophen (TYLENOL) 500 MG tablet Take 1 tablet (500 mg total) by mouth every 6 (six) hours as needed for mild pain or fever. 30 tablet 0  . albuterol (PROVENTIL HFA;VENTOLIN HFA) 108 (90 Base) MCG/ACT inhaler Inhale 2 puffs into the lungs every 6 (six) hours as needed for wheezing or shortness of breath.    Marland Kitchen amLODipine (NORVASC) 10 MG tablet Take 10 mg by mouth daily.     . calcium acetate (PHOSLO) 667 MG capsule Take 1,334 mg by mouth 3 (three) times daily with meals.     . carvedilol (COREG) 12.5 MG tablet Take 12.5 mg by mouth 2 (two) times daily with a meal.     . cetirizine (ZYRTEC) 10 MG tablet Take 10 mg by mouth daily as needed for allergies.     Marland Kitchen clopidogrel (PLAVIX) 75 MG tablet Take 1 tablet (75 mg total) by mouth daily. 30 tablet 0  . Fluticasone-Salmeterol (ADVAIR) 250-50 MCG/DOSE AEPB Inhale 1 puff into the lungs 2 (two) times daily as needed (for shortness of breath). 60 each   . hydrALAZINE (APRESOLINE) 50 MG tablet Take 1 tablet (50 mg total) by mouth every 8 (eight) hours.    Marland Kitchen HYDROcodone-acetaminophen (NORCO) 7.5-325 MG tablet Take 1 tablet by mouth every 6 (six) hours as needed (pain). 12 tablet 0  . lidocaine-prilocaine (EMLA) cream Apply 1 application topically as needed (prior to treatment).     . losartan (COZAAR) 50 MG tablet Take 50 mg by mouth daily.    Marland Kitchen omeprazole (PRILOSEC) 20 MG capsule Take 1 capsule (20 mg total) by mouth 2 (two) times daily before a meal.    . ondansetron (ZOFRAN) 4 MG tablet Take 4 mg by mouth every 8 (eight) hours as needed.    Marland Kitchen oxybutynin (DITROPAN XL) 15 MG 24 hr tablet Take 15 mg by mouth daily.    . pravastatin (PRAVACHOL) 40 MG tablet Take 40 mg by mouth at bedtime.     Marland Kitchen scopolamine (TRANSDERM-SCOP) 1 MG/3DAYS Place 1 patch onto the skin every 3  (three) days.    Marland Kitchen topiramate (TOPAMAX) 25 MG tablet Take 1 tablet (25 mg total) by mouth as needed (for headaches).    . torsemide (DEMADEX) 20 MG tablet Take 2 tablets (40 mg total) by mouth every Monday,Wednesday,Friday, and Sunday at 6 PM.         ROS: Unable to get any data as patient is intubated  . chlorhexidine gluconate (MEDLINE KIT)  15 mL Mouth Rinse BID  . heparin  5,000 Units Subcutaneous Q8H  . mouth rinse  15 mL Mouth Rinse 10 times per day     Physical Exam: Vital signs in last 24  hours: Temp:  [97.1 F (36.2 C)-98 F (36.7 C)] 97.3 F (36.3 C) (11/21 0905) Pulse Rate:  [59-93] 70 (11/21 1100) Resp:  [13-22] 17 (11/21 1100) BP: (99-167)/(47-83) 151/59 (11/21 1100) SpO2:  [100 %] 100 % (11/21 1100) FiO2 (%):  [40 %-50 %] 40 % (11/21 0905) Weight:  [57.2 kg] 57.2 kg (11/21 0905) Weight change:     Intake/Output from previous day: No intake/output data recorded. Total I/O In: 102.4 [I.V.:94; IV Piggyback:8.4] Out: 145 [Urine:45; Emesis/NG output:100]   Physical Exam: General- pt is intubated Resp-intubated, ET tube in situ, rhonchi bilaterally  CVS- S1S2 regular in rate and rhythm GIT- BS+, soft, NT, ND EXT- NO LE Edema, no Cyanosis Access-AVF present   Lab Results: CBC Recent Labs    10/11/19 0626  WBC 12.4*  HGB 10.5*  HCT 32.8*  PLT 272    BMET Recent Labs    10/11/19 0626  NA 134*  K 5.0  CL 96*  CO2 22  GLUCOSE 222*  BUN 28*  CREATININE 5.56*  CALCIUM 8.5*    MICRO Recent Results (from the past 240 hour(s))  SARS CORONAVIRUS 2 (TAT 6-24 HRS) Nasopharyngeal Nasopharyngeal Swab     Status: None   Collection Time: 10/01/19  4:50 PM   Specimen: Nasopharyngeal Swab  Result Value Ref Range Status   SARS Coronavirus 2 NEGATIVE NEGATIVE Final    Comment: (NOTE) SARS-CoV-2 target nucleic acids are NOT DETECTED. The SARS-CoV-2 RNA is generally detectable in upper and lower respiratory specimens during the acute phase of  infection. Negative results do not preclude SARS-CoV-2 infection, do not rule out co-infections with other pathogens, and should not be used as the sole basis for treatment or other patient management decisions. Negative results must be combined with clinical observations, patient history, and epidemiological information. The expected result is Negative. Fact Sheet for Patients: SugarRoll.be Fact Sheet for Healthcare Providers: https://www.woods-mathews.com/ This test is not yet approved or cleared by the [place] FDA and  has been authorized for detection and/or diagnosis of SARS-CoV-2 by FDA under an Emergency Use Authorization (EUA). This EUA will remain  in effect (meaning this test can be used) for the duration of the COVID-19 declaration under Section 56 4(b)(1) of the Act, 21 U.S.C. section 360bbb-3(b)(1), unless the authorization is terminated or revoked sooner. Performed at [facility], [place] [address], [place],  [address]   SARS Coronavirus 2 by RT PCR (hospital order, performed in [facility] hospital lab) Nasopharyngeal Nasopharyngeal Swab     Status: None   Collection Time: 10/01/19 11:04 PM   Specimen: Nasopharyngeal Swab  Result Value Ref Range Status   SARS Coronavirus 2 NEGATIVE NEGATIVE Final    Comment: (NOTE) If result is NEGATIVE SARS-CoV-2 target nucleic acids are NOT DETECTED. The SARS-CoV-2 RNA is generally detectable in upper and lower  respiratory specimens during the acute phase of infection. The lowest  concentration of SARS-CoV-2 viral copies this assay can detect is 250  copies / mL. A negative result does not preclude SARS-CoV-2 infection  and should not be used as the sole basis for treatment or other  patient management decisions.  A negative result may occur with  improper specimen collection / handling, submission of specimen other  than nasopharyngeal swab, presence of viral mutation(s)  within the  areas targeted by this assay, and inadequate number of viral copies  (<250 copies / mL). A negative result must be combined with clinical  observations, patient history, and epidemiological information.  If result is POSITIVE SARS-CoV-2 target nucleic acids are DETECTED. The SARS-CoV-2 RNA is generally detectable in upper and lower  respiratory specimens dur ing the acute phase of infection.  Positive  results are indicative of active infection with SARS-CoV-2.  Clinical  correlation with patient history and other diagnostic information is  necessary to determine patient infection status.  Positive results do  not rule out bacterial infection or co-infection with other viruses. If result is PRESUMPTIVE POSTIVE SARS-CoV-2 nucleic acids MAY BE PRESENT.   A presumptive positive result was obtained on the submitted specimen  and confirmed on repeat testing.  While 2019 novel coronavirus  (SARS-CoV-2) nucleic acids may be present in the submitted sample  additional confirmatory testing may be necessary for epidemiological  and / or clinical management purposes  to differentiate between  SARS-CoV-2 and other Sarbecovirus currently known to infect humans.  If clinically indicated additional testing with an alternate test  methodology [phone]) is advised. The SARS-CoV-2 RNA is generally  detectable in upper and lower respiratory sp ecimens during the acute  phase of infection. The expected result is Negative. Fact Sheet for Patients:  StrictlyIdeas.no Fact Sheet for Healthcare Providers: BankingDealers.co.za This test is not yet approved or cleared by the [place] FDA and has been authorized for detection and/or diagnosis of SARS-CoV-2 by FDA under an Emergency Use Authorization (EUA).  This EUA will remain in effect (meaning this test can be used) for the duration of the COVID-19 declaration under Section 564(b)(1) of the Act,  21 U.S.C. section 360bbb-3(b)(1), unless the authorization is terminated or revoked sooner. Performed at [facility], [place], [place] [address]   SARS CORONAVIRUS 2 (TAT 6-24 HRS) Nasopharyngeal Nasopharyngeal Swab     Status: None   Collection Time: 10/04/19  5:13 PM   Specimen: Nasopharyngeal Swab  Result Value Ref Range Status   SARS Coronavirus 2 NEGATIVE NEGATIVE Final    Comment: (NOTE) SARS-CoV-2 target nucleic acids are NOT DETECTED. The SARS-CoV-2 RNA is generally detectable in upper and lower respiratory specimens during the acute phase of infection. Negative results do not preclude SARS-CoV-2 infection, do not rule out co-infections with other pathogens, and should not be used as the sole basis for treatment or other patient management decisions. Negative results must be combined with clinical observations, patient history, and epidemiological information. The expected result is Negative. Fact Sheet for Patients: SugarRoll.be Fact Sheet for Healthcare Providers: https://www.woods-mathews.com/ This test is not yet approved or cleared by the [place] FDA and  has been authorized for detection and/or diagnosis of SARS-CoV-2 by FDA under an Emergency Use Authorization (EUA). This EUA will remain  in effect (meaning this test can be used) for the duration of the COVID-19 declaration under Section 56 4(b)(1) of the Act, 21 U.S.C. section 360bbb-3(b)(1), unless the authorization is terminated or revoked sooner. Performed at [facility], [place] [address], [place], [place] [address]   SARS CORONAVIRUS 2 (TAT 6-24 HRS) Nasopharyngeal Nasopharyngeal Swab     Status: None   Collection Time: 10/07/19  3:26 PM   Specimen: Nasopharyngeal Swab  Result Value Ref Range Status   SARS Coronavirus 2 NEGATIVE NEGATIVE Final    Comment: (NOTE) SARS-CoV-2 target nucleic acids are NOT DETECTED. The SARS-CoV-2  RNA is generally detectable in upper and lower respiratory specimens during the acute phase of infection. Negative results do not preclude SARS-CoV-2 infection, do not rule out co-infections with other pathogens, and should not be used as the  sole basis for treatment or other patient management decisions. Negative results must be combined with clinical observations, patient history, and epidemiological information. The expected result is Negative. Fact Sheet for Patients: SugarRoll.be Fact Sheet for Healthcare Providers: https://www.woods-mathews.com/ This test is not yet approved or cleared by the [place] FDA and  has been authorized for detection and/or diagnosis of SARS-CoV-2 by FDA under an Emergency Use Authorization (EUA). This EUA will remain  in effect (meaning this test can be used) for the duration of the COVID-19 declaration under Section 56 4(b)(1) of the Act, 21 U.S.C. section 360bbb-3(b)(1), unless the authorization is terminated or revoked sooner. Performed at [facility], [place] [address] [place], [place] [address]   Culture, blood (routine x 2)     Status: None (Preliminary result)   Collection Time: 10/11/19  6:52 AM   Specimen: BLOOD  Result Value Ref Range Status   Specimen Description BLOOD BLOOD RIGHT HAND  Final   Special Requests   Final    BOTTLES DRAWN AEROBIC AND ANAEROBIC Blood Culture results may not be optimal due to an excessive volume of blood received in culture bottles   Culture   Final    NO GROWTH < 12 HOURS Performed at [facility], [address], [place], [place] [address]    Report Status PENDING  Incomplete  Culture, blood (routine x 2)     Status: None (Preliminary result)   Collection Time: 10/11/19  6:52 AM   Specimen: BLOOD  Result Value Ref Range Status   Specimen Description BLOOD LEFT ANTECUBITAL  Final   Special Requests   Final    BOTTLES DRAWN AEROBIC AND  ANAEROBIC Blood Culture results may not be optimal due to an excessive volume of blood received in culture bottles   Culture   Final    NO GROWTH < 12 HOURS Performed at [facility], [address] [place],  [address]    Report Status PENDING  Incomplete      Lab Results  Component Value Date   PTH 160 (H) 05/24/2019   CALCIUM 8.5 (L) 10/11/2019   PHOS 3.5 10/04/2019      Impression:    1)Renal  ESRD on HD Pt is on Tuesday/ Thursday/Saturday schedule Patient family has decided now on comfort measures  2) cardiac arrest Patient is s/p cardiac arrest Pt ws successfully resuscitated  Patient is on vasopressors   3)Anemia of chronic disease  HGb at goal (9--11)   4)  Secondary hyperparathyroidism CKD Mineral-Bone Disorder Secondary Hyperparathyroidism  Present. Phosphorus at goal.   5)Acute resp failure Pt currently intubated   6)Hyponatremia Secondary to ESRD  7)Acid base Co2 at goal     Plan:  I called pt family Mr Thurmond Butts ( nephew [phone]), pt friends  Ms wells and ms boone [phone]) and Mr Aleene Davidson( RN on the case) . We all had extensive discussion with family and they all wished to  Extubate/ ask for comfort measure and not ask for any aggressive measures. I agreed and respected the family decision for the comfort measures  I spent more than 45 minutes in care coordination .  After discussion with Critical attending plan was to extubate the pt.   Upon extubation Pt vitals were stable Pt was awake,alert and  responding  Plan updated I later called family and informed them of the her clinical improvement. I got the informed consent for HD  Will dialyze pt today  Danea Manter s Theador Hawthorne 10/11/2019, 11:36 AM

## 2019-10-11 NOTE — H&P (Signed)
Name: Phyllis [name] MRN: [id number] DOB: 1946-04-13     CONSULTATION DATE: 10/11/2019  REFERRING MD : Dwaine Deter  CHIEF COMPLAINT:  Cardiac arrest    HISTORY OF PRESENT ILLNESS:    73 y.o. female well-known to the emergency department with a history of ESRD on HD T/Th/Sat, COPD, diabetes sent from [place] for respiratory failure.  She is due for dialysis this morning.  Staff noted patient was struggling to breathe and had gurgling respirations.  They went to get an albuterol nebulizer for the patient and when they returned patient was unresponsive and pulseless per the report.  CPR was initiated.  Upon EMS arrival, patient had pulses and had agonal respirations.  Arrives to the ED unresponsive, being bagged with gurgling respirations.  She is NOT a candidate for hypothermia protocol due to multiorgan failure and end stage renal disease  Prolonged cardiac arrest    currently intubated and on vent support Multiorgan failure I called emergency contacts listed and they have stated she has suffered long enough, they have consented to DNR status.    PAST MEDICAL HISTORY :   has a past medical history of (HFpEF) heart failure with preserved ejection fraction ([place]), Allergy, Anemia of chronic disease, Anxiety, Aortic atherosclerosis ([place]), Asthma, Chronic back pain, COPD (chronic obstructive pulmonary disease) ([place]), Diabetes mellitus with complication ([place]), ESRD on hemodialysis ([place]), Essential hypertension, Fistula, GERD (gastroesophageal reflux disease), Gout, History of exercise stress test, HLD (hyperlipidemia), Moderate aortic stenosis, Non-obstructive Carotid arterial disease ([place]), 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, and Sleep apnea.  has a past surgical history that includes Gallbladder surgery; carpel tunnel; Cardiac catheterization (N/A, 04/12/2015); Cardiac catheterization (N/A, 04/12/2015); and Cardiac catheterization (N/A, 06/09/2015). Prior to  Admission medications   Medication Sig Start Date End Date Taking? Authorizing Provider  acetaminophen (TYLENOL) 500 MG tablet Take 1 tablet (500 mg total) by mouth every 6 (six) hours as needed for mild pain or fever. 09/29/19   Danford, Suann Larry, MD  albuterol (PROVENTIL HFA;VENTOLIN HFA) 108 (90 Base) MCG/ACT inhaler Inhale 2 puffs into the lungs every 6 (six) hours as needed for wheezing or shortness of breath.    [provider]  amLODipine (NORVASC) 10 MG tablet Take 10 mg by mouth daily.  12/29/13   [provider]  calcium acetate (PHOSLO) 667 MG capsule Take 1,334 mg by mouth 3 (three) times daily with meals.     [provider]  carvedilol (COREG) 12.5 MG tablet Take 12.5 mg by mouth 2 (two) times daily with a meal.  01/09/14   [provider]  cetirizine (ZYRTEC) 10 MG tablet Take 10 mg by mouth daily as needed for allergies.     [provider]  clopidogrel (PLAVIX) 75 MG tablet Take 1 tablet (75 mg total) by mouth daily. 01/18/18   Epifanio Lesches, MD  Fluticasone-Salmeterol (ADVAIR) 250-50 MCG/DOSE AEPB Inhale 1 puff into the lungs 2 (two) times daily as needed (for shortness of breath). 09/07/19   Henreitta Leber, MD  hydrALAZINE (APRESOLINE) 50 MG tablet Take 1 tablet (50 mg total) by mouth every 8 (eight) hours. 09/29/19   Danford, Suann Larry, MD  HYDROcodone-acetaminophen (NORCO) 7.5-325 MG tablet Take 1 tablet by mouth every 6 (six) hours as needed (pain). 10/08/19   Danford, Suann Larry, MD  lidocaine-prilocaine (EMLA) cream Apply 1 application topically as needed (prior to treatment).     [provider]  losartan (COZAAR) 50 MG tablet Take 50 mg by mouth  daily. 03/18/19   [provider]  omeprazole (PRILOSEC) 20 MG capsule Take 1 capsule (20 mg total) by mouth 2 (two) times daily before a meal. 09/07/19   Sainani, Belia Heman, MD  ondansetron (ZOFRAN) 4 MG tablet Take 4 mg by mouth every 8 (eight) hours as  needed. 05/20/19   [provider]  oxybutynin (DITROPAN XL) 15 MG 24 hr tablet Take 15 mg by mouth daily. 12/24/17   [provider]  pravastatin (PRAVACHOL) 40 MG tablet Take 40 mg by mouth at bedtime.     [provider]  scopolamine (TRANSDERM-SCOP) 1 MG/3DAYS Place 1 patch onto the skin every 3 (three) days.    [provider]  topiramate (TOPAMAX) 25 MG tablet Take 1 tablet (25 mg total) by mouth as needed (for headaches). 09/07/19   Henreitta Leber, MD  torsemide (DEMADEX) 20 MG tablet Take 2 tablets (40 mg total) by mouth every Monday,Wednesday,Friday, and Sunday at 6 PM. 10/01/19   Danford, Suann Larry, MD   Allergies  Allergen Reactions  . Enalapril Maleate Other (See Comments)    Other reaction(s): Headache  . Nitrofurantoin Swelling and Rash    Other Reaction: swelling of body  . Sulfamethoxazole-Trimethoprim Swelling  . 2,4-D Dimethylamine (Amisol) Rash and Other (See Comments)    Other Reaction: h/a  . Baclofen Other (See Comments) and Nausea Only    lightheadness ,drowsiness , muscle weakness , twitching in hands   . Neosporin [Neomycin-Bacitracin Zn-Polymyx] Other (See Comments) and Rash    Other Reaction: irritation Skin irritation   . Quinine Nausea And Vomiting, Rash and Other (See Comments)    Other Reaction: Vomiting, rash, h/a, vision  . Ultram [Tramadol] Palpitations  . Zocor [Simvastatin] Other (See Comments) and Rash    Other Reaction: muscle spasms Muscle pain and spasms  . Bactrim [Sulfamethoxazole-Trimethoprim] Swelling  . Levodopa Other (See Comments)    Reaction: unknown  . Macrodantin [Nitrofurantoin Macrocrystal] Swelling  . Quinine Derivatives Other (See Comments)    Vertigo,nausea vomiting blurred vision headache ears sensitivity     FAMILY HISTORY:  family history includes Heart attack in her brother; Heart attack (age of onset: 23) in her father; Heart disease in her brother and father; Hyperlipidemia in her  father and mother; Hypertension in her father and mother. SOCIAL HISTORY:  reports that she has never smoked. She has never used smokeless tobacco. She reports that she does not drink alcohol or use drugs.  REVIEW OF SYSTEMS:   Unable to obtain due to critical illness   REVIEW OF SYSTEMS  PATIENT IS UNABLE TO PROVIDE COMPLETE REVIEW OF SYSTEM S DUE TO SEVERE CRITICAL ILLNESS AND ENCEPHALOPATHY   VITAL SIGNS: Temp:  [97.1 F (36.2 C)-98 F (36.7 C)] 97.1 F (36.2 C) (11/21 0815) Pulse Rate:  [61-93] 65 (11/21 0815) Resp:  [14-22] 15 (11/21 0815) BP: (99-167)/(47-83) 144/51 (11/21 0815) SpO2:  [100 %] 100 % (11/21 0815) FiO2 (%):  [40 %-50 %] 40 % (11/21 0805) Weight:  [57.2 kg] 57.2 kg (11/21 0619)   No intake/output data recorded. No intake/output data recorded.   SpO2: 100 % FiO2 (%): 40 %  COVID-19 DISASTER DECLARATION:   FULL CONTACT PHYSICAL EXAMINATION WAS NOT POSSIBLE DUE TO TREATMENT OF COVID-19 AND   CONSERVATION OF PERSONAL PROTECTIVE EQUIPMENT, LIMITED EXAM FINDINGS INCLUDE-   Patient assessed or the symptoms described in the history of present illness.   In the context of the Global COVID-19 pandemic, which necessitated consideration that the patient  might be at risk for infection with the SARS-CoV-2 virus that causes COVID-19, Institutional protocols and algorithms that pertain to the evaluation of patients at risk for COVID-19 are in a state of rapid change based on information released by regulatory bodies including the CDC and federal and state organizations. These policies and algorithms were followed during the patient's care while in hospital.     MEDICATIONS: I have reviewed all medications and confirmed regimen as documented   CULTURE RESULTS   Recent Results (from the past 240 hour(s))  SARS CORONAVIRUS 2 (TAT 6-24 HRS) Nasopharyngeal Nasopharyngeal Swab     Status: None   Collection Time: 10/01/19  4:50 PM   Specimen: Nasopharyngeal Swab   Result Value Ref Range Status   SARS Coronavirus 2 NEGATIVE NEGATIVE Final    Comment: (NOTE) SARS-CoV-2 target nucleic acids are NOT DETECTED. The SARS-CoV-2 RNA is generally detectable in upper and lower respiratory specimens during the acute phase of infection. Negative results do not preclude SARS-CoV-2 infection, do not rule out co-infections with other pathogens, and should not be used as the sole basis for treatment or other patient management decisions. Negative results must be combined with clinical observations, patient history, and epidemiological information. The expected result is Negative. Fact Sheet for Patients: SugarRoll.be Fact Sheet for Healthcare Providers: https://www.woods-mathews.com/ This test is not yet approved or cleared by the [place] FDA and  has been authorized for detection and/or diagnosis of SARS-CoV-2 by FDA under an Emergency Use Authorization (EUA). This EUA will remain  in effect (meaning this test can be used) for the duration of the COVID-19 declaration under Section 56 4(b)(1) of the Act, 21 U.S.C. section 360bbb-3(b)(1), unless the authorization is terminated or revoked sooner. Performed at [facility], [place] [address] [place], [place] [address]   SARS Coronavirus 2 by RT PCR (hospital order, performed in [facility] hospital lab) Nasopharyngeal Nasopharyngeal Swab     Status: None   Collection Time: 10/01/19 11:04 PM   Specimen: Nasopharyngeal Swab  Result Value Ref Range Status   SARS Coronavirus 2 NEGATIVE NEGATIVE Final    Comment: (NOTE) If result is NEGATIVE SARS-CoV-2 target nucleic acids are NOT DETECTED. The SARS-CoV-2 RNA is generally detectable in upper and lower  respiratory specimens during the acute phase of infection. The lowest  concentration of SARS-CoV-2 viral copies this assay can detect is 250  copies / mL. A negative result does not preclude SARS-CoV-2 infection   and should not be used as the sole basis for treatment or other  patient management decisions.  A negative result may occur with  improper specimen collection / handling, submission of specimen other  than nasopharyngeal swab, presence of viral mutation(s) within the  areas targeted by this assay, and inadequate number of viral copies  (<250 copies / mL). A negative result must be combined with clinical  observations, patient history, and epidemiological information. If result is POSITIVE SARS-CoV-2 target nucleic acids are DETECTED. The SARS-CoV-2 RNA is generally detectable in upper and lower  respiratory specimens dur ing the acute phase of infection.  Positive  results are indicative of active infection with SARS-CoV-2.  Clinical  correlation with patient history and other diagnostic information is  necessary to determine patient infection status.  Positive results do  not rule out bacterial infection or co-infection with other viruses. If result is PRESUMPTIVE POSTIVE SARS-CoV-2 nucleic acids MAY BE PRESENT.   A presumptive positive result was obtained on the submitted specimen  and confirmed  on repeat testing.  While 2019 novel coronavirus  (SARS-CoV-2) nucleic acids may be present in the submitted sample  additional confirmatory testing may be necessary for epidemiological  and / or clinical management purposes  to differentiate between  SARS-CoV-2 and other Sarbecovirus currently known to infect humans.  If clinically indicated additional testing with an alternate test  methodology [phone]) is advised. The SARS-CoV-2 RNA is generally  detectable in upper and lower respiratory sp ecimens during the acute  phase of infection. The expected result is Negative. Fact Sheet for Patients:  StrictlyIdeas.no Fact Sheet for Healthcare Providers: BankingDealers.co.za This test is not yet approved or cleared by the [place] FDA and has  been authorized for detection and/or diagnosis of SARS-CoV-2 by FDA under an Emergency Use Authorization (EUA).  This EUA will remain in effect (meaning this test can be used) for the duration of the COVID-19 declaration under Section 564(b)(1) of the Act, 21 U.S.C. section 360bbb-3(b)(1), unless the authorization is terminated or revoked sooner. Performed at [facility], [place], [place] [address]   SARS CORONAVIRUS 2 (TAT 6-24 HRS) Nasopharyngeal Nasopharyngeal Swab     Status: None   Collection Time: 10/04/19  5:13 PM   Specimen: Nasopharyngeal Swab  Result Value Ref Range Status   SARS Coronavirus 2 NEGATIVE NEGATIVE Final    Comment: (NOTE) SARS-CoV-2 target nucleic acids are NOT DETECTED. The SARS-CoV-2 RNA is generally detectable in upper and lower respiratory specimens during the acute phase of infection. Negative results do not preclude SARS-CoV-2 infection, do not rule out co-infections with other pathogens, and should not be used as the sole basis for treatment or other patient management decisions. Negative results must be combined with clinical observations, patient history, and epidemiological information. The expected result is Negative. Fact Sheet for Patients: SugarRoll.be Fact Sheet for Healthcare Providers: https://www.woods-mathews.com/ This test is not yet approved or cleared by the [place] FDA and  has been authorized for detection and/or diagnosis of SARS-CoV-2 by FDA under an Emergency Use Authorization (EUA). This EUA will remain  in effect (meaning this test can be used) for the duration of the COVID-19 declaration under Section 56 4(b)(1) of the Act, 21 U.S.C. section 360bbb-3(b)(1), unless the authorization is terminated or revoked sooner. Performed at [facility], [place] [address], [place], [place] [address]   SARS CORONAVIRUS 2 (TAT 6-24 HRS) Nasopharyngeal Nasopharyngeal  Swab     Status: None   Collection Time: 10/07/19  3:26 PM   Specimen: Nasopharyngeal Swab  Result Value Ref Range Status   SARS Coronavirus 2 NEGATIVE NEGATIVE Final    Comment: (NOTE) SARS-CoV-2 target nucleic acids are NOT DETECTED. The SARS-CoV-2 RNA is generally detectable in upper and lower respiratory specimens during the acute phase of infection. Negative results do not preclude SARS-CoV-2 infection, do not rule out co-infections with other pathogens, and should not be used as the sole basis for treatment or other patient management decisions. Negative results must be combined with clinical observations, patient history, and epidemiological information. The expected result is Negative. Fact Sheet for Patients: SugarRoll.be Fact Sheet for Healthcare Providers: https://www.woods-mathews.com/ This test is not yet approved or cleared by the [place] FDA and  has been authorized for detection and/or diagnosis of SARS-CoV-2 by FDA under an Emergency Use Authorization (EUA). This EUA will remain  in effect (meaning this test can be used) for the duration of the COVID-19 declaration under Section 56 4(b)(1) of the Act, 21 U.S.C. section 360bbb-3(b)(1),  unless the authorization is terminated or revoked sooner. Performed at [facility], [place] [address], [place], [place] [address]           IMAGING    Dg Chest Portable 1 View  Result Date: 10/11/2019 CLINICAL DATA:  Status post CPR. EXAM: PORTABLE CHEST 1 VIEW COMPARISON:  10/01/2019 FINDINGS: 0647 hours. Endotracheal tube tip is 2.4 cm above the base of the carina. Cardiopericardial silhouette is at upper limits of normal for size. There is pulmonary vascular congestion with interstitial prominence suggesting edema. NG tube tip is positioned in the mid stomach. The visualized bony structures of the thorax are intact. Insert wire. IMPRESSION: 1. Endotracheal tube tip is 2.4 cm  above the base of the carina. 2. Pulmonary vascular congestion with interstitial edema. Electronically Signed   By: Misty Stanley M.D.   On: 10/11/2019 07:36        Indwelling Urinary Catheter continued, requirement due to   Reason to continue Indwelling Urinary Catheter strict Intake/Output monitoring for hemodynamic instability         Ventilator continued, requirement due to severe respiratory failure   Ventilator Sedation RASS 0 to -2      ASSESSMENT AND PLAN SYNOPSIS   Severe ACUTE Hypoxic and Hypercapnic Respiratory Failure from COVID 19 infection -continue Full MV support -continue Bronchodilator Therapy -Wean Fio2 and PEEP as tolerated   ACUTE CARDIAC FAILURE-sudden cardiac arrest Vent support     ESRD Renal Failure -follow chem 7 -follow UO -continue Foley Catheter-assess need -Avoid nephrotoxic agents   NEUROLOGY - intubated and sedated - minimal sedation to achieve a RASS goal: -1    SHOCK-CARDIOGENIC -use vasopressors to keep MAP>65 -follow ABG and LA   CARDIAC ICU monitoring  ID COVID 19 infection  GI GI PROPHYLAXIS as indicated  NUTRITIONAL STATUS DIET-->TF's as tolerated Constipation protocol as indicated   ENDO - will use ICU hypoglycemic\Hyperglycemia protocol if needed    ELECTROLYTES -follow labs as needed -replace as needed -pharmacy consultation and following   DVT/GI PRX ordered TRANSFUSIONS AS NEEDED MONITOR FSBS ASSESS the need for LABS    Critical Care Time devoted to patient care services described in this note is 45 minutes.   Overall, patient is critically ill, prognosis is guarded.  Patient with Multiorgan failure and at high risk for cardiac arrest and death.  Patient is in Dying process Patient is now DNR status Family Friends are ready to for comfort care measures when they arrive.  Corrin Parker, M.D.  [facility]  Medical Director [place] Director [facility] Cardio-Pulmonary Department

## 2019-10-11 NOTE — Progress Notes (Signed)
This RN, on a conference call with Dr. Theador Hawthorne, the patient's two friends [name] and [name]), along with the patient's NOK/nephew Thurmond Butts) dicussed the patient's need for HD and the goals of therapy. A decision was made by the patient's NOK, in agreement with the patient's two friends and the healthcare team, to forego further life-sustaining measures such as hemodialysis and pursue a route of comfort measures only: including extubation and cessation of other therapies. Dr. Mortimer Fries was notified of the family's decision.

## 2019-10-12 ENCOUNTER — Inpatient Hospital Stay: Payer: Medicare Other

## 2019-10-12 LAB — CBC
HCT: 26.8 % — ABNORMAL LOW (ref 36.0–46.0)
Hemoglobin: 8.6 g/dL — ABNORMAL LOW (ref 12.0–15.0)
MCH: 29.8 pg (ref 26.0–34.0)
MCHC: 32.1 g/dL (ref 30.0–36.0)
MCV: 92.7 fL (ref 80.0–100.0)
Platelets: 113 10*3/uL — ABNORMAL LOW (ref 150–400)
RBC: 2.89 MIL/uL — ABNORMAL LOW (ref 3.87–5.11)
RDW: 15.6 % — ABNORMAL HIGH (ref 11.5–15.5)
WBC: 8.6 10*3/uL (ref 4.0–10.5)
nRBC: 0 % (ref 0.0–0.2)

## 2019-10-12 LAB — PHOSPHORUS: Phosphorus: 3.4 mg/dL (ref 2.5–4.6)

## 2019-10-12 LAB — BASIC METABOLIC PANEL
Anion gap: 13 (ref 5–15)
BUN: 18 mg/dL (ref 8–23)
CO2: 26 mmol/L (ref 22–32)
Calcium: 8.7 mg/dL — ABNORMAL LOW (ref 8.9–10.3)
Chloride: 100 mmol/L (ref 98–111)
Creatinine, Ser: 3.62 mg/dL — ABNORMAL HIGH (ref 0.44–1.00)
GFR calc Af Amer: 14 mL/min — ABNORMAL LOW (ref >60)
GFR calc non Af Amer: 12 mL/min — ABNORMAL LOW (ref >60)
Glucose, Bld: 110 mg/dL — ABNORMAL HIGH (ref 70–99)
Potassium: 4.1 mmol/L (ref 3.5–5.1)
Sodium: 139 mmol/L (ref 135–145)

## 2019-10-12 LAB — MAGNESIUM: Magnesium: 1.7 mg/dL (ref 1.7–2.4)

## 2019-10-12 LAB — SARS CORONAVIRUS 2 BY RT PCR (HOSPITAL ORDER, PERFORMED IN ~~LOC~~ HOSPITAL LAB): SARS Coronavirus 2: NEGATIVE

## 2019-10-12 MED ORDER — FAMOTIDINE 20 MG PO TABS
20.0000 mg | ORAL_TABLET | Freq: Every day | ORAL | Status: DC
  Administered 2019-10-12 – 2019-10-13 (×2): 20 mg via ORAL
  Filled 2019-10-12 (×2): qty 1

## 2019-10-12 MED ORDER — OXYCODONE-ACETAMINOPHEN 5-325 MG PO TABS
1.0000 | ORAL_TABLET | Freq: Four times a day (QID) | ORAL | Status: DC | PRN
  Administered 2019-10-12 – 2019-10-13 (×4): 1 via ORAL
  Filled 2019-10-12 (×4): qty 1

## 2019-10-12 MED ORDER — OXYCODONE-ACETAMINOPHEN 7.5-325 MG PO TABS: 1.0000 | ORAL_TABLET | Freq: Four times a day (QID) | ORAL | Status: DC

## 2019-10-12 MED ORDER — BENZONATATE 100 MG PO CAPS
100.0000 mg | ORAL_CAPSULE | Freq: Two times a day (BID) | ORAL | Status: DC | PRN
  Administered 2019-10-12: 100 mg via ORAL
  Filled 2019-10-12: qty 1

## 2019-10-12 NOTE — Progress Notes (Signed)
Phyllis [name]  MRN: [id number]  DOB/AGE: 1945/12/14 73 y.o.  Primary Care Physician:Hande, Cherlyn Labella, MD  Admit date: 10/11/2019  Chief Complaint:  Chief Complaint  Patient presents with  . Respiratory Distress    S-Pt presented on  10/11/2019 with  Chief Complaint  Patient presents with  . Respiratory Distress  .    Pt today feels better.  Patient is alert follows commands responding appropriately  Medications . chlorhexidine gluconate (MEDLINE KIT)  15 mL Mouth Rinse BID  . Chlorhexidine Gluconate Cloth  6 each Topical Q0600  . [START ON 10/14/2019] epoetin (EPOGEN/PROCRIT) injection  2,000 Units Subcutaneous Q T,Th,Sa-HD  . famotidine  20 mg Oral Daily  . heparin  5,000 Units Subcutaneous Q8H  . mouth rinse  15 mL Mouth Rinse BID         YNW:GNFAO from the symptoms mentioned above,there are no other symptoms referable to all systems reviewed.  Physical Exam: Vital signs in last 24 hours: Temp:  [97.9 F (36.6 C)-99.9 F (37.7 C)] 99.7 F (37.6 C) (11/22 0800) Pulse Rate:  [63-95] 95 (11/22 0900) Resp:  [7-24] 24 (11/22 0900) BP: (128-178)/(41-74) 156/52 (11/22 0900) SpO2:  [91 %-100 %] 97 % (11/22 0900) FiO2 (%):  [40 %] 40 % (11/21 1352) Weight:  [59.1 kg] 59.1 kg (11/22 0500) Weight change: 0 kg Last BM Date: 10/08/19  Intake/Output from previous day: 11/21 0701 - 11/22 0700 In: 551.6 [I.V.:501.7; IV Piggyback:49.9] Out: 1415 [Urine:215; Emesis/NG output:200] Total I/O In: 280.1 [P.O.:240; I.V.:40.1] Out: 35 [Urine:35]   Physical Exam: General- pt is awake,alert, follows commands  resp- No acute REsp distress, decreased at bases CVS- S1S2 regular in rate and rhythm GIT- BS+, soft, NT, ND EXT- NO LE Edema, Cyanosis Access patient has AV fistula  Lab Results: CBC Recent Labs    10/11/19 0626 10/12/19 0411  WBC 12.4* 8.6  HGB 10.5* 8.6*  HCT 32.8* 26.8*  PLT 272 113*    BMET Recent Labs    10/11/19 0626 10/12/19 0411  NA  134* 139  K 5.0 4.1  CL 96* 100  CO2 22 26  GLUCOSE 222* 110*  BUN 28* 18  CREATININE 5.56* 3.62*  CALCIUM 8.5* 8.7*    MICRO Recent Results (from the past 240 hour(s))  SARS CORONAVIRUS 2 (TAT 6-24 HRS) Nasopharyngeal Nasopharyngeal Swab     Status: None   Collection Time: 10/04/19  5:13 PM   Specimen: Nasopharyngeal Swab  Result Value Ref Range Status   SARS Coronavirus 2 NEGATIVE NEGATIVE Final    Comment: (NOTE) SARS-CoV-2 target nucleic acids are NOT DETECTED. The SARS-CoV-2 RNA is generally detectable in upper and lower respiratory specimens during the acute phase of infection. Negative results do not preclude SARS-CoV-2 infection, do not rule out co-infections with other pathogens, and should not be used as the sole basis for treatment or other patient management decisions. Negative results must be combined with clinical observations, patient history, and epidemiological information. The expected result is Negative. Fact Sheet for Patients: SugarRoll.be Fact Sheet for Healthcare Providers: https://www.woods-mathews.com/ This test is not yet approved or cleared by the [place] FDA and  has been authorized for detection and/or diagnosis of SARS-CoV-2 by FDA under an Emergency Use Authorization (EUA). This EUA will remain  in effect (meaning this test can be used) for the duration of the COVID-19 declaration under Section 56 4(b)(1) of the Act, 21 U.S.C. section 360bbb-3(b)(1), unless the authorization is terminated or revoked sooner. Performed at [facility],  [address] [place], [place] [address]   SARS CORONAVIRUS 2 (TAT 6-24 HRS) Nasopharyngeal Nasopharyngeal Swab     Status: None   Collection Time: 10/07/19  3:26 PM   Specimen: Nasopharyngeal Swab  Result Value Ref Range Status   SARS Coronavirus 2 NEGATIVE NEGATIVE Final    Comment: (NOTE) SARS-CoV-2 target nucleic acids are NOT DETECTED. The SARS-CoV-2  RNA is generally detectable in upper and lower respiratory specimens during the acute phase of infection. Negative results do not preclude SARS-CoV-2 infection, do not rule out co-infections with other pathogens, and should not be used as the sole basis for treatment or other patient management decisions. Negative results must be combined with clinical observations, patient history, and epidemiological information. The expected result is Negative. Fact Sheet for Patients: SugarRoll.be Fact Sheet for Healthcare Providers: https://www.woods-mathews.com/ This test is not yet approved or cleared by the [place] FDA and  has been authorized for detection and/or diagnosis of SARS-CoV-2 by FDA under an Emergency Use Authorization (EUA). This EUA will remain  in effect (meaning this test can be used) for the duration of the COVID-19 declaration under Section 56 4(b)(1) of the Act, 21 U.S.C. section 360bbb-3(b)(1), unless the authorization is terminated or revoked sooner. Performed at [facility], [place] [address], [place], [place] [address]   SARS Coronavirus 2 by RT PCR (hospital order, performed in [facility] hospital lab) Nasopharyngeal Nasopharyngeal Swab     Status: None   Collection Time: 10/11/19  6:26 AM   Specimen: Nasopharyngeal Swab  Result Value Ref Range Status   SARS Coronavirus 2 NEGATIVE NEGATIVE Final    Comment: (NOTE) If result is NEGATIVE SARS-CoV-2 target nucleic acids are NOT DETECTED. The SARS-CoV-2 RNA is generally detectable in upper and lower  respiratory specimens during the acute phase of infection. The lowest  concentration of SARS-CoV-2 viral copies this assay can detect is 250  copies / mL. A negative result does not preclude SARS-CoV-2 infection  and should not be used as the sole basis for treatment or other  patient management decisions.  A negative result may occur with  improper specimen collection /  handling, submission of specimen other  than nasopharyngeal swab, presence of viral mutation(s) within the  areas targeted by this assay, and inadequate number of viral copies  (<250 copies / mL). A negative result must be combined with clinical  observations, patient history, and epidemiological information. If result is POSITIVE SARS-CoV-2 target nucleic acids are DETECTED. The SARS-CoV-2 RNA is generally detectable in upper and lower  respiratory specimens dur ing the acute phase of infection.  Positive  results are indicative of active infection with SARS-CoV-2.  Clinical  correlation with patient history and other diagnostic information is  necessary to determine patient infection status.  Positive results do  not rule out bacterial infection or co-infection with other viruses. If result is PRESUMPTIVE POSTIVE SARS-CoV-2 nucleic acids MAY BE PRESENT.   A presumptive positive result was obtained on the submitted specimen  and confirmed on repeat testing.  While 2019 novel coronavirus  (SARS-CoV-2) nucleic acids may be present in the submitted sample  additional confirmatory testing may be necessary for epidemiological  and / or clinical management purposes  to differentiate between  SARS-CoV-2 and other Sarbecovirus currently known to infect humans.  If clinically indicated additional testing with an alternate test  methodology [phone]) is advised. The SARS-CoV-2 RNA is generally  detectable in upper and lower respiratory sp ecimens during the acute  phase of infection. The expected result is Negative. Fact Sheet for Patients:  StrictlyIdeas.no Fact Sheet for Healthcare Providers: BankingDealers.co.za This test is not yet approved or cleared by the [place] FDA and has been authorized for detection and/or diagnosis of SARS-CoV-2 by FDA under an Emergency Use Authorization (EUA).  This EUA will remain in effect (meaning this  test can be used) for the duration of the COVID-19 declaration under Section 564(b)(1) of the Act, 21 U.S.C. section 360bbb-3(b)(1), unless the authorization is terminated or revoked sooner. Performed at [facility], [address] [place], [place] [address]   Culture, blood (routine x 2)     Status: None (Preliminary result)   Collection Time: 10/11/19  6:52 AM   Specimen: BLOOD  Result Value Ref Range Status   Specimen Description BLOOD BLOOD RIGHT HAND  Final   Special Requests   Final    BOTTLES DRAWN AEROBIC AND ANAEROBIC Blood Culture results may not be optimal due to an excessive volume of blood received in culture bottles   Culture   Final    NO GROWTH < 24 HOURS Performed at [facility], [address] [place], [place] [address]    Report Status PENDING  Incomplete  Culture, blood (routine x 2)     Status: None (Preliminary result)   Collection Time: 10/11/19  6:52 AM   Specimen: BLOOD  Result Value Ref Range Status   Specimen Description BLOOD LEFT ANTECUBITAL  Final   Special Requests   Final    BOTTLES DRAWN AEROBIC AND ANAEROBIC Blood Culture results may not be optimal due to an excessive volume of blood received in culture bottles   Culture  Setup Time   Final    GRAM POSITIVE COCCI AEROBIC BOTTLE ONLY CRITICAL RESULT CALLED TO, READ BACK BY AND VERIFIED WITH: JODY BAREFOOT AT 0981 10/12/2019.PMF Performed at [facility], [address] [place], [place] [address]    Culture [facility] POSITIVE COCCI  Final   Report Status PENDING  Incomplete  Culture, respiratory (tracheal aspirate)     Status: None (Preliminary result)   Collection Time: 10/11/19 10:57 AM   Specimen: Tracheal Aspirate; Respiratory  Result Value Ref Range Status   Specimen Description   Final    TRACHEAL ASPIRATE Performed at [facility], [address] [place], [place] [address]    Special Requests   Final    NONE Performed at [facility],  [address] [place], [place] [address]    Gram Stain NO WBC SEEN RARE GRAM POSITIVE COCCI IN PAIRS   Final   Culture   Final    CULTURE REINCUBATED FOR BETTER GROWTH Performed at [facility], [place] [address], [place], [place] [address]    Report Status PENDING  Incomplete      Lab Results  Component Value Date   PTH 160 (H) 05/24/2019   CALCIUM 8.7 (L) 10/12/2019   PHOS 3.4 10/12/2019               Impression:   Patient is a 73 year old Caucasian female with past medical history of end-stage renal disease, anemia of chronic disease, hypertension who was brought to the hospital after patient underwent cardiac arrest at the nursing home patient was successfully successfully reresuscitated intubated and brought to [facility] ER.  Patient was admitted to ICU.  Patient was later extubated and patient is now clinically stable  1)Renal  ESRD on HD Pt is on Tuesday/ Thursday/Saturday schedule Patient  was last dialyzed yesterday No need for hemodialysis today  2) cardiac arrest Patient is s/p cardiac arrest Pt ws successfully resuscitated  Patient is now stable   3)Anemia of chronic disease  HGb at goal (9--11)   4)  Secondary hyperparathyroidism CKD Mineral-Bone Disorder Secondary Hyperparathyroidism  Present. Phosphorus at goal.   5)Acute resp failure Patient is currently stable   6)Hyponatremia Secondary to ESRD No better after dialysis yesterday  7)Acid base Co2 at goal   8) Covid Patient is currently Covid negative.  See the lab results from October 11, 2019   Plan:   No need for hemodialysis today Will dialyze patient tomorrow/Tuesday depending upon patient's clinical status.    Manpreet s Theador Hawthorne 10/12/2019, 10:15 AM

## 2019-10-12 NOTE — Progress Notes (Signed)
PHARMACIST - PHYSICIAN COMMUNICATION  CONCERNING: IV to Oral Route Change Policy  RECOMMENDATION: This patient is receiving famotidine by the intravenous route.  Based on criteria approved by the Pharmacy and Therapeutics Committee, the intravenous medication(s) is/are being converted to the equivalent oral dose form(s).  DESCRIPTION: These criteria include:  The patient is eating (either orally or via tube) and/or has been taking other orally administered medications for a least 24 hours  The patient has no evidence of active gastrointestinal bleeding or impaired GI absorption (gastrectomy, short bowel, patient on TNA or NPO).  If you have questions about this conversion, please contact the Pharmacy Department [phone].   Phyllis [name],Phyllis [name], [facility] 10/12/2019 9:17 AM

## 2019-10-12 NOTE — Progress Notes (Signed)
SYNOPSIS  CC  Acute cardiac arrest and resp failure   HPI patient alert and awake Not on pressors Received HD yesterday    VITALS: BP (!) 145/41   Pulse 67   Temp 98.5 F (36.9 C) (Axillary)   Resp (!) 21   Ht 4\' 10"  (1.473 m)   Wt 59.1 kg   SpO2 98%   BMI 27.23 kg/m     I/O last 3 completed shifts: In: 551.6 [I.V.:501.7; IV Piggyback:49.9] Out: [id number] [Urine:215; Emesis/NG output:200; Other:1000] No intake/output data recorded.  SpO2: 98 % O2 Flow Rate (L/min): 3 L/min FiO2 (%): 40 %   Review of Systems:  Gen:  Denies  fever, sweats, chills weigh loss  HEENT: Denies blurred vision, double vision, ear pain, eye pain, hearing loss, nose bleeds, sore throat Cardiac:  No dizziness, chest pain or heaviness, chest tightness,edema, No JVD Resp:   No cough, -sputum production, +shortness of breath,-wheezing, -hemoptysis,  Gi: Denies swallowing difficulty, stomach pain, nausea or vomiting, diarrhea, constipation, bowel incontinence Gu:  Denies bladder incontinence, burning urine Ext:   Denies Joint pain, stiffness or swelling Skin: Denies  skin rash, easy bruising or bleeding or hives Endoc:  Denies polyuria, polydipsia , polyphagia or weight change Psych:   Denies depression, insomnia or hallucinations  Other:  All other systems negative  COVID-19 DISASTER DECLARATION:   FULL CONTACT PHYSICAL EXAMINATION WAS NOT POSSIBLE DUE TO TREATMENT OF COVID-19 AND   CONSERVATION OF PERSONAL PROTECTIVE EQUIPMENT, LIMITED EXAM FINDINGS INCLUDE-   Patient assessed or the symptoms described in the history of present illness.   In the context of the Global COVID-19 pandemic, which necessitated consideration that the patient might be at risk for infection with the SARS-CoV-2 virus that causes COVID-19, Institutional protocols and algorithms that pertain to the evaluation of patients at risk for COVID-19 are in a state of rapid change based on information released by regulatory bodies  including the CDC and federal and state organizations. These policies and algorithms were followed during the patient's care while in hospital.       I personally reviewed Labs under Results section.   MEDICATIONS: I have reviewed all medications and confirmed regimen as documented   CULTURE RESULTS   Recent Results (from the past 240 hour(s))  SARS CORONAVIRUS 2 (TAT 6-24 HRS) Nasopharyngeal Nasopharyngeal Swab     Status: None   Collection Time: 10/04/19  5:13 PM   Specimen: Nasopharyngeal Swab  Result Value Ref Range Status   SARS Coronavirus 2 NEGATIVE NEGATIVE Final    Comment: (NOTE) SARS-CoV-2 target nucleic acids are NOT DETECTED. The SARS-CoV-2 RNA is generally detectable in upper and lower respiratory specimens during the acute phase of infection. Negative results do not preclude SARS-CoV-2 infection, do not rule out co-infections with other pathogens, and should not be used as the sole basis for treatment or other patient management decisions. Negative results must be combined with clinical observations, patient history, and epidemiological information. The expected result is Negative. Fact Sheet for Patients: SugarRoll.be Fact Sheet for Healthcare Providers: https://www.woods-mathews.com/ This test is not yet approved or cleared by the [place] FDA and  has been authorized for detection and/or diagnosis of SARS-CoV-2 by FDA under an Emergency Use Authorization (EUA). This EUA will remain  in effect (meaning this test can be used) for the duration of the COVID-19 declaration under Section 56 4(b)(1) of the Act, 21 U.S.C. section 360bbb-3(b)(1), unless the authorization is terminated or revoked sooner. Performed at [facility],  [address], [place], [place] [address]   SARS CORONAVIRUS 2 (TAT 6-24 HRS) Nasopharyngeal Nasopharyngeal Swab     Status: None   Collection Time: 10/07/19  3:26 PM   Specimen:  Nasopharyngeal Swab  Result Value Ref Range Status   SARS Coronavirus 2 NEGATIVE NEGATIVE Final    Comment: (NOTE) SARS-CoV-2 target nucleic acids are NOT DETECTED. The SARS-CoV-2 RNA is generally detectable in upper and lower respiratory specimens during the acute phase of infection. Negative results do not preclude SARS-CoV-2 infection, do not rule out co-infections with other pathogens, and should not be used as the sole basis for treatment or other patient management decisions. Negative results must be combined with clinical observations, patient history, and epidemiological information. The expected result is Negative. Fact Sheet for Patients: SugarRoll.be Fact Sheet for Healthcare Providers: https://www.woods-mathews.com/ This test is not yet approved or cleared by the [place] FDA and  has been authorized for detection and/or diagnosis of SARS-CoV-2 by FDA under an Emergency Use Authorization (EUA). This EUA will remain  in effect (meaning this test can be used) for the duration of the COVID-19 declaration under Section 56 4(b)(1) of the Act, 21 U.S.C. section 360bbb-3(b)(1), unless the authorization is terminated or revoked sooner. Performed at [facility], [place] [address] [place],  [address]   SARS Coronavirus 2 by RT PCR (hospital order, performed in [facility] hospital lab) Nasopharyngeal Nasopharyngeal Swab     Status: None   Collection Time: 10/11/19  6:26 AM   Specimen: Nasopharyngeal Swab  Result Value Ref Range Status   SARS Coronavirus 2 NEGATIVE NEGATIVE Final    Comment: (NOTE) If result is NEGATIVE SARS-CoV-2 target nucleic acids are NOT DETECTED. The SARS-CoV-2 RNA is generally detectable in upper and lower  respiratory specimens during the acute phase of infection. The lowest  concentration of SARS-CoV-2 viral copies this assay can detect is 250  copies / mL. A negative result does not preclude  SARS-CoV-2 infection  and should not be used as the sole basis for treatment or other  patient management decisions.  A negative result may occur with  improper specimen collection / handling, submission of specimen other  than nasopharyngeal swab, presence of viral mutation(s) within the  areas targeted by this assay, and inadequate number of viral copies  (<250 copies / mL). A negative result must be combined with clinical  observations, patient history, and epidemiological information. If result is POSITIVE SARS-CoV-2 target nucleic acids are DETECTED. The SARS-CoV-2 RNA is generally detectable in upper and lower  respiratory specimens dur ing the acute phase of infection.  Positive  results are indicative of active infection with SARS-CoV-2.  Clinical  correlation with patient history and other diagnostic information is  necessary to determine patient infection status.  Positive results do  not rule out bacterial infection or co-infection with other viruses. If result is PRESUMPTIVE POSTIVE SARS-CoV-2 nucleic acids MAY BE PRESENT.   A presumptive positive result was obtained on the submitted specimen  and confirmed on repeat testing.  While 2019 novel coronavirus  (SARS-CoV-2) nucleic acids may be present in the submitted sample  additional confirmatory testing may be necessary for epidemiological  and / or clinical management purposes  to differentiate between  SARS-CoV-2 and other Sarbecovirus currently known to infect humans.  If clinically indicated additional testing with an alternate test  methodology [phone]) is advised. The SARS-CoV-2 RNA is generally  detectable in upper and lower respiratory sp ecimens during the acute  phase of infection. The expected result is Negative. Fact Sheet for Patients:  StrictlyIdeas.no Fact Sheet for Healthcare Providers: BankingDealers.co.za This test is not yet approved or cleared by the  [place] FDA and has been authorized for detection and/or diagnosis of SARS-CoV-2 by FDA under an Emergency Use Authorization (EUA).  This EUA will remain in effect (meaning this test can be used) for the duration of the COVID-19 declaration under Section 564(b)(1) of the Act, 21 U.S.C. section 360bbb-3(b)(1), unless the authorization is terminated or revoked sooner. Performed at [facility], [address] [place], [place] [address]   Culture, blood (routine x 2)     Status: None (Preliminary result)   Collection Time: 10/11/19  6:52 AM   Specimen: BLOOD  Result Value Ref Range Status   Specimen Description BLOOD BLOOD RIGHT HAND  Final   Special Requests   Final    BOTTLES DRAWN AEROBIC AND ANAEROBIC Blood Culture results may not be optimal due to an excessive volume of blood received in culture bottles   Culture   Final    NO GROWTH < 24 HOURS Performed at [facility], [address], [place], [place] [address]    Report Status PENDING  Incomplete  Culture, blood (routine x 2)     Status: None (Preliminary result)   Collection Time: 10/11/19  6:52 AM   Specimen: BLOOD  Result Value Ref Range Status   Specimen Description BLOOD LEFT ANTECUBITAL  Final   Special Requests   Final    BOTTLES DRAWN AEROBIC AND ANAEROBIC Blood Culture results may not be optimal due to an excessive volume of blood received in culture bottles   Culture  Setup Time   Final    GRAM POSITIVE COCCI AEROBIC BOTTLE ONLY CRITICAL RESULT CALLED TO, READ BACK BY AND VERIFIED WITH: JODY BAREFOOT AT [id number] 10/12/2019.PMF Performed at [facility], [address] [place], [place] [address]    Culture [facility] POSITIVE COCCI  Final   Report Status PENDING  Incomplete  Culture, respiratory (tracheal aspirate)     Status: None (Preliminary result)   Collection Time: 10/11/19 10:57 AM   Specimen: Tracheal Aspirate; Respiratory  Result Value Ref Range Status   Specimen Description    Final    TRACHEAL ASPIRATE Performed at [facility], [address] [place], [place] [address]    Special Requests   Final    NONE Performed at [facility], [address] [place], [place] [address]    Gram Stain   Final    NO WBC SEEN RARE IN PAIRS Performed at [facility], [place] [address] [place],  [address]    Culture PENDING  Incomplete   Report Status PENDING  Incomplete          IMAGING    Dg Chest Port 1 View  Result Date: 10/12/2019 CLINICAL DATA:  Respiratory failure EXAM: PORTABLE CHEST 1 VIEW COMPARISON:  10/11/2019, 10/01/2019, 09/24/2019 FINDINGS: Removal of endotracheal tube. Low lung volumes. Patchy focus of atelectasis at the left base. Borderline to mild cardiomegaly. Aortic atherosclerosis. No pneumothorax. Removal of esophageal tube. IMPRESSION: 1. Removal of endotracheal and esophageal tubes. 2. Low lung volumes with atelectasis at the left base. Mild cardiomegaly. Electronically Signed   By: Donavan Foil M.D.   On: 10/12/2019 01:00        ASSESSMENT AND PLAN SYNOPSIS  73 yo WF admitted for acute cardiac arrest and resp failure from acute pulmonary edema from End  stage Renal disease  Acute cardiac arrest-resolved Resp failure-resolved ESRD on HD follow up Nephrology recs Oxygen as needed Diet as tolerated  Step down status. Plan to transfer to [facility] tomorrow.  Patient is DNR/DNI   Corrin Parker, M.D.  [facility]  Medical Director [place] Director [facility] Cardio-Pulmonary Department

## 2019-10-13 ENCOUNTER — Institutional Professional Consult (permissible substitution): Payer: Medicare Other | Admitting: Cardiovascular Disease

## 2019-10-13 ENCOUNTER — Encounter: Payer: Self-pay | Admitting: Physician Assistant

## 2019-10-13 DIAGNOSIS — I350 Nonrheumatic aortic (valve) stenosis: Secondary | ICD-10-CM

## 2019-10-13 DIAGNOSIS — Z888 Allergy status to other drugs, medicaments and biological substances status: Secondary | ICD-10-CM

## 2019-10-13 DIAGNOSIS — R011 Cardiac murmur, unspecified: Secondary | ICD-10-CM

## 2019-10-13 DIAGNOSIS — Z992 Dependence on renal dialysis: Secondary | ICD-10-CM

## 2019-10-13 DIAGNOSIS — Z9889 Other specified postprocedural states: Secondary | ICD-10-CM

## 2019-10-13 DIAGNOSIS — Z1159 Encounter for screening for other viral diseases: Secondary | ICD-10-CM

## 2019-10-13 DIAGNOSIS — Z8674 Personal history of sudden cardiac arrest: Secondary | ICD-10-CM

## 2019-10-13 DIAGNOSIS — N186 End stage renal disease: Secondary | ICD-10-CM

## 2019-10-13 DIAGNOSIS — Z881 Allergy status to other antibiotic agents status: Secondary | ICD-10-CM

## 2019-10-13 DIAGNOSIS — I5033 Acute on chronic diastolic (congestive) heart failure: Secondary | ICD-10-CM

## 2019-10-13 DIAGNOSIS — I132 Hypertensive heart and chronic kidney disease with heart failure and with stage 5 chronic kidney disease, or end stage renal disease: Principal | ICD-10-CM

## 2019-10-13 DIAGNOSIS — J810 Acute pulmonary edema: Secondary | ICD-10-CM

## 2019-10-13 DIAGNOSIS — D631 Anemia in chronic kidney disease: Secondary | ICD-10-CM

## 2019-10-13 DIAGNOSIS — J9601 Acute respiratory failure with hypoxia: Secondary | ICD-10-CM

## 2019-10-13 DIAGNOSIS — D649 Anemia, unspecified: Secondary | ICD-10-CM

## 2019-10-13 LAB — CULTURE, RESPIRATORY W GRAM STAIN
Culture: NORMAL
Gram Stain: NONE SEEN

## 2019-10-13 LAB — RENAL FUNCTION PANEL
Albumin: 3.1 g/dL — ABNORMAL LOW (ref 3.5–5.0)
Anion gap: 13 (ref 5–15)
BUN: 34 mg/dL — ABNORMAL HIGH (ref 8–23)
CO2: 26 mmol/L (ref 22–32)
Calcium: 8.2 mg/dL — ABNORMAL LOW (ref 8.9–10.3)
Chloride: 97 mmol/L — ABNORMAL LOW (ref 98–111)
Creatinine, Ser: 5.04 mg/dL — ABNORMAL HIGH (ref 0.44–1.00)
GFR calc Af Amer: 9 mL/min — ABNORMAL LOW (ref >60)
GFR calc non Af Amer: 8 mL/min — ABNORMAL LOW (ref >60)
Glucose, Bld: 83 mg/dL (ref 70–99)
Phosphorus: 4.1 mg/dL (ref 2.5–4.6)
Potassium: 4.3 mmol/L (ref 3.5–5.1)
Sodium: 136 mmol/L (ref 135–145)

## 2019-10-13 LAB — GLUCOSE, CAPILLARY
Glucose-Capillary: 75 mg/dL (ref 70–99)
Glucose-Capillary: 110 mg/dL — ABNORMAL HIGH (ref 70–99)
Glucose-Capillary: 102 mg/dL — ABNORMAL HIGH (ref 70–99)
Glucose-Capillary: 121 mg/dL — ABNORMAL HIGH (ref 70–99)

## 2019-10-13 LAB — CULTURE, BLOOD (ROUTINE X 2)

## 2019-10-13 LAB — MAGNESIUM: Magnesium: 1.9 mg/dL (ref 1.7–2.4)

## 2019-10-13 MED ORDER — PRAVASTATIN SODIUM 20 MG PO TABS
40.0000 mg | ORAL_TABLET | Freq: Every day | ORAL | Status: DC
  Administered 2019-10-13: 40 mg via ORAL
  Filled 2019-10-13: qty 2

## 2019-10-13 MED ORDER — CLOPIDOGREL BISULFATE 75 MG PO TABS
75.0000 mg | ORAL_TABLET | Freq: Every day | ORAL | Status: DC
  Administered 2019-10-13: 75 mg via ORAL
  Filled 2019-10-13: qty 1

## 2019-10-13 MED ORDER — HYDRALAZINE HCL 50 MG PO TABS
50.0000 mg | ORAL_TABLET | Freq: Three times a day (TID) | ORAL | Status: DC
  Administered 2019-10-13 – 2019-10-14 (×3): 50 mg via ORAL
  Filled 2019-10-13 (×3): qty 1

## 2019-10-13 MED ORDER — TORSEMIDE 20 MG PO TABS
40.0000 mg | ORAL_TABLET | ORAL | Status: DC
  Administered 2019-10-13: 40 mg via ORAL
  Filled 2019-10-13: qty 2

## 2019-10-13 MED ORDER — AMLODIPINE BESYLATE 10 MG PO TABS
10.0000 mg | ORAL_TABLET | Freq: Every day | ORAL | Status: DC
  Administered 2019-10-13: 10 mg via ORAL
  Filled 2019-10-13: qty 1

## 2019-10-13 MED ORDER — HYDROMORPHONE HCL 1 MG/ML IJ SOLN
0.5000 mg | Freq: Four times a day (QID) | INTRAMUSCULAR | Status: DC | PRN
  Administered 2019-10-13 – 2019-10-14 (×3): 0.5 mg via INTRAVENOUS
  Filled 2019-10-13 (×3): qty 1

## 2019-10-13 MED ORDER — LIDOCAINE 5 % EX PTCH: 1.0000 | MEDICATED_PATCH | CUTANEOUS | 0 refills | Status: AC

## 2019-10-13 MED ORDER — LOSARTAN POTASSIUM 50 MG PO TABS
50.0000 mg | ORAL_TABLET | Freq: Every day | ORAL | Status: DC
  Administered 2019-10-13: 50 mg via ORAL
  Filled 2019-10-13: qty 1

## 2019-10-13 MED ORDER — LIDOCAINE 5 % EX PTCH
1.0000 | MEDICATED_PATCH | CUTANEOUS | Status: DC
  Administered 2019-10-13: 1 via TRANSDERMAL
  Filled 2019-10-13 (×2): qty 1

## 2019-10-13 MED ORDER — ONDANSETRON HCL 4 MG/2ML IJ SOLN
4.0000 mg | Freq: Four times a day (QID) | INTRAMUSCULAR | Status: DC | PRN
  Administered 2019-10-13 – 2019-10-14 (×3): 4 mg via INTRAVENOUS
  Filled 2019-10-13 (×3): qty 2

## 2019-10-13 MED ORDER — CARVEDILOL 12.5 MG PO TABS
12.5000 mg | ORAL_TABLET | Freq: Two times a day (BID) | ORAL | Status: DC
  Administered 2019-10-13 – 2019-10-14 (×2): 12.5 mg via ORAL
  Filled 2019-10-13 (×2): qty 1

## 2019-10-13 MED ORDER — CALCIUM ACETATE (PHOS BINDER) 667 MG PO CAPS
1334.0000 mg | ORAL_CAPSULE | Freq: Three times a day (TID) | ORAL | Status: DC
  Administered 2019-10-13 (×2): 1334 mg via ORAL
  Filled 2019-10-13 (×5): qty 2

## 2019-10-13 NOTE — NC FL2 (Signed)
[place] LEVEL OF CARE SCREENING TOOL     IDENTIFICATION  [name] Birthdate: [birth year] Sex: female Admission Date (Current Location): 10/11/2019  [place] and [place] Number:  Engineering geologist and Address:  [facility], [address], [place], [place] [address]      Provider Number: [id number]  Attending Physician Name and Address:  Flora Lipps, MD  Relative Name and Phone Number:       Current Level of Care: Hospital Recommended Level of Care: [place] Prior Approval Number:    Date Approved/Denied:   PASRR Number: [id number]  Discharge Plan: SNF    Current Diagnoses: Patient Active Problem List   Diagnosis Date Noted  . Respiratory failure with hypoxia ([place]) 10/11/2019  . Acute on chronic anemia   . Acute on chronic respiratory failure with hypoxia ([place]) 10/01/2019  . Aortic valve stenosis   . Acute respiratory failure with hypoxemia ([place]) 09/20/2019  . COPD with acute bronchitis ([place]) 09/05/2019  . Congestive heart failure ([place]) 05/24/2019  . Hyperkalemia 11/29/2018  . Chronic diastolic heart failure ([place]) 11/02/2018  . HTN (hypertension) 11/02/2018  . Uncontrolled hypertension 10/21/2018  . Pressure injury of skin 03/16/2018  . CVA (cerebral vascular accident) ([place]) 01/16/2018  . Aortic atherosclerosis ([place]) 12/11/2017  . Chest pain 09/18/2017  . Hyperlipidemia 08/06/2015  . Complication from renal dialysis device 04/12/2015  . SOB (shortness of breath) 02/01/2015  . End stage renal disease on dialysis ([place]) 02/01/2015  . Type 2 diabetes mellitus with other specified complication ([place]) [id number]  . Asthma 02/01/2015  . Acute on chronic diastolic CHF (congestive heart failure) ([place]) 02/01/2015    Orientation RESPIRATION BLADDER Height & Weight     Self, Time, Situation, Place  O2(Nasal Canula 1 L) Continent Weight: 141 lb 1.5 oz (64 kg) Height:  4\' 10"  (147.3 cm)   BEHAVIORAL SYMPTOMS/MOOD NEUROLOGICAL BOWEL NUTRITION STATUS  (None) (None) Continent Diet(Renal/carb modified.)  AMBULATORY STATUS COMMUNICATION OF NEEDS Skin   Extensive Assist Verbally Bruising, Other (Comment)(Excoriated, MASD.)                       Personal Care Assistance Level of Assistance  Bathing, Feeding, Dressing Bathing Assistance: Maximum assistance Feeding assistance: Limited assistance Dressing Assistance: Maximum assistance     Functional Limitations Info  Sight, Speech, Hearing Sight Info: Adequate Hearing Info: Adequate Speech Info: Adequate    SPECIAL CARE FACTORS FREQUENCY  PT (By licensed PT), OT (By licensed OT)     PT Frequency: 5 x week OT Frequency: 5 x week            Contractures Contractures Info: Not present    Additional Factors Info  Code Status, Allergies Code Status Info: DNR Allergies Info: Enalapril Maleate, Nitrofurantoin, Sulfamethoxazole-trimethoprim, 2,4-d Dimethylamine (Amisol), Baclofen, Neosporin (Neomycin-bacitracin Zn-polymyx), Quinine, Ultram (Tramadol), Zocor (Simvastatin), Bactrim (Sulfamethoxazole-trimethoprim), Levodopa, Macrodantin (Nitrofurantoin Macrocrystal), Quinine Derivatives           Current Medications (10/13/2019):  This is the current hospital active medication list Current Facility-Administered Medications  Medication Dose Route Frequency Provider Last Rate Last Dose  . 0.9 %  sodium chloride infusion   Intravenous PRN Flora Lipps, MD 10 mL/hr at 10/11/19 1300    . 0.9 %  sodium chloride infusion  100 mL Intravenous PRN Bhutani, Manpreet S, MD      . 0.9 %  sodium chloride infusion  100 mL Intravenous PRN Theador Hawthorne, Lavina Hamman, MD      .  acetaminophen (TYLENOL) tablet 650 mg  650 mg Oral Q6H PRN Flora Lipps, MD       Or  . acetaminophen (TYLENOL) suppository 650 mg  650 mg Rectal Q6H PRN Flora Lipps, MD      . acetaminophen (TYLENOL) tablet 650 mg  650 mg Oral Q4H PRN Tukov-Yual, Magdalene S, NP       . alteplase (CATHFLO ACTIVASE) injection 2 mg  2 mg Intracatheter Once PRN Bhutani, Manpreet S, MD      . amLODipine (NORVASC) tablet 10 mg  10 mg Oral Daily Flora Lipps, MD   10 mg at 10/13/19 1139  . benzonatate (TESSALON) capsule 100 mg  100 mg Oral BID PRN Lang Snow, NP   100 mg at 10/12/19 2135  . calcium acetate (PHOSLO) capsule 1,334 mg  1,334 mg Oral TID WC Flora Lipps, MD   1,334 mg at 10/13/19 1138  . carvedilol (COREG) tablet 12.5 mg  12.5 mg Oral BID WC Flora Lipps, MD      . Chlorhexidine Gluconate Cloth 2 % PADS 6 each  6 each Topical Q0600 Liana Gerold, MD   6 each at 10/13/19 1141  . clopidogrel (PLAVIX) tablet 75 mg  75 mg Oral Daily Flora Lipps, MD   75 mg at 10/13/19 1139  . dextrose 5 % solution   Intravenous Continuous Flora Lipps, MD 20 mL/hr at 10/13/19 1200    . diphenhydrAMINE (BENADRYL) injection 25 mg  25 mg Intravenous Q4H PRN Flora Lipps, MD      . Derrill Memo ON 10/14/2019] epoetin alfa (EPOGEN) injection 2,000 Units  2,000 Units Subcutaneous Q T,Th,Sa-HD Bhutani, Manpreet S, MD      . famotidine (PEPCID) tablet 20 mg  20 mg Oral Daily Charlett Nose, RPH   20 mg at 10/13/19 1139  . glycopyrrolate (ROBINUL) tablet 1 mg  1 mg Oral Q4H PRN Flora Lipps, MD       Or  . glycopyrrolate (ROBINUL) injection 0.2 mg  0.2 mg Subcutaneous Q4H PRN Flora Lipps, MD       Or  . glycopyrrolate (ROBINUL) injection 0.2 mg  0.2 mg Intravenous Q4H PRN Flora Lipps, MD      . heparin injection 1,000 Units  1,000 Units Dialysis PRN Bhutani, Manpreet S, MD      . heparin injection 5,000 Units  5,000 Units Subcutaneous Q8H Tukov-Yual, Magdalene S, NP   5,000 Units at 10/13/19 1307  . hydrALAZINE (APRESOLINE) tablet 50 mg  50 mg Oral Q8H Flora Lipps, MD   50 mg at 10/13/19 1305  . HYDROmorphone (DILAUDID) injection 0.5 mg  0.5 mg Intravenous Q6H PRN Flora Lipps, MD   0.5 mg at 10/13/19 1311  . ipratropium-albuterol (DUONEB) 0.5-2.5 (3) MG/3ML nebulizer  solution 3 mL  3 mL Nebulization Q6H PRN Tukov-Yual, Magdalene S, NP      . lidocaine (LIDODERM) 5 % 1 patch  1 patch Transdermal Q24H Flora Lipps, MD   1 patch at 10/13/19 1136  . lidocaine (PF) (XYLOCAINE) 1 % injection 5 mL  5 mL Intradermal PRN Bhutani, Manpreet S, MD      . lidocaine-prilocaine (EMLA) cream 1 application  1 application Topical PRN Bhutani, Manpreet S, MD      . losartan (COZAAR) tablet 50 mg  50 mg Oral Daily Flora Lipps, MD   50 mg at 10/13/19 1139  . ondansetron (ZOFRAN) injection 4 mg  4 mg Intravenous Q6H PRN Flora Lipps, MD   4 mg at 10/13/19 1304  .  oxyCODONE-acetaminophen (PERCOCET/ROXICET) 5-325 MG per tablet 1 tablet  1 tablet Oral Q6H PRN Flora Lipps, MD   1 tablet at 10/13/19 0800  . pentafluoroprop-tetrafluoroeth (GEBAUERS) aerosol 1 application  1 application Topical PRN Bhutani, Manpreet S, MD      . polyvinyl alcohol (LIQUIFILM TEARS) 1.4 % ophthalmic solution 1 drop  1 drop Both Eyes QID PRN Flora Lipps, MD      . pravastatin (PRAVACHOL) tablet 40 mg  40 mg Oral [id number] Flora Lipps, MD      . torsemide (DEMADEX) tablet 40 mg  40 mg Oral Once per day on Sun Mon Wed Fri Flora Lipps, MD         Discharge Medications: Please see discharge summary for a list of discharge medications.  Relevant Imaging Results:  Relevant Lab Results:   Additional Information SS#: [id number]. [facility] TTS 11:45.  Candie Chroman, LCSW

## 2019-10-13 NOTE — Consult Note (Signed)
NAME: Phyllis [name]  DOB: 1945-12-29  MRN: [id number]  Date/Time: 10/13/2019 11:20 AM : REQUESTING PROVIDER: Dr. Callie Fielding Subjective:  REASON FOR CONSULT: r/o To interpret Covid antigen test ? Phyllis [name] is a 73 y.o. female with a history of end-stage renal disease, anemia of chronic disease, hypertension was hospitalized on 10/11/2019 after having a cardiac arrest at nursing home and was successfully resuscitated and bagged and brought to [facility] ER.  As per EMS patient is a resident of [facility] and the staff said that the patient had complained of chest pain and shortness of breath and they  gave her a breathing treatment and was calling the on-call doctor when they came back to assess her and she was unresponsive.  They started resuscitation and when EMS arrived patient was pink, warm and dry.  There was agonal respiration it the EMS tried to put an OPA but she was clenching.  On auscultation of the chest patient had wheezing but however no rales.  The establish peripheral line and successfully backed off with oxygen for oxygen and brought her to the ED.  In the ED her vitals were blood pressure of 161/83, heart rate of 93, respiratory rate of 22, pulse ox of 100%.  She was unresponsive.  She was intubated by the ED physician.  At 6:40 AM on 10/11/2019. Labs revealed a hemoglobin of 10.5, lactate of 4.8, WBC of 12.4.  Glucose was 222, A point-of-care SARS-CoV-2 antigen test was done at bedside that came back positive.  SARS COV 2 RT-PCR was sent which has come back negative.  Patient underwent a Covid test on 10/11/2019 with RT-PCR and it was negative prior to that was 10/07/2027 was -11 4020 was - 10/01/2019 was negativeAnd 09/20/2019 was also negative.  SARS coronavirus antigen was sent and that came back as positive. Patient has severe aortic stenosis and has had flash pulmonary edema prior and has been hospitalized for the same needing urgent dialysis.  She was recently hospitalized  between 10/01/2019 and 10/08/2019 with  acute hypoxic respiratory failure due to flash pulmonary edema due to acute on chronic diastolic CHF in setting of severe aortic stenosis.  And it was thought because of the aortic stenosis and dialysis dependence she has a very labile and challenging fluid status.  She underwent dialysis in the hospital and was immediately comfortable after that.  Acute shortness of breath.  She was also transfused 1 unit of blood during that hospitalization. Pt has been extubated and is playing card games on her IPAD.  No sob, no cough Says she has chest pain at the site of CPR No fever Past Medical History:  Diagnosis Date  . (HFpEF) heart failure with preserved ejection fraction ([place])    a. TTE 01/2014: nl LV sys fxn, no valvular abnormalities; b. TTE 11/16: nl EF, mild LVH;  c. 04/2019 Echo: EF 60-65%. DD. Nl RV fxn. Mod AS. 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  . Allergy   . Anemia of chronic disease   . Anxiety   . Aortic atherosclerosis ([place])   . Asthma   . Chronic back pain   . COPD (chronic obstructive pulmonary disease) ([place])   . Diabetes mellitus with complication ([place])   . ESRD on hemodialysis ([place])    a. Tues/Sat; b. 2/2 small kidneys  . Essential hypertension   . Fistula    lower left arm  . GERD (gastroesophageal reflux disease)   . Gout   . History  of exercise stress test    a. 01/2014: no evidence of ischemia; b. Lexiscan 08/2015: no sig ischemia, severe GI uptake artifact, low risk; c. CPET @ [facility] 09/2016: exercised 3 min 12 sec on bike without incline, 2.28 METs, VO2 of 8.1, 48% of predicted, indicating mod to sev functional impairment, evidence of blunted HR, stroke volume, and BP augmentation as well as ventilation-perfusion mismatch with exercise  . HLD (hyperlipidemia)   . Moderate aortic stenosis    a. 04/2019 Echo: Mod AS.  . Non-obstructive Carotid arterial disease ([place])    a. 12/2017: <50% bilat ICA dzs.  Marland Kitchen Permanent central  venous catheter in place    right chest  . Sleep apnea     Past Surgical History:  Procedure Laterality Date  . carpel tunnel    . GALLBLADDER SURGERY    . PERIPHERAL VASCULAR CATHETERIZATION N/A 04/12/2015   Procedure: A/V Shuntogram/Fistulagram;  Surgeon: Algernon Huxley, MD;  Location: [place] [facility];  Service: Cardiovascular;  Laterality: N/A;  . PERIPHERAL VASCULAR CATHETERIZATION N/A 04/12/2015   Procedure: A/V Shunt Intervention;  Surgeon: Algernon Huxley, MD;  Location: [place] [facility];  Service: Cardiovascular;  Laterality: N/A;  . PERIPHERAL VASCULAR CATHETERIZATION N/A 06/09/2015   Procedure: Dialysis/Perma Catheter Removal;  Surgeon: Katha Cabal, MD;  Location: [place] [facility];  Service: Cardiovascular;  Laterality: N/A;    Social History   Socioeconomic History  . Marital status: Single    Spouse name: Not on file  . Number of children: Not on file  . Years of education: Not on file  . Highest education level: Not on file  Occupational History  . Not on file  Social Needs  . Financial resource strain: Not on file  . Food insecurity    Worry: Not on file    Inability: Not on file  . Transportation needs    Medical: Not on file    Non-medical: Not on file  Tobacco Use  . Smoking status: Never Smoker  . Smokeless tobacco: Never Used  Substance and Sexual Activity  . Alcohol use: No  . Drug use: No  . Sexual activity: Not Currently  Lifestyle  . Physical activity    Days per week: Not on file    Minutes per session: Not on file  . Stress: Not on file  Relationships  . Social Herbalist on phone: Not on file    Gets together: Not on file    Attends religious service: Not on file    Active member of club or organization: Not on file    Attends meetings of clubs or organizations: Not on file    Relationship status: Not on file  . Intimate partner violence    Fear of current or ex partner: Not on file    Emotionally abused: Not on file     Physically abused: Not on file    Forced sexual activity: Not on file  Other Topics Concern  . Not on file  Social History Narrative  . Not on file    Family History  Problem Relation Age of Onset  . Hypertension Mother   . Hyperlipidemia Mother   . Heart disease Father   . Heart attack Father 38  . Hypertension Father   . Hyperlipidemia Father   . Heart disease Brother        CABG   . Heart attack Brother   . Breast cancer Neg Hx  Allergies  Allergen Reactions  . Enalapril Maleate Other (See Comments)    Other reaction(s): Headache  . Nitrofurantoin Swelling and Rash    Other Reaction: swelling of body  . Sulfamethoxazole-Trimethoprim Swelling  . 2,4-D Dimethylamine (Amisol) Rash and Other (See Comments)    Other Reaction: h/a  . Baclofen Other (See Comments) and Nausea Only    lightheadness ,drowsiness , muscle weakness , twitching in hands   . Neosporin [Neomycin-Bacitracin Zn-Polymyx] Other (See Comments) and Rash    Other Reaction: irritation Skin irritation   . Quinine Nausea And Vomiting, Rash and Other (See Comments)    Other Reaction: Vomiting, rash, h/a, vision  . Ultram [Tramadol] Palpitations  . Zocor [Simvastatin] Other (See Comments) and Rash    Other Reaction: muscle spasms Muscle pain and spasms  . Bactrim [Sulfamethoxazole-Trimethoprim] Swelling  . Levodopa Other (See Comments)    Reaction: unknown  . Macrodantin [Nitrofurantoin Macrocrystal] Swelling  . Quinine Derivatives Other (See Comments)    Vertigo,nausea vomiting blurred vision headache ears sensitivity     Current Facility-Administered Medications  Medication Dose Route Frequency Provider Last Rate Last Dose  . 0.9 %  sodium chloride infusion   Intravenous PRN Flora Lipps, MD 10 mL/hr at 10/11/19 1300    . 0.9 %  sodium chloride infusion  100 mL Intravenous PRN Bhutani, Manpreet S, MD      . 0.9 %  sodium chloride infusion  100 mL Intravenous PRN Bhutani, Manpreet S, MD      .  acetaminophen (TYLENOL) tablet 650 mg  650 mg Oral Q6H PRN Flora Lipps, MD       Or  . acetaminophen (TYLENOL) suppository 650 mg  650 mg Rectal Q6H PRN Flora Lipps, MD      . acetaminophen (TYLENOL) tablet 650 mg  650 mg Oral Q4H PRN Tukov-Yual, Magdalene S, NP      . alteplase (CATHFLO ACTIVASE) injection 2 mg  2 mg Intracatheter Once PRN Bhutani, Manpreet S, MD      . amLODipine (NORVASC) tablet 10 mg  10 mg Oral Daily Kasa, Kurian, MD      . benzonatate (TESSALON) capsule 100 mg  100 mg Oral BID PRN Lang Snow, NP   100 mg at 10/12/19 2135  . calcium acetate (PHOSLO) capsule 1,334 mg  1,334 mg Oral TID WC Flora Lipps, MD      . carvedilol (COREG) tablet 12.5 mg  12.5 mg Oral BID WC Flora Lipps, MD      . Chlorhexidine Gluconate Cloth 2 % PADS 6 each  6 each Topical Q0600 Liana Gerold, MD   6 each at 10/12/19 1030  . clopidogrel (PLAVIX) tablet 75 mg  75 mg Oral Daily Kasa, Kurian, MD      . dextrose 5 % solution   Intravenous Continuous Flora Lipps, MD 20 mL/hr at 10/13/19 1100    . diphenhydrAMINE (BENADRYL) injection 25 mg  25 mg Intravenous Q4H PRN Flora Lipps, MD      . Derrill Memo ON 10/14/2019] epoetin alfa (EPOGEN) injection 2,000 Units  2,000 Units Subcutaneous Q T,Th,Sa-HD Bhutani, Manpreet S, MD      . famotidine (PEPCID) tablet 20 mg  20 mg Oral Daily Charlett Nose, RPH   20 mg at 10/12/19 1103  . glycopyrrolate (ROBINUL) tablet 1 mg  1 mg Oral Q4H PRN Flora Lipps, MD       Or  . glycopyrrolate (ROBINUL) injection 0.2 mg  0.2 mg Subcutaneous Q4H PRN Flora Lipps, MD  Or  . glycopyrrolate (ROBINUL) injection 0.2 mg  0.2 mg Intravenous Q4H PRN Flora Lipps, MD      . heparin injection 1,000 Units  1,000 Units Dialysis PRN Bhutani, Manpreet S, MD      . heparin injection 5,000 Units  5,000 Units Subcutaneous Q8H Tukov-Yual, Arlyss Gandy, NP   Stopped at 10/12/19 2009  . hydrALAZINE (APRESOLINE) tablet 50 mg  50 mg Oral Q8H Kasa, Kurian, MD      .  HYDROmorphone (DILAUDID) injection 0.5 mg  0.5 mg Intravenous Q6H PRN Flora Lipps, MD      . ipratropium-albuterol (DUONEB) 0.5-2.5 (3) MG/3ML nebulizer solution 3 mL  3 mL Nebulization Q6H PRN Tukov-Yual, Magdalene S, NP      . lidocaine (LIDODERM) 5 % 1 patch  1 patch Transdermal Q24H Kasa, Kurian, MD      . lidocaine (PF) (XYLOCAINE) 1 % injection 5 mL  5 mL Intradermal PRN Bhutani, Manpreet S, MD      . lidocaine-prilocaine (EMLA) cream 1 application  1 application Topical PRN Bhutani, Manpreet S, MD      . losartan (COZAAR) tablet 50 mg  50 mg Oral Daily Kasa, Kurian, MD      . oxyCODONE-acetaminophen (PERCOCET/ROXICET) 5-325 MG per tablet 1 tablet  1 tablet Oral Q6H PRN Flora Lipps, MD   1 tablet at 10/13/19 0800  . pentafluoroprop-tetrafluoroeth (GEBAUERS) aerosol 1 application  1 application Topical PRN Bhutani, Manpreet S, MD      . polyvinyl alcohol (LIQUIFILM TEARS) 1.4 % ophthalmic solution 1 drop  1 drop Both Eyes QID PRN Flora Lipps, MD      . pravastatin (PRAVACHOL) tablet 40 mg  40 mg Oral [id number] Flora Lipps, MD      . torsemide (DEMADEX) tablet 40 mg  40 mg Oral Once per day on Sun Mon Wed Fri Kasa, Kurian, MD         Abtx:  Anti-infectives (From admission, onward)   Start     Dose/Rate Route Frequency Ordered Stop   10/11/19 0730  vancomycin (VANCOCIN) IVPB 1000 mg/200 mL premix  Status:  Discontinued     1,000 mg 200 mL/hr over 60 Minutes Intravenous  Once 10/11/19 0715 10/11/19 0927   10/11/19 0730  piperacillin-tazobactam (ZOSYN) IVPB 3.375 g  Status:  Discontinued     3.375 g 100 mL/hr over 30 Minutes Intravenous  Once 10/11/19 0715 10/11/19 0927      REVIEW OF SYSTEMS:  Const: negative fever, negative chills, negative weight loss Eyes: negative diplopia or visual changes, negative eye pain ENT: negative coryza, negative sore throat Resp: negative cough, hemoptysis, has dyspnea Cards: negative for chest pain, palpitations, lower extremity edema GU: negative for  frequency, dysuria and hematuria GI: Negative for abdominal pain, diarrhea, bleeding, constipation Skin: negative for rash and pruritus Heme: negative for easy bruising and gum/nose bleeding MS: negative for myalgias, arthralgias, back pain and muscle weakness Neurolo:negative for headaches, dizziness, vertigo, memory problems  Psych: negative for feelings of anxiety, depression  Endocrine: negative for thyroid, diabetes Allergy/Immunology- negative for any medication or food allergies ? Pertinent Positives include : Objective:  VITALS:  BP (!) 149/44   Pulse 62   Temp (!) 97.5 F (36.4 C) (Bladder)   Resp 19   Ht 4\' 10"  (1.473 m)   Wt 64 kg   SpO2 100%   BMI 29.49 kg/m  PHYSICAL EXAM:  General: Alert, cooperative, no distress, appears stated age.  Head: Normocephalic, without obvious abnormality, atraumatic. Eyes: Conjunctivae  clear, anicteric sclerae. Pupils are equal ENT Nares normal. No drainage or sinus tenderness. Lips, mucosa, and tongue normal. No Thrush Neck: Supple, symmetrical, no adenopathy, thyroid: non tender no carotid bruit and no JVD. Back: No CVA tenderness. Lungs: b/l air entry- crepts bases Heart: systolic murmur Abdomen: Soft, non-tender,not distended. Bowel sounds normal. No masses Extremities: atraumatic, no cyanosis. No edema. No clubbing Skin: No rashes or lesions. Or bruising Lymph: Cervical, supraclavicular normal. Neurologic: Grossly non-focal Pertinent Labs Lab Results CBC    Component Value Date/Time   WBC 8.6 10/12/2019 0411   RBC 2.89 (L) 10/12/2019 0411   HGB 8.6 (L) 10/12/2019 0411   HGB 9.4 (L) 12/10/2014 0404   HCT 26.8 (L) 10/12/2019 0411   HCT 28.5 (L) 12/10/2014 0404   PLT 113 (L) 10/12/2019 0411   PLT 136 (L) 12/10/2014 0404   MCV 92.7 10/12/2019 0411   MCV 89 12/10/2014 0404   MCH 29.8 10/12/2019 0411   MCHC 32.1 10/12/2019 0411   RDW 15.6 (H) 10/12/2019 0411   RDW 14.1 12/10/2014 0404   LYMPHSABS 2.4 10/11/2019 0626    LYMPHSABS 1.4 12/10/2014 0404   MONOABS 0.8 10/11/2019 0626   MONOABS 0.6 12/10/2014 0404   EOSABS 0.3 10/11/2019 0626   EOSABS 0.4 12/10/2014 0404   BASOSABS 0.1 10/11/2019 0626   BASOSABS 0.0 12/10/2014 0404    CMP Latest Ref Rng & Units 10/13/2019 10/12/2019 10/11/2019  Glucose 70 - 99 mg/dL 83 110(H) 222(H)  BUN 8 - 23 mg/dL 34(H) 18 28(H)  Creatinine 0.44 - 1.00 mg/dL 5.04(H) 3.62(H) 5.56(H)  Sodium 135 - 145 mmol/L 136 139 134(L)  Potassium 3.5 - 5.1 mmol/L 4.3 4.1 5.0  Chloride 98 - 111 mmol/L 97(L) 100 96(L)  CO2 22 - 32 mmol/L 26 26 22   Calcium 8.9 - 10.3 mg/dL 8.2(L) 8.7(L) 8.5(L)  Total Protein 6.5 - 8.1 g/dL - - 6.0(L)  Total Bilirubin 0.3 - 1.2 mg/dL - - 0.8  Alkaline Phos 38 - 126 U/L - - 63  AST 15 - 41 U/L - - 37  ALT 0 - 44 U/L - - 17      Microbiology: Recent Results (from the past 240 hour(s))  SARS CORONAVIRUS 2 (TAT 6-24 HRS) Nasopharyngeal Nasopharyngeal Swab     Status: None   Collection Time: 10/04/19  5:13 PM   Specimen: Nasopharyngeal Swab  Result Value Ref Range Status   SARS Coronavirus 2 NEGATIVE NEGATIVE Final    Comment: (NOTE) SARS-CoV-2 target nucleic acids are NOT DETECTED. The SARS-CoV-2 RNA is generally detectable in upper and lower respiratory specimens during the acute phase of infection. Negative results do not preclude SARS-CoV-2 infection, do not rule out co-infections with other pathogens, and should not be used as the sole basis for treatment or other patient management decisions. Negative results must be combined with clinical observations, patient history, and epidemiological information. The expected result is Negative. Fact Sheet for Patients: SugarRoll.be Fact Sheet for Healthcare Providers: https://www.woods-mathews.com/ This test is not yet approved or cleared by the [place] FDA and  has been authorized for detection and/or diagnosis of SARS-CoV-2 by FDA under an  Emergency Use Authorization (EUA). This EUA will remain  in effect (meaning this test can be used) for the duration of the COVID-19 declaration under Section 56 4(b)(1) of the Act, 21 U.S.C. section 360bbb-3(b)(1), unless the authorization is terminated or revoked sooner. Performed at [facility], [place] [address], [place], [place] [address]   SARS CORONAVIRUS 2 (TAT  6-24 HRS) Nasopharyngeal Nasopharyngeal Swab     Status: None   Collection Time: 10/07/19  3:26 PM   Specimen: Nasopharyngeal Swab  Result Value Ref Range Status   SARS Coronavirus 2 NEGATIVE NEGATIVE Final    Comment: (NOTE) SARS-CoV-2 target nucleic acids are NOT DETECTED. The SARS-CoV-2 RNA is generally detectable in upper and lower respiratory specimens during the acute phase of infection. Negative results do not preclude SARS-CoV-2 infection, do not rule out co-infections with other pathogens, and should not be used as the sole basis for treatment or other patient management decisions. Negative results must be combined with clinical observations, patient history, and epidemiological information. The expected result is Negative. Fact Sheet for Patients: SugarRoll.be Fact Sheet for Healthcare Providers: https://www.woods-mathews.com/ This test is not yet approved or cleared by the [place] FDA and  has been authorized for detection and/or diagnosis of SARS-CoV-2 by FDA under an Emergency Use Authorization (EUA). This EUA will remain  in effect (meaning this test can be used) for the duration of the COVID-19 declaration under Section 56 4(b)(1) of the Act, 21 U.S.C. section 360bbb-3(b)(1), unless the authorization is terminated or revoked sooner. Performed at [facility], [place] [address], [place], [place] [address]   SARS Coronavirus 2 by RT PCR (hospital order, performed in [facility] hospital lab) Nasopharyngeal Nasopharyngeal Swab     Status: None    Collection Time: 10/11/19  6:26 AM   Specimen: Nasopharyngeal Swab  Result Value Ref Range Status   SARS Coronavirus 2 NEGATIVE NEGATIVE Final    Comment: (NOTE) If result is NEGATIVE SARS-CoV-2 target nucleic acids are NOT DETECTED. The SARS-CoV-2 RNA is generally detectable in upper and lower  respiratory specimens during the acute phase of infection. The lowest  concentration of SARS-CoV-2 viral copies this assay can detect is 250  copies / mL. A negative result does not preclude SARS-CoV-2 infection  and should not be used as the sole basis for treatment or other  patient management decisions.  A negative result may occur with  improper specimen collection / handling, submission of specimen other  than nasopharyngeal swab, presence of viral mutation(s) within the  areas targeted by this assay, and inadequate number of viral copies  (<250 copies / mL). A negative result must be combined with clinical  observations, patient history, and epidemiological information. If result is POSITIVE SARS-CoV-2 target nucleic acids are DETECTED. The SARS-CoV-2 RNA is generally detectable in upper and lower  respiratory specimens dur ing the acute phase of infection.  Positive  results are indicative of active infection with SARS-CoV-2.  Clinical  correlation with patient history and other diagnostic information is  necessary to determine patient infection status.  Positive results do  not rule out bacterial infection or co-infection with other viruses. If result is PRESUMPTIVE POSTIVE SARS-CoV-2 nucleic acids MAY BE PRESENT.   A presumptive positive result was obtained on the submitted specimen  and confirmed on repeat testing.  While 2019 novel coronavirus  (SARS-CoV-2) nucleic acids may be present in the submitted sample  additional confirmatory testing may be necessary for epidemiological  and / or clinical management purposes  to differentiate between  SARS-CoV-2 and other Sarbecovirus  currently known to infect humans.  If clinically indicated additional testing with an alternate test  methodology [phone]) is advised. The SARS-CoV-2 RNA is generally  detectable in upper and lower respiratory sp ecimens during the acute  phase of infection. The expected result is Negative. Fact Sheet for Patients:  StrictlyIdeas.no Fact Sheet for Healthcare Providers: BankingDealers.co.za This test is not yet approved or cleared by the [place] FDA and has been authorized for detection and/or diagnosis of SARS-CoV-2 by FDA under an Emergency Use Authorization (EUA).  This EUA will remain in effect (meaning this test can be used) for the duration of the COVID-19 declaration under Section 564(b)(1) of the Act, 21 U.S.C. section 360bbb-3(b)(1), unless the authorization is terminated or revoked sooner. Performed at [facility], [address] [place], [place] [address]   Culture, blood (routine x 2)     Status: None (Preliminary result)   Collection Time: 10/11/19  6:52 AM   Specimen: BLOOD  Result Value Ref Range Status   Specimen Description BLOOD BLOOD RIGHT HAND  Final   Special Requests   Final    BOTTLES DRAWN AEROBIC AND ANAEROBIC Blood Culture results may not be optimal due to an excessive volume of blood received in culture bottles   Culture   Final    NO GROWTH 2 DAYS Performed at [facility], [address], [place], [place] [address]    Report Status PENDING  Incomplete  Culture, blood (routine x 2)     Status: Abnormal   Collection Time: 10/11/19  6:52 AM   Specimen: BLOOD  Result Value Ref Range Status   Specimen Description   Final    BLOOD LEFT ANTECUBITAL Performed at [facility], [address] [place], [place] [address]    Special Requests   Final    BOTTLES DRAWN AEROBIC AND ANAEROBIC Blood Culture results may not be optimal due to an excessive volume of blood received in  culture bottles Performed at [facility], [address] [place], [place] [address]    Culture  Setup Time   Final    GRAM POSITIVE COCCI AEROBIC BOTTLE ONLY CRITICAL RESULT CALLED TO, READ BACK BY AND VERIFIED WITH: JODY BAREFOOT AT [id number] 10/12/2019.PMF Performed at [facility], [address] [place], [place] [address]    Culture (A)  Final    STAPHYLOCOCCUS SPECIES (COAGULASE NEGATIVE) THE SIGNIFICANCE OF ISOLATING THIS ORGANISM FROM A SINGLE SET OF BLOOD CULTURES WHEN MULTIPLE SETS ARE DRAWN IS UNCERTAIN. PLEASE NOTIFY THE MICROBIOLOGY DEPARTMENT WITHIN ONE WEEK IF SPECIATION AND SENSITIVITIES ARE REQUIRED. Performed at [facility], [place] [address], [place], [place] [address]    Report Status 10/13/2019 FINAL  Final  Culture, respiratory (tracheal aspirate)     Status: None (Preliminary result)   Collection Time: 10/11/19 10:57 AM   Specimen: Tracheal Aspirate; Respiratory  Result Value Ref Range Status   Specimen Description   Final    TRACHEAL ASPIRATE Performed at [facility], [address] [place], [place] [address]    Special Requests   Final    NONE Performed at [facility], [address] [place], [place] [address]    Gram Stain NO WBC SEEN RARE GRAM POSITIVE COCCI IN PAIRS   Final   Culture   Final    CULTURE REINCUBATED FOR BETTER GROWTH Performed at [facility], [place] [address], [place], [place] [address]    Report Status PENDING  Incomplete    IMAGING RESULTS:  I have personally reviewed the films ? Impression/Recommendation ? ?Acute hypoxic respiratory failure with cardiac arrest likely due to aortic stenosis and flash pulmonary edema. Patient was resuscitated and intubated and now has been extubated.  SARS-CoV-2 antigen positive but PCR negative.  This is not acute  SARS Covid infection this is very likely a false positive antigen test. Patient has had repeated SARS Covid test in the past is all  been negative.  Unlikely SARS-CoV-2 will present acutely with chest pain shortness of breath and cardiac arrest like the patient had before she came to the hospital.. Acute hypoxic resp failure/cardiac arrest- aortic stenosis contributing to it-causing flash pulmonary edema DC isolation  ESRD on dialysis ? _Discussed management with Dr.KAsa- ID will sign off- call if needed __________________________________________________ Discussed with patient, . Note:  This document was prepared using Dragon voice recognition software and may include unintentional dictation errors.

## 2019-10-13 NOTE — Progress Notes (Signed)
This note also relates to the following rows which could not be included: Pulse Rate - Cannot attach notes to unvalidated device data Resp - Cannot attach notes to unvalidated device data BP - Cannot attach notes to unvalidated device data  Hd started  

## 2019-10-13 NOTE — TOC Initial Note (Signed)
Transition of Care [facility]) - Initial/Assessment Note    Patient Details  Name: Phyllis [name] MRN: [id number] Date of Birth: 20-May-1946  Transition of Care [facility]) CM/SW Contact:    Candie Chroman, LCSW Phone Number: 10/13/2019, 1:23 PM  Clinical Narrative: CSW met with patient. No supports at bedside. CSW introduced role and explained that discharge planning would be discussed. Patient confirmed she was getting rehab at [facility] prior to admission and plan is to return at discharge. SNF wants one more negative COVID test due to confusion with positive results. MD is aware. Once results are in, she will return to [facility] after HD tomorrow. No further concerns. CSW encouraged patient to contact CSW as needed. CSW will continue to follow patient for support and facilitate return to SNF once medically stable.               Expected Discharge Plan: Skilled Nursing Facility Barriers to Discharge: Continued Medical Work up   Patient Goals and CMS Choice     Choice offered to / list presented to : NA  Expected Discharge Plan and Services Expected Discharge Plan: [place] Choice: [place] Living arrangements for the past 2 months: [place]                                      Prior Living Arrangements/Services Living arrangements for the past 2 months: [place] Lives with:: Friends Patient language and need for interpreter reviewed:: Yes Do you feel safe going back to the place where you live?: Yes      Need for Family Participation in Patient Care: Yes (Comment) Care giver support system in place?: Yes (comment)   Criminal Activity/Legal Involvement Pertinent to Current Situation/Hospitalization: No - Comment as needed  Activities of Daily Living Home Assistive Devices/Equipment: None ADL Screening (condition at time of admission) Patient's cognitive ability adequate  to safely complete daily activities?: No Is the patient deaf or have difficulty hearing?: No Does the patient have difficulty seeing, even when wearing glasses/contacts?: No Does the patient have difficulty concentrating, remembering, or making decisions?: No Patient able to express need for assistance with ADLs?: No Does the patient have difficulty dressing or bathing?: Yes Independently performs ADLs?: No Does the patient have difficulty walking or climbing stairs?: Yes Weakness of Legs: Both Weakness of Arms/Hands: Both  Permission Sought/Granted Permission sought to share information with : Facility Sport and exercise psychologist, Family Supports Permission granted to share information with : Yes, Verbal Permission Granted  Share Information with NAME: Orbie Pyo  Permission granted to share info w AGENCY: [facility] SNF  Permission granted to share info w Relationship: Friend  Permission granted to share info w Contact Information: [phone]  Emotional Assessment Appearance:: Appears stated age Attitude/Demeanor/Rapport: Engaged, Gracious Affect (typically observed): Accepting, Appropriate, Calm, Pleasant Orientation: : Oriented to Self, Oriented to Place, Oriented to  Time, Oriented to Situation Alcohol / Substance Use: Never Used Psych Involvement: No (comment)  Admission diagnosis:  Acute pulmonary edema (HCC) [J81.0] Acute respiratory failure with hypoxia (HCC) [J96.01] Sepsis, due to unspecified organism, unspecified whether acute organ dysfunction present [facility]) [A41.9] Patient Active Problem List   Diagnosis Date Noted  . Respiratory failure with hypoxia ([place]) 10/11/2019  . Acute on chronic anemia   . Acute on chronic respiratory failure with hypoxia (HCC)  10/01/2019  . Aortic valve stenosis   . Acute respiratory failure with hypoxemia ([place]) 09/20/2019  . COPD with acute bronchitis ([place]) 09/05/2019  . Congestive heart failure ([place]) 05/24/2019  . Hyperkalemia  11/29/2018  . Chronic diastolic heart failure ([place]) 11/02/2018  . HTN (hypertension) 11/02/2018  . Uncontrolled hypertension 10/21/2018  . Pressure injury of skin 03/16/2018  . CVA (cerebral vascular accident) ([place]) 01/16/2018  . Aortic atherosclerosis ([place]) 12/11/2017  . Chest pain 09/18/2017  . Hyperlipidemia 08/06/2015  . Complication from renal dialysis device 04/12/2015  . SOB (shortness of breath) 02/01/2015  . End stage renal disease on dialysis ([place]) 02/01/2015  . Type 2 diabetes mellitus with other specified complication ([place]) 38/33/3832  . Asthma 02/01/2015  . Acute on chronic diastolic CHF (congestive heart failure) ([place]) 02/01/2015   PCP:  [name], MD Pharmacy:   [place], [place] [address] [place] [address] Phone: [phone] Fax: [place], [place] [address] [place] [address] Phone: [phone] Fax: [phone]  [id number] [place] [id number] [id number]: [phone]: [phone]    Social Determinants of Health (SDOH) Interventions    Readmission Risk Interventions Readmission Risk Prevention Plan 09/26/2019 05/07/2019  Transportation Screening Complete Complete  Medication Review (Press photographer Complete Complete  PCP or Specialist appointment within 3-5 days of discharge Complete Complete  HRI or Home Care Consult Complete Complete  SW Recovery Care/Counseling Consult Complete -  Palliative Care Screening Not Applicable Not [place] Applicable Not Applicable  Some recent data might be hidden

## 2019-10-13 NOTE — Progress Notes (Signed)
Established hemodialysis patient known at [facility] ([address] TTS 11:45. Please contact me with any dialysis placement concerns.   Josanna Hefel Dialysis Coordinator [phone] 

## 2019-10-13 NOTE — Discharge Summary (Signed)
Physician Discharge Summary  Patient ID: Phyllis [name] MRN: [id number] DOB/AGE: 02/26/1946 73 y.o.  Admit date: 10/11/2019 Discharge date: 10/13/2019  Admission Diagnoses: cardiac arrest and resp failure   Discharge Diagnoses:  Active Problems:   Respiratory failure with hypoxia (HCC) CARDIOGENIC PULMONARY EDEMA SEVERE AS ESRD ON HD MULTIPLE ADMISSIONS FOR FLASH PULM EDEMA  Discharged [place] Hospital Course:  73 yo WF admitted for acute cardiac arrest and resp failure from acute pulmonary edema from End stage Renal disease  Acute cardiac arrest-resolved Resp failure-resolved ESRD on HD follow up Nephrology recs Oxygen as needed Diet as tolerated  PATIENT HAS AGREED FOR AGGRESSIVE CARDIAC CARE AND WILL BE TRANSFERRED TO [place] FOR EVALUATION  Consults: Nephrology    Treatments: Vent support Oxygen supoort   Discharge Exam: Blood pressure (!) 149/44, pulse 77, temperature (!) 97.5 F (36.4 C), temperature source Bladder, resp. rate (!) 22, height 4\' 10"  (1.473 m), weight 64 kg, SpO2 99 %.   Disposition: SNF NEEDS OUTPATIENT FOLLOW UP CARDIOLOGY UP FOR TAVR  Allergies as of 10/13/2019      Reactions   Enalapril Maleate Other (See Comments)   Other reaction(s): Headache   Nitrofurantoin Swelling, Rash   Other Reaction: swelling of body   Sulfamethoxazole-trimethoprim Swelling   2,4-d Dimethylamine (amisol) Rash, Other (See Comments)   Other Reaction: h/a   Baclofen Other (See Comments), Nausea Only   lightheadness ,drowsiness , muscle weakness , twitching in hands    Neosporin [neomycin-bacitracin Zn-polymyx] Other (See Comments), Rash   Other Reaction: irritation Skin irritation   Quinine Nausea And Vomiting, Rash, Other (See Comments)   Other Reaction: Vomiting, rash, h/a, vision   Ultram [tramadol] Palpitations   Zocor [simvastatin] Other (See Comments), Rash   Other Reaction: muscle spasms Muscle pain and spasms   Bactrim  [sulfamethoxazole-trimethoprim] Swelling   Levodopa Other (See Comments)   Reaction: unknown   Macrodantin [nitrofurantoin Macrocrystal] Swelling   Quinine Derivatives Other (See Comments)   Vertigo,nausea vomiting blurred vision headache ears sensitivity      Medication List    TAKE these medications   acetaminophen 500 MG tablet Commonly known as: TYLENOL Take 1 tablet (500 mg total) by mouth every 6 (six) hours as needed for mild pain or fever.   albuterol 108 (90 Base) MCG/ACT inhaler Commonly known as: VENTOLIN HFA Inhale 2 puffs into the lungs every 6 (six) hours as needed for wheezing or shortness of breath.   amLODipine 10 MG tablet Commonly known as: NORVASC Take 10 mg by mouth daily.   calcium acetate 667 MG capsule Commonly known as: PHOSLO Take 1,334 mg by mouth 3 (three) times daily with meals.   carvedilol 12.5 MG tablet Commonly known as: COREG Take 12.5 mg by mouth 2 (two) times daily with a meal.   cetirizine 10 MG tablet Commonly known as: ZYRTEC Take 10 mg by mouth daily as needed for allergies.   clopidogrel 75 MG tablet Commonly known as: PLAVIX Take 1 tablet (75 mg total) by mouth daily.   Fluticasone-Salmeterol 250-50 MCG/DOSE Aepb Commonly known as: ADVAIR Inhale 1 puff into the lungs 2 (two) times daily as needed (for shortness of breath).   hydrALAZINE 50 MG tablet Commonly known as: APRESOLINE Take 1 tablet (50 mg total) by mouth every 8 (eight) hours.   HYDROcodone-acetaminophen 7.5-325 MG tablet Commonly known as: NORCO Take 1 tablet by mouth every 6 (six) hours as needed (pain).   lidocaine 5 % Commonly known as: LIDODERM Place 1 patch  onto the skin daily. Remove & Discard patch within 12 hours or as directed by MD Start taking on: October 14, 2019   lidocaine-prilocaine cream Commonly known as: EMLA Apply 1 application topically as needed (prior to treatment).   losartan 50 MG tablet Commonly known as: COZAAR Take 50 mg by  mouth daily.   omeprazole 20 MG capsule Commonly known as: PRILOSEC Take 1 capsule (20 mg total) by mouth 2 (two) times daily before a meal.   ondansetron 4 MG tablet Commonly known as: ZOFRAN Take 4 mg by mouth every 8 (eight) hours as needed.   oxybutynin 15 MG 24 hr tablet Commonly known as: DITROPAN XL Take 15 mg by mouth daily.   pravastatin 40 MG tablet Commonly known as: PRAVACHOL Take 40 mg by mouth at bedtime.   scopolamine 1 MG/3DAYS Commonly known as: TRANSDERM-SCOP Place 1 patch onto the skin every 3 (three) days.   topiramate 25 MG tablet Commonly known as: TOPAMAX Take 1 tablet (25 mg total) by mouth as needed (for headaches).   torsemide 20 MG tablet Commonly known as: DEMADEX Take 2 tablets (40 mg total) by mouth every Monday,Wednesday,Friday, and Sunday at 6 PM.        Signed: Aniston Christman 10/13/2019, 1:35 PM

## 2019-10-13 NOTE — Progress Notes (Signed)
This note also relates to the following rows which could not be included: Pulse Rate - Cannot attach notes to unvalidated device data Resp - Cannot attach notes to unvalidated device data BP - Cannot attach notes to unvalidated device data  Hd completed  

## 2019-10-13 NOTE — Progress Notes (Signed)
   Presented to [facility] with respiratory failure, complicated by PEA arrest and underwent same day dialysis 11/23. As indicated in 11/23 consult note, it is suspected that her recurrent hemodynamic instability and respiratory failure is due to her underlying severe aortic stenosis in the setting of significant fluid shifts with dialysis. Underlying cardiac ischemia cannot be excluded.   [facility] contacted and aware of need for transfer to [facility] for TAVR evaluation 2/2 severe aortic stenosis with recommendation for [facility] s/p transfer to [facility] for further evaluation and to rule out underlying cardiac ischemia due to CAD.   [facility] contacted and aware of transfer. Discharge summary completed for [facility]. Pending creation of [facility] account, H&P and transfer orders to be placed.  Signed, Arvil Chaco, PA-C 10/13/2019, 9:15 PM Pager [phone]

## 2019-10-13 NOTE — Consult Note (Signed)
Cardiology Consultation:   Patient ID: Phyllis [name] MRN: [id number]; DOB: Jul 28, 1946  Admit date: 10/11/2019 Date of Consult: 10/13/2019  Primary Care Provider: [name], MD Primary Cardiologist: Ida Rogue, MD  Physician requesting consult: Dr. Mortimer Fries Reason for consult: Flash pulmonary edema   Patient Profile:   Phyllis [name] is a 73 y.o. female with a hx of  aortic valve stenosis, chronic renal failure, on hemodialysis 3 days per week  diffuse aortic atherosclerosis seen on CT scan 2014, obesity, hypertension, hyperlipidemia  Presenting to the hospital with respiratory failure complicated by PEA arrest.    History of Present Illness:   Phyllis [name] is a 73 year old female with extensive medical problems listed above.  Over the last few months, she had multiple presentations for respiratory failure due to recurrent flash pulmonary edema.  She is known to have moderate to severe aortic stenosis which is suspected to be contributing.  She resides at the [facility] and was brought due to respiratory failure complicated by PEA arrest which required brief CPR on November 21.  COVID-19 antigen was positive but the swab was negative and she is thought to be negative for Covid.  Initially, the plan was to make her DNR and comfort care given poor prognosis and recurrent presentation.  However, the patient was extubated and was alert and oriented with good neurologic status.  The patient was supposed to be evaluated by the TAVR team as an outpatient.  She complains of fatigue and shortness of breath.  She is also known to have chronic chest pain thought to be due to costochondritis.   Heart Pathway Score:     Past Medical History:  Diagnosis Date   (HFpEF) heart failure with preserved ejection fraction ([place])    a. TTE 01/2014: nl LV sys fxn, no valvular abnormalities; b. TTE 11/16: nl EF, mild LVH;  c. 04/2019 Echo: EF 60-65%. DD. Nl RV fxn. Mod AS. Mild-mod LAE, Sev  mitral annular Ca2+ w/o stenosis.   Allergy    Anemia of chronic disease    Anxiety    Aortic atherosclerosis (H[facility])    Asthma    Chronic back pain    COPD (chronic obstructive pulmonary disease) ([place])    Diabetes mellitus with complication ([place])    ESRD on hemodialysis ([place])    a. Tues/Sat; b. 2/2 small kidneys   Essential hypertension    Fistula    lower left arm   GERD (gastroesophageal reflux disease)    Gout    History of exercise stress test    a. 01/2014: no evidence of ischemia; b. Lexiscan 08/2015: no sig ischemia, severe GI uptake artifact, low risk; c. CPET @ [facility] 09/2016: exercised 3 min 12 sec on bike without incline, 2.28 METs, VO2 of 8.1, 48% of predicted, indicating mod to sev functional impairment, evidence of blunted HR, stroke volume, and BP augmentation as well as ventilation-perfusion mismatch with exercise   HLD (hyperlipidemia)    Moderate aortic stenosis    a. 04/2019 Echo: Mod AS.   Non-obstructive Carotid arterial disease ([place])    a. 12/2017: <50% bilat ICA dzs.   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    right chest   Sleep apnea     Past Surgical History:  Procedure Laterality Date   carpel tunnel     GALLBLADDER SURGERY     PERIPHERAL VASCULAR CATHETERIZATION N/A 04/12/2015   Procedure: A/V Shuntogram/Fistulagram;  Surgeon: Algernon Huxley, MD;  Location: [facility];  Service:  Cardiovascular;  Laterality: N/A;   PERIPHERAL VASCULAR CATHETERIZATION N/A 04/12/2015   Procedure: A/V Shunt Intervention;  Surgeon: Algernon Huxley, MD;  Location: [facility];  Service: Cardiovascular;  Laterality: N/A;   PERIPHERAL VASCULAR CATHETERIZATION N/A 06/09/2015   Procedure: Dialysis/Perma Catheter Removal;  Surgeon: Katha Cabal, MD;  Location: [facility];  Service: Cardiovascular;  Laterality: N/A;     Home Medications:  Prior to Admission medications   Medication Sig Start Date End Date Taking? Authorizing  Provider  albuterol (PROVENTIL HFA;VENTOLIN HFA) 108 (90 Base) MCG/ACT inhaler Inhale 2 puffs into the lungs every 6 (six) hours as needed for wheezing or shortness of breath.   Yes [provider]  albuterol (PROVENTIL) (2.5 MG/3ML) 0.083% nebulizer solution Take 3 mLs (2.5 mg total) by nebulization every 6 (six) hours as needed for wheezing or shortness of breath. 05/04/19  Yes Nance Pear, MD  amLODipine (NORVASC) 10 MG tablet Take 10 mg by mouth daily.  12/29/13  Yes [provider]  calcium acetate (PHOSLO) 667 MG capsule Take 1,334 mg by mouth 3 (three) times daily with meals.    Yes [provider]  carvedilol (COREG) 12.5 MG tablet Take 12.5 mg by mouth 2 (two) times daily with a meal.  01/09/14  Yes [provider]  cetirizine (ZYRTEC) 10 MG tablet Take 10 mg by mouth daily as needed for allergies.    Yes [provider]  clopidogrel (PLAVIX) 75 MG tablet Take 1 tablet (75 mg total) by mouth daily. 01/18/18  Yes Epifanio Lesches, MD  Fluticasone-Salmeterol (ADVAIR) 250-50 MCG/DOSE AEPB Inhale 1 puff into the lungs 2 (two) times daily as needed (for shortness of breath). 09/07/19  Yes Henreitta Leber, MD  furosemide (LASIX) 80 MG tablet Take 1 tablet (80 mg total) by mouth daily. 03/17/18  Yes Wieting, Richard, MD  hydrALAZINE (APRESOLINE) 100 MG tablet Take 1 tablet (100 mg total) by mouth 3 (three) times daily. 06/02/19  Yes Gollan, Kathlene November, MD  HYDROcodone-acetaminophen (NORCO) 7.5-325 MG tablet Take 1-2 tablets by mouth daily as needed (pain).  04/09/19  Yes [provider]  lidocaine-prilocaine (EMLA) cream Apply 1 application topically as needed (prior to treatment).    Yes [provider]  losartan (COZAAR) 50 MG tablet Take 50 mg by mouth daily. 03/18/19  Yes [provider]  omeprazole (PRILOSEC) 20 MG capsule Take 1 capsule (20 mg total) by mouth 2 (two) times daily before a meal. 09/07/19  Yes Sainani, Belia Heman,  MD  ondansetron (ZOFRAN) 4 MG tablet Take 4 mg by mouth every 8 (eight) hours as needed. 05/20/19  Yes [provider]  oxybutynin (DITROPAN XL) 15 MG 24 hr tablet Take 15 mg by mouth daily. 12/24/17  Yes [provider]  pravastatin (PRAVACHOL) 40 MG tablet Take 40 mg by mouth at bedtime.    Yes [provider]  topiramate (TOPAMAX) 25 MG tablet Take 1 tablet (25 mg total) by mouth as needed (for headaches). 09/07/19  Yes Henreitta Leber, MD    Inpatient Medications: Scheduled Meds:  amLODipine  10 mg Oral Daily   calcium acetate  1,334 mg Oral TID WC   carvedilol  12.5 mg Oral BID WC   Chlorhexidine Gluconate Cloth  6 each Topical Q0600   clopidogrel  75 mg Oral Daily   [START ON 10/14/2019] epoetin (EPOGEN/PROCRIT) injection  2,000 Units Subcutaneous Q T,Th,Sa-HD   famotidine  20 mg Oral Daily   heparin  5,000 Units Subcutaneous Q8H   hydrALAZINE  50 mg Oral Q8H   lidocaine  1 patch Transdermal Q24H   losartan  50 mg Oral Daily   pravastatin  40 mg Oral q1800   torsemide  40 mg Oral Once per day on Sun Mon Wed Fri   Continuous Infusions:  sodium chloride 10 mL/hr at 10/11/19 1300   sodium chloride     sodium chloride     dextrose 20 mL/hr at 10/13/19 1400   PRN Meds: sodium chloride, sodium chloride, sodium chloride, acetaminophen **OR** acetaminophen, acetaminophen, alteplase, benzonatate, diphenhydrAMINE, glycopyrrolate **OR** glycopyrrolate **OR** glycopyrrolate, heparin, HYDROmorphone (DILAUDID) injection, ipratropium-albuterol, lidocaine (PF), lidocaine-prilocaine, ondansetron (ZOFRAN) IV, oxyCODONE-acetaminophen, pentafluoroprop-tetrafluoroeth, polyvinyl alcohol  Allergies:    Allergies  Allergen Reactions   Enalapril Maleate Other (See Comments)    Other reaction(s): Headache   Nitrofurantoin Swelling and Rash    Other Reaction: swelling of body   Sulfamethoxazole-Trimethoprim Swelling   2,4-D Dimethylamine (Amisol) Rash  and Other (See Comments)    Other Reaction: h/a   Baclofen Other (See Comments) and Nausea Only    lightheadness ,drowsiness , muscle weakness , twitching in hands    Neosporin [Neomycin-Bacitracin Zn-Polymyx] Other (See Comments) and Rash    Other Reaction: irritation Skin irritation    Quinine Nausea And Vomiting, Rash and Other (See Comments)    Other Reaction: Vomiting, rash, h/a, vision   Ultram [Tramadol] Palpitations   Zocor [Simvastatin] Other (See Comments) and Rash    Other Reaction: muscle spasms Muscle pain and spasms   Bactrim [Sulfamethoxazole-Trimethoprim] Swelling   Levodopa Other (See Comments)    Reaction: unknown   Macrodantin [Nitrofurantoin Macrocrystal] Swelling   Quinine Derivatives Other (See Comments)    Vertigo,nausea vomiting blurred vision headache ears sensitivity     Social History:   Social History   Socioeconomic History   Marital status: Single    Spouse name: Not on file   Number of children: Not on file   Years of education: Not on file   Highest education level: Not on file  Occupational History   Not on file  Social Needs   Financial resource strain: Not on file   Food insecurity    Worry: Not on file    Inability: Not on file   Transportation needs    Medical: Not on file    Non-medical: Not on file  Tobacco Use   Smoking status: Never Smoker   Smokeless tobacco: Never Used  Substance and Sexual Activity   Alcohol use: No   Drug use: No   Sexual activity: Not Currently  Lifestyle   Physical activity    Days per week: Not on file    Minutes per session: Not on file   Stress: Not on file  Relationships   Social connections    Talks on phone: Not on file    Gets together: Not on file    Attends religious service: Not on file    Active member of club or organization: Not on file    Attends meetings of clubs or organizations: Not on file    Relationship status: Not on file   Intimate partner  violence    Fear of current or ex partner: Not on file    Emotionally abused: Not on file    Physically abused: Not on file    Forced sexual activity: Not on file  Other Topics Concern   Not on file  Social History Narrative   Not on file  Family History:    Family History  Problem Relation Age of Onset   Hypertension Mother    Hyperlipidemia Mother    Heart disease Father    Heart attack Father 56   Hypertension Father    Hyperlipidemia Father    Heart disease Brother        CABG    Heart attack Brother    Breast cancer Neg Hx      ROS:  Please see the history of present illness.  Review of Systems  Constitutional: Negative.   HENT: Negative.   Respiratory: Positive for shortness of breath.   Cardiovascular: Negative.   Gastrointestinal: Negative.   Musculoskeletal: Negative.   Neurological: Negative.   Psychiatric/Behavioral: Negative.   All other systems reviewed and are negative.  Positive sharp chest pain.  Physical Exam/Data:   Vitals:   10/13/19 1445 10/13/19 1500 10/13/19 1515 10/13/19 1530  BP: (!) 163/47 (!) 153/46  123/61  Pulse: 71 74 69 76  Resp: 17 18 17  (!) 21  Temp:      TempSrc:      SpO2:      Weight:      Height:        Intake/Output Summary (Last 24 hours) at 10/13/2019 1544 Last data filed at 10/13/2019 1400 Gross per 24 hour  Intake 1076.51 ml  Output 420 ml  Net 656.51 ml   Last 3 Weights 10/13/2019 10/13/2019 10/12/2019  Weight (lbs) 141 lb 1.5 oz 141 lb 1.5 oz 130 lb 4.7 oz  Weight (kg) 64 kg 64 kg 59.1 kg     Body mass index is 29.49 kg/m.  General:  Well nourished, mild respiratory distress, on BiPAP, appears pale HEENT: normal Lymph: no adenopathy Neck: no JVD Endocrine:  No thryomegaly Vascular: No carotid bruits; FA pulses 2+ bilaterally without bruits  Cardiac:  normal S1, S2; RRR; 3 out of 6 crescendo decrescendo systolic murmur which is late peaking Lungs: Scattered Rales, coarse breath  sounds Abd: soft, nontender, no hepatomegaly  Ext: no edema Musculoskeletal:  No deformities, BUE and BLE strength normal and equal Skin: warm and dry  Neuro:  CNs 2-12 intact, no focal abnormalities noted Psych:  Normal affect   EKG:  The EKG was personally reviewed and demonstrates:   Sinus tachycardia with an IVCD and nonspecific T wave changes.  Telemetry:  Telemetry was personally reviewed and demonstrates:   Normal sinus rhythm  Relevant CV Studies: Echocardiogram performed on November 1 1. Left ventricular ejection fraction, by visual estimation, is 60 to 65%. The left ventricle has normal function. There is mildly increased left ventricular hypertrophy.  2. Global right ventricle has normal systolic function.The right ventricular size is normal. No increase in right ventricular wall thickness.  3. Left atrial size was mild-moderately dilated.  4. Mild mitral valve regurgitation.  5. The tricuspid valve is normal in structure. Tricuspid valve regurgitation is not demonstrated.  6. The aortic valve was not well visualized. Measurements as detailed below, Moderate aortic valve stenosis by gradients, severe stenosis by estimated AVA.  7. TR signal is inadequate for assessing pulmonary artery systolic pressure.  8. Aortic valve mean gradient measures 20.6 mmHg.  9. Aortic valve peak gradient measures 32.0 mmHg. 10. Aortic valve area, by VTI measures 0.68 cm.   Laboratory Data:  High Sensitivity Troponin:   Recent Labs  Lab 09/22/19 1238 09/22/19 1853 10/01/19 1650 10/01/19 1836 10/11/19 0626  TROPONINIHS 204* 185* 23* 26* 64*     Chemistry Recent Labs  Lab 10/11/19 0626 10/12/19 0411 10/13/19 0640  NA 134* 139 136  K 5.0 4.1 4.3  CL 96* 100 97*  CO2 22 26 26   GLUCOSE 222* 110* 83  BUN 28* 18 34*  CREATININE 5.56* 3.62* 5.04*  CALCIUM 8.5* 8.7* 8.2*  GFRNONAA 7* 12* 8*  GFRAA 8* 14* 9*  ANIONGAP 16* 13 13    Recent Labs  Lab 10/11/19 0626 10/13/19 0640   PROT 6.0*  --   ALBUMIN 3.6 3.1*  AST 37  --   ALT 17  --   ALKPHOS 63  --   BILITOT 0.8  --    Hematology Recent Labs  Lab 10/11/19 0626 10/12/19 0411  WBC 12.4* 8.6  RBC 3.46* 2.89*  HGB 10.5* 8.6*  HCT 32.8* 26.8*  MCV 94.8 92.7  MCH 30.3 29.8  MCHC 32.0 32.1  RDW 15.9* 15.6*  PLT 272 113*   BNP Recent Labs  Lab 10/11/19 0626  BNP 2,087.0*    DDimer No results for input(s): DDIMER in the last 168 hours.   Radiology/Studies:  Dg Chest Port 1 View  Result Date: 10/12/2019 CLINICAL DATA:  Respiratory failure EXAM: PORTABLE CHEST 1 VIEW COMPARISON:  10/11/2019, 10/01/2019, 09/24/2019 FINDINGS: Removal of endotracheal tube. Low lung volumes. Patchy focus of atelectasis at the left base. Borderline to mild cardiomegaly. Aortic atherosclerosis. No pneumothorax. Removal of esophageal tube. IMPRESSION: 1. Removal of endotracheal and esophageal tubes. 2. Low lung volumes with atelectasis at the left base. Mild cardiomegaly. Electronically Signed   By: Donavan Foil M.D.   On: 10/12/2019 01:00   Dg Chest Portable 1 View  Result Date: 10/11/2019 CLINICAL DATA:  Status post CPR. EXAM: PORTABLE CHEST 1 VIEW COMPARISON:  10/01/2019 FINDINGS: 0647 hours. Endotracheal tube tip is 2.4 cm above the base of the carina. Cardiopericardial silhouette is at upper limits of normal for size. There is pulmonary vascular congestion with interstitial prominence suggesting edema. NG tube tip is positioned in the mid stomach. The visualized bony structures of the thorax are intact. Insert wire. IMPRESSION: 1. Endotracheal tube tip is 2.4 cm above the base of the carina. 2. Pulmonary vascular congestion with interstitial edema. Electronically Signed   By: Misty Stanley M.D.   On: 10/11/2019 07:36    Assessment and Plan:   1. Recurrent flash pulmonary edema Likely multifactorial in the setting of end-stage renal disease/hemodialysis dependent, worsening anemia with underlying severe aortic  stenosis. The patient is currently getting fluid removal via dialysis. It is highly likely that her recurrent hemodynamic instability and respiratory failure is due to underlying severe aortic stenosis in the setting of significant fluid shifts with dialysis. -In addition, underlying cardiac ischemia cannot be completely excluded.  2.  Aortic valve stenosis Measurements appear moderate by gradient though severe by estimated aortic valve area -Clinically, in my physical exam, she appears to have severe aortic stenosis. Recommend transfer to [facility] to be evaluated by our TAVR team.  She will need a right and left cardiac catheterization.  3.  Elevated troponin Troponin was 64 2 days ago.  Suspect supply demand ischemia but need to rule out underlying coronary artery disease.  4.  Anemia Underlying anemia is likely contributing to hemodynamic instability.  Most recent hemoglobin was 8.6 yesterday.  5.  End-stage renal disease on hemodialysis 3 days/week     For questions or updates, please contact [facility] Please consult [email or web address] for contact info under     Signed, Kathlyn Sacramento, MD  10/13/2019 3:44 PM

## 2019-10-13 NOTE — Progress Notes (Signed)
[facility]  ROUNDING NOTE   Subjective:  Patient resting comfortably in bed at the moment. Does have an ice pack on her chest which she uses frequently for chest pain.    Objective:  Vital signs in last 24 hours:  Temp:  [97.5 F (36.4 C)-98.7 F (37.1 C)] 97.5 F (36.4 C) (11/23 0840) Pulse Rate:  [57-86] 77 (11/23 1313) Resp:  [15-36] 22 (11/23 1313) BP: (119-177)/(39-70) 149/44 (11/23 1100) SpO2:  [99 %-100 %] 99 % (11/23 1313) Weight:  [64 kg] 64 kg (11/23 0443)  Weight change: 6.8 kg Filed Weights   10/11/19 0905 10/12/19 0500 10/13/19 0443  Weight: 57.2 kg 59.1 kg 64 kg    Intake/Output: I/O last 3 completed shifts: In: 1170.3 [P.O.:480; I.V.:690.3] Out: 1385 [Urine:385; Other:1000]   Intake/Output this shift:  Total I/O In: 380.5 [P.O.:240; I.V.:140.5] Out: 120 [Urine:120]   Physical Exam: General: NAD, laying in the bed  Head: [place]/AT, moist oral mucous membranes  Eyes: Anicteric  Lungs:  clear to auscultation  Heart: Regular rate and rhythm, +murmur  Abdomen:  Soft, nontender, obese  Extremities: No peripheral edema.  Neurologic: Nonfocal, alert and oriented  Skin: No lesions  Access: Left forearm AVF, good bruit    Basic Metabolic Panel: Recent Labs  Lab 10/11/19 0626 10/12/19 0411 10/13/19 0640  NA 134* 139 136  K 5.0 4.1 4.3  CL 96* 100 97*  CO2 22 26 26   GLUCOSE 222* 110* 83  BUN 28* 18 34*  CREATININE 5.56* 3.62* 5.04*  CALCIUM 8.5* 8.7* 8.2*  MG  --  1.7 1.9  PHOS  --  3.4 4.1    Liver Function Tests: Recent Labs  Lab 10/11/19 0626 10/13/19 0640  AST 37  --   ALT 17  --   ALKPHOS 63  --   BILITOT 0.8  --   PROT 6.0*  --   ALBUMIN 3.6 3.1*   No results for input(s): LIPASE, AMYLASE in the last 168 hours. No results for input(s): AMMONIA in the last 168 hours.  CBC: Recent Labs  Lab 10/11/19 0626 10/12/19 0411  WBC 12.4* 8.6  NEUTROABS 8.4*  --   HGB 10.5* 8.6*  HCT 32.8* 26.8*  MCV 94.8 92.7  PLT  272 113*    Cardiac Enzymes: No results for input(s): CKTOTAL, CKMB, CKMBINDEX, TROPONINI in the last 168 hours.  BNP: Invalid input(s): POCBNP  CBG: Recent Labs  Lab 10/08/19 1153 10/11/19 0901 10/11/19 1648 10/13/19 0741 10/13/19 1134  GLUCAP 92 137* 148* 75 110*    Microbiology: Results for orders placed or performed during the hospital encounter of 10/11/19  SARS Coronavirus 2 by RT PCR (hospital order, performed in [facility] hospital lab) Nasopharyngeal Nasopharyngeal Swab     Status: None   Collection Time: 10/11/19  6:26 AM   Specimen: Nasopharyngeal Swab  Result Value Ref Range Status   SARS Coronavirus 2 NEGATIVE NEGATIVE Final    Comment: (NOTE) If result is NEGATIVE SARS-CoV-2 target nucleic acids are NOT DETECTED. The SARS-CoV-2 RNA is generally detectable in upper and lower  respiratory specimens during the acute phase of infection. The lowest  concentration of SARS-CoV-2 viral copies this assay can detect is 250  copies / mL. A negative result does not preclude SARS-CoV-2 infection  and should not be used as the sole basis for treatment or other  patient management decisions.  A negative result may occur with  improper specimen collection / handling, submission of specimen other  than  nasopharyngeal swab, presence of viral mutation(s) within the  areas targeted by this assay, and inadequate number of viral copies  (<250 copies / mL). A negative result must be combined with clinical  observations, patient history, and epidemiological information. If result is POSITIVE SARS-CoV-2 target nucleic acids are DETECTED. The SARS-CoV-2 RNA is generally detectable in upper and lower  respiratory specimens dur ing the acute phase of infection.  Positive  results are indicative of active infection with SARS-CoV-2.  Clinical  correlation with patient history and other diagnostic information is  necessary to determine patient infection status.  Positive results do   not rule out bacterial infection or co-infection with other viruses. If result is PRESUMPTIVE POSTIVE SARS-CoV-2 nucleic acids MAY BE PRESENT.   A presumptive positive result was obtained on the submitted specimen  and confirmed on repeat testing.  While 2019 novel coronavirus  (SARS-CoV-2) nucleic acids may be present in the submitted sample  additional confirmatory testing may be necessary for epidemiological  and / or clinical management purposes  to differentiate between  SARS-CoV-2 and other Sarbecovirus currently known to infect humans.  If clinically indicated additional testing with an alternate test  methodology [phone]) is advised. The SARS-CoV-2 RNA is generally  detectable in upper and lower respiratory sp ecimens during the acute  phase of infection. The expected result is Negative. Fact Sheet for Patients:  StrictlyIdeas.no Fact Sheet for Healthcare Providers: BankingDealers.co.za This test is not yet approved or cleared by the [place] FDA and has been authorized for detection and/or diagnosis of SARS-CoV-2 by FDA under an Emergency Use Authorization (EUA).  This EUA will remain in effect (meaning this test can be used) for the duration of the COVID-19 declaration under Section 564(b)(1) of the Act, 21 U.S.C. section 360bbb-3(b)(1), unless the authorization is terminated or revoked sooner. Performed at [facility], [address] [place], [place] [address]   Culture, blood (routine x 2)     Status: None (Preliminary result)   Collection Time: 10/11/19  6:52 AM   Specimen: BLOOD  Result Value Ref Range Status   Specimen Description BLOOD BLOOD RIGHT HAND  Final   Special Requests   Final    BOTTLES DRAWN AEROBIC AND ANAEROBIC Blood Culture results may not be optimal due to an excessive volume of blood received in culture bottles   Culture   Final    NO GROWTH 2 DAYS Performed at [facility],  [address] [place], [place] [address]    Report Status PENDING  Incomplete  Culture, blood (routine x 2)     Status: Abnormal   Collection Time: 10/11/19  6:52 AM   Specimen: BLOOD  Result Value Ref Range Status   Specimen Description   Final    BLOOD LEFT ANTECUBITAL Performed at [facility], [address], [place], [place] [address]    Special Requests   Final    BOTTLES DRAWN AEROBIC AND ANAEROBIC Blood Culture results may not be optimal due to an excessive volume of blood received in culture bottles Performed at [facility], [address] [place], [place] [address]    Culture  Setup Time   Final    GRAM POSITIVE COCCI AEROBIC BOTTLE ONLY CRITICAL RESULT CALLED TO, READ BACK BY AND VERIFIED WITH: JODY BAREFOOT AT [id number] 10/12/2019.PMF Performed at [facility], [address] [place], [place] [address]    Culture (A)  Final    STAPHYLOCOCCUS SPECIES (COAGULASE NEGATIVE) THE SIGNIFICANCE OF ISOLATING  THIS ORGANISM FROM A SINGLE SET OF BLOOD CULTURES WHEN MULTIPLE SETS ARE DRAWN IS UNCERTAIN. PLEASE NOTIFY THE MICROBIOLOGY DEPARTMENT WITHIN ONE WEEK IF SPECIATION AND SENSITIVITIES ARE REQUIRED. Performed at [facility], [place] [address] [place], [place] [address]    Report Status 10/13/2019 FINAL  Final  Culture, respiratory (tracheal aspirate)     Status: None   Collection Time: 10/11/19 10:57 AM   Specimen: Tracheal Aspirate; Respiratory  Result Value Ref Range Status   Specimen Description   Final    TRACHEAL ASPIRATE Performed at [facility], [address] [place], [place] [address]    Special Requests   Final    NONE Performed at [facility], [place], [place] [address]    Gram Stain NO WBC SEEN RARE GRAM POSITIVE COCCI IN PAIRS   Final   Culture   Final    Consistent with normal respiratory flora. Performed at [facility], [place] [address], [place], [place] [address]     Report Status 10/13/2019 FINAL  Final    Coagulation Studies: No results for input(s): LABPROT, INR in the last 72 hours.  Urinalysis: No results for input(s): COLORURINE, LABSPEC, PHURINE, GLUCOSEU, HGBUR, BILIRUBINUR, KETONESUR, PROTEINUR, UROBILINOGEN, NITRITE, LEUKOCYTESUR in the last 72 hours.  Invalid input(s): APPERANCEUR    Imaging: Dg Chest Port 1 View  Result Date: 10/12/2019 CLINICAL DATA:  Respiratory failure EXAM: PORTABLE CHEST 1 VIEW COMPARISON:  10/11/2019, 10/01/2019, 09/24/2019 FINDINGS: Removal of endotracheal tube. Low lung volumes. Patchy focus of atelectasis at the left base. Borderline to mild cardiomegaly. Aortic atherosclerosis. No pneumothorax. Removal of esophageal tube. IMPRESSION: 1. Removal of endotracheal and esophageal tubes. 2. Low lung volumes with atelectasis at the left base. Mild cardiomegaly. Electronically Signed   By: Donavan Foil M.D.   On: 10/12/2019 01:00     Medications:   . sodium chloride 10 mL/hr at 10/11/19 1300  . sodium chloride    . sodium chloride    . dextrose 20 mL/hr at 10/13/19 1200   . amLODipine  10 mg Oral Daily  . calcium acetate  1,334 mg Oral TID WC  . carvedilol  12.5 mg Oral BID WC  . Chlorhexidine Gluconate Cloth  6 each Topical Q0600  . clopidogrel  75 mg Oral Daily  . [START ON 10/14/2019] epoetin (EPOGEN/PROCRIT) injection  2,000 Units Subcutaneous Q T,Th,Sa-HD  . famotidine  20 mg Oral Daily  . heparin  5,000 Units Subcutaneous Q8H  . hydrALAZINE  50 mg Oral Q8H  . lidocaine  1 patch Transdermal Q24H  . losartan  50 mg Oral Daily  . pravastatin  40 mg Oral q1800  . torsemide  40 mg Oral Once per day on Sun Mon Wed Fri   sodium chloride, sodium chloride, sodium chloride, acetaminophen **OR** acetaminophen, acetaminophen, alteplase, benzonatate, diphenhydrAMINE, glycopyrrolate **OR** glycopyrrolate **OR** glycopyrrolate, heparin, HYDROmorphone (DILAUDID) injection, ipratropium-albuterol, lidocaine (PF),  lidocaine-prilocaine, ondansetron (ZOFRAN) IV, oxyCODONE-acetaminophen, pentafluoroprop-tetrafluoroeth, polyvinyl alcohol  Assessment/ Plan:  [name] is a 73 y.o. white female with end stage renal disease on hemodialysis, hypertension, diabetes mellitus type II, overactive bladder, gout, COPD, congestive heart failure, coronary artery disease, aortic atherosclerosis admitted to [facility] on 10/11/2019 for Acute pulmonary edema ([place]) [J81.0] Acute respiratory failure with hypoxia ([place]) [J96.01] Sepsis, due to unspecified organism, unspecified whether acute organ dysfunction present ([place]) [A41.9]  [facility] 60kg  #. ESRD: with volume overload/acute exacerbation of congestive heart failure/aortic stenosis on admission.  Emergent dialysis on admission Evaluate daily for dialysis need.  Ultrafiltration limited by aortic stenosis.  -Patient to be dialyzed again today given altered outpatient schedule for Thanksgiving.  #. Anemia of CKD: . Status post PRBC blood transfusion on 11/13 Lab Results  Component Value Date   HGB 8.6 (L) 10/12/2019   -Patient be maintained on Epogen during dialysis treatments for treatment of anemia.  #. SHPTH Lab Results  Component Value Date   CALCIUM 8.2 (L) 10/13/2019   PHOS 4.1 10/13/2019   -Continue calcium acetate 2 tablets p.o. 3 times daily with meals.      LOS: 2 Kem Parcher 11/23/20201:24 PM

## 2019-10-14 ENCOUNTER — Encounter (HOSPITAL_COMMUNITY): Payer: Self-pay | Admitting: General Practice

## 2019-10-14 ENCOUNTER — Inpatient Hospital Stay (HOSPITAL_COMMUNITY)
Admission: AD | Admit: 2019-10-14 | Discharge: 2019-10-17 | DRG: 286 | Disposition: A | Discharge status: Skilled Nursing Facility | Payer: Medicare Other | Attending: Cardiovascular Disease | Admitting: Cardiovascular Disease

## 2019-10-14 ENCOUNTER — Encounter (HOSPITAL_COMMUNITY)
Admission: AD | Disposition: A | Discharge status: Skilled Nursing Facility | Payer: Self-pay | Source: Home / Self Care | Attending: Cardiovascular Disease

## 2019-10-14 ENCOUNTER — Other Ambulatory Visit: Payer: Self-pay

## 2019-10-14 DIAGNOSIS — N2581 Secondary hyperparathyroidism of renal origin: Secondary | ICD-10-CM | POA: Diagnosis present

## 2019-10-14 DIAGNOSIS — N3281 Overactive bladder: Secondary | ICD-10-CM | POA: Diagnosis present

## 2019-10-14 DIAGNOSIS — Z8249 Family history of ischemic heart disease and other diseases of the circulatory system: Secondary | ICD-10-CM | POA: Diagnosis not present

## 2019-10-14 DIAGNOSIS — I10 Essential (primary) hypertension: Secondary | ICD-10-CM | POA: Diagnosis present

## 2019-10-14 DIAGNOSIS — Z992 Dependence on renal dialysis: Secondary | ICD-10-CM | POA: Diagnosis not present

## 2019-10-14 DIAGNOSIS — D638 Anemia in other chronic diseases classified elsewhere: Secondary | ICD-10-CM | POA: Diagnosis present

## 2019-10-14 DIAGNOSIS — Z7951 Long term (current) use of inhaled steroids: Secondary | ICD-10-CM | POA: Diagnosis not present

## 2019-10-14 DIAGNOSIS — Z7189 Other specified counseling: Secondary | ICD-10-CM | POA: Diagnosis not present

## 2019-10-14 DIAGNOSIS — Z66 Do not resuscitate: Secondary | ICD-10-CM | POA: Diagnosis present

## 2019-10-14 DIAGNOSIS — J449 Chronic obstructive pulmonary disease, unspecified: Secondary | ICD-10-CM | POA: Diagnosis present

## 2019-10-14 DIAGNOSIS — Z7902 Long term (current) use of antithrombotics/antiplatelets: Secondary | ICD-10-CM

## 2019-10-14 DIAGNOSIS — F419 Anxiety disorder, unspecified: Secondary | ICD-10-CM | POA: Diagnosis present

## 2019-10-14 DIAGNOSIS — Z8349 Family history of other endocrine, nutritional and metabolic diseases: Secondary | ICD-10-CM

## 2019-10-14 DIAGNOSIS — Z515 Encounter for palliative care: Secondary | ICD-10-CM | POA: Diagnosis present

## 2019-10-14 DIAGNOSIS — E785 Hyperlipidemia, unspecified: Secondary | ICD-10-CM | POA: Diagnosis present

## 2019-10-14 DIAGNOSIS — J810 Acute pulmonary edema: Secondary | ICD-10-CM | POA: Diagnosis present

## 2019-10-14 DIAGNOSIS — I350 Nonrheumatic aortic (valve) stenosis: Secondary | ICD-10-CM | POA: Diagnosis present

## 2019-10-14 DIAGNOSIS — Z20828 Contact with and (suspected) exposure to other viral communicable diseases: Secondary | ICD-10-CM | POA: Diagnosis present

## 2019-10-14 DIAGNOSIS — Z886 Allergy status to analgesic agent status: Secondary | ICD-10-CM

## 2019-10-14 DIAGNOSIS — J9691 Respiratory failure, unspecified with hypoxia: Secondary | ICD-10-CM | POA: Diagnosis present

## 2019-10-14 DIAGNOSIS — E1169 Type 2 diabetes mellitus with other specified complication: Secondary | ICD-10-CM | POA: Diagnosis present

## 2019-10-14 DIAGNOSIS — G4733 Obstructive sleep apnea (adult) (pediatric): Secondary | ICD-10-CM | POA: Diagnosis present

## 2019-10-14 DIAGNOSIS — Z79899 Other long term (current) drug therapy: Secondary | ICD-10-CM | POA: Diagnosis not present

## 2019-10-14 DIAGNOSIS — I132 Hypertensive heart and chronic kidney disease with heart failure and with stage 5 chronic kidney disease, or end stage renal disease: Secondary | ICD-10-CM | POA: Diagnosis present

## 2019-10-14 DIAGNOSIS — I700 Atherosclerosis of aorta: Secondary | ICD-10-CM | POA: Diagnosis present

## 2019-10-14 DIAGNOSIS — I5033 Acute on chronic diastolic (congestive) heart failure: Secondary | ICD-10-CM | POA: Diagnosis present

## 2019-10-14 DIAGNOSIS — D631 Anemia in chronic kidney disease: Secondary | ICD-10-CM

## 2019-10-14 DIAGNOSIS — I2584 Coronary atherosclerosis due to calcified coronary lesion: Secondary | ICD-10-CM | POA: Diagnosis present

## 2019-10-14 DIAGNOSIS — N186 End stage renal disease: Secondary | ICD-10-CM | POA: Diagnosis present

## 2019-10-14 DIAGNOSIS — E1122 Type 2 diabetes mellitus with diabetic chronic kidney disease: Secondary | ICD-10-CM | POA: Diagnosis present

## 2019-10-14 DIAGNOSIS — I2510 Atherosclerotic heart disease of native coronary artery without angina pectoris: Secondary | ICD-10-CM | POA: Diagnosis present

## 2019-10-14 DIAGNOSIS — Z8709 Personal history of other diseases of the respiratory system: Secondary | ICD-10-CM

## 2019-10-14 DIAGNOSIS — I25110 Atherosclerotic heart disease of native coronary artery with unstable angina pectoris: Secondary | ICD-10-CM | POA: Diagnosis not present

## 2019-10-14 HISTORY — PX: RIGHT/LEFT HEART CATH AND CORONARY ANGIOGRAPHY: CATH118266

## 2019-10-14 LAB — CBC WITH DIFFERENTIAL/PLATELET
Abs Immature Granulocytes: 0.01 10*3/uL (ref 0.00–0.07)
Basophils Absolute: 0 10*3/uL (ref 0.0–0.1)
Basophils Relative: 0 %
Eosinophils Absolute: 0.1 10*3/uL (ref 0.0–0.5)
Eosinophils Relative: 3 %
HCT: 24.3 % — ABNORMAL LOW (ref 36.0–46.0)
Hemoglobin: 7.5 g/dL — ABNORMAL LOW (ref 12.0–15.0)
Immature Granulocytes: 0 %
Lymphocytes Relative: 17 %
Lymphs Abs: 0.7 10*3/uL (ref 0.7–4.0)
MCH: 29.2 pg (ref 26.0–34.0)
MCHC: 30.9 g/dL (ref 30.0–36.0)
MCV: 94.6 fL (ref 80.0–100.0)
Monocytes Absolute: 0.4 10*3/uL (ref 0.1–1.0)
Monocytes Relative: 10 %
Neutro Abs: 3.1 10*3/uL (ref 1.7–7.7)
Neutrophils Relative %: 70 %
Platelets: 107 10*3/uL — ABNORMAL LOW (ref 150–400)
RBC: 2.57 MIL/uL — ABNORMAL LOW (ref 3.87–5.11)
RDW: 14.9 % (ref 11.5–15.5)
WBC: 4.4 10*3/uL (ref 4.0–10.5)
nRBC: 0 % (ref 0.0–0.2)

## 2019-10-14 LAB — CBC
HCT: 27.5 % — ABNORMAL LOW (ref 36.0–46.0)
Hemoglobin: 8.8 g/dL — ABNORMAL LOW (ref 12.0–15.0)
MCH: 30.4 pg (ref 26.0–34.0)
MCHC: 32 g/dL (ref 30.0–36.0)
MCV: 95.2 fL (ref 80.0–100.0)
Platelets: 117 10*3/uL — ABNORMAL LOW (ref 150–400)
RBC: 2.89 MIL/uL — ABNORMAL LOW (ref 3.87–5.11)
RDW: 14.7 % (ref 11.5–15.5)
WBC: 5.6 10*3/uL (ref 4.0–10.5)
nRBC: 0 % (ref 0.0–0.2)

## 2019-10-14 LAB — POCT I-STAT 7, (LYTES, BLD GAS, ICA,H+H)
Acid-Base Excess: 8 mmol/L — ABNORMAL HIGH (ref 0.0–2.0)
Bicarbonate: 32.7 mmol/L — ABNORMAL HIGH (ref 20.0–28.0)
Calcium, Ion: 1.1 mmol/L — ABNORMAL LOW (ref 1.15–1.40)
HCT: 25 % — ABNORMAL LOW (ref 36.0–46.0)
Hemoglobin: 8.5 g/dL — ABNORMAL LOW (ref 12.0–15.0)
O2 Saturation: 100 %
Potassium: 4.4 mmol/L (ref 3.5–5.1)
Sodium: 136 mmol/L (ref 135–145)
TCO2: 34 mmol/L — ABNORMAL HIGH (ref 22–32)
pCO2 arterial: 49.1 mmHg — ABNORMAL HIGH (ref 32.0–48.0)
pH, Arterial: 7.432 (ref 7.350–7.450)
pO2, Arterial: 191 mmHg — ABNORMAL HIGH (ref 83.0–108.0)

## 2019-10-14 LAB — GLUCOSE, CAPILLARY
Glucose-Capillary: 86 mg/dL (ref 70–99)
Glucose-Capillary: 97 mg/dL (ref 70–99)
Glucose-Capillary: 83 mg/dL (ref 70–99)
Glucose-Capillary: 86 mg/dL (ref 70–99)
Glucose-Capillary: 89 mg/dL (ref 70–99)

## 2019-10-14 LAB — CREATININE, SERUM
Creatinine, Ser: 4.27 mg/dL — ABNORMAL HIGH (ref 0.44–1.00)
GFR calc Af Amer: 11 mL/min — ABNORMAL LOW (ref >60)
GFR calc non Af Amer: 10 mL/min — ABNORMAL LOW (ref >60)

## 2019-10-14 LAB — POCT I-STAT EG7
Acid-Base Excess: 8 mmol/L — ABNORMAL HIGH (ref 0.0–2.0)
Bicarbonate: 33.7 mmol/L — ABNORMAL HIGH (ref 20.0–28.0)
Calcium, Ion: 1.12 mmol/L — ABNORMAL LOW (ref 1.15–1.40)
HCT: 25 % — ABNORMAL LOW (ref 36.0–46.0)
Hemoglobin: 8.5 g/dL — ABNORMAL LOW (ref 12.0–15.0)
O2 Saturation: 77 %
Potassium: 4.5 mmol/L (ref 3.5–5.1)
Sodium: 135 mmol/L (ref 135–145)
TCO2: 35 mmol/L — ABNORMAL HIGH (ref 22–32)
pCO2, Ven: 51.9 mmHg (ref 44.0–60.0)
pH, Ven: 7.42 (ref 7.250–7.430)
pO2, Ven: 41 mmHg (ref 32.0–45.0)

## 2019-10-14 LAB — BASIC METABOLIC PANEL
Anion gap: 9 (ref 5–15)
BUN: 17 mg/dL (ref 8–23)
CO2: 34 mmol/L — ABNORMAL HIGH (ref 22–32)
Calcium: 8.2 mg/dL — ABNORMAL LOW (ref 8.9–10.3)
Chloride: 97 mmol/L — ABNORMAL LOW (ref 98–111)
Creatinine, Ser: 3.37 mg/dL — ABNORMAL HIGH (ref 0.44–1.00)
GFR calc Af Amer: 15 mL/min — ABNORMAL LOW (ref >60)
GFR calc non Af Amer: 13 mL/min — ABNORMAL LOW (ref >60)
Glucose, Bld: 83 mg/dL (ref 70–99)
Potassium: 4.2 mmol/L (ref 3.5–5.1)
Sodium: 140 mmol/L (ref 135–145)

## 2019-10-14 LAB — MAGNESIUM: Magnesium: 1.7 mg/dL (ref 1.7–2.4)

## 2019-10-14 SURGERY — RIGHT/LEFT HEART CATH AND CORONARY ANGIOGRAPHY
Anesthesia: LOCAL

## 2019-10-14 MED ORDER — SODIUM CHLORIDE 0.9% FLUSH: 3.0000 mL | Freq: Two times a day (BID) | INTRAVENOUS | Status: DC

## 2019-10-14 MED ORDER — CALCIUM ACETATE (PHOS BINDER) 667 MG PO CAPS
1334.0000 mg | ORAL_CAPSULE | Freq: Three times a day (TID) | ORAL | Status: DC
  Administered 2019-10-15 – 2019-10-17 (×7): 1334 mg via ORAL
  Filled 2019-10-14 (×8): qty 2

## 2019-10-14 MED ORDER — HYDRALAZINE HCL 20 MG/ML IJ SOLN: 10.0000 mg | INTRAMUSCULAR | Status: AC | PRN

## 2019-10-14 MED ORDER — SODIUM CHLORIDE 0.9% FLUSH: 3.0000 mL | INTRAVENOUS | Status: DC | PRN

## 2019-10-14 MED ORDER — HYDROMORPHONE HCL 1 MG/ML IJ SOLN
INTRAMUSCULAR | Status: AC
  Filled 2019-10-14: qty 0.5

## 2019-10-14 MED ORDER — BENZONATATE 100 MG PO CAPS: 100.0000 mg | ORAL_CAPSULE | Freq: Two times a day (BID) | ORAL | Status: DC | PRN

## 2019-10-14 MED ORDER — TORSEMIDE 20 MG PO TABS
40.0000 mg | ORAL_TABLET | ORAL | Status: DC
  Administered 2019-10-15: 40 mg via ORAL
  Filled 2019-10-14: qty 2

## 2019-10-14 MED ORDER — LABETALOL HCL 5 MG/ML IV SOLN: 10.0000 mg | INTRAVENOUS | Status: AC | PRN

## 2019-10-14 MED ORDER — HYDRALAZINE HCL 20 MG/ML IJ SOLN
INTRAMUSCULAR | Status: AC
  Filled 2019-10-14: qty 1

## 2019-10-14 MED ORDER — HEPARIN (PORCINE) 25000 UT/250ML-% IV SOLN
650.0000 [IU]/h | INTRAVENOUS | Status: DC
  Administered 2019-10-15: 650 [IU]/h via INTRAVENOUS
  Filled 2019-10-14: qty 250

## 2019-10-14 MED ORDER — IOHEXOL 350 MG/ML SOLN
INTRAVENOUS | Status: DC | PRN
  Administered 2019-10-14: 55 mL

## 2019-10-14 MED ORDER — LIDOCAINE 5 % EX PTCH
1.0000 | MEDICATED_PATCH | CUTANEOUS | Status: DC
  Administered 2019-10-15 – 2019-10-16 (×2): 1 via TRANSDERMAL
  Filled 2019-10-14 (×2): qty 1

## 2019-10-14 MED ORDER — MIDAZOLAM HCL 2 MG/2ML IJ SOLN
INTRAMUSCULAR | Status: AC
  Filled 2019-10-14: qty 2

## 2019-10-14 MED ORDER — HEPARIN (PORCINE) IN NACL 1000-0.9 UT/500ML-% IV SOLN
INTRAVENOUS | Status: DC | PRN
  Administered 2019-10-14 (×2): 500 mL

## 2019-10-14 MED ORDER — CLOPIDOGREL BISULFATE 75 MG PO TABS
75.0000 mg | ORAL_TABLET | Freq: Every day | ORAL | Status: DC
  Administered 2019-10-14 – 2019-10-17 (×4): 75 mg via ORAL
  Filled 2019-10-14 (×4): qty 1

## 2019-10-14 MED ORDER — MAGNESIUM SULFATE 2 GM/50ML IV SOLN
2.0000 g | Freq: Once | INTRAVENOUS | Status: AC
  Administered 2019-10-14: 2 g via INTRAVENOUS
  Filled 2019-10-14: qty 50

## 2019-10-14 MED ORDER — SODIUM CHLORIDE 0.9 % IV SOLN
INTRAVENOUS | Status: DC
  Administered 2019-10-14: 15:00:00 via INTRAVENOUS

## 2019-10-14 MED ORDER — ACETAMINOPHEN 325 MG PO TABS: 650.0000 mg | ORAL_TABLET | ORAL | Status: DC | PRN

## 2019-10-14 MED ORDER — SODIUM CHLORIDE 0.9 % IV SOLN: 250.0000 mL | INTRAVENOUS | Status: DC | PRN

## 2019-10-14 MED ORDER — CHLORHEXIDINE GLUCONATE CLOTH 2 % EX PADS
6.0000 | MEDICATED_PAD | Freq: Every day | CUTANEOUS | Status: DC
  Administered 2019-10-15 – 2019-10-17 (×3): 6 via TOPICAL

## 2019-10-14 MED ORDER — LIDOCAINE HCL (PF) 1 % IJ SOLN
INTRAMUSCULAR | Status: AC
  Filled 2019-10-14: qty 30

## 2019-10-14 MED ORDER — HYDROMORPHONE HCL 1 MG/ML IJ SOLN: 0.5000 mg | Freq: Four times a day (QID) | INTRAMUSCULAR | Status: DC | PRN

## 2019-10-14 MED ORDER — IPRATROPIUM-ALBUTEROL 0.5-2.5 (3) MG/3ML IN SOLN: 3.0000 mL | Freq: Four times a day (QID) | RESPIRATORY_TRACT | Status: DC | PRN

## 2019-10-14 MED ORDER — ONDANSETRON HCL 4 MG/2ML IJ SOLN
4.0000 mg | Freq: Four times a day (QID) | INTRAMUSCULAR | Status: DC | PRN
  Administered 2019-10-15 – 2019-10-16 (×2): 4 mg via INTRAVENOUS
  Filled 2019-10-14 (×2): qty 2

## 2019-10-14 MED ORDER — AMLODIPINE BESYLATE 10 MG PO TABS
10.0000 mg | ORAL_TABLET | Freq: Every day | ORAL | Status: DC
  Administered 2019-10-14 – 2019-10-17 (×4): 10 mg via ORAL
  Filled 2019-10-14 (×4): qty 1

## 2019-10-14 MED ORDER — OXYCODONE-ACETAMINOPHEN 5-325 MG PO TABS
1.0000 | ORAL_TABLET | Freq: Four times a day (QID) | ORAL | Status: DC | PRN
  Administered 2019-10-14 – 2019-10-17 (×5): 1 via ORAL
  Filled 2019-10-14 (×5): qty 1

## 2019-10-14 MED ORDER — HYDRALAZINE HCL 50 MG PO TABS
50.0000 mg | ORAL_TABLET | Freq: Three times a day (TID) | ORAL | Status: DC
  Administered 2019-10-14 – 2019-10-17 (×9): 50 mg via ORAL
  Filled 2019-10-14 (×9): qty 1

## 2019-10-14 MED ORDER — MIDAZOLAM HCL 2 MG/2ML IJ SOLN
INTRAMUSCULAR | Status: DC | PRN
  Administered 2019-10-14 (×2): 0.5 mg via INTRAVENOUS

## 2019-10-14 MED ORDER — FENTANYL CITRATE (PF) 100 MCG/2ML IJ SOLN
INTRAMUSCULAR | Status: AC
  Filled 2019-10-14: qty 2

## 2019-10-14 MED ORDER — HEPARIN (PORCINE) IN NACL 1000-0.9 UT/500ML-% IV SOLN
INTRAVENOUS | Status: AC
  Filled 2019-10-14: qty 1000

## 2019-10-14 MED ORDER — LOSARTAN POTASSIUM 50 MG PO TABS
50.0000 mg | ORAL_TABLET | Freq: Every day | ORAL | Status: DC
  Administered 2019-10-14 – 2019-10-17 (×4): 50 mg via ORAL
  Filled 2019-10-14 (×4): qty 1

## 2019-10-14 MED ORDER — ONDANSETRON HCL 4 MG/2ML IJ SOLN: 4.0000 mg | Freq: Four times a day (QID) | INTRAMUSCULAR | Status: DC | PRN

## 2019-10-14 MED ORDER — FAMOTIDINE 20 MG PO TABS
20.0000 mg | ORAL_TABLET | Freq: Every day | ORAL | Status: DC
  Administered 2019-10-15 – 2019-10-17 (×3): 20 mg via ORAL
  Filled 2019-10-14 (×4): qty 1

## 2019-10-14 MED ORDER — HYDROMORPHONE HCL 1 MG/ML IJ SOLN
0.5000 mg | INTRAMUSCULAR | Status: DC | PRN
  Administered 2019-10-14 – 2019-10-16 (×5): 0.5 mg via INTRAVENOUS
  Filled 2019-10-14 (×4): qty 0.5

## 2019-10-14 MED ORDER — PRAVASTATIN SODIUM 40 MG PO TABS
40.0000 mg | ORAL_TABLET | Freq: Every day | ORAL | Status: DC
  Administered 2019-10-15 – 2019-10-16 (×2): 40 mg via ORAL
  Filled 2019-10-14 (×2): qty 1

## 2019-10-14 MED ORDER — CARVEDILOL 12.5 MG PO TABS
12.5000 mg | ORAL_TABLET | Freq: Two times a day (BID) | ORAL | Status: DC
  Administered 2019-10-15 – 2019-10-17 (×5): 12.5 mg via ORAL
  Filled 2019-10-14 (×5): qty 1

## 2019-10-14 MED ORDER — FENTANYL CITRATE (PF) 100 MCG/2ML IJ SOLN
INTRAMUSCULAR | Status: DC | PRN
  Administered 2019-10-14: 25 ug via INTRAVENOUS

## 2019-10-14 MED ORDER — SODIUM CHLORIDE 0.9% FLUSH
3.0000 mL | Freq: Two times a day (BID) | INTRAVENOUS | Status: DC
  Administered 2019-10-14 – 2019-10-17 (×6): 3 mL via INTRAVENOUS

## 2019-10-14 MED ORDER — HEPARIN SODIUM (PORCINE) 5000 UNIT/ML IJ SOLN
5000.0000 [IU] | Freq: Three times a day (TID) | INTRAMUSCULAR | Status: DC
  Filled 2019-10-14: qty 1

## 2019-10-14 MED ORDER — DEXTROSE 5 % IV SOLN
INTRAVENOUS | Status: DC
  Administered 2019-10-14 – 2019-10-15 (×3): via INTRAVENOUS

## 2019-10-14 MED ORDER — HYDRALAZINE HCL 20 MG/ML IJ SOLN
INTRAMUSCULAR | Status: DC | PRN
  Administered 2019-10-14: 10 mg via INTRAVENOUS

## 2019-10-14 MED ORDER — LIDOCAINE HCL (PF) 1 % IJ SOLN
INTRAMUSCULAR | Status: DC | PRN
  Administered 2019-10-14: 15 mL

## 2019-10-14 SURGICAL SUPPLY — 14 items
CATH 5FR JL3.5 JR4 ANG PIG MP (CATHETERS) ×2 IMPLANT
CATH INFINITI 5FR AL1 (CATHETERS) ×2 IMPLANT
CATH INFINITI 5FR MPB2 (CATHETERS) ×2 IMPLANT
CATH SWAN GANZ 7F STRAIGHT (CATHETERS) ×2 IMPLANT
KIT HEART LEFT (KITS) ×2 IMPLANT
PACK CARDIAC CATHETERIZATION (CUSTOM PROCEDURE TRAY) ×2 IMPLANT
SHEATH PINNACLE 5F 10CM (SHEATH) ×2 IMPLANT
SHEATH PINNACLE 7F 10CM (SHEATH) ×2 IMPLANT
SHEATH PROBE COVER 6X72 (BAG) ×2 IMPLANT
TRANSDUCER W/STOPCOCK (MISCELLANEOUS) ×2 IMPLANT
TUBING CIL FLEX 10 FLL-RA (TUBING) ×2 IMPLANT
WIRE EMERALD 3MM-J .025X260CM (WIRE) ×2 IMPLANT
WIRE EMERALD 3MM-J .035X150CM (WIRE) ×2 IMPLANT
WIRE EMERALD ST .035X260CM (WIRE) ×2 IMPLANT

## 2019-10-14 NOTE — H&P (View-Only) (Signed)
[facility]   MULTIDISCIPLINARY HEART VALVE TEAM  Date:  10/14/2019   ID:  Phyllis [name], DOB 1946/05/06, MRN [id number]  PCP:  [name], MD   CC: Shortness of Breath   HISTORY OF PRESENT ILLNESS: Phyllis [name] is a 73 y.o. female who presents for evaluation of severe aortic stenosis, referred by Dr Rockey Situ.  The patient was recently hospitalized with congestive heart failure.  Outpatient TAVR consultation was arranged, but she was rehospitalized with acute pulmonary edema over the weekend complicated by PEA cardiac arrest.  She has recovered and presents in transfer from [facility] for inpatient structural heart consultation.  The patient has had end-stage renal disease on hemodialysis for about 3 years.  She lives alone in [place].  She has been able to drive herself to outpatient dialysis over the years.  The patient was first told of a heart murmur about 1 year ago.  She had an echocardiogram during hospital admission for atypical chest pain in June 2020.  This study showed normal LV systolic function and moderate aortic stenosis with a mean transvalvular gradient of 25 mmHg and calculated aortic valve area of 0.98 cm.  The patient has been rehospitalized with congestive heart failure and there has been concern that aortic stenosis might be contributing to progressive symptoms of heart failure.  She was hospitalized in July 2020 and again earlier this month with heart failure and volume overload.  During her recent hospitalization, she developed flash pulmonary edema requiring intubation and daily hemodialysis to remove fluid.  It has been felt that her euvolemic window has become very narrow in the setting of aortic stenosis and end-stage renal disease.  She still makes urine and takes torsemide on nondialysis days.  After the patient's admission this month, she was discharged to short-term skilled nursing for close observation and rehab.   She was again hospitalized in mid November with recurrent pulmonary edema requiring emergent hemodialysis.  She was evaluated by cardiology and outpatient TAVR consultation was arranged.  She was discharged on October 08, 2019.  Unfortunately she was readmitted November 21 with acute respiratory distress and flash pulmonary edema complicated by PEA arrest requiring brief CPR and mechanical ventilation.  Again she underwent emergent hemodialysis and recovered quickly.  She now transfers here for further evaluation.  The patient describes shortness of breath with activity.  She does not remember the events leading up to her most recent hospitalization.  She has not had any chest pain, chest pressure, lightheadedness, or syncope.  She has been anemic and has had recent blood transfusion.  She denies any changes in her bowel habits and specifically denies melena or hematochezia.  The patient has had chronic chest pain and is felt to have costochondritis.  She is now having more severe pain after undergoing CPR/chest compressions.  Past Medical History:  Diagnosis Date   (HFpEF) heart failure with preserved ejection fraction ([place])    a. TTE 01/2014: nl LV sys fxn, no valvular abnormalities; b. TTE 11/16: nl EF, mild LVH;  c. 04/2019 Echo: EF 60-65%. DD. Nl RV fxn. Mod AS. 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   Allergy    Anemia of chronic disease    Anxiety    Aortic atherosclerosis (HCC)    Asthma    Chronic back pain    COPD (chronic obstructive pulmonary disease) ([place])    Diabetes mellitus with complication ([place])    ESRD on hemodialysis ([place])  a. Tues/Sat; b. 2/2 small kidneys   Essential hypertension    Fistula    lower left arm   GERD (gastroesophageal reflux disease)    Gout    History of exercise stress test    a. 01/2014: no evidence of ischemia; b. Lexiscan 08/2015: no sig ischemia, severe GI uptake artifact, low risk; c. CPET @ [facility] 09/2016: exercised 3 min  12 sec on bike without incline, 2.28 METs, VO2 of 8.1, 48% of predicted, indicating mod to sev functional impairment, evidence of blunted HR, stroke volume, and BP augmentation as well as ventilation-perfusion mismatch with exercise   HLD (hyperlipidemia)    Non-obstructive Carotid arterial disease ([place])    a. 12/2017: <50% bilat ICA dzs.   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 by 09/2019 echo b. 04/2019 Echo: Mod AS.   Sleep apnea     Current Facility-Administered Medications  Medication Dose Route Frequency Provider Last Rate Last Dose   0.9 %  sodium chloride infusion  250 mL Intravenous PRN Idolina Primer, Ryan M, PA-C       acetaminophen (TYLENOL) tablet 650 mg  650 mg Oral Q4H PRN Christell Faith M, PA-C       amLODipine (NORVASC) tablet 10 mg  10 mg Oral Daily Dunn, Ryan M, PA-C       benzonatate (TESSALON) capsule 100 mg  100 mg Oral BID PRN Christell Faith M, PA-C       calcium acetate (PHOSLO) capsule 1,334 mg  1,334 mg Oral TID WC Dunn, Ryan M, PA-C       carvedilol (COREG) tablet 12.5 mg  12.5 mg Oral BID WC Dunn, Ryan M, PA-C       [START ON 10/15/2019] Chlorhexidine Gluconate Cloth 2 % PADS 6 each  6 each Topical Q0600 Rise Mu, PA-C       clopidogrel (PLAVIX) tablet 75 mg  75 mg Oral Daily Dunn, Ryan M, PA-C       dextrose 5 % solution   Intravenous Continuous Dunn, Ryan M, PA-C       famotidine (PEPCID) tablet 20 mg  20 mg Oral Daily Dunn, Ryan M, PA-C       heparin injection 5,000 Units  5,000 Units Subcutaneous Q8H Dunn, Ryan M, PA-C       hydrALAZINE (APRESOLINE) tablet 50 mg  50 mg Oral Q8H Dunn, Ryan M, PA-C       HYDROmorphone (DILAUDID) injection 0.5 mg  0.5 mg Intravenous Q6H PRN Idolina Primer, Ryan M, PA-C       ipratropium-albuterol (DUONEB) 0.5-2.5 (3) MG/3ML nebulizer solution 3 mL  3 mL Nebulization Q6H PRN Christell Faith M, PA-C       lidocaine (LIDODERM) 5 % 1 patch  1 patch Transdermal Q24H Dunn, Ryan M, PA-C        losartan (COZAAR) tablet 50 mg  50 mg Oral Daily Dunn, Ryan M, PA-C       ondansetron [facility]) injection 4 mg  4 mg Intravenous Q6H PRN Christell Faith M, PA-C       oxyCODONE-acetaminophen (PERCOCET/ROXICET) 5-325 MG per tablet 1 tablet  1 tablet Oral Q6H PRN Christell Faith M, PA-C       pravastatin (PRAVACHOL) tablet 40 mg  40 mg Oral q1800 Rise Mu, PA-C       sodium chloride flush (NS) 0.9 % injection 3 mL  3 mL Intravenous Q12H Rise Mu, PA-C  sodium chloride flush (NS) 0.9 % injection 3 mL  3 mL Intravenous PRN Christell Faith M, PA-C       [START ON 10/15/2019] torsemide [facility]) tablet 40 mg  40 mg Oral Once per day on Sun Mon Wed Fri Dunn, Ryan M, PA-C        ALLERGIES:   Enalapril maleate; Nitrofurantoin; Sulfamethoxazole-trimethoprim; 2,4-d dimethylamine (amisol); Baclofen; Neosporin [neomycin-bacitracin zn-polymyx]; Quinine; Ultram [tramadol]; Zocor [simvastatin]; Bactrim [sulfamethoxazole-trimethoprim]; Levodopa; Macrodantin [nitrofurantoin macrocrystal]; and Quinine derivatives   SOCIAL HISTORY:  The patient  reports that she has never smoked. She has never used smokeless tobacco. She reports that she does not drink alcohol or use drugs.   FAMILY HISTORY:  The patient's family history includes Heart attack in her brother; Heart attack (age of onset: 43) in her father; Heart disease in her brother and father; Hyperlipidemia in her father and mother; Hypertension in her father and mother.   REVIEW OF SYSTEMS:  Positive for generalized fatigue.   All other systems are reviewed and negative.   PHYSICAL EXAM: VS:  Ht 4\' 10"  (1.473 m)    Wt 57 kg    BMI 26.26 kg/m  , BMI Body mass index is 26.26 kg/m. GEN: Well nourished, well developed, in no acute distress HEENT: normal Neck: No JVD. carotids 2+ with bilateral bruits Cardiac: The heart is RRR with 3/6 harsh mid-to-late peaking systolic murmur, A2 present. No edema.  Femoral pulses 2+ and equal bilaterally Respiratory:  clear  to auscultation bilaterally GI: soft, nontender, nondistended, + BS MS: no deformity or atrophy.  There is a left arm AV fistula Skin: warm and dry, no rash Neuro:  Strength and sensation are intact Psych: euthymic mood, full affect  EKG:  EKG from 10-11-2019 reviewed and demonstrates sinus cardia with heart rate 108 bpm, nonspecific ST abnormality.  RECENT LABS: 11/28/2018: TSH 1.789 10/11/2019: ALT 17; B Natriuretic Peptide 2,087.0 10/14/2019: BUN 17; Creatinine, Ser 3.37; Hemoglobin 7.5; Magnesium 1.7; Platelets 107; Potassium 4.2; Sodium 140  No results found for requested labs within last 8760 hours.   Estimated Creatinine Clearance: 11.1 mL/min (A) (by C-G formula based on SCr of 3.37 mg/dL (H)).   Wt Readings from Last 3 Encounters:  10/14/19 57 kg  10/14/19 61.1 kg  10/08/19 57.2 kg     CARDIAC STUDIES:  Echo:  IMPRESSIONS    1. Left ventricular ejection fraction, by visual estimation, is 60 to 65%. The left ventricle has normal function. There is mildly increased left ventricular hypertrophy.  2. Global right ventricle has normal systolic function.The right ventricular size is normal. No increase in right ventricular wall thickness.  3. Left atrial size was mild-moderately dilated.  4. Mild mitral valve regurgitation.  5. The tricuspid valve is normal in structure. Tricuspid valve regurgitation is not demonstrated.  6. The aortic valve was not well visualized. Measurements as detailed below, Moderate aortic valve stenosis by gradients, severe stenosis by estimated AVA.  7. TR signal is inadequate for assessing pulmonary artery systolic pressure.  8. Aortic valve mean gradient measures 20.6 mmHg.  9. Aortic valve peak gradient measures 32.0 mmHg. 10. Aortic valve area, by VTI measures 0.68 cm.  FINDINGS  Left Ventricle: Left ventricular ejection fraction, by visual estimation, is 60 to 65%. The left ventricle has normal function. There is mildly increased left  ventricular hypertrophy. Concentric left ventricular hypertrophy. Normal left atrial pressure.  Right Ventricle: The right ventricular size is normal. No increase in right ventricular wall thickness. Global RV systolic function  is has normal systolic function. The tricuspid regurgitant velocity is 2.16 m/s, and with an assumed right atrial pressure  of 10 mmHg, the estimated right ventricular systolic pressure is TR signal is inadequate for assessing PA pressure at 28.7 mmHg.  Left Atrium: Left atrial size was mild-moderately dilated.  Right Atrium: Right atrial size was normal in size  Pericardium: There is no evidence of pericardial effusion.  Mitral Valve: The mitral valve is normal in structure. No evidence of mitral valve stenosis by observation. Mild mitral valve regurgitation.  Tricuspid Valve: The tricuspid valve is normal in structure. Tricuspid valve regurgitation is not demonstrated.  Aortic Valve: The aortic valve was not well visualized. Aortic valve regurgitation is not visualized. Moderate aortic stenosis is present. Aortic valve mean gradient measures 20.6 mmHg. Aortic valve peak gradient measures 32.0 mmHg. Aortic valve area, by  VTI measures 0.68 cm.  Pulmonic Valve: The pulmonic valve was normal in structure. Pulmonic valve regurgitation is not visualized.  Aorta: The aortic root, ascending aorta and aortic arch are all structurally normal, with no evidence of dilitation or obstruction.  Venous: The inferior vena cava is normal in size with greater than 50% respiratory variability, suggesting right atrial pressure of 3 mmHg.  IAS/Shunts: No atrial level shunt detected by color flow Doppler. No ventricular septal defect is seen or detected. There is no evidence of an atrial septal defect.     LEFT VENTRICLE PLAX 2D LVIDd:         4.67 cm  Diastology LVIDs:         3.21 cm  LV e' lateral:   3.70 cm/s LV PW:         1.29 cm  LV E/e' lateral: 43.2 LV IVS:         1.13 cm  LV e' medial:    4.46 cm/s LVOT diam:     2.00 cm  LV E/e' medial:  35.9 LV SV:         60 ml LV SV Index:   36.80 LVOT Area:     3.14 cm    RIGHT VENTRICLE RV Basal diam:  3.48 cm RV S prime:     20.20 cm/s TAPSE (M-mode): 4.1 cm  LEFT ATRIUM              Index       RIGHT ATRIUM           Index LA diam:        3.90 cm  2.45 cm/m  RA Area:     18.90 cm LA Vol (A2C):   107.0 ml 67.10 ml/m RA Volume:   53.80 ml  33.74 ml/m LA Vol (A4C):   58.4 ml  36.63 ml/m LA Biplane Vol: 82.9 ml  51.99 ml/m  AORTIC VALVE                    PULMONIC VALVE AV Area (Vmax):    0.71 cm     PV Vmax:        1.05 m/s AV Area (Vmean):   0.68 cm     PV Peak grad:   4.4 mmHg AV Area (VTI):     0.68 cm     RVOT Peak grad: 6 mmHg AV Vmax:           282.80 cm/s AV Vmean:          214.000 cm/s AV VTI:            0.622  m AV Peak Grad:      32.0 mmHg AV Mean Grad:      20.6 mmHg LVOT Vmax:         64.30 cm/s LVOT Vmean:        46.600 cm/s LVOT VTI:          0.134 m LVOT/AV VTI ratio: 0.22   AORTA Ao Root diam: 1.90 cm  MITRAL VALVE                         TRICUSPID VALVE MV Area (PHT): 4.31 cm              TR Peak grad:   18.7 mmHg MV PHT:        51.04 msec            TR Vmax:        227.00 cm/s MV Decel Time: 176 msec MV E velocity: 160.00 cm/s 103 cm/s  SHUNTS MV A velocity: 153.00 cm/s 70.3 cm/s Systemic VTI:  0.13 m MV E/A ratio:  1.05        1.5       Systemic Diam: 2.00 cm   Cardiac Cath: pending  CTA studies: pending  STS RISK CALCULATOR: Risk of Mortality:  15.919%  Renal Failure:  NA  Permanent Stroke:  5.465%  Prolonged Ventilation:  41.636%  DSW Infection:  0.183%  Reoperation:  6.295%  Morbidity or Mortality:  46.193%  Short Length of Stay:  7.187%  Long Length of Stay:  27.884%     ASSESSMENT AND PLAN: 73 year old woman with end-stage renal disease, presenting with recurrent acute pulmonary edema and PEA arrest with a background of severe  aortic stenosis.  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  The patient has now been hospitalized 3 times in the last month with acute heart failure and pulmonary edema.  The patient's echocardiogram is personally reviewed.  She has normal LV systolic function with LVEF 60 to 65%.  Peak and mean transaortic valvular gradients are 32 and 21 mmHg, respectively.  The aortic valve dimensionless index is 0.22.  Calculated aortic valve area is 0.68 cm.  The patient has difficult acoustic windows, but it appears that the right and noncoronary cusps are partially fused.  All of the valve leaflets are calcified and restricted but there is more mobility of the left cusp.  Based on findings of her echocardiogram, she may have severe, stage D3 paradoxical low flow low gradient aortic stenosis.  I have reviewed the natural history of aortic stenosis with the patient today. We have discussed the limitations of medical therapy and the poor prognosis associated with symptomatic aortic stenosis. We have reviewed potential treatment options, including palliative medical therapy, conventional surgical aortic valve replacement, and transcatheter aortic valve replacement. We discussed treatment options in the context of the patient's specific comorbid medical conditions.    The patient desires any treatment that might help her quality of life.  I think it is clear that she would be at very high risk of conventional surgical aortic valve replacement.  She might be a candidate for TAVR, depending on preoperative testing results.  I have recommended right and left heart catheterization as a next step.  This will likely be a diagnostic only study in the setting of her anemia. I have reviewed the risks, indications, and alternatives to cardiac catheterization, possible angioplasty,  and stenting with the patient. Risks  include but are not limited to bleeding, infection, vascular injury, stroke, myocardial infection, arrhythmia, kidney injury, radiation-related injury in the case of prolonged fluoroscopy use, emergency cardiac surgery, and death. The patient understands the risks of serious complication is 1-2 in [id number] with diagnostic cardiac cath and 1-2% or less with angioplasty/stenting.  Depending on cardiac catheterization results, will obtain a gated cardiac CTA and a CTA of the chest, abdomen, and pelvis to evaluate whether TAVR is anatomically feasible.  She will undergo multidisciplinary evaluation including formal cardiac surgical consultation.   Deatra James 10/14/2019 2:38 PM     [place] [address]  [phone] (office) [phone] (fax)

## 2019-10-14 NOTE — Progress Notes (Signed)
Progress Note  [name] Date of Encounter: 10/14/2019  Primary Cardiologist: Rockey Situ  Subjective   Feels well this morning.  No shortness of breath or chest pain.  Underwent dialysis on 11/23.  Inpatient Medications    Scheduled Meds: . amLODipine  10 mg Oral Daily  . calcium acetate  1,334 mg Oral TID WC  . carvedilol  12.5 mg Oral BID WC  . Chlorhexidine Gluconate Cloth  6 each Topical Q0600  . clopidogrel  75 mg Oral Daily  . famotidine  20 mg Oral Daily  . heparin  5,000 Units Subcutaneous Q8H  . hydrALAZINE  50 mg Oral Q8H  . lidocaine  1 patch Transdermal Q24H  . losartan  50 mg Oral Daily  . pravastatin  40 mg Oral q1800  . torsemide  40 mg Oral Once per day on Sun Mon Wed Fri   Continuous Infusions: . sodium chloride 10 mL/hr at 10/11/19 1300  . dextrose 20 mL/hr at 10/13/19 1405   PRN Meds: sodium chloride, acetaminophen **OR** acetaminophen, acetaminophen, benzonatate, diphenhydrAMINE, glycopyrrolate **OR** glycopyrrolate **OR** glycopyrrolate, HYDROmorphone (DILAUDID) injection, ipratropium-albuterol, ondansetron (ZOFRAN) IV, oxyCODONE-acetaminophen, polyvinyl alcohol   Vital Signs    Vitals:   10/14/19 1140 10/14/19 1145 10/14/19 1149 10/14/19 1151  BP:  (!) 146/77 (!) 146/77 (!) 146/77  Pulse:  65 65 65  Resp:  13 13 13   Temp: 98.3 F (36.8 C) 98.3 F (36.8 C) 98.3 F (36.8 C) 98.3 F (36.8 C)  TempSrc:      SpO2:    98%  Weight:      Height:        Intake/Output Summary (Last 24 hours) at 10/14/2019 1155 Last data filed at 10/14/2019 0600 Gross per 24 hour  Intake 544.87 ml  Output 2000 ml  Net -1455.13 ml   Filed Weights   10/13/19 0443 10/13/19 1430 10/14/19 0500  Weight: 64 kg 64 kg 61.1 kg    Telemetry    Sinus rhythm- Personally Reviewed  ECG    No new tracings- Personally Reviewed  Physical Exam   GEN: No acute distress.   Neck: No JVD. Cardiac: RRR, III/VI systolic murmur along the RUSB, no  rubs, or gallops.  Respiratory:  Coarse breath sounds bilaterally.  GI: Soft, nontender, non-distended.   MS: No edema; No deformity. Neuro:  Alert and oriented x 3; Nonfocal.  Psych: Normal affect.  Labs    Chemistry Recent Labs  Lab 10/11/19 0626 10/12/19 0411 10/13/19 0640 10/14/19 0338  NA 134* 139 136 140  K 5.0 4.1 4.3 4.2  CL 96* 100 97* 97*  CO2 22 26 26  34*  GLUCOSE 222* 110* 83 83  BUN 28* 18 34* 17  CREATININE 5.56* 3.62* 5.04* 3.37*  CALCIUM 8.5* 8.7* 8.2* 8.2*  PROT 6.0*  --   --   --   ALBUMIN 3.6  --  3.1*  --   AST 37  --   --   --   ALT 17  --   --   --   ALKPHOS 63  --   --   --   BILITOT 0.8  --   --   --   GFRNONAA 7* 12* 8* 13*  GFRAA 8* 14* 9* 15*  ANIONGAP 16* 13 13 9      Hematology Recent Labs  Lab 10/11/19 0626 10/12/19 0411 10/14/19 0338  WBC 12.4* 8.6 4.4  RBC 3.46* 2.89* 2.57*  HGB 10.5* 8.6* 7.5*  HCT  32.8* 26.8* 24.3*  MCV 94.8 92.7 94.6  MCH 30.3 29.8 29.2  MCHC 32.0 32.1 30.9  RDW 15.9* 15.6* 14.9  PLT 272 113* 107*    Cardiac EnzymesNo results for input(s): TROPONINI in the last 168 hours. No results for input(s): TROPIPOC in the last 168 hours.   BNP Recent Labs  Lab 10/11/19 0626  BNP 2,087.0*     DDimer No results for input(s): DDIMER in the last 168 hours.   Radiology    No results found.  Cardiac Studies   2D echo 09/21/2019:  1. Left ventricular ejection fraction, by visual estimation, is 60 to 65%. The left ventricle has normal function. There is mildly increased left ventricular hypertrophy.  2. Global right ventricle has normal systolic function.The right ventricular size is normal. No increase in right ventricular wall thickness.  3. Left atrial size was mild-moderately dilated.  4. Mild mitral valve regurgitation.  5. The tricuspid valve is normal in structure. Tricuspid valve regurgitation is not demonstrated.  6. The aortic valve was not well visualized. Measurements as detailed below, Moderate  aortic valve stenosis by gradients, severe stenosis by estimated AVA.  7. TR signal is inadequate for assessing pulmonary artery systolic pressure.  8. Aortic valve mean gradient measures 20.6 mmHg.  9. Aortic valve peak gradient measures 32.0 mmHg. 10. Aortic valve area, by VTI measures 0.68 cm.  Patient Profile     73 y.o. female with history of severe aortic stenosis, ESRD on HD TTS, anemia of chronic disease, aortic atherosclerosis, DM, HTN, HLD, obesity, COPD, OSA, asthma, anxiety, and physical deconditioning who we are seeing for flash pulmonary edema with respiratory failure complicated by PEA arrest in the setting of severe aortic stenosis.  Assessment & Plan    1.  Recurrent flash pulmonary edema: -She has had multiple episodes of flash pulmonary edema requiring admission and urgent dialysis with significant fluid shifts -Likely multifactorial including end-stage renal disease on hemodialysis, anemia, and severe aortic stenosis -Symptoms improved following emergent dialysis -Volume managed by HD as outlined below -Cannot exclude ischemic heart disease  2.  Respiratory failure complicated by PEA arrest: -Patient was brought in from [facility] with respiratory failure complicated by PEA arrest requiring brief CPR on 11/21 along with mechanical ventilation -COVID-19 antigen was positive though swab was negative -Initially, plan was to make patient DNR and comfort care given poor prognosis and recurrent presentation -However, upon extubation patient was alert and oriented with good glottic status and wished to proceed with aggressive care  3.  Severe aortic stenosis: -Unfortunately, she has been unable to stand to the hospital long enough to undergo outpatient TAVR work-up -Case has been discussed with structural heart team with recommendation to transfer to [facility], under our service in the setting of COVID-19 pandemic, for right and left cardiac cath and evaluation for potential TAVR   4.  Elevated troponin: -High-sensitivity troponin of 64 on 11/21 suspected to be supply demand ischemia in the setting of recurrent flash pulmonary edema, respiratory failure, PEA arrest, severe aortic stenosis, anemia of chronic disease, and end-stage renal disease -Ischemic heart disease will need to be ruled out as outlined above with diagnostic cardiac cath -It appears she was placed on Plavix sometime in 2019 in place of aspirin  5.  Anemia of chronic disease: -Likely contributing to her overall hemodynamic instability -Status post packed RBC on 11/13  6.  ESRD on HD TTS: -She last underwent dialysis on Monday, 11/23 -She has been followed by nephrology while  admitted with recommendation to hold off on dialysis today, 11/24 with recommendation for the patient to undergo dialysis on 11/25 if she is transferred to [facility] -Following transfer, nephrology will need to be consulted by our group at [facility]  For questions or updates, please contact [facility] Please consult [email or web address] for contact info under Cardiology/STEMI.    Signed, Christell Faith, PA-C [place] Pager: [phone] 10/14/2019, 11:55 AM

## 2019-10-14 NOTE — Interval H&P Note (Signed)
Cath Lab Visit (complete for each Cath Lab visit)  Clinical Evaluation Leading to the Procedure:   ACS: No.  Non-ACS:    Anginal Classification: CCS III  Anti-ischemic medical therapy: Minimal Therapy (1 class of medications)  Non-Invasive Test Results: No non-invasive testing performed  Prior CABG: No previous CABG      History and Physical Interval Note:  10/14/2019 3:35 PM  Phyllis [name]  has presented today for surgery, with the diagnosis of TAVR evaluation.  The various methods of treatment have been discussed with the patient and family. After consideration of risks, benefits and other options for treatment, the patient has consented to  Procedure(s): RIGHT/LEFT HEART CATH AND CORONARY ANGIOGRAPHY (N/A) as a surgical intervention.  The patient's history has been reviewed, patient examined, no change in status, stable for surgery.  I have reviewed the patient's chart and labs.  Questions were answered to the patient's satisfaction.     Belva Crome III

## 2019-10-14 NOTE — Progress Notes (Signed)
ANTICOAGULATION CONSULT NOTE - Initial Consult  Pharmacy Consult for Heparin Indication: chest pain/ACS  Allergies  Allergen Reactions  . Enalapril Maleate Other (See Comments)    Other reaction(s): Headache  . Nitrofurantoin Swelling and Rash    Other Reaction: swelling of body  . Sulfamethoxazole-Trimethoprim Swelling  . 2,4-D Dimethylamine (Amisol) Rash and Other (See Comments)    Other Reaction: h/a  . Baclofen Other (See Comments) and Nausea Only    lightheadness ,drowsiness , muscle weakness , twitching in hands   . Neosporin [Neomycin-Bacitracin Zn-Polymyx] Other (See Comments) and Rash    Other Reaction: irritation Skin irritation   . Quinine Nausea And Vomiting, Rash and Other (See Comments)    Other Reaction: Vomiting, rash, h/a, vision  . Ultram [Tramadol] Palpitations  . Zocor [Simvastatin] Other (See Comments) and Rash    Other Reaction: muscle spasms Muscle pain and spasms  . Bactrim [Sulfamethoxazole-Trimethoprim] Swelling  . Levodopa Other (See Comments)    Reaction: unknown  . Macrodantin [Nitrofurantoin Macrocrystal] Swelling  . Quinine Derivatives Other (See Comments)    Vertigo,nausea vomiting blurred vision headache ears sensitivity     Patient Measurements: Height: 4\' 10"  (147.3 cm) Weight: 125 lb 10.6 oz (57 kg) IBW/kg (Calculated) : 40.9 Heparin Dosing Weight: 52.9 kg  Vital Signs: Temp: 98.6 F (37 C) (11/24 1240) Temp Source: Oral (11/24 1240) BP: 161/39 (11/24 1700) Pulse Rate: 87 (11/24 1700)  Labs: Recent Labs    10/12/19 0411 10/13/19 0640 10/14/19 0338 10/14/19 1451 10/14/19 1600 10/14/19 1601  HGB 8.6*  --  7.5* 8.8* 8.5* 8.5*  HCT 26.8*  --  24.3* 27.5* 25.0* 25.0*  PLT 113*  --  107* 117*  --   --   CREATININE 3.62* 5.04* 3.37* 4.27*  --   --     Estimated Creatinine Clearance: 8.8 mL/min (A) (by C-G formula based on SCr of 4.27 mg/dL (H)).   Medical History: Severe aortic valve stenosis, ESRD (TTS HD), recurrent  flash pulmonary edema, anemia, aortic atherosclerosis, anxiety, DM, HTN, HLD, obesity, asthma, COPD, OSA, physical deconditioning; pt recently hospitalized with CHF; outpt TAVR consultation was arranged, but she was rehospitalized  Assessment: 73 yr old female admitted to [facility] on 11/21 with respiratory failure complicated by PEA arrest and subsequently transferred to [facility] for further evaluation. Pt is S/P cardiac cath this afternoon. Pharmacy is consulted to start heparin infusion 8 hrs after sheath pulled; sheath pulled at 17:30 PM, per RN.  Cath report:  Severe, calcified ostial to distal left main, 80%.  Severe ostial LAD, 99% followed by an eccentric 50 to 70% proximal LAD after the first diagonal.  Ostial to proximal 60% circumflex.  The second obtuse marginal contains segmental 60 to 70% narrowing.  The circumflex is dominant.  Nondominant RCA.  Moderately severe to severe aortic stenosis with valve area of 0.99 cm, peak to peak gradient 20 mmHg, mean gradient 28 mmHg, based upon cardiac output of 7.2 L/min and corresponding cardiac index of 4.83 L/min/m  Severe ascending and descending aortic calcification  Pt being evaluated for surgical options with SAVR and CABG (considered extremely high-risk PCI).  H/H 8.5/25.0, platelets 117  Goal of Therapy:  Heparin level 0.3-0.7 units/ml Monitor platelets by anticoagulation protocol: Yes   Plan:  Start heparin infusion at 650 units/hr at 01:30 AM on 11/25 (8 hrs after sheath pulled) Check 8-hr heparin level Monitor daily heparin level, CBC Monitor for signs/symptoms of bleeding  Gillermina Hu, PharmD, BCPS, [facility] Clinical Pharmacist 10/14/2019,5:34  PM

## 2019-10-14 NOTE — Progress Notes (Signed)
Walked in room at 0654 to assess groin, groin bleeding, held pressure x15 min called 2H rn for assist has no answer from cath lab. Pressure dressing applied, pedal pulse remain strong palpable. Paged dr cooper regarding rt groin bleeding, no hematoma palpated. Will continue to monitor. reencouraged pt if cough to splint area with her hand.

## 2019-10-14 NOTE — Plan of Care (Signed)

## 2019-10-14 NOTE — Progress Notes (Signed)
Phyllis [name] is a 73 y.o. female patient admitted from ED awake, alert - oriented  X 4 - no acute distress noted.  VSS - Blood pressure (!) 161/39, pulse 87, resp. rate 20, height 4\' 10"  (1.473 m), weight 57 kg, SpO2 96 %.    IV in place, occlusive dsg intact without redness.  Orientation to room, and floor completed with information packet given to patient/family.  Patient declined safety video at this time.  Admission INP armband ID verified with patient/family, and in place.    SR up x 2, fall assessment complete, with patient and family able to verbalize understanding of risk associated with falls, and verbalized understanding to call nsg before up out of bed.    Call light within reach, patient able to voice, and demonstrate understanding. Skin to skin assessment completed with second RN. Skin integrity documented.  Skin, clean-dry- intact without evidence of skin tears.    No evidence of skin break down noted on exam.     Will cont to eval and treat per MD orders.  Howard Pouch, RN 10/14/2019 5:56 PM

## 2019-10-14 NOTE — Progress Notes (Addendum)
Site area: rt groin fa and fv sheaths pulled by Carlean Jews Site Prior to Removal:  Level 0 Pressure Applied For: 30 minutes Manual:   yes Patient Status During Pull:  stable Post Pull Site:  Level 0 Post Pull Instructions Given:  yes Post Pull Pulses Present: rt dp palpable Dressing Applied:  Gauze and tegaderm Bedrest begins @ 1730 Comments:

## 2019-10-14 NOTE — Progress Notes (Signed)
Called cath lab at 1440; confirmed ok to send patient to cath lab with D5 infusion running.  patient left floor for cath lab at 1520   1521 called cath lab and informed of patient currently experiencing chest pain due to prior CPR and ostechondritis, that patient had elevated BP and BP meds given via Burt Knack verbal ok during face to face interaction.

## 2019-10-14 NOTE — Consult Note (Signed)
[facility]   MULTIDISCIPLINARY HEART VALVE TEAM  Date:  10/14/2019   ID:  [name], DOB October 07, 1946, MRN [id number]  PCP:  [name], MD   CC: Shortness of Breath   HISTORY OF PRESENT ILLNESS: Phyllis [name] is a 73 y.o. female who presents for evaluation of severe aortic stenosis, referred by Dr Rockey Situ.  The patient was recently hospitalized with congestive heart failure.  Outpatient TAVR consultation was arranged, but she was rehospitalized with acute pulmonary edema over the weekend complicated by PEA cardiac arrest.  She has recovered and presents in transfer from [facility] for inpatient structural heart consultation.  The patient has had end-stage renal disease on hemodialysis for about 3 years.  She lives alone in [place].  She has been able to drive herself to outpatient dialysis over the years.  The patient was first told of a heart murmur about 1 year ago.  She had an echocardiogram during hospital admission for atypical chest pain in June 2020.  This study showed normal LV systolic function and moderate aortic stenosis with a mean transvalvular gradient of 25 mmHg and calculated aortic valve area of 0.98 cm.  The patient has been rehospitalized with congestive heart failure and there has been concern that aortic stenosis might be contributing to progressive symptoms of heart failure.  She was hospitalized in July 2020 and again earlier this month with heart failure and volume overload.  During her recent hospitalization, she developed flash pulmonary edema requiring intubation and daily hemodialysis to remove fluid.  It has been felt that her euvolemic window has become very narrow in the setting of aortic stenosis and end-stage renal disease.  She still makes urine and takes torsemide on nondialysis days.  After the patient's admission this month, she was discharged to short-term skilled nursing for close observation and rehab.   She was again hospitalized in mid November with recurrent pulmonary edema requiring emergent hemodialysis.  She was evaluated by cardiology and outpatient TAVR consultation was arranged.  She was discharged on October 08, 2019.  Unfortunately she was readmitted November 21 with acute respiratory distress and flash pulmonary edema complicated by PEA arrest requiring brief CPR and mechanical ventilation.  Again she underwent emergent hemodialysis and recovered quickly.  She now transfers here for further evaluation.  The patient describes shortness of breath with activity.  She does not remember the events leading up to her most recent hospitalization.  She has not had any chest pain, chest pressure, lightheadedness, or syncope.  She has been anemic and has had recent blood transfusion.  She denies any changes in her bowel habits and specifically denies melena or hematochezia.  The patient has had chronic chest pain and is felt to have costochondritis.  She is now having more severe pain after undergoing CPR/chest compressions.  Past Medical History:  Diagnosis Date   (HFpEF) heart failure with preserved ejection fraction ([place])    a. TTE 01/2014: nl LV sys fxn, no valvular abnormalities; b. TTE 11/16: nl EF, mild LVH;  c. 04/2019 Echo: EF 60-65%. DD. Nl RV fxn. Mod AS. 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   Allergy    Anemia of chronic disease    Anxiety    Aortic atherosclerosis (HCC)    Asthma    Chronic back pain    COPD (chronic obstructive pulmonary disease) ([place])    Diabetes mellitus with complication ([place])    ESRD on hemodialysis ([place])  a. Tues/Sat; b. 2/2 small kidneys   Essential hypertension    Fistula    lower left arm   GERD (gastroesophageal reflux disease)    Gout    History of exercise stress test    a. 01/2014: no evidence of ischemia; b. Lexiscan 08/2015: no sig ischemia, severe GI uptake artifact, low risk; c. CPET @ [facility] 09/2016: exercised 3 min  12 sec on bike without incline, 2.28 METs, VO2 of 8.1, 48% of predicted, indicating mod to sev functional impairment, evidence of blunted HR, stroke volume, and BP augmentation as well as ventilation-perfusion mismatch with exercise   HLD (hyperlipidemia)    Non-obstructive Carotid arterial disease ([place])    a. 12/2017: <50% bilat ICA dzs.   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 by 09/2019 echo b. 04/2019 Echo: Mod AS.   Sleep apnea     Current Facility-Administered Medications  Medication Dose Route Frequency Provider Last Rate Last Dose   0.9 %  sodium chloride infusion  250 mL Intravenous PRN Idolina Primer, Ryan M, PA-C       acetaminophen (TYLENOL) tablet 650 mg  650 mg Oral Q4H PRN Christell Faith M, PA-C       amLODipine (NORVASC) tablet 10 mg  10 mg Oral Daily Dunn, Ryan M, PA-C       benzonatate (TESSALON) capsule 100 mg  100 mg Oral BID PRN Christell Faith M, PA-C       calcium acetate (PHOSLO) capsule 1,334 mg  1,334 mg Oral TID WC Dunn, Ryan M, PA-C       carvedilol (COREG) tablet 12.5 mg  12.5 mg Oral BID WC Dunn, Ryan M, PA-C       [START ON 10/15/2019] Chlorhexidine Gluconate Cloth 2 % PADS 6 each  6 each Topical Q0600 Rise Mu, PA-C       clopidogrel (PLAVIX) tablet 75 mg  75 mg Oral Daily Dunn, Ryan M, PA-C       dextrose 5 % solution   Intravenous Continuous Dunn, Ryan M, PA-C       famotidine (PEPCID) tablet 20 mg  20 mg Oral Daily Dunn, Ryan M, PA-C       heparin injection 5,000 Units  5,000 Units Subcutaneous Q8H Dunn, Ryan M, PA-C       hydrALAZINE (APRESOLINE) tablet 50 mg  50 mg Oral Q8H Dunn, Ryan M, PA-C       HYDROmorphone (DILAUDID) injection 0.5 mg  0.5 mg Intravenous Q6H PRN Idolina Primer, Ryan M, PA-C       ipratropium-albuterol (DUONEB) 0.5-2.5 (3) MG/3ML nebulizer solution 3 mL  3 mL Nebulization Q6H PRN Christell Faith M, PA-C       lidocaine (LIDODERM) 5 % 1 patch  1 patch Transdermal Q24H Dunn, Ryan M, PA-C        losartan (COZAAR) tablet 50 mg  50 mg Oral Daily Dunn, Ryan M, PA-C       ondansetron [facility]) injection 4 mg  4 mg Intravenous Q6H PRN Christell Faith M, PA-C       oxyCODONE-acetaminophen (PERCOCET/ROXICET) 5-325 MG per tablet 1 tablet  1 tablet Oral Q6H PRN Christell Faith M, PA-C       pravastatin (PRAVACHOL) tablet 40 mg  40 mg Oral q1800 Rise Mu, PA-C       sodium chloride flush (NS) 0.9 % injection 3 mL  3 mL Intravenous Q12H Rise Mu, PA-C  sodium chloride flush (NS) 0.9 % injection 3 mL  3 mL Intravenous PRN Christell Faith M, PA-C       [START ON 10/15/2019] torsemide [facility]) tablet 40 mg  40 mg Oral Once per day on Sun Mon Wed Fri Dunn, Ryan M, PA-C        ALLERGIES:   Enalapril maleate; Nitrofurantoin; Sulfamethoxazole-trimethoprim; 2,4-d dimethylamine (amisol); Baclofen; Neosporin [neomycin-bacitracin zn-polymyx]; Quinine; Ultram [tramadol]; Zocor [simvastatin]; Bactrim [sulfamethoxazole-trimethoprim]; Levodopa; Macrodantin [nitrofurantoin macrocrystal]; and Quinine derivatives   SOCIAL HISTORY:  The patient  reports that she has never smoked. She has never used smokeless tobacco. She reports that she does not drink alcohol or use drugs.   FAMILY HISTORY:  The patient's family history includes Heart attack in her brother; Heart attack (age of onset: 76) in her father; Heart disease in her brother and father; Hyperlipidemia in her father and mother; Hypertension in her father and mother.   REVIEW OF SYSTEMS:  Positive for generalized fatigue.   All other systems are reviewed and negative.   PHYSICAL EXAM: VS:  Ht 4\' 10"  (1.473 m)    Wt 57 kg    BMI 26.26 kg/m  , BMI Body mass index is 26.26 kg/m. GEN: Well nourished, well developed, in no acute distress HEENT: normal Neck: No JVD. carotids 2+ with bilateral bruits Cardiac: The heart is RRR with 3/6 harsh mid-to-late peaking systolic murmur, A2 present. No edema.  Femoral pulses 2+ and equal bilaterally Respiratory:  clear  to auscultation bilaterally GI: soft, nontender, nondistended, + BS MS: no deformity or atrophy.  There is a left arm AV fistula Skin: warm and dry, no rash Neuro:  Strength and sensation are intact Psych: euthymic mood, full affect  EKG:  EKG from 10-11-2019 reviewed and demonstrates sinus cardia with heart rate 108 bpm, nonspecific ST abnormality.  RECENT LABS: 11/28/2018: TSH 1.789 10/11/2019: ALT 17; B Natriuretic Peptide 2,087.0 10/14/2019: BUN 17; Creatinine, Ser 3.37; Hemoglobin 7.5; Magnesium 1.7; Platelets 107; Potassium 4.2; Sodium 140  No results found for requested labs within last 8760 hours.   Estimated Creatinine Clearance: 11.1 mL/min (A) (by C-G formula based on SCr of 3.37 mg/dL (H)).   Wt Readings from Last 3 Encounters:  10/14/19 57 kg  10/14/19 61.1 kg  10/08/19 57.2 kg     CARDIAC STUDIES:  Echo:  IMPRESSIONS    1. Left ventricular ejection fraction, by visual estimation, is 60 to 65%. The left ventricle has normal function. There is mildly increased left ventricular hypertrophy.  2. Global right ventricle has normal systolic function.The right ventricular size is normal. No increase in right ventricular wall thickness.  3. Left atrial size was mild-moderately dilated.  4. Mild mitral valve regurgitation.  5. The tricuspid valve is normal in structure. Tricuspid valve regurgitation is not demonstrated.  6. The aortic valve was not well visualized. Measurements as detailed below, Moderate aortic valve stenosis by gradients, severe stenosis by estimated AVA.  7. TR signal is inadequate for assessing pulmonary artery systolic pressure.  8. Aortic valve mean gradient measures 20.6 mmHg.  9. Aortic valve peak gradient measures 32.0 mmHg. 10. Aortic valve area, by VTI measures 0.68 cm.  FINDINGS  Left Ventricle: Left ventricular ejection fraction, by visual estimation, is 60 to 65%. The left ventricle has normal function. There is mildly increased left  ventricular hypertrophy. Concentric left ventricular hypertrophy. Normal left atrial pressure.  Right Ventricle: The right ventricular size is normal. No increase in right ventricular wall thickness. Global RV systolic function  is has normal systolic function. The tricuspid regurgitant velocity is 2.16 m/s, and with an assumed right atrial pressure  of 10 mmHg, the estimated right ventricular systolic pressure is TR signal is inadequate for assessing PA pressure at 28.7 mmHg.  Left Atrium: Left atrial size was mild-moderately dilated.  Right Atrium: Right atrial size was normal in size  Pericardium: There is no evidence of pericardial effusion.  Mitral Valve: The mitral valve is normal in structure. No evidence of mitral valve stenosis by observation. Mild mitral valve regurgitation.  Tricuspid Valve: The tricuspid valve is normal in structure. Tricuspid valve regurgitation is not demonstrated.  Aortic Valve: The aortic valve was not well visualized. Aortic valve regurgitation is not visualized. Moderate aortic stenosis is present. Aortic valve mean gradient measures 20.6 mmHg. Aortic valve peak gradient measures 32.0 mmHg. Aortic valve area, by  VTI measures 0.68 cm.  Pulmonic Valve: The pulmonic valve was normal in structure. Pulmonic valve regurgitation is not visualized.  Aorta: The aortic root, ascending aorta and aortic arch are all structurally normal, with no evidence of dilitation or obstruction.  Venous: The inferior vena cava is normal in size with greater than 50% respiratory variability, suggesting right atrial pressure of 3 mmHg.  IAS/Shunts: No atrial level shunt detected by color flow Doppler. No ventricular septal defect is seen or detected. There is no evidence of an atrial septal defect.     LEFT VENTRICLE PLAX 2D LVIDd:         4.67 cm  Diastology LVIDs:         3.21 cm  LV e' lateral:   3.70 cm/s LV PW:         1.29 cm  LV E/e' lateral: 43.2 LV IVS:         1.13 cm  LV e' medial:    4.46 cm/s LVOT diam:     2.00 cm  LV E/e' medial:  35.9 LV SV:         60 ml LV SV Index:   36.80 LVOT Area:     3.14 cm    RIGHT VENTRICLE RV Basal diam:  3.48 cm RV S prime:     20.20 cm/s TAPSE (M-mode): 4.1 cm  LEFT ATRIUM              Index       RIGHT ATRIUM           Index LA diam:        3.90 cm  2.45 cm/m  RA Area:     18.90 cm LA Vol (A2C):   107.0 ml 67.10 ml/m RA Volume:   53.80 ml  33.74 ml/m LA Vol (A4C):   58.4 ml  36.63 ml/m LA Biplane Vol: 82.9 ml  51.99 ml/m  AORTIC VALVE                    PULMONIC VALVE AV Area (Vmax):    0.71 cm     PV Vmax:        1.05 m/s AV Area (Vmean):   0.68 cm     PV Peak grad:   4.4 mmHg AV Area (VTI):     0.68 cm     RVOT Peak grad: 6 mmHg AV Vmax:           282.80 cm/s AV Vmean:          214.000 cm/s AV VTI:            0.622  m AV Peak Grad:      32.0 mmHg AV Mean Grad:      20.6 mmHg LVOT Vmax:         64.30 cm/s LVOT Vmean:        46.600 cm/s LVOT VTI:          0.134 m LVOT/AV VTI ratio: 0.22   AORTA Ao Root diam: 1.90 cm  MITRAL VALVE                         TRICUSPID VALVE MV Area (PHT): 4.31 cm              TR Peak grad:   18.7 mmHg MV PHT:        51.04 msec            TR Vmax:        227.00 cm/s MV Decel Time: 176 msec MV E velocity: 160.00 cm/s 103 cm/s  SHUNTS MV A velocity: 153.00 cm/s 70.3 cm/s Systemic VTI:  0.13 m MV E/A ratio:  1.05        1.5       Systemic Diam: 2.00 cm   Cardiac Cath: pending  CTA studies: pending  STS RISK CALCULATOR: Risk of Mortality:  15.919%  Renal Failure:  NA  Permanent Stroke:  5.465%  Prolonged Ventilation:  41.636%  DSW Infection:  0.183%  Reoperation:  6.295%  Morbidity or Mortality:  46.193%  Short Length of Stay:  7.187%  Long Length of Stay:  27.884%     ASSESSMENT AND PLAN: 73 year old woman with end-stage renal disease, presenting with recurrent acute pulmonary edema and PEA arrest with a background of severe  aortic stenosis.  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  The patient has now been hospitalized 3 times in the last month with acute heart failure and pulmonary edema.  The patient's echocardiogram is personally reviewed.  She has normal LV systolic function with LVEF 60 to 65%.  Peak and mean transaortic valvular gradients are 32 and 21 mmHg, respectively.  The aortic valve dimensionless index is 0.22.  Calculated aortic valve area is 0.68 cm.  The patient has difficult acoustic windows, but it appears that the right and noncoronary cusps are partially fused.  All of the valve leaflets are calcified and restricted but there is more mobility of the left cusp.  Based on findings of her echocardiogram, she may have severe, stage D3 paradoxical low flow low gradient aortic stenosis.  I have reviewed the natural history of aortic stenosis with the patient today. We have discussed the limitations of medical therapy and the poor prognosis associated with symptomatic aortic stenosis. We have reviewed potential treatment options, including palliative medical therapy, conventional surgical aortic valve replacement, and transcatheter aortic valve replacement. We discussed treatment options in the context of the patient's specific comorbid medical conditions.    The patient desires any treatment that might help her quality of life.  I think it is clear that she would be at very high risk of conventional surgical aortic valve replacement.  She might be a candidate for TAVR, depending on preoperative testing results.  I have recommended right and left heart catheterization as a next step.  This will likely be a diagnostic only study in the setting of her anemia. I have reviewed the risks, indications, and alternatives to cardiac catheterization, possible angioplasty,  and stenting with the patient. Risks  include but are not limited to bleeding, infection, vascular injury, stroke, myocardial infection, arrhythmia, kidney injury, radiation-related injury in the case of prolonged fluoroscopy use, emergency cardiac surgery, and death. The patient understands the risks of serious complication is 1-2 in [id number] with diagnostic cardiac cath and 1-2% or less with angioplasty/stenting.  Depending on cardiac catheterization results, will obtain a gated cardiac CTA and a CTA of the chest, abdomen, and pelvis to evaluate whether TAVR is anatomically feasible.  She will undergo multidisciplinary evaluation including formal cardiac surgical consultation.   Deatra James 10/14/2019 2:38 PM     [address] [place] [address]  [phone] (office) [phone] (fax)

## 2019-10-14 NOTE — H&P (Signed)
Cardiology Admission History and Physical:   Patient ID: Phyllis [name] MRN: [id number]; DOB: May 14, 1946   Admission date: (Not on file)  Primary Care Provider: [name], MD Primary Cardiologist: Phyllis Rogue, MD  Primary Electrophysiologist:  None   Chief Complaint: Respiratory distress  Patient Profile:   Phyllis [name] is a 73 y.o. female with severe aortic valve stenosis, ESRD on HD TTS, recurrent flash pulmonary edema, recent respiratory/PEA arrest, anemia of chronic disease, aortic atherosclerosis, anxiety, DM, HTN, HLD, obesity, asthma, COPD, OSA, and physical deconditioning who was admitted to [facility] on 11/21 with respiratory failure complicated by PEA arrest and subsequently transferred to [facility] for further evaluation.  History of Present Illness:   Phyllis [name] known chronic chest pain felt to be secondary to costochondritis with negative stress testing in the past.  She has known aortic stenosis that has been followed with periodic echo.  Echo in 04/2019, during admission for atypical chest pain, showed normal LV systolic function, moderate aortic stenosis with a mean gradient 25 mmHg and valve area 0.98 cm.  No further ischemic cardiac work-up was recommended.  She was admitted to the hospital in 05/2019 with volume overload with volume managed by hemodialysis.  There was some concern she may have low flow low gradient severe aortic stenosis.  More recently, she was admitted to the hospital earlier this month with flash pulmonary edema requiring intubation for airway protection and underwent daily hemodialysis with subsequent improvement in volume status.  Repeat echo on 09/21/2019 showed an EF of 60 to 65%, mild LVH, normal RV systolic function and cavity size, mild to moderate left atrial dilatation, mild mitral regurgitation, moderate aortic valve stenosis by gradients and severe stenosis by valve area with a mean gradient of 20.6 mmHg and a valve area of  0.68 cm.  It was recommended her Lasix be changed to torsemide on non-dialysis days for further optimization of volume management.  In the setting of her aortic valve disease her euvolemic window has been quite narrow.  She was also treated with a 7-day course of antibiotic for possible pneumonia.  She was noted to be significantly deconditioned with recommendation to pursue aggressive work-up with physical therapy followed by outpatient evaluation by the structural heart team.  She was subsequently discharged to [facility] on 09/30/2019 after undergoing her regular hemodialysis.  While at [facility] she developed sudden shortness of breath while laying in the bed 2 days after her discharge and was brought back to [facility] with flash pulmonary edema requiring emergent hemodialysis.  She was noted to have a downtrending hemoglobin and underwent packed red blood cell transfusion x1.  It was recommended she undergo more aggressive dialysis and follow-up with structural heart team as outpatient.  She was subsequently discharged on 10/08/2019.  She returned to [facility] on 11/21 with respiratory distress and recurrent flash pulmonary edema complicated by PEA arrest requiring brief episode of CPR and mechanical ventilation.  She underwent emergent hemodialysis.  Initially, plans were for palliative/comfort care given her comorbid conditions and overall poor prognosis.  However, upon extubation the patient was alert and oriented and wished to pursue further aggressive care.  She has been at her baseline following this initial emergent hemodialysis.  Cardiology was consulted on 11/23 with the patient felt to have severe aortic stenosis and recommendation to transfer to [facility] for further evaluation.  This morning at [facility], patient has no complaints and states she is at her baseline.  She continues to wish to pursue  aggressive care.  Heart Pathway Score:     Past Medical History:  Diagnosis Date   (HFpEF) heart  failure with preserved ejection fraction ([place])    a. TTE 01/2014: nl LV sys fxn, no valvular abnormalities; b. TTE 11/16: nl EF, mild LVH;  c. 04/2019 Echo: EF 60-65%. DD. Nl RV fxn. Mod AS. 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   Allergy    Anemia of chronic disease    Anxiety    Aortic atherosclerosis (HCC)    Asthma    Chronic back pain    COPD (chronic obstructive pulmonary disease) ([place])    Diabetes mellitus with complication ([place])    ESRD on hemodialysis ([place])    a. Tues/Sat; b. 2/2 small kidneys   Essential hypertension    Fistula    lower left arm   GERD (gastroesophageal reflux disease)    Gout    History of exercise stress test    a. 01/2014: no evidence of ischemia; b. Lexiscan 08/2015: no sig ischemia, severe GI uptake artifact, low risk; c. CPET @ [facility] 09/2016: exercised 3 min 12 sec on bike without incline, 2.28 METs, VO2 of 8.1, 48% of predicted, indicating mod to sev functional impairment, evidence of blunted HR, stroke volume, and BP augmentation as well as ventilation-perfusion mismatch with exercise   HLD (hyperlipidemia)    Non-obstructive Carotid arterial disease ([place])    a. 12/2017: <50% bilat ICA dzs.   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 by 09/2019 echo b. 04/2019 Echo: Mod AS.   Sleep apnea     Past Surgical History:  Procedure Laterality Date   carpel tunnel     GALLBLADDER SURGERY     PERIPHERAL VASCULAR CATHETERIZATION N/A 04/12/2015   Procedure: A/V Shuntogram/Fistulagram;  Surgeon: Algernon Huxley, MD;  Location: [place] CV LAB;  Service: Cardiovascular;  Laterality: N/A;   PERIPHERAL VASCULAR CATHETERIZATION N/A 04/12/2015   Procedure: A/V Shunt Intervention;  Surgeon: Algernon Huxley, MD;  Location: [place] CV LAB;  Service: Cardiovascular;  Laterality: N/A;   PERIPHERAL VASCULAR CATHETERIZATION N/A 06/09/2015   Procedure: Dialysis/Perma Catheter Removal;   Surgeon: Katha Cabal, MD;  Location: [place] CV LAB;  Service: Cardiovascular;  Laterality: N/A;     Medications Prior to Admission: Prior to Admission medications   Medication Sig Start Date End Date Taking? Authorizing Provider  acetaminophen (TYLENOL) 500 MG tablet Take 1 tablet (500 mg total) by mouth every 6 (six) hours as needed for mild pain or fever. 09/29/19   Danford, Suann Larry, MD  albuterol (PROVENTIL HFA;VENTOLIN HFA) 108 (90 Base) MCG/ACT inhaler Inhale 2 puffs into the lungs every 6 (six) hours as needed for wheezing or shortness of breath.    [provider]  amLODipine (NORVASC) 10 MG tablet Take 10 mg by mouth daily.  12/29/13   [provider]  calcium acetate (PHOSLO) 667 MG capsule Take 1,334 mg by mouth 3 (three) times daily with meals.     [provider]  carvedilol (COREG) 12.5 MG tablet Take 12.5 mg by mouth 2 (two) times daily with a meal.  01/09/14   [provider]  cetirizine (ZYRTEC) 10 MG tablet Take 10 mg by mouth daily as needed for allergies.     [provider]  clopidogrel (PLAVIX) 75 MG tablet Take 1 tablet (75 mg total) by mouth daily. 01/18/18   Epifanio Lesches, MD  Fluticasone-Salmeterol (ADVAIR) 250-50 MCG/DOSE AEPB Inhale 1 puff into the lungs 2 (two) times daily as needed (for shortness of breath). 09/07/19   Henreitta Leber, MD  hydrALAZINE (APRESOLINE) 50 MG tablet Take 1 tablet (50 mg total) by mouth every 8 (eight) hours. 09/29/19   Danford, Suann Larry, MD  HYDROcodone-acetaminophen (NORCO) 7.5-325 MG tablet Take 1 tablet by mouth every 6 (six) hours as needed (pain). 10/08/19   Danford, Suann Larry, MD  lidocaine (LIDODERM) 5 % Place 1 patch onto the skin daily. Remove & Discard patch within 12 hours or as directed by MD 10/14/19   Flora Lipps, MD  lidocaine-prilocaine (EMLA) cream Apply 1 application topically as needed (prior to treatment).     [provider]  losartan  (COZAAR) 50 MG tablet Take 50 mg by mouth daily. 03/18/19   [provider]  omeprazole (PRILOSEC) 20 MG capsule Take 1 capsule (20 mg total) by mouth 2 (two) times daily before a meal. 09/07/19   Sainani, Belia Heman, MD  ondansetron (ZOFRAN) 4 MG tablet Take 4 mg by mouth every 8 (eight) hours as needed. 05/20/19   [provider]  oxybutynin (DITROPAN XL) 15 MG 24 hr tablet Take 15 mg by mouth daily. 12/24/17   [provider]  pravastatin (PRAVACHOL) 40 MG tablet Take 40 mg by mouth at bedtime.     [provider]  scopolamine (TRANSDERM-SCOP) 1 MG/3DAYS Place 1 patch onto the skin every 3 (three) days.    [provider]  topiramate (TOPAMAX) 25 MG tablet Take 1 tablet (25 mg total) by mouth as needed (for headaches). 09/07/19   Henreitta Leber, MD  torsemide (DEMADEX) 20 MG tablet Take 2 tablets (40 mg total) by mouth every Monday,Wednesday,Friday, and Sunday at 6 PM. 10/01/19   Danford, Suann Larry, MD     Allergies:    Allergies  Allergen Reactions   Enalapril Maleate Other (See Comments)    Other reaction(s): Headache   Nitrofurantoin Swelling and Rash    Other Reaction: swelling of body   Sulfamethoxazole-Trimethoprim Swelling   2,4-D Dimethylamine (Amisol) Rash and Other (See Comments)    Other Reaction: h/a   Baclofen Other (See Comments) and Nausea Only    lightheadness ,drowsiness , muscle weakness , twitching in hands    Neosporin [Neomycin-Bacitracin Zn-Polymyx] Other (See Comments) and Rash    Other Reaction: irritation Skin irritation    Quinine Nausea And Vomiting, Rash and Other (See Comments)    Other Reaction: Vomiting, rash, h/a, vision   Ultram [Tramadol] Palpitations   Zocor [Simvastatin] Other (See Comments) and Rash    Other Reaction: muscle spasms Muscle pain and spasms   Bactrim [Sulfamethoxazole-Trimethoprim] Swelling   Levodopa Other (See Comments)    Reaction: unknown   Macrodantin [Nitrofurantoin  Macrocrystal] Swelling   Quinine Derivatives Other (See Comments)    Vertigo,nausea vomiting blurred vision headache ears sensitivity     Social History:   Social History   Socioeconomic History   Marital status: Single    Spouse name: Not on file   Number of children: Not on file   Years of education: Not on file   Highest education level: Not on file  Occupational History   Not on file  Social Needs   Financial resource strain: Not on file   Food insecurity    Worry: Not on file    Inability: Not on file   Transportation needs    Medical: Not on file  Non-medical: Not on file  Tobacco Use   Smoking status: Never Smoker   Smokeless tobacco: Never Used  Substance and Sexual Activity   Alcohol use: No   Drug use: No   Sexual activity: Not Currently  Lifestyle   Physical activity    Days per week: Not on file    Minutes per session: Not on file   Stress: Not on file  Relationships   Social connections    Talks on phone: Not on file    Gets together: Not on file    Attends religious service: Not on file    Active member of club or organization: Not on file    Attends meetings of clubs or organizations: Not on file    Relationship status: Not on file   Intimate partner violence    Fear of current or ex partner: Not on file    Emotionally abused: Not on file    Physically abused: Not on file    Forced sexual activity: Not on file  Other Topics Concern   Not on file  Social History Narrative   Not on file    Family History:   The patient's family history includes Heart attack in her brother; Heart attack (age of onset: 49) in her father; Heart disease in her brother and father; Hyperlipidemia in her father and mother; Hypertension in her father and mother. There is no history of Breast cancer.    ROS:  Please see the history of present illness.  All other ROS reviewed and negative.     Physical Exam/Data:   Vitals:   10/14/19 1140  10/14/19 1145 10/14/19 1149 10/14/19 1151  BP:  (!) 146/77 (!) 146/77 (!) 146/77  Pulse:  65 65 65  Resp:  13 13 13   Temp: 98.3 F (36.8 C) 98.3 F (36.8 C) 98.3 F (36.8 C) 98.3 F (36.8 C)  TempSrc:      SpO2:    98%  Weight:      Height:        Intake/Output Summary (Last 24 hours) at 10/14/2019 1155 Last data filed at 10/14/2019 0600    Gross per 24 hour  Intake 544.87 ml  Output 2000 ml  Net -1455.13 ml    Last 3 Weights 10/14/2019 10/13/2019 10/13/2019  Weight (lbs) 134 lb 11.2 oz 141 lb 1.5 oz 141 lb 1.5 oz  Weight (kg) 61.1 kg 64 kg 64 kg      General:  Well nourished, well developed, in no acute distress HEENT: Normal Lymph: No adenopathy Neck: No JVD Endocrine:  No thryomegaly Vascular: No carotid bruits; FA pulses 2+ bilaterally without bruits  Cardiac:  normal S1, S2; RRR; III/VI systolic murmur at the RUSB Lungs: Coarse breath sounds bilaterally Abd: Soft, nontender, no hepatomegaly  Ext: No edema Musculoskeletal:  No deformities, BUE and BLE strength normal and equal Skin: warm and dry  Neuro:  CNs 2-12 intact, no focal abnormalities noted Psych:  Normal affect    EKG:  The ECG that was done 10/11/2019 was personally reviewed and demonstrates sinus tachycardia, 108 bpm, nonspecific IVCD, nonspecific ST-T changes  Relevant CV Studies: 2D echo 09/21/2019: 1. Left ventricular ejection fraction, by visual estimation, is 60 to 65%. The left ventricle has normal function. There is mildly increased left ventricular hypertrophy. 2. Global right ventricle has normal systolic function.The right ventricular size is normal. No increase in right ventricular wall thickness. 3. Left atrial size was mild-moderately dilated. 4. Mild mitral valve regurgitation. 5.  The tricuspid valve is normal in structure. Tricuspid valve regurgitation is not demonstrated. 6. The aortic valve was not well visualized. Measurements as detailed below, Moderate  aortic valve stenosis by gradients, severe stenosis by estimated AVA. 7. TR signal is inadequate for assessing pulmonary artery systolic pressure. 8. Aortic valve mean gradient measures 20.6 mmHg. 9. Aortic valve peak gradient measures 32.0 mmHg. 10. Aortic valve area, by VTI measures 0.68 cm.  Laboratory Data:  High Sensitivity Troponin:   Recent Labs  Lab 09/22/19 1238 09/22/19 1853 10/01/19 1650 10/01/19 1836 10/11/19 0626  TROPONINIHS 204* 185* 23* 26* 64*      Chemistry Recent Labs  Lab 10/13/19 0640 10/14/19 0338  NA 136 140  K 4.3 4.2  CL 97* 97*  CO2 26 34*  GLUCOSE 83 83  BUN 34* 17  CREATININE 5.04* 3.37*  CALCIUM 8.2* 8.2*  GFRNONAA 8* 13*  GFRAA 9* 15*  ANIONGAP 13 9    Recent Labs  Lab 10/11/19 0626 10/13/19 0640  PROT 6.0*  --   ALBUMIN 3.6 3.1*  AST 37  --   ALT 17  --   ALKPHOS 63  --   BILITOT 0.8  --    Hematology Recent Labs  Lab 10/12/19 0411 10/14/19 0338  WBC 8.6 4.4  RBC 2.89* 2.57*  HGB 8.6* 7.5*  HCT 26.8* 24.3*  MCV 92.7 94.6  MCH 29.8 29.2  MCHC 32.1 30.9  RDW 15.6* 14.9  PLT 113* 107*   BNP Recent Labs  Lab 10/11/19 0626  BNP 2,087.0*    DDimer No results for input(s): DDIMER in the last 168 hours.   Radiology/Studies:  No results found.  Assessment and Plan:   1. Recurrent flash pulmonary edema: -She has had multiple episodes of flash pulmonary edema requiring admission and urgent dialysis with significant fluid shifts -Likely multifactorial including end-stage renal disease on hemodialysis, anemia, and severe aortic stenosis -Symptoms improved following emergent dialysis -Volume managed by HD as outlined below -Cannot exclude ischemic heart disease  2.  Respiratory failure complicated by PEA arrest: -Patient was brought in from [facility] with respiratory failure complicated by PEA arrest requiring brief CPR on 11/21 along with mechanical ventilation -COVID-19 antigen was positive though swab was  negative -Initially, plan was to make patient DNR and comfort care given poor prognosis and recurrent presentation -However, upon extubation patient was alert and oriented with good glottic status and wished to proceed with aggressive care  3.  Severe aortic stenosis: -Unfortunately, she has been unable to stand to the hospital long enough to undergo outpatient TAVR work-up -Case has been discussed with structural heart team with recommendation to transfer to [facility], under our service in the setting of COVID-19 pandemic, for right and left cardiac cath and evaluation for potential TAVR  4.  Elevated troponin: -High-sensitivity troponin of 64 on 11/21 suspected to be supply demand ischemia in the setting of recurrent flash pulmonary edema, respiratory failure, PEA arrest, severe aortic stenosis, anemia of chronic disease, and end-stage renal disease -Ischemic heart disease will need to be ruled out as outlined above with diagnostic cardiac cath -It appears she was placed on Plavix sometime in 2019 in place of aspirin  5.  Anemia of chronic disease: -Likely contributing to her overall hemodynamic instability -Monitor -Status post packed RBC on 11/13  6.  ESRD on HD TTS: -She last underwent dialysis on Monday, 11/23 -She has been followed by nephrology while admitted with recommendation to hold off on dialysis today, 11/24  with recommendation for the patient to undergo dialysis on 11/25 if she is transferred to [facility] -Following transfer, nephrology will need to be consulted by our group at [facility], cardmaster was notified  Severity of Illness: The appropriate patient status for this patient is INPATIENT. Inpatient status is judged to be reasonable and necessary in order to provide the required intensity of service to ensure the patient's safety. The patient's presenting symptoms, physical exam findings, and initial radiographic and laboratory data in the context of their chronic  comorbidities is felt to place them at high risk for further clinical deterioration. Furthermore, it is not anticipated that the patient will be medically stable for discharge from the hospital within 2 midnights of admission. The following factors support the patient status of inpatient.   " The patient's presenting symptoms include as above. " The worrisome physical exam findings include as above. " The initial radiographic and laboratory data are worrisome because of as above. " The chronic co-morbidities include as above.   * I certify that at the point of admission it is my clinical judgment that the patient will require inpatient hospital care spanning beyond 2 midnights from the point of admission due to high intensity of service, high risk for further deterioration and high frequency of surveillance required.*    For questions or updates, please contact [place] Please consult [email or web address] for contact info under        Signed, Christell Faith, PA-C  10/14/2019 12:49 PM

## 2019-10-14 NOTE — CV Procedure (Signed)
   Right and left heart cath with coronary angiography via right femoral vein and artery.  Vascular access was with real-time vascular ultrasound achieving anterior wall puncture in both vessels.  Severe heavily calcified complex ostial to distal left main 85% obstructed with associated catheter damping during engagement.  Ostial to proximal LAD 99%.,  80% after the first diagonal the LAD is otherwise patent.  Dominant circumflex with first marginal arising just beyond 60 to 70% ostial to proximal circumflex.  Nondominant right coronary  Moderately severe aortic stenosis with peak to peak gradient of 20 mmHg and mean gradient 28 mmHg with calculated aortic valve area 0.99 cm.  Recommendation: Images reviewed with Dr. Burt Knack.  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  She would be a very high risk PCI candidate requiring hemodynamic support to allow orbital or rotational atherectomy debulking prior to 2 stent technique on the severely diseased left main and ostial LAD and circumflex anatomy.  Overall poor outlook.  Shared decision making concerning treatment choices with palliative care being a legitimate option.

## 2019-10-14 NOTE — Progress Notes (Signed)
[facility]  ROUNDING NOTE   Subjective:  Patient completed hemodialysis yesterday. Tolerated well. Still has severe underlying aortic stenosis.    Objective:  Vital signs in last 24 hours:  Temp:  [98.2 F (36.8 C)-98.7 F (37.1 C)] 98.2 F (36.8 C) (11/23 2000) Pulse Rate:  [60-84] 71 (11/24 0856) Resp:  [14-26] 17 (11/24 0600) BP: (123-164)/(35-75) 136/43 (11/24 0856) SpO2:  [88 %-100 %] 100 % (11/24 0600) Weight:  [61.1 kg-64 kg] 61.1 kg (11/24 0500)  Weight change: 0 kg Filed Weights   10/13/19 0443 10/13/19 1430 10/14/19 0500  Weight: 64 kg 64 kg 61.1 kg    Intake/Output: I/O last 3 completed shifts: In: 861.4 [P.O.:260; I.V.:601.4] Out: 2420 [Urine:420; Other:2000]   Intake/Output this shift:  No intake/output data recorded.   Physical Exam: General: NAD, laying in the bed  Head: [place]/AT, moist oral mucous membranes  Eyes: Anicteric  Lungs:  clear to auscultation  Heart: Regular rate and rhythm, 2/6 murmur  Abdomen:  Soft, nontender, obese  Extremities: No peripheral edema.  Neurologic: Nonfocal, alert and oriented  Skin: No lesions  Access: Left forearm AVF, good bruit    Basic Metabolic Panel: Recent Labs  Lab 10/11/19 0626 10/12/19 0411 10/13/19 0640 10/14/19 0338  NA 134* 139 136 140  K 5.0 4.1 4.3 4.2  CL 96* 100 97* 97*  CO2 22 26 26  34*  GLUCOSE 222* 110* 83 83  BUN 28* 18 34* 17  CREATININE 5.56* 3.62* 5.04* 3.37*  CALCIUM 8.5* 8.7* 8.2* 8.2*  MG  --  1.7 1.9 1.7  PHOS  --  3.4 4.1  --     Liver Function Tests: Recent Labs  Lab 10/11/19 0626 10/13/19 0640  AST 37  --   ALT 17  --   ALKPHOS 63  --   BILITOT 0.8  --   PROT 6.0*  --   ALBUMIN 3.6 3.1*   No results for input(s): LIPASE, AMYLASE in the last 168 hours. No results for input(s): AMMONIA in the last 168 hours.  CBC: Recent Labs  Lab 10/11/19 0626 10/12/19 0411 10/14/19 0338  WBC 12.4* 8.6 4.4  NEUTROABS 8.4*  --  3.1  HGB 10.5* 8.6* 7.5*   HCT 32.8* 26.8* 24.3*  MCV 94.8 92.7 94.6  PLT 272 113* 107*    Cardiac Enzymes: No results for input(s): CKTOTAL, CKMB, CKMBINDEX, TROPONINI in the last 168 hours.  BNP: Invalid input(s): POCBNP  CBG: Recent Labs  Lab 10/13/19 0741 10/13/19 1134 10/13/19 1818 10/13/19 2210 10/14/19 0717  GLUCAP 75 110* 102* 121* 86    Microbiology: Results for orders placed or performed during the hospital encounter of 10/11/19  SARS Coronavirus 2 by RT PCR (hospital order, performed in [facility] hospital lab) Nasopharyngeal Nasopharyngeal Swab     Status: None   Collection Time: 10/11/19  6:26 AM   Specimen: Nasopharyngeal Swab  Result Value Ref Range Status   SARS Coronavirus 2 NEGATIVE NEGATIVE Final    Comment: (NOTE) If result is NEGATIVE SARS-CoV-2 target nucleic acids are NOT DETECTED. The SARS-CoV-2 RNA is generally detectable in upper and lower  respiratory specimens during the acute phase of infection. The lowest  concentration of SARS-CoV-2 viral copies this assay can detect is 250  copies / mL. A negative result does not preclude SARS-CoV-2 infection  and should not be used as the sole basis for treatment or other  patient management decisions.  A negative result may occur with  improper specimen collection /  handling, submission of specimen other  than nasopharyngeal swab, presence of viral mutation(s) within the  areas targeted by this assay, and inadequate number of viral copies  (<250 copies / mL). A negative result must be combined with clinical  observations, patient history, and epidemiological information. If result is POSITIVE SARS-CoV-2 target nucleic acids are DETECTED. The SARS-CoV-2 RNA is generally detectable in upper and lower  respiratory specimens dur ing the acute phase of infection.  Positive  results are indicative of active infection with SARS-CoV-2.  Clinical  correlation with patient history and other diagnostic information is  necessary to  determine patient infection status.  Positive results do  not rule out bacterial infection or co-infection with other viruses. If result is PRESUMPTIVE POSTIVE SARS-CoV-2 nucleic acids MAY BE PRESENT.   A presumptive positive result was obtained on the submitted specimen  and confirmed on repeat testing.  While 2019 novel coronavirus  (SARS-CoV-2) nucleic acids may be present in the submitted sample  additional confirmatory testing may be necessary for epidemiological  and / or clinical management purposes  to differentiate between  SARS-CoV-2 and other Sarbecovirus currently known to infect humans.  If clinically indicated additional testing with an alternate test  methodology [id number]) is advised. The SARS-CoV-2 RNA is generally  detectable in upper and lower respiratory sp ecimens during the acute  phase of infection. The expected result is Negative. Fact Sheet for Patients:  StrictlyIdeas.no Fact Sheet for Healthcare Providers: BankingDealers.co.za This test is not yet approved or cleared by the [place] FDA and has been authorized for detection and/or diagnosis of SARS-CoV-2 by FDA under an Emergency Use Authorization (EUA).  This EUA will remain in effect (meaning this test can be used) for the duration of the COVID-19 declaration under Section 564(b)(1) of the Act, 21 U.S.C. section 360bbb-3(b)(1), unless the authorization is terminated or revoked sooner. Performed at [facility], [address] [place], [place] [address]   Culture, blood (routine x 2)     Status: None (Preliminary result)   Collection Time: 10/11/19  6:52 AM   Specimen: BLOOD  Result Value Ref Range Status   Specimen Description BLOOD BLOOD RIGHT HAND  Final   Special Requests   Final    BOTTLES DRAWN AEROBIC AND ANAEROBIC Blood Culture results may not be optimal due to an excessive volume of blood received in culture bottles   Culture   Final     NO GROWTH 2 DAYS Performed at [facility], [address], [place], [place] [address]    Report Status PENDING  Incomplete  Culture, blood (routine x 2)     Status: Abnormal   Collection Time: 10/11/19  6:52 AM   Specimen: BLOOD  Result Value Ref Range Status   Specimen Description   Final    BLOOD LEFT ANTECUBITAL Performed at [facility], [address], [place], [place] [address]    Special Requests   Final    BOTTLES DRAWN AEROBIC AND ANAEROBIC Blood Culture results may not be optimal due to an excessive volume of blood received in culture bottles Performed at [facility], [address] [place], [place] [address]    Culture  Setup Time   Final    GRAM POSITIVE COCCI AEROBIC BOTTLE ONLY CRITICAL RESULT CALLED TO, READ BACK BY AND VERIFIED WITH: JODY BAREFOOT AT [id number] 10/12/2019.PMF Performed at [facility], [address], [place], [place] [address]    Culture (A)  Final    STAPHYLOCOCCUS  SPECIES (COAGULASE NEGATIVE) THE SIGNIFICANCE OF ISOLATING THIS ORGANISM FROM A SINGLE SET OF BLOOD CULTURES WHEN MULTIPLE SETS ARE DRAWN IS UNCERTAIN. PLEASE NOTIFY THE MICROBIOLOGY DEPARTMENT WITHIN ONE WEEK IF SPECIATION AND SENSITIVITIES ARE REQUIRED. Performed at [facility], [place] [address] [place], [place] [address]    Report Status 10/13/2019 FINAL  Final  Culture, respiratory (tracheal aspirate)     Status: None   Collection Time: 10/11/19 10:57 AM   Specimen: Tracheal Aspirate; Respiratory  Result Value Ref Range Status   Specimen Description   Final    TRACHEAL ASPIRATE Performed at [facility], [address] [place], [place] [address]    Special Requests   Final    NONE Performed at [facility], [place], [place] [address]    Gram Stain NO WBC SEEN RARE GRAM POSITIVE COCCI IN PAIRS   Final   Culture   Final    Consistent with normal respiratory flora. Performed at [facility], [place] [address] [place], [place] [address]    Report Status 10/13/2019 FINAL  Final    Coagulation Studies: No results for input(s): LABPROT, INR in the last 72 hours.  Urinalysis: No results for input(s): COLORURINE, LABSPEC, PHURINE, GLUCOSEU, HGBUR, BILIRUBINUR, KETONESUR, PROTEINUR, UROBILINOGEN, NITRITE, LEUKOCYTESUR in the last 72 hours.  Invalid input(s): APPERANCEUR    Imaging: No results found.   Medications:   . sodium chloride 10 mL/hr at 10/11/19 1300  . dextrose 20 mL/hr at 10/13/19 1405   . amLODipine  10 mg Oral Daily  . calcium acetate  1,334 mg Oral TID WC  . carvedilol  12.5 mg Oral BID WC  . Chlorhexidine Gluconate Cloth  6 each Topical Q0600  . clopidogrel  75 mg Oral Daily  . famotidine  20 mg Oral Daily  . heparin  5,000 Units Subcutaneous Q8H  . hydrALAZINE  50 mg Oral Q8H  . lidocaine  1 patch Transdermal Q24H  . losartan  50 mg Oral Daily  . pravastatin  40 mg Oral q1800  . torsemide  40 mg Oral Once per day on Sun Mon Wed Fri   sodium chloride, acetaminophen **OR** acetaminophen, acetaminophen, benzonatate, diphenhydrAMINE, glycopyrrolate **OR** glycopyrrolate **OR** glycopyrrolate, HYDROmorphone (DILAUDID) injection, ipratropium-albuterol, ondansetron (ZOFRAN) IV, oxyCODONE-acetaminophen, polyvinyl alcohol  Assessment/ Plan:  Phyllis [name] is a 73 y.o. white female with end stage renal disease on hemodialysis, hypertension, diabetes mellitus type II, overactive bladder, gout, COPD, congestive heart failure, coronary artery disease, aortic atherosclerosis admitted to [facility] on 10/11/2019 for Acute pulmonary edema ([place]) [J81.0] Acute respiratory failure with hypoxia ([place]) [J96.01] Sepsis, due to unspecified organism, unspecified whether acute organ dysfunction present ([place]) [A41.9]  [facility] TTS 60kg  #. ESRD: with volume overload/acute exacerbation of congestive heart failure/aortic stenosis on admission.  Emergent dialysis on admission Evaluate daily for dialysis need.  Ultrafiltration limited by aortic stenosis.  -Patient underwent hemodialysis yesterday.  Tolerated well.  Ultrafiltration has been limited by aortic stenosis.  She normally dialyzes on TTS schedule but hold off for now.  Recommend dialysis treatment tomorrow if she is transferred.  #. Anemia of CKD: . Status post PRBC blood transfusion on 11/13 Lab Results  Component Value Date   HGB 7.5 (L) 10/14/2019   -Hemoglobin down to 7.5.  Consider blood transfusion for hemoglobin of 7 or less.  #. SHPTH Lab Results  Component Value Date   CALCIUM 8.2 (L) 10/14/2019   PHOS 4.1 10/13/2019   -  Phosphorus currently well controlled at 4.1.  Maintain the patient on calcium acetate.  #.  Severe aortic stenosis.  Patient being considered for TAVR procedure at [facility].      LOS: 3 Triniti Gruetzmacher 11/24/20209:58 AM

## 2019-10-14 NOTE — Progress Notes (Signed)
Paged cath lab as felt a small hematoma forming in rt groin, cath lab RN came an held pressure. Will continue to monitor.

## 2019-10-14 NOTE — Progress Notes (Addendum)
Transferred to [facility] via [place].Patient headed to [facility] # 10. Report called to [employer] on [address] in [place]. 1000 am medications not given and patient has been NPO since midnight.

## 2019-10-15 ENCOUNTER — Encounter (HOSPITAL_COMMUNITY): Payer: Self-pay | Admitting: Interventional Cardiology

## 2019-10-15 DIAGNOSIS — I25110 Atherosclerotic heart disease of native coronary artery with unstable angina pectoris: Secondary | ICD-10-CM

## 2019-10-15 LAB — CBC WITH DIFFERENTIAL/PLATELET
Abs Immature Granulocytes: 0.03 10*3/uL (ref 0.00–0.07)
Basophils Absolute: 0 10*3/uL (ref 0.0–0.1)
Basophils Relative: 0 %
Eosinophils Absolute: 0.2 10*3/uL (ref 0.0–0.5)
Eosinophils Relative: 3 %
HCT: 25 % — ABNORMAL LOW (ref 36.0–46.0)
Hemoglobin: 7.9 g/dL — ABNORMAL LOW (ref 12.0–15.0)
Immature Granulocytes: 0 %
Lymphocytes Relative: 11 %
Lymphs Abs: 0.8 10*3/uL (ref 0.7–4.0)
MCH: 30.2 pg (ref 26.0–34.0)
MCHC: 31.6 g/dL (ref 30.0–36.0)
MCV: 95.4 fL (ref 80.0–100.0)
Monocytes Absolute: 0.6 10*3/uL (ref 0.1–1.0)
Monocytes Relative: 8 %
Neutro Abs: 5.4 10*3/uL (ref 1.7–7.7)
Neutrophils Relative %: 78 %
Platelets: 137 10*3/uL — ABNORMAL LOW (ref 150–400)
RBC: 2.62 MIL/uL — ABNORMAL LOW (ref 3.87–5.11)
RDW: 14.7 % (ref 11.5–15.5)
WBC: 7 10*3/uL (ref 4.0–10.5)
nRBC: 0 % (ref 0.0–0.2)

## 2019-10-15 LAB — MAGNESIUM: Magnesium: 2.4 mg/dL (ref 1.7–2.4)

## 2019-10-15 LAB — GLUCOSE, CAPILLARY
Glucose-Capillary: 111 mg/dL — ABNORMAL HIGH (ref 70–99)
Glucose-Capillary: 108 mg/dL — ABNORMAL HIGH (ref 70–99)
Glucose-Capillary: 138 mg/dL — ABNORMAL HIGH (ref 70–99)
Glucose-Capillary: 134 mg/dL — ABNORMAL HIGH (ref 70–99)

## 2019-10-15 LAB — HEPARIN LEVEL (UNFRACTIONATED): Heparin Unfractionated: 0.16 IU/mL — ABNORMAL LOW (ref 0.30–0.70)

## 2019-10-15 LAB — COMPREHENSIVE METABOLIC PANEL WITH GFR
ALT: 11 U/L (ref 0–44)
AST: 13 U/L — ABNORMAL LOW (ref 15–41)
Albumin: 2.8 g/dL — ABNORMAL LOW (ref 3.5–5.0)
Alkaline Phosphatase: 46 U/L (ref 38–126)
Anion gap: 10 (ref 5–15)
BUN: 25 mg/dL — ABNORMAL HIGH (ref 8–23)
CO2: 30 mmol/L (ref 22–32)
Calcium: 8.3 mg/dL — ABNORMAL LOW (ref 8.9–10.3)
Chloride: 96 mmol/L — ABNORMAL LOW (ref 98–111)
Creatinine, Ser: 4.94 mg/dL — ABNORMAL HIGH (ref 0.44–1.00)
GFR calc Af Amer: 9 mL/min — ABNORMAL LOW
GFR calc non Af Amer: 8 mL/min — ABNORMAL LOW
Glucose, Bld: 137 mg/dL — ABNORMAL HIGH (ref 70–99)
Potassium: 4.7 mmol/L (ref 3.5–5.1)
Sodium: 136 mmol/L (ref 135–145)
Total Bilirubin: 0.6 mg/dL (ref 0.3–1.2)
Total Protein: 4.7 g/dL — ABNORMAL LOW (ref 6.5–8.1)

## 2019-10-15 LAB — PROTIME-INR
INR: 1.1 (ref 0.8–1.2)
Prothrombin Time: 14.1 s (ref 11.4–15.2)

## 2019-10-15 MED ORDER — EPOETIN ALFA NICU SYRINGE 2000 UNITS/ML: 2400.0000 [IU]/kg | INTRAMUSCULAR | Status: DC

## 2019-10-15 MED ORDER — SODIUM CHLORIDE 0.9 % IV SOLN: 62.5000 mg | INTRAVENOUS | Status: DC

## 2019-10-15 MED ORDER — DOXERCALCIFEROL 4 MCG/2ML IV SOLN
2.5000 ug | INTRAVENOUS | Status: DC
  Administered 2019-10-16: 12:00:00 2.5 ug via INTRAVENOUS
  Filled 2019-10-15: qty 2

## 2019-10-15 MED ORDER — DARBEPOETIN ALFA 25 MCG/0.42ML IJ SOSY: 25.0000 ug | PREFILLED_SYRINGE | INTRAMUSCULAR | Status: DC

## 2019-10-15 NOTE — Progress Notes (Addendum)
Progress Note  [name] Date of Encounter: 10/15/2019  Primary Cardiologist: Ida Rogue, MD   Subjective   Chest wall pain this morning, but otherwise comfortable.   Inpatient Medications    Scheduled Meds: . amLODipine  10 mg Oral Daily  . calcium acetate  1,334 mg Oral TID WC  . carvedilol  12.5 mg Oral BID WC  . Chlorhexidine Gluconate Cloth  6 each Topical Q0600  . clopidogrel  75 mg Oral Daily  . famotidine  20 mg Oral Daily  . hydrALAZINE  50 mg Oral Q8H  . lidocaine  1 patch Transdermal Q24H  . losartan  50 mg Oral Daily  . pravastatin  40 mg Oral q1800  . sodium chloride flush  3 mL Intravenous Q12H  . torsemide  40 mg Oral Once per day on Sun Mon Wed Fri   Continuous Infusions: . sodium chloride    . dextrose 20 mL/hr at 10/14/19 2035  . heparin 650 Units/hr (10/15/19 0134)   PRN Meds: sodium chloride, acetaminophen, benzonatate, HYDROmorphone (DILAUDID) injection, ipratropium-albuterol, ondansetron (ZOFRAN) IV, oxyCODONE-acetaminophen, sodium chloride flush   Vital Signs    Vitals:   10/15/19 0008 10/15/19 0353 10/15/19 0400 10/15/19 0635  BP: (!) 132/46  (!) 121/43   Pulse: 78  71   Resp: (!) 22  14   Temp: 98.2 F (36.8 C) 98.2 F (36.8 C)    TempSrc: Oral Axillary    SpO2: 96%  92%   Weight:    62.2 kg  Height:        Intake/Output Summary (Last 24 hours) at 10/15/2019 0847 Last data filed at 10/15/2019 0500 Gross per 24 hour  Intake 190.58 ml  Output -  Net 190.58 ml   Last 3 Weights 10/15/2019 10/14/2019 10/14/2019  Weight (lbs) 137 lb 2 oz 125 lb 10.6 oz 134 lb 11.2 oz  Weight (kg) 62.2 kg 57 kg 61.1 kg      Telemetry    SR - Personally Reviewed  ECG    No new tracing this morning.  Physical Exam  Pleasant older WF GEN: No acute distress.   Neck: No JVD Cardiac: RRR, 3/6 systolic murmur, rubs, or gallops.  Respiratory: Clear to auscultation bilaterally. GI: Soft, nontender, non-distended   MS: No edema; No deformity. Right femoral cath site stable.  Neuro:  Nonfocal  Psych: Normal affect   Labs    High Sensitivity Troponin:   Recent Labs  Lab 09/22/19 1238 09/22/19 1853 10/01/19 1650 10/01/19 1836 10/11/19 0626  TROPONINIHS 204* 185* 23* 26* 64*      Chemistry Recent Labs  Lab 10/11/19 0626  10/13/19 0640 10/14/19 0338 10/14/19 1451 10/14/19 1600 10/14/19 1601 10/15/19 0307  NA 134*   < > 136 140  --  135 136 136  K 5.0   < > 4.3 4.2  --  4.5 4.4 4.7  CL 96*   < > 97* 97*  --   --   --  96*  CO2 22   < > 26 34*  --   --   --  30  GLUCOSE 222*   < > 83 83  --   --   --  137*  BUN 28*   < > 34* 17  --   --   --  25*  CREATININE 5.56*   < > 5.04* 3.37* 4.27*  --   --  4.94*  CALCIUM 8.5*   < > 8.2* 8.2*  --   --   --  8.3*  PROT 6.0*  --   --   --   --   --   --  4.7*  ALBUMIN 3.6  --  3.1*  --   --   --   --  2.8*  AST 37  --   --   --   --   --   --  13*  ALT 17  --   --   --   --   --   --  11  ALKPHOS 63  --   --   --   --   --   --  46  BILITOT 0.8  --   --   --   --   --   --  0.6  GFRNONAA 7*   < > 8* 13* 10*  --   --  8*  GFRAA 8*   < > 9* 15* 11*  --   --  9*  ANIONGAP 16*   < > 13 9  --   --   --  10   < > = values in this interval not displayed.     Hematology Recent Labs  Lab 10/14/19 0338 10/14/19 1451 10/14/19 1600 10/14/19 1601 10/15/19 0307  WBC 4.4 5.6  --   --  7.0  RBC 2.57* 2.89*  --   --  2.62*  HGB 7.5* 8.8* 8.5* 8.5* 7.9*  HCT 24.3* 27.5* 25.0* 25.0* 25.0*  MCV 94.6 95.2  --   --  95.4  MCH 29.2 30.4  --   --  30.2  MCHC 30.9 32.0  --   --  31.6  RDW 14.9 14.7  --   --  14.7  PLT 107* 117*  --   --  137*    BNP Recent Labs  Lab 10/11/19 0626  BNP 2,087.0*     DDimer No results for input(s): DDIMER in the last 168 hours.   Radiology    No results found.  Cardiac Studies   Cath: 10/14/19   Severe, calcified ostial to distal left main, 80%.  Severe ostial LAD, 99% followed by an eccentric 50 to 70%  proximal LAD after the first diagonal.  Ostial to proximal 60% circumflex.  The second obtuse marginal contains segmental 60 to 70% narrowing.  The circumflex is dominant.  Nondominant RCA.  Moderately severe to severe aortic stenosis with valve area of 0.99 cm, peak to peak gradient 20 mmHg, mean gradient 28 mmHg, based upon cardiac output of 7.2 L/min and corresponding cardiac index of 4.83 L/min/m  Severe ascending and descending aortic calcification  RECOMMENDATIONS   Heart team approach: Determine if patient has surgical options with SAVR and CABG.  Consider extremely high risk PCI which would require hemodynamic support.  Consider palliative care given end-stage kidney disease on dialysis, severe anemia, and poor long-term prognosis even if stent therapy is successful.  Discussed with Dr. Burt Knack.  Diagnostic Dominance: Left  TTE: 09/21/19   IMPRESSIONS    1. Left ventricular ejection fraction, by visual estimation, is 60 to 65%. The left ventricle has normal function. There is mildly increased left ventricular hypertrophy.  2. Global right ventricle has normal systolic function.The right ventricular size is normal. No increase in right ventricular wall thickness.  3. Left atrial size was mild-moderately dilated.  4. Mild mitral valve regurgitation.  5. The tricuspid valve is normal in structure. Tricuspid valve regurgitation is not demonstrated.  6. The aortic valve was not well visualized. Measurements  as detailed below, Moderate aortic valve stenosis by gradients, severe stenosis by estimated AVA.  7. TR signal is inadequate for assessing pulmonary artery systolic pressure.  8. Aortic valve mean gradient measures 20.6 mmHg.  9. Aortic valve peak gradient measures 32.0 mmHg. 10. Aortic valve area, by VTI measures 0.68 cm.  Patient Profile     73 y.o. female with severe aortic valve stenosis, ESRD on HD TTS, recurrent flash pulmonary edema, recent respiratory/PEA  arrest, anemia of chronic disease, aortic atherosclerosis, anxiety, DM, HTN, HLD, obesity, asthma, COPD, OSA, and physical deconditioning who was admitted to [facility] on 11/21 with respiratory failure complicated by PEA arrest and subsequently transferred to [facility] for further evaluation.   Assessment & Plan    1. Severe Aortic Stenosis: transferred from [facility] to [facility] for possible TAVR work up. Peak to peak gradient of 20 mmHg and mean gradient 28 mmHg with calculated aortic valve area 0.99 cm. Underwent cardiac cath yesterday with Dr. Tamala Julian noted above with severe LM and ostial LAD disease. Not felt to be a candidate for CABG or high risk PCI at this time. Overall prognosis is poor. Heart team to review case. Will consult palliative care for [place] discussion as well.   2. CAD: severe LM and ostial LAD disease on cath yesterday. As above would be extremely high risk for PCI and not a candidate for CABG. Planned for medical therapy at this time.   3. Respiratory failure c/b PEA arrest: presented to [facility] with respiratory failure and then PEA arrest while at [facility] requiring brief CPR along with mechanical ventilation. Stable since arrival to [facility], maintaining on RA.   4. Anemia of chronic disease: Hgb stable around 8.0>>8.5. Will follow CBC  5. ESRD on HD: TTS schedule prior to admission. Will consult nephrology for HD needs while inpatient.   For questions or updates, please contact [place] Please consult [email or web address] for contact info under        Signed, Reino Bellis, NP  10/15/2019, 8:47 AM    I have personally seen and examined this patient with Reino Bellis, NP. I agree with the assessment and plan as outlined above. She has moderate to severe aortic stenosis with normal LV systolic function. She was found to have severe, heavily calcified left main and LAD disease yesterday. Her coronary anatomy is not favorable for PCI. 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 She  has no good options for revascularization. She will most likely continue to have issues with pulmonary edema due to myocardial ischemia.   I have had a long discussion with her today while her friend/caregiver was on the speaker phone. We discussed goals of care. We will not offer PCI, CABG or aortic valve replacement. We will ask our palliative care team to see her today to assist in outlining goals of care. She is a DNR. Consider Hospice/comfort care.  Will stop IV Heparin. Will continue Plavix, amlodipine, statin, beta blocker.  Will call Nephrology. She will need HD today.  I would anticipate d/c tomorrow.  Lauree Chandler 10/15/2019 9:52 AM

## 2019-10-15 NOTE — Consult Note (Addendum)
[facility] Renal Consultation Note  Indication for Consultation:  Management of ESRD/hemodialysis; anemia, hypertension/volume and secondary hyperparathyroidism  HPI: Phyllis [name] is a 73 y.o. femalewith ESRD HD At [facility] TTS., DM T2,congestive heart failure(multiple episodes of flash pulmonary Edema requiring  emergent HD  For fluid UF )  coronary artery disease,known moderate  Aortic Stenosis,Overactive Bladder, COPD.  She was admitted to [facility] 10/11/19 for Acute Pulmonary edema ( had emergent HD @ [facility])Noted at [facility], Initially, plans for palliative/comfort care given her comorbid conditions and overall poor prognosis.  However, after HD and upon extubation the patient was alert and oriented and wished to pursue further wu and transferred to [facility] for Cardiac evaluation .  Noted Cardiac Cath done 11/24 with Mod severe to severe aortic stenosis  And CAD  With Cardiology team in discussion  With patient with high risk procedure. Currently in room  No sob or chest pain . Noted she had no HD yest. we are consulted for HD / ESRD issues  And will plan for Hd today.       Past Medical History:  Diagnosis Date  . (HFpEF) heart failure with preserved ejection fraction ([place])    a. TTE 01/2014: nl LV sys fxn, no valvular abnormalities; b. TTE 11/16: nl EF, mild LVH;  c. 04/2019 Echo: EF 60-65%. DD. Nl RV fxn. Mod AS. 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  . Allergy   . Anemia of chronic disease   . Anxiety   . Aortic atherosclerosis ([place])   . Asthma   . Chronic back pain   . COPD (chronic obstructive pulmonary disease) ([place])   . Diabetes mellitus with complication ([place])   . ESRD on hemodialysis ([place])    a. Tues/Sat; b. 2/2 small kidneys  . Essential hypertension   . Fistula    lower left arm  . GERD (gastroesophageal reflux disease)   . Gout   . History of exercise stress test    a. 01/2014: no evidence of ischemia; b. Lexiscan 08/2015: no sig  ischemia, severe GI uptake artifact, low risk; c. CPET @ [facility] 09/2016: exercised 3 min 12 sec on bike without incline, 2.28 METs, VO2 of 8.1, 48% of predicted, indicating mod to sev functional impairment, evidence of blunted HR, stroke volume, and BP augmentation as well as ventilation-perfusion mismatch with exercise  . HLD (hyperlipidemia)   . Non-obstructive Carotid arterial disease ([place])    a. 12/2017: <50% bilat ICA dzs.  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 Severe aortic stenosis    a. by 09/2019 echo b. 04/2019 Echo: Mod AS.  Marland Kitchen Sleep apnea     Past Surgical History:  Procedure Laterality Date  . carpel tunnel    . GALLBLADDER SURGERY    . PERIPHERAL VASCULAR CATHETERIZATION N/A 04/12/2015   Procedure: A/V Shuntogram/Fistulagram;  Surgeon: Algernon Huxley, MD;  Location: [place] [facility];  Service: Cardiovascular;  Laterality: N/A;  . PERIPHERAL VASCULAR CATHETERIZATION N/A 04/12/2015   Procedure: A/V Shunt Intervention;  Surgeon: Algernon Huxley, MD;  Location: [place] [facility];  Service: Cardiovascular;  Laterality: N/A;  . PERIPHERAL VASCULAR CATHETERIZATION N/A 06/09/2015   Procedure: Dialysis/Perma Catheter Removal;  Surgeon: Katha Cabal, MD;  Location: [place] [facility];  Service: Cardiovascular;  Laterality: N/A;  . RIGHT/LEFT HEART CATH AND CORONARY ANGIOGRAPHY N/A 10/14/2019   Procedure: RIGHT/LEFT HEART CATH AND CORONARY ANGIOGRAPHY;  Surgeon: Belva Crome, MD;  Location: [place] [facility];  Service: Cardiovascular;  Laterality: N/A;      Family History  Problem Relation Age of Onset  . Hypertension Mother   . Hyperlipidemia Mother   . Heart disease Father   . Heart attack Father 23  . Hypertension Father   . Hyperlipidemia Father   . Heart disease Brother        CABG   . Heart attack Brother   . Breast cancer Neg Hx       reports that she has never smoked. She has never used smokeless tobacco. She reports that she does not  drink alcohol or use drugs.   Allergies  Allergen Reactions  . Enalapril Maleate Other (See Comments)    Other reaction(s): Headache  . Nitrofurantoin Swelling and Rash    Other Reaction: swelling of body  . Sulfamethoxazole-Trimethoprim Swelling  . 2,4-D Dimethylamine (Amisol) Rash and Other (See Comments)    Other Reaction: h/a  . Baclofen Other (See Comments) and Nausea Only    lightheadness ,drowsiness , muscle weakness , twitching in hands   . Neosporin [Neomycin-Bacitracin Zn-Polymyx] Other (See Comments) and Rash    Other Reaction: irritation Skin irritation   . Quinine Nausea And Vomiting, Rash and Other (See Comments)    Other Reaction: Vomiting, rash, h/a, vision  . Ultram [Tramadol] Palpitations  . Zocor [Simvastatin] Other (See Comments) and Rash    Other Reaction: muscle spasms Muscle pain and spasms  . Bactrim [Sulfamethoxazole-Trimethoprim] Swelling  . Levodopa Other (See Comments)    Reaction: unknown  . Macrodantin [Nitrofurantoin Macrocrystal] Swelling  . Quinine Derivatives Other (See Comments)    Vertigo,nausea vomiting blurred vision headache ears sensitivity     Prior to Admission medications   Medication Sig Start Date End Date Taking? Authorizing Provider  acetaminophen (TYLENOL) 500 MG tablet Take 1 tablet (500 mg total) by mouth every 6 (six) hours as needed for mild pain or fever. 09/29/19   Danford, Suann Larry, MD  albuterol (PROVENTIL HFA;VENTOLIN HFA) 108 (90 Base) MCG/ACT inhaler Inhale 2 puffs into the lungs every 6 (six) hours as needed for wheezing or shortness of breath.    [provider]  amLODipine (NORVASC) 10 MG tablet Take 10 mg by mouth daily.  12/29/13   [provider]  calcium acetate (PHOSLO) 667 MG capsule Take 1,334 mg by mouth 3 (three) times daily with meals.     [provider]  carvedilol (COREG) 12.5 MG tablet Take 12.5 mg by mouth 2 (two) times daily with a meal.  01/09/14   [provider]   cetirizine (ZYRTEC) 10 MG tablet Take 10 mg by mouth daily as needed for allergies.     [provider]  clopidogrel (PLAVIX) 75 MG tablet Take 1 tablet (75 mg total) by mouth daily. 01/18/18   Epifanio Lesches, MD  Fluticasone-Salmeterol (ADVAIR) 250-50 MCG/DOSE AEPB Inhale 1 puff into the lungs 2 (two) times daily as needed (for shortness of breath). 09/07/19   Henreitta Leber, MD  hydrALAZINE (APRESOLINE) 50 MG tablet Take 1 tablet (50 mg total) by mouth every 8 (eight) hours. 09/29/19   Danford, Suann Larry, MD  HYDROcodone-acetaminophen (NORCO) 7.5-325 MG tablet Take 1 tablet by mouth every 6 (six) hours as needed (pain). 10/08/19   Danford, Suann Larry, MD  lidocaine (LIDODERM) 5 % Place 1 patch onto the skin daily. Remove & Discard patch within 12 hours or as directed by MD 10/14/19   Flora Lipps, MD  lidocaine-prilocaine (  EMLA) cream Apply 1 application topically as needed (prior to treatment).     [provider]  losartan (COZAAR) 50 MG tablet Take 50 mg by mouth daily. 03/18/19   [provider]  omeprazole (PRILOSEC) 20 MG capsule Take 1 capsule (20 mg total) by mouth 2 (two) times daily before a meal. 09/07/19   Sainani, Belia Heman, MD  ondansetron (ZOFRAN) 4 MG tablet Take 4 mg by mouth every 8 (eight) hours as needed. 05/20/19   [provider]  oxybutynin (DITROPAN XL) 15 MG 24 hr tablet Take 15 mg by mouth daily. 12/24/17   [provider]  pravastatin (PRAVACHOL) 40 MG tablet Take 40 mg by mouth at bedtime.     [provider]  scopolamine (TRANSDERM-SCOP) 1 MG/3DAYS Place 1 patch onto the skin every 3 (three) days.    [provider]  topiramate (TOPAMAX) 25 MG tablet Take 1 tablet (25 mg total) by mouth as needed (for headaches). 09/07/19   Henreitta Leber, MD  torsemide (DEMADEX) 20 MG tablet Take 2 tablets (40 mg total) by mouth every Monday,Wednesday,Friday, and Sunday at 6 PM. 10/01/19   Danford, Suann Larry, MD      Anti-infectives (From admission, onward)   None      Results for orders placed or performed during the hospital encounter of 10/14/19 (from the past 48 hour(s))  Glucose, capillary     Status: None   Collection Time: 10/14/19  1:22 PM  Result Value Ref Range   Glucose-Capillary 83 70 - 99 mg/dL  CBC     Status: Abnormal   Collection Time: 10/14/19  2:51 PM  Result Value Ref Range   WBC 5.6 4.0 - 10.5 K/uL   RBC 2.89 (L) 3.87 - 5.11 MIL/uL   Hemoglobin 8.8 (L) 12.0 - 15.0 g/dL   HCT 27.5 (L) 36.0 - 46.0 %   MCV 95.2 80.0 - 100.0 fL   MCH 30.4 26.0 - 34.0 pg   MCHC 32.0 30.0 - 36.0 g/dL   RDW 14.7 11.5 - 15.5 %   Platelets 117 (L) 150 - 400 K/uL    Comment: REPEATED TO VERIFY PLATELET COUNT CONFIRMED BY SMEAR Immature Platelet Fraction may be clinically indicated, consider ordering this additional test [id number]    nRBC 0.0 0.0 - 0.2 %    Comment: Performed at [facility], [place] [address], [place], [place] [address]  Creatinine, serum     Status: Abnormal   Collection Time: 10/14/19  2:51 PM  Result Value Ref Range   Creatinine, Ser 4.27 (H) 0.44 - 1.00 mg/dL   GFR calc non Af Amer 10 (L) >60 mL/min   GFR calc Af Amer 11 (L) >60 mL/min    Comment: Performed at [facility] [address] [place], [place] [address]  POCT I-Stat EG7     Status: Abnormal   Collection Time: 10/14/19  4:00 PM  Result Value Ref Range   pH, Ven 7.420 7.250 - 7.430   pCO2, Ven 51.9 44.0 - 60.0 mmHg   pO2, Ven 41.0 32.0 - 45.0 mmHg   Bicarbonate 33.7 (H) 20.0 - 28.0 mmol/L   TCO2 35 (H) 22 - 32 mmol/L   O2 Saturation 77.0 %   Acid-Base Excess 8.0 (H) 0.0 - 2.0 mmol/L   Sodium 135 135 - 145 mmol/L   Potassium 4.5 3.5 - 5.1 mmol/L   Calcium, Ion 1.12 (L) 1.15 - 1.40 mmol/L   HCT 25.0 (L) 36.0 -  46.0 %   Hemoglobin 8.5 (L) 12.0 - 15.0 g/dL   Patient temperature HIDE    Sample type MIXED VENOUS SAMPLE   I-STAT 7, (LYTES, BLD GAS, ICA, H+H)     Status: Abnormal   Collection  Time: 10/14/19  4:01 PM  Result Value Ref Range   pH, Arterial 7.432 7.350 - 7.450   pCO2 arterial 49.1 (H) 32.0 - 48.0 mmHg   pO2, Arterial 191.0 (H) 83.0 - 108.0 mmHg   Bicarbonate 32.7 (H) 20.0 - 28.0 mmol/L   TCO2 34 (H) 22 - 32 mmol/L   O2 Saturation 100.0 %   Acid-Base Excess 8.0 (H) 0.0 - 2.0 mmol/L   Sodium 136 135 - 145 mmol/L   Potassium 4.4 3.5 - 5.1 mmol/L   Calcium, Ion 1.10 (L) 1.15 - 1.40 mmol/L   HCT 25.0 (L) 36.0 - 46.0 %   Hemoglobin 8.5 (L) 12.0 - 15.0 g/dL   Patient temperature HIDE    Sample type ARTERIAL   Glucose, capillary     Status: None   Collection Time: 10/14/19  4:52 PM  Result Value Ref Range   Glucose-Capillary 86 70 - 99 mg/dL  Glucose, capillary     Status: None   Collection Time: 10/14/19  8:39 PM  Result Value Ref Range   Glucose-Capillary 89 70 - 99 mg/dL  CBC WITH DIFFERENTIAL     Status: Abnormal   Collection Time: 10/15/19  3:07 AM  Result Value Ref Range   WBC 7.0 4.0 - 10.5 K/uL   RBC 2.62 (L) 3.87 - 5.11 MIL/uL   Hemoglobin 7.9 (L) 12.0 - 15.0 g/dL   HCT 25.0 (L) 36.0 - 46.0 %   MCV 95.4 80.0 - 100.0 fL   MCH 30.2 26.0 - 34.0 pg   MCHC 31.6 30.0 - 36.0 g/dL   RDW 14.7 11.5 - 15.5 %   Platelets 137 (L) 150 - 400 K/uL   nRBC 0.0 0.0 - 0.2 %   Neutrophils Relative % 78 %   Neutro Abs 5.4 1.7 - 7.7 K/uL   Lymphocytes Relative 11 %   Lymphs Abs 0.8 0.7 - 4.0 K/uL   Monocytes Relative 8 %   Monocytes Absolute 0.6 0.1 - 1.0 K/uL   Eosinophils Relative 3 %   Eosinophils Absolute 0.2 0.0 - 0.5 K/uL   Basophils Relative 0 %   Basophils Absolute 0.0 0.0 - 0.1 K/uL   Immature Granulocytes 0 %   Abs Immature Granulocytes 0.03 0.00 - 0.07 K/uL    Comment: Performed at [facility], [address], [place], [place] [address]  Comprehensive metabolic panel     Status: Abnormal   Collection Time: 10/15/19  3:07 AM  Result Value Ref Range   Sodium 136 135 - 145 mmol/L   Potassium 4.7 3.5 - 5.1 mmol/L   Chloride 96 (L) 98 - 111  mmol/L   CO2 30 22 - 32 mmol/L   Glucose, Bld 137 (H) 70 - 99 mg/dL   BUN 25 (H) 8 - 23 mg/dL   Creatinine, Ser 4.94 (H) 0.44 - 1.00 mg/dL   Calcium 8.3 (L) 8.9 - 10.3 mg/dL   Total Protein 4.7 (L) 6.5 - 8.1 g/dL   Albumin 2.8 (L) 3.5 - 5.0 g/dL   AST 13 (L) 15 - 41 U/L   ALT 11 0 - 44 U/L   Alkaline Phosphatase 46 38 - 126 U/L   Total Bilirubin 0.6 0.3 - 1.2 mg/dL   GFR calc  non Af Amer 8 (L) >60 mL/min   GFR calc Af Amer 9 (L) >60 mL/min   Anion gap 10 5 - 15    Comment: Performed at [facility] [address], [place], [place] [address]  Protime-INR     Status: None   Collection Time: 10/15/19  3:07 AM  Result Value Ref Range   Prothrombin Time 14.1 11.4 - 15.2 seconds   INR 1.1 0.8 - 1.2    Comment: (NOTE) INR goal varies based on device and disease states. Performed at [facility], [place] [address] [place], [place] [address]   Magnesium     Status: None   Collection Time: 10/15/19  3:07 AM  Result Value Ref Range   Magnesium 2.4 1.7 - 2.4 mg/dL    Comment: Performed at [facility] [address] [place], [place] [address]  Glucose, capillary     Status: Abnormal   Collection Time: 10/15/19  6:17 AM  Result Value Ref Range   Glucose-Capillary 111 (H) 70 - 99 mg/dL   Comment 1 Notify RN    Comment 2 Document in Chart   Heparin level (unfractionated)     Status: Abnormal   Collection Time: 10/15/19  9:30 AM  Result Value Ref Range   Heparin Unfractionated 0.16 (L) 0.30 - 0.70 IU/mL    Comment: (NOTE) If heparin results are below expected values, and patient dosage has  been confirmed, suggest follow up testing of antithrombin III levels. Performed at [facility], [place] [address] [place], [place] [address]   Glucose, capillary     Status: Abnormal   Collection Time: 10/15/19 11:18 AM  Result Value Ref Range   Glucose-Capillary 108 (H) 70 - 99 mg/dL   Comment 1 Notify RN    Comment 2 Document in Chart     ROS: see hpi  Physical  Exam: Vitals:   10/15/19 1000 10/15/19 1052  BP: (!) 164/43 (!) 161/54  Pulse: 80 75  Resp: 15 15  Temp:  98.3 F (36.8 C)  SpO2: 96% 100%     General: alert elderly female nad , wd, wn, appropriate  HEENT:  , not icteric, eomi, MMM Neck: no JVD Heart: RRR, 3/6 Systolic Mur.  No rub or gallops  Lungs: CTA , non labored breathing Abdomen: Bs pos, soft, NT,ND Extremities: No pedal edema Skin: no overt rash or pedal ulcers, warm dry Neuro: Alert O X3,moves all extremities, no acute focal deficits appreciated  Dialysis Access: Pos bruit L FA AVF  Dialysis Orders: Center: [address]  on TTS . EDW 58kg (NO RINSE BACK) HD Bath 2 k, 2ca    Time 165 min  Heparin= none. Access LFA AVF BFR 350 DFR 600     Hec 2.5  mcg IV/HD  Epogen 2400   Units IV/HD  Venofer  50mg  q hd weekly    Assessment/Plan  1. ESRD ( Normal TTS schedule ) [facility] hd [facility] 11/23 plan for hd today/ if no UOP  Dc Torsemide in ESRD  Pt.  2. Severe Aortic stenosis - plans per Cardiology team  Noted Palliative care team  to see  3. CAD  (cath 11/24 severe LM and ostial LAD disease  Cardiology managing "extremely high risk for PCI and not a candidate for CABG. Planned for medical therapy at this time. " 4. Hypertension/volume  - Currently vol appears stable no  vol on hd  uf today /  on hydralazine 50 mg q 8hrs and coreg 12.12mb  g bid, amlodipine 10mg  qd , losartan 50mg   Qd  Fu bps with hd  5. Anemia  - hgb 7.9  Give esa on hd  Weekly iron  On hd  6. Metabolic bone disease -  Ca / phos stable on Hectorol  on hd  And Calcium acetate binder   Ernest Haber, PA-C [place] [phone] 10/15/2019, 11:26 AM   Pt seen, examined and agree w A/P as above.  Kelly Splinter  MD 10/15/2019, 3:11 PM

## 2019-10-15 NOTE — Plan of Care (Signed)
  Problem: Education: Goal: Knowledge of General Education information will improve Description: Including pain rating scale, medication(s)/side effects and non-pharmacologic comfort measures Outcome: Progressing   Problem: Health Behavior/Discharge Planning: Goal: Ability to manage health-related needs will improve Outcome: Progressing   Problem: Clinical Measurements: Goal: Ability to maintain clinical measurements within normal limits will improve Outcome: Progressing Goal: Will remain free from infection Outcome: Progressing Goal: Diagnostic test results will improve Outcome: Progressing Goal: Respiratory complications will improve Outcome: Progressing Goal: Cardiovascular complication will be avoided Outcome: Progressing   Problem: Activity: Goal: Risk for activity intolerance will decrease Outcome: Progressing   Problem: Nutrition: Goal: Adequate nutrition will be maintained Outcome: Progressing   Problem: Coping: Goal: Level of anxiety will decrease Outcome: Progressing   Problem: Elimination: Goal: Will not experience complications related to bowel motility Outcome: Progressing Goal: Will not experience complications related to urinary retention Outcome: Progressing   Problem: Pain Managment: Goal: General experience of comfort will improve Outcome: Progressing   Problem: Safety: Goal: Ability to remain free from injury will improve Outcome: Progressing   Problem: Skin Integrity: Goal: Risk for impaired skin integrity will decrease Outcome: Progressing   Problem: Activity: Goal: Ability to return to baseline activity level will improve Outcome: Progressing   Problem: Cardiovascular: Goal: Ability to achieve and maintain adequate cardiovascular perfusion will improve Outcome: Progressing Goal: Vascular access site(s) Level 0-1 will be maintained Outcome: Progressing   Problem: Health Behavior/Discharge Planning: Goal: Ability to safely manage  health-related needs after discharge will improve Outcome: Progressing   

## 2019-10-15 NOTE — Progress Notes (Signed)
Informed floor Rn Pecolia Ades that HD Tx is rescheduled for 10/16/2019 per Dr. Jonnie Finner.

## 2019-10-15 NOTE — Plan of Care (Signed)

## 2019-10-16 DIAGNOSIS — Z515 Encounter for palliative care: Secondary | ICD-10-CM

## 2019-10-16 DIAGNOSIS — Z7189 Other specified counseling: Secondary | ICD-10-CM

## 2019-10-16 DIAGNOSIS — I700 Atherosclerosis of aorta: Secondary | ICD-10-CM

## 2019-10-16 LAB — CULTURE, BLOOD (ROUTINE X 2): Culture: NO GROWTH

## 2019-10-16 LAB — BASIC METABOLIC PANEL
Anion gap: 12 (ref 5–15)
BUN: 33 mg/dL — ABNORMAL HIGH (ref 8–23)
CO2: 30 mmol/L (ref 22–32)
Calcium: 8.8 mg/dL — ABNORMAL LOW (ref 8.9–10.3)
Chloride: 91 mmol/L — ABNORMAL LOW (ref 98–111)
Creatinine, Ser: 6.02 mg/dL — ABNORMAL HIGH (ref 0.44–1.00)
GFR calc Af Amer: 7 mL/min — ABNORMAL LOW (ref >60)
GFR calc non Af Amer: 6 mL/min — ABNORMAL LOW (ref >60)
Glucose, Bld: 87 mg/dL (ref 70–99)
Potassium: 5.1 mmol/L (ref 3.5–5.1)
Sodium: 133 mmol/L — ABNORMAL LOW (ref 135–145)

## 2019-10-16 LAB — GLUCOSE, CAPILLARY
Glucose-Capillary: 92 mg/dL (ref 70–99)
Glucose-Capillary: 104 mg/dL — ABNORMAL HIGH (ref 70–99)
Glucose-Capillary: 132 mg/dL — ABNORMAL HIGH (ref 70–99)
Glucose-Capillary: 131 mg/dL — ABNORMAL HIGH (ref 70–99)

## 2019-10-16 LAB — HEPATITIS B SURFACE ANTIBODY,QUALITATIVE: Hep B S Ab: REACTIVE — AB

## 2019-10-16 LAB — HEPATITIS B CORE ANTIBODY, TOTAL: Hep B Core Total Ab: NONREACTIVE

## 2019-10-16 LAB — HEPATITIS B SURFACE ANTIGEN: Hepatitis B Surface Ag: NONREACTIVE

## 2019-10-16 MED ORDER — DOXERCALCIFEROL 4 MCG/2ML IV SOLN
INTRAVENOUS | Status: AC
  Administered 2019-10-16: 2.5 ug via INTRAVENOUS
  Filled 2019-10-16: qty 2

## 2019-10-16 MED ORDER — DARBEPOETIN ALFA 25 MCG/0.42ML IJ SOSY
PREFILLED_SYRINGE | INTRAMUSCULAR | Status: AC
  Administered 2019-10-16: 25 ug via INTRAVENOUS
  Filled 2019-10-16: qty 0.42

## 2019-10-16 MED ORDER — HEPARIN SODIUM (PORCINE) 5000 UNIT/ML IJ SOLN
5000.0000 [IU] | Freq: Three times a day (TID) | INTRAMUSCULAR | Status: DC
  Administered 2019-10-16: 5000 [IU] via SUBCUTANEOUS
  Filled 2019-10-16: qty 1

## 2019-10-16 MED ORDER — DARBEPOETIN ALFA 25 MCG/0.42ML IJ SOSY
25.0000 ug | PREFILLED_SYRINGE | INTRAMUSCULAR | Status: AC
  Administered 2019-10-16: 12:00:00 25 ug via INTRAVENOUS
  Filled 2019-10-16: qty 0.42

## 2019-10-16 NOTE — Consult Note (Signed)
Consultation Note Date: 10/16/19  [name]  DOB: [birth year]  MRN: [id number]  Age / Sex: 73 y.o., female  PCP: [name], MD Referring Physician: Sherren Mocha, MD  Reason for Consultation: Establishing goals of care  HPI/Patient Profile: 73 y.o. female  with past medical history of ESRD on hemodialysis, severe aortic stenosis, recurrent flash pulmonary edema, recent respiratory/PEA arrest, anemia of chronic disease, DM, HTN, HLD, obesity, asthma, COPD, OSA, transferred from [facility] on 10/14/2019 for possible TAVR workup. Cardiac cath on 11/24 revealing severe CAD. Not felt to be a candidate for CABG and high risk PCI/TAVR. Continue medical management. Palliative medicine consultation for goals of care.    Clinical Assessment and Goals of Care:  I have reviewed medical records, discussed with care team, and met with patient at bedside to discuss diagnosis, prognosis, GOC, EOL wishes, disposition and options.  Introduced Palliative Medicine as specialized medical care for people living with serious illness. It focuses on providing relief from the symptoms and stress of a serious illness. The goal is to improve quality of life for both the patient and the family.  Patient seen by PMT provider at [facility] last week. Note reviewed.   Discussed course of hospitalization including diagnoses, interventions, and plan of care. Patient understands that she is unfortunately not a candidate for aggressive cardiac interventions. Discussed medical management and poor prognosis. Patient states that a cardiologist explained that she could just have 2-4 months to live. Patient is saddened by this but seems to be accepting the fact that time is short. Explained high risk for recurrent admission due to symptoms from severe aortic stenosis.   I attempted to elicit values and goals of care important to the  patient. Phyllis [name] wishes to continue hemodialysis. Prior to recent health decline in the last few weeks, she was living independently and driving herself to dialysis. [name] speaks of the plan to return to SNF for rehab with hopes to become strong enough to return home. She acknowledges that she will need caregiver support if able to return home, but has financial means to do so.    Discussed outpatient palliative versus hospice options. Explained to Phyllis [name] that she currently is eligible for hospice services (with likely [place] less prognosis) but that with desire to continue hemodialysis, a hospice agency will not enroll her in hospice services. Also plan to return back to SNF for rehab attempts. Patient is agreeable to outpatient palliative referral.   Advanced directives and concepts specific to code status were discussed. Patient completed AD/HCPOA paperwork on 11/16 and confirms her decision for friends ([name] to serve as her POA's. [name] decision for DNR code status. Introduced MOST form and encouraged patient to review and consider completing with a provider.   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 Emotional/spiritual support provided.   Questions and concerns were addressed.   SUMMARY OF RECOMMENDATIONS    DNR, otherwise continue current plan of care and medical  management.   Patient acknowledges she is not a candidate for aggressive cardiac interventions. She understands poor prognosis.   Patient wishes to continue hemodialysis at this time.   Plan for SNF for further rehab attempts. Ultimately, patient is hopeful to eventually return home with caregiver support.   Completed AD packet scanned into EMR. Friends [name] and [name]) are documented HCPOA's.   Outpatient palliative referral at SNF. Unfortunately patient is not eligible for hospice services with desire to continue hemodialysis.   Code Status/Advance  Care Planning:  DNR  Symptom Management:   Continue prn percocet q6h prn pain  Continue Lidoderm patch  Palliative Prophylaxis:   Bowel Regimen and Frequent Pain Assessment    Psycho-social/Spiritual:   Desire for further Chaplaincy support: yes  Additional Recommendations: Caregiving  Support/Resources, Compassionate Wean Education and Education on Hospice  Prognosis:   Poor long-term prognosis  Discharge Planning: Phyllis [name] for rehab with Palliative care service follow-up      Primary Diagnoses: Present on Admission: . HTN (hypertension) . Type 2 diabetes mellitus with other specified complication ([place]) . Hyperlipidemia . Aortic atherosclerosis ([place]) . Respiratory failure with hypoxia ([place]) . Acute on chronic diastolic CHF (congestive heart failure) ([place]) . Flash pulmonary edema (H[facility])   I have reviewed the medical record, interviewed the patient and family, and examined the patient. The following aspects are pertinent.  Past Medical History:  Diagnosis Date  . (HFpEF) heart failure with preserved ejection fraction ([place])    a. TTE 01/2014: nl LV sys fxn, no valvular abnormalities; b. TTE 11/16: nl EF, mild LVH;  c. 04/2019 Echo: EF 60-65%. DD. Nl RV fxn. Mod AS. 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  . Allergy   . Anemia of chronic disease   . Anxiety   . Aortic atherosclerosis ([place])   . Asthma   . Chronic back pain   . COPD (chronic obstructive pulmonary disease) ([place])   . Diabetes mellitus with complication ([place])   . ESRD on hemodialysis ([place])    a. Tues/Sat; b. 2/2 small kidneys  . Essential hypertension   . Fistula    lower left arm  . GERD (gastroesophageal reflux disease)   . Gout   . History of exercise stress test    a. 01/2014: no evidence of ischemia; b. Lexiscan 08/2015: no sig ischemia, severe GI uptake artifact, low risk; c. CPET @ [facility] 09/2016: exercised 3 min 12 sec on bike without incline, 2.28 METs, VO2 of 8.1,  48% of predicted, indicating mod to sev functional impairment, evidence of blunted HR, stroke volume, and BP augmentation as well as ventilation-perfusion mismatch with exercise  . HLD (hyperlipidemia)   . Non-obstructive Carotid arterial disease ([place])    a. 12/2017: <50% bilat ICA dzs.  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 Severe aortic stenosis    a. by 09/2019 echo b. 04/2019 Echo: Mod AS.  Marland Kitchen Sleep apnea    Social History   Socioeconomic History  . Marital status: Single    Spouse name: Not on file  . Number of children: Not on file  . Years of education: Not on file  . Highest education level: Not on file  Occupational History  . Not on file  Social Needs  . Financial resource strain: Not on file  . Food insecurity    Worry: Not on file    Inability: Not on file  . Transportation needs    Medical: Not  on file    Non-medical: Not on file  Tobacco Use  . Smoking status: Never Smoker  . Smokeless tobacco: Never Used  Substance and Sexual Activity  . Alcohol use: No  . Drug use: No  . Sexual activity: Not Currently  Lifestyle  . Physical activity    Days per week: Not on file    Minutes per session: Not on file  . Stress: Not on file  Relationships  . Social Herbalist on phone: Not on file    Gets together: Not on file    Attends religious service: Not on file    Active member of club or organization: Not on file    Attends meetings of clubs or organizations: Not on file    Relationship status: Not on file  Other Topics Concern  . Not on file  Social History Narrative  . Not on file   Family History  Problem Relation Age of Onset  . Hypertension Mother   . Hyperlipidemia Mother   . Heart disease Father   . Heart attack Father 76  . Hypertension Father   . Hyperlipidemia Father   . Heart disease Brother        CABG   . Heart attack Brother   . Breast cancer Neg Hx    Scheduled Meds: . amLODipine  10 mg Oral Daily  .  calcium acetate  1,334 mg Oral TID WC  . carvedilol  12.5 mg Oral BID WC  . Chlorhexidine Gluconate Cloth  6 each Topical Q0600  . clopidogrel  75 mg Oral Daily  . doxercalciferol  2.5 mcg Intravenous Q T,Th,Sa-HD  . famotidine  20 mg Oral Daily  . heparin  5,000 Units Subcutaneous Q8H  . hydrALAZINE  50 mg Oral Q8H  . lidocaine  1 patch Transdermal Q24H  . losartan  50 mg Oral Daily  . pravastatin  40 mg Oral q1800  . sodium chloride flush  3 mL Intravenous Q12H   Continuous Infusions: . sodium chloride    . dextrose 20 mL/hr at 10/16/19 0800  . [START ON 10/21/2019] ferric gluconate (FERRLECIT/NULECIT) IV     PRN Meds:.sodium chloride, acetaminophen, benzonatate, HYDROmorphone (DILAUDID) injection, ipratropium-albuterol, ondansetron (ZOFRAN) IV, oxyCODONE-acetaminophen, sodium chloride flush Medications Prior to Admission:  Prior to Admission medications   Medication Sig Start Date End Date Taking? Authorizing Provider  acetaminophen (TYLENOL) 500 MG tablet Take 1 tablet (500 mg total) by mouth every 6 (six) hours as needed for mild pain or fever. 09/29/19   Danford, Suann Larry, MD  albuterol (PROVENTIL HFA;VENTOLIN HFA) 108 (90 Base) MCG/ACT inhaler Inhale 2 puffs into the lungs every 6 (six) hours as needed for wheezing or shortness of breath.    [provider]  amLODipine (NORVASC) 10 MG tablet Take 10 mg by mouth daily.  12/29/13   [provider]  calcium acetate (PHOSLO) 667 MG capsule Take 1,334 mg by mouth 3 (three) times daily with meals.     [provider]  carvedilol (COREG) 12.5 MG tablet Take 12.5 mg by mouth 2 (two) times daily with a meal.  01/09/14   [provider]  cetirizine (ZYRTEC) 10 MG tablet Take 10 mg by mouth daily as needed for allergies.     [provider]  clopidogrel (PLAVIX) 75 MG tablet Take 1 tablet (75 mg total) by mouth daily. 01/18/18   Epifanio Lesches, MD  Fluticasone-Salmeterol (ADVAIR) 250-50  MCG/DOSE AEPB Inhale 1 puff into the lungs  2 (two) times daily as needed (for shortness of breath). 09/07/19   Henreitta Leber, MD  hydrALAZINE (APRESOLINE) 50 MG tablet Take 1 tablet (50 mg total) by mouth every 8 (eight) hours. 09/29/19   Danford, Suann Larry, MD  HYDROcodone-acetaminophen (NORCO) 7.5-325 MG tablet Take 1 tablet by mouth every 6 (six) hours as needed (pain). 10/08/19   Danford, Suann Larry, MD  lidocaine (LIDODERM) 5 % Place 1 patch onto the skin daily. Remove & Discard patch within 12 hours or as directed by MD 10/14/19   Flora Lipps, MD  lidocaine-prilocaine (EMLA) cream Apply 1 application topically as needed (prior to treatment).     [provider]  losartan (COZAAR) 50 MG tablet Take 50 mg by mouth daily. 03/18/19   [provider]  omeprazole (PRILOSEC) 20 MG capsule Take 1 capsule (20 mg total) by mouth 2 (two) times daily before a meal. 09/07/19   Sainani, Belia Heman, MD  ondansetron (ZOFRAN) 4 MG tablet Take 4 mg by mouth every 8 (eight) hours as needed. 05/20/19   [provider]  oxybutynin (DITROPAN XL) 15 MG 24 hr tablet Take 15 mg by mouth daily. 12/24/17   [provider]  pravastatin (PRAVACHOL) 40 MG tablet Take 40 mg by mouth at bedtime.     [provider]  scopolamine (TRANSDERM-SCOP) 1 MG/3DAYS Place 1 patch onto the skin every 3 (three) days.    [provider]  topiramate (TOPAMAX) 25 MG tablet Take 1 tablet (25 mg total) by mouth as needed (for headaches). 09/07/19   Henreitta Leber, MD  torsemide (DEMADEX) 20 MG tablet Take 2 tablets (40 mg total) by mouth every Monday,Wednesday,Friday, and Sunday at 6 PM. 10/01/19   Danford, Suann Larry, MD   Allergies  Allergen Reactions  . Enalapril Maleate Other (See Comments)    Other reaction(s): Headache  . Nitrofurantoin Swelling and Rash    Other Reaction: swelling of body  . Sulfamethoxazole-Trimethoprim Swelling  . 2,4-D Dimethylamine (Amisol) Rash and  Other (See Comments)    Other Reaction: h/a  . Baclofen Other (See Comments) and Nausea Only    lightheadness ,drowsiness , muscle weakness , twitching in hands   . Neosporin [Neomycin-Bacitracin Zn-Polymyx] Other (See Comments) and Rash    Other Reaction: irritation Skin irritation   . Quinine Nausea And Vomiting, Rash and Other (See Comments)    Other Reaction: Vomiting, rash, h/a, vision  . Ultram [Tramadol] Palpitations  . Zocor [Simvastatin] Other (See Comments) and Rash    Other Reaction: muscle spasms Muscle pain and spasms  . Bactrim [Sulfamethoxazole-Trimethoprim] Swelling  . Levodopa Other (See Comments)    Reaction: unknown  . Macrodantin [Nitrofurantoin Macrocrystal] Swelling  . Quinine Derivatives Other (See Comments)    Vertigo,nausea vomiting blurred vision headache ears sensitivity    Review of Systems  Constitutional: Positive for activity change.  Respiratory: Positive for shortness of breath.    Physical Exam Vitals signs and nursing note reviewed.  Constitutional:      General: She is awake.  Cardiovascular:     Rate and Rhythm: Normal rate.  Pulmonary:     Effort: No tachypnea, accessory muscle usage or respiratory distress.     Comments: Oxygen via nasal cannula Skin:    General: Skin is warm and dry.  Neurological:     Mental Status: She is alert and oriented to person, place, and time.  Psychiatric:        Mood and Affect: Mood normal.  Speech: Speech normal.        Behavior: Behavior normal.        Cognition and Memory: Cognition normal.    Vital Signs: BP (!) 159/64 (BP Location: Right Arm)   Pulse 80   Temp 98.2 F (36.8 C) (Oral)   Resp 12   Ht 4' 10"  (1.473 m)   Wt (P) 58.5 kg   SpO2 99%   BMI (P) 26.95 kg/m  Pain Scale: 0-10 POSS *See Group Information*: 1-Acceptable,Awake and alert Pain Score: 5    SpO2: SpO2: 99 % O2 Device:SpO2: 99 % O2 Flow Rate: .O2 Flow Rate (L/min): 2 L/min  IO: Intake/output summary:    Intake/Output Summary (Last 24 hours) at 10/16/2019 1609 Last data filed at 10/16/2019 1230 Gross per 24 hour  Intake 908.62 ml  Output 1700 ml  Net -791.38 ml    LBM: Last BM Date: 10/15/19 Baseline Weight: Weight: 57 kg Most recent weight: Weight: (P) 58.5 kg     Palliative Assessment/Data: PPS 50%     Time In: 1445 Time Out: 1600 Time Total: 75 Greater than 50%  of this time was spent counseling and coordinating care related to the above assessment and plan.  Signed by:  Ihor Dow, DNP, FNP-C Palliative Medicine Team  Phone: [phone] Fax: [phone]   Please contact Palliative Medicine Team phone at [phone] for questions and concerns.  For individual provider: See Shea Evans

## 2019-10-16 NOTE — Progress Notes (Signed)
[facility] Progress Note  Subjective: seen on HD, no c/o's, no SOB  Vitals:   10/16/19 0930 10/16/19 1000 10/16/19 1030 10/16/19 1100  BP: 137/64 135/66 (!) 127/49 (!) 143/43  Pulse: 64 69 70 69  Resp:      Temp:      TempSrc:      SpO2:      Weight:      Height:        Inpatient medications: . amLODipine  10 mg Oral Daily  . calcium acetate  1,334 mg Oral TID WC  . carvedilol  12.5 mg Oral BID WC  . Chlorhexidine Gluconate Cloth  6 each Topical Q0600  . clopidogrel  75 mg Oral Daily  . doxercalciferol  2.5 mcg Intravenous Q T,Th,Sa-HD  . famotidine  20 mg Oral Daily  . heparin  5,000 Units Subcutaneous Q8H  . hydrALAZINE  50 mg Oral Q8H  . lidocaine  1 patch Transdermal Q24H  . losartan  50 mg Oral Daily  . pravastatin  40 mg Oral q1800  . sodium chloride flush  3 mL Intravenous Q12H   . sodium chloride    . dextrose 20 mL/hr at 10/16/19 0800  . [START ON 10/21/2019] ferric gluconate (FERRLECIT/NULECIT) IV     sodium chloride, acetaminophen, benzonatate, HYDROmorphone (DILAUDID) injection, ipratropium-albuterol, ondansetron (ZOFRAN) IV, oxyCODONE-acetaminophen, sodium chloride flush    Exam: General: alert elderly female nad , wd, wn, appropriate  HEENT: [place] , not icteric, eomi, MMM Neck: no JVD Heart: RRR, 3/6 Systolic Mur.  No rub or gallops  Lungs: CTA , non labored breathing Abdomen: Bs pos, soft, NT,ND Extremities: No pedal edema Skin: no overt rash or pedal ulcers, warm dry Neuro: Alert O X3,moves all extremities, no acute focal deficits appreciated  Dialysis Access: Pos bruit L FA AVF  Dialysis: [facility] [address] TTS   58kg  2/2 bath  Hep none  2h 4min  LFA AVF   350/ 600     Hec 2.5  mcg IV/HD  Epogen 2400   Units IV/HD  Venofer  50mg  q hd weekly    Assessment/Plan  1. ESRD - HD TTS.  HD today upstairs.  Next HD Sat if here.  2. Severe Aortic stenosis - not good TAVR candidate, medical Rx 3. CAD - sp cath 11/24 severe  LM and ostial LAD disease. Cardiology managing "extremely high risk for PCI and not a candidate for CABG. Planned for medical therapy at this time. " 4. Hypertension/volume  - on 4 BP meds at home. Up 2kg today, UF same on HD today. 5. Anemia  - hgb 7.9  Give esa on hd  Weekly iron  On hd  6. Metabolic bone disease -  Ca / phos stable on Hectorol  on hd  And Calcium acetate binder 7. Dispo - for dc back to SNF today or tomorrow per primary notes   Rob Sheily Lineman 10/16/2019, 12:08 PM  Iron/TIBC/Ferritin/ %Sat    Component Value Date/Time   IRON 55 12/07/2014 2000   TIBC 206 (L) 12/07/2014 2000   FERRITIN 387 12/07/2014 2000   IRONPCTSAT 27 12/07/2014 2000   Recent Labs  Lab 10/13/19 0640  10/15/19 0307 10/16/19 0247  NA 136   < > 136 133*  K 4.3   < > 4.7 5.1  CL 97*   < > 96* 91*  CO2 26   < > 30 30  GLUCOSE 83   < > 137* 87  BUN 34*   < >  25* 33*  CREATININE 5.04*   < > 4.94* 6.02*  CALCIUM 8.2*   < > 8.3* 8.8*  PHOS 4.1  --   --   --   ALBUMIN 3.1*  --  2.8*  --   INR  --   --  1.1  --    < > = values in this interval not displayed.   Recent Labs  Lab 10/15/19 0307  AST 13*  ALT 11  ALKPHOS 46  BILITOT 0.6  PROT 4.7*   Recent Labs  Lab 10/15/19 0307  WBC 7.0  HGB 7.9*  HCT 25.0*  PLT 137*

## 2019-10-16 NOTE — Progress Notes (Signed)
Cardiology Progress Note  Patient ID: Phyllis [name] MRN: [id number] DOB: [birth year] Date of Encounter: 10/16/2019  Primary Cardiologist: Ida Rogue, MD  Subjective  No complaints this morning.  Did not have hemodialysis yesterday.  Going this morning.  Denies any chest pain or shortness of breath.  Plan is for her to return to her SNF when able.  ROS:  All other ROS reviewed and negative. Pertinent positives noted in the HPI.     Inpatient Medications  Scheduled Meds: . amLODipine  10 mg Oral Daily  . calcium acetate  1,334 mg Oral TID WC  . carvedilol  12.5 mg Oral BID WC  . Chlorhexidine Gluconate Cloth  6 each Topical Q0600  . clopidogrel  75 mg Oral Daily  . darbepoetin (ARANESP) injection - DIALYSIS  25 mcg Intravenous Q Thu-HD  . doxercalciferol  2.5 mcg Intravenous Q T,Th,Sa-HD  . famotidine  20 mg Oral Daily  . hydrALAZINE  50 mg Oral Q8H  . lidocaine  1 patch Transdermal Q24H  . losartan  50 mg Oral Daily  . pravastatin  40 mg Oral q1800  . sodium chloride flush  3 mL Intravenous Q12H   Continuous Infusions: . sodium chloride    . dextrose 20 mL/hr at 10/16/19 0800  . [START ON 10/21/2019] ferric gluconate (FERRLECIT/NULECIT) IV     PRN Meds: sodium chloride, acetaminophen, benzonatate, HYDROmorphone (DILAUDID) injection, ipratropium-albuterol, ondansetron (ZOFRAN) IV, oxyCODONE-acetaminophen, sodium chloride flush   Vital Signs   Vitals:   10/16/19 0353 10/16/19 0622 10/16/19 0700 10/16/19 0800  BP: (!) 145/50 (!) 145/50 (!) 147/45   Pulse: 69 67 78 71  Resp: 13  15 17   Temp: 98.4 F (36.9 C)  98 F (36.7 C)   TempSrc: Oral  Oral   SpO2: 99%  100% 98%  Weight: 60.6 kg     Height:        Intake/Output Summary (Last 24 hours) at 10/16/2019 0839 Last data filed at 10/16/2019 0800 Gross per 24 hour  Intake 945.62 ml  Output 325 ml  Net 620.62 ml   Last 3 Weights 10/16/2019 10/15/2019 10/14/2019  Weight (lbs) 133 lb 9.6 oz 137 lb 2 oz 125 lb  10.6 oz  Weight (kg) 60.6 kg 62.2 kg 57 kg      Telemetry  Overnight telemetry shows normal sinus rhythm with heart rate in the 60s 70s, which I personally reviewed.   Physical Exam   Vitals:   10/16/19 0353 10/16/19 0622 10/16/19 0700 10/16/19 0800  BP: (!) 145/50 (!) 145/50 (!) 147/45   Pulse: 69 67 78 71  Resp: 13  15 17   Temp: 98.4 F (36.9 C)  98 F (36.7 C)   TempSrc: Oral  Oral   SpO2: 99%  100% 98%  Weight: 60.6 kg     Height:         Intake/Output Summary (Last 24 hours) at 10/16/2019 0839 Last data filed at 10/16/2019 0800 Gross per 24 hour  Intake 945.62 ml  Output 325 ml  Net 620.62 ml    Last 3 Weights 10/16/2019 10/15/2019 10/14/2019  Weight (lbs) 133 lb 9.6 oz 137 lb 2 oz 125 lb 10.6 oz  Weight (kg) 60.6 kg 62.2 kg 57 kg    Body mass index is 27.92 kg/m.  General: Well nourished, well developed, in no acute distress Head: Atraumatic, normal size  Eyes: PEERLA, EOMI  Neck: Supple, no JVD Endocrine: No thryomegaly Cardiac: 3 out of 6 systolic murmur present at left  lower sternal border Lungs: Clear to auscultation bilaterally, no wheezing, rhonchi or rales  Abd: Soft, nontender, no hepatomegaly  Ext: No edema, pulses 2+ Musculoskeletal: No deformities, BUE and BLE strength normal and equal Skin: Warm and dry, no rashes   Neuro: Alert and oriented to person, place, time, and situation, CNII-XII grossly intact, no focal deficits  Psych: Normal mood and affect   Labs  High Sensitivity Troponin:   Recent Labs  Lab 09/22/19 1238 09/22/19 1853 10/01/19 1650 10/01/19 1836 10/11/19 0626  TROPONINIHS 204* 185* 23* 26* 64*     Cardiac EnzymesNo results for input(s): TROPONINI in the last 168 hours. No results for input(s): TROPIPOC in the last 168 hours.  Chemistry Recent Labs  Lab 10/11/19 0626  10/13/19 0640 10/14/19 0338 10/14/19 1451  10/14/19 1601 10/15/19 0307 10/16/19 0247  NA 134*   < > 136 140  --    < > 136 136 133*  K 5.0   < > 4.3  4.2  --    < > 4.4 4.7 5.1  CL 96*   < > 97* 97*  --   --   --  96* 91*  CO2 22   < > 26 34*  --   --   --  30 30  GLUCOSE 222*   < > 83 83  --   --   --  137* 87  BUN 28*   < > 34* 17  --   --   --  25* 33*  CREATININE 5.56*   < > 5.04* 3.37* 4.27*  --   --  4.94* 6.02*  CALCIUM 8.5*   < > 8.2* 8.2*  --   --   --  8.3* 8.8*  PROT 6.0*  --   --   --   --   --   --  4.7*  --   ALBUMIN 3.6  --  3.1*  --   --   --   --  2.8*  --   AST 37  --   --   --   --   --   --  13*  --   ALT 17  --   --   --   --   --   --  11  --   ALKPHOS 63  --   --   --   --   --   --  46  --   BILITOT 0.8  --   --   --   --   --   --  0.6  --   GFRNONAA 7*   < > 8* 13* 10*  --   --  8* 6*  GFRAA 8*   < > 9* 15* 11*  --   --  9* 7*  ANIONGAP 16*   < > 13 9  --   --   --  10 12   < > = values in this interval not displayed.    Hematology Recent Labs  Lab 10/14/19 0338 10/14/19 1451 10/14/19 1600 10/14/19 1601 10/15/19 0307  WBC 4.4 5.6  --   --  7.0  RBC 2.57* 2.89*  --   --  2.62*  HGB 7.5* 8.8* 8.5* 8.5* 7.9*  HCT 24.3* 27.5* 25.0* 25.0* 25.0*  MCV 94.6 95.2  --   --  95.4  MCH 29.2 30.4  --   --  30.2  MCHC 30.9 32.0  --   --  31.6  RDW 14.9 14.7  --   --  14.7  PLT 107* 117*  --   --  137*   BNP Recent Labs  Lab 10/11/19 0626  BNP 2,087.0*    DDimer No results for input(s): DDIMER in the last 168 hours.   Radiology  No results found.  Cardiac Studies  TTE 09/21/2019  1. Left ventricular ejection fraction, by visual estimation, is 60 to 65%. The left ventricle has normal function. There is mildly increased left ventricular hypertrophy.  2. Global right ventricle has normal systolic function.The right ventricular size is normal. No increase in right ventricular wall thickness.  3. Left atrial size was mild-moderately dilated.  4. Mild mitral valve regurgitation.  5. The tricuspid valve is normal in structure. Tricuspid valve regurgitation is not demonstrated.  6. The aortic valve was not  well visualized. Measurements as detailed below, Moderate aortic valve stenosis by gradients, severe stenosis by estimated AVA.  7. TR signal is inadequate for assessing pulmonary artery systolic pressure.  8. Aortic valve mean gradient measures 20.6 mmHg.  9. Aortic valve peak gradient measures 32.0 mmHg. 10. Aortic valve area, by VTI measures 0.68 cm.  LHC/RHC 10/14/2019  Severe, calcified ostial to distal left main, 80%.  Severe ostial LAD, 99% followed by an eccentric 50 to 70% proximal LAD after the first diagonal.  Ostial to proximal 60% circumflex.  The second obtuse marginal contains segmental 60 to 70% narrowing.  The circumflex is dominant.  Nondominant RCA.  Moderately severe to severe aortic stenosis with valve area of 0.99 cm, peak to peak gradient 20 mmHg, mean gradient 28 mmHg, based upon cardiac output of 7.2 L/min and corresponding cardiac index of 4.83 L/min/m  Severe ascending and descending aortic calcification  RECOMMENDATIONS   Heart team approach: Determine if patient has surgical options with SAVR and CABG.  Consider extremely high risk PCI which would require hemodynamic support.  Consider palliative care given end-stage kidney disease on dialysis, severe anemia, and poor long-term prognosis even if stent therapy is successful.  Discussed with Dr. Burt Knack.  Patient Profile  Phyllis [name] is a 73 y.o. female with ESRD on hemodialysis, recurrent flash pulmonary edema, recent PEA arrest, aortic atherosclerosis, diabetes, hypertension, obesity, COPD, OSA admitted to [facility] on 11/21 for respiratory failure and PEA arrest.  Subsequently transferred to [facility] for evaluation of severe aortic stenosis.  Assessment & Plan  1.  Severe aortic stenosis: Left heart cath with severe CAD including left main disease.  Also has severe stenosis.  Not a candidate for CABG or high risk PCI at this time.  Also not a good candidate for TAVR.  Case  has been reviewed by the multidisciplinary heart team with the same conclusion.  Recommend home with palliative, hospice care.  She reports she will go back to a SNF.  We will have palliative care see her today.  2.  Severe multivessel CAD: Left heart cath with severe left main and ostial LAD disease.  Extremely high risk PCI not a CABG candidate.  Medical therapy is planned at this time.  This is likely the etiology of her recurrent pulmonary edema.  Nonetheless, she will likely be a good candidate for hospice and palliative care.  She has an allergy to aspirin and will remain on Plavix.  She is also on Coreg 12.5 mg twice daily.  No symptoms of angina reported today.  She will remain on pravastatin 40 mg daily.  3.  Respiratory failure  status post PEA arrest: Was noted to have PEA arrest due to respiratory failure at [facility].  She appears to be doing well on room air here.  Plan for SNF.  4.  ESRD on hemodialysis: Plan for HD today.  5.  Hypertension: Coreg 12.5 mg twice daily, amlodipine 10 mg daily, hydralazine 50 mg 3 times daily, losartan 50 mg daily.  FEN -No intravenous fluids -Daily BMP -DVT PPx: Heparin -Code: DNR -Disposition: We will discuss with social work to get her back to SNF today.  The plan on 11/23 was to return to [facility] skilled nursing facility.  Given that today is Thanksgiving day I doubt she will go today.  We will reach out to see if we can get her back today or possibly tomorrow.  For questions or updates, please contact [place] Please consult [email or web address] for contact info under   Signed, [place] T. Audie Box, [place]  10/16/2019 8:39 AM

## 2019-10-16 NOTE — Progress Notes (Signed)
PMT consult received and chart reviewed. Patient has a good understanding of diagnoses and poor long-term prognosis. Patient has completed AD/HCPOA packet and confirms DNR code status. At this time, she wishes to continue hemodialysis and plans to discharge back to SNF with hopes of becoming strong enough to return home with caregivers in the future. She unfortunately is not a candidate for outpatient hospice services at this time due to wishes to continue hemodialysis. Patient agreeable to outpatient palliative referral at SNF.  Full palliative consult note to follow.   NO CHARGE  Ihor Dow, [place], FNP-C Palliative Medicine Team  Phone: [phone] Fax: [phone]

## 2019-10-16 NOTE — TOC Progression Note (Signed)
Transition of Care [facility]) - Progression Note    Patient Details  Name: Phyllis [name] MRN: [id number] Date of Birth: 10-19-1946  Transition of Care [facility]) CM/SW [place], [place] Phone Number: 10/16/2019, 9:30 AM  Clinical Narrative:     Patient is medically ready to discharge back to [facility]. CSW received a phone call from Dr. Audie Box.   CSW called [facility] and spoke with Jonelle Sidle in the business office. Due to the holiday, they would not be able to fill the patients prescriptions because their pharmacy is closed. CSW thanked her for her time.   CSW called Dr. Audie Box back and informed him that the would be able to take the patient back on Friday.   CSW will continue to follow and assist with discharge planning.   Expected Discharge Plan: [place] Barriers to Discharge: Continued Medical Work up  Expected Discharge Plan and Services Expected Discharge Plan: [place] In-house Referral: Clinical Social Work Discharge Planning Services: NA Post Acute Care Choice: [place] Living arrangements for the past 2 months: [place]                 DME Arranged: N/A DME Agency: NA       HH Arranged: NA [place] Agency: NA         Social Determinants of Health (SDOH) Interventions    Readmission Risk Interventions Readmission Risk Prevention Plan 10/13/2019 09/26/2019 05/07/2019  Transportation Screening - Complete Complete  Medication Review Press photographer) - Complete Complete  PCP or Specialist appointment within 3-5 days of discharge Complete Complete Complete  HRI or [place] (No Data) Complete Complete  SW Recovery Care/Counseling Consult Complete Complete -  Palliative Care Screening Not Applicable Not Applicable Not Applicable  Skilled Nursing Facility Complete Not Applicable Not Applicable  Some recent data might be hidden

## 2019-10-16 NOTE — NC FL2 (Signed)
[place] LEVEL OF CARE SCREENING TOOL     IDENTIFICATION  [name] Birthdate: [birth year] Sex: female Admission Date (Current Location): 10/14/2019  [facility] and [place] Number:  Herbalist and Address:         Provider Number: [id number]  Attending Physician Name and Address:  Sherren Mocha, MD  Relative Name and Phone Number:  Mariha, [place], [phone]    Current Level of Care: Hospital Recommended Level of Care: [place] Prior Approval Number:    Date Approved/Denied:   PASRR Number: [id number]  Discharge Plan: SNF    Current Diagnoses: Patient Active Problem List   Diagnosis Date Noted  . Flash pulmonary edema ([place]) 10/14/2019  . Respiratory failure with hypoxia ([place]) 10/11/2019  . Anemia   . Acute on chronic respiratory failure with hypoxia ([place]) 10/01/2019  . Severe aortic valve stenosis   . Acute respiratory failure with hypoxemia ([place]) 09/20/2019  . COPD with acute bronchitis ([place]) 09/05/2019  . Congestive heart failure ([place]) 05/24/2019  . Hyperkalemia 11/29/2018  . Chronic diastolic heart failure ([place]) 11/02/2018  . HTN (hypertension) 11/02/2018  . Uncontrolled hypertension 10/21/2018  . History of acute pulmonary edema 10/21/2018  . Pressure injury of skin 03/16/2018  . CVA (cerebral vascular accident) ([place]) 01/16/2018  . Aortic atherosclerosis ([place]) 12/11/2017  . Chest pain 09/18/2017  . Hyperlipidemia 08/06/2015  . Complication from renal dialysis device 04/12/2015  . SOB (shortness of breath) 02/01/2015  . End stage renal disease on dialysis ([place]) 02/01/2015  . Type 2 diabetes mellitus with other specified complication ([place]) [id number]  . Asthma 02/01/2015  . Acute on chronic diastolic CHF (congestive heart failure) ([place]) 02/01/2015    Orientation RESPIRATION BLADDER Height & Weight     Self, Time, Situation, Place  O2(98, , 2L) Continent, External catheter  Weight: 133 lb 9.6 oz (60.6 kg) Height:  4\' 10"  (147.3 cm)  BEHAVIORAL SYMPTOMS/MOOD NEUROLOGICAL BOWEL NUTRITION STATUS      Continent Diet(Renal/carb modified diet, thin liquids,fluid restriction 124ml, no snacks)  AMBULATORY STATUS COMMUNICATION OF NEEDS Skin     Verbally Bruising, Other (Comment)(bruising on thigh, excoriated skin on back, MASD on groin)                       Personal Care Assistance Level of Assistance  Bathing, Feeding, Dressing Bathing Assistance: Maximum assistance Feeding assistance: Limited assistance Dressing Assistance: Maximum assistance     Functional Limitations Info  Sight, Hearing, Speech Sight Info: Impaired Hearing Info: Adequate Speech Info: Adequate    SPECIAL CARE FACTORS FREQUENCY  PT (By licensed PT), OT (By licensed OT)     PT Frequency: 5x/wk OT Frequency: 5x/wk            Contractures Contractures Info: Not present    Additional Factors Info  Code Status, Allergies Code Status Info: DNR Allergies Info: Enalapril Maleate, Nitrofurantoin, Sulfamethoxazole-trimethoprim, 2,4-d Dimethylamine (Amisol), Baclofen, Neosporin (Neomycin-bacitracin Zn-polymyx), Quinine, Ultram (Tramadol), Zocor (Simvastatin), Bactrim (Sulfamethoxazole-trimethoprim), Levodopa, Macrodantin (Nitrofurantoin Macrocrystal), Quinine Derivatives           Current Medications (10/16/2019):  This is the current hospital active medication list Current Facility-Administered Medications  Medication Dose Route Frequency Provider Last Rate Last Dose  . 0.9 %  sodium chloride infusion  250 mL Intravenous PRN Belva Crome, MD      . acetaminophen (TYLENOL) tablet 650 mg  650 mg Oral Q4H PRN Belva Crome, MD      .  amLODipine (NORVASC) tablet 10 mg  10 mg Oral Daily Belva Crome, MD   10 mg at 10/15/19 0819  . benzonatate (TESSALON) capsule 100 mg  100 mg Oral BID PRN Belva Crome, MD      . calcium acetate (PHOSLO) capsule 1,334 mg  1,334 mg Oral TID WC  Belva Crome, MD   1,334 mg at 10/16/19 0751  . carvedilol (COREG) tablet 12.5 mg  12.5 mg Oral BID WC Belva Crome, MD   12.5 mg at 10/16/19 [id number]  . Chlorhexidine Gluconate Cloth 2 % PADS 6 each  6 each Topical Q0600 Belva Crome, MD   6 each at 10/16/19 [phone]  . clopidogrel (PLAVIX) tablet 75 mg  75 mg Oral Daily Belva Crome, MD   75 mg at 10/15/19 [id number]  . Darbepoetin Alfa (ARANESP) injection 25 mcg  25 mcg Intravenous Q Thu-HD Einar Grad, RPH      . dextrose 5 % solution   Intravenous Continuous Belva Crome, MD 20 mL/hr at 10/16/19 0800    . doxercalciferol (HECTOROL) injection 2.5 mcg  2.5 mcg Intravenous Q T,Th,Sa-HD Zeyfang, David, PA-C      . famotidine (PEPCID) tablet 20 mg  20 mg Oral Daily Belva Crome, MD   20 mg at 10/15/19 0818  . [START ON 10/21/2019] ferric gluconate (NULECIT) 62.5 mg in sodium chloride 0.9 % 100 mL IVPB  62.5 mg Intravenous Q Tue-HD Zeyfang, David, PA-C      . heparin injection 5,000 Units  5,000 Units Subcutaneous Q8H O'Neal, Cassie Freer, MD      . hydrALAZINE (APRESOLINE) tablet 50 mg  50 mg Oral Q8H Belva Crome, MD   50 mg at 10/16/19 [id number]  . HYDROmorphone (DILAUDID) injection 0.5 mg  0.5 mg Intravenous Q4H PRN Belva Crome, MD   0.5 mg at 10/15/19 1939  . ipratropium-albuterol (DUONEB) 0.5-2.5 (3) MG/3ML nebulizer solution 3 mL  3 mL Nebulization Q6H PRN Belva Crome, MD      . lidocaine (LIDODERM) 5 % 1 patch  1 patch Transdermal Q24H Belva Crome, MD   1 patch at 10/15/19 1305  . losartan (COZAAR) tablet 50 mg  50 mg Oral Daily Belva Crome, MD   50 mg at 10/15/19 [id number]  . ondansetron (ZOFRAN) injection 4 mg  4 mg Intravenous Q6H PRN Belva Crome, MD   4 mg at 10/15/19 1317  . oxyCODONE-acetaminophen (PERCOCET/ROXICET) 5-325 MG per tablet 1 tablet  1 tablet Oral Q6H PRN Belva Crome, MD   1 tablet at 10/15/19 1038  . pravastatin (PRAVACHOL) tablet 40 mg  40 mg Oral q1800 Belva Crome, MD   40 mg at 10/15/19 1701  . sodium  chloride flush (NS) 0.9 % injection 3 mL  3 mL Intravenous Q12H Belva Crome, MD   3 mL at 10/15/19 2156  . sodium chloride flush (NS) 0.9 % injection 3 mL  3 mL Intravenous PRN Belva Crome, MD         Discharge Medications: Please see discharge summary for a list of discharge medications.  Relevant Imaging Results:  Relevant Lab Results:   Additional Information SSN: [id number]; HD [facility] [place] TTS 11:45am  Nhi Butrum B Kaylan Yates, LCSWA

## 2019-10-16 NOTE — Plan of Care (Signed)
  Problem: Education: Goal: Knowledge of General Education information will improve Description: Including pain rating scale, medication(s)/side effects and non-pharmacologic comfort measures Outcome: Progressing   Problem: Health Behavior/Discharge Planning: Goal: Ability to manage health-related needs will improve Outcome: Progressing   Problem: Clinical Measurements: Goal: Ability to maintain clinical measurements within normal limits will improve Outcome: Progressing Goal: Will remain free from infection Outcome: Progressing Goal: Diagnostic test results will improve Outcome: Progressing Goal: Respiratory complications will improve Outcome: Progressing Goal: Cardiovascular complication will be avoided Outcome: Progressing   Problem: Activity: Goal: Risk for activity intolerance will decrease Outcome: Progressing   Problem: Nutrition: Goal: Adequate nutrition will be maintained Outcome: Progressing   Problem: Coping: Goal: Level of anxiety will decrease Outcome: Progressing   Problem: Elimination: Goal: Will not experience complications related to bowel motility Outcome: Progressing Goal: Will not experience complications related to urinary retention Outcome: Progressing   Problem: Pain Managment: Goal: General experience of comfort will improve Outcome: Progressing   Problem: Safety: Goal: Ability to remain free from injury will improve Outcome: Progressing   Problem: Skin Integrity: Goal: Risk for impaired skin integrity will decrease Outcome: Progressing   Problem: Education: Goal: Individualized Educational Video(s) Outcome: Progressing   Problem: Activity: Goal: Ability to return to baseline activity level will improve Outcome: Progressing   Problem: Cardiovascular: Goal: Ability to achieve and maintain adequate cardiovascular perfusion will improve Outcome: Progressing Goal: Vascular access site(s) Level 0-1 will be maintained Outcome:  Progressing   Problem: Health Behavior/Discharge Planning: Goal: Ability to safely manage health-related needs after discharge will improve Outcome: Progressing

## 2019-10-17 DIAGNOSIS — Z515 Encounter for palliative care: Secondary | ICD-10-CM

## 2019-10-17 DIAGNOSIS — Z7189 Other specified counseling: Secondary | ICD-10-CM

## 2019-10-17 LAB — BASIC METABOLIC PANEL
Anion gap: 11 (ref 5–15)
BUN: 18 mg/dL (ref 8–23)
CO2: 28 mmol/L (ref 22–32)
Calcium: 8.5 mg/dL — ABNORMAL LOW (ref 8.9–10.3)
Chloride: 94 mmol/L — ABNORMAL LOW (ref 98–111)
Creatinine, Ser: 3.85 mg/dL — ABNORMAL HIGH (ref 0.44–1.00)
GFR calc Af Amer: 13 mL/min — ABNORMAL LOW (ref >60)
GFR calc non Af Amer: 11 mL/min — ABNORMAL LOW (ref >60)
Glucose, Bld: 97 mg/dL (ref 70–99)
Potassium: 4.5 mmol/L (ref 3.5–5.1)
Sodium: 133 mmol/L — ABNORMAL LOW (ref 135–145)

## 2019-10-17 LAB — GLUCOSE, CAPILLARY: Glucose-Capillary: 98 mg/dL (ref 70–99)

## 2019-10-17 MED ORDER — PANTOPRAZOLE SODIUM 40 MG PO TBEC: 40.0000 mg | DELAYED_RELEASE_TABLET | Freq: Every day | ORAL | 1 refills | Status: DC

## 2019-10-17 MED ORDER — BENZONATATE 100 MG PO CAPS: 100.0000 mg | ORAL_CAPSULE | Freq: Two times a day (BID) | ORAL | 0 refills | Status: DC | PRN

## 2019-10-17 MED ORDER — IPRATROPIUM-ALBUTEROL 0.5-2.5 (3) MG/3ML IN SOLN: 3.0000 mL | Freq: Four times a day (QID) | RESPIRATORY_TRACT | Status: AC | PRN

## 2019-10-17 NOTE — Progress Notes (Addendum)
Progress Note  [name] Date of Encounter: 10/17/2019  Primary Cardiologist: Ida Rogue, MD   Subjective   No chest pain this morning. No dyspnea.   Inpatient Medications    Scheduled Meds:  amLODipine  10 mg Oral Daily   calcium acetate  1,334 mg Oral TID WC   carvedilol  12.5 mg Oral BID WC   Chlorhexidine Gluconate Cloth  6 each Topical Q0600   clopidogrel  75 mg Oral Daily   doxercalciferol  2.5 mcg Intravenous Q T,Th,Sa-HD   famotidine  20 mg Oral Daily   heparin  5,000 Units Subcutaneous Q8H   hydrALAZINE  50 mg Oral Q8H   lidocaine  1 patch Transdermal Q24H   losartan  50 mg Oral Daily   pravastatin  40 mg Oral q1800   sodium chloride flush  3 mL Intravenous Q12H   Continuous Infusions:  sodium chloride     dextrose 20 mL/hr at 10/16/19 1700   [START ON 10/21/2019] ferric gluconate (FERRLECIT/NULECIT) IV     PRN Meds: sodium chloride, acetaminophen, benzonatate, HYDROmorphone (DILAUDID) injection, ipratropium-albuterol, ondansetron (ZOFRAN) IV, oxyCODONE-acetaminophen, sodium chloride flush   Vital Signs    Vitals:   10/17/19 0615 10/17/19 0655 10/17/19 0656 10/17/19 0722  BP: (!) 163/46 (!) 163/46 (!) 163/46 (!) 139/40  Pulse:   66 69  Resp: 17   14  Temp:    98.1 F (36.7 C)  TempSrc:    Oral  SpO2:    100%  Weight:      Height:        Intake/Output Summary (Last 24 hours) at 10/17/2019 0825 Last data filed at 10/17/2019 0400 Gross per 24 hour  Intake 571.47 ml  Output 1625 ml  Net -1053.53 ml   Last 3 Weights 10/17/2019 10/16/2019 10/16/2019  Weight (lbs) 130 lb 11.7 oz 128 lb 15.5 oz 132 lb 4.4 oz  Weight (kg) 59.3 kg 58.5 kg 60 kg      Telemetry    Sinus - Personally Reviewed  ECG    No AM EKG.   Physical Exam   General: Well developed, well nourished, NAD  HEENT: OP clear, mucus membranes moist  SKIN: warm, dry. No rashes. Neuro: No focal deficits  Musculoskeletal: Muscle strength  5/5 all ext  Psychiatric: Mood and affect normal  Neck: No JVD, no carotid bruits, no thyromegaly, no lymphadenopathy.  Lungs:Clear bilaterally, no wheezes, rhonci, crackles Cardiovascular: Regular rate and rhythm. Systolic murmur noted.  Abdomen:Soft. Bowel sounds present. Non-tender.  Extremities: No lower extremity edema. Pulses are 2 + in the bilateral DP/PT.  Labs    High Sensitivity Troponin:   Recent Labs  Lab 09/22/19 1238 09/22/19 1853 10/01/19 1650 10/01/19 1836 10/11/19 0626  TROPONINIHS 204* 185* 23* 26* 64*      Chemistry Recent Labs  Lab 10/11/19 0626  10/13/19 0640  10/15/19 0307 10/16/19 0247 10/17/19 0245  NA 134*   < > 136   < > 136 133* 133*  K 5.0   < > 4.3   < > 4.7 5.1 4.5  CL 96*   < > 97*   < > 96* 91* 94*  CO2 22   < > 26   < > 30 30 28   GLUCOSE 222*   < > 83   < > 137* 87 97  BUN 28*   < > 34*   < > 25* 33* 18  CREATININE 5.56*   < > 5.04*   < >  4.94* 6.02* 3.85*  CALCIUM 8.5*   < > 8.2*   < > 8.3* 8.8* 8.5*  PROT 6.0*  --   --   --  4.7*  --   --   ALBUMIN 3.6  --  3.1*  --  2.8*  --   --   AST 37  --   --   --  13*  --   --   ALT 17  --   --   --  11  --   --   ALKPHOS 63  --   --   --  46  --   --   BILITOT 0.8  --   --   --  0.6  --   --   GFRNONAA 7*   < > 8*   < > 8* 6* 11*  GFRAA 8*   < > 9*   < > 9* 7* 13*  ANIONGAP 16*   < > 13   < > 10 12 11    < > = values in this interval not displayed.     Hematology Recent Labs  Lab 10/14/19 0338 10/14/19 1451 10/14/19 1600 10/14/19 1601 10/15/19 0307  WBC 4.4 5.6  --   --  7.0  RBC 2.57* 2.89*  --   --  2.62*  HGB 7.5* 8.8* 8.5* 8.5* 7.9*  HCT 24.3* 27.5* 25.0* 25.0* 25.0*  MCV 94.6 95.2  --   --  95.4  MCH 29.2 30.4  --   --  30.2  MCHC 30.9 32.0  --   --  31.6  RDW 14.9 14.7  --   --  14.7  PLT 107* 117*  --   --  137*    BNP Recent Labs  Lab 10/11/19 0626  BNP 2,087.0*     DDimer No results for input(s): DDIMER in the last 168 hours.   Radiology    No results  found.  Cardiac Studies   Cath: 10/14/19   Severe, calcified ostial to distal left main, 80%.  Severe ostial LAD, 99% followed by an eccentric 50 to 70% proximal LAD after the first diagonal.  Ostial to proximal 60% circumflex.  The second obtuse marginal contains segmental 60 to 70% narrowing.  The circumflex is dominant.  Nondominant RCA.  Moderately severe to severe aortic stenosis with valve area of 0.99 cm, peak to peak gradient 20 mmHg, mean gradient 28 mmHg, based upon cardiac output of 7.2 L/min and corresponding cardiac index of 4.83 L/min/m  Severe ascending and descending aortic calcification  RECOMMENDATIONS   Heart team approach: Determine if patient has surgical options with SAVR and CABG.  Consider extremely high risk PCI which would require hemodynamic support.  Consider palliative care given end-stage kidney disease on dialysis, severe anemia, and poor long-term prognosis even if stent therapy is successful.  Discussed with Dr. Burt Knack.  Diagnostic Dominance: Left  TTE: 09/21/19   IMPRESSIONS    1. Left ventricular ejection fraction, by visual estimation, is 60 to 65%. The left ventricle has normal function. There is mildly increased left ventricular hypertrophy.  2. Global right ventricle has normal systolic function.The right ventricular size is normal. No increase in right ventricular wall thickness.  3. Left atrial size was mild-moderately dilated.  4. Mild mitral valve regurgitation.  5. The tricuspid valve is normal in structure. Tricuspid valve regurgitation is not demonstrated.  6. The aortic valve was not well visualized. Measurements as detailed below, Moderate aortic valve stenosis by gradients,  severe stenosis by estimated AVA.  7. TR signal is inadequate for assessing pulmonary artery systolic pressure.  8. Aortic valve mean gradient measures 20.6 mmHg.  9. Aortic valve peak gradient measures 32.0 mmHg. 10. Aortic valve area, by VTI  measures 0.68 cm.  Patient Profile     73 y.o. female with severe aortic valve stenosis, ESRD on HD TTS, recurrent flash pulmonary edema, recent respiratory/PEA arrest, anemia of chronic disease, aortic atherosclerosis, anxiety, DM, HTN, HLD, obesity, asthma, COPD, OSA, and physical deconditioning who was admitted to [facility] on 11/21 with respiratory failure complicated by PEA arrest and subsequently transferred to [facility] for further evaluation. She is not felt to be a candidate for TAVR or high risk left main PCI.   Assessment & Plan    1. Severe Aortic Stenosis: transferred from [facility] to [facility] for possible TAVR work up. Peak to peak gradient of 20 mmHg and mean gradient 28 mmHg with calculated aortic valve area 0.99 cm. Underwent cardiac cath and found to have severe LM and ostial LAD disease. Not felt to be a candidate for CABG or high risk PCI at this time. Overall prognosis is poor. She has been seen by our palliative care team. She is now DNR. Will discharge to SNF today. She will not have home Hospice as she wishes to continue hemodialysis.   2. CAD: severe LM and ostial LAD disease on cath 10/14/19. As above would be extremely high risk for PCI and not a candidate for CABG.  -will continue Plavix, statin, amlodipine, Coreg and Losartan.   3. Respiratory failure c/b PEA arrest: presented to [facility] with respiratory failure and then PEA arrest while at [facility] requiring brief CPR along with mechanical ventilation. Volume status stable today.   4. Anemia of chronic disease: Hgb stable   5. ESRD on HD: Nephrology following.   Dispo: Discharge to SNF today. Follow up with Dr. Rockey Situ 1-2 weeks.    For questions or updates, please contact [place] Please consult [email or web address] for contact info under        Signed, Lauree Chandler, MD  10/17/2019, 8:25 AM

## 2019-10-17 NOTE — TOC Transition Note (Addendum)
Transition of Care [facility]) - CM/SW Discharge Note   Patient Details  Name: Phyllis [name] MRN: [id number] Date of Birth: 1946/03/12  Transition [facility] Care [facility]) CM/SW Contact:  Alberteen Sam, LCSW Phone Number: 10/17/2019, 8:57 AM   Clinical Narrative:     Patient will DC to: [place] Anticipated DC date: 10/17/2019 Family notified: [name] Transport [id number]  Per MD patient ready for DC to [facility] . RN, patient, patient's family, and facility notified of DC. Discharge Summary sent to facility. RN given number for report   [phone] Room 203B. DC packet on chart. Ambulance transport requested for patient.   Referral for outpatient palliative made to Trixie Dredge with hospice and palliative of [place], to follow up with patient and facility to ensure patient receives outpatient palliative services at [facility].   CSW signing off.  [place], [place]    Final next level of care: Skilled Nursing Facility Barriers to Discharge: No Barriers Identified   Patient Goals and CMS Choice   CMS Medicare.gov Compare Post Acute Care list provided to:: Patient Choice offered to / list presented to : Patient  Discharge Placement PASRR number recieved: 10/16/19            Patient chooses bed at: [facility] Patient to be transferred to facility by: [place] Name of family member notified: Phoebe Sharps Patient and family notified of of transfer: 10/17/19  Discharge Plan and Services In-house Referral: Clinical Social Work Discharge Planning Services: NA Post Acute Care Choice: [place]          DME Arranged: N/A DME Agency: NA       HH Arranged: NA [place] Agency: NA        Social Determinants of Health (SDOH) Interventions     Readmission Risk Interventions Readmission Risk Prevention Plan 10/13/2019 09/26/2019 05/07/2019  Transportation Screening - Complete Complete  Medication Review Press photographer) - Complete Complete   PCP or Specialist appointment within 3-5 days of discharge Complete Complete Complete  HRI or [place] (No Data) Complete Complete  SW Recovery Care/Counseling Consult Complete Complete -  Palliative Care Screening Not Applicable Not Applicable Not Applicable  Skilled Nursing Facility Complete Not Applicable Not Applicable  Some recent data might be hidden

## 2019-10-17 NOTE — Plan of Care (Signed)
  Problem: Education: Goal: Knowledge of General Education information will improve Description: Including pain rating scale, medication(s)/side effects and non-pharmacologic comfort measures Outcome: Progressing   Problem: Health Behavior/Discharge Planning: Goal: Ability to manage health-related needs will improve Outcome: Progressing   Problem: Clinical Measurements: Goal: Ability to maintain clinical measurements within normal limits will improve Outcome: Progressing Goal: Will remain free from infection Outcome: Progressing Goal: Diagnostic test results will improve Outcome: Progressing Goal: Respiratory complications will improve Outcome: Progressing Goal: Cardiovascular complication will be avoided Outcome: Progressing   Problem: Activity: Goal: Risk for activity intolerance will decrease Outcome: Progressing   Problem: Nutrition: Goal: Adequate nutrition will be maintained Outcome: Progressing   Problem: Coping: Goal: Level of anxiety will decrease Outcome: Progressing   Problem: Elimination: Goal: Will not experience complications related to bowel motility Outcome: Progressing Goal: Will not experience complications related to urinary retention Outcome: Progressing   Problem: Pain Managment: Goal: General experience of comfort will improve Outcome: Progressing   Problem: Safety: Goal: Ability to remain free from injury will improve Outcome: Progressing   Problem: Skin Integrity: Goal: Risk for impaired skin integrity will decrease Outcome: Progressing   Problem: Education: Goal: Individualized Educational Video(s) Outcome: Progressing   Problem: Activity: Goal: Ability to return to baseline activity level will improve Outcome: Progressing   Problem: Health Behavior/Discharge Planning: Goal: Ability to safely manage health-related needs after discharge will improve Outcome: Progressing

## 2019-10-17 NOTE — Discharge Summary (Addendum)
Discharge Summary    Patient ID: Phyllis [name] MRN: [id number]; DOB: November 29, 1945  Admit date: 10/14/2019 Discharge date: 10/17/2019  Primary Care Provider: [name], MD  Primary Cardiologist: Ida Rogue, MD  Primary Electrophysiologist:  None   Discharge Diagnoses    Principal Problem:   Severe aortic valve stenosis Active Problems:   End stage renal disease on dialysis [facility])   Type 2 diabetes mellitus with other specified complication ([place])   Acute on chronic diastolic CHF (congestive heart failure) ([place])   Hyperlipidemia   Aortic atherosclerosis ([place])   History of acute pulmonary edema   HTN (hypertension)   Anemia   Respiratory failure with hypoxia ([place])   Flash pulmonary edema ([place])    Diagnostic Studies/Procedures    Left heart cath 10/14/19: Cath: 10/14/19   Severe, calcified ostial to distal left main, 80%.  Severe ostial LAD, 99% followed by an eccentric 50 to 70% proximal LAD after the first diagonal.  Ostial to proximal 60% circumflex. The second obtuse marginal contains segmental 60 to 70% narrowing. The circumflex is dominant.  Nondominant RCA.  Moderately severe to severe aortic stenosis with valve area of 0.99 cm, peak to peak gradient 20 mmHg, mean gradient 28 mmHg, based upon cardiac output of 7.2 L/min and corresponding cardiac index of 4.83 L/min/m  Severe ascending and descending aortic calcification  RECOMMENDATIONS   Heart team approach: Determine if patient has surgical options with SAVR and CABG. Consider extremely high risk PCI which would require hemodynamic support. Consider palliative care given end-stage kidney disease on dialysis, severe anemia, and poor long-term prognosis even if stent therapy is successful.  Discussed with Dr. Burt Knack.   Diagnostic Dominance: Left TTE: 09/21/19 IMPRESSIONS  1. Left ventricular ejection fraction, by visual estimation, is 60 to 65%. The left ventricle has normal  function. There is mildly increased left ventricular hypertrophy. 2. Global right ventricle has normal systolic function.The right ventricular size is normal. No increase in right ventricular wall thickness. 3. Left atrial size was mild-moderately dilated. 4. Mild mitral valve regurgitation. 5. The tricuspid valve is normal in structure. Tricuspid valve regurgitation is not demonstrated. 6. The aortic valve was not well visualized. Measurements as detailed below, Moderate aortic valve stenosis by gradients, severe stenosis by estimated AVA. 7. TR signal is inadequate for assessing pulmonary artery systolic pressure. 8. Aortic valve mean gradient measures 20.6 mmHg. 9. Aortic valve peak gradient measures 32.0 mmHg. 10. Aortic valve area, by VTI measures 0.68 cm.  _____________   History of Present Illness     Phyllis [name] is a 73 y.o. female with severe aortic valve stenosis, ESRD on HD TTS,recurrent flash pulmonary edema, recent respiratory/PEA arrest,anemia of chronic disease, aortic atherosclerosis, anxiety, DM, HTN, HLD, obesity, asthma, COPD, OSA, and physical deconditioningwho was admitted to [facility] on 11/21 with respiratory failure complicated by PEA arrest and subsequently transferred to [facility] for further evaluation.   Phyllis [name] known chronic chest pain felt to be secondary to costochondritis with negative stress testing in the past. She has known aortic stenosis that has been followed with periodic echo. Echo in 04/2019, during admission for atypical chest pain, showed normal LV systolic function, moderate aortic stenosis with a mean gradient 25 mmHg and valve area 0.98 cm. No further ischemic cardiac work-up was recommended. She was admitted to the hospital in 05/2019 with volume overload with volume managed by hemodialysis. There was some concern she may have low flow low gradient severe aortic stenosis. More recently, she was admitted  to the hospital  earlier this month with flash pulmonary edema requiring intubation for airway protection and underwent daily hemodialysis with subsequent improvement in volume status. Repeat echo on 09/21/2019 showed an EF of 60 to 65%, mild LVH, normal RV systolic function and cavity size, mild to moderate left atrial dilatation, mild mitral regurgitation, moderate aortic valve stenosis by gradients and severe stenosis by valve area with a mean gradient of 20.6 mmHg and a valve area of 0.68 cm. It was recommended her Lasix be changed to torsemide on non-dialysis days for further optimization of volume management. In the setting of her aortic valve disease her euvolemic window has been quite narrow. She was also treated with a 7-day course of antibiotic for possible pneumonia. She was noted to be significantly deconditioned with recommendation to pursue aggressive work-up with physical therapy followed by outpatient evaluation by the structural heart team. She was subsequently discharged to [facility] on 09/30/2019 after undergoing her regular hemodialysis. While at [facility] she developed sudden shortness of breath while laying in the bed 2 days after her discharge and was brought back to [facility] with flash pulmonary edema requiring emergent hemodialysis.  She was noted to have a downtrending hemoglobin and underwent packed red blood cell transfusion x1.  It was recommended she undergo more aggressive dialysis and follow-up with structural heart team as outpatient.  She was subsequently discharged on 10/08/2019.  She returned to [facility] on 10/11/19 with respiratory distress and recurrent flash pulmonary edema complicated by PEA arrest requiring brief episode of CPR and mechanical ventilation.  She underwent emergent hemodialysis.  Initially, plans were for palliative/comfort care given her comorbid conditions and overall poor prognosis.  However, upon extubation the patient was alert and oriented and wished to  pursue further aggressive care.  She has been at her baseline following this initial emergent hemodialysis.  Cardiology was consulted on 11/23 as the patient was felt to have severe aortic stenosis. Recommended transfer to [facility] for further evaluation.    Hospital Course     Consultants: none  Severe aortic stenosis Transferred from [facility] for evaluation for possible TAVR. Peak to peak gradient of 20 mmHg and mean gradient 28 mmHg. Calculated aortic valve area 0.99 cm2. Heart catheterization 10/14/19 with severe left main and ostial LAD disease. Unfortunately, she is not a candidate for sternotomy/CABG or high risk PCI. Overall prognosis is poor. Palliative care team has consulted. She is now DNR. Discharge to SNF today. No home hospice because she wishes to continue HD.    CAD  Angiography revealed severe left main and ostial LAD. Case was discussed. She is extremely high risk for PCI and deemed not a candidate for CABG. Palliative as above. Will continue plavix, statin, amlodipine, coreg, and losartan.    Respiratory failure c/b PEA arrest Presented to [facility] with respiratory failure and then PEA arrest while at [facility] requiring brief CPR along with mechanical ventilation.    Anemia of chronic disease Hb stable at discharge: 7.9, near baseline   ESRD on HD Per nephrology.   Please order outpatient palliative care referral at the SNF.   Pt seen and examined by Dr. Angelena Form and felt stable for discharge to SNF.   Did the patient have an acute coronary syndrome (MI, NSTEMI, STEMI, etc) this admission?:  Yes                               AHA/ACC Clinical Performance &  Quality Measures: 1. Aspirin prescribed? - No - no intervention 2. ADP Receptor Inhibitor (Plavix/Clopidogrel, Brilinta/Ticagrelor or Effient/Prasugrel) prescribed (includes medically managed patients)? - Yes 3. Beta Blocker prescribed? - Yes 4. High Intensity Statin (Lipitor 40-80mg  or Crestor 20-40mg ) prescribed? -  Yes 5. EF assessed during THIS hospitalization? - Yes 6. For EF <40%, was ACEI/ARB prescribed? - Yes 7. For EF <40%, Aldosterone Antagonist (Spironolactone or Eplerenone) prescribed? - Not Applicable (EF >/= [id number]) 8. Cardiac Rehab Phase II ordered (Included Medically managed Patients)? - Yes   _____________  Discharge Vitals Blood pressure (!) 139/40, pulse 69, temperature 98.1 F (36.7 C), temperature source Oral, resp. rate 14, height 4\' 10"  (1.473 m), weight 59.3 kg, SpO2 100 %.  Filed Weights   10/16/19 0810 10/16/19 1200 10/17/19 0553  Weight: 60 kg 58.5 kg 59.3 kg    Labs & Radiologic Studies    CBC Recent Labs    10/14/19 1451  10/14/19 1601 10/15/19 0307  WBC 5.6  --   --  7.0  NEUTROABS  --   --   --  5.4  HGB 8.8*   < > 8.5* 7.9*  HCT 27.5*   < > 25.0* 25.0*  MCV 95.2  --   --  95.4  PLT 117*  --   --  137*   < > = values in this interval not displayed.   Basic Metabolic Panel Recent Labs    10/15/19 0307 10/16/19 0247 10/17/19 0245  NA 136 133* 133*  K 4.7 5.1 4.5  CL 96* 91* 94*  CO2 30 30 28   GLUCOSE 137* 87 97  BUN 25* 33* 18  CREATININE 4.94* 6.02* 3.85*  CALCIUM 8.3* 8.8* 8.5*  MG 2.4  --   --    Liver Function Tests Recent Labs    10/15/19 0307  AST 13*  ALT 11  ALKPHOS 46  BILITOT 0.6  PROT 4.7*  ALBUMIN 2.8*   No results for input(s): LIPASE, AMYLASE in the last 72 hours. High Sensitivity Troponin:   Recent Labs  Lab 09/22/19 1238 09/22/19 1853 10/01/19 1650 10/01/19 1836 10/11/19 0626  TROPONINIHS 204* 185* 23* 26* 64*    BNP Invalid input(s): POCBNP D-Dimer No results for input(s): DDIMER in the last 72 hours. Hemoglobin A1C No results for input(s): HGBA1C in the last 72 hours. Fasting Lipid Panel No results for input(s): CHOL, HDL, LDLCALC, TRIG, CHOLHDL, LDLDIRECT in the last 72 hours. Thyroid Function Tests No results for input(s): TSH, T4TOTAL, T3FREE, THYROIDAB in the last 72 hours.  Invalid input(s):  FREET3 _____________  Dg Chest 1 View  Result Date: 10/01/2019 CLINICAL DATA:  Shortness of breath, respiratory distress EXAM: CHEST  1 VIEW COMPARISON:  09/24/2019 FINDINGS: Multifocal interstitial/airspace opacities, lower lobe predominant. This appearance is new/progressive from the prior. No pleural effusion or pneumothorax. The heart is normal in size.  Thoracic aortic atherosclerosis. Degenerative changes of the visualized thoracolumbar spine. IMPRESSION: Multifocal interstitial/airspace opacities, possibly reflecting interstitial edema given the clinical history. No associated pleural effusions. Technically speaking, in the appropriate clinical setting, atypical/viral pneumonia (including COVID) is within the differential. Electronically Signed   By: Julian Hy M.D.   On: 10/01/2019 17:30   Dg Abd 1 View  Result Date: 09/20/2019 CLINICAL DATA:  OG tube placement EXAM: ABDOMEN - 1 VIEW COMPARISON:  None. FINDINGS: OG tube appears adequately positioned in the stomach with proximal sideholes just below the level of the gastroesophageal junction. Visualized bowel gas pattern is nonobstructive. No evidence of free  intraperitoneal air or abnormal fluid collection. Aortic atherosclerosis. IMPRESSION: 1. OG tube appears adequately positioned in the stomach with proximal sideholes just below the level of the gastroesophageal junction. 2. Nonobstructive bowel gas pattern. 3. Aortic atherosclerosis. Electronically Signed   By: Franki Cabot M.D.   On: 09/20/2019 11:52   Ct Chest Wo Contrast  Result Date: 09/19/2019 CLINICAL DATA:  Midsternal chest pain for 2 weeks with shortness of breath. Nonsmoker without history of cancer. EXAM: CT CHEST WITHOUT CONTRAST TECHNIQUE: Multidetector CT imaging of the chest was performed following the standard protocol without IV contrast. COMPARISON:  09/05/2019 chest radiograph. Most recent CT of 09/02/2008 FINDINGS: Cardiovascular: Advanced aortic and branch  vessel atherosclerosis. Tortuous thoracic aorta. Mild cardiomegaly, without pericardial effusion. Multivessel coronary artery atherosclerosis. Aortic valve calcification. Mediastinum/Nodes: Hypoattenuating left thyroid nodule of 1.2 cm, new but nonspecific. No mediastinal or definite hilar adenopathy, given limitations of unenhanced CT. Lungs/Pleura: No pleural fluid.  Clear lungs. Upper Abdomen: Nonspecific caudate lobe enlargement. Similar. Normal imaged portions of the spleen, adrenal glands. Renal atrophy bilaterally. An upper pole left renal exophytic 1.8 cm cyst. Pancreatic tail 1.8 cm lesion on 114/2 is minimally enlarged compared to 2009 where it measured 1.5 cm. Calcification in its inferior portion. There is also a incompletely imaged pancreatic body lesion on 125/2, on the order of 1.8 cm-similar. Suggestion of peripancreatic inflammation, especially about the pancreatic tail lesion on 113/2. Cholecystectomy. Musculoskeletal: Lower thoracic spondylosis. No sternal fracture or retrosternal fluid. IMPRESSION: 1.  No acute process in the chest. 2. Coronary artery atherosclerosis. Aortic Atherosclerosis (ICD10-I70.0). 3. Pancreatic cystic lesions, suboptimally and incompletely evaluated. Suspicion of peripancreatic edema. Correlate with symptoms to suggest acute on chronic pancreatitis. Consider follow-up with dedicated MRI or CT. 4. Chronic caudate lobe prominence, nonspecific. Correlate with any risk factors for cirrhosis. Electronically Signed   By: Abigail Miyamoto M.D.   On: 09/19/2019 12:28   Dg Chest Port 1 View  Result Date: 10/12/2019 CLINICAL DATA:  Respiratory failure EXAM: PORTABLE CHEST 1 VIEW COMPARISON:  10/11/2019, 10/01/2019, 09/24/2019 FINDINGS: Removal of endotracheal tube. Low lung volumes. Patchy focus of atelectasis at the left base. Borderline to mild cardiomegaly. Aortic atherosclerosis. No pneumothorax. Removal of esophageal tube. IMPRESSION: 1. Removal of endotracheal and esophageal  tubes. 2. Low lung volumes with atelectasis at the left base. Mild cardiomegaly. Electronically Signed   By: Donavan Foil M.D.   On: 10/12/2019 01:00   Dg Chest Portable 1 View  Result Date: 10/11/2019 CLINICAL DATA:  Status post CPR. EXAM: PORTABLE CHEST 1 VIEW COMPARISON:  10/01/2019 FINDINGS: 0647 hours. Endotracheal tube tip is 2.4 cm above the base of the carina. Cardiopericardial silhouette is at upper limits of normal for size. There is pulmonary vascular congestion with interstitial prominence suggesting edema. NG tube tip is positioned in the mid stomach. The visualized bony structures of the thorax are intact. Insert wire. IMPRESSION: 1. Endotracheal tube tip is 2.4 cm above the base of the carina. 2. Pulmonary vascular congestion with interstitial edema. Electronically Signed   By: Misty Stanley M.D.   On: 10/11/2019 07:36   Dg Chest Port 1 View  Result Date: 09/24/2019 CLINICAL DATA:  Pulmonary infiltrates. EXAM: PORTABLE CHEST 1 VIEW COMPARISON:  09/22/2019 FINDINGS: Improved appearance of interstitial and heterogeneous opacity seen on prior exams. Cardiomediastinal contours are stable with cardiac enlargement and dense aortic calcification and tortuosity. No signs of effusion or dense consolidation. No acute bone finding. IMPRESSION: 1. Improved appearance of presumed edema, potentially superimposed on chronic lung  disease. 2. Stable dense aortic calcification and tortuosity. Electronically Signed   By: Zetta Bills M.D.   On: 09/24/2019 10:41   Dg Chest Port 1 View  Result Date: 09/22/2019 CLINICAL DATA:  Pulmonary disease EXAM: PORTABLE CHEST 1 VIEW COMPARISON:  09/20/2019 FINDINGS: Bilateral diffuse interstitial thickening. No pleural effusion or pneumothorax. No focal consolidation. Stable cardiomediastinal silhouette. Thoracic aortic atherosclerosis. No acute osseous abnormality. IMPRESSION: Diffuse bilateral interstitial thickening likely reflecting chronic interstitial disease  with possible superimposed interstitial edema or infection. Electronically Signed   By: Kathreen Devoid   On: 09/22/2019 18:04   Dg Chest Port 1 View  Result Date: 09/20/2019 CLINICAL DATA:  Intubation EXAM: PORTABLE CHEST 1 VIEW COMPARISON:  09/20/2019, 5:50 a.m. FINDINGS: Interval placement of endotracheal tube, tip positioned over the lower trachea, approximately 2 cm above the carina. Esophagogastric tube is position with tip and side port below the diaphragm. Mild cardiomegaly. Interval improvement in bilateral heterogeneous and interstitial opacity, most conspicuous in the right midlung. IMPRESSION: 1. Interval placement of endotracheal tube, tip positioned over the lower trachea, approximately 2 cm above the carina. Esophagogastric tube is position with tip and side port below the diaphragm. 2. Interval improvement in bilateral heterogeneous and interstitial opacity, most conspicuous in the right midlung, likely improved edema. Electronically Signed   By: Eddie Candle M.D.   On: 09/20/2019 11:20   Dg Chest Port 1 View  Result Date: 09/20/2019 CLINICAL DATA:  Intubated. Respiratory distress. EXAM: PORTABLE CHEST 1 VIEW COMPARISON:  09/05/2019 FINDINGS: The endotracheal tube tip is at the thoracic inlet. Borderline enlarged cardiac silhouette. Tortuous and calcified thoracic aorta. Extensive patchy bilateral airspace opacity, greater on the right. No pleural fluid. Thoracic spine degenerative changes. IMPRESSION: 1. Endotracheal tube tip at the thoracic inlet. It is recommended that this be advanced 4.3 cm. 2. Extensive patchy bilateral probable pneumonia. 3. Aortic atherosclerosis. Electronically Signed   By: Claudie Revering M.D.   On: 09/20/2019 06:41   Disposition   Pt is being discharged home today in good condition.  Follow-up Plans & Appointments     Contact information for follow-up providers    Minna Merritts, MD Follow up in 1 week(s).   Specialty: Cardiology Why: hospital follow  up Contact information: [address] [place] [address] [phone]        Mamie Levers, MD .   Specialty: Otolaryngology Contact information: [place] [address] [phone]            Contact information for after-discharge care    Destination    [facility] .   Service: Skilled Nursing Contact information: [address] [place] [phone]                 Discharge Instructions    ([place]) Call MD:  Anytime you have any of the following symptoms: 1) 3 pound weight gain in 24 hours or 5 pounds in 1 week 2) shortness of breath, with or without a dry hacking cough 3) swelling in the hands, feet or stomach 4) if you have to sleep on extra pillows at night in order to breathe.   Complete by: As directed    Daily weights   Complete by: As directed    Diet - low sodium heart healthy   Complete by: As directed    Discharge instructions   Complete by: As directed    No lifting  over 5 lbs for 1 week. No sexual activity for 1 week. Keep procedure site clean & dry. If you notice increased pain, swelling, bleeding or pus, call/return!  You may shower, but no soaking baths/hot tubs/pools for 1 week.   Increase activity slowly   Complete by: As directed       Discharge Medications   Allergies as of 10/17/2019      Reactions   Enalapril Maleate Other (See Comments)   Other reaction(s): Headache   Nitrofurantoin Swelling, Rash   Other Reaction: swelling of body   Sulfamethoxazole-trimethoprim Swelling   2,4-d Dimethylamine (amisol) Rash, Other (See Comments)   Other Reaction: h/a   Baclofen Other (See Comments), Nausea Only   lightheadness ,drowsiness , muscle weakness , twitching in hands    Neosporin [neomycin-bacitracin Zn-polymyx] Other (See Comments), Rash   Other Reaction: irritation Skin irritation   Quinine Nausea And Vomiting,  Rash, Other (See Comments)   Other Reaction: Vomiting, rash, h/a, vision   Ultram [tramadol] Palpitations   Zocor [simvastatin] Other (See Comments), Rash   Other Reaction: muscle spasms Muscle pain and spasms   Bactrim [sulfamethoxazole-trimethoprim] Swelling   Levodopa Other (See Comments)   Reaction: unknown   Macrodantin [nitrofurantoin Macrocrystal] Swelling   Quinine Derivatives Other (See Comments)   Vertigo,nausea vomiting blurred vision headache ears sensitivity      Medication List    STOP taking these medications   omeprazole 20 MG capsule Commonly known as: PRILOSEC     TAKE these medications   acetaminophen 500 MG tablet Commonly known as: TYLENOL Take 1 tablet (500 mg total) by mouth every 6 (six) hours as needed for mild pain or fever.   albuterol 108 (90 Base) MCG/ACT inhaler Commonly known as: VENTOLIN HFA Inhale 2 puffs into the lungs every 6 (six) hours as needed for wheezing or shortness of breath.   amLODipine 10 MG tablet Commonly known as: NORVASC Take 10 mg by mouth daily.   benzonatate 100 MG capsule Commonly known as: TESSALON Take 1 capsule (100 mg total) by mouth 2 (two) times daily as needed for cough.   calcium acetate 667 MG capsule Commonly known as: PHOSLO Take 1,334 mg by mouth 3 (three) times daily with meals.   carvedilol 12.5 MG tablet Commonly known as: COREG Take 12.5 mg by mouth 2 (two) times daily with a meal.   cetirizine 10 MG tablet Commonly known as: ZYRTEC Take 10 mg by mouth daily as needed for allergies.   clopidogrel 75 MG tablet Commonly known as: PLAVIX Take 1 tablet (75 mg total) by mouth daily.   Fluticasone-Salmeterol 250-50 MCG/DOSE Aepb Commonly known as: ADVAIR Inhale 1 puff into the lungs 2 (two) times daily as needed (for shortness of breath).   hydrALAZINE 50 MG tablet Commonly known as: APRESOLINE Take 1 tablet (50 mg total) by mouth every 8 (eight) hours.   HYDROcodone-acetaminophen 7.5-325 MG  tablet Commonly known as: NORCO Take 1 tablet by mouth every 6 (six) hours as needed (pain).   ipratropium-albuterol 0.5-2.5 (3) MG/3ML Soln Commonly known as: DUONEB Take 3 mLs by nebulization every 6 (six) hours as needed.   lidocaine 5 % Commonly known as: LIDODERM Place 1 patch onto the skin daily. Remove & Discard patch within 12 hours or as directed by MD   lidocaine-prilocaine cream Commonly known as: EMLA Apply 1 application topically as needed (prior to treatment).   losartan 50 MG tablet Commonly known as: COZAAR Take 50 mg by mouth daily.  ondansetron 4 MG tablet Commonly known as: ZOFRAN Take 4 mg by mouth every 8 (eight) hours as needed.   oxybutynin 15 MG 24 hr tablet Commonly known as: DITROPAN XL Take 15 mg by mouth daily.   pantoprazole 40 MG tablet Commonly known as: Protonix Take 1 tablet (40 mg total) by mouth daily.   pravastatin 40 MG tablet Commonly known as: PRAVACHOL Take 40 mg by mouth at bedtime.   scopolamine 1 MG/3DAYS Commonly known as: TRANSDERM-SCOP Place 1 patch onto the skin every 3 (three) days.   topiramate 25 MG tablet Commonly known as: TOPAMAX Take 1 tablet (25 mg total) by mouth as needed (for headaches).   torsemide 20 MG tablet Commonly known as: DEMADEX Take 2 tablets (40 mg total) by mouth every Monday,Wednesday,Friday, and Sunday at 6 PM.          Outstanding Labs/Studies     Duration of Discharge Encounter   Greater than 30 minutes including physician time.  Signed, Tami Lin Johniece Hornbaker, PA 10/17/2019, 9:34 AM

## 2019-10-17 NOTE — Progress Notes (Signed)
Patient discharged to [facility], IV removed and intact, IV site clean and dry. Discharge instructions given to patient all questions were answered.

## 2019-10-17 NOTE — Progress Notes (Signed)
Report given to patients nurse Lennette Bihari at [facility].

## 2019-10-20 ENCOUNTER — Institutional Professional Consult (permissible substitution): Payer: Medicare Other | Admitting: Cardiovascular Disease

## 2019-10-21 ENCOUNTER — Ambulatory Visit: Payer: Medicare Other | Admitting: Physician Assistant

## 2019-10-21 ENCOUNTER — Emergency Department
Admission: EM | Admit: 2019-10-21 | Discharge: 2019-10-21 | Disposition: A | Discharge status: Skilled Nursing Facility | Payer: Medicare Other | Source: Home / Self Care | Attending: Emergency Medicine | Admitting: Emergency Medicine

## 2019-10-21 ENCOUNTER — Other Ambulatory Visit: Payer: Self-pay

## 2019-10-21 ENCOUNTER — Emergency Department: Payer: Medicare Other

## 2019-10-21 DIAGNOSIS — E119 Type 2 diabetes mellitus without complications: Secondary | ICD-10-CM | POA: Insufficient documentation

## 2019-10-21 DIAGNOSIS — Z992 Dependence on renal dialysis: Secondary | ICD-10-CM | POA: Insufficient documentation

## 2019-10-21 DIAGNOSIS — N186 End stage renal disease: Secondary | ICD-10-CM | POA: Insufficient documentation

## 2019-10-21 DIAGNOSIS — I5032 Chronic diastolic (congestive) heart failure: Secondary | ICD-10-CM | POA: Insufficient documentation

## 2019-10-21 DIAGNOSIS — J9621 Acute and chronic respiratory failure with hypoxia: Secondary | ICD-10-CM | POA: Diagnosis not present

## 2019-10-21 DIAGNOSIS — R0600 Dyspnea, unspecified: Secondary | ICD-10-CM

## 2019-10-21 DIAGNOSIS — J45909 Unspecified asthma, uncomplicated: Secondary | ICD-10-CM | POA: Insufficient documentation

## 2019-10-21 DIAGNOSIS — R0602 Shortness of breath: Secondary | ICD-10-CM | POA: Insufficient documentation

## 2019-10-21 DIAGNOSIS — I132 Hypertensive heart and chronic kidney disease with heart failure and with stage 5 chronic kidney disease, or end stage renal disease: Secondary | ICD-10-CM | POA: Insufficient documentation

## 2019-10-21 DIAGNOSIS — J449 Chronic obstructive pulmonary disease, unspecified: Secondary | ICD-10-CM | POA: Insufficient documentation

## 2019-10-21 LAB — CBC WITH DIFFERENTIAL/PLATELET
Abs Immature Granulocytes: 0.05 10*3/uL (ref 0.00–0.07)
Basophils Absolute: 0 10*3/uL (ref 0.0–0.1)
Basophils Relative: 1 %
Eosinophils Absolute: 0.3 10*3/uL (ref 0.0–0.5)
Eosinophils Relative: 4 %
HCT: 25.2 % — ABNORMAL LOW (ref 36.0–46.0)
Hemoglobin: 8.2 g/dL — ABNORMAL LOW (ref 12.0–15.0)
Immature Granulocytes: 1 %
Lymphocytes Relative: 12 %
Lymphs Abs: 1 10*3/uL (ref 0.7–4.0)
MCH: 29.6 pg (ref 26.0–34.0)
MCHC: 32.5 g/dL (ref 30.0–36.0)
MCV: 91 fL (ref 80.0–100.0)
Monocytes Absolute: 0.6 10*3/uL (ref 0.1–1.0)
Monocytes Relative: 8 %
Neutro Abs: 6 10*3/uL (ref 1.7–7.7)
Neutrophils Relative %: 74 %
Platelets: 227 10*3/uL (ref 150–400)
RBC: 2.77 MIL/uL — ABNORMAL LOW (ref 3.87–5.11)
RDW: 14.2 % (ref 11.5–15.5)
WBC: 7.9 10*3/uL (ref 4.0–10.5)
nRBC: 0 % (ref 0.0–0.2)

## 2019-10-21 LAB — COMPREHENSIVE METABOLIC PANEL
ALT: 8 U/L (ref 0–44)
AST: 15 U/L (ref 15–41)
Albumin: 3.7 g/dL (ref 3.5–5.0)
Alkaline Phosphatase: 74 U/L (ref 38–126)
Anion gap: 14 (ref 5–15)
BUN: 35 mg/dL — ABNORMAL HIGH (ref 8–23)
CO2: 26 mmol/L (ref 22–32)
Calcium: 9.1 mg/dL (ref 8.9–10.3)
Chloride: 95 mmol/L — ABNORMAL LOW (ref 98–111)
Creatinine, Ser: 6.48 mg/dL — ABNORMAL HIGH (ref 0.44–1.00)
GFR calc Af Amer: 7 mL/min — ABNORMAL LOW (ref >60)
GFR calc non Af Amer: 6 mL/min — ABNORMAL LOW (ref >60)
Glucose, Bld: 107 mg/dL — ABNORMAL HIGH (ref 70–99)
Potassium: 4.5 mmol/L (ref 3.5–5.1)
Sodium: 135 mmol/L (ref 135–145)
Total Bilirubin: 0.9 mg/dL (ref 0.3–1.2)
Total Protein: 6.2 g/dL — ABNORMAL LOW (ref 6.5–8.1)

## 2019-10-21 LAB — TROPONIN I (HIGH SENSITIVITY): Troponin I (High Sensitivity): 58 ng/L — ABNORMAL HIGH (ref <18)

## 2019-10-21 LAB — LIPASE, BLOOD: Lipase: 20 U/L (ref 11–51)

## 2019-10-21 LAB — MAGNESIUM: Magnesium: 2.2 mg/dL (ref 1.7–2.4)

## 2019-10-21 NOTE — ED Notes (Signed)
Estill Bamberg from dialysis called this RN to inform this RN that Dr. Abigail Butts stated pt should go to her dialysis today. Estill Bamberg asked this RN to see if EMS could transport pt to dialysis on [place] ([facility]) by 11:45. This RN informed Estill Bamberg that we would contact EMS to see if that's possible. Estill Bamberg can be reached at [phone]. Informed Charge RN, Marya Amsler, about this.

## 2019-10-21 NOTE — ED Notes (Signed)
X-ray at bedside

## 2019-10-21 NOTE — ED Notes (Signed)
EDP at bedside. No resp distress noted. Pt speaking in complete sentences.

## 2019-10-21 NOTE — Progress Notes (Addendum)
Hemodialysis patient known at [facility] TTS 11:45. Stays at [facility] for rehab, who transports to treatments. Per Dr. Juleen China patient is medically cleared and will need to get to her outpatient dialysis treatment. Plan is that [facility] will pick up patient from the ED and take her to treatment. Dr. Juleen China and Anda Kraft RN aware.   Elvera Bicker  Dialysis Coordinator [phone]

## 2019-10-21 NOTE — ED Provider Notes (Signed)
[facility] Emergency Department Provider Note       Time seen: ----------------------------------------- 7:09 AM on 10/21/2019 -----------------------------------------   I have reviewed the triage vital signs and the nursing notes.  HISTORY   Chief Complaint Shortness of Breath    HPI Phyllis [name] is a 73 y.o. female with a history of heart failure, allergies, anemia, COPD, diabetes, end-stage renal disease on dialysis who is due for dialysis this morning who presents to the ED for shortness of breath.  Patient is from [facility], called out this morning due to shortness of breath.  She states her chest is sore from recent chest compressions when she had CPR last week.  She is on 3 L nasal cannula oxygen chronically.  She states she was short of breath earlier and received a breathing treatment but this did not help.  Past Medical History:  Diagnosis Date  . (HFpEF) heart failure with preserved ejection fraction ([place])    a. TTE 01/2014: nl LV sys fxn, no valvular abnormalities; b. TTE 11/16: nl EF, mild LVH;  c. 04/2019 Echo: EF 60-65%. DD. Nl RV fxn. Mod AS. 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  . Allergy   . Anemia of chronic disease   . Anxiety   . Aortic atherosclerosis ([place])   . Asthma   . Chronic back pain   . COPD (chronic obstructive pulmonary disease) ([place])   . Diabetes mellitus with complication ([place])   . ESRD on hemodialysis ([place])    a. Tues/Sat; b. 2/2 small kidneys  . Essential hypertension   . Fistula    lower left arm  . GERD (gastroesophageal reflux disease)   . Gout   . History of exercise stress test    a. 01/2014: no evidence of ischemia; b. Lexiscan 08/2015: no sig ischemia, severe GI uptake artifact, low risk; c. CPET @ [facility] 09/2016: exercised 3 min 12 sec on bike without incline, 2.28 METs, VO2 of 8.1, 48% of predicted, indicating mod to sev functional impairment, evidence of blunted HR, stroke volume,  and BP augmentation as well as ventilation-perfusion mismatch with exercise  . HLD (hyperlipidemia)   . Non-obstructive Carotid arterial disease ([place])    a. 12/2017: <50% bilat ICA dzs.  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 Severe aortic stenosis    a. by 09/2019 echo b. 04/2019 Echo: Mod AS.  Marland Kitchen Sleep apnea     Patient Active Problem List   Diagnosis Date Noted  . Palliative care by specialist   . Goals of care, counseling/discussion   . Flash pulmonary edema ([place]) 10/14/2019  . Respiratory failure with hypoxia ([place]) 10/11/2019  . Anemia   . Acute on chronic respiratory failure with hypoxia ([place]) 10/01/2019  . Severe aortic valve stenosis   . Acute respiratory failure with hypoxemia ([place]) 09/20/2019  . COPD with acute bronchitis ([place]) 09/05/2019  . Congestive heart failure ([place]) 05/24/2019  . Hyperkalemia 11/29/2018  . Chronic diastolic heart failure ([place]) 11/02/2018  . HTN (hypertension) 11/02/2018  . Uncontrolled hypertension 10/21/2018  . History of acute pulmonary edema 10/21/2018  . Pressure injury of skin 03/16/2018  . CVA (cerebral vascular accident) ([place]) 01/16/2018  . Aortic atherosclerosis ([place]) 12/11/2017  . Chest pain 09/18/2017  . Hyperlipidemia 08/06/2015  . Complication from renal dialysis device 04/12/2015  . SOB (shortness of breath) 02/01/2015  . End stage renal disease on dialysis ([place]) 02/01/2015  . Type 2 diabetes  mellitus with other specified complication ([place]) [id number]  . Asthma 02/01/2015  . Acute on chronic diastolic CHF (congestive heart failure) ([place]) 02/01/2015    Past Surgical History:  Procedure Laterality Date  . carpel tunnel    . GALLBLADDER SURGERY    . PERIPHERAL VASCULAR CATHETERIZATION N/A 04/12/2015   Procedure: A/V Shuntogram/Fistulagram;  Surgeon: Algernon Huxley, MD;  Location: [place] [facility] LAB;  Service: Cardiovascular;  Laterality: N/A;  . PERIPHERAL VASCULAR CATHETERIZATION N/A 04/12/2015   Procedure:  A/V Shunt Intervention;  Surgeon: Algernon Huxley, MD;  Location: [place] [facility] LAB;  Service: Cardiovascular;  Laterality: N/A;  . PERIPHERAL VASCULAR CATHETERIZATION N/A 06/09/2015   Procedure: Dialysis/Perma Catheter Removal;  Surgeon: Katha Cabal, MD;  Location: [place] CV LAB;  Service: Cardiovascular;  Laterality: N/A;  . RIGHT/LEFT HEART CATH AND CORONARY ANGIOGRAPHY N/A 10/14/2019   Procedure: RIGHT/LEFT HEART CATH AND CORONARY ANGIOGRAPHY;  Surgeon: Belva Crome, MD;  Location: [place] [facility] LAB;  Service: Cardiovascular;  Laterality: N/A;    Allergies Enalapril maleate; Nitrofurantoin; Sulfamethoxazole-trimethoprim; 2,4-d dimethylamine (amisol); Baclofen; Neosporin [neomycin-bacitracin zn-polymyx]; Quinine; Ultram [tramadol]; Zocor [simvastatin]; Bactrim [sulfamethoxazole-trimethoprim]; Levodopa; Macrodantin [nitrofurantoin macrocrystal]; and Quinine derivatives  Social History Social History   Tobacco Use  . Smoking status: Never Smoker  . Smokeless tobacco: Never Used  Substance Use Topics  . Alcohol use: No  . Drug use: No    Review of Systems Constitutional: Negative for fever. Cardiovascular: Negative for chest pain. Respiratory: Positive for shortness of breath Gastrointestinal: Negative for abdominal pain, vomiting and diarrhea. Musculoskeletal: Negative for back pain. Skin: Negative for rash. Neurological: Negative for headaches, focal weakness or numbness.  All systems negative/normal/unremarkable except as stated in the HPI  ____________________________________________   PHYSICAL EXAM:  VITAL SIGNS: ED Triage Vitals  Enc Vitals Group     BP 10/21/19 0635 (!) 174/55     Pulse Rate 10/21/19 0635 79     Resp 10/21/19 0635 14     Temp 10/21/19 0635 98.9 F (37.2 C)     Temp Source 10/21/19 0635 Oral     SpO2 10/21/19 0635 100 %     Weight 10/21/19 0636 130 lb (59 kg)     Height 10/21/19 0636 4\' 10"  (1.473 m)     Head Circumference --       Peak Flow --      Pain Score 10/21/19 0636 7     Pain Loc --      Pain Edu? --      Excl. in [place]? --    Constitutional: Alert and oriented. Well appearing and in no distress. Eyes: Conjunctivae are normal. Normal extraocular movements. Cardiovascular: Normal rate, regular rhythm.  Loud systolic murmur Respiratory: Normal respiratory effort without tachypnea nor retractions. Breath sounds are mostly clear bilaterally Gastrointestinal: Soft and nontender. Normal bowel sounds Musculoskeletal: Nontender with normal range of motion in extremities. No lower extremity tenderness nor edema. Neurologic:  Normal speech and language. No gross focal neurologic deficits are appreciated.  Skin:  Skin is warm, dry and intact. No rash noted. Psychiatric: Depressed mood ____________________________________________  EKG: Interpreted by me.  Sinus rhythm the rate of 79 bpm, normal PR interval, normal QRS, normal QT  ____________________________________________  ED COURSE:  As part of my medical decision making, I reviewed the following data within the [place] History obtained from family if available, nursing notes, old chart and ekg, as well as notes from prior ED visits. Patient presented for dyspnea,  we will assess with labs and imaging as indicated at this time.   Procedures  Modine Luis Charrette was evaluated in Emergency Department on 10/21/2019 for the symptoms described in the history of present illness. She was evaluated in the context of the global COVID-19 pandemic, which necessitated consideration that the patient might be at risk for infection with the SARS-CoV-2 virus that causes COVID-19. Institutional protocols and algorithms that pertain to the evaluation of patients at risk for COVID-19 are in a state of rapid change based on information released by regulatory bodies including the CDC and federal and state organizations. These policies and algorithms were followed during  the patient's care in the ED.  ____________________________________________   LABS (pertinent positives/negatives)  Labs Reviewed  CBC WITH DIFFERENTIAL/PLATELET - Abnormal; Notable for the following components:      Result Value   RBC 2.77 (*)    Hemoglobin 8.2 (*)    HCT 25.2 (*)    All other components within normal limits  COMPREHENSIVE METABOLIC PANEL - Abnormal; Notable for the following components:   Chloride 95 (*)    Glucose, Bld 107 (*)    BUN 35 (*)    Creatinine, Ser 6.48 (*)    Total Protein 6.2 (*)    GFR calc non Af Amer 6 (*)    GFR calc Af Amer 7 (*)    All other components within normal limits  TROPONIN I (HIGH SENSITIVITY) - Abnormal; Notable for the following components:   Troponin I (High Sensitivity) 58 (*)    All other components within normal limits  MAGNESIUM  LIPASE, BLOOD    RADIOLOGY Images were viewed by me  Chest x-ray IMPRESSION:  Mildly increased perihilar and bibasilar interstitial opacities  compared to prior. Findings may reflect interstitial edema with  atelectasis and/or infection.  ____________________________________________   DIFFERENTIAL DIAGNOSIS   CHF, COPD, pneumonia, pneumothorax, PE, end-stage renal disease  FINAL ASSESSMENT AND PLAN  Dyspnea, end-stage renal disease on dialysis   Plan: The patient had presented for dyspnea. Patient's labs do not indicate any acute process.  Labs and vital signs as well as oxygen requirement are at her baseline.  Patient's imaging x-rays likely represent some interstitial edema because she is due for dialysis this morning.  She appears cleared for outpatient follow-up at this time.   Laurence Aly, MD    Note: This note was generated in part or whole with voice recognition software. Voice recognition is usually quite accurate but there are transcription errors that can and very often do occur. I apologize for any typographical errors that were not detected and corrected.      Earleen Newport, MD 10/21/19 [phone]

## 2019-10-21 NOTE — ED Triage Notes (Signed)
PT to ED via EMS from [facility]. Called out this morning d/t SOB. PT states she has CP but that is stemming from CPR at last visit. PT A/O, on 3L [place] chronically. PT has had 324 aspirin via EMS

## 2019-10-21 NOTE — ED Notes (Signed)
Phyllis [name] from dialysis called this RN back and stated that [facility] is not available to come pick pt up from ED and take pt to dialysis. Informed Charge RN Marya Amsler and pt about plan.

## 2019-10-23 ENCOUNTER — Encounter: Payer: Self-pay | Admitting: *Deleted

## 2019-10-23 ENCOUNTER — Inpatient Hospital Stay
Admission: EM | Admit: 2019-10-23 | Discharge: 2019-10-26 | DRG: 189 | Disposition: A | Discharge status: Skilled Nursing Facility | Payer: Medicare Other | Attending: Internal Medicine | Admitting: Internal Medicine

## 2019-10-23 ENCOUNTER — Emergency Department: Payer: Medicare Other

## 2019-10-23 ENCOUNTER — Other Ambulatory Visit: Payer: Self-pay

## 2019-10-23 DIAGNOSIS — I350 Nonrheumatic aortic (valve) stenosis: Secondary | ICD-10-CM | POA: Diagnosis present

## 2019-10-23 DIAGNOSIS — E785 Hyperlipidemia, unspecified: Secondary | ICD-10-CM | POA: Diagnosis present

## 2019-10-23 DIAGNOSIS — Z8249 Family history of ischemic heart disease and other diseases of the circulatory system: Secondary | ICD-10-CM

## 2019-10-23 DIAGNOSIS — I2510 Atherosclerotic heart disease of native coronary artery without angina pectoris: Secondary | ICD-10-CM | POA: Diagnosis present

## 2019-10-23 DIAGNOSIS — K219 Gastro-esophageal reflux disease without esophagitis: Secondary | ICD-10-CM | POA: Diagnosis present

## 2019-10-23 DIAGNOSIS — J9600 Acute respiratory failure, unspecified whether with hypoxia or hypercapnia: Secondary | ICD-10-CM | POA: Insufficient documentation

## 2019-10-23 DIAGNOSIS — J9621 Acute and chronic respiratory failure with hypoxia: Secondary | ICD-10-CM | POA: Diagnosis present

## 2019-10-23 DIAGNOSIS — Z8673 Personal history of transient ischemic attack (TIA), and cerebral infarction without residual deficits: Secondary | ICD-10-CM | POA: Diagnosis not present

## 2019-10-23 DIAGNOSIS — I700 Atherosclerosis of aorta: Secondary | ICD-10-CM | POA: Diagnosis present

## 2019-10-23 DIAGNOSIS — E669 Obesity, unspecified: Secondary | ICD-10-CM | POA: Diagnosis present

## 2019-10-23 DIAGNOSIS — Z992 Dependence on renal dialysis: Secondary | ICD-10-CM

## 2019-10-23 DIAGNOSIS — F419 Anxiety disorder, unspecified: Secondary | ICD-10-CM | POA: Diagnosis present

## 2019-10-23 DIAGNOSIS — N186 End stage renal disease: Secondary | ICD-10-CM

## 2019-10-23 DIAGNOSIS — Z8349 Family history of other endocrine, nutritional and metabolic diseases: Secondary | ICD-10-CM

## 2019-10-23 DIAGNOSIS — E1122 Type 2 diabetes mellitus with diabetic chronic kidney disease: Secondary | ICD-10-CM | POA: Diagnosis present

## 2019-10-23 DIAGNOSIS — M109 Gout, unspecified: Secondary | ICD-10-CM | POA: Diagnosis present

## 2019-10-23 DIAGNOSIS — G4733 Obstructive sleep apnea (adult) (pediatric): Secondary | ICD-10-CM | POA: Diagnosis present

## 2019-10-23 DIAGNOSIS — R778 Other specified abnormalities of plasma proteins: Secondary | ICD-10-CM

## 2019-10-23 DIAGNOSIS — J441 Chronic obstructive pulmonary disease with (acute) exacerbation: Secondary | ICD-10-CM | POA: Diagnosis present

## 2019-10-23 DIAGNOSIS — Z9981 Dependence on supplemental oxygen: Secondary | ICD-10-CM

## 2019-10-23 DIAGNOSIS — I639 Cerebral infarction, unspecified: Secondary | ICD-10-CM | POA: Diagnosis present

## 2019-10-23 DIAGNOSIS — Z20828 Contact with and (suspected) exposure to other viral communicable diseases: Secondary | ICD-10-CM | POA: Diagnosis present

## 2019-10-23 DIAGNOSIS — N2581 Secondary hyperparathyroidism of renal origin: Secondary | ICD-10-CM | POA: Diagnosis present

## 2019-10-23 DIAGNOSIS — Z66 Do not resuscitate: Secondary | ICD-10-CM | POA: Diagnosis present

## 2019-10-23 DIAGNOSIS — Z8674 Personal history of sudden cardiac arrest: Secondary | ICD-10-CM

## 2019-10-23 DIAGNOSIS — J302 Other seasonal allergic rhinitis: Secondary | ICD-10-CM | POA: Diagnosis present

## 2019-10-23 DIAGNOSIS — R0603 Acute respiratory distress: Secondary | ICD-10-CM

## 2019-10-23 DIAGNOSIS — Z79899 Other long term (current) drug therapy: Secondary | ICD-10-CM

## 2019-10-23 DIAGNOSIS — Z7902 Long term (current) use of antithrombotics/antiplatelets: Secondary | ICD-10-CM

## 2019-10-23 DIAGNOSIS — I5032 Chronic diastolic (congestive) heart failure: Secondary | ICD-10-CM | POA: Diagnosis present

## 2019-10-23 DIAGNOSIS — I132 Hypertensive heart and chronic kidney disease with heart failure and with stage 5 chronic kidney disease, or end stage renal disease: Secondary | ICD-10-CM | POA: Diagnosis present

## 2019-10-23 DIAGNOSIS — D72829 Elevated white blood cell count, unspecified: Secondary | ICD-10-CM | POA: Diagnosis present

## 2019-10-23 DIAGNOSIS — Z883 Allergy status to other anti-infective agents status: Secondary | ICD-10-CM

## 2019-10-23 DIAGNOSIS — I10 Essential (primary) hypertension: Secondary | ICD-10-CM | POA: Diagnosis not present

## 2019-10-23 DIAGNOSIS — Z888 Allergy status to other drugs, medicaments and biological substances status: Secondary | ICD-10-CM

## 2019-10-23 DIAGNOSIS — Z885 Allergy status to narcotic agent status: Secondary | ICD-10-CM

## 2019-10-23 DIAGNOSIS — M940 Chondrocostal junction syndrome [Tietze]: Secondary | ICD-10-CM | POA: Diagnosis present

## 2019-10-23 DIAGNOSIS — J810 Acute pulmonary edema: Secondary | ICD-10-CM

## 2019-10-23 DIAGNOSIS — D631 Anemia in chronic kidney disease: Secondary | ICD-10-CM | POA: Diagnosis present

## 2019-10-23 LAB — COMPREHENSIVE METABOLIC PANEL
ALT: 8 U/L (ref 0–44)
AST: 20 U/L (ref 15–41)
Albumin: 3.4 g/dL — ABNORMAL LOW (ref 3.5–5.0)
Alkaline Phosphatase: 79 U/L (ref 38–126)
Anion gap: 14 (ref 5–15)
BUN: 34 mg/dL — ABNORMAL HIGH (ref 8–23)
CO2: 26 mmol/L (ref 22–32)
Calcium: 8.8 mg/dL — ABNORMAL LOW (ref 8.9–10.3)
Chloride: 94 mmol/L — ABNORMAL LOW (ref 98–111)
Creatinine, Ser: 6 mg/dL — ABNORMAL HIGH (ref 0.44–1.00)
GFR calc Af Amer: 7 mL/min — ABNORMAL LOW (ref >60)
GFR calc non Af Amer: 6 mL/min — ABNORMAL LOW (ref >60)
Glucose, Bld: 148 mg/dL — ABNORMAL HIGH (ref 70–99)
Potassium: 4.1 mmol/L (ref 3.5–5.1)
Sodium: 134 mmol/L — ABNORMAL LOW (ref 135–145)
Total Bilirubin: 0.8 mg/dL (ref 0.3–1.2)
Total Protein: 6 g/dL — ABNORMAL LOW (ref 6.5–8.1)

## 2019-10-23 LAB — CBC
HCT: 23.8 % — ABNORMAL LOW (ref 36.0–46.0)
Hemoglobin: 7.5 g/dL — ABNORMAL LOW (ref 12.0–15.0)
MCH: 30 pg (ref 26.0–34.0)
MCHC: 31.5 g/dL (ref 30.0–36.0)
MCV: 95.2 fL (ref 80.0–100.0)
Platelets: 189 10*3/uL (ref 150–400)
RBC: 2.5 MIL/uL — ABNORMAL LOW (ref 3.87–5.11)
RDW: 14.5 % (ref 11.5–15.5)
WBC: 12.1 10*3/uL — ABNORMAL HIGH (ref 4.0–10.5)
nRBC: 0 % (ref 0.0–0.2)

## 2019-10-23 LAB — BLOOD GAS, ARTERIAL
Acid-Base Excess: 5 mmol/L — ABNORMAL HIGH (ref 0.0–2.0)
Bicarbonate: 31 mmol/L — ABNORMAL HIGH (ref 20.0–28.0)
Delivery systems: POSITIVE
Expiratory PAP: 6
FIO2: 0.4
Inspiratory PAP: 12
O2 Saturation: 98.1 %
Patient temperature: 37
pCO2 arterial: 50 mmHg — ABNORMAL HIGH (ref 32.0–48.0)
pH, Arterial: 7.4 (ref 7.350–7.450)
pO2, Arterial: 107 mmHg (ref 83.0–108.0)

## 2019-10-23 LAB — CBC WITH DIFFERENTIAL/PLATELET
Abs Immature Granulocytes: 0.12 10*3/uL — ABNORMAL HIGH (ref 0.00–0.07)
Basophils Absolute: 0 10*3/uL (ref 0.0–0.1)
Basophils Relative: 0 %
Eosinophils Absolute: 0.4 10*3/uL (ref 0.0–0.5)
Eosinophils Relative: 3 %
HCT: 25.5 % — ABNORMAL LOW (ref 36.0–46.0)
Hemoglobin: 8 g/dL — ABNORMAL LOW (ref 12.0–15.0)
Immature Granulocytes: 1 %
Lymphocytes Relative: 7 %
Lymphs Abs: 1 10*3/uL (ref 0.7–4.0)
MCH: 30.3 pg (ref 26.0–34.0)
MCHC: 31.4 g/dL (ref 30.0–36.0)
MCV: 96.6 fL (ref 80.0–100.0)
Monocytes Absolute: 0.7 10*3/uL (ref 0.1–1.0)
Monocytes Relative: 5 %
Neutro Abs: 11.8 10*3/uL — ABNORMAL HIGH (ref 1.7–7.7)
Neutrophils Relative %: 84 %
Platelets: 265 10*3/uL (ref 150–400)
RBC: 2.64 MIL/uL — ABNORMAL LOW (ref 3.87–5.11)
RDW: 14.6 % (ref 11.5–15.5)
WBC: 14 10*3/uL — ABNORMAL HIGH (ref 4.0–10.5)
nRBC: 0 % (ref 0.0–0.2)

## 2019-10-23 LAB — SARS CORONAVIRUS 2 BY RT PCR (HOSPITAL ORDER, PERFORMED IN ~~LOC~~ HOSPITAL LAB): SARS Coronavirus 2: NEGATIVE

## 2019-10-23 LAB — TROPONIN I (HIGH SENSITIVITY)
Troponin I (High Sensitivity): 31 ng/L — ABNORMAL HIGH (ref <18)
Troponin I (High Sensitivity): 48 ng/L — ABNORMAL HIGH (ref <18)

## 2019-10-23 LAB — HIV ANTIBODY (ROUTINE TESTING W REFLEX): HIV Screen 4th Generation wRfx: NONREACTIVE

## 2019-10-23 LAB — BRAIN NATRIURETIC PEPTIDE: B Natriuretic Peptide: 1573 pg/mL — ABNORMAL HIGH (ref 0.0–100.0)

## 2019-10-23 MED ORDER — MOMETASONE FURO-FORMOTEROL FUM 200-5 MCG/ACT IN AERO
2.0000 | INHALATION_SPRAY | Freq: Two times a day (BID) | RESPIRATORY_TRACT | Status: DC
  Administered 2019-10-23 – 2019-10-25 (×3): 2 via RESPIRATORY_TRACT
  Filled 2019-10-23 (×2): qty 8.8

## 2019-10-23 MED ORDER — DEXTROMETHORPHAN POLISTIREX ER 30 MG/5ML PO SUER
30.0000 mg | Freq: Two times a day (BID) | ORAL | Status: DC
  Administered 2019-10-23 – 2019-10-25 (×6): 30 mg via ORAL
  Filled 2019-10-23 (×7): qty 5

## 2019-10-23 MED ORDER — LEVALBUTEROL HCL 1.25 MG/0.5ML IN NEBU
1.2500 mg | INHALATION_SOLUTION | Freq: Four times a day (QID) | RESPIRATORY_TRACT | Status: DC
  Administered 2019-10-23 – 2019-10-24 (×3): 1.25 mg via RESPIRATORY_TRACT
  Filled 2019-10-23 (×7): qty 0.5

## 2019-10-23 MED ORDER — ONDANSETRON HCL 4 MG/2ML IJ SOLN: 4.0000 mg | Freq: Three times a day (TID) | INTRAMUSCULAR | Status: DC | PRN

## 2019-10-23 MED ORDER — IPRATROPIUM BROMIDE 0.02 % IN SOLN
0.5000 mg | RESPIRATORY_TRACT | Status: DC
  Administered 2019-10-23 (×2): 0.5 mg via RESPIRATORY_TRACT
  Filled 2019-10-23 (×3): qty 2.5

## 2019-10-23 MED ORDER — IPRATROPIUM BROMIDE 0.02 % IN SOLN
0.5000 mg | Freq: Four times a day (QID) | RESPIRATORY_TRACT | Status: DC
  Administered 2019-10-24: 0.5 mg via RESPIRATORY_TRACT
  Filled 2019-10-23: qty 2.5

## 2019-10-23 MED ORDER — LOSARTAN POTASSIUM 50 MG PO TABS
50.0000 mg | ORAL_TABLET | Freq: Every day | ORAL | Status: DC
  Administered 2019-10-23 – 2019-10-24 (×2): 50 mg via ORAL
  Filled 2019-10-23 (×2): qty 1

## 2019-10-23 MED ORDER — EPOETIN ALFA 10000 UNIT/ML IJ SOLN
10000.0000 [IU] | INTRAMUSCULAR | Status: DC
  Administered 2019-10-23: 10000 [IU] via INTRAVENOUS
  Filled 2019-10-23: qty 1

## 2019-10-23 MED ORDER — HYDROCODONE-ACETAMINOPHEN 7.5-325 MG PO TABS
1.0000 | ORAL_TABLET | Freq: Four times a day (QID) | ORAL | Status: DC | PRN
  Administered 2019-10-24 – 2019-10-25 (×4): 1 via ORAL
  Filled 2019-10-23 (×5): qty 1

## 2019-10-23 MED ORDER — TOPIRAMATE 25 MG PO TABS
25.0000 mg | ORAL_TABLET | ORAL | Status: DC | PRN
  Filled 2019-10-23: qty 1

## 2019-10-23 MED ORDER — TORSEMIDE 20 MG PO TABS
40.0000 mg | ORAL_TABLET | ORAL | Status: DC
  Administered 2019-10-24: 40 mg via ORAL
  Filled 2019-10-23: qty 2

## 2019-10-23 MED ORDER — METHYLPREDNISOLONE SODIUM SUCC 40 MG IJ SOLR
40.0000 mg | Freq: Two times a day (BID) | INTRAMUSCULAR | Status: DC
  Administered 2019-10-23 (×2): 40 mg via INTRAVENOUS
  Filled 2019-10-23 (×2): qty 1

## 2019-10-23 MED ORDER — ALPRAZOLAM 0.25 MG PO TABS
0.2500 mg | ORAL_TABLET | Freq: Four times a day (QID) | ORAL | Status: DC | PRN
  Administered 2019-10-23: 0.25 mg via ORAL
  Filled 2019-10-23: qty 1

## 2019-10-23 MED ORDER — HYDRALAZINE HCL 50 MG PO TABS
50.0000 mg | ORAL_TABLET | Freq: Three times a day (TID) | ORAL | Status: DC
  Administered 2019-10-23 – 2019-10-25 (×6): 50 mg via ORAL
  Filled 2019-10-23 (×7): qty 1

## 2019-10-23 MED ORDER — ACETAMINOPHEN 650 MG RE SUPP
650.0000 mg | Freq: Four times a day (QID) | RECTAL | Status: DC | PRN
  Administered 2019-10-25: 650 mg via RECTAL
  Filled 2019-10-23: qty 1

## 2019-10-23 MED ORDER — AMLODIPINE BESYLATE 10 MG PO TABS
10.0000 mg | ORAL_TABLET | Freq: Every day | ORAL | Status: DC
  Administered 2019-10-23 – 2019-10-24 (×2): 10 mg via ORAL
  Filled 2019-10-23: qty 1
  Filled 2019-10-23: qty 2

## 2019-10-23 MED ORDER — PANTOPRAZOLE SODIUM 40 MG PO TBEC
40.0000 mg | DELAYED_RELEASE_TABLET | Freq: Every day | ORAL | Status: DC
  Administered 2019-10-23 – 2019-10-25 (×3): 40 mg via ORAL
  Filled 2019-10-23 (×3): qty 1

## 2019-10-23 MED ORDER — LIDOCAINE 5 % EX PTCH
1.0000 | MEDICATED_PATCH | CUTANEOUS | Status: DC
  Administered 2019-10-24 – 2019-10-25 (×2): 1 via TRANSDERMAL
  Filled 2019-10-23 (×5): qty 1

## 2019-10-23 MED ORDER — DM-GUAIFENESIN ER 30-600 MG PO TB12: 1.0000 | ORAL_TABLET | Freq: Two times a day (BID) | ORAL | Status: DC

## 2019-10-23 MED ORDER — CARVEDILOL 12.5 MG PO TABS
12.5000 mg | ORAL_TABLET | Freq: Two times a day (BID) | ORAL | Status: DC
  Administered 2019-10-23 – 2019-10-24 (×3): 12.5 mg via ORAL
  Filled 2019-10-23: qty 2
  Filled 2019-10-23 (×2): qty 1

## 2019-10-23 MED ORDER — CALCIUM ACETATE (PHOS BINDER) 667 MG PO CAPS
1334.0000 mg | ORAL_CAPSULE | Freq: Three times a day (TID) | ORAL | Status: DC
  Administered 2019-10-23 – 2019-10-25 (×5): 1334 mg via ORAL
  Filled 2019-10-23 (×7): qty 2

## 2019-10-23 MED ORDER — HEPARIN SODIUM (PORCINE) 5000 UNIT/ML IJ SOLN
5000.0000 [IU] | Freq: Three times a day (TID) | INTRAMUSCULAR | Status: DC
  Administered 2019-10-23 – 2019-10-25 (×6): 5000 [IU] via SUBCUTANEOUS
  Filled 2019-10-23 (×7): qty 1

## 2019-10-23 MED ORDER — LORATADINE 10 MG PO TABS
10.0000 mg | ORAL_TABLET | Freq: Every day | ORAL | Status: DC
  Administered 2019-10-23 – 2019-10-25 (×3): 10 mg via ORAL
  Filled 2019-10-23 (×3): qty 1

## 2019-10-23 MED ORDER — GUAIFENESIN ER 600 MG PO TB12
600.0000 mg | ORAL_TABLET | Freq: Two times a day (BID) | ORAL | Status: DC
  Administered 2019-10-23 – 2019-10-25 (×6): 600 mg via ORAL
  Filled 2019-10-23 (×6): qty 1

## 2019-10-23 MED ORDER — CLOPIDOGREL BISULFATE 75 MG PO TABS
75.0000 mg | ORAL_TABLET | Freq: Every day | ORAL | Status: DC
  Administered 2019-10-23 – 2019-10-25 (×3): 75 mg via ORAL
  Filled 2019-10-23 (×3): qty 1

## 2019-10-23 MED ORDER — PRAVASTATIN SODIUM 20 MG PO TABS
40.0000 mg | ORAL_TABLET | Freq: Every day | ORAL | Status: DC
  Administered 2019-10-23 – 2019-10-24 (×2): 40 mg via ORAL
  Filled 2019-10-23: qty 2
  Filled 2019-10-23: qty 1
  Filled 2019-10-23: qty 2

## 2019-10-23 MED ORDER — HYDRALAZINE HCL 25 MG PO TABS: 25.0000 mg | ORAL_TABLET | Freq: Three times a day (TID) | ORAL | Status: DC | PRN

## 2019-10-23 MED ORDER — OXYBUTYNIN CHLORIDE ER 5 MG PO TB24
15.0000 mg | ORAL_TABLET | Freq: Every day | ORAL | Status: DC
  Administered 2019-10-24 – 2019-10-25 (×2): 15 mg via ORAL
  Filled 2019-10-23: qty 3
  Filled 2019-10-23: qty 1
  Filled 2019-10-23: qty 3

## 2019-10-23 NOTE — Progress Notes (Signed)
Established hemodialysis patient known at [facility] ([address] TTS 11:45. Please contact me with any dialysis placement concerns.   Cameren Odwyer Dialysis Coordinator [phone] 

## 2019-10-23 NOTE — Progress Notes (Signed)
HD TX end  Abdominal cramping with 5 minutes remaining. Tx terminated. Cramping relieved post tx.

## 2019-10-23 NOTE — Progress Notes (Signed)
[facility]  ROUNDING NOTE   Subjective:   [name] admitted to [facility] on 10/23/2019 for [facility] Patient placed on BIPAP. Found to have pulmonary edema.  Patient was discharged from [facility] on 11/27 for her severe aortic sclerosis. She was deemed not a surgical candidate for surgical aortic valve replacement (SAVR).  She then was brought back to the ED for shortness of breath on 12/1. Patient was sent to her outpatient dialysis appointment where she left above her estimated dry weight at 62kg, her dry weight is 58kg.   Objective:  Vital signs in last 24 hours:  Temp:  [98.7 F (37.1 C)] 98.7 F (37.1 C) (12/03 0529) Pulse Rate:  [74-102] 77 (12/03 1130) Resp:  [12-30] 17 (12/03 1130) BP: (132-172)/(43-59) 145/46 (12/03 1130) SpO2:  [98 %-100 %] 99 % (12/03 1130) Weight:  [59 kg] 59 kg (12/03 0531)  Weight change:  Filed Weights   10/23/19 0531  Weight: 59 kg    Intake/Output: No intake/output data recorded.   Intake/Output this shift:  No intake/output data recorded.  Physical Exam: General: Critically Ill  Head: BIPAP   Eyes: Anicteric, PERRL  Neck: Supple, trachea midline  Lungs:  Bilateral crackles  Heart: Regular rate and rhythm  Abdomen:  Soft, nontender, obese  Extremities:  +peripheral edema.  Neurologic: Nonfocal, moving all four extremities  Skin: No lesions  Access: Left AVF    Basic Metabolic Panel: Recent Labs  Lab 10/17/19 0245 10/21/19 0650 10/23/19 0544  NA 133* 135 134*  K 4.5 4.5 4.1  CL 94* 95* 94*  CO2 28 26 26   GLUCOSE 97 107* 148*  BUN 18 35* 34*  CREATININE 3.85* 6.48* 6.00*  CALCIUM 8.5* 9.1 8.8*  MG  --  2.2  --     Liver Function Tests: Recent Labs  Lab 10/21/19 0650 10/23/19 0544  AST 15 20  ALT 8 8  ALKPHOS 74 79  BILITOT 0.9 0.8  PROT 6.2* 6.0*  ALBUMIN 3.7 3.4*   Recent Labs  Lab 10/21/19 0650  LIPASE 20   No results for input(s): AMMONIA in the last 168  hours.  CBC: Recent Labs  Lab 10/21/19 0650 10/23/19 0544 10/23/19 1052  WBC 7.9 14.0* 12.1*  NEUTROABS 6.0 11.8*  --   HGB 8.2* 8.0* 7.5*  HCT 25.2* 25.5* 23.8*  MCV 91.0 96.6 95.2  PLT 227 265 189    Cardiac Enzymes: No results for input(s): CKTOTAL, CKMB, CKMBINDEX, TROPONINI in the last 168 hours.  BNP: Invalid input(s): POCBNP  CBG: Recent Labs  Lab 10/16/19 1242 10/16/19 1552 10/16/19 2128 10/17/19 0704  GLUCAP 104* 132* 131* 98    Microbiology: Results for orders placed or performed during the hospital encounter of 10/23/19  SARS Coronavirus 2 by RT PCR (hospital order, performed in [facility] hospital lab) Nasopharyngeal Nasopharyngeal Swab     Status: None   Collection Time: 10/23/19  5:57 AM   Specimen: Nasopharyngeal Swab  Result Value Ref Range Status   SARS Coronavirus 2 NEGATIVE NEGATIVE Final    Comment: (NOTE) SARS-CoV-2 target nucleic acids are NOT DETECTED. The SARS-CoV-2 RNA is generally detectable in upper and lower respiratory specimens during the acute phase of infection. The lowest concentration of SARS-CoV-2 viral copies this assay can detect is 250 copies / mL. A negative result does not preclude SARS-CoV-2 infection and should not be used as the sole basis for treatment or other patient management decisions.  A negative result may occur  with improper specimen collection / handling, submission of specimen other than nasopharyngeal swab, presence of viral mutation(s) within the areas targeted by this assay, and inadequate number of viral copies (<250 copies / mL). A negative result must be combined with clinical observations, patient history, and epidemiological information. Fact Sheet for Patients:   StrictlyIdeas.no Fact Sheet for Healthcare Providers: BankingDealers.co.za This test is not yet approved or cleared  by the [place] FDA and has been authorized for detection and/or  diagnosis of SARS-CoV-2 by FDA under an Emergency Use Authorization (EUA).  This EUA will remain in effect (meaning this test can be used) for the duration of the COVID-19 declaration under Section 564(b)(1) of the Act, 21 U.S.C. section 360bbb-3(b)(1), unless the authorization is terminated or revoked sooner. Performed at [facility], [address] [place], [place] [address]     Coagulation Studies: No results for input(s): LABPROT, INR in the last 72 hours.  Urinalysis: No results for input(s): COLORURINE, LABSPEC, PHURINE, GLUCOSEU, HGBUR, BILIRUBINUR, KETONESUR, PROTEINUR, UROBILINOGEN, NITRITE, LEUKOCYTESUR in the last 72 hours.  Invalid input(s): APPERANCEUR    Imaging: Dg Chest Port 1 View  Result Date: 10/23/2019 CLINICAL DATA:  Shortness of breath and wheezing. EXAM: PORTABLE CHEST 1 VIEW COMPARISON:  Chest x-ray 10/21/2019 FINDINGS: The heart is mildly enlarged but stable. Stable tortuosity and extensive calcification of the thoracic aorta. Mild peribronchial thickening and increased interstitial markings which may suggest bronchitis or reactive airways disease. No definite infiltrates or effusions. IMPRESSION: Stable cardiac enlargement and tortuosity and calcification of the thoracic aorta. Bronchitic changes could suggest bronchitis or interstitial pneumonitis. No focal infiltrates or effusions. Electronically Signed   By: Marijo Sanes M.D.   On: 10/23/2019 06:32     Medications:    . dextromethorphan  30 mg Oral BID   And  . guaiFENesin  600 mg Oral BID  . epoetin (EPOGEN/PROCRIT) injection  10,000 Units Intravenous Q T,Th,Sa-HD  . ipratropium  0.5 mg Nebulization Q4H  . levalbuterol  1.25 mg Nebulization Q6H  . methylPREDNISolone (SOLU-MEDROL) injection  40 mg Intravenous Q12H     Assessment/ Plan:  Phyllis [name] is a 73 y.o. white female with end stage renal disease on hemodialysis, severe aortic stenosis, hypertension, diabetes  mellitus type II, COPD, congestive heart failure, coronary artery disease, chronic costochondritis, who is admitted to [facility] on 10/23/2019 for acute respiratory failure with pulmonary edema requiring noninvasive mechanical ventilation  [facility] TTS [address] 58kg Left AVF  1. End Stage Renal Disease: with pulmonary edema and volume overload - Emergent hemodialysis treatment for today. Orders prepared.   2. Acute respiratory failure requiring noninvasive mechanical ventilation: secondary to acute exacerbation of congestive heart failure from aortic stenosis - Aggressive ultrafiltration to get patient to her target weight of 58kg.   3. Anemia of chronic kidney disease: hemoglobin 7.5 - EPO with HD treatment  4. Hypertension: Currently blood pressure readings are at goal.   5. Secondary Hyperparathyroidism:  - resume calcium acetate when stable.    LOS: 0 Andalyn Heckstall 12/3/202012:10 PM

## 2019-10-23 NOTE — H&P (Addendum)
History and Physical    Phyllis [name] [id number] DOB: 09/10/1946 DOA: 10/23/2019  Referring MD/NP/PA:   PCP: [name], MD   Patient coming from:  The patient is coming from SNF.  At baseline, pt is dependent for most of ADL.        Chief Complaint: Shortness of breath  HPI: Phyllis [name] is a 73 y.o. female with medical history significant of hypertension, hyperlipidemia, COPD, stroke, GERD, gout, anxiety, severe aortic valve stenosis, ESRD-HD (TTS), recent pulmonary edema and PEA arrest, anemia, dCHF, who presents with SOB.  Pt states that her SOB has been worsening the past several days.  She has dry cough but no wheezing.  Oxygen desaturated to 83% on room air per report.  Of note, patient was recently discharged on 3 L oxygen.  She also reports intermittent mild chest discomfort.  No nausea vomiting, diarrhea or abdominal pain.  No symptoms of UTI or unilateral weakness.  Per EDP's note, "patient had a recent hospitalization requiring intubation, made comfort care but upon extubation patient awoke and wished to pursue aggressive care.  She was subsequently transferred to [facility] for evaluation of possible TAVR. Heart catheterization 10/14/19 with severe left main and ostial LAD disease. She was not a candidate for sternotomy/CABG or high risk PCI.  Palliative care team was consulted.  She is DNR but no home hospice because she wished to continue hemodialysis". Today I discussed with pt about code status and explained the meaning of CODE STATUS, patient wants to be partial code, OK for intubation, but no CPR).  ED Course: pt was found to have WBC 14.0, troponin 31, BMP 1573, negative COVID-19 test, potassium 4.1, bicarbonate 26, creatinine 6.00, BUN 34, temperature normal, blood pressure 138/52, heart rate 102, 75, tachypnea, oxygen saturation 98% on BiPAP.  Chest x-ray showed stable cardiomegaly and bronchitis change.  ABG with pH 7.4, CO2 50, PO2 107. Patient is  admitted to stepdown as inpatient.  Renal was consulted for dialysis.  Review of Systems:   General: no fevers, chills, no body weight gain, has fatigue HEENT: no blurry vision, hearing changes or sore throat Respiratory: has dyspnea, coughing, wheezing CV: has chest pain discomfort, no palpitations GI: no nausea, vomiting, abdominal pain, diarrhea, constipation GU: no dysuria, burning on urination, increased urinary frequency, hematuria  Ext: trace leg edema Neuro: no unilateral weakness, numbness, or tingling, no vision change or hearing loss Skin: no rash, no skin tear. MSK: No muscle spasm, no deformity, no limitation of range of movement in spin Heme: No easy bruising.  Travel history: No recent long distant travel.  Allergy:  Allergies  Allergen Reactions  . Enalapril Maleate Other (See Comments)    Other reaction(s): Headache  . Nitrofurantoin Swelling and Rash    Other Reaction: swelling of body  . Sulfamethoxazole-Trimethoprim Swelling  . 2,4-D Dimethylamine (Amisol) Rash and Other (See Comments)    Other Reaction: h/a  . Baclofen Other (See Comments) and Nausea Only    lightheadness ,drowsiness , muscle weakness , twitching in hands   . Neosporin [Neomycin-Bacitracin Zn-Polymyx] Other (See Comments) and Rash    Other Reaction: irritation Skin irritation   . Quinine Nausea And Vomiting, Rash and Other (See Comments)    Other Reaction: Vomiting, rash, h/a, vision  . Ultram [Tramadol] Palpitations  . Zocor [Simvastatin] Other (See Comments) and Rash    Other Reaction: muscle spasms Muscle pain and spasms  . Bactrim [Sulfamethoxazole-Trimethoprim] Swelling  . Levodopa Other (See Comments)  Reaction: unknown  . Macrodantin [Nitrofurantoin Macrocrystal] Swelling  . Quinine Derivatives Other (See Comments)    Vertigo,nausea vomiting blurred vision headache ears sensitivity     Past Medical History:  Diagnosis Date  . (HFpEF) heart failure with preserved ejection  fraction ([place])    a. TTE 01/2014: nl LV sys fxn, no valvular abnormalities; b. TTE 11/16: nl EF, mild LVH;  c. 04/2019 Echo: EF 60-65%. DD. Nl RV fxn. Mod AS. 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  . Allergy   . Anemia of chronic disease   . Anxiety   . Aortic atherosclerosis ([place])   . Asthma   . Chronic back pain   . COPD (chronic obstructive pulmonary disease) ([place])   . Diabetes mellitus with complication ([place])   . ESRD on hemodialysis ([place])    a. Tues/Sat; b. 2/2 small kidneys  . Essential hypertension   . Fistula    lower left arm  . GERD (gastroesophageal reflux disease)   . Gout   . History of exercise stress test    a. 01/2014: no evidence of ischemia; b. Lexiscan 08/2015: no sig ischemia, severe GI uptake artifact, low risk; c. CPET @ [facility] 09/2016: exercised 3 min 12 sec on bike without incline, 2.28 METs, VO2 of 8.1, 48% of predicted, indicating mod to sev functional impairment, evidence of blunted HR, stroke volume, and BP augmentation as well as ventilation-perfusion mismatch with exercise  . HLD (hyperlipidemia)   . Non-obstructive Carotid arterial disease ([place])    a. 12/2017: <50% bilat ICA dzs.  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 Severe aortic stenosis    a. by 09/2019 echo b. 04/2019 Echo: Mod AS.  Marland Kitchen Sleep apnea     Past Surgical History:  Procedure Laterality Date  . carpel tunnel    . GALLBLADDER SURGERY    . PERIPHERAL VASCULAR CATHETERIZATION N/A 04/12/2015   Procedure: A/V Shuntogram/Fistulagram;  Surgeon: Algernon Huxley, MD;  Location: [place] [facility];  Service: Cardiovascular;  Laterality: N/A;  . PERIPHERAL VASCULAR CATHETERIZATION N/A 04/12/2015   Procedure: A/V Shunt Intervention;  Surgeon: Algernon Huxley, MD;  Location: [place] CV LAB;  Service: Cardiovascular;  Laterality: N/A;  . PERIPHERAL VASCULAR CATHETERIZATION N/A 06/09/2015   Procedure: Dialysis/Perma Catheter Removal;  Surgeon: Katha Cabal, MD;   Location: [place] CV LAB;  Service: Cardiovascular;  Laterality: N/A;  . RIGHT/LEFT HEART CATH AND CORONARY ANGIOGRAPHY N/A 10/14/2019   Procedure: RIGHT/LEFT HEART CATH AND CORONARY ANGIOGRAPHY;  Surgeon: Belva Crome, MD;  Location: [place] CV LAB;  Service: Cardiovascular;  Laterality: N/A;    Social History:  reports that she has never smoked. She has never used smokeless tobacco. She reports that she does not drink alcohol or use drugs.  Family History:  Family History  Problem Relation Age of Onset  . Hypertension Mother   . Hyperlipidemia Mother   . Heart disease Father   . Heart attack Father 66  . Hypertension Father   . Hyperlipidemia Father   . Heart disease Brother        CABG   . Heart attack Brother   . Breast cancer Neg Hx      Prior to Admission medications   Medication Sig Start Date End Date Taking? Authorizing Provider  acetaminophen (TYLENOL) 500 MG tablet Take 1 tablet (500 mg total) by mouth every 6 (six) hours as needed for mild pain or fever. 09/29/19  Danford, Suann Larry, MD  albuterol (PROVENTIL HFA;VENTOLIN HFA) 108 (90 Base) MCG/ACT inhaler Inhale 2 puffs into the lungs every 6 (six) hours as needed for wheezing or shortness of breath.    [provider]  amLODipine (NORVASC) 10 MG tablet Take 10 mg by mouth daily.  12/29/13   [provider]  benzonatate (TESSALON) 100 MG capsule Take 1 capsule (100 mg total) by mouth 2 (two) times daily as needed for cough. 10/17/19   [facility], Tami Lin, PA  calcium acetate (PHOSLO) 667 MG capsule Take 1,334 mg by mouth 3 (three) times daily with meals.     [provider]  carvedilol (COREG) 12.5 MG tablet Take 12.5 mg by mouth 2 (two) times daily with a meal.  01/09/14   [provider]  cetirizine (ZYRTEC) 10 MG tablet Take 10 mg by mouth daily as needed for allergies.     [provider]  clopidogrel (PLAVIX) 75 MG tablet Take 1 tablet (75 mg total) by mouth  daily. 01/18/18   Epifanio Lesches, MD  Fluticasone-Salmeterol (ADVAIR) 250-50 MCG/DOSE AEPB Inhale 1 puff into the lungs 2 (two) times daily as needed (for shortness of breath). 09/07/19   Henreitta Leber, MD  hydrALAZINE (APRESOLINE) 50 MG tablet Take 1 tablet (50 mg total) by mouth every 8 (eight) hours. 09/29/19   Danford, Suann Larry, MD  HYDROcodone-acetaminophen (NORCO) 7.5-325 MG tablet Take 1 tablet by mouth every 6 (six) hours as needed (pain). 10/08/19   Danford, Suann Larry, MD  ipratropium-albuterol (DUONEB) 0.5-2.5 (3) MG/3ML SOLN Take 3 mLs by nebulization every 6 (six) hours as needed. 10/17/19   [facility], Tami Lin, PA  lidocaine (LIDODERM) 5 % Place 1 patch onto the skin daily. Remove & Discard patch within 12 hours or as directed by MD 10/14/19   Flora Lipps, MD  lidocaine-prilocaine (EMLA) cream Apply 1 application topically as needed (prior to treatment).     [provider]  losartan (COZAAR) 50 MG tablet Take 50 mg by mouth daily. 03/18/19   [provider]  ondansetron (ZOFRAN) 4 MG tablet Take 4 mg by mouth every 8 (eight) hours as needed. 05/20/19   [provider]  oxybutynin (DITROPAN XL) 15 MG 24 hr tablet Take 15 mg by mouth daily. 12/24/17   [provider]  pantoprazole (PROTONIX) 40 MG tablet Take 1 tablet (40 mg total) by mouth daily. 10/17/19 10/16/20  Ledora Bottcher, PA  pravastatin (PRAVACHOL) 40 MG tablet Take 40 mg by mouth at bedtime.     [provider]  scopolamine (TRANSDERM-SCOP) 1 MG/3DAYS Place 1 patch onto the skin every 3 (three) days.    [provider]  topiramate (TOPAMAX) 25 MG tablet Take 1 tablet (25 mg total) by mouth as needed (for headaches). 09/07/19   Henreitta Leber, MD  torsemide (DEMADEX) 20 MG tablet Take 2 tablets (40 mg total) by mouth every Monday,Wednesday,Friday, and Sunday at 6 PM. 10/01/19   Edwin Dada, MD    Physical Exam: Vitals:   10/23/19 0900  10/23/19 0915 10/23/19 0930 10/23/19 0945  BP: (!) 156/58 (!) 170/58 (!) 166/57 (!) 171/59  Pulse: 84 93 85 93  Resp: 16 (!) 22 14 16   Temp:      TempSrc:      SpO2: 100% 100% 100% 100%  Weight:      Height:       General: Not in acute distress HEENT:       Eyes: PERRL, EOMI,  no scleral icterus.       ENT: No discharge from the ears and nose, no pharynx injection, no tonsillar enlargement.        Neck: No JVD, no bruit, no mass felt. Heme: No neck lymph node enlargement. Cardiac: S1/S2, RRR, No murmurs, No gallops or rubs. Respiratory: decreased air movement bilaterally, has mild wheezing. GI: Soft, nondistended, nontender, no rebound pain, no organomegaly, BS present. GU: No hematuria Ext: has trace leg edema bilaterally. 2+DP/PT pulse bilaterally. Musculoskeletal: No joint deformities, No joint redness or warmth, no limitation of ROM in spin. Skin: No rashes.  Neuro: Alert, oriented X3, cranial nerves II-XII grossly intact, moves all extremities normally. Psych: Patient is not psychotic, no suicidal or hemocidal ideation.  Labs on Admission: I have personally reviewed following labs and imaging studies  CBC: Recent Labs  Lab 10/21/19 0650 10/23/19 0544  WBC 7.9 14.0*  NEUTROABS 6.0 11.8*  HGB 8.2* 8.0*  HCT 25.2* 25.5*  MCV 91.0 96.6  PLT 227 [id number]   Basic Metabolic Panel: Recent Labs  Lab 10/17/19 0245 10/21/19 0650 10/23/19 0544  NA 133* 135 134*  K 4.5 4.5 4.1  CL 94* 95* 94*  CO2 28 26 26   GLUCOSE 97 107* 148*  BUN 18 35* 34*  CREATININE 3.85* 6.48* 6.00*  CALCIUM 8.5* 9.1 8.8*  MG  --  2.2  --    GFR: Estimated Creatinine Clearance: 6.3 mL/min (A) (by C-G formula based on SCr of 6 mg/dL (H)). Liver Function Tests: Recent Labs  Lab 10/21/19 0650 10/23/19 0544  AST 15 20  ALT 8 8  ALKPHOS 74 79  BILITOT 0.9 0.8  PROT 6.2* 6.0*  ALBUMIN 3.7 3.4*   Recent Labs  Lab 10/21/19 0650  LIPASE 20   No results for input(s): AMMONIA in the last 168  hours. Coagulation Profile: No results for input(s): INR, PROTIME in the last 168 hours. Cardiac Enzymes: No results for input(s): CKTOTAL, CKMB, CKMBINDEX, TROPONINI in the last 168 hours. BNP (last 3 results) No results for input(s): PROBNP in the last 8760 hours. HbA1C: No results for input(s): HGBA1C in the last 72 hours. CBG: Recent Labs  Lab 10/16/19 1242 10/16/19 1552 10/16/19 2128 10/17/19 0704  GLUCAP 104* 132* 131* 98   Lipid Profile: No results for input(s): CHOL, HDL, LDLCALC, TRIG, CHOLHDL, LDLDIRECT in the last 72 hours. Thyroid Function Tests: No results for input(s): TSH, T4TOTAL, FREET4, T3FREE, THYROIDAB in the last 72 hours. Anemia Panel: No results for input(s): VITAMINB12, FOLATE, FERRITIN, TIBC, IRON, RETICCTPCT in the last 72 hours. Urine analysis:    Component Value Date/Time   COLORURINE YELLOW (A) 11/28/2018 2146   APPEARANCEUR CLEAR (A) 11/28/2018 2146   LABSPEC 1.012 11/28/2018 2146   PHURINE 8.0 11/28/2018 2146   GLUCOSEU 50 (A) 11/28/2018 2146   HGBUR NEGATIVE 11/28/2018 2146   BILIRUBINUR NEGATIVE 11/28/2018 2146   KETONESUR 5 (A) 11/28/2018 2146   PROTEINUR >=300 (A) 11/28/2018 2146   NITRITE NEGATIVE 11/28/2018 2146   LEUKOCYTESUR NEGATIVE 11/28/2018 2146   Sepsis Labs: @LABRCNTIP (procalcitonin:4,lacticidven:4) ) Recent Results (from the past 240 hour(s))  SARS Coronavirus 2 by RT PCR (hospital order, performed in [place] hospital lab) Nasopharyngeal Nasopharyngeal Swab     Status: None   Collection Time: 10/23/19  5:57 AM   Specimen: Nasopharyngeal Swab  Result Value Ref Range Status   SARS Coronavirus 2 NEGATIVE NEGATIVE Final    Comment: (NOTE) SARS-CoV-2 target nucleic acids are NOT DETECTED. The SARS-CoV-2 RNA is generally  detectable in upper and lower respiratory specimens during the acute phase of infection. The lowest concentration of SARS-CoV-2 viral copies this assay can detect is 250 copies / mL. A negative result  does not preclude SARS-CoV-2 infection and should not be used as the sole basis for treatment or other patient management decisions.  A negative result may occur with improper specimen collection / handling, submission of specimen other than nasopharyngeal swab, presence of viral mutation(s) within the areas targeted by this assay, and inadequate number of viral copies (<250 copies / mL). A negative result must be combined with clinical observations, patient history, and epidemiological information. Fact Sheet for Patients:   StrictlyIdeas.no Fact Sheet for Healthcare Providers: BankingDealers.co.za This test is not yet approved or cleared  by the [place] FDA and has been authorized for detection and/or diagnosis of SARS-CoV-2 by FDA under an Emergency Use Authorization (EUA).  This EUA will remain in effect (meaning this test can be used) for the duration of the COVID-19 declaration under Section 564(b)(1) of the Act, 21 U.S.C. section 360bbb-3(b)(1), unless the authorization is terminated or revoked sooner. Performed at [facility], [address] [place], [place] [address]      Radiological Exams on Admission: Dg Chest [facility] 1 View  Result Date: 10/23/2019 CLINICAL DATA:  Shortness of breath and wheezing. EXAM: PORTABLE CHEST 1 VIEW COMPARISON:  Chest x-ray 10/21/2019 FINDINGS: The heart is mildly enlarged but stable. Stable tortuosity and extensive calcification of the thoracic aorta. Mild peribronchial thickening and increased interstitial markings which may suggest bronchitis or reactive airways disease. No definite infiltrates or effusions. IMPRESSION: Stable cardiac enlargement and tortuosity and calcification of the thoracic aorta. Bronchitic changes could suggest bronchitis or interstitial pneumonitis. No focal infiltrates or effusions. Electronically Signed   By: Marijo Sanes M.D.   On: 10/23/2019 06:32      EKG: Independently reviewed.  Sinus rhythm, QTC 394, LAE, nonspecific T wave change.  Assessment/Plan Principal Problem:   Acute on chronic respiratory failure with hypoxia (HCC) Active Problems:   ESRD (end stage renal disease) on dialysis (HCC)   Hyperlipidemia   CVA (cerebral vascular accident) ([place])   Chronic diastolic heart failure (HCC)   HTN (hypertension)   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-stage renal disease) ([place])   COPD with acute exacerbation (HCC)   Elevated troponin   CAD (coronary artery disease)   COPD exacerbation (HCC)   Leukocytosis   Anxiety   Acute on chronic respiratory failure with hypoxia ([place]): Mainly due to combination of pulmonary edema and COPD exacerbation given cough and wheezing on auscultation, also has other multifactorial etiologies including ESRD and dCHF. Covid19 negative.  -will admit patient to SDU as inpt -->changed to med-surg bed  - continue BiPAP -->wened off -Code dilators -Solu-Medrol 40 mg IV tid -Mucinex for cough  -Incentive spirometry -Follow up blood culture x2 (pt has leukocytosis), sputum culture -Nasal cannula oxygen as needed to maintain O2 saturation 93% or greater when off BiPAP -renal was consulted for HD  ESRD (end stage renal disease) on dialysis (TTS): -Dr. Juleen China of renal was consulted for HD  Hyperlipidemia: -Pravastatin  Hx of CVA (cerebral vascular accident) [facility]): -Pravastatin, Plavix  Chronic diastolic heart failure ([place]): 2D echo 09/21/2019 showed EF of 60-85%. Pt seems to have fluid overload -Volume management per renal by dialysis -Patient is on torsemide  HTN (hypertension): -Amlodipine, Coreg, hydralazine, losartan  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-stage renal disease) ([place]): hgb stable, 7.9 on 10/15/19 --> 8.0 today -f/u by CBC  Hx of CAD an elevated troponin: trop minimally elevated at 31 in the setting of ESRD, lower suspicion for heart attack. -continue pravastatin and Plavix  Leukocytosis: WBC 14.0. no fever,  possibly reactive. -f/u by CBC  Anxiety: -continue home Xanax   Inpatient status:  # Patient requires inpatient status due to high intensity of service, high risk for further deterioration and high frequency of surveillance required.  I certify that at the point of admission it is my clinical judgment that the patient will require inpatient hospital care spanning beyond 2 midnights from the point of admission.  . This patient has multiple chronic comorbidities including hypertension, hyperlipidemia, COPD, stroke, GERD, gout, anxiety, severe aortic valve stenosis, ESRD-HD (TTS), recent pulmonary edema and PEA arrest, anemia, dCHF . Now patient has presenting with acute on chronic respiratory failure with hypoxia requiring BiPAP, COPD exacerbation . The worrisome physical exam findings include decreased air movement and wheezing on auscultation . The initial radiographic and laboratory data are worrisome because of leukocytosis, elevated troponin. . Current medical needs: please see my assessment and plan . Predictability of an adverse outcome (risk): Patient has multiple comorbidities, now presents with acute on chronic respiratory failure with hypoxia requiring BiPAP and COPD exacerbation. Her presentation is highly complicated.  Given multiple comorbidities, patient is at high risk of deteriorating.  Will need to be treated in the hospital for at least 2 days      DVT ppx: SQ Heparin      Code Status:  Partial code (I discussed with the patient, and explained the meaning of CODE STATUS, patient wants to be partial code, OK for intubation, but no CPR). Family Communication: None at bed side.    Disposition Plan:  Anticipate discharge back to previous SNF environment Consults called:  Renal Dr. Milana Na Admission status: SDU/inpation       Date of Service 10/23/2019    Ivor Costa [facility]   If 7PM-7AM, please contact night-coverage [email or web address] Password [email or web address] 10/23/2019,  10:07 AM

## 2019-10-23 NOTE — ED Notes (Signed)
Pt c/o bipap mask leaking. Pt reassured that mask is not leaking and that Bipap machine is not alarming. I demonstrated a leaking mask and machine alarm by sticking my finger under mask and allowing machine to alarm. This did not reassure pt. I attempted to adjust mask to see if pt would be more comfortable to no avail. Pts pox has read 100% for past hour - see flowsheet. RT called to come and assess mask. Per Cory Roughen, RT, he had spoken to pt several times today about her concerns of mask leaking and had reassured her it was not leaking. Cory Roughen, RT, agreed to come replace mask. RT to bedside and mask replaced. Pt at this time is still concerned that new mask is leaking. Dialysis RN at bedside for next 3.5 hrs.

## 2019-10-23 NOTE — Progress Notes (Signed)
HD START   10/23/19 1315  Vital Signs  Pulse Rate 84 (Simultaneous filing. User may not have seen previous data.)  Pulse Rate Source Monitor  Resp (!) 24 (Simultaneous filing. User may not have seen previous data.)  BP (!) 166/51 (Simultaneous filing. User may not have seen previous data.)  BP Location Right Arm  BP Method Automatic  Patient Position (if appropriate) Lying  Oxygen Therapy  SpO2 100 % (Simultaneous filing. User may not have seen previous data.)  O2 Device Bi-PAP  During Hemodialysis Assessment  Blood Flow Rate (mL/min) 200 mL/min  Arterial Pressure (mmHg) 90 mmHg  Venous Pressure (mmHg) -90 mmHg  Transmembrane Pressure (mmHg) 80 mmHg  Ultrafiltration Rate (mL/min) 0.71 mL/min  Dialysate Flow Rate (mL/min) 600 ml/min  Conductivity: Machine  14  HD Safety Checks Performed Yes  Dialysis Fluid Bolus Normal Saline  Bolus Amount (mL) 250 mL  Intra-Hemodialysis Comments Tx initiated (16g needles )

## 2019-10-23 NOTE — ED Triage Notes (Signed)
Pt presents w/ audible wheezing, rhonchi, and rales. Pt administered albuterol neb x 1 by EMS. Pt reported to have RA SAO2=83% at [place]. Pt - for COVID, last tested on 10/22/2019.

## 2019-10-23 NOTE — ED Notes (Signed)
Pt weaned to home o2 rate of 3 LNC

## 2019-10-23 NOTE — ED Notes (Signed)
Pt taken off Bipap and placed on 5 LNC.

## 2019-10-23 NOTE — Progress Notes (Signed)
PRE-HD   10/23/19 1300  Hand-Off documentation  Report received from (Full Name) BIll  Vital Signs  Pulse Rate 82  Pulse Rate Source Monitor  Resp 19  BP (!) 159/61  BP Location Right Arm  BP Method Automatic  Patient Position (if appropriate) Lying  Oxygen Therapy  SpO2 100 %  O2 Device Bi-PAP  Pain Assessment  Pain Scale 0-10  Pain Score 0  Time-Out for Hemodialysis  What Procedure? hd  Pt Identifiers(min of two) First/Last Name;MRN/Account#  Correct Site? Yes  Correct Side? Yes  Correct Procedure? Yes  Consents Verified? Yes  Rad Studies Available? N/A  Safety Precautions Reviewed? Yes  Engineer, civil (consulting) Number [id number]  Station Number  [id number])  UF/Alarm Test Passed  Conductivity: Meter 14  Conductivity: Machine  14  pH 7.3  Reverse Osmosis 3705  Normal Saline Lot Number [id number]  Dialyzer Lot Number [id number]  Disposable Set Lot Number [id number]  Machine Temperature 98.6 F (37 C)  Musician and Audible Yes  Blood Lines Intact and Secured Yes  Pre Treatment Patient Checks  Vascular access used during treatment Fistula  Hepatitis B Surface Antigen Results Negative  Date Hepatitis B Surface Antigen Drawn 10/16/19  Hepatitis B Surface Antibody  (>10)  Date Hepatitis B Surface Antibody Drawn 10/16/19  Hemodialysis Consent Verified Yes  Hemodialysis Standing Orders Initiated Yes  ECG (Telemetry) Monitor On Yes  Prime Ordered Normal Saline  Length of  DialysisTreatment -hour(s) 3.5 Hour(s)  Dialyzer Elisio 17H NR  Dialysate 2K;2.5 Ca  Variable Sodium  (140)  Dialysate Flow Ordered 600  Blood Flow Rate Ordered 400 mL/min  Ultrafiltration Goal 3 Liters  Education / Care Plan  Dialysis Education Provided Yes  Documented Education in Care Plan Yes  Fistula / Graft Left Forearm Arteriovenous fistula  No Placement Date or Time found.   Placed prior to admission: Yes  Orientation: Left  Access Location: Forearm  Access Type: Arteriovenous fistula  Site  Condition No complications  Fistula / Graft Assessment Present;Thrill;Bruit  Status Accessed  Needle Size 15g

## 2019-10-23 NOTE — ED Notes (Signed)
Daily medications held due to dialysis running at this time.

## 2019-10-23 NOTE — ED Provider Notes (Signed)
[facility] Emergency Department Provider Note   ____________________________________________   First MD Initiated Contact with Patient 10/23/19 [phone]     (approximate)  I have reviewed the triage vital signs and the nursing notes.   HISTORY  Chief Complaint Respiratory Distress  Level V caveat: limited by respiratory distress  HPI Phyllis [name] is a 73 y.o. female brought to the ED from cardiac [facility] via EMS with a chief complaint of respiratory distress.  Room air saturations 83%.  However, patient was last discharged from the hospital on 3 L O2.  Patient has a history of severe aortic valve stenosis, ESRD on HD TTS,recurrent flash pulmonary edema, recent respiratory/PEA arrest,anemia of chronic disease, aortic atherosclerosis, anxiety, DM, HTN, HLD, obesity, asthma, COPD, OSA.  Patient had a recent hospitalization requiring intubation, made comfort care but upon extubation patient awoke and wished to pursue aggressive care.  She was subsequently transferred to [facility] for evaluation of possible TAVR. Heart catheterization 10/14/19 with severe left main and ostial LAD disease. She was not a candidate for sternotomy/CABG or high risk PCI.  Palliative care team was consulted.  She is DNR but no home hospice because she wished to continue hemodialysis.    SNF staff reported to EMS that patient last tested negative for Covid 2 days ago during their weekly testing.    Past Medical History:  Diagnosis Date  . (HFpEF) heart failure with preserved ejection fraction ([place])    a. TTE 01/2014: nl LV sys fxn, no valvular abnormalities; b. TTE 11/16: nl EF, mild LVH;  c. 04/2019 Echo: EF 60-65%. DD. Nl RV fxn. Mod AS. 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  . Allergy   . Anemia of chronic disease   . Anxiety   . Aortic atherosclerosis ([place])   . Asthma   . Chronic back pain   . COPD (chronic obstructive pulmonary disease) ([place])   .  Diabetes mellitus with complication ([place])   . ESRD on hemodialysis ([place])    a. Tues/Sat; b. 2/2 small kidneys  . Essential hypertension   . Fistula    lower left arm  . GERD (gastroesophageal reflux disease)   . Gout   . History of exercise stress test    a. 01/2014: no evidence of ischemia; b. Lexiscan 08/2015: no sig ischemia, severe GI uptake artifact, low risk; c. CPET @ [facility] 09/2016: exercised 3 min 12 sec on bike without incline, 2.28 METs, VO2 of 8.1, 48% of predicted, indicating mod to sev functional impairment, evidence of blunted HR, stroke volume, and BP augmentation as well as ventilation-perfusion mismatch with exercise  . HLD (hyperlipidemia)   . Non-obstructive Carotid arterial disease ([place])    a. 12/2017: <50% bilat ICA dzs.  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 Severe aortic stenosis    a. by 09/2019 echo b. 04/2019 Echo: Mod AS.  Marland Kitchen Sleep apnea     Patient Active Problem List   Diagnosis Date Noted  . Palliative care by specialist   . Goals of care, counseling/discussion   . Flash pulmonary edema ([place]) 10/14/2019  . Respiratory failure with hypoxia ([place]) 10/11/2019  . Anemia   . Acute on chronic respiratory failure with hypoxia ([place]) 10/01/2019  . Severe aortic valve stenosis   . Acute respiratory failure with hypoxemia ([place]) 09/20/2019  . COPD with acute bronchitis ([place]) 09/05/2019  . Congestive heart failure ([place]) 05/24/2019  . Hyperkalemia 11/29/2018  .  Chronic diastolic heart failure ([place]) 11/02/2018  . HTN (hypertension) 11/02/2018  . Uncontrolled hypertension 10/21/2018  . History of acute pulmonary edema 10/21/2018  . Pressure injury of skin 03/16/2018  . CVA (cerebral vascular accident) ([place]) 01/16/2018  . Aortic atherosclerosis ([place]) 12/11/2017  . Chest pain 09/18/2017  . Hyperlipidemia 08/06/2015  . Complication from renal dialysis device 04/12/2015  . SOB (shortness of breath) 02/01/2015  . End stage renal disease on  dialysis ([place]) 02/01/2015  . Type 2 diabetes mellitus with other specified complication ([place]) [id number]  . Asthma 02/01/2015  . Acute on chronic diastolic CHF (congestive heart failure) ([place]) 02/01/2015    Past Surgical History:  Procedure Laterality Date  . carpel tunnel    . GALLBLADDER SURGERY    . PERIPHERAL VASCULAR CATHETERIZATION N/A 04/12/2015   Procedure: A/V Shuntogram/Fistulagram;  Surgeon: Algernon Huxley, MD;  Location: [place] [facility];  Service: Cardiovascular;  Laterality: N/A;  . PERIPHERAL VASCULAR CATHETERIZATION N/A 04/12/2015   Procedure: A/V Shunt Intervention;  Surgeon: Algernon Huxley, MD;  Location: [place] CV [facility];  Service: Cardiovascular;  Laterality: N/A;  . PERIPHERAL VASCULAR CATHETERIZATION N/A 06/09/2015   Procedure: Dialysis/Perma Catheter Removal;  Surgeon: Katha Cabal, MD;  Location: [place] CV LAB;  Service: Cardiovascular;  Laterality: N/A;  . RIGHT/LEFT HEART CATH AND CORONARY ANGIOGRAPHY N/A 10/14/2019   Procedure: RIGHT/LEFT HEART CATH AND CORONARY ANGIOGRAPHY;  Surgeon: Belva Crome, MD;  Location: [place] [facility];  Service: Cardiovascular;  Laterality: N/A;    Prior to Admission medications   Medication Sig Start Date End Date Taking? Authorizing Provider  acetaminophen (TYLENOL) 500 MG tablet Take 1 tablet (500 mg total) by mouth every 6 (six) hours as needed for mild pain or fever. 09/29/19   Danford, Suann Larry, MD  albuterol (PROVENTIL HFA;VENTOLIN HFA) 108 (90 Base) MCG/ACT inhaler Inhale 2 puffs into the lungs every 6 (six) hours as needed for wheezing or shortness of breath.    [provider]  amLODipine (NORVASC) 10 MG tablet Take 10 mg by mouth daily.  12/29/13   [provider]  benzonatate (TESSALON) 100 MG capsule Take 1 capsule (100 mg total) by mouth 2 (two) times daily as needed for cough. 10/17/19   [facility], Tami Lin, PA  calcium acetate (PHOSLO) 667 MG capsule Take 1,334 mg by mouth 3 (three)  times daily with meals.     [provider]  carvedilol (COREG) 12.5 MG tablet Take 12.5 mg by mouth 2 (two) times daily with a meal.  01/09/14   [provider]  cetirizine (ZYRTEC) 10 MG tablet Take 10 mg by mouth daily as needed for allergies.     [provider]  clopidogrel (PLAVIX) 75 MG tablet Take 1 tablet (75 mg total) by mouth daily. 01/18/18   Epifanio Lesches, MD  Fluticasone-Salmeterol (ADVAIR) 250-50 MCG/DOSE AEPB Inhale 1 puff into the lungs 2 (two) times daily as needed (for shortness of breath). 09/07/19   Henreitta Leber, MD  hydrALAZINE (APRESOLINE) 50 MG tablet Take 1 tablet (50 mg total) by mouth every 8 (eight) hours. 09/29/19   Danford, Suann Larry, MD  HYDROcodone-acetaminophen (NORCO) 7.5-325 MG tablet Take 1 tablet by mouth every 6 (six) hours as needed (pain). 10/08/19   Danford, Suann Larry, MD  ipratropium-albuterol (DUONEB) 0.5-2.5 (3) MG/3ML SOLN Take 3 mLs by nebulization every 6 (six) hours as needed. 10/17/19   [facility], Tami Lin, PA  lidocaine (LIDODERM) 5 % Place 1 patch onto the  skin daily. Remove & Discard patch within 12 hours or as directed by MD 10/14/19   Flora Lipps, MD  lidocaine-prilocaine (EMLA) cream Apply 1 application topically as needed (prior to treatment).     [provider]  losartan (COZAAR) 50 MG tablet Take 50 mg by mouth daily. 03/18/19   [provider]  ondansetron (ZOFRAN) 4 MG tablet Take 4 mg by mouth every 8 (eight) hours as needed. 05/20/19   [provider]  oxybutynin (DITROPAN XL) 15 MG 24 hr tablet Take 15 mg by mouth daily. 12/24/17   [provider]  pantoprazole (PROTONIX) 40 MG tablet Take 1 tablet (40 mg total) by mouth daily. 10/17/19 10/16/20  Ledora Bottcher, PA  pravastatin (PRAVACHOL) 40 MG tablet Take 40 mg by mouth at bedtime.     [provider]  scopolamine (TRANSDERM-SCOP) 1 MG/3DAYS Place 1 patch onto the skin every 3 (three) days.     [provider]  topiramate (TOPAMAX) 25 MG tablet Take 1 tablet (25 mg total) by mouth as needed (for headaches). 09/07/19   Henreitta Leber, MD  torsemide (DEMADEX) 20 MG tablet Take 2 tablets (40 mg total) by mouth every Monday,Wednesday,Friday, and Sunday at 6 PM. 10/01/19   Danford, Suann Larry, MD    Allergies Enalapril maleate; Nitrofurantoin; Sulfamethoxazole-trimethoprim; 2,4-d dimethylamine (amisol); Baclofen; Neosporin [neomycin-bacitracin zn-polymyx]; Quinine; Ultram [tramadol]; Zocor [simvastatin]; Bactrim [sulfamethoxazole-trimethoprim]; Levodopa; Macrodantin [nitrofurantoin macrocrystal]; and Quinine derivatives  Family History  Problem Relation Age of Onset  . Hypertension Mother   . Hyperlipidemia Mother   . Heart disease Father   . Heart attack Father 44  . Hypertension Father   . Hyperlipidemia Father   . Heart disease Brother        CABG   . Heart attack Brother   . Breast cancer Neg Hx     Social History Social History   Tobacco Use  . Smoking status: Never Smoker  . Smokeless tobacco: Never Used  Substance Use Topics  . Alcohol use: No  . Drug use: No    Review of Systems  Constitutional: No fever/chills Eyes: No visual changes. ENT: No sore throat. Cardiovascular: Denies chest pain. Respiratory: Positive for shortness of breath. Gastrointestinal: No abdominal pain.  No nausea, no vomiting.  No diarrhea.  No constipation. Genitourinary: Negative for dysuria. Musculoskeletal: Negative for back pain. Skin: Negative for rash. Neurological: Negative for headaches, focal weakness or numbness.   ____________________________________________   PHYSICAL EXAM:  VITAL SIGNS: ED Triage Vitals  Enc Vitals Group     BP 10/23/19 0529 (!) 132/43     Pulse Rate 10/23/19 0529 (!) 102     Resp 10/23/19 0529 (!) 30     Temp 10/23/19 0529 98.7 F (37.1 C)     Temp Source 10/23/19 0529 Axillary     SpO2 10/23/19 0529 100 %     Weight 10/23/19  0531 130 lb (59 kg)     Height 10/23/19 0531 4\' 10"  (1.473 m)     Head Circumference --      Peak Flow --      Pain Score 10/23/19 0531 0     Pain Loc --      Pain Edu? --      Excl. in [place]? --     Constitutional: Alert and oriented. Well appearing and in no acute distress. Eyes: Conjunctivae are normal. PERRL. EOMI. Head: Atraumatic. Nose: No congestion/rhinnorhea. Mouth/Throat: Mucous membranes are moist.  Oropharynx non-erythematous. Neck: No stridor.  Cardiovascular: Normal rate, regular rhythm. Grossly normal heart sounds.  Good peripheral circulation. Respiratory: Increased respiratory effort.  Diffuse rales bilaterally.. Gastrointestinal: Soft and nontender. No distention. No abdominal bruits. No CVA tenderness. Musculoskeletal: No lower extremity tenderness nor edema.  No joint effusions. Neurologic:  Normal speech and language. No gross focal neurologic deficits are appreciated.  Skin:  Skin is warm, dry and intact. No rash noted. Psychiatric: Mood and affect are normal. Speech and behavior are normal.  ____________________________________________   LABS (all labs ordered are listed, but only abnormal results are displayed)  Labs Reviewed  CBC WITH DIFFERENTIAL/PLATELET - Abnormal; Notable for the following components:      Result Value   WBC 14.0 (*)    RBC 2.64 (*)    Hemoglobin 8.0 (*)    HCT 25.5 (*)    Neutro Abs 11.8 (*)    Abs Immature Granulocytes 0.12 (*)    All other components within normal limits  COMPREHENSIVE METABOLIC PANEL - Abnormal; Notable for the following components:   Sodium 134 (*)    Chloride 94 (*)    Glucose, Bld 148 (*)    BUN 34 (*)    Creatinine, Ser 6.00 (*)    Calcium 8.8 (*)    Total Protein 6.0 (*)    Albumin 3.4 (*)    GFR calc non Af Amer 6 (*)    GFR calc Af Amer 7 (*)    All other components within normal limits  BLOOD GAS, ARTERIAL - Abnormal; Notable for the following components:   pCO2 arterial 50 (*)    Bicarbonate  31.0 (*)    Acid-Base Excess 5.0 (*)    All other components within normal limits  TROPONIN I (HIGH SENSITIVITY) - Abnormal; Notable for the following components:   Troponin I (High Sensitivity) 31 (*)    All other components within normal limits  SARS CORONAVIRUS 2 BY RT PCR (HOSPITAL ORDER, [place] [facility])  BRAIN NATRIURETIC PEPTIDE   ____________________________________________  EKG  ED ECG REPORT I, Maylynn Orzechowski J, the attending physician, personally viewed and interpreted this ECG.   Date: 10/23/2019  EKG Time: 0529  Rate: 101  Rhythm: sinus tachycardia  Axis: Normal  Intervals:none  ST&T Change: Nonspecific  ____________________________________________  RADIOLOGY  ED MD interpretation: No infiltrates; pulmonary edema  Official radiology report(s): Dg Chest Port 1 View  Result Date: 10/23/2019 CLINICAL DATA:  Shortness of breath and wheezing. EXAM: PORTABLE CHEST 1 VIEW COMPARISON:  Chest x-ray 10/21/2019 FINDINGS: The heart is mildly enlarged but stable. Stable tortuosity and extensive calcification of the thoracic aorta. Mild peribronchial thickening and increased interstitial markings which may suggest bronchitis or reactive airways disease. No definite infiltrates or effusions. IMPRESSION: Stable cardiac enlargement and tortuosity and calcification of the thoracic aorta. Bronchitic changes could suggest bronchitis or interstitial pneumonitis. No focal infiltrates or effusions. Electronically Signed   By: Marijo Sanes M.D.   On: 10/23/2019 06:32    ____________________________________________   PROCEDURES  Procedure(s) performed (including Critical Care):  Procedures  CRITICAL CARE Performed by: Paulette Blanch   Total critical care time: 45 minutes  Critical care time was exclusive of separately billable procedures and treating other patients.  Critical care was necessary to treat or prevent imminent or life-threatening deterioration.   Critical care was time spent personally by me on the following activities: development of treatment plan with patient and/or surrogate as well as nursing, discussions with consultants, evaluation of patient's response to treatment, examination of patient,  obtaining history from patient or surrogate, ordering and performing treatments and interventions, ordering and review of laboratory studies, ordering and review of radiographic studies, pulse oximetry and re-evaluation of patient's condition.  ____________________________________________   INITIAL IMPRESSION / ASSESSMENT AND PLAN / ED COURSE  As part of my medical decision making, I reviewed the following data within the [place] notes reviewed and incorporated, Labs reviewed, EKG interpreted, Old chart reviewed, Radiograph reviewed, Discussed with admitting physician and Notes from prior ED visits     Phyllis [name] was evaluated in Emergency Department on 10/23/2019 for the symptoms described in the history of present illness. She was evaluated in the context of the global COVID-19 pandemic, which necessitated consideration that the patient might be at risk for infection with the SARS-CoV-2 virus that causes COVID-19. Institutional protocols and algorithms that pertain to the evaluation of patients at risk for COVID-19 are in a state of rapid change based on information released by regulatory bodies including the CDC and federal and state organizations. These policies and algorithms were followed during the patient's care in the ED.    73 year old female with ESRD on HD TTS with several recent hospitalizations for respiratory failure presenting with shortness of breath. Differential includes, but is not limited to, viral syndrome, bronchitis including COPD exacerbation, pneumonia, reactive airway disease including asthma, CHF including exacerbation with or without pulmonary/interstitial edema, pneumothorax, ACS,  thoracic trauma, and pulmonary embolism.  Will obtain basic lab work, chest x-ray, rapid Covid PCR swab.  Patient placed immediately on BiPAP.   Clinical Course as of Oct 22 645  Thu Oct 23, 2019  0605 Patient asleep, appears more comfortable on BiPAP.  Will obtain ABG.   [JS]  [id number] Spoke with nephrology Dr. Juleen China.  Patient will be admitted to stepdown unit; however, there are no physical beds will move her to the ED room 17 for dialysis this morning.   [JS]    Clinical Course User Index [JS] Paulette Blanch, MD     ____________________________________________   FINAL CLINICAL IMPRESSION(S) / ED DIAGNOSES  Final diagnoses:  Respiratory distress  Acute pulmonary edema ([place])  ESRD (end stage renal disease) on dialysis [facility])     ED Discharge Orders    None       Note:  This document was prepared using Dragon voice recognition software and may include unintentional dictation errors.   Paulette Blanch, MD 10/23/19 [phone]

## 2019-10-24 ENCOUNTER — Ambulatory Visit: Payer: Medicare Other | Admitting: Nurse Practitioner

## 2019-10-24 MED ORDER — PREDNISONE 20 MG PO TABS
40.0000 mg | ORAL_TABLET | Freq: Two times a day (BID) | ORAL | Status: DC
  Administered 2019-10-24 – 2019-10-25 (×2): 40 mg via ORAL
  Filled 2019-10-24 (×2): qty 2

## 2019-10-24 MED ORDER — IPRATROPIUM BROMIDE HFA 17 MCG/ACT IN AERS
2.0000 | INHALATION_SPRAY | Freq: Four times a day (QID) | RESPIRATORY_TRACT | Status: DC
  Administered 2019-10-24 – 2019-10-25 (×4): 2 via RESPIRATORY_TRACT
  Filled 2019-10-24: qty 12.9

## 2019-10-24 MED ORDER — ALBUTEROL SULFATE HFA 108 (90 BASE) MCG/ACT IN AERS
2.0000 | INHALATION_SPRAY | Freq: Four times a day (QID) | RESPIRATORY_TRACT | Status: DC
  Administered 2019-10-24 – 2019-10-25 (×3): 2 via RESPIRATORY_TRACT
  Filled 2019-10-24: qty 6.7

## 2019-10-24 NOTE — Progress Notes (Signed)
PROGRESS NOTE    Phyllis [name]  [id number] DOB: [birth year] DOA: 10/23/2019 PCP: [name], MD   Brief Narrative:  Per HPI:  Phyllis [name] is a 73 y.o. female with medical history significant of hypertension, hyperlipidemia, COPD, stroke, GERD, gout, anxiety, severe aortic valve stenosis, ESRD-HD (TTS), recent pulmonary edema and PEA arrest, anemia, dCHF, who presents with SOB.  Pt states that her SOB has been worsening the past several days.  She has dry cough but no wheezing.  Oxygen desaturated to 83% on room air per report.  Of note, patient was recently discharged on 3 L oxygen.  She also reports intermittent mild chest discomfort.  No nausea vomiting, diarrhea or abdominal pain.  No symptoms of UTI or unilateral weakness.  Per EDP's note, "patient had a recent hospitalization requiring intubation, made comfort care but upon extubation patient awoke and wished to pursue aggressive care. She was subsequently transferred to [facility] for evaluation of possible TAVR. Heart catheterization 10/14/19 with severe left main and ostial LAD disease.Shewas not a candidate for sternotomy/CABG or high risk PCI.Palliative care team was consulted. She is DNR but no home hospice because she wished to continue hemodialysis". Today I discussed with pt about code status and explained the meaning of CODE STATUS, patient wants to be partial code, OK for intubation, but no CPR).  12/4: Patient has been weaned from BiPAP.  Tolerating 2 to 2.5L nasal cannula.  2 L at home right.  Emergently dialyzed yesterday on presentation due to fluid overload with good result.   Assessment & Plan:   Principal Problem:   Acute on chronic respiratory failure with hypoxia (HCC) Active Problems:   ESRD (end stage renal disease) on dialysis (HCC)   Hyperlipidemia   CVA (cerebral vascular accident) ([place])   Chronic diastolic heart failure (HCC)   HTN (hypertension)   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-stage  renal disease) ([place])   COPD with acute exacerbation (HCC)   Elevated troponin   CAD (coronary artery disease)   COPD exacerbation (HCC)   Leukocytosis   Anxiety  Acute on chronic respiratory failure with hypoxia ([place]): Mainly due to combination of pulmonary edema and COPD exacerbation given cough and wheezing on auscultation, also has other multifactorial etiologies including ESRD and dCHF. Covid19 negative.  Noninvasive positive pressure ventilation weaned off Continue nasal cannula, currently at home rate of 2 L DC IV Solu-Medrol Start prednisone 40 mg p.o. every 12 hours Breathing treatments as needed Nephrology consulted for inpatient hemodialysis Anticipate discharge back to skilled nursing facility tomorrow post dialysis  ESRD (end stage renal disease) on dialysis (TTS): -Dr. Juleen China of renal was consulted for HD  Hyperlipidemia: -Pravastatin  Hx of CVA (cerebral vascular accident) [facility]): -Pravastatin, Plavix  Chronic diastolic heart failure ([place]): 2D echo 09/21/2019 showed EF of 60-85%. Pt seems to have fluid overload -Volume management per renal by dialysis -Patient is on torsemide  HTN (hypertension): -Amlodipine, Coreg, hydralazine, losartan  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-stage renal disease) ([place]): hgb stable, 7.9 on 10/15/19 --> 8.0 today -f/u by CBC  Hx of CAD an elevated troponin: trop minimally elevated at 31 in the setting of ESRD, lower suspicion for heart attack. -continue pravastatin and Plavix  Leukocytosis: WBC 14.0. no fever, possibly reactive. -f/u by CBC  Anxiety: -continue home Xanax  DVT ppx: SQ Heparin      Code Status:  Partial code (OK for intubation, but no CPR). Family Communication: None at bed side.    Disposition Plan:  Anticipate discharge  back to previous SNF environment    Consultants:   Nephrology- [place]  Procedures:   None  Antimicrobials:   None   Subjective: Seen and examined Baseline respiratory status No  acute overnight events No new complaints  Objective: Vitals:   10/24/19 0246 10/24/19 0551 10/24/19 0853 10/24/19 1210  BP: (!) 140/52 (!) 128/47  (!) 152/50  Pulse: 81 76  82  Resp: 20 20  15   Temp: 98.2 F (36.8 C) 97.8 F (36.6 C)  98.6 F (37 C)  TempSrc:    Oral  SpO2: 100% 100% 100% 100%  Weight: 54 kg     Height: 4\' 10"  (1.473 m)       Intake/Output Summary (Last 24 hours) at 10/24/2019 1429 Last data filed at 10/24/2019 [id number] Gross per 24 hour  Intake 0 ml  Output 2350 ml  Net -2350 ml   Filed Weights   10/23/19 0531 10/24/19 0246  Weight: 59 kg 54 kg    Examination:  General: Not in acute distress HEENT:       Eyes: PERRL, EOMI, no scleral icterus.       ENT: No discharge from the ears and nose, no pharynx injection, no tonsillar enlargement.        Neck: No JVD, no bruit, no mass felt. Heme: No neck lymph node enlargement. Cardiac: S1/S2, RRR, No murmurs, No gallops or rubs. Respiratory: decreased air movement bilaterally, has mild wheezing. GI: Soft, nondistended, nontender, no rebound pain, no organomegaly, BS present. GU: No hematuria Ext: has trace leg edema bilaterally. 2+DP/PT pulse bilaterally. Musculoskeletal: No joint deformities, No joint redness or warmth, no limitation of ROM in spin. Skin: No rashes.  Neuro: Alert, oriented X3, cranial nerves II-XII grossly intact, moves all extremities normally. Psych: Patient is not psychotic, no suicidal or hemocidal ideation.    Data Reviewed: I have personally reviewed following labs and imaging studies  CBC: Recent Labs  Lab 10/21/19 0650 10/23/19 0544 10/23/19 1052  WBC 7.9 14.0* 12.1*  NEUTROABS 6.0 11.8*  --   HGB 8.2* 8.0* 7.5*  HCT 25.2* 25.5* 23.8*  MCV 91.0 96.6 95.2  PLT 227 265 [id number]   Basic Metabolic Panel: Recent Labs  Lab 10/21/19 0650 10/23/19 0544  NA 135 134*  K 4.5 4.1  CL 95* 94*  CO2 26 26  GLUCOSE 107* 148*  BUN 35* 34*  CREATININE 6.48* 6.00*  CALCIUM 9.1 8.8*    MG 2.2  --    GFR: Estimated Creatinine Clearance: 6.1 mL/min (A) (by C-G formula based on SCr of 6 mg/dL (H)). Liver Function Tests: Recent Labs  Lab 10/21/19 0650 10/23/19 0544  AST 15 20  ALT 8 8  ALKPHOS 74 79  BILITOT 0.9 0.8  PROT 6.2* 6.0*  ALBUMIN 3.7 3.4*   Recent Labs  Lab 10/21/19 0650  LIPASE 20   No results for input(s): AMMONIA in the last 168 hours. Coagulation Profile: No results for input(s): INR, PROTIME in the last 168 hours. Cardiac Enzymes: No results for input(s): CKTOTAL, CKMB, CKMBINDEX, TROPONINI in the last 168 hours. BNP (last 3 results) No results for input(s): PROBNP in the last 8760 hours. HbA1C: No results for input(s): HGBA1C in the last 72 hours. CBG: No results for input(s): GLUCAP in the last 168 hours. Lipid Profile: No results for input(s): CHOL, HDL, LDLCALC, TRIG, CHOLHDL, LDLDIRECT in the last 72 hours. Thyroid Function Tests: No results for input(s): TSH, T4TOTAL, FREET4, T3FREE, THYROIDAB in the last 72 hours. Anemia Panel:  No results for input(s): VITAMINB12, FOLATE, FERRITIN, TIBC, IRON, RETICCTPCT in the last 72 hours. Sepsis Labs: No results for input(s): PROCALCITON, LATICACIDVEN in the last 168 hours.  Recent Results (from the past 240 hour(s))  SARS Coronavirus 2 by RT PCR (hospital order, performed in [facility] hospital lab) Nasopharyngeal Nasopharyngeal Swab     Status: None   Collection Time: 10/23/19  5:57 AM   Specimen: Nasopharyngeal Swab  Result Value Ref Range Status   SARS Coronavirus 2 NEGATIVE NEGATIVE Final    Comment: (NOTE) SARS-CoV-2 target nucleic acids are NOT DETECTED. The SARS-CoV-2 RNA is generally detectable in upper and lower respiratory specimens during the acute phase of infection. The lowest concentration of SARS-CoV-2 viral copies this assay can detect is 250 copies / mL. A negative result does not preclude SARS-CoV-2 infection and should not be used as the sole basis for treatment or  other patient management decisions.  A negative result may occur with improper specimen collection / handling, submission of specimen other than nasopharyngeal swab, presence of viral mutation(s) within the areas targeted by this assay, and inadequate number of viral copies (<250 copies / mL). A negative result must be combined with clinical observations, patient history, and epidemiological information. Fact Sheet for Patients:   StrictlyIdeas.no Fact Sheet for Healthcare Providers: BankingDealers.co.za This test is not yet approved or cleared  by the [place] FDA and has been authorized for detection and/or diagnosis of SARS-CoV-2 by FDA under an Emergency Use Authorization (EUA).  This EUA will remain in effect (meaning this test can be used) for the duration of the COVID-19 declaration under Section 564(b)(1) of the Act, 21 U.S.C. section 360bbb-3(b)(1), unless the authorization is terminated or revoked sooner. Performed at [facility], [address] [place], [place] [address]   CULTURE, BLOOD (ROUTINE X 2) w Reflex to ID Panel     Status: None (Preliminary result)   Collection Time: 10/23/19 12:45 PM   Specimen: BLOOD  Result Value Ref Range Status   Specimen Description BLOOD RIGHT ANTECUBITAL  Final   Special Requests   Final    BOTTLES DRAWN AEROBIC AND ANAEROBIC Blood Culture adequate volume   Culture   Final    NO GROWTH < 24 HOURS Performed at [facility], [address], [place], [place] [address]    Report Status PENDING  Incomplete  CULTURE, BLOOD (ROUTINE X 2) w Reflex to ID Panel     Status: None (Preliminary result)   Collection Time: 10/23/19 12:46 PM   Specimen: BLOOD  Result Value Ref Range Status   Specimen Description BLOOD WRIST  Final   Special Requests   Final    BOTTLES DRAWN AEROBIC AND ANAEROBIC Blood Culture adequate volume   Culture   Final    NO GROWTH < 24  HOURS Performed at [facility], [address], [place], [place] [address]    Report Status PENDING  Incomplete         Radiology Studies: Dg Chest Port 1 View  Result Date: 10/23/2019 CLINICAL DATA:  Shortness of breath and wheezing. EXAM: PORTABLE CHEST 1 VIEW COMPARISON:  Chest x-ray 10/21/2019 FINDINGS: The heart is mildly enlarged but stable. Stable tortuosity and extensive calcification of the thoracic aorta. Mild peribronchial thickening and increased interstitial markings which may suggest bronchitis or reactive airways disease. No definite infiltrates or effusions. IMPRESSION: Stable cardiac enlargement and tortuosity and calcification of the thoracic aorta. Bronchitic changes could suggest bronchitis or interstitial pneumonitis. No focal  infiltrates or effusions. Electronically Signed   By: Marijo Sanes M.D.   On: 10/23/2019 06:32        Scheduled Meds:  albuterol  2 puff Inhalation Q6H   amLODipine  10 mg Oral Daily   calcium acetate  1,334 mg Oral TID WC   carvedilol  12.5 mg Oral BID WC   clopidogrel  75 mg Oral Daily   dextromethorphan  30 mg Oral BID   And   guaiFENesin  600 mg Oral BID   epoetin (EPOGEN/PROCRIT) injection  10,000 Units Intravenous Q T,Th,Sa-HD   heparin  5,000 Units Subcutaneous Q8H   hydrALAZINE  50 mg Oral Q8H   ipratropium  2 puff Inhalation Q6H   lidocaine  1 patch Transdermal Q24H   loratadine  10 mg Oral Daily   losartan  50 mg Oral Daily   mometasone-formoterol  2 puff Inhalation BID   oxybutynin  15 mg Oral Daily   pantoprazole  40 mg Oral Daily   pravastatin  40 mg Oral QHS   predniSONE  40 mg Oral BID WC   torsemide  40 mg Oral Q M,W,F,Su-1800   Continuous Infusions:   LOS: 1 day    Time spent: 35 minutes    Sidney Ace, MD [facility] Pager [phone]  If 7PM-7AM, please contact night-coverage [email or web address] Password [email or web address] 10/24/2019, 2:29 PM

## 2019-10-24 NOTE — Progress Notes (Signed)
[facility]  ROUNDING NOTE   Subjective:   Hemodialysis treatment yesterday. Tolerated treatment well. UF 2364mL.   Denies any shortness of breath this morning.   Objective:  Vital signs in last 24 hours:  Temp:  [97.8 F (36.6 C)-98.6 F (37 C)] 98.6 F (37 C) (12/04 1210) Pulse Rate:  [75-109] 82 (12/04 1210) Resp:  [0-30] 15 (12/04 1210) BP: (108-172)/(42-84) 152/50 (12/04 1210) SpO2:  [98 %-100 %] 100 % (12/04 1210) Weight:  [54 kg] 54 kg (12/04 0246)  Weight change: -4.968 kg Filed Weights   10/23/19 0531 10/24/19 0246  Weight: 59 kg 54 kg    Intake/Output: I/O last 3 completed shifts: In: 0  Out: 2350 [Other:2350]   Intake/Output this shift:  No intake/output data recorded.  Physical Exam: General: Critically Ill  Head: BIPAP   Eyes: Anicteric, PERRL  Neck: Supple, trachea midline  Lungs:  Bilateral crackles  Heart: Regular rate and rhythm  Abdomen:  Soft, nontender, obese  Extremities:  +peripheral edema.  Neurologic: Nonfocal, moving all four extremities  Skin: No lesions  Access: Left AVF    Basic Metabolic Panel: Recent Labs  Lab 10/21/19 0650 10/23/19 0544  NA 135 134*  K 4.5 4.1  CL 95* 94*  CO2 26 26  GLUCOSE 107* 148*  BUN 35* 34*  CREATININE 6.48* 6.00*  CALCIUM 9.1 8.8*  MG 2.2  --     Liver Function Tests: Recent Labs  Lab 10/21/19 0650 10/23/19 0544  AST 15 20  ALT 8 8  ALKPHOS 74 79  BILITOT 0.9 0.8  PROT 6.2* 6.0*  ALBUMIN 3.7 3.4*   Recent Labs  Lab 10/21/19 0650  LIPASE 20   No results for input(s): AMMONIA in the last 168 hours.  CBC: Recent Labs  Lab 10/21/19 0650 10/23/19 0544 10/23/19 1052  WBC 7.9 14.0* 12.1*  NEUTROABS 6.0 11.8*  --   HGB 8.2* 8.0* 7.5*  HCT 25.2* 25.5* 23.8*  MCV 91.0 96.6 95.2  PLT 227 265 189    Cardiac Enzymes: No results for input(s): CKTOTAL, CKMB, CKMBINDEX, TROPONINI in the last 168 hours.  BNP: Invalid input(s): POCBNP  CBG: No results for  input(s): GLUCAP in the last 168 hours.  Microbiology: Results for orders placed or performed during the hospital encounter of 10/23/19  SARS Coronavirus 2 by RT PCR (hospital order, performed in [facility] hospital lab) Nasopharyngeal Nasopharyngeal Swab     Status: None   Collection Time: 10/23/19  5:57 AM   Specimen: Nasopharyngeal Swab  Result Value Ref Range Status   SARS Coronavirus 2 NEGATIVE NEGATIVE Final    Comment: (NOTE) SARS-CoV-2 target nucleic acids are NOT DETECTED. The SARS-CoV-2 RNA is generally detectable in upper and lower respiratory specimens during the acute phase of infection. The lowest concentration of SARS-CoV-2 viral copies this assay can detect is 250 copies / mL. A negative result does not preclude SARS-CoV-2 infection and should not be used as the sole basis for treatment or other patient management decisions.  A negative result may occur with improper specimen collection / handling, submission of specimen other than nasopharyngeal swab, presence of viral mutation(s) within the areas targeted by this assay, and inadequate number of viral copies (<250 copies / mL). A negative result must be combined with clinical observations, patient history, and epidemiological information. Fact Sheet for Patients:   StrictlyIdeas.no Fact Sheet for Healthcare Providers: BankingDealers.co.za This test is not yet approved or cleared  by the [place] FDA and has  been authorized for detection and/or diagnosis of SARS-CoV-2 by FDA under an Emergency Use Authorization (EUA).  This EUA will remain in effect (meaning this test can be used) for the duration of the COVID-19 declaration under Section 564(b)(1) of the Act, 21 U.S.C. section 360bbb-3(b)(1), unless the authorization is terminated or revoked sooner. Performed at [facility], [address] [place], [place] [address]   CULTURE, BLOOD (ROUTINE X 2) w  Reflex to ID Panel     Status: None (Preliminary result)   Collection Time: 10/23/19 12:45 PM   Specimen: BLOOD  Result Value Ref Range Status   Specimen Description BLOOD RIGHT ANTECUBITAL  Final   Special Requests   Final    BOTTLES DRAWN AEROBIC AND ANAEROBIC Blood Culture adequate volume   Culture   Final    NO GROWTH < 24 HOURS Performed at [facility], [address], [place], [place] [address]    Report Status PENDING  Incomplete  CULTURE, BLOOD (ROUTINE X 2) w Reflex to ID Panel     Status: None (Preliminary result)   Collection Time: 10/23/19 12:46 PM   Specimen: BLOOD  Result Value Ref Range Status   Specimen Description BLOOD WRIST  Final   Special Requests   Final    BOTTLES DRAWN AEROBIC AND ANAEROBIC Blood Culture adequate volume   Culture   Final    NO GROWTH < 24 HOURS Performed at [facility], [address] [place], [place] [address]    Report Status PENDING  Incomplete    Coagulation Studies: No results for input(s): LABPROT, INR in the last 72 hours.  Urinalysis: No results for input(s): COLORURINE, LABSPEC, PHURINE, GLUCOSEU, HGBUR, BILIRUBINUR, KETONESUR, PROTEINUR, UROBILINOGEN, NITRITE, LEUKOCYTESUR in the last 72 hours.  Invalid input(s): APPERANCEUR    Imaging: Dg Chest Port 1 View  Result Date: 10/23/2019 CLINICAL DATA:  Shortness of breath and wheezing. EXAM: PORTABLE CHEST 1 VIEW COMPARISON:  Chest x-ray 10/21/2019 FINDINGS: The heart is mildly enlarged but stable. Stable tortuosity and extensive calcification of the thoracic aorta. Mild peribronchial thickening and increased interstitial markings which may suggest bronchitis or reactive airways disease. No definite infiltrates or effusions. IMPRESSION: Stable cardiac enlargement and tortuosity and calcification of the thoracic aorta. Bronchitic changes could suggest bronchitis or interstitial pneumonitis. No focal infiltrates or effusions. Electronically Signed   By: Marijo Sanes M.D.   On: 10/23/2019 06:32     Medications:    . amLODipine  10 mg Oral Daily  . calcium acetate  1,334 mg Oral TID WC  . carvedilol  12.5 mg Oral BID WC  . clopidogrel  75 mg Oral Daily  . dextromethorphan  30 mg Oral BID   And  . guaiFENesin  600 mg Oral BID  . epoetin (EPOGEN/PROCRIT) injection  10,000 Units Intravenous Q T,Th,Sa-HD  . heparin  5,000 Units Subcutaneous Q8H  . hydrALAZINE  50 mg Oral Q8H  . ipratropium  2 puff Inhalation Q6H  . levalbuterol  1.25 mg Nebulization Q6H  . lidocaine  1 patch Transdermal Q24H  . loratadine  10 mg Oral Daily  . losartan  50 mg Oral Daily  . mometasone-formoterol  2 puff Inhalation BID  . oxybutynin  15 mg Oral Daily  . pantoprazole  40 mg Oral Daily  . pravastatin  40 mg Oral QHS  . predniSONE  40 mg Oral BID WC  . torsemide  40 mg Oral Q M,W,F,Su-1800     Assessment/ Plan:  [name] is a 73 y.o. white female with end stage renal disease on hemodialysis, severe aortic stenosis, hypertension, diabetes mellitus type II, COPD, congestive heart failure, coronary artery disease, chronic costochondritis, who is admitted to [facility] on 10/23/2019 for acute respiratory failure with pulmonary edema requiring noninvasive mechanical ventilation  [facility] TTS [address] 58kg Left AVF  1. End Stage Renal Disease: with pulmonary edema and volume overload on admission requiring emergent hemodialysis treatment.  - Resume TTS schedule.   2. Acute respiratory failure requiring noninvasive mechanical ventilation on admission - Continue supportive care  3. Anemia of chronic kidney disease: hemoglobin 7.5 - EPO with HD treatment  4. Hypertension: Currently blood pressure readings are at goal.   5. Secondary Hyperparathyroidism:  - calcium acetate when stable.    LOS: 1 Phyllis [name] 12/4/202012:11 PM

## 2019-10-24 NOTE — ED Notes (Signed)
ED TO INPATIENT HANDOFF REPORT  ED Nurse Name and Phone #: Lequita Asal Name/Age/Gender Phyllis [name] 73 y.o. female Room/Bed: ED17A/ED17A  Code Status   Code Status: DNR  Home/SNF/Other Skilled nursing facility Patient oriented to: self Is this baseline? Yes   Triage Complete: Triage complete  Chief Complaint [facility]  Triage Note Pt presents w/ audible wheezing, rhonchi, and rales. Pt administered albuterol neb x 1 by EMS. Pt reported to have RA SAO2=83% at [facility]. Pt - for COVID, last tested on 10/22/2019.    Allergies Allergies  Allergen Reactions  . Enalapril Maleate Other (See Comments)    Other reaction(s): Headache  . Nitrofurantoin Swelling and Rash    Other Reaction: swelling of body  . Sulfamethoxazole-Trimethoprim Swelling  . 2,4-D Dimethylamine (Amisol) Rash and Other (See Comments)    Other Reaction: h/a  . Baclofen Other (See Comments) and Nausea Only    lightheadness ,drowsiness , muscle weakness , twitching in hands   . Neosporin [Neomycin-Bacitracin Zn-Polymyx] Other (See Comments) and Rash    Other Reaction: irritation Skin irritation   . Quinine Nausea And Vomiting, Rash and Other (See Comments)    Other Reaction: Vomiting, rash, h/a, vision  . Ultram [Tramadol] Palpitations  . Zocor [Simvastatin] Other (See Comments) and Rash    Other Reaction: muscle spasms Muscle pain and spasms  . Bactrim [Sulfamethoxazole-Trimethoprim] Swelling  . Levodopa Other (See Comments)    Reaction: unknown  . Macrodantin [Nitrofurantoin Macrocrystal] Swelling  . Quinine Derivatives Other (See Comments)    Vertigo,nausea vomiting blurred vision headache ears sensitivity     Level of Care/Admitting Diagnosis ED Disposition    ED Disposition Condition [place]: [place] [[id number]]  Level of Care: Med-Surg [16]  Covid Evaluation: Confirmed COVID Negative  Diagnosis: COPD exacerbation [facility]) [id number]   Admitting Physician: Ivor Costa [[id number]]  Attending Physician: Ivor Costa [phone]  Estimated length of stay: past midnight tomorrow  Certification:: I certify this patient will need inpatient services for at least 2 midnights  PT Class (Do Not Modify): Inpatient [101]  PT Acc Code (Do Not Modify): Private [1]       B Medical/Surgery History Past Medical History:  Diagnosis Date  . (HFpEF) heart failure with preserved ejection fraction ([place])    a. TTE 01/2014: nl LV sys fxn, no valvular abnormalities; b. TTE 11/16: nl EF, mild LVH;  c. 04/2019 Echo: EF 60-65%. DD. Nl RV fxn. Mod AS. 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  . Allergy   . Anemia of chronic disease   . Anxiety   . Aortic atherosclerosis ([place])   . Asthma   . Chronic back pain   . COPD (chronic obstructive pulmonary disease) ([place])   . Diabetes mellitus with complication ([place])   . ESRD on hemodialysis ([place])    a. Tues/Sat; b. 2/2 small kidneys  . Essential hypertension   . Fistula    lower left arm  . GERD (gastroesophageal reflux disease)   . Gout   . History of exercise stress test    a. 01/2014: no evidence of ischemia; b. Lexiscan 08/2015: no sig ischemia, severe GI uptake artifact, low risk; c. CPET @ [facility] 09/2016: exercised 3 min 12 sec on bike without incline, 2.28 METs, VO2 of 8.1, 48% of predicted, indicating mod to sev functional impairment, evidence of blunted HR, stroke volume, and BP augmentation as well as ventilation-perfusion mismatch with exercise  . HLD (hyperlipidemia)   .  Non-obstructive Carotid arterial disease ([place])    a. 12/2017: <50% bilat ICA dzs.  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 Severe aortic stenosis    a. by 09/2019 echo b. 04/2019 Echo: Mod AS.  Marland Kitchen Sleep apnea    Past Surgical History:  Procedure Laterality Date  . carpel tunnel    . GALLBLADDER SURGERY    . PERIPHERAL VASCULAR CATHETERIZATION N/A 04/12/2015   Procedure: A/V Shuntogram/Fistulagram;   Surgeon: Algernon Huxley, MD;  Location: [facility];  Service: Cardiovascular;  Laterality: N/A;  . PERIPHERAL VASCULAR CATHETERIZATION N/A 04/12/2015   Procedure: A/V Shunt Intervention;  Surgeon: Algernon Huxley, MD;  Location: [facility];  Service: Cardiovascular;  Laterality: N/A;  . PERIPHERAL VASCULAR CATHETERIZATION N/A 06/09/2015   Procedure: Dialysis/Perma Catheter Removal;  Surgeon: Katha Cabal, MD;  Location: [facility];  Service: Cardiovascular;  Laterality: N/A;  . RIGHT/LEFT HEART CATH AND CORONARY ANGIOGRAPHY N/A 10/14/2019   Procedure: RIGHT/LEFT HEART CATH AND CORONARY ANGIOGRAPHY;  Surgeon: Belva Crome, MD;  Location: [facility];  Service: Cardiovascular;  Laterality: N/A;     A IV Location/Drains/Wounds Patient Lines/Drains/Airways Status   Active Line/Drains/Airways    Name:   Placement date:   Placement time:   Site:   Days:   Peripheral IV 10/23/19 Right Wrist   10/23/19    1800    Wrist   1   Fistula / Graft Left Forearm Arteriovenous fistula   -    -    Forearm      External Urinary Catheter   10/23/19    2059    -   1          Intake/Output Last 24 hours  Intake/Output Summary (Last 24 hours) at 10/24/2019 0136 Last data filed at 10/23/2019 1700 Gross per 24 hour  Intake -  Output 2350 ml  Net -2350 ml    Labs/Imaging Results for orders placed or performed during the hospital encounter of 10/23/19 (from the past 48 hour(s))  CBC with Differential     Status: Abnormal   Collection Time: 10/23/19  5:44 AM  Result Value Ref Range   WBC 14.0 (H) 4.0 - 10.5 K/uL   RBC 2.64 (L) 3.87 - 5.11 MIL/uL   Hemoglobin 8.0 (L) 12.0 - 15.0 g/dL   HCT 25.5 (L) 36.0 - 46.0 %   MCV 96.6 80.0 - 100.0 fL   MCH 30.3 26.0 - 34.0 pg   MCHC 31.4 30.0 - 36.0 g/dL   RDW 14.6 11.5 - 15.5 %   Platelets 265 150 - 400 K/uL   nRBC 0.0 0.0 - 0.2 %   Neutrophils Relative % 84 %   Neutro Abs 11.8 (H) 1.7 - 7.7 K/uL   Lymphocytes Relative 7 %    Lymphs Abs 1.0 0.7 - 4.0 K/uL   Monocytes Relative 5 %   Monocytes Absolute 0.7 0.1 - 1.0 K/uL   Eosinophils Relative 3 %   Eosinophils Absolute 0.4 0.0 - 0.5 K/uL   Basophils Relative 0 %   Basophils Absolute 0.0 0.0 - 0.1 K/uL   Immature Granulocytes 1 %   Abs Immature Granulocytes 0.12 (H) 0.00 - 0.07 K/uL    Comment: Performed at [facility], [address], [place], [place] [address]  Comprehensive metabolic panel     Status: Abnormal   Collection Time: 10/23/19  5:44 AM  Result Value Ref Range  Sodium 134 (L) 135 - 145 mmol/L   Potassium 4.1 3.5 - 5.1 mmol/L    Comment: HEMOLYSIS AT THIS LEVEL MAY AFFECT RESULT   Chloride 94 (L) 98 - 111 mmol/L   CO2 26 22 - 32 mmol/L   Glucose, Bld 148 (H) 70 - 99 mg/dL   BUN 34 (H) 8 - 23 mg/dL   Creatinine, Ser 6.00 (H) 0.44 - 1.00 mg/dL   Calcium 8.8 (L) 8.9 - 10.3 mg/dL   Total Protein 6.0 (L) 6.5 - 8.1 g/dL   Albumin 3.4 (L) 3.5 - 5.0 g/dL   AST 20 15 - 41 U/L   ALT 8 0 - 44 U/L   Alkaline Phosphatase 79 38 - 126 U/L   Total Bilirubin 0.8 0.3 - 1.2 mg/dL   GFR calc non Af Amer 6 (L) >60 mL/min   GFR calc Af Amer 7 (L) >60 mL/min   Anion gap 14 5 - 15    Comment: Performed at [facility], [address] [place], [name] [address]  Brain natriuretic peptide     Status: Abnormal   Collection Time: 10/23/19  5:44 AM  Result Value Ref Range   B Natriuretic Peptide 1,573.0 (H) 0.0 - 100.0 pg/mL    Comment: Performed at [facility], [place], [place] [address]  Troponin I (High Sensitivity)     Status: Abnormal   Collection Time: 10/23/19  5:44 AM  Result Value Ref Range   Troponin I (High Sensitivity) 31 (H) <18 ng/L    Comment: (NOTE) Elevated high sensitivity troponin I (hsTnI) values and significant  changes across serial measurements may suggest ACS but many other  chronic and acute conditions are known to elevate hsTnI results.  Refer to the "Links" section for chest pain  algorithms and additional  guidance. Performed at [facility], [address] [place], [place] [address]   Blood gas, arterial     Status: Abnormal   Collection Time: 10/23/19  5:57 AM  Result Value Ref Range   FIO2 0.40    Delivery systems BILEVEL POSITIVE AIRWAY PRESSURE    Inspiratory PAP 12    Expiratory PAP 6.0    pH, Arterial 7.40 7.350 - 7.450   pCO2 arterial 50 (H) 32.0 - 48.0 mmHg   pO2, Arterial 107 83.0 - 108.0 mmHg   Bicarbonate 31.0 (H) 20.0 - 28.0 mmol/L   Acid-Base Excess 5.0 (H) 0.0 - 2.0 mmol/L   O2 Saturation 98.1 %   Patient temperature 37.0    Collection site RIGHT RADIAL    Sample type ARTERIAL DRAW    Allens test (pass/fail) PASS PASS    Comment: Performed at [facility], [address], [place], [place] [address]  SARS Coronavirus 2 by RT PCR (hospital order, performed in [facility] hospital lab) Nasopharyngeal Nasopharyngeal Swab     Status: None   Collection Time: 10/23/19  5:57 AM   Specimen: Nasopharyngeal Swab  Result Value Ref Range   SARS Coronavirus 2 NEGATIVE NEGATIVE    Comment: (NOTE) SARS-CoV-2 target nucleic acids are NOT DETECTED. The SARS-CoV-2 RNA is generally detectable in upper and lower respiratory specimens during the acute phase of infection. The lowest concentration of SARS-CoV-2 viral copies this assay can detect is 250 copies / mL. A negative result does not preclude SARS-CoV-2 infection and should not be used as the sole basis for treatment or other patient management decisions.  A negative result may occur with improper specimen collection /  handling, submission of specimen other than nasopharyngeal swab, presence of viral mutation(s) within the areas targeted by this assay, and inadequate number of viral copies (<250 copies / mL). A negative result must be combined with clinical observations, patient history, and epidemiological information. Fact Sheet for Patients:    StrictlyIdeas.no Fact Sheet for Healthcare Providers: BankingDealers.co.za This test is not yet approved or cleared  by the [place] FDA and has been authorized for detection and/or diagnosis of SARS-CoV-2 by FDA under an Emergency Use Authorization (EUA).  This EUA will remain in effect (meaning this test can be used) for the duration of the COVID-19 declaration under Section 564(b)(1) of the Act, 21 U.S.C. section 360bbb-3(b)(1), unless the authorization is terminated or revoked sooner. Performed at [facility], [place], [place] [address]   Troponin I (High Sensitivity)     Status: Abnormal   Collection Time: 10/23/19  8:38 AM  Result Value Ref Range   Troponin I (High Sensitivity) 48 (H) <18 ng/L    Comment: (NOTE) Elevated high sensitivity troponin I (hsTnI) values and significant  changes across serial measurements may suggest ACS but many other  chronic and acute conditions are known to elevate hsTnI results.  Refer to the "Links" section for chest pain algorithms and additional  guidance. Performed at [facility], [address] [place], [place] [address]   CBC     Status: Abnormal   Collection Time: 10/23/19 10:52 AM  Result Value Ref Range   WBC 12.1 (H) 4.0 - 10.5 K/uL   RBC 2.50 (L) 3.87 - 5.11 MIL/uL   Hemoglobin 7.5 (L) 12.0 - 15.0 g/dL   HCT 23.8 (L) 36.0 - 46.0 %   MCV 95.2 80.0 - 100.0 fL   MCH 30.0 26.0 - 34.0 pg   MCHC 31.5 30.0 - 36.0 g/dL   RDW 14.5 11.5 - 15.5 %   Platelets 189 150 - 400 K/uL   nRBC 0.0 0.0 - 0.2 %    Comment: Performed at [facility], [address] [place], [place] [address]  HIV Antibody (routine testing w rflx)     Status: None   Collection Time: 10/23/19 12:45 PM  Result Value Ref Range   HIV Screen 4th Generation wRfx NON REACTIVE NON REACTIVE    Comment: Performed at [facility], [place] [address], [place], [place]  [address]   Dg Chest Port 1 View  Result Date: 10/23/2019 CLINICAL DATA:  Shortness of breath and wheezing. EXAM: PORTABLE CHEST 1 VIEW COMPARISON:  Chest x-ray 10/21/2019 FINDINGS: The heart is mildly enlarged but stable. Stable tortuosity and extensive calcification of the thoracic aorta. Mild peribronchial thickening and increased interstitial markings which may suggest bronchitis or reactive airways disease. No definite infiltrates or effusions. IMPRESSION: Stable cardiac enlargement and tortuosity and calcification of the thoracic aorta. Bronchitic changes could suggest bronchitis or interstitial pneumonitis. No focal infiltrates or effusions. Electronically Signed   By: Marijo Sanes M.D.   On: 10/23/2019 06:32    Pending Labs Unresulted Labs (From admission, onward)    Start     Ordered   10/23/19 1228  CULTURE, BLOOD (ROUTINE X 2) w Reflex to ID Panel  BLOOD CULTURE X 2,   STAT     10/23/19 1227   10/23/19 1228  Culture, sputum-assessment  Once,   STAT     10/23/19 1227          Vitals/Pain Today's Vitals   10/23/19 2345 10/24/19 0000  10/24/19 0015 10/24/19 0030  BP: (!) 138/46 (!) 122/45 (!) 115/43 (!) 113/42  Pulse: 82 79 80 79  Resp: 16 18 19 19   Temp:      TempSrc:      SpO2: 100% 100% 100% 100%  Weight:      Height:      PainSc:        Isolation Precautions No active isolations  Medications Medications  levalbuterol (XOPENEX) nebulizer solution 1.25 mg (1.25 mg Nebulization Refused 10/23/19 1800)  methylPREDNISolone sodium succinate (SOLU-MEDROL) 40 mg/mL injection 40 mg (40 mg Intravenous Given 10/23/19 2024)  heparin injection 5,000 Units (5,000 Units Subcutaneous Given 10/23/19 2327)  ondansetron (ZOFRAN) injection 4 mg (has no administration in time range)  acetaminophen (TYLENOL) suppository 650 mg (has no administration in time range)  hydrALAZINE (APRESOLINE) tablet 25 mg (has no administration in time range)  dextromethorphan (DELSYM) 30 MG/5ML liquid 30 mg  (30 mg Oral Given 10/23/19 2329)    And  guaiFENesin (MUCINEX) 12 hr tablet 600 mg (600 mg Oral Given 10/23/19 2327)  epoetin alfa (EPOGEN) injection 10,000 Units (10,000 Units Intravenous Given 10/23/19 1408)  HYDROcodone-acetaminophen (NORCO) 7.5-325 MG per tablet 1 tablet (has no administration in time range)  amLODipine (NORVASC) tablet 10 mg (10 mg Oral Given 10/23/19 1801)  carvedilol (COREG) tablet 12.5 mg (12.5 mg Oral Given 10/23/19 1801)  hydrALAZINE (APRESOLINE) tablet 50 mg (50 mg Oral Given 10/23/19 2328)  losartan (COZAAR) tablet 50 mg (50 mg Oral Given 10/23/19 1800)  pravastatin (PRAVACHOL) tablet 40 mg (40 mg Oral Given 10/23/19 2329)  torsemide (DEMADEX) tablet 40 mg (has no administration in time range)  ALPRAZolam (XANAX) tablet 0.25 mg (0.25 mg Oral Given 10/23/19 2327)  calcium acetate (PHOSLO) capsule 1,334 mg (1,334 mg Oral Given 10/23/19 2029)  pantoprazole (PROTONIX) EC tablet 40 mg (40 mg Oral Given 10/23/19 1800)  oxybutynin (DITROPAN-XL) 24 hr tablet 15 mg (has no administration in time range)  clopidogrel (PLAVIX) tablet 75 mg (75 mg Oral Given 10/23/19 1801)  topiramate (TOPAMAX) tablet 25 mg (has no administration in time range)  loratadine (CLARITIN) tablet 10 mg (10 mg Oral Given 10/23/19 1801)  mometasone-formoterol (DULERA) 200-5 MCG/ACT inhaler 2 puff (2 puffs Inhalation Given 10/23/19 2027)  lidocaine (LIDODERM) 5 % 1 patch (1 patch Transdermal Refused 10/23/19 1802)  ipratropium (ATROVENT) nebulizer solution 0.5 mg (has no administration in time range)    Mobility non-ambulatory Moderate fall risk   Focused Assessments Pulmonary Assessment Handoff:  Lung sounds: Bilateral Breath Sounds: (wet sounds, more esophogeal than in lungs) O2 Device: Nasal Cannula O2 Flow Rate (L/min): 3 L/min      R Recommendations: See Admitting Provider Note  Report given to:   Additional Notes:

## 2019-10-24 NOTE — Progress Notes (Signed)
Patient has a pending referral for [place] program at [facility]. CMRN Isaias Cowman made aware. Flo Shanks BSN, RN, [place] [phone]

## 2019-10-25 LAB — CBC
HCT: 23.8 % — ABNORMAL LOW (ref 36.0–46.0)
Hemoglobin: 7.6 g/dL — ABNORMAL LOW (ref 12.0–15.0)
MCH: 29.5 pg (ref 26.0–34.0)
MCHC: 31.9 g/dL (ref 30.0–36.0)
MCV: 92.2 fL (ref 80.0–100.0)
Platelets: 191 10*3/uL (ref 150–400)
RBC: 2.58 MIL/uL — ABNORMAL LOW (ref 3.87–5.11)
RDW: 14.5 % (ref 11.5–15.5)
WBC: 9.5 10*3/uL (ref 4.0–10.5)
nRBC: 0 % (ref 0.0–0.2)

## 2019-10-25 LAB — RENAL FUNCTION PANEL
Albumin: 3.6 g/dL (ref 3.5–5.0)
Anion gap: 12 (ref 5–15)
BUN: 50 mg/dL — ABNORMAL HIGH (ref 8–23)
CO2: 29 mmol/L (ref 22–32)
Calcium: 8.8 mg/dL — ABNORMAL LOW (ref 8.9–10.3)
Chloride: 95 mmol/L — ABNORMAL LOW (ref 98–111)
Creatinine, Ser: 5.57 mg/dL — ABNORMAL HIGH (ref 0.44–1.00)
GFR calc Af Amer: 8 mL/min — ABNORMAL LOW (ref >60)
GFR calc non Af Amer: 7 mL/min — ABNORMAL LOW (ref >60)
Glucose, Bld: 176 mg/dL — ABNORMAL HIGH (ref 70–99)
Phosphorus: 3.1 mg/dL (ref 2.5–4.6)
Potassium: 4 mmol/L (ref 3.5–5.1)
Sodium: 136 mmol/L (ref 135–145)

## 2019-10-25 MED ORDER — ACETAMINOPHEN 500 MG PO TABS
500.0000 mg | ORAL_TABLET | Freq: Four times a day (QID) | ORAL | Status: DC | PRN
  Administered 2019-10-25: 500 mg via ORAL
  Filled 2019-10-25: qty 1

## 2019-10-25 NOTE — Progress Notes (Addendum)
[facility]  ROUNDING NOTE   Subjective:   Hemodialysis for later today. Continues to be on isolation for COVID positive test on 11/21  Objective:  Vital signs in last 24 hours:  Temp:  [98.4 F (36.9 C)-98.6 F (37 C)] 98.4 F (36.9 C) (12/05 1211) Pulse Rate:  [74-83] 82 (12/05 1211) Resp:  [14-20] 14 (12/05 1211) BP: (132-150)/(47-60) 150/51 (12/05 1211) SpO2:  [100 %] 100 % (12/05 1211)  Weight change:  Filed Weights   10/23/19 0531 10/24/19 0246  Weight: 59 kg 54 kg    Intake/Output: No intake/output data recorded.   Intake/Output this shift:  No intake/output data recorded.  Physical Exam: Physical examination deferred in the setting of active COVID-19 infection in order to limit the spread of COVID-19 as well as to preserve PPE.  Basic Metabolic Panel: Recent Labs  Lab 10/21/19 0650 10/23/19 0544 10/25/19 0859  NA 135 134* 136  K 4.5 4.1 4.0  CL 95* 94* 95*  CO2 26 26 29   GLUCOSE 107* 148* 176*  BUN 35* 34* 50*  CREATININE 6.48* 6.00* 5.57*  CALCIUM 9.1 8.8* 8.8*  MG 2.2  --   --   PHOS  --   --  3.1    Liver Function Tests: Recent Labs  Lab 10/21/19 0650 10/23/19 0544 10/25/19 0859  AST 15 20  --   ALT 8 8  --   ALKPHOS 74 79  --   BILITOT 0.9 0.8  --   PROT 6.2* 6.0*  --   ALBUMIN 3.7 3.4* 3.6   Recent Labs  Lab 10/21/19 0650  LIPASE 20   No results for input(s): AMMONIA in the last 168 hours.  CBC: Recent Labs  Lab 10/21/19 0650 10/23/19 0544 10/23/19 1052 10/25/19 0859  WBC 7.9 14.0* 12.1* 9.5  NEUTROABS 6.0 11.8*  --   --   HGB 8.2* 8.0* 7.5* 7.6*  HCT 25.2* 25.5* 23.8* 23.8*  MCV 91.0 96.6 95.2 92.2  PLT 227 265 189 191    Cardiac Enzymes: No results for input(s): CKTOTAL, CKMB, CKMBINDEX, TROPONINI in the last 168 hours.  BNP: Invalid input(s): POCBNP  CBG: No results for input(s): GLUCAP in the last 168 hours.  Microbiology: Results for orders placed or performed during the hospital encounter  of 10/23/19  SARS Coronavirus 2 by RT PCR (hospital order, performed in [facility] hospital lab) Nasopharyngeal Nasopharyngeal Swab     Status: None   Collection Time: 10/23/19  5:57 AM   Specimen: Nasopharyngeal Swab  Result Value Ref Range Status   SARS Coronavirus 2 NEGATIVE NEGATIVE Final    Comment: (NOTE) SARS-CoV-2 target nucleic acids are NOT DETECTED. The SARS-CoV-2 RNA is generally detectable in upper and lower respiratory specimens during the acute phase of infection. The lowest concentration of SARS-CoV-2 viral copies this assay can detect is 250 copies / mL. A negative result does not preclude SARS-CoV-2 infection and should not be used as the sole basis for treatment or other patient management decisions.  A negative result may occur with improper specimen collection / handling, submission of specimen other than nasopharyngeal swab, presence of viral mutation(s) within the areas targeted by this assay, and inadequate number of viral copies (<250 copies / mL). A negative result must be combined with clinical observations, patient history, and epidemiological information. Fact Sheet for Patients:   StrictlyIdeas.no Fact Sheet for Healthcare Providers: BankingDealers.co.za This test is not yet approved or cleared  by the [place] FDA and has been  authorized for detection and/or diagnosis of SARS-CoV-2 by FDA under an Emergency Use Authorization (EUA).  This EUA will remain in effect (meaning this test can be used) for the duration of the COVID-19 declaration under Section 564(b)(1) of the Act, 21 U.S.C. section 360bbb-3(b)(1), unless the authorization is terminated or revoked sooner. Performed at [facility], [address] [place], [place] [address]   CULTURE, BLOOD (ROUTINE X 2) w Reflex to ID Panel     Status: None (Preliminary result)   Collection Time: 10/23/19 12:45 PM   Specimen: BLOOD  Result Value  Ref Range Status   Specimen Description BLOOD RIGHT ANTECUBITAL  Final   Special Requests   Final    BOTTLES DRAWN AEROBIC AND ANAEROBIC Blood Culture adequate volume   Culture   Final    NO GROWTH 2 DAYS Performed at [facility], [address], [place], [place] [address]    Report Status PENDING  Incomplete  CULTURE, BLOOD (ROUTINE X 2) w Reflex to ID Panel     Status: None (Preliminary result)   Collection Time: 10/23/19 12:46 PM   Specimen: BLOOD  Result Value Ref Range Status   Specimen Description BLOOD WRIST  Final   Special Requests   Final    BOTTLES DRAWN AEROBIC AND ANAEROBIC Blood Culture adequate volume   Culture   Final    NO GROWTH 2 DAYS Performed at [facility], [address], [place],  [address]    Report Status PENDING  Incomplete    Coagulation Studies: No results for input(s): LABPROT, INR in the last 72 hours.  Urinalysis: No results for input(s): COLORURINE, LABSPEC, PHURINE, GLUCOSEU, HGBUR, BILIRUBINUR, KETONESUR, PROTEINUR, UROBILINOGEN, NITRITE, LEUKOCYTESUR in the last 72 hours.  Invalid input(s): APPERANCEUR    Imaging: No results found.   Medications:    . albuterol  2 puff Inhalation Q6H  . amLODipine  10 mg Oral Daily  . calcium acetate  1,334 mg Oral TID WC  . carvedilol  12.5 mg Oral BID WC  . clopidogrel  75 mg Oral Daily  . dextromethorphan  30 mg Oral BID   And  . guaiFENesin  600 mg Oral BID  . epoetin (EPOGEN/PROCRIT) injection  10,000 Units Intravenous Q T,Th,Sa-HD  . heparin  5,000 Units Subcutaneous Q8H  . hydrALAZINE  50 mg Oral Q8H  . ipratropium  2 puff Inhalation Q6H  . lidocaine  1 patch Transdermal Q24H  . loratadine  10 mg Oral Daily  . losartan  50 mg Oral Daily  . mometasone-formoterol  2 puff Inhalation BID  . oxybutynin  15 mg Oral Daily  . pantoprazole  40 mg Oral Daily  . pravastatin  40 mg Oral QHS  . predniSONE  40 mg Oral BID WC  . torsemide  40 mg Oral Q M,W,F,Su-1800      Assessment/ Plan:  [name] is a 73 y.o. white female with end stage renal disease on hemodialysis, severe aortic stenosis, hypertension, diabetes mellitus type II, COPD, congestive heart failure, coronary artery disease, chronic costochondritis, who is admitted to [facility] on 10/23/2019 for acute respiratory failure with pulmonary edema requiring noninvasive mechanical ventilation  [facility] TTS [address] 58kg Left AVF  1. End Stage Renal Disease:  - Resume TTS schedule. Dialysis for later today, orders prepared.   2. Acute respiratory failure requiring noninvasive mechanical ventilation on admission - Continue supportive care  3. Anemia of chronic kidney disease: hemoglobin 7.6 - EPO with HD  treatment  4. Hypertension: Currently blood pressure readings are at goal.   5. Secondary Hyperparathyroidism:  - calcium acetate when stable.    LOS: 2 Yisrael Obryan 12/5/20201:08 PM

## 2019-10-25 NOTE — Progress Notes (Signed)
Post HD Tx Note    10/25/19 2200  Hand-Off documentation  Report given to (Full Name) Cyril Mourning RN   Report received from (Full Name) Newt Minion   Vital Signs  Temp 98.6 F (37 C)  Temp Source Oral  Pulse Rate 81  Pulse Rate Source Monitor  Resp 17  BP 134/60  BP Location Right Arm  BP Method Automatic  Patient Position (if appropriate) Lying  Oxygen Therapy  SpO2 100 %  O2 Device Nasal Cannula  O2 Flow Rate (L/min) 4 L/min  Pain Assessment  Pain Scale 0-10  Pain Score 0  Post-Hemodialysis Assessment  Rinseback Volume (mL) 250 mL  KECN 61.9 V  Dialyzer Clearance Lightly streaked  Duration of HD Treatment -hour(s) 3 hour(s)  Hemodialysis Intake (mL) 500 mL  UF Total -Machine (mL) 3000 mL  Net UF (mL) 2500 mL  Tolerated HD Treatment Yes  AVG/AVF Arterial Site Held (minutes) 5 minutes  AVG/AVF Venous Site Held (minutes) 4 minutes  Fistula / Graft Left Forearm Arteriovenous fistula  No Placement Date or Time found.   Placed prior to admission: Yes  Orientation: Left  Access Location: Forearm  Access Type: Arteriovenous fistula  Site Condition No complications  Fistula / Graft Assessment Present;Thrill;Bruit  Status Deaccessed  Drainage Description None

## 2019-10-25 NOTE — Progress Notes (Signed)
Post HD Assessment   10/25/19 2100  Neurological  Level of Consciousness Alert  Orientation Level Oriented X4  Respiratory  Respiratory Pattern Regular;Unlabored  Chest Assessment Chest expansion symmetrical  Bilateral Breath Sounds Clear;Diminished  Cardiac  Pulse Irregular  Heart Sounds S1, S2;Murmur  Jugular Venous Distention (JVD) No  ECG Monitor Yes  Cardiac Rhythm NSR;Ventricular paced  Antiarrhythmic device No  Vascular  R Radial Pulse +2  L Radial Pulse +2  R Dorsalis Pedis Pulse +2  L Dorsalis Pedis Pulse +2  Edema Right lower extremity;Left lower extremity  RLE Edema +1  LLE Edema +1  Integumentary  Integumentary (WDL) X  Skin Color Appropriate for ethnicity  Skin Condition Dry  Skin Integrity Ecchymosis  Ecchymosis Location Groin  Ecchymosis Location Orientation Mid  Musculoskeletal  Musculoskeletal (WDL) X  Generalized Weakness Yes  Gastrointestinal  Bowel Sounds Assessment Active  Last BM Date 10/24/19  GU Assessment  Genitourinary (WDL) X  Genitourinary Symptoms External catheter  Urine Characteristics  Urine Color Amber  Urine Appearance Cloudy  Psychosocial  Psychosocial (WDL) WDL  Patient Behaviors Calm;Cooperative  Needs Expressed Emotional  Emotional support given Given to patient

## 2019-10-25 NOTE — Progress Notes (Signed)
Pre HD Tx Note    10/25/19 1850  Hand-Off documentation  Report given to (Full Name) Newt Minion RN   Report received from (Full Name) Arlis Porta  RN   Vital Signs  Temp 98.4 F (36.9 C)  Temp Source Oral  Pulse Rate 80  Pulse Rate Source Monitor  Resp 15  BP (!) 156/59  BP Location Right Arm  BP Method Automatic  Patient Position (if appropriate) Lying  Oxygen Therapy  SpO2 100 %  O2 Device Nasal Cannula  O2 Flow Rate (L/min) 4 L/min  Pain Assessment  Pain Scale 0-10  Pain Score 6  Pain Type Acute pain  Pain Location Arm  Pain Orientation Left  Pain Frequency Occasional  Pain Onset Gradual  Patients Stated Pain Goal 0  Pain Intervention(s) Medication (See eMAR)  Multiple Pain Sites No  Time-Out for Hemodialysis  What Procedure? HD  Pt Identifiers(min of two) MRN/Account#;First/Last Name  Correct Site? Yes  Correct Side? Yes  Correct Procedure? Yes  Consents Verified? Yes  Safety Precautions Reviewed? Yes  Engineer, civil (consulting) Number 3  Station Number 3  UF/Alarm Test Passed  Conductivity: Meter 13.6  Conductivity: Machine  13.6  pH 7.2  Reverse Osmosis Main  Normal Saline Lot Number [id number]  Dialyzer Lot Number [id number]  Disposable Set Lot Number [id number]  Machine Temperature 98.6 F (37 C)  Musician and Audible Yes  Blood Lines Intact and Secured Yes  Pre Treatment Patient Checks  Vascular access used during treatment Fistula  HD catheter dressing before treatment WDL  Patient is receiving dialysis in a chair  (in bed)  Hepatitis B Surface Antigen Results Negative  Date Hepatitis B Surface Antigen Drawn 10/16/19  Hepatitis B Surface Antibody  (>10)  Date Hepatitis B Surface Antibody Drawn 10/16/19  Hemodialysis Consent Verified Yes  Hemodialysis Standing Orders Initiated Yes  ECG (Telemetry) Monitor On Yes  Prime Ordered Normal Saline  Length of  DialysisTreatment -hour(s) 3 Hour(s)  Dialysis Treatment Comments  (Na140)  Dialyzer  Elisio 17H NR  Dialysate 2K;2.5 Ca  Dialysis Anticoagulant None  Dialysate Flow Ordered 600  Blood Flow Rate Ordered 400 mL/min  Ultrafiltration Goal 2 Liters  Dialysis Blood Pressure Support Ordered Albumin  Education / Care Plan  Dialysis Education Provided Yes  Documented Education in Care Plan Yes  Fistula / Graft Left Forearm Arteriovenous fistula  No Placement Date or Time found.   Placed prior to admission: Yes  Orientation: Left  Access Location: Forearm  Access Type: Arteriovenous fistula  Site Condition No complications  Fistula / Graft Assessment Present;Thrill;Bruit  Status Accessed  Needle Size 15  Drainage Description None

## 2019-10-25 NOTE — Discharge Summary (Signed)
Physician Discharge Summary  Phyllis [name] [id number] DOB: Nov 20, 1946 DOA: 10/23/2019  PCP: [name], MD  Admit date: 10/23/2019 Discharge date: 10/25/2019  Admitted From: SNF Disposition:  SNF  Recommendations for Outpatient Follow-up:  1. Follow up with PCP in 1-2 weeks 2. Please obtain BMP/CBC in one week 3. Please follow up on the following pending results:  Home Health:NO Equipment/Devices: Oxygen 2-3L  Discharge Condition:Stable CODE STATUS:Partial Diet recommendation: Heart Healthy  Brief/Interim Summary: Phyllis Stockslager Crutchfieldis a 73 y.o.femalewith medical history significant ofhypertension, hyperlipidemia, COPD, stroke, GERD, gout, anxiety, severe aortic valve stenosis, ESRD-HD (TTS), recent pulmonary edema and PEA arrest, anemia,dCHF, who presents withSOB.  Pt states that her SOB has been worsening thepast several days. She has dry cough but no wheezing. Oxygen desaturated to 83% on room air per report. Of note, patient was recently discharged on 3 L oxygen.She also reports intermittent mild chest discomfort. No nausea vomiting, diarrhea or abdominal pain. No symptoms of UTI or unilateral weakness.  Per EDP's note, "patient had a recent hospitalization requiring intubation, made comfort care but upon extubation patient awoke and wished to pursue aggressive care. She was subsequently transferred to [facility] for evaluation of possible TAVR. Heart catheterization 10/14/19 with severe left main and ostial LAD disease.Shewas not a candidate for sternotomy/CABG or high risk PCI.Palliative care team was consulted. She is DNR but no home hospice because she wished to continue hemodialysis". Today I discussed with pt about code status andexplained the meaning of CODE STATUS, patient wants to be partial code, OK for intubation, but no CPR).  12/4: Patient has been weaned from BiPAP.  Tolerating 2 to 2.5L nasal cannula.  2 L at home right.  Emergently  dialyzed 12/3 on presentation due to fluid overload with good result.  12/5: No acute status changes.  Patient tolerating 2 to 2-1/2 L nasal cannula with good saturation.  Will receive dialysis today.  Communicated with the nephrologist.  Patient stable for discharge back to skilled nurse facility postdialysis.   Discharge Diagnoses:  Principal Problem:   Acute on chronic respiratory failure with hypoxia (HCC) Active Problems:   ESRD (end stage renal disease) on dialysis (HCC)   Hyperlipidemia   CVA (cerebral vascular accident) ([place])   Chronic diastolic heart failure (HCC)   HTN (hypertension)   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-stage renal disease) ([place])   COPD with acute exacerbation (HCC)   Elevated troponin   CAD (coronary artery disease)   COPD exacerbation (HCC)   Leukocytosis   Anxiety    Discharge Instructions  Discharge Instructions    Diet - low sodium heart healthy   Complete by: As directed    Increase activity slowly   Complete by: As directed      Allergies as of 10/25/2019      Reactions   Enalapril Maleate Other (See Comments)   Other reaction(s): Headache   Nitrofurantoin Swelling, Rash   Other Reaction: swelling of body   Sulfamethoxazole-trimethoprim Swelling   2,4-d Dimethylamine (amisol) Rash, Other (See Comments)   Other Reaction: h/a   Baclofen Other (See Comments), Nausea Only   lightheadness ,drowsiness , muscle weakness , twitching in hands    Neosporin [neomycin-bacitracin Zn-polymyx] Other (See Comments), Rash   Other Reaction: irritation Skin irritation   Quinine Nausea And Vomiting, Rash, Other (See Comments)   Other Reaction: Vomiting, rash, h/a, vision   Ultram [tramadol] Palpitations   Zocor [simvastatin] Other (See Comments), Rash   Other Reaction: muscle spasms Muscle pain and spasms  Bactrim [sulfamethoxazole-trimethoprim] Swelling   Levodopa Other (See Comments)   Reaction: unknown   Macrodantin [nitrofurantoin Macrocrystal] Swelling    Quinine Derivatives Other (See Comments)   Vertigo,nausea vomiting blurred vision headache ears sensitivity      Medication List    TAKE these medications   acetaminophen 500 MG tablet Commonly known as: TYLENOL Take 1 tablet (500 mg total) by mouth every 6 (six) hours as needed for mild pain or fever.   albuterol 108 (90 Base) MCG/ACT inhaler Commonly known as: VENTOLIN HFA Inhale 2 puffs into the lungs every 6 (six) hours as needed for wheezing or shortness of breath.   ALPRAZolam 0.25 MG tablet Commonly known as: XANAX Take 0.25 mg by mouth every 6 (six) hours as needed for anxiety.   amLODipine 10 MG tablet Commonly known as: NORVASC Take 10 mg by mouth daily.   benzonatate 100 MG capsule Commonly known as: TESSALON Take 1 capsule (100 mg total) by mouth 2 (two) times daily as needed for cough.   calcium acetate 667 MG capsule Commonly known as: PHOSLO Take 1,334 mg by mouth 3 (three) times daily with meals.   carvedilol 12.5 MG tablet Commonly known as: COREG Take 12.5 mg by mouth 2 (two) times daily with a meal.   cetirizine 10 MG tablet Commonly known as: ZYRTEC Take 10 mg by mouth daily as needed for allergies.   clopidogrel 75 MG tablet Commonly known as: PLAVIX Take 1 tablet (75 mg total) by mouth daily.   Fluticasone-Salmeterol 250-50 MCG/DOSE Aepb Commonly known as: ADVAIR Inhale 1 puff into the lungs 2 (two) times daily as needed (for shortness of breath).   hydrALAZINE 50 MG tablet Commonly known as: APRESOLINE Take 1 tablet (50 mg total) by mouth every 8 (eight) hours.   HYDROcodone-acetaminophen 7.5-325 MG tablet Commonly known as: NORCO Take 1 tablet by mouth every 6 (six) hours as needed (pain).   ipratropium-albuterol 0.5-2.5 (3) MG/3ML Soln Commonly known as: DUONEB Take 3 mLs by nebulization every 6 (six) hours as needed.   lidocaine 5 % Commonly known as: LIDODERM Place 1 patch onto the skin daily. Remove & Discard patch within 12  hours or as directed by MD   lidocaine-prilocaine cream Commonly known as: EMLA Apply 1 application topically as needed (prior to treatment).   losartan 50 MG tablet Commonly known as: COZAAR Take 50 mg by mouth daily.   ondansetron 4 MG tablet Commonly known as: ZOFRAN Take 4 mg by mouth every 8 (eight) hours as needed.   oxybutynin 15 MG 24 hr tablet Commonly known as: DITROPAN XL Take 15 mg by mouth daily.   pantoprazole 40 MG tablet Commonly known as: Protonix Take 1 tablet (40 mg total) by mouth daily.   pravastatin 40 MG tablet Commonly known as: PRAVACHOL Take 40 mg by mouth at bedtime.   scopolamine 1 MG/3DAYS Commonly known as: TRANSDERM-SCOP Place 1 patch onto the skin every 3 (three) days.   topiramate 25 MG tablet Commonly known as: TOPAMAX Take 1 tablet (25 mg total) by mouth as needed (for headaches).   torsemide 20 MG tablet Commonly known as: DEMADEX Take 2 tablets (40 mg total) by mouth every Monday,Wednesday,Friday, and Sunday at 6 PM.       Allergies  Allergen Reactions  . Enalapril Maleate Other (See Comments)    Other reaction(s): Headache  . Nitrofurantoin Swelling and Rash    Other Reaction: swelling of body  . Sulfamethoxazole-Trimethoprim Swelling  . 2,4-D Dimethylamine (Amisol) Rash  and Other (See Comments)    Other Reaction: h/a  . Baclofen Other (See Comments) and Nausea Only    lightheadness ,drowsiness , muscle weakness , twitching in hands   . Neosporin [Neomycin-Bacitracin Zn-Polymyx] Other (See Comments) and Rash    Other Reaction: irritation Skin irritation   . Quinine Nausea And Vomiting, Rash and Other (See Comments)    Other Reaction: Vomiting, rash, h/a, vision  . Ultram [Tramadol] Palpitations  . Zocor [Simvastatin] Other (See Comments) and Rash    Other Reaction: muscle spasms Muscle pain and spasms  . Bactrim [Sulfamethoxazole-Trimethoprim] Swelling  . Levodopa Other (See Comments)    Reaction: unknown  .  Macrodantin [Nitrofurantoin Macrocrystal] Swelling  . Quinine Derivatives Other (See Comments)    Vertigo,nausea vomiting blurred vision headache ears sensitivity     Consultations:  Nephrology- Dr. Juleen China, [facility] [place] [facility]   Procedures/Studies: Dg Chest 1 View  Result Date: 10/21/2019 CLINICAL DATA:  Dyspnea EXAM: CHEST  1 VIEW COMPARISON:  10/12/2019 FINDINGS: Heart size is mildly enlarged, unchanged. Aorta is calcified and tortuous. Bilateral interstitial opacities, slightly progressed from prior. Streaky bibasilar airspace opacities are minimally progressed from prior. No large pleural fluid collection. No pneumothorax. IMPRESSION: Mildly increased perihilar and bibasilar interstitial opacities compared to prior. Findings may reflect interstitial edema with atelectasis and/or infection. Electronically Signed   By: Davina Poke M.D.   On: 10/21/2019 08:03   Dg Chest 1 View  Result Date: 10/01/2019 CLINICAL DATA:  Shortness of breath, respiratory distress EXAM: CHEST  1 VIEW COMPARISON:  09/24/2019 FINDINGS: Multifocal interstitial/airspace opacities, lower lobe predominant. This appearance is new/progressive from the prior. No pleural effusion or pneumothorax. The heart is normal in size.  Thoracic aortic atherosclerosis. Degenerative changes of the visualized thoracolumbar spine. IMPRESSION: Multifocal interstitial/airspace opacities, possibly reflecting interstitial edema given the clinical history. No associated pleural effusions. Technically speaking, in the appropriate clinical setting, atypical/viral pneumonia (including COVID) is within the differential. Electronically Signed   By: Julian Hy M.D.   On: 10/01/2019 17:30   Dg Chest Port 1 View  Result Date: 10/23/2019 CLINICAL DATA:  Shortness of breath and wheezing. EXAM: PORTABLE CHEST 1 VIEW COMPARISON:  Chest x-ray 10/21/2019 FINDINGS: The heart is mildly enlarged but stable. Stable tortuosity and extensive  calcification of the thoracic aorta. Mild peribronchial thickening and increased interstitial markings which may suggest bronchitis or reactive airways disease. No definite infiltrates or effusions. IMPRESSION: Stable cardiac enlargement and tortuosity and calcification of the thoracic aorta. Bronchitic changes could suggest bronchitis or interstitial pneumonitis. No focal infiltrates or effusions. Electronically Signed   By: Marijo Sanes M.D.   On: 10/23/2019 06:32   Dg Chest Port 1 View  Result Date: 10/12/2019 CLINICAL DATA:  Respiratory failure EXAM: PORTABLE CHEST 1 VIEW COMPARISON:  10/11/2019, 10/01/2019, 09/24/2019 FINDINGS: Removal of endotracheal tube. Low lung volumes. Patchy focus of atelectasis at the left base. Borderline to mild cardiomegaly. Aortic atherosclerosis. No pneumothorax. Removal of esophageal tube. IMPRESSION: 1. Removal of endotracheal and esophageal tubes. 2. Low lung volumes with atelectasis at the left base. Mild cardiomegaly. Electronically Signed   By: Donavan Foil M.D.   On: 10/12/2019 01:00   Dg Chest Portable 1 View  Result Date: 10/11/2019 CLINICAL DATA:  Status post CPR. EXAM: PORTABLE CHEST 1 VIEW COMPARISON:  10/01/2019 FINDINGS: 0647 hours. Endotracheal tube tip is 2.4 cm above the base of the carina. Cardiopericardial silhouette is at upper limits of normal for size. There is pulmonary vascular congestion with interstitial prominence suggesting  edema. NG tube tip is positioned in the mid stomach. The visualized bony structures of the thorax are intact. Insert wire. IMPRESSION: 1. Endotracheal tube tip is 2.4 cm above the base of the carina. 2. Pulmonary vascular congestion with interstitial edema. Electronically Signed   By: Misty Stanley M.D.   On: 10/11/2019 07:36     Subjective:   Discharge Exam: Vitals:   10/25/19 0612 10/25/19 1211  BP: (!) 132/47 (!) 150/51  Pulse: 74 82  Resp:  14  Temp: 98.4 F (36.9 C) 98.4 F (36.9 C)  SpO2: 100% 100%    Vitals:   10/24/19 2035 10/25/19 0606 10/25/19 0612 10/25/19 1211  BP: (!) 139/50 135/60 (!) 132/47 (!) 150/51  Pulse: 83  74 82  Resp: 20   14  Temp: 98.6 F (37 C)  98.4 F (36.9 C) 98.4 F (36.9 C)  TempSrc: Oral  Oral Oral  SpO2: 100%  100% 100%  Weight:      Height:        General:Not in acute distress HEENT: Eyes: PERRL, EOMI, no scleral icterus. ENT: No discharge from the ears and nose, no pharynx injection, no tonsillar enlargement.  Neck:No JVD, no bruit, no mass felt. Heme:No neck lymph node enlargement. Cardiac:S1/S2, RRR, No murmurs, No gallops or rubs. Respiratory:decreasedair movement bilaterally, has mild wheezing. NX:[id number], nondistended, nontender, no rebound pain, no organomegaly, BS present. GU: No hematuria Ext:has traceleg edema bilaterally. 2+DP/PT pulse bilaterally. Musculoskeletal:No joint deformities, No joint redness or warmth, no limitation of ROM in spin. Skin: No rashes.  Neuro: Alert, oriented X3, cranial nerves II-XII grossly intact, moves all extremities normally. Psych:Patient is not psychotic, no suicidal or hemocidal ideation.    The results of significant diagnostics from this hospitalization (including imaging, microbiology, ancillary and laboratory) are listed below for reference.     Microbiology: Recent Results (from the past 240 hour(s))  SARS Coronavirus 2 by RT PCR (hospital order, performed in [facility] hospital lab) Nasopharyngeal Nasopharyngeal Swab     Status: None   Collection Time: 10/23/19  5:57 AM   Specimen: Nasopharyngeal Swab  Result Value Ref Range Status   SARS Coronavirus 2 NEGATIVE NEGATIVE Final    Comment: (NOTE) SARS-CoV-2 target nucleic acids are NOT DETECTED. The SARS-CoV-2 RNA is generally detectable in upper and lower respiratory specimens during the acute phase of infection. The lowest concentration of SARS-CoV-2 viral copies this assay can detect is 250 copies / mL. A  negative result does not preclude SARS-CoV-2 infection and should not be used as the sole basis for treatment or other patient management decisions.  A negative result may occur with improper specimen collection / handling, submission of specimen other than nasopharyngeal swab, presence of viral mutation(s) within the areas targeted by this assay, and inadequate number of viral copies (<250 copies / mL). A negative result must be combined with clinical observations, patient history, and epidemiological information. Fact Sheet for Patients:   StrictlyIdeas.no Fact Sheet for Healthcare Providers: BankingDealers.co.za This test is not yet approved or cleared  by the [place] FDA and has been authorized for detection and/or diagnosis of SARS-CoV-2 by FDA under an Emergency Use Authorization (EUA).  This EUA will remain in effect (meaning this test can be used) for the duration of the COVID-19 declaration under Section 564(b)(1) of the Act, 21 U.S.C. section 360bbb-3(b)(1), unless the authorization is terminated or revoked sooner. Performed at [facility], [address] [place], [place] [address]   CULTURE, BLOOD (ROUTINE X  2) w Reflex to ID Panel     Status: None (Preliminary result)   Collection Time: 10/23/19 12:45 PM   Specimen: BLOOD  Result Value Ref Range Status   Specimen Description BLOOD RIGHT ANTECUBITAL  Final   Special Requests   Final    BOTTLES DRAWN AEROBIC AND ANAEROBIC Blood Culture adequate volume   Culture   Final    NO GROWTH 2 DAYS Performed at [facility], [address] [place], [place] [address]    Report Status PENDING  Incomplete  CULTURE, BLOOD (ROUTINE X 2) w Reflex to ID Panel     Status: None (Preliminary result)   Collection Time: 10/23/19 12:46 PM   Specimen: BLOOD  Result Value Ref Range Status   Specimen Description BLOOD WRIST  Final   Special Requests   Final    BOTTLES  DRAWN AEROBIC AND ANAEROBIC Blood Culture adequate volume   Culture   Final    NO GROWTH 2 DAYS Performed at [facility], [address] [place], [place] [address]    Report Status PENDING  Incomplete     Labs: BNP (last 3 results) Recent Labs    10/01/19 1650 10/11/19 0626 10/23/19 0544  BNP 1,854.0* 2,087.0* [id number]   Basic Metabolic Panel: Recent Labs  Lab 10/21/19 0650 10/23/19 0544 10/25/19 0859  NA 135 134* 136  K 4.5 4.1 4.0  CL 95* 94* 95*  CO2 26 26 29   GLUCOSE 107* 148* 176*  BUN 35* 34* 50*  CREATININE 6.48* 6.00* 5.57*  CALCIUM 9.1 8.8* 8.8*  MG 2.2  --   --   PHOS  --   --  3.1   Liver Function Tests: Recent Labs  Lab 10/21/19 0650 10/23/19 0544 10/25/19 0859  AST 15 20  --   ALT 8 8  --   ALKPHOS 74 79  --   BILITOT 0.9 0.8  --   PROT 6.2* 6.0*  --   ALBUMIN 3.7 3.4* 3.6   Recent Labs  Lab 10/21/19 0650  LIPASE 20   No results for input(s): AMMONIA in the last 168 hours. CBC: Recent Labs  Lab 10/21/19 0650 10/23/19 0544 10/23/19 1052 10/25/19 0859  WBC 7.9 14.0* 12.1* 9.5  NEUTROABS 6.0 11.8*  --   --   HGB 8.2* 8.0* 7.5* 7.6*  HCT 25.2* 25.5* 23.8* 23.8*  MCV 91.0 96.6 95.2 92.2  PLT 227 265 189 191   Cardiac Enzymes: No results for input(s): CKTOTAL, CKMB, CKMBINDEX, TROPONINI in the last 168 hours. BNP: Invalid input(s): POCBNP CBG: No results for input(s): GLUCAP in the last 168 hours. D-Dimer No results for input(s): DDIMER in the last 72 hours. Hgb A1c No results for input(s): HGBA1C in the last 72 hours. Lipid Profile No results for input(s): CHOL, HDL, LDLCALC, TRIG, CHOLHDL, LDLDIRECT in the last 72 hours. Thyroid function studies No results for input(s): TSH, T4TOTAL, T3FREE, THYROIDAB in the last 72 hours.  Invalid input(s): FREET3 Anemia work up No results for input(s): VITAMINB12, FOLATE, FERRITIN, TIBC, IRON, RETICCTPCT in the last 72 hours. Urinalysis    Component Value Date/Time    COLORURINE YELLOW (A) 11/28/2018 2146   APPEARANCEUR CLEAR (A) 11/28/2018 2146   LABSPEC 1.012 11/28/2018 2146   PHURINE 8.0 11/28/2018 2146   GLUCOSEU 50 (A) 11/28/2018 2146   HGBUR NEGATIVE 11/28/2018 2146   BILIRUBINUR NEGATIVE 11/28/2018 2146   KETONESUR 5 (A) 11/28/2018 2146   PROTEINUR >=300 (A) 11/28/2018 2146   NITRITE NEGATIVE 11/28/2018  2146   LEUKOCYTESUR NEGATIVE 11/28/2018 2146   Sepsis Labs Invalid input(s): PROCALCITONIN,  WBC,  LACTICIDVEN Microbiology Recent Results (from the past 240 hour(s))  SARS Coronavirus 2 by RT PCR (hospital order, performed in [facility] hospital lab) Nasopharyngeal Nasopharyngeal Swab     Status: None   Collection Time: 10/23/19  5:57 AM   Specimen: Nasopharyngeal Swab  Result Value Ref Range Status   SARS Coronavirus 2 NEGATIVE NEGATIVE Final    Comment: (NOTE) SARS-CoV-2 target nucleic acids are NOT DETECTED. The SARS-CoV-2 RNA is generally detectable in upper and lower respiratory specimens during the acute phase of infection. The lowest concentration of SARS-CoV-2 viral copies this assay can detect is 250 copies / mL. A negative result does not preclude SARS-CoV-2 infection and should not be used as the sole basis for treatment or other patient management decisions.  A negative result may occur with improper specimen collection / handling, submission of specimen other than nasopharyngeal swab, presence of viral mutation(s) within the areas targeted by this assay, and inadequate number of viral copies (<250 copies / mL). A negative result must be combined with clinical observations, patient history, and epidemiological information. Fact Sheet for Patients:   StrictlyIdeas.no Fact Sheet for Healthcare Providers: BankingDealers.co.za This test is not yet approved or cleared  by the [place] FDA and has been authorized for detection and/or diagnosis of SARS-CoV-2 by FDA under an  Emergency Use Authorization (EUA).  This EUA will remain in effect (meaning this test can be used) for the duration of the COVID-19 declaration under Section 564(b)(1) of the Act, 21 U.S.C. section 360bbb-3(b)(1), unless the authorization is terminated or revoked sooner. Performed at [facility], [address] [place], [place] [address]   CULTURE, BLOOD (ROUTINE X 2) w Reflex to ID Panel     Status: None (Preliminary result)   Collection Time: 10/23/19 12:45 PM   Specimen: BLOOD  Result Value Ref Range Status   Specimen Description BLOOD RIGHT ANTECUBITAL  Final   Special Requests   Final    BOTTLES DRAWN AEROBIC AND ANAEROBIC Blood Culture adequate volume   Culture   Final    NO GROWTH 2 DAYS Performed at [facility], [address] [place], [place] [address]    Report Status PENDING  Incomplete  CULTURE, BLOOD (ROUTINE X 2) w Reflex to ID Panel     Status: None (Preliminary result)   Collection Time: 10/23/19 12:46 PM   Specimen: BLOOD  Result Value Ref Range Status   Specimen Description BLOOD WRIST  Final   Special Requests   Final    BOTTLES DRAWN AEROBIC AND ANAEROBIC Blood Culture adequate volume   Culture   Final    NO GROWTH 2 DAYS Performed at [facility], [address] [place],  [address]    Report Status PENDING  Incomplete     Time coordinating discharge: Over 30 minutes  SIGNED:   Sidney Ace, MD  [facility] 10/25/2019, 12:47 PM Pager [phone]  If 7PM-7AM, please contact night-coverage [email or web address] Password [email or web address]

## 2019-10-25 NOTE — Progress Notes (Signed)
Patient tested positive for COVID on 11/21. When she was readmitted here on 12/3 she tested negative. It has been 14 days since original positive result. This RN contacted Dr. Delaine Lame to see about discontinuing Airborne and Contact precautions. Per Dr. Ilene Qua orders isolation was discontinued due to her feeling like the first test was a false positive. Infection Prevention was called,  they agreed that patient could be discontinued before the 21 days with Dr. Gwenevere Ghazi approval. Patient is now on standard precautions.

## 2019-10-25 NOTE — Plan of Care (Signed)
  Problem: Clinical Measurements: Goal: Ability to maintain clinical measurements within normal limits will improve Outcome: Progressing Goal: Will remain free from infection Outcome: Progressing Goal: Diagnostic test results will improve Outcome: Progressing Goal: Respiratory complications will improve Outcome: Progressing Goal: Cardiovascular complication will be avoided Outcome: Progressing  Pt tolerated well the Tx. AVF access got infiltrated at 1630 and Tx was canceled at 1645. Cold Pack was applied Extremity elevated. Infiltration resolved and AVF was re-accessed again at [place]. Tx start at slow FR 250and gradually increased to 400. Pt is tolerating it well. AVF looks good no further complications, no pain or swelling. Will keep monitoring

## 2019-10-25 NOTE — TOC Transition Note (Addendum)
Transition of Care [facility]) - CM/SW Discharge Note   Patient Details  Name: Phyllis [name] MRN: [id number] Date of Birth: 03/29/1946  Transition of Care [facility]) CM/SW Contact:  Marshell Garfinkel, RN Phone Number: 10/25/2019, 2:36 PM   Clinical Narrative:     Nurse to call report to [facility] [phone]; Room 203. I spoke with Neoma Laming with [facility] and she is out of town. She said they can take her back today. Facility has been notified. RN updated. Notified by RN that patient will not be dialyze until 1900. Per Amy with [facility] they can still take late discharge.   Final next level of care: [place] Barriers to Discharge: No Barriers Identified   Patient Goals and CMS Choice Patient states their goals for this hospitalization and ongoing recovery are:: "return to [facility]" CMS Medicare.gov Compare Post Acute Care list provided to:: Patient    Discharge Placement                Patient to be transferred to facility by: [facility]      Discharge Plan and Services                                     Social Determinants of Health (SDOH) Interventions     Readmission Risk Interventions Readmission Risk Prevention Plan 10/13/2019 09/26/2019 05/07/2019  Transportation Screening - Complete Complete  Medication Review [employer]) - Complete Complete  PCP or Specialist appointment within 3-5 days of discharge Complete Complete Complete  HRI or Home Care Consult (No Data) Complete Complete  SW Recovery Care/Counseling Consult Complete Complete -  Palliative Care Screening Not Applicable Not Applicable Not Applicable  Skilled Nursing Facility Complete Not Applicable Not Applicable  Some recent data might be hidden

## 2019-10-25 NOTE — Progress Notes (Signed)
Pre HD Tx Assessment   10/25/19 1750  Neurological  Level of Consciousness Alert  Orientation Level Oriented X4  Respiratory  Respiratory Pattern Regular;Unlabored  Chest Assessment Chest expansion symmetrical  Bilateral Breath Sounds Clear;Diminished  Cough Non-productive  Sputum Amount None  Cardiac  Pulse Regular  Heart Sounds S1, S2  Jugular Venous Distention (JVD) No  ECG Monitor Yes  Cardiac Rhythm NSR;Ventricular paced  Antiarrhythmic device No  Vascular  R Radial Pulse +2  L Radial Pulse +2  R Dorsalis Pedis Pulse +2  L Dorsalis Pedis Pulse +2  Edema Right lower extremity;Left lower extremity  RLE Edema +1  LLE Edema +1  Integumentary  Integumentary (WDL) X  Skin Color Appropriate for ethnicity  Skin Condition Dry  Skin Integrity Ecchymosis  Ecchymosis Location Groin  Ecchymosis Location Orientation Mid  Musculoskeletal  Musculoskeletal (WDL) X  Generalized Weakness Yes  Gastrointestinal  Bowel Sounds Assessment Active  GU Assessment  Genitourinary (WDL) X  Genitourinary Symptoms External catheter  Urine Characteristics  Urine Color Amber  Urine Appearance Cloudy  Psychosocial  Psychosocial (WDL) WDL  Patient Behaviors Calm;Cooperative  Needs Expressed Emotional  Emotional support given Given to patient

## 2019-10-25 NOTE — Care Management (Signed)
RNCM spoke with patient who agrees with discharge back to [facility]. I have left message for Neoma Laming with [facility]; I attempted to call [facility] but no answer. Discharge summary needed. RN updated. COV2 test negative from 10/23/19.

## 2019-10-25 NOTE — Progress Notes (Signed)
HD Tx Completed   10/25/19 2155  Vital Signs  Pulse Rate 77  Resp 17  BP (!) 112/52  Oxygen Therapy  SpO2 100 %  O2 Device Nasal Cannula  O2 Flow Rate (L/min) 4 L/min  Pain Assessment  Pain Scale 0-10  Pain Score 0  During Hemodialysis Assessment  Blood Flow Rate (mL/min) 400 mL/min  Arterial Pressure (mmHg) -180 mmHg  Venous Pressure (mmHg) 130 mmHg  Transmembrane Pressure (mmHg) 80 mmHg  Ultrafiltration Rate (mL/min) 1000 mL/min  Dialysate Flow Rate (mL/min) 600 ml/min  Conductivity: Machine  14  HD Safety Checks Performed Yes  Intra-Hemodialysis Comments Tx completed

## 2019-10-26 NOTE — Progress Notes (Signed)
Patient escorted off unit via EMS on stretcher. Discharge packet given to EMS transport. Patient denies any complaints while leaving floor.

## 2019-10-28 LAB — CULTURE, BLOOD (ROUTINE X 2)
Culture: NO GROWTH
Culture: NO GROWTH
Special Requests: ADEQUATE
Special Requests: ADEQUATE

## 2019-10-31 ENCOUNTER — Emergency Department
Admission: EM | Admit: 2019-10-31 | Discharge: 2019-10-31 | Disposition: A | Discharge status: Other Acute Inpatient Hospital | Payer: Medicare Other | Attending: Emergency Medicine | Admitting: Emergency Medicine

## 2019-10-31 ENCOUNTER — Other Ambulatory Visit: Payer: Self-pay

## 2019-10-31 DIAGNOSIS — Z7902 Long term (current) use of antithrombotics/antiplatelets: Secondary | ICD-10-CM | POA: Insufficient documentation

## 2019-10-31 DIAGNOSIS — N186 End stage renal disease: Secondary | ICD-10-CM | POA: Diagnosis not present

## 2019-10-31 DIAGNOSIS — I2510 Atherosclerotic heart disease of native coronary artery without angina pectoris: Secondary | ICD-10-CM | POA: Diagnosis not present

## 2019-10-31 DIAGNOSIS — D638 Anemia in other chronic diseases classified elsewhere: Secondary | ICD-10-CM | POA: Diagnosis not present

## 2019-10-31 DIAGNOSIS — Z79899 Other long term (current) drug therapy: Secondary | ICD-10-CM | POA: Insufficient documentation

## 2019-10-31 DIAGNOSIS — I132 Hypertensive heart and chronic kidney disease with heart failure and with stage 5 chronic kidney disease, or end stage renal disease: Secondary | ICD-10-CM | POA: Insufficient documentation

## 2019-10-31 DIAGNOSIS — J45909 Unspecified asthma, uncomplicated: Secondary | ICD-10-CM | POA: Insufficient documentation

## 2019-10-31 DIAGNOSIS — Z992 Dependence on renal dialysis: Secondary | ICD-10-CM | POA: Diagnosis not present

## 2019-10-31 DIAGNOSIS — I5032 Chronic diastolic (congestive) heart failure: Secondary | ICD-10-CM | POA: Insufficient documentation

## 2019-10-31 DIAGNOSIS — E1122 Type 2 diabetes mellitus with diabetic chronic kidney disease: Secondary | ICD-10-CM | POA: Insufficient documentation

## 2019-10-31 DIAGNOSIS — J449 Chronic obstructive pulmonary disease, unspecified: Secondary | ICD-10-CM | POA: Diagnosis not present

## 2019-10-31 DIAGNOSIS — R799 Abnormal finding of blood chemistry, unspecified: Secondary | ICD-10-CM | POA: Diagnosis present

## 2019-10-31 LAB — COMPREHENSIVE METABOLIC PANEL
ALT: 10 U/L (ref 0–44)
AST: 15 U/L (ref 15–41)
Albumin: 3.5 g/dL (ref 3.5–5.0)
Alkaline Phosphatase: 84 U/L (ref 38–126)
Anion gap: 7 (ref 5–15)
BUN: 28 mg/dL — ABNORMAL HIGH (ref 8–23)
CO2: 32 mmol/L (ref 22–32)
Calcium: 8.9 mg/dL (ref 8.9–10.3)
Chloride: 101 mmol/L (ref 98–111)
Creatinine, Ser: 5.11 mg/dL — ABNORMAL HIGH (ref 0.44–1.00)
GFR calc Af Amer: 9 mL/min — ABNORMAL LOW (ref >60)
GFR calc non Af Amer: 8 mL/min — ABNORMAL LOW (ref >60)
Glucose, Bld: 143 mg/dL — ABNORMAL HIGH (ref 70–99)
Potassium: 3.8 mmol/L (ref 3.5–5.1)
Sodium: 140 mmol/L (ref 135–145)
Total Bilirubin: 0.5 mg/dL (ref 0.3–1.2)
Total Protein: 6 g/dL — ABNORMAL LOW (ref 6.5–8.1)

## 2019-10-31 LAB — CBC
HCT: 25.4 % — ABNORMAL LOW (ref 36.0–46.0)
Hemoglobin: 8 g/dL — ABNORMAL LOW (ref 12.0–15.0)
MCH: 30.3 pg (ref 26.0–34.0)
MCHC: 31.5 g/dL (ref 30.0–36.0)
MCV: 96.2 fL (ref 80.0–100.0)
Platelets: 203 10*3/uL (ref 150–400)
RBC: 2.64 MIL/uL — ABNORMAL LOW (ref 3.87–5.11)
RDW: 14.9 % (ref 11.5–15.5)
WBC: 7.8 10*3/uL (ref 4.0–10.5)
nRBC: 0 % (ref 0.0–0.2)

## 2019-10-31 NOTE — ED Triage Notes (Signed)
Pt arrives via ems from [facility], pt states that she had repeat blood work performed and was told her levels were low, states that she had a recent blood transfusion but denies feeling any different and denies knowingly losing blood. Pt is regularly on 5L of O2 [place], no distress noted

## 2019-10-31 NOTE — ED Provider Notes (Signed)
[facility] Emergency Department Provider Note  ____________________________________________   First MD Initiated Contact with Patient 10/31/19 2114     (approximate)  I have reviewed the triage vital signs and the nursing notes.   HISTORY  Chief Complaint Abnormal Lab and Anemia   HPI Phyllis [name] is a 73 y.o. female below list of chronic medical conditions including anemia of chronic disease presents to the emergency department secondary to abnormal laboratory data.  Patient stated she had blood work performed yesterday and hemoglobin was noted to be low.  Documentation presented to me on arrival to the emergency department revealed a hemoglobin of 6.8.  Patient denies any symptoms.  No chest pain shortness of breath abdominal pain.  Patient denies any blood in her stool or any dark stools.  Patient denies any vomiting.        Past Medical History:  Diagnosis Date  . (HFpEF) heart failure with preserved ejection fraction ([place])    a. TTE 01/2014: nl LV sys fxn, no valvular abnormalities; b. TTE 11/16: nl EF, mild LVH;  c. 04/2019 Echo: EF 60-65%. DD. Nl RV fxn. Mod AS. 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  . Allergy   . Anemia of chronic disease   . Anxiety   . Aortic atherosclerosis ([place])   . Asthma   . Chronic back pain   . COPD (chronic obstructive pulmonary disease) ([place])   . Diabetes mellitus with complication ([place])   . ESRD on hemodialysis ([place])    a. Tues/Sat; b. 2/2 small kidneys  . Essential hypertension   . Fistula    lower left arm  . GERD (gastroesophageal reflux disease)   . Gout   . History of exercise stress test    a. 01/2014: no evidence of ischemia; b. Lexiscan 08/2015: no sig ischemia, severe GI uptake artifact, low risk; c. CPET @ [facility] 09/2016: exercised 3 min 12 sec on bike without incline, 2.28 METs, VO2 of 8.1, 48% of predicted, indicating mod to sev functional impairment, evidence of blunted HR, stroke  volume, and BP augmentation as well as ventilation-perfusion mismatch with exercise  . HLD (hyperlipidemia)   . Non-obstructive Carotid arterial disease ([place])    a. 12/2017: <50% bilat ICA dzs.  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 Severe aortic stenosis    a. by 09/2019 echo b. 04/2019 Echo: Mod AS.  Marland Kitchen Sleep apnea     Patient Active Problem List   Diagnosis Date Noted  . Acute respiratory failure ([place]) 10/23/2019  . COPD with acute exacerbation ([place]) 10/23/2019  . Elevated troponin 10/23/2019  . CAD (coronary artery disease) 10/23/2019  . COPD exacerbation ([place]) 10/23/2019  . Leukocytosis 10/23/2019  . Anxiety 10/23/2019  . Palliative care by specialist   . Goals of care, counseling/discussion   . Flash pulmonary edema ([place]) 10/14/2019  . Respiratory failure with hypoxia ([place]) 10/11/2019  . 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-stage renal disease) ([place])   . Acute on chronic respiratory failure with hypoxia ([place]) 10/01/2019  . Severe aortic valve stenosis   . Acute respiratory failure with hypoxemia ([place]) 09/20/2019  . COPD with acute bronchitis ([place]) 09/05/2019  . Congestive heart failure ([place]) 05/24/2019  . Hyperkalemia 11/29/2018  . Chronic diastolic heart failure ([place]) 11/02/2018  . HTN (hypertension) 11/02/2018  . Uncontrolled hypertension 10/21/2018  . History of acute pulmonary edema 10/21/2018  . Pressure injury of skin 03/16/2018  . CVA (cerebral vascular accident) (  [place]) 01/16/2018  . Aortic atherosclerosis ([place]) 12/11/2017  . Chest pain 09/18/2017  . Hyperlipidemia 08/06/2015  . Complication from renal dialysis device 04/12/2015  . SOB (shortness of breath) 02/01/2015  . ESRD (end stage renal disease) on dialysis ([place]) 02/01/2015  . Type 2 diabetes mellitus with other specified complication ([place]) [id number]  . Asthma 02/01/2015  . Acute on chronic diastolic CHF (congestive heart failure) ([place]) 02/01/2015    Past Surgical History:  Procedure Laterality  Date  . carpel tunnel    . GALLBLADDER SURGERY    . PERIPHERAL VASCULAR CATHETERIZATION N/A 04/12/2015   Procedure: A/V Shuntogram/Fistulagram;  Surgeon: Algernon Huxley, MD;  Location: [place] [facility];  Service: Cardiovascular;  Laterality: N/A;  . PERIPHERAL VASCULAR CATHETERIZATION N/A 04/12/2015   Procedure: A/V Shunt Intervention;  Surgeon: Algernon Huxley, MD;  Location: [place] [facility];  Service: Cardiovascular;  Laterality: N/A;  . PERIPHERAL VASCULAR CATHETERIZATION N/A 06/09/2015   Procedure: Dialysis/Perma Catheter Removal;  Surgeon: Katha Cabal, MD;  Location: [place] [facility];  Service: Cardiovascular;  Laterality: N/A;  . RIGHT/LEFT HEART CATH AND CORONARY ANGIOGRAPHY N/A 10/14/2019   Procedure: RIGHT/LEFT HEART CATH AND CORONARY ANGIOGRAPHY;  Surgeon: Belva Crome, MD;  Location: [place] [facility];  Service: Cardiovascular;  Laterality: N/A;    Prior to Admission medications   Medication Sig Start Date End Date Taking? Authorizing Provider  acetaminophen (TYLENOL) 500 MG tablet Take 1 tablet (500 mg total) by mouth every 6 (six) hours as needed for mild pain or fever. 09/29/19   Danford, Suann Larry, MD  albuterol (PROVENTIL HFA;VENTOLIN HFA) 108 (90 Base) MCG/ACT inhaler Inhale 2 puffs into the lungs every 6 (six) hours as needed for wheezing or shortness of breath.    [provider]  ALPRAZolam Duanne Moron) 0.25 MG tablet Take 0.25 mg by mouth every 6 (six) hours as needed for anxiety.    [provider]  amLODipine (NORVASC) 10 MG tablet Take 10 mg by mouth daily.  12/29/13   [provider]  benzonatate (TESSALON) 100 MG capsule Take 1 capsule (100 mg total) by mouth 2 (two) times daily as needed for cough. 10/17/19   [facility], Tami Lin, PA  calcium acetate (PHOSLO) 667 MG capsule Take 1,334 mg by mouth 3 (three) times daily with meals.     [provider]  carvedilol (COREG) 12.5 MG tablet Take 12.5 mg by mouth 2 (two) times daily  with a meal.  01/09/14   [provider]  cetirizine (ZYRTEC) 10 MG tablet Take 10 mg by mouth daily as needed for allergies.     [provider]  clopidogrel (PLAVIX) 75 MG tablet Take 1 tablet (75 mg total) by mouth daily. 01/18/18   Epifanio Lesches, MD  Fluticasone-Salmeterol (ADVAIR) 250-50 MCG/DOSE AEPB Inhale 1 puff into the lungs 2 (two) times daily as needed (for shortness of breath). 09/07/19   Henreitta Leber, MD  hydrALAZINE (APRESOLINE) 50 MG tablet Take 1 tablet (50 mg total) by mouth every 8 (eight) hours. 09/29/19   Danford, Suann Larry, MD  HYDROcodone-acetaminophen (NORCO) 7.5-325 MG tablet Take 1 tablet by mouth every 6 (six) hours as needed (pain). 10/08/19   Danford, Suann Larry, MD  ipratropium-albuterol (DUONEB) 0.5-2.5 (3) MG/3ML SOLN Take 3 mLs by nebulization every 6 (six) hours as needed. 10/17/19   [facility], Tami Lin, PA  lidocaine (LIDODERM) 5 % Place 1 patch onto the skin daily. Remove & Discard patch within 12 hours or as  directed by MD 10/14/19   Flora Lipps, MD  lidocaine-prilocaine (EMLA) cream Apply 1 application topically as needed (prior to treatment).     [provider]  losartan (COZAAR) 50 MG tablet Take 50 mg by mouth daily. 03/18/19   [provider]  ondansetron (ZOFRAN) 4 MG tablet Take 4 mg by mouth every 8 (eight) hours as needed. 05/20/19   [provider]  oxybutynin (DITROPAN XL) 15 MG 24 hr tablet Take 15 mg by mouth daily. 12/24/17   [provider]  pantoprazole (PROTONIX) 40 MG tablet Take 1 tablet (40 mg total) by mouth daily. 10/17/19 10/16/20  Ledora Bottcher, PA  pravastatin (PRAVACHOL) 40 MG tablet Take 40 mg by mouth at bedtime.     [provider]  scopolamine (TRANSDERM-SCOP) 1 MG/3DAYS Place 1 patch onto the skin every 3 (three) days.    [provider]  topiramate (TOPAMAX) 25 MG tablet Take 1 tablet (25 mg total) by mouth as needed (for headaches). 09/07/19    Henreitta Leber, MD  torsemide (DEMADEX) 20 MG tablet Take 2 tablets (40 mg total) by mouth every Monday,Wednesday,Friday, and Sunday at 6 PM. 10/01/19   Danford, Suann Larry, MD    Allergies Enalapril maleate; Nitrofurantoin; Sulfamethoxazole-trimethoprim; 2,4-d dimethylamine (amisol); Baclofen; Neosporin [neomycin-bacitracin zn-polymyx]; Quinine; Ultram [tramadol]; Zocor [simvastatin]; Bactrim [sulfamethoxazole-trimethoprim]; Levodopa; Macrodantin [nitrofurantoin macrocrystal]; and Quinine derivatives  Family History  Problem Relation Age of Onset  . Hypertension Mother   . Hyperlipidemia Mother   . Heart disease Father   . Heart attack Father 62  . Hypertension Father   . Hyperlipidemia Father   . Heart disease Brother        CABG   . Heart attack Brother   . Breast cancer Neg Hx     Social History Social History   Tobacco Use  . Smoking status: Never Smoker  . Smokeless tobacco: Never Used  Substance Use Topics  . Alcohol use: No  . Drug use: No    Review of Systems Constitutional: No fever/chills Eyes: No visual changes. ENT: No sore throat. Cardiovascular: Denies chest pain. Respiratory: Denies shortness of breath. Gastrointestinal: No abdominal pain.  No nausea, no vomiting.  No diarrhea.  No constipation. Genitourinary: Negative for dysuria. Musculoskeletal: Negative for neck pain.  Negative for back pain. Integumentary: Negative for rash. Neurological: Negative for headaches, focal weakness or numbness.  ____________________________________________   PHYSICAL EXAM:  VITAL SIGNS: ED Triage Vitals  Enc Vitals Group     BP 10/31/19 1703 (!) 147/48     Pulse Rate 10/31/19 1703 74     Resp 10/31/19 1703 18     Temp 10/31/19 1703 98.5 F (36.9 C)     Temp Source 10/31/19 1703 Oral     SpO2 10/31/19 1703 100 %     Weight 10/31/19 1704 56.2 kg (124 lb)     Height 10/31/19 1704 1.473 m (4\' 10" )     Head Circumference --      Peak Flow --      Pain  Score 10/31/19 1704 0     Pain Loc --      Pain Edu? --      Excl. in [place]? --     Constitutional: Alert and oriented.  Eyes: Conjunctivae are normal.  Mouth/Throat: Patient is wearing a mask. Neck: No stridor.  No meningeal signs.   Cardiovascular: Normal rate, regular rhythm. Good peripheral circulation. Grossly normal heart sounds. Respiratory: Normal respiratory effort.  No retractions.  Gastrointestinal: Soft and nontender. No distention.  Musculoskeletal: No lower extremity tenderness nor edema. No gross deformities of extremities. Neurologic:  Normal speech and language. No gross focal neurologic deficits are appreciated.  Skin:  Skin is warm, dry and intact. Psychiatric: Mood and affect are normal. Speech and behavior are normal.  ____________________________________________   LABS (all labs ordered are listed, but only abnormal results are displayed)  Labs Reviewed  CBC - Abnormal; Notable for the following components:      Result Value   RBC 2.64 (*)    Hemoglobin 8.0 (*)    HCT 25.4 (*)    All other components within normal limits  COMPREHENSIVE METABOLIC PANEL - Abnormal; Notable for the following components:   Glucose, Bld 143 (*)    BUN 28 (*)    Creatinine, Ser 5.11 (*)    Total Protein 6.0 (*)    GFR calc non Af Amer 8 (*)    GFR calc Af Amer 9 (*)    All other components within normal limits      Procedures   ____________________________________________   INITIAL IMPRESSION / MDM / ASSESSMENT AND PLAN / ED COURSE  As part of my medical decision making, I reviewed the following data within the electronic MEDICAL RECORD NUMBER  73 year old female presenting to the emergency department secondary to an abnormal laboratory data (hemoglobin of 6.8).  However patient's laboratory data here revealed a hemoglobin of 8.0 which is actually improved from the patient's hemoglobin on 10/25/2019 at which point her hemoglobin was 7.6.  Patient's hemoglobin is consistent  with recent hemoglobin.    ____________________________________________  FINAL CLINICAL IMPRESSION(S) / ED DIAGNOSES  Final diagnoses:  Anemia of chronic disease     MEDICATIONS GIVEN DURING THIS VISIT:  Medications - No data to display   ED Discharge Orders    None      *Please note:  Phyllis [name] was evaluated in Emergency Department on 10/31/2019 for the symptoms described in the history of present illness. She was evaluated in the context of the global COVID-19 pandemic, which necessitated consideration that the patient might be at risk for infection with the SARS-CoV-2 virus that causes COVID-19. Institutional protocols and algorithms that pertain to the evaluation of patients at risk for COVID-19 are in a state of rapid change based on information released by regulatory bodies including the CDC and federal and state organizations. These policies and algorithms were followed during the patient's care in the ED.  Some ED evaluations and interventions may be delayed as a result of limited staffing during the pandemic.*  Note:  This document was prepared using Dragon voice recognition software and may include unintentional dictation errors.   Gregor Hams, MD 10/31/19 2138

## 2019-10-31 NOTE — ED Notes (Signed)
T.J from [facility] facility called to ck on pt informed pt just got back to exam room will update as appropriate  [phone]

## 2019-11-12 ENCOUNTER — Non-Acute Institutional Stay: Payer: PRIVATE HEALTH INSURANCE | Admitting: Primary Care

## 2019-11-12 ENCOUNTER — Other Ambulatory Visit: Payer: Self-pay

## 2019-11-13 ENCOUNTER — Non-Acute Institutional Stay: Payer: Medicare Other | Admitting: Primary Care

## 2019-11-20 ENCOUNTER — Non-Acute Institutional Stay: Payer: Medicare Other | Admitting: Primary Care

## 2019-11-20 ENCOUNTER — Other Ambulatory Visit: Payer: Self-pay

## 2019-11-26 ENCOUNTER — Non-Acute Institutional Stay: Payer: Medicare Other | Admitting: Primary Care

## 2019-11-26 ENCOUNTER — Other Ambulatory Visit: Payer: Self-pay

## 2019-11-27 ENCOUNTER — Non-Acute Institutional Stay: Payer: Medicare Other | Admitting: Primary Care

## 2019-11-27 ENCOUNTER — Other Ambulatory Visit: Payer: Self-pay

## 2019-11-27 DIAGNOSIS — Z515 Encounter for palliative care: Secondary | ICD-10-CM

## 2019-11-27 NOTE — Progress Notes (Signed)
[facility] Palliative Care Consult Note Telephone: [phone]  Fax: [phone]  TELEHEALTH VISIT STATEMENT Due to the COVID-19 crisis, this visit was done via telemedicine from my office. It was initiated and consented to by this patient and/or family.  PATIENT NAME: JANAEYA LYSON [facility] [address] [id number] (home)  DOB: [birth year] MRN: [id number]  PRIMARY CARE PROVIDER:   Alvester Morin, MD [phone] [address]. [place] [address] [phone]  REFERRING PROVIDER: Alvester Morin, MD [address]. [place],  [place] [address] [phone]  RESPONSIBLE PARTY:   Extended Emergency Contact Information Primary Emergency Contact: Marlene Lard Mobile Phone: [phone] Relation: Friend Secondary Emergency Contact: Orbie Pyo Mobile Phone: [phone] Relation: Friend   ASSESSMENT AND RECOMMENDATIONS:   1. Advance Care Planning/Goals of Care: Goals include to maximize quality of life and symptom management. She states she had CPR several months ago. She states her chest is still painful from the CPR. Has Advance directives on file with POA named as Marlene Lard. This POA was done recently. There are written in directions to not prolong life. Currently she is making her own health care decisions.  We will continue to discuss MOST when able to meet in person.  2. Symptom Management:   Constipation: Recommend senna scheduled due to limited fluids, immobility and narcotic use. She states she prefers a pill to miralax.  3. Family /Caregiver/Community Supports: States she lives alone but will be getting live -in caregiver. At [facility] SN[facility] currently for rehab and anticipates d/c next week. Goes for hemodialysis TThS.  4. Cognitive / Functional decline: Alert, oriented x 3. Can ambulate with help and walker, Needs help with some adls and iadls. 8 trips to ED in 2 months, with respiratory failure, dyspnea  due to CHF. She is currently also going to hemodialysis. Will need following at home with home health, palliative care.   5. Follow up Palliative Care Visit: Palliative care will continue to follow for goals of care clarification and symptom management. Return 2-4 weeks or prn.  I spent 35 minutes providing this consultation,  from 1700 to 1735. More than 50% of the time in this consultation was spent coordinating communication.   HISTORY OF PRESENT ILLNESS:  ALONDRA USEY is a 74 y.o. year old female with multiple medical problems including ESRD, CHF, Acute on chronic respiratory failure, debility. Palliative Care was asked to follow this patient by consultation request of Alvester Morin, MD [phone] [address]. [place],   [id number]    to help address advance care planning and goals of care. This is the initial visit.   CODE STATUS: TBD, has had CPR but advance directives state no life prolonging measures. Will continue to clarify.  PPS: 30% HOSPICE ELIGIBILITY/DIAGNOSIS: TBD  PAST MEDICAL HISTORY:  Past Medical History:  Diagnosis Date  . (HFpEF) heart failure with preserved ejection fraction ([place])    a. TTE 01/2014: nl LV sys fxn, no valvular abnormalities; b. TTE 11/16: nl EF, mild LVH;  c. 04/2019 Echo: EF 60-65%. DD. Nl RV fxn. Mod AS. 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  . Allergy   . Anemia of chronic disease   . Anxiety   . Aortic atherosclerosis ([place])   . Asthma   . Chronic back pain   . COPD (chronic obstructive pulmonary disease) ([place])   . Diabetes mellitus with complication ([place])   . ESRD on hemodialysis ([place])    a. Tues/Sat; b. 2/2 small kidneys  .  Essential hypertension   . Fistula    lower left arm  . GERD (gastroesophageal reflux disease)   . Gout   . History of exercise stress test    a. 01/2014: no evidence of ischemia; b. Lexiscan 08/2015: no sig ischemia, severe GI uptake artifact, low risk; c. CPET @ [facility] 09/2016: exercised 3  min 12 sec on bike without incline, 2.28 METs, VO2 of 8.1, 48% of predicted, indicating mod to sev functional impairment, evidence of blunted HR, stroke volume, and BP augmentation as well as ventilation-perfusion mismatch with exercise  . HLD (hyperlipidemia)   . Non-obstructive Carotid arterial disease ([place])    a. 12/2017: <50% bilat ICA dzs.  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 Severe aortic stenosis    a. by 09/2019 echo b. 04/2019 Echo: Mod AS.  Marland Kitchen Sleep apnea     SOCIAL HX:  Social History   Tobacco Use  . Smoking status: Never Smoker  . Smokeless tobacco: Never Used  Substance Use Topics  . Alcohol use: No    ALLERGIES:  Allergies  Allergen Reactions  . Enalapril Maleate Other (See Comments)    Other reaction(s): Headache  . Nitrofurantoin Swelling and Rash    Other Reaction: swelling of body  . Sulfamethoxazole-Trimethoprim Swelling  . 2,4-D Dimethylamine (Amisol) Rash and Other (See Comments)    Other Reaction: h/a  . Baclofen Other (See Comments) and Nausea Only    lightheadness ,drowsiness , muscle weakness , twitching in hands   . Neosporin [Neomycin-Bacitracin Zn-Polymyx] Other (See Comments) and Rash    Other Reaction: irritation Skin irritation   . Quinine Nausea And Vomiting, Rash and Other (See Comments)    Other Reaction: Vomiting, rash, h/a, vision  . Ultram [Tramadol] Palpitations  . Zocor [Simvastatin] Other (See Comments) and Rash    Other Reaction: muscle spasms Muscle pain and spasms  . Bactrim [Sulfamethoxazole-Trimethoprim] Swelling  . Levodopa Other (See Comments)    Reaction: unknown  . Macrodantin [Nitrofurantoin Macrocrystal] Swelling  . Quinine Derivatives Other (See Comments)    Vertigo,nausea vomiting blurred vision headache ears sensitivity      PERTINENT MEDICATIONS:  Outpatient Encounter Medications as of 74/05/2020  Medication Sig  . acetaminophen (TYLENOL) 500 MG tablet Take 1 tablet (500 mg total) by  mouth every 6 (six) hours as needed for mild pain or fever.  Marland Kitchen albuterol (PROVENTIL HFA;VENTOLIN HFA) 108 (90 Base) MCG/ACT inhaler Inhale 2 puffs into the lungs every 6 (six) hours as needed for wheezing or shortness of breath.  . ALPRAZolam (XANAX) 0.25 MG tablet Take 0.25 mg by mouth every 6 (six) hours as needed for anxiety.  Marland Kitchen amLODipine (NORVASC) 10 MG tablet Take 10 mg by mouth daily.   . benzonatate (TESSALON) 100 MG capsule Take 1 capsule (100 mg total) by mouth 2 (two) times daily as needed for cough.  . calcium acetate (PHOSLO) 667 MG capsule Take 1,334 mg by mouth 3 (three) times daily with meals.   . carvedilol (COREG) 12.5 MG tablet Take 12.5 mg by mouth 2 (two) times daily with a meal.   . cetirizine (ZYRTEC) 10 MG tablet Take 10 mg by mouth daily as needed for allergies.   Marland Kitchen clopidogrel (PLAVIX) 75 MG tablet Take 1 tablet (75 mg total) by mouth daily.  . Fluticasone-Salmeterol (ADVAIR) 250-50 MCG/DOSE AEPB Inhale 1 puff into the lungs 2 (two) times daily as needed (for shortness of breath).  . hydrALAZINE (APRESOLINE) 50 MG tablet  Take 1 tablet (50 mg total) by mouth every 8 (eight) hours.  Marland Kitchen HYDROcodone-acetaminophen (NORCO) 7.5-325 MG tablet Take 1 tablet by mouth every 6 (six) hours as needed (pain).  Marland Kitchen ipratropium-albuterol (DUONEB) 0.5-2.5 (3) MG/3ML SOLN Take 3 mLs by nebulization every 6 (six) hours as needed.  . lidocaine (LIDODERM) 5 % Place 1 patch onto the skin daily. Remove & Discard patch within 12 hours or as directed by MD  . lidocaine-prilocaine (EMLA) cream Apply 1 application topically as needed (prior to treatment).   . losartan (COZAAR) 50 MG tablet Take 50 mg by mouth daily.  . ondansetron (ZOFRAN) 4 MG tablet Take 4 mg by mouth every 8 (eight) hours as needed.  Marland Kitchen oxybutynin (DITROPAN XL) 15 MG 24 hr tablet Take 15 mg by mouth daily.  . pantoprazole (PROTONIX) 40 MG tablet Take 1 tablet (40 mg total) by mouth daily.  . pravastatin (PRAVACHOL) 40 MG tablet Take  40 mg by mouth at bedtime.   Marland Kitchen scopolamine (TRANSDERM-SCOP) 1 MG/3DAYS Place 1 patch onto the skin every 3 (three) days.  Marland Kitchen topiramate (TOPAMAX) 25 MG tablet Take 1 tablet (25 mg total) by mouth as needed (for headaches).  . torsemide (DEMADEX) 20 MG tablet Take 2 tablets (40 mg total) by mouth every Monday,Wednesday,Friday, and Sunday at 6 PM.   No facility-administered encounter medications on file as of 74/05/2020.    PHYSICAL EXAM / ROS:   Current and past weights: 126.2 lbs (BMI 26.3)  per snf record. Recorded in July as 128 lbs. 10/2018 = 132 lbs. (BMI = 27.7) General: NAD, frail appearing, overweight BMI Cardiovascular: no chest pain reported, no edema reported , frequent fluid overload Pulmonary: no cough, no increased SOB today, no DOE on conversation Abdomen: appetite 50%, endorses constipation, continent of bowel GU: denies dysuria, continent of urine, reports  frequent utis MSK:  no joint deformities reported, ambulatory with PT and  walker, no falls Skin: no rashes or wounds reported Neurological: Weakness, sleep is good, pain is occasional and chronic  Jason Coop, NP, [facility]

## 2019-11-28 ENCOUNTER — Non-Acute Institutional Stay: Payer: Medicare Other | Admitting: Primary Care

## 2019-12-04 ENCOUNTER — Inpatient Hospital Stay
Admission: EM | Admit: 2019-12-04 | Discharge: 2019-12-07 | DRG: 306 | Disposition: A | Discharge status: Skilled Nursing Facility | Payer: Medicare Other | Attending: Hospitalist | Admitting: Hospitalist

## 2019-12-04 ENCOUNTER — Emergency Department: Payer: Medicare Other

## 2019-12-04 DIAGNOSIS — Z888 Allergy status to other drugs, medicaments and biological substances status: Secondary | ICD-10-CM

## 2019-12-04 DIAGNOSIS — Z7902 Long term (current) use of antithrombotics/antiplatelets: Secondary | ICD-10-CM

## 2019-12-04 DIAGNOSIS — Z79899 Other long term (current) drug therapy: Secondary | ICD-10-CM

## 2019-12-04 DIAGNOSIS — I132 Hypertensive heart and chronic kidney disease with heart failure and with stage 5 chronic kidney disease, or end stage renal disease: Secondary | ICD-10-CM | POA: Diagnosis present

## 2019-12-04 DIAGNOSIS — J9601 Acute respiratory failure with hypoxia: Secondary | ICD-10-CM | POA: Diagnosis not present

## 2019-12-04 DIAGNOSIS — M549 Dorsalgia, unspecified: Secondary | ICD-10-CM | POA: Diagnosis present

## 2019-12-04 DIAGNOSIS — R8271 Bacteriuria: Secondary | ICD-10-CM

## 2019-12-04 DIAGNOSIS — K219 Gastro-esophageal reflux disease without esophagitis: Secondary | ICD-10-CM | POA: Diagnosis present

## 2019-12-04 DIAGNOSIS — Z9119 Patient's noncompliance with other medical treatment and regimen: Secondary | ICD-10-CM | POA: Diagnosis not present

## 2019-12-04 DIAGNOSIS — Z882 Allergy status to sulfonamides status: Secondary | ICD-10-CM | POA: Diagnosis not present

## 2019-12-04 DIAGNOSIS — N2581 Secondary hyperparathyroidism of renal origin: Secondary | ICD-10-CM | POA: Diagnosis present

## 2019-12-04 DIAGNOSIS — Z8673 Personal history of transient ischemic attack (TIA), and cerebral infarction without residual deficits: Secondary | ICD-10-CM | POA: Diagnosis not present

## 2019-12-04 DIAGNOSIS — I700 Atherosclerosis of aorta: Secondary | ICD-10-CM | POA: Diagnosis present

## 2019-12-04 DIAGNOSIS — I350 Nonrheumatic aortic (valve) stenosis: Secondary | ICD-10-CM | POA: Diagnosis present

## 2019-12-04 DIAGNOSIS — Z992 Dependence on renal dialysis: Secondary | ICD-10-CM | POA: Diagnosis not present

## 2019-12-04 DIAGNOSIS — M109 Gout, unspecified: Secondary | ICD-10-CM | POA: Diagnosis present

## 2019-12-04 DIAGNOSIS — J9600 Acute respiratory failure, unspecified whether with hypoxia or hypercapnia: Secondary | ICD-10-CM | POA: Diagnosis present

## 2019-12-04 DIAGNOSIS — I5032 Chronic diastolic (congestive) heart failure: Secondary | ICD-10-CM | POA: Diagnosis present

## 2019-12-04 DIAGNOSIS — Z20822 Contact with and (suspected) exposure to covid-19: Secondary | ICD-10-CM | POA: Diagnosis present

## 2019-12-04 DIAGNOSIS — E785 Hyperlipidemia, unspecified: Secondary | ICD-10-CM | POA: Diagnosis present

## 2019-12-04 DIAGNOSIS — G4733 Obstructive sleep apnea (adult) (pediatric): Secondary | ICD-10-CM | POA: Diagnosis present

## 2019-12-04 DIAGNOSIS — G8929 Other chronic pain: Secondary | ICD-10-CM | POA: Diagnosis present

## 2019-12-04 DIAGNOSIS — J9621 Acute and chronic respiratory failure with hypoxia: Secondary | ICD-10-CM | POA: Diagnosis present

## 2019-12-04 DIAGNOSIS — I2510 Atherosclerotic heart disease of native coronary artery without angina pectoris: Secondary | ICD-10-CM | POA: Diagnosis present

## 2019-12-04 DIAGNOSIS — I639 Cerebral infarction, unspecified: Secondary | ICD-10-CM | POA: Diagnosis present

## 2019-12-04 DIAGNOSIS — I10 Essential (primary) hypertension: Secondary | ICD-10-CM | POA: Diagnosis present

## 2019-12-04 DIAGNOSIS — Z885 Allergy status to narcotic agent status: Secondary | ICD-10-CM | POA: Diagnosis not present

## 2019-12-04 DIAGNOSIS — M940 Chondrocostal junction syndrome [Tietze]: Secondary | ICD-10-CM | POA: Diagnosis present

## 2019-12-04 DIAGNOSIS — N186 End stage renal disease: Secondary | ICD-10-CM

## 2019-12-04 DIAGNOSIS — Z8249 Family history of ischemic heart disease and other diseases of the circulatory system: Secondary | ICD-10-CM

## 2019-12-04 DIAGNOSIS — J441 Chronic obstructive pulmonary disease with (acute) exacerbation: Secondary | ICD-10-CM | POA: Diagnosis present

## 2019-12-04 DIAGNOSIS — Z8349 Family history of other endocrine, nutritional and metabolic diseases: Secondary | ICD-10-CM

## 2019-12-04 DIAGNOSIS — E1122 Type 2 diabetes mellitus with diabetic chronic kidney disease: Secondary | ICD-10-CM | POA: Diagnosis present

## 2019-12-04 DIAGNOSIS — Z79891 Long term (current) use of opiate analgesic: Secondary | ICD-10-CM

## 2019-12-04 DIAGNOSIS — D631 Anemia in chronic kidney disease: Secondary | ICD-10-CM | POA: Diagnosis present

## 2019-12-04 DIAGNOSIS — N3281 Overactive bladder: Secondary | ICD-10-CM | POA: Diagnosis present

## 2019-12-04 LAB — BLOOD GAS, VENOUS
Acid-Base Excess: 6.9 mmol/L — ABNORMAL HIGH (ref 0.0–2.0)
Bicarbonate: 32.5 mmol/L — ABNORMAL HIGH (ref 20.0–28.0)
Delivery systems: POSITIVE
FIO2: 0.45
O2 Saturation: 92 %
PEEP: 6 cmH2O
Pressure support: 12 cmH2O
pCO2, Ven: 49 mmHg (ref 44.0–60.0)
pH, Ven: 7.43 (ref 7.250–7.430)
pO2, Ven: 62 mmHg — ABNORMAL HIGH (ref 32.0–45.0)

## 2019-12-04 LAB — CBC WITH DIFFERENTIAL/PLATELET
Abs Immature Granulocytes: 0.04 10*3/uL (ref 0.00–0.07)
Basophils Absolute: 0 10*3/uL (ref 0.0–0.1)
Basophils Relative: 0 %
Eosinophils Absolute: 0.2 10*3/uL (ref 0.0–0.5)
Eosinophils Relative: 3 %
HCT: 34.1 % — ABNORMAL LOW (ref 36.0–46.0)
Hemoglobin: 11.1 g/dL — ABNORMAL LOW (ref 12.0–15.0)
Immature Granulocytes: 1 %
Lymphocytes Relative: 7 %
Lymphs Abs: 0.5 10*3/uL — ABNORMAL LOW (ref 0.7–4.0)
MCH: 29.3 pg (ref 26.0–34.0)
MCHC: 32.6 g/dL (ref 30.0–36.0)
MCV: 90 fL (ref 80.0–100.0)
Monocytes Absolute: 0.5 10*3/uL (ref 0.1–1.0)
Monocytes Relative: 7 %
Neutro Abs: 6.2 10*3/uL (ref 1.7–7.7)
Neutrophils Relative %: 82 %
Platelets: 175 10*3/uL (ref 150–400)
RBC: 3.79 MIL/uL — ABNORMAL LOW (ref 3.87–5.11)
RDW: 13.6 % (ref 11.5–15.5)
WBC: 7.5 10*3/uL (ref 4.0–10.5)
nRBC: 0 % (ref 0.0–0.2)

## 2019-12-04 LAB — BASIC METABOLIC PANEL
Anion gap: 12 (ref 5–15)
BUN: 15 mg/dL (ref 8–23)
CO2: 30 mmol/L (ref 22–32)
Calcium: 8.7 mg/dL — ABNORMAL LOW (ref 8.9–10.3)
Chloride: 96 mmol/L — ABNORMAL LOW (ref 98–111)
Creatinine, Ser: 3.4 mg/dL — ABNORMAL HIGH (ref 0.44–1.00)
GFR calc Af Amer: 15 mL/min — ABNORMAL LOW (ref >60)
GFR calc non Af Amer: 13 mL/min — ABNORMAL LOW (ref >60)
Glucose, Bld: 150 mg/dL — ABNORMAL HIGH (ref 70–99)
Potassium: 3.7 mmol/L (ref 3.5–5.1)
Sodium: 138 mmol/L (ref 135–145)

## 2019-12-04 LAB — TROPONIN I (HIGH SENSITIVITY)
Troponin I (High Sensitivity): 22 ng/L — ABNORMAL HIGH (ref <18)
Troponin I (High Sensitivity): 23 ng/L — ABNORMAL HIGH (ref <18)

## 2019-12-04 LAB — BRAIN NATRIURETIC PEPTIDE: B Natriuretic Peptide: 1007 pg/mL — ABNORMAL HIGH (ref 0.0–100.0)

## 2019-12-04 LAB — RESPIRATORY PANEL BY RT PCR (FLU A&B, COVID)
Influenza A by PCR: NEGATIVE
Influenza B by PCR: NEGATIVE
SARS Coronavirus 2 by RT PCR: NEGATIVE

## 2019-12-04 MED ORDER — IPRATROPIUM-ALBUTEROL 0.5-2.5 (3) MG/3ML IN SOLN
3.0000 mL | Freq: Once | RESPIRATORY_TRACT | Status: AC
  Administered 2019-12-04: 3 mL via RESPIRATORY_TRACT
  Filled 2019-12-04: qty 3

## 2019-12-04 MED ORDER — ENOXAPARIN SODIUM 40 MG/0.4ML ~~LOC~~ SOLN: 40.0000 mg | SUBCUTANEOUS | Status: DC

## 2019-12-04 MED ORDER — NITROGLYCERIN 0.4 MG SL SUBL
SUBLINGUAL_TABLET | SUBLINGUAL | Status: AC
  Filled 2019-12-04: qty 1

## 2019-12-04 MED ORDER — METHYLPREDNISOLONE SODIUM SUCC 125 MG IJ SOLR
125.0000 mg | Freq: Once | INTRAMUSCULAR | Status: AC
  Administered 2019-12-04: 125 mg via INTRAVENOUS
  Filled 2019-12-04: qty 2

## 2019-12-04 MED ORDER — NITROGLYCERIN 0.4 MG SL SUBL: 0.4000 mg | SUBLINGUAL_TABLET | SUBLINGUAL | Status: DC | PRN

## 2019-12-04 MED ORDER — MORPHINE SULFATE (PF) 4 MG/ML IV SOLN
4.0000 mg | Freq: Once | INTRAVENOUS | Status: AC
  Administered 2019-12-04: 4 mg via INTRAVENOUS
  Filled 2019-12-04: qty 1

## 2019-12-04 MED ORDER — HEPARIN SODIUM (PORCINE) 5000 UNIT/ML IJ SOLN
5000.0000 [IU] | Freq: Three times a day (TID) | INTRAMUSCULAR | Status: DC
  Administered 2019-12-05 – 2019-12-07 (×7): 5000 [IU] via SUBCUTANEOUS
  Filled 2019-12-04 (×7): qty 1

## 2019-12-04 NOTE — ED Notes (Signed)
RT called to transition pt from bipap to [place] at 6L per providers request

## 2019-12-04 NOTE — Progress Notes (Signed)
   12/04/19 2000  Clinical Encounter Type  Visited With Patient  Visit Type Initial  Referral From Other (Comment) (ED Sec)  Consult/Referral To Chaplain  Spiritual Encounters  Spiritual Needs Prayer (Due to patients status, said silent prayer outside door. )  Patient was brought in to ED for respiratory failure. Because of health condition, the chaplain said a prayer outside of the room.

## 2019-12-04 NOTE — ED Notes (Signed)
Pt received sublingual nitro 2023

## 2019-12-04 NOTE — ED Provider Notes (Signed)
[name] Emergency Department Provider Note   ____________________________________________   First MD Initiated Contact with Patient 12/04/19 2026     (approximate)  I have reviewed the triage vital signs and the nursing notes.   HISTORY  Chief Complaint Respiratory Distress    HPI Phyllis [name] is a 74 y.o. female with past medical history of ESRD on HD, COPD, CHF, diabetes who presents to the ED complaining of shortness of breath.  History is limited secondary to patient's respiratory distress.  Per EMS, patient had acute onset of shortness of breath within the past hour.  Upon arrival, she had significantly increased work of breathing with O2 sats in the 80s on room air.  She was subsequently placed on CPAP with improvement in both work of breathing and O2 sats to 96%.  Patient endorses some pain in the center of her chest, which she states has been going on longer than the shortness of breath.  She also reports receiving a full run of dialysis earlier today.  She denies any recent fevers or cough.        Past Medical History:  Diagnosis Date  . (HFpEF) heart failure with preserved ejection fraction ([place])    a. TTE 01/2014: nl LV sys fxn, no valvular abnormalities; b. TTE 11/16: nl EF, mild LVH;  c. 04/2019 Echo: EF 60-65%. DD. Nl RV fxn. Mod AS. 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  . Allergy   . Anemia of chronic disease   . Anxiety   . Aortic atherosclerosis ([place])   . Asthma   . Chronic back pain   . COPD (chronic obstructive pulmonary disease) ([place])   . Diabetes mellitus with complication ([place])   . ESRD on hemodialysis ([place])    a. Tues/Sat; b. 2/2 small kidneys  . Essential hypertension   . Fistula    lower left arm  . GERD (gastroesophageal reflux disease)   . Gout   . History of exercise stress test    a. 01/2014: no evidence of ischemia; b. Lexiscan 08/2015: no sig ischemia, severe GI uptake artifact, low risk; c.  CPET @ [facility] 09/2016: exercised 3 min 12 sec on bike without incline, 2.28 METs, VO2 of 8.1, 48% of predicted, indicating mod to sev functional impairment, evidence of blunted HR, stroke volume, and BP augmentation as well as ventilation-perfusion mismatch with exercise  . HLD (hyperlipidemia)   . Non-obstructive Carotid arterial disease ([place])    a. 12/2017: <50% bilat ICA dzs.  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 Severe aortic stenosis    a. by 09/2019 echo b. 04/2019 Echo: Mod AS.  Marland Kitchen Sleep apnea     Patient Active Problem List   Diagnosis Date Noted  . Acute respiratory failure ([place]) 10/23/2019  . COPD with acute exacerbation ([place]) 10/23/2019  . Elevated troponin 10/23/2019  . CAD (coronary artery disease) 10/23/2019  . COPD exacerbation ([place]) 10/23/2019  . Leukocytosis 10/23/2019  . Anxiety 10/23/2019  . Palliative care by specialist   . Goals of care, counseling/discussion   . Flash pulmonary edema ([place]) 10/14/2019  . Respiratory failure with hypoxia ([place]) 10/11/2019  . 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-stage renal disease) ([place])   . Acute on chronic respiratory failure with hypoxia ([place]) 10/01/2019  . Severe aortic valve stenosis   . Acute respiratory failure with hypoxemia ([place]) 09/20/2019  . COPD with acute bronchitis ([place]) 09/05/2019  . Congestive heart failure ([place]) 05/24/2019  .  Hyperkalemia 11/29/2018  . Chronic diastolic heart failure ([place]) 11/02/2018  . HTN (hypertension) 11/02/2018  . Uncontrolled hypertension 10/21/2018  . History of acute pulmonary edema 10/21/2018  . Pressure injury of skin 03/16/2018  . CVA (cerebral vascular accident) ([place]) 01/16/2018  . Aortic atherosclerosis ([place]) 12/11/2017  . Chest pain 09/18/2017  . Hyperlipidemia 08/06/2015  . Complication from renal dialysis device 04/12/2015  . SOB (shortness of breath) 02/01/2015  . ESRD (end stage renal disease) on dialysis ([place]) 02/01/2015  . Type 2 diabetes mellitus with other  specified complication ([place]) [id number]  . Asthma 02/01/2015  . Acute on chronic diastolic CHF (congestive heart failure) ([place]) 02/01/2015    Past Surgical History:  Procedure Laterality Date  . carpel tunnel    . GALLBLADDER SURGERY    . PERIPHERAL VASCULAR CATHETERIZATION N/A 04/12/2015   Procedure: A/V Shuntogram/Fistulagram;  Surgeon: Algernon Huxley, MD;  Location: [facility];  Service: Cardiovascular;  Laterality: N/A;  . PERIPHERAL VASCULAR CATHETERIZATION N/A 04/12/2015   Procedure: A/V Shunt Intervention;  Surgeon: Algernon Huxley, MD;  Location: [facility];  Service: Cardiovascular;  Laterality: N/A;  . PERIPHERAL VASCULAR CATHETERIZATION N/A 06/09/2015   Procedure: Dialysis/Perma Catheter Removal;  Surgeon: Katha Cabal, MD;  Location: [facility];  Service: Cardiovascular;  Laterality: N/A;  . RIGHT/LEFT HEART CATH AND CORONARY ANGIOGRAPHY N/A 10/14/2019   Procedure: RIGHT/LEFT HEART CATH AND CORONARY ANGIOGRAPHY;  Surgeon: Belva Crome, MD;  Location: [facility];  Service: Cardiovascular;  Laterality: N/A;    Prior to Admission medications   Medication Sig Start Date End Date Taking? Authorizing Provider  acetaminophen (TYLENOL) 500 MG tablet Take 1 tablet (500 mg total) by mouth every 6 (six) hours as needed for mild pain or fever. 09/29/19  Yes Danford, Suann Larry, MD  albuterol (PROVENTIL HFA;VENTOLIN HFA) 108 (90 Base) MCG/ACT inhaler Inhale 2 puffs into the lungs every 6 (six) hours as needed for wheezing or shortness of breath.   Yes [provider]  ALPRAZolam (XANAX) 0.25 MG tablet Take 0.25 mg by mouth every 6 (six) hours as needed for anxiety.   Yes [provider]  amLODipine (NORVASC) 10 MG tablet Take 10 mg by mouth daily.  12/29/13  Yes [provider]  benzonatate (TESSALON) 100 MG capsule Take 1 capsule (100 mg total) by mouth 2 (two) times daily as needed for cough. 10/17/19  Yes [facility], Tami Lin, PA   calcium acetate (PHOSLO) 667 MG capsule Take 1,334 mg by mouth 3 (three) times daily with meals.    Yes [provider]  carvedilol (COREG) 12.5 MG tablet Take 12.5 mg by mouth 2 (two) times daily with a meal.  01/09/14  Yes [provider]  cetirizine (ZYRTEC) 10 MG tablet Take 10 mg by mouth daily as needed for allergies.    Yes [provider]  clopidogrel (PLAVIX) 75 MG tablet Take 1 tablet (75 mg total) by mouth daily. 01/18/18  Yes Epifanio Lesches, MD  hydrALAZINE (APRESOLINE) 50 MG tablet Take 1 tablet (50 mg total) by mouth every 8 (eight) hours. 09/29/19  Yes Danford, Suann Larry, MD  HYDROcodone-acetaminophen (NORCO) 7.5-325 MG tablet Take 1 tablet by mouth every 6 (six) hours as needed (pain). 10/08/19  Yes Danford, Suann Larry, MD  ipratropium-albuterol (DUONEB) 0.5-2.5 (3) MG/3ML SOLN Take 3 mLs by nebulization every 6 (six) hours as needed. 10/17/19  Yes [facility], Tami Lin, PA  lidocaine (LIDODERM) 5 % Place 1 patch onto  the skin daily. Remove & Discard patch within 12 hours or as directed by MD 10/14/19  Yes Flora Lipps, MD  lidocaine-prilocaine (EMLA) cream Apply 1 application topically as needed (prior to treatment).    Yes [provider]  losartan (COZAAR) 50 MG tablet Take 50 mg by mouth daily. 03/18/19  Yes [provider]  ondansetron (ZOFRAN) 4 MG tablet Take 4 mg by mouth every 8 (eight) hours as needed. 05/20/19  Yes [provider]  oxybutynin (DITROPAN XL) 15 MG 24 hr tablet Take 15 mg by mouth daily. 12/24/17  Yes [provider]  pantoprazole (PROTONIX) 40 MG tablet Take 1 tablet (40 mg total) by mouth daily. 10/17/19 10/16/20 Yes [facility], Tami Lin, PA  topiramate (TOPAMAX) 25 MG tablet Take 1 tablet (25 mg total) by mouth as needed (for headaches). 09/07/19  Yes Sainani, Belia Heman, MD  Fluticasone-Salmeterol (ADVAIR) 250-50 MCG/DOSE AEPB Inhale 1 puff into the lungs 2 (two) times daily as needed (for  shortness of breath). Patient not taking: Reported on 12/04/2019 09/07/19   Henreitta Leber, MD  pravastatin (PRAVACHOL) 40 MG tablet Take 40 mg by mouth at bedtime.     [provider]  scopolamine (TRANSDERM-SCOP) 1 MG/3DAYS Place 1 patch onto the skin every 3 (three) days.    [provider]  torsemide (DEMADEX) 20 MG tablet Take 2 tablets (40 mg total) by mouth every Monday,Wednesday,Friday, and Sunday at 6 PM. Patient not taking: Reported on 12/04/2019 10/01/19   Edwin Dada, MD    Allergies Enalapril maleate; Nitrofurantoin; Sulfamethoxazole-trimethoprim; 2,4-d dimethylamine (amisol); Baclofen; Neosporin [neomycin-bacitracin zn-polymyx]; Quinine; Ultram [tramadol]; Zocor [simvastatin]; Bactrim [sulfamethoxazole-trimethoprim]; Levodopa; Macrodantin [nitrofurantoin macrocrystal]; and Quinine derivatives  Family History  Problem Relation Age of Onset  . Hypertension Mother   . Hyperlipidemia Mother   . Heart disease Father   . Heart attack Father 37  . Hypertension Father   . Hyperlipidemia Father   . Heart disease Brother        CABG   . Heart attack Brother   . Breast cancer Neg Hx     Social History Social History   Tobacco Use  . Smoking status: Never Smoker  . Smokeless tobacco: Never Used  Substance Use Topics  . Alcohol use: No  . Drug use: No    Review of Systems  Constitutional: No fever/chills Eyes: No visual changes. ENT: No sore throat. Cardiovascular: Positive for chest pain. Respiratory: Positive for shortness of breath. Gastrointestinal: No abdominal pain.  No nausea, no vomiting.  No diarrhea.  No constipation. Genitourinary: Negative for dysuria. Musculoskeletal: Negative for back pain. Skin: Negative for rash. Neurological: Negative for headaches, focal weakness or numbness.  ____________________________________________   PHYSICAL EXAM:  VITAL SIGNS: ED Triage Vitals [12/04/19 2023]  Enc Vitals Group     BP       Pulse Rate 91     Resp (!) 32     Temp      Temp Source Oral     SpO2 100 %     Weight      Height      Head Circumference      Peak Flow      Pain Score      Pain Loc      Pain Edu?      Excl. in [place]?     Constitutional: Alert and oriented. Eyes: Conjunctivae are normal. Head: Atraumatic. Nose: No congestion/rhinnorhea. Mouth/Throat: Mucous membranes are moist. Neck: Normal ROM Cardiovascular: Tachycardic,  regular rhythm. Grossly normal heart sounds. Respiratory: Tachypneic with increased respiratory effort.  No retractions. Lungs with wheezes and crackles throughout. Gastrointestinal: Soft and nontender. No distention. Genitourinary: deferred Musculoskeletal: No lower extremity tenderness nor edema. Neurologic:  Normal speech and language. No gross focal neurologic deficits are appreciated. Skin:  Skin is warm, dry and intact. No rash noted. Psychiatric: Mood and affect are normal. Speech and behavior are normal.  ____________________________________________   LABS (all labs ordered are listed, but only abnormal results are displayed)  Labs Reviewed  BASIC METABOLIC PANEL - Abnormal; Notable for the following components:      Result Value   Chloride 96 (*)    Glucose, Bld 150 (*)    Creatinine, Ser 3.40 (*)    Calcium 8.7 (*)    GFR calc non Af Amer 13 (*)    GFR calc Af Amer 15 (*)    All other components within normal limits  CBC WITH DIFFERENTIAL/PLATELET - Abnormal; Notable for the following components:   RBC 3.79 (*)    Hemoglobin 11.1 (*)    HCT 34.1 (*)    Lymphs Abs 0.5 (*)    All other components within normal limits  BRAIN NATRIURETIC PEPTIDE - Abnormal; Notable for the following components:   B Natriuretic Peptide 1,007.0 (*)    All other components within normal limits  BLOOD GAS, VENOUS - Abnormal; Notable for the following components:   pO2, Ven 62.0 (*)    Bicarbonate 32.5 (*)    Acid-Base Excess 6.9 (*)    All other components within normal  limits  TROPONIN I (HIGH SENSITIVITY) - Abnormal; Notable for the following components:   Troponin I (High Sensitivity) 22 (*)    All other components within normal limits  TROPONIN I (HIGH SENSITIVITY) - Abnormal; Notable for the following components:   Troponin I (High Sensitivity) 23 (*)    All other components within normal limits  RESPIRATORY PANEL BY RT PCR (FLU A&B, COVID)  URINALYSIS, COMPLETE (UACMP) WITH MICROSCOPIC  BASIC METABOLIC PANEL  CBC   ____________________________________________   PROCEDURES  Procedure(s) performed (including Critical Care):  Procedures   ____________________________________________   INITIAL IMPRESSION / ASSESSMENT AND PLAN / ED COURSE       74 year old female with history of COPD, CHF, and ESRD on HD presents to the ED in respiratory distress, noted to have a low O2 sats on room air by EMS with increased work of breathing.  She was transitioned to BiPAP shortly after arrival and this seems to be helping her respiratory distress.  Due to concern for pulmonary edema as well as her chest pain, she was given sublingual nitro with improvement in her chest pain and her breathing.  Chest x-ray does not show clear evidence of pulmonary edema and there is no evidence of pneumonia.  Due to this, I suspect most of her respiratory distress is secondary to COPD.  She was given multiple breathing treatments and steroids and continued to improve, able to be transitioned from BiPAP to 5 L nasal cannula.  Her tachypnea is much improved and she seems to remain stable on the nasal cannula.  Lab work reassuring, troponin very mildly elevated but is similar to her baseline and there is low suspicion for ACS.  Patient to be admitted for further management of apparent COPD exacerbation, COVID-19 testing is negative.      ____________________________________________   FINAL CLINICAL IMPRESSION(S) / ED DIAGNOSES  Final diagnoses:  Acute respiratory failure with  hypoxia ([place])  COPD exacerbation ([place])  ESRD on hemodialysis [facility])     ED Discharge Orders    None       Note:  This document was prepared using Dragon voice recognition software and may include unintentional dictation errors.   Blake Divine, MD 12/05/19 [phone]

## 2019-12-04 NOTE — ED Triage Notes (Signed)
BIB EMS from home for resp distress. Pt recently was in white [employer]. Pt has had 2 cardiac arrest in the past 2 months. CO SOB and wheezing 45 min ago. O2 sat in 80s on RA. ON NRB pt is 92%. Pt currently on C-PAP and 96%. WOB decreased. 20G LAC CBG 176, T 99.5F HX CHF, DM, dialysis pt(unsure of last dialysis date.

## 2019-12-05 ENCOUNTER — Other Ambulatory Visit: Payer: Self-pay

## 2019-12-05 DIAGNOSIS — R8271 Bacteriuria: Secondary | ICD-10-CM

## 2019-12-05 LAB — BASIC METABOLIC PANEL
Anion gap: 14 (ref 5–15)
BUN: 21 mg/dL (ref 8–23)
CO2: 26 mmol/L (ref 22–32)
Calcium: 8.7 mg/dL — ABNORMAL LOW (ref 8.9–10.3)
Chloride: 99 mmol/L (ref 98–111)
Creatinine, Ser: 4.03 mg/dL — ABNORMAL HIGH (ref 0.44–1.00)
GFR calc Af Amer: 12 mL/min — ABNORMAL LOW (ref >60)
GFR calc non Af Amer: 10 mL/min — ABNORMAL LOW (ref >60)
Glucose, Bld: 166 mg/dL — ABNORMAL HIGH (ref 70–99)
Potassium: 4.3 mmol/L (ref 3.5–5.1)
Sodium: 139 mmol/L (ref 135–145)

## 2019-12-05 LAB — URINALYSIS, COMPLETE (UACMP) WITH MICROSCOPIC
Bilirubin Urine: NEGATIVE
Glucose, UA: NEGATIVE mg/dL
Ketones, ur: NEGATIVE mg/dL
Nitrite: NEGATIVE
Protein, ur: 100 mg/dL — AB
RBC / HPF: >50 RBC/hpf — ABNORMAL HIGH (ref 0–5)
Specific Gravity, Urine: 1.008 (ref 1.005–1.030)
WBC, UA: >50 WBC/hpf — ABNORMAL HIGH (ref 0–5)
pH: 8 (ref 5.0–8.0)

## 2019-12-05 LAB — CBC
HCT: 30.9 % — ABNORMAL LOW (ref 36.0–46.0)
HCT: 27 % — ABNORMAL LOW (ref 36.0–46.0)
Hemoglobin: 10.1 g/dL — ABNORMAL LOW (ref 12.0–15.0)
Hemoglobin: 8.6 g/dL — ABNORMAL LOW (ref 12.0–15.0)
MCH: 28.9 pg (ref 26.0–34.0)
MCH: 28.9 pg (ref 26.0–34.0)
MCHC: 32.7 g/dL (ref 30.0–36.0)
MCHC: 31.9 g/dL (ref 30.0–36.0)
MCV: 88.3 fL (ref 80.0–100.0)
MCV: 90.6 fL (ref 80.0–100.0)
Platelets: 142 10*3/uL — ABNORMAL LOW (ref 150–400)
Platelets: 126 10*3/uL — ABNORMAL LOW (ref 150–400)
RBC: 3.5 MIL/uL — ABNORMAL LOW (ref 3.87–5.11)
RBC: 2.98 MIL/uL — ABNORMAL LOW (ref 3.87–5.11)
RDW: 13.3 % (ref 11.5–15.5)
RDW: 13.4 % (ref 11.5–15.5)
WBC: 4.7 10*3/uL (ref 4.0–10.5)
WBC: 4.8 10*3/uL (ref 4.0–10.5)
nRBC: 0 % (ref 0.0–0.2)
nRBC: 0 % (ref 0.0–0.2)

## 2019-12-05 MED ORDER — IPRATROPIUM-ALBUTEROL 0.5-2.5 (3) MG/3ML IN SOLN
3.0000 mL | Freq: Four times a day (QID) | RESPIRATORY_TRACT | Status: DC
  Administered 2019-12-05 – 2019-12-06 (×5): 3 mL via RESPIRATORY_TRACT
  Filled 2019-12-05 (×5): qty 3

## 2019-12-05 MED ORDER — LIDOCAINE 5 % EX PTCH
1.0000 | MEDICATED_PATCH | CUTANEOUS | Status: DC
  Administered 2019-12-05 – 2019-12-07 (×3): 1 via TRANSDERMAL
  Filled 2019-12-05 (×3): qty 1

## 2019-12-05 MED ORDER — MORPHINE SULFATE (PF) 2 MG/ML IV SOLN: 1.0000 mg | INTRAVENOUS | Status: DC | PRN

## 2019-12-05 MED ORDER — HYDROCODONE-ACETAMINOPHEN 7.5-325 MG PO TABS
1.0000 | ORAL_TABLET | Freq: Four times a day (QID) | ORAL | Status: DC | PRN
  Administered 2019-12-05 – 2019-12-07 (×6): 1 via ORAL
  Filled 2019-12-05 (×6): qty 1

## 2019-12-05 MED ORDER — CARVEDILOL 12.5 MG PO TABS
12.5000 mg | ORAL_TABLET | Freq: Two times a day (BID) | ORAL | Status: DC
  Administered 2019-12-05 – 2019-12-07 (×5): 12.5 mg via ORAL
  Filled 2019-12-05 (×5): qty 1

## 2019-12-05 MED ORDER — AMLODIPINE BESYLATE 10 MG PO TABS
10.0000 mg | ORAL_TABLET | Freq: Every day | ORAL | Status: DC
  Administered 2019-12-05 – 2019-12-07 (×3): 10 mg via ORAL
  Filled 2019-12-05 (×3): qty 1

## 2019-12-05 MED ORDER — OXYBUTYNIN CHLORIDE ER 5 MG PO TB24
15.0000 mg | ORAL_TABLET | Freq: Every day | ORAL | Status: DC
  Administered 2019-12-05 – 2019-12-07 (×3): 15 mg via ORAL
  Filled 2019-12-05 (×3): qty 3

## 2019-12-05 MED ORDER — LOSARTAN POTASSIUM 50 MG PO TABS
50.0000 mg | ORAL_TABLET | Freq: Every day | ORAL | Status: DC
  Administered 2019-12-05 – 2019-12-07 (×3): 50 mg via ORAL
  Filled 2019-12-05 (×3): qty 1

## 2019-12-05 MED ORDER — PANTOPRAZOLE SODIUM 40 MG PO TBEC
40.0000 mg | DELAYED_RELEASE_TABLET | Freq: Every day | ORAL | Status: DC
  Administered 2019-12-05 – 2019-12-07 (×3): 40 mg via ORAL
  Filled 2019-12-05 (×3): qty 1

## 2019-12-05 MED ORDER — ALPRAZOLAM 0.25 MG PO TABS
0.2500 mg | ORAL_TABLET | Freq: Four times a day (QID) | ORAL | Status: DC | PRN
  Administered 2019-12-05 – 2019-12-06 (×4): 0.25 mg via ORAL
  Filled 2019-12-05 (×4): qty 1

## 2019-12-05 MED ORDER — CALCIUM ACETATE (PHOS BINDER) 667 MG PO CAPS
1334.0000 mg | ORAL_CAPSULE | Freq: Three times a day (TID) | ORAL | Status: DC
  Filled 2019-12-05 (×3): qty 2

## 2019-12-05 MED ORDER — ONDANSETRON HCL 4 MG/2ML IJ SOLN
4.0000 mg | Freq: Four times a day (QID) | INTRAMUSCULAR | Status: DC | PRN
  Administered 2019-12-05: 4 mg via INTRAVENOUS
  Filled 2019-12-05: qty 2

## 2019-12-05 MED ORDER — ACETAMINOPHEN 500 MG PO TABS
500.0000 mg | ORAL_TABLET | Freq: Four times a day (QID) | ORAL | Status: DC | PRN
  Administered 2019-12-05 – 2019-12-06 (×2): 500 mg via ORAL
  Filled 2019-12-05: qty 1

## 2019-12-05 MED ORDER — CLOPIDOGREL BISULFATE 75 MG PO TABS
75.0000 mg | ORAL_TABLET | Freq: Every day | ORAL | Status: DC
  Administered 2019-12-05 – 2019-12-07 (×3): 75 mg via ORAL
  Filled 2019-12-05 (×3): qty 1

## 2019-12-05 MED ORDER — HYDRALAZINE HCL 50 MG PO TABS
50.0000 mg | ORAL_TABLET | Freq: Three times a day (TID) | ORAL | Status: DC
  Administered 2019-12-05 – 2019-12-07 (×8): 50 mg via ORAL
  Filled 2019-12-05 (×8): qty 1

## 2019-12-05 MED ORDER — METHYLPREDNISOLONE SODIUM SUCC 40 MG IJ SOLR
40.0000 mg | Freq: Every day | INTRAMUSCULAR | Status: DC
  Administered 2019-12-05 – 2019-12-06 (×2): 40 mg via INTRAVENOUS
  Filled 2019-12-05 (×2): qty 1

## 2019-12-05 MED ORDER — EPOETIN ALFA 10000 UNIT/ML IJ SOLN
10000.0000 [IU] | INTRAMUSCULAR | Status: DC
  Administered 2019-12-06: 10000 [IU] via INTRAVENOUS
  Filled 2019-12-05: qty 1

## 2019-12-05 MED ORDER — PRAVASTATIN SODIUM 20 MG PO TABS
40.0000 mg | ORAL_TABLET | Freq: Every day | ORAL | Status: DC
  Administered 2019-12-05 – 2019-12-06 (×2): 40 mg via ORAL
  Filled 2019-12-05 (×2): qty 2

## 2019-12-05 NOTE — ED Notes (Signed)
Transporter taking pt to floor

## 2019-12-05 NOTE — Progress Notes (Signed)
PROGRESS NOTE    Phyllis [name]  [id number] DOB: 1946-01-06 DOA: 12/04/2019  PCP: Alvester Morin, MD    LOS - 1   Brief Narrative:  Phyllis [name] is a 74 y.o. female with medical history significant of ESRD on HD tues/thurs/Sat, COPD, CVA, Chronic diastolic heart failure, severe aortic stenosis, prone to flash pulmonary edema, hx of PEA arrest in 09/2019 who presents with chest pain and shortness of breath.  Returned home from SNF day before, was in rehab after early December admission for acute hypoxic respiratory failure secondary to combined pulmonary edema, exacerbation of COPD, ESRD.  Patient has ongoing chest pain since having CPR, has lidocaine patch, but chest pain worsened after dialysis day of admission and breathing worsened.  In the ED, hypertensive 180/80, afebrile, hypoxic initially requiring BiPAP but weaned to 5 L/min nasal cannula.  No leukocytosis, stable anemia BNP 1007, troponins 22, 23   Subjective: Patient seen this morning.  She reports that her chest pain worsened when oxygen is turned down off.  Also worse with deep inspirations.  She denies fevers or chills or feeling.  Reports costochondritis from CPR usually associated with oxygen.  Assessment & Plan:   Principal Problem:   Acute respiratory failure (HCC) Active Problems:   ESRD (end stage renal disease) on dialysis ([place])   Hyperlipidemia   CVA (cerebral vascular accident) ([place])   Chronic diastolic heart failure (HCC)   HTN (hypertension)   Asymptomatic bacteriuria   Acute on chronic respiratory failure with hypoxia  Likely multifactorial, given her ongoing costochondritis and pain with inspiration, and chest x-ray showing low lung volumes, suspect patient not inhaling very well.  Also COPD and ESRD and aortic stenosis contributory. Per chart review, should be on 2 3 L/min, discharged home from SNF without oxygen. --Supplemental oxygen, maintain O2 sat > 90% --Continue steroids and  duo nebs  Chest pain -costochondritis secondary to recent CPR Troponins mildly elevated but flat chest pain not ischemic in nature, suspect demand ischemia versus ESRD. -Continue lidocaine patch -Low-dose morphine as needed for severe pain  Asymptomatic bacteriuria -avoid antibiotics  ESRD -on HD Tuesday/Thursday/Saturday -Nephrology consulted for dialysis   Chronic diastolic CHF -clinically euvolemic -Continue Coreg  History of CVA -continue Plavix  Essential hypertension -chronic -Continue home amlodipine, hydralazine, losartan  Hyperlipidemia -Continue home statin  DVT prophylaxis: Heparin   Code Status: Full Code  Family Communication: None at bedside Disposition Plan: Hopefully discharge home since she has just completed short-term rehab stay.  Pending clinical improvement and PT eval   Consultants:   Nephrology  Procedures: Include things that cannot be auto populated i.e. Echo, Carotid and venous dopplers, Foley, Bipap, HD, tubes/drains, wound vac, central lines etc)  None  Antimicrobials:   None   Objective: Vitals:   12/05/19 0012 12/05/19 0115 12/05/19 0150 12/05/19 0801  BP: (!) 132/115 (!) 164/55  (!) 154/53  Pulse: 86 78  70  Resp: 17   20  Temp:  98.2 F (36.8 C)  98.8 F (37.1 C)  TempSrc:  Oral  Oral  SpO2: 100% 100% 97% 100%  Weight:       No intake or output data in the 24 hours ending 12/05/19 0838 Filed Weights   12/04/19 2200  Weight: 59 kg    Examination:  General exam: awake, alert, no acute distress, obese HEENT: moist mucus membranes, hearing grossly normal  Respiratory system: Diminished bilaterally, no wheezes, rales or rhonchi, normal respiratory effort. Cardiovascular system: normal S1/S2, RRR, no JVD,  murmurs, rubs, gallops, no pedal edema.   Central nervous system: alert and oriented x4. no gross focal neurologic deficits, normal speech Extremities: moves all, no edema, normal tone Skin: dry, intact, normal  temperature Psychiatry: normal mood, congruent affect, judgement and insight appear normal    Data Reviewed: I have personally reviewed following labs and imaging studies  CBC: Recent Labs  Lab 12/04/19 2032 12/05/19 0643  WBC 7.5 4.7  NEUTROABS 6.2  --   HGB 11.1* 10.1*  HCT 34.1* 30.9*  MCV 90.0 88.3  PLT 175 [id number]*   Basic Metabolic Panel: Recent Labs  Lab 12/04/19 2032 12/05/19 0643  NA 138 139  K 3.7 4.3  CL 96* 99  CO2 30 26  GLUCOSE 150* 166*  BUN 15 21  CREATININE 3.40* 4.03*  CALCIUM 8.7* 8.7*   GFR: Estimated Creatinine Clearance: 9.4 mL/min (A) (by C-G formula based on SCr of 4.03 mg/dL (H)). Liver Function Tests: No results for input(s): AST, ALT, ALKPHOS, BILITOT, PROT, ALBUMIN in the last 168 hours. No results for input(s): LIPASE, AMYLASE in the last 168 hours. No results for input(s): AMMONIA in the last 168 hours. Coagulation Profile: No results for input(s): INR, PROTIME in the last 168 hours. Cardiac Enzymes: No results for input(s): CKTOTAL, CKMB, CKMBINDEX, TROPONINI in the last 168 hours. BNP (last 3 results) No results for input(s): PROBNP in the last 8760 hours. HbA1C: No results for input(s): HGBA1C in the last 72 hours. CBG: No results for input(s): GLUCAP in the last 168 hours. Lipid Profile: No results for input(s): CHOL, HDL, LDLCALC, TRIG, CHOLHDL, LDLDIRECT in the last 72 hours. Thyroid Function Tests: No results for input(s): TSH, T4TOTAL, FREET4, T3FREE, THYROIDAB in the last 72 hours. Anemia Panel: No results for input(s): VITAMINB12, FOLATE, FERRITIN, TIBC, IRON, RETICCTPCT in the last 72 hours. Sepsis Labs: No results for input(s): PROCALCITON, LATICACIDVEN in the last 168 hours.  Recent Results (from the past 240 hour(s))  Respiratory Panel by RT PCR (Flu A&B, Covid) - Nasopharyngeal Swab     Status: None   Collection Time: 12/04/19  8:33 PM   Specimen: Nasopharyngeal Swab  Result Value Ref Range Status   SARS  Coronavirus 2 by RT PCR NEGATIVE NEGATIVE Final    Comment: (NOTE) SARS-CoV-2 target nucleic acids are NOT DETECTED. The SARS-CoV-2 RNA is generally detectable in upper respiratoy specimens during the acute phase of infection. The lowest concentration of SARS-CoV-2 viral copies this assay can detect is 131 copies/mL. A negative result does not preclude SARS-Cov-2 infection and should not be used as the sole basis for treatment or other patient management decisions. A negative result may occur with  improper specimen collection/handling, submission of specimen other than nasopharyngeal swab, presence of viral mutation(s) within the areas targeted by this assay, and inadequate number of viral copies (<131 copies/mL). A negative result must be combined with clinical observations, patient history, and epidemiological information. The expected result is Negative. Fact Sheet for Patients:  PinkCheek.be Fact Sheet for Healthcare Providers:  GravelBags.it This test is not yet ap proved or cleared by the [place] FDA and  has been authorized for detection and/or diagnosis of SARS-CoV-2 by FDA under an Emergency Use Authorization (EUA). This EUA will remain  in effect (meaning this test can be used) for the duration of the COVID-19 declaration under Section 564(b)(1) of the Act, 21 U.S.C. section 360bbb-3(b)(1), unless the authorization is terminated or revoked sooner.    Influenza A by PCR NEGATIVE NEGATIVE Final  Influenza B by PCR NEGATIVE NEGATIVE Final    Comment: (NOTE) The Xpert Xpress SARS-CoV-2/FLU/RSV assay is intended as an aid in  the diagnosis of influenza from Nasopharyngeal swab specimens and  should not be used as a sole basis for treatment. Nasal washings and  aspirates are unacceptable for Xpert Xpress SARS-CoV-2/FLU/RSV  testing. Fact Sheet for Patients: PinkCheek.be Fact Sheet  for Healthcare Providers: GravelBags.it This test is not yet approved or cleared by the [place] FDA and  has been authorized for detection and/or diagnosis of SARS-CoV-2 by  FDA under an Emergency Use Authorization (EUA). This EUA will remain  in effect (meaning this test can be used) for the duration of the  Covid-19 declaration under Section 564(b)(1) of the Act, 21  U.S.C. section 360bbb-3(b)(1), unless the authorization is  terminated or revoked. Performed at [facility], [address], [place], [place] [address]          Radiology Studies: DG Chest Portable 1 View  Result Date: 12/04/2019 CLINICAL DATA:  Shortness of breath, wheezing, respiratory distress EXAM: PORTABLE CHEST 1 VIEW COMPARISON:  10/23/2019 FINDINGS: Heart is upper limits normal in size. Peribronchial thickening and interstitial prominence. No confluent opacities or effusions. Aortic atherosclerosis. No acute bony abnormality. IMPRESSION: Bronchitic changes. Electronically Signed   By: Rolm Baptise M.D.   On: 12/04/2019 20:43        Scheduled Meds: . amLODipine  10 mg Oral Daily  . calcium acetate  1,334 mg Oral TID WC  . carvedilol  12.5 mg Oral BID WC  . clopidogrel  75 mg Oral Daily  . heparin injection (subcutaneous)  5,000 Units Subcutaneous Q8H  . hydrALAZINE  50 mg Oral Q8H  . ipratropium-albuterol  3 mL Nebulization Q6H  . lidocaine  1 patch Transdermal Q24H  . losartan  50 mg Oral Daily  . methylPREDNISolone (SOLU-MEDROL) injection  40 mg Intravenous Daily  . oxybutynin  15 mg Oral Daily  . pantoprazole  40 mg Oral Daily  . pravastatin  40 mg Oral QHS   Continuous Infusions:   LOS: 1 day    Time spent: 35 minutes    Ezekiel Slocumb, DO [facility]   If 7PM-7AM, please contact night-coverage [email or web address] Password [facility] 12/05/2019, 8:38 AM

## 2019-12-05 NOTE — Progress Notes (Signed)
[facility]  ROUNDING NOTE   Subjective:   [name] admitted to [facility] on 12/04/2019 for COPD exacerbation ([place]) [J44.1] Acute respiratory failure with hypoxia ([place]) [J96.01] ESRD on hemodialysis ([place]) [N18.6, Z99.2]  Patient's last hemodialysis treatment was yesterday. Patient was taken down to her dry weight of 58kg.   She states she has been keeping a fluid restriction.   Objective:  Vital signs in last 24 hours:  Temp:  [96.8 F (36 C)-98.8 F (37.1 C)] 98.8 F (37.1 C) (01/15 0801) Pulse Rate:  [70-92] 70 (01/15 0801) Resp:  [17-32] 20 (01/15 0801) BP: (132-182)/(53-115) 154/53 (01/15 0801) SpO2:  [96 %-100 %] 100 % (01/15 0854) Weight:  [59 kg] 59 kg (01/14 2200)  Weight change:  Filed Weights   12/04/19 2200  Weight: 59 kg    Intake/Output: No intake/output data recorded.   Intake/Output this shift:  No intake/output data recorded.  Physical Exam: General: NAD, laying in bed  Head: Normocephalic, atraumatic. Moist oral mucosal membranes  Eyes: Anicteric, PERRL  Neck: Supple, trachea midline  Lungs:  Clear to auscultation  Heart: Regular rate and rhythm, +murmur  Abdomen:  Soft, nontender, obese  Extremities: No peripheral edema.  Neurologic: Nonfocal, moving all four extremities  Skin: No lesions  Access: Left AVF    Basic Metabolic Panel: Recent Labs  Lab 12/04/19 2032 12/05/19 0643  NA 138 139  K 3.7 4.3  CL 96* 99  CO2 30 26  GLUCOSE 150* 166*  BUN 15 21  CREATININE 3.40* 4.03*  CALCIUM 8.7* 8.7*    Liver Function Tests: No results for input(s): AST, ALT, ALKPHOS, BILITOT, PROT, ALBUMIN in the last 168 hours. No results for input(s): LIPASE, AMYLASE in the last 168 hours. No results for input(s): AMMONIA in the last 168 hours.  CBC: Recent Labs  Lab 12/04/19 2032 12/05/19 0643  WBC 7.5 4.7  NEUTROABS 6.2  --   HGB 11.1* 10.1*  HCT 34.1* 30.9*  MCV 90.0 88.3  PLT 175 142*    Cardiac Enzymes: No  results for input(s): CKTOTAL, CKMB, CKMBINDEX, TROPONINI in the last 168 hours.  BNP: Invalid input(s): POCBNP  CBG: No results for input(s): GLUCAP in the last 168 hours.  Microbiology: Results for orders placed or performed during the hospital encounter of 12/04/19  Respiratory Panel by RT PCR (Flu A&B, Covid) - Nasopharyngeal Swab     Status: None   Collection Time: 12/04/19  8:33 PM   Specimen: Nasopharyngeal Swab  Result Value Ref Range Status   SARS Coronavirus 2 by RT PCR NEGATIVE NEGATIVE Final    Comment: (NOTE) SARS-CoV-2 target nucleic acids are NOT DETECTED. The SARS-CoV-2 RNA is generally detectable in upper respiratoy specimens during the acute phase of infection. The lowest concentration of SARS-CoV-2 viral copies this assay can detect is 131 copies/mL. A negative result does not preclude SARS-Cov-2 infection and should not be used as the sole basis for treatment or other patient management decisions. A negative result may occur with  improper specimen collection/handling, submission of specimen other than nasopharyngeal swab, presence of viral mutation(s) within the areas targeted by this assay, and inadequate number of viral copies (<131 copies/mL). A negative result must be combined with clinical observations, patient history, and epidemiological information. The expected result is Negative. Fact Sheet for Patients:  PinkCheek.be Fact Sheet for Healthcare Providers:  GravelBags.it This test is not yet ap proved or cleared by the [place] FDA and  has been authorized for detection  and/or diagnosis of SARS-CoV-2 by FDA under an Emergency Use Authorization (EUA). This EUA will remain  in effect (meaning this test can be used) for the duration of the COVID-19 declaration under Section 564(b)(1) of the Act, 21 U.S.C. section 360bbb-3(b)(1), unless the authorization is terminated or revoked sooner.     Influenza A by PCR NEGATIVE NEGATIVE Final   Influenza B by PCR NEGATIVE NEGATIVE Final    Comment: (NOTE) The Xpert Xpress SARS-CoV-2/FLU/RSV assay is intended as an aid in  the diagnosis of influenza from Nasopharyngeal swab specimens and  should not be used as a sole basis for treatment. Nasal washings and  aspirates are unacceptable for Xpert Xpress SARS-CoV-2/FLU/RSV  testing. Fact Sheet for Patients: PinkCheek.be Fact Sheet for Healthcare Providers: GravelBags.it This test is not yet approved or cleared by the [place] FDA and  has been authorized for detection and/or diagnosis of SARS-CoV-2 by  FDA under an Emergency Use Authorization (EUA). This EUA will remain  in effect (meaning this test can be used) for the duration of the  Covid-19 declaration under Section 564(b)(1) of the Act, 21  U.S.C. section 360bbb-3(b)(1), unless the authorization is  terminated or revoked. Performed at [facility], [address] [place], [place] [address]     Coagulation Studies: No results for input(s): LABPROT, INR in the last 72 hours.  Urinalysis: Recent Labs    12/04/19 2054  COLORURINE YELLOW*  LABSPEC 1.008  PHURINE 8.0  GLUCOSEU NEGATIVE  HGBUR MODERATE*  BILIRUBINUR NEGATIVE  KETONESUR NEGATIVE  PROTEINUR 100*  NITRITE NEGATIVE  LEUKOCYTESUR LARGE*      Imaging: DG Chest Portable 1 View  Result Date: 12/04/2019 CLINICAL DATA:  Shortness of breath, wheezing, respiratory distress EXAM: PORTABLE CHEST 1 VIEW COMPARISON:  10/23/2019 FINDINGS: Heart is upper limits normal in size. Peribronchial thickening and interstitial prominence. No confluent opacities or effusions. Aortic atherosclerosis. No acute bony abnormality. IMPRESSION: Bronchitic changes. Electronically Signed   By: Rolm Baptise M.D.   On: 12/04/2019 20:43     Medications:    . amLODipine  10 mg Oral Daily  . calcium acetate  1,334 mg  Oral TID WC  . carvedilol  12.5 mg Oral BID WC  . clopidogrel  75 mg Oral Daily  . heparin injection (subcutaneous)  5,000 Units Subcutaneous Q8H  . hydrALAZINE  50 mg Oral Q8H  . ipratropium-albuterol  3 mL Nebulization Q6H  . lidocaine  1 patch Transdermal Q24H  . losartan  50 mg Oral Daily  . methylPREDNISolone (SOLU-MEDROL) injection  40 mg Intravenous Daily  . oxybutynin  15 mg Oral Daily  . pantoprazole  40 mg Oral Daily  . pravastatin  40 mg Oral QHS   acetaminophen, ALPRAZolam, HYDROcodone-acetaminophen, morphine injection, ondansetron (ZOFRAN) IV  Assessment/ Plan:  Phyllis [name] is a 74 y.o. whtie female with end stage renal disease on hemodialysis, aortic stenosis, hypertension, chronic costochondritis, COPD, diabetes mellitus type II, overactive bladder, gout, GERD who is admitted to [facility] on 12/04/2019 for COPD exacerbation ([place]) [J44.1] Acute respiratory failure with hypoxia ([place]) [J96.01] ESRD on hemodialysis ([place]) [N18.6, Z99.2]  [facility] TTS [address] Left AVF 58kg   1. End Stage Renal Disease: last hemodialysis treatment was yesterday as an outpatient. No indication for dialysis today.  - TTS schedule  2. Hypertension: 154/53. home regimen of torsemide, losartan, carvedilol, amlodipine, hydralazine.   3. Anemia of chronic kidney disease: hemoglobin 10.1 - EPO with HD treatment.   4. Secondary Hyperparathyroidism: labs  from 12/01/2018: PTH 308, phos 4.6, and calcium 9.1. Has not been taking calcium acetate at home.  - hold calcium acetate    LOS: 1 Tyresha Fede 1/15/202110:20 AM

## 2019-12-05 NOTE — Progress Notes (Signed)
Pt was weaned to RA and was 100% this morning. About 30 minutes later she called saying she felt SOB. 2L reapplied. Oxygen saturation was still 100%. Pt shortly after requested to be increased to 5L for continued SOB and for comfort. Dr. Jasmine Pang was notified of this on rounds.

## 2019-12-05 NOTE — ED Notes (Signed)
Report given to 1A RN 

## 2019-12-05 NOTE — H&P (Signed)
History and Physical    Phyllis [name] [id number] DOB: May 09, 1946 DOA: 12/04/2019  PCP: Alvester Morin, MD  Patient coming from: Home  I have personally briefly reviewed patient's old medical records in [place]  Chief Complaint: chest pain  HPI: Phyllis [name] is a 74 y.o. female with medical history significant of ESRD on HD tues/thurs/Sat, COPD, CVA, Chronic diastolic heart failure, severe aortic valve stenosis, flash pulmonary edema, hx of PEA arrest in 09/2019 who presents with chest pain and shortness of breath.   Patient just returned home from nursing facility yesterday. She was there to due recent admission early December for acute hypoxic respiratory failure. She was on 2L there but was sent home without oxygen. Had dialysis today and then had worsening mid-sternal chest pain. Has been having chest pain since her cardiac arrest and chest compression for about a month. Pain improves with anxiety medication at times. Has on Lidoderm patch today placed by nursing facility.  Felt some shortness of breath last night. No PND or orthopnea. No LE edema. No worsening cough or sputum production. No nausea, vomiting or diarrhea.   Pt has had multiple evaluation in the ER for the past few months and is being followed by palliative care outpatient.   Denies tobacco, alcohol or illicit drug use.   ED Course: She was afebrile, hypertensive up to 180/80. Initially required Bipap on arrival but was able to wean down to 5L via [place].  WBC of 7.5, hemoglobin of 11.1. Glucose of 150, creatinine of 3.4 from a baseline of 5-6. BNP of 1007. Troponin at 22 and 23 UA shows large leukocytes and negative nitrates with few bacteria.   Review of Systems:  Constitutional: No Weight Change, No Fever ENT/Mouth: No sore throat, No Rhinorrhea Eyes: No Vision Changes Cardiovascular: + Chest Pain, + SOB Respiratory: No Cough, No Sputum, No Wheezing, no Dyspnea  Gastrointestinal: No  Nausea, No Vomiting GU: no dysuria Musculoskeletal: No Arthralgias, No Myalgias Skin: No Skin Lesions, No Pruritus, Neuro: no Weakness, No Numbness,  No Loss of Consciousness, No Syncope Psych: No Anxiety/Panic, No Depression, no decrease appetite Heme/Lymph: No Bruising, No Bleeding  Past Medical History:  Diagnosis Date  . (HFpEF) heart failure with preserved ejection fraction ([place])    a. TTE 01/2014: nl LV sys fxn, no valvular abnormalities; b. TTE 11/16: nl EF, mild LVH;  c. 04/2019 Echo: EF 60-65%. DD. Nl RV fxn. Mod AS. 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  . Allergy   . Anemia of chronic disease   . Anxiety   . Aortic atherosclerosis ([place])   . Asthma   . Chronic back pain   . COPD (chronic obstructive pulmonary disease) ([place])   . Diabetes mellitus with complication ([place])   . ESRD on hemodialysis ([place])    a. Tues/Sat; b. 2/2 small kidneys  . Essential hypertension   . Fistula    lower left arm  . GERD (gastroesophageal reflux disease)   . Gout   . History of exercise stress test    a. 01/2014: no evidence of ischemia; b. Lexiscan 08/2015: no sig ischemia, severe GI uptake artifact, low risk; c. CPET @ [facility] 09/2016: exercised 3 min 12 sec on bike without incline, 2.28 METs, VO2 of 8.1, 48% of predicted, indicating mod to sev functional impairment, evidence of blunted HR, stroke volume, and BP augmentation as well as ventilation-perfusion mismatch with exercise  . HLD (hyperlipidemia)   . Non-obstructive Carotid arterial disease ([place])  a. 12/2017: <50% bilat ICA dzs.  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 Severe aortic stenosis    a. by 09/2019 echo b. 04/2019 Echo: Mod AS.  Marland Kitchen Sleep apnea     Past Surgical History:  Procedure Laterality Date  . carpel tunnel    . GALLBLADDER SURGERY    . PERIPHERAL VASCULAR CATHETERIZATION N/A 04/12/2015   Procedure: A/V Shuntogram/Fistulagram;  Surgeon: Algernon Huxley, MD;  Location: [facility];  Service: Cardiovascular;  Laterality: N/A;  . PERIPHERAL VASCULAR CATHETERIZATION N/A 04/12/2015   Procedure: A/V Shunt Intervention;  Surgeon: Algernon Huxley, MD;  Location: [facility];  Service: Cardiovascular;  Laterality: N/A;  . PERIPHERAL VASCULAR CATHETERIZATION N/A 06/09/2015   Procedure: Dialysis/Perma Catheter Removal;  Surgeon: Katha Cabal, MD;  Location: [facility];  Service: Cardiovascular;  Laterality: N/A;  . RIGHT/LEFT HEART CATH AND CORONARY ANGIOGRAPHY N/A 10/14/2019   Procedure: RIGHT/LEFT HEART CATH AND CORONARY ANGIOGRAPHY;  Surgeon: Belva Crome, MD;  Location: [facility];  Service: Cardiovascular;  Laterality: N/A;     reports that she has never smoked. She has never used smokeless tobacco. She reports that she does not drink alcohol or use drugs.  Allergies  Allergen Reactions  . Enalapril Maleate Other (See Comments)    Other reaction(s): Headache  . Nitrofurantoin Swelling and Rash    Other Reaction: swelling of body  . Sulfamethoxazole-Trimethoprim Swelling  . 2,4-D Dimethylamine (Amisol) Rash and Other (See Comments)    Other Reaction: h/a  . Baclofen Other (See Comments) and Nausea Only    lightheadness ,drowsiness , muscle weakness , twitching in hands   . Neosporin [Neomycin-Bacitracin Zn-Polymyx] Other (See Comments) and Rash    Other Reaction: irritation Skin irritation   . Quinine Nausea And Vomiting, Rash and Other (See Comments)    Other Reaction: Vomiting, rash, h/a, vision  . Ultram [Tramadol] Palpitations  . Zocor [Simvastatin] Other (See Comments) and Rash    Other Reaction: muscle spasms Muscle pain and spasms  . Bactrim [Sulfamethoxazole-Trimethoprim] Swelling  . Levodopa Other (See Comments)    Reaction: unknown  . Macrodantin [Nitrofurantoin Macrocrystal] Swelling  . Quinine Derivatives Other (See Comments)    Vertigo,nausea vomiting blurred vision headache ears sensitivity     Family History    Problem Relation Age of Onset  . Hypertension Mother   . Hyperlipidemia Mother   . Heart disease Father   . Heart attack Father 81  . Hypertension Father   . Hyperlipidemia Father   . Heart disease Brother        CABG   . Heart attack Brother   . Breast cancer Neg Hx      Prior to Admission medications   Medication Sig Start Date End Date Taking? Authorizing Provider  acetaminophen (TYLENOL) 500 MG tablet Take 1 tablet (500 mg total) by mouth every 6 (six) hours as needed for mild pain or fever. 09/29/19  Yes Danford, Suann Larry, MD  albuterol (PROVENTIL HFA;VENTOLIN HFA) 108 (90 Base) MCG/ACT inhaler Inhale 2 puffs into the lungs every 6 (six) hours as needed for wheezing or shortness of breath.   Yes [provider]  ALPRAZolam (XANAX) 0.25 MG tablet Take 0.25 mg by mouth every 6 (six) hours as needed for anxiety.   Yes [provider]  amLODipine (NORVASC) 10 MG tablet Take 10 mg by mouth daily.  12/29/13  Yes [provider]  benzonatate (TESSALON) 100 MG capsule Take 1 capsule (100 mg total) by mouth 2 (two) times daily as needed for cough. 10/17/19  Yes [facility], Tami Lin, PA  calcium acetate (PHOSLO) 667 MG capsule Take 1,334 mg by mouth 3 (three) times daily with meals.    Yes [provider]  carvedilol (COREG) 12.5 MG tablet Take 12.5 mg by mouth 2 (two) times daily with a meal.  01/09/14  Yes [provider]  cetirizine (ZYRTEC) 10 MG tablet Take 10 mg by mouth daily as needed for allergies.    Yes [provider]  clopidogrel (PLAVIX) 75 MG tablet Take 1 tablet (75 mg total) by mouth daily. 01/18/18  Yes Epifanio Lesches, MD  hydrALAZINE (APRESOLINE) 50 MG tablet Take 1 tablet (50 mg total) by mouth every 8 (eight) hours. 09/29/19  Yes Danford, Suann Larry, MD  HYDROcodone-acetaminophen (NORCO) 7.5-325 MG tablet Take 1 tablet by mouth every 6 (six) hours as needed (pain). 10/08/19  Yes Danford, Suann Larry, MD   ipratropium-albuterol (DUONEB) 0.5-2.5 (3) MG/3ML SOLN Take 3 mLs by nebulization every 6 (six) hours as needed. 10/17/19  Yes [facility], Tami Lin, PA  lidocaine (LIDODERM) 5 % Place 1 patch onto the skin daily. Remove & Discard patch within 12 hours or as directed by MD 10/14/19  Yes Flora Lipps, MD  lidocaine-prilocaine (EMLA) cream Apply 1 application topically as needed (prior to treatment).    Yes [provider]  losartan (COZAAR) 50 MG tablet Take 50 mg by mouth daily. 03/18/19  Yes [provider]  ondansetron (ZOFRAN) 4 MG tablet Take 4 mg by mouth every 8 (eight) hours as needed. 05/20/19  Yes [provider]  oxybutynin (DITROPAN XL) 15 MG 24 hr tablet Take 15 mg by mouth daily. 12/24/17  Yes [provider]  pantoprazole (PROTONIX) 40 MG tablet Take 1 tablet (40 mg total) by mouth daily. 10/17/19 10/16/20 Yes [facility], Tami Lin, PA  topiramate (TOPAMAX) 25 MG tablet Take 1 tablet (25 mg total) by mouth as needed (for headaches). 09/07/19  Yes Sainani, Belia Heman, MD  Fluticasone-Salmeterol (ADVAIR) 250-50 MCG/DOSE AEPB Inhale 1 puff into the lungs 2 (two) times daily as needed (for shortness of breath). Patient not taking: Reported on 12/04/2019 09/07/19   Henreitta Leber, MD  pravastatin (PRAVACHOL) 40 MG tablet Take 40 mg by mouth at bedtime.     [provider]  scopolamine (TRANSDERM-SCOP) 1 MG/3DAYS Place 1 patch onto the skin every 3 (three) days.    [provider]  torsemide (DEMADEX) 20 MG tablet Take 2 tablets (40 mg total) by mouth every Monday,Wednesday,Friday, and Sunday at 6 PM. Patient not taking: Reported on 12/04/2019 10/01/19   Edwin Dada, MD    Physical Exam: Vitals:   12/04/19 2300 12/05/19 0000 12/05/19 0006 12/05/19 0012  BP:   (!) 154/96 (!) 132/115  Pulse: 83 73 83 86  Resp: (!) 23 18  17   Temp:   98.4 F (36.9 C)   TempSrc:   Oral   SpO2: 100% 100% 100% 100%  Weight:        Constitutional:  ill appearing female with generalized pallor laying flat in bed Vitals:   12/04/19 2300 12/05/19 0000 12/05/19 0006 12/05/19 0012  BP:   (!) 154/96 (!) 132/115  Pulse: 83 73 83 86  Resp: (!) 23 18  17   Temp:   98.4 F (36.9 C)   TempSrc:   Oral   SpO2: 100% 100% 100% 100%  Weight:       Eyes: PERRL, lids and conjunctivae normal, small bruise below the right eye ENMT: Mucous membranes are moist.  Neck: normal, supple Respiratory: poor aeration throughout on 5L via [place] , no wheezing, no crackles. Normal respiratory effort. Able to speak full sentences. Cardiovascular: Regular rate and rhythm, no murmurs / rubs / gallops. No extremity edema. Lidoderm on anterior right chest with significant tenderness on palpation. Abdomen: no tenderness, no masses palpated.  Bowel sounds positive.  Musculoskeletal: no clubbing / cyanosis. No joint deformity upper and lower extremities. Good ROM, no contractures. Normal muscle tone.  Skin: no rashes, lesions, ulcers. No induration Neurologic: CN 2-12 grossly intact. Sensation intact. Strength 5/5 in all 4.  Psychiatric: Normal judgment and insight. Alert and oriented x 3. Normal mood.    Labs on Admission: I have personally reviewed following labs and imaging studies  CBC: Recent Labs  Lab 12/04/19 2032  WBC 7.5  NEUTROABS 6.2  HGB 11.1*  HCT 34.1*  MCV 90.0  PLT 0000000   Basic Metabolic Panel: Recent Labs  Lab 12/04/19 2032  NA 138  K 3.7  CL 96*  CO2 30  GLUCOSE 150*  BUN 15  CREATININE 3.40*  CALCIUM 8.7*   GFR: Estimated Creatinine Clearance: 11.2 mL/min (A) (by C-G formula based on SCr of 3.4 mg/dL (H)). Liver Function Tests: No results for input(s): AST, ALT, ALKPHOS, BILITOT, PROT, ALBUMIN in the last 168 hours. No results for input(s): LIPASE, AMYLASE in the last 168 hours. No results for input(s): AMMONIA in the last 168 hours. Coagulation Profile: No results for input(s): INR, PROTIME in the last 168 hours. Cardiac  Enzymes: No results for input(s): CKTOTAL, CKMB, CKMBINDEX, TROPONINI in the last 168 hours. BNP (last 3 results) No results for input(s): PROBNP in the last 8760 hours. HbA1C: No results for input(s): HGBA1C in the last 72 hours. CBG: No results for input(s): GLUCAP in the last 168 hours. Lipid Profile: No results for input(s): CHOL, HDL, LDLCALC, TRIG, CHOLHDL, LDLDIRECT in the last 72 hours. Thyroid Function Tests: No results for input(s): TSH, T4TOTAL, FREET4, T3FREE, THYROIDAB in the last 72 hours. Anemia Panel: No results for input(s): VITAMINB12, FOLATE, FERRITIN, TIBC, IRON, RETICCTPCT in the last 72 hours. Urine analysis:    Component Value Date/Time   COLORURINE YELLOW (A) 12/04/2019 2054   APPEARANCEUR CLOUDY (A) 12/04/2019 2054   LABSPEC 1.008 12/04/2019 2054   PHURINE 8.0 12/04/2019 2054   GLUCOSEU NEGATIVE 12/04/2019 2054   HGBUR MODERATE (A) 12/04/2019 2054   BILIRUBINUR NEGATIVE 12/04/2019 2054   [place] 12/04/2019 2054   PROTEINUR 100 (A) 12/04/2019 2054   NITRITE NEGATIVE 12/04/2019 2054   LEUKOCYTESUR LARGE (A) 12/04/2019 2054    Radiological Exams on Admission: DG Chest Portable 1 View  Result Date: 12/04/2019 CLINICAL DATA:  Shortness of breath, wheezing, respiratory distress EXAM: PORTABLE CHEST 1 VIEW COMPARISON:  10/23/2019 FINDINGS: Heart is upper limits normal in size. Peribronchial thickening and interstitial prominence. No confluent opacities or effusions. Aortic atherosclerosis. No acute bony abnormality. IMPRESSION: Bronchitic changes. Electronically Signed   By: Rolm Baptise M.D.   On: 12/04/2019 20:43     Assessment/Plan Acute on chronic hypoxic respiratory failure per past documentation- should be on 2-3L. However she reports being discharged home from nursing facility without any O2.  Suspect this exacerbated her underlaying COPD.  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  continue lidoderm patch  PRN low dose morphine for severe pain  asymptomatic bacteruria will not start antibiotics   ESRD on HD tues/thurs/Sat will need to consult nephrology for dialysis   Chronic diastolic heart failure  appears euvolemic on exam continue coreg  CVA continue plavix  Hypertension continue amlodipine, hydralazine, losartan  HLD continue statin   DVT prophylaxis:Heparin SQ Code Status: Full- discussed in detail with patient and she wants CPR and intubation Family Communication: Plan discussed with patient at bedside  disposition Plan: Home with at least 2 midnight stays  Consults called:  Admission status: inpatient   Kazden Largo T Jonny Dearden DO [facility]  If 7PM-7AM, please contact night-coverage [email or web address] Password [email or web address]  12/05/2019, 1:13 AM

## 2019-12-05 NOTE — Progress Notes (Signed)
Established hemodialysis patient known at [facility] ([address] TTS 11:45. Please contact me with any dialysis placement concerns.   Skyelar Swigart Dialysis Coordinator [phone] 

## 2019-12-05 NOTE — ED Notes (Signed)
ED TO INPATIENT HANDOFF REPORT  ED Nurse Name and Phone #: Meredeth Ide. [place] Name/Age/Gender Phyllis [name] 74 y.o. female Room/Bed: ED18A/ED18A  Code Status   Code Status: Full Code  Home/SNF/Other Home Patient oriented to: self, place, time and situation Is this baseline? Yes   Triage Complete: Triage complete  Chief Complaint Acute respiratory failure with hypoxia ([place]) [J96.01]  Triage Note BIB EMS from home for resp distress. Pt recently was in white [employer]. Pt has had 2 cardiac arrest in the past 2 months. CO SOB and wheezing 45 min ago. O2 sat in 80s on RA. ON NRB pt is 92%. Pt currently on C-PAP and 96%. WOB decreased. 20G LAC CBG 176, T 99.42F HX CHF, DM, dialysis pt(unsure of last dialysis date.      Allergies Allergies  Allergen Reactions  . Enalapril Maleate Other (See Comments)    Other reaction(s): Headache  . Nitrofurantoin Swelling and Rash    Other Reaction: swelling of body  . Sulfamethoxazole-Trimethoprim Swelling  . 2,4-D Dimethylamine (Amisol) Rash and Other (See Comments)    Other Reaction: h/a  . Baclofen Other (See Comments) and Nausea Only    lightheadness ,drowsiness , muscle weakness , twitching in hands   . Neosporin [Neomycin-Bacitracin Zn-Polymyx] Other (See Comments) and Rash    Other Reaction: irritation Skin irritation   . Quinine Nausea And Vomiting, Rash and Other (See Comments)    Other Reaction: Vomiting, rash, h/a, vision  . Ultram [Tramadol] Palpitations  . Zocor [Simvastatin] Other (See Comments) and Rash    Other Reaction: muscle spasms Muscle pain and spasms  . Bactrim [Sulfamethoxazole-Trimethoprim] Swelling  . Levodopa Other (See Comments)    Reaction: unknown  . Macrodantin [Nitrofurantoin Macrocrystal] Swelling  . Quinine Derivatives Other (See Comments)    Vertigo,nausea vomiting blurred vision headache ears sensitivity     Level of Care/Admitting Diagnosis ED Disposition    ED Disposition Condition  [facility]: [place] [[id number]]  Level of Care: Med-Surg [16]  Covid Evaluation: Asymptomatic Screening Protocol (No Symptoms)  Diagnosis: Acute respiratory failure with hypoxia [facility]) [id number]  Admitting Physician: Orene Desanctis [id number]  Attending Physician: Orene Desanctis [id number]  Estimated length of stay: past midnight tomorrow  Certification:: I certify this patient will need inpatient services for at least 2 midnights       B Medical/Surgery History Past Medical History:  Diagnosis Date  . (HFpEF) heart failure with preserved ejection fraction ([place])    a. TTE 01/2014: nl LV sys fxn, no valvular abnormalities; b. TTE 11/16: nl EF, mild LVH;  c. 04/2019 Echo: EF 60-65%. DD. Nl RV fxn. Mod AS. 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  . Allergy   . Anemia of chronic disease   . Anxiety   . Aortic atherosclerosis ([place])   . Asthma   . Chronic back pain   . COPD (chronic obstructive pulmonary disease) ([place])   . Diabetes mellitus with complication ([place])   . ESRD on hemodialysis ([place])    a. Tues/Sat; b. 2/2 small kidneys  . Essential hypertension   . Fistula    lower left arm  . GERD (gastroesophageal reflux disease)   . Gout   . History of exercise stress test    a. 01/2014: no evidence of ischemia; b. Lexiscan 08/2015: no sig ischemia, severe GI uptake artifact, low risk; c. CPET @ [facility] 09/2016: exercised 3 min 12 sec on bike without incline,  2.28 METs, VO2 of 8.1, 48% of predicted, indicating mod to sev functional impairment, evidence of blunted HR, stroke volume, and BP augmentation as well as ventilation-perfusion mismatch with exercise  . HLD (hyperlipidemia)   . Non-obstructive Carotid arterial disease ([place])    a. 12/2017: <50% bilat ICA dzs.  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 Severe aortic stenosis    a. by 09/2019 echo b. 04/2019 Echo: Mod AS.  Marland Kitchen Sleep apnea    Past Surgical History:   Procedure Laterality Date  . carpel tunnel    . GALLBLADDER SURGERY    . PERIPHERAL VASCULAR CATHETERIZATION N/A 04/12/2015   Procedure: A/V Shuntogram/Fistulagram;  Surgeon: Algernon Huxley, MD;  Location: [facility];  Service: Cardiovascular;  Laterality: N/A;  . PERIPHERAL VASCULAR CATHETERIZATION N/A 04/12/2015   Procedure: A/V Shunt Intervention;  Surgeon: Algernon Huxley, MD;  Location: [place] [facility] [facility];  Service: Cardiovascular;  Laterality: N/A;  . PERIPHERAL VASCULAR CATHETERIZATION N/A 06/09/2015   Procedure: Dialysis/Perma Catheter Removal;  Surgeon: Katha Cabal, MD;  Location: [facility];  Service: Cardiovascular;  Laterality: N/A;  . RIGHT/LEFT HEART CATH AND CORONARY ANGIOGRAPHY N/A 10/14/2019   Procedure: RIGHT/LEFT HEART CATH AND CORONARY ANGIOGRAPHY;  Surgeon: Belva Crome, MD;  Location: [facility];  Service: Cardiovascular;  Laterality: N/A;     A IV Location/Drains/Wounds Patient Lines/Drains/Airways Status   Active Line/Drains/Airways    Name:   Placement date:   Placement time:   Site:   Days:   Peripheral IV 12/04/19 Right Antecubital   12/04/19    --    Antecubital   1   Fistula / Graft Left Forearm Arteriovenous fistula   --    --    Forearm             Intake/Output Last 24 hours No intake or output data in the 24 hours ending 12/05/19 0006  Labs/Imaging Results for orders placed or performed during the hospital encounter of 12/04/19 (from the past 48 hour(s))  Basic metabolic panel     Status: Abnormal   Collection Time: 12/04/19  8:32 PM  Result Value Ref Range   Sodium 138 135 - 145 mmol/L   Potassium 3.7 3.5 - 5.1 mmol/L   Chloride 96 (L) 98 - 111 mmol/L   CO2 30 22 - 32 mmol/L   Glucose, Bld 150 (H) 70 - 99 mg/dL   BUN 15 8 - 23 mg/dL   Creatinine, Ser 3.40 (H) 0.44 - 1.00 mg/dL   Calcium 8.7 (L) 8.9 - 10.3 mg/dL   GFR calc non Af Amer 13 (L) >60 mL/min   GFR calc Af Amer 15 (L) >60 mL/min   Anion gap 12 5 - 15     Comment: Performed at [facility], [address] [place], [place] [address]  CBC with Differential     Status: Abnormal   Collection Time: 12/04/19  8:32 PM  Result Value Ref Range   WBC 7.5 4.0 - 10.5 K/uL   RBC 3.79 (L) 3.87 - 5.11 MIL/uL   Hemoglobin 11.1 (L) 12.0 - 15.0 g/dL   HCT 34.1 (L) 36.0 - 46.0 %   MCV 90.0 80.0 - 100.0 fL   MCH 29.3 26.0 - 34.0 pg   MCHC 32.6 30.0 - 36.0 g/dL   RDW 13.6 11.5 - 15.5 %   Platelets 175 150 - 400 K/uL   nRBC 0.0 0.0 -  0.2 %   Neutrophils Relative % 82 %   Neutro Abs 6.2 1.7 - 7.7 K/uL   Lymphocytes Relative 7 %   Lymphs Abs 0.5 (L) 0.7 - 4.0 K/uL   Monocytes Relative 7 %   Monocytes Absolute 0.5 0.1 - 1.0 K/uL   Eosinophils Relative 3 %   Eosinophils Absolute 0.2 0.0 - 0.5 K/uL   Basophils Relative 0 %   Basophils Absolute 0.0 0.0 - 0.1 K/uL   Immature Granulocytes 1 %   Abs Immature Granulocytes 0.04 0.00 - 0.07 K/uL    Comment: Performed at [facility], [place], [place] [address]  Troponin I (High Sensitivity)     Status: Abnormal   Collection Time: 12/04/19  8:32 PM  Result Value Ref Range   Troponin I (High Sensitivity) 22 (H) <18 ng/L    Comment: (NOTE) Elevated high sensitivity troponin I (hsTnI) values and significant  changes across serial measurements may suggest ACS but many other  chronic and acute conditions are known to elevate hsTnI results.  Refer to the "Links" section for chest pain algorithms and additional  guidance. Performed at [facility], [address] [place], [place] [address]   Brain natriuretic peptide     Status: Abnormal   Collection Time: 12/04/19  8:32 PM  Result Value Ref Range   B Natriuretic Peptide 1,007.0 (H) 0.0 - 100.0 pg/mL    Comment: Performed at [facility] [place], [address] [place], [place] [address]  Respiratory Panel by RT PCR (Flu A&B, Covid) - Nasopharyngeal Swab     Status: None   Collection Time: 12/04/19  8:33 PM    Specimen: Nasopharyngeal Swab  Result Value Ref Range   SARS Coronavirus 2 by RT PCR NEGATIVE NEGATIVE    Comment: (NOTE) SARS-CoV-2 target nucleic acids are NOT DETECTED. The SARS-CoV-2 RNA is generally detectable in upper respiratoy specimens during the acute phase of infection. The lowest concentration of SARS-CoV-2 viral copies this assay can detect is 131 copies/mL. A negative result does not preclude SARS-Cov-2 infection and should not be used as the sole basis for treatment or other patient management decisions. A negative result may occur with  improper specimen collection/handling, submission of specimen other than nasopharyngeal swab, presence of viral mutation(s) within the areas targeted by this assay, and inadequate number of viral copies (<131 copies/mL). A negative result must be combined with clinical observations, patient history, and epidemiological information. The expected result is Negative. Fact Sheet for Patients:  PinkCheek.be Fact Sheet for Healthcare Providers:  GravelBags.it This test is not yet ap proved or cleared by the [place] FDA and  has been authorized for detection and/or diagnosis of SARS-CoV-2 by FDA under an Emergency Use Authorization (EUA). This EUA will remain  in effect (meaning this test can be used) for the duration of the COVID-19 declaration under Section 564(b)(1) of the Act, 21 U.S.C. section 360bbb-3(b)(1), unless the authorization is terminated or revoked sooner.    Influenza A by PCR NEGATIVE NEGATIVE   Influenza B by PCR NEGATIVE NEGATIVE    Comment: (NOTE) The Xpert Xpress SARS-CoV-2/FLU/RSV assay is intended as an aid in  the diagnosis of influenza from Nasopharyngeal swab specimens and  should not be used as a sole basis for treatment. Nasal washings and  aspirates are unacceptable for Xpert Xpress SARS-CoV-2/FLU/RSV  testing. Fact Sheet for  Patients: PinkCheek.be Fact Sheet for Healthcare Providers: GravelBags.it This test is not yet approved or cleared by the [place]  States FDA and  has been authorized for detection and/or diagnosis of SARS-CoV-2 by  FDA under an Emergency Use Authorization (EUA). This EUA will remain  in effect (meaning this test can be used) for the duration of the  Covid-19 declaration under Section 564(b)(1) of the Act, 21  U.S.C. section 360bbb-3(b)(1), unless the authorization is  terminated or revoked. Performed at [facility], [address] [place], [place] [address]   Blood gas, venous     Status: Abnormal   Collection Time: 12/04/19  8:35 PM  Result Value Ref Range   FIO2 0.45    Delivery systems BILEVEL POSITIVE AIRWAY PRESSURE    Peep/cpap 6.0 cm H20   Pressure support 12 cm H20   pH, Ven 7.43 7.250 - 7.430   pCO2, Ven 49 44.0 - 60.0 mmHg   pO2, Ven 62.0 (H) 32.0 - 45.0 mmHg   Bicarbonate 32.5 (H) 20.0 - 28.0 mmol/L   Acid-Base Excess 6.9 (H) 0.0 - 2.0 mmol/L   O2 Saturation 92.0 %   Collection site VEIN    Sample type VENOUS     Comment: Performed at [facility], [place], [place] [address]  Troponin I (High Sensitivity)     Status: Abnormal   Collection Time: 12/04/19 10:59 PM  Result Value Ref Range   Troponin I (High Sensitivity) 23 (H) <18 ng/L    Comment: (NOTE) Elevated high sensitivity troponin I (hsTnI) values and significant  changes across serial measurements may suggest ACS but many other  chronic and acute conditions are known to elevate hsTnI results.  Refer to the "Links" section for chest pain algorithms and additional  guidance. Performed at [facility], [address] [place], [place] [address]    DG Chest Portable 1 View  Result Date: 12/04/2019 CLINICAL DATA:  Shortness of breath, wheezing, respiratory distress EXAM: PORTABLE CHEST 1 VIEW COMPARISON:   10/23/2019 FINDINGS: Heart is upper limits normal in size. Peribronchial thickening and interstitial prominence. No confluent opacities or effusions. Aortic atherosclerosis. No acute bony abnormality. IMPRESSION: Bronchitic changes. Electronically Signed   By: Rolm Baptise M.D.   On: 12/04/2019 20:43    Pending Labs Unresulted Labs (From admission, onward)    Start     Ordered   12/05/19 [id number]  Basic metabolic panel  Tomorrow morning,   STAT     12/04/19 2247   12/05/19 0500  CBC  Tomorrow morning,   STAT     12/04/19 2247   12/04/19 2106  Urinalysis, Complete w Microscopic  Once,   STAT     12/04/19 2105          Vitals/Pain Today's Vitals   12/04/19 2115 12/04/19 2130 12/04/19 2200 12/05/19 0006  BP: (!) 168/63 (!) 173/63    Pulse: 78 86    Resp: (!) 23 (!) 24    Temp:      TempSrc:      SpO2: 100% 98%    Weight:   59 kg   PainSc:  2   0-No pain    Isolation Precautions No active isolations  Medications Medications  nitroGLYCERIN (NITROSTAT) SL tablet 0.4 mg ( Sublingual Given 12/04/19 2038)  heparin injection 5,000 Units (has no administration in time range)  nitroGLYCERIN (NITROSTAT) 0.4 MG SL tablet (  Given 12/04/19 2023)  methylPREDNISolone sodium succinate (SOLU-MEDROL) 125 mg/2 mL injection 125 mg (125 mg Intravenous Given 12/04/19 2042)  ipratropium-albuterol (DUONEB) 0.5-2.5 (3) MG/3ML nebulizer solution 3 mL (3 mLs  Nebulization Given 12/04/19 2107)  morphine 4 MG/ML injection 4 mg (4 mg Intravenous Given 12/04/19 2244)    Mobility walks Moderate fall risk   Focused Assessments Cardiac Assessment Handoff:  Cardiac Rhythm: Normal sinus rhythm Lab Results  Component Value Date   CKTOTAL 38 11/28/2018   CKMB 1.8 12/04/2014   TROPONINI 0.04 ([place]) 05/05/2019   No results found for: DDIMER Does the Patient currently have chest pain? No  , Pulmonary Assessment Handoff:  Lung sounds: Bilateral Breath Sounds: Coarse crackles, Diminished L Breath Sounds:  Diminished, Rhonchi O2 Device: Bi-PAP        R Recommendations: See Admitting Provider Note  Report given to:   Additional Notes: .

## 2019-12-06 LAB — CBC
HCT: 24.7 % — ABNORMAL LOW (ref 36.0–46.0)
Hemoglobin: 8 g/dL — ABNORMAL LOW (ref 12.0–15.0)
MCH: 29 pg (ref 26.0–34.0)
MCHC: 32.4 g/dL (ref 30.0–36.0)
MCV: 89.5 fL (ref 80.0–100.0)
Platelets: 134 10*3/uL — ABNORMAL LOW (ref 150–400)
RBC: 2.76 MIL/uL — ABNORMAL LOW (ref 3.87–5.11)
RDW: 13.5 % (ref 11.5–15.5)
WBC: 5.8 10*3/uL (ref 4.0–10.5)
nRBC: 0 % (ref 0.0–0.2)

## 2019-12-06 LAB — TROPONIN I (HIGH SENSITIVITY): Troponin I (High Sensitivity): 29 ng/L — ABNORMAL HIGH (ref <18)

## 2019-12-06 LAB — GLUCOSE, CAPILLARY: Glucose-Capillary: 106 mg/dL — ABNORMAL HIGH (ref 70–99)

## 2019-12-06 MED ORDER — PREDNISONE 20 MG PO TABS
30.0000 mg | ORAL_TABLET | Freq: Every day | ORAL | Status: DC
  Administered 2019-12-07: 30 mg via ORAL
  Filled 2019-12-06: qty 1

## 2019-12-06 MED ORDER — IPRATROPIUM-ALBUTEROL 0.5-2.5 (3) MG/3ML IN SOLN
3.0000 mL | Freq: Three times a day (TID) | RESPIRATORY_TRACT | Status: DC
  Administered 2019-12-06 – 2019-12-07 (×4): 3 mL via RESPIRATORY_TRACT
  Filled 2019-12-06 (×4): qty 3

## 2019-12-06 NOTE — Progress Notes (Signed)
This note also relates to the following rows which could not be included: Pulse Rate - Cannot attach notes to unvalidated device data Resp - Cannot attach notes to unvalidated device data BP - Cannot attach notes to unvalidated device data  Hd completed  

## 2019-12-06 NOTE — Progress Notes (Signed)
PT Cancellation Note  Patient Details Name: Phyllis [name] MRN: [id number] DOB: [birth year]   Cancelled Treatment:    Reason Eval/Treat Not Completed: Patient at procedure or test/unavailable.  In HD and will reattempt as time and pt allow.   Ramond Dial 12/06/2019, 12:17 PM   Mee Hives, PT MS Acute Rehab Dept. Number: [place] and [place]

## 2019-12-06 NOTE — Progress Notes (Signed)
[place], [place] 12/06/19  Subjective:   Hospital day # 2  Patient seen during dialysis.  Tolerating well. Denies any acute complaints except midsternal chest pain after CPR No nausea or vomiting Renal: No intake/output data recorded. Lab Results  Component Value Date   CREATININE 4.03 (H) 12/05/2019   CREATININE 3.40 (H) 12/04/2019   CREATININE 5.11 (H) 10/31/2019     Objective:  Vital signs in last 24 hours:  Temp:  [97.8 F (36.6 C)-98.2 F (36.8 C)] 98.1 F (36.7 C) (01/16 0813) Pulse Rate:  [59-75] 67 (01/16 0813) Resp:  [16] 16 (01/15 2311) BP: (132-144)/(44-57) 144/57 (01/16 0813) SpO2:  [100 %] 100 % (01/16 0813)  Weight change:  Filed Weights   12/04/19 2200  Weight: 59 kg    Intake/Output:   No intake or output data in the 24 hours ending 12/06/19 0844   Physical Exam: General:  Chronically ill-appearing, laying in the bed  HEENT  moist oral mucous membranes  Pulm/lungs  mild scattered rhonchi, [place] O2  CVS/Heart  crescendo systolic murmur  Abdomen:   Soft, nontender  Extremities:  No peripheral edema  Neurologic:  Alert, oriented  Skin:  No acute rashes  Access:  Left arm AV fistula       Basic Metabolic Panel:  Recent Labs  Lab 12/04/19 2032 12/05/19 0643  NA 138 139  K 3.7 4.3  CL 96* 99  CO2 30 26  GLUCOSE 150* 166*  BUN 15 21  CREATININE 3.40* 4.03*  CALCIUM 8.7* 8.7*     CBC: Recent Labs  Lab 12/04/19 2032 12/05/19 0643 12/05/19 2006 12/06/19 0549  WBC 7.5 4.7 4.8 5.8  NEUTROABS 6.2  --   --   --   HGB 11.1* 10.1* 8.6* 8.0*  HCT 34.1* 30.9* 27.0* 24.7*  MCV 90.0 88.3 90.6 89.5  PLT 175 142* 126* 134*      Lab Results  Component Value Date   HEPBSAG NON REACTIVE 10/16/2019   HEPBSAB Reactive (A) 10/16/2019      Microbiology:  Recent Results (from the past 240 hour(s))  Respiratory Panel by RT PCR (Flu A&B, Covid) - Nasopharyngeal Swab     Status: None   Collection Time:  12/04/19  8:33 PM   Specimen: Nasopharyngeal Swab  Result Value Ref Range Status   SARS Coronavirus 2 by RT PCR NEGATIVE NEGATIVE Final    Comment: (NOTE) SARS-CoV-2 target nucleic acids are NOT DETECTED. The SARS-CoV-2 RNA is generally detectable in upper respiratoy specimens during the acute phase of infection. The lowest concentration of SARS-CoV-2 viral copies this assay can detect is 131 copies/mL. A negative result does not preclude SARS-Cov-2 infection and should not be used as the sole basis for treatment or other patient management decisions. A negative result may occur with  improper specimen collection/handling, submission of specimen other than nasopharyngeal swab, presence of viral mutation(s) within the areas targeted by this assay, and inadequate number of viral copies (<131 copies/mL). A negative result must be combined with clinical observations, patient history, and epidemiological information. The expected result is Negative. Fact Sheet for Patients:  PinkCheek.be Fact Sheet for Healthcare Providers:  GravelBags.it This test is not yet ap proved or cleared by the [place] FDA and  has been authorized for detection and/or diagnosis of SARS-CoV-2 by FDA under an Emergency Use Authorization (EUA). This EUA will remain  in effect (meaning this test can be used) for the duration of the COVID-19 declaration under Section 564(b)(1)  of the Act, 21 U.S.C. section 360bbb-3(b)(1), unless the authorization is terminated or revoked sooner.    Influenza A by PCR NEGATIVE NEGATIVE Final   Influenza B by PCR NEGATIVE NEGATIVE Final    Comment: (NOTE) The Xpert Xpress SARS-CoV-2/FLU/RSV assay is intended as an aid in  the diagnosis of influenza from Nasopharyngeal swab specimens and  should not be used as a sole basis for treatment. Nasal washings and  aspirates are unacceptable for Xpert Xpress SARS-CoV-2/FLU/RSV   testing. Fact Sheet for Patients: PinkCheek.be Fact Sheet for Healthcare Providers: GravelBags.it This test is not yet approved or cleared by the [place] FDA and  has been authorized for detection and/or diagnosis of SARS-CoV-2 by  FDA under an Emergency Use Authorization (EUA). This EUA will remain  in effect (meaning this test can be used) for the duration of the  Covid-19 declaration under Section 564(b)(1) of the Act, 21  U.S.C. section 360bbb-3(b)(1), unless the authorization is  terminated or revoked. Performed at [facility], [address] [place], [place] [address]     Coagulation Studies: No results for input(s): LABPROT, INR in the last 72 hours.  Urinalysis: Recent Labs    12/04/19 2054  COLORURINE YELLOW*  LABSPEC 1.008  PHURINE 8.0  GLUCOSEU NEGATIVE  HGBUR MODERATE*  BILIRUBINUR NEGATIVE  KETONESUR NEGATIVE  PROTEINUR 100*  NITRITE NEGATIVE  LEUKOCYTESUR LARGE*      Imaging: DG Chest Portable 1 View  Result Date: 12/04/2019 CLINICAL DATA:  Shortness of breath, wheezing, respiratory distress EXAM: PORTABLE CHEST 1 VIEW COMPARISON:  10/23/2019 FINDINGS: Heart is upper limits normal in size. Peribronchial thickening and interstitial prominence. No confluent opacities or effusions. Aortic atherosclerosis. No acute bony abnormality. IMPRESSION: Bronchitic changes. Electronically Signed   By: Rolm Baptise M.D.   On: 12/04/2019 20:43     Medications:    . amLODipine  10 mg Oral Daily  . carvedilol  12.5 mg Oral BID WC  . clopidogrel  75 mg Oral Daily  . epoetin (EPOGEN/PROCRIT) injection  10,000 Units Intravenous Q T,Th,Sa-HD  . heparin injection (subcutaneous)  5,000 Units Subcutaneous Q8H  . hydrALAZINE  50 mg Oral Q8H  . ipratropium-albuterol  3 mL Nebulization TID  . lidocaine  1 patch Transdermal Q24H  . losartan  50 mg Oral Daily  . methylPREDNISolone (SOLU-MEDROL)  injection  40 mg Intravenous Daily  . oxybutynin  15 mg Oral Daily  . pantoprazole  40 mg Oral Daily  . pravastatin  40 mg Oral QHS   acetaminophen, ALPRAZolam, HYDROcodone-acetaminophen, morphine injection, ondansetron (ZOFRAN) IV  Assessment/ Plan:  74 y.o. female with  end stage renal disease on hemodialysis, aortic stenosis, hypertension, chronic costochondritis, COPD, diabetes mellitus type II, overactive bladder, gout, GERD  admitted on 12/04/2019 for COPD exacerbation ([place]) [J44.1] Acute respiratory failure with hypoxia ([place]) [J96.01] ESRD on hemodialysis ([place]) [N18.6, Z99.2]  [facility] TTS [address] Left AVF 58kg   #End-stage renal disease Seen during dialysis.  Tolerating well Ultrafiltration goal up to 2.5 L as tolerated  #Anemia of chronic kidney disease Epogen 10,000 unit IV Tuesday, Thursday, Saturday schedule with dialysis  #Secondary hyperparathyroidism Monitor calcium and phosphorus during admission    LOS: 2 Maille Halli[place] Candiss Norse 1/16/20218:44 [place], [place]  Note: This note was prepared with Dragon dictation. Any transcription errors are unintentional

## 2019-12-06 NOTE — Progress Notes (Signed)
PROGRESS NOTE    Phyllis [name]  [id number] DOB: 05-Dec-1945 DOA: 12/04/2019  PCP: [name], MD    LOS - 2   Brief Narrative:  Phyllis [name] is a 74 y.o. female with medical history significant of ESRD on HD tues/thurs/Sat, COPD, CVA, Chronic diastolic heart failure, severe aortic stenosis, prone to flash pulmonary edema, hx of PEA arrest in 09/2019 who presents with chest pain and shortness of breath.  Returned home from SNF day before, was in rehab after early December admission for acute hypoxic respiratory failure secondary to combined pulmonary edema, exacerbation of COPD, ESRD.  Patient has ongoing chest pain since having CPR, has lidocaine patch, but chest pain worsened after dialysis day of admission and breathing worsened.  In the ED, hypertensive 180/80, afebrile, hypoxic initially requiring BiPAP but weaned to 5 L/min nasal cannula.  No leukocytosis, stable anemia BNP 1007, troponins 22, 23   Subjective: Feels fine when she is receiving oxygen.  She is afraid that if I will drop her oxygen down to 1 L she was started having shortness of breath.  No nausea no vomiting no fever no chills.  Reports some dry cough.  No chest pain or chest tightness right now but she mentions that when she wore CPAP last night she had some chest pain.  Assessment & Plan:   Principal Problem:   Acute respiratory failure (HCC) Active Problems:   ESRD (end stage renal disease) on dialysis ([place])   Hyperlipidemia   CVA (cerebral vascular accident) ([place])   Chronic diastolic heart failure (HCC)   HTN (hypertension)   Asymptomatic bacteriuria   Acute on chronic respiratory failure with hypoxia Likely from volume overload Likely multifactorial, given her ongoing costochondritis and pain with inspiration, and chest x-ray showing low lung volumes, suspect patient not inhaling very well.  Also COPD and ESRD and aortic stenosis contributory. Per chart review, should be on 2 3 L/min,  discharged home from SNF without oxygen. --Supplemental oxygen, maintain O2 sat > 90% --Continue steroids and duo nebs  Chest pain  -costochondritis secondary to recent CPR Troponins mildly elevated but flat chest pain not ischemic in nature, suspect demand ischemia versus ESRD. -Continue lidocaine patch -Low-dose morphine as needed for severe pain  Asymptomatic bacteriuria  None-avoid antibiotics  ESRD  -on HD Tuesday/Thursday/Saturday -Nephrology consulted for dialysis   Chronic diastolic CHF -clinically euvolemic -Continue Coreg  History of CVA  -continue Plavix  Essential hypertension -chronic -Continue home amlodipine, hydralazine, losartan  Hyperlipidemia -Continue home statin  OSA. Patient is supposed to be on BiPAP but has never used BiPAP for last many years. Discontinue BiPAP as needed order. Use CPAP nightly  DVT prophylaxis: Heparin   Code Status: Full Code  Family Communication: None at bedside Disposition Plan: Hopefully discharge home since she has just completed short-term rehab stay.  Pending clinical improvement and PT eval, likely tomorrow on 12/07/2019   Consultants:   Nephrology  Procedures:   HD  Antimicrobials:   None   Objective: Vitals:   12/06/19 1334 12/06/19 1345 12/06/19 1400 12/06/19 1434  BP: (!) 152/43 (!) 145/49 (!) 159/56 (!) 139/46  Pulse: 68 61 70 67  Resp: 13 14 12 16   Temp:   98.3 F (36.8 C) 98.4 F (36.9 C)  TempSrc:   Oral Oral  SpO2:    100%  Weight:        Intake/Output Summary (Last 24 hours) at 12/06/2019 2009 Last data filed at 12/06/2019 1900 Gross per 24 hour  Intake 480 ml  Output 2227 ml  Net -1747 ml   Filed Weights   12/04/19 2200  Weight: 59 kg    Examination:  General exam: awake, alert, no acute distress, obese HEENT: moist mucus membranes, hearing grossly normal  Respiratory system: Diminished bilaterally, no wheezes, rales or rhonchi, normal respiratory effort. Cardiovascular  system: normal S1/S2, RRR, no JVD, murmurs, rubs, gallops, no pedal edema.   Central nervous system: alert and oriented x4. no gross focal neurologic deficits, normal speech Extremities: moves all, no edema, normal tone Skin: dry, intact, normal temperature Psychiatry: normal mood, congruent affect, judgement and insight appear normal    Data Reviewed: I have personally reviewed following labs and imaging studies  CBC: Recent Labs  Lab 12/04/19 2032 12/05/19 0643 12/05/19 2006 12/06/19 0549  WBC 7.5 4.7 4.8 5.8  NEUTROABS 6.2  --   --   --   HGB 11.1* 10.1* 8.6* 8.0*  HCT 34.1* 30.9* 27.0* 24.7*  MCV 90.0 88.3 90.6 89.5  PLT 175 142* 126* [id number]*   Basic Metabolic Panel: Recent Labs  Lab 12/04/19 2032 12/05/19 0643  NA 138 139  K 3.7 4.3  CL 96* 99  CO2 30 26  GLUCOSE 150* 166*  BUN 15 21  CREATININE 3.40* 4.03*  CALCIUM 8.7* 8.7*   GFR: Estimated Creatinine Clearance: 9.4 mL/min (A) (by C-G formula based on SCr of 4.03 mg/dL (H)). Liver Function Tests: No results for input(s): AST, ALT, ALKPHOS, BILITOT, PROT, ALBUMIN in the last 168 hours. No results for input(s): LIPASE, AMYLASE in the last 168 hours. No results for input(s): AMMONIA in the last 168 hours. Coagulation Profile: No results for input(s): INR, PROTIME in the last 168 hours. Cardiac Enzymes: No results for input(s): CKTOTAL, CKMB, CKMBINDEX, TROPONINI in the last 168 hours. BNP (last 3 results) No results for input(s): PROBNP in the last 8760 hours. HbA1C: No results for input(s): HGBA1C in the last 72 hours. CBG: Recent Labs  Lab 12/06/19 1202  GLUCAP 106*   Lipid Profile: No results for input(s): CHOL, HDL, LDLCALC, TRIG, CHOLHDL, LDLDIRECT in the last 72 hours. Thyroid Function Tests: No results for input(s): TSH, T4TOTAL, FREET4, T3FREE, THYROIDAB in the last 72 hours. Anemia Panel: No results for input(s): VITAMINB12, FOLATE, FERRITIN, TIBC, IRON, RETICCTPCT in the last 72 hours. Sepsis  Labs: No results for input(s): PROCALCITON, LATICACIDVEN in the last 168 hours.  Recent Results (from the past 240 hour(s))  Respiratory Panel by RT PCR (Flu A&B, Covid) - Nasopharyngeal Swab     Status: None   Collection Time: 12/04/19  8:33 PM   Specimen: Nasopharyngeal Swab  Result Value Ref Range Status   SARS Coronavirus 2 by RT PCR NEGATIVE NEGATIVE Final    Comment: (NOTE) SARS-CoV-2 target nucleic acids are NOT DETECTED. The SARS-CoV-2 RNA is generally detectable in upper respiratoy specimens during the acute phase of infection. The lowest concentration of SARS-CoV-2 viral copies this assay can detect is 131 copies/mL. A negative result does not preclude SARS-Cov-2 infection and should not be used as the sole basis for treatment or other patient management decisions. A negative result may occur with  improper specimen collection/handling, submission of specimen other than nasopharyngeal swab, presence of viral mutation(s) within the areas targeted by this assay, and inadequate number of viral copies (<131 copies/mL). A negative result must be combined with clinical observations, patient history, and epidemiological information. The expected result is Negative. Fact Sheet for Patients:  PinkCheek.be Fact Sheet for Healthcare Providers:  GravelBags.it This test is not yet ap proved or cleared by the [place] and  has been authorized for detection and/or diagnosis of SARS-CoV-2 by FDA under an Emergency Use Authorization (EUA). This EUA will remain  in effect (meaning this test can be used) for the duration of the COVID-19 declaration under Section 564(b)(1) of the Act, 21 U.S.C. section 360bbb-3(b)(1), unless the authorization is terminated or revoked sooner.    Influenza A by PCR NEGATIVE NEGATIVE Final   Influenza B by PCR NEGATIVE NEGATIVE Final    Comment: (NOTE) The Xpert Xpress SARS-CoV-2/FLU/RSV assay  is intended as an aid in  the diagnosis of influenza from Nasopharyngeal swab specimens and  should not be used as a sole basis for treatment. Nasal washings and  aspirates are unacceptable for Xpert Xpress SARS-CoV-2/FLU/RSV  testing. Fact Sheet for Patients: PinkCheek.be Fact Sheet for Healthcare Providers: GravelBags.it This test is not yet approved or cleared by the [place] FDA and  has been authorized for detection and/or diagnosis of SARS-CoV-2 by  FDA under an Emergency Use Authorization (EUA). This EUA will remain  in effect (meaning this test can be used) for the duration of the  Covid-19 declaration under Section 564(b)(1) of the Act, 21  U.S.C. section 360bbb-3(b)(1), unless the authorization is  terminated or revoked. Performed at [facility], [address] [place], [place] [address]          Radiology Studies: DG Chest Portable 1 View  Result Date: 12/04/2019 CLINICAL DATA:  Shortness of breath, wheezing, respiratory distress EXAM: PORTABLE CHEST 1 VIEW COMPARISON:  10/23/2019 FINDINGS: Heart is upper limits normal in size. Peribronchial thickening and interstitial prominence. No confluent opacities or effusions. Aortic atherosclerosis. No acute bony abnormality. IMPRESSION: Bronchitic changes. Electronically Signed   By: Rolm Baptise M.D.   On: 12/04/2019 20:43        Scheduled Meds: . amLODipine  10 mg Oral Daily  . carvedilol  12.5 mg Oral BID WC  . clopidogrel  75 mg Oral Daily  . epoetin (EPOGEN/PROCRIT) injection  10,000 Units Intravenous Q T,Th,Sa-HD  . heparin injection (subcutaneous)  5,000 Units Subcutaneous Q8H  . hydrALAZINE  50 mg Oral Q8H  . ipratropium-albuterol  3 mL Nebulization TID  . lidocaine  1 patch Transdermal Q24H  . losartan  50 mg Oral Daily  . methylPREDNISolone (SOLU-MEDROL) injection  40 mg Intravenous Daily  . oxybutynin  15 mg Oral Daily  .  pantoprazole  40 mg Oral Daily  . pravastatin  40 mg Oral QHS   Continuous Infusions:   LOS: 2 days    Time spent: 35 minutes    Berle Mull, [facility]   If 7PM-7AM, please contact night-coverage  12/06/2019, 8:09 PM

## 2019-12-06 NOTE — Progress Notes (Signed)
This note also relates to the following rows which could not be included: Pulse Rate - Cannot attach notes to unvalidated device data Resp - Cannot attach notes to unvalidated device data BP - Cannot attach notes to unvalidated device data SpO2 - Cannot attach notes to unvalidated device data  Hd started  

## 2019-12-06 NOTE — Progress Notes (Signed)
Patient place on Bipap QHS 12cmH2O/6cmH2O per MD order. Patient tolerated Bipap well for approximately 15 minutes before requesting RN take it off. Patient placed back on [place]. Will continue to monitor.

## 2019-12-07 LAB — RENAL FUNCTION PANEL
Albumin: 3.6 g/dL (ref 3.5–5.0)
Anion gap: 12 (ref 5–15)
BUN: 24 mg/dL — ABNORMAL HIGH (ref 8–23)
CO2: 27 mmol/L (ref 22–32)
Calcium: 7.9 mg/dL — ABNORMAL LOW (ref 8.9–10.3)
Chloride: 98 mmol/L (ref 98–111)
Creatinine, Ser: 3.81 mg/dL — ABNORMAL HIGH (ref 0.44–1.00)
GFR calc Af Amer: 13 mL/min — ABNORMAL LOW (ref >60)
GFR calc non Af Amer: 11 mL/min — ABNORMAL LOW (ref >60)
Glucose, Bld: 169 mg/dL — ABNORMAL HIGH (ref 70–99)
Phosphorus: 3 mg/dL (ref 2.5–4.6)
Potassium: 3.7 mmol/L (ref 3.5–5.1)
Sodium: 137 mmol/L (ref 135–145)

## 2019-12-07 LAB — CBC
HCT: 29.3 % — ABNORMAL LOW (ref 36.0–46.0)
Hemoglobin: 9.1 g/dL — ABNORMAL LOW (ref 12.0–15.0)
MCH: 29 pg (ref 26.0–34.0)
MCHC: 31.1 g/dL (ref 30.0–36.0)
MCV: 93.3 fL (ref 80.0–100.0)
Platelets: 125 10*3/uL — ABNORMAL LOW (ref 150–400)
RBC: 3.14 MIL/uL — ABNORMAL LOW (ref 3.87–5.11)
RDW: 13.4 % (ref 11.5–15.5)
WBC: 6.8 10*3/uL (ref 4.0–10.5)
nRBC: 0 % (ref 0.0–0.2)

## 2019-12-07 MED ORDER — IPRATROPIUM-ALBUTEROL 0.5-2.5 (3) MG/3ML IN SOLN: 3.0000 mL | Freq: Two times a day (BID) | RESPIRATORY_TRACT | Status: DC

## 2019-12-07 NOTE — Evaluation (Signed)
Physical Therapy Evaluation Patient Details Name: Phyllis [name] MRN: [id number] DOB: [birth year] Today's Date: 12/07/2019   History of Present Illness  74 yo female admitted for acute respiratory failure with admit in Oct 2020 for same.  Was discharged from [facility] SNF on 1/15 and admitted to [facility] next day.  Had chest pain, SOB and has been concerned she needs O2.  Pain related to prev CPR on past admit, had acute resp failure with O2 applied, now on room air.  PMHx: intubation, HTN, CHF, resp failure, CVA, HD, COPD, aortic stenosis, pulm edema, OSA  Clinical Impression  Pt was seen for mobility and noted her O2 sat on room air maintained at 97% or better including on stairs.  Pt is concerned about having no O2 to take home but assured her PT would see her and could monitor her sats during sessions to see what her needs are.  Follow acutely for these issues, focusing on tolerance of gait and balance skills with pediatric walker and monitor O2 sats, pulses and BP as needed.    Follow Up Recommendations Home health PT;Supervision for mobility/OOB    Equipment Recommendations  None recommended by PT    Recommendations for Other Services       Precautions / Restrictions Precautions Precautions: Fall Precaution Comments: on O2, monitor vitals Restrictions Weight Bearing Restrictions: No  Using a pediatric height walker     Mobility  Bed Mobility Overal bed mobility: Needs Assistance Bed Mobility: Supine to Sit;Sit to Supine     Supine to sit: Min guard;Min assist Sit to supine: Min guard   General bed mobility comments: min assist to support the lift of trunk off bed  Transfers Overall transfer level: Needs assistance Equipment used: Rolling walker (2 wheeled);1 person hand held assist Transfers: Sit to/from Stand Sit to Stand: Min assist         General transfer comment: min assist to power up then min guard to steady  Ambulation/Gait Ambulation/Gait assistance: Min  guard Gait Distance (Feet): 30 Feet(15 x 2) Assistive device: Rolling walker (2 wheeled);1 person hand held assist Gait Pattern/deviations: Step-through pattern;Wide base of support;Decreased stride length Gait velocity: reduced Gait velocity interpretation: <1.31 ft/sec, indicative of household ambulator General Gait Details: reminders to use walker standing inside but is able to understand and correct herself  Stairs Stairs: Yes Stairs assistance: Min guard Stair Management: One rail Left;Step to pattern Number of Stairs: 4 General stair comments: pt performed similar steps to home entrance  Wheelchair Mobility    Modified Rankin (Stroke Patients Only)       Balance Overall balance assessment: Needs assistance Sitting-balance support: Feet supported Sitting balance-Leahy Scale: Fair     Standing balance support: Bilateral upper extremity supported;During functional activity Standing balance-Leahy Scale: Poor Standing balance comment: poor but understands purpose of walker and uses hand rails on chair to stand to transfer to stair rail                             Pertinent Vitals/Pain Pain Assessment: No/denies pain    Home Living Family/patient expects to be discharged to:: Private residence Living Arrangements: Other (Comment)(caregiver 24/7) Available Help at Discharge: Friend(s);Available 24 hours/day;Personal care attendant Type of Home: House Home Access: Stairs to enter Entrance Stairs-Rails: Left Entrance Stairs-Number of Steps: 4 Home Layout: Multi-level;Able to live on main level with bedroom/bathroom Home Equipment: Gilford Rile - 2 wheels;Shower seat 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    Prior  Function Level of Independence: Independent with assistive device(s);Needs assistance   Gait / Transfers Assistance Needed: walking with RW without help per pt  ADL's / Homemaking Assistance Needed: has a caregiver to assist with her bathing and  housework        Hand Dominance   Dominant Hand: Right    Extremity/Trunk Assessment   Upper Extremity Assessment Upper Extremity Assessment: Generalized weakness    Lower Extremity Assessment Lower Extremity Assessment: Generalized weakness    Cervical / Trunk Assessment Cervical / Trunk Assessment: Kyphotic  Communication   Communication: No difficulties  Cognition Arousal/Alertness: Awake/alert Behavior During Therapy: WFL for tasks assessed/performed Overall Cognitive Status: Within Functional Limits for tasks assessed                                        General Comments General comments (skin integrity, edema, etc.): pt is maintaining O2 sats at 97-100% with room air and walking as well as climbing stairs, no a-fib with this activity noted    Exercises     Assessment/Plan    PT Assessment Patient needs continued PT services  PT Problem List Decreased strength;Decreased range of motion;Decreased activity tolerance;Decreased balance;Decreased mobility;Decreased coordination;Decreased knowledge of use of DME;Decreased safety awareness;Cardiopulmonary status limiting activity       PT Treatment Interventions DME instruction;Gait training;Stair training;Functional mobility training;Therapeutic activities;Therapeutic exercise;Balance training;Neuromuscular re-education;Patient/family education    PT Goals (Current goals can be found in the Care Plan section)  Acute Rehab PT Goals Patient Stated Goal: to get home and get stronger PT Goal Formulation: With patient Time For Goal Achievement: 12/21/19 Potential to Achieve Goals: Good    Frequency Min 2X/week   Barriers to discharge Decreased caregiver support home with one caregiver    Co-evaluation               AM-PAC PT "6 Clicks" Mobility  Outcome Measure Help needed turning from your back to your side while in a flat bed without using bedrails?: None Help needed moving from lying  on your back to sitting on the side of a flat bed without using bedrails?: A Little Help needed moving to and from a bed to a chair (including a wheelchair)?: A Little Help needed standing up from a chair using your arms (e.g., wheelchair or bedside chair)?: A Little Help needed to walk in hospital room?: A Little Help needed climbing 3-5 steps with a railing? : A Little 6 Click Score: 19    End of Session Equipment Utilized During Treatment: Gait belt;Oxygen(disposable for home) Activity Tolerance: Patient tolerated treatment well;Other (comment)(fearful of not taking O2 home) Patient left: in bed;with call bell/phone within reach;with bed alarm set Nurse Communication: Mobility status PT Visit Diagnosis: Unsteadiness on feet (R26.81);Muscle weakness (generalized) (M62.81);Difficulty in walking, not elsewhere classified (R26.2)    Time: [id number] PT Time Calculation (min) (ACUTE ONLY): 45 min   Charges:   PT Evaluation $PT Eval Moderate Complexity: 1 Mod PT Treatments $Gait Training: 8-22 mins $Therapeutic Exercise: 8-22 mins       Ramond Dial 12/07/2019, 12:39 PM   Mee Hives, PT MS Acute Rehab Dept. Number: [place] and [place]

## 2019-12-07 NOTE — Discharge Summary (Signed)
Physician Discharge Summary   Phyllis [name]  female DOB: [birth year]  [id number]  PCP: [name], MD  Admit date: 12/04/2019 Discharge date: 12/07/2019  Admitted From: home Disposition:  home Home Health: Yes CODE STATUS: Full code  Discharge Instructions    Diet - low sodium heart healthy   Complete by: As directed    Discharge instructions   Complete by: As directed    Please follow up with your outpatient doctor to see if you need a sleepy study to qualify for CPAP nightly. - -   Increase activity slowly   Complete by: As directed        Hospital Course:  For full details, please see H&P, progress notes, consult notes and ancillary notes.  Briefly,  Phyllis Igo Crutchfieldis a 74 y.o.Caucasian femalewith medical history significant ofESRD on HD tues/thurs/Sat, COPD, CVA, Chronic diastolic heart failure, severe aortic stenosis, prone to flash pulmonary edema, hx of PEA arrest in 09/2019 who presented with chest pain and shortness of breath.    Pt had returned home from SNF day before, was in rehab after early December admission for acute hypoxic respiratory failure secondary to combined pulmonary edema, exacerbation of COPD, ESRD.  Patient has ongoing chest pain since she had CPR, has lidocaine patch, but chest pain worsened after dialysis day of admission and breathing worsened.  In the ED, hypertensive 180/80, afebrile, hypoxic initially requiring BiPAP but weaned to 5 L/min nasal cannula.  No leukocytosis, stable anemia, BNP 1007, troponins 22, 23.  Acute on chronic respiratory failure with hypoxia Likely multifactorial.  Given her ongoing costochondritis and pain with inspiration, and chest x-ray showing low lung volumes, suspect patient not inhaling very well.  Also COPD and ESRD and aortic stenosis contributory. Pt received scheduled DuoNebs, steroids, and Norco for chest pain.  Also iHD for volume removal.  Prior to discharge, pt's "O2 sat on room air  maintained at 97% or better including on stairs", per PT.    Chest pain 2/2 costochondritis  Patient has ongoing chest pain since she had CPR in 09/2019.  Trop in 20's and flat, considered not significant. Pt was continued on lidocaine patch.  Also received PRN Norco while inpatient.  Asymptomatic bacteriuria  Antibiotics not started.  ESRD on HD TTS Nephrology consulted for dialysis   Chronic diastolic CHF, stable Clinically euvolemic.  Continued Coreg  History of CVA  Continued Plavix  Essential hypertension, chronic Continued home amlodipine, hydralazine, losartan  Hyperlipidemia Continued home statin  OSA Patient is supposed to be on BiPAP but has not used BiPAP for last many years.  Pt is advised to follow up with PCP to obtain CPAP.    Discharge Diagnoses:  Principal Problem:   Acute respiratory failure ([place]) Active Problems:   ESRD (end stage renal disease) on dialysis ([name])   Hyperlipidemia   CVA (cerebral vascular accident) ([name])   Chronic diastolic heart failure (HCC)   HTN (hypertension)   Asymptomatic bacteriuria    Discharge Instructions:  Allergies as of 12/07/2019      Reactions   Enalapril Maleate Other (See Comments)   Other reaction(s): Headache   Nitrofurantoin Swelling, Rash   Other Reaction: swelling of body   Sulfamethoxazole-trimethoprim Swelling   2,4-d Dimethylamine (amisol) Rash, Other (See Comments)   Other Reaction: h/a   Baclofen Other (See Comments), Nausea Only   lightheadness ,drowsiness , muscle weakness , twitching in hands    Neosporin [neomycin-bacitracin Zn-polymyx] Other (See Comments), Rash   Other Reaction: irritation  Skin irritation   Quinine Nausea And Vomiting, Rash, Other (See Comments)   Other Reaction: Vomiting, rash, h/a, vision   Ultram [tramadol] Palpitations   Zocor [simvastatin] Other (See Comments), Rash   Other Reaction: muscle spasms Muscle pain and spasms   Bactrim  [sulfamethoxazole-trimethoprim] Swelling   Levodopa Other (See Comments)   Reaction: unknown   Macrodantin [nitrofurantoin Macrocrystal] Swelling   Quinine Derivatives Other (See Comments)   Vertigo,nausea vomiting blurred vision headache ears sensitivity      Medication List    STOP taking these medications   Fluticasone-Salmeterol 250-50 MCG/DOSE Aepb Commonly known as: ADVAIR   torsemide 20 MG tablet Commonly known as: DEMADEX     TAKE these medications   acetaminophen 500 MG tablet Commonly known as: TYLENOL Take 1 tablet (500 mg total) by mouth every 6 (six) hours as needed for mild pain or fever.   albuterol 108 (90 Base) MCG/ACT inhaler Commonly known as: VENTOLIN HFA Inhale 2 puffs into the lungs every 6 (six) hours as needed for wheezing or shortness of breath.   ALPRAZolam 0.25 MG tablet Commonly known as: XANAX Take 0.25 mg by mouth every 6 (six) hours as needed for anxiety.   amLODipine 10 MG tablet Commonly known as: NORVASC Take 10 mg by mouth daily.   benzonatate 100 MG capsule Commonly known as: TESSALON Take 1 capsule (100 mg total) by mouth 2 (two) times daily as needed for cough.   calcium acetate 667 MG capsule Commonly known as: PHOSLO Take 1,334 mg by mouth 3 (three) times daily with meals.   carvedilol 12.5 MG tablet Commonly known as: COREG Take 12.5 mg by mouth 2 (two) times daily with a meal.   cetirizine 10 MG tablet Commonly known as: ZYRTEC Take 10 mg by mouth daily as needed for allergies.   clopidogrel 75 MG tablet Commonly known as: PLAVIX Take 1 tablet (75 mg total) by mouth daily.   hydrALAZINE 50 MG tablet Commonly known as: APRESOLINE Take 1 tablet (50 mg total) by mouth every 8 (eight) hours.   HYDROcodone-acetaminophen 7.5-325 MG tablet Commonly known as: NORCO Take 1 tablet by mouth every 6 (six) hours as needed (pain).   ipratropium-albuterol 0.5-2.5 (3) MG/3ML Soln Commonly known as: DUONEB Take 3 mLs by  nebulization every 6 (six) hours as needed.   lidocaine 5 % Commonly known as: LIDODERM Place 1 patch onto the skin daily. Remove & Discard patch within 12 hours or as directed by MD   lidocaine-prilocaine cream Commonly known as: EMLA Apply 1 application topically as needed (prior to treatment).   losartan 50 MG tablet Commonly known as: COZAAR Take 50 mg by mouth daily.   ondansetron 4 MG tablet Commonly known as: ZOFRAN Take 4 mg by mouth every 8 (eight) hours as needed.   oxybutynin 15 MG 24 hr tablet Commonly known as: DITROPAN XL Take 15 mg by mouth daily.   pantoprazole 40 MG tablet Commonly known as: Protonix Take 1 tablet (40 mg total) by mouth daily.   pravastatin 40 MG tablet Commonly known as: PRAVACHOL Take 40 mg by mouth at bedtime.   scopolamine 1 MG/3DAYS Commonly known as: TRANSDERM-SCOP Place 1 patch onto the skin every 3 (three) days.   topiramate 25 MG tablet Commonly known as: TOPAMAX Take 1 tablet (25 mg total) by mouth as needed (for headaches).       Follow-up Information    [name], MD. Schedule an appointment as soon as possible for a visit in  1 week(s).   Specialty: Internal Medicine Contact information: [place] [address] [phone]        Minna Merritts, MD .   Specialty: Cardiology Contact information: [address] [place] [address] [phone]           Allergies  Allergen Reactions  . Enalapril Maleate Other (See Comments)    Other reaction(s): Headache  . Nitrofurantoin Swelling and Rash    Other Reaction: swelling of body  . Sulfamethoxazole-Trimethoprim Swelling  . 2,4-D Dimethylamine (Amisol) Rash and Other (See Comments)    Other Reaction: h/a  . Baclofen Other (See Comments) and Nausea Only    lightheadness ,drowsiness , muscle weakness , twitching in hands   . Neosporin [Neomycin-Bacitracin Zn-Polymyx] Other (See Comments)  and Rash    Other Reaction: irritation Skin irritation   . Quinine Nausea And Vomiting, Rash and Other (See Comments)    Other Reaction: Vomiting, rash, h/a, vision  . Ultram [Tramadol] Palpitations  . Zocor [Simvastatin] Other (See Comments) and Rash    Other Reaction: muscle spasms Muscle pain and spasms  . Bactrim [Sulfamethoxazole-Trimethoprim] Swelling  . Levodopa Other (See Comments)    Reaction: unknown  . Macrodantin [Nitrofurantoin Macrocrystal] Swelling  . Quinine Derivatives Other (See Comments)    Vertigo,nausea vomiting blurred vision headache ears sensitivity      The results of significant diagnostics from this hospitalization (including imaging, microbiology, ancillary and laboratory) are listed below for reference.   Consultations:   Procedures/Studies: DG Chest Portable 1 View  Result Date: 12/04/2019 CLINICAL DATA:  Shortness of breath, wheezing, respiratory distress EXAM: PORTABLE CHEST 1 VIEW COMPARISON:  10/23/2019 FINDINGS: Heart is upper limits normal in size. Peribronchial thickening and interstitial prominence. No confluent opacities or effusions. Aortic atherosclerosis. No acute bony abnormality. IMPRESSION: Bronchitic changes. Electronically Signed   By: Rolm Baptise M.D.   On: 12/04/2019 20:43      Labs: BNP (last 3 results) Recent Labs    10/11/19 0626 10/23/19 0544 12/04/19 2032  BNP 2,087.0* 1,573.0* Q000111Q*   Basic Metabolic Panel: Recent Labs  Lab 12/04/19 2032 12/05/19 0643 12/07/19 0552  NA 138 139 137  K 3.7 4.3 3.7  CL 96* 99 98  CO2 30 26 27   GLUCOSE 150* 166* 169*  BUN 15 21 24*  CREATININE 3.40* 4.03* 3.81*  CALCIUM 8.7* 8.7* 7.9*  PHOS  --   --  3.0   Liver Function Tests: Recent Labs  Lab 12/07/19 0552  ALBUMIN 3.6   No results for input(s): LIPASE, AMYLASE in the last 168 hours. No results for input(s): AMMONIA in the last 168 hours. CBC: Recent Labs  Lab 12/04/19 2032 12/05/19 0643 12/05/19 2006  12/06/19 0549 12/07/19 0552  WBC 7.5 4.7 4.8 5.8 6.8  NEUTROABS 6.2  --   --   --   --   HGB 11.1* 10.1* 8.6* 8.0* 9.1*  HCT 34.1* 30.9* 27.0* 24.7* 29.3*  MCV 90.0 88.3 90.6 89.5 93.3  PLT 175 142* 126* 134* 125*   Cardiac Enzymes: No results for input(s): CKTOTAL, CKMB, CKMBINDEX, TROPONINI in the last 168 hours. BNP: Invalid input(s): POCBNP CBG: Recent Labs  Lab 12/06/19 1202  GLUCAP 106*   D-Dimer No results for input(s): DDIMER in the last 72 hours. Hgb A1c No results for input(s): HGBA1C in the last 72 hours. Lipid Profile No results for input(s): CHOL, HDL, LDLCALC, TRIG, CHOLHDL, LDLDIRECT in the last 72 hours. Thyroid function  studies No results for input(s): TSH, T4TOTAL, T3FREE, THYROIDAB in the last 72 hours.  Invalid input(s): FREET3 Anemia work up No results for input(s): VITAMINB12, FOLATE, FERRITIN, TIBC, IRON, RETICCTPCT in the last 72 hours. Urinalysis    Component Value Date/Time   COLORURINE YELLOW (A) 12/04/2019 2054   APPEARANCEUR CLOUDY (A) 12/04/2019 2054   LABSPEC 1.008 12/04/2019 2054   PHURINE 8.0 12/04/2019 2054   GLUCOSEU NEGATIVE 12/04/2019 2054   HGBUR MODERATE (A) 12/04/2019 2054   BILIRUBINUR NEGATIVE 12/04/2019 2054   KETONESUR NEGATIVE 12/04/2019 2054   PROTEINUR 100 (A) 12/04/2019 2054   NITRITE NEGATIVE 12/04/2019 2054   LEUKOCYTESUR LARGE (A) 12/04/2019 2054   Sepsis Labs Invalid input(s): PROCALCITONIN,  WBC,  LACTICIDVEN Microbiology Recent Results (from the past 240 hour(s))  Respiratory Panel by RT PCR (Flu A&B, Covid) - Nasopharyngeal Swab     Status: None   Collection Time: 12/04/19  8:33 PM   Specimen: Nasopharyngeal Swab  Result Value Ref Range Status   SARS Coronavirus 2 by RT PCR NEGATIVE NEGATIVE Final    Comment: (NOTE) SARS-CoV-2 target nucleic acids are NOT DETECTED. The SARS-CoV-2 RNA is generally detectable in upper respiratoy specimens during the acute phase of infection. The lowest concentration of  SARS-CoV-2 viral copies this assay can detect is 131 copies/mL. A negative result does not preclude SARS-Cov-2 infection and should not be used as the sole basis for treatment or other patient management decisions. A negative result may occur with  improper specimen collection/handling, submission of specimen other than nasopharyngeal swab, presence of viral mutation(s) within the areas targeted by this assay, and inadequate number of viral copies (<131 copies/mL). A negative result must be combined with clinical observations, patient history, and epidemiological information. The expected result is Negative. Fact Sheet for Patients:  PinkCheek.be Fact Sheet for Healthcare Providers:  GravelBags.it This test is not yet ap proved or cleared by the [place] FDA and  has been authorized for detection and/or diagnosis of SARS-CoV-2 by FDA under an Emergency Use Authorization (EUA). This EUA will remain  in effect (meaning this test can be used) for the duration of the COVID-19 declaration under Section 564(b)(1) of the Act, 21 U.S.C. section 360bbb-3(b)(1), unless the authorization is terminated or revoked sooner.    Influenza A by PCR NEGATIVE NEGATIVE Final   Influenza B by PCR NEGATIVE NEGATIVE Final    Comment: (NOTE) The Xpert Xpress SARS-CoV-2/FLU/RSV assay is intended as an aid in  the diagnosis of influenza from Nasopharyngeal swab specimens and  should not be used as a sole basis for treatment. Nasal washings and  aspirates are unacceptable for Xpert Xpress SARS-CoV-2/FLU/RSV  testing. Fact Sheet for Patients: PinkCheek.be Fact Sheet for Healthcare Providers: GravelBags.it This test is not yet approved or cleared by the [place] FDA and  has been authorized for detection and/or diagnosis of SARS-CoV-2 by  FDA under an Emergency Use Authorization (EUA).  This EUA will remain  in effect (meaning this test can be used) for the duration of the  Covid-19 declaration under Section 564(b)(1) of the Act, 21  U.S.C. section 360bbb-3(b)(1), unless the authorization is  terminated or revoked. Performed at [facility], [address] [place], [place] [address]      Total time spend on discharging this patient, including the last patient exam, discussing the hospital stay, instructions for ongoing care as it relates to all pertinent caregivers, as well as preparing the medical discharge records, prescriptions, and/or referrals as applicable, is 35  minutes.    Enzo Bi, MD  [facility] 12/09/2019, 2:51 AM  If 7PM-7AM, please contact night-coverage

## 2019-12-07 NOTE — Progress Notes (Signed)
Patient is being discharged home today. She will have round the clock caregivers per patient. IV removed, DC & Rx instructions given and patient acknowledged understanding. Standing weight done (122.7 lbs). Ride will be here to pick her up around 2pm. NT will prepare patient for transport.

## 2019-12-07 NOTE — TOC Initial Note (Signed)
Transition of Care [facility]) - Initial/Assessment Note    Patient Details  Name: Phyllis [name] MRN: [id number] Date of Birth: Dec 22, 1945  [facility]) CM/SW Contact:    Elliot Gurney Knowles, Robinette Phone Number: 12/07/2019, 10:37 AM  Clinical Narrative:                 Patient is a 74 year old female who presented with chest pain and shortness of breath. Patient is a recent discharge from a SNF. Patient to discharge to her own home. Patient states that she a a 24 hour live in caretaker Silva Bandy who is available to pick her up at discharge if needed. In addition, patient states that she a cain, walker, wheelchair, bedside commode and shower chair.[facility] [place].  Patient further reports needing a wheelchair ramp and showed this Education officer, museum resources previously received for assistance with this ([employer] for [employer]). This Education officer, museum encouraged patient to call to follow up on wheelchair ramp needs. Patient previously active with [facility] and would like to use them again. Referral completed today. Patient currently being assessed for oxygen needs. This Education officer, museum to order if needed. Patient verbalized having no additional discharge needs at this time.   Rhylin Venters, LCSW Clinical Social Work [phone]   Expected Discharge Plan: [place] Barriers to Discharge: No Barriers Identified   Patient Goals and CMS Choice   CMS Medicare.gov Compare Post Acute Care list provided to:: Other (Comment Required)(patient requested [place])    Expected Discharge Plan and Services Expected Discharge Plan: [place] In-house Referral: Clinical Social Work   Post Acute Care Choice: [place] arrangements for the past 2 months: [place]                                      Prior Living Arrangements/Services Living arrangements for the  past 2 months: [place]   Patient language and need for interpreter reviewed:: No Do you feel safe going back to the place where you live?: Yes      Need for Family Participation in Patient Care: Yes (Comment) Care giver support system in place?: Yes (comment)(patient has a live in caregiver) Current home services: Homehealth aide Criminal Activity/Legal Involvement Pertinent to Current Situation/Hospitalization: No - Comment as needed  Activities of Daily Living Home Assistive Devices/Equipment: Eyeglasses, Environmental consultant (specify type), Shower chair with back, Grab bars in shower ADL Screening (condition at time of admission) Patient's cognitive ability adequate to safely complete daily activities?: Yes Is the patient deaf or have difficulty hearing?: No Does the patient have difficulty seeing, even when wearing glasses/contacts?: No Does the patient have difficulty concentrating, remembering, or making decisions?: No Patient able to express need for assistance with ADLs?: Yes Does the patient have difficulty dressing or bathing?: Yes Independently performs ADLs?: No Communication: Independent Dressing (OT): Needs assistance Is this a change from baseline?: Pre-admission baseline Grooming: Independent Feeding: Independent Bathing: Needs assistance Is this a change from baseline?: Pre-admission baseline Toileting: Needs assistance Is this a change from baseline?: Pre-admission baseline In/Out Bed: Needs assistance Is this a change from baseline?: Pre-admission baseline Walks in Home: Needs assistance Is this a change from baseline?: Pre-admission baseline Does the patient have difficulty walking or climbing stairs?: Yes Weakness of Legs: Both Weakness of Arms/Hands:  None  Permission Sought/Granted   Permission granted to share information with : Yes, Verbal Permission Granted  Share Information with NAME: Silva Bandy     Permission granted to share info w Relationship:  caregiver  Permission granted to share info w Contact Information: [phone]  Emotional Assessment Appearance:: Appears older than stated age Attitude/Demeanor/Rapport: Engaged Affect (typically observed): Accepting Orientation: : Oriented to Self, Oriented to Place, Oriented to  Time, Oriented to Situation Alcohol / Substance Use: Not Applicable Psych Involvement: No (comment)  Admission diagnosis:  COPD exacerbation (HCC) [J44.1] Acute respiratory failure with hypoxia ([place]) [J96.01] ESRD on hemodialysis ([place]) [N18.6, Z99.2] Patient Active Problem List   Diagnosis Date Noted  . Asymptomatic bacteriuria 12/05/2019  . Acute respiratory failure ([place]) 10/23/2019  . COPD with acute exacerbation ([place]) 10/23/2019  . Elevated troponin 10/23/2019  . CAD (coronary artery disease) 10/23/2019  . COPD exacerbation ([place]) 10/23/2019  . Leukocytosis 10/23/2019  . Anxiety 10/23/2019  . Palliative care by specialist   . Goals of care, counseling/discussion   . Flash pulmonary edema ([place]) 10/14/2019  . Respiratory failure with hypoxia ([place]) 10/11/2019  . 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-stage renal disease) ([place])   . Acute on chronic respiratory failure with hypoxia ([place]) 10/01/2019  . Severe aortic valve stenosis   . Acute respiratory failure with hypoxemia ([place]) 09/20/2019  . COPD with acute bronchitis ([place]) 09/05/2019  . Congestive heart failure ([place]) 05/24/2019  . Hyperkalemia 11/29/2018  . Chronic diastolic heart failure ([place]) 11/02/2018  . HTN (hypertension) 11/02/2018  . Uncontrolled hypertension 10/21/2018  . History of acute pulmonary edema 10/21/2018  . Pressure injury of skin 03/16/2018  . CVA (cerebral vascular accident) ([place]) 01/16/2018  . Aortic atherosclerosis ([place]) 12/11/2017  . Chest pain 09/18/2017  . Hyperlipidemia 08/06/2015  . Complication from renal dialysis device 04/12/2015  . SOB (shortness of breath) 02/01/2015  . ESRD (end stage renal disease) on dialysis ([place]) 02/01/2015   . Type 2 diabetes mellitus with other specified complication ([place]) Q000111Q  . Asthma 02/01/2015  . Acute on chronic diastolic CHF (congestive heart failure) ([place]) 02/01/2015   PCP:  [name], MD Pharmacy:   [place], [place] [address] [place] [address] Phone: [phone] Fax: [place], [place] [address] [place] [address] Phone: [phone] Fax: [phone]  [id number] [place] [place] - [address] [address] Phone: [phone] Fax: [phone]     Social Determinants of Health (SDOH) Interventions    Readmission Risk Interventions Readmission Risk Prevention Plan 10/13/2019 09/26/2019 05/07/2019  Transportation Screening - Complete Complete  Medication Review Press photographer) - Complete Complete  PCP or Specialist appointment within 3-5 days of discharge Complete Complete Complete  HRI or [place] (No Data) Complete Complete  SW Recovery Care/Counseling Consult Complete Complete -  Palliative Care Screening Not Applicable Not Applicable Not Applicable  Skilled Nursing Facility Complete Not Applicable Not Applicable  Some recent data might be hidden

## 2019-12-07 NOTE — Progress Notes (Signed)
[facility], [place] 12/07/19  Subjective:   Hospital day # 3  Patient working with PT today Denies any acute c/o Renal: 01/16 0701 - 01/17 0700 In: 480 [P.O.:480] Out: 2227  Lab Results  Component Value Date   CREATININE 3.81 (H) 12/07/2019   CREATININE 4.03 (H) 12/05/2019   CREATININE 3.40 (H) 12/04/2019     Objective:  Vital signs in last 24 hours:  Temp:  [98.3 F (36.8 C)-98.7 F (37.1 C)] 98.3 F (36.8 C) (01/17 0833) Pulse Rate:  [61-75] 75 (01/17 0833) Resp:  [12-20] 18 (01/17 0833) BP: (129-159)/(39-90) 142/49 (01/17 0833) SpO2:  [98 %-100 %] 100 % (01/17 0833)  Weight change:  Filed Weights   12/04/19 2200  Weight: 59 kg    Intake/Output:    Intake/Output Summary (Last 24 hours) at 12/07/2019 1249 Last data filed at 12/07/2019 0900 Gross per 24 hour  Intake 480 ml  Output 2227 ml  Net -1747 ml     Physical Exam: General:  NAD , ambulatory in the room  HEENT  moist oral mucous membranes  Pulm/lungs  mild scattered rhonchi, room air  CVS/Heart  crescendo systolic murmur  Abdomen:   Soft, nontender  Extremities:  No peripheral edema  Neurologic:  Alert, oriented  Skin:  No acute rashes  Access:  Left arm AV fistula       Basic Metabolic Panel:  Recent Labs  Lab 12/04/19 2032 12/05/19 0643 12/07/19 0552  NA 138 139 137  K 3.7 4.3 3.7  CL 96* 99 98  CO2 30 26 27   GLUCOSE 150* 166* 169*  BUN 15 21 24*  CREATININE 3.40* 4.03* 3.81*  CALCIUM 8.7* 8.7* 7.9*  PHOS  --   --  3.0     CBC: Recent Labs  Lab 12/04/19 2032 12/05/19 0643 12/05/19 2006 12/06/19 0549 12/07/19 0552  WBC 7.5 4.7 4.8 5.8 6.8  NEUTROABS 6.2  --   --   --   --   HGB 11.1* 10.1* 8.6* 8.0* 9.1*  HCT 34.1* 30.9* 27.0* 24.7* 29.3*  MCV 90.0 88.3 90.6 89.5 93.3  PLT 175 142* 126* 134* 125*      Lab Results  Component Value Date   HEPBSAG NON REACTIVE 10/16/2019   HEPBSAB Reactive (A) 10/16/2019       Microbiology:  Recent Results (from the past 240 hour(s))  Respiratory Panel by RT PCR (Flu A&B, Covid) - Nasopharyngeal Swab     Status: None   Collection Time: 12/04/19  8:33 PM   Specimen: Nasopharyngeal Swab  Result Value Ref Range Status   SARS Coronavirus 2 by RT PCR NEGATIVE NEGATIVE Final    Comment: (NOTE) SARS-CoV-2 target nucleic acids are NOT DETECTED. The SARS-CoV-2 RNA is generally detectable in upper respiratoy specimens during the acute phase of infection. The lowest concentration of SARS-CoV-2 viral copies this assay can detect is 131 copies/mL. A negative result does not preclude SARS-Cov-2 infection and should not be used as the sole basis for treatment or other patient management decisions. A negative result may occur with  improper specimen collection/handling, submission of specimen other than nasopharyngeal swab, presence of viral mutation(s) within the areas targeted by this assay, and inadequate number of viral copies (<131 copies/mL). A negative result must be combined with clinical observations, patient history, and epidemiological information. The expected result is Negative. Fact Sheet for Patients:  PinkCheek.be Fact Sheet for Healthcare Providers:  GravelBags.it This test is not yet ap proved or cleared by  the [employer] and  has been authorized for detection and/or diagnosis of SARS-CoV-2 by FDA under an Emergency Use Authorization (EUA). This EUA will remain  in effect (meaning this test can be used) for the duration of the COVID-19 declaration under Section 564(b)(1) of the Act, 21 U.S.C. section 360bbb-3(b)(1), unless the authorization is terminated or revoked sooner.    Influenza A by PCR NEGATIVE NEGATIVE Final   Influenza B by PCR NEGATIVE NEGATIVE Final    Comment: (NOTE) The Xpert Xpress SARS-CoV-2/FLU/RSV assay is intended as an aid in  the diagnosis of influenza  from Nasopharyngeal swab specimens and  should not be used as a sole basis for treatment. Nasal washings and  aspirates are unacceptable for Xpert Xpress SARS-CoV-2/FLU/RSV  testing. Fact Sheet for Patients: PinkCheek.be Fact Sheet for Healthcare Providers: GravelBags.it This test is not yet approved or cleared by the [place] FDA and  has been authorized for detection and/or diagnosis of SARS-CoV-2 by  FDA under an Emergency Use Authorization (EUA). This EUA will remain  in effect (meaning this test can be used) for the duration of the  Covid-19 declaration under Section 564(b)(1) of the Act, 21  U.S.C. section 360bbb-3(b)(1), unless the authorization is  terminated or revoked. Performed at [facility], [address] [place], [place] [address]     Coagulation Studies: No results for input(s): LABPROT, INR in the last 72 hours.  Urinalysis: Recent Labs    12/04/19 2054  COLORURINE YELLOW*  LABSPEC 1.008  PHURINE 8.0  GLUCOSEU NEGATIVE  HGBUR MODERATE*  BILIRUBINUR NEGATIVE  KETONESUR NEGATIVE  PROTEINUR 100*  NITRITE NEGATIVE  LEUKOCYTESUR LARGE*      Imaging: No results found.   Medications:    . amLODipine  10 mg Oral Daily  . carvedilol  12.5 mg Oral BID WC  . clopidogrel  75 mg Oral Daily  . epoetin (EPOGEN/PROCRIT) injection  10,000 Units Intravenous Q T,Th,Sa-HD  . heparin injection (subcutaneous)  5,000 Units Subcutaneous Q8H  . hydrALAZINE  50 mg Oral Q8H  . ipratropium-albuterol  3 mL Nebulization BID  . lidocaine  1 patch Transdermal Q24H  . losartan  50 mg Oral Daily  . oxybutynin  15 mg Oral Daily  . pantoprazole  40 mg Oral Daily  . pravastatin  40 mg Oral QHS  . predniSONE  30 mg Oral Q breakfast   acetaminophen, ALPRAZolam, HYDROcodone-acetaminophen, morphine injection, ondansetron (ZOFRAN) IV  Assessment/ Plan:  74 y.o. female with  end stage renal disease on  hemodialysis, aortic stenosis, hypertension, chronic costochondritis, COPD, diabetes mellitus type II, overactive bladder, gout, GERD  admitted on 12/04/2019 for COPD exacerbation ([place]) [J44.1] Acute respiratory failure with hypoxia ([place]) [J96.01] ESRD on hemodialysis ([place]) [N18.6, Z99.2]  [facility] TTS [address] Left AVF 58kg   #End-stage renal disease 2200 cc removed with HD yesterday Standing weight requested Patient to continue routine HD schedule TTS  #Anemia of chronic kidney disease Epogen 10,000 unit IV Tuesday, Thursday, Saturday schedule with dialysis  #Secondary hyperparathyroidism Monitor calcium and phosphorus during admission Lab Results  Component Value Date   PTH 160 (H) 05/24/2019   CALCIUM 7.9 (L) 12/07/2019   CAION 1.10 (L) 10/14/2019   PHOS 3.0 12/07/2019       LOS: 3 Escarlet Saathoff 1/17/202112:49 PM  [place], [place]  Note: This note was prepared with Dragon dictation. Any transcription errors are unintentional

## 2019-12-08 NOTE — Progress Notes (Deleted)
Cardiology Office Note    Date:  12/08/2019   ID:  Phyllis [name], DOB 1946/10/16, MRN [id number]  PCP:  [name], MD  Cardiologist:  Ida Rogue, MD  Electrophysiologist:  None   Chief Complaint: Hospital follow-up  History of Present Illness:   Phyllis [name] is a 74 y.o. female with history of severe aortic valve stenosis, ESRD on HD TTS, recurrent flash pulmonary edema, recurrent respiratory failure/recent PEA arrest, anemia of chronic disease, aortic atherosclerosis, DM 2, HTN, HLD, obesity, asthma, COPD, OSA, physical deconditioning, and anxiety who presents for multiple hospital follow-ups.  Over the past year the patient has been admitted or seen in the ED 14 times for various complaints including chest discomfort, COPD and heart failure exacerbations in the setting of aortic valve disease as well as PEA arrest, most recently earlier this month.  She has known chronic chest pain felt to be secondary to costochondritis with negative stress testing in the past. She has known aortic stenosis that has been followed with periodic echo. Echo in 04/2019, during admission for atypical chest pain, showed normal LV systolic function, moderate aortic stenosis with a mean gradient 25 mmHg and valve area 0.98 cm. No further ischemic cardiac work-up was recommended. She was admitted to the hospital in 05/2019 with volume overload with volume managed by hemodialysis. There was some concern she may have low flow low gradient severe aortic stenosis. She was admitted to the hospital in early 09/2019 with flash pulmonary edema requiring intubation for airway protection and underwent daily hemodialysis with subsequent improvement in volume status. Repeat echo on 09/21/2019 showed an EF of 60 to 65%, mild LVH, normal RV systolic function and cavity size, mild to moderate left atrial dilatation, mild mitral regurgitation, moderate aortic valve stenosis by gradients and severe stenosis  by valve area with a mean gradient of 20.6 mmHg and a valve area of 0.68 cm. It was recommended her Lasix be changed to torsemide on non-dialysis days for further optimization of volume management. In the setting of her aortic valve disease her euvolemic window has been quite narrow. She was also treated with a 7-day course of antibiotic for possible pneumonia. She was noted to be significantly deconditioned with recommendation to pursue aggressive work-up with physical therapy followed by outpatient evaluation by the structural heart team. She was subsequently discharged to [facility] on 09/30/2019 after undergoing her regular hemodialysis. While at [facility] she developed sudden shortness of breath while laying in the bed 2 days after her discharge and was brought back to [facility] with recurrent flash pulmonary edema requiring emergent hemodialysis.  She was noted to have a downtrending hemoglobin and underwent packed red blood cell transfusion x1.  It was recommended she undergo more aggressive dialysis and follow-up with structural heart team as outpatient.    She return for a third time in late 09/2019 with respiratory distress, recurrent flash pulmonary edema, and PEA arrest requiring brief episode of CPR and mechanical ventilation.  She underwent emergent hemodialysis.  Initially, plans were for palliative/comfort care given her comorbid conditions and overall poor prognosis.  However, upon extubation the patient was alert and oriented and wished to pursue further aggressive care.    She was subsequently transferred to [facility] and evaluated by our structural heart team and felt to be very high risk of conventional surgical aortic valve replacement.  She underwent diagnostic R/LHC which showed severe, calcified ostial distal left main 80% stenosis, severe ostial LAD 99% stenosis followed  by an eccentric 50 to 70% proximal LAD stenosis after the first diagonal, ostial to proximal 60%  dominant LCx stenosis, 60 to 70% OM 2 stenosis, nondominant RCA, moderately severe to severe aortic stenosis with a valve area of 0.99 cm peak to peak gradient 20 mmHg, mean gradient 28 mmHg, cardiac output 7.2, cardiac index 4.83.  Case was discussed amongst interventional cardiology and structural cardiology with patient not felt to be a candidate for CABG, high risk PCI, conventional surgical aortic valve replacement, or TAVR with overall poor prognosis with recommendation to follow-up with palliative care and hospice.  She was evaluated by palliative care with recommendation for DNR and understood her poor prognosis.  She did wish to continue hemodialysis.  She was not eligible for hospice services with the desire to continue hemodialysis.  Most recently, she was admitted to the hospital earlier this month with acute on chronic respiratory failure with hypoxia felt to be multifactorial including COPD, poor inspiratory effort, anemia, aortic stenosis, and deconditioning as well as chronic chest felt to be related to her chronic costochondritis and CPR.  ***   Labs independently reviewed: 11/2019 - potassium 3.7, BUN 24, serum creatinine 3.1, Hgb 9.1, PLT 125, BNP 1007, high-sensitivity troponin 22 with a delta of 23, COVID-19 negative, influenza negative 10/2019 - albumin 3.5, AST/LT normal  Past Medical History:  Diagnosis Date  . (HFpEF) heart failure with preserved ejection fraction ([place])    a. TTE 01/2014: nl LV sys fxn, no valvular abnormalities; b. TTE 11/16: nl EF, mild LVH;  c. 04/2019 Echo: EF 60-65%. DD. Nl RV fxn. Mod AS. 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  . Allergy   . Anemia of chronic disease   . Anxiety   . Aortic atherosclerosis ([place])   . Asthma   . Chronic back pain   . COPD (chronic obstructive pulmonary disease) ([place])   . Diabetes mellitus with complication ([place])   . ESRD on hemodialysis ([place])    a. Tues/Sat; b. 2/2 small kidneys  . Essential  hypertension   . Fistula    lower left arm  . GERD (gastroesophageal reflux disease)   . Gout   . History of exercise stress test    a. 01/2014: no evidence of ischemia; b. Lexiscan 08/2015: no sig ischemia, severe GI uptake artifact, low risk; c. CPET @ [facility] 09/2016: exercised 3 min 12 sec on bike without incline, 2.28 METs, VO2 of 8.1, 48% of predicted, indicating mod to sev functional impairment, evidence of blunted HR, stroke volume, and BP augmentation as well as ventilation-perfusion mismatch with exercise  . HLD (hyperlipidemia)   . Non-obstructive Carotid arterial disease ([place])    a. 12/2017: <50% bilat ICA dzs.  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 Severe aortic stenosis    a. by 09/2019 echo b. 04/2019 Echo: Mod AS.  Marland Kitchen Sleep apnea     Past Surgical History:  Procedure Laterality Date  . carpel tunnel    . GALLBLADDER SURGERY    . PERIPHERAL VASCULAR CATHETERIZATION N/A 04/12/2015   Procedure: A/V Shuntogram/Fistulagram;  Surgeon: Algernon Huxley, MD;  Location: [place] [facility];  Service: Cardiovascular;  Laterality: N/A;  . PERIPHERAL VASCULAR CATHETERIZATION N/A 04/12/2015   Procedure: A/V Shunt Intervention;  Surgeon: Algernon Huxley, MD;  Location: [place] [facility];  Service: Cardiovascular;  Laterality: N/A;  . PERIPHERAL VASCULAR CATHETERIZATION N/A 06/09/2015   Procedure: Dialysis/Perma Catheter Removal;  Surgeon: Belenda Cruise  Eloise Levels, MD;  Location: [place] [facility];  Service: Cardiovascular;  Laterality: N/A;  . RIGHT/LEFT HEART CATH AND CORONARY ANGIOGRAPHY N/A 10/14/2019   Procedure: RIGHT/LEFT HEART CATH AND CORONARY ANGIOGRAPHY;  Surgeon: Belva Crome, MD;  Location: [place] [facility];  Service: Cardiovascular;  Laterality: N/A;    Current Medications: No outpatient medications have been marked as taking for the 12/10/19 encounter (Appointment) with Rise Mu, PA-C.    Allergies:   Enalapril maleate; Nitrofurantoin;  Sulfamethoxazole-trimethoprim; 2,4-d dimethylamine (amisol); Baclofen; Neosporin [neomycin-bacitracin zn-polymyx]; Quinine; Ultram [tramadol]; Zocor [simvastatin]; Bactrim [sulfamethoxazole-trimethoprim]; Levodopa; Macrodantin [nitrofurantoin macrocrystal]; and Quinine derivatives   Social History   Socioeconomic History  . Marital status: Single    Spouse name: Not on file  . Number of children: Not on file  . Years of education: Not on file  . Highest education level: Not on file  Occupational History  . Not on file  Tobacco Use  . Smoking status: Never Smoker  . Smokeless tobacco: Never Used  Substance and Sexual Activity  . Alcohol use: No  . Drug use: No  . Sexual activity: Not Currently  Other Topics Concern  . Not on file  Social History Narrative  . Not on file   Social Determinants of Health   Financial Resource Strain:   . Difficulty of Paying Living Expenses: Not on file  Food Insecurity:   . Worried About Charity fundraiser in the Last Year: Not on file  . Ran Out of Food in the Last Year: Not on file  Transportation Needs:   . Lack of Transportation (Medical): Not on file  . Lack of Transportation (Non-Medical): Not on file  Physical Activity:   . Days of Exercise per Week: Not on file  . Minutes of Exercise per Session: Not on file  Stress:   . Feeling of Stress : Not on file  Social Connections:   . Frequency of Communication with Friends and Family: Not on file  . Frequency of Social Gatherings with Friends and Family: Not on file  . Attends Religious Services: Not on file  . Active Member of Clubs or Organizations: Not on file  . Attends Archivist Meetings: Not on file  . Marital Status: Not on file     Family History:  The patient's family history includes Heart attack in her brother; Heart attack (age of onset: 52) in her father; Heart disease in her brother and father; Hyperlipidemia in her father and mother; Hypertension in her father  and mother. There is no history of Breast cancer.  ROS:   ROS   EKGs/Labs/Other Studies Reviewed:    Studies reviewed were summarized above. The additional studies were reviewed today: ***  EKG:  EKG is ordered today.  The EKG ordered today demonstrates ***  Recent Labs: 10/21/2019: Magnesium 2.2 10/31/2019: ALT 10 12/04/2019: B Natriuretic Peptide 1,007.0 12/07/2019: BUN 24; Creatinine, Ser 3.81; Hemoglobin 9.1; Platelets 125; Potassium 3.7; Sodium 137  Recent Lipid Panel    Component Value Date/Time   CHOL 180 01/17/2018 0241   TRIG 154 (H) 01/17/2018 0241   HDL 48 01/17/2018 0241   CHOLHDL 3.8 01/17/2018 0241   VLDL 31 01/17/2018 0241   LDLCALC 101 (H) 01/17/2018 0241    PHYSICAL EXAM:    VS:  There were no vitals taken for this visit.  BMI: There is no height or weight on file to calculate BMI.  Physical Exam  Wt Readings from Last 3 Encounters:  12/07/19 122 lb 11.2 oz (55.7 kg)  10/31/19 124 lb (56.2 kg)  10/24/19 119 lb 0.8 oz (54 kg)     ASSESSMENT & PLAN:   1. ***  Disposition: F/u with Dr. Rockey Situ or an APP in ***.   Medication Adjustments/Labs and Tests Ordered: Current medicines are reviewed at length with the patient today.  Concerns regarding medicines are outlined above. Medication changes, Labs and Tests ordered today are summarized above and listed in the Patient Instructions accessible in Encounters.   Signed, Christell Faith, PA-C 12/08/2019 9:14 AM     [facility] [address] [place] [address] [place], [place] [address] [phone]

## 2019-12-10 ENCOUNTER — Ambulatory Visit: Payer: PRIVATE HEALTH INSURANCE | Admitting: Physician Assistant

## 2019-12-12 ENCOUNTER — Other Ambulatory Visit: Payer: Self-pay

## 2019-12-12 MED ORDER — HYDRALAZINE HCL 50 MG PO TABS: 50.0000 mg | ORAL_TABLET | Freq: Three times a day (TID) | ORAL | 0 refills | Status: DC

## 2019-12-12 NOTE — Progress Notes (Signed)
Cardiology Office Note  Date:  12/19/2019   ID:  Phyllis [name], DOB [birth year], MRN [id number]  PCP:  [name], MD   Chief Complaint  Patient presents with  . office visit    ED F/U-Patient reports difficulty sleeping; MEds verbally reviewed with patient.    HPI:  [name] is a very pleasant 74 year old woman with history of  chronic renal failure, on hemodialysis 3 days per week  diffuse aortic atherosclerosis seen on CT scan 2014,  obesity,  hypertension,  hyperlipidemia  Aortic valve stenosis, echocardiogram November 2020 Moderate aortic valve stenosis by gradients, severe stenosis by  estimated AVA 0.68 cm who presents for follow-up of her hypertension, atherosclerosis, episodes of chest pain.  hx of PEA arrest in 09/2019  Chronic chest pain prior to and since that time  Heart catheterization 10/14/19 with severe left main and ostial LAD disease.Shewas not a candidate for sternotomy/CABG or high risk PCI.Palliative care team was consulted  DNR but no home hospice because she wished to continue hemodialysis".  patient wants to be partial code, OK for intubation, but no CPR per the notes from emergency room October 25, 2019  Losing weight, over past 3 years In 2018 weight 141 In 2019 weight 134 In 2021 weight 119  She presents today with a caretaker, She is not eating, missing many meals, no desire to eat Very sedentary, sitting most of the day, legs getting weaker, some muscle atrophy Caretaker very concerned  Recent hospital discharge earlier January 2021 for costochondritis, Managed on pain medication as needed with lidocaine patch -She had chest pain shortness of breath Was hypoxic initially requiring BiPAP and weaned to 5 L She was symptomatic despite receiving dialysis earlier that day Note indicating she is supposed to be on BiPAP as outpatient Notes from nephrology, she was taken down to her dry weight 58 kg after dialysis before  admission -She receives EPO with hemodialysis treatment  She reports that she was told in [place] that she may only live for months, has been very depressed Not sleeping well at nighttime Tried various sleep aids such as Benadryl, melatonin, does not seem to work Low-dose Xanax also may be an effective, 0.25  EKG personally reviewed by myself on todays visit Shows normal sinus rhythm rate 98 bpm no significant ST-T wave changes  Other past medical history reviewed In the emergency room February 2020 for chest pain Felt to be atypical in nature  Admitted to the hospital May 04, 2019 for shortness of breath Shortness of breath felt secondary to anemia, physical deconditioning, aortic valve stenosis, Fluid status managed by hemodialysis  Readmitted to the hospital May 23, 2019 for shortness of breath Had dialysis, lasix IV   Echo 04/2019 The left ventricle has normal systolic function with an ejection fraction of 60-65%. The cavity size was normal.  2. The right ventricle has normal systolic function.   3. Left atrial size was mild-moderately dilated.  4. The aortic valve  Moderate stenosis of the aortic valve.   Stress test 09/2017 Showing no significant ischemia Normal wall motion, EF estimated at 52% Low risk scan  Previous CT abd in 2014: Diffuse athero in aorta  admission to the hospital 12/04/2014 for acute respiratory failure with hypoxia, found to have acute on chronic renal failure, 25 pound weight gain  AV fistula was not working on the left and temporary dialysis catheter was placed and dialysis performed 3 in the hospital.  stress test 02/12/2014 showing no ischemia echocardiogram  March 2015 showing normal LV function, no significant valve problems  PMH:   has a past medical history of (HFpEF) heart failure with preserved ejection fraction ([place]), Allergy, Anemia of chronic disease, Anxiety, Aortic atherosclerosis ([place]), Asthma, Chronic back pain, COPD (chronic  obstructive pulmonary disease) ([place]), Diabetes mellitus with complication ([place]), ESRD on hemodialysis ([place]), Essential hypertension, Fistula, GERD (gastroesophageal reflux disease), Gout, History of exercise stress test, HLD (hyperlipidemia), Non-obstructive Carotid arterial disease ([place]), 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, Severe aortic stenosis, and Sleep apnea.  PSH:    Past Surgical History:  Procedure Laterality Date  . CARDIAC CATHETERIZATION    . carpel tunnel    . GALLBLADDER SURGERY    . PERIPHERAL VASCULAR CATHETERIZATION N/A 04/12/2015   Procedure: A/V Shuntogram/Fistulagram;  Surgeon: Algernon Huxley, MD;  Location: [facility];  Service: Cardiovascular;  Laterality: N/A;  . PERIPHERAL VASCULAR CATHETERIZATION N/A 04/12/2015   Procedure: A/V Shunt Intervention;  Surgeon: Algernon Huxley, MD;  Location: [facility];  Service: Cardiovascular;  Laterality: N/A;  . PERIPHERAL VASCULAR CATHETERIZATION N/A 06/09/2015   Procedure: Dialysis/Perma Catheter Removal;  Surgeon: Katha Cabal, MD;  Location: [facility];  Service: Cardiovascular;  Laterality: N/A;  . RIGHT/LEFT HEART CATH AND CORONARY ANGIOGRAPHY N/A 10/14/2019   Procedure: RIGHT/LEFT HEART CATH AND CORONARY ANGIOGRAPHY;  Surgeon: Belva Crome, MD;  Location: [facility];  Service: Cardiovascular;  Laterality: N/A;    Current Outpatient Medications  Medication Sig Dispense Refill  . acetaminophen (TYLENOL) 500 MG tablet Take 1 tablet (500 mg total) by mouth every 6 (six) hours as needed for mild pain or fever. 30 tablet 0  . albuterol (PROVENTIL HFA;VENTOLIN HFA) 108 (90 Base) MCG/ACT inhaler Inhale 2 puffs into the lungs every 6 (six) hours as needed for wheezing or shortness of breath. Out of medication    . ALPRAZolam (XANAX) 0.25 MG tablet Take 0.25 mg by mouth every 6 (six) hours as needed for anxiety.    Marland Kitchen amLODipine (NORVASC) 10 MG tablet Take 10 mg by mouth daily.     . benzonatate  (TESSALON) 100 MG capsule Take 1 capsule (100 mg total) by mouth 2 (two) times daily as needed for cough. 20 capsule 0  . carvedilol (COREG) 12.5 MG tablet Take 12.5 mg by mouth 2 (two) times daily with a meal.     . cetirizine (ZYRTEC) 10 MG tablet Take 10 mg by mouth daily as needed for allergies.     Marland Kitchen clopidogrel (PLAVIX) 75 MG tablet Take 1 tablet (75 mg total) by mouth daily. 30 tablet 0  . hydrALAZINE (APRESOLINE) 50 MG tablet Take 1 tablet (50 mg total) by mouth every 8 (eight) hours. 90 tablet 0  . HYDROcodone-acetaminophen (NORCO) 7.5-325 MG tablet Take 1 tablet by mouth every 6 (six) hours as needed (pain). 12 tablet 0  . ipratropium-albuterol (DUONEB) 0.5-2.5 (3) MG/3ML SOLN Take 3 mLs by nebulization every 6 (six) hours as needed. 360 mL   . lidocaine (LIDODERM) 5 % Place 1 patch onto the skin daily. Remove & Discard patch within 12 hours or as directed by MD 30 patch 0  . lidocaine-prilocaine (EMLA) cream Apply 1 application topically as needed (prior to treatment).     . losartan (COZAAR) 50 MG tablet Take 50 mg by mouth daily.    . ondansetron (ZOFRAN) 4 MG tablet Take 4 mg by mouth every 8 (eight) hours as needed.    Marland Kitchen oxybutynin (DITROPAN XL) 15  MG 24 hr tablet Take 15 mg by mouth daily.    . pantoprazole (PROTONIX) 40 MG tablet Take 1 tablet (40 mg total) by mouth daily. 30 tablet 1  . pravastatin (PRAVACHOL) 40 MG tablet Take 40 mg by mouth at bedtime.     . topiramate (TOPAMAX) 25 MG tablet Take 1 tablet (25 mg total) by mouth as needed (for headaches).    Marland Kitchen scopolamine (TRANSDERM-SCOP) 1 MG/3DAYS Place 1 patch onto the skin every 3 (three) days.     No current facility-administered medications for this visit.     Allergies:   Enalapril maleate; Nitrofurantoin; Sulfamethoxazole-trimethoprim; 2,4-d dimethylamine (amisol); Baclofen; Neosporin [neomycin-bacitracin zn-polymyx]; Quinine; Ultram [tramadol]; Zocor [simvastatin]; Bactrim [sulfamethoxazole-trimethoprim]; Levodopa;  Macrodantin [nitrofurantoin macrocrystal]; and Quinine derivatives   Social History:  The patient  reports that she has never smoked. She has never used smokeless tobacco. She reports that she does not drink alcohol or use drugs.   Family History:   family history includes Heart attack in her brother; Heart attack (age of onset: 71) in her father; Heart disease in her brother and father; Hyperlipidemia in her father and mother; Hypertension in her father and mother.    Review of Systems  Constitutional: Positive for weight loss.  HENT: Negative.   Respiratory: Negative.   Cardiovascular: Negative.   Gastrointestinal: Negative.   Musculoskeletal: Negative.   Neurological: Negative.   Psychiatric/Behavioral: Positive for depression. The patient has insomnia.   All other systems reviewed and are negative.   PHYSICAL EXAM: VS:  BP (!) 160/60 (BP Location: Right Arm, Patient Position: Sitting, Cuff Size: Normal)   Pulse 80   Ht 4\' 10"  (1.473 m)   Wt 119 lb (54 kg)   SpO2 97%   BMI 24.87 kg/m  , BMI Body mass index is 24.87 kg/m. GEN: Well nourished, well developed, in no acute distress, obese  Presents with a walker and wheelchair HEENT: normal  Neck: no JVD, carotid bruits, or masses Cardiac: RRR; 3/6 systolic ejection murmur right sternal border no edema  Respiratory:  clear to auscultation bilaterally, normal work of breathing GI: soft, nontender, nondistended, + BS MS: no deformity or atrophy  Skin: warm and dry, no rash Neuro:  Strength and sensation are intact Psych: euthymic mood, full affect   Recent Labs: 10/21/2019: Magnesium 2.2 10/31/2019: ALT 10 12/04/2019: B Natriuretic Peptide 1,007.0 12/07/2019: BUN 24; Creatinine, Ser 3.81; Hemoglobin 9.1; Platelets 125; Potassium 3.7; Sodium 137    Lipid Panel Lab Results  Component Value Date   CHOL 180 01/17/2018   HDL 48 01/17/2018   LDLCALC 101 (H) 01/17/2018   TRIG 154 (H) 01/17/2018      Wt Readings from Last  3 Encounters:  12/19/19 119 lb (54 kg)  12/07/19 122 lb 11.2 oz (55.7 kg)  10/31/19 124 lb (56.2 kg)       ASSESSMENT AND PLAN:  Chronic diastolic CHF (congestive heart failure) (HCC) - Appears relatively euvolemic, managed by hemodialysis She reports nephrology has been more aggressive with her HD No leg edema, no orthopnea PND  Hyperlipidemia, unspecified hyperlipidemia type - Plan: EKG 12-Lead Remote numbers LDL mildly elevated, Recommend she continue with her pravastatin, Numbers may be improving with recent weight loss We will check again in follow-up  SOB (shortness of breath) - Plan: EKG 12-Lead Severe underlying coronary disease, minimally active at home Medical management  End stage renal disease ([place]) - Plan: EKG 12-Lead Has hemodialysis 3 days per week  Managed by Dr. Shelly Flatten euvolemic  Severe aortic valve stenosis Not a candidate for TAVR per the notes Not a candidate for open heart surgery Medical management recommended  Dependence on hemodialysis ([place]) - Plan: EKG 12-Lead HD 3x a week, She has a low dry weight, reports doing better  Type 2 diabetes mellitus with other specified complication, unspecified long term insulin use status ([place]) - Plan: EKG 12-Lead Numbers well controlled Not eating well recently, significant weight loss  CAD with stable angina Recent cardiac catheterization results reviewed with her, not a candidate for intervention  Long discussion with her concerning recent findings on cardiac catheterization, surgeon findings Discussed insomnia, recommend she try Xanax for anxiety and sleep Needs to eat more, continues to lose weight from missing meals  total encounter time more than 25 minutes  Greater than 50% was spent in counseling and coordination of care with the patient   Disposition:   F/U  6 months   No orders of the defined types were placed in this encounter.    Signed, Esmond Plants, M.D., Ph.D. 12/19/2019  [place], [place]

## 2019-12-18 ENCOUNTER — Telehealth: Payer: Self-pay | Admitting: Primary Care

## 2019-12-18 NOTE — Telephone Encounter (Signed)
[facility] states patient was d/c'ed and went home. T/c to home number, no ability to leave message. Will advise pcp and request another consultation request if indicated.

## 2019-12-19 ENCOUNTER — Other Ambulatory Visit: Payer: Self-pay

## 2019-12-19 ENCOUNTER — Encounter: Payer: Self-pay | Admitting: Cardiovascular Disease

## 2019-12-19 ENCOUNTER — Ambulatory Visit (INDEPENDENT_AMBULATORY_CARE_PROVIDER_SITE_OTHER): Payer: Medicare Other | Admitting: Cardiovascular Disease

## 2019-12-19 VITALS — BP 160/60 | HR 80 | Ht <= 58 in | Wt 119.0 lb

## 2019-12-19 DIAGNOSIS — I700 Atherosclerosis of aorta: Secondary | ICD-10-CM

## 2019-12-19 DIAGNOSIS — I5032 Chronic diastolic (congestive) heart failure: Secondary | ICD-10-CM

## 2019-12-19 DIAGNOSIS — N186 End stage renal disease: Secondary | ICD-10-CM

## 2019-12-19 DIAGNOSIS — Z992 Dependence on renal dialysis: Secondary | ICD-10-CM

## 2019-12-19 DIAGNOSIS — I350 Nonrheumatic aortic (valve) stenosis: Secondary | ICD-10-CM

## 2019-12-19 DIAGNOSIS — E782 Mixed hyperlipidemia: Secondary | ICD-10-CM

## 2019-12-19 DIAGNOSIS — I10 Essential (primary) hypertension: Secondary | ICD-10-CM

## 2019-12-19 NOTE — Patient Instructions (Signed)
Blood pressure range [id number] systolic Check standing up   Increase carbs Try xanax x 1-2 before  sleep  Medication Instructions:  No changes  If you need a refill on your cardiac medications before your next appointment, please call your pharmacy.    Lab work: No new labs needed   If you have labs (blood work) drawn today and your tests are completely normal, you will receive your results only by: Marland Kitchen MyChart Message (if you have MyChart) OR . A paper copy in the mail If you have any lab test that is abnormal or we need to change your treatment, we will call you to review the results.   Testing/Procedures: No new testing needed   Follow-Up: At [facility], you and your health needs are our priority.  As part of our continuing mission to provide you with exceptional heart care, we have created designated Provider Care Teams.  These Care Teams include your primary Cardiologist (physician) and Advanced Practice Providers (APPs -  Physician Assistants and Nurse Practitioners) who all work together to provide you with the care you need, when you need it.  . You will need a follow up appointment in 6 months  . Providers on your designated Care Team:   . Murray Hodgkins, NP . Christell Faith, PA-C . Marrianne Mood, PA-C  Any Other Special Instructions Will Be Listed Below (If Applicable).  For educational health videos Log in to : [email or web address] Or : [email or web address], password : [email or web address]

## 2019-12-25 ENCOUNTER — Telehealth: Payer: Self-pay | Admitting: Adult Health Nurse Practitioner

## 2019-12-25 NOTE — Telephone Encounter (Signed)
Called patient's cell to schedule a Palliative f/u visit (after being discharged home from [facility]), no answer - left message with reason for call along with my contact number

## 2019-12-31 ENCOUNTER — Telehealth: Payer: Self-pay | Admitting: Adult Health Nurse Practitioner

## 2019-12-31 NOTE — Telephone Encounter (Signed)
Spoke with patient and have scheduled a Telephone Palliative f/u visit on 01/14/20 @ 2 PM

## 2020-01-03 ENCOUNTER — Other Ambulatory Visit: Payer: Self-pay | Admitting: Cardiovascular Disease

## 2020-01-14 ENCOUNTER — Other Ambulatory Visit: Payer: Self-pay

## 2020-01-14 ENCOUNTER — Other Ambulatory Visit: Payer: Medicare Other | Admitting: Adult Health Nurse Practitioner

## 2020-01-14 DIAGNOSIS — Z515 Encounter for palliative care: Secondary | ICD-10-CM

## 2020-01-14 DIAGNOSIS — I5032 Chronic diastolic (congestive) heart failure: Secondary | ICD-10-CM

## 2020-01-14 NOTE — Progress Notes (Signed)
[facility] Consult Note Telephone: [phone]  Fax: [phone]  [name] DOB: 07/13/1946 MRN: [id number]  PRIMARY CARE PROVIDER:   [name], MD  REFERRING PROVIDER:  [name], [place] [address] [facility] [place],  [place] [address]  RESPONSIBLE PARTY:   Primary Emergency Contact: Marlene Lard Mobile Phone: [phone] Relation: Friend Secondary Emergency Contact: Orbie Pyo Mobile Phone: [phone] Relation: Friend  Due to the COVID-19 crisis, this visit was done via telemedicine and it was initiated and consent by this patient and or family. Video-audio (telehealth) contact was unable to be done due to technical barriers from the patient's side.    RECOMMENDATIONS and PLAN:  1.  Advanced care planning. Patient is full code.  She shared that she had CPR last year and would not be here without and wants to have CPR if needed in the future.    2.  ESRD/CHF/COPD.  Patient has dialysis on T,Th,Sat.  States that dialysis is going well and has no new concerns.  Denies increased SOB or cough, fever, N/V/D, constipation, headaches, dizziness.  States that she feels the best that she has in over a year. Appetite is good and weight has been stable at 120 pounds. Is being seen by Dr. Rockey Situ with cardiology.  Continue dialysis and follow up and recommendations by cardiology  3.  Pain. Still has costochondritis from CPR done in 09/2019.  Gets relief with lidocaine patches and uses hydrocodone/APAP 7.5/325 mg 1 tab daily.  Continue pain regimen  4.  Functional status.  Patient is able to ambulate with walker around the house. Uses wheelchair when going out to dialysis.  She does require some assistance with ADLs. Has shower bench in shower.  Needs help getting in/out of the shower but can bathe herself.  She has 24/7 cargivers in the home to help take care of her, clean the house, and  prepare meals.    Palliative care will continue to monitor for symptom management/decline and make recommendations as needed.  Patient wanted call back in 6-8 weeks.  I spent 35 minutes providing this consultation,  from 2:00 to 2:35including time with patient/family, chart review, provider coordination, and documentation . More than 50% of the time in this consultation was spent coordinating communication.   HISTORY OF PRESENT ILLNESS:  Phyllis [name] is a 74 y.o. year old female with multiple medical problems including ESRD, CHF, Acute on chronic respiratory failure, COPD, debility. Palliative Care was asked to help address goals of care. Patient was in rehab at a SNF after hospitalization for acute respiratory failure in December 2020.  She was home for one day when she was admitted to hospital 1/14-1/17/2021 for acute on chronic respiratory failure.  She was discharged back home.  CODE STATUS: full code  PPS: 40% HOSPICE ELIGIBILITY/DIAGNOSIS: TBD  PHYSICAL EXAM:   Deferred   PAST MEDICAL HISTORY:  Past Medical History:  Diagnosis Date  . (HFpEF) heart failure with preserved ejection fraction ([place])    a. TTE 01/2014: nl LV sys fxn, no valvular abnormalities; b. TTE 11/16: nl EF, mild LVH;  c. 04/2019 Echo: EF 60-65%. DD. Nl RV fxn. Mod AS. 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  . Allergy   . Anemia of chronic disease   . Anxiety   . Aortic atherosclerosis ([place])   . Asthma   . Chronic back pain   . COPD (chronic obstructive pulmonary disease) ([place])   .  Diabetes mellitus with complication ([place])   . ESRD on hemodialysis ([place])    a. Tues/Sat; b. 2/2 small kidneys  . Essential hypertension   . Fistula    lower left arm  . GERD (gastroesophageal reflux disease)   . Gout   . History of exercise stress test    a. 01/2014: no evidence of ischemia; b. Lexiscan 08/2015: no sig ischemia, severe GI uptake artifact, low risk; c. CPET @ [facility] 09/2016: exercised 3 min 12 sec  on bike without incline, 2.28 METs, VO2 of 8.1, 48% of predicted, indicating mod to sev functional impairment, evidence of blunted HR, stroke volume, and BP augmentation as well as ventilation-perfusion mismatch with exercise  . HLD (hyperlipidemia)   . Non-obstructive Carotid arterial disease ([place])    a. 12/2017: <50% bilat ICA dzs.  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 Severe aortic stenosis    a. by 09/2019 echo b. 04/2019 Echo: Mod AS.  Marland Kitchen Sleep apnea     SOCIAL HX:  Social History   Tobacco Use  . Smoking status: Never Smoker  . Smokeless tobacco: Never Used  Substance Use Topics  . Alcohol use: No    ALLERGIES:  Allergies  Allergen Reactions  . Enalapril Maleate Other (See Comments)    Other reaction(s): Headache  . Nitrofurantoin Swelling and Rash    Other Reaction: swelling of body  . Sulfamethoxazole-Trimethoprim Swelling  . 2,4-D Dimethylamine (Amisol) Rash and Other (See Comments)    Other Reaction: h/a  . Baclofen Other (See Comments) and Nausea Only    lightheadness ,drowsiness , muscle weakness , twitching in hands   . Neosporin [Neomycin-Bacitracin Zn-Polymyx] Other (See Comments) and Rash    Other Reaction: irritation Skin irritation   . Quinine Nausea And Vomiting, Rash and Other (See Comments)    Other Reaction: Vomiting, rash, h/a, vision  . Ultram [Tramadol] Palpitations  . Zocor [Simvastatin] Other (See Comments) and Rash    Other Reaction: muscle spasms Muscle pain and spasms  . Bactrim [Sulfamethoxazole-Trimethoprim] Swelling  . Levodopa Other (See Comments)    Reaction: unknown  . Macrodantin [Nitrofurantoin Macrocrystal] Swelling  . Quinine Derivatives Other (See Comments)    Vertigo,nausea vomiting blurred vision headache ears sensitivity      PERTINENT MEDICATIONS:  Outpatient Encounter Medications as of 01/14/2020  Medication Sig  . acetaminophen (TYLENOL) 500 MG tablet Take 1 tablet (500 mg total) by mouth every 6  (six) hours as needed for mild pain or fever.  Marland Kitchen albuterol (PROVENTIL HFA;VENTOLIN HFA) 108 (90 Base) MCG/ACT inhaler Inhale 2 puffs into the lungs every 6 (six) hours as needed for wheezing or shortness of breath. Out of medication  . ALPRAZolam (XANAX) 0.25 MG tablet Take 0.25 mg by mouth every 6 (six) hours as needed for anxiety.  Marland Kitchen amLODipine (NORVASC) 10 MG tablet Take 10 mg by mouth daily.   . benzonatate (TESSALON) 100 MG capsule Take 1 capsule (100 mg total) by mouth 2 (two) times daily as needed for cough.  . carvedilol (COREG) 12.5 MG tablet Take 12.5 mg by mouth 2 (two) times daily with a meal.   . cetirizine (ZYRTEC) 10 MG tablet Take 10 mg by mouth daily as needed for allergies.   Marland Kitchen clopidogrel (PLAVIX) 75 MG tablet Take 1 tablet (75 mg total) by mouth daily.  . hydrALAZINE (APRESOLINE) 50 MG tablet TAKE 1 TABLET BY MOUTH EVERY 8 HOURS.  . HYDROcodone-acetaminophen (NORCO) 7.5-325 MG tablet Take 1  tablet by mouth every 6 (six) hours as needed (pain).  Marland Kitchen ipratropium-albuterol (DUONEB) 0.5-2.5 (3) MG/3ML SOLN Take 3 mLs by nebulization every 6 (six) hours as needed.  . lidocaine (LIDODERM) 5 % Place 1 patch onto the skin daily. Remove & Discard patch within 12 hours or as directed by MD  . lidocaine-prilocaine (EMLA) cream Apply 1 application topically as needed (prior to treatment).   . losartan (COZAAR) 50 MG tablet Take 50 mg by mouth daily.  . ondansetron (ZOFRAN) 4 MG tablet Take 4 mg by mouth every 8 (eight) hours as needed.  Marland Kitchen oxybutynin (DITROPAN XL) 15 MG 24 hr tablet Take 15 mg by mouth daily.  . pantoprazole (PROTONIX) 40 MG tablet Take 1 tablet (40 mg total) by mouth daily.  . pravastatin (PRAVACHOL) 40 MG tablet Take 40 mg by mouth at bedtime.   Marland Kitchen scopolamine (TRANSDERM-SCOP) 1 MG/3DAYS Place 1 patch onto the skin every 3 (three) days.  Marland Kitchen topiramate (TOPAMAX) 25 MG tablet Take 1 tablet (25 mg total) by mouth as needed (for headaches).   No facility-administered encounter  medications on file as of 01/14/2020.     Darcia Lampi Jenetta Downer, NP

## 2020-02-24 ENCOUNTER — Telehealth: Payer: Self-pay | Admitting: Adult Health Nurse Practitioner

## 2020-02-24 NOTE — Telephone Encounter (Signed)
Called to schedule follow up visit.  Left VM with reason for call and call back info Trameka Dorough K. Olena Heckle NP

## 2020-03-07 ENCOUNTER — Other Ambulatory Visit: Payer: Self-pay | Admitting: Cardiovascular Disease

## 2020-03-08 ENCOUNTER — Telehealth: Payer: Self-pay

## 2020-03-08 NOTE — Telephone Encounter (Signed)
TC placed to patient to check in and offer to schedule a follow up visit. VM left

## 2020-03-29 ENCOUNTER — Encounter: Payer: Self-pay | Admitting: Radiology

## 2020-03-29 ENCOUNTER — Emergency Department: Payer: Medicare Other

## 2020-03-29 ENCOUNTER — Inpatient Hospital Stay
Admission: EM | Admit: 2020-03-29 | Discharge: 2020-04-02 | DRG: 291 | Disposition: A | Discharge status: Home Health | Payer: Medicare Other | Attending: Internal Medicine | Admitting: Internal Medicine

## 2020-03-29 DIAGNOSIS — D631 Anemia in chronic kidney disease: Secondary | ICD-10-CM | POA: Diagnosis present

## 2020-03-29 DIAGNOSIS — I5033 Acute on chronic diastolic (congestive) heart failure: Secondary | ICD-10-CM | POA: Diagnosis present

## 2020-03-29 DIAGNOSIS — J449 Chronic obstructive pulmonary disease, unspecified: Secondary | ICD-10-CM | POA: Diagnosis present

## 2020-03-29 DIAGNOSIS — N2581 Secondary hyperparathyroidism of renal origin: Secondary | ICD-10-CM | POA: Diagnosis present

## 2020-03-29 DIAGNOSIS — I132 Hypertensive heart and chronic kidney disease with heart failure and with stage 5 chronic kidney disease, or end stage renal disease: Principal | ICD-10-CM | POA: Diagnosis present

## 2020-03-29 DIAGNOSIS — Z7902 Long term (current) use of antithrombotics/antiplatelets: Secondary | ICD-10-CM

## 2020-03-29 DIAGNOSIS — Z7982 Long term (current) use of aspirin: Secondary | ICD-10-CM | POA: Diagnosis not present

## 2020-03-29 DIAGNOSIS — E1122 Type 2 diabetes mellitus with diabetic chronic kidney disease: Secondary | ICD-10-CM | POA: Diagnosis present

## 2020-03-29 DIAGNOSIS — Z20822 Contact with and (suspected) exposure to covid-19: Secondary | ICD-10-CM | POA: Diagnosis present

## 2020-03-29 DIAGNOSIS — J810 Acute pulmonary edema: Secondary | ICD-10-CM

## 2020-03-29 DIAGNOSIS — Z992 Dependence on renal dialysis: Secondary | ICD-10-CM

## 2020-03-29 DIAGNOSIS — E1169 Type 2 diabetes mellitus with other specified complication: Secondary | ICD-10-CM | POA: Diagnosis present

## 2020-03-29 DIAGNOSIS — Z882 Allergy status to sulfonamides status: Secondary | ICD-10-CM | POA: Diagnosis not present

## 2020-03-29 DIAGNOSIS — Z8249 Family history of ischemic heart disease and other diseases of the circulatory system: Secondary | ICD-10-CM | POA: Diagnosis not present

## 2020-03-29 DIAGNOSIS — M109 Gout, unspecified: Secondary | ICD-10-CM | POA: Diagnosis present

## 2020-03-29 DIAGNOSIS — Z885 Allergy status to narcotic agent status: Secondary | ICD-10-CM

## 2020-03-29 DIAGNOSIS — E785 Hyperlipidemia, unspecified: Secondary | ICD-10-CM | POA: Diagnosis present

## 2020-03-29 DIAGNOSIS — N186 End stage renal disease: Secondary | ICD-10-CM | POA: Diagnosis present

## 2020-03-29 DIAGNOSIS — Z888 Allergy status to other drugs, medicaments and biological substances status: Secondary | ICD-10-CM | POA: Diagnosis not present

## 2020-03-29 DIAGNOSIS — J9621 Acute and chronic respiratory failure with hypoxia: Secondary | ICD-10-CM | POA: Diagnosis present

## 2020-03-29 DIAGNOSIS — Z79890 Hormone replacement therapy (postmenopausal): Secondary | ICD-10-CM

## 2020-03-29 DIAGNOSIS — I350 Nonrheumatic aortic (valve) stenosis: Secondary | ICD-10-CM | POA: Diagnosis present

## 2020-03-29 DIAGNOSIS — Z83438 Family history of other disorder of lipoprotein metabolism and other lipidemia: Secondary | ICD-10-CM

## 2020-03-29 DIAGNOSIS — F419 Anxiety disorder, unspecified: Secondary | ICD-10-CM | POA: Diagnosis present

## 2020-03-29 DIAGNOSIS — Z881 Allergy status to other antibiotic agents status: Secondary | ICD-10-CM

## 2020-03-29 DIAGNOSIS — I10 Essential (primary) hypertension: Secondary | ICD-10-CM | POA: Diagnosis present

## 2020-03-29 DIAGNOSIS — D696 Thrombocytopenia, unspecified: Secondary | ICD-10-CM | POA: Diagnosis not present

## 2020-03-29 DIAGNOSIS — Z79899 Other long term (current) drug therapy: Secondary | ICD-10-CM | POA: Diagnosis not present

## 2020-03-29 DIAGNOSIS — R0602 Shortness of breath: Principal | ICD-10-CM

## 2020-03-29 LAB — CBC WITH DIFFERENTIAL/PLATELET
Abs Immature Granulocytes: 0.07 10*3/uL (ref 0.00–0.07)
Basophils Absolute: 0.1 10*3/uL (ref 0.0–0.1)
Basophils Relative: 1 %
Eosinophils Absolute: 0.5 10*3/uL (ref 0.0–0.5)
Eosinophils Relative: 6 %
HCT: 29.7 % — ABNORMAL LOW (ref 36.0–46.0)
Hemoglobin: 9.8 g/dL — ABNORMAL LOW (ref 12.0–15.0)
Immature Granulocytes: 1 %
Lymphocytes Relative: 10 %
Lymphs Abs: 0.9 10*3/uL (ref 0.7–4.0)
MCH: 29.3 pg (ref 26.0–34.0)
MCHC: 33 g/dL (ref 30.0–36.0)
MCV: 88.9 fL (ref 80.0–100.0)
Monocytes Absolute: 0.6 10*3/uL (ref 0.1–1.0)
Monocytes Relative: 7 %
Neutro Abs: 6.7 10*3/uL (ref 1.7–7.7)
Neutrophils Relative %: 75 %
Platelets: 177 10*3/uL (ref 150–400)
RBC: 3.34 MIL/uL — ABNORMAL LOW (ref 3.87–5.11)
RDW: 14.9 % (ref 11.5–15.5)
WBC: 8.7 10*3/uL (ref 4.0–10.5)
nRBC: 0 % (ref 0.0–0.2)

## 2020-03-29 LAB — BLOOD GAS, VENOUS
Acid-base deficit: 1.7 mmol/L (ref 0.0–2.0)
Bicarbonate: 24.3 mmol/L (ref 20.0–28.0)
O2 Saturation: 93.2 %
Patient temperature: 37
pCO2, Ven: 45 mmHg (ref 44.0–60.0)
pH, Ven: 7.34 (ref 7.250–7.430)
pO2, Ven: 72 mmHg — ABNORMAL HIGH (ref 32.0–45.0)

## 2020-03-29 LAB — TROPONIN I (HIGH SENSITIVITY): Troponin I (High Sensitivity): 33 ng/L — ABNORMAL HIGH (ref <18)

## 2020-03-29 LAB — BASIC METABOLIC PANEL
Anion gap: 16 — ABNORMAL HIGH (ref 5–15)
BUN: 74 mg/dL — ABNORMAL HIGH (ref 8–23)
CO2: 24 mmol/L (ref 22–32)
Calcium: 8 mg/dL — ABNORMAL LOW (ref 8.9–10.3)
Chloride: 100 mmol/L (ref 98–111)
Creatinine, Ser: 7.45 mg/dL — ABNORMAL HIGH (ref 0.44–1.00)
GFR calc Af Amer: 6 mL/min — ABNORMAL LOW (ref >60)
GFR calc non Af Amer: 5 mL/min — ABNORMAL LOW (ref >60)
Glucose, Bld: 186 mg/dL — ABNORMAL HIGH (ref 70–99)
Potassium: 5 mmol/L (ref 3.5–5.1)
Sodium: 140 mmol/L (ref 135–145)

## 2020-03-29 LAB — SARS CORONAVIRUS 2 BY RT PCR (HOSPITAL ORDER, PERFORMED IN ~~LOC~~ HOSPITAL LAB): SARS Coronavirus 2: NEGATIVE

## 2020-03-29 LAB — BRAIN NATRIURETIC PEPTIDE: B Natriuretic Peptide: 1226 pg/mL — ABNORMAL HIGH (ref 0.0–100.0)

## 2020-03-29 MED ORDER — FUROSEMIDE 10 MG/ML IJ SOLN
80.0000 mg | Freq: Once | INTRAMUSCULAR | Status: AC
  Administered 2020-03-29: 23:00:00 80 mg via INTRAVENOUS
  Filled 2020-03-29: qty 8

## 2020-03-29 MED ORDER — FUROSEMIDE 10 MG/ML IJ SOLN
INTRAMUSCULAR | Status: AC
  Filled 2020-03-29: qty 4

## 2020-03-29 MED ORDER — FUROSEMIDE 10 MG/ML IJ SOLN: 40.0000 mg | Freq: Once | INTRAMUSCULAR | Status: DC

## 2020-03-29 NOTE — ED Notes (Signed)
Report received from [facility]. Patient care assumed. Patient/RN introduction complete. Will continue to monitor. Pt resting comfortably, no co pain or shob at this time. Pt awaiting admission.

## 2020-03-29 NOTE — ED Triage Notes (Signed)
[facility] from home for respiratory distress with hypoxia. Initial ra sat on EMS arrival in low 80's. EMS reports pt was unresponsive on scene. Pt arrived to ED on cpap and with O2 sat of 94. Pt has dialysis T, TH, and Sat.

## 2020-03-29 NOTE — ED Notes (Signed)
Hinton Dyer (caretaker) updated on status.

## 2020-03-30 DIAGNOSIS — J9621 Acute and chronic respiratory failure with hypoxia: Secondary | ICD-10-CM

## 2020-03-30 DIAGNOSIS — N186 End stage renal disease: Secondary | ICD-10-CM

## 2020-03-30 DIAGNOSIS — Z992 Dependence on renal dialysis: Secondary | ICD-10-CM

## 2020-03-30 DIAGNOSIS — I5033 Acute on chronic diastolic (congestive) heart failure: Secondary | ICD-10-CM

## 2020-03-30 LAB — CBC WITH DIFFERENTIAL/PLATELET
Abs Immature Granulocytes: 0.02 10*3/uL (ref 0.00–0.07)
Basophils Absolute: 0 10*3/uL (ref 0.0–0.1)
Basophils Relative: 0 %
Eosinophils Absolute: 0.2 10*3/uL (ref 0.0–0.5)
Eosinophils Relative: 4 %
HCT: 25.3 % — ABNORMAL LOW (ref 36.0–46.0)
Hemoglobin: 8.4 g/dL — ABNORMAL LOW (ref 12.0–15.0)
Immature Granulocytes: 0 %
Lymphocytes Relative: 13 %
Lymphs Abs: 0.6 10*3/uL — ABNORMAL LOW (ref 0.7–4.0)
MCH: 29.2 pg (ref 26.0–34.0)
MCHC: 33.2 g/dL (ref 30.0–36.0)
MCV: 87.8 fL (ref 80.0–100.0)
Monocytes Absolute: 0.3 10*3/uL (ref 0.1–1.0)
Monocytes Relative: 5 %
Neutro Abs: 3.8 10*3/uL (ref 1.7–7.7)
Neutrophils Relative %: 78 %
Platelets: 133 10*3/uL — ABNORMAL LOW (ref 150–400)
RBC: 2.88 MIL/uL — ABNORMAL LOW (ref 3.87–5.11)
RDW: 14.8 % (ref 11.5–15.5)
WBC: 5 10*3/uL (ref 4.0–10.5)
nRBC: 0 % (ref 0.0–0.2)

## 2020-03-30 LAB — HEMOGLOBIN A1C
Hgb A1c MFr Bld: 5.1 % (ref 4.8–5.6)
Mean Plasma Glucose: 99.67 mg/dL

## 2020-03-30 LAB — GLUCOSE, CAPILLARY
Glucose-Capillary: 144 mg/dL — ABNORMAL HIGH (ref 70–99)
Glucose-Capillary: 86 mg/dL (ref 70–99)
Glucose-Capillary: 141 mg/dL — ABNORMAL HIGH (ref 70–99)
Glucose-Capillary: 85 mg/dL (ref 70–99)

## 2020-03-30 LAB — PHOSPHORUS: Phosphorus: 3 mg/dL (ref 2.5–4.6)

## 2020-03-30 LAB — TROPONIN I (HIGH SENSITIVITY): Troponin I (High Sensitivity): 49 ng/L — ABNORMAL HIGH (ref <18)

## 2020-03-30 MED ORDER — MIRTAZAPINE 15 MG PO TABS
15.0000 mg | ORAL_TABLET | Freq: Every day | ORAL | Status: DC
  Administered 2020-03-30 – 2020-04-01 (×3): 15 mg via ORAL
  Filled 2020-03-30 (×4): qty 1

## 2020-03-30 MED ORDER — LIDOCAINE-PRILOCAINE 2.5-2.5 % EX CREA
1.0000 "application " | TOPICAL_CREAM | CUTANEOUS | Status: DC | PRN
  Filled 2020-03-30: qty 5

## 2020-03-30 MED ORDER — PRAVASTATIN SODIUM 20 MG PO TABS
40.0000 mg | ORAL_TABLET | Freq: Every day | ORAL | Status: DC
  Administered 2020-03-30 – 2020-04-01 (×3): 40 mg via ORAL
  Filled 2020-03-30: qty 1
  Filled 2020-03-30 (×3): qty 2

## 2020-03-30 MED ORDER — ALTEPLASE 2 MG IJ SOLR: 2.0000 mg | Freq: Once | INTRAMUSCULAR | Status: DC | PRN

## 2020-03-30 MED ORDER — LOSARTAN POTASSIUM 50 MG PO TABS
50.0000 mg | ORAL_TABLET | Freq: Every day | ORAL | Status: DC
  Administered 2020-03-30 – 2020-04-02 (×4): 50 mg via ORAL
  Filled 2020-03-30 (×4): qty 1

## 2020-03-30 MED ORDER — SODIUM CHLORIDE 0.9% FLUSH: 3.0000 mL | INTRAVENOUS | Status: DC | PRN

## 2020-03-30 MED ORDER — ONDANSETRON HCL 4 MG/2ML IJ SOLN: 4.0000 mg | Freq: Four times a day (QID) | INTRAMUSCULAR | Status: DC | PRN

## 2020-03-30 MED ORDER — ACETAMINOPHEN 500 MG PO TABS: 500.0000 mg | ORAL_TABLET | Freq: Four times a day (QID) | ORAL | Status: DC | PRN

## 2020-03-30 MED ORDER — SODIUM CHLORIDE 0.9 % IV SOLN: 100.0000 mL | INTRAVENOUS | Status: DC | PRN

## 2020-03-30 MED ORDER — SODIUM CHLORIDE 0.9% FLUSH
3.0000 mL | Freq: Two times a day (BID) | INTRAVENOUS | Status: DC
  Administered 2020-03-30 – 2020-04-02 (×8): 3 mL via INTRAVENOUS

## 2020-03-30 MED ORDER — TOPIRAMATE 25 MG PO TABS
25.0000 mg | ORAL_TABLET | ORAL | Status: DC | PRN
  Filled 2020-03-30: qty 1

## 2020-03-30 MED ORDER — LIDOCAINE HCL (PF) 1 % IJ SOLN
5.0000 mL | INTRAMUSCULAR | Status: DC | PRN
  Filled 2020-03-30: qty 5

## 2020-03-30 MED ORDER — INSULIN ASPART 100 UNIT/ML ~~LOC~~ SOLN
0.0000 [IU] | Freq: Every day | SUBCUTANEOUS | Status: DC
  Administered 2020-03-30: 0 [IU] via SUBCUTANEOUS

## 2020-03-30 MED ORDER — CLOPIDOGREL BISULFATE 75 MG PO TABS
75.0000 mg | ORAL_TABLET | Freq: Every day | ORAL | Status: DC
  Administered 2020-03-30 – 2020-04-02 (×4): 75 mg via ORAL
  Filled 2020-03-30 (×4): qty 1

## 2020-03-30 MED ORDER — CHLORHEXIDINE GLUCONATE CLOTH 2 % EX PADS
6.0000 | MEDICATED_PAD | Freq: Every day | CUTANEOUS | Status: DC
  Filled 2020-03-30 (×2): qty 6

## 2020-03-30 MED ORDER — PENTAFLUOROPROP-TETRAFLUOROETH EX AERO
1.0000 "application " | INHALATION_SPRAY | CUTANEOUS | Status: DC | PRN
  Filled 2020-03-30: qty 30

## 2020-03-30 MED ORDER — ASPIRIN 81 MG PO CHEW
81.0000 mg | CHEWABLE_TABLET | Freq: Every day | ORAL | Status: DC
  Administered 2020-03-30 – 2020-04-02 (×4): 81 mg via ORAL
  Filled 2020-03-30 (×4): qty 1

## 2020-03-30 MED ORDER — POLYSACCHARIDE IRON COMPLEX 150 MG PO CAPS
150.0000 mg | ORAL_CAPSULE | Freq: Every day | ORAL | Status: DC
  Administered 2020-03-30 – 2020-04-02 (×4): 150 mg via ORAL
  Filled 2020-03-30 (×4): qty 1

## 2020-03-30 MED ORDER — LIDOCAINE 5 % EX PTCH
1.0000 | MEDICATED_PATCH | CUTANEOUS | Status: DC
  Administered 2020-03-30 – 2020-04-02 (×4): 1 via TRANSDERMAL
  Filled 2020-03-30 (×4): qty 1

## 2020-03-30 MED ORDER — FUROSEMIDE 10 MG/ML IJ SOLN
80.0000 mg | Freq: Two times a day (BID) | INTRAMUSCULAR | Status: DC
  Administered 2020-03-30 – 2020-04-02 (×6): 80 mg via INTRAVENOUS
  Filled 2020-03-30 (×7): qty 8

## 2020-03-30 MED ORDER — EPOETIN ALFA 10000 UNIT/ML IJ SOLN
4000.0000 [IU] | INTRAMUSCULAR | Status: DC
  Administered 2020-04-01: 4000 [IU] via INTRAVENOUS
  Filled 2020-03-30: qty 1

## 2020-03-30 MED ORDER — ALBUTEROL SULFATE HFA 108 (90 BASE) MCG/ACT IN AERS
2.0000 | INHALATION_SPRAY | Freq: Four times a day (QID) | RESPIRATORY_TRACT | Status: DC | PRN
  Filled 2020-03-30: qty 6.7

## 2020-03-30 MED ORDER — INSULIN ASPART 100 UNIT/ML ~~LOC~~ SOLN: 0.0000 [IU] | Freq: Three times a day (TID) | SUBCUTANEOUS | Status: DC

## 2020-03-30 MED ORDER — LORATADINE 10 MG PO TABS
10.0000 mg | ORAL_TABLET | Freq: Every day | ORAL | Status: DC
  Administered 2020-03-30 – 2020-04-02 (×4): 10 mg via ORAL
  Filled 2020-03-30 (×4): qty 1

## 2020-03-30 MED ORDER — HYDROCODONE-ACETAMINOPHEN 7.5-325 MG PO TABS
1.0000 | ORAL_TABLET | Freq: Four times a day (QID) | ORAL | Status: DC | PRN
  Administered 2020-03-30 – 2020-04-02 (×8): 1 via ORAL
  Filled 2020-03-30 (×8): qty 1

## 2020-03-30 MED ORDER — AMLODIPINE BESYLATE 10 MG PO TABS
10.0000 mg | ORAL_TABLET | Freq: Every day | ORAL | Status: DC
  Administered 2020-03-31 – 2020-04-02 (×3): 10 mg via ORAL
  Filled 2020-03-30: qty 2
  Filled 2020-03-30 (×3): qty 1

## 2020-03-30 MED ORDER — HEPARIN SODIUM (PORCINE) 1000 UNIT/ML DIALYSIS: 1000.0000 [IU] | INTRAMUSCULAR | Status: DC | PRN

## 2020-03-30 MED ORDER — SODIUM CHLORIDE 0.9 % IV SOLN: 250.0000 mL | INTRAVENOUS | Status: DC | PRN

## 2020-03-30 MED ORDER — CALCIUM ACETATE (PHOS BINDER) 667 MG PO CAPS
1334.0000 mg | ORAL_CAPSULE | Freq: Three times a day (TID) | ORAL | Status: DC
  Administered 2020-03-30 – 2020-04-02 (×10): 1334 mg via ORAL
  Filled 2020-03-30 (×14): qty 2

## 2020-03-30 MED ORDER — HEPARIN SODIUM (PORCINE) 5000 UNIT/ML IJ SOLN
5000.0000 [IU] | Freq: Three times a day (TID) | INTRAMUSCULAR | Status: DC
  Administered 2020-03-30 – 2020-03-31 (×4): 5000 [IU] via SUBCUTANEOUS
  Filled 2020-03-30 (×4): qty 1

## 2020-03-30 MED ORDER — ACETAMINOPHEN 325 MG PO TABS: 650.0000 mg | ORAL_TABLET | ORAL | Status: DC | PRN

## 2020-03-30 MED ORDER — CARVEDILOL 12.5 MG PO TABS
12.5000 mg | ORAL_TABLET | Freq: Two times a day (BID) | ORAL | Status: DC
  Administered 2020-03-30 – 2020-04-02 (×5): 12.5 mg via ORAL
  Filled 2020-03-30 (×5): qty 1
  Filled 2020-03-30: qty 2
  Filled 2020-03-30: qty 1

## 2020-03-30 MED ORDER — SERTRALINE HCL 50 MG PO TABS
50.0000 mg | ORAL_TABLET | Freq: Every day | ORAL | Status: DC
  Administered 2020-03-30 – 2020-04-01 (×3): 50 mg via ORAL
  Filled 2020-03-30 (×4): qty 1

## 2020-03-30 MED ORDER — IPRATROPIUM-ALBUTEROL 0.5-2.5 (3) MG/3ML IN SOLN
3.0000 mL | Freq: Four times a day (QID) | RESPIRATORY_TRACT | Status: DC | PRN
  Administered 2020-03-31: 3 mL via RESPIRATORY_TRACT
  Filled 2020-03-30: qty 3

## 2020-03-30 MED ORDER — HYDRALAZINE HCL 50 MG PO TABS
50.0000 mg | ORAL_TABLET | Freq: Three times a day (TID) | ORAL | Status: DC
  Administered 2020-03-31 – 2020-04-01 (×5): 50 mg via ORAL
  Filled 2020-03-30 (×6): qty 1

## 2020-03-30 MED ORDER — OXYBUTYNIN CHLORIDE ER 5 MG PO TB24
15.0000 mg | ORAL_TABLET | Freq: Every day | ORAL | Status: DC
  Administered 2020-03-30 – 2020-04-02 (×4): 15 mg via ORAL
  Filled 2020-03-30 (×5): qty 1

## 2020-03-30 NOTE — Progress Notes (Signed)
[facility]  ROUNDING NOTE   Subjective:  Patient very well-known to [place]. Came in with significant shortness of breath and respiratory distress. Also had elevated blood pressure. States that she did have a piece of concrete him to eat on Sunday. Patient known to have very narrow window of euvolemia given known aortic stenosis.   Objective:  Vital signs in last 24 hours:  Pulse Rate:  [59-110] 64 (05/11 1345) Resp:  [10-30] 14 (05/11 1345) BP: (96-155)/(38-65) 129/50 (05/11 1345) SpO2:  [84 %-100 %] 100 % (05/11 1100)  Weight change:  There were no vitals filed for this visit.  Intake/Output: No intake/output data recorded.   Intake/Output this shift:  No intake/output data recorded.  Physical Exam: General: No acute distress  Head: Normocephalic, atraumatic. Moist oral mucosal membranes  Eyes: Anicteric  Neck: Supple, trachea midline  Lungs:  Clear to auscultation, normal effort  Heart: S1S2 2/6 SEM  Abdomen:  Soft, nontender, bowel sounds present  Extremities: Trace peripheral edema.  Neurologic: Awake, alert, following commands  Skin: No lesions  Access: Left upper extremity AV fistula    Basic Metabolic Panel: Recent Labs  Lab 03/29/20 2214  NA 140  K 5.0  CL 100  CO2 24  GLUCOSE 186*  BUN 74*  CREATININE 7.45*  CALCIUM 8.0*    Liver Function Tests: No results for input(s): AST, ALT, ALKPHOS, BILITOT, PROT, ALBUMIN in the last 168 hours. No results for input(s): LIPASE, AMYLASE in the last 168 hours. No results for input(s): AMMONIA in the last 168 hours.  CBC: Recent Labs  Lab 03/29/20 2214 03/30/20 1110  WBC 8.7 5.0  NEUTROABS 6.7 3.8  HGB 9.8* 8.4*  HCT 29.7* 25.3*  MCV 88.9 87.8  PLT 177 133*    Cardiac Enzymes: No results for input(s): CKTOTAL, CKMB, CKMBINDEX, TROPONINI in the last 168 hours.  BNP: Invalid input(s): POCBNP  CBG: Recent Labs  Lab 03/30/20 0124 03/30/20 0947  GLUCAP 144* 86     Microbiology: Results for orders placed or performed during the hospital encounter of 03/29/20  SARS Coronavirus 2 by RT PCR (hospital order, performed in [facility] hospital lab) Nasopharyngeal Nasopharyngeal Swab     Status: None   Collection Time: 03/29/20 10:15 PM   Specimen: Nasopharyngeal Swab  Result Value Ref Range Status   SARS Coronavirus 2 NEGATIVE NEGATIVE Final    Comment: (NOTE) SARS-CoV-2 target nucleic acids are NOT DETECTED. The SARS-CoV-2 RNA is generally detectable in upper and lower respiratory specimens during the acute phase of infection. The lowest concentration of SARS-CoV-2 viral copies this assay can detect is 250 copies / mL. A negative result does not preclude SARS-CoV-2 infection and should not be used as the sole basis for treatment or other patient management decisions.  A negative result may occur with improper specimen collection / handling, submission of specimen other than nasopharyngeal swab, presence of viral mutation(s) within the areas targeted by this assay, and inadequate number of viral copies (<250 copies / mL). A negative result must be combined with clinical observations, patient history, and epidemiological information. Fact Sheet for Patients:   StrictlyIdeas.no Fact Sheet for Healthcare Providers: BankingDealers.co.za This test is not yet approved or cleared  by the [place] FDA and has been authorized for detection and/or diagnosis of SARS-CoV-2 by FDA under an Emergency Use Authorization (EUA).  This EUA will remain in effect (meaning this test can be used) for the duration of the COVID-19 declaration under Section 564(b)(1) of the Act,  21 U.S.C. section 360bbb-3(b)(1), unless the authorization is terminated or revoked sooner. Performed at [facility], [address] [place], [place] [address]     Coagulation Studies: No results for input(s): LABPROT, INR in the  last 72 hours.  Urinalysis: No results for input(s): COLORURINE, LABSPEC, PHURINE, GLUCOSEU, HGBUR, BILIRUBINUR, KETONESUR, PROTEINUR, UROBILINOGEN, NITRITE, LEUKOCYTESUR in the last 72 hours.  Invalid input(s): APPERANCEUR    Imaging: DG Chest Portable 1 View  Result Date: 03/29/2020 CLINICAL DATA:  Shortness of breath EXAM: PORTABLE CHEST 1 VIEW COMPARISON:  12/04/2019 FINDINGS: Cardiac shadow is mildly enlarged. Aortic calcifications are seen. Bilateral airspace opacities are noted increased from the prior exam likely representing some acute on chronic multifocal pneumonia. Possible mild pulmonary edema could not be totally excluded. No sizable effusion is seen. IMPRESSION: Increased parenchymal opacities bilaterally. This may represent acute on chronic infiltrate or possibly some acute pulmonary edema superimposed over more chronic pattern. Electronically Signed   By: Inez Catalina M.D.   On: 03/29/2020 22:37     Medications:   . sodium chloride     . amLODipine  10 mg Oral Daily  . aspirin  81 mg Oral Daily  . calcium acetate  1,334 mg Oral TID WC  . carvedilol  12.5 mg Oral BID WC  . Chlorhexidine Gluconate Cloth  6 each Topical Q0600  . clopidogrel  75 mg Oral Daily  . furosemide  80 mg Intravenous BID  . heparin  5,000 Units Subcutaneous Q8H  . hydrALAZINE  50 mg Oral Q8H  . insulin aspart  0-5 Units Subcutaneous QHS  . insulin aspart  0-6 Units Subcutaneous TID WC  . iron polysaccharides  150 mg Oral Daily  . lidocaine  1 patch Transdermal Q24H  . loratadine  10 mg Oral Daily  . losartan  50 mg Oral Daily  . mirtazapine  15 mg Oral QHS  . oxybutynin  15 mg Oral Daily  . pravastatin  40 mg Oral q1800  . sertraline  50 mg Oral QHS  . sodium chloride flush  3 mL Intravenous Q12H   sodium chloride, acetaminophen, albuterol, HYDROcodone-acetaminophen, ipratropium-albuterol, lidocaine-prilocaine, ondansetron (ZOFRAN) IV, sodium chloride flush, topiramate  Assessment/ Plan:   74 y.o. female with past medical history of ESRD on HD TTS, aortic stenosis, hypertension, COPD, diabetes mellitus type 2, overactive bladder, gout, GERD, chronic costochondritis admitted with shortness of breath, elevated blood pressure.  [facility]/TTS/[facility] [address]/left upper extremity AV fistula/57 kg  1.  ESRD on HD TTS.  Patient due for dialysis treatment today.  Orders have been prepared.  Reevaluate respiratory status after ultrafiltration.  Suspect increased salt ingestion responsible for acute shortness of breath this admission.  2.  Anemia of chronic kidney disease.  Hemoglobin dropped to 8.4.  Start the patient on Epogen 4000 units IV with dialysis treatments.  3.  Secondary hyperparathyroidism.  Monitor bone mineral metabolism parameters over the course of hospitalization.  4.  Hypertension.  Maintain the patient on amlodipine, carvedilol, hydralazine, and losartan.     LOS: 1 Estanislao Harmon 5/11/20211:50 PM

## 2020-03-30 NOTE — TOC Initial Note (Signed)
Transition of Care [facility]) - Initial/Assessment Note    Patient Details  Name: Phyllis [name] MRN: [id number] Date of Birth: 05-29-1946  [facility]) CM/SW Contact:    Anselm Pancoast, RN Phone Number: 03/30/2020, 9:29 AM  Clinical Narrative:                 Spoke with friend, [name] who states patient has paid caregivers 24/7 who assist with ADL's, transport and all cooking/cleaning. Patient has HD 3xweek and caregivers provide transportation. Patient was in [facility] several months ago and discharged home with the caregivers which [name] states has been a Microbiologist. [name] states patient had Physical therapy coming to the home every Wednesday but is not sure if this is current. TOC will follow up with patient once medically stable and able to talk.   Expected Discharge Plan: [place] Barriers to Discharge: Continued Medical Work up   Patient Goals and CMS Choice Patient states their goals for this hospitalization and ongoing recovery are:: Get back home and feel better      Expected Discharge Plan and Services Expected Discharge Plan: [place]       Living arrangements for the past 2 months: Single Family Home                                      Prior Living Arrangements/Services Living arrangements for the past 2 months: Single Family Home Lives with:: Other (Comment)(paid caregivers 24/7) Patient language and need for interpreter reviewed:: Yes Do you feel safe going back to the place where you live?: Yes      Need for Family Participation in Patient Care: Yes (Comment) Care giver support system in place?: Yes (comment)   Criminal Activity/Legal Involvement Pertinent to Current Situation/Hospitalization: No - Comment as needed  Activities of Daily Living      Permission Sought/Granted Permission sought to share information with : Facility Sport and exercise psychologist, Case Optician, dispensing granted to share  information with : Yes, Verbal Permission Granted  Share Information with NAME: TOC Department           Emotional Assessment Appearance:: Appears stated age Attitude/Demeanor/Rapport: Unable to Assess Affect (typically observed): Unable to Assess Orientation: : Oriented to Self, Oriented to Place, Oriented to  Time, Oriented to Situation   Psych Involvement: No (comment)  Admission diagnosis:  Acute on chronic respiratory failure with hypoxemia ([place]) [J96.21] Patient Active Problem List   Diagnosis Date Noted  . Acute on chronic respiratory failure with hypoxemia ([place]) 03/29/2020  . Asymptomatic bacteriuria 12/05/2019  . Acute respiratory failure ([place]) 10/23/2019  . COPD with acute exacerbation ([place]) 10/23/2019  . Elevated troponin 10/23/2019  . CAD (coronary artery disease) 10/23/2019  . COPD exacerbation ([place]) 10/23/2019  . Leukocytosis 10/23/2019  . Anxiety 10/23/2019  . Palliative care by specialist   . Goals of care, counseling/discussion   . Flash pulmonary edema ([place]) 10/14/2019  . Respiratory failure with hypoxia ([place]) 10/11/2019  . 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-stage renal disease) ([place])   . Acute on chronic respiratory failure with hypoxia ([place]) 10/01/2019  . Severe aortic valve stenosis   . Acute respiratory failure with hypoxemia ([place]) 09/20/2019  . COPD with acute bronchitis ([place]) 09/05/2019  . Congestive heart failure ([place]) 05/24/2019  . Hyperkalemia 11/29/2018  . Chronic diastolic heart failure ([place]) 11/02/2018  . HTN (hypertension) 11/02/2018  .  Uncontrolled hypertension 10/21/2018  . History of acute pulmonary edema 10/21/2018  . Pressure injury of skin 03/16/2018  . CVA (cerebral vascular accident) ([place]) 01/16/2018  . Aortic atherosclerosis ([place]) 12/11/2017  . Chest pain 09/18/2017  . Hyperlipidemia 08/06/2015  . Complication from renal dialysis device 04/12/2015  . SOB (shortness of breath) 02/01/2015  . ESRD (end stage renal disease) on dialysis ([place])  02/01/2015  . Type 2 diabetes mellitus with other specified complication ([place]) 92/11/69  . Asthma 02/01/2015  . Acute on chronic diastolic CHF (congestive heart failure) ([place]) 02/01/2015   PCP:  [name], MD Pharmacy:   [facility] [place] [facility] [address] [place] [address] Phone: [phone] Fax: [place], [place] [address] [place] [address] Phone: [phone] Fax: [phone]  [id number] - , [place] - [address] [place] [address] Phone: [phone] Fax: [phone]     Social Determinants of Health (SDOH) Interventions    Readmission Risk Interventions Readmission Risk Prevention Plan 10/13/2019 09/26/2019 05/07/2019  Transportation Screening - Complete Complete  Medication Review Press photographer) - Complete Complete  PCP or Specialist appointment within 3-5 days of discharge Complete Complete Complete  HRI or [place] (No Data) Complete Complete  SW Recovery Care/Counseling Consult Complete Complete -  Palliative Care Screening Not Applicable Not Applicable Not Applicable  Skilled Nursing Facility Complete Not Applicable Not Applicable  Some recent data might be hidden

## 2020-03-30 NOTE — ED Notes (Signed)
No change in condition, pt resting comfortably without distress noted.

## 2020-03-30 NOTE — H&P (Signed)
History and Physical   Phyllis [name] LSL:[id number] DOB: 1946/06/01 DOA: 03/29/2020  Referring MD/NP/PA: Dr. Archie Balboa  PCP: [name], MD   Outpatient Specialists: Dr. Zollie Scale  Patient coming from: Home  Chief Complaint: Shortness of breath  HPI: Phyllis [name] is a 74 y.o. female with medical history significant of end-stage renal disease on hemodialysis on Tuesdays Thursdays and Saturdays, diabetes, COPD, asthma, GERD, morbid obesity, anxiety with depression, diastolic dysfunction CHF and hyperlipidemia with severe aortic stenosis who presented to the ER in acute respiratory failure.  Patient had her dialysis on Saturday and was doing fine.  Today however she suddenly became hypoxic having significant shortness of breath cough and EMS brought patient to the ER.  Patient was found to be in acute respiratory failure with flash pulmonary edema.  Denied any increased fluid intake.  Her blood pressure is elevated but not in the malignant range.  Patient has been placed on BiPAP and nephrology consulted.  She is still making some urine and responded to initial dose of IV Lasix.  She also complained of central chest pain was mainly pressure.  No hemoptysis no cough.  Patient is being admitted to the hospital with acute exacerbation of diastolic CHF probably secondary to flash pulmonary edema..  ED Course: Temperature is 98.5 blood pressure 142/60 pulse 110 respirate of 30 oxygen sat 84% room air currently 99% on BiPAP.  Hemoglobin is 9.8 with normal white count and platelets.  Sodium 140 potassium 5.0 chloride 100 CO2 24 BUN 74 creatinine is 1.45 potassium 8.0.  Venous pH is 7.34.  BNP 1226.  Troponin 33.  COVID-19 screen is negative.  Chest x-ray showed findings suspicious of acute pulmonary edema superimposed on chronic.  Nephrology consulted and recommended IV Lasix and patient will be dialyzed first thing in the morning.  Review of Systems: As per HPI otherwise 10 point review of  systems negative.    Past Medical History:  Diagnosis Date  . (HFpEF) heart failure with preserved ejection fraction ([place])    a. TTE 01/2014: nl LV sys fxn, no valvular abnormalities; b. TTE 11/16: nl EF, mild LVH;  c. 04/2019 Echo: EF 60-65%. DD. Nl RV fxn. Mod AS. 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  . Allergy   . Anemia of chronic disease   . Anxiety   . Aortic atherosclerosis ([place])   . Asthma   . Chronic back pain   . COPD (chronic obstructive pulmonary disease) ([place])   . Diabetes mellitus with complication ([place])   . ESRD on hemodialysis ([place])    a. Tues/Sat; b. 2/2 small kidneys  . Essential hypertension   . Fistula    lower left arm  . GERD (gastroesophageal reflux disease)   . Gout   . History of exercise stress test    a. 01/2014: no evidence of ischemia; b. Lexiscan 08/2015: no sig ischemia, severe GI uptake artifact, low risk; c. CPET @ [facility] 09/2016: exercised 3 min 12 sec on bike without incline, 2.28 METs, VO2 of 8.1, 48% of predicted, indicating mod to sev functional impairment, evidence of blunted HR, stroke volume, and BP augmentation as well as ventilation-perfusion mismatch with exercise  . HLD (hyperlipidemia)   . Non-obstructive Carotid arterial disease ([place])    a. 12/2017: <50% bilat ICA dzs.  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 Severe aortic stenosis    a. by 09/2019 echo b. 04/2019 Echo: Mod AS.  Marland Kitchen Sleep apnea  Past Surgical History:  Procedure Laterality Date  . CARDIAC CATHETERIZATION    . carpel tunnel    . GALLBLADDER SURGERY    . PERIPHERAL VASCULAR CATHETERIZATION N/A 04/12/2015   Procedure: A/V Shuntogram/Fistulagram;  Surgeon: Algernon Huxley, MD;  Location: [place];  Service: Cardiovascular;  Laterality: N/A;  . PERIPHERAL VASCULAR CATHETERIZATION N/A 04/12/2015   Procedure: A/V Shunt Intervention;  Surgeon: Algernon Huxley, MD;  Location: [facility] [facility];  Service: Cardiovascular;  Laterality:  N/A;  . PERIPHERAL VASCULAR CATHETERIZATION N/A 06/09/2015   Procedure: Dialysis/Perma Catheter Removal;  Surgeon: Katha Cabal, MD;  Location: [facility];  Service: Cardiovascular;  Laterality: N/A;  . RIGHT/LEFT HEART CATH AND CORONARY ANGIOGRAPHY N/A 10/14/2019   Procedure: RIGHT/LEFT HEART CATH AND CORONARY ANGIOGRAPHY;  Surgeon: Belva Crome, MD;  Location: [facility];  Service: Cardiovascular;  Laterality: N/A;     reports that she has never smoked. She has never used smokeless tobacco. She reports that she does not drink alcohol or use drugs.  Allergies  Allergen Reactions  . Enalapril Maleate Other (See Comments)    Other reaction(s): Headache  . Nitrofurantoin Swelling and Rash    Other Reaction: swelling of body  . Sulfamethoxazole-Trimethoprim Swelling  . 2,4-D Dimethylamine (Amisol) Rash and Other (See Comments)    Other Reaction: h/a  . Baclofen Other (See Comments) and Nausea Only    lightheadness ,drowsiness , muscle weakness , twitching in hands   . Neosporin [Neomycin-Bacitracin Zn-Polymyx] Other (See Comments) and Rash    Other Reaction: irritation Skin irritation   . Quinine Nausea And Vomiting, Rash and Other (See Comments)    Other Reaction: Vomiting, rash, h/a, vision  . Ultram [Tramadol] Palpitations  . Zocor [Simvastatin] Other (See Comments) and Rash    Other Reaction: muscle spasms Muscle pain and spasms  . Bactrim [Sulfamethoxazole-Trimethoprim] Swelling  . Levodopa Other (See Comments)    Reaction: unknown  . Macrodantin [Nitrofurantoin Macrocrystal] Swelling  . Quinine Derivatives Other (See Comments)    Vertigo,nausea vomiting blurred vision headache ears sensitivity     Family History  Problem Relation Age of Onset  . Hypertension Mother   . Hyperlipidemia Mother   . Heart disease Father   . Heart attack Father 45  . Hypertension Father   . Hyperlipidemia Father   . Heart disease Brother        CABG   . Heart  attack Brother   . Breast cancer Neg Hx      Prior to Admission medications   Medication Sig Start Date End Date Taking? Authorizing Provider  acetaminophen (TYLENOL) 500 MG tablet Take 1 tablet (500 mg total) by mouth every 6 (six) hours as needed for mild pain or fever. 09/29/19  Yes Danford, Suann Larry, MD  albuterol (PROVENTIL HFA;VENTOLIN HFA) 108 (90 Base) MCG/ACT inhaler Inhale 2 puffs into the lungs every 6 (six) hours as needed for wheezing or shortness of breath. Out of medication   Yes [provider]  amLODipine (NORVASC) 10 MG tablet Take 10 mg by mouth daily.  12/29/13  Yes [provider]  ASPIRIN 81 PO Take 1 tablet by mouth daily. 01/27/20  Yes [provider]  calcium acetate (PHOSLO) 667 MG capsule Take 2 capsules by mouth in the morning, at noon, and at bedtime. 12/31/19  Yes [provider]  carvedilol (COREG) 12.5 MG tablet Take 12.5 mg by mouth 2 (two) times daily with a meal.  01/09/14  Yes [provider]  cetirizine (ZYRTEC) 10 MG tablet Take 10 mg by mouth daily as needed for allergies.    Yes [provider]  clopidogrel (PLAVIX) 75 MG tablet Take 1 tablet (75 mg total) by mouth daily. 01/18/18  Yes Epifanio Lesches, MD  [facility] MELATONIN 5 MG CHEW Chew 1 tablet by mouth at bedtime. 02/02/20  Yes [provider]  hydrALAZINE (APRESOLINE) 50 MG tablet TAKE 1 TABLET BY MOUTH EVERY 8 HOURS 03/08/20  Yes Gollan, Kathlene November, MD  HYDROcodone-acetaminophen (NORCO) 7.5-325 MG tablet Take 1 tablet by mouth every 6 (six) hours as needed (pain). 10/08/19  Yes Danford, Suann Larry, MD  iron polysaccharides (NIFEREX) 150 MG capsule Take 150 mg by mouth daily. 01/09/20 01/08/21 Yes [provider]  lidocaine (LIDODERM) 5 % Place 1 patch onto the skin daily. Remove & Discard patch within 12 hours or as directed by MD 10/14/19  Yes Flora Lipps, MD  lidocaine-prilocaine (EMLA) cream Apply 1 application topically as needed  (prior to treatment).    Yes [provider]  losartan (COZAAR) 50 MG tablet Take 50 mg by mouth daily. 03/18/19  Yes [provider]  oxybutynin (DITROPAN XL) 15 MG 24 hr tablet Take 15 mg by mouth daily. 12/24/17  Yes [provider]  pravastatin (PRAVACHOL) 40 MG tablet Take 40 mg by mouth at bedtime.    Yes [provider]  sertraline (ZOLOFT) 50 MG tablet Take 50 mg by mouth at bedtime. 03/02/20  Yes [provider]  ipratropium-albuterol (DUONEB) 0.5-2.5 (3) MG/3ML SOLN Take 3 mLs by nebulization every 6 (six) hours as needed. 10/17/19   [facility], Tami Lin, PA  mirtazapine (REMERON) 15 MG tablet Take 15 mg by mouth at bedtime. 02/25/20   [provider]  topiramate (TOPAMAX) 25 MG tablet Take 1 tablet (25 mg total) by mouth as needed (for headaches). 09/07/19   Henreitta Leber, MD    Physical Exam: Vitals:   03/29/20 2155 03/29/20 2206 03/29/20 2207 03/29/20 2230  BP:   (!) 142/60 (!) 130/57  Pulse:   (!) 110 87  Resp:   (!) 30 (!) 22  SpO2: (!) 84% 97% 97% 99%      Constitutional: Very anxious on BiPAP Vitals:   03/29/20 2155 03/29/20 2206 03/29/20 2207 03/29/20 2230  BP:   (!) 142/60 (!) 130/57  Pulse:   (!) 110 87  Resp:   (!) 30 (!) 22  SpO2: (!) 84% 97% 97% 99%   Eyes: PERRL, lids and conjunctivae normal ENMT: Mucous membranes are moist. Posterior pharynx clear of any exudate or lesions.Normal dentition.  Neck: normal, supple, no masses, no thyromegaly Respiratory: Decreased air entry bilaterally with coarse breath sounds, diffuse bilateral crackles, increased work of breathing and use of accessory muscles..  Cardiovascular: Sinus tachycardia, no murmurs / rubs / gallops. No extremity edema. 2+ pedal pulses. No carotid bruits.  Abdomen: no tenderness, no masses palpated. No hepatosplenomegaly. Bowel sounds positive.  Musculoskeletal: no clubbing / cyanosis. No joint deformity upper and lower extremities. Good ROM, no  contractures. Normal muscle tone.  Skin: no rashes, lesions, ulcers. No induration Neurologic: CN 2-12 grossly intact. Sensation intact, DTR normal. Strength 5/5 in all 4.  Psychiatric: Normal judgment and insight. Alert and oriented x 3.  Anxious mood.     Labs on Admission: I have personally reviewed following labs and imaging studies  CBC: Recent Labs  Lab 03/29/20 2214  WBC 8.7  NEUTROABS 6.7  HGB 9.8*  HCT 29.7*  MCV 88.9  PLT 595   Basic Metabolic Panel: Recent Labs  Lab 03/29/20 2214  NA 140  K 5.0  CL 100  CO2 24  GLUCOSE 186*  BUN 74*  CREATININE 7.45*  CALCIUM 8.0*   GFR: CrCl cannot be calculated (Unknown ideal weight.). Liver Function Tests: No results for input(s): AST, ALT, ALKPHOS, BILITOT, PROT, ALBUMIN in the last 168 hours. No results for input(s): LIPASE, AMYLASE in the last 168 hours. No results for input(s): AMMONIA in the last 168 hours. Coagulation Profile: No results for input(s): INR, PROTIME in the last 168 hours. Cardiac Enzymes: No results for input(s): CKTOTAL, CKMB, CKMBINDEX, TROPONINI in the last 168 hours. BNP (last 3 results) No results for input(s): PROBNP in the last 8760 hours. HbA1C: No results for input(s): HGBA1C in the last 72 hours. CBG: No results for input(s): GLUCAP in the last 168 hours. Lipid Profile: No results for input(s): CHOL, HDL, LDLCALC, TRIG, CHOLHDL, LDLDIRECT in the last 72 hours. Thyroid Function Tests: No results for input(s): TSH, T4TOTAL, FREET4, T3FREE, THYROIDAB in the last 72 hours. Anemia Panel: No results for input(s): VITAMINB12, FOLATE, FERRITIN, TIBC, IRON, RETICCTPCT in the last 72 hours. Urine analysis:    Component Value Date/Time   COLORURINE YELLOW (A) 12/04/2019 2054   APPEARANCEUR CLOUDY (A) 12/04/2019 2054   LABSPEC 1.008 12/04/2019 2054   PHURINE 8.0 12/04/2019 2054   GLUCOSEU NEGATIVE 12/04/2019 2054   HGBUR MODERATE (A) 12/04/2019 2054   BILIRUBINUR NEGATIVE 12/04/2019 2054    [place] 12/04/2019 2054   PROTEINUR 100 (A) 12/04/2019 2054   NITRITE NEGATIVE 12/04/2019 2054   LEUKOCYTESUR LARGE (A) 12/04/2019 2054   Sepsis Labs: @LABRCNTIP (procalcitonin:4,lacticidven:4) ) Recent Results (from the past 240 hour(s))  SARS Coronavirus 2 by RT PCR (hospital order, performed in [facility] hospital lab) Nasopharyngeal Nasopharyngeal Swab     Status: None   Collection Time: 03/29/20 10:15 PM   Specimen: Nasopharyngeal Swab  Result Value Ref Range Status   SARS Coronavirus 2 NEGATIVE NEGATIVE Final    Comment: (NOTE) SARS-CoV-2 target nucleic acids are NOT DETECTED. The SARS-CoV-2 RNA is generally detectable in upper and lower respiratory specimens during the acute phase of infection. The lowest concentration of SARS-CoV-2 viral copies this assay can detect is 250 copies / mL. A negative result does not preclude SARS-CoV-2 infection and should not be used as the sole basis for treatment or other patient management decisions.  A negative result may occur with improper specimen collection / handling, submission of specimen other than nasopharyngeal swab, presence of viral mutation(s) within the areas targeted by this assay, and inadequate number of viral copies (<250 copies / mL). A negative result must be combined with clinical observations, patient history, and epidemiological information. Fact Sheet for Patients:   StrictlyIdeas.no Fact Sheet for Healthcare Providers: BankingDealers.co.za This test is not yet approved or cleared  by the [place] FDA and has been authorized for detection and/or diagnosis of SARS-CoV-2 by FDA under an Emergency Use Authorization (EUA).  This EUA will remain in effect (meaning this test can be used) for the duration of the COVID-19 declaration under Section 564(b)(1) of the Act, 21 U.S.C. section 360bbb-3(b)(1), unless the authorization is terminated or revoked  sooner. Performed at [facility], [address] [place], [place] [address]      Radiological Exams on Admission: DG Chest Portable 1 View  Result Date: 03/29/2020 CLINICAL DATA:  Shortness of breath EXAM: PORTABLE CHEST 1 VIEW COMPARISON:  12/04/2019 FINDINGS: Cardiac shadow is mildly enlarged. Aortic calcifications are seen. Bilateral airspace opacities are noted increased from the prior exam likely representing some acute on chronic multifocal pneumonia. Possible mild pulmonary edema could not be totally excluded. No sizable effusion is seen. IMPRESSION: Increased parenchymal opacities bilaterally. This may represent acute on chronic infiltrate or possibly some acute pulmonary edema superimposed over more chronic pattern. Electronically Signed   By: Inez Catalina M.D.   On: 03/29/2020 22:37    EKG: Independently reviewed.  Sinus tachycardia with no significant ST changes  Assessment/Plan Principal Problem:   Acute on chronic respiratory failure with hypoxemia (HCC) Active Problems:   ESRD (end stage renal disease) on dialysis ([place])   Type 2 diabetes mellitus with other specified complication (HCC)   Acute on chronic diastolic CHF (congestive heart failure) (HCC)   HTN (hypertension)   Anxiety     #1 acute respiratory failure with hypoxia: Suspected flash pulmonary edema.  Patient is currently on BiPAP.  Will follow nephrology recommendation start on IV Lasix 80 mg twice a day until patient is dialyzed.  Continue oxygenation and supportive care.  No indication for emergent dialysis based on her electrolytes.  #2 diabetes: Continue home regimen with sliding scale insulin.  #3 hypertension: Resume home regimen with additional Lasix as above.  #4 end-stage renal disease: Hemodialysis on Tuesdays Thursdays and Saturdays.  Due for hemodialysis first in the morning.  Continue per nephrology.  #5 anxiety disorder: Confirm and resume home regimen.  #6 diastolic heart failure:  Previous echo showed EF of 60%.  Continue treatment as per #1 above.   DVT prophylaxis: Heparin Code Status: Full code Family Communication: No family currently at bedside Disposition Plan: Home Consults called: Dr. Holley Raring of nephrology Admission status: Inpatient  Severity of Illness: The appropriate patient status for this patient is INPATIENT. Inpatient status is judged to be reasonable and necessary in order to provide the required intensity of service to ensure the patient's safety. The patient's presenting symptoms, physical exam findings, and initial radiographic and laboratory data in the context of their chronic comorbidities is felt to place them at high risk for further clinical deterioration. Furthermore, it is not anticipated that the patient will be medically stable for discharge from the hospital within 2 midnights of admission. The following factors support the patient status of inpatient.   " The patient's presenting symptoms include shortness of breath. " The worrisome physical exam findings include tachypnea with bilateral crackles. " The initial radiographic and laboratory data are worrisome because of bilateral pulmonary edema. " The chronic co-morbidities include end-stage renal disease.   * I certify that at the point of admission it is my clinical judgment that the patient will require inpatient hospital care spanning beyond 2 midnights from the point of admission due to high intensity of service, high risk for further deterioration and high frequency of surveillance required.Barbette Merino MD [facility] Pager [phone]  If 7PM-7AM, please contact night-coverage [email or web address] Password [facility]  03/30/2020, 12:13 AM

## 2020-03-30 NOTE — Progress Notes (Signed)
   03/30/20 1355  Vital Signs  Pulse Rate 66  Resp 18  BP (!) 147/68  BP Location Left Arm  BP Method Automatic  Patient Position (if appropriate) Lying  Oxygen Therapy  SpO2 99 %  O2 Device Nasal Cannula  O2 Flow Rate (L/min) 4 L/min  Patient Activity (if Appropriate) In bed  Pain Assessment  Pain Scale 0-10  Pain Score 0  PAINAD (Pain Assessment in Advanced Dementia)  Breathing 1  Facial Expression 0  During Hemodialysis Assessment  Blood Flow Rate (mL/min) 200 mL/min  Arterial Pressure (mmHg) -50 mmHg  Venous Pressure (mmHg) 10 mmHg  Transmembrane Pressure (mmHg) 30 mmHg  Ultrafiltration Rate (mL/min) 30 mL/min  Dialysate Flow Rate (mL/min) 800 ml/min  Conductivity: Machine  13.6  HD Safety Checks Performed Yes  KECN 65.6 KECN  Dialysis Fluid Bolus Normal Saline  Intra-Hemodialysis Comments Tx completed  Post-Hemodialysis Assessment  Rinseback Volume (mL) 250 mL  KECN 65.6 V  Dialyzer Clearance Clear  Duration of HD Treatment -hour(s) 3 hour(s)  Hemodialysis Intake (mL) 2000 mL  UF Total -Machine (mL) 1873 mL  Net UF (mL) -127 mL  Tolerated HD Treatment Yes  Post-Hemodialysis Comments treatment complete no complicatins  AVG/AVF Arterial Site Held (minutes) 5 minutes  AVG/AVF Venous Site Held (minutes) 5 minutes  Education / Care Plan  Dialysis Education Provided Yes  Documented Education in Care Plan Yes  Fistula / Graft Left Forearm Arteriovenous fistula  No Placement Date or Time found.   Placed prior to admission: Yes  Orientation: Left  Access Location: Forearm  Access Type: Arteriovenous fistula  Site Condition No complications  Fistula / Graft Assessment Present;Thrill;Bruit  Status Deaccessed  Drainage Description None  Treatment completed no s/s of distress to note

## 2020-03-30 NOTE — Progress Notes (Signed)
  PROGRESS NOTE    Phyllis [name]  GNF:[id number] DOB: 1946-01-24 DOA: 03/29/2020  PCP: [name], MD    LOS - 1    Patient admitted overnight with acute respiratory failure with hypoxia secondary to volume overload and flash pulmonary edema.  She required bipap initially, has since been weaned off after receiving dialysis.  Interval subjective: Patient seen this AM while still in the ED on bipap.  Resting comfortably without distress and stable vitals.  O2 sat 100% on bipap at FiO2 35%.  Exam: sleeping, on bipap, no acute distress, heart RRR, lungs sound clear anteriorly on limited exam, no edema.  I have reviewed the full H&P by Dr. Jonelle Sidle in detail, and I agree with the assessment and plan as outlined therein.  In addition: --patient off bipap, can go to med/surg   No Charge    Ezekiel Slocumb, DO [facility]   If 7PM-7AM, please contact night-coverage [email or web address] 03/30/2020, 4:51 PM

## 2020-03-30 NOTE — ED Provider Notes (Signed)
[facility] Emergency Department Provider Note   ____________________________________________   I have reviewed the triage vital signs and the nursing notes.   HISTORY  Chief Complaint Respiratory Distress   History limited by: Acute medical illness   HPI Phyllis [name] is a 74 y.o. female who presents to the emergency department today via EMS as emergency traffic due to shortness of breath. Apparently the patient had sudden onset of shortness of breath this evening. When first responders arrived the patient was found to be hypoxic. Patient was initially placed on nonrebreather however EMS switched her to CPAP. They also applied nitro paste given her hypertension. They did state that the patient seemed to respond to these therapies. The patient herself has a hard time giving significant history given level of shortness of breath although is able to state that the shortness of breath started today.  Records reviewed. Per medical record review patient has a history of heart failure.  Past Medical History:  Diagnosis Date  . (HFpEF) heart failure with preserved ejection fraction ([place])    a. TTE 01/2014: nl LV sys fxn, no valvular abnormalities; b. TTE 11/16: nl EF, mild LVH;  c. 04/2019 Echo: EF 60-65%. DD. Nl RV fxn. Mod AS. 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  . Allergy   . Anemia of chronic disease   . Anxiety   . Aortic atherosclerosis ([place])   . Asthma   . Chronic back pain   . COPD (chronic obstructive pulmonary disease) ([place])   . Diabetes mellitus with complication ([place])   . ESRD on hemodialysis ([place])    a. Tues/Sat; b. 2/2 small kidneys  . Essential hypertension   . Fistula    lower left arm  . GERD (gastroesophageal reflux disease)   . Gout   . History of exercise stress test    a. 01/2014: no evidence of ischemia; b. Lexiscan 08/2015: no sig ischemia, severe GI uptake artifact, low risk; c. CPET @ [facility] 09/2016: exercised 3 min  12 sec on bike without incline, 2.28 METs, VO2 of 8.1, 48% of predicted, indicating mod to sev functional impairment, evidence of blunted HR, stroke volume, and BP augmentation as well as ventilation-perfusion mismatch with exercise  . HLD (hyperlipidemia)   . Non-obstructive Carotid arterial disease ([place])    a. 12/2017: <50% bilat ICA dzs.  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 Severe aortic stenosis    a. by 09/2019 echo b. 04/2019 Echo: Mod AS.  Marland Kitchen Sleep apnea     Patient Active Problem List   Diagnosis Date Noted  . Acute on chronic respiratory failure with hypoxemia ([place]) 03/29/2020  . Asymptomatic bacteriuria 12/05/2019  . Acute respiratory failure ([place]) 10/23/2019  . COPD with acute exacerbation ([place]) 10/23/2019  . Elevated troponin 10/23/2019  . CAD (coronary artery disease) 10/23/2019  . COPD exacerbation ([place]) 10/23/2019  . Leukocytosis 10/23/2019  . Anxiety 10/23/2019  . Palliative care by specialist   . Goals of care, counseling/discussion   . Flash pulmonary edema ([place]) 10/14/2019  . Respiratory failure with hypoxia ([place]) 10/11/2019  . 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-stage renal disease) ([place])   . Acute on chronic respiratory failure with hypoxia ([place]) 10/01/2019  . Severe aortic valve stenosis   . Acute respiratory failure with hypoxemia ([place]) 09/20/2019  . COPD with acute bronchitis ([place]) 09/05/2019  . Congestive heart failure ([place]) 05/24/2019  . Hyperkalemia 11/29/2018  . Chronic diastolic heart failure ([place])  11/02/2018  . HTN (hypertension) 11/02/2018  . Uncontrolled hypertension 10/21/2018  . History of acute pulmonary edema 10/21/2018  . Pressure injury of skin 03/16/2018  . CVA (cerebral vascular accident) ([place]) 01/16/2018  . Aortic atherosclerosis ([place]) 12/11/2017  . Chest pain 09/18/2017  . Hyperlipidemia 08/06/2015  . Complication from renal dialysis device 04/12/2015  . SOB (shortness of breath) 02/01/2015  . ESRD (end stage renal disease)  on dialysis ([place]) 02/01/2015  . Type 2 diabetes mellitus with other specified complication ([place]) 57/84/6962  . Asthma 02/01/2015  . Acute on chronic diastolic CHF (congestive heart failure) ([place]) 02/01/2015    Past Surgical History:  Procedure Laterality Date  . CARDIAC CATHETERIZATION    . carpel tunnel    . GALLBLADDER SURGERY    . PERIPHERAL VASCULAR CATHETERIZATION N/A 04/12/2015   Procedure: A/V Shuntogram/Fistulagram;  Surgeon: Algernon Huxley, MD;  Location: [place] [facility];  Service: Cardiovascular;  Laterality: N/A;  . PERIPHERAL VASCULAR CATHETERIZATION N/A 04/12/2015   Procedure: A/V Shunt Intervention;  Surgeon: Algernon Huxley, MD;  Location: [place] [facility];  Service: Cardiovascular;  Laterality: N/A;  . PERIPHERAL VASCULAR CATHETERIZATION N/A 06/09/2015   Procedure: Dialysis/Perma Catheter Removal;  Surgeon: Katha Cabal, MD;  Location: [place] [facility];  Service: Cardiovascular;  Laterality: N/A;  . RIGHT/LEFT HEART CATH AND CORONARY ANGIOGRAPHY N/A 10/14/2019   Procedure: RIGHT/LEFT HEART CATH AND CORONARY ANGIOGRAPHY;  Surgeon: Belva Crome, MD;  Location: [place] [facility];  Service: Cardiovascular;  Laterality: N/A;    Prior to Admission medications   Medication Sig Start Date End Date Taking? Authorizing Provider  acetaminophen (TYLENOL) 500 MG tablet Take 1 tablet (500 mg total) by mouth every 6 (six) hours as needed for mild pain or fever. 09/29/19  Yes Danford, Suann Larry, MD  albuterol (PROVENTIL HFA;VENTOLIN HFA) 108 (90 Base) MCG/ACT inhaler Inhale 2 puffs into the lungs every 6 (six) hours as needed for wheezing or shortness of breath. Out of medication   Yes [provider]  amLODipine (NORVASC) 10 MG tablet Take 10 mg by mouth daily.  12/29/13  Yes [provider]  ASPIRIN 81 PO Take 1 tablet by mouth daily. 01/27/20  Yes [provider]  calcium acetate (PHOSLO) 667 MG capsule Take 2 capsules by mouth in the morning, at  noon, and at bedtime. 12/31/19  Yes [provider]  carvedilol (COREG) 12.5 MG tablet Take 12.5 mg by mouth 2 (two) times daily with a meal.  01/09/14  Yes [provider]  cetirizine (ZYRTEC) 10 MG tablet Take 10 mg by mouth daily as needed for allergies.    Yes [provider]  clopidogrel (PLAVIX) 75 MG tablet Take 1 tablet (75 mg total) by mouth daily. 01/18/18  Yes Epifanio Lesches, MD  [facility] MELATONIN 5 MG CHEW Chew 1 tablet by mouth at bedtime. 02/02/20  Yes [provider]  hydrALAZINE (APRESOLINE) 50 MG tablet TAKE 1 TABLET BY MOUTH EVERY 8 HOURS 03/08/20  Yes Gollan, Kathlene November, MD  HYDROcodone-acetaminophen (NORCO) 7.5-325 MG tablet Take 1 tablet by mouth every 6 (six) hours as needed (pain). 10/08/19  Yes Danford, Suann Larry, MD  iron polysaccharides (NIFEREX) 150 MG capsule Take 150 mg by mouth daily. 01/09/20 01/08/21 Yes [provider]  lidocaine (LIDODERM) 5 % Place 1 patch onto the skin daily. Remove & Discard patch within 12 hours or as directed by MD 10/14/19  Yes Flora Lipps, MD  lidocaine-prilocaine (EMLA) cream Apply 1 application  topically as needed (prior to treatment).    Yes [provider]  losartan (COZAAR) 50 MG tablet Take 50 mg by mouth daily. 03/18/19  Yes [provider]  oxybutynin (DITROPAN XL) 15 MG 24 hr tablet Take 15 mg by mouth daily. 12/24/17  Yes [provider]  pravastatin (PRAVACHOL) 40 MG tablet Take 40 mg by mouth at bedtime.    Yes [provider]  sertraline (ZOLOFT) 50 MG tablet Take 50 mg by mouth at bedtime. 03/02/20  Yes [provider]  ipratropium-albuterol (DUONEB) 0.5-2.5 (3) MG/3ML SOLN Take 3 mLs by nebulization every 6 (six) hours as needed. 10/17/19   [facility], Tami Lin, PA  mirtazapine (REMERON) 15 MG tablet Take 15 mg by mouth at bedtime. 02/25/20   [provider]  topiramate (TOPAMAX) 25 MG tablet Take 1 tablet (25 mg total) by mouth as needed  (for headaches). 09/07/19   Henreitta Leber, MD    Allergies Enalapril maleate; Nitrofurantoin; Sulfamethoxazole-trimethoprim; 2,4-d dimethylamine (amisol); Baclofen; Neosporin [neomycin-bacitracin zn-polymyx]; Quinine; Ultram [tramadol]; Zocor [simvastatin]; Bactrim [sulfamethoxazole-trimethoprim]; Levodopa; Macrodantin [nitrofurantoin macrocrystal]; and Quinine derivatives  Family History  Problem Relation Age of Onset  . Hypertension Mother   . Hyperlipidemia Mother   . Heart disease Father   . Heart attack Father 36  . Hypertension Father   . Hyperlipidemia Father   . Heart disease Brother        CABG   . Heart attack Brother   . Breast cancer Neg Hx     Social History Social History   Tobacco Use  . Smoking status: Never Smoker  . Smokeless tobacco: Never Used  Substance Use Topics  . Alcohol use: No  . Drug use: No    Review of Systems limited secondary to respiratory distress Constitutional: No fever Cardiovascular: Denies chest pain. Respiratory: Positive shortness of breath. Gastrointestinal: No abdominal pain.  No nausea, no vomiting.  No diarrhea.   ____________________________________________   PHYSICAL EXAM:  VITAL SIGNS: ED Triage Vitals  Enc Vitals Group     BP 03/29/20 2207 (!) 142/60     Pulse Rate 03/29/20 2207 (!) 110     Resp 03/29/20 2207 (!) 30     Temp --      Temp src --      SpO2 03/29/20 2155 (!) 84 %     Weight --      Height --      Head Circumference --      Peak Flow --      Pain Score 03/30/20 1050 0   Constitutional: Alert and oriented.  Eyes: Conjunctivae are normal.  ENT      Head: Normocephalic and atraumatic.      Nose: No congestion/rhinnorhea.      Mouth/Throat: Mucous membranes are moist.      Neck: No stridor. Hematological/Lymphatic/Immunilogical: No cervical lymphadenopathy. Cardiovascular: Tachycardic, regular rhythm.   Respiratory: Increased respiratory effort. Diffuse crackles appreciated.   Gastrointestinal: Soft and non tender. No rebound. No guarding.  Genitourinary: Deferred Musculoskeletal: Normal range of motion in all extremities. No lower extremity edema. Neurologic:  Normal speech and language. No gross focal neurologic deficits are appreciated.  Skin:  Skin is warm, dry and intact. No rash noted. Psychiatric: Mood and affect are normal. Speech and behavior are normal. Patient exhibits appropriate insight and judgment.  ____________________________________________    LABS (pertinent positives/negatives)  CMP na 140, k 5.0, glu 186, cr 7.45 CBC wbc 8.7, hgb 9.8, plt 177 BNP 1226  ____________________________________________   EKG  Apolonio Schneiders, attending physician, personally viewed and interpreted this EKG  EKG Time: 2155 Rate: 102 Rhythm: sinus tachycardia Axis: normal Intervals: qtc 473 QRS: narrow ST changes: no st elevation Impression: abnormal ekg  ____________________________________________    RADIOLOGY  CXR Increased parenchymal opacities bilaterally  ____________________________________________   PROCEDURES  Procedures  CRITICAL CARE Performed by: Nance Pear   Total critical care time: 30 minutes  Critical care time was exclusive of separately billable procedures and treating other patients.  Critical care was necessary to treat or prevent imminent or life-threatening deterioration.  Critical care was time spent personally by me on the following activities: development of treatment plan with patient and/or surrogate as well as nursing, discussions with consultants, evaluation of patient's response to treatment, examination of patient, obtaining history from patient or surrogate, ordering and performing treatments and interventions, ordering and review of laboratory studies, ordering and review of radiographic studies, pulse oximetry and re-evaluation of patient's  condition.  ____________________________________________   INITIAL IMPRESSION / ASSESSMENT AND PLAN / ED COURSE  Pertinent labs & imaging results that were available during my care of the patient were reviewed by me and considered in my medical decision making (see chart for details).   Patient presented to the emergency department today because of concerns for sudden onset of shortness of breath.  Patient does have a history of heart failure as well as end-stage renal disease.  Patient was found to be hypoxic and hypertensive by first responders.  On my exam patient has diffuse crackles.  Blood pressure has improved over what it was got in [place].  Will switch patient over to BiPAP.  I do have concerns for flash pulmonary edema.  Discussed with Dr. Holley Raring with nephrology.  Will plan on giving IV Lasix. Will plan on admission.   ____________________________________________   FINAL CLINICAL IMPRESSION(S) / ED DIAGNOSES  Final diagnoses:  Shortness of breath  Acute pulmonary edema ([place])     Note: This dictation was prepared with Dragon dictation. Any transcriptional errors that result from this process are unintentional     Nance Pear, MD 03/30/20 1559

## 2020-03-31 ENCOUNTER — Other Ambulatory Visit: Payer: Self-pay

## 2020-03-31 ENCOUNTER — Inpatient Hospital Stay: Payer: Medicare Other

## 2020-03-31 ENCOUNTER — Encounter: Payer: Self-pay | Admitting: Internal Medicine

## 2020-03-31 LAB — BASIC METABOLIC PANEL
Anion gap: 11 (ref 5–15)
BUN: 41 mg/dL — ABNORMAL HIGH (ref 8–23)
CO2: 33 mmol/L — ABNORMAL HIGH (ref 22–32)
Calcium: 8.2 mg/dL — ABNORMAL LOW (ref 8.9–10.3)
Chloride: 97 mmol/L — ABNORMAL LOW (ref 98–111)
Creatinine, Ser: 4.8 mg/dL — ABNORMAL HIGH (ref 0.44–1.00)
GFR calc Af Amer: 10 mL/min — ABNORMAL LOW (ref >60)
GFR calc non Af Amer: 8 mL/min — ABNORMAL LOW (ref >60)
Glucose, Bld: 83 mg/dL (ref 70–99)
Potassium: 4.3 mmol/L (ref 3.5–5.1)
Sodium: 141 mmol/L (ref 135–145)

## 2020-03-31 LAB — CBC
HCT: 23.7 % — ABNORMAL LOW (ref 36.0–46.0)
Hemoglobin: 7.8 g/dL — ABNORMAL LOW (ref 12.0–15.0)
MCH: 29.3 pg (ref 26.0–34.0)
MCHC: 32.9 g/dL (ref 30.0–36.0)
MCV: 89.1 fL (ref 80.0–100.0)
Platelets: 117 10*3/uL — ABNORMAL LOW (ref 150–400)
RBC: 2.66 MIL/uL — ABNORMAL LOW (ref 3.87–5.11)
RDW: 14.5 % (ref 11.5–15.5)
WBC: 4.2 10*3/uL (ref 4.0–10.5)
nRBC: 0 % (ref 0.0–0.2)

## 2020-03-31 LAB — GLUCOSE, CAPILLARY
Glucose-Capillary: 80 mg/dL (ref 70–99)
Glucose-Capillary: 85 mg/dL (ref 70–99)
Glucose-Capillary: 86 mg/dL (ref 70–99)
Glucose-Capillary: 93 mg/dL (ref 70–99)

## 2020-03-31 MED ORDER — NITROGLYCERIN 2 % TD OINT
1.0000 [in_us] | TOPICAL_OINTMENT | Freq: Three times a day (TID) | TRANSDERMAL | Status: DC
  Filled 2020-03-31: qty 1

## 2020-03-31 NOTE — Progress Notes (Signed)
PROGRESS NOTE    Phyllis [name]  FGH:[id number] DOB: 08/04/1946 DOA: 03/29/2020 PCP: [name], MD    Brief Narrative:  Patient admitted overnight with acute respiratory failure with hypoxia secondary to volume overload and flash pulmonary edema.  She required bipap initially, has since been weaned off after receiving dialysis.  Interval subjective: Patient seen this AM while still in the ED on bipap.  Resting comfortably without distress and stable vitals.  O2 sat 100% on bipap at FiO2 35%.    Consultants:  nephrology  Procedures: HD  Antimicrobials:       Subjective: Feeling better, less sob. On 4L [place] now  Objective: Vitals:   03/31/20 0500 03/31/20 0612 03/31/20 0930 03/31/20 1319  BP:  (!) 142/45 (!) 139/45 (!) 160/53  Pulse:  67 74 66  Resp:  20    Temp:  97.9 F (36.6 C)    TempSrc:  Oral    SpO2:      Weight: 57.3 kg 57.3 kg    Height:  4\' 10"  (1.473 m)      Intake/Output Summary (Last 24 hours) at 03/31/2020 1652 Last data filed at 03/31/2020 1419 Gross per 24 hour  Intake 603 ml  Output --  Net 603 ml   Filed Weights   03/31/20 0500 03/31/20 0612  Weight: 57.3 kg 57.3 kg    Examination:  General exam: Appears calm and comfortable  Respiratory system: scattered crackles at bases Cardiovascular system: S1 & S2 heard, RRR. No JVD, 2/6 sm Gastrointestinal system: Abdomen is nondistended, soft and nontender. Normal bowel sounds heard. Central nervous system: Alert and oriented.  Extremities: No edema Skin: Warm dry Psychiatry:  Mood & affect appropriate in current setting.     Data Reviewed: I have personally reviewed following labs and imaging studies  CBC: Recent Labs  Lab 03/29/20 2214 03/30/20 1110 03/31/20 0527  WBC 8.7 5.0 4.2  NEUTROABS 6.7 3.8  --   HGB 9.8* 8.4* 7.8*  HCT 29.7* 25.3* 23.7*  MCV 88.9 87.8 89.1  PLT 177 133* 678*   Basic Metabolic Panel: Recent Labs  Lab 03/29/20 2214 03/30/20 1804  03/31/20 0527  NA 140  --  141  K 5.0  --  4.3  CL 100  --  97*  CO2 24  --  33*  GLUCOSE 186*  --  83  BUN 74*  --  41*  CREATININE 7.45*  --  4.80*  CALCIUM 8.0*  --  8.2*  PHOS  --  3.0  --    GFR: Estimated Creatinine Clearance: 7.8 mL/min (A) (by C-G formula based on SCr of 4.8 mg/dL (H)). Liver Function Tests: No results for input(s): AST, ALT, ALKPHOS, BILITOT, PROT, ALBUMIN in the last 168 hours. No results for input(s): LIPASE, AMYLASE in the last 168 hours. No results for input(s): AMMONIA in the last 168 hours. Coagulation Profile: No results for input(s): INR, PROTIME in the last 168 hours. Cardiac Enzymes: No results for input(s): CKTOTAL, CKMB, CKMBINDEX, TROPONINI in the last 168 hours. BNP (last 3 results) No results for input(s): PROBNP in the last 8760 hours. HbA1C: Recent Labs    03/30/20 0033  HGBA1C 5.1   CBG: Recent Labs  Lab 03/30/20 1752 03/30/20 2124 03/31/20 0750 03/31/20 1131 03/31/20 1627  GLUCAP 141* 85 80 85 86   Lipid Profile: No results for input(s): CHOL, HDL, LDLCALC, TRIG, CHOLHDL, LDLDIRECT in the last 72 hours. Thyroid Function Tests: No results for input(s): TSH, T4TOTAL, FREET4, T3FREE, THYROIDAB  in the last 72 hours. Anemia Panel: No results for input(s): VITAMINB12, FOLATE, FERRITIN, TIBC, IRON, RETICCTPCT in the last 72 hours. Sepsis Labs: No results for input(s): PROCALCITON, LATICACIDVEN in the last 168 hours.  Recent Results (from the past 240 hour(s))  SARS Coronavirus 2 by RT PCR (hospital order, performed in [facility] hospital lab) Nasopharyngeal Nasopharyngeal Swab     Status: None   Collection Time: 03/29/20 10:15 PM   Specimen: Nasopharyngeal Swab  Result Value Ref Range Status   SARS Coronavirus 2 NEGATIVE NEGATIVE Final    Comment: (NOTE) SARS-CoV-2 target nucleic acids are NOT DETECTED. The SARS-CoV-2 RNA is generally detectable in upper and lower respiratory specimens during the acute phase of  infection. The lowest concentration of SARS-CoV-2 viral copies this assay can detect is 250 copies / mL. A negative result does not preclude SARS-CoV-2 infection and should not be used as the sole basis for treatment or other patient management decisions.  A negative result may occur with improper specimen collection / handling, submission of specimen other than nasopharyngeal swab, presence of viral mutation(s) within the areas targeted by this assay, and inadequate number of viral copies (<250 copies / mL). A negative result must be combined with clinical observations, patient history, and epidemiological information. Fact Sheet for Patients:   StrictlyIdeas.no Fact Sheet for Healthcare Providers: BankingDealers.co.za This test is not yet approved or cleared  by the [place] FDA and has been authorized for detection and/or diagnosis of SARS-CoV-2 by FDA under an Emergency Use Authorization (EUA).  This EUA will remain in effect (meaning this test can be used) for the duration of the COVID-19 declaration under Section 564(b)(1) of the Act, 21 U.S.C. section 360bbb-3(b)(1), unless the authorization is terminated or revoked sooner. Performed at [facility], [address], [place], [place] [address]          Radiology Studies: DG Chest Portable 1 View  Result Date: 03/29/2020 CLINICAL DATA:  Shortness of breath EXAM: PORTABLE CHEST 1 VIEW COMPARISON:  12/04/2019 FINDINGS: Cardiac shadow is mildly enlarged. Aortic calcifications are seen. Bilateral airspace opacities are noted increased from the prior exam likely representing some acute on chronic multifocal pneumonia. Possible mild pulmonary edema could not be totally excluded. No sizable effusion is seen. IMPRESSION: Increased parenchymal opacities bilaterally. This may represent acute on chronic infiltrate or possibly some acute pulmonary edema superimposed over more  chronic pattern. Electronically Signed   By: Inez Catalina M.D.   On: 03/29/2020 22:37        Scheduled Meds: . amLODipine  10 mg Oral Daily  . aspirin  81 mg Oral Daily  . calcium acetate  1,334 mg Oral TID WC  . carvedilol  12.5 mg Oral BID WC  . clopidogrel  75 mg Oral Daily  . [START ON 04/01/2020] epoetin (EPOGEN/PROCRIT) injection  4,000 Units Intravenous Q T,Th,Sa-HD  . furosemide  80 mg Intravenous BID  . hydrALAZINE  50 mg Oral Q8H  . insulin aspart  0-5 Units Subcutaneous QHS  . insulin aspart  0-6 Units Subcutaneous TID WC  . iron polysaccharides  150 mg Oral Daily  . lidocaine  1 patch Transdermal Q24H  . loratadine  10 mg Oral Daily  . losartan  50 mg Oral Daily  . mirtazapine  15 mg Oral QHS  . oxybutynin  15 mg Oral Daily  . pravastatin  40 mg Oral q1800  . sertraline  50 mg Oral QHS  . sodium chloride flush  3 mL  Intravenous Q12H   Continuous Infusions: . sodium chloride    . sodium chloride    . sodium chloride      Assessment & Plan:   Principal Problem:   Acute on chronic respiratory failure with hypoxemia (HCC) Active Problems:   ESRD (end stage renal disease) on dialysis ([place])   Type 2 diabetes mellitus with other specified complication (HCC)   Acute on chronic diastolic CHF (congestive heart failure) (HCC)   HTN (hypertension)   Anxiety   #1 acute respiratory failure with hypoxia: Suspected flash pulmonary edema.  Patient is currently on BiPAP.  Will follow nephrology recommendation continue IV Lasix 80 mg twice a day until patient is dialyzed.  Continue oxygenation and supportive care.  No indication for emergent dialysis based on her electrolytes.  #2 diabetes: Continue home regimen with sliding scale insulin.  #3 hypertension: Resume home regimen with additional Lasix as above.  #4 end-stage renal disease: Hemodialysis on Tuesdays Thursdays and Saturdays.  Due for hemodialysis first in the morning.  Continue per nephrology.  #5 anxiety  disorder: Confirm and resume home regimen.  #6 diastolic heart failure: Previous echo showed EF of 60%.  Continue treatment as per #1 above.  #6 anemia of chronic kidney disease Started on Epogen 4000 units IV with dialysis treatments  #Thrombocytopenia- etiology unclear. Will monitor. D/c heparin sq. Does get hepain during HD. If continues will consult heme   DVT prophylaxis: scd Code Status: Full code Family Communication: No family currently at bedside Disposition Plan: Home Barrier: needs iv lasix, needs HD, on oxygen , needs to wean off. Platelets dropping now, needs to monitor       LOS: 2 days   Time spent: 45 min with >50 on coc    Nolberto Hanlon, MD [facility] Pager [phone]  If 7PM-7AM, please contact night-coverage [email or web address] Password [facility] 03/31/2020, 4:52 PM

## 2020-03-31 NOTE — Progress Notes (Signed)
[facility]  ROUNDING NOTE   Subjective:  Patient's respiratory status improved yesterday after dialysis. Appears to be in good spirits.   Objective:  Vital signs in last 24 hours:  Temp:  [97.9 F (36.6 C)-98.5 F (36.9 C)] 98.5 F (36.9 C) (05/12 2113) Pulse Rate:  [63-74] 70 (05/12 2113) Resp:  [16-24] 24 (05/12 2113) BP: (119-160)/(45-53) 157/52 (05/12 2113) SpO2:  [97 %-100 %] 97 % (05/12 2113) Weight:  [57.3 kg] 57.3 kg (05/12 0612)  Weight change:  Filed Weights   03/31/20 0500 03/31/20 0612  Weight: 57.3 kg 57.3 kg    Intake/Output: I/O last 3 completed shifts: In: 536 [P.O.:840; I.V.:3] Out: -127    Intake/Output this shift:  No intake/output data recorded.  Physical Exam: General: No acute distress  Head: Normocephalic, atraumatic. Moist oral mucosal membranes  Eyes: Anicteric  Neck: Supple, trachea midline  Lungs:  Clear to auscultation, normal effort  Heart: S1S2 2/6 SEM  Abdomen:  Soft, nontender, bowel sounds present  Extremities: Trace peripheral edema.  Neurologic: Awake, alert, following commands  Skin: No lesions  Access: Left upper extremity AV fistula    Basic Metabolic Panel: Recent Labs  Lab 03/29/20 2214 03/30/20 1804 03/31/20 0527  NA 140  --  141  K 5.0  --  4.3  CL 100  --  97*  CO2 24  --  33*  GLUCOSE 186*  --  83  BUN 74*  --  41*  CREATININE 7.45*  --  4.80*  CALCIUM 8.0*  --  8.2*  PHOS  --  3.0  --     Liver Function Tests: No results for input(s): AST, ALT, ALKPHOS, BILITOT, PROT, ALBUMIN in the last 168 hours. No results for input(s): LIPASE, AMYLASE in the last 168 hours. No results for input(s): AMMONIA in the last 168 hours.  CBC: Recent Labs  Lab 03/29/20 2214 03/30/20 1110 03/31/20 0527  WBC 8.7 5.0 4.2  NEUTROABS 6.7 3.8  --   HGB 9.8* 8.4* 7.8*  HCT 29.7* 25.3* 23.7*  MCV 88.9 87.8 89.1  PLT 177 133* 117*    Cardiac Enzymes: No results for input(s): CKTOTAL, CKMB, CKMBINDEX,  TROPONINI in the last 168 hours.  BNP: Invalid input(s): POCBNP  CBG: Recent Labs  Lab 03/30/20 1752 03/30/20 2124 03/31/20 0750 03/31/20 1131 03/31/20 1627  GLUCAP 141* 85 80 85 86    Microbiology: Results for orders placed or performed during the hospital encounter of 03/29/20  SARS Coronavirus 2 by RT PCR (hospital order, performed in [facility] hospital lab) Nasopharyngeal Nasopharyngeal Swab     Status: None   Collection Time: 03/29/20 10:15 PM   Specimen: Nasopharyngeal Swab  Result Value Ref Range Status   SARS Coronavirus 2 NEGATIVE NEGATIVE Final    Comment: (NOTE) SARS-CoV-2 target nucleic acids are NOT DETECTED. The SARS-CoV-2 RNA is generally detectable in upper and lower respiratory specimens during the acute phase of infection. The lowest concentration of SARS-CoV-2 viral copies this assay can detect is 250 copies / mL. A negative result does not preclude SARS-CoV-2 infection and should not be used as the sole basis for treatment or other patient management decisions.  A negative result may occur with improper specimen collection / handling, submission of specimen other than nasopharyngeal swab, presence of viral mutation(s) within the areas targeted by this assay, and inadequate number of viral copies (<250 copies / mL). A negative result must be combined with clinical observations, patient history, and epidemiological information. Fact Sheet  for Patients:   StrictlyIdeas.no Fact Sheet for Healthcare Providers: BankingDealers.co.za This test is not yet approved or cleared  by the [place] FDA and has been authorized for detection and/or diagnosis of SARS-CoV-2 by FDA under an Emergency Use Authorization (EUA).  This EUA will remain in effect (meaning this test can be used) for the duration of the COVID-19 declaration under Section 564(b)(1) of the Act, 21 U.S.C. section 360bbb-3(b)(1), unless the  authorization is terminated or revoked sooner. Performed at [facility], [address] [place], [place] [address]     Coagulation Studies: No results for input(s): LABPROT, INR in the last 72 hours.  Urinalysis: No results for input(s): COLORURINE, LABSPEC, PHURINE, GLUCOSEU, HGBUR, BILIRUBINUR, KETONESUR, PROTEINUR, UROBILINOGEN, NITRITE, LEUKOCYTESUR in the last 72 hours.  Invalid input(s): APPERANCEUR    Imaging: DG Chest Portable 1 View  Result Date: 03/29/2020 CLINICAL DATA:  Shortness of breath EXAM: PORTABLE CHEST 1 VIEW COMPARISON:  12/04/2019 FINDINGS: Cardiac shadow is mildly enlarged. Aortic calcifications are seen. Bilateral airspace opacities are noted increased from the prior exam likely representing some acute on chronic multifocal pneumonia. Possible mild pulmonary edema could not be totally excluded. No sizable effusion is seen. IMPRESSION: Increased parenchymal opacities bilaterally. This may represent acute on chronic infiltrate or possibly some acute pulmonary edema superimposed over more chronic pattern. Electronically Signed   By: Inez Catalina M.D.   On: 03/29/2020 22:37     Medications:   . sodium chloride     . amLODipine  10 mg Oral Daily  . aspirin  81 mg Oral Daily  . calcium acetate  1,334 mg Oral TID WC  . carvedilol  12.5 mg Oral BID WC  . clopidogrel  75 mg Oral Daily  . [START ON 04/01/2020] epoetin (EPOGEN/PROCRIT) injection  4,000 Units Intravenous Q T,Th,Sa-HD  . furosemide  80 mg Intravenous BID  . hydrALAZINE  50 mg Oral Q8H  . insulin aspart  0-5 Units Subcutaneous QHS  . insulin aspart  0-6 Units Subcutaneous TID WC  . iron polysaccharides  150 mg Oral Daily  . lidocaine  1 patch Transdermal Q24H  . loratadine  10 mg Oral Daily  . losartan  50 mg Oral Daily  . mirtazapine  15 mg Oral QHS  . oxybutynin  15 mg Oral Daily  . pravastatin  40 mg Oral q1800  . sertraline  50 mg Oral QHS  . sodium chloride flush  3 mL  Intravenous Q12H   sodium chloride, acetaminophen, albuterol, alteplase, heparin, HYDROcodone-acetaminophen, ipratropium-albuterol, lidocaine (PF), lidocaine-prilocaine, lidocaine-prilocaine, ondansetron (ZOFRAN) IV, pentafluoroprop-tetrafluoroeth, sodium chloride flush, topiramate  Assessment/ Plan:  74 y.o. female with past medical history of ESRD on HD TTS, aortic stenosis, hypertension, COPD, diabetes mellitus type 2, overactive bladder, gout, GERD, chronic costochondritis admitted with shortness of breath, elevated blood pressure.  [facility]/TTS/[facility] [address]/left upper extremity AV fistula/57 kg  1.  ESRD on HD TTS.  Patient had dialysis yesterday.  No acute indication for dialysis today.  We will plan for hemodialysis again tomorrow.  2.  Anemia of chronic kidney disease.  Hemoglobin did drop a bit to 7.8.  Platelets also low.  Patient on Epogen.  Consider hematology input but defer to hospitalist.  3.  Secondary hyperparathyroidism.  Phosphorus 3.0 at last check.  Maintain the patient on calcium acetate 2 tablets p.o. 3 times daily with meals.  4.  Hypertension.  Maintain the patient on amlodipine, carvedilol, hydralazine, and losartan.     LOS: 2 Aldahir Litaker  5/12/20219:15 PM

## 2020-03-31 NOTE — Evaluation (Signed)
Physical Therapy Evaluation Patient Details Name: Phyllis [name] MRN: [id number] DOB: 03/28/1946 Today's Date: 03/31/2020   History of Present Illness  Pt is a 74 yo female that prestend to ED for SOB, initially placed on bipap. Admitted for acute on respiratory failure with hypoxia secondary to volume overload and flash pulmonary edema. PMH of CHF, anxiety, asthma, chronic back pain, COPD, DM, ESRD on HD, HTN, GERD, chronic costochrondritis.    Clinical Impression  Pt alert, oriented, reported she lives at home alone but has 24/7 assistance with mobility and ADLs. Stated she is able to ambulate with CGA, needs physical assistance for bed mobility as well as transfers, including getting in and out of the shower. Able to wash on her own, supervision/setup. Denies falls.  The patient demonstrated bed mobility with minA for successful weight shift to EOB. Sit<> stand with RW and minA. Pt ambulated ~68ft twice with RW and CGA, seated rest break between bouts. spO2 monitored, ~93% after ambulation on 2L. PT reported improvement in SOB compared to admission.  Overall the patient demonstrated mild deficits (see "PT Problem List") from PLOF  would benefit from skilled PT intervention to maximize strength, mobility, and independence. Recommendation is HHPT     Follow Up Recommendations Home health PT    Equipment Recommendations  None recommended by PT    Recommendations for Other Services       Precautions / Restrictions Precautions Precautions: Fall Restrictions Weight Bearing Restrictions: No      Mobility  Bed Mobility Overal bed mobility: Needs Assistance Bed Mobility: Supine to Sit     Supine to sit: Min assist;HOB elevated        Transfers Overall transfer level: Needs assistance Equipment used: Rolling walker (2 wheeled) Transfers: Sit to/from Stand Sit to Stand: Min assist            Ambulation/Gait Ambulation/Gait assistance: Min guard Gait Distance (Feet):  (5ft x2) Assistive device: Rolling walker (2 wheeled)       General Gait Details: CGA, occasionally lateral lean to L noted, pt able to self correct and navigate around obstacles well  Stairs            Wheelchair Mobility    Modified Rankin (Stroke Patients Only)       Balance Overall balance assessment: Needs assistance Sitting-balance support: Feet supported Sitting balance-Leahy Scale: Good       Standing balance-Leahy Scale: Good                               Pertinent Vitals/Pain Pain Assessment: Faces Faces Pain Scale: Hurts a little bit Pain Location: chronic back pain Pain Descriptors / Indicators: Aching Pain Intervention(s): Limited activity within patient's tolerance;Monitored during session;Repositioned    Home Living Family/patient expects to be discharged to:: Private residence Living Arrangements: Alone Available Help at Discharge: Friend(s);Available 24 hours/day;Personal care attendant Type of Home: House Home Access: Stairs to enter Entrance Stairs-Rails: Left Entrance Stairs-Number of Steps: 4 Home Layout: Multi-level;Able to live on main level with bedroom/bathroom Home Equipment: Gilford Rile - 2 wheels;Shower seat;Bedside commode;Grab bars - tub/shower;Hand held shower head;Other (comment)(bed rail) Additional Comments: lift recliner    Prior Function Level of Independence: Independent with assistive device(s);Needs assistance   Gait / Transfers Assistance Needed: walking with RW and aide (CGA per pt); assist to get out/in bed, sit <> stand transfers  ADL's / Homemaking Assistance Needed: has a caregiver to assist with her bathing and  housework; 24/7 assistance  Comments: Pt denies any recent falls     Hand Dominance   Dominant Hand: Right    Extremity/Trunk Assessment   Upper Extremity Assessment Upper Extremity Assessment: (grossly 4-/5)    Lower Extremity Assessment Lower Extremity Assessment: (grossly 4-/5)     Cervical / Trunk Assessment Cervical / Trunk Assessment: Normal  Communication   Communication: No difficulties  Cognition Arousal/Alertness: Awake/alert Behavior During Therapy: WFL for tasks assessed/performed Overall Cognitive Status: Within Functional Limits for tasks assessed                                        General Comments      Exercises     Assessment/Plan    PT Assessment Patient needs continued PT services  PT Problem List Decreased strength;Decreased mobility;Decreased activity tolerance;Decreased balance       PT Treatment Interventions DME instruction;Therapeutic exercise;Gait training;Balance training;Stair training;Neuromuscular re-education;Functional mobility training;Therapeutic activities;Patient/family education    PT Goals (Current goals can be found in the Care Plan section)  Acute Rehab PT Goals Patient Stated Goal: to breathe better PT Goal Formulation: With patient Time For Goal Achievement: 04/14/20 Potential to Achieve Goals: Good    Frequency Min 2X/week   Barriers to discharge        Co-evaluation               AM-PAC PT "6 Clicks" Mobility  Outcome Measure Help needed turning from your back to your side while in a flat bed without using bedrails?: A Little Help needed moving from lying on your back to sitting on the side of a flat bed without using bedrails?: A Little Help needed moving to and from a bed to a chair (including a wheelchair)?: A Little Help needed standing up from a chair using your arms (e.g., wheelchair or bedside chair)?: A Little Help needed to walk in hospital room?: A Little Help needed climbing 3-5 steps with a railing? : A Little 6 Click Score: 18    End of Session Equipment Utilized During Treatment: Gait belt;Oxygen(2L) Activity Tolerance: Patient tolerated treatment well Patient left: in chair;with chair alarm set;with call bell/phone within reach;with SCD's reapplied Nurse  Communication: Mobility status PT Visit Diagnosis: Muscle weakness (generalized) (M62.81);Difficulty in walking, not elsewhere classified (R26.2);Other abnormalities of gait and mobility (R26.89)    Time: 3716-9678 PT Time Calculation (min) (ACUTE ONLY): 29 min   Charges:   PT Evaluation $PT Eval Moderate Complexity: 1 Mod PT Treatments $Therapeutic Exercise: 23-37 mins        Lieutenant Diego PT, DPT 3:01 PM,03/31/20

## 2020-03-31 NOTE — TOC Progression Note (Signed)
Transition of Care [facility]) - Progression Note    Patient Details  Name: Phyllis [name] MRN: [id number] Date of Birth: August 27, 1946  Transition of Care [facility]) CM/SW Contact  Beverly Sessions, RN Phone Number: 03/31/2020, 10:36 AM  Clinical Narrative:    RNCM confirmed with Corene Cornea from [place] that patient is currently active with home health services for PT   Expected Discharge Plan: [place] Barriers to Discharge: Continued Medical Work up  Expected Discharge Plan and Services Expected Discharge Plan: [place] arrangements for the past 2 months: Single Family Home                                       Social Determinants of Health (SDOH) Interventions    Readmission Risk Interventions Readmission Risk Prevention Plan 03/31/2020 10/13/2019 09/26/2019  Transportation Screening Complete - Complete  Medication Review Press photographer) Complete - Complete  PCP or Specialist appointment within 3-5 days of discharge - Complete Complete  HRI or [place] - (No Data) Complete  SW Recovery Care/Counseling Consult - Complete Complete  Palliative Care Screening - Not Applicable Not [place] - Complete Not Applicable  Some recent data might be hidden

## 2020-03-31 NOTE — Progress Notes (Signed)
Established hemodialysis patient known at [facility] [address] [address]) TTS 11:45. Please contact me with any dialysis placement concerns.   Elvera Bicker Dialysis Coordinator [phone]

## 2020-04-01 ENCOUNTER — Inpatient Hospital Stay: Payer: Medicare Other

## 2020-04-01 LAB — GLUCOSE, CAPILLARY
Glucose-Capillary: 153 mg/dL — ABNORMAL HIGH (ref 70–99)
Glucose-Capillary: 103 mg/dL — ABNORMAL HIGH (ref 70–99)
Glucose-Capillary: 94 mg/dL (ref 70–99)
Glucose-Capillary: 85 mg/dL (ref 70–99)
Glucose-Capillary: 155 mg/dL — ABNORMAL HIGH (ref 70–99)

## 2020-04-01 LAB — BASIC METABOLIC PANEL
Anion gap: 13 (ref 5–15)
BUN: 56 mg/dL — ABNORMAL HIGH (ref 8–23)
CO2: 29 mmol/L (ref 22–32)
Calcium: 8.9 mg/dL (ref 8.9–10.3)
Chloride: 98 mmol/L (ref 98–111)
Creatinine, Ser: 6.45 mg/dL — ABNORMAL HIGH (ref 0.44–1.00)
GFR calc Af Amer: 7 mL/min — ABNORMAL LOW (ref >60)
GFR calc non Af Amer: 6 mL/min — ABNORMAL LOW (ref >60)
Glucose, Bld: 115 mg/dL — ABNORMAL HIGH (ref 70–99)
Potassium: 4.8 mmol/L (ref 3.5–5.1)
Sodium: 140 mmol/L (ref 135–145)

## 2020-04-01 LAB — CBC
HCT: 25 % — ABNORMAL LOW (ref 36.0–46.0)
Hemoglobin: 8.4 g/dL — ABNORMAL LOW (ref 12.0–15.0)
MCH: 29.5 pg (ref 26.0–34.0)
MCHC: 33.6 g/dL (ref 30.0–36.0)
MCV: 87.7 fL (ref 80.0–100.0)
Platelets: 131 10*3/uL — ABNORMAL LOW (ref 150–400)
RBC: 2.85 MIL/uL — ABNORMAL LOW (ref 3.87–5.11)
RDW: 14.1 % (ref 11.5–15.5)
WBC: 7.5 10*3/uL (ref 4.0–10.5)
nRBC: 0 % (ref 0.0–0.2)

## 2020-04-01 LAB — PARATHYROID HORMONE, INTACT (NO CA): PTH: 217 pg/mL — ABNORMAL HIGH (ref 15–65)

## 2020-04-01 LAB — BRAIN NATRIURETIC PEPTIDE: B Natriuretic Peptide: 1260 pg/mL — ABNORMAL HIGH (ref 0.0–100.0)

## 2020-04-01 MED ORDER — MORPHINE SULFATE (PF) 2 MG/ML IV SOLN
2.0000 mg | Freq: Once | INTRAVENOUS | Status: AC
  Administered 2020-04-01: 2 mg via INTRAVENOUS
  Filled 2020-04-01: qty 1

## 2020-04-01 MED ORDER — NITROGLYCERIN IN D5W 200-5 MCG/ML-% IV SOLN: 80.0000 ug/min | INTRAVENOUS | Status: DC

## 2020-04-01 MED ORDER — CHLORHEXIDINE GLUCONATE CLOTH 2 % EX PADS
6.0000 | MEDICATED_PAD | Freq: Every day | CUTANEOUS | Status: DC
  Administered 2020-04-01: 6 via TOPICAL

## 2020-04-01 NOTE — Progress Notes (Signed)
Pt transferred from  @A  to ICU C/O respiratory distress and increase in BP, pt required BiPAP and was already on BiPAP when she arrived to ICU, recheck of BP was WNL and NP was notified and will hold off on nitro gtt for now. Pt was made comfortable and oriented to call light and bed. Pt is alert and oriented and able to communicated  needs and  ADL's.  Call light at rich and bed to lowest position.Marland Kitchen

## 2020-04-01 NOTE — Progress Notes (Signed)
[place] waved into rm. by supervisor of pt.'s caregivers @ bedside; supervisor said she was just about to pray for pt. when [facility] walked by --> requested prayer for 'total healing' for pt.  CH offered prayer for healing and wholeness for pt.  Pt.'s caregiver also @ bedside; pt. says she has several caregivers who rotate to provide care.  Pt. experiencing fluid buildup that affects heart and lungs --> said she feels 'worried' about the possibility of feeling short of breath again.  CH provided supportive listening and made pt. aware of availability if needed.  Will follow up this PM if possible.

## 2020-04-01 NOTE — Progress Notes (Signed)
Phyllis [name]  MRN: [id number]  DOB/AGE: 1946-01-07 74 y.o.  Primary Care Physician:Hande, Cherlyn Labella, MD  Admit date: 03/29/2020  Chief Complaint:  Chief Complaint  Patient presents with  . Respiratory Distress    S-Pt presented on  03/29/2020 with  Chief Complaint  Patient presents with  . Respiratory Distress  . Patient laying comfortably in the bed. Patient voices no specific concerns. Patient is now says "my breathing is better." Patient main concern today was"  I am waiting for my dialysis"   . amLODipine  10 mg Oral Daily  . aspirin  81 mg Oral Daily  . calcium acetate  1,334 mg Oral TID WC  . carvedilol  12.5 mg Oral BID WC  . Chlorhexidine Gluconate Cloth  6 each Topical Daily  . clopidogrel  75 mg Oral Daily  . epoetin (EPOGEN/PROCRIT) injection  4,000 Units Intravenous Q T,Th,Sa-HD  . furosemide  80 mg Intravenous BID  . hydrALAZINE  50 mg Oral Q8H  . insulin aspart  0-5 Units Subcutaneous QHS  . insulin aspart  0-6 Units Subcutaneous TID WC  . iron polysaccharides  150 mg Oral Daily  . lidocaine  1 patch Transdermal Q24H  . loratadine  10 mg Oral Daily  . losartan  50 mg Oral Daily  . mirtazapine  15 mg Oral QHS  . oxybutynin  15 mg Oral Daily  . pravastatin  40 mg Oral q1800  . sertraline  50 mg Oral QHS  . sodium chloride flush  3 mL Intravenous Q12H         KCL:EXNTZ from the symptoms mentioned above,there are no other symptoms referable to all systems reviewed.  Physical Exam: Vital signs in last 24 hours: Temp:  [97.6 F (36.4 C)-98.5 F (36.9 C)] 97.6 F (36.4 C) (05/13 0800) Pulse Rate:  [60-128] 71 (05/13 1100) Resp:  [12-36] 15 (05/13 1100) BP: (120-192)/(39-93) 145/59 (05/13 1100) SpO2:  [90 %-100 %] 96 % (05/13 1100) FiO2 (%):  [40 %] 40 % (05/13 0800) Weight:  [56.8 kg] 56.8 kg (05/13 0100) Weight change: -0.5 kg Last BM Date: 03/30/20  Intake/Output from previous day: 05/12 0701 - 05/13 0700 In: 603 [P.O.:600;  I.V.:3] Out: 200 [Urine:200] No intake/output data recorded.   Physical Exam: General- pt is awake,alert, oriented to time place and person Resp- No acute REsp distress, CTA B/L NO Rhonchi CVS- S1S2 regular in rate and rhythm GIT- BS+, soft, NT, ND EXT- NO LE Edema, Cyanosis Access patient has left AV fistula which is positive bruit and thrill  Lab Results: CBC Recent Labs    03/31/20 0527 04/01/20 0517  WBC 4.2 7.5  HGB 7.8* 8.4*  HCT 23.7* 25.0*  PLT 117* 131*    BMET Recent Labs    03/31/20 0527 04/01/20 0517  NA 141 140  K 4.3 4.8  CL 97* 98  CO2 33* 29  GLUCOSE 83 115*  BUN 41* 56*  CREATININE 4.80* 6.45*  CALCIUM 8.2* 8.9    MICRO Recent Results (from the past 240 hour(s))  SARS Coronavirus 2 by RT PCR (hospital order, performed in [facility] hospital lab) Nasopharyngeal Nasopharyngeal Swab     Status: None   Collection Time: 03/29/20 10:15 PM   Specimen: Nasopharyngeal Swab  Result Value Ref Range Status   SARS Coronavirus 2 NEGATIVE NEGATIVE Final    Comment: (NOTE) SARS-CoV-2 target nucleic acids are NOT DETECTED. The SARS-CoV-2 RNA is generally detectable in upper and lower respiratory specimens during the acute phase of  infection. The lowest concentration of SARS-CoV-2 viral copies this assay can detect is 250 copies / mL. A negative result does not preclude SARS-CoV-2 infection and should not be used as the sole basis for treatment or other patient management decisions.  A negative result may occur with improper specimen collection / handling, submission of specimen other than nasopharyngeal swab, presence of viral mutation(s) within the areas targeted by this assay, and inadequate number of viral copies (<250 copies / mL). A negative result must be combined with clinical observations, patient history, and epidemiological information. Fact Sheet for Patients:   StrictlyIdeas.no Fact Sheet for Healthcare  Providers: BankingDealers.co.za This test is not yet approved or cleared  by the [place] FDA and has been authorized for detection and/or diagnosis of SARS-CoV-2 by FDA under an Emergency Use Authorization (EUA).  This EUA will remain in effect (meaning this test can be used) for the duration of the COVID-19 declaration under Section 564(b)(1) of the Act, 21 U.S.C. section 360bbb-3(b)(1), unless the authorization is terminated or revoked sooner. Performed at [facility], [address] [place], [place] [address]       Lab Results  Component Value Date   PTH 217 (H) 03/30/2020   CALCIUM 8.9 04/01/2020   CAION 1.10 (L) 10/14/2019   PHOS 3.0 03/30/2020               Impression:   Patient is a 74 year old Caucasian female with a past medical history of end-stage renal disease on hemodialysis-on Tuesday Thursday Saturday schedule, aortic stenosis, hypertension, COPD, diabetes mellitus type 2, overactive bladder, gout, GERD, costochondritis who was admitted to the hospital with chief complaint of shortness of breath and uncontrolled hypertension   1)Renal ESRD Patient is on hemodialysis. Patient is on Tuesday Thursday Saturday schedule. Patient is under the care of [facility]-patient undergoes dialysis at [facility] center located at [facility]. Patient will be dialyzed today  2)HTN Patient blood pressure is well controlled   3)Anemia of chronic disease  HGb is not at goal (9--11) Hemoglobin is improving  4) secondary hyperparathyroidism -CKD Mineral-Bone Disorder   Secondary Hyperparathyroidism  present. Phosphorus at goal.   5) pulmonary edema Patient was admitted with fluid overload/pulmonary edema Patient is now clinically better after being dialyzed.   6) electrolytes   sodium Normonatremic   potassium Normokalemic    7)Acid base Co2 at goal     Plan:  We will dialyze patient today We will keep  patient on Epogen We will try to remove 2 L in an effort to help with patient's volume status     Chalmer Zheng s [facility] 04/01/2020, 1:29 PM

## 2020-04-01 NOTE — Progress Notes (Signed)
Patient being transferred to ICU 8.

## 2020-04-01 NOTE — Progress Notes (Signed)
PROGRESS NOTE    Phyllis [name]  RKY:[id number] DOB: 1946/03/21 DOA: 03/29/2020 PCP: [name], MD    Brief Narrative:  Patient admitted overnight with acute respiratory failure with hypoxia secondary to volume overload and flash pulmonary edema.  She required bipap initially, has since been weaned off after receiving dialysis.  Interval subjective: Patient seen this AM while still in the ED on bipap.  Resting comfortably without distress and stable vitals.  O2 sat 100% on bipap at FiO2 35%.    Consultants:  nephrology  Procedures: HD  Antimicrobials:       Subjective: Overnight patient was in respiratory distress and was transferred to stepdown unit and was placed on BiPAP. Now off of oxygen. She does report feeling much better. Getting dialysis.  Objective: Vitals:   04/01/20 1000 04/01/20 1100 04/01/20 1200 04/01/20 1300  BP: (!) 152/49 (!) 145/59 (!) 153/74 (!) 141/43  Pulse: 72 71 80 73  Resp: 14 15 (!) 21 16  Temp:      TempSrc:      SpO2: 92% 96% 96% 94%  Weight:      Height:        Intake/Output Summary (Last 24 hours) at 04/01/2020 1524 Last data filed at 04/01/2020 1330 Gross per 24 hour  Intake 360 ml  Output 200 ml  Net 160 ml   Filed Weights   03/31/20 0500 03/31/20 0612 04/01/20 0100  Weight: 57.3 kg 57.3 kg 56.8 kg    Examination:  General exam: Appears calm and comfortable, NAD Respiratory system:b/l fine crackles, no wheezing Cardiovascular system: S1 & S2 heard, RRR. No JVD, 2/6 sm Gastrointestinal system: Abdomen is nondistended, soft and nontender. Normal bowel sounds heard. Central nervous system: Alert and oriented. Grossly intact Extremities: No edema Skin: Warm dry Psychiatry:  Mood & affect appropriate in current setting.     Data Reviewed: I have personally reviewed following labs and imaging studies  CBC: Recent Labs  Lab 03/29/20 2214 03/30/20 1110 03/31/20 0527 04/01/20 0517  WBC 8.7 5.0 4.2 7.5    NEUTROABS 6.7 3.8  --   --   HGB 9.8* 8.4* 7.8* 8.4*  HCT 29.7* 25.3* 23.7* 25.0*  MCV 88.9 87.8 89.1 87.7  PLT 177 133* 117* 315*   Basic Metabolic Panel: Recent Labs  Lab 03/29/20 2214 03/30/20 1804 03/31/20 0527 04/01/20 0517  NA 140  --  141 140  K 5.0  --  4.3 4.8  CL 100  --  97* 98  CO2 24  --  33* 29  GLUCOSE 186*  --  83 115*  BUN 74*  --  41* 56*  CREATININE 7.45*  --  4.80* 6.45*  CALCIUM 8.0*  --  8.2* 8.9  PHOS  --  3.0  --   --    GFR: Estimated Creatinine Clearance: 5.8 mL/min (A) (by C-G formula based on SCr of 6.45 mg/dL (H)). Liver Function Tests: No results for input(s): AST, ALT, ALKPHOS, BILITOT, PROT, ALBUMIN in the last 168 hours. No results for input(s): LIPASE, AMYLASE in the last 168 hours. No results for input(s): AMMONIA in the last 168 hours. Coagulation Profile: No results for input(s): INR, PROTIME in the last 168 hours. Cardiac Enzymes: No results for input(s): CKTOTAL, CKMB, CKMBINDEX, TROPONINI in the last 168 hours. BNP (last 3 results) No results for input(s): PROBNP in the last 8760 hours. HbA1C: Recent Labs    03/30/20 0033  HGBA1C 5.1   CBG: Recent Labs  Lab 03/31/20 1627 03/31/20  2136 04/01/20 0116 04/01/20 0738 04/01/20 1129  GLUCAP 86 93 153* 103* 94   Lipid Profile: No results for input(s): CHOL, HDL, LDLCALC, TRIG, CHOLHDL, LDLDIRECT in the last 72 hours. Thyroid Function Tests: No results for input(s): TSH, T4TOTAL, FREET4, T3FREE, THYROIDAB in the last 72 hours. Anemia Panel: No results for input(s): VITAMINB12, FOLATE, FERRITIN, TIBC, IRON, RETICCTPCT in the last 72 hours. Sepsis Labs: No results for input(s): PROCALCITON, LATICACIDVEN in the last 168 hours.  Recent Results (from the past 240 hour(s))  SARS Coronavirus 2 by RT PCR (hospital order, performed in [facility] hospital lab) Nasopharyngeal Nasopharyngeal Swab     Status: None   Collection Time: 03/29/20 10:15 PM   Specimen: Nasopharyngeal Swab   Result Value Ref Range Status   SARS Coronavirus 2 NEGATIVE NEGATIVE Final    Comment: (NOTE) SARS-CoV-2 target nucleic acids are NOT DETECTED. The SARS-CoV-2 RNA is generally detectable in upper and lower respiratory specimens during the acute phase of infection. The lowest concentration of SARS-CoV-2 viral copies this assay can detect is 250 copies / mL. A negative result does not preclude SARS-CoV-2 infection and should not be used as the sole basis for treatment or other patient management decisions.  A negative result may occur with improper specimen collection / handling, submission of specimen other than nasopharyngeal swab, presence of viral mutation(s) within the areas targeted by this assay, and inadequate number of viral copies (<250 copies / mL). A negative result must be combined with clinical observations, patient history, and epidemiological information. Fact Sheet for Patients:   StrictlyIdeas.no Fact Sheet for Healthcare Providers: BankingDealers.co.za This test is not yet approved or cleared  by the [place] FDA and has been authorized for detection and/or diagnosis of SARS-CoV-2 by FDA under an Emergency Use Authorization (EUA).  This EUA will remain in effect (meaning this test can be used) for the duration of the COVID-19 declaration under Section 564(b)(1) of the Act, 21 U.S.C. section 360bbb-3(b)(1), unless the authorization is terminated or revoked sooner. Performed at [facility], [address], [place], [place] [address]          Radiology Studies: DG Chest 1 View  Result Date: 04/01/2020 CLINICAL DATA:  Shortness of breath EXAM: CHEST  1 VIEW COMPARISON:  Mar 29, 2020 FINDINGS: The heart size and mediastinal contours are unchanged with mild cardiomegaly. Aortic knob calcifications. Again noted are multifocal airspace opacities and increased interstitial markings throughout both lungs as  on the prior exam. There is slight interval worsening in the right upper lung airspace opacities. Pleural calcifications seen in the right upper lung. IMPRESSION: Multifocal airspace opacities throughout both lungs with slight interval worsening in the right upper lung which could be due to multifocal pneumonia or asymmetric edema with superimposed chronic lung changes. Electronically Signed   By: Prudencio Pair M.D.   On: 04/01/2020 00:20        Scheduled Meds: . amLODipine  10 mg Oral Daily  . aspirin  81 mg Oral Daily  . calcium acetate  1,334 mg Oral TID WC  . carvedilol  12.5 mg Oral BID WC  . Chlorhexidine Gluconate Cloth  6 each Topical Daily  . clopidogrel  75 mg Oral Daily  . epoetin (EPOGEN/PROCRIT) injection  4,000 Units Intravenous Q T,Th,Sa-HD  . furosemide  80 mg Intravenous BID  . hydrALAZINE  50 mg Oral Q8H  . insulin aspart  0-5 Units Subcutaneous QHS  . insulin aspart  0-6 Units Subcutaneous TID WC  .  iron polysaccharides  150 mg Oral Daily  . lidocaine  1 patch Transdermal Q24H  . loratadine  10 mg Oral Daily  . losartan  50 mg Oral Daily  . mirtazapine  15 mg Oral QHS  . oxybutynin  15 mg Oral Daily  . pravastatin  40 mg Oral q1800  . sertraline  50 mg Oral QHS  . sodium chloride flush  3 mL Intravenous Q12H   Continuous Infusions: . sodium chloride    . nitroGLYCERIN Stopped (04/01/20 0425)    Assessment & Plan:   Principal Problem:   Acute on chronic respiratory failure with hypoxemia (HCC) Active Problems:   ESRD (end stage renal disease) on dialysis ([place])   Type 2 diabetes mellitus with other specified complication (HCC)   Acute on chronic diastolic CHF (congestive heart failure) (HCC)   HTN (hypertension)   Anxiety   #1 acute respiratory failure with hypoxia: Suspected flash pulmonary edema.   continue IV Lasix 80 mg twice a day until patient is dialyzed.  Continue oxygenation and supportive care.   Off bipap now. BNP elevated HD today, will f/u  with nephrology  #2 diabetes: Continue home regimen with sliding scale insulin.  #3 hypertension: Resume home regimen with additional Lasix as above.  #4 end-stage renal disease: Hemodialysis on Tuesdays Thursdays and Saturdays.  Due for hemodialysis first in the morning.  Continue per nephrology.  #5 anxiety disorder: Confirm and resume home regimen.  #6 diastolic heart failure: Previous echo showed EF of 60%.  Continue treatment as per #1 above.  #6 anemia of chronic kidney disease Started on Epogen 4000 units IV with dialysis treatments  #7Thrombocytopenia- etiology unclear. Will monitor. D/c'd heparin sq. Does get hepain during HD. Today Plt improving Continue to monitor   DVT prophylaxis: scd Code Status: Full code Family Communication: No family currently at bedside, sitter at bedside Disposition Plan: Home Barrier: needs iv lasix, needs HD as she is still volume overloaded, needs to be more stable without having respiratory distress. Possible d/c in 1-2 days if medically more stable.    LOS: 3 days   Time spent: 45 min with >50 on coc    Nolberto Hanlon, MD [facility] Pager [phone]  If 7PM-7AM, please contact night-coverage [email or web address] Password [facility] 04/01/2020, 3:24 PM Patient ID: Phyllis [name], female   DOB: [birth year], 74 y.o.   MRN: [id number]

## 2020-04-01 NOTE — Progress Notes (Signed)
    BRIEF OVERNIGHT PROGRESS REPORT   SUBJECTIVE: Per primary RN and RRT, patient appeared to be in respiratory distress.  RN reports increased work of breathing, shortness of breath, and diaphoresis. SPO2 was 92% on 4 L.  OBJECTIVE:On arrival to the bedside,  she was afebrile with blood pressure 194/93 mm Hg and pulse rate 128 beats/min. There were no focal neurological deficits; she was alert and oriented x4, noted to be short of breath.  Breath sounds reveal coarse rales bilaterally.  BRIEF PATIENT DESCRIPTION: Admitted with acute respiratory failure with hypoxia secondary to volume overload and flash pulmonary edema.  She required BiPAP initially, was weaned off but late in the evening on 5/12 became increasingly SOB with increased work of breathing.Transferred to stepdown unit for BiPAP.  ASSESSMENT/PLAN:  1. Acute Hypoxic Respiratory Failure secondary to volume overload and flash pulmonary edema in a patient with Hx: HFpEF, Obstructive Sleep Apnea, COPD without evidence of acute exacerbation and ESRD on HD (Last HD on 05/11) -Transfer to stepdown -Supplemental O2 as needed to maintain O2 saturations 88 to 92% -BiPAP, wean as tolerated -High risk for intubation -Follow intermittent ABG and chest x-ray as needed -Repeat CXR on 5/12 shows worsening airspace opacities -IV Lasix as blood pressure and renal function permits; currently on Lasix 80 mg IV BID -As needed bronchodilators -I considered nitroglycerin drip however given hx of aortic stenosis will be very cautious if have to use.  2. Chronic HFpEF (last known EF 60 to 65% on 6/20) -Hypertension -Continuous cardiac monitoring -Maintain MAP greater than 65 -IV Lasix as blood pressure and renal function permits -Continue Norvasc, Coreg and hydralazine -Repeat 2D Echocardiogram -Consider cardiology consult  3. ESRD on HD TTS -Monitor I&O's / urinary output -Follow BMP -Avoid nephrotoxic agents as able -Replace  electrolytes as indicated -Nephrology following    Rufina Falco, DNP, CCRN, APRN, FNP-C [facility] Nurse Practitioner Between 7pm to [place] - Pager [phone]  After 7am go to [email or web address] - password:[email or web address]  [facility] [employer]  [phone]

## 2020-04-01 NOTE — Progress Notes (Signed)
PT Cancellation Note  Patient Details Name: Phyllis [name] MRN: [id number] DOB: 06/24/1946   Cancelled Treatment:    Reason Eval/Treat Not Completed: Other (comment)(PT orders completed at this time due to change in patient status; transferred to stepdown unit for respiratory distress. Please re-consult PT when patient is medically appropriate.)  Lieutenant Diego PT, DPT 8:37 AM,04/01/20

## 2020-04-01 NOTE — Progress Notes (Signed)
Patient became increasingly short of breath with expiratory wheezes that progressed to crackles with fluid overload. Called respiratory and NP Ouma. Patient was placed on bipap and chest xray was ordered. Patient's heart rate decreased from 129 to 101 but respirations are 36, which puts her at a mews 4. Patient will be going to step down for further monitoring.  Christene Slates

## 2020-04-01 NOTE — Progress Notes (Signed)
Called to assess patient in respiratory distress. Upon arrival, patient was noted to be short of breath, diaphoretic, and dyspneic. SpO2 was 92% on 4lpm. Breath sounds reveal coarse rales throughout. NP notified. Bipap started per order 15/8 r-12 40%. Patient appears to be slightly more comfortable on bipap. Will continue to monitor.

## 2020-04-02 ENCOUNTER — Other Ambulatory Visit: Payer: Self-pay | Admitting: Cardiovascular Disease

## 2020-04-02 LAB — BASIC METABOLIC PANEL
Anion gap: 10 (ref 5–15)
BUN: 27 mg/dL — ABNORMAL HIGH (ref 8–23)
CO2: 29 mmol/L (ref 22–32)
Calcium: 8.8 mg/dL — ABNORMAL LOW (ref 8.9–10.3)
Chloride: 100 mmol/L (ref 98–111)
Creatinine, Ser: 4.36 mg/dL — ABNORMAL HIGH (ref 0.44–1.00)
GFR calc Af Amer: 11 mL/min — ABNORMAL LOW (ref >60)
GFR calc non Af Amer: 9 mL/min — ABNORMAL LOW (ref >60)
Glucose, Bld: 84 mg/dL (ref 70–99)
Potassium: 4.6 mmol/L (ref 3.5–5.1)
Sodium: 139 mmol/L (ref 135–145)

## 2020-04-02 LAB — GLUCOSE, CAPILLARY
Glucose-Capillary: 81 mg/dL (ref 70–99)
Glucose-Capillary: 129 mg/dL — ABNORMAL HIGH (ref 70–99)

## 2020-04-02 LAB — BRAIN NATRIURETIC PEPTIDE: B Natriuretic Peptide: 1135 pg/mL — ABNORMAL HIGH (ref 0.0–100.0)

## 2020-04-02 MED ORDER — EPOETIN ALFA 10000 UNIT/ML IJ SOLN: 4000.0000 [IU] | INTRAMUSCULAR | Status: AC

## 2020-04-02 MED ORDER — FUROSEMIDE 80 MG PO TABS
80.0000 mg | ORAL_TABLET | Freq: Every day | ORAL | 11 refills | Status: DC
Start: 2020-04-02 — End: 2020-10-11

## 2020-04-02 NOTE — Progress Notes (Signed)
Physical Therapy Re-Evaluation Patient Details Name: Phyllis [name] MRN: [id number] DOB: Feb 11, 1946 Today's Date: 04/02/2020    History of Present Illness Pt is a 74 yo female that prestend to ED for SOB, initially placed on bipap. Admitted for acute on respiratory failure with hypoxia secondary to volume overload and flash pulmonary edema. PMH of CHF, anxiety, asthma, chronic back pain, COPD, DM, ESRD on HD, HTN, GERD, chronic costochrondritis.    PT Comments    Pt seen for re-evaulation this date. Pt alert, agreeable to PT, reported low back pain, at start of session. Pt oriented x4. Supine to sit with HOB elevated and minA for trunk elevation. Sit <> stand with RW and minA to clear buttocks from bed and initial standing balance. The patient ambulated ~24ft with RW and CGA, did report leg weakness but no SOB, increased work of breathing noted, spO2 on room air >94%. Pt with occasional gait path deviations to L, but able to self correct. Pt up in chair, all needs in reach. The patient would benefit from further skilled PT interventions to maximize independence, mobility, and activity tolerance/endurance, current recommendation remains appropriate.     Follow Up Recommendations  Home health PT     Equipment Recommendations  None recommended by PT    Recommendations for Other Services       Precautions / Restrictions Precautions Precautions: Fall Restrictions Weight Bearing Restrictions: No    Mobility  Bed Mobility Overal bed mobility: Needs Assistance       Supine to sit: Min assist;HOB elevated        Transfers Overall transfer level: Needs assistance Equipment used: Rolling walker (2 wheeled) Transfers: Sit to/from Stand Sit to Stand: Min assist            Ambulation/Gait Ambulation/Gait assistance: Min guard Gait Distance (Feet): 80 Feet Assistive device: Rolling walker (2 wheeled)       General Gait Details: CGA, occasionally gait deviation to the L,  but pt able to self correct and navigate around obstacles well   Stairs             Wheelchair Mobility    Modified Rankin (Stroke Patients Only)       Balance Overall balance assessment: Needs assistance Sitting-balance support: Feet supported Sitting balance-Leahy Scale: Good       Standing balance-Leahy Scale: Good                              Cognition Arousal/Alertness: Awake/alert Behavior During Therapy: WFL for tasks assessed/performed Overall Cognitive Status: Within Functional Limits for tasks assessed                                        Exercises      General Comments        Pertinent Vitals/Pain Pain Assessment: Faces Pain Location: chronic back pain Pain Descriptors / Indicators: Aching Pain Intervention(s): Limited activity within patient's tolerance;Monitored during session;Repositioned    Home Living                      Prior Function            PT Goals (current goals can now be found in the care plan section) Progress towards PT goals: Progressing toward goals    Frequency    Min 2X/week  PT Plan Current plan remains appropriate    Co-evaluation              AM-PAC PT "6 Clicks" Mobility   Outcome Measure  Help needed turning from your back to your side while in a flat bed without using bedrails?: A Little Help needed moving from lying on your back to sitting on the side of a flat bed without using bedrails?: A Little Help needed moving to and from a bed to a chair (including a wheelchair)?: A Little Help needed standing up from a chair using your arms (e.g., wheelchair or bedside chair)?: A Little Help needed to walk in hospital room?: A Little Help needed climbing 3-5 steps with a railing? : A Little 6 Click Score: 18    End of Session Equipment Utilized During Treatment: Gait belt Activity Tolerance: Patient tolerated treatment well Patient left: in chair;with  call bell/phone within reach;with SCD's reapplied Nurse Communication: Mobility status PT Visit Diagnosis: Muscle weakness (generalized) (M62.81);Difficulty in walking, not elsewhere classified (R26.2);Other abnormalities of gait and mobility (R26.89)     Time: 3614-4315 PT Time Calculation (min) (ACUTE ONLY): 25 min  Charges:  $Therapeutic Exercise: 23-37 mins                     Lieutenant Diego PT, DPT 9:36 AM,04/02/20

## 2020-04-02 NOTE — Progress Notes (Signed)
bipap order now prn. Patient resting well on nasal cannula. bipap on standby. Will continue to monitor

## 2020-04-02 NOTE — Evaluation (Signed)
Occupational Therapy Evaluation Patient Details Name: Phyllis [name] MRN: [id number] DOB: [birth year] Today's Date: 04/02/2020    History of Present Illness Pt is a 74 yo female that prestend to ED for SOB, initially placed on bipap. Admitted for acute on respiratory failure with hypoxia secondary to volume overload and flash pulmonary edema. PMH of CHF, anxiety, asthma, chronic back pain, COPD, DM, ESRD on HD, HTN, GERD, chronic costochrondritis.   Clinical Impression   [name] was seen for OT evaluation this date. Prior to hospital admission, pt lived alone c 24/7 caregivers who provide assist for bathing/LBD, and IADLs. Pt presents to acute OT demonstrating impaired ADL performance and functional mobility 2/2 decreased functional strength/ROM/balance and decreased activity tolerance. Pt currently requires MIN A don/doff B socks - assist for threading. CGA + RW simulated BSC t/f c MIN A to correct minor posterior.  Pt would benefit from skilled OT to address noted impairments and functional limitations (see below for any additional details) in order to maximize safety and independence while minimizing falls risk and caregiver burden. Upon hospital discharge, recommend HHOT to maximize pt safety and return to functional independence during meaningful occupations of daily life.     Follow Up Recommendations  Home health OT    Equipment Recommendations  None recommended by OT    Recommendations for Other Services       Precautions / Restrictions Precautions Precautions: Fall Restrictions Weight Bearing Restrictions: No      Mobility Bed Mobility Overal bed mobility: Needs Assistance       Supine to sit: Min assist;HOB elevated     General bed mobility comments: Pt received and left up in chair  Transfers Overall transfer level: Needs assistance Equipment used: Rolling walker (2 wheeled) Transfers: Sit to/from [facility] Sit to Stand: Min  assist Stand pivot transfers: Min assist            Balance Overall balance assessment: Needs assistance Sitting-balance support: Feet supported Sitting balance-Leahy Scale: Good     Standing balance support: Bilateral upper extremity supported Standing balance-Leahy Scale: Fair Standing balance comment: Minor posterior LOB required MIN A to correct                            ADL either performed or assessed with clinical judgement   ADL Overall ADL's : Needs assistance/impaired                                       General ADL Comments: MIN A don/doff B socks - assist for threading. CGA + RW simulated BSC t/f c MIN A to correct minor posterior LOB     Vision Baseline Vision/History: Wears glasses Wears Glasses: At all times       Perception     Praxis      Pertinent Vitals/Pain Pain Assessment: No/denies pain Pain Location: chronic back pain Pain Descriptors / Indicators: Aching Pain Intervention(s): Limited activity within patient's tolerance;Monitored during session;Repositioned     Hand Dominance Right   Extremity/Trunk Assessment Upper Extremity Assessment Upper Extremity Assessment: Generalized weakness   Lower Extremity Assessment Lower Extremity Assessment: Generalized weakness       Communication Communication Communication: No difficulties   Cognition Arousal/Alertness: Awake/alert Behavior During Therapy: WFL for tasks assessed/performed Overall Cognitive Status: Within Functional Limits for tasks assessed  General Comments  SpO2 stable t/o     Exercises Exercises: Other exercises Other Exercises Other Exercises: Pt and caregiver educated re: d/c recs, DME recs, falls prevention, energy conservation, RW technique Other Exercises: LBD, toilet t/f, sit<>stand, SPT   Shoulder Instructions      Home Living Family/patient expects to be discharged to:: Private  residence Living Arrangements: Alone Available Help at Discharge: Friend(s);Available 24 hours/day;Personal care attendant Type of Home: House Home Access: Stairs to enter [employer] of Steps: 4 Entrance Stairs-Rails: Left Home Layout: Multi-level;Able to live on main level with bedroom/bathroom     Bathroom Shower/Tub: Walk-in shower;Door   [employer] Toilet: Standard     Home Equipment: Environmental consultant - 2 wheels;Shower seat;Bedside commode;Grab bars - tub/shower;Hand held shower head;Other (comment)(bed rail)   Additional Comments: lift recliner      Prior Functioning/Environment Level of Independence: Independent with assistive device(s);Needs assistance  Gait / Transfers Assistance Needed: Caregiver reports assist for t/fs ADL's / Homemaking Assistance Needed: has a caregiver to assist with her bathing and housework; 24/7 assistance. Bakes c SETUP/MIN A             OT Problem List: Decreased strength;Decreased activity tolerance;Impaired balance (sitting and/or standing)      OT Treatment/Interventions: Self-care/ADL training;Therapeutic exercise;Energy conservation;DME and/or AE instruction;Therapeutic activities;Patient/family education;Balance training    OT Goals(Current goals can be found in the care plan section) Acute Rehab OT Goals Patient Stated Goal: to breathe better OT Goal Formulation: With patient/family Time For Goal Achievement: 04/16/20 Potential to Achieve Goals: Good ADL Goals Pt Will Perform Lower Body Dressing: with modified independence(c AE & LRAD PRN) Pt Will Transfer to Toilet: with supervision;ambulating;regular height toilet(c LRAD PRN) Pt Will Perform Toileting - Clothing Manipulation and hygiene: with supervision;sit to/from stand(c LRAD PRN)  OT Frequency: Min 1X/week   Barriers to D/C: Inaccessible home environment          Co-evaluation              AM-PAC OT "6 Clicks" Daily Activity     Outcome Measure Help from  another person eating meals?: None Help from another person taking care of personal grooming?: None Help from another person toileting, which includes using toliet, bedpan, or urinal?: A Little Help from another person bathing (including washing, rinsing, drying)?: A Little Help from another person to put on and taking off regular upper body clothing?: None Help from another person to put on and taking off regular lower body clothing?: A Little 6 Click Score: 21   End of Session Equipment Utilized During Treatment: Rolling walker  Activity Tolerance: Patient tolerated treatment well Patient left: in chair;with call bell/phone within reach;with chair alarm set;with family/visitor present  OT Visit Diagnosis: Unsteadiness on feet (R26.81);Other abnormalities of gait and mobility (R26.89)                Time: 9622-2979 OT Time Calculation (min): 10 min Charges:  OT General Charges $OT Visit: 1 Visit OT Evaluation $OT Eval Low Complexity: 1 Low  Dessie Coma, M.S. OTR/L  04/02/20, 12:26 PM

## 2020-04-02 NOTE — Progress Notes (Signed)
Pt in no acute distress at this time. A&OX4. VSS. Able to ambulate with PT on room air. Discharge instructions explained to pt and pt verbalized understanding. IV removed. Assisted to dress and wheeled to [place] entrance, assisted into private vehicle.

## 2020-04-02 NOTE — Discharge Summary (Signed)
Phyllis [name] WUJ:[id number] DOB: [birth year] DOA: 03/29/2020  PCP: [name], MD  Admit date: 03/29/2020 Discharge date: 04/02/2020  Admitted From: Home Disposition: Home  Recommendations for Outpatient Follow-up:  1. Follow up with PCP in 1 week 2. Please obtain BMP/CBC in one week 3. Follow-up with hemodialysis as previously scheduled  Home Health: Nursing and PT   Discharge Condition:Stable CODE STATUS: Full Diet recommendation: Heart Healthy , low sodium Brief/Interim Summary: Phyllis [name] is a 74 y.o. female with medical history significant of end-stage renal disease on hemodialysis on Tuesdays Thursdays and Saturdays, diabetes, COPD, asthma, GERD, morbid obesity, anxiety with depression, diastolic dysfunction CHF and hyperlipidemia with severe aortic stenosis who presented to the ER in acute respiratory failure.  Patient had her dialysis on Saturday and was doing fine.  She did eat salty food afterward and on the day of admission she had suddenly became hypoxic having significant shortness of breath cough and EMS brought patient to the ER.  Patient was found to be in acute respiratory failure with flash pulmonary edema.  Her blood pressure is elevated but not in the malignant range.    Patient was started on IV Lasix per nephrology.  Nephrology was consulted.  Initially she was on BiPAP and transition to room air.  She had hemodialysis while inpatient.  She had physical therapy today who recommended home PT.  While walking she was satting more than 94% on room air.  She feels much better.  She was counseled extensively on low-sodium diet and avoiding fried food, canned food.  #1 acute respiratory failure with hypoxia:Suspected flash pulmonary edema. likely from dietary indiscretion/ high salt content in food. Switch iv Lasix 80 mg twice a day to daily per nephrology  Off bipap now on rm air.  #2 diabetes:continue home meds on discharge  #3 hypertension:Resume  home regimen with additional Lasix as above.  #4 end-stage renal disease:Hemodialysis on Tuesdays Thursdays and Saturdays.  Continue per nephrology.  #5 anxiety disorder:Confirm and resume home regimen.  #6 diastolic heart failure:Previous echo showed EF of 60%. Continue treatment as per #1 above.  #6 anemia of chronic kidney disease Started on Epogen 4000 units IV with dialysis treatments  #7Thrombocytopenia- etiology unclear. Will monitor. D/c'd heparin sq. Does get hepain during HD. Improving.   Discharge Diagnoses:  Principal Problem:   Acute on chronic respiratory failure with hypoxemia (HCC) Active Problems:   ESRD (end stage renal disease) on dialysis (HCC)   Type 2 diabetes mellitus with other specified complication (HCC)   Acute on chronic diastolic CHF (congestive heart failure) (HCC)   HTN (hypertension)   Anxiety    Discharge Instructions  Discharge Instructions    Call MD for:  difficulty breathing, headache or visual disturbances   Complete by: As directed    Call MD for:  temperature >100.4   Complete by: As directed    Diet - low sodium heart healthy   Complete by: As directed    Discharge instructions   Complete by: As directed    Low sodium diet, avoid can foods, eating out, fried foods. F/u with pcp in one week F/u with dialysis as planned usually   Increase activity slowly   Complete by: As directed      Allergies as of 04/02/2020      Reactions   Enalapril Maleate Other (See Comments)   Other reaction(s): Headache   Nitrofurantoin Swelling, Rash   Other Reaction: swelling of body   Sulfamethoxazole-trimethoprim Swelling   2,4-d  Dimethylamine (amisol) Rash, Other (See Comments)   Other Reaction: h/a   Baclofen Other (See Comments), Nausea Only   lightheadness ,drowsiness , muscle weakness , twitching in hands    Neosporin [neomycin-bacitracin Zn-polymyx] Other (See Comments), Rash   Other Reaction: irritation Skin irritation    Quinine Nausea And Vomiting, Rash, Other (See Comments)   Other Reaction: Vomiting, rash, h/a, vision   Ultram [tramadol] Palpitations   Zocor [simvastatin] Other (See Comments), Rash   Other Reaction: muscle spasms Muscle pain and spasms   Bactrim [sulfamethoxazole-trimethoprim] Swelling   Levodopa Other (See Comments)   Reaction: unknown   Macrodantin [nitrofurantoin Macrocrystal] Swelling   Quinine Derivatives Other (See Comments)   Vertigo,nausea vomiting blurred vision headache ears sensitivity      Medication List    TAKE these medications   acetaminophen 500 MG tablet Commonly known as: TYLENOL Take 1 tablet (500 mg total) by mouth every 6 (six) hours as needed for mild pain or fever.   albuterol 108 (90 Base) MCG/ACT inhaler Commonly known as: VENTOLIN HFA Inhale 2 puffs into the lungs every 6 (six) hours as needed for wheezing or shortness of breath. Out of medication   amLODipine 10 MG tablet Commonly known as: NORVASC Take 10 mg by mouth daily.   ASPIRIN 81 PO Take 1 tablet by mouth daily.   calcium acetate 667 MG capsule Commonly known as: PHOSLO Take 2 capsules by mouth in the morning, at noon, and at bedtime.   carvedilol 12.5 MG tablet Commonly known as: COREG Take 12.5 mg by mouth 2 (two) times daily with a meal.   cetirizine 10 MG tablet Commonly known as: ZYRTEC Take 10 mg by mouth daily as needed for allergies.   clopidogrel 75 MG tablet Commonly known as: PLAVIX Take 1 tablet (75 mg total) by mouth daily.   [facility] Melatonin 5 MG Chew Generic drug: Melatonin Chew 1 tablet by mouth at bedtime.   epoetin alfa 10000 UNIT/ML injection Commonly known as: EPOGEN Inject 0.4 mLs (4,000 Units total) into the vein Every Tuesday,Thursday,and Saturday with dialysis. Start taking on: Apr 03, 2020   furosemide 80 MG tablet Commonly known as: Lasix Take 1 tablet (80 mg total) by mouth daily.   hydrALAZINE 50 MG tablet Commonly known as: APRESOLINE TAKE  1 TABLET BY MOUTH EVERY 8 HOURS   HYDROcodone-acetaminophen 7.5-325 MG tablet Commonly known as: NORCO Take 1 tablet by mouth every 6 (six) hours as needed (pain).   ipratropium-albuterol 0.5-2.5 (3) MG/3ML Soln Commonly known as: DUONEB Take 3 mLs by nebulization every 6 (six) hours as needed.   iron polysaccharides 150 MG capsule Commonly known as: NIFEREX Take 150 mg by mouth daily.   lidocaine 5 % Commonly known as: LIDODERM Place 1 patch onto the skin daily. Remove & Discard patch within 12 hours or as directed by MD   lidocaine-prilocaine cream Commonly known as: EMLA Apply 1 application topically as needed (prior to treatment).   losartan 50 MG tablet Commonly known as: COZAAR Take 50 mg by mouth daily.   mirtazapine 15 MG tablet Commonly known as: REMERON Take 15 mg by mouth at bedtime.   oxybutynin 15 MG 24 hr tablet Commonly known as: DITROPAN XL Take 15 mg by mouth daily.   pravastatin 40 MG tablet Commonly known as: PRAVACHOL Take 40 mg by mouth at bedtime.   sertraline 50 MG tablet Commonly known as: ZOLOFT Take 50 mg by mouth at bedtime.   topiramate 25 MG tablet Commonly known  as: TOPAMAX Take 1 tablet (25 mg total) by mouth as needed (for headaches).      Follow-up Information    [name], MD Follow up in 1 week(s).   Specialty: Internal Medicine Contact information: [place] [id number] [phone]          Allergies  Allergen Reactions  . Enalapril Maleate Other (See Comments)    Other reaction(s): Headache  . Nitrofurantoin Swelling and Rash    Other Reaction: swelling of body  . Sulfamethoxazole-Trimethoprim Swelling  . 2,4-D Dimethylamine (Amisol) Rash and Other (See Comments)    Other Reaction: h/a  . Baclofen Other (See Comments) and Nausea Only    lightheadness ,drowsiness , muscle weakness , twitching in hands   . Neosporin [Neomycin-Bacitracin Zn-Polymyx] Other (See  Comments) and Rash    Other Reaction: irritation Skin irritation   . Quinine Nausea And Vomiting, Rash and Other (See Comments)    Other Reaction: Vomiting, rash, h/a, vision  . Ultram [Tramadol] Palpitations  . Zocor [Simvastatin] Other (See Comments) and Rash    Other Reaction: muscle spasms Muscle pain and spasms  . Bactrim [Sulfamethoxazole-Trimethoprim] Swelling  . Levodopa Other (See Comments)    Reaction: unknown  . Macrodantin [Nitrofurantoin Macrocrystal] Swelling  . Quinine Derivatives Other (See Comments)    Vertigo,nausea vomiting blurred vision headache ears sensitivity     Consultations:   nephrology  Procedures/Studies: DG Chest 1 View  Result Date: 04/01/2020 CLINICAL DATA:  Shortness of breath EXAM: CHEST  1 VIEW COMPARISON:  Mar 29, 2020 FINDINGS: The heart size and mediastinal contours are unchanged with mild cardiomegaly. Aortic knob calcifications. Again noted are multifocal airspace opacities and increased interstitial markings throughout both lungs as on the prior exam. There is slight interval worsening in the right upper lung airspace opacities. Pleural calcifications seen in the right upper lung. IMPRESSION: Multifocal airspace opacities throughout both lungs with slight interval worsening in the right upper lung which could be due to multifocal pneumonia or asymmetric edema with superimposed chronic lung changes. Electronically Signed   By: Prudencio Pair M.D.   On: 04/01/2020 00:20   DG Chest Portable 1 View  Result Date: 03/29/2020 CLINICAL DATA:  Shortness of breath EXAM: PORTABLE CHEST 1 VIEW COMPARISON:  12/04/2019 FINDINGS: Cardiac shadow is mildly enlarged. Aortic calcifications are seen. Bilateral airspace opacities are noted increased from the prior exam likely representing some acute on chronic multifocal pneumonia. Possible mild pulmonary edema could not be totally excluded. No sizable effusion is seen. IMPRESSION: Increased parenchymal opacities  bilaterally. This may represent acute on chronic infiltrate or possibly some acute pulmonary edema superimposed over more chronic pattern. Electronically Signed   By: Inez Catalina M.D.   On: 03/29/2020 22:37      Subjective: Feels much better. No sob. No cp  Discharge Exam: Vitals:   04/02/20 1100 04/02/20 1130  BP: (!) 149/53   Pulse: 85   Resp: 14   Temp:  99 F (37.2 C)  SpO2: 98%    Vitals:   04/02/20 0850 04/02/20 1030 04/02/20 1100 04/02/20 1130  BP:  (!) 147/40 (!) 149/53   Pulse:  80 85   Resp:   14   Temp:    99 F (37.2 C)  TempSrc:    Oral  SpO2: 96%  98%   Weight:      Height:        General: Pt is alert, awake, not in acute distress Cardiovascular: RRR,  S1/S2 +, no rubs, no gallops Respiratory: CTA bilaterally, no wheezing, no rhonchi Abdominal: Soft, NT, ND, bowel sounds + Extremities: no edema, no cyanosis    The results of significant diagnostics from this hospitalization (including imaging, microbiology, ancillary and laboratory) are listed below for reference.     Microbiology: Recent Results (from the past 240 hour(s))  SARS Coronavirus 2 by RT PCR (hospital order, performed in [facility] hospital lab) Nasopharyngeal Nasopharyngeal Swab     Status: None   Collection Time: 03/29/20 10:15 PM   Specimen: Nasopharyngeal Swab  Result Value Ref Range Status   SARS Coronavirus 2 NEGATIVE NEGATIVE Final    Comment: (NOTE) SARS-CoV-2 target nucleic acids are NOT DETECTED. The SARS-CoV-2 RNA is generally detectable in upper and lower respiratory specimens during the acute phase of infection. The lowest concentration of SARS-CoV-2 viral copies this assay can detect is 250 copies / mL. A negative result does not preclude SARS-CoV-2 infection and should not be used as the sole basis for treatment or other patient management decisions.  A negative result may occur with improper specimen collection / handling, submission of specimen other than  nasopharyngeal swab, presence of viral mutation(s) within the areas targeted by this assay, and inadequate number of viral copies (<250 copies / mL). A negative result must be combined with clinical observations, patient history, and epidemiological information. Fact Sheet for Patients:   StrictlyIdeas.no Fact Sheet for Healthcare Providers: BankingDealers.co.za This test is not yet approved or cleared  by the [place] FDA and has been authorized for detection and/or diagnosis of SARS-CoV-2 by FDA under an Emergency Use Authorization (EUA).  This EUA will remain in effect (meaning this test can be used) for the duration of the COVID-19 declaration under Section 564(b)(1) of the Act, 21 U.S.C. section 360bbb-3(b)(1), unless the authorization is terminated or revoked sooner. Performed at [facility], [address] [place], [place] [address]      Labs: BNP (last 3 results) Recent Labs    03/29/20 2214 04/01/20 0517 04/02/20 0925  BNP 1,226.0* 1,260.0* 3,149.7*   Basic Metabolic Panel: Recent Labs  Lab 03/29/20 2214 03/30/20 1804 03/31/20 0527 04/01/20 0517 04/02/20 0616  NA 140  --  141 140 139  K 5.0  --  4.3 4.8 4.6  CL 100  --  97* 98 100  CO2 24  --  33* 29 29  GLUCOSE 186*  --  83 115* 84  BUN 74*  --  41* 56* 27*  CREATININE 7.45*  --  4.80* 6.45* 4.36*  CALCIUM 8.0*  --  8.2* 8.9 8.8*  PHOS  --  3.0  --   --   --    Liver Function Tests: No results for input(s): AST, ALT, ALKPHOS, BILITOT, PROT, ALBUMIN in the last 168 hours. No results for input(s): LIPASE, AMYLASE in the last 168 hours. No results for input(s): AMMONIA in the last 168 hours. CBC: Recent Labs  Lab 03/29/20 2214 03/30/20 1110 03/31/20 0527 04/01/20 0517  WBC 8.7 5.0 4.2 7.5  NEUTROABS 6.7 3.8  --   --   HGB 9.8* 8.4* 7.8* 8.4*  HCT 29.7* 25.3* 23.7* 25.0*  MCV 88.9 87.8 89.1 87.7  PLT 177 133* 117* 131*   Cardiac  Enzymes: No results for input(s): CKTOTAL, CKMB, CKMBINDEX, TROPONINI in the last 168 hours. BNP: Invalid input(s): POCBNP CBG: Recent Labs  Lab 04/01/20 1129 04/01/20 1555 04/01/20 2122 04/02/20 0719 04/02/20 1114  GLUCAP 94 85 155* 81 129*  D-Dimer No results for input(s): DDIMER in the last 72 hours. Hgb A1c No results for input(s): HGBA1C in the last 72 hours. Lipid Profile No results for input(s): CHOL, HDL, LDLCALC, TRIG, CHOLHDL, LDLDIRECT in the last 72 hours. Thyroid function studies No results for input(s): TSH, T4TOTAL, T3FREE, THYROIDAB in the last 72 hours.  Invalid input(s): FREET3 Anemia work up No results for input(s): VITAMINB12, FOLATE, FERRITIN, TIBC, IRON, RETICCTPCT in the last 72 hours. Urinalysis    Component Value Date/Time   COLORURINE YELLOW (A) 12/04/2019 2054   APPEARANCEUR CLOUDY (A) 12/04/2019 2054   LABSPEC 1.008 12/04/2019 2054   PHURINE 8.0 12/04/2019 2054   GLUCOSEU NEGATIVE 12/04/2019 2054   HGBUR MODERATE (A) 12/04/2019 2054   BILIRUBINUR NEGATIVE 12/04/2019 2054   KETONESUR NEGATIVE 12/04/2019 2054   PROTEINUR 100 (A) 12/04/2019 2054   NITRITE NEGATIVE 12/04/2019 2054   LEUKOCYTESUR LARGE (A) 12/04/2019 2054   Sepsis Labs Invalid input(s): PROCALCITONIN,  WBC,  LACTICIDVEN Microbiology Recent Results (from the past 240 hour(s))  SARS Coronavirus 2 by RT PCR (hospital order, performed in [place] hospital lab) Nasopharyngeal Nasopharyngeal Swab     Status: None   Collection Time: 03/29/20 10:15 PM   Specimen: Nasopharyngeal Swab  Result Value Ref Range Status   SARS Coronavirus 2 NEGATIVE NEGATIVE Final    Comment: (NOTE) SARS-CoV-2 target nucleic acids are NOT DETECTED. The SARS-CoV-2 RNA is generally detectable in upper and lower respiratory specimens during the acute phase of infection. The lowest concentration of SARS-CoV-2 viral copies this assay can detect is 250 copies / mL. A negative result does not preclude  SARS-CoV-2 infection and should not be used as the sole basis for treatment or other patient management decisions.  A negative result may occur with improper specimen collection / handling, submission of specimen other than nasopharyngeal swab, presence of viral mutation(s) within the areas targeted by this assay, and inadequate number of viral copies (<250 copies / mL). A negative result must be combined with clinical observations, patient history, and epidemiological information. Fact Sheet for Patients:   StrictlyIdeas.no Fact Sheet for Healthcare Providers: BankingDealers.co.za This test is not yet approved or cleared  by the [place] FDA and has been authorized for detection and/or diagnosis of SARS-CoV-2 by FDA under an Emergency Use Authorization (EUA).  This EUA will remain in effect (meaning this test can be used) for the duration of the COVID-19 declaration under Section 564(b)(1) of the Act, 21 U.S.C. section 360bbb-3(b)(1), unless the authorization is terminated or revoked sooner. Performed at [facility], [address], [place], [place] [address]      Time coordinating discharge: Over 30 minutes  SIGNED:   Nolberto Hanlon, MD  [facility] 04/02/2020, 12:22 PM Pager   If 7PM-7AM, please contact night-coverage [email or web address] Password [email or web address]

## 2020-04-02 NOTE — TOC Transition Note (Signed)
Transition of Care [facility]) - CM/SW Discharge Note   Patient Details  Name: Phyllis [name] MRN: [id number] Date of Birth: [birth year]  Transition of Care [facility]) CM/SW Contact:  Magnus Ivan, LCSW Phone Number: 04/02/2020, 12:10 PM   Clinical Narrative:   Patient has orders to dischage home today. Spoke with patient who confirmed she has 24 hour aide services and PT through [facility]. Patient agreeable to adding RN through [facility]. CSW updated [place] and Dr. Kurtis Bushman. Asked Dr. Kurtis Bushman to put in [facility] orders. Patient reported she has a ride home and denied any other needs at home.     Final next level of care: [place] Barriers to Discharge: Barriers Resolved   Patient Goals and CMS Choice Patient states their goals for this hospitalization and ongoing recovery are:: Get back home and feel better      Discharge Placement                Patient to be transferred to facility by: says she has a ride Name of family member notified: patient Patient and family notified of of transfer: 04/02/20  Discharge Plan and Services                          HH Arranged: PT, RN [facility] Agency: [place] ([place]) Date [place]: 04/02/20   Representative spoke with at [place]: [place] ([place]) Interventions     Readmission Risk Interventions Readmission Risk Prevention Plan 03/31/2020 10/13/2019 09/26/2019  Transportation Screening Complete - Complete  Medication Review Press photographer) Complete - Complete  PCP or Specialist appointment within 3-5 days of discharge - Complete Complete  HRI or [place] - (No Data) Complete  SW Recovery Care/Counseling Consult - Complete Complete  Palliative Care Screening - Not Applicable Not [place] - Complete Not Applicable  Some recent data might be hidden

## 2020-04-19 ENCOUNTER — Emergency Department: Payer: Medicare Other

## 2020-04-19 ENCOUNTER — Inpatient Hospital Stay
Admission: EM | Admit: 2020-04-19 | Discharge: 2020-04-22 | DRG: 189 | Disposition: A | Discharge status: Home Health | Payer: Medicare Other | Attending: Internal Medicine | Admitting: Internal Medicine

## 2020-04-19 DIAGNOSIS — R0600 Dyspnea, unspecified: Secondary | ICD-10-CM | POA: Diagnosis present

## 2020-04-19 DIAGNOSIS — N2581 Secondary hyperparathyroidism of renal origin: Secondary | ICD-10-CM | POA: Diagnosis present

## 2020-04-19 DIAGNOSIS — N3281 Overactive bladder: Secondary | ICD-10-CM | POA: Diagnosis present

## 2020-04-19 DIAGNOSIS — Z885 Allergy status to narcotic agent status: Secondary | ICD-10-CM

## 2020-04-19 DIAGNOSIS — I350 Nonrheumatic aortic (valve) stenosis: Secondary | ICD-10-CM | POA: Diagnosis present

## 2020-04-19 DIAGNOSIS — I2510 Atherosclerotic heart disease of native coronary artery without angina pectoris: Secondary | ICD-10-CM | POA: Diagnosis present

## 2020-04-19 DIAGNOSIS — Z7982 Long term (current) use of aspirin: Secondary | ICD-10-CM

## 2020-04-19 DIAGNOSIS — Z881 Allergy status to other antibiotic agents status: Secondary | ICD-10-CM

## 2020-04-19 DIAGNOSIS — D631 Anemia in chronic kidney disease: Secondary | ICD-10-CM | POA: Diagnosis present

## 2020-04-19 DIAGNOSIS — Z79899 Other long term (current) drug therapy: Secondary | ICD-10-CM

## 2020-04-19 DIAGNOSIS — J810 Acute pulmonary edema: Secondary | ICD-10-CM | POA: Diagnosis present

## 2020-04-19 DIAGNOSIS — M940 Chondrocostal junction syndrome [Tietze]: Secondary | ICD-10-CM | POA: Diagnosis present

## 2020-04-19 DIAGNOSIS — Z8616 Personal history of COVID-19: Secondary | ICD-10-CM

## 2020-04-19 DIAGNOSIS — Z20822 Contact with and (suspected) exposure to covid-19: Secondary | ICD-10-CM | POA: Diagnosis present

## 2020-04-19 DIAGNOSIS — Z882 Allergy status to sulfonamides status: Secondary | ICD-10-CM

## 2020-04-19 DIAGNOSIS — Z7902 Long term (current) use of antithrombotics/antiplatelets: Secondary | ICD-10-CM

## 2020-04-19 DIAGNOSIS — J9601 Acute respiratory failure with hypoxia: Secondary | ICD-10-CM | POA: Diagnosis present

## 2020-04-19 DIAGNOSIS — M109 Gout, unspecified: Secondary | ICD-10-CM | POA: Diagnosis present

## 2020-04-19 DIAGNOSIS — D696 Thrombocytopenia, unspecified: Secondary | ICD-10-CM | POA: Diagnosis present

## 2020-04-19 DIAGNOSIS — Z8249 Family history of ischemic heart disease and other diseases of the circulatory system: Secondary | ICD-10-CM

## 2020-04-19 DIAGNOSIS — K219 Gastro-esophageal reflux disease without esophagitis: Secondary | ICD-10-CM | POA: Diagnosis present

## 2020-04-19 DIAGNOSIS — G4730 Sleep apnea, unspecified: Secondary | ICD-10-CM | POA: Diagnosis present

## 2020-04-19 DIAGNOSIS — Z09 Encounter for follow-up examination after completed treatment for conditions other than malignant neoplasm: Secondary | ICD-10-CM

## 2020-04-19 DIAGNOSIS — I5033 Acute on chronic diastolic (congestive) heart failure: Secondary | ICD-10-CM | POA: Diagnosis present

## 2020-04-19 DIAGNOSIS — J449 Chronic obstructive pulmonary disease, unspecified: Secondary | ICD-10-CM | POA: Diagnosis present

## 2020-04-19 DIAGNOSIS — Z79891 Long term (current) use of opiate analgesic: Secondary | ICD-10-CM

## 2020-04-19 DIAGNOSIS — E1122 Type 2 diabetes mellitus with diabetic chronic kidney disease: Secondary | ICD-10-CM | POA: Diagnosis present

## 2020-04-19 DIAGNOSIS — E785 Hyperlipidemia, unspecified: Secondary | ICD-10-CM | POA: Diagnosis present

## 2020-04-19 DIAGNOSIS — N186 End stage renal disease: Secondary | ICD-10-CM | POA: Diagnosis present

## 2020-04-19 DIAGNOSIS — E1169 Type 2 diabetes mellitus with other specified complication: Secondary | ICD-10-CM | POA: Diagnosis present

## 2020-04-19 DIAGNOSIS — R0602 Shortness of breath: Secondary | ICD-10-CM | POA: Diagnosis not present

## 2020-04-19 DIAGNOSIS — Z992 Dependence on renal dialysis: Secondary | ICD-10-CM

## 2020-04-19 DIAGNOSIS — Z83438 Family history of other disorder of lipoprotein metabolism and other lipidemia: Secondary | ICD-10-CM

## 2020-04-19 DIAGNOSIS — Z888 Allergy status to other drugs, medicaments and biological substances status: Secondary | ICD-10-CM

## 2020-04-19 DIAGNOSIS — E875 Hyperkalemia: Secondary | ICD-10-CM

## 2020-04-19 LAB — BLOOD GAS, VENOUS
Acid-base deficit: 2.2 mmol/L — ABNORMAL HIGH (ref 0.0–2.0)
Bicarbonate: 23.7 mmol/L (ref 20.0–28.0)
Delivery systems: POSITIVE
FIO2: 0.35
O2 Saturation: 94.8 %
PEEP: 6 cmH2O
Patient temperature: 37
Pressure support: 12 cmH2O
pCO2, Ven: 45 mmHg (ref 44.0–60.0)
pH, Ven: 7.33 (ref 7.250–7.430)
pO2, Ven: 80 mmHg — ABNORMAL HIGH (ref 32.0–45.0)

## 2020-04-19 MED ORDER — IPRATROPIUM-ALBUTEROL 0.5-2.5 (3) MG/3ML IN SOLN
3.0000 mL | Freq: Once | RESPIRATORY_TRACT | Status: AC
  Administered 2020-04-19: 3 mL via RESPIRATORY_TRACT
  Filled 2020-04-19: qty 3

## 2020-04-19 MED ORDER — NITROGLYCERIN IN D5W 200-5 MCG/ML-% IV SOLN
INTRAVENOUS | Status: AC
  Filled 2020-04-19: qty 250

## 2020-04-19 MED ORDER — METHYLPREDNISOLONE SODIUM SUCC 125 MG IJ SOLR
125.0000 mg | Freq: Once | INTRAMUSCULAR | Status: AC
  Administered 2020-04-19: 125 mg via INTRAVENOUS
  Filled 2020-04-19: qty 2

## 2020-04-19 MED ORDER — FUROSEMIDE 10 MG/ML IJ SOLN
80.0000 mg | Freq: Once | INTRAMUSCULAR | Status: AC
  Administered 2020-04-19: 80 mg via INTRAVENOUS
  Filled 2020-04-19: qty 8

## 2020-04-19 NOTE — ED Triage Notes (Signed)
Pt from home to ED via [facility] for SOB> Pt lives alone and her aide assists her. 911 was called for [facility], per EMS RA stats were 42%. Pt was placed on a CPAP that brought O2 stats to 85-87%. HR: 120 w/ sinus on the monitor. Lung sounds wet/crackles per EMS. Pt has a pmhx of COPD/ CHF/ DM/ renal disorder- dialysis (last dialized on Friday)   Upon arrival pt alert with CPAP stat of 93. EDP, RT and additional staff at bedside

## 2020-04-20 ENCOUNTER — Encounter: Payer: Self-pay | Admitting: Emergency Medicine

## 2020-04-20 ENCOUNTER — Other Ambulatory Visit: Payer: Self-pay

## 2020-04-20 ENCOUNTER — Inpatient Hospital Stay (HOSPITAL_COMMUNITY)
Admit: 2020-04-20 | Discharge: 2020-04-20 | Disposition: A | Discharge status: Home / Self Care | Payer: Medicare Other | Attending: Internal Medicine | Admitting: Internal Medicine

## 2020-04-20 DIAGNOSIS — I5031 Acute diastolic (congestive) heart failure: Secondary | ICD-10-CM

## 2020-04-20 DIAGNOSIS — K219 Gastro-esophageal reflux disease without esophagitis: Secondary | ICD-10-CM | POA: Diagnosis present

## 2020-04-20 DIAGNOSIS — I5033 Acute on chronic diastolic (congestive) heart failure: Secondary | ICD-10-CM | POA: Diagnosis present

## 2020-04-20 DIAGNOSIS — E1169 Type 2 diabetes mellitus with other specified complication: Secondary | ICD-10-CM | POA: Diagnosis present

## 2020-04-20 DIAGNOSIS — I350 Nonrheumatic aortic (valve) stenosis: Secondary | ICD-10-CM | POA: Diagnosis present

## 2020-04-20 DIAGNOSIS — J449 Chronic obstructive pulmonary disease, unspecified: Secondary | ICD-10-CM | POA: Diagnosis present

## 2020-04-20 DIAGNOSIS — R0602 Shortness of breath: Secondary | ICD-10-CM | POA: Diagnosis present

## 2020-04-20 DIAGNOSIS — I2510 Atherosclerotic heart disease of native coronary artery without angina pectoris: Secondary | ICD-10-CM | POA: Diagnosis present

## 2020-04-20 DIAGNOSIS — N186 End stage renal disease: Secondary | ICD-10-CM | POA: Diagnosis present

## 2020-04-20 DIAGNOSIS — Z885 Allergy status to narcotic agent status: Secondary | ICD-10-CM | POA: Diagnosis not present

## 2020-04-20 DIAGNOSIS — Z7902 Long term (current) use of antithrombotics/antiplatelets: Secondary | ICD-10-CM | POA: Diagnosis not present

## 2020-04-20 DIAGNOSIS — E785 Hyperlipidemia, unspecified: Secondary | ICD-10-CM | POA: Diagnosis present

## 2020-04-20 DIAGNOSIS — G4730 Sleep apnea, unspecified: Secondary | ICD-10-CM | POA: Diagnosis present

## 2020-04-20 DIAGNOSIS — Z881 Allergy status to other antibiotic agents status: Secondary | ICD-10-CM | POA: Diagnosis not present

## 2020-04-20 DIAGNOSIS — J9601 Acute respiratory failure with hypoxia: Secondary | ICD-10-CM | POA: Diagnosis present

## 2020-04-20 DIAGNOSIS — J810 Acute pulmonary edema: Secondary | ICD-10-CM | POA: Diagnosis not present

## 2020-04-20 DIAGNOSIS — Z992 Dependence on renal dialysis: Secondary | ICD-10-CM | POA: Diagnosis not present

## 2020-04-20 DIAGNOSIS — Z888 Allergy status to other drugs, medicaments and biological substances status: Secondary | ICD-10-CM | POA: Diagnosis not present

## 2020-04-20 DIAGNOSIS — R0600 Dyspnea, unspecified: Secondary | ICD-10-CM | POA: Diagnosis present

## 2020-04-20 DIAGNOSIS — Z09 Encounter for follow-up examination after completed treatment for conditions other than malignant neoplasm: Secondary | ICD-10-CM | POA: Diagnosis not present

## 2020-04-20 DIAGNOSIS — D696 Thrombocytopenia, unspecified: Secondary | ICD-10-CM | POA: Diagnosis present

## 2020-04-20 DIAGNOSIS — Z8616 Personal history of COVID-19: Secondary | ICD-10-CM | POA: Diagnosis not present

## 2020-04-20 DIAGNOSIS — D631 Anemia in chronic kidney disease: Secondary | ICD-10-CM | POA: Diagnosis present

## 2020-04-20 DIAGNOSIS — E1122 Type 2 diabetes mellitus with diabetic chronic kidney disease: Secondary | ICD-10-CM | POA: Diagnosis present

## 2020-04-20 DIAGNOSIS — Z882 Allergy status to sulfonamides status: Secondary | ICD-10-CM | POA: Diagnosis not present

## 2020-04-20 DIAGNOSIS — E875 Hyperkalemia: Secondary | ICD-10-CM | POA: Diagnosis present

## 2020-04-20 DIAGNOSIS — Z20822 Contact with and (suspected) exposure to covid-19: Secondary | ICD-10-CM | POA: Diagnosis present

## 2020-04-20 DIAGNOSIS — N2581 Secondary hyperparathyroidism of renal origin: Secondary | ICD-10-CM | POA: Diagnosis present

## 2020-04-20 LAB — CBC WITH DIFFERENTIAL/PLATELET
Abs Immature Granulocytes: 0.04 10*3/uL (ref 0.00–0.07)
Basophils Absolute: 0 10*3/uL (ref 0.0–0.1)
Basophils Relative: 1 %
Eosinophils Absolute: 0.5 10*3/uL (ref 0.0–0.5)
Eosinophils Relative: 7 %
HCT: 27.5 % — ABNORMAL LOW (ref 36.0–46.0)
Hemoglobin: 8.8 g/dL — ABNORMAL LOW (ref 12.0–15.0)
Immature Granulocytes: 1 %
Lymphocytes Relative: 17 %
Lymphs Abs: 1.4 10*3/uL (ref 0.7–4.0)
MCH: 29.6 pg (ref 26.0–34.0)
MCHC: 32 g/dL (ref 30.0–36.0)
MCV: 92.6 fL (ref 80.0–100.0)
Monocytes Absolute: 0.5 10*3/uL (ref 0.1–1.0)
Monocytes Relative: 7 %
Neutro Abs: 5.5 10*3/uL (ref 1.7–7.7)
Neutrophils Relative %: 67 %
Platelets: 234 10*3/uL (ref 150–400)
RBC: 2.97 MIL/uL — ABNORMAL LOW (ref 3.87–5.11)
RDW: 14.1 % (ref 11.5–15.5)
WBC: 8 10*3/uL (ref 4.0–10.5)
nRBC: 0 % (ref 0.0–0.2)

## 2020-04-20 LAB — ECHOCARDIOGRAM COMPLETE
Height: 58 in
Weight: 2003.54 oz

## 2020-04-20 LAB — COMPREHENSIVE METABOLIC PANEL
ALT: 9 U/L (ref 0–44)
AST: 18 U/L (ref 15–41)
Albumin: 4 g/dL (ref 3.5–5.0)
Alkaline Phosphatase: 115 U/L (ref 38–126)
Anion gap: 13 (ref 5–15)
BUN: 60 mg/dL — ABNORMAL HIGH (ref 8–23)
CO2: 23 mmol/L (ref 22–32)
Calcium: 8.6 mg/dL — ABNORMAL LOW (ref 8.9–10.3)
Chloride: 98 mmol/L (ref 98–111)
Creatinine, Ser: 6.88 mg/dL — ABNORMAL HIGH (ref 0.44–1.00)
GFR calc Af Amer: 6 mL/min — ABNORMAL LOW (ref >60)
GFR calc non Af Amer: 5 mL/min — ABNORMAL LOW (ref >60)
Glucose, Bld: 189 mg/dL — ABNORMAL HIGH (ref 70–99)
Potassium: 6 mmol/L — ABNORMAL HIGH (ref 3.5–5.1)
Sodium: 134 mmol/L — ABNORMAL LOW (ref 135–145)
Total Bilirubin: 0.7 mg/dL (ref 0.3–1.2)
Total Protein: 6.7 g/dL (ref 6.5–8.1)

## 2020-04-20 LAB — PROCALCITONIN: Procalcitonin: 0.17 ng/mL

## 2020-04-20 LAB — GLUCOSE, CAPILLARY
Glucose-Capillary: 146 mg/dL — ABNORMAL HIGH (ref 70–99)
Glucose-Capillary: 136 mg/dL — ABNORMAL HIGH (ref 70–99)
Glucose-Capillary: 193 mg/dL — ABNORMAL HIGH (ref 70–99)

## 2020-04-20 LAB — SARS CORONAVIRUS 2 BY RT PCR (HOSPITAL ORDER, PERFORMED IN ~~LOC~~ HOSPITAL LAB): SARS Coronavirus 2: NEGATIVE

## 2020-04-20 LAB — BASIC METABOLIC PANEL
Anion gap: 13 (ref 5–15)
BUN: 21 mg/dL (ref 8–23)
CO2: 29 mmol/L (ref 22–32)
Calcium: 8.7 mg/dL — ABNORMAL LOW (ref 8.9–10.3)
Chloride: 95 mmol/L — ABNORMAL LOW (ref 98–111)
Creatinine, Ser: 3.26 mg/dL — ABNORMAL HIGH (ref 0.44–1.00)
GFR calc Af Amer: 16 mL/min — ABNORMAL LOW (ref >60)
GFR calc non Af Amer: 13 mL/min — ABNORMAL LOW (ref >60)
Glucose, Bld: 148 mg/dL — ABNORMAL HIGH (ref 70–99)
Potassium: 4.7 mmol/L (ref 3.5–5.1)
Sodium: 137 mmol/L (ref 135–145)

## 2020-04-20 LAB — BRAIN NATRIURETIC PEPTIDE: B Natriuretic Peptide: 960.8 pg/mL — ABNORMAL HIGH (ref 0.0–100.0)

## 2020-04-20 LAB — TROPONIN I (HIGH SENSITIVITY): Troponin I (High Sensitivity): 19 ng/L — ABNORMAL HIGH (ref <18)

## 2020-04-20 LAB — LACTIC ACID, PLASMA: Lactic Acid, Venous: 1.6 mmol/L (ref 0.5–1.9)

## 2020-04-20 MED ORDER — ALTEPLASE 2 MG IJ SOLR
2.0000 mg | Freq: Once | INTRAMUSCULAR | Status: DC | PRN
  Filled 2020-04-20: qty 2

## 2020-04-20 MED ORDER — HYDROCODONE-ACETAMINOPHEN 7.5-325 MG PO TABS
1.0000 | ORAL_TABLET | Freq: Every day | ORAL | Status: DC | PRN
  Administered 2020-04-20 – 2020-04-22 (×3): 1 via ORAL
  Filled 2020-04-20: qty 1
  Filled 2020-04-20 (×2): qty 2

## 2020-04-20 MED ORDER — FLUTICASONE PROPIONATE 50 MCG/ACT NA SUSP
1.0000 | Freq: Every day | NASAL | Status: DC | PRN
  Filled 2020-04-20: qty 16

## 2020-04-20 MED ORDER — HYDRALAZINE HCL 50 MG PO TABS
50.0000 mg | ORAL_TABLET | Freq: Three times a day (TID) | ORAL | Status: DC
  Administered 2020-04-20 – 2020-04-21 (×5): 50 mg via ORAL
  Filled 2020-04-20 (×7): qty 1

## 2020-04-20 MED ORDER — POLYSACCHARIDE IRON COMPLEX 150 MG PO CAPS
150.0000 mg | ORAL_CAPSULE | Freq: Every day | ORAL | Status: DC
  Administered 2020-04-20 – 2020-04-21 (×2): 150 mg via ORAL
  Filled 2020-04-20 (×3): qty 1

## 2020-04-20 MED ORDER — INSULIN ASPART 100 UNIT/ML ~~LOC~~ SOLN
0.0000 [IU] | Freq: Three times a day (TID) | SUBCUTANEOUS | Status: DC
  Administered 2020-04-20: 2 [IU] via SUBCUTANEOUS
  Administered 2020-04-20: 3 [IU] via SUBCUTANEOUS
  Administered 2020-04-20: 2 [IU] via SUBCUTANEOUS
  Administered 2020-04-21: 3 [IU] via SUBCUTANEOUS
  Filled 2020-04-20 (×4): qty 1

## 2020-04-20 MED ORDER — TOPIRAMATE 25 MG PO TABS
25.0000 mg | ORAL_TABLET | ORAL | Status: DC | PRN
  Filled 2020-04-20: qty 1

## 2020-04-20 MED ORDER — ACETAMINOPHEN 325 MG PO TABS
650.0000 mg | ORAL_TABLET | ORAL | Status: DC | PRN
  Administered 2020-04-20: 650 mg via ORAL
  Filled 2020-04-20 (×2): qty 2

## 2020-04-20 MED ORDER — ONDANSETRON HCL 4 MG/2ML IJ SOLN
4.0000 mg | Freq: Four times a day (QID) | INTRAMUSCULAR | Status: DC | PRN
  Administered 2020-04-20: 4 mg via INTRAVENOUS
  Filled 2020-04-20: qty 2

## 2020-04-20 MED ORDER — SODIUM CHLORIDE 0.9 % IV SOLN: 250.0000 mL | INTRAVENOUS | Status: DC | PRN

## 2020-04-20 MED ORDER — SERTRALINE HCL 50 MG PO TABS
50.0000 mg | ORAL_TABLET | Freq: Every day | ORAL | Status: DC
  Administered 2020-04-20: 50 mg via ORAL
  Filled 2020-04-20 (×2): qty 1

## 2020-04-20 MED ORDER — MIRTAZAPINE 15 MG PO TABS
15.0000 mg | ORAL_TABLET | Freq: Every day | ORAL | Status: DC
  Administered 2020-04-20 – 2020-04-21 (×2): 15 mg via ORAL
  Filled 2020-04-20 (×2): qty 1

## 2020-04-20 MED ORDER — IPRATROPIUM-ALBUTEROL 0.5-2.5 (3) MG/3ML IN SOLN
3.0000 mL | Freq: Four times a day (QID) | RESPIRATORY_TRACT | Status: DC | PRN
  Administered 2020-04-20: 3 mL via RESPIRATORY_TRACT
  Filled 2020-04-20: qty 3

## 2020-04-20 MED ORDER — LIDOCAINE-PRILOCAINE 2.5-2.5 % EX CREA: 1.0000 "application " | TOPICAL_CREAM | CUTANEOUS | Status: DC | PRN

## 2020-04-20 MED ORDER — FUROSEMIDE 10 MG/ML IJ SOLN
60.0000 mg | Freq: Two times a day (BID) | INTRAMUSCULAR | Status: DC
  Administered 2020-04-20 – 2020-04-21 (×4): 60 mg via INTRAVENOUS
  Filled 2020-04-20: qty 6
  Filled 2020-04-20: qty 8
  Filled 2020-04-20: qty 6
  Filled 2020-04-20: qty 8

## 2020-04-20 MED ORDER — SODIUM CHLORIDE 0.9% FLUSH
3.0000 mL | Freq: Two times a day (BID) | INTRAVENOUS | Status: DC
  Administered 2020-04-20 – 2020-04-21 (×2): 3 mL via INTRAVENOUS

## 2020-04-20 MED ORDER — INSULIN ASPART 100 UNIT/ML ~~LOC~~ SOLN
10.0000 [IU] | Freq: Once | SUBCUTANEOUS | Status: AC
  Administered 2020-04-20: 10 [IU] via INTRAVENOUS
  Filled 2020-04-20: qty 1

## 2020-04-20 MED ORDER — DEXTROSE 50 % IV SOLN
INTRAVENOUS | Status: AC
  Filled 2020-04-20: qty 50

## 2020-04-20 MED ORDER — SODIUM BICARBONATE 8.4 % IV SOLN
50.0000 meq | Freq: Once | INTRAVENOUS | Status: AC
  Administered 2020-04-20: 50 meq via INTRAVENOUS
  Filled 2020-04-20: qty 50

## 2020-04-20 MED ORDER — FUROSEMIDE 10 MG/ML IJ SOLN: 60.0000 mg | Freq: Two times a day (BID) | INTRAMUSCULAR | Status: DC

## 2020-04-20 MED ORDER — ALBUTEROL SULFATE (2.5 MG/3ML) 0.083% IN NEBU
2.5000 mg | INHALATION_SOLUTION | Freq: Four times a day (QID) | RESPIRATORY_TRACT | Status: DC | PRN
  Administered 2020-04-20: 2.5 mg via RESPIRATORY_TRACT
  Filled 2020-04-20: qty 3

## 2020-04-20 MED ORDER — AMLODIPINE BESYLATE 10 MG PO TABS
10.0000 mg | ORAL_TABLET | Freq: Every day | ORAL | Status: DC
  Administered 2020-04-20: 10 mg via ORAL
  Filled 2020-04-20: qty 2

## 2020-04-20 MED ORDER — FUROSEMIDE 10 MG/ML IJ SOLN
60.0000 mg | Freq: Two times a day (BID) | INTRAMUSCULAR | Status: DC
  Filled 2020-04-20 (×2): qty 8

## 2020-04-20 MED ORDER — LIDOCAINE 5 % EX PTCH
1.0000 | MEDICATED_PATCH | CUTANEOUS | Status: DC
  Administered 2020-04-20 – 2020-04-21 (×2): 1 via TRANSDERMAL
  Filled 2020-04-20 (×3): qty 1

## 2020-04-20 MED ORDER — MELATONIN 5 MG PO TABS
5.0000 mg | ORAL_TABLET | Freq: Every day | ORAL | Status: DC
  Administered 2020-04-21 (×2): 5 mg via ORAL
  Filled 2020-04-20 (×3): qty 1

## 2020-04-20 MED ORDER — CALCIUM ACETATE (PHOS BINDER) 667 MG PO CAPS
1334.0000 mg | ORAL_CAPSULE | Freq: Three times a day (TID) | ORAL | Status: DC
  Administered 2020-04-20 – 2020-04-21 (×3): 1334 mg via ORAL
  Filled 2020-04-20 (×7): qty 2

## 2020-04-20 MED ORDER — SODIUM CHLORIDE 0.9 % IV SOLN: 100.0000 mL | INTRAVENOUS | Status: DC | PRN

## 2020-04-20 MED ORDER — CARVEDILOL 12.5 MG PO TABS
12.5000 mg | ORAL_TABLET | Freq: Two times a day (BID) | ORAL | Status: DC
  Administered 2020-04-20 – 2020-04-21 (×3): 12.5 mg via ORAL
  Filled 2020-04-20 (×2): qty 2
  Filled 2020-04-20: qty 1

## 2020-04-20 MED ORDER — LOSARTAN POTASSIUM 50 MG PO TABS: 50.0000 mg | ORAL_TABLET | Freq: Every day | ORAL | Status: DC

## 2020-04-20 MED ORDER — HEPARIN SODIUM (PORCINE) 1000 UNIT/ML DIALYSIS
1000.0000 [IU] | INTRAMUSCULAR | Status: DC | PRN
  Filled 2020-04-20: qty 1

## 2020-04-20 MED ORDER — PRAVASTATIN SODIUM 40 MG PO TABS
40.0000 mg | ORAL_TABLET | Freq: Every day | ORAL | Status: DC
  Administered 2020-04-21 (×2): 40 mg via ORAL
  Filled 2020-04-20: qty 1
  Filled 2020-04-20: qty 2
  Filled 2020-04-20 (×2): qty 1

## 2020-04-20 MED ORDER — LIDOCAINE HCL (PF) 1 % IJ SOLN: 5.0000 mL | INTRAMUSCULAR | Status: DC | PRN

## 2020-04-20 MED ORDER — ASPIRIN EC 81 MG PO TBEC
81.0000 mg | DELAYED_RELEASE_TABLET | Freq: Every day | ORAL | Status: DC
  Administered 2020-04-20 – 2020-04-21 (×2): 81 mg via ORAL
  Filled 2020-04-20 (×3): qty 1

## 2020-04-20 MED ORDER — FENTANYL CITRATE (PF) 100 MCG/2ML IJ SOLN
50.0000 ug | Freq: Once | INTRAMUSCULAR | Status: AC
  Administered 2020-04-20: 50 ug via INTRAVENOUS
  Filled 2020-04-20: qty 2

## 2020-04-20 MED ORDER — SODIUM ZIRCONIUM CYCLOSILICATE 10 G PO PACK
10.0000 g | PACK | Freq: Once | ORAL | Status: DC
  Filled 2020-04-20: qty 1

## 2020-04-20 MED ORDER — DEXTROSE 50 % IV SOLN
1.0000 | Freq: Once | INTRAVENOUS | Status: AC
  Administered 2020-04-20: 50 mL via INTRAVENOUS
  Filled 2020-04-20: qty 50

## 2020-04-20 MED ORDER — SODIUM CHLORIDE 0.9% FLUSH: 3.0000 mL | INTRAVENOUS | Status: DC | PRN

## 2020-04-20 MED ORDER — ONDANSETRON HCL 4 MG/2ML IJ SOLN
4.0000 mg | Freq: Once | INTRAMUSCULAR | Status: AC
  Administered 2020-04-20: 4 mg via INTRAVENOUS
  Filled 2020-04-20: qty 2

## 2020-04-20 MED ORDER — HEPARIN SODIUM (PORCINE) 5000 UNIT/ML IJ SOLN
5000.0000 [IU] | Freq: Three times a day (TID) | INTRAMUSCULAR | Status: DC
  Administered 2020-04-20 – 2020-04-22 (×6): 5000 [IU] via SUBCUTANEOUS
  Filled 2020-04-20 (×6): qty 1

## 2020-04-20 MED ORDER — OXYBUTYNIN CHLORIDE ER 5 MG PO TB24
15.0000 mg | ORAL_TABLET | Freq: Every day | ORAL | Status: DC
  Administered 2020-04-20 – 2020-04-21 (×2): 15 mg via ORAL
  Filled 2020-04-20 (×3): qty 1

## 2020-04-20 MED ORDER — PENTAFLUOROPROP-TETRAFLUOROETH EX AERO
1.0000 "application " | INHALATION_SPRAY | CUTANEOUS | Status: DC | PRN
  Filled 2020-04-20: qty 30

## 2020-04-20 MED ORDER — CLOPIDOGREL BISULFATE 75 MG PO TABS
75.0000 mg | ORAL_TABLET | Freq: Every day | ORAL | Status: DC
  Administered 2020-04-20 – 2020-04-21 (×2): 75 mg via ORAL
  Filled 2020-04-20 (×2): qty 1

## 2020-04-20 MED ORDER — FUROSEMIDE 40 MG PO TABS: 80.0000 mg | ORAL_TABLET | Freq: Every day | ORAL | Status: DC

## 2020-04-20 MED ORDER — CHLORHEXIDINE GLUCONATE CLOTH 2 % EX PADS
6.0000 | MEDICATED_PAD | Freq: Every day | CUTANEOUS | Status: DC
  Filled 2020-04-20 (×3): qty 6

## 2020-04-20 NOTE — Progress Notes (Signed)
Was here earlier this month with malignant hypertension, flash pulmonary edema in setting of known aortic stenosis.  Currently on bipap and feeling a bit better.  Also hyperkalemia.  Will proceed with urgent HD in the ED.

## 2020-04-20 NOTE — H&P (Signed)
History and Physical    Phyllis [name] UQJ:[id number] DOB: October 14, 1946 DOA: 04/19/2020  PCP: [name], MD   Patient coming from: Home  I have personally briefly reviewed patient's old medical records in [place]  Chief Complaint: Shortness of breath  HPI: Phyllis [name] is a 74 y.o. female with medical history significant for end-stage renal disease on hemodialysis on Tuesdays Thursdays and Saturdays, diabetes, COPD, asthma, GERD, , anxiety with depression, diastolic dysfunction CHF and severe aortic stenosis   up-to-date Covid vaccine who presents to the emergency room with sudden onset of severe respiratory distress.  EMS recorded an O2 sats of 42% and she was placed on CPAP.  Systolic blood pressure was in the 190s.  Patient denied having missed dialysis and had her full session on 2 days prior on Saturday. She denies chest pain, cough or fever. Patient was hospitalized for the same 2 weeks prior. ED Course: Upon arrival O2 sat was in the low 90s but with RR of 28 and she was switched over to BiPAP.  Temperature 99, BP 124/46, HR 62.  Chest x-ray showed multifocal patchy interstitial opacities throughout both lungs probably due to chronic lung changes with superimposed infectious etiology and or/edema.  On her blood work she had a potassium of 6 with normal bicarb of 23.  Hemoglobin 8.8 which is her baseline.  Normal WBC.  Troponin 19, BMP 960.  Procalcitonin 0.17.  Patient who still produces urine, was treated with 80 mg Lasix.  Nephrology consult was called.  At the present time Dr. Holley Raring is making arrangements for emergency dialysis.  Hospitalist consulted for admission.  Review of Systems: As per HPI otherwise 10 point review of systems negative. Limited as patient on BiPAP. Was able to answer yes and no   Past Medical History:  Diagnosis Date  . (HFpEF) heart failure with preserved ejection fraction ([place])    a. TTE 01/2014: nl LV sys fxn, no valvular  abnormalities; b. TTE 11/16: nl EF, mild LVH;  c. 04/2019 Echo: EF 60-65%. DD. Nl RV fxn. Mod AS. 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  . Allergy   . Anemia of chronic disease   . Anxiety   . Aortic atherosclerosis ([place])   . Asthma   . Chronic back pain   . COPD (chronic obstructive pulmonary disease) ([place])   . Diabetes mellitus with complication ([place])   . ESRD on hemodialysis ([place])    a. Tues/Sat; b. 2/2 small kidneys  . Essential hypertension   . Fistula    lower left arm  . GERD (gastroesophageal reflux disease)   . Gout   . History of exercise stress test    a. 01/2014: no evidence of ischemia; b. Lexiscan 08/2015: no sig ischemia, severe GI uptake artifact, low risk; c. CPET @ [facility] 09/2016: exercised 3 min 12 sec on bike without incline, 2.28 METs, VO2 of 8.1, 48% of predicted, indicating mod to sev functional impairment, evidence of blunted HR, stroke volume, and BP augmentation as well as ventilation-perfusion mismatch with exercise  . HLD (hyperlipidemia)   . Non-obstructive Carotid arterial disease ([place])    a. 12/2017: <50% bilat ICA dzs.  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 Severe aortic stenosis    a. by 09/2019 echo b. 04/2019 Echo: Mod AS.  Marland Kitchen Sleep apnea     Past Surgical History:  Procedure Laterality Date  . CARDIAC CATHETERIZATION    . carpel  tunnel    . GALLBLADDER SURGERY    . PERIPHERAL VASCULAR CATHETERIZATION N/A 04/12/2015   Procedure: A/V Shuntogram/Fistulagram;  Surgeon: Algernon Huxley, MD;  Location: [facility];  Service: Cardiovascular;  Laterality: N/A;  . PERIPHERAL VASCULAR CATHETERIZATION N/A 04/12/2015   Procedure: A/V Shunt Intervention;  Surgeon: Algernon Huxley, MD;  Location: [facility];  Service: Cardiovascular;  Laterality: N/A;  . PERIPHERAL VASCULAR CATHETERIZATION N/A 06/09/2015   Procedure: Dialysis/Perma Catheter Removal;  Surgeon: Katha Cabal, MD;  Location: [facility];   Service: Cardiovascular;  Laterality: N/A;  . RIGHT/LEFT HEART CATH AND CORONARY ANGIOGRAPHY N/A 10/14/2019   Procedure: RIGHT/LEFT HEART CATH AND CORONARY ANGIOGRAPHY;  Surgeon: Belva Crome, MD;  Location: [facility];  Service: Cardiovascular;  Laterality: N/A;     reports that she has never smoked. She has never used smokeless tobacco. She reports that she does not drink alcohol or use drugs.  Allergies  Allergen Reactions  . Enalapril Maleate Other (See Comments)    Other reaction(s): Headache  . Nitrofurantoin Swelling and Rash    Other Reaction: swelling of body  . Sulfamethoxazole-Trimethoprim Swelling  . 2,4-D Dimethylamine (Amisol) Rash and Other (See Comments)    Other Reaction: h/a  . Baclofen Other (See Comments) and Nausea Only    lightheadness ,drowsiness , muscle weakness , twitching in hands   . Neosporin [Neomycin-Bacitracin Zn-Polymyx] Other (See Comments) and Rash    Other Reaction: irritation Skin irritation   . Quinine Nausea And Vomiting, Rash and Other (See Comments)    Other Reaction: Vomiting, rash, h/a, vision  . Ultram [Tramadol] Palpitations  . Zocor [Simvastatin] Other (See Comments) and Rash    Other Reaction: muscle spasms Muscle pain and spasms  . Bactrim [Sulfamethoxazole-Trimethoprim] Swelling  . Levodopa Other (See Comments)    Reaction: unknown  . Macrodantin [Nitrofurantoin Macrocrystal] Swelling  . Quinine Derivatives Other (See Comments)    Vertigo,nausea vomiting blurred vision headache ears sensitivity     Family History  Problem Relation Age of Onset  . Hypertension Mother   . Hyperlipidemia Mother   . Heart disease Father   . Heart attack Father 64  . Hypertension Father   . Hyperlipidemia Father   . Heart disease Brother        CABG   . Heart attack Brother   . Breast cancer Neg Hx      Prior to Admission medications   Medication Sig Start Date End Date Taking? Authorizing Provider  acetaminophen (TYLENOL) 500  MG tablet Take 1 tablet (500 mg total) by mouth every 6 (six) hours as needed for mild pain or fever. 09/29/19   Danford, Suann Larry, MD  albuterol (PROVENTIL HFA;VENTOLIN HFA) 108 (90 Base) MCG/ACT inhaler Inhale 2 puffs into the lungs every 6 (six) hours as needed for wheezing or shortness of breath. Out of medication    [provider]  amLODipine (NORVASC) 10 MG tablet Take 10 mg by mouth daily.  12/29/13   [provider]  ASPIRIN 81 PO Take 1 tablet by mouth daily. 01/27/20   [provider]  calcium acetate (PHOSLO) 667 MG capsule Take 2 capsules by mouth in the morning, at noon, and at bedtime. 12/31/19   [provider]  carvedilol (COREG) 12.5 MG tablet Take 12.5 mg by mouth 2 (two) times daily with a meal.  01/09/14   [provider]  cetirizine (ZYRTEC) 10 MG tablet Take 10 mg by mouth  daily as needed for allergies.     [provider]  clopidogrel (PLAVIX) 75 MG tablet Take 1 tablet (75 mg total) by mouth daily. 01/18/18   Epifanio Lesches, MD  [facility] MELATONIN 5 MG CHEW Chew 1 tablet by mouth at bedtime. 02/02/20   [provider]  epoetin alfa (EPOGEN) 10000 UNIT/ML injection Inject 0.4 mLs (4,000 Units total) into the vein Every Tuesday,Thursday,and Saturday with dialysis. 04/03/20   Nolberto Hanlon, MD  furosemide (LASIX) 80 MG tablet Take 1 tablet (80 mg total) by mouth daily. 04/02/20 04/02/21  Nolberto Hanlon, MD  hydrALAZINE (APRESOLINE) 50 MG tablet TAKE 1 TABLET BY MOUTH EVERY 8 HOURS 04/02/20   Gollan, Kathlene November, MD  HYDROcodone-acetaminophen (NORCO) 7.5-325 MG tablet Take 1 tablet by mouth every 6 (six) hours as needed (pain). 10/08/19   Danford, Suann Larry, MD  ipratropium-albuterol (DUONEB) 0.5-2.5 (3) MG/3ML SOLN Take 3 mLs by nebulization every 6 (six) hours as needed. 10/17/19   [facility], Tami Lin, PA  iron polysaccharides (NIFEREX) 150 MG capsule Take 150 mg by mouth daily. 01/09/20 01/08/21  [provider]    lidocaine (LIDODERM) 5 % Place 1 patch onto the skin daily. Remove & Discard patch within 12 hours or as directed by MD 10/14/19   Flora Lipps, MD  lidocaine-prilocaine (EMLA) cream Apply 1 application topically as needed (prior to treatment).     [provider]  losartan (COZAAR) 50 MG tablet Take 50 mg by mouth daily. 03/18/19   [provider]  mirtazapine (REMERON) 15 MG tablet Take 15 mg by mouth at bedtime. 02/25/20   [provider]  oxybutynin (DITROPAN XL) 15 MG 24 hr tablet Take 15 mg by mouth daily. 12/24/17   [provider]  pravastatin (PRAVACHOL) 40 MG tablet Take 40 mg by mouth at bedtime.     [provider]  sertraline (ZOLOFT) 50 MG tablet Take 50 mg by mouth at bedtime. 03/02/20   [provider]  topiramate (TOPAMAX) 25 MG tablet Take 1 tablet (25 mg total) by mouth as needed (for headaches). 09/07/19   Henreitta Leber, MD    Physical Exam: Vitals:   04/20/20 0015 04/20/20 0018 04/20/20 0100 04/20/20 0101  BP: (!) 109/50  (!) 110/32 (!) 110/32  Pulse: 61  (!) 56 88  Resp: (!) 27  13 14   Temp:   98.5 F (36.9 C) 98.5 F (36.9 C)  TempSrc:    Core  SpO2: 97%  97% 97%  Weight:  56.8 kg    Height:  4\' 10"  (1.473 m)       Vitals:   04/20/20 0015 04/20/20 0018 04/20/20 0100 04/20/20 0101  BP: (!) 109/50  (!) 110/32 (!) 110/32  Pulse: 61  (!) 56 88  Resp: (!) 27  13 14   Temp:   98.5 F (36.9 C) 98.5 F (36.9 C)  TempSrc:    Core  SpO2: 97%  97% 97%  Weight:  56.8 kg    Height:  4\' 10"  (1.473 m)        Constitutional: Alert and oriented x 3 . On BiPAP HEENT:      Head: Normocephalic and atraumatic.         Eyes: PERLA, EOMI, Conjunctivae are normal. Sclera is non-icteric.       Mouth/Throat: Mucous membranes are moist. On BiPAP      Neck: Supple with no signs of meningismus. Cardiovascular: Regular rate and rhythm. 3/6 murmurs, no gallops, or rubs. 2+ symmetrical  distal pulses are present . No JVD. No LE  edema Respiratory: Respiratory effort increased.Lungs sounds diminished bilaterally. Bibasilar rales Gastrointestinal: Soft, non tender, and non distended with positive bowel sounds. No rebound or guarding. Genitourinary: No CVA tenderness. Musculoskeletal: Nontender with normal range of motion in all extremities. No edema, cyanosis, or erythema of extremities. Neurologic: Normal speech and language. Face is symmetric. Moving all extremities. No gross focal neurologic deficits . Skin: Skin is warm, dry.  No rash or ulcers Psychiatric: Mood and affect are normal Speech and behavior are normal   Labs on Admission: I have personally reviewed following labs and imaging studies  CBC: Recent Labs  Lab 04/19/20 2338  WBC 8.0  NEUTROABS 5.5  HGB 8.8*  HCT 27.5*  MCV 92.6  PLT 433   Basic Metabolic Panel: Recent Labs  Lab 04/19/20 2338  NA 134*  K 6.0*  CL 98  CO2 23  GLUCOSE 189*  BUN 60*  CREATININE 6.88*  CALCIUM 8.6*   GFR: Estimated Creatinine Clearance: 5.4 mL/min (A) (by C-G formula based on SCr of 6.88 mg/dL (H)). Liver Function Tests: Recent Labs  Lab 04/19/20 2338  AST 18  ALT 9  ALKPHOS 115  BILITOT 0.7  PROT 6.7  ALBUMIN 4.0   No results for input(s): LIPASE, AMYLASE in the last 168 hours. No results for input(s): AMMONIA in the last 168 hours. Coagulation Profile: No results for input(s): INR, PROTIME in the last 168 hours. Cardiac Enzymes: No results for input(s): CKTOTAL, CKMB, CKMBINDEX, TROPONINI in the last 168 hours. BNP (last 3 results) No results for input(s): PROBNP in the last 8760 hours. HbA1C: No results for input(s): HGBA1C in the last 72 hours. CBG: No results for input(s): GLUCAP in the last 168 hours. Lipid Profile: No results for input(s): CHOL, HDL, LDLCALC, TRIG, CHOLHDL, LDLDIRECT in the last 72 hours. Thyroid Function Tests: No results for input(s): TSH, T4TOTAL, FREET4, T3FREE, THYROIDAB in the last 72 hours. Anemia Panel: No  results for input(s): VITAMINB12, FOLATE, FERRITIN, TIBC, IRON, RETICCTPCT in the last 72 hours. Urine analysis:    Component Value Date/Time   COLORURINE YELLOW (A) 12/04/2019 2054   APPEARANCEUR CLOUDY (A) 12/04/2019 2054   LABSPEC 1.008 12/04/2019 2054   PHURINE 8.0 12/04/2019 2054   GLUCOSEU NEGATIVE 12/04/2019 2054   HGBUR MODERATE (A) 12/04/2019 2054   BILIRUBINUR NEGATIVE 12/04/2019 2054   [place] 12/04/2019 2054   PROTEINUR 100 (A) 12/04/2019 2054   NITRITE NEGATIVE 12/04/2019 2054   LEUKOCYTESUR LARGE (A) 12/04/2019 2054    Radiological Exams on Admission: DG Chest Portable 1 View  Result Date: 04/19/2020 CLINICAL DATA:  Shortness of breath EXAM: PORTABLE CHEST 1 VIEW COMPARISON:  Apr 01, 2020 FINDINGS: Again noted is mild cardiomegaly. Aortic knob calcifications. Multifocal hazy airspace opacity and diffuse increased interstitial opacity seen throughout both lungs, this does not appear to be significantly changed since the prior exam. No acute osseous findings. IMPRESSION: Stable multifocal patchy interstitial opacities throughout both lungs which may be due to chronic lung changes with superimposed infectious etiology and/or edema. Electronically Signed   By: Prudencio Pair M.D.   On: 04/19/2020 23:56    EKG: Independently reviewed.   Assessment/Plan Principal Problem: Acute respiratory failure with hypoxia -Suspect related to acute pulmonary edema.  Lower suspicion for acute infectious process. -Chest x-ray shows stable multifocal patchy interstitial opacities throughout both lungs which may be chronic -Patient does have history of COVID-19.  Has been vaccinated -Continue BiPAP and wean as tolerated -  Should improve with emergent dialysis  Flash pulmonary edema (HCC) in the setting of history of severe aortic stenosis   Acute on chronic diastolic CHF (congestive heart failure) ([place]) -Secondary to fluid overload though patient has been adherent to the dialysis.   Was last dialyzed 2 days prior -Should improve with dialysis -Continue IV Lasix until dialysis initiated -Daily weights with intake and output monitoring -Supplemental oxygen as above -Continue home carvedilol, losartan    Hyperkalemia -Patient was treated with insulin and glucose as well as Lokelma in the emergency room -Expect further improvement with dialysis -Continue to monitor    ESRD (end stage renal disease) on dialysis ([place])   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-stage renal disease) ([place]) -Patient is on TTS dialysis schedule and has not missed any sessions -Nephrology consult for emergency dialysis and continuation thereafter -Hemoglobin 8.8 which is baseline    Type 2 diabetes mellitus with other specified complication (HCC) -Regular insulin sliding scale coverage pending med rec    CAD (coronary artery disease) -No complaints of chest pain.  Troponin minimally elevated at 19 -Continue home clopidogrel, aspirin, pravastatin, carvedilol    DVT prophylaxis: Heparin Code Status: full code  Family Communication: Friend and POA at bedside Marlene Lard Disposition Plan: Back to previous home environment Consults called: Nephrology Status:At the time of admission, it appears that the appropriate admission status for this patient is INPATIENT. This is judged to be reasonable and necessary in order to provide the required intensity of service to ensure the patient's safety given the presenting symptoms, physical exam findings, and initial radiographic and laboratory data in the context of their  Comorbid conditions.   Patient requires inpatient status due to high intensity of service, high risk for further deterioration and high frequency of surveillance required.   I certify that at the point of admission it is my clinical judgment that the patient will require inpatient hospital care spanning beyond [place] MD [facility]     04/20/2020, 1:39 AM

## 2020-04-20 NOTE — ED Notes (Signed)
Pt's POA Jane at bedside and updated on pt's condition

## 2020-04-20 NOTE — ED Provider Notes (Signed)
[facility] Emergency Department Provider Note  ____________________________________________  Time seen: Approximately 12:17 AM  I have reviewed the triage vital signs and the nursing notes.   HISTORY  Chief Complaint Shortness of Breath  Level 5 caveat:  Portions of the history and physical were unable to be obtained due to severe respiratory distress   HPI Phyllis [name] is a 74 y.o. female with a history HFpEF, COPD, ESRD on HD (TTS), anemia of chronic disease, diabetes, hypertension, hyperlipidemia who presents for evaluation of shortness of breath.  Patient with acute sudden onset of shortness of breath this evening.  Satting in the 70s per EMS with systolics in the 177L.  Transported on CPAP.  No fevers or cough.  Symptoms constant and severe since onset.  Patient is status post Covid vaccine x2.  Has not missed dialysis with her last treatment being 2 days ago.   Past Medical History:  Diagnosis Date  . (HFpEF) heart failure with preserved ejection fraction ([place])    a. TTE 01/2014: nl LV sys fxn, no valvular abnormalities; b. TTE 11/16: nl EF, mild LVH;  c. 04/2019 Echo: EF 60-65%. DD. Nl RV fxn. Mod AS. 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  . Allergy   . Anemia of chronic disease   . Anxiety   . Aortic atherosclerosis ([place])   . Asthma   . Chronic back pain   . COPD (chronic obstructive pulmonary disease) ([place])   . Diabetes mellitus with complication ([place])   . ESRD on hemodialysis ([place])    a. Tues/Sat; b. 2/2 small kidneys  . Essential hypertension   . Fistula    lower left arm  . GERD (gastroesophageal reflux disease)   . Gout   . History of exercise stress test    a. 01/2014: no evidence of ischemia; b. Lexiscan 08/2015: no sig ischemia, severe GI uptake artifact, low risk; c. CPET @ [facility] 09/2016: exercised 3 min 12 sec on bike without incline, 2.28 METs, VO2 of 8.1, 48% of predicted, indicating mod to sev functional  impairment, evidence of blunted HR, stroke volume, and BP augmentation as well as ventilation-perfusion mismatch with exercise  . HLD (hyperlipidemia)   . Non-obstructive Carotid arterial disease ([place])    a. 12/2017: <50% bilat ICA dzs.  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 Severe aortic stenosis    a. by 09/2019 echo b. 04/2019 Echo: Mod AS.  Marland Kitchen Sleep apnea     Patient Active Problem List   Diagnosis Date Noted  . Acute on chronic respiratory failure with hypoxemia ([place]) 03/29/2020  . Asymptomatic bacteriuria 12/05/2019  . Acute respiratory failure ([place]) 10/23/2019  . COPD with acute exacerbation ([place]) 10/23/2019  . Elevated troponin 10/23/2019  . CAD (coronary artery disease) 10/23/2019  . COPD exacerbation ([place]) 10/23/2019  . Leukocytosis 10/23/2019  . Anxiety 10/23/2019  . Palliative care by specialist   . Goals of care, counseling/discussion   . Flash pulmonary edema ([place]) 10/14/2019  . Respiratory failure with hypoxia ([place]) 10/11/2019  . 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-stage renal disease) ([place])   . Acute on chronic respiratory failure with hypoxia ([place]) 10/01/2019  . Severe aortic valve stenosis   . Acute respiratory failure with hypoxemia ([place]) 09/20/2019  . COPD with acute bronchitis ([place]) 09/05/2019  . Congestive heart failure ([place]) 05/24/2019  . Hyperkalemia 11/29/2018  . Chronic diastolic heart failure ([place]) 11/02/2018  . HTN (hypertension) 11/02/2018  . Uncontrolled  hypertension 10/21/2018  . History of acute pulmonary edema 10/21/2018  . Pressure injury of skin 03/16/2018  . CVA (cerebral vascular accident) ([place]) 01/16/2018  . Aortic atherosclerosis ([place]) 12/11/2017  . Chest pain 09/18/2017  . Hyperlipidemia 08/06/2015  . Complication from renal dialysis device 04/12/2015  . SOB (shortness of breath) 02/01/2015  . ESRD (end stage renal disease) on dialysis ([place]) 02/01/2015  . Type 2 diabetes mellitus with other specified complication ([place])  63/87/5643  . Asthma 02/01/2015  . Acute on chronic diastolic CHF (congestive heart failure) ([place]) 02/01/2015    Past Surgical History:  Procedure Laterality Date  . CARDIAC CATHETERIZATION    . carpel tunnel    . GALLBLADDER SURGERY    . PERIPHERAL VASCULAR CATHETERIZATION N/A 04/12/2015   Procedure: A/V Shuntogram/Fistulagram;  Surgeon: Algernon Huxley, MD;  Location: [facility];  Service: Cardiovascular;  Laterality: N/A;  . PERIPHERAL VASCULAR CATHETERIZATION N/A 04/12/2015   Procedure: A/V Shunt Intervention;  Surgeon: Algernon Huxley, MD;  Location: [facility];  Service: Cardiovascular;  Laterality: N/A;  . PERIPHERAL VASCULAR CATHETERIZATION N/A 06/09/2015   Procedure: Dialysis/Perma Catheter Removal;  Surgeon: Katha Cabal, MD;  Location: [facility];  Service: Cardiovascular;  Laterality: N/A;  . RIGHT/LEFT HEART CATH AND CORONARY ANGIOGRAPHY N/A 10/14/2019   Procedure: RIGHT/LEFT HEART CATH AND CORONARY ANGIOGRAPHY;  Surgeon: Belva Crome, MD;  Location: [facility];  Service: Cardiovascular;  Laterality: N/A;    Prior to Admission medications   Medication Sig Start Date End Date Taking? Authorizing Provider  acetaminophen (TYLENOL) 500 MG tablet Take 1 tablet (500 mg total) by mouth every 6 (six) hours as needed for mild pain or fever. 09/29/19   Danford, Suann Larry, MD  albuterol (PROVENTIL HFA;VENTOLIN HFA) 108 (90 Base) MCG/ACT inhaler Inhale 2 puffs into the lungs every 6 (six) hours as needed for wheezing or shortness of breath. Out of medication    [provider]  amLODipine (NORVASC) 10 MG tablet Take 10 mg by mouth daily.  12/29/13   [provider]  ASPIRIN 81 PO Take 1 tablet by mouth daily. 01/27/20   [provider]  calcium acetate (PHOSLO) 667 MG capsule Take 2 capsules by mouth in the morning, at noon, and at bedtime. 12/31/19   [provider]  carvedilol (COREG) 12.5 MG tablet Take 12.5 mg by  mouth 2 (two) times daily with a meal.  01/09/14   [provider]  cetirizine (ZYRTEC) 10 MG tablet Take 10 mg by mouth daily as needed for allergies.     [provider]  clopidogrel (PLAVIX) 75 MG tablet Take 1 tablet (75 mg total) by mouth daily. 01/18/18   Epifanio Lesches, MD  [facility] MELATONIN 5 MG CHEW Chew 1 tablet by mouth at bedtime. 02/02/20   [provider]  epoetin alfa (EPOGEN) 10000 UNIT/ML injection Inject 0.4 mLs (4,000 Units total) into the vein Every Tuesday,Thursday,and Saturday with dialysis. 04/03/20   Nolberto Hanlon, MD  furosemide (LASIX) 80 MG tablet Take 1 tablet (80 mg total) by mouth daily. 04/02/20 04/02/21  Nolberto Hanlon, MD  hydrALAZINE (APRESOLINE) 50 MG tablet TAKE 1 TABLET BY MOUTH EVERY 8 HOURS 04/02/20   Gollan, Kathlene November, MD  HYDROcodone-acetaminophen (NORCO) 7.5-325 MG tablet Take 1 tablet by mouth every 6 (six) hours as needed (pain). 10/08/19   Danford, Suann Larry, MD  ipratropium-albuterol (DUONEB) 0.5-2.5 (3) MG/3ML SOLN Take 3 mLs by nebulization every 6 (six) hours as  needed. 10/17/19   [facility], Tami Lin, PA  iron polysaccharides (NIFEREX) 150 MG capsule Take 150 mg by mouth daily. 01/09/20 01/08/21  [provider]  lidocaine (LIDODERM) 5 % Place 1 patch onto the skin daily. Remove & Discard patch within 12 hours or as directed by MD 10/14/19   Flora Lipps, MD  lidocaine-prilocaine (EMLA) cream Apply 1 application topically as needed (prior to treatment).     [provider]  losartan (COZAAR) 50 MG tablet Take 50 mg by mouth daily. 03/18/19   [provider]  mirtazapine (REMERON) 15 MG tablet Take 15 mg by mouth at bedtime. 02/25/20   [provider]  oxybutynin (DITROPAN XL) 15 MG 24 hr tablet Take 15 mg by mouth daily. 12/24/17   [provider]  pravastatin (PRAVACHOL) 40 MG tablet Take 40 mg by mouth at bedtime.     [provider]  sertraline (ZOLOFT) 50 MG tablet Take 50 mg by  mouth at bedtime. 03/02/20   [provider]  topiramate (TOPAMAX) 25 MG tablet Take 1 tablet (25 mg total) by mouth as needed (for headaches). 09/07/19   Henreitta Leber, MD    Allergies Enalapril maleate; Nitrofurantoin; Sulfamethoxazole-trimethoprim; 2,4-d dimethylamine (amisol); Baclofen; Neosporin [neomycin-bacitracin zn-polymyx]; Quinine; Ultram [tramadol]; Zocor [simvastatin]; Bactrim [sulfamethoxazole-trimethoprim]; Levodopa; Macrodantin [nitrofurantoin macrocrystal]; and Quinine derivatives  Family History  Problem Relation Age of Onset  . Hypertension Mother   . Hyperlipidemia Mother   . Heart disease Father   . Heart attack Father 31  . Hypertension Father   . Hyperlipidemia Father   . Heart disease Brother        CABG   . Heart attack Brother   . Breast cancer Neg Hx     Social History Social History   Tobacco Use  . Smoking status: Never Smoker  . Smokeless tobacco: Never Used  Substance Use Topics  . Alcohol use: No  . Drug use: No    Review of Systems  Constitutional: Negative for fever. Eyes: Negative for visual changes. ENT: Negative for sore throat. Neck: No neck pain  Cardiovascular: Negative for chest pain. Respiratory: + shortness of breath. Gastrointestinal: Negative for abdominal pain, vomiting or diarrhea. Genitourinary: Negative for dysuria. Musculoskeletal: Negative for back pain. Skin: Negative for rash. Neurological: Negative for headaches, weakness or numbness. Psych: No SI or HI  ____________________________________________   PHYSICAL EXAM:  VITAL SIGNS: ED Triage Vitals [04/19/20 2335]  Enc Vitals Group     BP (!) 124/46     Pulse Rate 62     Resp (!) 28     Temp      Temp src      SpO2 94 %     Weight      Height      Head Circumference      Peak Flow      Pain Score      Pain Loc      Pain Edu?      Excl. in [place]?     Constitutional: Alert and oriented in severe respiratory distress  HEENT:      Head:  Normocephalic and atraumatic.         Eyes: Conjunctivae are normal. Sclera is non-icteric.       Mouth/Throat: Mucous membranes are moist.       Neck: Supple with no signs of meningismus. Cardiovascular: Regular rate and rhythm. No murmurs, gallops, or rubs. 2+ symmetrical distal pulses are present in all extremities.  Respiratory:  Severe respiratory distress, tachypneic, on CPAP, diffuse coarse rhonchi bilaterally with good air movement and no wheezing.   Gastrointestinal: Soft, non tender, and non distended with positive bowel sounds.  Musculoskeletal: Trace pitting edema bilaterally  neurologic: Normal speech and language. Face is symmetric. Moving all extremities. No gross focal neurologic deficits are appreciated. Skin: Skin is warm, dry and intact. No rash noted. Psychiatric: Mood and affect are normal. Speech and behavior are normal.  ____________________________________________   LABS (all labs ordered are listed, but only abnormal results are displayed)  Labs Reviewed  CBC WITH DIFFERENTIAL/PLATELET - Abnormal; Notable for the following components:      Result Value   RBC 2.97 (*)    Hemoglobin 8.8 (*)    HCT 27.5 (*)    All other components within normal limits  COMPREHENSIVE METABOLIC PANEL - Abnormal; Notable for the following components:   Sodium 134 (*)    Potassium 6.0 (*)    Glucose, Bld 189 (*)    BUN 60 (*)    Creatinine, Ser 6.88 (*)    Calcium 8.6 (*)    GFR calc non Af Amer 5 (*)    GFR calc Af Amer 6 (*)    All other components within normal limits  BRAIN NATRIURETIC PEPTIDE - Abnormal; Notable for the following components:   B Natriuretic Peptide 960.8 (*)    All other components within normal limits  BLOOD GAS, VENOUS - Abnormal; Notable for the following components:   pO2, Ven 80.0 (*)    Acid-base deficit 2.2 (*)    All other components within normal limits  TROPONIN I (HIGH SENSITIVITY) - Abnormal; Notable for the following components:   Troponin  I (High Sensitivity) 19 (*)    All other components within normal limits  SARS CORONAVIRUS 2 BY RT PCR (HOSPITAL ORDER, [facility])  LACTIC ACID, PLASMA  PROCALCITONIN  LACTIC ACID, PLASMA  TROPONIN I (HIGH SENSITIVITY)   ____________________________________________  EKG  ED ECG REPORT I, Rudene Re, the attending physician, personally viewed and interpreted this ECG.  Normal sinus rhythm, rate of 63, right bundle branch block, normal QTC, normal axis, no ST elevations or depressions.  No significant changes when compared to prior. ____________________________________________  RADIOLOGY  I have personally reviewed the images performed during this visit and I agree with the Radiologist's read.   Interpretation by Radiologist:  DG Chest Portable 1 View  Result Date: 04/19/2020 CLINICAL DATA:  Shortness of breath EXAM: PORTABLE CHEST 1 VIEW COMPARISON:  Apr 01, 2020 FINDINGS: Again noted is mild cardiomegaly. Aortic knob calcifications. Multifocal hazy airspace opacity and diffuse increased interstitial opacity seen throughout both lungs, this does not appear to be significantly changed since the prior exam. No acute osseous findings. IMPRESSION: Stable multifocal patchy interstitial opacities throughout both lungs which may be due to chronic lung changes with superimposed infectious etiology and/or edema. Electronically Signed   By: Prudencio Pair M.D.   On: 04/19/2020 23:56     ____________________________________________   PROCEDURES  Procedure(s) performed:yes .1-3 Lead EKG Interpretation Performed by: Rudene Re, MD Authorized by: Rudene Re, MD     Interpretation: normal     ECG rate assessment: normal     Rhythm: sinus rhythm     Ectopy: none     Conduction normal: RBBB.     Critical Care performed: yes  CRITICAL CARE Performed by: Rudene Re  ?  Total critical care time: 45 min  Critical care time was  exclusive of separately billable procedures and treating other patients.  Critical care was necessary to treat or prevent imminent or life-threatening deterioration.  Critical care was time spent personally by me on the following activities: development of treatment plan with patient and/or surrogate as well as nursing, discussions with consultants, evaluation of patient's response to treatment, examination of patient, obtaining history from patient or surrogate, ordering and performing treatments and interventions, ordering and review of laboratory studies, ordering and review of radiographic studies, pulse oximetry and re-evaluation of patient's condition.  ____________________________________________   INITIAL IMPRESSION / ASSESSMENT AND PLAN / ED COURSE   74 y.o. female with a history HFpEF, COPD, ESRD on HD (TTS), anemia of chronic disease, diabetes, hypertension, hyperlipidemia who presents for evaluation of severe sudden shortness of breath.  Patient extremely hypertensive and hypoxic when EMS arrived with coarse rhonchi concerning for flash pulmonary edema.  Has not missed dialysis.  Patient arrives in severe respiratory distress on CPAP.  Transition immediately to BiPAP.  Patient still makes some urine therefore Foley catheter and 80 mg of IV Lasix were given.  Pressure had significantly improved by the time patient arrived with systolics in the 412I therefore nitro drip was held.  Patient was also given duo nebs and Solu-Medrol due to history of COPD.  Differential diagnosis including flash pulmonary edema versus pneumonia versus Covid versus PE versus ACS.  EKG with no signs of acute dysrhythmia or ischemic changes.  Patient placed on telemetry for close monitoring.  Old medical records visualized.  Labs and imaging pending.  _________________________ 1:21 AM on 04/20/2020 -----------------------------------------  Patient afebrile with negative procalcitonin and no leukocytosis therefore  will hold antibiotics.  Labs showing potassium of 6 with no EKG changes.  Due to pulmonary edema, hyperkalemia, and respiratory failure, nephrology was consulted.  Spoke with Dr. Holley Raring who will try to dialyze patient emergently in the emergency department.  Will discuss with hospitalist for admission.  Patient reassessed with significantly improved respiratory status.  Still on BiPAP.    _____________________________________________ Please note:  Patient was evaluated in Emergency Department today for the symptoms described in the history of present illness. Patient was evaluated in the context of the global COVID-19 pandemic, which necessitated consideration that the patient might be at risk for infection with the SARS-CoV-2 virus that causes COVID-19. Institutional protocols and algorithms that pertain to the evaluation of patients at risk for COVID-19 are in a state of rapid change based on information released by regulatory bodies including the CDC and federal and state organizations. These policies and algorithms were followed during the patient's care in the ED.  Some ED evaluations and interventions may be delayed as a result of limited staffing during the pandemic.    Controlled Substance Database was reviewed by me. ____________________________________________   FINAL CLINICAL IMPRESSION(S) / ED DIAGNOSES   Final diagnoses:  Acute respiratory failure with hypoxia (HCC)  Hyperkalemia      NEW MEDICATIONS STARTED DURING THIS VISIT:  ED Discharge Orders    None       Note:  This document was prepared using Dragon voice recognition software and may include unintentional dictation errors.    Alfred Levins, [place], MD 04/20/20 [phone]

## 2020-04-20 NOTE — Progress Notes (Signed)
Tx start

## 2020-04-20 NOTE — TOC Initial Note (Signed)
Transition of Care [facility]) - Initial/Assessment Note    Patient Details  Name: Phyllis [name] MRN: [id number] Date of Birth: 03-03-1946  [facility]) CM/SW Contact:    Anselm Pancoast, RN Phone Number: 04/20/2020, 1:15 PM  Clinical Narrative:                 Spoke with patient at bedside. Patient states she lives home alone with paid caregivers 24/7 and was receiving services through [facility] as well. Patient states she has no other needs other than having increased right sided chest pain. ED RN notified of chest pain.   Expected Discharge Plan: [place] Barriers to Discharge: Continued Medical Work up   Patient Goals and CMS Choice Patient states their goals for this hospitalization and ongoing recovery are:: Get back home and feel better      Expected Discharge Plan and Services Expected Discharge Plan: [place]   Discharge Planning Services: NA   Living arrangements for the past 2 months: Single Family Home                                      Prior Living Arrangements/Services Living arrangements for the past 2 months: Single Family Home Lives with:: Self, Other (Comment)(24 hour paid caregiver) Patient language and need for interpreter reviewed:: Yes Do you feel safe going back to the place where you live?: Yes      Need for Family Participation in Patient Care: Yes (Comment) Care giver support system in place?: Yes (comment) Current home services: Homehealth aide Criminal Activity/Legal Involvement Pertinent to Current Situation/Hospitalization: No - Comment as needed  Activities of Daily Living      Permission Sought/Granted Permission sought to share information with : Facility Sport and exercise psychologist, Case Optician, dispensing granted to share information with : Yes, Verbal Permission Granted  Share Information with NAME: [facility] Department  Permission granted to share info w AGENCY: [facility]        Emotional Assessment Appearance:: Appears stated age Attitude/Demeanor/Rapport: Engaged Affect (typically observed): Accepting Orientation: : Oriented to Self, Oriented to Place, Oriented to  Time, Oriented to Situation Alcohol / Substance Use: Never Used Psych Involvement: No (comment)  Admission diagnosis:  Flash pulmonary edema ([place]) [J81.0] Patient Active Problem List   Diagnosis Date Noted  . Acute on chronic respiratory failure with hypoxemia ([place]) 03/29/2020  . Asymptomatic bacteriuria 12/05/2019  . Acute respiratory failure ([place]) 10/23/2019  . COPD with acute exacerbation ([place]) 10/23/2019  . Elevated troponin 10/23/2019  . CAD (coronary artery disease) 10/23/2019  . COPD exacerbation ([place]) 10/23/2019  . Leukocytosis 10/23/2019  . Anxiety 10/23/2019  . Palliative care by specialist   . Goals of care, counseling/discussion   . Flash pulmonary edema ([place]) 10/14/2019  . Respiratory failure with hypoxia ([place]) 10/11/2019  . 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-stage renal disease) ([place])   . Acute on chronic respiratory failure with hypoxia ([place]) 10/01/2019  . Severe aortic valve stenosis   . Acute respiratory failure with hypoxemia ([place]) 09/20/2019  . COPD with acute bronchitis ([place]) 09/05/2019  . Congestive heart failure ([place]) 05/24/2019  . Hyperkalemia 11/29/2018  . Chronic diastolic heart failure ([place]) 11/02/2018  . HTN (hypertension) 11/02/2018  . Uncontrolled hypertension 10/21/2018  . History of acute pulmonary edema 10/21/2018  . Pressure injury of skin 03/16/2018  . CVA (cerebral vascular accident) ([place])  01/16/2018  . Aortic atherosclerosis ([place]) 12/11/2017  . Chest pain 09/18/2017  . Hyperlipidemia 08/06/2015  . Complication from renal dialysis device 04/12/2015  . SOB (shortness of breath) 02/01/2015  . ESRD (end stage renal disease) on dialysis ([place]) 02/01/2015  . Type 2 diabetes mellitus with other specified complication ([place]) 15/95/3967  . Asthma 02/01/2015   . Acute on chronic diastolic CHF (congestive heart failure) ([place]) 02/01/2015   PCP:  [name], MD Pharmacy:   [facility] [place] [address] [place] [address] Phone: [phone] Fax: [place], [place] [address] [place] [address] Phone: [phone] Fax: [phone]  [id number] [place] [place] - [address] [place] [address] Phone: [phone] Fax: [phone]     Social Determinants of Health (SDOH) Interventions    Readmission Risk Interventions Readmission Risk Prevention Plan 03/31/2020 10/13/2019 09/26/2019  Transportation Screening Complete - Complete  Medication Review Press photographer) Complete - Complete  PCP or Specialist appointment within 3-5 days of discharge - Complete Complete  HRI or [place] - (No Data) Complete  SW Recovery Care/Counseling Consult - Complete Complete  Palliative Care Screening - Not Applicable Not Applicable  Skilled Nursing Facility - Complete Not Applicable  Some recent data might be hidden

## 2020-04-20 NOTE — ED Notes (Signed)
Patient called out c/o difficulty breathing. Wheezing noted. Patient refused to try breathing treatment and wanted bi-pap placed back on. Oxygen levels dropped between 86%-88%. Bi-pap placed back on patient and breathing treatments given through bi-pap. Patient reports improvement in breathing and oxygen level is 97% at this time

## 2020-04-20 NOTE — ED Notes (Signed)
Patient given lunch tray.

## 2020-04-20 NOTE — ED Notes (Signed)
Cardiology at bedside for echo.

## 2020-04-20 NOTE — ED Notes (Signed)
PAtient taken off bi-pap and placed on nasal canula to see if can tolerate off bi-pap per MD Ilona Sorrel

## 2020-04-20 NOTE — ED Notes (Signed)
PAtients caregiver monica at bedside

## 2020-04-20 NOTE — Progress Notes (Signed)
bipap on standby. Patient in no distress. On cannula. Patient states breathing is good off bipap

## 2020-04-20 NOTE — ED Notes (Signed)
Report called to chris rn cpod.  Pt moved to room 32

## 2020-04-20 NOTE — ED Notes (Signed)
Caregiver Monica in room reports she will call Maggie Schwalbe (patients caregiver) and update her on patients status

## 2020-04-20 NOTE — ED Notes (Signed)
Patient given crackers

## 2020-04-20 NOTE — Progress Notes (Signed)
Tx end

## 2020-04-20 NOTE — ED Notes (Signed)
Pt placed on Bipap at this time and taken off of EMS's CPAP machine

## 2020-04-20 NOTE — ED Notes (Signed)
Resumed care from amber rn.  Pt alert.  Pt taken off bipap by rt just now.  Pt placed on 1 liter oxygen [place].

## 2020-04-20 NOTE — Progress Notes (Signed)
*  PRELIMINARY RESULTS* Echocardiogram 2D Echocardiogram has been performed.  Phyllis [name] 04/20/2020, 9:20 AM

## 2020-04-20 NOTE — ED Notes (Signed)
meds given  Pt watching tv.  No acute distress.

## 2020-04-20 NOTE — ED Notes (Signed)
Pt resting comfortably at this time with no complaints

## 2020-04-20 NOTE — Progress Notes (Signed)
She is feeling better today.  She said her breathing has improved.  She is still on BiPAP.  She said she has been having right sided chest wall pain from costochondritis.  No other complaints.  Discontinue BiPAP and transition to oxygen via nasal cannula.  Continue current medications.  Patient will be downgraded from stepdown unit status to MedSurg unit status.  Her private caregiver was at the bedside.   Phyllis [name]  [facility]     04/20/2020, 9:17 AM

## 2020-04-20 NOTE — ED Notes (Signed)
Dialysis at bedside

## 2020-04-21 LAB — GLUCOSE, CAPILLARY
Glucose-Capillary: 91 mg/dL (ref 70–99)
Glucose-Capillary: 113 mg/dL — ABNORMAL HIGH (ref 70–99)
Glucose-Capillary: 163 mg/dL — ABNORMAL HIGH (ref 70–99)
Glucose-Capillary: 103 mg/dL — ABNORMAL HIGH (ref 70–99)

## 2020-04-21 LAB — BASIC METABOLIC PANEL
Anion gap: 14 (ref 5–15)
BUN: 36 mg/dL — ABNORMAL HIGH (ref 8–23)
CO2: 31 mmol/L (ref 22–32)
Calcium: 9 mg/dL (ref 8.9–10.3)
Chloride: 95 mmol/L — ABNORMAL LOW (ref 98–111)
Creatinine, Ser: 4.78 mg/dL — ABNORMAL HIGH (ref 0.44–1.00)
GFR calc Af Amer: 10 mL/min — ABNORMAL LOW (ref >60)
GFR calc non Af Amer: 8 mL/min — ABNORMAL LOW (ref >60)
Glucose, Bld: 110 mg/dL — ABNORMAL HIGH (ref 70–99)
Potassium: 4.8 mmol/L (ref 3.5–5.1)
Sodium: 140 mmol/L (ref 135–145)

## 2020-04-21 LAB — MRSA PCR SCREENING: MRSA by PCR: NEGATIVE

## 2020-04-21 LAB — PHOSPHORUS: Phosphorus: 3.7 mg/dL (ref 2.5–4.6)

## 2020-04-21 NOTE — Progress Notes (Signed)
Foley removed per protocol, Patient tolerated procedure well, will continue to assess.

## 2020-04-21 NOTE — Progress Notes (Signed)
HD STarted

## 2020-04-21 NOTE — ED Notes (Signed)
Pt taken to dialysis 

## 2020-04-21 NOTE — Progress Notes (Signed)
Established hemodialysis patient known at [facility] [address]) TTS 11:45. Please contact me with any dialysis placement concerns.   Elvera Bicker Dialysis Coordinator [phone]

## 2020-04-21 NOTE — Progress Notes (Signed)
HD restarted, machine issues resolved.  Restart at 2hrs, seq 2 liters, MD notified

## 2020-04-21 NOTE — Progress Notes (Signed)
Machine continued to read air detector, assess fine, lines restrung x2, machine changed out.

## 2020-04-21 NOTE — Progress Notes (Signed)
[facility]  ROUNDING NOTE   Subjective:  Patient seen and evaluated bedside. Reports that her shortness of breath significantly improved now. Hyperkalemia improved as potassium down to 4.8.   Objective:  Vital signs in last 24 hours:  Temp:  [99.3 F (37.4 C)-100.4 F (38 C)] 99.3 F (37.4 C) (06/01 2330) Pulse Rate:  [62-121] 64 (06/02 1230) Resp:  [11-44] 16 (06/02 1230) BP: (116-158)/(35-69) 123/38 (06/02 1230) SpO2:  [84 %-100 %] 100 % (06/02 1230)  Weight change:  Filed Weights   04/20/20 0018  Weight: 56.8 kg    Intake/Output: I/O last 3 completed shifts: In: 3 [I.V.:3] Out: 1000 [Other:1000]   Intake/Output this shift:  Total I/O In: -  Out: 700 [Urine:700]  Physical Exam: General: No acute distress  Head: Normocephalic, atraumatic. Moist oral mucosal membranes  Eyes: Anicteric  Neck: Supple, trachea midline  Lungs:  Clear to auscultation, normal effort  Heart: S1S2 no rubs, 2/6 systolic ejection murmur  Abdomen:  Soft, nontender, bowel sounds present  Extremities: Trace peripheral edema.  Neurologic: Awake, alert, following commands  Skin: No lesions  Access: Left upper extremity AV fistula    Basic Metabolic Panel: Recent Labs  Lab 04/19/20 2338 04/20/20 0936 04/21/20 0417  NA 134* 137 140  K 6.0* 4.7 4.8  CL 98 95* 95*  CO2 23 29 31   GLUCOSE 189* 148* 110*  BUN 60* 21 36*  CREATININE 6.88* 3.26* 4.78*  CALCIUM 8.6* 8.7* 9.0  PHOS  --   --  3.7    Liver Function Tests: Recent Labs  Lab 04/19/20 2338  AST 18  ALT 9  ALKPHOS 115  BILITOT 0.7  PROT 6.7  ALBUMIN 4.0   No results for input(s): LIPASE, AMYLASE in the last 168 hours. No results for input(s): AMMONIA in the last 168 hours.  CBC: Recent Labs  Lab 04/19/20 2338  WBC 8.0  NEUTROABS 5.5  HGB 8.8*  HCT 27.5*  MCV 92.6  PLT 234    Cardiac Enzymes: No results for input(s): CKTOTAL, CKMB, CKMBINDEX, TROPONINI in the last 168 hours.  BNP: Invalid  input(s): POCBNP  CBG: Recent Labs  Lab 04/20/20 0859 04/20/20 1202 04/20/20 1656 04/21/20 0827  GLUCAP 146* 136* 193* 91    Microbiology: Results for orders placed or performed during the hospital encounter of 04/19/20  SARS Coronavirus 2 by RT PCR (hospital order, performed in [facility] hospital lab) Nasopharyngeal Nasopharyngeal Swab     Status: None   Collection Time: 04/20/20 12:00 AM   Specimen: Nasopharyngeal Swab  Result Value Ref Range Status   SARS Coronavirus 2 NEGATIVE NEGATIVE Final    Comment: (NOTE) SARS-CoV-2 target nucleic acids are NOT DETECTED. The SARS-CoV-2 RNA is generally detectable in upper and lower respiratory specimens during the acute phase of infection. The lowest concentration of SARS-CoV-2 viral copies this assay can detect is 250 copies / mL. A negative result does not preclude SARS-CoV-2 infection and should not be used as the sole basis for treatment or other patient management decisions.  A negative result may occur with improper specimen collection / handling, submission of specimen other than nasopharyngeal swab, presence of viral mutation(s) within the areas targeted by this assay, and inadequate number of viral copies (<250 copies / mL). A negative result must be combined with clinical observations, patient history, and epidemiological information. Fact Sheet for Patients:   StrictlyIdeas.no Fact Sheet for Healthcare Providers: BankingDealers.co.za This test is not yet approved or cleared  by the [place]  States FDA and has been authorized for detection and/or diagnosis of SARS-CoV-2 by FDA under an Emergency Use Authorization (EUA).  This EUA will remain in effect (meaning this test can be used) for the duration of the COVID-19 declaration under Section 564(b)(1) of the Act, 21 U.S.C. section 360bbb-3(b)(1), unless the authorization is terminated or revoked sooner. Performed at [facility], [address] [place], [place] [address]     Coagulation Studies: No results for input(s): LABPROT, INR in the last 72 hours.  Urinalysis: No results for input(s): COLORURINE, LABSPEC, PHURINE, GLUCOSEU, HGBUR, BILIRUBINUR, KETONESUR, PROTEINUR, UROBILINOGEN, NITRITE, LEUKOCYTESUR in the last 72 hours.  Invalid input(s): APPERANCEUR    Imaging: DG Chest Portable 1 View  Result Date: 04/19/2020 CLINICAL DATA:  Shortness of breath EXAM: PORTABLE CHEST 1 VIEW COMPARISON:  Apr 01, 2020 FINDINGS: Again noted is mild cardiomegaly. Aortic knob calcifications. Multifocal hazy airspace opacity and diffuse increased interstitial opacity seen throughout both lungs, this does not appear to be significantly changed since the prior exam. No acute osseous findings. IMPRESSION: Stable multifocal patchy interstitial opacities throughout both lungs which may be due to chronic lung changes with superimposed infectious etiology and/or edema. Electronically Signed   By: Prudencio Pair M.D.   On: 04/19/2020 23:56   ECHOCARDIOGRAM COMPLETE  Result Date: 04/20/2020    ECHOCARDIOGRAM REPORT   Patient Name:   Phyllis [name] Date of Exam: 04/20/2020 Medical Rec #:  [id number]           Height:       58.0 in Accession #:    [id number]          Weight:       125.2 lb Date of Birth:  26-Mar-1946            BSA:          1.492 m Patient Age:    74 years            BP:           154/51 mmHg Patient Gender: F                   HR:           78 bpm. Exam Location:  [facility] Procedure: 2D Echo, Color Doppler and Cardiac Doppler Indications:     I50.31 CHF-Acute Diastolic  History:         Patient has prior history of Echocardiogram examinations, most                  recent 09/21/2019. HFpEF, ESRD; Risk Factors:Sleep Apnea,                  Dyslipidemia and Diabetes. Pt tested positive for COVID-19 on                  12/08/19.  Sonographer:     Charmayne Sheer RDCS (AE) Referring Phys:  [id number] Athena Masse Diagnosing Phys:  [name] MD  Sonographer Comments: No subcostal window and suboptimal parasternal window. IMPRESSIONS  1. Left ventricular ejection fraction, by estimation, is 60 to 65%. The left ventricle has normal function. The left ventricle has no regional wall motion abnormalities. Left ventricular diastolic parameters are consistent with Grade I diastolic dysfunction (impaired relaxation). Elevated left atrial pressure.  2. Right ventricular systolic function is normal. The right ventricular size is normal. Tricuspid regurgitation signal is inadequate for assessing PA pressure.  3. Left atrial size was mildly dilated.  4. Moderately elevated transmitral gradient is noted, most likely due to bulky annular calcification. The mitral valve was not well visualized. Trivial mitral valve regurgitation.  5. The aortic valve has an indeterminant number of cusps. Aortic valve regurgitation is trivial. Moderate to severe aortic valve stenosis. Aortic valve mean gradient measures 23.0 mmHg. Aortic valve Vmax measures 3.33 m/s. FINDINGS  Left Ventricle: Left ventricular ejection fraction, by estimation, is 60 to 65%. The left ventricle has normal function. The left ventricle has no regional wall motion abnormalities. The left ventricular internal cavity size was normal in size. There is  no left ventricular hypertrophy. Left ventricular diastolic parameters are consistent with Grade I diastolic dysfunction (impaired relaxation). Elevated left atrial pressure. Right Ventricle: The right ventricular size is normal. No increase in right ventricular wall thickness. Right ventricular systolic function is normal. Tricuspid regurgitation signal is inadequate for assessing PA pressure. Left Atrium: Left atrial size was mildly dilated. Right Atrium: Right atrial size was normal in size. Pericardium: The pericardium was not well visualized. Mitral Valve: Moderately elevated transmitral gradient is noted, most likely due to bulky annular  calcification. The mitral valve was not well visualized. Moderate mitral annular calcification. Trivial mitral valve regurgitation. MV peak gradient, 10.1 mmHg. The mean mitral valve gradient is 7.0 mmHg. Tricuspid Valve: The tricuspid valve is not well visualized. Tricuspid valve regurgitation is mild. Aortic Valve: The aortic valve has an indeterminant number of cusps. . There is severe thickening and severe calcifcation of the aortic valve. Aortic valve regurgitation is trivial. Moderate to severe aortic stenosis is present. There is severe thickening of the aortic valve. There is severe calcifcation of the aortic valve. Aortic valve mean gradient measures 23.0 mmHg. Aortic valve peak gradient measures 44.4 mmHg. Aortic valve area, by VTI measures 0.78 cm. Pulmonic Valve: The pulmonic valve was not well visualized. Pulmonic valve regurgitation is not visualized. No evidence of pulmonic stenosis. Aorta: The aortic root is normal in size and structure. Pulmonary Artery: The pulmonary artery is not well seen. Venous: The inferior vena cava was not well visualized. IAS/Shunts: The interatrial septum was not well visualized.  LEFT VENTRICLE PLAX 2D LVIDd:         4.23 cm  Diastology LVIDs:         2.31 cm  LV e' lateral:   5.87 cm/s LV PW:         0.94 cm  LV E/e' lateral: 23.7 LV IVS:        0.61 cm  LV e' medial:    4.57 cm/s LVOT diam:     1.80 cm  LV E/e' medial:  30.4 LV SV:         59 LV SV Index:   40 LVOT Area:     2.54 cm  RIGHT VENTRICLE RV Basal diam:  2.58 cm LEFT ATRIUM             Index       RIGHT ATRIUM          Index LA diam:        3.50 cm 2.35 cm/m  RA Area:     8.07 cm LA Vol (A2C):   41.7 ml 27.94 ml/m RA Volume:   13.00 ml 8.71 ml/m LA Vol (A4C):   61.6 ml 41.27 ml/m LA Biplane Vol: 51.4 ml 34.44 ml/m  AORTIC VALVE                    PULMONIC VALVE AV Area (  Vmax):    0.79 cm     PV Vmax:       1.35 m/s AV Area (Vmean):   0.79 cm     PV Vmean:      78.000 cm/s AV Area (VTI):     0.78 cm      PV VTI:        0.240 m AV Vmax:           333.00 cm/s  PV Peak grad:  7.3 mmHg AV Vmean:          215.000 cm/s PV Mean grad:  3.0 mmHg AV VTI:            0.760 m AV Peak Grad:      44.4 mmHg AV Mean Grad:      23.0 mmHg LVOT Vmax:         103.00 cm/s LVOT Vmean:        67.000 cm/s LVOT VTI:          0.232 m LVOT/AV VTI ratio: 0.31  AORTA Ao Root diam: 2.80 cm MITRAL VALVE MV Area (PHT): 3.72 cm     SHUNTS MV Peak grad:  10.1 mmHg    Systemic VTI:  0.23 m MV Mean grad:  7.0 mmHg     Systemic Diam: 1.80 cm MV Vmax:       1.59 m/s MV Vmean:      122.0 cm/s MV Decel Time: 204 msec MV E velocity: 139.00 cm/s MV A velocity: 154.00 cm/s MV E/A ratio:  0.90 Harrell Gave End MD Electronically signed by [name] MD Signature Date/Time: 04/20/2020/5:42:36 PM    Final      Medications:   . sodium chloride    . sodium chloride    . sodium chloride     . amLODipine  10 mg Oral Daily  . aspirin EC  81 mg Oral Daily  . calcium acetate  1,334 mg Oral TID WC  . carvedilol  12.5 mg Oral BID WC  . Chlorhexidine Gluconate Cloth  6 each Topical Q0600  . clopidogrel  75 mg Oral Daily  . furosemide  60 mg Intravenous BID  . heparin  5,000 Units Subcutaneous Q8H  . hydrALAZINE  50 mg Oral Q8H  . insulin aspart  0-15 Units Subcutaneous TID WC  . iron polysaccharides  150 mg Oral Daily  . lidocaine  1 patch Transdermal Q24H  . melatonin  5 mg Oral QHS  . mirtazapine  15 mg Oral QHS  . oxybutynin  15 mg Oral Daily  . pravastatin  40 mg Oral QHS  . sertraline  50 mg Oral QHS  . sodium chloride flush  3 mL Intravenous Q12H  . sodium zirconium cyclosilicate  10 g Oral Once   sodium chloride, sodium chloride, sodium chloride, acetaminophen, albuterol, alteplase, fluticasone, heparin, HYDROcodone-acetaminophen, ipratropium-albuterol, lidocaine (PF), lidocaine-prilocaine, lidocaine-prilocaine, ondansetron (ZOFRAN) IV, pentafluoroprop-tetrafluoroeth, sodium chloride flush, topiramate  Assessment/ Plan:  74  y.o. female  with past medical history of ESRD on HD TTS, aortic stenosis, hypertension, COPD, diabetes mellitus type 2, overactive bladder, gout, GERD, chronic costochondritis admitted with Hyperkalemia [E87.5] Dyspnea [R06.00] Flash pulmonary edema (HCC) [J81.0] Acute respiratory failure with hypoxia ([place]) [J96.01]   [facility]/TTS/[facility] [address]/left upper extremity AV fistula  1.  ESRD on HD TTS.  We are performing an extra dialysis treatment today as the patient came in with acute pulmonary edema.  We will likely need to perform an extra ultrafiltration session every other week to  help keep her out of pulmonary edema given her known aortic stenosis.  2.  Anemia of chronic kidney disease.  Hemoglobin currently 8.8.  Resume Epogen as an outpatient.  3.  Secondary hyperparathyroidism.  Phosphorus currently 3.7 and acceptable.  Maintain the patient on calcium acetate 2 tablets p.o. 3 times daily with meals.  4.  Hyperkalemia.  Given her aortic stenosis we will avoid sodium zirconium given the additional sodium load.   LOS: 1 Merita Hawks 6/2/202112:46 PM

## 2020-04-21 NOTE — Progress Notes (Signed)
Pre HD  

## 2020-04-21 NOTE — Progress Notes (Signed)
PROGRESS NOTE    Phyllis [name]  FYB:[id number] DOB: 1946/05/19 DOA: 04/19/2020 PCP: [name], MD    Assessment & Plan:   Principal Problem:   Flash pulmonary edema ([place]) Active Problems:   ESRD (end stage renal disease) on dialysis ([place])   Type 2 diabetes mellitus with other specified complication (HCC)   Acute on chronic diastolic CHF (congestive heart failure) (HCC)   Hyperkalemia   Acute respiratory failure with hypoxemia (HCC)   Severe aortic valve stenosis   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-stage renal disease) (HCC)   CAD (coronary artery disease)   Dyspnea   Acute hypoxic respiratory failure: likely secondary to acute pulmonary edema. Continue on HD. Continue on supplemental oxygen   Flash pulmonary edema: in the setting of history of severe aortic stenosis acute on chronic diastolic CHF & ESRD. Secondary to fluid overload though patient has been adherent to the dialysis. Continue home carvedilol, amlodipine  Hyperkalemia: resolved  ESRD: on HD TTS. Not missed any sessions. Nephro following and recs apprec  Anemia of chronic disease: likely secondary to ESRD. Will continue to monitor   DM2: continue on SSI w/ accuchecks   CAD: no complaints of chest pain.Continue home clopidogrel, aspirin, pravastatin, carvedilol   DVT prophylaxis:  Heparin SQ Code Status: full  Family Communication:  Disposition Plan: depends on PT/OT recs; unlikely any barriers    Consultants:   nephro   Procedures:   Antimicrobials:    Subjective: Pt c/o fatigue and shortness of breath   Objective: Vitals:   04/21/20 0600 04/21/20 0630 04/21/20 0631 04/21/20 0827  BP: (!) 136/42 (!) 128/41 (!) 128/41 (!) 125/43  Pulse: 65 65  70  Resp: 17 (!) 22  16  Temp:      TempSrc:      SpO2: 100% 100%  100%  Weight:      Height:        Intake/Output Summary (Last 24 hours) at 04/21/2020 0902 Last data filed at 04/20/2020 1216 Gross per 24 hour  Intake 3 ml  Output --  Net 3  ml   Filed Weights   04/20/20 0018  Weight: 56.8 kg    Examination:  General exam: Appears calm and comfortable  Respiratory system: diminished breath sounds b/l Cardiovascular system: S1 & S2+. No rubs, gallops or clicks.. Gastrointestinal system: Abdomen is nondistended, soft and nontender. Normal bowel sounds heard. Central nervous system: Alert and oriented. Moves all 4 extremities  Psychiatry: Judgement and insight appear normal. Flat mood and affect     Data Reviewed: I have personally reviewed following labs and imaging studies  CBC: Recent Labs  Lab 04/19/20 2338  WBC 8.0  NEUTROABS 5.5  HGB 8.8*  HCT 27.5*  MCV 92.6  PLT 527   Basic Metabolic Panel: Recent Labs  Lab 04/19/20 2338 04/20/20 0936 04/21/20 0417  NA 134* 137 140  K 6.0* 4.7 4.8  CL 98 95* 95*  CO2 23 29 31   GLUCOSE 189* 148* 110*  BUN 60* 21 36*  CREATININE 6.88* 3.26* 4.78*  CALCIUM 8.6* 8.7* 9.0  PHOS  --   --  3.7   GFR: Estimated Creatinine Clearance: 7.8 mL/min (A) (by C-G formula based on SCr of 4.78 mg/dL (H)). Liver Function Tests: Recent Labs  Lab 04/19/20 2338  AST 18  ALT 9  ALKPHOS 115  BILITOT 0.7  PROT 6.7  ALBUMIN 4.0   No results for input(s): LIPASE, AMYLASE in the last 168 hours. No results for input(s): AMMONIA in  the last 168 hours. Coagulation Profile: No results for input(s): INR, PROTIME in the last 168 hours. Cardiac Enzymes: No results for input(s): CKTOTAL, CKMB, CKMBINDEX, TROPONINI in the last 168 hours. BNP (last 3 results) No results for input(s): PROBNP in the last 8760 hours. HbA1C: No results for input(s): HGBA1C in the last 72 hours. CBG: Recent Labs  Lab 04/20/20 0859 04/20/20 1202 04/20/20 1656 04/21/20 0827  GLUCAP 146* 136* 193* 91   Lipid Profile: No results for input(s): CHOL, HDL, LDLCALC, TRIG, CHOLHDL, LDLDIRECT in the last 72 hours. Thyroid Function Tests: No results for input(s): TSH, T4TOTAL, FREET4, T3FREE, THYROIDAB  in the last 72 hours. Anemia Panel: No results for input(s): VITAMINB12, FOLATE, FERRITIN, TIBC, IRON, RETICCTPCT in the last 72 hours. Sepsis Labs: Recent Labs  Lab 04/19/20 2338  PROCALCITON 0.17  LATICACIDVEN 1.6    Recent Results (from the past 240 hour(s))  SARS Coronavirus 2 by RT PCR (hospital order, performed in [name] hospital lab) Nasopharyngeal Nasopharyngeal Swab     Status: None   Collection Time: 04/20/20 12:00 AM   Specimen: Nasopharyngeal Swab  Result Value Ref Range Status   SARS Coronavirus 2 NEGATIVE NEGATIVE Final    Comment: (NOTE) SARS-CoV-2 target nucleic acids are NOT DETECTED. The SARS-CoV-2 RNA is generally detectable in upper and lower respiratory specimens during the acute phase of infection. The lowest concentration of SARS-CoV-2 viral copies this assay can detect is 250 copies / mL. A negative result does not preclude SARS-CoV-2 infection and should not be used as the sole basis for treatment or other patient management decisions.  A negative result may occur with improper specimen collection / handling, submission of specimen other than nasopharyngeal swab, presence of viral mutation(s) within the areas targeted by this assay, and inadequate number of viral copies (<250 copies / mL). A negative result must be combined with clinical observations, patient history, and epidemiological information. Fact Sheet for Patients:   StrictlyIdeas.no Fact Sheet for Healthcare Providers: BankingDealers.co.za This test is not yet approved or cleared  by the [place] FDA and has been authorized for detection and/or diagnosis of SARS-CoV-2 by FDA under an Emergency Use Authorization (EUA).  This EUA will remain in effect (meaning this test can be used) for the duration of the COVID-19 declaration under Section 564(b)(1) of the Act, 21 U.S.C. section 360bbb-3(b)(1), unless the authorization is terminated  or revoked sooner. Performed at [facility], [address] [place], [place] [address]          Radiology Studies: DG Chest Portable 1 View  Result Date: 04/19/2020 CLINICAL DATA:  Shortness of breath EXAM: PORTABLE CHEST 1 VIEW COMPARISON:  Apr 01, 2020 FINDINGS: Again noted is mild cardiomegaly. Aortic knob calcifications. Multifocal hazy airspace opacity and diffuse increased interstitial opacity seen throughout both lungs, this does not appear to be significantly changed since the prior exam. No acute osseous findings. IMPRESSION: Stable multifocal patchy interstitial opacities throughout both lungs which may be due to chronic lung changes with superimposed infectious etiology and/or edema. Electronically Signed   By: Prudencio Pair M.D.   On: 04/19/2020 23:56   ECHOCARDIOGRAM COMPLETE  Result Date: 04/20/2020    ECHOCARDIOGRAM REPORT   Patient Name:   Phyllis [name] Pollett Date of Exam: 04/20/2020 Medical Rec #:  [id number]           Height:       58.0 in Accession #:    [id number]  Weight:       125.2 lb Date of Birth:  26-Mar-1946            BSA:          1.492 m Patient Age:    44 years            BP:           154/51 mmHg Patient Gender: F                   HR:           78 bpm. Exam Location:  [facility] Procedure: 2D Echo, Color Doppler and Cardiac Doppler Indications:     I50.31 CHF-Acute Diastolic  History:         Patient has prior history of Echocardiogram examinations, most                  recent 09/21/2019. HFpEF, ESRD; Risk Factors:Sleep Apnea,                  Dyslipidemia and Diabetes. Pt tested positive for COVID-19 on                  12/08/19.  Sonographer:     Charmayne Sheer RDCS (AE) Referring Phys:  [id number] Athena Masse Diagnosing Phys: [name] MD  Sonographer Comments: No subcostal window and suboptimal parasternal window. IMPRESSIONS  1. Left ventricular ejection fraction, by estimation, is 60 to 65%. The left ventricle has normal function. The left ventricle  has no regional wall motion abnormalities. Left ventricular diastolic parameters are consistent with Grade I diastolic dysfunction (impaired relaxation). Elevated left atrial pressure.  2. Right ventricular systolic function is normal. The right ventricular size is normal. Tricuspid regurgitation signal is inadequate for assessing PA pressure.  3. Left atrial size was mildly dilated.  4. Moderately elevated transmitral gradient is noted, most likely due to bulky annular calcification. The mitral valve was not well visualized. Trivial mitral valve regurgitation.  5. The aortic valve has an indeterminant number of cusps. Aortic valve regurgitation is trivial. Moderate to severe aortic valve stenosis. Aortic valve mean gradient measures 23.0 mmHg. Aortic valve Vmax measures 3.33 m/s. FINDINGS  Left Ventricle: Left ventricular ejection fraction, by estimation, is 60 to 65%. The left ventricle has normal function. The left ventricle has no regional wall motion abnormalities. The left ventricular internal cavity size was normal in size. There is  no left ventricular hypertrophy. Left ventricular diastolic parameters are consistent with Grade I diastolic dysfunction (impaired relaxation). Elevated left atrial pressure. Right Ventricle: The right ventricular size is normal. No increase in right ventricular wall thickness. Right ventricular systolic function is normal. Tricuspid regurgitation signal is inadequate for assessing PA pressure. Left Atrium: Left atrial size was mildly dilated. Right Atrium: Right atrial size was normal in size. Pericardium: The pericardium was not well visualized. Mitral Valve: Moderately elevated transmitral gradient is noted, most likely due to bulky annular calcification. The mitral valve was not well visualized. Moderate mitral annular calcification. Trivial mitral valve regurgitation. MV peak gradient, 10.1 mmHg. The mean mitral valve gradient is 7.0 mmHg. Tricuspid Valve: The tricuspid  valve is not well visualized. Tricuspid valve regurgitation is mild. Aortic Valve: The aortic valve has an indeterminant number of cusps. . There is severe thickening and severe calcifcation of the aortic valve. Aortic valve regurgitation is trivial. Moderate to severe aortic stenosis is present. There is severe thickening of the aortic valve. There is severe calcifcation  of the aortic valve. Aortic valve mean gradient measures 23.0 mmHg. Aortic valve peak gradient measures 44.4 mmHg. Aortic valve area, by VTI measures 0.78 cm. Pulmonic Valve: The pulmonic valve was not well visualized. Pulmonic valve regurgitation is not visualized. No evidence of pulmonic stenosis. Aorta: The aortic root is normal in size and structure. Pulmonary Artery: The pulmonary artery is not well seen. Venous: The inferior vena cava was not well visualized. IAS/Shunts: The interatrial septum was not well visualized.  LEFT VENTRICLE PLAX 2D LVIDd:         4.23 cm  Diastology LVIDs:         2.31 cm  LV e' lateral:   5.87 cm/s LV PW:         0.94 cm  LV E/e' lateral: 23.7 LV IVS:        0.61 cm  LV e' medial:    4.57 cm/s LVOT diam:     1.80 cm  LV E/e' medial:  30.4 LV SV:         59 LV SV Index:   40 LVOT Area:     2.54 cm  RIGHT VENTRICLE RV Basal diam:  2.58 cm LEFT ATRIUM             Index       RIGHT ATRIUM          Index LA diam:        3.50 cm 2.35 cm/m  RA Area:     8.07 cm LA Vol (A2C):   41.7 ml 27.94 ml/m RA Volume:   13.00 ml 8.71 ml/m LA Vol (A4C):   61.6 ml 41.27 ml/m LA Biplane Vol: 51.4 ml 34.44 ml/m  AORTIC VALVE                    PULMONIC VALVE AV Area (Vmax):    0.79 cm     PV Vmax:       1.35 m/s AV Area (Vmean):   0.79 cm     PV Vmean:      78.000 cm/s AV Area (VTI):     0.78 cm     PV VTI:        0.240 m AV Vmax:           333.00 cm/s  PV Peak grad:  7.3 mmHg AV Vmean:          215.000 cm/s PV Mean grad:  3.0 mmHg AV VTI:            0.760 m AV Peak Grad:      44.4 mmHg AV Mean Grad:      23.0 mmHg LVOT Vmax:          103.00 cm/s LVOT Vmean:        67.000 cm/s LVOT VTI:          0.232 m LVOT/AV VTI ratio: 0.31  AORTA Ao Root diam: 2.80 cm MITRAL VALVE MV Area (PHT): 3.72 cm     SHUNTS MV Peak grad:  10.1 mmHg    Systemic VTI:  0.23 m MV Mean grad:  7.0 mmHg     Systemic Diam: 1.80 cm MV Vmax:       1.59 m/s MV Vmean:      122.0 cm/s MV Decel Time: 204 msec MV E velocity: 139.00 cm/s MV A velocity: 154.00 cm/s MV E/A ratio:  0.90 Harrell Gave End MD Electronically signed by [name] MD Signature Date/Time: 04/20/2020/5:42:36 PM  Final         Scheduled Meds: . amLODipine  10 mg Oral Daily  . aspirin EC  81 mg Oral Daily  . calcium acetate  1,334 mg Oral TID WC  . carvedilol  12.5 mg Oral BID WC  . Chlorhexidine Gluconate Cloth  6 each Topical Q0600  . clopidogrel  75 mg Oral Daily  . furosemide  60 mg Intravenous BID  . heparin  5,000 Units Subcutaneous Q8H  . hydrALAZINE  50 mg Oral Q8H  . insulin aspart  0-15 Units Subcutaneous TID WC  . iron polysaccharides  150 mg Oral Daily  . lidocaine  1 patch Transdermal Q24H  . melatonin  5 mg Oral QHS  . mirtazapine  15 mg Oral QHS  . oxybutynin  15 mg Oral Daily  . pravastatin  40 mg Oral QHS  . sertraline  50 mg Oral QHS  . sodium chloride flush  3 mL Intravenous Q12H  . sodium zirconium cyclosilicate  10 g Oral Once   Continuous Infusions: . sodium chloride    . sodium chloride    . sodium chloride       LOS: 1 day    Time spent: 32 mins    Wyvonnia Dusky, MD [facility] Pager [phone]  If 7PM-7AM, please contact night-coverage [email or web address] 04/21/2020, 9:02 AM

## 2020-04-21 NOTE — Progress Notes (Signed)
Post hd 

## 2020-04-21 NOTE — Progress Notes (Signed)
POSt HD, Pt stable.

## 2020-04-21 NOTE — ED Notes (Signed)
Pt eating breakfast. Personal caregiver at bedside

## 2020-04-21 NOTE — ED Notes (Signed)
Pt visitor provided with sandwich tray

## 2020-04-21 NOTE — ED Notes (Signed)
See notes from paper chart during computer downtime.

## 2020-04-21 NOTE — Progress Notes (Signed)
PRE HD   

## 2020-04-21 NOTE — ED Notes (Signed)
Pt resting at present  Sitter at bedside

## 2020-04-21 NOTE — ED Notes (Signed)
Pt provided with warm blanket, visitor asks for pt BP to be taken off of arm at this time.

## 2020-04-21 NOTE — Progress Notes (Signed)
HD ended 

## 2020-04-22 DIAGNOSIS — D631 Anemia in chronic kidney disease: Secondary | ICD-10-CM

## 2020-04-22 LAB — PARATHYROID HORMONE, INTACT (NO CA): PTH: 149 pg/mL — ABNORMAL HIGH (ref 15–65)

## 2020-04-22 LAB — BASIC METABOLIC PANEL
Anion gap: 11 (ref 5–15)
BUN: 45 mg/dL — ABNORMAL HIGH (ref 8–23)
CO2: 29 mmol/L (ref 22–32)
Calcium: 8.4 mg/dL — ABNORMAL LOW (ref 8.9–10.3)
Chloride: 96 mmol/L — ABNORMAL LOW (ref 98–111)
Creatinine, Ser: 6.17 mg/dL — ABNORMAL HIGH (ref 0.44–1.00)
GFR calc Af Amer: 7 mL/min — ABNORMAL LOW (ref >60)
GFR calc non Af Amer: 6 mL/min — ABNORMAL LOW (ref >60)
Glucose, Bld: 78 mg/dL (ref 70–99)
Potassium: 5 mmol/L (ref 3.5–5.1)
Sodium: 136 mmol/L (ref 135–145)

## 2020-04-22 LAB — CBC
HCT: 22.6 % — ABNORMAL LOW (ref 36.0–46.0)
Hemoglobin: 7.2 g/dL — ABNORMAL LOW (ref 12.0–15.0)
MCH: 29.5 pg (ref 26.0–34.0)
MCHC: 31.9 g/dL (ref 30.0–36.0)
MCV: 92.6 fL (ref 80.0–100.0)
Platelets: 143 10*3/uL — ABNORMAL LOW (ref 150–400)
RBC: 2.44 MIL/uL — ABNORMAL LOW (ref 3.87–5.11)
RDW: 14.1 % (ref 11.5–15.5)
WBC: 5.5 10*3/uL (ref 4.0–10.5)
nRBC: 0 % (ref 0.0–0.2)

## 2020-04-22 LAB — GLUCOSE, CAPILLARY: Glucose-Capillary: 78 mg/dL (ref 70–99)

## 2020-04-22 NOTE — Discharge Summary (Addendum)
Physician Discharge Summary  Phyllis [name] NWG:[id number] DOB: 11-17-1946 DOA: 04/19/2020  PCP: [name], MD  Admit date: 04/19/2020 Discharge date: 04/22/2020  Admitted From: home Disposition:  Home w/ home health   Recommendations for Outpatient Follow-up:  1. Follow up with PCP in 1-2 weeks 2. F/u nephro in 1 week  Home Health: yes Equipment/Devices:  Discharge Condition: stable CODE STATUS: full  Diet recommendation: Heart Healthy /Renal   Brief/Interim Summary: HPI was taken from Dr. Damita Dunnings: Phyllis [name] is a 74 y.o. female with medical history significant for end-stage renal disease on hemodialysis on Tuesdays Thursdays and Saturdays, diabetes, COPD, asthma, GERD, , anxiety with depression, diastolic dysfunction CHF and severe aortic stenosis   up-to-date Covid vaccine who presents to the emergency room with sudden onset of severe respiratory distress.  EMS recorded an O2 sats of 42% and she was placed on CPAP.  Systolic blood pressure was in the 190s.  Patient denied having missed dialysis and had her full session on 2 days prior on Saturday. She denies chest pain, cough or fever. Patient was hospitalized for the same 2 weeks prior. ED Course: Upon arrival O2 sat was in the low 90s but with RR of 28 and she was switched over to BiPAP.  Temperature 99, BP 124/46, HR 62.  Chest x-ray showed multifocal patchy interstitial opacities throughout both lungs probably due to chronic lung changes with superimposed infectious etiology and or/edema.  On her blood work she had a potassium of 6 with normal bicarb of 23.  Hemoglobin 8.8 which is her baseline.  Normal WBC.  Troponin 19, BMP 960.  Procalcitonin 0.17.  Patient who still produces urine, was treated with 80 mg Lasix.  Nephrology consult was called.  At the present time Dr. Holley Raring is making arrangements for emergency dialysis.  Hospitalist consulted for admission.  Hospital Course from Dr. Lenise Herald 6/2-04/22/20: Pt  presented w/ acute hypoxic respiratory failure secondary to flash pulmonary edema. Pt did receive HD x2 while inpatient for the pulmonary edema. Pt was able to be weaned off of supplemental oxygen prior to d/c. Of note, nephro will likely do an extra ultrafiltration every other week to help w/ pulmonary edema. Pt has aortic stenosis which likely contributes to the flash pulmonary edema. Pt was also found to have hyperkalemia which resolved w/ HD. Pt will resume home health at d/c as well as her 24 hour aide care.   Discharge Diagnoses:  Principal Problem:   Flash pulmonary edema (HCC) Active Problems:   ESRD (end stage renal disease) on dialysis (HCC)   Type 2 diabetes mellitus with other specified complication (HCC)   Acute on chronic diastolic CHF (congestive heart failure) (HCC)   Hyperkalemia   Acute respiratory failure with hypoxemia (HCC)   Severe aortic valve stenosis   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-stage renal disease) (HCC)   CAD (coronary artery disease)   Dyspnea  Acute hypoxic respiratory failure: likely secondary to acute pulmonary edema. Continue on HD. Weaned off of supplemental oxygen. Resolved   Flash pulmonary edema: in the setting of history of severe aortic stenosis acute on chronic diastolic CHF & ESRD. Secondary to fluid overload though patient has been adherent to the dialysis. S/p HD 04/21/20 & 04/22/20.  May need to have extra ultrafiltration every other week to help prevent pulmonary edema as per nephro. Continue home carvedilol, amlodipine  Hyperkalemia: resolved  ESRD: on HD TTS but s/p HD 04/21/20. Not missed any sessions. Nephro following and recs apprec  Anemia of chronic disease: likely secondary to ESRD. Continue on epo as an outpatient. Will continue to monitor   DM2: continue on SSI w/ accuchecks   CAD: no complaints of chest pain.Continue home clopidogrel, aspirin, pravastatin, carvedilol  Hx of aortic stenosis: continue on carvedilol. Likely contributes to  pulmonary edema   Thrombocytopenia: etiology unclear. Mild. Will continue to monitor   Discharge Instructions  Discharge Instructions    Diet - low sodium heart healthy   Complete by: As directed    And a renal diet   Discharge instructions   Complete by: As directed    F/u PCP in 1- 2 weeks; F/u nephro in 1 week   Increase activity slowly   Complete by: As directed      Allergies as of 04/22/2020      Reactions   Enalapril Maleate Other (See Comments)   Other reaction(s): Headache   Nitrofurantoin Swelling, Rash   Other Reaction: swelling of body   Sulfamethoxazole-trimethoprim Swelling   2,4-d Dimethylamine (amisol) Rash, Other (See Comments)   Other Reaction: h/a   Baclofen Other (See Comments), Nausea Only   lightheadness ,drowsiness , muscle weakness , twitching in hands    Neosporin [neomycin-bacitracin Zn-polymyx] Other (See Comments), Rash   Other Reaction: irritation Skin irritation   Quinine Nausea And Vomiting, Rash, Other (See Comments)   Other Reaction: Vomiting, rash, h/a, vision   Ultram [tramadol] Palpitations   Zocor [simvastatin] Other (See Comments), Rash   Other Reaction: muscle spasms Muscle pain and spasms   Bactrim [sulfamethoxazole-trimethoprim] Swelling   Levodopa Other (See Comments)   Reaction: unknown   Macrodantin [nitrofurantoin Macrocrystal] Swelling   Quinine Derivatives Other (See Comments)   Vertigo,nausea vomiting blurred vision headache ears sensitivity      Medication List    TAKE these medications   acetaminophen 500 MG tablet Commonly known as: TYLENOL Take 1 tablet (500 mg total) by mouth every 6 (six) hours as needed for mild pain or fever.   albuterol 108 (90 Base) MCG/ACT inhaler Commonly known as: VENTOLIN HFA Inhale 2 puffs into the lungs every 6 (six) hours as needed for wheezing or shortness of breath. Out of medication   amLODipine 10 MG tablet Commonly known as: NORVASC Take 10 mg by mouth daily.   Aspirin 81  81 MG EC tablet Generic drug: aspirin Take 1 tablet by mouth daily.   benzonatate 100 MG capsule Commonly known as: TESSALON Take 100 mg by mouth 3 (three) times daily.   calcium acetate 667 MG capsule Commonly known as: PHOSLO Take 2 capsules by mouth in the morning, at noon, and at bedtime.   carvedilol 12.5 MG tablet Commonly known as: COREG Take 12.5 mg by mouth 2 (two) times daily with a meal.   cetirizine 10 MG tablet Commonly known as: ZYRTEC Take 10 mg by mouth daily as needed for allergies.   clopidogrel 75 MG tablet Commonly known as: PLAVIX Take 1 tablet (75 mg total) by mouth daily.   [employer] Melatonin 5 MG Chew Generic drug: Melatonin Chew 1 tablet by mouth at bedtime.   epoetin alfa 10000 UNIT/ML injection Commonly known as: EPOGEN Inject 0.4 mLs (4,000 Units total) into the vein Every Tuesday,Thursday,and Saturday with dialysis.   furosemide 80 MG tablet Commonly known as: Lasix Take 1 tablet (80 mg total) by mouth daily.   hydrALAZINE 50 MG tablet Commonly known as: APRESOLINE TAKE 1 TABLET BY MOUTH EVERY 8 HOURS What changed: when to take this   HYDROcodone-acetaminophen  7.5-325 MG tablet Commonly known as: NORCO Take 1 tablet by mouth every 6 (six) hours as needed (pain). What changed:   how much to take  when to take this   ipratropium-albuterol 0.5-2.5 (3) MG/3ML Soln Commonly known as: DUONEB Take 3 mLs by nebulization every 6 (six) hours as needed.   iron polysaccharides 150 MG capsule Commonly known as: NIFEREX Take 150 mg by mouth daily.   lidocaine 5 % Commonly known as: LIDODERM Place 1 patch onto the skin daily. Remove & Discard patch within 12 hours or as directed by MD   lidocaine-prilocaine cream Commonly known as: EMLA Apply 1 application topically as needed (prior to treatment).   losartan 50 MG tablet Commonly known as: COZAAR Take 50 mg by mouth daily.   mirtazapine 15 MG tablet Commonly known as: REMERON Take 15 mg  by mouth at bedtime.   mometasone 50 MCG/ACT nasal spray Commonly known as: NASONEX Place 2 sprays into the nose daily as needed (allergies).   oxybutynin 15 MG 24 hr tablet Commonly known as: DITROPAN XL Take 15 mg by mouth daily.   pravastatin 40 MG tablet Commonly known as: PRAVACHOL Take 40 mg by mouth at bedtime.   sertraline 50 MG tablet Commonly known as: ZOLOFT Take 50 mg by mouth at bedtime.   topiramate 25 MG tablet Commonly known as: TOPAMAX Take 1 tablet (25 mg total) by mouth as needed (for headaches).      Follow-up Information    [facility] Follow up on 04/28/2020.   Specialty: Cardiology Why: at 2:00pm. Enter through the [facility] entrance Contact information: [place] [phone]       [name], MD. Schedule an appointment as soon as possible for a visit.   Specialty: Internal Medicine Why: Prior to discharge please make follow up appoinment with in 3-5 days of discharge Contact information: [place] [address] [phone]        Minna Merritts, MD .   Specialty: Cardiology Contact information: [address] [place] [address] [phone]          Allergies  Allergen Reactions  . Enalapril Maleate Other (See Comments)    Other reaction(s): Headache  . Nitrofurantoin Swelling and Rash    Other Reaction: swelling of body  . Sulfamethoxazole-Trimethoprim Swelling  . 2,4-D Dimethylamine (Amisol) Rash and Other (See Comments)    Other Reaction: h/a  . Baclofen Other (See Comments) and Nausea Only    lightheadness ,drowsiness , muscle weakness , twitching in hands   . Neosporin [Neomycin-Bacitracin Zn-Polymyx] Other (See Comments) and Rash    Other Reaction: irritation Skin irritation   . Quinine Nausea And Vomiting, Rash and Other (See Comments)    Other  Reaction: Vomiting, rash, h/a, vision  . Ultram [Tramadol] Palpitations  . Zocor [Simvastatin] Other (See Comments) and Rash    Other Reaction: muscle spasms Muscle pain and spasms  . Bactrim [Sulfamethoxazole-Trimethoprim] Swelling  . Levodopa Other (See Comments)    Reaction: unknown  . Macrodantin [Nitrofurantoin Macrocrystal] Swelling  . Quinine Derivatives Other (See Comments)    Vertigo,nausea vomiting blurred vision headache ears sensitivity     Consultations:  Nephro, Dr. Holley Raring   Procedures/Studies: DG Chest 1 View  Result Date: 04/01/2020 CLINICAL DATA:  Shortness of breath EXAM: CHEST  1 VIEW COMPARISON:  Mar 29, 2020 FINDINGS: The heart size and  mediastinal contours are unchanged with mild cardiomegaly. Aortic knob calcifications. Again noted are multifocal airspace opacities and increased interstitial markings throughout both lungs as on the prior exam. There is slight interval worsening in the right upper lung airspace opacities. Pleural calcifications seen in the right upper lung. IMPRESSION: Multifocal airspace opacities throughout both lungs with slight interval worsening in the right upper lung which could be due to multifocal pneumonia or asymmetric edema with superimposed chronic lung changes. Electronically Signed   By: Prudencio Pair M.D.   On: 04/01/2020 00:20   DG Chest Portable 1 View  Result Date: 04/19/2020 CLINICAL DATA:  Shortness of breath EXAM: PORTABLE CHEST 1 VIEW COMPARISON:  Apr 01, 2020 FINDINGS: Again noted is mild cardiomegaly. Aortic knob calcifications. Multifocal hazy airspace opacity and diffuse increased interstitial opacity seen throughout both lungs, this does not appear to be significantly changed since the prior exam. No acute osseous findings. IMPRESSION: Stable multifocal patchy interstitial opacities throughout both lungs which may be due to chronic lung changes with superimposed infectious etiology and/or edema. Electronically Signed   By:  Prudencio Pair M.D.   On: 04/19/2020 23:56   DG Chest Portable 1 View  Result Date: 03/29/2020 CLINICAL DATA:  Shortness of breath EXAM: PORTABLE CHEST 1 VIEW COMPARISON:  12/04/2019 FINDINGS: Cardiac shadow is mildly enlarged. Aortic calcifications are seen. Bilateral airspace opacities are noted increased from the prior exam likely representing some acute on chronic multifocal pneumonia. Possible mild pulmonary edema could not be totally excluded. No sizable effusion is seen. IMPRESSION: Increased parenchymal opacities bilaterally. This may represent acute on chronic infiltrate or possibly some acute pulmonary edema superimposed over more chronic pattern. Electronically Signed   By: Inez Catalina M.D.   On: 03/29/2020 22:37   ECHOCARDIOGRAM COMPLETE  Result Date: 04/20/2020    ECHOCARDIOGRAM REPORT   Patient Name:   Phyllis [name] Hoke Date of Exam: 04/20/2020 Medical Rec #:  [id number]           Height:       58.0 in Accession #:    [id number]          Weight:       125.2 lb Date of Birth:  09-15-1946            BSA:          1.492 m Patient Age:    39 years            BP:           154/51 mmHg Patient Gender: F                   HR:           78 bpm. Exam Location:  [facility] Procedure: 2D Echo, Color Doppler and Cardiac Doppler Indications:     I50.31 CHF-Acute Diastolic  History:         Patient has prior history of Echocardiogram examinations, most                  recent 09/21/2019. HFpEF, ESRD; Risk Factors:Sleep Apnea,                  Dyslipidemia and Diabetes. Pt tested positive for COVID-19 on                  12/08/19.  Sonographer:     Charmayne Sheer RDCS (AE) Referring Phys:  [id number] Athena Masse Diagnosing Phys: [name] MD  Sonographer Comments: No subcostal  window and suboptimal parasternal window. IMPRESSIONS  1. Left ventricular ejection fraction, by estimation, is 60 to 65%. The left ventricle has normal function. The left ventricle has no regional wall motion abnormalities. Left ventricular  diastolic parameters are consistent with Grade I diastolic dysfunction (impaired relaxation). Elevated left atrial pressure.  2. Right ventricular systolic function is normal. The right ventricular size is normal. Tricuspid regurgitation signal is inadequate for assessing PA pressure.  3. Left atrial size was mildly dilated.  4. Moderately elevated transmitral gradient is noted, most likely due to bulky annular calcification. The mitral valve was not well visualized. Trivial mitral valve regurgitation.  5. The aortic valve has an indeterminant number of cusps. Aortic valve regurgitation is trivial. Moderate to severe aortic valve stenosis. Aortic valve mean gradient measures 23.0 mmHg. Aortic valve Vmax measures 3.33 m/s. FINDINGS  Left Ventricle: Left ventricular ejection fraction, by estimation, is 60 to 65%. The left ventricle has normal function. The left ventricle has no regional wall motion abnormalities. The left ventricular internal cavity size was normal in size. There is  no left ventricular hypertrophy. Left ventricular diastolic parameters are consistent with Grade I diastolic dysfunction (impaired relaxation). Elevated left atrial pressure. Right Ventricle: The right ventricular size is normal. No increase in right ventricular wall thickness. Right ventricular systolic function is normal. Tricuspid regurgitation signal is inadequate for assessing PA pressure. Left Atrium: Left atrial size was mildly dilated. Right Atrium: Right atrial size was normal in size. Pericardium: The pericardium was not well visualized. Mitral Valve: Moderately elevated transmitral gradient is noted, most likely due to bulky annular calcification. The mitral valve was not well visualized. Moderate mitral annular calcification. Trivial mitral valve regurgitation. MV peak gradient, 10.1 mmHg. The mean mitral valve gradient is 7.0 mmHg. Tricuspid Valve: The tricuspid valve is not well visualized. Tricuspid valve regurgitation is  mild. Aortic Valve: The aortic valve has an indeterminant number of cusps. . There is severe thickening and severe calcifcation of the aortic valve. Aortic valve regurgitation is trivial. Moderate to severe aortic stenosis is present. There is severe thickening of the aortic valve. There is severe calcifcation of the aortic valve. Aortic valve mean gradient measures 23.0 mmHg. Aortic valve peak gradient measures 44.4 mmHg. Aortic valve area, by VTI measures 0.78 cm. Pulmonic Valve: The pulmonic valve was not well visualized. Pulmonic valve regurgitation is not visualized. No evidence of pulmonic stenosis. Aorta: The aortic root is normal in size and structure. Pulmonary Artery: The pulmonary artery is not well seen. Venous: The inferior vena cava was not well visualized. IAS/Shunts: The interatrial septum was not well visualized.  LEFT VENTRICLE PLAX 2D LVIDd:         4.23 cm  Diastology LVIDs:         2.31 cm  LV e' lateral:   5.87 cm/s LV PW:         0.94 cm  LV E/e' lateral: 23.7 LV IVS:        0.61 cm  LV e' medial:    4.57 cm/s LVOT diam:     1.80 cm  LV E/e' medial:  30.4 LV SV:         59 LV SV Index:   40 LVOT Area:     2.54 cm  RIGHT VENTRICLE RV Basal diam:  2.58 cm LEFT ATRIUM             Index       RIGHT ATRIUM  Index LA diam:        3.50 cm 2.35 cm/m  RA Area:     8.07 cm LA Vol (A2C):   41.7 ml 27.94 ml/m RA Volume:   13.00 ml 8.71 ml/m LA Vol (A4C):   61.6 ml 41.27 ml/m LA Biplane Vol: 51.4 ml 34.44 ml/m  AORTIC VALVE                    PULMONIC VALVE AV Area (Vmax):    0.79 cm     PV Vmax:       1.35 m/s AV Area (Vmean):   0.79 cm     PV Vmean:      78.000 cm/s AV Area (VTI):     0.78 cm     PV VTI:        0.240 m AV Vmax:           333.00 cm/s  PV Peak grad:  7.3 mmHg AV Vmean:          215.000 cm/s PV Mean grad:  3.0 mmHg AV VTI:            0.760 m AV Peak Grad:      44.4 mmHg AV Mean Grad:      23.0 mmHg LVOT Vmax:         103.00 cm/s LVOT Vmean:        67.000 cm/s LVOT VTI:           0.232 m LVOT/AV VTI ratio: 0.31  AORTA Ao Root diam: 2.80 cm MITRAL VALVE MV Area (PHT): 3.72 cm     SHUNTS MV Peak grad:  10.1 mmHg    Systemic VTI:  0.23 m MV Mean grad:  7.0 mmHg     Systemic Diam: 1.80 cm MV Vmax:       1.59 m/s MV Vmean:      122.0 cm/s MV Decel Time: 204 msec MV E velocity: 139.00 cm/s MV A velocity: 154.00 cm/s MV E/A ratio:  0.90 Christopher End MD Electronically signed by [name] MD Signature Date/Time: 04/20/2020/5:42:36 PM    Final         Subjective: Pt denies any complaints   Discharge Exam: Vitals:   04/22/20 1428 04/22/20 1432  BP: (!) 124/46   Pulse: 68   Resp: 18   Temp: 98.3 F (36.8 C)   SpO2: 98% 100%   Vitals:   04/22/20 0930 04/22/20 0935 04/22/20 1428 04/22/20 1432  BP:  (!) 118/42 (!) 124/46   Pulse: 71 71 68   Resp: 13 16 18    Temp:  98.2 F (36.8 C) 98.3 F (36.8 C)   TempSrc:  Oral Oral   SpO2: 100% 100% 98% 100%  Weight:      Height:        General: Pt is alert, awake, not in acute distress Cardiovascular: S1/S2 +, no rubs, no gallops Respiratory: decreased breath sounds b/l otherwise clear  Abdominal: Soft, NT, ND, bowel sounds + Extremities: no edema, no cyanosis    The results of significant diagnostics from this hospitalization (including imaging, microbiology, ancillary and laboratory) are listed below for reference.     Microbiology: Recent Results (from the past 240 hour(s))  SARS Coronavirus 2 by RT PCR (hospital order, performed in [facility] hospital lab) Nasopharyngeal Nasopharyngeal Swab     Status: None   Collection Time: 04/20/20 12:00 AM   Specimen: Nasopharyngeal Swab  Result Value Ref Range Status   SARS  Coronavirus 2 NEGATIVE NEGATIVE Final    Comment: (NOTE) SARS-CoV-2 target nucleic acids are NOT DETECTED. The SARS-CoV-2 RNA is generally detectable in upper and lower respiratory specimens during the acute phase of infection. The lowest concentration of SARS-CoV-2 viral copies this  assay can detect is 250 copies / mL. A negative result does not preclude SARS-CoV-2 infection and should not be used as the sole basis for treatment or other patient management decisions.  A negative result may occur with improper specimen collection / handling, submission of specimen other than nasopharyngeal swab, presence of viral mutation(s) within the areas targeted by this assay, and inadequate number of viral copies (<250 copies / mL). A negative result must be combined with clinical observations, patient history, and epidemiological information. Fact Sheet for Patients:   StrictlyIdeas.no Fact Sheet for Healthcare Providers: BankingDealers.co.za This test is not yet approved or cleared  by the [place] FDA and has been authorized for detection and/or diagnosis of SARS-CoV-2 by FDA under an Emergency Use Authorization (EUA).  This EUA will remain in effect (meaning this test can be used) for the duration of the COVID-19 declaration under Section 564(b)(1) of the Act, 21 U.S.C. section 360bbb-3(b)(1), unless the authorization is terminated or revoked sooner. Performed at [facility], [address] [place], [place] [address]   MRSA PCR Screening     Status: None   Collection Time: 04/21/20  6:58 PM   Specimen: Nasal Mucosa; Nasopharyngeal  Result Value Ref Range Status   MRSA by PCR NEGATIVE NEGATIVE Final    Comment:        The GeneXpert MRSA Assay (FDA approved for NASAL specimens only), is one component of a comprehensive MRSA colonization surveillance program. It is not intended to diagnose MRSA infection nor to guide or monitor treatment for MRSA infections. Performed at [facility], [address] [place], [place] [address]      Labs: BNP (last 3 results) Recent Labs    04/01/20 0517 04/02/20 0925 04/19/20 2338  BNP 1,260.0* 1,135.0* 174.0*   Basic Metabolic Panel: Recent Labs   Lab 04/19/20 2338 04/20/20 0936 04/21/20 0417 04/22/20 0505  NA 134* 137 140 136  K 6.0* 4.7 4.8 5.0  CL 98 95* 95* 96*  CO2 23 29 31 29   GLUCOSE 189* 148* 110* 78  BUN 60* 21 36* 45*  CREATININE 6.88* 3.26* 4.78* 6.17*  CALCIUM 8.6* 8.7* 9.0 8.4*  PHOS  --   --  3.7  --    Liver Function Tests: Recent Labs  Lab 04/19/20 2338  AST 18  ALT 9  ALKPHOS 115  BILITOT 0.7  PROT 6.7  ALBUMIN 4.0   No results for input(s): LIPASE, AMYLASE in the last 168 hours. No results for input(s): AMMONIA in the last 168 hours. CBC: Recent Labs  Lab 04/19/20 2338 04/22/20 0505  WBC 8.0 5.5  NEUTROABS 5.5  --   HGB 8.8* 7.2*  HCT 27.5* 22.6*  MCV 92.6 92.6  PLT 234 143*   Cardiac Enzymes: No results for input(s): CKTOTAL, CKMB, CKMBINDEX, TROPONINI in the last 168 hours. BNP: Invalid input(s): POCBNP CBG: Recent Labs  Lab 04/21/20 0827 04/21/20 1548 04/21/20 1744 04/21/20 2236 04/22/20 0751  GLUCAP 91 113* 163* 103* 78   D-Dimer No results for input(s): DDIMER in the last 72 hours. Hgb A1c No results for input(s): HGBA1C in the last 72 hours. Lipid Profile No results for input(s): CHOL, HDL, LDLCALC, TRIG, CHOLHDL, LDLDIRECT in the last 72 hours.  Thyroid function studies No results for input(s): TSH, T4TOTAL, T3FREE, THYROIDAB in the last 72 hours.  Invalid input(s): FREET3 Anemia work up No results for input(s): VITAMINB12, FOLATE, FERRITIN, TIBC, IRON, RETICCTPCT in the last 72 hours. Urinalysis    Component Value Date/Time   COLORURINE YELLOW (A) 12/04/2019 2054   APPEARANCEUR CLOUDY (A) 12/04/2019 2054   LABSPEC 1.008 12/04/2019 2054   PHURINE 8.0 12/04/2019 2054   GLUCOSEU NEGATIVE 12/04/2019 2054   HGBUR MODERATE (A) 12/04/2019 2054   BILIRUBINUR NEGATIVE 12/04/2019 2054   KETONESUR NEGATIVE 12/04/2019 2054   PROTEINUR 100 (A) 12/04/2019 2054   NITRITE NEGATIVE 12/04/2019 2054   LEUKOCYTESUR LARGE (A) 12/04/2019 2054   Sepsis Labs Invalid input(s):  PROCALCITONIN,  WBC,  LACTICIDVEN Microbiology Recent Results (from the past 240 hour(s))  SARS Coronavirus 2 by RT PCR (hospital order, performed in [facility] hospital lab) Nasopharyngeal Nasopharyngeal Swab     Status: None   Collection Time: 04/20/20 12:00 AM   Specimen: Nasopharyngeal Swab  Result Value Ref Range Status   SARS Coronavirus 2 NEGATIVE NEGATIVE Final    Comment: (NOTE) SARS-CoV-2 target nucleic acids are NOT DETECTED. The SARS-CoV-2 RNA is generally detectable in upper and lower respiratory specimens during the acute phase of infection. The lowest concentration of SARS-CoV-2 viral copies this assay can detect is 250 copies / mL. A negative result does not preclude SARS-CoV-2 infection and should not be used as the sole basis for treatment or other patient management decisions.  A negative result may occur with improper specimen collection / handling, submission of specimen other than nasopharyngeal swab, presence of viral mutation(s) within the areas targeted by this assay, and inadequate number of viral copies (<250 copies / mL). A negative result must be combined with clinical observations, patient history, and epidemiological information. Fact Sheet for Patients:   StrictlyIdeas.no Fact Sheet for Healthcare Providers: BankingDealers.co.za This test is not yet approved or cleared  by the [place] FDA and has been authorized for detection and/or diagnosis of SARS-CoV-2 by FDA under an Emergency Use Authorization (EUA).  This EUA will remain in effect (meaning this test can be used) for the duration of the COVID-19 declaration under Section 564(b)(1) of the Act, 21 U.S.C. section 360bbb-3(b)(1), unless the authorization is terminated or revoked sooner. Performed at [facility], [address] [place], [place] [address]   MRSA PCR Screening     Status: None   Collection Time: 04/21/20  6:58 PM    Specimen: Nasal Mucosa; Nasopharyngeal  Result Value Ref Range Status   MRSA by PCR NEGATIVE NEGATIVE Final    Comment:        The GeneXpert MRSA Assay (FDA approved for NASAL specimens only), is one component of a comprehensive MRSA colonization surveillance program. It is not intended to diagnose MRSA infection nor to guide or monitor treatment for MRSA infections. Performed at [facility], [address] [place], [place] [address]      Time coordinating discharge: Over 30 minutes  SIGNED:   Wyvonnia Dusky, MD  [facility] 04/22/2020, 2:34 PM Pager   If 7PM-7AM, please contact night-coverage [email or web address]

## 2020-04-22 NOTE — Progress Notes (Signed)
OT Cancellation Note  Patient Details Name: Phyllis [name] MRN: [id number] DOB: 14-Jan-1946   Cancelled Treatment:    Reason Eval/Treat Not Completed: Patient at procedure or test/ unavailable  OT consult received and chart reviewed. Pt off floor at HD at this time. Will f/u at later date/time as able for OT evaluation. Thank you.  Gerrianne Scale, [place], OTR/L ascom [phone] 04/22/20, 11:16 AM

## 2020-04-22 NOTE — Progress Notes (Signed)
SATURATION QUALIFICATIONS: (This note is used to comply with regulatory documentation for home oxygen)  Patient Saturations on Room Air at Rest = 100%  Patient Saturations on Room Air while Ambulating = 98%   Please briefly explain why patient needs home oxygen:

## 2020-04-22 NOTE — Progress Notes (Signed)
PT Cancellation Note  Patient Details Name: Phyllis [name] MRN: [id number] DOB: Apr 03, 1946   Cancelled Treatment:    Reason Eval/Treat Not Completed: Patient at procedure or test/unavailable(Consult received and chart reviewed. Patient currently in dialysis; unavailable for PT evaluation at this time.  Will re-attempt at later time/date as medically appropriate and available.)   Reda Citron H. Owens Shark, PT, DPT, NCS 04/22/20, 11:12 AM [phone]

## 2020-04-22 NOTE — Progress Notes (Signed)
Phyllis [name]  A and O x 4 VSS. Pt tolerating diet well. No complaints of pain or nausea. IV removed intact, prescriptions given. Pt voices understanding of discharge instructions with no further questions. Pt discharged via wheelchair with axillary.   Allergies as of 04/22/2020      Reactions   Enalapril Maleate Other (See Comments)   Other reaction(s): Headache   Nitrofurantoin Swelling, Rash   Other Reaction: swelling of body   Sulfamethoxazole-trimethoprim Swelling   2,4-d Dimethylamine (amisol) Rash, Other (See Comments)   Other Reaction: h/a   Baclofen Other (See Comments), Nausea Only   lightheadness ,drowsiness , muscle weakness , twitching in hands    Neosporin [neomycin-bacitracin Zn-polymyx] Other (See Comments), Rash   Other Reaction: irritation Skin irritation   Quinine Nausea And Vomiting, Rash, Other (See Comments)   Other Reaction: Vomiting, rash, h/a, vision   Ultram [tramadol] Palpitations   Zocor [simvastatin] Other (See Comments), Rash   Other Reaction: muscle spasms Muscle pain and spasms   Bactrim [sulfamethoxazole-trimethoprim] Swelling   Levodopa Other (See Comments)   Reaction: unknown   Macrodantin [nitrofurantoin Macrocrystal] Swelling   Quinine Derivatives Other (See Comments)   Vertigo,nausea vomiting blurred vision headache ears sensitivity      Medication List    TAKE these medications   acetaminophen 500 MG tablet Commonly known as: TYLENOL Take 1 tablet (500 mg total) by mouth every 6 (six) hours as needed for mild pain or fever.   albuterol 108 (90 Base) MCG/ACT inhaler Commonly known as: VENTOLIN HFA Inhale 2 puffs into the lungs every 6 (six) hours as needed for wheezing or shortness of breath. Out of medication   amLODipine 10 MG tablet Commonly known as: NORVASC Take 10 mg by mouth daily.   Aspirin 81 81 MG EC tablet Generic drug: aspirin Take 1 tablet by mouth daily.   benzonatate 100 MG capsule Commonly known as:  TESSALON Take 100 mg by mouth 3 (three) times daily.   calcium acetate 667 MG capsule Commonly known as: PHOSLO Take 2 capsules by mouth in the morning, at noon, and at bedtime.   carvedilol 12.5 MG tablet Commonly known as: COREG Take 12.5 mg by mouth 2 (two) times daily with a meal.   cetirizine 10 MG tablet Commonly known as: ZYRTEC Take 10 mg by mouth daily as needed for allergies.   clopidogrel 75 MG tablet Commonly known as: PLAVIX Take 1 tablet (75 mg total) by mouth daily.   [facility] Melatonin 5 MG Chew Generic drug: Melatonin Chew 1 tablet by mouth at bedtime.   epoetin alfa 10000 UNIT/ML injection Commonly known as: EPOGEN Inject 0.4 mLs (4,000 Units total) into the vein Every Tuesday,Thursday,and Saturday with dialysis.   furosemide 80 MG tablet Commonly known as: Lasix Take 1 tablet (80 mg total) by mouth daily.   hydrALAZINE 50 MG tablet Commonly known as: APRESOLINE TAKE 1 TABLET BY MOUTH EVERY 8 HOURS What changed: when to take this   HYDROcodone-acetaminophen 7.5-325 MG tablet Commonly known as: NORCO Take 1 tablet by mouth every 6 (six) hours as needed (pain). What changed:   how much to take  when to take this   ipratropium-albuterol 0.5-2.5 (3) MG/3ML Soln Commonly known as: DUONEB Take 3 mLs by nebulization every 6 (six) hours as needed.   iron polysaccharides 150 MG capsule Commonly known as: NIFEREX Take 150 mg by mouth daily.   lidocaine 5 % Commonly known as: LIDODERM Place 1 patch onto the skin daily. Remove &  Discard patch within 12 hours or as directed by MD   lidocaine-prilocaine cream Commonly known as: EMLA Apply 1 application topically as needed (prior to treatment).   losartan 50 MG tablet Commonly known as: COZAAR Take 50 mg by mouth daily.   mirtazapine 15 MG tablet Commonly known as: REMERON Take 15 mg by mouth at bedtime.   mometasone 50 MCG/ACT nasal spray Commonly known as: NASONEX Place 2 sprays into the nose  daily as needed (allergies).   oxybutynin 15 MG 24 hr tablet Commonly known as: DITROPAN XL Take 15 mg by mouth daily.   pravastatin 40 MG tablet Commonly known as: PRAVACHOL Take 40 mg by mouth at bedtime.   sertraline 50 MG tablet Commonly known as: ZOLOFT Take 50 mg by mouth at bedtime.   topiramate 25 MG tablet Commonly known as: TOPAMAX Take 1 tablet (25 mg total) by mouth as needed (for headaches).       Vitals:   04/22/20 1428 04/22/20 1432  BP: (!) 124/46   Pulse: 68   Resp: 18   Temp: 98.3 F (36.8 C)   SpO2: 98% 100%    Phyllis [name] Phyllis [name]

## 2020-04-22 NOTE — TOC Transition Note (Addendum)
Transition of Care [facility]) - CM/SW Discharge Note   Patient Details  Name: Phyllis [name] MRN: [id number] Date of Birth: 11/06/1946  [facility] CM/SW Contact:  Beverly Sessions, RN Phone Number: 04/22/2020, 2:53 PM   Clinical Narrative:    Patient to discharge home today Corene Cornea with [facility] notified of discharge. [facility] requesting palliative consult, and repeat chest xray 1 week after discharge.  MD notified and deferred to PCP  Bedside RN to provide patient with incentive spirometer to take home at discharge  Patient has been weaned to RA prior to discharge.  Requested for bedside RN to check sats.   Elvera Bicker dialysis liaison notified of discharge  Nursing staff has checked ambulation sats and were 97-100 on RA   Final next level of care: [place] Barriers to Discharge: No Barriers Identified   Patient Goals and CMS Choice Patient states their goals for this hospitalization and ongoing recovery are:: Get back home and feel better      Discharge Placement                       Discharge Plan and Services   Discharge Planning Services: NA                      HH Arranged: RN, PT [facility] Agency: [place] ([place]) Date [facility] Agency Contacted: 04/22/20   Representative spoke with at [place]: [place] ([place]) Interventions     Readmission Risk Interventions Readmission Risk Prevention Plan 04/22/2020 03/31/2020 10/13/2019  Transportation Screening Complete Complete -  Medication Review Press photographer) Complete Complete -  PCP or Specialist appointment within 3-5 days of discharge (No Data) - Complete  HRI or Home Care Consult Complete - (No Data)  SW Recovery Care/Counseling Consult - - Complete  Palliative Care Screening Not Applicable - Not Applicable  Skilled Nursing Facility Not Applicable - Complete  Some recent data might be hidden

## 2020-04-22 NOTE — Progress Notes (Signed)
[facility]  ROUNDING NOTE   Subjective:  Patient underwent dialysis treatment today. Tolerated well. Overall feeling much better.   Objective:  Vital signs in last 24 hours:  Temp:  [97.9 F (36.6 C)-98.3 F (36.8 C)] 98.2 F (36.8 C) (06/03 0935) Pulse Rate:  [54-73] 71 (06/03 0935) Resp:  [13-22] 16 (06/03 0935) BP: (95-162)/(34-54) 118/42 (06/03 0935) SpO2:  [100 %] 100 % (06/03 0935) Weight:  [56.2 kg] 56.2 kg (06/03 0500)  Weight change:  Filed Weights   04/20/20 0018 04/21/20 1130 04/22/20 0500  Weight: 56.8 kg 56.8 kg 56.2 kg    Intake/Output: I/O last 3 completed shifts: In: 0  Out: 2700 [Urine:700; Other:2000]   Intake/Output this shift:  No intake/output data recorded.  Physical Exam: General: No acute distress  Head: Normocephalic, atraumatic. Moist oral mucosal membranes  Eyes: Anicteric  Neck: Supple, trachea midline  Lungs:  Clear to auscultation, normal effort  Heart: S1S2 no rubs, 2/6 systolic ejection murmur  Abdomen:  Soft, nontender, bowel sounds present  Extremities: Trace peripheral edema.  Neurologic: Awake, alert, following commands  Skin: No lesions  Access: Left upper extremity AV fistula    Basic Metabolic Panel: Recent Labs  Lab 04/19/20 2338 04/19/20 2338 04/20/20 0936 04/21/20 0417 04/22/20 0505  NA 134*  --  137 140 136  K 6.0*  --  4.7 4.8 5.0  CL 98  --  95* 95* 96*  CO2 23  --  29 31 29   GLUCOSE 189*  --  148* 110* 78  BUN 60*  --  21 36* 45*  CREATININE 6.88*  --  3.26* 4.78* 6.17*  CALCIUM 8.6*   < > 8.7* 9.0 8.4*  PHOS  --   --   --  3.7  --    < > = values in this interval not displayed.    Liver Function Tests: Recent Labs  Lab 04/19/20 2338  AST 18  ALT 9  ALKPHOS 115  BILITOT 0.7  PROT 6.7  ALBUMIN 4.0   No results for input(s): LIPASE, AMYLASE in the last 168 hours. No results for input(s): AMMONIA in the last 168 hours.  CBC: Recent Labs  Lab 04/19/20 2338 04/22/20 0505   WBC 8.0 5.5  NEUTROABS 5.5  --   HGB 8.8* 7.2*  HCT 27.5* 22.6*  MCV 92.6 92.6  PLT 234 143*    Cardiac Enzymes: No results for input(s): CKTOTAL, CKMB, CKMBINDEX, TROPONINI in the last 168 hours.  BNP: Invalid input(s): POCBNP  CBG: Recent Labs  Lab 04/21/20 0827 04/21/20 1548 04/21/20 1744 04/21/20 2236 04/22/20 0751  GLUCAP 91 113* 163* 103* 54    Microbiology: Results for orders placed or performed during the hospital encounter of 04/19/20  SARS Coronavirus 2 by RT PCR (hospital order, performed in [facility] hospital lab) Nasopharyngeal Nasopharyngeal Swab     Status: None   Collection Time: 04/20/20 12:00 AM   Specimen: Nasopharyngeal Swab  Result Value Ref Range Status   SARS Coronavirus 2 NEGATIVE NEGATIVE Final    Comment: (NOTE) SARS-CoV-2 target nucleic acids are NOT DETECTED. The SARS-CoV-2 RNA is generally detectable in upper and lower respiratory specimens during the acute phase of infection. The lowest concentration of SARS-CoV-2 viral copies this assay can detect is 250 copies / mL. A negative result does not preclude SARS-CoV-2 infection and should not be used as the sole basis for treatment or other patient management decisions.  A negative result may occur with improper specimen collection /  handling, submission of specimen other than nasopharyngeal swab, presence of viral mutation(s) within the areas targeted by this assay, and inadequate number of viral copies (<250 copies / mL). A negative result must be combined with clinical observations, patient history, and epidemiological information. Fact Sheet for Patients:   StrictlyIdeas.no Fact Sheet for Healthcare Providers: BankingDealers.co.za This test is not yet approved or cleared  by the [place] FDA and has been authorized for detection and/or diagnosis of SARS-CoV-2 by FDA under an Emergency Use Authorization (EUA).  This EUA will  remain in effect (meaning this test can be used) for the duration of the COVID-19 declaration under Section 564(b)(1) of the Act, 21 U.S.C. section 360bbb-3(b)(1), unless the authorization is terminated or revoked sooner. Performed at [facility], [address] [place], [place] [address]   MRSA PCR Screening     Status: None   Collection Time: 04/21/20  6:58 PM   Specimen: Nasal Mucosa; Nasopharyngeal  Result Value Ref Range Status   MRSA by PCR NEGATIVE NEGATIVE Final    Comment:        The GeneXpert MRSA Assay (FDA approved for NASAL specimens only), is one component of a comprehensive MRSA colonization surveillance program. It is not intended to diagnose MRSA infection nor to guide or monitor treatment for MRSA infections. Performed at [facility], [address] [place], [place] [address]     Coagulation Studies: No results for input(s): LABPROT, INR in the last 72 hours.  Urinalysis: No results for input(s): COLORURINE, LABSPEC, PHURINE, GLUCOSEU, HGBUR, BILIRUBINUR, KETONESUR, PROTEINUR, UROBILINOGEN, NITRITE, LEUKOCYTESUR in the last 72 hours.  Invalid input(s): APPERANCEUR    Imaging: No results found.   Medications:   . sodium chloride     . amLODipine  10 mg Oral Daily  . aspirin EC  81 mg Oral Daily  . calcium acetate  1,334 mg Oral TID WC  . carvedilol  12.5 mg Oral BID WC  . Chlorhexidine Gluconate Cloth  6 each Topical Q0600  . clopidogrel  75 mg Oral Daily  . furosemide  60 mg Intravenous BID  . heparin  5,000 Units Subcutaneous Q8H  . hydrALAZINE  50 mg Oral Q8H  . insulin aspart  0-15 Units Subcutaneous TID WC  . iron polysaccharides  150 mg Oral Daily  . lidocaine  1 patch Transdermal Q24H  . melatonin  5 mg Oral QHS  . mirtazapine  15 mg Oral QHS  . oxybutynin  15 mg Oral Daily  . pravastatin  40 mg Oral QHS  . sertraline  50 mg Oral QHS  . sodium chloride flush  3 mL Intravenous Q12H   sodium chloride,  acetaminophen, albuterol, fluticasone, HYDROcodone-acetaminophen, ipratropium-albuterol, lidocaine-prilocaine, ondansetron (ZOFRAN) IV, sodium chloride flush, topiramate  Assessment/ Plan:  74 y.o. female  with past medical history of ESRD on HD TTS, aortic stenosis, hypertension, COPD, diabetes mellitus type 2, overactive bladder, gout, GERD, chronic costochondritis admitted with Hyperkalemia [E87.5] Dyspnea [R06.00] Flash pulmonary edema (HCC) [J81.0] Acute respiratory failure with hypoxia ([place]) [J96.01]   [facility]/TTS/[facility] [address]/left upper extremity AV fistula  1.  ESRD on HD TTS.  Patient had ultrafiltration session yesterday and tolerated well.  She underwent dialysis treatment today as well.  Next dialysis treatment for Saturday.  We will plan one extra session of ultrafiltration every other week on a Monday.  2.  Anemia of chronic kidney disease.  Hemoglobin currently 7.2.  We will plan to resume Epogen as an outpatient.  3.  Secondary hyperparathyroidism.  Maintain the patient on calcium acetate 2 tablets p.o. 3 times daily with meals.  4.  Hyperkalemia.  Potassium acceptable at 5.0.  Continue to monitor.   LOS: 2 Phyllis [name] 6/3/20212:08 PM

## 2020-04-24 IMAGING — DX CHEST  1 VIEW
1 series · 1 of 1 positions shown · non-contrast
Comparison: 05/06/2019.

CLINICAL DATA: Shortness of breath.

EXAM:
CHEST  1 VIEW

[chest ap]
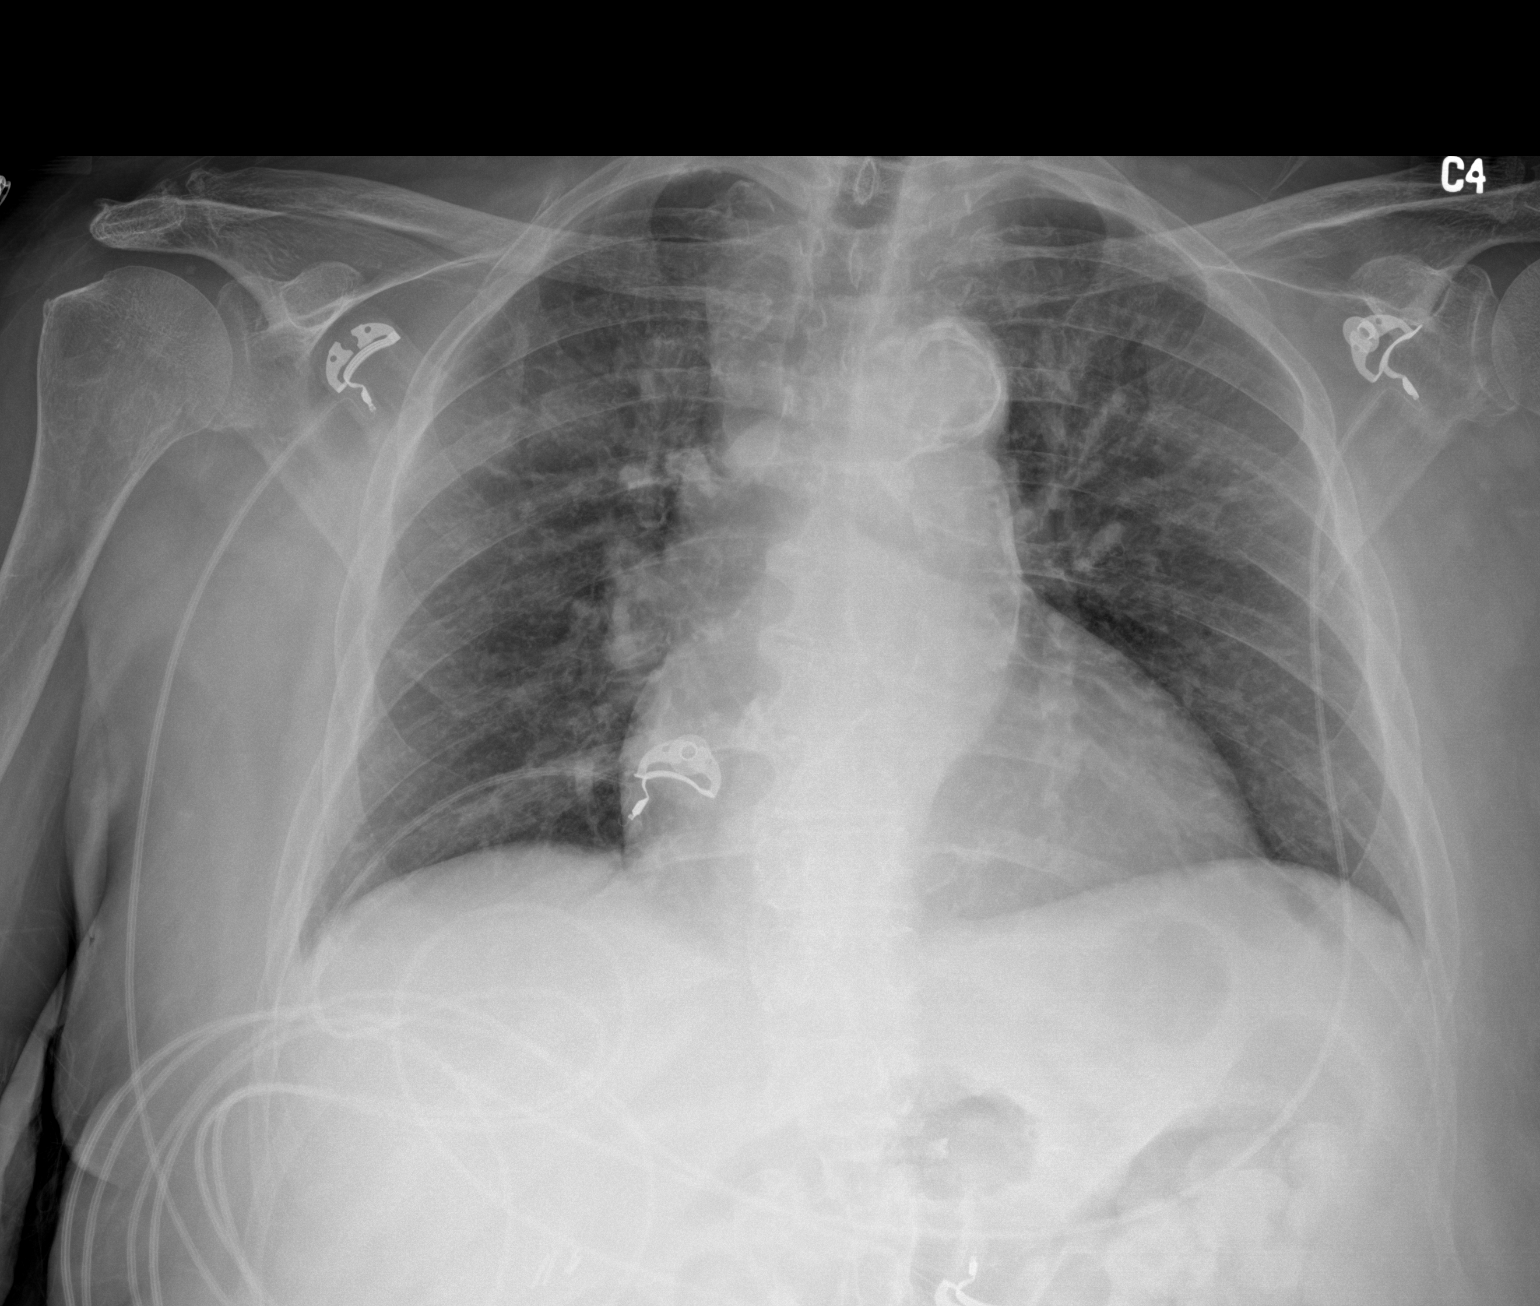

[1 of 1 positions shown; findings below may reference images not displayed]

FINDINGS: Mediastinum hilar structures normal. Cardiomegaly. Bilateral
interstitial prominence, improved from prior exam. Findings suggest
improving CHF and/or pneumonitis. No pleural effusion or
pneumothorax.
IMPRESSION: Cardiomegaly with mild pulmonary interstitial prominence. Improved
interstitial prominence from prior exam. Findings most consistent
with improving CHF.

## 2020-04-27 ENCOUNTER — Inpatient Hospital Stay
Admission: EM | Admit: 2020-04-27 | Discharge: 2020-04-30 | DRG: 291 | Disposition: A | Discharge status: Home / Self Care | Payer: Medicare Other | Attending: Internal Medicine | Admitting: Internal Medicine

## 2020-04-27 ENCOUNTER — Emergency Department: Payer: Medicare Other

## 2020-04-27 ENCOUNTER — Telehealth: Payer: Self-pay | Admitting: Family

## 2020-04-27 ENCOUNTER — Other Ambulatory Visit: Payer: Self-pay

## 2020-04-27 DIAGNOSIS — I350 Nonrheumatic aortic (valve) stenosis: Secondary | ICD-10-CM | POA: Diagnosis present

## 2020-04-27 DIAGNOSIS — Z79899 Other long term (current) drug therapy: Secondary | ICD-10-CM

## 2020-04-27 DIAGNOSIS — J441 Chronic obstructive pulmonary disease with (acute) exacerbation: Secondary | ICD-10-CM | POA: Diagnosis present

## 2020-04-27 DIAGNOSIS — R0602 Shortness of breath: Secondary | ICD-10-CM

## 2020-04-27 DIAGNOSIS — J9601 Acute respiratory failure with hypoxia: Secondary | ICD-10-CM | POA: Diagnosis not present

## 2020-04-27 DIAGNOSIS — K219 Gastro-esophageal reflux disease without esophagitis: Secondary | ICD-10-CM | POA: Diagnosis present

## 2020-04-27 DIAGNOSIS — J9621 Acute and chronic respiratory failure with hypoxia: Secondary | ICD-10-CM

## 2020-04-27 DIAGNOSIS — J45909 Unspecified asthma, uncomplicated: Secondary | ICD-10-CM | POA: Diagnosis present

## 2020-04-27 DIAGNOSIS — J9600 Acute respiratory failure, unspecified whether with hypoxia or hypercapnia: Secondary | ICD-10-CM | POA: Diagnosis present

## 2020-04-27 DIAGNOSIS — N3281 Overactive bladder: Secondary | ICD-10-CM | POA: Diagnosis present

## 2020-04-27 DIAGNOSIS — Z83438 Family history of other disorder of lipoprotein metabolism and other lipidemia: Secondary | ICD-10-CM

## 2020-04-27 DIAGNOSIS — Z8616 Personal history of COVID-19: Secondary | ICD-10-CM

## 2020-04-27 DIAGNOSIS — Z7902 Long term (current) use of antithrombotics/antiplatelets: Secondary | ICD-10-CM

## 2020-04-27 DIAGNOSIS — Z7189 Other specified counseling: Secondary | ICD-10-CM | POA: Diagnosis not present

## 2020-04-27 DIAGNOSIS — I509 Heart failure, unspecified: Secondary | ICD-10-CM

## 2020-04-27 DIAGNOSIS — R911 Solitary pulmonary nodule: Secondary | ICD-10-CM | POA: Diagnosis present

## 2020-04-27 DIAGNOSIS — Z888 Allergy status to other drugs, medicaments and biological substances status: Secondary | ICD-10-CM | POA: Diagnosis not present

## 2020-04-27 DIAGNOSIS — N2581 Secondary hyperparathyroidism of renal origin: Secondary | ICD-10-CM | POA: Diagnosis present

## 2020-04-27 DIAGNOSIS — E1122 Type 2 diabetes mellitus with diabetic chronic kidney disease: Secondary | ICD-10-CM | POA: Diagnosis present

## 2020-04-27 DIAGNOSIS — N186 End stage renal disease: Secondary | ICD-10-CM | POA: Diagnosis present

## 2020-04-27 DIAGNOSIS — I5033 Acute on chronic diastolic (congestive) heart failure: Secondary | ICD-10-CM | POA: Diagnosis present

## 2020-04-27 DIAGNOSIS — Z8249 Family history of ischemic heart disease and other diseases of the circulatory system: Secondary | ICD-10-CM | POA: Diagnosis not present

## 2020-04-27 DIAGNOSIS — I132 Hypertensive heart and chronic kidney disease with heart failure and with stage 5 chronic kidney disease, or end stage renal disease: Secondary | ICD-10-CM | POA: Diagnosis present

## 2020-04-27 DIAGNOSIS — J449 Chronic obstructive pulmonary disease, unspecified: Secondary | ICD-10-CM | POA: Diagnosis not present

## 2020-04-27 DIAGNOSIS — Z992 Dependence on renal dialysis: Secondary | ICD-10-CM | POA: Diagnosis not present

## 2020-04-27 DIAGNOSIS — F419 Anxiety disorder, unspecified: Secondary | ICD-10-CM | POA: Diagnosis present

## 2020-04-27 DIAGNOSIS — R778 Other specified abnormalities of plasma proteins: Secondary | ICD-10-CM | POA: Diagnosis not present

## 2020-04-27 DIAGNOSIS — Z881 Allergy status to other antibiotic agents status: Secondary | ICD-10-CM

## 2020-04-27 DIAGNOSIS — D631 Anemia in chronic kidney disease: Secondary | ICD-10-CM | POA: Diagnosis present

## 2020-04-27 DIAGNOSIS — N189 Chronic kidney disease, unspecified: Secondary | ICD-10-CM | POA: Diagnosis not present

## 2020-04-27 DIAGNOSIS — E1169 Type 2 diabetes mellitus with other specified complication: Secondary | ICD-10-CM | POA: Diagnosis present

## 2020-04-27 DIAGNOSIS — Z515 Encounter for palliative care: Secondary | ICD-10-CM | POA: Diagnosis not present

## 2020-04-27 DIAGNOSIS — F329 Major depressive disorder, single episode, unspecified: Secondary | ICD-10-CM | POA: Diagnosis present

## 2020-04-27 DIAGNOSIS — E785 Hyperlipidemia, unspecified: Secondary | ICD-10-CM | POA: Diagnosis present

## 2020-04-27 DIAGNOSIS — Z7982 Long term (current) use of aspirin: Secondary | ICD-10-CM | POA: Diagnosis not present

## 2020-04-27 LAB — CBC
HCT: 23.5 % — ABNORMAL LOW (ref 36.0–46.0)
Hemoglobin: 8 g/dL — ABNORMAL LOW (ref 12.0–15.0)
MCH: 30.2 pg (ref 26.0–34.0)
MCHC: 34 g/dL (ref 30.0–36.0)
MCV: 88.7 fL (ref 80.0–100.0)
Platelets: 149 10*3/uL — ABNORMAL LOW (ref 150–400)
RBC: 2.65 MIL/uL — ABNORMAL LOW (ref 3.87–5.11)
RDW: 13.7 % (ref 11.5–15.5)
WBC: 7.3 10*3/uL (ref 4.0–10.5)
nRBC: 0 % (ref 0.0–0.2)

## 2020-04-27 LAB — COMPREHENSIVE METABOLIC PANEL
ALT: 13 U/L (ref 0–44)
AST: 18 U/L (ref 15–41)
Albumin: 3.9 g/dL (ref 3.5–5.0)
Alkaline Phosphatase: 101 U/L (ref 38–126)
Anion gap: 11 (ref 5–15)
BUN: 34 mg/dL — ABNORMAL HIGH (ref 8–23)
CO2: 29 mmol/L (ref 22–32)
Calcium: 8.3 mg/dL — ABNORMAL LOW (ref 8.9–10.3)
Chloride: 100 mmol/L (ref 98–111)
Creatinine, Ser: 3.99 mg/dL — ABNORMAL HIGH (ref 0.44–1.00)
GFR calc Af Amer: 12 mL/min — ABNORMAL LOW (ref >60)
GFR calc non Af Amer: 10 mL/min — ABNORMAL LOW (ref >60)
Glucose, Bld: 154 mg/dL — ABNORMAL HIGH (ref 70–99)
Potassium: 4 mmol/L (ref 3.5–5.1)
Sodium: 140 mmol/L (ref 135–145)
Total Bilirubin: 0.5 mg/dL (ref 0.3–1.2)
Total Protein: 6.3 g/dL — ABNORMAL LOW (ref 6.5–8.1)

## 2020-04-27 LAB — BRAIN NATRIURETIC PEPTIDE: B Natriuretic Peptide: 556.6 pg/mL — ABNORMAL HIGH (ref 0.0–100.0)

## 2020-04-27 LAB — TROPONIN I (HIGH SENSITIVITY)
Troponin I (High Sensitivity): 64 ng/L — ABNORMAL HIGH (ref <18)
Troponin I (High Sensitivity): 94 ng/L — ABNORMAL HIGH (ref <18)

## 2020-04-27 LAB — SARS CORONAVIRUS 2 BY RT PCR (HOSPITAL ORDER, PERFORMED IN ~~LOC~~ HOSPITAL LAB): SARS Coronavirus 2: NEGATIVE

## 2020-04-27 MED ORDER — IPRATROPIUM-ALBUTEROL 0.5-2.5 (3) MG/3ML IN SOLN: 3.0000 mL | Freq: Four times a day (QID) | RESPIRATORY_TRACT | Status: DC | PRN

## 2020-04-27 MED ORDER — HEPARIN SODIUM (PORCINE) 5000 UNIT/ML IJ SOLN
5000.0000 [IU] | Freq: Three times a day (TID) | INTRAMUSCULAR | Status: DC
  Administered 2020-04-28 – 2020-04-30 (×8): 5000 [IU] via SUBCUTANEOUS
  Filled 2020-04-27 (×8): qty 1

## 2020-04-27 MED ORDER — AMLODIPINE BESYLATE 10 MG PO TABS
10.0000 mg | ORAL_TABLET | Freq: Every day | ORAL | Status: DC
  Administered 2020-04-28: 10 mg via ORAL
  Filled 2020-04-27: qty 1

## 2020-04-27 MED ORDER — ACETAMINOPHEN 325 MG PO TABS: 650.0000 mg | ORAL_TABLET | ORAL | Status: DC | PRN

## 2020-04-27 MED ORDER — SODIUM CHLORIDE 0.9% FLUSH
3.0000 mL | Freq: Two times a day (BID) | INTRAVENOUS | Status: DC
  Administered 2020-04-28 – 2020-04-30 (×5): 3 mL via INTRAVENOUS

## 2020-04-27 MED ORDER — ZOLPIDEM TARTRATE 5 MG PO TABS: 5.0000 mg | ORAL_TABLET | Freq: Every evening | ORAL | Status: DC | PRN

## 2020-04-27 MED ORDER — HYDROCODONE-ACETAMINOPHEN 7.5-325 MG PO TABS
1.0000 | ORAL_TABLET | Freq: Every day | ORAL | Status: DC | PRN
  Administered 2020-04-28: 2 via ORAL
  Filled 2020-04-27: qty 2

## 2020-04-27 MED ORDER — ALPRAZOLAM 0.25 MG PO TABS: 0.2500 mg | ORAL_TABLET | Freq: Two times a day (BID) | ORAL | Status: DC | PRN

## 2020-04-27 MED ORDER — ONDANSETRON HCL 4 MG/2ML IJ SOLN
4.0000 mg | Freq: Four times a day (QID) | INTRAMUSCULAR | Status: DC | PRN
  Administered 2020-04-28: 4 mg via INTRAVENOUS
  Filled 2020-04-27: qty 2

## 2020-04-27 MED ORDER — PRAVASTATIN SODIUM 40 MG PO TABS
40.0000 mg | ORAL_TABLET | Freq: Every day | ORAL | Status: DC
  Administered 2020-04-28 – 2020-04-29 (×3): 40 mg via ORAL
  Filled 2020-04-27 (×4): qty 1

## 2020-04-27 MED ORDER — FUROSEMIDE 40 MG PO TABS: 80.0000 mg | ORAL_TABLET | Freq: Every day | ORAL | Status: DC

## 2020-04-27 MED ORDER — HYDRALAZINE HCL 50 MG PO TABS
50.0000 mg | ORAL_TABLET | Freq: Three times a day (TID) | ORAL | Status: DC
  Administered 2020-04-28 – 2020-04-30 (×8): 50 mg via ORAL
  Filled 2020-04-27 (×8): qty 1

## 2020-04-27 MED ORDER — SODIUM CHLORIDE 0.9% FLUSH: 3.0000 mL | INTRAVENOUS | Status: DC | PRN

## 2020-04-27 MED ORDER — CLOPIDOGREL BISULFATE 75 MG PO TABS
75.0000 mg | ORAL_TABLET | Freq: Every day | ORAL | Status: DC
  Administered 2020-04-28 – 2020-04-30 (×2): 75 mg via ORAL
  Filled 2020-04-27 (×2): qty 1

## 2020-04-27 MED ORDER — SERTRALINE HCL 50 MG PO TABS
50.0000 mg | ORAL_TABLET | Freq: Every day | ORAL | Status: DC
  Filled 2020-04-27 (×2): qty 1

## 2020-04-27 MED ORDER — ALBUTEROL SULFATE (2.5 MG/3ML) 0.083% IN NEBU: 3.0000 mL | INHALATION_SOLUTION | Freq: Four times a day (QID) | RESPIRATORY_TRACT | Status: DC | PRN

## 2020-04-27 MED ORDER — CARVEDILOL 12.5 MG PO TABS
12.5000 mg | ORAL_TABLET | Freq: Two times a day (BID) | ORAL | Status: DC
  Administered 2020-04-28 – 2020-04-30 (×4): 12.5 mg via ORAL
  Filled 2020-04-27 (×5): qty 1

## 2020-04-27 MED ORDER — FUROSEMIDE 10 MG/ML IJ SOLN
60.0000 mg | Freq: Two times a day (BID) | INTRAMUSCULAR | Status: DC
  Administered 2020-04-28 – 2020-04-30 (×5): 60 mg via INTRAVENOUS
  Filled 2020-04-27 (×3): qty 6
  Filled 2020-04-27: qty 8
  Filled 2020-04-27: qty 6

## 2020-04-27 MED ORDER — FLUTICASONE PROPIONATE 50 MCG/ACT NA SUSP
2.0000 | Freq: Every day | NASAL | Status: DC | PRN
  Filled 2020-04-27: qty 16

## 2020-04-27 MED ORDER — ASPIRIN EC 81 MG PO TBEC
81.0000 mg | DELAYED_RELEASE_TABLET | Freq: Every day | ORAL | Status: DC
  Administered 2020-04-28 – 2020-04-30 (×2): 81 mg via ORAL
  Filled 2020-04-27 (×2): qty 1

## 2020-04-27 MED ORDER — SODIUM CHLORIDE 0.9 % IV SOLN: 250.0000 mL | INTRAVENOUS | Status: DC | PRN

## 2020-04-27 MED ORDER — POLYSACCHARIDE IRON COMPLEX 150 MG PO CAPS
150.0000 mg | ORAL_CAPSULE | Freq: Every day | ORAL | Status: DC
  Administered 2020-04-28 – 2020-04-30 (×2): 150 mg via ORAL
  Filled 2020-04-27 (×3): qty 1

## 2020-04-27 MED ORDER — BENZONATATE 100 MG PO CAPS
100.0000 mg | ORAL_CAPSULE | Freq: Three times a day (TID) | ORAL | Status: DC
  Administered 2020-04-29 – 2020-04-30 (×3): 100 mg via ORAL
  Filled 2020-04-27 (×5): qty 1

## 2020-04-27 MED ORDER — OXYBUTYNIN CHLORIDE ER 5 MG PO TB24
15.0000 mg | ORAL_TABLET | Freq: Every day | ORAL | Status: DC
  Administered 2020-04-28 – 2020-04-30 (×2): 15 mg via ORAL
  Filled 2020-04-27 (×3): qty 1

## 2020-04-27 MED ORDER — MELATONIN 5 MG PO TABS
5.0000 mg | ORAL_TABLET | Freq: Every day | ORAL | Status: DC
  Administered 2020-04-28 – 2020-04-29 (×3): 5 mg via ORAL
  Filled 2020-04-27 (×4): qty 1

## 2020-04-27 MED ORDER — LORATADINE 10 MG PO TABS
10.0000 mg | ORAL_TABLET | Freq: Every day | ORAL | Status: DC
  Administered 2020-04-28 – 2020-04-30 (×2): 10 mg via ORAL
  Filled 2020-04-27 (×2): qty 1

## 2020-04-27 MED ORDER — MIRTAZAPINE 15 MG PO TABS
15.0000 mg | ORAL_TABLET | Freq: Every day | ORAL | Status: DC
  Administered 2020-04-28 – 2020-04-29 (×3): 15 mg via ORAL
  Filled 2020-04-27 (×3): qty 1

## 2020-04-27 MED ORDER — CALCIUM ACETATE (PHOS BINDER) 667 MG PO CAPS
1334.0000 mg | ORAL_CAPSULE | Freq: Three times a day (TID) | ORAL | Status: DC
  Administered 2020-04-28 – 2020-04-30 (×4): 1334 mg via ORAL
  Filled 2020-04-27 (×9): qty 2

## 2020-04-27 MED ORDER — LOSARTAN POTASSIUM 50 MG PO TABS
50.0000 mg | ORAL_TABLET | Freq: Every day | ORAL | Status: DC
  Administered 2020-04-28: 50 mg via ORAL
  Filled 2020-04-27: qty 1

## 2020-04-27 NOTE — Telephone Encounter (Signed)
Unable to reach patient regarding her follow up [facility] appointment that was made after her recent hospital discharge.      Alyse Low, [place]

## 2020-04-27 NOTE — ED Triage Notes (Signed)
Pt BIB EMS from home after caretaker noted concers. Pt was at home on cpap at 84%. Pt was here within the last week with pulmonary edema. Pt had dialysis today.

## 2020-04-27 NOTE — ED Notes (Signed)
Pt requested to be removed from bipap/cpap machine. Pt placed on 2LPM via [place] and is saturating at 100% Admitting team notified.

## 2020-04-27 NOTE — H&P (Addendum)
[facility] NAME: Phyllis [name]    MR#:  [id number]  DATE OF BIRTH:  07-01-1946  DATE OF ADMISSION:  04/27/2020  PRIMARY CARE PHYSICIAN: [name], MD   REQUESTING/REFERRING PHYSICIAN: Harvest Dark, MD CHIEF COMPLAINT:   Chief Complaint  Patient presents with  . Shortness of Breath    HISTORY OF PRESENT ILLNESS:  Phyllis [name]  is a 74 y.o. Caucasian female with a known history of end-stage renal disease on hemodialysis and diastolic CHF, asthma, COPD, type 2 diabetes mellitus and hypertension, who presented to the emergency room with acute onset of worsening dyspnea after having hemodialysis today with associated orthopnea and wheezing without significant cough.  No fever or chills.  No nausea or vomiting or abdominal pain.  No chest pain or palpitations.  She denied any headache or dizziness or blurred vision.  She was initially placed on CPAP for O2 sat of 82% and it came up to 95% but then she came to the ER and was then switched to BiPAP.Marland Kitchen  Upon presentation the emergency room, blood pressure was 145/43 with respiratory rate of 25 with otherwise normal vital signs.  Labs revealed a BUN of 34 and creatinine of 3.99 with a protein 6.3.  BNP was 556.9 compared to 960 few days ago.  Troponin I was 64 and later 94.  Hemoglobin was eight hematocrit 23.5 better than previous levels.  COVID-19 PCR came back negative.  Portable chest ray showed aortic atherosclerosis and low lung volumes without acute cardiopulmonary disease. EKG showed normal sinus rhythm with rate of 84 with nonspecific intraventricular conduction delay.  The patient will be admitted to a progressive cardiac unit bed for further evaluation and management. Without acute cardiopulmonary disease.   Past Medical History:  Diagnosis Date  . (HFpEF) heart failure with preserved ejection fraction ([place])    a. TTE 01/2014: nl LV sys fxn, no valvular abnormalities; b. TTE 11/16:  nl EF, mild LVH;  c. 04/2019 Echo: EF 60-65%. DD. Nl RV fxn. Mod AS. 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  . Allergy   . Anemia of chronic disease   . Anxiety   . Aortic atherosclerosis ([place])   . Asthma   . Chronic back pain   . COPD (chronic obstructive pulmonary disease) ([place])   . Diabetes mellitus with complication ([place])   . ESRD on hemodialysis ([place])    a. Tues/Sat; b. 2/2 small kidneys  . Essential hypertension   . Fistula    lower left arm  . GERD (gastroesophageal reflux disease)   . Gout   . History of exercise stress test    a. 01/2014: no evidence of ischemia; b. Lexiscan 08/2015: no sig ischemia, severe GI uptake artifact, low risk; c. CPET @ [facility] 09/2016: exercised 3 min 12 sec on bike without incline, 2.28 METs, VO2 of 8.1, 48% of predicted, indicating mod to sev functional impairment, evidence of blunted HR, stroke volume, and BP augmentation as well as ventilation-perfusion mismatch with exercise  . HLD (hyperlipidemia)   . Non-obstructive Carotid arterial disease ([place])    a. 12/2017: <50% bilat ICA dzs.  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 Severe aortic stenosis    a. by 09/2019 echo b. 04/2019 Echo: Mod AS.  Marland Kitchen Sleep apnea     PAST SURGICAL HISTORY:   Past Surgical History:  Procedure Laterality Date  . CARDIAC CATHETERIZATION    . carpel  tunnel    . GALLBLADDER SURGERY    . PERIPHERAL VASCULAR CATHETERIZATION N/A 04/12/2015   Procedure: A/V Shuntogram/Fistulagram;  Surgeon: Algernon Huxley, MD;  Location: [facility];  Service: Cardiovascular;  Laterality: N/A;  . PERIPHERAL VASCULAR CATHETERIZATION N/A 04/12/2015   Procedure: A/V Shunt Intervention;  Surgeon: Algernon Huxley, MD;  Location: [facility];  Service: Cardiovascular;  Laterality: N/A;  . PERIPHERAL VASCULAR CATHETERIZATION N/A 06/09/2015   Procedure: Dialysis/Perma Catheter Removal;  Surgeon: Katha Cabal, MD;  Location: [facility];   Service: Cardiovascular;  Laterality: N/A;  . RIGHT/LEFT HEART CATH AND CORONARY ANGIOGRAPHY N/A 10/14/2019   Procedure: RIGHT/LEFT HEART CATH AND CORONARY ANGIOGRAPHY;  Surgeon: Belva Crome, MD;  Location: [facility];  Service: Cardiovascular;  Laterality: N/A;    SOCIAL HISTORY:   Social History   Tobacco Use  . Smoking status: Never Smoker  . Smokeless tobacco: Never Used  Substance Use Topics  . Alcohol use: No    FAMILY HISTORY:   Family History  Problem Relation Age of Onset  . Hypertension Mother   . Hyperlipidemia Mother   . Heart disease Father   . Heart attack Father 36  . Hypertension Father   . Hyperlipidemia Father   . Heart disease Brother        CABG   . Heart attack Brother   . Breast cancer Neg Hx     DRUG ALLERGIES:   Allergies  Allergen Reactions  . Enalapril Maleate Other (See Comments)    Other reaction(s): Headache  . Nitrofurantoin Swelling and Rash    Other Reaction: swelling of body  . Sulfamethoxazole-Trimethoprim Swelling  . 2,4-D Dimethylamine (Amisol) Rash and Other (See Comments)    Other Reaction: h/a  . Baclofen Other (See Comments) and Nausea Only    lightheadness ,drowsiness , muscle weakness , twitching in hands   . Neosporin [Neomycin-Bacitracin Zn-Polymyx] Other (See Comments) and Rash    Other Reaction: irritation Skin irritation   . Quinine Nausea And Vomiting, Rash and Other (See Comments)    Other Reaction: Vomiting, rash, h/a, vision  . Ultram [Tramadol] Palpitations  . Zocor [Simvastatin] Other (See Comments) and Rash    Other Reaction: muscle spasms Muscle pain and spasms  . Bactrim [Sulfamethoxazole-Trimethoprim] Swelling  . Levodopa Other (See Comments)    Reaction: unknown  . Macrodantin [Nitrofurantoin Macrocrystal] Swelling  . Quinine Derivatives Other (See Comments)    Vertigo,nausea vomiting blurred vision headache ears sensitivity     REVIEW OF SYSTEMS:   ROS As per history of present  illness. All pertinent systems were reviewed above. Constitutional,  HEENT, cardiovascular, respiratory, GI, GU, musculoskeletal, neuro, psychiatric, endocrine,  integumentary and hematologic systems were reviewed and are otherwise  negative/unremarkable except for positive findings mentioned above in the HPI.   MEDICATIONS AT HOME:   Prior to Admission medications   Medication Sig Start Date End Date Taking? Authorizing Provider  acetaminophen (TYLENOL) 500 MG tablet Take 1 tablet (500 mg total) by mouth every 6 (six) hours as needed for mild pain or fever. 09/29/19   Danford, Suann Larry, MD  albuterol (PROVENTIL HFA;VENTOLIN HFA) 108 (90 Base) MCG/ACT inhaler Inhale 2 puffs into the lungs every 6 (six) hours as needed for wheezing or shortness of breath. Out of medication    [provider]  amLODipine (NORVASC) 10 MG tablet Take 10 mg by mouth daily.  12/29/13   [provider]  aspirin (ASPIRIN 81) 81  MG EC tablet Take 1 tablet by mouth daily.  01/27/20   [provider]  benzonatate (TESSALON) 100 MG capsule Take 100 mg by mouth 3 (three) times daily. 04/14/20   [provider]  calcium acetate (PHOSLO) 667 MG capsule Take 2 capsules by mouth in the morning, at noon, and at bedtime. 12/31/19   [provider]  carvedilol (COREG) 12.5 MG tablet Take 12.5 mg by mouth 2 (two) times daily with a meal.  01/09/14   [provider]  cetirizine (ZYRTEC) 10 MG tablet Take 10 mg by mouth daily as needed for allergies.     [provider]  clopidogrel (PLAVIX) 75 MG tablet Take 1 tablet (75 mg total) by mouth daily. 01/18/18   Epifanio Lesches, MD  [facility] MELATONIN 5 MG CHEW Chew 1 tablet by mouth at bedtime. 02/02/20   [provider]  epoetin alfa (EPOGEN) 10000 UNIT/ML injection Inject 0.4 mLs (4,000 Units total) into the vein Every Tuesday,Thursday,and Saturday with dialysis. 04/03/20   Nolberto Hanlon, MD  furosemide (LASIX) 80 MG  tablet Take 1 tablet (80 mg total) by mouth daily. 04/02/20 04/02/21  Nolberto Hanlon, MD  hydrALAZINE (APRESOLINE) 50 MG tablet TAKE 1 TABLET BY MOUTH EVERY 8 HOURS Patient taking differently: Take 50 mg by mouth 3 (three) times daily.  04/02/20   Minna Merritts, MD  HYDROcodone-acetaminophen (NORCO) 7.5-325 MG tablet Take 1 tablet by mouth every 6 (six) hours as needed (pain). Patient taking differently: Take 1-2 tablets by mouth daily as needed (pain).  10/08/19   Danford, Suann Larry, MD  ipratropium-albuterol (DUONEB) 0.5-2.5 (3) MG/3ML SOLN Take 3 mLs by nebulization every 6 (six) hours as needed. 10/17/19   [facility], Tami Lin, PA  iron polysaccharides (NIFEREX) 150 MG capsule Take 150 mg by mouth daily. 01/09/20 01/08/21  [provider]  lidocaine (LIDODERM) 5 % Place 1 patch onto the skin daily. Remove & Discard patch within 12 hours or as directed by MD 10/14/19   Flora Lipps, MD  lidocaine-prilocaine (EMLA) cream Apply 1 application topically as needed (prior to treatment).     [provider]  losartan (COZAAR) 50 MG tablet Take 50 mg by mouth daily. 03/18/19   [provider]  mirtazapine (REMERON) 15 MG tablet Take 15 mg by mouth at bedtime. 02/25/20   [provider]  mometasone (NASONEX) 50 MCG/ACT nasal spray Place 2 sprays into the nose daily as needed (allergies).  01/10/20   [provider]  oxybutynin (DITROPAN XL) 15 MG 24 hr tablet Take 15 mg by mouth daily. 12/24/17   [provider]  pravastatin (PRAVACHOL) 40 MG tablet Take 40 mg by mouth at bedtime.     [provider]  sertraline (ZOLOFT) 50 MG tablet Take 50 mg by mouth at bedtime. 03/02/20   [provider]  topiramate (TOPAMAX) 25 MG tablet Take 1 tablet (25 mg total) by mouth as needed (for headaches). Patient not taking: Reported on 04/20/2020 09/07/19   Henreitta Leber, MD      VITAL SIGNS:  Blood pressure (!) 141/43, pulse 68, temperature 98.2 F  (36.8 C), resp. rate 20, height 4\' 10"  (1.473 m), weight 56.2 kg, SpO2 100 %.  PHYSICAL EXAMINATION:  Physical Exam  GENERAL:  74 y.o.-year-old Caucasian female patient lying in the bed with mild respiratory distress on BiPAP. EYES: Pupils equal, round, reactive to light and accommodation. No scleral icterus. Extraocular muscles intact.  HEENT: Head atraumatic, normocephalic. Oropharynx and nasopharynx clear.  NECK:  Supple, no jugular venous distention. No thyroid enlargement, no tenderness.  LUNGS: Diminished bibasal breath sounds. CARDIOVASCULAR: Regular rate and rhythm, S1, S2 normal. No murmurs, rubs, or gallops.  ABDOMEN: Soft, nondistended, nontender. Bowel sounds present.  She has a palpable nontender reducible ventral hernia. EXTREMITIES: No pedal edema, cyanosis, or clubbing.  NEUROLOGIC: Cranial nerves II through XII are intact. Muscle strength 5/5 in all extremities. Sensation intact. Gait not checked.  PSYCHIATRIC: The patient is alert and oriented x 3.  Normal affect and good eye contact. SKIN: No obvious rash, lesion, or ulcer.   LABORATORY PANEL:   CBC Recent Labs  Lab 04/27/20 1918  WBC 7.3  HGB 8.0*  HCT 23.5*  PLT 149*   ------------------------------------------------------------------------------------------------------------------  Chemistries  Recent Labs  Lab 04/27/20 1918  NA 140  K 4.0  CL 100  CO2 29  GLUCOSE 154*  BUN 34*  CREATININE 3.99*  CALCIUM 8.3*  AST 18  ALT 13  ALKPHOS 101  BILITOT 0.5   ------------------------------------------------------------------------------------------------------------------  Cardiac Enzymes No results for input(s): TROPONINI in the last 168 hours. ------------------------------------------------------------------------------------------------------------------  RADIOLOGY:  DG Chest Portable 1 View  Result Date: 04/27/2020 CLINICAL DATA:  74 year old female with history of shortness of breath. EXAM:  PORTABLE CHEST 1 VIEW COMPARISON:  Chest x-ray 04/19/2020. FINDINGS: Lung volumes are low. No consolidative airspace disease. No pleural effusions. No pneumothorax. No pulmonary nodule or mass noted. Pulmonary vasculature and the cardiomediastinal silhouette are within normal limits. Atherosclerosis in the thoracic aorta. IMPRESSION: 1. Low lung volumes without radiographic evidence of acute cardiopulmonary disease. 2. Aortic atherosclerosis. Electronically Signed   By: Vinnie Langton M.D.   On: 04/27/2020 19:35      IMPRESSION AND PLAN:   1.  Acute hypoxic respiratory failure secondary to acute on chronic diastolic CHF and possibly mild fluid overload associated with end-stage renal disease on hemodialysis. -The patient will be admitted to a progressive cardiac unit bed. -We will place on diuresis with IV Lasix. -She had a very recent 2D echo with EF of 60 to 65% and grade 1 diastolic dysfunction, mild left atrial dilatation and moderate to severe aortic stenosis.  The later could be a big culprit for recurrent exacerbation of her CHF. -We will follow serial troponins especially given currently elevated troponin I that could be demand ischemia from her acute CHF and associated end-stage renal disease. -Nephrology consultation will be obtained. -Notified Dr. Juleen China about the patient.  2.  Hypertension. -We will continue Norvasc, hydralazine and Cozaar as well as Coreg.  3.  Depression. -We will continue Zoloft.  4.  Dyslipidemia. -We will continue statin therapy.  5.  DVT prophylaxis. -Subcutaneous Lovenox.   All the records are reviewed and case discussed with ED provider. The plan of care was discussed in details with the patient (and family). I answered all questions. The patient agreed to proceed with the above mentioned plan. Further management will depend upon hospital course.   CODE STATUS: Full code  Status is: Inpatient  Remains inpatient appropriate because:Ongoing  diagnostic testing needed not appropriate for outpatient work up, Unsafe d/c plan, IV treatments appropriate due to intensity of illness or inability to take PO and Inpatient level of care appropriate due to severity of illness   Dispo: The patient is from: Home              Anticipated d/c is to: Home              Anticipated d/c date is:  2 days              Patient currently is not medically stable to d/c.   TOTAL TIME TAKING CARE OF THIS PATIENT: 55 minutes.    Christel Mormon M.D on 04/27/2020 at 8:48 PM  [facility]   From 7 PM-7 AM, contact night-coverage [email or web address]  CC: Primary care physician; [name], MD   Note: This dictation was prepared with Dragon dictation along with smaller phrase technology. Any transcriptional errors that result from this process are unintentional.

## 2020-04-27 NOTE — ED Notes (Addendum)
Mansy, MD notified of troponin of 94 and diastolic BP in the 72S. As per lab it is not a critical lab value.

## 2020-04-27 NOTE — ED Provider Notes (Signed)
[facility] Emergency Department Provider Note  Time seen: 7:16 PM  I have reviewed the triage vital signs and the nursing notes.   HISTORY  Chief Complaint Shortness of Breath   HPI Phyllis [name] is a 74 y.o. female with a past medical history anxiety, asthma, COPD, diabetes, ESRD on hemodialysis including treatment today, hypertension, presents to the emergency department for shortness of breath via emergency traffic.  According to the patient she had her dialysis treatment earlier today however shortly afterwards she began feeling short of breath. EMS state they found the patient on her home CPAP machine with a saturation of 84%. Patient does not wear home O2. Patient denies any chest pain. States she is beginning to feel better being on the CPAP machine with oxygen by EMS. Patient swap to BiPAP upon arrival. Per record review patient recently discharged from the hospital on 04/22/2020 after admission for dyspnea/pulmonary edema.  Past Medical History:  Diagnosis Date  . (HFpEF) heart failure with preserved ejection fraction ([place])    a. TTE 01/2014: nl LV sys fxn, no valvular abnormalities; b. TTE 11/16: nl EF, mild LVH;  c. 04/2019 Echo: EF 60-65%. DD. Nl RV fxn. Mod AS. 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  . Allergy   . Anemia of chronic disease   . Anxiety   . Aortic atherosclerosis ([place])   . Asthma   . Chronic back pain   . COPD (chronic obstructive pulmonary disease) ([place])   . Diabetes mellitus with complication ([place])   . ESRD on hemodialysis ([place])    a. Tues/Sat; b. 2/2 small kidneys  . Essential hypertension   . Fistula    lower left arm  . GERD (gastroesophageal reflux disease)   . Gout   . History of exercise stress test    a. 01/2014: no evidence of ischemia; b. Lexiscan 08/2015: no sig ischemia, severe GI uptake artifact, low risk; c. CPET @ [facility] 09/2016: exercised 3 min 12 sec on bike without incline, 2.28 METs, VO2 of 8.1, 48%  of predicted, indicating mod to sev functional impairment, evidence of blunted HR, stroke volume, and BP augmentation as well as ventilation-perfusion mismatch with exercise  . HLD (hyperlipidemia)   . Non-obstructive Carotid arterial disease ([place])    a. 12/2017: <50% bilat ICA dzs.  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 Severe aortic stenosis    a. by 09/2019 echo b. 04/2019 Echo: Mod AS.  Marland Kitchen Sleep apnea     Patient Active Problem List   Diagnosis Date Noted  . Dyspnea 04/20/2020  . Acute on chronic respiratory failure with hypoxemia ([place]) 03/29/2020  . Asymptomatic bacteriuria 12/05/2019  . Acute respiratory failure ([place]) 10/23/2019  . COPD with acute exacerbation ([place]) 10/23/2019  . Elevated troponin 10/23/2019  . CAD (coronary artery disease) 10/23/2019  . COPD exacerbation ([place]) 10/23/2019  . Leukocytosis 10/23/2019  . Anxiety 10/23/2019  . Palliative care by specialist   . Goals of care, counseling/discussion   . Flash pulmonary edema ([place]) 10/14/2019  . Respiratory failure with hypoxia ([place]) 10/11/2019  . 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-stage renal disease) ([place])   . Acute on chronic respiratory failure with hypoxia ([place]) 10/01/2019  . Severe aortic valve stenosis   . Acute respiratory failure with hypoxemia ([place]) 09/20/2019  . COPD with acute bronchitis ([place]) 09/05/2019  . Congestive heart failure ([place]) 05/24/2019  . Hyperkalemia 11/29/2018  . Chronic diastolic heart failure ([place]) 11/02/2018  .  HTN (hypertension) 11/02/2018  . Uncontrolled hypertension 10/21/2018  . History of acute pulmonary edema 10/21/2018  . Pressure injury of skin 03/16/2018  . CVA (cerebral vascular accident) ([place]) 01/16/2018  . Aortic atherosclerosis ([place]) 12/11/2017  . Chest pain 09/18/2017  . Hyperlipidemia 08/06/2015  . Complication from renal dialysis device 04/12/2015  . SOB (shortness of breath) 02/01/2015  . ESRD (end stage renal disease) on dialysis ([place]) 02/01/2015  .  Type 2 diabetes mellitus with other specified complication ([place]) 97/98/9211  . Asthma 02/01/2015  . Acute on chronic diastolic CHF (congestive heart failure) ([place]) 02/01/2015    Past Surgical History:  Procedure Laterality Date  . CARDIAC CATHETERIZATION    . carpel tunnel    . GALLBLADDER SURGERY    . PERIPHERAL VASCULAR CATHETERIZATION N/A 04/12/2015   Procedure: A/V Shuntogram/Fistulagram;  Surgeon: Algernon Huxley, MD;  Location: [facility];  Service: Cardiovascular;  Laterality: N/A;  . PERIPHERAL VASCULAR CATHETERIZATION N/A 04/12/2015   Procedure: A/V Shunt Intervention;  Surgeon: Algernon Huxley, MD;  Location: [facility];  Service: Cardiovascular;  Laterality: N/A;  . PERIPHERAL VASCULAR CATHETERIZATION N/A 06/09/2015   Procedure: Dialysis/Perma Catheter Removal;  Surgeon: Katha Cabal, MD;  Location: [facility];  Service: Cardiovascular;  Laterality: N/A;  . RIGHT/LEFT HEART CATH AND CORONARY ANGIOGRAPHY N/A 10/14/2019   Procedure: RIGHT/LEFT HEART CATH AND CORONARY ANGIOGRAPHY;  Surgeon: Belva Crome, MD;  Location: [facility];  Service: Cardiovascular;  Laterality: N/A;    Prior to Admission medications   Medication Sig Start Date End Date Taking? Authorizing Provider  acetaminophen (TYLENOL) 500 MG tablet Take 1 tablet (500 mg total) by mouth every 6 (six) hours as needed for mild pain or fever. 09/29/19   Danford, Suann Larry, MD  albuterol (PROVENTIL HFA;VENTOLIN HFA) 108 (90 Base) MCG/ACT inhaler Inhale 2 puffs into the lungs every 6 (six) hours as needed for wheezing or shortness of breath. Out of medication    [provider]  amLODipine (NORVASC) 10 MG tablet Take 10 mg by mouth daily.  12/29/13   [provider]  aspirin (ASPIRIN 81) 81 MG EC tablet Take 1 tablet by mouth daily.  01/27/20   [provider]  benzonatate (TESSALON) 100 MG capsule Take 100 mg by mouth 3 (three) times daily. 04/14/20   [provider]  calcium acetate (PHOSLO) 667 MG capsule Take 2 capsules by mouth in the morning, at noon, and at bedtime. 12/31/19   [provider]  carvedilol (COREG) 12.5 MG tablet Take 12.5 mg by mouth 2 (two) times daily with a meal.  01/09/14   [provider]  cetirizine (ZYRTEC) 10 MG tablet Take 10 mg by mouth daily as needed for allergies.     [provider]  clopidogrel (PLAVIX) 75 MG tablet Take 1 tablet (75 mg total) by mouth daily. 01/18/18   Epifanio Lesches, MD  [facility] MELATONIN 5 MG CHEW Chew 1 tablet by mouth at bedtime. 02/02/20   [provider]  epoetin alfa (EPOGEN) 10000 UNIT/ML injection Inject 0.4 mLs (4,000 Units total) into the vein Every Tuesday,Thursday,and Saturday with dialysis. 04/03/20   Nolberto Hanlon, MD  furosemide (LASIX) 80 MG tablet Take 1 tablet (80 mg total) by mouth daily. 04/02/20 04/02/21  Nolberto Hanlon, MD  hydrALAZINE (APRESOLINE) 50 MG tablet TAKE 1 TABLET BY MOUTH EVERY 8 HOURS Patient taking differently: Take 50 mg by mouth 3 (three) times daily.  04/02/20   Gollan,  Kathlene November, MD  HYDROcodone-acetaminophen (NORCO) 7.5-325 MG tablet Take 1 tablet by mouth every 6 (six) hours as needed (pain). Patient taking differently: Take 1-2 tablets by mouth daily as needed (pain).  10/08/19   Danford, Suann Larry, MD  ipratropium-albuterol (DUONEB) 0.5-2.5 (3) MG/3ML SOLN Take 3 mLs by nebulization every 6 (six) hours as needed. 10/17/19   [facility], Tami Lin, PA  iron polysaccharides (NIFEREX) 150 MG capsule Take 150 mg by mouth daily. 01/09/20 01/08/21  [provider]  lidocaine (LIDODERM) 5 % Place 1 patch onto the skin daily. Remove & Discard patch within 12 hours or as directed by MD 10/14/19   Flora Lipps, MD  lidocaine-prilocaine (EMLA) cream Apply 1 application topically as needed (prior to treatment).     [provider]  losartan (COZAAR) 50 MG tablet Take 50 mg by mouth daily. 03/18/19   [provider]  mirtazapine (REMERON) 15 MG tablet Take 15 mg by mouth at bedtime. 02/25/20   [provider]  mometasone (NASONEX) 50 MCG/ACT nasal spray Place 2 sprays into the nose daily as needed (allergies).  01/10/20   [provider]  oxybutynin (DITROPAN XL) 15 MG 24 hr tablet Take 15 mg by mouth daily. 12/24/17   [provider]  pravastatin (PRAVACHOL) 40 MG tablet Take 40 mg by mouth at bedtime.     [provider]  sertraline (ZOLOFT) 50 MG tablet Take 50 mg by mouth at bedtime. 03/02/20   [provider]  topiramate (TOPAMAX) 25 MG tablet Take 1 tablet (25 mg total) by mouth as needed (for headaches). Patient not taking: Reported on 04/20/2020 09/07/19   Henreitta Leber, MD    Allergies  Allergen Reactions  . Enalapril Maleate Other (See Comments)    Other reaction(s): Headache  . Nitrofurantoin Swelling and Rash    Other Reaction: swelling of body  . Sulfamethoxazole-Trimethoprim Swelling  . 2,4-D Dimethylamine (Amisol) Rash and Other (See Comments)    Other Reaction: h/a  . Baclofen Other (See Comments) and Nausea Only    lightheadness ,drowsiness , muscle weakness , twitching in hands   . Neosporin [Neomycin-Bacitracin Zn-Polymyx] Other (See Comments) and Rash    Other Reaction: irritation Skin irritation   . Quinine Nausea And Vomiting, Rash and Other (See Comments)    Other Reaction: Vomiting, rash, h/a, vision  . Ultram [Tramadol] Palpitations  . Zocor [Simvastatin] Other (See Comments) and Rash    Other Reaction: muscle spasms Muscle pain and spasms  . Bactrim [Sulfamethoxazole-Trimethoprim] Swelling  . Levodopa Other (See Comments)    Reaction: unknown  . Macrodantin [Nitrofurantoin Macrocrystal] Swelling  . Quinine Derivatives Other (See Comments)    Vertigo,nausea vomiting blurred vision headache ears sensitivity     Family History  Problem Relation Age of Onset  . Hypertension Mother   . Hyperlipidemia Mother   . Heart  disease Father   . Heart attack Father 42  . Hypertension Father   . Hyperlipidemia Father   . Heart disease Brother        CABG   . Heart attack Brother   . Breast cancer Neg Hx     Social History Social History   Tobacco Use  . Smoking status: Never Smoker  . Smokeless tobacco: Never Used  Substance Use Topics  . Alcohol use: No  . Drug use: No    Review of Systems Constitutional: Negative for fever. Cardiovascular: Negative for chest pain. Respiratory: Positive for shortness of breath. Gastrointestinal: Negative for  abdominal pain Musculoskeletal: Negative for leg swelling. Neurological: Negative for headache All other ROS negative  ____________________________________________   PHYSICAL EXAM:  VITAL SIGNS: ED Triage Vitals  Enc Vitals Group     BP 04/27/20 1914 (!) 153/66     Pulse Rate 04/27/20 1914 84     Resp 04/27/20 1914 20     Temp --      Temp src --      SpO2 04/27/20 1914 100 %     Weight 04/27/20 1915 123 lb 14.4 oz (56.2 kg)     Height 04/27/20 1915 4\' 10"  (1.473 m)     Head Circumference --      Peak Flow --      Pain Score 04/27/20 1915 0     Pain Loc --      Pain Edu? --      Excl. in [place]? --    Constitutional: Alert and oriented. Currently on CPAP upon arrival, no acute distress. Eyes: Normal exam ENT      Head: Normocephalic and atraumatic.      Mouth/Throat: Mucous membranes are moist. Cardiovascular: Normal rate, regular rhythm.  Respiratory: Mild tachypnea currently on CPAP, mild rhonchi bilaterally. Gastrointestinal: Soft and nontender. No distention. Musculoskeletal: Nontender with normal range of motion in all extremities. No lower extremity tenderness or edema. Neurologic:  Normal speech and language. No gross focal neurologic deficits Skin:  Skin is warm, dry and intact.  Psychiatric: Mood and affect are normal.  ____________________________________________    EKG  EKG viewed and interpreted by myself shows a normal  sinus rhythm 84 bpm with a narrow QRS, normal axis, normal intervals, nonspecific ST changes.  ____________________________________________    RADIOLOGY  Chest x-ray shows no acute abnormality.  ____________________________________________   INITIAL IMPRESSION / ASSESSMENT AND PLAN / ED COURSE  Pertinent labs & imaging results that were available during my care of the patient were reviewed by me and considered in my medical decision making (see chart for details).   Patient presents to the emergency department via emergency traffic for acute onset of dyspnea found to be hypoxic 84% on her home CPAP machine. Patient has no baseline O2 requirement. Patient transition from CPAP to BiPAP upon arrival to the emergency department. Blood pressure is slightly hypertensive but not significantly currently 153/66. Patient does have mild rhonchi bilaterally on lung auscultation. We will check labs, chest x-ray and continue to closely monitor. Differential would include fluid overload, CHF/pulmonary edema, pneumonia, pneumothorax, ACS.  X-ray shows no acute abnormality.  Patient is doing much better on BiPAP.  Troponin slightly elevated at 64 however patient is end-stage renal, BNP elevated 556 but again end-stage renal.  Lab work largely unchanged otherwise including anemia.  Patient will be admitted to the hospital service for further treatment.  Shaira Sova Lienau was evaluated in Emergency Department on 04/27/2020 for the symptoms described in the history of present illness. She was evaluated in the context of the global COVID-19 pandemic, which necessitated consideration that the patient might be at risk for infection with the SARS-CoV-2 virus that causes COVID-19. Institutional protocols and algorithms that pertain to the evaluation of patients at risk for COVID-19 are in a state of rapid change based on information released by regulatory bodies including the CDC and federal and state organizations.  These policies and algorithms were followed during the patient's care in the ED.  CRITICAL CARE Performed by: Harvest Dark   Total critical care time: 30 minutes  Critical care time  was exclusive of separately billable procedures and treating other patients.  Critical care was necessary to treat or prevent imminent or life-threatening deterioration.  Critical care was time spent personally by me on the following activities: development of treatment plan with patient and/or surrogate as well as nursing, discussions with consultants, evaluation of patient's response to treatment, examination of patient, obtaining history from patient or surrogate, ordering and performing treatments and interventions, ordering and review of laboratory studies, ordering and review of radiographic studies, pulse oximetry and re-evaluation of patient's condition.  ____________________________________________   FINAL CLINICAL IMPRESSION(S) / ED DIAGNOSES  Dyspnea CHF exacerbation   Harvest Dark, MD 04/27/20 2021

## 2020-04-28 ENCOUNTER — Ambulatory Visit: Payer: Medicare Other | Admitting: Family

## 2020-04-28 ENCOUNTER — Inpatient Hospital Stay: Payer: Medicare Other

## 2020-04-28 ENCOUNTER — Encounter: Payer: Self-pay | Admitting: Internal Medicine

## 2020-04-28 DIAGNOSIS — J9601 Acute respiratory failure with hypoxia: Secondary | ICD-10-CM

## 2020-04-28 LAB — BASIC METABOLIC PANEL
Anion gap: 11 (ref 5–15)
BUN: 42 mg/dL — ABNORMAL HIGH (ref 8–23)
CO2: 25 mmol/L (ref 22–32)
Calcium: 7.7 mg/dL — ABNORMAL LOW (ref 8.9–10.3)
Chloride: 104 mmol/L (ref 98–111)
Creatinine, Ser: 4.31 mg/dL — ABNORMAL HIGH (ref 0.44–1.00)
GFR calc Af Amer: 11 mL/min — ABNORMAL LOW (ref >60)
GFR calc non Af Amer: 10 mL/min — ABNORMAL LOW (ref >60)
Glucose, Bld: 160 mg/dL — ABNORMAL HIGH (ref 70–99)
Potassium: 4.3 mmol/L (ref 3.5–5.1)
Sodium: 140 mmol/L (ref 135–145)

## 2020-04-28 LAB — CBC WITH DIFFERENTIAL/PLATELET
Abs Immature Granulocytes: 0.03 10*3/uL (ref 0.00–0.07)
Basophils Absolute: 0 10*3/uL (ref 0.0–0.1)
Basophils Relative: 0 %
Eosinophils Absolute: 0 10*3/uL (ref 0.0–0.5)
Eosinophils Relative: 0 %
HCT: 20.5 % — ABNORMAL LOW (ref 36.0–46.0)
Hemoglobin: 7 g/dL — ABNORMAL LOW (ref 12.0–15.0)
Immature Granulocytes: 1 %
Lymphocytes Relative: 9 %
Lymphs Abs: 0.5 10*3/uL — ABNORMAL LOW (ref 0.7–4.0)
MCH: 30.4 pg (ref 26.0–34.0)
MCHC: 34.1 g/dL (ref 30.0–36.0)
MCV: 89.1 fL (ref 80.0–100.0)
Monocytes Absolute: 0.1 10*3/uL (ref 0.1–1.0)
Monocytes Relative: 2 %
Neutro Abs: 4.3 10*3/uL (ref 1.7–7.7)
Neutrophils Relative %: 88 %
Platelets: 121 10*3/uL — ABNORMAL LOW (ref 150–400)
RBC: 2.3 MIL/uL — ABNORMAL LOW (ref 3.87–5.11)
RDW: 13.6 % (ref 11.5–15.5)
WBC: 4.9 10*3/uL (ref 4.0–10.5)
nRBC: 0 % (ref 0.0–0.2)

## 2020-04-28 LAB — RENAL FUNCTION PANEL
Albumin: 3.4 g/dL — ABNORMAL LOW (ref 3.5–5.0)
Anion gap: 12 (ref 5–15)
BUN: 53 mg/dL — ABNORMAL HIGH (ref 8–23)
CO2: 29 mmol/L (ref 22–32)
Calcium: 8.4 mg/dL — ABNORMAL LOW (ref 8.9–10.3)
Chloride: 98 mmol/L (ref 98–111)
Creatinine, Ser: 5.61 mg/dL — ABNORMAL HIGH (ref 0.44–1.00)
GFR calc Af Amer: 8 mL/min — ABNORMAL LOW (ref >60)
GFR calc non Af Amer: 7 mL/min — ABNORMAL LOW (ref >60)
Glucose, Bld: 154 mg/dL — ABNORMAL HIGH (ref 70–99)
Phosphorus: 3.4 mg/dL (ref 2.5–4.6)
Potassium: 4.2 mmol/L (ref 3.5–5.1)
Sodium: 139 mmol/L (ref 135–145)

## 2020-04-28 MED ORDER — LIDOCAINE 5 % EX PTCH
1.0000 | MEDICATED_PATCH | CUTANEOUS | Status: DC
  Administered 2020-04-28 – 2020-04-30 (×3): 1 via TRANSDERMAL
  Filled 2020-04-28 (×3): qty 1

## 2020-04-28 MED ORDER — FENTANYL CITRATE (PF) 100 MCG/2ML IJ SOLN
12.5000 ug | Freq: Once | INTRAMUSCULAR | Status: AC
  Administered 2020-04-28: 12.5 ug via INTRAVENOUS
  Filled 2020-04-28: qty 2

## 2020-04-28 MED ORDER — IOHEXOL 350 MG/ML SOLN
75.0000 mL | Freq: Once | INTRAVENOUS | Status: AC | PRN
  Administered 2020-04-28: 75 mL via INTRAVENOUS

## 2020-04-28 NOTE — Plan of Care (Signed)
  Problem: Education: Goal: Knowledge of General Education information will improve Description: Including pain rating scale, medication(s)/side effects and non-pharmacologic comfort measures Outcome: Progressing   Problem: Health Behavior/Discharge Planning: Goal: Ability to manage health-related needs will improve Outcome: Progressing   Problem: Clinical Measurements: Goal: Ability to maintain clinical measurements within normal limits will improve Outcome: Not Progressing Note: Patient's hemoglobin is very low at only 7. Will continue to monitor lab values for the remainder of the shift. Wenda Low [facility]

## 2020-04-28 NOTE — Progress Notes (Signed)
Established hemodialysis patient known at [facility] [address]) TTS 11:45. Patient lives at home with round the clock caregivers, who also transport patient to dialysis. Please contact me with any dialysis placement concerns.   Elvera Bicker Dialysis Coordinator [phone]

## 2020-04-28 NOTE — Progress Notes (Signed)
[facility]  ROUNDING NOTE   Subjective:   [name] admitted to [facility] on 04/27/2020 for Acute respiratory failure ([place]) [J96.00] Acute on chronic diastolic (congestive) heart failure ([place]) [I50.33]  Patient completed her dialysis treatment yesterday. After coming home, developed shortness of breath. Placed herself on home CPAP (which she has not used in several years). Brought to the ED with chest pain.   Patient now on nasal canula and has no complaints. Does not want dialysis today.    Objective:  Vital signs in last 24 hours:  Temp:  [98.2 F (36.8 C)] 98.2 F (36.8 C) (06/08 1918) Pulse Rate:  [62-84] 64 (06/09 0930) Resp:  [13-25] 17 (06/09 0930) BP: (119-156)/(40-99) 154/47 (06/09 0930) SpO2:  [100 %] 100 % (06/09 0930) Weight:  [56.2 kg] 56.2 kg (06/08 1915)  Weight change:  Filed Weights   04/27/20 1915  Weight: 56.2 kg    Intake/Output: I/O last 3 completed shifts: In: 3 [I.V.:3] Out: -    Intake/Output this shift:  No intake/output data recorded.  Physical Exam: General: No acute distress  Head: Normocephalic, atraumatic. Moist oral mucosal membranes  Eyes: Anicteric  Neck: Supple, trachea midline  Lungs:  Clear to auscultation, normal effort, 2L [place] O2  Heart: regular, 2/6 systolic ejection murmur  Abdomen:  Soft, nontender, bowel sounds present  Extremities: No peripheral edema.  Neurologic: Awake, alert, following commands  Skin: No lesions  Access: Left upper extremity AV fistula    Basic Metabolic Panel: Recent Labs  Lab 04/22/20 0505 04/27/20 1918 04/28/20 0457  NA 136 140 140  K 5.0 4.0 4.3  CL 96* 100 104  CO2 29 29 25   GLUCOSE 78 154* 160*  BUN 45* 34* 42*  CREATININE 6.17* 3.99* 4.31*  CALCIUM 8.4* 8.3* 7.7*    Liver Function Tests: Recent Labs  Lab 04/27/20 1918  AST 18  ALT 13  ALKPHOS 101  BILITOT 0.5  PROT 6.3*  ALBUMIN 3.9   No results for input(s): LIPASE, AMYLASE in the last 168  hours. No results for input(s): AMMONIA in the last 168 hours.  CBC: Recent Labs  Lab 04/22/20 0505 04/27/20 1918 04/28/20 0457  WBC 5.5 7.3 4.9  NEUTROABS  --   --  4.3  HGB 7.2* 8.0* 7.0*  HCT 22.6* 23.5* 20.5*  MCV 92.6 88.7 89.1  PLT 143* 149* 121*    Cardiac Enzymes: No results for input(s): CKTOTAL, CKMB, CKMBINDEX, TROPONINI in the last 168 hours.  BNP: Invalid input(s): POCBNP  CBG: Recent Labs  Lab 04/21/20 1548 04/21/20 1744 04/21/20 2236 04/22/20 0751  GLUCAP 113* 163* 103* 78    Microbiology: Results for orders placed or performed during the hospital encounter of 04/27/20  SARS Coronavirus 2 by RT PCR (hospital order, performed in [facility] hospital lab) Nasopharyngeal Nasopharyngeal Swab     Status: None   Collection Time: 04/27/20  8:30 PM   Specimen: Nasopharyngeal Swab  Result Value Ref Range Status   SARS Coronavirus 2 NEGATIVE NEGATIVE Final    Comment: (NOTE) SARS-CoV-2 target nucleic acids are NOT DETECTED. The SARS-CoV-2 RNA is generally detectable in upper and lower respiratory specimens during the acute phase of infection. The lowest concentration of SARS-CoV-2 viral copies this assay can detect is 250 copies / mL. A negative result does not preclude SARS-CoV-2 infection and should not be used as the sole basis for treatment or other patient management decisions.  A negative result may occur with improper specimen collection /  handling, submission of specimen other than nasopharyngeal swab, presence of viral mutation(s) within the areas targeted by this assay, and inadequate number of viral copies (<250 copies / mL). A negative result must be combined with clinical observations, patient history, and epidemiological information. Fact Sheet for Patients:   StrictlyIdeas.no Fact Sheet for Healthcare Providers: BankingDealers.co.za This test is not yet approved or cleared  by the [place]  FDA and has been authorized for detection and/or diagnosis of SARS-CoV-2 by FDA under an Emergency Use Authorization (EUA).  This EUA will remain in effect (meaning this test can be used) for the duration of the COVID-19 declaration under Section 564(b)(1) of the Act, 21 U.S.C. section 360bbb-3(b)(1), unless the authorization is terminated or revoked sooner. Performed at [facility], [address] [place], [place] [address]     Coagulation Studies: No results for input(s): LABPROT, INR in the last 72 hours.  Urinalysis: No results for input(s): COLORURINE, LABSPEC, PHURINE, GLUCOSEU, HGBUR, BILIRUBINUR, KETONESUR, PROTEINUR, UROBILINOGEN, NITRITE, LEUKOCYTESUR in the last 72 hours.  Invalid input(s): APPERANCEUR    Imaging: DG Chest Portable 1 View  Result Date: 04/27/2020 CLINICAL DATA:  74 year old female with history of shortness of breath. EXAM: PORTABLE CHEST 1 VIEW COMPARISON:  Chest x-ray 04/19/2020. FINDINGS: Lung volumes are low. No consolidative airspace disease. No pleural effusions. No pneumothorax. No pulmonary nodule or mass noted. Pulmonary vasculature and the cardiomediastinal silhouette are within normal limits. Atherosclerosis in the thoracic aorta. IMPRESSION: 1. Low lung volumes without radiographic evidence of acute cardiopulmonary disease. 2. Aortic atherosclerosis. Electronically Signed   By: Vinnie Langton M.D.   On: 04/27/2020 19:35     Medications:   . sodium chloride     . amLODipine  10 mg Oral Daily  . aspirin EC  81 mg Oral Daily  . benzonatate  100 mg Oral TID  . calcium acetate  1,334 mg Oral TID WC  . carvedilol  12.5 mg Oral BID WC  . clopidogrel  75 mg Oral Daily  . furosemide  60 mg Intravenous Q12H  . heparin  5,000 Units Subcutaneous Q8H  . hydrALAZINE  50 mg Oral TID  . iron polysaccharides  150 mg Oral Daily  . loratadine  10 mg Oral Daily  . losartan  50 mg Oral Daily  . melatonin  5 mg Oral QHS  . mirtazapine  15 mg  Oral QHS  . oxybutynin  15 mg Oral Daily  . pravastatin  40 mg Oral q1800  . sertraline  50 mg Oral QHS  . sodium chloride flush  3 mL Intravenous Q12H   sodium chloride, acetaminophen, albuterol, ALPRAZolam, fluticasone, HYDROcodone-acetaminophen, ipratropium-albuterol, ondansetron (ZOFRAN) IV, sodium chloride flush, zolpidem  Assessment/ Plan:   Phyllis [name] is a 74 y.o. white female with end stage renal disease on hemodialysis, aortic stenosis, hypertension, COPD, diabetes mellitus type 2, overactive bladder, gout, GERD, chronic costochondritis admitted on 04/27/2020 to [facility] for Acute respiratory failure ([place]) [J96.00] Acute on chronic diastolic (congestive) heart failure ([place]) [I50.33]   [facility] TTS [facility] [address] Left AV fistula 56kg  1.  ESRD on HD TTS.  Completed hemodialysis treatment yesterday. Close to estimated dry weight. No indication for dialysis today.  Scheduled dialysis for tomorrow.   2.  Anemia of chronic kidney disease.  Hemoglobin 7 - EPO with HD treatment  3. Hypertension: 154/47. Home regimen of carvedilol, amlodipine, furosemide, hydralazine, losartan.   4.  Secondary hyperparathyroidism - Calcium acetate with meals.    LOS:  1 Audra Kagel 6/9/20219:44 AM

## 2020-04-28 NOTE — ED Notes (Signed)
Provider stated due to pts history of dialysis and minimal urine output regularly, no need for intervention at this time.

## 2020-04-28 NOTE — ED Notes (Signed)
Receiving nurse called, reports has reviewed patient chart and orders, did not have any questions. Ok to bring patient up to unit.

## 2020-04-28 NOTE — ED Notes (Signed)
Assumed care of patient aox3, denies any resp concerns this morning. Patient on 2lnc sating 100%. Patient reports does not use o2 at home. tolerating [place] well. Lungs clear upon auscultation. Home health aide at bedside. Vss. Patient awaiting bed status, safety maintained. Call light w/i reach.

## 2020-04-28 NOTE — ED Notes (Signed)
Provider notified that pt has not urinated since lasix given.

## 2020-04-28 NOTE — ED Notes (Signed)
When pt first came in pt had urinated in her depends. As per pt she does make urine, but it is very little. Pt is a dialysis pt (T/R/S)

## 2020-04-28 NOTE — Plan of Care (Signed)

## 2020-04-28 NOTE — Progress Notes (Addendum)
PROGRESS NOTE    Phyllis [name]  YIR:[id number] DOB: 08-25-1946 DOA: 04/27/2020 PCP: [name], MD   Chief Complaint  Patient presents with  . Shortness of Breath    Brief Narrative:  74 year old female with end-stage renal disease on hemodialysis (Tuesdays, Thursdays and Saturdays), diastolic CHF, asthma, COPD, type 2 diabetes mellitus, hypertension presented with worsening shortness of breath following hemodialysis on the day of admission.  Patient reported having wheezing and orthopnea associated with cough.  Reports this is her third presentation for similar symptoms in the past 1 month.  Found to be acute hypoxic respiratory failure with O2 sat of 82% requiring CPAP and then transition to BiPAP. Blood work in the ED showed BUN of 34 and creatinine 3.99, BNP of 556.  COVID-19 PCR negative.  Chest x-ray showing low lung volumes without acute cardiopulmonary disease. Admitted for further management.  Assessment & Plan:   Principal problem Acute respiratory failure with hypoxia (HCC)  Secondary to acute on chronic diastolic CHF and moderate to severe AS.  Complaint of right-sided chest discomfort this afternoon.  Patient reports that she has been getting increased fluid removal during dialysis on last few occasion.  Was removed 600 cc more during HD yesterday as well. Improved on BiPAP and now transitioned to 3 L via nasal cannula. Does make some urine and is on 80 mg p.o. Lasix at home.  Placed on 60 mg IV twice daily on admission. Strict I/Os and daily weight. Given recurrent symptoms in the past 10 days and right-sided chest pain.  I will obtain CT angiogram of the chest to rule out acute PE.  Nephrology following, recommend she appears to be at her dry weight and will dialyze her tomorrow.  Active problems Essential hypertension Continue home meds (amlodipine, hydralazine, Cozaar and Coreg).  Right-sided chest pain We will resume her Lidoderm patch without much  relief.  Getting as needed Norco.  Will order low-dose fentanyl.  Check CT angiogram of the chest rule out PE.  Anemia of chronic kidney disease Epo with HD  Chronic depression:  Continue Zoloft  Dyslipidemia Continue statin    DVT prophylaxis: Subcu Lovenox Code Status: Full code Family Communication: Caregiver at bedside Disposition:   Status is: Inpatient  Remains inpatient appropriate because:Hemodynamically unstable.  Possibly discharge home tomorrow if respiratory function improves with IV diuresis and hemodialysis tomorrow.  Dispo: The patient is from: Home              Anticipated d/c is to: Home              Anticipated d/c date is: 1 day              Patient currently is not medically stable to d/c.       Consultants:   Renal   Procedures: None  Antimicrobials: None    Subjective: Transitioned to 2L [place]. Feels better from earlier. Reports getting 600 cc more fluids out with HD yday. Taking lasix 80 mg at home. Not on home o2. Care giver at bedside  Objective: Vitals:   04/28/20 0600 04/28/20 0630 04/28/20 0730 04/28/20 0735  BP: (!) 120/44 (!) 129/41 (!) 141/41   Pulse: 70 70 66   Resp: 13 18 (!) 24   Temp:      SpO2: 100% 100% 100% 100%  Weight:      Height:        Intake/Output Summary (Last 24 hours) at 04/28/2020 5009 Last data filed at 04/28/2020 0109 Gross  per 24 hour  Intake 3 ml  Output --  Net 3 ml   Filed Weights   04/27/20 1915  Weight: 56.2 kg    Examination:  General: not in distress, fatigued  HEENT: moist mucosa, supple neck  chest: diminished breath sounds at lung bases Cvs: NW2&N5, 3/6 systolic murmur GI: soft, NT, ND Musculoskeletal: warm, no edema rt forearm AV graft    Data Reviewed: I have personally reviewed following labs and imaging studies  CBC: Recent Labs  Lab 04/22/20 0505 04/27/20 1918 04/28/20 0457  WBC 5.5 7.3 4.9  NEUTROABS  --   --  4.3  HGB 7.2* 8.0* 7.0*  HCT 22.6* 23.5* 20.5*  MCV 92.6  88.7 89.1  PLT 143* 149* 121*    Basic Metabolic Panel: Recent Labs  Lab 04/22/20 0505 04/27/20 1918 04/28/20 0457  NA 136 140 140  K 5.0 4.0 4.3  CL 96* 100 104  CO2 29 29 25   GLUCOSE 78 154* 160*  BUN 45* 34* 42*  CREATININE 6.17* 3.99* 4.31*  CALCIUM 8.4* 8.3* 7.7*    GFR: Estimated Creatinine Clearance: 8.6 mL/min (A) (by C-G formula based on SCr of 4.31 mg/dL (H)).  Liver Function Tests: Recent Labs  Lab 04/27/20 1918  AST 18  ALT 13  ALKPHOS 101  BILITOT 0.5  PROT 6.3*  ALBUMIN 3.9    CBG: Recent Labs  Lab 04/21/20 1548 04/21/20 1744 04/21/20 2236 04/22/20 0751  GLUCAP 113* 163* 103* 78     Recent Results (from the past 240 hour(s))  SARS Coronavirus 2 by RT PCR (hospital order, performed in [facility] hospital lab) Nasopharyngeal Nasopharyngeal Swab     Status: None   Collection Time: 04/20/20 12:00 AM   Specimen: Nasopharyngeal Swab  Result Value Ref Range Status   SARS Coronavirus 2 NEGATIVE NEGATIVE Final    Comment: (NOTE) SARS-CoV-2 target nucleic acids are NOT DETECTED. The SARS-CoV-2 RNA is generally detectable in upper and lower respiratory specimens during the acute phase of infection. The lowest concentration of SARS-CoV-2 viral copies this assay can detect is 250 copies / mL. A negative result does not preclude SARS-CoV-2 infection and should not be used as the sole basis for treatment or other patient management decisions.  A negative result may occur with improper specimen collection / handling, submission of specimen other than nasopharyngeal swab, presence of viral mutation(s) within the areas targeted by this assay, and inadequate number of viral copies (<250 copies / mL). A negative result must be combined with clinical observations, patient history, and epidemiological information. Fact Sheet for Patients:   StrictlyIdeas.no Fact Sheet for Healthcare  Providers: BankingDealers.co.za This test is not yet approved or cleared  by the [place] FDA and has been authorized for detection and/or diagnosis of SARS-CoV-2 by FDA under an Emergency Use Authorization (EUA).  This EUA will remain in effect (meaning this test can be used) for the duration of the COVID-19 declaration under Section 564(b)(1) of the Act, 21 U.S.C. section 360bbb-3(b)(1), unless the authorization is terminated or revoked sooner. Performed at [facility], [address] [place], [place] [address]   MRSA PCR Screening     Status: None   Collection Time: 04/21/20  6:58 PM   Specimen: Nasal Mucosa; Nasopharyngeal  Result Value Ref Range Status   MRSA by PCR NEGATIVE NEGATIVE Final    Comment:        The GeneXpert MRSA Assay (FDA approved for NASAL specimens only), is one component of a  comprehensive MRSA colonization surveillance program. It is not intended to diagnose MRSA infection nor to guide or monitor treatment for MRSA infections. Performed at [facility], [address] [place], [place] [address]   SARS Coronavirus 2 by RT PCR (hospital order, performed in [facility] hospital lab) Nasopharyngeal Nasopharyngeal Swab     Status: None   Collection Time: 04/27/20  8:30 PM   Specimen: Nasopharyngeal Swab  Result Value Ref Range Status   SARS Coronavirus 2 NEGATIVE NEGATIVE Final    Comment: (NOTE) SARS-CoV-2 target nucleic acids are NOT DETECTED. The SARS-CoV-2 RNA is generally detectable in upper and lower respiratory specimens during the acute phase of infection. The lowest concentration of SARS-CoV-2 viral copies this assay can detect is 250 copies / mL. A negative result does not preclude SARS-CoV-2 infection and should not be used as the sole basis for treatment or other patient management decisions.  A negative result may occur with improper specimen collection / handling, submission of specimen  other than nasopharyngeal swab, presence of viral mutation(s) within the areas targeted by this assay, and inadequate number of viral copies (<250 copies / mL). A negative result must be combined with clinical observations, patient history, and epidemiological information. Fact Sheet for Patients:   StrictlyIdeas.no Fact Sheet for Healthcare Providers: BankingDealers.co.za This test is not yet approved or cleared  by the [place] FDA and has been authorized for detection and/or diagnosis of SARS-CoV-2 by FDA under an Emergency Use Authorization (EUA).  This EUA will remain in effect (meaning this test can be used) for the duration of the COVID-19 declaration under Section 564(b)(1) of the Act, 21 U.S.C. section 360bbb-3(b)(1), unless the authorization is terminated or revoked sooner. Performed at [facility], [address], [place], [place] [address]          Radiology Studies: DG Chest Portable 1 View  Result Date: 04/27/2020 CLINICAL DATA:  74 year old female with history of shortness of breath. EXAM: PORTABLE CHEST 1 VIEW COMPARISON:  Chest x-ray 04/19/2020. FINDINGS: Lung volumes are low. No consolidative airspace disease. No pleural effusions. No pneumothorax. No pulmonary nodule or mass noted. Pulmonary vasculature and the cardiomediastinal silhouette are within normal limits. Atherosclerosis in the thoracic aorta. IMPRESSION: 1. Low lung volumes without radiographic evidence of acute cardiopulmonary disease. 2. Aortic atherosclerosis. Electronically Signed   By: Vinnie Langton M.D.   On: 04/27/2020 19:35        Scheduled Meds: . amLODipine  10 mg Oral Daily  . aspirin EC  81 mg Oral Daily  . benzonatate  100 mg Oral TID  . calcium acetate  1,334 mg Oral TID WC  . carvedilol  12.5 mg Oral BID WC  . clopidogrel  75 mg Oral Daily  . furosemide  60 mg Intravenous Q12H  . heparin  5,000 Units Subcutaneous Q8H   . hydrALAZINE  50 mg Oral TID  . iron polysaccharides  150 mg Oral Daily  . loratadine  10 mg Oral Daily  . losartan  50 mg Oral Daily  . melatonin  5 mg Oral QHS  . mirtazapine  15 mg Oral QHS  . oxybutynin  15 mg Oral Daily  . pravastatin  40 mg Oral q1800  . sertraline  50 mg Oral QHS  . sodium chloride flush  3 mL Intravenous Q12H   Continuous Infusions: . sodium chloride       LOS: 1 day    Time spent: 35 minutes    Jannine Abreu, MD  [facility]   To contact the attending provider between 7A-7P or the covering provider during after hours 7P-7A, please log into the web site [email or web address] and access using universal [place] password for that web site. If you do not have the password, please call the hospital operator.  04/28/2020, 9:07 AM

## 2020-04-29 DIAGNOSIS — D631 Anemia in chronic kidney disease: Secondary | ICD-10-CM

## 2020-04-29 DIAGNOSIS — N189 Chronic kidney disease, unspecified: Secondary | ICD-10-CM

## 2020-04-29 LAB — BASIC METABOLIC PANEL
Anion gap: 11 (ref 5–15)
BUN: 60 mg/dL — ABNORMAL HIGH (ref 8–23)
CO2: 28 mmol/L (ref 22–32)
Calcium: 8.2 mg/dL — ABNORMAL LOW (ref 8.9–10.3)
Chloride: 99 mmol/L (ref 98–111)
Creatinine, Ser: 6.57 mg/dL — ABNORMAL HIGH (ref 0.44–1.00)
GFR calc Af Amer: 7 mL/min — ABNORMAL LOW (ref >60)
GFR calc non Af Amer: 6 mL/min — ABNORMAL LOW (ref >60)
Glucose, Bld: 79 mg/dL (ref 70–99)
Potassium: 4.9 mmol/L (ref 3.5–5.1)
Sodium: 138 mmol/L (ref 135–145)

## 2020-04-29 LAB — CBC WITH DIFFERENTIAL/PLATELET
Abs Immature Granulocytes: 0.03 10*3/uL (ref 0.00–0.07)
Basophils Absolute: 0 10*3/uL (ref 0.0–0.1)
Basophils Relative: 0 %
Eosinophils Absolute: 0.1 10*3/uL (ref 0.0–0.5)
Eosinophils Relative: 2 %
HCT: 17.8 % — ABNORMAL LOW (ref 36.0–46.0)
Hemoglobin: 6 g/dL — ABNORMAL LOW (ref 12.0–15.0)
Immature Granulocytes: 1 %
Lymphocytes Relative: 22 %
Lymphs Abs: 1.2 10*3/uL (ref 0.7–4.0)
MCH: 30.6 pg (ref 26.0–34.0)
MCHC: 33.7 g/dL (ref 30.0–36.0)
MCV: 90.8 fL (ref 80.0–100.0)
Monocytes Absolute: 0.5 10*3/uL (ref 0.1–1.0)
Monocytes Relative: 8 %
Neutro Abs: 3.8 10*3/uL (ref 1.7–7.7)
Neutrophils Relative %: 67 %
Platelets: 108 10*3/uL — ABNORMAL LOW (ref 150–400)
RBC: 1.96 MIL/uL — ABNORMAL LOW (ref 3.87–5.11)
RDW: 14 % (ref 11.5–15.5)
WBC: 5.6 10*3/uL (ref 4.0–10.5)
nRBC: 0 % (ref 0.0–0.2)

## 2020-04-29 LAB — PREPARE RBC (CROSSMATCH)

## 2020-04-29 LAB — HEMOGLOBIN AND HEMATOCRIT, BLOOD
HCT: 20.4 % — ABNORMAL LOW (ref 36.0–46.0)
Hemoglobin: 6.7 g/dL — ABNORMAL LOW (ref 12.0–15.0)

## 2020-04-29 LAB — CBC
HCT: 24.2 % — ABNORMAL LOW (ref 36.0–46.0)
Hemoglobin: 7.9 g/dL — ABNORMAL LOW (ref 12.0–15.0)
MCH: 29.7 pg (ref 26.0–34.0)
MCHC: 32.6 g/dL (ref 30.0–36.0)
MCV: 91 fL (ref 80.0–100.0)
Platelets: 164 K/uL (ref 150–400)
RBC: 2.66 MIL/uL — ABNORMAL LOW (ref 3.87–5.11)
RDW: 13.8 % (ref 11.5–15.5)
WBC: 10.1 K/uL (ref 4.0–10.5)
nRBC: 0 % (ref 0.0–0.2)

## 2020-04-29 LAB — HEPATITIS B SURFACE ANTIGEN: Hepatitis B Surface Ag: NONREACTIVE

## 2020-04-29 MED ORDER — HYDROCODONE-ACETAMINOPHEN 7.5-325 MG PO TABS
1.0000 | ORAL_TABLET | Freq: Every day | ORAL | Status: DC | PRN
  Administered 2020-04-29: 2 via ORAL
  Filled 2020-04-29 (×2): qty 2

## 2020-04-29 MED ORDER — HYDROCODONE-ACETAMINOPHEN 7.5-325 MG PO TABS
1.0000 | ORAL_TABLET | Freq: Three times a day (TID) | ORAL | Status: DC | PRN
  Administered 2020-04-29: 2 via ORAL

## 2020-04-29 MED ORDER — EPOETIN ALFA 10000 UNIT/ML IJ SOLN
10000.0000 [IU] | INTRAMUSCULAR | Status: DC
  Administered 2020-04-30: 10000 [IU] via INTRAVENOUS

## 2020-04-29 MED ORDER — SODIUM CHLORIDE 0.9% IV SOLUTION: Freq: Once | INTRAVENOUS | Status: AC

## 2020-04-29 NOTE — Plan of Care (Signed)

## 2020-04-29 NOTE — Progress Notes (Signed)
Pre HD  

## 2020-04-29 NOTE — Progress Notes (Signed)
HD ended 

## 2020-04-29 NOTE — Progress Notes (Signed)
HD started. 

## 2020-04-29 NOTE — Progress Notes (Signed)
[facility]  ROUNDING NOTE   Subjective:   Seen and examined on hemodialysis treatment.     HEMODIALYSIS FLOWSHEET:  Blood Flow Rate (mL/min): 400 mL/min Arterial Pressure (mmHg): -220 mmHg Venous Pressure (mmHg): 180 mmHg Transmembrane Pressure (mmHg): 80 mmHg Ultrafiltration Rate (mL/min): 860 mL/min Dialysate Flow Rate (mL/min): 600 ml/min Conductivity: Machine : 14 Conductivity: Machine : 14 Dialysis Fluid Bolus: Normal Saline Bolus Amount (mL): 250 mL    Objective:  Vital signs in last 24 hours:  Temp:  [97.9 F (36.6 C)-98.5 F (36.9 C)] 98 F (36.7 C) (06/10 1314) Pulse Rate:  [52-83] 58 (06/10 1314) Resp:  [11-25] 17 (06/10 1234) BP: (83-145)/(33-61) 111/39 (06/10 1314) SpO2:  [99 %-100 %] 100 % (06/10 1314) Weight:  [56.4 kg-56.8 kg] 56.8 kg (06/10 1316)  Weight change: -0.544 kg Filed Weights   04/29/20 0334 04/29/20 0901 04/29/20 1316  Weight: 56.5 kg 56.4 kg 56.8 kg    Intake/Output: I/O last 3 completed shifts: In: 6 [I.V.:6] Out: 73 [Urine:50]   Intake/Output this shift:  Total I/O In: -  Out: 2500 [Other:2500]  Physical Exam: General: No acute distress  Head: Normocephalic, atraumatic. Moist oral mucosal membranes  Eyes: Anicteric  Neck: Supple, trachea midline  Lungs:  Clear to auscultation, normal effort, 2L [place] O2  Heart: regular, 2/6 systolic ejection murmur  Abdomen:  Soft, nontender, bowel sounds present  Extremities: No peripheral edema.  Neurologic: Awake, alert, following commands  Skin: No lesions  Access: Left upper extremity AV fistula    Basic Metabolic Panel: Recent Labs  Lab 04/27/20 1918 04/27/20 1918 04/28/20 0457 04/28/20 1622 04/29/20 0556  NA 140  --  140 139 138  K 4.0  --  4.3 4.2 4.9  CL 100  --  104 98 99  CO2 29  --  25 29 28   GLUCOSE 154*  --  160* 154* 79  BUN 34*  --  42* 53* 60*  CREATININE 3.99*  --  4.31* 5.61* 6.57*  CALCIUM 8.3*   < > 7.7* 8.4* 8.2*  PHOS  --   --   --  3.4  --     < > = values in this interval not displayed.    Liver Function Tests: Recent Labs  Lab 04/27/20 1918 04/28/20 1622  AST 18  --   ALT 13  --   ALKPHOS 101  --   BILITOT 0.5  --   PROT 6.3*  --   ALBUMIN 3.9 3.4*   No results for input(s): LIPASE, AMYLASE in the last 168 hours. No results for input(s): AMMONIA in the last 168 hours.  CBC: Recent Labs  Lab 04/27/20 1918 04/28/20 0457 04/29/20 0556 04/29/20 1230  WBC 7.3 4.9 5.6 10.1  NEUTROABS  --  4.3 3.8  --   HGB 8.0* 7.0* 6.0* 7.9*  HCT 23.5* 20.5* 17.8* 24.2*  MCV 88.7 89.1 90.8 91.0  PLT 149* 121* 108* 164    Cardiac Enzymes: No results for input(s): CKTOTAL, CKMB, CKMBINDEX, TROPONINI in the last 168 hours.  BNP: Invalid input(s): POCBNP  CBG: No results for input(s): GLUCAP in the last 168 hours.  Microbiology: Results for orders placed or performed during the hospital encounter of 04/27/20  SARS Coronavirus 2 by RT PCR (hospital order, performed in [facility] hospital lab) Nasopharyngeal Nasopharyngeal Swab     Status: None   Collection Time: 04/27/20  8:30 PM   Specimen: Nasopharyngeal Swab  Result Value Ref Range Status   SARS  Coronavirus 2 NEGATIVE NEGATIVE Final    Comment: (NOTE) SARS-CoV-2 target nucleic acids are NOT DETECTED. The SARS-CoV-2 RNA is generally detectable in upper and lower respiratory specimens during the acute phase of infection. The lowest concentration of SARS-CoV-2 viral copies this assay can detect is 250 copies / mL. A negative result does not preclude SARS-CoV-2 infection and should not be used as the sole basis for treatment or other patient management decisions.  A negative result may occur with improper specimen collection / handling, submission of specimen other than nasopharyngeal swab, presence of viral mutation(s) within the areas targeted by this assay, and inadequate number of viral copies (<250 copies / mL). A negative result must be combined with  clinical observations, patient history, and epidemiological information. Fact Sheet for Patients:   StrictlyIdeas.no Fact Sheet for Healthcare Providers: BankingDealers.co.za This test is not yet approved or cleared  by the [place] FDA and has been authorized for detection and/or diagnosis of SARS-CoV-2 by FDA under an Emergency Use Authorization (EUA).  This EUA will remain in effect (meaning this test can be used) for the duration of the COVID-19 declaration under Section 564(b)(1) of the Act, 21 U.S.C. section 360bbb-3(b)(1), unless the authorization is terminated or revoked sooner. Performed at [facility], [address] [place], [place] [address]     Coagulation Studies: No results for input(s): LABPROT, INR in the last 72 hours.  Urinalysis: No results for input(s): COLORURINE, LABSPEC, PHURINE, GLUCOSEU, HGBUR, BILIRUBINUR, KETONESUR, PROTEINUR, UROBILINOGEN, NITRITE, LEUKOCYTESUR in the last 72 hours.  Invalid input(s): APPERANCEUR    Imaging: CT ANGIO CHEST PE W OR WO CONTRAST  Result Date: 04/28/2020 CLINICAL DATA:  Respiratory failure. Shortness of breath. Dialysis patient post treatment today. Recent hospital admission and discharge for dyspnea and pulmonary edema. EXAM: CT ANGIOGRAPHY CHEST WITH CONTRAST TECHNIQUE: Multidetector CT imaging of the chest was performed using the standard protocol during bolus administration of intravenous contrast. Multiplanar CT image reconstructions and MIPs were obtained to evaluate the vascular anatomy. CONTRAST:  13mL OMNIPAQUE IOHEXOL 350 MG/ML SOLN COMPARISON:  Radiograph yesterday. Chest CT 09/19/2019 FINDINGS: Cardiovascular: There are no filling defects within the pulmonary arteries to suggest pulmonary embolus. Dense aortic atherosclerosis without dissection. Aortic tortuosity. No aneurysm. Cardiomegaly with coronary artery calcifications. No pericardial fluid.  Mediastinum/Nodes: No enlarged mediastinal or hilar lymph nodes. No esophageal wall thickening. 13 mm left thyroid nodule, does not meet size criteria for further evaluation or follow-up. Lungs/Pleura: Slight heterogeneous pulmonary parenchyma. Subpleural atelectasis in the right lower lobe. There is a 9 mm area of ground-glass nodularity in the left lower lobe, series 6, image 55. No septal thickening/pulmonary edema. No pleural fluid. Upper Abdomen: Ingested material within the stomach. Pancreatic tail lesion measuring 17 mm, grossly unchanged from prior. Inferior calcifications again seen. Cystic renal disease partially included. Musculoskeletal: There are no acute or suspicious osseous abnormalities. Review of the MIP images confirms the above findings. IMPRESSION: 1. No pulmonary embolus. 2. Slight heterogeneous pulmonary parenchyma, can be seen with small vessel or small airways disease. 3. A 9 mm area of ground-glass nodularity in the left lower lobe is likely infectious or inflammatory. This is new from October 2020 exam. Consider treatment like ground-glass nodule. Initial follow-up with CT at 6-12 months is recommended to confirm persistence. If persistent, repeat CT is recommended every 2 years until 5 years of stability has been established. This recommendation follows the consensus statement: Guidelines for Management of Incidental Pulmonary Nodules Detected on CT Images: From the Fleischner  Society 2017; Radiology 2017; 284:228-243. 4. Cardiomegaly with coronary artery calcifications. Aortic Atherosclerosis (ICD10-I70.0). Electronically Signed   By: Keith Rake M.D.   On: 04/28/2020 19:17   DG Chest Portable 1 View  Result Date: 04/27/2020 CLINICAL DATA:  74 year old female with history of shortness of breath. EXAM: PORTABLE CHEST 1 VIEW COMPARISON:  Chest x-ray 04/19/2020. FINDINGS: Lung volumes are low. No consolidative airspace disease. No pleural effusions. No pneumothorax. No pulmonary  nodule or mass noted. Pulmonary vasculature and the cardiomediastinal silhouette are within normal limits. Atherosclerosis in the thoracic aorta. IMPRESSION: 1. Low lung volumes without radiographic evidence of acute cardiopulmonary disease. 2. Aortic atherosclerosis. Electronically Signed   By: Vinnie Langton M.D.   On: 04/27/2020 19:35     Medications:   . sodium chloride     . sodium chloride   Intravenous Once  . aspirin EC  81 mg Oral Daily  . benzonatate  100 mg Oral TID  . calcium acetate  1,334 mg Oral TID WC  . carvedilol  12.5 mg Oral BID WC  . clopidogrel  75 mg Oral Daily  . [START ON 04/30/2020] epoetin (EPOGEN/PROCRIT) injection  10,000 Units Intravenous Q M,W,F-HD  . furosemide  60 mg Intravenous Q12H  . heparin  5,000 Units Subcutaneous Q8H  . hydrALAZINE  50 mg Oral TID  . iron polysaccharides  150 mg Oral Daily  . lidocaine  1 patch Transdermal Q24H  . loratadine  10 mg Oral Daily  . melatonin  5 mg Oral QHS  . mirtazapine  15 mg Oral QHS  . oxybutynin  15 mg Oral Daily  . pravastatin  40 mg Oral q1800  . sertraline  50 mg Oral QHS  . sodium chloride flush  3 mL Intravenous Q12H   sodium chloride, acetaminophen, albuterol, ALPRAZolam, fluticasone, HYDROcodone-acetaminophen, ipratropium-albuterol, ondansetron (ZOFRAN) IV, sodium chloride flush, zolpidem  Assessment/ Plan:   [name] is a 74 y.o. white female with end stage renal disease on hemodialysis, aortic stenosis, hypertension, COPD, diabetes mellitus type 2, overactive bladder, gout, GERD, chronic costochondritis admitted on 04/27/2020 to [facility] for Acute respiratory failure ([place]) [J96.00] SOB (shortness of breath) [R06.02] Acute on chronic diastolic (congestive) heart failure (HCC) [I50.33] Congestive heart failure, unspecified HF chronicity, unspecified heart failure type ([place]) [I50.9]   [facility] TTS [facility] [address] Left AV fistula 56kg  1.  ESRD on HD TTS.  Seen and examined on  hemodialysis treatment. Tolerating treatment well.   2.  Anemia of chronic kidney disease.  PRBC transfusion with HD treatment  - EPO with HD treatment  3. Hypertension: Home regimen of carvedilol, amlodipine, furosemide, hydralazine, losartan.   4.  Secondary hyperparathyroidism - Calcium acetate with meals.    LOS: 2 Morelia Cassells 6/10/20211:21 PM

## 2020-04-29 NOTE — Progress Notes (Signed)
PROGRESS NOTE    Phyllis [name]  ZWC:[id number] DOB: 04-Sep-1946 DOA: 04/27/2020 PCP: [name], MD   Chief Complaint  Patient presents with  . Shortness of Breath    Brief Narrative:  74 year old female with end-stage renal disease on hemodialysis (Tuesdays, Thursdays and Saturdays), diastolic CHF, asthma, COPD, type 2 diabetes mellitus, hypertension presented with worsening shortness of breath following hemodialysis on the day of admission.  Patient reported having wheezing and orthopnea associated with cough.  Reports this is her third presentation for similar symptoms in the past 1 month.  Found to be acute hypoxic respiratory failure with O2 sat of 82% requiring CPAP and then transition to BiPAP. Blood work in the ED showed BUN of 34 and creatinine 3.99, BNP of 556.  COVID-19 PCR negative.  Chest x-ray showing low lung volumes without acute cardiopulmonary disease. Admitted for further management.  Assessment & Plan:   Principal problem Acute respiratory failure with hypoxia (HCC)  Secondary to acute on chronic diastolic CHF and moderate to severe AS.    Patient reports that she has been getting increased fluid removal during dialysis on last few occasion.     on 80 mg p.o. Lasix at home.  Placed on 60 mg IV twice daily on admission. Strict I/Os and daily weight. Given recurrent symptoms in the past 10 days and right-sided chest pain, CT angiogram of the chest was done.  Negative for PE.  Showed 9 mm groundglass nodularity in the left lower lobe new from 08/2019. recommend follow-up with CT in 6-12 months followed by every 2 until 5 years per radiology.  Breathing better today.  Wean oxygen as tolerated.  Will assess for home O2 need.  Given recurrent respiratory symptoms, consulted palliative care for goals of care discussion.  Active problems ESRD on hemodialysis (TTS) Received scheduled dialysis today.  Renal following.  Essential hypertension Continue home meds  (amlodipine, hydralazine, Cozaar and Coreg).  Right-sided chest pain Currently stable.  Resumed Lidoderm patch.  Continue as needed Norco  Anemia of chronic kidney disease  drop in hemoglobin to 7.  Ordered 1 unit PRBC.  EPO with dialysis.  Check hemoglobin in a.m.  Chronic depression:  Continue Zoloft  Dyslipidemia Continue statin    DVT prophylaxis: Subcu Lovenox Code Status: Full code Family Communication: Caregiver at bedside Disposition:   Status is: Inpatient  Remains inpatient appropriate because:Hemodynamically unstable and Inpatient level of care appropriate due to severity of illness.  Discharge home tomorrow if H&H stable with transfusion.  Dispo: The patient is from: Home              Anticipated d/c is to: Home              Anticipated d/c date is: 1 day              Patient currently is not medically stable to d/c.       Consultants:   Renal   Procedures: None  Antimicrobials: None    Subjective: Seen after returning from dialysis.  Reports right-sided chest discomfort to be better.  Breathing improved.  Objective: Vitals:   04/29/20 1230 04/29/20 1234 04/29/20 1314 04/29/20 1316  BP: (!) 105/37 (!) 137/38 (!) 111/39   Pulse: (!) 55 (!) 55 (!) 58   Resp: 15 17    Temp:   98 F (36.7 C)   TempSrc:      SpO2: 100% 100% 100%   Weight:    56.8 kg  Height:  Intake/Output Summary (Last 24 hours) at 04/29/2020 1620 Last data filed at 04/29/2020 1230 Gross per 24 hour  Intake 3 ml  Output 2500 ml  Net -2497 ml   Filed Weights   04/29/20 0334 04/29/20 0901 04/29/20 1316  Weight: 56.5 kg 56.4 kg 56.8 kg   Physical exam Elderly female, appears fatigued HEENT: Moist mucosa, supple neck Chest: Improved breath sounds over bilateral lung bases CVs: Normal U2-V2, 3/6 systolic murmur GI: Soft, nondistended, nontender Musculoskeletal: Warm, no edema     Data Reviewed: I have personally reviewed following labs and imaging studies   CBC: Recent Labs  Lab 04/27/20 1918 04/28/20 0457 04/29/20 0556 04/29/20 1230 04/29/20 1552  WBC 7.3 4.9 5.6 10.1  --   NEUTROABS  --  4.3 3.8  --   --   HGB 8.0* 7.0* 6.0* 7.9* 6.7*  HCT 23.5* 20.5* 17.8* 24.2* 20.4*  MCV 88.7 89.1 90.8 91.0  --   PLT 149* 121* 108* 164  --     Basic Metabolic Panel: Recent Labs  Lab 04/27/20 1918 04/28/20 0457 04/28/20 1622 04/29/20 0556  NA 140 140 139 138  K 4.0 4.3 4.2 4.9  CL 100 104 98 99  CO2 29 25 29 28   GLUCOSE 154* 160* 154* 79  BUN 34* 42* 53* 60*  CREATININE 3.99* 4.31* 5.61* 6.57*  CALCIUM 8.3* 7.7* 8.4* 8.2*  PHOS  --   --  3.4  --     GFR: Estimated Creatinine Clearance: 5.7 mL/min (A) (by C-G formula based on SCr of 6.57 mg/dL (H)).  Liver Function Tests: Recent Labs  Lab 04/27/20 1918 04/28/20 1622  AST 18  --   ALT 13  --   ALKPHOS 101  --   BILITOT 0.5  --   PROT 6.3*  --   ALBUMIN 3.9 3.4*    CBG: No results for input(s): GLUCAP in the last 168 hours.   Recent Results (from the past 240 hour(s))  SARS Coronavirus 2 by RT PCR (hospital order, performed in [facility] hospital lab) Nasopharyngeal Nasopharyngeal Swab     Status: None   Collection Time: 04/20/20 12:00 AM   Specimen: Nasopharyngeal Swab  Result Value Ref Range Status   SARS Coronavirus 2 NEGATIVE NEGATIVE Final    Comment: (NOTE) SARS-CoV-2 target nucleic acids are NOT DETECTED. The SARS-CoV-2 RNA is generally detectable in upper and lower respiratory specimens during the acute phase of infection. The lowest concentration of SARS-CoV-2 viral copies this assay can detect is 250 copies / mL. A negative result does not preclude SARS-CoV-2 infection and should not be used as the sole basis for treatment or other patient management decisions.  A negative result may occur with improper specimen collection / handling, submission of specimen other than nasopharyngeal swab, presence of viral mutation(s) within the areas targeted by this  assay, and inadequate number of viral copies (<250 copies / mL). A negative result must be combined with clinical observations, patient history, and epidemiological information. Fact Sheet for Patients:   StrictlyIdeas.no Fact Sheet for Healthcare Providers: BankingDealers.co.za This test is not yet approved or cleared  by the [place] FDA and has been authorized for detection and/or diagnosis of SARS-CoV-2 by FDA under an Emergency Use Authorization (EUA).  This EUA will remain in effect (meaning this test can be used) for the duration of the COVID-19 declaration under Section 564(b)(1) of the Act, 21 U.S.C. section 360bbb-3(b)(1), unless the authorization is terminated or revoked sooner. Performed at [facility], [place], [place] [address]   MRSA PCR Screening     Status: None   Collection Time: 04/21/20  6:58 PM   Specimen: Nasal Mucosa; Nasopharyngeal  Result Value Ref Range Status   MRSA by PCR NEGATIVE NEGATIVE Final    Comment:        The GeneXpert MRSA Assay (FDA approved for NASAL specimens only), is one component of a comprehensive MRSA colonization surveillance program. It is not intended to diagnose MRSA infection nor to guide or monitor treatment for MRSA infections. Performed at [facility], [address] [place], [place] [address]   SARS Coronavirus 2 by RT PCR (hospital order, performed in [facility] hospital lab) Nasopharyngeal Nasopharyngeal Swab     Status: None   Collection Time: 04/27/20  8:30 PM   Specimen: Nasopharyngeal Swab  Result Value Ref Range Status   SARS Coronavirus 2 NEGATIVE NEGATIVE Final    Comment: (NOTE) SARS-CoV-2 target nucleic acids are NOT DETECTED. The SARS-CoV-2 RNA is generally detectable in upper and lower respiratory specimens during the acute phase of infection. The lowest concentration of SARS-CoV-2 viral copies this assay can detect  is 250 copies / mL. A negative result does not preclude SARS-CoV-2 infection and should not be used as the sole basis for treatment or other patient management decisions.  A negative result may occur with improper specimen collection / handling, submission of specimen other than nasopharyngeal swab, presence of viral mutation(s) within the areas targeted by this assay, and inadequate number of viral copies (<250 copies / mL). A negative result must be combined with clinical observations, patient history, and epidemiological information. Fact Sheet for Patients:   StrictlyIdeas.no Fact Sheet for Healthcare Providers: BankingDealers.co.za This test is not yet approved or cleared  by the [place] FDA and has been authorized for detection and/or diagnosis of SARS-CoV-2 by FDA under an Emergency Use Authorization (EUA).  This EUA will remain in effect (meaning this test can be used) for the duration of the COVID-19 declaration under Section 564(b)(1) of the Act, 21 U.S.C. section 360bbb-3(b)(1), unless the authorization is terminated or revoked sooner. Performed at [facility], [address], [place], [place] [address]          Radiology Studies: CT ANGIO CHEST PE W OR WO CONTRAST  Result Date: 04/28/2020 CLINICAL DATA:  Respiratory failure. Shortness of breath. Dialysis patient post treatment today. Recent hospital admission and discharge for dyspnea and pulmonary edema. EXAM: CT ANGIOGRAPHY CHEST WITH CONTRAST TECHNIQUE: Multidetector CT imaging of the chest was performed using the standard protocol during bolus administration of intravenous contrast. Multiplanar CT image reconstructions and MIPs were obtained to evaluate the vascular anatomy. CONTRAST:  44mL OMNIPAQUE IOHEXOL 350 MG/ML SOLN COMPARISON:  Radiograph yesterday. Chest CT 09/19/2019 FINDINGS: Cardiovascular: There are no filling defects within the pulmonary  arteries to suggest pulmonary embolus. Dense aortic atherosclerosis without dissection. Aortic tortuosity. No aneurysm. Cardiomegaly with coronary artery calcifications. No pericardial fluid. Mediastinum/Nodes: No enlarged mediastinal or hilar lymph nodes. No esophageal wall thickening. 13 mm left thyroid nodule, does not meet size criteria for further evaluation or follow-up. Lungs/Pleura: Slight heterogeneous pulmonary parenchyma. Subpleural atelectasis in the right lower lobe. There is a 9 mm area of ground-glass nodularity in the left lower lobe, series 6, image 55. No septal thickening/pulmonary edema. No pleural fluid. Upper Abdomen: Ingested material within the stomach. Pancreatic tail lesion measuring 17 mm, grossly unchanged from prior. Inferior calcifications again seen. Cystic renal disease partially  included. Musculoskeletal: There are no acute or suspicious osseous abnormalities. Review of the MIP images confirms the above findings. IMPRESSION: 1. No pulmonary embolus. 2. Slight heterogeneous pulmonary parenchyma, can be seen with small vessel or small airways disease. 3. A 9 mm area of ground-glass nodularity in the left lower lobe is likely infectious or inflammatory. This is new from October 2020 exam. Consider treatment like ground-glass nodule. Initial follow-up with CT at 6-12 months is recommended to confirm persistence. If persistent, repeat CT is recommended every 2 years until 5 years of stability has been established. This recommendation follows the consensus statement: Guidelines for Management of Incidental Pulmonary Nodules Detected on CT Images: From the Fleischner Society 2017; Radiology 2017; 284:228-243. 4. Cardiomegaly with coronary artery calcifications. Aortic Atherosclerosis (ICD10-I70.0). Electronically Signed   By: Keith Rake M.D.   On: 04/28/2020 19:17   DG Chest Portable 1 View  Result Date: 04/27/2020 CLINICAL DATA:  74 year old female with history of shortness of  breath. EXAM: PORTABLE CHEST 1 VIEW COMPARISON:  Chest x-ray 04/19/2020. FINDINGS: Lung volumes are low. No consolidative airspace disease. No pleural effusions. No pneumothorax. No pulmonary nodule or mass noted. Pulmonary vasculature and the cardiomediastinal silhouette are within normal limits. Atherosclerosis in the thoracic aorta. IMPRESSION: 1. Low lung volumes without radiographic evidence of acute cardiopulmonary disease. 2. Aortic atherosclerosis. Electronically Signed   By: Vinnie Langton M.D.   On: 04/27/2020 19:35        Scheduled Meds: . sodium chloride   Intravenous Once  . aspirin EC  81 mg Oral Daily  . benzonatate  100 mg Oral TID  . calcium acetate  1,334 mg Oral TID WC  . carvedilol  12.5 mg Oral BID WC  . clopidogrel  75 mg Oral Daily  . [START ON 04/30/2020] epoetin (EPOGEN/PROCRIT) injection  10,000 Units Intravenous Q M,W,F-HD  . furosemide  60 mg Intravenous Q12H  . heparin  5,000 Units Subcutaneous Q8H  . hydrALAZINE  50 mg Oral TID  . iron polysaccharides  150 mg Oral Daily  . lidocaine  1 patch Transdermal Q24H  . loratadine  10 mg Oral Daily  . melatonin  5 mg Oral QHS  . mirtazapine  15 mg Oral QHS  . oxybutynin  15 mg Oral Daily  . pravastatin  40 mg Oral q1800  . sertraline  50 mg Oral QHS  . sodium chloride flush  3 mL Intravenous Q12H   Continuous Infusions: . sodium chloride       LOS: 2 days    Time spent: 25 minutes    Kumar Falwell, MD [facility]   To contact the attending provider between 7A-7P or the covering provider during after hours 7P-7A, please log into the web site [email or web address] and access using universal [place] password for that web site. If you do not have the password, please call the hospital operator.  04/29/2020, 4:20 PM

## 2020-04-29 NOTE — Progress Notes (Signed)
Post dialysis hemoglobin shows hgb of 7.9.  MD order to hold blood transfusion for now and repeat hemoglobin at 1600.

## 2020-04-29 NOTE — Progress Notes (Signed)
Patient 1600 hemoglobin check is 6.7 Dr. Clementeen Graham notified. Dr Clementeen Graham ordered to transfuse the 1 unit of PRBC's that were ordered and held after dialysis this a.m.

## 2020-04-30 DIAGNOSIS — Z515 Encounter for palliative care: Secondary | ICD-10-CM

## 2020-04-30 DIAGNOSIS — I5033 Acute on chronic diastolic (congestive) heart failure: Secondary | ICD-10-CM | POA: Diagnosis present

## 2020-04-30 DIAGNOSIS — Z7189 Other specified counseling: Secondary | ICD-10-CM

## 2020-04-30 DIAGNOSIS — J449 Chronic obstructive pulmonary disease, unspecified: Secondary | ICD-10-CM | POA: Diagnosis present

## 2020-04-30 LAB — CBC WITH DIFFERENTIAL/PLATELET
Abs Immature Granulocytes: 0.01 10*3/uL (ref 0.00–0.07)
Basophils Absolute: 0 10*3/uL (ref 0.0–0.1)
Basophils Relative: 0 %
Eosinophils Absolute: 0.2 10*3/uL (ref 0.0–0.5)
Eosinophils Relative: 4 %
HCT: 25.1 % — ABNORMAL LOW (ref 36.0–46.0)
Hemoglobin: 8.3 g/dL — ABNORMAL LOW (ref 12.0–15.0)
Immature Granulocytes: 0 %
Lymphocytes Relative: 19 %
Lymphs Abs: 1.1 10*3/uL (ref 0.7–4.0)
MCH: 30.9 pg (ref 26.0–34.0)
MCHC: 33.1 g/dL (ref 30.0–36.0)
MCV: 93.3 fL (ref 80.0–100.0)
Monocytes Absolute: 0.7 10*3/uL (ref 0.1–1.0)
Monocytes Relative: 11 %
Neutro Abs: 3.9 10*3/uL (ref 1.7–7.7)
Neutrophils Relative %: 66 %
Platelets: 136 10*3/uL — ABNORMAL LOW (ref 150–400)
RBC: 2.69 MIL/uL — ABNORMAL LOW (ref 3.87–5.11)
RDW: 13.5 % (ref 11.5–15.5)
WBC: 6 10*3/uL (ref 4.0–10.5)
nRBC: 0 % (ref 0.0–0.2)

## 2020-04-30 LAB — BASIC METABOLIC PANEL
Anion gap: 13 (ref 5–15)
BUN: 23 mg/dL (ref 8–23)
CO2: 27 mmol/L (ref 22–32)
Calcium: 8.6 mg/dL — ABNORMAL LOW (ref 8.9–10.3)
Chloride: 98 mmol/L (ref 98–111)
Creatinine, Ser: 4.19 mg/dL — ABNORMAL HIGH (ref 0.44–1.00)
GFR calc Af Amer: 11 mL/min — ABNORMAL LOW (ref >60)
GFR calc non Af Amer: 10 mL/min — ABNORMAL LOW (ref >60)
Glucose, Bld: 77 mg/dL (ref 70–99)
Potassium: 4.5 mmol/L (ref 3.5–5.1)
Sodium: 138 mmol/L (ref 135–145)

## 2020-04-30 MED ORDER — BUDESONIDE-FORMOTEROL FUMARATE 160-4.5 MCG/ACT IN AERO: 2.0000 | INHALATION_SPRAY | Freq: Two times a day (BID) | RESPIRATORY_TRACT | 3 refills | Status: DC

## 2020-04-30 MED ORDER — CARVEDILOL 3.125 MG PO TABS: 3.1250 mg | ORAL_TABLET | Freq: Two times a day (BID) | ORAL | 0 refills | Status: AC

## 2020-04-30 MED ORDER — EPOETIN ALFA 10000 UNIT/ML IJ SOLN
10000.0000 [IU] | Freq: Once | INTRAMUSCULAR | Status: AC
  Administered 2020-04-30: 10000 [IU] via INTRAVENOUS
  Filled 2020-04-30: qty 1

## 2020-04-30 NOTE — Care Management Important Message (Signed)
Important Message  Patient Details  Name: Phyllis [name] MRN: [id number] Date of Birth: June 17, 1946   Medicare Important Message Given:  Yes     [name] 04/30/2020, 12:14 PM

## 2020-04-30 NOTE — Progress Notes (Signed)
SATURATION QUALIFICATIONS: (This note is used to comply with regulatory documentation for home oxygen)  Patient Saturations on Room Air at Rest = 97%  Patient Saturations on Room Air while Ambulating = 97%    Patient does not qualify for home oxygen at this time.

## 2020-04-30 NOTE — Plan of Care (Signed)
Pt discharged home with caregiver, VSS,

## 2020-04-30 NOTE — Discharge Summary (Signed)
Physician Discharge Summary  Phyllis [name] LFY:[id number] DOB: [birth year] DOA: 04/27/2020  PCP: [name], MD  Admit date: 04/27/2020 Discharge date: 04/30/2020  Admitted From: Home Disposition: Home  Recommendations for Outpatient Follow-up:  Follow-up with hemodialysis as scheduled.  Follow-up with heart failure clinic on 6/14.  Home Health: None Equipment/Devices: None  Discharge Condition: Fair CODE STATUS: Full code Diet recommendation: Heart Healthy / Carb Modified  Discharge Diagnoses:  Principal Problem:   Acute respiratory failure with hypoxemia (HCC)    Active Problems:   Acute on chronic diastolic (congestive) heart failure (HCC)   ESRD (end stage renal disease) on dialysis (HCC)   Type 2 diabetes mellitus with other specified complication (HCC)   Asthma   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-stage renal disease) ([place])   Anxiety   COPD with chronic bronchitis [facility])  Brief narrative/HPI 74 year old female with end-stage renal disease on hemodialysis (Tuesdays, Thursdays and Saturdays), diastolic CHF, asthma, COPD, type 2 diabetes mellitus, hypertension presented with worsening shortness of breath following hemodialysis on the day of admission.  Patient reported having wheezing and orthopnea associated with cough.  Reports this is her third presentation for similar symptoms in the past 1 month.  Found to be acute hypoxic respiratory failure with O2 sat of 82% requiring CPAP and then transition to BiPAP. Blood work in the ED showed BUN of 34 and creatinine 3.99, BNP of 556.  COVID-19 PCR negative.  Chest x-ray showing low lung volumes without acute cardiopulmonary disease. Admitted for further management.  Hospital course  Principal problem Acute respiratory failure with hypoxia (HCC)  Suspect due to acute on chronic diastolic CHF and moderate to severe AS.    Also possible for mild COPD exacerbation.  Patient  has been getting increased fluid removal during dialysis on last  few occasion.    Symptoms improved after receiving IV Lasix and hemodialysis yesterday.  2D echo shows normal EF with moderate to severe AS.  Given possibility of COPD exacerbation added Symbicort in addition to home inhaler and neb.  Will reduce her Coreg dose to 3.125 mg twice daily.  Has appointment in the heart failure clinic early next week.   Dyspnea improved and maintaining sats on room air.  Does not need home O2.  Given recurrent respiratory symptoms with hospitalization, recommend outpatient palliative care consult.    Active problems ESRD on hemodialysis (TTS) Received scheduled dialysis on 6/10.  Renal consult appreciated.  Outpatient HD as scheduled.  Essential hypertension Continue home meds (amlodipine, hydralazine, Cozaar and Coreg).  Right-sided chest pain Stable after home dose Norco and Lidoderm patch resumed.  Anemia of chronic kidney disease  drop in hemoglobin to 6.7.  Received 1 unit PRBC with improvement.  Also received EPO with dialysis.   Chronic depression:  Continue Zoloft  Dyslipidemia Continue statin  Left lower lobe lung nodule Seen on CT chest done given recurrent respiratory symptoms and right-sided chest pressure.  Negative for PE.  Shows a 9 mm groundglass nodularity in the left lower lobe which is new from 08/2019. Needs follow-up CT in 6-12 months followed by every 2 years until 5 years per radiology. needs follow-up CT in 6-12 months followed by every 2 years until 5 years per radiology.  Procedure: CT chest, 2D echo  Consult: Renal Family communication: Caregiver at bedside Disposition: Home   Discharge Instructions   Allergies as of 04/30/2020      Reactions   Enalapril Maleate Other (See Comments)   Other reaction(s): Headache   Nitrofurantoin Swelling,  Rash   Other Reaction: swelling of body   Sulfamethoxazole-trimethoprim Swelling   2,4-d Dimethylamine (amisol) Rash, Other (See Comments)   Other Reaction: h/a    Baclofen Other (See Comments), Nausea Only   lightheadness ,drowsiness , muscle weakness , twitching in hands    Neosporin [neomycin-bacitracin Zn-polymyx] Other (See Comments), Rash   Other Reaction: irritation Skin irritation   Quinine Nausea And Vomiting, Rash, Other (See Comments)   Other Reaction: Vomiting, rash, h/a, vision   Ultram [tramadol] Palpitations   Zocor [simvastatin] Other (See Comments), Rash   Other Reaction: muscle spasms Muscle pain and spasms   Bactrim [sulfamethoxazole-trimethoprim] Swelling   Levodopa Other (See Comments)   Reaction: unknown   Macrodantin [nitrofurantoin Macrocrystal] Swelling   Quinine Derivatives Other (See Comments)   Vertigo,nausea vomiting blurred vision headache ears sensitivity      Medication List    TAKE these medications   acetaminophen 500 MG tablet Commonly known as: TYLENOL Take 1 tablet (500 mg total) by mouth every 6 (six) hours as needed for mild pain or fever.   albuterol 108 (90 Base) MCG/ACT inhaler Commonly known as: VENTOLIN HFA Inhale 2 puffs into the lungs every 6 (six) hours as needed for wheezing or shortness of breath. Out of medication   amLODipine 10 MG tablet Commonly known as: NORVASC Take 10 mg by mouth daily.   Aspirin 81 81 MG EC tablet Generic drug: aspirin Take 1 tablet by mouth daily.   benzonatate 100 MG capsule Commonly known as: TESSALON Take 100 mg by mouth 3 (three) times daily.   budesonide-formoterol 160-4.5 MCG/ACT inhaler Commonly known as: Symbicort Inhale 2 puffs into the lungs in the morning and at bedtime.   calcium acetate 667 MG capsule Commonly known as: PHOSLO Take 2 capsules by mouth in the morning, at noon, and at bedtime.   carvedilol 3.125 MG tablet Commonly known as: COREG Take 1 tablet (3.125 mg total) by mouth 2 (two) times daily with a meal. What changed:   medication strength  how much to take   cetirizine 10 MG tablet Commonly known as: ZYRTEC Take 10 mg  by mouth daily as needed for allergies.   clopidogrel 75 MG tablet Commonly known as: PLAVIX Take 1 tablet (75 mg total) by mouth daily.   [employer] Melatonin 5 MG Chew Generic drug: Melatonin Chew 1 tablet by mouth at bedtime.   epoetin alfa 10000 UNIT/ML injection Commonly known as: EPOGEN Inject 0.4 mLs (4,000 Units total) into the vein Every Tuesday,Thursday,and Saturday with dialysis.   furosemide 80 MG tablet Commonly known as: Lasix Take 1 tablet (80 mg total) by mouth daily.   hydrALAZINE 50 MG tablet Commonly known as: APRESOLINE TAKE 1 TABLET BY MOUTH EVERY 8 HOURS What changed: when to take this   HYDROcodone-acetaminophen 7.5-325 MG tablet Commonly known as: NORCO Take 1 tablet by mouth every 6 (six) hours as needed (pain). What changed:   how much to take  when to take this   ipratropium-albuterol 0.5-2.5 (3) MG/3ML Soln Commonly known as: DUONEB Take 3 mLs by nebulization every 6 (six) hours as needed.   iron polysaccharides 150 MG capsule Commonly known as: NIFEREX Take 150 mg by mouth daily.   lidocaine 5 % Commonly known as: LIDODERM Place 1 patch onto the skin daily. Remove & Discard patch within 12 hours or as directed by MD   lidocaine-prilocaine cream Commonly known as: EMLA Apply 1 application topically as needed (prior to treatment).   losartan 50 MG  tablet Commonly known as: COZAAR Take 50 mg by mouth daily.   mirtazapine 15 MG tablet Commonly known as: REMERON Take 15 mg by mouth at bedtime.   mometasone 50 MCG/ACT nasal spray Commonly known as: NASONEX Place 2 sprays into the nose daily as needed (allergies).   oxybutynin 15 MG 24 hr tablet Commonly known as: DITROPAN XL Take 15 mg by mouth daily.   pravastatin 40 MG tablet Commonly known as: PRAVACHOL Take 40 mg by mouth at bedtime.   sertraline 50 MG tablet Commonly known as: ZOLOFT Take 50 mg by mouth at bedtime.   topiramate 25 MG tablet Commonly known as: TOPAMAX Take  1 tablet (25 mg total) by mouth as needed (for headaches).       Follow-up Information    [facility] up on 05/03/2020.   Specialty: Cardiology Why: at 3:30pm. Enter through the [place] entrance Contact information: [place] [phone]       [name], MD. Schedule an appointment as soon as possible for a visit in 1 week(s).   Specialty: Internal Medicine Contact information: [place] [address] [phone]        Minna Merritts, MD .   Specialty: Cardiology Contact information: [address] [place]  [address] [phone]              Allergies  Allergen Reactions  . Enalapril Maleate Other (See Comments)    Other reaction(s): Headache  . Nitrofurantoin Swelling and Rash    Other Reaction: swelling of body  . Sulfamethoxazole-Trimethoprim Swelling  . 2,4-D Dimethylamine (Amisol) Rash and Other (See Comments)    Other Reaction: h/a  . Baclofen Other (See Comments) and Nausea Only    lightheadness ,drowsiness , muscle weakness , twitching in hands   . Neosporin [Neomycin-Bacitracin Zn-Polymyx] Other (See Comments) and Rash    Other Reaction: irritation Skin irritation   . Quinine Nausea And Vomiting, Rash and Other (See Comments)    Other Reaction: Vomiting, rash, h/a, vision  . Ultram [Tramadol] Palpitations  . Zocor [Simvastatin] Other (See Comments) and Rash    Other Reaction: muscle spasms Muscle pain and spasms  . Bactrim [Sulfamethoxazole-Trimethoprim] Swelling  . Levodopa Other (See Comments)    Reaction: unknown  . Macrodantin [Nitrofurantoin Macrocrystal] Swelling  . Quinine Derivatives Other (See Comments)    Vertigo,nausea vomiting blurred vision headache ears sensitivity     Procedures/Studies: DG Chest 1 View  Result Date: 04/01/2020 CLINICAL DATA:   Shortness of breath EXAM: CHEST  1 VIEW COMPARISON:  Mar 29, 2020 FINDINGS: The heart size and mediastinal contours are unchanged with mild cardiomegaly. Aortic knob calcifications. Again noted are multifocal airspace opacities and increased interstitial markings throughout both lungs as on the prior exam. There is slight interval worsening in the right upper lung airspace opacities. Pleural calcifications seen in the right upper lung. IMPRESSION: Multifocal airspace opacities throughout both lungs with slight interval worsening in the right upper lung which could be due to multifocal pneumonia or asymmetric edema with superimposed chronic lung changes. Electronically Signed   By: Prudencio Pair M.D.   On: 04/01/2020 00:20   CT ANGIO CHEST PE W OR WO CONTRAST  Result Date: 04/28/2020 CLINICAL DATA:  Respiratory failure. Shortness of breath. Dialysis patient post treatment today. Recent hospital admission and discharge for dyspnea and pulmonary edema. EXAM: CT ANGIOGRAPHY CHEST WITH  CONTRAST TECHNIQUE: Multidetector CT imaging of the chest was performed using the standard protocol during bolus administration of intravenous contrast. Multiplanar CT image reconstructions and MIPs were obtained to evaluate the vascular anatomy. CONTRAST:  13mL OMNIPAQUE IOHEXOL 350 MG/ML SOLN COMPARISON:  Radiograph yesterday. Chest CT 09/19/2019 FINDINGS: Cardiovascular: There are no filling defects within the pulmonary arteries to suggest pulmonary embolus. Dense aortic atherosclerosis without dissection. Aortic tortuosity. No aneurysm. Cardiomegaly with coronary artery calcifications. No pericardial fluid. Mediastinum/Nodes: No enlarged mediastinal or hilar lymph nodes. No esophageal wall thickening. 13 mm left thyroid nodule, does not meet size criteria for further evaluation or follow-up. Lungs/Pleura: Slight heterogeneous pulmonary parenchyma. Subpleural atelectasis in the right lower lobe. There is a 9 mm area of ground-glass  nodularity in the left lower lobe, series 6, image 55. No septal thickening/pulmonary edema. No pleural fluid. Upper Abdomen: Ingested material within the stomach. Pancreatic tail lesion measuring 17 mm, grossly unchanged from prior. Inferior calcifications again seen. Cystic renal disease partially included. Musculoskeletal: There are no acute or suspicious osseous abnormalities. Review of the MIP images confirms the above findings. IMPRESSION: 1. No pulmonary embolus. 2. Slight heterogeneous pulmonary parenchyma, can be seen with small vessel or small airways disease. 3. A 9 mm area of ground-glass nodularity in the left lower lobe is likely infectious or inflammatory. This is new from October 2020 exam. Consider treatment like ground-glass nodule. Initial follow-up with CT at 6-12 months is recommended to confirm persistence. If persistent, repeat CT is recommended every 2 years until 5 years of stability has been established. This recommendation follows the consensus statement: Guidelines for Management of Incidental Pulmonary Nodules Detected on CT Images: From the Fleischner Society 2017; Radiology 2017; 284:228-243. 4. Cardiomegaly with coronary artery calcifications. Aortic Atherosclerosis (ICD10-I70.0). Electronically Signed   By: Keith Rake M.D.   On: 04/28/2020 19:17   DG Chest Portable 1 View  Result Date: 04/27/2020 CLINICAL DATA:  74 year old female with history of shortness of breath. EXAM: PORTABLE CHEST 1 VIEW COMPARISON:  Chest x-ray 04/19/2020. FINDINGS: Lung volumes are low. No consolidative airspace disease. No pleural effusions. No pneumothorax. No pulmonary nodule or mass noted. Pulmonary vasculature and the cardiomediastinal silhouette are within normal limits. Atherosclerosis in the thoracic aorta. IMPRESSION: 1. Low lung volumes without radiographic evidence of acute cardiopulmonary disease. 2. Aortic atherosclerosis. Electronically Signed   By: Vinnie Langton M.D.   On:  04/27/2020 19:35   DG Chest Portable 1 View  Result Date: 04/19/2020 CLINICAL DATA:  Shortness of breath EXAM: PORTABLE CHEST 1 VIEW COMPARISON:  Apr 01, 2020 FINDINGS: Again noted is mild cardiomegaly. Aortic knob calcifications. Multifocal hazy airspace opacity and diffuse increased interstitial opacity seen throughout both lungs, this does not appear to be significantly changed since the prior exam. No acute osseous findings. IMPRESSION: Stable multifocal patchy interstitial opacities throughout both lungs which may be due to chronic lung changes with superimposed infectious etiology and/or edema. Electronically Signed   By: Prudencio Pair M.D.   On: 04/19/2020 23:56   ECHOCARDIOGRAM COMPLETE  Result Date: 04/20/2020    ECHOCARDIOGRAM REPORT   Patient Name:   EILEY MCGINNITY Tutterow Date of Exam: 04/20/2020 Medical Rec #:  [id number]           Height:       58.0 in Accession #:    [id number]          Weight:       125.2 lb Date of Birth:  [birth year]  BSA:          1.492 m Patient Age:    16 years            BP:           154/51 mmHg Patient Gender: F                   HR:           78 bpm. Exam Location:  [facility] Procedure: 2D Echo, Color Doppler and Cardiac Doppler Indications:     I50.31 CHF-Acute Diastolic  History:         Patient has prior history of Echocardiogram examinations, most                  recent 09/21/2019. HFpEF, ESRD; Risk Factors:Sleep Apnea,                  Dyslipidemia and Diabetes. Pt tested positive for COVID-19 on                  12/08/19.  Sonographer:     Charmayne Sheer RDCS (AE) Referring Phys:  [id number] Athena Masse Diagnosing Phys: [name] MD  Sonographer Comments: No subcostal window and suboptimal parasternal window. IMPRESSIONS  1. Left ventricular ejection fraction, by estimation, is 60 to 65%. The left ventricle has normal function. The left ventricle has no regional wall motion abnormalities. Left ventricular diastolic parameters are consistent with Grade I diastolic  dysfunction (impaired relaxation). Elevated left atrial pressure.  2. Right ventricular systolic function is normal. The right ventricular size is normal. Tricuspid regurgitation signal is inadequate for assessing PA pressure.  3. Left atrial size was mildly dilated.  4. Moderately elevated transmitral gradient is noted, most likely due to bulky annular calcification. The mitral valve was not well visualized. Trivial mitral valve regurgitation.  5. The aortic valve has an indeterminant number of cusps. Aortic valve regurgitation is trivial. Moderate to severe aortic valve stenosis. Aortic valve mean gradient measures 23.0 mmHg. Aortic valve Vmax measures 3.33 m/s. FINDINGS  Left Ventricle: Left ventricular ejection fraction, by estimation, is 60 to 65%. The left ventricle has normal function. The left ventricle has no regional wall motion abnormalities. The left ventricular internal cavity size was normal in size. There is  no left ventricular hypertrophy. Left ventricular diastolic parameters are consistent with Grade I diastolic dysfunction (impaired relaxation). Elevated left atrial pressure. Right Ventricle: The right ventricular size is normal. No increase in right ventricular wall thickness. Right ventricular systolic function is normal. Tricuspid regurgitation signal is inadequate for assessing PA pressure. Left Atrium: Left atrial size was mildly dilated. Right Atrium: Right atrial size was normal in size. Pericardium: The pericardium was not well visualized. Mitral Valve: Moderately elevated transmitral gradient is noted, most likely due to bulky annular calcification. The mitral valve was not well visualized. Moderate mitral annular calcification. Trivial mitral valve regurgitation. MV peak gradient, 10.1 mmHg. The mean mitral valve gradient is 7.0 mmHg. Tricuspid Valve: The tricuspid valve is not well visualized. Tricuspid valve regurgitation is mild. Aortic Valve: The aortic valve has an indeterminant  number of cusps. . There is severe thickening and severe calcifcation of the aortic valve. Aortic valve regurgitation is trivial. Moderate to severe aortic stenosis is present. There is severe thickening of the aortic valve. There is severe calcifcation of the aortic valve. Aortic valve mean gradient measures 23.0 mmHg. Aortic valve peak gradient measures 44.4 mmHg. Aortic valve area, by VTI measures 0.78  cm. Pulmonic Valve: The pulmonic valve was not well visualized. Pulmonic valve regurgitation is not visualized. No evidence of pulmonic stenosis. Aorta: The aortic root is normal in size and structure. Pulmonary Artery: The pulmonary artery is not well seen. Venous: The inferior vena cava was not well visualized. IAS/Shunts: The interatrial septum was not well visualized.  LEFT VENTRICLE PLAX 2D LVIDd:         4.23 cm  Diastology LVIDs:         2.31 cm  LV e' lateral:   5.87 cm/s LV PW:         0.94 cm  LV E/e' lateral: 23.7 LV IVS:        0.61 cm  LV e' medial:    4.57 cm/s LVOT diam:     1.80 cm  LV E/e' medial:  30.4 LV SV:         59 LV SV Index:   40 LVOT Area:     2.54 cm  RIGHT VENTRICLE RV Basal diam:  2.58 cm LEFT ATRIUM             Index       RIGHT ATRIUM          Index LA diam:        3.50 cm 2.35 cm/m  RA Area:     8.07 cm LA Vol (A2C):   41.7 ml 27.94 ml/m RA Volume:   13.00 ml 8.71 ml/m LA Vol (A4C):   61.6 ml 41.27 ml/m LA Biplane Vol: 51.4 ml 34.44 ml/m  AORTIC VALVE                    PULMONIC VALVE AV Area (Vmax):    0.79 cm     PV Vmax:       1.35 m/s AV Area (Vmean):   0.79 cm     PV Vmean:      78.000 cm/s AV Area (VTI):     0.78 cm     PV VTI:        0.240 m AV Vmax:           333.00 cm/s  PV Peak grad:  7.3 mmHg AV Vmean:          215.000 cm/s PV Mean grad:  3.0 mmHg AV VTI:            0.760 m AV Peak Grad:      44.4 mmHg AV Mean Grad:      23.0 mmHg LVOT Vmax:         103.00 cm/s LVOT Vmean:        67.000 cm/s LVOT VTI:          0.232 m LVOT/AV VTI ratio: 0.31  AORTA Ao Root  diam: 2.80 cm MITRAL VALVE MV Area (PHT): 3.72 cm     SHUNTS MV Peak grad:  10.1 mmHg    Systemic VTI:  0.23 m MV Mean grad:  7.0 mmHg     Systemic Diam: 1.80 cm MV Vmax:       1.59 m/s MV Vmean:      122.0 cm/s MV Decel Time: 204 msec MV E velocity: 139.00 cm/s MV A velocity: 154.00 cm/s MV E/A ratio:  0.90 Harrell Gave End MD Electronically signed by [name] MD Signature Date/Time: 04/20/2020/5:42:36 PM    Final      Subjective: Seen and examined.  Breathing better.  No overnight events.  Discharge Exam: Vitals:   04/30/20 0751  04/30/20 1134  BP: (!) 136/42 (!) 140/42  Pulse: 69 70  Resp: 18 18  Temp: 98.4 F (36.9 C) 98.3 F (36.8 C)  SpO2: 100% 99%   Vitals:   04/29/20 2111 04/30/20 0504 04/30/20 0751 04/30/20 1134  BP: (!) 139/38 (!) 124/42 (!) 136/42 (!) 140/42  Pulse: 62 64 69 70  Resp:  15 18 18   Temp: 98.5 F (36.9 C) 98.4 F (36.9 C) 98.4 F (36.9 C) 98.3 F (36.8 C)  TempSrc: Oral Oral Oral   SpO2: 100% 100% 100% 99%  Weight:  55.9 kg    Height:        Physical exam Elderly female not in distress HEENT: Moist mucosa, supple neck Chest: Clear bilaterally CVs: Normal B1-D1, 3/6 systolic murmur GI: Soft, nondistended, nontender Musculoskeletal: Warm, no edema   The results of significant diagnostics from this hospitalization (including imaging, microbiology, ancillary and laboratory) are listed below for reference.     Microbiology: Recent Results (from the past 240 hour(s))  MRSA PCR Screening     Status: None   Collection Time: 04/21/20  6:58 PM   Specimen: Nasal Mucosa; Nasopharyngeal  Result Value Ref Range Status   MRSA by PCR NEGATIVE NEGATIVE Final    Comment:        The GeneXpert MRSA Assay (FDA approved for NASAL specimens only), is one component of a comprehensive MRSA colonization surveillance program. It is not intended to diagnose MRSA infection nor to guide or monitor treatment for MRSA infections. Performed at [facility], [address] [place], [place] [address]   SARS Coronavirus 2 by RT PCR (hospital order, performed in [facility] hospital lab) Nasopharyngeal Nasopharyngeal Swab     Status: None   Collection Time: 04/27/20  8:30 PM   Specimen: Nasopharyngeal Swab  Result Value Ref Range Status   SARS Coronavirus 2 NEGATIVE NEGATIVE Final    Comment: (NOTE) SARS-CoV-2 target nucleic acids are NOT DETECTED. The SARS-CoV-2 RNA is generally detectable in upper and lower respiratory specimens during the acute phase of infection. The lowest concentration of SARS-CoV-2 viral copies this assay can detect is 250 copies / mL. A negative result does not preclude SARS-CoV-2 infection and should not be used as the sole basis for treatment or other patient management decisions.  A negative result may occur with improper specimen collection / handling, submission of specimen other than nasopharyngeal swab, presence of viral mutation(s) within the areas targeted by this assay, and inadequate number of viral copies (<250 copies / mL). A negative result must be combined with clinical observations, patient history, and epidemiological information. Fact Sheet for Patients:   StrictlyIdeas.no Fact Sheet for Healthcare Providers: BankingDealers.co.za This test is not yet approved or cleared  by the [place] FDA and has been authorized for detection and/or diagnosis of SARS-CoV-2 by FDA under an Emergency Use Authorization (EUA).  This EUA will remain in effect (meaning this test can be used) for the duration of the COVID-19 declaration under Section 564(b)(1) of the Act, 21 U.S.C. section 360bbb-3(b)(1), unless the authorization is terminated or revoked sooner. Performed at [facility], [address] [place], [place] [address]      Labs: BNP (last 3 results) Recent Labs    04/02/20 0925 04/19/20 2338 04/27/20 1918  BNP 1,135.0*  960.8* 626.9*   Basic Metabolic Panel: Recent Labs  Lab 04/27/20 1918 04/28/20 0457 04/28/20 1622 04/29/20 0556 04/30/20 0641  NA 140 140 139 138 138  K 4.0 4.3  4.2 4.9 4.5  CL 100 104 98 99 98  CO2 29 25 29 28 27   GLUCOSE 154* 160* 154* 79 77  BUN 34* 42* 53* 60* 23  CREATININE 3.99* 4.31* 5.61* 6.57* 4.19*  CALCIUM 8.3* 7.7* 8.4* 8.2* 8.6*  PHOS  --   --  3.4  --   --    Liver Function Tests: Recent Labs  Lab 04/27/20 1918 04/28/20 1622  AST 18  --   ALT 13  --   ALKPHOS 101  --   BILITOT 0.5  --   PROT 6.3*  --   ALBUMIN 3.9 3.4*   No results for input(s): LIPASE, AMYLASE in the last 168 hours. No results for input(s): AMMONIA in the last 168 hours. CBC: Recent Labs  Lab 04/27/20 1918 04/27/20 1918 04/28/20 0457 04/29/20 0556 04/29/20 1230 04/29/20 1552 04/30/20 0641  WBC 7.3  --  4.9 5.6 10.1  --  6.0  NEUTROABS  --   --  4.3 3.8  --   --  3.9  HGB 8.0*   < > 7.0* 6.0* 7.9* 6.7* 8.3*  HCT 23.5*   < > 20.5* 17.8* 24.2* 20.4* 25.1*  MCV 88.7  --  89.1 90.8 91.0  --  93.3  PLT 149*  --  121* 108* 164  --  136*   < > = values in this interval not displayed.   Cardiac Enzymes: No results for input(s): CKTOTAL, CKMB, CKMBINDEX, TROPONINI in the last 168 hours. BNP: Invalid input(s): POCBNP CBG: No results for input(s): GLUCAP in the last 168 hours. D-Dimer No results for input(s): DDIMER in the last 72 hours. Hgb A1c No results for input(s): HGBA1C in the last 72 hours. Lipid Profile No results for input(s): CHOL, HDL, LDLCALC, TRIG, CHOLHDL, LDLDIRECT in the last 72 hours. Thyroid function studies No results for input(s): TSH, T4TOTAL, T3FREE, THYROIDAB in the last 72 hours.  Invalid input(s): FREET3 Anemia work up No results for input(s): VITAMINB12, FOLATE, FERRITIN, TIBC, IRON, RETICCTPCT in the last 72 hours. Urinalysis    Component Value Date/Time   COLORURINE YELLOW (A) 12/04/2019 2054   APPEARANCEUR CLOUDY (A) 12/04/2019 2054   LABSPEC  1.008 12/04/2019 2054   PHURINE 8.0 12/04/2019 2054   GLUCOSEU NEGATIVE 12/04/2019 2054   HGBUR MODERATE (A) 12/04/2019 2054   BILIRUBINUR NEGATIVE 12/04/2019 2054   KETONESUR NEGATIVE 12/04/2019 2054   PROTEINUR 100 (A) 12/04/2019 2054   NITRITE NEGATIVE 12/04/2019 2054   LEUKOCYTESUR LARGE (A) 12/04/2019 2054   Sepsis Labs Invalid input(s): PROCALCITONIN,  WBC,  LACTICIDVEN Microbiology Recent Results (from the past 240 hour(s))  MRSA PCR Screening     Status: None   Collection Time: 04/21/20  6:58 PM   Specimen: Nasal Mucosa; Nasopharyngeal  Result Value Ref Range Status   MRSA by PCR NEGATIVE NEGATIVE Final    Comment:        The GeneXpert MRSA Assay (FDA approved for NASAL specimens only), is one component of a comprehensive MRSA colonization surveillance program. It is not intended to diagnose MRSA infection nor to guide or monitor treatment for MRSA infections. Performed at [facility], [address] [place],  [address]   SARS Coronavirus 2 by RT PCR (hospital order, performed in [facility] hospital lab) Nasopharyngeal Nasopharyngeal Swab     Status: None   Collection Time: 04/27/20  8:30 PM   Specimen: Nasopharyngeal Swab  Result Value Ref Range Status   SARS Coronavirus 2 NEGATIVE NEGATIVE Final  Comment: (NOTE) SARS-CoV-2 target nucleic acids are NOT DETECTED. The SARS-CoV-2 RNA is generally detectable in upper and lower respiratory specimens during the acute phase of infection. The lowest concentration of SARS-CoV-2 viral copies this assay can detect is 250 copies / mL. A negative result does not preclude SARS-CoV-2 infection and should not be used as the sole basis for treatment or other patient management decisions.  A negative result may occur with improper specimen collection / handling, submission of specimen other than nasopharyngeal swab, presence of viral mutation(s) within the areas targeted by this assay, and inadequate number  of viral copies (<250 copies / mL). A negative result must be combined with clinical observations, patient history, and epidemiological information. Fact Sheet for Patients:   StrictlyIdeas.no Fact Sheet for Healthcare Providers: BankingDealers.co.za This test is not yet approved or cleared  by the [place] FDA and has been authorized for detection and/or diagnosis of SARS-CoV-2 by FDA under an Emergency Use Authorization (EUA).  This EUA will remain in effect (meaning this test can be used) for the duration of the COVID-19 declaration under Section 564(b)(1) of the Act, 21 U.S.C. section 360bbb-3(b)(1), unless the authorization is terminated or revoked sooner. Performed at [facility], [address], [place], [place] [address]      Time coordinating discharge: 35 minutes  SIGNED:   Louellen Molder, MD  [facility] 04/30/2020, 12:57 PM Pager   If 7PM-7AM, please contact night-coverage [email or web address] Password [email or web address]

## 2020-04-30 NOTE — Consult Note (Signed)
Consultation Note Date: 04/30/2020   [name]  DOB: 11-02-1946  MRN: [id number]  Age / Sex: 74 y.o., female  PCP: Phyllis Harrier, MD Referring Physician: Louellen Molder, MD  Reason for Consultation: Establishing goals of care  HPI/Patient Profile: 74 y.o. female  with past medical history of ESRD on HD, CHF, COPD, T2DM, and HTN admitted on 04/27/2020 with shortness of breath.  She was found to be hypoxic. Suspect shortness of breath d/t CHF exacerbation and mod-severe AS. Also possibility of COPD exacerbation. She has had multiple hospitalizations recently. Symptoms have now improved and she is maintaining sats on room air - plans for discharge today. PMT consulted for [name].  Clinical Assessment and Goals of Care: I have reviewed medical records including EPIC notes, labs and imaging, received report from RN, assessed the patient and then met with patient to discuss diagnosis prognosis, GOC, EOL wishes, disposition and options.  Patient is able to participate in [place] conversation independently. She has a caregiver, Phyllis [name], at the bedside.   I introduced Palliative Medicine as specialized medical care for people living with serious illness. It focuses on providing relief from the symptoms and stress of a serious illness. The goal is to improve quality of life for both the patient and the family. Ms Phyllis [name] is familiar - she has met with [place] multiple times in the past and has seen outpatient palliative care.   [name] tells me she has been doing well at home - able to ambulate with walker and care for herself - needs some assistance with dressing and household chores. She has had a good appetite.   We discussed patient's current illness and what it means in the larger context of patient's on-going co-morbidities. I attempted to elicit values and goals of care important to the patient.    Patient remains interested  in continuing current level of care - would want to come back to hospital as needed, wants to continue HD.  She has set limits of no feeding tube and no tracheostomy - these are documented in her ACP documents in EPIC. We discuss code status - [name] has received CPR and would want it again if needed. She is okay with short -term intubation but never trach.   We discuss HCPOA - this is clearly documented in EPIC and remains accurate - her friends 1. Phyllis [name] and 2. Phyllis [name] are her decision makers if she were ever unable.   Discussed with patient the importance of continued conversation with her decision makers and the medical providers regarding overall plan of care and treatment options, ensuring decisions are within the context of the patient's values and GOCs.    [name] is agreeable to continuing outpatient palliative.  Questions and concerns were addressed. The family was encouraged to call with questions or concerns.   Primary Decision Maker PATIENT  HCPOA documents list 1. Phyllis [name] and 2. Phyllis [name] if [name] were unable to make decisions for herself.   SUMMARY OF RECOMMENDATIONS   - full code/full scope - continue HD - continue outpatient palliative - would never want feeding tube or trach  Code Status/Advance Care Planning:  Full code  Discharge Planning: Home with Palliative Services      Primary Diagnoses: Present on Admission: . Acute on chronic diastolic (congestive) heart failure ([place])   I have reviewed the medical record, interviewed the patient and family, and examined the patient. The following aspects are pertinent.  Past Medical History:  Diagnosis  Date  . (HFpEF) heart failure with preserved ejection fraction ([place])    a. TTE 01/2014: nl LV sys fxn, no valvular abnormalities; b. TTE 11/16: nl EF, mild LVH;  c. 04/2019 Echo: EF 60-65%. DD. Nl RV fxn. Mod AS. 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  . Allergy   . Anemia of  chronic disease   . Anxiety   . Aortic atherosclerosis ([place])   . Asthma   . Chronic back pain   . COPD (chronic obstructive pulmonary disease) ([place])   . Diabetes mellitus with complication ([place])   . ESRD on hemodialysis ([place])    a. Tues/Sat; b. 2/2 small kidneys  . Essential hypertension   . Fistula    lower left arm  . GERD (gastroesophageal reflux disease)   . Gout   . History of exercise stress test    a. 01/2014: no evidence of ischemia; b. Lexiscan 08/2015: no sig ischemia, severe GI uptake artifact, low risk; c. CPET @ [facility] 09/2016: exercised 3 min 12 sec on bike without incline, 2.28 METs, VO2 of 8.1, 48% of predicted, indicating mod to sev functional impairment, evidence of blunted HR, stroke volume, and BP augmentation as well as ventilation-perfusion mismatch with exercise  . HLD (hyperlipidemia)   . Non-obstructive Carotid arterial disease ([place])    a. 12/2017: <50% bilat ICA dzs.  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 Severe aortic stenosis    a. by 09/2019 echo b. 04/2019 Echo: Mod AS.  Marland Kitchen Sleep apnea    Social History   Socioeconomic History  . Marital status: Single    Spouse name: Not on file  . Number of children: Not on file  . Years of education: Not on file  . Highest education level: Not on file  Occupational History  . Not on file  Tobacco Use  . Smoking status: Never Smoker  . Smokeless tobacco: Never Used  Vaping Use  . Vaping Use: Never used  Substance and Sexual Activity  . Alcohol use: No  . Drug use: No  . Sexual activity: Not Currently  Other Topics Concern  . Not on file  Social History Narrative  . Not on file   Social Determinants of Health   Financial Resource Strain:   . Difficulty of Paying Living Expenses:   Food Insecurity:   . Worried About Charity fundraiser in the Last Year:   . Arboriculturist in the Last Year:   Transportation Needs:   . Film/video editor (Medical):   Marland Kitchen Lack of Transportation  (Non-Medical):   Physical Activity:   . Days of Exercise per Week:   . Minutes of Exercise per Session:   Stress:   . Feeling of Stress :   Social Connections:   . Frequency of Communication with Friends and Family:   . Frequency of Social Gatherings with Friends and Family:   . Attends Religious Services:   . Active Member of Clubs or Organizations:   . Attends Archivist Meetings:   Marland Kitchen Marital Status:    Family History  Problem Relation Age of Onset  . Hypertension Mother   . Hyperlipidemia Mother   . Heart disease Father   . Heart attack Father 49  . Hypertension Father   . Hyperlipidemia Father   . Heart disease Brother        CABG   . Heart attack Brother   . Breast cancer  Neg Hx    Scheduled Meds: . aspirin EC  81 mg Oral Daily  . benzonatate  100 mg Oral TID  . calcium acetate  1,334 mg Oral TID WC  . clopidogrel  75 mg Oral Daily  . epoetin (EPOGEN/PROCRIT) injection  10,000 Units Intravenous Q M,W,F-HD  . furosemide  60 mg Intravenous Q12H  . heparin  5,000 Units Subcutaneous Q8H  . hydrALAZINE  50 mg Oral TID  . iron polysaccharides  150 mg Oral Daily  . lidocaine  1 patch Transdermal Q24H  . loratadine  10 mg Oral Daily  . melatonin  5 mg Oral QHS  . mirtazapine  15 mg Oral QHS  . oxybutynin  15 mg Oral Daily  . pravastatin  40 mg Oral q1800  . sertraline  50 mg Oral QHS  . sodium chloride flush  3 mL Intravenous Q12H   Continuous Infusions: . sodium chloride     PRN Meds:.sodium chloride, acetaminophen, albuterol, ALPRAZolam, fluticasone, HYDROcodone-acetaminophen, ipratropium-albuterol, ondansetron (ZOFRAN) IV, sodium chloride flush, zolpidem Allergies  Allergen Reactions  . Enalapril Maleate Other (See Comments)    Other reaction(s): Headache  . Nitrofurantoin Swelling and Rash    Other Reaction: swelling of body  . Sulfamethoxazole-Trimethoprim Swelling  . 2,4-D Dimethylamine (Amisol) Rash and Other (See Comments)    Other Reaction:  h/a  . Baclofen Other (See Comments) and Nausea Only    lightheadness ,drowsiness , muscle weakness , twitching in hands   . Neosporin [Neomycin-Bacitracin Zn-Polymyx] Other (See Comments) and Rash    Other Reaction: irritation Skin irritation   . Quinine Nausea And Vomiting, Rash and Other (See Comments)    Other Reaction: Vomiting, rash, h/a, vision  . Ultram [Tramadol] Palpitations  . Zocor [Simvastatin] Other (See Comments) and Rash    Other Reaction: muscle spasms Muscle pain and spasms  . Bactrim [Sulfamethoxazole-Trimethoprim] Swelling  . Levodopa Other (See Comments)    Reaction: unknown  . Macrodantin [Nitrofurantoin Macrocrystal] Swelling  . Quinine Derivatives Other (See Comments)    Vertigo,nausea vomiting blurred vision headache ears sensitivity    Review of Systems  All other systems reviewed and are negative.   Physical Exam Pulmonary:     Effort: Pulmonary effort is normal.  Skin:    General: Skin is warm and dry.  Neurological:     Mental Status: She is alert and oriented to person, place, and time.     Vital Signs: BP (!) 140/42 (BP Location: Right Arm)   Pulse 70   Temp 98.3 F (36.8 C)   Resp 18   Ht 4' 10"  (1.473 m)   Wt 55.9 kg   SpO2 99%   BMI 25.78 kg/m  Pain Scale: 0-10 POSS *See Group Information*: 1-Acceptable,Awake and alert Pain Score: 0-No pain   SpO2: SpO2: 99 % O2 Device:SpO2: 99 % O2 Flow Rate: .O2 Flow Rate (L/min): 2 L/min  IO: Intake/output summary:   Intake/Output Summary (Last 24 hours) at 04/30/2020 1241 Last data filed at 04/30/2020 0950 Gross per 24 hour  Intake 743 ml  Output 200 ml  Net 543 ml    LBM:   Baseline Weight: Weight: 56.2 kg Most recent weight: Weight: 55.9 kg     Palliative Assessment/Data: PPS 50%    Time Total: 50 minutes Greater than 50%  of this time was spent counseling and coordinating care related to the above assessment and plan.  Juel Burrow, DNP, AGNP-C Palliative Medicine  Team [phone] Pager: [phone]

## 2020-04-30 NOTE — Progress Notes (Signed)
Pt discharged home with caregiver Magdalene River, discharge instruction given and explained to pt and caregiver. Both denies any questions. PIV x2 removed from right hand. VSS. Pt denies any pain or needs. Pt taken out in wheelchair.

## 2020-04-30 NOTE — Progress Notes (Signed)
[facility]  ROUNDING NOTE   Subjective:   Hemodialysis treatment yesterday. Tolerated treatment well. UF of 2.5 liters.  Off oxygen this morning. Home health aide is at bedside.   Objective:  Vital signs in last 24 hours:  Temp:  [98 F (36.7 C)-98.7 F (37.1 C)] 98.4 F (36.9 C) (06/11 0751) Pulse Rate:  [52-72] 69 (06/11 0751) Resp:  [11-25] 18 (06/11 0751) BP: (102-139)/(34-42) 136/42 (06/11 0751) SpO2:  [100 %] 100 % (06/11 0751) Weight:  [55.9 kg-56.8 kg] 55.9 kg (06/11 0504)  Weight change: 0.744 kg Filed Weights   04/29/20 0901 04/29/20 1316 04/30/20 0504  Weight: 56.4 kg 56.8 kg 55.9 kg    Intake/Output: I/O last 3 completed shifts: In: 386 [I.V.:16; Blood:370] Out: 2500 [Other:2500]   Intake/Output this shift:  Total I/O In: 360 [P.O.:360] Out: 200 [Urine:200]  Physical Exam: General: No acute distress  Head: Normocephalic, atraumatic. Moist oral mucosal membranes  Eyes: Anicteric  Neck: Supple, trachea midline  Lungs:  Clear to auscultation, normal effort   Heart: regular, 2/6 systolic ejection murmur  Abdomen:  Soft, nontender, bowel sounds present  Extremities: No peripheral edema.  Neurologic: Awake, alert, following commands  Skin: No lesions  Access: Left upper extremity AV fistula    Basic Metabolic Panel: Recent Labs  Lab 04/27/20 1918 04/27/20 1918 04/28/20 0457 04/28/20 0457 04/28/20 1622 04/29/20 0556 04/30/20 0641  NA 140  --  140  --  139 138 138  K 4.0  --  4.3  --  4.2 4.9 4.5  CL 100  --  104  --  98 99 98  CO2 29  --  25  --  29 28 27   GLUCOSE 154*  --  160*  --  154* 79 77  BUN 34*  --  42*  --  53* 60* 23  CREATININE 3.99*  --  4.31*  --  5.61* 6.57* 4.19*  CALCIUM 8.3*   < > 7.7*   < > 8.4* 8.2* 8.6*  PHOS  --   --   --   --  3.4  --   --    < > = values in this interval not displayed.    Liver Function Tests: Recent Labs  Lab 04/27/20 1918 04/28/20 1622  AST 18  --   ALT 13  --   ALKPHOS 101   --   BILITOT 0.5  --   PROT 6.3*  --   ALBUMIN 3.9 3.4*   No results for input(s): LIPASE, AMYLASE in the last 168 hours. No results for input(s): AMMONIA in the last 168 hours.  CBC: Recent Labs  Lab 04/27/20 1918 04/27/20 1918 04/28/20 0457 04/29/20 0556 04/29/20 1230 04/29/20 1552 04/30/20 0641  WBC 7.3  --  4.9 5.6 10.1  --  6.0  NEUTROABS  --   --  4.3 3.8  --   --  3.9  HGB 8.0*   < > 7.0* 6.0* 7.9* 6.7* 8.3*  HCT 23.5*   < > 20.5* 17.8* 24.2* 20.4* 25.1*  MCV 88.7  --  89.1 90.8 91.0  --  93.3  PLT 149*  --  121* 108* 164  --  136*   < > = values in this interval not displayed.    Cardiac Enzymes: No results for input(s): CKTOTAL, CKMB, CKMBINDEX, TROPONINI in the last 168 hours.  BNP: Invalid input(s): POCBNP  CBG: No results for input(s): GLUCAP in the last 168 hours.  Microbiology: Results for orders  placed or performed during the hospital encounter of 04/27/20  SARS Coronavirus 2 by RT PCR (hospital order, performed in [facility] hospital lab) Nasopharyngeal Nasopharyngeal Swab     Status: None   Collection Time: 04/27/20  8:30 PM   Specimen: Nasopharyngeal Swab  Result Value Ref Range Status   SARS Coronavirus 2 NEGATIVE NEGATIVE Final    Comment: (NOTE) SARS-CoV-2 target nucleic acids are NOT DETECTED. The SARS-CoV-2 RNA is generally detectable in upper and lower respiratory specimens during the acute phase of infection. The lowest concentration of SARS-CoV-2 viral copies this assay can detect is 250 copies / mL. A negative result does not preclude SARS-CoV-2 infection and should not be used as the sole basis for treatment or other patient management decisions.  A negative result may occur with improper specimen collection / handling, submission of specimen other than nasopharyngeal swab, presence of viral mutation(s) within the areas targeted by this assay, and inadequate number of viral copies (<250 copies / mL). A negative result must be combined  with clinical observations, patient history, and epidemiological information. Fact Sheet for Patients:   StrictlyIdeas.no Fact Sheet for Healthcare Providers: BankingDealers.co.za This test is not yet approved or cleared  by the [place] FDA and has been authorized for detection and/or diagnosis of SARS-CoV-2 by FDA under an Emergency Use Authorization (EUA).  This EUA will remain in effect (meaning this test can be used) for the duration of the COVID-19 declaration under Section 564(b)(1) of the Act, 21 U.S.C. section 360bbb-3(b)(1), unless the authorization is terminated or revoked sooner. Performed at [facility], [address] [place], [place] [address]     Coagulation Studies: No results for input(s): LABPROT, INR in the last 72 hours.  Urinalysis: No results for input(s): COLORURINE, LABSPEC, PHURINE, GLUCOSEU, HGBUR, BILIRUBINUR, KETONESUR, PROTEINUR, UROBILINOGEN, NITRITE, LEUKOCYTESUR in the last 72 hours.  Invalid input(s): APPERANCEUR    Imaging: CT ANGIO CHEST PE W OR WO CONTRAST  Result Date: 04/28/2020 CLINICAL DATA:  Respiratory failure. Shortness of breath. Dialysis patient post treatment today. Recent hospital admission and discharge for dyspnea and pulmonary edema. EXAM: CT ANGIOGRAPHY CHEST WITH CONTRAST TECHNIQUE: Multidetector CT imaging of the chest was performed using the standard protocol during bolus administration of intravenous contrast. Multiplanar CT image reconstructions and MIPs were obtained to evaluate the vascular anatomy. CONTRAST:  60mL OMNIPAQUE IOHEXOL 350 MG/ML SOLN COMPARISON:  Radiograph yesterday. Chest CT 09/19/2019 FINDINGS: Cardiovascular: There are no filling defects within the pulmonary arteries to suggest pulmonary embolus. Dense aortic atherosclerosis without dissection. Aortic tortuosity. No aneurysm. Cardiomegaly with coronary artery calcifications. No pericardial fluid.  Mediastinum/Nodes: No enlarged mediastinal or hilar lymph nodes. No esophageal wall thickening. 13 mm left thyroid nodule, does not meet size criteria for further evaluation or follow-up. Lungs/Pleura: Slight heterogeneous pulmonary parenchyma. Subpleural atelectasis in the right lower lobe. There is a 9 mm area of ground-glass nodularity in the left lower lobe, series 6, image 55. No septal thickening/pulmonary edema. No pleural fluid. Upper Abdomen: Ingested material within the stomach. Pancreatic tail lesion measuring 17 mm, grossly unchanged from prior. Inferior calcifications again seen. Cystic renal disease partially included. Musculoskeletal: There are no acute or suspicious osseous abnormalities. Review of the MIP images confirms the above findings. IMPRESSION: 1. No pulmonary embolus. 2. Slight heterogeneous pulmonary parenchyma, can be seen with small vessel or small airways disease. 3. A 9 mm area of ground-glass nodularity in the left lower lobe is likely infectious or inflammatory. This is new from October 2020  exam. Consider treatment like ground-glass nodule. Initial follow-up with CT at 6-12 months is recommended to confirm persistence. If persistent, repeat CT is recommended every 2 years until 5 years of stability has been established. This recommendation follows the consensus statement: Guidelines for Management of Incidental Pulmonary Nodules Detected on CT Images: From the Fleischner Society 2017; Radiology 2017; 284:228-243. 4. Cardiomegaly with coronary artery calcifications. Aortic Atherosclerosis (ICD10-I70.0). Electronically Signed   By: Keith Rake M.D.   On: 04/28/2020 19:17     Medications:   . sodium chloride     . aspirin EC  81 mg Oral Daily  . benzonatate  100 mg Oral TID  . calcium acetate  1,334 mg Oral TID WC  . carvedilol  12.5 mg Oral BID WC  . clopidogrel  75 mg Oral Daily  . epoetin (EPOGEN/PROCRIT) injection  10,000 Units Intravenous Q M,W,F-HD  .  furosemide  60 mg Intravenous Q12H  . heparin  5,000 Units Subcutaneous Q8H  . hydrALAZINE  50 mg Oral TID  . iron polysaccharides  150 mg Oral Daily  . lidocaine  1 patch Transdermal Q24H  . loratadine  10 mg Oral Daily  . melatonin  5 mg Oral QHS  . mirtazapine  15 mg Oral QHS  . oxybutynin  15 mg Oral Daily  . pravastatin  40 mg Oral q1800  . sertraline  50 mg Oral QHS  . sodium chloride flush  3 mL Intravenous Q12H   sodium chloride, acetaminophen, albuterol, ALPRAZolam, fluticasone, HYDROcodone-acetaminophen, ipratropium-albuterol, ondansetron (ZOFRAN) IV, sodium chloride flush, zolpidem  Assessment/ Plan:   [name] is a 74 y.o. white female with end stage renal disease on hemodialysis, aortic stenosis, hypertension, COPD, diabetes mellitus type 2, overactive bladder, gout, GERD, chronic costochondritis admitted on 04/27/2020 to [facility] for Acute respiratory failure ([place]) [J96.00] SOB (shortness of breath) [R06.02] Acute on chronic diastolic (congestive) heart failure ([facility]) [I50.33] Congestive heart failure, unspecified HF chronicity, unspecified heart failure type ([place]) [I50.9]   [facility] TTS [facility] [address] Left AV fistula 56kg  1.  ESRD on HD TTS.   Dialysis for tomorrow. There is consideration of four days a week of dialysis.   2.  Anemia of chronic kidney disease.  PRBC transfusion with HD treatment  - EPO with HD treatment  3. Hypertension: Home regimen of carvedilol, amlodipine, furosemide, hydralazine, losartan.  - Decrease dose of carvedilol.   4.  Secondary hyperparathyroidism - Calcium acetate with meals.    LOS: 3 Bassem Bernasconi 6/11/202110:57 AM

## 2020-05-01 LAB — TYPE AND SCREEN
ABO/RH(D): O POS
Antibody Screen: NEGATIVE
Donor AG Type: NEGATIVE
Unit division: 0
Unit division: 0
Unit division: 0

## 2020-05-01 LAB — BPAM RBC
Blood Product Expiration Date: 202107052359
Blood Product Expiration Date: 202107082359
Blood Product Expiration Date: 202107082359
ISSUE DATE / TIME: 202106101716
Unit Type and Rh: 5100
Unit Type and Rh: 5100
Unit Type and Rh: 5100

## 2020-05-01 NOTE — Progress Notes (Deleted)
Patient ID: Phyllis [name], female    DOB: 01-20-1946, 74 y.o.   MRN: [id number]  HPI  Phyllis [name] is a 74 y/o female with a history of asthma, DM, HTN, CKD, hyperlipidemia, COPD, obstructive sleep apnea, GERD, gout, anemia, anxiety and ESRD on dialysis.   Echo report from 04/20/20 reviewed and showed an EF of 60-65% along with mild LAE and moderate/severe AS. Echo report from 12/27/17 reviewed and showed an EF of 55-60% along with mild Phyllis, mild/moderate AS and a normal PA pressure.   Catheterization done 10/14/2019 showed:  Severe, calcified ostial to distal left main, 80%.  Severe ostial LAD, 99% followed by an eccentric 50 to 70% proximal LAD after the first diagonal.  Ostial to proximal 60% circumflex.  The second obtuse marginal contains segmental 60 to 70% narrowing.  The circumflex is dominant.  Nondominant RCA.  Moderately severe to severe aortic stenosis with valve area of 0.99 cm, peak to peak gradient 20 mmHg, mean gradient 28 mmHg, based upon cardiac output of 7.2 L/min and corresponding cardiac index of 4.83 L/min/m  Severe ascending and descending aortic calcification  Admitted   She presents today for a follow-up visit with a chief complaint of    Past Medical History:  Diagnosis Date  . (HFpEF) heart failure with preserved ejection fraction ([place])    a. TTE 01/2014: nl LV sys fxn, no valvular abnormalities; b. TTE 11/16: nl EF, mild LVH;  c. 04/2019 Echo: EF 60-65%. DD. Nl RV fxn. Mod AS. 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  . Allergy   . Anemia of chronic disease   . Anxiety   . Aortic atherosclerosis ([place])   . Asthma   . Chronic back pain   . COPD (chronic obstructive pulmonary disease) ([place])   . Diabetes mellitus with complication ([place])   . ESRD on hemodialysis ([place])    a. Tues/Sat; b. 2/2 small kidneys  . Essential hypertension   . Fistula    lower left arm  . GERD (gastroesophageal reflux disease)   . Gout   . History of exercise  stress test    a. 01/2014: no evidence of ischemia; b. Lexiscan 08/2015: no sig ischemia, severe GI uptake artifact, low risk; c. CPET @ [facility] 09/2016: exercised 3 min 12 sec on bike without incline, 2.28 METs, VO2 of 8.1, 48% of predicted, indicating mod to sev functional impairment, evidence of blunted HR, stroke volume, and BP augmentation as well as ventilation-perfusion mismatch with exercise  . HLD (hyperlipidemia)   . Non-obstructive Carotid arterial disease ([place])    a. 12/2017: <50% bilat ICA dzs.  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 Severe aortic stenosis    a. by 09/2019 echo b. 04/2019 Echo: Mod AS.  Marland Kitchen Sleep apnea    Past Surgical History:  Procedure Laterality Date  . CARDIAC CATHETERIZATION    . carpel tunnel    . GALLBLADDER SURGERY    . PERIPHERAL VASCULAR CATHETERIZATION N/A 04/12/2015   Procedure: A/V Shuntogram/Fistulagram;  Surgeon: Algernon Huxley, MD;  Location: [place] [facility];  Service: Cardiovascular;  Laterality: N/A;  . PERIPHERAL VASCULAR CATHETERIZATION N/A 04/12/2015   Procedure: A/V Shunt Intervention;  Surgeon: Algernon Huxley, MD;  Location: [facility];  Service: Cardiovascular;  Laterality: N/A;  . PERIPHERAL VASCULAR CATHETERIZATION N/A 06/09/2015   Procedure: Dialysis/Perma Catheter Removal;  Surgeon: Katha Cabal, MD;  Location: [facility];  Service:  Cardiovascular;  Laterality: N/A;  . RIGHT/LEFT HEART CATH AND CORONARY ANGIOGRAPHY N/A 10/14/2019   Procedure: RIGHT/LEFT HEART CATH AND CORONARY ANGIOGRAPHY;  Surgeon: Belva Crome, MD;  Location: [facility];  Service: Cardiovascular;  Laterality: N/A;   Family History  Problem Relation Age of Onset  . Hypertension Mother   . Hyperlipidemia Mother   . Heart disease Father   . Heart attack Father 43  . Hypertension Father   . Hyperlipidemia Father   . Heart disease Brother        CABG   . Heart attack Brother   . Breast cancer Neg Hx    Social  History   Tobacco Use  . Smoking status: Never Smoker  . Smokeless tobacco: Never Used  Substance Use Topics  . Alcohol use: No   Allergies  Allergen Reactions  . Enalapril Maleate Other (See Comments)    Other reaction(s): Headache  . Nitrofurantoin Swelling and Rash    Other Reaction: swelling of body  . Sulfamethoxazole-Trimethoprim Swelling  . 2,4-D Dimethylamine (Amisol) Rash and Other (See Comments)    Other Reaction: h/a  . Baclofen Other (See Comments) and Nausea Only    lightheadness ,drowsiness , muscle weakness , twitching in hands   . Neosporin [Neomycin-Bacitracin Zn-Polymyx] Other (See Comments) and Rash    Other Reaction: irritation Skin irritation   . Quinine Nausea And Vomiting, Rash and Other (See Comments)    Other Reaction: Vomiting, rash, h/a, vision  . Ultram [Tramadol] Palpitations  . Zocor [Simvastatin] Other (See Comments) and Rash    Other Reaction: muscle spasms Muscle pain and spasms  . Bactrim [Sulfamethoxazole-Trimethoprim] Swelling  . Levodopa Other (See Comments)    Reaction: unknown  . Macrodantin [Nitrofurantoin Macrocrystal] Swelling  . Quinine Derivatives Other (See Comments)    Vertigo,nausea vomiting blurred vision headache ears sensitivity      Review of Systems  Constitutional: Positive for fatigue. Negative for appetite change.  HENT: Positive for congestion. Negative for postnasal drip and sore throat.   Eyes: Negative.   Respiratory: Positive for chest tightness. Negative for shortness of breath.   Cardiovascular: Negative for chest pain, palpitations and leg swelling.  Gastrointestinal: Negative for abdominal distention and abdominal pain.  Endocrine: Negative.   Genitourinary: Negative.   Musculoskeletal: Positive for neck pain. Negative for back pain.  Skin: Negative.   Allergic/Immunologic: Negative.   Neurological: Negative for dizziness and light-headedness.  Hematological: Negative for adenopathy. Does not  bruise/bleed easily.  Psychiatric/Behavioral: Negative for dysphoric mood and sleep disturbance (sleeping in recliner due to difficulty getting in her tall bed). The patient is not nervous/anxious.      Physical Exam Vitals and nursing note reviewed.  Constitutional:      Appearance: Normal appearance.  HENT:     Head: Normocephalic and atraumatic.  Cardiovascular:     Rate and Rhythm: Normal rate and regular rhythm.  Pulmonary:     Effort: Pulmonary effort is normal.     Breath sounds: Normal breath sounds. No rales.  Abdominal:     General: There is no distension.     Palpations: Abdomen is soft.  Musculoskeletal:        General: No swelling or tenderness.     Cervical back: Normal range of motion and neck supple.  Skin:    General: Skin is warm and dry.  Neurological:     General: No focal deficit present.     Mental Status: She is alert and oriented to person,  place, and time.  Psychiatric:        Mood and Affect: Mood normal.        Behavior: Behavior normal.    Assessment & Plan:  1: Chronic heart failure with preserved ejection fraction- - NYHA class III - euvolemic today - weighing daily and she was reminded to call for an overnight weight gain of >2 pounds or a weekly weight gain of >5 pounds - wight stable from last visit here 2 months ago - not adding salt but hasn't been reading food labels much. Reviewed the importance of closely following a 2000mg  sodium diet  - she says that she's received her flu vaccine for this season - saw cardiology Rockey Situ) 12/12/17 - BNP 11/28/2018 was 1283.0  2: HTN- - BP mildly elevated today - saw PCP ([facility]) 10/30/18 - BMP from 12/21/2018 reviewed and showed sodium 135, potassium 3.1, creatinine 3.65 and GFR 12  3: ESRD- - dialysis on Tuesday and Saturday - missed dialysis on Saturday due to the weather; scheduled for tomorrow - saw vascular 10/16/18  Patient did not bring her medications nor a list. Each medication was  verbally reviewed with the patient and she was encouraged to bring the bottles to every visit to confirm accuracy of list.  Return in 6 months or sooner for any questions/problems before then.

## 2020-05-03 ENCOUNTER — Ambulatory Visit: Payer: Medicare Other | Admitting: Family

## 2020-05-30 NOTE — Progress Notes (Deleted)
Patient ID: Phyllis [name], female    DOB: Jan 03, 1946, 74 y.o.   MRN: [id number]  HPI  Phyllis [name] is a 74 y/o female with a history of asthma, DM, HTN, CKD, hyperlipidemia, COPD, obstructive sleep apnea, GERD, gout, anemia, anxiety and ESRD on dialysis.   Echo report from 04/20/20 reviewed and showed an EF of 60-65% along with mild LAE and moderate/severe AS. Echo report from 12/27/17 reviewed and showed an EF of 55-60% along with mild Phyllis, mild/moderate AS and a normal PA pressure.   Catheterization done 10/14/2019 showed:  Severe, calcified ostial to distal left main, 80%.  Severe ostial LAD, 99% followed by an eccentric 50 to 70% proximal LAD after the first diagonal.  Ostial to proximal 60% circumflex.  The second obtuse marginal contains segmental 60 to 70% narrowing.  The circumflex is dominant.  Nondominant RCA.  Moderately severe to severe aortic stenosis with valve area of 0.99 cm, peak to peak gradient 20 mmHg, mean gradient 28 mmHg, based upon cardiac output of 7.2 L/min and corresponding cardiac index of 4.83 L/min/m  Severe ascending and descending aortic calcification  Admitted 04/27/20 due to acute on chronic HF. Palliative care consult obtained. Initially needed CPAP and then transitioned to bipap. Given IV lasix and hemodialysis. Carvedilol decreased. Given 1 unit of PRBC's. Discharged after 3 days.   She presents today for a follow-up visit with a chief complaint of    Past Medical History:  Diagnosis Date  . (HFpEF) heart failure with preserved ejection fraction ([place])    a. TTE 01/2014: nl LV sys fxn, no valvular abnormalities; b. TTE 11/16: nl EF, mild LVH;  c. 04/2019 Echo: EF 60-65%. DD. Nl RV fxn. Mod AS. 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  . Allergy   . Anemia of chronic disease   . Anxiety   . Aortic atherosclerosis ([place])   . Asthma   . Chronic back pain   . COPD (chronic obstructive pulmonary disease) ([place])   . Diabetes mellitus with  complication ([place])   . ESRD on hemodialysis ([place])    a. Tues/Sat; b. 2/2 small kidneys  . Essential hypertension   . Fistula    lower left arm  . GERD (gastroesophageal reflux disease)   . Gout   . History of exercise stress test    a. 01/2014: no evidence of ischemia; b. Lexiscan 08/2015: no sig ischemia, severe GI uptake artifact, low risk; c. CPET @ [facility] 09/2016: exercised 3 min 12 sec on bike without incline, 2.28 METs, VO2 of 8.1, 48% of predicted, indicating mod to sev functional impairment, evidence of blunted HR, stroke volume, and BP augmentation as well as ventilation-perfusion mismatch with exercise  . HLD (hyperlipidemia)   . Non-obstructive Carotid arterial disease ([place])    a. 12/2017: <50% bilat ICA dzs.  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 Severe aortic stenosis    a. by 09/2019 echo b. 04/2019 Echo: Mod AS.  Marland Kitchen Sleep apnea    Past Surgical History:  Procedure Laterality Date  . CARDIAC CATHETERIZATION    . carpel tunnel    . GALLBLADDER SURGERY    . PERIPHERAL VASCULAR CATHETERIZATION N/A 04/12/2015   Procedure: A/V Shuntogram/Fistulagram;  Surgeon: Algernon Huxley, MD;  Location: [facility];  Service: Cardiovascular;  Laterality: N/A;  . PERIPHERAL VASCULAR CATHETERIZATION N/A 04/12/2015   Procedure: A/V Shunt Intervention;  Surgeon: Algernon Huxley, MD;  Location: [facility];  Service: Cardiovascular;  Laterality: N/A;  . PERIPHERAL VASCULAR CATHETERIZATION N/A 06/09/2015   Procedure: Dialysis/Perma Catheter Removal;  Surgeon: Katha Cabal, MD;  Location: [facility];  Service: Cardiovascular;  Laterality: N/A;  . RIGHT/LEFT HEART CATH AND CORONARY ANGIOGRAPHY N/A 10/14/2019   Procedure: RIGHT/LEFT HEART CATH AND CORONARY ANGIOGRAPHY;  Surgeon: Belva Crome, MD;  Location: [facility];  Service: Cardiovascular;  Laterality: N/A;   Family History  Problem Relation Age of Onset  . Hypertension Mother   .  Hyperlipidemia Mother   . Heart disease Father   . Heart attack Father 55  . Hypertension Father   . Hyperlipidemia Father   . Heart disease Brother        CABG   . Heart attack Brother   . Breast cancer Neg Hx    Social History   Tobacco Use  . Smoking status: Never Smoker  . Smokeless tobacco: Never Used  Substance Use Topics  . Alcohol use: No   Allergies  Allergen Reactions  . Enalapril Maleate Other (See Comments)    Other reaction(s): Headache  . Nitrofurantoin Swelling and Rash    Other Reaction: swelling of body  . Sulfamethoxazole-Trimethoprim Swelling  . 2,4-D Dimethylamine (Amisol) Rash and Other (See Comments)    Other Reaction: h/a  . Baclofen Other (See Comments) and Nausea Only    lightheadness ,drowsiness , muscle weakness , twitching in hands   . Neosporin [Neomycin-Bacitracin Zn-Polymyx] Other (See Comments) and Rash    Other Reaction: irritation Skin irritation   . Quinine Nausea And Vomiting, Rash and Other (See Comments)    Other Reaction: Vomiting, rash, h/a, vision  . Ultram [Tramadol] Palpitations  . Zocor [Simvastatin] Other (See Comments) and Rash    Other Reaction: muscle spasms Muscle pain and spasms  . Bactrim [Sulfamethoxazole-Trimethoprim] Swelling  . Levodopa Other (See Comments)    Reaction: unknown  . Macrodantin [Nitrofurantoin Macrocrystal] Swelling  . Quinine Derivatives Other (See Comments)    Vertigo,nausea vomiting blurred vision headache ears sensitivity      Review of Systems  Constitutional: Positive for fatigue. Negative for appetite change.  HENT: Positive for congestion. Negative for postnasal drip and sore throat.   Eyes: Negative.   Respiratory: Positive for chest tightness. Negative for shortness of breath.   Cardiovascular: Negative for chest pain, palpitations and leg swelling.  Gastrointestinal: Negative for abdominal distention and abdominal pain.  Endocrine: Negative.   Genitourinary: Negative.    Musculoskeletal: Positive for neck pain. Negative for back pain.  Skin: Negative.   Allergic/Immunologic: Negative.   Neurological: Negative for dizziness and light-headedness.  Hematological: Negative for adenopathy. Does not bruise/bleed easily.  Psychiatric/Behavioral: Negative for dysphoric mood and sleep disturbance (sleeping in recliner due to difficulty getting in her tall bed). The patient is not nervous/anxious.      Physical Exam Vitals and nursing note reviewed.  Constitutional:      Appearance: Normal appearance.  HENT:     Head: Normocephalic and atraumatic.  Cardiovascular:     Rate and Rhythm: Normal rate and regular rhythm.  Pulmonary:     Effort: Pulmonary effort is normal.     Breath sounds: Normal breath sounds. No rales.  Abdominal:     General: There is no distension.     Palpations: Abdomen is soft.  Musculoskeletal:        General: No swelling or tenderness.     Cervical back: Normal range of motion and neck supple.  Skin:    General: Skin is warm and dry.  Neurological:     General: No focal deficit present.     Mental Status: She is alert and oriented to person, place, and time.  Psychiatric:        Mood and Affect: Mood normal.        Behavior: Behavior normal.    Assessment & Plan:  1: Chronic heart failure with preserved ejection fraction- - NYHA class III - euvolemic today - weighing daily and she was reminded to call for an overnight weight gain of >2 pounds or a weekly weight gain of >5 pounds - not adding salt but hasn't been reading food labels much. Reviewed the importance of closely following a 2000mg  sodium diet  - saw cardiology Rockey Situ) 12/19/19 - BNP 04/27/20 was 556.6  2: HTN- - BP  - saw PCP ([facility]) 05/03/20 - BMP from 04/30/20 reviewed and showed sodium 138, potassium 4.5, creatinine 4.19 and GFR 10  3: ESRD- - dialysis on Tuesday and Saturday - saw vascular 10/16/18   Patient did not bring her medications nor a list. Each  medication was verbally reviewed with the patient and she was encouraged to bring the bottles to every visit to confirm accuracy of list.

## 2020-05-31 ENCOUNTER — Telehealth: Payer: Self-pay | Admitting: Family

## 2020-05-31 ENCOUNTER — Ambulatory Visit: Payer: Medicare Other | Admitting: Family

## 2020-05-31 NOTE — Telephone Encounter (Signed)
Patient did not show for her [facility] appointment on 05/31/20. Will attempt to reschedule.

## 2020-06-26 NOTE — Progress Notes (Signed)
Cardiology Office Note  Date:  06/28/2020   ID:  [name], DOB 1946-02-14, MRN [id number]  PCP:  [name], MD   Chief Complaint  Patient presents with  . Other    6 month follow up. MEds reviewed verbally with patient.     HPI:  [name] is a very pleasant 74 year old woman with history of  chronic renal failure, on hemodialysis 3 days per week  diffuse aortic atherosclerosis seen on CT scan 2014,  obesity,  hypertension,  hyperlipidemia  Aortic valve stenosis, who presents for follow-up of her hypertension, atherosclerosis, episodes of chest pain.   Last seen in clinic January 2021  Echocardiogram June 2021 normal LV function EF greater than 60%  Moderate to severe aortic valve stenosis mean gradient 23 mmHg Peak velocity 3.33 m/s Estimated aortic valve area 0.78 cm  No dramatic change compared to February 2019  On HD two days a week , tues , Thursday and Sat Has had several admissions to the hospital for pulmonary edema Hemodialysis time has been extended to 3 hours Every other week, does 4x a week With a higher HD regiment, reports no recent trips to the hospital  Holds BP meds before HD Blood pressure still running low typically 998 systolic Thinks her weight is higher, eating more, not from fluid, denies significant leg swelling Blood pressure lower since adding extra day of hemodialysis  EKG personally reviewed by myself on todays visit Shows normal sinus rhythm with rate 83 bpm no significant ST-T wave changes  Other past medical history reviewed  Stress test 09/2017 Showing no significant ischemia Normal wall motion, EF estimated at 52% Low risk scan   Previous CT abd in 2014: Diffuse athero in aorta  admission to the hospital 12/04/2014 for acute respiratory failure with hypoxia, found to have acute on chronic renal failure, 25 pound weight gain  AV fistula was not working on the left and temporary dialysis catheter was  placed and dialysis performed 3 in the hospital.  stress test 02/12/2014 showing no ischemia echocardiogram March 2015 showing normal LV function, no significant valve problems  PMH:   has a past medical history of (HFpEF) heart failure with preserved ejection fraction ([place]), Allergy, Anemia of chronic disease, Anxiety, Aortic atherosclerosis ([place]), Asthma, Chronic back pain, COPD (chronic obstructive pulmonary disease) ([place]), Diabetes mellitus with complication ([place]), ESRD on hemodialysis ([place]), Essential hypertension, Fistula, GERD (gastroesophageal reflux disease), Gout, History of exercise stress test, HLD (hyperlipidemia), Non-obstructive Carotid arterial disease ([place]), 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, Severe aortic stenosis, and Sleep apnea.  PSH:    Past Surgical History:  Procedure Laterality Date  . CARDIAC CATHETERIZATION    . carpel tunnel    . GALLBLADDER SURGERY    . PERIPHERAL VASCULAR CATHETERIZATION N/A 04/12/2015   Procedure: A/V Shuntogram/Fistulagram;  Surgeon: Algernon Huxley, MD;  Location: [facility];  Service: Cardiovascular;  Laterality: N/A;  . PERIPHERAL VASCULAR CATHETERIZATION N/A 04/12/2015   Procedure: A/V Shunt Intervention;  Surgeon: Algernon Huxley, MD;  Location: [facility];  Service: Cardiovascular;  Laterality: N/A;  . PERIPHERAL VASCULAR CATHETERIZATION N/A 06/09/2015   Procedure: Dialysis/Perma Catheter Removal;  Surgeon: Katha Cabal, MD;  Location: [facility];  Service: Cardiovascular;  Laterality: N/A;  . RIGHT/LEFT HEART CATH AND CORONARY ANGIOGRAPHY N/A 10/14/2019   Procedure: RIGHT/LEFT HEART CATH AND CORONARY ANGIOGRAPHY;  Surgeon: Belva Crome, MD;  Location: [facility];  Service: Cardiovascular;  Laterality: N/A;  Current Outpatient Medications  Medication Sig Dispense Refill  . acetaminophen (TYLENOL) 500 MG tablet Take 1 tablet (500 mg total) by mouth every 6 (six) hours as needed for mild pain  or fever. 30 tablet 0  . albuterol (PROVENTIL HFA;VENTOLIN HFA) 108 (90 Base) MCG/ACT inhaler Inhale 2 puffs into the lungs every 6 (six) hours as needed for wheezing or shortness of breath. Out of medication    . amLODipine (NORVASC) 10 MG tablet Take 10 mg by mouth daily.     Marland Kitchen aspirin (ASPIRIN 81) 81 MG EC tablet Take 1 tablet by mouth daily.     . benzonatate (TESSALON) 100 MG capsule Take 100 mg by mouth 3 (three) times daily.    . budesonide-formoterol (SYMBICORT) 160-4.5 MCG/ACT inhaler Inhale 2 puffs into the lungs in the morning and at bedtime. 1 Inhaler 3  . calcium acetate (PHOSLO) 667 MG capsule Take 2 capsules by mouth in the morning, at noon, and at bedtime.    . carvedilol (COREG) 3.125 MG tablet Take 1 tablet (3.125 mg total) by mouth 2 (two) times daily with a meal. 60 tablet 0  . cetirizine (ZYRTEC) 10 MG tablet Take 10 mg by mouth daily as needed for allergies.     Marland Kitchen clopidogrel (PLAVIX) 75 MG tablet Take 1 tablet (75 mg total) by mouth daily. 30 tablet 0  . CVS MELATONIN 5 MG CHEW Chew 1 tablet by mouth at bedtime.    Marland Kitchen epoetin alfa (EPOGEN) 10000 UNIT/ML injection Inject 0.4 mLs (4,000 Units total) into the vein Every Tuesday,Thursday,and Saturday with dialysis. 1 mL   . furosemide (LASIX) 80 MG tablet Take 1 tablet (80 mg total) by mouth daily. 30 tablet 11  . hydrALAZINE (APRESOLINE) 50 MG tablet TAKE 1 TABLET BY MOUTH EVERY 8 HOURS (Patient taking differently: Take 50 mg by mouth 3 (three) times daily. ) 90 tablet 3  . HYDROcodone-acetaminophen (NORCO) 7.5-325 MG tablet Take 1 tablet by mouth every 6 (six) hours as needed (pain). (Patient taking differently: Take 1-2 tablets by mouth daily as needed (pain). ) 12 tablet 0  . ipratropium-albuterol (DUONEB) 0.5-2.5 (3) MG/3ML SOLN Take 3 mLs by nebulization every 6 (six) hours as needed. 360 mL   . iron polysaccharides (NIFEREX) 150 MG capsule Take 150 mg by mouth daily.    Marland Kitchen lidocaine (LIDODERM) 5 % Place 1 patch onto the skin  daily. Remove & Discard patch within 12 hours or as directed by MD 30 patch 0  . lidocaine-prilocaine (EMLA) cream Apply 1 application topically as needed (prior to treatment).     . losartan (COZAAR) 50 MG tablet Take 50 mg by mouth daily.    . mirtazapine (REMERON) 15 MG tablet Take 15 mg by mouth at bedtime.    . mometasone (NASONEX) 50 MCG/ACT nasal spray Place 2 sprays into the nose daily as needed (allergies).     Marland Kitchen oxybutynin (DITROPAN XL) 15 MG 24 hr tablet Take 15 mg by mouth daily.    . pravastatin (PRAVACHOL) 40 MG tablet Take 40 mg by mouth at bedtime.     . sertraline (ZOLOFT) 50 MG tablet Take 50 mg by mouth at bedtime.    . topiramate (TOPAMAX) 25 MG tablet Take 1 tablet (25 mg total) by mouth as needed (for headaches).     No current facility-administered medications for this visit.     Allergies:   Enalapril maleate; Nitrofurantoin; Sulfamethoxazole-trimethoprim; 2,4-d dimethylamine (amisol); Baclofen; Neosporin [neomycin-bacitracin zn-polymyx]; Quinine; Ultram [tramadol]; Zocor [simvastatin]; Bactrim [  sulfamethoxazole-trimethoprim]; Levodopa; Macrodantin [nitrofurantoin macrocrystal]; and Quinine derivatives   Social History:  The patient  reports that she has never smoked. She has never used smokeless tobacco. She reports that she does not drink alcohol and does not use drugs.   Family History:   family history includes Heart attack in her brother; Heart attack (age of onset: 40) in her father; Heart disease in her brother and father; Hyperlipidemia in her father and mother; Hypertension in her father and mother.    Review of Systems: Review of Systems  Constitutional: Negative.   HENT: Negative.   Respiratory: Negative.   Cardiovascular: Negative.   Gastrointestinal: Negative.   Musculoskeletal:       Gait instability  Neurological: Negative.   Psychiatric/Behavioral: Negative.   All other systems reviewed and are negative.   PHYSICAL EXAM: VS:  BP (!) 120/50  (BP Location: Right Arm, Patient Position: Sitting, Cuff Size: Normal)   Pulse 83   Ht 4\' 10"  (1.473 m)   Wt 130 lb (59 kg)   SpO2 97%   BMI 27.17 kg/m  , BMI Body mass index is 27.17 kg/m. Constitutional:  oriented to person, place, and time. No distress.  HENT:  Head: Grossly normal Eyes:  no discharge. No scleral icterus.  Neck: No JVD, no carotid bruits  Cardiovascular: Regular rate and rhythm, no murmurs appreciated Pulmonary/Chest: Clear to auscultation bilaterally, no wheezes or rails Abdominal: Soft.  no distension.  no tenderness.  Musculoskeletal: Normal range of motion Neurological:  normal muscle tone. Coordination normal. No atrophy Skin: Skin warm and dry Psychiatric: normal affect, pleasant  Recent Labs: 10/21/2019: Magnesium 2.2 04/27/2020: ALT 13; B Natriuretic Peptide 556.6 04/30/2020: BUN 23; Creatinine, Ser 4.19; Hemoglobin 8.3; Platelets 136; Potassium 4.5; Sodium 138    Lipid Panel Lab Results  Component Value Date   CHOL 180 01/17/2018   HDL 48 01/17/2018   LDLCALC 101 (H) 01/17/2018   TRIG 154 (H) 01/17/2018      Wt Readings from Last 3 Encounters:  06/28/20 130 lb (59 kg)  04/30/20 123 lb 5.2 oz (55.9 kg)  04/22/20 123 lb 14.4 oz (56.2 kg)       ASSESSMENT AND PLAN:  Chronic diastolic CHF (congestive heart failure) (HCC) - Blood pressure lower likely from adding extra day of hemodialysis 160 systolic today, 109 at home Reports it is running low with dialysis Recommend she cut amlodipine down to 5 mg daily with extra 5 mg as needed for high blood pressure  Hyperlipidemia, unspecified hyperlipidemia type - Continue pravastatin Goal LDL less than 70  SOB (shortness of breath) - Plan: EKG 12-Lead Prior stress test with no ischemia Symptoms better with extra day of dialysis  End stage renal disease ([place]) - Plan: EKG 12-Lead Has hemodialysis 3 days per week  Extra day every other week Improved symptoms of shortness of breath and CHF  symptoms resolved Managed by Dr. Holley Raring  Dependence on hemodialysis [facility]) - Plan: EKG 12-Lead Details as above Extra hemodialysis every other week  Type 2 diabetes mellitus with other specified complication, unspecified long term insulin use status ([place]) - Plan: EKG 12-Lead Hemoglobin A1c well controlled, 4.5 in 04/2019  Aortic valve stenosis Moderate repeat echo one year Discussed with her various treatment options if aortic valve disease progresses    Total encounter time more than 25 minutes  Greater than 50% was spent in counseling and coordination of care with the patient   Disposition:   F/U  6 months   Orders  Placed This Encounter  Procedures  . EKG 12-Lead     Signed, Esmond Plants, M.D., Ph.D. 06/28/2020  [place], [place]

## 2020-06-28 ENCOUNTER — Other Ambulatory Visit: Payer: Self-pay

## 2020-06-28 ENCOUNTER — Encounter: Payer: Self-pay | Admitting: Cardiovascular Disease

## 2020-06-28 ENCOUNTER — Ambulatory Visit (INDEPENDENT_AMBULATORY_CARE_PROVIDER_SITE_OTHER): Payer: Medicare Other | Admitting: Cardiovascular Disease

## 2020-06-28 VITALS — BP 120/50 | HR 83 | Ht <= 58 in | Wt 130.0 lb

## 2020-06-28 DIAGNOSIS — N186 End stage renal disease: Secondary | ICD-10-CM

## 2020-06-28 DIAGNOSIS — I350 Nonrheumatic aortic (valve) stenosis: Secondary | ICD-10-CM | POA: Diagnosis not present

## 2020-06-28 DIAGNOSIS — I5032 Chronic diastolic (congestive) heart failure: Secondary | ICD-10-CM | POA: Diagnosis not present

## 2020-06-28 DIAGNOSIS — I10 Essential (primary) hypertension: Secondary | ICD-10-CM

## 2020-06-28 DIAGNOSIS — I700 Atherosclerosis of aorta: Secondary | ICD-10-CM

## 2020-06-28 DIAGNOSIS — Z992 Dependence on renal dialysis: Secondary | ICD-10-CM

## 2020-06-28 DIAGNOSIS — E782 Mixed hyperlipidemia: Secondary | ICD-10-CM

## 2020-06-28 MED ORDER — AMLODIPINE BESYLATE 5 MG PO TABS: 5.0000 mg | ORAL_TABLET | ORAL | 3 refills | Status: DC

## 2020-06-28 NOTE — Patient Instructions (Addendum)
Medication Instructions:  Please decrease the amlodipine down to 5 mg daily Take extra amlodipine 5 mg as needed for pressure >140  If you need a refill on your cardiac medications before your next appointment, please call your pharmacy.    Lab work: No new labs needed   If you have labs (blood work) drawn today and your tests are completely normal, you will receive your results only by: Marland Kitchen MyChart Message (if you have MyChart) OR . A paper copy in the mail If you have any lab test that is abnormal or we need to change your treatment, we will call you to review the results.   Testing/Procedures: Echo in 1 year for aortic valve stenosis.   Your physician has requested that you have an echocardiogram. Echocardiography is a painless test that uses sound waves to create images of your heart. It provides your doctor with information about the size and shape of your heart and how well your heart's chambers and valves are working. This procedure takes approximately one hour. There are no restrictions for this procedure.     Follow-Up: At [facility], you and your health needs are our priority.  As part of our continuing mission to provide you with exceptional heart care, we have created designated Provider Care Teams.  These Care Teams include your primary Cardiologist (physician) and Advanced Practice Providers (APPs -  Physician Assistants and Nurse Practitioners) who all work together to provide you with the care you need, when you need it.  . You will need a follow up appointment in 6 months   . Providers on your designated Care Team:   . Murray Hodgkins, NP . Christell Faith, PA-C . Marrianne Mood, PA-C  Any Other Special Instructions Will Be Listed Below (If Applicable).  For educational health videos Log in to : [email or web address] Or : [email or web address], password : [email or web address]

## 2020-06-29 ENCOUNTER — Other Ambulatory Visit: Payer: Self-pay | Admitting: Cardiovascular Disease

## 2020-07-09 ENCOUNTER — Telehealth: Payer: Self-pay | Admitting: Adult Health Nurse Practitioner

## 2020-07-09 NOTE — Telephone Encounter (Signed)
Spoke with patient.  She states feeling fine and defers visit at this time.  Has agreed to check-in in a couple of months. Roneka Gilpin K. Olena Heckle NP

## 2020-08-05 ENCOUNTER — Encounter: Payer: Self-pay | Admitting: Emergency Medicine

## 2020-08-05 ENCOUNTER — Other Ambulatory Visit: Payer: Self-pay

## 2020-08-05 ENCOUNTER — Emergency Department: Payer: Medicare Other

## 2020-08-05 ENCOUNTER — Emergency Department
Admission: EM | Admit: 2020-08-05 | Discharge: 2020-08-05 | Disposition: A | Discharge status: Home / Self Care | Payer: Medicare Other | Attending: Emergency Medicine | Admitting: Emergency Medicine

## 2020-08-05 DIAGNOSIS — R0602 Shortness of breath: Secondary | ICD-10-CM | POA: Diagnosis not present

## 2020-08-05 DIAGNOSIS — Z992 Dependence on renal dialysis: Secondary | ICD-10-CM | POA: Insufficient documentation

## 2020-08-05 DIAGNOSIS — Z5321 Procedure and treatment not carried out due to patient leaving prior to being seen by health care provider: Secondary | ICD-10-CM | POA: Diagnosis not present

## 2020-08-05 LAB — COMPREHENSIVE METABOLIC PANEL
ALT: 13 U/L (ref 0–44)
AST: 18 U/L (ref 15–41)
Albumin: 3.9 g/dL (ref 3.5–5.0)
Alkaline Phosphatase: 127 U/L — ABNORMAL HIGH (ref 38–126)
Anion gap: 12 (ref 5–15)
BUN: 41 mg/dL — ABNORMAL HIGH (ref 8–23)
CO2: 34 mmol/L — ABNORMAL HIGH (ref 22–32)
Calcium: 9 mg/dL (ref 8.9–10.3)
Chloride: 95 mmol/L — ABNORMAL LOW (ref 98–111)
Creatinine, Ser: 5.36 mg/dL — ABNORMAL HIGH (ref 0.44–1.00)
GFR calc Af Amer: 8 mL/min — ABNORMAL LOW (ref >60)
GFR calc non Af Amer: 7 mL/min — ABNORMAL LOW (ref >60)
Glucose, Bld: 96 mg/dL (ref 70–99)
Potassium: 5.7 mmol/L — ABNORMAL HIGH (ref 3.5–5.1)
Sodium: 141 mmol/L (ref 135–145)
Total Bilirubin: 0.7 mg/dL (ref 0.3–1.2)
Total Protein: 6.5 g/dL (ref 6.5–8.1)

## 2020-08-05 LAB — CBC WITH DIFFERENTIAL/PLATELET
Abs Immature Granulocytes: 0.03 10*3/uL (ref 0.00–0.07)
Basophils Absolute: 0 10*3/uL (ref 0.0–0.1)
Basophils Relative: 0 %
Eosinophils Absolute: 0.3 10*3/uL (ref 0.0–0.5)
Eosinophils Relative: 4 %
HCT: 32.8 % — ABNORMAL LOW (ref 36.0–46.0)
Hemoglobin: 10.4 g/dL — ABNORMAL LOW (ref 12.0–15.0)
Immature Granulocytes: 0 %
Lymphocytes Relative: 11 %
Lymphs Abs: 0.8 10*3/uL (ref 0.7–4.0)
MCH: 29 pg (ref 26.0–34.0)
MCHC: 31.7 g/dL (ref 30.0–36.0)
MCV: 91.4 fL (ref 80.0–100.0)
Monocytes Absolute: 0.5 10*3/uL (ref 0.1–1.0)
Monocytes Relative: 7 %
Neutro Abs: 5.2 10*3/uL (ref 1.7–7.7)
Neutrophils Relative %: 78 %
Platelets: 149 10*3/uL — ABNORMAL LOW (ref 150–400)
RBC: 3.59 MIL/uL — ABNORMAL LOW (ref 3.87–5.11)
RDW: 14.4 % (ref 11.5–15.5)
WBC: 6.7 10*3/uL (ref 4.0–10.5)
nRBC: 0 % (ref 0.0–0.2)

## 2020-08-05 LAB — BRAIN NATRIURETIC PEPTIDE: B Natriuretic Peptide: 1101.7 pg/mL — ABNORMAL HIGH (ref 0.0–100.0)

## 2020-08-05 NOTE — ED Notes (Signed)
Pt informed front desk staff they were leaving.

## 2020-08-05 NOTE — ED Triage Notes (Signed)
Pt in via EMS from home.  Pt had dialysis Monday and is supposed to have it today but started to feel SOB. Pt 92% RA, placed on 2L and now 96%.  174/96, 70'sHR

## 2020-08-06 ENCOUNTER — Telehealth: Payer: Self-pay | Admitting: Emergency Medicine

## 2020-08-06 NOTE — Telephone Encounter (Signed)
Called patient due to lwot to inquire about condition and follow up plans. Says she has gone to dialysis and she feels better.  I told her the xray of chest was not normal.  She says she has been told it was pneumonia before and it is just fluid.   She denies any fever or cough.  She will follow up with her doctor.

## 2020-08-28 ENCOUNTER — Inpatient Hospital Stay
Admission: EM | Admit: 2020-08-28 | Discharge: 2020-09-03 | DRG: 190 | Disposition: A | Discharge status: Home Health | Payer: Medicare Other | Attending: Obstetrics and Gynecology | Admitting: Obstetrics and Gynecology

## 2020-08-28 ENCOUNTER — Emergency Department: Payer: Medicare Other

## 2020-08-28 DIAGNOSIS — J9621 Acute and chronic respiratory failure with hypoxia: Secondary | ICD-10-CM | POA: Diagnosis present

## 2020-08-28 DIAGNOSIS — Z8673 Personal history of transient ischemic attack (TIA), and cerebral infarction without residual deficits: Secondary | ICD-10-CM

## 2020-08-28 DIAGNOSIS — F418 Other specified anxiety disorders: Secondary | ICD-10-CM | POA: Diagnosis present

## 2020-08-28 DIAGNOSIS — J810 Acute pulmonary edema: Secondary | ICD-10-CM

## 2020-08-28 DIAGNOSIS — I25118 Atherosclerotic heart disease of native coronary artery with other forms of angina pectoris: Secondary | ICD-10-CM | POA: Diagnosis present

## 2020-08-28 DIAGNOSIS — D631 Anemia in chronic kidney disease: Secondary | ICD-10-CM | POA: Diagnosis present

## 2020-08-28 DIAGNOSIS — R0602 Shortness of breath: Secondary | ICD-10-CM

## 2020-08-28 DIAGNOSIS — I350 Nonrheumatic aortic (valve) stenosis: Secondary | ICD-10-CM | POA: Diagnosis present

## 2020-08-28 DIAGNOSIS — M109 Gout, unspecified: Secondary | ICD-10-CM | POA: Diagnosis present

## 2020-08-28 DIAGNOSIS — Z8249 Family history of ischemic heart disease and other diseases of the circulatory system: Secondary | ICD-10-CM

## 2020-08-28 DIAGNOSIS — Z79899 Other long term (current) drug therapy: Secondary | ICD-10-CM

## 2020-08-28 DIAGNOSIS — N186 End stage renal disease: Secondary | ICD-10-CM | POA: Diagnosis not present

## 2020-08-28 DIAGNOSIS — E1122 Type 2 diabetes mellitus with diabetic chronic kidney disease: Secondary | ICD-10-CM | POA: Diagnosis present

## 2020-08-28 DIAGNOSIS — J441 Chronic obstructive pulmonary disease with (acute) exacerbation: Secondary | ICD-10-CM | POA: Diagnosis not present

## 2020-08-28 DIAGNOSIS — I10 Essential (primary) hypertension: Secondary | ICD-10-CM | POA: Diagnosis present

## 2020-08-28 DIAGNOSIS — I639 Cerebral infarction, unspecified: Secondary | ICD-10-CM | POA: Diagnosis present

## 2020-08-28 DIAGNOSIS — Z882 Allergy status to sulfonamides status: Secondary | ICD-10-CM

## 2020-08-28 DIAGNOSIS — R0902 Hypoxemia: Secondary | ICD-10-CM

## 2020-08-28 DIAGNOSIS — K219 Gastro-esophageal reflux disease without esophagitis: Secondary | ICD-10-CM | POA: Diagnosis present

## 2020-08-28 DIAGNOSIS — I5032 Chronic diastolic (congestive) heart failure: Secondary | ICD-10-CM | POA: Diagnosis not present

## 2020-08-28 DIAGNOSIS — Z20822 Contact with and (suspected) exposure to covid-19: Secondary | ICD-10-CM | POA: Diagnosis present

## 2020-08-28 DIAGNOSIS — J9601 Acute respiratory failure with hypoxia: Secondary | ICD-10-CM | POA: Diagnosis present

## 2020-08-28 DIAGNOSIS — Z7982 Long term (current) use of aspirin: Secondary | ICD-10-CM

## 2020-08-28 DIAGNOSIS — E785 Hyperlipidemia, unspecified: Secondary | ICD-10-CM | POA: Diagnosis present

## 2020-08-28 DIAGNOSIS — E875 Hyperkalemia: Secondary | ICD-10-CM | POA: Diagnosis not present

## 2020-08-28 DIAGNOSIS — Z83438 Family history of other disorder of lipoprotein metabolism and other lipidemia: Secondary | ICD-10-CM

## 2020-08-28 DIAGNOSIS — I214 Non-ST elevation (NSTEMI) myocardial infarction: Secondary | ICD-10-CM | POA: Diagnosis present

## 2020-08-28 DIAGNOSIS — I700 Atherosclerosis of aorta: Secondary | ICD-10-CM | POA: Diagnosis present

## 2020-08-28 DIAGNOSIS — G8929 Other chronic pain: Secondary | ICD-10-CM | POA: Diagnosis present

## 2020-08-28 DIAGNOSIS — E1129 Type 2 diabetes mellitus with other diabetic kidney complication: Secondary | ICD-10-CM | POA: Diagnosis present

## 2020-08-28 DIAGNOSIS — Z992 Dependence on renal dialysis: Secondary | ICD-10-CM

## 2020-08-28 DIAGNOSIS — Z888 Allergy status to other drugs, medicaments and biological substances status: Secondary | ICD-10-CM

## 2020-08-28 DIAGNOSIS — I4510 Unspecified right bundle-branch block: Secondary | ICD-10-CM | POA: Diagnosis not present

## 2020-08-28 DIAGNOSIS — I132 Hypertensive heart and chronic kidney disease with heart failure and with stage 5 chronic kidney disease, or end stage renal disease: Secondary | ICD-10-CM | POA: Diagnosis present

## 2020-08-28 DIAGNOSIS — N2581 Secondary hyperparathyroidism of renal origin: Secondary | ICD-10-CM | POA: Diagnosis present

## 2020-08-28 DIAGNOSIS — Z515 Encounter for palliative care: Secondary | ICD-10-CM

## 2020-08-28 DIAGNOSIS — M940 Chondrocostal junction syndrome [Tietze]: Secondary | ICD-10-CM | POA: Diagnosis present

## 2020-08-28 DIAGNOSIS — Z79890 Hormone replacement therapy (postmenopausal): Secondary | ICD-10-CM

## 2020-08-28 LAB — CBC WITH DIFFERENTIAL/PLATELET
Abs Immature Granulocytes: 0.08 10*3/uL — ABNORMAL HIGH (ref 0.00–0.07)
Basophils Absolute: 0.1 10*3/uL (ref 0.0–0.1)
Basophils Relative: 0 %
Eosinophils Absolute: 0.6 10*3/uL — ABNORMAL HIGH (ref 0.0–0.5)
Eosinophils Relative: 5 %
HCT: 30.7 % — ABNORMAL LOW (ref 36.0–46.0)
Hemoglobin: 10.1 g/dL — ABNORMAL LOW (ref 12.0–15.0)
Immature Granulocytes: 1 %
Lymphocytes Relative: 12 %
Lymphs Abs: 1.4 10*3/uL (ref 0.7–4.0)
MCH: 29.3 pg (ref 26.0–34.0)
MCHC: 32.9 g/dL (ref 30.0–36.0)
MCV: 89 fL (ref 80.0–100.0)
Monocytes Absolute: 0.7 10*3/uL (ref 0.1–1.0)
Monocytes Relative: 6 %
Neutro Abs: 9.3 10*3/uL — ABNORMAL HIGH (ref 1.7–7.7)
Neutrophils Relative %: 76 %
Platelets: 208 10*3/uL (ref 150–400)
RBC: 3.45 MIL/uL — ABNORMAL LOW (ref 3.87–5.11)
RDW: 15.9 % — ABNORMAL HIGH (ref 11.5–15.5)
WBC: 12.2 10*3/uL — ABNORMAL HIGH (ref 4.0–10.5)
nRBC: 0 % (ref 0.0–0.2)

## 2020-08-28 LAB — COMPREHENSIVE METABOLIC PANEL
ALT: 12 U/L (ref 0–44)
AST: 17 U/L (ref 15–41)
Albumin: 4.1 g/dL (ref 3.5–5.0)
Alkaline Phosphatase: 118 U/L (ref 38–126)
Anion gap: 15 (ref 5–15)
BUN: 66 mg/dL — ABNORMAL HIGH (ref 8–23)
CO2: 25 mmol/L (ref 22–32)
Calcium: 8.5 mg/dL — ABNORMAL LOW (ref 8.9–10.3)
Chloride: 90 mmol/L — ABNORMAL LOW (ref 98–111)
Creatinine, Ser: 7 mg/dL — ABNORMAL HIGH (ref 0.44–1.00)
GFR, Estimated: 5 mL/min — ABNORMAL LOW (ref >60)
Glucose, Bld: 182 mg/dL — ABNORMAL HIGH (ref 70–99)
Potassium: 4.8 mmol/L (ref 3.5–5.1)
Sodium: 130 mmol/L — ABNORMAL LOW (ref 135–145)
Total Bilirubin: 0.8 mg/dL (ref 0.3–1.2)
Total Protein: 7 g/dL (ref 6.5–8.1)

## 2020-08-28 LAB — PROCALCITONIN: Procalcitonin: 0.24 ng/mL

## 2020-08-28 LAB — RESPIRATORY PANEL BY RT PCR (FLU A&B, COVID)
Influenza A by PCR: NEGATIVE
Influenza B by PCR: NEGATIVE
SARS Coronavirus 2 by RT PCR: NEGATIVE

## 2020-08-28 LAB — LACTIC ACID, PLASMA: Lactic Acid, Venous: 1.8 mmol/L (ref 0.5–1.9)

## 2020-08-28 LAB — PROTIME-INR
INR: 1 (ref 0.8–1.2)
Prothrombin Time: 13 seconds (ref 11.4–15.2)

## 2020-08-28 LAB — GLUCOSE, CAPILLARY: Glucose-Capillary: 205 mg/dL — ABNORMAL HIGH (ref 70–99)

## 2020-08-28 LAB — APTT: aPTT: 28 seconds (ref 24–36)

## 2020-08-28 LAB — TROPONIN I (HIGH SENSITIVITY): Troponin I (High Sensitivity): 32 ng/L — ABNORMAL HIGH (ref <18)

## 2020-08-28 LAB — BRAIN NATRIURETIC PEPTIDE: B Natriuretic Peptide: 697.1 pg/mL — ABNORMAL HIGH (ref 0.0–100.0)

## 2020-08-28 MED ORDER — ASPIRIN 81 MG PO TBEC: 81.0000 mg | DELAYED_RELEASE_TABLET | Freq: Every day | ORAL | Status: DC

## 2020-08-28 MED ORDER — ALBUTEROL SULFATE (2.5 MG/3ML) 0.083% IN NEBU
2.5000 mg | INHALATION_SOLUTION | RESPIRATORY_TRACT | Status: DC | PRN
  Administered 2020-08-29: 2.5 mg via RESPIRATORY_TRACT
  Filled 2020-08-28 (×3): qty 3

## 2020-08-28 MED ORDER — OXYBUTYNIN CHLORIDE ER 5 MG PO TB24
15.0000 mg | ORAL_TABLET | Freq: Every day | ORAL | Status: DC
  Administered 2020-08-29 – 2020-09-03 (×6): 15 mg via ORAL
  Filled 2020-08-28 (×7): qty 3

## 2020-08-28 MED ORDER — SODIUM CHLORIDE 0.9 % IV SOLN: 250.0000 mL | INTRAVENOUS | Status: DC | PRN

## 2020-08-28 MED ORDER — MIRTAZAPINE 15 MG PO TABS
15.0000 mg | ORAL_TABLET | Freq: Every day | ORAL | Status: DC
  Administered 2020-08-28 – 2020-09-02 (×6): 15 mg via ORAL
  Filled 2020-08-28 (×6): qty 1

## 2020-08-28 MED ORDER — ACETAMINOPHEN 325 MG PO TABS
650.0000 mg | ORAL_TABLET | Freq: Four times a day (QID) | ORAL | Status: DC | PRN
  Administered 2020-08-28 – 2020-08-30 (×2): 650 mg via ORAL
  Filled 2020-08-28 (×3): qty 2

## 2020-08-28 MED ORDER — CALCIUM ACETATE (PHOS BINDER) 667 MG PO CAPS
1334.0000 mg | ORAL_CAPSULE | Freq: Three times a day (TID) | ORAL | Status: DC
  Administered 2020-08-29 – 2020-09-03 (×16): 1334 mg via ORAL
  Filled 2020-08-28 (×20): qty 2

## 2020-08-28 MED ORDER — HYDRALAZINE HCL 50 MG PO TABS
50.0000 mg | ORAL_TABLET | Freq: Three times a day (TID) | ORAL | Status: DC
  Administered 2020-08-28 – 2020-08-29 (×4): 50 mg via ORAL
  Filled 2020-08-28 (×5): qty 1

## 2020-08-28 MED ORDER — HEPARIN SODIUM (PORCINE) 5000 UNIT/ML IJ SOLN
5000.0000 [IU] | Freq: Three times a day (TID) | INTRAMUSCULAR | Status: DC
  Administered 2020-08-28 – 2020-08-29 (×4): 5000 [IU] via SUBCUTANEOUS
  Filled 2020-08-28 (×4): qty 1

## 2020-08-28 MED ORDER — SODIUM CHLORIDE 0.9% FLUSH: 3.0000 mL | INTRAVENOUS | Status: DC | PRN

## 2020-08-28 MED ORDER — AZITHROMYCIN 500 MG PO TABS
500.0000 mg | ORAL_TABLET | Freq: Every day | ORAL | Status: AC
  Administered 2020-08-28: 500 mg via ORAL
  Filled 2020-08-28: qty 1

## 2020-08-28 MED ORDER — ONDANSETRON HCL 4 MG/2ML IJ SOLN
4.0000 mg | Freq: Three times a day (TID) | INTRAMUSCULAR | Status: DC | PRN
  Administered 2020-08-28: 4 mg via INTRAVENOUS
  Filled 2020-08-28: qty 2

## 2020-08-28 MED ORDER — SODIUM CHLORIDE 0.9% FLUSH
3.0000 mL | Freq: Two times a day (BID) | INTRAVENOUS | Status: DC
  Administered 2020-08-28 – 2020-09-03 (×10): 3 mL via INTRAVENOUS

## 2020-08-28 MED ORDER — LIDOCAINE 5 % EX PTCH
1.0000 | MEDICATED_PATCH | CUTANEOUS | Status: DC
  Administered 2020-08-29 – 2020-09-03 (×6): 1 via TRANSDERMAL
  Filled 2020-08-28 (×10): qty 1

## 2020-08-28 MED ORDER — LOSARTAN POTASSIUM 50 MG PO TABS
50.0000 mg | ORAL_TABLET | Freq: Every day | ORAL | Status: DC
  Administered 2020-08-28: 50 mg via ORAL
  Filled 2020-08-28 (×2): qty 1

## 2020-08-28 MED ORDER — FUROSEMIDE 40 MG PO TABS
80.0000 mg | ORAL_TABLET | Freq: Every day | ORAL | Status: DC
  Administered 2020-08-29 – 2020-09-03 (×6): 80 mg via ORAL
  Filled 2020-08-28: qty 2
  Filled 2020-08-28 (×2): qty 4
  Filled 2020-08-28 (×2): qty 2
  Filled 2020-08-28: qty 4
  Filled 2020-08-28: qty 2

## 2020-08-28 MED ORDER — CARVEDILOL 3.125 MG PO TABS
3.1250 mg | ORAL_TABLET | Freq: Two times a day (BID) | ORAL | Status: DC
  Administered 2020-08-29 – 2020-09-03 (×12): 3.125 mg via ORAL
  Filled 2020-08-28 (×11): qty 1

## 2020-08-28 MED ORDER — DM-GUAIFENESIN ER 30-600 MG PO TB12
1.0000 | ORAL_TABLET | Freq: Two times a day (BID) | ORAL | Status: DC | PRN
  Administered 2020-09-01 – 2020-09-03 (×3): 1 via ORAL
  Filled 2020-08-28 (×3): qty 1

## 2020-08-28 MED ORDER — IPRATROPIUM-ALBUTEROL 0.5-2.5 (3) MG/3ML IN SOLN
3.0000 mL | RESPIRATORY_TRACT | Status: DC
  Administered 2020-08-28 – 2020-08-29 (×6): 3 mL via RESPIRATORY_TRACT
  Filled 2020-08-28 (×5): qty 3

## 2020-08-28 MED ORDER — METHYLPREDNISOLONE SODIUM SUCC 40 MG IJ SOLR
40.0000 mg | Freq: Two times a day (BID) | INTRAMUSCULAR | Status: DC
  Administered 2020-08-28 – 2020-08-29 (×3): 40 mg via INTRAVENOUS
  Filled 2020-08-28 (×3): qty 1

## 2020-08-28 MED ORDER — HYDROCODONE-ACETAMINOPHEN 7.5-325 MG PO TABS
1.0000 | ORAL_TABLET | Freq: Four times a day (QID) | ORAL | Status: DC | PRN
  Administered 2020-08-28 – 2020-08-30 (×5): 1 via ORAL
  Filled 2020-08-28 (×5): qty 1

## 2020-08-28 MED ORDER — AMLODIPINE BESYLATE 5 MG PO TABS
5.0000 mg | ORAL_TABLET | Freq: Every day | ORAL | Status: DC
  Administered 2020-08-28 – 2020-08-30 (×3): 5 mg via ORAL
  Filled 2020-08-28 (×4): qty 1

## 2020-08-28 MED ORDER — HYDRALAZINE HCL 20 MG/ML IJ SOLN: 5.0000 mg | INTRAMUSCULAR | Status: DC | PRN

## 2020-08-28 MED ORDER — AZITHROMYCIN 250 MG PO TABS
250.0000 mg | ORAL_TABLET | Freq: Every day | ORAL | Status: AC
  Administered 2020-08-29 – 2020-09-01 (×4): 250 mg via ORAL
  Filled 2020-08-28 (×4): qty 1

## 2020-08-28 MED ORDER — PRAVASTATIN SODIUM 40 MG PO TABS
40.0000 mg | ORAL_TABLET | Freq: Every day | ORAL | Status: DC
  Administered 2020-08-28 – 2020-09-02 (×6): 40 mg via ORAL
  Filled 2020-08-28 (×2): qty 1
  Filled 2020-08-28 (×2): qty 2
  Filled 2020-08-28 (×2): qty 1

## 2020-08-28 MED ORDER — ASPIRIN EC 81 MG PO TBEC
81.0000 mg | DELAYED_RELEASE_TABLET | Freq: Every day | ORAL | Status: DC
  Administered 2020-08-29 – 2020-09-03 (×6): 81 mg via ORAL
  Filled 2020-08-28 (×6): qty 1

## 2020-08-28 MED ORDER — SERTRALINE HCL 50 MG PO TABS
50.0000 mg | ORAL_TABLET | Freq: Every day | ORAL | Status: DC
  Administered 2020-08-28 – 2020-09-02 (×6): 50 mg via ORAL
  Filled 2020-08-28 (×6): qty 1

## 2020-08-28 MED ORDER — LIDOCAINE-PRILOCAINE 2.5-2.5 % EX CREA
1.0000 "application " | TOPICAL_CREAM | CUTANEOUS | Status: DC | PRN
  Filled 2020-08-28 (×2): qty 5

## 2020-08-28 MED ORDER — MORPHINE SULFATE (PF) 2 MG/ML IV SOLN
2.0000 mg | Freq: Once | INTRAVENOUS | Status: AC
  Administered 2020-08-28: 2 mg via INTRAVENOUS
  Filled 2020-08-28: qty 1

## 2020-08-28 NOTE — ED Notes (Signed)
Pt presents with SOB and states this was sudden onset. Pt states she missed dialysis due to not feeling well and having SOB. Pt called EMS for this reason. Pt appears in resp distress, EMS states pt's RA was mid 60's on arrival and pt placed on bipap, RT placed pt on bipap as well, pt tolerating well. Pt is A&Ox4. Pt denies chest pain. Pt has wheezes and crackles throughout. Pt states she comes from home.

## 2020-08-28 NOTE — ED Notes (Signed)
Patient being trialed on 4L O2 nasal canula at this time per MD

## 2020-08-28 NOTE — ED Notes (Signed)
Pt roomed to 63 for dialysis purposes. Pt resting comfortably at this time. VSS. NO current distress noted.

## 2020-08-28 NOTE — ED Notes (Signed)
Dialysis at bedside

## 2020-08-28 NOTE — Progress Notes (Signed)
[facility]  ROUNDING NOTE   Subjective:   [name] was admitted to [facility] on 08/28/2020 for Acute on chronic respiratory failure with hypoxia ([place]) [J96.21] Placed on BIPAP.   Last hemodialysis treatment was 10/5. Missed Thursday's treatment.   Patient was scheduled for her regular outpatient dialysis today. She because short of breath with chest pain. Presented to the ED with hypoxic respiratory failure requiring noninvasive ventilation. Found to have pulmonary edema on CXR.   Placed on hemodialysis urgently. Seen and examined on hemodialysis treatment.     Objective:  Vital signs in last 24 hours:  Temp:  [97.2 F (36.2 C)] 97.2 F (36.2 C) (10/09 0952) Pulse Rate:  [65-99] 65 (10/09 1300) Resp:  [15-30] 15 (10/09 1300) BP: (132-164)/(51-61) 150/51 (10/09 1300) SpO2:  [95 %-100 %] 100 % (10/09 1300)  Weight change:  There were no vitals filed for this visit.  Intake/Output: No intake/output data recorded.   Intake/Output this shift:  No intake/output data recorded.  Physical Exam: General: Critically ill  Head: Normocephalic, atraumatic. Moist oral mucosal membranes  Eyes: Anicteric, PERRL  Neck: Supple, trachea midline  Lungs:  Crackles at bases bilaterally, +BIPAP  Heart: Regular rate and rhythm, +murmur  Abdomen:  Soft, nontender,   Extremities: no peripheral edema.  Neurologic: Nonfocal, moving all four extremities  Skin: No lesions  Access: Left AVF    Basic Metabolic Panel: Recent Labs  Lab 08/28/20 0958  NA 130*  K 4.8  CL 90*  CO2 25  GLUCOSE 182*  BUN 66*  CREATININE 7.00*  CALCIUM 8.5*    Liver Function Tests: Recent Labs  Lab 08/28/20 0958  AST 17  ALT 12  ALKPHOS 118  BILITOT 0.8  PROT 7.0  ALBUMIN 4.1   No results for input(s): LIPASE, AMYLASE in the last 168 hours. No results for input(s): AMMONIA in the last 168 hours.  CBC: Recent Labs  Lab 08/28/20 0958  WBC 12.2*  NEUTROABS 9.3*  HGB 10.1*   HCT 30.7*  MCV 89.0  PLT 208    Cardiac Enzymes: No results for input(s): CKTOTAL, CKMB, CKMBINDEX, TROPONINI in the last 168 hours.  BNP: Invalid input(s): POCBNP  CBG: No results for input(s): GLUCAP in the last 168 hours.  Microbiology: Results for orders placed or performed during the hospital encounter of 08/28/20  Respiratory Panel by RT PCR (Flu A&B, Covid) - Nasopharyngeal Swab     Status: None   Collection Time: 08/28/20  9:59 AM   Specimen: Nasopharyngeal Swab  Result Value Ref Range Status   SARS Coronavirus 2 by RT PCR NEGATIVE NEGATIVE Final    Comment: (NOTE) SARS-CoV-2 target nucleic acids are NOT DETECTED.  The SARS-CoV-2 RNA is generally detectable in upper respiratoy specimens during the acute phase of infection. The lowest concentration of SARS-CoV-2 viral copies this assay can detect is 131 copies/mL. A negative result does not preclude SARS-Cov-2 infection and should not be used as the sole basis for treatment or other patient management decisions. A negative result may occur with  improper specimen collection/handling, submission of specimen other than nasopharyngeal swab, presence of viral mutation(s) within the areas targeted by this assay, and inadequate number of viral copies (<131 copies/mL). A negative result must be combined with clinical observations, patient history, and epidemiological information. The expected result is Negative.  Fact Sheet for Patients:  PinkCheek.be  Fact Sheet for Healthcare Providers:  GravelBags.it  This test is no t yet approved or cleared by the [place]  States FDA and  has been authorized for detection and/or diagnosis of SARS-CoV-2 by FDA under an Emergency Use Authorization (EUA). This EUA will remain  in effect (meaning this test can be used) for the duration of the COVID-19 declaration under Section 564(b)(1) of the Act, 21 U.S.C. section  360bbb-3(b)(1), unless the authorization is terminated or revoked sooner.     Influenza A by PCR NEGATIVE NEGATIVE Final   Influenza B by PCR NEGATIVE NEGATIVE Final    Comment: (NOTE) The Xpert Xpress SARS-CoV-2/FLU/RSV assay is intended as an aid in  the diagnosis of influenza from Nasopharyngeal swab specimens and  should not be used as a sole basis for treatment. Nasal washings and  aspirates are unacceptable for Xpert Xpress SARS-CoV-2/FLU/RSV  testing.  Fact Sheet for Patients: PinkCheek.be  Fact Sheet for Healthcare Providers: GravelBags.it  This test is not yet approved or cleared by the [place] FDA and  has been authorized for detection and/or diagnosis of SARS-CoV-2 by  FDA under an Emergency Use Authorization (EUA). This EUA will remain  in effect (meaning this test can be used) for the duration of the  Covid-19 declaration under Section 564(b)(1) of the Act, 21  U.S.C. section 360bbb-3(b)(1), unless the authorization is  terminated or revoked. Performed at [facility], [address] [place], [place] [address]     Coagulation Studies: Recent Labs    08/28/20 0958  LABPROT 13.0  INR 1.0    Urinalysis: No results for input(s): COLORURINE, LABSPEC, PHURINE, GLUCOSEU, HGBUR, BILIRUBINUR, KETONESUR, PROTEINUR, UROBILINOGEN, NITRITE, LEUKOCYTESUR in the last 72 hours.  Invalid input(s): APPERANCEUR    Imaging: DG Chest Port 1 View  Result Date: 08/28/2020 CLINICAL DATA:  Sepsis.  Shortness of breath. EXAM: PORTABLE CHEST 1 VIEW COMPARISON:  08/05/2020 FINDINGS: Patient rotated right. Midline trachea. Mild cardiomegaly. Atherosclerosis in the transverse aorta. No pleural effusion or pneumothorax. Pulmonary interstitial prominence is slightly increased. Subtle right lower lobe opacity is again suspected. IMPRESSION: Interstitial thickening, felt to be slightly increased. Primarily the  sequelae of smoking/chronic bronchitis. Developing mild pulmonary venous congestion cannot be excluded. Lateral right lower lobe opacity, which could represent residual or recurrent pneumonia. Aortic Atherosclerosis (ICD10-I70.0). Electronically Signed   By: Abigail Miyamoto M.D.   On: 08/28/2020 10:40     Medications:   . sodium chloride     . [START ON 08/29/2020] aspirin EC  81 mg Oral Daily  . [START ON 08/29/2020] azithromycin  250 mg Oral Daily  . heparin  5,000 Units Subcutaneous Q8H  . ipratropium-albuterol  3 mL Nebulization Q4H  . methylPREDNISolone (SOLU-MEDROL) injection  40 mg Intravenous Q12H  . sodium chloride flush  3 mL Intravenous Q12H   sodium chloride, acetaminophen, albuterol, dextromethorphan-guaiFENesin, hydrALAZINE, ondansetron (ZOFRAN) IV, sodium chloride flush  Assessment/ Plan:  [name] is a 74 y.o. white female with end stage renal disease on hemodialysis, hypertension, aortic stenosis, COPD, diabetes mellitus type II, chronic costochrondritis who was admitted to [facility] on 08/28/2020 for Acute on chronic respiratory failure with hypoxia ([place]) [J96.21]  [facility] TTS [place] Left AVF 60kg  1. End Stage Renal Disease: seen and examined on emergent hemodialysis treatment.   2. Hypertension: 156/49. Holding home regimen of carvedilol, furosemide, amlodipine, losartan  3. Anemia with chronic kidney disease: hemoglobin 10.1.  - EPO as outpatient  4. Secondary Hyperparathyroidism:  - calcium acetate with meals.    LOS: 0 Naleah Kofoed 10/9/20212:13 PM

## 2020-08-28 NOTE — Progress Notes (Signed)
Dr. Juleen China at bedside at this time. RN informed MD of decrease in BFR due to increased arterial pressures and UF rate decreased per patient requesting to remove 2.5L, with MD stating agreement.

## 2020-08-28 NOTE — ED Provider Notes (Signed)
[facility] Emergency Department Provider Note   ____________________________________________   First MD Initiated Contact with Patient 08/28/20 [phone]     (approximate)  I have reviewed the triage vital signs and the nursing notes.   HISTORY  Chief Complaint Shortness of Breath EM caveat: Severe respiratory distress   HPI Phyllis [name] is a 74 y.o. female here for evaluation of shortness of breath   EMS reports patient due for dialysis today, became suddenly and severely short of breath.  She was placed on CPAP and Nitropaste placed on chest with some improvement in breathing.  Initially quite hypoxic and severely tachypneic She has improved her oxygen saturations and is tolerating CPAP well.  Patient able to tell me she has some chest tightness but severely short of breath.  Feels like there is fluid in her lungs  Sudden onset of symptoms and denies feeling sick or ill prior to the symptoms starting just a little bit earlier this morning  Past Medical History:  Diagnosis Date   (HFpEF) heart failure with preserved ejection fraction ([place])    a. TTE 01/2014: nl LV sys fxn, no valvular abnormalities; b. TTE 11/16: nl EF, mild LVH;  c. 04/2019 Echo: EF 60-65%. DD. Nl RV fxn. Mod AS. 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   Allergy    Anemia of chronic disease    Anxiety    Aortic atherosclerosis (HCC)    Asthma    Chronic back pain    COPD (chronic obstructive pulmonary disease) ([place])    Diabetes mellitus with complication ([place])    ESRD on hemodialysis ([place])    a. Tues/Sat; b. 2/2 small kidneys   Essential hypertension    Fistula    lower left arm   GERD (gastroesophageal reflux disease)    Gout    History of exercise stress test    a. 01/2014: no evidence of ischemia; b. Lexiscan 08/2015: no sig ischemia, severe GI uptake artifact, low risk; c. CPET @ [facility] 09/2016: exercised 3 min 12 sec on bike without incline,  2.28 METs, VO2 of 8.1, 48% of predicted, indicating mod to sev functional impairment, evidence of blunted HR, stroke volume, and BP augmentation as well as ventilation-perfusion mismatch with exercise   HLD (hyperlipidemia)    Non-obstructive Carotid arterial disease ([place])    a. 12/2017: <50% bilat ICA dzs.   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 by 09/2019 echo b. 04/2019 Echo: Mod AS.   Sleep apnea     Patient Active Problem List   Diagnosis Date Noted   Acute on chronic respiratory failure with hypoxia ([place]) 08/28/2020   Depression with anxiety 08/28/2020   Type II diabetes mellitus with renal manifestations ([place]) 08/28/2020   Acute on chronic diastolic (congestive) heart failure ([place]) 04/30/2020   COPD with chronic bronchitis ([place]) 04/30/2020   Dyspnea 04/20/2020   Asymptomatic bacteriuria 12/05/2019   COPD with acute exacerbation ([place]) 10/23/2019   Elevated troponin 10/23/2019   CAD (coronary artery disease) 10/23/2019   COPD exacerbation ([place]) 10/23/2019   Leukocytosis 10/23/2019   Anxiety 10/23/2019   Palliative care by specialist    Goals of care, counseling/discussion    Flash pulmonary edema ([place]) 10/14/2019   Respiratory failure with hypoxia ([place]) 10/11/2019   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-stage renal disease) ([place])    Severe aortic valve stenosis    Acute respiratory failure with hypoxemia ([place]) 09/20/2019  COPD with acute bronchitis ([place]) 09/05/2019   Congestive heart failure ([place]) 05/24/2019   Hyperkalemia 11/29/2018   Chronic diastolic heart failure ([place]) 11/02/2018   HTN (hypertension) 11/02/2018   Uncontrolled hypertension 10/21/2018   History of acute pulmonary edema 10/21/2018   Pressure injury of skin 03/16/2018   CVA (cerebral vascular accident) ([place]) 01/16/2018   Aortic atherosclerosis ([place]) 12/11/2017   Chest pain 09/18/2017   Hyperlipidemia 94/85/4627   Complication from  renal dialysis device 04/12/2015   SOB (shortness of breath) 02/01/2015   ESRD (end stage renal disease) on dialysis ([place]) 02/01/2015   Type 2 diabetes mellitus with other specified complication ([place]) 03/50/0938   Asthma 02/01/2015    Past Surgical History:  Procedure Laterality Date   CARDIAC CATHETERIZATION     carpel tunnel     GALLBLADDER SURGERY     PERIPHERAL VASCULAR CATHETERIZATION N/A 04/12/2015   Procedure: A/V Shuntogram/Fistulagram;  Surgeon: Algernon Huxley, MD;  Location: [place] [facility];  Service: Cardiovascular;  Laterality: N/A;   PERIPHERAL VASCULAR CATHETERIZATION N/A 04/12/2015   Procedure: A/V Shunt Intervention;  Surgeon: Algernon Huxley, MD;  Location: [place] [facility];  Service: Cardiovascular;  Laterality: N/A;   PERIPHERAL VASCULAR CATHETERIZATION N/A 06/09/2015   Procedure: Dialysis/Perma Catheter Removal;  Surgeon: Katha Cabal, MD;  Location: [place] [facility];  Service: Cardiovascular;  Laterality: N/A;   RIGHT/LEFT HEART CATH AND CORONARY ANGIOGRAPHY N/A 10/14/2019   Procedure: RIGHT/LEFT HEART CATH AND CORONARY ANGIOGRAPHY;  Surgeon: Belva Crome, MD;  Location: [place] [facility];  Service: Cardiovascular;  Laterality: N/A;    Prior to Admission medications   Medication Sig Start Date End Date Taking? Authorizing Provider  albuterol (PROVENTIL HFA;VENTOLIN HFA) 108 (90 Base) MCG/ACT inhaler Inhale 2 puffs into the lungs every 6 (six) hours as needed for wheezing or shortness of breath. Out of medication    [provider]  amLODipine (NORVASC) 5 MG tablet Take 1-2 tablets (5-10 mg total) by mouth as directed. Take 1 tablet (5 mg) daily and may take extra 1 tablet (5 mg) as needed for pressure greater than 140 06/28/20 09/26/20  Minna Merritts, MD  aspirin (ASPIRIN 81) 81 MG EC tablet Take 1 tablet by mouth daily.  01/27/20   [provider]  calcium acetate (PHOSLO) 667 MG capsule Take 2 capsules by mouth in the morning,  at noon, and at bedtime. 12/31/19   [provider]  carvedilol (COREG) 3.125 MG tablet Take 1 tablet (3.125 mg total) by mouth 2 (two) times daily with a meal. 04/30/20   Dhungel, Flonnie Overman, MD  epoetin alfa (EPOGEN) 10000 UNIT/ML injection Inject 0.4 mLs (4,000 Units total) into the vein Every Tuesday,Thursday,and Saturday with dialysis. 04/03/20   Nolberto Hanlon, MD  furosemide (LASIX) 80 MG tablet Take 1 tablet (80 mg total) by mouth daily. 04/02/20 04/02/21  Nolberto Hanlon, MD  hydrALAZINE (APRESOLINE) 50 MG tablet TAKE 1 TABLET BY MOUTH EVERY 8 HOURS Patient taking differently: Take 50 mg by mouth 3 (three) times daily.  06/29/20   Minna Merritts, MD  HYDROcodone-acetaminophen (NORCO) 7.5-325 MG tablet Take 1 tablet by mouth every 6 (six) hours as needed (pain). Patient taking differently: Take 1-2 tablets by mouth daily as needed (pain).  10/08/19   Danford, Suann Larry, MD  ipratropium-albuterol (DUONEB) 0.5-2.5 (3) MG/3ML SOLN Take 3 mLs by nebulization every 6 (six) hours as needed. 10/17/19   [facility], Tami Lin, PA  lidocaine (LIDODERM) 5 % Place 1 patch  onto the skin daily. Remove & Discard patch within 12 hours or as directed by MD 10/14/19   Flora Lipps, MD  lidocaine-prilocaine (EMLA) cream Apply 1 application topically as needed (prior to treatment).     [provider]  losartan (COZAAR) 50 MG tablet Take 50 mg by mouth daily. 03/18/19   [provider]  mirtazapine (REMERON) 15 MG tablet Take 15 mg by mouth at bedtime. 02/25/20   [provider]  oxybutynin (DITROPAN XL) 15 MG 24 hr tablet Take 15 mg by mouth daily. 12/24/17   [provider]  pravastatin (PRAVACHOL) 40 MG tablet Take 40 mg by mouth at bedtime.     [provider]  sertraline (ZOLOFT) 50 MG tablet Take 50 mg by mouth at bedtime. 03/02/20   [provider]    Allergies Enalapril maleate; Nitrofurantoin; Sulfamethoxazole-trimethoprim; 2,4-d dimethylamine (amisol);  Baclofen; Neosporin [neomycin-bacitracin zn-polymyx]; Quinine; Ultram [tramadol]; Zocor [simvastatin]; Bactrim [sulfamethoxazole-trimethoprim]; Levodopa; Macrodantin [nitrofurantoin macrocrystal]; and Quinine derivatives  Family History  Problem Relation Age of Onset   Hypertension Mother    Hyperlipidemia Mother    Heart disease Father    Heart attack Father 47   Hypertension Father    Hyperlipidemia Father    Heart disease Brother        CABG    Heart attack Brother    Breast cancer Neg Hx     Social History Social History   Tobacco Use   Smoking status: Never Smoker   Smokeless tobacco: Never Used  Scientific laboratory technician Use: Never used  Substance Use Topics   Alcohol use: No   Drug use: No    Review of Systems Constitutional: No fever/chills Cardiovascular: Feeling of tightness across the chest Respiratory: Severe shortness of breath getting better Gastrointestinal: No abdominal pain.   Genitourinary: Only urinates a small amount at times   EM caveat    ____________________________________________   PHYSICAL EXAM:  VITAL SIGNS: ED Triage Vitals  Enc Vitals Group     BP 08/28/20 0952 (!) 152/61     Pulse Rate 08/28/20 0952 99     Resp --      Temp 08/28/20 0952 (!) 97.2 F (36.2 C)     Temp Source 08/28/20 0952 Axillary     SpO2 08/28/20 0952 95 %     Weight --      Height --      Head Circumference --      Peak Flow --      Pain Score 08/28/20 0955 0     Pain Loc --      Pain Edu? --      Excl. in [place]? --     Constitutional: Alert and oriented.  Moderate respiratory distress tolerating CPAP well Eyes: Conjunctivae are normal. Head: Atraumatic. Nose: No congestion/rhinnorhea. Mouth/Throat: Mucous membranes are moist. Neck: No stridor.  Cardiovascular: Normal rate, regular rhythm. Grossly normal heart sounds.  Good peripheral circulation. Respiratory: Tachypnea, use of accessory muscles.  Diffuse crackles throughout on exam.  More  crackles lower as compared to upper lobes.  Somewhat rhonchorous.  Moderate respiratory distress.  Is however tolerating CPAP well Gastrointestinal: Soft and nontender. No distention. Musculoskeletal: No lower extremity tenderness  Neurologic:  Normal speech and language is able to speak 1 or 2 words over CPAP. No gross focal neurologic deficits are appreciated.  Skin:  Skin is warm, dry and intact. No rash noted. Psychiatric: Mood and affect are anxious.  ____________________________________________   LABS (all  labs ordered are listed, but only abnormal results are displayed)  Labs Reviewed  COMPREHENSIVE METABOLIC PANEL - Abnormal; Notable for the following components:      Result Value   Sodium 130 (*)    Chloride 90 (*)    Glucose, Bld 182 (*)    BUN 66 (*)    Creatinine, Ser 7.00 (*)    Calcium 8.5 (*)    GFR, Estimated 5 (*)    All other components within normal limits  CBC WITH DIFFERENTIAL/PLATELET - Abnormal; Notable for the following components:   WBC 12.2 (*)    RBC 3.45 (*)    Hemoglobin 10.1 (*)    HCT 30.7 (*)    RDW 15.9 (*)    Neutro Abs 9.3 (*)    Eosinophils Absolute 0.6 (*)    Abs Immature Granulocytes 0.08 (*)    All other components within normal limits  BRAIN NATRIURETIC PEPTIDE - Abnormal; Notable for the following components:   B Natriuretic Peptide 697.1 (*)    All other components within normal limits  RESPIRATORY PANEL BY RT PCR (FLU A&B, COVID)  CULTURE, BLOOD (SINGLE)  LACTIC ACID, PLASMA  PROTIME-INR  APTT  PROCALCITONIN  TROPONIN I (HIGH SENSITIVITY)   ____________________________________________  EKG  ED ECG REPORT I, Delman Kitten, the attending physician, personally viewed and interpreted this ECG.  Date: 08/28/2020 EKG Time: 1158 Rate: 75 Rhythm: normal sinus rhythm QRS Axis: normal Intervals: normal ST/T Wave abnormalities: normal Narrative Interpretation: no evidence of acute ischemia  Twelve-lead appears improved as  compared with the EMS twelve-lead which appeared to have relatively diffuse ST segment depressions  ____________________________________________  RADIOLOGY  DG Chest Port 1 View  Result Date: 08/28/2020 CLINICAL DATA:  Sepsis.  Shortness of breath. EXAM: PORTABLE CHEST 1 VIEW COMPARISON:  08/05/2020 FINDINGS: Patient rotated right. Midline trachea. Mild cardiomegaly. Atherosclerosis in the transverse aorta. No pleural effusion or pneumothorax. Pulmonary interstitial prominence is slightly increased. Subtle right lower lobe opacity is again suspected. IMPRESSION: Interstitial thickening, felt to be slightly increased. Primarily the sequelae of smoking/chronic bronchitis. Developing mild pulmonary venous congestion cannot be excluded. Lateral right lower lobe opacity, which could represent residual or recurrent pneumonia. Aortic Atherosclerosis (ICD10-I70.0). Electronically Signed   By: Abigail Miyamoto M.D.   On: 08/28/2020 10:40     ____________________________________________   PROCEDURES  Procedure(s) performed: None  Procedures  Critical Care performed: Yes, see critical care note(s)  CRITICAL CARE Performed by: Delman Kitten   Total critical care time: 45 minutes  Critical care time was exclusive of separately billable procedures and treating other patients.  Critical care was necessary to treat or prevent imminent or life-threatening deterioration.  Critical care was time spent personally by me on the following activities: development of treatment plan with patient and/or surrogate as well as nursing, discussions with consultants, evaluation of patient's response to treatment, examination of patient, obtaining history from patient or surrogate, ordering and performing treatments and interventions, ordering and review of laboratory studies, ordering and review of radiographic studies, pulse oximetry and re-evaluation of patient's  condition.  ____________________________________________   INITIAL IMPRESSION / ASSESSMENT AND PLAN / ED COURSE  Pertinent labs & imaging results that were available during my care of the patient were reviewed by me and considered in my medical decision making (see chart for details).   Sudden acute dyspnea.  Severely hypertensive responding well to CPAP and nitrates.  Placed on BiPAP which she is tolerating well as of 10 AM.  Her work of  breathing is improving rapidly.  Appears given her clinical history that I suspect this is flash pulmonary edema related to volume overload, I have contacted Dr. Juleen China of nephrology who is arranging for stat dialysis.  Some delay however in water room needs to be prepared and receive a Covid clean.  No ICU or other bed available in the hospital do dialysis  Anticipate continuation of BiPAP and nitrates for blood pressure has improved markedly to 150/61.  Her respiratory rate has improved and she is tolerating BiPAP quite well.  We will continue to manage her clinically awaiting additional testing including lab work and further evaluation to exclude other causes such as infectious, acute cardiac, other pulmonary causes, etc.  Current working diagnosis is flash pulmonary edema but further work-up is required  Patient admitted to Dr. Blaine Hamper.     ----------------------------------------- 12:09 PM on 08/28/2020 -----------------------------------------  Currently awaiting initiation of hemodialysis, will be moving to water room in the ER shortly.  She is tolerating BiPAP well, her work of breathing is good at this time with BiPAP.  I would anticipate that she will likely improve after hemodialysis, and we have discussed the case and plan is to admit to the hospitalist service after receiving dialysis in the ER.  Dr. Juleen China also consulting  Procalcitonin less than 0.25, given her clinical presentation I suspect unlikely to represent acute pulmonary infection.   Rather this most likely indicates pulmonary edema and she is responding well to treatment  Additionally patient reports chronic neck pain, discussed with the patient and will give small dose of morphine for alleviation of this, she does report this to be chronic    ____________________________________________   FINAL CLINICAL IMPRESSION(S) / ED DIAGNOSES  Final diagnoses:  Flash pulmonary edema ([place])  Hypoxia        Note:  This document was prepared using Dragon voice recognition software and may include unintentional dictation errors       Delman Kitten, MD 08/28/20 1211

## 2020-08-28 NOTE — ED Notes (Signed)
Friend of pt, Wallace Keller would like to be contacted at [phone] when pt is done with dialysis and dispo has been made. Pt requests ASA for chronic neck pain, MD aware. Pt resting comfortably at this time. No current distress noted.

## 2020-08-28 NOTE — ED Notes (Signed)
Pr reports slt decrease in pain from 9/10 or 6/10 in neck. Dialysis nurse says dialysis will take about 39 more minutes.

## 2020-08-28 NOTE — H&P (Signed)
History and Physical    Phyllis [name] RSW:[id number] DOB: Aug 25, 1946 DOA: 08/28/2020  Referring MD/NP/PA:   PCP: [name], MD   Patient coming from:  The patient is coming from home.  At baseline, pt is independent for most of ADL.        Chief Complaint: SOB  HPI: Phyllis [name] is a 74 y.o. female with medical history significant of ESRD-HD (TTS), hypertension, hyperlipidemia, diet-controlled diabetes, COPD, asthma, stroke, GERD, gout, depression with anxiety, severe aortic stenosis, OSA, CHF, anemia, who presents with shortness of breath.  Patient states that she is due for dialysis today, and became suddenly shortness of breath. She has dry cough and chest tightness. Denies chest pain, fever or chills. Her shortness breath has been progressively worsening. Per report, her oxygen desaturation to upper 60s, initially CPAP was started by EMS, then BiPAP started in the ED. Patient feels better on BiPAP. Patient states that she has costochondritis, therefore has chronic chest wall pain which has not changed. Denies nausea, vomiting, diarrhea, abdominal pain, symptoms of UTI or unilateral weakness.  ED Course: pt was found to have WBC 12.2, lactic acid 1.8, INR 1.0, PTT twenty-eight, BNP 697, negative Covid PCR, potassium 4.8, bicarbonate twenty-five, creatinine 7.0, BUN sixty-six, temperature 97.2, blood pressure 250/140 --> 152/61, heart rate ninety-nine, oxygen saturation 95% on BiPAP. Patient is placed on progressive bed for observation. Nephrology, Dr. Juleen China is consulted for dialysis.  CXR: I have personally reviewed chest x-ray personally, which showed interstitial thickening and possible vascular congestion. There is mild lateral right lower lobe opacity.  Review of Systems:   General: no fevers, chills, no body weight gain, has fatigue HEENT: no blurry vision, hearing changes or sore throat Respiratory: has dyspnea, coughing, wheezing CV: has chest tightness, no  palpitations GI: no nausea, vomiting, abdominal pain, diarrhea, constipation GU: no dysuria, burning on urination, increased urinary frequency, hematuria  Ext: trace leg edema Neuro: no unilateral weakness, numbness, or tingling, no vision change or hearing loss Skin: no rash, no skin tear. MSK: No muscle spasm, no deformity, no limitation of range of movement in spin Heme: No easy bruising.  Travel history: No recent long distant travel.  Allergy:  Allergies  Allergen Reactions  . Enalapril Maleate Other (See Comments)    Other reaction(s): Headache  . Nitrofurantoin Swelling and Rash    Other Reaction: swelling of body  . Sulfamethoxazole-Trimethoprim Swelling  . 2,4-D Dimethylamine (Amisol) Rash and Other (See Comments)    Other Reaction: h/a  . Baclofen Other (See Comments) and Nausea Only    lightheadness ,drowsiness , muscle weakness , twitching in hands   . Neosporin [Neomycin-Bacitracin Zn-Polymyx] Other (See Comments) and Rash    Other Reaction: irritation Skin irritation   . Quinine Nausea And Vomiting, Rash and Other (See Comments)    Other Reaction: Vomiting, rash, h/a, vision  . Ultram [Tramadol] Palpitations  . Zocor [Simvastatin] Other (See Comments) and Rash    Other Reaction: muscle spasms Muscle pain and spasms  . Bactrim [Sulfamethoxazole-Trimethoprim] Swelling  . Levodopa Other (See Comments)    Reaction: unknown  . Macrodantin [Nitrofurantoin Macrocrystal] Swelling  . Quinine Derivatives Other (See Comments)    Vertigo,nausea vomiting blurred vision headache ears sensitivity     Past Medical History:  Diagnosis Date  . (HFpEF) heart failure with preserved ejection fraction ([place])    a. TTE 01/2014: nl LV sys fxn, no valvular abnormalities; b. TTE 11/16: nl EF, mild LVH;  c. 04/2019 Echo: EF 60-65%.  DD. Nl RV fxn. Mod AS. 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  . Allergy   . Anemia of chronic disease   . Anxiety   . Aortic atherosclerosis  ([place])   . Asthma   . Chronic back pain   . COPD (chronic obstructive pulmonary disease) ([place])   . Diabetes mellitus with complication ([place])   . ESRD on hemodialysis ([place])    a. Tues/Sat; b. 2/2 small kidneys  . Essential hypertension   . Fistula    lower left arm  . GERD (gastroesophageal reflux disease)   . Gout   . History of exercise stress test    a. 01/2014: no evidence of ischemia; b. Lexiscan 08/2015: no sig ischemia, severe GI uptake artifact, low risk; c. CPET @ [facility] 09/2016: exercised 3 min 12 sec on bike without incline, 2.28 METs, VO2 of 8.1, 48% of predicted, indicating mod to sev functional impairment, evidence of blunted HR, stroke volume, and BP augmentation as well as ventilation-perfusion mismatch with exercise  . HLD (hyperlipidemia)   . Non-obstructive Carotid arterial disease ([place])    a. 12/2017: <50% bilat ICA dzs.  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 Severe aortic stenosis    a. by 09/2019 echo b. 04/2019 Echo: Mod AS.  Marland Kitchen Sleep apnea     Past Surgical History:  Procedure Laterality Date  . CARDIAC CATHETERIZATION    . carpel tunnel    . GALLBLADDER SURGERY    . PERIPHERAL VASCULAR CATHETERIZATION N/A 04/12/2015   Procedure: A/V Shuntogram/Fistulagram;  Surgeon: Algernon Huxley, MD;  Location: [facility];  Service: Cardiovascular;  Laterality: N/A;  . PERIPHERAL VASCULAR CATHETERIZATION N/A 04/12/2015   Procedure: A/V Shunt Intervention;  Surgeon: Algernon Huxley, MD;  Location: [facility];  Service: Cardiovascular;  Laterality: N/A;  . PERIPHERAL VASCULAR CATHETERIZATION N/A 06/09/2015   Procedure: Dialysis/Perma Catheter Removal;  Surgeon: Katha Cabal, MD;  Location: [facility];  Service: Cardiovascular;  Laterality: N/A;  . RIGHT/LEFT HEART CATH AND CORONARY ANGIOGRAPHY N/A 10/14/2019   Procedure: RIGHT/LEFT HEART CATH AND CORONARY ANGIOGRAPHY;  Surgeon: Belva Crome, MD;  Location: [facility];   Service: Cardiovascular;  Laterality: N/A;    Social History:  reports that she has never smoked. She has never used smokeless tobacco. She reports that she does not drink alcohol and does not use drugs.  Family History:  Family History  Problem Relation Age of Onset  . Hypertension Mother   . Hyperlipidemia Mother   . Heart disease Father   . Heart attack Father 30  . Hypertension Father   . Hyperlipidemia Father   . Heart disease Brother        CABG   . Heart attack Brother   . Breast cancer Neg Hx      Prior to Admission medications   Medication Sig Start Date End Date Taking? Authorizing Provider  acetaminophen (TYLENOL) 500 MG tablet Take 1 tablet (500 mg total) by mouth every 6 (six) hours as needed for mild pain or fever. 09/29/19   Danford, Suann Larry, MD  albuterol (PROVENTIL HFA;VENTOLIN HFA) 108 (90 Base) MCG/ACT inhaler Inhale 2 puffs into the lungs every 6 (six) hours as needed for wheezing or shortness of breath. Out of medication    [provider]  amLODipine (NORVASC) 5 MG tablet Take 1-2 tablets (5-10 mg total) by mouth as directed. Take 1 tablet (5 mg)  daily and may take extra 1 tablet (5 mg) as needed for pressure greater than 140 06/28/20 09/26/20  Minna Merritts, MD  aspirin (ASPIRIN 81) 81 MG EC tablet Take 1 tablet by mouth daily.  01/27/20   [provider]  benzonatate (TESSALON) 100 MG capsule Take 100 mg by mouth 3 (three) times daily. 04/14/20   [provider]  budesonide-formoterol (SYMBICORT) 160-4.5 MCG/ACT inhaler Inhale 2 puffs into the lungs in the morning and at bedtime. 04/30/20   Dhungel, Flonnie Overman, MD  calcium acetate (PHOSLO) 667 MG capsule Take 2 capsules by mouth in the morning, at noon, and at bedtime. 12/31/19   [provider]  carvedilol (COREG) 3.125 MG tablet Take 1 tablet (3.125 mg total) by mouth 2 (two) times daily with a meal. 04/30/20   Dhungel, Nishant, MD  cetirizine (ZYRTEC) 10 MG tablet Take 10 mg by  mouth daily as needed for allergies.     [provider]  clopidogrel (PLAVIX) 75 MG tablet Take 1 tablet (75 mg total) by mouth daily. 01/18/18   Epifanio Lesches, MD  [facility] MELATONIN 5 MG CHEW Chew 1 tablet by mouth at bedtime. 02/02/20   [provider]  epoetin alfa (EPOGEN) 10000 UNIT/ML injection Inject 0.4 mLs (4,000 Units total) into the vein Every Tuesday,Thursday,and Saturday with dialysis. 04/03/20   Nolberto Hanlon, MD  furosemide (LASIX) 80 MG tablet Take 1 tablet (80 mg total) by mouth daily. 04/02/20 04/02/21  Nolberto Hanlon, MD  hydrALAZINE (APRESOLINE) 50 MG tablet TAKE 1 TABLET BY MOUTH EVERY 8 HOURS 06/29/20   Gollan, Kathlene November, MD  HYDROcodone-acetaminophen (NORCO) 7.5-325 MG tablet Take 1 tablet by mouth every 6 (six) hours as needed (pain). Patient taking differently: Take 1-2 tablets by mouth daily as needed (pain).  10/08/19   Danford, Suann Larry, MD  ipratropium-albuterol (DUONEB) 0.5-2.5 (3) MG/3ML SOLN Take 3 mLs by nebulization every 6 (six) hours as needed. 10/17/19   [facility], Tami Lin, PA  iron polysaccharides (NIFEREX) 150 MG capsule Take 150 mg by mouth daily. 01/09/20 01/08/21  [provider]  lidocaine (LIDODERM) 5 % Place 1 patch onto the skin daily. Remove & Discard patch within 12 hours or as directed by MD 10/14/19   Flora Lipps, MD  lidocaine-prilocaine (EMLA) cream Apply 1 application topically as needed (prior to treatment).     [provider]  losartan (COZAAR) 50 MG tablet Take 50 mg by mouth daily. 03/18/19   [provider]  mirtazapine (REMERON) 15 MG tablet Take 15 mg by mouth at bedtime. 02/25/20   [provider]  mometasone (NASONEX) 50 MCG/ACT nasal spray Place 2 sprays into the nose daily as needed (allergies).  01/10/20   [provider]  oxybutynin (DITROPAN XL) 15 MG 24 hr tablet Take 15 mg by mouth daily. 12/24/17   [provider]  pravastatin (PRAVACHOL) 40 MG tablet Take 40 mg by  mouth at bedtime.     [provider]  sertraline (ZOLOFT) 50 MG tablet Take 50 mg by mouth at bedtime. 03/02/20   [provider]  topiramate (TOPAMAX) 25 MG tablet Take 1 tablet (25 mg total) by mouth as needed (for headaches). 09/07/19   Henreitta Leber, MD    Physical Exam: Vitals:   08/28/20 1030 08/28/20 1100 08/28/20 1130 08/28/20 1200  BP: (!) 155/53 (!) 152/54 (!) 157/57 (!) 164/57  Pulse: 78 69 72 70  Resp: (!) 27 20 (!) 23 (!) 21  Temp:  TempSrc:      SpO2: 100% 100% 100% 100%   General: Not in acute distress HEENT:       Eyes: PERRL, EOMI, no scleral icterus.       ENT: No discharge from the ears and nose, no pharynx injection, no tonsillar enlargement.        Neck: No JVD, no bruit, no mass felt. Heme: No neck lymph node enlargement. Cardiac: S1/S2, RRR, No murmurs, No gallops or rubs. Respiratory: Has wheezing and fine crackles bilaterally GI: Soft, nondistended, nontender, no rebound pain, no organomegaly, BS present. GU: No hematuria Ext: Has trace leg edema bilaterally. 1+DP/PT pulse bilaterally. Musculoskeletal: No joint deformities, No joint redness or warmth, no limitation of ROM in spin. Skin: No rashes.  Neuro: Alert, oriented X3, cranial nerves II-XII grossly intact, moves all extremities normally. Psych: Patient is not psychotic, no suicidal or hemocidal ideation.  Labs on Admission: I have personally reviewed following labs and imaging studies  CBC: Recent Labs  Lab 08/28/20 0958  WBC 12.2*  NEUTROABS 9.3*  HGB 10.1*  HCT 30.7*  MCV 89.0  PLT 253   Basic Metabolic Panel: Recent Labs  Lab 08/28/20 0958  NA 130*  K 4.8  CL 90*  CO2 25  GLUCOSE 182*  BUN 66*  CREATININE 7.00*  CALCIUM 8.5*   GFR: CrCl cannot be calculated (Unknown ideal weight.). Liver Function Tests: Recent Labs  Lab 08/28/20 0958  AST 17  ALT 12  ALKPHOS 118  BILITOT 0.8  PROT 7.0  ALBUMIN 4.1   No results for input(s): LIPASE, AMYLASE  in the last 168 hours. No results for input(s): AMMONIA in the last 168 hours. Coagulation Profile: Recent Labs  Lab 08/28/20 0958  INR 1.0   Cardiac Enzymes: No results for input(s): CKTOTAL, CKMB, CKMBINDEX, TROPONINI in the last 168 hours. BNP (last 3 results) No results for input(s): PROBNP in the last 8760 hours. HbA1C: No results for input(s): HGBA1C in the last 72 hours. CBG: No results for input(s): GLUCAP in the last 168 hours. Lipid Profile: No results for input(s): CHOL, HDL, LDLCALC, TRIG, CHOLHDL, LDLDIRECT in the last 72 hours. Thyroid Function Tests: No results for input(s): TSH, T4TOTAL, FREET4, T3FREE, THYROIDAB in the last 72 hours. Anemia Panel: No results for input(s): VITAMINB12, FOLATE, FERRITIN, TIBC, IRON, RETICCTPCT in the last 72 hours. Urine analysis:    Component Value Date/Time   COLORURINE YELLOW (A) 12/04/2019 2054   APPEARANCEUR CLOUDY (A) 12/04/2019 2054   LABSPEC 1.008 12/04/2019 2054   PHURINE 8.0 12/04/2019 2054   GLUCOSEU NEGATIVE 12/04/2019 2054   HGBUR MODERATE (A) 12/04/2019 2054   BILIRUBINUR NEGATIVE 12/04/2019 2054   [place] 12/04/2019 2054   PROTEINUR 100 (A) 12/04/2019 2054   NITRITE NEGATIVE 12/04/2019 2054   LEUKOCYTESUR LARGE (A) 12/04/2019 2054   Sepsis Labs: @LABRCNTIP (procalcitonin:4,lacticidven:4) ) Recent Results (from the past 240 hour(s))  Respiratory Panel by RT PCR (Flu A&B, Covid) - Nasopharyngeal Swab     Status: None   Collection Time: 08/28/20  9:59 AM   Specimen: Nasopharyngeal Swab  Result Value Ref Range Status   SARS Coronavirus 2 by RT PCR NEGATIVE NEGATIVE Final    Comment: (NOTE) SARS-CoV-2 target nucleic acids are NOT DETECTED.  The SARS-CoV-2 RNA is generally detectable in upper respiratoy specimens during the acute phase of infection. The lowest concentration of SARS-CoV-2 viral copies this assay can detect is 131 copies/mL. A negative result does not preclude SARS-Cov-2 infection  and should not be used  as the sole basis for treatment or other patient management decisions. A negative result may occur with  improper specimen collection/handling, submission of specimen other than nasopharyngeal swab, presence of viral mutation(s) within the areas targeted by this assay, and inadequate number of viral copies (<131 copies/mL). A negative result must be combined with clinical observations, patient history, and epidemiological information. The expected result is Negative.  Fact Sheet for Patients:  PinkCheek.be  Fact Sheet for Healthcare Providers:  GravelBags.it  This test is no t yet approved or cleared by the [place] FDA and  has been authorized for detection and/or diagnosis of SARS-CoV-2 by FDA under an Emergency Use Authorization (EUA). This EUA will remain  in effect (meaning this test can be used) for the duration of the COVID-19 declaration under Section 564(b)(1) of the Act, 21 U.S.C. section 360bbb-3(b)(1), unless the authorization is terminated or revoked sooner.     Influenza A by PCR NEGATIVE NEGATIVE Final   Influenza B by PCR NEGATIVE NEGATIVE Final    Comment: (NOTE) The Xpert Xpress SARS-CoV-2/FLU/RSV assay is intended as an aid in  the diagnosis of influenza from Nasopharyngeal swab specimens and  should not be used as a sole basis for treatment. Nasal washings and  aspirates are unacceptable for Xpert Xpress SARS-CoV-2/FLU/RSV  testing.  Fact Sheet for Patients: PinkCheek.be  Fact Sheet for Healthcare Providers: GravelBags.it  This test is not yet approved or cleared by the [place] FDA and  has been authorized for detection and/or diagnosis of SARS-CoV-2 by  FDA under an Emergency Use Authorization (EUA). This EUA will remain  in effect (meaning this test can be used) for the duration of the  Covid-19 declaration  under Section 564(b)(1) of the Act, 21  U.S.C. section 360bbb-3(b)(1), unless the authorization is  terminated or revoked. Performed at [facility], [address], [place], [place] [address]      Radiological Exams on Admission: DG Chest [facility] 1 View  Result Date: 08/28/2020 CLINICAL DATA:  Sepsis.  Shortness of breath. EXAM: PORTABLE CHEST 1 VIEW COMPARISON:  08/05/2020 FINDINGS: Patient rotated right. Midline trachea. Mild cardiomegaly. Atherosclerosis in the transverse aorta. No pleural effusion or pneumothorax. Pulmonary interstitial prominence is slightly increased. Subtle right lower lobe opacity is again suspected. IMPRESSION: Interstitial thickening, felt to be slightly increased. Primarily the sequelae of smoking/chronic bronchitis. Developing mild pulmonary venous congestion cannot be excluded. Lateral right lower lobe opacity, which could represent residual or recurrent pneumonia. Aortic Atherosclerosis (ICD10-I70.0). Electronically Signed   By: Abigail Miyamoto M.D.   On: 08/28/2020 10:40     EKG:  Not done in ED, will get one.   Assessment/Plan Principal Problem:   Acute on chronic respiratory failure with hypoxia (HCC) Active Problems:   ESRD (end stage renal disease) on dialysis (HCC)   Hyperlipidemia   CVA (cerebral vascular accident) ([place])   Chronic diastolic heart failure (HCC)   HTN (hypertension)   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-stage renal disease) (HCC)   Flash pulmonary edema (HCC)   COPD exacerbation (HCC)   Leukocytosis   Depression with anxiety   Type II diabetes mellitus with renal manifestations (HCC)   Acute on chronic respiratory failure with hypoxia ([place]): Likely due to combination of flash pulmonary edema and COPD exacerbation.  -Placed on progressive benefit observation -Continue BiPAP -Bronchodilators -Renal, Dr. Juleen China is consulted for dialysis  COPD exacerbation: Patient has a wheezing on auscultation, indicating COPD  exacerbation -Bronchodilators -Solu-Medrol 40 mg twice daily -Z-Pak -Follow-up of blood culture  and sputum culture  ESRD (end stage renal disease) on dialysis ([place]) -renal is consutled for HD, Dr. Juleen China  Hyperlipidemia -Pravastatin  CVA (cerebral vascular accident) ([place]) -Aspirin, pravastatin  Chronic diastolic heart failure ([place]): 2D echo on 04/20/2020 showed EF 60-65% with grade 1 diastolic dysfunction.  Patient has flash pulmonary edema today -Volume management per renal by dialysis  HTN (hypertension) -IV hydralazine as needed -Continue home amlodipine, Coreg, hydralazine, and Lasix  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-stage renal disease) ([place]): Hemoglobin stable, 10.1 -Follow-up with CBC  Depression with anxiety -Continue home medications  Diet controlled Type II diabetes mellitus with renal manifestations [facility]): Recent A1c 5.1, well controlled.  Patient is not taking medications at home. -Check blood sugar, qAM        DVT ppx: SQ Heparin  Code Status: Full code Family Communication: not done, no family member is at bed side.   Disposition Plan:  Anticipate discharge back to previous environment Consults called: Dr. Juleen China of nephrology Admission status: progressive unit for obs    Status is: Observation  The patient remains OBS appropriate and will d/c before 2 midnights.  Dispo: The patient is from: Home              Anticipated d/c is to: Home              Anticipated d/c date is: 1 day              Patient currently is not medically stable to d/c.          Date of Service 08/28/2020    Ivor Costa [facility]   If 7PM-7AM, please contact night-coverage [email or web address] 08/28/2020, 12:56 PM

## 2020-08-28 NOTE — ED Triage Notes (Signed)
Pt presents to ED via EMS with c/o of SOB, pt presents on cpap placed by EMS, O2. Per EMS s/s started today.

## 2020-08-29 DIAGNOSIS — I25118 Atherosclerotic heart disease of native coronary artery with other forms of angina pectoris: Secondary | ICD-10-CM | POA: Diagnosis present

## 2020-08-29 DIAGNOSIS — I5032 Chronic diastolic (congestive) heart failure: Secondary | ICD-10-CM | POA: Diagnosis present

## 2020-08-29 DIAGNOSIS — G8929 Other chronic pain: Secondary | ICD-10-CM | POA: Diagnosis present

## 2020-08-29 DIAGNOSIS — I214 Non-ST elevation (NSTEMI) myocardial infarction: Secondary | ICD-10-CM | POA: Diagnosis present

## 2020-08-29 DIAGNOSIS — M109 Gout, unspecified: Secondary | ICD-10-CM | POA: Diagnosis present

## 2020-08-29 DIAGNOSIS — Z79899 Other long term (current) drug therapy: Secondary | ICD-10-CM | POA: Diagnosis not present

## 2020-08-29 DIAGNOSIS — M940 Chondrocostal junction syndrome [Tietze]: Secondary | ICD-10-CM | POA: Diagnosis present

## 2020-08-29 DIAGNOSIS — N186 End stage renal disease: Secondary | ICD-10-CM | POA: Diagnosis present

## 2020-08-29 DIAGNOSIS — I132 Hypertensive heart and chronic kidney disease with heart failure and with stage 5 chronic kidney disease, or end stage renal disease: Secondary | ICD-10-CM | POA: Diagnosis present

## 2020-08-29 DIAGNOSIS — E785 Hyperlipidemia, unspecified: Secondary | ICD-10-CM | POA: Diagnosis present

## 2020-08-29 DIAGNOSIS — J9621 Acute and chronic respiratory failure with hypoxia: Secondary | ICD-10-CM | POA: Diagnosis present

## 2020-08-29 DIAGNOSIS — Z515 Encounter for palliative care: Secondary | ICD-10-CM | POA: Diagnosis not present

## 2020-08-29 DIAGNOSIS — F418 Other specified anxiety disorders: Secondary | ICD-10-CM | POA: Diagnosis present

## 2020-08-29 DIAGNOSIS — I350 Nonrheumatic aortic (valve) stenosis: Secondary | ICD-10-CM | POA: Diagnosis present

## 2020-08-29 DIAGNOSIS — K219 Gastro-esophageal reflux disease without esophagitis: Secondary | ICD-10-CM | POA: Diagnosis present

## 2020-08-29 DIAGNOSIS — J9601 Acute respiratory failure with hypoxia: Secondary | ICD-10-CM | POA: Diagnosis present

## 2020-08-29 DIAGNOSIS — D631 Anemia in chronic kidney disease: Secondary | ICD-10-CM | POA: Diagnosis present

## 2020-08-29 DIAGNOSIS — J810 Acute pulmonary edema: Secondary | ICD-10-CM | POA: Diagnosis present

## 2020-08-29 DIAGNOSIS — I248 Other forms of acute ischemic heart disease: Secondary | ICD-10-CM | POA: Diagnosis not present

## 2020-08-29 DIAGNOSIS — I700 Atherosclerosis of aorta: Secondary | ICD-10-CM | POA: Diagnosis present

## 2020-08-29 DIAGNOSIS — J441 Chronic obstructive pulmonary disease with (acute) exacerbation: Secondary | ICD-10-CM | POA: Diagnosis present

## 2020-08-29 DIAGNOSIS — N2581 Secondary hyperparathyroidism of renal origin: Secondary | ICD-10-CM | POA: Diagnosis present

## 2020-08-29 DIAGNOSIS — I2510 Atherosclerotic heart disease of native coronary artery without angina pectoris: Secondary | ICD-10-CM | POA: Diagnosis not present

## 2020-08-29 DIAGNOSIS — E1122 Type 2 diabetes mellitus with diabetic chronic kidney disease: Secondary | ICD-10-CM | POA: Diagnosis present

## 2020-08-29 DIAGNOSIS — Z992 Dependence on renal dialysis: Secondary | ICD-10-CM | POA: Diagnosis not present

## 2020-08-29 DIAGNOSIS — Z8673 Personal history of transient ischemic attack (TIA), and cerebral infarction without residual deficits: Secondary | ICD-10-CM | POA: Diagnosis not present

## 2020-08-29 DIAGNOSIS — Z20822 Contact with and (suspected) exposure to covid-19: Secondary | ICD-10-CM | POA: Diagnosis present

## 2020-08-29 LAB — CBC
HCT: 25.1 % — ABNORMAL LOW (ref 36.0–46.0)
Hemoglobin: 8.3 g/dL — ABNORMAL LOW (ref 12.0–15.0)
MCH: 29.2 pg (ref 26.0–34.0)
MCHC: 33.1 g/dL (ref 30.0–36.0)
MCV: 88.4 fL (ref 80.0–100.0)
Platelets: 140 10*3/uL — ABNORMAL LOW (ref 150–400)
RBC: 2.84 MIL/uL — ABNORMAL LOW (ref 3.87–5.11)
RDW: 15.9 % — ABNORMAL HIGH (ref 11.5–15.5)
WBC: 7.2 10*3/uL (ref 4.0–10.5)
nRBC: 0 % (ref 0.0–0.2)

## 2020-08-29 LAB — BASIC METABOLIC PANEL
Anion gap: 16 — ABNORMAL HIGH (ref 5–15)
BUN: 36 mg/dL — ABNORMAL HIGH (ref 8–23)
CO2: 27 mmol/L (ref 22–32)
Calcium: 8.6 mg/dL — ABNORMAL LOW (ref 8.9–10.3)
Chloride: 93 mmol/L — ABNORMAL LOW (ref 98–111)
Creatinine, Ser: 4.86 mg/dL — ABNORMAL HIGH (ref 0.44–1.00)
GFR, Estimated: 8 mL/min — ABNORMAL LOW (ref >60)
Glucose, Bld: 158 mg/dL — ABNORMAL HIGH (ref 70–99)
Potassium: 5.2 mmol/L — ABNORMAL HIGH (ref 3.5–5.1)
Sodium: 136 mmol/L (ref 135–145)

## 2020-08-29 LAB — TROPONIN I (HIGH SENSITIVITY)
Troponin I (High Sensitivity): 266 ng/L (ref <18)
Troponin I (High Sensitivity): 461 ng/L (ref <18)

## 2020-08-29 LAB — BRAIN NATRIURETIC PEPTIDE: B Natriuretic Peptide: 948 pg/mL — ABNORMAL HIGH (ref 0.0–100.0)

## 2020-08-29 MED ORDER — PREDNISONE 20 MG PO TABS
40.0000 mg | ORAL_TABLET | Freq: Every day | ORAL | Status: DC
  Administered 2020-08-30 – 2020-09-03 (×5): 40 mg via ORAL
  Filled 2020-08-29 (×3): qty 2
  Filled 2020-08-29 (×2): qty 4

## 2020-08-29 MED ORDER — HYDROXYZINE HCL 10 MG PO TABS
10.0000 mg | ORAL_TABLET | Freq: Four times a day (QID) | ORAL | Status: DC | PRN
  Administered 2020-08-29: 10 mg via ORAL
  Filled 2020-08-29 (×3): qty 1

## 2020-08-29 MED ORDER — HEPARIN BOLUS VIA INFUSION
3200.0000 [IU] | Freq: Once | INTRAVENOUS | Status: AC
  Administered 2020-08-29: 3200 [IU] via INTRAVENOUS
  Filled 2020-08-29: qty 3200

## 2020-08-29 MED ORDER — IPRATROPIUM-ALBUTEROL 0.5-2.5 (3) MG/3ML IN SOLN
3.0000 mL | RESPIRATORY_TRACT | Status: DC
  Administered 2020-08-29 – 2020-09-01 (×13): 3 mL via RESPIRATORY_TRACT
  Filled 2020-08-29 (×13): qty 3

## 2020-08-29 MED ORDER — HEPARIN (PORCINE) 25000 UT/250ML-% IV SOLN
550.0000 [IU]/h | INTRAVENOUS | Status: DC
  Administered 2020-08-29: 650 [IU]/h via INTRAVENOUS
  Administered 2020-08-31: 550 [IU]/h via INTRAVENOUS
  Filled 2020-08-29 (×2): qty 250

## 2020-08-29 MED ORDER — NITROGLYCERIN 0.4 MG SL SUBL
0.4000 mg | SUBLINGUAL_TABLET | SUBLINGUAL | Status: AC | PRN
  Administered 2020-08-29 (×3): 0.4 mg via SUBLINGUAL
  Filled 2020-08-29 (×4): qty 1

## 2020-08-29 NOTE — Progress Notes (Signed)
[facility]  ROUNDING NOTE   Subjective:   Home health aide at bedside.   Patient underwent emergent hemodialysis treatment yesterday. UF of 2 liters. Patient asked to come off early and did not get to her goal of 2.5-3.5 liters and only completed three hours of her prescribed treatment.   Patient this morning feels tired and short of breath with wheezing.   K 5.2     Objective:  Vital signs in last 24 hours:  Temp:  [97.9 F (36.6 C)-99.3 F (37.4 C)] 98.1 F (36.7 C) (10/10 0848) Pulse Rate:  [65-97] 97 (10/10 0848) Resp:  [15-27] 18 (10/10 0528) BP: (106-164)/(46-62) 149/62 (10/10 0848) SpO2:  [98 %-100 %] 100 % (10/10 0848) Weight:  [61.6 kg-61.7 kg] 61.7 kg (10/10 0528)  Weight change:  Filed Weights   08/28/20 1813 08/29/20 0528  Weight: 61.6 kg 61.7 kg    Intake/Output: I/O last 3 completed shifts: In: -  Out: 1654 [[id number]]   Intake/Output this shift:  No intake/output data recorded.  Physical Exam: General: Laying in bed  Head: Normocephalic, atraumatic. Moist oral mucosal membranes  Eyes: Anicteric, PERRL  Neck: Supple, trachea midline  Lungs:  Crackles at bases bilaterally , 4L [place] O2   Heart: Regular rate and rhythm, +murmur  Abdomen:  Soft, nontender,   Extremities: no peripheral edema.  Neurologic: Nonfocal, moving all four extremities  Skin: No lesions  Access: Left AVF    Basic Metabolic Panel: Recent Labs  Lab 08/28/20 0958 08/29/20 0440  NA 130* 136  K 4.8 5.2*  CL 90* 93*  CO2 25 27  GLUCOSE 182* 158*  BUN 66* 36*  CREATININE 7.00* 4.86*  CALCIUM 8.5* 8.6*    Liver Function Tests: Recent Labs  Lab 08/28/20 0958  AST 17  ALT 12  ALKPHOS 118  BILITOT 0.8  PROT 7.0  ALBUMIN 4.1   No results for input(s): LIPASE, AMYLASE in the last 168 hours. No results for input(s): AMMONIA in the last 168 hours.  CBC: Recent Labs  Lab 08/28/20 0958 08/29/20 0440  WBC 12.2* 7.2  NEUTROABS 9.3*  --   HGB 10.1*  8.3*  HCT 30.7* 25.1*  MCV 89.0 88.4  PLT 208 140*    Cardiac Enzymes: No results for input(s): CKTOTAL, CKMB, CKMBINDEX, TROPONINI in the last 168 hours.  BNP: Invalid input(s): POCBNP  CBG: Recent Labs  Lab 08/28/20 1953  GLUCAP 205*    Microbiology: Results for orders placed or performed during the hospital encounter of 08/28/20  Blood culture (routine single)     Status: None (Preliminary result)   Collection Time: 08/28/20  9:59 AM   Specimen: BLOOD  Result Value Ref Range Status   Specimen Description BLOOD RAC  Final   Special Requests   Final    BOTTLES DRAWN AEROBIC AND ANAEROBIC Blood Culture adequate volume   Culture   Final    NO GROWTH < 24 HOURS Performed at [facility], [address], [place],  [address]    Report Status PENDING  Incomplete  Respiratory Panel by RT PCR (Flu A&B, Covid) - Nasopharyngeal Swab     Status: None   Collection Time: 08/28/20  9:59 AM   Specimen: Nasopharyngeal Swab  Result Value Ref Range Status   SARS Coronavirus 2 by RT PCR NEGATIVE NEGATIVE Final    Comment: (NOTE) SARS-CoV-2 target nucleic acids are NOT DETECTED.  The SARS-CoV-2 RNA is generally detectable in upper respiratoy specimens during the acute phase of  infection. The lowest concentration of SARS-CoV-2 viral copies this assay can detect is 131 copies/mL. A negative result does not preclude SARS-Cov-2 infection and should not be used as the sole basis for treatment or other patient management decisions. A negative result may occur with  improper specimen collection/handling, submission of specimen other than nasopharyngeal swab, presence of viral mutation(s) within the areas targeted by this assay, and inadequate number of viral copies (<131 copies/mL). A negative result must be combined with clinical observations, patient history, and epidemiological information. The expected result is Negative.  Fact Sheet for Patients:   PinkCheek.be  Fact Sheet for Healthcare Providers:  GravelBags.it  This test is no t yet approved or cleared by the [place] FDA and  has been authorized for detection and/or diagnosis of SARS-CoV-2 by FDA under an Emergency Use Authorization (EUA). This EUA will remain  in effect (meaning this test can be used) for the duration of the COVID-19 declaration under Section 564(b)(1) of the Act, 21 U.S.C. section 360bbb-3(b)(1), unless the authorization is terminated or revoked sooner.     Influenza A by PCR NEGATIVE NEGATIVE Final   Influenza B by PCR NEGATIVE NEGATIVE Final    Comment: (NOTE) The Xpert Xpress SARS-CoV-2/FLU/RSV assay is intended as an aid in  the diagnosis of influenza from Nasopharyngeal swab specimens and  should not be used as a sole basis for treatment. Nasal washings and  aspirates are unacceptable for Xpert Xpress SARS-CoV-2/FLU/RSV  testing.  Fact Sheet for Patients: PinkCheek.be  Fact Sheet for Healthcare Providers: GravelBags.it  This test is not yet approved or cleared by the [place] FDA and  has been authorized for detection and/or diagnosis of SARS-CoV-2 by  FDA under an Emergency Use Authorization (EUA). This EUA will remain  in effect (meaning this test can be used) for the duration of the  Covid-19 declaration under Section 564(b)(1) of the Act, 21  U.S.C. section 360bbb-3(b)(1), unless the authorization is  terminated or revoked. Performed at [facility], [address] [place], [place] [address]     Coagulation Studies: Recent Labs    08/28/20 0958  LABPROT 13.0  INR 1.0    Urinalysis: No results for input(s): COLORURINE, LABSPEC, PHURINE, GLUCOSEU, HGBUR, BILIRUBINUR, KETONESUR, PROTEINUR, UROBILINOGEN, NITRITE, LEUKOCYTESUR in the last 72 hours.  Invalid input(s): APPERANCEUR    Imaging: DG  Chest Port 1 View  Result Date: 08/28/2020 CLINICAL DATA:  Sepsis.  Shortness of breath. EXAM: PORTABLE CHEST 1 VIEW COMPARISON:  08/05/2020 FINDINGS: Patient rotated right. Midline trachea. Mild cardiomegaly. Atherosclerosis in the transverse aorta. No pleural effusion or pneumothorax. Pulmonary interstitial prominence is slightly increased. Subtle right lower lobe opacity is again suspected. IMPRESSION: Interstitial thickening, felt to be slightly increased. Primarily the sequelae of smoking/chronic bronchitis. Developing mild pulmonary venous congestion cannot be excluded. Lateral right lower lobe opacity, which could represent residual or recurrent pneumonia. Aortic Atherosclerosis (ICD10-I70.0). Electronically Signed   By: Abigail Miyamoto M.D.   On: 08/28/2020 10:40     Medications:   . sodium chloride     . amLODipine  5 mg Oral Daily  . aspirin EC  81 mg Oral Daily  . azithromycin  250 mg Oral Daily  . calcium acetate  1,334 mg Oral TID WC  . carvedilol  3.125 mg Oral BID WC  . furosemide  80 mg Oral Daily  . heparin  5,000 Units Subcutaneous Q8H  . hydrALAZINE  50 mg Oral TID  . ipratropium-albuterol  3 mL Nebulization Q4H  .  lidocaine  1 patch Transdermal Q24H  . methylPREDNISolone (SOLU-MEDROL) injection  40 mg Intravenous Q12H  . mirtazapine  15 mg Oral QHS  . oxybutynin  15 mg Oral Daily  . pravastatin  40 mg Oral QHS  . sertraline  50 mg Oral QHS  . sodium chloride flush  3 mL Intravenous Q12H   sodium chloride, acetaminophen, albuterol, dextromethorphan-guaiFENesin, hydrALAZINE, HYDROcodone-acetaminophen, lidocaine-prilocaine, ondansetron (ZOFRAN) IV, sodium chloride flush  Assessment/ Plan:  Ms. MALEKA CONTINO is a 74 y.o. white female with end stage renal disease on hemodialysis, hypertension, aortic stenosis, COPD, diabetes mellitus type II, chronic costochrondritis who was admitted to [facility] on 08/28/2020 for Flash pulmonary edema ([place]) [J81.0] Hypoxia [R09.02] Acute  on chronic respiratory failure with hypoxia ([place]) [J96.21]  [facility] TTS [place] Left AVF 60kg  1. End Stage Renal Disease with hyperkalemia: emergent hemodialysis treatment on admission.  Seems patient has not been getting to her dry weight as an outpatient. Leaving at [place]. Possibly, patient has gained weight. She does not appear to have much volume overload on examination.  No indication for dialysis today Discontinue losartan due to hyperkalemia  2. Hypertension: 149/62. Holding home regimen of carvedilol, furosemide, amlodipine, losartan - Holding losartan  3. Anemia with chronic kidney disease: hemoglobin 8.3 - EPO with HD treatments.   4. Secondary Hyperparathyroidism:  - calcium acetate with meals.    LOS: 0 Aayla Marrocco 10/10/202110:29 AM

## 2020-08-29 NOTE — Progress Notes (Signed)
ANTICOAGULATION CONSULT NOTE - Initial Consult  Pharmacy Consult for Heparin Drip Indication: chest pain/ACS  Allergies  Allergen Reactions  . Enalapril Maleate Other (See Comments)    Other reaction(s): Headache  . Nitrofurantoin Swelling and Rash    Other Reaction: swelling of body  . Sulfamethoxazole-Trimethoprim Swelling  . 2,4-D Dimethylamine (Amisol) Rash and Other (See Comments)    Other Reaction: h/a  . Baclofen Other (See Comments) and Nausea Only    lightheadness ,drowsiness , muscle weakness , twitching in hands   . Neosporin [Neomycin-Bacitracin Zn-Polymyx] Other (See Comments) and Rash    Other Reaction: irritation Skin irritation   . Quinine Nausea And Vomiting, Rash and Other (See Comments)    Other Reaction: Vomiting, rash, h/a, vision  . Ultram [Tramadol] Palpitations  . Zocor [Simvastatin] Other (See Comments) and Rash    Other Reaction: muscle spasms Muscle pain and spasms  . Bactrim [Sulfamethoxazole-Trimethoprim] Swelling  . Levodopa Other (See Comments)    Reaction: unknown  . Macrodantin [Nitrofurantoin Macrocrystal] Swelling  . Quinine Derivatives Other (See Comments)    Vertigo,nausea vomiting blurred vision headache ears sensitivity     Patient Measurements: Height: 4\' 10"  (147.3 cm) Weight: 61.7 kg (136 lb 1.4 oz) IBW/kg (Calculated) : 40.9 Heparin Dosing Weight: 54.3 kg  Vital Signs: Temp: 98.5 F (36.9 C) (10/10 2002) Temp Source: Oral (10/10 2002) BP: 131/50 (10/10 2002) Pulse Rate: 83 (10/10 2002)  Labs: Recent Labs    08/28/20 0958 08/29/20 0440 08/29/20 1842  HGB 10.1* 8.3*  --   HCT 30.7* 25.1*  --   PLT 208 140*  --   APTT 28  --   --   LABPROT 13.0  --   --   INR 1.0  --   --   CREATININE 7.00* 4.86*  --   TROPONINIHS 32*  --  266*    Estimated Creatinine Clearance: 7.9 mL/min (A) (by C-G formula based on SCr of 4.86 mg/dL (H)).   Medical History: Past Medical History:  Diagnosis Date  . (HFpEF) heart failure  with preserved ejection fraction ([place])    a. TTE 01/2014: nl LV sys fxn, no valvular abnormalities; b. TTE 11/16: nl EF, mild LVH;  c. 04/2019 Echo: EF 60-65%. DD. Nl RV fxn. Mod AS. 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  . Allergy   . Anemia of chronic disease   . Anxiety   . Aortic atherosclerosis ([place])   . Asthma   . Chronic back pain   . COPD (chronic obstructive pulmonary disease) ([place])   . Diabetes mellitus with complication ([place])   . ESRD on hemodialysis ([place])    a. Tues/Sat; b. 2/2 small kidneys  . Essential hypertension   . Fistula    lower left arm  . GERD (gastroesophageal reflux disease)   . Gout   . History of exercise stress test    a. 01/2014: no evidence of ischemia; b. Lexiscan 08/2015: no sig ischemia, severe GI uptake artifact, low risk; c. CPET @ [facility] 09/2016: exercised 3 min 12 sec on bike without incline, 2.28 METs, VO2 of 8.1, 48% of predicted, indicating mod to sev functional impairment, evidence of blunted HR, stroke volume, and BP augmentation as well as ventilation-perfusion mismatch with exercise  . HLD (hyperlipidemia)   . Non-obstructive Carotid arterial disease ([place])    a. 12/2017: <50% bilat ICA dzs.  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 Severe aortic stenosis  a. by 09/2019 echo b. 04/2019 Echo: Mod AS.  Marland Kitchen Sleep apnea    Assessment: Patient is a 74yo female presented with chest pain. Troponin 32 > 266. Pharmacy consulted for Heparin dosing.  Goal of Therapy:  Heparin level 0.3-0.7 units/ml Monitor platelets by anticoagulation protocol: Yes   Plan:  Give 3200 units bolus x 1 Start heparin infusion at 650 units/hr Check anti-Xa level in 8 hours and daily while on heparin Continue to monitor H&H and platelets  Paulina Fusi, PharmD, BCPS 08/29/2020 8:13 PM

## 2020-08-29 NOTE — Progress Notes (Addendum)
PROGRESS NOTE    Phyllis [name]  PPI:[id number] DOB: [birth year] DOA: 08/28/2020 PCP: [name], MD    Assessment & Plan:   Principal Problem:   Acute on chronic respiratory failure with hypoxia ([place]) Active Problems:   ESRD (end stage renal disease) on dialysis (HCC)   Hyperlipidemia   CVA (cerebral vascular accident) ([place])   Chronic diastolic heart failure (HCC)   HTN (hypertension)   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-stage renal disease) ([place])   Flash pulmonary edema (HCC)   COPD exacerbation (HCC)   Depression with anxiety   Type II diabetes mellitus with renal manifestations ([place])   Acute respiratory failure with hypoxia (HCC)    Phyllis [name] is a 74 y.o. female with medical history significant of ESRD-HD (TTS), hypertension, hyperlipidemia, diet-controlled diabetes, COPD, asthma, stroke, GERD, gout, depression with anxiety, severe aortic stenosis, OSA, CHF, anemia, who presented with shortness of breath.   Chest pain Chronic chest pain 2/2 costochondritis --trop 32 on presentation, which was better than prior.  Pt complained of chest pain this afternoon 10/10. PLAN: --continue home Norco PRN --continue home lidocaine patch --EKG and troponin  Addendum: troponin came back 266.  EKG this afternoon looked different than yesterday, with new RBBB and ST abnormalities.   --consult pharm for heparin gtt --consult cardiology, messaged Dr. Rockey Situ. --trend trop  Acute respiratory failure with hypoxia ([place]):  --Per report, her oxygen desaturation to upper 60s, initially CPAP was started by EMS, then BiPAP started in the ED, which was weaned down to [place]. --CXR showed "Pulmonary interstitial prominence is slightly increased."  Was started on COPD exacerbation tx on admission.   --Personal nursing Aid reported these episode of dyspnea and hypoxia tended to happen suddenly, and pt felt better on BiPAP and after inpatient dialysis, suggesting possible pulm edema, however, another  etiology could also be mucus plugging.  On exam, pt had loud rhonchi instead of crackles.   PLAN: --dialysis to remove fluid --continue tx for COPD exacerbation for now --initiate chest PT for mucus clearance therapy.  Flutter valve and incentive spirometry. --wean O2.  Saturation goal 88-92%.  COPD exacerbation:  --Started on Bronchodilators and Solu-Medrol 40 mg BID and azithromycin PLAN: --continue DuoNeb q4h while awake --continue azithromycin --taper steroid to prednisone 40 mg tomorrow  ESRD (end stage renal disease) on dialysis ([place]) --iHD per nephrology  Hyperkalemia -K 5.2 this morning. PLAN: --no need for dialysis today --d/c losartan, per nephrology  Hyperlipidemia --cont home statin  CVA (cerebral vascular accident) ([place]) -Aspirin, pravastatin  Chronic diastolic heart failure ([place]):  2D echo on 04/20/2020 showed EF 60-65% with grade 1 diastolic dysfunction.   -Volume management per renal by dialysis -cont home Lasix 80 mg daily  HTN (hypertension) -IV hydralazine as needed -Continue home amlodipine, Coreg, hydralazine, and Lasix --d/c losartan, per nephrology  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-stage renal disease) ([place]):  --EPO with HD  Chronic pain on chronic opioids --Rx confirmed on PDMP --continue home Norco PRN  Depression with anxiety --Pt reported significant anxiety which causes dyspnea PLAN: --continue home Remeron and Sertraline. --Start Atarax PRN for anxiety --avoid benzo since already on opioids  Diet controlled Type II diabetes mellitus with renal manifestations [facility]): Recent A1c 5.1, well controlled.  Patient is not taking medications at home. --no need for fingersticks.    DVT prophylaxis: Heparin SQ Code Status: Full code  Family Communication: personal aid updated at the bedside today Status is: changed to inpatient Dispo:   The patient is from: home Anticipated  d/c is to: home Anticipated d/c date is: 2-3 days Patient  currently is not medically stable to d/c due to: significant dyspnea and intermittent O2 saturation drops.     Subjective and Interval History:  Pt got off dialysis early last night, per nephrology. Pt has a private aid at bedside who reported pt didn't miss any outpatient dialysis.  Pt kept complaining of dyspnea, however, with good O2 saturation.  Lung sounds not so much wheezy as rhonchi.  Started chest PT.  Pt complained of being anxious and chest discomfort.   Objective: Vitals:   08/29/20 0848 08/29/20 1108 08/29/20 1228 08/29/20 1524  BP: (!) 149/62 (!) 137/51  (!) 120/46  Pulse: 97   78  Resp:    18  Temp: 98.1 F (36.7 C) 98.1 F (36.7 C)  98.6 F (37 C)  TempSrc: Oral Oral  Oral  SpO2: 100%  96% 100%  Weight:      Height:        Intake/Output Summary (Last 24 hours) at 08/29/2020 1824 Last data filed at 08/29/2020 1524 Gross per 24 hour  Intake --  Output 0 ml  Net 0 ml   Filed Weights   08/28/20 1813 08/29/20 0528  Weight: 61.6 kg 61.7 kg    Examination:   Constitutional: NAD, AAOx3 HEENT: conjunctivae and lids normal, EOMI CV: RRR no M,R,G. Distal pulses +2.  No cyanosis.   RESP: diffuse rhonchi GI: +BS, NTND Extremities: No effusions, edema, or tenderness in BLE SKIN: warm, dry and intact Neuro: II - XII grossly intact.  Sensation intact   Data Reviewed: I have personally reviewed following labs and imaging studies  CBC: Recent Labs  Lab 08/28/20 0958 08/29/20 0440  WBC 12.2* 7.2  NEUTROABS 9.3*  --   HGB 10.1* 8.3*  HCT 30.7* 25.1*  MCV 89.0 88.4  PLT 208 751*   Basic Metabolic Panel: Recent Labs  Lab 08/28/20 0958 08/29/20 0440  NA 130* 136  K 4.8 5.2*  CL 90* 93*  CO2 25 27  GLUCOSE 182* 158*  BUN 66* 36*  CREATININE 7.00* 4.86*  CALCIUM 8.5* 8.6*   GFR: Estimated Creatinine Clearance: 7.9 mL/min (A) (by C-G formula based on SCr of 4.86 mg/dL (H)). Liver Function Tests: Recent Labs  Lab 08/28/20 0958  AST 17  ALT 12   ALKPHOS 118  BILITOT 0.8  PROT 7.0  ALBUMIN 4.1   No results for input(s): LIPASE, AMYLASE in the last 168 hours. No results for input(s): AMMONIA in the last 168 hours. Coagulation Profile: Recent Labs  Lab 08/28/20 0958  INR 1.0   Cardiac Enzymes: No results for input(s): CKTOTAL, CKMB, CKMBINDEX, TROPONINI in the last 168 hours. BNP (last 3 results) No results for input(s): PROBNP in the last 8760 hours. HbA1C: No results for input(s): HGBA1C in the last 72 hours. CBG: Recent Labs  Lab 08/28/20 1953  GLUCAP 205*   Lipid Profile: No results for input(s): CHOL, HDL, LDLCALC, TRIG, CHOLHDL, LDLDIRECT in the last 72 hours. Thyroid Function Tests: No results for input(s): TSH, T4TOTAL, FREET4, T3FREE, THYROIDAB in the last 72 hours. Anemia Panel: No results for input(s): VITAMINB12, FOLATE, FERRITIN, TIBC, IRON, RETICCTPCT in the last 72 hours. Sepsis Labs: Recent Labs  Lab 08/28/20 0958 08/28/20 0959  PROCALCITON 0.24  --   LATICACIDVEN  --  1.8    Recent Results (from the past 240 hour(s))  Blood culture (routine single)     Status: None (Preliminary result)   Collection Time:  08/28/20  9:59 AM   Specimen: BLOOD  Result Value Ref Range Status   Specimen Description BLOOD RAC  Final   Special Requests   Final    BOTTLES DRAWN AEROBIC AND ANAEROBIC Blood Culture adequate volume   Culture   Final    NO GROWTH < 24 HOURS Performed at [facility], [address] [place], [place] [address]    Report Status PENDING  Incomplete  Respiratory Panel by RT PCR (Flu A&B, Covid) - Nasopharyngeal Swab     Status: None   Collection Time: 08/28/20  9:59 AM   Specimen: Nasopharyngeal Swab  Result Value Ref Range Status   SARS Coronavirus 2 by RT PCR NEGATIVE NEGATIVE Final    Comment: (NOTE) SARS-CoV-2 target nucleic acids are NOT DETECTED.  The SARS-CoV-2 RNA is generally detectable in upper respiratoy specimens during the acute phase of infection. The  lowest concentration of SARS-CoV-2 viral copies this assay can detect is 131 copies/mL. A negative result does not preclude SARS-Cov-2 infection and should not be used as the sole basis for treatment or other patient management decisions. A negative result may occur with  improper specimen collection/handling, submission of specimen other than nasopharyngeal swab, presence of viral mutation(s) within the areas targeted by this assay, and inadequate number of viral copies (<131 copies/mL). A negative result must be combined with clinical observations, patient history, and epidemiological information. The expected result is Negative.  Fact Sheet for Patients:  PinkCheek.be  Fact Sheet for Healthcare Providers:  GravelBags.it  This test is no t yet approved or cleared by the [place] FDA and  has been authorized for detection and/or diagnosis of SARS-CoV-2 by FDA under an Emergency Use Authorization (EUA). This EUA will remain  in effect (meaning this test can be used) for the duration of the COVID-19 declaration under Section 564(b)(1) of the Act, 21 U.S.C. section 360bbb-3(b)(1), unless the authorization is terminated or revoked sooner.     Influenza A by PCR NEGATIVE NEGATIVE Final   Influenza B by PCR NEGATIVE NEGATIVE Final    Comment: (NOTE) The Xpert Xpress SARS-CoV-2/FLU/RSV assay is intended as an aid in  the diagnosis of influenza from Nasopharyngeal swab specimens and  should not be used as a sole basis for treatment. Nasal washings and  aspirates are unacceptable for Xpert Xpress SARS-CoV-2/FLU/RSV  testing.  Fact Sheet for Patients: PinkCheek.be  Fact Sheet for Healthcare Providers: GravelBags.it  This test is not yet approved or cleared by the [place] FDA and  has been authorized for detection and/or diagnosis of SARS-CoV-2 by  FDA under  an Emergency Use Authorization (EUA). This EUA will remain  in effect (meaning this test can be used) for the duration of the  Covid-19 declaration under Section 564(b)(1) of the Act, 21  U.S.C. section 360bbb-3(b)(1), unless the authorization is  terminated or revoked. Performed at [facility], [address], [place], [place] [address]       Radiology Studies: [facility] Chest [place] 1 View  Result Date: 08/28/2020 CLINICAL DATA:  Sepsis.  Shortness of breath. EXAM: PORTABLE CHEST 1 VIEW COMPARISON:  08/05/2020 FINDINGS: Patient rotated right. Midline trachea. Mild cardiomegaly. Atherosclerosis in the transverse aorta. No pleural effusion or pneumothorax. Pulmonary interstitial prominence is slightly increased. Subtle right lower lobe opacity is again suspected. IMPRESSION: Interstitial thickening, felt to be slightly increased. Primarily the sequelae of smoking/chronic bronchitis. Developing mild pulmonary venous congestion cannot be excluded. Lateral right lower lobe opacity, which could represent residual or  recurrent pneumonia. Aortic Atherosclerosis (ICD10-I70.0). Electronically Signed   By: Abigail Miyamoto M.D.   On: 08/28/2020 10:40     Scheduled Meds: . amLODipine  5 mg Oral Daily  . aspirin EC  81 mg Oral Daily  . azithromycin  250 mg Oral Daily  . calcium acetate  1,334 mg Oral TID WC  . carvedilol  3.125 mg Oral BID WC  . furosemide  80 mg Oral Daily  . heparin  5,000 Units Subcutaneous Q8H  . hydrALAZINE  50 mg Oral TID  . ipratropium-albuterol  3 mL Nebulization Q4H WA  . lidocaine  1 patch Transdermal Q24H  . methylPREDNISolone (SOLU-MEDROL) injection  40 mg Intravenous Q12H  . mirtazapine  15 mg Oral QHS  . oxybutynin  15 mg Oral Daily  . pravastatin  40 mg Oral QHS  . sertraline  50 mg Oral QHS  . sodium chloride flush  3 mL Intravenous Q12H   Continuous Infusions: . sodium chloride       LOS: 0 days     Enzo Bi, MD [facility] If 7PM-7AM, please  contact night-coverage 08/29/2020, 6:24 PM

## 2020-08-30 ENCOUNTER — Inpatient Hospital Stay: Payer: Medicare Other

## 2020-08-30 DIAGNOSIS — D6489 Other specified anemias: Secondary | ICD-10-CM

## 2020-08-30 DIAGNOSIS — I350 Nonrheumatic aortic (valve) stenosis: Secondary | ICD-10-CM | POA: Diagnosis not present

## 2020-08-30 DIAGNOSIS — N186 End stage renal disease: Secondary | ICD-10-CM | POA: Diagnosis not present

## 2020-08-30 DIAGNOSIS — Z992 Dependence on renal dialysis: Secondary | ICD-10-CM

## 2020-08-30 DIAGNOSIS — I214 Non-ST elevation (NSTEMI) myocardial infarction: Secondary | ICD-10-CM

## 2020-08-30 DIAGNOSIS — J9621 Acute and chronic respiratory failure with hypoxia: Secondary | ICD-10-CM | POA: Diagnosis not present

## 2020-08-30 LAB — BASIC METABOLIC PANEL
Anion gap: 16 — ABNORMAL HIGH (ref 5–15)
BUN: 62 mg/dL — ABNORMAL HIGH (ref 8–23)
CO2: 29 mmol/L (ref 22–32)
Calcium: 9.1 mg/dL (ref 8.9–10.3)
Chloride: 92 mmol/L — ABNORMAL LOW (ref 98–111)
Creatinine, Ser: 6.49 mg/dL — ABNORMAL HIGH (ref 0.44–1.00)
GFR, Estimated: 6 mL/min — ABNORMAL LOW (ref >60)
Glucose, Bld: 114 mg/dL — ABNORMAL HIGH (ref 70–99)
Potassium: 5.2 mmol/L — ABNORMAL HIGH (ref 3.5–5.1)
Sodium: 137 mmol/L (ref 135–145)

## 2020-08-30 LAB — CBC
HCT: 25.7 % — ABNORMAL LOW (ref 36.0–46.0)
HCT: 25.1 % — ABNORMAL LOW (ref 36.0–46.0)
Hemoglobin: 8.3 g/dL — ABNORMAL LOW (ref 12.0–15.0)
Hemoglobin: 8.2 g/dL — ABNORMAL LOW (ref 12.0–15.0)
MCH: 29.7 pg (ref 26.0–34.0)
MCH: 29.7 pg (ref 26.0–34.0)
MCHC: 32.3 g/dL (ref 30.0–36.0)
MCHC: 32.7 g/dL (ref 30.0–36.0)
MCV: 92.1 fL (ref 80.0–100.0)
MCV: 90.9 fL (ref 80.0–100.0)
Platelets: 169 10*3/uL (ref 150–400)
Platelets: 190 10*3/uL (ref 150–400)
RBC: 2.79 MIL/uL — ABNORMAL LOW (ref 3.87–5.11)
RBC: 2.76 MIL/uL — ABNORMAL LOW (ref 3.87–5.11)
RDW: 16.3 % — ABNORMAL HIGH (ref 11.5–15.5)
RDW: 16.4 % — ABNORMAL HIGH (ref 11.5–15.5)
WBC: 13.5 10*3/uL — ABNORMAL HIGH (ref 4.0–10.5)
WBC: 16.1 10*3/uL — ABNORMAL HIGH (ref 4.0–10.5)
nRBC: 0 % (ref 0.0–0.2)
nRBC: 0 % (ref 0.0–0.2)

## 2020-08-30 LAB — HEPARIN LEVEL (UNFRACTIONATED)
Heparin Unfractionated: 0.95 IU/mL — ABNORMAL HIGH (ref 0.30–0.70)
Heparin Unfractionated: 0.58 IU/mL (ref 0.30–0.70)
Heparin Unfractionated: 0.48 IU/mL (ref 0.30–0.70)

## 2020-08-30 LAB — BRAIN NATRIURETIC PEPTIDE: B Natriuretic Peptide: 1601.9 pg/mL — ABNORMAL HIGH (ref 0.0–100.0)

## 2020-08-30 LAB — TROPONIN I (HIGH SENSITIVITY)
Troponin I (High Sensitivity): 706 ng/L (ref <18)
Troponin I (High Sensitivity): 1148 ng/L (ref <18)
Troponin I (High Sensitivity): 1497 ng/L (ref <18)

## 2020-08-30 LAB — MAGNESIUM: Magnesium: 2.8 mg/dL — ABNORMAL HIGH (ref 1.7–2.4)

## 2020-08-30 LAB — GLUCOSE, CAPILLARY: Glucose-Capillary: 199 mg/dL — ABNORMAL HIGH (ref 70–99)

## 2020-08-30 MED ORDER — EPOETIN ALFA 10000 UNIT/ML IJ SOLN
10000.0000 [IU] | INTRAMUSCULAR | Status: DC
  Administered 2020-08-31 – 2020-09-02 (×2): 10000 [IU] via INTRAVENOUS
  Filled 2020-08-30: qty 1

## 2020-08-30 MED ORDER — ISOSORBIDE MONONITRATE ER 30 MG PO TB24
30.0000 mg | ORAL_TABLET | Freq: Every day | ORAL | Status: DC
  Administered 2020-08-31 – 2020-09-01 (×2): 30 mg via ORAL
  Filled 2020-08-30 (×2): qty 1

## 2020-08-30 MED ORDER — HYDROCODONE-ACETAMINOPHEN 7.5-325 MG PO TABS
1.0000 | ORAL_TABLET | Freq: Four times a day (QID) | ORAL | Status: DC | PRN
  Administered 2020-08-30 – 2020-09-03 (×5): 1 via ORAL
  Filled 2020-08-30 (×5): qty 1

## 2020-08-30 MED ORDER — CHLORHEXIDINE GLUCONATE CLOTH 2 % EX PADS
6.0000 | MEDICATED_PAD | Freq: Every day | CUTANEOUS | Status: DC
  Administered 2020-08-31: 6 via TOPICAL

## 2020-08-30 MED ORDER — NITROGLYCERIN 0.4 MG SL SUBL
0.4000 mg | SUBLINGUAL_TABLET | SUBLINGUAL | Status: DC | PRN
  Administered 2020-08-30 – 2020-09-02 (×6): 0.4 mg via SUBLINGUAL
  Filled 2020-08-30 (×3): qty 1

## 2020-08-30 MED ORDER — FUROSEMIDE 10 MG/ML IJ SOLN
100.0000 mg | Freq: Once | INTRAVENOUS | Status: DC
  Filled 2020-08-30: qty 10

## 2020-08-30 MED ORDER — FUROSEMIDE 10 MG/ML IJ SOLN
80.0000 mg | Freq: Once | INTRAMUSCULAR | Status: AC
  Administered 2020-08-30: 80 mg via INTRAVENOUS

## 2020-08-30 MED ORDER — FUROSEMIDE 10 MG/ML IJ SOLN
20.0000 mg | Freq: Once | INTRAMUSCULAR | Status: AC
  Administered 2020-08-30: 20 mg via INTRAVENOUS

## 2020-08-30 MED ORDER — NITROGLYCERIN 0.4 MG SL SUBL
0.4000 mg | SUBLINGUAL_TABLET | SUBLINGUAL | Status: DC | PRN
  Administered 2020-08-30 – 2020-08-31 (×3): 0.4 mg via SUBLINGUAL
  Filled 2020-08-30 (×6): qty 1

## 2020-08-30 MED ORDER — FUROSEMIDE 10 MG/ML IJ SOLN
INTRAMUSCULAR | Status: AC
  Filled 2020-08-30: qty 12

## 2020-08-30 NOTE — Progress Notes (Signed)
PROGRESS NOTE    Phyllis [name]  VWU:[id number] DOB: 1946/10/17 DOA: 08/28/2020 PCP: [name], MD   Brief Narrative: Taken from H&P and prior notes. Phyllis Renbarger Crutchfieldis a 74 y.o.femalewith medical history significant ofESRD-HD (TTS), hypertension, hyperlipidemia, diet-controlled diabetes, COPD, asthma, stroke, GERD, gout, depression with anxiety, severe aortic stenosis, OSA, CHF, anemia, who presented with shortness of breath. Patient also developed some chest pain and elevated troponin.  Multiple comorbidities.  She is not a candidate for further ischemic work-up all any other procedure per cardiology. See cardiology PA note for the summary of her recurrent hospitalizations and comorbidities. Palliative care was consulted today.  Subjective: Patient denies any chest pain when seen today.  Caregiver was in the room.  Continues to experience intermittent chest pain.  Has a history of chronic costochondritis.  Multiple other comorbidities.  She was saturating 100% on 2L when I entered the room, took off the oxygen and she was able to maintain saturation.  Assessment & Plan:   Principal Problem:   Acute on chronic respiratory failure with hypoxia (HCC) Active Problems:   ESRD (end stage renal disease) on dialysis (HCC)   Hyperlipidemia   CVA (cerebral vascular accident) ([place])   Chronic diastolic heart failure (HCC)   HTN (hypertension)   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-stage renal disease) ([place])   Flash pulmonary edema (HCC)   COPD exacerbation (HCC)   Depression with anxiety   Type II diabetes mellitus with renal manifestations ([place])   Acute respiratory failure with hypoxia (HCC)  Elevated troponin with chest pain.  Cardiology was consulted with concern of NSTEMI.  Patient also has an history of chronic costochondritis.  Troponin peaked at 1148.  Per cardiology note patient has multiple comorbidities along with a prior PEA arrest requiring resuscitation.  She is not a candidate  for any further ischemic work-up or intervention.  They were recommending palliative care consult which was placed. -Continue with medical management.  Acute hypoxic respiratory failure/COPD exacerbation.  Resolved.  Initially requiring BiPAP. Patient has multiple hospital visits with similar symptoms.  Most likely secondary to volume overload.  Symptoms improved with dialysis. She was saturating 100% on 2 L of oxygen, discontinue oxygen and she was able to maintain saturation in high 90s. -Continue with chest PT. -Continue with incentive spirometry and flutter valve. -Solu-Medrol switched to prednisone 40 mg daily for another 3 days. -Continue azithromycin for total of 5 days. -Continue with DuoNeb.  Severe aortic stenosis.  Not a candidate for any surgical intervention or TAVR. -Continue with medical management. -Palliative care consult.  ESRD (end stage renal disease) on dialysis ([place]).  Patient normally had dialysis Tuesday, Thursday and Saturday we then added dialysis every other week on Monday. -Continue dialysis per nephrology protocol and her schedule.  Hyperkalemia.  Potassium at 5.2 which seems stable. -Going for dialysis today. -Losartan was discontinued per nephrology.  Hyperlipidemia -cont home statin  CVA (cerebral vascular accident) ([place]) -Continue aspirin, pravastatin  Chronic diastolic heart failure ([place]): 2D echo on 04/20/2020 showed EF 60-65% with grade 1 diastolic dysfunction.  -Volume management per renal by dialysis -cont home Lasix 80 mg daily  HTN (hypertension) -IV hydralazine as needed -Continue home amlodipine, Coreg, hydralazine, and Lasix -d/c losartan, per nephrology  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-stage renal disease) ([place]):  -EPO with HD  Chronic pain on chronic opioids Rx confirmed on PDMP -continue home Norco PRN  Depression with anxiety --Pt reported significant anxiety which causes dyspnea PLAN: -continue home Remeron and  Sertraline. -Continue Atarax  PRN for anxiety -avoid benzo since already on opioids  Diet controlledType II diabetes mellitus with renal manifestations [facility]): Recent A1c 5.1, well controlled. Patient is not taking medications at home. -no need for fingersticks.  Objective: Vitals:   08/30/20 1415 08/30/20 1430 08/30/20 1445 08/30/20 1500  BP: (!) 113/53 (!) 104/52 (!) 97/48 (!) 119/58  Pulse: 89 93 99 99  Resp:      Temp:      TempSrc:      SpO2: 100% 100% 100% 100%  Weight:      Height:        Intake/Output Summary (Last 24 hours) at 08/30/2020 1526 Last data filed at 08/30/2020 0300 Gross per 24 hour  Intake 76.91 ml  Output 0 ml  Net 76.91 ml   Filed Weights   08/28/20 1813 08/29/20 0528 08/30/20 0402  Weight: 61.6 kg 61.7 kg 61.6 kg    Examination:  General exam: Appears calm and comfortable  Respiratory system: Clear to auscultation. Respiratory effort normal. Cardiovascular system: S1 & S2 heard, RRR.  Systolic murmur. Gastrointestinal system: Soft, nontender, nondistended, bowel sounds positive. Central nervous system: Alert and oriented. No focal neurological deficits. Extremities: No edema, no cyanosis, pulses intact and symmetrical. Psychiatry: Judgement and insight appear normal.    DVT prophylaxis: Heparin Code Status: Full Family Communication: Caregiver was updated at bedside Disposition Plan:  Status is: Inpatient  Remains inpatient appropriate because:Inpatient level of care appropriate due to severity of illness   Dispo: The patient is from: Home              Anticipated d/c is to: Home              Anticipated d/c date is: 1 day              Patient currently is not medically stable to d/c.  Consultants:   Cardiology  Nephrology  Palliative care  Procedures:  Antimicrobials:   Data Reviewed: I have personally reviewed following labs and imaging studies  CBC: Recent Labs  Lab 08/28/20 0958 08/29/20 0440 08/30/20 0441  08/30/20 1003  WBC 12.2* 7.2 13.5* 16.1*  NEUTROABS 9.3*  --   --   --   HGB 10.1* 8.3* 8.3* 8.2*  HCT 30.7* 25.1* 25.7* 25.1*  MCV 89.0 88.4 92.1 90.9  PLT 208 140* 169 734   Basic Metabolic Panel: Recent Labs  Lab 08/28/20 0958 08/29/20 0440 08/30/20 0441  NA 130* 136 137  K 4.8 5.2* 5.2*  CL 90* 93* 92*  CO2 25 27 29   GLUCOSE 182* 158* 114*  BUN 66* 36* 62*  CREATININE 7.00* 4.86* 6.49*  CALCIUM 8.5* 8.6* 9.1  MG  --   --  2.8*   GFR: Estimated Creatinine Clearance: 5.9 mL/min (A) (by C-G formula based on SCr of 6.49 mg/dL (H)). Liver Function Tests: Recent Labs  Lab 08/28/20 0958  AST 17  ALT 12  ALKPHOS 118  BILITOT 0.8  PROT 7.0  ALBUMIN 4.1   No results for input(s): LIPASE, AMYLASE in the last 168 hours. No results for input(s): AMMONIA in the last 168 hours. Coagulation Profile: Recent Labs  Lab 08/28/20 0958  INR 1.0   Cardiac Enzymes: No results for input(s): CKTOTAL, CKMB, CKMBINDEX, TROPONINI in the last 168 hours. BNP (last 3 results) No results for input(s): PROBNP in the last 8760 hours. HbA1C: No results for input(s): HGBA1C in the last 72 hours. CBG: Recent Labs  Lab 08/28/20 1953  GLUCAP 205*  Lipid Profile: No results for input(s): CHOL, HDL, LDLCALC, TRIG, CHOLHDL, LDLDIRECT in the last 72 hours. Thyroid Function Tests: No results for input(s): TSH, T4TOTAL, FREET4, T3FREE, THYROIDAB in the last 72 hours. Anemia Panel: No results for input(s): VITAMINB12, FOLATE, FERRITIN, TIBC, IRON, RETICCTPCT in the last 72 hours. Sepsis Labs: Recent Labs  Lab 08/28/20 0958 08/28/20 0959  PROCALCITON 0.24  --   LATICACIDVEN  --  1.8    Recent Results (from the past 240 hour(s))  Blood culture (routine single)     Status: None (Preliminary result)   Collection Time: 08/28/20  9:59 AM   Specimen: BLOOD  Result Value Ref Range Status   Specimen Description BLOOD RAC  Final   Special Requests   Final    BOTTLES DRAWN AEROBIC AND  ANAEROBIC Blood Culture adequate volume   Culture   Final    NO GROWTH 2 DAYS Performed at [facility], [address], [place], [place] [address]    Report Status PENDING  Incomplete  Respiratory Panel by RT PCR (Flu A&B, Covid) - Nasopharyngeal Swab     Status: None   Collection Time: 08/28/20  9:59 AM   Specimen: Nasopharyngeal Swab  Result Value Ref Range Status   SARS Coronavirus 2 by RT PCR NEGATIVE NEGATIVE Final    Comment: (NOTE) SARS-CoV-2 target nucleic acids are NOT DETECTED.  The SARS-CoV-2 RNA is generally detectable in upper respiratoy specimens during the acute phase of infection. The lowest concentration of SARS-CoV-2 viral copies this assay can detect is 131 copies/mL. A negative result does not preclude SARS-Cov-2 infection and should not be used as the sole basis for treatment or other patient management decisions. A negative result may occur with  improper specimen collection/handling, submission of specimen other than nasopharyngeal swab, presence of viral mutation(s) within the areas targeted by this assay, and inadequate number of viral copies (<131 copies/mL). A negative result must be combined with clinical observations, patient history, and epidemiological information. The expected result is Negative.  Fact Sheet for Patients:  PinkCheek.be  Fact Sheet for Healthcare Providers:  GravelBags.it  This test is no t yet approved or cleared by the [place] FDA and  has been authorized for detection and/or diagnosis of SARS-CoV-2 by FDA under an Emergency Use Authorization (EUA). This EUA will remain  in effect (meaning this test can be used) for the duration of the COVID-19 declaration under Section 564(b)(1) of the Act, 21 U.S.C. section 360bbb-3(b)(1), unless the authorization is terminated or revoked sooner.     Influenza A by PCR NEGATIVE NEGATIVE Final   Influenza B by PCR  NEGATIVE NEGATIVE Final    Comment: (NOTE) The Xpert Xpress SARS-CoV-2/FLU/RSV assay is intended as an aid in  the diagnosis of influenza from Nasopharyngeal swab specimens and  should not be used as a sole basis for treatment. Nasal washings and  aspirates are unacceptable for Xpert Xpress SARS-CoV-2/FLU/RSV  testing.  Fact Sheet for Patients: PinkCheek.be  Fact Sheet for Healthcare Providers: GravelBags.it  This test is not yet approved or cleared by the [place] FDA and  has been authorized for detection and/or diagnosis of SARS-CoV-2 by  FDA under an Emergency Use Authorization (EUA). This EUA will remain  in effect (meaning this test can be used) for the duration of the  Covid-19 declaration under Section 564(b)(1) of the Act, 21  U.S.C. section 360bbb-3(b)(1), unless the authorization is  terminated or revoked. Performed at [facility], [address] [place],  [place] [address]      Radiology Studies: No results found.  Scheduled Meds: . amLODipine  5 mg Oral Daily  . aspirin EC  81 mg Oral Daily  . azithromycin  250 mg Oral Daily  . calcium acetate  1,334 mg Oral TID WC  . carvedilol  3.125 mg Oral BID WC  . [START ON 08/31/2020] epoetin (EPOGEN/PROCRIT) injection  10,000 Units Intravenous Q T,Th,Sa-HD  . furosemide  80 mg Oral Daily  . hydrALAZINE  50 mg Oral TID  . ipratropium-albuterol  3 mL Nebulization Q4H WA  . lidocaine  1 patch Transdermal Q24H  . mirtazapine  15 mg Oral QHS  . oxybutynin  15 mg Oral Daily  . pravastatin  40 mg Oral QHS  . predniSONE  40 mg Oral Q breakfast  . sertraline  50 mg Oral QHS  . sodium chloride flush  3 mL Intravenous Q12H   Continuous Infusions: . sodium chloride    . heparin 550 Units/hr (08/30/20 0631)     LOS: 1 day   Time spent: 40 minutes.  Lorella Nimrod, MD [facility]  If 7PM-7AM, please contact  night-coverage [email or web address]  08/30/2020, 3:26 PM   This record has been created using Systems analyst. Errors have been sought and corrected,but may not always be located. Such creation errors do not reflect on the standard of care.

## 2020-08-30 NOTE — Progress Notes (Signed)
   08/29/20 2146  Assess: MEWS Score  Temp 98.3 F (36.8 C)  BP (!) 142/64  Pulse Rate (!) 111  Resp 20  SpO2 99 %  O2 Device Nasal Cannula  O2 Flow Rate (L/min) 4 L/min  Assess: MEWS Score  MEWS Temp 0  MEWS Systolic 0  MEWS Pulse 2  MEWS RR 0  MEWS LOC 0  MEWS Score 2  MEWS Score Color Yellow  Take Vital Signs  Increase Vital Sign Frequency  Yellow: Q 2hr X 2 then Q 4hr X 2, if remains yellow, continue Q 4hrs  Escalate  MEWS: Escalate Yellow: discuss with charge nurse/RN and consider discussing with provider and RRT  Notify: Charge Nurse/RN  Name of Charge Nurse/RN Notified Jake Samples  Date Charge Nurse/RN Notified 08/29/20  Time Charge Nurse/RN Notified 2200  Notify: Provider  Provider Name/Title Sharion Settler NP  Date Provider Notified 08/29/20  Time Provider Notified 2148  Notification Type Page  Notification Reason Change in status  Response See new orders  Date of Provider Response 08/29/20  Time of Provider Response 2200  Document  Patient Outcome Stabilized after interventions  Progress note created (see row info) Yes

## 2020-08-30 NOTE — Progress Notes (Signed)
[facility]  ROUNDING NOTE   Subjective:   Patient denies worsening SOB, nausea or vomiting. She reports getting extra dialysis every other week,in addition to her TTS schedule, and this is her week for additional dialysis.Will arrange for dialysis today.    Objective:  Vital signs in last 24 hours:  Temp:  [97.8 F (36.6 C)-98.7 F (37.1 C)] 98.7 F (37.1 C) (10/11 1200) Pulse Rate:  [74-117] 83 (10/11 1345) Resp:  [18-20] 20 (10/11 0402) BP: (94-142)/(40-79) 115/51 (10/11 1345) SpO2:  [95 %-100 %] 100 % (10/11 1345) Weight:  [61.6 kg] 61.6 kg (10/11 0402)  Weight change: -0.018 kg Filed Weights   08/28/20 1813 08/29/20 0528 08/30/20 0402  Weight: 61.6 kg 61.7 kg 61.6 kg    Intake/Output: I/O last 3 completed shifts: In: 76.9 [I.V.:76.9] Out: 0    Intake/Output this shift:  No intake/output data recorded.  Physical Exam: General: Resting in bed,awake,alert,caregiver at the bedside  Head: Normocephalic, atraumatic. Moist oral mucosal membranes  Eyes: Anicteric  Neck: Supple, trachea midline  Lungs:  Crackles at bases bilaterally , 3L [place] O2   Heart: Regular rate and rhythm, +murmur  Abdomen:  Soft, nontender,   Extremities: No peripheral edema.  Neurologic: Alert,oriented,moving all four extremities  Skin: No acute  Lesions or rashes  Access: Left AVF +thrill,+ bruit    Basic Metabolic Panel: Recent Labs  Lab 08/28/20 0958 08/29/20 0440 08/30/20 0441  NA 130* 136 137  K 4.8 5.2* 5.2*  CL 90* 93* 92*  CO2 25 27 29   GLUCOSE 182* 158* 114*  BUN 66* 36* 62*  CREATININE 7.00* 4.86* 6.49*  CALCIUM 8.5* 8.6* 9.1  MG  --   --  2.8*    Liver Function Tests: Recent Labs  Lab 08/28/20 0958  AST 17  ALT 12  ALKPHOS 118  BILITOT 0.8  PROT 7.0  ALBUMIN 4.1   No results for input(s): LIPASE, AMYLASE in the last 168 hours. No results for input(s): AMMONIA in the last 168 hours.  CBC: Recent Labs  Lab 08/28/20 0958 08/29/20 0440  08/30/20 0441 08/30/20 1003  WBC 12.2* 7.2 13.5* 16.1*  NEUTROABS 9.3*  --   --   --   HGB 10.1* 8.3* 8.3* 8.2*  HCT 30.7* 25.1* 25.7* 25.1*  MCV 89.0 88.4 92.1 90.9  PLT 208 140* 169 190    Cardiac Enzymes: No results for input(s): CKTOTAL, CKMB, CKMBINDEX, TROPONINI in the last 168 hours.  BNP: Invalid input(s): POCBNP  CBG: Recent Labs  Lab 08/28/20 1953  GLUCAP 205*    Microbiology: Results for orders placed or performed during the hospital encounter of 08/28/20  Blood culture (routine single)     Status: None (Preliminary result)   Collection Time: 08/28/20  9:59 AM   Specimen: BLOOD  Result Value Ref Range Status   Specimen Description BLOOD RAC  Final   Special Requests   Final    BOTTLES DRAWN AEROBIC AND ANAEROBIC Blood Culture adequate volume   Culture   Final    NO GROWTH 2 DAYS Performed at [facility], [address] [place], [place] [address]    Report Status PENDING  Incomplete  Respiratory Panel by RT PCR (Flu A&B, Covid) - Nasopharyngeal Swab     Status: None   Collection Time: 08/28/20  9:59 AM   Specimen: Nasopharyngeal Swab  Result Value Ref Range Status   SARS Coronavirus 2 by RT PCR NEGATIVE NEGATIVE Final    Comment: (NOTE) SARS-CoV-2 target  nucleic acids are NOT DETECTED.  The SARS-CoV-2 RNA is generally detectable in upper respiratoy specimens during the acute phase of infection. The lowest concentration of SARS-CoV-2 viral copies this assay can detect is 131 copies/mL. A negative result does not preclude SARS-Cov-2 infection and should not be used as the sole basis for treatment or other patient management decisions. A negative result may occur with  improper specimen collection/handling, submission of specimen other than nasopharyngeal swab, presence of viral mutation(s) within the areas targeted by this assay, and inadequate number of viral copies (<131 copies/mL). A negative result must be combined with  clinical observations, patient history, and epidemiological information. The expected result is Negative.  Fact Sheet for Patients:  PinkCheek.be  Fact Sheet for Healthcare Providers:  GravelBags.it  This test is no t yet approved or cleared by the [place] FDA and  has been authorized for detection and/or diagnosis of SARS-CoV-2 by FDA under an Emergency Use Authorization (EUA). This EUA will remain  in effect (meaning this test can be used) for the duration of the COVID-19 declaration under Section 564(b)(1) of the Act, 21 U.S.C. section 360bbb-3(b)(1), unless the authorization is terminated or revoked sooner.     Influenza A by PCR NEGATIVE NEGATIVE Final   Influenza B by PCR NEGATIVE NEGATIVE Final    Comment: (NOTE) The Xpert Xpress SARS-CoV-2/FLU/RSV assay is intended as an aid in  the diagnosis of influenza from Nasopharyngeal swab specimens and  should not be used as a sole basis for treatment. Nasal washings and  aspirates are unacceptable for Xpert Xpress SARS-CoV-2/FLU/RSV  testing.  Fact Sheet for Patients: PinkCheek.be  Fact Sheet for Healthcare Providers: GravelBags.it  This test is not yet approved or cleared by the [place] FDA and  has been authorized for detection and/or diagnosis of SARS-CoV-2 by  FDA under an Emergency Use Authorization (EUA). This EUA will remain  in effect (meaning this test can be used) for the duration of the  Covid-19 declaration under Section 564(b)(1) of the Act, 21  U.S.C. section 360bbb-3(b)(1), unless the authorization is  terminated or revoked. Performed at [facility], [address] [place], [place] [address]     Coagulation Studies: Recent Labs    08/28/20 0958  LABPROT 13.0  INR 1.0    Urinalysis: No results for input(s): COLORURINE, LABSPEC, PHURINE, GLUCOSEU, HGBUR,  BILIRUBINUR, KETONESUR, PROTEINUR, UROBILINOGEN, NITRITE, LEUKOCYTESUR in the last 72 hours.  Invalid input(s): APPERANCEUR    Imaging: No results found.   Medications:   . sodium chloride    . heparin 550 Units/hr (08/30/20 0631)   . amLODipine  5 mg Oral Daily  . aspirin EC  81 mg Oral Daily  . azithromycin  250 mg Oral Daily  . calcium acetate  1,334 mg Oral TID WC  . carvedilol  3.125 mg Oral BID WC  . [START ON 08/31/2020] epoetin (EPOGEN/PROCRIT) injection  10,000 Units Intravenous Q T,Th,Sa-HD  . furosemide  80 mg Oral Daily  . hydrALAZINE  50 mg Oral TID  . ipratropium-albuterol  3 mL Nebulization Q4H WA  . lidocaine  1 patch Transdermal Q24H  . mirtazapine  15 mg Oral QHS  . oxybutynin  15 mg Oral Daily  . pravastatin  40 mg Oral QHS  . predniSONE  40 mg Oral Q breakfast  . sertraline  50 mg Oral QHS  . sodium chloride flush  3 mL Intravenous Q12H   sodium chloride, acetaminophen, albuterol, dextromethorphan-guaiFENesin, hydrALAZINE, HYDROcodone-acetaminophen, hydrOXYzine, lidocaine-prilocaine, nitroGLYCERIN, ondansetron (  ZOFRAN) IV, sodium chloride flush  Assessment/ Plan:  Ms. BELLAMI FARRELLY is a 74 y.o. white female with end stage renal disease on hemodialysis, hypertension, aortic stenosis, COPD, diabetes mellitus type II, chronic costochrondritis who was admitted to [facility] on 08/28/2020 for Flash pulmonary edema ([place]) [J81.0] Hypoxia [R09.02] Acute on chronic respiratory failure with hypoxia ([place]) [J96.21] Acute respiratory failure with hypoxia ([place]) [J96.01]  [facility] TTS [place]. Left AVF 60kg  1. End Stage Renal Disease with hyperkalemia: emergent hemodialysis treatment on admission.  Seems patient has not been getting to her dry weight as an outpatient. Leaving at [place].   Patient will get dialysis today, as it is her week to get 4 dialysis treatments. Will continue TTS schedule from tomorrow  2. Hypertension: -On Amlodipine,Furosemide and  Hydralazine - Blood pressure readings within low normal range  3. Anemia with chronic kidney disease:  Hemoglobin 8.2 today - Epogen 10,000 units with dialysis TTS  4. Secondary Hyperparathyroidism:  - [place] with meals TID   LOS: 1 Nevan Creighton 10/11/20211:55 PM

## 2020-08-30 NOTE — Progress Notes (Signed)
ANTICOAGULATION CONSULT NOTE - Initial Consult  Pharmacy Consult for Heparin Drip Indication: chest pain/ACS  Allergies  Allergen Reactions  . Enalapril Maleate Other (See Comments)    Other reaction(s): Headache  . Nitrofurantoin Swelling and Rash    Other Reaction: swelling of body  . Sulfamethoxazole-Trimethoprim Swelling  . 2,4-D Dimethylamine (Amisol) Rash and Other (See Comments)    Other Reaction: h/a  . Baclofen Other (See Comments) and Nausea Only    lightheadness ,drowsiness , muscle weakness , twitching in hands   . Neosporin [Neomycin-Bacitracin Zn-Polymyx] Other (See Comments) and Rash    Other Reaction: irritation Skin irritation   . Quinine Nausea And Vomiting, Rash and Other (See Comments)    Other Reaction: Vomiting, rash, h/a, vision  . Ultram [Tramadol] Palpitations  . Zocor [Simvastatin] Other (See Comments) and Rash    Other Reaction: muscle spasms Muscle pain and spasms  . Bactrim [Sulfamethoxazole-Trimethoprim] Swelling  . Levodopa Other (See Comments)    Reaction: unknown  . Macrodantin [Nitrofurantoin Macrocrystal] Swelling  . Quinine Derivatives Other (See Comments)    Vertigo,nausea vomiting blurred vision headache ears sensitivity     Patient Measurements: Height: 4\' 10"  (147.3 cm) Weight: 61.6 kg (135 lb 12.2 oz) IBW/kg (Calculated) : 40.9 Heparin Dosing Weight: 54.3 kg  Vital Signs: Temp: 98.7 F (37.1 C) (10/11 1200) Temp Source: Oral (10/11 1200) BP: 119/58 (10/11 1500) Pulse Rate: 99 (10/11 1500)  Labs: Recent Labs    08/28/20 0958 08/28/20 0958 08/29/20 0440 08/29/20 0440 08/29/20 1842 08/29/20 2214 08/30/20 0200 08/30/20 0441 08/30/20 1003 08/30/20 1430  HGB 10.1*   < > 8.3*   < >  --   --   --  8.3* 8.2*  --   HCT 30.7*   < > 25.1*  --   --   --   --  25.7* 25.1*  --   PLT 208   < > 140*  --   --   --   --  169 190  --   APTT 28  --   --   --   --   --   --   --   --   --   LABPROT 13.0  --   --   --   --   --   --    --   --   --   INR 1.0  --   --   --   --   --   --   --   --   --   HEPARINUNFRC  --   --   --   --   --   --   --  0.95*  --  0.58  CREATININE 7.00*  --  4.86*  --   --   --   --  6.49*  --   --   TROPONINIHS 32*  --   --   --    < > 461* 706* 1,148*  --   --    < > = values in this interval not displayed.    Estimated Creatinine Clearance: 5.9 mL/min (A) (by C-G formula based on SCr of 6.49 mg/dL (H)).   Medical History: Past Medical History:  Diagnosis Date  . (HFpEF) heart failure with preserved ejection fraction ([place])    a. TTE 01/2014: nl LV sys fxn, no valvular abnormalities; b. TTE 11/16: nl EF, mild LVH;  c. 04/2019 Echo: EF 60-65%. DD. Nl RV fxn. Mod AS. Mild-mod LAE,  Sev mitral annular Ca2+ w/o stenosis.  . Allergy   . Anemia of chronic disease   . Anxiety   . Aortic atherosclerosis ([place])   . Asthma   . Chronic back pain   . COPD (chronic obstructive pulmonary disease) ([place])   . Diabetes mellitus with complication ([place])   . ESRD on hemodialysis ([place])    a. Tues/Sat; b. 2/2 small kidneys  . Essential hypertension   . Fistula    lower left arm  . GERD (gastroesophageal reflux disease)   . Gout   . History of exercise stress test    a. 01/2014: no evidence of ischemia; b. Lexiscan 08/2015: no sig ischemia, severe GI uptake artifact, low risk; c. CPET @ [facility] 09/2016: exercised 3 min 12 sec on bike without incline, 2.28 METs, VO2 of 8.1, 48% of predicted, indicating mod to sev functional impairment, evidence of blunted HR, stroke volume, and BP augmentation as well as ventilation-perfusion mismatch with exercise  . HLD (hyperlipidemia)   . Non-obstructive Carotid arterial disease ([place])    a. 12/2017: <50% bilat ICA dzs.  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 Severe aortic stenosis    a. by 09/2019 echo b. 04/2019 Echo: Mod AS.  Marland Kitchen Sleep apnea    Assessment: Patient is a 74yo female presented with chest pain. PMH chronic chest pain with history of  costochondritis, sev aortic stenosis, ESRD on HD (TTS), recurrent flash pulmonary edema, chronic anemia (epogen 10,000 units with dialysis), HFpEF, COPD, and asthma. 09/2019 LHC revealed severe left main and ostial LAD disease. She had been considered extremely high risk for PCI and deemed not a candidate for CABG/TAVR. Pharmacy consulted for Heparin dosing and monitoring.   Troponin 32>266>461>706>1148 Baseline aPTT 13, INR 1, Hgb 10.1, Plt 208 Hgb and Plt trending down - monitor  10/11 HL @0441  0.95; supratherapeutic, rate decreased to 550units/hr 10/11 HL @1430  0.58; therapeutic x1  Goal of Therapy:  Heparin level 0.3-0.7 units/ml Monitor platelets by anticoagulation protocol: Yes   Plan:  HL therapeutic, will continue current rate at 550 units/hr Re-check anti-Xa level in 8 hours Continue to monitor H&H and platelets    Sherilyn Banker, PharmD Pharmacy Resident  08/30/2020 3:41 PM

## 2020-08-30 NOTE — Progress Notes (Signed)
Patient returned to room from HD. Report called: 1443mL removed during HD, Oxygen started during d/t stat in 80s. HD stopped in middle d/t hypotension. Vitals checked and WDL upon return to room after HD. RT spoke with aid at patient's bedside d/t patient complaint of neck hurting from hospital supplied Cpap. Aid to go to patient's home and bring back patient's Cpap and mask from home. RT will assess machine for compatibility when aid returns.

## 2020-08-30 NOTE — Progress Notes (Signed)
Mobility Specialist - Progress Note   08/30/20 1354  Mobility  Activity Contraindicated/medical hold  Mobility performed by Mobility specialist    Pt's K+ levels currently sitting at 5.2, will hold mobility until pt is medically appropriate for session.    Kathee Delton Mobility Specialist 08/30/20, 1:56 PM

## 2020-08-30 NOTE — Consult Note (Signed)
Cardiology Consultation:   Patient ID: Phyllis [name] MRN: [id number]; DOB: 01-22-1946  Admit date: 08/28/2020 Date of Consult: 08/30/2020  Primary Care Provider: [name], MD Primary Cardiologist: Ida Rogue, MD  Primary Electrophysiologist:  None    Patient Profile:   Phyllis [name] is a 74 y.o. female with a hx of chronic chest pain, severe aortic stenosis, ESRD on HD TTS (HD 3 days per wek), recurrent flash pulmonary edema, recent respiratory/PEA arrest 09/2019, anemia of chronic disease, diffuse aortic atherosclerosis, anxiety, DM2, hypertension, hyperlipidemia, obesity, asthma, COPD, OSA, HFpEF, physical deconditioning, and who is being seen today for the evaluation of chest pain with elevated troponin / Non-STEMI at the request of Dr. Reesa Chew.  History of Present Illness:   Phyllis [name] is a 74 year old female with history of chronic chest pain felt to be secondary to costochondritis with negative stress test in the past.  She has known severe aortic valve stenosis, ESRD on HD TTS, recurrent flash pulmonary edema, recent respiratory/PEA arrest, anemia of chronic disease, aortic atherosclerosis, anxiety, DM2, hypertension, hyperlipidemia, obesity, asthma, COPD, OSA, and physical deconditioning.  On 10/06/2016, stress testing at [facility] for which she exercised for 3 minutes and 12 seconds on the bike with a peak METS of 2.28.  Evidence of blunted heart rate, stroke-volume and BP augmentation as well as ventilation perfusion mismatch with exercise.    She was seen in 2018 and noted a decline in her functional capacity since suffering a fall in 07/2017 leading to multiple staples being placed on her scalp.  She also reported intermittent, substernal chest pain. TTP, non-exertional, improved with deep inspiration, and was gone after about 30 to 45 minutes. Subsequent 09/2017 stress test showed no significant ischemia with EF estimated at 52% and ruled a lower scan. 12/2017  echo as below showed EF 55-60%, no RWMA, G2DD, mild to moderate AS, mild MS, mild LAE.   She was admitted to the hospital 05/2019 with volume overload and volume managed by hemodialysis and concern for severe AS.  She was mated to the hospital early 09/2019 with flash pulmonary edema requiring intubation for airway protection and underwent daily hemodialysis with subsequent improvement in volume status.  Repeat echo 09/2019 showed EF 60 to 65%, mild LVH, normal RVSF, mild to moderate LAE, mild MR, moderate aortic valve stenosis by gradient severe AS by valve area with mean gradient of 20.6 mmHg and a valve area of 0.68 cm.  It was recommended Lasix be changed to torsemide on nonhemodialysis days for optimization of volume management. In the setting of her aortic valve disease, narrow euvolemic window noted. She was also noted to be significantly deconditioned with recommendation to pursue aggressive work-up with physical therapy followed by outpatient evaluation by the structural heart team.   She was readmitted 09/2019 with flash pulmonary edema, requiring emergent hemodialysis.  She received PRBC x1.  She was discharged 11/18.  She returned 11/21 with respiratory distress and flash pulmonary edema, complicated by PEA arrest requiring brief episode of CPR mechanical ventilation.  She underwent emergent hemodialysis.  Initially plans were for palliative/comfort care given her comorbid conditions and overall prognosis.  However, upon extubation, she was alert and oriented and wished to pursue aggressive care.  LHC 10/14/2019 showed severe left main and ostial LAD disease.  She was not a candidate for sternotomy/CABG or high risk PCI.  Palliative care team was consulted.  She was admitted 11/2019 for costochondritis.  This was managed on pain medication with lidocaine patch.  She was  hypoxic, initially requiring BiPAP and weaned to 5 L.  It was indicated she was supposed to be on BiPAP as an outpatient.  She  was taken down to her dry weight of 58 kg after dialysis before admission.  She was seen in the office 12/19/2019.  At that time, it was noted she was losing weight over the past 3 years.  She presented with her caretaker, who reported she was not eating and missing many meals with no desire to eat.  She is very sedentary most of the day.  Her blood pressure ran low with hemodialysis with recommendation to cut amlodipine down to 5 mg daily with an extra 5 mg as needed for blood pressure.  She reported shortness of breath, though this improved with dialysis.  She was going to hemodialysis 3 days/week with an extra day every other week.  Hemodialysis improved her symptoms of shortness of breath and chronic chest pain.  It was recommended that she repeat an echo in 1 year for her aortic valve stenosis.  04/2020 echo EF 60 to 65%, G1DD, mild LAE, trivial MR,, moderate to severe aortic valve stenosis with severe calcification of the aortic valve.  Aortic valve mean gradient 23.0 mmHg. Peak gradient 44.4 mmHg.  Valve area 0.78 cm.  On 08/28/2020, she was due for hemodialysis when she suddenly became short of breath.  She reported dry cough and chest tightness.  CPAP was started by EMS and transition to BiPAP with patient reporting relief on BiPAP.  In the emergency department, BNP 697.1 with high-sensitivity troponin 32.  Lactic acid 1.8 with procalcitonin 0.24.  Potassium 4.8, creatinine 7.00, BUN 66, hemoglobin 10.1, hematocrit 30.7.  Respiratory panel negative.  Chest x-ray showed interstitial thickening, felt to be slightly increased and primarily the sequelae of smoking and chronic bronchitis.  Blood cultures pending.  Mild pulmonary venous congestion could not be excluded.  Lateral right lobe opacity was also noted, as well as aortic atherosclerosis. EKG showed NSR, 75 bpm, QRS 92 ms, QTC 472 ms, no acute ST or T changes.    Since admission, she has been undergoing hemodialysis with nephrology notes indicating  that she has not been getting to her dry weight as an outpatient. On 10/10, she complained of chest pain rated 10/10 in severity.  This chest pain has responded to sublingual nitro with some improvement in pain associated with elevated blood pressure.  Today, 08/30/2020, she reports chest pain at the time of cardiac consultation.  Per nurse, her sublingual nitro order has expired with order subsequently placed by cardiology to renew.  She reports that her chest pain feels exactly the same as reported hospitalizations and clinic visits.  The chest pain is worse with breathing difficulties and directly before hemodialysis.  Occasionally, the chest pain spreads down her left arm, which is not unusual for this chest pain and has occurred before in the past.  She reports nausea and emesis x3.  She states that her emesis looks exactly like what she ate immediately before it.  It improves with hemodialysis and sublingual nitro, as well as sitting up.  She reports progressive orthopnea, currently on 2 pillows with the head of her bed elevated to 85 degrees.  She is currently on 4 L nasal cannula oxygen and still somewhat short of breath.  She reports sedentary lifestyle.  She states that she has been eating well and is no longer losing weight, however.  In fact, she reports that she has recently been gaining weight  and feels that this is due to food rather than volume.  She reports racing heart rate when her chest pain is more severe.  No palpitations.  She denies presyncope or syncope.  No recent falls.  No reported signs or symptoms of bleeding.  She reports that she has hemodialysis scheduled later today.  She is joined today by her caretaker, he reports that she is still undergoing an alternating hemodialysis schedule with an additional day of hemodialysis every other week.    Heart Pathway Score:     Past Medical History:  Diagnosis Date   (HFpEF) heart failure with preserved ejection fraction ([place])    a. TTE  01/2014: nl LV sys fxn, no valvular abnormalities; b. TTE 11/16: nl EF, mild LVH;  c. 04/2019 Echo: EF 60-65%. DD. Nl RV fxn. Mod AS. 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   Allergy    Anemia of chronic disease    Anxiety    Aortic atherosclerosis (H[facility])    Asthma    Chronic back pain    COPD (chronic obstructive pulmonary disease) ([place])    Diabetes mellitus with complication ([place])    ESRD on hemodialysis ([place])    a. Tues/Sat; b. 2/2 small kidneys   Essential hypertension    Fistula    lower left arm   GERD (gastroesophageal reflux disease)    Gout    History of exercise stress test    a. 01/2014: no evidence of ischemia; b. Lexiscan 08/2015: no sig ischemia, severe GI uptake artifact, low risk; c. CPET @ [facility] 09/2016: exercised 3 min 12 sec on bike without incline, 2.28 METs, VO2 of 8.1, 48% of predicted, indicating mod to sev functional impairment, evidence of blunted HR, stroke volume, and BP augmentation as well as ventilation-perfusion mismatch with exercise   HLD (hyperlipidemia)    Non-obstructive Carotid arterial disease ([place])    a. 12/2017: <50% bilat ICA dzs.   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 by 09/2019 echo b. 04/2019 Echo: Mod AS.   Sleep apnea     Past Surgical History:  Procedure Laterality Date   CARDIAC CATHETERIZATION     carpel tunnel     GALLBLADDER SURGERY     PERIPHERAL VASCULAR CATHETERIZATION N/A 04/12/2015   Procedure: A/V Shuntogram/Fistulagram;  Surgeon: Algernon Huxley, MD;  Location: [place] [facility];  Service: Cardiovascular;  Laterality: N/A;   PERIPHERAL VASCULAR CATHETERIZATION N/A 04/12/2015   Procedure: A/V Shunt Intervention;  Surgeon: Algernon Huxley, MD;  Location: [place] [facility];  Service: Cardiovascular;  Laterality: N/A;   PERIPHERAL VASCULAR CATHETERIZATION N/A 06/09/2015   Procedure: Dialysis/Perma Catheter Removal;  Surgeon: Katha Cabal, MD;   Location: [place] [facility];  Service: Cardiovascular;  Laterality: N/A;   RIGHT/LEFT HEART CATH AND CORONARY ANGIOGRAPHY N/A 10/14/2019   Procedure: RIGHT/LEFT HEART CATH AND CORONARY ANGIOGRAPHY;  Surgeon: Belva Crome, MD;  Location: [place] [facility];  Service: Cardiovascular;  Laterality: N/A;     Home Medications:  Prior to Admission medications   Medication Sig Start Date End Date Taking? Authorizing Provider  amLODipine (NORVASC) 5 MG tablet Take 1-2 tablets (5-10 mg total) by mouth as directed. Take 1 tablet (5 mg) daily and may take extra 1 tablet (5 mg) as needed for pressure greater than 140 06/28/20 09/26/20 Yes Gollan, Kathlene November, MD  aspirin (ASPIRIN 81) 81 MG EC tablet Take 1  tablet by mouth daily.  01/27/20  Yes [provider]  calcium acetate (PHOSLO) 667 MG capsule Take 2 capsules by mouth in the morning, at noon, and at bedtime. 12/31/19  Yes [provider]  carvedilol (COREG) 3.125 MG tablet Take 1 tablet (3.125 mg total) by mouth 2 (two) times daily with a meal. 04/30/20  Yes Dhungel, Nishant, MD  furosemide (LASIX) 80 MG tablet Take 1 tablet (80 mg total) by mouth daily. 04/02/20 04/02/21 Yes Nolberto Hanlon, MD  hydrALAZINE (APRESOLINE) 50 MG tablet TAKE 1 TABLET BY MOUTH EVERY 8 HOURS Patient taking differently: Take 50 mg by mouth 3 (three) times daily.  06/29/20  Yes Gollan, Kathlene November, MD  HYDROcodone-acetaminophen (NORCO) 7.5-325 MG tablet Take 1 tablet by mouth every 6 (six) hours as needed (pain). 10/08/19  Yes Danford, Suann Larry, MD  lidocaine (LIDODERM) 5 % Place 1 patch onto the skin daily. Remove & Discard patch within 12 hours or as directed by MD 10/14/19  Yes Flora Lipps, MD  losartan (COZAAR) 50 MG tablet Take 50 mg by mouth daily. 03/18/19  Yes [provider]  mirtazapine (REMERON) 15 MG tablet Take 15 mg by mouth at bedtime. 02/25/20  Yes [provider]  oxybutynin (DITROPAN XL) 15 MG 24 hr tablet Take 15 mg by mouth daily.  12/24/17  Yes [provider]  pravastatin (PRAVACHOL) 40 MG tablet Take 40 mg by mouth at bedtime.    Yes [provider]  sertraline (ZOLOFT) 50 MG tablet Take 50 mg by mouth at bedtime. 03/02/20  Yes [provider]  albuterol (PROVENTIL HFA;VENTOLIN HFA) 108 (90 Base) MCG/ACT inhaler Inhale 2 puffs into the lungs every 6 (six) hours as needed for wheezing or shortness of breath. Out of medication    [provider]  epoetin alfa (EPOGEN) 10000 UNIT/ML injection Inject 0.4 mLs (4,000 Units total) into the vein Every Tuesday,Thursday,and Saturday with dialysis. 04/03/20   Nolberto Hanlon, MD  ipratropium-albuterol (DUONEB) 0.5-2.5 (3) MG/3ML SOLN Take 3 mLs by nebulization every 6 (six) hours as needed. 10/17/19   [facility], Tami Lin, PA  lidocaine-prilocaine (EMLA) cream Apply 1 application topically as needed (prior to treatment).     [provider]    Inpatient Medications: Scheduled Meds:  amLODipine  5 mg Oral Daily   aspirin EC  81 mg Oral Daily   azithromycin  250 mg Oral Daily   calcium acetate  1,334 mg Oral TID WC   carvedilol  3.125 mg Oral BID WC   furosemide  80 mg Oral Daily   hydrALAZINE  50 mg Oral TID   ipratropium-albuterol  3 mL Nebulization Q4H WA   lidocaine  1 patch Transdermal Q24H   mirtazapine  15 mg Oral QHS   oxybutynin  15 mg Oral Daily   pravastatin  40 mg Oral QHS   predniSONE  40 mg Oral Q breakfast   sertraline  50 mg Oral QHS   sodium chloride flush  3 mL Intravenous Q12H   Continuous Infusions:  sodium chloride     heparin 550 Units/hr (08/30/20 0631)   PRN Meds: sodium chloride, acetaminophen, albuterol, dextromethorphan-guaiFENesin, hydrALAZINE, HYDROcodone-acetaminophen, hydrOXYzine, lidocaine-prilocaine, ondansetron (ZOFRAN) IV, sodium chloride flush  Allergies:    Allergies  Allergen Reactions   Enalapril Maleate Other (See Comments)    Other reaction(s): Headache   Nitrofurantoin  Swelling and Rash    Other Reaction: swelling of body   Sulfamethoxazole-Trimethoprim Swelling   2,4-D Dimethylamine (Amisol) Rash and Other (See  Comments)    Other Reaction: h/a   Baclofen Other (See Comments) and Nausea Only    lightheadness ,drowsiness , muscle weakness , twitching in hands    Neosporin [Neomycin-Bacitracin Zn-Polymyx] Other (See Comments) and Rash    Other Reaction: irritation Skin irritation    Quinine Nausea And Vomiting, Rash and Other (See Comments)    Other Reaction: Vomiting, rash, h/a, vision   Ultram [Tramadol] Palpitations   Zocor [Simvastatin] Other (See Comments) and Rash    Other Reaction: muscle spasms Muscle pain and spasms   Bactrim [Sulfamethoxazole-Trimethoprim] Swelling   Levodopa Other (See Comments)    Reaction: unknown   Macrodantin [Nitrofurantoin Macrocrystal] Swelling   Quinine Derivatives Other (See Comments)    Vertigo,nausea vomiting blurred vision headache ears sensitivity     Social History:   Social History   Socioeconomic History   Marital status: Single    Spouse name: Not on file   Number of children: Not on file   Years of education: Not on file   Highest education level: Not on file  Occupational History   Not on file  Tobacco Use   Smoking status: Never Smoker   Smokeless tobacco: Never Used  Vaping Use   Vaping Use: Never used  Substance and Sexual Activity   Alcohol use: No   Drug use: No   Sexual activity: Not Currently  Other Topics Concern   Not on file  Social History Narrative   Not on file   Social Determinants of Health   Financial Resource Strain:    Difficulty of Paying Living Expenses: Not on file  Food Insecurity:    Worried About Charity fundraiser in the Last Year: Not on file   [employer] of Food in the Last Year: Not on file  Transportation Needs:    Lack of Transportation (Medical): Not on file   Lack of Transportation (Non-Medical): Not on file  Physical  Activity:    Days of Exercise per Week: Not on file   Minutes of Exercise per Session: Not on file  Stress:    Feeling of Stress : Not on file  Social Connections:    Frequency of Communication with Friends and Family: Not on file   Frequency of Social Gatherings with Friends and Family: Not on file   Attends Religious Services: Not on file   Active Member of Clubs or Organizations: Not on file   Attends Archivist Meetings: Not on file   Marital Status: Not on file  Intimate Partner Violence:    Fear of Current or Ex-Partner: Not on file   Emotionally Abused: Not on file   Physically Abused: Not on file   Sexually Abused: Not on file    Family History:    Family History  Problem Relation Age of Onset   Hypertension Mother    Hyperlipidemia Mother    Heart disease Father    Heart attack Father 34   Hypertension Father    Hyperlipidemia Father    Heart disease Brother        CABG    Heart attack Brother    Breast cancer Neg Hx      ROS:  Please see the history of present illness.  Review of Systems  Constitutional: Positive for malaise/fatigue. Negative for diaphoresis.  Respiratory: Positive for cough and shortness of breath.   Cardiovascular: Positive for chest pain and orthopnea. Negative for palpitations, claudication, leg swelling and PND.  Gastrointestinal: Positive for nausea and  vomiting. Negative for blood in stool and melena.  Genitourinary: Negative for hematuria.  Musculoskeletal: Negative for falls.  Neurological: Negative for dizziness, focal weakness and loss of consciousness.  All other systems reviewed and are negative.   All other ROS reviewed and negative.     Physical Exam/Data:   Vitals:   08/30/20 0151 08/30/20 0202 08/30/20 0402 08/30/20 0602  BP: (!) 118/47 (!) 117/51 (!) 109/47 (!) 102/40  Pulse: 77 80 75 77  Resp:   20   Temp: 97.8 F (36.6 C) 97.8 F (36.6 C) 98.6 F (37 C)   TempSrc: Oral Oral Oral     SpO2: 100% 100% 100% 100%  Weight:   61.6 kg   Height:        Intake/Output Summary (Last 24 hours) at 08/30/2020 0731 Last data filed at 08/30/2020 0300 Gross per 24 hour  Intake 76.91 ml  Output 0 ml  Net 76.91 ml   Last 3 Weights 08/30/2020 08/29/2020 08/28/2020  Weight (lbs) 135 lb 12.2 oz 136 lb 1.4 oz 135 lb 12.8 oz  Weight (kg) 61.58 kg 61.73 kg 61.598 kg     Body mass index is 28.37 kg/m.  General: Obese female, no acute distress, lying in bed. HEENT: normal, nasal cannula in place on 4 L nasal cannula oxygen Neck: JVD difficult to assess due to body habitus and position of patient. Vascular: No carotid bruits; radial pulses 2+ bilaterally Cardiac:  normal S1, S2; regular and tachycardic; severe 3/6 systolic ejection aortic stenosis murmur appreciated and best heard along the RUSB Lungs: Bilateral expiratory wheeze, bilateral bibasilar crackles Abd: Somewhat distended, nontender, no hepatomegaly  Ext: no edema Musculoskeletal:  No deformities, BUE and BLE strength normal and equal Skin: warm and dry  Neuro:  No focal abnormalities noted Psych:  Normal affect   EKG:  The EKG was personally reviewed and demonstrates:  EKG showed NSR, 75 bpm, QRS 92 ms, QTC 472 ms, no acute ST or T changes.    Telemetry:  Telemetry was personally reviewed and demonstrates: NSR, ST, rates 70s to 110s  Relevant CV Studies: Echo 04/2020 1. Left ventricular ejection fraction, by estimation, is 60 to 65%. The  left ventricle has normal function. The left ventricle has no regional  wall motion abnormalities. Left ventricular diastolic parameters are  consistent with Grade I diastolic  dysfunction (impaired relaxation). Elevated left atrial pressure.  2. Right ventricular systolic function is normal. The right ventricular  size is normal. Tricuspid regurgitation signal is inadequate for assessing  PA pressure.  3. Left atrial size was mildly dilated.  4. Moderately elevated  transmitral gradient is noted, most likely due to  bulky annular calcification. The mitral valve was not well visualized.  Trivial mitral valve regurgitation.  5. The aortic valve has an indeterminant number of cusps. Aortic valve  regurgitation is trivial. Moderate to severe aortic valve stenosis. Aortic  valve mean gradient measures 23.0 mmHg. Aortic valve Vmax measures 3.33  m/s.    09/2019 R/LHCath  Severe, calcified ostial to distal left main, 80%.  Severe ostial LAD, 99% followed by an eccentric 50 to 70% proximal LAD after the first diagonal.  Ostial to proximal 60% circumflex.  The second obtuse marginal contains segmental 60 to 70% narrowing.  The circumflex is dominant.  Nondominant RCA.  Moderately severe to severe aortic stenosis with valve area of 0.99 cm, peak to peak gradient 20 mmHg, mean gradient 28 mmHg, based upon cardiac output of 7.2 L/min and corresponding cardiac  index of 4.83 L/min/m  Severe ascending and descending aortic calcification RECOMMENDATIONS  Heart team approach: Determine if patient has surgical options with SAVR and CABG.  Consider extremely high risk PCI which would require hemodynamic support.  Consider palliative care given end-stage kidney disease on dialysis, severe anemia, and poor long-term prognosis even if stent therapy is successful.  Discussed with Dr. Burt Knack.   Laboratory Data:  High Sensitivity Troponin:   Recent Labs  Lab 08/28/20 0958 08/29/20 1842 08/29/20 2214 08/30/20 0200 08/30/20 0441  TROPONINIHS 32* 266* 461* 706* 1,148*     Cardiac EnzymesNo results for input(s): TROPONINI in the last 168 hours. No results for input(s): TROPIPOC in the last 168 hours.  Chemistry Recent Labs  Lab 08/28/20 0958 08/29/20 0440 08/30/20 0441  NA 130* 136 137  K 4.8 5.2* 5.2*  CL 90* 93* 92*  CO2 25 27 29   GLUCOSE 182* 158* 114*  BUN 66* 36* 62*  CREATININE 7.00* 4.86* 6.49*  CALCIUM 8.5* 8.6* 9.1  GFRNONAA 5* 8* 6*   ANIONGAP 15 16* 16*    Recent Labs  Lab 08/28/20 0958  PROT 7.0  ALBUMIN 4.1  AST 17  ALT 12  ALKPHOS 118  BILITOT 0.8   Hematology Recent Labs  Lab 08/28/20 0958 08/29/20 0440 08/30/20 0441  WBC 12.2* 7.2 13.5*  RBC 3.45* 2.84* 2.79*  HGB 10.1* 8.3* 8.3*  HCT 30.7* 25.1* 25.7*  MCV 89.0 88.4 92.1  MCH 29.3 29.2 29.7  MCHC 32.9 33.1 32.3  RDW 15.9* 15.9* 16.3*  PLT 208 140* 169   BNP Recent Labs  Lab 08/28/20 0959 08/29/20 0440  BNP 697.1* 948.0*    DDimer No results for input(s): DDIMER in the last 168 hours.   Radiology/Studies:  DG Chest Port 1 View  Result Date: 08/28/2020 CLINICAL DATA:  Sepsis.  Shortness of breath. EXAM: PORTABLE CHEST 1 VIEW COMPARISON:  08/05/2020 FINDINGS: Patient rotated right. Midline trachea. Mild cardiomegaly. Atherosclerosis in the transverse aorta. No pleural effusion or pneumothorax. Pulmonary interstitial prominence is slightly increased. Subtle right lower lobe opacity is again suspected. IMPRESSION: Interstitial thickening, felt to be slightly increased. Primarily the sequelae of smoking/chronic bronchitis. Developing mild pulmonary venous congestion cannot be excluded. Lateral right lower lobe opacity, which could represent residual or recurrent pneumonia. Aortic Atherosclerosis (ICD10-I70.0). Electronically Signed   By: Abigail Miyamoto M.D.   On: 08/28/2020 10:40    Assessment and Plan:   Non-STEMI  History of CAD  --Current chest pain with plan for hemodialysis later today.  Re-ordered SL nitro, She has a history of chronic chest pain with history of costochondritis, as well as chest pain in the setting of volume overload prior to hemodialysis.  In addition, she has severe aortic stenosis, which likely contributes to her discomfort.  09/2019 LHC revealed severe left main and ostial LAD disease.  04/2020 echo as above with normal EF and severe aortic stenosis.  EKG without acute ST/T changes.  High-sensitivity troponin 1,148 and not  yet downtrending.  She has been considered extremely high risk for PCI and deemed not a candidate for CABG/TAVR. Overall prognosis considered poor. Consider discussion to determine long term goals of care. No plan for further ischemic work-up at this time.  Plan for HD later today for volume management. Continue medical management.  Severe aortic stenosis --04/2020 echo as above.  Unfortunately, she is not a candidate for sternotomy/CABG or high risk PCI.  Not a candidate for TAVR.  Overall prognosis has been deemed poor.  Consider as likely contributing to her volume status.  Continue hemodialysis for volume management.  ESRD on HD --As noted in her most recent office visit, hemodialysis relieves her symptoms of chest pain and shortness of breath.  She is scheduled later for hemodialysis.  Anemia of chronic disease --Daily CBC.  Continue to monitor.   For questions or updates, please contact [place] Please consult [email or web address] for contact info under     Signed, Arvil Chaco, PA-C  08/30/2020 7:31 AM

## 2020-08-30 NOTE — Progress Notes (Signed)
ANTICOAGULATION CONSULT NOTE - Initial Consult  Pharmacy Consult for Heparin Drip Indication: chest pain/ACS  Allergies  Allergen Reactions  . Enalapril Maleate Other (See Comments)    Other reaction(s): Headache  . Nitrofurantoin Swelling and Rash    Other Reaction: swelling of body  . Sulfamethoxazole-Trimethoprim Swelling  . 2,4-D Dimethylamine (Amisol) Rash and Other (See Comments)    Other Reaction: h/a  . Baclofen Other (See Comments) and Nausea Only    lightheadness ,drowsiness , muscle weakness , twitching in hands   . Neosporin [Neomycin-Bacitracin Zn-Polymyx] Other (See Comments) and Rash    Other Reaction: irritation Skin irritation   . Quinine Nausea And Vomiting, Rash and Other (See Comments)    Other Reaction: Vomiting, rash, h/a, vision  . Ultram [Tramadol] Palpitations  . Zocor [Simvastatin] Other (See Comments) and Rash    Other Reaction: muscle spasms Muscle pain and spasms  . Bactrim [Sulfamethoxazole-Trimethoprim] Swelling  . Levodopa Other (See Comments)    Reaction: unknown  . Macrodantin [Nitrofurantoin Macrocrystal] Swelling  . Quinine Derivatives Other (See Comments)    Vertigo,nausea vomiting blurred vision headache ears sensitivity     Patient Measurements: Height: 4\' 10"  (147.3 cm) Weight: 61.6 kg (135 lb 12.2 oz) IBW/kg (Calculated) : 40.9 Heparin Dosing Weight: 54.3 kg  Vital Signs: Temp: 98.6 F (37 C) (10/11 0402) Temp Source: Oral (10/11 0402) BP: 102/40 (10/11 0602) Pulse Rate: 77 (10/11 0602)  Labs: Recent Labs    08/28/20 0958 08/28/20 0958 08/29/20 0440 08/29/20 1842 08/29/20 2214 08/30/20 0200 08/30/20 0441  HGB 10.1*   < > 8.3*  --   --   --  8.3*  HCT 30.7*  --  25.1*  --   --   --  25.7*  PLT 208  --  140*  --   --   --  169  APTT 28  --   --   --   --   --   --   LABPROT 13.0  --   --   --   --   --   --   INR 1.0  --   --   --   --   --   --   HEPARINUNFRC  --   --   --   --   --   --  0.95*  CREATININE 7.00*   --  4.86*  --   --   --  6.49*  TROPONINIHS 32*  --   --    < > 461* 706* 1,148*   < > = values in this interval not displayed.    Estimated Creatinine Clearance: 5.9 mL/min (A) (by C-G formula based on SCr of 6.49 mg/dL (H)).   Medical History: Past Medical History:  Diagnosis Date  . (HFpEF) heart failure with preserved ejection fraction ([place])    a. TTE 01/2014: nl LV sys fxn, no valvular abnormalities; b. TTE 11/16: nl EF, mild LVH;  c. 04/2019 Echo: EF 60-65%. DD. Nl RV fxn. Mod AS. 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  . Allergy   . Anemia of chronic disease   . Anxiety   . Aortic atherosclerosis ([place])   . Asthma   . Chronic back pain   . COPD (chronic obstructive pulmonary disease) ([place])   . Diabetes mellitus with complication ([place])   . ESRD on hemodialysis ([place])    a. Tues/Sat; b. 2/2 small kidneys  . Essential hypertension   . Fistula    lower left  arm  . GERD (gastroesophageal reflux disease)   . Gout   . History of exercise stress test    a. 01/2014: no evidence of ischemia; b. Lexiscan 08/2015: no sig ischemia, severe GI uptake artifact, low risk; c. CPET @ [facility] 09/2016: exercised 3 min 12 sec on bike without incline, 2.28 METs, VO2 of 8.1, 48% of predicted, indicating mod to sev functional impairment, evidence of blunted HR, stroke volume, and BP augmentation as well as ventilation-perfusion mismatch with exercise  . HLD (hyperlipidemia)   . Non-obstructive Carotid arterial disease ([place])    a. 12/2017: <50% bilat ICA dzs.  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 Severe aortic stenosis    a. by 09/2019 echo b. 04/2019 Echo: Mod AS.  Marland Kitchen Sleep apnea    Assessment: Patient is a 74yo female presented with chest pain. Troponin 32 > 266. Pharmacy consulted for Heparin dosing.  Goal of Therapy:  Heparin level 0.3-0.7 units/ml Monitor platelets by anticoagulation protocol: Yes   Plan:  Give 3200 units bolus x 1 Start heparin infusion  at 650 units/hr Check anti-Xa level in 8 hours and daily while on heparin Continue to monitor H&H and platelets   10/11:  HL @ 0441 = 0.95 Will decrease heparin drip rate to 550 units/hr and recheck HL 8 hrs after rate change.   Garyn Arlotta D 08/30/2020 6:43 AM

## 2020-08-31 DIAGNOSIS — I248 Other forms of acute ischemic heart disease: Secondary | ICD-10-CM | POA: Diagnosis not present

## 2020-08-31 DIAGNOSIS — J9621 Acute and chronic respiratory failure with hypoxia: Secondary | ICD-10-CM | POA: Diagnosis not present

## 2020-08-31 DIAGNOSIS — N186 End stage renal disease: Secondary | ICD-10-CM | POA: Diagnosis not present

## 2020-08-31 DIAGNOSIS — D631 Anemia in chronic kidney disease: Secondary | ICD-10-CM

## 2020-08-31 DIAGNOSIS — I350 Nonrheumatic aortic (valve) stenosis: Secondary | ICD-10-CM | POA: Diagnosis not present

## 2020-08-31 LAB — BLOOD GAS, ARTERIAL
Acid-Base Excess: 8.9 mmol/L — ABNORMAL HIGH (ref 0.0–2.0)
Bicarbonate: 32.7 mmol/L — ABNORMAL HIGH (ref 20.0–28.0)
Delivery systems: POSITIVE
Expiratory PAP: 6
FIO2: 0.28
Inspiratory PAP: 14
Mechanical Rate: 12
O2 Saturation: 97.2 %
Patient temperature: 37
RATE: 12 resp/min
pCO2 arterial: 41 mmHg (ref 32.0–48.0)
pH, Arterial: 7.51 — ABNORMAL HIGH (ref 7.350–7.450)
pO2, Arterial: 84 mmHg (ref 83.0–108.0)

## 2020-08-31 LAB — CBC
HCT: 23.5 % — ABNORMAL LOW (ref 36.0–46.0)
Hemoglobin: 7.8 g/dL — ABNORMAL LOW (ref 12.0–15.0)
MCH: 29.7 pg (ref 26.0–34.0)
MCHC: 33.2 g/dL (ref 30.0–36.0)
MCV: 89.4 fL (ref 80.0–100.0)
Platelets: 175 10*3/uL (ref 150–400)
RBC: 2.63 MIL/uL — ABNORMAL LOW (ref 3.87–5.11)
RDW: 16.4 % — ABNORMAL HIGH (ref 11.5–15.5)
WBC: 14.2 10*3/uL — ABNORMAL HIGH (ref 4.0–10.5)
nRBC: 0 % (ref 0.0–0.2)

## 2020-08-31 LAB — TROPONIN I (HIGH SENSITIVITY)
Troponin I (High Sensitivity): 1186 ng/L (ref <18)
Troponin I (High Sensitivity): 7200 ng/L (ref <18)

## 2020-08-31 LAB — BASIC METABOLIC PANEL
Anion gap: 11 (ref 5–15)
BUN: 30 mg/dL — ABNORMAL HIGH (ref 8–23)
CO2: 31 mmol/L (ref 22–32)
Calcium: 9 mg/dL (ref 8.9–10.3)
Chloride: 97 mmol/L — ABNORMAL LOW (ref 98–111)
Creatinine, Ser: 3.92 mg/dL — ABNORMAL HIGH (ref 0.44–1.00)
GFR, Estimated: 11 mL/min — ABNORMAL LOW (ref >60)
Glucose, Bld: 125 mg/dL — ABNORMAL HIGH (ref 70–99)
Potassium: 4.9 mmol/L (ref 3.5–5.1)
Sodium: 139 mmol/L (ref 135–145)

## 2020-08-31 LAB — PREPARE RBC (CROSSMATCH)

## 2020-08-31 LAB — HEPARIN LEVEL (UNFRACTIONATED): Heparin Unfractionated: 0.33 IU/mL (ref 0.30–0.70)

## 2020-08-31 LAB — MAGNESIUM: Magnesium: 2.1 mg/dL (ref 1.7–2.4)

## 2020-08-31 MED ORDER — FUROSEMIDE 10 MG/ML IJ SOLN
40.0000 mg | Freq: Once | INTRAMUSCULAR | Status: AC
  Administered 2020-08-31: 40 mg via INTRAVENOUS
  Filled 2020-08-31: qty 4

## 2020-08-31 MED ORDER — FUROSEMIDE 10 MG/ML IJ SOLN
40.0000 mg | Freq: Once | INTRAMUSCULAR | Status: DC
  Filled 2020-08-31: qty 4

## 2020-08-31 MED ORDER — SODIUM CHLORIDE 0.9% IV SOLUTION: Freq: Once | INTRAVENOUS | Status: AC

## 2020-08-31 NOTE — Progress Notes (Signed)
1600 order for 2nd Iv start per IV team.

## 2020-08-31 NOTE — Progress Notes (Signed)
Pt transferred to ICU 18 on the Bipap without incident. Pt remains on the Bipap and is tol well at this time.

## 2020-08-31 NOTE — Progress Notes (Signed)
ANTICOAGULATION CONSULT NOTE -  Pharmacy Consult for Heparin Drip Indication: chest pain/ACS  Allergies  Allergen Reactions  . Enalapril Maleate Other (See Comments)    Other reaction(s): Headache  . Nitrofurantoin Swelling and Rash    Other Reaction: swelling of body  . Sulfamethoxazole-Trimethoprim Swelling  . 2,4-D Dimethylamine (Amisol) Rash and Other (See Comments)    Other Reaction: h/a  . Baclofen Other (See Comments) and Nausea Only    lightheadness ,drowsiness , muscle weakness , twitching in hands   . Neosporin [Neomycin-Bacitracin Zn-Polymyx] Other (See Comments) and Rash    Other Reaction: irritation Skin irritation   . Quinine Nausea And Vomiting, Rash and Other (See Comments)    Other Reaction: Vomiting, rash, h/a, vision  . Ultram [Tramadol] Palpitations  . Zocor [Simvastatin] Other (See Comments) and Rash    Other Reaction: muscle spasms Muscle pain and spasms  . Bactrim [Sulfamethoxazole-Trimethoprim] Swelling  . Levodopa Other (See Comments)    Reaction: unknown  . Macrodantin [Nitrofurantoin Macrocrystal] Swelling  . Quinine Derivatives Other (See Comments)    Vertigo,nausea vomiting blurred vision headache ears sensitivity     Patient Measurements: Height: 4\' 10"  (147.3 cm) Weight: 64.2 kg (141 lb 8.6 oz) IBW/kg (Calculated) : 40.9 Heparin Dosing Weight: 54.3 kg  Vital Signs: Temp: 97.9 F (36.6 C) (10/12 0059) Temp Source: Oral (10/12 0059) BP: 110/53 (10/12 0600) Pulse Rate: 108 (10/12 0600)  Labs: Recent Labs    08/28/20 0958 08/28/20 0958 08/29/20 0440 08/29/20 1842 08/30/20 0441 08/30/20 0441 08/30/20 1003 08/30/20 1430 08/30/20 2216 08/31/20 0119 08/31/20 0451  HGB 10.1*   < > 8.3*  --  8.3*   < > 8.2*  --   --   --  7.8*  HCT 30.7*   < > 25.1*  --  25.7*  --  25.1*  --   --   --  23.5*  PLT 208   < > 140*  --  169  --  190  --   --   --  175  APTT 28  --   --   --   --   --   --   --   --   --   --   LABPROT 13.0  --   --    --   --   --   --   --   --   --   --   INR 1.0  --   --   --   --   --   --   --   --   --   --   HEPARINUNFRC  --   --   --   --  0.95*   < >  --  0.58 0.48  --  0.33  CREATININE 7.00*   < > 4.86*  --  6.49*  --   --   --   --   --  3.92*  TROPONINIHS 32*  --   --    < > 1,148*  --   --   --  1,497* 1,186*  --    < > = values in this interval not displayed.    Estimated Creatinine Clearance: 10 mL/min (A) (by C-G formula based on SCr of 3.92 mg/dL (H)).   Medical History: Past Medical History:  Diagnosis Date  . (HFpEF) heart failure with preserved ejection fraction ([place])    a. TTE 01/2014: nl LV sys fxn, no valvular abnormalities; b. TTE 11/16: nl  EF, mild LVH;  c. 04/2019 Echo: EF 60-65%. DD. Nl RV fxn. Mod AS. 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  . Allergy   . Anemia of chronic disease   . Anxiety   . Aortic atherosclerosis ([place])   . Asthma   . Chronic back pain   . COPD (chronic obstructive pulmonary disease) ([place])   . Diabetes mellitus with complication ([place])   . ESRD on hemodialysis ([place])    a. Tues/Sat; b. 2/2 small kidneys  . Essential hypertension   . Fistula    lower left arm  . GERD (gastroesophageal reflux disease)   . Gout   . History of exercise stress test    a. 01/2014: no evidence of ischemia; b. Lexiscan 08/2015: no sig ischemia, severe GI uptake artifact, low risk; c. CPET @ [facility] 09/2016: exercised 3 min 12 sec on bike without incline, 2.28 METs, VO2 of 8.1, 48% of predicted, indicating mod to sev functional impairment, evidence of blunted HR, stroke volume, and BP augmentation as well as ventilation-perfusion mismatch with exercise  . HLD (hyperlipidemia)   . Non-obstructive Carotid arterial disease ([place])    a. 12/2017: <50% bilat ICA dzs.  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 Severe aortic stenosis    a. by 09/2019 echo b. 04/2019 Echo: Mod AS.  Marland Kitchen Sleep apnea    Assessment: Patient is a 74yo female presented with  chest pain. PMH chronic chest pain with history of costochondritis, sev aortic stenosis, ESRD on HD (TTS), recurrent flash pulmonary edema, chronic anemia (epogen 10,000 units with dialysis), HFpEF, COPD, and asthma. 09/2019 LHC revealed severe left main and ostial LAD disease. She had been considered extremely high risk for PCI and deemed not a candidate for CABG/TAVR. Pharmacy consulted for Heparin dosing and monitoring.   Troponin 32>266>461>706>1148 Baseline aPTT 13, INR 1, Hgb 10.1, Plt 208 Hgb and Plt trending down - monitor  10/11 HL @0441  0.95; supratherapeutic, rate decreased to 550units/hr 10/11 HL @1430  0.58; therapeutic x1 10/11 HL @ 2215 0.48, therapeutic x 2 10/12 HL @ 0451 0.33, therapeutic x 3  Goal of Therapy:  Heparin level 0.3-0.7 units/ml Monitor platelets by anticoagulation protocol: Yes   Plan:  HL therapeutic, will continue current rate at 550 units/hr Check HL daily w/ CBC Continue to monitor H&H and platelets    Ena Dawley, PharmD 08/31/2020 6:59 AM

## 2020-08-31 NOTE — Progress Notes (Signed)
[facility]  ROUNDING NOTE   Subjective:   Hemodialysis treatment yesterday. Tolerated treatment well. UF of 1 liter.  Last night, went into respiratory distressed. Placed on BIPAP and moved to ICU.   Seen and examined on hemodialysis.     HEMODIALYSIS FLOWSHEET:  Blood Flow Rate (mL/min): 200 mL/min Arterial Pressure (mmHg): -230 mmHg Venous Pressure (mmHg): 130 mmHg Transmembrane Pressure (mmHg): 80 mmHg Ultrafiltration Rate (mL/min): 0 mL/min Dialysate Flow Rate (mL/min): 400 ml/min Conductivity: Machine : 14 Conductivity: Machine : 14 Dialysis Fluid Bolus: Normal Saline Bolus Amount (mL): 250 mL      Objective:  Vital signs in last 24 hours:  Temp:  [97.4 F (36.3 C)-99 F (37.2 C)] 98.9 F (37.2 C) (10/12 0800) Pulse Rate:  [76-136] 100 (10/12 0913) Resp:  [16-52] 21 (10/12 0900) BP: (90-141)/(44-94) 90/61 (10/12 0913) SpO2:  [88 %-100 %] 100 % (10/12 0900) Weight:  [64.2 kg] 64.2 kg (10/12 0500)  Weight change: 2.62 kg Filed Weights   08/29/20 0528 08/30/20 0402 08/31/20 0500  Weight: 61.7 kg 61.6 kg 64.2 kg    Intake/Output: I/O last 3 completed shifts: In: 76.9 [I.V.:76.9] Out: 906 [Urine:15; Other:891]   Intake/Output this shift:  No intake/output data recorded.  Physical Exam: General: Seated in chair  Head: Normocephalic, atraumatic. Moist oral mucosal membranes  Eyes: Anicteric  Neck: Supple, trachea midline  Lungs:  +wheezing bilaterally. On [place] O2  Heart: Regular rate and rhythm, +murmur  Abdomen:  Soft, nontender,   Extremities: No peripheral edema.  Neurologic: Alert,oriented,moving all four extremities  Skin: No acute  Lesions or rashes  Access: Left AVF +thrill,+ bruit    Basic Metabolic Panel: Recent Labs  Lab 08/28/20 0958 08/28/20 0958 08/29/20 0440 08/30/20 0441 08/31/20 0451  NA 130*  --  136 137 139  K 4.8  --  5.2* 5.2* 4.9  CL 90*  --  93* 92* 97*  CO2 25  --  27 29 31   GLUCOSE 182*  --  158* 114*  125*  BUN 66*  --  36* 62* 30*  CREATININE 7.00*  --  4.86* 6.49* 3.92*  CALCIUM 8.5*   < > 8.6* 9.1 9.0  MG  --   --   --  2.8* 2.1   < > = values in this interval not displayed.    Liver Function Tests: Recent Labs  Lab 08/28/20 0958  AST 17  ALT 12  ALKPHOS 118  BILITOT 0.8  PROT 7.0  ALBUMIN 4.1   No results for input(s): LIPASE, AMYLASE in the last 168 hours. No results for input(s): AMMONIA in the last 168 hours.  CBC: Recent Labs  Lab 08/28/20 0958 08/29/20 0440 08/30/20 0441 08/30/20 1003 08/31/20 0451  WBC 12.2* 7.2 13.5* 16.1* 14.2*  NEUTROABS 9.3*  --   --   --   --   HGB 10.1* 8.3* 8.3* 8.2* 7.8*  HCT 30.7* 25.1* 25.7* 25.1* 23.5*  MCV 89.0 88.4 92.1 90.9 89.4  PLT 208 140* 169 190 175    Cardiac Enzymes: No results for input(s): CKTOTAL, CKMB, CKMBINDEX, TROPONINI in the last 168 hours.  BNP: Invalid input(s): POCBNP  CBG: Recent Labs  Lab 08/28/20 1953 08/30/20 2129  GLUCAP 205* 199*    Microbiology: Results for orders placed or performed during the hospital encounter of 08/28/20  Blood culture (routine single)     Status: None (Preliminary result)   Collection Time: 08/28/20  9:59 AM   Specimen: BLOOD  Result Value Ref  Range Status   Specimen Description BLOOD RAC  Final   Special Requests   Final    BOTTLES DRAWN AEROBIC AND ANAEROBIC Blood Culture adequate volume   Culture   Final    NO GROWTH 3 DAYS Performed at [facility], [address] [place], [place] [address]    Report Status PENDING  Incomplete  Respiratory Panel by RT PCR (Flu A&B, Covid) - Nasopharyngeal Swab     Status: None   Collection Time: 08/28/20  9:59 AM   Specimen: Nasopharyngeal Swab  Result Value Ref Range Status   SARS Coronavirus 2 by RT PCR NEGATIVE NEGATIVE Final    Comment: (NOTE) SARS-CoV-2 target nucleic acids are NOT DETECTED.  The SARS-CoV-2 RNA is generally detectable in upper respiratoy specimens during the acute phase of  infection. The lowest concentration of SARS-CoV-2 viral copies this assay can detect is 131 copies/mL. A negative result does not preclude SARS-Cov-2 infection and should not be used as the sole basis for treatment or other patient management decisions. A negative result may occur with  improper specimen collection/handling, submission of specimen other than nasopharyngeal swab, presence of viral mutation(s) within the areas targeted by this assay, and inadequate number of viral copies (<131 copies/mL). A negative result must be combined with clinical observations, patient history, and epidemiological information. The expected result is Negative.  Fact Sheet for Patients:  PinkCheek.be  Fact Sheet for Healthcare Providers:  GravelBags.it  This test is no t yet approved or cleared by the [place] FDA and  has been authorized for detection and/or diagnosis of SARS-CoV-2 by FDA under an Emergency Use Authorization (EUA). This EUA will remain  in effect (meaning this test can be used) for the duration of the COVID-19 declaration under Section 564(b)(1) of the Act, 21 U.S.C. section 360bbb-3(b)(1), unless the authorization is terminated or revoked sooner.     Influenza A by PCR NEGATIVE NEGATIVE Final   Influenza B by PCR NEGATIVE NEGATIVE Final    Comment: (NOTE) The Xpert Xpress SARS-CoV-2/FLU/RSV assay is intended as an aid in  the diagnosis of influenza from Nasopharyngeal swab specimens and  should not be used as a sole basis for treatment. Nasal washings and  aspirates are unacceptable for Xpert Xpress SARS-CoV-2/FLU/RSV  testing.  Fact Sheet for Patients: PinkCheek.be  Fact Sheet for Healthcare Providers: GravelBags.it  This test is not yet approved or cleared by the [place] FDA and  has been authorized for detection and/or diagnosis of SARS-CoV-2  by  FDA under an Emergency Use Authorization (EUA). This EUA will remain  in effect (meaning this test can be used) for the duration of the  Covid-19 declaration under Section 564(b)(1) of the Act, 21  U.S.C. section 360bbb-3(b)(1), unless the authorization is  terminated or revoked. Performed at [facility], [address] [place], [place] [address]     Coagulation Studies: No results for input(s): LABPROT, INR in the last 72 hours.  Urinalysis: No results for input(s): COLORURINE, LABSPEC, PHURINE, GLUCOSEU, HGBUR, BILIRUBINUR, KETONESUR, PROTEINUR, UROBILINOGEN, NITRITE, LEUKOCYTESUR in the last 72 hours.  Invalid input(s): APPERANCEUR    Imaging: DG Chest 1 View  Result Date: 08/30/2020 CLINICAL DATA:  Shortness of breath EXAM: CHEST  1 VIEW COMPARISON:  08/28/2020 FINDINGS: Cardiac shadow is stable. Aortic calcifications are again seen. Lungs are well aerated bilaterally. Central vascular congestion is noted with increasing interstitial change consistent with CHF. No sizable effusion is noted. No focal confluent infiltrate is seen. No bony abnormality  is noted. IMPRESSION: Changes consistent with CHF. Electronically Signed   By: Inez Catalina M.D.   On: 08/30/2020 21:48     Medications:    sodium chloride     heparin 550 Units/hr (08/31/20 0807)    amLODipine  5 mg Oral Daily   aspirin EC  81 mg Oral Daily   azithromycin  250 mg Oral Daily   calcium acetate  1,334 mg Oral TID WC   carvedilol  3.125 mg Oral BID WC   Chlorhexidine Gluconate Cloth  6 each Topical Daily   epoetin (EPOGEN/PROCRIT) injection  10,000 Units Intravenous Q T,Th,Sa-HD   furosemide  80 mg Oral Daily   ipratropium-albuterol  3 mL Nebulization Q4H WA   isosorbide mononitrate  30 mg Oral Daily   lidocaine  1 patch Transdermal Q24H   mirtazapine  15 mg Oral QHS   oxybutynin  15 mg Oral Daily   pravastatin  40 mg Oral QHS   predniSONE  40 mg Oral Q breakfast    sertraline  50 mg Oral QHS   sodium chloride flush  3 mL Intravenous Q12H   sodium chloride, acetaminophen, albuterol, dextromethorphan-guaiFENesin, hydrALAZINE, HYDROcodone-acetaminophen, hydrOXYzine, lidocaine-prilocaine, nitroGLYCERIN, nitroGLYCERIN, ondansetron (ZOFRAN) IV, sodium chloride flush  Assessment/ Plan:  Phyllis [name] is a 74 y.o. white female with end stage renal disease on hemodialysis, hypertension, aortic stenosis, COPD, diabetes mellitus type II, chronic costochrondritis who was admitted to [facility] on 08/28/2020 for Flash pulmonary edema ([place]) [J81.0] Hypoxia [R09.02] Acute on chronic respiratory failure with hypoxia ([place]) [J96.21] Acute respiratory failure with hypoxia ([place]) [J96.01]  [facility] TTS [place]. Left AVF 60kg  1. End Stage Renal Disease with hyperkalemia: emergent hemodialysis treatment on admission.  Seems patient has not been getting to her dry weight as an outpatient. Leaving at [facility].   Will continue TTS schedule   2. Hypertension: - On Amlodipine,Furosemide and Hydralazine  3. Anemia with chronic kidney disease: - Epogen 10,000 units with dialysis TTS  4. Secondary Hyperparathyroidism:  - [place] with meals TID   LOS: 2 Phyllis [name] 10/12/202111:04 AM

## 2020-08-31 NOTE — Progress Notes (Signed)
ANTICOAGULATION CONSULT NOTE -  Pharmacy Consult for Heparin Drip Indication: chest pain/ACS  Allergies  Allergen Reactions   Enalapril Maleate Other (See Comments)    Other reaction(s): Headache   Nitrofurantoin Swelling and Rash    Other Reaction: swelling of body   Sulfamethoxazole-Trimethoprim Swelling   2,4-D Dimethylamine (Amisol) Rash and Other (See Comments)    Other Reaction: h/a   Baclofen Other (See Comments) and Nausea Only    lightheadness ,drowsiness , muscle weakness , twitching in hands    Neosporin [Neomycin-Bacitracin Zn-Polymyx] Other (See Comments) and Rash    Other Reaction: irritation Skin irritation    Quinine Nausea And Vomiting, Rash and Other (See Comments)    Other Reaction: Vomiting, rash, h/a, vision   Ultram [Tramadol] Palpitations   Zocor [Simvastatin] Other (See Comments) and Rash    Other Reaction: muscle spasms Muscle pain and spasms   Bactrim [Sulfamethoxazole-Trimethoprim] Swelling   Levodopa Other (See Comments)    Reaction: unknown   Macrodantin [Nitrofurantoin Macrocrystal] Swelling   Quinine Derivatives Other (See Comments)    Vertigo,nausea vomiting blurred vision headache ears sensitivity     Patient Measurements: Height: 4\' 10"  (147.3 cm) Weight: 61.6 kg (135 lb 12.2 oz) IBW/kg (Calculated) : 40.9 Heparin Dosing Weight: 54.3 kg  Vital Signs: Temp: 98.3 F (36.8 C) (10/11 2145) Temp Source: Oral (10/11 2145) BP: 141/56 (10/11 2157) Pulse Rate: 104 (10/11 2157)  Labs: Recent Labs    08/28/20 0958 08/28/20 0958 08/29/20 0440 08/29/20 0440 08/29/20 1842 08/30/20 0200 08/30/20 0441 08/30/20 1003 08/30/20 1430 08/30/20 2216  HGB 10.1*   < > 8.3*   < >  --   --  8.3* 8.2*  --   --   HCT 30.7*   < > 25.1*  --   --   --  25.7* 25.1*  --   --   PLT 208   < > 140*  --   --   --  169 190  --   --   APTT 28  --   --   --   --   --   --   --   --   --   LABPROT 13.0  --   --   --   --   --   --   --   --   --    INR 1.0  --   --   --   --   --   --   --   --   --   HEPARINUNFRC  --   --   --   --   --   --  0.95*  --  0.58 0.48  CREATININE 7.00*  --  4.86*  --   --   --  6.49*  --   --   --   TROPONINIHS 32*  --   --   --    < > 706* 1,148*  --   --  1,497*   < > = values in this interval not displayed.    Estimated Creatinine Clearance: 5.9 mL/min (A) (by C-G formula based on SCr of 6.49 mg/dL (H)).   Medical History: Past Medical History:  Diagnosis Date   (HFpEF) heart failure with preserved ejection fraction ([place])    a. TTE 01/2014: nl LV sys fxn, no valvular abnormalities; b. TTE 11/16: nl EF, mild LVH;  c. 04/2019 Echo: EF 60-65%. DD. Nl RV fxn. Mod AS. Mild-mod LAE, Sev mitral annular Ca2+  w/o stenosis.   Allergy    Anemia of chronic disease    Anxiety    Aortic atherosclerosis ([facility])    Asthma    Chronic back pain    COPD (chronic obstructive pulmonary disease) ([place])    Diabetes mellitus with complication ([place])    ESRD on hemodialysis ([place])    a. Tues/Sat; b. 2/2 small kidneys   Essential hypertension    Fistula    lower left arm   GERD (gastroesophageal reflux disease)    Gout    History of exercise stress test    a. 01/2014: no evidence of ischemia; b. Lexiscan 08/2015: no sig ischemia, severe GI uptake artifact, low risk; c. CPET @ [facility] 09/2016: exercised 3 min 12 sec on bike without incline, 2.28 METs, VO2 of 8.1, 48% of predicted, indicating mod to sev functional impairment, evidence of blunted HR, stroke volume, and BP augmentation as well as ventilation-perfusion mismatch with exercise   HLD (hyperlipidemia)    Non-obstructive Carotid arterial disease ([place])    a. 12/2017: <50% bilat ICA dzs.   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 by 09/2019 echo b. 04/2019 Echo: Mod AS.   Sleep apnea    Assessment: Patient is a 74yo female presented with chest pain. PMH chronic chest pain with history of  costochondritis, sev aortic stenosis, ESRD on HD (TTS), recurrent flash pulmonary edema, chronic anemia (epogen 10,000 units with dialysis), HFpEF, COPD, and asthma. 09/2019 LHC revealed severe left main and ostial LAD disease. She had been considered extremely high risk for PCI and deemed not a candidate for CABG/TAVR. Pharmacy consulted for Heparin dosing and monitoring.   Troponin 32>266>461>706>1148 Baseline aPTT 13, INR 1, Hgb 10.1, Plt 208 Hgb and Plt trending down - monitor  10/11 HL @0441  0.95; supratherapeutic, rate decreased to 550units/hr 10/11 HL @1430  0.58; therapeutic x1 10/11 HL @ 2215 0.48, therapeutic x 2  Goal of Therapy:  Heparin level 0.3-0.7 units/ml Monitor platelets by anticoagulation protocol: Yes   Plan:  HL therapeutic, will continue current rate at 550 units/hr Re-check anti-Xa level in 8 hours Continue to monitor H&H and platelets    Ena Dawley, PharmD 08/31/2020 12:06 AM

## 2020-08-31 NOTE — Progress Notes (Signed)
Progress Note  [name] Date of Encounter: 08/31/2020  Primary Cardiologist: Ida Rogue, MD  Subjective   Developed chest discomfort last night and noted a delay in receiving assistance/sublingual nitroglycerin. She but then became dyspneic and required BiPAP with subsequent transfer back to ICU. She is currently undergoing dialysis this morning. She notes a mild, low level midsternal chest discomfort that has been present for several hours but is not in any acute distress and currently tolerating dialysis well. She understands that options for management are limited and is willing to speak to palliative care for pain management but is not interested in hospice.  Inpatient Medications    Scheduled Meds: . amLODipine  5 mg Oral Daily  . aspirin EC  81 mg Oral Daily  . azithromycin  250 mg Oral Daily  . calcium acetate  1,334 mg Oral TID WC  . carvedilol  3.125 mg Oral BID WC  . epoetin (EPOGEN/PROCRIT) injection  10,000 Units Intravenous Q T,Th,Sa-HD  . furosemide  80 mg Oral Daily  . ipratropium-albuterol  3 mL Nebulization Q4H WA  . isosorbide mononitrate  30 mg Oral Daily  . lidocaine  1 patch Transdermal Q24H  . mirtazapine  15 mg Oral QHS  . oxybutynin  15 mg Oral Daily  . pravastatin  40 mg Oral QHS  . predniSONE  40 mg Oral Q breakfast  . sertraline  50 mg Oral QHS  . sodium chloride flush  3 mL Intravenous Q12H   Continuous Infusions: . sodium chloride    . heparin 550 Units/hr (08/31/20 0807)   PRN Meds: sodium chloride, acetaminophen, albuterol, dextromethorphan-guaiFENesin, hydrALAZINE, HYDROcodone-acetaminophen, hydrOXYzine, lidocaine-prilocaine, nitroGLYCERIN, ondansetron (ZOFRAN) IV, sodium chloride flush   Vital Signs    Vitals:   08/31/20 1100 08/31/20 1115 08/31/20 1130 08/31/20 1200  BP: (!) 103/53 (!) 113/51 (!) 121/50   Pulse: 95 94 89   Resp: 19 (!) 24 19   Temp:    98.6 F (37 C)  TempSrc:      SpO2: 100% 100% 100%    Weight:      Height:        Intake/Output Summary (Last 24 hours) at 08/31/2020 1203 Last data filed at 08/31/2020 0359 Gross per 24 hour  Intake --  Output 906 ml  Net -906 ml   Filed Weights   08/29/20 0528 08/30/20 0402 08/31/20 0500  Weight: 61.7 kg 61.6 kg 64.2 kg    Physical Exam   GEN: Well nourished, well developed, in no acute distress.  HEENT: Grossly normal.  Neck: Supple, difficult to gauge JVP secondary to body habitus. No carotid bruits, or masses. Cardiac: RRR, 3/6 systolic ejection murmur, no rubs or gallops. No clubbing, cyanosis, edema.  Radials/DP/PT 1+ and equal bilaterally.  Respiratory:  Respirations regular and unlabored, diminished breath sounds bilaterally. GI: Obese, soft, nontender, nondistended, BS + x 4. MS: no deformity or atrophy. Skin: warm and dry, no rash. Neuro:  Strength and sensation are intact. Psych: AAOx3.  Normal affect.  Labs    Chemistry Recent Labs  Lab 08/28/20 [phone] 08/28/20 0958 08/29/20 0440 08/30/20 0441 08/31/20 0451  NA 130*   < > 136 137 139  K 4.8   < > 5.2* 5.2* 4.9  CL 90*   < > 93* 92* 97*  CO2 25   < > 27 29 31   GLUCOSE 182*   < > 158* 114* 125*  BUN 66*   < > 36* 62* 30*  CREATININE  7.00*   < > 4.86* 6.49* 3.92*  CALCIUM 8.5*   < > 8.6* 9.1 9.0  PROT 7.0  --   --   --   --   ALBUMIN 4.1  --   --   --   --   AST 17  --   --   --   --   ALT 12  --   --   --   --   ALKPHOS 118  --   --   --   --   BILITOT 0.8  --   --   --   --   GFRNONAA 5*   < > 8* 6* 11*  ANIONGAP 15   < > 16* 16* 11   < > = values in this interval not displayed.     Hematology Recent Labs  Lab 08/30/20 0441 08/30/20 1003 08/31/20 0451  WBC 13.5* 16.1* 14.2*  RBC 2.79* 2.76* 2.63*  HGB 8.3* 8.2* 7.8*  HCT 25.7* 25.1* 23.5*  MCV 92.1 90.9 89.4  MCH 29.7 29.7 29.7  MCHC 32.3 32.7 33.2  RDW 16.3* 16.4* 16.4*  PLT 169 190 175    Cardiac Enzymes  Recent Labs  Lab 08/29/20 2214 08/30/20 0200 08/30/20 0441  08/30/20 2216 08/31/20 0119  TROPONINIHS 461* 706* 1,148* 1,497* 1,186*      BNP Recent Labs  Lab 08/28/20 0959 08/29/20 0440 08/30/20 2216  BNP 697.1* 948.0* 1,601.9*      Lipids  Lab Results  Component Value Date   CHOL 180 01/17/2018   HDL 48 01/17/2018   LDLCALC 101 (H) 01/17/2018   TRIG 154 (H) 01/17/2018   CHOLHDL 3.8 01/17/2018    HbA1c  Lab Results  Component Value Date   HGBA1C 5.1 03/30/2020    Radiology    DG Chest 1 View  Result Date: 08/30/2020 CLINICAL DATA:  Shortness of breath EXAM: CHEST  1 VIEW COMPARISON:  08/28/2020 FINDINGS: Cardiac shadow is stable. Aortic calcifications are again seen. Lungs are well aerated bilaterally. Central vascular congestion is noted with increasing interstitial change consistent with CHF. No sizable effusion is noted. No focal confluent infiltrate is seen. No bony abnormality is noted. IMPRESSION: Changes consistent with CHF. Electronically Signed   By: Inez Catalina M.D.   On: 08/30/2020 21:48   DG Chest Port 1 View  Result Date: 08/28/2020 CLINICAL DATA:  Sepsis.  Shortness of breath. EXAM: PORTABLE CHEST 1 VIEW COMPARISON:  08/05/2020 FINDINGS: Patient rotated right. Midline trachea. Mild cardiomegaly. Atherosclerosis in the transverse aorta. No pleural effusion or pneumothorax. Pulmonary interstitial prominence is slightly increased. Subtle right lower lobe opacity is again suspected. IMPRESSION: Interstitial thickening, felt to be slightly increased. Primarily the sequelae of smoking/chronic bronchitis. Developing mild pulmonary venous congestion cannot be excluded. Lateral right lower lobe opacity, which could represent residual or recurrent pneumonia. Aortic Atherosclerosis (ICD10-I70.0). Electronically Signed   By: Abigail Miyamoto M.D.   On: 08/28/2020 10:40    Telemetry    Sinus rhythm to sinus tachycardia in the low 100s- Personally Reviewed  Cardiac Studies   Cardiac Catheterization 11.2020   Severe, calcified  ostial to distal left main, 80%.  Severe ostial LAD, 99% followed by an eccentric 50 to 70% proximal LAD after the first diagonal.  Ostial to proximal 60% circumflex.  The second obtuse marginal contains segmental 60 to 70% narrowing.  The circumflex is dominant.  Nondominant RCA.  Moderately severe to severe aortic stenosis with valve area of 0.99 cm, peak to  peak gradient 20 mmHg, mean gradient 28 mmHg, based upon cardiac output of 7.2 L/min and corresponding cardiac index of 4.83 L/min/m  Severe ascending and descending aortic calcification   RECOMMENDATIONS    Heart team approach: Determine if patient has surgical options with SAVR and CABG.  Consider extremely high risk PCI which would require hemodynamic support.  Consider palliative care given end-stage kidney disease on dialysis, severe anemia, and poor long-term prognosis even if stent therapy is successful.  Discussed with Dr. Burt Knack. _____________   2D Echocardiogram 6.2021   1. Left ventricular ejection fraction, by estimation, is 60 to 65%. The  left ventricle has normal function. The left ventricle has no regional  wall motion abnormalities. Left ventricular diastolic parameters are  consistent with Grade I diastolic  dysfunction (impaired relaxation). Elevated left atrial pressure.   2. Right ventricular systolic function is normal. The right ventricular  size is normal. Tricuspid regurgitation signal is inadequate for assessing  PA pressure.   3. Left atrial size was mildly dilated.   4. Moderately elevated transmitral gradient is noted, most likely due to  bulky annular calcification. The mitral valve was not well visualized.  Trivial mitral valve regurgitation.   5. The aortic valve has an indeterminant number of cusps. Aortic valve  regurgitation is trivial. Moderate to severe aortic valve stenosis. Aortic  valve mean gradient measures 23.0 mmHg. Aortic valve Vmax measures 3.33  m/s.  _____________    Patient Profile     74 y.o. female  with a hx of chronic chest pain, severe aortic stenosis, ESRD on HD TTS (HD 3 days per wek), recurrent flash pulmonary edema, recent respiratory/PEA arrest 09/2019, anemia of chronic disease, diffuse aortic atherosclerosis, anxiety, DM2, hypertension, hyperlipidemia, obesity, asthma, COPD, OSA, HFpEF,  stroke, GERD, gout, physical deconditioning, who was admitted 10/11 w/ NSTEMI and chest pain.  Assessment & Plan    1. Non-ST segment elevation myocardial infarction/coronary artery disease: Patient with known severe, multivessel coronary artery disease status post catheterization November 2020. She is not felt to be a suitable candidate for bypass and would be very high risk PCI candidate in the setting of severe aortic stenosis. She did have recurrent chest pain last night followed by respiratory distress requiring BiPAP and transfer back to ICU. She reports mild chest discomfort this morning which has been persistent for several hours. She is in no acute distress. Troponin rose to 1600 last night. Follow-up this morning. We discussed with her limited options for management in the setting of known severe coronary disease and aortic stenosis. Blood pressure is currently soft in the setting of hemodialysis resulting in holding of her amlodipine and beta-blocker this morning. She otherwise remains on aspirin, statin, and oral nitrate therapy. She is not considering hospice at this time. She be willing to meet with palliative care to discuss pain management options as she does have chronic pain on narcotic therapy.  2. Severe aortic stenosis: Not felt to be a suitable SAVR or TAVR candidate. Conservative management as above.  3. End-stage renal disease: Undergoing hemodialysis this morning at the bedside. Per nephrology.  4. Essential hypertension: Blood pressure is currently soft in the setting of dialysis. Follow and adjust medications as needed.  5. Hyperlipidemia:  Continue statin therapy.  6. Type 2 diabetes mellitus: Per medicine team.  7. Anemia of chronic disease: H&H low this morning at 7.8 and 23.5. Suspect anemia is playing a role in poor coronary perfusion and non-STEMI. Recommend transfusion to keep hemoglobin greater than  8. Defer to medicine/nephrology teams.  8. Acute hypoxic respiratory failure/COPD exacerbation: Antibiotics and steroids per medicine team.  Signed, Murray Hodgkins, NP  08/31/2020, 12:03 PM    For questions or updates, please contact   Please consult [email or web address] for contact info under Cardiology/STEMI.

## 2020-08-31 NOTE — Progress Notes (Addendum)
Cross Cover Note Rapid called to patientt room.  Patient with medical history significant of ESRD-HD (TTS), hypertension, hyperlipidemia, diet-controlled diabetes, COPD, asthma, stroke, GERD, gout, depression with anxiety, severe aortic stenosis, OSA, CHF, anemia, and chostochondiritis who presents with shortness of breath.  [facility] hospitalization has had recurrent chest pain.  She was admitted for acute on chronic heart failure with flash pulmonary edema.  In past 24 hours, acute chest pain accompanied by continued increase in troponins, now peak [phone] (10/12@0119 ).  NSTEMI - on heparin therapy. She has been un able to tolerate fluid removal with HD.  Cardiology consult today reports patient not candidate for further ischemic evaluation/intervention.   Patient with significant increased work of breathing and rate of 53.  Chest xray increasing interstitial changes consistent with CHF.   Patien no completely anuric per  History and takes daily loop diuretic.   Patient placed on bipap  attempt for diuresis with one dose 100mg  furosemide  Transferred to stepdown -

## 2020-08-31 NOTE — TOC Initial Note (Addendum)
Transition of Care [facility]) - Initial/Assessment Note    Patient Details  Name: Phyllis [name] MRN: [id number] Date of Birth: 07/23/1946  [facility] CM/SW Contact:    Phyllis Ivan, LCSW Phone Number: 08/31/2020, 11:09 AM  Clinical Narrative:                 CSW spoke with patient's Phyllis [name] for Readmission and Heart Failure Screening. Phyllis [name] reported patient lives alone but has privately paid caregivers 24/7. These caregivers provide transportation for patient. PCP is Phyllis [name]. [facility] [place]. Patient went to [facility] about a year ago. DME includes walker, lift chair, and oxygen. Phyllis [name] reported she wonders if patient is on the correct amount of oxygen at home, Mining engineer and MD to inform them of this concern. Phyllis [name] reported patient also has a handicap accessible bathroom. Patient goes to dialysis at [facility] in [place]. Patient has regular follow up with her Cardiologist and monitors her weight on her dialysis days.   Per notes, patient was active with [facility]. Checked with Representative Phyllis [name] who reported patient is still active with them for PT and RN services.  CSW will continue to follow.  Expected Discharge Plan: [place] Barriers to Discharge: Continued Medical Work up   Patient Goals and CMS Choice Patient states their goals for this hospitalization and ongoing recovery are:: to return home with 24/7 care per [name] CMS Medicare.gov Compare Post Acute Care list provided to:: Patient Represenative (must comment) Choice offered to / list presented to : [facility] POA / Guardian  Expected Discharge Plan and Services Expected Discharge Plan: [place]       Living arrangements for the past 2 months: Single Family Home                                      Prior Living Arrangements/Services Living arrangements for the past 2 months: Single Family Home Lives with::  Self Patient language and need for interpreter reviewed:: Yes        Need for Family Participation in Patient Care: Yes (Comment) Care giver support system in place?: Yes (comment) Current home services: Homehealth aide Criminal Activity/Legal Involvement Pertinent to Current Situation/Hospitalization: No - Comment as needed  Activities of Daily Living Home Assistive Devices/Equipment: Walker (specify type) ADL Screening (condition at time of admission) Patient's cognitive ability adequate to safely complete daily activities?: Yes Is the patient deaf or have difficulty hearing?: No Does the patient have difficulty seeing, even when wearing glasses/contacts?: No Does the patient have difficulty concentrating, remembering, or making decisions?: No Patient able to express need for assistance with ADLs?: Yes Does the patient have difficulty dressing or bathing?: Yes Independently performs ADLs?: Yes (appropriate for developmental age) Does the patient have difficulty walking or climbing stairs?: Yes Weakness of Legs: Both Weakness of Arms/Hands: Both  Permission Sought/Granted Permission sought to share information with : Chartered certified accountant granted to share information with : Yes, Verbal Permission Granted              Emotional Assessment         Alcohol / Substance Use: Not Applicable Psych Involvement: No (comment)  Admission diagnosis:  Flash pulmonary edema (HCC) [J81.0] Hypoxia [R09.02] Acute on chronic respiratory failure with hypoxia (HCC) [J96.21] Acute respiratory failure with hypoxia ([place]) [J96.01]  Patient Active Problem List   Diagnosis Date Noted  . Acute respiratory failure with hypoxia ([place]) 08/29/2020  . Acute on chronic respiratory failure with hypoxia ([place]) 08/28/2020  . Depression with anxiety 08/28/2020  . Type II diabetes mellitus with renal manifestations ([place]) 08/28/2020  . Acute on chronic diastolic (congestive) heart failure  ([place]) 04/30/2020  . COPD with chronic bronchitis ([place]) 04/30/2020  . Dyspnea 04/20/2020  . Asymptomatic bacteriuria 12/05/2019  . COPD with acute exacerbation ([place]) 10/23/2019  . Elevated troponin 10/23/2019  . CAD (coronary artery disease) 10/23/2019  . COPD exacerbation ([place]) 10/23/2019  . Leukocytosis 10/23/2019  . Anxiety 10/23/2019  . Palliative care by specialist   . Goals of care, counseling/discussion   . Flash pulmonary edema ([place]) 10/14/2019  . Respiratory failure with hypoxia ([place]) 10/11/2019  . 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-stage renal disease) ([place])   . Severe aortic valve stenosis   . Acute respiratory failure with hypoxemia ([place]) 09/20/2019  . COPD with acute bronchitis ([place]) 09/05/2019  . Congestive heart failure ([place]) 05/24/2019  . Hyperkalemia 11/29/2018  . Chronic diastolic heart failure ([place]) 11/02/2018  . HTN (hypertension) 11/02/2018  . Uncontrolled hypertension 10/21/2018  . History of acute pulmonary edema 10/21/2018  . Pressure injury of skin 03/16/2018  . CVA (cerebral vascular accident) ([place]) 01/16/2018  . Aortic atherosclerosis ([place]) 12/11/2017  . Chest pain 09/18/2017  . Hyperlipidemia 08/06/2015  . Complication from renal dialysis device 04/12/2015  . SOB (shortness of breath) 02/01/2015  . ESRD (end stage renal disease) on dialysis ([place]) 02/01/2015  . Type 2 diabetes mellitus with other specified complication ([place]) 97/41/6384  . Asthma 02/01/2015   PCP:  [name], MD Pharmacy:   [facility], [place] [address] Phone: [phone] Fax: [place], [place] - [place] [address] [place] [address] Phone: [phone] Fax: [phone]  [id number] [place], [place] - [address] [place] [address] Phone: [phone] Fax: [phone]     Social Determinants of Health  (SDOH) Interventions    Readmission Risk Interventions Readmission Risk Prevention Plan 08/31/2020 04/22/2020 03/31/2020  Transportation Screening Complete Complete Complete  Medication Review (RN Care Manager) Complete Complete Complete  PCP or Specialist appointment within 3-5 days of discharge Complete (No Data) -  [place] or Home Care Consult Complete Complete -  SW Recovery Care/Counseling Consult Complete - -  Palliative Care Screening - Not Applicable -  [place] Complete Not Applicable -  Some recent data might be hidden

## 2020-08-31 NOTE — Significant Event (Signed)
RR page received at 2122. Sharion Settler, NP went to see and assess patient in place of RRT nurse.  After assessment and immediate interventions, it was decided that the patient be transferred to stepdown for closer observation.

## 2020-08-31 NOTE — Progress Notes (Addendum)
PROGRESS NOTE    Phyllis [name]  JIR:[id number] DOB: 16-Apr-1946 DOA: 08/28/2020 PCP: [name], MD   Brief Narrative: Taken from H&P and prior notes. Phyllis Fornes Crutchfieldis a 74 y.o.femalewith medical history significant ofESRD-HD (TTS), hypertension, hyperlipidemia, diet-controlled diabetes, COPD, asthma, stroke, GERD, gout, depression with anxiety, severe aortic stenosis, OSA, CHF, anemia, who presented with shortness of breath. Patient also developed some chest pain and elevated troponin.  Multiple comorbidities.  She is not a candidate for further ischemic work-up all any other procedure per cardiology. See cardiology PA note for the summary of her recurrent hospitalizations and comorbidities. Palliative care was consulted today.  Subjective: Patient developed another episode of chest pain and shortness of breath overnight requiring BiPAP and transferred to stepdown. This morning patient continued to have some chest discomfort, off from the BiPAP.  Continues to feel some shortness of breath.  Again discussed regarding continuation of dialysis and medical management as she is not a candidate for any surgical intervention and has very extensive CAD along with severe aortic stenosis.  Patient is very imminent that she will remain full code and asking for every possible treatment, stating that she is not ready to go yet. Palliative care was also consulted.  Assessment & Plan:   Principal Problem:   Acute on chronic respiratory failure with hypoxia (HCC) Active Problems:   ESRD (end stage renal disease) on dialysis (HCC)   Hyperlipidemia   CVA (cerebral vascular accident) ([place])   Chronic diastolic heart failure (HCC)   HTN (hypertension)   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-stage renal disease) ([place])   Flash pulmonary edema (HCC)   COPD exacerbation (HCC)   Depression with anxiety   Type II diabetes mellitus with renal manifestations ([place])   Acute respiratory failure with hypoxia  (HCC)  Elevated troponin with chest pain.  Cardiology was consulted with concern of NSTEMI.  Patient also has an history of chronic costochondritis.  Troponin peaked at 1497, not trending down.  Another episode with worsening shortness of breath requiring BiPAP overnight. Per cardiology note patient has multiple comorbidities along with a prior PEA arrest requiring resuscitation.  She is not a candidate for any further ischemic work-up or intervention.  Per cardiology note her severe aortic stenosis is very high risk for any procedure or even TAVR but they will ask their specialist again to review her slides again as she is very Administrator, arts for aggressive care.  They were recommending palliative care consult which was placed. -Continue with medical management.  Acute hypoxic respiratory failure/COPD exacerbation.  Patient again required BiPAP overnight.  Off the BiPAP now. Patient has multiple hospital visits with similar symptoms.  Most likely secondary to volume overload.  Symptoms improved with dialysis. She was saturating well on 2 L of oxygen -Continue with chest PT. -Continue with incentive spirometry and flutter valve. -Solu-Medrol switched to prednisone 40 mg daily for another 2 days. -Continue azithromycin for total of 5 days. -Continue with DuoNeb.  Severe aortic stenosis.  Not a candidate for any surgical intervention or TAVR. -Continue with medical management. -Palliative care consult.  ESRD (end stage renal disease) on dialysis ([place]).  Patient normally had dialysis Tuesday, Thursday and Saturday we then added dialysis every other week on Monday. -Continue dialysis per nephrology protocol and her schedule.  Hyperkalemia.  Resolved -Going for another session of dialysis today. -Losartan was discontinued per nephrology.  Hyperlipidemia -cont home statin  CVA (cerebral vascular accident) ([place]) -Continue aspirin, pravastatin  Chronic diastolic heart failure ([place]): 2D echo  on  04/20/2020 showed EF 60-65% with grade 1 diastolic dysfunction.  -Volume management per renal by dialysis -cont home Lasix 80 mg daily  HTN (hypertension) -IV hydralazine as needed -Continue home amlodipine, Coreg, hydralazine, and Lasix -d/c losartan, per nephrology  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-stage renal disease) ([place]):  -Ordered 1 unit of PRBC at cardiologist's request as they want to keep hemoglobin above 8. -Giving Lasix with transfusion. -EPO with HD  Chronic pain on chronic opioids Rx confirmed on PDMP -continue home Norco PRN  Depression with anxiety --Pt reported significant anxiety which causes dyspnea PLAN: -continue home Remeron and Sertraline. -Continue Atarax PRN for anxiety -avoid benzo since already on opioids  Diet controlledType II diabetes mellitus with renal manifestations ([place]): Recent A1c 5.1, well controlled. Patient is not taking medications at home. -no need for fingersticks.  Objective: Vitals:   08/31/20 1200 08/31/20 1218 08/31/20 1230 08/31/20 1350  BP: (!) 108/47 (!) 111/49 (!) 108/43 (!) 119/56  Pulse: 87     Resp: (!) 22     Temp: 98.6 F (37 C)     TempSrc:      SpO2: 100%     Weight:      Height:        Intake/Output Summary (Last 24 hours) at 08/31/2020 1439 Last data filed at 08/31/2020 1350 Gross per 24 hour  Intake --  Output 2406 ml  Net -2406 ml   Filed Weights   08/29/20 0528 08/30/20 0402 08/31/20 0500  Weight: 61.7 kg 61.6 kg 64.2 kg    Examination:  General.  Well-developed lady, in no acute distress. Pulmonary.  Bilateral basal crackles, normal respiratory effort. CV.  Regular rate and rhythm, no JVD, rub or murmur. Abdomen.  Soft, nontender, nondistended, BS positive. CNS.  Alert and oriented x3.  No focal neurologic deficit. Extremities.  No edema, no cyanosis, pulses intact and symmetrical. Psychiatry.  Judgment and insight appears normal.   DVT prophylaxis: Heparin Code Status: Full Family  Communication: Caregiver was updated at bedside Disposition Plan:  Status is: Inpatient  Remains inpatient appropriate because:Inpatient level of care appropriate due to severity of illness   Dispo: The patient is from: Home              Anticipated d/c is to: Home              Anticipated d/c date is: 1-2 days.              Patient currently is not medically stable to d/c.  Consultants:   Cardiology  Nephrology  Palliative care  Procedures:  Antimicrobials:   Data Reviewed: I have personally reviewed following labs and imaging studies  CBC: Recent Labs  Lab 08/28/20 0958 08/29/20 0440 08/30/20 0441 08/30/20 1003 08/31/20 0451  WBC 12.2* 7.2 13.5* 16.1* 14.2*  NEUTROABS 9.3*  --   --   --   --   HGB 10.1* 8.3* 8.3* 8.2* 7.8*  HCT 30.7* 25.1* 25.7* 25.1* 23.5*  MCV 89.0 88.4 92.1 90.9 89.4  PLT 208 140* 169 190 301   Basic Metabolic Panel: Recent Labs  Lab 08/28/20 0958 08/29/20 0440 08/30/20 0441 08/31/20 0451  NA 130* 136 137 139  K 4.8 5.2* 5.2* 4.9  CL 90* 93* 92* 97*  CO2 25 27 29 31   GLUCOSE 182* 158* 114* 125*  BUN 66* 36* 62* 30*  CREATININE 7.00* 4.86* 6.49* 3.92*  CALCIUM 8.5* 8.6* 9.1 9.0  MG  --   --  2.8* 2.1  GFR: Estimated Creatinine Clearance: 10 mL/min (A) (by C-G formula based on SCr of 3.92 mg/dL (H)). Liver Function Tests: Recent Labs  Lab 08/28/20 0958  AST 17  ALT 12  ALKPHOS 118  BILITOT 0.8  PROT 7.0  ALBUMIN 4.1   No results for input(s): LIPASE, AMYLASE in the last 168 hours. No results for input(s): AMMONIA in the last 168 hours. Coagulation Profile: Recent Labs  Lab 08/28/20 0958  INR 1.0   Cardiac Enzymes: No results for input(s): CKTOTAL, CKMB, CKMBINDEX, TROPONINI in the last 168 hours. BNP (last 3 results) No results for input(s): PROBNP in the last 8760 hours. HbA1C: No results for input(s): HGBA1C in the last 72 hours. CBG: Recent Labs  Lab 08/28/20 1953 08/30/20 2129  GLUCAP 205* 199*   Lipid  Profile: No results for input(s): CHOL, HDL, LDLCALC, TRIG, CHOLHDL, LDLDIRECT in the last 72 hours. Thyroid Function Tests: No results for input(s): TSH, T4TOTAL, FREET4, T3FREE, THYROIDAB in the last 72 hours. Anemia Panel: No results for input(s): VITAMINB12, FOLATE, FERRITIN, TIBC, IRON, RETICCTPCT in the last 72 hours. Sepsis Labs: Recent Labs  Lab 08/28/20 0958 08/28/20 0959  PROCALCITON 0.24  --   LATICACIDVEN  --  1.8    Recent Results (from the past 240 hour(s))  Blood culture (routine single)     Status: None (Preliminary result)   Collection Time: 08/28/20  9:59 AM   Specimen: BLOOD  Result Value Ref Range Status   Specimen Description BLOOD RAC  Final   Special Requests   Final    BOTTLES DRAWN AEROBIC AND ANAEROBIC Blood Culture adequate volume   Culture   Final    NO GROWTH 3 DAYS Performed at [facility], [address] [place], [place] [address]    Report Status PENDING  Incomplete  Respiratory Panel by RT PCR (Flu A&B, Covid) - Nasopharyngeal Swab     Status: None   Collection Time: 08/28/20  9:59 AM   Specimen: Nasopharyngeal Swab  Result Value Ref Range Status   SARS Coronavirus 2 by RT PCR NEGATIVE NEGATIVE Final    Comment: (NOTE) SARS-CoV-2 target nucleic acids are NOT DETECTED.  The SARS-CoV-2 RNA is generally detectable in upper respiratoy specimens during the acute phase of infection. The lowest concentration of SARS-CoV-2 viral copies this assay can detect is 131 copies/mL. A negative result does not preclude SARS-Cov-2 infection and should not be used as the sole basis for treatment or other patient management decisions. A negative result may occur with  improper specimen collection/handling, submission of specimen other than nasopharyngeal swab, presence of viral mutation(s) within the areas targeted by this assay, and inadequate number of viral copies (<131 copies/mL). A negative result must be combined with clinical observations,  patient history, and epidemiological information. The expected result is Negative.  Fact Sheet for Patients:  PinkCheek.be  Fact Sheet for Healthcare Providers:  GravelBags.it  This test is no t yet approved or cleared by the [place] FDA and  has been authorized for detection and/or diagnosis of SARS-CoV-2 by FDA under an Emergency Use Authorization (EUA). This EUA will remain  in effect (meaning this test can be used) for the duration of the COVID-19 declaration under Section 564(b)(1) of the Act, 21 U.S.C. section 360bbb-3(b)(1), unless the authorization is terminated or revoked sooner.     Influenza A by PCR NEGATIVE NEGATIVE Final   Influenza B by PCR NEGATIVE NEGATIVE Final    Comment: (NOTE) The Xpert Xpress SARS-CoV-2/FLU/RSV assay is intended  as an aid in  the diagnosis of influenza from Nasopharyngeal swab specimens and  should not be used as a sole basis for treatment. Nasal washings and  aspirates are unacceptable for Xpert Xpress SARS-CoV-2/FLU/RSV  testing.  Fact Sheet for Patients: PinkCheek.be  Fact Sheet for Healthcare Providers: GravelBags.it  This test is not yet approved or cleared by the [place] FDA and  has been authorized for detection and/or diagnosis of SARS-CoV-2 by  FDA under an Emergency Use Authorization (EUA). This EUA will remain  in effect (meaning this test can be used) for the duration of the  Covid-19 declaration under Section 564(b)(1) of the Act, 21  U.S.C. section 360bbb-3(b)(1), unless the authorization is  terminated or revoked. Performed at [facility], [address], [place], [place] [address]      Radiology Studies: DG Chest 1 View  Result Date: 08/30/2020 CLINICAL DATA:  Shortness of breath EXAM: CHEST  1 VIEW COMPARISON:  08/28/2020 FINDINGS: Cardiac shadow is stable. Aortic calcifications  are again seen. Lungs are well aerated bilaterally. Central vascular congestion is noted with increasing interstitial change consistent with CHF. No sizable effusion is noted. No focal confluent infiltrate is seen. No bony abnormality is noted. IMPRESSION: Changes consistent with CHF. Electronically Signed   By: Inez Catalina M.D.   On: 08/30/2020 21:48    Scheduled Meds: . amLODipine  5 mg Oral Daily  . aspirin EC  81 mg Oral Daily  . azithromycin  250 mg Oral Daily  . calcium acetate  1,334 mg Oral TID WC  . carvedilol  3.125 mg Oral BID WC  . epoetin (EPOGEN/PROCRIT) injection  10,000 Units Intravenous Q T,Th,Sa-HD  . furosemide  80 mg Oral Daily  . ipratropium-albuterol  3 mL Nebulization Q4H WA  . isosorbide mononitrate  30 mg Oral Daily  . lidocaine  1 patch Transdermal Q24H  . mirtazapine  15 mg Oral QHS  . oxybutynin  15 mg Oral Daily  . pravastatin  40 mg Oral QHS  . predniSONE  40 mg Oral Q breakfast  . sertraline  50 mg Oral QHS  . sodium chloride flush  3 mL Intravenous Q12H   Continuous Infusions: . sodium chloride    . heparin 550 Units/hr (08/31/20 0807)     LOS: 2 days   Time spent: 35 minutes.  Lorella Nimrod, MD [facility]  If 7PM-7AM, please contact night-coverage [email or web address]  08/31/2020, 2:39 PM   This record has been created using Systems analyst. Errors have been sought and corrected,but may not always be located. Such creation errors do not reflect on the standard of care.

## 2020-08-31 NOTE — Progress Notes (Signed)
1820 Interesting day. Patient is very demanding and accustomed to a 24 hour care giver at home. She compalined about having to wait for anything. Dialysis ran smoothly but her call bed was misplaced in the finishing moments. The patient called her emergency contact to notify the unit she had no call bed. The patient then called via her cell phone 5 minutes later to complain that no one had showed up to give her the call bell. She demanded to talk to the charge nurse. Later when she calmed down.

## 2020-08-31 NOTE — Progress Notes (Signed)
   08/30/20 2128  Assess: MEWS Score  Temp 98.3 F (36.8 C)  BP 139/79  Pulse Rate (!) 136  Resp (!) 52  Level of Consciousness Alert  SpO2 97 %  O2 Device Nasal Cannula  O2 Flow Rate (L/min) 2 L/min  Assess: MEWS Score  MEWS Temp 0  MEWS Systolic 0  MEWS Pulse 3  MEWS RR 3  MEWS LOC 0  MEWS Score 6  MEWS Score Color Red  Assess: if the MEWS score is Yellow or Red  Were vital signs taken at a resting state? Yes  Focused Assessment No change from prior assessment  Early Detection of Sepsis Score *See Row Information* Medium  MEWS guidelines implemented *See Row Information* No, previously red, continue vital signs every 4 hours  Treat  MEWS Interventions Administered scheduled meds/treatments;Administered prn meds/treatments;Escalated (See documentation below);Consulted Respiratory Therapy;Other (Comment) (RRT)  Pain Score 10  Complains of Shortness of breath  Interventions Medication (see MAR)  Patients response to intervention Relief  Take Vital Signs  Increase Vital Sign Frequency  Red: Q 1hr X 4 then Q 4hr X 4, if remains red, continue Q 4hrs  Escalate  MEWS: Escalate Red: discuss with charge nurse/RN and provider, consider discussing with RRT  Notify: Provider  Provider Name/Title Sharion Settler NP  Date Provider Notified 08/30/20  Time Provider Notified 2125  Notification Type Face-to-face  Notification Reason Change in status  Response See new orders  Date of Provider Response 08/30/20  Time of Provider Response 2125  Notify: Rapid Response  Date Rapid Response Notified 08/30/20  Time Rapid Response Notified 2130  Document  Patient Outcome Transferred/level of care increased

## 2020-09-01 ENCOUNTER — Inpatient Hospital Stay (HOSPITAL_COMMUNITY)
Admit: 2020-09-01 | Discharge: 2020-09-01 | Disposition: A | Discharge status: Home / Self Care | Payer: Medicare Other | Attending: Internal Medicine | Admitting: Internal Medicine

## 2020-09-01 DIAGNOSIS — I2510 Atherosclerotic heart disease of native coronary artery without angina pectoris: Secondary | ICD-10-CM | POA: Diagnosis not present

## 2020-09-01 DIAGNOSIS — I350 Nonrheumatic aortic (valve) stenosis: Secondary | ICD-10-CM

## 2020-09-01 DIAGNOSIS — J9621 Acute and chronic respiratory failure with hypoxia: Secondary | ICD-10-CM | POA: Diagnosis not present

## 2020-09-01 DIAGNOSIS — I5032 Chronic diastolic (congestive) heart failure: Secondary | ICD-10-CM | POA: Diagnosis not present

## 2020-09-01 DIAGNOSIS — Z7189 Other specified counseling: Secondary | ICD-10-CM

## 2020-09-01 DIAGNOSIS — J441 Chronic obstructive pulmonary disease with (acute) exacerbation: Secondary | ICD-10-CM | POA: Diagnosis not present

## 2020-09-01 DIAGNOSIS — J810 Acute pulmonary edema: Secondary | ICD-10-CM | POA: Diagnosis not present

## 2020-09-01 DIAGNOSIS — Z515 Encounter for palliative care: Secondary | ICD-10-CM

## 2020-09-01 LAB — BASIC METABOLIC PANEL
Anion gap: 14 (ref 5–15)
BUN: 29 mg/dL — ABNORMAL HIGH (ref 8–23)
CO2: 31 mmol/L (ref 22–32)
Calcium: 9.2 mg/dL (ref 8.9–10.3)
Chloride: 95 mmol/L — ABNORMAL LOW (ref 98–111)
Creatinine, Ser: 3.35 mg/dL — ABNORMAL HIGH (ref 0.44–1.00)
GFR, Estimated: 13 mL/min — ABNORMAL LOW (ref >60)
Glucose, Bld: 104 mg/dL — ABNORMAL HIGH (ref 70–99)
Potassium: 4.1 mmol/L (ref 3.5–5.1)
Sodium: 140 mmol/L (ref 135–145)

## 2020-09-01 LAB — CBC
HCT: 26 % — ABNORMAL LOW (ref 36.0–46.0)
Hemoglobin: 8.7 g/dL — ABNORMAL LOW (ref 12.0–15.0)
MCH: 30 pg (ref 26.0–34.0)
MCHC: 33.5 g/dL (ref 30.0–36.0)
MCV: 89.7 fL (ref 80.0–100.0)
Platelets: 150 10*3/uL (ref 150–400)
RBC: 2.9 MIL/uL — ABNORMAL LOW (ref 3.87–5.11)
RDW: 16.1 % — ABNORMAL HIGH (ref 11.5–15.5)
WBC: 8 10*3/uL (ref 4.0–10.5)
nRBC: 0 % (ref 0.0–0.2)

## 2020-09-01 LAB — ECHOCARDIOGRAM COMPLETE
AR max vel: 0.69 cm2
AV Area VTI: 0.85 cm2
AV Area mean vel: 0.67 cm2
AV Mean grad: 19 mmHg
AV Peak grad: 31.7 mmHg
Ao pk vel: 2.82 m/s
Area-P 1/2: 3.99 cm2
Height: 58 in
S' Lateral: 3.14 cm
Weight: 2264.57 oz

## 2020-09-01 LAB — HEPARIN LEVEL (UNFRACTIONATED): Heparin Unfractionated: 0.42 IU/mL (ref 0.30–0.70)

## 2020-09-01 LAB — GLUCOSE, CAPILLARY
Glucose-Capillary: 86 mg/dL (ref 70–99)
Glucose-Capillary: 119 mg/dL — ABNORMAL HIGH (ref 70–99)

## 2020-09-01 LAB — MAGNESIUM: Magnesium: 2.2 mg/dL (ref 1.7–2.4)

## 2020-09-01 MED ORDER — CHLORHEXIDINE GLUCONATE CLOTH 2 % EX PADS
6.0000 | MEDICATED_PAD | Freq: Every day | CUTANEOUS | Status: DC
  Administered 2020-09-01 – 2020-09-03 (×3): 6 via TOPICAL

## 2020-09-01 MED ORDER — ISOSORBIDE MONONITRATE ER 60 MG PO TB24
60.0000 mg | ORAL_TABLET | Freq: Every day | ORAL | Status: DC
  Administered 2020-09-02 – 2020-09-03 (×2): 60 mg via ORAL
  Filled 2020-09-01 (×2): qty 1

## 2020-09-01 MED ORDER — ISOSORBIDE MONONITRATE ER 30 MG PO TB24
30.0000 mg | ORAL_TABLET | Freq: Once | ORAL | Status: AC
  Administered 2020-09-01: 30 mg via ORAL
  Filled 2020-09-01: qty 1

## 2020-09-01 NOTE — Progress Notes (Signed)
[facility]  ROUNDING NOTE   Subjective:   PRBC transfusion yesterday.  Hemodialysis treatment yesterday. UF of 1.5 liters. Tolerated treatment well.   Home health aide at bedside.     Objective:  Vital signs in last 24 hours:  Temp:  [97.8 F (36.6 C)-98.7 F (37.1 C)] 98.3 F (36.8 C) (10/13 0400) Pulse Rate:  [78-100] 94 (10/13 0800) Resp:  [16-24] 18 (10/13 0800) BP: (92-125)/(43-97) 125/97 (10/13 0800) SpO2:  [96 %-100 %] 97 % (10/13 0800)  Weight change:  Filed Weights   08/29/20 0528 08/30/20 0402 08/31/20 0500  Weight: 61.7 kg 61.6 kg 64.2 kg    Intake/Output: I/O last 3 completed shifts: In: 760 [Blood:760] Out: 9024 [Urine:15; Other:1500]   Intake/Output this shift:  No intake/output data recorded.  Physical Exam: General: Laying in bed  Head: Normocephalic, atraumatic. Moist oral mucosal membranes  Eyes: Anicteric  Neck: Supple, trachea midline  Lungs:  +wheezing bilaterally. On [place] O2  Heart: Regular rate and rhythm, +murmur  Abdomen:  Soft, nontender, obese  Extremities: No peripheral edema.  Neurologic: Alert,oriented,moving all four extremities  Skin: No acute  Lesions or rashes  Access: Left AVF +thrill,+ bruit    Basic Metabolic Panel: Recent Labs  Lab 08/28/20 0958 08/28/20 0958 08/29/20 0440 08/29/20 0440 08/30/20 0441 08/31/20 0451 09/01/20 0333  NA 130*  --  136  --  137 139 140  K 4.8  --  5.2*  --  5.2* 4.9 4.1  CL 90*  --  93*  --  92* 97* 95*  CO2 25  --  27  --  29 31 31   GLUCOSE 182*  --  158*  --  114* 125* 104*  BUN 66*  --  36*  --  62* 30* 29*  CREATININE 7.00*  --  4.86*  --  6.49* 3.92* 3.35*  CALCIUM 8.5*   < > 8.6*   < > 9.1 9.0 9.2  MG  --   --   --   --  2.8* 2.1 2.2   < > = values in this interval not displayed.    Liver Function Tests: Recent Labs  Lab 08/28/20 0958  AST 17  ALT 12  ALKPHOS 118  BILITOT 0.8  PROT 7.0  ALBUMIN 4.1   No results for input(s): LIPASE, AMYLASE in the last  168 hours. No results for input(s): AMMONIA in the last 168 hours.  CBC: Recent Labs  Lab 08/28/20 0958 08/28/20 0958 08/29/20 0440 08/30/20 0441 08/30/20 1003 08/31/20 0451 09/01/20 0333  WBC 12.2*   < > 7.2 13.5* 16.1* 14.2* 8.0  NEUTROABS 9.3*  --   --   --   --   --   --   HGB 10.1*   < > 8.3* 8.3* 8.2* 7.8* 8.7*  HCT 30.7*   < > 25.1* 25.7* 25.1* 23.5* 26.0*  MCV 89.0   < > 88.4 92.1 90.9 89.4 89.7  PLT 208   < > 140* 169 190 175 150   < > = values in this interval not displayed.    Cardiac Enzymes: No results for input(s): CKTOTAL, CKMB, CKMBINDEX, TROPONINI in the last 168 hours.  BNP: Invalid input(s): POCBNP  CBG: Recent Labs  Lab 08/28/20 1953 08/30/20 2129 09/01/20 0722  GLUCAP 205* 199* 59    Microbiology: Results for orders placed or performed during the hospital encounter of 08/28/20  Blood culture (routine single)     Status: None (Preliminary result)   Collection  Time: 08/28/20  9:59 AM   Specimen: BLOOD  Result Value Ref Range Status   Specimen Description BLOOD RAC  Final   Special Requests   Final    BOTTLES DRAWN AEROBIC AND ANAEROBIC Blood Culture adequate volume   Culture   Final    NO GROWTH 4 DAYS Performed at [facility], [address], [place], [place] [address]    Report Status PENDING  Incomplete  Respiratory Panel by RT PCR (Flu A&B, Covid) - Nasopharyngeal Swab     Status: None   Collection Time: 08/28/20  9:59 AM   Specimen: Nasopharyngeal Swab  Result Value Ref Range Status   SARS Coronavirus 2 by RT PCR NEGATIVE NEGATIVE Final    Comment: (NOTE) SARS-CoV-2 target nucleic acids are NOT DETECTED.  The SARS-CoV-2 RNA is generally detectable in upper respiratoy specimens during the acute phase of infection. The lowest concentration of SARS-CoV-2 viral copies this assay can detect is 131 copies/mL. A negative result does not preclude SARS-Cov-2 infection and should not be used as the sole basis for treatment  or other patient management decisions. A negative result may occur with  improper specimen collection/handling, submission of specimen other than nasopharyngeal swab, presence of viral mutation(s) within the areas targeted by this assay, and inadequate number of viral copies (<131 copies/mL). A negative result must be combined with clinical observations, patient history, and epidemiological information. The expected result is Negative.  Fact Sheet for Patients:  PinkCheek.be  Fact Sheet for Healthcare Providers:  GravelBags.it  This test is no t yet approved or cleared by the [place] FDA and  has been authorized for detection and/or diagnosis of SARS-CoV-2 by FDA under an Emergency Use Authorization (EUA). This EUA will remain  in effect (meaning this test can be used) for the duration of the COVID-19 declaration under Section 564(b)(1) of the Act, 21 U.S.C. section 360bbb-3(b)(1), unless the authorization is terminated or revoked sooner.     Influenza A by PCR NEGATIVE NEGATIVE Final   Influenza B by PCR NEGATIVE NEGATIVE Final    Comment: (NOTE) The Xpert Xpress SARS-CoV-2/FLU/RSV assay is intended as an aid in  the diagnosis of influenza from Nasopharyngeal swab specimens and  should not be used as a sole basis for treatment. Nasal washings and  aspirates are unacceptable for Xpert Xpress SARS-CoV-2/FLU/RSV  testing.  Fact Sheet for Patients: PinkCheek.be  Fact Sheet for Healthcare Providers: GravelBags.it  This test is not yet approved or cleared by the [place] FDA and  has been authorized for detection and/or diagnosis of SARS-CoV-2 by  FDA under an Emergency Use Authorization (EUA). This EUA will remain  in effect (meaning this test can be used) for the duration of the  Covid-19 declaration under Section 564(b)(1) of the Act, 21  U.S.C.  section 360bbb-3(b)(1), unless the authorization is  terminated or revoked. Performed at [facility], [address] [place], [place] [address]     Coagulation Studies: No results for input(s): LABPROT, INR in the last 72 hours.  Urinalysis: No results for input(s): COLORURINE, LABSPEC, PHURINE, GLUCOSEU, HGBUR, BILIRUBINUR, KETONESUR, PROTEINUR, UROBILINOGEN, NITRITE, LEUKOCYTESUR in the last 72 hours.  Invalid input(s): APPERANCEUR    Imaging: DG Chest 1 View  Result Date: 08/30/2020 CLINICAL DATA:  Shortness of breath EXAM: CHEST  1 VIEW COMPARISON:  08/28/2020 FINDINGS: Cardiac shadow is stable. Aortic calcifications are again seen. Lungs are well aerated bilaterally. Central vascular congestion is noted with increasing interstitial change consistent with CHF. No  sizable effusion is noted. No focal confluent infiltrate is seen. No bony abnormality is noted. IMPRESSION: Changes consistent with CHF. Electronically Signed   By: Inez Catalina M.D.   On: 08/30/2020 21:48     Medications:   . sodium chloride     . amLODipine  5 mg Oral Daily  . aspirin EC  81 mg Oral Daily  . azithromycin  250 mg Oral Daily  . calcium acetate  1,334 mg Oral TID WC  . carvedilol  3.125 mg Oral BID WC  . Chlorhexidine Gluconate Cloth  6 each Topical Daily  . epoetin (EPOGEN/PROCRIT) injection  10,000 Units Intravenous Q T,Th,Sa-HD  . furosemide  80 mg Oral Daily  . ipratropium-albuterol  3 mL Nebulization Q4H WA  . isosorbide mononitrate  30 mg Oral Daily  . lidocaine  1 patch Transdermal Q24H  . mirtazapine  15 mg Oral QHS  . oxybutynin  15 mg Oral Daily  . pravastatin  40 mg Oral QHS  . predniSONE  40 mg Oral Q breakfast  . sertraline  50 mg Oral QHS  . sodium chloride flush  3 mL Intravenous Q12H   sodium chloride, acetaminophen, albuterol, dextromethorphan-guaiFENesin, hydrALAZINE, HYDROcodone-acetaminophen, hydrOXYzine, lidocaine-prilocaine, nitroGLYCERIN, ondansetron  (ZOFRAN) IV, sodium chloride flush  Assessment/ Plan:  [name] is a 74 y.o. white female with end stage renal disease on hemodialysis, hypertension, aortic stenosis, COPD, diabetes mellitus type II, chronic costochrondritis who was admitted to [facility] on 08/28/2020 for Flash pulmonary edema ([place]) [J81.0] Hypoxia [R09.02] Acute on chronic respiratory failure with hypoxia ([place]) [J96.21] Acute respiratory failure with hypoxia ([place]) [J96.01]  [facility] TTS [place]. Left AVF 60kg  1. End Stage Renal Disease with hyperkalemia: emergent hemodialysis treatment on admission.  Seems patient has not been getting to her dry weight as an outpatient. Change outpatient weight to 62kg.  Will continue TTS schedule. Next dialysis treatment for tomorrow.   2. Hypertension: 125/97 - elevated diastolic - On Amlodipine,Furosemide and Hydralazine  3. Anemia with chronic kidney disease: status post PRBC transfusion on 10/12. Hemoglobin 8.7 - Epogen 10,000 units with dialysis TTS  4. Secondary Hyperparathyroidism:  - calcium acetate with meals    LOS: 3 Rahman Ferrall 10/13/202110:17 AM

## 2020-09-01 NOTE — Progress Notes (Signed)
Progress Note  [name] Date of Encounter: 09/01/2020  Primary Cardiologist: Ida Rogue, MD  Subjective   1 episode of nitrate responsive c/p last night.  None since.  Inpatient Medications    Scheduled Meds: . aspirin EC  81 mg Oral Daily  . calcium acetate  1,334 mg Oral TID WC  . carvedilol  3.125 mg Oral BID WC  . Chlorhexidine Gluconate Cloth  6 each Topical Daily  . epoetin (EPOGEN/PROCRIT) injection  10,000 Units Intravenous Q T,Th,Sa-HD  . furosemide  80 mg Oral Daily  . ipratropium-albuterol  3 mL Nebulization Q4H WA  . isosorbide mononitrate  30 mg Oral Daily  . lidocaine  1 patch Transdermal Q24H  . mirtazapine  15 mg Oral QHS  . oxybutynin  15 mg Oral Daily  . pravastatin  40 mg Oral QHS  . predniSONE  40 mg Oral Q breakfast  . sertraline  50 mg Oral QHS  . sodium chloride flush  3 mL Intravenous Q12H   Continuous Infusions: . sodium chloride     PRN Meds: sodium chloride, acetaminophen, albuterol, dextromethorphan-guaiFENesin, hydrALAZINE, HYDROcodone-acetaminophen, hydrOXYzine, lidocaine-prilocaine, nitroGLYCERIN, ondansetron (ZOFRAN) IV, sodium chloride flush   Vital Signs    Vitals:   09/01/20 0900 09/01/20 1000 09/01/20 1056 09/01/20 1200  BP: (!) 114/52 (!) 125/47  116/77  Pulse: 92 84  92  Resp: 20 19  19   Temp:      TempSrc:      SpO2: 97% 97% 98% 99%  Weight:      Height:        Intake/Output Summary (Last 24 hours) at 09/01/2020 1238 Last data filed at 09/01/2020 0800 Gross per 24 hour  Intake 885 ml  Output 1500 ml  Net -615 ml   Filed Weights   08/29/20 0528 08/30/20 0402 08/31/20 0500  Weight: 61.7 kg 61.6 kg 64.2 kg    Physical Exam   GEN: Well nourished, well developed, in no acute distress.  HEENT: Grossly normal.  Neck: Supple, no JVD, carotid bruits, or masses. Cardiac: RRR, 3/6 SEM @ upper sternal borders, no rubs, or gallops. No clubbing, cyanosis, edema.  Radials/DP/PT 2+ and equal  bilaterally.  Respiratory:  Respirations regular and unlabored, bibasilar crackles. GI: Soft, nontender, nondistended, BS + x 4. MS: no deformity or atrophy. Skin: warm and dry, no rash. Neuro:  Strength and sensation are intact. Psych: AAOx3.  Normal affect.  Labs    Chemistry Recent Labs  Lab 08/28/20 [id number] 08/29/20 0440 08/30/20 0441 08/31/20 0451 09/01/20 0333  NA 130*   < > 137 139 140  K 4.8   < > 5.2* 4.9 4.1  CL 90*   < > 92* 97* 95*  CO2 25   < > 29 31 31   GLUCOSE 182*   < > 114* 125* 104*  BUN 66*   < > 62* 30* 29*  CREATININE 7.00*   < > 6.49* 3.92* 3.35*  CALCIUM 8.5*   < > 9.1 9.0 9.2  PROT 7.0  --   --   --   --   ALBUMIN 4.1  --   --   --   --   AST 17  --   --   --   --   ALT 12  --   --   --   --   ALKPHOS 118  --   --   --   --   BILITOT 0.8  --   --   --   --  GFRNONAA 5*   < > 6* 11* 13*  ANIONGAP 15   < > 16* 11 14   < > = values in this interval not displayed.     Hematology Recent Labs  Lab 08/30/20 1003 08/31/20 0451 09/01/20 0333  WBC 16.1* 14.2* 8.0  RBC 2.76* 2.63* 2.90*  HGB 8.2* 7.8* 8.7*  HCT 25.1* 23.5* 26.0*  MCV 90.9 89.4 89.7  MCH 29.7 29.7 30.0  MCHC 32.7 33.2 33.5  RDW 16.4* 16.4* 16.1*  PLT 190 175 150    Cardiac Enzymes  Recent Labs  Lab 08/30/20 0200 08/30/20 0441 08/30/20 2216 08/31/20 0119 08/31/20 1239  TROPONINIHS 706* 1,148* 1,497* 1,186* 7,200*      BNP Recent Labs  Lab 08/28/20 0959 08/29/20 0440 08/30/20 2216  BNP 697.1* 948.0* 1,601.9*     Lipids  Lab Results  Component Value Date   CHOL 180 01/17/2018   HDL 48 01/17/2018   LDLCALC 101 (H) 01/17/2018   TRIG 154 (H) 01/17/2018   CHOLHDL 3.8 01/17/2018    HbA1c  Lab Results  Component Value Date   HGBA1C 5.1 03/30/2020    Radiology    DG Chest 1 View  Result Date: 08/30/2020 CLINICAL DATA:  Shortness of breath EXAM: CHEST  1 VIEW COMPARISON:  08/28/2020 FINDINGS: Cardiac shadow is stable. Aortic calcifications are again seen.  Lungs are well aerated bilaterally. Central vascular congestion is noted with increasing interstitial change consistent with CHF. No sizable effusion is noted. No focal confluent infiltrate is seen. No bony abnormality is noted. IMPRESSION: Changes consistent with CHF. Electronically Signed   By: Inez Catalina M.D.   On: 08/30/2020 21:48    Telemetry    RSR - Personally Reviewed  Cardiac Studies   Cardiac Catheterization 11.2020   Severe, calcified ostial to distal left main, 80%.  Severe ostial LAD, 99% followed by an eccentric 50 to 70% proximal LAD after the first diagonal.  Ostial to proximal 60% circumflex. The second obtuse marginal contains segmental 60 to 70% narrowing. The circumflex is dominant.  Nondominant RCA.  Moderately severe to severe aortic stenosis with valve area of 0.99 cm, peak to peak gradient 20 mmHg, mean gradient 28 mmHg, based upon cardiac output of 7.2 L/min and corresponding cardiac index of 4.83 L/min/m  Severe ascending and descending aortic calcification  RECOMMENDATIONS   Heart team approach: Determine if patient has surgical options with SAVR and CABG. Consider extremely high risk PCI which would require hemodynamic support. Consider palliative care given end-stage kidney disease on dialysis, severe anemia, and poor long-term prognosis even if stent therapy is successful.  Discussed with Dr. Burt Knack. _____________   2D Echocardiogram 6.2021  1. Left ventricular ejection fraction, by estimation, is 60 to 65%. The  left ventricle has normal function. The left ventricle has no regional  wall motion abnormalities. Left ventricular diastolic parameters are  consistent with Grade I diastolic  dysfunction (impaired relaxation). Elevated left atrial pressure.  2. Right ventricular systolic function is normal. The right ventricular  size is normal. Tricuspid regurgitation signal is inadequate for assessing  PA pressure.  3. Left atrial  size was mildly dilated.  4. Moderately elevated transmitral gradient is noted, most likely due to  bulky annular calcification. The mitral valve was not well visualized.  Trivial mitral valve regurgitation.  5. The aortic valve has an indeterminant number of cusps. Aortic valve  regurgitation is trivial. Moderate to severe aortic valve stenosis. Aortic  valve mean gradient measures 23.0 mmHg. Aortic  valve Vmax measures 3.33  m/s.  _____________   Patient Profile   74 y.o. female with a hx of chronic chest pain,severe aortic stenosis, ESRD on HD TTS(HD 3 days per wek), recurrent flash pulmonary edema, recent respiratory/PEA arrest11/2020, anemia of chronic disease,diffuseaortic atherosclerosis, anxiety, DM2, hypertension, hyperlipidemia, obesity, asthma, COPD, OSA, HFpEF, stroke, GERD, gout, physical deconditioning, who was admitted 10/11 w/ NSTEMI and chest pain.  Assessment & Plan    1.  Non-STEMI/CAD: Patient known severe, multivessel CAD status post catheterization November 2020.  Admitted October 11 with chest pain.  Had recurrent chest pain on the evening of the 11th with dyspnea requiring BiPAP.  Troponin subsequently rose to 7200 yesterday afternoon.  She had one episode of nitrate responsive chest pain last night.  Her films were again reviewed by our interventional team at [facility] and she is not felt to be a suitable candidate for PCI given complex anatomy and severe aortic stenosis.  Continued medical therapy is again recommended.  She remains on aspirin, statin and beta-blocker.  We have discontinued calcium channel blocker given soft blood pressures and in order to make room for additional titration of nitrate therapy.  Could consider Ranexa.  Patient understands that she is likely to have chest pain and that the primary goal here is limiting symptoms and preventing MI.  She would be potentially open to a second opinion at [facility] as an outpatient.  2.  Severe aortic stenosis: Not  felt to be suitable SAVR or TAVR candidate.  Conservative management as above.  3.  End-stage renal disease: Hemodialysis per nephrology.  4.  Essential hypertension: Pressure stable in the 1 teens to 120s.  We discontinued amlodipine to make room for additional titration of nitrate therapy.  5.  Hyperlipidemia: Continue statin therapy.  6.  Type 2 diabetes mellitus: Per medicine team.  7.  Anemia of chronic disease: H&H 8.7 and 26 after transfusion yesterday.  Seems to be feeling a little bit better today.  8.  Acute hypoxic respiratory failure/COPD exacerbation: Steroids and inhalers per medicine team.  Signed, Murray Hodgkins, NP  09/01/2020, 12:38 PM    For questions or updates, please contact   Please consult [email or web address] for contact info under Cardiology/STEMI.

## 2020-09-01 NOTE — Consult Note (Signed)
Consultation Note Date: 09/01/2020   [name]  DOB: 1946/01/03  MRN: [id number]  Age / Sex: 73 y.o., female   PCP: [name], MD Referring Physician: Gwynne Edinger, MD   REASON FOR CONSULTATION:Establishing goals of care  Palliative Care consult requested for goals of care discussion in this 74 y.o. female with multiple medical problems including ESRD-HD (TTS), hypertension, hyperlipidemia, diet-controlled diabetes, COPD, asthma, stroke, GERD, gout, depression with anxiety, severe aortic stenosis, OSA, CHF, and anemia.  Patient presented to the ED from home with complaints of shortness of breath.  She was due for dialysis the day of admission.  Per EMS report oxygen saturation in the upper 60s requiring EMS to apply CPAP.  BiPAP started in the ED.  Chest x-ray show interstitial thickening and possible vascular congestion.  Mild lateral right lower lobe opacity.  BNP 697.  Covid PCR negative.  Creatinine 7.0.  BUN 66.  Patient has been evaluated and followed by cardiology with recommendation for medical management.  Patient is not a candidate for further ischemic work-up or procedures.  Clinical Assessment and Goals of Care: I have reviewed medical records including lab results, imaging, Epic notes, and MAR, received report from the bedside RN, and assessed the patient. I met at the bedside with Phyllis [name] to discuss diagnosis prognosis, GOC, EOL wishes, disposition and options.  Patient is awake, alert and oriented x3.  Denies pain.  Reports "this is the best day she has had in a long time".  States her breathing is much better.  States during the night she experienced chest pain which was weakly resolved with nitroglycerin.  Phyllis [name] is familiar to our team as we have been involved in her care during previous admissions since 2020.  Most recent involvement June 2021.  I re-introduced Palliative Medicine as specialized medical care for people  living with serious illness. It focuses on providing relief from the symptoms and stress of a serious illness. The goal is to improve quality of life for both the patient and the family.  Patient verbalized understanding and appreciation.  She reports she "thinks" that she has seen outpatient palliative in the past.  We discussed a brief life review of the patient, along with her functional and nutritional status.  Patient reports she lives at home alone with 24/7 hired caregivers.  She has never married and does not have any children.  Phyllis [name] tells me she feels that she has been doing well at home with her caregiver support.  She reports being ambulatory with a walker for assistance.  She is able to perform most ADLs independently with some set up assistance such as dressing and transferring when taking a shower.  She reports that she has a great appetite.  She does endorse occasional fatigue and shortness of breath requiring rest breaks at times.  We discussed Her current illness and what it means in the larger context of Her on-going co-morbidities. With specific discussions regarding end-stage renal disease and cardiac challenges.  Natural disease trajectory and expectations at EOL were discussed.  I attempted to elicit values and goals of care important to the patient.    Phyllis [name] realizes her understanding of her current illnesses and comorbidities.  She shares her frustration guarding multiple providers encouraging her to accept hospice and that her condition has reached limitations.  Therapeutic listening provided.  Education provided to patient emphasizing her medical team/providers would like to be as open and transparent with  her overall condition so that she can make the best decisions for her health and quality of life.  I explained by offering recommendations and information regarding what care is available is most appropriate with hopes of eliminating a false sense of  healing/improvement.  Patient verbalized understanding and appreciation.  She expresses "those are everyone's medical opinion and know how I feel and that is all that matters"  Patient is clear in her expressed goals to continue with current level of care, she is very much open for rehospitalization's if needed, wishes to continue with hemodialysis, expresses hopes of improvement in her chest pain/cardiac health with the use of medications, in addition to requesting home oxygen when she has "episodes".  Phyllis [name] states she would not like to discuss anything further if it is not focusing on aggressively providing treatment and allowing her every opportunity to show improvement.  She remains hopeful for improvement in returning home with hired caregivers.  I created space and opportunity to further discuss limits including artificial feedings and life-sustaining measures.  I discussed at length patient's full CODE STATUS with consideration of her current illness and comorbidities.  Patient verbalized understanding and continues to request full scope, aggressive medical interventions including CPR and mechanical ventilation if needed.  She reports she has undergone CPR in the past and has survived and would not question if she would want to do it again.  She does state she is okay with only short-term intubation and would never want to have a tracheostomy or PEG.  If that occurred she would then want to be allowed to pass away naturally without those interventions.  Patient has documented advanced directives which were reviewed in epic.  Patient confirms her medical decision makers are Phyllis [name] and Phyllis [name] who are both long-term friends.  Hospice and Palliative Care services outpatient were explained and offered. Patient and family verbalized her understanding and awareness of both palliative and hospice's goals and philosophy of care.  She reports she is definitely not open to hospice and feels that she is  already been followed by palliative.  She reports she will follow up with her caregivers and let us know.  If she is not currently being followed she would be open to outpatient palliative support once discharged.  I discussed with Ms. Billet the importance of continued conversations with her decision makers and medical providers regarding her overall plan of care with emphasis on goals of care and quality of life.  She verbalized understanding and appreciation.  Questions and concerns were addressed.  Patient was encouraged to call with questions or concerns.  PMT will continue to support holistically.   SOCIAL HISTORY:     reports that she has never smoked. She has never used smokeless tobacco. She reports that she does not drink alcohol and does not use drugs.  CODE STATUS: Full code  ADVANCE DIRECTIVES: Primary Decision Maker: Patient is able to make her own medical decisions.  In the event Ms. Mulkern is unable to make decisions for herself her documented [facility] POA are Phyllis [name] and Phyllis [name] (long-term friends)  SYMPTOM MANAGEMENT: Per attending  Palliative Prophylaxis:   Frequent Pain Assessment  PSYCHO-SOCIAL/SPIRITUAL:  Support System: Family  Desire for further Chaplaincy support: No  Additional Recommendations (Limitations, Scope, Preferences):  Full Scope Treatment and No Tracheostomy  Education on hospice/palliative    PAST MEDICAL HISTORY: Past Medical History:  Diagnosis Date  . (HFpEF) heart failure with preserved ejection fraction ([place])    a. TTE 01/2014: nl  LV sys fxn, no valvular abnormalities; b. TTE 11/16: nl EF, mild LVH;  c. 04/2019 Echo: EF 60-65%. DD. Nl RV fxn. Mod AS. 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  . Allergy   . Anemia of chronic disease   . Anxiety   . Aortic atherosclerosis ([place])   . Asthma   . Chronic back pain   . COPD (chronic obstructive pulmonary disease) ([place])   . Diabetes mellitus with complication ([place])   . ESRD on  hemodialysis ([place])    a. Tues/Sat; b. 2/2 small kidneys  . Essential hypertension   . Fistula    lower left arm  . GERD (gastroesophageal reflux disease)   . Gout   . History of exercise stress test    a. 01/2014: no evidence of ischemia; b. Lexiscan 08/2015: no sig ischemia, severe GI uptake artifact, low risk; c. CPET @ [facility] 09/2016: exercised 3 min 12 sec on bike without incline, 2.28 METs, VO2 of 8.1, 48% of predicted, indicating mod to sev functional impairment, evidence of blunted HR, stroke volume, and BP augmentation as well as ventilation-perfusion mismatch with exercise  . HLD (hyperlipidemia)   . Non-obstructive Carotid arterial disease ([place])    a. 12/2017: <50% bilat ICA dzs.  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 Severe aortic stenosis    a. by 09/2019 echo b. 04/2019 Echo: Mod AS.  Marland Kitchen Sleep apnea     ALLERGIES:  is allergic to enalapril maleate; nitrofurantoin; sulfamethoxazole-trimethoprim; 2,4-d dimethylamine (amisol); baclofen; neosporin [neomycin-bacitracin zn-polymyx]; quinine; ultram [tramadol]; zocor [simvastatin]; bactrim [sulfamethoxazole-trimethoprim]; levodopa; macrodantin [nitrofurantoin macrocrystal]; and quinine derivatives.   MEDICATIONS:  Current Facility-Administered Medications  Medication Dose Route Frequency Provider Last Rate Last Admin  . 0.9 %  sodium chloride infusion  250 mL Intravenous PRN Ivor Costa, MD      . acetaminophen (TYLENOL) tablet 650 mg  650 mg Oral Q6H PRN Ivor Costa, MD   650 mg at 08/30/20 1025  . albuterol (PROVENTIL) (2.5 MG/3ML) 0.083% nebulizer solution 2.5 mg  2.5 mg Nebulization Q4H PRN Ivor Costa, MD   2.5 mg at 08/29/20 1429  . aspirin EC tablet 81 mg  81 mg Oral Daily Ivor Costa, MD   81 mg at 09/01/20 1058  . calcium acetate (PHOSLO) capsule 1,334 mg  1,334 mg Oral TID WC Ivor Costa, MD   1,334 mg at 09/01/20 1203  . carvedilol (COREG) tablet 3.125 mg  3.125 mg Oral BID WC Ivor Costa, MD   3.125 mg at  09/01/20 0841  . Chlorhexidine Gluconate Cloth 2 % PADS 6 each  6 each Topical Daily Wouk, Ailene Rud, MD   6 each at 09/01/20 1437  . dextromethorphan-guaiFENesin (MUCINEX DM) 30-600 MG per 12 hr tablet 1 tablet  1 tablet Oral BID PRN Ivor Costa, MD   1 tablet at 09/01/20 1444  . epoetin alfa (EPOGEN) injection 10,000 Units  10,000 Units Intravenous Q T,Th,Sa-HD Kolluru, Sarath, MD   10,000 Units at 08/31/20 1137  . furosemide (LASIX) tablet 80 mg  80 mg Oral Daily Ivor Costa, MD   80 mg at 09/01/20 1059  . hydrALAZINE (APRESOLINE) injection 5 mg  5 mg Intravenous Q2H PRN Ivor Costa, MD      . HYDROcodone-acetaminophen [facility]) 7.5-325 MG per tablet 1 tablet  1 tablet Oral Q6H PRN Oswald Hillock, RPH   1 tablet at 08/31/20 5170  . hydrOXYzine (ATARAX/VISTARIL) tablet 10 mg  10 mg Oral Q6H  PRN Enzo Bi, MD   10 mg at 08/29/20 1747  . ipratropium-albuterol (DUONEB) 0.5-2.5 (3) MG/3ML nebulizer solution 3 mL  3 mL Nebulization Q4H WA Enzo Bi, MD   3 mL at 09/01/20 1056  . [START ON 09/02/2020] isosorbide mononitrate (IMDUR) 24 hr tablet 60 mg  60 mg Oral Daily Theora Gianotti, NP      . lidocaine (LIDODERM) 5 % 1 patch  1 patch Transdermal Q24H Sharion Settler, NP   1 patch at 09/01/20 [phone]  . lidocaine-prilocaine (EMLA) cream 1 application  1 application Topical PRN Ivor Costa, MD      . mirtazapine (REMERON) tablet 15 mg  15 mg Oral QHS Ivor Costa, MD   15 mg at 08/31/20 2137  . nitroGLYCERIN (NITROSTAT) SL tablet 0.4 mg  0.4 mg Sublingual Q5 min PRN Sharion Settler, NP   0.4 mg at 08/31/20 1657  . ondansetron (ZOFRAN) injection 4 mg  4 mg Intravenous Q8H PRN Ivor Costa, MD   4 mg at 08/28/20 1216  . oxybutynin (DITROPAN-XL) 24 hr tablet 15 mg  15 mg Oral Daily Ivor Costa, MD   15 mg at 09/01/20 1059  . pravastatin (PRAVACHOL) tablet 40 mg  40 mg Oral QHS Ivor Costa, MD   40 mg at 08/31/20 2137  . predniSONE (DELTASONE) tablet 40 mg  40 mg Oral Q breakfast Enzo Bi, MD   40 mg at  09/01/20 0839  . sertraline (ZOLOFT) tablet 50 mg  50 mg Oral QHS Ivor Costa, MD   50 mg at 08/31/20 2137  . sodium chloride flush (NS) 0.9 % injection 3 mL  3 mL Intravenous Q12H Ivor Costa, MD   3 mL at 08/31/20 2137  . sodium chloride flush (NS) 0.9 % injection 3 mL  3 mL Intravenous PRN Ivor Costa, MD        VITAL SIGNS: BP (!) 117/52 (BP Location: Right Arm)   Pulse 88   Temp 98.6 F (37 C) (Oral)   Resp (!) 23   Ht 4' 10"  (1.473 m)   Wt 64.2 kg   SpO2 100%   BMI 29.58 kg/m  Filed Weights   08/29/20 0528 08/30/20 0402 08/31/20 0500  Weight: 61.7 kg 61.6 kg 64.2 kg    Estimated body mass index is 29.58 kg/m as calculated from the following:   Height as of this encounter: 4' 10"  (1.473 m).   Weight as of this encounter: 64.2 kg.  LABS: CBC:    Component Value Date/Time   WBC 8.0 09/01/2020 0333   HGB 8.7 (L) 09/01/2020 0333   HGB 9.4 (L) 12/10/2014 0404   HCT 26.0 (L) 09/01/2020 0333   HCT 28.5 (L) 12/10/2014 0404   PLT 150 09/01/2020 0333   PLT 136 (L) 12/10/2014 0404   Comprehensive Metabolic Panel:    Component Value Date/Time   NA 140 09/01/2020 0333   NA 142 12/10/2014 0404   K 4.1 09/01/2020 0333   K 3.6 12/10/2014 0404   BUN 29 (H) 09/01/2020 0333   BUN 19 (H) 12/10/2014 0404   CREATININE 3.35 (H) 09/01/2020 0333   CREATININE 3.30 (H) 12/10/2014 0404   ALBUMIN 4.1 08/28/2020 0958   ALBUMIN 2.5 (L) 12/07/2014 0609     Review of Systems  Neurological: Positive for weakness.  Unless otherwise noted, a complete review of systems is negative.  Physical Exam General: NAD, chronically-ill appearing Pulmonary:  diminished bilaterally, 98% 2L/[place]  Skin: no rashes, warm and dry  Neurological: Weak, alert  and oriented x3, mood appropriate   Prognosis: Guarded-Poor   Discharge Planning:  Patient reports goal is to return home with hired caregivers and outpatient Palliative support. Open to [facility]  Recommendations: . Full Code/full  scope as confirmed by patient . Continue with current plan of care per medical team, continue HD . No trach/No Peg . Patient clear in her expressed goals of care to continue to treat the treatable aggressively, remaining hopeful for some improvement in the ability to return home with her caregivers.  Continues to state that she feels like she is not ready to give up or die.  Declines hospice or any discussions centered around their care. . Outpatient Palliative support at discharge. This will be essential to continue ongoing discussions.  Marland Kitchen PMT will continue to support and follow. Please call team line with urgent needs.   Palliative Performance Scale: PS 30/40%              Patient  expressed understanding and was in agreement with this plan.   Thank you for allowing the Palliative Medicine Team to assist in the care of this patient.  Time In: 1040 Time Out: 1130 Time Total: 50 min.   Visit consisted of counseling and education dealing with the complex and emotionally intense issues of symptom management and palliative care in the setting of serious and potentially life-threatening illness.Greater than 50%  of this time was spent counseling and coordinating care related to the above assessment and plan.  Signed by:  Alda Lea, AGPCNP-BC Palliative Medicine Team  Phone: [phone] Pager: [phone] Amion: Bjorn Pippin

## 2020-09-01 NOTE — Progress Notes (Signed)
ANTICOAGULATION CONSULT NOTE -  Pharmacy Consult for Heparin Drip Indication: chest pain/ACS  Allergies  Allergen Reactions  . Enalapril Maleate Other (See Comments)    Other reaction(s): Headache  . Nitrofurantoin Swelling and Rash    Other Reaction: swelling of body  . Sulfamethoxazole-Trimethoprim Swelling  . 2,4-D Dimethylamine (Amisol) Rash and Other (See Comments)    Other Reaction: h/a  . Baclofen Other (See Comments) and Nausea Only    lightheadness ,drowsiness , muscle weakness , twitching in hands   . Neosporin [Neomycin-Bacitracin Zn-Polymyx] Other (See Comments) and Rash    Other Reaction: irritation Skin irritation   . Quinine Nausea And Vomiting, Rash and Other (See Comments)    Other Reaction: Vomiting, rash, h/a, vision  . Ultram [Tramadol] Palpitations  . Zocor [Simvastatin] Other (See Comments) and Rash    Other Reaction: muscle spasms Muscle pain and spasms  . Bactrim [Sulfamethoxazole-Trimethoprim] Swelling  . Levodopa Other (See Comments)    Reaction: unknown  . Macrodantin [Nitrofurantoin Macrocrystal] Swelling  . Quinine Derivatives Other (See Comments)    Vertigo,nausea vomiting blurred vision headache ears sensitivity     Patient Measurements: Height: 4\' 10"  (147.3 cm) Weight: 64.2 kg (141 lb 8.6 oz) IBW/kg (Calculated) : 40.9 Heparin Dosing Weight: 54.3 kg  Vital Signs: Temp: 98.6 F (37 C) (10/13 0000) Temp Source: Oral (10/13 0000) BP: 105/51 (10/12 2245) Pulse Rate: 85 (10/12 2245)  Labs: Recent Labs    08/30/20 0441 08/30/20 0441 08/30/20 1003 08/30/20 1003 08/30/20 1430 08/30/20 2216 08/31/20 0119 08/31/20 0451 08/31/20 1239 09/01/20 0333  HGB 8.3*   < > 8.2*   < >  --   --   --  7.8*  --  8.7*  HCT 25.7*   < > 25.1*  --   --   --   --  23.5*  --  26.0*  PLT 169   < > 190  --   --   --   --  175  --  150  HEPARINUNFRC 0.95*  --   --   --    < > 0.48  --  0.33  --  0.42  CREATININE 6.49*  --   --   --   --   --   --  3.92*   --  3.35*  TROPONINIHS 1,148*   < >  --   --   --  1,497* 1,186*  --  7,200*  --    < > = values in this interval not displayed.    Estimated Creatinine Clearance: 11.7 mL/min (A) (by C-G formula based on SCr of 3.35 mg/dL (H)).   Medical History: Past Medical History:  Diagnosis Date  . (HFpEF) heart failure with preserved ejection fraction ([place])    a. TTE 01/2014: nl LV sys fxn, no valvular abnormalities; b. TTE 11/16: nl EF, mild LVH;  c. 04/2019 Echo: EF 60-65%. DD. Nl RV fxn. Mod AS. 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  . Allergy   . Anemia of chronic disease   . Anxiety   . Aortic atherosclerosis ([place])   . Asthma   . Chronic back pain   . COPD (chronic obstructive pulmonary disease) ([place])   . Diabetes mellitus with complication ([place])   . ESRD on hemodialysis ([place])    a. Tues/Sat; b. 2/2 small kidneys  . Essential hypertension   . Fistula    lower left arm  . GERD (gastroesophageal reflux disease)   . Gout   .  History of exercise stress test    a. 01/2014: no evidence of ischemia; b. Lexiscan 08/2015: no sig ischemia, severe GI uptake artifact, low risk; c. CPET @ [facility] 09/2016: exercised 3 min 12 sec on bike without incline, 2.28 METs, VO2 of 8.1, 48% of predicted, indicating mod to sev functional impairment, evidence of blunted HR, stroke volume, and BP augmentation as well as ventilation-perfusion mismatch with exercise  . HLD (hyperlipidemia)   . Non-obstructive Carotid arterial disease ([place])    a. 12/2017: <50% bilat ICA dzs.  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 Severe aortic stenosis    a. by 09/2019 echo b. 04/2019 Echo: Mod AS.  Marland Kitchen Sleep apnea    Assessment: Patient is a 74yo female presented with chest pain. PMH chronic chest pain with history of costochondritis, sev aortic stenosis, ESRD on HD (TTS), recurrent flash pulmonary edema, chronic anemia (epogen 10,000 units with dialysis), HFpEF, COPD, and asthma. 09/2019 LHC  revealed severe left main and ostial LAD disease. She had been considered extremely high risk for PCI and deemed not a candidate for CABG/TAVR. Pharmacy consulted for Heparin dosing and monitoring.   Troponin 32>266>461>706>1148 Baseline aPTT 13, INR 1, Hgb 10.1, Plt 208 Hgb and Plt trending down - monitor  10/11 HL @0441  0.95; supratherapeutic, rate decreased to 550units/hr 10/11 HL @1430  0.58; therapeutic x1 10/11 HL @ 2215 0.48, therapeutic x 2 10/12 HL @ 0451 0.33, therapeutic x 3 10/13 HL @ 0333 0.42, therapeutic x 4, CBC stable  Goal of Therapy:  Heparin level 0.3-0.7 units/ml Monitor platelets by anticoagulation protocol: Yes   Plan:  HL therapeutic, will continue current rate at 550 units/hr Check HL daily w/ CBC Continue to monitor H&H and platelets    Ena Dawley, PharmD 09/01/2020 5:33 AM

## 2020-09-01 NOTE — Progress Notes (Addendum)
PROGRESS NOTE    Phyllis [name]  GGY:[id number] DOB: [birth year] DOA: 08/28/2020 PCP: Phyllis Harrier, MD   Brief Narrative: Taken from H&P and prior notes. Phyllis Gilchrest Crutchfieldis a 74 y.o.femalewith medical history significant ofESRD-HD (TTS), hypertension, hyperlipidemia, diet-controlled diabetes, COPD, asthma, stroke, GERD, gout, depression with anxiety, severe aortic stenosis, OSA, CHF, anemia, who presented with shortness of breath. Patient also developed some chest pain and elevated troponin.  Multiple comorbidities.  She is not a candidate for further ischemic work-up all any other procedure per cardiology. See cardiology PA note for the summary of her recurrent hospitalizations and comorbidities. Palliative care was consulted 10/12.  Subjective: Overnight says was "best night yet." No chest pain or palpitations, no shortness of breath. No nausea or vomiting, has appetite.     Assessment & Plan:   Principal Problem:   Acute on chronic respiratory failure with hypoxia (HCC) Active Problems:   ESRD (end stage renal disease) on dialysis (HCC)   Hyperlipidemia   CVA (cerebral vascular accident) ([place])   Chronic diastolic heart failure (HCC)   HTN (hypertension)   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-stage renal disease) ([place])   Flash pulmonary edema (HCC)   COPD exacerbation (HCC)   Depression with anxiety   Type II diabetes mellitus with renal manifestations ([place])   Acute respiratory failure with hypoxia (HCC)  NSTEMI in setting of known multi-vessel CAD and moderate to severe aortic stenosis.  Cardiology was consulted with concern of NSTEMI.  Patient also has an history of chronic costochondritis.  Troponin peaked at 1497, not trending down. Required bipap night of 10/11. Per cardiology note patient has multiple comorbidities along with a prior PEA arrest requiring resuscitation.  She is not a candidate for any further ischemic work-up or intervention.  Per cardiology note her severe  aortic stenosis is very high risk for any procedure or even TAVR but they will ask their specialist again to review her slides again as she is very Administrator, arts for aggressive care. - They were recommending palliative care consult which was placed. - stable for transfer to progressive bed today -Continue with medical management. Off amlod due to low BPs. Continuing aspirin statin and carvedilol  - continue heparin GTT, have message cardiology to clarify duration  Acute hypoxic respiratory failure/COPD exacerbation.  Required bipap, now off and breathing comfortably. Patient has multiple hospital visits with similar symptoms.  Most likely secondary to volume overload.  Symptoms improved with dialysis. She is saturating well on 2 L of oxygen -Continue with chest PT. -Continue with incentive spirometry and flutter valve. -Solu-Medrol switched to prednisone 40 mg daily for another 1-2 days (steroids 10/9- -last dose azithromycin today for total of 5 days (10/9-10/13). -Continue with DuoNeb.  Severe aortic stenosis.  Not a candidate for any surgical intervention or TAVR. -Continue with medical management. -Palliative care consult.  ESRD (end stage renal disease) on dialysis ([place]).  Patient normally had dialysis Tuesday, Thursday and Saturday we then added dialysis every other week on Monday. -Continue dialysis per nephrology protocol and her schedule.  Hyperkalemia.  Resolved -Going for another session of dialysis today. -Losartan was discontinued per nephrology.  Hyperlipidemia -cont home statin  CVA (cerebral vascular accident) ([place]) -Continue aspirin, pravastatin  Chronic diastolic heart failure ([place]): 2D echo on 04/20/2020 showed EF 60-65% with grade 1 diastolic dysfunction.  -Volume management per renal by dialysis -cont home Lasix 80 mg daily  HTN (hypertension) -IV hydralazine as needed -Continue home Coreg, hydralazine, and Lasix -d/c losartan, per nephrology  Anemia  in  ESRD (end-stage renal disease) ([place]):  -Ordered 1 unit of PRBC at cardiologist's request as they want to keep hemoglobin above 8. H 8.7 this morning -Giving Lasix with transfusion. - f/u stool guaiac -EPO with HD  Chronic pain on chronic opioids Rx confirmed on PDMP -continue home Norco PRN  Depression with anxiety --Pt reported significant anxiety which causes dyspnea PLAN: -continue home Remeron and Sertraline. -Continue Atarax PRN for anxiety -avoid benzo since already on opioids  Diet controlledType II diabetes mellitus with renal manifestations ([place]): Recent A1c 5.1, well controlled. Patient is not taking medications at home. -no need for fingersticks.  Objective: Vitals:   09/01/20 0400 09/01/20 0700 09/01/20 0743 09/01/20 0800  BP: (!) 113/48 (!) 116/48  (!) 125/97  Pulse: 80 78  94  Resp: (!) 21 20  18   Temp: 98.3 F (36.8 C)     TempSrc: Oral     SpO2: 98% 99% 98% 97%  Weight:      Height:        Intake/Output Summary (Last 24 hours) at 09/01/2020 0845 Last data filed at 08/31/2020 2245 Gross per 24 hour  Intake 760 ml  Output 1500 ml  Net -740 ml   Filed Weights   08/29/20 0528 08/30/20 0402 08/31/20 0500  Weight: 61.7 kg 61.6 kg 64.2 kg    Examination:  General.  Well-developed lady, in no acute distress. Pulmonary.  Bilateral basal crackles, normal respiratory effort. CV.  Regular rate and rhythm, no JVD, moderate systolic murmur Abdomen.  Soft, nontender, nondistended, BS positive. CNS.  Alert and oriented x3.  No focal neurologic deficit. Extremities.  No edema, no cyanosis, pulses intact and symmetrical. Psychiatry.  Judgment and insight appears normal.   DVT prophylaxis: Heparin tx dose Code Status: Full Family Communication: none at bedside Disposition Plan:  Status is: Inpatient  Remains inpatient appropriate because:Inpatient level of care appropriate due to severity of illness   Dispo: The patient is from: Home               Anticipated d/c is to: Home              Anticipated d/c date is: 1-2 days.              Patient currently is not medically stable to d/c.  Consultants:   Cardiology  Nephrology  Palliative care  Procedures:  Antimicrobials: azithromycin 10/9 -  Data Reviewed: I have personally reviewed following labs and imaging studies  CBC: Recent Labs  Lab 08/28/20 0958 08/28/20 0958 08/29/20 0440 08/30/20 0441 08/30/20 1003 08/31/20 0451 09/01/20 0333  WBC 12.2*   < > 7.2 13.5* 16.1* 14.2* 8.0  NEUTROABS 9.3*  --   --   --   --   --   --   HGB 10.1*   < > 8.3* 8.3* 8.2* 7.8* 8.7*  HCT 30.7*   < > 25.1* 25.7* 25.1* 23.5* 26.0*  MCV 89.0   < > 88.4 92.1 90.9 89.4 89.7  PLT 208   < > 140* 169 190 175 150   < > = values in this interval not displayed.   Basic Metabolic Panel: Recent Labs  Lab 08/28/20 0958 08/29/20 0440 08/30/20 0441 08/31/20 0451 09/01/20 0333  NA 130* 136 137 139 140  K 4.8 5.2* 5.2* 4.9 4.1  CL 90* 93* 92* 97* 95*  CO2 25 27 29 31 31   GLUCOSE 182* 158* 114* 125* 104*  BUN 66* 36* 62* 30* 29*  CREATININE 7.00* 4.86* 6.49* 3.92* 3.35*  CALCIUM 8.5* 8.6* 9.1 9.0 9.2  MG  --   --  2.8* 2.1 2.2   GFR: Estimated Creatinine Clearance: 11.7 mL/min (A) (by C-G formula based on SCr of 3.35 mg/dL (H)). Liver Function Tests: Recent Labs  Lab 08/28/20 0958  AST 17  ALT 12  ALKPHOS 118  BILITOT 0.8  PROT 7.0  ALBUMIN 4.1   No results for input(s): LIPASE, AMYLASE in the last 168 hours. No results for input(s): AMMONIA in the last 168 hours. Coagulation Profile: Recent Labs  Lab 08/28/20 0958  INR 1.0   Cardiac Enzymes: No results for input(s): CKTOTAL, CKMB, CKMBINDEX, TROPONINI in the last 168 hours. BNP (last 3 results) No results for input(s): PROBNP in the last 8760 hours. HbA1C: No results for input(s): HGBA1C in the last 72 hours. CBG: Recent Labs  Lab 08/28/20 1953 08/30/20 2129 09/01/20 0722  GLUCAP 205* 199* 86   Lipid  Profile: No results for input(s): CHOL, HDL, LDLCALC, TRIG, CHOLHDL, LDLDIRECT in the last 72 hours. Thyroid Function Tests: No results for input(s): TSH, T4TOTAL, FREET4, T3FREE, THYROIDAB in the last 72 hours. Anemia Panel: No results for input(s): VITAMINB12, FOLATE, FERRITIN, TIBC, IRON, RETICCTPCT in the last 72 hours. Sepsis Labs: Recent Labs  Lab 08/28/20 0958 08/28/20 0959  PROCALCITON 0.24  --   LATICACIDVEN  --  1.8    Recent Results (from the past 240 hour(s))  Blood culture (routine single)     Status: None (Preliminary result)   Collection Time: 08/28/20  9:59 AM   Specimen: BLOOD  Result Value Ref Range Status   Specimen Description BLOOD RAC  Final   Special Requests   Final    BOTTLES DRAWN AEROBIC AND ANAEROBIC Blood Culture adequate volume   Culture   Final    NO GROWTH 4 DAYS Performed at [facility], [address] [place], [place] [address]    Report Status PENDING  Incomplete  Respiratory Panel by RT PCR (Flu A&B, Covid) - Nasopharyngeal Swab     Status: None   Collection Time: 08/28/20  9:59 AM   Specimen: Nasopharyngeal Swab  Result Value Ref Range Status   SARS Coronavirus 2 by RT PCR NEGATIVE NEGATIVE Final    Comment: (NOTE) SARS-CoV-2 target nucleic acids are NOT DETECTED.  The SARS-CoV-2 RNA is generally detectable in upper respiratoy specimens during the acute phase of infection. The lowest concentration of SARS-CoV-2 viral copies this assay can detect is 131 copies/mL. A negative result does not preclude SARS-Cov-2 infection and should not be used as the sole basis for treatment or other patient management decisions. A negative result may occur with  improper specimen collection/handling, submission of specimen other than nasopharyngeal swab, presence of viral mutation(s) within the areas targeted by this assay, and inadequate number of viral copies (<131 copies/mL). A negative result must be combined with clinical observations,  patient history, and epidemiological information. The expected result is Negative.  Fact Sheet for Patients:  PinkCheek.be  Fact Sheet for Healthcare Providers:  GravelBags.it  This test is no t yet approved or cleared by the [place] FDA and  has been authorized for detection and/or diagnosis of SARS-CoV-2 by FDA under an Emergency Use Authorization (EUA). This EUA will remain  in effect (meaning this test can be used) for the duration of the COVID-19 declaration under Section 564(b)(1) of the Act, 21 U.S.C. section 360bbb-3(b)(1), unless the authorization is terminated or revoked sooner.  Influenza A by PCR NEGATIVE NEGATIVE Final   Influenza B by PCR NEGATIVE NEGATIVE Final    Comment: (NOTE) The Xpert Xpress SARS-CoV-2/FLU/RSV assay is intended as an aid in  the diagnosis of influenza from Nasopharyngeal swab specimens and  should not be used as a sole basis for treatment. Nasal washings and  aspirates are unacceptable for Xpert Xpress SARS-CoV-2/FLU/RSV  testing.  Fact Sheet for Patients: PinkCheek.be  Fact Sheet for Healthcare Providers: GravelBags.it  This test is not yet approved or cleared by the [place] FDA and  has been authorized for detection and/or diagnosis of SARS-CoV-2 by  FDA under an Emergency Use Authorization (EUA). This EUA will remain  in effect (meaning this test can be used) for the duration of the  Covid-19 declaration under Section 564(b)(1) of the Act, 21  U.S.C. section 360bbb-3(b)(1), unless the authorization is  terminated or revoked. Performed at [facility], [address], [place], [place] [address]      Radiology Studies: DG Chest 1 View  Result Date: 08/30/2020 CLINICAL DATA:  Shortness of breath EXAM: CHEST  1 VIEW COMPARISON:  08/28/2020 FINDINGS: Cardiac shadow is stable. Aortic calcifications  are again seen. Lungs are well aerated bilaterally. Central vascular congestion is noted with increasing interstitial change consistent with CHF. No sizable effusion is noted. No focal confluent infiltrate is seen. No bony abnormality is noted. IMPRESSION: Changes consistent with CHF. Electronically Signed   By: Inez Catalina M.D.   On: 08/30/2020 21:48    Scheduled Meds: . amLODipine  5 mg Oral Daily  . aspirin EC  81 mg Oral Daily  . azithromycin  250 mg Oral Daily  . calcium acetate  1,334 mg Oral TID WC  . carvedilol  3.125 mg Oral BID WC  . Chlorhexidine Gluconate Cloth  6 each Topical Daily  . epoetin (EPOGEN/PROCRIT) injection  10,000 Units Intravenous Q T,Th,Sa-HD  . furosemide  80 mg Oral Daily  . ipratropium-albuterol  3 mL Nebulization Q4H WA  . isosorbide mononitrate  30 mg Oral Daily  . lidocaine  1 patch Transdermal Q24H  . mirtazapine  15 mg Oral QHS  . oxybutynin  15 mg Oral Daily  . pravastatin  40 mg Oral QHS  . predniSONE  40 mg Oral Q breakfast  . sertraline  50 mg Oral QHS  . sodium chloride flush  3 mL Intravenous Q12H   Continuous Infusions: . sodium chloride    . heparin 550 Units/hr (08/31/20 0807)     LOS: 3 days   Time spent: 30 minutes.  Desma Maxim, MD [facility]  If 7PM-7AM, please contact night-coverage [email or web address]  09/01/2020, 8:45 AM

## 2020-09-01 NOTE — Progress Notes (Signed)
*  PRELIMINARY RESULTS* Echocardiogram 2D Echocardiogram has been performed.  Phyllis [name] 09/01/2020, 9:45 AM

## 2020-09-02 ENCOUNTER — Other Ambulatory Visit: Payer: Self-pay | Admitting: Cardiovascular Disease

## 2020-09-02 ENCOUNTER — Telehealth: Payer: Self-pay | Admitting: Cardiovascular Disease

## 2020-09-02 DIAGNOSIS — J9621 Acute and chronic respiratory failure with hypoxia: Secondary | ICD-10-CM | POA: Diagnosis not present

## 2020-09-02 DIAGNOSIS — E1122 Type 2 diabetes mellitus with diabetic chronic kidney disease: Secondary | ICD-10-CM

## 2020-09-02 DIAGNOSIS — I214 Non-ST elevation (NSTEMI) myocardial infarction: Secondary | ICD-10-CM | POA: Diagnosis not present

## 2020-09-02 DIAGNOSIS — J810 Acute pulmonary edema: Secondary | ICD-10-CM | POA: Diagnosis not present

## 2020-09-02 LAB — CBC
HCT: 25.1 % — ABNORMAL LOW (ref 36.0–46.0)
Hemoglobin: 8.4 g/dL — ABNORMAL LOW (ref 12.0–15.0)
MCH: 30.1 pg (ref 26.0–34.0)
MCHC: 33.5 g/dL (ref 30.0–36.0)
MCV: 90 fL (ref 80.0–100.0)
Platelets: 134 10*3/uL — ABNORMAL LOW (ref 150–400)
RBC: 2.79 MIL/uL — ABNORMAL LOW (ref 3.87–5.11)
RDW: 15.5 % (ref 11.5–15.5)
WBC: 8.2 10*3/uL (ref 4.0–10.5)
nRBC: 0.2 % (ref 0.0–0.2)

## 2020-09-02 LAB — BASIC METABOLIC PANEL
Anion gap: 12 (ref 5–15)
BUN: 65 mg/dL — ABNORMAL HIGH (ref 8–23)
CO2: 31 mmol/L (ref 22–32)
Calcium: 8.8 mg/dL — ABNORMAL LOW (ref 8.9–10.3)
Chloride: 94 mmol/L — ABNORMAL LOW (ref 98–111)
Creatinine, Ser: 5.51 mg/dL — ABNORMAL HIGH (ref 0.44–1.00)
GFR, Estimated: 7 mL/min — ABNORMAL LOW (ref >60)
Glucose, Bld: 109 mg/dL — ABNORMAL HIGH (ref 70–99)
Potassium: 4.5 mmol/L (ref 3.5–5.1)
Sodium: 137 mmol/L (ref 135–145)

## 2020-09-02 LAB — CULTURE, BLOOD (SINGLE)
Culture: NO GROWTH
Special Requests: ADEQUATE

## 2020-09-02 MED ORDER — IPRATROPIUM-ALBUTEROL 0.5-2.5 (3) MG/3ML IN SOLN
3.0000 mL | Freq: Four times a day (QID) | RESPIRATORY_TRACT | Status: DC
  Administered 2020-09-02 – 2020-09-03 (×7): 3 mL via RESPIRATORY_TRACT
  Filled 2020-09-02 (×2): qty 3
  Filled 2020-09-02: qty 15
  Filled 2020-09-02 (×3): qty 3

## 2020-09-02 MED ORDER — NITROGLYCERIN 0.4 MG SL SUBL: 0.4000 mg | SUBLINGUAL_TABLET | SUBLINGUAL | 3 refills | Status: AC | PRN

## 2020-09-02 MED ORDER — HEPARIN SODIUM (PORCINE) 5000 UNIT/ML IJ SOLN
5000.0000 [IU] | Freq: Three times a day (TID) | INTRAMUSCULAR | Status: DC
  Administered 2020-09-03 (×2): 5000 [IU] via SUBCUTANEOUS
  Filled 2020-09-02 (×2): qty 1

## 2020-09-02 NOTE — Progress Notes (Signed)
Mobility Specialist - Progress Note   09/02/20 1424  Mobility  Activity Contraindicated/medical hold  Mobility performed by Mobility specialist    Per discussion w/ nurse, pt currently in dialysis. Will hold and re-attempt session when pt is available.    Brittnie Lewey Mobility Specialist  09/02/20, 2:25 PM

## 2020-09-02 NOTE — Progress Notes (Signed)
PT Cancellation Note  Patient Details Name: Phyllis [name] MRN: [id number] DOB: [birth year]   Cancelled Treatment:    Reason Eval/Treat Not Completed: Other (comment) PT order received and chart reviewed. Pt currently off the unit to hemodialysis. Will attempt another date/time when pt available.   Vale Haven 09/02/2020, 2:31 PM

## 2020-09-02 NOTE — Progress Notes (Signed)
Progress Note  [name] Date of Encounter: 09/02/2020  Primary Cardiologist: Ida Rogue, MD  Subjective   No c/p or dyspnea last night/this AM.  Slept well. Would like to start getting out of bed.  Due for HD today.  Inpatient Medications    Scheduled Meds: . aspirin EC  81 mg Oral Daily  . calcium acetate  1,334 mg Oral TID WC  . carvedilol  3.125 mg Oral BID WC  . Chlorhexidine Gluconate Cloth  6 each Topical Daily  . epoetin (EPOGEN/PROCRIT) injection  10,000 Units Intravenous Q T,Th,Sa-HD  . furosemide  80 mg Oral Daily  . ipratropium-albuterol  3 mL Nebulization QID  . isosorbide mononitrate  60 mg Oral Daily  . lidocaine  1 patch Transdermal Q24H  . mirtazapine  15 mg Oral QHS  . oxybutynin  15 mg Oral Daily  . pravastatin  40 mg Oral QHS  . predniSONE  40 mg Oral Q breakfast  . sertraline  50 mg Oral QHS  . sodium chloride flush  3 mL Intravenous Q12H   Continuous Infusions: . sodium chloride     PRN Meds: sodium chloride, acetaminophen, albuterol, dextromethorphan-guaiFENesin, hydrALAZINE, HYDROcodone-acetaminophen, hydrOXYzine, lidocaine-prilocaine, nitroGLYCERIN, ondansetron (ZOFRAN) IV, sodium chloride flush   Vital Signs    Vitals:   09/02/20 0300 09/02/20 0400 09/02/20 0434 09/02/20 0500  BP:   (!) 132/54   Pulse:   73   Resp: 18 17  18   Temp:   98.7 F (37.1 C)   TempSrc:   Oral   SpO2:   97%   Weight:   61.6 kg   Height:        Intake/Output Summary (Last 24 hours) at 09/02/2020 0802 Last data filed at 09/01/2020 1900 Gross per 24 hour  Intake 300 ml  Output --  Net 300 ml   Filed Weights   08/31/20 0500 09/01/20 2105 09/02/20 0434  Weight: 64.2 kg 60.8 kg 61.6 kg    Physical Exam   GEN: Well nourished, well developed, in no acute distress.  HEENT: Grossly normal.  Neck: Supple, no JVD, carotid bruits, or masses. Cardiac: RRR, 3/6 syst murmur @ upper sternal borders, no rubs, or gallops. No clubbing,  cyanosis, edema.  Radials/DP/PT 2+ and equal bilaterally.  Respiratory:  Respirations regular and unlabored, bibasilar crackles. GI: Soft, nontender, nondistended, BS + x 4. MS: no deformity or atrophy. Skin: warm and dry, no rash. Neuro:  Strength and sensation are intact. Psych: AAOx3.  Normal affect.  Labs    Chemistry Recent Labs  Lab 08/28/20 [phone] 08/29/20 0440 08/31/20 0451 09/01/20 0333 09/02/20 0552  NA 130*   < > 139 140 137  K 4.8   < > 4.9 4.1 4.5  CL 90*   < > 97* 95* 94*  CO2 25   < > 31 31 31   GLUCOSE 182*   < > 125* 104* 109*  BUN 66*   < > 30* 29* 65*  CREATININE 7.00*   < > 3.92* 3.35* 5.51*  CALCIUM 8.5*   < > 9.0 9.2 8.8*  PROT 7.0  --   --   --   --   ALBUMIN 4.1  --   --   --   --   AST 17  --   --   --   --   ALT 12  --   --   --   --   ALKPHOS 118  --   --   --   --  BILITOT 0.8  --   --   --   --   GFRNONAA 5*   < > 11* 13* 7*  ANIONGAP 15   < > 11 14 12    < > = values in this interval not displayed.     Hematology Recent Labs  Lab 08/31/20 0451 09/01/20 0333 09/02/20 0552  WBC 14.2* 8.0 8.2  RBC 2.63* 2.90* 2.79*  HGB 7.8* 8.7* 8.4*  HCT 23.5* 26.0* 25.1*  MCV 89.4 89.7 90.0  MCH 29.7 30.0 30.1  MCHC 33.2 33.5 33.5  RDW 16.4* 16.1* 15.5  PLT 175 150 134*    Cardiac Enzymes  Recent Labs  Lab 08/30/20 0200 08/30/20 0441 08/30/20 2216 08/31/20 0119 08/31/20 1239  TROPONINIHS 706* 1,148* 1,497* 1,186* 7,200*      BNP Recent Labs  Lab 08/28/20 0959 08/29/20 0440 08/30/20 2216  BNP 697.1* 948.0* 1,601.9*     Lipids  Lab Results  Component Value Date   CHOL 180 01/17/2018   HDL 48 01/17/2018   LDLCALC 101 (H) 01/17/2018   TRIG 154 (H) 01/17/2018   CHOLHDL 3.8 01/17/2018    HbA1c  Lab Results  Component Value Date   HGBA1C 5.1 03/30/2020    Radiology    DG Chest 1 View  Result Date: 08/30/2020 CLINICAL DATA:  Shortness of breath EXAM: CHEST  1 VIEW COMPARISON:  08/28/2020 FINDINGS: Cardiac shadow is  stable. Aortic calcifications are again seen. Lungs are well aerated bilaterally. Central vascular congestion is noted with increasing interstitial change consistent with CHF. No sizable effusion is noted. No focal confluent infiltrate is seen. No bony abnormality is noted. IMPRESSION: Changes consistent with CHF. Electronically Signed   By: Inez Catalina M.D.   On: 08/30/2020 21:48   Telemetry    RSR - Personally Reviewed  Cardiac Studies   Cardiac Catheterization11.2020   Severe, calcified ostial to distal left main, 80%.  Severe ostial LAD, 99% followed by an eccentric 50 to 70% proximal LAD after the first diagonal.  Ostial to proximal 60% circumflex. The second obtuse marginal contains segmental 60 to 70% narrowing. The circumflex is dominant.  Nondominant RCA.  Moderately severe to severe aortic stenosis with valve area of 0.99 cm, peak to peak gradient 20 mmHg, mean gradient 28 mmHg, based upon cardiac output of 7.2 L/min and corresponding cardiac index of 4.83 L/min/m  Severe ascending and descending aortic calcification  RECOMMENDATIONS   Heart team approach: Determine if patient has surgical options with SAVR and CABG. Consider extremely high risk PCI which would require hemodynamic support. Consider palliative care given end-stage kidney disease on dialysis, severe anemia, and poor long-term prognosis even if stent therapy is successful.  Discussed with Dr. Burt Knack. _____________  2D Echocardiogram6.2021  1. Left ventricular ejection fraction, by estimation, is 60 to 65%. The  left ventricle has normal function. The left ventricle has no regional  wall motion abnormalities. Left ventricular diastolic parameters are  consistent with Grade I diastolic  dysfunction (impaired relaxation). Elevated left atrial pressure.  2. Right ventricular systolic function is normal. The right ventricular  size is normal. Tricuspid regurgitation signal is inadequate for  assessing  PA pressure.  3. Left atrial size was mildly dilated.  4. Moderately elevated transmitral gradient is noted, most likely due to  bulky annular calcification. The mitral valve was not well visualized.  Trivial mitral valve regurgitation.  5. The aortic valve has an indeterminant number of cusps. Aortic valve  regurgitation is trivial. Moderate to severe aortic  valve stenosis. Aortic  valve mean gradient measures 23.0 mmHg. Aortic valve Vmax measures 3.33  m/s.  _____________   Patient Profile     74 y.o.femalewith a hx of chronic chest pain,severe aortic stenosis, ESRD on HD TTS(HD 3 days per wek), recurrent flash pulmonary edema, recent respiratory/PEA arrest11/2020, anemia of chronic disease,diffuseaortic atherosclerosis, anxiety, DM2, hypertension, hyperlipidemia, obesity, asthma, COPD, OSA, HFpEF,stroke, GERD, gout, physical deconditioning,who was admitted 10/11 w/ NSTEMI and chest pain.  Assessment & Plan    1.  NSTEMI/CAD: Patient known severe, multivessel CAD status post catheterization November 2020.  Admitted October 11 with chest pain.  Had recurrent chest pain on the evening of the 11th with dyspnea requiring BiPAP.  Troponin subsequently rose to 7200 yesterday afternoon.  She had one episode of nitrate responsive chest pain last night.  Her films were again reviewed by our interventional team at [facility] and she is not felt to be a suitable candidate for PCI given complex anatomy and severe aortic stenosis.  Continued medical therapy is again recommended.  She remains on asa/statin/ blocker. CCB d/c'd 10/13 to make additional room for nitrate titration - now on imdur 60 daily.  No c/p or dyspnea last night or this AM - first time in 3 nights.  Cont current meds.  Could consider ranexa for recurrent c/p.  As prev noted, she understands that she is likely to have angina, and that the primary goal is limiting Ss and preventing MI.  She would be open to a second  opinion @ [facility] as outpt is Ss are uncontrolled.  2.  Sev Aortic Stenosis:  Not felt to be suitable SAVR/TAVR candidate.  Conservative mgmt as above.  3.  ESRD:  HD per nephrology.  4.  Essential HTN:  Stable.  5.  HL:  Cont statin rx.  6.  DMII:  Per IM.  7.  Anemia of chronic dzs:  H/H relatively stable s/p PRBCs x 1 on 10/12 (8.4/25.1).  Likely contributing to anginal Ss.  8.  Acute hypoxic resp failure/COPD exac:  Steroids/inhalers per IM.  Signed, Murray Hodgkins, NP  09/02/2020, 8:02 AM    For questions or updates, please contact   Please consult [email or web address] for contact info under Cardiology/STEMI.

## 2020-09-02 NOTE — Progress Notes (Signed)
[facility]  ROUNDING NOTE   Subjective:   Planning for dialysis today,orders placed. Patient denies worsening SOB, on 2L of supplemental O2 via [place].   HEMODIALYSIS FLOWSHEET:  Blood Flow Rate (mL/min): 200 mL/min Arterial Pressure (mmHg): -210 mmHg Venous Pressure (mmHg): 120 mmHg Transmembrane Pressure (mmHg): 60 mmHg Ultrafiltration Rate (mL/min): 670 mL/min Dialysate Flow Rate (mL/min): 600 ml/min Conductivity: Machine : 14.1 Conductivity: Machine : 14.1 Dialysis Fluid Bolus: Normal Saline Bolus Amount (mL): 300 mL      Objective:  Vital signs in last 24 hours:  Temp:  [98.4 F (36.9 C)-100.3 F (37.9 C)] 100.3 F (37.9 C) (10/14 1207) Pulse Rate:  [73-96] 81 (10/14 1207) Resp:  [16-25] 18 (10/14 1207) BP: (107-140)/(42-72) 126/47 (10/14 1207) SpO2:  [91 %-100 %] 100 % (10/14 1213) Weight:  [60.8 kg-61.6 kg] 61.6 kg (10/14 0434)  Weight change:  Filed Weights   08/31/20 0500 09/01/20 2105 09/02/20 0434  Weight: 64.2 kg 60.8 kg 61.6 kg    Intake/Output: I/O last 3 completed shifts: In: 1185 [P.O.:425; Blood:760] Out: -    Intake/Output this shift:  Total I/O In: 120 [P.O.:120] Out: -   Physical Exam: General: In no acute distress  Head: Normocephalic, atraumatic. Moist oral mucosal membranes  Eyes: Sclerae and conjunctivae clear  Neck: Supple, trachea at midline  Lungs:  Respirations even, unlabored, bibasilar crackles +  Heart: S1S2, +murmur,Regular  Abdomen:  Soft, nontender, non distended  Extremities: No peripheral edema.  Neurologic: Alert,oriented x 3  Skin: No acute  Lesions or rashes  Access: Left AVF +thrill,+ bruit    Basic Metabolic Panel: Recent Labs  Lab 08/29/20 0440 08/29/20 0440 08/30/20 0441 08/30/20 0441 08/31/20 0451 09/01/20 0333 09/02/20 0552  NA 136  --  137  --  139 140 137  K 5.2*  --  5.2*  --  4.9 4.1 4.5  CL 93*  --  92*  --  97* 95* 94*  CO2 27  --  29  --  31 31 31   GLUCOSE 158*  --  114*  --   125* 104* 109*  BUN 36*  --  62*  --  30* 29* 65*  CREATININE 4.86*  --  6.49*  --  3.92* 3.35* 5.51*  CALCIUM 8.6*   < > 9.1   < > 9.0 9.2 8.8*  MG  --   --  2.8*  --  2.1 2.2  --    < > = values in this interval not displayed.    Liver Function Tests: Recent Labs  Lab 08/28/20 0958  AST 17  ALT 12  ALKPHOS 118  BILITOT 0.8  PROT 7.0  ALBUMIN 4.1   No results for input(s): LIPASE, AMYLASE in the last 168 hours. No results for input(s): AMMONIA in the last 168 hours.  CBC: Recent Labs  Lab 08/28/20 0958 08/29/20 0440 08/30/20 0441 08/30/20 1003 08/31/20 0451 09/01/20 0333 09/02/20 0552  WBC 12.2*   < > 13.5* 16.1* 14.2* 8.0 8.2  NEUTROABS 9.3*  --   --   --   --   --   --   HGB 10.1*   < > 8.3* 8.2* 7.8* 8.7* 8.4*  HCT 30.7*   < > 25.7* 25.1* 23.5* 26.0* 25.1*  MCV 89.0   < > 92.1 90.9 89.4 89.7 90.0  PLT 208   < > 169 190 175 150 134*   < > = values in this interval not displayed.    Cardiac Enzymes:  No results for input(s): CKTOTAL, CKMB, CKMBINDEX, TROPONINI in the last 168 hours.  BNP: Invalid input(s): POCBNP  CBG: Recent Labs  Lab 08/28/20 1953 08/30/20 2129 09/01/20 0722 09/01/20 1140  GLUCAP 205* 199* 45 119*    Microbiology: Results for orders placed or performed during the hospital encounter of 08/28/20  Blood culture (routine single)     Status: None   Collection Time: 08/28/20  9:59 AM   Specimen: BLOOD  Result Value Ref Range Status   Specimen Description BLOOD RAC  Final   Special Requests   Final    BOTTLES DRAWN AEROBIC AND ANAEROBIC Blood Culture adequate volume   Culture   Final    NO GROWTH 5 DAYS Performed at [facility], [address] [place], [place] [address]    Report Status 09/02/2020 FINAL  Final  Respiratory Panel by RT PCR (Flu A&B, Covid) - Nasopharyngeal Swab     Status: None   Collection Time: 08/28/20  9:59 AM   Specimen: Nasopharyngeal Swab  Result Value Ref Range Status   SARS Coronavirus 2 by RT  PCR NEGATIVE NEGATIVE Final    Comment: (NOTE) SARS-CoV-2 target nucleic acids are NOT DETECTED.  The SARS-CoV-2 RNA is generally detectable in upper respiratoy specimens during the acute phase of infection. The lowest concentration of SARS-CoV-2 viral copies this assay can detect is 131 copies/mL. A negative result does not preclude SARS-Cov-2 infection and should not be used as the sole basis for treatment or other patient management decisions. A negative result may occur with  improper specimen collection/handling, submission of specimen other than nasopharyngeal swab, presence of viral mutation(s) within the areas targeted by this assay, and inadequate number of viral copies (<131 copies/mL). A negative result must be combined with clinical observations, patient history, and epidemiological information. The expected result is Negative.  Fact Sheet for Patients:  PinkCheek.be  Fact Sheet for Healthcare Providers:  GravelBags.it  This test is no t yet approved or cleared by the [place] FDA and  has been authorized for detection and/or diagnosis of SARS-CoV-2 by FDA under an Emergency Use Authorization (EUA). This EUA will remain  in effect (meaning this test can be used) for the duration of the COVID-19 declaration under Section 564(b)(1) of the Act, 21 U.S.C. section 360bbb-3(b)(1), unless the authorization is terminated or revoked sooner.     Influenza A by PCR NEGATIVE NEGATIVE Final   Influenza B by PCR NEGATIVE NEGATIVE Final    Comment: (NOTE) The Xpert Xpress SARS-CoV-2/FLU/RSV assay is intended as an aid in  the diagnosis of influenza from Nasopharyngeal swab specimens and  should not be used as a sole basis for treatment. Nasal washings and  aspirates are unacceptable for Xpert Xpress SARS-CoV-2/FLU/RSV  testing.  Fact Sheet for Patients: PinkCheek.be  Fact Sheet for  Healthcare Providers: GravelBags.it  This test is not yet approved or cleared by the [place] FDA and  has been authorized for detection and/or diagnosis of SARS-CoV-2 by  FDA under an Emergency Use Authorization (EUA). This EUA will remain  in effect (meaning this test can be used) for the duration of the  Covid-19 declaration under Section 564(b)(1) of the Act, 21  U.S.C. section 360bbb-3(b)(1), unless the authorization is  terminated or revoked. Performed at [facility], [address] [place], [place] [address]     Coagulation Studies: No results for input(s): LABPROT, INR in the last 72 hours.  Urinalysis: No results for input(s): COLORURINE, LABSPEC, [place], [place], [place],  BILIRUBINUR, KETONESUR, PROTEINUR, UROBILINOGEN, NITRITE, LEUKOCYTESUR in the last 72 hours.  Invalid input(s): APPERANCEUR    Imaging: ECHOCARDIOGRAM COMPLETE  Result Date: 09/01/2020    ECHOCARDIOGRAM REPORT   Patient Name:   DORAL DIGANGI Kurtzman Date of Exam: 09/01/2020 Medical Rec #:  [id number]           Height:       58.0 in Accession #:    [id number]          Weight:       141.5 lb Date of Birth:  06/03/1946            BSA:          1.572 m Patient Age:    24 years            BP:           125/97 mmHg Patient Gender: F                   HR:           94 bpm. Exam Location:  [facility] Procedure: 2D Echo, Color Doppler and Cardiac Doppler Indications:     NSTEMI  History:         Patient has prior history of Echocardiogram examinations, most                  recent 04/20/2020. Risk Factors:Diabetes and Hypertension.  Sonographer:     Sherrie Sport RDCS (AE) Referring Phys:  [phone] CHRISTOPHER END Diagnosing Phys: Kate Sable MD  Sonographer Comments: Suboptimal apical window. IMPRESSIONS  1. Left ventricular ejection fraction, by estimation, is 55 to 60%. The left ventricle has normal function. The left ventricle has no regional wall motion abnormalities. There is mild  left ventricular hypertrophy. Left ventricular diastolic parameters are consistent with Grade II diastolic dysfunction (pseudonormalization).  2. Right ventricular systolic function is normal. The right ventricular size is normal.  3. Left atrial size was moderately dilated.  4. Right atrial size was mildly dilated.  5. The mitral valve is grossly normal. Mild mitral valve regurgitation.  6. Aortic valve area 0.8cm2, DVI 0.25, Vmax 2.24m/s, Mean gradient 32mmHg. The aortic valve was not well visualized. Aortic valve regurgitation is not visualized. Moderate to severe aortic valve stenosis. FINDINGS  Left Ventricle: Left ventricular ejection fraction, by estimation, is 55 to 60%. The left ventricle has normal function. The left ventricle has no regional wall motion abnormalities. The left ventricular internal cavity size was normal in size. There is  mild left ventricular hypertrophy. Left ventricular diastolic parameters are consistent with Grade II diastolic dysfunction (pseudonormalization). Right Ventricle: The right ventricular size is normal. No increase in right ventricular wall thickness. Right ventricular systolic function is normal. Left Atrium: Left atrial size was moderately dilated. Right Atrium: Right atrial size was mildly dilated. Pericardium: There is no evidence of pericardial effusion. Mitral Valve: The mitral valve is grossly normal. Mild mitral valve regurgitation. MV peak gradient, 10.9 mmHg. The mean mitral valve gradient is 4.0 mmHg. Tricuspid Valve: The tricuspid valve is not well visualized. Tricuspid valve regurgitation is not demonstrated. Aortic Valve: Aortic valve area 0.8cm2, DVI 0.25, Vmax 2.3m/s, Mean gradient 53mmHg. The aortic valve was not well visualized. Aortic valve regurgitation is not visualized. Moderate to severe aortic stenosis is present. Aortic valve mean gradient measures 19.0 mmHg. Aortic valve peak gradient measures 31.7 mmHg. Aortic valve area, by VTI measures 0.85  cm. Pulmonic Valve: The pulmonic valve was not well visualized. Pulmonic  valve regurgitation is not visualized. Aorta: The aortic root is normal in size and structure. Venous: The inferior vena cava was not well visualized. IAS/Shunts: No atrial level shunt detected by color flow Doppler.  LEFT VENTRICLE PLAX 2D LVIDd:         4.27 cm  Diastology LVIDs:         3.14 cm  LV e' medial:    3.70 cm/s LV PW:         1.14 cm  LV E/e' medial:  37.6 LV IVS:        0.99 cm  LV e' lateral:   3.81 cm/s LVOT diam:     2.00 cm  LV E/e' lateral: 36.5 LV SV:         50 LV SV Index:   32 LVOT Area:     3.14 cm  RIGHT VENTRICLE RV Basal diam:  3.13 cm RV S prime:     13.50 cm/s TAPSE (M-mode): 3.3 cm LEFT ATRIUM           Index       RIGHT ATRIUM           Index LA diam:      3.40 cm 2.16 cm/m  RA Area:     18.50 cm LA Vol (A4C): 64.3 ml 40.90 ml/m RA Volume:   53.00 ml  33.71 ml/m  AORTIC VALVE                    PULMONIC VALVE AV Area (Vmax):    0.69 cm     PV Vmax:        0.54 m/s AV Area (Vmean):   0.67 cm     PV Peak grad:   1.1 mmHg AV Area (VTI):     0.85 cm     RVOT Peak grad: 4 mmHg AV Vmax:           281.67 cm/s AV Vmean:          206.000 cm/s AV VTI:            0.586 m AV Peak Grad:      31.7 mmHg AV Mean Grad:      19.0 mmHg LVOT Vmax:         62.00 cm/s LVOT Vmean:        43.900 cm/s LVOT VTI:          0.158 m LVOT/AV VTI ratio: 0.27  AORTA Ao Root diam: 2.70 cm MITRAL VALVE MV Area (PHT): 3.99 cm     SHUNTS MV Peak grad:  10.9 mmHg    Systemic VTI:  0.16 m MV Mean grad:  4.0 mmHg     Systemic Diam: 2.00 cm MV Vmax:       1.65 m/s MV Vmean:      95.2 cm/s MV Decel Time: 190 msec MV E velocity: 139.00 cm/s MV A velocity: 118.00 cm/s MV E/A ratio:  1.18 Kate Sable MD Electronically signed by Kate Sable MD Signature Date/Time: 09/01/2020/6:06:18 PM    Final      Medications:   . sodium chloride     . aspirin EC  81 mg Oral Daily  . calcium acetate  1,334 mg Oral TID WC  . carvedilol  3.125  mg Oral BID WC  . Chlorhexidine Gluconate Cloth  6 each Topical Daily  . epoetin (EPOGEN/PROCRIT) injection  10,000 Units Intravenous Q T,Th,Sa-HD  . furosemide  80 mg Oral Daily  . ipratropium-albuterol  3 mL Nebulization QID  . isosorbide mononitrate  60 mg Oral Daily  . lidocaine  1 patch Transdermal Q24H  . mirtazapine  15 mg Oral QHS  . oxybutynin  15 mg Oral Daily  . pravastatin  40 mg Oral QHS  . predniSONE  40 mg Oral Q breakfast  . sertraline  50 mg Oral QHS  . sodium chloride flush  3 mL Intravenous Q12H   sodium chloride, acetaminophen, albuterol, dextromethorphan-guaiFENesin, hydrALAZINE, HYDROcodone-acetaminophen, hydrOXYzine, lidocaine-prilocaine, nitroGLYCERIN, ondansetron (ZOFRAN) IV, sodium chloride flush  Assessment/ Plan:  Ms. CARINA CHAPLIN is a 74 y.o. white female with end stage renal disease on hemodialysis, hypertension, aortic stenosis, COPD, diabetes mellitus type II, chronic costochrondritis who was admitted to [facility] on 08/28/2020 for Flash pulmonary edema ([place]) [J81.0] Hypoxia [R09.02] Acute on chronic respiratory failure with hypoxia ([place]) [J96.21] Acute respiratory failure with hypoxia ([place]) [J96.01]  [facility] TTS [place]. Left AVF 60kg  1. End Stage Renal Disease with hyperkalemia: emergent hemodialysis treatment on admission.  Seems patient has not been getting to her dry weight as an outpatient. Leaving at [place].   Planning for dialysis today Orders placed Will continue TTS schedule   2. Hypertension: BP readings within acceptable range - On Carvedilol,Furosemide,Imdur and PRN Hydralazine   3. Anemia with chronic kidney disease:  -Hgb 8.4 today - Epogen 10,000 units with dialysis TTS  4. Secondary Hyperparathyroidism:  -Calcium 8.8 - [place] with meals TID   LOS: 4 [place] 10/14/20211:22 PM

## 2020-09-02 NOTE — Telephone Encounter (Signed)
fyi :  Patient called to schedule hospital fu visit.    Patient declined to see any APP she "doesn't like them" " Dr. Rockey Situ is the best"  Scheduled next available 12/6  Added to waitlist

## 2020-09-02 NOTE — Care Management Important Message (Signed)
Important Message  Patient Details  Name: Phyllis [name] MRN: [id number] Date of Birth: [birth year]   Medicare Important Message Given:  Yes     [name] 09/02/2020, 2:23 PM

## 2020-09-02 NOTE — Progress Notes (Signed)
PROGRESS NOTE    CHOSEN GARRON  ZOX:[id number] DOB: [birth year] DOA: 08/28/2020 PCP: [name], MD   Brief Narrative: Taken from H&P and prior notes. Jaira Canady Crutchfieldis a 74 y.o.femalewith medical history significant ofESRD-HD (TTS), hypertension, hyperlipidemia, diet-controlled diabetes, COPD, asthma, stroke, GERD, gout, depression with anxiety, severe aortic stenosis, OSA, CHF, anemia, who presented with shortness of breath. Patient also developed some chest pain and elevated troponin.  Multiple comorbidities.  She is not a candidate for further ischemic work-up all any other procedure per cardiology. See cardiology PA note for the summary of her recurrent hospitalizations and comorbidities. Palliative care was consulted 10/12.  Subjective: Says feels back to baseline. Breathing at baseline, no chest pain, has normal appetite.    Assessment & Plan:   Principal Problem:   Acute on chronic respiratory failure with hypoxia (HCC) Active Problems:   ESRD (end stage renal disease) on dialysis (HCC)   Hyperlipidemia   CVA (cerebral vascular accident) ([place])   Chronic diastolic heart failure (HCC)   HTN (hypertension)   Aortic valve stenosis   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-stage renal disease) (HCC)   Flash pulmonary edema (HCC)   COPD exacerbation (HCC)   Depression with anxiety   Type II diabetes mellitus with renal manifestations ([place])   Acute respiratory failure with hypoxia (HCC)  NSTEMI in setting of known multi-vessel CAD and moderate to severe aortic stenosis.  Cardiology was consulted with concern of NSTEMI.  Patient also has an history of chronic costochondritis.  Troponin peaked at 1497, not trending down. Required bipap night of 10/11. Per cardiology note patient has multiple comorbidities along with a prior PEA arrest requiring resuscitation.  She is not a candidate for any further ischemic work-up or intervention.  Per cardiology note her severe aortic stenosis is  very high risk for any procedure or even TAVR but they will ask their specialist again to review her slides again as she is very Administrator, arts for aggressive care. - They were recommending palliative care consult which was performed yesterday. Patient affirmed wants aggressive ongoing treatment -Continue with medical management. Off amlod due to low BPs. Continuing aspirin statin and carvedilol and imdur added by cardiology. Losartan also on hold.  - now of heparin gtt - appears to be back to baseline, will get dialysis today, if time after will discharge otherwise tomorrow  Acute hypoxic respiratory failure/COPD exacerbation.  Required bipap, now off and breathing comfortably. Patient has multiple hospital visits with similar symptoms.  Most likely secondary to volume overload.  Symptoms improved with dialysis. She is saturating well on 2 L of oxygen -Continue with chest PT. -Continue with incentive spirometry and flutter valve. -Solu-Medrol switched to prednisone 40 mg daily for another 1-2 days (steroids 10/9- -s/p 5 days azithromycin (10/9-10/13). -Continue with DuoNeb.  Severe aortic stenosis.  Not a candidate for any surgical intervention or TAVR. -Continue with medical management.  ESRD (end stage renal disease) on dialysis ([place]).  Patient normally had dialysis Tuesday, Thursday and Saturday we then added dialysis every other week on Monday. -Continue dialysis per nephrology protocol and her schedule.  Hyperkalemia.  Resolved -Going for dialysis today. -Losartan was discontinued per nephrology.  Hyperlipidemia -cont home statin  CVA (cerebral vascular accident) ([place]) -Continue aspirin, pravastatin  Chronic diastolic heart failure ([place]): 2D echo on 04/20/2020 showed EF 60-65% with grade 1 diastolic dysfunction.  -Volume management per renal by dialysis -cont home Lasix 80 mg daily  HTN (hypertension) -IV hydralazine as needed -Continue home Coreg, hydralazine, and  Lasix -d/c  losartan, per nephrology  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-stage renal disease) ([place]):  -s/p 1 unit of PRBC at cardiologist's request as they want to keep hemoglobin above 8. H stable - f/u stool guaiac -EPO with HD  Chronic pain on chronic opioids Rx confirmed on PDMP -continue home Norco PRN  Depression with anxiety --Pt reported significant anxiety which causes dyspnea PLAN: -continue home Remeron and Sertraline. -Continue Atarax PRN for anxiety -avoid benzo since already on opioids  Diet controlledType II diabetes mellitus with renal manifestations ([place]): Recent A1c 5.1, well controlled. Patient is not taking medications at home. -no need for fingersticks.  Objective: Vitals:   09/02/20 1431 09/02/20 1435 09/02/20 1440 09/02/20 1515  BP: 132/60 120/62 97/65 (!) 139/54  Pulse: 83 84 89   Resp: 16 (!) 22 19   Temp:      TempSrc:      SpO2: 100% 100% 100%   Weight:      Height:        Intake/Output Summary (Last 24 hours) at 09/02/2020 1554 Last data filed at 09/02/2020 0930 Gross per 24 hour  Intake 270 ml  Output --  Net 270 ml   Filed Weights   08/31/20 0500 09/01/20 2105 09/02/20 0434  Weight: 64.2 kg 60.8 kg 61.6 kg    Examination:  General.  Well-developed lady, in no acute distress. Pulmonary.  Bilateral basal rales, normal respiratory effort. CV.  Regular rate and rhythm, no JVD, moderate systolic murmur Abdomen.  Soft, obese nontender, nondistended, BS positive. CNS.  Alert and oriented x3.  No focal neurologic deficit. Extremities.  No edema, no cyanosis, pulses intact and symmetrical. Psychiatry.  Judgment and insight appears normal.   DVT prophylaxis: Heparin  Code Status: Full Family Communication: none at bedside Disposition Plan:  Status is: Inpatient  Remains inpatient appropriate because:Inpatient level of care appropriate due to severity of illness   Dispo: The patient is from: Home              Anticipated d/c is to: Home               Anticipated d/c date is: 1 day              Patient currently is not medically stable to d/c.  Consultants:   Cardiology  Nephrology  Palliative care  Procedures:  Antimicrobials: azithromycin 10/9 - 10/13  Data Reviewed: I have personally reviewed following labs and imaging studies  CBC: Recent Labs  Lab 08/28/20 0958 08/29/20 0440 08/30/20 0441 08/30/20 1003 08/31/20 0451 09/01/20 0333 09/02/20 0552  WBC 12.2*   < > 13.5* 16.1* 14.2* 8.0 8.2  NEUTROABS 9.3*  --   --   --   --   --   --   HGB 10.1*   < > 8.3* 8.2* 7.8* 8.7* 8.4*  HCT 30.7*   < > 25.7* 25.1* 23.5* 26.0* 25.1*  MCV 89.0   < > 92.1 90.9 89.4 89.7 90.0  PLT 208   < > 169 190 175 150 134*   < > = values in this interval not displayed.   Basic Metabolic Panel: Recent Labs  Lab 08/29/20 0440 08/30/20 0441 08/31/20 0451 09/01/20 0333 09/02/20 0552  NA 136 137 139 140 137  K 5.2* 5.2* 4.9 4.1 4.5  CL 93* 92* 97* 95* 94*  CO2 27 29 31 31 31   GLUCOSE 158* 114* 125* 104* 109*  BUN 36* 62* 30* 29* 65*  CREATININE 4.86* 6.49* 3.92*  3.35* 5.51*  CALCIUM 8.6* 9.1 9.0 9.2 8.8*  MG  --  2.8* 2.1 2.2  --    GFR: Estimated Creatinine Clearance: 7 mL/min (A) (by C-G formula based on SCr of 5.51 mg/dL (H)). Liver Function Tests: Recent Labs  Lab 08/28/20 0958  AST 17  ALT 12  ALKPHOS 118  BILITOT 0.8  PROT 7.0  ALBUMIN 4.1   No results for input(s): LIPASE, AMYLASE in the last 168 hours. No results for input(s): AMMONIA in the last 168 hours. Coagulation Profile: Recent Labs  Lab 08/28/20 0958  INR 1.0   Cardiac Enzymes: No results for input(s): CKTOTAL, CKMB, CKMBINDEX, TROPONINI in the last 168 hours. BNP (last 3 results) No results for input(s): PROBNP in the last 8760 hours. HbA1C: No results for input(s): HGBA1C in the last 72 hours. CBG: Recent Labs  Lab 08/28/20 1953 08/30/20 2129 09/01/20 0722 09/01/20 1140  GLUCAP 205* 199* 86 119*   Lipid Profile: No results for input(s):  CHOL, HDL, LDLCALC, TRIG, CHOLHDL, LDLDIRECT in the last 72 hours. Thyroid Function Tests: No results for input(s): TSH, T4TOTAL, FREET4, T3FREE, THYROIDAB in the last 72 hours. Anemia Panel: No results for input(s): VITAMINB12, FOLATE, FERRITIN, TIBC, IRON, RETICCTPCT in the last 72 hours. Sepsis Labs: Recent Labs  Lab 08/28/20 0958 08/28/20 0959  PROCALCITON 0.24  --   LATICACIDVEN  --  1.8    Recent Results (from the past 240 hour(s))  Blood culture (routine single)     Status: None   Collection Time: 08/28/20  9:59 AM   Specimen: BLOOD  Result Value Ref Range Status   Specimen Description BLOOD RAC  Final   Special Requests   Final    BOTTLES DRAWN AEROBIC AND ANAEROBIC Blood Culture adequate volume   Culture   Final    NO GROWTH 5 DAYS Performed at [facility], [address], [place], [place] [address]    Report Status 09/02/2020 FINAL  Final  Respiratory Panel by RT PCR (Flu A&B, Covid) - Nasopharyngeal Swab     Status: None   Collection Time: 08/28/20  9:59 AM   Specimen: Nasopharyngeal Swab  Result Value Ref Range Status   SARS Coronavirus 2 by RT PCR NEGATIVE NEGATIVE Final    Comment: (NOTE) SARS-CoV-2 target nucleic acids are NOT DETECTED.  The SARS-CoV-2 RNA is generally detectable in upper respiratoy specimens during the acute phase of infection. The lowest concentration of SARS-CoV-2 viral copies this assay can detect is 131 copies/mL. A negative result does not preclude SARS-Cov-2 infection and should not be used as the sole basis for treatment or other patient management decisions. A negative result may occur with  improper specimen collection/handling, submission of specimen other than nasopharyngeal swab, presence of viral mutation(s) within the areas targeted by this assay, and inadequate number of viral copies (<131 copies/mL). A negative result must be combined with clinical observations, patient history, and epidemiological information.  The expected result is Negative.  Fact Sheet for Patients:  PinkCheek.be  Fact Sheet for Healthcare Providers:  GravelBags.it  This test is no t yet approved or cleared by the [place] FDA and  has been authorized for detection and/or diagnosis of SARS-CoV-2 by FDA under an Emergency Use Authorization (EUA). This EUA will remain  in effect (meaning this test can be used) for the duration of the COVID-19 declaration under Section 564(b)(1) of the Act, 21 U.S.C. section 360bbb-3(b)(1), unless the authorization is terminated or revoked sooner.     Influenza  A by PCR NEGATIVE NEGATIVE Final   Influenza B by PCR NEGATIVE NEGATIVE Final    Comment: (NOTE) The Xpert Xpress SARS-CoV-2/FLU/RSV assay is intended as an aid in  the diagnosis of influenza from Nasopharyngeal swab specimens and  should not be used as a sole basis for treatment. Nasal washings and  aspirates are unacceptable for Xpert Xpress SARS-CoV-2/FLU/RSV  testing.  Fact Sheet for Patients: PinkCheek.be  Fact Sheet for Healthcare Providers: GravelBags.it  This test is not yet approved or cleared by the [place] FDA and  has been authorized for detection and/or diagnosis of SARS-CoV-2 by  FDA under an Emergency Use Authorization (EUA). This EUA will remain  in effect (meaning this test can be used) for the duration of the  Covid-19 declaration under Section 564(b)(1) of the Act, 21  U.S.C. section 360bbb-3(b)(1), unless the authorization is  terminated or revoked. Performed at [facility], [address], [place], [place] [address]      Radiology Studies: ECHOCARDIOGRAM COMPLETE  Result Date: 09/01/2020    ECHOCARDIOGRAM REPORT   Patient Name:   NUHA DEGNER Alcorn Date of Exam: 09/01/2020 Medical Rec #:  [id number]           Height:       58.0 in Accession #:    [id number]           Weight:       141.5 lb Date of Birth:  Sep 24, 1946            BSA:          1.572 m Patient Age:    9 years            BP:           125/97 mmHg Patient Gender: F                   HR:           94 bpm. Exam Location:  [facility] Procedure: 2D Echo, Color Doppler and Cardiac Doppler Indications:     NSTEMI  History:         Patient has prior history of Echocardiogram examinations, most                  recent 04/20/2020. Risk Factors:Diabetes and Hypertension.  Sonographer:     Sherrie Sport RDCS (AE) Referring Phys:  [phone] CHRISTOPHER END Diagnosing Phys: Kate Sable MD  Sonographer Comments: Suboptimal apical window. IMPRESSIONS  1. Left ventricular ejection fraction, by estimation, is 55 to 60%. The left ventricle has normal function. The left ventricle has no regional wall motion abnormalities. There is mild left ventricular hypertrophy. Left ventricular diastolic parameters are consistent with Grade II diastolic dysfunction (pseudonormalization).  2. Right ventricular systolic function is normal. The right ventricular size is normal.  3. Left atrial size was moderately dilated.  4. Right atrial size was mildly dilated.  5. The mitral valve is grossly normal. Mild mitral valve regurgitation.  6. Aortic valve area 0.8cm2, DVI 0.25, Vmax 2.31m/s, Mean gradient 40mmHg. The aortic valve was not well visualized. Aortic valve regurgitation is not visualized. Moderate to severe aortic valve stenosis. FINDINGS  Left Ventricle: Left ventricular ejection fraction, by estimation, is 55 to 60%. The left ventricle has normal function. The left ventricle has no regional wall motion abnormalities. The left ventricular internal cavity size was normal in size. There is  mild left ventricular hypertrophy. Left ventricular diastolic parameters are consistent with Grade II diastolic dysfunction (  pseudonormalization). Right Ventricle: The right ventricular size is normal. No increase in right ventricular wall thickness. Right ventricular  systolic function is normal. Left Atrium: Left atrial size was moderately dilated. Right Atrium: Right atrial size was mildly dilated. Pericardium: There is no evidence of pericardial effusion. Mitral Valve: The mitral valve is grossly normal. Mild mitral valve regurgitation. MV peak gradient, 10.9 mmHg. The mean mitral valve gradient is 4.0 mmHg. Tricuspid Valve: The tricuspid valve is not well visualized. Tricuspid valve regurgitation is not demonstrated. Aortic Valve: Aortic valve area 0.8cm2, DVI 0.25, Vmax 2.28m/s, Mean gradient 36mmHg. The aortic valve was not well visualized. Aortic valve regurgitation is not visualized. Moderate to severe aortic stenosis is present. Aortic valve mean gradient measures 19.0 mmHg. Aortic valve peak gradient measures 31.7 mmHg. Aortic valve area, by VTI measures 0.85 cm. Pulmonic Valve: The pulmonic valve was not well visualized. Pulmonic valve regurgitation is not visualized. Aorta: The aortic root is normal in size and structure. Venous: The inferior vena cava was not well visualized. IAS/Shunts: No atrial level shunt detected by color flow Doppler.  LEFT VENTRICLE PLAX 2D LVIDd:         4.27 cm  Diastology LVIDs:         3.14 cm  LV e' medial:    3.70 cm/s LV PW:         1.14 cm  LV E/e' medial:  37.6 LV IVS:        0.99 cm  LV e' lateral:   3.81 cm/s LVOT diam:     2.00 cm  LV E/e' lateral: 36.5 LV SV:         50 LV SV Index:   32 LVOT Area:     3.14 cm  RIGHT VENTRICLE RV Basal diam:  3.13 cm RV S prime:     13.50 cm/s TAPSE (M-mode): 3.3 cm LEFT ATRIUM           Index       RIGHT ATRIUM           Index LA diam:      3.40 cm 2.16 cm/m  RA Area:     18.50 cm LA Vol (A4C): 64.3 ml 40.90 ml/m RA Volume:   53.00 ml  33.71 ml/m  AORTIC VALVE                    PULMONIC VALVE AV Area (Vmax):    0.69 cm     PV Vmax:        0.54 m/s AV Area (Vmean):   0.67 cm     PV Peak grad:   1.1 mmHg AV Area (VTI):     0.85 cm     RVOT Peak grad: 4 mmHg AV Vmax:           281.67 cm/s  AV Vmean:          206.000 cm/s AV VTI:            0.586 m AV Peak Grad:      31.7 mmHg AV Mean Grad:      19.0 mmHg LVOT Vmax:         62.00 cm/s LVOT Vmean:        43.900 cm/s LVOT VTI:          0.158 m LVOT/AV VTI ratio: 0.27  AORTA Ao Root diam: 2.70 cm MITRAL VALVE MV Area (PHT): 3.99 cm     SHUNTS MV Peak grad:  10.9 mmHg  Systemic VTI:  0.16 m MV Mean grad:  4.0 mmHg     Systemic Diam: 2.00 cm MV Vmax:       1.65 m/s MV Vmean:      95.2 cm/s MV Decel Time: 190 msec MV E velocity: 139.00 cm/s MV A velocity: 118.00 cm/s MV E/A ratio:  1.18 Kate Sable MD Electronically signed by Kate Sable MD Signature Date/Time: 09/01/2020/6:06:18 PM    Final     Scheduled Meds: . aspirin EC  81 mg Oral Daily  . calcium acetate  1,334 mg Oral TID WC  . carvedilol  3.125 mg Oral BID WC  . Chlorhexidine Gluconate Cloth  6 each Topical Daily  . epoetin (EPOGEN/PROCRIT) injection  10,000 Units Intravenous Q T,Th,Sa-HD  . furosemide  80 mg Oral Daily  . ipratropium-albuterol  3 mL Nebulization QID  . isosorbide mononitrate  60 mg Oral Daily  . lidocaine  1 patch Transdermal Q24H  . mirtazapine  15 mg Oral QHS  . oxybutynin  15 mg Oral Daily  . pravastatin  40 mg Oral QHS  . predniSONE  40 mg Oral Q breakfast  . sertraline  50 mg Oral QHS  . sodium chloride flush  3 mL Intravenous Q12H   Continuous Infusions: . sodium chloride       LOS: 4 days   Time spent: 30 minutes.  Desma Maxim, MD [facility]  If 7PM-7AM, please contact night-coverage [email or web address]  09/02/2020, 3:54 PM

## 2020-09-02 NOTE — Progress Notes (Signed)
Patient with c/o chest pain at this time. Primary RN (E.Fakayode) made aware.Patient rates chest pain at 5 from 0-10 scale, no radiating pain. BFR decreased to 341ml/hr. Dr. Juleen China made aware and ordered treatment to be completed with UF remaining off. Primary RN and Camera operator (Tammy) both came to bedside to administer Nitroglycerin tablets x 2 with patient stating improvement with chest pain rated at 1. Patient requesting to reduce BFR at this time to 300 then to 281ml. Will continue treatment as tolerated.

## 2020-09-03 DIAGNOSIS — J9601 Acute respiratory failure with hypoxia: Secondary | ICD-10-CM

## 2020-09-03 DIAGNOSIS — J441 Chronic obstructive pulmonary disease with (acute) exacerbation: Secondary | ICD-10-CM | POA: Diagnosis not present

## 2020-09-03 DIAGNOSIS — J810 Acute pulmonary edema: Secondary | ICD-10-CM | POA: Diagnosis not present

## 2020-09-03 DIAGNOSIS — J9621 Acute and chronic respiratory failure with hypoxia: Secondary | ICD-10-CM | POA: Diagnosis not present

## 2020-09-03 DIAGNOSIS — N186 End stage renal disease: Secondary | ICD-10-CM | POA: Diagnosis not present

## 2020-09-03 LAB — CBC
HCT: 26 % — ABNORMAL LOW (ref 36.0–46.0)
Hemoglobin: 8.5 g/dL — ABNORMAL LOW (ref 12.0–15.0)
MCH: 29.7 pg (ref 26.0–34.0)
MCHC: 32.7 g/dL (ref 30.0–36.0)
MCV: 90.9 fL (ref 80.0–100.0)
Platelets: 151 10*3/uL (ref 150–400)
RBC: 2.86 MIL/uL — ABNORMAL LOW (ref 3.87–5.11)
RDW: 15.4 % (ref 11.5–15.5)
WBC: 8.3 10*3/uL (ref 4.0–10.5)
nRBC: 0.5 % — ABNORMAL HIGH (ref 0.0–0.2)

## 2020-09-03 MED ORDER — NITROGLYCERIN 0.4 MG SL SUBL
0.4000 mg | SUBLINGUAL_TABLET | SUBLINGUAL | Status: DC | PRN
  Filled 2020-09-03 (×5): qty 1

## 2020-09-03 MED ORDER — CALCIUM ACETATE (PHOS BINDER) 667 MG PO CAPS
1334.0000 mg | ORAL_CAPSULE | Freq: Three times a day (TID) | ORAL | 1 refills | Status: DC
Start: 2020-09-03 — End: 2020-09-15

## 2020-09-03 MED ORDER — ISOSORBIDE MONONITRATE ER 60 MG PO TB24: 60.0000 mg | ORAL_TABLET | Freq: Every day | ORAL | 1 refills | Status: AC

## 2020-09-03 NOTE — Progress Notes (Signed)
Progress Note  [name] Date of Encounter: 09/03/2020  Primary Cardiologist: Ida Rogue, MD   Subjective   No chest pain or shortness of breath at rest.  No racing heart rate or palpitations.  She ambulated earlier today and reports dyspnea with ambulation.  She was able to get to the hallway before dyspnea began.  She is uncertain of how long she needed to rest before relief of shortness of breath.  She attributes shortness of breath due to deconditioning and is eager to go home.  She is still on nasal cannula oxygen and reports that they have arranged for home oxygen for her.  Inpatient Medications    Scheduled Meds: . aspirin EC  81 mg Oral Daily  . calcium acetate  1,334 mg Oral TID WC  . carvedilol  3.125 mg Oral BID WC  . Chlorhexidine Gluconate Cloth  6 each Topical Daily  . furosemide  80 mg Oral Daily  . heparin  5,000 Units Subcutaneous Q8H  . ipratropium-albuterol  3 mL Nebulization QID  . isosorbide mononitrate  60 mg Oral Daily  . lidocaine  1 patch Transdermal Q24H  . mirtazapine  15 mg Oral QHS  . oxybutynin  15 mg Oral Daily  . pravastatin  40 mg Oral QHS  . predniSONE  40 mg Oral Q breakfast  . sertraline  50 mg Oral QHS  . sodium chloride flush  3 mL Intravenous Q12H   Continuous Infusions: . sodium chloride     PRN Meds: sodium chloride, acetaminophen, albuterol, dextromethorphan-guaiFENesin, hydrALAZINE, HYDROcodone-acetaminophen, hydrOXYzine, lidocaine-prilocaine, nitroGLYCERIN, ondansetron (ZOFRAN) IV, sodium chloride flush   Vital Signs    Vitals:   09/03/20 0756 09/03/20 0810 09/03/20 1211 09/03/20 1300  BP: (!) 122/47  (!) 128/50   Pulse: 69  83   Resp: 17  18   Temp: 98.4 F (36.9 C)  98.6 F (37 C)   TempSrc: Oral  Oral   SpO2: 100% 97% 100% 99%  Weight:      Height:        Intake/Output Summary (Last 24 hours) at 09/03/2020 1359 Last data filed at 09/03/2020 0950 Gross per 24 hour  Intake 480 ml    Output 230 ml  Net 250 ml   Last 3 Weights 09/03/2020 09/02/2020 09/01/2020  Weight (lbs) 134 lb 12.8 oz 135 lb 12.9 oz 134 lb 0.6 oz  Weight (kg) 61.145 kg 61.6 kg 60.8 kg      Telemetry    NSR- Personally Reviewed  ECG    No new tracings- Personally Reviewed  Physical Exam   GEN: No acute distress.  Sitting in recliner next to bed. Neck: No JVD.  On nasal cannula oxygen. Cardiac: RRR, 3/6 systolic murmur at RUSB.  No rubs or gallops.  Respiratory: Trace bibasilar crackles. Clear to auscultation bilaterally. GI: Soft, nontender, non-distended  MS: No edema; No deformity. Neuro:  Nonfocal  Psych: Normal affect   Labs    High Sensitivity Troponin:   Recent Labs  Lab 08/30/20 0200 08/30/20 0441 08/30/20 2216 08/31/20 0119 08/31/20 1239  TROPONINIHS 706* 1,148* 1,497* 1,186* 7,200*      Chemistry Recent Labs  Lab 08/28/20 3557 08/29/20 0440 08/31/20 0451 09/01/20 0333 09/02/20 0552  NA 130*   < > 139 140 137  K 4.8   < > 4.9 4.1 4.5  CL 90*   < > 97* 95* 94*  CO2 25   < > 31 31 31   GLUCOSE 182*   < >  125* 104* 109*  BUN 66*   < > 30* 29* 65*  CREATININE 7.00*   < > 3.92* 3.35* 5.51*  CALCIUM 8.5*   < > 9.0 9.2 8.8*  PROT 7.0  --   --   --   --   ALBUMIN 4.1  --   --   --   --   AST 17  --   --   --   --   ALT 12  --   --   --   --   ALKPHOS 118  --   --   --   --   BILITOT 0.8  --   --   --   --   GFRNONAA 5*   < > 11* 13* 7*  ANIONGAP 15   < > 11 14 12    < > = values in this interval not displayed.     Hematology Recent Labs  Lab 09/01/20 0333 09/02/20 0552 09/03/20 0546  WBC 8.0 8.2 8.3  RBC 2.90* 2.79* 2.86*  HGB 8.7* 8.4* 8.5*  HCT 26.0* 25.1* 26.0*  MCV 89.7 90.0 90.9  MCH 30.0 30.1 29.7  MCHC 33.5 33.5 32.7  RDW 16.1* 15.5 15.4  PLT 150 134* 151    BNP Recent Labs  Lab 08/28/20 0959 08/29/20 0440 08/30/20 2216  BNP 697.1* 948.0* 1,601.9*     DDimer No results for input(s): DDIMER in the last 168 hours.   Radiology     No results found.  Cardiac Studies   Echo 04/2020 1. Left ventricular ejection fraction, by estimation, is 60 to 65%. The  left ventricle has normal function. The left ventricle has no regional  wall motion abnormalities. Left ventricular diastolic parameters are  consistent with Grade I diastolic  dysfunction (impaired relaxation). Elevated left atrial pressure.  2. Right ventricular systolic function is normal. The right ventricular  size is normal. Tricuspid regurgitation signal is inadequate for assessing  PA pressure.  3. Left atrial size was mildly dilated.  4. Moderately elevated transmitral gradient is noted, most likely due to  bulky annular calcification. The mitral valve was not well visualized.  Trivial mitral valve regurgitation.  5. The aortic valve has an indeterminant number of cusps. Aortic valve  regurgitation is trivial. Moderate to severe aortic valve stenosis. Aortic  valve mean gradient measures 23.0 mmHg. Aortic valve Vmax measures 3.33  m/s.    09/2019 R/[facility]  Severe, calcified ostial to distal left main, 80%.  Severe ostial LAD, 99% followed by an eccentric 50 to 70% proximal LAD after the first diagonal.  Ostial to proximal 60% circumflex. The second obtuse marginal contains segmental 60 to 70% narrowing. The circumflex is dominant.  Nondominant RCA.  Moderately severe to severe aortic stenosis with valve area of 0.99 cm, peak to peak gradient 20 mmHg, mean gradient 28 mmHg, based upon cardiac output of 7.2 L/min and corresponding cardiac index of 4.83 L/min/m  Severe ascending and descending aortic calcification RECOMMENDATIONS  Heart team approach: Determine if patient has surgical options with SAVR and CABG. Consider extremely high risk PCI which would require hemodynamic support. Consider palliative care given end-stage kidney disease on dialysis, severe anemia, and poor long-term prognosis even if stent therapy is  successful.  Discussed with Dr. Burt Knack.   Patient Profile     74 y.o. female with history of chronic chest pain felt to be secondary to costochondritis with negative stress test in the past.  She has known severe aortic valve stenosis,  ESRD on HD TTS, recurrent flash pulmonary edema, recent respiratory/PEA arrest, anemia of chronic disease, aortic atherosclerosis, anxiety, DM2, hypertension, hyperlipidemia, obesity, asthma, COPD, OSA, and physical deconditioning and seen today for NSTEMI.   Assessment & Plan    Non-STEMI  History of CAD  --No current CP.  Ambulated earlier today with some dyspnea, which she attributes to deconditioning.  H/o of chronic chest pain with history of costochondritis, as well as chest pain in the setting of volume overload prior to hemodialysis.  In addition, she has severe aortic stenosis, which likely contributes to her discomfort.  09/2019 LHC revealed severe left main and ostial LAD disease.  04/2020 echo as above with normal EF and severe aortic stenosis.  EKG without acute ST/T changes.  High-sensitivity troponin peaked 7200.  She has been considered extremely high risk for PCI and deemed not a candidate for CABG/TAVR.   --No plan for further ischemic work-up at this time.   --Continue medical management with ASA/statin/beta-blocker.  Continue Imdur 60 mg daily.  Further recommendations per rounding MD if needed.  Acute hypoxic respiratory failure/COPD exacerbation --Continue breathing treatment as recommended by IM/PCP.   --She did note some dyspnea with ambulation.  Consider home PT. --Per patient, she is being discharged with home oxygen.    Severe aortic stenosis --04/2020 echo as above.  Unfortunately, she is not felt a candidate for sternotomy/CABG or high risk PCI.  Not a candidate for SAVR/TAVR.  Overall prognosis has been deemed poor.  Consider as likely contributing to her volume status.   --Continue outpatient hemodialysis for volume management.    --If nephrology feels safe to do so, consider outpatient diuretic on days she does not receive hemodialysis.  Will defer to nephrology.  ESRD on HD --As noted in her most recent office visit, hemodialysis relieves her symptoms of chest pain and shortness of breath.     --Continue outpatient hemodialysis. --As above, consider outpatient diuretic on days she does not receive hemodialysis since felt safe to do so by nephrology and to prevent recurrent volume overload, given her severe aortic stenosis.  HTN  --BP stable. Continue medications.  HLD --Continue statin.  DM2 --Glycemic control recommended for risk factor modification.  Continue management per PCP.  Anemia of chronic disease --Received PRBC on 10/12.  Consider as contributing to her current anginal symptoms.  Continue to monitor CBC as an outpatient. Most recent Hgb 8.3 with hematocrit 26.0.   For questions or updates, please contact [place] Please consult [email or web address] for contact info under        Signed, Arvil Chaco, PA-C  09/03/2020, 2:00 PM

## 2020-09-03 NOTE — Progress Notes (Signed)
[facility]  ROUNDING NOTE   Subjective:   Patient tolerated dialysis well yesterday, however had an episode of chest pain while in dialysis. She also reports another episode of chest pain during the night, in no acute distress or pain now. She is on 2 L of oxygen via nasal cannula, denies worsening shortness of breath.    Objective:  Vital signs in last 24 hours:  Temp:  [98.4 F (36.9 C)-99 F (37.2 C)] 98.6 F (37 C) (10/15 1211) Pulse Rate:  [69-91] 83 (10/15 1211) Resp:  [16-23] 18 (10/15 1211) BP: (97-139)/(47-65) 128/50 (10/15 1211) SpO2:  [96 %-100 %] 99 % (10/15 1300) Weight:  [61.1 kg] 61.1 kg (10/15 0428)  Weight change: 0.345 kg Filed Weights   09/01/20 2105 09/02/20 0434 09/03/20 0428  Weight: 60.8 kg 61.6 kg 61.1 kg    Intake/Output: I/O last 3 completed shifts: In: 360 [P.O.:360] Out: 230 [Urine:230]   Intake/Output this shift:  Total I/O In: 240 [P.O.:240] Out: -   Physical Exam: General:  Resting in bed with her laptop, caregiver at the bedside  Head: Moist oral mucosal membranes  Eyes:  Anicteric  Neck: Supple, trachea at midline  Lungs:  Respirations even, unlabored, fine crackles at the bases  Heart:  Regular rate and rhythm, S1S2 +murmur  Abdomen:  Soft, nontender, non distended  Extremities: No peripheral edema.  Neurologic: oriented x 3  Skin: No acute  Lesions or rashes  Access: Left AVF +thrill,+ bruit    Basic Metabolic Panel: Recent Labs  Lab 08/29/20 0440 08/29/20 0440 08/30/20 0441 08/30/20 0441 08/31/20 0451 09/01/20 0333 09/02/20 0552  NA 136  --  137  --  139 140 137  K 5.2*  --  5.2*  --  4.9 4.1 4.5  CL 93*  --  92*  --  97* 95* 94*  CO2 27  --  29  --  31 31 31   GLUCOSE 158*  --  114*  --  125* 104* 109*  BUN 36*  --  62*  --  30* 29* 65*  CREATININE 4.86*  --  6.49*  --  3.92* 3.35* 5.51*  CALCIUM 8.6*   < > 9.1   < > 9.0 9.2 8.8*  MG  --   --  2.8*  --  2.1 2.2  --    < > = values in this interval  not displayed.    Liver Function Tests: Recent Labs  Lab 08/28/20 0958  AST 17  ALT 12  ALKPHOS 118  BILITOT 0.8  PROT 7.0  ALBUMIN 4.1   No results for input(s): LIPASE, AMYLASE in the last 168 hours. No results for input(s): AMMONIA in the last 168 hours.  CBC: Recent Labs  Lab 08/28/20 0958 08/29/20 0440 08/30/20 1003 08/31/20 0451 09/01/20 0333 09/02/20 0552 09/03/20 0546  WBC 12.2*   < > 16.1* 14.2* 8.0 8.2 8.3  NEUTROABS 9.3*  --   --   --   --   --   --   HGB 10.1*   < > 8.2* 7.8* 8.7* 8.4* 8.5*  HCT 30.7*   < > 25.1* 23.5* 26.0* 25.1* 26.0*  MCV 89.0   < > 90.9 89.4 89.7 90.0 90.9  PLT 208   < > 190 175 150 134* 151   < > = values in this interval not displayed.    Cardiac Enzymes: No results for input(s): CKTOTAL, CKMB, CKMBINDEX, TROPONINI in the last 168 hours.  BNP: Invalid  input(s): POCBNP  CBG: Recent Labs  Lab 08/28/20 1953 08/30/20 2129 09/01/20 0722 09/01/20 1140  GLUCAP 205* 199* 82 119*    Microbiology: Results for orders placed or performed during the hospital encounter of 08/28/20  Blood culture (routine single)     Status: None   Collection Time: 08/28/20  9:59 AM   Specimen: BLOOD  Result Value Ref Range Status   Specimen Description BLOOD RAC  Final   Special Requests   Final    BOTTLES DRAWN AEROBIC AND ANAEROBIC Blood Culture adequate volume   Culture   Final    NO GROWTH 5 DAYS Performed at [facility], [address], [place], [place] [address]    Report Status 09/02/2020 FINAL  Final  Respiratory Panel by RT PCR (Flu A&B, Covid) - Nasopharyngeal Swab     Status: None   Collection Time: 08/28/20  9:59 AM   Specimen: Nasopharyngeal Swab  Result Value Ref Range Status   SARS Coronavirus 2 by RT PCR NEGATIVE NEGATIVE Final    Comment: (NOTE) SARS-CoV-2 target nucleic acids are NOT DETECTED.  The SARS-CoV-2 RNA is generally detectable in upper respiratoy specimens during the acute phase of infection. The  lowest concentration of SARS-CoV-2 viral copies this assay can detect is 131 copies/mL. A negative result does not preclude SARS-Cov-2 infection and should not be used as the sole basis for treatment or other patient management decisions. A negative result may occur with  improper specimen collection/handling, submission of specimen other than nasopharyngeal swab, presence of viral mutation(s) within the areas targeted by this assay, and inadequate number of viral copies (<131 copies/mL). A negative result must be combined with clinical observations, patient history, and epidemiological information. The expected result is Negative.  Fact Sheet for Patients:  PinkCheek.be  Fact Sheet for Healthcare Providers:  GravelBags.it  This test is no t yet approved or cleared by the [place] FDA and  has been authorized for detection and/or diagnosis of SARS-CoV-2 by FDA under an Emergency Use Authorization (EUA). This EUA will remain  in effect (meaning this test can be used) for the duration of the COVID-19 declaration under Section 564(b)(1) of the Act, 21 U.S.C. section 360bbb-3(b)(1), unless the authorization is terminated or revoked sooner.     Influenza A by PCR NEGATIVE NEGATIVE Final   Influenza B by PCR NEGATIVE NEGATIVE Final    Comment: (NOTE) The Xpert Xpress SARS-CoV-2/FLU/RSV assay is intended as an aid in  the diagnosis of influenza from Nasopharyngeal swab specimens and  should not be used as a sole basis for treatment. Nasal washings and  aspirates are unacceptable for Xpert Xpress SARS-CoV-2/FLU/RSV  testing.  Fact Sheet for Patients: PinkCheek.be  Fact Sheet for Healthcare Providers: GravelBags.it  This test is not yet approved or cleared by the [place] FDA and  has been authorized for detection and/or diagnosis of SARS-CoV-2 by  FDA under  an Emergency Use Authorization (EUA). This EUA will remain  in effect (meaning this test can be used) for the duration of the  Covid-19 declaration under Section 564(b)(1) of the Act, 21  U.S.C. section 360bbb-3(b)(1), unless the authorization is  terminated or revoked. Performed at [facility], [address] [place],  [address]     Coagulation Studies: No results for input(s): LABPROT, INR in the last 72 hours.  Urinalysis: No results for input(s): COLORURINE, LABSPEC, PHURINE, GLUCOSEU, HGBUR, BILIRUBINUR, KETONESUR, PROTEINUR, UROBILINOGEN, NITRITE, LEUKOCYTESUR in the last 72 hours.  Invalid input(s): APPERANCEUR  Imaging: No results found.   Medications:   . sodium chloride     . aspirin EC  81 mg Oral Daily  . calcium acetate  1,334 mg Oral TID WC  . carvedilol  3.125 mg Oral BID WC  . Chlorhexidine Gluconate Cloth  6 each Topical Daily  . furosemide  80 mg Oral Daily  . heparin  5,000 Units Subcutaneous Q8H  . ipratropium-albuterol  3 mL Nebulization QID  . isosorbide mononitrate  60 mg Oral Daily  . lidocaine  1 patch Transdermal Q24H  . mirtazapine  15 mg Oral QHS  . oxybutynin  15 mg Oral Daily  . pravastatin  40 mg Oral QHS  . predniSONE  40 mg Oral Q breakfast  . sertraline  50 mg Oral QHS  . sodium chloride flush  3 mL Intravenous Q12H   sodium chloride, acetaminophen, albuterol, dextromethorphan-guaiFENesin, hydrALAZINE, HYDROcodone-acetaminophen, hydrOXYzine, lidocaine-prilocaine, nitroGLYCERIN, ondansetron (ZOFRAN) IV, sodium chloride flush  Assessment/ Plan:  Phyllis [name] is a 74 y.o. white female with end stage renal disease on hemodialysis, hypertension, aortic stenosis, COPD, diabetes mellitus type II, chronic costochrondritis who was admitted to [facility] on 08/28/2020 for Flash pulmonary edema ([place]) [J81.0] Hypoxia [R09.02] Acute on chronic respiratory failure with hypoxia ([place]) [J96.21] Acute respiratory failure with  hypoxia ([place]) [J96.01]  [facility] TTS [place]. Left AVF 60kg  1. End Stage Renal Disease with hyperkalemia: emergent hemodialysis treatment on admission.  Seems patient has not been getting to her dry weight as an outpatient. Leaving at [place] [facility].   Patient had an episode of chest pain during dialysis yesterday, otherwise tolerated dialysis well. Volume and electrolyte status acceptable No need for additional dialysis today Will continue TTS schedule   2. Hypertension: BP readings within acceptable range - On Carvedilol,Furosemide,Imdur and PRN Hydralazine   3. Anemia with chronic kidney disease:  -Hemoglobin 8.5 -We will continue Epogen 10,000 units TTS with dialysis  4. Secondary Hyperparathyroidism:  -Calcium 8.8 on 09/02/20 -Continue calcium acetate with meals   LOS: 5 Laporche Martelle 10/15/20212:06 PM

## 2020-09-03 NOTE — Evaluation (Signed)
Physical Therapy Evaluation Patient Details Name: Phyllis [name] MRN: [id number] DOB: 1946/05/28 Today's Date: 09/03/2020   History of Present Illness  Phyllis [name] is a 74 y/o female whose chief complaint was progressively worsening SOB. Pt was brought to the ED by EMS who placed pt on c-pap then was placed on bipap in the ED. Pt diagnosed with acute on chronic respiratory failure with hypoxia. PMH includes ESRD-H (TTS), HTN, HLD, diet-controlled diabetes, COPD, asthma, CVA, GERD, gout, depression with anxiety, severe aortic stenosis, OSA, CHF, anemia, and costochondritis with chronic chest wall pain.  Clinical Impression  Pt received lying in bed upon arrival to room and pleasantly agreed to PT evaluation. Pt goes by "Phyllis [name]". Pt on 2L O2 with O2 sats at 98% HR 85. Pt performed bed mobility with mod A for truncal elevation into full upright sitting and for scooting hips to edge of bed with good sitting balance requiring supervision for safety. Pt stood to RW with min A for anterior weight shift and balance while transitioning to full upright standing position. Pt ambulated 90 feet with RW and CGA for steadying/safety and deferred further ambulation distance secondary to fatigue. Pt seated in recliner chair and performed BLE therex. O2 sats remained above 98% during all activities on 2L O2. Pt presents with deficits in strength, balance, functional activity tolerance, and functional mobility. Pt would benefit from skilled PT during acute stay to address aforementioned deficits and HHPT at discharge to optimize PLOF and maximize functional mobility, safety, and independence with activities.    Follow Up Recommendations Home health PT    Equipment Recommendations  None recommended by PT;Other (comment) (pt has equipment already)    Recommendations for Other Services       Precautions / Restrictions Precautions Precautions: Fall Restrictions Weight Bearing Restrictions: No       Mobility  Bed Mobility Overal bed mobility: Needs Assistance Bed Mobility: Supine to Sit     Supine to sit: Mod assist;HOB elevated     General bed mobility comments: Mod A for scooting hips forward and elevating trunk into full upright sitting.  Transfers Overall transfer level: Needs assistance Equipment used: Rolling walker (2 wheeled) Transfers: Sit to/from Stand Sit to Stand: Min assist         General transfer comment: Min A for anterior weight shift and boosting hips to stand  Ambulation/Gait Ambulation/Gait assistance: Min guard Gait Distance (Feet): 90 Feet Assistive device: Rolling walker (2 wheeled) Gait Pattern/deviations: Step-to pattern Gait velocity: decreased   General Gait Details: Pt ambulated 90 feet using RW with CGA for steadying; noted no overt LOB throughout ambulation distance  Stairs            Wheelchair Mobility    Modified Rankin (Stroke Patients Only)       Balance Overall balance assessment: Needs assistance Sitting-balance support: Bilateral upper extremity supported Sitting balance-Leahy Scale: Good Sitting balance - Comments: no overt LOB with unsupported back Postural control: Posterior lean Standing balance support: Bilateral upper extremity supported;During functional activity Standing balance-Leahy Scale: Fair Standing balance comment: posterior lean requiring CGA for steadying with BUE on RW                             Pertinent Vitals/Pain Pain Assessment: Faces Faces Pain Scale: Hurts a little bit Pain Location: L shoulder Pain Descriptors / Indicators: Discomfort;Sore Pain Intervention(s): Monitored during session    Home Living Family/patient expects to be discharged  to:: Private residence Living Arrangements: Alone Available Help at Discharge: Personal care attendant;Available 24 hours/day Type of Home: House Home Access: Stairs to enter Entrance Stairs-Rails: Left Entrance Stairs-Number  of Steps: 4 Home Layout: Multi-level;Able to live on main level with bedroom/bathroom Home Equipment: Gilford Rile - 2 wheels;Bedside commode;Shower seat;Grab bars - tub/shower;Other (comment) (lift chair) Additional Comments: Pt reports that she no longer has been sleeping in her bed becuase it is too high and that she sleeps in a recliner chair.    Prior Function Level of Independence: Needs assistance   Gait / Transfers Assistance Needed: Pt ambulates using RW with CGA for steadying from caregivers; [place] for transfers  ADL's / Homemaking Assistance Needed: has 24/7 personal caregiver assistance  Comments: Pt denies fall history in last 6 months and reports ambulating only household distances.     Hand Dominance        Extremity/Trunk Assessment   Upper Extremity Assessment Upper Extremity Assessment: Generalized weakness (grossly 3+ to 4/5 bilaterally)    Lower Extremity Assessment Lower Extremity Assessment: Generalized weakness (grossly 3+ to 4/5 bilaterally)       Communication   Communication: No difficulties  Cognition Arousal/Alertness: Awake/alert Behavior During Therapy: WFL for tasks assessed/performed Overall Cognitive Status: Within Functional Limits for tasks assessed                                 General Comments: Pt A&O x 4      General Comments      Exercises Other Exercises Other Exercises: in sitting: SLR, hip ab/add, and resisted leg presses x 10 bilaterally   Assessment/Plan    PT Assessment Patient needs continued PT services  PT Problem List Decreased strength;Decreased activity tolerance;Decreased balance;Decreased mobility;Cardiopulmonary status limiting activity;Pain       PT Treatment Interventions DME instruction;Gait training;Stair training;Functional mobility training;Therapeutic activities;Therapeutic exercise;Balance training;Patient/family education    PT Goals (Current goals can be found in the Care Plan section)   Acute Rehab PT Goals Patient Stated Goal: to get stronger and go home PT Goal Formulation: With patient Time For Goal Achievement: 09/17/20 Potential to Achieve Goals: Good    Frequency Min 2X/week   Barriers to discharge        Co-evaluation               AM-PAC PT "6 Clicks" Mobility  Outcome Measure Help needed turning from your back to your side while in a flat bed without using bedrails?: A Little Help needed moving from lying on your back to sitting on the side of a flat bed without using bedrails?: A Lot Help needed moving to and from a bed to a chair (including a wheelchair)?: A Little Help needed standing up from a chair using your arms (e.g., wheelchair or bedside chair)?: A Little Help needed to walk in hospital room?: A Little Help needed climbing 3-5 steps with a railing? : A Lot 6 Click Score: 16    End of Session Equipment Utilized During Treatment: Gait belt;Oxygen;Other (comment) (2L O2) Activity Tolerance: Patient limited by fatigue;Patient tolerated treatment well Patient left: in chair;with call bell/phone within reach;with chair alarm set Nurse Communication: Mobility status PT Visit Diagnosis: Unsteadiness on feet (R26.81);Muscle weakness (generalized) (M62.81)    Time: 6761-9509 PT Time Calculation (min) (ACUTE ONLY): 33 min   Charges:              Vale Haven, SPT  Vale Haven 09/03/2020,  2:15 PM

## 2020-09-03 NOTE — Progress Notes (Signed)
SATURATION QUALIFICATIONS: (This note is used to comply with regulatory documentation for home oxygen)  Patient Saturations on Room Air at Rest = 88%  Patient Saturations on Room Air while Ambulating =89%  Patient Saturations on 2 Liters of oxygen while Ambulating = 94%  Please briefly explain why patient needs home oxygen:Pt exerted with standing from chair to bed, and complain of difficulty breathing

## 2020-09-03 NOTE — TOC Transition Note (Signed)
Transition of Care [facility]) - CM/SW Discharge Note   Patient Details  Name: LARISHA VENCILL MRN: [id number] Date of Birth: Apr 12, 1946  [facility] CM/SW Contact:  Eileen Stanford, LCSW Phone Number: 09/03/2020, 3:47 PM   Clinical Narrative:  Pt's HH has been arranged through [facility]. Pt will get 02 at bedside prior to d/c.     Final next level of care: [place] Barriers to Discharge: No Barriers Identified   Patient Goals and CMS Choice Patient states their goals for this hospitalization and ongoing recovery are:: to return home with 24/7 care per [facility] CMS Medicare.gov Compare Post Acute Care list provided to:: Patient Represenative (must comment) Choice offered to / list presented to : [place] / [place]  Discharge Placement                    Patient and family notified of of transfer: 09/03/20  Discharge Plan and Services                DME Arranged: Oxygen DME Agency: [employer] Date DME Agency Contacted: 09/03/20 Time DME Agency Contacted: [phone] Representative spoke with at DME Agency: zach HH Arranged: RN, PT [place] Agency: [place] ([place]) Date [place]: 09/03/20 Time [place]: [place] Representative spoke with at [place]: [place] ([place]) Interventions     Readmission Risk Interventions Readmission Risk Prevention Plan 08/31/2020 04/22/2020 03/31/2020  Transportation Screening Complete Complete Complete  Medication Review Press photographer) Complete Complete Complete  PCP or Specialist appointment within 3-5 days of discharge Complete (No Data) -  [place] or Home Care Consult Complete Complete -  SW Recovery Care/Counseling Consult Complete - -  Palliative Care Screening - Not Applicable -  [place] Complete Not Applicable -  Some recent data might be hidden

## 2020-09-03 NOTE — Discharge Summary (Signed)
Phyllis [name] XBM:[id number] DOB: [birth year] DOA: 08/28/2020  PCP: [name], MD  Admit date: 08/28/2020 Discharge date: 09/03/2020  Time spent: 35 minutes  Recommendations for Outpatient Follow-up:  1. Outpatient cardiology f/u in the next week weeks with [facility], pt spoke w/ Dr. Arta Bruce day of discharge and she says he said he will "work her in." 2. Will be discharged on 2 L [place] O2    Discharge Diagnoses:  Principal Problem:   Acute on chronic respiratory failure with hypoxia (HCC) Active Problems:   ESRD (end stage renal disease) on dialysis (HCC)   Hyperlipidemia   CVA (cerebral vascular accident) ([place])   Chronic diastolic heart failure (HCC)   HTN (hypertension)   Aortic valve stenosis   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-stage renal disease) ([place])   Flash pulmonary edema (HCC)   COPD exacerbation ([place])   Depression with anxiety   Type II diabetes mellitus with renal manifestations ([place])   Acute respiratory failure with hypoxia ([place])   Discharge Condition: stable  Diet recommendation: heart healthy low sodium  Filed Weights   09/01/20 2105 09/02/20 0434 09/03/20 0428  Weight: 60.8 kg 61.6 kg 61.1 kg    History of present illness:  Per admission HPI: Phyllis [name] a 74 y.o.femalewith medical history significant ofESRD-HD (TTS), hypertension, hyperlipidemia, diet-controlled diabetes, COPD, asthma, stroke, GERD, gout, depression with anxiety, severe aortic stenosis, OSA, CHF, anemia, who presentedwith shortness of breath. Patient also developed some chest pain and elevated troponin.  Multiple comorbidities.    Hospital Course:  NSTEMI in setting of known multi-vessel CAD and moderate to severe aortic stenosis.  Cardiology was consulted with concern of NSTEMI.  Patient also has an history of chronic costochondritis.  Troponin peaked at 1497. Required bipap night of 10/11. Per cardiology note patient has multiple comorbidities along with a prior PEA arrest requiring  resuscitation.  She is not a candidate for any further ischemic work-up or intervention.  Per cardiology note her severe aortic stenosis is very high risk for any procedure or even TAVR. Was started on heparin gtt, now off. - They were recommending palliative care consult which was performed. Patient affirmed wants aggressive ongoing treatment -Continue with medical management. Off amlod due to low BPs. Continuing aspirin statin and carvedilol and new imdur added by cardiology. Losartan also on hold.  - will f/u with cardiology  Acute hypoxic respiratory failure/COPD exacerbation.  Required bipap, now off and breathing comfortably. Patient has multiple hospital visits with similar symptoms.  Most likely secondary to volume overload.  Symptoms improved with dialysis. She is saturating well on 2 L of oxygen and will go home on 2 L. - treated w/ 7 days glucocorticoids and 5 days azithromycin  Severe aortic stenosis.  Not a candidate for any surgical intervention or TAVR. -Continue with medical management.  ESRD (end stage renal disease) on dialysis ([place]).  Patient normally had dialysis Tuesday, Thursday and Saturday we then added dialysis every other week on Monday. -Continueddialysis per nephrology protocol and her schedule.  Hyperkalemia.  Resolved -Losartan was discontinued per nephrology.  CVA (cerebral vascular accident) ([place]) -Continued aspirin, pravastatin  Chronic diastolic heart failure ([place]): 2D echo on 04/20/2020 showed EF 60-65% with grade 1 diastolic dysfunction.  -Volume management per renal by dialysis -continued home Lasix 80 mg daily  HTN (hypertension) -Continued home Coreg, hydralazine, and Lasix -d/c losartan, per nephrology. Amlodipine also held, imdur started.  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-stage renal disease) ([place]): -s/p 1 unit of PRBC at cardiologist's request as they  want to keep hemoglobin above 8. H stable -EPO with HD  Chronic pain on chronic opioids Rx  confirmed on PDMP -continued home Norco PRN  Depression with anxiety -continued home Remeron and Sertraline. -Continued Atarax PRN for anxiety  Diet controlledType II diabetes mellitus with renal manifestations ([place]): Recent A1c 5.1, well controlled. Patient is not taking medications at home.   Procedures:  echocardiogram  Consultations:  Cardiology, nephrology  Discharge Exam: Vitals:   09/03/20 1211 09/03/20 1300  BP: (!) 128/50   Pulse: 83   Resp: 18   Temp: 98.6 F (37 C)   SpO2: 100% 99%    General.  Well-developed lady, in no acute distress. Pulmonary.  Bilateral basal rales, normal respiratory effort. CV.  Regular rate and rhythm, no JVD, moderate systolic murmur Abdomen.  Soft, obese nontender, nondistended, BS positive. CNS.  Alert and oriented x3.  No focal neurologic deficit. Extremities.  No edema, no cyanosis, pulses intact and symmetrical. Psychiatry.  Judgment and insight appears normal.  Discharge Instructions   Discharge Instructions    Call MD for:  difficulty breathing, headache or visual disturbances   Complete by: As directed    Call MD for:  extreme fatigue   Complete by: As directed    Call MD for:  hives   Complete by: As directed    Call MD for:  persistant dizziness or light-headedness   Complete by: As directed    Call MD for:  persistant nausea and vomiting   Complete by: As directed    Call MD for:  redness, tenderness, or signs of infection (pain, swelling, redness, odor or green/yellow discharge around incision site)   Complete by: As directed    Call MD for:  severe uncontrolled pain   Complete by: As directed    Call MD for:  temperature >100.4   Complete by: As directed    Diet - low sodium heart healthy   Complete by: As directed    For home use only DME oxygen   Complete by: As directed    Length of Need: Lifetime   Mode or (Route): Nasal cannula   Liters per Minute: 2   Oxygen delivery system: Gas   Increase  activity slowly   Complete by: As directed      Allergies as of 09/03/2020      Reactions   Enalapril Maleate Other (See Comments)   Other reaction(s): Headache   Nitrofurantoin Swelling, Rash   Other Reaction: swelling of body   Sulfamethoxazole-trimethoprim Swelling   2,4-d Dimethylamine (amisol) Rash, Other (See Comments)   Other Reaction: h/a   Baclofen Other (See Comments), Nausea Only   lightheadness ,drowsiness , muscle weakness , twitching in hands    Neosporin [neomycin-bacitracin Zn-polymyx] Other (See Comments), Rash   Other Reaction: irritation Skin irritation   Quinine Nausea And Vomiting, Rash, Other (See Comments)   Other Reaction: Vomiting, rash, h/a, vision   Ultram [tramadol] Palpitations   Zocor [simvastatin] Other (See Comments), Rash   Other Reaction: muscle spasms Muscle pain and spasms   Bactrim [sulfamethoxazole-trimethoprim] Swelling   Levodopa Other (See Comments)   Reaction: unknown   Macrodantin [nitrofurantoin Macrocrystal] Swelling   Quinine Derivatives Other (See Comments)   Vertigo,nausea vomiting blurred vision headache ears sensitivity      Medication List    STOP taking these medications   amLODipine 5 MG tablet Commonly known as: NORVASC   hydrALAZINE 50 MG tablet Commonly known as: APRESOLINE   losartan 50 MG tablet  Commonly known as: COZAAR     TAKE these medications   albuterol 108 (90 Base) MCG/ACT inhaler Commonly known as: VENTOLIN HFA Inhale 2 puffs into the lungs every 6 (six) hours as needed for wheezing or shortness of breath. Out of medication   Aspirin 81 81 MG EC tablet Generic drug: aspirin Take 1 tablet by mouth daily.   calcium acetate 667 MG capsule Commonly known as: PHOSLO Take 2 capsules (1,334 mg total) by mouth 3 (three) times daily with meals. What changed: when to take this   carvedilol 3.125 MG tablet Commonly known as: COREG Take 1 tablet (3.125 mg total) by mouth 2 (two) times daily with a  meal.   epoetin alfa 10000 UNIT/ML injection Commonly known as: EPOGEN Inject 0.4 mLs (4,000 Units total) into the vein Every Tuesday,Thursday,and Saturday with dialysis.   furosemide 80 MG tablet Commonly known as: Lasix Take 1 tablet (80 mg total) by mouth daily.   HYDROcodone-acetaminophen 7.5-325 MG tablet Commonly known as: NORCO Take 1 tablet by mouth every 6 (six) hours as needed (pain).   ipratropium-albuterol 0.5-2.5 (3) MG/3ML Soln Commonly known as: DUONEB Take 3 mLs by nebulization every 6 (six) hours as needed.   isosorbide mononitrate 60 MG 24 hr tablet Commonly known as: IMDUR Take 1 tablet (60 mg total) by mouth daily. Start taking on: September 04, 2020   lidocaine 5 % Commonly known as: [place] 1 patch onto the skin daily. Remove & Discard patch within 12 hours or as directed by MD   lidocaine-prilocaine cream Commonly known as: EMLA Apply 1 application topically as needed (prior to treatment).   mirtazapine 15 MG tablet Commonly known as: REMERON Take 15 mg by mouth at bedtime.   nitroGLYCERIN 0.4 MG SL tablet Commonly known as: NITROSTAT Place 1 tablet (0.4 mg total) under the tongue every 5 (five) minutes as needed for chest pain.   oxybutynin 15 MG 24 hr tablet Commonly known as: DITROPAN XL Take 15 mg by mouth daily.   pravastatin 40 MG tablet Commonly known as: PRAVACHOL Take 40 mg by mouth at bedtime.   sertraline 50 MG tablet Commonly known as: ZOLOFT Take 50 mg by mouth at bedtime.            Durable Medical Equipment  (From admission, onward)         Start     Ordered   09/03/20 0000  For home use only DME oxygen       Question Answer Comment  Length of Need Lifetime   Mode or (Route) Nasal cannula   Liters per Minute 2   Oxygen delivery system Gas      09/03/20 1548         Allergies  Allergen Reactions  . Enalapril Maleate Other (See Comments)    Other reaction(s): Headache  . Nitrofurantoin Swelling and  Rash    Other Reaction: swelling of body  . Sulfamethoxazole-Trimethoprim Swelling  . 2,4-D Dimethylamine (Amisol) Rash and Other (See Comments)    Other Reaction: h/a  . Baclofen Other (See Comments) and Nausea Only    lightheadness ,drowsiness , muscle weakness , twitching in hands   . Neosporin [Neomycin-Bacitracin Zn-Polymyx] Other (See Comments) and Rash    Other Reaction: irritation Skin irritation   . Quinine Nausea And Vomiting, Rash and Other (See Comments)    Other Reaction: Vomiting, rash, h/a, vision  . Ultram [Tramadol] Palpitations  . Zocor [Simvastatin] Other (See Comments) and Rash  Other Reaction: muscle spasms Muscle pain and spasms  . Bactrim [Sulfamethoxazole-Trimethoprim] Swelling  . Levodopa Other (See Comments)    Reaction: unknown  . Macrodantin [Nitrofurantoin Macrocrystal] Swelling  . Quinine Derivatives Other (See Comments)    Vertigo,nausea vomiting blurred vision headache ears sensitivity     Follow-up Information    [facility] Follow up on 09/08/2020.   Specialty: Cardiology Why: at 2:00pm. Enter through the [place] entrance Contact information: [address] [place]       [name], MD Follow up.   Specialty: Internal Medicine Why: Please schedule PCP appointment within 3-5 days of discharge date. Contact information: [place] [address] [phone]        Gollan, Timothy J, MD .   Specialty: Cardiology Contact information: [place] [address] [phone]                The results of significant diagnostics from this hospitalization (including imaging, microbiology, ancillary and laboratory) are listed below for reference.    Significant Diagnostic Studies: DG Chest 1 View  Result Date: 08/30/2020 CLINICAL DATA:  Shortness of  breath EXAM: CHEST  1 VIEW COMPARISON:  08/28/2020 FINDINGS: Cardiac shadow is stable. Aortic calcifications are again seen. Lungs are well aerated bilaterally. Central vascular congestion is noted with increasing interstitial change consistent with CHF. No sizable effusion is noted. No focal confluent infiltrate is seen. No bony abnormality is noted. IMPRESSION: Changes consistent with CHF. Electronically Signed   By: Inez Catalina M.D.   On: 08/30/2020 21:48   DG Chest 2 View  Result Date: 08/05/2020 CLINICAL DATA:  Shortness of breath EXAM: CHEST - 2 VIEW COMPARISON:  04/27/2020 FINDINGS: Heart is normal size. Tortuous, calcified aorta. Patchy opacity peripherally at the right lung base. Left lung clear. No effusions or acute bony abnormality. IMPRESSION: Patchy density peripherally at the right lung base concerning for pneumonia. Aortic atherosclerosis. Electronically Signed   By: Rolm Baptise M.D.   On: 08/05/2020 10:04   DG Chest Port 1 View  Result Date: 08/28/2020 CLINICAL DATA:  Sepsis.  Shortness of breath. EXAM: PORTABLE CHEST 1 VIEW COMPARISON:  08/05/2020 FINDINGS: Patient rotated right. Midline trachea. Mild cardiomegaly. Atherosclerosis in the transverse aorta. No pleural effusion or pneumothorax. Pulmonary interstitial prominence is slightly increased. Subtle right lower lobe opacity is again suspected. IMPRESSION: Interstitial thickening, felt to be slightly increased. Primarily the sequelae of smoking/chronic bronchitis. Developing mild pulmonary venous congestion cannot be excluded. Lateral right lower lobe opacity, which could represent residual or recurrent pneumonia. Aortic Atherosclerosis (ICD10-I70.0). Electronically Signed   By: Abigail Miyamoto M.D.   On: 08/28/2020 10:40   ECHOCARDIOGRAM COMPLETE  Result Date: 09/01/2020    ECHOCARDIOGRAM REPORT   Patient Name:   CYNDY BRAVER [place] Date of Exam: 09/01/2020 Medical Rec #:  [id number]           Height:       58.0 in Accession #:     [id number]          Weight:       141.5 lb Date of Birth:  10/06/1946            BSA:          1.572 m Patient Age:    69 years            BP:  125/97 mmHg Patient Gender: F                   HR:           94 bpm. Exam Location:  [facility] Procedure: 2D Echo, Color Doppler and Cardiac Doppler Indications:     NSTEMI  History:         Patient has prior history of Echocardiogram examinations, most                  recent 04/20/2020. Risk Factors:Diabetes and Hypertension.  Sonographer:     Sherrie Sport RDCS (AE) Referring Phys:  [phone] CHRISTOPHER END Diagnosing Phys: Kate Sable MD  Sonographer Comments: Suboptimal apical window. IMPRESSIONS  1. Left ventricular ejection fraction, by estimation, is 55 to 60%. The left ventricle has normal function. The left ventricle has no regional wall motion abnormalities. There is mild left ventricular hypertrophy. Left ventricular diastolic parameters are consistent with Grade II diastolic dysfunction (pseudonormalization).  2. Right ventricular systolic function is normal. The right ventricular size is normal.  3. Left atrial size was moderately dilated.  4. Right atrial size was mildly dilated.  5. The mitral valve is grossly normal. Mild mitral valve regurgitation.  6. Aortic valve area 0.8cm2, DVI 0.25, Vmax 2.42m/s, Mean gradient 58mmHg. The aortic valve was not well visualized. Aortic valve regurgitation is not visualized. Moderate to severe aortic valve stenosis. FINDINGS  Left Ventricle: Left ventricular ejection fraction, by estimation, is 55 to 60%. The left ventricle has normal function. The left ventricle has no regional wall motion abnormalities. The left ventricular internal cavity size was normal in size. There is  mild left ventricular hypertrophy. Left ventricular diastolic parameters are consistent with Grade II diastolic dysfunction (pseudonormalization). Right Ventricle: The right ventricular size is normal. No increase in right ventricular wall thickness.  Right ventricular systolic function is normal. Left Atrium: Left atrial size was moderately dilated. Right Atrium: Right atrial size was mildly dilated. Pericardium: There is no evidence of pericardial effusion. Mitral Valve: The mitral valve is grossly normal. Mild mitral valve regurgitation. MV peak gradient, 10.9 mmHg. The mean mitral valve gradient is 4.0 mmHg. Tricuspid Valve: The tricuspid valve is not well visualized. Tricuspid valve regurgitation is not demonstrated. Aortic Valve: Aortic valve area 0.8cm2, DVI 0.25, Vmax 2.29m/s, Mean gradient 58mmHg. The aortic valve was not well visualized. Aortic valve regurgitation is not visualized. Moderate to severe aortic stenosis is present. Aortic valve mean gradient measures 19.0 mmHg. Aortic valve peak gradient measures 31.7 mmHg. Aortic valve area, by VTI measures 0.85 cm. Pulmonic Valve: The pulmonic valve was not well visualized. Pulmonic valve regurgitation is not visualized. Aorta: The aortic root is normal in size and structure. Venous: The inferior vena cava was not well visualized. IAS/Shunts: No atrial level shunt detected by color flow Doppler.  LEFT VENTRICLE PLAX 2D LVIDd:         4.27 cm  Diastology LVIDs:         3.14 cm  LV e' medial:    3.70 cm/s LV PW:         1.14 cm  LV E/e' medial:  37.6 LV IVS:        0.99 cm  LV e' lateral:   3.81 cm/s LVOT diam:     2.00 cm  LV E/e' lateral: 36.5 LV SV:         50 LV SV Index:   32 LVOT Area:     3.14 cm  RIGHT  VENTRICLE RV Basal diam:  3.13 cm RV S prime:     13.50 cm/s TAPSE (M-mode): 3.3 cm LEFT ATRIUM           Index       RIGHT ATRIUM           Index LA diam:      3.40 cm 2.16 cm/m  RA Area:     18.50 cm LA Vol (A4C): 64.3 ml 40.90 ml/m RA Volume:   53.00 ml  33.71 ml/m  AORTIC VALVE                    PULMONIC VALVE AV Area (Vmax):    0.69 cm     PV Vmax:        0.54 m/s AV Area (Vmean):   0.67 cm     PV Peak grad:   1.1 mmHg AV Area (VTI):     0.85 cm     RVOT Peak grad: 4 mmHg AV Vmax:            281.67 cm/s AV Vmean:          206.000 cm/s AV VTI:            0.586 m AV Peak Grad:      31.7 mmHg AV Mean Grad:      19.0 mmHg LVOT Vmax:         62.00 cm/s LVOT Vmean:        43.900 cm/s LVOT VTI:          0.158 m LVOT/AV VTI ratio: 0.27  AORTA Ao Root diam: 2.70 cm MITRAL VALVE MV Area (PHT): 3.99 cm     SHUNTS MV Peak grad:  10.9 mmHg    Systemic VTI:  0.16 m MV Mean grad:  4.0 mmHg     Systemic Diam: 2.00 cm MV Vmax:       1.65 m/s MV Vmean:      95.2 cm/s MV Decel Time: 190 msec MV E velocity: 139.00 cm/s MV A velocity: 118.00 cm/s MV E/A ratio:  1.18 Kate Sable MD Electronically signed by Kate Sable MD Signature Date/Time: 09/01/2020/6:06:18 PM    Final     Microbiology: Recent Results (from the past 240 hour(s))  Blood culture (routine single)     Status: None   Collection Time: 08/28/20  9:59 AM   Specimen: BLOOD  Result Value Ref Range Status   Specimen Description BLOOD RAC  Final   Special Requests   Final    BOTTLES DRAWN AEROBIC AND ANAEROBIC Blood Culture adequate volume   Culture   Final    NO GROWTH 5 DAYS Performed at [facility], [address] [place], [place] [address]    Report Status 09/02/2020 FINAL  Final  Respiratory Panel by RT PCR (Flu A&B, Covid) - Nasopharyngeal Swab     Status: None   Collection Time: 08/28/20  9:59 AM   Specimen: Nasopharyngeal Swab  Result Value Ref Range Status   SARS Coronavirus 2 by RT PCR NEGATIVE NEGATIVE Final    Comment: (NOTE) SARS-CoV-2 target nucleic acids are NOT DETECTED.  The SARS-CoV-2 RNA is generally detectable in upper respiratoy specimens during the acute phase of infection. The lowest concentration of SARS-CoV-2 viral copies this assay can detect is 131 copies/mL. A negative result does not preclude SARS-Cov-2 infection and should not be used as the sole basis for treatment or other patient management decisions. A negative result may occur with  improper specimen collection/handling,  submission of specimen other than nasopharyngeal swab, presence of viral mutation(s) within the areas targeted by this assay, and inadequate number of viral copies (<131 copies/mL). A negative result must be combined with clinical observations, patient history, and epidemiological information. The expected result is Negative.  Fact Sheet for Patients:  PinkCheek.be  Fact Sheet for Healthcare Providers:  GravelBags.it  This test is no t yet approved or cleared by the [place] FDA and  has been authorized for detection and/or diagnosis of SARS-CoV-2 by FDA under an Emergency Use Authorization (EUA). This EUA will remain  in effect (meaning this test can be used) for the duration of the COVID-19 declaration under Section 564(b)(1) of the Act, 21 U.S.C. section 360bbb-3(b)(1), unless the authorization is terminated or revoked sooner.     Influenza A by PCR NEGATIVE NEGATIVE Final   Influenza B by PCR NEGATIVE NEGATIVE Final    Comment: (NOTE) The Xpert Xpress SARS-CoV-2/FLU/RSV assay is intended as an aid in  the diagnosis of influenza from Nasopharyngeal swab specimens and  should not be used as a sole basis for treatment. Nasal washings and  aspirates are unacceptable for Xpert Xpress SARS-CoV-2/FLU/RSV  testing.  Fact Sheet for Patients: PinkCheek.be  Fact Sheet for Healthcare Providers: GravelBags.it  This test is not yet approved or cleared by the [place] FDA and  has been authorized for detection and/or diagnosis of SARS-CoV-2 by  FDA under an Emergency Use Authorization (EUA). This EUA will remain  in effect (meaning this test can be used) for the duration of the  Covid-19 declaration under Section 564(b)(1) of the Act, 21  U.S.C. section 360bbb-3(b)(1), unless the authorization is  terminated or revoked. Performed at [facility], [address] [place], [place] [address]      Labs: Basic Metabolic Panel: Recent Labs  Lab 08/29/20 0440 08/30/20 0441 08/31/20 0451 09/01/20 0333 09/02/20 0552  NA 136 137 139 140 137  K 5.2* 5.2* 4.9 4.1 4.5  CL 93* 92* 97* 95* 94*  CO2 27 29 31 31 31   GLUCOSE 158* 114* 125* 104* 109*  BUN 36* 62* 30* 29* 65*  CREATININE 4.86* 6.49* 3.92* 3.35* 5.51*  CALCIUM 8.6* 9.1 9.0 9.2 8.8*  MG  --  2.8* 2.1 2.2  --    Liver Function Tests: Recent Labs  Lab 08/28/20 0958  AST 17  ALT 12  ALKPHOS 118  BILITOT 0.8  PROT 7.0  ALBUMIN 4.1   No results for input(s): LIPASE, AMYLASE in the last 168 hours. No results for input(s): AMMONIA in the last 168 hours. CBC: Recent Labs  Lab 08/28/20 0958 08/29/20 0440 08/30/20 1003 08/31/20 0451 09/01/20 0333 09/02/20 0552 09/03/20 0546  WBC 12.2*   < > 16.1* 14.2* 8.0 8.2 8.3  NEUTROABS 9.3*  --   --   --   --   --   --   HGB 10.1*   < > 8.2* 7.8* 8.7* 8.4* 8.5*  HCT 30.7*   < > 25.1* 23.5* 26.0* 25.1* 26.0*  MCV 89.0   < > 90.9 89.4 89.7 90.0 90.9  PLT 208   < > 190 175 150 134* 151   < > = values in this interval not displayed.   Cardiac Enzymes: No results for input(s): CKTOTAL, CKMB, CKMBINDEX, TROPONINI in the last 168 hours. BNP: BNP (last 3 results) Recent Labs    08/28/20 0959 08/29/20 0440 08/30/20 2216  BNP 697.1* 948.0* 1,601.9*  ProBNP (last 3 results) No results for input(s): PROBNP in the last 8760 hours.  CBG: Recent Labs  Lab 08/28/20 1953 08/30/20 2129 09/01/20 0722 09/01/20 1140  GLUCAP 205* 199* 86 119*       Signed:  Desma Maxim MD.  [facility] 09/03/2020, 3:56 PM

## 2020-09-03 NOTE — Progress Notes (Signed)
PT taken to private vehicle with home care taker alongside.  PT dispensed 5 SL nitro tablets with prescription order from MD.  Education give, pt has no questions or concerns.

## 2020-09-03 NOTE — Plan of Care (Signed)

## 2020-09-04 LAB — TYPE AND SCREEN
ABO/RH(D): O POS
Antibody Screen: NEGATIVE
Donor AG Type: NEGATIVE
Unit division: 0
Unit division: 0
Unit division: 0

## 2020-09-04 LAB — BPAM RBC
Blood Product Expiration Date: 202110162359
Blood Product Expiration Date: 202110212359
Blood Product Expiration Date: 202110212359
ISSUE DATE / TIME: 202110121929
Unit Type and Rh: 5100
Unit Type and Rh: 5100
Unit Type and Rh: 9500

## 2020-09-08 ENCOUNTER — Telehealth: Payer: Self-pay | Admitting: Family

## 2020-09-08 ENCOUNTER — Ambulatory Visit: Payer: Medicare Other | Admitting: Family

## 2020-09-08 NOTE — Telephone Encounter (Signed)
Patient did not show for her Heart Failure Clinic appointment on 09/08/20. Will attempt to reschedule.

## 2020-09-10 ENCOUNTER — Telehealth: Payer: Self-pay | Admitting: Family

## 2020-09-10 NOTE — Telephone Encounter (Signed)
LVM with patient in attempt to reschedule a no show CHF Clinic appointment.   Rosealynn Mateus, NT

## 2020-09-14 NOTE — Progress Notes (Signed)
Cardiology Office Note  Date:  09/15/2020   ID:  [name], DOB 1946-01-26, MRN [id number]  PCP:  [name], MD   No chief complaint on file.   HPI:  [name] is a very pleasant 74 year old woman with history of  diffuse aortic atherosclerosis seen on CT scan 2014,  obesity,  hypertension,  hyperlipidemia  Aortic valve stenosis, Chronic respiratory distress, COPD End-stage renal disease on hemodialysis Chronic angina and atypical chest pain on the right who presents for follow-up of her hypertension, atherosclerosis, episodes of chest pain.   Last seen in clinic January 2021  Echocardiogram June 2021 results reviewed  normal LV function EF greater than 60%  Moderate to severe aortic valve stenosis mean gradient 23 mmHg Peak velocity 3.33 m/s Estimated aortic valve area 0.78 cm  No dramatic change compared to February 2019  Echo: 10/21 6. Aortic valve area 0.8cm2, DVI 0.25, Vmax 2.30m/s, Mean gradient  76mmHg. The aortic valve was not well visualized. Aortic valve  regurgitation is not visualized. Moderate to severe aortic valve stenosis.   Recently in the hospital for chest pain October 2021, Pulmonary edema Troponin elevation in the setting of demand ischemia Imdur increased up to 60 Also with atypical component to her chest pain, musculoskeletal on the right which she uses Lidoderm patches and pain pills  Chronic anemia, HGB 8.5 on 09/03/2020 Underwent transfusion  Does hemodialysis 3 days a week, often 4 days a week On Lasix but reports it does not work very well  Non-STEMI/coronary disease with stable angina Severe multivessel coronary disease by catheterization November 2020 Not a candidate for intervention  In terms of her aortic valve stenosis, Not a candidate forTAVR per the notes or SAVR  She does not want hospice Would like to continue dialysis  Other past medical history reviewed  Stress test 09/2017 Showing no  significant ischemia Normal wall motion, EF estimated at 52% Low risk scan   Previous CT abd in 2014: Diffuse athero in aorta  admission to the hospital 12/04/2014 for acute respiratory failure with hypoxia, found to have acute on chronic renal failure, 25 pound weight gain  AV fistula was not working on the left and temporary dialysis catheter was placed and dialysis performed 3 in the hospital.  stress test 02/12/2014 showing no ischemia echocardiogram March 2015 showing normal LV function, no significant valve problems  PMH:   has a past medical history of (HFpEF) heart failure with preserved ejection fraction ([place]), Allergy, Anemia of chronic disease, Anxiety, Aortic atherosclerosis ([place]), Asthma, Chronic back pain, COPD (chronic obstructive pulmonary disease) ([place]), Diabetes mellitus with complication ([place]), ESRD on hemodialysis ([place]), Essential hypertension, Fistula, GERD (gastroesophageal reflux disease), Gout, History of exercise stress test, HLD (hyperlipidemia), Non-obstructive Carotid arterial disease ([place]), 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, Severe aortic stenosis, and Sleep apnea.  PSH:    Past Surgical History:  Procedure Laterality Date  . CARDIAC CATHETERIZATION    . carpel tunnel    . GALLBLADDER SURGERY    . PERIPHERAL VASCULAR CATHETERIZATION N/A 04/12/2015   Procedure: A/V Shuntogram/Fistulagram;  Surgeon: Algernon Huxley, MD;  Location: [facility];  Service: Cardiovascular;  Laterality: N/A;  . PERIPHERAL VASCULAR CATHETERIZATION N/A 04/12/2015   Procedure: A/V Shunt Intervention;  Surgeon: Algernon Huxley, MD;  Location: [facility];  Service: Cardiovascular;  Laterality: N/A;  . PERIPHERAL VASCULAR CATHETERIZATION N/A 06/09/2015   Procedure: Dialysis/Perma Catheter Removal;  Surgeon: Katha Cabal, MD;  Location: [facility];  Service: Cardiovascular;  Laterality: N/A;  . RIGHT/LEFT HEART CATH AND CORONARY ANGIOGRAPHY N/A 10/14/2019    Procedure: RIGHT/LEFT HEART CATH AND CORONARY ANGIOGRAPHY;  Surgeon: Belva Crome, MD;  Location: [facility];  Service: Cardiovascular;  Laterality: N/A;    Current Outpatient Medications  Medication Sig Dispense Refill  . acetaminophen (TYLENOL) 500 MG tablet Take 500 mg by mouth every 6 (six) hours as needed.    Marland Kitchen albuterol (PROVENTIL HFA;VENTOLIN HFA) 108 (90 Base) MCG/ACT inhaler Inhale 2 puffs into the lungs every 6 (six) hours as needed for wheezing or shortness of breath. Out of medication    . aspirin (ASPIRIN 81) 81 MG EC tablet Take 1 tablet by mouth daily.     . busPIRone (BUSPAR) 5 MG tablet Take 5 mg by mouth 2 (two) times daily.    . carvedilol (COREG) 3.125 MG tablet Take 1 tablet (3.125 mg total) by mouth 2 (two) times daily with a meal. 60 tablet 0  . cyclobenzaprine (FLEXERIL) 10 MG tablet Take 10 mg by mouth daily as needed for muscle spasms.    Marland Kitchen epoetin alfa (EPOGEN) 10000 UNIT/ML injection Inject 0.4 mLs (4,000 Units total) into the vein Every Tuesday,Thursday,and Saturday with dialysis. 1 mL   . furosemide (LASIX) 80 MG tablet Take 1 tablet (80 mg total) by mouth daily. 30 tablet 11  . HYDROcodone-acetaminophen (NORCO) 7.5-325 MG tablet Take 1 tablet by mouth every 6 (six) hours as needed (pain). 12 tablet 0  . ipratropium-albuterol (DUONEB) 0.5-2.5 (3) MG/3ML SOLN Take 3 mLs by nebulization every 6 (six) hours as needed. 360 mL   . isosorbide mononitrate (IMDUR) 60 MG 24 hr tablet Take 1 tablet (60 mg total) by mouth daily. 30 tablet 1  . lidocaine (LIDODERM) 5 % Place 1 patch onto the skin daily. Remove & Discard patch within 12 hours or as directed by MD 30 patch 0  . lidocaine-prilocaine (EMLA) cream Apply 1 application topically as needed (prior to treatment).     . Melatonin 5 MG CHEW Chew 1 tablet by mouth at bedtime.    . mirtazapine (REMERON) 15 MG tablet Take 15 mg by mouth at bedtime.    . nitroGLYCERIN (NITROSTAT) 0.4 MG SL tablet Place 1 tablet (0.4  mg total) under the tongue every 5 (five) minutes as needed for chest pain. 25 tablet 3  . oxybutynin (DITROPAN XL) 15 MG 24 hr tablet Take 15 mg by mouth daily.    . pravastatin (PRAVACHOL) 40 MG tablet Take 40 mg by mouth at bedtime.     . senna-docusate (SENOKOT-S) 8.6-50 MG tablet Take 1 tablet by mouth daily.    . sertraline (ZOLOFT) 50 MG tablet Take 50 mg by mouth at bedtime.     No current facility-administered medications for this visit.     Allergies:   Enalapril maleate; Nitrofurantoin; Sulfamethoxazole-trimethoprim; 2,4-d dimethylamine (amisol); Baclofen; Neosporin [neomycin-bacitracin zn-polymyx]; Quinine; Ultram [tramadol]; Zocor [simvastatin]; Bactrim [sulfamethoxazole-trimethoprim]; Levodopa; Macrodantin [nitrofurantoin macrocrystal]; and Quinine derivatives   Social History:  The patient  reports that she has never smoked. She has never used smokeless tobacco. She reports that she does not drink alcohol and does not use drugs.   Family History:   family history includes Heart attack in her brother; Heart attack (age of onset: 80) in her father; Heart disease in her brother and father; Hyperlipidemia in her father and mother; Hypertension in her father and mother.    Review of Systems: Review of Systems  Constitutional:  Negative.   HENT: Negative.   Respiratory: Negative.   Cardiovascular: Negative.   Gastrointestinal: Negative.   Musculoskeletal:       Gait instability  Neurological: Negative.   Psychiatric/Behavioral: Negative.   All other systems reviewed and are negative.   PHYSICAL EXAM: VS:  BP (!) 112/52   Pulse 80   Ht 4\' 10"  (1.473 m)   BMI 28.17 kg/m  , BMI Body mass index is 28.17 kg/m. Constitutional:  oriented to person, place, and time. No distress.  HENT:  Head: Grossly normal Eyes:  no discharge. No scleral icterus.  Neck: No JVD, no carotid bruits  Cardiovascular: Regular rate and rhythm, no murmurs appreciated Pulmonary/Chest: Clear to  auscultation bilaterally, no wheezes or rails Abdominal: Soft.  no distension.  no tenderness.  Musculoskeletal: Normal range of motion Neurological:  normal muscle tone. Coordination normal. No atrophy Skin: Skin warm and dry Psychiatric: normal affect, pleasant   Recent Labs: 08/28/2020: ALT 12 08/30/2020: B Natriuretic Peptide 1,601.9 09/01/2020: Magnesium 2.2 09/02/2020: BUN 65; Creatinine, Ser 5.51; Potassium 4.5; Sodium 137 09/03/2020: Hemoglobin 8.5; Platelets 151    Lipid Panel Lab Results  Component Value Date   CHOL 180 01/17/2018   HDL 48 01/17/2018   LDLCALC 101 (H) 01/17/2018   TRIG 154 (H) 01/17/2018    Wt Readings from Last 3 Encounters:  09/03/20 134 lb 12.8 oz (61.1 kg)  08/05/20 130 lb 1.1 oz (59 kg)  06/28/20 130 lb (59 kg)     ASSESSMENT AND PLAN:  Chronic diastolic CHF (congestive heart failure) (HCC) - Fluid management per dialysis 3-4 times per week Recommended she talk with renal team to see if Lasix could be changed to torsemide  Hyperlipidemia, unspecified hyperlipidemia type - Continue pravastatin Prior numbers above goal.  Stressed importance of taking her medication May need to add Zetia  SOB (shortness of breath) - Plan: EKG 12-Lead Secondary to deconditioning, morbid obesity, pulmonary edema aortic valve stenosis and coronary disease Medical management  End stage renal disease ([place]) - Plan: EKG 12-Lead Has hemodialysis 3 -4 days per week  Managed by Dr. Holley Raring  Dependence on hemodialysis [facility]) - Plan: EKG 12-Lead Extra hemodialysis every other week  Type 2 diabetes mellitus with other specified complication, unspecified long term insulin use status ([place]) - Plan: EKG 12-Lead Weight stable, unable to exercise,  Aortic valve stenosis Moderate to severe Not a candidate for TAVR or SAVR  Anemia Given severe coronary disease, aortic valve stenosis, tenuous fluid status, would likely be best served with hemoglobin close to 10 May  need periodic transfusion  Long discussion with her concerning multiple medical issues as detailed above Very complicated  Total encounter time more than 35 minutes  Greater than 50% was spent in counseling and coordination of care with the patient     No orders of the defined types were placed in this encounter.    Signed, Esmond Plants, M.D., Ph.D. 09/15/2020  [place], [place]

## 2020-09-15 ENCOUNTER — Encounter: Payer: Self-pay | Admitting: Cardiovascular Disease

## 2020-09-15 ENCOUNTER — Other Ambulatory Visit: Payer: Self-pay

## 2020-09-15 ENCOUNTER — Ambulatory Visit (INDEPENDENT_AMBULATORY_CARE_PROVIDER_SITE_OTHER): Payer: Medicare Other | Admitting: Cardiovascular Disease

## 2020-09-15 VITALS — BP 112/52 | HR 80 | Ht <= 58 in

## 2020-09-15 DIAGNOSIS — Z992 Dependence on renal dialysis: Secondary | ICD-10-CM

## 2020-09-15 DIAGNOSIS — I5032 Chronic diastolic (congestive) heart failure: Secondary | ICD-10-CM | POA: Diagnosis not present

## 2020-09-15 DIAGNOSIS — I10 Essential (primary) hypertension: Secondary | ICD-10-CM

## 2020-09-15 DIAGNOSIS — E782 Mixed hyperlipidemia: Secondary | ICD-10-CM

## 2020-09-15 DIAGNOSIS — N186 End stage renal disease: Secondary | ICD-10-CM

## 2020-09-15 DIAGNOSIS — I700 Atherosclerosis of aorta: Secondary | ICD-10-CM | POA: Diagnosis not present

## 2020-09-15 DIAGNOSIS — I350 Nonrheumatic aortic (valve) stenosis: Secondary | ICD-10-CM

## 2020-09-15 NOTE — Patient Instructions (Signed)
Talk with Dr. Holley Raring about maintaining HGB at 10   Medication Instructions:  No changes  Ask Dr. Holley Raring about torsemide instead of lasix Torsemide 40 twice a day or 80 at one time  If you need a refill on your cardiac medications before your next appointment, please call your pharmacy.    Lab work: No new labs needed   If you have labs (blood work) drawn today and your tests are completely normal, you will receive your results only by: Marland Kitchen MyChart Message (if you have MyChart) OR . A paper copy in the mail If you have any lab test that is abnormal or we need to change your treatment, we will call you to review the results.   Testing/Procedures: No new testing needed   Follow-Up: At [facility], you and your health needs are our priority.  As part of our continuing mission to provide you with exceptional heart care, we have created designated Provider Care Teams.  These Care Teams include your primary Cardiologist (physician) and Advanced Practice Providers (APPs -  Physician Assistants and Nurse Practitioners) who all work together to provide you with the care you need, when you need it.  . You will need a follow up appointment in 6 months  . Providers on your designated Care Team:   . Murray Hodgkins, NP . Christell Faith, PA-C . Marrianne Mood, PA-C  Any Other Special Instructions Will Be Listed Below (If Applicable).  COVID-19 Vaccine Information can be found at: [email or web address].uk For questions related to vaccine distribution or appointments, please email [email or web address] or call 6401699876.

## 2020-09-30 ENCOUNTER — Emergency Department
Admission: EM | Admit: 2020-09-30 | Discharge: 2020-09-30 | Disposition: A | Discharge status: Home / Self Care | Payer: Medicare Other | Attending: Emergency Medicine | Admitting: Emergency Medicine

## 2020-09-30 ENCOUNTER — Other Ambulatory Visit: Payer: Self-pay

## 2020-09-30 ENCOUNTER — Encounter: Payer: Self-pay | Admitting: Emergency Medicine

## 2020-09-30 DIAGNOSIS — I5033 Acute on chronic diastolic (congestive) heart failure: Secondary | ICD-10-CM | POA: Insufficient documentation

## 2020-09-30 DIAGNOSIS — I132 Hypertensive heart and chronic kidney disease with heart failure and with stage 5 chronic kidney disease, or end stage renal disease: Secondary | ICD-10-CM | POA: Diagnosis not present

## 2020-09-30 DIAGNOSIS — Z992 Dependence on renal dialysis: Secondary | ICD-10-CM | POA: Insufficient documentation

## 2020-09-30 DIAGNOSIS — J441 Chronic obstructive pulmonary disease with (acute) exacerbation: Secondary | ICD-10-CM | POA: Diagnosis not present

## 2020-09-30 DIAGNOSIS — Z79899 Other long term (current) drug therapy: Secondary | ICD-10-CM | POA: Insufficient documentation

## 2020-09-30 DIAGNOSIS — E1122 Type 2 diabetes mellitus with diabetic chronic kidney disease: Secondary | ICD-10-CM | POA: Insufficient documentation

## 2020-09-30 DIAGNOSIS — D649 Anemia, unspecified: Secondary | ICD-10-CM | POA: Diagnosis not present

## 2020-09-30 DIAGNOSIS — I2510 Atherosclerotic heart disease of native coronary artery without angina pectoris: Secondary | ICD-10-CM | POA: Diagnosis not present

## 2020-09-30 DIAGNOSIS — J45909 Unspecified asthma, uncomplicated: Secondary | ICD-10-CM | POA: Diagnosis not present

## 2020-09-30 DIAGNOSIS — Z7982 Long term (current) use of aspirin: Secondary | ICD-10-CM | POA: Diagnosis not present

## 2020-09-30 DIAGNOSIS — N186 End stage renal disease: Secondary | ICD-10-CM | POA: Insufficient documentation

## 2020-09-30 DIAGNOSIS — D638 Anemia in other chronic diseases classified elsewhere: Secondary | ICD-10-CM

## 2020-09-30 DIAGNOSIS — R718 Other abnormality of red blood cells: Secondary | ICD-10-CM | POA: Diagnosis present

## 2020-09-30 LAB — CBC
HCT: 23 % — ABNORMAL LOW (ref 36.0–46.0)
Hemoglobin: 7.3 g/dL — ABNORMAL LOW (ref 12.0–15.0)
MCH: 31.3 pg (ref 26.0–34.0)
MCHC: 31.7 g/dL (ref 30.0–36.0)
MCV: 98.7 fL (ref 80.0–100.0)
Platelets: 168 10*3/uL (ref 150–400)
RBC: 2.33 MIL/uL — ABNORMAL LOW (ref 3.87–5.11)
RDW: 16.1 % — ABNORMAL HIGH (ref 11.5–15.5)
WBC: 6.1 10*3/uL (ref 4.0–10.5)
nRBC: 0 % (ref 0.0–0.2)

## 2020-09-30 LAB — BASIC METABOLIC PANEL
Anion gap: 14 (ref 5–15)
BUN: 17 mg/dL (ref 8–23)
CO2: 31 mmol/L (ref 22–32)
Calcium: 8.6 mg/dL — ABNORMAL LOW (ref 8.9–10.3)
Chloride: 90 mmol/L — ABNORMAL LOW (ref 98–111)
Creatinine, Ser: 3.3 mg/dL — ABNORMAL HIGH (ref 0.44–1.00)
GFR, Estimated: 14 mL/min — ABNORMAL LOW (ref >60)
Glucose, Bld: 151 mg/dL — ABNORMAL HIGH (ref 70–99)
Potassium: 3.7 mmol/L (ref 3.5–5.1)
Sodium: 135 mmol/L (ref 135–145)

## 2020-09-30 NOTE — ED Triage Notes (Signed)
Pt in via POV with personal caregiver; pt sent from Dialysis for blood transfusion due to reported Hgb 6.7.  Reports hx of anemia and prior transfusions.  Complaints of ongoing shortness of breath since previous hospitalization.  NAD noted at this time.

## 2020-09-30 NOTE — ED Provider Notes (Signed)
[facility] Emergency Department Provider Note   ____________________________________________    I have reviewed the triage vital signs and the nursing notes.   HISTORY  Chief Complaint Abnormal Lab     HPI Phyllis [name] is a 74 y.o. female with a history of end-stage renal disease, diabetes, COPD, heart failure as noted below who presents with reported abnormal hemoglobin.  Patient had dialysis today, was notified when she was found that her hemoglobin was 6.7, was told to go to the emergency department as we were expecting her for an infusion.  She was upset to find that that was not the case.  She reports she feels quite well, no lightheadedness no shortness of breath, no dizziness.  Past Medical History:  Diagnosis Date  . (HFpEF) heart failure with preserved ejection fraction ([place])    a. TTE 01/2014: nl LV sys fxn, no valvular abnormalities; b. TTE 11/16: nl EF, mild LVH;  c. 04/2019 Echo: EF 60-65%. DD. Nl RV fxn. Mod AS. 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  . Allergy   . Anemia of chronic disease   . Anxiety   . Aortic atherosclerosis ([place])   . Asthma   . Chronic back pain   . COPD (chronic obstructive pulmonary disease) ([place])   . Diabetes mellitus with complication ([place])   . ESRD on hemodialysis ([place])    a. Tues/Sat; b. 2/2 small kidneys  . Essential hypertension   . Fistula    lower left arm  . GERD (gastroesophageal reflux disease)   . Gout   . History of exercise stress test    a. 01/2014: no evidence of ischemia; b. Lexiscan 08/2015: no sig ischemia, severe GI uptake artifact, low risk; c. CPET @ [facility] 09/2016: exercised 3 min 12 sec on bike without incline, 2.28 METs, VO2 of 8.1, 48% of predicted, indicating mod to sev functional impairment, evidence of blunted HR, stroke volume, and BP augmentation as well as ventilation-perfusion mismatch with exercise  . HLD (hyperlipidemia)   . Non-obstructive Carotid arterial  disease ([place])    a. 12/2017: <50% bilat ICA dzs.  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 Severe aortic stenosis    a. by 09/2019 echo b. 04/2019 Echo: Mod AS.  Marland Kitchen Sleep apnea     Patient Active Problem List   Diagnosis Date Noted  . Acute respiratory failure with hypoxia ([place]) 08/29/2020  . Acute on chronic respiratory failure with hypoxia ([place]) 08/28/2020  . Depression with anxiety 08/28/2020  . Type II diabetes mellitus with renal manifestations ([place]) 08/28/2020  . Acute on chronic diastolic (congestive) heart failure ([place]) 04/30/2020  . COPD with chronic bronchitis ([place]) 04/30/2020  . Dyspnea 04/20/2020  . Asymptomatic bacteriuria 12/05/2019  . COPD with acute exacerbation ([name]) 10/23/2019  . Elevated troponin 10/23/2019  . CAD (coronary artery disease) 10/23/2019  . COPD exacerbation ([place]) 10/23/2019  . Leukocytosis 10/23/2019  . Anxiety 10/23/2019  . Palliative care by specialist   . Goals of care, counseling/discussion   . Flash pulmonary edema ([place]) 10/14/2019  . Respiratory failure with hypoxia ([place]) 10/11/2019  . 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-stage renal disease) ([place])   . Aortic valve stenosis   . Acute respiratory failure with hypoxemia ([place]) 09/20/2019  . COPD with acute bronchitis ([place]) 09/05/2019  . Congestive heart failure ([place]) 05/24/2019  . Hyperkalemia 11/29/2018  . Chronic diastolic heart failure ([place]) 11/02/2018  . HTN (hypertension) 11/02/2018  .  Uncontrolled hypertension 10/21/2018  . History of acute pulmonary edema 10/21/2018  . Pressure injury of skin 03/16/2018  . CVA (cerebral vascular accident) ([place]) 01/16/2018  . Aortic atherosclerosis ([place]) 12/11/2017  . Chest pain 09/18/2017  . Hyperlipidemia 08/06/2015  . Complication from renal dialysis device 04/12/2015  . SOB (shortness of breath) 02/01/2015  . ESRD (end stage renal disease) on dialysis ([place]) 02/01/2015  . Type 2 diabetes mellitus with other specified complication ([place])  56/25/6389  . Asthma 02/01/2015    Past Surgical History:  Procedure Laterality Date  . CARDIAC CATHETERIZATION    . carpel tunnel    . GALLBLADDER SURGERY    . PERIPHERAL VASCULAR CATHETERIZATION N/A 04/12/2015   Procedure: A/V Shuntogram/Fistulagram;  Surgeon: Algernon Huxley, MD;  Location: [facility];  Service: Cardiovascular;  Laterality: N/A;  . PERIPHERAL VASCULAR CATHETERIZATION N/A 04/12/2015   Procedure: A/V Shunt Intervention;  Surgeon: Algernon Huxley, MD;  Location: [facility];  Service: Cardiovascular;  Laterality: N/A;  . PERIPHERAL VASCULAR CATHETERIZATION N/A 06/09/2015   Procedure: Dialysis/Perma Catheter Removal;  Surgeon: Katha Cabal, MD;  Location: [facility];  Service: Cardiovascular;  Laterality: N/A;  . RIGHT/LEFT HEART CATH AND CORONARY ANGIOGRAPHY N/A 10/14/2019   Procedure: RIGHT/LEFT HEART CATH AND CORONARY ANGIOGRAPHY;  Surgeon: Belva Crome, MD;  Location: [place] [facility];  Service: Cardiovascular;  Laterality: N/A;    Prior to Admission medications   Medication Sig Start Date End Date Taking? Authorizing Provider  acetaminophen (TYLENOL) 500 MG tablet Take 500 mg by mouth every 6 (six) hours as needed.    [provider]  albuterol (PROVENTIL HFA;VENTOLIN HFA) 108 (90 Base) MCG/ACT inhaler Inhale 2 puffs into the lungs every 6 (six) hours as needed for wheezing or shortness of breath. Out of medication    [provider]  aspirin (ASPIRIN 81) 81 MG EC tablet Take 1 tablet by mouth daily.  01/27/20   [provider]  busPIRone (BUSPAR) 5 MG tablet Take 5 mg by mouth 2 (two) times daily. 09/08/20   [provider]  carvedilol (COREG) 3.125 MG tablet Take 1 tablet (3.125 mg total) by mouth 2 (two) times daily with a meal. 04/30/20   Dhungel, Nishant, MD  cyclobenzaprine (FLEXERIL) 10 MG tablet Take 10 mg by mouth daily as needed for muscle spasms.    [provider]  epoetin alfa (EPOGEN)  10000 UNIT/ML injection Inject 0.4 mLs (4,000 Units total) into the vein Every Tuesday,Thursday,and Saturday with dialysis. 04/03/20   Nolberto Hanlon, MD  furosemide (LASIX) 80 MG tablet Take 1 tablet (80 mg total) by mouth daily. 04/02/20 04/02/21  Nolberto Hanlon, MD  HYDROcodone-acetaminophen (NORCO) 7.5-325 MG tablet Take 1 tablet by mouth every 6 (six) hours as needed (pain). 10/08/19   Danford, Suann Larry, MD  ipratropium-albuterol (DUONEB) 0.5-2.5 (3) MG/3ML SOLN Take 3 mLs by nebulization every 6 (six) hours as needed. 10/17/19   [facility], Tami Lin, PA  isosorbide mononitrate (IMDUR) 60 MG 24 hr tablet Take 1 tablet (60 mg total) by mouth daily. 09/04/20   Wouk, Ailene Rud, MD  lidocaine (LIDODERM) 5 % Place 1 patch onto the skin daily. Remove & Discard patch within 12 hours or as directed by MD 10/14/19   Flora Lipps, MD  lidocaine-prilocaine (EMLA) cream Apply 1 application topically as needed (prior to treatment).     [provider]  Melatonin 5 MG CHEW Chew 1 tablet by mouth at bedtime.  [provider]  mirtazapine (REMERON) 15 MG tablet Take 15 mg by mouth at bedtime. 02/25/20   [provider]  nitroGLYCERIN (NITROSTAT) 0.4 MG SL tablet Place 1 tablet (0.4 mg total) under the tongue every 5 (five) minutes as needed for chest pain. 09/02/20   Minna Merritts, MD  oxybutynin (DITROPAN XL) 15 MG 24 hr tablet Take 15 mg by mouth daily. 12/24/17   [provider]  pravastatin (PRAVACHOL) 40 MG tablet Take 40 mg by mouth at bedtime.     [provider]  senna-docusate (SENOKOT-S) 8.6-50 MG tablet Take 1 tablet by mouth daily.    [provider]  sertraline (ZOLOFT) 50 MG tablet Take 50 mg by mouth at bedtime. 03/02/20   [provider]     Allergies Enalapril maleate; Nitrofurantoin; Sulfamethoxazole-trimethoprim; 2,4-d dimethylamine (amisol); Baclofen; Bactrim [sulfamethoxazole-trimethoprim]; Macrodantin [nitrofurantoin  macrocrystal]; Neosporin [neomycin-bacitracin zn-polymyx]; Quinine; Quinine derivatives; Ultram [tramadol]; Zocor [simvastatin]; and Levodopa  Family History  Problem Relation Age of Onset  . Hypertension Mother   . Hyperlipidemia Mother   . Heart disease Father   . Heart attack Father 82  . Hypertension Father   . Hyperlipidemia Father   . Heart disease Brother        CABG   . Heart attack Brother   . Breast cancer Neg Hx     Social History Social History   Tobacco Use  . Smoking status: Never Smoker  . Smokeless tobacco: Never Used  Vaping Use  . Vaping Use: Never used  Substance Use Topics  . Alcohol use: No  . Drug use: No    Review of Systems  Constitutional: No fever/chills Eyes: No visual changes.  ENT: No sore throat. Cardiovascular: Denies chest pain. Respiratory: Denies shortness of breath. Gastrointestinal: No abdominal pain.  Genitourinary: Negative for dysuria. Musculoskeletal: Negative for back pain. Skin: Negative for rash. Neurological: Negative for headaches    ____________________________________________   PHYSICAL EXAM:  VITAL SIGNS: ED Triage Vitals  Enc Vitals Group     BP 09/30/20 1654 112/71     Pulse Rate 09/30/20 1641 92     Resp 09/30/20 1641 (!) 21     Temp 09/30/20 1641 98.1 F (36.7 C)     Temp Source 09/30/20 1641 Oral     SpO2 09/30/20 1641 100 %     Weight 09/30/20 1529 60.8 kg (134 lb)     Height 09/30/20 1529 1.473 m (4\' 10" )     Head Circumference --      Peak Flow --      Pain Score 09/30/20 1529 0     Pain Loc --      Pain Edu? --      Excl. in [place]? --     Constitutional: Alert and oriented.   Nose: No congestion/rhinnorhea. Mouth/Throat: Mucous membranes are moist.    Cardiovascular: Normal rate, regular rhythm. Good peripheral circulation. Respiratory: Normal respiratory effort.  No retractions. Gastrointestinal: Soft and nontender. No distention.    Musculoskeletal: No lower extremity tenderness nor  edema.  Warm and well perfused Neurologic:  Normal speech and language. No gross focal neurologic deficits are appreciated.  Skin:  Skin is warm, dry and intact. No rash noted. Psychiatric: Mood and affect are normal. Speech and behavior are normal.  ____________________________________________   LABS (all labs ordered are listed, but only abnormal results are displayed)  Labs Reviewed  CBC - Abnormal; Notable for the following components:      Result Value  RBC 2.33 (*)    Hemoglobin 7.3 (*)    HCT 23.0 (*)    RDW 16.1 (*)    All other components within normal limits  BASIC METABOLIC PANEL - Abnormal; Notable for the following components:   Chloride 90 (*)    Glucose, Bld 151 (*)    Creatinine, Ser 3.30 (*)    Calcium 8.6 (*)    GFR, Estimated 14 (*)    All other components within normal limits  TYPE AND SCREEN  TYPE AND SCREEN   ____________________________________________  EKG  None ____________________________________________  RADIOLOGY  None ____________________________________________   PROCEDURES  Procedure(s) performed: No  Procedures   Critical Care performed: No ____________________________________________   INITIAL IMPRESSION / ASSESSMENT AND PLAN / ED COURSE  Pertinent labs & imaging results that were available during my care of the patient were reviewed by me and considered in my medical decision making (see chart for details).  Patient presents for evaluation of low hemoglobin, was under the impression that she would be getting a transfusion upon arrival to the ED waiting room.  Hemoglobin here of 7.3.  Patient's baseline hemoglobin runs between 7.5 and 8.5 consistent with her anemia of chronic disease.  Normal stools, no vomiting.  Offered transfusion here in the emergency department however she declined and would prefer to follow-up with hematology for outpatient schedule transfusion as needed.  This is appropriate as she is completely  asymptomatic  Return precautions discussed.    ____________________________________________   FINAL CLINICAL IMPRESSION(S) / ED DIAGNOSES  Final diagnoses:  Anemia of chronic disease        Note:  This document was prepared using Dragon voice recognition software and may include unintentional dictation errors.   Lavonia Drafts, MD 09/30/20 [phone]

## 2020-09-30 NOTE — ED Notes (Signed)
Pt wanting to leave. Pt inquiring about having blood transfusion outpatient and not having to stay overnight or in the hospital. Pt advised that we don't know if she will need to stay but she can discuss it with the MD once in a room. Pt agrees to stay just until her bloodwork comes back and then she will decide if she is going to stay or go.

## 2020-10-11 ENCOUNTER — Other Ambulatory Visit: Payer: Self-pay | Admitting: *Deleted

## 2020-10-11 ENCOUNTER — Encounter: Payer: Self-pay | Admitting: Internal Medicine

## 2020-10-11 ENCOUNTER — Inpatient Hospital Stay: Payer: Medicare Other | Attending: Internal Medicine | Admitting: Internal Medicine

## 2020-10-11 ENCOUNTER — Inpatient Hospital Stay: Payer: Medicare Other

## 2020-10-11 ENCOUNTER — Telehealth: Payer: Self-pay | Admitting: Internal Medicine

## 2020-10-11 ENCOUNTER — Other Ambulatory Visit: Payer: Self-pay

## 2020-10-11 VITALS — BP 114/47 | HR 81 | Temp 96.8°F | Resp 16 | Ht <= 58 in | Wt 132.0 lb

## 2020-10-11 DIAGNOSIS — Z79899 Other long term (current) drug therapy: Secondary | ICD-10-CM | POA: Insufficient documentation

## 2020-10-11 DIAGNOSIS — D631 Anemia in chronic kidney disease: Secondary | ICD-10-CM

## 2020-10-11 DIAGNOSIS — E1122 Type 2 diabetes mellitus with diabetic chronic kidney disease: Secondary | ICD-10-CM | POA: Diagnosis present

## 2020-10-11 DIAGNOSIS — N186 End stage renal disease: Secondary | ICD-10-CM

## 2020-10-11 DIAGNOSIS — D649 Anemia, unspecified: Secondary | ICD-10-CM | POA: Diagnosis not present

## 2020-10-11 DIAGNOSIS — Z992 Dependence on renal dialysis: Secondary | ICD-10-CM | POA: Diagnosis not present

## 2020-10-11 LAB — CBC WITH DIFFERENTIAL/PLATELET
Abs Immature Granulocytes: 0.06 10*3/uL (ref 0.00–0.07)
Basophils Absolute: 0 10*3/uL (ref 0.0–0.1)
Basophils Relative: 0 %
Eosinophils Absolute: 0.4 10*3/uL (ref 0.0–0.5)
Eosinophils Relative: 7 %
HCT: 22.7 % — ABNORMAL LOW (ref 36.0–46.0)
Hemoglobin: 7.1 g/dL — ABNORMAL LOW (ref 12.0–15.0)
Immature Granulocytes: 1 %
Lymphocytes Relative: 11 %
Lymphs Abs: 0.7 10*3/uL (ref 0.7–4.0)
MCH: 31.4 pg (ref 26.0–34.0)
MCHC: 31.3 g/dL (ref 30.0–36.0)
MCV: 100.4 fL — ABNORMAL HIGH (ref 80.0–100.0)
Monocytes Absolute: 0.3 10*3/uL (ref 0.1–1.0)
Monocytes Relative: 5 %
Neutro Abs: 4.7 10*3/uL (ref 1.7–7.7)
Neutrophils Relative %: 76 %
Platelets: 142 10*3/uL — ABNORMAL LOW (ref 150–400)
RBC: 2.26 MIL/uL — ABNORMAL LOW (ref 3.87–5.11)
RDW: 15.8 % — ABNORMAL HIGH (ref 11.5–15.5)
WBC: 6.1 10*3/uL (ref 4.0–10.5)
nRBC: 0 % (ref 0.0–0.2)

## 2020-10-11 LAB — COMPREHENSIVE METABOLIC PANEL
ALT: 10 U/L (ref 0–44)
AST: 15 U/L (ref 15–41)
Albumin: 3.8 g/dL (ref 3.5–5.0)
Alkaline Phosphatase: 74 U/L (ref 38–126)
Anion gap: 13 (ref 5–15)
BUN: 39 mg/dL — ABNORMAL HIGH (ref 8–23)
CO2: 31 mmol/L (ref 22–32)
Calcium: 8.9 mg/dL (ref 8.9–10.3)
Chloride: 95 mmol/L — ABNORMAL LOW (ref 98–111)
Creatinine, Ser: 6.02 mg/dL — ABNORMAL HIGH (ref 0.44–1.00)
GFR, Estimated: 7 mL/min — ABNORMAL LOW (ref >60)
Glucose, Bld: 107 mg/dL — ABNORMAL HIGH (ref 70–99)
Potassium: 4.5 mmol/L (ref 3.5–5.1)
Sodium: 139 mmol/L (ref 135–145)
Total Bilirubin: 1.2 mg/dL (ref 0.3–1.2)
Total Protein: 6.6 g/dL (ref 6.5–8.1)

## 2020-10-11 LAB — IRON AND TIBC
Iron: 33 ug/dL (ref 28–170)
Saturation Ratios: 14 % (ref 10.4–31.8)
TIBC: 230 ug/dL — ABNORMAL LOW (ref 250–450)
UIBC: 197 ug/dL

## 2020-10-11 LAB — FOLATE: Folate: 11.8 ng/mL (ref >5.9)

## 2020-10-11 LAB — RETICULOCYTES
Immature Retic Fract: 23 % — ABNORMAL HIGH (ref 2.3–15.9)
RBC.: 2.29 MIL/uL — ABNORMAL LOW (ref 3.87–5.11)
Retic Count, Absolute: 77.2 10*3/uL (ref 19.0–186.0)
Retic Ct Pct: 3.4 % — ABNORMAL HIGH (ref 0.4–3.1)

## 2020-10-11 LAB — TECHNOLOGIST SMEAR REVIEW
Plt Morphology: NORMAL
RBC Morphology: NORMAL
WBC Morphology: NORMAL

## 2020-10-11 LAB — C-REACTIVE PROTEIN: CRP: 0.5 mg/dL (ref <1.0)

## 2020-10-11 LAB — PREPARE RBC (CROSSMATCH)

## 2020-10-11 LAB — LACTATE DEHYDROGENASE: LDH: 138 U/L (ref 98–192)

## 2020-10-11 LAB — FERRITIN: Ferritin: 804 ng/mL — ABNORMAL HIGH (ref 11–307)

## 2020-10-11 LAB — SAMPLE TO BLOOD BANK

## 2020-10-11 LAB — VITAMIN B12: Vitamin B-12: 293 pg/mL (ref 180–914)

## 2020-10-11 NOTE — Progress Notes (Signed)
[facility] NOTE  Patient Care Team: [name], MD as PCP - General (Internal Medicine) Minna Merritts, MD as PCP - Cardiology (Cardiology) Minna Merritts, MD as Consulting Physician (Cardiology) Anthonette Legato, MD (Internal Medicine) Alisa Graff, FNP as Nurse Practitioner (Family Medicine) Jason Coop, NP as Nurse Practitioner  CHIEF COMPLAINTS/PURPOSE OF CONSULTATION:    HEMATOLOGY HISTORY   # ANEMIA EGD-; colonoscopy- ; capsule-? Bone marrow Biopsy-?  ESRD- on HD Dr.lateef [[place]]...   HISTORY OF PRESENTING ILLNESS:  Phyllis [name] 74 y.o.  female COPD/CHF on 2 L home O2; and ESRD on hemodialysis has been referred to [place] for further evaluation/work-up for anemia.  Patient was sent from the dialysis center the emergency room for hemoglobin of 7; when she needed a PRBC transfusion the ER.   Blood in stools: None/colo > 6 years Change in bowel habits- None Blood in urine: None; ESR on HD Difficulty swallowing: None Abnormal weight loss: None Iron supplementation: Patient thinks she has been getting IV iron infusion/Procritit with HD [Tues/Thurs/Sat] Prior Blood transfusions: yes. Vaginal bleeding: None   Review of Systems  Constitutional: Positive for malaise/fatigue. Negative for chills, diaphoresis, fever and weight loss.  HENT: Negative for nosebleeds and sore throat.   Eyes: Negative for double vision.  Respiratory: Positive for shortness of breath. Negative for cough, hemoptysis, sputum production and wheezing.   Cardiovascular: Negative for chest pain, palpitations, orthopnea and leg swelling.  Gastrointestinal: Negative for abdominal pain, blood in stool, constipation, diarrhea, heartburn, melena, nausea and vomiting.  Genitourinary: Negative for dysuria, frequency and urgency.  Musculoskeletal: Positive for back pain and joint pain.  Skin: Negative.  Negative for itching and rash.  Neurological: Negative  for dizziness, tingling, focal weakness, weakness and headaches.  Endo/Heme/Allergies: Does not bruise/bleed easily.  Psychiatric/Behavioral: Negative for depression. The patient is not nervous/anxious and does not have insomnia.     MEDICAL HISTORY:  Past Medical History:  Diagnosis Date  . (HFpEF) heart failure with preserved ejection fraction ([place])    a. TTE 01/2014: nl LV sys fxn, no valvular abnormalities; b. TTE 11/16: nl EF, mild LVH;  c. 04/2019 Echo: EF 60-65%. DD. Nl RV fxn. Mod AS. 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  . Allergy   . Anemia of chronic disease   . Anxiety   . Aortic atherosclerosis ([place])   . Asthma   . Chronic back pain   . COPD (chronic obstructive pulmonary disease) ([place])   . Diabetes mellitus with complication ([place])   . ESRD on hemodialysis ([place])    a. Tues/Sat; b. 2/2 small kidneys  . Essential hypertension   . Fistula    lower left arm  . GERD (gastroesophageal reflux disease)   . Gout   . History of exercise stress test    a. 01/2014: no evidence of ischemia; b. Lexiscan 08/2015: no sig ischemia, severe GI uptake artifact, low risk; c. CPET @ [facility] 09/2016: exercised 3 min 12 sec on bike without incline, 2.28 METs, VO2 of 8.1, 48% of predicted, indicating mod to sev functional impairment, evidence of blunted HR, stroke volume, and BP augmentation as well as ventilation-perfusion mismatch with exercise  . HLD (hyperlipidemia)   . Non-obstructive Carotid arterial disease ([place])    a. 12/2017: <50% bilat ICA dzs.  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 Severe aortic stenosis    a. by 09/2019 echo b. 04/2019 Echo: Mod AS.  Marland Kitchen  Sleep apnea     SURGICAL HISTORY: Past Surgical History:  Procedure Laterality Date  . CARDIAC CATHETERIZATION    . carpel tunnel    . GALLBLADDER SURGERY    . PERIPHERAL VASCULAR CATHETERIZATION N/A 04/12/2015   Procedure: A/V Shuntogram/Fistulagram;  Surgeon: Algernon Huxley, MD;  Location: [facility];  Service: Cardiovascular;  Laterality: N/A;  . PERIPHERAL VASCULAR CATHETERIZATION N/A 04/12/2015   Procedure: A/V Shunt Intervention;  Surgeon: Algernon Huxley, MD;  Location: [facility];  Service: Cardiovascular;  Laterality: N/A;  . PERIPHERAL VASCULAR CATHETERIZATION N/A 06/09/2015   Procedure: Dialysis/Perma Catheter Removal;  Surgeon: Katha Cabal, MD;  Location: [facility];  Service: Cardiovascular;  Laterality: N/A;  . RIGHT/LEFT HEART CATH AND CORONARY ANGIOGRAPHY N/A 10/14/2019   Procedure: RIGHT/LEFT HEART CATH AND CORONARY ANGIOGRAPHY;  Surgeon: Belva Crome, MD;  Location: [facility];  Service: Cardiovascular;  Laterality: N/A;    SOCIAL HISTORY: Social History   Socioeconomic History  . Marital status: Single    Spouse name: Not on file  . Number of children: Not on file  . Years of education: Not on file  . Highest education level: Not on file  Occupational History  . Not on file  Tobacco Use  . Smoking status: Never Smoker  . Smokeless tobacco: Never Used  Vaping Use  . Vaping Use: Never used  Substance and Sexual Activity  . Alcohol use: No  . Drug use: No  . Sexual activity: Not Currently  Other Topics Concern  . 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 Taught school. Nephew in next relative in [place].    Social Determinants of Health   Financial Resource Strain:   . Difficulty of Paying Living Expenses: Not on file  Food Insecurity:   . Worried About Charity fundraiser in the Last Year: Not on file  . Ran Out of Food in the Last Year: Not on file  Transportation Needs:   . Lack of Transportation (Medical): Not on file  . Lack of Transportation (Non-Medical): Not on file  Physical Activity:   . Days of Exercise per Week: Not on file  . Minutes of Exercise per Session: Not on file  Stress:   . Feeling of Stress : Not on file   Social Connections:   . Frequency of Communication with Friends and Family: Not on file  . Frequency of Social Gatherings with Friends and Family: Not on file  . Attends Religious Services: Not on file  . Active Member of Clubs or Organizations: Not on file  . Attends Archivist Meetings: Not on file  . Marital Status: Not on file  Intimate Partner Violence:   . Fear of Current or Ex-Partner: Not on file  . Emotionally Abused: Not on file  . Physically Abused: Not on file  . Sexually Abused: Not on file    FAMILY HISTORY: Family History  Problem Relation Age of Onset  . Hypertension Mother   . Hyperlipidemia Mother   . Heart disease Father   . Heart attack Father 54  . Hypertension Father   . Hyperlipidemia Father   . Heart disease Brother        CABG   . Heart attack Brother   . Breast cancer Neg Hx     ALLERGIES:  is allergic to enalapril maleate; nitrofurantoin; sulfamethoxazole-trimethoprim; 2,4-d  dimethylamine (amisol); baclofen; bactrim [sulfamethoxazole-trimethoprim]; macrodantin [nitrofurantoin macrocrystal]; neosporin [neomycin-bacitracin zn-polymyx]; quinine; quinine derivatives; ultram [tramadol]; zocor [simvastatin]; and levodopa.  MEDICATIONS:  Current Outpatient Medications  Medication Sig Dispense Refill  . acetaminophen (TYLENOL) 500 MG tablet Take 500 mg by mouth every 6 (six) hours as needed.    Marland Kitchen albuterol (PROVENTIL HFA;VENTOLIN HFA) 108 (90 Base) MCG/ACT inhaler Inhale 2 puffs into the lungs every 6 (six) hours as needed for wheezing or shortness of breath. Out of medication    . aspirin (ASPIRIN 81) 81 MG EC tablet Take 1 tablet by mouth daily.     . busPIRone (BUSPAR) 5 MG tablet Take 5 mg by mouth 2 (two) times daily.    . carvedilol (COREG) 3.125 MG tablet Take 1 tablet (3.125 mg total) by mouth 2 (two) times daily with a meal. 60 tablet 0  . cyclobenzaprine (FLEXERIL) 10 MG tablet Take 10 mg by mouth daily as needed for muscle spasms.    Marland Kitchen  epoetin alfa (EPOGEN) 10000 UNIT/ML injection Inject 0.4 mLs (4,000 Units total) into the vein Every Tuesday,Thursday,and Saturday with dialysis. 1 mL   . HYDROcodone-acetaminophen (NORCO) 7.5-325 MG tablet Take 1 tablet by mouth every 6 (six) hours as needed (pain). 12 tablet 0  . ipratropium-albuterol (DUONEB) 0.5-2.5 (3) MG/3ML SOLN Take 3 mLs by nebulization every 6 (six) hours as needed. 360 mL   . isosorbide mononitrate (IMDUR) 60 MG 24 hr tablet Take 1 tablet (60 mg total) by mouth daily. 30 tablet 1  . lidocaine (LIDODERM) 5 % Place 1 patch onto the skin daily. Remove & Discard patch within 12 hours or as directed by MD 30 patch 0  . lidocaine-prilocaine (EMLA) cream Apply 1 application topically as needed (prior to treatment).     . Melatonin 5 MG CHEW Chew 1 tablet by mouth at bedtime.    . mirtazapine (REMERON) 15 MG tablet Take 15 mg by mouth at bedtime.    . nitroGLYCERIN (NITROSTAT) 0.4 MG SL tablet Place 1 tablet (0.4 mg total) under the tongue every 5 (five) minutes as needed for chest pain. 25 tablet 3  . oxybutynin (DITROPAN XL) 15 MG 24 hr tablet Take 15 mg by mouth daily.    . pravastatin (PRAVACHOL) 40 MG tablet Take 40 mg by mouth at bedtime.     . senna-docusate (SENOKOT-S) 8.6-50 MG tablet Take 1 tablet by mouth daily.    . sertraline (ZOLOFT) 50 MG tablet Take 50 mg by mouth at bedtime.    . torsemide (DEMADEX) 20 MG tablet Take by mouth.     No current facility-administered medications for this visit.      PHYSICAL EXAMINATION:   Vitals:   10/11/20 1117  BP: (!) 114/47  Pulse: 81  Resp: 16  Temp: (!) 96.8 F (36 C)  SpO2: 100%   Filed Weights   10/11/20 1117  Weight: 132 lb (59.9 kg)    Physical Exam HENT:     Head: Normocephalic and atraumatic.     Mouth/Throat:     Pharynx: No oropharyngeal exudate.  Eyes:     Pupils: Pupils are equal, round, and reactive to light.  Cardiovascular:     Rate and Rhythm: Normal rate and regular rhythm.   Pulmonary:     Effort: No respiratory distress.     Breath sounds: No wheezing.  Abdominal:     General: Bowel sounds are normal. There is no distension.     Palpations: Abdomen is soft. There is no mass.  Tenderness: There is no abdominal tenderness. There is no guarding or rebound.  Musculoskeletal:        General: No tenderness. Normal range of motion.     Cervical back: Normal range of motion and neck supple.  Skin:    General: Skin is warm.  Neurological:     Mental Status: She is alert and oriented to person, place, and time.  Psychiatric:        Mood and Affect: Affect normal.     LABORATORY DATA:  I have reviewed the data as listed Lab Results  Component Value Date   WBC 6.1 10/11/2020   HGB 7.1 (L) 10/11/2020   HCT 22.7 (L) 10/11/2020   MCV 100.4 (H) 10/11/2020   PLT 142 (L) 10/11/2020   Recent Labs    04/28/20 1622 04/29/20 0556 04/29/20 0556 04/30/20 0641 08/05/20 5170 08/05/20 0174 08/28/20 0958 08/29/20 0440 09/02/20 0552 09/30/20 1541 10/11/20 1206  NA   < > 138   < > 138 141   < > 130*   < > 137 135 139  K   < > 4.9   < > 4.5 5.7*   < > 4.8   < > 4.5 3.7 4.5  CL   < > 99   < > 98 95*   < > 90*   < > 94* 90* 95*  CO2   < > 28   < > 27 34*   < > 25   < > _0 GLUCOSE   < > 79   < > 77 96   < > 182*   < > 109* 151* 107*  BUN   < > 60*   < > 23 41*   < > 66*   < > 65* 17 39*  CREATININE   < > 6.57*   < > 4.19* 5.36*   < > 7.00*   < > 5.51* 3.30* 6.02*  CALCIUM   < > 8.2*   < > 8.6* 9.0   < > 8.5*   < > 8.8* 8.6* 8.9  GFRNONAA   < > 6*   < > 10* 7*  --  5*   < > 7* 14* 7*  GFRAA  --  7*  --  11* 8*  --   --   --   --   --   --   PROT  --   --   --   --  6.5  --  7.0  --   --   --  6.6  ALBUMIN   < >  --   --   --  3.9  --  4.1  --   --   --  3.8  AST  --   --   --   --  18  --  17  --   --   --  15  ALT  --   --   --   --  13  --  12  --   --   --  10  ALKPHOS  --   --   --   --  127*  --  118  --   --   --  74  BILITOT  --   --   --   --   0.7  --  0.8  --   --   --  1.2   < > = values in this interval not displayed.  No results found.  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-stage renal disease) ([place]) #Severe symptomatic anemia-end-stage renal disease [etiology is unclear]; patient is symptomatic.  Hemoglobin today 7.1.  We will proceed with PRBC transfusion.  #Etiology of the anemia is unclear.  ESRD; [however on ? Procrit/iron; Dr.Lateef].  Rule out other causes like myeloma hemolysis.   #ESRD-dialysis Tuesday Thursday Saturday [Dr.Lateef].   Thank you Dr.Kinner for allowing me to participate in the care of your pleasant patient. Please do not hesitate to contact me with questions or concerns in the interim. Will discuss with Dr.Lateef.   # DISPOSITION:[id number] # labs today- cbc/cmp; hold tube; reticulocyte count; peripheral smear; LDH; haptoglobin. # Follow up TBD- Dr.B   All questions were answered. The patient knows to call the clinic with any problems, questions or concerns.   Cammie Sickle, MD 10/11/2020 4:35 PM

## 2020-10-11 NOTE — Assessment & Plan Note (Addendum)
#  Severe symptomatic anemia-end-stage renal disease [etiology is unclear]; patient is symptomatic.  Hemoglobin today 7.1.  We will proceed with PRBC transfusion.  #Etiology of the anemia is unclear.  ESRD; [however on ? Procrit/iron; Dr.Lateef].  Rule out other causes like myeloma hemolysis.   #ESRD-dialysis Tuesday Thursday Saturday [Dr.Lateef].   Thank you Dr.Kinner for allowing me to participate in the care of your pleasant patient. Please do not hesitate to contact me with questions or concerns in the interim. Will discuss with Dr.Lateef.   # DISPOSITION:[phone] # labs today- cbc/cmp; hold tube; reticulocyte count; peripheral smear; LDH; haptoglobin. # Follow up TBD- Dr.B

## 2020-10-11 NOTE — Telephone Encounter (Signed)
Hb- 7.1; Heather RN spoke to pt; agreeable for 1 unit PRBC transfusion. Will order.  Thanks GB

## 2020-10-12 ENCOUNTER — Inpatient Hospital Stay: Payer: Medicare Other

## 2020-10-12 DIAGNOSIS — D631 Anemia in chronic kidney disease: Secondary | ICD-10-CM

## 2020-10-12 DIAGNOSIS — E1122 Type 2 diabetes mellitus with diabetic chronic kidney disease: Secondary | ICD-10-CM | POA: Diagnosis not present

## 2020-10-12 DIAGNOSIS — D649 Anemia, unspecified: Secondary | ICD-10-CM

## 2020-10-12 LAB — ERYTHROPOIETIN: Erythropoietin: 11.8 m[IU]/mL (ref 2.6–18.5)

## 2020-10-12 LAB — HAPTOGLOBIN: Haptoglobin: 107 mg/dL (ref 42–346)

## 2020-10-12 MED ORDER — SODIUM CHLORIDE 0.9% IV SOLUTION
250.0000 mL | Freq: Once | INTRAVENOUS | Status: AC
  Filled 2020-10-12: qty 250

## 2020-10-12 MED ORDER — DIPHENHYDRAMINE HCL 25 MG PO CAPS
25.0000 mg | ORAL_CAPSULE | Freq: Once | ORAL | Status: AC
  Administered 2020-10-12: 25 mg via ORAL
  Filled 2020-10-12: qty 1

## 2020-10-12 MED ORDER — ACETAMINOPHEN 325 MG PO TABS
650.0000 mg | ORAL_TABLET | Freq: Once | ORAL | Status: AC
  Administered 2020-10-12: 650 mg via ORAL
  Filled 2020-10-12: qty 2

## 2020-10-12 MED ORDER — SODIUM CHLORIDE 0.9% IV SOLUTION
250.0000 mL | Freq: Once | INTRAVENOUS | Status: AC
  Administered 2020-10-12: 250 mL via INTRAVENOUS
  Filled 2020-10-12: qty 250

## 2020-10-12 NOTE — Progress Notes (Signed)
Patient stable at discharge.

## 2020-10-13 ENCOUNTER — Telehealth: Payer: Self-pay | Admitting: Internal Medicine

## 2020-10-13 DIAGNOSIS — D649 Anemia, unspecified: Secondary | ICD-10-CM

## 2020-10-13 DIAGNOSIS — D631 Anemia in chronic kidney disease: Secondary | ICD-10-CM

## 2020-10-13 LAB — BPAM RBC
Blood Product Expiration Date: 202112252359
ISSUE DATE / TIME: 202111230905
Unit Type and Rh: 5100

## 2020-10-13 LAB — TYPE AND SCREEN
ABO/RH(D): O POS
Antibody Screen: NEGATIVE
Donor AG Type: NEGATIVE
Unit division: 0

## 2020-10-13 NOTE — Telephone Encounter (Signed)
Phyllis [name]/Dr. B. The last time I had to reach the patient. I had to contact her emergency contact/friend Phyllis [name] [phone] to get ahold of the patient. Phyllis [name] is her HPOA. Phyllis [name] will go to the patient's house and let her know to call our office.

## 2020-10-13 NOTE — Telephone Encounter (Signed)
On 11/23- I tried to reach pt re: my discussion with nephrology [facility] Procrit/iron]. Look for other causes of anemia.  Recommend bone marrow biopsy/CT imaging.will discuss at next visit.   Phone rings/cannot leave VM.   Please have pt follow up in week of Dec 6th- MD; labs- CBC; possible 1 unit PRBC transfusion. GB

## 2020-10-13 NOTE — Addendum Note (Signed)
Addended by: Gloris Ham on: 10/13/2020 10:37 AM   Modules accepted: Orders

## 2020-10-18 NOTE — Telephone Encounter (Signed)
Colette, did we reach the patient?

## 2020-10-19 ENCOUNTER — Telehealth: Payer: Self-pay | Admitting: Internal Medicine

## 2020-10-19 NOTE — Telephone Encounter (Signed)
10/19/2020 Returned pts call from 11/29 regarding upcoming appt. Informed her appts are on 10/27/20 @ 8:45 SRW

## 2020-10-19 NOTE — Telephone Encounter (Signed)
Patient was reached and knows about the apt per [facility]

## 2020-10-23 ENCOUNTER — Other Ambulatory Visit: Payer: Self-pay | Admitting: Obstetrics and Gynecology

## 2020-10-25 ENCOUNTER — Ambulatory Visit: Payer: Medicare Other | Admitting: Cardiovascular Disease

## 2020-10-27 ENCOUNTER — Other Ambulatory Visit: Payer: Self-pay

## 2020-10-27 ENCOUNTER — Inpatient Hospital Stay: Payer: Medicare Other | Attending: Internal Medicine

## 2020-10-27 ENCOUNTER — Inpatient Hospital Stay (HOSPITAL_BASED_OUTPATIENT_CLINIC_OR_DEPARTMENT_OTHER): Payer: Medicare Other | Admitting: Internal Medicine

## 2020-10-27 ENCOUNTER — Inpatient Hospital Stay: Payer: Medicare Other

## 2020-10-27 ENCOUNTER — Encounter: Payer: Self-pay | Admitting: Internal Medicine

## 2020-10-27 VITALS — BP 132/45 | HR 69 | Temp 97.2°F | Resp 18 | Ht <= 58 in | Wt 132.0 lb

## 2020-10-27 DIAGNOSIS — I132 Hypertensive heart and chronic kidney disease with heart failure and with stage 5 chronic kidney disease, or end stage renal disease: Secondary | ICD-10-CM | POA: Insufficient documentation

## 2020-10-27 DIAGNOSIS — D649 Anemia, unspecified: Secondary | ICD-10-CM

## 2020-10-27 DIAGNOSIS — Z992 Dependence on renal dialysis: Secondary | ICD-10-CM

## 2020-10-27 DIAGNOSIS — D631 Anemia in chronic kidney disease: Secondary | ICD-10-CM | POA: Insufficient documentation

## 2020-10-27 DIAGNOSIS — R634 Abnormal weight loss: Secondary | ICD-10-CM | POA: Diagnosis not present

## 2020-10-27 DIAGNOSIS — Z79899 Other long term (current) drug therapy: Secondary | ICD-10-CM | POA: Diagnosis not present

## 2020-10-27 DIAGNOSIS — N186 End stage renal disease: Secondary | ICD-10-CM

## 2020-10-27 DIAGNOSIS — E1122 Type 2 diabetes mellitus with diabetic chronic kidney disease: Secondary | ICD-10-CM | POA: Insufficient documentation

## 2020-10-27 LAB — CBC WITH DIFFERENTIAL/PLATELET
Abs Immature Granulocytes: 0.01 10*3/uL (ref 0.00–0.07)
Basophils Absolute: 0 10*3/uL (ref 0.0–0.1)
Basophils Relative: 1 %
Eosinophils Absolute: 0.6 10*3/uL — ABNORMAL HIGH (ref 0.0–0.5)
Eosinophils Relative: 11 %
HCT: 32.6 % — ABNORMAL LOW (ref 36.0–46.0)
Hemoglobin: 10.2 g/dL — ABNORMAL LOW (ref 12.0–15.0)
Immature Granulocytes: 0 %
Lymphocytes Relative: 9 %
Lymphs Abs: 0.5 10*3/uL — ABNORMAL LOW (ref 0.7–4.0)
MCH: 31.2 pg (ref 26.0–34.0)
MCHC: 31.3 g/dL (ref 30.0–36.0)
MCV: 99.7 fL (ref 80.0–100.0)
Monocytes Absolute: 0.3 10*3/uL (ref 0.1–1.0)
Monocytes Relative: 6 %
Neutro Abs: 3.9 10*3/uL (ref 1.7–7.7)
Neutrophils Relative %: 73 %
Platelets: 132 10*3/uL — ABNORMAL LOW (ref 150–400)
RBC: 3.27 MIL/uL — ABNORMAL LOW (ref 3.87–5.11)
RDW: 14 % (ref 11.5–15.5)
WBC: 5.3 10*3/uL (ref 4.0–10.5)
nRBC: 0 % (ref 0.0–0.2)

## 2020-10-27 LAB — SAMPLE TO BLOOD BANK

## 2020-10-27 NOTE — Progress Notes (Signed)
[facility] CONSULT NOTE  Patient Care Team: [name], MD as PCP - General (Internal Medicine) Minna Merritts, MD as PCP - Cardiology (Cardiology) Minna Merritts, MD as Consulting Physician (Cardiology) Anthonette Legato, MD (Internal Medicine) Alisa Graff, FNP as Nurse Practitioner (Family Medicine) Jason Coop, NP as Nurse Practitioner  CHIEF COMPLAINTS/PURPOSE OF CONSULTATION: severe anemia.    HEMATOLOGY HISTORY   # ANEMIA Nadir - 7.1 [NOV 2021; while on Procrit/IV iron]EGD-; colonoscopy > 6 years ago;   # ESRD- on HD Dr.lateef [[place]- on procrit/IV iron as neprho/HD-Tuesday/Thurs/Sat]; chronic respiratory failure  HISTORY OF PRESENTING ILLNESS:  Phyllis [name] 74 y.o.  female COPD/CHF on 2 L home O2; and ESRD on hemodialysis noted to have severe anemia with hemoglobin nadir 7; needing PRBC transfusion.  As per nephrology patient is on Procrit/IV iron.  Patient status post PRBC transfusion last week.  Today hemoglobin is 10.  Patient denies any blood in stools or black or stools.  No recent colonoscopy.   Review of Systems  Constitutional: Positive for malaise/fatigue. Negative for chills, diaphoresis, fever and weight loss.  HENT: Negative for nosebleeds and sore throat.   Eyes: Negative for double vision.  Respiratory: Positive for shortness of breath. Negative for cough, hemoptysis, sputum production and wheezing.   Cardiovascular: Negative for chest pain, palpitations, orthopnea and leg swelling.  Gastrointestinal: Negative for abdominal pain, blood in stool, constipation, diarrhea, heartburn, melena, nausea and vomiting.  Genitourinary: Negative for dysuria, frequency and urgency.  Musculoskeletal: Positive for back pain and joint pain.  Skin: Negative.  Negative for itching and rash.  Neurological: Negative for dizziness, tingling, focal weakness, weakness and headaches.  Endo/Heme/Allergies: Does not bruise/bleed  easily.  Psychiatric/Behavioral: Negative for depression. The patient is not nervous/anxious and does not have insomnia.     MEDICAL HISTORY:  Past Medical History:  Diagnosis Date  . (HFpEF) heart failure with preserved ejection fraction ([place])    a. TTE 01/2014: nl LV sys fxn, no valvular abnormalities; b. TTE 11/16: nl EF, mild LVH;  c. 04/2019 Echo: EF 60-65%. DD. Nl RV fxn. Mod AS. 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  . Allergy   . Anemia of chronic disease   . Anxiety   . Aortic atherosclerosis ([place])   . Asthma   . Chronic back pain   . COPD (chronic obstructive pulmonary disease) ([place])   . Diabetes mellitus with complication ([place])   . ESRD on hemodialysis ([place])    a. Tues/Sat; b. 2/2 small kidneys  . Essential hypertension   . Fistula    lower left arm  . GERD (gastroesophageal reflux disease)   . Gout   . History of exercise stress test    a. 01/2014: no evidence of ischemia; b. Lexiscan 08/2015: no sig ischemia, severe GI uptake artifact, low risk; c. CPET @ [facility] 09/2016: exercised 3 min 12 sec on bike without incline, 2.28 METs, VO2 of 8.1, 48% of predicted, indicating mod to sev functional impairment, evidence of blunted HR, stroke volume, and BP augmentation as well as ventilation-perfusion mismatch with exercise  . HLD (hyperlipidemia)   . Non-obstructive Carotid arterial disease ([place])    a. 12/2017: <50% bilat ICA dzs.  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 Severe aortic stenosis    a. by 09/2019 echo b. 04/2019 Echo: Mod AS.  Marland Kitchen Sleep apnea     SURGICAL HISTORY: Past Surgical History:  Procedure Laterality  Date  . CARDIAC CATHETERIZATION    . carpel tunnel    . GALLBLADDER SURGERY    . PERIPHERAL VASCULAR CATHETERIZATION N/A 04/12/2015   Procedure: A/V Shuntogram/Fistulagram;  Surgeon: Algernon Huxley, MD;  Location: [facility];  Service: Cardiovascular;  Laterality: N/A;  . PERIPHERAL VASCULAR CATHETERIZATION N/A  04/12/2015   Procedure: A/V Shunt Intervention;  Surgeon: Algernon Huxley, MD;  Location: [facility];  Service: Cardiovascular;  Laterality: N/A;  . PERIPHERAL VASCULAR CATHETERIZATION N/A 06/09/2015   Procedure: Dialysis/Perma Catheter Removal;  Surgeon: Katha Cabal, MD;  Location: [facility];  Service: Cardiovascular;  Laterality: N/A;  . RIGHT/LEFT HEART CATH AND CORONARY ANGIOGRAPHY N/A 10/14/2019   Procedure: RIGHT/LEFT HEART CATH AND CORONARY ANGIOGRAPHY;  Surgeon: Belva Crome, MD;  Location: [facility];  Service: Cardiovascular;  Laterality: N/A;    SOCIAL HISTORY: Social History   Socioeconomic History  . Marital status: Single    Spouse name: Not on file  . Number of children: Not on file  . Years of education: Not on file  . Highest education level: Not on file  Occupational History  . Not on file  Tobacco Use  . Smoking status: Never Smoker  . Smokeless tobacco: Never Used  Vaping Use  . Vaping Use: Never used  Substance and Sexual Activity  . Alcohol use: No  . Drug use: No  . Sexual activity: Not Currently  Other Topics Concern  . 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 Taught school. Nephew in next relative in [place].    Social Determinants of Health   Financial Resource Strain:   . Difficulty of Paying Living Expenses: Not on file  Food Insecurity:   . Worried About Charity fundraiser in the Last Year: Not on file  . Ran Out of Food in the Last Year: Not on file  Transportation Needs:   . Lack of Transportation (Medical): Not on file  . Lack of Transportation (Non-Medical): Not on file  Physical Activity:   . Days of Exercise per Week: Not on file  . Minutes of Exercise per Session: Not on file  Stress:   . Feeling of Stress : Not on file  Social Connections:   . Frequency of Communication with Friends and Family: Not on file  . Frequency of  Social Gatherings with Friends and Family: Not on file  . Attends Religious Services: Not on file  . Active Member of Clubs or Organizations: Not on file  . Attends Archivist Meetings: Not on file  . Marital Status: Not on file  Intimate Partner Violence:   . Fear of Current or Ex-Partner: Not on file  . Emotionally Abused: Not on file  . Physically Abused: Not on file  . Sexually Abused: Not on file    FAMILY HISTORY: Family History  Problem Relation Age of Onset  . Hypertension Mother   . Hyperlipidemia Mother   . Heart disease Father   . Heart attack Father 41  . Hypertension Father   . Hyperlipidemia Father   . Heart disease Brother        CABG   . Heart attack Brother   . Breast cancer Neg Hx     ALLERGIES:  is allergic to enalapril maleate; nitrofurantoin; sulfamethoxazole-trimethoprim; 2,4-d dimethylamine (amisol); baclofen; bactrim [sulfamethoxazole-trimethoprim]; macrodantin [nitrofurantoin macrocrystal]; neosporin [neomycin-bacitracin zn-polymyx]; quinine; quinine derivatives;  ultram [tramadol]; zocor [simvastatin]; and levodopa.  MEDICATIONS:  Current Outpatient Medications  Medication Sig Dispense Refill  . acetaminophen (TYLENOL) 500 MG tablet Take 500 mg by mouth every 6 (six) hours as needed.    Marland Kitchen albuterol (PROVENTIL HFA;VENTOLIN HFA) 108 (90 Base) MCG/ACT inhaler Inhale 2 puffs into the lungs every 6 (six) hours as needed for wheezing or shortness of breath. Out of medication    . aspirin (ASPIRIN 81) 81 MG EC tablet Take 1 tablet by mouth daily.     . busPIRone (BUSPAR) 5 MG tablet Take 5 mg by mouth 2 (two) times daily.    . carvedilol (COREG) 3.125 MG tablet Take 1 tablet (3.125 mg total) by mouth 2 (two) times daily with a meal. 60 tablet 0  . cetirizine (ZYRTEC) 10 MG tablet Take 10 mg by mouth daily.    . cyclobenzaprine (FLEXERIL) 10 MG tablet Take 10 mg by mouth daily as needed for muscle spasms.    Marland Kitchen epoetin alfa (EPOGEN) 10000 UNIT/ML  injection Inject 0.4 mLs (4,000 Units total) into the vein Every Tuesday,Thursday,and Saturday with dialysis. 1 mL   . HYDROcodone-acetaminophen (NORCO) 7.5-325 MG tablet Take 1 tablet by mouth every 6 (six) hours as needed (pain). 12 tablet 0  . ipratropium-albuterol (DUONEB) 0.5-2.5 (3) MG/3ML SOLN Take 3 mLs by nebulization every 6 (six) hours as needed. 360 mL   . isosorbide mononitrate (IMDUR) 60 MG 24 hr tablet Take 1 tablet (60 mg total) by mouth daily. 30 tablet 1  . lidocaine (LIDODERM) 5 % Place 1 patch onto the skin daily. Remove & Discard patch within 12 hours or as directed by MD 30 patch 0  . lidocaine-prilocaine (EMLA) cream Apply 1 application topically as needed (prior to treatment).     . losartan (COZAAR) 50 MG tablet Take 50 mg by mouth daily.    . Melatonin 5 MG CHEW Chew 1 tablet by mouth at bedtime.    . mirtazapine (REMERON) 15 MG tablet Take 15 mg by mouth at bedtime.    . nitroGLYCERIN (NITROSTAT) 0.4 MG SL tablet Place 1 tablet (0.4 mg total) under the tongue every 5 (five) minutes as needed for chest pain. 25 tablet 3  . oxybutynin (DITROPAN XL) 15 MG 24 hr tablet Take 15 mg by mouth daily.    . pravastatin (PRAVACHOL) 40 MG tablet Take 40 mg by mouth at bedtime.     . senna-docusate (SENOKOT-S) 8.6-50 MG tablet Take 1 tablet by mouth daily.    . sertraline (ZOLOFT) 50 MG tablet Take 50 mg by mouth at bedtime.    . torsemide (DEMADEX) 20 MG tablet Take by mouth.     No current facility-administered medications for this visit.      PHYSICAL EXAMINATION:   Vitals:   10/27/20 0921  BP: (!) 132/45  Pulse: 69  Resp: 18  Temp: (!) 97.2 F (36.2 C)  SpO2: 100%   Filed Weights   10/27/20 0921  Weight: 132 lb (59.9 kg)    Physical Exam Constitutional:      Comments: She is in a wheelchair.  On O2 nasal cannula oxygen.  Accompanied by caregiver.  HENT:     Head: Normocephalic and atraumatic.     Mouth/Throat:     Pharynx: No oropharyngeal exudate.  Eyes:      Pupils: Pupils are equal, round, and reactive to light.  Cardiovascular:     Rate and Rhythm: Normal rate and regular rhythm.  Pulmonary:  Effort: No respiratory distress.     Breath sounds: No wheezing.     Comments: Decreased air entry bilaterally. Abdominal:     General: Bowel sounds are normal. There is no distension.     Palpations: Abdomen is soft. There is no mass.     Tenderness: There is no abdominal tenderness. There is no guarding or rebound.  Musculoskeletal:        General: No tenderness. Normal range of motion.     Cervical back: Normal range of motion and neck supple.  Skin:    General: Skin is warm.  Neurological:     Mental Status: She is alert and oriented to person, place, and time.  Psychiatric:        Mood and Affect: Affect normal.     LABORATORY DATA:  I have reviewed the data as listed Lab Results  Component Value Date   WBC 5.3 10/27/2020   HGB 10.2 (L) 10/27/2020   HCT 32.6 (L) 10/27/2020   MCV 99.7 10/27/2020   PLT 132 (L) 10/27/2020   Recent Labs    04/28/20 1622 04/29/20 0556 04/29/20 0556 04/30/20 0641 08/05/20 1027 08/05/20 2536 08/28/20 0958 08/29/20 0440 09/02/20 0552 09/30/20 1541 10/11/20 1206  NA   < > 138   < > 138 141   < > 130*   < > 137 135 139  K   < > 4.9   < > 4.5 5.7*   < > 4.8   < > 4.5 3.7 4.5  CL   < > 99   < > 98 95*   < > 90*   < > 94* 90* 95*  CO2   < > 28   < > 27 34*   < > 25   < > _0 GLUCOSE   < > 79   < > 77 96   < > 182*   < > 109* 151* 107*  BUN   < > 60*   < > 23 41*   < > 66*   < > 65* 17 39*  CREATININE   < > 6.57*   < > 4.19* 5.36*   < > 7.00*   < > 5.51* 3.30* 6.02*  CALCIUM   < > 8.2*   < > 8.6* 9.0   < > 8.5*   < > 8.8* 8.6* 8.9  GFRNONAA   < > 6*   < > 10* 7*  --  5*   < > 7* 14* 7*  GFRAA  --  7*  --  11* 8*  --   --   --   --   --   --   PROT  --   --   --   --  6.5  --  7.0  --   --   --  6.6  ALBUMIN   < >  --   --   --  3.9  --  4.1  --   --   --  3.8  AST  --   --   --   --   18  --  17  --   --   --  15  ALT  --   --   --   --  13  --  12  --   --   --  10  ALKPHOS  --   --   --   --  127*  --  118  --   --   --  74  BILITOT  --   --   --   --  0.7  --  0.8  --   --   --  1.2   < > = values in this interval not displayed.     No results found.  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-stage renal disease) ([place]) #Severe symptomatic anemia-end-stage renal disease [etiology is unclear; on procrit/IV iron as per nephro];  Hemoglobin today 10.2; HOLD PRBC transfusion.  #Etiology of the anemia is unclear.  ESRD; [however on ? Procrit/iron; Dr.Lateef]. No evidence of hemolysis. ? Primary bone marrow process. Declines GI/colonoscopy.  Recommend evaluation with a CT scan abdomen pelvis.  If CT scan negative for any acute process-recommend evaluation with a bone marrow biopsy. Discussed with the patient the bone marrow biopsy and aspiration indication and procedure at length.  Given significant discomfort involved-I would recommend under sedationa/with radiology in the hospital. I discussed the potential complications include-bleeding/trauma and risk of infection; which are fortunately very rare.  Currently await results of the CT scan.  #ESRD-dialysis Tuesday Thursday Saturday [Dr.Lateef].  Discussed with Dr. Zollie Scale regarding IV contrast with the CT scan; in agreement.  # DISPOSITION:[phone] # NO Blood today. # stool cards x3 # CT scan ab/pelvis [[place]/wed/friday]ASAP re: unexplained anemia # Follow up TBD- Dr.B   All questions were answered. The patient knows to call the clinic with any problems, questions or concerns.   Cammie Sickle, MD 10/27/2020 6:56 PM

## 2020-10-27 NOTE — Assessment & Plan Note (Addendum)
#  Severe symptomatic anemia-end-stage renal disease [etiology is unclear; on procrit/IV iron as per nephro];  Hemoglobin today 10.2; HOLD PRBC transfusion.  #Etiology of the anemia is unclear.  ESRD; [however on ? Procrit/iron; Dr.Lateef]. No evidence of hemolysis. ? Primary bone marrow process. Declines GI/colonoscopy.  Recommend evaluation with a CT scan abdomen pelvis.  If CT scan negative for any acute process-recommend evaluation with a bone marrow biopsy. Discussed with the patient the bone marrow biopsy and aspiration indication and procedure at length.  Given significant discomfort involved-I would recommend under sedationa/with radiology in the hospital. I discussed the potential complications include-bleeding/trauma and risk of infection; which are fortunately very rare.  Currently await results of the CT scan.  #ESRD-dialysis Tuesday Thursday Saturday [Dr.Lateef].  Discussed with Dr. Zollie Scale regarding IV contrast with the CT scan; in agreement.  # DISPOSITION:[phone] # NO Blood today. # stool cards x3 # CT scan ab/pelvis [[place]/wed/friday]ASAP re: unexplained anemia # Follow up TBD- Dr.B

## 2020-11-05 ENCOUNTER — Telehealth: Payer: Self-pay

## 2020-11-05 NOTE — Telephone Encounter (Signed)
Volunteer called patient on behalf of Palliative Care and patient is doing well.  

## 2020-11-09 ENCOUNTER — Ambulatory Visit: Admission: RE | Admit: 2020-11-09 | Payer: Medicare Other | Source: Ambulatory Visit

## 2020-11-10 ENCOUNTER — Ambulatory Visit
Admission: RE | Admit: 2020-11-10 | Discharge: 2020-11-10 | Disposition: A | Discharge status: Home / Self Care | Payer: Medicare Other | Source: Ambulatory Visit | Attending: Internal Medicine | Admitting: Internal Medicine

## 2020-11-10 ENCOUNTER — Other Ambulatory Visit: Payer: Self-pay

## 2020-11-10 DIAGNOSIS — R634 Abnormal weight loss: Secondary | ICD-10-CM | POA: Diagnosis not present

## 2020-11-10 MED ORDER — IOHEXOL 300 MG/ML  SOLN
100.0000 mL | Freq: Once | INTRAMUSCULAR | Status: AC | PRN
  Administered 2020-11-10: 13:00:00 100 mL via INTRAVENOUS

## 2020-11-11 ENCOUNTER — Telehealth: Payer: Self-pay | Admitting: Internal Medicine

## 2020-11-11 DIAGNOSIS — D649 Anemia, unspecified: Secondary | ICD-10-CM

## 2020-11-11 DIAGNOSIS — D631 Anemia in chronic kidney disease: Secondary | ICD-10-CM

## 2020-11-11 NOTE — Telephone Encounter (Signed)
x

## 2020-11-11 NOTE — Telephone Encounter (Signed)
On 12/23-spoke to patient regarding the results of the CT scan no obvious evidence of of her ongoing anemia.  Cystitis changes noted in the bladder-patient denies any urinary symptoms.  Denies any blood in urine.  Never had any urology evaluation.  Reluctant with referral.   #Discussed given the absence of any obvious expansion of anemia-discussed regarding bone marrow biopsy; patient reluctant.  #Discussed regarding pancreatic lesions/cysts; renal cyst.  In general given patient's overall reluctance with further work-up- this will be discussed at next visit.  C-schedule follow-up in 1 month; MD; labs CBC BMP; iron studies LDH; ferritin.  FYI-Dr.Lateef.

## 2020-11-15 NOTE — Addendum Note (Signed)
Addended by: Gloris Ham on: 11/15/2020 10:46 AM   Modules accepted: Orders

## 2020-11-15 NOTE — Telephone Encounter (Signed)
Lab orders entered

## 2020-11-17 ENCOUNTER — Other Ambulatory Visit: Payer: Self-pay | Admitting: Physician Assistant

## 2020-11-17 DIAGNOSIS — H9209 Otalgia, unspecified ear: Secondary | ICD-10-CM

## 2020-12-01 ENCOUNTER — Other Ambulatory Visit: Payer: Self-pay

## 2020-12-01 ENCOUNTER — Ambulatory Visit
Admission: RE | Admit: 2020-12-01 | Discharge: 2020-12-01 | Disposition: A | Discharge status: Home / Self Care | Payer: Medicare Other | Source: Ambulatory Visit | Attending: Physician Assistant | Admitting: Physician Assistant

## 2020-12-01 DIAGNOSIS — H9209 Otalgia, unspecified ear: Secondary | ICD-10-CM | POA: Diagnosis present

## 2020-12-09 ENCOUNTER — Inpatient Hospital Stay: Payer: Medicare Other

## 2020-12-09 ENCOUNTER — Inpatient Hospital Stay: Payer: Medicare Other | Admitting: Internal Medicine

## 2020-12-13 ENCOUNTER — Inpatient Hospital Stay
Admission: EM | Admit: 2020-12-13 | Discharge: 2020-12-14 | DRG: 291 | Disposition: A | Discharge status: Home Health | Payer: Medicare Other | Attending: Family Medicine | Admitting: Family Medicine

## 2020-12-13 ENCOUNTER — Emergency Department: Payer: Medicare Other

## 2020-12-13 ENCOUNTER — Other Ambulatory Visit: Payer: Self-pay

## 2020-12-13 ENCOUNTER — Inpatient Hospital Stay (HOSPITAL_COMMUNITY)
Admit: 2020-12-13 | Discharge: 2020-12-13 | Disposition: A | Discharge status: Home / Self Care | Payer: Medicare Other | Attending: Internal Medicine | Admitting: Internal Medicine

## 2020-12-13 DIAGNOSIS — Z83438 Family history of other disorder of lipoprotein metabolism and other lipidemia: Secondary | ICD-10-CM | POA: Diagnosis not present

## 2020-12-13 DIAGNOSIS — J9620 Acute and chronic respiratory failure, unspecified whether with hypoxia or hypercapnia: Secondary | ICD-10-CM | POA: Diagnosis present

## 2020-12-13 DIAGNOSIS — J449 Chronic obstructive pulmonary disease, unspecified: Secondary | ICD-10-CM | POA: Diagnosis present

## 2020-12-13 DIAGNOSIS — I472 Ventricular tachycardia: Secondary | ICD-10-CM | POA: Diagnosis present

## 2020-12-13 DIAGNOSIS — I2510 Atherosclerotic heart disease of native coronary artery without angina pectoris: Secondary | ICD-10-CM | POA: Diagnosis present

## 2020-12-13 DIAGNOSIS — I10 Essential (primary) hypertension: Secondary | ICD-10-CM | POA: Diagnosis not present

## 2020-12-13 DIAGNOSIS — J9621 Acute and chronic respiratory failure with hypoxia: Secondary | ICD-10-CM | POA: Diagnosis present

## 2020-12-13 DIAGNOSIS — Z888 Allergy status to other drugs, medicaments and biological substances status: Secondary | ICD-10-CM

## 2020-12-13 DIAGNOSIS — I350 Nonrheumatic aortic (valve) stenosis: Secondary | ICD-10-CM | POA: Diagnosis present

## 2020-12-13 DIAGNOSIS — Z992 Dependence on renal dialysis: Secondary | ICD-10-CM | POA: Diagnosis not present

## 2020-12-13 DIAGNOSIS — E875 Hyperkalemia: Secondary | ICD-10-CM | POA: Diagnosis present

## 2020-12-13 DIAGNOSIS — E785 Hyperlipidemia, unspecified: Secondary | ICD-10-CM | POA: Diagnosis not present

## 2020-12-13 DIAGNOSIS — N2581 Secondary hyperparathyroidism of renal origin: Secondary | ICD-10-CM | POA: Diagnosis present

## 2020-12-13 DIAGNOSIS — I5033 Acute on chronic diastolic (congestive) heart failure: Secondary | ICD-10-CM | POA: Diagnosis present

## 2020-12-13 DIAGNOSIS — M940 Chondrocostal junction syndrome [Tietze]: Secondary | ICD-10-CM | POA: Diagnosis present

## 2020-12-13 DIAGNOSIS — R000 Tachycardia, unspecified: Secondary | ICD-10-CM

## 2020-12-13 DIAGNOSIS — I4510 Unspecified right bundle-branch block: Secondary | ICD-10-CM | POA: Diagnosis present

## 2020-12-13 DIAGNOSIS — K219 Gastro-esophageal reflux disease without esophagitis: Secondary | ICD-10-CM | POA: Diagnosis present

## 2020-12-13 DIAGNOSIS — Z8249 Family history of ischemic heart disease and other diseases of the circulatory system: Secondary | ICD-10-CM | POA: Diagnosis not present

## 2020-12-13 DIAGNOSIS — D631 Anemia in chronic kidney disease: Secondary | ICD-10-CM | POA: Diagnosis present

## 2020-12-13 DIAGNOSIS — I132 Hypertensive heart and chronic kidney disease with heart failure and with stage 5 chronic kidney disease, or end stage renal disease: Secondary | ICD-10-CM | POA: Diagnosis present

## 2020-12-13 DIAGNOSIS — N186 End stage renal disease: Secondary | ICD-10-CM | POA: Diagnosis not present

## 2020-12-13 DIAGNOSIS — J9601 Acute respiratory failure with hypoxia: Secondary | ICD-10-CM

## 2020-12-13 DIAGNOSIS — F419 Anxiety disorder, unspecified: Secondary | ICD-10-CM | POA: Diagnosis present

## 2020-12-13 DIAGNOSIS — Z8674 Personal history of sudden cardiac arrest: Secondary | ICD-10-CM | POA: Diagnosis not present

## 2020-12-13 DIAGNOSIS — Z8616 Personal history of COVID-19: Secondary | ICD-10-CM

## 2020-12-13 DIAGNOSIS — E872 Acidosis: Secondary | ICD-10-CM | POA: Diagnosis present

## 2020-12-13 DIAGNOSIS — I5031 Acute diastolic (congestive) heart failure: Secondary | ICD-10-CM

## 2020-12-13 DIAGNOSIS — E1122 Type 2 diabetes mellitus with diabetic chronic kidney disease: Secondary | ICD-10-CM | POA: Diagnosis present

## 2020-12-13 DIAGNOSIS — E1129 Type 2 diabetes mellitus with other diabetic kidney complication: Secondary | ICD-10-CM | POA: Diagnosis present

## 2020-12-13 DIAGNOSIS — R0603 Acute respiratory distress: Secondary | ICD-10-CM

## 2020-12-13 HISTORY — DX: Atherosclerotic heart disease of native coronary artery without angina pectoris: I25.10

## 2020-12-13 LAB — BASIC METABOLIC PANEL
Anion gap: 21 — ABNORMAL HIGH (ref 5–15)
BUN: 75 mg/dL — ABNORMAL HIGH (ref 8–23)
CO2: 22 mmol/L (ref 22–32)
Calcium: 8.1 mg/dL — ABNORMAL LOW (ref 8.9–10.3)
Chloride: 90 mmol/L — ABNORMAL LOW (ref 98–111)
Creatinine, Ser: 9.13 mg/dL — ABNORMAL HIGH (ref 0.44–1.00)
GFR, Estimated: 4 mL/min — ABNORMAL LOW (ref >60)
Glucose, Bld: 116 mg/dL — ABNORMAL HIGH (ref 70–99)
Potassium: >7.5 mmol/L (ref 3.5–5.1)
Sodium: 133 mmol/L — ABNORMAL LOW (ref 135–145)

## 2020-12-13 LAB — CBC WITH DIFFERENTIAL/PLATELET
Abs Immature Granulocytes: 0.12 10*3/uL — ABNORMAL HIGH (ref 0.00–0.07)
Basophils Absolute: 0.1 10*3/uL (ref 0.0–0.1)
Basophils Relative: 1 %
Eosinophils Absolute: 1.2 10*3/uL — ABNORMAL HIGH (ref 0.0–0.5)
Eosinophils Relative: 7 %
HCT: 38.3 % (ref 36.0–46.0)
Hemoglobin: 11.8 g/dL — ABNORMAL LOW (ref 12.0–15.0)
Immature Granulocytes: 1 %
Lymphocytes Relative: 8 %
Lymphs Abs: 1.4 10*3/uL (ref 0.7–4.0)
MCH: 28 pg (ref 26.0–34.0)
MCHC: 30.8 g/dL (ref 30.0–36.0)
MCV: 90.8 fL (ref 80.0–100.0)
Monocytes Absolute: 0.9 10*3/uL (ref 0.1–1.0)
Monocytes Relative: 5 %
Neutro Abs: 13.4 10*3/uL — ABNORMAL HIGH (ref 1.7–7.7)
Neutrophils Relative %: 78 %
Platelets: 331 10*3/uL (ref 150–400)
RBC: 4.22 MIL/uL (ref 3.87–5.11)
RDW: 14.3 % (ref 11.5–15.5)
WBC: 17 10*3/uL — ABNORMAL HIGH (ref 4.0–10.5)
nRBC: 0 % (ref 0.0–0.2)

## 2020-12-13 LAB — HEPATITIS B SURFACE ANTIGEN: Hepatitis B Surface Ag: NONREACTIVE

## 2020-12-13 LAB — C DIFFICILE QUICK SCREEN W PCR REFLEX
C Diff antigen: NEGATIVE
C Diff interpretation: NOT DETECTED
C Diff toxin: NEGATIVE

## 2020-12-13 LAB — BLOOD GAS, VENOUS
Acid-base deficit: 5.2 mmol/L — ABNORMAL HIGH (ref 0.0–2.0)
Bicarbonate: 21.6 mmol/L (ref 20.0–28.0)
O2 Saturation: 98.7 %
Patient temperature: 37
pCO2, Ven: 46 mmHg (ref 44.0–60.0)
pH, Ven: 7.28 (ref 7.250–7.430)
pO2, Ven: 135 mmHg — ABNORMAL HIGH (ref 32.0–45.0)

## 2020-12-13 LAB — BRAIN NATRIURETIC PEPTIDE: B Natriuretic Peptide: 2514 pg/mL — ABNORMAL HIGH (ref 0.0–100.0)

## 2020-12-13 LAB — ECHOCARDIOGRAM COMPLETE
AR max vel: 0.79 cm2
AV Area VTI: 0.74 cm2
AV Area mean vel: 0.71 cm2
AV Mean grad: 18 mmHg
AV Peak grad: 34.3 mmHg
Ao pk vel: 2.93 m/s
Area-P 1/2: 3.18 cm2
MV VTI: 1.51 cm2
S' Lateral: 2.86 cm
Weight: 2880 oz

## 2020-12-13 LAB — POC SARS CORONAVIRUS 2 AG -  ED: SARS Coronavirus 2 Ag: NEGATIVE

## 2020-12-13 LAB — TROPONIN I (HIGH SENSITIVITY)
Troponin I (High Sensitivity): 58 ng/L — ABNORMAL HIGH (ref <18)
Troponin I (High Sensitivity): 78 ng/L — ABNORMAL HIGH (ref <18)

## 2020-12-13 MED ORDER — ASPIRIN EC 81 MG PO TBEC: 81.0000 mg | DELAYED_RELEASE_TABLET | Freq: Every day | ORAL | Status: DC

## 2020-12-13 MED ORDER — IPRATROPIUM-ALBUTEROL 0.5-2.5 (3) MG/3ML IN SOLN
3.0000 mL | Freq: Once | RESPIRATORY_TRACT | Status: AC
  Administered 2020-12-13: 3 mL via RESPIRATORY_TRACT
  Filled 2020-12-13: qty 3

## 2020-12-13 MED ORDER — NITROGLYCERIN 0.4 MG SL SUBL: 0.4000 mg | SUBLINGUAL_TABLET | SUBLINGUAL | Status: DC | PRN

## 2020-12-13 MED ORDER — CYCLOBENZAPRINE HCL 10 MG PO TABS: 10.0000 mg | ORAL_TABLET | Freq: Every day | ORAL | Status: DC | PRN

## 2020-12-13 MED ORDER — SODIUM ZIRCONIUM CYCLOSILICATE 10 G PO PACK
10.0000 g | PACK | Freq: Every day | ORAL | Status: DC
  Administered 2020-12-13: 10 g via ORAL
  Filled 2020-12-13 (×2): qty 1

## 2020-12-13 MED ORDER — CARVEDILOL 6.25 MG PO TABS
3.1250 mg | ORAL_TABLET | Freq: Two times a day (BID) | ORAL | Status: DC
  Administered 2020-12-13 – 2020-12-14 (×2): 3.125 mg via ORAL
  Filled 2020-12-13 (×2): qty 1

## 2020-12-13 MED ORDER — HYDROCODONE-ACETAMINOPHEN 7.5-325 MG PO TABS: 1.0000 | ORAL_TABLET | Freq: Four times a day (QID) | ORAL | Status: DC | PRN

## 2020-12-13 MED ORDER — SODIUM CHLORIDE 0.9% FLUSH
3.0000 mL | Freq: Two times a day (BID) | INTRAVENOUS | Status: DC
  Administered 2020-12-13: 3 mL via INTRAVENOUS

## 2020-12-13 MED ORDER — DEXTROSE 50 % IV SOLN
1.0000 | Freq: Once | INTRAVENOUS | Status: AC
  Administered 2020-12-13: 50 mL via INTRAVENOUS
  Filled 2020-12-13: qty 50

## 2020-12-13 MED ORDER — PRAVASTATIN SODIUM 40 MG PO TABS
40.0000 mg | ORAL_TABLET | Freq: Every day | ORAL | Status: DC
  Administered 2020-12-13: 40 mg via ORAL
  Filled 2020-12-13: qty 1

## 2020-12-13 MED ORDER — METHYLPREDNISOLONE SODIUM SUCC 125 MG IJ SOLR
125.0000 mg | Freq: Once | INTRAMUSCULAR | Status: AC
  Administered 2020-12-13: 125 mg via INTRAVENOUS
  Filled 2020-12-13: qty 2

## 2020-12-13 MED ORDER — ONDANSETRON HCL 4 MG/2ML IJ SOLN
4.0000 mg | Freq: Once | INTRAMUSCULAR | Status: AC
  Administered 2020-12-13: 4 mg via INTRAVENOUS
  Filled 2020-12-13: qty 2

## 2020-12-13 MED ORDER — MIRTAZAPINE 15 MG PO TABS
15.0000 mg | ORAL_TABLET | Freq: Every day | ORAL | Status: DC
  Administered 2020-12-13: 15 mg via ORAL
  Filled 2020-12-13: qty 1

## 2020-12-13 MED ORDER — ONDANSETRON HCL 4 MG/2ML IJ SOLN: 4.0000 mg | Freq: Four times a day (QID) | INTRAMUSCULAR | Status: DC | PRN

## 2020-12-13 MED ORDER — BUSPIRONE HCL 5 MG PO TABS
5.0000 mg | ORAL_TABLET | Freq: Two times a day (BID) | ORAL | Status: DC
  Administered 2020-12-13: 5 mg via ORAL
  Filled 2020-12-13: qty 1

## 2020-12-13 MED ORDER — ASPIRIN 81 MG PO CHEW: 324.0000 mg | CHEWABLE_TABLET | Freq: Once | ORAL | Status: DC

## 2020-12-13 MED ORDER — SERTRALINE HCL 50 MG PO TABS
50.0000 mg | ORAL_TABLET | Freq: Every day | ORAL | Status: DC
Start: 2020-12-13 — End: 2020-12-14
  Administered 2020-12-13: 50 mg via ORAL
  Filled 2020-12-13: qty 1

## 2020-12-13 MED ORDER — SODIUM CHLORIDE 0.9% FLUSH: 3.0000 mL | INTRAVENOUS | Status: DC | PRN

## 2020-12-13 MED ORDER — ACETAMINOPHEN 325 MG PO TABS: 650.0000 mg | ORAL_TABLET | ORAL | Status: DC | PRN

## 2020-12-13 MED ORDER — SODIUM CHLORIDE 0.9 % IV SOLN: 250.0000 mL | INTRAVENOUS | Status: DC | PRN

## 2020-12-13 MED ORDER — LORATADINE 10 MG PO TABS: 10.0000 mg | ORAL_TABLET | Freq: Every day | ORAL | Status: DC

## 2020-12-13 MED ORDER — TORSEMIDE 20 MG PO TABS
20.0000 mg | ORAL_TABLET | Freq: Every day | ORAL | Status: DC
  Administered 2020-12-13: 20 mg via ORAL
  Filled 2020-12-13 (×2): qty 1

## 2020-12-13 MED ORDER — OXYBUTYNIN CHLORIDE ER 5 MG PO TB24
15.0000 mg | ORAL_TABLET | Freq: Every day | ORAL | Status: DC
  Filled 2020-12-13: qty 1

## 2020-12-13 MED ORDER — LIDOCAINE-PRILOCAINE 2.5-2.5 % EX CREA: 1.0000 "application " | TOPICAL_CREAM | CUTANEOUS | Status: DC | PRN

## 2020-12-13 MED ORDER — EPOETIN ALFA 10000 UNIT/ML IJ SOLN: 4000.0000 [IU] | INTRAMUSCULAR | Status: DC

## 2020-12-13 MED ORDER — CALCIUM GLUCONATE 10 % IV SOLN
1.0000 g | Freq: Once | INTRAVENOUS | Status: AC
  Administered 2020-12-13: 1 g via INTRAVENOUS
  Filled 2020-12-13: qty 10

## 2020-12-13 MED ORDER — SENNOSIDES-DOCUSATE SODIUM 8.6-50 MG PO TABS: 1.0000 | ORAL_TABLET | Freq: Every day | ORAL | Status: DC

## 2020-12-13 MED ORDER — LIDOCAINE 5 % EX PTCH
1.0000 | MEDICATED_PATCH | CUTANEOUS | Status: DC
  Administered 2020-12-14: 1 via TRANSDERMAL
  Filled 2020-12-13: qty 1

## 2020-12-13 MED ORDER — SODIUM BICARBONATE 8.4 % IV SOLN
50.0000 meq | Freq: Once | INTRAVENOUS | Status: AC
  Administered 2020-12-13: 50 meq via INTRAVENOUS
  Filled 2020-12-13: qty 50

## 2020-12-13 MED ORDER — INSULIN ASPART 100 UNIT/ML ~~LOC~~ SOLN
10.0000 [IU] | Freq: Once | SUBCUTANEOUS | Status: AC
  Administered 2020-12-13: 10 [IU] via INTRAVENOUS
  Filled 2020-12-13: qty 1

## 2020-12-13 MED ORDER — ACETAMINOPHEN 500 MG PO TABS: 500.0000 mg | ORAL_TABLET | Freq: Four times a day (QID) | ORAL | Status: DC | PRN

## 2020-12-13 MED ORDER — MELATONIN 5 MG PO TABS
5.0000 mg | ORAL_TABLET | Freq: Every day | ORAL | Status: DC
  Administered 2020-12-13: 5 mg via ORAL
  Filled 2020-12-13: qty 1

## 2020-12-13 MED ORDER — ISOSORBIDE MONONITRATE ER 60 MG PO TB24
60.0000 mg | ORAL_TABLET | Freq: Every day | ORAL | Status: DC
Start: 2020-12-14 — End: 2020-12-14

## 2020-12-13 MED ORDER — HEPARIN SODIUM (PORCINE) 5000 UNIT/ML IJ SOLN
5000.0000 [IU] | Freq: Three times a day (TID) | INTRAMUSCULAR | Status: DC
  Administered 2020-12-13 – 2020-12-14 (×2): 5000 [IU] via SUBCUTANEOUS
  Filled 2020-12-13 (×2): qty 1

## 2020-12-13 MED ORDER — LOSARTAN POTASSIUM 50 MG PO TABS: 50.0000 mg | ORAL_TABLET | Freq: Every day | ORAL | Status: DC
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
## 2020-12-13 NOTE — ED Triage Notes (Signed)
Pt to ED [facility] from home for chief complaint of shob at dialysis. Missed dialysis Saturday and did not receive dialysis today. Gained 3.6 pounds today per ems.  71% on NRB improved to 95% after neb from ems Pt in distress on arrival, labored breathing. Oriented, alert  Pt not answering all triage questions d/t distress

## 2020-12-13 NOTE — ED Notes (Signed)
Pt had a large BM at this time. Pt clean and changed. Pt repositioned in the bed. Pt given sandwich tray at this time.

## 2020-12-13 NOTE — ED Notes (Signed)
Dialysis nurse at bedside at this time

## 2020-12-13 NOTE — ED Notes (Signed)
Dr Francine Graven notified of pt having episode of bradycardia with nausea and dry heaving. Medications for hyperkalemia given. Denies CP

## 2020-12-13 NOTE — ED Notes (Addendum)
Dr Quentin Cornwall notified of potassium greater than 7.5. orders placed

## 2020-12-13 NOTE — Progress Notes (Signed)
[facility]  ROUNDING NOTE   Subjective:   [name] was admitted to [facility] on 12/13/2020 for Respiratory failure, acute-on-chronic ([place]) [J96.20]  Last hemodialysis treatment was 1/20. Missed Saturday treatment.     Objective:  Vital signs in last 24 hours:  Temp:  [98 F (36.7 C)] 98 F (36.7 C) (01/24 1145) Pulse Rate:  [56-93] 56 (01/24 1415) Resp:  [18-35] 19 (01/24 1415) BP: (103-123)/(41-56) 122/41 (01/24 1415) SpO2:  [92 %-100 %] 98 % (01/24 1415) Weight:  [81.6 kg] 81.6 kg (01/24 0833)  Weight change:  Filed Weights   12/13/20 0833  Weight: 81.6 kg    Intake/Output: No intake/output data recorded.   Intake/Output this shift:  No intake/output data recorded.  Physical Exam: General: Critically ill  Head: BIPAP  Eyes: Anicteric, PERRL  Neck: Supple, trachea midline  Lungs:  Bilateral crackles  Heart: Regular rate and rhythm  Abdomen:  Soft, nontender,   Extremities: + peripheral edema.  Neurologic: Nonfocal, moving all four extremities  Skin: No lesions  Access: Left AVF    Basic Metabolic Panel: Recent Labs  Lab 12/13/20 0829  NA 133*  K >7.5*  CL 90*  CO2 22  GLUCOSE 116*  BUN 75*  CREATININE 9.13*  CALCIUM 8.1*    Liver Function Tests: No results for input(s): AST, ALT, ALKPHOS, BILITOT, PROT, ALBUMIN in the last 168 hours. No results for input(s): LIPASE, AMYLASE in the last 168 hours. No results for input(s): AMMONIA in the last 168 hours.  CBC: Recent Labs  Lab 12/13/20 0829  WBC 17.0*  NEUTROABS 13.4*  HGB 11.8*  HCT 38.3  MCV 90.8  PLT 331    Cardiac Enzymes: No results for input(s): CKTOTAL, CKMB, CKMBINDEX, TROPONINI in the last 168 hours.  BNP: Invalid input(s): POCBNP  CBG: No results for input(s): GLUCAP in the last 168 hours.  Microbiology: Results for orders placed or performed during the hospital encounter of 08/28/20  Blood culture (routine single)     Status: None   Collection  Time: 08/28/20  9:59 AM   Specimen: BLOOD  Result Value Ref Range Status   Specimen Description BLOOD RAC  Final   Special Requests   Final    BOTTLES DRAWN AEROBIC AND ANAEROBIC Blood Culture adequate volume   Culture   Final    NO GROWTH 5 DAYS Performed at [facility], [address], [place], [place] [address]    Report Status 09/02/2020 FINAL  Final  Respiratory Panel by RT PCR (Flu A&B, Covid) - Nasopharyngeal Swab     Status: None   Collection Time: 08/28/20  9:59 AM   Specimen: Nasopharyngeal Swab  Result Value Ref Range Status   SARS Coronavirus 2 by RT PCR NEGATIVE NEGATIVE Final    Comment: (NOTE) SARS-CoV-2 target nucleic acids are NOT DETECTED.  The SARS-CoV-2 RNA is generally detectable in upper respiratoy specimens during the acute phase of infection. The lowest concentration of SARS-CoV-2 viral copies this assay can detect is 131 copies/mL. A negative result does not preclude SARS-Cov-2 infection and should not be used as the sole basis for treatment or other patient management decisions. A negative result may occur with  improper specimen collection/handling, submission of specimen other than nasopharyngeal swab, presence of viral mutation(s) within the areas targeted by this assay, and inadequate number of viral copies (<131 copies/mL). A negative result must be combined with clinical observations, patient history, and epidemiological information. The expected result is Negative.  Fact Sheet  for Patients:  PinkCheek.be  Fact Sheet for Healthcare Providers:  GravelBags.it  This test is no t yet approved or cleared by the [place] FDA and  has been authorized for detection and/or diagnosis of SARS-CoV-2 by FDA under an Emergency Use Authorization (EUA). This EUA will remain  in effect (meaning this test can be used) for the duration of the COVID-19 declaration under Section 564(b)(1) of  the Act, 21 U.S.C. section 360bbb-3(b)(1), unless the authorization is terminated or revoked sooner.     Influenza A by PCR NEGATIVE NEGATIVE Final   Influenza B by PCR NEGATIVE NEGATIVE Final    Comment: (NOTE) The Xpert Xpress SARS-CoV-2/FLU/RSV assay is intended as an aid in  the diagnosis of influenza from Nasopharyngeal swab specimens and  should not be used as a sole basis for treatment. Nasal washings and  aspirates are unacceptable for Xpert Xpress SARS-CoV-2/FLU/RSV  testing.  Fact Sheet for Patients: PinkCheek.be  Fact Sheet for Healthcare Providers: GravelBags.it  This test is not yet approved or cleared by the [place] FDA and  has been authorized for detection and/or diagnosis of SARS-CoV-2 by  FDA under an Emergency Use Authorization (EUA). This EUA will remain  in effect (meaning this test can be used) for the duration of the  Covid-19 declaration under Section 564(b)(1) of the Act, 21  U.S.C. section 360bbb-3(b)(1), unless the authorization is  terminated or revoked. Performed at [facility], [address] [place], [place] [address]     Coagulation Studies: No results for input(s): LABPROT, INR in the last 72 hours.  Urinalysis: No results for input(s): COLORURINE, LABSPEC, PHURINE, GLUCOSEU, HGBUR, BILIRUBINUR, KETONESUR, PROTEINUR, UROBILINOGEN, NITRITE, LEUKOCYTESUR in the last 72 hours.  Invalid input(s): APPERANCEUR    Imaging: DG Chest Portable 1 View  Result Date: 12/13/2020 CLINICAL DATA:  Respiratory distress, shortness of breath at dialysis missed dialysis Saturday and today EXAM: PORTABLE CHEST 1 VIEW COMPARISON:  Chest radiograph August 30, 2020 FINDINGS: The heart size and mediastinal contours are enlarged. Diffuse interstitial opacity with bibasilar predominant alveolar opacities no significant pleural effusion or pneumothorax. The visualized skeletal structures are  unremarkable. IMPRESSION: Diffuse interstitial opacity with bibasilar predominant alveolar opacities, which likely reflects pulmonary edema. Electronically Signed   By: Dahlia Bailiff MD   On: 12/13/2020 09:05     Medications:   . sodium chloride     . aspirin  324 mg Oral Once  . aspirin  81 mg Oral Daily  . busPIRone  5 mg Oral BID  . carvedilol  3.125 mg Oral BID WC  . [START ON 12/14/2020] epoetin alfa  4,000 Units Intravenous Q T,Th,Sa-HD  . heparin  5,000 Units Subcutaneous Q8H  . isosorbide mononitrate  60 mg Oral Daily  . lidocaine  1 patch Transdermal Q24H  . loratadine  10 mg Oral Daily  . Melatonin  1 tablet Oral QHS  . mirtazapine  15 mg Oral QHS  . oxybutynin  15 mg Oral Daily  . pravastatin  40 mg Oral QHS  . senna-docusate  1 tablet Oral Daily  . sertraline  50 mg Oral QHS  . sodium chloride flush  3 mL Intravenous Q12H  . sodium zirconium cyclosilicate  10 g Oral Daily  . torsemide  20 mg Oral Daily   sodium chloride, acetaminophen, cyclobenzaprine, HYDROcodone-acetaminophen, lidocaine-prilocaine, nitroGLYCERIN, ondansetron (ZOFRAN) IV, sodium chloride flush  Assessment/ Plan:  [name] is a 75 y.o. white female with end stage renal disease on hemodialysis, hypertension,  aortic stenosis, COPD, diabetes mellitus type II, chronic costochrondritis who was admitted to [facility] on 12/13/2020 for  Respiratory failure, acute-on-chronic ([place]) [J96.20]   [place]. Left AVF 61kg  1. End Stage Renal Disease with hyperkalemia: emergent hemodialysis treatment    2. Hypertension: BP readings within acceptable range - On Carvedilol,Furosemide,Imdur and PRN Hydralazine   3. Anemia with chronic kidney disease: hemoglboin 11.8 - EPO as outpateint  4. Secondary Hyperparathyroidism:  -Continue calcium acetate with meals   LOS: 0 Clariza Sickman 1/24/20222:42 PM

## 2020-12-13 NOTE — Progress Notes (Signed)
*  PRELIMINARY RESULTS* Echocardiogram 2D Echocardiogram has been performed.  Phyllis [name] 12/13/2020, 2:37 PM

## 2020-12-13 NOTE — ED Notes (Signed)
Pt assisted to the bedside commode 2-person assistance. Pt noted to have diarrhea. Pt requesting antidiarrheal medication. MD notified.

## 2020-12-13 NOTE — ED Notes (Addendum)
Pt noted to have cardiac monitor changes with rate in the 130's , Dr Quentin Cornwall notified. Pt back in NSR. Pt alert to verbal stimuli. RR continue to be labored.

## 2020-12-13 NOTE — ED Provider Notes (Signed)
[facility] Emergency Department Provider Note    None    (approximate)  I have reviewed the triage vital signs and the nursing notes.   HISTORY  Chief Complaint Shortness of Breath  Level V Caveat:  Resp distress  HPI Phyllis [name] is a 75 y.o. female   with the below listed past medical history presents to the ER in acute respiratory distress.  Reportedly is due for dialysis today but was found to be hypoxic in the 70s.  Feels like she is retaining fluid and reportedly a little more than 3 pounds of weight gain.  Denies any chest pain.  She arrives in respiratory distress on NRB.  She was given nebulizer treatment with some improvement.   Past Medical History:  Diagnosis Date  . (HFpEF) heart failure with preserved ejection fraction ([place])    a. TTE 01/2014: nl LV sys fxn, no valvular abnormalities; b. TTE 11/16: nl EF, mild LVH;  c. 04/2019 Echo: EF 60-65%. DD; d. 08/2020 Echo: EF 55-60%, no rwma, mild LVH, gr2 DD, nl RV size/fxn, mod dil LA, mildly dil RA, mild MR. Mod to sev AS (AoV area 0.8cm^2)  . Allergy   . Anemia of chronic disease   . Anxiety   . Aortic atherosclerosis ([place])   . Asthma   . CAD (coronary artery disease)    a. 09/2019 Cath: LM 80ost/m, 99d, LAD 99ost, 70p/m, LCX 65ost/p, OM2 70, RCA nl-->poor PCI/surgical candidate-->med Rx.  . Chronic back pain   . COPD (chronic obstructive pulmonary disease) ([place])   . COVID-19 virus infection 09/2019  . Diabetes mellitus with complication ([place])   . ESRD on hemodialysis ([place])    a. Tues/Sat; b. 2/2 small kidneys  . Essential hypertension   . Fistula    lower left arm  . GERD (gastroesophageal reflux disease)   . Gout   . History of exercise stress test    a. 01/2014: no evidence of ischemia; b. Lexiscan 08/2015: no sig ischemia, severe GI uptake artifact, low risk; c. CPX @ [facility] 09/2016: exercised 3 min 12 sec on bike without incline, 2.28 METs, VO2 of 8.1, 48% of predicted, indicating  mod to sev functional impairment, evidence of blunted HR, stroke volume, and BP augmentation as well as ventilation-perfusion mismatch with exercise  . HLD (hyperlipidemia)   . Moderate to Severe aortic stenosis    a. 04/2019 Echo: Mod AS; c. 08/2020 Echo: Mod to sev AS (AoV area 0.8cm^2).  . Non-obstructive Carotid arterial disease ([place])    a. 12/2017: <50% bilat ICA dzs.  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 Sleep apnea    Family History  Problem Relation Age of Onset  . Hypertension Mother   . Hyperlipidemia Mother   . Heart disease Father   . Heart attack Father 35  . Hypertension Father   . Hyperlipidemia Father   . Heart disease Brother        CABG   . Heart attack Brother   . Breast cancer Neg Hx    Past Surgical History:  Procedure Laterality Date  . CARDIAC CATHETERIZATION    . carpel tunnel    . GALLBLADDER SURGERY    . PERIPHERAL VASCULAR CATHETERIZATION N/A 04/12/2015   Procedure: A/V Shuntogram/Fistulagram;  Surgeon: Algernon Huxley, MD;  Location: [place] [facility];  Service: Cardiovascular;  Laterality: N/A;  . PERIPHERAL VASCULAR CATHETERIZATION N/A 04/12/2015   Procedure: A/V Shunt Intervention;  Surgeon: Algernon Huxley, MD;  Location: [place] [facility];  Service: Cardiovascular;  Laterality: N/A;  . PERIPHERAL VASCULAR CATHETERIZATION N/A 06/09/2015   Procedure: Dialysis/Perma Catheter Removal;  Surgeon: Katha Cabal, MD;  Location: [place] [facility];  Service: Cardiovascular;  Laterality: N/A;  . RIGHT/LEFT HEART CATH AND CORONARY ANGIOGRAPHY N/A 10/14/2019   Procedure: RIGHT/LEFT HEART CATH AND CORONARY ANGIOGRAPHY;  Surgeon: Belva Crome, MD;  Location: [place] [facility];  Service: Cardiovascular;  Laterality: N/A;   Patient Active Problem List   Diagnosis Date Noted  . Respiratory failure, acute-on-chronic ([place]) 12/13/2020  . Acute respiratory failure with hypoxia ([place]) 08/29/2020  . Acute on chronic respiratory failure  with hypoxia ([place]) 08/28/2020  . Depression with anxiety 08/28/2020  . Type II diabetes mellitus with renal manifestations ([place]) 08/28/2020  . Acute on chronic diastolic (congestive) heart failure ([place]) 04/30/2020  . COPD with chronic bronchitis ([place]) 04/30/2020  . Dyspnea 04/20/2020  . Asymptomatic bacteriuria 12/05/2019  . COPD with acute exacerbation ([place]) 10/23/2019  . Elevated troponin 10/23/2019  . CAD (coronary artery disease) 10/23/2019  . COPD exacerbation ([place]) 10/23/2019  . Leukocytosis 10/23/2019  . Anxiety 10/23/2019  . Palliative care by specialist   . Goals of care, counseling/discussion   . Flash pulmonary edema ([place]) 10/14/2019  . Respiratory failure with hypoxia ([place]) 10/11/2019  . 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-stage renal disease) ([place])   . Aortic valve stenosis   . Acute respiratory failure with hypoxemia ([place]) 09/20/2019  . COPD with acute bronchitis ([place]) 09/05/2019  . Congestive heart failure ([place]) 05/24/2019  . Hyperkalemia 11/29/2018  . Chronic diastolic heart failure ([place]) 11/02/2018  . HTN (hypertension) 11/02/2018  . Uncontrolled hypertension 10/21/2018  . History of acute pulmonary edema 10/21/2018  . Pressure injury of skin 03/16/2018  . CVA (cerebral vascular accident) ([place]) 01/16/2018  . Aortic atherosclerosis ([place]) 12/11/2017  . Chest pain 09/18/2017  . Hyperlipidemia 08/06/2015  . Complication from renal dialysis device 04/12/2015  . SOB (shortness of breath) 02/01/2015  . ESRD (end stage renal disease) on dialysis ([place]) 02/01/2015  . Type 2 diabetes mellitus with other specified complication ([place]) 63/87/5643  . Asthma 02/01/2015      Prior to Admission medications   Medication Sig Start Date End Date Taking? Authorizing Provider  acetaminophen (TYLENOL) 500 MG tablet Take 500-1,000 mg by mouth every 6 (six) hours as needed for mild pain, moderate pain or fever.   Yes [provider]  albuterol (PROVENTIL HFA;VENTOLIN HFA) 108 (90 Base)  MCG/ACT inhaler Inhale 2 puffs into the lungs every 6 (six) hours as needed for wheezing or shortness of breath. Out of medication   Yes [provider]  aspirin 81 MG EC tablet Take 81 mg by mouth daily. 01/27/20  Yes [provider]  busPIRone (BUSPAR) 5 MG tablet Take 5 mg by mouth 2 (two) times daily. 09/08/20  Yes [provider]  calcium acetate (PHOSLO) 667 MG capsule Take 1,334 mg by mouth 3 (three) times daily with meals. 10/31/20  Yes [provider]  carvedilol (COREG) 3.125 MG tablet Take 1 tablet (3.125 mg total) by mouth 2 (two) times daily with a meal. 04/30/20  Yes Dhungel, Nishant, MD  cetirizine (ZYRTEC) 10 MG tablet Take 10 mg by mouth daily. 09/29/20  Yes [provider]  cyclobenzaprine (FLEXERIL) 10 MG tablet Take 10 mg by mouth daily as needed for muscle spasms.   Yes [provider]  epoetin alfa (EPOGEN) 10000 UNIT/ML injection  Inject 0.4 mLs (4,000 Units total) into the vein Every Tuesday,Thursday,and Saturday with dialysis. 04/03/20  Yes Nolberto Hanlon, MD  HYDROcodone-acetaminophen (NORCO) 7.5-325 MG tablet Take 1-2 tablets by mouth daily as needed for pain. 12/09/20  Yes [provider]  ipratropium-albuterol (DUONEB) 0.5-2.5 (3) MG/3ML SOLN Take 3 mLs by nebulization every 6 (six) hours as needed. Patient taking differently: Take 3 mLs by nebulization every 6 (six) hours as needed (shortness of breath or wheezing). 10/17/19  Yes [facility], Tami Lin, PA  isosorbide mononitrate (IMDUR) 60 MG 24 hr tablet Take 1 tablet (60 mg total) by mouth daily. 09/04/20  Yes Wouk, Ailene Rud, MD  lidocaine (LIDODERM) 5 % Place 1 patch onto the skin daily. Remove & Discard patch within 12 hours or as directed by MD 10/14/19  Yes Flora Lipps, MD  lidocaine-prilocaine (EMLA) cream Apply 1 application topically as needed (prior to treatment).    Yes [provider]  losartan (COZAAR) 50 MG tablet Take 50 mg by mouth daily.  10/10/20  Yes [provider]  Melatonin 5 MG CHEW Chew 1 tablet by mouth at bedtime.   Yes [provider]  mirtazapine (REMERON) 15 MG tablet Take 15 mg by mouth at bedtime. 02/25/20  Yes [provider]  nitroGLYCERIN (NITROSTAT) 0.4 MG SL tablet Place 1 tablet (0.4 mg total) under the tongue every 5 (five) minutes as needed for chest pain. 09/02/20  Yes Gollan, Kathlene November, MD  oxybutynin (DITROPAN XL) 15 MG 24 hr tablet Take 15 mg by mouth daily. 12/24/17  Yes [provider]  pravastatin (PRAVACHOL) 40 MG tablet Take 40 mg by mouth at bedtime.    Yes [provider]  senna-docusate (SENOKOT-S) 8.6-50 MG tablet Take 1 tablet by mouth daily.   Yes [provider]  sertraline (ZOLOFT) 50 MG tablet Take 50 mg by mouth at bedtime. 03/02/20  Yes [provider]  torsemide (DEMADEX) 20 MG tablet Take 40 mg by mouth daily. 09/16/20  Yes [provider]    Allergies Enalapril maleate; Nitrofurantoin; Sulfamethoxazole-trimethoprim; 2,4-d dimethylamine (amisol); Baclofen; Bactrim [sulfamethoxazole-trimethoprim]; Macrodantin [nitrofurantoin macrocrystal]; Neosporin [neomycin-bacitracin zn-polymyx]; Quinine; Quinine derivatives; Ultram [tramadol]; Zocor [simvastatin]; and Levodopa    Social History Social History   Tobacco Use  . Smoking status: Never Smoker  . Smokeless tobacco: Never Used  Vaping Use  . Vaping Use: Never used  Substance Use Topics  . Alcohol use: No  . Drug use: No    Review of Systems Patient denies headaches, rhinorrhea, blurry vision, numbness, shortness of breath, chest pain, edema, cough, abdominal pain, nausea, vomiting, diarrhea, dysuria, fevers, rashes or hallucinations unless otherwise stated above in HPI. ____________________________________________   PHYSICAL EXAM:  VITAL SIGNS: Vitals:   12/13/20 1515 12/13/20 1530  BP: (!) 130/54 (!) 129/52  Pulse: 81 85  Resp: 18 17  Temp:    SpO2:  100% 100%    Constitutional: Alert and oriented.  Eyes: Conjunctivae are normal.  Head: Atraumatic. Nose: No congestion/rhinnorhea. Mouth/Throat: Mucous membranes are moist.   Neck: No stridor. Painless ROM.  Cardiovascular: Normal rate, regular rhythm. Grossly normal heart sounds.  Good peripheral circulation. Respiratory: Normal respiratory effort.  No retractions. Lungs CTAB. Gastrointestinal: Soft and nontender. No distention. No abdominal bruits. No CVA tenderness. Genitourinary:  Musculoskeletal: No lower extremity tenderness nor edema.  No joint effusions. Neurologic:  Normal speech and language. No gross focal neurologic deficits are appreciated. No facial droop Skin:  Skin is warm, dry and intact. No rash noted. Psychiatric:  Mood and affect are normal. Speech and behavior are normal.  ____________________________________________   LABS (all labs ordered are listed, but only abnormal results are displayed)  Results for orders placed or performed during the hospital encounter of 12/13/20 (from the past 24 hour(s))  CBC with Differential     Status: Abnormal   Collection Time: 12/13/20  8:29 AM  Result Value Ref Range   WBC 17.0 (H) 4.0 - 10.5 K/uL   RBC 4.22 3.87 - 5.11 MIL/uL   Hemoglobin 11.8 (L) 12.0 - 15.0 g/dL   HCT 38.3 36.0 - 46.0 %   MCV 90.8 80.0 - 100.0 fL   MCH 28.0 26.0 - 34.0 pg   MCHC 30.8 30.0 - 36.0 g/dL   RDW 14.3 11.5 - 15.5 %   Platelets 331 150 - 400 K/uL   nRBC 0.0 0.0 - 0.2 %   Neutrophils Relative % 78 %   Neutro Abs 13.4 (H) 1.7 - 7.7 K/uL   Lymphocytes Relative 8 %   Lymphs Abs 1.4 0.7 - 4.0 K/uL   Monocytes Relative 5 %   Monocytes Absolute 0.9 0.1 - 1.0 K/uL   Eosinophils Relative 7 %   Eosinophils Absolute 1.2 (H) 0.0 - 0.5 K/uL   Basophils Relative 1 %   Basophils Absolute 0.1 0.0 - 0.1 K/uL   Immature Granulocytes 1 %   Abs Immature Granulocytes 0.12 (H) 0.00 - 0.07 K/uL  Brain natriuretic peptide     Status: Abnormal   Collection  Time: 12/13/20  8:29 AM  Result Value Ref Range   B Natriuretic Peptide 2,514.0 (H) 0.0 - 100.0 pg/mL  Troponin I (High Sensitivity)     Status: Abnormal   Collection Time: 12/13/20  8:29 AM  Result Value Ref Range   Troponin I (High Sensitivity) 58 (H) <18 ng/L  Basic metabolic panel     Status: Abnormal   Collection Time: 12/13/20  8:29 AM  Result Value Ref Range   Sodium 133 (L) 135 - 145 mmol/L   Potassium >7.5 (HH) 3.5 - 5.1 mmol/L   Chloride 90 (L) 98 - 111 mmol/L   CO2 22 22 - 32 mmol/L   Glucose, Bld 116 (H) 70 - 99 mg/dL   BUN 75 (H) 8 - 23 mg/dL   Creatinine, Ser 9.13 (H) 0.44 - 1.00 mg/dL   Calcium 8.1 (L) 8.9 - 10.3 mg/dL   GFR, Estimated 4 (L) >60 mL/min   Anion gap 21 (H) 5 - 15  Blood gas, venous     Status: Abnormal   Collection Time: 12/13/20  8:33 AM  Result Value Ref Range   pH, Ven 7.28 7.250 - 7.430   pCO2, Ven 46 44.0 - 60.0 mmHg   pO2, Ven 135.0 (H) 32.0 - 45.0 mmHg   Bicarbonate 21.6 20.0 - 28.0 mmol/L   Acid-base deficit 5.2 (H) 0.0 - 2.0 mmol/L   O2 Saturation 98.7 %   Patient temperature 37.0    Collection site VEIN    Sample type VEIN   POC SARS Coronavirus 2 Ag-ED - Nasal Swab (BD Veritor Kit)     Status: None   Collection Time: 12/13/20  8:33 AM  Result Value Ref Range   SARS Coronavirus 2 Ag Negative Negative  Troponin I (High Sensitivity)     Status: Abnormal   Collection Time: 12/13/20 12:04 PM  Result Value Ref Range   Troponin I (High Sensitivity) 78 (H) <18 ng/L   ____________________________________________  EKG My review and personal interpretation  at Time: 8:31   Indication: resp distress  Rate: 80  Rhythm: sinus Axis: normal Other: normal intervals, no stemi  My review and personal interpretation at Time: 9:22  Indication: resp distress  Rate: 80  Rhythm: junctional Axis: left Other: diffuse nonspecific st and twave abnormality,  Likely rate dependent ____________________________________________  RADIOLOGY  I personally  reviewed all radiographic images ordered to evaluate for the above acute complaints and reviewed radiology reports and findings.  These findings were personally discussed with the patient.  Please see medical record for radiology report.  ____________________________________________   PROCEDURES  Procedure(s) performed:  .Critical Care Performed by: Merlyn Lot, MD Authorized by: Merlyn Lot, MD   Critical care provider statement:    Critical care time (minutes):  40   Critical care time was exclusive of:  Separately billable procedures and treating other patients   Critical care was necessary to treat or prevent imminent or life-threatening deterioration of the following conditions:  Respiratory failure   Critical care was time spent personally by me on the following activities:  Development of treatment plan with patient or surrogate, discussions with consultants, evaluation of patient's response to treatment, examination of patient, obtaining history from patient or surrogate, ordering and performing treatments and interventions, ordering and review of laboratory studies, ordering and review of radiographic studies, pulse oximetry, re-evaluation of patient's condition and review of old charts      Critical Care performed: yes ____________________________________________   INITIAL IMPRESSION / [place] / ED COURSE  Pertinent labs & imaging results that were available during my care of the patient were reviewed by me and considered in my medical decision making (see chart for details).   DDX: Asthma, copd, CHF, electrolyte abn, pna, ptx, malignancy, Pe, anemia   Phyllis [name] is a 75 y.o. who presents to the ED with respiratory distress as described above.  Placed on BiPAP for respiratory support.  The patient will be placed on continuous pulse oximetry and telemetry for monitoring.  Laboratory evaluation will be sent to evaluate for the above  complaints.     Clinical Course as of 12/13/20 1556  Mon Dec 13, 2020  0945 Patient has been having EKG changes were [place] but no faster rhythm appears to be nonspecific T wave and ST changes likely rate dependent.  She is not having any chest pain.  I have consulted with nephrology as I do think she needs emergent dialysis.  Currently no ICU bed availability.  There is already an inpatient awaiting bed placement in room 17 which is our emergent dialysis room.  This patient is also Covid positive which will delay transitioning her to that room for dialysis.  She does feel like her breathing is getting better on BiPAP.  Will discuss with hospitalist for admission.   [PR]    Clinical Course User Index [PR] Merlyn Lot, MD    The patient was evaluated in Emergency Department today for the symptoms described in the history of present illness. He/she was evaluated in the context of the global COVID-19 pandemic, which necessitated consideration that the patient might be at risk for infection with the SARS-CoV-2 virus that causes COVID-19. Institutional protocols and algorithms that pertain to the evaluation of patients at risk for COVID-19 are in a state of rapid change based on information released by regulatory bodies including the CDC and federal and state organizations. These policies and algorithms were followed during the patient's care in the ED.  As part of my medical  decision making, I reviewed the following data within the [facility] notes reviewed and incorporated, Labs reviewed, notes from prior ED visits and [place] Controlled Substance Database   ____________________________________________   FINAL CLINICAL IMPRESSION(S) / ED DIAGNOSES  Final diagnoses:  Acute respiratory distress  Acute respiratory failure with hypoxia (HCC)  Hyperkalemia      NEW MEDICATIONS STARTED DURING THIS VISIT:  New Prescriptions   No medications on file     Note:   This document was prepared using Dragon voice recognition software and may include unintentional dictation errors.    Merlyn Lot, MD 12/13/20 1556

## 2020-12-13 NOTE — H&P (Signed)
History and Physical    DESLYNN MASCHINO [id number] DOB: 1946/09/30 DOA: 12/13/2020  PCP: [name], MD   Patient coming from: Home  I have personally briefly reviewed patient's old medical records in [place]  Chief Complaint: Shortness of breath  HPI: Phyllis [name] is a 75 y.o. female with medical history significant for ESRD on HD (T/TH/S), chronic diastolic dysfunction CHF, diabetes mellitus, hypertension, severe aortic stenosis, COPD with chronic respiratory failure who presents to the ER via EMS for evaluation of shortness of breath. Patient missed her dialysis treatment on Saturday and has gained about 4 pounds over her dry weight.  EMS was called for evaluation of shortness of breath which started overnight prior to admission and has progressively worsened.  Upon EMS arrival patient was hypoxic and was placed on a nonrebreather mask with pulse oximetry of 71%.  She received bronchodilator therapy with improvement in her pulse oximetry to 95%.  She was noted to be in respiratory distress with increased work of breathing and had conversational dyspnea and was placed on a BiPAP to reduce her work of breathing. She complains of nausea but denies having any vomiting, she denies having any chest pain, no constipation, no diaphoresis, no fever, no chills, no cough, no urinary frequency, no nocturia, no dysuria, no dizziness, no lightheadedness, no headaches, no blurred vision Labs VBG 7.28/46/135/21.6/98.7 Sodium 133, potassium greater than 7.5, chloride 90, bicarb 22, glucose 116, BUN 75, creatinine 9.13, calcium 8.1, BNP 2514, troponin 58, white count 17.0, hemoglobin 11.8, hematocrit 38.3, MCV 90.8, RDW 14.3, platelet count 331 SARS coronavirus 2 PCR test is pending Chest x-ray reviewed by me shows diffuse interstitial opacity with bibasilar predominant alveolar opacities, which likely reflects pulmonary edema. Twelve-lead EKG reviewed by me showed episodes of  atrial flutter alternating with junctional rhythm    ED Course: Patient is a 75 year old Caucasian female with a history of end-stage renal disease on hemodialysis (dialysis days are T/TH/S) who presents to the ER for evaluation of worsening shortness of breath.  Patient had missed her dialysis session over the weekend.  She has signs of fluid overload, metabolic acidosis significant hyperkalemia with EKG changes and will need emergent hemodialysis.  Nephrology has been consulted.   Review of Systems: As per HPI otherwise all systems reviewed and negative.    Past Medical History:  Diagnosis Date  . (HFpEF) heart failure with preserved ejection fraction ([place])    a. TTE 01/2014: nl LV sys fxn, no valvular abnormalities; b. TTE 11/16: nl EF, mild LVH;  c. 04/2019 Echo: EF 60-65%. DD. Nl RV fxn. Mod AS. 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  . Allergy   . Anemia of chronic disease   . Anxiety   . Aortic atherosclerosis ([place])   . Asthma   . Chronic back pain   . COPD (chronic obstructive pulmonary disease) ([place])   . Diabetes mellitus with complication ([place])   . ESRD on hemodialysis ([place])    a. Tues/Sat; b. 2/2 small kidneys  . Essential hypertension   . Fistula    lower left arm  . GERD (gastroesophageal reflux disease)   . Gout   . History of exercise stress test    a. 01/2014: no evidence of ischemia; b. Lexiscan 08/2015: no sig ischemia, severe GI uptake artifact, low risk; c. CPET @ [facility] 09/2016: exercised 3 min 12 sec on bike without incline, 2.28 METs, VO2 of 8.1, 48% of predicted, indicating mod to sev functional impairment, evidence  of blunted HR, stroke volume, and BP augmentation as well as ventilation-perfusion mismatch with exercise  . HLD (hyperlipidemia)   . Non-obstructive Carotid arterial disease ([place])    a. 12/2017: <50% bilat ICA dzs.  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 Severe aortic stenosis    a. by 09/2019 echo b. 04/2019  Echo: Mod AS.  Marland Kitchen Sleep apnea     Past Surgical History:  Procedure Laterality Date  . CARDIAC CATHETERIZATION    . carpel tunnel    . GALLBLADDER SURGERY    . PERIPHERAL VASCULAR CATHETERIZATION N/A 04/12/2015   Procedure: A/V Shuntogram/Fistulagram;  Surgeon: Algernon Huxley, MD;  Location: [facility];  Service: Cardiovascular;  Laterality: N/A;  . PERIPHERAL VASCULAR CATHETERIZATION N/A 04/12/2015   Procedure: A/V Shunt Intervention;  Surgeon: Algernon Huxley, MD;  Location: [facility];  Service: Cardiovascular;  Laterality: N/A;  . PERIPHERAL VASCULAR CATHETERIZATION N/A 06/09/2015   Procedure: Dialysis/Perma Catheter Removal;  Surgeon: Katha Cabal, MD;  Location: [facility];  Service: Cardiovascular;  Laterality: N/A;  . RIGHT/LEFT HEART CATH AND CORONARY ANGIOGRAPHY N/A 10/14/2019   Procedure: RIGHT/LEFT HEART CATH AND CORONARY ANGIOGRAPHY;  Surgeon: Belva Crome, MD;  Location: [facility];  Service: Cardiovascular;  Laterality: N/A;     reports that she has never smoked. She has never used smokeless tobacco. She reports that she does not drink alcohol and does not use drugs.  Allergies  Allergen Reactions  . Enalapril Maleate Other (See Comments)    Other reaction(s): Headache  . Nitrofurantoin Swelling and Rash    Other Reaction: swelling of body  . Sulfamethoxazole-Trimethoprim Swelling  . 2,4-D Dimethylamine (Amisol) Rash and Other (See Comments)    Other Reaction: h/a  . Baclofen Other (See Comments) and Nausea Only    lightheadness ,drowsiness , muscle weakness , twitching in hands   . Bactrim [Sulfamethoxazole-Trimethoprim] Swelling  . Macrodantin [Nitrofurantoin Macrocrystal] Swelling  . Neosporin [Neomycin-Bacitracin Zn-Polymyx] Other (See Comments) and Rash    Other Reaction: irritation Skin irritation   . Quinine Nausea And Vomiting, Rash and Other (See Comments)    Other Reaction: Vomiting, rash, h/a, vision  . Quinine  Derivatives Nausea And Vomiting and Other (See Comments)    Vertigo, Headache   . Ultram [Tramadol] Palpitations  . Zocor [Simvastatin] Other (See Comments) and Rash    Other Reaction: muscle spasms Muscle pain and spasms  . Levodopa Other (See Comments)    Reaction: unknown    Family History  Problem Relation Age of Onset  . Hypertension Mother   . Hyperlipidemia Mother   . Heart disease Father   . Heart attack Father 73  . Hypertension Father   . Hyperlipidemia Father   . Heart disease Brother        CABG   . Heart attack Brother   . Breast cancer Neg Hx      Prior to Admission medications   Medication Sig Start Date End Date Taking? Authorizing Provider  acetaminophen (TYLENOL) 500 MG tablet Take 500 mg by mouth every 6 (six) hours as needed.    [provider]  albuterol (PROVENTIL HFA;VENTOLIN HFA) 108 (90 Base) MCG/ACT inhaler Inhale 2 puffs into the lungs every 6 (six) hours as needed for wheezing or shortness of breath. Out of medication    [provider]  aspirin (ASPIRIN 81) 81 MG EC tablet Take 1 tablet by mouth daily.  01/27/20   [provider]  busPIRone (BUSPAR) 5 MG tablet Take 5 mg by mouth 2 (two) times daily. 09/08/20   [provider]  carvedilol (COREG) 3.125 MG tablet Take 1 tablet (3.125 mg total) by mouth 2 (two) times daily with a meal. 04/30/20   Dhungel, Nishant, MD  cetirizine (ZYRTEC) 10 MG tablet Take 10 mg by mouth daily. 09/29/20   [provider]  cyclobenzaprine (FLEXERIL) 10 MG tablet Take 10 mg by mouth daily as needed for muscle spasms.    [provider]  epoetin alfa (EPOGEN) 10000 UNIT/ML injection Inject 0.4 mLs (4,000 Units total) into the vein Every Tuesday,Thursday,and Saturday with dialysis. 04/03/20   Nolberto Hanlon, MD  HYDROcodone-acetaminophen (NORCO) 7.5-325 MG tablet Take 1 tablet by mouth every 6 (six) hours as needed (pain). 10/08/19   [place]ford, Suann Larry, MD   ipratropium-albuterol (DUONEB) 0.5-2.5 (3) MG/3ML SOLN Take 3 mLs by nebulization every 6 (six) hours as needed. 10/17/19   [facility], Tami Lin, PA  isosorbide mononitrate (IMDUR) 60 MG 24 hr tablet Take 1 tablet (60 mg total) by mouth daily. 09/04/20   Wouk, Ailene Rud, MD  lidocaine (LIDODERM) 5 % Place 1 patch onto the skin daily. Remove & Discard patch within 12 hours or as directed by MD 10/14/19   Flora Lipps, MD  lidocaine-prilocaine (EMLA) cream Apply 1 application topically as needed (prior to treatment).     [provider]  losartan (COZAAR) 50 MG tablet Take 50 mg by mouth daily. 10/10/20   [provider]  Melatonin 5 MG CHEW Chew 1 tablet by mouth at bedtime.    [provider]  mirtazapine (REMERON) 15 MG tablet Take 15 mg by mouth at bedtime. 02/25/20   [provider]  nitroGLYCERIN (NITROSTAT) 0.4 MG SL tablet Place 1 tablet (0.4 mg total) under the tongue every 5 (five) minutes as needed for chest pain. 09/02/20   Minna Merritts, MD  oxybutynin (DITROPAN XL) 15 MG 24 hr tablet Take 15 mg by mouth daily. 12/24/17   [provider]  pravastatin (PRAVACHOL) 40 MG tablet Take 40 mg by mouth at bedtime.     [provider]  senna-docusate (SENOKOT-S) 8.6-50 MG tablet Take 1 tablet by mouth daily.    [provider]  sertraline (ZOLOFT) 50 MG tablet Take 50 mg by mouth at bedtime. 03/02/20   [provider]  torsemide (DEMADEX) 20 MG tablet Take by mouth. 09/16/20   [provider]    Physical Exam: Vitals:   12/13/20 0830 12/13/20 0833 12/13/20 0836 12/13/20 1000  BP:   (!) 118/53 (!) 116/51  Pulse:    60  Resp:    18  Temp:      TempSrc:      SpO2: 100%   100%  Weight:  81.6 kg       Vitals:   12/13/20 0830 12/13/20 0833 12/13/20 0836 12/13/20 1000  BP:   (!) 118/53 (!) 116/51  Pulse:    60  Resp:    18  Temp:      TempSrc:      SpO2: 100%   100%  Weight:  81.6 kg       Constitutional: NAD, alert and oriented x 3.  Acutely ill-appearing Eyes: PERRL, lids and conjunctivae normal ENMT: Mucous membranes are moist.  Neck: normal, supple, no masses, no thyromegaly Respiratory: Bilateral air entry, scattered wheezing, crackles at the bases. Normal respiratory effort. No accessory muscle use.  Cardiovascular: Tachycardia, systolic ejection murmurs / rubs / gallops. No extremity edema. 2+ pedal pulses. No carotid bruits.  Abdomen: no tenderness, no masses palpated. No hepatosplenomegaly. Bowel sounds positive.  Musculoskeletal: no clubbing / cyanosis. No joint deformity upper and lower extremities.  Skin: no rashes, lesions, ulcers.  Neurologic: No gross focal neurologic deficit. Psychiatric: Normal mood and affect.   Labs on Admission: I have personally reviewed following labs and imaging studies  CBC: Recent Labs  Lab 12/13/20 0829  WBC 17.0*  NEUTROABS 13.4*  HGB 11.8*  HCT 38.3  MCV 90.8  PLT [id number]   Basic Metabolic Panel: Recent Labs  Lab 12/13/20 0829  NA 133*  K >7.5*  CL 90*  CO2 22  GLUCOSE 116*  BUN 75*  CREATININE 9.13*  CALCIUM 8.1*   GFR: Estimated Creatinine Clearance: 4.9 mL/min (A) (by C-G formula based on SCr of 9.13 mg/dL (H)). Liver Function Tests: No results for input(s): AST, ALT, ALKPHOS, BILITOT, PROT, ALBUMIN in the last 168 hours. No results for input(s): LIPASE, AMYLASE in the last 168 hours. No results for input(s): AMMONIA in the last 168 hours. Coagulation Profile: No results for input(s): INR, PROTIME in the last 168 hours. Cardiac Enzymes: No results for input(s): CKTOTAL, CKMB, CKMBINDEX, TROPONINI in the last 168 hours. BNP (last 3 results) No results for input(s): PROBNP in the last 8760 hours. HbA1C: No results for input(s): HGBA1C in the last 72 hours. CBG: No results for input(s): GLUCAP in the last 168 hours. Lipid Profile: No results for input(s): CHOL, HDL, LDLCALC, TRIG, CHOLHDL, LDLDIRECT  in the last 72 hours. Thyroid Function Tests: No results for input(s): TSH, T4TOTAL, FREET4, T3FREE, THYROIDAB in the last 72 hours. Anemia Panel: No results for input(s): VITAMINB12, FOLATE, FERRITIN, TIBC, IRON, RETICCTPCT in the last 72 hours. Urine analysis:    Component Value Date/Time   COLORURINE YELLOW (A) 12/04/2019 2054   APPEARANCEUR CLOUDY (A) 12/04/2019 2054   LABSPEC 1.008 12/04/2019 2054   PHURINE 8.0 12/04/2019 2054   GLUCOSEU NEGATIVE 12/04/2019 2054   HGBUR MODERATE (A) 12/04/2019 2054   BILIRUBINUR NEGATIVE 12/04/2019 2054   [place] 12/04/2019 2054   PROTEINUR 100 (A) 12/04/2019 2054   NITRITE NEGATIVE 12/04/2019 2054   LEUKOCYTESUR LARGE (A) 12/04/2019 2054    Radiological Exams on Admission: DG Chest Portable 1 View  Result Date: 12/13/2020 CLINICAL DATA:  Respiratory distress, shortness of breath at dialysis missed dialysis Saturday and today EXAM: PORTABLE CHEST 1 VIEW COMPARISON:  Chest radiograph August 30, 2020 FINDINGS: The heart size and mediastinal contours are enlarged. Diffuse interstitial opacity with bibasilar predominant alveolar opacities no significant pleural effusion or pneumothorax. The visualized skeletal structures are unremarkable. IMPRESSION: Diffuse interstitial opacity with bibasilar predominant alveolar opacities, which likely reflects pulmonary edema. Electronically Signed   By: Dahlia Bailiff MD   On: 12/13/2020 09:05    EKG: Independently reviewed.  Sinus tachycardia   Assessment/Plan Principal Problem:   Respiratory failure, acute-on-chronic (HCC) Active Problems:   ESRD (end stage renal disease) on dialysis (HCC)   Aortic valve stenosis   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-stage renal disease) (HCC)   Acute on chronic diastolic (congestive) heart failure (HCC)   Type II diabetes mellitus with renal manifestations ([place])     Acute on chronic respiratory failure Secondary to acute diastolic dysfunction CHF from missed  dialysis Patient presented for evaluation of shortness of breath and was noted to have increased work of breathing,conversational dyspnea and use of accessory muscles She was hypoxic  in the field and on nonrebreather mask had a pulse oximetry of 71%. 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 She will require emergency dialysis today Check blood sugars with meals    Hyperkalemia Symptomatic with EKG changes Secondary to missed dialysis Patient was treated with dextrose, insulin, Lokelma, sodium bicarbonate, albuterol and calcium gluconate Emergency dialysis is planned Hold Cozaar    DVT prophylaxis: Heparin Code Status: Full code Family Communication: Greater than 50% of time was spent discussing patient's condition and plan of care with her at the bedside.  All questions and concerns have been addressed.  She verbalizes understanding and agrees with the plan. Disposition Plan: Back to previous home environment Consults called: Nephrology/Cardiology    Jadesola Poynter MD [facility]     12/13/2020, 10:33 AM

## 2020-12-13 NOTE — ED Notes (Signed)
Pt in bed, eyes closed resting. Pt in NAD. Pt has ipad on lap with TV show on it.

## 2020-12-13 NOTE — ED Notes (Signed)
Pt with HR 117 with cardiac monitor changes/EKG changes. Dr Quentin Cornwall notified. Pt alert

## 2020-12-14 DIAGNOSIS — N186 End stage renal disease: Secondary | ICD-10-CM | POA: Diagnosis not present

## 2020-12-14 DIAGNOSIS — I472 Ventricular tachycardia: Secondary | ICD-10-CM | POA: Insufficient documentation

## 2020-12-14 DIAGNOSIS — I4729 Other ventricular tachycardia: Secondary | ICD-10-CM | POA: Insufficient documentation

## 2020-12-14 DIAGNOSIS — J9620 Acute and chronic respiratory failure, unspecified whether with hypoxia or hypercapnia: Secondary | ICD-10-CM | POA: Diagnosis not present

## 2020-12-14 DIAGNOSIS — I5033 Acute on chronic diastolic (congestive) heart failure: Secondary | ICD-10-CM | POA: Diagnosis not present

## 2020-12-14 LAB — BASIC METABOLIC PANEL
Anion gap: 18 — ABNORMAL HIGH (ref 5–15)
BUN: 47 mg/dL — ABNORMAL HIGH (ref 8–23)
CO2: 28 mmol/L (ref 22–32)
Calcium: 8.2 mg/dL — ABNORMAL LOW (ref 8.9–10.3)
Chloride: 91 mmol/L — ABNORMAL LOW (ref 98–111)
Creatinine, Ser: 6.3 mg/dL — ABNORMAL HIGH (ref 0.44–1.00)
GFR, Estimated: 6 mL/min — ABNORMAL LOW (ref >60)
Glucose, Bld: 87 mg/dL (ref 70–99)
Potassium: 4.5 mmol/L (ref 3.5–5.1)
Sodium: 137 mmol/L (ref 135–145)

## 2020-12-14 LAB — HEPATITIS B SURFACE ANTIBODY, QUANTITATIVE: Hep B S AB Quant (Post): 22.5 m[IU]/mL (ref >9.9)

## 2020-12-14 NOTE — ED Notes (Signed)
Patient transported to Ultrasound 

## 2020-12-14 NOTE — Discharge Summary (Addendum)
Physician Discharge Summary  Krys Fusselman Jablonski [id number] DOB: [birth year] DOA: 12/13/2020  PCP: [name], MD  Admit date: 12/13/2020.  Discharge date: 12/14/2020  Admitted From: Home Disposition: Home.  Recommendations for Outpatient Follow-up:  1. Follow up with PCP in 1-2 weeks. 2. Please obtain BMP/CBC in one week. 3. Advised to have hemodialysis in outpatient HD unit today. 4. Advised to follow up cardiology and nephrology as outpatient.  Home Health: None Equipment/Devices: Oxygen @ 2l/m  Discharge Condition: Stable CODE STATUS:Full code Diet recommendation: Heart Healthy   Brief Summary: This 75 y.o. female with PMH significant for ESRD on HD (T/TH/S), chronic diastolic CHF, diabetes mellitus, hypertension, severe aortic stenosis, COPD with chronic respiratory failure who presents to the ER via EMS for evaluation of shortness of breath. Patient missed her dialysis treatment on Saturday and has gained about 4 pounds over her dry weight.  Upon EMS arrival patient was hypoxic and was placed on  nonrebreather mask with pulse oximetry of 71%.  She received bronchodilator therapy with improvement in her pulse oximetry to 95%.  She was noted to be in respiratory distress with increased work of breathing and had conversational dyspnea and was placed on a BiPAP to reduce her work of breathing. Patient was admitted for acute on chronic hypoxic respiratory failure secondary to COPD and possible CHF exacerbation.  Cardiology and Nephrology was consulted.  Patient seemed improved. Patient have had HD x 1, needed hemodialysis next day.  Nephrology has arranged hemodialysis in the outpatient unit. Patient was cleared from cardiology to be discharged. Patient is being discharged to hemodialysis unit and she will be discharged home from there. Patient has recovered quickly and was discharged within 48 hours.  She was managed for below problems in the hospital.  Discharge Diagnoses:   Principal Problem:   Respiratory failure, acute-on-chronic ([place]) Active Problems:   ESRD (end stage renal disease) on dialysis ([place])   Aortic valve stenosis   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-stage renal disease) ([facility])   Acute on chronic diastolic (congestive) heart failure ([facility])   Type II diabetes mellitus with renal manifestations ([place])  Acute on chronic respiratory failure:  Secondary to acute diastolic dysfunction CHF from missed dialysis Patient presented for evaluation of shortness of breath and was noted to have increased work of breathing,conversational dyspnea and use of accessory muscles She was hypoxic in the field and on nonrebreather mask had a pulse oximetry of 71%. She is currently on BiPAP to reduce work of breathing with improvement in her oxygenation We will attempt to wean off BiPAP as tolerated.    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 She will require emergency dialysis today Check blood sugars with meals.    Hyperkalemia : Resolved. Symptomatic with EKG changes Secondary to missed dialysis Patient was treated with dextrose, insulin, Lokelma, sodium bicarbonate, albuterol and calcium gluconate Emergency dialysis is planned Hold Cozaar   Discharge Instructions  Discharge Instructions    Call MD for:  difficulty breathing, headache or visual disturbances   Complete by: As directed    Call MD  for:  persistant dizziness or light-headedness    Complete by: As directed    Call MD for:  persistant nausea and vomiting   Complete by: As directed    Call MD for:  redness, tenderness, or signs of infection (pain, swelling, redness, odor or green/yellow discharge around incision site)   Complete by: As directed    Call MD for:  temperature >100.4   Complete by: As directed    Diet - low sodium heart healthy   Complete by: As directed    Diet Carb Modified   Complete by: As directed    Discharge instructions   Complete by: As directed    Advised to follow up PCP in one week. Advised to have hemodialysis in outpatient HD unit. Advised to follow up cardiology and nephrology as outpatient   Increase activity slowly   Complete by: As directed      Allergies as of 12/14/2020      Reactions   Enalapril Maleate Other (See Comments)   Other reaction(s): Headache   Nitrofurantoin Swelling, Rash   Other Reaction: swelling of body   Sulfamethoxazole-trimethoprim Swelling   2,4-d Dimethylamine (amisol) Rash, Other (See Comments)   Other Reaction: h/a   Baclofen Other (See Comments), Nausea Only   lightheadness ,drowsiness , muscle weakness , twitching in hands    Bactrim [sulfamethoxazole-trimethoprim] Swelling   Macrodantin [nitrofurantoin Macrocrystal] Swelling   Neosporin [neomycin-bacitracin Zn-polymyx] Other (See Comments), Rash   Other Reaction: irritation Skin irritation   Quinine Nausea And Vomiting, Rash, Other (See Comments)   Other Reaction: Vomiting, rash, h/a, vision   Quinine Derivatives Nausea And Vomiting, Other (See Comments)   Vertigo, Headache   Ultram [tramadol] Palpitations   Zocor [simvastatin] Other (See Comments), Rash   Other Reaction: muscle spasms Muscle pain and spasms   Levodopa Other (See Comments)   Reaction: unknown      Medication List    TAKE these medications   acetaminophen 500 MG tablet Commonly known as: TYLENOL Take 500-1,000 mg by mouth every 6 (six) hours as needed for mild pain,  moderate pain or fever.   albuterol 108 (90 Base) MCG/ACT inhaler Commonly known as: VENTOLIN HFA Inhale 2 puffs into the lungs every 6 (six) hours as needed for wheezing or shortness of breath. Out of medication   aspirin 81 MG EC tablet Take 81 mg by mouth daily.   busPIRone 5 MG tablet Commonly known as: BUSPAR Take 5 mg by mouth 2 (two) times daily.   calcium acetate 667 MG capsule Commonly known as: PHOSLO Take 1,334 mg by mouth 3 (three) times daily with meals.   carvedilol 3.125 MG tablet Commonly known as: COREG Take 1 tablet (3.125 mg total) by mouth 2 (two) times daily with a meal.   cetirizine 10 MG tablet Commonly known as: ZYRTEC Take 10 mg by mouth daily.   cyclobenzaprine 10 MG tablet Commonly known as: FLEXERIL Take 10 mg by mouth daily as needed for muscle spasms.   epoetin alfa 10000 UNIT/ML injection Commonly known as: EPOGEN Inject 0.4 mLs (4,000 Units total) into the vein Every Tuesday,Thursday,and Saturday with dialysis.   HYDROcodone-acetaminophen 7.5-325 MG tablet Commonly known as: NORCO Take 1-2 tablets by mouth daily as needed for pain.   ipratropium-albuterol 0.5-2.5 (3) MG/3ML Soln Commonly known as: DUONEB Take 3 mLs by nebulization every 6 (six) hours as needed. What changed: reasons to take this   isosorbide mononitrate 60 MG 24 hr tablet Commonly known as: IMDUR Take 1  tablet (60 mg total) by mouth daily.   lidocaine 5 % Commonly known as: LIDODERM Place 1 patch onto the skin daily. Remove & Discard patch within 12 hours or as directed by MD   lidocaine-prilocaine cream Commonly known as: EMLA Apply 1 application topically as needed (prior to treatment).   losartan 50 MG tablet Commonly known as: COZAAR Take 50 mg by mouth daily.   Melatonin 5 MG Chew Chew 1 tablet by mouth at bedtime.   mirtazapine 15 MG tablet Commonly known as: REMERON Take 15 mg by mouth at bedtime.   nitroGLYCERIN 0.4 MG SL tablet Commonly known  as: NITROSTAT Place 1 tablet (0.4 mg total) under the tongue every 5 (five) minutes as needed for chest pain.   oxybutynin 15 MG 24 hr tablet Commonly known as: DITROPAN XL Take 15 mg by mouth daily.   pravastatin 40 MG tablet Commonly known as: PRAVACHOL Take 40 mg by mouth at bedtime.   senna-docusate 8.6-50 MG tablet Commonly known as: Senokot-S Take 1 tablet by mouth daily.   sertraline 50 MG tablet Commonly known as: ZOLOFT Take 50 mg by mouth at bedtime.   torsemide 20 MG tablet Commonly known as: DEMADEX Take 40 mg by mouth daily.       Follow-up Information    [name], MD Follow up in 1 week(s).   Specialty: Internal Medicine Contact information: [address] [place] [address] [phone]        Minna Merritts, MD .   Specialty: Cardiology Contact information: [place] [address] [phone]        Lavonia Dana, MD Follow up.   Specialty: Nephrology Contact information: [address] [place] [address] [phone]              Allergies  Allergen Reactions  . Enalapril Maleate Other (See Comments)    Other reaction(s): Headache  . Nitrofurantoin Swelling and Rash    Other Reaction: swelling of body  . Sulfamethoxazole-Trimethoprim Swelling  . 2,4-D Dimethylamine (Amisol) Rash and Other (See Comments)    Other Reaction: h/a  . Baclofen Other (See Comments) and Nausea Only    lightheadness ,drowsiness , muscle weakness , twitching in hands   . Bactrim [Sulfamethoxazole-Trimethoprim] Swelling  . Macrodantin [Nitrofurantoin Macrocrystal] Swelling  . Neosporin [Neomycin-Bacitracin Zn-Polymyx] Other (See Comments) and Rash    Other Reaction: irritation Skin irritation   . Quinine Nausea And Vomiting, Rash and Other (See Comments)    Other Reaction: Vomiting, rash, h/a, vision  . Quinine Derivatives Nausea And Vomiting and Other (See  Comments)    Vertigo, Headache   . Ultram [Tramadol] Palpitations  . Zocor [Simvastatin] Other (See Comments) and Rash    Other Reaction: muscle spasms Muscle pain and spasms  . Levodopa Other (See Comments)    Reaction: unknown    Consultations:  Nephrology  Cardiology   Procedures/Studies: CT SOFT TISSUE NECK WO CONTRAST  Result Date: 12/01/2020 CLINICAL DATA:  Left ear pain 3-4 weeks.  Tinnitus. EXAM: CT NECK WITHOUT CONTRAST TECHNIQUE: Multidetector CT imaging of the neck was performed following the standard protocol without intravenous contrast. COMPARISON:  CT angio head neck 01/17/2018 FINDINGS: Pharynx and larynx: Tonsilliths bilaterally. No pharyngeal mass or edema. Normal larynx. Salivary glands: No inflammation, mass, or stone. Thyroid: Bilateral thyroid nodules noted on the prior enhanced study. These are not visualized on the unenhanced study. Thyroid gland normal in size Lymph nodes:  Negative for enlarged lymph nodes in the neck. Vascular: Limited vascular evaluation without intravenous contrast. There is extensive atherosclerotic disease in the aortic arch and carotid arteries bilaterally. Vessel patency not assessed. Limited intracranial: Negative Visualized orbits: Negative Mastoids and visualized paranasal sinuses: Chronic mucoperiosteal thickening left sphenoid sinus. Remaining paranasal sinuses clear. Mastoid sinus and middle ear clear bilaterally. Skeleton: Negative Upper chest: Mild hazy ground-glass density in both lung apices similar to that seen on the prior study. Other: None IMPRESSION: Negative for mass or adenopathy in the neck. Mastoid sinus and middle ear clear bilaterally. Mucoperiosteal thickening in the sphenoid sinus. Bilateral tonsilliths.  No peritonsillar mass or abscess. Advanced atherosclerotic calcification aorta and carotid arteries bilaterally Bilateral thyroid nodules best seen on the prior enhanced CT. Electronically Signed   By: Franchot Gallo M.D.    On: 12/01/2020 15:46   DG Chest Portable 1 View  Result Date: 12/13/2020 CLINICAL DATA:  Respiratory distress, shortness of breath at dialysis missed dialysis Saturday and today EXAM: PORTABLE CHEST 1 VIEW COMPARISON:  Chest radiograph August 30, 2020 FINDINGS: The heart size and mediastinal contours are enlarged. Diffuse interstitial opacity with bibasilar predominant alveolar opacities no significant pleural effusion or pneumothorax. The visualized skeletal structures are unremarkable. IMPRESSION: Diffuse interstitial opacity with bibasilar predominant alveolar opacities, which likely reflects pulmonary edema. Electronically Signed   By: Dahlia Bailiff MD   On: 12/13/2020 09:05   ECHOCARDIOGRAM COMPLETE  Result Date: 12/13/2020    ECHOCARDIOGRAM REPORT   Patient Name:   Phyllis [name] Date of Exam: 12/13/2020 Medical Rec #:  [id number]           Height:       58.0 in Accession #:    [id number]          Weight:       180.0 lb Date of Birth:  1946-05-26            BSA:          1.741 m Patient Age:    75 years            BP:           112/49 mmHg Patient Gender: F                   HR:           60 bpm. Exam Location:  [facility] Procedure: 2D Echo, Color Doppler and Cardiac Doppler Indications:     I50.31 CHF-Acute Diastolic  History:         Patient has prior history of Echocardiogram examinations, most                  recent 09/01/2020. HFpEF, CAD, ESRD; Risk Factors:Sleep Apnea,                  Hypertension and Dyslipidemia.  Sonographer:     Charmayne Sheer RDCS (AE) Referring Phys:  [id number] Collier Bullock Diagnosing Phys: Kathlyn Sacramento MD  Sonographer Comments: Suboptimal apical window and no subcostal window. IMPRESSIONS  1. Left ventricular ejection fraction, by estimation, is 50 to 55%. The left ventricle has low normal function. The left ventricle has no regional wall motion abnormalities. There is mild left ventricular hypertrophy. Left ventricular diastolic parameters are indeterminate.  2. Right  ventricular systolic function is normal. The right ventricular size is normal. Tricuspid regurgitation signal is inadequate for assessing PA pressure.  3. Left atrial size was mildly dilated.  4. The mitral valve is  normal in structure. Trivial mitral valve regurgitation. No evidence of mitral stenosis. Moderate mitral annular calcification.  5. The aortic valve is normal in structure. Aortic valve regurgitation is not visualized. Moderate to severe aortic valve stenosis. Aortic valve area, by VTI measures 0.74 cm. Aortic valve mean gradient measures 18.0 mmHg. FINDINGS  Left Ventricle: Left ventricular ejection fraction, by estimation, is 50 to 55%. The left ventricle has low normal function. The left ventricle has no regional wall motion abnormalities. The left ventricular internal cavity size was normal in size. There is mild left ventricular hypertrophy. Left ventricular diastolic parameters are indeterminate. Right Ventricle: The right ventricular size is normal. No increase in right ventricular wall thickness. Right ventricular systolic function is normal. Tricuspid regurgitation signal is inadequate for assessing PA pressure. Left Atrium: Left atrial size was mildly dilated. Right Atrium: Right atrial size was normal in size. Pericardium: There is no evidence of pericardial effusion. Mitral Valve: The mitral valve is normal in structure. Moderate mitral annular calcification. Trivial mitral valve regurgitation. No evidence of mitral valve stenosis. MV peak gradient, 7.1 mmHg. The mean mitral valve gradient is 2.0 mmHg. Tricuspid Valve: The tricuspid valve is normal in structure. Tricuspid valve regurgitation is trivial. No evidence of tricuspid stenosis. Aortic Valve: The aortic valve is normal in structure. Aortic valve regurgitation is not visualized. Moderate to severe aortic stenosis is present. Aortic valve mean gradient measures 18.0 mmHg. Aortic valve peak gradient measures 34.3 mmHg. Aortic valve   area, by VTI measures 0.74 cm. Pulmonic Valve: The pulmonic valve was normal in structure. Pulmonic valve regurgitation is trivial. No evidence of pulmonic stenosis. Aorta: The aortic root is normal in size and structure. Venous: The inferior vena cava was not well visualized. IAS/Shunts: No atrial level shunt detected by color flow Doppler.  LEFT VENTRICLE PLAX 2D LVIDd:         3.66 cm LVIDs:         2.86 cm LV PW:         1.07 cm LV IVS:        0.99 cm LVOT diam:     2.10 cm LV SV:         42 LV SV Index:   24 LVOT Area:     3.46 cm  LEFT ATRIUM             Index LA diam:        3.30 cm 1.90 cm/m LA Vol (A2C):   38.2 ml 21.94 ml/m LA Vol (A4C):   65.6 ml 37.67 ml/m LA Biplane Vol: 53.7 ml 30.84 ml/m  AORTIC VALVE                    PULMONIC VALVE AV Area (Vmax):    0.79 cm     PV Vmax:       0.67 m/s AV Area (Vmean):   0.71 cm     PV Vmean:      47.400 cm/s AV Area (VTI):     0.74 cm     PV VTI:        0.145 m AV Vmax:           293.00 cm/s  PV Peak grad:  1.8 mmHg AV Vmean:          199.000 cm/s PV Mean grad:  1.0 mmHg AV VTI:            0.569 m AV Peak Grad:      34.3 mmHg  AV Mean Grad:      18.0 mmHg LVOT Vmax:         66.70 cm/s LVOT Vmean:        40.900 cm/s LVOT VTI:          0.121 m LVOT/AV VTI ratio: 0.21  AORTA Ao Root diam: 2.90 cm MITRAL VALVE MV Area (PHT): 3.18 cm     SHUNTS MV Area VTI:   1.51 cm     Systemic VTI:  0.12 m MV Peak grad:  7.1 mmHg     Systemic Diam: 2.10 cm MV Mean grad:  2.0 mmHg MV Vmax:       1.33 m/s MV Vmean:      57.8 cm/s MV Decel Time: 239 msec MV E velocity: 145.50 cm/s MV A velocity: 30.60 cm/s MV E/A ratio:  4.75 Kathlyn Sacramento MD Electronically signed by Kathlyn Sacramento MD Signature Date/Time: 12/13/2020/3:04:48 PM    Final       Subjective: Patient was seen and examined at bed side, She reports feeling much better, Cleared from cardiology and nephrology to be discharged and have outpatient HD today.  Discharge Exam: Vitals:   12/14/20 0800 12/14/20 0822   BP: (!) 146/60 140/60  Pulse: 73 71  Resp: (!) 21 (!) 21  Temp:    SpO2: 100% 100%   Vitals:   12/14/20 0515 12/14/20 0600 12/14/20 0800 12/14/20 0822  BP: (!) 145/57 (!) 151/54 (!) 146/60 140/60  Pulse: 74 72 73 71  Resp: 19 (!) 21 (!) 21 (!) 21  Temp:      TempSrc:      SpO2: 100% 99% 100% 100%  Weight:        General: Pt is alert, awake, not in acute distress Cardiovascular: RRR, S1/S2 +, no rubs, no gallops Respiratory: CTA bilaterally, no wheezing, no rhonchi Abdominal: Soft, NT, ND, bowel sounds + Extremities: no edema, no cyanosis    The results of significant diagnostics from this hospitalization (including imaging, microbiology, ancillary and laboratory) are listed below for reference.     Microbiology: Recent Results (from the past 240 hour(s))  C Difficile Quick Screen w PCR reflex     Status: None   Collection Time: 12/13/20  6:18 PM   Specimen: STOOL  Result Value Ref Range Status   C Diff antigen NEGATIVE NEGATIVE Final   C Diff toxin NEGATIVE NEGATIVE Final   C Diff interpretation No C. difficile detected.  Final    Comment: Performed at [facility], [address] [place], [place] [address]     Labs: BNP (last 3 results) Recent Labs    08/29/20 0440 08/30/20 2216 12/13/20 0829  BNP 948.0* 1,601.9* [id number]   Basic Metabolic Panel: Recent Labs  Lab 12/13/20 0829 12/14/20 0518  NA 133* 137  K >7.5* 4.5  CL 90* 91*  CO2 22 28  GLUCOSE 116* 87  BUN 75* 47*  CREATININE 9.13* 6.30*  CALCIUM 8.1* 8.2*   Liver Function Tests: No results for input(s): AST, ALT, ALKPHOS, BILITOT, PROT, ALBUMIN in the last 168 hours. No results for input(s): LIPASE, AMYLASE in the last 168 hours. No results for input(s): AMMONIA in the last 168 hours. CBC: Recent Labs  Lab 12/13/20 0829  WBC 17.0*  NEUTROABS 13.4*  HGB 11.8*  HCT 38.3  MCV 90.8  PLT 331   Cardiac Enzymes: No results for input(s): CKTOTAL, CKMB, CKMBINDEX, TROPONINI in  the last 168 hours. BNP: Invalid input(s): POCBNP CBG: No results for input(s):  GLUCAP in the last 168 hours. D-Dimer No results for input(s): DDIMER in the last 72 hours. Hgb A1c No results for input(s): HGBA1C in the last 72 hours. Lipid Profile No results for input(s): CHOL, HDL, LDLCALC, TRIG, CHOLHDL, LDLDIRECT in the last 72 hours. Thyroid function studies No results for input(s): TSH, T4TOTAL, T3FREE, THYROIDAB in the last 72 hours.  Invalid input(s): FREET3 Anemia work up No results for input(s): VITAMINB12, FOLATE, FERRITIN, TIBC, IRON, RETICCTPCT in the last 72 hours. Urinalysis    Component Value Date/Time   COLORURINE YELLOW (A) 12/04/2019 2054   APPEARANCEUR CLOUDY (A) 12/04/2019 2054   LABSPEC 1.008 12/04/2019 2054   PHURINE 8.0 12/04/2019 2054   GLUCOSEU NEGATIVE 12/04/2019 2054   HGBUR MODERATE (A) 12/04/2019 2054   BILIRUBINUR NEGATIVE 12/04/2019 2054   KETONESUR NEGATIVE 12/04/2019 2054   PROTEINUR 100 (A) 12/04/2019 2054   NITRITE NEGATIVE 12/04/2019 2054   LEUKOCYTESUR LARGE (A) 12/04/2019 2054   Sepsis Labs Invalid input(s): PROCALCITONIN,  WBC,  LACTICIDVEN Microbiology Recent Results (from the past 240 hour(s))  C Difficile Quick Screen w PCR reflex     Status: None   Collection Time: 12/13/20  6:18 PM   Specimen: STOOL  Result Value Ref Range Status   C Diff antigen NEGATIVE NEGATIVE Final   C Diff toxin NEGATIVE NEGATIVE Final   C Diff interpretation No C. difficile detected.  Final    Comment: Performed at [facility], [address] [place],  [address]     Time coordinating discharge: Over 30 minutes  SIGNED:   Shawna Clamp, MD  [facility] 12/14/2020, 9:17 AM Pager   If 7PM-7AM, please contact night-coverage [email or web address]

## 2020-12-14 NOTE — ED Notes (Signed)
Provider notified of BP of 132/49

## 2020-12-14 NOTE — ED Notes (Signed)
Patient verbalizes understanding of discharge instructions. Opportunity for questioning and answers were provided. Armband removed by staff, pt discharged from ED. Wheeled out to lobby  

## 2020-12-14 NOTE — Progress Notes (Signed)
[facility]  ROUNDING NOTE   Subjective:   [name] was admitted to [facility] on 12/13/2020 for Respiratory failure, acute-on-chronic ([place]) [id number]  Emergent hemodialysis treatment yesterday. UF of 2 liters. Patient started cramping and ultrafiltration was limited.   Today, breathing comfortably with [place] oxygen.    Objective:  Vital signs in last 24 hours:  Temp:  [98 F (36.7 C)] 98 F (36.7 C) (01/24 1730) Pulse Rate:  [53-93] 71 (01/25 0822) Resp:  [17-37] 21 (01/25 0822) BP: (103-159)/(41-120) 140/60 (01/25 0822) SpO2:  [92 %-100 %] 100 % (01/25 0822)  Weight change:  Filed Weights   12/13/20 0833  Weight: 81.6 kg    Intake/Output: I/O last 3 completed shifts: In: -  Out: 2000 [Other:2000]   Intake/Output this shift:  No intake/output data recorded.  Physical Exam: General: Critically ill  Head: [place]/AT  Eyes: Anicteric, PERRL  Neck: Supple, trachea midline  Lungs:  Bilateral crackles  Heart: Regular rate and rhythm  Abdomen:  Soft, nontender,   Extremities: + peripheral edema.  Neurologic: Nonfocal, moving all four extremities  Skin: No lesions  Access: Left AVF    Basic Metabolic Panel: Recent Labs  Lab 12/13/20 0829 12/14/20 0518  NA 133* 137  K >7.5* 4.5  CL 90* 91*  CO2 22 28  GLUCOSE 116* 87  BUN 75* 47*  CREATININE 9.13* 6.30*  CALCIUM 8.1* 8.2*    Liver Function Tests: No results for input(s): AST, ALT, ALKPHOS, BILITOT, PROT, ALBUMIN in the last 168 hours. No results for input(s): LIPASE, AMYLASE in the last 168 hours. No results for input(s): AMMONIA in the last 168 hours.  CBC: Recent Labs  Lab 12/13/20 0829  WBC 17.0*  NEUTROABS 13.4*  HGB 11.8*  HCT 38.3  MCV 90.8  PLT 331    Cardiac Enzymes: No results for input(s): CKTOTAL, CKMB, CKMBINDEX, TROPONINI in the last 168 hours.  BNP: Invalid input(s): POCBNP  CBG: No results for input(s): GLUCAP in the last 168 hours.  Microbiology: Results  for orders placed or performed during the hospital encounter of 12/13/20  C Difficile Quick Screen w PCR reflex     Status: None   Collection Time: 12/13/20  6:18 PM   Specimen: STOOL  Result Value Ref Range Status   C Diff antigen NEGATIVE NEGATIVE Final   C Diff toxin NEGATIVE NEGATIVE Final   C Diff interpretation No C. difficile detected.  Final    Comment: Performed at [facility], [address] [place], [place] [address]    Coagulation Studies: No results for input(s): LABPROT, INR in the last 72 hours.  Urinalysis: No results for input(s): COLORURINE, LABSPEC, PHURINE, GLUCOSEU, HGBUR, BILIRUBINUR, KETONESUR, PROTEINUR, UROBILINOGEN, NITRITE, LEUKOCYTESUR in the last 72 hours.  Invalid input(s): APPERANCEUR    Imaging: DG Chest Portable 1 View  Result Date: 12/13/2020 CLINICAL DATA:  Respiratory distress, shortness of breath at dialysis missed dialysis Saturday and today EXAM: PORTABLE CHEST 1 VIEW COMPARISON:  Chest radiograph August 30, 2020 FINDINGS: The heart size and mediastinal contours are enlarged. Diffuse interstitial opacity with bibasilar predominant alveolar opacities no significant pleural effusion or pneumothorax. The visualized skeletal structures are unremarkable. IMPRESSION: Diffuse interstitial opacity with bibasilar predominant alveolar opacities, which likely reflects pulmonary edema. Electronically Signed   By: Dahlia Bailiff MD   On: 12/13/2020 09:05   ECHOCARDIOGRAM COMPLETE  Result Date: 12/13/2020    ECHOCARDIOGRAM REPORT   Patient Name:   Phyllis [name] Headings Date of Exam: 12/13/2020  Medical Rec #:  [id number]           Height:       58.0 in Accession #:    [id number]          Weight:       180.0 lb Date of Birth:  Jul 27, 1946            BSA:          1.741 m Patient Age:    75 years            BP:           112/49 mmHg Patient Gender: F                   HR:           60 bpm. Exam Location:  [facility] Procedure: 2D Echo, Color Doppler and Cardiac  Doppler Indications:     I50.31 CHF-Acute Diastolic  History:         Patient has prior history of Echocardiogram examinations, most                  recent 09/01/2020. HFpEF, CAD, ESRD; Risk Factors:Sleep Apnea,                  Hypertension and Dyslipidemia.  Sonographer:     Charmayne Sheer RDCS (AE) Referring Phys:  [id number] Collier Bullock Diagnosing Phys: Kathlyn Sacramento MD  Sonographer Comments: Suboptimal apical window and no subcostal window. IMPRESSIONS  1. Left ventricular ejection fraction, by estimation, is 50 to 55%. The left ventricle has low normal function. The left ventricle has no regional wall motion abnormalities. There is mild left ventricular hypertrophy. Left ventricular diastolic parameters are indeterminate.  2. Right ventricular systolic function is normal. The right ventricular size is normal. Tricuspid regurgitation signal is inadequate for assessing PA pressure.  3. Left atrial size was mildly dilated.  4. The mitral valve is normal in structure. Trivial mitral valve regurgitation. No evidence of mitral stenosis. Moderate mitral annular calcification.  5. The aortic valve is normal in structure. Aortic valve regurgitation is not visualized. Moderate to severe aortic valve stenosis. Aortic valve area, by VTI measures 0.74 cm. Aortic valve mean gradient measures 18.0 mmHg. FINDINGS  Left Ventricle: Left ventricular ejection fraction, by estimation, is 50 to 55%. The left ventricle has low normal function. The left ventricle has no regional wall motion abnormalities. The left ventricular internal cavity size was normal in size. There is mild left ventricular hypertrophy. Left ventricular diastolic parameters are indeterminate. Right Ventricle: The right ventricular size is normal. No increase in right ventricular wall thickness. Right ventricular systolic function is normal. Tricuspid regurgitation signal is inadequate for assessing PA pressure. Left Atrium: Left atrial size was mildly dilated.  Right Atrium: Right atrial size was normal in size. Pericardium: There is no evidence of pericardial effusion. Mitral Valve: The mitral valve is normal in structure. Moderate mitral annular calcification. Trivial mitral valve regurgitation. No evidence of mitral valve stenosis. MV peak gradient, 7.1 mmHg. The mean mitral valve gradient is 2.0 mmHg. Tricuspid Valve: The tricuspid valve is normal in structure. Tricuspid valve regurgitation is trivial. No evidence of tricuspid stenosis. Aortic Valve: The aortic valve is normal in structure. Aortic valve regurgitation is not visualized. Moderate to severe aortic stenosis is present. Aortic valve mean gradient measures 18.0 mmHg. Aortic valve peak gradient measures 34.3 mmHg. Aortic valve  area, by VTI measures 0.74 cm. Pulmonic Valve: The pulmonic valve was  normal in structure. Pulmonic valve regurgitation is trivial. No evidence of pulmonic stenosis. Aorta: The aortic root is normal in size and structure. Venous: The inferior vena cava was not well visualized. IAS/Shunts: No atrial level shunt detected by color flow Doppler.  LEFT VENTRICLE PLAX 2D LVIDd:         3.66 cm LVIDs:         2.86 cm LV PW:         1.07 cm LV IVS:        0.99 cm LVOT diam:     2.10 cm LV SV:         42 LV SV Index:   24 LVOT Area:     3.46 cm  LEFT ATRIUM             Index LA diam:        3.30 cm 1.90 cm/m LA Vol (A2C):   38.2 ml 21.94 ml/m LA Vol (A4C):   65.6 ml 37.67 ml/m LA Biplane Vol: 53.7 ml 30.84 ml/m  AORTIC VALVE                    PULMONIC VALVE AV Area (Vmax):    0.79 cm     PV Vmax:       0.67 m/s AV Area (Vmean):   0.71 cm     PV Vmean:      47.400 cm/s AV Area (VTI):     0.74 cm     PV VTI:        0.145 m AV Vmax:           293.00 cm/s  PV Peak grad:  1.8 mmHg AV Vmean:          199.000 cm/s PV Mean grad:  1.0 mmHg AV VTI:            0.569 m AV Peak Grad:      34.3 mmHg AV Mean Grad:      18.0 mmHg LVOT Vmax:         66.70 cm/s LVOT Vmean:        40.900 cm/s LVOT VTI:           0.121 m LVOT/AV VTI ratio: 0.21  AORTA Ao Root diam: 2.90 cm MITRAL VALVE MV Area (PHT): 3.18 cm     SHUNTS MV Area VTI:   1.51 cm     Systemic VTI:  0.12 m MV Peak grad:  7.1 mmHg     Systemic Diam: 2.10 cm MV Mean grad:  2.0 mmHg MV Vmax:       1.33 m/s MV Vmean:      57.8 cm/s MV Decel Time: 239 msec MV E velocity: 145.50 cm/s MV A velocity: 30.60 cm/s MV E/A ratio:  4.75 Kathlyn Sacramento MD Electronically signed by Kathlyn Sacramento MD Signature Date/Time: 12/13/2020/3:04:48 PM    Final      Medications:   . sodium chloride     . aspirin  324 mg Oral Once  . aspirin EC  81 mg Oral Daily  . busPIRone  5 mg Oral BID  . carvedilol  3.125 mg Oral BID WC  . heparin  5,000 Units Subcutaneous Q8H  . isosorbide mononitrate  60 mg Oral Daily  . lidocaine  1 patch Transdermal Q24H  . loratadine  10 mg Oral Daily  . melatonin  5 mg Oral QHS  . mirtazapine  15 mg Oral QHS  . oxybutynin  15 mg Oral  Daily  . pravastatin  40 mg Oral QHS  . senna-docusate  1 tablet Oral Daily  . sertraline  50 mg Oral QHS  . sodium chloride flush  3 mL Intravenous Q12H  . sodium zirconium cyclosilicate  10 g Oral Daily  . torsemide  20 mg Oral Daily   sodium chloride, acetaminophen, cyclobenzaprine, HYDROcodone-acetaminophen, lidocaine-prilocaine, nitroGLYCERIN, ondansetron (ZOFRAN) IV, sodium chloride flush  Assessment/ Plan:  Phyllis [name] is a 75 y.o. white female with end stage renal disease on hemodialysis, hypertension, aortic stenosis, COPD, diabetes mellitus type II, chronic costochrondritis who was admitted to [facility] on 12/13/2020 for  Respiratory failure, acute-on-chronic ([place]) [J96.20]   [place]. Left AVF 61kg  1. End Stage Renal Disease with hyperkalemia: emergent hemodialysis treatment on admission - patient is set up for outpatient hemodialysis treatment. Patient instructed to be there by 10am.   2. Hypertension: BP readings within acceptable range - On  Carvedilol,Furosemide,Imdur and PRN Hydralazine   3. Anemia with chronic kidney disease:   - EPO as outpateint  4. Secondary Hyperparathyroidism:  -Continue calcium acetate with meals   LOS: 1 Eron Staat 1/25/20228:51 AM

## 2020-12-14 NOTE — Discharge Instructions (Signed)
Advised to follow up PCP in one week. Advised to have hemodialysis in outpatient HD unit. Advised to follow up cardiology and nephrology as outpatient

## 2020-12-14 NOTE — Progress Notes (Signed)
Progress Note  [name] Date of Encounter: 12/14/2020  Primary Cardiologist: Ida Rogue, MD  Subjective   Feeling much better. Breathing much improved.  Eager to go home.  Inpatient Medications    Scheduled Meds: . aspirin  324 mg Oral Once  . aspirin EC  81 mg Oral Daily  . busPIRone  5 mg Oral BID  . carvedilol  3.125 mg Oral BID WC  . heparin  5,000 Units Subcutaneous Q8H  . isosorbide mononitrate  60 mg Oral Daily  . lidocaine  1 patch Transdermal Q24H  . loratadine  10 mg Oral Daily  . melatonin  5 mg Oral QHS  . mirtazapine  15 mg Oral QHS  . oxybutynin  15 mg Oral Daily  . pravastatin  40 mg Oral QHS  . senna-docusate  1 tablet Oral Daily  . sertraline  50 mg Oral QHS  . sodium chloride flush  3 mL Intravenous Q12H  . sodium zirconium cyclosilicate  10 g Oral Daily  . torsemide  20 mg Oral Daily   Continuous Infusions: . sodium chloride     PRN Meds: sodium chloride, acetaminophen, cyclobenzaprine, HYDROcodone-acetaminophen, lidocaine-prilocaine, nitroGLYCERIN, ondansetron (ZOFRAN) IV, sodium chloride flush   Vital Signs    Vitals:   12/14/20 0515 12/14/20 0600 12/14/20 0800 12/14/20 0822  BP: (!) 145/57 (!) 151/54 (!) 146/60 140/60  Pulse: 74 72 73 71  Resp: 19 (!) 21 (!) 21 (!) 21  Temp:      TempSrc:      SpO2: 100% 99% 100% 100%  Weight:        Intake/Output Summary (Last 24 hours) at 12/14/2020 1047 Last data filed at 12/13/2020 1730 Gross per 24 hour  Intake --  Output 2000 ml  Net -2000 ml   Filed Weights   12/13/20 0833  Weight: 81.6 kg    Physical Exam   GEN: Well nourished, well developed, in no acute distress.  HEENT: Grossly normal.  Neck: Supple, no JVD, + bilat carotid bruits vs murmurs, no masses. Cardiac: RRR, 3/6 SEM loudest @ the upper sternal borders, heard throughout.  No rubs, or gallops. No clubbing, cyanosis, edema.  Radials 2+, DP/PT 1+ and equal bilaterally. LUE AVF w/ +  bruit/thrill. Respiratory:  Respirations regular and unlabored, bibasilar crackles. GI: Soft, nontender, nondistended, BS + x 4. MS: no deformity or atrophy. Skin: warm and dry, no rash. Neuro:  Strength and sensation are intact. Psych: AAOx3.  Normal affect.  Labs    Chemistry Recent Labs  Lab 12/13/20 0829 12/14/20 0518  NA 133* 137  K >7.5* 4.5  CL 90* 91*  CO2 22 28  GLUCOSE 116* 87  BUN 75* 47*  CREATININE 9.13* 6.30*  CALCIUM 8.1* 8.2*  GFRNONAA 4* 6*  ANIONGAP 21* 18*     Hematology Recent Labs  Lab 12/13/20 0829  WBC 17.0*  RBC 4.22  HGB 11.8*  HCT 38.3  MCV 90.8  MCH 28.0  MCHC 30.8  RDW 14.3  PLT 331    Cardiac Enzymes  Recent Labs  Lab 12/13/20 0829 12/13/20 1204  TROPONINIHS 58* 78*      BNP Recent Labs  Lab 12/13/20 0829  BNP 2,514.0*    Lipids  Lab Results  Component Value Date   CHOL 180 01/17/2018   HDL 48 01/17/2018   LDLCALC 101 (H) 01/17/2018   TRIG 154 (H) 01/17/2018   CHOLHDL 3.8 01/17/2018    HbA1c  Lab Results  Component Value  Date   HGBA1C 5.1 03/30/2020    Radiology    DG Chest Portable 1 View  Result Date: 12/13/2020 CLINICAL DATA:  Respiratory distress, shortness of breath at dialysis missed dialysis Saturday and today EXAM: PORTABLE CHEST 1 VIEW COMPARISON:  Chest radiograph August 30, 2020 FINDINGS: The heart size and mediastinal contours are enlarged. Diffuse interstitial opacity with bibasilar predominant alveolar opacities no significant pleural effusion or pneumothorax. The visualized skeletal structures are unremarkable. IMPRESSION: Diffuse interstitial opacity with bibasilar predominant alveolar opacities, which likely reflects pulmonary edema. Electronically Signed   By: Dahlia Bailiff MD   On: 12/13/2020 09:05   Telemetry    RSR, no recurrent arrhythmias - Personally Reviewed  Patient Profile     Phyllis [name] is a 75 y.o. female with a history of severe coronary artery disease involving  the left main and ostial LAD, HFpEF, moderate to severe aortic stenosis, end-stage renal disease on hemodialysis, diabetes, hypertension, hyperlipidemia, COPD, history of COVID-19 infection (November 2020), sleep apnea, anemia of chronic disease, and nonobstructive carotid arterial disease, who presented 1/24 w/ dyspnea, volume overload, and hyperkalemia (K>7.5) after missing HD on 1/22 due to weather.  While in the ED, she had two brief episodes of WCT.  Assessment & Plan    1.  Nonsustained Wide Complex Tachycardia: Two brief episodes noted on the morning of 1/24 in the setting of volume overload and severe hyperkalemia. Both episodes were asymptomatic.  Axis of tachycardia was same as baseline rhythm, and differential may include NSVT, SVT, or sinus tachycardia w/ ischemic ST segment changes resulting in widening of the QRS.  She has not had any further episodes.  Cont  blocker therapy.  2.  Acute on chronic HFpEF/Acute hypoxic resp failure:  Missed HD 1/22 due to weather.  S/p HD on 1/24 and feeling much better.  Plan for d/c today.  Still w/ bibasilar crackles and she will be dialyzed as outpt this AM.  3.  CAD:  Known severe LM/LAD dzs on cath 09/2019.  Despite hypoxia, tachycardia, she has not had c/p.  HsTrop only mildly elevated @ 58  78.  No PCI/surgical options for CAD in the past.  Cont conservative med rx  asa, statin, ? blocker, nitrate.  4.  Essential HTN:  Stable.  5.  HL:  Cont statin.  6.  Mod - Sev Ao Stenosis:  Stable by echo yesterday.  Conservative rx as she is not a TAVR/SAVR candidate.  7.  Hyperkalemia:  Stable s/p HD yesterday.  8.  ESRD:  HD per nephrology.  Signed, Murray Hodgkins, NP  12/14/2020, 10:47 AM    For questions or updates, please contact   Please consult [email or web address] for contact info under Cardiology/STEMI.

## 2020-12-15 LAB — HEPATITIS B DNA, ULTRAQUANTITATIVE, PCR
HBV DNA SERPL PCR-ACNC: NOT DETECTED [IU]/mL
HBV DNA SERPL PCR-LOG IU: UNDETERMINED {Log_IU}/mL

## 2020-12-19 ENCOUNTER — Other Ambulatory Visit: Payer: Self-pay

## 2020-12-19 ENCOUNTER — Encounter: Payer: Self-pay | Admitting: Emergency Medicine

## 2020-12-19 ENCOUNTER — Emergency Department: Payer: Medicare Other

## 2020-12-19 ENCOUNTER — Observation Stay
Admission: EM | Admit: 2020-12-19 | Discharge: 2020-12-20 | Disposition: A | Discharge status: Home / Self Care | Payer: Medicare Other | Attending: Internal Medicine | Admitting: Internal Medicine

## 2020-12-19 DIAGNOSIS — Z9989 Dependence on other enabling machines and devices: Secondary | ICD-10-CM

## 2020-12-19 DIAGNOSIS — Z992 Dependence on renal dialysis: Secondary | ICD-10-CM | POA: Diagnosis not present

## 2020-12-19 DIAGNOSIS — J1282 Pneumonia due to coronavirus disease 2019: Secondary | ICD-10-CM | POA: Insufficient documentation

## 2020-12-19 DIAGNOSIS — I132 Hypertensive heart and chronic kidney disease with heart failure and with stage 5 chronic kidney disease, or end stage renal disease: Secondary | ICD-10-CM | POA: Insufficient documentation

## 2020-12-19 DIAGNOSIS — R778 Other specified abnormalities of plasma proteins: Secondary | ICD-10-CM

## 2020-12-19 DIAGNOSIS — J45909 Unspecified asthma, uncomplicated: Secondary | ICD-10-CM | POA: Insufficient documentation

## 2020-12-19 DIAGNOSIS — R7989 Other specified abnormal findings of blood chemistry: Secondary | ICD-10-CM

## 2020-12-19 DIAGNOSIS — I2510 Atherosclerotic heart disease of native coronary artery without angina pectoris: Secondary | ICD-10-CM | POA: Diagnosis not present

## 2020-12-19 DIAGNOSIS — I5032 Chronic diastolic (congestive) heart failure: Secondary | ICD-10-CM | POA: Diagnosis not present

## 2020-12-19 DIAGNOSIS — G4733 Obstructive sleep apnea (adult) (pediatric): Secondary | ICD-10-CM

## 2020-12-19 DIAGNOSIS — I469 Cardiac arrest, cause unspecified: Secondary | ICD-10-CM | POA: Diagnosis not present

## 2020-12-19 DIAGNOSIS — I10 Essential (primary) hypertension: Secondary | ICD-10-CM | POA: Diagnosis not present

## 2020-12-19 DIAGNOSIS — Z7982 Long term (current) use of aspirin: Secondary | ICD-10-CM | POA: Diagnosis not present

## 2020-12-19 DIAGNOSIS — E65 Localized adiposity: Secondary | ICD-10-CM

## 2020-12-19 DIAGNOSIS — R748 Abnormal levels of other serum enzymes: Secondary | ICD-10-CM | POA: Diagnosis not present

## 2020-12-19 DIAGNOSIS — Z79899 Other long term (current) drug therapy: Secondary | ICD-10-CM | POA: Diagnosis not present

## 2020-12-19 DIAGNOSIS — U071 COVID-19: Principal | ICD-10-CM

## 2020-12-19 DIAGNOSIS — R0602 Shortness of breath: Secondary | ICD-10-CM | POA: Diagnosis present

## 2020-12-19 DIAGNOSIS — E119 Type 2 diabetes mellitus without complications: Secondary | ICD-10-CM | POA: Insufficient documentation

## 2020-12-19 DIAGNOSIS — J449 Chronic obstructive pulmonary disease, unspecified: Secondary | ICD-10-CM | POA: Diagnosis not present

## 2020-12-19 DIAGNOSIS — N186 End stage renal disease: Secondary | ICD-10-CM | POA: Diagnosis not present

## 2020-12-19 LAB — COMPREHENSIVE METABOLIC PANEL
ALT: 17 U/L (ref 0–44)
AST: 27 U/L (ref 15–41)
Albumin: 3.6 g/dL (ref 3.5–5.0)
Alkaline Phosphatase: 80 U/L (ref 38–126)
Anion gap: 17 — ABNORMAL HIGH (ref 5–15)
BUN: 35 mg/dL — ABNORMAL HIGH (ref 8–23)
CO2: 32 mmol/L (ref 22–32)
Calcium: 7.4 mg/dL — ABNORMAL LOW (ref 8.9–10.3)
Chloride: 89 mmol/L — ABNORMAL LOW (ref 98–111)
Creatinine, Ser: 5.51 mg/dL — ABNORMAL HIGH (ref 0.44–1.00)
GFR, Estimated: 8 mL/min — ABNORMAL LOW (ref >60)
Glucose, Bld: 120 mg/dL — ABNORMAL HIGH (ref 70–99)
Potassium: 3.8 mmol/L (ref 3.5–5.1)
Sodium: 138 mmol/L (ref 135–145)
Total Bilirubin: 1.4 mg/dL — ABNORMAL HIGH (ref 0.3–1.2)
Total Protein: 7 g/dL (ref 6.5–8.1)

## 2020-12-19 LAB — CBC WITH DIFFERENTIAL/PLATELET
Abs Immature Granulocytes: 0.03 10*3/uL (ref 0.00–0.07)
Basophils Absolute: 0 10*3/uL (ref 0.0–0.1)
Basophils Relative: 0 %
Eosinophils Absolute: 0.7 10*3/uL — ABNORMAL HIGH (ref 0.0–0.5)
Eosinophils Relative: 9 %
HCT: 37.6 % (ref 36.0–46.0)
Hemoglobin: 12 g/dL (ref 12.0–15.0)
Immature Granulocytes: 0 %
Lymphocytes Relative: 9 %
Lymphs Abs: 0.7 10*3/uL (ref 0.7–4.0)
MCH: 28.6 pg (ref 26.0–34.0)
MCHC: 31.9 g/dL (ref 30.0–36.0)
MCV: 89.7 fL (ref 80.0–100.0)
Monocytes Absolute: 0.6 10*3/uL (ref 0.1–1.0)
Monocytes Relative: 8 %
Neutro Abs: 5.3 10*3/uL (ref 1.7–7.7)
Neutrophils Relative %: 74 %
Platelets: 141 10*3/uL — ABNORMAL LOW (ref 150–400)
RBC: 4.19 MIL/uL (ref 3.87–5.11)
RDW: 14.6 % (ref 11.5–15.5)
WBC: 7.3 10*3/uL (ref 4.0–10.5)
nRBC: 0 % (ref 0.0–0.2)

## 2020-12-19 LAB — TROPONIN I (HIGH SENSITIVITY)
Troponin I (High Sensitivity): 113 ng/L (ref <18)
Troponin I (High Sensitivity): 141 ng/L (ref <18)
Troponin I (High Sensitivity): 154 ng/L (ref <18)

## 2020-12-19 LAB — PROCALCITONIN: Procalcitonin: 5.23 ng/mL

## 2020-12-19 LAB — TSH: TSH: 1.163 u[IU]/mL (ref 0.350–4.500)

## 2020-12-19 MED ORDER — LOSARTAN POTASSIUM 50 MG PO TABS: 50.0000 mg | ORAL_TABLET | Freq: Every day | ORAL | Status: DC

## 2020-12-19 MED ORDER — ISOSORBIDE MONONITRATE ER 30 MG PO TB24
60.0000 mg | ORAL_TABLET | Freq: Every day | ORAL | Status: DC
  Administered 2020-12-20: 60 mg via ORAL
  Filled 2020-12-19: qty 1

## 2020-12-19 MED ORDER — CALCIUM ACETATE (PHOS BINDER) 667 MG PO CAPS
1334.0000 mg | ORAL_CAPSULE | Freq: Three times a day (TID) | ORAL | Status: DC
Start: 2020-12-20 — End: 2020-12-20
  Administered 2020-12-20: 1334 mg via ORAL
  Filled 2020-12-19 (×5): qty 2

## 2020-12-19 MED ORDER — CARVEDILOL 3.125 MG PO TABS
3.1250 mg | ORAL_TABLET | Freq: Two times a day (BID) | ORAL | Status: DC
  Administered 2020-12-20: 3.125 mg via ORAL
  Filled 2020-12-19: qty 1

## 2020-12-19 MED ORDER — AMLODIPINE BESYLATE 5 MG PO TABS
5.0000 mg | ORAL_TABLET | Freq: Every day | ORAL | Status: DC
  Administered 2020-12-20: 5 mg via ORAL
  Filled 2020-12-19: qty 1

## 2020-12-19 MED ORDER — DEXAMETHASONE SODIUM PHOSPHATE 10 MG/ML IJ SOLN
10.0000 mg | Freq: Once | INTRAMUSCULAR | Status: AC
  Administered 2020-12-19: 10 mg via INTRAVENOUS
  Filled 2020-12-19: qty 1

## 2020-12-19 MED ORDER — HYDROCODONE-ACETAMINOPHEN 7.5-325 MG PO TABS: 1.0000 | ORAL_TABLET | Freq: Every day | ORAL | Status: DC | PRN

## 2020-12-19 MED ORDER — MELATONIN 5 MG PO TABS
5.0000 mg | ORAL_TABLET | Freq: Every day | ORAL | Status: DC
  Administered 2020-12-20: 5 mg via ORAL
  Filled 2020-12-19 (×2): qty 1

## 2020-12-19 MED ORDER — ACETAMINOPHEN 325 MG PO TABS: 325.0000 mg | ORAL_TABLET | Freq: Four times a day (QID) | ORAL | Status: DC | PRN

## 2020-12-19 MED ORDER — ASPIRIN EC 81 MG PO TBEC
81.0000 mg | DELAYED_RELEASE_TABLET | Freq: Every day | ORAL | Status: DC
  Administered 2020-12-20: 81 mg via ORAL
  Filled 2020-12-19: qty 1

## 2020-12-19 MED ORDER — PRAVASTATIN SODIUM 20 MG PO TABS
40.0000 mg | ORAL_TABLET | Freq: Every day | ORAL | Status: DC
  Administered 2020-12-20: 40 mg via ORAL
  Filled 2020-12-19 (×2): qty 1

## 2020-12-19 MED ORDER — ACETAMINOPHEN 650 MG RE SUPP: 325.0000 mg | Freq: Four times a day (QID) | RECTAL | Status: DC | PRN

## 2020-12-19 MED ORDER — ONDANSETRON HCL 4 MG PO TABS: 4.0000 mg | ORAL_TABLET | Freq: Four times a day (QID) | ORAL | Status: DC | PRN

## 2020-12-19 MED ORDER — ONDANSETRON HCL 4 MG/2ML IJ SOLN
4.0000 mg | Freq: Four times a day (QID) | INTRAMUSCULAR | Status: DC | PRN
  Administered 2020-12-20: 4 mg via INTRAVENOUS
  Filled 2020-12-19: qty 2

## 2020-12-19 MED ORDER — ALBUTEROL SULFATE HFA 108 (90 BASE) MCG/ACT IN AERS
2.0000 | INHALATION_SPRAY | RESPIRATORY_TRACT | Status: DC
  Administered 2020-12-19: 2 via RESPIRATORY_TRACT
  Filled 2020-12-19: qty 6.7

## 2020-12-19 MED ORDER — ASPIRIN 81 MG PO CHEW
324.0000 mg | CHEWABLE_TABLET | Freq: Once | ORAL | Status: AC
  Administered 2020-12-19: 324 mg via ORAL
  Filled 2020-12-19: qty 4

## 2020-12-19 MED ORDER — SODIUM CHLORIDE 0.9 % IV SOLN
200.0000 mg | Freq: Once | INTRAVENOUS | Status: AC
  Administered 2020-12-20: 200 mg via INTRAVENOUS
  Filled 2020-12-19: qty 40

## 2020-12-19 MED ORDER — HEPARIN SODIUM (PORCINE) 5000 UNIT/ML IJ SOLN
5000.0000 [IU] | Freq: Three times a day (TID) | INTRAMUSCULAR | Status: DC
  Administered 2020-12-19 – 2020-12-20 (×2): 5000 [IU] via SUBCUTANEOUS
  Filled 2020-12-19 (×3): qty 1

## 2020-12-19 MED ORDER — OXYBUTYNIN CHLORIDE ER 5 MG PO TB24
15.0000 mg | ORAL_TABLET | Freq: Every day | ORAL | Status: DC
  Administered 2020-12-20: 15 mg via ORAL
  Filled 2020-12-19: qty 1

## 2020-12-19 MED ORDER — NITROGLYCERIN 0.4 MG SL SUBL: 0.4000 mg | SUBLINGUAL_TABLET | SUBLINGUAL | Status: DC | PRN

## 2020-12-19 MED ORDER — BUSPIRONE HCL 5 MG PO TABS
5.0000 mg | ORAL_TABLET | Freq: Two times a day (BID) | ORAL | Status: DC
  Administered 2020-12-20 (×2): 5 mg via ORAL
  Filled 2020-12-19 (×3): qty 1

## 2020-12-19 MED ORDER — SERTRALINE HCL 50 MG PO TABS
50.0000 mg | ORAL_TABLET | Freq: Every day | ORAL | Status: DC
  Administered 2020-12-20: 50 mg via ORAL
  Filled 2020-12-19: qty 1

## 2020-12-19 MED ORDER — MIRTAZAPINE 15 MG PO TABS
15.0000 mg | ORAL_TABLET | Freq: Every day | ORAL | Status: DC
  Administered 2020-12-20: 15 mg via ORAL
  Filled 2020-12-19: qty 1

## 2020-12-19 MED ORDER — DEXAMETHASONE SODIUM PHOSPHATE 10 MG/ML IJ SOLN: 10.0000 mg | INTRAMUSCULAR | Status: DC

## 2020-12-19 MED ORDER — CYCLOBENZAPRINE HCL 10 MG PO TABS: 10.0000 mg | ORAL_TABLET | Freq: Every day | ORAL | Status: DC | PRN

## 2020-12-19 MED ORDER — SODIUM CHLORIDE 0.9 % IV SOLN: 100.0000 mg | Freq: Every day | INTRAVENOUS | Status: DC

## 2020-12-19 MED ORDER — ALBUTEROL SULFATE HFA 108 (90 BASE) MCG/ACT IN AERS
2.0000 | INHALATION_SPRAY | RESPIRATORY_TRACT | Status: DC | PRN
  Filled 2020-12-19: qty 6.7

## 2020-12-19 MED ORDER — POLYETHYLENE GLYCOL 3350 17 G PO PACK
17.0000 g | PACK | Freq: Two times a day (BID) | ORAL | Status: DC
  Administered 2020-12-19 – 2020-12-20 (×2): 17 g via ORAL
  Filled 2020-12-19 (×2): qty 1

## 2020-12-19 NOTE — ED Triage Notes (Signed)
Pt to ED via [facility] with c/o Covid+ 2 days ago, per EMS pt with increasing coughing and SOB since last night. Per EMS pt had dialysis yesterday and had complete treatment. Per EMS pt on chronic 2L, increased to 4L by FD, pt 100% on NRB.   102 oral, however sitting on heating pad CBG 133 100% NRB 163/67

## 2020-12-19 NOTE — ED Provider Notes (Signed)
[facility] Emergency Department Provider Note   ____________________________________________   I have reviewed the triage vital signs and the nursing notes.   HISTORY  Chief Complaint Covid Positive (/) and Shortness of Breath   History limited by: Not Limited   HPI Phyllis [name] is a 75 y.o. female who presents to the emergency department today because of concern for worsening shortness of breath. The patient states she was diagnosed with covid a couple of days ago. Has continued shortness of breath. Has had some cough. She has also had some left sided chest discomfort. The patient has history of CAD, COPD.    Records reviewed. Per medical record review patient has a history of recent positive covid test.  Past Medical History:  Diagnosis Date  . (HFpEF) heart failure with preserved ejection fraction ([place])    a. TTE 01/2014: nl LV sys fxn, no valvular abnormalities; b. TTE 11/16: nl EF, mild LVH;  c. 04/2019 Echo: EF 60-65%. DD; d. 08/2020 Echo: EF 55-60%, no rwma, mild LVH, gr2 DD, nl RV size/fxn, mod dil LA, mildly dil RA, mild MR. Mod to sev AS (AoV area 0.8cm^2)  . Allergy   . Anemia of chronic disease   . Anxiety   . Aortic atherosclerosis ([place])   . Asthma   . CAD (coronary artery disease)    a. 09/2019 Cath: LM 80ost/m, 99d, LAD 99ost, 70p/m, LCX 65ost/p, OM2 70, RCA nl-->poor PCI/surgical candidate-->med Rx.  . Chronic back pain   . COPD (chronic obstructive pulmonary disease) ([place])   . COVID-19 virus infection 09/2019  . Diabetes mellitus with complication ([place])   . ESRD on hemodialysis ([place])    a. Tues/Sat; b. 2/2 small kidneys  . Essential hypertension   . Fistula    lower left arm  . GERD (gastroesophageal reflux disease)   . Gout   . History of exercise stress test    a. 01/2014: no evidence of ischemia; b. Lexiscan 08/2015: no sig ischemia, severe GI uptake artifact, low risk; c. CPX @ [facility] 09/2016: exercised 3 min 12 sec on bike  without incline, 2.28 METs, VO2 of 8.1, 48% of predicted, indicating mod to sev functional impairment, evidence of blunted HR, stroke volume, and BP augmentation as well as ventilation-perfusion mismatch with exercise  . HLD (hyperlipidemia)   . Moderate to Severe aortic stenosis    a. 04/2019 Echo: Mod AS; c. 08/2020 Echo: Mod to sev AS (AoV area 0.8cm^2).  . Non-obstructive Carotid arterial disease ([place])    a. 12/2017: <50% bilat ICA dzs.  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 Sleep apnea     Patient Active Problem List   Diagnosis Date Noted  . NSVT (nonsustained ventricular tachycardia) ([place])   . Respiratory failure, acute-on-chronic ([place]) 12/13/2020  . Acute respiratory failure with hypoxia ([place]) 08/29/2020  . Acute on chronic respiratory failure with hypoxia ([place]) 08/28/2020  . Depression with anxiety 08/28/2020  . Type II diabetes mellitus with renal manifestations ([place]) 08/28/2020  . Acute on chronic diastolic (congestive) heart failure ([place]) 04/30/2020  . COPD with chronic bronchitis ([place]) 04/30/2020  . Dyspnea 04/20/2020  . Asymptomatic bacteriuria 12/05/2019  . COPD with acute exacerbation ([place]) 10/23/2019  . Elevated troponin 10/23/2019  . CAD (coronary artery disease) 10/23/2019  . COPD exacerbation ([place]) 10/23/2019  . Leukocytosis 10/23/2019  . Anxiety 10/23/2019  . Palliative care by specialist   . Goals of care, counseling/discussion   . Flash  pulmonary edema ([place]) 10/14/2019  . Respiratory failure with hypoxia ([place]) 10/11/2019  . 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-stage renal disease) ([place])   . Aortic valve stenosis   . Acute respiratory failure with hypoxemia ([place]) 09/20/2019  . COPD with acute bronchitis ([place]) 09/05/2019  . Congestive heart failure ([place]) 05/24/2019  . Hyperkalemia 11/29/2018  . Chronic diastolic heart failure ([place]) 11/02/2018  . HTN (hypertension) 11/02/2018  . Uncontrolled hypertension 10/21/2018  . History of acute pulmonary  edema 10/21/2018  . Pressure injury of skin 03/16/2018  . CVA (cerebral vascular accident) ([place]) 01/16/2018  . Aortic atherosclerosis ([place]) 12/11/2017  . Chest pain 09/18/2017  . Hyperlipidemia 08/06/2015  . Complication from renal dialysis device 04/12/2015  . SOB (shortness of breath) 02/01/2015  . ESRD (end stage renal disease) ([place]) 02/01/2015  . Type 2 diabetes mellitus with other specified complication ([place]) [id number]  . Asthma 02/01/2015    Past Surgical History:  Procedure Laterality Date  . CARDIAC CATHETERIZATION    . carpel tunnel    . GALLBLADDER SURGERY    . PERIPHERAL VASCULAR CATHETERIZATION N/A 04/12/2015   Procedure: A/V Shuntogram/Fistulagram;  Surgeon: Algernon Huxley, MD;  Location: [facility];  Service: Cardiovascular;  Laterality: N/A;  . PERIPHERAL VASCULAR CATHETERIZATION N/A 04/12/2015   Procedure: A/V Shunt Intervention;  Surgeon: Algernon Huxley, MD;  Location: [facility];  Service: Cardiovascular;  Laterality: N/A;  . PERIPHERAL VASCULAR CATHETERIZATION N/A 06/09/2015   Procedure: Dialysis/Perma Catheter Removal;  Surgeon: Katha Cabal, MD;  Location: [facility];  Service: Cardiovascular;  Laterality: N/A;  . RIGHT/LEFT HEART CATH AND CORONARY ANGIOGRAPHY N/A 10/14/2019   Procedure: RIGHT/LEFT HEART CATH AND CORONARY ANGIOGRAPHY;  Surgeon: Belva Crome, MD;  Location: [facility];  Service: Cardiovascular;  Laterality: N/A;    Prior to Admission medications   Medication Sig Start Date End Date Taking? Authorizing Provider  acetaminophen (TYLENOL) 500 MG tablet Take 500-1,000 mg by mouth every 6 (six) hours as needed for mild pain, moderate pain or fever.    [provider]  albuterol (PROVENTIL HFA;VENTOLIN HFA) 108 (90 Base) MCG/ACT inhaler Inhale 2 puffs into the lungs every 6 (six) hours as needed for wheezing or shortness of breath. Out of medication    [provider]  aspirin 81 MG EC tablet Take 81  mg by mouth daily. 01/27/20   [provider]  busPIRone (BUSPAR) 5 MG tablet Take 5 mg by mouth 2 (two) times daily. 09/08/20   [provider]  calcium acetate (PHOSLO) 667 MG capsule Take 1,334 mg by mouth 3 (three) times daily with meals. 10/31/20   [provider]  carvedilol (COREG) 3.125 MG tablet Take 1 tablet (3.125 mg total) by mouth 2 (two) times daily with a meal. 04/30/20   Dhungel, Nishant, MD  cetirizine (ZYRTEC) 10 MG tablet Take 10 mg by mouth daily. 09/29/20   [provider]  cyclobenzaprine (FLEXERIL) 10 MG tablet Take 10 mg by mouth daily as needed for muscle spasms.    [provider]  epoetin alfa (EPOGEN) 10000 UNIT/ML injection Inject 0.4 mLs (4,000 Units total) into the vein Every Tuesday,Thursday,and Saturday with dialysis. 04/03/20   Nolberto Hanlon, MD  HYDROcodone-acetaminophen (NORCO) 7.5-325 MG tablet Take 1-2 tablets by mouth daily as needed for pain. 12/09/20   [provider]  ipratropium-albuterol (DUONEB) 0.5-2.5 (3) MG/3ML SOLN Take 3 mLs by nebulization every 6 (six) hours as needed. Patient taking differently: Take 3 mLs  by nebulization every 6 (six) hours as needed (shortness of breath or wheezing). 10/17/19   [facility], Tami Lin, PA  isosorbide mononitrate (IMDUR) 60 MG 24 hr tablet Take 1 tablet (60 mg total) by mouth daily. 09/04/20   Wouk, Ailene Rud, MD  lidocaine (LIDODERM) 5 % Place 1 patch onto the skin daily. Remove & Discard patch within 12 hours or as directed by MD 10/14/19   Flora Lipps, MD  lidocaine-prilocaine (EMLA) cream Apply 1 application topically as needed (prior to treatment).     [provider]  losartan (COZAAR) 50 MG tablet Take 50 mg by mouth daily. 10/10/20   [provider]  Melatonin 5 MG CHEW Chew 1 tablet by mouth at bedtime.    [provider]  mirtazapine (REMERON) 15 MG tablet Take 15 mg by mouth at bedtime. 02/25/20   [provider]   nitroGLYCERIN (NITROSTAT) 0.4 MG SL tablet Place 1 tablet (0.4 mg total) under the tongue every 5 (five) minutes as needed for chest pain. 09/02/20   Minna Merritts, MD  oxybutynin (DITROPAN XL) 15 MG 24 hr tablet Take 15 mg by mouth daily. 12/24/17   [provider]  pravastatin (PRAVACHOL) 40 MG tablet Take 40 mg by mouth at bedtime.     [provider]  senna-docusate (SENOKOT-S) 8.6-50 MG tablet Take 1 tablet by mouth daily.    [provider]  sertraline (ZOLOFT) 50 MG tablet Take 50 mg by mouth at bedtime. 03/02/20   [provider]  torsemide (DEMADEX) 20 MG tablet Take 40 mg by mouth daily. 09/16/20   [provider]    Allergies Enalapril maleate; Nitrofurantoin; Sulfamethoxazole-trimethoprim; 2,4-d dimethylamine (amisol); Baclofen; Bactrim [sulfamethoxazole-trimethoprim]; Macrodantin [nitrofurantoin macrocrystal]; Neosporin [neomycin-bacitracin zn-polymyx]; Quinine; Quinine derivatives; Ultram [tramadol]; Zocor [simvastatin]; and Levodopa  Family History  Problem Relation Age of Onset  . Hypertension Mother   . Hyperlipidemia Mother   . Heart disease Father   . Heart attack Father 62  . Hypertension Father   . Hyperlipidemia Father   . Heart disease Brother        CABG   . Heart attack Brother   . Breast cancer Neg Hx     Social History Social History   Tobacco Use  . Smoking status: Never Smoker  . Smokeless tobacco: Never Used  Vaping Use  . Vaping Use: Never used  Substance Use Topics  . Alcohol use: No  . Drug use: No    Review of Systems Constitutional: No fever/chills Eyes: No visual changes. ENT: No sore throat. Cardiovascular: Positive for left sided chest pain. Respiratory: Positive for shortness of breath. Positive for cough. Gastrointestinal: No abdominal pain.  No nausea, no vomiting.  No diarrhea.   Genitourinary: Negative for dysuria. Musculoskeletal: Negative for back pain. Skin: Negative for  rash. Neurological: Negative for headaches, focal weakness or numbness.  ____________________________________________   PHYSICAL EXAM:  VITAL SIGNS: ED Triage Vitals  Enc Vitals Group     BP 11/30/2020 1716 (!) 167/66     Pulse Rate 12/02/2020 1716 96     Resp 11/22/2020 1716 (!) 26     Temp 12/08/2020 1719 (!) 101.1 F (38.4 C)     Temp Source 11/23/2020 1719 Oral     SpO2 11/26/2020 1716 100 %     Weight 12/09/2020 1717 180 lb (81.6 kg)     Height --      Head Circumference --      Peak Flow --  Pain Score 12/02/2020 1717 0   Constitutional: Alert and oriented.  Eyes: Conjunctivae are normal.  ENT      Head: Normocephalic and atraumatic.      Nose: No congestion/rhinnorhea.      Mouth/Throat: Mucous membranes are moist.      Neck: No stridor. Hematological/Lymphatic/Immunilogical: No cervical lymphadenopathy. Cardiovascular: Normal rate, regular rhythm.  No murmurs, rubs, or gallops.  Respiratory: Increased work of breathing.  Gastrointestinal: Soft and non tender. No rebound. No guarding.  Genitourinary: Deferred Musculoskeletal: Normal range of motion in all extremities. No lower extremity edema. Neurologic:  Normal speech and language. No gross focal neurologic deficits are appreciated.  Skin:  Skin is warm, dry and intact. No rash noted. Psychiatric: Mood and affect are normal. Speech and behavior are normal. Patient exhibits appropriate insight and judgment.  ____________________________________________    LABS (pertinent positives/negatives)  Trop hs 113 CBC wbc 7.3, hgb 12.0, plt 141 CMP na 138, k 3.8, glu 120, cr 5.51  ____________________________________________   EKG  I, Nance Pear, attending physician, personally viewed and interpreted this EKG  EKG Time: 1717 Rate: 93 Rhythm: sinus rhythm Axis: left axis deviation Intervals: qtc 472 QRS: narrow ST changes: no st elevation Impression: abnormal ekg  ____________________________________________     RADIOLOGY  CXR No acute abnormality, chronic interstitial disease  ____________________________________________   PROCEDURES  Procedures  ____________________________________________   INITIAL IMPRESSION / ASSESSMENT AND PLAN / ED COURSE  Pertinent labs & imaging results that were available during my care of the patient were reviewed by me and considered in my medical decision making (see chart for details).   Patient presented to the emergency department today because of concerns for worsening shortness of breath in setting of recent Covid.  This has been accompanied by some chest discomfort.  Initial troponin was elevated at 113 with repeat elevation even more elevated.  This point I am not positive that this represents true ACS.  She is a dialysis patient and certainly could be having some stress related to the Covid diagnosis.  However will plan on admission to the hospitalist service.  ____________________________________________   FINAL CLINICAL IMPRESSION(S) / ED DIAGNOSES  Final diagnoses:  [id number]  Elevated troponin     Note: This dictation was prepared with Dragon dictation. Any transcriptional errors that result from this process are unintentional     Nance Pear, MD 11/20/2020 2151

## 2020-12-19 NOTE — ED Notes (Signed)
Date and time results received: 11/23/2020 1816 (use smartphrase ".now" to insert current time)  Test: Trop Critical Value: 113  Name of Provider Notified: Ellender Hose, MD  Orders Received? Or Actions Taken?: Critical Results Acknowleded

## 2020-12-19 NOTE — Progress Notes (Signed)
Remdesivir - Pharmacy Brief Note   O:  CXR: "No acute abnormality.  Mild chronic interstitial lung disease." SpO2: 94-100% on 2L/min via [place]   A/P:  Remdesivir 200 mg IVPB once followed by 100 mg IVPB daily x 4 days.   Renda Rolls, PharmD, [facility] 12/16/2020 10:31 PM

## 2020-12-19 NOTE — ED Notes (Signed)
Pt washed up and placed in a gown. Purewick in place.

## 2020-12-19 NOTE — H&P (Addendum)
History and Physical   Phyllis [name] [id number] DOB: 03/08/1946 DOA: 12/07/2020  PCP: [name], MD  Outpatient Specialists: Dr. Percell [place], [facility] Patient coming from: Home via EMS  I have personally briefly reviewed patient's old medical records in [place].  Chief Concern: Shortness of breath  HPI: Phyllis [name] is a 75 y.o. female with medical history significant for end-stage renal disease on hemodialysis via the left upper extremity placed in 01/27/2014, history of basal cell carcinoma of the forearm x3, chronic pain, asthma without status asthmaticus, on chronic 2 L nasal cannula, chronic back pain, GERD, gout, non-insulin-dependent diabetes mellitus type 2, hypertension, peripheral neuropathy, obstructive sleep apnea, secondary hyper per parathyroid, history of redundant colon, internal hemorrhoids, cholecystectomy, chronic interstitial lung disease, depression/anxiety presented to the emergency department for chief concerns of worsening shortness of breath.  She endorses dry cough and fever started on 12/15/20. She endorses left lower abdominal with coughing. She took mucinex which improved her cough. She reports that she tested positive for Covid on 12/17/2020.  Social history: She lives at home with caregivers. She does not have any children and has never been married. She has a nephew that lives out of state.  Patient denies ever using tobacco, EtOH, recreational drug use.  ROS: Constitutional: no weight change,+ fever ENT/Mouth: + sore throat, no rhinorrhea Eyes: no eye pain, no vision changes Cardiovascular: no chest pain, + dyspnea,  no edema, no palpitations Respiratory: + cough, + sputum, no wheezing Gastrointestinal: no nausea, + vomiting, no diarrhea, no constipation Genitourinary: no urinary incontinence, no dysuria, no hematuria Musculoskeletal: no arthralgias, + myalgias Skin: no skin lesions, no pruritus, Neuro: + weakness, no  loss of consciousness, no syncope Psych: no anxiety, + depression, + decrease appetite Heme/Lymph: no bruising, no bleeding  ED Course: Discussed with ED provider, patient required hospitalization due to positive troponin in setting of recent COVID-19 infection and persistent shortness of breath.  Assessment/Plan  Active Problems:   Shortness of breath   Shortness of breath-currently on baseline nasal cannula Myalgia and fever-secondary to COVID-19 infection -Symptomatic management -Remdesivir per pharmacy as patient recently tested positive on 12/17/2020 -Decadron 10 mg IV daily -O2 supplementation to maintain SPO2 greater than 88% -Incentive spirometry and flutter valve for 10 reps every 2 hours while awake -Albuterol 2 puffs every 2 hours as needed for shortness of breath and wheezing is a home medication which has been resumed -Checking pro-Cal if elevated will consider starting antibiotics for bacterial pneumonia -Respiratory 20 panel ordered -UA complete ordered  Elevated troponin: 113 increased to 141 -Query demand ischemia as I do not suspect ACS at this time however given patient is end-stage renal disease in troponin is elevated from previous presentation, we will continue to trend -We'll check for 2 more troponin and if continue to increase would recommend initiating heparin drip complete echo done on 12/13/2020 was read as EF 50 to 55%, left ventricular diastolic parameters are indeterminate.  Hypertension-resumed Coreg 3.125 mg p.o. twice daily, isosorbide mononitrate 60 mg daily p.o., losartan 50 mg daily, amlodipine 5 mg daily  Chronic diastolic heart failure-Coreg 3.125 p.o. twice daily  Hyperlipidemia-40 mg daily  End-stage renal disease on hemodialysis-patient did not miss any dialysis session -A.m. team to consult nephrology for routine hemodialysis  Depression/anxiety Chronic pain Query fibromyalgia -Resumed BuSpar 5 mg twice daily, mirtazapine 15 mg nightly,  sertraline 50 mg daily at bedtime  Chronic left shoulder pain -Status post triamcinolone acetone injection 40 mg intra-articular once  on 12/08/2020  Blindness in the left eye-present on admission  Obstructive sleep apnea-CPAP nightly resumed  Chart reviewed.   Hospitalization: 12/13/2020 - 12/14/2020 due to missed dialysis session and presented via EMS for hypoxia and was placed on nonrebreather when she desatted to 71% on room air.  She was treated for COPD exacerbation and possible CHF exacerbation.  Cardiology nephrology were consulted.  Patient had improved and received 1 hemodialysis session and cardiology cleared the patient for discharge.  DVT prophylaxis: Heparin subcutaneous Code Status: Full code Diet: Renal diet Family Communication: No Disposition Plan: Pending clinical course Consults called: None at this time Admission status: Observation with telemetry  Past Medical History:  Diagnosis Date  . (HFpEF) heart failure with preserved ejection fraction ([place])    a. TTE 01/2014: nl LV sys fxn, no valvular abnormalities; b. TTE 11/16: nl EF, mild LVH;  c. 04/2019 Echo: EF 60-65%. DD; d. 08/2020 Echo: EF 55-60%, no rwma, mild LVH, gr2 DD, nl RV size/fxn, mod dil LA, mildly dil RA, mild MR. Mod to sev AS (AoV area 0.8cm^2)  . Allergy   . Anemia of chronic disease   . Anxiety   . Aortic atherosclerosis ([place])   . Asthma   . CAD (coronary artery disease)    a. 09/2019 Cath: LM 80ost/m, 99d, LAD 99ost, 70p/m, LCX 65ost/p, OM2 70, RCA nl-->poor PCI/surgical candidate-->med Rx.  . Chronic back pain   . COPD (chronic obstructive pulmonary disease) ([place])   . COVID-19 virus infection 09/2019  . Diabetes mellitus with complication ([place])   . ESRD on hemodialysis ([place])    a. Tues/Sat; b. 2/2 small kidneys  . Essential hypertension   . Fistula    lower left arm  . GERD (gastroesophageal reflux disease)   . Gout   . History of exercise stress test    a. 01/2014: no evidence of ischemia;  b. Lexiscan 08/2015: no sig ischemia, severe GI uptake artifact, low risk; c. CPX @ [facility] 09/2016: exercised 3 min 12 sec on bike without incline, 2.28 METs, VO2 of 8.1, 48% of predicted, indicating mod to sev functional impairment, evidence of blunted HR, stroke volume, and BP augmentation as well as ventilation-perfusion mismatch with exercise  . HLD (hyperlipidemia)   . Moderate to Severe aortic stenosis    a. 04/2019 Echo: Mod AS; c. 08/2020 Echo: Mod to sev AS (AoV area 0.8cm^2).  . Non-obstructive Carotid arterial disease ([place])    a. 12/2017: <50% bilat ICA dzs.  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 Sleep apnea    Past Surgical History:  Procedure Laterality Date  . CARDIAC CATHETERIZATION    . carpel tunnel    . GALLBLADDER SURGERY    . PERIPHERAL VASCULAR CATHETERIZATION N/A 04/12/2015   Procedure: A/V Shuntogram/Fistulagram;  Surgeon: Algernon Huxley, MD;  Location: [place] [facility];  Service: Cardiovascular;  Laterality: N/A;  . PERIPHERAL VASCULAR CATHETERIZATION N/A 04/12/2015   Procedure: A/V Shunt Intervention;  Surgeon: Algernon Huxley, MD;  Location: [place] [facility];  Service: Cardiovascular;  Laterality: N/A;  . PERIPHERAL VASCULAR CATHETERIZATION N/A 06/09/2015   Procedure: Dialysis/Perma Catheter Removal;  Surgeon: Katha Cabal, MD;  Location: [place] [facility];  Service: Cardiovascular;  Laterality: N/A;  . RIGHT/LEFT HEART CATH AND CORONARY ANGIOGRAPHY N/A 10/14/2019   Procedure: RIGHT/LEFT HEART CATH AND CORONARY ANGIOGRAPHY;  Surgeon: Belva Crome, MD;  Location: [place] [facility];  Service: Cardiovascular;  Laterality: N/A;  Social History:  reports that she has never smoked. She has never used smokeless tobacco. She reports that she does not drink alcohol and does not use drugs.  Allergies  Allergen Reactions  . Enalapril Maleate Other (See Comments)    Other reaction(s): Headache  . Nitrofurantoin Swelling and Rash    Other  Reaction: swelling of body  . Sulfamethoxazole-Trimethoprim Swelling  . 2,4-D Dimethylamine (Amisol) Rash and Other (See Comments)    Other Reaction: h/a  . Baclofen Other (See Comments) and Nausea Only    lightheadness ,drowsiness , muscle weakness , twitching in hands   . Bactrim [Sulfamethoxazole-Trimethoprim] Swelling  . Macrodantin [Nitrofurantoin Macrocrystal] Swelling  . Neosporin [Neomycin-Bacitracin Zn-Polymyx] Other (See Comments) and Rash    Other Reaction: irritation Skin irritation   . Quinine Nausea And Vomiting, Rash and Other (See Comments)    Other Reaction: Vomiting, rash, h/a, vision  . Quinine Derivatives Nausea And Vomiting and Other (See Comments)    Vertigo, Headache   . Ultram [Tramadol] Palpitations  . Zocor [Simvastatin] Other (See Comments) and Rash    Other Reaction: muscle spasms Muscle pain and spasms  . Levodopa Other (See Comments)    Reaction: unknown   Family History  Problem Relation Age of Onset  . Hypertension Mother   . Hyperlipidemia Mother   . Heart disease Father   . Heart attack Father 43  . Hypertension Father   . Hyperlipidemia Father   . Heart disease Brother        CABG   . Heart attack Brother   . Breast cancer Neg Hx    Family history: Family history reviewed and not pertinent  Prior to Admission medications   Medication Sig Start Date End Date Taking? Authorizing Provider  acetaminophen (TYLENOL) 500 MG tablet Take 500-1,000 mg by mouth every 6 (six) hours as needed for mild pain, moderate pain or fever.    [provider]  albuterol (PROVENTIL HFA;VENTOLIN HFA) 108 (90 Base) MCG/ACT inhaler Inhale 2 puffs into the lungs every 6 (six) hours as needed for wheezing or shortness of breath. Out of medication    [provider]  aspirin 81 MG EC tablet Take 81 mg by mouth daily. 01/27/20   [provider]  busPIRone (BUSPAR) 5 MG tablet Take 5 mg by mouth 2 (two) times daily. 09/08/20   [provider]  calcium acetate (PHOSLO) 667 MG capsule Take 1,334 mg by mouth 3 (three) times daily with meals. 10/31/20   [provider]  carvedilol (COREG) 3.125 MG tablet Take 1 tablet (3.125 mg total) by mouth 2 (two) times daily with a meal. 04/30/20   Dhungel, Nishant, MD  cetirizine (ZYRTEC) 10 MG tablet Take 10 mg by mouth daily. 09/29/20   [provider]  cyclobenzaprine (FLEXERIL) 10 MG tablet Take 10 mg by mouth daily as needed for muscle spasms.    [provider]  epoetin alfa (EPOGEN) 10000 UNIT/ML injection Inject 0.4 mLs (4,000 Units total) into the vein Every Tuesday,Thursday,and Saturday with dialysis. 04/03/20   Nolberto Hanlon, MD  HYDROcodone-acetaminophen (NORCO) 7.5-325 MG tablet Take 1-2 tablets by mouth daily as needed for pain. 12/09/20   [provider]  ipratropium-albuterol (DUONEB) 0.5-2.5 (3) MG/3ML SOLN Take 3 mLs by nebulization every 6 (six) hours as needed. Patient taking differently: Take 3 mLs by nebulization every 6 (six) hours as needed (shortness of breath or wheezing). 10/17/19   Ledora Bottcher, PA  isosorbide mononitrate (IMDUR) 60 MG 24  hr tablet Take 1 tablet (60 mg total) by mouth daily. 09/04/20   Wouk, Ailene Rud, MD  lidocaine (LIDODERM) 5 % Place 1 patch onto the skin daily. Remove & Discard patch within 12 hours or as directed by MD 10/14/19   Flora Lipps, MD  lidocaine-prilocaine (EMLA) cream Apply 1 application topically as needed (prior to treatment).     [provider]  losartan (COZAAR) 50 MG tablet Take 50 mg by mouth daily. 10/10/20   [provider]  Melatonin 5 MG CHEW Chew 1 tablet by mouth at bedtime.    [provider]  mirtazapine (REMERON) 15 MG tablet Take 15 mg by mouth at bedtime. 02/25/20   [provider]  nitroGLYCERIN (NITROSTAT) 0.4 MG SL tablet Place 1 tablet (0.4 mg total) under the tongue every 5 (five) minutes as needed for chest pain. 09/02/20    Minna Merritts, MD  oxybutynin (DITROPAN XL) 15 MG 24 hr tablet Take 15 mg by mouth daily. 12/24/17   [provider]  pravastatin (PRAVACHOL) 40 MG tablet Take 40 mg by mouth at bedtime.     [provider]  senna-docusate (SENOKOT-S) 8.6-50 MG tablet Take 1 tablet by mouth daily.    [provider]  sertraline (ZOLOFT) 50 MG tablet Take 50 mg by mouth at bedtime. 03/02/20   [provider]  torsemide (DEMADEX) 20 MG tablet Take 40 mg by mouth daily. 09/16/20   [provider]   Physical Exam: Vitals:   12/14/2020 1720 11/25/2020 1900 12/08/2020 1930 12/07/2020 2000  BP:  (!) 161/65 (!) 164/59 (!) 158/57  Pulse:  88 87 88  Resp:  (!) 23 (!) 23 (!) 25  Temp:      TempSrc:      SpO2: 100% 100% 100% 99%  Weight:       Constitutional: appears age-appropriate, NAD, calm, comfortable Eyes: PERRL, lids and conjunctivae normal ENMT: Mucous membranes are moist. Posterior pharynx clear of any exudate or lesions. Age-appropriate dentition.  Mild hearing loss Neck: normal, supple, no masses, no thyromegaly Respiratory: Decreased breath sounds to auscultation bilaterally, no wheezing, no crackles. Normal respiratory effort. No accessory muscle use.  Cardiovascular: Regular rate and rhythm, no murmurs / rubs / gallops. No extremity edema. 2+ pedal pulses. No carotid bruits.  Abdomen: Obese abdomen, no tenderness, no masses palpated, no hepatosplenomegaly. Bowel sounds positive.  Musculoskeletal: no clubbing / cyanosis. No joint deformity upper and lower extremities. Good ROM, no contractures, no atrophy. Normal muscle tone.  Skin: no rashes, lesions, ulcers. No induration Neurologic: Sensation intact. Strength 5/5 in all 4.  Psychiatric: Normal judgment and insight. Alert and oriented x 3. Normal mood.   EKG: independently reviewed, showing sinus rhythm with rate of 93, QTc 472  Chest x-ray on Admission: I personally reviewed and I agree with radiologist  reading as below.  DG Chest 2 View  Result Date: 12/15/2020 CLINICAL DATA:  Increasing cough and shortness of breath since last night. COVID-19 positive 2 days ago. EXAM: CHEST - 2 VIEW COMPARISON:  12/13/2020, 08/30/2020 and 08/05/2020. FINDINGS: Poor inspiration. Grossly stable mildly enlarged cardiac silhouette. Mild diffuse prominence of the interstitial markings without Kerley lines, similar to 08/05/2020. Otherwise, clear lungs. Tortuous and calcified thoracic aorta. Diffuse osteopenia. Cholecystectomy clips. IMPRESSION: No acute abnormality.  Mild chronic interstitial lung disease. Electronically Signed   By: Claudie Revering M.D.   On: 11/21/2020 17:54   Labs on Admission: I have personally reviewed following labs  CBC: Recent Labs  Lab 12/13/20 0829 11/21/2020 1722  WBC 17.0* 7.3  NEUTROABS 13.4* 5.3  HGB 11.8* 12.0  HCT 38.3 37.6  MCV 90.8 89.7  PLT 331 [id number]   Basic Metabolic Panel: Recent Labs  Lab 12/13/20 0829 12/14/20 0518 12/14/2020 1811  NA 133* 137 138  K >7.5* 4.5 3.8  CL 90* 91* 89*  CO2 22 28 32  GLUCOSE 116* 87 120*  BUN 75* 47* 35*  CREATININE 9.13* 6.30* 5.51*  CALCIUM 8.1* 8.2* 7.4*   GFR: Estimated Creatinine Clearance: 8.1 mL/min (A) (by C-G formula based on SCr of 5.51 mg/dL (H)).  Liver Function Tests: Recent Labs  Lab 12/12/2020 1811  AST 27  ALT 17  ALKPHOS 80  BILITOT 1.4*  PROT 7.0  ALBUMIN 3.6   Urine analysis:    Component Value Date/Time   COLORURINE YELLOW (A) 12/04/2019 2054   APPEARANCEUR CLOUDY (A) 12/04/2019 2054   LABSPEC 1.008 12/04/2019 2054   PHURINE 8.0 12/04/2019 2054   GLUCOSEU NEGATIVE 12/04/2019 2054   HGBUR MODERATE (A) 12/04/2019 2054   BILIRUBINUR NEGATIVE 12/04/2019 2054   [place] NEGATIVE 12/04/2019 2054   PROTEINUR 100 (A) 12/04/2019 2054   NITRITE NEGATIVE 12/04/2019 2054   LEUKOCYTESUR LARGE (A) 12/04/2019 2054   Camrie Stock N Ahilyn Nell D.O. [facility]  If 7PM-7AM, please contact overnight-coverage  provider If 7AM-7PM, please contact day coverage provider [email or web address]  12/07/2020, 9:15 PM

## 2020-12-20 ENCOUNTER — Inpatient Hospital Stay: Payer: Medicare Other | Admitting: Internal Medicine

## 2020-12-20 ENCOUNTER — Ambulatory Visit: Payer: PRIVATE HEALTH INSURANCE | Admitting: Physician Assistant

## 2020-12-20 ENCOUNTER — Inpatient Hospital Stay: Payer: Medicare Other

## 2020-12-20 DIAGNOSIS — U071 COVID-19: Secondary | ICD-10-CM | POA: Diagnosis not present

## 2020-12-20 DIAGNOSIS — J9611 Chronic respiratory failure with hypoxia: Secondary | ICD-10-CM

## 2020-12-20 DIAGNOSIS — J189 Pneumonia, unspecified organism: Secondary | ICD-10-CM | POA: Diagnosis not present

## 2020-12-20 DIAGNOSIS — N186 End stage renal disease: Secondary | ICD-10-CM

## 2020-12-20 DIAGNOSIS — I5032 Chronic diastolic (congestive) heart failure: Secondary | ICD-10-CM

## 2020-12-20 LAB — CBC
HCT: 37.1 % (ref 36.0–46.0)
Hemoglobin: 11.6 g/dL — ABNORMAL LOW (ref 12.0–15.0)
MCH: 28.3 pg (ref 26.0–34.0)
MCHC: 31.3 g/dL (ref 30.0–36.0)
MCV: 90.5 fL (ref 80.0–100.0)
Platelets: 120 10*3/uL — ABNORMAL LOW (ref 150–400)
RBC: 4.1 MIL/uL (ref 3.87–5.11)
RDW: 14.4 % (ref 11.5–15.5)
WBC: 4.3 10*3/uL (ref 4.0–10.5)
nRBC: 0 % (ref 0.0–0.2)

## 2020-12-20 LAB — BASIC METABOLIC PANEL
Anion gap: 20 — ABNORMAL HIGH (ref 5–15)
BUN: 41 mg/dL — ABNORMAL HIGH (ref 8–23)
CO2: 29 mmol/L (ref 22–32)
Calcium: 7.6 mg/dL — ABNORMAL LOW (ref 8.9–10.3)
Chloride: 90 mmol/L — ABNORMAL LOW (ref 98–111)
Creatinine, Ser: 6.46 mg/dL — ABNORMAL HIGH (ref 0.44–1.00)
GFR, Estimated: 6 mL/min — ABNORMAL LOW (ref >60)
Glucose, Bld: 177 mg/dL — ABNORMAL HIGH (ref 70–99)
Potassium: 4.5 mmol/L (ref 3.5–5.1)
Sodium: 139 mmol/L (ref 135–145)

## 2020-12-20 LAB — RESPIRATORY PANEL BY PCR

## 2020-12-20 LAB — FERRITIN: Ferritin: 3084 ng/mL — ABNORMAL HIGH (ref 11–307)

## 2020-12-20 LAB — C-REACTIVE PROTEIN: CRP: 3.4 mg/dL — ABNORMAL HIGH (ref <1.0)

## 2020-12-20 LAB — GLUCOSE, CAPILLARY: Glucose-Capillary: 220 mg/dL — ABNORMAL HIGH (ref 70–99)

## 2020-12-20 LAB — FIBRIN DERIVATIVES D-DIMER (ARMC ONLY): Fibrin derivatives D-dimer (ARMC): 1424.48 ng/mL (FEU) — ABNORMAL HIGH (ref 0.00–499.00)

## 2020-12-20 LAB — TROPONIN I (HIGH SENSITIVITY): Troponin I (High Sensitivity): 141 ng/L (ref <18)

## 2020-12-20 MED ORDER — SODIUM CHLORIDE 0.9 % IV SOLN
1.0000 g | INTRAVENOUS | Status: DC
  Administered 2020-12-20: 1 g via INTRAVENOUS
  Filled 2020-12-20 (×2): qty 10

## 2020-12-20 MED ORDER — SODIUM CHLORIDE 0.9 % IV SOLN
500.0000 mg | INTRAVENOUS | Status: DC
  Administered 2020-12-20: 500 mg via INTRAVENOUS
  Filled 2020-12-20: qty 500

## 2020-12-20 MED ORDER — SODIUM CHLORIDE 0.9 % IV SOLN
100.0000 mg | Freq: Two times a day (BID) | INTRAVENOUS | Status: DC
  Filled 2020-12-20 (×2): qty 100

## 2020-12-21 NOTE — ED Notes (Signed)
Report to ally yow, rn.

## 2020-12-21 NOTE — ED Notes (Signed)
Report from [place], rn.

## 2020-12-21 NOTE — Progress Notes (Signed)
Entered pt room to give medications and found pt unresponsive and pulseless.  Code blue called and CPR started. Last seen well approx one hour prior while pt was eating lunch. See code blue paper charting.

## 2020-12-21 NOTE — ED Notes (Signed)
Pt sleeping, resps unlabored. Cardiac monitor in place.

## 2020-12-21 NOTE — ED Notes (Signed)
Pt sleeping, siderails up, bed in low position.

## 2020-12-21 NOTE — Progress Notes (Signed)
HCPOA Marlene Lard called. Requested funeral home info at this time but she was unsure and will call back with that information.

## 2020-12-21 NOTE — ED Notes (Signed)
Meal tray delivered at this time. 

## 2020-12-21 NOTE — Discharge Summary (Signed)
Death Summary  Phyllis [name] [id number] DOB: [birth year] DOA: 12/29/2020  PCP: [name], MD  Admit date: 2020-12-29 Date of Death: 30-Dec-2020 Time of Death: 41   History of present illness:  Cardiac arrest COVID-19 pneumonia Possible bacterial pneumonia Chronic interstitial lung disease Chronic hypoxic respiratory failure Mildly elevated troponin in a patient with severe coronary artery disease and moderate to severe aortic stenosis Chronic diastolic heart failure Hyperlipidemia Hypertension Depression/anxiety Chronic pain OSA  Final Diagnoses:  75 year old female with history of end-stage renal disease on hemodialysis, basal cell carcinoma of the forearm x3, chronic pain, asthma, chronic hypoxic respiratory failure on 2 L oxygen via nasal cannula.  Chronic back pain, GERD, gout, diabetes mellitus type 2, hypertension, peripheral neuropathy, obstructive sleep apnea, secondary hyperparathyroidism, chronic interstitial disease, depression/anxiety presented with worsening shortness of breath.  She tested positive for Covid on 12/17/2020 as an outpatient. On presentation, chest x-ray showed mild chronic interstitial lung disease.  She was started on Decadron and remdesivir.  She was also started on antibiotics for elevated procalcitonin level.  Nephrology was consulted.  Given the hospitalization, she suffered from cardiac arrest for which ACLS was performed with CPR and meds but she passed away at 1534 on 12/30/2020.  Healthcare power of attorney Marlene Lard was notified.  The results of significant diagnostics from this hospitalization (including imaging, microbiology, ancillary and laboratory) are listed below for reference.    Significant Diagnostic Studies: DG Chest 2 View  Result Date: 2020/12/29 CLINICAL DATA:  Increasing cough and shortness of breath since last night. COVID-19 positive 2 days ago. EXAM: CHEST - 2 VIEW COMPARISON:  12/13/2020, 08/30/2020 and  08/05/2020. FINDINGS: Poor inspiration. Grossly stable mildly enlarged cardiac silhouette. Mild diffuse prominence of the interstitial markings without Kerley lines, similar to 08/05/2020. Otherwise, clear lungs. Tortuous and calcified thoracic aorta. Diffuse osteopenia. Cholecystectomy clips. IMPRESSION: No acute abnormality.  Mild chronic interstitial lung disease. Electronically Signed   By: Claudie Revering M.D.   On: 2020/12/29 17:54   CT SOFT TISSUE NECK WO CONTRAST  Result Date: 12/01/2020 CLINICAL DATA:  Left ear pain 3-4 weeks.  Tinnitus. EXAM: CT NECK WITHOUT CONTRAST TECHNIQUE: Multidetector CT imaging of the neck was performed following the standard protocol without intravenous contrast. COMPARISON:  CT angio head neck 01/17/2018 FINDINGS: Pharynx and larynx: Tonsilliths bilaterally. No pharyngeal mass or edema. Normal larynx. Salivary glands: No inflammation, mass, or stone. Thyroid: Bilateral thyroid nodules noted on the prior enhanced study. These are not visualized on the unenhanced study. Thyroid gland normal in size Lymph nodes: Negative for enlarged lymph nodes in the neck. Vascular: Limited vascular evaluation without intravenous contrast. There is extensive atherosclerotic disease in the aortic arch and carotid arteries bilaterally. Vessel patency not assessed. Limited intracranial: Negative Visualized orbits: Negative Mastoids and visualized paranasal sinuses: Chronic mucoperiosteal thickening left sphenoid sinus. Remaining paranasal sinuses clear. Mastoid sinus and middle ear clear bilaterally. Skeleton: Negative Upper chest: Mild hazy ground-glass density in both lung apices similar to that seen on the prior study. Other: None IMPRESSION: Negative for mass or adenopathy in the neck. Mastoid sinus and middle ear clear bilaterally. Mucoperiosteal thickening in the sphenoid sinus. Bilateral tonsilliths.  No peritonsillar mass or abscess. Advanced atherosclerotic calcification aorta and carotid  arteries bilaterally Bilateral thyroid nodules best seen on the prior enhanced CT. Electronically Signed   By: Franchot Gallo M.D.   On: 12/01/2020 15:46   DG Chest Portable 1 View  Result Date: 12/13/2020 CLINICAL DATA:  Respiratory distress, shortness of breath at  dialysis missed dialysis Saturday and today EXAM: PORTABLE CHEST 1 VIEW COMPARISON:  Chest radiograph August 30, 2020 FINDINGS: The heart size and mediastinal contours are enlarged. Diffuse interstitial opacity with bibasilar predominant alveolar opacities no significant pleural effusion or pneumothorax. The visualized skeletal structures are unremarkable. IMPRESSION: Diffuse interstitial opacity with bibasilar predominant alveolar opacities, which likely reflects pulmonary edema. Electronically Signed   By: Dahlia Bailiff MD   On: 12/13/2020 09:05   ECHOCARDIOGRAM COMPLETE  Result Date: 12/13/2020    ECHOCARDIOGRAM REPORT   Patient Name:   Phyllis [name] Date of Exam: 12/13/2020 Medical Rec #:  [id number]           Height:       58.0 in Accession #:    [id number]          Weight:       180.0 lb Date of Birth:  [birth year]            BSA:          1.741 m Patient Age:    22 years            BP:           112/49 mmHg Patient Gender: F                   HR:           60 bpm. Exam Location:  [facility] Procedure: 2D Echo, Color Doppler and Cardiac Doppler Indications:     I50.31 CHF-Acute Diastolic  History:         Patient has prior history of Echocardiogram examinations, most                  recent 09/01/2020. HFpEF, CAD, ESRD; Risk Factors:Sleep Apnea,                  Hypertension and Dyslipidemia.  Sonographer:     Charmayne Sheer RDCS (AE) Referring Phys:  [id number] Collier Bullock Diagnosing Phys: Kathlyn Sacramento MD  Sonographer Comments: Suboptimal apical window and no subcostal window. IMPRESSIONS  1. Left ventricular ejection fraction, by estimation, is 50 to 55%. The left ventricle has low normal function. The left ventricle has no regional wall  motion abnormalities. There is mild left ventricular hypertrophy. Left ventricular diastolic parameters are indeterminate.  2. Right ventricular systolic function is normal. The right ventricular size is normal. Tricuspid regurgitation signal is inadequate for assessing PA pressure.  3. Left atrial size was mildly dilated.  4. The mitral valve is normal in structure. Trivial mitral valve regurgitation. No evidence of mitral stenosis. Moderate mitral annular calcification.  5. The aortic valve is normal in structure. Aortic valve regurgitation is not visualized. Moderate to severe aortic valve stenosis. Aortic valve area, by VTI measures 0.74 cm. Aortic valve mean gradient measures 18.0 mmHg. FINDINGS  Left Ventricle: Left ventricular ejection fraction, by estimation, is 50 to 55%. The left ventricle has low normal function. The left ventricle has no regional wall motion abnormalities. The left ventricular internal cavity size was normal in size. There is mild left ventricular hypertrophy. Left ventricular diastolic parameters are indeterminate. Right Ventricle: The right ventricular size is normal. No increase in right ventricular wall thickness. Right ventricular systolic function is normal. Tricuspid regurgitation signal is inadequate for assessing PA pressure. Left Atrium: Left atrial size was mildly dilated. Right Atrium: Right atrial size was normal in size. Pericardium: There is no evidence of pericardial effusion. Mitral Valve: The mitral valve is  normal in structure. Moderate mitral annular calcification. Trivial mitral valve regurgitation. No evidence of mitral valve stenosis. MV peak gradient, 7.1 mmHg. The mean mitral valve gradient is 2.0 mmHg. Tricuspid Valve: The tricuspid valve is normal in structure. Tricuspid valve regurgitation is trivial. No evidence of tricuspid stenosis. Aortic Valve: The aortic valve is normal in structure. Aortic valve regurgitation is not visualized. Moderate to severe  aortic stenosis is present. Aortic valve mean gradient measures 18.0 mmHg. Aortic valve peak gradient measures 34.3 mmHg. Aortic valve  area, by VTI measures 0.74 cm. Pulmonic Valve: The pulmonic valve was normal in structure. Pulmonic valve regurgitation is trivial. No evidence of pulmonic stenosis. Aorta: The aortic root is normal in size and structure. Venous: The inferior vena cava was not well visualized. IAS/Shunts: No atrial level shunt detected by color flow Doppler.  LEFT VENTRICLE PLAX 2D LVIDd:         3.66 cm LVIDs:         2.86 cm LV PW:         1.07 cm LV IVS:        0.99 cm LVOT diam:     2.10 cm LV SV:         42 LV SV Index:   24 LVOT Area:     3.46 cm  LEFT ATRIUM             Index LA diam:        3.30 cm 1.90 cm/m LA Vol (A2C):   38.2 ml 21.94 ml/m LA Vol (A4C):   65.6 ml 37.67 ml/m LA Biplane Vol: 53.7 ml 30.84 ml/m  AORTIC VALVE                    PULMONIC VALVE AV Area (Vmax):    0.79 cm     PV Vmax:       0.67 m/s AV Area (Vmean):   0.71 cm     PV Vmean:      47.400 cm/s AV Area (VTI):     0.74 cm     PV VTI:        0.145 m AV Vmax:           293.00 cm/s  PV Peak grad:  1.8 mmHg AV Vmean:          199.000 cm/s PV Mean grad:  1.0 mmHg AV VTI:            0.569 m AV Peak Grad:      34.3 mmHg AV Mean Grad:      18.0 mmHg LVOT Vmax:         66.70 cm/s LVOT Vmean:        40.900 cm/s LVOT VTI:          0.121 m LVOT/AV VTI ratio: 0.21  AORTA Ao Root diam: 2.90 cm MITRAL VALVE MV Area (PHT): 3.18 cm     SHUNTS MV Area VTI:   1.51 cm     Systemic VTI:  0.12 m MV Peak grad:  7.1 mmHg     Systemic Diam: 2.10 cm MV Mean grad:  2.0 mmHg MV Vmax:       1.33 m/s MV Vmean:      57.8 cm/s MV Decel Time: 239 msec MV E velocity: 145.50 cm/s MV A velocity: 30.60 cm/s MV E/A ratio:  4.75 Kathlyn Sacramento MD Electronically signed by Kathlyn Sacramento MD Signature Date/Time: 12/13/2020/3:04:48 PM    Final  Microbiology: Recent Results (from the past 240 hour(s))  C Difficile Quick Screen w PCR reflex      Status: None   Collection Time: 12/13/20  6:18 PM   Specimen: STOOL  Result Value Ref Range Status   C Diff antigen NEGATIVE NEGATIVE Final   C Diff toxin NEGATIVE NEGATIVE Final   C Diff interpretation No C. difficile detected.  Final    Comment: Performed at [facility], [place], [place] [address]  Respiratory (~20 pathogens) panel by PCR     Status: None   Collection Time: 12/11/2020 10:15 PM   Specimen: Nasopharyngeal Swab; Respiratory  Result Value Ref Range Status   Adenovirus NOT DETECTED NOT DETECTED Final   Coronavirus 229E NOT DETECTED NOT DETECTED Final    Comment: (NOTE) The Coronavirus on the Respiratory Panel, DOES NOT test for the novel  Coronavirus (2019 nCoV)    Coronavirus HKU1 NOT DETECTED NOT DETECTED Final   Coronavirus NL63 NOT DETECTED NOT DETECTED Final   Coronavirus OC43 NOT DETECTED NOT DETECTED Final   Metapneumovirus NOT DETECTED NOT DETECTED Final   Rhinovirus / Enterovirus NOT DETECTED NOT DETECTED Final   Influenza A NOT DETECTED NOT DETECTED Final   Influenza B NOT DETECTED NOT DETECTED Final   Parainfluenza Virus 1 NOT DETECTED NOT DETECTED Final   Parainfluenza Virus 2 NOT DETECTED NOT DETECTED Final   Parainfluenza Virus 3 NOT DETECTED NOT DETECTED Final   Parainfluenza Virus 4 NOT DETECTED NOT DETECTED Final   Respiratory Syncytial Virus NOT DETECTED NOT DETECTED Final   Bordetella pertussis NOT DETECTED NOT DETECTED Final   Bordetella Parapertussis NOT DETECTED NOT DETECTED Final   Chlamydophila pneumoniae NOT DETECTED NOT DETECTED Final   Mycoplasma pneumoniae NOT DETECTED NOT DETECTED Final    Comment: Performed at [facility], [place]. [address] [place], [place] [address]     Labs: Basic Metabolic Panel: Recent Labs  Lab 12/14/20 0518 11/24/2020 1811 Dec 31, 2020 0230  NA 137 138 139  K 4.5 3.8 4.5  CL 91* 89* 90*  CO2 28 32 29  GLUCOSE 87 120* 177*  BUN 47* 35* 41*  CREATININE 6.30* 5.51* 6.46*  CALCIUM  8.2* 7.4* 7.6*   Liver Function Tests: Recent Labs  Lab 12/18/2020 1811  AST 27  ALT 17  ALKPHOS 80  BILITOT 1.4*  PROT 7.0  ALBUMIN 3.6   No results for input(s): LIPASE, AMYLASE in the last 168 hours. No results for input(s): AMMONIA in the last 168 hours. CBC: Recent Labs  Lab 12/10/2020 1722 December 31, 2020 0230  WBC 7.3 4.3  NEUTROABS 5.3  --   HGB 12.0 11.6*  HCT 37.6 37.1  MCV 89.7 90.5  PLT 141* 120*   Cardiac Enzymes: No results for input(s): CKTOTAL, CKMB, CKMBINDEX, TROPONINI in the last 168 hours. D-Dimer No results for input(s): DDIMER in the last 72 hours. BNP: Invalid input(s): POCBNP CBG: Recent Labs  Lab December 31, 2020 1518  GLUCAP 220*   Anemia work up Recent Labs    31-Dec-2020 0857  FERRITIN 3,084*   Urinalysis    Component Value Date/Time   COLORURINE YELLOW (A) 12/04/2019 2054   APPEARANCEUR CLOUDY (A) 12/04/2019 2054   LABSPEC 1.008 12/04/2019 2054   PHURINE 8.0 12/04/2019 2054   GLUCOSEU NEGATIVE 12/04/2019 2054   HGBUR MODERATE (A) 12/04/2019 2054   BILIRUBINUR NEGATIVE 12/04/2019 2054   Walker 12/04/2019 2054   PROTEINUR 100 (A) 12/04/2019 2054   NITRITE NEGATIVE 12/04/2019 2054   LEUKOCYTESUR  LARGE (A) 12/04/2019 2054   Sepsis Labs Invalid input(s): PROCALCITONIN,  WBC,  LACTICIDVEN     SIGNED:  Aline August, MD  [facility] Jan 08, 2021, 3:58 PM

## 2020-12-21 NOTE — Progress Notes (Signed)
Patient ID: Phyllis [name], female   DOB: 02-08-1946, 75 y.o.   MRN: [id number]  PROGRESS NOTE    Phyllis [name]  [id number] DOB: Jun 08, 1946 DOA: 11/28/2020 PCP: [name], MD   Brief Narrative:  75 year old female with history of end-stage renal disease on hemodialysis, basal cell carcinoma of the forearm x3, chronic pain, asthma, chronic hypoxic respiratory failure on 2 L oxygen via nasal cannula.  Chronic back pain, GERD, gout, diabetes mellitus type 2, hypertension, peripheral neuropathy, obstructive sleep apnea, secondary hyperparathyroidism, chronic interstitial disease, depression/anxiety presented with worsening shortness of breath.  She tested positive for Covid on 12/17/2020 as an outpatient. On presentation, chest x-ray showed mild chronic interstitial lung disease.  She was started on Decadron and remdesivir.  Assessment & Plan:   COVID-19 pneumonia Possible bacterial pneumonia as well Chronic interstitial lung disease Chronic hypoxic respiratory failure -Patient uses 2 L nasal cannula oxygen at home.  Presented with worsening shortness of breath and was febrile to 101.1 on presentation.  Currently on 2 L oxygen as well.  Chest x-ray showed mild chronic interstitial lung disease -Tested positive for Covid on 12/17/2020.  Respiratory panel PCR negative on presentation -Continue remdesivir and Decadron.  Will check inflammatory markers and monitor them daily. -Add inhalers.  Incentive spirometry -Procalcitonin elevated on admission: will start antibiotics  Mildly elevated troponin in a patient with severe coronary artery disease and moderate to severe aortic stenosis Chronic diastolic heart failure -Probably from demand ischemia..  Troponins did not trend up significantly.  No chest pain.  Echo on 12/13/2020 showed EF of 50 to 55% with indeterminate left ventricular diastolic parameters.  Had recent cardiology evaluation on 12/13/2020 with recommended outpatient  follow-up.  As per cardiology, patient is not a candidate for PCI/surgery or TAVR -Palliative care consultation for goals of care discussion - volume managed by dialysis.  Strict input and output.  Daily weights.  Continue Coreg, isosorbide mononitrate, statin  Hyperlipidemia -Continue statin  Hypertension -Monitor blood pressure.  Continue antihypertensives as above. -Also on losartan and amlodipine at home  Depression/anxiety Chronic pain Query fibromyalgia -Continue BuSpar, sertraline and Remeron  Chronic left shoulder pain -Status post triamcinolone injection intra-articular on 12/08/2020  OSA -Continue CPAP nightly  Generalized deconditioning -PT eval    DVT prophylaxis: Heparin Code Status: Full Family Communication: None Disposition Plan: Status is: Observation  The patient will require care spanning > 2 midnights and should be moved to inpatient because: Inpatient level of care appropriate due to severity of illness.  Still short of breath and will require intravenous antibiotics as well  Dispo: The patient is from: Home              Anticipated d/c is to: Home              Anticipated d/c date is: 2 days              Patient currently is not medically stable to d/c.   Difficult to place patient No  Consultants: Nephrology  Procedures: None  Antimicrobials: will start antibiotics   Subjective: Patient seen and examined at bedside.  Feels slightly better but still complains of cough and shortness of breath.  No overnight fever, chest pain or palpitations reported  Objective: Vitals:   2021/01/15 0630 January 15, 2021 0730 15-Jan-2021 0800 01-15-2021 1000  BP: (!) 150/53 (!) 137/52 (!) 142/54 (!) 148/68  Pulse: 66 66 68 80  Resp: 18 (!) 25 (!) 26 17  Temp:  TempSrc:      SpO2: 99% 98% 99% 100%  Weight:       No intake or output data in the 24 hours ending 2021-01-15 1033 Filed Weights   12/03/2020 1717  Weight: 81.6 kg    Examination:  General exam: Appears  calm and comfortable.  Looks chronically ill.  Currently on 2 L oxygen via nasal cannula. Respiratory system: Bilateral decreased breath sounds at bases with scattered crackles, intermittent tachypnea Cardiovascular system: S1 & S2 heard, Rate controlled Gastrointestinal system: Abdomen is nondistended, soft and nontender. Normal bowel sounds heard. Extremities: No cyanosis, clubbing; trace lower extremity edema  Attending Central nervous system: Alert and oriented.  Slow to respond.  Poor historian.  No focal neurological deficits. Moving extremities Skin: No rashes, lesions or ulcers Psychiatry: Flat affect    Data Reviewed: I have personally reviewed following labs and imaging studies  CBC: Recent Labs  Lab 12/11/2020 1722 2021/01/15 0230  WBC 7.3 4.3  NEUTROABS 5.3  --   HGB 12.0 11.6*  HCT 37.6 37.1  MCV 89.7 90.5  PLT 141* 123456*   Basic Metabolic Panel: Recent Labs  Lab 12/14/20 0518 12/15/2020 1811 01/15/2021 0230  NA 137 138 139  K 4.5 3.8 4.5  CL 91* 89* 90*  CO2 28 32 29  GLUCOSE 87 120* 177*  BUN 47* 35* 41*  CREATININE 6.30* 5.51* 6.46*  CALCIUM 8.2* 7.4* 7.6*   GFR: Estimated Creatinine Clearance: 6.9 mL/min (A) (by C-G formula based on SCr of 6.46 mg/dL (H)). Liver Function Tests: Recent Labs  Lab 12/18/2020 1811  AST 27  ALT 17  ALKPHOS 80  BILITOT 1.4*  PROT 7.0  ALBUMIN 3.6   No results for input(s): LIPASE, AMYLASE in the last 168 hours. No results for input(s): AMMONIA in the last 168 hours. Coagulation Profile: No results for input(s): INR, PROTIME in the last 168 hours. Cardiac Enzymes: No results for input(s): CKTOTAL, CKMB, CKMBINDEX, TROPONINI in the last 168 hours. BNP (last 3 results) No results for input(s): PROBNP in the last 8760 hours. HbA1C: No results for input(s): HGBA1C in the last 72 hours. CBG: No results for input(s): GLUCAP in the last 168 hours. Lipid Profile: No results for input(s): CHOL, HDL, LDLCALC, TRIG, CHOLHDL,  LDLDIRECT in the last 72 hours. Thyroid Function Tests: Recent Labs    12/07/2020 2113  TSH 1.163   Anemia Panel: Recent Labs    Jan 15, 2021 0857  FERRITIN 3,084*   Sepsis Labs: Recent Labs  Lab 12/18/2020 1923  PROCALCITON 5.23    Recent Results (from the past 240 hour(s))  C Difficile Quick Screen w PCR reflex     Status: None   Collection Time: 12/13/20  6:18 PM   Specimen: STOOL  Result Value Ref Range Status   C Diff antigen NEGATIVE NEGATIVE Final   C Diff toxin NEGATIVE NEGATIVE Final   C Diff interpretation No C. difficile detected.  Final    Comment: Performed at [facility], [place], [place] [address]  Respiratory (~20 pathogens) panel by PCR     Status: None   Collection Time: 11/28/2020 10:15 PM   Specimen: Nasopharyngeal Swab; Respiratory  Result Value Ref Range Status   Adenovirus NOT DETECTED NOT DETECTED Final   Coronavirus 229E NOT DETECTED NOT DETECTED Final    Comment: (NOTE) The Coronavirus on the Respiratory Panel, DOES NOT test for the novel  Coronavirus (2019 nCoV)    Coronavirus HKU1 NOT DETECTED NOT DETECTED Final  Coronavirus NL63 NOT DETECTED NOT DETECTED Final   Coronavirus OC43 NOT DETECTED NOT DETECTED Final   Metapneumovirus NOT DETECTED NOT DETECTED Final   Rhinovirus / Enterovirus NOT DETECTED NOT DETECTED Final   Influenza A NOT DETECTED NOT DETECTED Final   Influenza B NOT DETECTED NOT DETECTED Final   Parainfluenza Virus 1 NOT DETECTED NOT DETECTED Final   Parainfluenza Virus 2 NOT DETECTED NOT DETECTED Final   Parainfluenza Virus 3 NOT DETECTED NOT DETECTED Final   Parainfluenza Virus 4 NOT DETECTED NOT DETECTED Final   Respiratory Syncytial Virus NOT DETECTED NOT DETECTED Final   Bordetella pertussis NOT DETECTED NOT DETECTED Final   Bordetella Parapertussis NOT DETECTED NOT DETECTED Final   Chlamydophila pneumoniae NOT DETECTED NOT DETECTED Final   Mycoplasma pneumoniae NOT DETECTED NOT DETECTED Final     Comment: Performed at [facility], [place] [address] [place], [place] [address]         Radiology Studies: DG Chest 2 View  Result Date: 12/18/2020 CLINICAL DATA:  Increasing cough and shortness of breath since last night. COVID-19 positive 2 days ago. EXAM: CHEST - 2 VIEW COMPARISON:  12/13/2020, 08/30/2020 and 08/05/2020. FINDINGS: Poor inspiration. Grossly stable mildly enlarged cardiac silhouette. Mild diffuse prominence of the interstitial markings without Kerley lines, similar to 08/05/2020. Otherwise, clear lungs. Tortuous and calcified thoracic aorta. Diffuse osteopenia. Cholecystectomy clips. IMPRESSION: No acute abnormality.  Mild chronic interstitial lung disease. Electronically Signed   By: Claudie Revering M.D.   On: 11/20/2020 17:54        Scheduled Meds: . amLODipine  5 mg Oral Daily  . aspirin EC  81 mg Oral Daily  . busPIRone  5 mg Oral BID  . calcium acetate  1,334 mg Oral TID WC  . carvedilol  3.125 mg Oral BID WC  . dexamethasone (DECADRON) injection  10 mg Intravenous Q24H  . heparin  5,000 Units Subcutaneous Q8H  . isosorbide mononitrate  60 mg Oral Daily  . melatonin  5 mg Oral QHS  . mirtazapine  15 mg Oral QHS  . oxybutynin  15 mg Oral Daily  . polyethylene glycol  17 g Oral BID  . pravastatin  40 mg Oral QHS  . sertraline  50 mg Oral QHS   Continuous Infusions: . azithromycin Stopped (01/13/21 0950)  . cefTRIAXone (ROCEPHIN)  IV Stopped (01/13/2021 [id number])  . [START ON 12/21/2020] remdesivir 100 mg in NS 100 mL            Aline August, MD [facility] 01/13/21, 10:33 AM
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
## 2020-12-21 NOTE — ED Notes (Signed)
Warm blanket provided.

## 2020-12-21 NOTE — ED Notes (Signed)
Pt states that she thinks the ABX are making her feel nauseated and hot. Stopped ABX and will given PRN nausea medication. So rash or any signs of allergic reaction noted.

## 2020-12-21 NOTE — Progress Notes (Signed)
   January 13, 2021 1515  Clinical Encounter Type  Visited With Other (Comment) (I responded to a code blue for the patient)   I responded to a code blue for Phyllis [name]. There was no family present. I let the staff know if they needed anything to page the on call chaplain.   [place], [place]

## 2020-12-21 NOTE — ED Notes (Addendum)
Sent Dr. Starla Link a message regarding pt response to the azithromycin. States he will discontinue the order.

## 2020-12-21 DEATH — deceased

## 2021-01-03 ENCOUNTER — Ambulatory Visit: Payer: Medicare Other | Admitting: Cardiovascular Disease

## 2021-01-11 ENCOUNTER — Telehealth: Payer: Self-pay

## 2021-01-11 NOTE — Telephone Encounter (Signed)
Opened in error

## 2021-01-18 NOTE — ED Provider Notes (Signed)
[facility] Department of Emergency Medicine   Code Blue CONSULT NOTE  Chief Complaint: Cardiac arrest/unresponsive   Level V Caveat: Unresponsive  History of present illness: I was contacted by the hospital for a CODE BLUE cardiac arrest upstairs and presented to the patient's bedside.   ROS: Unable to obtain, Level V caveat  Scheduled Meds: Continuous Infusions: PRN Meds:. Past Medical History:  Diagnosis Date  . (HFpEF) heart failure with preserved ejection fraction ([place])    a. TTE 01/2014: nl LV sys fxn, no valvular abnormalities; b. TTE 11/16: nl EF, mild LVH;  c. 04/2019 Echo: EF 60-65%. DD; d. 08/2020 Echo: EF 55-60%, no rwma, mild LVH, gr2 DD, nl RV size/fxn, mod dil LA, mildly dil RA, mild MR. Mod to sev AS (AoV area 0.8cm^2)  . Allergy   . Anemia of chronic disease   . Anxiety   . Aortic atherosclerosis ([place])   . Asthma   . CAD (coronary artery disease)    a. 09/2019 Cath: LM 80ost/m, 99d, LAD 99ost, 70p/m, LCX 65ost/p, OM2 70, RCA nl-->poor PCI/surgical candidate-->med Rx.  . Chronic back pain   . COPD (chronic obstructive pulmonary disease) ([place])   . COVID-19 virus infection 09/2019  . Diabetes mellitus with complication ([place])   . ESRD on hemodialysis ([place])    a. Tues/Sat; b. 2/2 small kidneys  . Essential hypertension   . Fistula    lower left arm  . GERD (gastroesophageal reflux disease)   . Gout   . History of exercise stress test    a. 01/2014: no evidence of ischemia; b. Lexiscan 08/2015: no sig ischemia, severe GI uptake artifact, low risk; c. CPX @ [facility] 09/2016: exercised 3 min 12 sec on bike without incline, 2.28 METs, VO2 of 8.1, 48% of predicted, indicating mod to sev functional impairment, evidence of blunted HR, stroke volume, and BP augmentation as well as ventilation-perfusion mismatch with exercise  . HLD (hyperlipidemia)   . Moderate to Severe aortic stenosis    a. 04/2019 Echo: Mod AS;  c. 08/2020 Echo: Mod to sev AS (AoV area 0.8cm^2).  . Non-obstructive Carotid arterial disease ([place])    a. 12/2017: <50% bilat ICA dzs.  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 Sleep apnea    Past Surgical History:  Procedure Laterality Date  . CARDIAC CATHETERIZATION    . carpel tunnel    . GALLBLADDER SURGERY    . PERIPHERAL VASCULAR CATHETERIZATION N/A 04/12/2015   Procedure: A/V Shuntogram/Fistulagram;  Surgeon: Algernon Huxley, MD;  Location: [place] [facility];  Service: Cardiovascular;  Laterality: N/A;  . PERIPHERAL VASCULAR CATHETERIZATION N/A 04/12/2015   Procedure: A/V Shunt Intervention;  Surgeon: Algernon Huxley, MD;  Location: [place] [facility];  Service: Cardiovascular;  Laterality: N/A;  . PERIPHERAL VASCULAR CATHETERIZATION N/A 06/09/2015   Procedure: Dialysis/Perma Catheter Removal;  Surgeon: Katha Cabal, MD;  Location: [place] [facility];  Service: Cardiovascular;  Laterality: N/A;  . RIGHT/LEFT HEART CATH AND CORONARY ANGIOGRAPHY N/A 10/14/2019   Procedure: RIGHT/LEFT HEART CATH AND CORONARY ANGIOGRAPHY;  Surgeon: Belva Crome, MD;  Location: [place] [facility];  Service: Cardiovascular;  Laterality: N/A;   Social History   Socioeconomic History  . Marital status: Single    Spouse name: Not on file  . Number of children: Not on file  . Years of education: Not on file  . Highest education  level: Not on file  Occupational History  . Not on file  Tobacco Use  . Smoking status: Never Smoker  . Smokeless tobacco: Never Used  Vaping Use  . Vaping Use: Never used  Substance and Sexual Activity  . Alcohol use: No  . Drug use: No  . Sexual activity: Not Currently  Other Topics Concern  . 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 Taught school. Nephew in next relative in [place].    Social Determinants of Health   Financial Resource Strain: Not on file  Food  Insecurity: Not on file  Transportation Needs: Not on file  Physical Activity: Not on file  Stress: Not on file  Social Connections: Not on file  Intimate Partner Violence: Not on file   Allergies  Allergen Reactions  . Enalapril Maleate Other (See Comments)    Other reaction(s): Headache  . Nitrofurantoin Swelling and Rash    Other Reaction: swelling of body  . Sulfamethoxazole-Trimethoprim Swelling  . 2,4-D Dimethylamine (Amisol) Rash and Other (See Comments)    Other Reaction: h/a  . Baclofen Other (See Comments) and Nausea Only    lightheadness ,drowsiness , muscle weakness , twitching in hands   . Bactrim [Sulfamethoxazole-Trimethoprim] Swelling  . Macrodantin [Nitrofurantoin Macrocrystal] Swelling  . Neosporin [Neomycin-Bacitracin Zn-Polymyx] Other (See Comments) and Rash    Other Reaction: irritation Skin irritation   . Quinine Nausea And Vomiting, Rash and Other (See Comments)    Other Reaction: Vomiting, rash, h/a, vision  . Quinine Derivatives Nausea And Vomiting and Other (See Comments)    Vertigo, Headache   . Ultram [Tramadol] Palpitations  . Zocor [Simvastatin] Other (See Comments) and Rash    Other Reaction: muscle spasms Muscle pain and spasms  . Levodopa Other (See Comments)    Reaction: unknown    Last set of Vital Signs (not current) Vitals:   18-Jan-2021 1130 01-18-2021 1248  BP: (!) 159/55 (!) 152/69  Pulse: 82 86  Resp: (!) 23 20  Temp:  97.8 F (36.6 C)  SpO2: 99% 100%      Physical Exam Gen: unresponsive Cardiovascular: pulseless  Resp: apneic. Breath sounds equal bilaterally with bagging  Abd: nondistended  Neuro: GCS 3, unresponsive to pain  HEENT: No blood in posterior pharynx, gag reflex absent  Neck: No crepitus  Musculoskeletal: No deformity  Skin: warm  Procedures (when applicable, including Critical Care time): Procedure Name: Intubation Date/Time: 12/21/2020 10:15 AM Performed by: Lucrezia Starch, MD Pre-anesthesia Checklist:  Emergency Drugs available, Suction available and Patient identified Oxygen Delivery Method: Ambu bag Preoxygenation: Pre-oxygenation with 100% oxygen Laryngoscope Size: Glidescope Tube size: 7.0 mm Number of attempts: 2 Airway Equipment and Method: Video-laryngoscopy Placement Confirmation: ETT inserted through vocal cords under direct vision,  Positive ETCO2 and Breath sounds checked- equal and bilateral Tube secured with: ETT holder        MDM / Assessment and Plan ACLS performed with medications administered per [facility]. Glucose WNL. Rhythm asystole throughout CPR.Time of death called at 31. Family notified by hospitalist     Lucrezia Starch, MD 12/21/20 1017

## 2021-06-22 ENCOUNTER — Other Ambulatory Visit: Payer: Medicare Other

## 2021-06-28 ENCOUNTER — Other Ambulatory Visit: Payer: Medicare Other
